# Patient Record
Sex: Female | Born: 1945 | Race: White | Hispanic: No | Smoking: Former smoker
Health system: Southern US, Community
[De-identification: ages and names within clinical notes are randomized; demographics above are authoritative.]

## PROBLEM LIST (undated history)

## (undated) DIAGNOSIS — K449 Diaphragmatic hernia without obstruction or gangrene: Secondary | ICD-10-CM

## (undated) DIAGNOSIS — E785 Hyperlipidemia, unspecified: Secondary | ICD-10-CM

## (undated) DIAGNOSIS — K902 Blind loop syndrome, not elsewhere classified: Secondary | ICD-10-CM

## (undated) DIAGNOSIS — G4730 Sleep apnea, unspecified: Secondary | ICD-10-CM

## (undated) DIAGNOSIS — I739 Peripheral vascular disease, unspecified: Secondary | ICD-10-CM

## (undated) DIAGNOSIS — E041 Nontoxic single thyroid nodule: Secondary | ICD-10-CM

## (undated) DIAGNOSIS — E538 Deficiency of other specified B group vitamins: Secondary | ICD-10-CM

## (undated) DIAGNOSIS — I10 Essential (primary) hypertension: Secondary | ICD-10-CM

## (undated) DIAGNOSIS — M1990 Unspecified osteoarthritis, unspecified site: Secondary | ICD-10-CM

## (undated) DIAGNOSIS — F1910 Other psychoactive substance abuse, uncomplicated: Secondary | ICD-10-CM

## (undated) DIAGNOSIS — R160 Hepatomegaly, not elsewhere classified: Secondary | ICD-10-CM

## (undated) DIAGNOSIS — T7840XA Allergy, unspecified, initial encounter: Secondary | ICD-10-CM

## (undated) DIAGNOSIS — D689 Coagulation defect, unspecified: Secondary | ICD-10-CM

## (undated) DIAGNOSIS — K861 Other chronic pancreatitis: Secondary | ICD-10-CM

## (undated) DIAGNOSIS — K5790 Diverticulosis of intestine, part unspecified, without perforation or abscess without bleeding: Secondary | ICD-10-CM

## (undated) DIAGNOSIS — I2510 Atherosclerotic heart disease of native coronary artery without angina pectoris: Secondary | ICD-10-CM

## (undated) DIAGNOSIS — K219 Gastro-esophageal reflux disease without esophagitis: Secondary | ICD-10-CM

## (undated) DIAGNOSIS — K760 Fatty (change of) liver, not elsewhere classified: Secondary | ICD-10-CM

## (undated) DIAGNOSIS — I779 Disorder of arteries and arterioles, unspecified: Secondary | ICD-10-CM

## (undated) DIAGNOSIS — F419 Anxiety disorder, unspecified: Secondary | ICD-10-CM

## (undated) DIAGNOSIS — Z933 Colostomy status: Secondary | ICD-10-CM

## (undated) DIAGNOSIS — M129 Arthropathy, unspecified: Secondary | ICD-10-CM

## (undated) DIAGNOSIS — R011 Cardiac murmur, unspecified: Secondary | ICD-10-CM

## (undated) DIAGNOSIS — F32A Depression, unspecified: Secondary | ICD-10-CM

## (undated) DIAGNOSIS — R569 Unspecified convulsions: Secondary | ICD-10-CM

## (undated) DIAGNOSIS — D649 Anemia, unspecified: Secondary | ICD-10-CM

## (undated) DIAGNOSIS — I639 Cerebral infarction, unspecified: Secondary | ICD-10-CM

## (undated) DIAGNOSIS — R42 Dizziness and giddiness: Secondary | ICD-10-CM

## (undated) DIAGNOSIS — E232 Diabetes insipidus: Secondary | ICD-10-CM

## (undated) DIAGNOSIS — K6389 Other specified diseases of intestine: Secondary | ICD-10-CM

## (undated) DIAGNOSIS — K589 Irritable bowel syndrome without diarrhea: Secondary | ICD-10-CM

## (undated) DIAGNOSIS — F329 Major depressive disorder, single episode, unspecified: Secondary | ICD-10-CM

## (undated) DIAGNOSIS — R0600 Dyspnea, unspecified: Secondary | ICD-10-CM

## (undated) DIAGNOSIS — IMO0001 Reserved for inherently not codable concepts without codable children: Secondary | ICD-10-CM

## (undated) DIAGNOSIS — S92309A Fracture of unspecified metatarsal bone(s), unspecified foot, initial encounter for closed fracture: Secondary | ICD-10-CM

## (undated) DIAGNOSIS — H269 Unspecified cataract: Secondary | ICD-10-CM

## (undated) HISTORY — DX: Coagulation defect, unspecified: D68.9

## (undated) HISTORY — DX: Nontoxic single thyroid nodule: E04.1

## (undated) HISTORY — DX: Gastro-esophageal reflux disease without esophagitis: K21.9

## (undated) HISTORY — DX: Major depressive disorder, single episode, unspecified: F32.9

## (undated) HISTORY — DX: Diaphragmatic hernia without obstruction or gangrene: K44.9

## (undated) HISTORY — DX: Cerebral infarction, unspecified: I63.9

## (undated) HISTORY — PX: PATELLA REALIGNMENT: SHX2179

## (undated) HISTORY — DX: Diabetes insipidus: E23.2

## (undated) HISTORY — PX: SIGMOIDOSCOPY: SUR1295

## (undated) HISTORY — PX: WRIST SURGERY: SHX841

## (undated) HISTORY — PX: POLYPECTOMY: SHX149

## (undated) HISTORY — DX: Essential (primary) hypertension: I10

## (undated) HISTORY — DX: Deficiency of other specified B group vitamins: E53.8

## (undated) HISTORY — PX: COLONOSCOPY: SHX174

## (undated) HISTORY — DX: Blind loop syndrome, not elsewhere classified: K90.2

## (undated) HISTORY — DX: Diverticulosis of intestine, part unspecified, without perforation or abscess without bleeding: K57.90

## (undated) HISTORY — DX: Arthropathy, unspecified: M12.9

## (undated) HISTORY — DX: Hyperlipidemia, unspecified: E78.5

## (undated) HISTORY — DX: Irritable bowel syndrome, unspecified: K58.9

## (undated) HISTORY — PX: AUGMENTATION MAMMAPLASTY: SUR837

## (undated) HISTORY — DX: Allergy, unspecified, initial encounter: T78.40XA

## (undated) HISTORY — PX: BREAST ENHANCEMENT SURGERY: SHX7

## (undated) HISTORY — PX: CATARACT EXTRACTION: SUR2

## (undated) HISTORY — DX: Dizziness and giddiness: R42

## (undated) HISTORY — DX: Colostomy status: Z93.3

## (undated) HISTORY — DX: Peripheral vascular disease, unspecified: I73.9

## (undated) HISTORY — PX: CARDIAC CATHETERIZATION: SHX172

## (undated) HISTORY — DX: Other psychoactive substance abuse, uncomplicated: F19.10

## (undated) HISTORY — PX: TOTAL ABDOMINAL HYSTERECTOMY: SHX209

## (undated) HISTORY — DX: Fatty (change of) liver, not elsewhere classified: K76.0

## (undated) HISTORY — DX: Unspecified osteoarthritis, unspecified site: M19.90

## (undated) HISTORY — DX: Anemia, unspecified: D64.9

## (undated) HISTORY — DX: Unspecified cataract: H26.9

## (undated) HISTORY — DX: Depression, unspecified: F32.A

## (undated) HISTORY — DX: Cardiac murmur, unspecified: R01.1

## (undated) HISTORY — DX: Hepatomegaly, not elsewhere classified: R16.0

## (undated) HISTORY — DX: Fracture of unspecified metatarsal bone(s), unspecified foot, initial encounter for closed fracture: S92.309A

## (undated) HISTORY — DX: Atherosclerotic heart disease of native coronary artery without angina pectoris: I25.10

## (undated) HISTORY — DX: Other specified diseases of intestine: K63.89

## (undated) HISTORY — DX: Other chronic pancreatitis: K86.1

## (undated) HISTORY — DX: Disorder of arteries and arterioles, unspecified: I77.9

## (undated) HISTORY — DX: Reserved for inherently not codable concepts without codable children: IMO0001

## (undated) HISTORY — PX: TONSILLECTOMY: SUR1361

## (undated) HISTORY — PX: ABDOMINAL HYSTERECTOMY: SHX81

## (undated) HISTORY — PX: FEMORAL ARTERY STENT: SHX1583

---

## 1998-07-07 ENCOUNTER — Ambulatory Visit (HOSPITAL_COMMUNITY): Admission: RE | Admit: 1998-07-07 | Discharge: 1998-07-07 | Payer: Self-pay | Admitting: Cardiology

## 1998-07-11 ENCOUNTER — Emergency Department (HOSPITAL_COMMUNITY): Admission: EM | Admit: 1998-07-11 | Discharge: 1998-07-11 | Payer: Self-pay | Admitting: Emergency Medicine

## 1998-09-18 ENCOUNTER — Encounter: Payer: Self-pay | Admitting: Obstetrics and Gynecology

## 1998-09-18 ENCOUNTER — Ambulatory Visit (HOSPITAL_COMMUNITY): Admission: RE | Admit: 1998-09-18 | Discharge: 1998-09-18 | Payer: Self-pay | Admitting: Obstetrics and Gynecology

## 1998-11-02 ENCOUNTER — Ambulatory Visit (HOSPITAL_COMMUNITY): Admission: RE | Admit: 1998-11-02 | Discharge: 1998-11-02 | Payer: Self-pay | Admitting: Otolaryngology

## 1998-11-02 ENCOUNTER — Encounter: Payer: Self-pay | Admitting: Otolaryngology

## 1999-01-10 ENCOUNTER — Emergency Department (HOSPITAL_COMMUNITY): Admission: EM | Admit: 1999-01-10 | Discharge: 1999-01-10 | Payer: Self-pay | Admitting: Emergency Medicine

## 1999-01-10 ENCOUNTER — Encounter: Payer: Self-pay | Admitting: Emergency Medicine

## 1999-10-15 HISTORY — PX: EYE SURGERY: SHX253

## 1999-10-17 ENCOUNTER — Encounter: Payer: Self-pay | Admitting: Cardiology

## 1999-10-17 ENCOUNTER — Ambulatory Visit (HOSPITAL_COMMUNITY): Admission: RE | Admit: 1999-10-17 | Discharge: 1999-10-17 | Payer: Self-pay | Admitting: Cardiology

## 2000-09-19 ENCOUNTER — Other Ambulatory Visit: Admission: RE | Admit: 2000-09-19 | Discharge: 2000-09-19 | Payer: Self-pay | Admitting: Obstetrics and Gynecology

## 2000-10-20 ENCOUNTER — Ambulatory Visit (HOSPITAL_COMMUNITY): Admission: RE | Admit: 2000-10-20 | Discharge: 2000-10-20 | Payer: Self-pay | Admitting: Obstetrics and Gynecology

## 2000-10-20 ENCOUNTER — Encounter: Payer: Self-pay | Admitting: Obstetrics and Gynecology

## 2000-11-11 ENCOUNTER — Ambulatory Visit (HOSPITAL_COMMUNITY): Admission: RE | Admit: 2000-11-11 | Discharge: 2000-11-11 | Payer: Self-pay | Admitting: Orthopedic Surgery

## 2001-05-10 ENCOUNTER — Ambulatory Visit (HOSPITAL_BASED_OUTPATIENT_CLINIC_OR_DEPARTMENT_OTHER): Admission: RE | Admit: 2001-05-10 | Discharge: 2001-05-10 | Payer: Self-pay | Admitting: Internal Medicine

## 2001-05-25 ENCOUNTER — Ambulatory Visit (HOSPITAL_COMMUNITY): Admission: RE | Admit: 2001-05-25 | Discharge: 2001-05-25 | Payer: Self-pay | Admitting: Gastroenterology

## 2001-08-16 ENCOUNTER — Emergency Department (HOSPITAL_COMMUNITY): Admission: EM | Admit: 2001-08-16 | Discharge: 2001-08-16 | Payer: Self-pay

## 2001-08-18 ENCOUNTER — Ambulatory Visit (HOSPITAL_COMMUNITY): Admission: RE | Admit: 2001-08-18 | Discharge: 2001-08-18 | Payer: Self-pay | Admitting: Gastroenterology

## 2001-08-28 ENCOUNTER — Inpatient Hospital Stay (HOSPITAL_COMMUNITY): Admission: EM | Admit: 2001-08-28 | Discharge: 2001-08-31 | Payer: Self-pay

## 2001-09-08 ENCOUNTER — Encounter: Payer: Self-pay | Admitting: Gastroenterology

## 2001-09-08 ENCOUNTER — Ambulatory Visit (HOSPITAL_COMMUNITY): Admission: RE | Admit: 2001-09-08 | Discharge: 2001-09-08 | Payer: Self-pay | Admitting: Gastroenterology

## 2001-09-17 ENCOUNTER — Encounter: Payer: Self-pay | Admitting: Gastroenterology

## 2001-09-17 ENCOUNTER — Ambulatory Visit (HOSPITAL_COMMUNITY): Admission: RE | Admit: 2001-09-17 | Discharge: 2001-09-17 | Payer: Self-pay | Admitting: Gastroenterology

## 2001-09-21 ENCOUNTER — Other Ambulatory Visit: Admission: RE | Admit: 2001-09-21 | Discharge: 2001-09-21 | Payer: Self-pay | Admitting: Obstetrics and Gynecology

## 2002-07-23 ENCOUNTER — Encounter: Payer: Self-pay | Admitting: Obstetrics and Gynecology

## 2002-07-23 ENCOUNTER — Ambulatory Visit (HOSPITAL_COMMUNITY): Admission: RE | Admit: 2002-07-23 | Discharge: 2002-07-23 | Payer: Self-pay | Admitting: Obstetrics and Gynecology

## 2002-08-02 ENCOUNTER — Ambulatory Visit (HOSPITAL_COMMUNITY): Admission: RE | Admit: 2002-08-02 | Discharge: 2002-08-02 | Payer: Self-pay | Admitting: Gastroenterology

## 2002-08-02 ENCOUNTER — Encounter: Payer: Self-pay | Admitting: Gastroenterology

## 2002-12-01 ENCOUNTER — Encounter: Payer: Self-pay | Admitting: Gastroenterology

## 2002-12-01 ENCOUNTER — Ambulatory Visit (HOSPITAL_COMMUNITY): Admission: RE | Admit: 2002-12-01 | Discharge: 2002-12-01 | Payer: Self-pay | Admitting: Gastroenterology

## 2004-02-26 ENCOUNTER — Emergency Department (HOSPITAL_COMMUNITY): Admission: EM | Admit: 2004-02-26 | Discharge: 2004-02-26 | Payer: Self-pay | Admitting: Family Medicine

## 2004-04-20 ENCOUNTER — Emergency Department (HOSPITAL_COMMUNITY): Admission: EM | Admit: 2004-04-20 | Discharge: 2004-04-20 | Payer: Self-pay | Admitting: Family Medicine

## 2004-11-14 ENCOUNTER — Encounter: Admission: RE | Admit: 2004-11-14 | Discharge: 2004-11-14 | Payer: Self-pay | Admitting: Internal Medicine

## 2005-03-04 ENCOUNTER — Inpatient Hospital Stay (HOSPITAL_COMMUNITY): Admission: EM | Admit: 2005-03-04 | Discharge: 2005-03-06 | Payer: Self-pay | Admitting: Emergency Medicine

## 2005-03-20 ENCOUNTER — Ambulatory Visit: Payer: Self-pay | Admitting: Family Medicine

## 2005-03-21 ENCOUNTER — Ambulatory Visit: Payer: Self-pay | Admitting: *Deleted

## 2005-07-12 ENCOUNTER — Ambulatory Visit (HOSPITAL_COMMUNITY): Admission: RE | Admit: 2005-07-12 | Discharge: 2005-07-12 | Payer: Self-pay | Admitting: Internal Medicine

## 2005-10-15 ENCOUNTER — Ambulatory Visit: Payer: Self-pay | Admitting: Gastroenterology

## 2005-10-15 ENCOUNTER — Encounter: Admission: RE | Admit: 2005-10-15 | Discharge: 2005-10-15 | Payer: Self-pay | Admitting: Internal Medicine

## 2005-11-07 ENCOUNTER — Ambulatory Visit: Payer: Self-pay | Admitting: Gastroenterology

## 2005-12-09 ENCOUNTER — Emergency Department (HOSPITAL_COMMUNITY): Admission: EM | Admit: 2005-12-09 | Discharge: 2005-12-09 | Payer: Self-pay | Admitting: Family Medicine

## 2005-12-19 ENCOUNTER — Ambulatory Visit: Payer: Self-pay | Admitting: Gastroenterology

## 2005-12-24 ENCOUNTER — Ambulatory Visit: Payer: Self-pay | Admitting: Gastroenterology

## 2005-12-27 ENCOUNTER — Ambulatory Visit: Payer: Self-pay | Admitting: Gastroenterology

## 2006-01-02 ENCOUNTER — Ambulatory Visit: Payer: Self-pay | Admitting: Gastroenterology

## 2006-02-04 ENCOUNTER — Ambulatory Visit: Payer: Self-pay | Admitting: Gastroenterology

## 2006-09-08 ENCOUNTER — Encounter: Admission: RE | Admit: 2006-09-08 | Discharge: 2006-09-08 | Payer: Self-pay | Admitting: Internal Medicine

## 2006-09-10 ENCOUNTER — Encounter: Admission: RE | Admit: 2006-09-10 | Discharge: 2006-09-10 | Payer: Self-pay | Admitting: Internal Medicine

## 2007-03-02 ENCOUNTER — Encounter: Admission: RE | Admit: 2007-03-02 | Discharge: 2007-03-02 | Payer: Self-pay | Admitting: Internal Medicine

## 2007-03-26 ENCOUNTER — Other Ambulatory Visit: Admission: RE | Admit: 2007-03-26 | Discharge: 2007-03-26 | Payer: Self-pay | Admitting: Diagnostic Radiology

## 2007-03-26 ENCOUNTER — Encounter (INDEPENDENT_AMBULATORY_CARE_PROVIDER_SITE_OTHER): Payer: Self-pay | Admitting: Diagnostic Radiology

## 2007-03-26 ENCOUNTER — Encounter: Admission: RE | Admit: 2007-03-26 | Discharge: 2007-03-26 | Payer: Self-pay | Admitting: Otolaryngology

## 2007-04-15 ENCOUNTER — Ambulatory Visit (HOSPITAL_COMMUNITY): Admission: RE | Admit: 2007-04-15 | Discharge: 2007-04-15 | Payer: Self-pay | Admitting: Internal Medicine

## 2007-04-22 ENCOUNTER — Encounter: Admission: RE | Admit: 2007-04-22 | Discharge: 2007-04-22 | Payer: Self-pay | Admitting: Internal Medicine

## 2007-05-05 ENCOUNTER — Encounter: Admission: RE | Admit: 2007-05-05 | Discharge: 2007-05-05 | Payer: Self-pay | Admitting: Internal Medicine

## 2007-05-18 ENCOUNTER — Ambulatory Visit: Payer: Self-pay | Admitting: Surgery

## 2007-05-21 ENCOUNTER — Encounter: Admission: RE | Admit: 2007-05-21 | Discharge: 2007-05-21 | Payer: Self-pay | Admitting: Internal Medicine

## 2007-06-02 ENCOUNTER — Ambulatory Visit: Payer: Self-pay | Admitting: Vascular Surgery

## 2007-06-04 ENCOUNTER — Encounter: Admission: RE | Admit: 2007-06-04 | Discharge: 2007-06-04 | Payer: Self-pay | Admitting: Internal Medicine

## 2007-06-05 ENCOUNTER — Ambulatory Visit (HOSPITAL_COMMUNITY): Admission: RE | Admit: 2007-06-05 | Discharge: 2007-06-05 | Payer: Self-pay | Admitting: Vascular Surgery

## 2007-06-05 ENCOUNTER — Ambulatory Visit: Payer: Self-pay | Admitting: Vascular Surgery

## 2007-06-19 ENCOUNTER — Encounter: Admission: RE | Admit: 2007-06-19 | Discharge: 2007-06-19 | Payer: Self-pay | Admitting: Internal Medicine

## 2007-07-28 ENCOUNTER — Ambulatory Visit: Payer: Self-pay | Admitting: Gastroenterology

## 2007-08-06 ENCOUNTER — Ambulatory Visit (HOSPITAL_COMMUNITY): Admission: RE | Admit: 2007-08-06 | Discharge: 2007-08-06 | Payer: Self-pay | Admitting: Gastroenterology

## 2007-10-01 ENCOUNTER — Ambulatory Visit: Payer: Self-pay | Admitting: Gastroenterology

## 2007-10-01 LAB — CONVERTED CEMR LAB
ALT: 18 units/L (ref 0–35)
Albumin: 3.5 g/dL (ref 3.5–5.2)
Alkaline Phosphatase: 98 units/L (ref 39–117)
BUN: 14 mg/dL (ref 6–23)
Basophils Relative: 0 % (ref 0.0–1.0)
CO2: 25 meq/L (ref 19–32)
Calcium: 9.2 mg/dL (ref 8.4–10.5)
Chloride: 104 meq/L (ref 96–112)
Creatinine, Ser: 1.1 mg/dL (ref 0.4–1.2)
Eosinophils Relative: 2.7 % (ref 0.0–5.0)
Ferritin: 28.1 ng/mL (ref 10.0–291.0)
Folate: 8.8 ng/mL
HCT: 39.9 % (ref 36.0–46.0)
INR: 1 (ref 0.8–1.0)
Lipase: 29 units/L (ref 11.0–59.0)
MCV: 93.3 fL (ref 78.0–100.0)
Neutrophils Relative %: 58.2 % (ref 43.0–77.0)
Platelets: 360 10*3/uL (ref 150–400)
Prothrombin Time: 12.4 s (ref 10.9–13.3)
RBC: 4.28 M/uL (ref 3.87–5.11)
RDW: 12.1 % (ref 11.5–14.6)
Saturation Ratios: 23.6 % (ref 20.0–50.0)
Sed Rate: 46 mm/hr — ABNORMAL HIGH (ref 0–25)
TSH: 1.3 microintl units/mL (ref 0.35–5.50)
Total Bilirubin: 0.7 mg/dL (ref 0.3–1.2)
Total Protein: 6.8 g/dL (ref 6.0–8.3)
Transferrin: 311.9 mg/dL (ref >=212.0)
WBC: 12 10*3/uL — ABNORMAL HIGH (ref 4.5–10.5)

## 2007-10-06 ENCOUNTER — Encounter: Admission: RE | Admit: 2007-10-06 | Discharge: 2007-10-06 | Payer: Self-pay | Admitting: Otolaryngology

## 2007-10-07 ENCOUNTER — Ambulatory Visit (HOSPITAL_COMMUNITY): Admission: RE | Admit: 2007-10-07 | Discharge: 2007-10-07 | Payer: Self-pay | Admitting: Gastroenterology

## 2007-10-13 ENCOUNTER — Ambulatory Visit: Payer: Self-pay | Admitting: Gastroenterology

## 2007-10-19 ENCOUNTER — Ambulatory Visit: Payer: Self-pay | Admitting: Gastroenterology

## 2007-10-19 ENCOUNTER — Encounter: Payer: Self-pay | Admitting: Gastroenterology

## 2007-11-10 ENCOUNTER — Ambulatory Visit: Payer: Self-pay | Admitting: Gastroenterology

## 2007-12-17 DIAGNOSIS — K219 Gastro-esophageal reflux disease without esophagitis: Secondary | ICD-10-CM | POA: Insufficient documentation

## 2007-12-17 DIAGNOSIS — K589 Irritable bowel syndrome without diarrhea: Secondary | ICD-10-CM | POA: Insufficient documentation

## 2007-12-17 DIAGNOSIS — K449 Diaphragmatic hernia without obstruction or gangrene: Secondary | ICD-10-CM | POA: Insufficient documentation

## 2007-12-17 DIAGNOSIS — F1910 Other psychoactive substance abuse, uncomplicated: Secondary | ICD-10-CM | POA: Insufficient documentation

## 2007-12-17 DIAGNOSIS — M129 Arthropathy, unspecified: Secondary | ICD-10-CM | POA: Insufficient documentation

## 2007-12-17 DIAGNOSIS — I10 Essential (primary) hypertension: Secondary | ICD-10-CM | POA: Insufficient documentation

## 2007-12-17 DIAGNOSIS — K6389 Other specified diseases of intestine: Secondary | ICD-10-CM | POA: Insufficient documentation

## 2007-12-17 DIAGNOSIS — R160 Hepatomegaly, not elsewhere classified: Secondary | ICD-10-CM | POA: Insufficient documentation

## 2007-12-29 ENCOUNTER — Ambulatory Visit: Payer: Self-pay | Admitting: Gastroenterology

## 2008-01-04 ENCOUNTER — Ambulatory Visit (HOSPITAL_COMMUNITY): Admission: RE | Admit: 2008-01-04 | Discharge: 2008-01-04 | Payer: Self-pay | Admitting: Gastroenterology

## 2008-02-16 ENCOUNTER — Ambulatory Visit: Payer: Self-pay | Admitting: Gastroenterology

## 2008-03-30 ENCOUNTER — Ambulatory Visit: Payer: Self-pay | Admitting: Gastroenterology

## 2008-03-31 ENCOUNTER — Encounter: Payer: Self-pay | Admitting: Gastroenterology

## 2008-05-20 ENCOUNTER — Ambulatory Visit (HOSPITAL_COMMUNITY): Admission: RE | Admit: 2008-05-20 | Discharge: 2008-05-20 | Payer: Self-pay | Admitting: Internal Medicine

## 2008-05-23 ENCOUNTER — Telehealth: Payer: Self-pay | Admitting: Gastroenterology

## 2008-05-30 ENCOUNTER — Encounter: Payer: Self-pay | Admitting: Gastroenterology

## 2008-07-05 ENCOUNTER — Encounter: Admission: RE | Admit: 2008-07-05 | Discharge: 2008-07-05 | Payer: Self-pay | Admitting: Otolaryngology

## 2008-08-05 ENCOUNTER — Telehealth: Payer: Self-pay | Admitting: Gastroenterology

## 2008-12-16 ENCOUNTER — Encounter: Admission: RE | Admit: 2008-12-16 | Discharge: 2008-12-16 | Payer: Self-pay | Admitting: Internal Medicine

## 2008-12-22 ENCOUNTER — Encounter: Payer: Self-pay | Admitting: Gastroenterology

## 2009-05-02 ENCOUNTER — Ambulatory Visit: Payer: Self-pay | Admitting: Vascular Surgery

## 2009-05-30 ENCOUNTER — Ambulatory Visit (HOSPITAL_COMMUNITY): Admission: RE | Admit: 2009-05-30 | Discharge: 2009-05-30 | Payer: Self-pay | Admitting: Internal Medicine

## 2009-06-26 ENCOUNTER — Encounter: Payer: Self-pay | Admitting: Gastroenterology

## 2009-10-17 ENCOUNTER — Ambulatory Visit: Payer: Self-pay | Admitting: Gastroenterology

## 2009-10-17 ENCOUNTER — Encounter: Admission: RE | Admit: 2009-10-17 | Discharge: 2009-10-17 | Payer: Self-pay | Admitting: Otolaryngology

## 2009-11-16 DIAGNOSIS — M654 Radial styloid tenosynovitis [de Quervain]: Secondary | ICD-10-CM | POA: Insufficient documentation

## 2009-11-16 DIAGNOSIS — E785 Hyperlipidemia, unspecified: Secondary | ICD-10-CM | POA: Insufficient documentation

## 2009-11-16 DIAGNOSIS — G4733 Obstructive sleep apnea (adult) (pediatric): Secondary | ICD-10-CM | POA: Insufficient documentation

## 2009-11-16 DIAGNOSIS — I25810 Atherosclerosis of coronary artery bypass graft(s) without angina pectoris: Secondary | ICD-10-CM | POA: Insufficient documentation

## 2009-11-16 DIAGNOSIS — I2510 Atherosclerotic heart disease of native coronary artery without angina pectoris: Secondary | ICD-10-CM | POA: Insufficient documentation

## 2009-11-16 DIAGNOSIS — I739 Peripheral vascular disease, unspecified: Secondary | ICD-10-CM | POA: Insufficient documentation

## 2009-11-16 DIAGNOSIS — E041 Nontoxic single thyroid nodule: Secondary | ICD-10-CM | POA: Insufficient documentation

## 2009-11-16 DIAGNOSIS — K3184 Gastroparesis: Secondary | ICD-10-CM | POA: Insufficient documentation

## 2009-11-16 DIAGNOSIS — M545 Low back pain, unspecified: Secondary | ICD-10-CM | POA: Insufficient documentation

## 2009-11-16 DIAGNOSIS — F17200 Nicotine dependence, unspecified, uncomplicated: Secondary | ICD-10-CM | POA: Insufficient documentation

## 2009-11-17 DIAGNOSIS — R05 Cough: Secondary | ICD-10-CM

## 2009-11-17 DIAGNOSIS — B070 Plantar wart: Secondary | ICD-10-CM | POA: Insufficient documentation

## 2009-11-17 DIAGNOSIS — R059 Cough, unspecified: Secondary | ICD-10-CM | POA: Insufficient documentation

## 2010-02-06 ENCOUNTER — Ambulatory Visit: Payer: Self-pay | Admitting: Vascular Surgery

## 2010-02-20 ENCOUNTER — Ambulatory Visit (HOSPITAL_COMMUNITY): Admission: RE | Admit: 2010-02-20 | Discharge: 2010-02-20 | Payer: Self-pay | Admitting: Surgery

## 2010-02-20 ENCOUNTER — Ambulatory Visit: Payer: Self-pay | Admitting: Surgery

## 2010-02-20 DIAGNOSIS — I739 Peripheral vascular disease, unspecified: Secondary | ICD-10-CM

## 2010-02-20 HISTORY — DX: Peripheral vascular disease, unspecified: I73.9

## 2010-02-23 DIAGNOSIS — F329 Major depressive disorder, single episode, unspecified: Secondary | ICD-10-CM | POA: Insufficient documentation

## 2010-02-23 DIAGNOSIS — F32A Depression, unspecified: Secondary | ICD-10-CM | POA: Insufficient documentation

## 2010-03-16 ENCOUNTER — Ambulatory Visit: Payer: Self-pay | Admitting: Gastroenterology

## 2010-03-16 DIAGNOSIS — I82409 Acute embolism and thrombosis of unspecified deep veins of unspecified lower extremity: Secondary | ICD-10-CM | POA: Insufficient documentation

## 2010-03-16 DIAGNOSIS — R197 Diarrhea, unspecified: Secondary | ICD-10-CM | POA: Insufficient documentation

## 2010-03-16 DIAGNOSIS — K902 Blind loop syndrome, not elsewhere classified: Secondary | ICD-10-CM | POA: Insufficient documentation

## 2010-03-19 ENCOUNTER — Ambulatory Visit: Payer: Self-pay | Admitting: Gastroenterology

## 2010-03-19 DIAGNOSIS — E538 Deficiency of other specified B group vitamins: Secondary | ICD-10-CM | POA: Insufficient documentation

## 2010-03-19 LAB — CONVERTED CEMR LAB
Amylase: 31 units/L (ref 27–131)
Iron: 93 ug/dL (ref 42–145)
Lipase: <10 units/L — ABNORMAL LOW (ref 11.0–59.0)
Magnesium: 1.9 mg/dL (ref 1.5–2.5)
Transferrin: 298.8 mg/dL (ref 212.0–360.0)
Vitamin B-12: 188 pg/mL — ABNORMAL LOW (ref 211–911)

## 2010-03-21 LAB — CONVERTED CEMR LAB: Tissue Transglutaminase Ab, IgA: 3.7 units (ref <20)

## 2010-03-22 ENCOUNTER — Ambulatory Visit: Payer: Self-pay | Admitting: Vascular Surgery

## 2010-03-27 ENCOUNTER — Ambulatory Visit: Payer: Self-pay | Admitting: Gastroenterology

## 2010-04-02 ENCOUNTER — Telehealth: Payer: Self-pay | Admitting: Gastroenterology

## 2010-04-03 ENCOUNTER — Encounter: Payer: Self-pay | Admitting: Gastroenterology

## 2010-04-10 ENCOUNTER — Ambulatory Visit: Payer: Self-pay | Admitting: Gastroenterology

## 2010-04-19 ENCOUNTER — Encounter: Payer: Self-pay | Admitting: Gastroenterology

## 2010-04-25 ENCOUNTER — Telehealth: Payer: Self-pay | Admitting: Gastroenterology

## 2010-05-01 ENCOUNTER — Telehealth: Payer: Self-pay | Admitting: Gastroenterology

## 2010-05-21 ENCOUNTER — Ambulatory Visit: Payer: Self-pay | Admitting: Gastroenterology

## 2010-05-30 DIAGNOSIS — K8689 Other specified diseases of pancreas: Secondary | ICD-10-CM | POA: Insufficient documentation

## 2010-06-06 ENCOUNTER — Ambulatory Visit (HOSPITAL_COMMUNITY): Admission: RE | Admit: 2010-06-06 | Discharge: 2010-06-06 | Payer: Self-pay | Admitting: Internal Medicine

## 2010-06-21 ENCOUNTER — Encounter (INDEPENDENT_AMBULATORY_CARE_PROVIDER_SITE_OTHER): Payer: Self-pay | Admitting: *Deleted

## 2010-06-21 ENCOUNTER — Ambulatory Visit: Payer: Self-pay | Admitting: Gastroenterology

## 2010-06-25 ENCOUNTER — Telehealth: Payer: Self-pay | Admitting: Gastroenterology

## 2010-07-03 ENCOUNTER — Ambulatory Visit: Payer: Self-pay | Admitting: Cardiology

## 2010-07-11 DIAGNOSIS — Z2839 Other underimmunization status: Secondary | ICD-10-CM | POA: Insufficient documentation

## 2010-07-11 DIAGNOSIS — Z283 Underimmunization status: Secondary | ICD-10-CM

## 2010-07-11 DIAGNOSIS — R110 Nausea: Secondary | ICD-10-CM | POA: Insufficient documentation

## 2010-07-23 ENCOUNTER — Ambulatory Visit: Payer: Self-pay | Admitting: Gastroenterology

## 2010-08-01 ENCOUNTER — Ambulatory Visit: Payer: Self-pay | Admitting: Gastroenterology

## 2010-08-03 ENCOUNTER — Encounter: Payer: Self-pay | Admitting: Gastroenterology

## 2010-08-21 ENCOUNTER — Ambulatory Visit: Payer: Self-pay | Admitting: Gastroenterology

## 2010-09-03 ENCOUNTER — Ambulatory Visit: Payer: Self-pay | Admitting: Vascular Surgery

## 2010-09-21 ENCOUNTER — Ambulatory Visit: Payer: Self-pay | Admitting: Gastroenterology

## 2010-10-19 ENCOUNTER — Ambulatory Visit
Admission: RE | Admit: 2010-10-19 | Discharge: 2010-10-19 | Payer: Self-pay | Source: Home / Self Care | Attending: Gastroenterology | Admitting: Gastroenterology

## 2010-11-04 ENCOUNTER — Encounter (HOSPITAL_BASED_OUTPATIENT_CLINIC_OR_DEPARTMENT_OTHER): Payer: Self-pay | Admitting: Internal Medicine

## 2010-11-08 ENCOUNTER — Encounter: Payer: Self-pay | Admitting: Cardiology

## 2010-11-08 DIAGNOSIS — E785 Hyperlipidemia, unspecified: Secondary | ICD-10-CM | POA: Insufficient documentation

## 2010-11-08 DIAGNOSIS — I6521 Occlusion and stenosis of right carotid artery: Secondary | ICD-10-CM | POA: Insufficient documentation

## 2010-11-08 DIAGNOSIS — IMO0001 Reserved for inherently not codable concepts without codable children: Secondary | ICD-10-CM | POA: Insufficient documentation

## 2010-11-08 DIAGNOSIS — R42 Dizziness and giddiness: Secondary | ICD-10-CM | POA: Insufficient documentation

## 2010-11-13 NOTE — Letter (Signed)
Summary: Patient Notice- Colon Biospy Results  [facility]  [address] [place], [place] [address]   Phone: [phone]  Fax: [phone]        August 03, 2010 MRN: [id number]    [name] [address] [place], [place]  [address]    Dear Ms. Gerlean Ren,  I am pleased to inform you that the biopsies taken during your recent colonoscopy did not show any evidence of cancer upon pathologic examination.  Additional information/recommendations:  __No further action is needed at this time.  Please follow-up with      your primary care physician for your other healthcare needs.  __Please call [phone] to schedule a return visit to review      your condition.  _X_Continue with the treatment plan as outlined on the day of your      exam.  __You should have a repeat colonoscopy examination for this problem           in 5_ years.  Please call us if you are having persistent problems or have questions about your condition that have not been fully answered at this time.  Sincerely,  Sable Feil MD [facility]   This letter has been electronically signed by your physician.  Appended Document: Patient Notice- Colon Biospy Results letter mailed

## 2010-11-13 NOTE — Assessment & Plan Note (Signed)
Summary: #2 of 3 weekly B12/dfs  Nurse Visit   Allergies: No Known Drug Allergies  Medication Administration  Injection # 1:    Medication: Vit B12 1000 mcg    Diagnosis: B12 DEFICIENCY (ICD-266.2)    Route: IM    Site: R deltoid    Exp Date: 11/15/2011    Lot #: [id number]    Mfr: American Regent    Patient tolerated injection without complications    Given by: Marlon Pel CMA Deborra Medina) (March 27, 2010 2:32 PM)  Orders Added: 1)  Vit B12 1000 mcg [id number]

## 2010-11-13 NOTE — Assessment & Plan Note (Signed)
Summary: 3 WEEK F/U   History of Present Illness Visit Type: Follow-up Visit Primary GI MD: Verl Blalock MD FACP [place] Primary Provider: Leanna Battles, MD Requesting Provider: na Chief Complaint: B12 injection, patient is fatigued  History of Present Illness:   [name] is doing better with her diarrhea but continued with gas and bloating. She has completed 2 week course of metronidazole and probiotic therapy. 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 She is on greater than 20 medications reviewed today as best as possible. Despite the above problems she has had no anorexia, weight loss, or systemic complaints. She is chronically on probiotic therapy, aspirin, and Plavix. It is unclear after reviewing her meds exactly which medication she takes regularly.   GI Review of Systems    Reports bloating and  nausea.      Denies abdominal pain, acid reflux, belching, chest pain, dysphagia with liquids, dysphagia with solids, heartburn, loss of appetite, vomiting, vomiting blood, weight loss, and  weight gain.        Denies anal fissure, black tarry stools, change in bowel habit, constipation, diarrhea, diverticulosis, fecal incontinence, heme positive stool, hemorrhoids, irritable bowel syndrome, jaundice, light color stool, liver problems, rectal bleeding, and  rectal pain.    Current Medications (verified): 1)  Norpramin 10 Mg  Tabs (Desipramine Hcl) .... Take One Teb At Bedtime 2)  Pamine Forte 5 Mg Tabs (Methscopolamine Bromide) .... Take 1 Tablet By Mouth Twice A Day 3)  Venlafaxine Hcl 150 Mg Xr24h-Cap (Venlafaxine Hcl) .... Once Daily 4)  Nexium 40 Mg Cpdr (Esomeprazole Magnesium) .... Once Daily 5)  Singulair 10 Mg Tabs (Montelukast Sodium) .... Once Daily 6)  Lisinopril 40 Mg Tabs (Lisinopril) .... Once Daily 7)  Furosemide 20 Mg Tabs (Furosemide) .... Once Daily 8)   Hydrocodone-Acetaminophen 5-500 Mg Tabs (Hydrocodone-Acetaminophen) .... As Needed 9)  Metoclopramide Hcl 5 Mg Tabs (Metoclopramide Hcl) .... Once Daily 10)  Amlodipine Besylate 10 Mg Tabs (Amlodipine Besylate) .... Once Daily 11)  Lipitor 20 Mg Tabs (Atorvastatin Calcium) .... Once Daily 12)  Zolpidem Tartrate 10 Mg Tabs (Zolpidem Tartrate) .... Once Daily 13)  Atenolol 50 Mg Tabs (Atenolol) .... Once Daily 14)  Topiramate 25 Mg Cpsp (Topiramate) .... Once Daily 15)  Gabapentin 300 Mg Caps (Gabapentin) .... Once Daily 16)  Probiotic Pearls  Caps (Probiotic Product) .... One Capsule By Mouth Once Daily 17)  Plavix 75 Mg Tabs (Clopidogrel Bisulfate) .... One Tablet By Mouth Once Daily 18)  Aspirin 81 Mg  Tabs (Aspirin) .... One Tablet By Mouth Once Daily 19)  Metronidazole 250 Mg Tabs (Metronidazole) .Marland Kitchen.. 1 Po Three Times A Day X 2 Weeks 20)  Align   Caps (Misc Intestinal Flora Regulat) .... Take One Capsule By Mouth Daily For 2 Weeks 21)  Cyanocobalamin 1000 Mcg/ml Inj Soln (Cyanocobalamin) .Marland Kitchen.. 1 Cc Im Weekly X3 Then Monthly.  Allergies (verified): No Known Drug Allergies  Past History:  Past medical, surgical, family and social histories (including risk factors) reviewed for relevance to current acute and chronic problems.  Past Medical History: Reviewed history from 12/17/2007 and no changes required. Current Problems:  ARTHRITIS (ICD-716.90) IBS (ICD-564.1) MELANOSIS COLI (ICD-569.89) HYPERTENSION (ICD-401.9) LAXATIVE ABUSE (ICD-305.90) DEPRESSION (ICD-311) HEPATOMEGALY (ICD-789.1) HIATAL HERNIA (ICD-553.3) FATTY LIVER DISEASE (ICD-571.8) GERD (ICD-530.81)  Past Surgical History: Reviewed history from 03/16/2010 and no changes required. left knee arthroscopy Hysterectomy Wrist surgery elbow Stent in leg  Family History: Reviewed history from 03/16/2010 and no changes required. Family History of Colon Cancer:  PGF  Social History: Reviewed history from 03/16/2010  and no changes required. Occupation: Retired Divorced  Patient currently smokes.  Alcohol Use - no Daily Caffeine Use: Diet Cokes daily  Illicit Drug Use - no  Review of Systems       The patient complains of allergy/sinus, anemia, anxiety-new, arthritis/joint pain, cough, depression-new, fatigue, headaches-new, heart murmur, sleeping problems, swelling of feet/legs, thirst - excessive, urine leakage, and voice change.  The patient denies back pain, blood in urine, breast changes/lumps, change in vision, confusion, coughing up blood, fainting, fever, hearing problems, heart rhythm changes, itching, menstrual pain, muscle pains/cramps, night sweats, nosebleeds, pregnancy symptoms, shortness of breath, skin rash, sore throat, swollen lymph glands, urination - excessive, urination changes/pain, and vision changes.    Vital Signs:  Patient profile:   65 year old female Height:      63 inches Weight:      194.25 pounds BMI:     34.53 Pulse rate:   68 / minute Pulse rhythm:   regular BP sitting:   110 / 70  (left arm) Cuff size:   regular  Vitals Entered By: June McMurray [place] Deborra Medina) (April 10, 2010 3:09 PM)  Physical Exam  General:  Well developed, well nourished, no acute distress.obese.  obese.   Head:  Normocephalic and atraumatic. Eyes:  PERRLA, no icterus.exam deferred to patient's ophthalmologist.  exam deferred to patient's ophthalmologist.   Psych:  Alert and cooperative. Normal mood and affect.depressed affect.  depressed affect.     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 Laboratory test [phone])  Problem # 2:  B12 DEFICIENCY (ICD-266.2) Assessment: Improved Supplement with weekly nasal B12 spray. This most likely is related to bacterial overgrowth syndrome.  Patient Instructions: 1)  Please go to the basement for lab instructions. 2)  Couninue monthly B12 injections or begin Nasal B12. 3)   Use Lomotil as needed for diarrhea.  A prescription will be sent to your pharmacy. 4)  The medication list was reviewed and reconciled.  All changed / newly prescribed medications were explained.  A complete medication list was provided to the patient / caregiver. 5)  Please schedule a follow-up appointment as needed.  6)  Copy sent to : Dr. Leanna Battles... 7)  Please continue current medications.   Appended Document: 3 WEEK F/U    Clinical Lists Changes  Medications: Added new medication of NASCOBAL 500 MCG/0.1ML SOLN (CYANOCOBALAMIN) 1 spray once a week - Signed Added new medication of LOMOTIL 2.5-0.025 MG TABS (DIPHENOXYLATE-ATROPINE) 1 q 6 hrs as needed diarrhea - Signed Rx of NASCOBAL 500 MCG/0.1ML SOLN (CYANOCOBALAMIN) 1 spray once a week;  #1 x 6;  Signed;  Entered by: Alberteen Spindle RN;  Authorized by: Sable Feil MD [facility];  Method used: Print then Give to Patient Rx of LOMOTIL 2.5-0.025 MG TABS (DIPHENOXYLATE-ATROPINE) 1 q 6 hrs as needed diarrhea;  #40 x 3;  Signed;  Entered by: Alberteen Spindle RN;  Authorized by: Sable Feil MD [facility];  Method used: Print then Give to Patient    Prescriptions: LOMOTIL 2.5-0.025 MG TABS (DIPHENOXYLATE-ATROPINE) 1 q 6 hrs as needed diarrhea  #40 x 3   Entered by:   Alberteen Spindle RN   Authorized by:   Sable Feil MD [facility]   Signed by:   Alberteen Spindle RN on  04/10/2010   Method used:   Print then Give to Patient   RxID:   [id number] NASCOBAL 500 MCG/0.1ML SOLN (CYANOCOBALAMIN) 1 spray once a week  #1 x 6   Entered by:   Alberteen Spindle RN   Authorized by:   Sable Feil MD [facility]   Signed by:   Alberteen Spindle RN on 04/10/2010   Method used:   Print then Give to Patient   RxID:   [id number]    Appended Document: 3 WEEK F/U    Clinical Lists Changes  Orders: Added new Service order of Vit B12 1000 mcg [id number]) - Signed       Medication Administration  Injection # 1:    Medication: Vit B12 1000 mcg     Diagnosis: B12 DEFICIENCY (ICD-266.2)    Route: IM    Site: R deltoid    Exp Date: 02/13    Lot #: [id number]    Mfr: American Regent    Patient tolerated injection without complications    Given by: Alberteen Spindle RN (April 10, 2010 3:47 PM)  Orders Added: 1)  Vit B12 1000 mcg [id number]

## 2010-11-13 NOTE — Assessment & Plan Note (Signed)
Summary: #1 of 3 weekly B12 inj, 266.2, dfs  Nurse Visit   Allergies: No Known Drug Allergies  Medication Administration  Injection # 1:    Medication: Vit B12 1000 mcg    Diagnosis: B12 DEFICIENCY (ICD-266.2)    Route: IM    Site: R deltoid    Exp Date: 4/13    Lot #: [id number]    Mfr: APP Phar    Patient tolerated injection without complications    Given by: Alberteen Spindle RN (March 19, 2010 12:10 PM)  Orders Added: 1)  Vit B12 1000 mcg [id number]

## 2010-11-13 NOTE — Assessment & Plan Note (Signed)
Summary: OV & MONTHLY B12...LSW.   History of Present Illness Visit Type: Follow-up Visit Primary GI MD: Verl Blalock MD [place] Primary Provider: Leanna Battles, MD Requesting Provider: na Chief Complaint: Pt states that she is having severe GERD and bloating History of Present Illness:   Stool exam for Vicente Masson was extremely low suggesting chronic pancreatic insufficiency. However, treatment with Zenpep pancreatic extract has not helped her chronic diarrhea, gas, bloating, and diffuse abdominal pain. She now complains of worsening acid reflux despite twice a day Nexium. She is all right medications listed and reviewed in her record including Plavix because of a peripheral vascular shunt placed by Dr. Kellie Simmering. She has multiple medical problems including hypertension, fatty liver, chronic depression, osteoarthritis, and COPD. She denies ongoing use of alcohol or cigarettes.   GI Review of Systems    Reports bloating.      Denies abdominal pain, acid reflux, belching, chest pain, dysphagia with liquids, dysphagia with solids, heartburn, loss of appetite, nausea, vomiting, vomiting blood, weight loss, and  weight gain.        Denies anal fissure, black tarry stools, change in bowel habit, constipation, diarrhea, diverticulosis, fecal incontinence, heme positive stool, hemorrhoids, irritable bowel syndrome, jaundice, light color stool, liver problems, rectal bleeding, and  rectal pain.    Current Medications (verified): 1)  Norpramin 10 Mg  Tabs (Desipramine Hcl) .... Take One Teb At Bedtime 2)  Pamine Forte 5 Mg Tabs (Methscopolamine Bromide) .... Take 1 Tablet By Mouth Twice A Day 3)  Venlafaxine Hcl 150 Mg Xr24h-Cap (Venlafaxine Hcl) .... Once Daily 4)  Nexium 40 Mg Cpdr (Esomeprazole Magnesium) .... Once Daily 5)  Singulair 10 Mg Tabs (Montelukast Sodium) .... Once Daily 6)  Lisinopril 40 Mg Tabs (Lisinopril) .... Once Daily 7)  Furosemide 20 Mg Tabs (Furosemide) .... Once  Daily 8)  Hydrocodone-Acetaminophen 5-500 Mg Tabs (Hydrocodone-Acetaminophen) .... As Needed 9)  Metoclopramide Hcl 5 Mg Tabs (Metoclopramide Hcl) .... Once Daily 10)  Amlodipine Besylate 10 Mg Tabs (Amlodipine Besylate) .... Once Daily 11)  Lipitor 20 Mg Tabs (Atorvastatin Calcium) .... Once Daily 12)  Zolpidem Tartrate 10 Mg Tabs (Zolpidem Tartrate) .... Once Daily 13)  Atenolol 50 Mg Tabs (Atenolol) .... Once Daily 14)  Topiramate 25 Mg Cpsp (Topiramate) .... Once Daily 15)  Gabapentin 300 Mg Caps (Gabapentin) .... Once Daily 16)  Plavix 75 Mg Tabs (Clopidogrel Bisulfate) .... One Tablet By Mouth Once Daily 17)  Aspirin 81 Mg  Tabs (Aspirin) .... One Tablet By Mouth Once Daily 18)  Cyanocobalamin 1000 Mcg/ml Inj Soln (Cyanocobalamin) .Marland Kitchen.. 1 Cc Im  Monthly. 19)  Zenpep 20000 Unit Cpep (Pancrelipase (Lip-Prot-Amyl)) .... 2 Tabs Three Times A Day  Allergies (verified): No Known Drug Allergies  Past History:  Past medical, surgical, family and social histories (including risk factors) reviewed for relevance to current acute and chronic problems.  Past Medical History: Reviewed history from 12/17/2007 and no changes required. Current Problems:  ARTHRITIS (ICD-716.90) IBS (ICD-564.1) MELANOSIS COLI (ICD-569.89) HYPERTENSION (ICD-401.9) LAXATIVE ABUSE (ICD-305.90) DEPRESSION (ICD-311) HEPATOMEGALY (ICD-789.1) HIATAL HERNIA (ICD-553.3) FATTY LIVER DISEASE (ICD-571.8) GERD (ICD-530.81)  Past Surgical History: Reviewed history from 03/16/2010 and no changes required. left knee arthroscopy Hysterectomy Wrist surgery elbow Stent in leg   Family History: Reviewed history from 04/10/2010 and no changes required. Family History of Colon Cancer:  PGF  Social History: Reviewed history from 03/16/2010 and no changes required. Occupation: Retired Divorced  Patient currently smokes.  Alcohol Use - no Daily Caffeine Use: Diet Cokes daily  Illicit Drug Use - no  Review of  Systems       The patient complains of cough, fatigue, and sore throat.  The patient denies allergy/sinus, anemia, anxiety-new, arthritis/joint pain, back pain, blood in urine, breast changes/lumps, change in vision, confusion, coughing up blood, depression-new, fainting, fever, headaches-new, hearing problems, heart murmur, heart rhythm changes, itching, menstrual pain, muscle pains/cramps, night sweats, nosebleeds, pregnancy symptoms, shortness of breath, skin rash, sleeping problems, swelling of feet/legs, swollen lymph glands, thirst - excessive , urination - excessive , urination changes/pain, urine leakage, vision changes, and voice change.    Vital Signs:  Patient profile:   65 year old female Height:      63 inches Weight:      200 pounds BMI:     35.56 BSA:     1.94 Pulse rate:   62 / minute Pulse rhythm:   regular BP sitting:   132 / 86  (left arm) Cuff size:   regular  Vitals Entered By: Hope Pigeon CMA (June 21, 2010 10:56 AM)  Physical Exam  General:  Well developed, well nourished, no acute distress.healthy appearing and obese.   Head:  Normocephalic and atraumatic. Eyes:  PERRLA, no icterus.exam deferred to patient's ophthalmologist.   Psych:  Alert and cooperative. Normal mood and affect.   Impression & Recommendations:  Problem # 1:  B12 DEFICIENCY (ICD-266.2) Assessment Improved Continue her replacement therapy.  Problem # 2:  DIARRHEA (ICD-787.91) Assessment: Improved Continue pancreatic extract therapy. 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 Her bacterial overgrowth may be associated with her chronic pancreatitis. She has had recent CT scan that was reviewed which has not shown any evidence of anatomical pancreatic abnormalities. However, pancreatic ultrasonography will be considered.  Problem # 4:   IBS (ICD-564.1) Assessment: Unchanged  Orders: Colon/Endo (Colon/Endo)  Problem # 5:  FATTY LIVER DISEASE (ICD-571.8) Assessment: Unchanged  Problem # 6:  GERD (ICD-530.81) Assessment: Deteriorated  Continue twice a day PPI therapy and have added Carafate suspension 10 cc at bedtime and 5 cc after meals. At the time of endoscopy we'll ascertain if she has a large hiatal hernia, obtain small bowel biopsy, and repeat exam for H. pylori.  Orders: Colon/Endo (Colon/Endo)  Patient Instructions: 1)  Copy sent to : Dr. Leanna Battles 2)  Please continue current medications.  3)  Colonoscopy and Flexible Sigmoidoscopy brochure given.  4)  Conscious Sedation brochure given.  5)  Upper Endoscopy brochure given.  6)  Carafate suspension 4 times a day 7)  Your prescription(s) have been sent to you pharmacy.  8)  Your Colonoscopy and Endoscopy have been scheduled for 08/01/2010, see seperate instructions. 9)  You were given your monthly b12 injection today.  10)  The medication list was reviewed and reconciled.  All changed / newly prescribed medications were explained.  A complete medication list was provided to the patient / caregiver. Prescriptions: CARAFATE 1 GM/10ML SUSP (SUCRALFATE) take 10cc before bedtime and 5cc before each meal.  #800 ml x 1   Entered by:   Bernita Buffy CMA ([place])   Authorized by:   Sable Feil MD [facility]   Signed by:   Bernita Buffy CMA ([place]) on 06/21/2010   Method used:   Electronically to        [facility] [phone]* (retail)  [address] [place] [address]       [place], [place]  [address]       Ph: [phone]       Fax: [phone]   RxID:   [id number] MOVIPREP 100 GM  SOLR (PEG-KCL-NACL-NASULF-NA ASC-C) As per prep instructions.  #1 x 0   Entered by:   Bernita Buffy CMA ([place])   Authorized by:   Sable Feil MD [facility]   Signed by:   Bernita Buffy CMA ([place]) on 06/21/2010   Method used:   Electronically to        [facility] [phone]* (retail)       [address]       [place], [place]  [address]       Ph: [phone]       Fax: [phone]   RxID:   [id number]    Medication Administration  Injection # 1:    Medication: Vit B12 1000 mcg    Diagnosis: B12 DEFICIENCY (ICD-266.2)    Route: IM    Site: L deltoid    Exp Date: 04/13/2012    Lot #: [id number]    Patient tolerated injection without complications    Given by: Bernita Buffy CMA ([place]) (June 21, 2010 11:27 AM)  Orders Added: 1)  Colon/Endo [Colon/Endo]

## 2010-11-13 NOTE — Assessment & Plan Note (Addendum)
Summary: b12 shot  Nurse Visit   Allergies: No Known Drug Allergies  Medication Administration  Injection # 1:    Medication: Vit B12 1000 mcg    Diagnosis: B12 DEFICIENCY (ICD-266.2)    Route: IM    Site: R deltoid    Exp Date: 10/13    Lot #: [id number]    Mfr: [place]    Patient tolerated injection without complications    Given by: Madlyn Frankel CMA ([place]) (September 21, 2010 11:33 AM)  Orders Added: 1)  Vit B12 1000 mcg [id number]

## 2010-11-13 NOTE — Assessment & Plan Note (Signed)
Summary: mthly b12 injection  Nurse Visit   Allergies: No Known Drug Allergies  Medication Administration  Injection # 1:    Medication: Vit B12 1000 mcg    Diagnosis: B12 DEFICIENCY (ICD-266.2)    Route: IM    Site: R deltoid    Exp Date: 8/13    Lot #: [id number]    Mfr: [place]    Patient tolerated injection without complications    Given by: Carla Drape Nelson-Smith CMA ([place]) (August 21, 2010 11:49 AM)  Orders Added: 1)  Vit B12 1000 mcg [[id number]]   Medication Administration  Injection # 1:    Medication: Vit B12 1000 mcg    Diagnosis: B12 DEFICIENCY (ICD-266.2)    Route: IM    Site: R deltoid    Exp Date: 8/13    Lot #: [id number]    Mfr: [place]    Patient tolerated injection without complications    Given by: Carla Drape Nelson-Smith CMA ([place]) (August 21, 2010 11:49 AM)  Orders Added: 1)  Vit B12 1000 mcg [id number]

## 2010-11-13 NOTE — Letter (Signed)
Summary: [facility] Instructions  [facility]  [place], [place] [address]   Phone: [phone]  Fax: [phone]       SHALINI STINEBAUGH    01-21-1946    MRN: [id number]        Procedure Day /Date: 08/01/2010 Wednesday     Arrival Time: 1:30pm     Procedure Time: 2:30pm       Location of Procedure:                    X  [place] (4th Floor)   [place]   Starting 5 days prior to your procedure 07/27/2010 do not eat nuts, seeds, popcorn, corn, beans, peas,  salads, or any raw vegetables.  Do not take any fiber supplements (e.g. Metamucil, Citrucel, and Benefiber).  THE DAY BEFORE YOUR PROCEDURE         DATE: 07/31/2010 DAY: Tuesday  1.  Drink clear liquids the entire day-NO SOLID FOOD  2.  Do not drink anything colored red or purple.  Avoid juices with pulp.  No orange juice.  3.  Drink at least 64 oz. (8 glasses) of fluid/clear liquids during the day to prevent dehydration and help the prep work efficiently.  CLEAR LIQUIDS INCLUDE: Water Jello Ice Popsicles Tea (sugar ok, no milk/cream) Powdered fruit flavored drinks Coffee (sugar ok, no milk/cream) Gatorade Juice: apple, white grape, white cranberry  Lemonade Clear bullion, consomm, broth Carbonated beverages (any kind) Strained chicken noodle soup Hard Candy                             4.  In the morning, mix first dose of MoviPrep solution:    Empty 1 Pouch A and 1 Pouch B into the disposable container    Add lukewarm drinking water to the top line of the container. Mix to dissolve    Refrigerate (mixed solution should be used within 24 hrs)  5.  Begin drinking the prep at 5:00 p.m. The MoviPrep container is divided by 4 marks.   Every 15 minutes drink the solution down to the next mark (approximately 8 oz) until the full liter is complete.   6.  Follow completed prep with 16 oz of clear liquid of your choice (Nothing red or purple).   Continue to drink clear liquids until bedtime.  7.  Before going to bed, mix second dose of MoviPrep solution:    Empty 1 Pouch A and 1 Pouch B into the disposable container    Add lukewarm drinking water to the top line of the container. Mix to dissolve    Refrigerate  THE DAY OF YOUR PROCEDURE      DATE: 08/01/2010 DAY: Wednesday  Beginning at 9:30am (5 hours before procedure):         1. Every 15 minutes, drink the solution down to the next mark (approx 8 oz) until the full liter is complete.  2. Follow completed prep with 16 oz. of clear liquid of your choice.    3. You may drink clear liquids until 12:30pm (2 HOURS BEFORE PROCEDURE).   MEDICATION INSTRUCTIONS  Unless otherwise instructed, you should take regular prescription medications with a small sip of water   as early as possible the morning of your procedure.   Stay on Plavix for the procedure Per Dr. Sharlett Iles          OTHER INSTRUCTIONS  You will need a responsible adult at least 64 years of age to accompany you and drive you home.   This person must remain in the waiting room during your procedure.  Wear loose fitting clothing that is easily removed.  Leave jewelry and other valuables at home.  However, you may wish to bring a book to read or  an iPod/MP3 player to listen to music as you wait for your procedure to start.  Remove all body piercing jewelry and leave at home.  Total time from sign-in until discharge is approximately 2-3 hours.  You should go home directly after your procedure and rest.  You can resume normal activities the  day after your procedure.  The day of your procedure you should not:   Drive   Make legal decisions   Operate machinery   Drink alcohol   Return to work  You will receive specific instructions about eating, activities and medications before you leave.    The above instructions have been reviewed and explained to me by   _______________________    I  fully understand and can verbalize these instructions _____________________________ Date _________

## 2010-11-13 NOTE — Procedures (Signed)
Summary: Upper Endoscopy  Patient: [name] Note: All result statuses are Final unless otherwise noted.  Tests: (1) Upper Endoscopy (EGD)   EGD Upper Endoscopy       [facility].     [place], [place]  [address]           ENDOSCOPY PROCEDURE REPORT           PATIENT:  Joanna, Strong  MR#:  [id number]     BIRTHDATE:  [birth year], 64 yrs. old  GENDER:  female           ENDOSCOPIST:  Loralee Pacas. Sharlett Iles, MD, [facility]     Referred by:           PROCEDURE DATE:  08/01/2010     PROCEDURE:  EGD, diagnostic     ASA CLASS:  Class III     INDICATIONS:  GERD, diarrhea, abdominal pain, multiple sites           MEDICATIONS:   There was residual sedation effect present from     prior procedure., Fentanyl 25 mcg IV, Versed 2 mg IV     TOPICAL ANESTHETIC:           DESCRIPTION OF PROCEDURE:   After the risks benefits and     alternatives of the procedure were thoroughly explained, informed     consent was obtained.  The LB GIF-H180 [id number] endoscope was     introduced through the mouth and advanced to the second portion of     the duodenum, without limitations.  The instrument was slowly     withdrawn as the mucosa was fully examined.     <<PROCEDUREIMAGES>>           Normal duodenal folds were noted.  Mild gastritis was found in the     fundus. SCATTERED HYPERPLASTIC NODULES.NO VARICES.  A hiatal     hernia was found. 3 CM. HH NOTED.NO STRICTURE OR VARICES.NO     BLEEDING OR ULCERS NOTED.  The esophagus and gastroesophageal     junction were completely normal in appearance. IRREGULAR Z-LINE     NOTED.    Retroflexed views revealed a hiatal hernia.    The scope     was then withdrawn from the patient and the procedure completed.           COMPLICATIONS:  None           ENDOSCOPIC IMPRESSION:     1) Normal duodenal folds     2) Mild gastritis in the fundus     3) Hiatal hernia     4) Normal esophagus     KNOWN CHRONIC PANCREATITIS AND CHRONIC  MALABSORPTION AND B12     DEFICIENCY.NOW ON PO PANCREATIC EXTRACT RX,,     RECOMMENDATIONS:     1) continue current medications           REPEAT EXAM:  No           ______________________________     Loralee Pacas. Sharlett Iles, MD, [facility]           CC:  Leanna Battles, MD           n.     Lorrin Mais:   Loralee Pacas. Patterson at 08/01/2010 03:36 PM           [name], [id number]  Note: An exclamation mark (!) indicates a result that was not dispersed into the flowsheet.  Document Creation Date: 08/01/2010 3:36 PM _______________________________________________________________________  (1) Order result status: Final Collection or observation date-time: 08/01/2010 15:28 Requested date-time:  Receipt date-time:  Reported date-time:  Referring Physician:   Ordering Physician: Verl Blalock [phone]) Specimen Source:  Source: Tawanna Cooler Order Number: [id number] Lab site:

## 2010-11-13 NOTE — Assessment & Plan Note (Signed)
Summary: FOLLOW UP/YF   History of Present Illness Visit Type: Follow-up Visit Primary GI MD: Verl Blalock MD [facility] Primary Provider: Leanna Battles, MD Chief Complaint: Gas and bloating, Bowel habits have stablized History of Present Illness:   [name] has been at [facility] for extensive evaluation and was felt to have chronic laxative abuse. She was empirically treated with probiotics and seems to be doing fairly well from a GI standpoint but is on multiple, multiple medications including Pamine 5 mg twice a day, Nexium 40 mg a day, daily hydrocodone, and multiple neurological medications. She continued with gas and bloating but denies chronic diarrhea or abdominal pain at this time. She denies a specific food intolerances. I will not review her extensive GI evaluation which has been very extensive, complex, nonrevealing, and resulted in tertiary medical center referral which is available for review from [facility].   GI Review of Systems    Reports bloating.      Denies abdominal pain, acid reflux, belching, chest pain, dysphagia with liquids, dysphagia with solids, heartburn, loss of appetite, nausea, vomiting, vomiting blood, weight loss, and  weight gain.        Denies anal fissure, black tarry stools, change in bowel habit, constipation, diarrhea, diverticulosis, fecal incontinence, heme positive stool, hemorrhoids, irritable bowel syndrome, jaundice, light color stool, liver problems, rectal bleeding, and  rectal pain. Visit Type:  Follow-up Visit Primary Care Provider:  Leanna Battles, MD  Chief Complaint:  Gas and bloating and Bowel habits have stablized.  History of Present Illness: G    Current Medications (verified): 1)  Norpramin 10 Mg  Tabs (Desipramine Hcl) .... Take One Teb At Bedtime 2)  Pamine Forte 5 Mg Tabs (Methscopolamine Bromide) .... Take 1 Tablet By Mouth Twice A Day 3)  Venlafaxine Hcl 150 Mg Xr24h-Cap (Venlafaxine Hcl) .... Once Daily 4)  Nexium  40 Mg Cpdr (Esomeprazole Magnesium) .... Once Daily 5)  Singulair 10 Mg Tabs (Montelukast Sodium) .... Once Daily 6)  Lisinopril 40 Mg Tabs (Lisinopril) .... Once Daily 7)  Furosemide 20 Mg Tabs (Furosemide) .... Once Daily 8)  Hydrocodone-Acetaminophen 5-500 Mg Tabs (Hydrocodone-Acetaminophen) .... As Needed 9)  Metoclopramide Hcl 5 Mg Tabs (Metoclopramide Hcl) .... Once Daily 10)  Amlodipine Besylate 10 Mg Tabs (Amlodipine Besylate) .... Once Daily 11)  Lipitor 20 Mg Tabs (Atorvastatin Calcium) .... Once Daily 12)  Zolpidem Tartrate 10 Mg Tabs (Zolpidem Tartrate) .... Once Daily 13)  Atenolol 50 Mg Tabs (Atenolol) .... Once Daily 14)  Topiramate 25 Mg Cpsp (Topiramate) .... Once Daily 15)  Gabapentin 300 Mg Caps (Gabapentin) .... Once Daily  Allergies (verified): No Known Drug Allergies  Past History:  Past medical, surgical, family and social histories (including risk factors) reviewed for relevance to current acute and chronic problems.  Past Medical History: Reviewed history from 12/17/2007 and no changes required. Current Problems:  ARTHRITIS (ICD-716.90) IBS (ICD-564.1) MELANOSIS COLI (ICD-569.89) HYPERTENSION (ICD-401.9) LAXATIVE ABUSE (ICD-305.90) DEPRESSION (ICD-311) HEPATOMEGALY (ICD-789.1) HIATAL HERNIA (ICD-553.3) FATTY LIVER DISEASE (ICD-571.8) GERD (ICD-530.81)  Past Surgical History: left knee arthroscopy Hysterectomy Wrist surgery elbow  Family History: Reviewed history and no changes required.  Social History: Reviewed history and no changes required.  Vital Signs:  Patient profile:   65 year old female Height:      63 inches Weight:      195.25 pounds BMI:     34.71 Pulse rate:   64 / minute Pulse rhythm:   regular BP sitting:   100 / 60  (  left arm) Cuff size:   regular  Vitals Entered By: June McMurray [place] Deborra Medina) (October 17, 2009 3:41 PM)  Physical Exam  General:  Well developed, well nourished, no acute distress.obese.   Head:   Normocephalic and atraumatic. Eyes:  PERRLA, no icterus.exam deferred to patient's ophthalmologist.   Lungs:  Clear throughout to auscultation. Heart:  Regular rate and rhythm; no murmurs, rubs,  or bruits. Abdomen:  Soft, nontender and nondistended. No masses, hepatosplenomegaly or hernias noted. Normal bowel sounds.obese.   Extremities:  No clubbing, cyanosis, edema or deformities noted. Neurologic:  Alert and  oriented x4;  grossly normal neurologically. Psych:  Alert and cooperative. Normal mood and affect.   Impression & Recommendations:  Problem # 1:  IBS (ICD-564.1) Assessment Improved Low gas diet and will prescribe Hardin Negus: Health probiotics twice a day. I see no need for repeat GI evaluation at this time and have tried to reiterate this to this complex patient who has multiple functional GI issues.  Problem # 2:  MELANOSIS COLI (ICD-569.89) Assessment: Unchanged Consistent with chronic occult laxative use.  Problem # 3:  LAXATIVE ABUSE (ICD-305.90) Assessment: Comment Only  Patient Instructions: 1)  Copy sent to : Dr. Leanna Battles 2)  Excessive Gas Diet handout given.  3)  Please schedule a follow-up appointment as needed.  4)  [facility] daily.  Buy OTC. 5)  The medication list was reviewed and reconciled.  All changed / newly prescribed medications were explained.  A complete medication list was provided to the patient / caregiver.

## 2010-11-13 NOTE — Procedures (Signed)
Summary: Colonoscopy  Patient: [name] Note: All result statuses are Final unless otherwise noted.  Tests: (1) Colonoscopy (COL)   COL Colonoscopy           DONE (C)     [facility].     [place], [place]  [address]           COLONOSCOPY PROCEDURE REPORT           PATIENT:  [name], Work  MR#:  [id number]     BIRTHDATE:  1946/02/10, 64 yrs. old  GENDER:  female     ENDOSCOPIST:  Loralee Pacas. Sharlett Iles, MD, [facility]     REF. BY:     PROCEDURE DATE:  08/01/2010     PROCEDURE:  Surveillance Colonoscopy, Colonoscopy with biopsy     ASA CLASS:  Class III     INDICATIONS:  Abdominal pain, Colorectal cancer screening, average     risk, unexplained diarrhea     MEDICATIONS:   Fentanyl 75 mcg IV, Versed 6 mg IV           DESCRIPTION OF PROCEDURE:   After the risks benefits and     alternatives of the procedure were thoroughly explained, informed     consent was obtained.  Digital rectal exam was performed and     revealed no abnormalities.   The LB CF-[id number] [id number] endoscope     was introduced through the anus and advanced to the cecum, which     was identified by both the appendix and ileocecal valve, without     limitations.  The quality of the prep was poor, using MoviPrep.     The instrument was then slowly withdrawn as the colon was fully     examined.     <<PROCEDUREIMAGES>>           FINDINGS:  Severe diverticulosis was found in the sigmoid to     descending colon segments.  This was otherwise a normal     examination of the colon. VERY,VERY POOR PREP !!!!   Retroflexed     views in the rectum revealed no abnormalities.    The scope was     then withdrawn from the patient and the procedure completed.           COMPLICATIONS:  None     ENDOSCOPIC IMPRESSION:     1) Severe diverticulosis in the sigmoid to descending colon     segments     2) Otherwise normal examination     LEFT COLON BIOPSIES DONE.R/OM MICROSCOPIC COLITIS.     RECOMMENDATIONS:  1) Continue current medications     2) Repeat Colonscopy in 10 years.     REPEAT EXAM:  No           ______________________________     Loralee Pacas. Sharlett Iles, MD, [facility]           CC:  Leanna Battles, MD           n.     REVISED:  08/01/2010 03:38 PM     eSIGNED:   Loralee Pacas. Patterson at 08/01/2010 03:38 PM           [name], [id number]  Note: An exclamation mark (!) indicates a result that was not dispersed into the flowsheet. Document Creation Date: 08/01/2010 3:38 PM _______________________________________________________________________  (1) Order result status: Final Collection or observation date-time: 08/01/2010 15:12 Requested date-time:  Receipt date-time:  Reported date-time:  Referring  Physician:   Ordering Physician: Verl Blalock [phone]) Specimen Source:  Source: Tawanna Cooler Order Number: [id number] Lab site:   Appended Document: Colonoscopy     Procedures Next Due Date:    Colonoscopy: 07/2015

## 2010-11-13 NOTE — Assessment & Plan Note (Signed)
Summary: Monthly B12/266.2/dfs  Nurse Visit   Allergies: No Known Drug Allergies  Medication Administration  Injection # 1:    Medication: Vit B12 1000 mcg    Diagnosis: B12 DEFICIENCY (ICD-266.2)    Route: IM    Site: L deltoid    Exp Date: 03/2012    Lot #: [id number]    Mfr: [employer]    Comments: pt to schedule next monthly b 12 at the front desk.  Pt also questioned making a f/u appt with Dr Sharlett Iles.  She is going to do that at the front desk as well    Patient tolerated injection without complications    Given by: Christian Mate CMA Deborra Medina) (May 21, 2010 2:05 PM)  Orders Added: 1)  Vit B12 1000 mcg [id number]

## 2010-11-13 NOTE — Progress Notes (Signed)
Summary: Question about meds.  Phone Note Call from Patient Call back at [facility] Phone [phone]   Caller: Patient Call For: Dr. Sharlett Iles Reason for Call: Talk to Nurse Summary of Call: pt was vomiting and nauseated last night. Wants to know if her med. Creon could cause that Initial call taken by: Webb Laws,  May 01, 2010 2:20 PM  Follow-up for Phone Call        Pt states she has been taking creon for over one month.  Last pm had n/v.  None today but does not feel well.  Creon was changed to zenpep by Dr. Sharlett Iles but pt has not started the new med yet. Pt insturcted to stop creon.  Wait a few days then start zenpep.  Pt instructed to report back if problems develop. Follow-up by: Alberteen Spindle RN,  May 01, 2010 2:52 PM

## 2010-11-13 NOTE — Assessment & Plan Note (Signed)
Summary: B12 SHOT  Nurse Visit   Allergies: No Known Drug Allergies  Medication Administration  Injection # 1:    Medication: Vit B12 1000 mcg    Diagnosis: B12 DEFICIENCY (ICD-266.2)    Route: IM    Site: R deltoid    Exp Date: 04/2012    Lot #: [id number]    Mfr: [employer]    Patient tolerated injection without complications    Given by: Randye Lobo NCMA (July 23, 2010 11:13 AM)  Orders Added: 1)  Vit B12 1000 mcg [id number]

## 2010-11-13 NOTE — Progress Notes (Signed)
Summary: Triage  Phone Note Call from Patient Call back at Home Phone [phone]   Caller: Patient Call For: Dr. Sharlett Iles Reason for Call: Talk to Nurse Summary of Call: pt r/s her appt. for today until 04-10-10 b/c she overslept. Wants to know if she can sch'd a B-12 injection Initial call taken by: Webb Laws,  April 02, 2010 11:01 AM  Follow-up for Phone Call        talked with pt.  She will wait and get B12 when she comes for OV next week. Follow-up by: Alberteen Spindle RN,  April 02, 2010 12:30 PM

## 2010-11-13 NOTE — Assessment & Plan Note (Signed)
Summary: bloating, diarrhea...em   History of Present Illness Visit Type: Follow-up Visit Primary GI MD: Verl Blalock MD [place] Primary Provider: Leanna Battles, MD Requesting Provider: na Chief Complaint: Bloating and diarrhea  History of Present Illness:   Complicated and complex case of a 65 year old Caucasian female with hypertensive cardiovascular disease and peripheral vascular disease on chronic Plavix and aspirin therapy .There is a history of chronic GI complaints and issues for many years with negative exams. 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 She did not return for followup at [facility]. She also has obesity, hyperlipidemia, and chronic right upper quadrant pain related to fatty infiltration of her liver. She also has chronic arthritic pain syndrome and is on Gabapentum 20 mg a day, topiramate 25 mg a day,hydrocodone 5 mg several times a day, Amy 5 mg twice a day, Norpramin 10 mg at bedtime, and p.r.n. Reglan.  She's had multiple CT scans, gallbladder exams, and gastric emptying scan which had been normal and several negative colonoscopies and endoscopies including small bowel biopsies. She also suffers from chronic anxiety and depression. Therapeutic trial the past of Questran have not been unsuccessful. She has not been on latornex therapy.She denies use of sorbitol or fructose or any history of lactose intolerance. Previous trials of probiotics also have been unsuccessful.  Despite all these complaints she's had no anorexia or weight loss or other systemic complaints. She is on Nexium 40 mg a day for chronic GERD. She does smoke but denies a history of alcohol abuse.   GI Review of Systems    Reports bloating.      Denies abdominal pain, acid reflux, belching, chest pain, dysphagia with liquids, dysphagia with solids, heartburn, loss of appetite,  nausea, vomiting, vomiting blood, weight loss, and  weight gain.      Reports diarrhea.      Current Medications (verified): 1)  Norpramin 10 Mg  Tabs (Desipramine Hcl) .... Take One Teb At Bedtime 2)  Pamine Forte 5 Mg Tabs (Methscopolamine Bromide) .... Take 1 Tablet By Mouth Twice A Day 3)  Venlafaxine Hcl 150 Mg Xr24h-Cap (Venlafaxine Hcl) .... Once Daily 4)  Nexium 40 Mg Cpdr (Esomeprazole Magnesium) .... Once Daily 5)  Singulair 10 Mg Tabs (Montelukast Sodium) .... Once Daily 6)  Lisinopril 40 Mg Tabs (Lisinopril) .... Once Daily 7)  Furosemide 20 Mg Tabs (Furosemide) .... Once Daily 8)  Hydrocodone-Acetaminophen 5-500 Mg Tabs (Hydrocodone-Acetaminophen) .... As Needed 9)  Metoclopramide Hcl 5 Mg Tabs (Metoclopramide Hcl) .... Once Daily 10)  Amlodipine Besylate 10 Mg Tabs (Amlodipine Besylate) .... Once Daily 11)  Lipitor 20 Mg Tabs (Atorvastatin Calcium) .... Once Daily 12)  Zolpidem Tartrate 10 Mg Tabs (Zolpidem Tartrate) .... Once Daily 13)  Atenolol 50 Mg Tabs (Atenolol) .... Once Daily 14)  Topiramate 25 Mg Cpsp (Topiramate) .... Once Daily 15)  Gabapentin 300 Mg Caps (Gabapentin) .... Once Daily 16)  Probiotic Pearls  Caps (Probiotic Product) .... One Capsule By Mouth Once Daily 17)  Plavix 75 Mg Tabs (Clopidogrel Bisulfate) .... One Tablet By Mouth Once Daily 18)  Aspirin 81 Mg  Tabs (Aspirin) .... One Tablet By Mouth Once Daily  Allergies (verified): No Known Drug Allergies  Past History:  Past medical, surgical, family and social histories (including risk factors) reviewed for relevance to current acute and chronic problems.  Past Medical History: Reviewed history from 12/17/2007 and no changes  required. 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 Family History of Colon Cancer: ? PGM   Social History: Reviewed history and no changes required. Occupation: Retired Divorced  Patient currently smokes.  Alcohol Use - no Daily Caffeine Use: Diet Cokes daily  Illicit Drug Use - no Smoking Status:  current Drug Use:  no  Review of Systems       The patient complains of arthritis/joint pain, back pain, and fatigue.  The patient denies allergy/sinus, anemia, anxiety-new, blood in urine, breast changes/lumps, change in vision, confusion, cough, coughing up blood, depression-new, fainting, fever, headaches-new, hearing problems, heart murmur, heart rhythm changes, itching, menstrual pain, muscle pains/cramps, night sweats, nosebleeds, pregnancy symptoms, shortness of breath, skin rash, sleeping problems, sore throat, swelling of feet/legs, swollen lymph glands, thirst - excessive , urination - excessive , urination changes/pain, urine leakage, vision changes, and voice change.    Vital Signs:  Patient profile:   65 year old female Height:      63 inches Weight:      195 pounds BMI:     34.67 BSA:     1.91 Pulse rate:   60 / minute Pulse rhythm:   regular BP sitting:   120 / 64  (left arm) Cuff size:   regular  Vitals Entered By: Hope Pigeon CMA (March 16, 2010 11:08 AM)  Physical Exam  General:  Well developed, well nourished, no acute distress.healthy appearing.   Head:  Normocephalic and atraumatic. Eyes:  PERRLA, no icterus. Lungs:  Clear throughout to auscultation. Heart:  Regular rate and rhythm; no murmurs, rubs,  or bruits. Abdomen:  Soft, nontender and nondistended. No masses, hepatosplenomegaly or hernias noted. Normal bowel sounds.Obesity of the abdominal wall noted without definite organomegaly. Bowel sounds are normal Rectal:  Normal exam.Stool is  of normal color and guaiac-negative. Msk:  Symmetrical with no gross deformities. Normal posture. Extremities:  trace pedal edema.   Neurologic:  Alert and  oriented x4;  grossly normal neurologically. Psych:  Alert and cooperative. Normal mood and affect.   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 I've ordered repeat malabsorption workup, and we'll check stool for laxative screen, fecal Elastase-1 for pancreatic insufficiency, O&P exam, and fat examination. Previous B12 levels were borderline and will be repeated. In the interim I will give her a therapeutic trial of metronidazole 250 mg t.i.d. with Align probiotic therapy. She cannot afford Xifaxan therapy which would be a better specific treatment for bacterial overgrowth syndrome. I will see the patient back in several weeks' time for followup. On reviewing her chart and talking with her, for some reason she has not been on loperamide in the past which is certainly a therapeutic option. It is of note the patient is status post cholecystectomy. Some of her diarrheal syndrome may be related also to her multiple medications including Reglan which has been discontinued, Lipitor, Nexium, et Ronney Asters. The patient currently is dependent on hydrocodone, Ambien, and Norpramin.  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 Orders: TLB-B12, Serum-Total ONLY [id number]) TLB-Ferritin ([id number]) TLB-Folic Acid (Folate) (XX123456) TLB-IBC Pnl (Iron/FE;Transferrin) (83550-IBC) TLB-Amylase (82150-AMYL) TLB-Lipase (83690-LIPASE) TLB-Magnesium (Mg) (  83735-MG) TLB-Sedimentation Rate (ESR) (85652-ESR) TLB-IgA (Immunoglobulin A) (82784-IGA) T-Beta Carotene [phone]) T-Sprue Panel (Celiac Disease Aby Eval) (83516x3/[address]) T-Stool Fats [place] Stain [phone]) T-Stool for O&P LQ:1409369) T- *  Misc. Laboratory test [phone])  Problem # 3:  IBS (ICD-564.1) Assessment: Unchanged  Orders: TLB-B12, Serum-Total ONLY [id number]) TLB-Ferritin ([id number]) TLB-Folic Acid (Folate) (XX123456) TLB-IBC Pnl (Iron/FE;Transferrin) (83550-IBC) TLB-Amylase (82150-AMYL) TLB-Lipase (83690-LIPASE) TLB-Magnesium (Mg) (83735-MG) TLB-Sedimentation Rate (ESR) (85652-ESR) TLB-IgA (Immunoglobulin A) (82784-IGA) T-Beta Carotene [id number]) T-Sprue Panel (Celiac Disease Aby Eval) ([id number]) T-Stool Fats [place] Stain [phone]) T-Stool for O&P LQ:1409369) T- * Misc. 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 I've asked to continue all her other medications per Dr. Leanna Battles. She apparently has had recent vascular stent in her left external lower extremity per Dr. Tinnie Gens.  Problem # 6:  HEPATOMEGALY (L4387844.1) Assessment: Unchanged Fatty infiltration of the liver associated with hyperlipidemia and obesity. We will repeat her liver function test today. There's been no evidence of chronic hepatic insufficiency.  Problem # 7:  GERD (ICD-530.81) Assessment: Improved Continue standard reflux maneuvers and daily Nexium 40 mg before breakfast.  Patient Instructions: 1)  Please go to the basement for lab work. 2)  Begin Metronidazole three times a day for 2 weeks.  A prescription will be sent to your pharmacy. 3)  Take Align daily for 2 weeks.  Samples given. 4)  The medication list was reviewed and reconciled.  All changed / newly prescribed medications were explained.  A complete medication list was provided to the patient / caregiver. 5)  Stop Reglan 6)  Stool exams and labs ordered 7)  Copy sent to : Dr. Leanna Battles and Dr. Tinnie Gens. 8)  Please continue current medications.  9)  Please  schedule a follow-up appointment in 3 weeks.   Appended Document: bloating, diarrhea...em    Clinical Lists Changes  Medications: Added new medication of METRONIDAZOLE 250 MG TABS (METRONIDAZOLE) 1 po three times a day x 2 weeks - Signed Added new medication of ALIGN   CAPS (MISC INTESTINAL FLORA REGULAT) Take one capsule by mouth daily for 2 weeks Rx of METRONIDAZOLE 250 MG TABS (METRONIDAZOLE) 1 po three times a day x 2 weeks;  #42 x 0;  Signed;  Entered by: Alberteen Spindle RN;  Authorized by: Sable Feil MD [facility];  Method used: [facility] [address] [phone]*, [address] [place], [place], [place]  [address], Ph: [phone], Fax: [phone]    Prescriptions: METRONIDAZOLE 250 MG TABS (METRONIDAZOLE) 1 po three times a day x 2 weeks  #42 x 0   Entered by:   Alberteen Spindle RN   Authorized by:   Sable Feil MD [facility]   Signed by:   Alberteen Spindle RN on 03/16/2010   Method used:   [facility] [phone]* (retail)       [address]       [place], [place]  [address]       Ph: [phone]       Fax: [phone]   RxID:   [id number]

## 2010-11-13 NOTE — Progress Notes (Signed)
Summary: Nasobal not covered  Phone Note From Pharmacy   Caller: [facility] [phone][facility] Summary of Call: Pharmacist calling.  Nascobal not covered by medicare.  Talked with pt.  She will start monthly B12 injections instead. Initial call taken by: Alberteen Spindle RN,  April 25, 2010 4:19 PM    New/Updated Medications: CYANOCOBALAMIN 1000 MCG/ML INJ SOLN (CYANOCOBALAMIN) 1 cc Im  monthly.

## 2010-11-13 NOTE — Progress Notes (Signed)
Summary: Movi Prep  Phone Note Outgoing Call Call back at [facility] Phone [phone]   Call placed by: Bernita Buffy CMA Deborra Medina),  June 25, 2010 9:25 AM Call placed to: Patient Summary of Call: insurance will nto cover movi prep so I will leave sample at the front desk Initial call taken by: Bernita Buffy CMA Deborra Medina),  June 25, 2010 9:26 AM    New/Updated Medications: MOVIPREP 100 GM  SOLR (PEG-KCL-NACL-NASULF-NA ASC-C) As per prep instructions.

## 2010-11-15 NOTE — Assessment & Plan Note (Addendum)
Summary: MONTHLY B12 SHOT /JMS  Nurse Visit   Allergies: No Known Drug Allergies  Medication Administration  Injection # 1:    Medication: Vit B12 1000 mcg    Diagnosis: B12 DEFICIENCY (ICD-266.2)    Route: IM    Site: R deltoid    Exp Date: 07/2012    Lot #: [id number]    Mfr: American Regent    Patient tolerated injection without complications    Given by: Rosanne Sack RN (October 19, 2010 12:12 PM)  Orders Added: 1)  Vit B12 1000 mcg [id number]

## 2010-11-16 ENCOUNTER — Encounter: Payer: Self-pay | Admitting: Gastroenterology

## 2010-11-16 ENCOUNTER — Encounter (INDEPENDENT_AMBULATORY_CARE_PROVIDER_SITE_OTHER): Payer: Medicare Other

## 2010-11-16 ENCOUNTER — Ambulatory Visit: Admit: 2010-11-16 | Payer: Self-pay | Admitting: Gastroenterology

## 2010-11-16 DIAGNOSIS — E538 Deficiency of other specified B group vitamins: Secondary | ICD-10-CM

## 2010-11-21 NOTE — Assessment & Plan Note (Signed)
Summary: Monthly B12 Injection  Nurse Visit   Allergies: No Known Drug Allergies  Medication Administration  Injection # 1:    Medication: Vit B12 1000 mcg    Diagnosis: B12 DEFICIENCY (ICD-266.2)    Route: IM    Site: R deltoid    Exp Date: 08/14/2012    Lot #: [id number]    Mfr: American Regent    Comments: Monthly B12 injection, patient considering receiving injections from primary care physician because it is more convenient for her.    Patient tolerated injection without complications    Given by: June McMurray [place] Deborra Medina) (November 16, 2010 11:45 AM)  Orders Added: 1)  Vit B12 1000 mcg [J3420]   Medication Administration  Injection # 1:    Medication: Vit B12 1000 mcg    Diagnosis: B12 DEFICIENCY (ICD-266.2)    Route: IM    Site: R deltoid    Exp Date: 08/14/2012    Lot #: [id number]    Mfr: American Regent    Comments: Monthly B12 injection, patient considering receiving injections from primary care physician because it is more convenient for her.    Patient tolerated injection without complications    Given by: June McMurray [place] Deborra Medina) (November 16, 2010 11:45 AM)  Orders Added: 1)  Vit B12 1000 mcg [J3420]

## 2010-11-30 DIAGNOSIS — R635 Abnormal weight gain: Secondary | ICD-10-CM | POA: Insufficient documentation

## 2010-12-17 ENCOUNTER — Encounter (INDEPENDENT_AMBULATORY_CARE_PROVIDER_SITE_OTHER): Payer: Medicare Other

## 2010-12-17 ENCOUNTER — Encounter: Payer: Self-pay | Admitting: Gastroenterology

## 2010-12-17 DIAGNOSIS — E538 Deficiency of other specified B group vitamins: Secondary | ICD-10-CM

## 2010-12-25 NOTE — Assessment & Plan Note (Signed)
Summary: MONTLY B12 SHOT  Nurse Visit   Allergies: No Known Drug Allergies  Medication Administration  Injection # 1:    Medication: Vit B12 1000 mcg    Diagnosis: B12 DEFICIENCY (ICD-266.2)    Route: IM    Site: R deltoid    Exp Date: 08/2012    Lot #: [id number]    Mfr: American Regent    Patient tolerated injection without complications    Given by: Bernita Buffy CMA ([place]) (December 17, 2010 11:47 AM)  Orders Added: 1)  Vit B12 1000 mcg [id number]

## 2011-01-01 LAB — POCT I-STAT, CHEM 8
BUN: 24 mg/dL — ABNORMAL HIGH (ref 6–23)
Calcium, Ion: 1.11 mmol/L — ABNORMAL LOW (ref 1.12–1.32)
Chloride: 105 mEq/L (ref 96–112)
Glucose, Bld: 122 mg/dL — ABNORMAL HIGH (ref 70–99)
TCO2: 25 mmol/L (ref 0–100)

## 2011-01-02 ENCOUNTER — Ambulatory Visit: Payer: Medicare Other | Admitting: Cardiology

## 2011-01-03 ENCOUNTER — Other Ambulatory Visit: Payer: Self-pay | Admitting: *Deleted

## 2011-01-03 DIAGNOSIS — I2510 Atherosclerotic heart disease of native coronary artery without angina pectoris: Secondary | ICD-10-CM

## 2011-01-03 MED ORDER — FUROSEMIDE 20 MG PO TABS: 20.0000 mg | ORAL_TABLET | Freq: Every day | ORAL | Status: DC

## 2011-01-03 NOTE — Telephone Encounter (Signed)
escribe medication per fax request  

## 2011-01-04 ENCOUNTER — Other Ambulatory Visit: Payer: Self-pay | Admitting: *Deleted

## 2011-01-04 MED ORDER — PANCRELIPASE (LIP-PROT-AMYL) 20000-68000 UNITS PO CPEP: 2.0000 | ORAL_CAPSULE | Freq: Three times a day (TID) | ORAL | Status: DC

## 2011-01-04 MED ORDER — SUCRALFATE 1 GM/10ML PO SUSP: ORAL | Status: DC

## 2011-01-11 ENCOUNTER — Other Ambulatory Visit: Payer: Self-pay | Admitting: *Deleted

## 2011-01-11 DIAGNOSIS — I2510 Atherosclerotic heart disease of native coronary artery without angina pectoris: Secondary | ICD-10-CM

## 2011-01-11 MED ORDER — FUROSEMIDE 20 MG PO TABS: 20.0000 mg | ORAL_TABLET | Freq: Every day | ORAL | Status: DC

## 2011-01-11 NOTE — Telephone Encounter (Signed)
Error

## 2011-01-23 ENCOUNTER — Other Ambulatory Visit: Payer: Self-pay | Admitting: Gastroenterology

## 2011-01-23 MED ORDER — SUCRALFATE 1 GM/10ML PO SUSP: ORAL | Status: DC

## 2011-01-23 NOTE — Telephone Encounter (Signed)
Qty needed to be changed on the rx

## 2011-01-24 ENCOUNTER — Ambulatory Visit (INDEPENDENT_AMBULATORY_CARE_PROVIDER_SITE_OTHER): Payer: Medicare Other | Admitting: Gastroenterology

## 2011-01-24 ENCOUNTER — Telehealth: Payer: Self-pay | Admitting: Gastroenterology

## 2011-01-24 DIAGNOSIS — E1151 Type 2 diabetes mellitus with diabetic peripheral angiopathy without gangrene: Secondary | ICD-10-CM | POA: Insufficient documentation

## 2011-01-24 DIAGNOSIS — E538 Deficiency of other specified B group vitamins: Secondary | ICD-10-CM

## 2011-01-24 MED ORDER — CYANOCOBALAMIN 1000 MCG/ML IJ SOLN
1000.0000 ug | INTRAMUSCULAR | Status: DC
  Administered 2011-01-24: 1000 ug via INTRAMUSCULAR

## 2011-01-24 NOTE — Telephone Encounter (Signed)
[employer] [employer] I can not find where Dr Sharlett Iles has ever rxed her this medication and if they have the original rx they could fax it to me and I would take a look. She states that she does not have the rx since its the first time the pt has had the rx filled with [employer]. She will contact the patient and find out what MD rxed the drug for her.

## 2011-02-13 ENCOUNTER — Other Ambulatory Visit: Payer: Self-pay | Admitting: Otolaryngology

## 2011-02-14 ENCOUNTER — Ambulatory Visit
Admission: RE | Admit: 2011-02-14 | Discharge: 2011-02-14 | Disposition: A | Discharge status: Home / Self Care | Payer: Medicare Other | Source: Ambulatory Visit | Attending: Otolaryngology | Admitting: Otolaryngology

## 2011-02-25 ENCOUNTER — Ambulatory Visit (INDEPENDENT_AMBULATORY_CARE_PROVIDER_SITE_OTHER): Payer: Medicare Other | Admitting: Gastroenterology

## 2011-02-25 DIAGNOSIS — E538 Deficiency of other specified B group vitamins: Secondary | ICD-10-CM

## 2011-02-25 MED ORDER — CYANOCOBALAMIN 1000 MCG/ML IJ SOLN
1000.0000 ug | INTRAMUSCULAR | Status: DC
  Administered 2011-02-25 – 2011-04-30 (×3): 1000 ug via INTRAMUSCULAR

## 2011-02-26 NOTE — Assessment & Plan Note (Signed)
OFFICE VISIT   [name], [name]  DOB:  April 29, 1946                                       03/22/2010  [id number]   REASON FOR VISIT:  Followup left SFA stent.   Patient is a 65 year old Caucasian female who Dr. Kellie Simmering has been  following for several years for cerebrovascular disease and more  recently peripheral vascular disease.  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  She has been stable from a neurologic standpoint.  However, on her last visit with Dr. Kellie Simmering on 02/06/2010, she reported  left lower extremity claudication which occurred about at half a block.  She has had cold sensation in both feet but had no symptoms on the  right.  She continues to deny any rest pain or nonhealing ulcers.  ABIs  from [place] were 0.95 on the right and 0.72  on the left.  Dr. Kellie Simmering felt that she had a superficial femoral or  occlusive disease on the left, which she that would be amenable to  angioplasty and stenting.  Subsequently she underwent abdominal  aortogram, bilateral lower extremity runoff, and a stent to the left  superficial femoral artery by Dr. Napoleon Form on 02/20/2010.  Today she is here for her first visit following stent placement.   Chronic medical conditions are hypertension and coronary artery disease.  This is followed by Dr. Peter Martinique.  She reports she had a stress test  approximately 4 months ago.  She also has occluded left internal carotid  artery with some history of dizziness but no specific known hemispheric  events.  She continues to smoke 1 pack of cigarettes per day.   REVIEW OF SYSTEMS:  She reports weight gain as well as occasional chest  discomfort, although none today.  She also reports a history of a heart  murmur, reflux, problems with diarrhea and constipation, dizziness, nose  bleeds, which she reports is  related to her allergy and sinus issues.  She has had no recent nosebleeds, however.  She also has some left  shoulder joint pain which she said that she believes was aggravated when  she fell a day after undergoing her stent.  She says rest and ice seem  to help her symptoms.  She also has some occasional soreness in her left  .  She reports problems with depression as well.  She reports on  bilateral lower extremity edema slightly worse on the left leg, which is  worse at the end of the day and better with elevation.   ABIs today showed a 1.10 on the right and 1.0 on the left.  She had a  biphasic posterior tib and anterior tibial Doppler signals.   PHYSICAL EXAMINATION:  Physical exam findings show a blood pressure of  122/76 in her right arm, heart rate of 63, respirations 20.  General:  She is well-developed female in no acute distress.  Head is  normocephalic, atraumatic.  Lung sounds are clear throughout.  Heart has  a regular rate and rhythm.  I did not appreciate any bruits.  Abdomen is  obese and soft.  Lower extremities show mild pedal edema at  approximately 1+ on the left and trace on the right.  She has 2+  dorsalis  pedis pulses bilaterally.  There are no ulcerations.  She has  2+ radial pulses bilaterally.   Patient reports that her left leg claudication symptoms have improved  following her superficial femoral artery stent.  She remains on Plavix  and low-dose aspirin as well as her other medications, as listed in her  office chart.  We will plan on having her come back to our office for a  P scan and carotid duplex in 3 months.  She can call us sooner if any  issues arise in the interim.   Jacinta Shoe, PA   Judeth Cornfield. Scot Dock, M.D.  Electronically Signed   AWZ/MEDQ  D:  03/22/2010  T:  03/22/2010  Job:  [id number]   cc:   Peter M. Martinique, M.D.  Ermalene Searing Philip Aspen, M.D.

## 2011-02-26 NOTE — Procedures (Signed)
CAROTID DUPLEX EXAM   INDICATION:  Follow-up evaluation of known carotid artery disease.   HISTORY:  Diabetes:  No.  Cardiac:  Angina.  Hypertension:  Yes.  Smoking:  One-half pack per day.  Previous Surgery:  No.  CV History:  Previous duplex on May 18, 2007, and Angiogram on June 05, 2007, showed a 40% to 59% right ICA stenosis and an occluded left  ICA.  The patient has frequent dizzy spells.  Amaurosis Fugax No, Paresthesias No, Hemiparesis No.                                       RIGHT             LEFT  Brachial systolic pressure:         130               128  Brachial Doppler waveforms:         Triphasic         Triphasic  Vertebral direction of flow:        Antegrade         Antegrade  DUPLEX VELOCITIES (cm/sec)  CCA peak systolic                   69                50  ECA peak systolic                   111               123456  ICA peak systolic                   133               Occluded  ICA end diastolic                   50                Occluded  PLAQUE MORPHOLOGY:                  Mixed, irregular  Mixed  PLAQUE AMOUNT:                      Moderate          Severe  PLAQUE LOCATION:                    Proximal to mid ICA                 Throughout ICA   IMPRESSION:  1. A 40-59% right ICA stenosis.  2. Occluded left ICA.  3. No significant change from previous study.        ___________________________________________  Nelda Severe Kellie Simmering, M.D.   MC/MEDQ  D:  05/02/2009  T:  05/02/2009  Job:  [id number]

## 2011-02-26 NOTE — Procedures (Signed)
CAROTID DUPLEX EXAM   INDICATION:  Follow up for known coronary artery disease.   HISTORY:  Diabetes:  No.  Cardiac:  Angina.  Hypertension:  Yes.  Smoking:  Half pack per day.  Previous Surgery:  No.  CV History:  The patient has a known left internal carotid artery  occlusion.  The patient has reported periodic facial numbness and pain  over the last year, which has gotten worse in the last week.  The  patient reports occasional left arm numbness.  Amaurosis Fugax:  No. Paresthesias:  Yes.  Hemiparesis:  No.                                       RIGHT             LEFT  Brachial systolic pressure:         146.              148.  Brachial Doppler waveforms:         Triphasic.        Triphasic.  Vertebral direction of flow:        Antegrade.        Antegrade.  DUPLEX VELOCITIES (cm/sec)  CCA peak systolic                   70.               87.  ECA peak systolic                   131.              124.  ICA peak systolic                   125.              Occluded.  ICA end diastolic                   46.               Occluded.  PLAQUE MORPHOLOGY:                  Calcified, irregular.               Mixed.  PLAQUE AMOUNT:                      Severe.           Severe.  PLAQUE LOCATION:                    Proximal to mid ICA.                Throughout ICA.   IMPRESSION:  Occluded left internal carotid artery.   Forty to 59% right internal carotid artery stenosis.   The patient was offered an appointment with other physician, but  preferred to keep her appointment with Dr. Kellie Simmering on June 02, 2007.   ___________________________________________  Nelda Severe. Kellie Simmering, M.D.   MC/MEDQ  D:  05/18/2007  T:  05/18/2007  Job:  [id number]

## 2011-02-26 NOTE — Assessment & Plan Note (Signed)
[facility] OFFICE NOTE   NAME:Hocker, BARBARITA COONTZ                       MRN:          [id number]  DATE:12/29/2007                            DOB:          01-02-1946    Tessy continues to complain of constant pain in her right subcostal  area.  This is a constant pain but then again, she describes it as worse  immediately with eating, with belching and burping and acid reflux  symptoms despite taking Nexium 40 mg a day.  She currently also is on  Pamine Forte 5 mg twice a day and Questran four times a day and uses  p.r.n. Phenergan, hydrocodone, Ambien and apparently metoclopramide.   ADDITIONAL MEDICATIONS:  1. Caduet 10/20 mg a day.  2. Lisinopril 40 mg a day.  3. Effexor XR 150 mg a day.  4. Atenolol 50 mg a day.   After last clinic visit with marked tenderness over her right costal  margin, I placed her on Celebrex 200 mg twice a day.  Patient cannot  allegedly remember if this helped or not.  Her abdominal exam  continues to be unremarkable except for tenderness over her right costal  margin area.  I cannot appreciate any abdominal masses, evidence of  organomegaly or other abdominal abnormalities.   Review of her chart shows a normal nuclear hepatobiliary scan in October  of last year with 52% ejection and she has had a normal upper abdominal  ultrasound exam and CT scan except for fatty infiltration of her liver.  Endoscopy was done in January of this year which includes a small-bowel  biopsy that was normal and a gastric biopsy which showed no evidence of  Helicobacter infection.   RECOMMENDATIONS:  1. Repeat technetium gastric emptying scan to see if prokinetic agents      would be in order.  2. Continue empiric PPI therapy.  3. Consider referral to orthopedics for a long-acting steroid      injection of her right costochondral areas.  4. Continue other medications per Ermalene Searing. Philip Aspen, M.D.   ADDENDUM:  On review of the patient's office chart, which is somewhat  extended, she does have a long history of alternating diarrhea and  constipation, melanosis coli, chronic nausea, etc.     Loralee Pacas. Sharlett Iles, MD, Quentin O[place], [place]  Electronically Signed    DRP/MedQ  DD: 12/29/2007  DT: 12/30/2007  Job #: [id number]

## 2011-02-26 NOTE — Op Note (Signed)
[name], [name]                ACCOUNT NO.:  [id number]   MEDICAL RECORD NO.:  [id number]          PATIENT TYPE:  AMB   LOCATION:  SDS                          FACILITY:  [facility]   PHYSICIAN:  Theotis Burrow IV, MDDATE OF BIRTH:  July 19, 1946   DATE OF PROCEDURE:  DATE OF DISCHARGE:  06/05/2007                               OPERATIVE REPORT   PREOPERATIVE DIAGNOSIS:  Occluded left internal carotid artery.   POSTOPERATIVE DIAGNOSIS:  Occluded left internal carotid artery.   PROCEDURES PERFORMED:  1. Aortic arch angiogram.  2. Right carotid angiogram.  3. Second aortic cannulation (right carotid artery).   INDICATIONS FOR PROCEDURE:  [name] is a 65 year old female with  known left internal carotid occlusion.  She has had a change in her  clinical symptoms.  She has a mild to moderate stenosis on a duplex and  due to her symptomatology, we feel that this is best evaluated with an  arteriogram given her known occlusion on the contralateral side.  Risks  and benefits of the procedure were discussed with the patient and  informed consent was signed.   PROCEDURE:  The patient is identified in the holding area and taken to  room 8.  She is placed supine on the table.  Bilateral groins are  prepped and draped in standard, sterile fashion.  The right groin was  selected for access.  The subcutaneous tissue and skin were anesthetized  with 1% lidocaine.  The right common femoral artery was then accessed  with an 18-gauge needle.  An 0.035 wire was then advanced in a  retrograde fashion into the abdominal aorta under fluoroscopic  visualization.  Next, a 5-French sheath was placed.  Over the wire, a  pigtail cath was placed in the aortic arch and an arch arteriogram was  performed.  Next over the wire, the pigtail cath was exchanged out for a  headhunter catheter.  The headhunter catheter was used to selectively  cannulate the innominate artery and placed in the right common carotid  artery and a right carotid angiogram was obtained.  Intracerebral images  were obtained.  These will be dictated by the neuroradiologist.   At the conclusion of the procedure, the catheter was removed and manual  pressure was held until hemostatic.   FINDINGS:  Aortic arch angiogram:  Type 1 aortic arch is visualized.  The bilateral subclavian arteries are patent and vertebral arteries are  patent bilaterally.   Right carotid angiogram:  There is mild stenosis within the right  internal carotid artery.  There is cross-filling to the left side  through the right carotid artery injection.  The external carotid artery  and its branches are widely patent.   On the arch angiogram the occlusion of the left internal carotid artery  is, again, visualized.  The external carotid is patent.   IMPRESSION:  1. Type 1 aortic arch.  2. Occluded left internal carotid artery.  3. Mild stenosis of the right internal carotid artery.      Eldridge Abrahams, MD  Electronically Signed     VWB/MEDQ  D:  06/05/2007  T:  06/06/2007  Job:  [id number]

## 2011-02-26 NOTE — Assessment & Plan Note (Signed)
OFFICE VISIT   [name], [name]  DOB:  March 16, 1946                                       02/06/2010  [id number]   The patient is an old patient of mine known for carotid occlusive  disease referred today for evaluation of lower extremity claudication.  She was referred by Dr. Marygrace Drought.  She states she is able to walk only  about half a block before she has severe left calf pain which requires  her to stop.  This is relieved by rest.  She has cold sensation in both  feet but no calf claudication on the right side.  She denies any rest  pain, history of nonhealing ulcers.  She does have a history of severe  carotid disease with totally occluded left internal carotid in 2009, and  has had some dizziness in the past but no specific hemispheric TIAs.   Chronic medical problems include hypertension and coronary artery  disease.  She has had several cardiac caths in the past but has not had  any coronary artery bypass grafting procedures.   Currently she has occasional chest discomfort but no dyspnea on  exertion.  Does have reflux esophagitis, occasional bowel problems with  diarrhea or constipation, joint pain, depression.   PHYSICAL EXAMINATION:  Blood pressure 119/71, heart rate 70,  respirations 16.  General:  She is a well-developed female in no  apparent distress, alert and oriented x3.  HEENT:  Exam is unremarkable.  EOMs intact.  Chest:  Clear to auscultation.  Cardiovascular:  Regular  rhythm.  No murmurs.  ABDOMEN:  Obese.  No palpable masses.  Lower  extremity exam:  Reveals 3+ femoral pulses bilaterally.  Right leg has  2+ popliteal and 2+ dorsalis pedis.  Left leg has absent popliteal and  dorsalis pedis.  Both feet are adequately perfused at rest.  There is no  distal edema.   I reviewed the  lower extremity arterial Dopplers done at [facility]:  ABI is 0.95 on the right and 0.72 on the left.   I think she has  superficial femoral occlusive disease on the left which  may be amenable to angioplasty and stenting and she would like this  evaluated now.  I have arranged for her to have an angiogram by Dr.  Trula Slade on May 10 with possible PTA and stenting of her left superficial  femoral artery if feasible.     Nelda Severe Kellie Simmering, M.D.  Electronically Signed   JDL/MEDQ  D:  02/06/2010  T:  02/07/2010  Job:  [id number]

## 2011-02-26 NOTE — Assessment & Plan Note (Signed)
[facility] OFFICE NOTE   NAME:Strong, Joanna SIRAVO                       MRN:          [id number]  DATE:10/01/2007                            DOB:          01-Sep-1946    The patient continues to complain of pain in her right upper quadrant of  a constant unrelenting nature, continued abdominal gas and bloating.  Her workup today has been unremarkable including a recent CCK Hida scan  on August 06, 2007.  She has had previous ultrasound which has shown  fatty liver, but otherwise has been unremarkable.  Recent liver function  tests in August of this past year were normal.  She has a chronically  mild leukocytosis of 12,000 to 14,000 with a slightly elevated sed rate  of 30 of unexplained etiology.  She does suffer from chronic  hypoalbuminemia also of unexplained etiology.   She recently in addition to her abdominal pain has had gas and bloating  and a pruritic skin rash mostly on her trunk and back.  She has not had  clay colored stools, dark urine, icterus, fever or chills, anorexia or  weight loss.  She is continued on Nexium 40 mg a day for acid reflux and  takes for hypertension Caduet 10/20 a day, lisinopril 40 mg a day, and  for depression takes Effexor 150 mg a day.  She also is on atenolol 50  mg a day, Pamine forte 5 mg twice a day, and p.r.n. Questran 4 grams  with juice because of diarrhea.  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   PHYSICAL EXAMINATION:  GENERAL:  She is a chronically ill-appearing  white female in no acute distress.  She appears older than her stated  age.  VITAL SIGNS:  She weighs 190 pounds which is up some 15 pounds from  January of 2007.  Blood pressure 104/68 and pulse was 88 and regular.  I  could not appreciate definite stigmata of chronic liver disease, but she  said she did have some scabbing lesions on her  extremities and posterior  back area.  I could not appreciate any other abdominal mass and there  was no scleral icterus or palmar erythema.  CHEST:  Generally clear and she appeared to be in a regular rhythm  without murmurs, rubs, or gallops.  ABDOMEN:  She has somewhat obese abdomen, but I could not appreciate any  definite organomegaly.  The only mass I could appreciate was what  appeared to be a subcutaneous 5-6 cm area in the superior umbilical  area.  There was no definite abdominal wall hernia.  Bowel sounds were  normal.  There were no abdominal bruits.  EXTREMITIES:  There was +1 peripheral edema, but no pitting.  NEUROLOGY:  Her mental status was clear.   ASSESSMENT:  1. Right upper quadrant pain probably related to fatty infiltration of      the liver.  However, she does appear to have an abnormal abdominal      examination and I think we need to check  abdominal/pelvic CT.  2. Pruritic skin lesions of unexplained etiology, perhaps related to      occult chronic liver disease consider PCT.  3. Hypertensive cardiovascular disease.  4. Chronic depression on Effexor therapy.   RECOMMENDATIONS:  1. Repeat laboratory parameters and complete hepatic workup including      viral and serologic markers.  Also obtain ammonia levels and      prothrombin time.  2. Abdominal/pelvic CT scan.  3. Consider liver biopsy examination.  4. Continue above mentioned medications and prescribe Aldactone 25 mg      twice a day with office follow-up once workup completed.     Loralee Pacas. Sharlett Iles, MD, Quentin Ore, [place]  Electronically Signed    DRP/MedQ  DD: 10/01/2007  DT: 10/02/2007  Job #: [id number]   cc:   Ermalene Searing. Philip Aspen, M.D.

## 2011-02-26 NOTE — Assessment & Plan Note (Signed)
 [facility]                         GASTROENTEROLOGY OFFICE NOTE   NAME:RUMLEYTana, Joanna                       MRN:          [id number]  DATE:07/28/2007                            DOB:          [birth year]    Ms. Strong says she continues to have abdominal gas, bloating,  diarrhea, and right upper quadrant pain, especially after eating  almost every day.  Despite her complaints of diarrhea, she had  colonoscopy which confirmed melanosis coli.  I have not seen her since  April 2007, at which time her ultrasound showed fatty infiltration of  her liver.  She has a history of previous hypoalbuminemia, but repeat  liver function tests in August of this year were normal with a serum  albumin of 3.8 gm%.  Her CBC is normal, except she has a very mild  chronic leukocytosis with recent white count 12.2, but normal  differential.   PRIMARY CARE PHYSICIAN:  Dr. Leanna Battles.   She relates that her family history is remarkably positive for  gallbladder disease, but nobody had stones.  She denies rectal  bleeding, nausea, or vomiting.  She has had several colonoscopy exams by  myself and Dr. Earlean Shawl, also previous endoscopy, which has showed  evidence of acid reflux disease.   She is taking:  1. Nexium 40 mg a day.  2. Caduet 10/20 daily.  3. __________ 40 mg a day.  4. Effexor XR 150 mg a day.  5. Atenolol 50 mg a day for hypertension.   She is a healthy-appearing white female appearing her stated age.  She weighs 189 pounds, which is pretty much her normal weight.  Blood  pressure was 144/78.  Pulse was 68 and regular.  I could not appreciate stigmata of chronic liver disease.  She had a  very obese abdomen.  Her liver was slightly enlarged in the right upper  quadrant with a firm tender edge.  Abdominal exam, otherwise, was  unremarkable.   ASSESSMENT:  1. Fatty infiltration of the liver with capsular distention.  2. Rule out chronic gallbladder  dysfunction.  3. Possible bacterial overgrowth syndrome.  4. History of chronic laxative abuse.   RECOMMENDATIONS:  1. CCK-HIDA scan.  2. Repeat Xifaxan-Align therapy for bacterial overgrowth syndrome.  3. Followup p.r.n. as needed.     [name]. Joanna Iles, MD, Quentin Ore, [place]  Electronically Signed    DRP/MedQ  DD: 07/28/2007  DT: 07/29/2007  Job #: [id number]

## 2011-02-26 NOTE — Assessment & Plan Note (Signed)
OFFICE VISIT   [name], [name]  DOB:  [birth year]                                       06/02/2007  [id number]   The patient returns for her followup of carotid occlusive disease,  having last been seen 2 years ago in February.  She recently has had  some new central nervous system symptoms, including numbness on the left  side of her face, tingling of her tongue, some visual abnormalities in  her left eye as well as pain in her left eye on occasion and some  headaches.  She has had no symptoms specifically on the right side of  her body, although she complains occasionally of some numbness in both  upper extremities intermittently.  This is not predictable.  She is  taking 1 aspirin a day.  She continues to have multiple symptoms,  including some chest discomfort as well as dyspnea on exertion and  abdominal discomfort at times.  She will be evaluated by Dr. Peter  Martinique tomorrow for her cardiac symptoms.  She has previously undergone  cardiac catheterization.   PHYSICAL EXAMINATION:  Her blood pressure is 104/62, heart rate is 72,  respirations are 18.  Carotid pulses are 3+.  Soft bruit on the right.  Neurologic exam was normal.  No palpable adenopathy in the neck.  Chest:  Clear to auscultation.  Cardiovascular exam:  Reveals a regular rhythm  with no murmurs.  Abdomen:  Soft, nontender, with no masses.  She has 2-  3+ femoral pulses palpable bilaterally.  Both feet are well perfused.   She continues to have significant risk factors including a strong family  history for stroke and coronary artery disease, and continues to smoke  1/2 pack of cigarettes per day.  She has a known left internal carotid  occlusion for many years, and we have been following a mild-to-moderate  right internal carotid stenosis which is essentially unchanged on duplex  scanning today, although she does have severe plaque formation with  calcified irregular plaque.   She may be embolizing from a right carotid bifurcation, and we will plan  to proceed with the cerebral angiogram later this week to further  evaluate this and to determine if she needs a right carotid  endarterectomy.   Nelda Severe Kellie Simmering, M.D.  Electronically Signed   JDL/MEDQ  D:  06/02/2007  T:  06/03/2007  Job:  [id number]   cc:   Peter M. Martinique, M.D.  Ermalene Searing Philip Aspen, M.D.

## 2011-02-26 NOTE — Procedures (Signed)
LOWER EXTREMITY ARTERIAL DUPLEX   INDICATION:  Followup evaluation of stent placement.   HISTORY:  Diabetes:  No.  Cardiac:  No.  Hypertension:  Yes.  Smoking:  Yes.  Previous Surgery:  Left SFA stent placed 02/20/2010.   SINGLE LEVEL ARTERIAL EXAM                          RIGHT                LEFT  Brachial:               123                  128  Anterior tibial:        129                  120  Posterior tibial:       120                  120  Peroneal:  Ankle/Brachial Index:   1.01                 0.94   LOWER EXTREMITY ARTERIAL DUPLEX EXAM   DUPLEX:  Left superficial femoral artery appears with triphasic/biphasic  waveforms proximal to, within and distal to the stent.   IMPRESSION:  1. Ankle brachial indices are within normal limits bilaterally.  2. Patent left superficial femoral artery stent with velocities      attached on the following worksheet.   ___________________________________________  Nelda Severe. Kellie Simmering, M.D.   EM/MEDQ  D:  09/03/2010  T:  09/03/2010  Job:  [id number]

## 2011-02-26 NOTE — Consult Note (Signed)
NAMEKASSY, PONTECORVO                ACCOUNT NO.:  [id number]   MEDICAL RECORD NO.:  [id number]          PATIENT TYPE:  AMB   LOCATION:  SDS                          FACILITY:  [facility]   PHYSICIAN:  San Morelle, MDDATE OF BIRTH:  1946/08/12   DATE OF CONSULTATION:  DATE OF DISCHARGE:  06/05/2007                                 CONSULTATION   REFERRING PHYSICIAN:  Dr. Nelda Severe. Kellie Simmering   The images from the patient's recent carotid arteriogram are submitted  for review of the intracranial runs.  A right common carotid injection  was performed with both a P and lateral imaging over the head.  This  demonstrates crossflow of the anterior communicating artery to fill both  A2 segments.  Additionally, there is retrograde flow in the left A1  segment and some flash filling of the left middle cerebral artery.  This  suggests inflow from what has been defined as an occluded left internal  carotid artery.  The route of reconstitution is not identified from this  study.  There are no focal intracranial stenoses, aneurysms or branch  vessel occlusions on the right.  Posterior communicating artery does not  significantly fill from this injection.   IMPRESSIONS:  1. No focal stenosis, aneurysm or branch vessel occlusion identified      on these two views.  2. Cross filling of the anterior communicating artery with retrograde      filling of left A1 segment and flash filling of left MCA.  This      suggests there is some inflow from the terminal left internal      carotid artery.      San Morelle, MD  Electronically Signed     CM/MEDQ  D:  06/12/2007  T:  06/12/2007  Job:  [id number]

## 2011-02-26 NOTE — Assessment & Plan Note (Signed)
OFFICE VISIT   [name], [name]  DOB:  [birth year]                                       05/02/2009  [id number]   The patient returns for continued followup regarding her carotid  occlusive disease.  She has a known left internal carotid occlusion  which we have been following for several years and a 40-50% right  internal carotid stenosis.  She was last seen here 2 years ago.  She has  not had any specific neurologic symptoms such as hemiparesis, aphasia,  amaurosis fugax, diplopia, blurred vision or syncope but does have  dizziness particularly when she arises which lasts only for a few  seconds and does not result in any syncope.  She occasionally will have  some chest discomfort.  She does not have severe claudication symptoms  or rest pain.   PHYSICAL EXAM:  Blood pressure 118/78 supine which increases to 126/86  with an immediate change to the sitting position.  Her heart rate is  also unchanged.  Her carotid pulses are 3+ and soft bruit on the right.  Neurologic exam is normal.  Chest is clear to auscultation.  Cardiovascular exam is regular rhythm, no murmurs.  Abdomen is soft,  nontender, with no masses.  She has 3+ femoral pulses bilaterally.   Carotid duplex exam today continues to reveal left internal carotid  occlusion and a mild to moderate right internal carotid stenosis in the  50% range.   I do not think her carotid disease is severe enough to cause the  dizziness and she does not appear to have postural type hypotension.  If  this should continue to be problematic I would recommend a referral to a  neurologist to further check into this.  Will continue to watch her  carotid disease on an annual basis in the vascular lab.   Nelda Severe Kellie Simmering, M.D.  Electronically Signed   JDL/MEDQ  D:  05/02/2009  T:  05/03/2009  Job:  [id number]   cc:   Ermalene Searing. Philip Aspen, M.D.

## 2011-02-26 NOTE — Assessment & Plan Note (Signed)
[facility] OFFICE NOTE   NAME:Roddy, [name]                       MRN:          [id number]  DATE:11/10/2007                            DOB:          December 09, 1945    [name] has had an extensive repeat GI workup including endoscopy,  colonoscopy, CT scanning, and numerous laboratory parameters all of  which have been normal.  She continues with pain in right upper quadrant  and subxiphoid air which is definitely musculoskeletal on exam today.  She also had CT scan and mild fatty infiltration of the liver.  She in  the past has had some mild hypertransaminasemia, but repeat liver  function tests were normal.  There was mild hepatomegaly on exam with a  prominent left lobe of the liver.   I had a long talk today with [name] about her problem, but I think most  of her abdominal pain is musculoskeletal.  She has definite tenderness  over the costochondral areas and the tip of her xiphoid process.  I  placed her on Celebrex 200 mg twice a day, and referred her to a  dietician for a weight loss program.  Her normal weight is approximately  140 pounds, and she has gained 50 pounds of weight over the last several  years.  I have asked her to continue all of her other multiple  medications listed and reviewed in her chart.  I will see her back in  one month's time for followup.     Loralee Pacas. Sharlett Iles, MD, Quentin Ore, [place]  Electronically Signed    DRP/MedQ  DD: 11/10/2007  DT: 11/11/2007  Job #: [id number]   cc:   Ermalene Searing. Philip Aspen, M.D.
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
## 2011-02-26 NOTE — Procedures (Signed)
CAROTID DUPLEX EXAM   INDICATION:  Followup evaluation of known coronary artery disease.   HISTORY:  Diabetes:  No.  Cardiac:  No.  Hypertension:  Yes.  Smoking:  Yes.  Previous Surgery:  Left SFA stent placed 03/02/2010.  CV History:  Known left ICA occlusion.  Amaurosis Fugax No, Paresthesias No, Hemiparesis No                                       RIGHT             LEFT  Brachial systolic pressure:         123               128  Brachial Doppler waveforms:         Normal            Normal  Vertebral direction of flow:        Antegrade  DUPLEX VELOCITIES (cm/sec)  CCA peak systolic                   [id number]  ECA peak systolic                   95  ICA peak systolic                   [id number]  ICA end diastolic                   68  PLAQUE MORPHOLOGY:                  Calcific  PLAQUE AMOUNT:                      Moderate  PLAQUE LOCATION:                    ICA   IMPRESSION:  1. 40%-59% stenosis of the right internal carotid artery.  2. Left internal carotid artery known occlusion.  3. Right internal carotid artery velocities have increased from      previous study, however, velocities still remain in the 40%-59%      stenosis range.   ___________________________________________  Nelda Severe. Kellie Simmering, M.D.   EM/MEDQ  D:  09/03/2010  T:  09/03/2010  Job:  [id number]

## 2011-03-01 NOTE — Cardiovascular Report (Signed)
NAMEELDER, WILGER                ACCOUNT NO.:  [id number]   MEDICAL RECORD NO.:  [id number]          PATIENT TYPE:  INP   LOCATION:  4729                         FACILITY:  [facility]   PHYSICIAN:  Peter M. Martinique, M.D.  DATE OF BIRTH:  Oct 20, 1945   DATE OF PROCEDURE:  03/04/2005  DATE OF DISCHARGE:                              CARDIAC CATHETERIZATION   INDICATIONS FOR PROCEDURE:  The patient is a 65 year old white female with a  history of tobacco abuse, hypertension, hypercholesterolemia. She had been  noncompliant with her medications. She presented with severe chest and left  shoulder pain and had ruled out for myocardial infarction.   PROCEDURES:  Left heart catheterization, coronary and left ventricular  angiography access via the right femoral artery using standard Seldinger  technique.   EQUIPMENT:  A 6-French JR-4 catheter, 6-French pigtail catheter, 7-French  left 3.0 millimeter guide to engage the left coronary ostium.   MEDICATIONS:  Local anesthesia 1% Xylocaine, Versed 2 milligrams IV.   CONTRAST:  A 175 cc of Omnipaque.   HEMODYNAMIC DATA:  1.  Aortic pressure was 168/81 with mean of 117.  2.  Left ventricle pressure 158 with an EDP of 7 mmHg.   ANGIOGRAPHIC DATA:  1.  The left coronary arises from a small root with upward takeoff.  2.  Left main coronary is normal.  3.  The left anterior descending artery has a very focal stenosis in the      midvessel following a small second diagonal branch that is approximately      60-70% narrowed. The remainder of the LAD is without significant      disease.  4.  The first diagonal branch is without significant disease.  5.  Left circumflex coronary has minor irregularities proximally. His rise      to two small marginal vessels which appear normal.  6.  The right coronary arises and distributes normally. It is a dominant      vessel. There is a segmental 30% narrowing in the midvessel.   LEFT VENTRICULAR ANGIOGRAPHY:   Performed in RAO view. This demonstrates  normal left ventricular size and contractility with normal systolic  function. Ejection fraction is estimated 65%. There is no mitral  regurgitation or prolapse.   FINAL INTERPRETATION:  1.  Modest single vessel coronary disease involving the mid left anterior      descending artery.  2.  Normal left ventricular function.  3.  No significant change compared to prior angiograms in 1999 and 2002.   PLAN:  The plan would recommend continued medical therapy.      PMJ/MEDQ  D:  03/04/2005  T:  03/04/2005  Job:  [id number]   cc:   Ermalene Searing. Philip Aspen, M.D.  [address]  [place]  [place] [address]  Fax: [phone]

## 2011-03-01 NOTE — Cardiovascular Report (Signed)
[place]. [facility]  Patient:    [name], [name] Visit Number: [id number] MRN: [id number]          Service Type: MED Location: [phone] Attending Physician:  Martinique, Peter Manning Dictated by:   Peter M. Martinique, M.D. Proc. Date: 08/31/01 Admit Date:  08/28/2001   CC:         Ermalene Searing. Philip Aspen, M.D.  Mayme Genta, M.D.   Cardiac Catheterization  PROCEDURE:  Cardiac catheterization.  CARDIOLOGIST:  Peter M. Martinique, M.D.  INDICATION FOR PROCEDURE:  The patient is a 65 year old white female with a history of diabetes and coronary artery disease who presents with refractory epigastric chest pain and nausea.  ACCESS:  Via the left femoral artery.  Right femoral artery stick was attempted, but we were unable to access the right groin.  We were able to access the left groin without difficulty.  COMMENTARY:  Engagement of the left coronary ostium was extremely difficult. This related to the patients small aortic root size and very superior takeoff of the left coronary artery.  Multiple catheters were used including 6-French JL4, JL3.5, AL1, and multipurpose A2 catheters without being able to engage the coronary ostium.  We then switched to a 7-French 3.5 guide.  With this, we were finally able to engage the left coronary ostium.  MEDICATIONS:  Local anesthesia with 1% Xylocaine, Versed 2 mg IV.  CONTRAST: 250 cc of Omnipaque.  HEMODYNAMIC DATA:  Aortic pressure 200/99 with a mean of 143.  Left ventricular pressure is 199 with EDP of 13 mmHg.  ANGIOGRAPHIC DATA:  Left coronary artery as mentioned has a superior takeoff.  Left main coronary artery has mild calcification without significant disease.  Left anterior descending artery has a 70% stenosis in the mid vessel at the takeoff of a very small second diagonal branch.  This is a fusiform lesion. The first diagonal is a small branch which has an 80 to 90% stenosis at  its takeoff.  The left circumflex coronary artery arises and distributes normally.  There is a 20 to 30% stenosis in the mid vessel.  The right coronary artery arises and distributes normally.  It is a dominant vessel.  There is 20 to 30% disease at the ostium and in the proximal vessel. The remainder of the right coronary artery is without significant disease.  Left ventricular angiography was performed in the RAO view.  This demonstrates normal left ventricular size and contractility with normal systolic function. Ejection fraction is estimated at 75%.  There is no mitral regurgitation or prolapse.  The proximal aorta appears normal.  Abdominal aortic angiography was performed.  This demonstrates a small aorta without significant disease.  There is no aneurysmal dilatation.  The iliac runoff is good with no obstruction.  The mesenteric and renal arteries showed no significant obstruction.  FINAL INTERPRETATION: 1. Single-vessel obstructive atherosclerotic coronary artery disease with    moderate stenosis in the mid left anterior descending artery. 2. Normal left ventricular function. 3. Unremarkable abdominal aortography. 4. Comparison was made with previous cardiac catheterization dated    July 07, 1998, and there is no significant change.Dictated by:   Collier Salina . Martinique, M.D. Attending Physician:  Martinique, Peter Manning DD:  08/31/01 TD:  08/31/01 Job: [id number]

## 2011-03-01 NOTE — H&P (Signed)
[place]. [facility]  Patient:    [name], [name] Visit Number: [id number] MRN: [id number]          Service Type: MED Location: [phone] Attending Physician:  Martinique, Peter Manning Dictated by:   Peter M. Martinique, M.D. Admit Date:  08/28/2001   CC:         Loralee Pacas. Sharlett Iles, M.D. [facility]  Mayme Genta, M.D.   History and Physical  HISTORY OF PRESENT ILLNESS:  The patient is a 65 year old white female with a history of coronary artery disease who presents with prolonged symptoms of chest pain and epigastric pain today.  The patient states that he pain has been going off and on for the last five weeks.  It is described primarily as epigastric, burning pain associated with nausea.  There has been no relation to meals or activity.  Today she had similar symptoms, only more severe associated movements substernal chest pain.  On two occasions she did get relief with sublingual nitroglycerin, but then her pain recurred and she presented to the emergency room.  The patient has had recent upper GI evaluation by Dr. Earlean Shawl.  She reports she had an abdominal ultrasound which was negative.  She had upper endoscopy which showed mild irritation with a small hiatal hernia.  She also had some type of barium study.  This was also negative.  PAST MEDICAL HISTORY:  1. ASCAD.  She had a cardiac catheterization in September of 1999 which     showed a 70-80% mid to distal LAD stenosis.  There was also a 90% stenosis     of the small diagonal branch, 50% stenosis in the mid circumflex and 50%     stenosis in the right coronary artery.  The patient has been treated     medically since then with multiple medications.  2. Hypertension.  3. Hypercholesterolemia.  4. Obstructive sleep apnea.  5. History of tobacco abuse.  6. Depression.  7. 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  8. The patient has a history of headaches.  PAST SURGICAL HISTORY:  Status post a partial hysterectomy, T&A, bilateral breast implants and previous ventral hernia repair.  MEDICATIONS:  1. Prilosec 20 mg b.i.d.  2. Premarin 0.625 mg daily.  3. Imdur 60 mg b.i.d.  4. Altace 10 mg q.d.  5. Atenolol 50 mg b.i.d.  6. Lipitor 20 mg q.d.  7. Norvasc 5 mg q.d.  8. Diltiazem 180 mg q.d.  9. Aspirin daily. 10. Nerve pill daily.  ALLERGIES:  IVP DYE AND CODEINE.  SOCIAL HISTORY:  The patient works at [employer].  She quit smoking in March of this year.  She denies alcohol abuse.  She is divorced and has two children.  FAMILY HISTORY:  Father died at age 18 with an aneurysm and CVA.  Mother had hypertension.  REVIEW OF SYSTEMS:  The patient has noted no change in her bowel habits.  She denies any blood in her stools.  She does have occasional swelling in her ankles.  Otherwise review of systems are negative.  PHYSICAL EXAMINATION:  GENERAL:  The patient is an obese white female in mild distress.  VITAL SIGNS:  Blood pressure 150/90, pulse 70 and regular, she is afebrile.  HEENT:  Pupils equal, round and reactive.  Funduscopic exam is benign. Oropharynx is clear.  NECK:  Supple without JVD,  adenopathy, thyromegaly or bruits.  LUNGS:  Clear.  CARDIOVASCULAR:  Reveals regular rate and rhythm without murmurs, rubs, gallops or clicks.  ABDOMEN:  Soft with epigastric tenderness to palpation.  There are no masses or bruits.  EXTREMITIES:  Without edema.  Pulses are 2+ and symmetric.  NEUROLOGIC:  Intact.  LABORATORY DATA:  Chest x-ray shows no active disease.  ECG shows normal sinus rhythm and nonspecific ST-T wave abnormality.  White count is 11,900, hemoglobin 13.6, hematocrit 39.4, platelets 354,000, sodium 138, potassium 3.9, chloride 106, CO2 23, BUN 22, creatinine 0.8, glucose 130.  IMPRESSION: 1. Prolonged epigastric and chest pain.  Rule-out acute coronary  ischemia.  By    exam her pain would seem to be more gastrointestinal related.  Need to    rule-out pancreatitis.  Also consider mesenteric ischemia given her known    history of vascular disease. 2. Hypertension. 3. Cerebrovascular disease. 4. Hypercholesterolemia. 5. Gastroesophageal reflux disease.  PLAN:  The patient will be admitted and ruled out for myocardial infarction. Will continue her usual medications.  Will also place her on IV nitroglycerin and subcu Lovenox.  Will obtain liver function studies, amylase and lipase. The patient will probably require repeat cardiac catheterization on Monday and compare with her prior study three years ago. Dictated by:   Peter M. Martinique, M.D. Attending Physician:  Martinique, Peter Manning DD:  08/28/01 TD:  08/28/01 Job: [id number]

## 2011-03-01 NOTE — H&P (Signed)
NAMEJAMAYRA, SCHOLER NO.:  [id number]   MEDICAL RECORD NO.:  [id number]          PATIENT TYPE:  EMS   LOCATION:  MAJO                         FACILITY:  [facility]   PHYSICIAN:  Peter M. Martinique, M.D.  DATE OF BIRTH:  [birth year]   DATE OF ADMISSION:  03/04/2005  DATE OF DISCHARGE:                                HISTORY & PHYSICAL   HISTORY OF PRESENT ILLNESS:  [name] is a 65 year old white woman who  is admitted to [facility] for further evaluation of chest pain.   The patient has a history of known coronary artery disease.  She has  undergone cardiac catheterization on approximately 3 previous occasions; the  last cardiac catheterization was in 2002.  It demonstrated a 70% lesion in  the mid LAD.  There was also an 80% to 90% stenosis in the origin of a small  first diagonal.  The circumflex had a 20%  to 30% mid-portion.  The dominant  right coronary artery demonstrated 20% to 30% lesion __________ .  Left  ventricular function was normal with an ejection fraction of 65%.  Her  coronary disease has been treated medically; she has never undergone  intervention.   The patient presented to the emergency department with a 2-day history of  chest discomfort.  The chest discomfort is described as a soreness or ache  in her left anterior chest and left shoulder.  It is associated with a band  of pressure across her chest and around to her back.  She experienced  several hours of discomfort, followed by several hours of relief, followed  by several hours of recurrence.  This pattern has continued over the course  of the last 2 days.  The discomfort appears to be unrelated to position,  activity, meals, or respirations.  There are no exacerbating or ameliorating  factors.  She has not taken any nitroglycerin.  There has been no associated  dyspnea, diaphoresis or nausea.  The discomfort has remitted for the time-  being.  She otherwise feels well.  This is  similar to the chest discomfort  which she has experienced in the past.   The patient has never suffered a myocardial infarction.  There is no history  of congestive heart failure or arrhythmia.   The patient has a number of risk factors for coronary artery disease; these  include hypertension, dyslipidemia and smoking (currently 1/2 pack of  cigarettes per day; previously 1 pack of cigarettes per day).  There is no  history of diabetes mellitus.  There is no family history of early coronary  artery disease, though her suffers from carotid artery disease.   The patient has known cerebrovascular disease with a 95% left vertebral  artery stenosis, a 50% right internal carotid artery stenosis, and a 100%  left carotid artery.   PAST MEDICAL HISTORY:  Other medical problems include:  1.  Gastroesophageal reflux.  2.  Obstructive sleep apnea.  3.  Depression.   ALLERGIES:  None.   MEDICATIONS:  The patient is unsure of her medications; she believes they  include  Flonase, Altace, and some other blood pressure medications.   PREVIOUS SURGERIES:  Hysterectomy, left __________ .   SOCIAL HISTORY:  The patient lives alone.  She has been laid off from her  full-time job and now works part-time at [employer].  She is divorced.  She does not drink.  She smokes as described above.   FAMILY HISTORY:  Father suffers from cerebrovascular disease.  Family  history is otherwise unremarkable.   REVIEW OF SYSTEMS:  Review of systems reveals no new problems related to her  head, eyes, ears, nose, mouth, throat, lungs, gastrointestinal system,  genitourinary system, or extremities.  There is no history of neurologic or  psychiatric disorder.  There is no history of fever, chills, or weight loss.   PHYSICAL EXAMINATION:  VITAL SIGNS:  Blood pressure 178/97.  Pulse 85 and  regular.  Respirations 20.  Temperature 98.3.  GENERAL:  The patient was an obese, middle-aged white woman in no   discomfort.  She was alert, oriented, appropriate, and responsive.  HEENT:  Head, eyes, nose, and mouth were normal.  NECK:  The neck was without thyromegaly or adenopathy.  Carotid pulses were  diminished in intensity bilaterally.  CARDIAC:  Examination reveals a normal S1 and S2.  There was no S3, S4,  murmur, rub, or click.  Cardiac rhythm was regular.  No chest wall  tenderness was noted.  LUNGS:  The lungs were clear.  ABDOMEN:  The abdomen was soft and nontender.  There was no mass,  hepatosplenomegaly, bruit, distention, rebound, guarding, or rigidity.  Bowel sounds were normal.  BREASTS, PELVIC AND RECTAL:  Examinations were not performed as they were  not pertinent to the reason for acute care hospitalization.  EXTREMITIES:  The extremities were without edema, deviation or deformity.  Radial and dorsalis pedal pulses were palpable bilaterally.  NEUROLOGIC:  Brief screening neurologic survey was unremarkable.   LABORATORY AND ACCESSORY CLINICAL DATA:  The electrocardiogram revealed  sinus tachycardia with occasional PVCs.  There were no changes of acute  myocardial infarction.   Chest radiograph, according to the radiologist, demonstrated left basilar  atelectasis.   The initial set of cardiac markers revealed a CK-MB of 1.4, myoglobin of  47.8, and troponin of less than 0.05.  Second set of cardiac markers  revealed a CK-MB of less than 1.0, myoglobin 34.4 and troponin less than  0.05.  Potassium was 3.7 with a BUN of 19 and creatinine of 0.8.  The  remaining studies were pending at the time of this dictation.   IMPRESSION:  1.  Chest pain, rule out cardiac ischemia.  The patient has experienced      intermittent left chest and left shoulder soreness for 2 days.  2.  Coronary artery disease.  Seventy percent mid left anterior descending      lesion at last cardiac catheterization in 2002.  Luminal irregularities     in other vessels.  She has not undergone any prior  coronary artery      interventions.  3.  Hypertension.  4.  Cerebrovascular disease.  5.  Dyslipidemia.  6.  Gastroesophageal reflux.  7.  Obstructive sleep apnea.  8.  Depression.   PLAN:  1.  Telemetry.  2.  Serial cardiac enzymes.  3.  Aspirin.  4.  Intravenous heparin.  5.  Intravenous nitroglycerin.  6.  Metoprolol.  7.  Fasting lipid profile.  8.  Discontinuation of smoking discussed with patient.  9.  Further measures per Dr.  Martinique.      MSC/MEDQ  D:  03/04/2005  T:  03/04/2005  Job:  [id number]

## 2011-03-01 NOTE — Op Note (Signed)
[place]. [facility]  Patient:    [name], [name]                         MRN: [id number] Proc. Date: 11/11/00 Adm. Date:  [id number] Attending:  Joylene Grapes CC:         Loralee Pacas. Sharlett Iles, M.D. [facility]  Jeneen Rinks D. Kellie Simmering, M.D.  Gordy Clement, M.D.  Peter M. Martinique, M.D.   Operative Report  PREOPERATIVE DIAGNOSIS: 1. Left knee, rule out medial and lateral meniscal tears. 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  POSTOPERATIVE DIAGNOSIS: 1. 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 2. Large medial and lateral plicas. 3. Right lateral retinaculum. 4. Intact menisci.  OPERATION PERFORMED: 1. Left knee operative arthroscopy with abrasion chondroplasties and ablator    posterior patella and medial tibial plateau. 2. Excision medial and lateral plicas. 3. Arthroscopic lateral retinacular release.  SURGEON:  Vernell Leep, M.D.  ASSISTANT:  Will Moye, PA-C  ANESTHESIA:  General endotracheal.  CULTURES:  None.  DRAINS:  None.  ESTIMATED BLOOD LOSS:  Minimal.  TOURNIQUET TIME:  40 minutes.  PATHOLOGIC FINDINGS AND HISTORY:  Cambelle has had two years ago presentation with medial left knee symptoms.  She had had six months of symptoms prior to that.  She had pain over the medial aspect of the knee, no frank catching, locking, with some popping.  Medical doctor had treated her with anti-inflammatories ____________ saw her January 05, 1999.  We injected her knee with cortisone.  X-ray showed some medial compartment narrowing but not bone-on-bone.  She initially resolved her complaints but over time has had recurrence of symptoms with tenderness at the medial joint line.  We treated her also for some corns in the interim.  The knee intermittently bothering her.  Then by July 07, 2000 we saw her, felt that she had degenerative medial and lateral meniscal tears actually more  painful laterally on [place] with some patellofemoral arthritic change and a mild Bakers cyst. We thought she was coming to arthroscopy, possibly Hyalgan later.  We reinjected the knee with cortisone Marcaine.  She continued to have some relief initially with the injection, 1+ patellofemoral crepitation noted on the visit August 04, 2000.  October 15, 2000 continued to have discomfort. Pain was back as it was before her injection and she desired to proceed with arthroscopy.  She had several medical problems and was followed by Dr. Martinique and Dr. Kellie Simmering and problems with her carotids.  Ultimately got clearance.  At surgery today, she had large medial and lateral plicas over the entire trochlea.  She had chondromalacia patella on the lateral patellar facet grade 2 to 3 debrided to three quarters of the way into the substance and was smoothed with the ablator.  She had a tight lateral retinaculum, lateral patellar ____________ .  Her menisci were actually intact.  ACL was intact. There was a quarter sized area of posterior medial tibial plateau that debrided to bone very easily so this was grade 3 to 4.  We will probably need to go to Hyalgan.  We did smooth the unstable articular cartilage, did a lateral release and removed the plicas.  Again, the menisci were probed and found to be intact.  DESCRIPTION OF PROCEDURE:  With adequate anesthesia obtained using endotracheal technique, 1 gm Ancef given IV prophylaxis, the patient was placed in supine position, the  left lower extremity was prepped from the malleoli to the leg holder in standard fashion.  After standard prepping and draping, superolateral inflow portal was made.  The knee was insufflated with normal saline with the arthroscopic pump.  Medial and lateral scope portals were then made and the joint was thoroughly inspected.  We then thoroughly inspected the joint and we then cleaned out the medial and lateral gutters with plicas  back to the sidewalls and lysed the medial and lateral bands.  I then checked the ACL, both menisci, shaved the posterior medial tibial plateau, smoothed it with an ablator.  I then shaved and then used the ablator to smooth the lateral patellar facet and then did an arthroscopic lateral retinacular release from the vastus lateralis to the jointline nicely releasing the patella and improving the gliding and tracking and even balance of the patella within the groove.  The trochlea was intact.  Bleeding points were cauterized.  The knee was irrigated through the scope.  0.5% Marcaine injected in and about the portals.  The portals were left open.  A bulky sterile compressive dressing was applied with a lateral foam pad for tamponade and E-Z wrap placed.  The patient then having tolerated the procedure well was awakened and taken to the recovery room in satisfactory condition to be discharged per outpatient routine and told to call the office for appointment for recheck tomorrow.  Crutches weightbearing as tolerated, given Tylox for pain. DD:  11/11/00 TD:  11/11/00 Job: [id number]

## 2011-03-01 NOTE — Discharge Summary (Signed)
[name], [name]                ACCOUNT NO.:  [id number]   MEDICAL RECORD NO.:  [id number]          PATIENT TYPE:  INP   LOCATION:  4729                         FACILITY:  [facility]   PHYSICIAN:  Peter M. Martinique, M.D.  DATE OF BIRTH:  [birth year]   DATE OF ADMISSION:  03/04/2005  DATE OF DISCHARGE:  03/06/2005                                 DISCHARGE SUMMARY   HISTORY OF PRESENT ILLNESS:  [name] is a 65 year old white female with  history of hypertension, dyslipidemia, coronary artery disease, and tobacco  abuse who presented with symptoms of left chest and left shoulder pain.  This pain had been persistent for two days.  Initially began in her anterior  chest and radiated to her left shoulder.  She does have a known history of  coronary disease with prior cardiac catheterization demonstrated a 70%  stenosis in the mid LAD that has been treated medically.  Of note, the  patient had lost her insurance and is currently unemployed.  As a result,  she quit taking her medications and was severely hypertensive when she  presented to the hospital.  For details of her past medical history, social  history, family history, and physical examination, please see admission  history and physical.   LABORATORY DATA:  White count 13,600, hemoglobin 13.7, hematocrit 40.7,  platelets 291,000.  Coags were normal.  Sodium 138, potassium 3.7, chloride  106, CO2 24, glucose of 88, BUN of 19, creatinine 0.8, magnesium 2.  All  other chemistries were normal.  Serial cardiac enzymes including point of  care cardiac enzymes were negative x2 and then subsequent serial enzymes  were negative x2.  BNP level was normal at 43.  Total cholesterol was 159,  triglycerides 133, HDL 46, LDL of 86.  ECG showed normal sinus rhythm with  occasional PVCs.  Otherwise no acute changes.  Chest x-ray showed no active  disease.   HOSPITAL COURSE:  Patient is admitted to telemetry.  She was initially  placed on metoprolol  and Altace.  Blood pressure improved and she was  subsequently also was started back on her Norvasc.  She was initially  treated with IV nitroglycerin and IV heparin.  She had persistent left  shoulder pain.  On the afternoon of Mar 04, 2005 she did undergo cardiac  catheterization.  This demonstrated modest stenosis in the mid LAD at 60-  70%.  Remainder of the coronaries were without significant disease.  Ejection fraction was normal at 65%.  Her calf findings were unchanged  compared to 1999 and to 2002.  Based on these findings we recommended  continued medical therapy.  At this point her left shoulder pain had  resolved.  On the morning following her cardiac catheterization she  complained of pain in her right groin and had evidence of an expanding  hematoma.  Ultrasound confirmed a pseudoaneurysm.  She underwent ultrasound-  guided compression of pseudoaneurysm successfully on Mar 05, 2005.  This was  done with IV conscious sedation.  Repeat ultrasound the following morning  showed no persistent leak.  Hemoglobin remained stable at  12.6.  At this  point it was felt that the patient was stable for discharge.  Blood pressure  at time of discharge was ranging anywhere from 123456 to [id number] systolic.  Diastolic pressures were normal.  Heart rhythm was normal.  Due to her  pseudoaneurysm her aspirin was held.   DISCHARGE DIAGNOSIS:  1.  Chest and left shoulder pain, noncardiac, resolved.  2.  Modest single-vessel coronary disease, chronic.  3.  Hypertension poorly controlled due to medical noncompliance.  4.  History of carotid arterial disease.  5.  Dyslipidemia.  6.  Gastroesophageal reflux disease.  7.  History of depression.  8.  History of obstructive sleep apnea.  9.  Pseudoaneurysm right groin.  10. Tobacco abuse.   DISCHARGE MEDICATIONS:  1.  Prilosec 20 mg per day.  2.  Norvasc 10 mg per day.  3.  Lipitor 20 mg per day.  4.  Atenolol 50 mg per day.  5.  Lisinopril 40 mg per  day.   Patient is instructed not to take aspirin.  Recommended she not smoke.  Avoid lifting or straining for one week.  Will follow up with Dr. Martinique in  one week.  Discharge status is improved.      PMJ/MEDQ  D:  03/06/2005  T:  03/06/2005  Job:  [id number]   cc:   Ermalene Searing. Philip Aspen, M.D.  [address]  [place]  [place] [address]  Fax: [phone]

## 2011-03-01 NOTE — Discharge Summary (Signed)
[facility]  Patient:    [name], [name] Visit Number: [id number] MRN: [id number]          Service Type: MED Location: [phone] Attending Physician:  Martinique, Peter Manning Dictated by:   Peter M. Martinique, M.D. Admit Date:  08/28/2001 Disc. Date: 08/31/01   CC:         [name]. Philip Aspen, M.D.  Mayme Genta, M.D.   Discharge Summary  HISTORY OF PRESENT ILLNESS:  [name] is a 65 year old white female who has a known history of coronary disease.  She presents with complaint primarily of epigastric discomfort and nausea.  She has also experienced symptoms of chest pain.  This has been worsening over the past five weeks.  She has some recent GI evaluation by Dr. Earlean Shawl and it was recommended that she double her Prilosec.  Abdominal ultrasound was apparently negative.  Due to her prior cardiac history, she was admitted for further stabilization and evaluation of coronary status.  The patient does have a history of hypertension, hyperlipidemia, and obstructive sleep apnea.  She has a prior history of tobacco abuse.  For details of her past medical history, social history, and family history, please see admission history and physical.  LABORATORY DATA:  ECG demonstrated normal sinus rhythm and normal ECG.  Chest x-ray showed no active disease.  Chemistry panel was normal with the exception of albumin of 2.9.  Lipase was normal at 18.  Amylase was normal.  CBC was normal.  Lipid profile showed a total cholesterol of 142, triglycerides of 165, HDL 47, LDL 62.  Serial cardiac enzymes were all negative.  Repeat ECG was normal.  HOSPITAL COURSE:  The patient was admitted and initially placed on IV nitroglycerin and subcutaneous Lovenox.  Her pain, nausea, and epigastric discomfort did all improve during her hospital stay.  She was otherwise maintained on her usual medications.  On August 31, 2001, she underwent cardiac  catheterization.  This demonstrated a 70% stenosis in the mid LAD.  This was a somewhat fusiform lesion.  The LAD was small in diameter, estimated between 2 mm and 2.5 mm. There was also a very small first diagonal branch which had an 80-90% ostial stenosis.  Otherwise, no significant obstructive disease was noted.  Left ventricular function was normal.  Abdominal aortography was also unremarkable. Comparison was made with her previous cardiac catheterization in September 1999, and there was no significant change.  Based on the lack of change on her angiography, with moderate single-vessel coronary disease, it was felt that her recent symptoms could not be explained based on her coronary status.  The patient was discharged home after a period of bed rest.  We recommended continuing on her current medical regimen and following up with Dr. Earlean Shawl for further consideration of GI source of discomfort.  The patient was discharged home in stable condition on August 31, 2001.  DISCHARGE MEDICATIONS:  1. Prilosec 20 mg twice a day.  2. Premarin 0.625 mg daily.  3. Imdur 60 mg twice a day.  4. Altace 10 mg daily.  5. Atenolol 50 mg twice a day.  6. Lipitor 20 mg per day.  7. Norvasc 5 mg daily.  8. Diltiazem 180 mg daily.  9. Aspirin coated daily. 10. Effexor 75 mg daily.  DIET:  The patient is to remain on a low salt and low fat diet.  SPECIAL INSTRUCTIONS:  She is to continue to abstain from smoking.  FOLLOWUP:  The  patient will arrange followup appointment with Dr. Earlean Shawl.  DISCHARGE DIAGNOSES: 1. Chest and epigastric pain, etiology undetermined. 2. Atherosclerotic coronary artery disease, moderate single-vessel disease. 3. Hypertension. 4. Hyperlipidemia. 5. Prior history of tobacco abuse. 6. History of cerebrovascular disease. 7. Gastroesophageal reflux disease. Dictated by:   Peter M. Martinique, M.D. Attending Physician:  Martinique, Peter Manning DD:  08/31/01 TD:   08/31/01 Job: [id number]

## 2011-03-26 ENCOUNTER — Encounter: Payer: Self-pay | Admitting: Cardiology

## 2011-03-28 ENCOUNTER — Ambulatory Visit (INDEPENDENT_AMBULATORY_CARE_PROVIDER_SITE_OTHER): Payer: Medicare Other | Admitting: Gastroenterology

## 2011-03-28 ENCOUNTER — Encounter (INDEPENDENT_AMBULATORY_CARE_PROVIDER_SITE_OTHER): Payer: Medicare Other

## 2011-03-28 ENCOUNTER — Other Ambulatory Visit (INDEPENDENT_AMBULATORY_CARE_PROVIDER_SITE_OTHER): Payer: Medicare Other

## 2011-03-28 DIAGNOSIS — Z48812 Encounter for surgical aftercare following surgery on the circulatory system: Secondary | ICD-10-CM

## 2011-03-28 DIAGNOSIS — I6529 Occlusion and stenosis of unspecified carotid artery: Secondary | ICD-10-CM

## 2011-03-28 DIAGNOSIS — I739 Peripheral vascular disease, unspecified: Secondary | ICD-10-CM

## 2011-03-28 DIAGNOSIS — E538 Deficiency of other specified B group vitamins: Secondary | ICD-10-CM

## 2011-03-28 NOTE — Procedures (Unsigned)
LOWER EXTREMITY ARTERIAL DUPLEX  INDICATION:  Followup peripheral vascular disease.  HISTORY: Diabetes:  Yes, this is new since previous study in November Cardiac:  No Hypertension:  Yes Smoking:  Previous, quit 8 weeks ago Previous Surgery:  Left superficial femoral artery stent 02/20/2010  SINGLE LEVEL ARTERIAL EXAM                         RIGHT                LEFT Brachial:               130                  135 Anterior tibial:        111                  109 Posterior tibial:       118                  103 Peroneal: Ankle/Brachial Index:   0.87                 0.81  ANKLE BRACHIAL INDEX FROM 09/03/2010:  Right:  1.01 Left:  0.94   LOWER EXTREMITY ARTERIAL DUPLEX EXAM  DUPLEX: 1. Elevated velocities present involving the left distal external     iliac artery suggestive of stenosis in the 50% to 75% range. 2. The left superficial femoral artery stent in the mid/ distal thigh     to knee segment appears patent.     IMPRESSION: 1. Stenosis present as noted above. 2. The left superficial femoral artery stent patent as noted above. 3. Ankle-brachial indices decreased bilaterally since previous study     on 09/03/2010.  ___________________________________________ Nelda Severe. Kellie Simmering, M.D.  SH/MEDQ  D:  03/28/2011  T:  03/28/2011  Job:  [id number]

## 2011-04-09 ENCOUNTER — Other Ambulatory Visit (INDEPENDENT_AMBULATORY_CARE_PROVIDER_SITE_OTHER): Payer: Medicare Other

## 2011-04-09 ENCOUNTER — Ambulatory Visit (INDEPENDENT_AMBULATORY_CARE_PROVIDER_SITE_OTHER): Payer: Medicare Other | Admitting: Gastroenterology

## 2011-04-09 ENCOUNTER — Encounter: Payer: Self-pay | Admitting: Gastroenterology

## 2011-04-09 DIAGNOSIS — D509 Iron deficiency anemia, unspecified: Secondary | ICD-10-CM | POA: Insufficient documentation

## 2011-04-09 DIAGNOSIS — Z131 Encounter for screening for diabetes mellitus: Secondary | ICD-10-CM | POA: Insufficient documentation

## 2011-04-09 DIAGNOSIS — R109 Unspecified abdominal pain: Secondary | ICD-10-CM

## 2011-04-09 DIAGNOSIS — R6889 Other general symptoms and signs: Secondary | ICD-10-CM | POA: Insufficient documentation

## 2011-04-09 DIAGNOSIS — R197 Diarrhea, unspecified: Secondary | ICD-10-CM

## 2011-04-09 DIAGNOSIS — K861 Other chronic pancreatitis: Secondary | ICD-10-CM

## 2011-04-09 DIAGNOSIS — K219 Gastro-esophageal reflux disease without esophagitis: Secondary | ICD-10-CM

## 2011-04-09 DIAGNOSIS — J45909 Unspecified asthma, uncomplicated: Secondary | ICD-10-CM | POA: Insufficient documentation

## 2011-04-09 LAB — SEDIMENTATION RATE: Sed Rate: 32 mm/hr — ABNORMAL HIGH (ref 0–22)

## 2011-04-09 LAB — CBC WITH DIFFERENTIAL/PLATELET
Basophils Absolute: 0.1 10*3/uL (ref 0.0–0.1)
Eosinophils Absolute: 0.4 10*3/uL (ref 0.0–0.7)
HCT: 42.8 % (ref 36.0–46.0)
Hemoglobin: 14.5 g/dL (ref 12.0–15.0)
Lymphs Abs: 3.5 10*3/uL (ref 0.7–4.0)
MCHC: 34 g/dL (ref 30.0–36.0)
Monocytes Relative: 6.4 % (ref 3.0–12.0)
Neutro Abs: 7.5 10*3/uL (ref 1.4–7.7)
RDW: 12.8 % (ref 11.5–14.6)

## 2011-04-09 MED ORDER — AMBULATORY NON FORMULARY MEDICATION: Status: DC

## 2011-04-09 NOTE — Patient Instructions (Addendum)
Please go to the basement today for your labs.  Stop the Reglan and start Domperidone 10mg  three times a day before meals, we have sent your rx to [facility] their phone number is [phone].  Your abdominal ultrasound is scheduled for 04/11/2011 at [facility], please arrive at 7:45am and have nothing to eat or drink after midnight.  Follow the step 3 gastroparesis diet given to you today.

## 2011-04-09 NOTE — Progress Notes (Signed)
This is a very complicated 65 year old Caucasian female with suspected chronic pancreatitis with associated chronic malabsorption, B12 deficiency, and associated gastroparesis with severe secondary to GERD. She continues with intermittent episodes of abdominal pain with nausea and vomiting, but no specific hepatobiliary complaints. She has a very strong family history of gallbladder disease in her mother and several aunts. She currently is on twice a day Nexium, Reglan 5 mg 3 times a day, pancreatic extract, when necessary liquid carefully, and multiple medications for hypertensive cardiovascular disease including daily Plavix and Neurontin for peripheral neuropathy. She recently has been found to have diabetes and apparently is only every morning NovoLog injection. Primary care physician is Dr. Leanna Battles. Last endoscopy exam was in October 2011. She is up-to-date for colonoscopy exams. She continues to not abuse of alcohol, cigarettes, or NSAIDs. Distal problems include asthmatic bronchitis and hyperlipidemia. Her cardiologist is Dr. Peter Martinique, and she apparently has peripheral vascular disease and sees Dr. Victorino Dike vascular surgery.  Current Medications, Allergies, Past Medical History, Past Surgical History, Family History and Social History were reviewed in [employer] record.  Pertinent Review of Systems Negative... she denies current cardiovascular or pulmonary complaints, but does have reduced exercise tolerance. Patient also denies frequent antibiotic use, anorexia, weight loss, fever chills. She does have chronic insomnia takes daily Ambien. There is no known history of acute hepatitis or pancreatitis.  Past Medical History  Diagnosis Date  . Coronary artery disease   . GERD (gastroesophageal reflux disease)     hiatial hernia  . Dyslipidemia   . Dizziness     chronic  . Carotid artery disease     documented occlusion of left ICA  . Depression   . Peripheral  vascular disease     s/p stent of left SFA  . Normal nuclear stress test     nml 01/03/10;06/19/07  . Arthropathy, unspecified, site unspecified   . Irritable bowel syndrome   . Melanosis coli   . Unspecified essential hypertension   . Other, mixed, or unspecified nondependent drug abuse, unspecified   . Hepatomegaly   . Hiatal hernia   . Fatty liver   . Vitamin B12 deficiency   . Pancreatitis, chronic   . Blind loop syndrome   . Diabetes insipidus    Past Surgical History  Procedure Date  . Breast enhancement surgery   . Tonsillectomy   . Wrist surgery     right  . Abdominal hysterectomy     partial  . Cardiac catheterization 07/07/98;08/31/01;03/04/05    60 -70% LAD  . Femoral artery stent     Left leg    reports that she quit smoking about 5 months ago. Her smoking use included Cigarettes. She has a 30 pack-year smoking history. She has never used smokeless tobacco. She reports that she does not drink alcohol or use illicit drugs. family history includes Colon cancer in her paternal grandfather; Hypertension in her father and mother; and Stroke in an unspecified family member. Allergies  Allergen Reactions  . Codeine   . Iodinated Diagnostic Agents Rash      Physical Exam: Cannot appreciate stigmata of chronic liver disease. Chest is clear cardiac exam shows no murmurs gallops or rubs. She appears to be in a regular rhythm. She is a somewhat obese abdomen but no definite organomegaly, masses, tenderness, or abnormal bowel sounds. There is some mottling of her skin diffusely. I cannot appreciate peripheral edema, phlebitis, or swollen joints. Mental status is clear.  Assessment and Plan: Complicated patient with multiple comorbid medical problems and cardiovascular problems. Her symptoms do not seem consistent with ischemic colitis or ischemic bowel problems. She has a very strong family history of cholelithiasis, and I have scheduled upper abdominal ultrasound and lab  review. I will try to discontinue her Reglan and place her on domperidone 10 mg 3 times a day for gastroparesis associated with her chronic pancreatitis. I'm concerned as she has such severe persistent reflux symptoms, but she certainly is not a good candidate for any type of fundoplication surgery. She is to continue her pancreatic extract, PPI twice a day, and when necessary liquid Carafate pending further results. She is to continue to see primary care for diabetic management.  Please copy her primary care physician, referring physician, and pertinent subspecialists. Encounter Diagnoses  Name Primary?  . Esophageal reflux   . Diarrhea   . Abdominal pain   . Iron deficiency anemia, unspecified    . Other general symptoms

## 2011-04-09 NOTE — Procedures (Unsigned)
CAROTID DUPLEX EXAM  INDICATION:  Follow up carotid stenosis.  HISTORY: Diabetes:  Yes. Cardiac:  No. Hypertension:  Yes. Smoking:  Previous, quit 8 weeks ago. Previous Surgery:  No carotid endarterectomies performed; known left internal carotid artery occlusion. CV History:  Asymptomatic. Amaurosis Fugax No, Paresthesias No, Hemiparesis No.                                      RIGHT             LEFT Brachial systolic pressure:         130               135 Brachial Doppler waveforms:         WNL               WNL Vertebral direction of flow:        Antegrade         Antegrade DUPLEX VELOCITIES (cm/sec) CCA peak systolic                   94 ECA peak systolic                   99991111 ICA peak systolic                   [id number] ICA end diastolic                   41 PLAQUE MORPHOLOGY:                  Heterogenous PLAQUE AMOUNT:                      Mild-to-moderate PLAQUE LOCATION:                    CCA, ICA, ECA  IMPRESSION: 1. Right common carotid artery disease present with vessel tortuosity. 2. Right external carotid artery stenosis present. 3. Right internal carotid artery stenosis in the 40% to 59% range. 4. Essentially unchanged since previous study on 09/04/2011.  ___________________________________________ Nelda Severe. Kellie Simmering, M.D.  SH/MEDQ  D:  03/28/2011  T:  03/28/2011  Job:  [id number]

## 2011-04-10 LAB — BASIC METABOLIC PANEL
GFR: 58.68 mL/min — ABNORMAL LOW (ref >60.00)
Potassium: 4.4 mEq/L (ref 3.5–5.1)
Sodium: 141 mEq/L (ref 135–145)

## 2011-04-10 LAB — AMYLASE: Amylase: 33 U/L (ref 27–131)

## 2011-04-10 LAB — IBC PANEL: Iron: 55 ug/dL (ref 42–145)

## 2011-04-10 LAB — HEPATIC FUNCTION PANEL
Alkaline Phosphatase: 97 U/L (ref 39–117)
Bilirubin, Direct: 0.1 mg/dL (ref 0.0–0.3)

## 2011-04-10 LAB — TSH: TSH: 1.86 u[IU]/mL (ref 0.35–5.50)

## 2011-04-10 LAB — LIPASE: Lipase: 26 U/L (ref 11.0–59.0)

## 2011-04-10 LAB — CELIAC PANEL 10: Tissue Transglut Ab: 12.2 U/mL (ref <20)

## 2011-04-11 ENCOUNTER — Ambulatory Visit (HOSPITAL_COMMUNITY)
Admission: RE | Admit: 2011-04-11 | Discharge: 2011-04-11 | Disposition: A | Discharge status: Home / Self Care | Payer: Medicare Other | Source: Ambulatory Visit | Attending: Gastroenterology | Admitting: Gastroenterology

## 2011-04-11 DIAGNOSIS — R197 Diarrhea, unspecified: Secondary | ICD-10-CM

## 2011-04-11 DIAGNOSIS — R109 Unspecified abdominal pain: Secondary | ICD-10-CM | POA: Insufficient documentation

## 2011-04-11 DIAGNOSIS — K7689 Other specified diseases of liver: Secondary | ICD-10-CM | POA: Insufficient documentation

## 2011-04-11 DIAGNOSIS — R112 Nausea with vomiting, unspecified: Secondary | ICD-10-CM | POA: Insufficient documentation

## 2011-04-11 DIAGNOSIS — K219 Gastro-esophageal reflux disease without esophagitis: Secondary | ICD-10-CM

## 2011-04-16 LAB — TRYPSINOGEN, BLOOD

## 2011-04-26 DIAGNOSIS — Z008 Encounter for other general examination: Secondary | ICD-10-CM | POA: Insufficient documentation

## 2011-04-30 ENCOUNTER — Encounter: Payer: Self-pay | Admitting: Gastroenterology

## 2011-04-30 ENCOUNTER — Ambulatory Visit (INDEPENDENT_AMBULATORY_CARE_PROVIDER_SITE_OTHER): Payer: Medicare Other | Admitting: Gastroenterology

## 2011-04-30 VITALS — BP 94/60 | HR 72 | Wt 202.2 lb

## 2011-04-30 DIAGNOSIS — K861 Other chronic pancreatitis: Secondary | ICD-10-CM | POA: Insufficient documentation

## 2011-04-30 DIAGNOSIS — E669 Obesity, unspecified: Secondary | ICD-10-CM | POA: Insufficient documentation

## 2011-04-30 DIAGNOSIS — K219 Gastro-esophageal reflux disease without esophagitis: Secondary | ICD-10-CM

## 2011-04-30 DIAGNOSIS — K7689 Other specified diseases of liver: Secondary | ICD-10-CM

## 2011-04-30 DIAGNOSIS — E538 Deficiency of other specified B group vitamins: Secondary | ICD-10-CM

## 2011-04-30 DIAGNOSIS — K599 Functional intestinal disorder, unspecified: Secondary | ICD-10-CM | POA: Insufficient documentation

## 2011-04-30 DIAGNOSIS — K760 Fatty (change of) liver, not elsewhere classified: Secondary | ICD-10-CM

## 2011-04-30 DIAGNOSIS — K5989 Other specified functional intestinal disorders: Secondary | ICD-10-CM

## 2011-04-30 MED ORDER — CYANOCOBALAMIN 1000 MCG/ML IJ SOLN: 1000.0000 ug | INTRAMUSCULAR | Status: AC

## 2011-04-30 NOTE — Patient Instructions (Signed)
We gave you a b12 injection today on your way out today please make your next b12 for one month.  Call back in 3 weeks with an update to see if you should continue Domperidone or not.

## 2011-04-30 NOTE — Progress Notes (Signed)
This is a complicated 65 year old Caucasian female with multiple medical problems multiple medications including overriding of cardiac drugs and Plavix. His chronic GERD managed with Nexium 40 mg twice a day. She also has chronic, chronic abdominal gas and bloating with suspected  GI motility disorder but that she's had a previously normal gastric emptying scan. I have discontinued her Reglan and she currently is on domperidone 10 mg 3 times a day before meals with some improvement in her GI symptoms. Reviewed her records in detail and she has otherwise had a thorough recurrent GI evaluation including small bowel biopsy that was normal. Spinal his complaints she has not had any anorexia or weight loss or hepatobiliary problems.  Current Medications, Allergies, Past Medical History, Past Surgical History, Family History and Social History were reviewed in [employer] record.  Pertinent Review of Systems Negative.. she denies current cardiovascular or pulmonary complaints. She does not exercise regularly has limited exercise tolerance. He is on B12 replacement therapy, and occurred on for chronic pain syndrome, and also pancreatic extract for suspected chronic pancreatitis. She currently denies GI revealed significant abdominal pain. She had been treated unsuccessfully in the past for bacterial overgrowth syndrome.   Physical Exam: Obese patient in no acute distress. Her abdomen shows no definite organomegaly, masses or tenderness. Bowel sounds are normal and I cannot appreciate an abdominal bruit. No stigmata of chronic liver disease, chest is clear, and cardiac exam is unremarkable. Her mental status is normal.    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 She seems to be doing better on domperidone prokinetic therapy, and we will continue this medication with discontinuation of Pamine Fort at bedtime.  Recent labs were reviewed and were unremarkable although she has fatty infiltration of her liver all abdominal imaging exams. We renewed all her pancreatic extracts, Nexium, and domperidone. I see no need for further GI evaluation at this time. Therapeutic trials the past also of probiotics have not been useful. Encounter Diagnosis  Name Primary?  . Vitamin B12 deficiency Yes

## 2011-05-02 DIAGNOSIS — E559 Vitamin D deficiency, unspecified: Secondary | ICD-10-CM | POA: Insufficient documentation

## 2011-05-15 ENCOUNTER — Encounter: Payer: Self-pay | Admitting: Cardiology

## 2011-05-15 ENCOUNTER — Ambulatory Visit (INDEPENDENT_AMBULATORY_CARE_PROVIDER_SITE_OTHER): Payer: Medicare Other | Admitting: Cardiology

## 2011-05-15 VITALS — BP 108/80 | HR 81 | Ht 63.0 in | Wt 199.8 lb

## 2011-05-15 DIAGNOSIS — I10 Essential (primary) hypertension: Secondary | ICD-10-CM

## 2011-05-15 DIAGNOSIS — I2510 Atherosclerotic heart disease of native coronary artery without angina pectoris: Secondary | ICD-10-CM

## 2011-05-15 NOTE — Patient Instructions (Signed)
Continue your current medications  I will see you again in 6 months.   

## 2011-05-17 ENCOUNTER — Encounter: Payer: Self-pay | Admitting: Cardiology

## 2011-05-17 NOTE — Assessment & Plan Note (Signed)
Blood pressure is well controlled on current medical therapy.

## 2011-05-17 NOTE — Progress Notes (Signed)
[name] Date of Birth: [birth year]   History of Present Illness: [name] is seen today for followup. She states she has not been feeling well. She has a lot of indigestion symptoms and feels at times that her throat is on fire. This is particularly worse at night. She has been seeing [name] for treatment of her GI symptoms. She did quit smoking in January. She really denies any significant anginal symptoms. She does have a known history of coronary disease with a 60-70% stenosis in the mid LAD. She has had multiple repeated nuclear stress test which have been normal.  Current Outpatient Prescriptions on File Prior to Visit  Medication Sig Dispense Refill  . amLODipine (NORVASC) 10 MG tablet Take 10 mg by mouth daily.        Marland Kitchen aspirin 81 MG tablet Take 81 mg by mouth daily.        Marland Kitchen atenolol (TENORMIN) 50 MG tablet Take 50 mg by mouth daily.        Marland Kitchen atorvastatin (LIPITOR) 20 MG tablet Take 20 mg by mouth daily.        . B Complex Vitamins INJ Inject as directed every 30 (thirty) days.        . clopidogrel (PLAVIX) 75 MG tablet Take 75 mg by mouth daily.        Marland Kitchen desipramine (NOPRAMIN) 10 MG tablet Take 10 mg by mouth at bedtime.        Marland Kitchen esomeprazole (NEXIUM) 40 MG capsule Take 40 mg by mouth 2 (two) times daily.        . furosemide (LASIX) 20 MG tablet Take 1 tablet (20 mg total) by mouth daily.  90 tablet  3  . gabapentin (NEURONTIN) 300 MG capsule Take 300 mg by mouth 3 (three) times daily.        Marland Kitchen HYDROcodone-acetaminophen (VICODIN) 5-500 MG per tablet Take 1 tablet by mouth as needed.        . Liraglutide (VICTOZA) 18 MG/3ML SOLN Inject 1.8 mg into the skin daily.        Marland Kitchen lisinopril (PRINIVIL,ZESTRIL) 40 MG tablet Take 40 mg by mouth daily.        . methscopolamine (PAMINE FORTE) 5 MG tablet Take 5 mg by mouth at bedtime.        . montelukast (SINGULAIR) 10 MG tablet Take 10 mg by mouth daily.        . Pancrelipase, Lip-Prot-Amyl, (ZENPEP) 20000 UNITS CPEP Take 2 capsules  (40,000 Units total) by mouth 3 (three) times daily.  180 capsule  3  . sucralfate (CARAFATE) 1 GM/10ML suspension Take 5 CC after meals and 10 CC at bedtime  2250 mL  1  . venlafaxine (EFFEXOR-XR) 150 MG 24 hr capsule Take 150 mg by mouth daily.        Marland Kitchen zolpidem (AMBIEN) 10 MG tablet Take 10 mg by mouth at bedtime.        . AMBULATORY NON FORMULARY MEDICATION Medication Name: Domperidone 10mg  take one tablet by mouth before meals three times a day  240 capsule  3   Current Facility-Administered Medications on File Prior to Visit  Medication Dose Route Frequency Provider Last Rate Last Dose  . cyanocobalamin ((VITAMIN B-12)) injection 1,000 mcg  1,000 mcg Intramuscular Q30 days Joanna Blalock, MD   1,000 mcg at 04/30/11 1518  . cyanocobalamin ((VITAMIN B-12)) injection 1,000 mcg  1,000 mcg Intramuscular Q30 days Joanna Blalock, MD  Allergies  Allergen Reactions  . [name]   . [name] Rash    Past Medical History  Diagnosis Date  . Coronary artery disease   . GERD (gastroesophageal reflux disease)     hiatial hernia  . Dyslipidemia   . Dizziness     chronic  . Carotid artery disease     documented occlusion of left ICA  . Depression   . Peripheral vascular disease     s/p stent of left SFA  . Normal nuclear stress test     nml 01/03/10;06/19/07  . Arthropathy, unspecified, site unspecified   . Irritable bowel syndrome   . Melanosis coli   . Unspecified essential hypertension   . Other, mixed, or unspecified nondependent drug abuse, unspecified   . Hepatomegaly   . Hiatal hernia   . Fatty liver   . Vitamin B12 deficiency   . Pancreatitis, chronic   . Blind loop syndrome   . Diabetes insipidus   . Diabetes mellitus     Past Surgical History  Procedure Date  . Breast enhancement surgery   . Tonsillectomy   . Wrist surgery     right  . Abdominal hysterectomy     partial  . Cardiac catheterization 07/07/98;08/31/01;03/04/05    60 -70% LAD  .  Femoral artery stent     Left leg    History  Smoking status  . Former Smoker -- 1.0 packs/day for 30 years  . Types: Cigarettes  . Quit date: 10/14/2010  Smokeless tobacco  . Never Used    History  Alcohol Use No    Family History  Problem Relation Age of Onset  . Hypertension Father     aneurysm  . Stroke    . Hypertension Mother   . Colon cancer Paternal Grandfather     Review of Systems: The review of systems is positive for indigestion and burning in her throat.  All other systems were reviewed and are negative.  Physical Exam: BP 108/80  Pulse 81  Ht 5\' 3"  (1.6 m)  Wt 199 lb 12.8 oz (90.629 kg)  BMI 35.39 kg/m2 She is an obese white female in no acute distress. Her HEENT exam is unremarkable. Pupils are equal round and reactive. Sclera clear. Oropharynx is clear. Neck is supple no JVD, adenopathy, thyromegaly or bruits. Lungs are clear. Cardiac exam reveals a regular rate and rhythm without gallop, murmur, or click. Abdomen is soft and nontender. She has no edema. Her pedal pulses are reduced but palpable. She is alert and oriented x3. Cranial nerves II through XII are intact. LABORATORY DATA: ECG demonstrates normal sinus rhythm with frequent PVCs otherwise normal.  Assessment / Plan:

## 2011-05-17 NOTE — Assessment & Plan Note (Signed)
She has modest focal mid LAD disease with normal stress tests in the past. I think that her current symptoms are clearly GI related. I recommended she continue on her current medical therapy. I have congratulated her on her smoking cessation. I will followup again in 6 months.

## 2011-05-26 ENCOUNTER — Other Ambulatory Visit: Payer: Self-pay | Admitting: Gastroenterology

## 2011-07-04 DIAGNOSIS — M25569 Pain in unspecified knee: Secondary | ICD-10-CM | POA: Insufficient documentation

## 2011-07-25 ENCOUNTER — Other Ambulatory Visit (HOSPITAL_COMMUNITY): Payer: Self-pay | Admitting: Internal Medicine

## 2011-07-25 DIAGNOSIS — Z1231 Encounter for screening mammogram for malignant neoplasm of breast: Secondary | ICD-10-CM

## 2011-07-26 LAB — COMPREHENSIVE METABOLIC PANEL
ALT: 29
AST: 26
CO2: 27
Calcium: 9.4
GFR calc Af Amer: >60
GFR calc non Af Amer: >60
Sodium: 136

## 2011-07-26 LAB — CBC
MCHC: 33.6
RBC: 4.64
WBC: 12.2 — ABNORMAL HIGH

## 2011-08-14 ENCOUNTER — Ambulatory Visit (HOSPITAL_COMMUNITY)
Admission: RE | Admit: 2011-08-14 | Discharge: 2011-08-14 | Disposition: A | Discharge status: Home / Self Care | Payer: Medicare Other | Source: Ambulatory Visit | Attending: Internal Medicine | Admitting: Internal Medicine

## 2011-08-14 DIAGNOSIS — Z1231 Encounter for screening mammogram for malignant neoplasm of breast: Secondary | ICD-10-CM | POA: Insufficient documentation

## 2011-08-25 ENCOUNTER — Other Ambulatory Visit: Payer: Self-pay | Admitting: Vascular Surgery

## 2011-09-09 ENCOUNTER — Ambulatory Visit
Admission: RE | Admit: 2011-09-09 | Discharge: 2011-09-09 | Disposition: A | Discharge status: Home / Self Care | Payer: Medicare Other | Source: Ambulatory Visit | Attending: Otolaryngology | Admitting: Otolaryngology

## 2011-09-09 ENCOUNTER — Other Ambulatory Visit: Payer: Self-pay | Admitting: Otolaryngology

## 2011-09-09 DIAGNOSIS — E041 Nontoxic single thyroid nodule: Secondary | ICD-10-CM

## 2011-09-24 DIAGNOSIS — R5383 Other fatigue: Secondary | ICD-10-CM | POA: Insufficient documentation

## 2011-10-24 ENCOUNTER — Other Ambulatory Visit: Payer: Self-pay | Admitting: Vascular Surgery

## 2011-11-27 ENCOUNTER — Other Ambulatory Visit: Payer: Self-pay | Admitting: Vascular Surgery

## 2012-02-27 ENCOUNTER — Other Ambulatory Visit: Payer: Self-pay | Admitting: Vascular Surgery

## 2012-04-03 ENCOUNTER — Other Ambulatory Visit: Payer: Self-pay | Admitting: Cardiology

## 2012-04-03 DIAGNOSIS — I2510 Atherosclerotic heart disease of native coronary artery without angina pectoris: Secondary | ICD-10-CM

## 2012-04-03 MED ORDER — FUROSEMIDE 20 MG PO TABS: 20.0000 mg | ORAL_TABLET | Freq: Every day | ORAL | Status: DC

## 2012-04-07 ENCOUNTER — Other Ambulatory Visit: Payer: Medicare Other

## 2012-04-07 ENCOUNTER — Ambulatory Visit: Payer: Medicare Other | Admitting: Vascular Surgery

## 2012-04-07 ENCOUNTER — Telehealth: Payer: Self-pay | Admitting: Cardiology

## 2012-04-07 NOTE — Telephone Encounter (Signed)
Refill F/u - furosemide (LASIX) 20 MG tablet   [facility] called wondering why they had not received any feedback on this RX.  Please research and contact pharmacy.

## 2012-04-08 ENCOUNTER — Other Ambulatory Visit: Payer: Self-pay | Admitting: *Deleted

## 2012-04-08 DIAGNOSIS — I2510 Atherosclerotic heart disease of native coronary artery without angina pectoris: Secondary | ICD-10-CM

## 2012-04-08 MED ORDER — FUROSEMIDE 20 MG PO TABS: 20.0000 mg | ORAL_TABLET | Freq: Every day | ORAL | Status: DC

## 2012-04-13 ENCOUNTER — Encounter: Payer: Self-pay | Admitting: Neurosurgery

## 2012-04-14 ENCOUNTER — Ambulatory Visit (INDEPENDENT_AMBULATORY_CARE_PROVIDER_SITE_OTHER): Payer: Medicare Other | Admitting: *Deleted

## 2012-04-14 ENCOUNTER — Ambulatory Visit (INDEPENDENT_AMBULATORY_CARE_PROVIDER_SITE_OTHER): Payer: Medicare Other | Admitting: Neurosurgery

## 2012-04-14 ENCOUNTER — Encounter (INDEPENDENT_AMBULATORY_CARE_PROVIDER_SITE_OTHER): Payer: Medicare Other | Admitting: *Deleted

## 2012-04-14 ENCOUNTER — Encounter: Payer: Self-pay | Admitting: Neurosurgery

## 2012-04-14 VITALS — BP 107/76 | HR 77 | Resp 16 | Ht 63.0 in | Wt 188.3 lb

## 2012-04-14 DIAGNOSIS — I6529 Occlusion and stenosis of unspecified carotid artery: Secondary | ICD-10-CM

## 2012-04-14 DIAGNOSIS — I739 Peripheral vascular disease, unspecified: Secondary | ICD-10-CM

## 2012-04-14 DIAGNOSIS — Z48812 Encounter for surgical aftercare following surgery on the circulatory system: Secondary | ICD-10-CM

## 2012-04-14 DIAGNOSIS — I70219 Atherosclerosis of native arteries of extremities with intermittent claudication, unspecified extremity: Secondary | ICD-10-CM

## 2012-04-14 NOTE — Procedures (Unsigned)
LOWER EXTREMITY ARTERIAL DUPLEX  INDICATION:  Left SFA stent.  HISTORY: Diabetes:  Yes Cardiac:  No Hypertension:  Yes Smoking:  Yes Previous Surgery:  Left SFA stent placed 02/20/2010  SINGLE LEVEL ARTERIAL EXAM                         RIGHT                LEFT Brachial: Anterior tibial: Posterior tibial: Peroneal: Ankle/Brachial Index:   0.88                 0.93  LOWER EXTREMITY ARTERIAL DUPLEX EXAM  PREVIOUS ABI:  DATE 03/28/2011:  Right = 0.87  Left = 0.81  DUPLEX:  Technically difficult study due to patient back pain and discomfort while lying down. 1. Patent left superficial femoral artery stent with possible mild     hyperplasia versus stent shadowing.  No focal or significant     stenosis is observed. 2. Please see attached diagram for velocity details.  IMPRESSION: 1. Patent left superficial femoral artery stent with possible mild     hyperplasia which is difficult to distinguish from stent shadowing. 2. All waveforms throughout the left lower extremity are triphasic.       ___________________________________________ Nelda Severe. Kellie Simmering, M.D.  LT/MEDQ  D:  04/14/2012  T:  04/14/2012  Job:  [id number]

## 2012-04-14 NOTE — Progress Notes (Signed)
[facility] Carotid Office Note  CC: Lower extremity arterial duplex and carotid duplex Referring Physician: Kellie Simmering  History of Present Illness: 66 year old female patient of Dr. Kellie Simmering who is status post a left SFA stent May of 2011, she also has unknown left ICA occlusion. The patient states she has had some intermittent dizziness and has a history of migraines which causes visual disturbance and her eyes but she has not had any true CVA, TIA signs or symptoms or amaurosis fugax. The patient denies claudication rest pain in the hip and lower extremity ulcerations.  Past Medical History  Diagnosis Date  . Coronary artery disease   . GERD (gastroesophageal reflux disease)     hiatial hernia  . Dyslipidemia   . Dizziness     chronic  . Depression   . Normal nuclear stress test     nml 01/03/10;06/19/07  . Arthropathy, unspecified, site unspecified   . Irritable bowel syndrome   . Melanosis coli   . Unspecified essential hypertension   . Other, mixed, or unspecified nondependent drug abuse, unspecified   . Hepatomegaly   . Hiatal hernia   . Fatty liver   . Vitamin B12 deficiency   . Pancreatitis, chronic   . Blind loop syndrome   . Diabetes insipidus   . Diabetes mellitus   . Carotid artery disease     documented occlusion of left ICA  . Peripheral vascular disease 02/20/10    s/p stent of left SFA    ROS: [x]  Positive   [ ]  Denies    General: [ ]  Weight loss, [ ]  Fever, [ ]  chills Neurologic: [x ] Dizziness, [ ]  Blackouts, [ ]  Seizure [ ]  Stroke, [ ]  "Mini stroke", [ ]  Slurred speech, [ ]  Temporary blindness; [ ]  weakness in arms or legs, [ ]  Hoarseness Cardiac: [ ]  Chest pain/pressure, [x ] Shortness of breath at rest [x ] Shortness of breath with exertion, [ ]  Atrial fibrillation or irregular heartbeat Vascular: [ ]  Pain in legs with walking, [ ]  Pain in legs at rest, [ ]  Pain in legs at night,  [ ]  Non-healing ulcer, [ ]  Blood clot in vein/DVT,    Pulmonary: [ ]  Home oxygen, [ ]  Productive cough, [ ]  Coughing up blood, [ ]  Asthma,  [x ] Wheezing Musculoskeletal:  [ ]  Arthritis, [ ]  Low back pain, [ ]  Joint pain Hematologic: [ ]  Easy Bruising, [ ]  Anemia; [ ]  Hepatitis Gastrointestinal: [ ]  Blood in stool, [ ]  Gastroesophageal Reflux/heartburn, [ ]  Trouble swallowing Urinary: [ ]  chronic Kidney disease, [ ]  on HD - [ ]  MWF or [ ]  TTHS, [ ]  Burning with urination, [ ]  Difficulty urinating Skin: [ ]  Rashes, [ ]  Wounds Psychological: [ ]  Anxiety, [x ] Depression   Social History History  Substance Use Topics  . Smoking status: Former Smoker -- 1.0 packs/day for 30 years    Types: Cigarettes    Quit date: 10/14/2010  . Smokeless tobacco: Never Used  . Alcohol Use: No    Family History Family History  Problem Relation Age of Onset  . Hypertension Father     aneurysm  . Stroke    . Hypertension Mother   . Colon cancer Paternal Grandfather     Allergies  Allergen Reactions  . Codeine   . Iodinated Diagnostic Agents Rash    Current Outpatient Prescriptions  Medication Sig Dispense Refill  . ABILIFY 5 MG tablet       .  AMBULATORY NON FORMULARY MEDICATION Medication Name: Domperidone 10mg  take one tablet by mouth before meals three times a day  240 capsule  3  . amLODipine (NORVASC) 10 MG tablet Take 10 mg by mouth daily.        Marland Kitchen aspirin 81 MG tablet Take 81 mg by mouth daily.        Marland Kitchen atenolol (TENORMIN) 50 MG tablet Take 50 mg by mouth daily.        Marland Kitchen atorvastatin (LIPITOR) 20 MG tablet Take 20 mg by mouth daily.        . B Complex Vitamins INJ Inject as directed every 30 (thirty) days.        . clopidogrel (PLAVIX) 75 MG tablet TAKE 1 TABLET EVERY DAY  30 tablet  2  . clopidogrel (PLAVIX) 75 MG tablet TAKE 1 TABLET EVERY DAY  30 tablet  2  . desipramine (NOPRAMIN) 10 MG tablet Take 10 mg by mouth at bedtime.        Marland Kitchen esomeprazole (NEXIUM) 40 MG capsule Take 40 mg by mouth 2 (two) times daily.        . furosemide  (LASIX) 20 MG tablet Take 1 tablet (20 mg total) by mouth daily.  90 tablet  0  . gabapentin (NEURONTIN) 300 MG capsule Take 300 mg by mouth 3 (three) times daily.        Marland Kitchen HYDROcodone-acetaminophen (VICODIN) 5-500 MG per tablet Take 1 tablet by mouth as needed.        . Liraglutide (VICTOZA) 18 MG/3ML SOLN Inject 1.8 mg into the skin daily.        Marland Kitchen lisinopril (PRINIVIL,ZESTRIL) 40 MG tablet Take 40 mg by mouth daily.        . methscopolamine (PAMINE FORTE) 5 MG tablet Take 5 mg by mouth at bedtime.        . montelukast (SINGULAIR) 10 MG tablet Take 10 mg by mouth daily.        Marland Kitchen NOVOFINE 32G X 6 MM MISC       . Pancrelipase, Lip-Prot-Amyl, (ZENPEP) 20000 UNITS CPEP Take 2 capsules (40,000 Units total) by mouth 3 (three) times daily.  180 capsule  3  . sucralfate (CARAFATE) 1 GM/10ML suspension Take 5 CC after meals and 10 CC at bedtime  2250 mL  1  . venlafaxine (EFFEXOR-XR) 150 MG 24 hr capsule Take 150 mg by mouth daily.        Marland Kitchen zolpidem (AMBIEN) 10 MG tablet Take 10 mg by mouth at bedtime.         Current Facility-Administered Medications  Medication Dose Route Frequency Provider Last Rate Last Dose  . cyanocobalamin ((VITAMIN B-12)) injection 1,000 mcg  1,000 mcg Intramuscular Q30 days Sable Feil, MD   1,000 mcg at 04/30/11 1518    Physical Examination  Filed Vitals:   04/14/12 1424  BP: 107/76  Pulse: 77  Resp:     Body mass index is 33.36 kg/(m^2).  General:  WDWN in NAD Gait: Normal HEENT: WNL Eyes: Pupils equal Pulmonary: normal non-labored breathing , without Rales, rhonchi,  wheezing Cardiac: RRR, without  Murmurs, rubs or gallops; Abdomen: soft, NT, no masses Skin: no rashes, ulcers noted  Vascular Exam Pulses: 2+ radial pulses bilaterally, 2+ femoral pulses bilaterally, lower show a pulses are palpable, no carotid bruits are heard  Extremities without ischemic changes, no Gangrene , no cellulitis; no open wounds;  Musculoskeletal: no muscle wasting or  atrophy   Neurologic: A&O X 3;  Appropriate Affect ; SENSATION: normal; MOTOR FUNCTION:  moving all extremities equally. Speech is fluent/normal  Non-Invasive Vascular Imaging CAROTID DUPLEX 04/14/2012  Right ICA 40 - 59 % stenosis Left ICA 0ccluded stenosis Lower extremity arterial duplex shows her ABIs today to be 0.88 on the right and triphasic she is 0.93 on the left and triphasic her SFA stent is patent  ASSESSMENT/PLAN: Asymptomatic patient with lower extremity SFA stent and known left ICA occlusion. The patient will followup here in one year for repeat carotid duplex and stent duplex. I did discuss with the patient the possibility of following her in 6 months although she is in a annual surveillance category, the patient has decided to stay with one year. The patient knows the signs and symptoms of CVA and knows to report to the nearest emergency department should that occur.  Beatris Ship ANP   Clinic MD: Early

## 2012-04-20 NOTE — Procedures (Unsigned)
CAROTID DUPLEX EXAM  INDICATION:  Followup right carotid stenosis; left ICA occlusion.  HISTORY: Diabetes:  Yes Cardiac:  No Hypertension:  Yes Smoking:  Yes Previous Surgery:  None CV History: Amaurosis Fugax No, Paresthesias No, Hemiparesis No                                      RIGHT                LEFT Brachial systolic pressure:         129                  126 Brachial Doppler waveforms:         WNL                  WNL Vertebral direction of flow:        Antegrade DUPLEX VELOCITIES (cm/sec) CCA peak systolic                   77 ECA peak systolic                   [id number] ICA peak systolic                   123456 ICA end diastolic                   41 PLAQUE MORPHOLOGY:                  Heterogeneous/calcific PLAQUE AMOUNT:                      Moderate PLAQUE LOCATION:                    CCA/ECA/ICA  IMPRESSION: 1. 40%-59% right internal carotid artery stenosis. 2. Mild disease is observed in the right common carotid artery and     external carotid artery. 3. Right vertebral artery is within normal limits.  ___________________________________________ Nelda Severe Kellie Simmering, M.D.  LT/MEDQ  D:  04/14/2012  T:  04/14/2012  Job:  [id number]

## 2012-05-11 DIAGNOSIS — Z09 Encounter for follow-up examination after completed treatment for conditions other than malignant neoplasm: Secondary | ICD-10-CM | POA: Insufficient documentation

## 2012-05-11 DIAGNOSIS — I739 Peripheral vascular disease, unspecified: Secondary | ICD-10-CM | POA: Insufficient documentation

## 2012-05-11 DIAGNOSIS — J439 Emphysema, unspecified: Secondary | ICD-10-CM | POA: Insufficient documentation

## 2012-05-19 ENCOUNTER — Other Ambulatory Visit: Payer: Self-pay | Admitting: Gastroenterology

## 2012-05-30 ENCOUNTER — Other Ambulatory Visit: Payer: Self-pay | Admitting: Vascular Surgery

## 2012-06-19 ENCOUNTER — Other Ambulatory Visit: Payer: Self-pay

## 2012-06-19 NOTE — Telephone Encounter (Signed)
Called pharmacy and told them patient needs an appointment.

## 2012-08-24 ENCOUNTER — Other Ambulatory Visit (HOSPITAL_COMMUNITY): Payer: Self-pay | Admitting: Internal Medicine

## 2012-08-24 DIAGNOSIS — Z1231 Encounter for screening mammogram for malignant neoplasm of breast: Secondary | ICD-10-CM

## 2012-09-02 DIAGNOSIS — R300 Dysuria: Secondary | ICD-10-CM | POA: Insufficient documentation

## 2012-09-15 ENCOUNTER — Other Ambulatory Visit (HOSPITAL_COMMUNITY): Payer: Self-pay | Admitting: Internal Medicine

## 2012-09-15 ENCOUNTER — Ambulatory Visit (HOSPITAL_COMMUNITY)
Admission: RE | Admit: 2012-09-15 | Discharge: 2012-09-15 | Disposition: A | Discharge status: Home / Self Care | Payer: PRIVATE HEALTH INSURANCE | Source: Ambulatory Visit | Attending: Internal Medicine | Admitting: Internal Medicine

## 2012-09-15 DIAGNOSIS — Z1231 Encounter for screening mammogram for malignant neoplasm of breast: Secondary | ICD-10-CM | POA: Insufficient documentation

## 2012-09-16 ENCOUNTER — Other Ambulatory Visit: Payer: Self-pay | Admitting: Surgery

## 2012-12-06 ENCOUNTER — Other Ambulatory Visit: Payer: Self-pay | Admitting: Thoracic Diseases

## 2012-12-22 DIAGNOSIS — L989 Disorder of the skin and subcutaneous tissue, unspecified: Secondary | ICD-10-CM | POA: Insufficient documentation

## 2013-01-13 ENCOUNTER — Other Ambulatory Visit: Payer: Self-pay | Admitting: Dermatology

## 2013-02-03 ENCOUNTER — Other Ambulatory Visit: Payer: Self-pay | Admitting: Dermatology

## 2013-03-21 ENCOUNTER — Other Ambulatory Visit: Payer: Self-pay | Admitting: Surgery

## 2013-04-20 ENCOUNTER — Ambulatory Visit: Payer: Medicare Other | Admitting: Neurosurgery

## 2013-04-20 ENCOUNTER — Encounter (INDEPENDENT_AMBULATORY_CARE_PROVIDER_SITE_OTHER): Payer: Medicare Other | Admitting: Vascular Surgery

## 2013-04-20 ENCOUNTER — Other Ambulatory Visit (INDEPENDENT_AMBULATORY_CARE_PROVIDER_SITE_OTHER): Payer: Medicare Other | Admitting: Vascular Surgery

## 2013-04-20 DIAGNOSIS — Z48812 Encounter for surgical aftercare following surgery on the circulatory system: Secondary | ICD-10-CM

## 2013-04-20 DIAGNOSIS — I6529 Occlusion and stenosis of unspecified carotid artery: Secondary | ICD-10-CM

## 2013-04-20 DIAGNOSIS — I739 Peripheral vascular disease, unspecified: Secondary | ICD-10-CM

## 2013-04-20 DIAGNOSIS — I70219 Atherosclerosis of native arteries of extremities with intermittent claudication, unspecified extremity: Secondary | ICD-10-CM

## 2013-04-27 ENCOUNTER — Other Ambulatory Visit: Payer: Self-pay | Admitting: *Deleted

## 2013-04-27 DIAGNOSIS — I739 Peripheral vascular disease, unspecified: Secondary | ICD-10-CM

## 2013-04-27 DIAGNOSIS — Z48812 Encounter for surgical aftercare following surgery on the circulatory system: Secondary | ICD-10-CM

## 2013-05-03 ENCOUNTER — Encounter: Payer: Self-pay | Admitting: Vascular Surgery

## 2013-05-13 ENCOUNTER — Other Ambulatory Visit: Payer: Self-pay | Admitting: Internal Medicine

## 2013-05-13 DIAGNOSIS — Z1389 Encounter for screening for other disorder: Secondary | ICD-10-CM | POA: Insufficient documentation

## 2013-05-13 DIAGNOSIS — E041 Nontoxic single thyroid nodule: Secondary | ICD-10-CM

## 2013-05-15 ENCOUNTER — Other Ambulatory Visit: Payer: Self-pay | Admitting: Thoracic Diseases

## 2013-05-24 ENCOUNTER — Ambulatory Visit
Admission: RE | Admit: 2013-05-24 | Discharge: 2013-05-24 | Disposition: A | Discharge status: Home / Self Care | Payer: PRIVATE HEALTH INSURANCE | Source: Ambulatory Visit | Attending: Internal Medicine | Admitting: Internal Medicine

## 2013-05-24 DIAGNOSIS — E041 Nontoxic single thyroid nodule: Secondary | ICD-10-CM

## 2013-05-25 ENCOUNTER — Other Ambulatory Visit: Payer: Self-pay | Admitting: Gastroenterology

## 2013-05-26 ENCOUNTER — Other Ambulatory Visit: Payer: Self-pay | Admitting: Internal Medicine

## 2013-05-26 DIAGNOSIS — E041 Nontoxic single thyroid nodule: Secondary | ICD-10-CM

## 2013-06-02 ENCOUNTER — Other Ambulatory Visit (HOSPITAL_COMMUNITY)
Admission: RE | Admit: 2013-06-02 | Discharge: 2013-06-02 | Disposition: A | Discharge status: Home / Self Care | Payer: PRIVATE HEALTH INSURANCE | Source: Ambulatory Visit | Attending: Internal Medicine | Admitting: Internal Medicine

## 2013-06-02 ENCOUNTER — Ambulatory Visit
Admission: RE | Admit: 2013-06-02 | Discharge: 2013-06-02 | Disposition: A | Discharge status: Home / Self Care | Payer: PRIVATE HEALTH INSURANCE | Source: Ambulatory Visit | Attending: Internal Medicine | Admitting: Internal Medicine

## 2013-06-02 DIAGNOSIS — E041 Nontoxic single thyroid nodule: Secondary | ICD-10-CM

## 2013-08-26 ENCOUNTER — Ambulatory Visit
Admission: RE | Admit: 2013-08-26 | Discharge: 2013-08-26 | Disposition: A | Discharge status: Home / Self Care | Payer: PRIVATE HEALTH INSURANCE | Source: Ambulatory Visit | Attending: Internal Medicine | Admitting: Internal Medicine

## 2013-08-26 ENCOUNTER — Other Ambulatory Visit: Payer: Self-pay | Admitting: Internal Medicine

## 2013-08-26 DIAGNOSIS — M25552 Pain in left hip: Secondary | ICD-10-CM

## 2013-09-07 ENCOUNTER — Other Ambulatory Visit: Payer: Self-pay | Admitting: *Deleted

## 2013-09-07 DIAGNOSIS — I25810 Atherosclerosis of coronary artery bypass graft(s) without angina pectoris: Secondary | ICD-10-CM

## 2013-09-07 MED ORDER — CLOPIDOGREL BISULFATE 75 MG PO TABS: 75.0000 mg | ORAL_TABLET | Freq: Every day | ORAL | Status: DC

## 2013-11-22 ENCOUNTER — Other Ambulatory Visit (HOSPITAL_COMMUNITY): Payer: Self-pay | Admitting: Internal Medicine

## 2013-11-22 DIAGNOSIS — Z1231 Encounter for screening mammogram for malignant neoplasm of breast: Secondary | ICD-10-CM

## 2013-11-25 ENCOUNTER — Ambulatory Visit (HOSPITAL_COMMUNITY): Payer: PRIVATE HEALTH INSURANCE

## 2013-11-26 ENCOUNTER — Other Ambulatory Visit (HOSPITAL_COMMUNITY): Payer: Self-pay | Admitting: Internal Medicine

## 2013-11-26 ENCOUNTER — Ambulatory Visit (HOSPITAL_COMMUNITY)
Admission: RE | Admit: 2013-11-26 | Discharge: 2013-11-26 | Disposition: A | Discharge status: Home / Self Care | Payer: PRIVATE HEALTH INSURANCE | Source: Ambulatory Visit | Attending: Internal Medicine | Admitting: Internal Medicine

## 2013-11-26 DIAGNOSIS — Z1231 Encounter for screening mammogram for malignant neoplasm of breast: Secondary | ICD-10-CM | POA: Insufficient documentation

## 2013-11-30 ENCOUNTER — Other Ambulatory Visit: Payer: Self-pay | Admitting: Vascular Surgery

## 2013-11-30 DIAGNOSIS — I6529 Occlusion and stenosis of unspecified carotid artery: Secondary | ICD-10-CM

## 2013-12-15 ENCOUNTER — Other Ambulatory Visit: Payer: Self-pay | Admitting: Gastroenterology

## 2013-12-16 ENCOUNTER — Other Ambulatory Visit: Payer: Self-pay | Admitting: Gastroenterology

## 2013-12-17 ENCOUNTER — Other Ambulatory Visit: Payer: Self-pay | Admitting: Gastroenterology

## 2013-12-31 DIAGNOSIS — K30 Functional dyspepsia: Secondary | ICD-10-CM | POA: Insufficient documentation

## 2014-01-04 ENCOUNTER — Encounter: Payer: Self-pay | Admitting: Gastroenterology

## 2014-02-17 ENCOUNTER — Ambulatory Visit (INDEPENDENT_AMBULATORY_CARE_PROVIDER_SITE_OTHER): Payer: PRIVATE HEALTH INSURANCE | Admitting: Gastroenterology

## 2014-02-17 ENCOUNTER — Encounter: Payer: Self-pay | Admitting: Gastroenterology

## 2014-02-17 VITALS — BP 148/90 | HR 87 | Ht 63.0 in | Wt 186.0 lb

## 2014-02-17 DIAGNOSIS — R197 Diarrhea, unspecified: Secondary | ICD-10-CM

## 2014-02-17 DIAGNOSIS — R109 Unspecified abdominal pain: Secondary | ICD-10-CM

## 2014-02-17 DIAGNOSIS — K219 Gastro-esophageal reflux disease without esophagitis: Secondary | ICD-10-CM

## 2014-02-17 MED ORDER — PANCRELIPASE (LIP-PROT-AMYL) 20000-68000 UNITS PO CPEP: 2.0000 | ORAL_CAPSULE | Freq: Three times a day (TID) | ORAL | Status: DC

## 2014-02-17 MED ORDER — AMBULATORY NON FORMULARY MEDICATION: Status: DC

## 2014-02-17 NOTE — Patient Instructions (Addendum)
We have sent the following medications to your pharmacy for you to pick up at your convenience: Zenpep  We have sent a prescription of domperidone to [facility].   Follow up in 3 months.  Gastroesophageal Reflux Disease, Adult Gastroesophageal reflux disease (GERD) happens when acid from your stomach flows up into the esophagus. When acid comes in contact with the esophagus, the acid causes soreness (inflammation) in the esophagus. Over time, GERD may create small holes (ulcers) in the lining of the esophagus. CAUSES   Increased body weight. This puts pressure on the stomach, making acid rise from the stomach into the esophagus.  Smoking. This increases acid production in the stomach.  Drinking alcohol. This causes decreased pressure in the lower esophageal sphincter (valve or ring of muscle between the esophagus and stomach), allowing acid from the stomach into the esophagus.  Late evening meals and a full stomach. This increases pressure and acid production in the stomach.  A malformed lower esophageal sphincter. Sometimes, no cause is found. SYMPTOMS   Burning pain in the lower part of the mid-chest behind the breastbone and in the mid-stomach area. This may occur twice a week or more often.  Trouble swallowing.  Sore throat.  Dry cough.  Asthma-like symptoms including chest tightness, shortness of breath, or wheezing. DIAGNOSIS  Your caregiver may be able to diagnose GERD based on your symptoms. In some cases, X-rays and other tests may be done to check for complications or to check the condition of your stomach and esophagus. TREATMENT  Your caregiver may recommend over-the-counter or prescription medicines to help decrease acid production. Ask your caregiver before starting or adding any new medicines.  HOME CARE INSTRUCTIONS   Change the factors that you can control. Ask your caregiver for guidance concerning weight loss, quitting smoking, and alcohol  consumption.  Avoid foods and drinks that make your symptoms worse, such as:  Caffeine or alcoholic drinks.  Chocolate.  Peppermint or mint flavorings.  Garlic and onions.  Spicy foods.  Citrus fruits, such as oranges, lemons, or limes.  Tomato-based foods such as sauce, chili, salsa, and pizza.  Fried and fatty foods.  Avoid lying down for the 3 hours prior to your bedtime or prior to taking a nap.  Eat small, frequent meals instead of large meals.  Wear loose-fitting clothing. Do not wear anything tight around your waist that causes pressure on your stomach.  Raise the head of your bed 6 to 8 inches with wood blocks to help you sleep. Extra pillows will not help.  Only take over-the-counter or prescription medicines for pain, discomfort, or fever as directed by your caregiver.  Do not take aspirin, ibuprofen, or other nonsteroidal anti-inflammatory drugs (NSAIDs). SEEK IMMEDIATE MEDICAL CARE IF:   You have pain in your arms, neck, jaw, teeth, or back.  Your pain increases or changes in intensity or duration.  You develop nausea, vomiting, or sweating (diaphoresis).  You develop shortness of breath, or you faint.  Your vomit is green, yellow, black, or looks like coffee grounds or blood.  Your stool is red, bloody, or black. These symptoms could be signs of other problems, such as heart disease, gastric bleeding, or esophageal bleeding. MAKE SURE YOU:   Understand these instructions.  Will watch your condition.  Will get help right away if you are not doing well or get worse. Document Released: 07/10/2005 Document Revised: 12/23/2011 Document Reviewed: 04/19/2011 [facility] Patient Information 2014 [place], [place].  Thank you for choosing me and  [facility]  CC:Dr Leanna Battles

## 2014-02-17 NOTE — Progress Notes (Signed)
    History of Present Illness: This is a 68 year old female previously followed by Dr. Verl Blalock. She has a very complicated GI history. She last underwent upper endoscopy and colonoscopy in October 2011 showing a small hiatal hernia and severe left colon diverticulosis. She is not taking pancreatic enzymes or domperidone any longer. She has frequent diarrhea and incontinence. She has nocturnal GERD and regurgitation.    Dr. Winnifred Friar last office note from July 2012: Chronic pancreatitis with suspected GI motility disorder, associated GERD, abdominal gas and bloating. She seems to be doing better on domperidone prokinetic therapy, and we will continue this medication with discontinuation of Pamine Fort at bedtime. Recent labs were reviewed and were unremarkable although she has fatty infiltration of her liver all abdominal imaging exams. We renewed all her pancreatic extracts, Nexium, and domperidone. I see no need for further GI evaluation at this time. Therapeutic trials the past also of probiotics have not been useful.   Review of Systems: Pertinent positive and negative review of systems were noted in the above HPI section. All other review of systems were otherwise negative.  Current Medications, Allergies, Past Medical History, Past Surgical History, Family History and Social History were reviewed in [employer] record.  Physical Exam: General: Well developed , well nourished, no acute distress Head: Normocephalic and atraumatic Eyes:  sclerae anicteric, EOMI Ears: Normal auditory acuity Mouth: No deformity or lesions Neck: Supple, no masses or thyromegaly Lungs: Clear throughout to auscultation Heart: Regular rate and rhythm; no murmurs, rubs or bruits Abdomen: Soft, non tender and non distended. No masses, hepatosplenomegaly or hernias noted. Normal Bowel sounds Musculoskeletal: Symmetrical with no gross deformities  Skin: No lesions on visible  extremities Pulses:  Normal pulses noted Extremities: No clubbing, cyanosis, edema or deformities noted Neurological: Alert oriented x 4, grossly nonfocal Cervical Nodes:  No significant cervical adenopathy Inguinal Nodes: No significant inguinal adenopathy Psychological:  Alert and cooperative. Normal mood and affect  Assessment and Recommendations:  1. Chronic pancreatitis with pancreatic insufficiency. Noncompliant with pancreatic enzymes likely leading to diarrhea and occasional incontinence. Resume pancreatic enzymes before every meal and snacks.  2. GERD. Add domperidone 10 mg ac and hs. Strict antireflux measures. Continue Nexium 40 mg po bid. Consider adding H2RA at bedtime.  3. Severe diverticulosis.

## 2014-03-11 ENCOUNTER — Encounter: Payer: Self-pay | Admitting: Gastroenterology

## 2014-05-05 ENCOUNTER — Ambulatory Visit (HOSPITAL_COMMUNITY)
Admission: RE | Admit: 2014-05-05 | Discharge: 2014-05-05 | Disposition: A | Discharge status: Home / Self Care | Payer: PRIVATE HEALTH INSURANCE | Source: Ambulatory Visit | Attending: Vascular Surgery | Admitting: Vascular Surgery

## 2014-05-05 ENCOUNTER — Ambulatory Visit (INDEPENDENT_AMBULATORY_CARE_PROVIDER_SITE_OTHER)
Admission: RE | Admit: 2014-05-05 | Discharge: 2014-05-05 | Disposition: A | Discharge status: Home / Self Care | Payer: PRIVATE HEALTH INSURANCE | Source: Ambulatory Visit | Attending: Vascular Surgery | Admitting: Vascular Surgery

## 2014-05-05 DIAGNOSIS — I6529 Occlusion and stenosis of unspecified carotid artery: Secondary | ICD-10-CM | POA: Insufficient documentation

## 2014-05-05 DIAGNOSIS — Z48812 Encounter for surgical aftercare following surgery on the circulatory system: Secondary | ICD-10-CM

## 2014-05-05 DIAGNOSIS — I739 Peripheral vascular disease, unspecified: Secondary | ICD-10-CM

## 2014-05-24 ENCOUNTER — Encounter: Payer: Self-pay | Admitting: Gastroenterology

## 2014-08-17 ENCOUNTER — Other Ambulatory Visit: Payer: Self-pay | Admitting: Dermatology

## 2014-08-25 ENCOUNTER — Other Ambulatory Visit: Payer: Self-pay | Admitting: *Deleted

## 2014-08-25 DIAGNOSIS — I739 Peripheral vascular disease, unspecified: Secondary | ICD-10-CM

## 2014-08-25 MED ORDER — CLOPIDOGREL BISULFATE 75 MG PO TABS: 75.0000 mg | ORAL_TABLET | Freq: Every day | ORAL | Status: DC

## 2014-10-05 ENCOUNTER — Telehealth: Payer: Self-pay | Admitting: Cardiology

## 2014-10-05 NOTE — Telephone Encounter (Signed)
Received records from [facility] (Dr Leanna Battles) for appointment with Dr Martinique on 11/01/14.  Records given to [facility] (medical records) for Dr Doug Sou schedule on 11/01/13.  lp

## 2014-11-01 ENCOUNTER — Ambulatory Visit (INDEPENDENT_AMBULATORY_CARE_PROVIDER_SITE_OTHER): Payer: Medicare Other | Admitting: Cardiology

## 2014-11-01 ENCOUNTER — Encounter: Payer: Self-pay | Admitting: Cardiology

## 2014-11-01 VITALS — BP 104/68 | HR 73 | Ht 63.0 in | Wt 187.0 lb

## 2014-11-01 DIAGNOSIS — I10 Essential (primary) hypertension: Secondary | ICD-10-CM

## 2014-11-01 DIAGNOSIS — I2510 Atherosclerotic heart disease of native coronary artery without angina pectoris: Secondary | ICD-10-CM

## 2014-11-01 DIAGNOSIS — I779 Disorder of arteries and arterioles, unspecified: Secondary | ICD-10-CM

## 2014-11-01 DIAGNOSIS — K589 Irritable bowel syndrome without diarrhea: Secondary | ICD-10-CM

## 2014-11-01 DIAGNOSIS — I739 Peripheral vascular disease, unspecified: Secondary | ICD-10-CM

## 2014-11-01 NOTE — Progress Notes (Signed)
[name] Date of Birth: 11-24-1945   History of Present Illness: [name] is seen today for evaluation of chest pain at the request of Dr. Philip Aspen. She was last seen in August 2012.  She does have a known history of coronary disease with a 60-70% stenosis in the mid LAD. She had cardiac caths in 1999, 2002, and 2006 that showed stable findings. Stress myoview in March 2011 was normal. Echo in 2007 showed mild LVH with normal EF. Mild AV sclerosis. She has a history of chronic tobacco abuse, HTN, hyperlipidemia, and DM. She has known carotid arterial disease with chronic occlusion of the left ICA and 40-59% RICA stenosis. This is followed yearly by Dr. Kellie Simmering. She has been experiencing symptoms of chest pain. This is described as a tightness in the mid lower sternal region. Sometimes she has more pain. On one occasion it was associated with tingling in her face and mouth. She took a sl Ntg with relief. She states she gets these symptoms once a week. She did fracture her foot several months ago and this has been slow to heal and has limited her activity.  Current Outpatient Prescriptions on File Prior to Visit  Medication Sig Dispense Refill  . ABILIFY 5 MG tablet     . amLODipine (NORVASC) 10 MG tablet Take 10 mg by mouth daily.      Marland Kitchen atenolol (TENORMIN) 50 MG tablet Take 50 mg by mouth 2 (two) times daily.     Marland Kitchen atorvastatin (LIPITOR) 20 MG tablet Take 20 mg by mouth daily.      . clopidogrel (PLAVIX) 75 MG tablet Take 1 tablet (75 mg total) by mouth daily. 30 tablet 11  . esomeprazole (NEXIUM) 40 MG capsule Take 40 mg by mouth 2 (two) times daily.      Marland Kitchen gabapentin (NEURONTIN) 300 MG capsule Take 300 mg by mouth 2 (two) times daily.     Marland Kitchen HYDROcodone-acetaminophen (VICODIN) 5-500 MG per tablet Take 1 tablet by mouth as needed.      Marland Kitchen lisinopril (PRINIVIL,ZESTRIL) 40 MG tablet Take 40 mg by mouth daily.      . methscopolamine (PAMINE FORTE) 5 MG tablet Take 5 mg by mouth at bedtime.      .  montelukast (SINGULAIR) 10 MG tablet Take 10 mg by mouth daily.      . Pancrelipase, Lip-Prot-Amyl, (ZENPEP) 20000 UNITS CPEP Take 2 capsules (40,000 Units total) by mouth 3 (three) times daily. 180 capsule 11  . venlafaxine (EFFEXOR-XR) 150 MG 24 hr capsule Take 150 mg by mouth daily.      . furosemide (LASIX) 20 MG tablet Take 1 tablet (20 mg total) by mouth daily. 90 tablet 0   Current Facility-Administered Medications on File Prior to Visit  Medication Dose Route Frequency Provider Last Rate Last Dose  . cyanocobalamin ((VITAMIN B-12)) injection 1,000 mcg  1,000 mcg Intramuscular Q30 days Sable Feil, MD   1,000 mcg at 04/30/11 1518    Allergies  Allergen Reactions  . Codeine   . Iodinated Diagnostic Agents Rash    Past Medical History  Diagnosis Date  . Coronary artery disease   . GERD (gastroesophageal reflux disease)     hiatial hernia  . Dyslipidemia   . Dizziness     chronic  . Depression   . Normal nuclear stress test     nml 01/03/10;06/19/07  . Arthropathy, unspecified, site unspecified   . Irritable bowel syndrome   . Melanosis coli   .  Unspecified essential hypertension   . Other, mixed, or unspecified nondependent drug abuse, unspecified   . Hepatomegaly   . Hiatal hernia   . Fatty liver   . Vitamin B12 deficiency   . Pancreatitis, chronic   . Blind loop syndrome   . Diabetes insipidus   . Diabetes mellitus   . Carotid artery disease     documented occlusion of left ICA  . Peripheral vascular disease 02/20/10    s/p stent of left SFA  . Diverticulosis   . HTN (hypertension)   . Thyroid nodule   . Metatarsal fracture right    Past Surgical History  Procedure Laterality Date  . Breast enhancement surgery    . Tonsillectomy    . Wrist surgery      right  . Abdominal hysterectomy      partial  . Cardiac catheterization  07/07/98;08/31/01;03/04/05    60 -70% LAD  . Femoral artery stent      Left leg  . Patella realignment Left   . Total  abdominal hysterectomy    . Cataract extraction      History  Smoking status  . Former Smoker -- 1.00 packs/day for 30 years  . Types: Cigarettes  . Quit date: 10/14/2010  Smokeless tobacco  . Never Used    History  Alcohol Use No    Family History  Problem Relation Age of Onset  . Hypertension Father     aneurysm  . Stroke    . Hypertension Mother   . Colon cancer Paternal Grandfather     Review of Systems: The review of systems is positive for indigestion and burning in her throat.  All other systems were reviewed and are negative.  Physical Exam: BP 104/68 mmHg  Pulse 73  Ht 5\' 3"  (1.6 m)  Wt 187 lb (84.823 kg)  BMI 33.13 kg/m2 She is an obese white female in no acute distress. Her HEENT exam is unremarkable. Pupils are equal round and reactive. Sclera clear. Oropharynx is clear. Neck is supple no JVD, adenopathy, thyromegaly or bruits. Lungs reveal scattered wheezes. Cardiac exam reveals a regular rate and rhythm without gallop, murmur, or click. There is some chest tenderness to palpation over the lower sternum. Abdomen is soft and nontender. She has no edema. Her pedal pulses are reduced but palpable. She is alert and oriented x3. Cranial nerves II through XII are intact.  LABORATORY DATA: ECG demonstrates normal sinus rhythm with a  Normal Ecg. I have personally reviewed and interpreted this study.   Assessment / Plan: 1. CAD with chronic moderate disease in the mid LAD. Now with symptoms of chest pain of unclear etiology. I have recommended a Lexiscan myoview study. If normal I would recommend continued medical therapy for her CAD. She has multiple potential causes for her chest pain including COPD, GI, or musculoskeletal.   2. Chronic tobacco abuse, recommend smoking cessation  3. COPD  4. DM type 2  5. HTN controlled.  6. Hyperlipidemia. On statin.

## 2014-11-01 NOTE — Patient Instructions (Signed)
We will schedule you for a nuclear stress test.  Stop smoking

## 2014-11-02 ENCOUNTER — Encounter: Payer: Self-pay | Admitting: Gastroenterology

## 2014-11-02 ENCOUNTER — Ambulatory Visit (INDEPENDENT_AMBULATORY_CARE_PROVIDER_SITE_OTHER): Payer: Medicare Other | Admitting: Gastroenterology

## 2014-11-02 VITALS — BP 124/68 | HR 80 | Ht 61.0 in | Wt 187.5 lb

## 2014-11-02 DIAGNOSIS — K8681 Exocrine pancreatic insufficiency: Secondary | ICD-10-CM

## 2014-11-02 DIAGNOSIS — R1013 Epigastric pain: Secondary | ICD-10-CM

## 2014-11-02 DIAGNOSIS — R140 Abdominal distension (gaseous): Secondary | ICD-10-CM

## 2014-11-02 DIAGNOSIS — K219 Gastro-esophageal reflux disease without esophagitis: Secondary | ICD-10-CM

## 2014-11-02 DIAGNOSIS — K868 Other specified diseases of pancreas: Secondary | ICD-10-CM

## 2014-11-02 NOTE — Progress Notes (Signed)
History of Present Illness: This is a 69 year old female who relates intermittent epigastric and right upper quadrant postprandial discomfort and bloating. Symptoms subsided after 20-60 minutes. She has not taking domperidone as previously recommended as she states this medication is too expensive. She was concerned that her symptoms might be related to her liver. Her reflux symptoms are under very good control on Nexium 40 mg twice daily. Office records and blood work from Dr. Beverly Sessions office were reviewed.  Allergies  Allergen Reactions  . Codeine   . Iodinated Diagnostic Agents Rash   Outpatient Prescriptions Prior to Visit  Medication Sig Dispense Refill  . ABILIFY 5 MG tablet     . amLODipine (NORVASC) 10 MG tablet Take 10 mg by mouth daily.      Marland Kitchen atenolol (TENORMIN) 50 MG tablet Take 50 mg by mouth 2 (two) times daily.     Marland Kitchen atorvastatin (LIPITOR) 20 MG tablet Take 20 mg by mouth daily.      . clopidogrel (PLAVIX) 75 MG tablet Take 1 tablet (75 mg total) by mouth daily. 30 tablet 11  . Cyanocobalamin (VITAMIN B-12 PO) Take by mouth daily.    Marland Kitchen esomeprazole (NEXIUM) 40 MG capsule Take 40 mg by mouth 2 (two) times daily.      . furosemide (LASIX) 20 MG tablet Take 20 mg by mouth daily.  3  . gabapentin (NEURONTIN) 300 MG capsule Take 300 mg by mouth 2 (two) times daily.     Marland Kitchen HYDROcodone-acetaminophen (VICODIN) 5-500 MG per tablet Take 1 tablet by mouth as needed.      Marland Kitchen lisinopril (PRINIVIL,ZESTRIL) 40 MG tablet Take 40 mg by mouth daily.      . methscopolamine (PAMINE FORTE) 5 MG tablet Take 5 mg by mouth at bedtime.      . montelukast (SINGULAIR) 10 MG tablet Take 10 mg by mouth daily.      Marland Kitchen MYRBETRIQ 50 MG TB24 tablet Take 50 mg by mouth daily.  11  . Pancrelipase, Lip-Prot-Amyl, (ZENPEP) 20000 UNITS CPEP Take 2 capsules (40,000 Units total) by mouth 3 (three) times daily. 180 capsule 11  . venlafaxine (EFFEXOR-XR) 150 MG 24 hr capsule Take 150 mg by mouth daily.       . furosemide (LASIX) 20 MG tablet Take 1 tablet (20 mg total) by mouth daily. 90 tablet 0   Facility-Administered Medications Prior to Visit  Medication Dose Route Frequency Provider Last Rate Last Dose  . cyanocobalamin ((VITAMIN B-12)) injection 1,000 mcg  1,000 mcg Intramuscular Q30 days Sable Feil, MD   1,000 mcg at 04/30/11 1518   Past Medical History  Diagnosis Date  . Coronary artery disease   . GERD (gastroesophageal reflux disease)     hiatial hernia  . Dyslipidemia   . Dizziness     chronic  . Depression   . Normal nuclear stress test     nml 01/03/10;06/19/07  . Arthropathy, unspecified, site unspecified   . Irritable bowel syndrome   . Melanosis coli   . Unspecified essential hypertension   . Other, mixed, or unspecified nondependent drug abuse, unspecified   . Hepatomegaly   . Hiatal hernia   . Fatty liver   . Vitamin B12 deficiency   . Pancreatitis, chronic   . Blind loop syndrome   . Diabetes insipidus   . Diabetes mellitus   . Carotid artery disease     documented occlusion of left ICA  . Peripheral vascular disease 02/20/10  s/p stent of left SFA  . Diverticulosis   . HTN (hypertension)   . Thyroid nodule   . Metatarsal fracture right   Past Surgical History  Procedure Laterality Date  . Breast enhancement surgery    . Tonsillectomy    . Wrist surgery      right  . Abdominal hysterectomy      partial  . Cardiac catheterization  07/07/98;08/31/01;03/04/05    60 -70% LAD  . Femoral artery stent      Left leg  . Patella realignment Left   . Total abdominal hysterectomy    . Cataract extraction     History   Social History  . Marital Status: Divorced    Spouse Name: N/A    Number of Children: 2  . Years of Education: N/A   Occupational History  . retired    Social History Main Topics  . Smoking status: Former Smoker -- 1.00 packs/day for 30 years    Types: Cigarettes    Quit date: 10/14/2010  . Smokeless tobacco: Never Used  .  Alcohol Use: No  . Drug Use: No  . Sexual Activity: None   Other Topics Concern  . None   Social History Narrative   Family History  Problem Relation Age of Onset  . Hypertension Father     aneurysm  . Stroke    . Hypertension Mother   . Colon cancer Paternal Grandfather      Physical Exam: General: Well developed , well nourished, no acute distress Head: Normocephalic and atraumatic Eyes:  sclerae anicteric, EOMI Ears: Normal auditory acuity Mouth: No deformity or lesions Lungs: Clear throughout to auscultation Heart: Regular rate and rhythm; no murmurs, rubs or bruits Abdomen: Soft, non tender and non distended. No masses, hepatosplenomegaly or hernias noted. Normal Bowel sounds Musculoskeletal: Symmetrical with no gross deformities  Pulses:  Normal pulses noted Extremities: No clubbing, cyanosis, edema or deformities noted Neurological: Alert oriented x 4, grossly nonfocal Psychological:  Alert and cooperative. Normal mood and affect  Assessment and Recommendations:  1. Postprandial epigastric and right upper quadrant discomfort and bloating. She was reassured that this is likely not related to her liver. Although prior gastric emptying scan did not show delayed emptying she did have some symptomatic improvement in the past taking domperidone. She states domperidone is too expensive and therefore we will treat symptomatically with a gastroparesis diet. If her symptoms do not respond will try an anti-spasmodic before meals and schedule an abd [place].  2. GERD. Continue Nexium 40 mg twice daily and standard antireflux measures.  3. Fatty liver. Optimal management of diabetes per PCP. Long-term weight loss program with a low fat diet supervised by her PCP.  4. Chronic pancreatitis and pancreatic insufficiency. Continue pancreatic enzyme replacement.

## 2014-11-02 NOTE — Patient Instructions (Signed)
You have been given a Gastroparesis diet to follow.  cc: Leanna Battles, MD

## 2014-11-08 ENCOUNTER — Telehealth (HOSPITAL_COMMUNITY): Payer: Self-pay

## 2014-11-08 NOTE — Telephone Encounter (Signed)
Encounter complete. 

## 2014-11-10 ENCOUNTER — Ambulatory Visit (HOSPITAL_COMMUNITY)
Admission: RE | Admit: 2014-11-10 | Discharge: 2014-11-10 | Disposition: A | Discharge status: Home / Self Care | Payer: Medicare Other | Source: Ambulatory Visit | Attending: Cardiovascular Disease | Admitting: Cardiovascular Disease

## 2014-11-10 DIAGNOSIS — R079 Chest pain, unspecified: Secondary | ICD-10-CM | POA: Insufficient documentation

## 2014-11-10 DIAGNOSIS — R0609 Other forms of dyspnea: Secondary | ICD-10-CM | POA: Diagnosis not present

## 2014-11-10 DIAGNOSIS — I779 Disorder of arteries and arterioles, unspecified: Secondary | ICD-10-CM

## 2014-11-10 DIAGNOSIS — R61 Generalized hyperhidrosis: Secondary | ICD-10-CM | POA: Insufficient documentation

## 2014-11-10 DIAGNOSIS — I10 Essential (primary) hypertension: Secondary | ICD-10-CM

## 2014-11-10 DIAGNOSIS — R5383 Other fatigue: Secondary | ICD-10-CM | POA: Diagnosis not present

## 2014-11-10 DIAGNOSIS — I2510 Atherosclerotic heart disease of native coronary artery without angina pectoris: Secondary | ICD-10-CM

## 2014-11-10 DIAGNOSIS — R110 Nausea: Secondary | ICD-10-CM | POA: Diagnosis not present

## 2014-11-10 DIAGNOSIS — R42 Dizziness and giddiness: Secondary | ICD-10-CM | POA: Insufficient documentation

## 2014-11-10 DIAGNOSIS — R55 Syncope and collapse: Secondary | ICD-10-CM | POA: Diagnosis not present

## 2014-11-10 DIAGNOSIS — I739 Peripheral vascular disease, unspecified: Secondary | ICD-10-CM

## 2014-11-10 DIAGNOSIS — K589 Irritable bowel syndrome without diarrhea: Secondary | ICD-10-CM

## 2014-11-10 MED ORDER — TECHNETIUM TC 99M SESTAMIBI GENERIC - CARDIOLITE
10.7000 | Freq: Once | INTRAVENOUS | Status: AC | PRN
  Administered 2014-11-10: 11 via INTRAVENOUS

## 2014-11-10 MED ORDER — REGADENOSON 0.4 MG/5ML IV SOLN
0.4000 mg | Freq: Once | INTRAVENOUS | Status: AC
  Administered 2014-11-10: 0.4 mg via INTRAVENOUS

## 2014-11-10 MED ORDER — TECHNETIUM TC 99M SESTAMIBI GENERIC - CARDIOLITE
30.9000 | Freq: Once | INTRAVENOUS | Status: AC | PRN
  Administered 2014-11-10: 30.9 via INTRAVENOUS

## 2014-11-10 MED ORDER — AMINOPHYLLINE 25 MG/ML IV SOLN
75.0000 mg | Freq: Once | INTRAVENOUS | Status: AC
  Administered 2014-11-10: 75 mg via INTRAVENOUS

## 2014-11-10 NOTE — Procedures (Addendum)
[facility] [address] [place] [id number]  Cardiology Nuclear Med Study  [name] is a 69 y.o. female     MRN : [id number]     DOB: [birth year]  Procedure Date: 11/10/2014  Nuclear Med Background Indication for Stress Test:  Follow up CAD History:  COPD and CAD;HX of childhood seizure;PVD;Last NUC MPI on 01/03/2010-normal;EF=81% Cardiac Risk Factors: Carotid Disease, Family History - CAD, Hypertension, Lipids, NIDDM, Obesity, PVD and Smoker  Symptoms:  Chest Pain, Diaphoresis, Dizziness, DOE, Fatigue, Light-Headedness, Nausea and Near Syncope   Nuclear Pre-Procedure Caffeine/Decaff Intake:  1:00am NPO After: 11am   IV Site: R Forearm  IV 0.9% NS with Angio Cath:  22g  Chest Size (in):  n/a IV Started by: Rolene Course, RN  Height: 5\' 3"  (1.6 m)  Cup Size: DD;Pt is s/p Bilateral breast implants.  BMI:  Body mass index is 33.13 kg/(m^2). Weight:  187 lb (84.823 kg)   Tech Comments:  n/a    Nuclear Med Study 1 or 2 day study: 1 day  Stress Test Type:  Stress  Order Authorizing Provider:  Peter Martinique, MD   Resting Radionuclide: Technetium 55m Sestamibi  Resting Radionuclide Dose: 10.7 mCi   Stress Radionuclide:  Technetium 101m Sestamibi  Stress Radionuclide Dose: 30.9 mCi           Stress Protocol Rest HR: 81 Stress HR: 103  Rest BP: 160/82 Stress BP: 160/82  Exercise Time (min): n/a METS: n/a          Dose of Adenosine (mg):  n/a Dose of Lexiscan: 0.4 mg  Dose of Atropine (mg): n/a Dose of Dobutamine: n/a mcg/kg/min (at max HR)  Stress Test Technologist: Mellody Memos, CCT Nuclear Technologist: Imagene Riches, CNMT   Rest Procedure:  Myocardial perfusion imaging was performed at rest 45 minutes following the intravenous administration of Technetium 35m Sestamibi. Stress Procedure:  The patient received IV Lexiscan 0.4 mg over 15-seconds.  Technetium 63m Sestamibi injected IV at 30-seconds.   Patient experienced shortness of breath, stomach pains, head pains and pressure. She was administered 75 mg of Aminophylline IV at 5 minutes. There were no significant changes with Lexiscan.  Quantitative spect images were obtained after a 45 minute delay.  Transient Ischemic Dilatation (Normal <1.22):  1.33  QGS EDV:  N/A QGS ESV:  N/A LV Ejection Fraction: Study not gated    Rest ECG: NSR - Normal EKG  Stress ECG: No significant ST segment change suggestive of ischemia.  QPS Raw Data Images:  Acquisition technically good; normal left ventricular size. Stress Images:  There is decreased uptake in the distal anterior wall. Rest Images:  There is decreased uptake in the distal anterior wall, slightly less prominent compared to the stress images Subtraction (SDS):  These findings are consistent with probable soft tissue attenuation; cannot R/O very mild ischemia in the distal anterior wall.  Impression Exercise Capacity:  Lexiscan with no exercise. BP Response:  Normal blood pressure response. Clinical Symptoms:  There is dyspnea. ECG Impression:  No significant ST segment change suggestive of ischemia. Comparison with Prior Nuclear Study: No previous nuclear study performed  Overall Impression:  Low risk stress nuclear study with a small, mild, partially reversible defect in the distal anterior wall most likely related to soft tissue attenuation; cannot R/O very mild ischemia.  LV Wall Motion:  Study not gated due to ectopy.   Kirk Ruths, MD  11/11/2014 7:21 AM

## 2014-11-16 ENCOUNTER — Telehealth: Payer: Self-pay | Admitting: Cardiology

## 2014-11-16 NOTE — Telephone Encounter (Signed)
Returned call to patient myoview results given. 

## 2014-11-16 NOTE — Telephone Encounter (Signed)
Pt would like stress test results from last Thursday please.

## 2015-01-04 ENCOUNTER — Other Ambulatory Visit (HOSPITAL_COMMUNITY): Payer: Self-pay | Admitting: Internal Medicine

## 2015-01-04 DIAGNOSIS — Z1231 Encounter for screening mammogram for malignant neoplasm of breast: Secondary | ICD-10-CM

## 2015-01-09 ENCOUNTER — Ambulatory Visit (HOSPITAL_COMMUNITY): Payer: Medicare Other

## 2015-01-18 ENCOUNTER — Ambulatory Visit (HOSPITAL_COMMUNITY)
Admission: RE | Admit: 2015-01-18 | Discharge: 2015-01-18 | Disposition: A | Discharge status: Home / Self Care | Payer: Medicare Other | Source: Ambulatory Visit | Attending: Internal Medicine | Admitting: Internal Medicine

## 2015-01-18 DIAGNOSIS — Z1231 Encounter for screening mammogram for malignant neoplasm of breast: Secondary | ICD-10-CM | POA: Diagnosis not present

## 2015-03-01 ENCOUNTER — Other Ambulatory Visit: Payer: Self-pay | Admitting: Gastroenterology

## 2015-07-28 DIAGNOSIS — W1849XA Other slipping, tripping and stumbling without falling, initial encounter: Secondary | ICD-10-CM | POA: Insufficient documentation

## 2015-08-15 HISTORY — PX: EYE SURGERY: SHX253

## 2015-09-04 ENCOUNTER — Other Ambulatory Visit: Payer: Self-pay

## 2015-09-04 DIAGNOSIS — I739 Peripheral vascular disease, unspecified: Secondary | ICD-10-CM

## 2015-09-04 DIAGNOSIS — Z4889 Encounter for other specified surgical aftercare: Secondary | ICD-10-CM

## 2015-09-04 MED ORDER — CLOPIDOGREL BISULFATE 75 MG PO TABS: 75.0000 mg | ORAL_TABLET | Freq: Every day | ORAL | Status: DC

## 2015-10-26 ENCOUNTER — Telehealth: Payer: Self-pay

## 2015-10-26 NOTE — Telephone Encounter (Signed)
Received a call from patient.She stated last week she had 2 episodes of jaw pain relieved by NTG x 1.Stated last night she had tightness in neck.Stated she feels ok this morning, but would like to see Dr.Jordan sooner.Appointmen scheduled with Kerin Ransom 10/27/15 at 8:30 am.Advised to go to ER if needed.

## 2015-10-27 ENCOUNTER — Encounter: Payer: Self-pay | Admitting: Cardiology

## 2015-10-27 ENCOUNTER — Ambulatory Visit (INDEPENDENT_AMBULATORY_CARE_PROVIDER_SITE_OTHER): Payer: Medicare Other | Admitting: Cardiology

## 2015-10-27 VITALS — BP 128/70 | HR 84 | Ht 63.0 in | Wt 187.6 lb

## 2015-10-27 DIAGNOSIS — R079 Chest pain, unspecified: Secondary | ICD-10-CM | POA: Diagnosis not present

## 2015-10-27 DIAGNOSIS — I10 Essential (primary) hypertension: Secondary | ICD-10-CM

## 2015-10-27 DIAGNOSIS — E785 Hyperlipidemia, unspecified: Secondary | ICD-10-CM

## 2015-10-27 DIAGNOSIS — I2510 Atherosclerotic heart disease of native coronary artery without angina pectoris: Secondary | ICD-10-CM | POA: Diagnosis not present

## 2015-10-27 DIAGNOSIS — I2583 Coronary atherosclerosis due to lipid rich plaque: Secondary | ICD-10-CM

## 2015-10-27 MED ORDER — ISOSORBIDE MONONITRATE ER 30 MG PO TB24: 30.0000 mg | ORAL_TABLET | Freq: Every day | ORAL | Status: DC

## 2015-10-27 MED ORDER — ISOSORBIDE MONONITRATE ER 30 MG PO TB24: 15.0000 mg | ORAL_TABLET | Freq: Every day | ORAL | Status: DC

## 2015-10-27 NOTE — Assessment & Plan Note (Signed)
Pt seen today with complaints of jaw and chest pain

## 2015-10-27 NOTE — Assessment & Plan Note (Signed)
Controlled.  

## 2015-10-27 NOTE — Patient Instructions (Addendum)
Medication Instructions:  START Isosorbide Mononitrate (Imdur) 30 mg - take 0.5 tablet (15 mg total) by mouth daily  Labwork: NONE  Testing/Procedures: 1. [place] Stress Test - Your physician has requested that you have a lexiscan myoview. For further information please visit [email or web address]. Please follow instruction sheet, as given.  Follow-Up: Kerin Ransom, [place], recommends that you schedule a follow-up appointment after your stress test with Dr Martinique. You will receive a reminder letter in the mail two months in advance. If you don't receive a letter, please call our office to schedule the follow-up appointment.  If you need a refill on your cardiac medications before your next appointment, please call your pharmacy.

## 2015-10-27 NOTE — Progress Notes (Signed)
10/27/2015 ARINA CHECCHI   1946-02-13  [id number]  Primary Physician Donnajean Lopes, MD Primary Cardiologist: Dr Martinique  HPI:  70 y/o obese, diabetic female with a history of CAD, last cath in 2012 showing moderate LAD stenosis. She had a Myoview Jan 2016 that was low risk. She apparently has had multiple prior caths showing stable CAD. She is seen in the office today with complaints of jaw pain that radiated to her chest. This came on at rest while watching TV. She denies associated nausea, vomiting, or diaphoresis. She says she took one NTG with relief.    Current Outpatient Prescriptions  Medication Sig Dispense Refill  . ABILIFY 5 MG tablet Take 5 mg by mouth daily.     Marland Kitchen amLODipine (NORVASC) 10 MG tablet Take 10 mg by mouth daily.      Marland Kitchen atenolol (TENORMIN) 50 MG tablet Take 50 mg by mouth 2 (two) times daily.     Marland Kitchen atorvastatin (LIPITOR) 20 MG tablet Take 20 mg by mouth daily.      . clopidogrel (PLAVIX) 75 MG tablet Take 1 tablet (75 mg total) by mouth daily. 30 tablet 3  . Cyanocobalamin (VITAMIN B-12 PO) Take by mouth daily.    Marland Kitchen esomeprazole (NEXIUM) 40 MG capsule Take 40 mg by mouth 2 (two) times daily.      . furosemide (LASIX) 20 MG tablet Take 20 mg by mouth daily.  3  . gabapentin (NEURONTIN) 300 MG capsule Take 300 mg by mouth 2 (two) times daily.     Marland Kitchen HYDROcodone-acetaminophen (VICODIN) 5-500 MG per tablet Take 1 tablet by mouth as needed.      Marland Kitchen lisinopril (PRINIVIL,ZESTRIL) 40 MG tablet Take 40 mg by mouth daily.      . methscopolamine (PAMINE FORTE) 5 MG tablet Take 5 mg by mouth at bedtime.      . montelukast (SINGULAIR) 10 MG tablet Take 10 mg by mouth daily.      Marland Kitchen MYRBETRIQ 50 MG TB24 tablet Take 50 mg by mouth daily.  11  . venlafaxine (EFFEXOR-XR) 150 MG 24 hr capsule Take 150 mg by mouth daily.      Marland Kitchen VICTOZA 18 MG/3ML SOPN Inject 1.8 mg into the skin daily.  4  . ZENPEP 20000 UNITS CPEP TAKE 2 CAPSULES (40,000 UNITS TOTAL) BY MOUTH 3 (THREE) TIMES  DAILY. 180 capsule 5  . isosorbide mononitrate (IMDUR) 30 MG 24 hr tablet Take 0.5 tablets (15 mg total) by mouth daily. 15 tablet 11   No current facility-administered medications for this visit.    Allergies  Allergen Reactions  . Codeine   . Iodinated Diagnostic Agents Rash    Social History   Social History  . Marital Status: Divorced    Spouse Name: N/A  . Number of Children: 2  . Years of Education: N/A   Occupational History  . retired    Social History Main Topics  . Smoking status: Former Smoker -- 1.00 packs/day for 30 years    Types: Cigarettes    Quit date: 10/14/2010  . Smokeless tobacco: Never Used  . Alcohol Use: No  . Drug Use: No  . Sexual Activity: Not on file   Other Topics Concern  . Not on file   Social History Narrative     Review of Systems: General: negative for chills, fever, night sweats or weight changes.  Cardiovascular: negative for chest pain, dyspnea on exertion, edema, orthopnea, palpitations, paroxysmal nocturnal dyspnea or shortness of breath  Dermatological: negative for rash Respiratory: negative for cough or wheezing Urologic: negative for hematuria Abdominal: negative for nausea, vomiting, diarrhea, bright red blood per rectum, melena, or hematemesis Neurologic: negative for visual changes, syncope, or dizziness All other systems reviewed and are otherwise negative except as noted above.    Blood pressure 128/70, pulse 84, height 5\' 3"  (1.6 m), weight 187 lb 9.6 oz (85.095 kg).  General appearance: alert, cooperative, no distress and moderately obese Lungs: clear to auscultation bilaterally Heart: regular rate and rhythm Neurologic: Grossly normal  EKG NSR, no acute changes  ASSESSMENT AND PLAN:   Chest pain with moderate risk of acute coronary syndrome Pt seen today with complaints of jaw and chest pain  Coronary artery disease Known 60-70% LAD, Multiple caths showing stable CAD. Last Myoview Jan 2016 was low  risk  Essential hypertension Controlled  Dyslipidemia On statin Rx  Type 2 diabetes mellitus ([place]) .   PLAN  Discussed with Dr Martinique. He is not inclined to cath her again unless she as some objective evidence of progression of CAD. I ordered a Lexiscan Myoview and added low dose Imdur.   Ariz Terrones K PA-C 10/27/2015 12:12 PM

## 2015-10-27 NOTE — Assessment & Plan Note (Addendum)
Known 60-70% LAD, Multiple caths showing stable CAD. Last Myoview Jan 2016 was low risk

## 2015-10-27 NOTE — Assessment & Plan Note (Signed)
On statin Rx 

## 2015-11-01 ENCOUNTER — Telehealth (HOSPITAL_COMMUNITY): Payer: Self-pay

## 2015-11-01 NOTE — Telephone Encounter (Signed)
Encounter complete. 

## 2015-11-03 ENCOUNTER — Ambulatory Visit (HOSPITAL_COMMUNITY)
Admission: RE | Admit: 2015-11-03 | Discharge: 2015-11-03 | Disposition: A | Discharge status: Home / Self Care | Payer: Medicare Other | Source: Ambulatory Visit | Attending: Cardiovascular Disease | Admitting: Cardiovascular Disease

## 2015-11-03 DIAGNOSIS — R0602 Shortness of breath: Secondary | ICD-10-CM | POA: Insufficient documentation

## 2015-11-03 DIAGNOSIS — Z8249 Family history of ischemic heart disease and other diseases of the circulatory system: Secondary | ICD-10-CM | POA: Diagnosis not present

## 2015-11-03 DIAGNOSIS — E669 Obesity, unspecified: Secondary | ICD-10-CM | POA: Diagnosis not present

## 2015-11-03 DIAGNOSIS — I10 Essential (primary) hypertension: Secondary | ICD-10-CM | POA: Insufficient documentation

## 2015-11-03 DIAGNOSIS — I739 Peripheral vascular disease, unspecified: Secondary | ICD-10-CM | POA: Insufficient documentation

## 2015-11-03 DIAGNOSIS — R5383 Other fatigue: Secondary | ICD-10-CM | POA: Insufficient documentation

## 2015-11-03 DIAGNOSIS — Z6833 Body mass index (BMI) 33.0-33.9, adult: Secondary | ICD-10-CM | POA: Diagnosis not present

## 2015-11-03 DIAGNOSIS — E119 Type 2 diabetes mellitus without complications: Secondary | ICD-10-CM | POA: Insufficient documentation

## 2015-11-03 DIAGNOSIS — R002 Palpitations: Secondary | ICD-10-CM | POA: Insufficient documentation

## 2015-11-03 DIAGNOSIS — R42 Dizziness and giddiness: Secondary | ICD-10-CM | POA: Diagnosis not present

## 2015-11-03 DIAGNOSIS — I779 Disorder of arteries and arterioles, unspecified: Secondary | ICD-10-CM | POA: Diagnosis not present

## 2015-11-03 DIAGNOSIS — R079 Chest pain, unspecified: Secondary | ICD-10-CM | POA: Insufficient documentation

## 2015-11-03 LAB — MYOCARDIAL PERFUSION IMAGING
Peak HR: 93 {beats}/min
Rest HR: 69 {beats}/min
SDS: 1
SRS: 4
SSS: 5
TID: 1.58

## 2015-11-03 MED ORDER — TECHNETIUM TC 99M SESTAMIBI GENERIC - CARDIOLITE
10.2000 | Freq: Once | INTRAVENOUS | Status: AC | PRN
  Administered 2015-11-03: 10.2 via INTRAVENOUS

## 2015-11-03 MED ORDER — TECHNETIUM TC 99M SESTAMIBI GENERIC - CARDIOLITE
31.4000 | Freq: Once | INTRAVENOUS | Status: AC | PRN
  Administered 2015-11-03: 31.4 via INTRAVENOUS

## 2015-11-03 MED ORDER — REGADENOSON 0.4 MG/5ML IV SOLN
0.4000 mg | Freq: Once | INTRAVENOUS | Status: AC
  Administered 2015-11-03: 0.4 mg via INTRAVENOUS

## 2015-11-03 MED ORDER — AMINOPHYLLINE 25 MG/ML IV SOLN
75.0000 mg | Freq: Once | INTRAVENOUS | Status: AC
  Administered 2015-11-03: 75 mg via INTRAVENOUS

## 2015-11-08 ENCOUNTER — Ambulatory Visit (INDEPENDENT_AMBULATORY_CARE_PROVIDER_SITE_OTHER): Payer: Medicare Other | Admitting: Cardiology

## 2015-11-08 ENCOUNTER — Telehealth: Payer: Self-pay | Admitting: *Deleted

## 2015-11-08 ENCOUNTER — Encounter: Payer: Self-pay | Admitting: Cardiology

## 2015-11-08 VITALS — BP 120/76 | HR 72 | Ht 63.0 in | Wt 189.0 lb

## 2015-11-08 DIAGNOSIS — I25118 Atherosclerotic heart disease of native coronary artery with other forms of angina pectoris: Secondary | ICD-10-CM

## 2015-11-08 DIAGNOSIS — R079 Chest pain, unspecified: Secondary | ICD-10-CM

## 2015-11-08 DIAGNOSIS — E1151 Type 2 diabetes mellitus with diabetic peripheral angiopathy without gangrene: Secondary | ICD-10-CM

## 2015-11-08 DIAGNOSIS — I10 Essential (primary) hypertension: Secondary | ICD-10-CM | POA: Diagnosis not present

## 2015-11-08 MED ORDER — NITROGLYCERIN 0.4 MG SL SUBL: 0.4000 mg | SUBLINGUAL_TABLET | SUBLINGUAL | Status: DC | PRN

## 2015-11-08 NOTE — Progress Notes (Signed)
[name] Date of Birth: 22-Feb-1946   History of Present Illness: [name] is seen today for evaluation of chest pain.  She does have a known history of coronary disease with a 60-70% stenosis in the mid LAD. She had cardiac caths in 1999, 2002,  2006, and 2012 that showed stable findings. Stress myoview in March 2011 was normal. Echo in 2007 showed mild LVH with normal EF. Mild AV sclerosis. She has a history of chronic tobacco abuse, HTN, hyperlipidemia, and DM. She has known carotid arterial disease with chronic occlusion of the left ICA and 40-59% RICA stenosis. This is followed yearly by Dr. Kellie Strong. She has GERD and chrdonic pancreatitis.  She was seen recently by [name] for evaluation of chest pain. She reports an episode of severe jaw pain last month radiating into her neck, back, and chest. This improved with sl Ntg. She was placed on Imdur and Lexiscan myoview ordered. No recurrent chest pain or jaw pain. She generally feels better on Imdur. She does stay nauseated a lot and has bad indigestion. She reports her sugars are good.   Current Outpatient Prescriptions on File Prior to Visit  Medication Sig Dispense Refill  . ABILIFY 5 MG tablet Take 5 mg by mouth daily.     Marland Kitchen amLODipine (NORVASC) 10 MG tablet Take 10 mg by mouth daily.      Marland Kitchen atenolol (TENORMIN) 50 MG tablet Take 50 mg by mouth 2 (two) times daily.     Marland Kitchen atorvastatin (LIPITOR) 20 MG tablet Take 20 mg by mouth daily.      . clopidogrel (PLAVIX) 75 MG tablet Take 1 tablet (75 mg total) by mouth daily. 30 tablet 3  . Cyanocobalamin (VITAMIN B-12 PO) Take by mouth daily.    Marland Kitchen esomeprazole (NEXIUM) 40 MG capsule Take 40 mg by mouth 2 (two) times daily.      . furosemide (LASIX) 20 MG tablet Take 20 mg by mouth daily.  3  . gabapentin (NEURONTIN) 300 MG capsule Take 300 mg by mouth 2 (two) times daily.     Marland Kitchen HYDROcodone-acetaminophen (VICODIN) 5-500 MG per tablet Take 1 tablet by mouth as needed.      . isosorbide  mononitrate (IMDUR) 30 MG 24 hr tablet Take 0.5 tablets (15 mg total) by mouth daily. 15 tablet 11  . lisinopril (PRINIVIL,ZESTRIL) 40 MG tablet Take 40 mg by mouth daily.      . methscopolamine (PAMINE FORTE) 5 MG tablet Take 5 mg by mouth at bedtime.      . montelukast (SINGULAIR) 10 MG tablet Take 10 mg by mouth daily.      Marland Kitchen MYRBETRIQ 50 MG TB24 tablet Take 50 mg by mouth daily.  11  . venlafaxine (EFFEXOR-XR) 150 MG 24 hr capsule Take 150 mg by mouth daily.      Marland Kitchen VICTOZA 18 MG/3ML SOPN Inject 1.8 mg into the skin daily.  4  . ZENPEP 20000 UNITS CPEP TAKE 2 CAPSULES (40,000 UNITS TOTAL) BY MOUTH 3 (THREE) TIMES DAILY. 180 capsule 5   No current facility-administered medications on file prior to visit.    Allergies  Allergen Reactions  . Codeine   . Iodinated Diagnostic Agents Rash    Past Medical History  Diagnosis Date  . Coronary artery disease   . GERD (gastroesophageal reflux disease)     hiatial hernia  . Dyslipidemia   . Dizziness     chronic  . Depression   . Normal nuclear stress test  nml 01/03/10;06/19/07  . Arthropathy, unspecified, site unspecified   . Irritable bowel syndrome   . Melanosis coli   . Unspecified essential hypertension   . Other, mixed, or unspecified nondependent drug abuse, unspecified   . Hepatomegaly   . Hiatal hernia   . Fatty liver   . Vitamin B12 deficiency   . Pancreatitis, chronic ([place])   . Blind loop syndrome   . Diabetes insipidus ([place])   . Diabetes mellitus   . Carotid artery disease ([place])     documented occlusion of left ICA  . Peripheral vascular disease ([place]) 02/20/10    s/p stent of left SFA  . Diverticulosis   . HTN (hypertension)   . Thyroid nodule   . Metatarsal fracture right    Past Surgical History  Procedure Laterality Date  . Breast enhancement surgery    . Tonsillectomy    . Wrist surgery      right  . Abdominal hysterectomy      partial  . Cardiac catheterization  07/07/98;08/31/01;03/04/05    60 -70%  LAD  . Femoral artery stent      Left leg  . Patella realignment Left   . Total abdominal hysterectomy    . Cataract extraction      History  Smoking status  . Former Smoker -- 1.00 packs/day for 30 years  . Types: Cigarettes  . Quit date: 10/14/2010  Smokeless tobacco  . Never Used    History  Alcohol Use No    Family History  Problem Relation Age of Onset  . Hypertension Father     aneurysm  . Stroke    . Hypertension Mother   . Colon cancer Paternal Grandfather     Review of Systems: The review of systems is positive for indigestion and burning in her throat.  Chronic abdominal pain. All other systems were reviewed and are negative.  Physical Exam: BP 120/76 mmHg  Pulse 72  Ht 5\' 3"  (1.6 m)  Wt 85.73 kg (189 lb)  BMI 33.49 kg/m2 She is an obese white female in no acute distress. Her HEENT exam is unremarkable. Pupils are equal round and reactive. Sclera clear. Oropharynx is clear. Neck is supple no JVD, adenopathy, thyromegaly or bruits. Lungs reveal scattered wheezes. Cardiac exam reveals a regular rate and rhythm without gallop, murmur, or click.  Abdomen is soft and nontender. She has no edema. Her pedal pulses are reduced but palpable. She is alert and oriented x3. Cranial nerves II through XII are intact.  LABORATORY DATA: Myoview 11/03/15:  Study Highlights     There was no ST segment deviation noted during stress.  The study is normal.  This is a low risk study.  This study was note gated due to frequent PVCs. Therefore no ejection fraction information is available.     Assessment / Plan: 1. CAD with chronic moderate disease in the mid LAD. Recent single episode of chest pain. Now resolved. Myoview study was normal.  She has multiple potential causes for her chest pain including COPD, GI, or musculoskeletal. Since she feels better on Imdur will continue. I would not recommend further evaluation unless symptoms markedly progress or she has objective  evidence of ischemia.  2. Chronic tobacco abuse, recommend smoking cessation  3. COPD  4. DM type 2  5. HTN controlled.  6. Hyperlipidemia. On statin.  7. GERD  8. Chronic pancreatitis. Given recent GI symptoms recommend follow up with GI.

## 2015-11-08 NOTE — Telephone Encounter (Signed)
-----   Message from Erlene Quan, [place] sent at 11/07/2015  2:03 PM EST ----- Please let pt know her stress test looked normal except for occasional extra beats  LUKE KILROY PA-C 11/07/2015 2:03 PM

## 2015-11-08 NOTE — Telephone Encounter (Signed)
Left message to call back -test results

## 2015-11-08 NOTE — Patient Instructions (Addendum)
Continue your therapy - use Ntg as needed for chest pain.   I would recommend follow up with Dr. Fuller Plan for your GI symptoms.

## 2015-11-09 NOTE — Telephone Encounter (Signed)
Follow up  ° ° ° °Returning call back to nurse  °

## 2015-11-09 NOTE — Telephone Encounter (Signed)
Spoke to patient. STRESS TEST Result given . Verbalized understanding

## 2015-11-17 ENCOUNTER — Encounter: Payer: Self-pay | Admitting: Family

## 2015-11-22 ENCOUNTER — Ambulatory Visit (INDEPENDENT_AMBULATORY_CARE_PROVIDER_SITE_OTHER)
Admission: RE | Admit: 2015-11-22 | Discharge: 2015-11-22 | Disposition: A | Discharge status: Home / Self Care | Payer: Medicare Other | Source: Ambulatory Visit | Attending: Family | Admitting: Family

## 2015-11-22 ENCOUNTER — Encounter: Payer: Self-pay | Admitting: Family

## 2015-11-22 ENCOUNTER — Ambulatory Visit (INDEPENDENT_AMBULATORY_CARE_PROVIDER_SITE_OTHER): Payer: Medicare Other | Admitting: Family

## 2015-11-22 ENCOUNTER — Ambulatory Visit (HOSPITAL_COMMUNITY)
Admission: RE | Admit: 2015-11-22 | Discharge: 2015-11-22 | Disposition: A | Discharge status: Home / Self Care | Payer: Medicare Other | Source: Ambulatory Visit | Attending: Family | Admitting: Family

## 2015-11-22 VITALS — BP 135/82 | HR 88 | Temp 98.4°F | Resp 16 | Ht 63.0 in | Wt 186.0 lb

## 2015-11-22 DIAGNOSIS — I6521 Occlusion and stenosis of right carotid artery: Secondary | ICD-10-CM

## 2015-11-22 DIAGNOSIS — Z959 Presence of cardiac and vascular implant and graft, unspecified: Secondary | ICD-10-CM

## 2015-11-22 DIAGNOSIS — Z4889 Encounter for other specified surgical aftercare: Secondary | ICD-10-CM | POA: Diagnosis not present

## 2015-11-22 DIAGNOSIS — E1151 Type 2 diabetes mellitus with diabetic peripheral angiopathy without gangrene: Secondary | ICD-10-CM | POA: Diagnosis not present

## 2015-11-22 DIAGNOSIS — I739 Peripheral vascular disease, unspecified: Secondary | ICD-10-CM

## 2015-11-22 DIAGNOSIS — E785 Hyperlipidemia, unspecified: Secondary | ICD-10-CM | POA: Insufficient documentation

## 2015-11-22 DIAGNOSIS — I6523 Occlusion and stenosis of bilateral carotid arteries: Secondary | ICD-10-CM | POA: Insufficient documentation

## 2015-11-22 DIAGNOSIS — Z48812 Encounter for surgical aftercare following surgery on the circulatory system: Secondary | ICD-10-CM

## 2015-11-22 DIAGNOSIS — E119 Type 2 diabetes mellitus without complications: Secondary | ICD-10-CM | POA: Diagnosis not present

## 2015-11-22 DIAGNOSIS — I779 Disorder of arteries and arterioles, unspecified: Secondary | ICD-10-CM | POA: Diagnosis not present

## 2015-11-22 DIAGNOSIS — Z720 Tobacco use: Secondary | ICD-10-CM

## 2015-11-22 DIAGNOSIS — F17200 Nicotine dependence, unspecified, uncomplicated: Secondary | ICD-10-CM

## 2015-11-22 DIAGNOSIS — I6522 Occlusion and stenosis of left carotid artery: Secondary | ICD-10-CM

## 2015-11-22 DIAGNOSIS — I10 Essential (primary) hypertension: Secondary | ICD-10-CM | POA: Diagnosis not present

## 2015-11-22 DIAGNOSIS — R0989 Other specified symptoms and signs involving the circulatory and respiratory systems: Secondary | ICD-10-CM | POA: Diagnosis present

## 2015-11-22 NOTE — Patient Instructions (Signed)
Stroke Prevention Some medical conditions and behaviors are associated with an increased chance of having a stroke. You may prevent a stroke by making healthy choices and managing medical conditions. HOW CAN I REDUCE MY RISK OF HAVING A STROKE?   Stay physically active. Get at least 30 minutes of activity on most or all days.  Do not smoke. It may also be helpful to avoid exposure to secondhand smoke.  Limit alcohol use. Moderate alcohol use is considered to be:  No more than 2 drinks per day for men.  No more than 1 drink per day for nonpregnant women.  Eat healthy foods. This involves:  Eating 5 or more servings of fruits and vegetables a day.  Making dietary changes that address high blood pressure (hypertension), high cholesterol, diabetes, or obesity.  Manage your cholesterol levels.  Making food choices that are high in fiber and low in saturated fat, trans fat, and cholesterol may control cholesterol levels.  Take any prescribed medicines to control cholesterol as directed by your health care provider.  Manage your diabetes.  Controlling your carbohydrate and sugar intake is recommended to manage diabetes.  Take any prescribed medicines to control diabetes as directed by your health care provider.  Control your hypertension.  Making food choices that are low in salt (sodium), saturated fat, trans fat, and cholesterol is recommended to manage hypertension.  Ask your health care provider if you need treatment to lower your blood pressure. Take any prescribed medicines to control hypertension as directed by your health care provider.  If you are 18-39 years of age, have your blood pressure checked every 3-5 years. If you are 40 years of age or older, have your blood pressure checked every year.  Maintain a healthy weight.  Reducing calorie intake and making food choices that are low in sodium, saturated fat, trans fat, and cholesterol are recommended to manage  weight.  Stop drug abuse.  Avoid taking birth control pills.  Talk to your health care provider about the risks of taking birth control pills if you are over 35 years old, smoke, get migraines, or have ever had a blood clot.  Get evaluated for sleep disorders (sleep apnea).  Talk to your health care provider about getting a sleep evaluation if you snore a lot or have excessive sleepiness.  Take medicines only as directed by your health care provider.  For some people, aspirin or blood thinners (anticoagulants) are helpful in reducing the risk of forming abnormal blood clots that can lead to stroke. If you have the irregular heart rhythm of atrial fibrillation, you should be on a blood thinner unless there is a good reason you cannot take them.  Understand all your medicine instructions.  Make sure that other conditions (such as anemia or atherosclerosis) are addressed. SEEK IMMEDIATE MEDICAL CARE IF:   You have sudden weakness or numbness of the face, arm, or leg, especially on one side of the body.  Your face or eyelid droops to one side.  You have sudden confusion.  You have trouble speaking (aphasia) or understanding.  You have sudden trouble seeing in one or both eyes.  You have sudden trouble walking.  You have dizziness.  You have a loss of balance or coordination.  You have a sudden, severe headache with no known cause.  You have new chest pain or an irregular heartbeat. Any of these symptoms may represent a serious problem that is an emergency. Do not wait to see if the symptoms will   go away. Get medical help at once. Call your local emergency services (911 in [place] Do not drive yourself to the hospital.   This information is not intended to replace advice given to you by your health care provider. Make sure you discuss any questions you have with your health care provider.   Document Released: 11/07/2004 Document Revised: 10/21/2014 Document Reviewed:  04/02/2013 Elsevier Interactive Patient Education 2016 [employer]    Peripheral Vascular Disease Peripheral vascular disease (PVD) is a disease of the blood vessels that are not part of your heart and brain. A simple term for PVD is poor circulation. In most cases, PVD narrows the blood vessels that carry blood from your heart to the rest of your body. This can result in a decreased supply of blood to your arms, legs, and internal organs, like your stomach or kidneys. However, it most often affects a person's lower legs and feet. There are two types of PVD.  Organic PVD. This is the more common type. It is caused by damage to the structure of blood vessels.  Functional PVD. This is caused by conditions that make blood vessels contract and tighten (spasm). Without treatment, PVD tends to get worse over time. PVD can also lead to acute ischemic limb. This is when an arm or limb suddenly has trouble getting enough blood. This is a medical emergency. CAUSES Each type of PVD has many different causes. The most common cause of PVD is buildup of a fatty material (plaque) inside of your arteries (atherosclerosis). Small amounts of plaque can break off from the walls of the blood vessels and become lodged in a smaller artery. This blocks blood flow and can cause acute ischemic limb. Other common causes of PVD include:  Blood clots that form inside of blood vessels.  Injuries to blood vessels.  Diseases that cause inflammation of blood vessels or cause blood vessel spasms.  Health behaviors and health history that increase your risk of developing PVD. RISK FACTORS  You may have a greater risk of PVD if you:  Have a family history of PVD.  Have certain medical conditions, including:  High cholesterol.  Diabetes.  High blood pressure (hypertension).  Coronary heart disease.  Past problems with blood clots.  Past injury, such as burns or a broken bone. These may have damaged blood  vessels in your limbs.  Buerger disease. This is caused by inflamed blood vessels in your hands and feet.  Some forms of arthritis.  Rare birth defects that affect the arteries in your legs.  Use tobacco.  Do not get enough exercise.  Are obese.  Are age 50 or older. SIGNS AND SYMPTOMS  PVD may cause many different symptoms. Your symptoms depend on what part of your body is not getting enough blood. Some common signs and symptoms include:  Cramps in your lower legs. This may be a symptom of poor leg circulation (claudication).  Pain and weakness in your legs while you are physically active that goes away when you rest (intermittent claudication).  Leg pain when at rest.  Leg numbness, tingling, or weakness.  Coldness in a leg or foot, especially when compared with the other leg.  Skin or hair changes. These can include:  Hair loss.  Shiny skin.  Pale or bluish skin.  Thick toenails.  Inability to get or maintain an erection (erectile dysfunction). People with PVD are more prone to developing ulcers and sores on their toes, feet, or legs. These may take longer than   normal to heal. DIAGNOSIS Your health care provider may diagnose PVD from your signs and symptoms. The health care provider will also do a physical exam. You may have tests to find out what is causing your PVD and determine its severity. Tests may include:  Blood pressure recordings from your arms and legs and measurements of the strength of your pulses (pulse volume recordings).  Imaging studies using sound waves to take pictures of the blood flow through your blood vessels (Doppler ultrasound).  Injecting a dye into your blood vessels before having imaging studies using:  X-rays (angiogram or arteriogram).  Computer-generated X-rays (CT angiogram).  A powerful electromagnetic field and a computer (magnetic resonance angiogram or MRA). TREATMENT Treatment for PVD depends on the cause of your condition  and the severity of your symptoms. It also depends on your age. Underlying causes need to be treated and controlled. These include long-lasting (chronic) conditions, such as diabetes, high cholesterol, and high blood pressure. You may need to first try making lifestyle changes and taking medicines. Surgery may be needed if these do not work. Lifestyle changes may include:  Quitting smoking.  Exercising regularly.  Following a low-fat, low-cholesterol diet. Medicines may include:  Blood thinners to prevent blood clots.  Medicines to improve blood flow.  Medicines to improve your blood cholesterol levels. Surgical procedures may include:  A procedure that uses an inflated balloon to open a blocked artery and improve blood flow (angioplasty).  A procedure to put in a tube (stent) to keep a blocked artery open (stent implant).  Surgery to reroute blood flow around a blocked artery (peripheral bypass surgery).  Surgery to remove dead tissue from an infected wound on the affected limb.  Amputation. This is surgical removal of the affected limb. This may be necessary in cases of acute ischemic limb that are not improved through medical or surgical treatments. HOME CARE INSTRUCTIONS  Take medicines only as directed by your health care provider.  Do not use any tobacco products, including cigarettes, chewing tobacco, or electronic cigarettes. If you need help quitting, ask your health care provider.  Lose weight if you are overweight, and maintain a healthy weight as directed by your health care provider.  Eat a diet that is low in fat and cholesterol. If you need help, ask your health care provider.  Exercise regularly. Ask your health care provider to suggest some good activities for you.  Use compression stockings or other mechanical devices as directed by your health care provider.  Take good care of your feet.  Wear comfortable shoes that fit well.  Check your feet often for  any cuts or sores. SEEK MEDICAL CARE IF:  You have cramps in your legs while walking.  You have leg pain when you are at rest.  You have coldness in a leg or foot.  Your skin changes.  You have erectile dysfunction.  You have cuts or sores on your feet that are not healing. SEEK IMMEDIATE MEDICAL CARE IF:  Your arm or leg turns cold and blue.  Your arms or legs become red, warm, swollen, painful, or numb.  You have chest pain or trouble breathing.  You suddenly have weakness in your face, arm, or leg.  You become very confused or lose the ability to speak.  You suddenly have a very bad headache or lose your vision.   This information is not intended to replace advice given to you by your health care provider. Make sure you discuss any questions   you have with your health care provider.   Document Released: 11/07/2004 Document Revised: 10/21/2014 Document Reviewed: 03/10/2014 Elsevier Interactive Patient Education 2016 [employer]    Smoking Cessation, Tips for Success If you are ready to quit smoking, congratulations! You have chosen to help yourself be healthier. Cigarettes bring nicotine, tar, carbon monoxide, and other irritants into your body. Your lungs, heart, and blood vessels will be able to work better without these poisons. There are many different ways to quit smoking. Nicotine gum, nicotine patches, a nicotine inhaler, or nicotine nasal spray can help with physical craving. Hypnosis, support groups, and medicines help break the habit of smoking. WHAT THINGS CAN I DO TO MAKE QUITTING EASIER?  Here are some tips to help you quit for good:  Pick a date when you will quit smoking completely. Tell all of your friends and family about your plan to quit on that date.  Do not try to slowly cut down on the number of cigarettes you are smoking. Pick a quit date and quit smoking completely starting on that day.  Throw away all cigarettes.   Clean and remove all  ashtrays from your home, work, and car.  On a card, write down your reasons for quitting. Carry the card with you and read it when you get the urge to smoke.  Cleanse your body of nicotine. Drink enough water and fluids to keep your urine clear or pale yellow. Do this after quitting to flush the nicotine from your body.  Learn to predict your moods. Do not let a bad situation be your excuse to have a cigarette. Some situations in your life might tempt you into wanting a cigarette.  Never have "just one" cigarette. It leads to wanting another and another. Remind yourself of your decision to quit.  Change habits associated with smoking. If you smoked while driving or when feeling stressed, try other activities to replace smoking. Stand up when drinking your coffee. Brush your teeth after eating. Sit in a different chair when you read the paper. Avoid alcohol while trying to quit, and try to drink fewer caffeinated beverages. Alcohol and caffeine may urge you to smoke.  Avoid foods and drinks that can trigger a desire to smoke, such as sugary or spicy foods and alcohol.  Ask people who smoke not to smoke around you.  Have something planned to do right after eating or having a cup of coffee. For example, plan to take a walk or exercise.  Try a relaxation exercise to calm you down and decrease your stress. Remember, you may be tense and nervous for the first 2 weeks after you quit, but this will pass.  Find new activities to keep your hands busy. Play with a pen, coin, or rubber band. Doodle or draw things on paper.  Brush your teeth right after eating. This will help cut down on the craving for the taste of tobacco after meals. You can also try mouthwash.   Use oral substitutes in place of cigarettes. Try using lemon drops, carrots, cinnamon sticks, or chewing gum. Keep them handy so they are available when you have the urge to smoke.  When you have the urge to smoke, try deep  breathing.  Designate your home as a nonsmoking area.  If you are a heavy smoker, ask your health care provider about a prescription for nicotine chewing gum. It can ease your withdrawal from nicotine.  Reward yourself. Set aside the cigarette money you save and buy yourself   something nice.  Look for support from others. Join a support group or smoking cessation program. Ask someone at home or at work to help you with your plan to quit smoking.  Always ask yourself, "Do I need this cigarette or is this just a reflex?" Tell yourself, "Today, I choose not to smoke," or "I do not want to smoke." You are reminding yourself of your decision to quit.  Do not replace cigarette smoking with electronic cigarettes (commonly called e-cigarettes). The safety of e-cigarettes is unknown, and some may contain harmful chemicals.  If you relapse, do not give up! Plan ahead and think about what you will do the next time you get the urge to smoke. HOW WILL I FEEL WHEN I QUIT SMOKING? You may have symptoms of withdrawal because your body is used to nicotine (the addictive substance in cigarettes). You may crave cigarettes, be irritable, feel very hungry, cough often, get headaches, or have difficulty concentrating. The withdrawal symptoms are only temporary. They are strongest when you first quit but will go away within 10-14 days. When withdrawal symptoms occur, stay in control. Think about your reasons for quitting. Remind yourself that these are signs that your body is healing and getting used to being without cigarettes. Remember that withdrawal symptoms are easier to treat than the major diseases that smoking can cause.  Even after the withdrawal is over, expect periodic urges to smoke. However, these cravings are generally short lived and will go away whether you smoke or not. Do not smoke! WHAT RESOURCES ARE AVAILABLE TO HELP ME QUIT SMOKING? Your health care provider can direct you to community resources or  hospitals for support, which may include:  Group support.  Education.  Hypnosis.  Therapy.   This information is not intended to replace advice given to you by your health care provider. Make sure you discuss any questions you have with your health care provider.   Document Released: 06/28/2004 Document Revised: 10/21/2014 Document Reviewed: 03/18/2013 Elsevier Interactive Patient Education 2016 [employer] Inc[employer]    Steps to Quit Smoking  Smoking tobacco can be harmful to your health and can affect almost every organ in your body. Smoking puts you, and those around you, at risk for developing many serious chronic diseases. Quitting smoking is difficult, but it is one of the best things that you can do for your health. It is never too late to quit. WHAT ARE THE BENEFITS OF QUITTING SMOKING? When you quit smoking, you lower your risk of developing serious diseases and conditions, such as:  Lung cancer or lung disease, such as COPD.  Heart disease.  Stroke.  Heart attack.  Infertility.  Osteoporosis and bone fractures. Additionally, symptoms such as coughing, wheezing, and shortness of breath may get better when you quit. You may also find that you get sick less often because your body is stronger at fighting off colds and infections. If you are pregnant, quitting smoking can help to reduce your chances of having a baby of low birth weight. HOW DO I GET READY TO QUIT? When you decide to quit smoking, create a plan to make sure that you are successful. Before you quit:  Pick a date to quit. Set a date within the next two weeks to give you time to prepare.  Write down the reasons why you are quitting. Keep this list in places where you will see it often, such as on your bathroom mirror or in your car or wallet.  Identify the people, places,   things, and activities that make you want to smoke (triggers) and avoid them. Make sure to take these actions:  Throw away all cigarettes at  home, at work, and in your car.  Throw away smoking accessories, such as ashtrays and lighters.  Clean your car and make sure to empty the ashtray.  Clean your home, including curtains and carpets.  Tell your family, friends, and coworkers that you are quitting. Support from your loved ones can make quitting easier.  Talk with your health care provider about your options for quitting smoking.  Find out what treatment options are covered by your health insurance. WHAT STRATEGIES CAN I USE TO QUIT SMOKING?  Talk with your healthcare provider about different strategies to quit smoking. Some strategies include:  Quitting smoking altogether instead of gradually lessening how much you smoke over a period of time. Research shows that quitting "cold turkey" is more successful than gradually quitting.  Attending in-person counseling to help you build problem-solving skills. You are more likely to have success in quitting if you attend several counseling sessions. Even short sessions of 10 minutes can be effective.  Finding resources and support systems that can help you to quit smoking and remain smoke-free after you quit. These resources are most helpful when you use them often. They can include:  Online chats with a counselor.  Telephone quitlines.  Printed self-help materials.  Support groups or group counseling.  Text messaging programs.  Mobile phone applications.  Taking medicines to help you quit smoking. (If you are pregnant or breastfeeding, talk with your health care provider first.) Some medicines contain nicotine and some do not. Both types of medicines help with cravings, but the medicines that include nicotine help to relieve withdrawal symptoms. Your health care provider may recommend:  Nicotine patches, gum, or lozenges.  Nicotine inhalers or sprays.  Non-nicotine medicine that is taken by mouth. Talk with your health care provider about combining strategies, such as  taking medicines while you are also receiving in-person counseling. Using these two strategies together makes you more likely to succeed in quitting than if you used either strategy on its own. If you are pregnant or breastfeeding, talk with your health care provider about finding counseling or other support strategies to quit smoking. Do not take medicine to help you quit smoking unless told to do so by your health care provider. WHAT THINGS CAN I DO TO MAKE IT EASIER TO QUIT? Quitting smoking might feel overwhelming at first, but there is a lot that you can do to make it easier. Take these important actions:  Reach out to your family and friends and ask that they support and encourage you during this time. Call telephone quitlines, reach out to support groups, or work with a counselor for support.  Ask people who smoke to avoid smoking around you.  Avoid places that trigger you to smoke, such as bars, parties, or smoke-break areas at work.  Spend time around people who do not smoke.  Lessen stress in your life, because stress can be a smoking trigger for some people. To lessen stress, try:  Exercising regularly.  Deep-breathing exercises.  Yoga.  Meditating.  Performing a body scan. This involves closing your eyes, scanning your body from head to toe, and noticing which parts of your body are particularly tense. Purposefully relax the muscles in those areas.  Download or purchase mobile phone or tablet apps (applications) that can help you stick to your quit plan by providing reminders, tips,   and encouragement. There are many free apps, such as QuitGuide from the CDC (Centers for Disease Control and Prevention). You can find other support for quitting smoking (smoking cessation) through smokefree.gov and other websites. HOW WILL I FEEL WHEN I QUIT SMOKING? Within the first 24 hours of quitting smoking, you may start to feel some withdrawal symptoms. These symptoms are usually most  noticeable 2-3 days after quitting, but they usually do not last beyond 2-3 weeks. Changes or symptoms that you might experience include:  Mood swings.  Restlessness, anxiety, or irritation.  Difficulty concentrating.  Dizziness.  Strong cravings for sugary foods in addition to nicotine.  Mild weight gain.  Constipation.  Nausea.  Coughing or a sore throat.  Changes in how your medicines work in your body.  A depressed mood.  Difficulty sleeping (insomnia). After the first 2-3 weeks of quitting, you may start to notice more positive results, such as:  Improved sense of smell and taste.  Decreased coughing and sore throat.  Slower heart rate.  Lower blood pressure.  Clearer skin.  The ability to breathe more easily.  Fewer sick days. Quitting smoking is very challenging for most people. Do not get discouraged if you are not successful the first time. Some people need to make many attempts to quit before they achieve long-term success. Do your best to stick to your quit plan, and talk with your health care provider if you have any questions or concerns.   This information is not intended to replace advice given to you by your health care provider. Make sure you discuss any questions you have with your health care provider.   Document Released: 09/24/2001 Document Revised: 02/14/2015 Document Reviewed: 02/14/2015 Elsevier Interactive Patient Education 2016 [employer]  

## 2015-11-22 NOTE — Progress Notes (Signed)
[facility] [place] HISTORY AND PHYSICAL   MRN : [id number]  History of Present Illness:   [name] is a 70 y.o. female patient of Dr. Kellie Simmering who is status post a left SFA stent May of 2011, she also has a known left ICA occlusion.  She denies claudication symptoms with walking, denies non healing wounds. She denies any known history of stroke or TIA.   She saw her cardiologist, Dr. Martinique, last week, for jaw and chest pain. Pt states she then had a stress test and states the results were OK.  She had left cataract extraction with IOL; right cataract was addressed about 2007.   Pt Diabetic: Yes, states it is in control Pt smoker: smoker  (1 ppd, started at age 46 yrs)  Pt meds include: Statin :Yes Betablocker: Yes ASA: No, pt states she was advised by Dr. Martinique to take a daily 81 mg ASA Other anticoagulants/antiplatelets: Plavix   Current Outpatient Prescriptions  Medication Sig Dispense Refill  . ABILIFY 5 MG tablet Take 5 mg by mouth daily.     Marland Kitchen amLODipine (NORVASC) 10 MG tablet Take 10 mg by mouth daily.      Marland Kitchen atenolol (TENORMIN) 50 MG tablet Take 50 mg by mouth 2 (two) times daily.     Marland Kitchen atorvastatin (LIPITOR) 20 MG tablet Take 20 mg by mouth daily.      . clopidogrel (PLAVIX) 75 MG tablet Take 1 tablet (75 mg total) by mouth daily. 30 tablet 3  . Cyanocobalamin (VITAMIN B-12 PO) Take by mouth daily.    Marland Kitchen esomeprazole (NEXIUM) 40 MG capsule Take 40 mg by mouth 2 (two) times daily.      . furosemide (LASIX) 20 MG tablet Take 20 mg by mouth daily.  3  . gabapentin (NEURONTIN) 300 MG capsule Take 300 mg by mouth 2 (two) times daily.     Marland Kitchen HYDROcodone-acetaminophen (VICODIN) 5-500 MG per tablet Take 1 tablet by mouth as needed.      . isosorbide mononitrate (IMDUR) 30 MG 24 hr tablet Take 0.5 tablets (15 mg total) by mouth daily. 15 tablet 11  . lisinopril (PRINIVIL,ZESTRIL) 40 MG tablet Take 40 mg by mouth daily.      . methscopolamine (PAMINE  FORTE) 5 MG tablet Take 5 mg by mouth at bedtime.      . montelukast (SINGULAIR) 10 MG tablet Take 10 mg by mouth daily.      Marland Kitchen MYRBETRIQ 50 MG TB24 tablet Take 50 mg by mouth daily.  11  . nitroGLYCERIN (NITROSTAT) 0.4 MG SL tablet Place 1 tablet (0.4 mg total) under the tongue every 5 (five) minutes as needed for chest pain. 90 tablet 3  . ONETOUCH VERIO test strip     . venlafaxine (EFFEXOR-XR) 150 MG 24 hr capsule Take 150 mg by mouth daily.      Marland Kitchen VICTOZA 18 MG/3ML SOPN Inject 1.8 mg into the skin daily.  4  . ZENPEP 20000 UNITS CPEP TAKE 2 CAPSULES (40,000 UNITS TOTAL) BY MOUTH 3 (THREE) TIMES DAILY. 180 capsule 5  . ondansetron (ZOFRAN) 4 MG/5ML solution Reported on 11/22/2015     No current facility-administered medications for this visit.    Past Medical History  Diagnosis Date  . Coronary artery disease   . GERD (gastroesophageal reflux disease)     hiatial hernia  . Dyslipidemia   . Dizziness     chronic  . Depression   . Normal nuclear stress test  nml 01/03/10;06/19/07  . Arthropathy, unspecified, site unspecified   . Irritable bowel syndrome   . Melanosis coli   . Unspecified essential hypertension   . Other, mixed, or unspecified nondependent drug abuse, unspecified   . Hepatomegaly   . Hiatal hernia   . Fatty liver   . Vitamin B12 deficiency   . Pancreatitis, chronic ([place])   . Blind loop syndrome   . Diabetes insipidus ([place])   . Diabetes mellitus   . Carotid artery disease ([place])     documented occlusion of left ICA  . Peripheral vascular disease ([place]) 02/20/10    s/p stent of left SFA  . Diverticulosis   . HTN (hypertension)   . Thyroid nodule   . Metatarsal fracture right    Social History Social History  Substance Use Topics  . Smoking status: Former Smoker -- 1.00 packs/day for 30 years    Types: Cigarettes    Quit date: 10/14/2010  . Smokeless tobacco: Never Used  . Alcohol Use: No    Family History Family History  Problem Relation Age of  Onset  . Hypertension Father     aneurysm  . Stroke    . Hypertension Mother   . Colon cancer Paternal Grandfather     Surgical History Past Surgical History  Procedure Laterality Date  . Breast enhancement surgery    . Tonsillectomy    . Wrist surgery      right  . Abdominal hysterectomy      partial  . Cardiac catheterization  07/07/98;08/31/01;03/04/05    60 -70% LAD  . Femoral artery stent      Left leg  . Patella realignment Left   . Total abdominal hysterectomy    . Cataract extraction    . Eye surgery Left Nov. 2016    Cataract  . Eye surgery Right 2001    Cataract    Allergies  Allergen Reactions  . Codeine   . Iodinated Diagnostic Agents Rash    Current Outpatient Prescriptions  Medication Sig Dispense Refill  . ABILIFY 5 MG tablet Take 5 mg by mouth daily.     Marland Kitchen amLODipine (NORVASC) 10 MG tablet Take 10 mg by mouth daily.      Marland Kitchen atenolol (TENORMIN) 50 MG tablet Take 50 mg by mouth 2 (two) times daily.     Marland Kitchen atorvastatin (LIPITOR) 20 MG tablet Take 20 mg by mouth daily.      . clopidogrel (PLAVIX) 75 MG tablet Take 1 tablet (75 mg total) by mouth daily. 30 tablet 3  . Cyanocobalamin (VITAMIN B-12 PO) Take by mouth daily.    Marland Kitchen esomeprazole (NEXIUM) 40 MG capsule Take 40 mg by mouth 2 (two) times daily.      . furosemide (LASIX) 20 MG tablet Take 20 mg by mouth daily.  3  . gabapentin (NEURONTIN) 300 MG capsule Take 300 mg by mouth 2 (two) times daily.     Marland Kitchen HYDROcodone-acetaminophen (VICODIN) 5-500 MG per tablet Take 1 tablet by mouth as needed.      . isosorbide mononitrate (IMDUR) 30 MG 24 hr tablet Take 0.5 tablets (15 mg total) by mouth daily. 15 tablet 11  . lisinopril (PRINIVIL,ZESTRIL) 40 MG tablet Take 40 mg by mouth daily.      . methscopolamine (PAMINE FORTE) 5 MG tablet Take 5 mg by mouth at bedtime.      . montelukast (SINGULAIR) 10 MG tablet Take 10 mg by mouth daily.      Marland Kitchen MYRBETRIQ  50 MG TB24 tablet Take 50 mg by mouth daily.  11  .  nitroGLYCERIN (NITROSTAT) 0.4 MG SL tablet Place 1 tablet (0.4 mg total) under the tongue every 5 (five) minutes as needed for chest pain. 90 tablet 3  . ONETOUCH VERIO test strip     . venlafaxine (EFFEXOR-XR) 150 MG 24 hr capsule Take 150 mg by mouth daily.      Marland Kitchen VICTOZA 18 MG/3ML SOPN Inject 1.8 mg into the skin daily.  4  . ZENPEP 20000 UNITS CPEP TAKE 2 CAPSULES (40,000 UNITS TOTAL) BY MOUTH 3 (THREE) TIMES DAILY. 180 capsule 5  . ondansetron (ZOFRAN) 4 MG/5ML solution Reported on 11/22/2015     No current facility-administered medications for this visit.     REVIEW OF SYSTEMS: See HPI for pertinent positives and negatives.  Physical Examination Filed Vitals:   11/22/15 0918 11/22/15 0920  BP: 134/83 135/82  Pulse: 84 88  Temp:  98.4 F (36.9 C)  TempSrc:  Oral  Resp:  16  Height:  5\' 3"  (1.6 m)  Weight:  186 lb (84.369 kg)  SpO2:  95%   Body mass index is 32.96 kg/(m^2).  General:  WDWN, obese female in NAD Gait: Normal HENT: WNL Eyes: Pupils equal Pulmonary: normal non-labored breathing, no rales, rhonchi, or wheezing Cardiac: RRR, no murmur detected  Abdomen: soft, NT, no masses palpated Skin: no rashes, no ulcers, no cellulitis.   VASCULAR EXAM  Carotid Bruits Right Left   Negative Negative       Aorta is not palpable Radial pulses: right is 2+ left is 1+ palpable                      VASCULAR EXAM: Extremities without ischemic changes, without Gangrene; without open wounds.                                                                                                          LE Pulses Right Left       FEMORAL  2+ palpable  faintly palpable        POPLITEAL  not palpable   1+ palpable       POSTERIOR TIBIAL  2+ palpable   1+ palpable        DORSALIS PEDIS      ANTERIOR TIBIAL 2+ palpable  2+ palpable     Musculoskeletal: no muscle wasting or atrophy; no peripheral edema  Neurologic: A&O X 3; Appropriate Affect ;  SENSATION: normal; MOTOR  FUNCTION: 5/5 Symmetric, CN 2-12 intact except mild left facial droop with smile, Speech is fluent/normal   Significant Diagnostic Studies:  CT head in 2000: no evidence of stroke   Non-Invasive Vascular Imaging (11/22/2015):  Carotid Duplex: Evidence of 40-59% stenosis of the right internal carotid artery. Velocities may be elevated due to compensatory flow of contralateral occlusion. Left ICA confirmed occluded.   Significant (>50%) stenosis of the mid left CCA. No significant change compared to prior exam.    ABI (Date: 11/22/2015)  R: 1.05 (1.08, 05/05/14), DP:  triphasic, PT: triphasic, TBI: 0.69  L: 1.03 (1.05), DP: triphasic, PT: triphasic, TBI: 0.74   ASSESSMENT:  [name] is a 70 y.o. female who is status post a left SFA stent May of 2011, she also has a known left ICA occlusion.  She has no known history of stroke or TIA. She has no claudication symptoms with walking, no signs of ischemia in her feet/legs.  Today's carotid duplex suggests 40-59% stenosis of the right internal carotid artery. Velocities may be elevated due to compensatory flow of contralateral occlusion. Left ICA confirmed occluded.   Significant (>50%) stenosis of the mid left CCA. No significant change compared to prior exam.  ABI's and TBI's remain normal with all triphasic waveforms.  Her atherosclerotic risk factors include active smoking, controlled DM, and obesity. See Plan.   PLAN:   The patient was counseled re smoking cessation and given several free resources re smoking cessation.  Graduated walking program discussed.  Based on today's exam and non-invasive vascular lab results, the patient will follow up in 1 year with the following tests: carotid duplex, ABI's, and left LE arterial duplex. I discussed in depth with the patient the nature of atherosclerosis, and emphasized the importance of maximal medical management including strict control of blood pressure, blood glucose, and  lipid levels, obtaining regular exercise, and cessation of smoking.  The patient is aware that without maximal medical management the underlying atherosclerotic disease process will progress, limiting the benefit of any interventions.  The patient was given information about stroke prevention and what symptoms should prompt the patient to seek immediate medical care.  The patient was given information about PAD including signs, symptoms, treatment, what symptoms should prompt the patient to seek immediate medical care, and risk reduction measures to take. Thank you for allowing [place] to participate in this patient's care.  Clemon Chambers, RN, MSN, FNP-C [facility] Office: [phone]  Clinic MD: Scot Dock  11/22/2015 9:39 AM

## 2015-12-11 ENCOUNTER — Other Ambulatory Visit: Payer: Self-pay

## 2015-12-11 DIAGNOSIS — Z1231 Encounter for screening mammogram for malignant neoplasm of breast: Secondary | ICD-10-CM

## 2016-01-22 ENCOUNTER — Ambulatory Visit: Payer: Medicare Other

## 2016-01-29 DIAGNOSIS — R634 Abnormal weight loss: Secondary | ICD-10-CM | POA: Insufficient documentation

## 2016-01-29 DIAGNOSIS — J309 Allergic rhinitis, unspecified: Secondary | ICD-10-CM | POA: Insufficient documentation

## 2016-01-30 ENCOUNTER — Other Ambulatory Visit: Payer: Self-pay | Admitting: Internal Medicine

## 2016-01-30 DIAGNOSIS — E041 Nontoxic single thyroid nodule: Secondary | ICD-10-CM

## 2016-02-01 ENCOUNTER — Other Ambulatory Visit: Payer: Medicare Other

## 2016-02-05 ENCOUNTER — Ambulatory Visit
Admission: RE | Admit: 2016-02-05 | Discharge: 2016-02-05 | Disposition: A | Discharge status: Home / Self Care | Payer: Medicare Other | Source: Ambulatory Visit | Attending: Internal Medicine | Admitting: Internal Medicine

## 2016-02-05 DIAGNOSIS — E041 Nontoxic single thyroid nodule: Secondary | ICD-10-CM

## 2016-03-08 ENCOUNTER — Ambulatory Visit
Admission: RE | Admit: 2016-03-08 | Discharge: 2016-03-08 | Disposition: A | Discharge status: Home / Self Care | Payer: Medicare Other | Source: Ambulatory Visit

## 2016-03-08 DIAGNOSIS — Z1231 Encounter for screening mammogram for malignant neoplasm of breast: Secondary | ICD-10-CM

## 2016-07-10 ENCOUNTER — Other Ambulatory Visit: Payer: Self-pay | Admitting: Gastroenterology

## 2016-08-15 ENCOUNTER — Encounter: Payer: Self-pay | Admitting: Cardiology

## 2016-08-28 NOTE — Progress Notes (Signed)
[name] Date of Birth: 08-08-1946   History of Present Illness: [name] is seen today for evaluation of chest pain.  She does have a known history of coronary disease with a 60-70% stenosis in the mid LAD. She had cardiac caths in 1999, 2002,  2006, and 2012 that showed stable findings. Stress myoview in March 2011 was normal. Echo in 2007 showed mild LVH with normal EF. Mild AV sclerosis. She has a history of chronic tobacco abuse, HTN, hyperlipidemia, and DM. She has known carotid arterial disease with chronic occlusion of the left ICA and 40-59% RICA stenosis. This is followed yearly by [facility]. She has GERD and chronic pancreatitis.   She was seen in January 2017 for evaluation of chest pain. She reports an episode of severe jaw pain last month radiating into her neck, back, and chest. This improved with sl Ntg. She was placed on Imdur and Lexiscan myoview ordered. No recurrent chest pain or jaw pain. She generally feels better on Imdur. She does stay nauseated a lot and has bad indigestion. She has intermittent diarrhea and RUQ abdominal pain.   Current Outpatient Prescriptions on File Prior to Visit  Medication Sig Dispense Refill  . ABILIFY 5 MG tablet Take 5 mg by mouth daily.     Marland Kitchen amLODipine (NORVASC) 10 MG tablet Take 10 mg by mouth daily.      Marland Kitchen atenolol (TENORMIN) 50 MG tablet Take 50 mg by mouth 2 (two) times daily.     Marland Kitchen atorvastatin (LIPITOR) 20 MG tablet Take 20 mg by mouth daily.      . clopidogrel (PLAVIX) 75 MG tablet Take 1 tablet (75 mg total) by mouth daily. 30 tablet 3  . Cyanocobalamin (VITAMIN B-12 PO) Take by mouth daily.    Marland Kitchen esomeprazole (NEXIUM) 40 MG capsule Take 40 mg by mouth 2 (two) times daily.      . furosemide (LASIX) 20 MG tablet Take 20 mg by mouth daily.  3  . gabapentin (NEURONTIN) 300 MG capsule Take 300 mg by mouth 2 (two) times daily.     Marland Kitchen HYDROcodone-acetaminophen (VICODIN) 5-500 MG per tablet Take 1 tablet by mouth as needed.      . isosorbide  mononitrate (IMDUR) 30 MG 24 hr tablet Take 0.5 tablets (15 mg total) by mouth daily. 15 tablet 11  . lisinopril (PRINIVIL,ZESTRIL) 40 MG tablet Take 40 mg by mouth daily.      . methscopolamine (PAMINE FORTE) 5 MG tablet Take 5 mg by mouth at bedtime.      . montelukast (SINGULAIR) 10 MG tablet Take 10 mg by mouth daily.      Marland Kitchen MYRBETRIQ 50 MG TB24 tablet Take 50 mg by mouth daily.  11  . nitroGLYCERIN (NITROSTAT) 0.4 MG SL tablet Place 1 tablet (0.4 mg total) under the tongue every 5 (five) minutes as needed for chest pain. 90 tablet 3  . ondansetron (ZOFRAN) 4 MG/5ML solution Reported on 11/22/2015    . ONETOUCH VERIO test strip     . venlafaxine (EFFEXOR-XR) 150 MG 24 hr capsule Take 150 mg by mouth daily.      Marland Kitchen VICTOZA 18 MG/3ML SOPN Inject 1.8 mg into the skin daily.  4  . ZENPEP 20000 units CPEP TAKE 2 CAPSULES (40,000 UNITS TOTAL) BY MOUTH 3 (THREE) TIMES DAILY. 180 capsule 1   No current facility-administered medications on file prior to visit.     Allergies  Allergen Reactions  . Codeine   . Iodinated  Diagnostic Agents Rash    Past Medical History:  Diagnosis Date  . Arthropathy, unspecified, site unspecified   . Blind loop syndrome   . Carotid artery disease ([place])    documented occlusion of left ICA  . Coronary artery disease   . Depression   . Diabetes insipidus ([place])   . Diabetes mellitus   . Diverticulosis   . Dizziness    chronic  . Dyslipidemia   . Fatty liver   . GERD (gastroesophageal reflux disease)    hiatial hernia  . Hepatomegaly   . Hiatal hernia   . HTN (hypertension)   . Irritable bowel syndrome   . Melanosis coli   . Metatarsal fracture right  . Normal nuclear stress test    nml 01/03/10;06/19/07  . Other, mixed, or unspecified nondependent drug abuse, unspecified   . Pancreatitis, chronic ([place])   . Peripheral vascular disease ([place]) 02/20/10   s/p stent of left SFA  . Thyroid nodule   . Unspecified essential hypertension   . Vitamin B12  deficiency     Past Surgical History:  Procedure Laterality Date  . ABDOMINAL HYSTERECTOMY     partial  . BREAST ENHANCEMENT SURGERY    . CARDIAC CATHETERIZATION  07/07/98;08/31/01;03/04/05   60 -70% LAD  . CATARACT EXTRACTION    . EYE SURGERY Left Nov. 2016   Cataract  . EYE SURGERY Right 2001   Cataract  . FEMORAL ARTERY STENT     Left leg  . PATELLA REALIGNMENT Left   . TONSILLECTOMY    . TOTAL ABDOMINAL HYSTERECTOMY    . WRIST SURGERY     right    History  Smoking Status  . Former Smoker  . Packs/day: 1.00  . Years: 30.00  . Types: Cigarettes  . Quit date: 10/14/2010  Smokeless Tobacco  . Never Used    History  Alcohol Use No    Family History  Problem Relation Age of Onset  . Hypertension Mother   . Hypertension Father     aneurysm  . Stroke    . Colon cancer Paternal Grandfather     Review of Systems: The review of systems is as noted in HPI.  Chronic abdominal pain. All other systems were reviewed and are negative.  Physical Exam: BP 135/70   Pulse 93   Ht 5\' 3"  (1.6 m)   Wt 189 lb 12.8 oz (86.1 kg)   BMI 33.62 kg/m  She is an obese white female in no acute distress. Her HEENT exam is unremarkable. Pupils are equal round and reactive. Sclera clear. Oropharynx is clear. Neck is supple no JVD, adenopathy, thyromegaly or bruits. Lungs reveal scattered wheezes. Cardiac exam reveals a regular rate and rhythm without gallop, murmur, or click.  Abdomen is soft and nontender. She has no edema. Her pedal pulses are reduced but palpable. She is alert and oriented x3. Cranial nerves II through XII are intact.  LABORATORY DATA:  Reviewed from primary care dated 03/08/16: cholesterol 122, triglycerides 154, HDL 36, LDL 55. A1c 5.8%. CMET and TSH normal.   Myoview 11/03/15:  Study Highlights     There was no ST segment deviation noted during stress.  The study is normal.  This is a low risk study.  This study was note gated due to frequent PVCs. Therefore  no ejection fraction information is available.     Assessment / Plan: 1. CAD with chronic moderate disease in the mid LAD.  Myoview study was normal in January.  Continue current therapy.  I would not recommend further evaluation unless symptoms markedly progress or she has objective evidence of ischemia.  2. Chronic tobacco abuse, recommend smoking cessation  3. COPD  4. DM type 2  5. HTN controlled.  6. Hyperlipidemia. On statin.  7. GERD  8. Chronic pancreatitis. Chronic abdominal pain, nausea, diarrhea. Recommend followup with GI.

## 2016-08-30 ENCOUNTER — Encounter: Payer: Self-pay | Admitting: Cardiology

## 2016-08-30 ENCOUNTER — Ambulatory Visit (INDEPENDENT_AMBULATORY_CARE_PROVIDER_SITE_OTHER): Payer: Medicare Other | Admitting: Cardiology

## 2016-08-30 VITALS — BP 135/70 | HR 93 | Ht 63.0 in | Wt 189.8 lb

## 2016-08-30 DIAGNOSIS — I10 Essential (primary) hypertension: Secondary | ICD-10-CM

## 2016-08-30 DIAGNOSIS — I779 Disorder of arteries and arterioles, unspecified: Secondary | ICD-10-CM | POA: Diagnosis not present

## 2016-08-30 DIAGNOSIS — I25118 Atherosclerotic heart disease of native coronary artery with other forms of angina pectoris: Secondary | ICD-10-CM

## 2016-08-30 DIAGNOSIS — I739 Peripheral vascular disease, unspecified: Principal | ICD-10-CM

## 2016-08-30 NOTE — Patient Instructions (Addendum)
Continue  Your current therapy  Quit smoking  I will see you in 6-8 months

## 2016-09-12 ENCOUNTER — Ambulatory Visit
Admission: RE | Admit: 2016-09-12 | Discharge: 2016-09-12 | Disposition: A | Discharge status: Home / Self Care | Payer: Medicare Other | Source: Ambulatory Visit | Attending: Internal Medicine | Admitting: Internal Medicine

## 2016-09-12 ENCOUNTER — Other Ambulatory Visit: Payer: Medicare Other

## 2016-09-12 ENCOUNTER — Other Ambulatory Visit: Payer: Self-pay | Admitting: Internal Medicine

## 2016-09-12 DIAGNOSIS — R110 Nausea: Secondary | ICD-10-CM

## 2016-10-13 ENCOUNTER — Other Ambulatory Visit: Payer: Self-pay | Admitting: Cardiology

## 2016-10-15 NOTE — Telephone Encounter (Signed)
Rx has been sent to the pharmacy electronically. ° °

## 2016-11-19 ENCOUNTER — Encounter: Payer: Self-pay | Admitting: Family

## 2016-11-26 ENCOUNTER — Ambulatory Visit (INDEPENDENT_AMBULATORY_CARE_PROVIDER_SITE_OTHER): Payer: Medicare Other | Admitting: Family

## 2016-11-26 ENCOUNTER — Encounter: Payer: Self-pay | Admitting: Family

## 2016-11-26 ENCOUNTER — Ambulatory Visit (INDEPENDENT_AMBULATORY_CARE_PROVIDER_SITE_OTHER)
Admission: RE | Admit: 2016-11-26 | Discharge: 2016-11-26 | Disposition: A | Discharge status: Home / Self Care | Payer: Medicare Other | Source: Ambulatory Visit | Attending: Family | Admitting: Family

## 2016-11-26 ENCOUNTER — Ambulatory Visit (HOSPITAL_COMMUNITY)
Admission: RE | Admit: 2016-11-26 | Discharge: 2016-11-26 | Disposition: A | Discharge status: Home / Self Care | Payer: Medicare Other | Source: Ambulatory Visit | Attending: Family | Admitting: Family

## 2016-11-26 VITALS — BP 164/82 | HR 83 | Temp 97.5°F | Resp 16 | Ht 63.0 in | Wt 181.0 lb

## 2016-11-26 DIAGNOSIS — E1151 Type 2 diabetes mellitus with diabetic peripheral angiopathy without gangrene: Secondary | ICD-10-CM

## 2016-11-26 DIAGNOSIS — I779 Disorder of arteries and arterioles, unspecified: Secondary | ICD-10-CM | POA: Insufficient documentation

## 2016-11-26 DIAGNOSIS — Z959 Presence of cardiac and vascular implant and graft, unspecified: Secondary | ICD-10-CM

## 2016-11-26 DIAGNOSIS — I6521 Occlusion and stenosis of right carotid artery: Secondary | ICD-10-CM

## 2016-11-26 DIAGNOSIS — I6523 Occlusion and stenosis of bilateral carotid arteries: Secondary | ICD-10-CM | POA: Diagnosis not present

## 2016-11-26 DIAGNOSIS — Z4889 Encounter for other specified surgical aftercare: Secondary | ICD-10-CM

## 2016-11-26 DIAGNOSIS — I6522 Occlusion and stenosis of left carotid artery: Secondary | ICD-10-CM

## 2016-11-26 DIAGNOSIS — F17200 Nicotine dependence, unspecified, uncomplicated: Secondary | ICD-10-CM | POA: Insufficient documentation

## 2016-11-26 DIAGNOSIS — Z48812 Encounter for surgical aftercare following surgery on the circulatory system: Secondary | ICD-10-CM

## 2016-11-26 NOTE — Patient Instructions (Signed)
Stroke Prevention Some medical conditions and behaviors are associated with an increased chance of having a stroke. You may prevent a stroke by making healthy choices and managing medical conditions. How can I reduce my risk of having a stroke?  Stay physically active. Get at least 30 minutes of activity on most or all days.  Do not smoke. It may also be helpful to avoid exposure to secondhand smoke.  Limit alcohol use. Moderate alcohol use is considered to be:  No more than 2 drinks per day for men.  No more than 1 drink per day for nonpregnant women.  Eat healthy foods. This involves:  Eating 5 or more servings of fruits and vegetables a day.  Making dietary changes that address high blood pressure (hypertension), high cholesterol, diabetes, or obesity.  Manage your cholesterol levels.  Making food choices that are high in fiber and low in saturated fat, trans fat, and cholesterol may control cholesterol levels.  Take any prescribed medicines to control cholesterol as directed by your health care provider.  Manage your diabetes.  Controlling your carbohydrate and sugar intake is recommended to manage diabetes.  Take any prescribed medicines to control diabetes as directed by your health care provider.  Control your hypertension.  Making food choices that are low in salt (sodium), saturated fat, trans fat, and cholesterol is recommended to manage hypertension.  Ask your health care provider if you need treatment to lower your blood pressure. Take any prescribed medicines to control hypertension as directed by your health care provider.  If you are 18-39 years of age, have your blood pressure checked every 3-5 years. If you are 40 years of age or older, have your blood pressure checked every year.  Maintain a healthy weight.  Reducing calorie intake and making food choices that are low in sodium, saturated fat, trans fat, and cholesterol are recommended to manage  weight.  Stop drug abuse.  Avoid taking birth control pills.  Talk to your health care provider about the risks of taking birth control pills if you are over 35 years old, smoke, get migraines, or have ever had a blood clot.  Get evaluated for sleep disorders (sleep apnea).  Talk to your health care provider about getting a sleep evaluation if you snore a lot or have excessive sleepiness.  Take medicines only as directed by your health care provider.  For some people, aspirin or blood thinners (anticoagulants) are helpful in reducing the risk of forming abnormal blood clots that can lead to stroke. If you have the irregular heart rhythm of atrial fibrillation, you should be on a blood thinner unless there is a good reason you cannot take them.  Understand all your medicine instructions.  Make sure that other conditions (such as anemia or atherosclerosis) are addressed. Get help right away if:  You have sudden weakness or numbness of the face, arm, or leg, especially on one side of the body.  Your face or eyelid droops to one side.  You have sudden confusion.  You have trouble speaking (aphasia) or understanding.  You have sudden trouble seeing in one or both eyes.  You have sudden trouble walking.  You have dizziness.  You have a loss of balance or coordination.  You have a sudden, severe headache with no known cause.  You have new chest pain or an irregular heartbeat. Any of these symptoms may represent a serious problem that is an emergency. Do not wait to see if the symptoms will go away.   Get medical help at once. Call your local emergency services (911 in [place] Do not drive yourself to the hospital. This information is not intended to replace advice given to you by your health care provider. Make sure you discuss any questions you have with your health care provider. Document Released: 11/07/2004 Document Revised: 03/07/2016 Document Reviewed: 04/02/2013 Elsevier  Interactive Patient Education  2017 Elsevier Inc.     Peripheral Vascular Disease Peripheral vascular disease (PVD) is a disease of the blood vessels that are not part of your heart and brain. A simple term for PVD is poor circulation. In most cases, PVD narrows the blood vessels that carry blood from your heart to the rest of your body. This can result in a decreased supply of blood to your arms, legs, and internal organs, like your stomach or kidneys. However, it most often affects a person's lower legs and feet. There are two types of PVD.  Organic PVD. This is the more common type. It is caused by damage to the structure of blood vessels.  Functional PVD. This is caused by conditions that make blood vessels contract and tighten (spasm). Without treatment, PVD tends to get worse over time. PVD can also lead to acute ischemic limb. This is when an arm or limb suddenly has trouble getting enough blood. This is a medical emergency. Follow these instructions at home:  Take medicines only as told by your doctor.  Do not use any tobacco products, including cigarettes, chewing tobacco, or electronic cigarettes. If you need help quitting, ask your doctor.  Lose weight if you are overweight, and maintain a healthy weight as told by your doctor.  Eat a diet that is low in fat and cholesterol. If you need help, ask your doctor.  Exercise regularly. Ask your doctor for some good activities for you.  Take good care of your feet.  Wear comfortable shoes that fit well.  Check your feet often for any cuts or sores. Contact a doctor if:  You have cramps in your legs while walking.  You have leg pain when you are at rest.  You have coldness in a leg or foot.  Your skin changes.  You are unable to get or have an erection (erectile dysfunction).  You have cuts or sores on your feet that are not healing. Get help right away if:  Your arm or leg turns cold and blue.  Your arms or legs  become red, warm, swollen, painful, or numb.  You have chest pain or trouble breathing.  You suddenly have weakness in your face, arm, or leg.  You become very confused or you cannot speak.  You suddenly have a very bad headache.  You suddenly cannot see. This information is not intended to replace advice given to you by your health care provider. Make sure you discuss any questions you have with your health care provider. Document Released: 12/25/2009 Document Revised: 03/07/2016 Document Reviewed: 03/10/2014 Elsevier Interactive Patient Education  2017 Elsevier Inc.       Steps to Quit Smoking Smoking tobacco can be bad for your health. It can also affect almost every organ in your body. Smoking puts you and people around you at risk for many serious long-lasting (chronic) diseases. Quitting smoking is hard, but it is one of the best things that you can do for your health. It is never too late to quit. What are the benefits of quitting smoking? When you quit smoking, you lower your risk for getting serious   diseases and conditions. They can include:  Lung cancer or lung disease.  Heart disease.  Stroke.  Heart attack.  Not being able to have children (infertility).  Weak bones (osteoporosis) and broken bones (fractures). If you have coughing, wheezing, and shortness of breath, those symptoms may get better when you quit. You may also get sick less often. If you are pregnant, quitting smoking can help to lower your chances of having a baby of low birth weight. What can I do to help me quit smoking? Talk with your doctor about what can help you quit smoking. Some things you can do (strategies) include:  Quitting smoking totally, instead of slowly cutting back how much you smoke over a period of time.  Going to in-person counseling. You are more likely to quit if you go to many counseling sessions.  Using resources and support systems, such as:  Online chats with a  counselor.  Phone quitlines.  Printed self-help materials.  Support groups or group counseling.  Text messaging programs.  Mobile phone apps or applications.  Taking medicines. Some of these medicines may have nicotine in them. If you are pregnant or breastfeeding, do not take any medicines to quit smoking unless your doctor says it is okay. Talk with your doctor about counseling or other things that can help you. Talk with your doctor about using more than one strategy at the same time, such as taking medicines while you are also going to in-person counseling. This can help make quitting easier. What things can I do to make it easier to quit? Quitting smoking might feel very hard at first, but there is a lot that you can do to make it easier. Take these steps:  Talk to your family and friends. Ask them to support and encourage you.  Call phone quitlines, reach out to support groups, or work with a counselor.  Ask people who smoke to not smoke around you.  Avoid places that make you want (trigger) to smoke, such as:  Bars.  Parties.  Smoke-break areas at work.  Spend time with people who do not smoke.  Lower the stress in your life. Stress can make you want to smoke. Try these things to help your stress:  Getting regular exercise.  Deep-breathing exercises.  Yoga.  Meditating.  Doing a body scan. To do this, close your eyes, focus on one area of your body at a time from head to toe, and notice which parts of your body are tense. Try to relax the muscles in those areas.  Download or buy apps on your mobile phone or tablet that can help you stick to your quit plan. There are many free apps, such as QuitGuide from the CDC (Centers for Disease Control and Prevention). You can find more support from smokefree.gov and other websites. This information is not intended to replace advice given to you by your health care provider. Make sure you discuss any questions you have with  your health care provider. Document Released: 07/27/2009 Document Revised: 05/28/2016 Document Reviewed: 02/14/2015 Elsevier Interactive Patient Education  2017 Elsevier Inc.  

## 2016-11-26 NOTE — Progress Notes (Signed)
[facility] [place] HISTORY AND PHYSICAL   MRN : [id number]  History of Present Illness:   [name] is a 71 y.o. female patient of Dr. Kellie Simmering who is status post a left SFA stent May of 2011, she also has a known left ICA occlusion.  She denies claudication symptoms with walking, denies non healing wounds. She denies any known history of stroke or TIA.   She saw her cardiologist early February 2017, Dr. Martinique, for jaw and chest pain. Pt states she then had a stress test and states the results were OK.  Pt Diabetic: Yes, states it is in control Pt smoker: smoker  (1 ppd, started at age 36 yrs)  Pt meds include: Statin :Yes Betablocker: Yes ASA: No, pt states she was advised by Dr. Martinique to take a daily 81 mg ASA Other anticoagulants/antiplatelets: Plavix     Current Outpatient Prescriptions  Medication Sig Dispense Refill  . ABILIFY 5 MG tablet Take 5 mg by mouth daily.     Marland Kitchen amLODipine (NORVASC) 10 MG tablet Take 10 mg by mouth daily.      Marland Kitchen atenolol (TENORMIN) 50 MG tablet Take 50 mg by mouth 2 (two) times daily.     Marland Kitchen atorvastatin (LIPITOR) 20 MG tablet Take 20 mg by mouth daily.      . clopidogrel (PLAVIX) 75 MG tablet Take 1 tablet (75 mg total) by mouth daily. 30 tablet 3  . Cyanocobalamin (VITAMIN B-12 PO) Take by mouth daily.    Marland Kitchen esomeprazole (NEXIUM) 40 MG capsule Take 40 mg by mouth 2 (two) times daily.      . furosemide (LASIX) 20 MG tablet Take 20 mg by mouth daily.  3  . gabapentin (NEURONTIN) 300 MG capsule Take 300 mg by mouth 2 (two) times daily.     Marland Kitchen HYDROcodone-acetaminophen (VICODIN) 5-500 MG per tablet Take 1 tablet by mouth as needed.      . isosorbide mononitrate (IMDUR) 30 MG 24 hr tablet Take 0.5 tablets (15 mg total) by mouth daily. 15 tablet 11  . isosorbide mononitrate (IMDUR) 30 MG 24 hr tablet TAKE 1 TABLET BY MOUTH EVERY DAY 90 tablet 2  . lisinopril (PRINIVIL,ZESTRIL) 40 MG tablet Take 40 mg by mouth daily.      .  methscopolamine (PAMINE FORTE) 5 MG tablet Take 5 mg by mouth at bedtime.      . montelukast (SINGULAIR) 10 MG tablet Take 10 mg by mouth daily.      Marland Kitchen MYRBETRIQ 50 MG TB24 tablet Take 50 mg by mouth daily.  11  . nitroGLYCERIN (NITROSTAT) 0.4 MG SL tablet Place 1 tablet (0.4 mg total) under the tongue every 5 (five) minutes as needed for chest pain. 90 tablet 3  . ondansetron (ZOFRAN) 4 MG/5ML solution Reported on 11/22/2015    . ONETOUCH VERIO test strip     . venlafaxine (EFFEXOR-XR) 150 MG 24 hr capsule Take 150 mg by mouth daily.      Marland Kitchen VICTOZA 18 MG/3ML SOPN Inject 1.8 mg into the skin daily.  4  . ZENPEP 20000 units CPEP TAKE 2 CAPSULES (40,000 UNITS TOTAL) BY MOUTH 3 (THREE) TIMES DAILY. 180 capsule 1   No current facility-administered medications for this visit.     Past Medical History:  Diagnosis Date  . Arthropathy, unspecified, site unspecified   . Blind loop syndrome   . Carotid artery disease ([place])    documented occlusion of left ICA  . Coronary artery  disease   . Depression   . Diabetes insipidus ([place])   . Diabetes mellitus   . Diverticulosis   . Dizziness    chronic  . Dyslipidemia   . Fatty liver   . GERD (gastroesophageal reflux disease)    hiatial hernia  . Hepatomegaly   . Hiatal hernia   . HTN (hypertension)   . Irritable bowel syndrome   . Melanosis coli   . Metatarsal fracture right  . Normal nuclear stress test    nml 01/03/10;06/19/07  . Other, mixed, or unspecified nondependent drug abuse, unspecified   . Pancreatitis, chronic ([place])   . Peripheral vascular disease ([place]) 02/20/10   s/p stent of left SFA  . Thyroid nodule   . Unspecified essential hypertension   . Vitamin B12 deficiency     Social History Social History  Substance Use Topics  . Smoking status: Former Smoker    Packs/day: 1.00    Years: 30.00    Types: Cigarettes    Quit date: 10/14/2010  . Smokeless tobacco: Never Used  . Alcohol use No    Family History Family History   Problem Relation Age of Onset  . Hypertension Mother   . Hypertension Father     aneurysm  . Stroke    . Colon cancer Paternal Grandfather     Surgical History Past Surgical History:  Procedure Laterality Date  . ABDOMINAL HYSTERECTOMY     partial  . BREAST ENHANCEMENT SURGERY    . CARDIAC CATHETERIZATION  07/07/98;08/31/01;03/04/05   60 -70% LAD  . CATARACT EXTRACTION    . EYE SURGERY Left Nov. 2016   Cataract  . EYE SURGERY Right 2001   Cataract  . FEMORAL ARTERY STENT     Left leg  . PATELLA REALIGNMENT Left   . TONSILLECTOMY    . TOTAL ABDOMINAL HYSTERECTOMY    . WRIST SURGERY     right    Allergies  Allergen Reactions  . Codeine   . Iodinated Diagnostic Agents Rash    Current Outpatient Prescriptions  Medication Sig Dispense Refill  . ABILIFY 5 MG tablet Take 5 mg by mouth daily.     Marland Kitchen amLODipine (NORVASC) 10 MG tablet Take 10 mg by mouth daily.      Marland Kitchen atenolol (TENORMIN) 50 MG tablet Take 50 mg by mouth 2 (two) times daily.     Marland Kitchen atorvastatin (LIPITOR) 20 MG tablet Take 20 mg by mouth daily.      . clopidogrel (PLAVIX) 75 MG tablet Take 1 tablet (75 mg total) by mouth daily. 30 tablet 3  . Cyanocobalamin (VITAMIN B-12 PO) Take by mouth daily.    Marland Kitchen esomeprazole (NEXIUM) 40 MG capsule Take 40 mg by mouth 2 (two) times daily.      . furosemide (LASIX) 20 MG tablet Take 20 mg by mouth daily.  3  . gabapentin (NEURONTIN) 300 MG capsule Take 300 mg by mouth 2 (two) times daily.     Marland Kitchen HYDROcodone-acetaminophen (VICODIN) 5-500 MG per tablet Take 1 tablet by mouth as needed.      . isosorbide mononitrate (IMDUR) 30 MG 24 hr tablet Take 0.5 tablets (15 mg total) by mouth daily. 15 tablet 11  . isosorbide mononitrate (IMDUR) 30 MG 24 hr tablet TAKE 1 TABLET BY MOUTH EVERY DAY 90 tablet 2  . lisinopril (PRINIVIL,ZESTRIL) 40 MG tablet Take 40 mg by mouth daily.      . methscopolamine (PAMINE FORTE) 5 MG tablet Take 5 mg by mouth  at bedtime.      . montelukast (SINGULAIR)  10 MG tablet Take 10 mg by mouth daily.      Marland Kitchen MYRBETRIQ 50 MG TB24 tablet Take 50 mg by mouth daily.  11  . nitroGLYCERIN (NITROSTAT) 0.4 MG SL tablet Place 1 tablet (0.4 mg total) under the tongue every 5 (five) minutes as needed for chest pain. 90 tablet 3  . ondansetron (ZOFRAN) 4 MG/5ML solution Reported on 11/22/2015    . ONETOUCH VERIO test strip     . venlafaxine (EFFEXOR-XR) 150 MG 24 hr capsule Take 150 mg by mouth daily.      Marland Kitchen VICTOZA 18 MG/3ML SOPN Inject 1.8 mg into the skin daily.  4  . ZENPEP 20000 units CPEP TAKE 2 CAPSULES (40,000 UNITS TOTAL) BY MOUTH 3 (THREE) TIMES DAILY. 180 capsule 1   No current facility-administered medications for this visit.      REVIEW OF SYSTEMS: See HPI for pertinent positives and negatives.  Physical Examination Vitals:   11/26/16 1220 11/26/16 1221 11/26/16 1227  BP: (!) 152/85 (!) 159/84 (!) 164/82  Pulse: 83    Resp: 16    Temp: 97.5 F (36.4 C)    TempSrc: Oral    SpO2: 97%    Weight: 181 lb (82.1 kg)    Height: 5\' 3"  (1.6 m)     Body mass index is 32.06 kg/m.  General:  WDWN, obese female in NAD Gait: Normal HENT: WNL Eyes: Pupils equal Pulmonary: normal non-labored breathing, no rales, rhonchi, or wheezing, distant breath sounds in all fields, Cardiac: RRR, no murmur detected  Abdomen: soft, NT, no masses palpated Skin: no rashes, no ulcers, no cellulitis.   VASCULAR EXAM  Carotid Bruits Right Left   Negative Negative       Aorta is not palpable Radial pulses: right is 2+ left is 1+ palpable                      VASCULAR EXAM: Extremities without ischemic changes, without Gangrene; without open wounds.                                                                                                                                                       LE Pulses Right Left       FEMORAL  2+ palpable  1+palpable        POPLITEAL  not palpable   1+ palpable       POSTERIOR TIBIAL  2+ palpable   1+  palpable        DORSALIS PEDIS      ANTERIOR TIBIAL 2+ palpable  2+ palpable     Musculoskeletal: no muscle wasting or atrophy; no peripheral edema          Neurologic: A&O X 3; Appropriate Affect ;  SENSATION: normal; MOTOR FUNCTION: 5/5 Symmetric, CN 2-12, Speech is fluent/normal   Significant Diagnostic Studies:  CT head in 2000: no evidence of stroke      ASSESSMENT: [name] is a 71 y.o. female who is status post a left SFA stent May of 2011, she also has a known left ICA occlusion.  She has no known history of stroke or TIA. She has no claudication symptoms with walking, no signs of ischemia in her feet/legs.  Her atherosclerotic risk factors include active smoking, controlled DM, CAD, and obesity. See Plan.    DATA Today's carotid duplex suggests 40-59% stenosis of the right internal carotid artery. Velocities may be elevated due to compensatory flow of contralateral occlusion. Left ICA confirmed occluded.   Significant (>50%) stenosis of the mid left CCA. Bilateral vertebral artery flow is antegrade.  Bilateral subclavian artery waveforms are normal.  No significant change compared to the last exam on 11-22-15.   Today's left LE arterial duplex demonstrates 50-75% stenosis in the left SFA proximal stent. Disease progression compared to 11-22-15; at that time <50% stenosis documented in the left SFA proximal stent.   Today's ABI's: Right: 1.00 (1.05 on 11-22-15), triphasic waveforms; TBI: 0.63 (was 0.69) Left: 0.93 (was 1.03), triphasic waveforms; TBI: 0.74 (was 0.74). Right LE arterial perfusion remains normal; slight decline in the left lower extremity from normal to mild arterial occlusive disease.    PLAN:   The patient was counseled re smoking cessation and given several free resources re smoking cessation.  Graduated walking program discussed.  Based on today's exam and non-invasive vascular lab results, the patient will follow up in 1 year  with the following tests: carotid duplex, ABI's, and left LE arterial duplex. I discussed in depth with the patient the nature of atherosclerosis, and emphasized the importance of maximal medical management including strict control of blood pressure, blood glucose, and lipid levels, obtaining regular exercise, and cessation of smoking.  The patient is aware that without maximal medical management the underlying atherosclerotic disease process will progress, limiting the benefit of any interventions.  The patient was given information about stroke prevention and what symptoms should prompt the patient to seek immediate medical care.  The patient was given information about PAD including signs, symptoms, treatment, what symptoms should prompt the patient to seek immediate medical care, and risk reduction measures to take. Thank you for allowing [place] to participate in this patient's care.  Clemon Chambers, RN, MSN, FNP-C [facility] Office: [phone]  Clinic MD: Early 11/26/2016 12:33 PM

## 2016-11-26 NOTE — Progress Notes (Signed)
Vitals:   11/26/16 1220 11/26/16 1221 11/26/16 1227  BP: (!) 152/85 (!) 159/84 (!) 164/82  Pulse: 83    Resp: 16    Temp: 97.5 F (36.4 C)    TempSrc: Oral    SpO2: 97%    Weight: 181 lb (82.1 kg)    Height: 5\' 3"  (1.6 m)

## 2016-11-28 DIAGNOSIS — G2581 Restless legs syndrome: Secondary | ICD-10-CM | POA: Insufficient documentation

## 2016-12-04 ENCOUNTER — Ambulatory Visit: Payer: Medicare Other | Admitting: Gastroenterology

## 2016-12-05 NOTE — Addendum Note (Signed)
Addended by: Lianne Cure A on: 12/05/2016 11:10 AM   Modules accepted: Orders

## 2016-12-30 ENCOUNTER — Other Ambulatory Visit: Payer: Self-pay | Admitting: Internal Medicine

## 2016-12-30 DIAGNOSIS — R042 Hemoptysis: Secondary | ICD-10-CM

## 2016-12-31 ENCOUNTER — Telehealth: Payer: Self-pay | Admitting: Radiology

## 2016-12-31 NOTE — Telephone Encounter (Signed)
explained 13 hour prep to pt. 13 hour prep called to [facility] [address]

## 2017-01-01 ENCOUNTER — Ambulatory Visit: Payer: Medicare Other | Admitting: Gastroenterology

## 2017-01-02 ENCOUNTER — Ambulatory Visit
Admission: RE | Admit: 2017-01-02 | Discharge: 2017-01-02 | Disposition: A | Discharge status: Home / Self Care | Payer: Medicare Other | Source: Ambulatory Visit | Attending: Internal Medicine | Admitting: Internal Medicine

## 2017-01-02 DIAGNOSIS — R042 Hemoptysis: Secondary | ICD-10-CM

## 2017-01-02 MED ORDER — IOPAMIDOL (ISOVUE-300) INJECTION 61%
75.0000 mL | Freq: Once | INTRAVENOUS | Status: AC | PRN
  Administered 2017-01-02: 75 mL via INTRAVENOUS

## 2017-01-13 ENCOUNTER — Other Ambulatory Visit (INDEPENDENT_AMBULATORY_CARE_PROVIDER_SITE_OTHER): Payer: Medicare Other

## 2017-01-13 ENCOUNTER — Encounter: Payer: Self-pay | Admitting: Gastroenterology

## 2017-01-13 ENCOUNTER — Ambulatory Visit (INDEPENDENT_AMBULATORY_CARE_PROVIDER_SITE_OTHER): Payer: Medicare Other | Admitting: Gastroenterology

## 2017-01-13 VITALS — BP 160/94 | HR 80 | Ht 61.0 in | Wt 188.2 lb

## 2017-01-13 DIAGNOSIS — K8689 Other specified diseases of pancreas: Secondary | ICD-10-CM

## 2017-01-13 DIAGNOSIS — K219 Gastro-esophageal reflux disease without esophagitis: Secondary | ICD-10-CM | POA: Diagnosis not present

## 2017-01-13 DIAGNOSIS — R1011 Right upper quadrant pain: Secondary | ICD-10-CM

## 2017-01-13 LAB — CBC WITH DIFFERENTIAL/PLATELET
Basophils Absolute: 0.1 10*3/uL (ref 0.0–0.1)
Basophils Relative: 0.8 % (ref 0.0–3.0)
EOS PCT: 1.9 % (ref 0.0–5.0)
Eosinophils Absolute: 0.2 10*3/uL (ref 0.0–0.7)
HCT: 42.9 % (ref 36.0–46.0)
Hemoglobin: 14.4 g/dL (ref 12.0–15.0)
LYMPHS ABS: 3.6 10*3/uL (ref 0.7–4.0)
Lymphocytes Relative: 30.7 % (ref 12.0–46.0)
MCHC: 33.5 g/dL (ref 30.0–36.0)
MCV: 91.1 fl (ref 78.0–100.0)
MONO ABS: 0.7 10*3/uL (ref 0.1–1.0)
MONOS PCT: 6 % (ref 3.0–12.0)
NEUTROS ABS: 7.1 10*3/uL (ref 1.4–7.7)
NEUTROS PCT: 60.6 % (ref 43.0–77.0)
PLATELETS: 198 10*3/uL (ref 150.0–400.0)
RBC: 4.71 Mil/uL (ref 3.87–5.11)
RDW: 14.2 % (ref 11.5–15.5)
WBC: 11.7 10*3/uL — ABNORMAL HIGH (ref 4.0–10.5)

## 2017-01-13 LAB — COMPREHENSIVE METABOLIC PANEL
ALBUMIN: 4 g/dL (ref 3.5–5.2)
ALK PHOS: 82 U/L (ref 39–117)
ALT: 15 U/L (ref 0–35)
AST: 15 U/L (ref 0–37)
BUN: 12 mg/dL (ref 6–23)
CALCIUM: 9.3 mg/dL (ref 8.4–10.5)
CO2: 32 mEq/L (ref 19–32)
Chloride: 102 mEq/L (ref 96–112)
Creatinine, Ser: 0.91 mg/dL (ref 0.40–1.20)
GFR: 65.03 mL/min (ref >60.00)
Glucose, Bld: 88 mg/dL (ref 70–99)
Potassium: 4 mEq/L (ref 3.5–5.1)
Sodium: 139 mEq/L (ref 135–145)
TOTAL PROTEIN: 6.5 g/dL (ref 6.0–8.3)
Total Bilirubin: 0.5 mg/dL (ref 0.2–1.2)

## 2017-01-13 LAB — LIPASE: LIPASE: 26 U/L (ref 11.0–59.0)

## 2017-01-13 LAB — TSH: TSH: 2.6 u[IU]/mL (ref 0.35–4.50)

## 2017-01-13 MED ORDER — PANCRELIPASE (LIP-PROT-AMYL) 25000-79000 UNITS PO CPEP: 2.0000 | ORAL_CAPSULE | Freq: Three times a day (TID) | ORAL | 5 refills | Status: DC

## 2017-01-13 MED ORDER — DIPHENHYDRAMINE HCL 50 MG PO TABS: ORAL_TABLET | ORAL | 0 refills | Status: DC

## 2017-01-13 MED ORDER — PREDNISONE 50 MG PO TABS: ORAL_TABLET | ORAL | 0 refills | Status: DC

## 2017-01-13 NOTE — Progress Notes (Signed)
    History of Present Illness: This is a 71 year old female with a history of pancreatic insufficiency, fatty liver, GERD, gastroparesis complaining of diarrhea and abdominal pain. Abdominal pain mainly in RUQ however it generalizes. Bloating generally accompanies the pain. She has not been compliant with pancreatic enzyme supplements and she states she has been taking 1 per day for a while. She recently changed to 1 before meals and has not noted much change in abdominal pain or diarrhea. Abnormal chest CT as below and she relates an appointment with [facility] is upcoming. Denies weight loss, constipation, change in stool caliber, melena, hematochezia, nausea, vomiting, dysphagia, reflux symptoms, chest pain.   RUQ [place] 08/2016 IMPRESSION: No gallstones are noted within gallbladder. Normal CBD. Fatty infiltration of the liver.  Chest CT 01/02/2017 IMPRESSION: 1. Multiple nonspecific tiny nodules in both lungs, all measuring 4 mm or less in maximum diameter each. These could be infectious in nature (e.g. granulomas). Early metastatic disease is unlikely in the absence of known malignancy. No follow-up needed if patient is low-risk (and has no known or suspected primary neoplasm). Non-contrast chest CT can be considered in 12 months if patient is high-risk. This recommendation follows the consensus statement:  Guidelines for Management of Incidental Pulmonary Nodules Detected on CT Images: From the Fleischner Society 2017; Radiology 2017; 284:228-243. 2. Minimal mediastinal and bilateral hilar adenopathy. This could be reactive or neoplastic in nature. 3. Small right pleural effusion. 4. Multiple small thyroid nodules, all measuring 8 mm or less in maximum diameter. These are too small to characterize, but most likely benign in the absence of known clinical risk factors for thyroid carcinoma. 5. Coronary artery and aortic atherosclerosis.  Current Medications, Allergies, Past Medical History, Past  Surgical History, Family History and Social History were reviewed in [employer] record.  Physical Exam: General: Well developed, well nourished, no acute distress Head: Normocephalic and atraumatic Eyes:  sclerae anicteric, EOMI Ears: Normal auditory acuity Mouth: No deformity or lesions Lungs: Clear throughout to auscultation Heart: Regular rate and rhythm; no murmurs, rubs or bruits Abdomen: Soft, mild RUQ tenderness and non distended. No masses, hepatosplenomegaly or hernias noted. Normal Bowel sounds Musculoskeletal: Symmetrical with no gross deformities  Pulses:  Normal pulses noted Extremities: No clubbing, cyanosis, edema or deformities noted Neurological: Alert oriented x 4, grossly nonfocal Psychological:  Alert and cooperative. Normal mood and affect  Assessment and Recommendations:  1. Right upper quadrant pain, generalized abdominal pain and diarrhea. Symptoms likely secondary to inadequately treated pancreatic insufficiency. She has a history of noncompliance with her pancreatic enzyme replacement. I reviewed the disease course and the need for ongoing enzyme replacement before each meal and snack, indefinitely. Increase Zenpep 50,000 units ac and 25,000 units before snacks and further increases could be needed for adequate replacement. Gradual abdominal/pelvic CT. Return office visit 6 weeks.  2. GERD and gastroparesis. Follow all standard antireflux measures and follow standard gastroparesis diet. Continue Nexium 40 mg bid.  3. Abnormal chest CT with multiple small bilateral lung nodules.   4. Fatty liver. Optimal management of diabetes mellitus. Long-term weight loss program with an ADA, low-fat diet.

## 2017-01-13 NOTE — Patient Instructions (Signed)
Your physician has requested that you go to the basement for lab work before leaving today.  We have sent the following medications to your pharmacy for you to pick up at your convenience:  Zenpep 25,000 units to take 2 capsules by mouth before meals and snacks. Prednisone 50 mg to take one tablet by mouth 13 hours, 7 hours and 1 hour prior to CT scan. Benadryl 50 mg one tablet by mouth 1 hour prior to CT scan.  You have been scheduled for a CT scan of the abdomen and pelvis at [facility] ([address]---this is in the same building as [facility]).   You are scheduled on 01/17/17 at 2:00pm. You should arrive 15 minutes prior to your appointment time for registration. Please follow the written instructions below on the day of your exam:  WARNING: IF YOU ARE ALLERGIC TO IODINE/X-RAY DYE, PLEASE NOTIFY RADIOLOGY IMMEDIATELY AT [phone]! YOU WILL BE GIVEN A 13 HOUR PREMEDICATION PREP.  1) Do not eat or drink anything after 10:00am (4 hours prior to your test) 2) You have been given 2 bottles of oral contrast to drink. The solution may taste               better if refrigerated, but do NOT add ice or any other liquid to this solution. Shake             well before drinking.    Drink 1 bottle of contrast @ 12:00pm (2 hours prior to your exam)  Drink 1 bottle of contrast @ 1:00pm (1 hour prior to your exam)  You may take any medications as prescribed with a small amount of water except for the following: Metformin, Glucophage, Glucovance, Avandamet, Riomet, Fortamet, Actoplus Met, Janumet, Glumetza or Metaglip. The above medications must be held the day of the exam AND 48 hours after the exam.  The purpose of you drinking the oral contrast is to aid in the visualization of your intestinal tract. The contrast solution may cause some diarrhea. Before your exam is started, you will be given a small amount of fluid to drink. Depending on your individual set of symptoms, you may  also receive an intravenous injection of x-ray contrast/dye. Plan on being at [facility] for 30 minutes or longer, depending on the type of exam you are having performed.  This test typically takes 30-45 minutes to complete.  If you have any questions regarding your exam or if you need to reschedule, you may call the CT department at [phone] between the hours of 8:00 am and 5:00 pm, Monday-Friday.  ________________________________________________________________________  Normal BMI (Body Mass Index- based on height and weight) is between 23 and 30. Your BMI today is Body mass index is 35.57 kg/m. Marland Kitchen Please consider follow up  regarding your BMI with your Primary Care Provider.  Thank you for choosing me and [facility].  Pricilla Riffle. Dagoberto Ligas., MD., Marval Regal

## 2017-01-17 ENCOUNTER — Ambulatory Visit (INDEPENDENT_AMBULATORY_CARE_PROVIDER_SITE_OTHER)
Admission: RE | Admit: 2017-01-17 | Discharge: 2017-01-17 | Disposition: A | Discharge status: Home / Self Care | Payer: Medicare Other | Source: Ambulatory Visit | Attending: Gastroenterology | Admitting: Gastroenterology

## 2017-01-17 ENCOUNTER — Other Ambulatory Visit: Payer: Medicare Other

## 2017-01-17 DIAGNOSIS — R1011 Right upper quadrant pain: Secondary | ICD-10-CM

## 2017-01-17 MED ORDER — IOPAMIDOL (ISOVUE-300) INJECTION 61%: 100.0000 mL | Freq: Once | INTRAVENOUS | Status: DC | PRN

## 2017-01-31 DIAGNOSIS — R918 Other nonspecific abnormal finding of lung field: Secondary | ICD-10-CM | POA: Insufficient documentation

## 2017-01-31 DIAGNOSIS — I779 Disorder of arteries and arterioles, unspecified: Secondary | ICD-10-CM | POA: Insufficient documentation

## 2017-02-14 ENCOUNTER — Other Ambulatory Visit: Payer: Self-pay | Admitting: Internal Medicine

## 2017-02-14 DIAGNOSIS — Z1231 Encounter for screening mammogram for malignant neoplasm of breast: Secondary | ICD-10-CM

## 2017-02-17 ENCOUNTER — Encounter (INDEPENDENT_AMBULATORY_CARE_PROVIDER_SITE_OTHER): Payer: Self-pay

## 2017-02-17 ENCOUNTER — Encounter: Payer: Self-pay | Admitting: Gastroenterology

## 2017-02-17 ENCOUNTER — Ambulatory Visit (INDEPENDENT_AMBULATORY_CARE_PROVIDER_SITE_OTHER): Payer: Medicare Other | Admitting: Gastroenterology

## 2017-02-17 VITALS — BP 154/92 | HR 72 | Ht 63.0 in | Wt 182.4 lb

## 2017-02-17 DIAGNOSIS — K8681 Exocrine pancreatic insufficiency: Secondary | ICD-10-CM | POA: Diagnosis not present

## 2017-02-17 DIAGNOSIS — R1011 Right upper quadrant pain: Secondary | ICD-10-CM

## 2017-02-17 MED ORDER — DICYCLOMINE HCL 10 MG PO CAPS: 10.0000 mg | ORAL_CAPSULE | Freq: Three times a day (TID) | ORAL | 11 refills | Status: DC

## 2017-02-17 NOTE — Patient Instructions (Signed)
We have sent the following medications to your pharmacy for you to pick up at your convenience:  Bentyl 10 mg to take one capsule by mouth before meals and at bedtime.  Continue your Zenpep 25,000 units to take 2 capsules by mouth before meals and snacks.  Thank you for choosing me and [facility].  Pricilla Riffle. Dagoberto Ligas., MD., Marval Regal

## 2017-02-17 NOTE — Progress Notes (Signed)
    History of Present Illness: This is a 71 year old female returning for right upper quadrant pain and diarrhea. Patient has not increased the dose of ZenPep as we recommended at her last office visit as she felt are recommended dose was "too high". Her abdominal pain and diarrhea have improved. CT results as below.  Abd/pelvic CT 01/17/2017 IMPRESSION: 1. No acute findings or explanation for the patient's symptoms. 2. Probable mild hepatic steatosis. 3. Diffuse aortic and branch vessel atherosclerosis without evidence of large vessel occlusion. 4. Probable pelvic floor laxity.  Current Medications, Allergies, Past Medical History, Past Surgical History, Family History and Social History were reviewed in [employer] record.  Physical Exam: General: Well developed, well nourished, no acute distress Head: Normocephalic and atraumatic Eyes:  sclerae anicteric, EOMI Ears: Normal auditory acuity Mouth: No deformity or lesions Lungs: Decreased breath sounds and end exp wheezes bilaterally Heart: Regular rate and rhythm; no murmurs, rubs or bruits Abdomen: Soft, non tender and non distended. No masses, hepatosplenomegaly or hernias noted. Normal Bowel sounds Musculoskeletal: Symmetrical with no gross deformities  Pulses:  Normal pulses noted Extremities: No clubbing, cyanosis, edema or deformities noted Neurological: Alert oriented x 4, grossly nonfocal Psychological:  Alert and cooperative. Normal mood and affect  Assessment and Recommendations:  1. Pancreatic insufficiency likely leading to right upper quadrant pain and diarrhea. Noncompliance with recommended pancreatic enzyme replacement. Patient is again encouraged to take ZenPep 25,000 lipase units as prescribed: 2 before meals and 1 before snacks. Maintain a low-fat diet. She is reassured that this is not too high of a dose and it the appropriate dose for her.  2. Abnormal CT of the chest with multiple bilateral  small lung nodules. Decreased breath sounds and end expiratory wheezes noted on exam today. Pulmonary consultation is scheduled.  3. Hepatic steatosis. Long-term low fat, carb modified, weight loss diet supervised by PCP.  4. GERD and gastroparesis. Follow standard antireflux measures and a standard gastroparesis diet. Continue Nexium 40 mg twice daily.

## 2017-02-19 ENCOUNTER — Ambulatory Visit (INDEPENDENT_AMBULATORY_CARE_PROVIDER_SITE_OTHER): Payer: Medicare Other | Admitting: Emergency Medicine

## 2017-02-19 ENCOUNTER — Encounter: Payer: Self-pay | Admitting: Emergency Medicine

## 2017-02-19 VITALS — BP 150/92 | HR 84 | Ht 61.0 in | Wt 185.0 lb

## 2017-02-19 DIAGNOSIS — R918 Other nonspecific abnormal finding of lung field: Secondary | ICD-10-CM | POA: Insufficient documentation

## 2017-02-19 DIAGNOSIS — R042 Hemoptysis: Secondary | ICD-10-CM | POA: Insufficient documentation

## 2017-02-19 DIAGNOSIS — Z720 Tobacco use: Secondary | ICD-10-CM | POA: Insufficient documentation

## 2017-02-19 NOTE — Assessment & Plan Note (Signed)
Puts her at risk for COPD, lung cancer. She does have dyspnea. She needs pulmonary function to further evaluate. Will arrange.

## 2017-02-19 NOTE — Assessment & Plan Note (Signed)
Small amount and only on a single occasion. Could follow her clinically but in setting tobacco, abnormal CT chest nad LAD I believe that EBUS / FOB is indicated. Will arrange.

## 2017-02-19 NOTE — Patient Instructions (Addendum)
We will arrange for a bronchoscopy with endobronchial ultrasound to inspect your airways and your lymph nodes We will arrange for pulmonary function testing We will need to repeat your CT scan of the chest in 6 months without contrast to follow your pulmonary nodules.  continue to work hard on stopping smoking.  Follow with Dr Lamonte Sakai next available after your bronchoscopy to discuss.

## 2017-02-19 NOTE — Assessment & Plan Note (Signed)
All are smaller than 4 mm. No target to biopsy at this time. I believe she needs interval change. Next CT scan in 6 months.

## 2017-02-19 NOTE — Progress Notes (Signed)
Subjective:    Patient ID: [name], female    DOB: 10-08-1946, 71 y.o.   MRN: [id number]  HPI 71 year old current every day smoker (12 pk-yrs) with a history of coronary disease, diabetes, hiatal hernia and GERD, pancreatic insufficiency followed by gastroenterology, PVD and carotid disease. She is referred for hemoptysis and an abnormal CT scan chest. She had a single episode of blood mixed in with her clear mucous in mid March. No other sx, no CP, no additional SOB. No epistaxis. She saw her PCP, had a CXR, then CT scan as below. The mucous went back to clear. She is working on decreasing her cigarettes, goal to quit. She has dyspnea w exertion, with taking out garbage, etc.   She tells me that she has had a daily cough for several years, productive of clear phlegm, happens more in the am and in the evenings when she lays flat. Does not wake her from sleep. She is having a lot of nasal gtt, and congestion, uses OTC allergy meds prn.   CT scan Chest 01/02/17 was reviewed by me >> shows several scattered small rounded nodules that all measure below 71mm. R hilar, L hilar enlarged nodes, 55mm AP window node.   Father had COPD, no family members w lung CA.   Review of Systems  Constitutional: Negative for fever and unexpected weight change.  HENT: Positive for sneezing. Negative for congestion, dental problem, ear pain, nosebleeds, postnasal drip, rhinorrhea, sinus pressure, sore throat and trouble swallowing.   Eyes: Negative for redness and itching.  Respiratory: Positive for cough and shortness of breath. Negative for chest tightness and wheezing.   Cardiovascular: Negative for palpitations and leg swelling.  Gastrointestinal: Negative for nausea and vomiting.  Genitourinary: Negative for dysuria.  Musculoskeletal: Negative for joint swelling.  Skin: Negative for rash.  Neurological: Negative for headaches.  Hematological: Does not bruise/bleed easily.  Psychiatric/Behavioral:  Negative for dysphoric mood. The patient is not nervous/anxious.     Past Medical History:  Diagnosis Date  . Arthropathy, unspecified, site unspecified   . Blind loop syndrome   . Carotid artery disease ([place])    documented occlusion of left ICA  . Coronary artery disease   . Depression   . Diabetes insipidus ([place])   . Diabetes mellitus   . Diverticulosis   . Dizziness    chronic  . Dyslipidemia   . Fatty liver   . GERD (gastroesophageal reflux disease)    hiatial hernia  . Hepatomegaly   . Hiatal hernia   . HTN (hypertension)   . Irritable bowel syndrome   . Melanosis coli   . Metatarsal fracture right  . Normal nuclear stress test    nml 01/03/10;06/19/07  . Other, mixed, or unspecified nondependent drug abuse, unspecified   . Pancreatitis, chronic ([place])   . Peripheral vascular disease ([place]) 02/20/10   s/p stent of left SFA  . Thyroid nodule   . Unspecified essential hypertension   . Vitamin B12 deficiency      Family History  Problem Relation Age of Onset  . Hypertension Mother   . Hypertension Father     aneurysm  . Stroke    . Colon cancer Paternal Grandfather      Social History   Social History  . Marital status: Divorced    Spouse name: N/A  . Number of children: 2  . Years of education: N/A   Occupational History  . retired Disabled   Social History Main Topics  .  Smoking status: Current Every Day Smoker    Packs/day: 1.00    Years: 30.00    Types: Cigarettes    Last attempt to quit: 10/14/2010  . Smokeless tobacco: Never Used  . Alcohol use No  . Drug use: No  . Sexual activity: Not on file   Other Topics Concern  . Not on file   Social History Narrative  . No narrative on file     Allergies  Allergen Reactions  . Codeine   . Iodinated Diagnostic Agents Rash     Outpatient Medications Prior to Visit  Medication Sig Dispense Refill  . ABILIFY 5 MG tablet Take 5 mg by mouth daily.     Marland Kitchen amLODipine (NORVASC) 10 MG tablet Take 10 mg  by mouth daily.      Marland Kitchen atenolol (TENORMIN) 50 MG tablet Take 50 mg by mouth 2 (two) times daily.     Marland Kitchen atorvastatin (LIPITOR) 20 MG tablet Take 20 mg by mouth daily.      . clopidogrel (PLAVIX) 75 MG tablet Take 1 tablet (75 mg total) by mouth daily. 30 tablet 3  . Cyanocobalamin (VITAMIN B-12 PO) Take by mouth daily.    Marland Kitchen dicyclomine (BENTYL) 10 MG capsule Take 1 capsule (10 mg total) by mouth 4 (four) times daily -  before meals and at bedtime. 120 capsule 11  . diphenhydrAMINE (BENADRYL) 50 MG tablet Take 1 tablet by mouth 1 hour prior to study 1 tablet 0  . esomeprazole (NEXIUM) 40 MG capsule Take 40 mg by mouth 2 (two) times daily.      . furosemide (LASIX) 20 MG tablet Take 20 mg by mouth daily.  3  . gabapentin (NEURONTIN) 300 MG capsule Take 300 mg by mouth 2 (two) times daily.     Marland Kitchen HYDROcodone-acetaminophen (VICODIN) 5-500 MG per tablet Take 1 tablet by mouth as needed.      . isosorbide mononitrate (IMDUR) 30 MG 24 hr tablet TAKE 1 TABLET BY MOUTH EVERY DAY 90 tablet 2  . lisinopril (PRINIVIL,ZESTRIL) 40 MG tablet Take 40 mg by mouth daily.      . methscopolamine (PAMINE FORTE) 5 MG tablet Take 5 mg by mouth at bedtime.      . montelukast (SINGULAIR) 10 MG tablet Take 10 mg by mouth daily.      Marland Kitchen MYRBETRIQ 50 MG TB24 tablet Take 50 mg by mouth daily.  11  . nitroGLYCERIN (NITROSTAT) 0.4 MG SL tablet Place 1 tablet (0.4 mg total) under the tongue every 5 (five) minutes as needed for chest pain. 90 tablet 3  . ondansetron (ZOFRAN) 4 MG/5ML solution Reported on 11/22/2015    . ONETOUCH VERIO test strip     . Pancrelipase, Lip-Prot-Amyl, (ZENPEP) 25000-79000 units CPEP Take 2 capsules by mouth 3 (three) times daily before meals. And with snacks 300 capsule 5  . venlafaxine (EFFEXOR-XR) 150 MG 24 hr capsule Take 150 mg by mouth daily.      Marland Kitchen VICTOZA 18 MG/3ML SOPN Inject 1.8 mg into the skin daily.  4  . predniSONE (DELTASONE) 50 MG tablet Take one tablet by mouth 13 hours, 7 hours and 1  hour prior to CT scan 3 tablet 0   No facility-administered medications prior to visit.         Objective:   Physical Exam Vitals:   02/19/17 1350  BP: (!) 150/92  Pulse: 84  SpO2: 96%  Weight: 185 lb (83.9 kg)  Height: 5\' 1"  (1.549 m)  Gen: Pleasant, obese woman, well-nourished, in no distress,  normal affect  ENT: No lesions,  mouth clear,  oropharynx clear, no postnasal drip  Neck: No JVD, no TMG, no carotid bruits  Lungs: No use of accessory muscles, clear without rales or rhonchi  Cardiovascular: RRR, heart sounds normal, no murmur or gallops, no peripheral edema  Musculoskeletal: No deformities, no cyanosis or clubbing  Neuro: alert, non focal  Skin: bilateral feet are dusky, cool      Assessment & Plan:  Hemoptysis Small amount and only on a single occasion. Could follow her clinically but in setting tobacco, abnormal CT chest nad LAD I believe that EBUS / FOB is indicated. Will arrange.   Pulmonary nodules/lesions, multiple All are smaller than 4 mm. No target to biopsy at this time. I believe she needs interval change. Next CT scan in 6 months.   Tobacco use Puts her at risk for COPD, lung cancer. She does have dyspnea. She needs pulmonary function to further evaluate. Will arrange.   Baltazar Apo, MD, PhD 02/19/2017, 2:37 PM [facility] [phone] or if no answer [phone]

## 2017-02-20 ENCOUNTER — Encounter (HOSPITAL_COMMUNITY): Payer: Self-pay

## 2017-02-24 ENCOUNTER — Encounter (HOSPITAL_COMMUNITY)
Admission: RE | Disposition: A | Discharge status: Home / Self Care | Payer: Self-pay | Source: Ambulatory Visit | Attending: Emergency Medicine

## 2017-02-24 ENCOUNTER — Ambulatory Visit (HOSPITAL_COMMUNITY): Payer: Medicare Other

## 2017-02-24 ENCOUNTER — Encounter (HOSPITAL_COMMUNITY): Payer: Self-pay | Admitting: *Deleted

## 2017-02-24 ENCOUNTER — Ambulatory Visit (HOSPITAL_COMMUNITY): Payer: Medicare Other | Admitting: Registered Nurse

## 2017-02-24 ENCOUNTER — Ambulatory Visit (HOSPITAL_COMMUNITY)
Admission: RE | Admit: 2017-02-24 | Discharge: 2017-02-24 | Disposition: A | Discharge status: Home / Self Care | Payer: Medicare Other | Source: Ambulatory Visit | Attending: Emergency Medicine | Admitting: Emergency Medicine

## 2017-02-24 DIAGNOSIS — I10 Essential (primary) hypertension: Secondary | ICD-10-CM | POA: Diagnosis not present

## 2017-02-24 DIAGNOSIS — I739 Peripheral vascular disease, unspecified: Secondary | ICD-10-CM

## 2017-02-24 DIAGNOSIS — K861 Other chronic pancreatitis: Secondary | ICD-10-CM | POA: Insufficient documentation

## 2017-02-24 DIAGNOSIS — R918 Other nonspecific abnormal finding of lung field: Secondary | ICD-10-CM | POA: Diagnosis not present

## 2017-02-24 DIAGNOSIS — R042 Hemoptysis: Secondary | ICD-10-CM | POA: Insufficient documentation

## 2017-02-24 DIAGNOSIS — K589 Irritable bowel syndrome without diarrhea: Secondary | ICD-10-CM | POA: Insufficient documentation

## 2017-02-24 DIAGNOSIS — F329 Major depressive disorder, single episode, unspecified: Secondary | ICD-10-CM | POA: Diagnosis not present

## 2017-02-24 DIAGNOSIS — Z79899 Other long term (current) drug therapy: Secondary | ICD-10-CM | POA: Diagnosis not present

## 2017-02-24 DIAGNOSIS — R1011 Right upper quadrant pain: Secondary | ICD-10-CM

## 2017-02-24 DIAGNOSIS — E538 Deficiency of other specified B group vitamins: Secondary | ICD-10-CM | POA: Insufficient documentation

## 2017-02-24 DIAGNOSIS — I2510 Atherosclerotic heart disease of native coronary artery without angina pectoris: Secondary | ICD-10-CM | POA: Diagnosis not present

## 2017-02-24 DIAGNOSIS — E785 Hyperlipidemia, unspecified: Secondary | ICD-10-CM | POA: Diagnosis not present

## 2017-02-24 DIAGNOSIS — F17210 Nicotine dependence, cigarettes, uncomplicated: Secondary | ICD-10-CM | POA: Diagnosis not present

## 2017-02-24 DIAGNOSIS — R590 Localized enlarged lymph nodes: Secondary | ICD-10-CM | POA: Diagnosis not present

## 2017-02-24 DIAGNOSIS — Z7982 Long term (current) use of aspirin: Secondary | ICD-10-CM | POA: Insufficient documentation

## 2017-02-24 DIAGNOSIS — K219 Gastro-esophageal reflux disease without esophagitis: Secondary | ICD-10-CM | POA: Diagnosis not present

## 2017-02-24 DIAGNOSIS — K8689 Other specified diseases of pancreas: Secondary | ICD-10-CM

## 2017-02-24 DIAGNOSIS — E1151 Type 2 diabetes mellitus with diabetic peripheral angiopathy without gangrene: Secondary | ICD-10-CM | POA: Diagnosis not present

## 2017-02-24 DIAGNOSIS — Z7984 Long term (current) use of oral hypoglycemic drugs: Secondary | ICD-10-CM | POA: Insufficient documentation

## 2017-02-24 DIAGNOSIS — Z7902 Long term (current) use of antithrombotics/antiplatelets: Secondary | ICD-10-CM | POA: Insufficient documentation

## 2017-02-24 HISTORY — DX: Sleep apnea, unspecified: G47.30

## 2017-02-24 HISTORY — PX: ENDOBRONCHIAL ULTRASOUND: SHX5096

## 2017-02-24 HISTORY — DX: Anxiety disorder, unspecified: F41.9

## 2017-02-24 HISTORY — DX: Dyspnea, unspecified: R06.00

## 2017-02-24 LAB — BODY FLUID CELL COUNT WITH DIFFERENTIAL
Eos, Fluid: 2 %
LYMPHS FL: 26 %
MONOCYTE-MACROPHAGE-SEROUS FLUID: 48 % — AB (ref 50–90)
Neutrophil Count, Fluid: 24 % (ref 0–25)
Total Nucleated Cell Count, Fluid: 140 cu mm (ref 0–1000)

## 2017-02-24 LAB — POCT I-STAT 4, (NA,K, GLUC, HGB,HCT)
Glucose, Bld: 108 mg/dL — ABNORMAL HIGH (ref 65–99)
HEMATOCRIT: 41 % (ref 36.0–46.0)
Hemoglobin: 13.9 g/dL (ref 12.0–15.0)
Potassium: 4 mmol/L (ref 3.5–5.1)
SODIUM: 139 mmol/L (ref 135–145)

## 2017-02-24 SURGERY — ENDOBRONCHIAL ULTRASOUND (EBUS)
Anesthesia: General | Laterality: Bilateral

## 2017-02-24 MED ORDER — LIDOCAINE 2% (20 MG/ML) 5 ML SYRINGE
INTRAMUSCULAR | Status: DC | PRN
  Administered 2017-02-24: 100 mg via INTRAVENOUS

## 2017-02-24 MED ORDER — PROPOFOL 10 MG/ML IV BOLUS
INTRAVENOUS | Status: DC | PRN
  Administered 2017-02-24: 170 mg via INTRAVENOUS

## 2017-02-24 MED ORDER — ROCURONIUM BROMIDE 50 MG/5ML IV SOSY
PREFILLED_SYRINGE | INTRAVENOUS | Status: AC
  Filled 2017-02-24: qty 5

## 2017-02-24 MED ORDER — ROCURONIUM BROMIDE 10 MG/ML (PF) SYRINGE
PREFILLED_SYRINGE | INTRAVENOUS | Status: DC | PRN
  Administered 2017-02-24: 30 mg via INTRAVENOUS

## 2017-02-24 MED ORDER — SUGAMMADEX SODIUM 200 MG/2ML IV SOLN
INTRAVENOUS | Status: AC
  Filled 2017-02-24: qty 2

## 2017-02-24 MED ORDER — ONDANSETRON HCL 4 MG/2ML IJ SOLN
INTRAMUSCULAR | Status: DC | PRN
  Administered 2017-02-24: 4 mg via INTRAVENOUS

## 2017-02-24 MED ORDER — DEXAMETHASONE SODIUM PHOSPHATE 10 MG/ML IJ SOLN
INTRAMUSCULAR | Status: AC
  Filled 2017-02-24: qty 1

## 2017-02-24 MED ORDER — SUCCINYLCHOLINE CHLORIDE 200 MG/10ML IV SOSY
PREFILLED_SYRINGE | INTRAVENOUS | Status: AC
  Filled 2017-02-24: qty 10

## 2017-02-24 MED ORDER — SUGAMMADEX SODIUM 200 MG/2ML IV SOLN
INTRAVENOUS | Status: DC | PRN
  Administered 2017-02-24: 160 mg via INTRAVENOUS

## 2017-02-24 MED ORDER — DEXAMETHASONE SODIUM PHOSPHATE 10 MG/ML IJ SOLN
INTRAMUSCULAR | Status: DC | PRN
  Administered 2017-02-24: 10 mg via INTRAVENOUS

## 2017-02-24 MED ORDER — GLYCOPYRROLATE 0.2 MG/ML IV SOSY
PREFILLED_SYRINGE | INTRAVENOUS | Status: DC | PRN
  Administered 2017-02-24: .2 mg via INTRAVENOUS

## 2017-02-24 MED ORDER — LIDOCAINE 2% (20 MG/ML) 5 ML SYRINGE
INTRAMUSCULAR | Status: AC
  Filled 2017-02-24: qty 5

## 2017-02-24 MED ORDER — CLOPIDOGREL BISULFATE 75 MG PO TABS: 75.0000 mg | ORAL_TABLET | Freq: Every day | ORAL | 3 refills | Status: DC

## 2017-02-24 MED ORDER — EPHEDRINE 5 MG/ML INJ
INTRAVENOUS | Status: AC
  Filled 2017-02-24: qty 10

## 2017-02-24 MED ORDER — DIPHENHYDRAMINE HCL 50 MG PO TABS: 50.0000 mg | ORAL_TABLET | Freq: Every day | ORAL | Status: DC | PRN

## 2017-02-24 MED ORDER — PROPOFOL 10 MG/ML IV BOLUS
INTRAVENOUS | Status: AC
  Filled 2017-02-24: qty 20

## 2017-02-24 MED ORDER — FENTANYL CITRATE (PF) 100 MCG/2ML IJ SOLN
INTRAMUSCULAR | Status: AC
  Filled 2017-02-24: qty 2

## 2017-02-24 MED ORDER — GLYCOPYRROLATE 0.2 MG/ML IV SOSY
PREFILLED_SYRINGE | INTRAVENOUS | Status: AC
  Filled 2017-02-24: qty 5

## 2017-02-24 MED ORDER — FENTANYL CITRATE (PF) 100 MCG/2ML IJ SOLN
INTRAMUSCULAR | Status: DC | PRN
  Administered 2017-02-24: 50 ug via INTRAVENOUS

## 2017-02-24 MED ORDER — ONDANSETRON HCL 4 MG/2ML IJ SOLN
INTRAMUSCULAR | Status: AC
  Filled 2017-02-24: qty 2

## 2017-02-24 MED ORDER — MIDAZOLAM HCL 2 MG/2ML IJ SOLN
INTRAMUSCULAR | Status: AC
  Filled 2017-02-24: qty 2

## 2017-02-24 MED ORDER — MIDAZOLAM HCL 5 MG/5ML IJ SOLN
INTRAMUSCULAR | Status: DC | PRN
  Administered 2017-02-24: 2 mg via INTRAVENOUS

## 2017-02-24 MED ORDER — SUCCINYLCHOLINE CHLORIDE 200 MG/10ML IV SOSY
PREFILLED_SYRINGE | INTRAVENOUS | Status: DC | PRN
  Administered 2017-02-24: 100 mg via INTRAVENOUS

## 2017-02-24 MED ORDER — EPHEDRINE SULFATE-NACL 50-0.9 MG/10ML-% IV SOSY
PREFILLED_SYRINGE | INTRAVENOUS | Status: DC | PRN
  Administered 2017-02-24 (×2): 10 mg via INTRAVENOUS
  Administered 2017-02-24: 5 mg via INTRAVENOUS

## 2017-02-24 MED ORDER — PHENYLEPHRINE 40 MCG/ML (10ML) SYRINGE FOR IV PUSH (FOR BLOOD PRESSURE SUPPORT)
PREFILLED_SYRINGE | INTRAVENOUS | Status: AC
  Filled 2017-02-24: qty 10

## 2017-02-24 MED ORDER — LACTATED RINGERS IV SOLN
INTRAVENOUS | Status: DC | PRN
  Administered 2017-02-24: 12:00:00 via INTRAVENOUS

## 2017-02-24 MED ORDER — PHENYLEPHRINE HCL 10 MG/ML IJ SOLN
INTRAMUSCULAR | Status: DC | PRN
  Administered 2017-02-24: 40 ug via INTRAVENOUS
  Administered 2017-02-24: 80 ug via INTRAVENOUS

## 2017-02-24 NOTE — Anesthesia Postprocedure Evaluation (Signed)
Anesthesia Post Note  Patient: NORAA PICKERAL  Procedure(s) Performed: Procedure(s) (LRB): ENDOBRONCHIAL ULTRASOUND (Bilateral)  Patient location during evaluation: PACU Anesthesia Type: General Level of consciousness: awake and alert Pain management: pain level controlled Vital Signs Assessment: post-procedure vital signs reviewed and stable Respiratory status: spontaneous breathing, nonlabored ventilation, respiratory function stable and patient connected to nasal cannula oxygen Cardiovascular status: blood pressure returned to baseline and stable Postop Assessment: no signs of nausea or vomiting Anesthetic complications: no       Last Vitals:  Vitals:   02/24/17 1410 02/24/17 1420  BP: (!) 148/56 134/63  Pulse: 82 80  Resp: 16 15  Temp:      Last Pain:  Vitals:   02/24/17 1403  TempSrc: Oral                 Ryan P Ellender

## 2017-02-24 NOTE — Interval H&P Note (Signed)
PCCM Interval Note  Pt presents today for further eval of her mediastinal and hilar LAD, small pulmonary nodules and her hemoptysis. She has been feeling well since last visit, has seen no further hemoptysis. She last took her plavix on 02/21/17. She understands the procedure, risks, benefits.   Vitals:   02/24/17 1207  BP: (!) 170/91  Pulse: 71  Resp: 16  Temp: 98.9 F (37.2 C)  TempSrc: Oral  SpO2: 96%  Weight: 81.6 kg (180 lb)  Height: _0  (1.6 m)   Gen: Pleasant, obese woman, well-nourished, in no distress,  normal affect  ENT: No lesions,  mouth clear,  oropharynx clear, no postnasal drip  Neck: No JVD, no stridor,   Lungs: No use of accessory muscles, clear without rales or rhonchi, distant at bases  Cardiovascular: RRR, heart sounds normal, no murmur or gallops, no peripheral edema  Musculoskeletal: No deformities, no cyanosis or clubbing  Neuro: alert, non focal  Skin: Warm, no lesions or rashes   Impression:  Scant hemoptysis, appears to be resolved Mediastinal and hilar LAD Bilateral small pulmonary nodules, all < 82m  Plans:  FOB w EBUS to localize and sample LAD Airway inspection BAL for cx and cytology.   RBaltazar Apo MD, PhD 02/24/2017, 12:37 PM [facility] [phone] if no answer [phone]

## 2017-02-24 NOTE — Anesthesia Procedure Notes (Signed)
Procedure Name: Intubation Date/Time: 02/24/2017 12:51 PM Performed by: Carleene Cooper A Pre-anesthesia Checklist: Patient identified, Emergency Drugs available, Suction available, Patient being monitored and Timeout performed Patient Re-evaluated:Patient Re-evaluated prior to inductionOxygen Delivery Method: Circle system utilized Preoxygenation: Pre-oxygenation with 100% oxygen Intubation Type: IV induction Ventilation: Mask ventilation without difficulty Laryngoscope Size: 4 and Mac Grade View: Grade II Tube type: Oral Tube size: 8.5 mm Number of attempts: 1 Airway Equipment and Method: Stylet Placement Confirmation: ETT inserted through vocal cords under direct vision,  positive ETCO2 and breath sounds checked- equal and bilateral Secured at: 21 cm Tube secured with: Tape Dental Injury: Teeth and Oropharynx as per pre-operative assessment

## 2017-02-24 NOTE — Anesthesia Preprocedure Evaluation (Addendum)
Anesthesia Evaluation  Patient identified by MRN, date of birth, ID band Patient awake    Reviewed: Allergy & Precautions, NPO status , Patient's Chart, lab work & pertinent test results  Airway Mallampati: IV  TM Distance: >3 FB Neck ROM: Full    Dental no notable dental hx.    Pulmonary neg pulmonary ROS, sleep apnea , Current Smoker,    Pulmonary exam normal breath sounds clear to auscultation       Cardiovascular hypertension, Pt. on medications and Pt. on home beta blockers + angina (stable) with exertion + CAD and + Peripheral Vascular Disease  Normal cardiovascular exam Rhythm:Regular Rate:Normal     Neuro/Psych Depression negative neurological ROS  negative psych ROS   GI/Hepatic negative GI ROS, Neg liver ROS, hiatal hernia, GERD  Medicated and Controlled,  Endo/Other  negative endocrine ROSdiabetes  Renal/GU negative Renal ROS  negative genitourinary   Musculoskeletal negative musculoskeletal ROS (+)   Abdominal   Peds negative pediatric ROS (+)  Hematology negative hematology ROS (+)   Anesthesia Other Findings obese  Reproductive/Obstetrics negative OB ROS                           Anesthesia Physical Anesthesia Plan  ASA: IV  Anesthesia Plan: General   Post-op Pain Management:    Induction: Intravenous  Airway Management Planned: Oral ETT  Additional Equipment:   Intra-op Plan:   Post-operative Plan: Extubation in OR  Informed Consent: I have reviewed the patients History and Physical, chart, labs and discussed the procedure including the risks, benefits and alternatives for the proposed anesthesia with the patient or authorized representative who has indicated his/her understanding and acceptance.   Dental advisory given  Plan Discussed with: CRNA and Surgeon  Anesthesia Plan Comments:        Anesthesia Quick Evaluation

## 2017-02-24 NOTE — Op Note (Signed)
[facility] Cardiopulmonary Patient Name: [name] Procedure Date: 02/24/2017 MRN: [id number] Attending MD: Collene Gobble , MD Date of Birth: 01-28-1946 CSN: [id number] Age: 71 Admit Type: Outpatient Ethnicity: Not Hispanic or Latino Procedure:            Bronchoscopy Indications:          Hemoptysis with abnormal CXR, Bilateral hilar                        lymphadenopathy, Mediastinal adenopathy Providers:            Collene Gobble, MD, Elmer Ramp. Tilden Dome, RN, William Dalton, Technician, Ashley Mariner RRT,RCP Referring MD:          Medicines:            General Anesthesia Complications:        No immediate complications Estimated Blood Loss: Estimated blood loss was minimal. Procedure:      Pre-Anesthesia Assessment:      - A History and Physical has been performed. Patient meds and allergies       have been reviewed. The risks and benefits of the procedure and the       sedation options and risks were discussed with the patient. All       questions were answered and informed consent was obtained. Patient       identification and proposed procedure were verified prior to the       procedure by the physician in the pre-procedure area. Mental Status       Examination: normal. Airway Examination: normal oropharyngeal airway.       Respiratory Examination: clear to auscultation. CV Examination: RRR, no       murmurs, no S3 or S4. ASA Grade Assessment: II - A patient with mild       systemic disease. After reviewing the risks and benefits, the patient       was deemed in satisfactory condition to undergo the procedure. The       anesthesia plan was to use deep sedation / analgesia. Immediately prior       to administration of medications, the patient was re-assessed for       adequacy to receive sedatives. The heart rate, respiratory rate, oxygen       saturations, blood pressure, adequacy of pulmonary ventilation, and       response to care  were monitored throughout the procedure. The physical       status of the patient was re-assessed after the procedure.      After obtaining informed consent, the bronchoscope was passed under       direct vision. Throughout the procedure, the patient's blood pressure,       pulse, and oxygen saturations were monitored continuously. the TI-[id number]       ([id number]) scope was introduced through the mouth, via the endotracheal       tube and advanced to the tracheobronchial tree. The procedure was       accomplished without difficulty. The patient tolerated the procedure       well. Findings:      The endotracheal tube is in good position. The trachea is of normal       caliber. The carina is sharp. The tracheobronchial tree was examined to  at least the first subsegmental level. Bronchial mucosa and anatomy are       normal; there are no endobronchial lesions, and no secretions.      Endobronchial ultrasound was used assist with guiding the biopsy needle       in the subcarinal area, in the right hilum and in the left hilum.       samples were obtained from slightly enlarged (~1cm) station 7, 10R and       10L nodes.      Bronchoalveolar lavage was performed in the RUL anterior segment (B3) of       the lung and sent for cell count, bacterial culture, viral smears &       culture, and fungal & AFB analysis and cytology. 60 mL of fluid were       instilled. 25 mL were returned. The return was blood-tinged. There were       no mucoid plugs in the return fluid. Impression:      - Hemoptysis with abnormal CXR      - Bilateral hilar lymphadenopathy      - Mediastinal adenopathy      - The examination was normal.      - Endobronchial ultrasound was performed.      - Wang Needle biopsies from nodes 7, 10R and 10L. Moderate Sedation:      General anesthesia Recommendation:      - Await cytology results. Procedure Code(s):      --- Professional ---      [phone], Bronchoscopy, rigid or  flexible, including fluoroscopic guidance,       when performed; diagnostic, with cell washing, when performed (separate       procedure)      [phone], Bronchoscopy, rigid or flexible, including fluoroscopic guidance,       when performed; with transendoscopic endobronchial ultrasound (EBUS)       during bronchoscopic diagnostic or therapeutic intervention(s) for       peripheral lesion(s) (List separately in addition to code for primary       procedure[s]) Diagnosis Code(s):      --- Professional ---      R04.2, Hemoptysis      R91.8, Other nonspecific abnormal finding of lung field      R59.9, Enlarged lymph nodes, unspecified CPT copyright 2016 American Medical Association. All rights reserved. The codes documented in this report are preliminary and upon coder review may  be revised to meet current compliance requirements. Collene Gobble, MD Collene Gobble, MD 02/24/2017 1:58:57 PM Number of Addenda: 0 Scope In: Scope Out:

## 2017-02-24 NOTE — Transfer of Care (Signed)
Immediate Anesthesia Transfer of Care Note  Patient: [name]  Procedure(s) Performed: Procedure(s): ENDOBRONCHIAL ULTRASOUND (Bilateral)  Patient Location: PACU and Endoscopy Unit  Anesthesia Type:General  Level of Consciousness: awake, alert , oriented and patient cooperative  Airway & Oxygen Therapy: Patient Spontanous Breathing and Patient connected to face mask oxygen  Post-op Assessment: Report given to RN, Post -op Vital signs reviewed and stable and Patient moving all extremities  Post vital signs: Reviewed and stable  Last Vitals:  Vitals:   02/24/17 1207  BP: (!) 170/91  Pulse: 71  Resp: 16  Temp: 37.2 C    Last Pain:  Vitals:   02/24/17 1207  TempSrc: Oral         Complications: No apparent anesthesia complications

## 2017-02-24 NOTE — Discharge Instructions (Signed)
Flexible Bronchoscopy, Care After These instructions give you information on caring for yourself after your procedure. Your doctor may also give you more specific instructions. Call your doctor if you have any problems or questions after your procedure. Follow these instructions at home:  Do not eat or drink anything for 2 hours after your procedure. If you try to eat or drink before the medicine wears off, food or drink could go into your lungs. You could also burn yourself.  After 2 hours have passed and when you can cough and gag normally, you may eat soft food and drink liquids slowly.  The day after the test, you may eat your normal diet.  You may do your normal activities.  Keep all doctor visits. Get help right away if:  You get more and more short of breath.  You get light-headed.  You feel like you are going to pass out (faint).  You have chest pain.  You have new problems that worry you.  You cough up more than a little blood.  You cough up more blood than before. This information is not intended to replace advice given to you by your health care provider. Make sure you discuss any questions you have with your health care provider. Document Released: 07/28/2009 Document Revised: 03/07/2016 Document Reviewed: 06/04/2013 Elsevier Interactive Patient Education  2017 [place] ANY QUESTIONS OR CONCERNS. [phone].

## 2017-02-24 NOTE — H&P (View-Only) (Signed)
Subjective:    Patient ID: [name], female    DOB: 22-Oct-1945, 71 y.o.   MRN: [id number]  HPI 71 year old current every day smoker (18 pk-yrs) with a history of coronary disease, diabetes, hiatal hernia and GERD, pancreatic insufficiency followed by gastroenterology, PVD and carotid disease. She is referred for hemoptysis and an abnormal CT scan chest. She had a single episode of blood mixed in with her clear mucous in mid March. No other sx, no CP, no additional SOB. No epistaxis. She saw her PCP, had a CXR, then CT scan as below. The mucous went back to clear. She is working on decreasing her cigarettes, goal to quit. She has dyspnea w exertion, with taking out garbage, etc.   She tells me that she has had a daily cough for several years, productive of clear phlegm, happens more in the am and in the evenings when she lays flat. Does not wake her from sleep. She is having a lot of nasal gtt, and congestion, uses OTC allergy meds prn.   CT scan Chest 01/02/17 was reviewed by me >> shows several scattered small rounded nodules that all measure below 30mm. R hilar, L hilar enlarged nodes, 58mm AP window node.   Father had COPD, no family members w lung CA.   Review of Systems  Constitutional: Negative for fever and unexpected weight change.  HENT: Positive for sneezing. Negative for congestion, dental problem, ear pain, nosebleeds, postnasal drip, rhinorrhea, sinus pressure, sore throat and trouble swallowing.   Eyes: Negative for redness and itching.  Respiratory: Positive for cough and shortness of breath. Negative for chest tightness and wheezing.   Cardiovascular: Negative for palpitations and leg swelling.  Gastrointestinal: Negative for nausea and vomiting.  Genitourinary: Negative for dysuria.  Musculoskeletal: Negative for joint swelling.  Skin: Negative for rash.  Neurological: Negative for headaches.  Hematological: Does not bruise/bleed easily.  Psychiatric/Behavioral:  Negative for dysphoric mood. The patient is not nervous/anxious.     Past Medical History:  Diagnosis Date  . Arthropathy, unspecified, site unspecified   . Blind loop syndrome   . Carotid artery disease ([place])    documented occlusion of left ICA  . Coronary artery disease   . Depression   . Diabetes insipidus ([place])   . Diabetes mellitus   . Diverticulosis   . Dizziness    chronic  . Dyslipidemia   . Fatty liver   . GERD (gastroesophageal reflux disease)    hiatial hernia  . Hepatomegaly   . Hiatal hernia   . HTN (hypertension)   . Irritable bowel syndrome   . Melanosis coli   . Metatarsal fracture right  . Normal nuclear stress test    nml 01/03/10;06/19/07  . Other, mixed, or unspecified nondependent drug abuse, unspecified   . Pancreatitis, chronic ([place])   . Peripheral vascular disease ([place]) 02/20/10   s/p stent of left SFA  . Thyroid nodule   . Unspecified essential hypertension   . Vitamin B12 deficiency      Family History  Problem Relation Age of Onset  . Hypertension Mother   . Hypertension Father     aneurysm  . Stroke    . Colon cancer Paternal Grandfather      Social History   Social History  . Marital status: Divorced    Spouse name: N/A  . Number of children: 2  . Years of education: N/A   Occupational History  . retired Disabled   Social History Main Topics  .  Smoking status: Current Every Day Smoker    Packs/day: 1.00    Years: 30.00    Types: Cigarettes    Last attempt to quit: 10/14/2010  . Smokeless tobacco: Never Used  . Alcohol use No  . Drug use: No  . Sexual activity: Not on file   Other Topics Concern  . Not on file   Social History Narrative  . No narrative on file     Allergies  Allergen Reactions  . Codeine   . Iodinated Diagnostic Agents Rash     Outpatient Medications Prior to Visit  Medication Sig Dispense Refill  . ABILIFY 5 MG tablet Take 5 mg by mouth daily.     Marland Kitchen amLODipine (NORVASC) 10 MG tablet Take 10 mg  by mouth daily.      Marland Kitchen atenolol (TENORMIN) 50 MG tablet Take 50 mg by mouth 2 (two) times daily.     Marland Kitchen atorvastatin (LIPITOR) 20 MG tablet Take 20 mg by mouth daily.      . clopidogrel (PLAVIX) 75 MG tablet Take 1 tablet (75 mg total) by mouth daily. 30 tablet 3  . Cyanocobalamin (VITAMIN B-12 PO) Take by mouth daily.    Marland Kitchen dicyclomine (BENTYL) 10 MG capsule Take 1 capsule (10 mg total) by mouth 4 (four) times daily -  before meals and at bedtime. 120 capsule 11  . diphenhydrAMINE (BENADRYL) 50 MG tablet Take 1 tablet by mouth 1 hour prior to study 1 tablet 0  . esomeprazole (NEXIUM) 40 MG capsule Take 40 mg by mouth 2 (two) times daily.      . furosemide (LASIX) 20 MG tablet Take 20 mg by mouth daily.  3  . gabapentin (NEURONTIN) 300 MG capsule Take 300 mg by mouth 2 (two) times daily.     Marland Kitchen HYDROcodone-acetaminophen (VICODIN) 5-500 MG per tablet Take 1 tablet by mouth as needed.      . isosorbide mononitrate (IMDUR) 30 MG 24 hr tablet TAKE 1 TABLET BY MOUTH EVERY DAY 90 tablet 2  . lisinopril (PRINIVIL,ZESTRIL) 40 MG tablet Take 40 mg by mouth daily.      . methscopolamine (PAMINE FORTE) 5 MG tablet Take 5 mg by mouth at bedtime.      . montelukast (SINGULAIR) 10 MG tablet Take 10 mg by mouth daily.      Marland Kitchen MYRBETRIQ 50 MG TB24 tablet Take 50 mg by mouth daily.  11  . nitroGLYCERIN (NITROSTAT) 0.4 MG SL tablet Place 1 tablet (0.4 mg total) under the tongue every 5 (five) minutes as needed for chest pain. 90 tablet 3  . ondansetron (ZOFRAN) 4 MG/5ML solution Reported on 11/22/2015    . ONETOUCH VERIO test strip     . Pancrelipase, Lip-Prot-Amyl, (ZENPEP) 25000-79000 units CPEP Take 2 capsules by mouth 3 (three) times daily before meals. And with snacks 300 capsule 5  . venlafaxine (EFFEXOR-XR) 150 MG 24 hr capsule Take 150 mg by mouth daily.      Marland Kitchen VICTOZA 18 MG/3ML SOPN Inject 1.8 mg into the skin daily.  4  . predniSONE (DELTASONE) 50 MG tablet Take one tablet by mouth 13 hours, 7 hours and 1  hour prior to CT scan 3 tablet 0   No facility-administered medications prior to visit.         Objective:   Physical Exam Vitals:   02/19/17 1350  BP: (!) 150/92  Pulse: 84  SpO2: 96%  Weight: 185 lb (83.9 kg)  Height: 5\' 1"  (1.549 m)  Gen: Pleasant, obese woman, well-nourished, in no distress,  normal affect  ENT: No lesions,  mouth clear,  oropharynx clear, no postnasal drip  Neck: No JVD, no TMG, no carotid bruits  Lungs: No use of accessory muscles, clear without rales or rhonchi  Cardiovascular: RRR, heart sounds normal, no murmur or gallops, no peripheral edema  Musculoskeletal: No deformities, no cyanosis or clubbing  Neuro: alert, non focal  Skin: bilateral feet are dusky, cool      Assessment & Plan:  Hemoptysis Small amount and only on a single occasion. Could follow her clinically but in setting tobacco, abnormal CT chest nad LAD I believe that EBUS / FOB is indicated. Will arrange.   Pulmonary nodules/lesions, multiple All are smaller than 4 mm. No target to biopsy at this time. I believe she needs interval change. Next CT scan in 6 months.   Tobacco use Puts her at risk for COPD, lung cancer. She does have dyspnea. She needs pulmonary function to further evaluate. Will arrange.   Baltazar Apo, MD, PhD 02/19/2017, 2:37 PM [facility] [phone] or if no answer [phone]

## 2017-02-25 ENCOUNTER — Encounter (HOSPITAL_COMMUNITY): Payer: Self-pay | Admitting: Emergency Medicine

## 2017-02-25 LAB — ACID FAST SMEAR (AFB): ACID FAST SMEAR - AFSCU2: NEGATIVE

## 2017-02-25 LAB — ACID FAST SMEAR (AFB, MYCOBACTERIA)

## 2017-02-26 LAB — CULTURE, BAL-QUANTITATIVE

## 2017-02-26 LAB — CULTURE, BAL-QUANTITATIVE W GRAM STAIN

## 2017-02-27 ENCOUNTER — Telehealth: Payer: Self-pay | Admitting: Acute Care

## 2017-02-27 NOTE — Telephone Encounter (Signed)
I have called patient with the results of her EBUS. I explained that cytology and  biopsy did not indicate malignancy. I explained that she would need to follow up with Dr. Lamonte Sakai to determine any further plan of care.I explained that BAL was positive for STREPTOCOCCUS PNEUMONIAE  . She states she has cough productive for yellow to tan thick secretions. Please send in prescription for Levaquin 500 mg once daily x 5 days. Have her follow up with NP ( 4 weeks) to ensure improvement,  and with  Dr. Lamonte Sakai at first available so he can discuss further follow up regarding plan of care for mediastinal lymphadenopathy.  Thanks so much.

## 2017-03-05 ENCOUNTER — Telehealth: Payer: Self-pay | Admitting: Acute Care

## 2017-03-05 MED ORDER — AZITHROMYCIN 250 MG PO TABS: ORAL_TABLET | ORAL | 0 refills | Status: DC

## 2017-03-05 MED ORDER — LEVOFLOXACIN 500 MG PO TABS: 500.0000 mg | ORAL_TABLET | Freq: Every day | ORAL | 0 refills | Status: DC

## 2017-03-05 NOTE — Telephone Encounter (Signed)
See 5/17 phone note- will close encounter.

## 2017-03-05 NOTE — Telephone Encounter (Signed)
Called pharmacy to make aware of change from zpak to levaquin.  Med list has been updated.  Pt aware of med change.  Nothing further needed.

## 2017-03-05 NOTE — Telephone Encounter (Signed)
Please call in a z-pack as the pharmacy will not dispense the Levaquin. Thanks

## 2017-03-05 NOTE — Telephone Encounter (Signed)
Pharmacy calling- states that [name] called in today interacts with Effexor- can cause arrhythmia- is holding levaquin until we give approval or alternative.  Sending to [place] for clarification- SG please advise on levaquin.  Thanks!

## 2017-03-05 NOTE — Telephone Encounter (Signed)
Spoke with patient, reviewed results again with patient, states that she did not remember the results. Pt aware that Levaquin 500mg  is being sent to [facility].  Pt states that she has been feeling better which is why she has not called about any medications.  Pt denies any chest discomfort.  Pt scheduled to see NP on 03/27/17 at 12pm with SG Pt scheduled first available with 06/10/17 at 3:45pm with Dr Lamonte Sakai.

## 2017-03-05 NOTE — Telephone Encounter (Signed)
Will send back to Sarah per her request so that she can discuss patient with Dr Lamonte Sakai.

## 2017-03-12 ENCOUNTER — Ambulatory Visit
Admission: RE | Admit: 2017-03-12 | Discharge: 2017-03-12 | Disposition: A | Discharge status: Home / Self Care | Payer: Medicare Other | Source: Ambulatory Visit | Attending: Internal Medicine | Admitting: Internal Medicine

## 2017-03-12 ENCOUNTER — Other Ambulatory Visit: Payer: Self-pay | Admitting: Internal Medicine

## 2017-03-12 DIAGNOSIS — Z1231 Encounter for screening mammogram for malignant neoplasm of breast: Secondary | ICD-10-CM

## 2017-03-14 HISTORY — PX: LUNG SURGERY: SHX703

## 2017-03-27 ENCOUNTER — Ambulatory Visit: Payer: Medicare Other | Admitting: Acute Care

## 2017-03-27 LAB — FUNGUS CULTURE WITH STAIN

## 2017-03-27 LAB — FUNGUS CULTURE RESULT

## 2017-03-27 LAB — FUNGAL ORGANISM REFLEX

## 2017-04-09 LAB — ACID FAST CULTURE WITH REFLEXED SENSITIVITIES (MYCOBACTERIA): Acid Fast Culture: NEGATIVE

## 2017-04-09 LAB — ACID FAST CULTURE WITH REFLEXED SENSITIVITIES

## 2017-04-10 DIAGNOSIS — R195 Other fecal abnormalities: Secondary | ICD-10-CM | POA: Insufficient documentation

## 2017-04-11 ENCOUNTER — Telehealth: Payer: Self-pay | Admitting: Gastroenterology

## 2017-04-11 NOTE — Telephone Encounter (Signed)
Patient reports that she had a positive IFOB at her primary care.  She will come in and see Tye Savoy RNP on 04/18/17

## 2017-04-13 NOTE — Progress Notes (Deleted)
[name] Date of Birth: 1945/10/23   History of Present Illness: [name] is seen today for evaluation of chest pain.  She does have a known history of coronary disease with a 60-70% stenosis in the mid LAD. She had cardiac caths in 1999, 2002,  2006, and 2012 that showed stable findings. Stress myoview in March 2011 was normal. Echo in 2007 showed mild LVH with normal EF. Mild AV sclerosis. She has a history of chronic tobacco abuse, HTN, hyperlipidemia, and DM. She has known carotid arterial disease with chronic occlusion of the left ICA and 40-59% RICA stenosis. This is followed yearly by [facility]. She has GERD and chronic pancreatitis.   She was seen in January 2017 for evaluation of chest pain. She reports an episode of severe jaw pain last month radiating into her neck, back, and chest. This improved with sl Ntg. She was placed on Imdur and Lexiscan myoview ordered. No recurrent chest pain or jaw pain. She generally feels better on Imdur. When seen last she complained of intermittent diarrhea and RUQ abdominal pain. Seen by GI who felt this was related to inadequately treated pancreatic insufficiency. CT showed a fatty liver and enlarged chest lymph nodes. She underwent bronchoscopy with bx which showed no malignancy but washing + for strep pneumo treated with antibiotics.   Current Outpatient Prescriptions on File Prior to Visit  Medication Sig Dispense Refill  . ABILIFY 5 MG tablet Take 5 mg by mouth daily.     Marland Kitchen amLODipine (NORVASC) 10 MG tablet Take 10 mg by mouth daily.      . ASPERCREME HEAT 10 % GEL Apply 1 application topically as needed (arthritis).   12  . aspirin EC 81 MG tablet Take 81 mg by mouth every evening.    Marland Kitchen atenolol (TENORMIN) 50 MG tablet Take 50 mg by mouth 2 (two) times daily.     Marland Kitchen atorvastatin (LIPITOR) 20 MG tablet Take 20 mg by mouth daily.      Marland Kitchen azithromycin (ZITHROMAX) 250 MG tablet Take 2 today, then 1 daily until gone. 6 tablet 0  . clopidogrel (PLAVIX) 75  MG tablet Take 1 tablet (75 mg total) by mouth daily. Restart this medication on Wednesday 02/26/17. 30 tablet 3  . Cyanocobalamin (VITAMIN B-12 PO) Take 1 tablet by mouth once a week.     . dicyclomine (BENTYL) 10 MG capsule Take 1 capsule (10 mg total) by mouth 4 (four) times daily -  before meals and at bedtime. 120 capsule 11  . diphenhydrAMINE (BENADRYL) 50 MG tablet Take 1 tablet (50 mg total) by mouth daily as needed for allergies.    Marland Kitchen esomeprazole (NEXIUM) 40 MG capsule Take 40 mg by mouth 2 (two) times daily.      . furosemide (LASIX) 20 MG tablet Take 20 mg by mouth daily.  3  . gabapentin (NEURONTIN) 300 MG capsule Take 300 mg by mouth 2 (two) times daily.     Marland Kitchen HYDROcodone-acetaminophen (NORCO/VICODIN) 5-325 MG tablet Take 1 tablet by mouth 4 (four) times daily as needed for pain.  0  . ibuprofen (ADVIL,MOTRIN) 200 MG tablet Take 200-400 mg by mouth daily as needed for headache or moderate pain.    . isosorbide mononitrate (IMDUR) 30 MG 24 hr tablet TAKE 1 TABLET BY MOUTH EVERY DAY 90 tablet 2  . lisinopril (PRINIVIL,ZESTRIL) 40 MG tablet Take 40 mg by mouth daily.      . methscopolamine (PAMINE FORTE) 5 MG tablet Take 5 mg  by mouth at bedtime.      . metoprolol succinate (TOPROL-XL) 100 MG 24 hr tablet Take 100 mg by mouth daily.  12  . montelukast (SINGULAIR) 10 MG tablet Take 10 mg by mouth daily.      Marland Kitchen MYRBETRIQ 50 MG TB24 tablet Take 50 mg by mouth daily.  11  . nitroGLYCERIN (NITROSTAT) 0.4 MG SL tablet Place 1 tablet (0.4 mg total) under the tongue every 5 (five) minutes as needed for chest pain. 90 tablet 3  . ondansetron (ZOFRAN) 4 MG/5ML solution Take 4 mg by mouth every 8 (eight) hours as needed for nausea or vomiting. Reported on 11/22/2015    . ONETOUCH VERIO test strip     . Pancrelipase, Lip-Prot-Amyl, (ZENPEP) 25000-79000 units CPEP Take 2 capsules by mouth 3 (three) times daily before meals. And with snacks 300 capsule 5  . rOPINIRole (REQUIP) 0.5 MG tablet Take 0.5 mg  by mouth at bedtime as needed (restless legs).   6  . venlafaxine (EFFEXOR-XR) 150 MG 24 hr capsule Take 150 mg by mouth daily.      Marland Kitchen VICTOZA 18 MG/3ML SOPN Inject 1.8 mg into the skin every evening.   4  . zolpidem (AMBIEN) 10 MG tablet Take 10 mg by mouth at bedtime as needed for sleep.  3   No current facility-administered medications on file prior to visit.     Allergies  Allergen Reactions  . Codeine     Altered mental state   . Iodinated Diagnostic Agents Rash    Past Medical History:  Diagnosis Date  . Anxiety   . Arthropathy, unspecified, site unspecified   . Blind loop syndrome   . Carotid artery disease ([place])    documented occlusion of left ICA  . Coronary artery disease   . Depression   . Diabetes insipidus ([place])   . Diabetes mellitus   . Diverticulosis   . Dizziness    chronic  . Dyslipidemia   . Dyspnea    with exertion  . Fatty liver   . GERD (gastroesophageal reflux disease)    hiatial hernia  . Hepatomegaly   . Hiatal hernia   . HTN (hypertension)   . Irritable bowel syndrome   . Melanosis coli   . Metatarsal fracture right  . Normal nuclear stress test    nml 01/03/10;06/19/07  . Other, mixed, or unspecified nondependent drug abuse, unspecified   . Pancreatitis, chronic ([place])   . Peripheral vascular disease ([place]) 02/20/10   s/p stent of left SFA  . Sleep apnea   . Thyroid nodule   . Unspecified essential hypertension   . Vitamin B12 deficiency     Past Surgical History:  Procedure Laterality Date  . ABDOMINAL HYSTERECTOMY     partial  . AUGMENTATION MAMMAPLASTY     bilateral 1976 retro   . BREAST ENHANCEMENT SURGERY    . CARDIAC CATHETERIZATION  07/07/98;08/31/01;03/04/05   60 -70% LAD  . CATARACT EXTRACTION    . ENDOBRONCHIAL ULTRASOUND Bilateral 02/24/2017   Procedure: ENDOBRONCHIAL ULTRASOUND;  Surgeon: Collene Gobble, MD;  Location: [facility];  Service: Cardiopulmonary;  Laterality: Bilateral;  . EYE SURGERY Left Nov. 2016    Cataract  . EYE SURGERY Right 2001   Cataract  . FEMORAL ARTERY STENT     Left leg  . PATELLA REALIGNMENT Left   . TONSILLECTOMY    . TOTAL ABDOMINAL HYSTERECTOMY    . WRIST SURGERY     right    History  Smoking Status  . Current Every Day Smoker  . Packs/day: 1.00  . Years: 30.00  . Types: Cigarettes  . Last attempt to quit: 10/14/2010  Smokeless Tobacco  . Never Used    History  Alcohol Use No    Family History  Problem Relation Age of Onset  . Hypertension Mother   . Hypertension Father        aneurysm  . Stroke Unknown   . Colon cancer Paternal Grandfather     Review of Systems: The review of systems is as noted in HPI.  Chronic abdominal pain. All other systems were reviewed and are negative.  Physical Exam: There were no vitals taken for this visit. She is an obese white female in no acute distress. Her HEENT exam is unremarkable. Pupils are equal round and reactive. Sclera clear. Oropharynx is clear. Neck is supple no JVD, adenopathy, thyromegaly or bruits. Lungs reveal scattered wheezes. Cardiac exam reveals a regular rate and rhythm without gallop, murmur, or click.  Abdomen is soft and nontender. She has no edema. Her pedal pulses are reduced but palpable. She is alert and oriented x3. Cranial nerves II through XII are intact.  LABORATORY DATA: Lab Results  Component Value Date   WBC 11.7 (H) 01/13/2017   HGB 13.9 02/24/2017   HCT 41.0 02/24/2017   PLT 198.0 01/13/2017   GLUCOSE 108 (H) 02/24/2017   ALT 15 01/13/2017   AST 15 01/13/2017   NA 139 02/24/2017   K 4.0 02/24/2017   CL 102 01/13/2017   CREATININE 0.91 01/13/2017   BUN 12 01/13/2017   CO2 32 01/13/2017   TSH 2.60 01/13/2017   INR 1.0 RATIO 10/01/2007     Reviewed from primary care dated 03/08/16: cholesterol 122, triglycerides 154, HDL 36, LDL 55. A1c 5.8%. CMET and TSH normal.   Myoview 11/03/15:  Study Highlights     There was no ST segment deviation noted during stress.  The  study is normal.  This is a low risk study.  This study was note gated due to frequent PVCs. Therefore no ejection fraction information is available.     Assessment / Plan: 1. CAD with chronic moderate disease in the mid LAD.  Myoview study was normal in January.  Continue current therapy.  I would not recommend further evaluation unless symptoms markedly progress or she has objective evidence of ischemia.  2. Chronic tobacco abuse, recommend smoking cessation  3. COPD  4. DM type 2  5. HTN controlled.  6. Hyperlipidemia. On statin.  7. GERD  8. Chronic pancreatitis. Chronic abdominal pain, nausea, diarrhea. Recommend followup with GI.

## 2017-04-15 ENCOUNTER — Ambulatory Visit: Payer: Medicare Other | Admitting: Cardiology

## 2017-04-18 ENCOUNTER — Ambulatory Visit (INDEPENDENT_AMBULATORY_CARE_PROVIDER_SITE_OTHER): Payer: Medicare Other | Admitting: Nurse Practitioner

## 2017-04-18 ENCOUNTER — Encounter: Payer: Self-pay | Admitting: Nurse Practitioner

## 2017-04-18 ENCOUNTER — Telehealth: Payer: Self-pay

## 2017-04-18 VITALS — BP 142/88 | HR 76 | Ht 63.0 in | Wt 185.5 lb

## 2017-04-18 DIAGNOSIS — R195 Other fecal abnormalities: Secondary | ICD-10-CM | POA: Diagnosis not present

## 2017-04-18 DIAGNOSIS — Z7901 Long term (current) use of anticoagulants: Secondary | ICD-10-CM

## 2017-04-18 MED ORDER — NA SULFATE-K SULFATE-MG SULF 17.5-3.13-1.6 GM/177ML PO SOLN: ORAL | 0 refills | Status: DC

## 2017-04-18 NOTE — Patient Instructions (Signed)
If you are age 70 or older, your body mass index should be between 23-30. Your Body mass index is 32.86 kg/m. If this is out of the aforementioned range listed, please consider follow up with your Primary Care Provider.  If you are age 4 or younger, your body mass index should be between 19-25. Your Body mass index is 32.86 kg/m. If this is out of the aformentioned range listed, please consider follow up with your Primary Care Provider.   You have been scheduled for a colonoscopy. Please follow written instructions given to you at your visit today.  Please pick up your prep supplies at the pharmacy within the next 1-3 days. If you use inhalers (even only as needed), please bring them with you on the day of your procedure. Your physician has requested that you go to www.startemmi.com and enter the access code given to you at your visit today. This web site gives a general overview about your procedure. However, you should still follow specific instructions given to you by our office regarding your preparation for the procedure.  You will be contacted by our office prior to your procedure for directions on holding your Plavix.  If you do not hear from our office 1 week prior to your scheduled procedure, please call [phone] to discuss.   Thank you for choosing me and [facility].   Tye Savoy, NP

## 2017-04-18 NOTE — Progress Notes (Signed)
HPI:  Patient is 71 year old female known to Dr. Fuller Plan. She has a history of DM, CAD, PVD and carotid disease. chronic pancreatitis / pancreatic insufficiency, loose stools improved on Zenpep. Patient is here for evaluation of a recent positive IFOB ordered by PCP. She hasn't seen any blood in her stools. No abdominal pain or weight loss. She takes a daily baby asa and plavix. On chronic PPI. Labs from 04/04/17 reviewed. Renal function and liver labs normal. Hemoglobin normal at 15, MCV normal at 91    Past Medical History:  Diagnosis Date  . Anxiety   . Arthropathy, unspecified, site unspecified   . Blind loop syndrome   . Carotid artery disease ([place])    documented occlusion of left ICA  . Coronary artery disease   . Depression   . Diabetes insipidus ([place])   . Diabetes mellitus   . Diverticulosis   . Dizziness    chronic  . Dyslipidemia   . Dyspnea    with exertion  . Fatty liver   . GERD (gastroesophageal reflux disease)    hiatial hernia  . Hepatomegaly   . Hiatal hernia   . HTN (hypertension)   . Irritable bowel syndrome   . Melanosis coli   . Metatarsal fracture right  . Normal nuclear stress test    nml 01/03/10;06/19/07  . Other, mixed, or unspecified nondependent drug abuse, unspecified   . Pancreatitis, chronic ([place])   . Peripheral vascular disease ([place]) 02/20/10   s/p stent of left SFA  . Sleep apnea   . Thyroid nodule   . Unspecified essential hypertension   . Vitamin B12 deficiency      Past Surgical History:  Procedure Laterality Date  . ABDOMINAL HYSTERECTOMY     partial  . AUGMENTATION MAMMAPLASTY     bilateral 1976 retro   . BREAST ENHANCEMENT SURGERY    . CARDIAC CATHETERIZATION  07/07/98;08/31/01;03/04/05   60 -70% LAD  . CATARACT EXTRACTION    . ENDOBRONCHIAL ULTRASOUND Bilateral 02/24/2017   Procedure: ENDOBRONCHIAL ULTRASOUND;  Surgeon: Collene Gobble, MD;  Location: [facility];  Service: Cardiopulmonary;  Laterality: Bilateral;  .  EYE SURGERY Left Nov. 2016   Cataract  . EYE SURGERY Right 2001   Cataract  . FEMORAL ARTERY STENT     Left leg  . LUNG SURGERY  03/2017   Benign polyps removed  . PATELLA REALIGNMENT Left   . TONSILLECTOMY    . TOTAL ABDOMINAL HYSTERECTOMY    . WRIST SURGERY     right   Family History  Problem Relation Age of Onset  . Hypertension Mother   . Hypertension Father        aneurysm  . Stroke Unknown   . Colon cancer Paternal Grandfather    Social History  Substance Use Topics  . Smoking status: Current Some Day Smoker    Packs/day: 0.00    Years: 30.00    Types: Cigarettes    Last attempt to quit: 10/14/2010  . Smokeless tobacco: Never Used     Comment: Pt trying to d/c. Smokes 1 cig/day  . Alcohol use No   Current Outpatient Prescriptions  Medication Sig Dispense Refill  . ABILIFY 5 MG tablet Take 5 mg by mouth daily.     Marland Kitchen amLODipine (NORVASC) 10 MG tablet Take 10 mg by mouth daily.      . ASPERCREME HEAT 10 % GEL Apply 1 application topically as needed (arthritis).   12  . aspirin  EC 81 MG tablet Take 81 mg by mouth every evening.    Marland Kitchen atenolol (TENORMIN) 50 MG tablet Take 50 mg by mouth 2 (two) times daily.     Marland Kitchen atorvastatin (LIPITOR) 20 MG tablet Take 20 mg by mouth daily.      Marland Kitchen azithromycin (ZITHROMAX) 250 MG tablet Take 2 today, then 1 daily until gone. 6 tablet 0  . clopidogrel (PLAVIX) 75 MG tablet Take 1 tablet (75 mg total) by mouth daily. Restart this medication on Wednesday 02/26/17. 30 tablet 3  . Cyanocobalamin (VITAMIN B-12 PO) Take 1 tablet by mouth once a week.     . dicyclomine (BENTYL) 10 MG capsule Take 1 capsule (10 mg total) by mouth 4 (four) times daily -  before meals and at bedtime. 120 capsule 11  . diphenhydrAMINE (BENADRYL) 50 MG tablet Take 1 tablet (50 mg total) by mouth daily as needed for allergies.    Marland Kitchen esomeprazole (NEXIUM) 40 MG capsule Take 40 mg by mouth 2 (two) times daily.      . furosemide (LASIX) 20 MG tablet Take 20 mg by mouth  daily.  3  . gabapentin (NEURONTIN) 300 MG capsule Take 300 mg by mouth 2 (two) times daily.     Marland Kitchen HYDROcodone-acetaminophen (NORCO/VICODIN) 5-325 MG tablet Take 1 tablet by mouth 4 (four) times daily as needed for pain.  0  . ibuprofen (ADVIL,MOTRIN) 200 MG tablet Take 200-400 mg by mouth daily as needed for headache or moderate pain.    . isosorbide mononitrate (IMDUR) 30 MG 24 hr tablet TAKE 1 TABLET BY MOUTH EVERY DAY 90 tablet 2  . lisinopril (PRINIVIL,ZESTRIL) 40 MG tablet Take 40 mg by mouth daily.      . methscopolamine (PAMINE FORTE) 5 MG tablet Take 5 mg by mouth at bedtime.      . metoprolol succinate (TOPROL-XL) 100 MG 24 hr tablet Take 100 mg by mouth daily.  12  . montelukast (SINGULAIR) 10 MG tablet Take 10 mg by mouth daily.      Marland Kitchen MYRBETRIQ 50 MG TB24 tablet Take 50 mg by mouth daily.  11  . nitroGLYCERIN (NITROSTAT) 0.4 MG SL tablet Place 1 tablet (0.4 mg total) under the tongue every 5 (five) minutes as needed for chest pain. 90 tablet 3  . ondansetron (ZOFRAN) 4 MG/5ML solution Take 4 mg by mouth every 8 (eight) hours as needed for nausea or vomiting. Reported on 11/22/2015    . ONETOUCH VERIO test strip     . Pancrelipase, Lip-Prot-Amyl, (ZENPEP) 25000-79000 units CPEP Take 2 capsules by mouth 3 (three) times daily before meals. And with snacks 300 capsule 5  . rOPINIRole (REQUIP) 0.5 MG tablet Take 0.5 mg by mouth at bedtime as needed (restless legs).   6  . venlafaxine (EFFEXOR-XR) 150 MG 24 hr capsule Take 150 mg by mouth daily.      Marland Kitchen VICTOZA 18 MG/3ML SOPN Inject 1.8 mg into the skin every evening.   4  . zolpidem (AMBIEN) 10 MG tablet Take 10 mg by mouth at bedtime as needed for sleep.  3   No current facility-administered medications for this visit.    Allergies  Allergen Reactions  . Codeine     Altered mental state   . Iodinated Diagnostic Agents Rash     Review of Systems: All systems reviewed and negative except where noted in HPI.    Physical Exam: BP  (!) 142/88   Pulse 76   Ht 5\' 3"  (  1.6 m)   Wt 185 lb 8 oz (84.1 kg)   BMI 32.86 kg/m  Constitutional:  Well-developed, white female in no acute distress. Psychiatric: Normal mood and affect. Behavior is normal. EENT: Pupils normal.  Conjunctivae are normal. No scleral icterus. Neck supple.  Cardiovascular: Normal rate, regular rhythm. No edema Pulmonary/chest: Effort normal and breath sounds normal. No wheezing, rales or rhonchi. Abdominal: Soft, nondistended. Nontender. Bowel sounds active throughout. There are no masses palpable. No hepatomegaly. Lymphadenopathy: No cervical adenopathy noted. Neurological: Alert and oriented to person place and time. Skin: Skin is warm and dry. No rashes noted.   ASSESSMENT AND PLAN:  1 71 yo female with positive IFOB. No abdominal pain, bowel changes, overt bleeding or anemia. No polyps at time of colonoscopy Oct 2011 but prep was poor.  -Patient needs repeat colonoscopy to rule out colon polyps / colon neoplasm. The risks and benefits of the procedure were discussed and the patient agrees to proceed.   2. PVD / carotid disease. On plavix.  -Hold plavix 5 days before procedure - will instruct when and how to resume after procedure. Patient understands that there is a low but real risk of cardiovascular event such as embolism, thrombosis or stroke while off plavix. The patient consents to proceed. Will communicate by phone or EMR with patient's prescribing provider to confirm that holding plavix is reasonable in this case.   3. Tobacco abuse / pulmonary nodule on CTscan. She had a bronchoscopy with biopsy - negative for malignancy.   4. Chronic pancreatitis with pancreatic insufficiency.  -continue Zenpep.   5. Probable fatty liver disease based on imaging. Labs labs historically normal.    Tye Savoy, NP  04/18/2017, 2:57 PM  Cc: Leanna Battles, MD

## 2017-04-18 NOTE — Telephone Encounter (Signed)
  [facility] [address] [place], [place]  [address] Phone:  [phone]   Fax:  [phone]  04/18/2017   RE:      [name] DOB:   September 06, 1946 MRN:   [id number]   Dear Dr. Sharlett Iles,    We have scheduled the above patient for an endoscopic procedure. Our records show that she is on anticoagulation therapy.   Please advise as to how long the patient may come off her therapy of Plavix prior to the colonoscopy procedure, which is scheduled for 05/01/17.  Please fax back/ or route the completed form to [facility] [place] at [phone].   Sincerely,    Thurmon Fair, RMA

## 2017-04-21 ENCOUNTER — Ambulatory Visit (INDEPENDENT_AMBULATORY_CARE_PROVIDER_SITE_OTHER): Payer: Medicare Other | Admitting: Physician Assistant

## 2017-04-21 ENCOUNTER — Encounter: Payer: Self-pay | Admitting: Physician Assistant

## 2017-04-21 VITALS — BP 166/100 | HR 96 | Ht 63.0 in | Wt 184.0 lb

## 2017-04-21 DIAGNOSIS — E119 Type 2 diabetes mellitus without complications: Secondary | ICD-10-CM

## 2017-04-21 DIAGNOSIS — I739 Peripheral vascular disease, unspecified: Secondary | ICD-10-CM | POA: Diagnosis not present

## 2017-04-21 DIAGNOSIS — E785 Hyperlipidemia, unspecified: Secondary | ICD-10-CM

## 2017-04-21 DIAGNOSIS — I10 Essential (primary) hypertension: Secondary | ICD-10-CM

## 2017-04-21 DIAGNOSIS — I2510 Atherosclerotic heart disease of native coronary artery without angina pectoris: Secondary | ICD-10-CM

## 2017-04-21 MED ORDER — ISOSORBIDE MONONITRATE ER 60 MG PO TB24: 60.0000 mg | ORAL_TABLET | Freq: Every day | ORAL | 3 refills | Status: DC

## 2017-04-21 NOTE — Patient Instructions (Addendum)
Medication Instructions:   INCREASE Imdur to 60mg  DAILY.  Check your blood pressure daily. Keep a diary of your daily BP checks. Bring your BP cuff and all your home medications to your next appointment.  Labwork:   none  Testing/Procedures:  none  Follow-Up:  3 weeks with Almyra Deforest PA for medication adjustment.  If you need a refill on your cardiac medications before your next appointment, please call your pharmacy.

## 2017-04-21 NOTE — Progress Notes (Signed)
Cardiology Office Note    Date:  04/21/2017   ID:  [name], [name] [birth year], MRN [id number]  PCP:  Joanna Battles, MD  Cardiologist:  Dr. Martinique  Chief Complaint  Patient presents with  . Follow-up    seen for Dr. Martinique.    History of Present Illness:  [name] is a 71 y.o. female with PMH of carotid artery disease with known occlusion of left ICA, CAD, DM 2, diabetes insipidus, dizziness, hyperlipidemia, GERD, hypertension, chronic pancreatitis, PAD with stent to the left SFA, obstructive sleep apnea. He has known history of coronary artery disease with 60-70% stenosis in mid LAD. Cardiac catheterization in 1999, 2002, 2006, 2012 showed stable anatomy. Stress mildly in 12/2009 was normal. She had a history of chronic tobacco abuse. Her carotid artery disease is being followed yearly by [facility]. Last Myoview obtained on 11/03/2015 was normal, low risk study, no significant ischemia. Her lower extremity arterial Doppler obtained in February 2018 showed normal flow on the right, mild occlusive disease on the left. Carotid Doppler obtained on 11/26/2016 showed greater than 50% left mid common carotid artery stenosis, known left internal carotid occlusion, right internal carotid artery suggestive 40-59% stenosis. She recently had bronchoscopy for hemoptysis with abnormal chest x-ray, no obvious bleeding source noted. Culture grew Streptococcus pneumonia.  She presents today for follow-up. She has been doing well from cardiology perspective. She denies any recent chest pain or significant shortness of breath. Her blood pressure has been elevated in the recent several months. On further review, she is on both atenolol and Toprol-XL, her heart rate is mildly tachycardic today. Blood pressure 166/100. She said she did take her blood pressure medication around 10 AM this morning, roughly 4 hours prior to clinic visit. Due to uncontrolled blood pressure and questionable double beta blocker, I  recommended to increase Imdur to 60 mg. I will bring her back in 3 weeks for blood pressure medication titration and reconsideration. She has been instructed to keep a blood pressure diary. She will also bring all her medication bottles with her and also blood pressure cuff with her.   Past Medical History:  Diagnosis Date  . Anxiety   . Arthropathy, unspecified, site unspecified   . Blind loop syndrome   . Carotid artery disease ([place])    documented occlusion of left ICA  . Coronary artery disease   . Depression   . Diabetes insipidus ([name])   . Diabetes mellitus   . Diverticulosis   . Dizziness    chronic  . Dyslipidemia   . Dyspnea    with exertion  . Fatty liver   . GERD (gastroesophageal reflux disease)    hiatial hernia  . Hepatomegaly   . Hiatal hernia   . HTN (hypertension)   . Irritable bowel syndrome   . Melanosis coli   . Metatarsal fracture right  . Normal nuclear stress test    nml 01/03/10;06/19/07  . Other, mixed, or unspecified nondependent drug abuse, unspecified   . Pancreatitis, chronic ([name])   . Peripheral vascular disease ([name]) 02/20/10   s/p stent of left SFA  . Sleep apnea   . Thyroid nodule   . Unspecified essential hypertension   . Vitamin B12 deficiency     Past Surgical History:  Procedure Laterality Date  . ABDOMINAL HYSTERECTOMY     partial  . AUGMENTATION MAMMAPLASTY     bilateral 1976 retro   . BREAST ENHANCEMENT SURGERY    . CARDIAC CATHETERIZATION  07/07/98;08/31/01;03/04/05   60 -70% LAD  . CATARACT EXTRACTION    . ENDOBRONCHIAL ULTRASOUND Bilateral 02/24/2017   Procedure: ENDOBRONCHIAL ULTRASOUND;  Surgeon: Collene Gobble, MD;  Location: [facility];  Service: Cardiopulmonary;  Laterality: Bilateral;  . EYE SURGERY Left Nov. 2016   Cataract  . EYE SURGERY Right 2001   Cataract  . FEMORAL ARTERY STENT     Left leg  . LUNG SURGERY  03/2017   Benign polyps removed  . PATELLA REALIGNMENT Left   . TONSILLECTOMY    . TOTAL ABDOMINAL  HYSTERECTOMY    . WRIST SURGERY     right    Current Medications: Outpatient Medications Prior to Visit  Medication Sig Dispense Refill  . ABILIFY 5 MG tablet Take 5 mg by mouth daily.     Marland Kitchen amLODipine (NORVASC) 10 MG tablet Take 10 mg by mouth daily.      . ASPERCREME HEAT 10 % GEL Apply 1 application topically as needed (arthritis).   12  . aspirin EC 81 MG tablet Take 81 mg by mouth every evening.    Marland Kitchen atenolol (TENORMIN) 50 MG tablet Take 50 mg by mouth 2 (two) times daily.     Marland Kitchen atorvastatin (LIPITOR) 20 MG tablet Take 20 mg by mouth daily.      Marland Kitchen azithromycin (ZITHROMAX) 250 MG tablet Take 2 today, then 1 daily until gone. 6 tablet 0  . clopidogrel (PLAVIX) 75 MG tablet Take 1 tablet (75 mg total) by mouth daily. Restart this medication on Wednesday 02/26/17. 30 tablet 3  . Cyanocobalamin (VITAMIN B-12 PO) Take 1 tablet by mouth once a week.     . dicyclomine (BENTYL) 10 MG capsule Take 1 capsule (10 mg total) by mouth 4 (four) times daily -  before meals and at bedtime. 120 capsule 11  . diphenhydrAMINE (BENADRYL) 50 MG tablet Take 1 tablet (50 mg total) by mouth daily as needed for allergies.    Marland Kitchen esomeprazole (NEXIUM) 40 MG capsule Take 40 mg by mouth 2 (two) times daily.      . furosemide (LASIX) 20 MG tablet Take 20 mg by mouth daily.  3  . gabapentin (NEURONTIN) 300 MG capsule Take 300 mg by mouth 2 (two) times daily.     Marland Kitchen HYDROcodone-acetaminophen (NORCO/VICODIN) 5-325 MG tablet Take 1 tablet by mouth 4 (four) times daily as needed for pain.  0  . ibuprofen (ADVIL,MOTRIN) 200 MG tablet Take 200-400 mg by mouth daily as needed for headache or moderate pain.    Marland Kitchen lisinopril (PRINIVIL,ZESTRIL) 40 MG tablet Take 40 mg by mouth daily.      . methscopolamine (PAMINE FORTE) 5 MG tablet Take 5 mg by mouth at bedtime.      . metoprolol succinate (TOPROL-XL) 100 MG 24 hr tablet Take 100 mg by mouth daily.  12  . montelukast (SINGULAIR) 10 MG tablet Take 10 mg by mouth daily.      Marland Kitchen  MYRBETRIQ 50 MG TB24 tablet Take 50 mg by mouth daily.  11  . Na Sulfate-K Sulfate-Mg Sulf 17.5-3.13-1.6 GM/180ML SOLN Suprep-Use as directed 354 mL 0  . nitroGLYCERIN (NITROSTAT) 0.4 MG SL tablet Place 1 tablet (0.4 mg total) under the tongue every 5 (five) minutes as needed for chest pain. 90 tablet 3  . ondansetron (ZOFRAN) 4 MG/5ML solution Take 4 mg by mouth every 8 (eight) hours as needed for nausea or vomiting. Reported on 11/22/2015    . ONETOUCH VERIO test strip     .  Pancrelipase, Lip-Prot-Amyl, (ZENPEP) 25000-79000 units CPEP Take 2 capsules by mouth 3 (three) times daily before meals. And with snacks 300 capsule 5  . rOPINIRole (REQUIP) 0.5 MG tablet Take 0.5 mg by mouth at bedtime as needed (restless legs).   6  . venlafaxine (EFFEXOR-XR) 150 MG 24 hr capsule Take 150 mg by mouth daily.      Marland Kitchen VICTOZA 18 MG/3ML SOPN Inject 1.8 mg into the skin every evening.   4  . zolpidem (AMBIEN) 10 MG tablet Take 10 mg by mouth at bedtime as needed for sleep.  3  . isosorbide mononitrate (IMDUR) 30 MG 24 hr tablet TAKE 1 TABLET BY MOUTH EVERY DAY 90 tablet 2   No facility-administered medications prior to visit.      Allergies:   Codeine and Iodinated diagnostic agents   Social History   Social History  . Marital status: Divorced    Spouse name: N/A  . Number of children: 2  . Years of education: N/A   Occupational History  . retired Disabled   Social History Main Topics  . Smoking status: Current Some Day Smoker    Packs/day: 0.00    Years: 30.00    Types: Cigarettes    Last attempt to quit: 10/14/2010  . Smokeless tobacco: Never Used     Comment: Pt trying to d/c. Smokes 1 cig/day  . Alcohol use No  . Drug use: No  . Sexual activity: Not Asked   Other Topics Concern  . None   Social History Narrative  . None     Family History:  The patient's family history includes Colon cancer in her paternal grandfather; Hypertension in her father and mother.   ROS:   Please see the  history of present illness.    ROS All other systems reviewed and are negative.   PHYSICAL EXAM:   VS:  BP (!) 166/100   Pulse 96   Ht 5\' 3"  (1.6 m)   Wt 184 lb (83.5 kg)   SpO2 95%   BMI 32.59 kg/m    GEN: Well nourished, well developed, in no acute distress  HEENT: normal  Neck: no JVD, carotid bruits, or masses Cardiac: RRR; no murmurs, rubs, or gallops,no edema  Respiratory:  clear to auscultation bilaterally, normal work of breathing GI: soft, nontender, nondistended, + BS MS: no deformity or atrophy  Skin: warm and dry, no rash Neuro:  Alert and Oriented x 3, Strength and sensation are intact Psych: euthymic mood, full affect  Wt Readings from Last 3 Encounters:  04/21/17 184 lb (83.5 kg)  04/18/17 185 lb 8 oz (84.1 kg)  02/24/17 180 lb (81.6 kg)      Studies/Labs Reviewed:   EKG:  EKG is not ordered today.    Recent Labs: 01/13/2017: ALT 15; BUN 12; Creatinine, Ser 0.91; Platelets 198.0; TSH 2.60 02/24/2017: Hemoglobin 13.9; Potassium 4.0; Sodium 139   Lipid Panel No results found for: CHOL, TRIG, HDL, CHOLHDL, VLDL, LDLCALC, LDLDIRECT  Additional studies/ records that were reviewed today include:   Cath 03/04/2005  ANGIOGRAPHIC DATA:  1.  The left coronary arises from a small root with upward takeoff.  2.  Left main coronary is normal.  3.  The left anterior descending artery has a very focal stenosis in the      midvessel following a small second diagonal branch that is approximately      60-70% narrowed. The remainder of the LAD is without significant  disease.  4.  The first diagonal branch is without significant disease.  5.  Left circumflex coronary has minor irregularities proximally. His rise      to two small marginal vessels which appear normal.  6.  The right coronary arises and distributes normally. It is a dominant      vessel. There is a segmental 30% narrowing in the midvessel.   LEFT VENTRICULAR ANGIOGRAPHY:  Performed in RAO view. This  demonstrates  normal left ventricular size and contractility with normal systolic  function. Ejection fraction is estimated 65%. There is no mitral  regurgitation or prolapse.   FINAL INTERPRETATION:  1.  Modest single vessel coronary disease involving the mid left anterior      descending artery.  2.  Normal left ventricular function.  3.  No significant change compared to prior angiograms in 1999 and 2002    Myoview 11/03/2015 Study Highlights    There was no ST segment deviation noted during stress.  The study is normal.  This is a low risk study.  This study was note gated due to frequent PVCs. Therefore no ejection fraction information is available.      Carotid Doppler 11/26/2016   ASSESSMENT:    1. Coronary artery disease involving native coronary artery of native heart without angina pectoris   2. PAD (peripheral artery disease) ([place])   3. Essential hypertension   4. Hyperlipidemia, unspecified hyperlipidemia type   5. Controlled type 2 diabetes mellitus without complication, without long-term current use of insulin ([facility])      PLAN:  In order of problems listed above:  1. CAD: Single-vessel CAD, I was able to locate the 2006 cath report, however unable to locate 2012 cath report. According to Dr. Doug Sou note, stenosis in LAD has been stable. Patient denies any recent exertional type angina. Last Myoview in January 2017 was reassuring.    2. PAD: Has both carotid artery stenosis with known occluded left ICA and moderate disease in the right ICA. Followed by vascular surgery. Also have known history of lower extremity peripheral vascular disease s/p L SFA procedure, recent ABI in February 2018 did not show significant stenosis.  - She has upcoming colonoscopy scheduled. She is likely taking aspirin and Plavix for her carotid disease instead cardiac. GI will check with vascular surgery regarding when to hold the Plavix. From her cardiac perspective, given lack of  chest pain or prior PCI, there is no reason she can't temporarily come off plavix.  3. Hypertension: Uncontrolled, she is on multiple drugs for blood pressure, currently maximized on blood pressure medication except for Imdur. I will increase Imdur to 60 mg daily. Her blood pressure has been high in the recent several month. Also it is unclear which medication she is on as she is currently on 2 beta blockers. I have advised the patient to keep a blood pressure during, she will see me back in 3 weeks, I will review her medication bottles and also test her home blood pressure cuff.  4. Hyperlipidemia: Continue Lipitor 20 mg daily, will defer lab work to primary care doctor, I was unable to locate any of the previous fasting lipid panel lab work in [facility].  5. DM 2: Managed by primary care provider.   Medication Adjustments/Labs and Tests Ordered: Current medicines are reviewed at length with the patient today.  Concerns regarding medicines are outlined above.  Medication changes, Labs and Tests ordered today are listed in the Patient Instructions below. Patient Instructions  Medication  Instructions:   INCREASE Imdur to 60mg  DAILY.  Check your blood pressure daily. Keep a diary of your daily BP checks. Bring your BP cuff and all your home medications to your next appointment.  Labwork:   none  Testing/Procedures:  none  Follow-Up:  3 weeks with Almyra Deforest PA for medication adjustment.  If you need a refill on your cardiac medications before your next appointment, please call your pharmacy.      Hilbert Corrigan, [place]  04/21/2017 5:11 PM    [facility] [address], [place], [place]  [address] Phone: [phone]; Fax: [phone]

## 2017-04-21 NOTE — Progress Notes (Signed)
Reviewed and agree with management plan.  Malcolm T. Stark, MD FACG 

## 2017-04-22 ENCOUNTER — Encounter: Payer: Medicare Other | Admitting: Gastroenterology

## 2017-04-24 ENCOUNTER — Other Ambulatory Visit: Payer: Self-pay | Admitting: Acute Care

## 2017-04-24 ENCOUNTER — Ambulatory Visit (INDEPENDENT_AMBULATORY_CARE_PROVIDER_SITE_OTHER): Payer: Medicare Other | Admitting: Acute Care

## 2017-04-24 ENCOUNTER — Encounter: Payer: Self-pay | Admitting: Acute Care

## 2017-04-24 DIAGNOSIS — F17210 Nicotine dependence, cigarettes, uncomplicated: Secondary | ICD-10-CM

## 2017-04-24 DIAGNOSIS — E669 Obesity, unspecified: Secondary | ICD-10-CM | POA: Diagnosis not present

## 2017-04-24 DIAGNOSIS — R918 Other nonspecific abnormal finding of lung field: Secondary | ICD-10-CM

## 2017-04-24 NOTE — Assessment & Plan Note (Signed)
Please continue to work on weight loss

## 2017-04-24 NOTE — Patient Instructions (Addendum)
It is good to see you today. Please quit smoking. This is the single is powerful action you can take to improve your pulmonary function and decrease your risk of lung cancer. I am glad you are feeling better. Bronch report does not indicate any malignant cells. Follow up with Dr. Lamonte Sakai as scheduled 06/10/2017 for follow up on lung nodule Please follow up with PCP about urinary symptoms. We will schedule you for Pulmonary Function tests. CT scan chest without contrast November 2018, already ordered. Please make sure you keep this appointment Please contact office for sooner follow up if symptoms do not improve or worsen or seek emergency care

## 2017-04-24 NOTE — Telephone Encounter (Signed)
Per Dr. Philip Aspen - have pt hold Plavix 6 days prior to procedure and restart the day after procedure.  Pt advised and confirmed instructions.  Hand written instructions per Dr. Philip Aspen scanned to chart.

## 2017-04-24 NOTE — Progress Notes (Signed)
History of Present Illness [name] is a 71 y.o. female with history of coronary disease, diabetes, hiatal hernia and GERD, pancreatic insufficiency followed by gastroenterology, PVD and carotid disease. She was  referred for hemoptysis and an abnormal CT scan chest. She is followed by Dr. Lamonte Sakai.   04/24/2017 Follow up: Patient presents for follow-up after treatment with antibiotic for incidental finding of  strep pneumonia in BAL done 02/24/2017 for pulmonary nodule. She was treated with Levaquin, which she completed her  She has no clinical signs or symptoms of URI. She denies fever or chest pain, but states she did not have fever or chest pain prior to incidental finding.She is scheduled for colonoscopy next week as follow-up to the finding of blood in her stool. She is continuing to smoke, she has cut back from 1 PPD to about 15 cigarettes per day. I have encouraged her to quit smoking. Patient denies cough, purulent sputum, chest pain, fever, orthopnea or hemoptysis.  I have spent 5 minutes counseling patient on smoking cessation this visit. I have provided her with the " Be stronger than your excuses" card with community resources to assist in smoking cessation. Patient verbalizes understanding of the risk of continued smoking To her health.  Test Results: BAL: 02/24/2017 70,000 COLONIES/mL STREPTOCOCCUS PNEUMONIAE   Cytology: 02/24/2017 BRONCHIAL LAVAGE, RIGHT UPPER LOBE (SPECIMEN 4 OF 4 COLLECTED 02/24/17): NO MALIGNANT CELLS IDENTIFIED  CT chest with contrast> 01/02/2017: IMPRESSION: 1. Multiple nonspecific tiny nodules in both lungs, all measuring 4 mm or less in maximum diameter each. These could be infectious in nature (e.g. granulomas). Early metastatic disease is unlikely in the absence of known malignancy. No follow-up needed if patient is low-risk (and has no known or suspected primary neoplasm). Non-contrast chest CT can be considered in 12 months if patient  is high-risk.  2. Minimal mediastinal and bilateral hilar adenopathy. This could be reactive or neoplastic in nature. 3. Small right pleural effusion. 4. Multiple small thyroid nodules, all measuring 8 mm or less in maximum diameter. These are too small to characterize, but most likely benign in the absence of known clinical risk factors for thyroid carcinoma. 5. Coronary artery and aortic atherosclerosis  CBC Latest Ref Rng & Units 02/24/2017 01/13/2017 04/09/2011  WBC 4.0 - 10.5 K/uL - 11.7(H) 12.3(H)  Hemoglobin 12.0 - 15.0 g/dL 13.9 14.4 14.5  Hematocrit 36.0 - 46.0 % 41.0 42.9 42.8  Platelets 150.0 - 400.0 K/uL - 198.0 309.0    BMP Latest Ref Rng & Units 02/24/2017 01/13/2017 04/09/2011  Glucose 65 - 99 mg/dL 108(H) 88 89  BUN 6 - 23 mg/dL - 12 14  Creatinine 0.40 - 1.20 mg/dL - 0.91 1.0  Sodium 135 - 145 mmol/L 139 139 141  Potassium 3.5 - 5.1 mmol/L 4.0 4.0 4.4  Chloride 96 - 112 mEq/L - 102 103  CO2 19 - 32 mEq/L - 32 30  Calcium 8.4 - 10.5 mg/dL - 9.3 9.5       Past medical hx Past Medical History:  Diagnosis Date  . Anxiety   . Arthropathy, unspecified, site unspecified   . Blind loop syndrome   . Carotid artery disease ([place])    documented occlusion of left ICA  . Coronary artery disease   . Depression   . Diabetes insipidus ([place])   . Diabetes mellitus   . Diverticulosis   . Dizziness    chronic  . Dyslipidemia   . Dyspnea    with exertion  . Fatty  liver   . GERD (gastroesophageal reflux disease)    hiatial hernia  . Hepatomegaly   . Hiatal hernia   . HTN (hypertension)   . Irritable bowel syndrome   . Melanosis coli   . Metatarsal fracture right  . Normal nuclear stress test    nml 01/03/10;06/19/07  . Other, mixed, or unspecified nondependent drug abuse, unspecified   . Pancreatitis, chronic ([place])   . Peripheral vascular disease ([place]) 02/20/10   s/p stent of left SFA  . Sleep apnea   . Thyroid nodule   . Unspecified essential hypertension   .  Vitamin B12 deficiency      Social History  Substance Use Topics  . Smoking status: Current Some Day Smoker    Packs/day: 1.50    Years: 30.00    Types: Cigarettes    Last attempt to quit: 10/14/2010  . Smokeless tobacco: Never Used  . Alcohol use No    Tobacco Cessation: Patient is a current every day smoker smokes 15 cigarettes a day. Patient has a 45-pack-year smoking history  Past surgical hx, Family hx, Social hx all reviewed.  Current Outpatient Prescriptions on File Prior to Visit  Medication Sig  . ABILIFY 5 MG tablet Take 5 mg by mouth daily.   Marland Kitchen amLODipine (NORVASC) 10 MG tablet Take 10 mg by mouth daily.    . ASPERCREME HEAT 10 % GEL Apply 1 application topically as needed (arthritis).   Marland Kitchen aspirin EC 81 MG tablet Take 81 mg by mouth every evening.  Marland Kitchen atenolol (TENORMIN) 50 MG tablet Take 50 mg by mouth 2 (two) times daily.   Marland Kitchen atorvastatin (LIPITOR) 20 MG tablet Take 20 mg by mouth daily.    Marland Kitchen azithromycin (ZITHROMAX) 250 MG tablet Take 2 today, then 1 daily until gone.  . clopidogrel (PLAVIX) 75 MG tablet Take 1 tablet (75 mg total) by mouth daily. Restart this medication on Wednesday 02/26/17.  . Cyanocobalamin (VITAMIN B-12 PO) Take 1 tablet by mouth once a week.   . dicyclomine (BENTYL) 10 MG capsule Take 1 capsule (10 mg total) by mouth 4 (four) times daily -  before meals and at bedtime.  . diphenhydrAMINE (BENADRYL) 50 MG tablet Take 1 tablet (50 mg total) by mouth daily as needed for allergies.  Marland Kitchen esomeprazole (NEXIUM) 40 MG capsule Take 40 mg by mouth 2 (two) times daily.    . furosemide (LASIX) 20 MG tablet Take 20 mg by mouth daily.  Marland Kitchen gabapentin (NEURONTIN) 300 MG capsule Take 300 mg by mouth 2 (two) times daily.   Marland Kitchen HYDROcodone-acetaminophen (NORCO/VICODIN) 5-325 MG tablet Take 1 tablet by mouth 4 (four) times daily as needed for pain.  Marland Kitchen ibuprofen (ADVIL,MOTRIN) 200 MG tablet Take 200-400 mg by mouth daily as needed for headache or moderate pain.  .  isosorbide mononitrate (IMDUR) 60 MG 24 hr tablet Take 1 tablet (60 mg total) by mouth daily.  Marland Kitchen lisinopril (PRINIVIL,ZESTRIL) 40 MG tablet Take 40 mg by mouth daily.    . methscopolamine (PAMINE FORTE) 5 MG tablet Take 5 mg by mouth at bedtime.    . metoprolol succinate (TOPROL-XL) 100 MG 24 hr tablet Take 100 mg by mouth daily.  . montelukast (SINGULAIR) 10 MG tablet Take 10 mg by mouth daily.    Marland Kitchen MYRBETRIQ 50 MG TB24 tablet Take 50 mg by mouth daily.  . Na Sulfate-K Sulfate-Mg Sulf 17.5-3.13-1.6 GM/180ML SOLN Suprep-Use as directed  . nitroGLYCERIN (NITROSTAT) 0.4 MG SL tablet Place 1 tablet (0.4  mg total) under the tongue every 5 (five) minutes as needed for chest pain.  Marland Kitchen ondansetron (ZOFRAN) 4 MG/5ML solution Take 4 mg by mouth every 8 (eight) hours as needed for nausea or vomiting. Reported on 11/22/2015  . ONETOUCH VERIO test strip   . Pancrelipase, Lip-Prot-Amyl, (ZENPEP) 25000-79000 units CPEP Take 2 capsules by mouth 3 (three) times daily before meals. And with snacks  . rOPINIRole (REQUIP) 0.5 MG tablet Take 0.5 mg by mouth at bedtime as needed (restless legs).   . venlafaxine (EFFEXOR-XR) 150 MG 24 hr capsule Take 150 mg by mouth daily.    Marland Kitchen VICTOZA 18 MG/3ML SOPN Inject 1.8 mg into the skin every evening.   . zolpidem (AMBIEN) 10 MG tablet Take 10 mg by mouth at bedtime as needed for sleep.   No current facility-administered medications on file prior to visit.      Allergies  Allergen Reactions  . Codeine     Altered mental state   . Iodinated Diagnostic Agents Rash    Review Of Systems:  Constitutional:   No  weight loss, night sweats,  Fevers, chills, fatigue, or  lassitude.  HEENT:   No headaches,  Difficulty swallowing,  Tooth/dental problems, or  Sore throat,                No sneezing, itching, ear ache, nasal congestion, post nasal drip,   CV:  No chest pain,  Orthopnea, PND, swelling in lower extremities, anasarca, dizziness, palpitations, syncope.   GI  No  heartburn, indigestion, abdominal pain, nausea, vomiting, diarrhea, change in bowel habits, loss of appetite, bloody stools.   Resp: + shortness of breath with exertion not  at rest.  No excess mucus, no productive cough,  No non-productive cough,  No coughing up of blood.  No change in color of mucus.  No wheezing.  No chest wall deformity  Skin: no rash or lesions.  GU: no dysuria, change in color of urine, no urgency or frequency.  No flank pain, no hematuria   MS:  No joint pain or swelling.  No decreased range of motion.  No back pain.  Psych:  No change in mood or affect. No depression or anxiety.  No memory loss.   Vital Signs BP 120/70 (BP Location: Left Arm, Cuff Size: Normal)   Pulse 84   Ht _0  (1.549 m)   Wt 180 lb 9.6 oz (81.9 kg)   SpO2 92%   BMI 34.12 kg/m    Physical Exam:  General- No distress,  A&Ox3, pleasant, flat affect  ENT: No sinus tenderness, TM clear, pale nasal mucosa, no oral exudate,no post nasal drip, no LAN Cardiac: S1, S2, regular rate and rhythm, no murmur Chest: No wheeze/ rales/ dullness; no accessory muscle use, no nasal flaring, no sternal retractions Abd.: Soft Non-tender, bowel sounds positive, nondistended, obese  Ext: No clubbing cyanosis, edema Neuro:  normal strength Skin: No rashes, warm and dry Psych: Flat affect   Assessment/Plan  Pulmonary nodules/lesions, multiple BAL cytology negative for malignant cells Plan Quit smoking, patient counseled at length Follow-up CT scan in November 2018  Obesity Please continue to work on weight loss  BAL 02/24/2017 with incidental finding positive for streptococcal pneumonia Treated with Levaquin 7 days No clinical signs or symptoms of pneumonia Please contact office for sooner follow up if symptoms do not improve or worsen or seek emergency care    Suspicion of COPD and long-term smoker Plan Schedule PFTs   Lars Masson  Elie Confer, NP 04/24/2017  9:14 PM

## 2017-04-24 NOTE — Assessment & Plan Note (Signed)
BAL cytology negative for malignant cells Plan Quit smoking, patient counseled at length Follow-up CT scan in November 2018

## 2017-05-01 ENCOUNTER — Ambulatory Visit (AMBULATORY_SURGERY_CENTER): Payer: Medicare Other | Admitting: Gastroenterology

## 2017-05-01 ENCOUNTER — Encounter: Payer: Self-pay | Admitting: Gastroenterology

## 2017-05-01 VITALS — BP 148/87 | HR 87 | Temp 98.0°F | Resp 20 | Ht 63.0 in | Wt 185.0 lb

## 2017-05-01 DIAGNOSIS — D120 Benign neoplasm of cecum: Secondary | ICD-10-CM | POA: Diagnosis not present

## 2017-05-01 DIAGNOSIS — D125 Benign neoplasm of sigmoid colon: Secondary | ICD-10-CM

## 2017-05-01 DIAGNOSIS — R195 Other fecal abnormalities: Secondary | ICD-10-CM | POA: Diagnosis present

## 2017-05-01 MED ORDER — SODIUM CHLORIDE 0.9 % IV SOLN: 500.0000 mL | INTRAVENOUS | Status: DC

## 2017-05-01 NOTE — Patient Instructions (Signed)
   RESUME PLAVIX IN 2 DAYS  REPEAT COLONOSCOPY IN 6 MONTHS  NO ASPIRIN, ASPIRIN CONTAINING PRODUCTS (BC OR GOODY POWDERS) OR NSAIDS (IBUPROFEN, ADVIL, ALEVE, AND MOTRIN) FOR 2 WEEKS AFTER POLYP REMOVAL; TYLENOL IS OK TO TAKE    YOU HAD AN ENDOSCOPIC PROCEDURE TODAY AT THE [facility]:   Refer to the procedure report that was given to you for any specific questions about what was found during the examination.  If the procedure report does not answer your questions, please call your gastroenterologist to clarify.  If you requested that your care partner not be given the details of your procedure findings, then the procedure report has been included in a sealed envelope for you to review at your convenience later.  YOU SHOULD EXPECT: Some feelings of bloating in the abdomen. Passage of more gas than usual.  Walking can help get rid of the air that was put into your GI tract during the procedure and reduce the bloating. If you had a lower endoscopy (such as a colonoscopy or flexible sigmoidoscopy) you may notice spotting of blood in your stool or on the toilet paper. If you underwent a bowel prep for your procedure, you may not have a normal bowel movement for a few days.  Please Note:  You might notice some irritation and congestion in your nose or some drainage.  This is from the oxygen used during your procedure.  There is no need for concern and it should clear up in a day or so.  SYMPTOMS TO REPORT IMMEDIATELY:   Following lower endoscopy (colonoscopy or flexible sigmoidoscopy):  Excessive amounts of blood in the stool  Significant tenderness or worsening of abdominal pains  Swelling of the abdomen that is new, acute  Fever of 100F or higher    For urgent or emergent issues, a gastroenterologist can be reached at any hour by calling [phone].   DIET:  We do recommend a small meal at first, but then you may proceed to your regular diet.  Drink plenty of fluids but you  should avoid alcoholic beverages for 24 hours.  ACTIVITY:  You should plan to take it easy for the rest of today and you should NOT DRIVE or use heavy machinery until tomorrow (because of the sedation medicines used during the test).    FOLLOW UP: Our staff will call the number listed on your records the next business day following your procedure to check on you and address any questions or concerns that you may have regarding the information given to you following your procedure. If we do not reach you, we will leave a message.  However, if you are feeling well and you are not experiencing any problems, there is no need to return our call.  We will assume that you have returned to your regular daily activities without incident.  If any biopsies were taken you will be contacted by phone or by letter within the next 1-3 weeks.  Please call us at [phone] if you have not heard about the biopsies in 3 weeks.    SIGNATURES/CONFIDENTIALITY: You and/or your care partner have signed paperwork which will be entered into your electronic medical record.  These signatures attest to the fact that that the information above on your After Visit Summary has been reviewed and is understood.  Full responsibility of the confidentiality of this discharge information lies with you and/or your care-partner.

## 2017-05-01 NOTE — Progress Notes (Signed)
Pt's states no medical or surgical changes since previsit or office visit. 

## 2017-05-01 NOTE — Progress Notes (Signed)
Called to room to assist during endoscopic procedure.  Patient ID and intended procedure confirmed with present staff. Received instructions for my participation in the procedure from the performing physician.  

## 2017-05-01 NOTE — Progress Notes (Signed)
Report to PACU, RN, vss, BBS= Clear.  

## 2017-05-01 NOTE — Op Note (Signed)
[place] Patient Name: [name] Procedure Date: 05/01/2017 9:41 AM MRN: [id number] Endoscopist: Ladene Artist , MD Age: 71 Referring MD:  Date of Birth: [birth year] Gender: Female Account #: [id number] Procedure:                Colonoscopy Indications:              Positive fecal immunochemical test Medicines:                Monitored Anesthesia Care Procedure:                Pre-Anesthesia Assessment:                           - Prior to the procedure, a History and Physical                            was performed, and patient medications and                            allergies were reviewed. The patient's tolerance of                            previous anesthesia was also reviewed. The risks                            and benefits of the procedure and the sedation                            options and risks were discussed with the patient.                            All questions were answered, and informed consent                            was obtained. Prior Anticoagulants: The patient has                            taken Plavix (clopidogrel), last dose was 5 days                            prior to procedure. ASA Grade Assessment: III - A                            patient with severe systemic disease. After                            reviewing the risks and benefits, the patient was                            deemed in satisfactory condition to undergo the                            procedure.  After obtaining informed consent, the colonoscope                            was passed under direct vision. Throughout the                            procedure, the patient's blood pressure, pulse, and                            oxygen saturations were monitored continuously. The                            Colonoscope was introduced through the anus and                            advanced to the the cecum, identified by          appendiceal orifice and ileocecal valve. The                            ileocecal valve, appendiceal orifice, and rectum                            were photographed. The quality of the bowel                            preparation was adequate after extensive lavage and                            suctioning. The colonoscopy was performed without                            difficulty. The patient tolerated the procedure                            well. Scope In: 9:51:49 AM Scope Out: 10:17:09 AM Scope Withdrawal Time: 0 hours 17 minutes 56 seconds  Total Procedure Duration: 0 hours 25 minutes 20 seconds  Findings:                 The perianal and digital rectal examinations were                            normal.                           A 14 mm polyp was found in the appendiceal orifice.                            The polyp was sessile. The polyp was removed with a                            piecemeal technique using a hot snare. Resection                            and  retrieval were complete.                           A 12 mm polyp was found in the cecum. The polyp was                            sessile. The polyp was removed with a piecemeal                            technique using a hot snare. Resection and                            retrieval were complete.                           Two sessile polyps were found in the sigmoid colon.                            The polyps were 5 to 8 mm in size. These polyps                            were removed with a cold snare. Resection and                            retrieval were complete.                           Multiple medium-mouthed diverticula were found in                            the left colon. There was narrowing of the colon in                            association with the diverticular opening. There                            was evidence of diverticular spasm. There was no                            evidence of  diverticular bleeding.                           The exam was otherwise without abnormality on                            direct and retroflexion views. Complications:            No immediate complications. Estimated blood loss:                            None. Estimated Blood Loss:     Estimated blood loss: none. Impression:               - One 14 mm polyp at the appendiceal orifice,  removed piecemeal using a hot snare. Resected and                            retrieved.                           - One 12 mm polyp in the cecum, removed piecemeal                            using a hot snare. Resected and retrieved.                           - Two 5 to 8 mm polyps in the sigmoid colon,                            removed with a cold snare. Resected and retrieved.                           - Severe diverticulosis in the left colon. There                            was narrowing of the colon in association with the                            diverticular opening. There was evidence of                            diverticular spasm. There was no evidence of                            diverticular bleeding.                           - The examination was otherwise normal on direct                            and retroflexion views. Recommendation:           - Repeat colonoscopy in 6 months for surveillance                            after piecemeal polypectomy with a more extensive                            bowel prep.                           - Resume Plavix (clopidogrel) in 2 days at prior                            dose. Refer to managing physician for further                            adjustment of therapy.                           -  Patient has a contact number available for                            emergencies. The signs and symptoms of potential                            delayed complications were discussed with the                            patient.  Return to normal activities tomorrow.                            Written discharge instructions were provided to the                            patient.                           - Resume previous diet.                           - Continue present medications.                           - Await pathology results.                           - No aspirin, ibuprofen, naproxen, or other                            non-steroidal anti-inflammatory drugs for 2 weeks                            after polyp removal. Ladene Artist, MD 05/01/2017 10:27:37 AM This report has been signed electronically.

## 2017-05-02 ENCOUNTER — Telehealth: Payer: Self-pay

## 2017-05-02 NOTE — Telephone Encounter (Signed)
  Follow up Call-  Call back number 05/01/2017  Post procedure Call Back phone  # [phone]  Permission to leave phone message Yes  Some recent data might be hidden     Patient questions:  Do you have a fever, pain , or abdominal swelling? No. Pain Score  0 *  Have you tolerated food without any problems? Yes.    Have you been able to return to your normal activities? Yes.    Do you have any questions about your discharge instructions: Diet   No. Medications  No. Follow up visit  No.  Do you have questions or concerns about your Care? No.  Actions: * If pain score is 4 or above: No action needed, pain <4.  No problems noted per pt. maw

## 2017-05-06 ENCOUNTER — Encounter: Payer: Self-pay | Admitting: Neurology

## 2017-05-07 ENCOUNTER — Ambulatory Visit (INDEPENDENT_AMBULATORY_CARE_PROVIDER_SITE_OTHER): Payer: Medicare Other | Admitting: Neurology

## 2017-05-07 ENCOUNTER — Encounter: Payer: Self-pay | Admitting: Neurology

## 2017-05-07 VITALS — BP 144/75 | HR 81 | Ht 63.0 in | Wt 178.0 lb

## 2017-05-07 DIAGNOSIS — I739 Peripheral vascular disease, unspecified: Secondary | ICD-10-CM | POA: Insufficient documentation

## 2017-05-07 DIAGNOSIS — R351 Nocturia: Secondary | ICD-10-CM

## 2017-05-07 DIAGNOSIS — F5103 Paradoxical insomnia: Secondary | ICD-10-CM

## 2017-05-07 DIAGNOSIS — I25119 Atherosclerotic heart disease of native coronary artery with unspecified angina pectoris: Secondary | ICD-10-CM

## 2017-05-07 DIAGNOSIS — E0859 Diabetes mellitus due to underlying condition with other circulatory complications: Secondary | ICD-10-CM | POA: Diagnosis not present

## 2017-05-07 DIAGNOSIS — J449 Chronic obstructive pulmonary disease, unspecified: Secondary | ICD-10-CM

## 2017-05-07 DIAGNOSIS — I70219 Atherosclerosis of native arteries of extremities with intermittent claudication, unspecified extremity: Secondary | ICD-10-CM

## 2017-05-07 DIAGNOSIS — Z72821 Inadequate sleep hygiene: Secondary | ICD-10-CM

## 2017-05-07 DIAGNOSIS — G4733 Obstructive sleep apnea (adult) (pediatric): Secondary | ICD-10-CM | POA: Diagnosis not present

## 2017-05-07 NOTE — Progress Notes (Signed)
SLEEP MEDICINE CLINIC   Provider:  Larey Seat, M D  Primary Care Physician:  Leanna Battles, MD   Referring Provider: Leanna Battles, MD    Chief Complaint  Patient presents with  . New Patient (Initial Visit)  patient is alone.   HPI:  [name] is a 71 y.o. female , seen here as in a referral/ revisit  from Dr. Philip Aspen for a sleep evaluation.  Chief complaint according to patient : " Trouble to go to sleep and trouble to stay asleep".  Dr. Philip Aspen referred this 71 year old Caucasian right-handed lady for sleep evaluation based on his suspicion that she may have obstructive sleep apnea. [name] has been a lifelong smoker, she has borderline elevated blood pressures, she started smoking in 1966. She does not use alcohol, she is up-to-date with her wellness checks and vaccinations. She has a history of coronary artery disease and underwent a cardiac cath in 1999 again in 2011 she had no evidence of ischemia. 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 By ultrasound she was diagnosed with a fatty liver, she has thyroid nodules, left hip arthritis, hemoptysis at work pulmonary consult had been requested in March of this year after a CT of her chest that showed multiple small bilateral lung nodules. She followed with a biopsy and was told there was no malignancy. she also has a history of pancreas insufficiency , dx in 2011.  Sleep habits are as follows: The patient does not have established that or rise times, most nights she goes to bed by 11 PM- she has a TV in her bedroom and she goes to sleep watching TV. Some nights she  may be up for an hour or longer before she falls asleep. The TV is not set to a timer. It runs all night. She reports that once asleep she will still wake up frequently, only a few of these arousals are related to the urge to go to the bathroom. She also coughs which wakes her, has wheezing, struggles for breath. She props up on multiple pillows. The head of bed cannot be elevated. She falls asleep on her side. She wakes up about every hour, if she goes to the bathroom and comes normally right back to sleep . Ever since she retired she has no longer a set morning rise time, and she wakes up spontaneously. This can be as early as 4:30 in the morning or as late as 7:30 AM. She estimated her total sleep time to be 4 -5 hours , she rises un-refreshed. She has been told she snores, wakes up with a dry, parched mouth.  She has headaches in AM.  Sleep medical history and family sleep history: mother had arthritis, father died young. The patient underwent tonsillectomy in childhood, she had a right thyroid biopsy in April 2017, she has severe diverticulitis, cataract surgery on the right eye, patellar relocation surgery on the left knee.   Social history:  Single- divorced , 2 adult children, 2 sons. Healthy. Smoker- since 1966 - 1 ppd, ETOH none, no history of alcohol abuse. Caffeine - diet cokes 2 liters a day.  No coffee and no tea.   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  I feel tired, and I cannot sleep.   Epworth score  12/24  , Fatigue severity score not answered , depression score not answered.    Social History   Social History  . Marital status: Divorced    Spouse name: N/A  . Number of children: 2  . Years of education: N/A   Occupational History  . retired Disabled    Social History Main Topics  . Smoking status: Current Some Day Smoker    Packs/day: 1.50    Years: 30.00    Types: Cigarettes    Last attempt to quit: 10/14/2010  . Smokeless tobacco: Never Used  . Alcohol use No  . Drug use: No  . Sexual activity: Not on file   Other Topics Concern  . Not on file   Social History Narrative  . No narrative on file    Family History  Problem Relation Age of Onset  . Hypertension Mother   . Osteoarthritis Mother   . Pulmonary embolism Mother   . Hypertension Father        aneurysm  . Stroke Father   . Stroke Unknown   . Colon cancer Paternal Grandfather     Past Medical History:  Diagnosis Date  . Anxiety   . Arthropathy, unspecified, site unspecified   . Blind loop syndrome   . Carotid artery disease ([place])    documented occlusion of left ICA  . Coronary artery disease   . Depression   . Diabetes insipidus ([place])   . Diabetes mellitus   . Diverticulosis   . Dizziness    chronic  . Dyslipidemia   . Dyspnea    with exertion  . Fatty liver   . GERD (gastroesophageal reflux disease)    hiatial hernia  . Hepatomegaly   . Hiatal hernia   . HTN (hypertension)   . Hyperlipidemia   . Hypertension   . Irritable bowel syndrome   . Melanosis coli   . Metatarsal fracture right  . Normal nuclear stress test    nml 01/03/10;06/19/07  . Other, mixed, or unspecified nondependent drug abuse, unspecified   . Pancreatitis, chronic ([place])   . Peripheral vascular disease ([place]) 02/20/10   s/p stent of left SFA  . Sleep apnea   . Stroke ([place])   . Thyroid nodule   . Unspecified essential hypertension   . Vitamin B12 deficiency     Past Surgical History:  Procedure Laterality Date  . ABDOMINAL HYSTERECTOMY     partial  . AUGMENTATION MAMMAPLASTY     bilateral 1976 retro   . BREAST ENHANCEMENT SURGERY    . CARDIAC CATHETERIZATION  07/07/98;08/31/01;03/04/05   60 -70% LAD  . CATARACT EXTRACTION    . ENDOBRONCHIAL ULTRASOUND Bilateral  02/24/2017   Procedure: ENDOBRONCHIAL ULTRASOUND;  Surgeon: Collene Gobble, MD;  Location: [facility];  Service: Cardiopulmonary;  Laterality: Bilateral;  . EYE SURGERY Left Nov. 2016   Cataract  . EYE SURGERY Right 2001   Cataract  . FEMORAL ARTERY STENT     Left leg  . LUNG SURGERY  03/2017   Benign polyps removed  . PATELLA REALIGNMENT Left   . TONSILLECTOMY    . TOTAL ABDOMINAL HYSTERECTOMY    . WRIST SURGERY  right    Current Outpatient Prescriptions  Medication Sig Dispense Refill  . ABILIFY 5 MG tablet Take 5 mg by mouth daily.     Marland Kitchen amLODipine (NORVASC) 10 MG tablet Take 10 mg by mouth daily.      . ASPERCREME HEAT 10 % GEL Apply 1 application topically as needed (arthritis).   12  . aspirin EC 81 MG tablet Take 81 mg by mouth every evening.    Marland Kitchen atenolol (TENORMIN) 50 MG tablet Take 50 mg by mouth 2 (two) times daily.     Marland Kitchen atorvastatin (LIPITOR) 20 MG tablet Take 20 mg by mouth daily.      Marland Kitchen azithromycin (ZITHROMAX) 250 MG tablet Take 2 today, then 1 daily until gone. 6 tablet 0  . clopidogrel (PLAVIX) 75 MG tablet Take 1 tablet (75 mg total) by mouth daily. Restart this medication on Wednesday 02/26/17. 30 tablet 3  . Cyanocobalamin (VITAMIN B-12 PO) Take 1 tablet by mouth once a week.     . dicyclomine (BENTYL) 10 MG capsule Take 1 capsule (10 mg total) by mouth 4 (four) times daily -  before meals and at bedtime. 120 capsule 11  . diphenhydrAMINE (BENADRYL) 50 MG tablet Take 1 tablet (50 mg total) by mouth daily as needed for allergies.    Marland Kitchen esomeprazole (NEXIUM) 40 MG capsule Take 40 mg by mouth 2 (two) times daily.      . furosemide (LASIX) 20 MG tablet Take 20 mg by mouth daily.  3  . gabapentin (NEURONTIN) 300 MG capsule Take 300 mg by mouth 2 (two) times daily.     Marland Kitchen HYDROcodone-acetaminophen (NORCO/VICODIN) 5-325 MG tablet Take 1 tablet by mouth 4 (four) times daily as needed for pain.  0  . ibuprofen (ADVIL,MOTRIN) 200 MG tablet Take 200-400 mg by mouth daily  as needed for headache or moderate pain.    . isosorbide mononitrate (IMDUR) 60 MG 24 hr tablet Take 1 tablet (60 mg total) by mouth daily. 30 tablet 3  . lisinopril (PRINIVIL,ZESTRIL) 40 MG tablet Take 40 mg by mouth daily.      . methscopolamine (PAMINE FORTE) 5 MG tablet Take 5 mg by mouth at bedtime.      . metoprolol succinate (TOPROL-XL) 100 MG 24 hr tablet Take 100 mg by mouth daily.  12  . montelukast (SINGULAIR) 10 MG tablet Take 10 mg by mouth daily.      Marland Kitchen MYRBETRIQ 50 MG TB24 tablet Take 50 mg by mouth daily.  11  . nitroGLYCERIN (NITROSTAT) 0.4 MG SL tablet Place 1 tablet (0.4 mg total) under the tongue every 5 (five) minutes as needed for chest pain. 90 tablet 3  . ondansetron (ZOFRAN) 4 MG/5ML solution Take 4 mg by mouth every 8 (eight) hours as needed for nausea or vomiting. Reported on 11/22/2015    . ONETOUCH VERIO test strip     . Pancrelipase, Lip-Prot-Amyl, (ZENPEP) 25000-79000 units CPEP Take 2 capsules by mouth 3 (three) times daily before meals. And with snacks 300 capsule 5  . rOPINIRole (REQUIP) 0.5 MG tablet Take 0.5 mg by mouth at bedtime as needed (restless legs).   6  . venlafaxine (EFFEXOR-XR) 150 MG 24 hr capsule Take 150 mg by mouth daily.      Marland Kitchen VICTOZA 18 MG/3ML SOPN Inject 1.8 mg into the skin every evening.   4  . zolpidem (AMBIEN) 10 MG tablet Take 10 mg by mouth at bedtime as needed for sleep.  3  Current Facility-Administered Medications  Medication Dose Route Frequency Provider Last Rate Last Dose  . 0.9 %  sodium chloride infusion  500 mL Intravenous Continuous Ladene Artist, MD        Allergies as of 05/07/2017 - Review Complete 05/07/2017  Allergen Reaction Noted  . Codeine  11/08/2010  . Iodinated diagnostic agents Rash 11/08/2010    Vitals: BP (!) 144/75   Pulse 81   Ht 5\' 3"  (1.6 m)   Wt 178 lb (80.7 kg)   BMI 31.53 kg/m  Last Weight:  Wt Readings from Last 1 Encounters:  05/07/17 178 lb (80.7 kg)   IRJ:JOAC mass index is 31.53  kg/m.     Last Height:   Ht Readings from Last 1 Encounters:  05/07/17 5\' 3"  (1.6 m)    Physical exam:  General: The patient is awake, alert and appears not in acute distress. The patient is well groomed. Head: Normocephalic, atraumatic. Neck is supple. Mallampati 5,  neck circumference:16.5. Nasal airflow congested , TMJ click not  evident . Retrognathia is seen.  Cardiovascular:  Regular rate and rhythm, with distended neck veins. Respiratory: Lungs with rhonci, COPD.  Skin:  Without evidence of edema, or rash Trunk: BMI is 32 . The patient's posture is stooped   Neurologic exam : The patient is awake and alert, oriented to place and time.   She doesn't answer questions to the point, only in estimates, " a lot", "around 10 years" "sometimes" ,  Attention span & concentration ability appears normal.  Speech is fluent,   Mood and affect are appropriate.  Cranial nerves:Pupils are equal and briskly reactive to light. Funduscopic exam without evidence of pallor or edema- corrective glasses, status post cataract . Extraocular movements  in vertical and horizontal planes intact and without nystagmus. Visual fields by finger perimetry are intact. Hearing to finger rub intact.  Facial sensation intact to fine touch.Facial motor strength is symmetric and tongue and uvula move midline. Shoulder shrug was symmetrical.  Motor exam:   Normal tone, muscle bulk and symmetric strength in upper  extremities. Sensory:  Fine touch, pinprick and vibration were tested in all extremities.  She has decreased vibration sense in both lower legs. Proprioception tested in the upper extremities was normal. Coordination: Rapid alternating movements in the fingers/hands was slowed . Finger-to-nose maneuver  with evidence of  dysmetria ,not  tremor. Gait and station: Patient walks without assistive device .Tandem gait is unfragmented. Turns with 3  Steps.  Deep tendon reflexes: in the  upper and lower extremities are  symmetric and intact.  Left knee pain to patella reflex testing.  Babinski maneuver response is downgoing.  Assessment:  After physical and neurologic examination, review of laboratory studies,  Personal review of imaging studies, reports of other /same  Imaging studies, results of polysomnography and / or neurophysiology testing and pre-existing records as far as provided in visit., my assessment is   1)  [name] has several physiological risk factors for sleep apnea, sleep hypoxemia including her long-standing tobacco use, rhonchi, and peripheral vascular disease, cerebrovascular disease and coronary artery disease.  She has a larger than average neck circumference for woman, and elevated body mass index at 32, and a very high-grade narrowing of her upper airway; her uvula does not become visible above the tongue, I would grade her Mallampati 5.  She wakes up with a dry mouth and has been told that she snores, given her comorbidities and risk factors I have no doubt  that she will have also some degree of sleep apnea. I will order a split night polysomnography to allow the option of giving additionally oxygen, should she suffer from severe sleep hypoxemia. Her diagnosis at this time is a suspected obstructive sleep apnea overlap with COPD.  In addition there is a long-standing complaint of insomnia by now the patient has no longer a sleep and bedtime routine for times of sleep initiation, morning rise time and she needs to establish soles. I would very much like her to eliminate the TV from the bedroom. Maybe she can put her to the first on a sleep timer for 30 minutes so that it will not run in the background throughout the night. The TV in mid sounds and light that affect the melatonin production and can wake a light sleeper easily.  The patient was advised of the nature of the diagnosed disorder , the treatment options and the  risks for general health and wellness arising from not treating the  condition.   I spent more than 45 minutes of face to face time with the patient.  Greater than 50% of time was spent in counseling and coordination of care. We have discussed the diagnosis and differential and I answered the patient's questions.    Plan:  Treatment plan and additional workup :  this patient needs to work on sleep hygiene. Smoking cessation and exercise.  I will test her for OSA with COPD in an attended sleep study. Capnography will be needed.  RV after sleep test.     Larey Seat, MD 3/90/3009, 23:30 AM  Certified in Neurology by ABPN Certified in [place] by [facility] [address] [place], [place] [address]

## 2017-05-08 ENCOUNTER — Encounter: Payer: Self-pay | Admitting: Gastroenterology

## 2017-05-14 ENCOUNTER — Ambulatory Visit: Payer: Medicare Other | Admitting: Physician Assistant

## 2017-05-20 ENCOUNTER — Ambulatory Visit (INDEPENDENT_AMBULATORY_CARE_PROVIDER_SITE_OTHER): Payer: Medicare Other | Admitting: Neurology

## 2017-05-20 DIAGNOSIS — F5103 Paradoxical insomnia: Secondary | ICD-10-CM

## 2017-05-20 DIAGNOSIS — G4733 Obstructive sleep apnea (adult) (pediatric): Secondary | ICD-10-CM

## 2017-05-20 DIAGNOSIS — J449 Chronic obstructive pulmonary disease, unspecified: Secondary | ICD-10-CM

## 2017-05-20 DIAGNOSIS — Z72821 Inadequate sleep hygiene: Secondary | ICD-10-CM

## 2017-05-20 DIAGNOSIS — I25119 Atherosclerotic heart disease of native coronary artery with unspecified angina pectoris: Secondary | ICD-10-CM

## 2017-05-20 DIAGNOSIS — I70219 Atherosclerosis of native arteries of extremities with intermittent claudication, unspecified extremity: Secondary | ICD-10-CM

## 2017-06-01 NOTE — Progress Notes (Deleted)
Cardiology Office Note    Date:  06/01/2017   ID:  [name], [name] 02/24/1946, MRN [id number]  PCP:  Leanna Battles, MD  Cardiologist: Dr. [place]   No chief complaint on file.   History of Present Illness:    [name] is a 71 y.o. female with past medical history of CAD (known 60-70% mid-LAD stenosis with caths in 1999, 2002, 2006, and 2012 showing stable anatomy, low-risk NST in 10/2015), carotid artery disease (known occlusion of LICA), PAD (s/p syent to L SFA), HTN, HLD, Type 2 DM, GERD, and chronic pancreatitis who presents to the office today for 21-month follow-up.   She was last examined by Joanna Deforest, PA-C in 04/2017 and denied any recent chest pain or dyspnea on exertion. Upon review of medications, she was on both Atenolol and Toprol-XL per records (unsure if taking both) with BP elevated to 166/100. Her Imdur was increased to 60mg  daily and she was encouraged to check her BP and keep a recording of her readings.     Past Medical History:  Diagnosis Date  . Anxiety   . Arthropathy, unspecified, site unspecified   . Blind loop syndrome   . Carotid artery disease ([place])    documented occlusion of left ICA  . Coronary artery disease   . Depression   . Diabetes insipidus ([place])   . Diabetes mellitus   . Diverticulosis   . Dizziness    chronic  . Dyslipidemia   . Dyspnea    with exertion  . Fatty liver   . GERD (gastroesophageal reflux disease)    hiatial hernia  . Hepatomegaly   . Hiatal hernia   . HTN (hypertension)   . Hyperlipidemia   . Hypertension   . Irritable bowel syndrome   . Melanosis coli   . Metatarsal fracture right  . Normal nuclear stress test    nml 01/03/10;06/19/07  . Other, mixed, or unspecified nondependent drug abuse, unspecified   . Pancreatitis, chronic ([place])   . Peripheral vascular disease ([place]) 02/20/10   s/p stent of left SFA  . Sleep apnea   . Stroke ([place])   . Thyroid nodule   . Unspecified essential hypertension   .  Vitamin B12 deficiency     Past Surgical History:  Procedure Laterality Date  . ABDOMINAL HYSTERECTOMY     partial  . AUGMENTATION MAMMAPLASTY     bilateral 1976 retro   . BREAST ENHANCEMENT SURGERY    . CARDIAC CATHETERIZATION  07/07/98;08/31/01;03/04/05   60 -70% LAD  . CATARACT EXTRACTION    . ENDOBRONCHIAL ULTRASOUND Bilateral 02/24/2017   Procedure: ENDOBRONCHIAL ULTRASOUND;  Surgeon: Collene Gobble, MD;  Location: [facility];  Service: Cardiopulmonary;  Laterality: Bilateral;  . EYE SURGERY Left Nov. 2016   Cataract  . EYE SURGERY Right 2001   Cataract  . FEMORAL ARTERY STENT     Left leg  . LUNG SURGERY  03/2017   Benign polyps removed  . PATELLA REALIGNMENT Left   . TONSILLECTOMY    . TOTAL ABDOMINAL HYSTERECTOMY    . WRIST SURGERY     right    Current Medications: Outpatient Medications Prior to Visit  Medication Sig Dispense Refill  . ABILIFY 5 MG tablet Take 5 mg by mouth daily.     Marland Kitchen amLODipine (NORVASC) 10 MG tablet Take 10 mg by mouth daily.      . ASPERCREME HEAT 10 % GEL Apply 1 application topically as needed (arthritis).   12  .  aspirin EC 81 MG tablet Take 81 mg by mouth every evening.    Marland Kitchen atenolol (TENORMIN) 50 MG tablet Take 50 mg by mouth 2 (two) times daily.     Marland Kitchen atorvastatin (LIPITOR) 20 MG tablet Take 20 mg by mouth daily.      Marland Kitchen azithromycin (ZITHROMAX) 250 MG tablet Take 2 today, then 1 daily until gone. 6 tablet 0  . clopidogrel (PLAVIX) 75 MG tablet Take 1 tablet (75 mg total) by mouth daily. Restart this medication on Wednesday 02/26/17. 30 tablet 3  . Cyanocobalamin (VITAMIN B-12 PO) Take 1 tablet by mouth once a week.     . dicyclomine (BENTYL) 10 MG capsule Take 1 capsule (10 mg total) by mouth 4 (four) times daily -  before meals and at bedtime. 120 capsule 11  . diphenhydrAMINE (BENADRYL) 50 MG tablet Take 1 tablet (50 mg total) by mouth daily as needed for allergies.    Marland Kitchen esomeprazole (NEXIUM) 40 MG capsule Take 40 mg by mouth 2 (two)  times daily.      . furosemide (LASIX) 20 MG tablet Take 20 mg by mouth daily.  3  . gabapentin (NEURONTIN) 300 MG capsule Take 300 mg by mouth 2 (two) times daily.     Marland Kitchen HYDROcodone-acetaminophen (NORCO/VICODIN) 5-325 MG tablet Take 1 tablet by mouth 4 (four) times daily as needed for pain.  0  . ibuprofen (ADVIL,MOTRIN) 200 MG tablet Take 200-400 mg by mouth daily as needed for headache or moderate pain.    . isosorbide mononitrate (IMDUR) 60 MG 24 hr tablet Take 1 tablet (60 mg total) by mouth daily. 30 tablet 3  . lisinopril (PRINIVIL,ZESTRIL) 40 MG tablet Take 40 mg by mouth daily.      . methscopolamine (PAMINE FORTE) 5 MG tablet Take 5 mg by mouth at bedtime.      . metoprolol succinate (TOPROL-XL) 100 MG 24 hr tablet Take 100 mg by mouth daily.  12  . montelukast (SINGULAIR) 10 MG tablet Take 10 mg by mouth daily.      Marland Kitchen MYRBETRIQ 50 MG TB24 tablet Take 50 mg by mouth daily.  11  . nitroGLYCERIN (NITROSTAT) 0.4 MG SL tablet Place 1 tablet (0.4 mg total) under the tongue every 5 (five) minutes as needed for chest pain. 90 tablet 3  . ondansetron (ZOFRAN) 4 MG/5ML solution Take 4 mg by mouth every 8 (eight) hours as needed for nausea or vomiting. Reported on 11/22/2015    . ONETOUCH VERIO test strip     . Pancrelipase, Lip-Prot-Amyl, (ZENPEP) 25000-79000 units CPEP Take 2 capsules by mouth 3 (three) times daily before meals. And with snacks 300 capsule 5  . rOPINIRole (REQUIP) 0.5 MG tablet Take 0.5 mg by mouth at bedtime as needed (restless legs).   6  . venlafaxine (EFFEXOR-XR) 150 MG 24 hr capsule Take 150 mg by mouth daily.      Marland Kitchen VICTOZA 18 MG/3ML SOPN Inject 1.8 mg into the skin every evening.   4  . zolpidem (AMBIEN) 10 MG tablet Take 10 mg by mouth at bedtime as needed for sleep.  3   Facility-Administered Medications Prior to Visit  Medication Dose Route Frequency Provider Last Rate Last Dose  . 0.9 %  sodium chloride infusion  500 mL Intravenous Continuous Ladene Artist, MD          Allergies:   Codeine and Iodinated diagnostic agents   Social History   Social History  . Marital status: Divorced  Spouse name: N/A  . Number of children: 2  . Years of education: N/A   Occupational History  . retired Disabled   Social History Main Topics  . Smoking status: Current Some Day Smoker    Packs/day: 1.50    Years: 30.00    Types: Cigarettes    Last attempt to quit: 10/14/2010  . Smokeless tobacco: Never Used  . Alcohol use No  . Drug use: No  . Sexual activity: Not on file   Other Topics Concern  . Not on file   Social History Narrative  . No narrative on file     Family History:  The patient's ***family history includes Colon cancer in her paternal grandfather; Hypertension in her father and mother; Osteoarthritis in her mother; Pulmonary embolism in her mother; Stroke in her father and unknown relative.   Review of Systems:   Please see the history of present illness.     General:  No chills, fever, night sweats or weight changes.  Cardiovascular:  No chest pain, dyspnea on exertion, edema, orthopnea, palpitations, paroxysmal nocturnal dyspnea. Dermatological: No rash, lesions/masses Respiratory: No cough, dyspnea Urologic: No hematuria, dysuria Abdominal:   No nausea, vomiting, diarrhea, bright red blood per rectum, melena, or hematemesis Neurologic:  No visual changes, wkns, changes in mental status. All other systems reviewed and are otherwise negative except as noted above.   Physical Exam:    VS:  There were no vitals taken for this visit.   General: Well developed, well nourished,female appearing in no acute distress. Head: Normocephalic, atraumatic, sclera non-icteric, no xanthomas, nares are without discharge.  Neck: No carotid bruits. JVD not elevated.  Lungs: Respirations regular and unlabored, without wheezes or rales.  Heart: ***Regular rate and rhythm. No S3 or S4.  No murmur, no rubs, or gallops appreciated. Abdomen: Soft,  non-tender, non-distended with normoactive bowel sounds. No hepatomegaly. No rebound/guarding. No obvious abdominal masses. Msk:  Strength and tone appear normal for age. No joint deformities or effusions. Extremities: No clubbing or cyanosis. No edema.  Distal pedal pulses are 2+ bilaterally. Neuro: Alert and oriented X 3. Moves all extremities spontaneously. No focal deficits noted. Psych:  Responds to questions appropriately with a normal affect. Skin: No rashes or lesions noted  Wt Readings from Last 3 Encounters:  05/07/17 178 lb (80.7 kg)  05/01/17 185 lb (83.9 kg)  04/24/17 180 lb 9.6 oz (81.9 kg)        Studies/Labs Reviewed:   EKG:  EKG is*** ordered today.  The ekg ordered today demonstrates ***  Recent Labs: 01/13/2017: ALT 15; BUN 12; Creatinine, Ser 0.91; Platelets 198.0; TSH 2.60 02/24/2017: Hemoglobin 13.9; Potassium 4.0; Sodium 139   Lipid Panel No results found for: CHOL, TRIG, HDL, CHOLHDL, VLDL, LDLCALC, LDLDIRECT  Additional studies/ records that were reviewed today include:  ***  Assessment:    No diagnosis found.   Plan:   In order of problems listed above:  1. ***    Medication Adjustments/Labs and Tests Ordered: Current medicines are reviewed at length with the patient today.  Concerns regarding medicines are outlined above.  Medication changes, Labs and Tests ordered today are listed in the Patient Instructions below. There are no Patient Instructions on file for this visit.   Signed, Erma Heritage, PA-C  06/01/2017 [facility] [place], [place], [place]  [address] Phone: [phone]; Fax: [phone]  [address], [address] [place],  [place] [address] Phone: [phone]

## 2017-06-02 ENCOUNTER — Ambulatory Visit: Payer: Medicare Other | Admitting: Student

## 2017-06-02 NOTE — Procedures (Signed)
PATIENT'S NAME:  [name], Recinos DOB:      February 08, 1946      MR#:    [id number]     DATE OF RECORDING: 05/20/2017 REFERRING M.D.:  Leanna Battles, MD Study Performed:   Baseline Polysomnogram HISTORY:  Dr. Philip Aspen referred this 71 year old Caucasian right-handed lady for sleep evaluation based on his suspicion that she may have obstructive sleep apnea. Mrs. Uffelman has been a lifelong smoker, she has borderline elevated blood pressures, and has a history of coronary artery disease and underwent a cardiac cath in 1999 again in 2011 she had no evidence of ischemia. 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 By ultrasound she was diagnosed with a fatty liver, she has thyroid nodules, left hip arthritis, hemoptysis at work .Pulmonary consult had followed with a biopsy and was told there was no malignancy. She also has a history of pancreas insufficiency, dx in 2011.  The patient's weight 178 pounds with a height of 63 (inches), resulting in a BMI of 31.6 kg/m2. The patient's neck circumference measured 16.5 inches.  CURRENT MEDICATIONS: Amlodipine, Aspirin, Atorvastatin, Azithromycin, Clopidogrel, Cyanocobalamin, Dicyclomine, Diphenhydramine, Esomeprazole, Furosemide, Gabapentin, Hydrocodone, Ibuprofen, iso mono-nitrate, scopolamine, Montelukast, Myrbetriq and Nitroglycerin   PROCEDURE:  This is a multichannel digital polysomnogram utilizing the SomnoStar 11.2 system.  Electrodes and sensors were applied and monitored per AASM Specifications.   EEG, EOG, Chin and Limb EMG, were sampled at 200 Hz.  ECG, Snore and Nasal Pressure, Thermal Airflow, Respiratory Effort,  CPAP Flow and Pressure, Oximetry was sampled at 50 Hz. Digital video and audio were recorded.      BASELINE STUDY Lights Out was at 00:02 and Lights On at 06:17.  Total recording time (TRT) was 375.5 minutes, with a total sleep time (TST) of 256 minutes.   The patient's sleep latency was 54.5 minutes.  REM latency was 0 minutes.  The sleep efficiency was 68.2 %.     SLEEP ARCHITECTURE: WASO (Wake after sleep onset) was 74 minutes.  There were 29 minutes in Stage N1, 227 minutes Stage N2, 0 minutes Stage N3 and 0 minutes in Stage REM.  The percentage of Stage N1 was 11.3%, Stage N2 was 88.7%, Stage N3 was 0% and Stage R (REM sleep) was 0%.    RESPIRATORY ANALYSIS:  There were a total of 0 apneas. There were 20 hypopneas with a hypopnea index of 4.7 /hour. The patient also had 0 respiratory event related arousals (RERAs).    The total APNEA/HYPOPNEA INDEX (AHI) was 4.7/hour and the total RESPIRATORY DISTURBANCE INDEX was 4.7 /hour.  There was no REM sleep and 40 events in NREM. The REM AHI was 0 /hour, versus a non-REM AHI of 4.7. The patient spent 0 minutes of total sleep time in the supine position and 256 minutes in non-supine. The supine AHI was 0.0 versus a non-supine AHI of 4.7.  OXYGEN SATURATION & C02:  The Wake baseline 02 saturation was 94%, with the lowest being 84%. Time spent below 89% saturation equaled 189 minutes.  [] Average End Tidal CO2 during sleep was 34.3 torr.  During REM, the average End Tidal CO2 during sleep was 0 torr.  During NREM, the average End Tidal CO2 during  sleep was 34.3 torr.  Total sleep time greater than 40 torr was 0.40 minutes.   PERIODIC LIMB MOVEMENTS:   The patient had a total of 0 Periodic Limb Movements.  The arousals were noted as: 46 were spontaneous, 0 were associated with PLMs, and 20 were associated with respiratory events.  Audio and video analysis did not show any abnormal or unusual movements, behaviors, phonations or vocalizations.   The patient  took bathroom breaks. Snoring was noted. EKG was in keeping with normal sinus rhythm (NSR).   IMPRESSION:  1. Poor sleep efficiency - no PLMs, Apnea or cardiac arrhythmia.  2. There was significant hypoxemia, by duration and nadir.   RECOMMENDATIONS:  1. Avoid sedative-hypnotics which may worsen sleep apnea, alcohol and tobacco (as applicable). 2. Advise to lose weight, diet and exercise if not contraindicated (BMI 32). 3. Consider cardiopulmonary evaluation if not previously performed. This patient may benefit from oxygen at night, but was not titrated in study.  4. Consider dedicated sleep psychology referral if insomnia is of clinical concern.   5. A follow up appointment will be scheduled in the Sleep Clinic at [facility]. The referring provider will be notified of the results.      I certify that I have reviewed the entire raw data recording prior to the issuance of this report in accordance with the Standards of Accreditation of the American Academy of Sleep Medicine (AASM)    Larey Seat, MD  05-31-2017 Diplomat, American Board of Psychiatry and Neurology  Diplomat, American Board of [place] Director, [facility] [employer]

## 2017-06-03 ENCOUNTER — Telehealth: Payer: Self-pay | Admitting: Neurology

## 2017-06-03 NOTE — Telephone Encounter (Signed)
Called patient with sleep study results. Made patient aware that Dr Brett Fairy didn't see any sleep apnea however she did have some issues with her oxygen levels dropping. I went over the sleep recommendations with the patient as listed by Dr Brett Fairy but expressed she should see a pulmonologist for her low oxygenation issues. I have sent a copy of her sleep study result to her PCP so that if he wants to order a referral to a pulmonologist they can. Pt verbalized understanding and had no further questions.

## 2017-06-03 NOTE — Telephone Encounter (Signed)
-----   Message from Larey Seat, MD sent at 06/02/2017  8:03 AM EDT ----- There was hypoxemia and multiple spontaneous arousals, poor sleep efficiency ( a form of insomnia) , please consider pulmonary evaluation for possible oxygen at night. CD Cc Dr Philip Aspen, GMA

## 2017-06-04 ENCOUNTER — Other Ambulatory Visit: Payer: Self-pay

## 2017-06-04 MED ORDER — DICYCLOMINE HCL 10 MG PO CAPS: 10.0000 mg | ORAL_CAPSULE | Freq: Three times a day (TID) | ORAL | 1 refills | Status: DC

## 2017-06-10 ENCOUNTER — Ambulatory Visit (INDEPENDENT_AMBULATORY_CARE_PROVIDER_SITE_OTHER): Payer: Medicare Other | Admitting: Emergency Medicine

## 2017-06-10 ENCOUNTER — Encounter: Payer: Self-pay | Admitting: Emergency Medicine

## 2017-06-10 DIAGNOSIS — J449 Chronic obstructive pulmonary disease, unspecified: Secondary | ICD-10-CM | POA: Diagnosis not present

## 2017-06-10 DIAGNOSIS — R918 Other nonspecific abnormal finding of lung field: Secondary | ICD-10-CM

## 2017-06-10 DIAGNOSIS — R590 Localized enlarged lymph nodes: Secondary | ICD-10-CM | POA: Diagnosis not present

## 2017-06-10 DIAGNOSIS — R042 Hemoptysis: Secondary | ICD-10-CM

## 2017-06-10 DIAGNOSIS — Z720 Tobacco use: Secondary | ICD-10-CM | POA: Diagnosis not present

## 2017-06-10 LAB — PULMONARY FUNCTION TEST
DL/VA % pred: 75 %
DL/VA: 3.32 ml/min/mmHg/L
DLCO COR % PRED: 59 %
DLCO cor: 11.98 ml/min/mmHg
DLCO unc % pred: 61 %
DLCO unc: 12.37 ml/min/mmHg
FEF 25-75 POST: 1.95 L/s
FEF 25-75 Pre: 1.86 L/sec
FEF2575-%Change-Post: 4 %
FEF2575-%Pred-Post: 112 %
FEF2575-%Pred-Pre: 107 %
FEV1-%CHANGE-POST: 0 %
FEV1-%Pred-Post: 86 %
FEV1-%Pred-Pre: 87 %
FEV1-PRE: 1.74 L
FEV1-Post: 1.73 L
FEV1FVC-%CHANGE-POST: 1 %
FEV1FVC-%Pred-Pre: 108 %
FEV6-%Change-Post: -2 %
FEV6-%PRED-PRE: 84 %
FEV6-%Pred-Post: 82 %
FEV6-POST: 2.07 L
FEV6-PRE: 2.11 L
FEV6FVC-%PRED-POST: 105 %
FEV6FVC-%PRED-PRE: 105 %
FVC-%CHANGE-POST: -2 %
FVC-%PRED-POST: 78 %
FVC-%PRED-PRE: 80 %
FVC-POST: 2.08 L
FVC-PRE: 2.12 L
POST FEV6/FVC RATIO: 100 %
PRE FEV6/FVC RATIO: 100 %
Post FEV1/FVC ratio: 83 %
Pre FEV1/FVC ratio: 82 %
RV % PRED: 92 %
RV: 1.88 L
TLC % PRED: 89 %
TLC: 4.14 L

## 2017-06-10 NOTE — Assessment & Plan Note (Signed)
Pulmonary function testing does not show significant obstruction. Given this and in absence of symptoms I will not start scheduled bronchodilators today. Most important thing is smoking cessation.

## 2017-06-10 NOTE — Assessment & Plan Note (Signed)
Discussed cessation with her today. He has nicotine patches, nicotine gum and is prepared for a quit date 06/11/17

## 2017-06-10 NOTE — Patient Instructions (Addendum)
Your pulmonary function testing does not show get COPD. This is good news. Most important thing for you to do with this point is to work hard on stopping smoking. We will repeat your CT chest in November 2018.  Follow with Dr Lamonte Sakai in November after your CT scan of the chest to review the results together.

## 2017-06-10 NOTE — Assessment & Plan Note (Signed)
Resolved. No clear source Identified on bronchoscopy.

## 2017-06-10 NOTE — Assessment & Plan Note (Signed)
Negative by endobronchial ultrasound

## 2017-06-10 NOTE — Progress Notes (Signed)
Subjective:    Patient ID: [name], female    DOB: Feb 08, 1946, 71 y.o.   MRN: [id number]  HPI 71 year old current every day smoker (78 pk-yrs) with a history of coronary disease, diabetes, hiatal hernia and GERD, pancreatic insufficiency followed by gastroenterology, PVD and carotid disease. She is referred for hemoptysis and an abnormal CT scan chest. She had a single episode of blood mixed in with her clear mucous in mid March. No other sx, no CP, no additional SOB. No epistaxis. She saw her PCP, had a CXR, then CT scan as below. The mucous went back to clear. She is working on decreasing her cigarettes, goal to quit. She has dyspnea w exertion, with taking out garbage, etc.   She tells me that she has had a daily cough for several years, productive of clear phlegm, happens more in the am and in the evenings when she lays flat. Does not wake her from sleep. She is having a lot of nasal gtt, and congestion, uses OTC allergy meds prn.   CT scan Chest 01/02/17 was reviewed by me >> shows several scattered small rounded nodules that all measure below 36mm. R hilar, L hilar enlarged nodes, 50mm AP window node.   Father had COPD, no family members w lung CA.   ROV 06/10/17 -- This follow-up visit for patient with a history of tobacco use first seen by me in May 2018 with cough and hemoptysis. CT chest showed mediastinal and hilar lymphadenopathy. She underwent endobronchial ultrasound bronchoscopy on 02/24/17. No source of bleeding was identified. Nodal biopsies at stations 7, 10 R, 10L were all negative. She underwent pulmonary function testing today that I have personally reviewed. This shows normal airflows without a bronchodilator response, normal lung volumes, decreased DLCO that does not correct the normal range when adjusted for alveolar volume. She feels that her breathing is OK - sometimes gets dyspnea with heavier activity, long walks. She is smoking about 1 pk/day . She is planning to try to  stop - has nicotine gum and patches, preparing to go cold [place] on 8/29.    Review of Systems  Constitutional: Negative for fever and unexpected weight change.  HENT: Positive for sneezing. Negative for congestion, dental problem, ear pain, nosebleeds, postnasal drip, rhinorrhea, sinus pressure, sore throat and trouble swallowing.   Eyes: Negative for redness and itching.  Respiratory: Positive for cough and shortness of breath. Negative for chest tightness and wheezing.   Cardiovascular: Negative for palpitations and leg swelling.  Gastrointestinal: Negative for nausea and vomiting.  Genitourinary: Negative for dysuria.  Musculoskeletal: Negative for joint swelling.  Skin: Negative for rash.  Neurological: Negative for headaches.  Hematological: Does not bruise/bleed easily.  Psychiatric/Behavioral: Negative for dysphoric mood. The patient is not nervous/anxious.     Past Medical History:  Diagnosis Date  . Anxiety   . Arthropathy, unspecified, site unspecified   . Blind loop syndrome   . Carotid artery disease ([place])    documented occlusion of left ICA  . Coronary artery disease   . Depression   . Diabetes insipidus ([place])   . Diabetes mellitus   . Diverticulosis   . Dizziness    chronic  . Dyslipidemia   . Dyspnea    with exertion  . Fatty liver   . GERD (gastroesophageal reflux disease)    hiatial hernia  . Hepatomegaly   . Hiatal hernia   . HTN (hypertension)   . Hyperlipidemia   . Hypertension   .  Irritable bowel syndrome   . Melanosis coli   . Metatarsal fracture right  . Normal nuclear stress test    nml 01/03/10;06/19/07  . Other, mixed, or unspecified nondependent drug abuse, unspecified   . Pancreatitis, chronic ([place])   . Peripheral vascular disease ([place]) 02/20/10   s/p stent of left SFA  . Sleep apnea   . Stroke ([place])   . Thyroid nodule   . Unspecified essential hypertension   . Vitamin B12 deficiency      Family History  Problem Relation Age of  Onset  . Hypertension Mother   . Osteoarthritis Mother   . Pulmonary embolism Mother   . Hypertension Father        aneurysm  . Stroke Father   . Stroke Unknown   . Colon cancer Paternal Grandfather      Social History   Social History  . Marital status: Divorced    Spouse name: N/A  . Number of children: 2  . Years of education: N/A   Occupational History  . retired Disabled   Social History Main Topics  . Smoking status: Current Some Day Smoker    Packs/day: 1.00    Years: 50.00    Types: Cigarettes    Last attempt to quit: 10/14/2010  . Smokeless tobacco: Never Used  . Alcohol use No  . Drug use: No  . Sexual activity: Not on file   Other Topics Concern  . Not on file   Social History Narrative  . No narrative on file     Allergies  Allergen Reactions  . Codeine     Altered mental state   . Iodinated Diagnostic Agents Rash     Outpatient Medications Prior to Visit  Medication Sig Dispense Refill  . ABILIFY 5 MG tablet Take 5 mg by mouth daily.     Marland Kitchen amLODipine (NORVASC) 10 MG tablet Take 10 mg by mouth daily.      . ASPERCREME HEAT 10 % GEL Apply 1 application topically as needed (arthritis).   12  . aspirin EC 81 MG tablet Take 81 mg by mouth every evening.    Marland Kitchen atenolol (TENORMIN) 50 MG tablet Take 50 mg by mouth 2 (two) times daily.     Marland Kitchen atorvastatin (LIPITOR) 20 MG tablet Take 20 mg by mouth daily.      Marland Kitchen azithromycin (ZITHROMAX) 250 MG tablet Take 2 today, then 1 daily until gone. 6 tablet 0  . clopidogrel (PLAVIX) 75 MG tablet Take 1 tablet (75 mg total) by mouth daily. Restart this medication on Wednesday 02/26/17. 30 tablet 3  . Cyanocobalamin (VITAMIN B-12 PO) Take 1 tablet by mouth once a week.     . dicyclomine (BENTYL) 10 MG capsule Take 1 capsule (10 mg total) by mouth 4 (four) times daily -  before meals and at bedtime. 360 capsule 1  . diphenhydrAMINE (BENADRYL) 50 MG tablet Take 1 tablet (50 mg total) by mouth daily as needed for allergies.     Marland Kitchen esomeprazole (NEXIUM) 40 MG capsule Take 40 mg by mouth 2 (two) times daily.      . furosemide (LASIX) 20 MG tablet Take 20 mg by mouth daily.  3  . gabapentin (NEURONTIN) 300 MG capsule Take 300 mg by mouth 2 (two) times daily.     Marland Kitchen HYDROcodone-acetaminophen (NORCO/VICODIN) 5-325 MG tablet Take 1 tablet by mouth 4 (four) times daily as needed for pain.  0  . ibuprofen (ADVIL,MOTRIN) 200 MG tablet Take 200-400  mg by mouth daily as needed for headache or moderate pain.    . isosorbide mononitrate (IMDUR) 60 MG 24 hr tablet Take 1 tablet (60 mg total) by mouth daily. 30 tablet 3  . lisinopril (PRINIVIL,ZESTRIL) 40 MG tablet Take 40 mg by mouth daily.      . methscopolamine (PAMINE FORTE) 5 MG tablet Take 5 mg by mouth at bedtime.      . metoprolol succinate (TOPROL-XL) 100 MG 24 hr tablet Take 100 mg by mouth daily.  12  . montelukast (SINGULAIR) 10 MG tablet Take 10 mg by mouth daily.      Marland Kitchen MYRBETRIQ 50 MG TB24 tablet Take 50 mg by mouth daily.  11  . nitroGLYCERIN (NITROSTAT) 0.4 MG SL tablet Place 1 tablet (0.4 mg total) under the tongue every 5 (five) minutes as needed for chest pain. 90 tablet 3  . ondansetron (ZOFRAN) 4 MG/5ML solution Take 4 mg by mouth every 8 (eight) hours as needed for nausea or vomiting. Reported on 11/22/2015    . ONETOUCH VERIO test strip     . Pancrelipase, Lip-Prot-Amyl, (ZENPEP) 25000-79000 units CPEP Take 2 capsules by mouth 3 (three) times daily before meals. And with snacks 300 capsule 5  . rOPINIRole (REQUIP) 0.5 MG tablet Take 0.5 mg by mouth at bedtime as needed (restless legs).   6  . venlafaxine (EFFEXOR-XR) 150 MG 24 hr capsule Take 150 mg by mouth daily.      Marland Kitchen VICTOZA 18 MG/3ML SOPN Inject 1.8 mg into the skin every evening.   4  . zolpidem (AMBIEN) 10 MG tablet Take 10 mg by mouth at bedtime as needed for sleep.  3   Facility-Administered Medications Prior to Visit  Medication Dose Route Frequency Provider Last Rate Last Dose  . 0.9 %  sodium  chloride infusion  500 mL Intravenous Continuous Ladene Artist, MD            Objective:   Physical Exam Vitals:   06/10/17 1558  BP: 132/86  Pulse: 75  SpO2: 97%  Weight: 178 lb (80.7 kg)  Height: 5\' 2"  (1.575 m)   Gen: Pleasant, obese woman, well-nourished, in no distress,  normal affect  ENT: No lesions,  mouth clear,  oropharynx clear, no postnasal drip  Neck: No JVD, no TMG, no carotid bruits  Lungs: No use of accessory muscles, clear without rales or rhonchi  Cardiovascular: RRR, heart sounds normal, no murmur or gallops, no peripheral edema  Musculoskeletal: No deformities, no cyanosis or clubbing  Neuro: alert, non focal  Skin: bilateral feet are dusky, cool      Assessment & Plan:  COPD (chronic obstructive pulmonary disease) (HCC) Pulmonary function testing does not show significant obstruction. Given this and in absence of symptoms I will not start scheduled bronchodilators today. Most important thing is smoking cessation.  Hilar lymphadenopathy Negative by endobronchial ultrasound  Hemoptysis Resolved. No clear source Identified on bronchoscopy.  Pulmonary nodules/lesions, multiple Repeat CT scan of the chest planned for November 2018  Tobacco use Discussed cessation with her today. He has nicotine patches, nicotine gum and is prepared for a quit date 06/11/17  Baltazar Apo, MD, PhD 06/10/2017, 4:23 PM [facility] [phone] or if no answer [phone]

## 2017-06-10 NOTE — Assessment & Plan Note (Signed)
Repeat CT scan of the chest planned for November 2018

## 2017-06-10 NOTE — Progress Notes (Signed)
PFT completed today 06/10/17.

## 2017-06-26 ENCOUNTER — Encounter: Payer: Self-pay | Admitting: Student

## 2017-06-26 NOTE — Progress Notes (Deleted)
Cardiology Office Note    Date:  06/26/2017   ID:  [name] 04/07/1946, MRN [id number]  PCP:  Joanna Battles, MD  Cardiologist: Dr. Martinique   No chief complaint on file.   History of Present Illness:    [name] is a 71 y.o. female with past medical history of CAD (known 60-70% mid-LAD stenosis with caths in 1999, 2002, 2006, and 2012 showing stable anatomy, low-risk NST in 10/2015), carotid artery disease (known occlusion of LICA), PAD (s/p stent to L SFA), HTN, HLD, Type 2 DM, GERD, COPD, tobacco use, and chronic pancreatitis who presents to the office today for 10-month follow-up.   She was last examined by Joanna Deforest, PA-C in 04/2017 and denied any recent chest pain or dyspnea on exertion. Upon review of medications, she was on both Atenolol and Toprol-XL per records (unsure if taking both) with BP elevated to 166/100. Her Imdur was increased to 60mg  daily and she was encouraged to check her BP and keep a recording of her readings.   Past Medical History:  Diagnosis Date  . Anxiety   . Arthropathy, unspecified, site unspecified   . Blind loop syndrome   . Carotid artery disease ([place])    documented occlusion of left ICA  . Coronary artery disease    a. known 60-70% mid-LAD stenosis with caths in 1999, 2002, 2006, and 2012 showing stable anatomy b. low-risk NST in 10/2015  . Depression   . Diabetes insipidus ([place])   . Diabetes mellitus   . Diverticulosis   . Dizziness    chronic  . Dyslipidemia   . Dyspnea    with exertion  . Fatty liver   . GERD (gastroesophageal reflux disease)    hiatial hernia  . Hepatomegaly   . Hiatal hernia   . HTN (hypertension)   . Hyperlipidemia   . Hypertension   . Irritable bowel syndrome   . Melanosis coli   . Metatarsal fracture right  . Normal nuclear stress test    nml 01/03/10;06/19/07  . Other, mixed, or unspecified nondependent drug abuse, unspecified   . Pancreatitis, chronic ([place])   . Peripheral vascular disease  ([place]) 02/20/10   s/p stent of left SFA  . Sleep apnea   . Stroke ([place])   . Thyroid nodule   . Unspecified essential hypertension   . Vitamin B12 deficiency     Past Surgical History:  Procedure Laterality Date  . ABDOMINAL HYSTERECTOMY     partial  . AUGMENTATION MAMMAPLASTY     bilateral 1976 retro   . BREAST ENHANCEMENT SURGERY    . CARDIAC CATHETERIZATION  07/07/98;08/31/01;03/04/05   60 -70% LAD  . CATARACT EXTRACTION    . ENDOBRONCHIAL ULTRASOUND Bilateral 02/24/2017   Procedure: ENDOBRONCHIAL ULTRASOUND;  Surgeon: Collene Gobble, MD;  Location: [facility];  Service: Cardiopulmonary;  Laterality: Bilateral;  . EYE SURGERY Left Nov. 2016   Cataract  . EYE SURGERY Right 2001   Cataract  . FEMORAL ARTERY STENT     Left leg  . LUNG SURGERY  03/2017   Benign polyps removed  . PATELLA REALIGNMENT Left   . TONSILLECTOMY    . TOTAL ABDOMINAL HYSTERECTOMY    . WRIST SURGERY     right    Current Medications: Outpatient Medications Prior to Visit  Medication Sig Dispense Refill  . ABILIFY 5 MG tablet Take 5 mg by mouth daily.     Marland Kitchen amLODipine (NORVASC) 10 MG tablet Take 10 mg  by mouth daily.      . ASPERCREME HEAT 10 % GEL Apply 1 application topically as needed (arthritis).   12  . aspirin EC 81 MG tablet Take 81 mg by mouth every evening.    Marland Kitchen atenolol (TENORMIN) 50 MG tablet Take 50 mg by mouth 2 (two) times daily.     Marland Kitchen atorvastatin (LIPITOR) 20 MG tablet Take 20 mg by mouth daily.      Marland Kitchen azithromycin (ZITHROMAX) 250 MG tablet Take 2 today, then 1 daily until gone. 6 tablet 0  . clopidogrel (PLAVIX) 75 MG tablet Take 1 tablet (75 mg total) by mouth daily. Restart this medication on Wednesday 02/26/17. 30 tablet 3  . Cyanocobalamin (VITAMIN B-12 PO) Take 1 tablet by mouth once a week.     . dicyclomine (BENTYL) 10 MG capsule Take 1 capsule (10 mg total) by mouth 4 (four) times daily -  before meals and at bedtime. 360 capsule 1  . diphenhydrAMINE (BENADRYL) 50 MG tablet  Take 1 tablet (50 mg total) by mouth daily as needed for allergies.    Marland Kitchen esomeprazole (NEXIUM) 40 MG capsule Take 40 mg by mouth 2 (two) times daily.      . furosemide (LASIX) 20 MG tablet Take 20 mg by mouth daily.  3  . gabapentin (NEURONTIN) 300 MG capsule Take 300 mg by mouth 2 (two) times daily.     Marland Kitchen HYDROcodone-acetaminophen (NORCO/VICODIN) 5-325 MG tablet Take 1 tablet by mouth 4 (four) times daily as needed for pain.  0  . ibuprofen (ADVIL,MOTRIN) 200 MG tablet Take 200-400 mg by mouth daily as needed for headache or moderate pain.    . isosorbide mononitrate (IMDUR) 60 MG 24 hr tablet Take 1 tablet (60 mg total) by mouth daily. 30 tablet 3  . lisinopril (PRINIVIL,ZESTRIL) 40 MG tablet Take 40 mg by mouth daily.      . methscopolamine (PAMINE FORTE) 5 MG tablet Take 5 mg by mouth at bedtime.      . metoprolol succinate (TOPROL-XL) 100 MG 24 hr tablet Take 100 mg by mouth daily.  12  . montelukast (SINGULAIR) 10 MG tablet Take 10 mg by mouth daily.      Marland Kitchen MYRBETRIQ 50 MG TB24 tablet Take 50 mg by mouth daily.  11  . nitroGLYCERIN (NITROSTAT) 0.4 MG SL tablet Place 1 tablet (0.4 mg total) under the tongue every 5 (five) minutes as needed for chest pain. 90 tablet 3  . ondansetron (ZOFRAN) 4 MG/5ML solution Take 4 mg by mouth every 8 (eight) hours as needed for nausea or vomiting. Reported on 11/22/2015    . ONETOUCH VERIO test strip     . Pancrelipase, Lip-Prot-Amyl, (ZENPEP) 25000-79000 units CPEP Take 2 capsules by mouth 3 (three) times daily before meals. And with snacks 300 capsule 5  . rOPINIRole (REQUIP) 0.5 MG tablet Take 0.5 mg by mouth at bedtime as needed (restless legs).   6  . venlafaxine (EFFEXOR-XR) 150 MG 24 hr capsule Take 150 mg by mouth daily.      Marland Kitchen VICTOZA 18 MG/3ML SOPN Inject 1.8 mg into the skin every evening.   4  . zolpidem (AMBIEN) 10 MG tablet Take 10 mg by mouth at bedtime as needed for sleep.  3   Facility-Administered Medications Prior to Visit  Medication  Dose Route Frequency Provider Last Rate Last Dose  . 0.9 %  sodium chloride infusion  500 mL Intravenous Continuous Ladene Artist, MD  Allergies:   Codeine and Iodinated diagnostic agents   Social History   Social History  . Marital status: Divorced    Spouse name: N/A  . Number of children: 2  . Years of education: N/A   Occupational History  . retired Disabled   Social History Main Topics  . Smoking status: Current Some Day Smoker    Packs/day: 1.00    Years: 50.00    Types: Cigarettes    Last attempt to quit: 10/14/2010  . Smokeless tobacco: Never Used  . Alcohol use No  . Drug use: No  . Sexual activity: Not on file   Other Topics Concern  . Not on file   Social History Narrative  . No narrative on file     Family History:  The patient's family history includes Colon cancer in her paternal grandfather; Hypertension in her father and mother; Osteoarthritis in her mother; Pulmonary embolism in her mother; Stroke in her father and unknown relative.   Review of Systems:   Please see the history of present illness.     General:  No chills, fever, night sweats or weight changes.  Cardiovascular:  No chest pain, dyspnea on exertion, edema, orthopnea, palpitations, paroxysmal nocturnal dyspnea. Dermatological: No rash, lesions/masses Respiratory: No cough, dyspnea Urologic: No hematuria, dysuria Abdominal:   No nausea, vomiting, diarrhea, bright red blood per rectum, melena, or hematemesis Neurologic:  No visual changes, wkns, changes in mental status. All other systems reviewed and are otherwise negative except as noted above.   Physical Exam:    VS:  There were no vitals taken for this visit.   General: Well developed, well nourished,female appearing in no acute distress. Head: Normocephalic, atraumatic, sclera non-icteric, no xanthomas, nares are without discharge.  Neck: No carotid bruits. JVD not elevated.  Lungs: Respirations regular and unlabored,  without wheezes or rales.  Heart: ***Regular rate and rhythm. No S3 or S4.  No murmur, no rubs, or gallops appreciated. Abdomen: Soft, non-tender, non-distended with normoactive bowel sounds. No hepatomegaly. No rebound/guarding. No obvious abdominal masses. Msk:  Strength and tone appear normal for age. No joint deformities or effusions. Extremities: No clubbing or cyanosis. No edema.  Distal pedal pulses are 2+ bilaterally. Neuro: Alert and oriented X 3. Moves all extremities spontaneously. No focal deficits noted. Psych:  Responds to questions appropriately with a normal affect. Skin: No rashes or lesions noted  Wt Readings from Last 3 Encounters:  06/10/17 178 lb (80.7 kg)  05/07/17 178 lb (80.7 kg)  05/01/17 185 lb (83.9 kg)     Studies/Labs Reviewed:   EKG:  EKG is*** ordered today.  The ekg ordered today demonstrates ***  Recent Labs: 01/13/2017: ALT 15; BUN 12; Creatinine, Ser 0.91; Platelets 198.0; TSH 2.60 02/24/2017: Hemoglobin 13.9; Potassium 4.0; Sodium 139   Lipid Panel No results found for: CHOL, TRIG, HDL, CHOLHDL, VLDL, LDLCALC, LDLDIRECT  Additional studies/ records that were reviewed today include:   NST: 10/2015  There was no ST segment deviation noted during stress.  The study is normal.  This is a low risk study.  This study was note gated due to frequent PVCs. Therefore no ejection fraction information is available.   Carotid Dopplers: 11/2016   Assessment:    No diagnosis found.   Plan:   In order of problems listed above:  1.  CAD - she has a known 60-70% mid-LAD stenosis with caths in 1999, 2002, 2006, and 2012 showing stable anatomy. Most recent ischemic evaluation was a  NST in 10/2015 which was low-risk.  -   2. PAD - s/p stent to L SFA.  -   3. Carotid artery disease - known occlusion of LICA.  - most recent carotid dopplers in 30/7460 showed known LICA occlusion with 02-98% RICA stenosis. Followed by Vascular Surgery.   4.  HTN - BP is at *** during today's visit.  -   5. HLD - no previous Lipid Panel results available for review in Epic.  - remains on Atorvastatin 20 mg daily.   6. Type 2 DM  7. COPD/ Tobacco use - COPD is followed by Pulmonology. She continues to smoke ***   Medication Adjustments/Labs and Tests Ordered: Current medicines are reviewed at length with the patient today.  Concerns regarding medicines are outlined above.  Medication changes, Labs and Tests ordered today are listed in the Patient Instructions below. There are no Patient Instructions on file for this visit.   Signed, Erma Heritage, PA-C  06/26/2017 10:34 AM    [address], [place], [place]  [address] Phone: [phone]; Fax: [phone]  [address] [place], [place] [address] Phone: [phone]

## 2017-06-27 ENCOUNTER — Ambulatory Visit: Payer: Medicare Other | Admitting: Student

## 2017-07-21 ENCOUNTER — Telehealth: Payer: Self-pay | Admitting: Gastroenterology

## 2017-07-22 NOTE — Telephone Encounter (Signed)
Patient's questions answered about recall colonoscopy for January 2019.

## 2017-07-29 DIAGNOSIS — H8110 Benign paroxysmal vertigo, unspecified ear: Secondary | ICD-10-CM | POA: Insufficient documentation

## 2017-08-16 ENCOUNTER — Other Ambulatory Visit: Payer: Self-pay | Admitting: Physician Assistant

## 2017-08-18 ENCOUNTER — Ambulatory Visit (INDEPENDENT_AMBULATORY_CARE_PROVIDER_SITE_OTHER)
Admission: RE | Admit: 2017-08-18 | Discharge: 2017-08-18 | Disposition: A | Discharge status: Home / Self Care | Payer: Medicare Other | Source: Ambulatory Visit | Attending: Emergency Medicine | Admitting: Emergency Medicine

## 2017-08-18 DIAGNOSIS — R918 Other nonspecific abnormal finding of lung field: Secondary | ICD-10-CM

## 2017-08-18 NOTE — Telephone Encounter (Signed)
Please review for refill, Thanks !  

## 2017-08-18 NOTE — Telephone Encounter (Signed)
REFILL 

## 2017-10-14 ENCOUNTER — Emergency Department (HOSPITAL_COMMUNITY): Payer: Medicare Other

## 2017-10-14 ENCOUNTER — Other Ambulatory Visit: Payer: Self-pay

## 2017-10-14 ENCOUNTER — Inpatient Hospital Stay (HOSPITAL_COMMUNITY): Payer: Medicare Other

## 2017-10-14 ENCOUNTER — Encounter (HOSPITAL_COMMUNITY): Payer: Self-pay

## 2017-10-14 ENCOUNTER — Inpatient Hospital Stay (HOSPITAL_COMMUNITY)
Admission: EM | Admit: 2017-10-14 | Discharge: 2017-11-06 | DRG: 003 | Disposition: A | Discharge status: Other Acute Inpatient Hospital | Payer: Medicare Other | Attending: Internal Medicine | Admitting: Internal Medicine

## 2017-10-14 ENCOUNTER — Encounter (HOSPITAL_COMMUNITY)
Admission: EM | Disposition: A | Discharge status: Nursing Home (Includes ICF) | Payer: Self-pay | Source: Home / Self Care

## 2017-10-14 ENCOUNTER — Emergency Department (HOSPITAL_COMMUNITY): Payer: Medicare Other | Admitting: Certified Registered"

## 2017-10-14 DIAGNOSIS — E876 Hypokalemia: Secondary | ICD-10-CM | POA: Diagnosis not present

## 2017-10-14 DIAGNOSIS — G8191 Hemiplegia, unspecified affecting right dominant side: Secondary | ICD-10-CM | POA: Diagnosis not present

## 2017-10-14 DIAGNOSIS — M129 Arthropathy, unspecified: Secondary | ICD-10-CM | POA: Diagnosis present

## 2017-10-14 DIAGNOSIS — N189 Chronic kidney disease, unspecified: Secondary | ICD-10-CM

## 2017-10-14 DIAGNOSIS — Y848 Other medical procedures as the cause of abnormal reaction of the patient, or of later complication, without mention of misadventure at the time of the procedure: Secondary | ICD-10-CM | POA: Diagnosis not present

## 2017-10-14 DIAGNOSIS — I10 Essential (primary) hypertension: Secondary | ICD-10-CM | POA: Diagnosis present

## 2017-10-14 DIAGNOSIS — K55039 Acute (reversible) ischemia of large intestine, extent unspecified: Secondary | ICD-10-CM | POA: Diagnosis not present

## 2017-10-14 DIAGNOSIS — K7200 Acute and subacute hepatic failure without coma: Secondary | ICD-10-CM | POA: Diagnosis not present

## 2017-10-14 DIAGNOSIS — K631 Perforation of intestine (nontraumatic): Secondary | ICD-10-CM | POA: Diagnosis present

## 2017-10-14 DIAGNOSIS — K650 Generalized (acute) peritonitis: Secondary | ICD-10-CM | POA: Diagnosis not present

## 2017-10-14 DIAGNOSIS — E1136 Type 2 diabetes mellitus with diabetic cataract: Secondary | ICD-10-CM | POA: Diagnosis present

## 2017-10-14 DIAGNOSIS — N179 Acute kidney failure, unspecified: Secondary | ICD-10-CM | POA: Diagnosis not present

## 2017-10-14 DIAGNOSIS — J810 Acute pulmonary edema: Secondary | ICD-10-CM | POA: Diagnosis not present

## 2017-10-14 DIAGNOSIS — L02211 Cutaneous abscess of abdominal wall: Secondary | ICD-10-CM | POA: Diagnosis not present

## 2017-10-14 DIAGNOSIS — I639 Cerebral infarction, unspecified: Secondary | ICD-10-CM | POA: Diagnosis not present

## 2017-10-14 DIAGNOSIS — J95851 Ventilator associated pneumonia: Secondary | ICD-10-CM | POA: Diagnosis not present

## 2017-10-14 DIAGNOSIS — R6521 Severe sepsis with septic shock: Secondary | ICD-10-CM | POA: Diagnosis present

## 2017-10-14 DIAGNOSIS — Z90710 Acquired absence of both cervix and uterus: Secondary | ICD-10-CM

## 2017-10-14 DIAGNOSIS — Z930 Tracheostomy status: Secondary | ICD-10-CM | POA: Diagnosis not present

## 2017-10-14 DIAGNOSIS — E785 Hyperlipidemia, unspecified: Secondary | ICD-10-CM | POA: Diagnosis present

## 2017-10-14 DIAGNOSIS — T8149XA Infection following a procedure, other surgical site, initial encounter: Secondary | ICD-10-CM

## 2017-10-14 DIAGNOSIS — Z4659 Encounter for fitting and adjustment of other gastrointestinal appliance and device: Secondary | ICD-10-CM

## 2017-10-14 DIAGNOSIS — R109 Unspecified abdominal pain: Secondary | ICD-10-CM

## 2017-10-14 DIAGNOSIS — E870 Hyperosmolality and hypernatremia: Secondary | ICD-10-CM | POA: Diagnosis not present

## 2017-10-14 DIAGNOSIS — T148XXA Other injury of unspecified body region, initial encounter: Secondary | ICD-10-CM | POA: Diagnosis not present

## 2017-10-14 DIAGNOSIS — I119 Hypertensive heart disease without heart failure: Secondary | ICD-10-CM | POA: Diagnosis present

## 2017-10-14 DIAGNOSIS — Z720 Tobacco use: Secondary | ICD-10-CM | POA: Diagnosis present

## 2017-10-14 DIAGNOSIS — Y95 Nosocomial condition: Secondary | ICD-10-CM | POA: Diagnosis not present

## 2017-10-14 DIAGNOSIS — R29717 NIHSS score 17: Secondary | ICD-10-CM | POA: Diagnosis not present

## 2017-10-14 DIAGNOSIS — I2510 Atherosclerotic heart disease of native coronary artery without angina pectoris: Secondary | ICD-10-CM | POA: Diagnosis present

## 2017-10-14 DIAGNOSIS — E538 Deficiency of other specified B group vitamins: Secondary | ICD-10-CM | POA: Diagnosis present

## 2017-10-14 DIAGNOSIS — K559 Vascular disorder of intestine, unspecified: Secondary | ICD-10-CM | POA: Diagnosis present

## 2017-10-14 DIAGNOSIS — Z452 Encounter for adjustment and management of vascular access device: Secondary | ICD-10-CM

## 2017-10-14 DIAGNOSIS — N170 Acute kidney failure with tubular necrosis: Secondary | ICD-10-CM | POA: Diagnosis not present

## 2017-10-14 DIAGNOSIS — I672 Cerebral atherosclerosis: Secondary | ICD-10-CM | POA: Diagnosis present

## 2017-10-14 DIAGNOSIS — T888XXA Other specified complications of surgical and medical care, not elsewhere classified, initial encounter: Secondary | ICD-10-CM | POA: Diagnosis not present

## 2017-10-14 DIAGNOSIS — Z0189 Encounter for other specified special examinations: Secondary | ICD-10-CM

## 2017-10-14 DIAGNOSIS — L8990 Pressure ulcer of unspecified site, unspecified stage: Secondary | ICD-10-CM

## 2017-10-14 DIAGNOSIS — A419 Sepsis, unspecified organism: Secondary | ICD-10-CM | POA: Diagnosis not present

## 2017-10-14 DIAGNOSIS — G8918 Other acute postprocedural pain: Secondary | ICD-10-CM

## 2017-10-14 DIAGNOSIS — E43 Unspecified severe protein-calorie malnutrition: Secondary | ICD-10-CM | POA: Diagnosis not present

## 2017-10-14 DIAGNOSIS — E1151 Type 2 diabetes mellitus with diabetic peripheral angiopathy without gangrene: Secondary | ICD-10-CM

## 2017-10-14 DIAGNOSIS — K219 Gastro-esophageal reflux disease without esophagitis: Secondary | ICD-10-CM | POA: Diagnosis present

## 2017-10-14 DIAGNOSIS — Z8249 Family history of ischemic heart disease and other diseases of the circulatory system: Secondary | ICD-10-CM

## 2017-10-14 DIAGNOSIS — K769 Liver disease, unspecified: Secondary | ICD-10-CM | POA: Diagnosis present

## 2017-10-14 DIAGNOSIS — J9600 Acute respiratory failure, unspecified whether with hypoxia or hypercapnia: Secondary | ICD-10-CM | POA: Diagnosis not present

## 2017-10-14 DIAGNOSIS — J9620 Acute and chronic respiratory failure, unspecified whether with hypoxia or hypercapnia: Secondary | ICD-10-CM | POA: Diagnosis not present

## 2017-10-14 DIAGNOSIS — E872 Acidosis: Secondary | ICD-10-CM | POA: Diagnosis not present

## 2017-10-14 DIAGNOSIS — Z9889 Other specified postprocedural states: Secondary | ICD-10-CM

## 2017-10-14 DIAGNOSIS — F17210 Nicotine dependence, cigarettes, uncomplicated: Secondary | ICD-10-CM | POA: Diagnosis present

## 2017-10-14 DIAGNOSIS — K55049 Acute infarction of large intestine, extent unspecified: Secondary | ICD-10-CM | POA: Diagnosis not present

## 2017-10-14 DIAGNOSIS — J95821 Acute postprocedural respiratory failure: Secondary | ICD-10-CM | POA: Diagnosis not present

## 2017-10-14 DIAGNOSIS — D696 Thrombocytopenia, unspecified: Secondary | ICD-10-CM | POA: Diagnosis not present

## 2017-10-14 DIAGNOSIS — J449 Chronic obstructive pulmonary disease, unspecified: Secondary | ICD-10-CM | POA: Diagnosis present

## 2017-10-14 DIAGNOSIS — K861 Other chronic pancreatitis: Secondary | ICD-10-CM | POA: Diagnosis present

## 2017-10-14 DIAGNOSIS — Z823 Family history of stroke: Secondary | ICD-10-CM

## 2017-10-14 DIAGNOSIS — K651 Peritoneal abscess: Secondary | ICD-10-CM | POA: Diagnosis not present

## 2017-10-14 DIAGNOSIS — J45909 Unspecified asthma, uncomplicated: Secondary | ICD-10-CM | POA: Diagnosis present

## 2017-10-14 DIAGNOSIS — Z8673 Personal history of transient ischemic attack (TIA), and cerebral infarction without residual deficits: Secondary | ICD-10-CM

## 2017-10-14 DIAGNOSIS — T8143XA Infection following a procedure, organ and space surgical site, initial encounter: Secondary | ICD-10-CM

## 2017-10-14 DIAGNOSIS — D649 Anemia, unspecified: Secondary | ICD-10-CM

## 2017-10-14 DIAGNOSIS — G4730 Sleep apnea, unspecified: Secondary | ICD-10-CM | POA: Diagnosis present

## 2017-10-14 DIAGNOSIS — Z781 Physical restraint status: Secondary | ICD-10-CM

## 2017-10-14 DIAGNOSIS — K760 Fatty (change of) liver, not elsewhere classified: Secondary | ICD-10-CM | POA: Diagnosis present

## 2017-10-14 DIAGNOSIS — Z932 Ileostomy status: Secondary | ICD-10-CM

## 2017-10-14 DIAGNOSIS — Z6831 Body mass index (BMI) 31.0-31.9, adult: Secondary | ICD-10-CM

## 2017-10-14 DIAGNOSIS — R0602 Shortness of breath: Secondary | ICD-10-CM

## 2017-10-14 DIAGNOSIS — Z91041 Radiographic dye allergy status: Secondary | ICD-10-CM

## 2017-10-14 DIAGNOSIS — K439 Ventral hernia without obstruction or gangrene: Secondary | ICD-10-CM | POA: Diagnosis present

## 2017-10-14 DIAGNOSIS — Z885 Allergy status to narcotic agent status: Secondary | ICD-10-CM

## 2017-10-14 DIAGNOSIS — J9601 Acute respiratory failure with hypoxia: Secondary | ICD-10-CM

## 2017-10-14 DIAGNOSIS — Z978 Presence of other specified devices: Secondary | ICD-10-CM

## 2017-10-14 DIAGNOSIS — Z7982 Long term (current) use of aspirin: Secondary | ICD-10-CM

## 2017-10-14 DIAGNOSIS — E871 Hypo-osmolality and hyponatremia: Secondary | ICD-10-CM | POA: Diagnosis not present

## 2017-10-14 DIAGNOSIS — I6389 Other cerebral infarction: Secondary | ICD-10-CM | POA: Diagnosis not present

## 2017-10-14 DIAGNOSIS — Z9911 Dependence on respirator [ventilator] status: Secondary | ICD-10-CM

## 2017-10-14 DIAGNOSIS — I70213 Atherosclerosis of native arteries of extremities with intermittent claudication, bilateral legs: Secondary | ICD-10-CM | POA: Diagnosis present

## 2017-10-14 DIAGNOSIS — Z800 Family history of malignant neoplasm of digestive organs: Secondary | ICD-10-CM

## 2017-10-14 DIAGNOSIS — E8770 Fluid overload, unspecified: Secondary | ICD-10-CM | POA: Diagnosis not present

## 2017-10-14 DIAGNOSIS — R29810 Facial weakness: Secondary | ICD-10-CM | POA: Diagnosis not present

## 2017-10-14 DIAGNOSIS — Z7902 Long term (current) use of antithrombotics/antiplatelets: Secondary | ICD-10-CM

## 2017-10-14 DIAGNOSIS — L89152 Pressure ulcer of sacral region, stage 2: Secondary | ICD-10-CM | POA: Diagnosis not present

## 2017-10-14 DIAGNOSIS — F329 Major depressive disorder, single episode, unspecified: Secondary | ICD-10-CM | POA: Diagnosis present

## 2017-10-14 DIAGNOSIS — D6489 Other specified anemias: Secondary | ICD-10-CM | POA: Diagnosis not present

## 2017-10-14 DIAGNOSIS — E669 Obesity, unspecified: Secondary | ICD-10-CM | POA: Diagnosis present

## 2017-10-14 DIAGNOSIS — E1165 Type 2 diabetes mellitus with hyperglycemia: Secondary | ICD-10-CM | POA: Diagnosis not present

## 2017-10-14 HISTORY — PX: COLON RESECTION: SHX5231

## 2017-10-14 LAB — BASIC METABOLIC PANEL
Anion gap: 9 (ref 5–15)
Anion gap: 11 (ref 5–15)
BUN: 43 mg/dL — AB (ref 6–20)
BUN: 45 mg/dL — AB (ref 6–20)
CALCIUM: 7.5 mg/dL — AB (ref 8.9–10.3)
CHLORIDE: 105 mmol/L (ref 101–111)
CO2: 22 mmol/L (ref 22–32)
CO2: 21 mmol/L — ABNORMAL LOW (ref 22–32)
CREATININE: 1.43 mg/dL — AB (ref 0.44–1.00)
Calcium: 7.4 mg/dL — ABNORMAL LOW (ref 8.9–10.3)
Chloride: 103 mmol/L (ref 101–111)
Creatinine, Ser: 2.54 mg/dL — ABNORMAL HIGH (ref 0.44–1.00)
GFR calc Af Amer: 42 mL/min — ABNORMAL LOW (ref >60)
GFR calc Af Amer: 21 mL/min — ABNORMAL LOW (ref >60)
GFR calc non Af Amer: 36 mL/min — ABNORMAL LOW (ref >60)
GFR, EST NON AFRICAN AMERICAN: 18 mL/min — AB (ref >60)
GLUCOSE: 98 mg/dL (ref 65–99)
Glucose, Bld: 112 mg/dL — ABNORMAL HIGH (ref 65–99)
POTASSIUM: 3.8 mmol/L (ref 3.5–5.1)
Potassium: 3.3 mmol/L — ABNORMAL LOW (ref 3.5–5.1)
SODIUM: 135 mmol/L (ref 135–145)
Sodium: 136 mmol/L (ref 135–145)

## 2017-10-14 LAB — URINALYSIS, ROUTINE W REFLEX MICROSCOPIC
Bilirubin Urine: NEGATIVE
Glucose, UA: 50 mg/dL — AB
Ketones, ur: NEGATIVE mg/dL
LEUKOCYTES UA: NEGATIVE
NITRITE: NEGATIVE
PH: 6 (ref 5.0–8.0)
PROTEIN: 100 mg/dL — AB
Specific Gravity, Urine: 1.016 (ref 1.005–1.030)

## 2017-10-14 LAB — TROPONIN I
Troponin I: 0.07 ng/mL (ref <0.03)
Troponin I: 0.06 ng/mL (ref <0.03)

## 2017-10-14 LAB — CBC WITH DIFFERENTIAL/PLATELET
BASOS ABS: 0 10*3/uL (ref 0.0–0.1)
BASOS PCT: 0 %
BASOS PCT: 0 %
Basophils Absolute: 0 10*3/uL (ref 0.0–0.1)
Basophils Absolute: 0 10*3/uL (ref 0.0–0.1)
Basophils Relative: 0 %
EOS ABS: 0 10*3/uL (ref 0.0–0.7)
EOS ABS: 0 10*3/uL (ref 0.0–0.7)
EOS PCT: 0 %
Eosinophils Absolute: 0 10*3/uL (ref 0.0–0.7)
Eosinophils Relative: 0 %
Eosinophils Relative: 0 %
HCT: 47.9 % — ABNORMAL HIGH (ref 36.0–46.0)
HCT: 39 % (ref 36.0–46.0)
HCT: 37.9 % (ref 36.0–46.0)
HEMOGLOBIN: 16.8 g/dL — AB (ref 12.0–15.0)
HEMOGLOBIN: 12.9 g/dL (ref 12.0–15.0)
Hemoglobin: 12.2 g/dL (ref 12.0–15.0)
LYMPHS ABS: 2.1 10*3/uL (ref 0.7–4.0)
Lymphocytes Relative: 9 %
Lymphocytes Relative: 17 %
Lymphocytes Relative: 17 %
Lymphs Abs: 1.8 10*3/uL (ref 0.7–4.0)
Lymphs Abs: 1.6 10*3/uL (ref 0.7–4.0)
MCH: 29.7 pg (ref 26.0–34.0)
MCH: 28.5 pg (ref 26.0–34.0)
MCH: 28.4 pg (ref 26.0–34.0)
MCHC: 35.1 g/dL (ref 30.0–36.0)
MCHC: 33.1 g/dL (ref 30.0–36.0)
MCHC: 32.2 g/dL (ref 30.0–36.0)
MCV: 84.8 fL (ref 78.0–100.0)
MCV: 86.1 fL (ref 78.0–100.0)
MCV: 88.1 fL (ref 78.0–100.0)
Monocytes Absolute: 0.8 10*3/uL (ref 0.1–1.0)
Monocytes Absolute: 0.5 10*3/uL (ref 0.1–1.0)
Monocytes Absolute: 1 10*3/uL (ref 0.1–1.0)
Monocytes Relative: 4 %
Monocytes Relative: 5 %
Monocytes Relative: 8 %
NEUTROS PCT: 87 %
NEUTROS PCT: 78 %
Neutro Abs: 17.3 10*3/uL — ABNORMAL HIGH (ref 1.7–7.7)
Neutro Abs: 7.3 10*3/uL (ref 1.7–7.7)
Neutro Abs: 9.2 10*3/uL — ABNORMAL HIGH (ref 1.7–7.7)
Neutrophils Relative %: 75 %
PLATELETS: 141 10*3/uL — AB (ref 150–400)
Platelets: 210 10*3/uL (ref 150–400)
Platelets: 157 10*3/uL (ref 150–400)
RBC: 5.65 MIL/uL — AB (ref 3.87–5.11)
RBC: 4.53 MIL/uL (ref 3.87–5.11)
RBC: 4.3 MIL/uL (ref 3.87–5.11)
RDW: 15.1 % (ref 11.5–15.5)
RDW: 15.3 % (ref 11.5–15.5)
RDW: 15.5 % (ref 11.5–15.5)
WBC: 20 10*3/uL — AB (ref 4.0–10.5)
WBC: 9.4 10*3/uL (ref 4.0–10.5)
WBC: 12.3 10*3/uL — AB (ref 4.0–10.5)

## 2017-10-14 LAB — POCT I-STAT 7, (LYTES, BLD GAS, ICA,H+H)
ACID-BASE DEFICIT: 2 mmol/L (ref 0.0–2.0)
Bicarbonate: 22.3 mmol/L (ref 20.0–28.0)
Calcium, Ion: 1.07 mmol/L — ABNORMAL LOW (ref 1.15–1.40)
HEMATOCRIT: 31 % — AB (ref 36.0–46.0)
HEMOGLOBIN: 10.5 g/dL — AB (ref 12.0–15.0)
O2 SAT: 100 %
PH ART: 7.389 (ref 7.350–7.450)
PO2 ART: 239 mmHg — AB (ref 83.0–108.0)
Potassium: 4.6 mmol/L (ref 3.5–5.1)
Sodium: 137 mmol/L (ref 135–145)
TCO2: 23 mmol/L (ref 22–32)
pCO2 arterial: 36.9 mmHg (ref 32.0–48.0)

## 2017-10-14 LAB — BLOOD GAS, ARTERIAL
ACID-BASE DEFICIT: 0.8 mmol/L (ref 0.0–2.0)
Bicarbonate: 19.6 mmol/L — ABNORMAL LOW (ref 20.0–28.0)
DRAWN BY: 308601
O2 Content: 2 L/min
O2 SAT: 98.7 %
PATIENT TEMPERATURE: 37
pCO2 arterial: 23.8 mmHg — ABNORMAL LOW (ref 32.0–48.0)
pH, Arterial: 7.527 — ABNORMAL HIGH (ref 7.350–7.450)
pO2, Arterial: 205 mmHg — ABNORMAL HIGH (ref 83.0–108.0)

## 2017-10-14 LAB — I-STAT CHEM 8, ED
BUN: 37 mg/dL — AB (ref 6–20)
Calcium, Ion: 1.01 mmol/L — ABNORMAL LOW (ref 1.15–1.40)
Chloride: 98 mmol/L — ABNORMAL LOW (ref 101–111)
Creatinine, Ser: 1.2 mg/dL — ABNORMAL HIGH (ref 0.44–1.00)
Glucose, Bld: 189 mg/dL — ABNORMAL HIGH (ref 65–99)
HEMATOCRIT: 52 % — AB (ref 36.0–46.0)
Hemoglobin: 17.7 g/dL — ABNORMAL HIGH (ref 12.0–15.0)
Potassium: 3.4 mmol/L — ABNORMAL LOW (ref 3.5–5.1)
SODIUM: 136 mmol/L (ref 135–145)
TCO2: 20 mmol/L — AB (ref 22–32)

## 2017-10-14 LAB — COMPREHENSIVE METABOLIC PANEL
ALBUMIN: 2.8 g/dL — AB (ref 3.5–5.0)
ALT: 23 U/L (ref 14–54)
ANION GAP: 16 — AB (ref 5–15)
AST: 41 U/L (ref 15–41)
Alkaline Phosphatase: 125 U/L (ref 38–126)
BILIRUBIN TOTAL: 1.1 mg/dL (ref 0.3–1.2)
BUN: 39 mg/dL — ABNORMAL HIGH (ref 6–20)
CHLORIDE: 98 mmol/L — AB (ref 101–111)
CO2: 21 mmol/L — ABNORMAL LOW (ref 22–32)
Calcium: 9.1 mg/dL (ref 8.9–10.3)
Creatinine, Ser: 1.37 mg/dL — ABNORMAL HIGH (ref 0.44–1.00)
GFR calc Af Amer: 44 mL/min — ABNORMAL LOW (ref >60)
GFR, EST NON AFRICAN AMERICAN: 38 mL/min — AB (ref >60)
GLUCOSE: 186 mg/dL — AB (ref 65–99)
POTASSIUM: 3.3 mmol/L — AB (ref 3.5–5.1)
Sodium: 135 mmol/L (ref 135–145)
Total Protein: 6.9 g/dL (ref 6.5–8.1)

## 2017-10-14 LAB — LIPASE, BLOOD: Lipase: 17 U/L (ref 11–51)

## 2017-10-14 LAB — PROTIME-INR
INR: 1.38
PROTHROMBIN TIME: 16.8 s — AB (ref 11.4–15.2)

## 2017-10-14 LAB — PHOSPHORUS: Phosphorus: 2.9 mg/dL (ref 2.5–4.6)

## 2017-10-14 LAB — TRIGLYCERIDES: TRIGLYCERIDES: 349 mg/dL — AB (ref <150)

## 2017-10-14 LAB — LACTIC ACID, PLASMA
Lactic Acid, Venous: 3.2 mmol/L (ref 0.5–1.9)
Lactic Acid, Venous: 5.3 mmol/L (ref 0.5–1.9)

## 2017-10-14 LAB — I-STAT CG4 LACTIC ACID, ED
LACTIC ACID, VENOUS: 6.14 mmol/L — AB (ref 0.5–1.9)
LACTIC ACID, VENOUS: 4.38 mmol/L — AB (ref 0.5–1.9)

## 2017-10-14 LAB — MAGNESIUM: Magnesium: 1.2 mg/dL — ABNORMAL LOW (ref 1.7–2.4)

## 2017-10-14 LAB — MRSA PCR SCREENING: MRSA by PCR: NEGATIVE

## 2017-10-14 SURGERY — COLON RESECTION
Anesthesia: General

## 2017-10-14 MED ORDER — NOREPINEPHRINE BITARTRATE 1 MG/ML IV SOLN
0.0000 ug/min | INTRAVENOUS | Status: DC
  Filled 2017-10-14: qty 4

## 2017-10-14 MED ORDER — SUCCINYLCHOLINE CHLORIDE 200 MG/10ML IV SOSY
PREFILLED_SYRINGE | INTRAVENOUS | Status: DC | PRN
  Administered 2017-10-14: 140 mg via INTRAVENOUS

## 2017-10-14 MED ORDER — LACTATED RINGERS IV BOLUS (SEPSIS)
1000.0000 mL | Freq: Once | INTRAVENOUS | Status: AC
  Administered 2017-10-14: 1000 mL via INTRAVENOUS

## 2017-10-14 MED ORDER — LIDOCAINE 2% (20 MG/ML) 5 ML SYRINGE
INTRAMUSCULAR | Status: DC | PRN
  Administered 2017-10-14: 100 mg via INTRAVENOUS

## 2017-10-14 MED ORDER — PIPERACILLIN-TAZOBACTAM 3.375 G IVPB 30 MIN
3.3750 g | Freq: Once | INTRAVENOUS | Status: AC
  Administered 2017-10-14: 3.375 g via INTRAVENOUS
  Filled 2017-10-14: qty 50

## 2017-10-14 MED ORDER — FENTANYL CITRATE (PF) 100 MCG/2ML IJ SOLN
50.0000 ug | INTRAMUSCULAR | Status: DC | PRN
  Administered 2017-10-14: 50 ug via INTRAVENOUS

## 2017-10-14 MED ORDER — FENTANYL CITRATE (PF) 100 MCG/2ML IJ SOLN: 50.0000 ug | Freq: Once | INTRAMUSCULAR | Status: AC

## 2017-10-14 MED ORDER — LACTATED RINGERS IV SOLN
INTRAVENOUS | Status: DC
  Administered 2017-10-14 – 2017-10-15 (×3): via INTRAVENOUS

## 2017-10-14 MED ORDER — CHLORHEXIDINE GLUCONATE 0.12 % MT SOLN
15.0000 mL | Freq: Two times a day (BID) | OROMUCOSAL | Status: DC
  Administered 2017-10-14 (×2): 15 mL via OROMUCOSAL
  Filled 2017-10-14: qty 15

## 2017-10-14 MED ORDER — NOREPINEPHRINE 4 MG/250ML-% IV SOLN
0.0000 ug/min | INTRAVENOUS | Status: DC
  Administered 2017-10-14: 15 ug/min via INTRAVENOUS
  Administered 2017-10-14: 25 ug/min via INTRAVENOUS
  Administered 2017-10-15: 20 ug/min via INTRAVENOUS
  Administered 2017-10-15: 22 ug/min via INTRAVENOUS
  Administered 2017-10-15: 15 ug/min via INTRAVENOUS
  Administered 2017-10-15: 22 ug/min via INTRAVENOUS
  Administered 2017-10-15: 40 ug/min via INTRAVENOUS
  Administered 2017-10-15: 20 ug/min via INTRAVENOUS
  Administered 2017-10-16: 10 ug/min via INTRAVENOUS
  Administered 2017-10-16: 19 ug/min via INTRAVENOUS
  Filled 2017-10-14 (×12): qty 250

## 2017-10-14 MED ORDER — SODIUM CHLORIDE 0.9 % IV BOLUS (SEPSIS)
30.0000 mL/kg | Freq: Once | INTRAVENOUS | Status: AC
  Administered 2017-10-14: 1000 mL via INTRAVENOUS

## 2017-10-14 MED ORDER — HEPARIN SODIUM (PORCINE) 5000 UNIT/ML IJ SOLN
5000.0000 [IU] | Freq: Three times a day (TID) | INTRAMUSCULAR | Status: DC
  Administered 2017-10-15: 5000 [IU] via SUBCUTANEOUS
  Filled 2017-10-14: qty 1

## 2017-10-14 MED ORDER — MIDAZOLAM HCL 5 MG/ML IJ SOLN
1.0000 mg | INTRAMUSCULAR | Status: DC | PRN
Start: 2017-10-14 — End: 2017-10-20

## 2017-10-14 MED ORDER — FENTANYL CITRATE (PF) 250 MCG/5ML IJ SOLN
INTRAMUSCULAR | Status: AC
Start: 2017-10-14 — End: 2017-10-14
  Filled 2017-10-14: qty 5

## 2017-10-14 MED ORDER — ONDANSETRON HCL 4 MG/2ML IJ SOLN: 4.0000 mg | Freq: Once | INTRAMUSCULAR | Status: DC | PRN

## 2017-10-14 MED ORDER — MAGNESIUM SULFATE 4 GM/100ML IV SOLN
4.0000 g | Freq: Once | INTRAVENOUS | Status: AC
  Administered 2017-10-14: 4 g via INTRAVENOUS
  Filled 2017-10-14: qty 100

## 2017-10-14 MED ORDER — SODIUM CHLORIDE 0.9 % IV SOLN
25.0000 ug/h | INTRAVENOUS | Status: DC
  Administered 2017-10-14: 50 ug/h via INTRAVENOUS
  Administered 2017-10-15 – 2017-10-16 (×2): 100 ug/h via INTRAVENOUS
  Administered 2017-10-17 – 2017-10-18 (×2): 125 ug/h via INTRAVENOUS
  Administered 2017-10-19 (×2): 150 ug/h via INTRAVENOUS
  Filled 2017-10-14 (×7): qty 50

## 2017-10-14 MED ORDER — PROPOFOL 1000 MG/100ML IV EMUL
5.0000 ug/kg/min | INTRAVENOUS | Status: DC
  Administered 2017-10-14 – 2017-10-15 (×2): 25 ug/kg/min via INTRAVENOUS
  Administered 2017-10-15: 10 ug/kg/min via INTRAVENOUS
  Filled 2017-10-14 (×3): qty 100

## 2017-10-14 MED ORDER — SODIUM CHLORIDE 0.9 % IV SOLN: INTRAVENOUS | Status: DC

## 2017-10-14 MED ORDER — MIDAZOLAM HCL 2 MG/2ML IJ SOLN
INTRAMUSCULAR | Status: DC | PRN
  Administered 2017-10-14: 1.5 mg via INTRAVENOUS
  Administered 2017-10-14: .5 mg via INTRAVENOUS

## 2017-10-14 MED ORDER — LACTATED RINGERS IV BOLUS (SEPSIS): 1000.0000 mL | Freq: Once | INTRAVENOUS | Status: DC

## 2017-10-14 MED ORDER — FENTANYL CITRATE (PF) 250 MCG/5ML IJ SOLN
INTRAMUSCULAR | Status: DC | PRN
  Administered 2017-10-14: 75 ug via INTRAVENOUS
  Administered 2017-10-14: 25 ug via INTRAVENOUS
  Administered 2017-10-14: 50 ug via INTRAVENOUS

## 2017-10-14 MED ORDER — PANTOPRAZOLE SODIUM 40 MG IV SOLR
40.0000 mg | Freq: Every day | INTRAVENOUS | Status: DC
  Administered 2017-10-14 – 2017-10-15 (×2): 40 mg via INTRAVENOUS
  Filled 2017-10-14 (×2): qty 40

## 2017-10-14 MED ORDER — SODIUM CHLORIDE 0.9 % IV SOLN
INTRAVENOUS | Status: DC | PRN
  Administered 2017-10-14: 06:00:00 via INTRAVENOUS

## 2017-10-14 MED ORDER — MORPHINE SULFATE (PF) 4 MG/ML IV SOLN: 1.0000 mg | INTRAVENOUS | Status: DC | PRN

## 2017-10-14 MED ORDER — PROPOFOL 1000 MG/100ML IV EMUL
5.0000 ug/kg/min | INTRAVENOUS | Status: DC
  Administered 2017-10-14: 10 ug/kg/min via INTRAVENOUS

## 2017-10-14 MED ORDER — ROCURONIUM BROMIDE 10 MG/ML (PF) SYRINGE
PREFILLED_SYRINGE | INTRAVENOUS | Status: DC | PRN
  Administered 2017-10-14: 30 mg via INTRAVENOUS
  Administered 2017-10-14: 20 mg via INTRAVENOUS

## 2017-10-14 MED ORDER — DEXTROSE 5 % IV SOLN
INTRAVENOUS | Status: DC | PRN
  Administered 2017-10-14: 50 ug/min via INTRAVENOUS

## 2017-10-14 MED ORDER — PIPERACILLIN-TAZOBACTAM 3.375 G IVPB
3.3750 g | Freq: Three times a day (TID) | INTRAVENOUS | Status: DC
  Administered 2017-10-14 – 2017-10-15 (×3): 3.375 g via INTRAVENOUS
  Filled 2017-10-14 (×3): qty 50

## 2017-10-14 MED ORDER — ONDANSETRON 4 MG PO TBDP: 4.0000 mg | ORAL_TABLET | Freq: Four times a day (QID) | ORAL | Status: DC | PRN

## 2017-10-14 MED ORDER — LACTATED RINGERS IV BOLUS (SEPSIS)
2000.0000 mL | Freq: Once | INTRAVENOUS | Status: AC
  Administered 2017-10-14: 2000 mL via INTRAVENOUS

## 2017-10-14 MED ORDER — MIDAZOLAM HCL 2 MG/2ML IJ SOLN
INTRAMUSCULAR | Status: AC
  Filled 2017-10-14: qty 2

## 2017-10-14 MED ORDER — POTASSIUM CHLORIDE 10 MEQ/50ML IV SOLN
10.0000 meq | INTRAVENOUS | Status: AC
  Administered 2017-10-14 (×4): 10 meq via INTRAVENOUS
  Filled 2017-10-14 (×4): qty 50

## 2017-10-14 MED ORDER — ETOMIDATE 2 MG/ML IV SOLN
INTRAVENOUS | Status: DC | PRN
  Administered 2017-10-14: 10 mg via INTRAVENOUS

## 2017-10-14 MED ORDER — SODIUM CHLORIDE 0.9 % IJ SOLN
INTRAMUSCULAR | Status: AC
  Filled 2017-10-14: qty 50

## 2017-10-14 MED ORDER — 0.9 % SODIUM CHLORIDE (POUR BTL) OPTIME
TOPICAL | Status: DC | PRN
  Administered 2017-10-14: 2000 mL

## 2017-10-14 MED ORDER — LACTATED RINGERS IV BOLUS (SEPSIS)
500.0000 mL | Freq: Once | INTRAVENOUS | Status: AC
  Administered 2017-10-14: 500 mL via INTRAVENOUS

## 2017-10-14 MED ORDER — MEPERIDINE HCL 50 MG/ML IJ SOLN: 6.2500 mg | INTRAMUSCULAR | Status: DC | PRN

## 2017-10-14 MED ORDER — ACETAMINOPHEN 650 MG RE SUPP
650.0000 mg | Freq: Four times a day (QID) | RECTAL | Status: DC | PRN
  Administered 2017-10-14: 650 mg via RECTAL
  Filled 2017-10-14: qty 1

## 2017-10-14 MED ORDER — FENTANYL BOLUS VIA INFUSION
25.0000 ug | INTRAVENOUS | Status: DC | PRN
  Administered 2017-10-18: 25 ug via INTRAVENOUS
  Filled 2017-10-14 (×2): qty 25

## 2017-10-14 MED ORDER — SODIUM BICARBONATE 8.4 % IV SOLN
INTRAVENOUS | Status: AC
  Filled 2017-10-14: qty 100

## 2017-10-14 MED ORDER — VANCOMYCIN HCL IN DEXTROSE 1-5 GM/200ML-% IV SOLN
1000.0000 mg | Freq: Once | INTRAVENOUS | Status: AC
  Administered 2017-10-14: 1000 mg via INTRAVENOUS
  Filled 2017-10-14: qty 200

## 2017-10-14 MED ORDER — MORPHINE SULFATE (PF) 4 MG/ML IV SOLN
4.0000 mg | Freq: Once | INTRAVENOUS | Status: AC
  Administered 2017-10-14: 4 mg via INTRAVENOUS
  Filled 2017-10-14: qty 1

## 2017-10-14 MED ORDER — ONDANSETRON HCL 4 MG/2ML IJ SOLN
4.0000 mg | Freq: Four times a day (QID) | INTRAMUSCULAR | Status: DC | PRN
Start: 2017-10-14 — End: 2017-10-16

## 2017-10-14 MED ORDER — HYDROMORPHONE HCL 1 MG/ML IJ SOLN: 0.2500 mg | INTRAMUSCULAR | Status: DC | PRN

## 2017-10-14 MED ORDER — LACTATED RINGERS IV SOLN
INTRAVENOUS | Status: DC | PRN
Start: 2017-10-14 — End: 2017-10-14
  Administered 2017-10-14 (×2): via INTRAVENOUS

## 2017-10-14 MED ORDER — PROPOFOL 10 MG/ML IV BOLUS
INTRAVENOUS | Status: AC
  Filled 2017-10-14: qty 20

## 2017-10-14 MED ORDER — ORAL CARE MOUTH RINSE
15.0000 mL | Freq: Two times a day (BID) | OROMUCOSAL | Status: DC
  Administered 2017-10-14 – 2017-10-21 (×15): 15 mL via OROMUCOSAL

## 2017-10-14 SURGICAL SUPPLY — 41 items
ADH SKN CLS APL DERMABOND .7 (GAUZE/BANDAGES/DRESSINGS)
APPLIER CLIP 5 13 M/L LIGAMAX5 (MISCELLANEOUS)
APR CLP MED LRG 5 ANG JAW (MISCELLANEOUS)
BLADE EXTENDED COATED 6.5IN (ELECTRODE) IMPLANT
CLIP APPLIE 5 13 M/L LIGAMAX5 (MISCELLANEOUS) IMPLANT
COUNTER NEEDLE 20 DBL MAG RED (NEEDLE) ×2 IMPLANT
COVER MAYO STAND STRL (DRAPES) ×6 IMPLANT
DECANTER SPIKE VIAL GLASS SM (MISCELLANEOUS) ×2 IMPLANT
DERMABOND ADVANCED (GAUZE/BANDAGES/DRESSINGS)
DERMABOND ADVANCED .7 DNX12 (GAUZE/BANDAGES/DRESSINGS) IMPLANT
DRAPE LAPAROSCOPIC ABDOMINAL (DRAPES) ×2 IMPLANT
DRSG OPSITE POSTOP 4X10 (GAUZE/BANDAGES/DRESSINGS) IMPLANT
DRSG OPSITE POSTOP 4X6 (GAUZE/BANDAGES/DRESSINGS) IMPLANT
DRSG OPSITE POSTOP 4X8 (GAUZE/BANDAGES/DRESSINGS) IMPLANT
ELECT PENCIL ROCKER SW 15FT (MISCELLANEOUS) ×4 IMPLANT
ELECT REM PT RETURN 15FT ADLT (MISCELLANEOUS) ×2 IMPLANT
GAUZE SPONGE 2X2 8PLY STRL LF (GAUZE/BANDAGES/DRESSINGS) IMPLANT
GAUZE SPONGE 4X4 12PLY STRL (GAUZE/BANDAGES/DRESSINGS) IMPLANT
GLOVE BIO SURGEON STRL SZ7.5 (GLOVE) ×4 IMPLANT
GOWN STRL REUS W/TWL XL LVL3 (GOWN DISPOSABLE) ×8 IMPLANT
LEGGING LITHOTOMY PAIR STRL (DRAPES) ×2 IMPLANT
LIGASURE IMPACT 36 18CM CVD LR (INSTRUMENTS) ×1 IMPLANT
LUBRICANT JELLY K Y 4OZ (MISCELLANEOUS) IMPLANT
PACK COLON (CUSTOM PROCEDURE TRAY) ×2 IMPLANT
RELOAD PROXIMATE 75MM BLUE (ENDOMECHANICALS) ×4 IMPLANT
SPONGE ABDOMINAL VAC ABTHERA (MISCELLANEOUS) ×1 IMPLANT
SPONGE GAUZE 2X2 STER 10/PKG (GAUZE/BANDAGES/DRESSINGS)
STAPLER PROXIMATE 75MM BLUE (STAPLE) ×2 IMPLANT
STAPLER VISISTAT 35W (STAPLE) IMPLANT
SUT PDS AB 1 CTX 36 (SUTURE) IMPLANT
SUT PDS AB 1 TP1 96 (SUTURE) IMPLANT
SUT PROLENE 2 0 SH DA (SUTURE) ×2 IMPLANT
SUT SILK 2 0 (SUTURE) ×2
SUT SILK 2 0 SH CR/8 (SUTURE) ×2 IMPLANT
SUT SILK 2-0 18XBRD TIE 12 (SUTURE) ×1 IMPLANT
SUT SILK 3 0 (SUTURE) ×2
SUT SILK 3 0 SH CR/8 (SUTURE) ×3 IMPLANT
SUT SILK 3-0 18XBRD TIE 12 (SUTURE) ×1 IMPLANT
TOWEL OR NON WOVEN STRL DISP B (DISPOSABLE) ×2 IMPLANT
TRAY FOLEY W/METER SILVER 16FR (SET/KITS/TRAYS/PACK) IMPLANT
TUBING CONNECTING 10 (TUBING) ×1 IMPLANT

## 2017-10-14 NOTE — Anesthesia Procedure Notes (Signed)
Procedure Name: Intubation Date/Time: 10/14/2017 6:05 AM Performed by: Cynda Familia, CRNA Pre-anesthesia Checklist: Patient identified, Emergency Drugs available, Suction available and Patient being monitored Patient Re-evaluated:Patient Re-evaluated prior to induction Oxygen Delivery Method: Circle System Utilized Preoxygenation: Pre-oxygenation with 100% oxygen Induction Type: IV induction Ventilation: Mask ventilation without difficulty Laryngoscope Size: Miller and 2 Grade View: Grade III Tube type: Subglottic suction tube Tube size: 7.5 mm Number of attempts: 1 Airway Equipment and Method: Stylet and Oral airway Placement Confirmation: ETT inserted through vocal cords under direct vision,  positive ETCO2 and breath sounds checked- equal and bilateral Secured at: 21 cm Tube secured with: Tape Dental Injury: Teeth and Oropharynx as per pre-operative assessment  Comments: Smooth IV RSI by Conrad Malverne--- intubation AM CRNA atraumatic --- front teeth chipped prior to laryngoscopy-- teeth and mouth unchanged after intubation--- very limited mouth opening--- poor view-- consider difficult intubation--- no Glidescope used as per [employer]

## 2017-10-14 NOTE — Addendum Note (Signed)
Addendum  created 10/14/17 1057 by Lavina Hamman, CRNA   Charge Capture section accepted

## 2017-10-14 NOTE — Anesthesia Postprocedure Evaluation (Signed)
Anesthesia Post Note  Patient: [name]  Procedure(s) Performed: EXPLORATORY LAPAROTOMY, EXTENDED RIGHT COLECTOMY; APPLICATION OF ABDOMINAL VACUUM DRESSING (N/A )     Patient location during evaluation: SICU Anesthesia Type: General Level of consciousness: sedated Pain management: pain level controlled Vital Signs Assessment: post-procedure vital signs reviewed and stable Respiratory status: patient remains intubated per anesthesia plan Cardiovascular status: stable Postop Assessment: no apparent nausea or vomiting Anesthetic complications: no    Last Vitals:  Vitals:   10/14/17 0730 10/14/17 0800  BP:    Pulse: (!) 117 65  Resp: 18 18  Temp:  37.9 C  SpO2: 100% 100%    Last Pain:  Vitals:   10/14/17 0357  TempSrc: Rectal                 Clemie General DAVID

## 2017-10-14 NOTE — Progress Notes (Signed)
Pharmacy Antibiotic Note  [name] is a 72 y.o. female admitted on 2017-11-01 with sepsis 2nd dead bowel.  Pharmacy has been consulted for zosyn dosing. Vanc/zosyn given in ED at 0330 am.  1/1 OR - removal dead bowel/R colectomy, wound VAC WBC 12. Cr 1.37. Lactate 4.32, temp 101.7  Plan: Zosyn 3.375g IV q8h (4 hour infusion).  Height: 5\' 4"  (162.6 cm) Weight: 174 lb 6.1 oz (79.1 kg) IBW/kg (Calculated) : 54.7  Temp (24hrs), Avg:101.1 F (38.4 C), Min:99.3 F (37.4 C), Max:102 F (38.9 C)  Recent Labs  Lab November 01, 2017 0310 11-01-2017 0329 11/01/17 0508  WBC  --  20.0*  --   CREATININE 1.20* 1.37*  --   LATICACIDVEN 6.14*  --  4.38*    Estimated Creatinine Clearance: 38.9 mL/min (A) (by C-G formula based on SCr of 1.37 mg/dL (H)).    Allergies  Allergen Reactions  . Codeine     Altered mental state   . Iodinated Diagnostic Agents Rash   Antimicrobials this admission:  1/1 vanc x 1 0329 1/1 zosyn >>  Microbiology results:  1/1 Ucx>> 1/1 BCx2>>  Thank you for allowing pharmacy to be a part of this patient's care.  Eudelia Bunch, Pharm.D. [phone] 11-01-17 8:06 AM

## 2017-10-14 NOTE — Anesthesia Procedure Notes (Signed)
Central Venous Catheter Insertion Performed by: Lillia Abed, MD, anesthesiologist Start/End1/10/2017 6:50 AM, 10/14/2017 7:00 AM Patient location: OR. Preanesthetic checklist: patient identified, IV checked, risks and benefits discussed, surgical consent, monitors and equipment checked, pre-op evaluation, timeout performed and anesthesia consent Patient sedated Hand hygiene performed  and maximum sterile barriers used  Catheter size: 8 Fr Total catheter length 16. Central line was placed.Double lumen Procedure performed using ultrasound guided technique. Ultrasound Notes:anatomy identified, needle tip was noted to be adjacent to the nerve/plexus identified, no ultrasound evidence of intravascular and/or intraneural injection and image(s) printed for medical record Attempts: 1 Following insertion, dressing applied, line sutured and Biopatch. Post procedure assessment: blood return through all ports, free fluid flow and no air  Patient tolerated the procedure well with no immediate complications.

## 2017-10-14 NOTE — ED Triage Notes (Signed)
Pt BIB [facility] from her home. Per son, pt is altered. Pt is responsive to her name, but not answering triage questions. Tachypneaic and tachycardic in triage. Malodorous urine smell noted.

## 2017-10-14 NOTE — ED Notes (Signed)
30 mL/kg. Pt should get 2.5 L

## 2017-10-14 NOTE — Progress Notes (Signed)
MD made aware of patient's lab results. Verbal order for the following: 4 g magnesium sulfate, 10 meq potassium x4, 500 cc LR bolus. Made aware of climbing temperature with Lactic acid 3.2 which is less than the one checked before (4.38).

## 2017-10-14 NOTE — Anesthesia Preprocedure Evaluation (Addendum)
Anesthesia Evaluation  Patient identified by MRN, date of birth, ID band Patient awake    Reviewed: Allergy & Precautions, NPO status , Patient's Chart, lab work & pertinent test results  Airway Mallampati: I  TM Distance: >3 FB Neck ROM: Full    Dental  (+) Dental Advisory Given, Chipped   Pulmonary sleep apnea , COPD, Current Smoker,    Pulmonary exam normal        Cardiovascular hypertension, Pt. on medications + CAD  Normal cardiovascular exam     Neuro/Psych Anxiety Depression CVA    GI/Hepatic GERD  Medicated and Controlled,  Endo/Other  diabetes  Renal/GU      Musculoskeletal   Abdominal   Peds  Hematology   Anesthesia Other Findings   Reproductive/Obstetrics                            Anesthesia Physical Anesthesia Plan  ASA: III and emergent  Anesthesia Plan: General   Post-op Pain Management:    Induction: Intravenous, Rapid sequence and Cricoid pressure planned  PONV Risk Score and Plan: 2  Airway Management Planned: Oral ETT  Additional Equipment:   Intra-op Plan:   Post-operative Plan: Possible Post-op intubation/ventilation  Informed Consent: I have reviewed the patients History and Physical, chart, labs and discussed the procedure including the risks, benefits and alternatives for the proposed anesthesia with the patient or authorized representative who has indicated his/her understanding and acceptance.     Plan Discussed with: CRNA and Surgeon  Anesthesia Plan Comments:         Anesthesia Quick Evaluation

## 2017-10-14 NOTE — Transfer of Care (Signed)
Immediate Anesthesia Transfer of Care Note  Patient: [name]  Procedure(s) Performed: EXPLORATORY LAPAROTOMY, EXTENDED RIGHT COLECTOMY; APPLICATION OF ABDOMINAL VACUUM DRESSING (N/A )  Patient Location: ICU  Anesthesia Type:General  Level of Consciousness: sedated, unresponsive and Patient remains intubated per anesthesia plan  Airway & Oxygen Therapy: Patient remains intubated per anesthesia plan and Patient placed on Ventilator (see vital sign flow sheet for setting)  Post-op Assessment: Report given to RN  Post vital signs: Reviewed and stable  Last Vitals:  Vitals:   10/14/17 0445 10/14/17 0515  BP: 135/60 134/68  Pulse: (!) 143 (!) 135  Resp: (!) 42 (!) 35  Temp: (!) 38.9 C (!) 38.7 C  SpO2: 94% 95%    Last Pain:  Vitals:   10/14/17 0357  TempSrc: Rectal         Complications: No apparent anesthesia complications

## 2017-10-14 NOTE — Progress Notes (Signed)
eLink Physician-Brief Progress Note Patient Name: [name] DOB: 1946/01/01 MRN: [id number]   Date of Service  10/14/2017  HPI/Events of Note  Lactic acid up to 5.3.   eICU Interventions  Bolus 1L of LR, increase rate to 150 cc/hr.         Laverle Hobby 10/14/2017, 11:54 PM

## 2017-10-14 NOTE — ED Notes (Addendum)
Surgeon here. Consent at bedside. Pt jewelry and belonging with patient. No dentures. CHG bath performed.

## 2017-10-14 NOTE — ED Notes (Signed)
Pt transported to OR

## 2017-10-14 NOTE — ED Notes (Signed)
Bed: WA07 Expected date:  Expected time:  Means of arrival:  Comments: EMS 72 yo female from home altered mental status/semi responsive

## 2017-10-14 NOTE — Assessment & Plan Note (Signed)
Primary issue resulting in admit 10/14/2017   Rx per [facility]

## 2017-10-14 NOTE — ED Notes (Signed)
Pt transported to CT ?

## 2017-10-14 NOTE — Progress Notes (Addendum)
Patient's BP has slowing been decreasing but had been WDL, however dropped from 13'W systolic to 85'R systolic.  Chase Caller, MD made aware.  Orders placed for Levophed, 500 cc LR bolus, and for labs to be rechecked following electrolyte replacement at approximately 2200.  Will continue to monitor.

## 2017-10-14 NOTE — ED Notes (Signed)
MD made aware of pt temperature

## 2017-10-14 NOTE — ED Provider Notes (Signed)
[facility] DEPT Provider Note   CSN: [id number] Arrival date & time: 10/14/17  0226     History   Chief Complaint Chief Complaint  Patient presents with  . Altered Mental Status   Level 5 caveat: Altered mental status   HPI [name] is a 72 y.o. female.  HPI Patient is a 72 year old female who presents to the emergency department with altered mental status.  She presents tachycardic to the 150s and febrile to 101.5 with significant abdominal discomfort and pain.  Family states that she did not eat lunch today and seemed more confused this afternoon.  No history of abdominal surgery.  Patient is unable to provide any reasonable history at this time.   Past Medical History:  Diagnosis Date  . Anxiety   . Arthropathy, unspecified, site unspecified   . Blind loop syndrome   . Carotid artery disease ([place])    documented occlusion of left ICA  . Coronary artery disease    a. known 60-70% mid-LAD stenosis with caths in 1999, 2002, 2006, and 2012 showing stable anatomy b. low-risk NST in 10/2015  . Depression   . Diabetes insipidus ([place])   . Diabetes mellitus   . Diverticulosis   . Dizziness    chronic  . Dyslipidemia   . Dyspnea    with exertion  . Fatty liver   . GERD (gastroesophageal reflux disease)    hiatial hernia  . Hepatomegaly   . Hiatal hernia   . HTN (hypertension)   . Hyperlipidemia   . Hypertension   . Irritable bowel syndrome   . Melanosis coli   . Metatarsal fracture right  . Normal nuclear stress test    nml 01/03/10;06/19/07  . Other, mixed, or unspecified nondependent drug abuse, unspecified   . Pancreatitis, chronic ([place])   . Peripheral vascular disease ([place]) 02/20/10   s/p stent of left SFA  . Sleep apnea   . Stroke ([place])   . Thyroid nodule   . Unspecified essential hypertension   . Vitamin B12 deficiency     Patient Active Problem List   Diagnosis Date Noted  . Nocturia more than twice per night  05/07/2017  . Diabetes due to underlying condition w oth circulatory comp ([place]) 05/07/2017  . Atherosclerotic PVD with intermittent claudication ([place]) 05/07/2017  . COPD (chronic obstructive pulmonary disease) ([place]) 05/07/2017  . Poor sleep hygiene 05/07/2017  . 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 Hilar lymphadenopathy   . Hemoptysis 02/19/2017  . Pulmonary nodules/lesions, multiple 02/19/2017  . Tobacco use 02/19/2017  . RUQ abdominal pain 01/13/2017  . Pancreatic insufficiency 01/13/2017  . Left-sided extracranial carotid artery occlusion 11/26/2016  . PAOD (peripheral arterial occlusive disease) ([place]) 11/26/2016  . Encounter for routine follow-up after surgery of the circulatory system 11/26/2016  . Chest pain with moderate risk of acute coronary syndrome 10/27/2015  . Type 2 diabetes mellitus ([place]) 10/27/2015  . Occlusion and stenosis of carotid artery without mention of cerebral infarction 04/14/2012  . Chronic pancreatitis ([place]) 04/30/2011  . Vitamin B12 deficiency 04/30/2011  . 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 Fatty liver 04/30/2011  . Obesity 04/30/2011  . Abdominal pain 04/09/2011  . Iron deficiency anemia, unspecified  04/09/2011  . Other general symptoms  04/09/2011  . DM (diabetes mellitus screen) 04/09/2011  . Asthma 04/09/2011  . Coronary artery disease   . Dyslipidemia   . Dizziness   . Right-sided extracranial carotid artery stenosis   . Depression   . Peripheral  vascular disease ([place])   . Normal nuclear stress test   . B12 DEFICIENCY 03/19/2010  . DVT 03/16/2010  . BLIND LOOP SYNDROME 03/16/2010  . DIARRHEA 03/16/2010  . LAXATIVE ABUSE 12/17/2007  . Essential hypertension 12/17/2007  . GERD 12/17/2007  . HIATAL HERNIA 12/17/2007  . IBS 12/17/2007  . MELANOSIS COLI 12/17/2007  . ARTHRITIS 12/17/2007  . HEPATOMEGALY 12/17/2007    Past Surgical History:  Procedure Laterality Date  . ABDOMINAL HYSTERECTOMY     partial  . AUGMENTATION  MAMMAPLASTY     bilateral 1976 retro   . BREAST ENHANCEMENT SURGERY    . CARDIAC CATHETERIZATION  07/07/98;08/31/01;03/04/05   60 -70% LAD  . CATARACT EXTRACTION    . ENDOBRONCHIAL ULTRASOUND Bilateral 02/24/2017   Procedure: ENDOBRONCHIAL ULTRASOUND;  Surgeon: Collene Gobble, MD;  Location: [facility] ENDOSCOPY;  Service: Cardiopulmonary;  Laterality: Bilateral;  . EYE SURGERY Left Nov. 2016   Cataract  . EYE SURGERY Right 2001   Cataract  . FEMORAL ARTERY STENT     Left leg  . LUNG SURGERY  03/2017   Benign polyps removed  . PATELLA REALIGNMENT Left   . TONSILLECTOMY    . TOTAL ABDOMINAL HYSTERECTOMY    . WRIST SURGERY     right    OB History    No data available       Home Medications    Prior to Admission medications   Medication Sig Start Date End Date Taking? Authorizing Provider  ABILIFY 5 MG tablet Take 5 mg by mouth daily.  04/19/11   [provider]  amLODipine (NORVASC) 10 MG tablet Take 10 mg by mouth daily.      [provider]  ASPERCREME HEAT 10 % GEL Apply 1 application topically as needed (arthritis).  11/28/16   [provider]  aspirin EC 81 MG tablet Take 81 mg by mouth every evening.    [provider]  atenolol (TENORMIN) 50 MG tablet Take 50 mg by mouth 2 (two) times daily.     [provider]  atorvastatin (LIPITOR) 20 MG tablet Take 20 mg by mouth daily.      [provider]  azithromycin (ZITHROMAX) 250 MG tablet Take 2 today, then 1 daily until gone. 03/05/17   Magdalen Spatz, NP  clopidogrel (PLAVIX) 75 MG tablet Take 1 tablet (75 mg total) by mouth daily. Restart this medication on Wednesday 02/26/17. 02/24/17   Collene Gobble, MD  Cyanocobalamin (VITAMIN B-12 PO) Take 1 tablet by mouth once a week.     [provider]  dicyclomine (BENTYL) 10 MG capsule Take 1 capsule (10 mg total) by mouth 4 (four) times daily -  before meals and at bedtime. 06/04/17   Ladene Artist, MD  diphenhydrAMINE  (BENADRYL) 50 MG tablet Take 1 tablet (50 mg total) by mouth daily as needed for allergies. 02/24/17   Collene Gobble, MD  esomeprazole (NEXIUM) 40 MG capsule Take 40 mg by mouth 2 (two) times daily.      [provider]  furosemide (LASIX) 20 MG tablet Take 20 mg by mouth daily. 09/22/14   [provider]  gabapentin (NEURONTIN) 300 MG capsule Take 300 mg by mouth 2 (two) times daily.     [provider]  HYDROcodone-acetaminophen (NORCO/VICODIN) 5-325 MG tablet Take 1 tablet by mouth 4 (four) times daily as needed for pain. 01/31/17   [provider]  ibuprofen (ADVIL,MOTRIN) 200 MG tablet Take 200-400 mg by mouth  daily as needed for headache or moderate pain.    [provider]  isosorbide mononitrate (IMDUR) 60 MG 24 hr tablet TAKE 1 TABLET BY MOUTH EVERY DAY 08/18/17   Almyra Deforest, PA  lisinopril (PRINIVIL,ZESTRIL) 40 MG tablet Take 40 mg by mouth daily.      [provider]  methscopolamine (PAMINE FORTE) 5 MG tablet Take 5 mg by mouth at bedtime.      [provider]  metoprolol succinate (TOPROL-XL) 100 MG 24 hr tablet Take 100 mg by mouth daily. 01/24/17   [provider]  montelukast (SINGULAIR) 10 MG tablet Take 10 mg by mouth daily.      [provider]  MYRBETRIQ 50 MG TB24 tablet Take 50 mg by mouth daily. 10/10/14   [provider]  nitroGLYCERIN (NITROSTAT) 0.4 MG SL tablet Place 1 tablet (0.4 mg total) under the tongue every 5 (five) minutes as needed for chest pain. 11/08/15   Martin[place], Peter M, MD  ondansetron [facility]) 4 MG/5ML solution Take 4 mg by mouth every 8 (eight) hours as needed for nausea or vomiting. Reported on 11/22/2015 11/20/15   [provider]  [facility] VERIO test strip  11/20/15   [provider]  Pancrelipase, Lip-Prot-Amyl, (ZENPEP) 25000-79000 units CPEP Take 2 capsules by mouth 3 (three) times daily before meals. And with snacks 01/13/17   Ladene Artist, MD    rOPINIRole (REQUIP) 0.5 MG tablet Take 0.5 mg by mouth at bedtime as needed (restless legs).  11/28/16   [provider]  venlafaxine (EFFEXOR-XR) 150 MG 24 hr capsule Take 150 mg by mouth daily.      [provider]  VICTOZA 18 MG/3ML SOPN Inject 1.8 mg into the skin every evening.  10/06/15   [provider]  zolpidem (AMBIEN) 10 MG tablet Take 10 mg by mouth at bedtime as needed for sleep. 01/11/17   [provider]    Family History Family History  Problem Relation Age of Onset  . Hypertension Mother   . Osteoarthritis Mother   . Pulmonary embolism Mother   . Hypertension Father        aneurysm  . Stroke Father   . Stroke Unknown   . Colon cancer Paternal Grandfather     Social History Social History   Tobacco Use  . Smoking status: Current Some Day Smoker    Packs/day: 1.00    Years: 50.00    Pack years: 50.00    Types: Cigarettes    Last attempt to quit: 10/14/2010    Years since quitting: 7.0  . Smokeless tobacco: Never Used  Substance Use Topics  . Alcohol use: No  . Drug use: No     Allergies   Codeine and Iodinated diagnostic agents   Review of Systems Review of Systems  Unable to perform ROS: Mental status change     Physical Exam Updated Vital Signs BP (!) 141/115 (BP Location: Left Arm)   Pulse (!) 152   Resp (!) 30   SpO2 98%   Physical Exam  Constitutional: She is oriented to person, place, and time. She appears distressed.  HENT:  Head: Normocephalic and atraumatic.  Eyes: EOM are normal.  Neck: Normal range of motion.  Cardiovascular:  Tachycardic  Pulmonary/Chest:  Lungs are clear.  Patient is tachypneic.  Kussmaul breathing  Abdominal:  Diffuse tenderness throughout her abdomen.  Positive for peritoneal signs  Musculoskeletal: Normal range of motion.  Neurological: She is alert and oriented to  person, place, and time.  Skin: Skin is warm. She is diaphoretic.  Psychiatric: She has a normal mood  and affect. Judgment normal.  Nursing note and vitals reviewed.    ED Treatments / Results  Labs (all labs ordered are listed, but only abnormal results are displayed) Labs Reviewed  COMPREHENSIVE METABOLIC PANEL - Abnormal; Notable for the following components:      Result Value   Potassium 3.3 (*)    Chloride 98 (*)    CO2 21 (*)    Glucose, Bld 186 (*)    BUN 39 (*)    Creatinine, Ser 1.37 (*)    Albumin 2.8 (*)    GFR calc non Af Amer 38 (*)    GFR calc Af Amer 44 (*)    Anion gap 16 (*)    All other components within normal limits  CBC WITH DIFFERENTIAL/PLATELET - Abnormal; Notable for the following components:   WBC 20.0 (*)    RBC 5.65 (*)    Hemoglobin 16.8 (*)    HCT 47.9 (*)    Neutro Abs 17.3 (*)    All other components within normal limits  PROTIME-INR - Abnormal; Notable for the following components:   Prothrombin Time 16.8 (*)    All other components within normal limits  URINALYSIS, ROUTINE W REFLEX MICROSCOPIC - Abnormal; Notable for the following components:   Color, Urine AMBER (*)    Glucose, UA 50 (*)    Hgb urine dipstick SMALL (*)    Protein, ur 100 (*)    Bacteria, UA RARE (*)    Squamous Epithelial / LPF 0-5 (*)    All other components within normal limits  TROPONIN I - Abnormal; Notable for the following components:   Troponin I 0.07 (*)    All other components within normal limits  BLOOD GAS, ARTERIAL - Abnormal; Notable for the following components:   pH, Arterial 7.527 (*)    pCO2 arterial 23.8 (*)    pO2, Arterial 205 (*)    Bicarbonate 19.6 (*)    All other components within normal limits  I-STAT CG4 LACTIC ACID, ED - Abnormal; Notable for the following components:   Lactic Acid, Venous 6.14 (*)    All other components within normal limits  I-STAT CHEM 8, ED - Abnormal; Notable for the following components:   Potassium 3.4 (*)    Chloride 98 (*)    BUN 37 (*)    Creatinine, Ser 1.20 (*)    Glucose, Bld 189 (*)    Calcium, Ion  1.01 (*)    TCO2 20 (*)    Hemoglobin 17.7 (*)    HCT 52.0 (*)    All other components within normal limits  CULTURE, BLOOD (ROUTINE X 2)  CULTURE, BLOOD (ROUTINE X 2)  URINE CULTURE  LIPASE, BLOOD  I-STAT CG4 LACTIC ACID, ED    EKG  EKG Interpretation None       Radiology Ct Abdomen Pelvis Wo Contrast  Result Date: 10/14/2017 CLINICAL DATA:  Code sepsis. Tachycardia and decreased respiratory rate. Mid abdominal pain, acute onset. EXAM: CT CHEST, ABDOMEN AND PELVIS WITHOUT CONTRAST TECHNIQUE: Multidetector CT imaging of the chest, abdomen and pelvis was performed following the standard protocol without IV contrast. COMPARISON:  CT of the abdomen and pelvis performed 01/17/2017, and CT of the chest performed 08/18/2017 FINDINGS: CT CHEST FINDINGS Cardiovascular: The heart is normal in size. Scattered coronary artery calcifications are seen. Calcification is noted at the thoracic aorta and proximal great  vessels. Mediastinum/Nodes: The mediastinum is otherwise unremarkable in appearance. No mediastinal lymphadenopathy is seen. No pericardial effusion is identified. The visualized portions of the thyroid gland are unremarkable. No axillary lymphadenopathy is seen. Lungs/Pleura: Mild peripheral scarring is noted bilaterally. Mild hazy bibasilar airspace opacities may reflect pneumonia. No definite pleural effusion or pneumothorax is seen. No dominant mass is identified. Musculoskeletal: No acute osseous abnormalities are identified. The visualized musculature is unremarkable in appearance. Bilateral breast implants are noted. CT ABDOMEN PELVIS FINDINGS Hepatobiliary: The liver is unremarkable in appearance. The gallbladder is mildly distended. The common bile duct remains normal in caliber. Pancreas: The pancreas is within normal limits. Spleen: The spleen is unremarkable in appearance. Adrenals/Urinary Tract: The adrenal glands are unremarkable in appearance. Nonspecific perinephric stranding is  noted bilaterally. A nonobstructing 6 mm stone is noted at the lower pole of the left kidney. There is no evidence of hydronephrosis. No obstructing ureteral stones are seen. Stomach/Bowel: There is diffuse pneumatosis involving the cecum and ascending colon, with distention of the ascending colon to 7.4 cm in diameter. Diffuse surrounding soft tissue inflammation and fluid are seen. Bowel ischemia is a concern. There is wall thickening along the hepatic flexure of the colon, which may reflect an infectious or inflammatory process. No definite mass was seen in April. The remainder of the colon is unremarkable. The small bowel is grossly unremarkable in appearance. The stomach is unremarkable in appearance. Vascular/Lymphatic: Diffuse calcification is seen along the abdominal aorta and its branches. There is likely severe luminal narrowing at the aortic bifurcation. The inferior vena cava is grossly unremarkable. No retroperitoneal lymphadenopathy is seen. No pelvic sidewall lymphadenopathy is identified. Reproductive: The bladder is decompressed, with a Foley catheter in place. The patient is status post hysterectomy. Trace free fluid within the pelvis may reflect the colonic process. Other: No additional soft tissue abnormalities are seen. Musculoskeletal: No acute osseous abnormalities are identified. The visualized musculature is unremarkable in appearance. IMPRESSION: 1. Diffuse pneumatosis involving the cecum and ascending colon, with distention of the ascending colon to 7.4 cm in diameter. Diffuse surrounding soft tissue inflammation and fluid seen. This is concerning for bowel ischemia. Wall thickening along the hepatic flexure of the colon may reflect an underlying infectious or inflammatory process. No definite mass was seen in April. 2. Mild hazy bibasilar airspace opacities may reflect pneumonia. 3. Diffuse calcification along the abdominal aorta and its branches, with likely severe luminal narrowing at  the aortic bifurcation. 4. Scattered coronary calcifications. 5. Nonobstructing 6 mm stone at the lower pole of the left kidney. These results were called by telephone at the time of interpretation on 10/14/2017 at 4:23 am to Dr. Jola Schmidt, who verbally acknowledged these results. Electronically Signed   By: Garald Balding M.D.   On: 10/14/2017 04:24   Ct Chest Wo Contrast  Result Date: 10/14/2017 CLINICAL DATA:  Code sepsis. Tachycardia and decreased respiratory rate. Mid abdominal pain, acute onset. EXAM: CT CHEST, ABDOMEN AND PELVIS WITHOUT CONTRAST TECHNIQUE: Multidetector CT imaging of the chest, abdomen and pelvis was performed following the standard protocol without IV contrast. COMPARISON:  CT of the abdomen and pelvis performed 01/17/2017, and CT of the chest performed 08/18/2017 FINDINGS: CT CHEST FINDINGS Cardiovascular: The heart is normal in size. Scattered coronary artery calcifications are seen. Calcification is noted at the thoracic aorta and proximal great vessels. Mediastinum/Nodes: The mediastinum is otherwise unremarkable in appearance. No mediastinal lymphadenopathy is seen. No pericardial effusion is identified. The visualized portions of the thyroid gland  are unremarkable. No axillary lymphadenopathy is seen. Lungs/Pleura: Mild peripheral scarring is noted bilaterally. Mild hazy bibasilar airspace opacities may reflect pneumonia. No definite pleural effusion or pneumothorax is seen. No dominant mass is identified. Musculoskeletal: No acute osseous abnormalities are identified. The visualized musculature is unremarkable in appearance. Bilateral breast implants are noted. CT ABDOMEN PELVIS FINDINGS Hepatobiliary: The liver is unremarkable in appearance. The gallbladder is mildly distended. The common bile duct remains normal in caliber. Pancreas: The pancreas is within normal limits. Spleen: The spleen is unremarkable in appearance. Adrenals/Urinary Tract: The adrenal glands are unremarkable  in appearance. Nonspecific perinephric stranding is noted bilaterally. A nonobstructing 6 mm stone is noted at the lower pole of the left kidney. There is no evidence of hydronephrosis. No obstructing ureteral stones are seen. Stomach/Bowel: There is diffuse pneumatosis involving the cecum and ascending colon, with distention of the ascending colon to 7.4 cm in diameter. Diffuse surrounding soft tissue inflammation and fluid are seen. Bowel ischemia is a concern. There is wall thickening along the hepatic flexure of the colon, which may reflect an infectious or inflammatory process. No definite mass was seen in April. The remainder of the colon is unremarkable. The small bowel is grossly unremarkable in appearance. The stomach is unremarkable in appearance. Vascular/Lymphatic: Diffuse calcification is seen along the abdominal aorta and its branches. There is likely severe luminal narrowing at the aortic bifurcation. The inferior vena cava is grossly unremarkable. No retroperitoneal lymphadenopathy is seen. No pelvic sidewall lymphadenopathy is identified. Reproductive: The bladder is decompressed, with a Foley catheter in place. The patient is status post hysterectomy. Trace free fluid within the pelvis may reflect the colonic process. Other: No additional soft tissue abnormalities are seen. Musculoskeletal: No acute osseous abnormalities are identified. The visualized musculature is unremarkable in appearance. IMPRESSION: 1. Diffuse pneumatosis involving the cecum and ascending colon, with distention of the ascending colon to 7.4 cm in diameter. Diffuse surrounding soft tissue inflammation and fluid seen. This is concerning for bowel ischemia. Wall thickening along the hepatic flexure of the colon may reflect an underlying infectious or inflammatory process. No definite mass was seen in April. 2. Mild hazy bibasilar airspace opacities may reflect pneumonia. 3. Diffuse calcification along the abdominal aorta and its  branches, with likely severe luminal narrowing at the aortic bifurcation. 4. Scattered coronary calcifications. 5. Nonobstructing 6 mm stone at the lower pole of the left kidney. These results were called by telephone at the time of interpretation on 10/14/2017 at 4:23 am to Dr. Jola Schmidt, who verbally acknowledged these results. Electronically Signed   By: Garald Balding M.D.   On: 10/14/2017 04:24   Dg Chest Portable 1 View  Result Date: 10/14/2017 CLINICAL DATA:  Acute onset of shortness of breath.  Sepsis. EXAM: PORTABLE CHEST 1 VIEW COMPARISON:  CT of the chest performed 08/18/2017 FINDINGS: The lungs are hypoexpanded but appear grossly clear. Known tiny pulmonary nodules are not well characterized on radiograph. There is no evidence of focal opacification, pleural effusion or pneumothorax. The cardiomediastinal silhouette is within normal limits. No acute osseous abnormalities are seen. IMPRESSION: Lungs hypoexpanded but grossly clear. Electronically Signed   By: Garald Balding M.D.   On: 10/14/2017 03:34    Procedures .Critical Care Performed by: Jola Schmidt, MD Authorized by: Jola Schmidt, MD     Total critical care time: 40 minutes Critical care time was exclusive of separately billable procedures and treating other patients. Critical care was necessary to treat or prevent imminent or life-threatening  deterioration. Critical care was time spent personally by me on the following activities: development of treatment plan with patient and/or surrogate as well as nursing, discussions with consultants, evaluation of patient's response to treatment, examination of patient, obtaining history from patient or surrogate, ordering and performing treatments and interventions, ordering and review of laboratory studies, ordering and review of radiographic studies, pulse oximetry and re-evaluation of patient's condition.   Medications Ordered in ED Medications  piperacillin-tazobactam (ZOSYN) IVPB  3.375 g (0 g Intravenous Stopped 10/14/17 0440)  vancomycin (VANCOCIN) IVPB 1000 mg/200 mL premix (0 mg Intravenous Stopped 10/14/17 0440)  sodium chloride 0.9 % bolus 30 mL/kg (1,000 mLs Intravenous New Bag/Given 10/14/17 0320)  morphine 4 MG/ML injection 4 mg (4 mg Intravenous Given 10/14/17 0423)     Initial Impression / Assessment and Plan / ED Course  I have reviewed the triage vital signs and the nursing notes.  Pertinent labs & imaging results that were available during my care of the patient were reviewed by me and considered in my medical decision making (see chart for details).     3:17 AM Concern for abdominal sepsis and surgical abdomen. Notified radiology of need for stat CT imaging now. IV contrast allergy. IV abx now. Fluid resus now.   Spoke with Dr Marlou Starks, general surgery, who will evaluate the patient at bedside.  I did a prolonged discussion with multiple family members and expressed my significant concern for the patient's overall well-being.  She is at high risk of dying from this.  They understand this.  We will continue to be aggressive at this time.  Pain control.  IV antibiotics given early.  IV fluid resuscitation began early.  Final Clinical Impressions(s) / ED Diagnoses   Final diagnoses:  Acute abdominal pain  Ischemia, bowel [facility])    ED Discharge Orders    None       Jola Schmidt, MD 10/14/17 [phone]

## 2017-10-14 NOTE — Progress Notes (Signed)
Patient brought to room 1239 by OR staff.   Heart rhythm showed ventricular bigeminy in the 110's-120's, with increased BP. Conrad Van Buren, MD, at bedside, gave verbal order for Propofol for sedation.  Order executed.

## 2017-10-14 NOTE — Assessment & Plan Note (Signed)
Start fent gtt Do versed and fent prn RASS goal 0 to -2 and vent synch as goal

## 2017-10-14 NOTE — ED Notes (Signed)
When pt left 2L total had been infused and last 0.5L was hanging.

## 2017-10-14 NOTE — Op Note (Signed)
10/14/2017  7:07 AM  PATIENT:  [name]  72 y.o. female  PRE-OPERATIVE DIAGNOSIS:  Dead bowel  POST-OPERATIVE DIAGNOSIS:  Dead bowel  PROCEDURE:  Procedure(s): EXPLORATORY LAPAROTOMY, EXTENDED RIGHT COLECTOMY; APPLICATION OF ABDOMINAL VACUUM DRESSING (N/A)  SURGEON:  Surgeon(s) and Role:    * Jovita Kussmaul, MD - Primary  PHYSICIAN ASSISTANT:   ASSISTANTS: none   ANESTHESIA:   general  EBL:  100cc   BLOOD ADMINISTERED:none  DRAINS: none   LOCAL MEDICATIONS USED:  NONE  SPECIMEN:  Source of Specimen:  right colon  DISPOSITION OF SPECIMEN:  PATHOLOGY  COUNTS:  YES  TOURNIQUET:  * No tourniquets in log *  DICTATION: .Dragon Dictation   After informed consent was obtained the patient was brought to the operating room and placed in the supine position on the operating table.  After adequate induction of general anesthesia the patient's abdomen was prepped with ChloraPrep, allowed to dry, and draped in usual sterile manner.  An appropriate timeout was performed.  A midline incision was made with a 10 blade knife.  The incision was carried through the skin and subcutaneous tissue sharply with the electrocautery until the linea alba was identified.  The linea alba was incised with the electrocautery and the preperitoneal space was probed bluntly with a hemostat until the peritoneum was opened and access was gained to the abdominal cavity.  The rest of the incision was opened under direct vision.  The omentum was adherent to the anterior abdominal wall and this was taken down sharply with the electrocautery.  Once this was accomplished we could identify the right colon and appeared dead.  It was that all the way to the mid transverse colon.  The right colon was mobilized by incising its retroperitoneal attachment along the white line of [place].  Once the right colon was able to be reflected up and medially we then chose a spot along the distal small bowel where the bowel appeared to  be viable.  The mesentery at this point was opened sharply with electrocautery.  A GIA-75 stapler was placed across the small bowel at this point, clamped, and fired thereby dividing the bowel between staple lines.  The mesentery to the terminal ileum and right colon was taken down sharply with the LigaSure.  The omentum was separated from the transverse colon and we could identify a point of viability of the mid transverse colon.  The mesentery at this point was opened sharply with the electrocautery.  A GIA-75 stapler was placed across the colon at this point, clamped, and fired thereby dividing the colon between staple lines.  Once this was accomplished I was able to remove the terminal ileum right colon and proximal transverse colon.  This was sent to pathology for further evaluation.  The stomach, duodenum, and small bowel all appeared to be viable.  The left colon and rectum appeared to be viable.  The abdomen was irrigated with copious amounts of saline.  The distal small bowel although viable did appear globally ischemic.  I suspect this is from her sepsis.  At this point I decided to leave her in discontinuity and we applied a abdominal vacuum dressing.  She was taken intubated and critically ill to the ICU for further resuscitation measures.  At the end of the case all needle sponge and instrument counts were correct.  PLAN OF CARE: Admit to inpatient   PATIENT DISPOSITION:  ICU - intubated and critically ill.   Delay start of Pharmacological  VTE agent (>24hrs) due to surgical blood loss or risk of bleeding: no

## 2017-10-14 NOTE — Consult Note (Signed)
PULMONARY / CRITICAL CARE MEDICINE   Name: [name] MRN: [id number] DOB: [birth year] PCP Leanna Battles, MD LOS 0 as of 10/14/2017     ADMISSION DATE:  10/14/2017 CONSULTATION DATE:  10/14/2017   REFERRING MD:  Surgery  CHIEF COMPLAINT:  Post op respiratory failure  HISTORY OF PRESENT ILLNESS:   72 year old female with multiple medical problems.  Presented acutely with abdominal pain found to have pneumatosis of the right colon on CT scan.  Status post emergent laparotomy on the morning of October 14, 2017 with extended right colectomy and application of abdominal vacuum dressing by Dr. Autumn Messing and estimated blood loss of 100 cc postoperatively.  Patient was sent to the intensive care unit in the intubated state and critical care medicine was consulted for postoperative acute respiratory failure and medical management, RN reports pain, hyp0ertension, vent dysnchrony in ICU. ET tube is at carina  PAST MEDICAL HISTORY :  She  has a past medical history of Anxiety, Arthropathy, unspecified, site unspecified, Blind loop syndrome, Carotid artery disease ([place]), Coronary artery disease, Depression, Diabetes insipidus ([place]), Diabetes mellitus, Diverticulosis, Dizziness, Dyslipidemia, Dyspnea, Fatty liver, GERD (gastroesophageal reflux disease), Hepatomegaly, Hiatal hernia, HTN (hypertension), Hyperlipidemia, Hypertension, Irritable bowel syndrome, Melanosis coli, Metatarsal fracture (right), Normal nuclear stress test, Other, mixed, or unspecified nondependent drug abuse, unspecified, Pancreatitis, chronic ([place]), Peripheral vascular disease ([place]) (02/20/10), Sleep apnea, Stroke [facility]), Thyroid nodule, Unspecified essential hypertension, and Vitamin B12 deficiency.  PAST SURGICAL HISTORY: She  has a past surgical history that includes Breast enhancement surgery; Tonsillectomy; Wrist surgery; Abdominal hysterectomy; Cardiac catheterization (07/07/98;08/31/01;03/04/05); Femoral artery stent; Patella  realignment (Left); Total abdominal hysterectomy; Cataract extraction; Eye surgery (Left, Nov. 2016); Eye surgery (Right, 2001); Endobronchial ultrasound (Bilateral, 02/24/2017); Augmentation mammaplasty; and Lung surgery (03/2017).  Allergies  Allergen Reactions  . Codeine     Altered mental state   . Iodinated Diagnostic Agents Rash    No current facility-administered medications on file prior to encounter.    Current Outpatient Medications on File Prior to Encounter  Medication Sig  . ABILIFY 5 MG tablet Take 5 mg by mouth daily.   Marland Kitchen amLODipine (NORVASC) 10 MG tablet Take 10 mg by mouth daily.    . ASPERCREME HEAT 10 % GEL Apply 1 application topically as needed (arthritis).   Marland Kitchen aspirin EC 81 MG tablet Take 81 mg by mouth every evening.  Marland Kitchen atorvastatin (LIPITOR) 20 MG tablet Take 20 mg by mouth daily.    . Cyanocobalamin (VITAMIN B-12 PO) Take 1 tablet by mouth once a week.   . dicyclomine (BENTYL) 20 MG tablet Take 10 mg by mouth 4 (four) times daily -  before meals and at bedtime.  . diphenhydrAMINE (BENADRYL) 50 MG tablet Take 1 tablet (50 mg total) by mouth daily as needed for allergies.  Marland Kitchen esomeprazole (NEXIUM) 40 MG capsule Take 40 mg by mouth 2 (two) times daily.    . furosemide (LASIX) 20 MG tablet Take 20 mg by mouth daily.  Marland Kitchen gabapentin (NEURONTIN) 300 MG capsule Take 300 mg by mouth 2 (two) times daily.   Marland Kitchen HYDROcodone-acetaminophen (NORCO/VICODIN) 5-325 MG tablet Take 1 tablet by mouth 4 (four) times daily as needed for severe pain.   Marland Kitchen ibuprofen (ADVIL,MOTRIN) 200 MG tablet Take 200-400 mg by mouth daily as needed for headache or moderate pain.  . isosorbide mononitrate (IMDUR) 60 MG 24 hr tablet TAKE 1 TABLET BY MOUTH EVERY DAY  . loperamide (IMODIUM) 1 MG/5ML solution Take 2 mg by mouth daily  as needed for diarrhea or loose stools.  Marland Kitchen losartan (COZAAR) 100 MG tablet Take 100 mg by mouth daily.  . methscopolamine (PAMINE FORTE) 5 MG tablet Take 5 mg by mouth at bedtime.     . metoprolol succinate (TOPROL-XL) 100 MG 24 hr tablet Take 100 mg by mouth daily.  . montelukast (SINGULAIR) 10 MG tablet Take 10 mg by mouth daily.    Marland Kitchen MYRBETRIQ 50 MG TB24 tablet Take 50 mg by mouth daily.  . nitroGLYCERIN (NITROSTAT) 0.4 MG SL tablet Place 1 tablet (0.4 mg total) under the tongue every 5 (five) minutes as needed for chest pain.  . Pancrelipase, Lip-Prot-Amyl, (ZENPEP) 25000-79000 units CPEP Take 2 capsules by mouth 3 (three) times daily before meals. And with snacks  . rOPINIRole (REQUIP) 0.5 MG tablet Take 0.5 mg by mouth at bedtime as needed (restless legs).   . tamsulosin (FLOMAX) 0.4 MG CAPS capsule Take 0.4 mg by mouth daily.  Marland Kitchen venlafaxine (EFFEXOR-XR) 150 MG 24 hr capsule Take 150 mg by mouth daily.    Marland Kitchen VICTOZA 18 MG/3ML SOPN Inject 1.8 mg into the skin every evening.   . zolpidem (AMBIEN) 10 MG tablet Take 10 mg by mouth at bedtime as needed for sleep.    FAMILY HISTORY:  Her indicated that her mother is deceased. She indicated that her father is deceased. She indicated that her maternal grandmother is deceased. She indicated that her maternal grandfather is deceased. She indicated that her paternal grandmother is deceased. She indicated that her paternal grandfather is deceased. She indicated that the status of her unknown relative is unknown.   SOCIAL HISTORY: She  reports that she has been smoking cigarettes.  She has a 50.00 pack-year smoking history. she has never used smokeless tobacco. She reports that she does not drink alcohol or use drugs.    VITAL SIGNS: BP 135/69   Pulse 67   Temp (!) 100.8 F (38.2 C)   Resp (!) 32   Ht 5\' 4"  (1.626 m)   Wt 79.1 kg (174 lb 6.1 oz)   SpO2 99%   BMI 29.93 kg/m    HEMODYNAMICS:    VENTILATOR SETTINGS: Vent Mode: PRVC FiO2 (%):  [100 %] 100 % Set Rate:  [18 bmp] 18 bmp Vt Set:  [440 mL] 440 mL PEEP:  [5 cmH20] 5 cmH20 Plateau Pressure:  [16 cmH20] 16 cmH20  INTAKE / OUTPUT: I/O last 3 completed  shifts: In: 1000 [I.V.:1000] Out: 120 [Urine:120]     EXAM  General Appearance:    Looks criticall ill OBESE - yes  Head:    Normocephalic, without obvious abnormality, atraumatic  Eyes:    PERRL - yes, conjunctiva/corneas - clear      Ears:    Normal external ear canals, both ears  Nose:   NG tube - no  Throat:  ETT TUBE - yes , OG tube - yes  Neck:   Supple,  No enlargement/tenderness/nodules     Lungs:     Clear to auscultation bilaterally, Ventilator   Synchrony - NO. Patient is dysnchronous  Chest wall:    No deformity  Heart:    S1 and S2 normal, no murmur, CVP - no.  Pressors - no  Abdomen:     Soft, no masses, no organomegaly  Genitalia:    Not done  Rectal:   not done  Extremities:   Extremities- intact     Skin:   Intact in exposed areas . Sacral area - no  decub reported     Neurologic:   Sedation - prn sedation -> RASS - -2 . Moves all 4s - yes. CAM-ICU - na . Orientation - na . PAIN +        LABS  PULMONARY Recent Labs  Lab 10/14/17 0310 10/14/17 0330 10/14/17 0641  PHART  --  7.527* 7.389  PCO2ART  --  23.8* 36.9  PO2ART  --  205* 239.0*  HCO3  --  19.6* 22.3  TCO2 20*  --  23  O2SAT  --  98.7 100.0    CBC Recent Labs  Lab 10/14/17 0310 10/14/17 0329 10/14/17 0641  HGB 17.7* 16.8* 10.5*  HCT 52.0* 47.9* 31.0*  WBC  --  20.0*  --   PLT  --  210  --     COAGULATION Recent Labs  Lab 10/14/17 0329  INR 1.38    CARDIAC   Recent Labs  Lab 10/14/17 0329  TROPONINI 0.07*   No results for input(s): PROBNP in the last 168 hours.   CHEMISTRY Recent Labs  Lab 10/14/17 0310 10/14/17 0329 10/14/17 0641  NA 136 135 137  K 3.4* 3.3* 4.6  CL 98* 98*  --   CO2  --  21*  --   GLUCOSE 189* 186*  --   BUN 37* 39*  --   CREATININE 1.20* 1.37*  --   CALCIUM  --  9.1  --    Estimated Creatinine Clearance: 38.9 mL/min (A) (by C-G formula based on SCr of 1.37 mg/dL (H)).   LIVER Recent Labs  Lab 10/14/17 0329  AST 41  ALT 23   ALKPHOS 125  BILITOT 1.1  PROT 6.9  ALBUMIN 2.8*  INR 1.38     INFECTIOUS Recent Labs  Lab 10/14/17 0310 10/14/17 0508  LATICACIDVEN 6.14* 4.38*     ENDOCRINE CBG (last 3)  No results for input(s): GLUCAP in the last 72 hours.       IMAGING x48h  - image(s) personally visualized  -   highlighted in bold Ct Abdomen Pelvis Wo Contrast  Result Date: 10/14/2017 CLINICAL DATA:  Code sepsis. Tachycardia and decreased respiratory rate. Mid abdominal pain, acute onset. EXAM: CT CHEST, ABDOMEN AND PELVIS WITHOUT CONTRAST TECHNIQUE: Multidetector CT imaging of the chest, abdomen and pelvis was performed following the standard protocol without IV contrast. COMPARISON:  CT of the abdomen and pelvis performed 01/17/2017, and CT of the chest performed 08/18/2017 FINDINGS: CT CHEST FINDINGS Cardiovascular: The heart is normal in size. Scattered coronary artery calcifications are seen. Calcification is noted at the thoracic aorta and proximal great vessels. Mediastinum/Nodes: The mediastinum is otherwise unremarkable in appearance. No mediastinal lymphadenopathy is seen. No pericardial effusion is identified. The visualized portions of the thyroid gland are unremarkable. No axillary lymphadenopathy is seen. Lungs/Pleura: Mild peripheral scarring is noted bilaterally. Mild hazy bibasilar airspace opacities may reflect pneumonia. No definite pleural effusion or pneumothorax is seen. No dominant mass is identified. Musculoskeletal: No acute osseous abnormalities are identified. The visualized musculature is unremarkable in appearance. Bilateral breast implants are noted. CT ABDOMEN PELVIS FINDINGS Hepatobiliary: The liver is unremarkable in appearance. The gallbladder is mildly distended. The common bile duct remains normal in caliber. Pancreas: The pancreas is within normal limits. Spleen: The spleen is unremarkable in appearance. Adrenals/Urinary Tract: The adrenal glands are unremarkable in appearance.  Nonspecific perinephric stranding is noted bilaterally. A nonobstructing 6 mm stone is noted at the lower pole of the left kidney. There is no evidence of  hydronephrosis. No obstructing ureteral stones are seen. Stomach/Bowel: There is diffuse pneumatosis involving the cecum and ascending colon, with distention of the ascending colon to 7.4 cm in diameter. Diffuse surrounding soft tissue inflammation and fluid are seen. Bowel ischemia is a concern. There is wall thickening along the hepatic flexure of the colon, which may reflect an infectious or inflammatory process. No definite mass was seen in April. The remainder of the colon is unremarkable. The small bowel is grossly unremarkable in appearance. The stomach is unremarkable in appearance. Vascular/Lymphatic: Diffuse calcification is seen along the abdominal aorta and its branches. There is likely severe luminal narrowing at the aortic bifurcation. The inferior vena cava is grossly unremarkable. No retroperitoneal lymphadenopathy is seen. No pelvic sidewall lymphadenopathy is identified. Reproductive: The bladder is decompressed, with a Foley catheter in place. The patient is status post hysterectomy. Trace free fluid within the pelvis may reflect the colonic process. Other: No additional soft tissue abnormalities are seen. Musculoskeletal: No acute osseous abnormalities are identified. The visualized musculature is unremarkable in appearance. IMPRESSION: 1. Diffuse pneumatosis involving the cecum and ascending colon, with distention of the ascending colon to 7.4 cm in diameter. Diffuse surrounding soft tissue inflammation and fluid seen. This is concerning for bowel ischemia. Wall thickening along the hepatic flexure of the colon may reflect an underlying infectious or inflammatory process. No definite mass was seen in April. 2. Mild hazy bibasilar airspace opacities may reflect pneumonia. 3. Diffuse calcification along the abdominal aorta and its branches, with  likely severe luminal narrowing at the aortic bifurcation. 4. Scattered coronary calcifications. 5. Nonobstructing 6 mm stone at the lower pole of the left kidney. These results were called by telephone at the time of interpretation on 10/14/2017 at 4:23 am to Dr. Jola Schmidt, who verbally acknowledged these results. Electronically Signed   By: Garald Balding M.D.   On: 10/14/2017 04:24   Dg Chest 1 View  Result Date: 10/14/2017 CLINICAL DATA:  72 year old female with a history of central line placement EXAM: CHEST 1 VIEW COMPARISON:  CT 10/14/2017, plain film 10/14/2017 FINDINGS: Cardiomediastinal silhouette unchanged. Interval placement of gastric tube projected over the mediastinum and terminates in the abdomen out of the field of view. Interval placement of endotracheal tube, directed towards the right mainstem bronchus, and may be pulled back approximately 3-4 cm for better positioning. Interval placement of right IJ central venous catheter appears to terminate superior cavoatrial junction. Low lung volumes with basilar opacities, likely atelectasis. No pneumothorax. IMPRESSION: Low lung volumes with likely atelectasis. Interval placement of right IJ central catheter with no complicating features. Endotracheal tube terminates at the carina directed towards the right mainstem bronchus. Withdrawal of approximately 3-4 cm recommended. Gastric tube has been placed, terminating out of the field of view in the upper abdomen. These results were called by telephone at the time of interpretation on 10/14/2017 at 9:40 am to the nurse, Ms Leanna Sato, who verbally acknowledged these results. Electronically Signed   By: Corrie Mckusick D.O.   On: 10/14/2017 09:42   Ct Chest Wo Contrast  Result Date: 10/14/2017 CLINICAL DATA:  Code sepsis. Tachycardia and decreased respiratory rate. Mid abdominal pain, acute onset. EXAM: CT CHEST, ABDOMEN AND PELVIS WITHOUT CONTRAST TECHNIQUE: Multidetector CT imaging of the chest, abdomen  and pelvis was performed following the standard protocol without IV contrast. COMPARISON:  CT of the abdomen and pelvis performed 01/17/2017, and CT of the chest performed 08/18/2017 FINDINGS: CT CHEST FINDINGS Cardiovascular: The heart is normal in  size. Scattered coronary artery calcifications are seen. Calcification is noted at the thoracic aorta and proximal great vessels. Mediastinum/Nodes: The mediastinum is otherwise unremarkable in appearance. No mediastinal lymphadenopathy is seen. No pericardial effusion is identified. The visualized portions of the thyroid gland are unremarkable. No axillary lymphadenopathy is seen. Lungs/Pleura: Mild peripheral scarring is noted bilaterally. Mild hazy bibasilar airspace opacities may reflect pneumonia. No definite pleural effusion or pneumothorax is seen. No dominant mass is identified. Musculoskeletal: No acute osseous abnormalities are identified. The visualized musculature is unremarkable in appearance. Bilateral breast implants are noted. CT ABDOMEN PELVIS FINDINGS Hepatobiliary: The liver is unremarkable in appearance. The gallbladder is mildly distended. The common bile duct remains normal in caliber. Pancreas: The pancreas is within normal limits. Spleen: The spleen is unremarkable in appearance. Adrenals/Urinary Tract: The adrenal glands are unremarkable in appearance. Nonspecific perinephric stranding is noted bilaterally. A nonobstructing 6 mm stone is noted at the lower pole of the left kidney. There is no evidence of hydronephrosis. No obstructing ureteral stones are seen. Stomach/Bowel: There is diffuse pneumatosis involving the cecum and ascending colon, with distention of the ascending colon to 7.4 cm in diameter. Diffuse surrounding soft tissue inflammation and fluid are seen. Bowel ischemia is a concern. There is wall thickening along the hepatic flexure of the colon, which may reflect an infectious or inflammatory process. No definite mass was seen in  April. The remainder of the colon is unremarkable. The small bowel is grossly unremarkable in appearance. The stomach is unremarkable in appearance. Vascular/Lymphatic: Diffuse calcification is seen along the abdominal aorta and its branches. There is likely severe luminal narrowing at the aortic bifurcation. The inferior vena cava is grossly unremarkable. No retroperitoneal lymphadenopathy is seen. No pelvic sidewall lymphadenopathy is identified. Reproductive: The bladder is decompressed, with a Foley catheter in place. The patient is status post hysterectomy. Trace free fluid within the pelvis may reflect the colonic process. Other: No additional soft tissue abnormalities are seen. Musculoskeletal: No acute osseous abnormalities are identified. The visualized musculature is unremarkable in appearance. IMPRESSION: 1. Diffuse pneumatosis involving the cecum and ascending colon, with distention of the ascending colon to 7.4 cm in diameter. Diffuse surrounding soft tissue inflammation and fluid seen. This is concerning for bowel ischemia. Wall thickening along the hepatic flexure of the colon may reflect an underlying infectious or inflammatory process. No definite mass was seen in April. 2. Mild hazy bibasilar airspace opacities may reflect pneumonia. 3. Diffuse calcification along the abdominal aorta and its branches, with likely severe luminal narrowing at the aortic bifurcation. 4. Scattered coronary calcifications. 5. Nonobstructing 6 mm stone at the lower pole of the left kidney. These results were called by telephone at the time of interpretation on 10/14/2017 at 4:23 am to Dr. Jola Schmidt, who verbally acknowledged these results. Electronically Signed   By: Garald Balding M.D.   On: 10/14/2017 04:24   Dg Chest Portable 1 View  Result Date: 10/14/2017 CLINICAL DATA:  Acute onset of shortness of breath.  Sepsis. EXAM: PORTABLE CHEST 1 VIEW COMPARISON:  CT of the chest performed 08/18/2017 FINDINGS: The lungs  are hypoexpanded but appear grossly clear. Known tiny pulmonary nodules are not well characterized on radiograph. There is no evidence of focal opacification, pleural effusion or pneumothorax. The cardiomediastinal silhouette is within normal limits. No acute osseous abnormalities are seen. IMPRESSION: Lungs hypoexpanded but grossly clear. Electronically Signed   By: Garald Balding M.D.   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 No obvisou blood loss  - PRBC for hgb </= 6.9gm%    - exceptions are   -  if ACS susepcted/confirmed then transfuse for hgb </= 8.0gm%,  or    -  active bleeding with hemodynamic instability, then transfuse regardless of hemoglobin value   At at all times try to transfuse 1 unit prbc as possible with exception of active hemorrhage        FAMILY  - Updates: 10/14/2017 --> no family at bedside  - Inter-disciplinary family meet or Palliative Care meeting due by:  DAy 7. Current LOS is LOS 0 days  CODE STATUS    Code Status Orders  (From admission, onward)        Start     Ordered   10/14/17 0742  Full code  Continuous     10/14/17 0741    Code Status History    Date Active Date Inactive Code Status Order ID Comments User Context   This patient has a current code status but no historical code status.        DISPO Keep in ICU      The patient is critically ill with multiple organ systems failure and requires high  complexity decision making for assessment and support, frequent evaluation and titration of therapies, application of advanced monitoring technologies and extensive interpretation of multiple databases.   Critical Care Time devoted to patient care services described in this note is  30  Minutes. This time reflects time of care of this signee Dr Brand Males. This critical care time does not reflect procedure time, or teaching time or supervisory time of PA/NP/Med student/Med Resident etc but could involve care discussion time   Dr. Brand Males, M.D., [facility].C.P Pulmonary and Critical Care Medicine Staff Physician, [place] Director - Interstitial Lung Disease  Program  [facility] [place], [place], [id number]  Pager: [phone], If no answer or between  15:00h - 7:00h: call [phone] Telephone: [phone]

## 2017-10-14 NOTE — Progress Notes (Signed)
Patient's HR maintaining in the 140's, rhythm going between ventricular bigeminy and quageminy, patient's temp 101.8.  Chase Caller, MD made aware.  Verbal orders placed to draw BMET, Phosphorus, Magnesium, and troponin, and to obtain a 12 lead EKG.  Orders executed, will continue to monitor.

## 2017-10-14 NOTE — ED Notes (Signed)
Both sets of blood cultures drawn prior to antibiotic administration.

## 2017-10-14 NOTE — Assessment & Plan Note (Signed)
Anemia post op. No obvisou blood loss  - PRBC for hgb </= 6.9gm%    - exceptions are   -  if ACS susepcted/confirmed then transfuse for hgb </= 8.0gm%,  or    -  active bleeding with hemodynamic instability, then transfuse regardless of hemoglobin value   At at all times try to transfuse 1 unit prbc as possible with exception of active hemorrhage

## 2017-10-14 NOTE — H&P (Signed)
[name] is an 72 y.o. female.   Chief Complaint: abd pain HPI: The patient is a 72 yo wf who presents with abdominal pain that started yesterday. Pt and family are not sure how long she has been sick. abd pain worsened. Came to ER. She is hypotensive with elevated wbc and significant abd tenderness. CT shows pneumatosis of right colon  Past Medical History:  Diagnosis Date  . Anxiety   . Arthropathy, unspecified, site unspecified   . Blind loop syndrome   . Carotid artery disease ([place])    documented occlusion of left ICA  . Coronary artery disease    a. known 60-70% mid-LAD stenosis with caths in 1999, 2002, 2006, and 2012 showing stable anatomy b. low-risk NST in 10/2015  . Depression   . Diabetes insipidus ([place])   . Diabetes mellitus   . Diverticulosis   . Dizziness    chronic  . Dyslipidemia   . Dyspnea    with exertion  . Fatty liver   . GERD (gastroesophageal reflux disease)    hiatial hernia  . Hepatomegaly   . Hiatal hernia   . HTN (hypertension)   . Hyperlipidemia   . Hypertension   . Irritable bowel syndrome   . Melanosis coli   . Metatarsal fracture right  . Normal nuclear stress test    nml 01/03/10;06/19/07  . Other, mixed, or unspecified nondependent drug abuse, unspecified   . Pancreatitis, chronic ([place])   . Peripheral vascular disease ([place]) 02/20/10   s/p stent of left SFA  . Sleep apnea   . Stroke ([place])   . Thyroid nodule   . Unspecified essential hypertension   . Vitamin B12 deficiency     Past Surgical History:  Procedure Laterality Date  . ABDOMINAL HYSTERECTOMY     partial  . AUGMENTATION MAMMAPLASTY     bilateral 1976 retro   . BREAST ENHANCEMENT SURGERY    . CARDIAC CATHETERIZATION  07/07/98;08/31/01;03/04/05   60 -70% LAD  . CATARACT EXTRACTION    . ENDOBRONCHIAL ULTRASOUND Bilateral 02/24/2017   Procedure: ENDOBRONCHIAL ULTRASOUND;  Surgeon: Collene Gobble, MD;  Location: [facility];  Service: Cardiopulmonary;  Laterality: Bilateral;   . EYE SURGERY Left Nov. 2016   Cataract  . EYE SURGERY Right 2001   Cataract  . FEMORAL ARTERY STENT     Left leg  . LUNG SURGERY  03/2017   Benign polyps removed  . PATELLA REALIGNMENT Left   . TONSILLECTOMY    . TOTAL ABDOMINAL HYSTERECTOMY    . WRIST SURGERY     right    Family History  Problem Relation Age of Onset  . Hypertension Mother   . Osteoarthritis Mother   . Pulmonary embolism Mother   . Hypertension Father        aneurysm  . Stroke Father   . Stroke Unknown   . Colon cancer Paternal Grandfather    Social History:  reports that she has been smoking cigarettes.  She has a 50.00 pack-year smoking history. she has never used smokeless tobacco. She reports that she does not drink alcohol or use drugs.  Allergies:  Allergies  Allergen Reactions  . Codeine     Altered mental state   . Iodinated Diagnostic Agents Rash     (Not in a hospital admission)  Results for orders placed or performed during the hospital encounter of 10/14/17 (from the past 48 hour(s))  I-Stat CG4 Lactic Acid, ED     Status: Abnormal  Collection Time: 10/14/17  3:10 AM  Result Value Ref Range   Lactic Acid, Venous 6.14 (HH) 0.5 - 1.9 mmol/L   Comment NOTIFIED PHYSICIAN   I-Stat Chem 8, ED     Status: Abnormal   Collection Time: 10/14/17  3:10 AM  Result Value Ref Range   Sodium 136 135 - 145 mmol/L   Potassium 3.4 (L) 3.5 - 5.1 mmol/L   Chloride 98 (L) 101 - 111 mmol/L   BUN 37 (H) 6 - 20 mg/dL   Creatinine, Ser 1.20 (H) 0.44 - 1.00 mg/dL   Glucose, Bld 189 (H) 65 - 99 mg/dL   Calcium, Ion 1.01 (L) 1.15 - 1.40 mmol/L   TCO2 20 (L) 22 - 32 mmol/L   Hemoglobin 17.7 (H) 12.0 - 15.0 g/dL   HCT 52.0 (H) 36.0 - 46.0 %  Comprehensive metabolic panel     Status: Abnormal   Collection Time: 10/14/17  3:29 AM  Result Value Ref Range   Sodium 135 135 - 145 mmol/L   Potassium 3.3 (L) 3.5 - 5.1 mmol/L   Chloride 98 (L) 101 - 111 mmol/L   CO2 21 (L) 22 - 32 mmol/L   Glucose, Bld 186  (H) 65 - 99 mg/dL   BUN 39 (H) 6 - 20 mg/dL   Creatinine, Ser 1.37 (H) 0.44 - 1.00 mg/dL   Calcium 9.1 8.9 - 10.3 mg/dL   Total Protein 6.9 6.5 - 8.1 g/dL   Albumin 2.8 (L) 3.5 - 5.0 g/dL   AST 41 15 - 41 U/L   ALT 23 14 - 54 U/L   Alkaline Phosphatase 125 38 - 126 U/L   Total Bilirubin 1.1 0.3 - 1.2 mg/dL   GFR calc non Af Amer 38 (L) >60 mL/min   GFR calc Af Amer 44 (L) >60 mL/min    Comment: (NOTE) The eGFR has been calculated using the CKD EPI equation. This calculation has not been validated in all clinical situations. eGFR's persistently <60 mL/min signify possible Chronic Kidney Disease.    Anion gap 16 (H) 5 - 15  CBC with Differential     Status: Abnormal   Collection Time: 10/14/17  3:29 AM  Result Value Ref Range   WBC 20.0 (H) 4.0 - 10.5 K/uL   RBC 5.65 (H) 3.87 - 5.11 MIL/uL   Hemoglobin 16.8 (H) 12.0 - 15.0 g/dL   HCT 47.9 (H) 36.0 - 46.0 %   MCV 84.8 78.0 - 100.0 fL   MCH 29.7 26.0 - 34.0 pg   MCHC 35.1 30.0 - 36.0 g/dL   RDW 15.1 11.5 - 15.5 %   Platelets 210 150 - 400 K/uL   Neutrophils Relative % 87 %   Neutro Abs 17.3 (H) 1.7 - 7.7 K/uL   Lymphocytes Relative 9 %   Lymphs Abs 1.8 0.7 - 4.0 K/uL   Monocytes Relative 4 %   Monocytes Absolute 0.8 0.1 - 1.0 K/uL   Eosinophils Relative 0 %   Eosinophils Absolute 0.0 0.0 - 0.7 K/uL   Basophils Relative 0 %   Basophils Absolute 0.0 0.0 - 0.1 K/uL  Protime-INR     Status: Abnormal   Collection Time: 10/14/17  3:29 AM  Result Value Ref Range   Prothrombin Time 16.8 (H) 11.4 - 15.2 seconds   INR 1.38   Lipase, blood     Status: None   Collection Time: 10/14/17  3:29 AM  Result Value Ref Range   Lipase 17 11 - 51 U/L  Troponin I     Status: Abnormal   Collection Time: 10/14/17  3:29 AM  Result Value Ref Range   Troponin I 0.07 (HH) <0.03 ng/mL    Comment: CRITICAL RESULT CALLED TO, READ BACK BY AND VERIFIED WITH: Fontaine No RN [phone] 10/14/17 A NAVARRO   Blood gas, arterial ([facility] ONLY)     Status:  Abnormal   Collection Time: 10/14/17  3:30 AM  Result Value Ref Range   O2 Content 2.0 L/min   Delivery systems NASAL CANNULA    pH, Arterial 7.527 (H) 7.350 - 7.450   pCO2 arterial 23.8 (L) 32.0 - 48.0 mmHg   pO2, Arterial 205 (H) 83.0 - 108.0 mmHg   Bicarbonate 19.6 (L) 20.0 - 28.0 mmol/L   Acid-base deficit 0.8 0.0 - 2.0 mmol/L   O2 Saturation 98.7 %   Patient temperature 37.0    Collection site LEFT RADIAL    Drawn by [id number]    Sample type ARTERIAL DRAW    Allens test (pass/fail) PASS PASS  Urinalysis, Routine w reflex microscopic     Status: Abnormal   Collection Time: 10/14/17  3:32 AM  Result Value Ref Range   Color, Urine AMBER (A) YELLOW    Comment: BIOCHEMICALS MAY BE AFFECTED BY COLOR   APPearance CLEAR CLEAR   Specific Gravity, Urine 1.016 1.005 - 1.030   pH 6.0 5.0 - 8.0   Glucose, UA 50 (A) NEGATIVE mg/dL   Hgb urine dipstick SMALL (A) NEGATIVE   Bilirubin Urine NEGATIVE NEGATIVE   Ketones, ur NEGATIVE NEGATIVE mg/dL   Protein, ur 100 (A) NEGATIVE mg/dL   Nitrite NEGATIVE NEGATIVE   Leukocytes, UA NEGATIVE NEGATIVE   RBC / HPF 0-5 0 - 5 RBC/hpf   WBC, UA 0-5 0 - 5 WBC/hpf   Bacteria, UA RARE (A) NONE SEEN   Squamous Epithelial / LPF 0-5 (A) NONE SEEN   Mucus PRESENT    Hyaline Casts, UA PRESENT    Amorphous Crystal PRESENT   I-Stat CG4 Lactic Acid, ED     Status: Abnormal   Collection Time: 10/14/17  5:08 AM  Result Value Ref Range   Lactic Acid, Venous 4.38 (HH) 0.5 - 1.9 mmol/L   Comment NOTIFIED PHYSICIAN    Ct Abdomen Pelvis Wo Contrast  Result Date: 10/14/2017 CLINICAL DATA:  Code sepsis. Tachycardia and decreased respiratory rate. Mid abdominal pain, acute onset. EXAM: CT CHEST, ABDOMEN AND PELVIS WITHOUT CONTRAST TECHNIQUE: Multidetector CT imaging of the chest, abdomen and pelvis was performed following the standard protocol without IV contrast. COMPARISON:  CT of the abdomen and pelvis performed 01/17/2017, and CT of the chest performed 08/18/2017  FINDINGS: CT CHEST FINDINGS Cardiovascular: The heart is normal in size. Scattered coronary artery calcifications are seen. Calcification is noted at the thoracic aorta and proximal great vessels. Mediastinum/Nodes: The mediastinum is otherwise unremarkable in appearance. No mediastinal lymphadenopathy is seen. No pericardial effusion is identified. The visualized portions of the thyroid gland are unremarkable. No axillary lymphadenopathy is seen. Lungs/Pleura: Mild peripheral scarring is noted bilaterally. Mild hazy bibasilar airspace opacities may reflect pneumonia. No definite pleural effusion or pneumothorax is seen. No dominant mass is identified. Musculoskeletal: No acute osseous abnormalities are identified. The visualized musculature is unremarkable in appearance. Bilateral breast implants are noted. CT ABDOMEN PELVIS FINDINGS Hepatobiliary: The liver is unremarkable in appearance. The gallbladder is mildly distended. The common bile duct remains normal in caliber. Pancreas: The pancreas is within normal limits. Spleen: The spleen is unremarkable  in appearance. Adrenals/Urinary Tract: The adrenal glands are unremarkable in appearance. Nonspecific perinephric stranding is noted bilaterally. A nonobstructing 6 mm stone is noted at the lower pole of the left kidney. There is no evidence of hydronephrosis. No obstructing ureteral stones are seen. Stomach/Bowel: There is diffuse pneumatosis involving the cecum and ascending colon, with distention of the ascending colon to 7.4 cm in diameter. Diffuse surrounding soft tissue inflammation and fluid are seen. Bowel ischemia is a concern. There is wall thickening along the hepatic flexure of the colon, which may reflect an infectious or inflammatory process. No definite mass was seen in April. The remainder of the colon is unremarkable. The small bowel is grossly unremarkable in appearance. The stomach is unremarkable in appearance. Vascular/Lymphatic: Diffuse  calcification is seen along the abdominal aorta and its branches. There is likely severe luminal narrowing at the aortic bifurcation. The inferior vena cava is grossly unremarkable. No retroperitoneal lymphadenopathy is seen. No pelvic sidewall lymphadenopathy is identified. Reproductive: The bladder is decompressed, with a Foley catheter in place. The patient is status post hysterectomy. Trace free fluid within the pelvis may reflect the colonic process. Other: No additional soft tissue abnormalities are seen. Musculoskeletal: No acute osseous abnormalities are identified. The visualized musculature is unremarkable in appearance. IMPRESSION: 1. Diffuse pneumatosis involving the cecum and ascending colon, with distention of the ascending colon to 7.4 cm in diameter. Diffuse surrounding soft tissue inflammation and fluid seen. This is concerning for bowel ischemia. Wall thickening along the hepatic flexure of the colon may reflect an underlying infectious or inflammatory process. No definite mass was seen in April. 2. Mild hazy bibasilar airspace opacities may reflect pneumonia. 3. Diffuse calcification along the abdominal aorta and its branches, with likely severe luminal narrowing at the aortic bifurcation. 4. Scattered coronary calcifications. 5. Nonobstructing 6 mm stone at the lower pole of the left kidney. These results were called by telephone at the time of interpretation on 10/14/2017 at 4:23 am to Dr. Jola Schmidt, who verbally acknowledged these results. Electronically Signed   By: Garald Balding M.D.   On: 10/14/2017 04:24   Ct Chest Wo Contrast  Result Date: 10/14/2017 CLINICAL DATA:  Code sepsis. Tachycardia and decreased respiratory rate. Mid abdominal pain, acute onset. EXAM: CT CHEST, ABDOMEN AND PELVIS WITHOUT CONTRAST TECHNIQUE: Multidetector CT imaging of the chest, abdomen and pelvis was performed following the standard protocol without IV contrast. COMPARISON:  CT of the abdomen and pelvis  performed 01/17/2017, and CT of the chest performed 08/18/2017 FINDINGS: CT CHEST FINDINGS Cardiovascular: The heart is normal in size. Scattered coronary artery calcifications are seen. Calcification is noted at the thoracic aorta and proximal great vessels. Mediastinum/Nodes: The mediastinum is otherwise unremarkable in appearance. No mediastinal lymphadenopathy is seen. No pericardial effusion is identified. The visualized portions of the thyroid gland are unremarkable. No axillary lymphadenopathy is seen. Lungs/Pleura: Mild peripheral scarring is noted bilaterally. Mild hazy bibasilar airspace opacities may reflect pneumonia. No definite pleural effusion or pneumothorax is seen. No dominant mass is identified. Musculoskeletal: No acute osseous abnormalities are identified. The visualized musculature is unremarkable in appearance. Bilateral breast implants are noted. CT ABDOMEN PELVIS FINDINGS Hepatobiliary: The liver is unremarkable in appearance. The gallbladder is mildly distended. The common bile duct remains normal in caliber. Pancreas: The pancreas is within normal limits. Spleen: The spleen is unremarkable in appearance. Adrenals/Urinary Tract: The adrenal glands are unremarkable in appearance. Nonspecific perinephric stranding is noted bilaterally. A nonobstructing 6 mm stone is noted at the  lower pole of the left kidney. There is no evidence of hydronephrosis. No obstructing ureteral stones are seen. Stomach/Bowel: There is diffuse pneumatosis involving the cecum and ascending colon, with distention of the ascending colon to 7.4 cm in diameter. Diffuse surrounding soft tissue inflammation and fluid are seen. Bowel ischemia is a concern. There is wall thickening along the hepatic flexure of the colon, which may reflect an infectious or inflammatory process. No definite mass was seen in April. The remainder of the colon is unremarkable. The small bowel is grossly unremarkable in appearance. The stomach is  unremarkable in appearance. Vascular/Lymphatic: Diffuse calcification is seen along the abdominal aorta and its branches. There is likely severe luminal narrowing at the aortic bifurcation. The inferior vena cava is grossly unremarkable. No retroperitoneal lymphadenopathy is seen. No pelvic sidewall lymphadenopathy is identified. Reproductive: The bladder is decompressed, with a Foley catheter in place. The patient is status post hysterectomy. Trace free fluid within the pelvis may reflect the colonic process. Other: No additional soft tissue abnormalities are seen. Musculoskeletal: No acute osseous abnormalities are identified. The visualized musculature is unremarkable in appearance. IMPRESSION: 1. Diffuse pneumatosis involving the cecum and ascending colon, with distention of the ascending colon to 7.4 cm in diameter. Diffuse surrounding soft tissue inflammation and fluid seen. This is concerning for bowel ischemia. Wall thickening along the hepatic flexure of the colon may reflect an underlying infectious or inflammatory process. No definite mass was seen in April. 2. Mild hazy bibasilar airspace opacities may reflect pneumonia. 3. Diffuse calcification along the abdominal aorta and its branches, with likely severe luminal narrowing at the aortic bifurcation. 4. Scattered coronary calcifications. 5. Nonobstructing 6 mm stone at the lower pole of the left kidney. These results were called by telephone at the time of interpretation on 10/14/2017 at 4:23 am to Dr. Jola Schmidt, who verbally acknowledged these results. Electronically Signed   By: Garald Balding M.D.   On: 10/14/2017 04:24   Dg Chest Portable 1 View  Result Date: 10/14/2017 CLINICAL DATA:  Acute onset of shortness of breath.  Sepsis. EXAM: PORTABLE CHEST 1 VIEW COMPARISON:  CT of the chest performed 08/18/2017 FINDINGS: The lungs are hypoexpanded but appear grossly clear. Known tiny pulmonary nodules are not well characterized on radiograph. There  is no evidence of focal opacification, pleural effusion or pneumothorax. The cardiomediastinal silhouette is within normal limits. No acute osseous abnormalities are seen. IMPRESSION: Lungs hypoexpanded but grossly clear. Electronically Signed   By: Garald Balding M.D.   On: 10/14/2017 03:34    Review of Systems  Constitutional: Positive for chills, diaphoresis and fever.  HENT: Negative.   Eyes: Negative.   Respiratory: Positive for cough.   Cardiovascular: Negative.   Gastrointestinal: Positive for abdominal pain.  Genitourinary: Negative.   Musculoskeletal: Negative.   Skin: Negative.   Neurological: Positive for weakness.  Endo/Heme/Allergies: Negative.   Psychiatric/Behavioral: Negative.     Blood pressure 135/60, pulse (!) 143, temperature (!) 102 F (38.9 C), resp. rate (!) 42, SpO2 94 %. Physical Exam  Constitutional: She appears well-developed and well-nourished. She appears distressed.  HENT:  Head: Normocephalic and atraumatic.  Mouth/Throat: No oropharyngeal exudate.  Eyes: Conjunctivae and EOM are normal. Pupils are equal, round, and reactive to light.  Neck: Normal range of motion. Neck supple.  Cardiovascular: Normal rate, regular rhythm and normal heart sounds.  Respiratory: No stridor. She is in respiratory distress. She has rales.  GI: She exhibits distension. There is tenderness. There is rebound.  Musculoskeletal: Normal range of motion. She exhibits no tenderness or deformity.  Neurological: She exhibits normal muscle tone. Coordination normal.  Mental status decreased from sepsis  Skin: Skin is warm. No rash noted. She is diaphoretic.  Psychiatric: She has a normal mood and affect. Her behavior is normal. Thought content normal.     Assessment/Plan The patient appears to have sepsis likely related to dead right colon. I would recommend emergent exploration for possible bowel resection. I have discussed with her and her family the risks and benefits of the  surgery including the risk of ostomy, prolonged mechanical ventilation and death and they understand and wish to proceed.  Joanna Roof, MD 10/14/2017, 5:12 AM

## 2017-10-14 NOTE — Assessment & Plan Note (Signed)
Hydrate aggressively Track lactate and creat again at 7pm 10/14/2017

## 2017-10-14 NOTE — Assessment & Plan Note (Signed)
Due to dead bowel and plaop Post op resp failure Vent dynschrony + du eto pain   Plan Full vent support Control pain

## 2017-10-15 ENCOUNTER — Inpatient Hospital Stay (HOSPITAL_COMMUNITY): Payer: Medicare Other

## 2017-10-15 ENCOUNTER — Encounter (HOSPITAL_COMMUNITY): Payer: Self-pay | Admitting: General Surgery

## 2017-10-15 LAB — HEPATIC FUNCTION PANEL
ALBUMIN: 1.6 g/dL — AB (ref 3.5–5.0)
ALBUMIN: 1.4 g/dL — AB (ref 3.5–5.0)
ALK PHOS: 98 U/L (ref 38–126)
ALK PHOS: 99 U/L (ref 38–126)
ALT: 828 U/L — ABNORMAL HIGH (ref 14–54)
ALT: 708 U/L — ABNORMAL HIGH (ref 14–54)
AST: 1586 U/L — ABNORMAL HIGH (ref 15–41)
AST: 1016 U/L — ABNORMAL HIGH (ref 15–41)
BILIRUBIN DIRECT: 0.3 mg/dL (ref 0.1–0.5)
BILIRUBIN INDIRECT: 0.2 mg/dL — AB (ref 0.3–0.9)
BILIRUBIN INDIRECT: 0.6 mg/dL (ref 0.3–0.9)
BILIRUBIN TOTAL: 0.5 mg/dL (ref 0.3–1.2)
BILIRUBIN TOTAL: 0.9 mg/dL (ref 0.3–1.2)
Bilirubin, Direct: 0.3 mg/dL (ref 0.1–0.5)
TOTAL PROTEIN: 4.2 g/dL — AB (ref 6.5–8.1)
Total Protein: 3.8 g/dL — ABNORMAL LOW (ref 6.5–8.1)

## 2017-10-15 LAB — CK TOTAL AND CKMB (NOT AT ARMC)
CK TOTAL: 3332 U/L — AB (ref 38–234)
CK, MB: 44.8 ng/mL — ABNORMAL HIGH (ref 0.5–5.0)
Relative Index: 1.3 (ref 0.0–2.5)

## 2017-10-15 LAB — CBC WITH DIFFERENTIAL/PLATELET
Basophils Absolute: 0 10*3/uL (ref 0.0–0.1)
Basophils Absolute: 0 10*3/uL (ref 0.0–0.1)
Basophils Relative: 0 %
Basophils Relative: 0 %
EOS ABS: 0 10*3/uL (ref 0.0–0.7)
EOS ABS: 0 10*3/uL (ref 0.0–0.7)
EOS PCT: 0 %
EOS PCT: 0 %
HCT: 37 % (ref 36.0–46.0)
HEMATOCRIT: 34.4 % — AB (ref 36.0–46.0)
Hemoglobin: 11.7 g/dL — ABNORMAL LOW (ref 12.0–15.0)
Hemoglobin: 11.4 g/dL — ABNORMAL LOW (ref 12.0–15.0)
LYMPHS ABS: 1.6 10*3/uL (ref 0.7–4.0)
LYMPHS ABS: 1 10*3/uL (ref 0.7–4.0)
Lymphocytes Relative: 12 %
Lymphocytes Relative: 7 %
MCH: 28.4 pg (ref 26.0–34.0)
MCH: 28.9 pg (ref 26.0–34.0)
MCHC: 31.6 g/dL (ref 30.0–36.0)
MCHC: 33.1 g/dL (ref 30.0–36.0)
MCV: 89.8 fL (ref 78.0–100.0)
MCV: 87.1 fL (ref 78.0–100.0)
MONO ABS: 0.7 10*3/uL (ref 0.1–1.0)
MONO ABS: 0.9 10*3/uL (ref 0.1–1.0)
Monocytes Relative: 5 %
Monocytes Relative: 6 %
NEUTROS PCT: 83 %
Neutro Abs: 11 10*3/uL — ABNORMAL HIGH (ref 1.7–7.7)
Neutro Abs: 12.7 10*3/uL — ABNORMAL HIGH (ref 1.7–7.7)
Neutrophils Relative %: 87 %
PLATELETS: 145 10*3/uL — AB (ref 150–400)
PLATELETS: 130 10*3/uL — AB (ref 150–400)
RBC: 4.12 MIL/uL (ref 3.87–5.11)
RBC: 3.95 MIL/uL (ref 3.87–5.11)
RDW: 15.9 % — AB (ref 11.5–15.5)
RDW: 15.6 % — AB (ref 11.5–15.5)
WBC: 13.3 10*3/uL — AB (ref 4.0–10.5)
WBC: 14.6 10*3/uL — AB (ref 4.0–10.5)

## 2017-10-15 LAB — CBC
HCT: 36.7 % (ref 36.0–46.0)
Hemoglobin: 12.1 g/dL (ref 12.0–15.0)
MCH: 29.4 pg (ref 26.0–34.0)
MCHC: 33 g/dL (ref 30.0–36.0)
MCV: 89.1 fL (ref 78.0–100.0)
Platelets: 142 10*3/uL — ABNORMAL LOW (ref 150–400)
RBC: 4.12 MIL/uL (ref 3.87–5.11)
RDW: 15.8 % — ABNORMAL HIGH (ref 11.5–15.5)
WBC: 15 10*3/uL — ABNORMAL HIGH (ref 4.0–10.5)

## 2017-10-15 LAB — BASIC METABOLIC PANEL
Anion gap: 10 (ref 5–15)
Anion gap: 9 (ref 5–15)
BUN: 47 mg/dL — AB (ref 6–20)
BUN: 46 mg/dL — AB (ref 6–20)
CALCIUM: 7 mg/dL — AB (ref 8.9–10.3)
CHLORIDE: 101 mmol/L (ref 101–111)
CO2: 21 mmol/L — ABNORMAL LOW (ref 22–32)
CO2: 21 mmol/L — AB (ref 22–32)
CREATININE: 2.72 mg/dL — AB (ref 0.44–1.00)
CREATININE: 2.22 mg/dL — AB (ref 0.44–1.00)
Calcium: 7.5 mg/dL — ABNORMAL LOW (ref 8.9–10.3)
Chloride: 103 mmol/L (ref 101–111)
GFR calc Af Amer: 25 mL/min — ABNORMAL LOW (ref >60)
GFR calc non Af Amer: 21 mL/min — ABNORMAL LOW (ref >60)
GFR, EST AFRICAN AMERICAN: 19 mL/min — AB (ref >60)
GFR, EST NON AFRICAN AMERICAN: 17 mL/min — AB (ref >60)
GLUCOSE: 112 mg/dL — AB (ref 65–99)
Glucose, Bld: 136 mg/dL — ABNORMAL HIGH (ref 65–99)
POTASSIUM: 3.6 mmol/L (ref 3.5–5.1)
Potassium: 3.9 mmol/L (ref 3.5–5.1)
SODIUM: 134 mmol/L — AB (ref 135–145)
Sodium: 131 mmol/L — ABNORMAL LOW (ref 135–145)

## 2017-10-15 LAB — LACTIC ACID, PLASMA
LACTIC ACID, VENOUS: 4.4 mmol/L — AB (ref 0.5–1.9)
LACTIC ACID, VENOUS: 3.9 mmol/L — AB (ref 0.5–1.9)
Lactic Acid, Venous: 4 mmol/L (ref 0.5–1.9)

## 2017-10-15 LAB — PHOSPHORUS: Phosphorus: 6.6 mg/dL — ABNORMAL HIGH (ref 2.5–4.6)

## 2017-10-15 LAB — URINE CULTURE: Culture: NO GROWTH

## 2017-10-15 LAB — TROPONIN I
TROPONIN I: 0.07 ng/mL — AB (ref <0.03)
Troponin I: 0.06 ng/mL (ref <0.03)

## 2017-10-15 LAB — MAGNESIUM: MAGNESIUM: 2.4 mg/dL (ref 1.7–2.4)

## 2017-10-15 MED ORDER — LACTATED RINGERS IV BOLUS (SEPSIS)
1000.0000 mL | Freq: Once | INTRAVENOUS | Status: AC
  Administered 2017-10-15: 1000 mL via INTRAVENOUS

## 2017-10-15 MED ORDER — SODIUM CHLORIDE 0.9% FLUSH: 10.0000 mL | INTRAVENOUS | Status: DC | PRN

## 2017-10-15 MED ORDER — CHLORHEXIDINE GLUCONATE CLOTH 2 % EX PADS: 6.0000 | MEDICATED_PAD | Freq: Every day | CUTANEOUS | Status: DC

## 2017-10-15 MED ORDER — PIPERACILLIN-TAZOBACTAM IN DEX 2-0.25 GM/50ML IV SOLN
2.2500 g | Freq: Three times a day (TID) | INTRAVENOUS | Status: DC
  Administered 2017-10-15 (×2): 2.25 g via INTRAVENOUS
  Filled 2017-10-15 (×3): qty 50

## 2017-10-15 MED ORDER — CHLORHEXIDINE GLUCONATE 0.12 % MT SOLN
15.0000 mL | Freq: Two times a day (BID) | OROMUCOSAL | Status: DC
  Administered 2017-10-15 – 2017-10-22 (×14): 15 mL via OROMUCOSAL
  Filled 2017-10-15 (×8): qty 15

## 2017-10-15 MED ORDER — PIPERACILLIN-TAZOBACTAM 3.375 G IVPB
3.3750 g | Freq: Three times a day (TID) | INTRAVENOUS | Status: AC
  Administered 2017-10-16 – 2017-10-22 (×21): 3.375 g via INTRAVENOUS
  Filled 2017-10-15 (×23): qty 50

## 2017-10-15 MED ORDER — PHENYLEPHRINE HCL-NACL 10-0.9 MG/250ML-% IV SOLN: 0.0000 ug/min | INTRAVENOUS | Status: DC

## 2017-10-15 MED ORDER — SODIUM CHLORIDE 0.9% FLUSH
10.0000 mL | Freq: Two times a day (BID) | INTRAVENOUS | Status: DC
  Administered 2017-10-15 – 2017-10-17 (×5): 10 mL
  Administered 2017-10-17 – 2017-10-18 (×2): 30 mL
  Administered 2017-10-19 – 2017-10-23 (×7): 10 mL

## 2017-10-15 MED ORDER — CHLORHEXIDINE GLUCONATE CLOTH 2 % EX PADS
6.0000 | MEDICATED_PAD | Freq: Every day | CUTANEOUS | Status: DC
  Administered 2017-10-15 – 2017-10-30 (×17): 6 via TOPICAL

## 2017-10-15 MED ORDER — LACTATED RINGERS IV BOLUS (SEPSIS)
2000.0000 mL | Freq: Once | INTRAVENOUS | Status: AC
  Administered 2017-10-15: 2000 mL via INTRAVENOUS

## 2017-10-15 NOTE — Progress Notes (Signed)
Pharmacy Antibiotic Note  [name] is a 72 y.o. female admitted on 11-11-17 with sepsis 2nd dead bowel.  Pharmacy has been consulted for zosyn dosing. 1/1 OR - removal dead bowel/R colectomy, wound VAC WBC 15. Cr up to 2.72 Lactate 4.4, Tm 103.6/hypothermic. Levophed added.    Plan:  Decrease zosyn to 2.25 gm IV q8h for worsening renal function  Height: 5\' 4"  (162.6 cm) Weight: 183 lb 13.8 oz (83.4 kg) IBW/kg (Calculated) : 54.7  Temp (24hrs), Avg:100.7 F (38.2 C), Min:96.4 F (35.8 C), Max:103.6 F (39.8 C)  Recent Labs  Lab 2017/11/11 0310 2017-11-11 0329 2017/11/11 0508 11-11-2017 1501 11-11-17 1539 11/11/2017 1617 11-Nov-2017 2242 10/15/17 0300  WBC  --  20.0*  --   --   --  9.4 12.3* 15.0*  CREATININE 1.20* 1.37*  --  1.43*  --   --  2.54* 2.72*  LATICACIDVEN 6.14*  --  4.38*  --  3.2*  --  5.3* 4.4*    Estimated Creatinine Clearance: 20.1 mL/min (A) (by C-G formula based on SCr of 2.72 mg/dL (H)).    Allergies  Allergen Reactions  . Codeine     Altered mental state   . Iodinated Diagnostic Agents Rash  Antimicrobials this admission:  1/1 vanc x 1 0329 1/1 zosyn >> Dose change: 1/2 ZEI> 2.25 q8  Microbiology results:  1/1 Ucx>> sent 1/1 BCx2>>sent 1/1 MRSA PCR neg  Thank you for allowing pharmacy to be a part of this patient's care.  Eudelia Bunch, Pharm.D. [phone] 10/15/2017 8:20 AM

## 2017-10-15 NOTE — Progress Notes (Signed)
CRITICAL VALUE ALERT  Critical Value: Lactic acid 4.0  Date & Time Notied:  10/15/2017 1936  Provider Notified: Elink  Orders Received/Actions taken: No orders added. Will continue to monitor.

## 2017-10-15 NOTE — Progress Notes (Signed)
[facility] Progress Note Patient Name: [name] DOB: [birth year] MRN: [id number]   Date of Service  10/15/2017  HPI/Events of Note    eICU Interventions  Labs reviewed- cr/ UO , LFTs improving Lactate high- stable Updated family     Intervention Category Intermediate Interventions: Diagnostic test evaluation  Rakesh V. Alva 10/15/2017, 8:00 PM

## 2017-10-15 NOTE — Progress Notes (Signed)
General Surgery [facility]  Assessment & Plan: POD#1 - status post ex lap with right colectomy for ischemia  Sedated on vent in ICU  VAC dressing in place  IV abx's  Improving hemodynamics  Anticipate return to OR 1/3 for removal of VAC, possible anastomosis and closure of abdomen  Discussed plans with family at bedside.  Will tentatively schedule OR for Thursday, 1/3.        [name], MD, [facility]       Office: [phone]    Chief Complaint: Ischemic right colon  Subjective: Patient sedated, comfortable, on vent in ICU.  Family at bedside, nurse at bedside.  Objective: Vital signs in last 24 hours: Temp:  [96.4 F (35.8 C)-103.6 F (39.8 C)] 97.7 F (36.5 C) (01/02 0845) Pulse Rate:  [30-151] 118 (01/02 0845) Resp:  [0-37] 20 (01/02 0845) BP: (78-170)/(40-105) 108/63 (01/02 0845) SpO2:  [84 %-100 %] 98 % (01/02 0845) FiO2 (%):  [40 %-70 %] 40 % (01/02 0744) Weight:  [83.4 kg (183 lb 13.8 oz)] 83.4 kg (183 lb 13.8 oz) (01/02 0400) Last BM Date: (PTA)  Intake/Output from previous day: 01/01 0701 - 01/02 0700 In: 10400.4 [I.V.:4008.7; IV Piggyback:6391.7] Out: 1325 [Urine:375; Emesis/NG output:800; Blood:150] Intake/Output this shift: Total I/O In: 252.8 [I.V.:252.8] Out: -   Physical Exam: HEENT - sclerae clear, mucous membranes moist Neck - soft Chest - clear bilaterally Cor - mild tachycardia Abdomen - soft, obese, mild distension; VAC intact in midline wound, small drainage; quiet Ext - no edema  Lab Results:  Recent Labs    10/14/17 2242 10/15/17 0300  WBC 12.3* 15.0*  HGB 12.2 12.1  HCT 37.9 36.7  PLT 157 142*   BMET Recent Labs    10/14/17 2242 10/15/17 0300  NA 135 134*  K 3.8 3.6  CL 103 103  CO2 21* 21*  GLUCOSE 98 136*  BUN 45* 47*  CREATININE 2.54* 2.72*  CALCIUM 7.5* 7.5*   PT/INR Recent Labs    10/14/17 0329  LABPROT 16.8*  INR 1.38   Comprehensive  Metabolic Panel:    Component Value Date/Time   NA 134 (L) 10/15/2017 0300   NA 135 10/14/2017 2242   K 3.6 10/15/2017 0300   K 3.8 10/14/2017 2242   CL 103 10/15/2017 0300   CL 103 10/14/2017 2242   CO2 21 (L) 10/15/2017 0300   CO2 21 (L) 10/14/2017 2242   BUN 47 (H) 10/15/2017 0300   BUN 45 (H) 10/14/2017 2242   CREATININE 2.72 (H) 10/15/2017 0300   CREATININE 2.54 (H) 10/14/2017 2242   GLUCOSE 136 (H) 10/15/2017 0300   GLUCOSE 98 10/14/2017 2242   CALCIUM 7.5 (L) 10/15/2017 0300   CALCIUM 7.5 (L) 10/14/2017 2242   AST 41 10/14/2017 0329   AST 15 01/13/2017 1136   ALT 23 10/14/2017 0329   ALT 15 01/13/2017 1136   ALKPHOS 125 10/14/2017 0329   ALKPHOS 82 01/13/2017 1136   BILITOT 1.1 10/14/2017 0329   BILITOT 0.5 01/13/2017 1136   PROT 6.9 10/14/2017 0329   PROT 6.5 01/13/2017 1136   ALBUMIN 2.8 (L) 10/14/2017 0329   ALBUMIN 4.0 01/13/2017 1136    Studies/Results: Ct Abdomen Pelvis Wo Contrast  Result Date: 10/14/2017 CLINICAL DATA:  Code sepsis. Tachycardia and decreased respiratory rate. Mid abdominal pain, acute onset. EXAM: CT CHEST, ABDOMEN AND PELVIS WITHOUT CONTRAST TECHNIQUE: Multidetector CT imaging of the chest, abdomen and  pelvis was performed following the standard protocol without IV contrast. COMPARISON:  CT of the abdomen and pelvis performed 01/17/2017, and CT of the chest performed 08/18/2017 FINDINGS: CT CHEST FINDINGS Cardiovascular: The heart is normal in size. Scattered coronary artery calcifications are seen. Calcification is noted at the thoracic aorta and proximal great vessels. Mediastinum/Nodes: The mediastinum is otherwise unremarkable in appearance. No mediastinal lymphadenopathy is seen. No pericardial effusion is identified. The visualized portions of the thyroid gland are unremarkable. No axillary lymphadenopathy is seen. Lungs/Pleura: Mild peripheral scarring is noted bilaterally. Mild hazy bibasilar airspace opacities may reflect pneumonia. No  definite pleural effusion or pneumothorax is seen. No dominant mass is identified. Musculoskeletal: No acute osseous abnormalities are identified. The visualized musculature is unremarkable in appearance. Bilateral breast implants are noted. CT ABDOMEN PELVIS FINDINGS Hepatobiliary: The liver is unremarkable in appearance. The gallbladder is mildly distended. The common bile duct remains normal in caliber. Pancreas: The pancreas is within normal limits. Spleen: The spleen is unremarkable in appearance. Adrenals/Urinary Tract: The adrenal glands are unremarkable in appearance. Nonspecific perinephric stranding is noted bilaterally. A nonobstructing 6 mm stone is noted at the lower pole of the left kidney. There is no evidence of hydronephrosis. No obstructing ureteral stones are seen. Stomach/Bowel: There is diffuse pneumatosis involving the cecum and ascending colon, with distention of the ascending colon to 7.4 cm in diameter. Diffuse surrounding soft tissue inflammation and fluid are seen. Bowel ischemia is a concern. There is wall thickening along the hepatic flexure of the colon, which may reflect an infectious or inflammatory process. No definite mass was seen in April. The remainder of the colon is unremarkable. The small bowel is grossly unremarkable in appearance. The stomach is unremarkable in appearance. Vascular/Lymphatic: Diffuse calcification is seen along the abdominal aorta and its branches. There is likely severe luminal narrowing at the aortic bifurcation. The inferior vena cava is grossly unremarkable. No retroperitoneal lymphadenopathy is seen. No pelvic sidewall lymphadenopathy is identified. Reproductive: The bladder is decompressed, with a Foley catheter in place. The patient is status post hysterectomy. Trace free fluid within the pelvis may reflect the colonic process. Other: No additional soft tissue abnormalities are seen. Musculoskeletal: No acute osseous abnormalities are identified. The  visualized musculature is unremarkable in appearance. IMPRESSION: 1. Diffuse pneumatosis involving the cecum and ascending colon, with distention of the ascending colon to 7.4 cm in diameter. Diffuse surrounding soft tissue inflammation and fluid seen. This is concerning for bowel ischemia. Wall thickening along the hepatic flexure of the colon may reflect an underlying infectious or inflammatory process. No definite mass was seen in April. 2. Mild hazy bibasilar airspace opacities may reflect pneumonia. 3. Diffuse calcification along the abdominal aorta and its branches, with likely severe luminal narrowing at the aortic bifurcation. 4. Scattered coronary calcifications. 5. Nonobstructing 6 mm stone at the lower pole of the left kidney. These results were called by telephone at the time of interpretation on 10/14/2017 at 4:23 am to Dr. Jola Schmidt, who verbally acknowledged these results. Electronically Signed   By: Garald Balding M.D.   On: 10/14/2017 04:24   Dg Chest 1 View  Result Date: 10/14/2017 CLINICAL DATA:  72 year old female with a history of central line placement EXAM: CHEST 1 VIEW COMPARISON:  CT 10/14/2017, plain film 10/14/2017 FINDINGS: Cardiomediastinal silhouette unchanged. Interval placement of gastric tube projected over the mediastinum and terminates in the abdomen out of the field of view. Interval placement of endotracheal tube, directed towards the right mainstem bronchus,  and may be pulled back approximately 3-4 cm for better positioning. Interval placement of right IJ central venous catheter appears to terminate superior cavoatrial junction. Low lung volumes with basilar opacities, likely atelectasis. No pneumothorax. IMPRESSION: Low lung volumes with likely atelectasis. Interval placement of right IJ central catheter with no complicating features. Endotracheal tube terminates at the carina directed towards the right mainstem bronchus. Withdrawal of approximately 3-4 cm recommended.  Gastric tube has been placed, terminating out of the field of view in the upper abdomen. These results were called by telephone at the time of interpretation on 10/14/2017 at 9:40 am to the nurse, Ms Leanna Sato, who verbally acknowledged these results. Electronically Signed   By: Corrie Mckusick D.O.   On: 10/14/2017 09:42   Ct Chest Wo Contrast  Result Date: 10/14/2017 CLINICAL DATA:  Code sepsis. Tachycardia and decreased respiratory rate. Mid abdominal pain, acute onset. EXAM: CT CHEST, ABDOMEN AND PELVIS WITHOUT CONTRAST TECHNIQUE: Multidetector CT imaging of the chest, abdomen and pelvis was performed following the standard protocol without IV contrast. COMPARISON:  CT of the abdomen and pelvis performed 01/17/2017, and CT of the chest performed 08/18/2017 FINDINGS: CT CHEST FINDINGS Cardiovascular: The heart is normal in size. Scattered coronary artery calcifications are seen. Calcification is noted at the thoracic aorta and proximal great vessels. Mediastinum/Nodes: The mediastinum is otherwise unremarkable in appearance. No mediastinal lymphadenopathy is seen. No pericardial effusion is identified. The visualized portions of the thyroid gland are unremarkable. No axillary lymphadenopathy is seen. Lungs/Pleura: Mild peripheral scarring is noted bilaterally. Mild hazy bibasilar airspace opacities may reflect pneumonia. No definite pleural effusion or pneumothorax is seen. No dominant mass is identified. Musculoskeletal: No acute osseous abnormalities are identified. The visualized musculature is unremarkable in appearance. Bilateral breast implants are noted. CT ABDOMEN PELVIS FINDINGS Hepatobiliary: The liver is unremarkable in appearance. The gallbladder is mildly distended. The common bile duct remains normal in caliber. Pancreas: The pancreas is within normal limits. Spleen: The spleen is unremarkable in appearance. Adrenals/Urinary Tract: The adrenal glands are unremarkable in appearance. Nonspecific  perinephric stranding is noted bilaterally. A nonobstructing 6 mm stone is noted at the lower pole of the left kidney. There is no evidence of hydronephrosis. No obstructing ureteral stones are seen. Stomach/Bowel: There is diffuse pneumatosis involving the cecum and ascending colon, with distention of the ascending colon to 7.4 cm in diameter. Diffuse surrounding soft tissue inflammation and fluid are seen. Bowel ischemia is a concern. There is wall thickening along the hepatic flexure of the colon, which may reflect an infectious or inflammatory process. No definite mass was seen in April. The remainder of the colon is unremarkable. The small bowel is grossly unremarkable in appearance. The stomach is unremarkable in appearance. Vascular/Lymphatic: Diffuse calcification is seen along the abdominal aorta and its branches. There is likely severe luminal narrowing at the aortic bifurcation. The inferior vena cava is grossly unremarkable. No retroperitoneal lymphadenopathy is seen. No pelvic sidewall lymphadenopathy is identified. Reproductive: The bladder is decompressed, with a Foley catheter in place. The patient is status post hysterectomy. Trace free fluid within the pelvis may reflect the colonic process. Other: No additional soft tissue abnormalities are seen. Musculoskeletal: No acute osseous abnormalities are identified. The visualized musculature is unremarkable in appearance. IMPRESSION: 1. Diffuse pneumatosis involving the cecum and ascending colon, with distention of the ascending colon to 7.4 cm in diameter. Diffuse surrounding soft tissue inflammation and fluid seen. This is concerning for bowel ischemia. Wall thickening along the hepatic flexure  of the colon may reflect an underlying infectious or inflammatory process. No definite mass was seen in April. 2. Mild hazy bibasilar airspace opacities may reflect pneumonia. 3. Diffuse calcification along the abdominal aorta and its branches, with likely  severe luminal narrowing at the aortic bifurcation. 4. Scattered coronary calcifications. 5. Nonobstructing 6 mm stone at the lower pole of the left kidney. These results were called by telephone at the time of interpretation on 10/14/2017 at 4:23 am to Dr. Jola Schmidt, who verbally acknowledged these results. Electronically Signed   By: Garald Balding M.D.   On: 10/14/2017 04:24   Dg Chest Port 1 View  Result Date: 10/15/2017 CLINICAL DATA:  Endotracheal tube position EXAM: PORTABLE CHEST 1 VIEW COMPARISON:  10/14/2017 FINDINGS: Endotracheal tube in good position. Right jugular central venous catheter tip in the lower SVC unchanged. NG coiled in the stomach Progression of bibasilar airspace disease most likely atelectasis. Progression of small bilateral effusions. Increased vascular congestion likely due IMPRESSION: Endotracheal tube in good position Progression of bibasilar airspace disease and bilateral effusions. Probable fluid overload. Electronically Signed   By: Franchot Gallo M.D.   On: 10/15/2017 06:39   Dg Chest Port 1 View  Result Date: 10/14/2017 CLINICAL DATA:  Check endotracheal tube position EXAM: PORTABLE CHEST 1 VIEW COMPARISON:  Film from earlier in the same day. FINDINGS: The endotracheal tube is been withdrawn and now lies 3 cm above the carina. Nasogastric catheter is noted within the stomach. Right jugular central line is seen. The lungs are well aerated bilaterally. Mild right basilar atelectasis is noted. IMPRESSION: Endotracheal tube in satisfactory position. Mild right basilar atelectasis. Electronically Signed   By: Inez Catalina M.D.   On: 10/14/2017 11:44   Dg Chest Portable 1 View  Result Date: 10/14/2017 CLINICAL DATA:  Acute onset of shortness of breath.  Sepsis. EXAM: PORTABLE CHEST 1 VIEW COMPARISON:  CT of the chest performed 08/18/2017 FINDINGS: The lungs are hypoexpanded but appear grossly clear. Known tiny pulmonary nodules are not well characterized on radiograph. There  is no evidence of focal opacification, pleural effusion or pneumothorax. The cardiomediastinal silhouette is within normal limits. No acute osseous abnormalities are seen. IMPRESSION: Lungs hypoexpanded but grossly clear. Electronically Signed   By: Garald Balding M.D.   On: 10/14/2017 03:34      Ebony Rickel M 10/15/2017  Patient ID: [name], female   DOB: Sep 19, 1946, 72 y.o.   MRN: [id number]

## 2017-10-15 NOTE — Progress Notes (Signed)
Pharmacy Antibiotic Note  [name] is a 73 y.o. female admitted on 2017/10/22 with sepsis 2nd dead bowel.  Pharmacy has been consulted for zosyn dosing. 1/1 OR - removal dead bowel/R colectomy, wound VAC  SCr improved to 2.22, CrCl ~25 ml/min   Plan:  Adjust Zosyn 3.375gm IV q8h (4hr extended infusions) for improved renal function  Height: 5\' 4"  (162.6 cm) Weight: 183 lb 13.8 oz (83.4 kg) IBW/kg (Calculated) : 54.7  Temp (24hrs), Avg:100.1 F (37.8 C), Min:96.4 F (35.8 C), Max:103.3 F (39.6 C)  Recent Labs  Lab 2017/10/22 0329  Oct 22, 2017 1501 10/22/2017 1539 Oct 22, 2017 1617 22-Oct-2017 2242 10/15/17 0300 10/15/17 0759 10/15/17 1839  WBC 20.0*  --   --   --  9.4 12.3* 15.0* 13.3* 14.6*  CREATININE 1.37*  --  1.43*  --   --  2.54* 2.72*  --  2.22*  LATICACIDVEN  --    < >  --  3.2*  --  5.3* 4.4* 3.9* 4.0*   < > = values in this interval not displayed.    Estimated Creatinine Clearance: 24.6 mL/min (A) (by C-G formula based on SCr of 2.22 mg/dL (H)).    Allergies  Allergen Reactions  . Codeine     Altered mental state   . Iodinated Diagnostic Agents Rash  Antimicrobials this admission:  1/1 vanc x 1 0329 1/1 zosyn >> Dose change: 1/2 ZEI> 2.25 q8 > back to 4-hr extended infusions q8h Microbiology results:  1/1 Ucx>> NGF 1/1 BCx2>>ngtd 1/1 MRSA PCR neg  Thank you for allowing pharmacy to be a part of this patient's care.  Peggyann Juba, PharmD, BCPS Pager: [phone] 10/15/2017 8:30 PM

## 2017-10-15 NOTE — Progress Notes (Signed)
CRITICAL VALUE ALERT  Critical Value: Lactic Acid 5.3  Provider Notified: Ramachandran  Orders Received/Actions taken: Orders recieved and administered

## 2017-10-15 NOTE — Progress Notes (Signed)
Initial Nutrition Assessment  DOCUMENTATION CODES:   Obesity unspecified  INTERVENTION:   Please consult RD if nutrition support warranted or initiated.  If medically appropriate recommend tube feeding of Vital High Protein at 50 ml/hr (1200 ml) Providing 1200 kcal, 105 g protein, and 1008 mL free water  NUTRITION DIAGNOSIS:   Inadequate oral intake related to inability to eat as evidenced by NPO status.  GOAL:   Provide needs based on ASPEN/SCCM guidelines  MONITOR:   Vent status, Weight trends, I & O's, Labs  REASON FOR ASSESSMENT:   Ventilator    ASSESSMENT:   Pt with PMH of GERD, DM, HTN, PVD, HLD, HTN presents with abdominal pain found necrosis of proximal colon. Pt is s/p emergent ex lap with extended right colectomy and application of abdominal vacuum dressing on 10/14/17. Pt required intubation r/t post op respiratory failure.    No historians at bedside.  Pt remains NPO for bowel rest  Patient is currently intubated on ventilator support MV: 13.2 L/min Temp (24hrs), Avg:100.2 F (37.9 C), Min:96.4 F (35.8 C), Max:103.6 F (39.8 C)  Propofol: 4.7 ml/hr providing 124 kcal.   Labs reviewed; Na 134, Phosphorus 6.6, Albumin 1.6  Medications reviewed; Protonix, Propofol   NUTRITION - FOCUSED PHYSICAL EXAM:    Most Recent Value  Orbital Region  No depletion  Upper Arm Region  No depletion  Thoracic and Lumbar Region  No depletion  Buccal Region  Unable to assess  Temple Region  No depletion  Clavicle Bone Region  No depletion  Clavicle and Acromion Bone Region  No depletion  Scapular Bone Region  No depletion  Dorsal Hand  No depletion  Patellar Region  No depletion  Anterior Thigh Region  No depletion  Posterior Calf Region  No depletion  Edema (RD Assessment)  None     Diet Order:  Diet NPO time specified  EDUCATION NEEDS:   No education needs have been identified at this time  Skin:  Skin Assessment: Reviewed RN Assessment  Last BM:   Unknown BM Date  Height:   Ht Readings from Last 1 Encounters:  10/14/17 5\' 4"  (1.626 m)    Weight:   Wt Readings from Last 1 Encounters:  10/15/17 183 lb 13.8 oz (83.4 kg)    Ideal Body Weight:  54.5 kg  BMI:  Body mass index is 31.56 kg/m.  Estimated Nutritional Needs:   Kcal:  1000-1200  Protein:  109 grams  Fluid:  >/= 2 L/d  Parks Ranger, MS, RDN, LDN 10/15/2017 12:57 PM

## 2017-10-15 NOTE — Progress Notes (Signed)
PULMONARY / CRITICAL CARE MEDICINE   Name: [name] MRN: [id number] DOB: 1945-10-25 PCP Leanna Battles, MD LOS 1 as of 10/15/2017     ADMISSION DATE:  10/14/2017 CONSULTATION DATE:  10/15/2017   REFERRING MD:  Surgery  CHIEF COMPLAINT:  Post op respiratory failure  HISTORY OF PRESENT ILLNESS:   72 year old female with multiple medical problems.  Presented acutely with abdominal pain found to have pneumatosis of the right colon on CT scan.  Status post emergent laparotomy on the morning of October 14, 2017 with extended right colectomy and application of abdominal vacuum dressing by Dr. Autumn Messing and estimated blood loss of 100 cc postoperatively.  Patient was sent to the intensive care unit in the intubated state and critical care medicine was consulted for postoperative acute respiratory failure and medical management, RN reports pain, hyp0ertension, vent dysnchrony in ICU. ET tube is at carina     VITAL SIGNS: BP 108/63   Pulse (!) 118   Temp 97.7 F (36.5 C)   Resp 20   Ht 5\' 4"  (1.626 m)   Wt 83.4 kg (183 lb 13.8 oz)   SpO2 98%   BMI 31.56 kg/m   HEMODYNAMICS:    VENTILATOR SETTINGS: Vent Mode: PRVC FiO2 (%):  [40 %-70 %] 40 % Set Rate:  [18 bmp] 18 bmp Vt Set:  [440 mL] 440 mL PEEP:  [5 cmH20] 5 cmH20 Plateau Pressure:  [16 cmH20-20 cmH20] 19 cmH20  INTAKE / OUTPUT: I/O last 3 completed shifts: In: 11400.4 [I.V.:5008.7; IV Piggyback:6391.7] Out: 1445 [Urine:495; Emesis/NG output:800; Blood:150]     EXAM  General: Sedated on vent HEENT: Endotracheal tube to ventilator with NG tube to intermittent wall suction PSY: Sedated Neuro: Moves all extremities x4 when sedation lightened CV: Heart sounds are regular, currently on levo at 22 mics PULM: Decreased breath sounds in the bases. GI: Abdominal wound VAC in place to suction, no bowel sounds Extremities: warm/dry, 1+ edema  Skin: no rashes or lesions     LABS  PULMONARY Recent Labs  Lab  10/14/17 0310 10/14/17 0330 10/14/17 0641  PHART  --  7.527* 7.389  PCO2ART  --  23.8* 36.9  PO2ART  --  205* 239.0*  HCO3  --  19.6* 22.3  TCO2 20*  --  23  O2SAT  --  98.7 100.0    CBC Recent Labs  Lab 10/14/17 2242 10/15/17 0300 10/15/17 0759  HGB 12.2 12.1 11.7*  HCT 37.9 36.7 37.0  WBC 12.3* 15.0* 13.3*  PLT 157 142* 145*    COAGULATION Recent Labs  Lab 10/14/17 0329  INR 1.38    CARDIAC   Recent Labs  Lab 10/14/17 0329 10/14/17 1501 10/15/17 0300 10/15/17 0759  TROPONINI 0.07* 0.06* 0.07* 0.06*   No results for input(s): PROBNP in the last 168 hours.   CHEMISTRY Recent Labs  Lab 10/14/17 0310 10/14/17 0329 10/14/17 0641 10/14/17 1501 10/14/17 2242 10/15/17 0300 10/15/17 0759  NA 136 135 137 136 135 134*  --   K 3.4* 3.3* 4.6 3.3* 3.8 3.6  --   CL 98* 98*  --  105 103 103  --   CO2  --  21*  --  22 21* 21*  --   GLUCOSE 189* 186*  --  112* 98 136*  --   BUN 37* 39*  --  43* 45* 47*  --   CREATININE 1.20* 1.37*  --  1.43* 2.54* 2.72*  --   CALCIUM  --  9.1  --  7.4* 7.5* 7.5*  --   MG  --   --   --  1.2*  --   --  2.4  PHOS  --   --   --  2.9  --   --  6.6*   Estimated Creatinine Clearance: 20.1 mL/min (A) (by C-G formula based on SCr of 2.72 mg/dL (H)).   LIVER Recent Labs  Lab 10/14/17 0329 10/15/17 0759  AST 41 1,586*  ALT 23 828*  ALKPHOS 125 98  BILITOT 1.1 0.5  PROT 6.9 4.2*  ALBUMIN 2.8* 1.6*  INR 1.38  --      INFECTIOUS Recent Labs  Lab 10/14/17 2242 10/15/17 0300 10/15/17 0759  LATICACIDVEN 5.3* 4.4* 3.9*     ENDOCRINE CBG (last 3)  No results for input(s): GLUCAP in the last 72 hours.       IMAGING x48h  - image(s) personally visualized  -   highlighted in bold Ct Abdomen Pelvis Wo Contrast  Result Date: 10/14/2017 CLINICAL DATA:  Code sepsis. Tachycardia and decreased respiratory rate. Mid abdominal pain, acute onset. EXAM: CT CHEST, ABDOMEN AND PELVIS WITHOUT CONTRAST TECHNIQUE: Multidetector CT  imaging of the chest, abdomen and pelvis was performed following the standard protocol without IV contrast. COMPARISON:  CT of the abdomen and pelvis performed 01/17/2017, and CT of the chest performed 08/18/2017 FINDINGS: CT CHEST FINDINGS Cardiovascular: The heart is normal in size. Scattered coronary artery calcifications are seen. Calcification is noted at the thoracic aorta and proximal great vessels. Mediastinum/Nodes: The mediastinum is otherwise unremarkable in appearance. No mediastinal lymphadenopathy is seen. No pericardial effusion is identified. The visualized portions of the thyroid gland are unremarkable. No axillary lymphadenopathy is seen. Lungs/Pleura: Mild peripheral scarring is noted bilaterally. Mild hazy bibasilar airspace opacities may reflect pneumonia. No definite pleural effusion or pneumothorax is seen. No dominant mass is identified. Musculoskeletal: No acute osseous abnormalities are identified. The visualized musculature is unremarkable in appearance. Bilateral breast implants are noted. CT ABDOMEN PELVIS FINDINGS Hepatobiliary: The liver is unremarkable in appearance. The gallbladder is mildly distended. The common bile duct remains normal in caliber. Pancreas: The pancreas is within normal limits. Spleen: The spleen is unremarkable in appearance. Adrenals/Urinary Tract: The adrenal glands are unremarkable in appearance. Nonspecific perinephric stranding is noted bilaterally. A nonobstructing 6 mm stone is noted at the lower pole of the left kidney. There is no evidence of hydronephrosis. No obstructing ureteral stones are seen. Stomach/Bowel: There is diffuse pneumatosis involving the cecum and ascending colon, with distention of the ascending colon to 7.4 cm in diameter. Diffuse surrounding soft tissue inflammation and fluid are seen. Bowel ischemia is a concern. There is wall thickening along the hepatic flexure of the colon, which may reflect an infectious or inflammatory process.  No definite mass was seen in April. The remainder of the colon is unremarkable. The small bowel is grossly unremarkable in appearance. The stomach is unremarkable in appearance. Vascular/Lymphatic: Diffuse calcification is seen along the abdominal aorta and its branches. There is likely severe luminal narrowing at the aortic bifurcation. The inferior vena cava is grossly unremarkable. No retroperitoneal lymphadenopathy is seen. No pelvic sidewall lymphadenopathy is identified. Reproductive: The bladder is decompressed, with a Foley catheter in place. The patient is status post hysterectomy. Trace free fluid within the pelvis may reflect the colonic process. Other: No additional soft tissue abnormalities are seen. Musculoskeletal: No acute osseous abnormalities are identified. The visualized musculature is unremarkable in appearance. IMPRESSION: 1. Diffuse pneumatosis involving the cecum and  ascending colon, with distention of the ascending colon to 7.4 cm in diameter. Diffuse surrounding soft tissue inflammation and fluid seen. This is concerning for bowel ischemia. Wall thickening along the hepatic flexure of the colon may reflect an underlying infectious or inflammatory process. No definite mass was seen in April. 2. Mild hazy bibasilar airspace opacities may reflect pneumonia. 3. Diffuse calcification along the abdominal aorta and its branches, with likely severe luminal narrowing at the aortic bifurcation. 4. Scattered coronary calcifications. 5. Nonobstructing 6 mm stone at the lower pole of the left kidney. These results were called by telephone at the time of interpretation on 10/14/2017 at 4:23 am to Dr. Jola Schmidt, who verbally acknowledged these results. Electronically Signed   By: Garald Balding M.D.   On: 10/14/2017 04:24   Dg Chest 1 View  Result Date: 10/14/2017 CLINICAL DATA:  72 year old female with a history of central line placement EXAM: CHEST 1 VIEW COMPARISON:  CT 10/14/2017, plain film  10/14/2017 FINDINGS: Cardiomediastinal silhouette unchanged. Interval placement of gastric tube projected over the mediastinum and terminates in the abdomen out of the field of view. Interval placement of endotracheal tube, directed towards the right mainstem bronchus, and may be pulled back approximately 3-4 cm for better positioning. Interval placement of right IJ central venous catheter appears to terminate superior cavoatrial junction. Low lung volumes with basilar opacities, likely atelectasis. No pneumothorax. IMPRESSION: Low lung volumes with likely atelectasis. Interval placement of right IJ central catheter with no complicating features. Endotracheal tube terminates at the carina directed towards the right mainstem bronchus. Withdrawal of approximately 3-4 cm recommended. Gastric tube has been placed, terminating out of the field of view in the upper abdomen. These results were called by telephone at the time of interpretation on 10/14/2017 at 9:40 am to the nurse, Ms Leanna Sato, who verbally acknowledged these results. Electronically Signed   By: Corrie Mckusick D.O.   On: 10/14/2017 09:42   Ct Chest Wo Contrast  Result Date: 10/14/2017 CLINICAL DATA:  Code sepsis. Tachycardia and decreased respiratory rate. Mid abdominal pain, acute onset. EXAM: CT CHEST, ABDOMEN AND PELVIS WITHOUT CONTRAST TECHNIQUE: Multidetector CT imaging of the chest, abdomen and pelvis was performed following the standard protocol without IV contrast. COMPARISON:  CT of the abdomen and pelvis performed 01/17/2017, and CT of the chest performed 08/18/2017 FINDINGS: CT CHEST FINDINGS Cardiovascular: The heart is normal in size. Scattered coronary artery calcifications are seen. Calcification is noted at the thoracic aorta and proximal great vessels. Mediastinum/Nodes: The mediastinum is otherwise unremarkable in appearance. No mediastinal lymphadenopathy is seen. No pericardial effusion is identified. The visualized portions of the  thyroid gland are unremarkable. No axillary lymphadenopathy is seen. Lungs/Pleura: Mild peripheral scarring is noted bilaterally. Mild hazy bibasilar airspace opacities may reflect pneumonia. No definite pleural effusion or pneumothorax is seen. No dominant mass is identified. Musculoskeletal: No acute osseous abnormalities are identified. The visualized musculature is unremarkable in appearance. Bilateral breast implants are noted. CT ABDOMEN PELVIS FINDINGS Hepatobiliary: The liver is unremarkable in appearance. The gallbladder is mildly distended. The common bile duct remains normal in caliber. Pancreas: The pancreas is within normal limits. Spleen: The spleen is unremarkable in appearance. Adrenals/Urinary Tract: The adrenal glands are unremarkable in appearance. Nonspecific perinephric stranding is noted bilaterally. A nonobstructing 6 mm stone is noted at the lower pole of the left kidney. There is no evidence of hydronephrosis. No obstructing ureteral stones are seen. Stomach/Bowel: There is diffuse pneumatosis involving the cecum and ascending colon,  with distention of the ascending colon to 7.4 cm in diameter. Diffuse surrounding soft tissue inflammation and fluid are seen. Bowel ischemia is a concern. There is wall thickening along the hepatic flexure of the colon, which may reflect an infectious or inflammatory process. No definite mass was seen in April. The remainder of the colon is unremarkable. The small bowel is grossly unremarkable in appearance. The stomach is unremarkable in appearance. Vascular/Lymphatic: Diffuse calcification is seen along the abdominal aorta and its branches. There is likely severe luminal narrowing at the aortic bifurcation. The inferior vena cava is grossly unremarkable. No retroperitoneal lymphadenopathy is seen. No pelvic sidewall lymphadenopathy is identified. Reproductive: The bladder is decompressed, with a Foley catheter in place. The patient is status post  hysterectomy. Trace free fluid within the pelvis may reflect the colonic process. Other: No additional soft tissue abnormalities are seen. Musculoskeletal: No acute osseous abnormalities are identified. The visualized musculature is unremarkable in appearance. IMPRESSION: 1. Diffuse pneumatosis involving the cecum and ascending colon, with distention of the ascending colon to 7.4 cm in diameter. Diffuse surrounding soft tissue inflammation and fluid seen. This is concerning for bowel ischemia. Wall thickening along the hepatic flexure of the colon may reflect an underlying infectious or inflammatory process. No definite mass was seen in April. 2. Mild hazy bibasilar airspace opacities may reflect pneumonia. 3. Diffuse calcification along the abdominal aorta and its branches, with likely severe luminal narrowing at the aortic bifurcation. 4. Scattered coronary calcifications. 5. Nonobstructing 6 mm stone at the lower pole of the left kidney. These results were called by telephone at the time of interpretation on 10/14/2017 at 4:23 am to Dr. Jola Schmidt, who verbally acknowledged these results. Electronically Signed   By: Garald Balding M.D.   On: 10/14/2017 04:24   Dg Chest Port 1 View  Result Date: 10/15/2017 CLINICAL DATA:  Endotracheal tube position EXAM: PORTABLE CHEST 1 VIEW COMPARISON:  10/14/2017 FINDINGS: Endotracheal tube in good position. Right jugular central venous catheter tip in the lower SVC unchanged. NG coiled in the stomach Progression of bibasilar airspace disease most likely atelectasis. Progression of small bilateral effusions. Increased vascular congestion likely due IMPRESSION: Endotracheal tube in good position Progression of bibasilar airspace disease and bilateral effusions. Probable fluid overload. Electronically Signed   By: Franchot Gallo M.D.   On: 10/15/2017 06:39   Dg Chest Port 1 View  Result Date: 10/14/2017 CLINICAL DATA:  Check endotracheal tube position EXAM: PORTABLE CHEST 1  VIEW COMPARISON:  Film from earlier in the same day. FINDINGS: The endotracheal tube is been withdrawn and now lies 3 cm above the carina. Nasogastric catheter is noted within the stomach. Right jugular central line is seen. The lungs are well aerated bilaterally. Mild right basilar atelectasis is noted. IMPRESSION: Endotracheal tube in satisfactory position. Mild right basilar atelectasis. Electronically Signed   By: Inez Catalina M.D.   On: 10/14/2017 11:44   Dg Chest Portable 1 View  Result Date: 10/14/2017 CLINICAL DATA:  Acute onset of shortness of breath.  Sepsis. EXAM: PORTABLE CHEST 1 VIEW COMPARISON:  CT of the chest performed 08/18/2017 FINDINGS: The lungs are hypoexpanded but appear grossly clear. Known tiny pulmonary nodules are not well characterized on radiograph. There is no evidence of focal opacification, pleural effusion or pneumothorax. The cardiomediastinal silhouette is within normal limits. No acute osseous abnormalities are seen. IMPRESSION: Lungs hypoexpanded but grossly clear. Electronically Signed   By: Garald Balding M.D.   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 Elevated LFT post op will follow and she returns to OR 10/16/17  Rx per [facility]  Acute respiratory failure with hypoxia ([place]) Due to dead bowel and plaop Post op resp failure Continue full ventilatory support as she is to return to the operating room on 10/16/2017  Plan Full vent support Control pain  Acute post-operative pain Start fent gtt Do versed and fent prn RASS goal 0 to -2 and vent synch as goal  AKI (acute kidney injury) ([place]) Lab Results  Component Value Date   CREATININE 2.72 (H) 10/15/2017   CREATININE 2.54 (H) 10/14/2017   CREATININE 1.43 (H) 10/14/2017   Recent Labs  Lab  10/14/17 1501 10/14/17 2242 10/15/17 0300  K 3.3* 3.8 3.6   Lactic Acid, Venous    Component Value Date/Time   LATICACIDVEN 3.9 ([place]) 10/15/2017 0759   Hydrate aggressively Follow lactic acid   Anemia Recent Labs    10/15/17 0300 10/15/17 0759  HGB 12.1 11.7*    Transfuse per protocol       FAMILY  - Updates: 10/15/2017 --> updated family at the bedside.  - Inter-disciplinary family meet or Palliative Care meeting due by:  DAy 7. Current LOS is LOS 1 days  CODE STATUS Full code    DISPO Keep in ICU      [facility] Laker Thompson ACNP Maryanna Shape PCCM Pager [phone] till 1 pm If no answer page [phone] 10/15/2017, 10:13 AM

## 2017-10-15 NOTE — Progress Notes (Signed)
CRITICAL VALUE ALERT  Critical Value: Lactic acid 4.4  Date & Time Notied: 10/15/2017 0400  Provider Notified: Ashby Dawes  Orders Received/Actions taken: Orders received and administered

## 2017-10-16 ENCOUNTER — Encounter (HOSPITAL_COMMUNITY): Payer: Self-pay | Admitting: Surgery

## 2017-10-16 ENCOUNTER — Inpatient Hospital Stay (HOSPITAL_COMMUNITY): Payer: Medicare Other | Admitting: Anesthesiology

## 2017-10-16 ENCOUNTER — Encounter (HOSPITAL_COMMUNITY)
Admission: EM | Disposition: A | Discharge status: Nursing Home (Includes ICF) | Payer: Self-pay | Source: Home / Self Care

## 2017-10-16 DIAGNOSIS — J9600 Acute respiratory failure, unspecified whether with hypoxia or hypercapnia: Secondary | ICD-10-CM

## 2017-10-16 HISTORY — PX: LAPAROTOMY: SHX154

## 2017-10-16 LAB — BLOOD GAS, ARTERIAL
ACID-BASE DEFICIT: 7.1 mmol/L — AB (ref 0.0–2.0)
Bicarbonate: 20.3 mmol/L (ref 20.0–28.0)
DRAWN BY: 270211
FIO2: 0.4
MECHVT: 440 mL
O2 Saturation: 92.8 %
PEEP/CPAP: 5 cmH2O
PO2 ART: 83 mmHg (ref 83.0–108.0)
Patient temperature: 98.6
RATE: 18 resp/min
pCO2 arterial: 52.5 mmHg — ABNORMAL HIGH (ref 32.0–48.0)
pH, Arterial: 7.213 — ABNORMAL LOW (ref 7.350–7.450)

## 2017-10-16 LAB — PHOSPHORUS: Phosphorus: 6.2 mg/dL — ABNORMAL HIGH (ref 2.5–4.6)

## 2017-10-16 LAB — CBC WITH DIFFERENTIAL/PLATELET
BASOS PCT: 0 %
Basophils Absolute: 0 10*3/uL (ref 0.0–0.1)
Eosinophils Absolute: 0 10*3/uL (ref 0.0–0.7)
Eosinophils Relative: 0 %
HEMATOCRIT: 33.6 % — AB (ref 36.0–46.0)
HEMOGLOBIN: 11.1 g/dL — AB (ref 12.0–15.0)
LYMPHS ABS: 1.2 10*3/uL (ref 0.7–4.0)
LYMPHS PCT: 6 %
MCH: 28.5 pg (ref 26.0–34.0)
MCHC: 33 g/dL (ref 30.0–36.0)
MCV: 86.2 fL (ref 78.0–100.0)
MONOS PCT: 6 %
Monocytes Absolute: 1.2 10*3/uL — ABNORMAL HIGH (ref 0.1–1.0)
Neutro Abs: 18.2 10*3/uL — ABNORMAL HIGH (ref 1.7–7.7)
Neutrophils Relative %: 88 %
Platelets: 140 10*3/uL — ABNORMAL LOW (ref 150–400)
RBC: 3.9 MIL/uL (ref 3.87–5.11)
RDW: 15.7 % — ABNORMAL HIGH (ref 11.5–15.5)
WBC: 20.6 10*3/uL — ABNORMAL HIGH (ref 4.0–10.5)

## 2017-10-16 LAB — BASIC METABOLIC PANEL
ANION GAP: 8 (ref 5–15)
BUN: 43 mg/dL — AB (ref 6–20)
CALCIUM: 6.8 mg/dL — AB (ref 8.9–10.3)
CO2: 22 mmol/L (ref 22–32)
CREATININE: 2.14 mg/dL — AB (ref 0.44–1.00)
Chloride: 100 mmol/L — ABNORMAL LOW (ref 101–111)
GFR calc Af Amer: 26 mL/min — ABNORMAL LOW (ref >60)
GFR, EST NON AFRICAN AMERICAN: 22 mL/min — AB (ref >60)
GLUCOSE: 119 mg/dL — AB (ref 65–99)
Potassium: 4.8 mmol/L (ref 3.5–5.1)
Sodium: 130 mmol/L — ABNORMAL LOW (ref 135–145)

## 2017-10-16 LAB — GLUCOSE, CAPILLARY
Glucose-Capillary: 117 mg/dL — ABNORMAL HIGH (ref 65–99)
Glucose-Capillary: 142 mg/dL — ABNORMAL HIGH (ref 65–99)

## 2017-10-16 LAB — HEPATIC FUNCTION PANEL
ALK PHOS: 115 U/L (ref 38–126)
ALT: 683 U/L — ABNORMAL HIGH (ref 14–54)
AST: 898 U/L — ABNORMAL HIGH (ref 15–41)
Albumin: 1.4 g/dL — ABNORMAL LOW (ref 3.5–5.0)
BILIRUBIN DIRECT: 0.5 mg/dL (ref 0.1–0.5)
BILIRUBIN INDIRECT: 0.4 mg/dL (ref 0.3–0.9)
BILIRUBIN TOTAL: 0.9 mg/dL (ref 0.3–1.2)
Total Protein: 3.8 g/dL — ABNORMAL LOW (ref 6.5–8.1)

## 2017-10-16 LAB — TROPONIN I: TROPONIN I: 0.04 ng/mL — AB (ref <0.03)

## 2017-10-16 LAB — CK: Total CK: 5760 U/L — ABNORMAL HIGH (ref 38–234)

## 2017-10-16 LAB — LACTIC ACID, PLASMA: LACTIC ACID, VENOUS: 2.5 mmol/L — AB (ref 0.5–1.9)

## 2017-10-16 LAB — MAGNESIUM: MAGNESIUM: 1.9 mg/dL (ref 1.7–2.4)

## 2017-10-16 SURGERY — LAPAROTOMY, EXPLORATORY
Anesthesia: General | Site: Abdomen

## 2017-10-16 MED ORDER — MIDAZOLAM HCL 2 MG/2ML IJ SOLN
INTRAMUSCULAR | Status: AC
  Filled 2017-10-16: qty 2

## 2017-10-16 MED ORDER — KETAMINE HCL 10 MG/ML IJ SOLN
INTRAMUSCULAR | Status: AC
  Filled 2017-10-16: qty 1

## 2017-10-16 MED ORDER — INSULIN ASPART 100 UNIT/ML ~~LOC~~ SOLN
0.0000 [IU] | Freq: Four times a day (QID) | SUBCUTANEOUS | Status: DC
  Administered 2017-10-17 (×3): 1 [IU] via SUBCUTANEOUS
  Administered 2017-10-17: 2 [IU] via SUBCUTANEOUS
  Administered 2017-10-18 – 2017-10-19 (×5): 1 [IU] via SUBCUTANEOUS
  Administered 2017-10-19 – 2017-10-20 (×3): 2 [IU] via SUBCUTANEOUS

## 2017-10-16 MED ORDER — LACTATED RINGERS IV SOLN
INTRAVENOUS | Status: DC | PRN
  Administered 2017-10-16: 08:00:00 via INTRAVENOUS

## 2017-10-16 MED ORDER — PHENYLEPHRINE 40 MCG/ML (10ML) SYRINGE FOR IV PUSH (FOR BLOOD PRESSURE SUPPORT)
PREFILLED_SYRINGE | INTRAVENOUS | Status: AC
  Filled 2017-10-16: qty 10

## 2017-10-16 MED ORDER — PANTOPRAZOLE SODIUM 40 MG IV SOLR
40.0000 mg | Freq: Two times a day (BID) | INTRAVENOUS | Status: DC
  Administered 2017-10-16 – 2017-10-24 (×17): 40 mg via INTRAVENOUS
  Filled 2017-10-16 (×17): qty 40

## 2017-10-16 MED ORDER — ALBUMIN HUMAN 5 % IV SOLN
INTRAVENOUS | Status: DC | PRN
  Administered 2017-10-16: 08:00:00 via INTRAVENOUS

## 2017-10-16 MED ORDER — PROPOFOL 10 MG/ML IV BOLUS
INTRAVENOUS | Status: AC
  Filled 2017-10-16: qty 20

## 2017-10-16 MED ORDER — TRACE MINERALS CR-CU-MN-SE-ZN 10-1000-500-60 MCG/ML IV SOLN
INTRAVENOUS | Status: AC
  Administered 2017-10-16: 18:00:00 via INTRAVENOUS
  Filled 2017-10-16 (×2): qty 720

## 2017-10-16 MED ORDER — MIDAZOLAM HCL 5 MG/5ML IJ SOLN
INTRAMUSCULAR | Status: DC | PRN
  Administered 2017-10-16: 2 mg via INTRAVENOUS

## 2017-10-16 MED ORDER — PHENYLEPHRINE 40 MCG/ML (10ML) SYRINGE FOR IV PUSH (FOR BLOOD PRESSURE SUPPORT)
PREFILLED_SYRINGE | INTRAVENOUS | Status: AC
  Filled 2017-10-16: qty 20

## 2017-10-16 MED ORDER — FENTANYL CITRATE (PF) 100 MCG/2ML IJ SOLN
INTRAMUSCULAR | Status: AC
  Filled 2017-10-16: qty 2

## 2017-10-16 MED ORDER — LACTATED RINGERS IV SOLN
INTRAVENOUS | Status: DC
  Administered 2017-10-16 – 2017-10-17 (×3): via INTRAVENOUS

## 2017-10-16 MED ORDER — SODIUM CHLORIDE 0.9 % IV SOLN
100.0000 mg | INTRAVENOUS | Status: AC
  Administered 2017-10-17 – 2017-10-22 (×6): 100 mg via INTRAVENOUS
  Filled 2017-10-16 (×6): qty 100

## 2017-10-16 MED ORDER — ROCURONIUM BROMIDE 50 MG/5ML IV SOSY
PREFILLED_SYRINGE | INTRAVENOUS | Status: AC
  Filled 2017-10-16: qty 5

## 2017-10-16 MED ORDER — KETAMINE HCL 10 MG/ML IJ SOLN
INTRAMUSCULAR | Status: DC | PRN
  Administered 2017-10-16 (×3): 20 mg via INTRAVENOUS

## 2017-10-16 MED ORDER — SUCCINYLCHOLINE CHLORIDE 200 MG/10ML IV SOSY
PREFILLED_SYRINGE | INTRAVENOUS | Status: AC
  Filled 2017-10-16: qty 10

## 2017-10-16 MED ORDER — 0.9 % SODIUM CHLORIDE (POUR BTL) OPTIME
TOPICAL | Status: DC | PRN
  Administered 2017-10-16: 2000 mL
  Administered 2017-10-16: 1000 mL

## 2017-10-16 MED ORDER — FAT EMULSION 20 % IV EMUL
240.0000 mL | INTRAVENOUS | Status: AC
  Administered 2017-10-16: 240 mL via INTRAVENOUS
  Filled 2017-10-16: qty 250

## 2017-10-16 MED ORDER — PHENYLEPHRINE HCL 10 MG/ML IJ SOLN
INTRAMUSCULAR | Status: AC
  Filled 2017-10-16: qty 1

## 2017-10-16 MED ORDER — SODIUM CHLORIDE 0.9 % IV SOLN
200.0000 mg | Freq: Once | INTRAVENOUS | Status: AC
  Administered 2017-10-16: 200 mg via INTRAVENOUS
  Filled 2017-10-16: qty 200

## 2017-10-16 MED ORDER — LIDOCAINE 2% (20 MG/ML) 5 ML SYRINGE
INTRAMUSCULAR | Status: AC
  Filled 2017-10-16: qty 5

## 2017-10-16 MED ORDER — PHENYLEPHRINE 40 MCG/ML (10ML) SYRINGE FOR IV PUSH (FOR BLOOD PRESSURE SUPPORT)
PREFILLED_SYRINGE | INTRAVENOUS | Status: DC | PRN
  Administered 2017-10-16 (×6): 200 ug via INTRAVENOUS

## 2017-10-16 MED ORDER — PHENYLEPHRINE HCL 10 MG/ML IJ SOLN
INTRAVENOUS | Status: DC | PRN
  Administered 2017-10-16: 100 ug/min via INTRAVENOUS

## 2017-10-16 SURGICAL SUPPLY — 41 items
APPLICATOR COTTON TIP 6IN STRL (MISCELLANEOUS) ×3 IMPLANT
BLADE EXTENDED COATED 6.5IN (ELECTRODE) IMPLANT
BLADE HEX COATED 2.75 (ELECTRODE) ×3 IMPLANT
CHLORAPREP W/TINT 26ML (MISCELLANEOUS) ×3 IMPLANT
COVER MAYO STAND STRL (DRAPES) IMPLANT
COVER SURGICAL LIGHT HANDLE (MISCELLANEOUS) ×3 IMPLANT
DRAPE LAPAROSCOPIC ABDOMINAL (DRAPES) ×3 IMPLANT
DRAPE WARM FLUID 44X44 (DRAPE) IMPLANT
DRSG VAC ATS LRG SENSATRAC (GAUZE/BANDAGES/DRESSINGS) ×2 IMPLANT
ELECT REM PT RETURN 15FT ADLT (MISCELLANEOUS) ×3 IMPLANT
GAUZE SPONGE 4X4 12PLY STRL (GAUZE/BANDAGES/DRESSINGS) ×3 IMPLANT
GLOVE BIOGEL PI IND STRL 7.0 (GLOVE) ×2 IMPLANT
GLOVE BIOGEL PI INDICATOR 7.0 (GLOVE) ×1
GLOVE SURG ORTHO 8.0 STRL STRW (GLOVE) ×3 IMPLANT
GOWN STRL REUS W/TWL LRG LVL3 (GOWN DISPOSABLE) ×3 IMPLANT
GOWN STRL REUS W/TWL XL LVL3 (GOWN DISPOSABLE) ×6 IMPLANT
HANDLE SUCTION POOLE (INSTRUMENTS) IMPLANT
KIT BASIN OR (CUSTOM PROCEDURE TRAY) ×3 IMPLANT
LIGASURE IMPACT 36 18CM CVD LR (INSTRUMENTS) ×2 IMPLANT
NS IRRIG 1000ML POUR BTL (IV SOLUTION) ×3 IMPLANT
PACK GENERAL/GYN (CUSTOM PROCEDURE TRAY) ×3 IMPLANT
RELOAD PROXIMATE 75MM BLUE (ENDOMECHANICALS) ×6 IMPLANT
RELOAD STAPLE 75 3.8 BLU REG (ENDOMECHANICALS) IMPLANT
SPONGE ABDOMINAL VAC ABTHERA (MISCELLANEOUS) ×2 IMPLANT
SPONGE LAP 18X18 X RAY DECT (DISPOSABLE) IMPLANT
STAPLER PROXIMATE 75MM BLUE (STAPLE) ×2 IMPLANT
STAPLER VISISTAT 35W (STAPLE) ×3 IMPLANT
SUCTION POOLE HANDLE (INSTRUMENTS)
SUT NOV 1 T60/GS (SUTURE) IMPLANT
SUT SILK 2 0 (SUTURE) ×3
SUT SILK 2 0 SH CR/8 (SUTURE) ×2 IMPLANT
SUT SILK 2-0 18XBRD TIE 12 (SUTURE) ×2 IMPLANT
SUT SILK 3 0 (SUTURE) ×3
SUT SILK 3 0 SH CR/8 (SUTURE) ×2 IMPLANT
SUT SILK 3-0 18XBRD TIE 12 (SUTURE) ×2 IMPLANT
SUT VICRYL 2 0 18  UND BR (SUTURE)
SUT VICRYL 2 0 18 UND BR (SUTURE) IMPLANT
TOWEL OR 17X26 10 PK STRL BLUE (TOWEL DISPOSABLE) ×6 IMPLANT
TRAY FOLEY W/METER SILVER 16FR (SET/KITS/TRAYS/PACK) IMPLANT
WATER STERILE IRR 1000ML POUR (IV SOLUTION) ×3 IMPLANT
YANKAUER SUCT BULB TIP NO VENT (SUCTIONS) IMPLANT

## 2017-10-16 NOTE — Brief Op Note (Signed)
10/16/2017  8:42 AM  PATIENT:  [name]  72 y.o. female  PRE-OPERATIVE DIAGNOSIS:  intestinal ischemia, open abdomen  POST-OPERATIVE DIAGNOSIS:  intestinal ischemia, open abdomen  PROCEDURE:  Procedure(s): EXPLORATORY LAPAROTOMY, PARTIAL OMENTECTOMY, RESECTION ISCHEMIC ILEUM 355EZ, APPLICATION OF VAC ABDOMINAL DRESSING (N/A)  SURGEON:  Surgeon(s) and Role:    Armandina Gemma, MD - Primary  ANESTHESIA:   general  EBL:  minimal  BLOOD ADMINISTERED:none  DRAINS: VAC abdominal dressing   LOCAL MEDICATIONS USED:  NONE  SPECIMEN:  Excision  DISPOSITION OF SPECIMEN:  PATHOLOGY  COUNTS:  YES  TOURNIQUET:  * No tourniquets in log *  DICTATION: .Other Dictation: Dictation Number [id number]  PLAN OF CARE: Admit to inpatient   PATIENT DISPOSITION:  ICU - intubated and hemodynamically stable.   Delay start of Pharmacological VTE agent (>24hrs) due to surgical blood loss or risk of bleeding: yes  Armandina Gemma, MD [place]: [phone]

## 2017-10-16 NOTE — Transfer of Care (Signed)
Immediate Anesthesia Transfer of Care Note  Patient: [name]  Procedure(s) Performed: EXPLORATORY LAPAROTOMY, PARTIAL OMENTECTOMY, RESECTION ISCHEMIC ILEUM 574VT, APPLICATION OF VAC ABDOMINAL DRESSING (N/A Abdomen)  Patient Location: ICU  Anesthesia Type:General  Level of Consciousness: Patient remains intubated per anesthesia plan  Airway & Oxygen Therapy: Patient placed on Ventilator (see vital sign flow sheet for setting)  Post-op Assessment: Report given to RN and Post -op Vital signs reviewed and stable  Post vital signs: Reviewed and stable  Last Vitals:  Vitals:   10/16/17 0530 10/16/17 0600  BP: 138/65 (!) 146/67  Pulse: (!) 54 (!) 57  Resp: (!) 21 (!) 27  Temp: 37.9 C 37.9 C  SpO2: 98% 100%    Last Pain:  Vitals:   10/15/17 1600  TempSrc: Bladder         Complications: No apparent anesthesia complications

## 2017-10-16 NOTE — Op Note (Signed)
[name], [name]                ACCOUNT NO.:  [id number]  MEDICAL RECORD NO.:  [id number]  LOCATION:  [facility]                         FACILITY:  [facility]  PHYSICIAN:  [name], MD      DATE OF BIRTH:  1946/03/08  DATE OF PROCEDURE:  10/16/2017                              OPERATIVE REPORT   PREOPERATIVE DIAGNOSIS:  Intestinal ischemia.  POSTOPERATIVE DIAGNOSIS:  Intestinal ischemia.  PROCEDURE:  Exploratory laparotomy, partial omentectomy, resection of ischemic ileum (539 cm), and application of VAC abdominal dressing.  SURGEON:  Armandina Gemma, MD  ANESTHESIA:  General.  ESTIMATED BLOOD LOSS:  Minimal.  PREPARATION:  Chlorhexidine.  COMPLICATIONS:  None.  INDICATIONS:  The patient is a 72 year old white female, who had come to the operating room on October 14, 2017, for exploratory laparotomy.  She was found to have an ischemic right colon and underwent right colectomy. A vacuum abdominal dressing was placed due to the appearance of intestinal ischemia.  The patient was taken to the intensive care unit and resuscitated.  She was maintained on the ventilator.  She was returned to the operating room today for vacuum dressing removal and second-look laparotomy.  BODY OF REPORT:  Procedure was done in OR #4 at the [facility].  The patient was brought to the operating room, placed in supine position on the operating room table.  Following administration of general anesthesia, the previous vacuum dressing was removed and the abdomen was prepped and draped in the usual aseptic fashion.  After ascertaining that an adequate level of anesthesia had been achieved, the remaining portion of the vacuum dressing was removed from the peritoneal cavity.  There was approximately 300 mL of cloudy fluid present within the peritoneal cavity, which was evacuated.  The right colon, transverse colon, descending colon, and sigmoid colon appeared viable.  The closure of the  right colon appears intact.  The small intestine, however, shows signs of infarction and ischemia in the ileum.  The small bowel was run from the ligament of Treitz distally to the stapled closed end of the ileum.  The proximal small bowel appears viable.  However, the distal small bowel was obviously ischemic or infarcted with necrosis.  Beginning at the distal stapled end and measuring back 130 cm, we reached small intestine, which appears potentially viable.  The distal small bowel was either ischemic or infarcted.  Small bowel was transected at this point, with a GIA stapler.  Due to the ischemic nature of the small bowel, the staple line did not hold and a second staple line was placed approximately 15 cm further cephalad.  This closure appears intact.  The mesentery was divided with the LigaSure and the distal 130 cm of ileum were resected and submitted to Pathology for review.  There was what appears to be infarcted or necrotic omentum present along the proximal transverse colon.  This was also resected with the LigaSure and submitted to Pathology.  Abdomen was then irrigated with warm saline which was evacuated.  Good hemostasis was noted.  All collections between loops of bowel and in the pelvis and around the liver were broken up and evacuated.  Nasogastric tube  was properly positioned within the stomach.  The remaining small bowel appears viable.  The colon appears viable.  Stomach appears viable.  The liver appears to have diffuse hepatocellular disease.  The abdominal VAC dressing was replaced in the usual fashion.  The skin was cleaned circumferentially and dried.  The adhesive barrier was applied with a good seal and placed a VAC.  The patient was prepared and taken back to the intensive care unit intubated and sedated in relatively stable condition being supported with pressors.   Armandina Gemma, [place] [facility] Office: [phone]    TMG/MEDQ  D:   10/16/2017  T:  10/16/2017  Job:  [id number]  cc:   [name], MD [address] [place] [address]

## 2017-10-16 NOTE — Anesthesia Postprocedure Evaluation (Signed)
Anesthesia Post Note  Patient: [name]  Procedure(Strong) Performed: EXPLORATORY LAPAROTOMY, PARTIAL OMENTECTOMY, RESECTION ISCHEMIC ILEUM 090BO, APPLICATION OF VAC ABDOMINAL DRESSING (N/A Abdomen)     Patient location during evaluation: SICU Anesthesia Type: General Level of consciousness: sedated Pain management: pain level controlled Vital Signs Assessment: post-procedure vital signs reviewed and stable Respiratory status: patient remains intubated per anesthesia plan Cardiovascular status: stable Postop Assessment: no apparent nausea or vomiting Anesthetic complications: no    Last Vitals:  Vitals:   10/16/17 0530 10/16/17 0600  BP: 138/65 (!) 146/67  Pulse: (!) 54 (!) 57  Resp: (!) 21 (!) 27  Temp: 37.9 C 37.9 C  SpO2: 98% 100%    Last Pain:  Vitals:   10/15/17 1600  TempSrc: Bladder                 [name]

## 2017-10-16 NOTE — Anesthesia Preprocedure Evaluation (Addendum)
Anesthesia Evaluation  Patient identified by MRN, date of birth, ID band Patient unresponsive    Reviewed: Allergy & Precautions, NPO status , Patient's Chart, lab work & pertinent test resultsPreop documentation limited or incomplete due to emergent nature of procedure.  Airway Mallampati: Intubated  TM Distance: >3 FB Neck ROM: Full    Dental no notable dental hx.    Pulmonary sleep apnea , COPD, Current Smoker,    Pulmonary exam normal breath sounds clear to auscultation       Cardiovascular hypertension, + CAD  Normal cardiovascular exam Rhythm:Regular Rate:Normal     Neuro/Psych CVA negative psych ROS   GI/Hepatic Neg liver ROS, GERD  ,  Endo/Other  diabetespancreatitis  Renal/GU Renal InsufficiencyRenal disease  negative genitourinary   Musculoskeletal negative musculoskeletal ROS (+)   Abdominal   Peds negative pediatric ROS (+)  Hematology negative hematology ROS (+)   Anesthesia Other Findings   Reproductive/Obstetrics negative OB ROS                            Anesthesia Physical Anesthesia Plan  ASA: IV  Anesthesia Plan: General   Post-op Pain Management:    Induction: Intravenous  PONV Risk Score and Plan: 2 and Ondansetron, Dexamethasone and Treatment may vary due to age or medical condition  Airway Management Planned: Oral ETT  Additional Equipment:   Intra-op Plan:   Post-operative Plan: Post-operative intubation/ventilation  Informed Consent: I have reviewed the patients History and Physical, chart, labs and discussed the procedure including the risks, benefits and alternatives for the proposed anesthesia with the patient or authorized representative who has indicated his/her understanding and acceptance.   Dental advisory given  Plan Discussed with: CRNA and Surgeon  Anesthesia Plan Comments:       Anesthesia Quick Evaluation

## 2017-10-16 NOTE — Progress Notes (Signed)
CRITICAL VALUE ALERT  Critical Value:  Lactic acid 2.5  Date & Time Notied:  10/16/2017 1307H  Provider Notified: Yes  Orders Received/Actions taken: Lactic acid trending down; lactic acid to be ordered again tomorrow.

## 2017-10-16 NOTE — Progress Notes (Addendum)
PHARMACY - ADULT TOTAL PARENTERAL NUTRITION CONSULT NOTE   Pharmacy Consult for TPN Indication: extensive large and small bowel resection  Patient Measurements: Height: _0  (162.6 cm) Weight: 183 lb 13.8 oz (83.4 kg) IBW/kg (Calculated) : 54.7 TPN AdjBW (KG): 60.8 Body mass index is 31.56 kg/m.  Insulin Requirements: Hx of DM.  Current Nutrition: NPO  IVF: LR at 125 ml/hr post-operatively > 90 ml/hr  Central access: CVC from 1/1 TPN start date: 1/3  ASSESSMENT                                                                                                          HPI: 84 yoF to ED 1/1 with abd pain, pneumatosis R colon > surgery. Hx of CAD, DM, Diverticulosis, HPL, HTN, chronic pancreatitis noted CVA  Significant events:  1/1 OR exp lap. R colectomy, wound vac 1/3 return to OR- expl lap > sm bowel ischemia, resected ischemic ileum > VAC replaced. Begin TPN   Today:   Glucose - serum glucose 119  Electrolytes - Phos elevated at 6.2, Mag 1.9, K 4.8  Renal - SCr elevated 2.14, but decreasing  LFTs - AST/ALT greatly elevated, Alk Phos & Bilirubin wnl  TGs - were 349 on 1/1, Propofol d/c 1/2  Prealbumin - pending  NUTRITIONAL GOALS                                                                                             RD recs: pending Renal and Hepatic dysfunction ongoing, cannot suggest goal rate at this time Would hold Lipids with Triglyceride > 400, last level 349 Clinimix / at a goal rate of ml/hr + 20% fat emulsion at ml/hr to provide: g/day protein, Kcal/day.  PLAN                                                                                                                         At 1800 today:  Start Clinimix 5/15 at 22m/hr, no electrolyte formula  20% fat emulsion at 268mhr x 12 hr  Plan to advance as tolerated to the goal rate.  TPN to contain standard multivitamins and trace elements.  Reduce IVF  to 44m/hr.  Add sensitive SSI q6h    TPN lab panels on Mondays & Thursdays.  F/u daily.  GMinda DittoPharmD Pager [phone]/12/2017, 1:42 PM

## 2017-10-16 NOTE — Addendum Note (Signed)
Addendum  created 10/16/17 0920 by Myrtie Soman, MD   Sign clinical note

## 2017-10-16 NOTE — Progress Notes (Signed)
PULMONARY / CRITICAL CARE MEDICINE   Name: [name] MRN: [id number] DOB: 1946-05-02 PCP Leanna Battles, MD LOS 2 as of 10/16/2017     ADMISSION DATE:  10/14/2017 CONSULTATION DATE:  10/16/2017   REFERRING MD:  Surgery  CHIEF COMPLAINT:  Post op respiratory failure  HISTORY OF PRESENT ILLNESS:   72 year old female with multiple medical problems.  Presented acutely with abdominal pain found to have pneumatosis of the right colon on CT scan.  Status post emergent laparotomy on the morning of October 14, 2017 with extended right colectomy and application of abdominal vacuum dressing by Dr. Autumn Messing and estimated blood loss of 100 cc postoperatively.  Patient was sent to the intensive care unit in the intubated state and critical care medicine was consulted for postoperative acute respiratory failure and medical management, RN reports pain, hyp0ertension, vent dysnchrony in ICU. ET tube is at carina  Subjective Now back from the OR, still pressor dependent   VITAL SIGNS: BP (!) 127/52   Pulse (!) 36   Temp 98.1 F (36.7 C)   Resp 18   Ht 5\' 4"  (1.626 m)   Wt 183 lb 13.8 oz (83.4 kg)   SpO2 96%   BMI 31.56 kg/m   HEMODYNAMICS:    VENTILATOR SETTINGS: Vent Mode: PRVC FiO2 (%):  [40 %] 40 % Set Rate:  [18 bmp] 18 bmp Vt Set:  [440 mL] 440 mL PEEP:  [5 cmH20] 5 cmH20 Plateau Pressure:  [16 cmH20-23 cmH20] 16 cmH20  INTAKE / OUTPUT:  Intake/Output Summary (Last 24 hours) at 10/16/2017 1101 Last data filed at 10/16/2017 0900 Gross per 24 hour  Intake 6903.7 ml  Output 1685 ml  Net 5218.7 ml    EXAM General: 72 year old white female currently sedated on ventilator no distress HEENT: Normocephalic atraumatic, sclera are nonicteric Pulmonary: Clear to auscultation without accessory muscle use decreased bases Cardiac: Regular rate and rhythm without murmur rub or gallop Extremities/muscular skeletal: Warm, dry, toes mottled, strong pulses no edema Abdomen: Soft, wound VAC  intact hypoactive Neuro: Sedated on ventilator LABS  PULMONARY Recent Labs  Lab 10/14/17 0310 10/14/17 0330 10/14/17 0641  PHART  --  7.527* 7.389  PCO2ART  --  23.8* 36.9  PO2ART  --  205* 239.0*  HCO3  --  19.6* 22.3  TCO2 20*  --  23  O2SAT  --  98.7 100.0    CBC Recent Labs  Lab 10/15/17 0759 10/15/17 1839 10/16/17 0414  HGB 11.7* 11.4* 11.1*  HCT 37.0 34.4* 33.6*  WBC 13.3* 14.6* 20.6*  PLT 145* 130* 140*    COAGULATION Recent Labs  Lab 10/14/17 0329  INR 1.38    CARDIAC   Recent Labs  Lab 10/14/17 0329 10/14/17 1501 10/15/17 0300 10/15/17 0759 10/16/17 0414  TROPONINI 0.07* 0.06* 0.07* 0.06* 0.04*   No results for input(s): PROBNP in the last 168 hours.   CHEMISTRY Recent Labs  Lab 10/14/17 0329 10/14/17 0641 10/14/17 1501 10/14/17 2242 10/15/17 0300 10/15/17 0759 10/15/17 1839 10/16/17 0414  NA 135 137 136 135 134*  --  131*  --   K 3.3* 4.6 3.3* 3.8 3.6  --  3.9  --   CL 98*  --  105 103 103  --  101  --   CO2 21*  --  22 21* 21*  --  21*  --   GLUCOSE 186*  --  112* 98 136*  --  112*  --   BUN 39*  --  43* 45* 47*  --  46*  --   CREATININE 1.37*  --  1.43* 2.54* 2.72*  --  2.22*  --   CALCIUM 9.1  --  7.4* 7.5* 7.5*  --  7.0*  --   MG  --   --  1.2*  --   --  2.4  --  1.9  PHOS  --   --  2.9  --   --  6.6*  --  6.2*   Estimated Creatinine Clearance: 24.6 mL/min (A) (by C-G formula based on SCr of 2.22 mg/dL (H)).   LIVER Recent Labs  Lab 10/14/17 0329 10/15/17 0759 10/15/17 1839 10/16/17 0414  AST 41 1,586* 1,016* 898*  ALT 23 828* 708* 683*  ALKPHOS 125 98 99 115  BILITOT 1.1 0.5 0.9 0.9  PROT 6.9 4.2* 3.8* 3.8*  ALBUMIN 2.8* 1.6* 1.4* 1.4*  INR 1.38  --   --   --      INFECTIOUS Recent Labs  Lab 10/15/17 0300 10/15/17 0759 10/15/17 1839  LATICACIDVEN 4.4* 3.9* 4.0*     ENDOCRINE CBG (last 3)  No results for input(s): GLUCAP in the last 72 hours.    IMAGING x48h  - image(s) personally visualized  -    highlighted in bold Dg Chest Port 1 View  Result Date: 10/15/2017 CLINICAL DATA:  Endotracheal tube position EXAM: PORTABLE CHEST 1 VIEW COMPARISON:  10/14/2017 FINDINGS: Endotracheal tube in good position. Right jugular central venous catheter tip in the lower SVC unchanged. NG coiled in the stomach Progression of bibasilar airspace disease most likely atelectasis. Progression of small bilateral effusions. Increased vascular congestion likely due IMPRESSION: Endotracheal tube in good position Progression of bibasilar airspace disease and bilateral effusions. Probable fluid overload. Electronically Signed   By: Franchot Gallo M.D.   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  -still pressor dependent  Plan NPO;  CVP goal 8-12 MAP goal > 65 Cont LR; decrease to 125 ml/hr (may need to re-address this depending on chemistry)  1Wound vac per surg Day # 4 zosyn  Add Eraxis for fungal coverage  Will likely need TNA (will d/w surg) Plan for re-look in OR on 1/5  Acute respiratory failure with hypoxia ([place]) Plan Cont full vent support  PAD protocol RAS -2 F/u CXR and ABG in am Weaning after all OR trips completed  AKI c/b lactic acidosis (may be exacerbated by on-going gut ischemia) -d/t shock w/ concern for abd compartment syndrome raised but unlikely as abd was open.  -last LA on 1/2 was increased but pressor requirements:  -sCr as of 1/2 was climbing; UOP:  Plan Repeat LA MAP >65 Renal dose meds Strict I&O F/u chem today and in am  Shock liver -lfts improved.  Plan F/u in am   Anemia of  critical illness and mild thrombocytopenia  Ebl:  Plan  SCDs Transfuse per protocol  Am cbc   Summary: Remains in septic shock in the setting of acute peritonitis from ischemic bowel.  Now status post second OR trip requiring further bowel resection.  Spoke with surgical team,  still concerned about integrity of small intestine.  She remains in discontinuity.  Goal for today's to ensure adequate end-organ perfusion maximize volume status, adding antifungals and TNA.  No ventilator weaning as plan to go back to the operating room in 48 hours for re-look and possible anastomosis    DVT prophylaxis: scds SUP: PPI  Diet: npo Activity: br Disposition : icu  MY CCT [place] ACNP-BC [place] Pager # [phone] OR # [phone] if no answer

## 2017-10-17 ENCOUNTER — Inpatient Hospital Stay (HOSPITAL_COMMUNITY): Payer: Medicare Other

## 2017-10-17 LAB — COMPREHENSIVE METABOLIC PANEL
ALT: 480 U/L — ABNORMAL HIGH (ref 14–54)
ANION GAP: 7 (ref 5–15)
AST: 604 U/L — ABNORMAL HIGH (ref 15–41)
Albumin: 1.3 g/dL — ABNORMAL LOW (ref 3.5–5.0)
Alkaline Phosphatase: 128 U/L — ABNORMAL HIGH (ref 38–126)
BILIRUBIN TOTAL: 1.1 mg/dL (ref 0.3–1.2)
BUN: 45 mg/dL — ABNORMAL HIGH (ref 6–20)
CHLORIDE: 100 mmol/L — AB (ref 101–111)
CO2: 22 mmol/L (ref 22–32)
Calcium: 6.7 mg/dL — ABNORMAL LOW (ref 8.9–10.3)
Creatinine, Ser: 2.29 mg/dL — ABNORMAL HIGH (ref 0.44–1.00)
GFR, EST AFRICAN AMERICAN: 24 mL/min — AB (ref >60)
GFR, EST NON AFRICAN AMERICAN: 20 mL/min — AB (ref >60)
Glucose, Bld: 144 mg/dL — ABNORMAL HIGH (ref 65–99)
Potassium: 3.9 mmol/L (ref 3.5–5.1)
Sodium: 129 mmol/L — ABNORMAL LOW (ref 135–145)
TOTAL PROTEIN: 3.7 g/dL — AB (ref 6.5–8.1)

## 2017-10-17 LAB — CBC WITH DIFFERENTIAL/PLATELET
BASOS PCT: 0 %
Basophils Absolute: 0 10*3/uL (ref 0.0–0.1)
Eosinophils Absolute: 0 10*3/uL (ref 0.0–0.7)
Eosinophils Relative: 0 %
HEMATOCRIT: 27.5 % — AB (ref 36.0–46.0)
HEMOGLOBIN: 9.2 g/dL — AB (ref 12.0–15.0)
Lymphocytes Relative: 5 %
Lymphs Abs: 1.2 10*3/uL (ref 0.7–4.0)
MCH: 28.7 pg (ref 26.0–34.0)
MCHC: 33.5 g/dL (ref 30.0–36.0)
MCV: 85.7 fL (ref 78.0–100.0)
MONOS PCT: 3 %
Monocytes Absolute: 0.7 10*3/uL (ref 0.1–1.0)
NEUTROS ABS: 21.9 10*3/uL — AB (ref 1.7–7.7)
NEUTROS PCT: 92 %
Platelets: 117 10*3/uL — ABNORMAL LOW (ref 150–400)
RBC: 3.21 MIL/uL — ABNORMAL LOW (ref 3.87–5.11)
RDW: 15.5 % (ref 11.5–15.5)
WBC: 23.8 10*3/uL — ABNORMAL HIGH (ref 4.0–10.5)

## 2017-10-17 LAB — GLUCOSE, CAPILLARY
GLUCOSE-CAPILLARY: 116 mg/dL — AB (ref 65–99)
GLUCOSE-CAPILLARY: 133 mg/dL — AB (ref 65–99)
Glucose-Capillary: 150 mg/dL — ABNORMAL HIGH (ref 65–99)
Glucose-Capillary: 129 mg/dL — ABNORMAL HIGH (ref 65–99)

## 2017-10-17 LAB — PHOSPHORUS: PHOSPHORUS: 5.5 mg/dL — AB (ref 2.5–4.6)

## 2017-10-17 LAB — LACTIC ACID, PLASMA: Lactic Acid, Venous: 1.2 mmol/L (ref 0.5–1.9)

## 2017-10-17 LAB — TRIGLYCERIDES
TRIGLYCERIDES: 366 mg/dL — AB (ref <150)
TRIGLYCERIDES: 261 mg/dL — AB (ref <150)

## 2017-10-17 LAB — BLOOD GAS, ARTERIAL
Acid-base deficit: 5.2 mmol/L — ABNORMAL HIGH (ref 0.0–2.0)
BICARBONATE: 19.7 mmol/L — AB (ref 20.0–28.0)
Drawn by: 308601
FIO2: 40
LHR: 18 {breaths}/min
O2 SAT: 96.5 %
PATIENT TEMPERATURE: 36.9
PCO2 ART: 38.3 mmHg (ref 32.0–48.0)
PEEP: 5 cmH2O
PH ART: 7.331 — AB (ref 7.350–7.450)
PO2 ART: 104 mmHg (ref 83.0–108.0)
VT: 440 mL

## 2017-10-17 LAB — PREALBUMIN

## 2017-10-17 LAB — MAGNESIUM: Magnesium: 1.9 mg/dL (ref 1.7–2.4)

## 2017-10-17 MED ORDER — CHLORHEXIDINE GLUCONATE CLOTH 2 % EX PADS: 6.0000 | MEDICATED_PAD | Freq: Once | CUTANEOUS | Status: AC

## 2017-10-17 MED ORDER — PROMETHAZINE HCL 25 MG/ML IJ SOLN: 6.2500 mg | INTRAMUSCULAR | Status: DC | PRN

## 2017-10-17 MED ORDER — TRACE MINERALS CR-CU-MN-SE-ZN 10-1000-500-60 MCG/ML IV SOLN
INTRAVENOUS | Status: AC
  Administered 2017-10-17: 17:00:00 via INTRAVENOUS
  Filled 2017-10-17 (×2): qty 960

## 2017-10-17 MED ORDER — HYDROMORPHONE HCL 1 MG/ML IJ SOLN: 0.2500 mg | INTRAMUSCULAR | Status: DC | PRN

## 2017-10-17 NOTE — Progress Notes (Signed)
General Surgery [facility]  Assessment & Plan:  POD#1/3 - status post ex lap with right colectomy for ischemia, small bowel resection for infarction             Sedated on vent in ICU, per CCM             VAC dressing in place, intact             IV abx's, TNA - per pharmacy             Improving hemodynamics - off pressors since early this AM             Anticipate return to OR 1/5 for removal of VAC, possible anastomosis and closure of abdomen          Joanna Regal, MD, [facility]       Office: [phone]    Chief Complaint: Intestinal ischemia, infarction  Subjective: Patient in ICU, on vent, sedated.  Nurse at bedside.  Objective: Vital signs in last 24 hours: Temp:  [97.3 F (36.3 C)-99 F (37.2 C)] 99 F (37.2 C) (01/04 0800) Pulse Rate:  [31-111] 55 (01/04 0800) Resp:  [18-26] 25 (01/04 0800) BP: (95-156)/(22-76) 146/76 (01/04 0800) SpO2:  [91 %-100 %] 98 % (01/04 0800) FiO2 (%):  [40 %] 40 % (01/04 0700) Weight:  [91.2 kg (201 lb 1 oz)] 91.2 kg (201 lb 1 oz) (01/03 1400) Last BM Date: (PTA)  Intake/Output from previous day: 01/03 0701 - 01/04 0700 In: 3159.3 [I.V.:2759.3; IV Piggyback:400] Out: 1220 [Urine:550; Emesis/NG output:145; Drains:425; Blood:100] Intake/Output this shift: No intake/output data recorded.  Physical Exam: HEENT - sclerae clear, mucous membranes moist Neck - soft Abdomen - soft, obese; VAC in midline wound intact with good seal, serous drainage in cannister   Lab Results:  Recent Labs    10/16/17 0414 10/17/17 0355  WBC 20.6* 23.8*  HGB 11.1* 9.2*  HCT 33.6* 27.5*  PLT 140* 117*   BMET Recent Labs    10/16/17 1158 10/17/17 0355  NA 130* 129*  K 4.8 3.9  CL 100* 100*  CO2 22 22  GLUCOSE 119* 144*  BUN 43* 45*  CREATININE 2.14* 2.29*  CALCIUM 6.8* 6.7*   PT/INR No results for input(s): LABPROT, INR in the last 72 hours. Comprehensive Metabolic Panel:     Component Value Date/Time   NA 129 (L) 10/17/2017 0355   NA 130 (L) 10/16/2017 1158   K 3.9 10/17/2017 0355   K 4.8 10/16/2017 1158   CL 100 (L) 10/17/2017 0355   CL 100 (L) 10/16/2017 1158   CO2 22 10/17/2017 0355   CO2 22 10/16/2017 1158   BUN 45 (H) 10/17/2017 0355   BUN 43 (H) 10/16/2017 1158   CREATININE 2.29 (H) 10/17/2017 0355   CREATININE 2.14 (H) 10/16/2017 1158   GLUCOSE 144 (H) 10/17/2017 0355   GLUCOSE 119 (H) 10/16/2017 1158   CALCIUM 6.7 (L) 10/17/2017 0355   CALCIUM 6.8 (L) 10/16/2017 1158   AST 604 (H) 10/17/2017 0355   AST 898 (H) 10/16/2017 0414   ALT 480 (H) 10/17/2017 0355   ALT 683 (H) 10/16/2017 0414   ALKPHOS 128 (H) 10/17/2017 0355   ALKPHOS 115 10/16/2017 0414   BILITOT 1.1 10/17/2017 0355   BILITOT 0.9 10/16/2017 0414   PROT 3.7 (L) 10/17/2017 0355   PROT 3.8 (L) 10/16/2017 0414   ALBUMIN 1.3 (L) 10/17/2017 0355  ALBUMIN 1.4 (L) 10/16/2017 0414    Studies/Results: Dg Chest Port 1 View  Result Date: 10/17/2017 CLINICAL DATA:  Acute respiratory failure. EXAM: PORTABLE CHEST 1 VIEW COMPARISON:  Radiograph of October 15, 2017. FINDINGS: Stable cardiomediastinal silhouette. Endotracheal and nasogastric tubes are unchanged in position. Right internal jugular catheter is unchanged in position. No pneumothorax is noted. Stable bibasilar infiltrates or atelectasis is noted with associated pleural effusions. Bony thorax is unremarkable. IMPRESSION: Stable support apparatus. Stable bibasilar opacities as described above. Electronically Signed   By: Marijo Conception, M.D.   On: 10/17/2017 07:12      [name] M 10/17/2017  Patient ID: [name], female   DOB: 11/23/1945, 72 y.o.   MRN: [id number]

## 2017-10-17 NOTE — Progress Notes (Signed)
Nutrition Follow-up  DOCUMENTATION CODES:   Obesity unspecified  INTERVENTION:    TPN per pharmacy  Monitor for diet advancement/toleration  Monitor magnesium, potassium, and phosphorus daily for at least 3 days, MD to replete as needed.   NUTRITION DIAGNOSIS:   Inadequate oral intake related to inability to eat as evidenced by NPO status.  Ongoing  GOAL:   Provide needs based on ASPEN/SCCM guidelines  Not meeting- TPN initiated  MONITOR:   Vent status, Weight trends, I & O's, Labs  REASON FOR ASSESSMENT:   Ventilator    ASSESSMENT:   Pt with PMH of GERD, DM, HTN, PVD, HLD, HTN presents with abdominal pain found necrosis of proximal colon. Pt is s/p emergent ex lap with extended right colectomy and application of abdominal vacuum dressing on 10/14/17. Pt required intubation r/t post op respiratory failure.    SIGNIFICANT EVENTS: 1/1- exploratory laparotomy, right colectomy with wound vac placement 1/3- small bowel ischemia, resected ischemic ileum, wound vac replaced, TPN intitated  RD consulted for TPN recommendations. Pt remains NPO at this time. Propofol discontinued 10/15/16. Needs adjusted according to ASPEN guidelines. Pt currently receiving Clinimix 5/15 @ 40 ml/hr- provides 682 kcal and 48 grams protein. Hold lipids for first 7 days of ICU visit.   RD TPN recommendation: Clinimix 5/15 @ goal rate of 85 ml/hr to provide 1448 calories and 96 g protein. This meets 113% of calorie needs and 94% protein needs. Difficult to meet protein and calorie needs with standard formula. Will readjust as needed.   Patient is currently intubated on ventilator support MV: 9.4 L/min Temp (24hrs), Avg:97.7 F (36.5 C), Min:97.3 F (36.3 C), Max:99 F (37.2 C) BP: 120/42 MAP: 69 Propofol: None  Medications reviewed and include: SSI, fentanyl, LR @ 90 ml/hr, levophed, IV abx Labs reviewed: Na 129 (L) BUN 45 (H) Creatinine 2.29 (H) Phosphorus 5.5 (H) AST 604 (H) ALT 480  (H)  Diet Order:  TPN (CLINIMIX) Adult without lytes TPN (CLINIMIX) Adult without lytes  EDUCATION NEEDS:   No education needs have been identified at this time  Skin:  Skin Assessment: Skin Integrity Issues: Skin Integrity Issues:: Incisions Incisions: abdomen  Last BM:  PTA  Height:   Ht Readings from Last 1 Encounters:  10/14/17 5\' 4"  (1.626 m)    Weight:   Wt Readings from Last 1 Encounters:  10/16/17 201 lb 1 oz (91.2 kg)    Ideal Body Weight:  54.5 kg  BMI:  Body mass index is 34.51 kg/m.  Estimated Nutritional Needs:   Kcal:  1003-1276 kcal (11-14 kcal/kg)  Protein:  109-119 grams (2-2.2 g/kg)  Fluid:  >1 L/day    Mariana Single RD, LDN Clinical Nutrition Pager # - [phone]

## 2017-10-17 NOTE — Progress Notes (Signed)
PHARMACY - ADULT TOTAL PARENTERAL NUTRITION CONSULT NOTE   Pharmacy Consult for TPN Indication: extensive large and small bowel resection  Patient Measurements: Height: '5\' 4"'  (162.6 cm) Weight: 201 lb 1 oz (91.2 kg) IBW/kg (Calculated) : 54.7 TPN AdjBW (KG): 60.8 Body mass index is 34.51 kg/m.  Insulin Requirements: Hx of DM. 2 Units SSI/24 hrs, CBG after TPN started at 30 ml/hr 142 & 150  Current Nutrition: NPO  IVF: LR at 125 ml/hr post-operatively > 90 ml/hr 1/3  Central access: CVC from 1/1 TPN start date: 1/3  ASSESSMENT                                                                                                          HPI: 34 yoF to ED 1/1 with abd pain, pneumatosis R colon > surgery. Hx of CAD, DM, Diverticulosis, HPL, HTN, chronic pancreatitis noted CVA  Significant events:  1/1 OR exp lap. R colectomy, wound vac 1/3 return to OR- expl lap > sm bowel ischemia, resected ischemic ileum > VAC replaced. Begin TPN   Today:   Glucose - DM on Victoza PTA, CBGS <= 150 after TPN started at 30 mlhr  Electrolytes -none in TPN;  Na low at 129, K 3.9,  Phos elevated at 5.5, Mag 1.9, CoCa 8.86  Renal - SCr elevated 2.29  LFTs - AST/ALT  Elevated but coming down, Alk Phos SL elevated. Bilirubin wnl  TGs - were 349 on 1/1, Propofol d/c 1/2, Trig 366 1/4  Prealbumin - < 5  2nd critical illness/inflammation  NUTRITIONAL GOALS                                                                                             RD recs: pending Hold 20% lipid emulsion for first 7 days for ICU patients per ASPEN guidelines (Start date 10/17/17)  Clinimix / at a goal rate of ml/hr + 20% fat emulsion at ml/hr to provide: g/day protein, Kcal/day.  PLAN                                                                                                                         At 1800  today:  Increase Clinimix 5/15 to 43m/hr, no electrolyte formula. This provides ~ 682 Kcal and 48 gm  protein Hold 20% lipid emulsion for first 7 days for ICU patients per ASPEN guidelines (Start date 10/17/17)  Plan to advance as tolerated to the goal rate.  TPN to contain standard multivitamins and trace elements.  IVF at 968mhr.  Continue sensitive SSI q6h   TPN lab panels on Mondays & Thursdays.  BMET, mag and phos in am  F/u RD for goals  F/u daily.  MiEudelia BunchPharm.D. [id number]/01/2018 9:17 AM

## 2017-10-17 NOTE — Plan of Care (Signed)
  Progressing Clinical Measurements: Ability to maintain clinical measurements within normal limits will improve 10/17/2017 1123 - Progressing by Staci Righter, RN Will remain free from infection 10/17/2017 1123 - Progressing by Staci Righter, RN Diagnostic test results will improve 10/17/2017 1123 - Progressing by Staci Righter, RN Respiratory complications will improve 10/17/2017 1123 - Progressing by Staci Righter, RN Cardiovascular complication will be avoided 10/17/2017 1123 - Progressing by Staci Righter, RN

## 2017-10-17 NOTE — Progress Notes (Signed)
PULMONARY / CRITICAL CARE MEDICINE   Name: [name] MRN: [id number] DOB: September 25, 1946 PCP Leanna Battles, MD LOS 3 as of 10/17/2017     ADMISSION DATE:  10/14/2017 CONSULTATION DATE:  10/17/2017   REFERRING MD:  Surgery  CHIEF COMPLAINT:  Post op respiratory failure  HISTORY OF PRESENT ILLNESS:   72 year old female with multiple medical problems.  Presented acutely with abdominal pain found to have pneumatosis of the right colon on CT scan.  Status post emergent laparotomy on the morning of October 14, 2017 with extended right colectomy and application of abdominal vacuum dressing by Dr. Autumn Messing and estimated blood loss of 100 cc postoperatively.  Patient was sent to the intensive care unit in the intubated state and critical care medicine was consulted for postoperative acute respiratory failure and medical management, RN reports pain, hyp0ertension, vent dysnchrony in ICU. ET tube is at carina  Subjective Off pressors   VITAL SIGNS: BP (!) 153/59   Pulse (!) 52   Temp 99.1 F (37.3 C)   Resp (!) 27   Ht 5\' 4"  (1.626 m)   Wt 201 lb 1 oz (91.2 kg)   SpO2 95%   BMI 34.51 kg/m   HEMODYNAMICS: CVP:  [5 mmHg-10 mmHg] 8 mmHg  VENTILATOR SETTINGS: Vent Mode: PRVC FiO2 (%):  [30 %-40 %] 30 % Set Rate:  [18 bmp] 18 bmp Vt Set:  [440 mL] 440 mL PEEP:  [5 cmH20] 5 cmH20 Plateau Pressure:  [10 cmH20-17 cmH20] 15 cmH20  INTAKE / OUTPUT:  Intake/Output Summary (Last 24 hours) at 10/17/2017 1217 Last data filed at 10/17/2017 1100 Gross per 24 hour  Intake 3472.44 ml  Output 1270 ml  Net 2202.44 ml    EXAM General: Sedated on ventilator now off pressors HEENT: Orally intubated right IJ triple-lumen catheter unremarkable next membranes moist sclera nonicteric Pulmonary: Clear to auscultation decreased bases equal chest rise on ventilator Cardiac: Regular rate and rhythm Abdomen: Wound VAC intact soft, no tenderness, Extremities/musculoskeletal: Equal strength and bulk,  diffuse anasarca worsening compared to yesterday's exam Neuro/psych: Sedated on ventilator  PULMONARY Recent Labs  Lab 10/14/17 0310 10/14/17 0330 10/14/17 0641 10/16/17 1230 10/17/17 0335  PHART  --  7.527* 7.389 7.213* 7.331*  PCO2ART  --  23.8* 36.9 52.5* 38.3  PO2ART  --  205* 239.0* 83.0 104  HCO3  --  19.6* 22.3 20.3 19.7*  TCO2 20*  --  23  --   --   O2SAT  --  98.7 100.0 92.8 96.5    CBC Recent Labs  Lab 10/15/17 1839 10/16/17 0414 10/17/17 0355  HGB 11.4* 11.1* 9.2*  HCT 34.4* 33.6* 27.5*  WBC 14.6* 20.6* 23.8*  PLT 130* 140* 117*    COAGULATION Recent Labs  Lab 10/14/17 0329  INR 1.38    CARDIAC   Recent Labs  Lab 10/14/17 0329 10/14/17 1501 10/15/17 0300 10/15/17 0759 10/16/17 0414  TROPONINI 0.07* 0.06* 0.07* 0.06* 0.04*   No results for input(s): PROBNP in the last 168 hours.   CHEMISTRY Recent Labs  Lab 10/14/17 1501 10/14/17 2242 10/15/17 0300 10/15/17 0759 10/15/17 1839 10/16/17 0414 10/16/17 1158 10/17/17 0355  NA 136 135 134*  --  131*  --  130* 129*  K 3.3* 3.8 3.6  --  3.9  --  4.8 3.9  CL 105 103 103  --  101  --  100* 100*  CO2 22 21* 21*  --  21*  --  22 22  GLUCOSE 112*  98 136*  --  112*  --  119* 144*  BUN 43* 45* 47*  --  46*  --  43* 45*  CREATININE 1.43* 2.54* 2.72*  --  2.22*  --  2.14* 2.29*  CALCIUM 7.4* 7.5* 7.5*  --  7.0*  --  6.8* 6.7*  MG 1.2*  --   --  2.4  --  1.9  --  1.9  PHOS 2.9  --   --  6.6*  --  6.2*  --  5.5*   Estimated Creatinine Clearance: 25 mL/min (A) (by C-G formula based on SCr of 2.29 mg/dL (H)).   LIVER Recent Labs  Lab 10/14/17 0329 10/15/17 0759 10/15/17 1839 10/16/17 0414 10/17/17 0355  AST 41 1,586* 1,016* 898* 604*  ALT 23 828* 708* 683* 480*  ALKPHOS 125 98 99 115 128*  BILITOT 1.1 0.5 0.9 0.9 1.1  PROT 6.9 4.2* 3.8* 3.8* 3.7*  ALBUMIN 2.8* 1.6* 1.4* 1.4* 1.3*  INR 1.38  --   --   --   --      INFECTIOUS Recent Labs  Lab 10/15/17 1839 10/16/17 1158  10/17/17 0817  LATICACIDVEN 4.0* 2.5* 1.2     ENDOCRINE CBG (last 3)  Recent Labs    10/16/17 1734 10/16/17 2335 10/17/17 0547  GLUCAP 117* 142* 150*      IMAGING x48h  - image(s) personally visualized  -   highlighted in bold Dg Chest Port 1 View  Result Date: 10/17/2017 CLINICAL DATA:  Acute respiratory failure. EXAM: PORTABLE CHEST 1 VIEW COMPARISON:  Radiograph of October 15, 2017. FINDINGS: Stable cardiomediastinal silhouette. Endotracheal and nasogastric tubes are unchanged in position. Right internal jugular catheter is unchanged in position. No pneumothorax is noted. Stable bibasilar infiltrates or atelectasis is noted with associated pleural effusions. Bony thorax is unremarkable. IMPRESSION: Stable support apparatus. Stable bibasilar opacities as described above. Electronically Signed   By: Marijo Conception, M.D.   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  PCXR reviewed: ett good position. Increased bibasilar atx/effusions R>L  Plan Cont full vent support  PAD protocol RAS goal -2 to -3 Weaning AFTER all surgery completed  abg am cxr am Hold diuresis for now  AKI c/b lactic acidosis (may be exacerbated by on-going gut ischemia) -d/t shock w/ concern for abd compartment syndrome raised but unlikely as abd was open.  -last LA on 1/2 was increased but pressor requirements:  -sCr  hopefully hitting plateau   Plan MAP > 65 Renal dose meds Strict I&O May need renal consult if cont to worsen   Shock liver -lfts improved.  Plan F/u am   Anemia of critical illness and mild thrombocytopenia  Ebl:  Plan  Cont scds [place] heparin per surgical team (when ok w/ them) Transfuse per protocol    Summary: Remains in septic shock in the  setting of acute peritonitis from ischemic bowel.  Now status post second OR trip requiring further bowel resection.  Spoke with surgical team, still concerned about integrity of small intestine.  She remains in discontinuity.  Cont supportive care. Off pressors. Back to OR 1/5    DVT prophylaxis: scds SUP: PPI  Diet: npo Activity: br Disposition : icu  MY CCT [place] ACNP-BC [employer] Pager # [phone] OR # [phone] if no answer

## 2017-10-18 ENCOUNTER — Encounter (HOSPITAL_COMMUNITY): Payer: Self-pay | Admitting: Anesthesiology

## 2017-10-18 ENCOUNTER — Inpatient Hospital Stay (HOSPITAL_COMMUNITY): Payer: Medicare Other

## 2017-10-18 ENCOUNTER — Encounter (HOSPITAL_COMMUNITY)
Admission: EM | Disposition: A | Discharge status: Nursing Home (Includes ICF) | Payer: Self-pay | Source: Home / Self Care

## 2017-10-18 ENCOUNTER — Inpatient Hospital Stay (HOSPITAL_COMMUNITY): Payer: Medicare Other | Admitting: Certified Registered Nurse Anesthetist

## 2017-10-18 HISTORY — PX: LAPAROTOMY: SHX154

## 2017-10-18 LAB — BASIC METABOLIC PANEL
ANION GAP: 5 (ref 5–15)
BUN: 51 mg/dL — ABNORMAL HIGH (ref 6–20)
CO2: 22 mmol/L (ref 22–32)
CREATININE: 2.51 mg/dL — AB (ref 0.44–1.00)
Calcium: 6.8 mg/dL — ABNORMAL LOW (ref 8.9–10.3)
Chloride: 103 mmol/L (ref 101–111)
GFR calc non Af Amer: 18 mL/min — ABNORMAL LOW (ref >60)
GFR, EST AFRICAN AMERICAN: 21 mL/min — AB (ref >60)
Glucose, Bld: 129 mg/dL — ABNORMAL HIGH (ref 65–99)
Potassium: 3.3 mmol/L — ABNORMAL LOW (ref 3.5–5.1)
SODIUM: 130 mmol/L — AB (ref 135–145)

## 2017-10-18 LAB — CBC WITH DIFFERENTIAL/PLATELET
BASOS ABS: 0 10*3/uL (ref 0.0–0.1)
Basophils Relative: 0 %
EOS ABS: 0 10*3/uL (ref 0.0–0.7)
Eosinophils Relative: 0 %
HEMATOCRIT: 27.9 % — AB (ref 36.0–46.0)
Hemoglobin: 9.4 g/dL — ABNORMAL LOW (ref 12.0–15.0)
LYMPHS PCT: 6 %
Lymphs Abs: 1.4 10*3/uL (ref 0.7–4.0)
MCH: 28.2 pg (ref 26.0–34.0)
MCHC: 33.7 g/dL (ref 30.0–36.0)
MCV: 83.8 fL (ref 78.0–100.0)
Monocytes Absolute: 0.9 10*3/uL (ref 0.1–1.0)
Monocytes Relative: 4 %
NEUTROS ABS: 20.5 10*3/uL — AB (ref 1.7–7.7)
NEUTROS PCT: 90 %
Platelets: 102 10*3/uL — ABNORMAL LOW (ref 150–400)
RBC: 3.33 MIL/uL — AB (ref 3.87–5.11)
RDW: 15.3 % (ref 11.5–15.5)
WBC: 22.8 10*3/uL — AB (ref 4.0–10.5)

## 2017-10-18 LAB — BLOOD GAS, ARTERIAL
ACID-BASE DEFICIT: 3.5 mmol/L — AB (ref 0.0–2.0)
BICARBONATE: 21.3 mmol/L (ref 20.0–28.0)
Drawn by: 514251
FIO2: 30
LHR: 18 {breaths}/min
O2 Saturation: 90.6 %
PATIENT TEMPERATURE: 99.7
PEEP/CPAP: 5 cmH2O
PO2 ART: 72.8 mmHg — AB (ref 83.0–108.0)
VT: 440 mL
pCO2 arterial: 41.2 mmHg (ref 32.0–48.0)
pH, Arterial: 7.337 — ABNORMAL LOW (ref 7.350–7.450)

## 2017-10-18 LAB — GLUCOSE, CAPILLARY
GLUCOSE-CAPILLARY: 117 mg/dL — AB (ref 65–99)
GLUCOSE-CAPILLARY: 132 mg/dL — AB (ref 65–99)
GLUCOSE-CAPILLARY: 140 mg/dL — AB (ref 65–99)
Glucose-Capillary: 134 mg/dL — ABNORMAL HIGH (ref 65–99)

## 2017-10-18 LAB — MAGNESIUM: MAGNESIUM: 1.8 mg/dL (ref 1.7–2.4)

## 2017-10-18 LAB — PHOSPHORUS: Phosphorus: 4.2 mg/dL (ref 2.5–4.6)

## 2017-10-18 SURGERY — LAPAROTOMY, EXPLORATORY
Anesthesia: General | Site: Abdomen

## 2017-10-18 MED ORDER — DEXTROSE 5 % IV SOLN
INTRAVENOUS | Status: DC | PRN
  Administered 2017-10-18: 80 ug/min via INTRAVENOUS

## 2017-10-18 MED ORDER — POTASSIUM CHLORIDE 10 MEQ/50ML IV SOLN
10.0000 meq | INTRAVENOUS | Status: AC
  Administered 2017-10-18 (×4): 10 meq via INTRAVENOUS
  Filled 2017-10-18 (×4): qty 50

## 2017-10-18 MED ORDER — ROCURONIUM BROMIDE 50 MG/5ML IV SOSY
PREFILLED_SYRINGE | INTRAVENOUS | Status: DC | PRN
  Administered 2017-10-18: 30 mg via INTRAVENOUS

## 2017-10-18 MED ORDER — MIDAZOLAM HCL 5 MG/5ML IJ SOLN
INTRAMUSCULAR | Status: DC | PRN
  Administered 2017-10-18 (×2): 1 mg via INTRAVENOUS

## 2017-10-18 MED ORDER — ONDANSETRON HCL 4 MG/2ML IJ SOLN
INTRAMUSCULAR | Status: AC
Start: 2017-10-18 — End: 2017-10-18
  Filled 2017-10-18: qty 2

## 2017-10-18 MED ORDER — ONDANSETRON HCL 4 MG/2ML IJ SOLN
INTRAMUSCULAR | Status: DC | PRN
  Administered 2017-10-18: 4 mg via INTRAVENOUS

## 2017-10-18 MED ORDER — TRACE MINERALS CR-CU-MN-SE-ZN 10-1000-500-60 MCG/ML IV SOLN
INTRAVENOUS | Status: AC
  Administered 2017-10-18: 19:00:00 via INTRAVENOUS
  Filled 2017-10-18: qty 1512

## 2017-10-18 MED ORDER — 0.9 % SODIUM CHLORIDE (POUR BTL) OPTIME
TOPICAL | Status: DC | PRN
  Administered 2017-10-18 (×2): 1000 mL

## 2017-10-18 MED ORDER — MAGNESIUM SULFATE 2 GM/50ML IV SOLN
2.0000 g | Freq: Once | INTRAVENOUS | Status: AC
  Administered 2017-10-18: 2 g via INTRAVENOUS
  Filled 2017-10-18: qty 50

## 2017-10-18 MED ORDER — MIDAZOLAM HCL 2 MG/2ML IJ SOLN
INTRAMUSCULAR | Status: AC
  Filled 2017-10-18: qty 2

## 2017-10-18 MED ORDER — LACTATED RINGERS IV SOLN
INTRAVENOUS | Status: DC
Start: 2017-10-18 — End: 2017-10-19
  Administered 2017-10-18: 10:00:00 via INTRAVENOUS

## 2017-10-18 SURGICAL SUPPLY — 51 items
APPLICATOR COTTON TIP 6IN STRL (MISCELLANEOUS) ×2 IMPLANT
BARRIER SKIN 2 3/4 (OSTOMY) ×2 IMPLANT
BARRIER SKIN OD2.25 2 3/4 FLNG (OSTOMY) IMPLANT
BLADE EXTENDED COATED 6.5IN (ELECTRODE) IMPLANT
BLADE HEX COATED 2.75 (ELECTRODE) ×2 IMPLANT
BLADE SURG SZ10 CARB STEEL (BLADE) IMPLANT
BNDG GAUZE ELAST 4 BULKY (GAUZE/BANDAGES/DRESSINGS) ×2 IMPLANT
BRR SKN FLT 2.75X2.25 2 PC (OSTOMY) ×1
COVER MAYO STAND STRL (DRAPES) ×1 IMPLANT
COVER SURGICAL LIGHT HANDLE (MISCELLANEOUS) ×2 IMPLANT
DRAIN CHANNEL 19F RND (DRAIN) IMPLANT
DRAPE LAPAROSCOPIC ABDOMINAL (DRAPES) ×2 IMPLANT
DRAPE SHEET LG 3/4 BI-LAMINATE (DRAPES) IMPLANT
DRAPE WARM FLUID 44X44 (DRAPE) ×1 IMPLANT
DRSG PAD ABDOMINAL 8X10 ST (GAUZE/BANDAGES/DRESSINGS) IMPLANT
ELECT REM PT RETURN 15FT ADLT (MISCELLANEOUS) ×2 IMPLANT
EVACUATOR DRAINAGE 10X20 100CC (DRAIN) IMPLANT
EVACUATOR SILICONE 100CC (DRAIN)
GAUZE SPONGE 4X4 12PLY STRL (GAUZE/BANDAGES/DRESSINGS) ×2 IMPLANT
GLOVE BIO SURGEON STRL SZ 6 (GLOVE) ×3 IMPLANT
GLOVE BIOGEL PI IND STRL 6.5 (GLOVE) ×1 IMPLANT
GLOVE BIOGEL PI IND STRL 7.0 (GLOVE) ×1 IMPLANT
GLOVE BIOGEL PI INDICATOR 6.5 (GLOVE) ×1
GLOVE BIOGEL PI INDICATOR 7.0 (GLOVE) ×1
GLOVE INDICATOR 6.5 STRL GRN (GLOVE) ×4 IMPLANT
GOWN STRL REUS W/ TWL XL LVL3 (GOWN DISPOSABLE) ×3 IMPLANT
GOWN STRL REUS W/TWL 2XL LVL3 (GOWN DISPOSABLE) ×2 IMPLANT
GOWN STRL REUS W/TWL XL LVL3 (GOWN DISPOSABLE) ×6
HANDLE SUCTION POOLE (INSTRUMENTS) ×1 IMPLANT
KIT BASIN OR (CUSTOM PROCEDURE TRAY) ×2 IMPLANT
LEGGING LITHOTOMY PAIR STRL (DRAPES) IMPLANT
NS IRRIG 1000ML POUR BTL (IV SOLUTION) ×4 IMPLANT
PACK GENERAL/GYN (CUSTOM PROCEDURE TRAY) ×2 IMPLANT
PAD ABD 8X10 STRL (GAUZE/BANDAGES/DRESSINGS) ×2 IMPLANT
POUCH OSTOMY 2 3/4  H 3804 (WOUND CARE) ×1
POUCH OSTOMY 2 3/4 H 3804 (WOUND CARE) ×1
POUCH OSTOMY 2 PC DRNBL 2.75 (WOUND CARE) ×1 IMPLANT
STAPLER VISISTAT 35W (STAPLE) ×2 IMPLANT
SUCTION POOLE HANDLE (INSTRUMENTS) ×2
SUT PDS AB 1 CTX 36 (SUTURE) IMPLANT
SUT PDS AB 1 TP1 96 (SUTURE) ×4 IMPLANT
SUT PROLENE 2 0 SH DA (SUTURE) ×1 IMPLANT
SUT SILK 3 0 (SUTURE) ×2
SUT SILK 3-0 18XBRD TIE 12 (SUTURE) IMPLANT
SUT VIC AB 2-0 SH 18 (SUTURE) ×1 IMPLANT
SUT VIC AB 3-0 SH 18 (SUTURE) ×1 IMPLANT
SUT VIC AB 3-0 SH 8-18 (SUTURE) ×2 IMPLANT
TAPE CLOTH SURG 4X10 WHT LF (GAUZE/BANDAGES/DRESSINGS) ×1 IMPLANT
TOWEL OR 17X26 10 PK STRL BLUE (TOWEL DISPOSABLE) ×2 IMPLANT
TRAY FOLEY W/METER SILVER 16FR (SET/KITS/TRAYS/PACK) ×1 IMPLANT
YANKAUER SUCT BULB TIP NO VENT (SUCTIONS) ×1 IMPLANT

## 2017-10-18 NOTE — Progress Notes (Signed)
[facility] NOTE   Pharmacy Consult for TPN Indication: extensive large and small bowel resection  Patient Measurements: Height: _0  (162.6 cm) Weight: 201 lb 1 oz (91.2 kg) IBW/kg (Calculated) : 54.7 TPN AdjBW (KG): 60.8 Body mass index is 34.51 kg/m.  Insulin Requirements: Hx of DM. 4 Units SSI/24 hrs,   Current Nutrition: NPO  IVF: LR at 125 ml/hr post-operatively > 90 ml/hr 1/3  Central access: CVC from 1/1 TPN start date: 1/3  ASSESSMENT                                                                                                          HPI: 81 yoF to ED 1/1 with abd pain, pneumatosis R colon > surgery. Hx of CAD, DM, Diverticulosis, HPL, HTN, chronic pancreatitis noted CVA  Significant events:  1/1 OR exp lap. R colectomy, wound vac 1/3 return to OR- expl lap > sm bowel ischemia, resected ischemic ileum > VAC replaced. Begin TPN   Today:   Glucose - DM on Victoza PTA, CBGS <= 150 on TPN at 40 mlhr  Electrolytes -none in TPN;  Na low at 130, K low at 3.3,  Phos 4.2, Mag 1.8, CoCa 8.96  Renal - SCr elevated 2.51 I/O  + 745, NG 350 ml/24, drains 450 ml/24 hrs  LFTs - AST/ALT  Elevated but coming down, Alk Phos SL elevated. Bilirubin wnl  TGs - were 349 on 1/1, Propofol d/c 1/2, Trig 366 1/4  Prealbumin - < 5  2nd critical illness/inflammation  NUTRITIONAL GOALS                                                                                             RD recs: 10/17/17 Kcal 3329-5188 , protein 109-119 gm  Hold 20% lipid emulsion for first 7 days for ICU patients per ASPEN guidelines (Start date 10/17/17)  Clinimix 5/15 at a goal rate of 85 ml/hr to provide:1448 kcal and 96 gm protein.  113% kcal and 94 % protein.  g/day protein, Kcal/day.  PLAN                                                                                   Now: 4 runs K  At 1800 today:  Add standard electrolytes  to TPN and Increase Clinimix E 5/15 to 63 ml/hr This provides  Kcal and  gm protein Hold 20% lipid emulsion for first 7 days for ICU patients per ASPEN guidelines (Start date 10/17/17)  advance as tolerated to the goal rate of 85 ml/hr.  TPN to contain standard multivitamins and trace elements.  LR at 50 ml/hr- decr to 20 ml/hr or per MD  Continue sensitive SSI q6h   TPN lab panels on Mondays & Thursdays.  BMET, mag and phos in am  F/u daily.  Eudelia Bunch, Pharm.D. [phone] 10/18/2017 6:54 AM

## 2017-10-18 NOTE — Progress Notes (Signed)
Vent check not done fort this am due to pt in surgery.

## 2017-10-18 NOTE — Progress Notes (Addendum)
General Surgery [facility]  Assessment & Plan:  POD#2/4 - status post ex lap with right colectomy for ischemia, small bowel resection for infarction             Sedated on vent in ICU, per CCM             VAC dressing in place, intact             IV abx's, TNA - per pharmacy             Improving hemodynamics - remains off pressors             Return to OR 1/5 for removal of VAC, possible anastomosis and closure of abdomen  Discussed OR plan with son.  Reviewed possibility of additional bowel resection and possible ostomy.  Also discussed that decision to close would be made in the OR.          Stark Klein, MD, [facility]       Office: [phone]    Chief Complaint: Intestinal ischemia, infarction  Subjective: No acute events.    Objective: Vital signs in last 24 hours: Temp:  [99 F (37.2 C)-99.9 F (37.7 C)] 99.7 F (37.6 C) (01/05 0600) Pulse Rate:  [40-113] 102 (01/05 0600) Resp:  [17-32] 18 (01/05 0600) BP: (106-159)/(34-85) 126/52 (01/05 0600) SpO2:  [95 %-100 %] 98 % (01/05 0600) FiO2 (%):  [30 %-40 %] 30 % (01/05 0600) Last BM Date: (PTA)  Intake/Output from previous day: 01/04 0701 - 01/05 0700 In: 2652.9 [I.V.:2372.9; IV Piggyback:280] Out: 2035 [Urine:1235; Emesis/NG output:350; Drains:450] Intake/Output this shift: No intake/output data recorded.  Physical Exam: HEENT - sclerae clear, mucous membranes moist Sedated, ventilated. Neck - soft Abdomen - soft, obese; VAC in midline wound intact with good seal, serous drainage in cannister   Lab Results:  Recent Labs    10/17/17 0355 10/18/17 0500  WBC 23.8* 22.8*  HGB 9.2* 9.4*  HCT 27.5* 27.9*  PLT 117* 102*   BMET Recent Labs    10/17/17 0355 10/18/17 0500  NA 129* 130*  K 3.9 3.3*  CL 100* 103  CO2 22 22  GLUCOSE 144* 129*  BUN 45* 51*  CREATININE 2.29* 2.51*  CALCIUM 6.7* 6.8*   PT/INR No results for input(s): LABPROT, INR  in the last 72 hours. Comprehensive Metabolic Panel:    Component Value Date/Time   NA 130 (L) 10/18/2017 0500   NA 129 (L) 10/17/2017 0355   K 3.3 (L) 10/18/2017 0500   K 3.9 10/17/2017 0355   CL 103 10/18/2017 0500   CL 100 (L) 10/17/2017 0355   CO2 22 10/18/2017 0500   CO2 22 10/17/2017 0355   BUN 51 (H) 10/18/2017 0500   BUN 45 (H) 10/17/2017 0355   CREATININE 2.51 (H) 10/18/2017 0500   CREATININE 2.29 (H) 10/17/2017 0355   GLUCOSE 129 (H) 10/18/2017 0500   GLUCOSE 144 (H) 10/17/2017 0355   CALCIUM 6.8 (L) 10/18/2017 0500   CALCIUM 6.7 (L) 10/17/2017 0355   AST 604 (H) 10/17/2017 0355   AST 898 (H) 10/16/2017 0414   ALT 480 (H) 10/17/2017 0355   ALT 683 (H) 10/16/2017 0414   ALKPHOS 128 (H) 10/17/2017 0355   ALKPHOS 115 10/16/2017 0414   BILITOT 1.1 10/17/2017 0355   BILITOT 0.9 10/16/2017 0414   PROT 3.7 (L) 10/17/2017 0355   PROT 3.8 (L) 10/16/2017 0414   ALBUMIN  1.3 (L) 10/17/2017 0355   ALBUMIN 1.4 (L) 10/16/2017 0414    Studies/Results: Dg Chest Port 1 View  Result Date: 10/18/2017 CLINICAL DATA:  Patient with acute respiratory failure. EXAM: PORTABLE CHEST 1 VIEW COMPARISON:  History graft 10/17/2017. FINDINGS: Patient is rotated to the right. ET tube terminates in the mid trachea. Right IJ central venous catheter tip projects over the superior vena cava. Enteric tube courses inferior to the diaphragm. Stable cardiac and mediastinal contours. Monitoring leads overlie the patient. Persistent small layering bilateral pleural effusions and underlying opacities. No pneumothorax. IMPRESSION: Grossly unchanged layering effusions and underlying opacities favored represent atelectasis. Stable support apparatus. Electronically Signed   By: Lovey Newcomer M.D.   On: 10/18/2017 07:21   Dg Chest Port 1 View  Result Date: 10/17/2017 CLINICAL DATA:  Acute respiratory failure. EXAM: PORTABLE CHEST 1 VIEW COMPARISON:  Radiograph of October 15, 2017. FINDINGS: Stable cardiomediastinal  silhouette. Endotracheal and nasogastric tubes are unchanged in position. Right internal jugular catheter is unchanged in position. No pneumothorax is noted. Stable bibasilar infiltrates or atelectasis is noted with associated pleural effusions. Bony thorax is unremarkable. IMPRESSION: Stable support apparatus. Stable bibasilar opacities as described above. Electronically Signed   By: Marijo Conception, M.D.   On: 10/17/2017 07:12      Stark Klein 10/18/2017

## 2017-10-18 NOTE — Progress Notes (Signed)
PULMONARY / CRITICAL CARE MEDICINE   Name: [name] MRN: [id number] DOB: [birth year] PCP Leanna Battles, MD LOS 4 as of 10/18/2017     ADMISSION DATE:  10/14/2017 CONSULTATION DATE:  10/18/2017   REFERRING MD:  Surgery  CHIEF COMPLAINT:  Post op respiratory failure  brief 72 year old female with multiple medical problems.  Presented acutely with abdominal pain found to have pneumatosis of the right colon on CT scan.  Status post emergent laparotomy on the morning of October 14, 2017 with extended right colectomy and application of abdominal vacuum dressing by Dr. Autumn Messing and estimated blood loss of 100 cc postoperatively.  Patient was sent to the intensive care unit in the intubated state and critical care medicine was consulted for postoperative acute respiratory failure and medical management, RN reports pain, hyp0ertension, vent dysnchrony in ICU. ET tube is at [facility]  EVENTS 10/14/2017 -admit 10/17/17 - Off pressors    SUBJECTIVE/OVERNIGHT/INTERVAL HX 10/18/17 - back from OR trip #3 - sp partial wound closure and ileostomy. Not on pressors. On fent gtt. Getting K. Making some urine  VITAL SIGNS: BP (!) 125/43   Pulse (!) 48   Temp 99.5 F (37.5 C)   Resp 18   Ht 5\' 4"  (1.626 m)   Wt 91.2 kg (201 lb 1 oz)   SpO2 98%   BMI 34.51 kg/m   HEMODYNAMICS: CVP:  [4 mmHg-13 mmHg] 12 mmHg  VENTILATOR SETTINGS: Vent Mode: PRVC FiO2 (%):  [30 %] 30 % Set Rate:  [18 bmp] 18 bmp Vt Set:  [440 mL] 440 mL PEEP:  [5 cmH20] 5 cmH20 Plateau Pressure:  [20 [id number] cmH20] 20 cmH20  INTAKE / OUTPUT:  Intake/Output Summary (Last 24 hours) at 10/18/2017 1107 Last data filed at 10/18/2017 1029 Gross per 24 hour  Intake 3187.21 ml  Output 2260 ml  Net 927.21 ml    EXAM  General Appearance:    Looks criticall ill OBESE - yes  Head:    Normocephalic, without obvious abnormality, atraumatic  Eyes:    PERRL - yes, conjunctiva/corneas - clear      Ears:    Normal external ear canals,  both ears  Nose:   NG tube - no  Throat:  ETT TUBE - yes , OG tube - yes  Neck:   Supple,  No enlargement/tenderness/nodules     Lungs:     Clear to auscultation bilaterally, Ventilator   Synchrony - yes  Chest wall:    No deformity  Heart:    S1 and S2 normal, no murmur, CVP - no.  Pressors - no  Abdomen:     Soft, no masses, no organomegaly  Genitalia:    Not done  Rectal:   not done  Extremities:   Extremities- intact     Skin:   Intact in exposed areas .      Neurologic:   Sedation - fent gtt -> RASS - -3 . Moves all 4s - yes. CAM-ICU - na . Orientation - na       PULMONARY Recent Labs  Lab 10/14/17 0310 10/14/17 0330 10/14/17 0641 10/16/17 1230 10/17/17 0335 10/18/17 0347  PHART  --  7.527* 7.389 7.213* 7.331* 7.337*  PCO2ART  --  23.8* 36.9 52.5* 38.3 41.2  PO2ART  --  205* 239.0* 83.0 104 72.8*  HCO3  --  19.6* 22.3 20.3 19.7* 21.3  TCO2 20*  --  23  --   --   --   O2SAT  --  98.7 100.0 92.8 96.5 90.6    CBC Recent Labs  Lab 10/16/17 0414 10/17/17 0355 10/18/17 0500  HGB 11.1* 9.2* 9.4*  HCT 33.6* 27.5* 27.9*  WBC 20.6* 23.8* 22.8*  PLT 140* 117* 102*    COAGULATION Recent Labs  Lab 10/14/17 0329  INR 1.38    CARDIAC   Recent Labs  Lab 10/14/17 0329 10/14/17 1501 10/15/17 0300 10/15/17 0759 10/16/17 0414  TROPONINI 0.07* 0.06* 0.07* 0.06* 0.04*   No results for input(s): PROBNP in the last 168 hours.   CHEMISTRY Recent Labs  Lab 10/14/17 1501  10/15/17 0300 10/15/17 0759 10/15/17 1839 10/16/17 0414 10/16/17 1158 10/17/17 0355 10/18/17 0500  NA 136   < > 134*  --  131*  --  130* 129* 130*  K 3.3*   < > 3.6  --  3.9  --  4.8 3.9 3.3*  CL 105   < > 103  --  101  --  100* 100* 103  CO2 22   < > 21*  --  21*  --  22 22 22   GLUCOSE 112*   < > 136*  --  112*  --  119* 144* 129*  BUN 43*   < > 47*  --  46*  --  43* 45* 51*  CREATININE 1.43*   < > 2.72*  --  2.22*  --  2.14* 2.29* 2.51*  CALCIUM 7.4*   < > 7.5*  --  7.0*  --  6.8*  6.7* 6.8*  MG 1.2*  --   --  2.4  --  1.9  --  1.9 1.8  PHOS 2.9  --   --  6.6*  --  6.2*  --  5.5* 4.2   < > = values in this interval not displayed.   Estimated Creatinine Clearance: 22.8 mL/min (A) (by C-G formula based on SCr of 2.51 mg/dL (H)).   LIVER Recent Labs  Lab 10/14/17 0329 10/15/17 0759 10/15/17 1839 10/16/17 0414 10/17/17 0355  AST 41 1,586* 1,016* 898* 604*  ALT 23 828* 708* 683* 480*  ALKPHOS 125 98 99 115 128*  BILITOT 1.1 0.5 0.9 0.9 1.1  PROT 6.9 4.2* 3.8* 3.8* 3.7*  ALBUMIN 2.8* 1.6* 1.4* 1.4* 1.3*  INR 1.38  --   --   --   --      INFECTIOUS Recent Labs  Lab 10/15/17 1839 10/16/17 1158 10/17/17 0817  LATICACIDVEN 4.0* 2.5* 1.2     ENDOCRINE CBG (last 3)  Recent Labs    10/17/17 1734 10/17/17 2327 10/18/17 0631  GLUCAP 129* 133* 134*      IMAGING x48h  - image(s) personally visualized  -   highlighted in bold Dg Chest Port 1 View  Result Date: 10/18/2017 CLINICAL DATA:  Patient with acute respiratory failure. EXAM: PORTABLE CHEST 1 VIEW COMPARISON:  History graft 10/17/2017. FINDINGS: Patient is rotated to the right. ET tube terminates in the mid trachea. Right IJ central venous catheter tip projects over the superior vena cava. Enteric tube courses inferior to the diaphragm. Stable cardiac and mediastinal contours. Monitoring leads overlie the patient. Persistent small layering bilateral pleural effusions and underlying opacities. No pneumothorax. IMPRESSION: Grossly unchanged layering effusions and underlying opacities favored represent atelectasis. Stable support apparatus. Electronically Signed   By: Lovey Newcomer M.D.   On: 10/18/2017 07:21   Dg Chest Port 1 View  Result Date: 10/17/2017 CLINICAL DATA:  Acute respiratory failure. EXAM: PORTABLE CHEST 1 VIEW COMPARISON:  Radiograph of  October 15, 2017. FINDINGS: Stable cardiomediastinal silhouette. Endotracheal and nasogastric tubes are unchanged in position. Right internal jugular catheter  is unchanged in position. No pneumothorax is noted. Stable bibasilar infiltrates or atelectasis is noted with associated pleural effusions. Bony thorax is unremarkable. IMPRESSION: Stable support apparatus. Stable bibasilar opacities as described above. Electronically Signed   By: Marijo Conception, M.D.   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   10/18/2017 - > does not meet criteria for SBT/Extubation in setting of Acute Respiratory Failure due to Peritonitis and sedation   Plan Cont full vent support  PAD protocol RAS goal -2 to -3 Weaning AFTER all surgery completed  abg am cxr am Hold diuresis for now  AKI c/b lactic acidosis (may be exacerbated by on-going gut ischemia) -d/t shock w/ concern for abd compartment syndrome raised but unlikely as abd was open.  -last LA on 1/2 was increased but pressor requirements:  -sCr hopefully hitting plateau     Plan MAP > 65 Renal dose meds Strict I&O May need renal consult if cont to worsen   Shock liver -lfts improved.  Plan F/u am   Anemia of critical illness and mild thrombocytopenia  Ebl:  Plan  Cont scds [place] heparin per surgical team (when ok w/ them) Transfuse per protocol    Summary: Remains in septic shock in the setting of acute peritonitis from ischemic bowel.  Now status post second OR trip requiring  further bowel resection.  Spoke with surgical team, still concerned about integrity of small intestine.  She remains in discontinuity.  Cont supportive care. Off pressors. Back to OR 1/5    DVT prophylaxis: scds SUP: PPI  Diet: npo Activity: br Disposition : icu   The patient is critically ill with multiple organ systems failure and requires high complexity decision making for assessment and support, frequent evaluation and titration of therapies, application of advanced monitoring technologies and extensive interpretation of multiple databases.   Critical Care Time devoted to patient care services described in this note is  30  Minutes. This time reflects time of care of this signee Dr Brand Males. This critical care time does not reflect procedure time, or teaching time or supervisory time of PA/NP/Med student/Med Resident etc but could involve care discussion time    Dr. Brand Males, M.D., [facility].C.P Pulmonary and Critical Care Medicine Staff Physician [facility] Pager: [phone], If no answer or between  15:00h - 7:00h: call [phone]  10/18/2017 11:07 AM

## 2017-10-18 NOTE — Op Note (Signed)
PRE-OPERATIVE DIAGNOSIS: open abdomen, discontinuous bowel, history of ischemic bowel  POST-OPERATIVE DIAGNOSIS:  Same  PROCEDURE:  Procedure(s): Re-exploration of abdomen with end proximal ileostomy and closure of abdominal wall hernia  SURGEON:  Surgeon(s): Stark Klein, MD  ANESTHESIA:   general  DRAINS: none   LOCAL MEDICATIONS USED:  NONE  SPECIMEN:  No Specimen  DISPOSITION OF SPECIMEN:  N/A  COUNTS:  YES  DICTATION: .Dragon Dictation  PLAN OF CARE: back to ICU intubated  PATIENT DISPOSITION:  ICU - intubated and hemodynamically stable.  FINDINGS:  No additional ischemia, no purulent material in abdomen  EBL: min  PROCEDURE:  Patient was identified in the ICU and taken back to the operating room where she was placed supine on the operating room table.  General anesthesia was induced.  The superficial layer of her abdominal VAC was removed and her abdomen was prepped and draped in standard fashion.  A timeout was performed according to the surgical safety checklist.  When all was correct, we continued.  The inner layer of the wound VAC was then removed from the abdomen.  Her abdomen was explored.  There was no evidence of additional ischemia in the colon or the small intestine.  The stomach appeared normal and her liver and gallbladder appeared normal.  The small bowel was then eviscerated.  This was measured to evaluate how much small bowel was remaining.  Approximately 220 cm of small intestine were remaining.  Based on the prior history of the patient, decision was made not to reconnect the small bowel to the colon.  She had multiple episodes of ischemia and at her last operation, her small bowel did not hold the staple line well and that had to be revised several times prior to completion of the case.  She also had been weaned from pressors, but required low-dose phenylephrine for anesthesia.  A site for an ostomy was identified in the right lower quadrant.  This was  where the proximal ileum would reach.  A Kocher was used to elevate the skin and the circular incision was created.  A cruciate incision was made on the anterior fascia.  The rectus muscle was spread with the Kelly clamp and the posterior fascia was opened in cruciate fashion as well.  Care was taken to see that there were 2 fingerbreadths of defect in this location.  The proximal ileum was then pushed through the defect carefully.  There was still good blood flow to the bowel at the ostomy site.  The abdomen was then irrigated.  The fascia was closed using running #1 looped PDS suture.  The skin was irrigated and dressed ed with Kerlix and covered with a towel.  The ostomy was then matured in [place] fashion with interrupted 3-0 Vicryl pops.  The patient's abdomen was then cleaned, dried, and dressed with an ostomy appliance, ABDs and tape.  The patient was then taken back to the ICU intubated.  Needle, sponge, and instrument counts were correct x2.

## 2017-10-18 NOTE — Anesthesia Preprocedure Evaluation (Addendum)
Anesthesia Evaluation  Patient identified by MRN, date of birth, ID band Patient unresponsive    Reviewed: Allergy & Precautions, NPO status , Patient's Chart, lab work & pertinent test results, Unable to perform ROS - Chart review only  Airway Mallampati: Intubated  TM Distance: >3 FB Neck ROM: Full    Dental  (+) Teeth Intact   Pulmonary asthma , sleep apnea , COPD, Current Smoker,     + decreased breath sounds      Cardiovascular hypertension, Pt. on medications and Pt. on home beta blockers + CAD and + Peripheral Vascular Disease   Rhythm:Regular Rate:Normal     Neuro/Psych PSYCHIATRIC DISORDERS Anxiety Depression  Neuromuscular disease CVA    GI/Hepatic Neg liver ROS, hiatal hernia, GERD  Medicated,  Endo/Other  diabetes  Renal/GU Renal disease  negative genitourinary   Musculoskeletal negative musculoskeletal ROS (+)   Abdominal   Peds  Hematology negative hematology ROS (+)   Anesthesia Other Findings   Reproductive/Obstetrics                            Lab Results  Component Value Date   WBC 22.8 (H) 10/18/2017   HGB 9.4 (L) 10/18/2017   HCT 27.9 (L) 10/18/2017   MCV 83.8 10/18/2017   PLT 102 (L) 10/18/2017   Lab Results  Component Value Date   CREATININE 2.51 (H) 10/18/2017   BUN 51 (H) 10/18/2017   NA 130 (L) 10/18/2017   K 3.3 (L) 10/18/2017   CL 103 10/18/2017   CO2 22 10/18/2017   Lab Results  Component Value Date   INR 1.38 10/14/2017   INR 1.0 RATIO 10/01/2007   INR 0.9 06/04/2007   Normal stress test 2017  Anesthesia Physical Anesthesia Plan  ASA: III  Anesthesia Plan: General   Post-op Pain Management:    Induction: Inhalational  PONV Risk Score and Plan: 3 and Ondansetron, Midazolam and Treatment may vary due to age or medical condition  Airway Management Planned: Oral ETT  Additional Equipment:   Intra-op Plan:   Post-operative Plan:  Post-operative intubation/ventilation  Informed Consent: I have reviewed the patients History and Physical, chart, labs and discussed the procedure including the risks, benefits and alternatives for the proposed anesthesia with the patient or authorized representative who has indicated his/her understanding and acceptance.     Plan Discussed with: CRNA  Anesthesia Plan Comments:         Anesthesia Quick Evaluation

## 2017-10-18 NOTE — Anesthesia Postprocedure Evaluation (Signed)
Anesthesia Post Note  Patient: DAUNE DIVIRGILIO  Procedure(s) Performed: RE-EXPLORATION OF ABDOMEN, ILEOSTOMY CREATION (N/A Abdomen)     Patient location during evaluation: SICU Anesthesia Type: General Level of consciousness: sedated Pain management: pain level controlled Vital Signs Assessment: post-procedure vital signs reviewed and stable Respiratory status: patient remains intubated per anesthesia plan Cardiovascular status: stable Postop Assessment: no apparent nausea or vomiting Anesthetic complications: no    Last Vitals:  Vitals:   10/18/17 0900 10/18/17 1200  BP: (!) 125/43 (!) 152/54  Pulse: (!) 48 99  Resp: 18 (!) 22  Temp:  37.1 C  SpO2: 98% 97%    Last Pain:  Vitals:   10/18/17 0400  TempSrc: Bladder                 Effie Berkshire

## 2017-10-18 NOTE — Addendum Note (Signed)
Addendum  created 10/18/17 1633 by Montel Clock, CRNA   Intraprocedure Flowsheets edited

## 2017-10-18 NOTE — Transfer of Care (Signed)
Immediate Anesthesia Transfer of Care Note  Patient: [name]  Procedure(s) Performed: RE-EXPLORATION OF ABDOMEN, ILEOSTOMY CREATION (N/A Abdomen)  Patient Location: ICU  Anesthesia Type:General  Level of Consciousness: sedated  Airway & Oxygen Therapy: Patient remains intubated per anesthesia plan  Post-op Assessment: Report given to RN and Post -op Vital signs reviewed and stable  Post vital signs: Reviewed and stable  Last Vitals:  Vitals:   10/18/17 0800 10/18/17 0900  BP: (!) 120/29 (!) 125/43  Pulse: (!) 102 (!) 48  Resp: 18 18  Temp: 37.5 C   SpO2: 97% 98%    Last Pain:  Vitals:   10/18/17 0400  TempSrc: Bladder      Patients Stated Pain Goal: (UTA) (23/36/12 2449)  Complications: No apparent anesthesia complications

## 2017-10-19 ENCOUNTER — Inpatient Hospital Stay (HOSPITAL_COMMUNITY): Payer: Medicare Other

## 2017-10-19 DIAGNOSIS — N179 Acute kidney failure, unspecified: Secondary | ICD-10-CM

## 2017-10-19 DIAGNOSIS — L8990 Pressure ulcer of unspecified site, unspecified stage: Secondary | ICD-10-CM

## 2017-10-19 LAB — CBC WITH DIFFERENTIAL/PLATELET
BASOS PCT: 0 %
Basophils Absolute: 0 10*3/uL (ref 0.0–0.1)
EOS ABS: 0 10*3/uL (ref 0.0–0.7)
EOS PCT: 0 %
HCT: 27.4 % — ABNORMAL LOW (ref 36.0–46.0)
HEMOGLOBIN: 9.4 g/dL — AB (ref 12.0–15.0)
Lymphocytes Relative: 9 %
Lymphs Abs: 1.8 10*3/uL (ref 0.7–4.0)
MCH: 28.8 pg (ref 26.0–34.0)
MCHC: 34.3 g/dL (ref 30.0–36.0)
MCV: 84 fL (ref 78.0–100.0)
MONO ABS: 1.2 10*3/uL — AB (ref 0.1–1.0)
Monocytes Relative: 6 %
NEUTROS ABS: 17.2 10*3/uL — AB (ref 1.7–7.7)
NEUTROS PCT: 85 %
PLATELETS: 116 10*3/uL — AB (ref 150–400)
RBC: 3.26 MIL/uL — ABNORMAL LOW (ref 3.87–5.11)
RDW: 15.8 % — ABNORMAL HIGH (ref 11.5–15.5)
WBC: 20.2 10*3/uL — ABNORMAL HIGH (ref 4.0–10.5)

## 2017-10-19 LAB — CULTURE, BLOOD (ROUTINE X 2)
CULTURE: NO GROWTH
Culture: NO GROWTH
SPECIAL REQUESTS: ADEQUATE
Special Requests: ADEQUATE

## 2017-10-19 LAB — BASIC METABOLIC PANEL
Anion gap: 6 (ref 5–15)
BUN: 59 mg/dL — AB (ref 6–20)
CO2: 22 mmol/L (ref 22–32)
Calcium: 7.1 mg/dL — ABNORMAL LOW (ref 8.9–10.3)
Chloride: 104 mmol/L (ref 101–111)
Creatinine, Ser: 2.43 mg/dL — ABNORMAL HIGH (ref 0.44–1.00)
GFR, EST AFRICAN AMERICAN: 22 mL/min — AB (ref >60)
GFR, EST NON AFRICAN AMERICAN: 19 mL/min — AB (ref >60)
Glucose, Bld: 164 mg/dL — ABNORMAL HIGH (ref 65–99)
Potassium: 3.6 mmol/L (ref 3.5–5.1)
SODIUM: 132 mmol/L — AB (ref 135–145)

## 2017-10-19 LAB — GLUCOSE, CAPILLARY
GLUCOSE-CAPILLARY: 148 mg/dL — AB (ref 65–99)
Glucose-Capillary: 148 mg/dL — ABNORMAL HIGH (ref 65–99)
Glucose-Capillary: 159 mg/dL — ABNORMAL HIGH (ref 65–99)

## 2017-10-19 LAB — PHOSPHORUS: PHOSPHORUS: 4.4 mg/dL (ref 2.5–4.6)

## 2017-10-19 LAB — MAGNESIUM: MAGNESIUM: 2.2 mg/dL (ref 1.7–2.4)

## 2017-10-19 MED ORDER — FUROSEMIDE 10 MG/ML IJ SOLN
80.0000 mg | Freq: Four times a day (QID) | INTRAMUSCULAR | Status: DC
  Administered 2017-10-19 – 2017-10-20 (×4): 80 mg via INTRAVENOUS
  Filled 2017-10-19 (×4): qty 8

## 2017-10-19 MED ORDER — TRACE MINERALS CR-CU-MN-SE-ZN 10-1000-500-60 MCG/ML IV SOLN
INTRAVENOUS | Status: AC
  Administered 2017-10-19: 18:00:00 via INTRAVENOUS
  Filled 2017-10-19: qty 1992

## 2017-10-19 MED ORDER — POTASSIUM CHLORIDE 10 MEQ/50ML IV SOLN
10.0000 meq | INTRAVENOUS | Status: AC
  Administered 2017-10-19 (×4): 10 meq via INTRAVENOUS
  Filled 2017-10-19 (×4): qty 50

## 2017-10-19 NOTE — Progress Notes (Signed)
General Surgery [facility]  Assessment & Plan:  POD#1/3/5 - status post ex lap with right colectomy for ischemia, small bowel resection for infarction.  Now fascia closed with proximal ileostomy             Doing well weaning on vent on 8/5PS, but still not very awake.               Vac placement difficult this AM.               IV abx's, TNA - per pharmacy             Improving hemodynamics - remains off pressors             Given difficulty with vac, would leave off if it comes loose, and remove at date of next vac change.  Typically this is easier for patient and nursing and facilitates closure of wound, but if difficult, not worth manpower issues.          Stark Klein, MD, [facility]       Office: [phone]    Chief Complaint: Intestinal ischemia, infarction  Subjective: Abd closed yesterday.   Objective: Vital signs in last 24 hours: Temp:  [98 F (36.7 C)-100 F (37.8 C)] 98 F (36.7 C) (01/06 0800) Pulse Rate:  [33-107] 49 (01/06 0800) Resp:  [6-23] 14 (01/06 0800) BP: (110-166)/(34-89) 151/51 (01/06 0800) SpO2:  [94 %-99 %] 96 % (01/06 0846) FiO2 (%):  [30 %-40 %] 30 % (01/06 0859) Weight:  [93.2 kg (205 lb 7.5 oz)] 93.2 kg (205 lb 7.5 oz) (01/06 0200) Last BM Date: (PTA)  Intake/Output from previous day: 01/05 0701 - 01/06 0700 In: 3064.9 [I.V.:2574.9; NG/GT:40; IV Piggyback:450] Out: 1975 [Urine:1450; Emesis/NG output:500; Blood:25] Intake/Output this shift: Total I/O In: 133 [I.V.:113; NG/GT:20] Out: 250 [Urine:250]  Physical Exam: HEENT - sclerae clear, mucous membranes moist Eyes open today.  Were open upon my arrival, but did not respond to voice.  Did not follow commands.   Neck - soft Abdomen - soft, VAC in midline wound intact with good seal, serous drainage in cannister.   Ostomy pale pink.  Sweat in bag, no real output.    Lab Results:  Recent Labs    10/17/17 0355 10/18/17 0500   WBC 23.8* 22.8*  HGB 9.2* 9.4*  HCT 27.5* 27.9*  PLT 117* 102*   BMET Recent Labs    10/18/17 0500 10/19/17 0500  NA 130* 132*  K 3.3* 3.6  CL 103 104  CO2 22 22  GLUCOSE 129* 164*  BUN 51* 59*  CREATININE 2.51* 2.43*  CALCIUM 6.8* 7.1*   PT/INR No results for input(s): LABPROT, INR in the last 72 hours. Comprehensive Metabolic Panel:    Component Value Date/Time   NA 132 (L) 10/19/2017 0500   NA 130 (L) 10/18/2017 0500   K 3.6 10/19/2017 0500   K 3.3 (L) 10/18/2017 0500   CL 104 10/19/2017 0500   CL 103 10/18/2017 0500   CO2 22 10/19/2017 0500   CO2 22 10/18/2017 0500   BUN 59 (H) 10/19/2017 0500   BUN 51 (H) 10/18/2017 0500   CREATININE 2.43 (H) 10/19/2017 0500   CREATININE 2.51 (H) 10/18/2017 0500   GLUCOSE 164 (H) 10/19/2017 0500   GLUCOSE 129 (H) 10/18/2017 0500   CALCIUM 7.1 (L) 10/19/2017 0500   CALCIUM 6.8 (L) 10/18/2017 0500   AST 604 (H)  10/17/2017 0355   AST 898 (H) 10/16/2017 0414   ALT 480 (H) 10/17/2017 0355   ALT 683 (H) 10/16/2017 0414   ALKPHOS 128 (H) 10/17/2017 0355   ALKPHOS 115 10/16/2017 0414   BILITOT 1.1 10/17/2017 0355   BILITOT 0.9 10/16/2017 0414   PROT 3.7 (L) 10/17/2017 0355   PROT 3.8 (L) 10/16/2017 0414   ALBUMIN 1.3 (L) 10/17/2017 0355   ALBUMIN 1.4 (L) 10/16/2017 0414    Studies/Results: Dg Chest Port 1 View  Result Date: 10/19/2017 CLINICAL DATA:  Endotracheal tube EXAM: PORTABLE CHEST 1 VIEW COMPARISON:  10/18/2017 FINDINGS: Endotracheal tube terminates 3.5 cm above the carina. Cardiomegaly with mild interstitial edema. Small right and moderate left pleural effusions. Associated left lower lobe opacity, likely atelectasis. Enteric tube courses into the proximal stomach. Right IJ venous catheter terminates in the cavoatrial junction. IMPRESSION: Cardiomegaly with mild interstitial edema and bilateral pleural effusions, left greater than right. Endotracheal tube terminates 3.5 cm above the carina. Additional support apparatus  as above. Electronically Signed   By: Julian Hy M.D.   On: 10/19/2017 07:25   Dg Chest Port 1 View  Result Date: 10/18/2017 CLINICAL DATA:  Patient with acute respiratory failure. EXAM: PORTABLE CHEST 1 VIEW COMPARISON:  History graft 10/17/2017. FINDINGS: Patient is rotated to the right. ET tube terminates in the mid trachea. Right IJ central venous catheter tip projects over the superior vena cava. Enteric tube courses inferior to the diaphragm. Stable cardiac and mediastinal contours. Monitoring leads overlie the patient. Persistent small layering bilateral pleural effusions and underlying opacities. No pneumothorax. IMPRESSION: Grossly unchanged layering effusions and underlying opacities favored represent atelectasis. Stable support apparatus. Electronically Signed   By: Lovey Newcomer M.D.   On: 10/18/2017 07:21      Stark Klein 10/19/2017

## 2017-10-19 NOTE — Progress Notes (Addendum)
[place] NOTE   Pharmacy Consult for TPN & Zosyn Indication: extensive large and small bowel resection  Patient Measurements: Height: _0  (162.6 cm) Weight: 205 lb 7.5 oz (93.2 kg) IBW/kg (Calculated) : 54.7 TPN AdjBW (KG): 60.8 Body mass index is 35.27 kg/m.  Insulin Requirements: Hx of DM. 3 Units SSI/24 hrs,   Current Nutrition: NPO  IVF: dc LR at 20 ml/hr on 1/6  Central access: CVC from 1/1 TPN start date: 1/3 ID: day #6 zosyn, day # 4 eraxis. WBC 22.8 Cr 2.43 AF  Antimicrobials this admission:  1/1 vanc x 1 0329 1/1 zosyn >> 1/3 Anidulafungin >>  Dose change: 1/2 ZEI> 2.25 q8 > back to [facility] for CrCl > 20 Microbiology results:  1/1 Ucx: ng-final 1/1 BCx: ngtd 1/1 MRSA PCR: neg  ASSESSMENT                                                                                                          HPI: 71 yoF to ED 1/1 with abd pain, pneumatosis R colon > surgery. Hx of CAD, DM, Diverticulosis, HPL, HTN, chronic pancreatitis noted CVA  Significant events:  1/1 OR exp lap. R colectomy, wound vac 1/3 return to OR- expl lap > sm bowel ischemia, resected ischemic ileum > VAC replaced. Begin TPN  1/5 OR Re-exploration of abdomen with end proximal ileostomy and closure of abdominal wall hernia  Today:   Glucose - DM on Victoza PTA, CBGS all < 150. Serum glucose 164  Electrolytes -lytes added to TPN 1/5;  Na improved to 132, K low at 3.6 after 3 runs,  Phos 4.4, Mag 2.2 after 2 gm per MD, CoCa 9.26  Renal - SCr 2.43 1 I/O  + 543, NG 500 ml/24, drains 250 ml/24 hrs  LFTs - AST/ALT  Elevated but coming down, Alk Phos SL elevated. Bilirubin wnl  TGs - were 349 on 1/1, Propofol d/c 1/2, Trig 366 1/4  Prealbumin - < 5  2nd critical illness/inflammation  NUTRITIONAL GOALS                                                                                             RD recs: 10/17/17 Kcal 7494-4967 , protein 109-119  gm  Hold 20% lipid emulsion for first 7 days for ICU patients per ASPEN guidelines (Start date 10/17/17)  Clinimix 5/15 at a goal rate of 85 ml/hr to provide:1448 kcal and 96 gm protein.  113% kcal and 94 % protein.  g/day protein, Kcal/day.  PLAN  At 1800 today:  Increase Clinimix E 5/15 to goal rate of 83 ml/hr This provides 1414 Kcal and 100 gm protein (111% kcal and 92% protein) Hold 20% lipid emulsion for first 7 days for ICU patients per ASPEN guidelines (Start date 10/17/17)  TPN to contain standard multivitamins and trace elements.  DC IVF  Continue sensitive SSI q6h - may be able to stop if CBGs controlled at goal rate  TPN lab panels on Mondays & Thursdays.  F/u daily.  Continue zosyn 3.375 gm IV q8h infuse each dose over 4 hours  Continue eraxis 100 mg IV q24  Eudelia Bunch, Pharm.D. [phone] 10/19/2017 7:38 AM

## 2017-10-19 NOTE — Progress Notes (Addendum)
PULMONARY / CRITICAL CARE MEDICINE   Name: [name] MRN: [id number] DOB: [birth year] PCP Leanna Battles, MD LOS 5 as of 10/19/2017     ADMISSION DATE:  10/14/2017 CONSULTATION DATE:  10/19/2017   REFERRING MD:  Surgery  CHIEF COMPLAINT:  Post op respiratory failure  brief 72 year old female with multiple medical problems.  Presented acutely with abdominal pain found to have pneumatosis of the right colon on CT scan.  Status post emergent laparotomy on the morning of October 14, 2017 with extended right colectomy and application of abdominal vacuum dressing by Dr. Autumn Messing and estimated blood loss of 100 cc postoperatively.  Patient was sent to the intensive care unit in the intubated state and critical care medicine was consulted for postoperative acute respiratory failure and medical management, RN reports pain, hyp0ertension, vent dysnchrony in ICU. ET tube is at carina  EVENTS 10/14/2017 -admit 10/17/17 - Off pressors  10/18/17 - back from OR trip #3 - sp partial wound closure and ileostomy. Not on pressors. On fent gtt. Getting K. Making some urine   SUBJECTIVE/OVERNIGHT/INTERVAL HX 10/19/17 - RN reporting stage 3 - 2 inch sacral decu post hospitalization and also forehead probe related stage 2 skin break downs. Left hand also breakdown. ON TNA>. WUA in progress. Anasarca + +19.85L balance   VITAL SIGNS: BP (!) 169/57   Pulse 92   Temp 99.9 F (37.7 C)   Resp 18   Ht 5\' 4"  (1.626 m)   Wt 93.2 kg (205 lb 7.5 oz)   SpO2 97%   BMI 35.27 kg/m   HEMODYNAMICS: CVP:  [8 mmHg-10 mmHg] 9 mmHg  VENTILATOR SETTINGS: Vent Mode: CPAP;PSV FiO2 (%):  [30 %] 30 % Set Rate:  [18 bmp] 18 bmp Vt Set:  [440 mL] 440 mL PEEP:  [5 cmH20] 5 cmH20 Pressure Support:  [8 cmH20] 8 cmH20 Plateau Pressure:  [15 cmH20-22 cmH20] 15 cmH20  INTAKE / OUTPUT:  Intake/Output Summary (Last 24 hours) at 10/19/2017 1330 Last data filed at 10/19/2017 1300 Gross per 24 hour  Intake 2244.9 ml  Output 1750  ml  Net 494.9 ml    EXAM  General Appearance:    Looks criticall ill OBESE - yes  Head:    Normocephalic, without obvious abnormality, atraumatic  Eyes:    PERRL - yes, conjunctiva/corneas - clear      Ears:    Normal external ear canals, both ears  Nose:   NG tube - no  Throat:  ETT TUBE - yes , OG tube - yes  Neck:   Supple,  No enlargement/tenderness/nodules     Lungs:     Clear to auscultation bilaterally, Ventilator   Synchrony - yes. Doing PSV  Chest wall:    No deformity  Heart:    S1 and S2 normal, no murmur, CVP - no.  Pressors - no  Abdomen:     Soft, no masses, no organomegaly  Genitalia:    Not done  Rectal:   not done  Extremities:   Extremities- intact     Skin:   Intact in exposed areas . Sacral area - decub in sacraum but also break down in foreeahd covered by Suzan Garibaldi     Neurologic:   Sedation - fent gtt -> RASS - -2 . Moves all 4s - yes but very slow          PULMONARY Recent Labs  Lab 10/14/17 0310 10/14/17 0330 10/14/17 0641 10/16/17 1230 10/17/17 0335 10/18/17 0347  PHART  --  7.527* 7.389 7.213* 7.331* 7.337*  PCO2ART  --  23.8* 36.9 52.5* 38.3 41.2  PO2ART  --  205* 239.0* 83.0 104 72.8*  HCO3  --  19.6* 22.3 20.3 19.7* 21.3  TCO2 20*  --  23  --   --   --   O2SAT  --  98.7 100.0 92.8 96.5 90.6    CBC Recent Labs  Lab 10/17/17 0355 10/18/17 0500 10/19/17 0930  HGB 9.2* 9.4* 9.4*  HCT 27.5* 27.9* 27.4*  WBC 23.8* 22.8* 20.2*  PLT 117* 102* 116*    COAGULATION Recent Labs  Lab 10/14/17 0329  INR 1.38    CARDIAC   Recent Labs  Lab 10/14/17 0329 10/14/17 1501 10/15/17 0300 10/15/17 0759 10/16/17 0414  TROPONINI 0.07* 0.06* 0.07* 0.06* 0.04*   No results for input(s): PROBNP in the last 168 hours.   CHEMISTRY Recent Labs  Lab 10/15/17 0759 10/15/17 1839 10/16/17 0414 10/16/17 1158 10/17/17 0355 10/18/17 0500 10/19/17 0500  NA  --  131*  --  130* 129* 130* 132*  K  --  3.9  --  4.8 3.9 3.3* 3.6  CL  --   101  --  100* 100* 103 104  CO2  --  21*  --  22 22 22 22   GLUCOSE  --  112*  --  119* 144* 129* 164*  BUN  --  46*  --  43* 45* 51* 59*  CREATININE  --  2.22*  --  2.14* 2.29* 2.51* 2.43*  CALCIUM  --  7.0*  --  6.8* 6.7* 6.8* 7.1*  MG 2.4  --  1.9  --  1.9 1.8 2.2  PHOS 6.6*  --  6.2*  --  5.5* 4.2 4.4   Estimated Creatinine Clearance: 23.8 mL/min (A) (by C-G formula based on SCr of 2.43 mg/dL (H)).   LIVER Recent Labs  Lab 10/14/17 0329 10/15/17 0759 10/15/17 1839 10/16/17 0414 10/17/17 0355  AST 41 1,586* 1,016* 898* 604*  ALT 23 828* 708* 683* 480*  ALKPHOS 125 98 99 115 128*  BILITOT 1.1 0.5 0.9 0.9 1.1  PROT 6.9 4.2* 3.8* 3.8* 3.7*  ALBUMIN 2.8* 1.6* 1.4* 1.4* 1.3*  INR 1.38  --   --   --   --      INFECTIOUS Recent Labs  Lab 10/15/17 1839 10/16/17 1158 10/17/17 0817  LATICACIDVEN 4.0* 2.5* 1.2     ENDOCRINE CBG (last 3)  Recent Labs    10/18/17 2338 10/19/17 0628 10/19/17 1150  GLUCAP 140* 148* 148*      IMAGING x48h  - image(s) personally visualized  -   highlighted in bold Dg Chest Port 1 View  Result Date: 10/19/2017 CLINICAL DATA:  Endotracheal tube EXAM: PORTABLE CHEST 1 VIEW COMPARISON:  10/18/2017 FINDINGS: Endotracheal tube terminates 3.5 cm above the carina. Cardiomegaly with mild interstitial edema. Small right and moderate left pleural effusions. Associated left lower lobe opacity, likely atelectasis. Enteric tube courses into the proximal stomach. Right IJ venous catheter terminates in the cavoatrial junction. IMPRESSION: Cardiomegaly with mild interstitial edema and bilateral pleural effusions, left greater than right. Endotracheal tube terminates 3.5 cm above the carina. Additional support apparatus as above. Electronically Signed   By: Julian Hy M.D.   On: 10/19/2017 07:25   Dg Chest Port 1 View  Result Date: 10/18/2017 CLINICAL DATA:  Patient with acute respiratory failure. EXAM: PORTABLE CHEST 1 VIEW COMPARISON:  History graft  10/17/2017. FINDINGS: Patient is rotated to the right. ET  tube terminates in the mid trachea. Right IJ central venous catheter tip projects over the superior vena cava. Enteric tube courses inferior to the diaphragm. Stable cardiac and mediastinal contours. Monitoring leads overlie the patient. Persistent small layering bilateral pleural effusions and underlying opacities. No pneumothorax. IMPRESSION: Grossly unchanged layering effusions and underlying opacities favored represent atelectasis. Stable support apparatus. Electronically Signed   By: Lovey Newcomer M.D.   On: 10/18/2017 07:21    ASSESSMENT and PLAN Resolved issues: Troponin leak   Active issues   Septic shock in setting of ischemic bowel w/ Necrosis of proximal colon s/p right colectomy 10/14/2017 c/b by on-going ischemic gut on second look 1/3 requiring further resection and left again in discontinuity  - s/p re-exploration and resection w/ wound vac placed again 1/3;    On 10/19/17 - - fever curve improved .  pressors:  None on 10/19/2017   Plan NPO TPN per surg Anti-infectives (From admission, onward)   Start     Dose/Rate Route Frequency Ordered Stop   10/17/17 1200  anidulafungin (ERAXIS) 100 mg in sodium chloride 0.9 % 100 mL IVPB     100 mg 78 mL/hr over 100 Minutes Intravenous Every 24 hours 10/16/17 1110     10/16/17 1200  anidulafungin (ERAXIS) 200 mg in sodium chloride 0.9 % 200 mL IVPB     200 mg 78 mL/hr over 200 Minutes Intravenous  Once 10/16/17 1110 10/16/17 1659   10/16/17 0200  piperacillin-tazobactam (ZOSYN) IVPB 3.375 g     3.375 g 12.5 mL/hr over 240 Minutes Intravenous Every 8 hours 10/15/17 2030     10/15/17 1000  piperacillin-tazobactam (ZOSYN) IVPB 2.25 g  Status:  Discontinued     2.25 g 100 mL/hr over 30 Minutes Intravenous Every 8 hours 10/15/17 0823 10/15/17 2028   10/14/17 1000  piperacillin-tazobactam (ZOSYN) IVPB 3.375 g  Status:  Discontinued     3.375 g 12.5 mL/hr over 240 Minutes Intravenous  Every 8 hours 10/14/17 0805 10/15/17 0823   10/14/17 0315  piperacillin-tazobactam (ZOSYN) IVPB 3.375 g     3.375 g 100 mL/hr over 30 Minutes Intravenous  Once 10/14/17 0302 10/14/17 0440   10/14/17 0315  vancomycin (VANCOCIN) IVPB 1000 mg/200 mL premix     1,000 mg 200 mL/hr over 60 Minutes Intravenous  Once 10/14/17 0303 10/14/17 0440        Acute respiratory failure with hypoxia (HCC) Bilateral pleural effusions w/ bibasilar atx.   10/19/2017 - > does not meet criteria for Extubation in setting of Acute Respiratory Failure due to Peritonitis and sedation. Ok for SBT   Plan PSV as tolerated Cont full vent support  PAD protocol RAS goal 0 to -2; start lifting off sedation   AKI c/b lactic acidosis (may be exacerbated by on-going gut ischemia) -d/t shock w/ concern for abd compartment syndrome raised but unlikely as abd was open.    - 16/19 - AKI improving. Nearly 19L volume overload   Plan MAP > 65 Renal dose meds Strict I&O Lasix start 11/01/17  Shock liver -lfts improved.  Plan  F/u am   Anemia of critical illness and mild thrombocytopenia   Plan  Cont scds [place] heparin per surgical team (when ok w/ them) ; - need to check with [facility] on 10/20/17 - PRBC for hgb </= 6.9gm%    - exceptions are   -  if ACS susepcted/confirmed then transfuse for hgb </= 8.0gm%,  or    -  active bleeding with hemodynamic instability,  then transfuse regardless of hemoglobin value   At at all times try to transfuse 1 unit prbc as possible with exception of active hemorrhage    DERM A: sacral and forehead decub P WOC   DVT prophylaxis: scds SUP: PPI  Diet: npo but tPN Activity: br Disposition : icu Family: updated brother and uncle at bedside   The patient is critically ill with multiple organ systems failure and requires high complexity decision making for assessment and support, frequent evaluation and titration of therapies, application of advanced monitoring technologies and  extensive interpretation of multiple databases.   Critical Care Time devoted to patient care services described in this note is  30  Minutes. This time reflects time of care of this signee Dr Brand Males. This critical care time does not reflect procedure time, or teaching time or supervisory time of PA/NP/Med student/Med Resident etc but could involve care discussion time    Dr. Brand Males, M.D., [facility].C.P Pulmonary and Critical Care Medicine Staff Physician [place] [facility] Pager: [phone], If no answer or between  15:00h - 7:00h: call [phone]  10/19/2017 1:30 PM

## 2017-10-19 NOTE — Consult Note (Signed)
[place] Nurse wound consult note Reason for Consult: NPWT placement to full thickness midline wound per request of [facility] Surgeon, Dr. Mamie Laurel. Wound type: Midline incision Pressure Injury POA: N/A Measurement:24.5cm x 3cm x 2.5cm Wound bed: 50% yellow slough, 50% red tissue Drainage (amount, consistency, odor) scant serous Periwound: intact. Ileostomy at 8 o'clock near to lateral wound edge. Dressing procedure/placement/frequency: Wound cleansed with NS, patted dry. 1 ostomy skin barrier ring used to enhance seal by placing a border at the most most distal portion of wound (5-7 o'clock) as wound falls within a deep subpannicular crease. 1 piece of black foam used to obliterate dead space. Covered with drape fashioned around ostomy pouching system.  Attached to negative pressure and an immediate seal is achieved.  Next change due on Tuesday, 10/21/17.  [place] Nurse wound consult note Reason for Consult: deep tissue pressure injury to coccygeal area Wound type: pressure, perfusion Pressure Injury POA:No Measurement:7cm x 7cm deep purple/maroon tissue injury in evolution with sloughing of epidermis noted today. Wound bed:See above Drainage (amount, consistency, odor) None Periwound: intact Dressing procedure/placement/frequency: Patient is on a mattress with low air loss feature (ICU bed). A silicone foam dressing for the sacrum is intact and will continue as part of the POC.  Turning side to side and limiting supine position will help, but it is expected that this injury will evolve into a full thickness ulcer. Patient has been to the OR three times and remain critically ill.   [place] Nurse wound consult note Reason for Consult: Medical device related skin injury (MDRPI) to forehead, full thickness. Wound shape is consistent with pulse oximetry monitoring device. Wound type:MDRPI/Pressure Pressure Injury POA: No Measurement:3cm x 9cm  Wound WER:XVQM purple tissue with sloughing epidermis.  This tissue  injury is an evolving injury and is expected to result in a full thickness wound. Drainage (amount, consistency, odor) None Periwound: intact Dressing procedure/placement/frequency:A gentle bordered silicone foam dressing is placed after cleansing with NS.   [facility] Nurse wound consult note Reason for Consult: Medical adhesive related skin injury in the inframammary area of the left breast. Wound shape is consistent with leectrode Wound type: MARSI Pressure Injury POA: N/A Measurement:4cm x 17cm x 0.1cm Wound GQQ:PYPPJKDT blisters, tissue beneath ruptured blister is pink, moist Drainage (amount, consistency, odor) scant serous Periwound: Intact Dressing procedure/placement/frequency: The areas are covered with thin film dressing (Tegaderm) for its moisture vapor transfer (MVT) properties and for visibility of wounds.  The breast tissue would rest on the tissue injury without a dressing.  [place] Nurse wound consult note Reason for Consult: Left hand IV infiltarate Wound type: IV infiltrate Pressure Injury POA: N/A Measurement:7cm x 9cm with two blood filled blisters in the center, the largest of which measures 4cm x 3cm Wound bed:No wound visible Drainage (amount, consistency, odor) None Periwound:erythematous, edematous Dressing procedure/placement/frequency: No dressing indicated.  Extremity is elevated on a pillow.   [place] Nurse ostomy consult note Stoma type/location: RUQ ileostomy Stomal assessment/size: 1 and 1/8 inch round, slightly budded, dark with congealed blood covering mucosa Peristomal assessment: intact Treatment options for stomal/peristomal skin: skin barrier ring Output: serosanguinous in small amount  Ostomy pouching: 1pc. Convex drainable pouch with skin barrier ring.  Education provided: None. Patient is intubated, sedated and critically ill Enrolled patient in [place] program: No   I am assisted in wound and ostomy care today by Bedside RN, Amy.  Her  assistance and expertise is appreciated.   Time spent on this encounter inclusive of  documentation is 80 minutes.  [place] nursing team will follow, and will remain available to this patient, the nursing, surgical  and medical teams.   Thank you for inviting [place] to join the team caring for this patient.  Maudie Flakes, MSN, RN, [place], [facility]  Pager# [phone]

## 2017-10-20 ENCOUNTER — Encounter (HOSPITAL_COMMUNITY): Payer: Self-pay | Admitting: General Surgery

## 2017-10-20 LAB — DIFFERENTIAL
BASOS PCT: 0 %
Basophils Absolute: 0 10*3/uL (ref 0.0–0.1)
EOS PCT: 0 %
Eosinophils Absolute: 0 10*3/uL (ref 0.0–0.7)
LYMPHS PCT: 11 %
Lymphs Abs: 2.5 10*3/uL (ref 0.7–4.0)
Monocytes Absolute: 1.2 10*3/uL — ABNORMAL HIGH (ref 0.1–1.0)
Monocytes Relative: 5 %
NEUTROS ABS: 19.4 10*3/uL — AB (ref 1.7–7.7)
Neutrophils Relative %: 84 %

## 2017-10-20 LAB — CBC
HEMATOCRIT: 30.3 % — AB (ref 36.0–46.0)
Hemoglobin: 10.2 g/dL — ABNORMAL LOW (ref 12.0–15.0)
MCH: 28.3 pg (ref 26.0–34.0)
MCHC: 33.7 g/dL (ref 30.0–36.0)
MCV: 83.9 fL (ref 78.0–100.0)
Platelets: 151 10*3/uL (ref 150–400)
RBC: 3.61 MIL/uL — AB (ref 3.87–5.11)
RDW: 15.8 % — AB (ref 11.5–15.5)
WBC: 23.1 10*3/uL — ABNORMAL HIGH (ref 4.0–10.5)

## 2017-10-20 LAB — PHOSPHORUS: Phosphorus: 5.1 mg/dL — ABNORMAL HIGH (ref 2.5–4.6)

## 2017-10-20 LAB — COMPREHENSIVE METABOLIC PANEL
ALBUMIN: 1.4 g/dL — AB (ref 3.5–5.0)
ALK PHOS: 213 U/L — AB (ref 38–126)
ALT: 127 U/L — AB (ref 14–54)
AST: 106 U/L — AB (ref 15–41)
Anion gap: 9 (ref 5–15)
BUN: 65 mg/dL — AB (ref 6–20)
CALCIUM: 7.6 mg/dL — AB (ref 8.9–10.3)
CO2: 22 mmol/L (ref 22–32)
Chloride: 103 mmol/L (ref 101–111)
Creatinine, Ser: 2.57 mg/dL — ABNORMAL HIGH (ref 0.44–1.00)
GFR calc Af Amer: 21 mL/min — ABNORMAL LOW (ref >60)
GFR calc non Af Amer: 18 mL/min — ABNORMAL LOW (ref >60)
GLUCOSE: 176 mg/dL — AB (ref 65–99)
Potassium: 3.8 mmol/L (ref 3.5–5.1)
SODIUM: 134 mmol/L — AB (ref 135–145)
Total Bilirubin: 1 mg/dL (ref 0.3–1.2)
Total Protein: 5 g/dL — ABNORMAL LOW (ref 6.5–8.1)

## 2017-10-20 LAB — GLUCOSE, CAPILLARY
GLUCOSE-CAPILLARY: 158 mg/dL — AB (ref 65–99)
GLUCOSE-CAPILLARY: 173 mg/dL — AB (ref 65–99)
GLUCOSE-CAPILLARY: 149 mg/dL — AB (ref 65–99)
Glucose-Capillary: 164 mg/dL — ABNORMAL HIGH (ref 65–99)
Glucose-Capillary: 179 mg/dL — ABNORMAL HIGH (ref 65–99)

## 2017-10-20 LAB — TRIGLYCERIDES: Triglycerides: 168 mg/dL — ABNORMAL HIGH (ref <150)

## 2017-10-20 LAB — MAGNESIUM: Magnesium: 2.1 mg/dL (ref 1.7–2.4)

## 2017-10-20 LAB — PREALBUMIN: Prealbumin: <5 mg/dL — ABNORMAL LOW (ref 18–38)

## 2017-10-20 MED ORDER — TRACE MINERALS CR-CU-MN-SE-ZN 10-1000-500-60 MCG/ML IV SOLN
INTRAVENOUS | Status: AC
  Administered 2017-10-20: 20:00:00 via INTRAVENOUS
  Filled 2017-10-20: qty 1992

## 2017-10-20 MED ORDER — SODIUM CHLORIDE 0.9% FLUSH
10.0000 mL | Freq: Two times a day (BID) | INTRAVENOUS | Status: DC
  Administered 2017-10-20 – 2017-10-24 (×5): 10 mL

## 2017-10-20 MED ORDER — POTASSIUM CHLORIDE 10 MEQ/50ML IV SOLN
10.0000 meq | INTRAVENOUS | Status: AC
  Administered 2017-10-20 – 2017-10-21 (×4): 10 meq via INTRAVENOUS
  Filled 2017-10-20 (×4): qty 50

## 2017-10-20 MED ORDER — FUROSEMIDE 10 MG/ML IJ SOLN
80.0000 mg | Freq: Three times a day (TID) | INTRAMUSCULAR | Status: DC
  Administered 2017-10-20 – 2017-10-21 (×3): 80 mg via INTRAVENOUS
  Filled 2017-10-20 (×3): qty 8

## 2017-10-20 MED ORDER — INSULIN ASPART 100 UNIT/ML ~~LOC~~ SOLN
0.0000 [IU] | SUBCUTANEOUS | Status: DC
  Administered 2017-10-20: 2 [IU] via SUBCUTANEOUS
  Administered 2017-10-20 (×2): 3 [IU] via SUBCUTANEOUS
  Administered 2017-10-21: 2 [IU] via SUBCUTANEOUS
  Administered 2017-10-21 – 2017-10-22 (×8): 3 [IU] via SUBCUTANEOUS
  Administered 2017-10-22 (×2): 5 [IU] via SUBCUTANEOUS
  Administered 2017-10-22 – 2017-10-23 (×7): 3 [IU] via SUBCUTANEOUS
  Administered 2017-10-24: 2 [IU] via SUBCUTANEOUS
  Administered 2017-10-24 (×3): 3 [IU] via SUBCUTANEOUS
  Administered 2017-10-24: 5 [IU] via SUBCUTANEOUS
  Administered 2017-10-24: 3 [IU] via SUBCUTANEOUS
  Administered 2017-10-24: 2 [IU] via SUBCUTANEOUS
  Administered 2017-10-25 – 2017-10-26 (×10): 3 [IU] via SUBCUTANEOUS
  Administered 2017-10-26 – 2017-10-27 (×4): 2 [IU] via SUBCUTANEOUS
  Administered 2017-10-27: 3 [IU] via SUBCUTANEOUS
  Administered 2017-10-28 – 2017-10-30 (×10): 2 [IU] via SUBCUTANEOUS
  Administered 2017-10-30: 3 [IU] via SUBCUTANEOUS
  Administered 2017-10-30 (×2): 2 [IU] via SUBCUTANEOUS
  Administered 2017-10-30: 3 [IU] via SUBCUTANEOUS
  Administered 2017-10-31 (×6): 2 [IU] via SUBCUTANEOUS
  Administered 2017-11-01: 3 [IU] via SUBCUTANEOUS
  Administered 2017-11-01: 2 [IU] via SUBCUTANEOUS
  Administered 2017-11-01: 3 [IU] via SUBCUTANEOUS
  Administered 2017-11-01 – 2017-11-02 (×2): 2 [IU] via SUBCUTANEOUS
  Administered 2017-11-02: 3 [IU] via SUBCUTANEOUS
  Administered 2017-11-02: 2 [IU] via SUBCUTANEOUS
  Administered 2017-11-02: 3 [IU] via SUBCUTANEOUS
  Administered 2017-11-03 – 2017-11-04 (×5): 2 [IU] via SUBCUTANEOUS
  Administered 2017-11-04: 3 [IU] via SUBCUTANEOUS
  Administered 2017-11-04 (×2): 2 [IU] via SUBCUTANEOUS
  Administered 2017-11-05: 3 [IU] via SUBCUTANEOUS
  Administered 2017-11-05 (×2): 2 [IU] via SUBCUTANEOUS
  Administered 2017-11-05: 3 [IU] via SUBCUTANEOUS
  Administered 2017-11-05 – 2017-11-06 (×2): 2 [IU] via SUBCUTANEOUS

## 2017-10-20 MED ORDER — SODIUM CHLORIDE 0.9% FLUSH
10.0000 mL | INTRAVENOUS | Status: DC | PRN
  Administered 2017-10-29 – 2017-11-01 (×2): 10 mL
  Filled 2017-10-20 (×2): qty 40

## 2017-10-20 MED ORDER — POTASSIUM CHLORIDE 10 MEQ/50ML IV SOLN
10.0000 meq | INTRAVENOUS | Status: DC
  Filled 2017-10-20 (×4): qty 50

## 2017-10-20 MED ORDER — FENTANYL CITRATE (PF) 100 MCG/2ML IJ SOLN
25.0000 ug | INTRAMUSCULAR | Status: DC | PRN
  Administered 2017-10-20 – 2017-10-22 (×7): 25 ug via INTRAVENOUS
  Filled 2017-10-20 (×7): qty 2

## 2017-10-20 MED ORDER — MIDAZOLAM HCL 2 MG/2ML IJ SOLN: 1.0000 mg | INTRAMUSCULAR | Status: DC | PRN

## 2017-10-20 MED ORDER — DEXMEDETOMIDINE HCL IN NACL 200 MCG/50ML IV SOLN
0.4000 ug/kg/h | INTRAVENOUS | Status: DC
  Administered 2017-10-20 (×2): 0.6 ug/kg/h via INTRAVENOUS
  Administered 2017-10-20 (×2): 0.4 ug/kg/h via INTRAVENOUS
  Administered 2017-10-21: 0.402 ug/kg/h via INTRAVENOUS
  Administered 2017-10-21: 0.8 ug/kg/h via INTRAVENOUS
  Administered 2017-10-21 (×3): 0.4 ug/kg/h via INTRAVENOUS
  Administered 2017-10-22: 0.5 ug/kg/h via INTRAVENOUS
  Administered 2017-10-22: 0.8 ug/kg/h via INTRAVENOUS
  Administered 2017-10-22: 0.6 ug/kg/h via INTRAVENOUS
  Filled 2017-10-20 (×12): qty 50

## 2017-10-20 MED ORDER — SODIUM CHLORIDE 0.9 % IV SOLN
25.0000 ug/h | INTRAVENOUS | Status: DC
  Filled 2017-10-20: qty 50

## 2017-10-20 MED ORDER — FENTANYL BOLUS VIA INFUSION
25.0000 ug | INTRAVENOUS | Status: DC | PRN
  Filled 2017-10-20: qty 25

## 2017-10-20 MED ORDER — CHLORHEXIDINE GLUCONATE CLOTH 2 % EX PADS
6.0000 | MEDICATED_PAD | Freq: Every day | CUTANEOUS | Status: DC
  Administered 2017-10-20 – 2017-11-06 (×15): 6 via TOPICAL

## 2017-10-20 MED ORDER — FENTANYL CITRATE (PF) 100 MCG/2ML IJ SOLN
50.0000 ug | Freq: Once | INTRAMUSCULAR | Status: AC
  Administered 2017-10-20: 50 ug via INTRAVENOUS
  Filled 2017-10-20: qty 2

## 2017-10-20 NOTE — Progress Notes (Signed)
[facility] Progress Note Patient Name: [name] DOB: [birth year] MRN: [id number]   Date of Service  10/20/2017  HPI/Events of Note  Temp to 101.3 F - Request for Tylenol. AST = 106 and ALT = 127. Creatinine = 2.57. Therefore, will avoid Tylenol and Motrin. Cooling blankets not available at [facility]. Already on Zosyn and Eraxis.   eICU Interventions  Will order: 1. Ice packs to axilla and groin PRN.     Intervention Category Major Interventions: Infection - evaluation and management  Sommer,Steven Eugene 10/20/2017, 7:45 PM

## 2017-10-20 NOTE — Progress Notes (Signed)
PULMONARY / CRITICAL CARE MEDICINE   Name: [name] MRN: [id number] DOB: [birth year]    ADMISSION DATE:  10/14/2017 CONSULTATION DATE:  10/14/2017  REFERRING MD:  Marlou Starks  CHIEF COMPLAINT:  Abdominal pain  HISTORY OF PRESENT ILLNESS:   72 y/o female with multiple medical problems admitted on 1/1 with abdominal pain due to ischemic colon and ileum.   Past Medical History:  Diagnosis Date  . Anxiety   . Arthropathy, unspecified, site unspecified   . Blind loop syndrome   . Carotid artery disease ([place])    documented occlusion of left ICA  . Coronary artery disease    a. known 60-70% mid-LAD stenosis with caths in 1999, 2002, 2006, and 2012 showing stable anatomy b. low-risk NST in 10/2015  . Depression   . Diabetes insipidus ([place])   . Diabetes mellitus   . Diverticulosis   . Dizziness    chronic  . Dyslipidemia   . Dyspnea    with exertion  . Fatty liver   . GERD (gastroesophageal reflux disease)    hiatial hernia  . Hepatomegaly   . Hiatal hernia   . HTN (hypertension)   . Hyperlipidemia   . Hypertension   . Irritable bowel syndrome   . Melanosis coli   . Metatarsal fracture right  . Normal nuclear stress test    nml 01/03/10;06/19/07  . Other, mixed, or unspecified nondependent drug abuse, unspecified   . Pancreatitis, chronic ([place])   . Peripheral vascular disease ([place]) 02/20/10   s/p stent of left SFA  . Sleep apnea   . Stroke ([place])   . Thyroid nodule   . Unspecified essential hypertension   . Vitamin B12 deficiency      SUBJECTIVE:  On SPT, shallow breaths  VITAL SIGNS: BP (!) 149/66 (BP Location: Right Arm)   Pulse (!) 50   Temp 100 F (37.8 C)   Resp 20   Ht 5\' 4"  (1.626 m)   Wt 93.6 kg (206 lb 5.6 oz)   SpO2 98%   BMI 35.42 kg/m   HEMODYNAMICS:    VENTILATOR SETTINGS: Vent Mode: PSV FiO2 (%):  [30 %] 30 % Set Rate:  [18 bmp] 18 bmp Vt Set:  [440 mL] 440 mL PEEP:  [5 cmH20] 5 cmH20 Pressure Support:  [5 cmH20-8 cmH20] 5 cmH20 Plateau  Pressure:  [14 cmH20-18 cmH20] 14 cmH20  INTAKE / OUTPUT: I/O last 3 completed shifts: In: 3680.7 [I.V.:3140.7; NG/GT:40; IV Piggyback:500] Out: 4696 [Urine:4475; Emesis/NG output:725; Drains:200; Stool:10]  PHYSICAL EXAMINATION:  General:  In bed on vent HENT: NCAT ETT in place, facial edema ntoed PULM: Crackles bases B, vent supported breathing CV: RRR, no mgr GI: would vac, ostomy in place MSK: normal bulk and tone Derm: edema arms/legs Neuro: sedated on vent    LABS:  BMET Recent Labs  Lab 10/18/17 0500 10/19/17 0500 10/20/17 0500  NA 130* 132* 134*  K 3.3* 3.6 3.8  CL 103 104 103  CO2 22 22 22   BUN 51* 59* 65*  CREATININE 2.51* 2.43* 2.57*  GLUCOSE 129* 164* 176*    Electrolytes Recent Labs  Lab 10/18/17 0500 10/19/17 0500 10/20/17 0500  CALCIUM 6.8* 7.1* 7.6*  MG 1.8 2.2 2.1  PHOS 4.2 4.4 5.1*    CBC Recent Labs  Lab 10/18/17 0500 10/19/17 0930 10/20/17 0500  WBC 22.8* 20.2* 23.1*  HGB 9.4* 9.4* 10.2*  HCT 27.9* 27.4* 30.3*  PLT 102* 116* 151    Coag's Recent Labs  Lab 10/14/17  0329  INR 1.38    Sepsis Markers Recent Labs  Lab 10/15/17 1839 10/16/17 1158 10/17/17 0817  LATICACIDVEN 4.0* 2.5* 1.2    ABG Recent Labs  Lab 10/16/17 1230 10/17/17 0335 10/18/17 0347  PHART 7.213* 7.331* 7.337*  PCO2ART 52.5* 38.3 41.2  PO2ART 83.0 104 72.8*    Liver Enzymes Recent Labs  Lab 10/16/17 0414 10/17/17 0355 10/20/17 0500  AST 898* 604* 106*  ALT 683* 480* 127*  ALKPHOS 115 128* 213*  BILITOT 0.9 1.1 1.0  ALBUMIN 1.4* 1.3* 1.4*    Cardiac Enzymes Recent Labs  Lab 10/15/17 0300 10/15/17 0759 10/16/17 0414  TROPONINI 0.07* 0.06* 0.04*    Glucose Recent Labs  Lab 10/18/17 2338 10/19/17 0628 10/19/17 1150 10/19/17 1723 10/20/17 0008 10/20/17 0907  GLUCAP 140* 148* 148* 159* 164* 158*    Imaging No results found.   STUDIES:  1/1 CT abdomen/pelvis: diffuse pneumatosis in cecum and ascending colon with  distension, bibasilar airspace opacities, aortic calcification, non-obstructing left kidney stone  CULTURES: 1/1 Blood >  1/1 urine >   ANTIBIOTICS: 12/31 zosyn >  1/4 Anidulafungin >   SIGNIFICANT EVENTS: 1/1 ex lap, R colectomy 1/3 ex lap, partial omentectomy, resection of ischemic ileum (130cm)  1/5 Ex lap, abdominal wall closure   EVENTS 10/14/2017 -admit 10/17/17 - Off pressors  10/18/17 - back from OR trip #3 - sp partial wound closure and ileostomy. Not on pressors. On fent gtt. Getting K. Making some urine   LINES/TUBES: 1/1 ETT >  1/1 R IJ CVL >   DISCUSSION: 72 y/o female with multiple medical problems admitted with ischemic bowel.    ASSESSMENT / PLAN:  PULMONARY A: Acute respiratory failure with hypoxemia Bilateral pleural effusions P:   Wean today on pressure support Full mechanical vent support VAP prevention Daily WUA/SBT Continue diuresis  CARDIOVASCULAR A:  Known CAD Hypertension Septic shock now resolved P:  Tele Monitor hemodynamics  RENAL A:   AKI P:   Monitor BMET and UOP Replace electrolytes as needed Continue diuresis with KCL supplementation  GASTROINTESTINAL A:   Shock liver, values improving Ischemic bowel P:   Nutrition per surgery  HEMATOLOGIC A:   Mild anemia without bleeding P:  Monitor for bleeding  INFECTIOUS A:   Ischemic colon/bowel with peritonitis s/p washout P:   Continue zosyn for now, consider stopping on 1/8  ENDOCRINE A:   DM2 with hyperglycemia  P:   SSI  NEUROLOGIC A:   Post op pain P:   RASS goal: -1 Stop fentanyl infusion Start precedex, prn fentanyl, prn versed per protocol  FAMILY  - Updates: updated daughter in law by phone on 1/7 at 10:37 AM  - Inter-disciplinary family meet or Palliative Care meeting due by:  day 7   My cc time 35 minutes  Roselie Awkward, MD [facility] [place] [facility] Pager: [phone] Cell: [phone] After 3pm or if no response, call [phone]   10/20/2017, 9:34  AM

## 2017-10-20 NOTE — Progress Notes (Signed)
[facility] NOTE   Pharmacy Consult for TPN & Zosyn Indication: extensive large and small bowel resection  Patient Measurements: Height: 5' 4" (162.6 cm) Weight: 206 lb 5.6 oz (93.6 kg) IBW/kg (Calculated) : 54.7 TPN AdjBW (KG): 60.8 Body mass index is 35.42 kg/m.  Insulin Requirements: Hx of DM. 7 Units SSI/24 hrs,   Current Nutrition: NPO, TPN at goal  IVF: discontinued 1/6  Central access: CVC from 1/1 TPN start date: 1/3 ID: day #7 zosyn, day #5 eraxis. WBC 23.1, SCr 2.57, Tmax24h 100.4  Antimicrobials this admission:  1/1 vanc x 1 0329 1/1 zosyn >> 1/3 Anidulafungin >>  Dose change: 1/2 ZEI> 2.25 q8 > back to [facility] for CrCl > 20   Microbiology results:  1/1 Ucx: ng-final 1/1 BCx: ng-final 1/1 MRSA PCR: neg  ASSESSMENT                                                                                                          HPI: 30 yoF to ED 1/1 with abd pain, pneumatosis R colon > surgery. Hx of CAD, DM, Diverticulosis, HPL, HTN, chronic pancreatitis noted CVA  Significant events:  1/1 OR exp lap. R colectomy, wound vac 1/3 return to OR- expl lap > sm bowel ischemia, resected ischemic ileum > VAC replaced. Begin TPN  1/5 OR Re-exploration of abdomen with end proximal ileostomy and closure of abdominal wall hernia  Today:   Glucose - DM on Victoza PTA, CBGs mostly at goal <150 mg/dL, 2 values slightly above at 159, 164  Electrolytes -lytes added to TPN 1/5 and Na improved; however, Phos elevated at 5.1 today. K 3.8 after replacement, Mag 2.2, CoCa 9.68 with Ca-Phos product of 49.  Renal - SCr 2.57, making urine on lasix, I/O -2.2L, NG 475 ml/24, drains 200 ml/24 hrs. Weight up, volume overloaded.   LFTs - AST/ALT elevated but coming down, Alk Phos elevated. Bilirubin wnl  TGs - were 349 on 1/1, Propofol d/c 1/2, Trig 366 (1/4), 168 (1/7)  Prealbumin - < 5  2nd critical illness/inflammation  NUTRITIONAL  GOALS                                                                                             RD recs: 10/17/17 Kcal 4461-9012 , protein 109-119 gm  Hold 20% lipid emulsion for first 7 days for ICU patients per ASPEN guidelines (Start date 10/17/17)  Clinimix 5/15 at a goal rate of 83 ml/hr to provide:1414 kcal and 100 gm protein.  111% kcal and 92 % protein.   PLAN  At 1800 today:  Change TPN formulation to electrolyte-free Clinimix 5/15 due to elevated phosphorus and continue at goal rate of 83 ml/hr This provides 1414 Kcal and 100 gm protein (111% kcal and 92% protein)  Hold 20% lipid emulsion for first 7 days for ICU patients per ASPEN guidelines (Start date 10/17/17)  Supplement KCl tonight with 10 mEq/46m x 4 (K+ below goal, removing KCl from TPN, on lasix).  TPN to contain standard multivitamins and trace elements.  Adjust SSI to moderate scale q4h for better control  TPN lab panels on Mondays & Thursdays.  BMET, Mag, Phos in AM.  F/u daily.  Continue zosyn 3.375 gm IV q8h infuse each dose over 4 hours. CrCl close to dose adjustment.  Continue eraxis 100 mg IV q24  AHershal Coria PharmD, BCPS Pager: [phone]/04/2018 7:51 AM

## 2017-10-20 NOTE — Progress Notes (Signed)
Date:  October 20, 2017 Chart reviewed for concurrent status and case management needs.  Will continue to follow patient progress.  Discharge Planning: following for needs  Expected discharge date: October 23 2017 Rhonda Davis, BSN, RN3, CCM   [phone]  

## 2017-10-20 NOTE — Progress Notes (Signed)
2 Days Post-Op   Subjective/Chief Complaint: Sedated on vent   Objective: Vital signs in last 24 hours: Temp:  [99.5 F (37.5 C)-100.4 F (38 C)] 100 F (37.8 C) (01/07 0600) Pulse Rate:  [45-92] 50 (01/07 0600) Resp:  [16-30] 20 (01/07 0600) BP: (132-184)/(39-118) 149/66 (01/07 0600) SpO2:  [96 %-100 %] 98 % (01/07 0822) FiO2 (%):  [30 %] 30 % (01/07 0822) Weight:  [93.6 kg (206 lb 5.6 oz)] 93.6 kg (206 lb 5.6 oz) (01/07 0200) Last BM Date: (PTA)  Intake/Output from previous day: 01/06 0701 - 01/07 0700 In: 2454.7 [I.V.:1984.7; NG/GT:20; IV Piggyback:450] Out: 4660 [Urine:3975; Emesis/NG output:475; Drains:200; Stool:10] Intake/Output this shift: No intake/output data recorded.  General appearance: sedated on vent Resp: clear to auscultation bilaterally and on vent Cardio: regular rate and rhythm and off pressors GI: soft, ostomy looks viable-no output yet. vac in place  Lab Results:  Recent Labs    10/19/17 0930 10/20/17 0500  WBC 20.2* 23.1*  HGB 9.4* 10.2*  HCT 27.4* 30.3*  PLT 116* 151   BMET Recent Labs    10/19/17 0500 10/20/17 0500  NA 132* 134*  K 3.6 3.8  CL 104 103  CO2 22 22  GLUCOSE 164* 176*  BUN 59* 65*  CREATININE 2.43* 2.57*  CALCIUM 7.1* 7.6*   PT/INR No results for input(s): LABPROT, INR in the last 72 hours. ABG Recent Labs    10/18/17 0347  PHART 7.337*  HCO3 21.3    Studies/Results: Dg Chest Port 1 View  Result Date: 10/19/2017 CLINICAL DATA:  Endotracheal tube EXAM: PORTABLE CHEST 1 VIEW COMPARISON:  10/18/2017 FINDINGS: Endotracheal tube terminates 3.5 cm above the carina. Cardiomegaly with mild interstitial edema. Small right and moderate left pleural effusions. Associated left lower lobe opacity, likely atelectasis. Enteric tube courses into the proximal stomach. Right IJ venous catheter terminates in the cavoatrial junction. IMPRESSION: Cardiomegaly with mild interstitial edema and bilateral pleural effusions, left greater  than right. Endotracheal tube terminates 3.5 cm above the carina. Additional support apparatus as above. Electronically Signed   By: Julian Hy M.D.   On: 10/19/2017 07:25    Anti-infectives: Anti-infectives (From admission, onward)   Start     Dose/Rate Route Frequency Ordered Stop   10/17/17 1200  anidulafungin (ERAXIS) 100 mg in sodium chloride 0.9 % 100 mL IVPB     100 mg 78 mL/hr over 100 Minutes Intravenous Every 24 hours 10/16/17 1110     10/16/17 1200  anidulafungin (ERAXIS) 200 mg in sodium chloride 0.9 % 200 mL IVPB     200 mg 78 mL/hr over 200 Minutes Intravenous  Once 10/16/17 1110 10/16/17 1659   10/16/17 0200  piperacillin-tazobactam (ZOSYN) IVPB 3.375 g     3.375 g 12.5 mL/hr over 240 Minutes Intravenous Every 8 hours 10/15/17 2030     10/15/17 1000  piperacillin-tazobactam (ZOSYN) IVPB 2.25 g  Status:  Discontinued     2.25 g 100 mL/hr over 30 Minutes Intravenous Every 8 hours 10/15/17 0823 10/15/17 2028   10/14/17 1000  piperacillin-tazobactam (ZOSYN) IVPB 3.375 g  Status:  Discontinued     3.375 g 12.5 mL/hr over 240 Minutes Intravenous Every 8 hours 10/14/17 0805 10/15/17 0823   10/14/17 0315  piperacillin-tazobactam (ZOSYN) IVPB 3.375 g     3.375 g 100 mL/hr over 30 Minutes Intravenous  Once 10/14/17 0302 10/14/17 0440   10/14/17 0315  vancomycin (VANCOCIN) IVPB 1000 mg/200 mL premix     1,000 mg 200 mL/hr over  60 Minutes Intravenous  Once 10/14/17 0303 10/14/17 0440      Assessment/Plan: s/p Procedure(s): RE-EXPLORATION OF ABDOMEN, ILEOSTOMY CREATION (N/A) Continue tpn for nutritional support  VDRF per CCM Critical care per CCM Continue abx No new recs  LOS: 6 days    TOTH III,Lamonica Trueba S 10/20/2017

## 2017-10-20 NOTE — Progress Notes (Signed)
Peripherally Inserted Central Catheter/Midline Placement  The IV Nurse has discussed with the patient and/or persons authorized to consent for the patient, the purpose of this procedure and the potential benefits and risks involved with this procedure.  The benefits include less needle sticks, lab draws from the catheter, and the patient may be discharged home with the catheter. Risks include, but not limited to, infection, bleeding, blood clot (thrombus formation), and puncture of an artery; nerve damage and irregular heartbeat and possibility to perform a PICC exchange if needed/ordered by physician.  Alternatives to this procedure were also discussed.  Bard Power PICC patient education guide, fact sheet on infection prevention and patient information card has been provided to patient /or left at bedside.    PICC/Midline Placement Documentation  PICC Triple Lumen 10/20/17 PICC Right Cephalic 35 cm 0 cm (Active)  Indication for Insertion or Continuance of Line Administration of hyperosmolar/irritating solutions (i.e. TPN, Vancomycin, etc.) 10/20/2017  7:27 PM  Exposed Catheter (cm) 0 cm 10/20/2017  7:27 PM  Site Assessment Dry;Clean;Intact 10/20/2017  7:27 PM  Lumen #1 Status Flushed;Saline locked;Blood return noted 10/20/2017  7:27 PM  Lumen #2 Status Flushed;Saline locked;Blood return noted 10/20/2017  7:27 PM  Lumen #3 Status Flushed;Saline locked;Blood return noted 10/20/2017  7:27 PM  Dressing Type Transparent 10/20/2017  7:27 PM  Dressing Status Clean;Dry;Intact;Antimicrobial disc in place 10/20/2017  7:27 PM  Dressing Change Due 10/27/17 10/20/2017  7:27 PM       Gordan Payment 10/20/2017, 7:28 PM

## 2017-10-20 NOTE — Progress Notes (Signed)
80 mL of fentanyl 2500 mcg in 0.9% NS left from 250 mL bag wasted in sink with Junie Panning, Therapist, sports.

## 2017-10-20 NOTE — Progress Notes (Signed)
RIJ central line removed line tip sent for culture Pressure held pt tolerated well vss nad cont on vent via ett

## 2017-10-21 ENCOUNTER — Inpatient Hospital Stay (HOSPITAL_COMMUNITY): Payer: Medicare Other

## 2017-10-21 LAB — PHOSPHORUS: PHOSPHORUS: 4.4 mg/dL (ref 2.5–4.6)

## 2017-10-21 LAB — GLUCOSE, CAPILLARY
GLUCOSE-CAPILLARY: 175 mg/dL — AB (ref 65–99)
GLUCOSE-CAPILLARY: 188 mg/dL — AB (ref 65–99)
GLUCOSE-CAPILLARY: 150 mg/dL — AB (ref 65–99)
Glucose-Capillary: 170 mg/dL — ABNORMAL HIGH (ref 65–99)
Glucose-Capillary: 179 mg/dL — ABNORMAL HIGH (ref 65–99)
Glucose-Capillary: 198 mg/dL — ABNORMAL HIGH (ref 65–99)

## 2017-10-21 LAB — BASIC METABOLIC PANEL
ANION GAP: 9 (ref 5–15)
BUN: 79 mg/dL — ABNORMAL HIGH (ref 6–20)
CO2: 24 mmol/L (ref 22–32)
Calcium: 7.4 mg/dL — ABNORMAL LOW (ref 8.9–10.3)
Chloride: 102 mmol/L (ref 101–111)
Creatinine, Ser: 2.55 mg/dL — ABNORMAL HIGH (ref 0.44–1.00)
GFR calc non Af Amer: 18 mL/min — ABNORMAL LOW (ref >60)
GFR, EST AFRICAN AMERICAN: 21 mL/min — AB (ref >60)
Glucose, Bld: 188 mg/dL — ABNORMAL HIGH (ref 65–99)
Potassium: 3 mmol/L — ABNORMAL LOW (ref 3.5–5.1)
SODIUM: 135 mmol/L (ref 135–145)

## 2017-10-21 LAB — MAGNESIUM: MAGNESIUM: 1.6 mg/dL — AB (ref 1.7–2.4)

## 2017-10-21 MED ORDER — POTASSIUM CHLORIDE 10 MEQ/50ML IV SOLN
10.0000 meq | INTRAVENOUS | Status: DC
  Filled 2017-10-21 (×3): qty 50

## 2017-10-21 MED ORDER — POTASSIUM CHLORIDE 10 MEQ/100ML IV SOLN
10.0000 meq | INTRAVENOUS | Status: DC
  Administered 2017-10-21: 10 meq via INTRAVENOUS
  Filled 2017-10-21: qty 100

## 2017-10-21 MED ORDER — TRACE MINERALS CR-CU-MN-SE-ZN 10-1000-500-60 MCG/ML IV SOLN
INTRAVENOUS | Status: AC
  Administered 2017-10-21: 18:00:00 via INTRAVENOUS
  Filled 2017-10-21 (×2): qty 1992

## 2017-10-21 MED ORDER — IPRATROPIUM-ALBUTEROL 0.5-2.5 (3) MG/3ML IN SOLN
3.0000 mL | Freq: Four times a day (QID) | RESPIRATORY_TRACT | Status: DC
  Administered 2017-10-21 – 2017-11-04 (×57): 3 mL via RESPIRATORY_TRACT
  Filled 2017-10-21 (×57): qty 3

## 2017-10-21 MED ORDER — POTASSIUM CHLORIDE 10 MEQ/50ML IV SOLN
10.0000 meq | INTRAVENOUS | Status: AC
  Administered 2017-10-21 (×3): 10 meq via INTRAVENOUS
  Filled 2017-10-21 (×3): qty 50

## 2017-10-21 MED ORDER — POTASSIUM CHLORIDE 10 MEQ/50ML IV SOLN
10.0000 meq | INTRAVENOUS | Status: AC
  Administered 2017-10-21 (×6): 10 meq via INTRAVENOUS
  Filled 2017-10-21 (×6): qty 50

## 2017-10-21 MED ORDER — FUROSEMIDE 10 MG/ML IJ SOLN
80.0000 mg | Freq: Two times a day (BID) | INTRAMUSCULAR | Status: DC
  Administered 2017-10-21 – 2017-10-24 (×7): 80 mg via INTRAVENOUS
  Filled 2017-10-21 (×7): qty 8

## 2017-10-21 MED ORDER — MAGNESIUM SULFATE 2 GM/50ML IV SOLN
2.0000 g | Freq: Once | INTRAVENOUS | Status: AC
  Administered 2017-10-21: 2 g via INTRAVENOUS
  Filled 2017-10-21: qty 50

## 2017-10-21 NOTE — Progress Notes (Signed)
PULMONARY / CRITICAL CARE MEDICINE   Name: [name] MRN: [id number] DOB: 1946-08-18    ADMISSION DATE:  10/14/2017 CONSULTATION DATE:  10/14/2017  REFERRING MD:  Marlou Starks  CHIEF COMPLAINT:  Abdominal pain  HISTORY OF PRESENT ILLNESS:   72 y/o female with multiple medical problems admitted on 1/1 with abdominal pain due to ischemic colon and ileum.   Past Medical History:  Diagnosis Date  . Anxiety   . Arthropathy, unspecified, site unspecified   . Blind loop syndrome   . Carotid artery disease ([place])    documented occlusion of left ICA  . Coronary artery disease    a. known 60-70% mid-LAD stenosis with caths in 1999, 2002, 2006, and 2012 showing stable anatomy b. low-risk NST in 10/2015  . Depression   . Diabetes insipidus ([place])   . Diabetes mellitus   . Diverticulosis   . Dizziness    chronic  . Dyslipidemia   . Dyspnea    with exertion  . Fatty liver   . GERD (gastroesophageal reflux disease)    hiatial hernia  . Hepatomegaly   . Hiatal hernia   . HTN (hypertension)   . Hyperlipidemia   . Hypertension   . Irritable bowel syndrome   . Melanosis coli   . Metatarsal fracture right  . Normal nuclear stress test    nml 01/03/10;06/19/07  . Other, mixed, or unspecified nondependent drug abuse, unspecified   . Pancreatitis, chronic ([place])   . Peripheral vascular disease ([place]) 02/20/10   s/p stent of left SFA  . Sleep apnea   . Stroke ([place])   . Thyroid nodule   . Unspecified essential hypertension   . Vitamin B12 deficiency      SUBJECTIVE:  Fever overnight Diuresed well, kidney function stable PICC placed CVL removed  VITAL SIGNS: BP (!) 165/51   Pulse 87   Temp 99.5 F (37.5 C)   Resp (!) 23   Ht 5\' 4"  (1.626 m)   Wt 93.6 kg (206 lb 5.6 oz)   SpO2 99%   BMI 35.42 kg/m   HEMODYNAMICS:    VENTILATOR SETTINGS: Vent Mode: PRVC FiO2 (%):  [30 %] 30 % Set Rate:  [18 bmp] 18 bmp Vt Set:  [440 mL] 440 mL PEEP:  [5 cmH20] 5 cmH20 Pressure Support:  [8  cmH20] 8 cmH20 Plateau Pressure:  [13 cmH20-21 cmH20] 13 cmH20  INTAKE / OUTPUT: I/O last 3 completed shifts: In: 2861 [I.V.:2181; Other:300; IV Piggyback:380] Out: 9460 [Urine:7450; Emesis/NG output:1675; Stool:335]  PHYSICAL EXAMINATION:  General:  In bed on vent HENT: NCAT ETT in place PULM: CTA B, vent supported breathing CV: RRR, no mgr GI: no bowel sounds, wound vac in place, ostomy pink MSK: normal bulk and tone Neuro: sedated on vent     LABS:  BMET Recent Labs  Lab 10/19/17 0500 10/20/17 0500 10/21/17 0411  NA 132* 134* 135  K 3.6 3.8 3.0*  CL 104 103 102  CO2 22 22 24   BUN 59* 65* 79*  CREATININE 2.43* 2.57* 2.55*  GLUCOSE 164* 176* 188*    Electrolytes Recent Labs  Lab 10/19/17 0500 10/20/17 0500 10/21/17 0411  CALCIUM 7.1* 7.6* 7.4*  MG 2.2 2.1 1.6*  PHOS 4.4 5.1* 4.4    CBC Recent Labs  Lab 10/18/17 0500 10/19/17 0930 10/20/17 0500  WBC 22.8* 20.2* 23.1*  HGB 9.4* 9.4* 10.2*  HCT 27.9* 27.4* 30.3*  PLT 102* 116* 151    Coag's No results for input(s): APTT, INR  in the last 168 hours.  Sepsis Markers Recent Labs  Lab 10/15/17 1839 10/16/17 1158 10/17/17 0817  LATICACIDVEN 4.0* 2.5* 1.2    ABG Recent Labs  Lab 10/16/17 1230 10/17/17 0335 10/18/17 0347  PHART 7.213* 7.331* 7.337*  PCO2ART 52.5* 38.3 41.2  PO2ART 83.0 104 72.8*    Liver Enzymes Recent Labs  Lab 10/16/17 0414 10/17/17 0355 10/20/17 0500  AST 898* 604* 106*  ALT 683* 480* 127*  ALKPHOS 115 128* 213*  BILITOT 0.9 1.1 1.0  ALBUMIN 1.4* 1.3* 1.4*    Cardiac Enzymes Recent Labs  Lab 10/15/17 0300 10/15/17 0759 10/16/17 0414  TROPONINI 0.07* 0.06* 0.04*    Glucose Recent Labs  Lab 10/20/17 0907 10/20/17 1159 10/20/17 1614 10/20/17 2036 10/21/17 0021 10/21/17 0346  GLUCAP 158* 173* 179* 149* 198* 179*    Imaging Dg Chest Port 1 View  Result Date: 10/21/2017 CLINICAL DATA:  Respiratory failure EXAM: PORTABLE CHEST 1 VIEW COMPARISON:   10/19/2017 FINDINGS: Cardiac shadow is stable. Aortic calcifications are again seen. The endotracheal tube and nasogastric catheter are stable. Right jugular central line is been removed and a right-sided PICC line placed in satisfactory position. Left-sided pleural effusion is identified but somewhat improved although this may be positional in nature. The right-sided effusion also appears improved. No new focal infiltrate is seen. IMPRESSION: Bilateral pleural effusions although improved from the prior exam. This may be positional in nature. Tubes and lines as described. Electronically Signed   By: Inez Catalina M.D.   On: 10/21/2017 07:19     STUDIES:  1/1 CT abdomen/pelvis: diffuse pneumatosis in cecum and ascending colon with distension, bibasilar airspace opacities, aortic calcification, non-obstructing left kidney stone  CULTURES: 1/1 Blood >  1/1 urine >  1/7 blood >  1/7 cath tip >   ANTIBIOTICS: 12/31 zosyn >  1/4 Anidulafungin >   SIGNIFICANT EVENTS: 1/1 ex lap, R colectomy 1/3 ex lap, partial omentectomy, resection of ischemic ileum (130cm)  1/5 Ex lap, abdominal wall closure   EVENTS 10/14/2017 -admit 10/17/17 - Off pressors  10/18/17 - back from OR trip #3 - sp partial wound closure and ileostomy. Not on pressors. On fent gtt. Getting K. Making some urine 1/8 Fever    LINES/TUBES: 1/1 ETT >  1/1 R IJ CVL > 1/8 1/7 PICC >   DISCUSSION: 73 y/o female with multiple medical problems admitted with ischemic bowel.    ASSESSMENT / PLAN:  PULMONARY A: Acute respiratory failure with hypoxemia Bilateral pleural effusions P:   Full mechanical vent support VAP prevention Daily WUA/SBT Continue diuresis  CARDIOVASCULAR A:  Known CAD Hypertension Septic shock now resolved P:  Tele Monitor hemodynamics  RENAL A:   AKI > stable P:   Monitor BMET and UOP Replace electrolytes as needed Continue to supplement K with diuresis  GASTROINTESTINAL A:   Shock  liver, values improving Ischemic bowel Profound protein calorie malnutrition P:   Continue TPN per surgery  HEMATOLOGIC A:   Mild anemia without bleeding P:  Monitor for bleeding  INFECTIOUS A:   Ischemic colon/bowel with peritonitis s/p washout New fever 1/8, WBC up, POD #2 from abdominal closure, lines are new, no infiltrate on CT P:   Consider CT abdomen/pelvis if still febrile 1/9 Continue zosyn F/U blood culture  ENDOCRINE A:   DM2 with hyperglycemia  P:   SSI  NEUROLOGIC A:   Post op pain P:   RASS goal: -1 Continue precedex, continue prn fentanyl, prn versed  FAMILY  -  Updates: updated daughter in law by phone on 1/7  - Inter-disciplinary family meet or Palliative Care meeting due by:  day 7  My cc time 33 minutes  Roselie Awkward, MD [facility] Pager: [phone] Cell: [phone] After 3pm or if no response, call [phone]   10/21/2017, 8:48 AM

## 2017-10-21 NOTE — Consult Note (Signed)
[facility] Nurse wound and ostomy follow up: I am assisted in the dressing and ostomy pouch change today by Bedside RN, Amy.  Her expertise and assistance is appreciated.   [facility] Nurse wound follow up Wound type:Midline full thickness (Surgical) Measurement: per Sunday Wound bed:50% yellow subcutaneous fat, 50% red. One suture visible in wound bed at midpoint. Drainage (amount, consistency, odor) small amount serous Periwound:intact.  Use skin barrier ring at distal border of wound to enhance drape seal.  Use drape to enhance seal of ileostomy pouch at 9 o'clock Dressing procedure/placement/frequency:NPWT 3x/week, Sunday, Tuesday Thursday, Saturday this week, M/W/F next week. Wound dressing removed and wound cleansed with NS.  One (1) piece of black foam used to obliterate dead space, piece of skin barrier ring placed at distal portion of wound.  Drape applied and dressing attached to 196mmHg negative pressure.  An immediate seal is achieved.  Next dressing change is due Thursday.    [facility] Nurse ostomy follow up Stoma type/location: RUQ ileostomy Stomal assessment/size: 1 and 1/8 inch round, slightly budded, os at center, red in center Peristomal assessment: intact Treatment options for stomal/peristomal skin: skin barrier ring and convex 1-piece pouch Output: green effluent Ostomy pouching: 1pc.convex with skin barrier ring  Education provided: None.  Patient remains intubated (albeit weaning), is premedicated and no family in room. Enrolled patient in [place] Start Discharge program: No    [facility] Nurse wound consult note Reason for Consult: Medical adhesive related skin injury in the inframammary area of the left breast. Wound shape is consistent with electrode Wound type: MARSI Pressure Injury POA: N/A Measurement:per Sunday Wound bed: Thin film dressings are intact with reepithelialization in progress. Periwound: Intact Dressing procedure/placement/frequency: Dressings left in place. To  be reassessed with next NPWT dressing change.  [facility] Nurse wound consult note Reason for Consult: Medical device related skin injury (MDRPI) to forehead, full thickness. Wound shape is consistent with pulse oximetry monitoring device. Wound type:MDRPI/Pressure Pressure Injury POA: No Measurement:Per Sunday  Wound UJW:JXBJ purple tissue with sloughing epidermis. Purple area is more firm than Sunday, expect eschar formation. Drainage (amount, consistency, odor) None Periwound: intact Dressing procedure/placement/frequency:Silicone foam dressing is peeled back for assessment and replaced.  [facility] nursing team will follow, and will remain available to this patient, the nursing, surgical and medical teams.   Thanks, Maudie Flakes, MSN, RN, Ruth, Arther Abbott  Pager# [phone]

## 2017-10-21 NOTE — Progress Notes (Signed)
3 Days Post-Op   Subjective/Chief Complaint: Intubated and sedated   Objective: Vital signs in last 24 hours: Temp:  [99.1 F (37.3 C)-101.1 F (38.4 C)] 99.1 F (37.3 C) (01/08 1300) Pulse Rate:  [63-94] 63 (01/08 1300) Resp:  [19-34] 19 (01/08 1300) BP: (76-179)/(33-70) 107/33 (01/08 1300) SpO2:  [97 %-100 %] 100 % (01/08 1345) FiO2 (%):  [30 %] 30 % (01/08 1345) Last BM Date: 10/21/17  Intake/Output from previous day: 01/07 0701 - 01/08 0700 In: 1892.2 [I.V.:1262.2; IV Piggyback:330] Out: 6700 [XBMWU:1324; Emesis/NG output:1450; Stool:325] Intake/Output this shift: Total I/O In: 1004.4 [I.V.:554.4; IV Piggyback:450] Out: 4010 [Urine:1250; Stool:200]  General appearance: intubated and sedated Resp: clear to auscultation bilaterally and on vent Cardio: regular rate and rhythm GI: soft, ostomy looks viable. vac in place  Lab Results:  Recent Labs    10/19/17 0930 10/20/17 0500  WBC 20.2* 23.1*  HGB 9.4* 10.2*  HCT 27.4* 30.3*  PLT 116* 151   BMET Recent Labs    10/20/17 0500 10/21/17 0411  NA 134* 135  K 3.8 3.0*  CL 103 102  CO2 22 24  GLUCOSE 176* 188*  BUN 65* 79*  CREATININE 2.57* 2.55*  CALCIUM 7.6* 7.4*   PT/INR No results for input(s): LABPROT, INR in the last 72 hours. ABG No results for input(s): PHART, HCO3 in the last 72 hours.  Invalid input(s): PCO2, PO2  Studies/Results: Dg Chest Port 1 View  Result Date: 10/21/2017 CLINICAL DATA:  Respiratory failure EXAM: PORTABLE CHEST 1 VIEW COMPARISON:  10/19/2017 FINDINGS: Cardiac shadow is stable. Aortic calcifications are again seen. The endotracheal tube and nasogastric catheter are stable. Right jugular central line is been removed and a right-sided PICC line placed in satisfactory position. Left-sided pleural effusion is identified but somewhat improved although this may be positional in nature. The right-sided effusion also appears improved. No new focal infiltrate is seen. IMPRESSION:  Bilateral pleural effusions although improved from the prior exam. This may be positional in nature. Tubes and lines as described. Electronically Signed   By: Inez Catalina M.D.   On: 10/21/2017 07:19    Anti-infectives: Anti-infectives (From admission, onward)   Start     Dose/Rate Route Frequency Ordered Stop   10/17/17 1200  anidulafungin (ERAXIS) 100 mg in sodium chloride 0.9 % 100 mL IVPB     100 mg 78 mL/hr over 100 Minutes Intravenous Every 24 hours 10/16/17 1110     10/16/17 1200  anidulafungin (ERAXIS) 200 mg in sodium chloride 0.9 % 200 mL IVPB     200 mg 78 mL/hr over 200 Minutes Intravenous  Once 10/16/17 1110 10/16/17 1659   10/16/17 0200  piperacillin-tazobactam (ZOSYN) IVPB 3.375 g     3.375 g 12.5 mL/hr over 240 Minutes Intravenous Every 8 hours 10/15/17 2030     10/15/17 1000  piperacillin-tazobactam (ZOSYN) IVPB 2.25 g  Status:  Discontinued     2.25 g 100 mL/hr over 30 Minutes Intravenous Every 8 hours 10/15/17 0823 10/15/17 2028   10/14/17 1000  piperacillin-tazobactam (ZOSYN) IVPB 3.375 g  Status:  Discontinued     3.375 g 12.5 mL/hr over 240 Minutes Intravenous Every 8 hours 10/14/17 0805 10/15/17 0823   10/14/17 0315  piperacillin-tazobactam (ZOSYN) IVPB 3.375 g     3.375 g 100 mL/hr over 30 Minutes Intravenous  Once 10/14/17 0302 10/14/17 0440   10/14/17 0315  vancomycin (VANCOCIN) IVPB 1000 mg/200 mL premix     1,000 mg 200 mL/hr over 60 Minutes Intravenous  Once 10/14/17 0303 10/14/17 0440      Assessment/Plan: s/p Procedure(s): RE-EXPLORATION OF ABDOMEN, ILEOSTOMY CREATION (N/A) Continue tpn for nutritional support. Consider starting some trickle feeds soon VDRF and Critical care per CCM Continue zosyn and eraxis  LOS: 7 days    TOTH III,PAUL S 10/21/2017

## 2017-10-21 NOTE — Progress Notes (Signed)
Nutrition Follow-up  DOCUMENTATION CODES:   Obesity unspecified  INTERVENTION:    TPN per pharmacy  Monitor for diet advancement/toleration  NUTRITION DIAGNOSIS:   Inadequate oral intake related to inability to eat as evidenced by NPO status.  Ongoing  GOAL:   Provide needs based on ASPEN/SCCM guidelines  Remains on TPN  MONITOR:   Vent status, Weight trends, I & O's, Labs  REASON FOR ASSESSMENT:   Ventilator    ASSESSMENT:   Pt with PMH of GERD, DM, HTN, PVD, HLD, HTN presents with abdominal pain found necrosis of proximal colon. Pt is s/p emergent ex lap with extended right colectomy and application of abdominal vacuum dressing on 10/14/17. Pt required intubation r/t post op respiratory failure.    SIGNIFICANT EVENTS: 1/1- exploratory laparotomy, right colectomy with wound vac placement 1/3- small bowel ischemia, resected ischemic ileum, wound vac replaced, TPN inititated 1/5- re-exploration of abdomen with end proximal ileostomy and closure of hernia  Pt discussed in rounds. It's possible she may need tracheostomy. PICC placed today for TPN. Currently receiving Clinimix E 5/15 @ 83 ml/hr providing 1414 kcal and 100 grams protein. This meets 111% of kcal needs and 92% protein needs. Will re-adjust once ILE begin. Weight shown to trend upwards 5 lb since 1/3, pt is +12,107 ml fluid balance.   Patient is currently intubated on ventilator support MV: 10.1 L/min Temp (24hrs), Avg:100.2 F (37.9 C), Min:99.3 F (37.4 C), Max:101.1 F (38.4 C) BP: 99/43 MAP: 62 Propofol: None  Medications reviewed and include: lasix, SSI, precedex, 2 g mag sulfate, IV abx, 10 mEq KCl  Labs reviewed: K 3.0 (L) Creatinine 2.55 (H) BUN 79 (H) Mg 1.6 (L)  Diet Order:  TPN (CLINIMIX) Adult without lytes TPN (CLINIMIX) Adult without lytes  EDUCATION NEEDS:   No education needs have been identified at this time  Skin:  Skin Assessment: Skin Integrity Issues: Skin Integrity  Issues:: Incisions Incisions: abdomen  Last BM:  10/21/17- ileostomy 325 ml  Height:   Ht Readings from Last 1 Encounters:  10/20/17 5\' 4"  (1.626 m)    Weight:   Wt Readings from Last 1 Encounters:  10/20/17 206 lb 5.6 oz (93.6 kg)    Ideal Body Weight:  54.5 kg  BMI:  Body mass index is 35.42 kg/m.  Estimated Nutritional Needs:   Kcal:  1003-1276 kcal (11-14 kcal/kg)  Protein:  109-119 grams (2-2.2 g/kg)  Fluid:  >1 L/day    Mariana Single RD, LDN Clinical Nutrition Pager # [phone]

## 2017-10-21 NOTE — Progress Notes (Signed)
[facility] for TPN  Indication: extensive large and small bowel resection  Patient Measurements: Height: _0  (162.6 cm) Weight: 206 lb 5.6 oz (93.6 kg) IBW/kg (Calculated) : 54.7 TPN AdjBW (KG): 60.8 Body mass index is 35.42 kg/m.  Insulin Requirements: Hx of DM. 14 Units SSI/24 hrs,   Current Nutrition: NPO, TPN at goal  IVF: discontinued 1/6  Central access: CVC from 1/1-1/7, PICC triple lumen 1/7- TPN start date: 1/3  ASSESSMENT                                                                                                          HPI: 15 yoF to ED 1/1 with abd pain, pneumatosis R colon > surgery. Hx of CAD, DM, Diverticulosis, HPL, HTN, chronic pancreatitis noted CVA  Significant events:  1/1 OR exp lap. R colectomy, wound vac 1/3 return to OR- expl lap > sm bowel ischemia, resected ischemic ileum > VAC replaced. Begin TPN  1/5 OR Re-exploration of abdomen with end proximal ileostomy and closure of abdominal wall hernia 1/7 CVC removed, cath tip cultured, blood cultured for persistent fever. PICC placed for TPN.  Today:   Glucose - DM on Victoza PTA, CBGs above goal despite adjusting SSI yesterday, will add insulin to TPN  Electrolytes -Phos improved but K+ 3.0, Mag 1.6 after removing lytes from TPN last night, CorrCa 9.48. MD ordered 4 runs of 10 mEq KCl this morning.  Renal - SCr 2.55, making urine on lasix, I/O -2.8L, NG 750 ml/24, no drain output recorded. Weight up, volume overloaded. BUN increasing, continue with current protein goal in setting of critical illness.  LFTs - per 1/7 labs, AST/ALT elevated but coming down, Alk Phos elevated. Bilirubin wnl  TGs - were 349 on 1/1, Propofol d/c 1/2, Trig 366 (1/4), 168 (1/7)  Prealbumin - < 5  remains low 2nd critical illness/inflammation  NUTRITIONAL GOALS                                                                                             RD recs: 10/17/17  Kcal 1696-7893 , protein 109-119 gm  Hold 20% lipid emulsion for first 7 days for ICU patients per ASPEN guidelines (Start date 10/17/17)  Clinimix 5/15 at a goal rate of 83 ml/hr to provide:1414 kcal and 100 gm protein.  111% kcal and 92 % protein.   PLAN  At 1800 today:  Continue electrolyte-free Clinimix 5/15 at 83 ml/hr. This provides 1414 Kcal and 100 gm protein (111% kcal and 92% protein)  Hold 20% lipid emulsion for first 7 days for ICU patients per ASPEN guidelines (Start date 10/17/17)  Add regular insulin to TPN so that patient will receive 10 units over the 24 hour bag.  TPN to contain standard multivitamins and trace elements.  Supplement K+ this evening with additional KCl runs 10 mEq/32m x 6 (K+ below goal, on lasix, and have removed KCl from TPN which was providing 60 mEq K+ over 24h).  May consider switch back to electrolyte-containing formulation as phosphorous allows.  Magnesium sulfate 2g IVPB x 1 today.  Continue SSI moderate scale q4h  TPN lab panels on Mondays & Thursdays.  BMET, Mag, Phos in AM.  F/u cultures obtained yesterday.  F/u daily.  AHershal Coria PharmD, BCPS Pager: [phone]/05/2018 7:30 AM

## 2017-10-21 NOTE — Progress Notes (Signed)
[facility] Progress Note Patient Name: [name] DOB: [birth year] MRN: [id number]   Date of Service  10/21/2017  HPI/Events of Note  K+ = 3.0 and Creatinine = 2.55. Patient being diuresed.   eICU Interventions  Will order: 1. Replace K+.     Intervention Category Major Interventions: Electrolyte abnormality - evaluation and management  Sommer,Steven Eugene 10/21/2017, 6:58 AM

## 2017-10-22 ENCOUNTER — Inpatient Hospital Stay (HOSPITAL_COMMUNITY): Payer: Medicare Other

## 2017-10-22 LAB — BASIC METABOLIC PANEL
Anion gap: 8 (ref 5–15)
Anion gap: 11 (ref 5–15)
BUN: 90 mg/dL — ABNORMAL HIGH (ref 6–20)
BUN: 84 mg/dL — AB (ref 6–20)
CALCIUM: 7.9 mg/dL — AB (ref 8.9–10.3)
CHLORIDE: 102 mmol/L (ref 101–111)
CHLORIDE: 101 mmol/L (ref 101–111)
CO2: 25 mmol/L (ref 22–32)
CO2: 23 mmol/L (ref 22–32)
CREATININE: 2.18 mg/dL — AB (ref 0.44–1.00)
Calcium: 7.5 mg/dL — ABNORMAL LOW (ref 8.9–10.3)
Creatinine, Ser: 2.36 mg/dL — ABNORMAL HIGH (ref 0.44–1.00)
GFR calc Af Amer: 25 mL/min — ABNORMAL LOW (ref >60)
GFR calc non Af Amer: 20 mL/min — ABNORMAL LOW (ref >60)
GFR calc non Af Amer: 22 mL/min — ABNORMAL LOW (ref >60)
GFR, EST AFRICAN AMERICAN: 23 mL/min — AB (ref >60)
GLUCOSE: 206 mg/dL — AB (ref 65–99)
Glucose, Bld: 193 mg/dL — ABNORMAL HIGH (ref 65–99)
POTASSIUM: 2.8 mmol/L — AB (ref 3.5–5.1)
Potassium: 3.1 mmol/L — ABNORMAL LOW (ref 3.5–5.1)
SODIUM: 135 mmol/L (ref 135–145)
Sodium: 135 mmol/L (ref 135–145)

## 2017-10-22 LAB — GLUCOSE, CAPILLARY
GLUCOSE-CAPILLARY: 167 mg/dL — AB (ref 65–99)
GLUCOSE-CAPILLARY: 169 mg/dL — AB (ref 65–99)
GLUCOSE-CAPILLARY: 174 mg/dL — AB (ref 65–99)
GLUCOSE-CAPILLARY: 174 mg/dL — AB (ref 65–99)
GLUCOSE-CAPILLARY: 210 mg/dL — AB (ref 65–99)
Glucose-Capillary: 175 mg/dL — ABNORMAL HIGH (ref 65–99)
Glucose-Capillary: 215 mg/dL — ABNORMAL HIGH (ref 65–99)

## 2017-10-22 LAB — PHOSPHORUS: PHOSPHORUS: 3.9 mg/dL (ref 2.5–4.6)

## 2017-10-22 LAB — HEPATIC FUNCTION PANEL
ALBUMIN: 1.3 g/dL — AB (ref 3.5–5.0)
ALT: 64 U/L — AB (ref 14–54)
AST: 55 U/L — ABNORMAL HIGH (ref 15–41)
Alkaline Phosphatase: 160 U/L — ABNORMAL HIGH (ref 38–126)
Bilirubin, Direct: 0.5 mg/dL (ref 0.1–0.5)
Indirect Bilirubin: 1 mg/dL — ABNORMAL HIGH (ref 0.3–0.9)
TOTAL PROTEIN: 4.8 g/dL — AB (ref 6.5–8.1)
Total Bilirubin: 1.5 mg/dL — ABNORMAL HIGH (ref 0.3–1.2)

## 2017-10-22 LAB — MAGNESIUM
MAGNESIUM: 1.7 mg/dL (ref 1.7–2.4)
Magnesium: 2.1 mg/dL (ref 1.7–2.4)

## 2017-10-22 MED ORDER — HEPARIN SODIUM (PORCINE) 5000 UNIT/ML IJ SOLN
5000.0000 [IU] | Freq: Three times a day (TID) | INTRAMUSCULAR | Status: AC
  Administered 2017-10-22 – 2017-10-27 (×15): 5000 [IU] via SUBCUTANEOUS
  Filled 2017-10-22 (×14): qty 1

## 2017-10-22 MED ORDER — CLINIMIX/DEXTROSE (5/15) 5 % IV SOLN
INTRAVENOUS | Status: AC
  Administered 2017-10-22: 18:00:00 via INTRAVENOUS
  Filled 2017-10-22 (×2): qty 1992

## 2017-10-22 MED ORDER — ORAL CARE MOUTH RINSE
15.0000 mL | Freq: Two times a day (BID) | OROMUCOSAL | Status: DC
  Administered 2017-10-22 – 2017-10-25 (×6): 15 mL via OROMUCOSAL

## 2017-10-22 MED ORDER — MAGNESIUM SULFATE IN D5W 1-5 GM/100ML-% IV SOLN
1.0000 g | Freq: Once | INTRAVENOUS | Status: AC
  Administered 2017-10-22: 1 g via INTRAVENOUS
  Filled 2017-10-22: qty 100

## 2017-10-22 MED ORDER — FENTANYL CITRATE (PF) 100 MCG/2ML IJ SOLN
12.5000 ug | INTRAMUSCULAR | Status: DC | PRN
  Administered 2017-10-22 – 2017-10-26 (×10): 25 ug via INTRAVENOUS
  Filled 2017-10-22 (×9): qty 2

## 2017-10-22 MED ORDER — LIP MEDEX EX OINT
TOPICAL_OINTMENT | CUTANEOUS | Status: AC
  Administered 2017-10-22: 15:00:00
  Filled 2017-10-22: qty 7

## 2017-10-22 MED ORDER — POTASSIUM CHLORIDE 10 MEQ/50ML IV SOLN
10.0000 meq | INTRAVENOUS | Status: AC
  Administered 2017-10-22 (×6): 10 meq via INTRAVENOUS
  Filled 2017-10-22 (×6): qty 50

## 2017-10-22 MED ORDER — CHLORHEXIDINE GLUCONATE 0.12 % MT SOLN
15.0000 mL | Freq: Two times a day (BID) | OROMUCOSAL | Status: DC
  Administered 2017-10-22 – 2017-11-06 (×28): 15 mL via OROMUCOSAL
  Filled 2017-10-22 (×20): qty 15

## 2017-10-22 MED ORDER — POTASSIUM CHLORIDE 10 MEQ/50ML IV SOLN
10.0000 meq | INTRAVENOUS | Status: AC
  Administered 2017-10-22 (×4): 10 meq via INTRAVENOUS
  Filled 2017-10-22 (×4): qty 50

## 2017-10-22 NOTE — Evaluation (Signed)
SLP Cancellation Note  Patient Details Name: [name] MRN: [id number] DOB: 12/13/1945   Cancelled treatment:       Reason Eval/Treat Not Completed: Other (comment);Medical issues which prohibited therapy(order for swallow eval received, pt remains on vent at this time, note plans to extubate today, will follow up next date given prolonged intubation, thanks for this order)   Macario Golds 10/22/2017, 11:12 AM

## 2017-10-22 NOTE — Progress Notes (Signed)
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 Active Problems:   Essential hypertension   GERD   Asthma   Fatty liver   Obesity   Type 2 diabetes mellitus (HCC)   Tobacco use   S/P exploratory laparotomy   Large bowel ischemia (HCC)   Ischemic colitis ([place])   Acute respiratory failure (HCC)   Acute post-operative pain   AKI (acute kidney injury) ([place])   Anemia   Pressure injury of skin   Ischemic bowel with septic shock  - 1/1 CT abdomen/pelvis: diffuse pneumatosis in cecum and ascending colon with distension - S/P 1/1 ex lap, R colectomy - S/P 1/3 ex lap, partial omentectomy, resection of ischemic ileum (130cm)  - S/P 1/5 Ex lap, abdominal wall closure  Hypokalemia - replenish per CCM AKI - stable Appreciate CCM's assistance   FEN: TNA, NPO VTE: SCD's, subcutaneous heparin ID: Zosyn 12/31>>    Anidulafungin 01/04>> Foley: yes and making urine,  Follow up: TBD  DISPO: appreciate CCM's assistance, continue TPN, potential extubation today so will hold on trickle feeds for now. Heparin subq per pharmacy. Will discuss findings of ulcers on tongue and face with MD.      LOS: 8 days    Subjective:  CC: ischemia bowel  Pt is alert and responsive. On vent. Will follow commands. Grasps abdomen when she coughs.   Objective: Vital signs in last 24 hours: Temp:  [98.6 F (37 C)-99.5 F (37.5 C)] 98.6 F (37 C) (01/09 0800) Pulse Rate:  [62-87] 73 (01/09 0800) Resp:  [14-25] 14 (01/09 0800) BP: (99-173)/(33-58) 115/35 (01/09 0800) SpO2:  [98 %-100 %] 100 % (01/09 0800) FiO2 (%):  [30 %] 30 % (01/09 0800) Weight:  [187 lb 6.3 oz (85 kg)] 187 lb 6.3 oz (85 kg) (01/09 0400) Last BM Date: 10/21/17  Intake/Output from previous day: 01/08 0701 - 01/09 0700 In: 3005.4 [I.V.:2205.4; IV Piggyback:800] Out: 3154 [Urine:4910; Emesis/NG output:350;  Stool:400] Intake/Output this shift: No intake/output data recorded.  PE: Gen:  Alert, on vent, follows commands HEENT: large ulcer to left side of forehead, tip of tongue appears to have a black ulcer on it (cannot see entire tongue), small ulcer of left cheek roughly 2.5cm Card:  RRR, no M/G/R heard Pulm:  On vent, CTA anteriorly, no wheezes or rales noted Abd: Soft, not distended, no BS appreciated, incision with wound vac in place, ostomy pink and small amount of green liquid stool in bag Extremities: 3+ pitting edema of BUE Neuro: follows commands Skin: warm and dry   Anti-infectives: Anti-infectives (From admission, onward)   Start     Dose/Rate Route Frequency Ordered Stop   10/17/17 1200  anidulafungin (ERAXIS) 100 mg in sodium chloride 0.9 % 100 mL IVPB     100 mg 78 mL/hr over 100 Minutes Intravenous Every 24 hours 10/16/17 1110     10/16/17 1200  anidulafungin (ERAXIS) 200 mg in sodium chloride 0.9 % 200 mL IVPB     200 mg 78 mL/hr over 200 Minutes Intravenous  Once 10/16/17 1110 10/16/17 1659   10/16/17 0200  piperacillin-tazobactam (ZOSYN) IVPB 3.375 g     3.375 g 12.5 mL/hr over 240 Minutes Intravenous Every 8 hours 10/15/17 2030     10/15/17 1000  piperacillin-tazobactam (ZOSYN) IVPB 2.25 g  Status:  Discontinued     2.25 g 100 mL/hr over 30 Minutes Intravenous Every 8 hours 10/15/17  6269 10/15/17 2028   10/14/17 1000  piperacillin-tazobactam (ZOSYN) IVPB 3.375 g  Status:  Discontinued     3.375 g 12.5 mL/hr over 240 Minutes Intravenous Every 8 hours 10/14/17 0805 10/15/17 0823   10/14/17 0315  piperacillin-tazobactam (ZOSYN) IVPB 3.375 g     3.375 g 100 mL/hr over 30 Minutes Intravenous  Once 10/14/17 0302 10/14/17 0440   10/14/17 0315  vancomycin (VANCOCIN) IVPB 1000 mg/200 mL premix     1,000 mg 200 mL/hr over 60 Minutes Intravenous  Once 10/14/17 0303 10/14/17 0440      Lab Results:  Recent Labs    10/19/17 0930 10/20/17 0500  WBC 20.2* 23.1*  HGB  9.4* 10.2*  HCT 27.4* 30.3*  PLT 116* 151   BMET Recent Labs    10/21/17 0411 10/22/17 0500  NA 135 135  K 3.0* 2.8*  CL 102 102  CO2 24 25  GLUCOSE 188* 193*  BUN 79* 90*  CREATININE 2.55* 2.36*  CALCIUM 7.4* 7.5*   PT/INR No results for input(s): LABPROT, INR in the last 72 hours. CMP     Component Value Date/Time   NA 135 10/22/2017 0500   K 2.8 (L) 10/22/2017 0500   CL 102 10/22/2017 0500   CO2 25 10/22/2017 0500   GLUCOSE 193 (H) 10/22/2017 0500   BUN 90 (H) 10/22/2017 0500   CREATININE 2.36 (H) 10/22/2017 0500   CALCIUM 7.5 (L) 10/22/2017 0500   PROT 4.8 (L) 10/22/2017 0500   ALBUMIN 1.3 (L) 10/22/2017 0500   AST 55 (H) 10/22/2017 0500   ALT 64 (H) 10/22/2017 0500   ALKPHOS 160 (H) 10/22/2017 0500   BILITOT 1.5 (H) 10/22/2017 0500   GFRNONAA 20 (L) 10/22/2017 0500   GFRAA 23 (L) 10/22/2017 0500   Lipase     Component Value Date/Time   LIPASE 17 10/14/2017 0329    Studies/Results: Dg Chest Port 1 View  Result Date: 10/22/2017 CLINICAL DATA:  Hypoxia EXAM: PORTABLE CHEST 1 VIEW COMPARISON:  October 21, 2017 FINDINGS: Endotracheal tube tip is 4.5 cm above the carina. Nasogastric tube tip and side port are below the diaphragm. Central catheter tip is in the superior vena cava near the cavoatrial junction, stable. No pneumothorax. There is a small left pleural effusion, stable. There is bibasilar atelectatic change, stable. Heart size is upper normal with pulmonary vascularity within normal limits. IMPRESSION: Tube and catheter positions as described without pneumothorax. Fairly small left pleural effusion, stable. Bibasilar atelectasis. No new opacity evident. Stable cardiac silhouette. Electronically Signed   By: Lowella Grip III M.D.   On: 10/22/2017 07:13   Dg Chest Port 1 View  Result Date: 10/21/2017 CLINICAL DATA:  Respiratory failure EXAM: PORTABLE CHEST 1 VIEW COMPARISON:  10/19/2017 FINDINGS: Cardiac shadow is stable. Aortic calcifications are again  seen. The endotracheal tube and nasogastric catheter are stable. Right jugular central line is been removed and a right-sided PICC line placed in satisfactory position. Left-sided pleural effusion is identified but somewhat improved although this may be positional in nature. The right-sided effusion also appears improved. No new focal infiltrate is seen. IMPRESSION: Bilateral pleural effusions although improved from the prior exam. This may be positional in nature. Tubes and lines as described. Electronically Signed   By: Inez Catalina M.D.   On: 10/21/2017 07:19      Kalman Drape , [facility] Surgery 10/22/2017, 8:47 AM Pager: [phone] Consults: [phone] Mon-Fri 7:00 am-4:30 pm Sat-Sun 7:00 am-11:30 am

## 2017-10-22 NOTE — Progress Notes (Signed)
PULMONARY / CRITICAL CARE MEDICINE   Name: [name] MRN: [id number] DOB: 10/11/1946    ADMISSION DATE:  10/14/2017 CONSULTATION DATE:  10/14/2017  REFERRING MD:  Marlou Starks  CHIEF COMPLAINT:  Abdominal pain  HISTORY OF PRESENT ILLNESS:   72 y/o female with multiple medical problems admitted on 1/1 with abdominal pain due to ischemic colon and ileum.   Past Medical History:  Diagnosis Date  . Anxiety   . Arthropathy, unspecified, site unspecified   . Blind loop syndrome   . Carotid artery disease ([place])    documented occlusion of left ICA  . Coronary artery disease    a. known 60-70% mid-LAD stenosis with caths in 1999, 2002, 2006, and 2012 showing stable anatomy b. low-risk NST in 10/2015  . Depression   . Diabetes insipidus ([place])   . Diabetes mellitus   . Diverticulosis   . Dizziness    chronic  . Dyslipidemia   . Dyspnea    with exertion  . Fatty liver   . GERD (gastroesophageal reflux disease)    hiatial hernia  . Hepatomegaly   . Hiatal hernia   . HTN (hypertension)   . Hyperlipidemia   . Hypertension   . Irritable bowel syndrome   . Melanosis coli   . Metatarsal fracture right  . Normal nuclear stress test    nml 01/03/10;06/19/07  . Other, mixed, or unspecified nondependent drug abuse, unspecified   . Pancreatitis, chronic ([place])   . Peripheral vascular disease ([place]) 02/20/10   s/p stent of left SFA  . Sleep apnea   . Stroke ([place])   . Thyroid nodule   . Unspecified essential hypertension   . Vitamin B12 deficiency      SUBJECTIVE:  Passing SBT  Continues to diurese well  VITAL SIGNS: BP (!) 115/35 (BP Location: Left Wrist)   Pulse 73   Temp 98.6 F (37 C)   Resp 14   Ht 5\' 4"  (1.626 m)   Wt 85 kg (187 lb 6.3 oz)   SpO2 100%   BMI 32.17 kg/m   HEMODYNAMICS:    VENTILATOR SETTINGS: Vent Mode: PSV FiO2 (%):  [30 %] 30 % Set Rate:  [18 bmp] 18 bmp Vt Set:  [440 mL] 440 mL PEEP:  [5 cmH20] 5 cmH20 Pressure Support:  [5 cmH20-8 cmH20] 5  cmH20 Plateau Pressure:  [15 cmH20-18 cmH20] 15 cmH20  INTAKE / OUTPUT: I/O last 3 completed shifts: In: 63 [I.V.:3291; Other:300; IV CNOBSJGGE:366] Out: 10360 [Urine:8135; Emesis/NG output:1500; Stool:725]  PHYSICAL EXAMINATION:  General:  In bed on vent HENT: ulceration over left forehead, ETT in place PULM: CTA B, vent supported breathing CV: RRR, no mgr GI: some bowel movements, wound vac in place, ostomy pink MSK: normal bulk and tone Neuro: awake on vent, follows commands       LABS:  BMET Recent Labs  Lab 10/20/17 0500 10/21/17 0411 10/22/17 0500  NA 134* 135 135  K 3.8 3.0* 2.8*  CL 103 102 102  CO2 22 24 25   BUN 65* 79* 90*  CREATININE 2.57* 2.55* 2.36*  GLUCOSE 176* 188* 193*    Electrolytes Recent Labs  Lab 10/20/17 0500 10/21/17 0411 10/22/17 0500  CALCIUM 7.6* 7.4* 7.5*  MG 2.1 1.6* 1.7  PHOS 5.1* 4.4 3.9    CBC Recent Labs  Lab 10/18/17 0500 10/19/17 0930 10/20/17 0500  WBC 22.8* 20.2* 23.1*  HGB 9.4* 9.4* 10.2*  HCT 27.9* 27.4* 30.3*  PLT 102* 116* 151  Coag's No results for input(s): APTT, INR in the last 168 hours.  Sepsis Markers Recent Labs  Lab 10/15/17 1839 10/16/17 1158 10/17/17 0817  LATICACIDVEN 4.0* 2.5* 1.2    ABG Recent Labs  Lab 10/16/17 1230 10/17/17 0335 10/18/17 0347  PHART 7.213* 7.331* 7.337*  PCO2ART 52.5* 38.3 41.2  PO2ART 83.0 104 72.8*    Liver Enzymes Recent Labs  Lab 10/17/17 0355 10/20/17 0500 10/22/17 0500  AST 604* 106* 55*  ALT 480* 127* 64*  ALKPHOS 128* 213* 160*  BILITOT 1.1 1.0 1.5*  ALBUMIN 1.3* 1.4* 1.3*    Cardiac Enzymes Recent Labs  Lab 10/16/17 0414  TROPONINI 0.04*    Glucose Recent Labs  Lab 10/21/17 1146 10/21/17 1637 10/21/17 2014 10/22/17 0015 10/22/17 0349 10/22/17 0833  GLUCAP 175* 188* 150* 175* 174* 210*    Imaging Dg Chest Port 1 View  Result Date: 10/22/2017 CLINICAL DATA:  Hypoxia EXAM: PORTABLE CHEST 1 VIEW COMPARISON:  October 21, 2017 FINDINGS: Endotracheal tube tip is 4.5 cm above the carina. Nasogastric tube tip and side port are below the diaphragm. Central catheter tip is in the superior vena cava near the cavoatrial junction, stable. No pneumothorax. There is a small left pleural effusion, stable. There is bibasilar atelectatic change, stable. Heart size is upper normal with pulmonary vascularity within normal limits. IMPRESSION: Tube and catheter positions as described without pneumothorax. Fairly small left pleural effusion, stable. Bibasilar atelectasis. No new opacity evident. Stable cardiac silhouette. Electronically Signed   By: Lowella Grip III M.D.   On: 10/22/2017 07:13     STUDIES:  1/1 CT abdomen/pelvis: diffuse pneumatosis in cecum and ascending colon with distension, bibasilar airspace opacities, aortic calcification, non-obstructing left kidney stone  CULTURES: 1/1 Blood >  1/1 urine >  1/7 blood >  1/7 cath tip >   ANTIBIOTICS: 12/31 zosyn >  1/4 Anidulafungin >   SIGNIFICANT EVENTS: 1/1 ex lap, R colectomy 1/3 ex lap, partial omentectomy, resection of ischemic ileum (130cm)  1/5 Ex lap, abdominal wall closure   EVENTS 10/14/2017 -admit 10/17/17 - Off pressors  10/18/17 - back from OR trip #3 - sp partial wound closure and ileostomy. Not on pressors. On fent gtt. Getting K. Making some urine 1/8 Fever    LINES/TUBES: 1/1 ETT > 1/9 1/1 R IJ CVL > 1/8 1/7 PICC >   DISCUSSION: 72 y/o female with multiple medical problems admitted with ischemic bowel.  Improving with diuresis week of 1/7.  ASSESSMENT / PLAN:  PULMONARY A: Acute respiratory failure with hypoxemia > improving Bilateral pleural effusions Anasarca, acute pulmonary edema P:   Extubate today Continue diuresis  CARDIOVASCULAR A:  Known CAD Hypertension Septic shock now resolved P:  Tele Monitor hemodynamics  RENAL A:   AKI > improving Hypokalemia, hypomagnesemia P:   Monitor BMET and UOP Replace  electrolytes as needed Continue lasix  Continue potassium replacement and magnesium replacement, check both again tonight at 1900  GASTROINTESTINAL A:   Shock liver, values improving Ischemic bowel Profound protein calorie malnutrition P:   Continue TPN per surgery Trickle feeds today  HEMATOLOGIC A:   Mild anemia without bleeding P:  Monitor for bleeding  INFECTIOUS A:   Ischemic colon/bowel with peritonitis s/p washout New fever 1/8, WBC up, POD #2 from abdominal closure, lines are new, no infiltrate on CT P:   Stop zosyn and anidulofungin after today's dosing  ENDOCRINE A:   DM2 with hyperglycemia  P:   SSI  NEUROLOGIC A:  Post op pain P:   Stop sedation protocol Prn fentanyl  FAMILY  - Updates: updated daughter in law by phone on 1/7  - Inter-disciplinary family meet or Palliative Care meeting due by:  day 7  My cc time 33 minutes  Roselie Awkward, MD [facility] Pager: [phone] Cell: [phone] After 3pm or if no response, call [phone]   10/22/2017, 10:02 AM

## 2017-10-22 NOTE — Progress Notes (Signed)
[facility] for TPN  Indication: extensive large and small bowel resection  Patient Measurements: Height: '5\' 4"'  (162.6 cm) Weight: 187 lb 6.3 oz (85 kg) IBW/kg (Calculated) : 54.7 TPN AdjBW (KG): 60.8 Body mass index is 32.17 kg/m.  Insulin Requirements: Hx of DM. 19 Units SSI/24 hrs,  Reg insulin 10 units  Current Nutrition: NPO, TPN at goal  IVF: discontinued 1/6  Central access: CVC from 1/1-1/7, PICC triple lumen 1/7- TPN start date: 1/3  ASSESSMENT                                                                                                          HPI: 52 yoF to ED 1/1 with abd pain, pneumatosis R colon > surgery. Hx of CAD, DM, Diverticulosis, HPL, HTN, chronic pancreatitis noted CVA  Significant events:  1/1 OR exp lap. R colectomy, wound vac 1/3 return to OR- expl lap > sm bowel ischemia, resected ischemic ileum > VAC replaced. Begin TPN  1/5 OR Re-exploration of abdomen with end proximal ileostomy and closure of abdominal wall hernia 1/7 CVC removed, cath tip cultured, blood cultured for persistent fever. PICC placed for TPN.  Today:   Glucose - DM on Victoza PTA, CBGs remain above goal (reg insulin now in TPN)  Electrolytes - K+ 2.8, Mag 1.7 (no e-lytes in TPN), CorrCa 9.66. KCL runs and Mag IV ordered by MD this am  Renal - SCr 2.36, making urine on lasix, I/O -2.5L, NG 350 ml/24, no drain output recorded. Weight up, volume overloaded. BUN increasing, continue with current protein goal in setting of critical illness.  LFTs - per 1/7 labs, AST/ALT elevated but coming down, Alk Phos elevated. Bilirubin now increased  TGs - were 349 on 1/1, Propofol d/c 1/2, Trig 366 (1/4), 168 (1/7)  Prealbumin - < 5  remains low 2nd critical illness/inflammation  NUTRITIONAL GOALS                                                                                             RD recs: 10/17/17 Kcal 7972-8206 , protein 109-119  gm  Hold 20% lipid emulsion for first 7 days for ICU patients per ASPEN guidelines (Start date 10/17/17)  Clinimix 5/15 at a goal rate of 83 ml/hr to provide:1414 kcal and 100 gm protein.  111% kcal and 92 % protein.   PLAN  At 1800 today:  Continue electrolyte-free Clinimix 5/15 at 83 ml/hr. This provides 1414 Kcal and 100 gm protein (111% kcal and 92% protein)  Hold 20% lipid emulsion for first 7 days for ICU patients per ASPEN guidelines (Start date 10/17/17). Add lipids 1/11  Increase regular insulin to TPN so that patient will receive 15 units over the 24 hour bag.  TPN to contain standard multivitamins and trace elements.  May consider switch back to electrolyte-containing formulation as phosphorous allows.  Continue SSI moderate scale q4h  TPN lab panels on Mondays & Thursdays.    F/u daily.  Doreene Eland, PharmD, BCPS.   Pager: [phone] 10/22/2017 9:11 AM

## 2017-10-22 NOTE — Progress Notes (Signed)
[facility] Progress Note Patient Name: [name] DOB: [birth year] MRN: [id number]   Date of Service  10/22/2017  HPI/Events of Note  K+ = 2.8, Mg++ = 1.7 and Creatinine = 2.36 - Patient being diuresed with Lasix.   eICU Interventions  Will replace K+ and Mg++.     Intervention Category Major Interventions: Electrolyte abnormality - evaluation and management  Morgen Ritacco [place] 10/22/2017, 6:02 AM

## 2017-10-22 NOTE — Progress Notes (Signed)
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  Patient has been on SCDs for VTE prophylaxis since surgery.  Platelets low postop and have improved to WNL on 1/7.  Hgb low at 10.2 but stable.  Surgery wants to start SQ heparin for VTE prophylaxis today.  SCr remains elevated, decreased slightly today.  Plan: Start Heparin 5000 units SQ q8h tonight. No dose adjustments, pharmacy will sign off at this time.   Hershal Coria, PharmD, BCPS Pager: [phone] 10/22/2017 2:53 PM

## 2017-10-23 ENCOUNTER — Telehealth: Payer: Self-pay | Admitting: *Deleted

## 2017-10-23 DIAGNOSIS — Z932 Ileostomy status: Secondary | ICD-10-CM

## 2017-10-23 LAB — COMPREHENSIVE METABOLIC PANEL
ALBUMIN: 1.5 g/dL — AB (ref 3.5–5.0)
ALT: 58 U/L — ABNORMAL HIGH (ref 14–54)
ANION GAP: 10 (ref 5–15)
AST: 46 U/L — ABNORMAL HIGH (ref 15–41)
Alkaline Phosphatase: 172 U/L — ABNORMAL HIGH (ref 38–126)
BUN: 83 mg/dL — ABNORMAL HIGH (ref 6–20)
CALCIUM: 8 mg/dL — AB (ref 8.9–10.3)
CHLORIDE: 102 mmol/L (ref 101–111)
CO2: 25 mmol/L (ref 22–32)
Creatinine, Ser: 2.01 mg/dL — ABNORMAL HIGH (ref 0.44–1.00)
GFR calc non Af Amer: 24 mL/min — ABNORMAL LOW (ref >60)
GFR, EST AFRICAN AMERICAN: 28 mL/min — AB (ref >60)
GLUCOSE: 186 mg/dL — AB (ref 65–99)
Potassium: 2.7 mmol/L — CL (ref 3.5–5.1)
SODIUM: 137 mmol/L (ref 135–145)
Total Bilirubin: 1.5 mg/dL — ABNORMAL HIGH (ref 0.3–1.2)
Total Protein: 5.7 g/dL — ABNORMAL LOW (ref 6.5–8.1)

## 2017-10-23 LAB — PHOSPHORUS: PHOSPHORUS: 3 mg/dL (ref 2.5–4.6)

## 2017-10-23 LAB — GLUCOSE, CAPILLARY
GLUCOSE-CAPILLARY: 172 mg/dL — AB (ref 65–99)
GLUCOSE-CAPILLARY: 169 mg/dL — AB (ref 65–99)
Glucose-Capillary: 187 mg/dL — ABNORMAL HIGH (ref 65–99)
Glucose-Capillary: 177 mg/dL — ABNORMAL HIGH (ref 65–99)
Glucose-Capillary: 181 mg/dL — ABNORMAL HIGH (ref 65–99)
Glucose-Capillary: 174 mg/dL — ABNORMAL HIGH (ref 65–99)

## 2017-10-23 LAB — CBC
HCT: 29.9 % — ABNORMAL LOW (ref 36.0–46.0)
HEMOGLOBIN: 10.4 g/dL — AB (ref 12.0–15.0)
MCH: 28.7 pg (ref 26.0–34.0)
MCHC: 34.8 g/dL (ref 30.0–36.0)
MCV: 82.6 fL (ref 78.0–100.0)
Platelets: 205 10*3/uL (ref 150–400)
RBC: 3.62 MIL/uL — AB (ref 3.87–5.11)
RDW: 16 % — AB (ref 11.5–15.5)
WBC: 18.5 10*3/uL — AB (ref 4.0–10.5)

## 2017-10-23 LAB — MAGNESIUM: Magnesium: 1.5 mg/dL — ABNORMAL LOW (ref 1.7–2.4)

## 2017-10-23 MED ORDER — LABETALOL HCL 5 MG/ML IV SOLN
10.0000 mg | INTRAVENOUS | Status: DC | PRN
  Administered 2017-10-23 – 2017-10-25 (×8): 10 mg via INTRAVENOUS
  Filled 2017-10-23 (×9): qty 4

## 2017-10-23 MED ORDER — POTASSIUM CHLORIDE 10 MEQ/50ML IV SOLN
10.0000 meq | INTRAVENOUS | Status: AC
  Administered 2017-10-23 (×6): 10 meq via INTRAVENOUS
  Filled 2017-10-23 (×6): qty 50

## 2017-10-23 MED ORDER — MAGNESIUM SULFATE 2 GM/50ML IV SOLN
2.0000 g | Freq: Once | INTRAVENOUS | Status: AC
  Administered 2017-10-23: 2 g via INTRAVENOUS
  Filled 2017-10-23: qty 50

## 2017-10-23 MED ORDER — POTASSIUM CHLORIDE 10 MEQ/50ML IV SOLN
10.0000 meq | INTRAVENOUS | Status: AC
  Administered 2017-10-23 (×4): 10 meq via INTRAVENOUS
  Filled 2017-10-23 (×4): qty 50

## 2017-10-23 MED ORDER — TRACE MINERALS CR-CU-MN-SE-ZN 10-1000-500-60 MCG/ML IV SOLN
INTRAVENOUS | Status: AC
  Administered 2017-10-23: 17:00:00 via INTRAVENOUS
  Filled 2017-10-23 (×2): qty 1992

## 2017-10-23 NOTE — Progress Notes (Signed)
5 Days Post-Op   Subjective/Chief Complaint: Extubated yesterday. Awake and alert but not very talkative   Objective: Vital signs in last 24 hours: Temp:  [97.9 F (36.6 C)-98.6 F (37 C)] 98.1 F (36.7 C) (01/10 0800) Pulse Rate:  [27-99] 31 (01/10 0800) Resp:  [13-21] 17 (01/10 0800) BP: (134-214)/(25-121) 178/48 (01/10 0800) SpO2:  [98 %-100 %] 100 % (01/10 0932) Last BM Date: 10/22/17(Ostomy)  Intake/Output from previous day: 01/09 0701 - 01/10 0700 In: 2638.3 [I.V.:2058.3; IV Piggyback:580] Out: 1610 [Urine:7100; Emesis/NG output:20; Stool:1000] Intake/Output this shift: No intake/output data recorded.  General appearance: alert and cooperative Resp: clear to auscultation bilaterally Cardio: regular rate and rhythm GI: soft, minimal tenderness. ostomy viable  Lab Results:  Recent Labs    10/23/17 0438  WBC 18.5*  HGB 10.4*  HCT 29.9*  PLT 205   BMET Recent Labs    10/22/17 2011 10/23/17 0438  NA 135 137  K 3.1* 2.7*  CL 101 102  CO2 23 25  GLUCOSE 206* 186*  BUN 84* 83*  CREATININE 2.18* 2.01*  CALCIUM 7.9* 8.0*   PT/INR No results for input(s): LABPROT, INR in the last 72 hours. ABG No results for input(s): PHART, HCO3 in the last 72 hours.  Invalid input(s): PCO2, PO2  Studies/Results: Dg Chest Port 1 View  Result Date: 10/22/2017 CLINICAL DATA:  Hypoxia EXAM: PORTABLE CHEST 1 VIEW COMPARISON:  October 21, 2017 FINDINGS: Endotracheal tube tip is 4.5 cm above the carina. Nasogastric tube tip and side port are below the diaphragm. Central catheter tip is in the superior vena cava near the cavoatrial junction, stable. No pneumothorax. There is a small left pleural effusion, stable. There is bibasilar atelectatic change, stable. Heart size is upper normal with pulmonary vascularity within normal limits. IMPRESSION: Tube and catheter positions as described without pneumothorax. Fairly small left pleural effusion, stable. Bibasilar atelectasis. No new  opacity evident. Stable cardiac silhouette. Electronically Signed   By: Lowella Grip III M.D.   On: 10/22/2017 07:13    Anti-infectives: Anti-infectives (From admission, onward)   Start     Dose/Rate Route Frequency Ordered Stop   10/17/17 1200  anidulafungin (ERAXIS) 100 mg in sodium chloride 0.9 % 100 mL IVPB     100 mg 78 mL/hr over 100 Minutes Intravenous Every 24 hours 10/16/17 1110 10/22/17 1423   10/16/17 1200  anidulafungin (ERAXIS) 200 mg in sodium chloride 0.9 % 200 mL IVPB     200 mg 78 mL/hr over 200 Minutes Intravenous  Once 10/16/17 1110 10/16/17 1659   10/16/17 0200  piperacillin-tazobactam (ZOSYN) IVPB 3.375 g     3.375 g 12.5 mL/hr over 240 Minutes Intravenous Every 8 hours 10/15/17 2030 10/22/17 2121   10/15/17 1000  piperacillin-tazobactam (ZOSYN) IVPB 2.25 g  Status:  Discontinued     2.25 g 100 mL/hr over 30 Minutes Intravenous Every 8 hours 10/15/17 0823 10/15/17 2028   10/14/17 1000  piperacillin-tazobactam (ZOSYN) IVPB 3.375 g  Status:  Discontinued     3.375 g 12.5 mL/hr over 240 Minutes Intravenous Every 8 hours 10/14/17 0805 10/15/17 0823   10/14/17 0315  piperacillin-tazobactam (ZOSYN) IVPB 3.375 g     3.375 g 100 mL/hr over 30 Minutes Intravenous  Once 10/14/17 0302 10/14/17 0440   10/14/17 0315  vancomycin (VANCOCIN) IVPB 1000 mg/200 mL premix     1,000 mg 200 mL/hr over 60 Minutes Intravenous  Once 10/14/17 0303 10/14/17 0440      Assessment/Plan: s/p Procedure(s): RE-EXPLORATION OF ABDOMEN,  ILEOSTOMY CREATION (N/A) Continue abx  Continue tpn for nutrition support Will consult plastics for wound on forehead PT/OT Consider enteral feeds soon  LOS: 9 days    TOTH III,Arelys Glassco S 10/23/2017

## 2017-10-23 NOTE — Progress Notes (Signed)
Nutrition Follow-up  DOCUMENTATION CODES:   Obesity unspecified  INTERVENTION:  - Continue Clinimix E 5/15 @ 83 mL/hr and add 20% ILE @ 20 mL/hr x12 hours tomorrow. - Will monitor for when pt may be able to begin transition to TF versus oral diet.  NUTRITION DIAGNOSIS:   Inadequate oral intake related to inability to eat as evidenced by NPO status. -ongoing  GOAL:   Patient will meet greater than or equal to 90% of their needs -met with TPN regimen  MONITOR:   Weight trends, Labs, Skin, Other (Comment)(TPN regimen)  ASSESSMENT:   Pt with PMH of GERD, DM, HTN, PVD, HLD, HTN presents with abdominal pain found necrosis of proximal colon. Pt is s/p emergent ex lap with extended right colectomy and application of abdominal vacuum dressing on 10/14/17. Pt required intubation r/t post op respiratory failure.   SIGNIFICANT EVENTS: 1/1- exploratory laparotomy, right colectomy with wound vac placement 1/3- small bowel ischemia, resected ischemic ileum, wound vac replaced, TPN inititated 1/5- re-exploration of abdomen with end proximal ileostomy and closure of hernia 1/9- extubated and OGT removed   1/10 Pt was extubated yesterday and estimated nutrition needs updated based on this. Weight -19 lbs/8.6 kg since 1/7. Current weight (187 lbs/85 kg) used in estimating needs as this weight is much closer to admission weight and in light of previously noted fluid gain. Pt with triple lumen PICC and is receiving Clinimix E 5/15 @ 83 mL/hr (ILE on hold until tomorrow) which is providing 100 grams of protein and 1414 kcal. If 20% ILE @ 20 mL/hr x12 hours added, this will provide an additional 480 kcal/day (1894 kcal/day total) which will meet re-estimated kcal need.   Dr. Ethlyn Gallery note states possible to start TF soon; will monitor for ability to do so.  Medications reviewed; 80 mg IV Lasix BID, sliding scale Novolog, 2 g IV Mg sulfate x1 run today, 40 mg IV Protonix BID, 10 mEq IV KCl x4 runs  today. Labs reviewed; CBGsL 172 and 178 mg/dL, K: 2.7 mmol/L, BUN: 83 mg/dL, creatinine: 2.01 mg/dL, Ca: 8 mg/dL, Mg: 1.5 mg/dL, LFTs elevated, GFR: 24.    1/8 - Pt discussed in rounds; she may need tracheostomy.  - PICC placed today for TPN.  - Currently receiving Clinimix E 5/15 @ 83 ml/hr providing 1414 kcal and 100 grams protein.  - This meets 111% of kcal needs and 92% protein needs.  - Will re-adjust once ILE begin.  - Weight shown to trend upwards 5 lb since 1/3, pt is +12,107 ml fluid balance.   Patient is currently intubated on ventilator support MV: 10.1 L/min Temp (24hrs), Avg:100.2 F (37.9 C), Min:99.3 F (37.4 C), Max:101.1 F (38.4 C) BP: 99/43 MAP: 62 Propofol: None     Diet Order:  TPN (CLINIMIX) Adult without lytes .TPN (CLINIMIX-E) Adult  EDUCATION NEEDS:   No education needs have been identified at this time  Skin:  Skin Assessment: Skin Integrity Issues: Skin Integrity Issues:: Stage II, Unstageable Stage II: back and L upper face Unstageable: unknown location Incisions: abdominal   Last BM:  1/10  Height:   Ht Readings from Last 1 Encounters:  10/21/17 '5\' 4"'  (1.626 m)    Weight:   Wt Readings from Last 1 Encounters:  10/22/17 187 lb 6.3 oz (85 kg)    Ideal Body Weight:  54.5 kg  BMI:  Body mass index is 32.17 kg/m.  Estimated Nutritional Needs:   Kcal:  1870-2040 (22-24 kcal/kg)  Protein:  93-110  grams (1.1-1.3 grams/kg)  Fluid:  >/= 1.8 L/day       Jarome Matin, MS, RD, LDN, [facility] Inpatient Clinical Dietitian Pager # [phone] After hours/weekend pager # [phone] -

## 2017-10-23 NOTE — Progress Notes (Signed)
[place] for TPN  Indication: extensive large and small bowel resection  Patient Measurements: Height: '5\' 4"'  (162.6 cm) Weight: 187 lb 6.3 oz (85 kg) IBW/kg (Calculated) : 54.7 TPN AdjBW (KG): 60.8 Body mass index is 32.17 kg/m.  Insulin Requirements: Hx of DM. 22 Units SSI/24 hrs,  Reg insulin 15 units  Current Nutrition: NPO, TPN at goal  IVF: discontinued 1/6  Central access: CVC from 1/1-1/7, PICC triple lumen 1/7- TPN start date: 1/3  ASSESSMENT                                                                                                          HPI: 51 yoF to ED 1/1 with abd pain, pneumatosis R colon > surgery. Hx of CAD, DM, Diverticulosis, HPL, HTN, chronic pancreatitis noted CVA  Significant events:  1/1 OR exp lap. R colectomy, wound vac 1/3 return to OR- expl lap > sm bowel ischemia, resected ischemic ileum > VAC replaced. Begin TPN  1/5 OR Re-exploration of abdomen with end proximal ileostomy and closure of abdominal wall hernia 1/7 CVC removed, cath tip cultured, blood cultured for persistent fever. PICC placed for TPN.  Today:   Glucose - DM on Victoza PTA, CBGs remain above goal (reg insulin now in TPN)  Electrolytes - K+ 2.7, Mag 1.5 (no e-lytes in TPN), CorrCa 10.  Renal - SCr 2.01, making urine on lasix, I/O -5.4L, NG 20 ml/24, no drain output recorded. Weight up, volume overloaded. BUN high, continue with current protein goal in setting of critical illness.  LFTs - AST/ALT elevated but coming down, Alk Phos elevated. Bilirubin now increased  TGs - were 349 on 1/1, Propofol d/c 1/2, Trig 366 (1/4), 168 (1/7)  Prealbumin - < 5  remains low 2nd critical illness/inflammation  NUTRITIONAL GOALS                                                                                             RD recs: 10/17/17 Kcal 5956-3875 , protein 109-119 gm  Hold 20% lipid emulsion for first 7 days for ICU patients per  ASPEN guidelines (Start date 10/17/17)  Clinimix 5/15 at a goal rate of 83 ml/hr to provide:1414 kcal and 100 gm protein.  111% kcal and 92 % protein.   PLAN   KCl 64mq x6 per MD Mag sulfate 2gm IV x 1  At 1800 today:  Change to Clinimix E 5/15 at 83 ml/hr. Now that Phos WNL This provides 1414 Kcal and 100 gm protein (111% kcal and 92% protein)  Hold 20% lipid emulsion for first 7 days for ICU patients per ASPEN guidelines (Start date 10/17/17). Add lipids 1/11  Increase regular insulin to TPN so that patient will receive 20 units over the 24 hour bag.  TPN to contain standard multivitamins and trace elements.  Continue SSI moderate scale q4h  Mag and phos with am BMet  TPN lab panels on Mondays & Thursdays.    F/u daily.  Dolly Rias RPh 10/23/2017, 8:28 AM Pager [phone]

## 2017-10-23 NOTE — Progress Notes (Signed)
SLP Cancellation Note  Patient Details Name: [name] MRN: [id number] DOB: 18-Aug-1946   Cancelled treatment:       Reason Eval/Treat Not Completed: Patient not medically ready. Pt on TPN s/p surgery for ischemic bowel. Prolonged intubation. Will follow for readiness.    Susette Seminara, Katherene Ponto 10/23/2017, 12:29 PM

## 2017-10-23 NOTE — Evaluation (Signed)
Physical Therapy Evaluation Patient Details Name: [name] MRN: [id number] DOB: June 02, 1946 Today's Date: 10/23/2017   History of Present Illness  72 yo female admitted 10/14/17 with worsening abdominal pain. S/P EXPLORATORY LAPAROTOMY, EXTENDED RIGHT COLECTOMY; APPLICATION OF ABDOMINAL VACUUM DRESSING , extubated 10/22/17  Clinical Impression  The patient is very weak. Noted left facial drooping, The left U/LE is slightly weaker but generally, the patient is very weak. Unable to assist sitting upright today. Will require extensive assist. The patient is also difficult to understand when saying a few words. No family present. Pt admitted with above diagnosis. Pt currently with functional limitations due to the deficits listed below (see PT Problem List).  Pt will benefit from skilled PT to increase their independence and safety with mobility to allow discharge to the venue listed below.       Follow Up Recommendations SNF    Equipment Recommendations  (TBA)    Recommendations for Other Services   OT    Precautions / Restrictions Precautions Precaution Comments: abd VAC,, very weak      Mobility  Bed Mobility               General bed mobility comments: placed bed in chair position with attempts to lean forward, the patient is unable to assist with trunk, tends to list to the right.  Transfers                 General transfer comment: NT  Ambulation/Gait                Stairs            Wheelchair Mobility    Modified Rankin (Stroke Patients Only)       Balance Overall balance assessment: Needs assistance     Sitting balance - Comments: unable to fully test in bed, did not attempt sitting at the bed edge( need 2 assist)                                     Pertinent Vitals/Pain Pain Assessment: Faces Faces Pain Scale: Hurts a little bit Pain Location: no indication of site Pain Descriptors / Indicators: Grimacing     Home Living Family/patient expects to be discharged to:: Private residence Living Arrangements: Children;Other relatives               Additional Comments: unsure at this time ,caregivers not available    Prior Function           Comments: unsure, came from home,     Hand Dominance        Extremity/Trunk Assessment   Upper Extremity Assessment Upper Extremity Assessment: LUE deficits/detail;RUE deficits/detail RUE Deficits / Details: able to hold arm wheen placed in flexion over head.  LUE Deficits / Details: drops arm when placed over head. gradually able to reach out for bed rail.    Lower Extremity Assessment Lower Extremity Assessment: RLE deficits/detail;LLE deficits/detail RLE Deficits / Details: noted to hold leg in bent knee and hip position in supine when placed, decreased Heel cord length( lacks to neutral) LLE Deficits / Details: does not hold the leg in bent knee and hip flexion after placed, weak dorsiflexion, noted decreased  heel cord length, lacks to neutral    Cervical / Trunk Assessment Cervical / Trunk Assessment: Other exceptions Cervical / Trunk Exceptions: unable to test in bed, patient unable to sit forward  in bed/chair position  Communication   Communication: Expressive difficulties(speech is difficult to understand. )  Cognition Arousal/Alertness: Awake/alert Behavior During Therapy: Flat affect Overall Cognitive Status: Difficult to assess Area of Impairment: Orientation                 Orientation Level: Time             General Comments: patient stated hospital very sluggish, difficult to understand other responses      General Comments      Exercises General Exercises - Upper Extremity Shoulder Flexion: AAROM;PROM;Right;Left;5 reps Shoulder ABduction: AAROM;Left;5 reps Elbow Flexion: AAROM;Both;5 reps General Exercises - Lower Extremity Ankle Circles/Pumps: PROM;Both;10 reps Heel Slides: PROM;Both;10  reps Hip ABduction/ADduction: AAROM;Both;10 reps   Assessment/Plan    PT Assessment Patient needs continued PT services  PT Problem List Decreased strength;Decreased activity tolerance;Decreased mobility;Decreased knowledge of precautions;Decreased safety awareness;Decreased knowledge of use of DME;Pain;Decreased balance;Cardiopulmonary status limiting activity;Decreased cognition       PT Treatment Interventions Functional mobility training;Therapeutic activities;Patient/family education;Therapeutic exercise;Balance training;Cognitive remediation    PT Goals (Current goals can be found in the Care Plan section)  Acute Rehab PT Goals PT Goal Formulation: Patient unable to participate in goal setting Time For Goal Achievement: 11/06/17 Potential to Achieve Goals: Fair    Frequency Min 2X/week   Barriers to discharge        Co-evaluation               AM-PAC PT "6 Clicks" Daily Activity  Outcome Measure Difficulty turning over in bed (including adjusting bedclothes, sheets and blankets)?: Unable Difficulty moving from lying on back to sitting on the side of the bed? : Unable Difficulty sitting down on and standing up from a chair with arms (e.g., wheelchair, bedside commode, etc,.)?: Unable Help needed moving to and from a bed to chair (including a wheelchair)?: Total Help needed walking in hospital room?: Total Help needed climbing 3-5 steps with a railing? : Total 6 Click Score: 6    End of Session   Activity Tolerance: Patient tolerated treatment well;Patient limited by fatigue Patient left: in bed;with bed alarm set Nurse Communication: Mobility status PT Visit Diagnosis: Muscle weakness (generalized) (M62.81);Difficulty in walking, not elsewhere classified (R26.2)    Time: 2992-4268 PT Time Calculation (min) (ACUTE ONLY): 34 min   Charges:   PT Evaluation $PT Eval Moderate Complexity: 1 Mod PT Treatments $Therapeutic Activity: 8-22 mins   PT G CodesTresa Strong PT [phone]   Claretha Cooper 10/23/2017, 1:36 PM

## 2017-10-23 NOTE — Progress Notes (Signed)
Date: October 23, 2017 Velva Harman, BSN, [place], [place] Chart and notes review for patient progress and needs. Will follow for case management and discharge needs. Next review date: [id number]

## 2017-10-23 NOTE — Telephone Encounter (Signed)
She had a right hemicolectomy removing the cecum - the piecemeal polypectomy sites were located in the cecum.  Cancel her colonoscopy in February. Change her recall colonoscopy to a 3 year interval in 04/2020.

## 2017-10-23 NOTE — Telephone Encounter (Signed)
Feb colon cancelled- PV 1-21 cancelled- I put in a recall colon for 04-2020 [phone] I attempted to call pt- got a voice mail that had no talking, no message so I did not leave a message  Will mail a letter   Lelan Pons PV

## 2017-10-23 NOTE — Telephone Encounter (Signed)
Dr Fuller Plan,  This pt is scheduled for a colon on Feb 4 at 11 am for a 6 month recall with you. She was admitted 10-14-17 for abd pain and has two exploratory laps since admission first 10-16-17 and the 2nd 1-5 -19.   The 2nd she had an ileostomy created.   Do you want to push out her 6 month recall colon and have her see you in the office prior to her next colon?  Please advise   Thanks , Lelan Pons

## 2017-10-23 NOTE — Progress Notes (Signed)
PULMONARY / CRITICAL CARE MEDICINE   Name: [name] MRN: [id number] DOB: [birth year]    ADMISSION DATE:  10/14/2017 CONSULTATION DATE:  10/14/2017  REFERRING MD:  Marlou Starks  CHIEF COMPLAINT:  Abdominal pain  HISTORY OF PRESENT ILLNESS:   72 y/o female with multiple medical problems admitted on 1/1 with abdominal pain due to ischemic colon and ileum.  SUBJECTIVE:  Extubated   VITAL SIGNS: BP (!) 178/48 (BP Location: Left Arm)   Pulse (!) 31   Temp 98.1 F (36.7 C)   Resp 17   Ht 5\' 4"  (1.626 m)   Wt 187 lb 6.3 oz (85 kg)   SpO2 100%   BMI 32.17 kg/m   INTAKE / OUTPUT:  Intake/Output Summary (Last 24 hours) at 10/23/2017 1120 Last data filed at 10/23/2017 0631 Gross per 24 hour  Intake 1989.21 ml  Output 8100 ml  Net -6110.79 ml     PHYSICAL EXAMINATION: General 72 year old female resting in bed no distress HENT: large healing wounds on forehead. No sig change. Was from pulse ox. Has duoderm on both cheeks due to tap injury from ETT Pulm: decreased bases.  Card RRR  Ext: still w/ anasarca improved.  Abd: wound vac intact. Ostomy unremarkable  Neuro: awake, bilateral weakness. Oriented 1-2  LABS:  BMET Recent Labs  Lab 10/22/17 0500 10/22/17 2011 10/23/17 0438  NA 135 135 137  K 2.8* 3.1* 2.7*  CL 102 101 102  CO2 25 23 25   BUN 90* 84* 83*  CREATININE 2.36* 2.18* 2.01*  GLUCOSE 193* 206* 186*    Electrolytes Recent Labs  Lab 10/21/17 0411 10/22/17 0500 10/22/17 2011 10/23/17 0438  CALCIUM 7.4* 7.5* 7.9* 8.0*  MG 1.6* 1.7 2.1 1.5*  PHOS 4.4 3.9  --  3.0    CBC Recent Labs  Lab 10/19/17 0930 10/20/17 0500 10/23/17 0438  WBC 20.2* 23.1* 18.5*  HGB 9.4* 10.2* 10.4*  HCT 27.4* 30.3* 29.9*  PLT 116* 151 205    Coag's No results for input(s): APTT, INR in the last 168 hours.  Sepsis Markers Recent Labs  Lab 10/16/17 1158 10/17/17 0817  LATICACIDVEN 2.5* 1.2    ABG Recent Labs  Lab 10/16/17 1230 10/17/17 0335 10/18/17 0347   PHART 7.213* 7.331* 7.337*  PCO2ART 52.5* 38.3 41.2  PO2ART 83.0 104 72.8*    Liver Enzymes Recent Labs  Lab 10/20/17 0500 10/22/17 0500 10/23/17 0438  AST 106* 55* 46*  ALT 127* 64* 58*  ALKPHOS 213* 160* 172*  BILITOT 1.0 1.5* 1.5*  ALBUMIN 1.4* 1.3* 1.5*    Cardiac Enzymes No results for input(s): TROPONINI, PROBNP in the last 168 hours.  Glucose Recent Labs  Lab 10/22/17 1303 10/22/17 1629 10/22/17 1959 10/22/17 2331 10/23/17 0334 10/23/17 0758  GLUCAP 167* 174* 215* 169* 172* 187*    Imaging No results found.   STUDIES:  1/1 CT abdomen/pelvis: diffuse pneumatosis in cecum and ascending colon with distension, bibasilar airspace opacities, aortic calcification, non-obstructing left kidney stone  CULTURES: 1/1 Blood > neg 1/1 urine > neg 1/7 blood >  1/7 cath tip >   ANTIBIOTICS: 12/31 zosyn > 1/9 1/4 Anidulafungin > 1/9  SIGNIFICANT EVENTS: 1/1 ex lap, R colectomy 1/3 ex lap, partial omentectomy, resection of ischemic ileum (130cm)  1/5 Ex lap, abdominal wall closure   EVENTS 10/14/2017 -admit 10/17/17 - Off pressors  10/18/17 - back from OR trip #3 - sp partial wound closure and ileostomy. Not on pressors. On fent gtt. Getting K.  Making some urine 1/8 Fever    LINES/TUBES: 1/1 ETT > 1/9 1/1 R IJ CVL > 1/8 1/7 PICC >   DISCUSSION: 72 y/o female with multiple medical problems admitted with ischemic bowel.  Improving with diuresis week of 1/7. Extubated   ASSESSMENT / PLAN:  Ischemic colon/bowel with peritonitis s/p washout  POD #3 from abdominal closure, lines are new, no infiltrate on CT Plan Trend fever and WBC curve (ABX and antifungals stopped) Wound care per surg (wound vac)  Shock liver, values improving Profound protein calorie malnutrition Plan Trend intermittent LFTs Cont tna  tubefeeds per surg  Acute respiratory failure with hypoxemia > improving Bilateral pleural effusions Anasarca, acute pulmonary edema -extubated  successfully 1/10  Plan Mobilize  Wean oxygen  pulm hygiene   Profound deconditioning  Plan PT consulted Mobilize   Known CAD & Hypertension Plan PRN labetolol   AKI > improving Hypokalemia & hypomagnesemia from on-going diuresis  Plan Cont lasix  Replace and recheck K & Mg in am   Mild anemia without bleeding Plan Trend cbc    DM2 with hyperglycemia  Plan ssi    FAMILY  - Updates: updated daughter in law by phone on 1/7  - Inter-disciplinary family meet or Palliative Care meeting due by:  day 7  We will s/o recommend medical service support    Erick Colace ACNP-BC [place] Pager # [phone] OR # [phone] if no answer   10/23/2017, 11:06 AM

## 2017-10-23 NOTE — Consult Note (Signed)
[place] Nurse wound follow up Wound type: midline wound routine VAC change Measurement: per Sunday Wound bed: 50% red, 50% yellow Drainage (amount, consistency, odor) scant serosanguinous Periwound: mild maceration at 2-3 o'clock Dressing procedure/placement/frequency: Old dressing removed, wound cleansed with NS. Drape applied to periwound. Skin barrier ring placed from 5-7 o'clock.    [place] Nurse ostomy follow up Stoma type/location: 1 and 1/8 inch round with os at center.  Red in center. Stomal assessment/size: See above Peristomal assessment: intact Treatment options for stomal/peristomal skin: Skin barrier ring, convex pouch Output: green/brown Ostomy pouching: 1pc.convex with skin barrier ring  Education provided: None.  While patient is no longer intubated, she is not yet conversive Enrolled patient in [place] Start Discharge program: No  I am assisted in wound care today by the bedside RN.    Will JAARS, CCS PA in to see and photographed midline wound/ostomy today.  Will to see again with Dr. Marlou Starks later today for guidance regarding the Unstageable wound on the forehead and the new Unstageable wound on the left cheek identified following extubation. Recommend consideration of Plastics Consult for these wounds.  Left hand IV infiltrate is improving.  Left buttocks wound has evolved into full thickness wound, is red, moist and granulating. Stage 3.  [place] nursing team will follow, and will remain available to this patient, the nursing, surgical and medical teams.   Thanks, Maudie Flakes, MSN, RN, [place], Arther Abbott  Pager# [phone]

## 2017-10-24 ENCOUNTER — Inpatient Hospital Stay (HOSPITAL_COMMUNITY): Payer: Medicare Other

## 2017-10-24 LAB — GLUCOSE, CAPILLARY
GLUCOSE-CAPILLARY: 143 mg/dL — AB (ref 65–99)
Glucose-Capillary: 162 mg/dL — ABNORMAL HIGH (ref 65–99)
Glucose-Capillary: 164 mg/dL — ABNORMAL HIGH (ref 65–99)
Glucose-Capillary: 186 mg/dL — ABNORMAL HIGH (ref 65–99)
Glucose-Capillary: 217 mg/dL — ABNORMAL HIGH (ref 65–99)
Glucose-Capillary: 139 mg/dL — ABNORMAL HIGH (ref 65–99)

## 2017-10-24 LAB — PHOSPHORUS: Phosphorus: 4.2 mg/dL (ref 2.5–4.6)

## 2017-10-24 LAB — CATH TIP CULTURE: CULTURE: NO GROWTH

## 2017-10-24 LAB — BLOOD GAS, ARTERIAL
Acid-Base Excess: 1.8 mmol/L (ref 0.0–2.0)
BICARBONATE: 27.7 mmol/L (ref 20.0–28.0)
Drawn by: 295031
FIO2: 100
O2 Saturation: 98.7 %
PATIENT TEMPERATURE: 98.6
PCO2 ART: 49.9 mmHg — AB (ref 32.0–48.0)
PEEP: 5 cmH2O
RATE: 14 resp/min
VT: 500 mL
pH, Arterial: 7.363 (ref 7.350–7.450)
pO2, Arterial: 415 mmHg — ABNORMAL HIGH (ref 83.0–108.0)

## 2017-10-24 LAB — BASIC METABOLIC PANEL
Anion gap: 8 (ref 5–15)
BUN: 80 mg/dL — ABNORMAL HIGH (ref 6–20)
CALCIUM: 8.4 mg/dL — AB (ref 8.9–10.3)
CO2: 29 mmol/L (ref 22–32)
CREATININE: 1.55 mg/dL — AB (ref 0.44–1.00)
Chloride: 104 mmol/L (ref 101–111)
GFR calc non Af Amer: 33 mL/min — ABNORMAL LOW (ref >60)
GFR, EST AFRICAN AMERICAN: 38 mL/min — AB (ref >60)
GLUCOSE: 165 mg/dL — AB (ref 65–99)
Potassium: 2.7 mmol/L — CL (ref 3.5–5.1)
Sodium: 141 mmol/L (ref 135–145)

## 2017-10-24 LAB — MAGNESIUM: Magnesium: 1.9 mg/dL (ref 1.7–2.4)

## 2017-10-24 MED ORDER — FENTANYL CITRATE (PF) 100 MCG/2ML IJ SOLN: 50.0000 ug | INTRAMUSCULAR | Status: DC | PRN

## 2017-10-24 MED ORDER — PANTOPRAZOLE SODIUM 40 MG IV SOLR
40.0000 mg | Freq: Every day | INTRAVENOUS | Status: DC
  Administered 2017-10-25 – 2017-10-31 (×7): 40 mg via INTRAVENOUS
  Filled 2017-10-24 (×7): qty 40

## 2017-10-24 MED ORDER — SODIUM CHLORIDE 0.9 % IV SOLN
1.0000 g | Freq: Two times a day (BID) | INTRAVENOUS | Status: DC
  Administered 2017-10-25 – 2017-10-29 (×9): 1 g via INTRAVENOUS
  Filled 2017-10-24 (×9): qty 1

## 2017-10-24 MED ORDER — SODIUM CHLORIDE 0.9 % IV SOLN
2.0000 g | Freq: Once | INTRAVENOUS | Status: AC
  Administered 2017-10-24: 2 g via INTRAVENOUS
  Filled 2017-10-24: qty 2

## 2017-10-24 MED ORDER — ORAL CARE MOUTH RINSE
15.0000 mL | OROMUCOSAL | Status: DC
  Administered 2017-10-24 – 2017-11-06 (×114): 15 mL via OROMUCOSAL

## 2017-10-24 MED ORDER — IOPAMIDOL (ISOVUE-370) INJECTION 76%
INTRAVENOUS | Status: AC
  Filled 2017-10-24: qty 100

## 2017-10-24 MED ORDER — FENTANYL CITRATE (PF) 100 MCG/2ML IJ SOLN
50.0000 ug | INTRAMUSCULAR | Status: DC | PRN
  Administered 2017-10-26: 50 ug via INTRAVENOUS

## 2017-10-24 MED ORDER — ROCURONIUM BROMIDE 50 MG/5ML IV SOLN
70.0000 mg | Freq: Once | INTRAVENOUS | Status: AC
  Administered 2017-10-24: 70 mg via INTRAVENOUS

## 2017-10-24 MED ORDER — ASPIRIN 81 MG PO CHEW
81.0000 mg | CHEWABLE_TABLET | Freq: Every day | ORAL | Status: DC
  Administered 2017-10-24 – 2017-10-27 (×4): 81 mg via ORAL
  Filled 2017-10-24 (×4): qty 1

## 2017-10-24 MED ORDER — PHENYLEPHRINE HCL-NACL 10-0.9 MG/250ML-% IV SOLN
INTRAVENOUS | Status: AC
  Administered 2017-10-24: 50 mg
  Filled 2017-10-24: qty 250

## 2017-10-24 MED ORDER — TRACE MINERALS CR-CU-MN-SE-ZN 10-1000-500-60 MCG/ML IV SOLN
INTRAVENOUS | Status: AC
  Administered 2017-10-24: 18:00:00 via INTRAVENOUS
  Filled 2017-10-24: qty 1992

## 2017-10-24 MED ORDER — ETOMIDATE 2 MG/ML IV SOLN
20.0000 mg | Freq: Once | INTRAVENOUS | Status: AC
  Administered 2017-10-24: 20 mg via INTRAVENOUS

## 2017-10-24 MED ORDER — POTASSIUM CHLORIDE 10 MEQ/50ML IV SOLN
10.0000 meq | INTRAVENOUS | Status: AC
  Administered 2017-10-24 (×6): 10 meq via INTRAVENOUS
  Filled 2017-10-24 (×6): qty 50

## 2017-10-24 MED ORDER — FENTANYL CITRATE (PF) 100 MCG/2ML IJ SOLN
INTRAMUSCULAR | Status: AC
  Administered 2017-10-24: 100 ug
  Filled 2017-10-24: qty 2

## 2017-10-24 MED ORDER — MIDAZOLAM HCL 2 MG/2ML IJ SOLN
INTRAMUSCULAR | Status: AC
  Administered 2017-10-24: 2 mg
  Filled 2017-10-24: qty 2

## 2017-10-24 MED ORDER — CHLORHEXIDINE GLUCONATE 0.12% ORAL RINSE (MEDLINE KIT)
15.0000 mL | Freq: Two times a day (BID) | OROMUCOSAL | Status: DC
  Administered 2017-10-24 – 2017-10-26 (×5): 15 mL via OROMUCOSAL

## 2017-10-24 MED ORDER — ASPIRIN 300 MG RE SUPP: 300.0000 mg | Freq: Every day | RECTAL | Status: DC

## 2017-10-24 MED ORDER — DEXMEDETOMIDINE HCL IN NACL 200 MCG/50ML IV SOLN
0.0000 ug/kg/h | INTRAVENOUS | Status: DC
  Administered 2017-10-24: 0.6 ug/kg/h via INTRAVENOUS
  Administered 2017-10-24: 0.4 ug/kg/h via INTRAVENOUS
  Administered 2017-10-25: 0.202 ug/kg/h via INTRAVENOUS
  Administered 2017-10-25 – 2017-10-26 (×2): 0.6 ug/kg/h via INTRAVENOUS
  Filled 2017-10-24 (×5): qty 50

## 2017-10-24 MED ORDER — NOREPINEPHRINE BITARTRATE 1 MG/ML IV SOLN
0.0000 ug/min | INTRAVENOUS | Status: DC
  Filled 2017-10-24 (×2): qty 4

## 2017-10-24 MED ORDER — MIDAZOLAM HCL 2 MG/2ML IJ SOLN: 1.0000 mg | INTRAMUSCULAR | Status: DC | PRN

## 2017-10-24 MED ORDER — FAMOTIDINE IN NACL 20-0.9 MG/50ML-% IV SOLN
20.0000 mg | Freq: Two times a day (BID) | INTRAVENOUS | Status: DC
  Administered 2017-10-24 – 2017-11-05 (×25): 20 mg via INTRAVENOUS
  Filled 2017-10-24 (×25): qty 50

## 2017-10-24 MED ORDER — BACITRACIN-NEOMYCIN-POLYMYXIN OINTMENT TUBE
TOPICAL_OINTMENT | Freq: Every day | CUTANEOUS | Status: DC
  Administered 2017-10-24 – 2017-10-26 (×3): 1 via TOPICAL
  Administered 2017-10-27: 10:00:00 via TOPICAL
  Administered 2017-10-28: 1 via TOPICAL
  Administered 2017-10-29: 11:00:00 via TOPICAL
  Administered 2017-10-30 – 2017-11-01 (×3): 1 via TOPICAL
  Administered 2017-11-02 – 2017-11-06 (×5): via TOPICAL
  Filled 2017-10-24 (×2): qty 14.17

## 2017-10-24 MED ORDER — MIDAZOLAM HCL 2 MG/2ML IJ SOLN
1.0000 mg | INTRAMUSCULAR | Status: DC | PRN
  Administered 2017-10-26 – 2017-10-30 (×7): 1 mg via INTRAVENOUS
  Filled 2017-10-24 (×7): qty 2

## 2017-10-24 MED ORDER — STROKE: EARLY STAGES OF RECOVERY BOOK
Freq: Once | Status: DC
  Filled 2017-10-24: qty 1

## 2017-10-24 MED ORDER — FAT EMULSION 20 % IV EMUL
240.0000 mL | INTRAVENOUS | Status: AC
  Administered 2017-10-24: 240 mL via INTRAVENOUS
  Filled 2017-10-24: qty 250

## 2017-10-24 NOTE — Consult Note (Signed)
   [facility] TeleNeurology Consult Services  Impression: Acute Stroke Given distribution of infarcts in watershed territories and her recent sepsis/ hypotension, suspect the etiology is poor cerebral perfusion pressure complicated by advanced vascular disease. Cardioembolic phenomena are also possible and w/u for this is also recommended.  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  Patient is not a tPA candidate due to recent infarcts on HCT, unclear LKW and recent major surgery. NIR is not recommended due to known Ica occlusion, multifocal stenotic disease of the MCA (no thrombus), unclear LKW, completed strokes on HCT. Risk of Intervening outweighs benefit.   Comments:    [facility] contacted: 1110 [facility] at bedside: 1118 NIHSS assessment time: 1118    Recommendations:   Supportive ICU care as is being done Normotension Antiplatelet (asa 81 if able to swallow or 300 PR) Tele Neurology consultation inpatient Discussed with ED MD  History of Present Illness   Patient is a 72 yo F with multiple medical comorbidities and vascular risk factors including known left carotid occlusion.  Admitted with ischemic colon, sepsis and s/p resection and ostomy this admission.  She has been awake but altered for several days s/p extubation.  Today, the staff noted a new left facial droop and stroke alert was activated.  HCT done and is negative for ICH, ASPECTS 10. MRI STAT done and shows multifocal infarcts, bilaterally in watershed territories. In retrospect, all strokes are visible on initial HCT.  Limited exam related to encephalopathy but antigravity strength throughout.   Exam  Limited exam due to encephalopathy Antigravity strength throughout extremities.   NIHSS score: 17 1A: Level of Consciousness - Arouses to minor stimulation 1B: Ask Month and Age - 0 Questions Right 1C: 'Blink Eyes' & 'Squeeze Hands' - Performs 1 Task 2: Test Horizontal  Extraocular Movements - Normal 3: Test Visual Fields - No Visual Loss 4: Test Facial Palsy - Minor paralysis (flat nasolabial fold, smile asymetry)5A: Test Left Arm Motor Drift - No Drift for 10 Seconds 5B: Test Right Arm Motor Drift - Drift, but doesn't hit bed 6A: Test Left Leg Motor Drift - No Effort Against Gravity 6B: Test Right Leg Motor Drift - No Effort Against Gravity7: Test Limb Ataxia - No Ataxia 8: Test Sensation - Normal; No sensory loss 9: Test Language/Aphasia - ute/Global Aphasia: No Usable Speech/Auditory Comprehensionute/Global Aphasia: No Usable Speech/Auditory Comprehension 10: Test Dysarthria - Mute/Anarthric 11: Test Extinction/Inattention - No abnormality   Medical Decision Making:  - Extensive number of diagnosis or management options are considered above.   - Extensive amount of complex data reviewed.   - High risk of complication and/or morbidity or mortality are associated with differential diagnostic considerations above.  - There may be Uncertain outcome and increased probability of prolonged functional impairment or high probability of severe prolonged functional impairment associated with some of these differential diagnosis.  Medical Data Reviewed:  1.Data reviewed include clinical labs, radiology,  Medical Tests;   2.Tests results discussed w/performing or interpreting physician;   3.Obtaining/reviewing old medical records;  4.Obtaining case history from another source;  5.Independent review of image, tracing or specimen.

## 2017-10-24 NOTE — Progress Notes (Signed)
[facility]  Called by radiology regarding the MRI brain that showed acute infarcts bilaterally, occluded left internal carotid.   Discussed with neurology, they will see patient. Tele stroke team evaluating as well   Roselie Awkward, MD [place] [facility] Pager: [phone] Cell: [phone] After 3pm or if no response, call [phone]

## 2017-10-24 NOTE — Progress Notes (Signed)
[place] for TPN  Indication: extensive large and small bowel resection  Patient Measurements: Height: '5\' 4"'  (162.6 cm) Weight: 187 lb 6.3 oz (85 kg) IBW/kg (Calculated) : 54.7 TPN AdjBW (KG): 60.8 Body mass index is 32.17 kg/m.  Insulin Requirements: Hx of DM. 18 Units SSI/24 hrs,  Reg insulin 20 units  Current Nutrition:  TPN at goal, starting clears today  IVF: discontinued 1/6  Central access: CVC from 1/1-1/7, PICC triple lumen 1/7- TPN start date: 1/3  ASSESSMENT                                                                                                          HPI: 16 yoF to ED 1/1 with abd pain, pneumatosis R colon > surgery. Hx of CAD, DM, Diverticulosis, HPL, HTN, chronic pancreatitis noted CVA  Significant events:  1/1 OR exp lap. R colectomy, wound vac 1/3 return to OR- expl lap > sm bowel ischemia, resected ischemic ileum > VAC replaced. Begin TPN  1/5 OR Re-exploration of abdomen with end proximal ileostomy and closure of abdominal wall hernia 1/7 CVC removed, cath tip cultured, blood cultured for persistent fever. PICC placed for TPN. 1/11 start clears, cont TPN for nutritional support  Today:   Glucose - DM on Victoza PTA, CBGs remain above goal (reg insulin now in TPN)  Electrolytes - K+ 2.7(dispite replacement yest)  Mag 1.9 , CorrCa WNL, phos rising but still WNL  Renal - SCr 1.55, making urine on lasix, I/O -2.9L, NG out. Weight up, volume overloaded. BUN high, continue with current protein goal in setting of critical illness.  LFTs - AST/ALT elevated but coming down, Alk Phos elevated. Bilirubin now increased  TGs - were 349 on 1/1, Propofol d/c 1/2, Trig 366 (1/4), 168 (1/7)  Prealbumin - < 5  remains low 2nd critical illness/inflammation  NUTRITIONAL GOALS                                                                                             RD recs: 10/17/17 Kcal 3582-5189 , protein  109-119 gm  Hold 20% lipid emulsion for first 7 days for ICU patients per ASPEN guidelines (Start date 10/17/17)  Clinimix 5/15 at a goal rate of 83 ml/hr to provide:1414 kcal and 100 gm protein.  111% kcal and 92 % protein.   PLAN   KCl 74mq x6  At 1800 today:  Continue Clinimix E 5/15 at 83 ml/hr. watch Phos  start 20% lipid emulsion at 20 mls/hr for 12 hours (held for first 7 days for ICU patients per ASPEN guidelines)  increase regular insulin in TPN so that patient will receive 25 units over the 24 hour bag.  TPN to contain standard multivitamins and trace elements.  Continue SSI moderate scale q4h  Mag and phos with am BMet  TPN lab panels on Mondays & Thursdays.    F/u daily.  Dolly Rias RPh 10/24/2017, 10:30 AM Pager [phone]

## 2017-10-24 NOTE — Progress Notes (Signed)
PULMONARY / CRITICAL CARE MEDICINE   Name: [name] MRN: [id number] DOB: May 08, 1946    ADMISSION DATE:  10/14/2017 CONSULTATION DATE:  10/14/2017  REFERRING MD:  Marlou Starks  CHIEF COMPLAINT:  Abdominal pain  HISTORY OF PRESENT ILLNESS:   72 y/o female with multiple medical problems admitted on 1/1 with abdominal pain due to ischemic colon and ileum.  SUBJECTIVE:  Extubated   VITAL SIGNS: BP (!) 198/72 (BP Location: Left Arm) Comment: Labetolol  Pulse (!) 107   Temp 98.1 F (36.7 C)   Resp 15   Ht 5\' 4"  (1.626 m)   Wt 187 lb 6.3 oz (85 kg)   SpO2 99%   BMI 32.17 kg/m   INTAKE / OUTPUT:  Intake/Output Summary (Last 24 hours) at 10/24/2017 1353 Last data filed at 10/24/2017 1011 Gross per 24 hour  Intake 2608.98 ml  Output 3425 ml  Net -816.02 ml     PHYSICAL EXAMINATION: General  72 year old white female. Some increased WOB weak cough HENT: [place]. Has pressure dressing on forehead, and left face. Large ulcerated necrotic area on anterior tongue.  Pulm: weak cough. Diffuse rhonchi Card: regular Abd: wound vac in place. Hypoactive Ext: mottled, diaphoretic. Good pulses Neuro: awake, left facial droop bilateral weakness.   LABS:  BMET Recent Labs  Lab 10/22/17 2011 10/23/17 0438 10/24/17 0555  NA 135 137 141  K 3.1* 2.7* 2.7*  CL 101 102 104  CO2 23 25 29   BUN 84* 83* 80*  CREATININE 2.18* 2.01* 1.55*  GLUCOSE 206* 186* 165*    Electrolytes Recent Labs  Lab 10/22/17 0500 10/22/17 2011 10/23/17 0438 10/24/17 0555  CALCIUM 7.5* 7.9* 8.0* 8.4*  MG 1.7 2.1 1.5* 1.9  PHOS 3.9  --  3.0 4.2    CBC Recent Labs  Lab 10/19/17 0930 10/20/17 0500 10/23/17 0438  WBC 20.2* 23.1* 18.5*  HGB 9.4* 10.2* 10.4*  HCT 27.4* 30.3* 29.9*  PLT 116* 151 205    Coag's No results for input(s): APTT, INR in the last 168 hours.  Sepsis Markers No results for input(s): LATICACIDVEN, PROCALCITON, O2SATVEN in the last 168 hours.  ABG Recent Labs  Lab 10/18/17 0347   PHART 7.337*  PCO2ART 41.2  PO2ART 72.8*    Liver Enzymes Recent Labs  Lab 10/20/17 0500 10/22/17 0500 10/23/17 0438  AST 106* 55* 46*  ALT 127* 64* 58*  ALKPHOS 213* 160* 172*  BILITOT 1.0 1.5* 1.5*  ALBUMIN 1.4* 1.3* 1.5*    Cardiac Enzymes No results for input(s): TROPONINI, PROBNP in the last 168 hours.  Glucose Recent Labs  Lab 10/23/17 1623 10/23/17 1935 10/23/17 2327 10/24/17 0318 10/24/17 0750 10/24/17 1048  GLUCAP 181* 174* 169* 162* 164* 186*    Imaging [facility] Wo Contrast  Result Date: 10/24/2017 CLINICAL DATA:  Left facial droop.  Stroke. EXAM: MRI HEAD WITHOUT CONTRAST MRA HEAD WITHOUT CONTRAST MRA NECK WITHOUT CONTRAST TECHNIQUE: Multiplanar, multiecho pulse sequences of the brain and surrounding structures were obtained without intravenous contrast. Angiographic images of the Circle of Willis were obtained using MRA technique without intravenous contrast. Angiographic images of the neck were obtained using MRA technique without intravenous contrast. Carotid stenosis measurements (when applicable) are obtained utilizing NASCET criteria, using the distal internal carotid diameter as the denominator. COMPARISON:  CT head 10/24/2017 FINDINGS: MRI HEAD FINDINGS Brain: Image quality degraded by motion. Acute infarcts bilaterally, left greater than right. The largest area of infarction is in the left parietal white matter. This area  shows well-circumscribed hypodensity on CT suggesting this may be subacute. Additional smaller areas of acute infarct in the left temporal lobe also noted to be hypodense on CT. Small area of acute infarct in the right frontal parietal white matter. Negative for hemorrhage. Negative for hydrocephalus. Negative for hemorrhage mass or midline shift. Vascular: Loss of flow void in the left internal carotid artery compatible with occlusion. Normal flow void right internal carotid artery and posterior circulation. Skull and upper cervical  spine: Negative Sinuses/Orbits: Paranasal sinuses clear.  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 Right internal carotid artery patent without stenosis. High-grade stenosis distal left M1 segment. Both anterior cerebral arteries patent. Right middle cerebral artery is patent with irregularity which may be due to motion Both vertebral arteries are patent. The basilar is patent. Posterior cerebral arteries patent bilaterally. 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 Right carotid artery is patent. Mild narrowing of the right carotid bifurcation. Both vertebral arteries are patent to the basilar. IMPRESSION: Acute cerebral infarction bilaterally left greater than right. Acute infarcts in the left parietal white matter and left temporal lobe show hypodensity on CT suggesting these may be subacute. Small area of acute infarct in the right frontal parietal white matter. 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 Given the acute infarcts, this may be acute. Severe stenosis left M1 Mild stenosis right carotid bifurcation. Image quality degraded by motion. These results were called by telephone at the time of interpretation on 10/24/2017 at 12:37 pm to Dr. Lake Bells, who verbally acknowledged these results. Electronically Signed   By: Franchot Gallo M.D.   On: 10/24/2017 12:38   [name] Neck Wo Contrast  Result Date: 10/24/2017 CLINICAL DATA:  Left facial droop.  Stroke. EXAM: MRI HEAD WITHOUT CONTRAST MRA HEAD WITHOUT CONTRAST MRA NECK WITHOUT CONTRAST TECHNIQUE: Multiplanar, multiecho pulse sequences of the brain and surrounding structures were obtained without intravenous contrast.  Angiographic images of the Circle of Willis were obtained using MRA technique without intravenous contrast. Angiographic images of the neck were obtained using MRA technique without intravenous contrast. Carotid stenosis measurements (when applicable) are obtained utilizing NASCET criteria, using the distal internal carotid diameter as the denominator. COMPARISON:  CT head 10/24/2017 FINDINGS: MRI HEAD FINDINGS Brain: Image quality degraded by motion. Acute infarcts bilaterally, left greater than right. The largest area of infarction is in the left parietal white matter. This area shows well-circumscribed hypodensity on CT suggesting this may be subacute. Additional smaller areas of acute infarct in the left temporal lobe also noted to be hypodense on CT. Small area of acute infarct in the right frontal parietal white matter. Negative for hemorrhage. Negative for hydrocephalus. Negative for hemorrhage mass or midline shift. Vascular: Loss of flow void in the left internal carotid artery compatible with occlusion. Normal flow void right internal carotid artery and posterior circulation. Skull and upper cervical spine: Negative Sinuses/Orbits: Paranasal sinuses clear.  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 Right internal carotid artery patent without stenosis. High-grade stenosis distal left M1 segment. Both anterior cerebral arteries patent. Right middle cerebral artery is patent with irregularity which may be due to motion Both  vertebral arteries are patent. The basilar is patent. Posterior cerebral arteries patent bilaterally. 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 Right carotid artery is patent. Mild narrowing of the right carotid bifurcation.  Both vertebral arteries are patent to the basilar. IMPRESSION: Acute cerebral infarction bilaterally left greater than right. Acute infarcts in the left parietal white matter and left temporal lobe show hypodensity on CT suggesting these may be subacute. Small area of acute infarct in the right frontal parietal white matter. 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 Given the acute infarcts, this may be acute. Severe stenosis left M1 Mild stenosis right carotid bifurcation. Image quality degraded by motion. These results were called by telephone at the time of interpretation on 10/24/2017 at 12:37 pm to Dr. Lake Bells, who verbally acknowledged these results. Electronically Signed   By: Franchot Gallo M.D.   On: 10/24/2017 12:38   Mr Brain Wo Contrast  Result Date: 10/24/2017 CLINICAL DATA:  Left facial droop.  Stroke. EXAM: MRI HEAD WITHOUT CONTRAST MRA HEAD WITHOUT CONTRAST MRA NECK WITHOUT CONTRAST TECHNIQUE: Multiplanar, multiecho pulse sequences of the brain and surrounding structures were obtained without intravenous contrast. Angiographic images of the Circle of Willis were obtained using MRA technique without intravenous contrast. Angiographic images of the neck were obtained using MRA technique without intravenous contrast. Carotid stenosis measurements (when applicable) are obtained utilizing NASCET criteria, using the distal internal carotid diameter as the denominator. COMPARISON:  CT head 10/24/2017 FINDINGS: MRI HEAD FINDINGS Brain: Image quality degraded by motion. Acute infarcts bilaterally, left greater than right. The largest area of infarction is in the left parietal white matter. This area shows well-circumscribed hypodensity on CT suggesting this may be subacute. Additional smaller areas of acute infarct in the left temporal lobe also noted to be hypodense on CT. Small area of acute infarct in the right frontal parietal  white matter. Negative for hemorrhage. Negative for hydrocephalus. Negative for hemorrhage mass or midline shift. Vascular: Loss of flow void in the left internal carotid artery compatible with occlusion. Normal flow void right internal carotid artery and posterior circulation. Skull and upper cervical spine: Negative Sinuses/Orbits: Paranasal sinuses clear.  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 Right internal carotid artery patent without stenosis. High-grade stenosis distal left M1 segment. Both anterior cerebral arteries patent. Right middle cerebral artery is patent with irregularity which may be due to motion Both vertebral arteries are patent. The basilar is patent. Posterior cerebral arteries patent bilaterally. 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 Right carotid artery is patent. Mild narrowing of the right carotid bifurcation. Both vertebral arteries are patent to the basilar. IMPRESSION: Acute cerebral infarction bilaterally left greater than right. Acute infarcts in the left parietal white matter and left temporal lobe show hypodensity on CT suggesting these may be subacute. Small area of acute infarct in the right frontal parietal white matter. 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 Given the acute infarcts, this may be acute. Severe stenosis left M1 Mild stenosis right carotid bifurcation. Image quality degraded by motion. These results were called by telephone at  the time of interpretation on 10/24/2017 at 12:37 pm to Dr. Lake Bells, who verbally acknowledged these results. Electronically Signed   By: Franchot Gallo M.D.   On: 10/24/2017 12:38   Ct Head Code  Stroke Wo Contrast`  Result Date: 10/24/2017 CLINICAL DATA:  Code stroke.  Left facial droop EXAM: CT HEAD WITHOUT CONTRAST TECHNIQUE: Contiguous axial images were obtained from the base of the skull through the vertex without intravenous contrast. COMPARISON:  None. FINDINGS: Brain: Ventricle size normal. Mild atrophy. Chronic infarct left parietal white matter. Probable chronic subinsular infarct on the left. Negative for acute infarct.  Negative for hemorrhage or mass. Vascular: Negative for hyperdense vessel.  Atherosclerotic disease. Skull: Negative Sinuses/Orbits: Negative sinuses.  Bilateral cataract removal. Other: None ASPECTS ([place] Stroke Program Early CT Score) - Ganglionic level infarction (caudate, lentiform nuclei, internal capsule, insula, M1-M3 cortex): 7 - Supraganglionic infarction (M4-M6 cortex): 3 Total score (0-10 with 10 being normal): 10 IMPRESSION: 1. No acute abnormality 2. ASPECTS is 10 3. These results were called by telephone at the time of interpretation on 10/24/2017 at 11:11 am to Dr. Simonne Maffucci , who verbally acknowledged these results. Electronically Signed   By: Franchot Gallo M.D.   On: 10/24/2017 11:11     STUDIES:  1/1 CT abdomen/pelvis: diffuse pneumatosis in cecum and ascending colon with distension, bibasilar airspace opacities, aortic calcification, non-obstructing left kidney stone 1/11 MRI brain: Acute cerebral infarction bilaterally left greater than right. Acute infarcts in the left parietal white matter and left temporal lobe show hypodensity on CT suggesting these may be subacute. Small area of acute infarct in the right frontal parietal white matter. Negative for hemorrhage. Occluded left common carotid artery and left internal carotid artery of indeterminate age. Given the acute infarcts, this may be acute. Severe stenosis left M1 Mild stenosis right carotid bifurcation.Image quality degraded by motion. CULTURES: 1/1 Blood > neg 1/1 urine >  neg 1/7 blood >  1/7 cath tip >  bc x2 1/11>>>  ANTIBIOTICS: 12/31 zosyn > 1/9 1/4 Anidulafungin > 1/9 1/11 meropenem >>>  SIGNIFICANT EVENTS: 1/1 ex lap, R colectomy 1/3 ex lap, partial omentectomy, resection of ischemic ileum (130cm)  1/5 Ex lap, abdominal wall closure   EVENTS 10/14/2017 -admit 10/17/17 - Off pressors  10/18/17 - back from OR trip #3 - sp partial wound closure and ileostomy. Not on pressors. On fent gtt. Getting K. Making some urine 1/8 Fever  1/11 acute stroke   LINES/TUBES: 1/1 ETT > 1/9 1/1 R IJ CVL > 1/8 1/7 PICC >  ett 1/11>>>  DISCUSSION: Now w/ subacute stroke. Neuro following. Intubated for airway protection. Spoke w/ stroke and surgical services. Will need trach.   ASSESSMENT / PLAN:   New acute CVA. Bilateral R>L. Left parietal and left temporal. Also small areas right frontal/parietal.  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  Plan Aggressive ICU care Normotension goal (have added levophed and antihypertensives to achieve) Aspirin 81 mg VT daily (cleared w/ surgery) May consider transfer to [facility]  Ineffective cough and airway protection.  Recurrent acute respiratory failure  Bilateral effusions Will likely need trach Plan Intubate for airway protection VAP protocol PAD protocol  Ischemic colon/bowel with peritonitis s/p washout  POD #3 from abdominal closure, lines are new, no infiltrate on CT Plan Trend fever and WBC curve (ABX and antifungals stopped) Wound care per surg (wound vac)  Possible recurrent sepsis.  Plan Blood culture now Empiric meropenem.  Shock liver, values improving Profound protein calorie malnutrition Plan F/u am LFTs  Profound deconditioning  Plan PT consult again after stabalizes  Known CAD & Hypertension Plan PRN labetolol    AKI > improving Hypokalemia from on-going diuresis  Plan Hold lasix today  Replace K Repeat chem in am  Mild anemia  without bleeding Plan Trend cbc    DM2 with hyperglycemia  Plan ssi    FAMILY  - Updates: updated daughter in law by phone on 1/7  - Inter-disciplinary family meet or Palliative Care meeting due by:  day 7  Ccm time 90 minutes.   Erick Colace ACNP-BC [place] Pager # [phone] OR # [phone] if no answer   10/24/2017, 1:53 PM

## 2017-10-24 NOTE — Progress Notes (Signed)
[facility]  Called emergently to bedside to evaluate for left sided facial droop.  On exam: Mouth with new left facial droop distinctly different than yesterday Can raise left arm well Can sense me touching on left  Has facial scar over left eye, though her exam is worrisomely different today despite this  Impression: Possible stroke  Plan: CT head Activate code stroke  Roselie Awkward, MD [place] [facility] Pager: [phone] Cell: [phone] After 3pm or if no response, call [phone]

## 2017-10-24 NOTE — Evaluation (Signed)
SLP Cancellation Note  Patient Details Name: [name] MRN: [id number] DOB: May 10, 1946   Cancelled treatment:       Reason Eval/Treat Not Completed: Other (comment);Medical issues which prohibited therapy(per RN Hildred Alamin, pt is about to to intubated, will continue efforts) and follow up next week. Thanks.    Macario Golds 10/24/2017, 2:04 PM

## 2017-10-24 NOTE — Progress Notes (Signed)
Notified MD McQuaid in regards to new left sided facial droop. Preparing patient for CT of Head without contrast. Code Stroke activated.

## 2017-10-24 NOTE — Progress Notes (Signed)
6 Days Post-Op   Subjective/Chief Complaint: No complaints   Objective: Vital signs in last 24 hours: Temp:  [98.1 F (36.7 C)-98.8 F (37.1 C)] 98.6 F (37 C) (01/11 0600) Pulse Rate:  [31-94] 87 (01/11 0600) Resp:  [12-22] 22 (01/11 0600) BP: (113-238)/(35-146) 149/67 (01/11 0600) SpO2:  [97 %-100 %] 100 % (01/11 0600) Last BM Date: 10/22/17(Ostomy)  Intake/Output from previous day: 01/10 0701 - 01/11 0700 In: 2210 [I.V.:2010; IV Piggyback:200] Out: 5175 [Urine:4850; Stool:325] Intake/Output this shift: No intake/output data recorded.  General appearance: alert and cooperative Resp: rhonchi bilaterally Cardio: regular rate and rhythm GI: soft, mild tenderness. ostomy viable with some output  Lab Results:  Recent Labs    10/23/17 0438  WBC 18.5*  HGB 10.4*  HCT 29.9*  PLT 205   BMET Recent Labs    10/23/17 0438 10/24/17 0555  NA 137 141  K 2.7* 2.7*  CL 102 104  CO2 25 29  GLUCOSE 186* 165*  BUN 83* 80*  CREATININE 2.01* 1.55*  CALCIUM 8.0* 8.4*   PT/INR No results for input(s): LABPROT, INR in the last 72 hours. ABG No results for input(s): PHART, HCO3 in the last 72 hours.  Invalid input(s): PCO2, PO2  Studies/Results: No results found.  Anti-infectives: Anti-infectives (From admission, onward)   Start     Dose/Rate Route Frequency Ordered Stop   10/17/17 1200  anidulafungin (ERAXIS) 100 mg in sodium chloride 0.9 % 100 mL IVPB     100 mg 78 mL/hr over 100 Minutes Intravenous Every 24 hours 10/16/17 1110 10/22/17 1423   10/16/17 1200  anidulafungin (ERAXIS) 200 mg in sodium chloride 0.9 % 200 mL IVPB     200 mg 78 mL/hr over 200 Minutes Intravenous  Once 10/16/17 1110 10/16/17 1659   10/16/17 0200  piperacillin-tazobactam (ZOSYN) IVPB 3.375 g     3.375 g 12.5 mL/hr over 240 Minutes Intravenous Every 8 hours 10/15/17 2030 10/22/17 2121   10/15/17 1000  piperacillin-tazobactam (ZOSYN) IVPB 2.25 g  Status:  Discontinued     2.25 g 100 mL/hr  over 30 Minutes Intravenous Every 8 hours 10/15/17 0823 10/15/17 2028   10/14/17 1000  piperacillin-tazobactam (ZOSYN) IVPB 3.375 g  Status:  Discontinued     3.375 g 12.5 mL/hr over 240 Minutes Intravenous Every 8 hours 10/14/17 0805 10/15/17 0823   10/14/17 0315  piperacillin-tazobactam (ZOSYN) IVPB 3.375 g     3.375 g 100 mL/hr over 30 Minutes Intravenous  Once 10/14/17 0302 10/14/17 0440   10/14/17 0315  vancomycin (VANCOCIN) IVPB 1000 mg/200 mL premix     1,000 mg 200 mL/hr over 60 Minutes Intravenous  Once 10/14/17 0303 10/14/17 0440      Assessment/Plan: s/p Procedure(s): RE-EXPLORATION OF ABDOMEN, ILEOSTOMY CREATION (N/A) Advance diet. Start clears today Continue tpn for nutrition support PT/OT Plastics consult for wound on forehead  LOS: 10 days    TOTH III,PAUL S 10/24/2017

## 2017-10-24 NOTE — Procedures (Signed)
Intubation Procedure Note [name] [id number] [birth year]  Procedure: Intubation Indications: Respiratory insufficiency  Procedure Details Consent: Risks of procedure as well as the alternatives and risks of each were explained to the (patient/caregiver).  Consent for procedure obtained. Time Out: Verified patient identification, verified procedure, site/side was marked, verified correct patient position, special equipment/implants available, medications/allergies/relevent history reviewed, required imaging and test results available.  Performed  Maximum sterile technique was used including antiseptics, cap, gloves, hand hygiene and mask.  MAC and 3    Evaluation Hemodynamic Status: BP stable throughout; O2 sats: stable throughout Patient's Current Condition: stable Complications: No apparent complications Patient did tolerate procedure well. Chest X-ray ordered to verify placement.  CXR: pending. Erick Colace ACNP-BC [place] Pager # [phone] OR # [phone] if no answer   Clementeen Graham 10/24/2017

## 2017-10-24 NOTE — Progress Notes (Signed)
Nutrition Follow-up  DOCUMENTATION CODES:   Obesity unspecified  INTERVENTION:  - Continue TPN per Pharmacy. - Encourage intakes of CLD. - Continue diet advancement as medically feasible.   NUTRITION DIAGNOSIS:   Inadequate oral intake related to inability to eat as evidenced by NPO status. -ongoing  GOAL:   Patient will meet greater than or equal to 90% of their needs -met with TPN regimen  MONITOR:   Weight trends, Labs, Skin, Other (Comment)(TPN regimen)  ASSESSMENT:   Pt with PMH of GERD, DM, HTN, PVD, HLD, HTN presents with abdominal pain found necrosis of proximal colon. Pt is s/p emergent ex lap with extended right colectomy and application of abdominal vacuum dressing on 10/14/17. Pt required intubation r/t post op respiratory failure.    SIGNIFICANT EVENTS: 1/1- exploratory laparotomy, right colectomy with wound vac placement 1/3- small bowel ischemia, resected ischemic ileum, wound vac replaced, TPN inititated 1/5- re-exploration of abdomen with end proximal ileostomy and closure of hernia 1/9- extubated and OGT removed 1/11- ILE added    1/11 Diet advanced from NPO to CLD today at 8:46 AM; no intakes yet. ILE added to TPN regimen today. Pt now receiving Clinimix E 5/15 @ 83 mL/hr with 20% ILE @ 20 mL/hr x12 to start tonight. This regimen will provide 100 grams of protein and 1894 kcal. This will meet 100% estimated nutrition needs. No new weight since 1/9.  Per Dr. Anastasia Pall note about 30 minutes ago, pt with L-sided facial droop which is different from yesterday; concern for possible stroke and plan for head CT and Code Stroke.   Hypomagnesemia resolved from yesterday, persistent hypokalemia with no improvement in serum K following Mg and K runs yesterday.   Medications reviewed; 80 mg IV Lasix BID, sliding scale Novolog, 10 mEq IV KCl x4 runs yesterday and x6 runs today. Labs reviewed; CBGs: 162, 164, and 186 mg/dL today, K: 2.7 mmol/L, BUN: 80 mg/dL,  creatinine: 1.55 mg/dL, Ca: 8.4 mg/dL, GFR: 33 mL/min.     1/10 - Pt was extubated yesterday and estimated nutrition needs updated based on this. - Weight -19 lbs/8.6 kg since 1/7.  - Current weight (187 lbs/85 kg) used in estimating needs as this weight is much closer to admission weight and in light of previously noted fluid gain.  - Pt with triple lumen PICC and is receiving Clinimix E 5/15 @ 83 mL/hr which is providing 100 grams of protein and 1414 kcal.  - Dr. Ethlyn Gallery note states possible to start TF soon; will monitor for ability to do so.  Medications; 2 g IV Mg sulfate x1 run today, 10 mEq IV KCl x4 runs today. Labs; K: 2.7 mmol/L, Mg: 1.5 mg/dL.    1/8 - Pt discussed in rounds; she may need tracheostomy.  - PICC placed today for TPN.  - Currently receiving Clinimix E 5/15 @ 83 ml/hr providing 1414 kcal and 100 grams protein.  - This meets 111% of kcal needs and 92% protein needs.  - Will re-adjust once ILE begin.  - Weight shown to trend upwards 5 lb since 1/3, pt is +12,107 ml fluid balance.  Patient is currently intubated on ventilator support MV:10.1L/min Temp (24hrs), Avg:100.2 F (37.9 C), Min:99.3 F (37.4 C), Max:101.1 F (38.4 C) BP: 99/43 MAP: 62 Propofol:None      Diet Order:  .TPN (CLINIMIX-E) Adult Diet clear liquid Room service appropriate? Yes; Fluid consistency: Thin TPN (CLINIMIX-E) Adult  EDUCATION NEEDS:   No education needs have been identified at this time  Skin:  Skin Assessment: Skin Integrity Issues: Skin Integrity Issues:: Stage II, Unstageable Stage II: back and L upper face Unstageable: unknown location Incisions: abdominal   Last BM:  1/11  Height:   Ht Readings from Last 1 Encounters:  10/21/17 _0  (1.626 m)    Weight:   Wt Readings from Last 1 Encounters:  10/22/17 187 lb 6.3 oz (85 kg)    Ideal Body Weight:  54.5 kg  BMI:  Body mass index is 32.17 kg/m.  Estimated Nutritional Needs:   Kcal:   1870-2040 (22-24 kcal/kg)  Protein:  93-110 grams (1.1-1.3 grams/kg)  Fluid:  >/= 1.8 L/day      Jarome Matin, MS, RD, LDN, [facility] Inpatient Clinical Dietitian Pager # [phone] After hours/weekend pager # [phone]

## 2017-10-24 NOTE — Progress Notes (Signed)
Pharmacy Antibiotic Note  [name] is a 72 y.o. female admitted on 10/14/2017 with ischemic bowel and received 9d broad-spectrum abx and 7d Eraxis. Patient now with possible recurrence of sepsis, and Pharmacy has been consulted for meropenem dosing. Repeat BCx performed today.  Plan:  Meropenem 2g IV x 1, then 1g IV q12 hr  F/u Bcx, clinical course  Height: 5\' 4"  (162.6 cm) Weight: 187 lb 6.3 oz (85 kg) IBW/kg (Calculated) : 54.7  Temp (24hrs), Avg:98.5 F (36.9 C), Min:98.1 F (36.7 C), Max:98.8 F (37.1 C)  Recent Labs  Lab 10/18/17 0500  10/19/17 0930 10/20/17 0500 10/21/17 0411 10/22/17 0500 10/22/17 2011 10/23/17 0438 10/24/17 0555  WBC 22.8*  --  20.2* 23.1*  --   --   --  18.5*  --   CREATININE 2.51*   < >  --  2.57* 2.55* 2.36* 2.18* 2.01* 1.55*   < > = values in this interval not displayed.    Estimated Creatinine Clearance: 35.6 mL/min (A) (by C-G formula based on SCr of 1.55 mg/dL (H)).    Allergies  Allergen Reactions  . Codeine     Altered mental state   . Iodinated Diagnostic Agents Rash    Antimicrobials this admission:  1/1 vanc x 1 0329 1/1 zosyn >> 1/9 1/3 Anidulafungin >>1/9 1/11 Meropenem >>   Dose change: 1/2 ZEI> 2.25 q8 > back to [facility] for CrCl > 20  Microbiology results:  1/1 Ucx: ng-final 1/1 BCx: ng-final 1/1 MRSA PCR: neg 1/7 Cath tip cx: 1/7 BCx: NGTD  Thank you for allowing pharmacy to be a part of this patient's care.  Reuel Boom, PharmD, BCPS Pager: [phone] 10/24/2017, 3:52 PM

## 2017-10-24 NOTE — Progress Notes (Signed)
CRITICAL VALUE ALERT  Critical Value:  K+ 2.7  Date & Time Notied:  10/24/17 @0703   Provider Notified:  Orders Received/Actions taken: Pharmacy addressed potassium and has ordered K runs

## 2017-10-24 NOTE — Consult Note (Signed)
Neurology Consultation Reason for Consult: Left facial droop Referring Physician: Mcquaid, B  CC: Left facial droop  History is obtained from: Patient, family  HPI: [name] is a 72 y.o. female with a history of ICA occlusion of the left who was hospitalized due to ischemic bowel.  Today, it was noted that she had worsening left facial droop.  The son states that he noticed asymmetry of her face earlier in the hospitalization.  Today, there appeared to be worsening of it, however, and therefore code stroke was activated.  Tele-neurology evaluated the patient has stat MRI/MRA was obtained.  This shows bilateral infarcts left > right.  She has a left ICA occlusion as well as a left M1 stenosis.   LKW: Unclear, this morning versus on admission tpa given?: no, recent surgery   ROS:  Unable to obtain due to severe dysarthria  Past Medical History:  Diagnosis Date  . Anxiety   . Arthropathy, unspecified, site unspecified   . Blind loop syndrome   . Carotid artery disease ([place])    documented occlusion of left ICA  . Coronary artery disease    a. known 60-70% mid-LAD stenosis with caths in 1999, 2002, 2006, and 2012 showing stable anatomy b. low-risk NST in 10/2015  . Depression   . Diabetes insipidus ([place])   . Diabetes mellitus   . Diverticulosis   . Dizziness    chronic  . Dyslipidemia   . Dyspnea    with exertion  . Fatty liver   . GERD (gastroesophageal reflux disease)    hiatial hernia  . Hepatomegaly   . Hiatal hernia   . HTN (hypertension)   . Hyperlipidemia   . Hypertension   . Irritable bowel syndrome   . Melanosis coli   . Metatarsal fracture right  . Normal nuclear stress test    nml 01/03/10;06/19/07  . Other, mixed, or unspecified nondependent drug abuse, unspecified   . Pancreatitis, chronic ([place])   . Peripheral vascular disease ([place]) 02/20/10   s/p stent of left SFA  . Sleep apnea   . Stroke ([place])   . Thyroid nodule   . Unspecified essential  hypertension   . Vitamin B12 deficiency      Family History  Problem Relation Age of Onset  . Hypertension Mother   . Osteoarthritis Mother   . Pulmonary embolism Mother   . Hypertension Father        aneurysm  . Stroke Father   . Stroke Unknown   . Colon cancer Paternal Grandfather      Social History:  reports that she has been smoking cigarettes.  She has a 50.00 pack-year smoking history. she has never used smokeless tobacco. She reports that she does not drink alcohol or use drugs.   Exam: Current vital signs: BP (!) 126/53   Pulse 93   Temp 98.1 F (36.7 C)   Resp 19   Ht 5\' 4"  (1.626 m)   Wt 85 kg (187 lb 6.3 oz)   SpO2 100%   BMI 32.17 kg/m  Vital signs in last 24 hours: Temp:  [98.1 F (36.7 C)-98.8 F (37.1 C)] 98.1 F (36.7 C) (01/11 1000) Pulse Rate:  [32-107] 93 (01/11 1900) Resp:  [12-26] 19 (01/11 1900) BP: (67-238)/(36-128) 126/53 (01/11 1900) SpO2:  [97 %-100 %] 100 % (01/11 1900) FiO2 (%):  [40 %-100 %] 40 % (01/11 1619)   Physical Exam  Constitutional: Appears overweight Psych: Affect appropriate to situation Eyes: No scleral injection  HENT: She has a severe ulceration on her tongue Head: Normocephalic.  Cardiovascular: Normal rate and regular rhythm.  Respiratory: Effort normal, non-labored breathing GI: Soft.  No distension. There is no tenderness.  Skin: WDI  Neuro: Mental Status: Patient is awake, alert, she answers yes/no questions.  I have a very difficult time understanding anything that she says, however when asking orientation questions with asking to nod when she hears the correct month, she does not nod for January Cranial Nerves: II: She endorses seeing finger movement bilaterally. pupils are  round, and reactive to light, minimally larger on right.   III,IV, VI: EOMI without ptosis or diploplia.  V: Facial sensation is symmetric to touch VII: Facial movement with left facial weakness VIII: hearing is intact to voice X:  Uvula elevates symmetrically XI: Shoulder shrug is symmetric. XII: Difficult to tell if tongue is midline due to severe ulceration Motor: She has a mild right hemiparesis, no drift on the left. Sensory: Sensation is symmetric to light touch and temperature in the arms and legs. Cerebellar: No clear ataxia   I have reviewed labs in epic and the results pertinent to this consultation are: Mildly elevated creatinine  I have reviewed the images obtained: MRI/MRA-bilateral infarcts which are most consistent with a watershed distribution in the setting of ICA/MCA stenosis on the left.  They are all apparent on FLAIR imaging, suggesting subacute nature.  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  She was hypotensive earlier in the hospitalization, and I suspect that these infarcts are from that time given their appearance on MRI.  Her worsening facial droop could be due to waxing/waning symptoms or due to some other physiological stressor.  She does have intracranial atherosclerosis, and I would favor starting antiplatelet therapy if able.  I think that the MRI of the head and neck that we got was adequate vascular imaging.  Recommendations: 1) echo 2) ASA if able from a surgical standpoint 3) lipid panel, A1c 4) avoid hypotension 5) would assess for other physiologic stressors (e.g. Infection) 6) neurology will follow.  Roland Rack, MD [facility] [phone]  If 7pm- 7am, please page neurology on call as listed in [place].

## 2017-10-25 ENCOUNTER — Inpatient Hospital Stay (HOSPITAL_COMMUNITY): Payer: Medicare Other

## 2017-10-25 DIAGNOSIS — J9601 Acute respiratory failure with hypoxia: Secondary | ICD-10-CM

## 2017-10-25 DIAGNOSIS — I639 Cerebral infarction, unspecified: Secondary | ICD-10-CM

## 2017-10-25 DIAGNOSIS — I6389 Other cerebral infarction: Secondary | ICD-10-CM

## 2017-10-25 LAB — BASIC METABOLIC PANEL
Anion gap: 13 (ref 5–15)
BUN: 86 mg/dL — ABNORMAL HIGH (ref 6–20)
CHLORIDE: 104 mmol/L (ref 101–111)
CO2: 26 mmol/L (ref 22–32)
Calcium: 8.1 mg/dL — ABNORMAL LOW (ref 8.9–10.3)
Creatinine, Ser: 1.8 mg/dL — ABNORMAL HIGH (ref 0.44–1.00)
GFR calc non Af Amer: 27 mL/min — ABNORMAL LOW (ref >60)
GFR, EST AFRICAN AMERICAN: 32 mL/min — AB (ref >60)
Glucose, Bld: 136 mg/dL — ABNORMAL HIGH (ref 65–99)
Potassium: 3.2 mmol/L — ABNORMAL LOW (ref 3.5–5.1)
Sodium: 143 mmol/L (ref 135–145)

## 2017-10-25 LAB — ECHOCARDIOGRAM COMPLETE
E decel time: 190 msec
EERAT: 9.47
FS: 19 % — AB (ref 28–44)
HEIGHTINCHES: 64 in
IVS/LV PW RATIO, ED: 1.03
LA ID, A-P, ES: 35 mm
LA diam end sys: 35 mm
LA diam index: 1.76 cm/m2
LA vol A4C: 46.8 ml
LA vol index: 24.5 mL/m2
LAVOL: 48.7 mL
LV E/e'average: 9.47
LV e' LATERAL: 9.14 cm/s
LVEEMED: 9.47
LVOT area: 2.84 cm2
LVOT diameter: 19 mm
Lateral S' vel: 14.8 cm/s
MV Dec: 190
MV pk E vel: 86.6 m/s
MVPG: 3 mmHg
MVPKAVEL: 150 m/s
PW: 10.3 mm — AB (ref 0.6–1.1)
RV TAPSE: 15.6 mm
TDI e' lateral: 9.14
TDI e' medial: 5.33
WEIGHTICAEL: 2998.26 [oz_av]

## 2017-10-25 LAB — PHOSPHORUS: Phosphorus: 5 mg/dL — ABNORMAL HIGH (ref 2.5–4.6)

## 2017-10-25 LAB — LIPID PANEL
CHOLESTEROL: 106 mg/dL (ref 0–200)
HDL: 18 mg/dL — ABNORMAL LOW (ref >40)
LDL Cholesterol: 48 mg/dL (ref 0–99)
TRIGLYCERIDES: 198 mg/dL — AB (ref <150)
Total CHOL/HDL Ratio: 5.9 RATIO
VLDL: 40 mg/dL (ref 0–40)

## 2017-10-25 LAB — GLUCOSE, CAPILLARY
GLUCOSE-CAPILLARY: 168 mg/dL — AB (ref 65–99)
GLUCOSE-CAPILLARY: 153 mg/dL — AB (ref 65–99)
Glucose-Capillary: 172 mg/dL — ABNORMAL HIGH (ref 65–99)
Glucose-Capillary: 168 mg/dL — ABNORMAL HIGH (ref 65–99)
Glucose-Capillary: 180 mg/dL — ABNORMAL HIGH (ref 65–99)
Glucose-Capillary: 198 mg/dL — ABNORMAL HIGH (ref 65–99)

## 2017-10-25 LAB — LACTIC ACID, PLASMA: Lactic Acid, Venous: 1.5 mmol/L (ref 0.5–1.9)

## 2017-10-25 LAB — HEMOGLOBIN A1C
Hgb A1c MFr Bld: 6.6 % — ABNORMAL HIGH (ref 4.8–5.6)
Mean Plasma Glucose: 142.72 mg/dL

## 2017-10-25 LAB — CULTURE, BLOOD (ROUTINE X 2)
Culture: NO GROWTH
Culture: NO GROWTH
Special Requests: ADEQUATE
Special Requests: ADEQUATE

## 2017-10-25 LAB — MAGNESIUM: Magnesium: 1.8 mg/dL (ref 1.7–2.4)

## 2017-10-25 MED ORDER — MAGNESIUM SULFATE 2 GM/50ML IV SOLN
2.0000 g | Freq: Once | INTRAVENOUS | Status: AC
  Administered 2017-10-25: 2 g via INTRAVENOUS
  Filled 2017-10-25: qty 50

## 2017-10-25 MED ORDER — SODIUM CHLORIDE 0.9 % IV SOLN
INTRAVENOUS | Status: DC
  Administered 2017-10-25 – 2017-10-26 (×2): via INTRAVENOUS
  Administered 2017-10-28: 10 mL/h via INTRAVENOUS

## 2017-10-25 MED ORDER — TRACE MINERALS CR-CU-MN-SE-ZN 10-1000-500-60 MCG/ML IV SOLN
INTRAVENOUS | Status: DC
  Administered 2017-10-25: 18:00:00 via INTRAVENOUS
  Filled 2017-10-25: qty 1992

## 2017-10-25 MED ORDER — FAT EMULSION 20 % IV EMUL
240.0000 mL | INTRAVENOUS | Status: AC
  Filled 2017-10-25: qty 250

## 2017-10-25 MED ORDER — NOREPINEPHRINE BITARTRATE 1 MG/ML IV SOLN
0.0000 ug/min | INTRAVENOUS | Status: DC
  Filled 2017-10-25: qty 4

## 2017-10-25 MED ORDER — ALTEPLASE 2 MG IJ SOLR
2.0000 mg | Freq: Once | INTRAMUSCULAR | Status: AC
  Administered 2017-10-25: 2 mg
  Filled 2017-10-25: qty 2

## 2017-10-25 MED ORDER — NOREPINEPHRINE 4 MG/250ML-% IV SOLN
0.0000 ug/min | INTRAVENOUS | Status: DC
Start: 2017-10-25 — End: 2017-10-27
  Administered 2017-10-25: 2 ug/min via INTRAVENOUS
  Filled 2017-10-25: qty 250

## 2017-10-25 MED ORDER — TRACE MINERALS CR-CU-MN-SE-ZN 10-1000-500-60 MCG/ML IV SOLN
INTRAVENOUS | Status: AC
  Filled 2017-10-25: qty 1992

## 2017-10-25 MED ORDER — VITAL HIGH PROTEIN PO LIQD
1000.0000 mL | ORAL | Status: DC
  Administered 2017-10-25: 1000 mL
  Filled 2017-10-25: qty 1000

## 2017-10-25 MED ORDER — FAT EMULSION 20 % IV EMUL
240.0000 mL | INTRAVENOUS | Status: DC
  Administered 2017-10-25: 240 mL via INTRAVENOUS
  Filled 2017-10-25: qty 250

## 2017-10-25 MED ORDER — BARIUM SULFATE 2 % PO SUSP
450.0000 mL | Freq: Once | ORAL | Status: AC
  Administered 2017-10-25: 450 mL via ORAL

## 2017-10-25 MED ORDER — ACETAMINOPHEN 325 MG PO TABS
650.0000 mg | ORAL_TABLET | Freq: Four times a day (QID) | ORAL | Status: DC | PRN
  Administered 2017-10-25 – 2017-11-02 (×4): 650 mg via ORAL
  Filled 2017-10-25 (×4): qty 2

## 2017-10-25 MED ORDER — POTASSIUM CHLORIDE 10 MEQ/50ML IV SOLN
10.0000 meq | INTRAVENOUS | Status: AC
Start: 2017-10-25 — End: 2017-10-25
  Administered 2017-10-25 (×4): 10 meq via INTRAVENOUS
  Filled 2017-10-25 (×4): qty 50

## 2017-10-25 NOTE — Consult Note (Signed)
[place] Nurse ostomy follow up Stoma type/location: 1 and 1/8 inch ileostomy Stomal assessment/size: red, round, os at center, budded Peristomal assessment: intact, clear Treatment options for stomal/peristomal skin:  Output: green/brown effluent Ostomy pouching: 1pc. Convex pouching system with skin barrier ring Education provided: None.  Patient is re intubated, weaning and is being worked up for a stroke. Enrolled patient in [place] Start Discharge program: No Pouch changes correspond with VAC dressing changes at this time.  [place] Nurse wound follow up Wound type:midline surgical Measurement: 24.5cm x 3.5cm x 2.5cm Wound bed: 50% yellow, 50% red Drainage (amount, consistency, odor) serosanguinous at base today Periwound: MARSI improved. Deep crease at distal end of incision that is willed with skin barrier ring placement from 5-7 o'clock Dressing procedure/placement/frequency: Dressing removed (1 piece of black foam) and wound cleansed with NS and patted dry.  1 piece of black foam inserted into dead space after first placing skin barrier ring around distal end and placing drape strips along either side of wound. Drape applied and dressing is attached to negative pressure, 146mmHg continuous.  An immediate seal is achieved. Next dressing change is on Tuesday, 10/28/17.    [place] [facility] nursing team will follow, and will remain available to this patient, the nursing and medical teams.  Please re-consult if needed. Thanks, Maudie Flakes, MSN, RN, [place], Arther Abbott  Pager# [phone]

## 2017-10-25 NOTE — Progress Notes (Signed)
  Echocardiogram 2D Echocardiogram has been performed.  Ritu Gagliardo G Lillyanna Glandon 10/25/2017, 10:37 AM

## 2017-10-25 NOTE — Progress Notes (Signed)
[place] for TPN  Indication: extensive large and small bowel resection  Patient Measurements: Height: _0  (162.6 cm) Weight: 187 lb 6.3 oz (85 kg) IBW/kg (Calculated) : 54.7 TPN AdjBW (KG): 60.8 Body mass index is 32.17 kg/m.  Insulin Requirements: Hx of DM. 18 Units SSI/24 hrs,  Reg insulin 25 units  Current Nutrition:  TPN at goal, started clears 1/11  IVF: discontinued 1/6  Central access: CVC from 1/1-1/7, PICC triple lumen 1/7- TPN start date: 1/3  ASSESSMENT                                                                                                          HPI: 76 yoF to ED 1/1 with abd pain, pneumatosis R colon > surgery. Hx of CAD, DM, Diverticulosis, HPL, HTN, chronic pancreatitis noted CVA  Significant events:  1/1 OR exp lap. R colectomy, wound vac 1/3 return to OR- expl lap > sm bowel ischemia, resected ischemic ileum > VAC replaced. Begin TPN  1/5 OR Re-exploration of abdomen with end proximal ileostomy and closure of abdominal wall hernia 1/7 CVC removed, cath tip cultured, blood cultured for persistent fever. PICC placed for TPN. 1/11 start clears, cont TPN for nutritional support  Today:   Glucose - DM on Victoza PTA, CBGs improved after TPN with 25 units of insulin hung last night  Electrolytes - K+ 3.2(dispite replacement yest)  Mag 1.8 ,Phos elevated CorrCa WNL  Renal - SCr 1.8, making urine on lasix, I/O -1.6L, NG out. Weight up, volume overloaded. BUN high, continue with current protein goal in setting of critical illness.  LFTs - AST/ALT elevated but coming down, Alk Phos elevated. Bilirubin now increased  TGs - were 349 on 1/1, Propofol d/c 1/2, Trig 366 (1/4), 168 (1/7), 198 (1/12)  Prealbumin - < 5  remains low 2nd critical illness/inflammation  NUTRITIONAL GOALS                                                                                             RD recs: 10/17/17 Kcal 4196-2229  , protein 109-119 gm  Hold 20% lipid emulsion for first 7 days for ICU patients per ASPEN guidelines (Start date 10/17/17)  Clinimix 5/15 at a goal rate of 83 ml/hr to provide:1414 kcal and 100 gm protein.  111% kcal and 92 % protein.   PLAN   KCl 85mq x6  At 1800 today:  Change to electrolyte-free Clinimix 5/15 at 83 ml/hr due to elevated Phos   continue 20% lipid emulsion at 20 mls/hr for 12 hours (held for first 7 days for ICU patients per ASPEN guidelines)  continue regular insulin in TPN so that patient will receive 25 units over the 24 hour bag.  TPN to contain standard multivitamins and trace elements.  Continue SSI moderate scale q4h  Mag and phos with am BMet  TPN lab panels on Mondays & Thursdays.    F/u daily.  Dolly Rias RPh 10/25/2017, 7:48 AM Pager [phone]

## 2017-10-25 NOTE — Progress Notes (Signed)
RN unable to complete an NIH at this time with the patient intubated on sedation.

## 2017-10-25 NOTE — Progress Notes (Signed)
7 Days Post-Op   Subjective/Chief Complaint: Events of yesterday noted. Intubated and sedated   Objective: Vital signs in last 24 hours: Temp:  [97.6 F (36.4 C)-101.6 F (38.7 C)] 101.6 F (38.7 C) (01/12 0837) Pulse Rate:  [32-114] 86 (01/12 0600) Resp:  [13-26] 23 (01/12 0600) BP: (60-231)/(35-139) 85/52 (01/12 0600) SpO2:  [98 %-100 %] 100 % (01/12 0600) FiO2 (%):  [30 %-100 %] 30 % (01/12 0837) Last BM Date: 10/24/17  Intake/Output from previous day: 01/11 0701 - 01/12 0700 In: 2393.4 [I.V.:2053.4; IV Piggyback:300] Out: 4000 [Urine:3600; Stool:400] Intake/Output this shift: No intake/output data recorded.  General appearance: intubated and sedated Resp: clear to auscultation bilaterally and on vent Cardio: regular rate and rhythm GI: soft, ostomy pink with some output  Lab Results:  Recent Labs    10/23/17 0438  WBC 18.5*  HGB 10.4*  HCT 29.9*  PLT 205   BMET Recent Labs    10/24/17 0555 10/25/17 0440  NA 141 143  K 2.7* 3.2*  CL 104 104  CO2 29 26  GLUCOSE 165* 136*  BUN 80* 86*  CREATININE 1.55* 1.80*  CALCIUM 8.4* 8.1*   PT/INR No results for input(s): LABPROT, INR in the last 72 hours. ABG Recent Labs    10/24/17 1600  PHART 7.363  HCO3 27.7    Studies/Results: [facility] Contrast  Result Date: 10/24/2017 CLINICAL DATA:  Left facial droop.  Stroke. EXAM: MRI HEAD WITHOUT CONTRAST MRA HEAD WITHOUT CONTRAST MRA NECK WITHOUT CONTRAST TECHNIQUE: Multiplanar, multiecho pulse sequences of the brain and surrounding structures were obtained without intravenous contrast. Angiographic images of the Circle of Willis were obtained using MRA technique without intravenous contrast. Angiographic images of the neck were obtained using MRA technique without intravenous contrast. Carotid stenosis measurements (when applicable) are obtained utilizing NASCET criteria, using the distal internal carotid diameter as the denominator. COMPARISON:  CT head  10/24/2017 FINDINGS: MRI HEAD FINDINGS Brain: Image quality degraded by motion. Acute infarcts bilaterally, left greater than right. The largest area of infarction is in the left parietal white matter. This area shows well-circumscribed hypodensity on CT suggesting this may be subacute. Additional smaller areas of acute infarct in the left temporal lobe also noted to be hypodense on CT. Small area of acute infarct in the right frontal parietal white matter. Negative for hemorrhage. Negative for hydrocephalus. Negative for hemorrhage mass or midline shift. Vascular: Loss of flow void in the left internal carotid artery compatible with occlusion. Normal flow void right internal carotid artery and posterior circulation. Skull and upper cervical spine: Negative Sinuses/Orbits: Paranasal sinuses clear.  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 Right internal carotid artery patent without stenosis. High-grade stenosis distal left M1 segment. Both anterior cerebral arteries patent. Right middle cerebral artery is patent with irregularity which may be due to motion Both vertebral arteries are patent. The basilar is patent. Posterior cerebral arteries patent bilaterally. 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 Right carotid artery is patent. Mild narrowing of the right carotid bifurcation. Both vertebral arteries are patent to the basilar. IMPRESSION: Acute cerebral infarction bilaterally left greater than right. Acute infarcts in the left parietal white matter and left temporal lobe show hypodensity on CT suggesting these may be subacute. Small area of acute infarct in  the right frontal parietal white matter. 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 Given the acute infarcts, this may be acute. Severe stenosis left M1 Mild stenosis right carotid bifurcation. Image quality degraded by motion. These results were called by telephone at the time of interpretation on 10/24/2017 at 12:37 pm to Dr. Lake Bells, who verbally acknowledged these results. Electronically Signed   By: Franchot Gallo M.D.   On: 10/24/2017 12:38   [name] Neck Wo Contrast  Result Date: 10/24/2017 CLINICAL DATA:  Left facial droop.  Stroke. EXAM: MRI HEAD WITHOUT CONTRAST MRA HEAD WITHOUT CONTRAST MRA NECK WITHOUT CONTRAST TECHNIQUE: Multiplanar, multiecho pulse sequences of the brain and surrounding structures were obtained without intravenous contrast. Angiographic images of the Circle of Willis were obtained using MRA technique without intravenous contrast. Angiographic images of the neck were obtained using MRA technique without intravenous contrast. Carotid stenosis measurements (when applicable) are obtained utilizing NASCET criteria, using the distal internal carotid diameter as the denominator. COMPARISON:  CT head 10/24/2017 FINDINGS: MRI HEAD FINDINGS Brain: Image quality degraded by motion. Acute infarcts bilaterally, left greater than right. The largest area of infarction is in the left parietal white matter. This area shows well-circumscribed hypodensity on CT suggesting this may be subacute. Additional smaller areas of acute infarct in the left temporal lobe also noted to be hypodense on CT. Small area of acute infarct in the right frontal parietal white matter. Negative for hemorrhage. Negative for hydrocephalus. Negative for hemorrhage mass or midline shift. Vascular: Loss of flow void in the left internal carotid artery compatible with occlusion. Normal flow void right internal carotid artery and posterior circulation. Skull and upper cervical spine: Negative Sinuses/Orbits: Paranasal sinuses clear.  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 Right internal carotid artery patent without stenosis. High-grade stenosis distal left M1 segment. Both anterior cerebral arteries patent. Right middle cerebral artery is patent with irregularity which may be due to motion Both vertebral arteries are patent. The basilar is patent. Posterior cerebral arteries patent bilaterally. 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 Right carotid artery is patent. Mild narrowing of the right carotid bifurcation. Both vertebral arteries are patent to the basilar. IMPRESSION: Acute cerebral infarction bilaterally left greater than right. Acute infarcts in the left parietal white matter and left temporal lobe show hypodensity on CT suggesting these may be subacute. Small area of acute infarct in the right frontal parietal white matter. 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 Given the acute infarcts, this may be acute. Severe stenosis left M1 Mild stenosis right carotid bifurcation. Image quality degraded by motion. These results were called by telephone at the time of interpretation on 10/24/2017 at 12:37 pm to Dr. Lake Bells, who verbally acknowledged these results. Electronically Signed   By: Franchot Gallo M.D.   On: 10/24/2017 12:38   Mr Brain Wo Contrast  Result Date: 10/24/2017 CLINICAL DATA:  Left facial droop.  Stroke. EXAM: MRI HEAD WITHOUT CONTRAST MRA HEAD WITHOUT CONTRAST MRA NECK WITHOUT CONTRAST TECHNIQUE: Multiplanar, multiecho pulse sequences of the brain and surrounding structures were obtained without intravenous contrast. Angiographic images of the Circle of Willis were obtained using MRA  technique without intravenous contrast.  Angiographic images of the neck were obtained using MRA technique without intravenous contrast. Carotid stenosis measurements (when applicable) are obtained utilizing NASCET criteria, using the distal internal carotid diameter as the denominator. COMPARISON:  CT head 10/24/2017 FINDINGS: MRI HEAD FINDINGS Brain: Image quality degraded by motion. Acute infarcts bilaterally, left greater than right. The largest area of infarction is in the left parietal white matter. This area shows well-circumscribed hypodensity on CT suggesting this may be subacute. Additional smaller areas of acute infarct in the left temporal lobe also noted to be hypodense on CT. Small area of acute infarct in the right frontal parietal white matter. Negative for hemorrhage. Negative for hydrocephalus. Negative for hemorrhage mass or midline shift. Vascular: Loss of flow void in the left internal carotid artery compatible with occlusion. Normal flow void right internal carotid artery and posterior circulation. Skull and upper cervical spine: Negative Sinuses/Orbits: Paranasal sinuses clear.  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 Right internal carotid artery patent without stenosis. High-grade stenosis distal left M1 segment. Both anterior cerebral arteries patent. Right middle cerebral artery is patent with irregularity which may be due to motion Both vertebral arteries are patent. The basilar is patent. Posterior cerebral arteries patent bilaterally. 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 Right carotid artery is patent. Mild narrowing of the right carotid bifurcation. Both vertebral arteries are patent to the basilar. IMPRESSION: Acute  cerebral infarction bilaterally left greater than right. Acute infarcts in the left parietal white matter and left temporal lobe show hypodensity on CT suggesting these may be subacute. Small area of acute infarct in the right frontal parietal white matter. 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 Given the acute infarcts, this may be acute. Severe stenosis left M1 Mild stenosis right carotid bifurcation. Image quality degraded by motion. These results were called by telephone at the time of interpretation on 10/24/2017 at 12:37 pm to Dr. Lake Bells, who verbally acknowledged these results. Electronically Signed   By: Franchot Gallo M.D.   On: 10/24/2017 12:38   Portable Chest Xray  Result Date: 10/25/2017 CLINICAL DATA:  Acute respiratory failure. EXAM: PORTABLE CHEST 1 VIEW COMPARISON:  One-view chest x-ray 10/24/2017 FINDINGS: Endotracheal tube is stable. NG tube courses off the inferior border the film. A right-sided PICC line is stable. Atherosclerotic changes are present at the aortic arch. Lung volumes are low. Mild pulmonary vascular congestion is present. No focal airspace consolidation is present. There is no pneumothorax. IMPRESSION: 1. Low lung volumes and mild atelectasis without focal airspace disease otherwise. 2. Support apparatus is stable. Electronically Signed   By: San Morelle M.D.   On: 10/25/2017 07:20   Portable Chest Xray  Result Date: 10/24/2017 CLINICAL DATA:  Ventilator support.  Stroke. EXAM: PORTABLE CHEST 1 VIEW COMPARISON:  10/22/2017 FINDINGS: Endotracheal tube tip is 3 cm above the carina. Nasogastric tube enters the abdomen. The lungs are clear with improved aeration at the bases compared to the previous study. No worsening or new finding. Right arm PICC tip at the SVC RA junction. IMPRESSION: Lines and tubes well positioned. Better aeration the lower lungs when compared to the prior study.  Electronically Signed   By: Nelson Chimes M.D.   On: 10/24/2017 15:36   Ct  Head Code Stroke Wo Contrast`  Result Date: 10/24/2017 CLINICAL DATA:  Code stroke.  Left facial droop EXAM: CT HEAD WITHOUT CONTRAST TECHNIQUE: Contiguous axial images were obtained from the base of the skull through the vertex without intravenous contrast. COMPARISON:  None. FINDINGS: Brain: Ventricle size normal. Mild atrophy. Chronic infarct left parietal white matter. Probable chronic subinsular infarct on the left. Negative for acute infarct.  Negative for hemorrhage or mass. Vascular: Negative for hyperdense vessel.  Atherosclerotic disease. Skull: Negative Sinuses/Orbits: Negative sinuses.  Bilateral cataract removal. Other: None ASPECTS ([place] Stroke Program Early CT Score) - Ganglionic level infarction (caudate, lentiform nuclei, internal capsule, insula, M1-M3 cortex): 7 - Supraganglionic infarction (M4-M6 cortex): 3 Total score (0-10 with 10 being normal): 10 IMPRESSION: 1. No acute abnormality 2. ASPECTS is 10 3. These results were called by telephone at the time of interpretation on 10/24/2017 at 11:11 am to Dr. Simonne Maffucci , who verbally acknowledged these results. Electronically Signed   By: Franchot Gallo M.D.   On: 10/24/2017 11:11    Anti-infectives: Anti-infectives (From admission, onward)   Start     Dose/Rate Route Frequency Ordered Stop   10/25/17 1000  meropenem (MERREM) 1 g in sodium chloride 0.9 % 100 mL IVPB     1 g 200 mL/hr over 30 Minutes Intravenous Every 12 hours 10/24/17 1547     10/24/17 1600  meropenem (MERREM) 2 g in sodium chloride 0.9 % 100 mL IVPB     2 g 200 mL/hr over 30 Minutes Intravenous  Once 10/24/17 1547 10/24/17 1647   10/17/17 1200  anidulafungin (ERAXIS) 100 mg in sodium chloride 0.9 % 100 mL IVPB     100 mg 78 mL/hr over 100 Minutes Intravenous Every 24 hours 10/16/17 1110 10/22/17 1423   10/16/17 1200  anidulafungin (ERAXIS) 200 mg in sodium chloride 0.9 % 200 mL IVPB      200 mg 78 mL/hr over 200 Minutes Intravenous  Once 10/16/17 1110 10/16/17 1659   10/16/17 0200  piperacillin-tazobactam (ZOSYN) IVPB 3.375 g     3.375 g 12.5 mL/hr over 240 Minutes Intravenous Every 8 hours 10/15/17 2030 10/22/17 2121   10/15/17 1000  piperacillin-tazobactam (ZOSYN) IVPB 2.25 g  Status:  Discontinued     2.25 g 100 mL/hr over 30 Minutes Intravenous Every 8 hours 10/15/17 0823 10/15/17 2028   10/14/17 1000  piperacillin-tazobactam (ZOSYN) IVPB 3.375 g  Status:  Discontinued     3.375 g 12.5 mL/hr over 240 Minutes Intravenous Every 8 hours 10/14/17 0805 10/15/17 0823   10/14/17 0315  piperacillin-tazobactam (ZOSYN) IVPB 3.375 g     3.375 g 100 mL/hr over 30 Minutes Intravenous  Once 10/14/17 0302 10/14/17 0440   10/14/17 0315  vancomycin (VANCOCIN) IVPB 1000 mg/200 mL premix     1,000 mg 200 mL/hr over 60 Minutes Intravenous  Once 10/14/17 0303 10/14/17 0440      Assessment/Plan: s/p Procedure(s): RE-EXPLORATION OF ABDOMEN, ILEOSTOMY CREATION (N/A) ischemic bowel. ileostomy looks good. consider enteral feeds soon Acute stroke per neurology VDRF and Critical Care per CCM  LOS: 11 days    TOTH III,PAUL S 10/25/2017

## 2017-10-25 NOTE — Progress Notes (Signed)
PT Cancellation Note  Patient Details Name: HARJIT DOUDS MRN: [id number] DOB: 24-Jun-1946   Cancelled Treatment:     PT deferred this date.  Noted pt with acute CVA and on vent at this time. PT will dc services at this time.  Please reorder when pt is extubated, if she will benefit from PT. Thanks.    Braelon Sprung 10/25/2017, 12:52 PM

## 2017-10-25 NOTE — Progress Notes (Signed)
Patient was intubated, therefore slightly less cooperative with exam, but she continues to appear to have a mild right hemiparesis and left facial droop.  There is no definite embolic source on echo.  LDL is 48.  At this time, I strongly suspect that she had cerebral ischemia during her episodes of hypotension earlier in the hospitalization.  I suspect that she has at waxing/waning exam due to other physiological stressors, and her fever this morning could be suggestive that the worsening yesterday was something brewing.  No further workup from a neurological standpoint, she will need PT, OT, speech once able to participate.  Neurology will sign off at this time, please call with further questions or concerns.  Roland Rack, MD [facility] [phone]  If 7pm- 7am, please page neurology on call as listed in [place].

## 2017-10-25 NOTE — Progress Notes (Signed)
PULMONARY / CRITICAL CARE MEDICINE   Name: [name] MRN: [id number] DOB: Mar 02, 1946    ADMISSION DATE:  10/14/2017 CONSULTATION DATE:  10/14/2017  REFERRING MD:  Marlou Starks  CHIEF COMPLAINT:  Abdominal pain  HISTORY OF PRESENT ILLNESS:   72 y/o female with multiple medical problems admitted on 1/1 with abdominal pain due to ischemic colon and ileum.  Had an acute stroke during this process recognized on 1/11.  Past Medical History:  Diagnosis Date  . Anxiety   . Arthropathy, unspecified, site unspecified   . Blind loop syndrome   . Carotid artery disease ([place])    documented occlusion of left ICA  . Coronary artery disease    a. known 60-70% mid-LAD stenosis with caths in 1999, 2002, 2006, and 2012 showing stable anatomy b. low-risk NST in 10/2015  . Depression   . Diabetes insipidus ([place])   . Diabetes mellitus   . Diverticulosis   . Dizziness    chronic  . Dyslipidemia   . Dyspnea    with exertion  . Fatty liver   . GERD (gastroesophageal reflux disease)    hiatial hernia  . Hepatomegaly   . Hiatal hernia   . HTN (hypertension)   . Hyperlipidemia   . Hypertension   . Irritable bowel syndrome   . Melanosis coli   . Metatarsal fracture right  . Normal nuclear stress test    nml 01/03/10;06/19/07  . Other, mixed, or unspecified nondependent drug abuse, unspecified   . Pancreatitis, chronic ([place])   . Peripheral vascular disease ([place]) 02/20/10   s/p stent of left SFA  . Sleep apnea   . Stroke ([place])   . Thyroid nodule   . Unspecified essential hypertension   . Vitamin B12 deficiency      SUBJECTIVE:  Acute stroke yesterday, code stroke intubated for airway protection, fever this morning  VITAL SIGNS: BP (!) 191/79   Pulse 98   Temp (!) 101.6 F (38.7 C) (Oral)   Resp 17   Ht 5\' 4"  (1.626 m)   Wt 85 kg (187 lb 6.3 oz)   SpO2 100%   BMI 32.17 kg/m   HEMODYNAMICS:    VENTILATOR SETTINGS: Vent Mode: PSV;CPAP FiO2 (%):  [30 %-100 %] 30 % Set Rate:  [14 bmp]  14 bmp Vt Set:  [500 mL] 500 mL PEEP:  [5 cmH20] 5 cmH20 Pressure Support:  [5 cmH20] 5 cmH20 Plateau Pressure:  [14 cmH20-20 cmH20] 20 cmH20  INTAKE / OUTPUT: I/O last 3 completed shifts: In: 3389.4 [I.V.:3049.4; Other:40; IV Piggyback:300] Out: 6600 [Urine:5950; Stool:650]  PHYSICAL EXAMINATION:  General:  In bed on vent HENT: NCAT ETT in place PULM: CTA B, vent supported breathing CV: RRR, no mgr GI: minimal bowel sounds, midline dressing intactr MSK: normal bulk and tone Neuro: Awake, follows commands, nods head appropriately, left arm strength 5 out of 5, wiggles toes bilaterally, will not raise right arm from bed strength intact on right    LABS:  BMET Recent Labs  Lab 10/23/17 0438 10/24/17 0555 10/25/17 0440  NA 137 141 143  K 2.7* 2.7* 3.2*  CL 102 104 104  CO2 25 29 26   BUN 83* 80* 86*  CREATININE 2.01* 1.55* 1.80*  GLUCOSE 186* 165* 136*    Electrolytes Recent Labs  Lab 10/23/17 0438 10/24/17 0555 10/25/17 0440  CALCIUM 8.0* 8.4* 8.1*  MG 1.5* 1.9 1.8  PHOS 3.0 4.2 5.0*    CBC Recent Labs  Lab 10/19/17 0930 10/20/17 0500 10/23/17  0438  WBC 20.2* 23.1* 18.5*  HGB 9.4* 10.2* 10.4*  HCT 27.4* 30.3* 29.9*  PLT 116* 151 205    Coag's No results for input(s): APTT, INR in the last 168 hours.  Sepsis Markers No results for input(s): LATICACIDVEN, PROCALCITON, O2SATVEN in the last 168 hours.  ABG Recent Labs  Lab 10/24/17 1600  PHART 7.363  PCO2ART 49.9*  PO2ART 415*    Liver Enzymes Recent Labs  Lab 10/20/17 0500 10/22/17 0500 10/23/17 0438  AST 106* 55* 46*  ALT 127* 64* 58*  ALKPHOS 213* 160* 172*  BILITOT 1.0 1.5* 1.5*  ALBUMIN 1.4* 1.3* 1.5*    Cardiac Enzymes No results for input(s): TROPONINI, PROBNP in the last 168 hours.  Glucose Recent Labs  Lab 10/24/17 1048 10/24/17 1545 10/24/17 1941 10/24/17 2325 10/25/17 0324 10/25/17 0843  GLUCAP 186* 217* 143* 139* 168* 153*    Imaging Mr [facility] Wo  Contrast  Result Date: 10/24/2017 CLINICAL DATA:  Left facial droop.  Stroke. EXAM: MRI HEAD WITHOUT CONTRAST MRA HEAD WITHOUT CONTRAST MRA NECK WITHOUT CONTRAST TECHNIQUE: Multiplanar, multiecho pulse sequences of the brain and surrounding structures were obtained without intravenous contrast. Angiographic images of the Circle of Willis were obtained using MRA technique without intravenous contrast. Angiographic images of the neck were obtained using MRA technique without intravenous contrast. Carotid stenosis measurements (when applicable) are obtained utilizing NASCET criteria, using the distal internal carotid diameter as the denominator. COMPARISON:  CT head 10/24/2017 FINDINGS: MRI HEAD FINDINGS Brain: Image quality degraded by motion. Acute infarcts bilaterally, left greater than right. The largest area of infarction is in the left parietal white matter. This area shows well-circumscribed hypodensity on CT suggesting this may be subacute. Additional smaller areas of acute infarct in the left temporal lobe also noted to be hypodense on CT. Small area of acute infarct in the right frontal parietal white matter. Negative for hemorrhage. Negative for hydrocephalus. Negative for hemorrhage mass or midline shift. Vascular: Loss of flow void in the left internal carotid artery compatible with occlusion. Normal flow void right internal carotid artery and posterior circulation. Skull and upper cervical spine: Negative Sinuses/Orbits: Paranasal sinuses clear.  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 Right internal carotid artery patent without stenosis. High-grade stenosis distal left M1 segment. Both anterior cerebral arteries patent. Right middle cerebral artery is patent with irregularity which may be due to motion Both vertebral arteries are patent. The basilar is patent.  Posterior cerebral arteries patent bilaterally. 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 Right carotid artery is patent. Mild narrowing of the right carotid bifurcation. Both vertebral arteries are patent to the basilar. IMPRESSION: Acute cerebral infarction bilaterally left greater than right. Acute infarcts in the left parietal white matter and left temporal lobe show hypodensity on CT suggesting these may be subacute. Small area of acute infarct in the right frontal parietal white matter. 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 Given the acute infarcts, this may be acute. Severe stenosis left M1 Mild stenosis right carotid bifurcation. Image quality degraded by motion. These results were called by telephone at the time of interpretation on 10/24/2017 at 12:37 pm to Dr. Lake Bells, who verbally acknowledged these results. Electronically Signed   By: Juanda Crumble  Carlis Abbott M.D.   On: 10/24/2017 12:38   [name] Neck Wo Contrast  Result Date: 10/24/2017 CLINICAL DATA:  Left facial droop.  Stroke. EXAM: MRI HEAD WITHOUT CONTRAST MRA HEAD WITHOUT CONTRAST MRA NECK WITHOUT CONTRAST TECHNIQUE: Multiplanar, multiecho pulse sequences of the brain and surrounding structures were obtained without intravenous contrast. Angiographic images of the Circle of Willis were obtained using MRA technique without intravenous contrast. Angiographic images of the neck were obtained using MRA technique without intravenous contrast. Carotid stenosis measurements (when applicable) are obtained utilizing NASCET criteria, using the distal internal carotid diameter as the denominator. COMPARISON:  CT head 10/24/2017 FINDINGS: MRI HEAD FINDINGS Brain: Image quality degraded by motion. Acute infarcts bilaterally, left greater than right. The largest  area of infarction is in the left parietal white matter. This area shows well-circumscribed hypodensity on CT suggesting this may be subacute. Additional smaller areas of acute infarct in the left temporal lobe also noted to be hypodense on CT. Small area of acute infarct in the right frontal parietal white matter. Negative for hemorrhage. Negative for hydrocephalus. Negative for hemorrhage mass or midline shift. Vascular: Loss of flow void in the left internal carotid artery compatible with occlusion. Normal flow void right internal carotid artery and posterior circulation. Skull and upper cervical spine: Negative Sinuses/Orbits: Paranasal sinuses clear.  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 Right internal carotid artery patent without stenosis. High-grade stenosis distal left M1 segment. Both anterior cerebral arteries patent. Right middle cerebral artery is patent with irregularity which may be due to motion Both vertebral arteries are patent. The basilar is patent. Posterior cerebral arteries patent bilaterally. 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 Right carotid artery is patent. Mild narrowing of the right carotid bifurcation. Both vertebral arteries are patent to the basilar. IMPRESSION: Acute cerebral infarction bilaterally left greater than right. Acute infarcts in the left parietal white matter and left temporal lobe show hypodensity on CT suggesting these may be subacute. Small area of acute infarct in the right frontal parietal white matter. 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 Given the acute infarcts, this may be acute. Severe stenosis left M1 Mild  stenosis right carotid bifurcation. Image quality degraded by motion. These results were called by telephone at the time of interpretation on 10/24/2017 at 12:37 pm to Dr. Lake Bells, who verbally acknowledged these results. Electronically Signed   By: Franchot Gallo M.D.   On: 10/24/2017 12:38   Mr Brain Wo Contrast  Result Date: 10/24/2017 CLINICAL DATA:  Left facial droop.  Stroke. EXAM: MRI HEAD WITHOUT CONTRAST MRA HEAD WITHOUT CONTRAST MRA NECK WITHOUT CONTRAST TECHNIQUE: Multiplanar, multiecho pulse sequences of the brain and surrounding structures were obtained without intravenous contrast. Angiographic images of the Circle of Willis were obtained using MRA technique without intravenous contrast. Angiographic images of the neck were obtained using MRA technique without intravenous contrast. Carotid stenosis measurements (when applicable) are obtained utilizing NASCET criteria, using the distal internal carotid diameter as the denominator. COMPARISON:  CT head 10/24/2017 FINDINGS: MRI HEAD FINDINGS Brain: Image quality degraded by motion. Acute infarcts bilaterally, left greater than right. The largest area of infarction is in the left parietal white matter. This area shows well-circumscribed hypodensity on CT suggesting this may  be subacute. Additional smaller areas of acute infarct in the left temporal lobe also noted to be hypodense on CT. Small area of acute infarct in the right frontal parietal white matter. Negative for hemorrhage. Negative for hydrocephalus. Negative for hemorrhage mass or midline shift. Vascular: Loss of flow void in the left internal carotid artery compatible with occlusion. Normal flow void right internal carotid artery and posterior circulation. Skull and upper cervical spine: Negative Sinuses/Orbits: Paranasal sinuses clear.  Bilateral cataract removal Other: None MRA HEAD FINDINGS Image quality degraded by motion Occluded left internal carotid artery with reconstitution in the  supraclinoid segment. Right internal carotid artery patent without stenosis. High-grade stenosis distal left M1 segment. Both anterior cerebral arteries patent. Right middle cerebral artery is patent with irregularity which may be due to motion Both vertebral arteries are patent. The basilar is patent. Posterior cerebral arteries patent bilaterally. 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 Right carotid artery is patent. Mild narrowing of the right carotid bifurcation. Both vertebral arteries are patent to the basilar. IMPRESSION: Acute cerebral infarction bilaterally left greater than right. Acute infarcts in the left parietal white matter and left temporal lobe show hypodensity on CT suggesting these may be subacute. Small area of acute infarct in the right frontal parietal white matter. 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 Given the acute infarcts, this may be acute. Severe stenosis left M1 Mild stenosis right carotid bifurcation. Image quality degraded by motion. These results were called by telephone at the time of interpretation on 10/24/2017 at 12:37 pm to Dr. Lake Bells, who verbally acknowledged these results. Electronically Signed   By: Franchot Gallo M.D.   On: 10/24/2017 12:38   Portable Chest Xray  Result Date: 10/25/2017 CLINICAL DATA:  Acute respiratory failure. EXAM: PORTABLE CHEST 1 VIEW COMPARISON:  One-view chest x-ray 10/24/2017 FINDINGS: Endotracheal tube is stable. NG tube courses off the inferior border the film. A right-sided PICC line is stable. Atherosclerotic changes are present at the aortic arch. Lung volumes are low. Mild pulmonary vascular congestion is present. No focal airspace consolidation is present. There is no pneumothorax. IMPRESSION: 1. Low lung volumes and mild  atelectasis without focal airspace disease otherwise. 2. Support apparatus is stable. Electronically Signed   By: San Morelle M.D.   On: 10/25/2017 07:20   Portable Chest Xray  Result Date: 10/24/2017 CLINICAL DATA:  Ventilator support.  Stroke. EXAM: PORTABLE CHEST 1 VIEW COMPARISON:  10/22/2017 FINDINGS: Endotracheal tube tip is 3 cm above the carina. Nasogastric tube enters the abdomen. The lungs are clear with improved aeration at the bases compared to the previous study. No worsening or new finding. Right arm PICC tip at the SVC RA junction. IMPRESSION: Lines and tubes well positioned. Better aeration the lower lungs when compared to the prior study. Electronically Signed   By: Nelson Chimes M.D.   On: 10/24/2017 15:36   Ct Head Code Stroke Wo Contrast`  Result Date: 10/24/2017 CLINICAL DATA:  Code stroke.  Left facial droop EXAM: CT HEAD WITHOUT CONTRAST TECHNIQUE: Contiguous axial images were obtained from the base of the skull through the vertex without intravenous contrast. COMPARISON:  None. FINDINGS: Brain: Ventricle size normal. Mild atrophy. Chronic infarct left parietal white matter. Probable chronic subinsular infarct on the left. Negative for acute infarct.  Negative for hemorrhage or mass. Vascular: Negative  for hyperdense vessel.  Atherosclerotic disease. Skull: Negative Sinuses/Orbits: Negative sinuses.  Bilateral cataract removal. Other: None ASPECTS ([place] Stroke Program Early CT Score) - Ganglionic level infarction (caudate, lentiform nuclei, internal capsule, insula, M1-M3 cortex): 7 - Supraganglionic infarction (M4-M6 cortex): 3 Total score (0-10 with 10 being normal): 10 IMPRESSION: 1. No acute abnormality 2. ASPECTS is 10 3. These results were called by telephone at the time of interpretation on 10/24/2017 at 11:11 am to Dr. Simonne Maffucci , who verbally acknowledged these results. Electronically Signed   By: Franchot Gallo M.D.   On: 10/24/2017 11:11     STUDIES:   1/1 CT abdomen/pelvis: diffuse pneumatosis in cecum and ascending colon with distension, bibasilar airspace opacities, aortic calcification, non-obstructing left kidney stone Jan 11 CT head > NAICP January 11 MRI/MRA brain: Acute cerebral infarction bilateral left greater than right, left parietal and left temporal lobe.  Small acute infarct right frontal parietal white hemorrhage, occluded left common carotid.  CULTURES: 1/1 Blood > neg 1/1 urine > neg 1/7 blood > neg 1/7 cath tip > neg 1/11 blood >   ANTIBIOTICS: 12/31 zosyn > 1/9 1/4 Anidulafungin >  1/9 1/11 Meropenem >   SIGNIFICANT EVENTS: 1/1 ex lap, R colectomy 1/3 ex lap, partial omentectomy, resection of ischemic ileum (130cm)  1/5 Ex lap, abdominal wall closure 1/9 Extubation 1/11 Acute CVA, reintubated  EVENTS   LINES/TUBES: 1/1 ETT > 1/9, 1/11 >  1/1 R IJ CVL > 1/8 1/7 PICC >   DISCUSSION: 72 y/o female with multiple medical problems admitted with ischemic bowel.  Required bowel resection, exploratory laparotomy x3 closed on January 5.  Acute CVA recognized on January 11.  This is believed to be a watershed infarct related to severe sepsis on admission with ischemic bowel and bowel perforation.  Reintubated January 11.  Likely needs tracheostomy  ASSESSMENT / PLAN:  NEUROLOGIC A:   Acute bilateral CVA > symptoms wax and wane with fever, BP changes Need for sedation for mechanical vent synchrony P:   RASS goal: 0 to -1 Add back precedex for comfort Serial neuro checks ASA per neuro/stroke team BP goals and secondary stroke prevention per neurology team  PULMONARY A: Acute respiratory failure with hypoxemia P:   Full mechanical vent support VAP prevention Daily WUA/SBT Will likely need tracheostomy 1/15  CARDIOVASCULAR A:  Known CAD Hypertension Septic shock now resolved P:  Tele Monitor hemodynamics  RENAL A:   AKI > worse on 1/12 P:   Monitor BMET and UOP Replace electrolytes as  needed Repeat U/A supp K Low dose fluids Stop lasix  GASTROINTESTINAL A:   Shock liver> resolved Ischemic bowel> resected, belly close Profound protein calorie malnutrition P:   Restart trickle feeds  HEMATOLOGIC A:   Mild anemia without bleeding P:  Monitor for bleeding  INFECTIOUS A:   Ischemic colon/bowel with peritonitis s/p washout New fever 1/12 > belly source?  Chest x-ray clear P:   CT abdomen and pelvis Follow-up blood cultures Continue meropenem   ENDOCRINE A:   DM2 with hyperglycemia  P:   SSI  We will take her onto the pulmonary and critical care service  FAMILY  - Updates: Her brother was updated at bedside by me on January 12  - Inter-disciplinary family meet or Palliative Care meeting due by:  day 7  My cc time 40 minutes  Roselie Awkward, MD [place] [facility] Pager: [phone] Cell: [phone] After 3pm or if no response, call [phone]   10/25/2017, 10:37 AM

## 2017-10-25 NOTE — Progress Notes (Signed)
OT Cancellation Note  Patient Details Name: [name] MRN: [id number] DOB: [birth year]   Cancelled Treatment:    Reason Eval/Treat Not Completed: Medical issues which prohibited therapy.  Noted pt with acute CVA and on vent at this time. Please reorder when pt is extubated, if she will benefit from OT. Thanks.    Syana Degraffenreid 10/25/2017, 12:19 PM  Lesle Chris, OTR/L [phone] 10/25/2017

## 2017-10-26 ENCOUNTER — Inpatient Hospital Stay (HOSPITAL_COMMUNITY): Payer: Medicare Other

## 2017-10-26 DIAGNOSIS — K559 Vascular disorder of intestine, unspecified: Secondary | ICD-10-CM

## 2017-10-26 LAB — MAGNESIUM: Magnesium: 2 mg/dL (ref 1.7–2.4)

## 2017-10-26 LAB — CBC WITH DIFFERENTIAL/PLATELET
BASOS ABS: 0.1 10*3/uL (ref 0.0–0.1)
Basophils Relative: 0 %
EOS ABS: 0.1 10*3/uL (ref 0.0–0.7)
EOS PCT: 0 %
HCT: 27.6 % — ABNORMAL LOW (ref 36.0–46.0)
Hemoglobin: 9.2 g/dL — ABNORMAL LOW (ref 12.0–15.0)
LYMPHS ABS: 2.2 10*3/uL (ref 0.7–4.0)
Lymphocytes Relative: 15 %
MCH: 28.5 pg (ref 26.0–34.0)
MCHC: 33.3 g/dL (ref 30.0–36.0)
MCV: 85.4 fL (ref 78.0–100.0)
Monocytes Absolute: 0.7 10*3/uL (ref 0.1–1.0)
Monocytes Relative: 4 %
Neutro Abs: 11.8 10*3/uL — ABNORMAL HIGH (ref 1.7–7.7)
Neutrophils Relative %: 81 %
PLATELETS: 317 10*3/uL (ref 150–400)
RBC: 3.23 MIL/uL — AB (ref 3.87–5.11)
RDW: 16.8 % — ABNORMAL HIGH (ref 11.5–15.5)
WBC: 14.7 10*3/uL — AB (ref 4.0–10.5)

## 2017-10-26 LAB — GLUCOSE, CAPILLARY
GLUCOSE-CAPILLARY: 175 mg/dL — AB (ref 65–99)
GLUCOSE-CAPILLARY: 177 mg/dL — AB (ref 65–99)
GLUCOSE-CAPILLARY: 163 mg/dL — AB (ref 65–99)
GLUCOSE-CAPILLARY: 135 mg/dL — AB (ref 65–99)
GLUCOSE-CAPILLARY: 157 mg/dL — AB (ref 65–99)
Glucose-Capillary: 143 mg/dL — ABNORMAL HIGH (ref 65–99)
Glucose-Capillary: 138 mg/dL — ABNORMAL HIGH (ref 65–99)

## 2017-10-26 LAB — BASIC METABOLIC PANEL
ANION GAP: 11 (ref 5–15)
BUN: 87 mg/dL — ABNORMAL HIGH (ref 6–20)
CALCIUM: 7.8 mg/dL — AB (ref 8.9–10.3)
CO2: 24 mmol/L (ref 22–32)
Chloride: 107 mmol/L (ref 101–111)
Creatinine, Ser: 1.65 mg/dL — ABNORMAL HIGH (ref 0.44–1.00)
GFR, EST AFRICAN AMERICAN: 35 mL/min — AB (ref >60)
GFR, EST NON AFRICAN AMERICAN: 30 mL/min — AB (ref >60)
Glucose, Bld: 193 mg/dL — ABNORMAL HIGH (ref 65–99)
Potassium: 3.5 mmol/L (ref 3.5–5.1)
SODIUM: 142 mmol/L (ref 135–145)

## 2017-10-26 LAB — PHOSPHORUS: PHOSPHORUS: 4 mg/dL (ref 2.5–4.6)

## 2017-10-26 MED ORDER — TRACE MINERALS CR-CU-MN-SE-ZN 10-1000-500-60 MCG/ML IV SOLN
INTRAVENOUS | Status: AC
  Administered 2017-10-26: 19:00:00 via INTRAVENOUS
  Filled 2017-10-26: qty 1992

## 2017-10-26 MED ORDER — MIDAZOLAM HCL 2 MG/2ML IJ SOLN
INTRAMUSCULAR | Status: AC
  Filled 2017-10-26: qty 6

## 2017-10-26 MED ORDER — FAT EMULSION 20 % IV EMUL
240.0000 mL | INTRAVENOUS | Status: AC
  Administered 2017-10-26: 240 mL via INTRAVENOUS
  Filled 2017-10-26: qty 250

## 2017-10-26 MED ORDER — SODIUM CHLORIDE 0.9% FLUSH
5.0000 mL | Freq: Three times a day (TID) | INTRAVENOUS | Status: DC
  Administered 2017-10-26 – 2017-11-05 (×22): 5 mL via INTRAVENOUS
  Administered 2017-11-05: 21:00:00 via INTRAVENOUS
  Administered 2017-11-05 – 2017-11-06 (×3): 5 mL via INTRAVENOUS

## 2017-10-26 MED ORDER — MIDAZOLAM HCL 2 MG/2ML IJ SOLN
INTRAMUSCULAR | Status: AC | PRN
  Administered 2017-10-26 (×2): 1 mg via INTRAVENOUS

## 2017-10-26 MED ORDER — POTASSIUM CHLORIDE 10 MEQ/50ML IV SOLN
10.0000 meq | INTRAVENOUS | Status: AC
  Administered 2017-10-26 (×2): 10 meq via INTRAVENOUS
  Filled 2017-10-26 (×2): qty 50

## 2017-10-26 MED ORDER — FENTANYL CITRATE (PF) 2500 MCG/50ML IJ SOLN
0.0000 ug/h | INTRAMUSCULAR | Status: DC
  Administered 2017-10-26: 25 ug/h via INTRAVENOUS
  Administered 2017-10-27 (×2): 200 ug/h via INTRAVENOUS
  Administered 2017-10-28 (×2): 300 ug/h via INTRAVENOUS
  Administered 2017-10-29: 200 ug/h via INTRAVENOUS
  Administered 2017-10-29: 125 ug/h via INTRAVENOUS
  Filled 2017-10-26 (×8): qty 50

## 2017-10-26 MED ORDER — LABETALOL HCL 5 MG/ML IV SOLN
5.0000 mg | INTRAVENOUS | Status: AC | PRN
  Administered 2017-10-26 (×2): 5 mg via INTRAVENOUS
  Filled 2017-10-26 (×2): qty 4

## 2017-10-26 NOTE — Progress Notes (Signed)
Nutrition Follow-up  INTERVENTION:   TPN per Pharmacy  Continue trickle feeds of Vital HP @ 20 ml/hr once medically able.  NUTRITION DIAGNOSIS:   Inadequate oral intake related to inability to eat as evidenced by NPO status.  Ongoing.  GOAL:   Patient will meet greater than or equal to 90% of their needs  Progressing.  MONITOR:   Vent status, Labs, Weight trends, TF tolerance, Skin, I & O's(TPN)  REASON FOR ASSESSMENT:   Consult, Ventilator Enteral/tube feeding initiation and management  ASSESSMENT:   Pt with PMH of GERD, DM, HTN, PVD, HLD, HTN presents with abdominal pain found necrosis of proximal colon. Pt is s/p emergent ex lap with extended right colectomy and application of abdominal vacuum dressing on 10/14/17. Pt required intubation r/t post op respiratory failure.   SIGNIFICANT EVENTS: 1/1- exploratory laparotomy, right colectomy with wound vac placement 1/3- small bowel ischemia, resected ischemic ileum, wound vac replaced, TPN inititated 1/5- re-exploration of abdomen with end proximal ileostomy and closure of hernia 1/9- extubated and OGT removed 1/11- ILE added. Re-intubated. Acute stroke confirmed by neurologist. 1/12 - trickle feeds started, Vital HP @ 20 ml/hr per MD  1/13 Trickle feeds started 1/12 but then stopped per surgery d/t pt's need for a drain placement. Vital HP @ 20 ml/hr was infused. TPN continues: electrolyte-free Clinimix 5/15 at 83 ml/hr and 20% ILE @ 20 ml/hr. Pt awaiting trach placement sometime this week per chart review.  Weight is now -19 lb since 1/11. No longer meets criteria for obesity.   Patient is currently intubated on ventilator support MV: 13 L/min Temp (24hrs), Avg:100.2 F (37.9 C), Min:99 F (37.2 C), Max:101.9 F (38.8 C)  Medications:  Labs reviewed: CBGs: 175-177 Mg/Phos WNL GFR: 30  1/11: -Diet advanced from NPO to CLD today at 8:46 AM; no intakes yet.  Pt now receiving Clinimix E 5/15 @ 83 mL/hr with  20% ILE @ 20 mL/hr x12 to start tonight. This regimen will provide 100 grams of protein and 1894 kcal.  -Per Dr. Anastasia Pall note about 30 minutes ago, pt with L-sided facial droop which is different from yesterday; concern for possible stroke and plan for head CT and Code Stroke.   1/10 - Pt was extubated yesterday and estimated nutrition needs updated based on this. - Weight -19 lbs/8.6 kg since 1/7.  - Current weight (187 lbs/85 kg) used in estimating needs as this weight is much closer to admission weight and in light of previously noted fluid gain.  - Pt with triple lumen PICC and is receiving Clinimix E 5/15 @ 83 mL/hr which is providing 100 grams of protein and 1414 kcal.  - Dr. Ethlyn Gallery note states possible to start TF soon; will monitor for ability to do so.  Diet Order:  Diet NPO time specified TPN (CLINIMIX) Adult without lytes TPN (CLINIMIX) Adult without lytes  EDUCATION NEEDS:   No education needs have been identified at this time  Skin:  Skin Assessment: Skin Integrity Issues: Skin Integrity Issues:: Stage II, Unstageable Stage II: back and L upper face Unstageable: unknown location Incisions: abdominal   Last BM:  1/13  Height:   Ht Readings from Last 1 Encounters:  10/21/17 5\' 4"  (1.626 m)    Weight:   Wt Readings from Last 1 Encounters:  10/26/17 168 lb 10.4 oz (76.5 kg)    Ideal Body Weight:  54.5 kg  BMI:  Body mass index is 28.95 kg/m.  Estimated Nutritional Needs:   Kcal:  1894  Protein:  110-120g  Fluid:  1.8L/day  Clayton Bibles, MS, RD, LDN [place] Dietitian Pager: [phone] After Hours Pager: [phone]

## 2017-10-26 NOTE — Progress Notes (Signed)
PULMONARY / CRITICAL CARE MEDICINE   Name: [name] MRN: [id number] DOB: May 14, 1946    ADMISSION DATE:  10/14/2017 CONSULTATION DATE:  10/14/2017  REFERRING MD:  Marlou Starks  CHIEF COMPLAINT:  Abdominal pain  HISTORY OF PRESENT ILLNESS:   72 y/o female with multiple medical problems admitted on 1/1 with abdominal pain due to ischemic colon and ileum.  Had an acute stroke during this process recognized on 1/11.  Past Medical History:  Diagnosis Date  . Anxiety   . Arthropathy, unspecified, site unspecified   . Blind loop syndrome   . Carotid artery disease ([place])    documented occlusion of left ICA  . Coronary artery disease    a. known 60-70% mid-LAD stenosis with caths in 1999, 2002, 2006, and 2012 showing stable anatomy b. low-risk NST in 10/2015  . Depression   . Diabetes insipidus ([place])   . Diabetes mellitus   . Diverticulosis   . Dizziness    chronic  . Dyslipidemia   . Dyspnea    with exertion  . Fatty liver   . GERD (gastroesophageal reflux disease)    hiatial hernia  . Hepatomegaly   . Hiatal hernia   . HTN (hypertension)   . Hyperlipidemia   . Hypertension   . Irritable bowel syndrome   . Melanosis coli   . Metatarsal fracture right  . Normal nuclear stress test    nml 01/03/10;06/19/07  . Other, mixed, or unspecified nondependent drug abuse, unspecified   . Pancreatitis, chronic ([place])   . Peripheral vascular disease ([place]) 02/20/10   s/p stent of left SFA  . Sleep apnea   . Stroke ([place])   . Thyroid nodule   . Unspecified essential hypertension   . Vitamin B12 deficiency      SUBJECTIVE:  Fever CT abdomen/pelvis: abscess Moving around well Blood pressure has been labile  VITAL SIGNS: BP (!) 110/38   Pulse 83   Temp 100.2 F (37.9 C) (Axillary)   Resp (!) 25   Ht 5\' 4"  (1.626 m)   Wt 76.5 kg (168 lb 10.4 oz)   SpO2 98%   BMI 28.95 kg/m   HEMODYNAMICS:    VENTILATOR SETTINGS: Vent Mode: PRVC FiO2 (%):  [30 %-100 %] 30 % Set Rate:  [14 bmp]  14 bmp Vt Set:  [500 mL] 500 mL PEEP:  [5 cmH20] 5 cmH20 Pressure Support:  [5 cmH20] 5 cmH20 Plateau Pressure:  [16 cmH20-19 cmH20] 16 cmH20  INTAKE / OUTPUT: I/O last 3 completed shifts: In: 6269 [I.V.:4890; Other:40; NG/GT:260; IV Piggyback:600] Out: 4900 [SWNIO:2703; Emesis/NG output:50; JKKXF:8182]  PHYSICAL EXAMINATION:  General:  In bed on vent HENT: NCAT ETT in place PULM: CTA B, vent supported breathing CV: RRR, no mgr GI: bowel sounds noted, midline wound vac, ostomy intact MSK: normal bulk and tone Neuro: sleepy, sedated but will wake up, move both arms well    LABS:  BMET Recent Labs  Lab 10/24/17 0555 10/25/17 0440 10/26/17 0525  NA 141 143 142  K 2.7* 3.2* 3.5  CL 104 104 107  CO2 29 26 24   BUN 80* 86* 87*  CREATININE 1.55* 1.80* 1.65*  GLUCOSE 165* 136* 193*    Electrolytes Recent Labs  Lab 10/24/17 0555 10/25/17 0440 10/26/17 0525  CALCIUM 8.4* 8.1* 7.8*  MG 1.9 1.8 2.0  PHOS 4.2 5.0* 4.0    CBC Recent Labs  Lab 10/20/17 0500 10/23/17 0438 10/26/17 0525  WBC 23.1* 18.5* 14.7*  HGB 10.2* 10.4* 9.2*  HCT 30.3* 29.9* 27.6*  PLT 151 205 317    Coag's No results for input(s): APTT, INR in the last 168 hours.  Sepsis Markers Recent Labs  Lab 10/25/17 2312  LATICACIDVEN 1.5    ABG Recent Labs  Lab 10/24/17 1600  PHART 7.363  PCO2ART 49.9*  PO2ART 415*    Liver Enzymes Recent Labs  Lab 10/20/17 0500 10/22/17 0500 10/23/17 0438  AST 106* 55* 46*  ALT 127* 64* 58*  ALKPHOS 213* 160* 172*  BILITOT 1.0 1.5* 1.5*  ALBUMIN 1.4* 1.3* 1.5*    Cardiac Enzymes No results for input(s): TROPONINI, PROBNP in the last 168 hours.  Glucose Recent Labs  Lab 10/25/17 0843 10/25/17 1209 10/25/17 1618 10/25/17 2010 10/25/17 2308 10/26/17 0408  GLUCAP 153* 172* 168* 180* 198* 175*    Imaging Ct Abdomen Pelvis Wo Contrast  Result Date: 10/25/2017 CLINICAL DATA:  Abdominal pain. Fever. Abscess suspected. Exploratory  laparotomy on 10/18/2017. Resection of ischemic small bowel. Prior right hemicolectomy 10/14/2017. EXAM: CT ABDOMEN AND PELVIS WITHOUT CONTRAST TECHNIQUE: Multidetector CT imaging of the abdomen and pelvis was performed following the standard protocol without IV contrast. COMPARISON:  Plain films 10/25/2017.  Most recent CT of 10/14/2017. FINDINGS: Lower chest: bibasilar atelectasis. Bibasilar bilateral breast implants. Normal heart size. Hepatobiliary: Caudate and lateral segment left liver lobe enlargement. Normal gallbladder, without biliary ductal dilatation. Pancreas: Mild pancreatic atrophy. Spleen: Normal in size, without focal abnormality. Adrenals/Urinary Tract: Normal adrenal glands. Lower pole left renal collecting system calculi. No hydronephrosis. Foley catheter within the urinary bladder, not entirely decompressed. Stomach/Bowel: Nasogastric tube terminating at the gastric body. Long Hartmann's pouch. Underdistended rectosigmoid region. Right lower quadrant ileostomy. Otherwise normal small bowel. Vascular/Lymphatic: Aortic and branch vessel atherosclerosis. No abdominopelvic adenopathy. Reproductive: Hysterectomy.  No adnexal mass. Other: Loculated ascites is identified within the right lower abdomen, including at 8.0 x 6.9 cm on image 46/series 2. This is positioned immediately lateral to the ileostomy. Trace right-sided pelvic fluid is also identified on image 66/series 2. Nonspecific presacral edema. Minimal interloop mesenteric fluid including on image 59/series 2. Musculoskeletal: No acute osseous abnormality. IMPRESSION: 1. Status post right-sided ileostomy and long Hartmann's pouch. Loculated ascites in the right-sided abdomen, adjacent to the ostomy site, suspicious for infected ascites and possible developing abscess. Sensitivity decreased on this noncontrast exam. 2. Other areas of smaller volume pelvic and mesenteric fluid are nonspecific. 3. Coronary artery atherosclerosis. Aortic  Atherosclerosis (ICD10-I70.0). 4. Caudate and lateral segment left liver lobe enlargement. Cannot exclude mild cirrhosis. 5. Left nephrolithiasis. Electronically Signed   By: Abigail Miyamoto M.D.   On: 10/25/2017 17:13   Dg Abd 1 View  Result Date: 10/25/2017 CLINICAL DATA:  NG tube placement. EXAM: ABDOMEN - 1 VIEW COMPARISON:  CT abdomen 10/14/2017 FINDINGS: The NG tube tip is in the body region of the stomach. The proximal port is in the fundal region. Unremarkable visualized bowel gas pattern. The bony structures appear normal. IMPRESSION: The NG tube is in good position with the tip in the body region of the stomach. Electronically Signed   By: Marijo Sanes M.D.   On: 10/25/2017 16:03   Dg Chest Port 1 View  Result Date: 10/26/2017 CLINICAL DATA:  Acute respiratory failure with hypoxemia. EXAM: PORTABLE CHEST 1 VIEW COMPARISON:  One-view chest x-ray 10/25/2017. FINDINGS: Endotracheal tube terminates 4 cm above the carina, stable. Right-sided PICC line is stable. The side port of the NG tube is in the stomach. Aeration of both lungs is improved. No focal  airspace disease is present. Lung volumes remain low. Atherosclerotic changes are again seen at the aortic arch. IMPRESSION: 1. Improved aeration of both lungs with persistent low lung volumes. 2. No focal airspace disease. 3. Support apparatus is stable. 4.  Aortic Atherosclerosis (ICD10-I70.0). Electronically Signed   By: San Morelle M.D.   On: 10/26/2017 06:55     STUDIES:  1/1 CT abdomen/pelvis: diffuse pneumatosis in cecum and ascending colon with distension, bibasilar airspace opacities, aortic calcification, non-obstructing left kidney stone Jan 11 CT head > NAICP January 11 MRI/MRA brain: Acute cerebral infarction bilateral left greater than right, left parietal and left temporal lobe.  Small acute infarct right frontal parietal white hemorrhage, occluded left common carotid. 1/12 CT Ab/pelvis> loculated fluid collection along R  abdominal wall; cannot exclude cirrhosis, liver lobe enlargement  CULTURES: 1/1 Blood > neg 1/1 urine > neg 1/7 blood > neg 1/7 cath tip > neg 1/11 blood >   ANTIBIOTICS: 12/31 zosyn > 1/9 1/4 Anidulafungin >  1/9 1/11 Meropenem >   SIGNIFICANT EVENTS: 1/1 ex lap, R colectomy 1/3 ex lap, partial omentectomy, resection of ischemic ileum (130cm)  1/5 Ex lap, abdominal wall closure 1/9 Extubation 1/11 Acute CVA, reintubated  EVENTS   LINES/TUBES: 1/1 ETT > 1/9, 1/11 >  1/1 R IJ CVL > 1/8 1/7 PICC >   DISCUSSION: 72 y/o female with multiple medical problems admitted with ischemic bowel.  Required bowel resection, exploratory laparotomy x3 closed on January 5.  Acute CVA recognized on January 11.  This is believed to be a watershed infarct related to severe sepsis on admission with ischemic bowel and bowel perforation.  Reintubated January 11, fever afterwards> CT abdomen 1/12 showed a likely R abdominal wall abscess.  ASSESSMENT / PLAN:  NEUROLOGIC A:   Acute bilateral CVA > felt to be due to hypotension in setting of septic shock on presentation; symptoms have waxed and waned with fever, BP changes; neuro signed off 1/13 Need for sedation for mechanical vent synchrony P:   RASS goal: 0 to -1 D/c precedex as pressure labile with that Use fentanyl infusion (wasn't comfortable on intermittent fentanyl protocol) Monitor neuro exam Continue ASA   PULMONARY A: Acute respiratory failure with hypoxemia P:   Full mechanical vent support VAP prevention Daily WUA/SBT Consider extubation attempt on 1/14, if fails or not a candidate then would perform tracheostomy (family understands this will likely be necessary)  CARDIOVASCULAR A:  Known CAD Hypertension Septic shock now resolved P:  Tele Monitor hemodynamics  RENAL A:   AKI > better on 1/13 P:   Monitor BMET and UOP Replace electrolytes as needed Continue to hold lasix KVO fluids  GASTROINTESTINAL A:    Shock liver> resolved Ischemic bowel> resected, belly close Profound protein calorie malnutrition Abdominal abscess P:   Continue trickle feeds IR drain? Will defer to surgery  HEMATOLOGIC A:   Mild anemia without bleeding P:  Monitor for bleeding  INFECTIOUS A:   Ischemic colon/bowel with peritonitis s/p washout New fever 1/12 > presumably due to fluid collection in abdomen, CXR clear, blood cultures negative, lines relatively new P:   Continue meropenem IR drain? Will defer to surgery  ENDOCRINE A:   DM2 with hyperglycemia  P:   SSI   FAMILY  - Updates: I updated her son Elta [place] [phone] by phone 1/13  - Inter-disciplinary family meet or Palliative Care meeting due by:  day 7  My cc time 35 minutes  Roselie Awkward, MD [place] [facility] Pager:  [phone] Cell: [phone] After 3pm or if no response, call [phone]   10/26/2017, 7:58 AM

## 2017-10-26 NOTE — Progress Notes (Signed)
Patient experiencing a small amount of bright red drainage from the rectum.  Dr. Lake Bells updated on patients change in condition, received no new orders at this time.  Will continue to monitor.

## 2017-10-26 NOTE — Progress Notes (Signed)
8 Days Post-Op   Subjective/Chief Complaint: Intubated and sedated   Objective: Vital signs in last 24 hours: Temp:  [99 F (37.2 C)-101.9 F (38.8 C)] 100 F (37.8 C) (01/13 0800) Pulse Rate:  [40-115] 83 (01/13 0715) Resp:  [13-25] 25 (01/13 0715) BP: (62-220)/(33-119) 110/38 (01/13 0715) SpO2:  [93 %-100 %] 98 % (01/13 0715) FiO2 (%):  [30 %-100 %] 30 % (01/13 0405) Weight:  [76.5 kg (168 lb 10.4 oz)] 76.5 kg (168 lb 10.4 oz) (01/13 0615) Last BM Date: 10/25/17  Intake/Output from previous day: 01/12 0701 - 01/13 0700 In: 4330.1 [I.V.:3570.1; NG/GT:260; IV Piggyback:500] Out: 3100 [Urine:2025; Emesis/NG output:50; Stool:1025] Intake/Output this shift: Total I/O In: -  Out: 150 [Urine:150]  General appearance: intubated and sedated Resp: clear to auscultation bilaterally and on vent Cardio: regular rate and rhythm GI: soft, nontender. ostomy viable with some output  Lab Results:  Recent Labs    10/26/17 0525  WBC 14.7*  HGB 9.2*  HCT 27.6*  PLT 317   BMET Recent Labs    10/25/17 0440 10/26/17 0525  NA 143 142  K 3.2* 3.5  CL 104 107  CO2 26 24  GLUCOSE 136* 193*  BUN 86* 87*  CREATININE 1.80* 1.65*  CALCIUM 8.1* 7.8*   PT/INR No results for input(s): LABPROT, INR in the last 72 hours. ABG Recent Labs    10/24/17 1600  PHART 7.363  HCO3 27.7    Studies/Results: Ct Abdomen Pelvis Wo Contrast  Result Date: 10/25/2017 CLINICAL DATA:  Abdominal pain. Fever. Abscess suspected. Exploratory laparotomy on 10/18/2017. Resection of ischemic small bowel. Prior right hemicolectomy 10/14/2017. EXAM: CT ABDOMEN AND PELVIS WITHOUT CONTRAST TECHNIQUE: Multidetector CT imaging of the abdomen and pelvis was performed following the standard protocol without IV contrast. COMPARISON:  Plain films 10/25/2017.  Most recent CT of 10/14/2017. FINDINGS: Lower chest: bibasilar atelectasis. Bibasilar bilateral breast implants. Normal heart size. Hepatobiliary: Caudate and  lateral segment left liver lobe enlargement. Normal gallbladder, without biliary ductal dilatation. Pancreas: Mild pancreatic atrophy. Spleen: Normal in size, without focal abnormality. Adrenals/Urinary Tract: Normal adrenal glands. Lower pole left renal collecting system calculi. No hydronephrosis. Foley catheter within the urinary bladder, not entirely decompressed. Stomach/Bowel: Nasogastric tube terminating at the gastric body. Long Hartmann's pouch. Underdistended rectosigmoid region. Right lower quadrant ileostomy. Otherwise normal small bowel. Vascular/Lymphatic: Aortic and branch vessel atherosclerosis. No abdominopelvic adenopathy. Reproductive: Hysterectomy.  No adnexal mass. Other: Loculated ascites is identified within the right lower abdomen, including at 8.0 x 6.9 cm on image 46/series 2. This is positioned immediately lateral to the ileostomy. Trace right-sided pelvic fluid is also identified on image 66/series 2. Nonspecific presacral edema. Minimal interloop mesenteric fluid including on image 59/series 2. Musculoskeletal: No acute osseous abnormality. IMPRESSION: 1. Status post right-sided ileostomy and long Hartmann's pouch. Loculated ascites in the right-sided abdomen, adjacent to the ostomy site, suspicious for infected ascites and possible developing abscess. Sensitivity decreased on this noncontrast exam. 2. Other areas of smaller volume pelvic and mesenteric fluid are nonspecific. 3. Coronary artery atherosclerosis. Aortic Atherosclerosis (ICD10-I70.0). 4. Caudate and lateral segment left liver lobe enlargement. Cannot exclude mild cirrhosis. 5. Left nephrolithiasis. Electronically Signed   By: Abigail Miyamoto M.D.   On: 10/25/2017 17:13   Dg Abd 1 View  Result Date: 10/25/2017 CLINICAL DATA:  NG tube placement. EXAM: ABDOMEN - 1 VIEW COMPARISON:  CT abdomen 10/14/2017 FINDINGS: The NG tube tip is in the body region of the stomach. The proximal port is in the  fundal region. Unremarkable  visualized bowel gas pattern. The bony structures appear normal. IMPRESSION: The NG tube is in good position with the tip in the body region of the stomach. Electronically Signed   By: Marijo Sanes M.D.   On: 10/25/2017 16:03   [name] Head Wo Contrast  Result Date: 10/24/2017 CLINICAL DATA:  Left facial droop.  Stroke. EXAM: MRI HEAD WITHOUT CONTRAST MRA HEAD WITHOUT CONTRAST MRA NECK WITHOUT CONTRAST TECHNIQUE: Multiplanar, multiecho pulse sequences of the brain and surrounding structures were obtained without intravenous contrast. Angiographic images of the Circle of Willis were obtained using MRA technique without intravenous contrast. Angiographic images of the neck were obtained using MRA technique without intravenous contrast. Carotid stenosis measurements (when applicable) are obtained utilizing NASCET criteria, using the distal internal carotid diameter as the denominator. COMPARISON:  CT head 10/24/2017 FINDINGS: MRI HEAD FINDINGS Brain: Image quality degraded by motion. Acute infarcts bilaterally, left greater than right. The largest area of infarction is in the left parietal white matter. This area shows well-circumscribed hypodensity on CT suggesting this may be subacute. Additional smaller areas of acute infarct in the left temporal lobe also noted to be hypodense on CT. Small area of acute infarct in the right frontal parietal white matter. Negative for hemorrhage. Negative for hydrocephalus. Negative for hemorrhage mass or midline shift. Vascular: Loss of flow void in the left internal carotid artery compatible with occlusion. Normal flow void right internal carotid artery and posterior circulation. Skull and upper cervical spine: Negative Sinuses/Orbits: Paranasal sinuses clear.  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 Right internal carotid artery patent  without stenosis. High-grade stenosis distal left M1 segment. Both anterior cerebral arteries patent. Right middle cerebral artery is patent with irregularity which may be due to motion Both vertebral arteries are patent. The basilar is patent. Posterior cerebral arteries patent bilaterally. 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 Right carotid artery is patent. Mild narrowing of the right carotid bifurcation. Both vertebral arteries are patent to the basilar. IMPRESSION: Acute cerebral infarction bilaterally left greater than right. Acute infarcts in the left parietal white matter and left temporal lobe show hypodensity on CT suggesting these may be subacute. Small area of acute infarct in the right frontal parietal white matter. 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 Given the acute infarcts, this may be acute. Severe stenosis left M1 Mild stenosis right carotid bifurcation. Image quality degraded by motion. These results were called by telephone at the time of interpretation on 10/24/2017 at 12:37 pm to Dr. Lake Bells, who verbally acknowledged these results. Electronically Signed   By: Franchot Gallo M.D.   On: 10/24/2017 12:38   [name] Neck Wo Contrast  Result Date: 10/24/2017 CLINICAL DATA:  Left facial droop.  Stroke. EXAM: MRI HEAD WITHOUT CONTRAST MRA HEAD WITHOUT CONTRAST MRA NECK WITHOUT CONTRAST TECHNIQUE: Multiplanar, multiecho pulse sequences of the brain and surrounding structures were obtained without intravenous contrast. Angiographic images of the Circle of Willis were obtained using MRA technique without intravenous contrast. Angiographic images of the neck were obtained using MRA technique without intravenous contrast. Carotid stenosis measurements (when applicable) are obtained utilizing  NASCET criteria, using the distal internal carotid diameter as the denominator. COMPARISON:  CT head 10/24/2017  FINDINGS: MRI HEAD FINDINGS Brain: Image quality degraded by motion. Acute infarcts bilaterally, left greater than right. The largest area of infarction is in the left parietal white matter. This area shows well-circumscribed hypodensity on CT suggesting this may be subacute. Additional smaller areas of acute infarct in the left temporal lobe also noted to be hypodense on CT. Small area of acute infarct in the right frontal parietal white matter. Negative for hemorrhage. Negative for hydrocephalus. Negative for hemorrhage mass or midline shift. Vascular: Loss of flow void in the left internal carotid artery compatible with occlusion. Normal flow void right internal carotid artery and posterior circulation. Skull and upper cervical spine: Negative Sinuses/Orbits: Paranasal sinuses clear.  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 Right internal carotid artery patent without stenosis. High-grade stenosis distal left M1 segment. Both anterior cerebral arteries patent. Right middle cerebral artery is patent with irregularity which may be due to motion Both vertebral arteries are patent. The basilar is patent. Posterior cerebral arteries patent bilaterally. 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 Right carotid artery is patent. Mild narrowing of the right carotid bifurcation. Both vertebral arteries are patent to the basilar. IMPRESSION: Acute cerebral infarction bilaterally left greater than right. Acute infarcts in the left parietal white matter and left temporal lobe show hypodensity on CT suggesting these may be subacute. Small area of acute  infarct in the right frontal parietal white matter. 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 Given the acute infarcts, this may be acute. Severe stenosis left M1 Mild stenosis right carotid bifurcation. Image quality degraded by motion. These results were called by telephone at the time of interpretation on 10/24/2017 at 12:37 pm to Dr. Lake Bells, who verbally acknowledged these results. Electronically Signed   By: Franchot Gallo M.D.   On: 10/24/2017 12:38   Mr Brain Wo Contrast  Result Date: 10/24/2017 CLINICAL DATA:  Left facial droop.  Stroke. EXAM: MRI HEAD WITHOUT CONTRAST MRA HEAD WITHOUT CONTRAST MRA NECK WITHOUT CONTRAST TECHNIQUE: Multiplanar, multiecho pulse sequences of the brain and surrounding structures were obtained without intravenous contrast. Angiographic images of the Circle of Willis were obtained using MRA technique without intravenous contrast. Angiographic images of the neck were obtained using MRA technique without intravenous contrast. Carotid stenosis measurements (when applicable) are obtained utilizing NASCET criteria, using the distal internal carotid diameter as the denominator. COMPARISON:  CT head 10/24/2017 FINDINGS: MRI HEAD FINDINGS Brain: Image quality degraded by motion. Acute infarcts bilaterally, left greater than right. The largest area of infarction is in the left parietal white matter. This area shows well-circumscribed hypodensity on CT suggesting this may be subacute. Additional smaller areas of acute infarct in the left temporal lobe also noted to be hypodense on CT. Small area of acute infarct in the right frontal parietal white matter. Negative for hemorrhage. Negative for hydrocephalus. Negative for hemorrhage mass or midline shift. Vascular: Loss of flow void in the left internal carotid artery compatible with occlusion. Normal flow void right internal carotid artery and posterior circulation.  Skull and upper cervical spine: Negative Sinuses/Orbits: Paranasal sinuses clear.  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 Right internal carotid  artery patent without stenosis. High-grade stenosis distal left M1 segment. Both anterior cerebral arteries patent. Right middle cerebral artery is patent with irregularity which may be due to motion Both vertebral arteries are patent. The basilar is patent. Posterior cerebral arteries patent bilaterally. 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 Right carotid artery is patent. Mild narrowing of the right carotid bifurcation. Both vertebral arteries are patent to the basilar. IMPRESSION: Acute cerebral infarction bilaterally left greater than right. Acute infarcts in the left parietal white matter and left temporal lobe show hypodensity on CT suggesting these may be subacute. Small area of acute infarct in the right frontal parietal white matter. 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 Given the acute infarcts, this may be acute. Severe stenosis left M1 Mild stenosis right carotid bifurcation. Image quality degraded by motion. These results were called by telephone at the time of interpretation on 10/24/2017 at 12:37 pm to Dr. Lake Bells, who verbally acknowledged these results. Electronically Signed   By: Franchot Gallo M.D.   On: 10/24/2017 12:38   Dg Chest Port 1 View  Result Date: 10/26/2017 CLINICAL DATA:  Acute respiratory failure with hypoxemia. EXAM: PORTABLE CHEST 1 VIEW COMPARISON:  One-view chest x-ray 10/25/2017. FINDINGS: Endotracheal tube terminates 4 cm above the carina, stable. Right-sided PICC line is stable. The side  port of the NG tube is in the stomach. Aeration of both lungs is improved. No focal airspace disease is present. Lung volumes remain low. Atherosclerotic changes are again seen at the aortic arch. IMPRESSION: 1. Improved aeration of both lungs with persistent low lung volumes. 2. No focal airspace disease. 3. Support apparatus is stable. 4.  Aortic Atherosclerosis (ICD10-I70.0). Electronically Signed   By: San Morelle M.D.   On: 10/26/2017 06:55   Portable Chest Xray  Result Date: 10/25/2017 CLINICAL DATA:  Acute respiratory failure. EXAM: PORTABLE CHEST 1 VIEW COMPARISON:  One-view chest x-ray 10/24/2017 FINDINGS: Endotracheal tube is stable. NG tube courses off the inferior border the film. A right-sided PICC line is stable. Atherosclerotic changes are present at the aortic arch. Lung volumes are low. Mild pulmonary vascular congestion is present. No focal airspace consolidation is present. There is no pneumothorax. IMPRESSION: 1. Low lung volumes and mild atelectasis without focal airspace disease otherwise. 2. Support apparatus is stable. Electronically Signed   By: San Morelle M.D.   On: 10/25/2017 07:20   Portable Chest Xray  Result Date: 10/24/2017 CLINICAL DATA:  Ventilator support.  Stroke. EXAM: PORTABLE CHEST 1 VIEW COMPARISON:  10/22/2017 FINDINGS: Endotracheal tube tip is 3 cm above the carina. Nasogastric tube enters the abdomen. The lungs are clear with improved aeration at the bases compared to the previous study. No worsening or new finding. Right arm PICC tip at the SVC RA junction. IMPRESSION: Lines and tubes well positioned. Better aeration the lower lungs when compared to the prior study. Electronically Signed   By: Nelson Chimes M.D.   On: 10/24/2017 15:36   Ct Head Code Stroke Wo Contrast`  Result Date: 10/24/2017 CLINICAL DATA:  Code stroke.  Left facial droop EXAM: CT HEAD WITHOUT CONTRAST TECHNIQUE: Contiguous axial images were obtained from the base of the  skull through the vertex without intravenous contrast. COMPARISON:  None. FINDINGS: Brain: Ventricle size normal. Mild atrophy. Chronic infarct left parietal white matter. Probable chronic subinsular infarct  on the left. Negative for acute infarct.  Negative for hemorrhage or mass. Vascular: Negative for hyperdense vessel.  Atherosclerotic disease. Skull: Negative Sinuses/Orbits: Negative sinuses.  Bilateral cataract removal. Other: None ASPECTS ([place] Stroke Program Early CT Score) - Ganglionic level infarction (caudate, lentiform nuclei, internal capsule, insula, M1-M3 cortex): 7 - Supraganglionic infarction (M4-M6 cortex): 3 Total score (0-10 with 10 being normal): 10 IMPRESSION: 1. No acute abnormality 2. ASPECTS is 10 3. These results were called by telephone at the time of interpretation on 10/24/2017 at 11:11 am to Dr. Simonne Maffucci , who verbally acknowledged these results. Electronically Signed   By: Franchot Gallo M.D.   On: 10/24/2017 11:11    Anti-infectives: Anti-infectives (From admission, onward)   Start     Dose/Rate Route Frequency Ordered Stop   10/25/17 1000  meropenem (MERREM) 1 g in sodium chloride 0.9 % 100 mL IVPB     1 g 200 mL/hr over 30 Minutes Intravenous Every 12 hours 10/24/17 1547     10/24/17 1600  meropenem (MERREM) 2 g in sodium chloride 0.9 % 100 mL IVPB     2 g 200 mL/hr over 30 Minutes Intravenous  Once 10/24/17 1547 10/24/17 1647   10/17/17 1200  anidulafungin (ERAXIS) 100 mg in sodium chloride 0.9 % 100 mL IVPB     100 mg 78 mL/hr over 100 Minutes Intravenous Every 24 hours 10/16/17 1110 10/22/17 1423   10/16/17 1200  anidulafungin (ERAXIS) 200 mg in sodium chloride 0.9 % 200 mL IVPB     200 mg 78 mL/hr over 200 Minutes Intravenous  Once 10/16/17 1110 10/16/17 1659   10/16/17 0200  piperacillin-tazobactam (ZOSYN) IVPB 3.375 g     3.375 g 12.5 mL/hr over 240 Minutes Intravenous Every 8 hours 10/15/17 2030 10/22/17 2121   10/15/17 1000   piperacillin-tazobactam (ZOSYN) IVPB 2.25 g  Status:  Discontinued     2.25 g 100 mL/hr over 30 Minutes Intravenous Every 8 hours 10/15/17 0823 10/15/17 2028   10/14/17 1000  piperacillin-tazobactam (ZOSYN) IVPB 3.375 g  Status:  Discontinued     3.375 g 12.5 mL/hr over 240 Minutes Intravenous Every 8 hours 10/14/17 0805 10/15/17 0823   10/14/17 0315  piperacillin-tazobactam (ZOSYN) IVPB 3.375 g     3.375 g 100 mL/hr over 30 Minutes Intravenous  Once 10/14/17 0302 10/14/17 0440   10/14/17 0315  vancomycin (VANCOCIN) IVPB 1000 mg/200 mL premix     1,000 mg 200 mL/hr over 60 Minutes Intravenous  Once 10/14/17 0303 10/14/17 0440      Assessment/Plan: s/p Procedure(s): RE-EXPLORATION OF ABDOMEN, ILEOSTOMY CREATION (N/A) will hold tube feed until intraabdominal fluid is aspirated or drained  Consult IR for perc drain POD 8 from bowel resection and ileostomy for ischemia Stroke per neurology VDRF and critical care per CCM  LOS: 12 days    TOTH III,PAUL S 10/26/2017

## 2017-10-26 NOTE — Procedures (Signed)
RLQ abscess drain 12 Fr Dark fluid aspirated and sent for Cx EBL 0 Comp 0

## 2017-10-26 NOTE — Progress Notes (Signed)
[name] for TPN  Indication: extensive large and small bowel resection  Patient Measurements: Height: '5\' 4"'  (162.6 cm) Weight: 168 lb 10.4 oz (76.5 kg) IBW/kg (Calculated) : 54.7 TPN AdjBW (KG): 60.8 Body mass index is 28.95 kg/m.  Insulin Requirements: Hx of DM. 18 Units SSI/24 hrs,  Reg insulin 25 units  Current Nutrition:  TPN at goal, started clears 1/11  IVF: discontinued 1/6  Central access: CVC from 1/1-1/7, PICC triple lumen 1/7- TPN start date: 1/3  ASSESSMENT                                                                                                          HPI: 60 yoF to ED 1/1 with abd pain, pneumatosis R colon > surgery. Hx of CAD, DM, Diverticulosis, HPL, HTN, chronic pancreatitis noted CVA  Significant events:  1/1 OR exp lap. R colectomy, wound vac 1/3 return to OR- expl lap > sm bowel ischemia, resected ischemic ileum > VAC replaced. Begin TPN  1/5 OR Re-exploration of abdomen with end proximal ileostomy and closure of abdominal wall hernia 1/7 CVC removed, cath tip cultured, blood cultured for persistent fever. PICC placed for TPN. 1/11 acute stroke, intubated 1/12 start trickle feeds, cont TPN for nutritional support  Today:   Glucose - DM on Victoza PTA, CBGs continue to be above goal of 150  Electrolytes - K+ 3.5,  Mag 2.0 ,Phos WNL CorrCa WNL  Renal - SCr 1.65, making urine on lasix, I/O -1.6L, NG out. Weight up, volume overloaded. BUN high, continue with current protein goal in setting of critical illness.  LFTs - AST/ALT elevated but coming down, Alk Phos elevated. Bilirubin now increased  TGs - were 349 on 1/1, Propofol d/c 1/2, Trig 366 (1/4), 168 (1/7), 198 (1/12)  Prealbumin - < 5  remains low 2nd critical illness/inflammation  NUTRITIONAL GOALS                                                                                             RD recs: 10/17/17 Kcal [id number] , protein  109-119 gm  Hold 20% lipid emulsion for first 7 days for ICU patients per ASPEN guidelines (Start date 10/17/17)  Clinimix 5/15 at a goal rate of 83 ml/hr to provide:1414 kcal and 100 gm protein.  111% kcal and 92 % protein.   PLAN   KCl 60mq x2  At 1800 today:  Continue electrolyte-free Clinimix 5/15 at 83 ml/hr, consider changing back to E formula if phos remains low    continue 20% lipid emulsion at 20 mls/hr for 12 hours (held for first 7 days for ICU patients per ASPEN guidelines)  increase regular insulin in TPN so that patient will receive 30 units over the 24 hour bag.  TPN to contain standard multivitamins and trace elements.  Continue SSI moderate scale q4h  TPN lab panels on Mondays & Thursdays.    F/u daily.  Dolly Rias RPh 10/26/2017, 8:20 AM Pager [phone]

## 2017-10-26 NOTE — Consult Note (Signed)
Chief Complaint: Patient was seen in consultation today for CT guided aspiration/possible drainage of abdomino-pelvic fluid collection Chief Complaint  Patient presents with  . Altered Mental Status    Referring Physician(s): Toth,P  Supervising Physician: Marybelle Killings  Patient Status: [facility] - In-pt  History of Present Illness: [name] is a 72 y.o. female with multiple medical problems as listed below recently admitted with abdominal pain due to ischemic colon and ileum, s/p exploratory laparotomy with partial omentectomy and resection of ischemic colon/ application of wound VAC on 10/16/17.  She underwent reexploration of abdomen with end proximal ileostomy and closure of abdominal wall hernia on 1/5.  On 1/11 patient was noted to have left facial droop and finding of acute cerebral infarction bilaterally left greater than right as well as acute infarction of left parietal white matter lobe, small area of acute infarct in the right frontoparietal white matter.  She also has respiratory failure and is currently intubated.  Due to continued abdominal pain and fever patient underwent CT scan yesterday which revealed loculated ascites in right abdomen adjacent to ostomy site suspicious for possible abscess.  Request now received  received from surgery for CT-guided aspiration/possible drain of above-noted fluid collection.  Past Medical History:  Diagnosis Date  . Anxiety   . Arthropathy, unspecified, site unspecified   . Blind loop syndrome   . Carotid artery disease ([place])    documented occlusion of left ICA  . Coronary artery disease    a. known 60-70% mid-LAD stenosis with caths in 1999, 2002, 2006, and 2012 showing stable anatomy b. low-risk NST in 10/2015  . Depression   . Diabetes insipidus ([place])   . Diabetes mellitus   . Diverticulosis   . Dizziness    chronic  . Dyslipidemia   . Dyspnea    with exertion  . Fatty liver   . GERD (gastroesophageal reflux disease)    hiatial hernia  . Hepatomegaly   . Hiatal hernia   . HTN (hypertension)   . Hyperlipidemia   . Hypertension   . Irritable bowel syndrome   . Melanosis coli   . Metatarsal fracture right  . Normal nuclear stress test    nml 01/03/10;06/19/07  . Other, mixed, or unspecified nondependent drug abuse, unspecified   . Pancreatitis, chronic ([place])   . Peripheral vascular disease ([place]) 02/20/10   s/p stent of left SFA  . Sleep apnea   . Stroke ([place])   . Thyroid nodule   . Unspecified essential hypertension   . Vitamin B12 deficiency     Past Surgical History:  Procedure Laterality Date  . ABDOMINAL HYSTERECTOMY     partial  . AUGMENTATION MAMMAPLASTY     bilateral 1976 retro   . BREAST ENHANCEMENT SURGERY    . CARDIAC CATHETERIZATION  07/07/98;08/31/01;03/04/05   60 -70% LAD  . CATARACT EXTRACTION    . COLON RESECTION N/A 10/14/2017   Procedure: EXPLORATORY LAPAROTOMY, EXTENDED RIGHT COLECTOMY; APPLICATION OF ABDOMINAL VACUUM DRESSING;  Surgeon: Jovita Kussmaul, MD;  Location: [facility];  Service: General;  Laterality: N/A;  . ENDOBRONCHIAL ULTRASOUND Bilateral 02/24/2017   Procedure: ENDOBRONCHIAL ULTRASOUND;  Surgeon: Collene Gobble, MD;  Location: [facility] END[facility];  Service: Cardiopulmonary;  Laterality: Bilateral;  . EYE SURGERY Left Nov. 2016   Cataract  . EYE SURGERY Right 2001   Cataract  . FEMORAL ARTERY STENT     Left leg  . LAPAROTOMY N/A 10/16/2017   Procedure: EXPLORATORY LAPAROTOMY, PARTIAL OMENTECTOMY, RESECTION ISCHEMIC ILEUM  124PY, APPLICATION OF VAC ABDOMINAL DRESSING;  Surgeon: Armandina Gemma, MD;  Location: [facility];  Service: General;  Laterality: N/A;  . LAPAROTOMY N/A 10/18/2017   Procedure: RE-EXPLORATION OF ABDOMEN, ILEOSTOMY CREATION;  Surgeon: Stark Klein, MD;  Location: [facility];  Service: General;  Laterality: N/A;  . LUNG SURGERY  03/2017   Benign polyps removed  . PATELLA REALIGNMENT Left   . TONSILLECTOMY    . TOTAL ABDOMINAL HYSTERECTOMY    . WRIST SURGERY     right     Allergies: Codeine and Iodinated diagnostic agents  Medications: Prior to Admission medications   Medication Sig Start Date End Date Taking? Authorizing Provider  ABILIFY 5 MG tablet Take 5 mg by mouth daily.  04/19/11  Yes [provider]  amLODipine (NORVASC) 10 MG tablet Take 10 mg by mouth daily.     Yes [provider]  ASPERCREME HEAT 10 % GEL Apply 1 application topically as needed (arthritis).  11/28/16  Yes [provider]  aspirin EC 81 MG tablet Take 81 mg by mouth every evening.   Yes [provider]  atorvastatin (LIPITOR) 20 MG tablet Take 20 mg by mouth daily.     Yes [provider]  Cyanocobalamin (VITAMIN B-12 PO) Take 1 tablet by mouth once a week.    Yes [provider]  dicyclomine (BENTYL) 20 MG tablet Take 10 mg by mouth 4 (four) times daily -  before meals and at bedtime.   Yes [provider]  diphenhydrAMINE (BENADRYL) 50 MG tablet Take 1 tablet (50 mg total) by mouth daily as needed for allergies. 02/24/17  Yes Collene Gobble, MD  esomeprazole (NEXIUM) 40 MG capsule Take 40 mg by mouth 2 (two) times daily.     Yes [provider]  furosemide (LASIX) 20 MG tablet Take 20 mg by mouth daily. 09/22/14  Yes [provider]  gabapentin (NEURONTIN) 300 MG capsule Take 300 mg by mouth 2 (two) times daily.    Yes [provider]  HYDROcodone-acetaminophen (NORCO/VICODIN) 5-325 MG tablet Take 1 tablet by mouth 4 (four) times daily as needed for severe pain.  01/31/17  Yes [provider]  ibuprofen (ADVIL,MOTRIN) 200 MG tablet Take 200-400 mg by mouth daily as needed for headache or moderate pain.   Yes [provider]  isosorbide mononitrate (IMDUR) 60 MG 24 hr tablet TAKE 1 TABLET BY MOUTH EVERY DAY 08/18/17  Yes Almyra Deforest, PA  loperamide (IMODIUM) 1 MG/5ML solution Take 2 mg by mouth daily as needed for diarrhea or loose stools.   Yes [provider]  losartan  (COZAAR) 100 MG tablet Take 100 mg by mouth daily.   Yes [provider]  methscopolamine (PAMINE FORTE) 5 MG tablet Take 5 mg by mouth at bedtime.     Yes [provider]  metoprolol succinate (TOPROL-XL) 100 MG 24 hr tablet Take 100 mg by mouth daily. 01/24/17  Yes [provider]  montelukast (SINGULAIR) 10 MG tablet Take 10 mg by mouth daily.     Yes [provider]  MYRBETRIQ 50 MG TB24 tablet Take 50 mg by mouth daily. 10/10/14  Yes [provider]  nitroGLYCERIN (NITROSTAT) 0.4 MG SL tablet Place 1 tablet (0.4 mg total) under the tongue every 5 (five) minutes as needed for chest pain. 11/08/15  Yes [place], Peter M, MD  Pancrelipase, Lip-Prot-Amyl, (ZENPEP) 25000-79000 units CPEP Take 2 capsules by mouth 3 (three) times daily before meals. And with snacks  01/13/17  Yes Ladene Artist, MD  rOPINIRole (REQUIP) 0.5 MG tablet Take 0.5 mg by mouth at bedtime as needed (restless legs).  11/28/16  Yes [provider]  tamsulosin (FLOMAX) 0.4 MG CAPS capsule Take 0.4 mg by mouth daily.   Yes [provider]  venlafaxine (EFFEXOR-XR) 150 MG 24 hr capsule Take 150 mg by mouth daily.     Yes [provider]  VICTOZA 18 MG/3ML SOPN Inject 1.8 mg into the skin every evening.  10/06/15  Yes [provider]  zolpidem (AMBIEN) 10 MG tablet Take 10 mg by mouth at bedtime as needed for sleep. 01/11/17  Yes [provider]     Family History  Problem Relation Age of Onset  . Hypertension Mother   . Osteoarthritis Mother   . Pulmonary embolism Mother   . Hypertension Father        aneurysm  . Stroke Father   . Stroke Unknown   . Colon cancer Paternal Grandfather     Social History   Socioeconomic History  . Marital status: Divorced    Spouse name: None  . Number of children: 2  . Years of education: None  . Highest education level: None  Social Needs  . Financial resource strain: None  . Food insecurity -  worry: None  . Food insecurity - inability: None  . Transportation needs - medical: None  . Transportation needs - non-medical: None  Occupational History  . Occupation: retired    Fish farm manager: DISABLED  Tobacco Use  . Smoking status: Current Some Day Smoker    Packs/day: 1.00    Years: 50.00    Pack years: 50.00    Types: Cigarettes    Last attempt to quit: 10/14/2010    Years since quitting: 7.0  . Smokeless tobacco: Never Used  Substance and Sexual Activity  . Alcohol use: No  . Drug use: No  . Sexual activity: None  Other Topics Concern  . None  Social History Narrative  . None      Review of Systems see above; intubated  Vital Signs: BP (!) 145/57   Pulse 81   Temp 100 F (37.8 C) (Axillary)   Resp (!) 23   Ht 5\' 4"  (1.626 m)   Wt 168 lb 10.4 oz (76.5 kg)   SpO2 97%   BMI 28.95 kg/m   Physical Exam patient on vent, does follow some commands.;  Left facial droop noted; chest clear to auscultation bilaterally anteriorly; heart with regular rate and rhythm; abdomen soft, few bowel sounds, midline wound VAC, right lower quadrant ostomy intact; no significant lower extremity edema Imaging: Ct Abdomen Pelvis Wo Contrast  Result Date: 10/25/2017 CLINICAL DATA:  Abdominal pain. Fever. Abscess suspected. Exploratory laparotomy on 10/18/2017. Resection of ischemic small bowel. Prior right hemicolectomy 10/14/2017. EXAM: CT ABDOMEN AND PELVIS WITHOUT CONTRAST TECHNIQUE: Multidetector CT imaging of the abdomen and pelvis was performed following the standard protocol without IV contrast. COMPARISON:  Plain films 10/25/2017.  Most recent CT of 10/14/2017. FINDINGS: Lower chest: bibasilar atelectasis. Bibasilar bilateral breast implants. Normal heart size. Hepatobiliary: Caudate and lateral segment left liver lobe enlargement. Normal gallbladder, without biliary ductal dilatation. Pancreas: Mild pancreatic atrophy. Spleen: Normal in size, without focal abnormality. Adrenals/Urinary  Tract: Normal adrenal glands. Lower pole left renal collecting system calculi. No hydronephrosis. Foley catheter within the urinary bladder, not entirely decompressed. Stomach/Bowel: Nasogastric tube terminating at the gastric body. Long Hartmann's pouch. Underdistended rectosigmoid region. Right lower quadrant ileostomy. Otherwise  normal small bowel. Vascular/Lymphatic: Aortic and branch vessel atherosclerosis. No abdominopelvic adenopathy. Reproductive: Hysterectomy.  No adnexal mass. Other: Loculated ascites is identified within the right lower abdomen, including at 8.0 x 6.9 cm on image 46/series 2. This is positioned immediately lateral to the ileostomy. Trace right-sided pelvic fluid is also identified on image 66/series 2. Nonspecific presacral edema. Minimal interloop mesenteric fluid including on image 59/series 2. Musculoskeletal: No acute osseous abnormality. IMPRESSION: 1. Status post right-sided ileostomy and long Hartmann's pouch. Loculated ascites in the right-sided abdomen, adjacent to the ostomy site, suspicious for infected ascites and possible developing abscess. Sensitivity decreased on this noncontrast exam. 2. Other areas of smaller volume pelvic and mesenteric fluid are nonspecific. 3. Coronary artery atherosclerosis. Aortic Atherosclerosis (ICD10-I70.0). 4. Caudate and lateral segment left liver lobe enlargement. Cannot exclude mild cirrhosis. 5. Left nephrolithiasis. Electronically Signed   By: Abigail Miyamoto M.D.   On: 10/25/2017 17:13   Ct Abdomen Pelvis Wo Contrast  Result Date: 10/14/2017 CLINICAL DATA:  Code sepsis. Tachycardia and decreased respiratory rate. Mid abdominal pain, acute onset. EXAM: CT CHEST, ABDOMEN AND PELVIS WITHOUT CONTRAST TECHNIQUE: Multidetector CT imaging of the chest, abdomen and pelvis was performed following the standard protocol without IV contrast. COMPARISON:  CT of the abdomen and pelvis performed 01/17/2017, and CT of the chest performed 08/18/2017  FINDINGS: CT CHEST FINDINGS Cardiovascular: The heart is normal in size. Scattered coronary artery calcifications are seen. Calcification is noted at the thoracic aorta and proximal great vessels. Mediastinum/Nodes: The mediastinum is otherwise unremarkable in appearance. No mediastinal lymphadenopathy is seen. No pericardial effusion is identified. The visualized portions of the thyroid gland are unremarkable. No axillary lymphadenopathy is seen. Lungs/Pleura: Mild peripheral scarring is noted bilaterally. Mild hazy bibasilar airspace opacities may reflect pneumonia. No definite pleural effusion or pneumothorax is seen. No dominant mass is identified. Musculoskeletal: No acute osseous abnormalities are identified. The visualized musculature is unremarkable in appearance. Bilateral breast implants are noted. CT ABDOMEN PELVIS FINDINGS Hepatobiliary: The liver is unremarkable in appearance. The gallbladder is mildly distended. The common bile duct remains normal in caliber. Pancreas: The pancreas is within normal limits. Spleen: The spleen is unremarkable in appearance. Adrenals/Urinary Tract: The adrenal glands are unremarkable in appearance. Nonspecific perinephric stranding is noted bilaterally. A nonobstructing 6 mm stone is noted at the lower pole of the left kidney. There is no evidence of hydronephrosis. No obstructing ureteral stones are seen. Stomach/Bowel: There is diffuse pneumatosis involving the cecum and ascending colon, with distention of the ascending colon to 7.4 cm in diameter. Diffuse surrounding soft tissue inflammation and fluid are seen. Bowel ischemia is a concern. There is wall thickening along the hepatic flexure of the colon, which may reflect an infectious or inflammatory process. No definite mass was seen in April. The remainder of the colon is unremarkable. The small bowel is grossly unremarkable in appearance. The stomach is unremarkable in appearance. Vascular/Lymphatic: Diffuse  calcification is seen along the abdominal aorta and its branches. There is likely severe luminal narrowing at the aortic bifurcation. The inferior vena cava is grossly unremarkable. No retroperitoneal lymphadenopathy is seen. No pelvic sidewall lymphadenopathy is identified. Reproductive: The bladder is decompressed, with a Foley catheter in place. The patient is status post hysterectomy. Trace free fluid within the pelvis may reflect the colonic process. Other: No additional soft tissue abnormalities are seen. Musculoskeletal: No acute osseous abnormalities are identified. The visualized musculature is unremarkable in appearance. IMPRESSION: 1. Diffuse pneumatosis involving the cecum and ascending colon,  with distention of the ascending colon to 7.4 cm in diameter. Diffuse surrounding soft tissue inflammation and fluid seen. This is concerning for bowel ischemia. Wall thickening along the hepatic flexure of the colon may reflect an underlying infectious or inflammatory process. No definite mass was seen in April. 2. Mild hazy bibasilar airspace opacities may reflect pneumonia. 3. Diffuse calcification along the abdominal aorta and its branches, with likely severe luminal narrowing at the aortic bifurcation. 4. Scattered coronary calcifications. 5. Nonobstructing 6 mm stone at the lower pole of the left kidney. These results were called by telephone at the time of interpretation on 10/14/2017 at 4:23 am to Dr. Jola Schmidt, who verbally acknowledged these results. Electronically Signed   By: Garald Balding M.D.   On: 10/14/2017 04:24   Dg Chest 1 View  Result Date: 10/14/2017 CLINICAL DATA:  72 year old female with a history of central line placement EXAM: CHEST 1 VIEW COMPARISON:  CT 10/14/2017, plain film 10/14/2017 FINDINGS: Cardiomediastinal silhouette unchanged. Interval placement of gastric tube projected over the mediastinum and terminates in the abdomen out of the field of view. Interval placement of  endotracheal tube, directed towards the right mainstem bronchus, and may be pulled back approximately 3-4 cm for better positioning. Interval placement of right IJ central venous catheter appears to terminate superior cavoatrial junction. Low lung volumes with basilar opacities, likely atelectasis. No pneumothorax. IMPRESSION: Low lung volumes with likely atelectasis. Interval placement of right IJ central catheter with no complicating features. Endotracheal tube terminates at the carina directed towards the right mainstem bronchus. Withdrawal of approximately 3-4 cm recommended. Gastric tube has been placed, terminating out of the field of view in the upper abdomen. These results were called by telephone at the time of interpretation on 10/14/2017 at 9:40 am to the nurse, Ms Leanna Sato, who verbally acknowledged these results. Electronically Signed   By: Corrie Mckusick D.O.   On: 10/14/2017 09:42   Dg Abd 1 View  Result Date: 10/25/2017 CLINICAL DATA:  NG tube placement. EXAM: ABDOMEN - 1 VIEW COMPARISON:  CT abdomen 10/14/2017 FINDINGS: The NG tube tip is in the body region of the stomach. The proximal port is in the fundal region. Unremarkable visualized bowel gas pattern. The bony structures appear normal. IMPRESSION: The NG tube is in good position with the tip in the body region of the stomach. Electronically Signed   By: Marijo Sanes M.D.   On: 10/25/2017 16:03   Ct Chest Wo Contrast  Result Date: 10/14/2017 CLINICAL DATA:  Code sepsis. Tachycardia and decreased respiratory rate. Mid abdominal pain, acute onset. EXAM: CT CHEST, ABDOMEN AND PELVIS WITHOUT CONTRAST TECHNIQUE: Multidetector CT imaging of the chest, abdomen and pelvis was performed following the standard protocol without IV contrast. COMPARISON:  CT of the abdomen and pelvis performed 01/17/2017, and CT of the chest performed 08/18/2017 FINDINGS: CT CHEST FINDINGS Cardiovascular: The heart is normal in size. Scattered coronary artery  calcifications are seen. Calcification is noted at the thoracic aorta and proximal great vessels. Mediastinum/Nodes: The mediastinum is otherwise unremarkable in appearance. No mediastinal lymphadenopathy is seen. No pericardial effusion is identified. The visualized portions of the thyroid gland are unremarkable. No axillary lymphadenopathy is seen. Lungs/Pleura: Mild peripheral scarring is noted bilaterally. Mild hazy bibasilar airspace opacities may reflect pneumonia. No definite pleural effusion or pneumothorax is seen. No dominant mass is identified. Musculoskeletal: No acute osseous abnormalities are identified. The visualized musculature is unremarkable in appearance. Bilateral breast implants are noted. CT ABDOMEN PELVIS FINDINGS  Hepatobiliary: The liver is unremarkable in appearance. The gallbladder is mildly distended. The common bile duct remains normal in caliber. Pancreas: The pancreas is within normal limits. Spleen: The spleen is unremarkable in appearance. Adrenals/Urinary Tract: The adrenal glands are unremarkable in appearance. Nonspecific perinephric stranding is noted bilaterally. A nonobstructing 6 mm stone is noted at the lower pole of the left kidney. There is no evidence of hydronephrosis. No obstructing ureteral stones are seen. Stomach/Bowel: There is diffuse pneumatosis involving the cecum and ascending colon, with distention of the ascending colon to 7.4 cm in diameter. Diffuse surrounding soft tissue inflammation and fluid are seen. Bowel ischemia is a concern. There is wall thickening along the hepatic flexure of the colon, which may reflect an infectious or inflammatory process. No definite mass was seen in April. The remainder of the colon is unremarkable. The small bowel is grossly unremarkable in appearance. The stomach is unremarkable in appearance. Vascular/Lymphatic: Diffuse calcification is seen along the abdominal aorta and its branches. There is likely severe luminal narrowing  at the aortic bifurcation. The inferior vena cava is grossly unremarkable. No retroperitoneal lymphadenopathy is seen. No pelvic sidewall lymphadenopathy is identified. Reproductive: The bladder is decompressed, with a Foley catheter in place. The patient is status post hysterectomy. Trace free fluid within the pelvis may reflect the colonic process. Other: No additional soft tissue abnormalities are seen. Musculoskeletal: No acute osseous abnormalities are identified. The visualized musculature is unremarkable in appearance. IMPRESSION: 1. Diffuse pneumatosis involving the cecum and ascending colon, with distention of the ascending colon to 7.4 cm in diameter. Diffuse surrounding soft tissue inflammation and fluid seen. This is concerning for bowel ischemia. Wall thickening along the hepatic flexure of the colon may reflect an underlying infectious or inflammatory process. No definite mass was seen in April. 2. Mild hazy bibasilar airspace opacities may reflect pneumonia. 3. Diffuse calcification along the abdominal aorta and its branches, with likely severe luminal narrowing at the aortic bifurcation. 4. Scattered coronary calcifications. 5. Nonobstructing 6 mm stone at the lower pole of the left kidney. These results were called by telephone at the time of interpretation on 10/14/2017 at 4:23 am to Dr. Jola Schmidt, who verbally acknowledged these results. Electronically Signed   By: Garald Balding M.D.   On: 10/14/2017 04:24   [name] Head Wo Contrast  Result Date: 10/24/2017 CLINICAL DATA:  Left facial droop.  Stroke. EXAM: MRI HEAD WITHOUT CONTRAST MRA HEAD WITHOUT CONTRAST MRA NECK WITHOUT CONTRAST TECHNIQUE: Multiplanar, multiecho pulse sequences of the brain and surrounding structures were obtained without intravenous contrast. Angiographic images of the Circle of Willis were obtained using MRA technique without intravenous contrast. Angiographic images of the neck were obtained using MRA technique without  intravenous contrast. Carotid stenosis measurements (when applicable) are obtained utilizing NASCET criteria, using the distal internal carotid diameter as the denominator. COMPARISON:  CT head 10/24/2017 FINDINGS: MRI HEAD FINDINGS Brain: Image quality degraded by motion. Acute infarcts bilaterally, left greater than right. The largest area of infarction is in the left parietal white matter. This area shows well-circumscribed hypodensity on CT suggesting this may be subacute. Additional smaller areas of acute infarct in the left temporal lobe also noted to be hypodense on CT. Small area of acute infarct in the right frontal parietal white matter. Negative for hemorrhage. Negative for hydrocephalus. Negative for hemorrhage mass or midline shift. Vascular: Loss of flow void in the left internal carotid artery compatible with occlusion. Normal flow void right internal carotid artery and  posterior circulation. Skull and upper cervical spine: Negative Sinuses/Orbits: Paranasal sinuses clear.  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 Right internal carotid artery patent without stenosis. High-grade stenosis distal left M1 segment. Both anterior cerebral arteries patent. Right middle cerebral artery is patent with irregularity which may be due to motion Both vertebral arteries are patent. The basilar is patent. Posterior cerebral arteries patent bilaterally. 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 Right carotid artery is patent. Mild narrowing of the right carotid bifurcation. Both vertebral arteries are patent to the basilar. IMPRESSION: Acute cerebral infarction bilaterally left greater than right. Acute infarcts in the left parietal white matter and left  temporal lobe show hypodensity on CT suggesting these may be subacute. Small area of acute infarct in the right frontal parietal white matter. 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 Given the acute infarcts, this may be acute. Severe stenosis left M1 Mild stenosis right carotid bifurcation. Image quality degraded by motion. These results were called by telephone at the time of interpretation on 10/24/2017 at 12:37 pm to Dr. Lake Bells, who verbally acknowledged these results. Electronically Signed   By: Franchot Gallo M.D.   On: 10/24/2017 12:38   [name] Neck Wo Contrast  Result Date: 10/24/2017 CLINICAL DATA:  Left facial droop.  Stroke. EXAM: MRI HEAD WITHOUT CONTRAST MRA HEAD WITHOUT CONTRAST MRA NECK WITHOUT CONTRAST TECHNIQUE: Multiplanar, multiecho pulse sequences of the brain and surrounding structures were obtained without intravenous contrast. Angiographic images of the Circle of Willis were obtained using MRA technique without intravenous contrast. Angiographic images of the neck were obtained using MRA technique without intravenous contrast. Carotid stenosis measurements (when applicable) are obtained utilizing NASCET criteria, using the distal internal carotid diameter as the denominator. COMPARISON:  CT head 10/24/2017 FINDINGS: MRI HEAD FINDINGS Brain: Image quality degraded by motion. Acute infarcts bilaterally, left greater than right. The largest area of infarction is in the left parietal white matter. This area shows well-circumscribed hypodensity on CT suggesting this may be subacute. Additional smaller areas of acute infarct in the left temporal lobe also noted to be hypodense on CT. Small area of acute infarct in the right frontal parietal white matter. Negative for hemorrhage. Negative for hydrocephalus. Negative for hemorrhage mass or midline shift. Vascular: Loss of flow void in the left internal carotid artery  compatible with occlusion. Normal flow void right internal carotid artery and posterior circulation. Skull and upper cervical spine: Negative Sinuses/Orbits: Paranasal sinuses clear.  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 Right internal carotid artery patent without stenosis. High-grade stenosis distal left M1 segment. Both anterior cerebral arteries patent. Right middle cerebral artery is patent with irregularity which may be due to motion Both vertebral arteries are patent. The basilar is patent. Posterior cerebral arteries patent bilaterally. 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 Right carotid artery is patent. Mild narrowing of the right carotid bifurcation. Both vertebral arteries are patent to the basilar. IMPRESSION: Acute cerebral infarction bilaterally left greater than right. Acute infarcts in the left parietal white  matter and left temporal lobe show hypodensity on CT suggesting these may be subacute. Small area of acute infarct in the right frontal parietal white matter. 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 Given the acute infarcts, this may be acute. Severe stenosis left M1 Mild stenosis right carotid bifurcation. Image quality degraded by motion. These results were called by telephone at the time of interpretation on 10/24/2017 at 12:37 pm to Dr. Lake Bells, who verbally acknowledged these results. Electronically Signed   By: Franchot Gallo M.D.   On: 10/24/2017 12:38   Mr Brain Wo Contrast  Result Date: 10/24/2017 CLINICAL DATA:  Left facial droop.  Stroke. EXAM: MRI HEAD WITHOUT CONTRAST MRA HEAD WITHOUT CONTRAST MRA NECK WITHOUT CONTRAST TECHNIQUE:  Multiplanar, multiecho pulse sequences of the brain and surrounding structures were obtained without intravenous contrast. Angiographic images of the Circle of Willis were obtained using MRA technique without intravenous contrast. Angiographic images of the neck were obtained using MRA technique without intravenous contrast. Carotid stenosis measurements (when applicable) are obtained utilizing NASCET criteria, using the distal internal carotid diameter as the denominator. COMPARISON:  CT head 10/24/2017 FINDINGS: MRI HEAD FINDINGS Brain: Image quality degraded by motion. Acute infarcts bilaterally, left greater than right. The largest area of infarction is in the left parietal white matter. This area shows well-circumscribed hypodensity on CT suggesting this may be subacute. Additional smaller areas of acute infarct in the left temporal lobe also noted to be hypodense on CT. Small area of acute infarct in the right frontal parietal white matter. Negative for hemorrhage. Negative for hydrocephalus. Negative for hemorrhage mass or midline shift. Vascular: Loss of flow void in the left internal carotid artery compatible with occlusion. Normal flow void right internal carotid artery and posterior circulation. Skull and upper cervical spine: Negative Sinuses/Orbits: Paranasal sinuses clear.  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 Right internal carotid artery patent without stenosis. High-grade stenosis distal left M1 segment. Both anterior cerebral arteries patent. Right middle cerebral artery is patent with irregularity which may be due to motion Both vertebral arteries are patent. The basilar is patent. Posterior cerebral arteries patent bilaterally. MRA NECK FINDINGS Image quality degraded by motion and lack of intravenous contrast Left common carotid artery occluded at the  origin. Left internal carotid artery occluded. Right carotid artery is patent. Mild narrowing of the right carotid bifurcation. Both vertebral arteries are patent to the basilar. IMPRESSION: Acute cerebral infarction bilaterally left greater than right. Acute infarcts in the left parietal white matter and left temporal lobe show hypodensity on CT suggesting these may be subacute. Small area of acute infarct in the right frontal parietal white matter. 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 Given the acute infarcts, this may be acute. Severe stenosis left M1 Mild stenosis right carotid bifurcation. Image quality degraded by motion. These results were called by telephone at the time of interpretation on 10/24/2017 at 12:37 pm to Dr. Lake Bells, who verbally acknowledged these results. Electronically Signed   By: Franchot Gallo M.D.   On: 10/24/2017 12:38   Dg Chest Port 1 View  Result Date: 10/26/2017 CLINICAL DATA:  Acute respiratory failure with hypoxemia. EXAM: PORTABLE CHEST 1 VIEW COMPARISON:  One-view chest x-ray 10/25/2017. FINDINGS: Endotracheal tube terminates 4 cm above the carina, stable. Right-sided PICC line is  stable. The side port of the NG tube is in the stomach. Aeration of both lungs is improved. No focal airspace disease is present. Lung volumes remain low. Atherosclerotic changes are again seen at the aortic arch. IMPRESSION: 1. Improved aeration of both lungs with persistent low lung volumes. 2. No focal airspace disease. 3. Support apparatus is stable. 4.  Aortic Atherosclerosis (ICD10-I70.0). Electronically Signed   By: San Morelle M.D.   On: 10/26/2017 06:55   Portable Chest Xray  Result Date: 10/25/2017 CLINICAL DATA:  Acute respiratory failure. EXAM: PORTABLE CHEST 1 VIEW COMPARISON:  One-view chest x-ray 10/24/2017 FINDINGS: Endotracheal tube is stable. NG tube courses off the inferior border the film. A  right-sided PICC line is stable. Atherosclerotic changes are present at the aortic arch. Lung volumes are low. Mild pulmonary vascular congestion is present. No focal airspace consolidation is present. There is no pneumothorax. IMPRESSION: 1. Low lung volumes and mild atelectasis without focal airspace disease otherwise. 2. Support apparatus is stable. Electronically Signed   By: San Morelle M.D.   On: 10/25/2017 07:20   Portable Chest Xray  Result Date: 10/24/2017 CLINICAL DATA:  Ventilator support.  Stroke. EXAM: PORTABLE CHEST 1 VIEW COMPARISON:  10/22/2017 FINDINGS: Endotracheal tube tip is 3 cm above the carina. Nasogastric tube enters the abdomen. The lungs are clear with improved aeration at the bases compared to the previous study. No worsening or new finding. Right arm PICC tip at the SVC RA junction. IMPRESSION: Lines and tubes well positioned. Better aeration the lower lungs when compared to the prior study. Electronically Signed   By: Nelson Chimes M.D.   On: 10/24/2017 15:36   Dg Chest Port 1 View  Result Date: 10/22/2017 CLINICAL DATA:  Hypoxia EXAM: PORTABLE CHEST 1 VIEW COMPARISON:  October 21, 2017 FINDINGS: Endotracheal tube tip is 4.5 cm above the carina. Nasogastric tube tip and side port are below the diaphragm. Central catheter tip is in the superior vena cava near the cavoatrial junction, stable. No pneumothorax. There is a small left pleural effusion, stable. There is bibasilar atelectatic change, stable. Heart size is upper normal with pulmonary vascularity within normal limits. IMPRESSION: Tube and catheter positions as described without pneumothorax. Fairly small left pleural effusion, stable. Bibasilar atelectasis. No new opacity evident. Stable cardiac silhouette. Electronically Signed   By: Lowella Grip III M.D.   On: 10/22/2017 07:13   Dg Chest Port 1 View  Result Date: 10/21/2017 CLINICAL DATA:  Respiratory failure EXAM: PORTABLE CHEST 1 VIEW COMPARISON:   10/19/2017 FINDINGS: Cardiac shadow is stable. Aortic calcifications are again seen. The endotracheal tube and nasogastric catheter are stable. Right jugular central line is been removed and a right-sided PICC line placed in satisfactory position. Left-sided pleural effusion is identified but somewhat improved although this may be positional in nature. The right-sided effusion also appears improved. No new focal infiltrate is seen. IMPRESSION: Bilateral pleural effusions although improved from the prior exam. This may be positional in nature. Tubes and lines as described. Electronically Signed   By: Inez Catalina M.D.   On: 10/21/2017 07:19   Dg Chest Port 1 View  Result Date: 10/19/2017 CLINICAL DATA:  Endotracheal tube EXAM: PORTABLE CHEST 1 VIEW COMPARISON:  10/18/2017 FINDINGS: Endotracheal tube terminates 3.5 cm above the carina. Cardiomegaly with mild interstitial edema. Small right and moderate left pleural effusions. Associated left lower lobe opacity, likely atelectasis. Enteric tube courses into the proximal stomach. Right IJ venous catheter terminates in the cavoatrial junction. IMPRESSION: Cardiomegaly  with mild interstitial edema and bilateral pleural effusions, left greater than right. Endotracheal tube terminates 3.5 cm above the carina. Additional support apparatus as above. Electronically Signed   By: Julian Hy M.D.   On: 10/19/2017 07:25   Dg Chest Port 1 View  Result Date: 10/18/2017 CLINICAL DATA:  Patient with acute respiratory failure. EXAM: PORTABLE CHEST 1 VIEW COMPARISON:  History graft 10/17/2017. FINDINGS: Patient is rotated to the right. ET tube terminates in the mid trachea. Right IJ central venous catheter tip projects over the superior vena cava. Enteric tube courses inferior to the diaphragm. Stable cardiac and mediastinal contours. Monitoring leads overlie the patient. Persistent small layering bilateral pleural effusions and underlying opacities. No pneumothorax.  IMPRESSION: Grossly unchanged layering effusions and underlying opacities favored represent atelectasis. Stable support apparatus. Electronically Signed   By: Lovey Newcomer M.D.   On: 10/18/2017 07:21   Dg Chest Port 1 View  Result Date: 10/17/2017 CLINICAL DATA:  Acute respiratory failure. EXAM: PORTABLE CHEST 1 VIEW COMPARISON:  Radiograph of October 15, 2017. FINDINGS: Stable cardiomediastinal silhouette. Endotracheal and nasogastric tubes are unchanged in position. Right internal jugular catheter is unchanged in position. No pneumothorax is noted. Stable bibasilar infiltrates or atelectasis is noted with associated pleural effusions. Bony thorax is unremarkable. IMPRESSION: Stable support apparatus. Stable bibasilar opacities as described above. Electronically Signed   By: Marijo Conception, M.D.   On: 10/17/2017 07:12   Dg Chest Port 1 View  Result Date: 10/15/2017 CLINICAL DATA:  Endotracheal tube position EXAM: PORTABLE CHEST 1 VIEW COMPARISON:  10/14/2017 FINDINGS: Endotracheal tube in good position. Right jugular central venous catheter tip in the lower SVC unchanged. NG coiled in the stomach Progression of bibasilar airspace disease most likely atelectasis. Progression of small bilateral effusions. Increased vascular congestion likely due IMPRESSION: Endotracheal tube in good position Progression of bibasilar airspace disease and bilateral effusions. Probable fluid overload. Electronically Signed   By: Franchot Gallo M.D.   On: 10/15/2017 06:39   Dg Chest Port 1 View  Result Date: 10/14/2017 CLINICAL DATA:  Check endotracheal tube position EXAM: PORTABLE CHEST 1 VIEW COMPARISON:  Film from earlier in the same day. FINDINGS: The endotracheal tube is been withdrawn and now lies 3 cm above the carina. Nasogastric catheter is noted within the stomach. Right jugular central line is seen. The lungs are well aerated bilaterally. Mild right basilar atelectasis is noted. IMPRESSION: Endotracheal tube in  satisfactory position. Mild right basilar atelectasis. Electronically Signed   By: Inez Catalina M.D.   On: 10/14/2017 11:44   Dg Chest Portable 1 View  Result Date: 10/14/2017 CLINICAL DATA:  Acute onset of shortness of breath.  Sepsis. EXAM: PORTABLE CHEST 1 VIEW COMPARISON:  CT of the chest performed 08/18/2017 FINDINGS: The lungs are hypoexpanded but appear grossly clear. Known tiny pulmonary nodules are not well characterized on radiograph. There is no evidence of focal opacification, pleural effusion or pneumothorax. The cardiomediastinal silhouette is within normal limits. No acute osseous abnormalities are seen. IMPRESSION: Lungs hypoexpanded but grossly clear. Electronically Signed   By: Garald Balding M.D.   On: 10/14/2017 03:34   Ct Head Code Stroke Wo Contrast`  Result Date: 10/24/2017 CLINICAL DATA:  Code stroke.  Left facial droop EXAM: CT HEAD WITHOUT CONTRAST TECHNIQUE: Contiguous axial images were obtained from the base of the skull through the vertex without intravenous contrast. COMPARISON:  None. FINDINGS: Brain: Ventricle size normal. Mild atrophy. Chronic infarct left parietal white matter. Probable chronic subinsular infarct on  the left. Negative for acute infarct.  Negative for hemorrhage or mass. Vascular: Negative for hyperdense vessel.  Atherosclerotic disease. Skull: Negative Sinuses/Orbits: Negative sinuses.  Bilateral cataract removal. Other: None ASPECTS ([place] Stroke Program Early CT Score) - Ganglionic level infarction (caudate, lentiform nuclei, internal capsule, insula, M1-M3 cortex): 7 - Supraganglionic infarction (M4-M6 cortex): 3 Total score (0-10 with 10 being normal): 10 IMPRESSION: 1. No acute abnormality 2. ASPECTS is 10 3. These results were called by telephone at the time of interpretation on 10/24/2017 at 11:11 am to Dr. Simonne Maffucci , who verbally acknowledged these results. Electronically Signed   By: Franchot Gallo M.D.   On: 10/24/2017 11:11     Labs:  CBC: Recent Labs    10/19/17 0930 10/20/17 0500 10/23/17 0438 10/26/17 0525  WBC 20.2* 23.1* 18.5* 14.7*  HGB 9.4* 10.2* 10.4* 9.2*  HCT 27.4* 30.3* 29.9* 27.6*  PLT 116* 151 205 317    COAGS: Recent Labs    10/14/17 0329  INR 1.38    BMP: Recent Labs    10/23/17 0438 10/24/17 0555 10/25/17 0440 10/26/17 0525  NA 137 141 143 142  K 2.7* 2.7* 3.2* 3.5  CL 102 104 104 107  CO2 25 29 26 24   GLUCOSE 186* 165* 136* 193*  BUN 83* 80* 86* 87*  CALCIUM 8.0* 8.4* 8.1* 7.8*  CREATININE 2.01* 1.55* 1.80* 1.65*  GFRNONAA 24* 33* 27* 30*  GFRAA 28* 38* 32* 35*    LIVER FUNCTION TESTS: Recent Labs    10/17/17 0355 10/20/17 0500 10/22/17 0500 10/23/17 0438  BILITOT 1.1 1.0 1.5* 1.5*  AST 604* 106* 55* 46*  ALT 480* 127* 64* 58*  ALKPHOS 128* 213* 160* 172*  PROT 3.7* 5.0* 4.8* 5.7*  ALBUMIN 1.3* 1.4* 1.3* 1.5*    TUMOR MARKERS: No results for input(s): AFPTM, CEA, CA199, CHROMGRNA in the last 8760 hours.  Assessment and Plan: 72 y.o. female with multiple medical problems recently admitted with abdominal pain due to ischemic colon and ileum, s/p exploratory laparotomy with partial omentectomy and resection of ischemic colon/ application of wound VAC on 10/16/17.  She underwent reexploration of abdomen with end proximal ileostomy and closure of abdominal wall hernia on 1/5.  On 1/11 patient was noted to have left facial droop and finding of acute cerebral infarction bilaterally left greater than right as well as acute infarction of left parietal white matter lobe, small area of acute infarct in the right frontoparietal white matter.  She also has respiratory failure and is currently intubated.  Due to continued abdominal pain and fever patient underwent CT scan yesterday which revealed loculated ascites in right abdomen adjacent to ostomy site suspicious for possible abscess.  Request now received  received from surgery for CT-guided aspiration/possible drain of  above-noted fluid collection.  Imaging studies have been reviewed by Dr. Barbie Banner. Risks and benefits discussed with the patient's son, Elta Guadeloupe, including bleeding, infection, damage to adjacent structures, bowel perforation/fistula connection, and sepsis.All of the patient's son's questions were answered, patient's son given consent to proceed.Consent signed and in chart.Procedure scheduled for this am.     Thank you for this interesting consult.  I greatly enjoyed meeting MALEKA CONTINO and look forward to participating in their care.  A copy of this report was sent to the requesting provider on this date.  Electronically Signed: D. Rowe Robert, PA-C 10/26/2017, 10:01 AM   I spent a total of     in face to face in clinical consultation, greater  than 50% of which was counseling/coordinating care for abdominal fluid aspiration/possible drainage

## 2017-10-27 ENCOUNTER — Inpatient Hospital Stay (HOSPITAL_COMMUNITY): Payer: Medicare Other

## 2017-10-27 LAB — TRIGLYCERIDES: Triglycerides: 171 mg/dL — ABNORMAL HIGH (ref <150)

## 2017-10-27 LAB — MAGNESIUM: Magnesium: 1.9 mg/dL (ref 1.7–2.4)

## 2017-10-27 LAB — GLUCOSE, CAPILLARY
GLUCOSE-CAPILLARY: 131 mg/dL — AB (ref 65–99)
GLUCOSE-CAPILLARY: 144 mg/dL — AB (ref 65–99)
GLUCOSE-CAPILLARY: 153 mg/dL — AB (ref 65–99)
Glucose-Capillary: 100 mg/dL — ABNORMAL HIGH (ref 65–99)
Glucose-Capillary: 109 mg/dL — ABNORMAL HIGH (ref 65–99)
Glucose-Capillary: 145 mg/dL — ABNORMAL HIGH (ref 65–99)

## 2017-10-27 LAB — CBC WITH DIFFERENTIAL/PLATELET
BASOS PCT: 1 %
Basophils Absolute: 0.1 10*3/uL (ref 0.0–0.1)
EOS ABS: 0.1 10*3/uL (ref 0.0–0.7)
Eosinophils Relative: 1 %
HEMATOCRIT: 26.2 % — AB (ref 36.0–46.0)
HEMOGLOBIN: 8.4 g/dL — AB (ref 12.0–15.0)
LYMPHS ABS: 2.2 10*3/uL (ref 0.7–4.0)
Lymphocytes Relative: 19 %
MCH: 28.1 pg (ref 26.0–34.0)
MCHC: 32.1 g/dL (ref 30.0–36.0)
MCV: 87.6 fL (ref 78.0–100.0)
MONOS PCT: 4 %
Monocytes Absolute: 0.5 10*3/uL (ref 0.1–1.0)
NEUTROS ABS: 8.8 10*3/uL — AB (ref 1.7–7.7)
Neutrophils Relative %: 75 %
Platelets: 315 10*3/uL (ref 150–400)
RBC: 2.99 MIL/uL — AB (ref 3.87–5.11)
RDW: 17.2 % — ABNORMAL HIGH (ref 11.5–15.5)
WBC: 11.7 10*3/uL — AB (ref 4.0–10.5)

## 2017-10-27 LAB — COMPREHENSIVE METABOLIC PANEL
ALT: 145 U/L — ABNORMAL HIGH (ref 14–54)
ANION GAP: 7 (ref 5–15)
AST: 238 U/L — AB (ref 15–41)
Albumin: 1.8 g/dL — ABNORMAL LOW (ref 3.5–5.0)
Alkaline Phosphatase: 186 U/L — ABNORMAL HIGH (ref 38–126)
BILIRUBIN TOTAL: 0.6 mg/dL (ref 0.3–1.2)
BUN: 75 mg/dL — AB (ref 6–20)
CO2: 23 mmol/L (ref 22–32)
Calcium: 8.1 mg/dL — ABNORMAL LOW (ref 8.9–10.3)
Chloride: 112 mmol/L — ABNORMAL HIGH (ref 101–111)
Creatinine, Ser: 1.51 mg/dL — ABNORMAL HIGH (ref 0.44–1.00)
GFR calc Af Amer: 39 mL/min — ABNORMAL LOW (ref >60)
GFR calc non Af Amer: 34 mL/min — ABNORMAL LOW (ref >60)
Glucose, Bld: 146 mg/dL — ABNORMAL HIGH (ref 65–99)
POTASSIUM: 3.7 mmol/L (ref 3.5–5.1)
Sodium: 142 mmol/L (ref 135–145)
TOTAL PROTEIN: 5.8 g/dL — AB (ref 6.5–8.1)

## 2017-10-27 LAB — PHOSPHORUS: Phosphorus: 3.9 mg/dL (ref 2.5–4.6)

## 2017-10-27 LAB — PREALBUMIN: PREALBUMIN: 17.3 mg/dL — AB (ref 18–38)

## 2017-10-27 MED ORDER — ASPIRIN 81 MG PO CHEW
81.0000 mg | CHEWABLE_TABLET | Freq: Every day | ORAL | Status: DC
  Administered 2017-10-29 – 2017-11-06 (×9): 81 mg via ORAL
  Filled 2017-10-27 (×9): qty 1

## 2017-10-27 MED ORDER — POTASSIUM CHLORIDE 10 MEQ/50ML IV SOLN
10.0000 meq | INTRAVENOUS | Status: AC
  Administered 2017-10-27 (×3): 10 meq via INTRAVENOUS
  Filled 2017-10-27 (×3): qty 50

## 2017-10-27 MED ORDER — PROPOFOL 500 MG/50ML IV EMUL
50.0000 mL | Freq: Once | INTRAVENOUS | Status: DC
  Filled 2017-10-27: qty 50

## 2017-10-27 MED ORDER — MIDAZOLAM HCL 2 MG/2ML IJ SOLN
4.0000 mg | Freq: Once | INTRAMUSCULAR | Status: AC
  Administered 2017-10-28: 2 mg via INTRAVENOUS
  Filled 2017-10-27: qty 4

## 2017-10-27 MED ORDER — FENTANYL CITRATE (PF) 100 MCG/2ML IJ SOLN
200.0000 ug | Freq: Once | INTRAMUSCULAR | Status: AC
  Administered 2017-10-28: 100 ug via INTRAVENOUS

## 2017-10-27 MED ORDER — LIP MEDEX EX OINT
TOPICAL_OINTMENT | CUTANEOUS | Status: AC
  Administered 2017-10-27: 22:00:00
  Filled 2017-10-27: qty 7

## 2017-10-27 MED ORDER — FAT EMULSION 20 % IV EMUL
240.0000 mL | INTRAVENOUS | Status: AC
  Administered 2017-10-27: 240 mL via INTRAVENOUS
  Filled 2017-10-27: qty 250

## 2017-10-27 MED ORDER — HEPARIN SODIUM (PORCINE) 5000 UNIT/ML IJ SOLN
5000.0000 [IU] | Freq: Three times a day (TID) | INTRAMUSCULAR | Status: DC
  Administered 2017-10-29 – 2017-10-30 (×4): 5000 [IU] via SUBCUTANEOUS
  Filled 2017-10-27 (×4): qty 1

## 2017-10-27 MED ORDER — HYDRALAZINE HCL 20 MG/ML IJ SOLN
INTRAMUSCULAR | Status: AC
  Filled 2017-10-27: qty 1

## 2017-10-27 MED ORDER — VECURONIUM BROMIDE 10 MG IV SOLR
10.0000 mg | Freq: Once | INTRAVENOUS | Status: AC
  Administered 2017-10-28: 10 mg via INTRAVENOUS
  Filled 2017-10-27: qty 10

## 2017-10-27 MED ORDER — ETOMIDATE 2 MG/ML IV SOLN
40.0000 mg | Freq: Once | INTRAVENOUS | Status: AC
  Administered 2017-10-28: 20 mg via INTRAVENOUS
  Filled 2017-10-27: qty 20

## 2017-10-27 MED ORDER — TRACE MINERALS CR-CU-MN-SE-ZN 10-1000-500-60 MCG/ML IV SOLN
INTRAVENOUS | Status: AC
  Administered 2017-10-27: 17:00:00 via INTRAVENOUS
  Filled 2017-10-27 (×2): qty 1992

## 2017-10-27 MED ORDER — HYDRALAZINE HCL 20 MG/ML IJ SOLN
10.0000 mg | INTRAMUSCULAR | Status: DC | PRN
  Administered 2017-10-27 – 2017-10-29 (×4): 20 mg via INTRAVENOUS
  Administered 2017-10-30: 40 mg via INTRAVENOUS
  Administered 2017-10-30 (×2): 20 mg via INTRAVENOUS
  Administered 2017-10-31: 40 mg via INTRAVENOUS
  Administered 2017-10-31 – 2017-11-01 (×2): 20 mg via INTRAVENOUS
  Administered 2017-11-02: 40 mg via INTRAVENOUS
  Filled 2017-10-27 (×2): qty 1
  Filled 2017-10-27: qty 2
  Filled 2017-10-27: qty 1
  Filled 2017-10-27: qty 2
  Filled 2017-10-27 (×2): qty 1
  Filled 2017-10-27: qty 2
  Filled 2017-10-27: qty 1
  Filled 2017-10-27 (×2): qty 2

## 2017-10-27 NOTE — Progress Notes (Signed)
[facility] for TPN  Indication: extensive large and small bowel resection  Patient Measurements: Height: '5\' 4"'  (162.6 cm) Weight: 173 lb 15.1 oz (78.9 kg) IBW/kg (Calculated) : 54.7 TPN AdjBW (KG): 60.8 Body mass index is 29.86 kg/m.  Insulin Requirements: Hx of DM. 15 units SSI/24 hrs,  Reg insulin 30 units   Current Nutrition:  TPN at goal, NPO IVF: NS at 10 ml/hr  Central access: CVC from 1/1-1/7, PICC triple lumen 1/7 TPN start date: 1/3  ASSESSMENT                                                                                                          HPI: 43 yoF to ED 1/1 with abd pain, pneumatosis R colon.   Ischemic colon/bowel with peritonitis s/p washout.  Hx of CAD, DM, Diverticulosis, HPL, HTN, chronic pancreatitis noted CVA  Significant events:  1/1 OR exp lap. R colectomy, wound vac 1/3 return to OR- expl lap > sm bowel ischemia, resected ischemic ileum > VAC replaced. Begin TPN   1/5 OR Re-exploration of abdomen with end proximal ileostomy and closure of abdominal wall hernia 1/7 CVC removed, cath tip cultured, blood cultured for persistent fever. PICC placed for TPN. 1/11 acute stroke, intubated 1/13 CT-guided drainage of RLQ fluid collection  Today:   Glucose - DM on Victoza PTA, CBGs now at goal < 150 using both SSI and insulin in TPN.  Electrolytes - Na, K, Mag, Phos, CorrCa WNL.  Remains on electrolyte-free TPN.  Renal - SCr improved to 1.51; making urine on lasix, I/O -2.2L, NG out. Weight up, volume overloaded. BUN elevated but improved.  Continue with current protein goal in setting of critical illness.  LFTs - AST/ALT elevated and increasing.  Alk Phos elevated. Bilirubin now improved to WNL.  TGs - were 349 on 1/1, Propofol d/c 1/2, Trig 366 (1/4), 168 (1/7), 198 (1/12), 171 (1/14)  Prealbumin - < 5  remains low d/t critical illness/inflammation (1/7), pending (1/14)  NUTRITIONAL GOALS                                                                                              RD recs: 10/17/17 Kcal 5681-2751 , protein 109-119 gm  Hold 20% lipid emulsion for first 7 days for ICU patients per ASPEN guidelines (Start date 10/24/17)  Clinimix 5/15 at a goal rate of 83 ml/hr to provide:1414 kcal and 100 gm protein.  111% kcal and 92 % protein.   PLAN  Now:  KCl 18mq IV x 3 runs  At 1800 today:  Continue electrolyte-free Clinimix 5/15 at 83 ml/hr  Continue 20% lipid emulsion at 20 mls/hr for 12 hours (held for first 7 days for ICU patients per ASPEN guidelines)  Continue regular insulin in TPN so that patient will receive 30 units over the 24 hour bag.  TPN to contain standard multivitamins and trace elements.  Continue SSI moderate scale q4h  TPN lab panels on Mondays & Thursdays.    F/u daily.  CGretta ArabPharmD, BCPS Pager [phone]/14/2019 10:19 AM

## 2017-10-27 NOTE — Progress Notes (Signed)
Date:  October 27, 2017 Chart reviewed for concurrent status and case management needs.  Will continue to follow patient progress. REMAINS ON FULL VENT SUPPORT AT THIS TIME. Discharge Planning: following for needs.  None present at this time of review. Expected discharge date: January 172019 Donathan Buller, BSN, [place], [place]

## 2017-10-27 NOTE — Progress Notes (Signed)
PULMONARY / CRITICAL CARE MEDICINE   Name: [name] MRN: [id number] DOB: [birth year]    ADMISSION DATE:  10/14/2017 CONSULTATION DATE:  10/14/2017  REFERRING MD:  Marlou Starks  CHIEF COMPLAINT:  Abdominal pain  HISTORY OF PRESENT ILLNESS:  72 y/o female with multiple medical problems admitted on 1/1 with abdominal pain due to ischemic colon and ileum.  Had an acute stroke during this process recognized on 1/11.    SUBJECTIVE:  Tmax 101.1.  RN reports pt in bigeminy (has been).  Weaning on PSV 10/5, 30%.    VITAL SIGNS: BP (!) 153/52   Pulse (!) 104   Temp 100.3 F (37.9 C) (Axillary)   Resp 20   Ht 5\' 4"  (1.626 m)   Wt 173 lb 15.1 oz (78.9 kg)   SpO2 99%   BMI 29.86 kg/m   HEMODYNAMICS:    VENTILATOR SETTINGS: Vent Mode: PRVC FiO2 (%):  [30 %-100 %] 30 % Set Rate:  [14 bmp-15 bmp] 15 bmp Vt Set:  [460 mL-500 mL] 460 mL PEEP:  [5 cmH20] 5 cmH20 Pressure Support:  [5 cmH20] 5 cmH20 Plateau Pressure:  [11 cmH20-20 cmH20] 14 cmH20  INTAKE / OUTPUT: I/O last 3 completed shifts: In: 4363.6 [I.V.:3738.6; Other:5; NG/GT:220; IV Piggyback:400] Out: 4525 [Urine:3175; Drains:75; LTJQZ:0092]  PHYSICAL EXAMINATION: General: ill appearing female in NAD on vent  HEENT: MM pink/moist, ETT, multiple dressings to face / fragile skin  Neuro: eyes open, no follow commands, tracks provider CV: s1s2 rrr, no m/r/g PULM: even/non-labored, lungs bilaterally coarse, diminished bases  GI: VAC midline c/d/i, ostomy with green liquid drainage,  Extremities: warm/dry, trace generalized edema  Skin: no rashes or lesions  LABS:  BMET Recent Labs  Lab 10/25/17 0440 10/26/17 0525 10/27/17 0428  NA 143 142 142  K 3.2* 3.5 3.7  CL 104 107 112*  CO2 26 24 23   BUN 86* 87* 75*  CREATININE 1.80* 1.65* 1.51*  GLUCOSE 136* 193* 146*    Electrolytes Recent Labs  Lab 10/25/17 0440 10/26/17 0525 10/27/17 0428  CALCIUM 8.1* 7.8* 8.1*  MG 1.8 2.0 1.9  PHOS 5.0* 4.0 3.9    CBC Recent  Labs  Lab 10/23/17 0438 10/26/17 0525 10/27/17 0428  WBC 18.5* 14.7* 11.7*  HGB 10.4* 9.2* 8.4*  HCT 29.9* 27.6* 26.2*  PLT 205 317 315    Coag's No results for input(s): APTT, INR in the last 168 hours.  Sepsis Markers Recent Labs  Lab 10/25/17 2312  LATICACIDVEN 1.5    ABG Recent Labs  Lab 10/24/17 1600  PHART 7.363  PCO2ART 49.9*  PO2ART 415*    Liver Enzymes Recent Labs  Lab 10/22/17 0500 10/23/17 0438 10/27/17 0428  AST 55* 46* 238*  ALT 64* 58* 145*  ALKPHOS 160* 172* 186*  BILITOT 1.5* 1.5* 0.6  ALBUMIN 1.3* 1.5* 1.8*    Cardiac Enzymes No results for input(s): TROPONINI, PROBNP in the last 168 hours.  Glucose Recent Labs  Lab 10/26/17 1549 10/26/17 1628 10/26/17 1929 10/26/17 2317 10/27/17 0320 10/27/17 0754  GLUCAP 157* 135* 143* 138* 131* 144*    Imaging Dg Chest Port 1 View  Result Date: 10/27/2017 CLINICAL DATA:  72 year old female with history of ischemic bowel status post multiple recent abdominal surgeries. Right lower quadrant abscess drain. Respiratory failure, intubated. EXAM: PORTABLE CHEST 1 VIEW COMPARISON:  Portable chest 10/26/2017, CT Abdomen and Pelvis 10/25/2017, and earlier. FINDINGS: Portable AP semi upright view at 0514 hours. More kyphotic positioning today. Stable endotracheal tube tip at  the level the clavicles. Enteric tube courses to the left abdomen, tip not included. Stable right subclavian central line. New line lung volumes and mediastinal contours not significantly changed. Increased appearance of bilateral patchy lung base opacity. No pneumothorax. No definite pleural effusion. Increased pulmonary vascularity without overt edema. The Visible bowel gas pattern within normal limits. IMPRESSION: 1.  Stable lines and tubes. 2. Patchy bibasilar pulmonary opacity appears increased since 10/25/2017 and could be atelectasis or infection. Electronically Signed   By: Genevie Ann M.D.   On: 10/27/2017 07:07   Ct Image Guided  Drainage By Percutaneous Catheter  Result Date: 10/26/2017 INDICATION: Right lower quadrant abscess EXAM: CT GUIDED DRAINAGE OF  ABSCESS MEDICATIONS: The patient is currently admitted to the hospital and receiving intravenous antibiotics. The antibiotics were administered within an appropriate time frame prior to the initiation of the procedure. ANESTHESIA/SEDATION: Patient was intubated COMPLICATIONS: None immediate. TECHNIQUE: Informed written consent was obtained from the patient after a thorough discussion of the procedural risks, benefits and alternatives. All questions were addressed. Maximal Sterile Barrier Technique was utilized including caps, mask, sterile gowns, sterile gloves, sterile drape, hand hygiene and skin antiseptic. A timeout was performed prior to the initiation of the procedure. PROCEDURE: The right lower quadrant was prepped with ChloraPrep in a sterile fashion, and a sterile drape was applied covering the operative field. A sterile gown and sterile gloves were used for the procedure. Local anesthesia was provided with 1% Lidocaine. Under CT guidance, an 18 gauge needle was inserted into the right lower quadrant abscess and removed over an Amplatz wire. Twelve [place] dilator followed by a 12 [place] drain were inserted. It was looped and string fixed then sewn to the skin. Dark fluid was aspirated. A sample was obtained and sent for culture. FINDINGS: Imaging documents placement of a 61 French drain into a right lower quadrant abscess. IMPRESSION: Successful right lower quadrant 12 French abscess drain placement. Electronically Signed   By: Marybelle Killings M.D.   On: 10/26/2017 12:43     STUDIES:  1/1 CT abdomen/pelvis: diffuse pneumatosis in cecum and ascending colon with distension, bibasilar airspace opacities, aortic calcification, non-obstructing left kidney stone Jan 11 CT head > NAICP January 11 MRI/MRA brain: Acute cerebral infarction bilateral left greater than right, left  parietal and left temporal lobe.  Small acute infarct right frontal parietal white hemorrhage, occluded left common carotid. 1/12 CT Ab/pelvis> loculated fluid collection along R abdominal wall; cannot exclude cirrhosis, liver lobe enlargement  CULTURES: 1/1 Blood > neg 1/1 urine > neg 1/7 blood > neg 1/7 cath tip > neg 1/11 blood >  1/13 Abdomen fluid >  ANTIBIOTICS: Zosyn 12/31 > 1/9 Anidulafungin 1/4 >  1/9 Meropenem 1/11 >   SIGNIFICANT EVENTS: 1/01 ex lap, R colectomy 1/03 ex lap, partial omentectomy, resection of ischemic ileum (130cm)  1/05 Ex lap, abdominal wall closure 1/09 Extubation 1/11 Acute CVA, reintubated 1/13 Fever > CT abdomen/pelvis: abscess, Moving around well, BP labile.  IR perc drain. 1/14 Weaning on PSV  LINES/TUBES: 1/1 ETT > 1/9, 1/11 >  1/1 R IJ CVL > 1/8 1/7 PICC >   DISCUSSION: 72 y/o female with multiple medical problems admitted with ischemic bowel.  Required bowel resection, exploratory laparotomy x3 closed on January 5.  Acute CVA recognized on January 11.  This is believed to be a watershed infarct related to severe sepsis on admission with ischemic bowel and bowel perforation.  Reintubated January 11, fever afterwards> CT abdomen 1/12 showed a  likely R abdominal wall abscess.  ASSESSMENT / PLAN:  NEUROLOGIC A:   Acute bilateral CVA - felt to be due to hypotension in setting of septic shock on presentation; symptoms have waxed and waned with fever, BP changes; neuro signed off 1/13 Need for sedation for mechanical vent synchrony P:   RASS goal:0 to -1  Fentanyl gtt for pain / sedation  Monitor neuro exam  Limit sedation as able SBT this am, have asked RN to lighten fentanyl to allow for neuro exam  ASA   PULMONARY A: Acute respiratory failure with hypoxemia P:   PRVC 8 cc/kg Daily PSV / SBT  VAP prevention measures  Pending neuro exam, consider extubation 1/14.  If fails, family aware she may need tracheostomy.  Consider lasix  in am 1/15 pending CXR review / sr cr   CARDIOVASCULAR A:  Hx CAD Hypertension Septic shock - resolved P:  Monitor tele > bigeminy  ASA as above   RENAL A:   AKI - improving, in setting of septic shock  P:   Trend BMP / urinary output Replace electrolytes as indicated Avoid nephrotoxic agents, ensure adequate renal perfusion  GASTROINTESTINAL A:   Shock liver - resolved Ischemic bowel - resected, belly close Severe protein calorie malnutrition Abdominal abscess P:   TPN per Pharmacy  Hold TF given CT abd Post-op care, diet per [facility] IR following, s/p abscess drain 1/13  Pepcid for SUP  HEMATOLOGIC A:   Mild anemia without bleeding P:  Trend CBC Monitor for bleeding  Heparin SQ for DVT prophylaxis   INFECTIOUS A:   Ischemic colon/bowel with peritonitis s/p washout New fever 1/12 - presumably due to fluid collection in abdomen, CXR clear, blood cultures negative, lines relatively new P:   ABX as above  Appreciate CCS, IR  ENDOCRINE A:   DM2 with hyperglycemia  P:   SSI    FAMILY  - UpdatesElta Guadeloupe [phone] updated by phone 1/13.  No family available am 1/14.    - Inter-disciplinary family meet or Palliative Care meeting due by:  day [place], NP-C [facility] Pgr: [phone] or if no answer [phone] 10/27/2017, 8:44 AM

## 2017-10-27 NOTE — Progress Notes (Signed)
9 Days Post-Op  Subjective: Intubated.  Eyes open.  Occasionally follows commands. Bedside nursing states they are continuing to try vent wean Fevers to 101.1.  CT-guided drainage of RLQ fluid collection performed yesterday.  Dark serosanguineous fluid aspirated for culture.  Overnight output 75 cc.  Gram stain negative.  Culture pending  Hemoglobin 8.4.  WBC 11,700.  Potassium 3.7.  Glucose 146.  Creatinine 1.51.  Transaminases elevated.  Bilirubin normal.  Objective: Vital signs in last 24 hours: Temp:  [97.9 F (36.6 C)-101.1 F (38.4 C)] 101.1 F (38.4 C) (01/14 0322) Pulse Rate:  [45-115] 104 (01/14 0500) Resp:  [11-25] 20 (01/14 0500) BP: (110-215)/(38-119) 153/52 (01/14 0500) SpO2:  [93 %-100 %] 99 % (01/14 0500) FiO2 (%):  [30 %-100 %] 30 % (01/14 0315) Weight:  [76.5 kg (168 lb 10.4 oz)-78.9 kg (173 lb 15.1 oz)] 78.9 kg (173 lb 15.1 oz) (01/14 0425) Last BM Date: 10/26/17  Intake/Output from previous day: 01/13 0701 - 01/14 0700 In: 1744.5 [I.V.:1489.5; IV Piggyback:250] Out: 2550 [Urine:2225; Drains:75; Stool:250] Intake/Output this shift: Total I/O In: 262.2 [I.V.:262.2] Out: 1000 [Urine:1000]   PE General appearance: intubated and sedated.  Does open eyes and look at me.  Follows commands but only intermittently. Resp: clear to auscultation bilaterally and on vent Cardio: regular rate and rhythm GI: soft, nontender. ostomy viable with some output.  Midline wound with negative pressure dressing.  RLQ drain with serosanguineous fluid in bag.     Lab Results:  Recent Labs    10/26/17 0525 10/27/17 0428  WBC 14.7* 11.7*  HGB 9.2* 8.4*  HCT 27.6* 26.2*  PLT 317 315   BMET Recent Labs    10/26/17 0525 10/27/17 0428  NA 142 142  K 3.5 3.7  CL 107 112*  CO2 24 23  GLUCOSE 193* 146*  BUN 87* 75*  CREATININE 1.65* 1.51*  CALCIUM 7.8* 8.1*   PT/INR No results for input(s): LABPROT, INR in the last 72 hours. ABG Recent Labs    10/24/17 1600   PHART 7.363  HCO3 27.7    Studies/Results: Ct Abdomen Pelvis Wo Contrast  Result Date: 10/25/2017 CLINICAL DATA:  Abdominal pain. Fever. Abscess suspected. Exploratory laparotomy on 10/18/2017. Resection of ischemic small bowel. Prior right hemicolectomy 10/14/2017. EXAM: CT ABDOMEN AND PELVIS WITHOUT CONTRAST TECHNIQUE: Multidetector CT imaging of the abdomen and pelvis was performed following the standard protocol without IV contrast. COMPARISON:  Plain films 10/25/2017.  Most recent CT of 10/14/2017. FINDINGS: Lower chest: bibasilar atelectasis. Bibasilar bilateral breast implants. Normal heart size. Hepatobiliary: Caudate and lateral segment left liver lobe enlargement. Normal gallbladder, without biliary ductal dilatation. Pancreas: Mild pancreatic atrophy. Spleen: Normal in size, without focal abnormality. Adrenals/Urinary Tract: Normal adrenal glands. Lower pole left renal collecting system calculi. No hydronephrosis. Foley catheter within the urinary bladder, not entirely decompressed. Stomach/Bowel: Nasogastric tube terminating at the gastric body. Long Hartmann's pouch. Underdistended rectosigmoid region. Right lower quadrant ileostomy. Otherwise normal small bowel. Vascular/Lymphatic: Aortic and branch vessel atherosclerosis. No abdominopelvic adenopathy. Reproductive: Hysterectomy.  No adnexal mass. Other: Loculated ascites is identified within the right lower abdomen, including at 8.0 x 6.9 cm on image 46/series 2. This is positioned immediately lateral to the ileostomy. Trace right-sided pelvic fluid is also identified on image 66/series 2. Nonspecific presacral edema. Minimal interloop mesenteric fluid including on image 59/series 2. Musculoskeletal: No acute osseous abnormality. IMPRESSION: 1. Status post right-sided ileostomy and long Hartmann's pouch. Loculated ascites in the right-sided abdomen, adjacent to the ostomy site, suspicious  for infected ascites and possible developing abscess.  Sensitivity decreased on this noncontrast exam. 2. Other areas of smaller volume pelvic and mesenteric fluid are nonspecific. 3. Coronary artery atherosclerosis. Aortic Atherosclerosis (ICD10-I70.0). 4. Caudate and lateral segment left liver lobe enlargement. Cannot exclude mild cirrhosis. 5. Left nephrolithiasis. Electronically Signed   By: Abigail Miyamoto M.D.   On: 10/25/2017 17:13   Dg Abd 1 View  Result Date: 10/25/2017 CLINICAL DATA:  NG tube placement. EXAM: ABDOMEN - 1 VIEW COMPARISON:  CT abdomen 10/14/2017 FINDINGS: The NG tube tip is in the body region of the stomach. The proximal port is in the fundal region. Unremarkable visualized bowel gas pattern. The bony structures appear normal. IMPRESSION: The NG tube is in good position with the tip in the body region of the stomach. Electronically Signed   By: Marijo Sanes M.D.   On: 10/25/2017 16:03   Dg Chest Port 1 View  Result Date: 10/26/2017 CLINICAL DATA:  Acute respiratory failure with hypoxemia. EXAM: PORTABLE CHEST 1 VIEW COMPARISON:  One-view chest x-ray 10/25/2017. FINDINGS: Endotracheal tube terminates 4 cm above the carina, stable. Right-sided PICC line is stable. The side port of the NG tube is in the stomach. Aeration of both lungs is improved. No focal airspace disease is present. Lung volumes remain low. Atherosclerotic changes are again seen at the aortic arch. IMPRESSION: 1. Improved aeration of both lungs with persistent low lung volumes. 2. No focal airspace disease. 3. Support apparatus is stable. 4.  Aortic Atherosclerosis (ICD10-I70.0). Electronically Signed   By: San Morelle M.D.   On: 10/26/2017 06:55   Ct Image Guided Drainage By Percutaneous Catheter  Result Date: 10/26/2017 INDICATION: Right lower quadrant abscess EXAM: CT GUIDED DRAINAGE OF  ABSCESS MEDICATIONS: The patient is currently admitted to the hospital and receiving intravenous antibiotics. The antibiotics were administered within an appropriate time  frame prior to the initiation of the procedure. ANESTHESIA/SEDATION: Patient was intubated COMPLICATIONS: None immediate. TECHNIQUE: Informed written consent was obtained from the patient after a thorough discussion of the procedural risks, benefits and alternatives. All questions were addressed. Maximal Sterile Barrier Technique was utilized including caps, mask, sterile gowns, sterile gloves, sterile drape, hand hygiene and skin antiseptic. A timeout was performed prior to the initiation of the procedure. PROCEDURE: The right lower quadrant was prepped with ChloraPrep in a sterile fashion, and a sterile drape was applied covering the operative field. A sterile gown and sterile gloves were used for the procedure. Local anesthesia was provided with 1% Lidocaine. Under CT guidance, an 18 gauge needle was inserted into the right lower quadrant abscess and removed over an Amplatz wire. Twelve [place] dilator followed by a 12 [place] drain were inserted. It was looped and string fixed then sewn to the skin. Dark fluid was aspirated. A sample was obtained and sent for culture. FINDINGS: Imaging documents placement of a 15 French drain into a right lower quadrant abscess. IMPRESSION: Successful right lower quadrant 12 French abscess drain placement. Electronically Signed   By: Marybelle Killings M.D.   On: 10/26/2017 12:43    Anti-infectives: Anti-infectives (From admission, onward)   Start     Dose/Rate Route Frequency Ordered Stop   10/25/17 1000  meropenem (MERREM) 1 g in sodium chloride 0.9 % 100 mL IVPB     1 g 200 mL/hr over 30 Minutes Intravenous Every 12 hours 10/24/17 1547     10/24/17 1600  meropenem (MERREM) 2 g in sodium chloride 0.9 % 100 mL IVPB  2 g 200 mL/hr over 30 Minutes Intravenous  Once 10/24/17 1547 10/24/17 1647   10/17/17 1200  anidulafungin (ERAXIS) 100 mg in sodium chloride 0.9 % 100 mL IVPB     100 mg 78 mL/hr over 100 Minutes Intravenous Every 24 hours 10/16/17 1110 10/22/17 1423    10/16/17 1200  anidulafungin (ERAXIS) 200 mg in sodium chloride 0.9 % 200 mL IVPB     200 mg 78 mL/hr over 200 Minutes Intravenous  Once 10/16/17 1110 10/16/17 1659   10/16/17 0200  piperacillin-tazobactam (ZOSYN) IVPB 3.375 g     3.375 g 12.5 mL/hr over 240 Minutes Intravenous Every 8 hours 10/15/17 2030 10/22/17 2121   10/15/17 1000  piperacillin-tazobactam (ZOSYN) IVPB 2.25 g  Status:  Discontinued     2.25 g 100 mL/hr over 30 Minutes Intravenous Every 8 hours 10/15/17 0823 10/15/17 2028   10/14/17 1000  piperacillin-tazobactam (ZOSYN) IVPB 3.375 g  Status:  Discontinued     3.375 g 12.5 mL/hr over 240 Minutes Intravenous Every 8 hours 10/14/17 0805 10/15/17 0823   10/14/17 0315  piperacillin-tazobactam (ZOSYN) IVPB 3.375 g     3.375 g 100 mL/hr over 30 Minutes Intravenous  Once 10/14/17 0302 10/14/17 0440   10/14/17 0315  vancomycin (VANCOCIN) IVPB 1000 mg/200 mL premix     1,000 mg 200 mL/hr over 60 Minutes Intravenous  Once 10/14/17 0303 10/14/17 0440      Assessment/Plan: s/p Procedure(s): RE-EXPLORATION OF ABDOMEN, ILEOSTOMY CREATION  Acute mesenteric ischemia. -Right colectomy1/1 (PT) -Small bowel resection 1/3 (TG) -End ileostomy and fascial wound closure 1/5 (FB)  RLQ "abscess" -successful drainage in IR yesterday. -Check cultures -on Merrem  CVA per neurology  VDRF per CCM -Wean versus tracheostomy   LOS: 13 days    Adin Hector 10/27/2017

## 2017-10-27 NOTE — Progress Notes (Signed)
Pharmacy Antibiotic Note  [name] is a 72 y.o. female admitted on 10/14/2017 with ischemic bowel on broad-spectrum abx and completed Eraxis.  Pharmacy is consulted for meropenem dosing.  Today, 10/27/2017: Tm 101.1, Tc 100.3 WBC 11.7 SCr improved to 1.51, CrCl ~ 35 ml/min  Plan: Meropenem 1g IV q12h Follow up renal fxn, culture results, and clinical course.  Height: 5\' 4"  (162.6 cm) Weight: 173 lb 15.1 oz (78.9 kg) IBW/kg (Calculated) : 54.7  Temp (24hrs), Avg:99.6 F (37.6 C), Min:97.9 F (36.6 C), Max:101.1 F (38.4 C)  Recent Labs  Lab 10/23/17 0438 10/24/17 0555 10/25/17 0440 10/25/17 2312 10/26/17 0525 10/27/17 0428  WBC 18.5*  --   --   --  14.7* 11.7*  CREATININE 2.01* 1.55* 1.80*  --  1.65* 1.51*  LATICACIDVEN  --   --   --  1.5  --   --     Estimated Creatinine Clearance: 35.2 mL/min (A) (by C-G formula based on SCr of 1.51 mg/dL (H)).    Allergies  Allergen Reactions  . Codeine     Altered mental state   . Iodinated Diagnostic Agents Rash    Antimicrobials this admission:  1/1 vanc x 1 0329 1/1 zosyn >> 1/9 1/3 Anidulafungin >>1/9 1/11 Meropenem >>   Dose change: 1/2 ZEI> 2.25 q8 > back to [facility] for CrCl > 20  Microbiology results:  1/1 Ucx: ng-final 1/1 BCx: ng-final 1/1 MRSA PCR: neg 1/7 Cath tip cx: NGF 1/7 BCx: NGF 1/11 BCx: ngtd 1/13 Abdomen: pending  Thank you for allowing pharmacy to be a part of this patient's care.  Gretta Arab PharmD, BCPS Pager [phone] 10/27/2017 10:41 AM

## 2017-10-27 NOTE — Progress Notes (Signed)
Patient appeared to have new left sided facial drop noted by myself and Tamala Julian, Therapist, sports. We immediately activated a code stroke and promptly took the patient to CT. Patient is alert and able to move all extremities to command. It is difficult to do a facial assessment due to facial wounds and tongue injury. Patient is unable to stick out tongue at this time or clearly speak which has been baseline as reported to me from prior RN. Patient will be monitored very closely at this time and we will have Neurologist camera in following CT to give further assessment and orders. CCM also following.

## 2017-10-27 NOTE — Progress Notes (Signed)
Pre-trach order set in place.  Heparin modified to get last dose 1/14 2000.  Then restart 1/16 0600.  ASA held 1/15 for procedure.    Reviewed plan of care with son via phone Elta Guadeloupe).  Discussed her illness from admit to current issues.  Reviewed risks / benefits of tracheostomy placement.  Will proceed with trach placement 1/15 at 1PM per Dr. Nelda Marseille.     Noe Gens, NP-C [facility] Pgr: [phone] or if no answer [phone] 10/27/2017, 3:00 PM

## 2017-10-28 ENCOUNTER — Inpatient Hospital Stay (HOSPITAL_COMMUNITY): Payer: Medicare Other

## 2017-10-28 ENCOUNTER — Encounter (HOSPITAL_COMMUNITY): Payer: Medicare Other

## 2017-10-28 LAB — CBC
HCT: 25.6 % — ABNORMAL LOW (ref 36.0–46.0)
Hemoglobin: 8.1 g/dL — ABNORMAL LOW (ref 12.0–15.0)
MCH: 28.1 pg (ref 26.0–34.0)
MCHC: 31.6 g/dL (ref 30.0–36.0)
MCV: 88.9 fL (ref 78.0–100.0)
PLATELETS: 299 10*3/uL (ref 150–400)
RBC: 2.88 MIL/uL — ABNORMAL LOW (ref 3.87–5.11)
RDW: 17.4 % — ABNORMAL HIGH (ref 11.5–15.5)
WBC: 8.9 10*3/uL (ref 4.0–10.5)

## 2017-10-28 LAB — BASIC METABOLIC PANEL
Anion gap: 7 (ref 5–15)
BUN: 68 mg/dL — AB (ref 6–20)
CHLORIDE: 116 mmol/L — AB (ref 101–111)
CO2: 20 mmol/L — ABNORMAL LOW (ref 22–32)
CREATININE: 1.24 mg/dL — AB (ref 0.44–1.00)
Calcium: 8.3 mg/dL — ABNORMAL LOW (ref 8.9–10.3)
GFR calc Af Amer: 50 mL/min — ABNORMAL LOW (ref >60)
GFR calc non Af Amer: 43 mL/min — ABNORMAL LOW (ref >60)
Glucose, Bld: 142 mg/dL — ABNORMAL HIGH (ref 65–99)
Potassium: 3.6 mmol/L (ref 3.5–5.1)
SODIUM: 143 mmol/L (ref 135–145)

## 2017-10-28 LAB — GLUCOSE, CAPILLARY
GLUCOSE-CAPILLARY: 127 mg/dL — AB (ref 65–99)
Glucose-Capillary: 129 mg/dL — ABNORMAL HIGH (ref 65–99)
Glucose-Capillary: 147 mg/dL — ABNORMAL HIGH (ref 65–99)
Glucose-Capillary: 117 mg/dL — ABNORMAL HIGH (ref 65–99)
Glucose-Capillary: 149 mg/dL — ABNORMAL HIGH (ref 65–99)
Glucose-Capillary: 125 mg/dL — ABNORMAL HIGH (ref 65–99)

## 2017-10-28 LAB — BLOOD GAS, ARTERIAL
ACID-BASE DEFICIT: 7.4 mmol/L — AB (ref 0.0–2.0)
BICARBONATE: 18.2 mmol/L — AB (ref 20.0–28.0)
Drawn by: 23532
FIO2: 100
LHR: 16 {breaths}/min
O2 Saturation: 99.2 %
PATIENT TEMPERATURE: 98.6
PEEP/CPAP: 5 cmH2O
PH ART: 7.285 — AB (ref 7.350–7.450)
VT: 460 mL
pCO2 arterial: 39.6 mmHg (ref 32.0–48.0)
pO2, Arterial: 503 mmHg — ABNORMAL HIGH (ref 83.0–108.0)

## 2017-10-28 LAB — MAGNESIUM: Magnesium: 1.6 mg/dL — ABNORMAL LOW (ref 1.7–2.4)

## 2017-10-28 LAB — PROTIME-INR
INR: 1.17
PROTHROMBIN TIME: 14.8 s (ref 11.4–15.2)

## 2017-10-28 LAB — APTT: aPTT: 36 seconds (ref 24–36)

## 2017-10-28 MED ORDER — FAT EMULSION 20 % IV EMUL
240.0000 mL | INTRAVENOUS | Status: AC
Start: 2017-10-28 — End: 2017-10-29
  Administered 2017-10-28: 240 mL via INTRAVENOUS
  Filled 2017-10-28: qty 250

## 2017-10-28 MED ORDER — TRACE MINERALS CR-CU-MN-SE-ZN 10-1000-500-60 MCG/ML IV SOLN
INTRAVENOUS | Status: AC
  Administered 2017-10-28: 17:00:00 via INTRAVENOUS
  Filled 2017-10-28 (×2): qty 1992

## 2017-10-28 MED ORDER — VITAL HIGH PROTEIN PO LIQD
1000.0000 mL | ORAL | Status: DC
  Administered 2017-10-29: 1000 mL
  Filled 2017-10-28 (×2): qty 1000

## 2017-10-28 MED ORDER — SODIUM CHLORIDE 0.9 % IV BOLUS (SEPSIS)
500.0000 mL | Freq: Once | INTRAVENOUS | Status: AC
  Administered 2017-10-28: 500 mL via INTRAVENOUS

## 2017-10-28 MED ORDER — MAGNESIUM SULFATE 2 GM/50ML IV SOLN
2.0000 g | Freq: Once | INTRAVENOUS | Status: AC
  Administered 2017-10-28: 2 g via INTRAVENOUS
  Filled 2017-10-28: qty 50

## 2017-10-28 MED ORDER — PROPOFOL 1000 MG/100ML IV EMUL
50.0000 mL | Freq: Once | INTRAVENOUS | Status: DC
  Filled 2017-10-28: qty 100

## 2017-10-28 MED ORDER — PRO-STAT SUGAR FREE PO LIQD
30.0000 mL | Freq: Two times a day (BID) | ORAL | Status: DC
  Administered 2017-10-29: 30 mL
  Filled 2017-10-28: qty 30

## 2017-10-28 MED ORDER — POTASSIUM CHLORIDE 10 MEQ/50ML IV SOLN
10.0000 meq | INTRAVENOUS | Status: AC
  Administered 2017-10-28 (×4): 10 meq via INTRAVENOUS
  Filled 2017-10-28 (×4): qty 50

## 2017-10-28 NOTE — Progress Notes (Signed)
eLink Physician-Brief Progress Note Patient Name: [name] DOB: Apr 09, 1946 MRN: [id number]   Date of Service  10/28/2017  HPI/Events of Note  Multiple issues: 1. Nurse reports that the patient is less responsive - Currently on a Fentanyl IV infusion at 200 mcg/hour and 2. Mg++ = 1.6 and Creatinine = 1.24. Patient is wake and is able to move all extremities to command. Blood glucose = 149 and Na+ = 143   eICU Interventions  Will order: 1. ABG STAT. 2. Decrease Fentanyl IV infusion to 100 mcg/hour.      Intervention Category Major Interventions: Change in mental status - evaluation and management  Sommer,Steven Eugene 10/28/2017, 8:03 PM

## 2017-10-28 NOTE — Progress Notes (Signed)
[place] for TPN  Indication: extensive large and small bowel resection  Patient Measurements: Height: '5\' 4"'  (162.6 cm) Weight: 173 lb 15.1 oz (78.9 kg) IBW/kg (Calculated) : 54.7 TPN AdjBW (KG): 60.8 Body mass index is 29.86 kg/m.  Insulin Requirements: Hx of DM. 7 units SSI/24 hrs,  Reg insulin 30 units   Current Nutrition:  TPN at goal, NPO IVF: NS at 10 ml/hr  Central access: CVC from 1/1-1/7, PICC triple lumen 1/7 TPN start date: 1/3  ASSESSMENT                                                                                                          HPI: 72 yoF to ED 1/1 with abd pain, pneumatosis R colon.   Ischemic colon/bowel with peritonitis s/p washout.  Hx of CAD, DM, Diverticulosis, HPL, HTN, chronic pancreatitis noted CVA  Significant events:  1/1 OR exp lap. R colectomy, wound vac 1/3 return to OR- expl lap > sm bowel ischemia, resected ischemic ileum > VAC replaced. Begin TPN  1/5 OR Re-exploration of abdomen with end proximal ileostomy and closure of abdominal wall hernia 1/7 CVC removed, cath tip and blood culture for persistent fever. PICC placed for TPN. 1/11 acute stroke, intubated 1/13 CT-guided drainage of RLQ fluid collection 1/15 Tracheostomy  Today:   Glucose - DM on Victoza PTA, CBGs now at goal < 150.  SSI use is decreased, continued same insulin in TPN.  Electrolytes - Na, K, CorrCa WNL.  Remains on electrolyte-free TPN.  Renal - SCr improved to 1.24; BUN elevated but improved.  Continue with current protein goal in setting of critical illness.  UOP -2175 mL; Volume overload with increasing weight.  LFTs - AST/ALT elevated and increasing.  Alk Phos elevated. Bilirubin now improved to WNL.  TGs - were 349 on 1/1, Propofol d/c 1/2, Trig 366 (1/4), 168 (1/7), 198 (1/12), 171 (1/14)  Prealbumin - < 5  remains low d/t critical illness/inflammation (1/7), 17.3 (1/14)  NUTRITIONAL GOALS                                                                                              RD recs: 10/17/17 Kcal 4193-7902 , protein 109-119 gm  Hold 20% lipid emulsion for first 7 days for ICU patients per ASPEN guidelines (Start date 10/24/17)  Clinimix 5/15 at a goal rate of 83 ml/hr to provide:1414 kcal and 100 gm protein.  111% kcal and 92 % protein.   PLAN  Now:  KCl 78mq IV x 4 runs per MD  At 1800 today:  Continue electrolyte-free Clinimix 5/15 at 83 ml/hr  Continue 20% lipid emulsion at 20 mls/hr for 12 hours (held for first 7 days for ICU patients per ASPEN guidelines)  Continue regular insulin in TPN, patient will receive 30 units over the 24 hour bag.  TPN to contain standard multivitamins and trace elements.  Continue SSI moderate scale q4h  TPN lab panels on Mondays & Thursdays.    Recheck BMET, Mag, Phos with AM labs  F/u daily.  Consider cyclic TPN if planning to continue long-term  CGretta ArabPharmD, BCPS Pager [phone]/15/2019 10:17 AM

## 2017-10-28 NOTE — Progress Notes (Signed)
PULMONARY / CRITICAL CARE MEDICINE   Name: [name] MRN: [id number] DOB: 06/05/1946    ADMISSION DATE:  10/14/2017 CONSULTATION DATE:  10/14/2017  REFERRING MD:  Marlou Starks  CHIEF COMPLAINT:  Abdominal pain  HISTORY OF PRESENT ILLNESS:  72 y/o female with multiple medical problems admitted on 1/1 with abdominal pain due to ischemic colon and ileum s/p right colectomy (1/1), small bowel resection (1/3), end ileostomy / fascial wound closure (1/5).  Had an acute stroke during this process recognized on 1/11. Developed new fevers 1/12, abd CT demonstrated fluid collection s/p IR drain 1/13.     SUBJECTIVE:  Tmax 101.9.  Fentanyl @ 300 mcg.  Pt turned to PSV 10/5.    VITAL SIGNS: BP (!) 158/64 (BP Location: Left Arm)   Pulse (!) 115   Temp 98.4 F (36.9 C) (Axillary)   Resp (!) 23   Ht 5\' 4"  (1.626 m)   Wt 173 lb 15.1 oz (78.9 kg)   SpO2 98%   BMI 29.86 kg/m   HEMODYNAMICS:    VENTILATOR SETTINGS: Vent Mode: PRVC FiO2 (%):  [30 %] 30 % Set Rate:  [15 bmp-16 bmp] 16 bmp Vt Set:  [460 mL] 460 mL PEEP:  [5 cmH20] 5 cmH20 Pressure Support:  [5 cmH20] 5 cmH20 Plateau Pressure:  [8 cmH20-18 cmH20] 8 cmH20  INTAKE / OUTPUT: I/O last 3 completed shifts: In: 2928.5 [I.V.:2728.5; IV Piggyback:200] Out: 5852 [Urine:3175; Drains:50; Stool:25]  PHYSICAL EXAMINATION: General: ill appearing female in NAD on vent  HEENT: MM pink/moist, ETT, bloody oral secretions Neuro: Awake, alert, follows commands  CV: s1s2 rrr, no m/r/g PULM: even/non-labored, lungs bilaterally coarse  GI: obese, midline incision with VAC, bag drain   Extremities: warm/dry, trace generalized edema  Skin: no rashes or lesions  LABS:  BMET Recent Labs  Lab 10/26/17 0525 10/27/17 0428 10/28/17 0500  NA 142 142 143  K 3.5 3.7 3.6  CL 107 112* 116*  CO2 24 23 20*  BUN 87* 75* 68*  CREATININE 1.65* 1.51* 1.24*  GLUCOSE 193* 146* 142*    Electrolytes Recent Labs  Lab 10/25/17 0440 10/26/17 0525  10/27/17 0428 10/28/17 0500  CALCIUM 8.1* 7.8* 8.1* 8.3*  MG 1.8 2.0 1.9  --   PHOS 5.0* 4.0 3.9  --     CBC Recent Labs  Lab 10/26/17 0525 10/27/17 0428 10/28/17 0500  WBC 14.7* 11.7* 8.9  HGB 9.2* 8.4* 8.1*  HCT 27.6* 26.2* 25.6*  PLT 317 315 299    Coag's Recent Labs  Lab 10/28/17 0500  APTT 36  INR 1.17    Sepsis Markers Recent Labs  Lab 10/25/17 2312  LATICACIDVEN 1.5    ABG Recent Labs  Lab 10/24/17 1600  PHART 7.363  PCO2ART 49.9*  PO2ART 415*    Liver Enzymes Recent Labs  Lab 10/22/17 0500 10/23/17 0438 10/27/17 0428  AST 55* 46* 238*  ALT 64* 58* 145*  ALKPHOS 160* 172* 186*  BILITOT 1.5* 1.5* 0.6  ALBUMIN 1.3* 1.5* 1.8*    Cardiac Enzymes No results for input(s): TROPONINI, PROBNP in the last 168 hours.  Glucose Recent Labs  Lab 10/27/17 1131 10/27/17 1507 10/27/17 1919 10/27/17 2335 10/28/17 0322 10/28/17 0752  GLUCAP 109* 153* 100* 145* 129* 127*    Imaging Dg Chest Port 1 View  Result Date: 10/28/2017 CLINICAL DATA:  Respiratory failure. EXAM: PORTABLE CHEST 1 VIEW COMPARISON:  Radiograph of October 27, 2017. FINDINGS: Stable cardiomediastinal silhouette. Atherosclerosis of thoracic aorta is noted. Endotracheal and  nasogastric tubes are unchanged in position. No pneumothorax is noted. Right-sided PICC line is unchanged in position. Minimal left basilar subsegmental atelectasis is noted which is improved compared to prior exam. Right lung is clear. Bony thorax is unremarkable. IMPRESSION: Stable support apparatus. Aortic atherosclerosis. Minimal left basilar subsegmental atelectasis. Electronically Signed   By: Marijo Conception, M.D.   On: 10/28/2017 07:27     STUDIES:  1/1 CT abdomen/pelvis: diffuse pneumatosis in cecum and ascending colon with distension, bibasilar airspace opacities, aortic calcification, non-obstructing left kidney stone Jan 11 CT head > NAICP January 11 MRI/MRA brain: Acute cerebral infarction bilateral  left greater than right, left parietal and left temporal lobe.  Small acute infarct right frontal parietal white hemorrhage, occluded left common carotid. 1/12 CT Ab/pelvis> loculated fluid collection along R abdominal wall; cannot exclude cirrhosis, liver lobe enlargement  CULTURES: 1/1 Blood > neg 1/1 urine > neg 1/7 blood > neg 1/7 cath tip > neg 1/11 blood >  1/13 Abdomen fluid >  ANTIBIOTICS: Zosyn 12/31 > 1/9 Anidulafungin 1/4 >  1/9 Meropenem 1/11 >   SIGNIFICANT EVENTS: 1/01 ex lap, R colectomy 1/03 ex lap, partial omentectomy, resection of ischemic ileum (130cm)  1/05 Ex lap, abdominal wall closure 1/09 Extubation 1/11 Acute CVA, reintubated 1/13 Fever > CT abdomen/pelvis: abscess, Moving around well, BP labile.  IR perc drain. 1/14 Weaning on PSV 10/5, 30%, Tmax 101.1.  Pt in bigeminy (has been).   LINES/TUBES: 1/1 ETT > 1/9, 1/11 >  1/1 R IJ CVL > 1/8 1/7 PICC >   DISCUSSION: 72 y/o female with multiple medical problems admitted with ischemic bowel.  Required bowel resection, exploratory laparotomy x3 closed on January 5.  Acute CVA recognized on January 11.  This is believed to be a watershed infarct related to severe sepsis on admission with ischemic bowel and bowel perforation.  Reintubated January 11, fever afterwards> CT abdomen 1/12 showed a likely R abdominal wall abscess.  ASSESSMENT / PLAN:  NEUROLOGIC A:   Acute bilateral CVA - felt to be due to hypotension / watershed in setting of septic shock on presentation; symptoms have waxed and waned with fever, BP changes; neuro signed off 1/13 Need for sedation for mechanical vent synchrony P:   RASS goal: 0 to -1  Fentanyl gtt for pain / sedation  Monitor neuro exam  Limit sedation as able  Neuro > felt she would recover with possibility of residual right sided weakness   PULMONARY A: Acute respiratory failure with hypoxemia P:   PRVC 8 cc/kg Daily SBT / WUA  Planned trach at 1pm per Dr.  Marlena Clipper  VAP prevention measures  Consider lasix after trach placement   CARDIOVASCULAR A:  Hx CAD Hypertension Bigeminy - 1/14 Septic shock - resolved P:  Tele monitoring  ASA (on hold for trach)  RENAL A:   AKI - improving, in setting of septic shock  P:   Trend BMP / urinary output Replace electrolytes as indicated Avoid nephrotoxic agents, ensure adequate renal perfusion  GASTROINTESTINAL A:   Shock liver - resolved Ischemic bowel - resected, abd closed, VAC in place Severe protein calorie malnutrition Abdominal abscess P:   TPN per Pharmacy  TF on hold given abscess  Post-op care, diet per [facility] IR following, s/p abscess drain 1/13 Pepcid for SUP   HEMATOLOGIC A:   Mild anemia without bleeding P:  Trend CBC Monitor for bleeding  Heparin SQ for DVT prophylaxis   INFECTIOUS A:   Ischemic  colon/bowel with peritonitis s/p washout Fever - new 1/12, presumably due to fluid collection in abdomen, CXR clear, blood cultures negative, lines relatively new P:   ABX as above, continue meropenem Await blood, abd cultures Appreciate [facility], IR assistance   ENDOCRINE A:   DM2 with hyperglycemia  P:   SSI    FAMILY  - Updates: Mark [phone] updated by phone 1/14.  Will update on arrival.    - Inter-disciplinary family meet or Palliative Care meeting due by: Son willing to continue to support her aggressively with the hopes of recovery.  He also is very focused on her quality of life and wants to consider each decision / step of her care based on quality.  If she has no chance for meaningful recovery, he would not likely want to continue support.  Full Code.    Noe Gens, NP-C [facility] Pgr: [phone] or if no answer [phone] 10/28/2017, 8:43 AM

## 2017-10-28 NOTE — Progress Notes (Signed)
Patietn returned to full support due to agitation/sedation. RT will continue to monitor patient.

## 2017-10-28 NOTE — Progress Notes (Signed)
Referring Physician(s): Toth,P  Supervising Physician: [name]  Patient Status:  [name]  Chief Complaint:  right abdominal fluid collection  Subjective: Pt still intubated; awaiting trach placement; responds with head nods to yes/no questions; denies worsening abd pain   Allergies: Codeine and Iodinated diagnostic agents  Medications: Prior to Admission medications   Medication Sig Start Date End Date Taking? Authorizing Provider  ABILIFY 5 MG tablet Take 5 mg by mouth daily.  04/19/11  Yes [provider]  amLODipine (NORVASC) 10 MG tablet Take 10 mg by mouth daily.     Yes [provider]  ASPERCREME HEAT 10 % GEL Apply 1 application topically as needed (arthritis).  11/28/16  Yes [provider]  aspirin EC 81 MG tablet Take 81 mg by mouth every evening.   Yes [provider]  atorvastatin (LIPITOR) 20 MG tablet Take 20 mg by mouth daily.     Yes [provider]  Cyanocobalamin (VITAMIN B-12 PO) Take 1 tablet by mouth once a week.    Yes [provider]  dicyclomine (BENTYL) 20 MG tablet Take 10 mg by mouth 4 (four) times daily -  before meals and at bedtime.   Yes [provider]  diphenhydrAMINE (BENADRYL) 50 MG tablet Take 1 tablet (50 mg total) by mouth daily as needed for allergies. 02/24/17  Yes Collene Gobble, MD  esomeprazole (NEXIUM) 40 MG capsule Take 40 mg by mouth 2 (two) times daily.     Yes [provider]  furosemide (LASIX) 20 MG tablet Take 20 mg by mouth daily. 09/22/14  Yes [provider]  gabapentin (NEURONTIN) 300 MG capsule Take 300 mg by mouth 2 (two) times daily.    Yes [provider]  HYDROcodone-acetaminophen (NORCO/VICODIN) 5-325 MG tablet Take 1 tablet by mouth 4 (four) times daily as needed for severe pain.  01/31/17  Yes [provider]  ibuprofen (ADVIL,MOTRIN) 200 MG tablet Take 200-400 mg by mouth daily as needed for headache or moderate  pain.   Yes [provider]  isosorbide mononitrate (IMDUR) 60 MG 24 hr tablet TAKE 1 TABLET BY MOUTH EVERY DAY 08/18/17  Yes Almyra Deforest, PA  loperamide (IMODIUM) 1 MG/5ML solution Take 2 mg by mouth daily as needed for diarrhea or loose stools.   Yes [provider]  losartan (COZAAR) 100 MG tablet Take 100 mg by mouth daily.   Yes [provider]  methscopolamine (PAMINE FORTE) 5 MG tablet Take 5 mg by mouth at bedtime.     Yes [provider]  metoprolol succinate (TOPROL-XL) 100 MG 24 hr tablet Take 100 mg by mouth daily. 01/24/17  Yes [provider]  montelukast (SINGULAIR) 10 MG tablet Take 10 mg by mouth daily.     Yes [provider]  MYRBETRIQ 50 MG TB24 tablet Take 50 mg by mouth daily. 10/10/14  Yes [provider]  nitroGLYCERIN (NITROSTAT) 0.4 MG SL tablet Place 1 tablet (0.4 mg total) under the tongue every 5 (five) minutes as needed for chest pain. 11/08/15  Yes Martinique, Peter M, MD  Pancrelipase, Lip-Prot-Amyl, (ZENPEP) 25000-79000 units CPEP Take 2 capsules by mouth 3 (three) times daily before meals. And with snacks 01/13/17  Yes Ladene Artist, MD  rOPINIRole (REQUIP) 0.5 MG tablet Take 0.5 mg by mouth at bedtime as needed (restless legs).  11/28/16  Yes [provider]  tamsulosin (FLOMAX) 0.4 MG CAPS capsule Take 0.4 mg by mouth daily.  Yes [provider]  venlafaxine (EFFEXOR-XR) 150 MG 24 hr capsule Take 150 mg by mouth daily.     Yes [provider]  VICTOZA 18 MG/3ML SOPN Inject 1.8 mg into the skin every evening.  10/06/15  Yes [provider]  zolpidem (AMBIEN) 10 MG tablet Take 10 mg by mouth at bedtime as needed for sleep. 01/11/17  Yes [provider]     Vital Signs: BP (!) 92/36   Pulse 98   Temp 98.7 F (37.1 C) (Axillary)   Resp 10   Ht 5\' 4"  (1.626 m)   Wt 173 lb 15.1 oz (78.9 kg)   SpO2 96%   BMI 29.86 kg/m   Physical Exam awake/eyes open; RLQ drain  intact, output 50 cc bloody fluid; insertion site with some old blood on gauze/at entry site; drain flushed with 10 cc sterile NS with return of same; cx neg to date   Imaging: Ct Abdomen Pelvis Wo Contrast  Result Date: 10/25/2017 CLINICAL DATA:  Abdominal pain. Fever. Abscess suspected. Exploratory laparotomy on 10/18/2017. Resection of ischemic small bowel. Prior right hemicolectomy 10/14/2017. EXAM: CT ABDOMEN AND PELVIS WITHOUT CONTRAST TECHNIQUE: Multidetector CT imaging of the abdomen and pelvis was performed following the standard protocol without IV contrast. COMPARISON:  Plain films 10/25/2017.  Most recent CT of 10/14/2017. FINDINGS: Lower chest: bibasilar atelectasis. Bibasilar bilateral breast implants. Normal heart size. Hepatobiliary: Caudate and lateral segment left liver lobe enlargement. Normal gallbladder, without biliary ductal dilatation. Pancreas: Mild pancreatic atrophy. Spleen: Normal in size, without focal abnormality. Adrenals/Urinary Tract: Normal adrenal glands. Lower pole left renal collecting system calculi. No hydronephrosis. Foley catheter within the urinary bladder, not entirely decompressed. Stomach/Bowel: Nasogastric tube terminating at the gastric body. Long Hartmann's pouch. Underdistended rectosigmoid region. Right lower quadrant ileostomy. Otherwise normal small bowel. Vascular/Lymphatic: Aortic and branch vessel atherosclerosis. No abdominopelvic adenopathy. Reproductive: Hysterectomy.  No adnexal mass. Other: Loculated ascites is identified within the right lower abdomen, including at 8.0 x 6.9 cm on image 46/series 2. This is positioned immediately lateral to the ileostomy. Trace right-sided pelvic fluid is also identified on image 66/series 2. Nonspecific presacral edema. Minimal interloop mesenteric fluid including on image 59/series 2. Musculoskeletal: No acute osseous abnormality. IMPRESSION: 1. Status post right-sided ileostomy and long Hartmann's pouch. Loculated  ascites in the right-sided abdomen, adjacent to the ostomy site, suspicious for infected ascites and possible developing abscess. Sensitivity decreased on this noncontrast exam. 2. Other areas of smaller volume pelvic and mesenteric fluid are nonspecific. 3. Coronary artery atherosclerosis. Aortic Atherosclerosis (ICD10-I70.0). 4. Caudate and lateral segment left liver lobe enlargement. Cannot exclude mild cirrhosis. 5. Left nephrolithiasis. Electronically Signed   By: Abigail Miyamoto M.D.   On: 10/25/2017 17:13   Dg Abd 1 View  Result Date: 10/25/2017 CLINICAL DATA:  NG tube placement. EXAM: ABDOMEN - 1 VIEW COMPARISON:  CT abdomen 10/14/2017 FINDINGS: The NG tube tip is in the body region of the stomach. The proximal port is in the fundal region. Unremarkable visualized bowel gas pattern. The bony structures appear normal. IMPRESSION: The NG tube is in good position with the tip in the body region of the stomach. Electronically Signed   By: Marijo Sanes M.D.   On: 10/25/2017 16:03   Dg Chest Port 1 View  Result Date: 10/28/2017 CLINICAL DATA:  Respiratory failure. EXAM: PORTABLE CHEST 1 VIEW COMPARISON:  Radiograph of October 27, 2017. FINDINGS: Stable cardiomediastinal silhouette. Atherosclerosis of thoracic aorta is noted. Endotracheal and nasogastric tubes are unchanged  in position. No pneumothorax is noted. Right-sided PICC line is unchanged in position. Minimal left basilar subsegmental atelectasis is noted which is improved compared to prior exam. Right lung is clear. Bony thorax is unremarkable. IMPRESSION: Stable support apparatus. Aortic atherosclerosis. Minimal left basilar subsegmental atelectasis. Electronically Signed   By: Marijo Conception, M.D.   On: 10/28/2017 07:27   Dg Chest Port 1 View  Result Date: 10/27/2017 CLINICAL DATA:  72 year old female with history of ischemic bowel status post multiple recent abdominal surgeries. Right lower quadrant abscess drain. Respiratory failure,  intubated. EXAM: PORTABLE CHEST 1 VIEW COMPARISON:  Portable chest 10/26/2017, CT Abdomen and Pelvis 10/25/2017, and earlier. FINDINGS: Portable AP semi upright view at 0514 hours. More kyphotic positioning today. Stable endotracheal tube tip at the level the clavicles. Enteric tube courses to the left abdomen, tip not included. Stable right subclavian central line. New line lung volumes and mediastinal contours not significantly changed. Increased appearance of bilateral patchy lung base opacity. No pneumothorax. No definite pleural effusion. Increased pulmonary vascularity without overt edema. The Visible bowel gas pattern within normal limits. IMPRESSION: 1.  Stable lines and tubes. 2. Patchy bibasilar pulmonary opacity appears increased since 10/25/2017 and could be atelectasis or infection. Electronically Signed   By: Genevie Ann M.D.   On: 10/27/2017 07:07   Dg Chest Port 1 View  Result Date: 10/26/2017 CLINICAL DATA:  Acute respiratory failure with hypoxemia. EXAM: PORTABLE CHEST 1 VIEW COMPARISON:  One-view chest x-ray 10/25/2017. FINDINGS: Endotracheal tube terminates 4 cm above the carina, stable. Right-sided PICC line is stable. The side port of the NG tube is in the stomach. Aeration of both lungs is improved. No focal airspace disease is present. Lung volumes remain low. Atherosclerotic changes are again seen at the aortic arch. IMPRESSION: 1. Improved aeration of both lungs with persistent low lung volumes. 2. No focal airspace disease. 3. Support apparatus is stable. 4.  Aortic Atherosclerosis (ICD10-I70.0). Electronically Signed   By: San Morelle M.D.   On: 10/26/2017 06:55   Portable Chest Xray  Result Date: 10/25/2017 CLINICAL DATA:  Acute respiratory failure. EXAM: PORTABLE CHEST 1 VIEW COMPARISON:  One-view chest x-ray 10/24/2017 FINDINGS: Endotracheal tube is stable. NG tube courses off the inferior border the film. A right-sided PICC line is stable. Atherosclerotic changes are  present at the aortic arch. Lung volumes are low. Mild pulmonary vascular congestion is present. No focal airspace consolidation is present. There is no pneumothorax. IMPRESSION: 1. Low lung volumes and mild atelectasis without focal airspace disease otherwise. 2. Support apparatus is stable. Electronically Signed   By: San Morelle M.D.   On: 10/25/2017 07:20   Portable Chest Xray  Result Date: 10/24/2017 CLINICAL DATA:  Ventilator support.  Stroke. EXAM: PORTABLE CHEST 1 VIEW COMPARISON:  10/22/2017 FINDINGS: Endotracheal tube tip is 3 cm above the carina. Nasogastric tube enters the abdomen. The lungs are clear with improved aeration at the bases compared to the previous study. No worsening or new finding. Right arm PICC tip at the SVC RA junction. IMPRESSION: Lines and tubes well positioned. Better aeration the lower lungs when compared to the prior study. Electronically Signed   By: Nelson Chimes M.D.   On: 10/24/2017 15:36   Ct Image Guided Drainage By Percutaneous Catheter  Result Date: 10/26/2017 INDICATION: Right lower quadrant abscess EXAM: CT GUIDED DRAINAGE OF  ABSCESS MEDICATIONS: The patient is currently admitted to the hospital and receiving intravenous antibiotics. The antibiotics were administered within an appropriate time frame  prior to the initiation of the procedure. ANESTHESIA/SEDATION: Patient was intubated COMPLICATIONS: None immediate. TECHNIQUE: Informed written consent was obtained from the patient after a thorough discussion of the procedural risks, benefits and alternatives. All questions were addressed. Maximal Sterile Barrier Technique was utilized including caps, mask, sterile gowns, sterile gloves, sterile drape, hand hygiene and skin antiseptic. A timeout was performed prior to the initiation of the procedure. PROCEDURE: The right lower quadrant was prepped with ChloraPrep in a sterile fashion, and a sterile drape was applied covering the operative field. A sterile  gown and sterile gloves were used for the procedure. Local anesthesia was provided with 1% Lidocaine. Under CT guidance, an 18 gauge needle was inserted into the right lower quadrant abscess and removed over an Amplatz wire. Twelve [place] dilator followed by a 12 [place] drain were inserted. It was looped and string fixed then sewn to the skin. Dark fluid was aspirated. A sample was obtained and sent for culture. FINDINGS: Imaging documents placement of a 87 French drain into a right lower quadrant abscess. IMPRESSION: Successful right lower quadrant 12 French abscess drain placement. Electronically Signed   By: [name] M.D.   On: 10/26/2017 12:43    Labs:  CBC: Recent Labs    10/23/17 0438 10/26/17 0525 10/27/17 0428 10/28/17 0500  WBC 18.5* 14.7* 11.7* 8.9  HGB 10.4* 9.2* 8.4* 8.1*  HCT 29.9* 27.6* 26.2* 25.6*  PLT 205 317 315 299    COAGS: Recent Labs    10/14/17 0329 10/28/17 0500  INR 1.38 1.17  APTT  --  36    BMP: Recent Labs    10/25/17 0440 10/26/17 0525 10/27/17 0428 10/28/17 0500  NA 143 142 142 143  K 3.2* 3.5 3.7 3.6  CL 104 107 112* 116*  CO2 26 24 23  20*  GLUCOSE 136* 193* 146* 142*  BUN 86* 87* 75* 68*  CALCIUM 8.1* 7.8* 8.1* 8.3*  CREATININE 1.80* 1.65* 1.51* 1.24*  GFRNONAA 27* 30* 34* 43*  GFRAA 32* 35* 39* 50*    LIVER FUNCTION TESTS: Recent Labs    10/20/17 0500 10/22/17 0500 10/23/17 0438 10/27/17 0428  BILITOT 1.0 1.5* 1.5* 0.6  AST 106* 55* 46* 238*  ALT 127* 64* 58* 145*  ALKPHOS 213* 160* 172* 186*  PROT 5.0* 4.8* 5.7* 5.8*  ALBUMIN 1.4* 1.3* 1.5* 1.8*    Assessment and Plan: Pt s/p drainage of post op right abd fluid collection 1/13; afebrile; WBC nl; hgb 8.1(8.4), creat 1.24(1.51), drain fluid/blood cx neg to date; CXR ok; cont drain until output minimal then obtain f/u CT to reassess region before drain removal   Electronically Signed: D. Rowe Robert, PA-C 10/28/2017, 1:09 PM   I spent a total of 15 minutes at the  the patient's bedside AND on the patient's hospital floor or unit, greater than 50% of which was counseling/coordinating care for right abdominal drain    Patient ID: [name], female   DOB: [birth year], 72 y.o.   MRN: [id number]

## 2017-10-28 NOTE — Progress Notes (Signed)
[facility] Progress Note Patient Name: [name] DOB: 1945-11-07 MRN: [id number]   Date of Service  10/28/2017  HPI/Events of Note  ABG = 7.28/39/503/18.2. Mental status decline certainly not related to hypercarbia.   eICU Interventions  Wean FiO2 as tolerated.      Intervention Category Major Interventions: Respiratory failure - evaluation and management  Sommer,Steven Eugene 10/28/2017, 8:43 PM

## 2017-10-28 NOTE — Progress Notes (Signed)
[facility] Progress Note Patient Name: [name] DOB: 05/26/1946 MRN: [id number]   Date of Service  10/28/2017  HPI/Events of Note  1 Multiple issues: 1. Review KUB for gastric tube placement and 2. Agitation - Request for bilateral soft wrist restraints. Gastric tube tip at GE junction and can't be used in this position.   eICU Interventions  Will order: 1. Please pull gastric tubae back 8 cm and re-advance. Repeat KUB post tube repositioning.  2. Bilateral soft wrist restraints.      Intervention Category Major Interventions: Other:  Lysle Dingwall 10/28/2017, 10:07 PM

## 2017-10-28 NOTE — Progress Notes (Signed)
CCM notified of change in patients neurological status. Pt unable to track. Moves extremities but not following commands. New onset of tremors and jerking activity. Fentanyl decreased to 100 mcg. Magnesium replaced. No orders for head CT or MRI per Dr. Oletta Darter. Pt became increasingly more agitated. Pt given prn versed. Fentanyl increased to 200 mcg. Pt restrained. Will continue to monitor.

## 2017-10-28 NOTE — Consult Note (Signed)
Reason for Consult: forehead wound Referring Physician: Dr. Stark Klein  [name] is an 72 y.o. female.  HPI: The patient is a 72 yrs old wf here for  She presented to the ED with mental status change, tachycardic and febrile.  She had significant abdminal pain and loss of appetite.  The CT showed pneumatosis of the right colon and underwent a right colectomy by general surgery on 10/14/17. She required reoperation on 1/3 for partial omentectomy and resection of the ileum. An end proximal ileostomy and closure of the abdominal wall hernia was done on 10/18/17.  She was noted to have a wound on the forehead, likely related to a monitor.  It is 2.5 x 6 cm in size on the forehead.  Necrotic by no sign of infection. No bone exposed.    She has multiple medical conditions as listed below. She does [place] tobacco according to the records.  Past Medical History:  Diagnosis Date  . Anxiety   . Arthropathy, unspecified, site unspecified   . Blind loop syndrome   . Carotid artery disease ([place])    documented occlusion of left ICA  . Coronary artery disease    a. known 60-70% mid-LAD stenosis with caths in 1999, 2002, 2006, and 2012 showing stable anatomy b. low-risk NST in 10/2015  . Depression   . Diabetes insipidus ([place])   . Diabetes mellitus   . Diverticulosis   . Dizziness    chronic  . Dyslipidemia   . Dyspnea    with exertion  . Fatty liver   . GERD (gastroesophageal reflux disease)    hiatial hernia  . Hepatomegaly   . Hiatal hernia   . HTN (hypertension)   . Hyperlipidemia   . Hypertension   . Irritable bowel syndrome   . Melanosis coli   . Metatarsal fracture right  . Normal nuclear stress test    nml 01/03/10;06/19/07  . Other, mixed, or unspecified nondependent drug abuse, unspecified   . Pancreatitis, chronic ([place])   . Peripheral vascular disease ([place]) 02/20/10   s/p stent of left SFA  . Sleep apnea   . Stroke ([place])   . Thyroid nodule   . Unspecified essential hypertension    . Vitamin B12 deficiency     Past Surgical History:  Procedure Laterality Date  . ABDOMINAL HYSTERECTOMY     partial  . AUGMENTATION MAMMAPLASTY     bilateral 1976 retro   . BREAST ENHANCEMENT SURGERY    . CARDIAC CATHETERIZATION  07/07/98;08/31/01;03/04/05   60 -70% LAD  . CATARACT EXTRACTION    . COLON RESECTION N/A 10/14/2017   Procedure: EXPLORATORY LAPAROTOMY, EXTENDED RIGHT COLECTOMY; APPLICATION OF ABDOMINAL VACUUM DRESSING;  Surgeon: Jovita Kussmaul, MD;  Location: [facility];  Service: General;  Laterality: N/A;  . ENDOBRONCHIAL ULTRASOUND Bilateral 02/24/2017   Procedure: ENDOBRONCHIAL ULTRASOUND;  Surgeon: Collene Gobble, MD;  Location: [facility];  Service: Cardiopulmonary;  Laterality: Bilateral;  . EYE SURGERY Left Nov. 2016   Cataract  . EYE SURGERY Right 2001   Cataract  . FEMORAL ARTERY STENT     Left leg  . LAPAROTOMY N/A 10/16/2017   Procedure: EXPLORATORY LAPAROTOMY, PARTIAL OMENTECTOMY, RESECTION ISCHEMIC ILEUM 158XE, APPLICATION OF VAC ABDOMINAL DRESSING;  Surgeon: Armandina Gemma, MD;  Location: [facility];  Service: General;  Laterality: N/A;  . LAPAROTOMY N/A 10/18/2017   Procedure: RE-EXPLORATION OF ABDOMEN, ILEOSTOMY CREATION;  Surgeon: Stark Klein, MD;  Location: [facility];  Service: General;  Laterality: N/A;  .  LUNG SURGERY  03/2017   Benign polyps removed  . PATELLA REALIGNMENT Left   . TONSILLECTOMY    . TOTAL ABDOMINAL HYSTERECTOMY    . WRIST SURGERY     right    Family History  Problem Relation Age of Onset  . Hypertension Mother   . Osteoarthritis Mother   . Pulmonary embolism Mother   . Hypertension Father        aneurysm  . Stroke Father   . Stroke Unknown   . Colon cancer Paternal Grandfather     Social History:  reports that she has been smoking cigarettes.  She has a 50.00 pack-year smoking history. she has never used smokeless tobacco. She reports that she does not drink alcohol or use drugs.  Allergies:  Allergies  Allergen Reactions  .  Codeine     Altered mental state   . Iodinated Diagnostic Agents Rash    Medications: I have reviewed the patient's current medications.  Results for orders placed or performed during the hospital encounter of 10/14/17 (from the past 48 hour(s))  Glucose, capillary     Status: Abnormal   Collection Time: 10/26/17  3:49 PM  Result Value Ref Range   Glucose-Capillary 157 (H) 65 - 99 mg/dL  Glucose, capillary     Status: Abnormal   Collection Time: 10/26/17  4:28 PM  Result Value Ref Range   Glucose-Capillary 135 (H) 65 - 99 mg/dL   Comment 1 Notify RN    Comment 2 Document in Chart   Glucose, capillary     Status: Abnormal   Collection Time: 10/26/17  7:29 PM  Result Value Ref Range   Glucose-Capillary 143 (H) 65 - 99 mg/dL   Comment 1 Notify RN    Comment 2 Document in Chart   Glucose, capillary     Status: Abnormal   Collection Time: 10/26/17 11:17 PM  Result Value Ref Range   Glucose-Capillary 138 (H) 65 - 99 mg/dL   Comment 1 Notify RN    Comment 2 Document in Chart   Glucose, capillary     Status: Abnormal   Collection Time: 10/27/17  3:20 AM  Result Value Ref Range   Glucose-Capillary 131 (H) 65 - 99 mg/dL   Comment 1 Notify RN    Comment 2 Document in Chart   Comprehensive metabolic panel     Status: Abnormal   Collection Time: 10/27/17  4:28 AM  Result Value Ref Range   Sodium 142 135 - 145 mmol/L   Potassium 3.7 3.5 - 5.1 mmol/L   Chloride 112 (H) 101 - 111 mmol/L   CO2 23 22 - 32 mmol/L   Glucose, Bld 146 (H) 65 - 99 mg/dL   BUN 75 (H) 6 - 20 mg/dL   Creatinine, Ser 1.51 (H) 0.44 - 1.00 mg/dL   Calcium 8.1 (L) 8.9 - 10.3 mg/dL   Total Protein 5.8 (L) 6.5 - 8.1 g/dL   Albumin 1.8 (L) 3.5 - 5.0 g/dL   AST 238 (H) 15 - 41 U/L   ALT 145 (H) 14 - 54 U/L   Alkaline Phosphatase 186 (H) 38 - 126 U/L   Total Bilirubin 0.6 0.3 - 1.2 mg/dL   GFR calc non Af Amer 34 (L) >60 mL/min   GFR calc Af Amer 39 (L) >60 mL/min    Comment: (NOTE) The eGFR has been calculated  using the CKD EPI equation. This calculation has not been validated in all clinical situations. eGFR's persistently <60 mL/min signify  possible Chronic Kidney Disease.    Anion gap 7 5 - 15  Magnesium     Status: None   Collection Time: 10/27/17  4:28 AM  Result Value Ref Range   Magnesium 1.9 1.7 - 2.4 mg/dL  Phosphorus     Status: None   Collection Time: 10/27/17  4:28 AM  Result Value Ref Range   Phosphorus 3.9 2.5 - 4.6 mg/dL  Triglycerides     Status: Abnormal   Collection Time: 10/27/17  4:28 AM  Result Value Ref Range   Triglycerides 171 (H) <150 mg/dL  Prealbumin     Status: Abnormal   Collection Time: 10/27/17  4:28 AM  Result Value Ref Range   Prealbumin 17.3 (L) 18 - 38 mg/dL    Comment: Performed at [facility], [place] [address] [place], [place] [address]  CBC with Differential/Platelet     Status: Abnormal   Collection Time: 10/27/17  4:28 AM  Result Value Ref Range   WBC 11.7 (H) 4.0 - 10.5 K/uL   RBC 2.99 (L) 3.87 - 5.11 MIL/uL   Hemoglobin 8.4 (L) 12.0 - 15.0 g/dL   HCT 26.2 (L) 36.0 - 46.0 %   MCV 87.6 78.0 - 100.0 fL   MCH 28.1 26.0 - 34.0 pg   MCHC 32.1 30.0 - 36.0 g/dL   RDW 17.2 (H) 11.5 - 15.5 %   Platelets 315 150 - 400 K/uL   Neutrophils Relative % 75 %   Neutro Abs 8.8 (H) 1.7 - 7.7 K/uL   Lymphocytes Relative 19 %   Lymphs Abs 2.2 0.7 - 4.0 K/uL   Monocytes Relative 4 %   Monocytes Absolute 0.5 0.1 - 1.0 K/uL   Eosinophils Relative 1 %   Eosinophils Absolute 0.1 0.0 - 0.7 K/uL   Basophils Relative 1 %   Basophils Absolute 0.1 0.0 - 0.1 K/uL  Glucose, capillary     Status: Abnormal   Collection Time: 10/27/17  7:54 AM  Result Value Ref Range   Glucose-Capillary 144 (H) 65 - 99 mg/dL   Comment 1 Notify RN    Comment 2 Document in Chart   Glucose, capillary     Status: Abnormal   Collection Time: 10/27/17 11:31 AM  Result Value Ref Range   Glucose-Capillary 109 (H) 65 - 99 mg/dL   Comment 1 Notify RN    Comment 2 Document in Chart     Glucose, capillary     Status: Abnormal   Collection Time: 10/27/17  3:07 PM  Result Value Ref Range   Glucose-Capillary 153 (H) 65 - 99 mg/dL   Comment 1 Notify RN    Comment 2 Document in Chart   Glucose, capillary     Status: Abnormal   Collection Time: 10/27/17  7:19 PM  Result Value Ref Range   Glucose-Capillary 100 (H) 65 - 99 mg/dL  Glucose, capillary     Status: Abnormal   Collection Time: 10/27/17 11:35 PM  Result Value Ref Range   Glucose-Capillary 145 (H) 65 - 99 mg/dL  Glucose, capillary     Status: Abnormal   Collection Time: 10/28/17  3:22 AM  Result Value Ref Range   Glucose-Capillary 129 (H) 65 - 99 mg/dL  CBC     Status: Abnormal   Collection Time: 10/28/17  5:00 AM  Result Value Ref Range   WBC 8.9 4.0 - 10.5 K/uL   RBC 2.88 (L) 3.87 - 5.11 MIL/uL   Hemoglobin 8.1 (L) 12.0 - 15.0  g/dL   HCT 25.6 (L) 36.0 - 46.0 %   MCV 88.9 78.0 - 100.0 fL   MCH 28.1 26.0 - 34.0 pg   MCHC 31.6 30.0 - 36.0 g/dL   RDW 17.4 (H) 11.5 - 15.5 %   Platelets 299 150 - 400 K/uL  Basic metabolic panel     Status: Abnormal   Collection Time: 10/28/17  5:00 AM  Result Value Ref Range   Sodium 143 135 - 145 mmol/L   Potassium 3.6 3.5 - 5.1 mmol/L   Chloride 116 (H) 101 - 111 mmol/L   CO2 20 (L) 22 - 32 mmol/L   Glucose, Bld 142 (H) 65 - 99 mg/dL   BUN 68 (H) 6 - 20 mg/dL   Creatinine, Ser 1.24 (H) 0.44 - 1.00 mg/dL   Calcium 8.3 (L) 8.9 - 10.3 mg/dL   GFR calc non Af Amer 43 (L) >60 mL/min   GFR calc Af Amer 50 (L) >60 mL/min    Comment: (NOTE) The eGFR has been calculated using the CKD EPI equation. This calculation has not been validated in all clinical situations. eGFR's persistently <60 mL/min signify possible Chronic Kidney Disease.    Anion gap 7 5 - 15  Protime-INR     Status: None   Collection Time: 10/28/17  5:00 AM  Result Value Ref Range   Prothrombin Time 14.8 11.4 - 15.2 seconds   INR 1.17   APTT     Status: None   Collection Time: 10/28/17  5:00 AM  Result  Value Ref Range   aPTT 36 24 - 36 seconds  Glucose, capillary     Status: Abnormal   Collection Time: 10/28/17  7:52 AM  Result Value Ref Range   Glucose-Capillary 127 (H) 65 - 99 mg/dL   Comment 1 Notify RN    Comment 2 Document in Chart   Glucose, capillary     Status: Abnormal   Collection Time: 10/28/17 11:37 AM  Result Value Ref Range   Glucose-Capillary 147 (H) 65 - 99 mg/dL   Comment 1 Notify RN    Comment 2 Document in Chart     Dg Chest Port 1 View  Result Date: 10/28/2017 CLINICAL DATA:  Respiratory failure. EXAM: PORTABLE CHEST 1 VIEW COMPARISON:  Radiograph of October 27, 2017. FINDINGS: Stable cardiomediastinal silhouette. Atherosclerosis of thoracic aorta is noted. Endotracheal and nasogastric tubes are unchanged in position. No pneumothorax is noted. Right-sided PICC line is unchanged in position. Minimal left basilar subsegmental atelectasis is noted which is improved compared to prior exam. Right lung is clear. Bony thorax is unremarkable. IMPRESSION: Stable support apparatus. Aortic atherosclerosis. Minimal left basilar subsegmental atelectasis. Electronically Signed   By: Marijo Conception, M.D.   On: 10/28/2017 07:27   Dg Chest Port 1 View  Result Date: 10/27/2017 CLINICAL DATA:  71 year old female with history of ischemic bowel status post multiple recent abdominal surgeries. Right lower quadrant abscess drain. Respiratory failure, intubated. EXAM: PORTABLE CHEST 1 VIEW COMPARISON:  Portable chest 10/26/2017, CT Abdomen and Pelvis 10/25/2017, and earlier. FINDINGS: Portable AP semi upright view at 0514 hours. More kyphotic positioning today. Stable endotracheal tube tip at the level the clavicles. Enteric tube courses to the left abdomen, tip not included. Stable right subclavian central line. New line lung volumes and mediastinal contours not significantly changed. Increased appearance of bilateral patchy lung base opacity. No pneumothorax. No definite pleural effusion.  Increased pulmonary vascularity without overt edema. The Visible bowel gas pattern within normal limits.  IMPRESSION: 1.  Stable lines and tubes. 2. Patchy bibasilar pulmonary opacity appears increased since 10/25/2017 and could be atelectasis or infection. Electronically Signed   By: Genevie Ann M.D.   On: 10/27/2017 07:07    Review of Systems  Unable to perform ROS: Medical condition   Blood pressure (!) 127/39, pulse 98, temperature 98.7 F (37.1 C), temperature source Axillary, resp. rate 16, height _0  (1.626 m), weight 78.9 kg (173 lb 15.1 oz), SpO2 97 %. Physical Exam  Constitutional: She appears well-developed.  HENT:  Head: Normocephalic.    Cardiovascular: Normal rate.  Respiratory:  On vent  GI: Soft. She exhibits no distension.  Musculoskeletal: She exhibits no edema.  Neurological:  Intubated and sedated  Skin:  Wound on forehead  Psychiatric:  Sedated and intubated    Assessment/Plan: Forehead wound recommend head elevation as able.  Bactroban to the area.  Will wait 1 -2 weeks and re-evaluate.  No surgical intervention at this time.  [place]on[place] 10/28/2017, 2:14 PM

## 2017-10-28 NOTE — Procedures (Signed)
Percutaneous Tracheostomy Placement  Consent from family.  Patient sedated, paralyzed and position.  Placed on 100% FiO2 and RR matched.  Area cleaned and draped.  Lidocaine/epi injected.  Skin incision done followed by blunt dissection.  Trachea palpated then punctured, catheter passed and visualized bronchoscopically.  Wire placed and visualized.  Catheter removed.  Airway then crushed and dilated.  Size 6 cuffed shiley trach placed and visualized bronchoscopically well above carina.  Good volume returns.  Patient tolerated the procedure well without complications.  Minimal blood loss.  CXR ordered and pending.  Wesam G. Yacoub, M.D. [facility]. Pager: [phone]. After hours pager: [phone]. 

## 2017-10-28 NOTE — Procedures (Signed)
Bronchoscopy Procedure Note [name] [id number] December 11, 1945  Procedure: Bronchoscopy Indications: To facilitate perc trach placement  Procedure Details Consent: Risks of procedure as well as the alternatives and risks of each were explained to the (patient/caregiver).  Consent for procedure obtained. Time Out: Verified patient identification, verified procedure, site/side was marked, verified correct patient position, special equipment/implants available, medications/allergies/relevent history reviewed, required imaging and test results available.  Performed  In preparation for procedure, patient was given 100% FiO2. Sedation: Benzodiazepines and Muscle relaxants  FOB performed via ETT to facilitate trach placement. Please also refer to dr Pura Spice procedure note. No evidence airway injury or bleeding. Trach in good position.   Airways were somewhat friable and macerated. She may merit repeat FOB with a larger scope to give a full airway inspection.   Evaluation Hemodynamic Status: Transient hypotension treated with fluid; O2 sats: stable throughout Patient's Current Condition: stable Specimens:  None Complications: No apparent complications Patient did tolerate procedure well.   Baltazar Apo, MD, PhD 10/28/2017, 4:18 PM [facility] [phone] or if no answer [phone]

## 2017-10-28 NOTE — Progress Notes (Signed)
10 Days Post-Op  Subjective: Opens eyes to voice.  Answers questions appropriately.  Denies abdominal pain. No wound or ostomy problems.  Good ostomy output. Remains on TNA Today's lab work pending  Cultures from RLQ fluid collection are negative thus far.  Apparently then weaning efforts are unsuccessful and tentatively planning for tracheostomy today  Continues with fevers Chest x-ray today reasonably clear.  Some elevation left hemidiaphragm. We have called plastic surgery to evaluate for head wound.  Objective: Vital signs in last 24 hours: Temp:  [97.6 F (36.4 C)-101.9 F (38.8 C)] 98.5 F (36.9 C) (01/15 0325) Pulse Rate:  [59-121] 94 (01/15 0200) Resp:  [11-23] 15 (01/15 0200) BP: (118-195)/(29-69) 118/37 (01/15 0200) SpO2:  [92 %-100 %] 97 % (01/15 0300) FiO2 (%):  [30 %] 30 % (01/15 0300) Last BM Date: 10/26/17  Intake/Output from previous day: 01/14 0701 - 01/15 0700 In: 2377 [I.V.:2177; IV Piggyback:200] Out: 1400 [Urine:1325; Drains:50; Stool:25] Intake/Output this shift: Total I/O In: 1044 [I.V.:1044] Out: 425 [Urine:425]    PE General appearance:intubated and sedated.  Does open eyes and look at me.  Follows commands but only intermittently.  Bandage on for head wound.  No significant cellulitis. Resp:clear to auscultation bilaterally andon vent Cardio:regular rate and rhythm XN:ATFT, nontender. ostomy viable with some output.  Midline wound with negative pressure dressing.  Clean and fascia intact per nursing staff.   RLQ drain with serosanguineous fluid in bag.     Lab Results:  Recent Labs    10/26/17 0525 10/27/17 0428  WBC 14.7* 11.7*  HGB 9.2* 8.4*  HCT 27.6* 26.2*  PLT 317 315   BMET Recent Labs    10/26/17 0525 10/27/17 0428  NA 142 142  K 3.5 3.7  CL 107 112*  CO2 24 23  GLUCOSE 193* 146*  BUN 87* 75*  CREATININE 1.65* 1.51*  CALCIUM 7.8* 8.1*   PT/INR No results for input(s): LABPROT, INR in the last 72  hours. ABG No results for input(s): PHART, HCO3 in the last 72 hours.  Invalid input(s): PCO2, PO2  Studies/Results: Dg Chest Port 1 View  Result Date: 10/27/2017 CLINICAL DATA:  72 year old female with history of ischemic bowel status post multiple recent abdominal surgeries. Right lower quadrant abscess drain. Respiratory failure, intubated. EXAM: PORTABLE CHEST 1 VIEW COMPARISON:  Portable chest 10/26/2017, CT Abdomen and Pelvis 10/25/2017, and earlier. FINDINGS: Portable AP semi upright view at 0514 hours. More kyphotic positioning today. Stable endotracheal tube tip at the level the clavicles. Enteric tube courses to the left abdomen, tip not included. Stable right subclavian central line. New line lung volumes and mediastinal contours not significantly changed. Increased appearance of bilateral patchy lung base opacity. No pneumothorax. No definite pleural effusion. Increased pulmonary vascularity without overt edema. The Visible bowel gas pattern within normal limits. IMPRESSION: 1.  Stable lines and tubes. 2. Patchy bibasilar pulmonary opacity appears increased since 10/25/2017 and could be atelectasis or infection. Electronically Signed   By: Genevie Ann M.D.   On: 10/27/2017 07:07   Ct Image Guided Drainage By Percutaneous Catheter  Result Date: 10/26/2017 INDICATION: Right lower quadrant abscess EXAM: CT GUIDED DRAINAGE OF  ABSCESS MEDICATIONS: The patient is currently admitted to the hospital and receiving intravenous antibiotics. The antibiotics were administered within an appropriate time frame prior to the initiation of the procedure. ANESTHESIA/SEDATION: Patient was intubated COMPLICATIONS: None immediate. TECHNIQUE: Informed written consent was obtained from the patient after a thorough discussion of the procedural risks, benefits and alternatives. All  questions were addressed. Maximal Sterile Barrier Technique was utilized including caps, mask, sterile gowns, sterile gloves, sterile drape,  hand hygiene and skin antiseptic. A timeout was performed prior to the initiation of the procedure. PROCEDURE: The right lower quadrant was prepped with ChloraPrep in a sterile fashion, and a sterile drape was applied covering the operative field. A sterile gown and sterile gloves were used for the procedure. Local anesthesia was provided with 1% Lidocaine. Under CT guidance, an 18 gauge needle was inserted into the right lower quadrant abscess and removed over an Amplatz wire. Twelve [place] dilator followed by a 12 [place] drain were inserted. It was looped and string fixed then sewn to the skin. Dark fluid was aspirated. A sample was obtained and sent for culture. FINDINGS: Imaging documents placement of a 37 French drain into a right lower quadrant abscess. IMPRESSION: Successful right lower quadrant 12 French abscess drain placement. Electronically Signed   By: Marybelle Killings M.D.   On: 10/26/2017 12:43    Anti-infectives: Anti-infectives (From admission, onward)   Start     Dose/Rate Route Frequency Ordered Stop   10/25/17 1000  meropenem (MERREM) 1 g in sodium chloride 0.9 % 100 mL IVPB     1 g 200 mL/hr over 30 Minutes Intravenous Every 12 hours 10/24/17 1547     10/24/17 1600  meropenem (MERREM) 2 g in sodium chloride 0.9 % 100 mL IVPB     2 g 200 mL/hr over 30 Minutes Intravenous  Once 10/24/17 1547 10/24/17 1647   10/17/17 1200  anidulafungin (ERAXIS) 100 mg in sodium chloride 0.9 % 100 mL IVPB     100 mg 78 mL/hr over 100 Minutes Intravenous Every 24 hours 10/16/17 1110 10/22/17 1423   10/16/17 1200  anidulafungin (ERAXIS) 200 mg in sodium chloride 0.9 % 200 mL IVPB     200 mg 78 mL/hr over 200 Minutes Intravenous  Once 10/16/17 1110 10/16/17 1659   10/16/17 0200  piperacillin-tazobactam (ZOSYN) IVPB 3.375 g     3.375 g 12.5 mL/hr over 240 Minutes Intravenous Every 8 hours 10/15/17 2030 10/22/17 2121   10/15/17 1000  piperacillin-tazobactam (ZOSYN) IVPB 2.25 g  Status:  Discontinued      2.25 g 100 mL/hr over 30 Minutes Intravenous Every 8 hours 10/15/17 0823 10/15/17 2028   10/14/17 1000  piperacillin-tazobactam (ZOSYN) IVPB 3.375 g  Status:  Discontinued     3.375 g 12.5 mL/hr over 240 Minutes Intravenous Every 8 hours 10/14/17 0805 10/15/17 0823   10/14/17 0315  piperacillin-tazobactam (ZOSYN) IVPB 3.375 g     3.375 g 100 mL/hr over 30 Minutes Intravenous  Once 10/14/17 0302 10/14/17 0440   10/14/17 0315  vancomycin (VANCOCIN) IVPB 1000 mg/200 mL premix     1,000 mg 200 mL/hr over 60 Minutes Intravenous  Once 10/14/17 0303 10/14/17 0440      Assessment/Plan: s/p Procedure(s): RE-EXPLORATION OF ABDOMEN, ILEOSTOMY CREATION  Acute mesenteric ischemia. -Right colectomy1/1 (PT) -Small bowel resection 1/3 (TG) -End ileostomy and fascial wound closure 1/5 (FB)  RLQ "abscess" -successful drainage in IR 1/13. -Cultures negative thus far.  This may be a sterile collection. -on Merrem  CVA per neurology  VDRF per CCM - tracheostomy planned according to nursing staff.  Open wound of Forehead.   -Said to be full-thickness necrosis secondary to one of the probes during hypotensive episode on pressors. -We have asked plastic surgery to take a look and suggest management.     LOS: 14 days  Renelda Loma Alyssa [place] 10/28/2017

## 2017-10-29 ENCOUNTER — Inpatient Hospital Stay (HOSPITAL_COMMUNITY): Payer: Medicare Other

## 2017-10-29 LAB — COMPREHENSIVE METABOLIC PANEL
ALK PHOS: 148 U/L — AB (ref 38–126)
ALT: 145 U/L — ABNORMAL HIGH (ref 14–54)
ANION GAP: 4 — AB (ref 5–15)
AST: 131 U/L — ABNORMAL HIGH (ref 15–41)
Albumin: 1.7 g/dL — ABNORMAL LOW (ref 3.5–5.0)
BILIRUBIN TOTAL: 0.6 mg/dL (ref 0.3–1.2)
BUN: 62 mg/dL — ABNORMAL HIGH (ref 6–20)
CALCIUM: 8.3 mg/dL — AB (ref 8.9–10.3)
CO2: 18 mmol/L — ABNORMAL LOW (ref 22–32)
Chloride: 120 mmol/L — ABNORMAL HIGH (ref 101–111)
Creatinine, Ser: 1.01 mg/dL — ABNORMAL HIGH (ref 0.44–1.00)
GFR calc Af Amer: >60 mL/min (ref >60)
GFR calc non Af Amer: 55 mL/min — ABNORMAL LOW (ref >60)
Glucose, Bld: 132 mg/dL — ABNORMAL HIGH (ref 65–99)
POTASSIUM: 3.6 mmol/L (ref 3.5–5.1)
Sodium: 142 mmol/L (ref 135–145)
TOTAL PROTEIN: 5.5 g/dL — AB (ref 6.5–8.1)

## 2017-10-29 LAB — CULTURE, BLOOD (ROUTINE X 2)
CULTURE: NO GROWTH
Culture: NO GROWTH
SPECIAL REQUESTS: ADEQUATE
SPECIAL REQUESTS: ADEQUATE

## 2017-10-29 LAB — GLUCOSE, CAPILLARY
GLUCOSE-CAPILLARY: 126 mg/dL — AB (ref 65–99)
GLUCOSE-CAPILLARY: 130 mg/dL — AB (ref 65–99)
Glucose-Capillary: 132 mg/dL — ABNORMAL HIGH (ref 65–99)
Glucose-Capillary: 137 mg/dL — ABNORMAL HIGH (ref 65–99)
Glucose-Capillary: 148 mg/dL — ABNORMAL HIGH (ref 65–99)

## 2017-10-29 LAB — CBC
HEMATOCRIT: 25.3 % — AB (ref 36.0–46.0)
HEMOGLOBIN: 8.1 g/dL — AB (ref 12.0–15.0)
MCH: 28.6 pg (ref 26.0–34.0)
MCHC: 32 g/dL (ref 30.0–36.0)
MCV: 89.4 fL (ref 78.0–100.0)
Platelets: 309 10*3/uL (ref 150–400)
RBC: 2.83 MIL/uL — ABNORMAL LOW (ref 3.87–5.11)
RDW: 17.8 % — ABNORMAL HIGH (ref 11.5–15.5)
WBC: 10.6 10*3/uL — ABNORMAL HIGH (ref 4.0–10.5)

## 2017-10-29 LAB — MAGNESIUM
MAGNESIUM: 1.9 mg/dL (ref 1.7–2.4)
MAGNESIUM: 1.7 mg/dL (ref 1.7–2.4)

## 2017-10-29 LAB — PHOSPHORUS: PHOSPHORUS: 2.1 mg/dL — AB (ref 2.5–4.6)

## 2017-10-29 MED ORDER — INSULIN REGULAR HUMAN 100 UNIT/ML IJ SOLN
INTRAVENOUS | Status: AC
  Administered 2017-10-29: 17:00:00 via INTRAVENOUS
  Filled 2017-10-29 (×2): qty 960

## 2017-10-29 MED ORDER — TAB-A-VITE/IRON PO TABS
1.0000 | ORAL_TABLET | Freq: Every day | ORAL | Status: DC
  Administered 2017-10-30 – 2017-11-04 (×6): 1 via ORAL
  Filled 2017-10-29 (×6): qty 1

## 2017-10-29 MED ORDER — FUROSEMIDE 10 MG/ML IJ SOLN
40.0000 mg | Freq: Once | INTRAMUSCULAR | Status: AC
  Administered 2017-10-29: 40 mg via INTRAVENOUS
  Filled 2017-10-29: qty 4

## 2017-10-29 MED ORDER — SODIUM CHLORIDE 0.9 % IV SOLN
1.0000 g | Freq: Three times a day (TID) | INTRAVENOUS | Status: DC
  Administered 2017-10-29 – 2017-11-01 (×10): 1 g via INTRAVENOUS
  Filled 2017-10-29 (×12): qty 1

## 2017-10-29 MED ORDER — SODIUM BICARBONATE 8.4 % IV SOLN
INTRAVENOUS | Status: DC
  Administered 2017-10-29 – 2017-10-31 (×3): via INTRAVENOUS
  Filled 2017-10-29 (×3): qty 150

## 2017-10-29 MED ORDER — SODIUM BICARBONATE 8.4 % IV SOLN: INTRAVENOUS | Status: DC

## 2017-10-29 MED ORDER — POTASSIUM PHOSPHATES 15 MMOLE/5ML IV SOLN
20.0000 meq | Freq: Once | INTRAVENOUS | Status: AC
  Administered 2017-10-29: 20 meq via INTRAVENOUS
  Filled 2017-10-29: qty 4.55

## 2017-10-29 MED ORDER — VITAL AF 1.2 CAL PO LIQD
1000.0000 mL | ORAL | Status: DC
  Administered 2017-10-29: 1000 mL
  Filled 2017-10-29 (×2): qty 1000

## 2017-10-29 MED ORDER — COLLAGENASE 250 UNIT/GM EX OINT
TOPICAL_OINTMENT | Freq: Every day | CUTANEOUS | Status: DC
  Administered 2017-10-29 – 2017-10-31 (×3): 1 via TOPICAL
  Administered 2017-11-01 – 2017-11-05 (×4): via TOPICAL
  Administered 2017-11-06: 1 via TOPICAL
  Filled 2017-10-29: qty 90

## 2017-10-29 NOTE — Progress Notes (Signed)
Patient placed on ATC 35% and 8L O2. Currently sating 99%. Vent on standby. RT will continue to monitor patient.

## 2017-10-29 NOTE — Progress Notes (Signed)
Concern for worsening left sided facial droop. NP made aware. No new orders at this time. Will continue to monitor closely.

## 2017-10-29 NOTE — Progress Notes (Signed)
Pharmacy Antibiotic Note  [name] is a 72 y.o. female admitted on 10/14/2017 with ischemic bowel on broad-spectrum abx and completed Eraxis.  Pharmacy is consulted for meropenem dosing.  Today, 10/29/2017: Tm 99.1, has remained afebrile since fever on 1/14 WBC 10.6 SCr improved to 1.01, CrCl ~ 52 ml/min  Plan: Increase to Meropenem 1g IV q8h Follow up renal fxn, culture results, and clinical course.  Height: 5\' 4"  (162.6 cm) Weight: 173 lb 4.5 oz (78.6 kg) IBW/kg (Calculated) : 54.7  Temp (24hrs), Avg:98.5 F (36.9 C), Min:97.6 F (36.4 C), Max:99.1 F (37.3 C)  Recent Labs  Lab 10/23/17 0438  10/25/17 0440 10/25/17 2312 10/26/17 0525 10/27/17 0428 10/28/17 0500 10/29/17 0536  WBC 18.5*  --   --   --  14.7* 11.7* 8.9 10.6*  CREATININE 2.01*   < > 1.80*  --  1.65* 1.51* 1.24* 1.01*  LATICACIDVEN  --   --   --  1.5  --   --   --   --    < > = values in this interval not displayed.    Estimated Creatinine Clearance: 52.6 mL/min (A) (by C-G formula based on SCr of 1.01 mg/dL (H)).    Allergies  Allergen Reactions  . Codeine     Altered mental state   . Iodinated Diagnostic Agents Rash    Antimicrobials this admission:  1/1 vanc x 1 0329 1/1 zosyn >> 1/9 1/3 Anidulafungin >>1/9 1/11 Meropenem >>   Dose change: 1/2 Zosyn changed back to [facility] for CrCl > 20 1/16 meropenem increased based on improved renal fxn  Microbiology results:  1/1 Ucx: ng-final 1/1 BCx: ng-final 1/1 MRSA PCR: neg 1/7 Cath tip cx: NGF 1/7 BCx: NGF 1/11 BCx: ngtd 1/13 Abdomen: ngtd 24 hrs  Thank you for allowing pharmacy to be a part of this patient's care.  Gretta Arab PharmD, BCPS Pager [phone] 10/29/2017 11:02 AM

## 2017-10-29 NOTE — Progress Notes (Signed)
11 Days Post-Op  Subjective:  Opens eyes to voice and nods understanding that I am there. Afebrile.  Hemodynamically stable.  Bedside tracheostomy by CCM performed yesterday.  Seems to have gone well Appreciate Lasix surgery consultation.  Wound to be treated with Bactroban in the short-term.  No surgery now  I have advised to feedings may be restarted.  Currently receiving vital high protein liquid at 40 mL per hour. Ileostomy functioning normally.  Right lower quadrant fluid collection still serosanguineous.  Culture still negative.  Objective: Vital signs in last 24 hours: Temp:  [97.6 F (36.4 C)-99.1 F (37.3 C)] 98.3 F (36.8 C) (01/16 0339) Pulse Rate:  [75-132] 131 (01/16 0400) Resp:  [8-28] 21 (01/16 0400) BP: (77-237)/(36-143) 137/60 (01/16 0400) SpO2:  [94 %-100 %] 99 % (01/16 0400) FiO2 (%):  [30 %-100 %] 40 % (01/16 0400) Weight:  [82.9 kg (182 lb 12.2 oz)] 82.9 kg (182 lb 12.2 oz) (01/15 1500) Last BM Date: 10/26/17  Intake/Output from previous day: 01/15 0701 - 01/16 0700 In: 492.1 [I.V.:442.1; IV Piggyback:50] Out: 1955 [Urine:1700; Emesis/NG output:75; Drains:30; Stool:150] Intake/Output this shift: Total I/O In: 184.6 [I.V.:134.6; IV Piggyback:50] Out: 600 [Urine:600]  PE General appearance:intubated and sedated.Does open eyes and look at me. Follows commands but only intermittently.  Bandage on for head wound.  Clean  No significant cellulitis. Resp:clear to auscultation bilaterally andon vent.  Tracheostomy site looks good.  No bleeding. Cardio:regular rate and rhythm WN:UUVO, nontender. ostomy viable with some output.Midline wound with negative pressure dressing.  Clean and fascia intact per nursing staff.  RLQ drain with serosanguineous fluid in bag.  30 mL out.      Lab Results:  Results for orders placed or performed during the hospital encounter of 10/14/17 (from the past 24 hour(s))  Glucose, capillary     Status: Abnormal    Collection Time: 10/28/17  7:52 AM  Result Value Ref Range   Glucose-Capillary 127 (H) 65 - 99 mg/dL   Comment 1 Notify RN    Comment 2 Document in Chart   Glucose, capillary     Status: Abnormal   Collection Time: 10/28/17 11:37 AM  Result Value Ref Range   Glucose-Capillary 147 (H) 65 - 99 mg/dL   Comment 1 Notify RN    Comment 2 Document in Chart   Glucose, capillary     Status: Abnormal   Collection Time: 10/28/17  4:10 PM  Result Value Ref Range   Glucose-Capillary 117 (H) 65 - 99 mg/dL  Magnesium     Status: Abnormal   Collection Time: 10/28/17  5:15 PM  Result Value Ref Range   Magnesium 1.6 (L) 1.7 - 2.4 mg/dL  Glucose, capillary     Status: Abnormal   Collection Time: 10/28/17  7:45 PM  Result Value Ref Range   Glucose-Capillary 149 (H) 65 - 99 mg/dL   Comment 1 Notify RN    Comment 2 Document in Chart   Blood gas, arterial     Status: Abnormal   Collection Time: 10/28/17  8:28 PM  Result Value Ref Range   FIO2 100.00    Delivery systems VENTILATOR    Mode PRESSURE REGULATED VOLUME CONTROL    VT 460 mL   LHR 16 resp/min   Peep/cpap 5.0 cm H20   pH, Arterial 7.285 (L) 7.350 - 7.450   pCO2 arterial 39.6 32.0 - 48.0 mmHg   pO2, Arterial 503 (H) 83.0 - 108.0 mmHg   Bicarbonate 18.2 (L) 20.0 - 28.0  mmol/L   Acid-base deficit 7.4 (H) 0.0 - 2.0 mmol/L   O2 Saturation 99.2 %   Patient temperature 98.6    Collection site LEFT RADIAL    Drawn by [id number]    Sample type ARTERIAL DRAW    Allens test (pass/fail) PASS PASS  Glucose, capillary     Status: Abnormal   Collection Time: 10/28/17 11:32 PM  Result Value Ref Range   Glucose-Capillary 125 (H) 65 - 99 mg/dL   Comment 1 Notify RN    Comment 2 Document in Chart   Glucose, capillary     Status: Abnormal   Collection Time: 10/29/17  3:28 AM  Result Value Ref Range   Glucose-Capillary 126 (H) 65 - 99 mg/dL     Studies/Results: Dg Abd 1 View  Result Date: 10/29/2017 CLINICAL DATA:  72 year old female status post  enteric tube placement. EXAM: ABDOMEN - 1 VIEW; PORTABLE CHEST - 1 VIEW COMPARISON:  Radiograph dated 10/28/2017 FINDINGS: Enteric tube with side port in the distal esophagus and tip in the region of the gastroesophageal junction. Recommend advancing the tube further into the stomach by approximately 7 cm. Tracheostomy tube above the carina. Right IJ central line with tip in the region of the cavoatrial junction. Shallow inspiration. No focal consolidation, pleural effusion, or pneumothorax. The cardiac silhouette is within normal limits. The visualized upper abdomen is grossly unremarkable. IMPRESSION: Enteric tube with side port in the distal esophagus and tip at the gastroesophageal junction. Recommend advancing the tube further into the stomach. Electronically Signed   By: Anner Crete M.D.   On: 10/29/2017 04:58   Dg Abd 1 View  Result Date: 10/28/2017 CLINICAL DATA:  72 y/o  F; nasogastric tube adjustment. EXAM: ABDOMEN - 1 VIEW COMPARISON:  10/28/2017 abdomen radiograph FINDINGS: Enteric tube tip projects over gastric body, now well position. Right central venous catheter tip projects over lower SVC. Surgical drain projects over right lower quadrant. IMPRESSION: Enteric tube tip projects over gastric body, now well positioned and uncoiled. Electronically Signed   By: Kristine Garbe M.D.   On: 10/28/2017 23:42   Dg Abd 1 View  Result Date: 10/28/2017 CLINICAL DATA:  72 y/o  F; nasogastric tube placement. EXAM: ABDOMEN - 1 VIEW COMPARISON:  10/28/2017 abdomen radiograph FINDINGS: Further insertion of nasogastric tube with the tip still coiled back to the gastroesophageal junction. Stable bowel gas pattern. Drain in the right lower quadrant. IMPRESSION: Further insertion of nasogastric tube with the tip still coiled back to the gastroesophageal junction. Electronically Signed   By: Kristine Garbe M.D.   On: 10/28/2017 22:41   Dg Abd 1 View  Result Date: 10/28/2017 CLINICAL  DATA:  Nasogastric tube adjustment. EXAM: ABDOMEN - 1 VIEW COMPARISON:  10/28/2017 at 1729 hours FINDINGS: The nasal/orogastric tube has been further inserted. However, the tip remains at the gastroesophageal junction. The side hole lies below this well within the stomach. IMPRESSION: 1. Nasogastric tube has been further inserted. It remains curled with its distal tip still present at the gastroesophageal junction. Electronically Signed   By: Lajean Manes M.D.   On: 10/28/2017 19:43   Dg Abd 1 View  Result Date: 10/28/2017 CLINICAL DATA:  Encounter for NG tube placement EXAM: ABDOMEN - 1 VIEW COMPARISON:  None. FINDINGS: Nasogastric tube tip is coiled at the gastroesophageal junction. Visualized bowel gas pattern within the upper abdomen is nonobstructive. Lung bases appear clear. IMPRESSION: Nasogastric tube tip coiled at the gastroesophageal junction. Recommend advancing. Electronically Signed   By:  Franki Cabot M.D.   On: 10/28/2017 17:53   Dg Chest Port 1 View  Result Date: 10/29/2017 CLINICAL DATA:  72 year old female status post enteric tube placement. EXAM: ABDOMEN - 1 VIEW; PORTABLE CHEST - 1 VIEW COMPARISON:  Radiograph dated 10/28/2017 FINDINGS: Enteric tube with side port in the distal esophagus and tip in the region of the gastroesophageal junction. Recommend advancing the tube further into the stomach by approximately 7 cm. Tracheostomy tube above the carina. Right IJ central line with tip in the region of the cavoatrial junction. Shallow inspiration. No focal consolidation, pleural effusion, or pneumothorax. The cardiac silhouette is within normal limits. The visualized upper abdomen is grossly unremarkable. IMPRESSION: Enteric tube with side port in the distal esophagus and tip at the gastroesophageal junction. Recommend advancing the tube further into the stomach. Electronically Signed   By: Anner Crete M.D.   On: 10/29/2017 04:58   Dg Chest Port 1 View  Result Date:  10/28/2017 CLINICAL DATA:  Post tracheostomy tube placement today EXAM: PORTABLE CHEST 1 VIEW COMPARISON:  Portable exam 1541 hours compared to 10/28/2017 FINDINGS: New tracheostomy tube with tip projecting over tracheal air column. RIGHT arm PICC line with tip projecting over SVC. Normal heart size, mediastinal contours, and pulmonary vascularity. Atherosclerotic calcification aorta. Lungs clear. No pleural effusion or pneumothorax. IMPRESSION: No acute abnormalities. Electronically Signed   By: Lavonia Dana M.D.   On: 10/28/2017 16:03   Dg Chest Port 1 View  Result Date: 10/28/2017 CLINICAL DATA:  Respiratory failure. EXAM: PORTABLE CHEST 1 VIEW COMPARISON:  Radiograph of October 27, 2017. FINDINGS: Stable cardiomediastinal silhouette. Atherosclerosis of thoracic aorta is noted. Endotracheal and nasogastric tubes are unchanged in position. No pneumothorax is noted. Right-sided PICC line is unchanged in position. Minimal left basilar subsegmental atelectasis is noted which is improved compared to prior exam. Right lung is clear. Bony thorax is unremarkable. IMPRESSION: Stable support apparatus. Aortic atherosclerosis. Minimal left basilar subsegmental atelectasis. Electronically Signed   By: Marijo Conception, M.D.   On: 10/28/2017 07:27    .  stroke: mapping our early stages of recovery book   Does not apply Once  . aspirin  81 mg Oral Daily  . chlorhexidine  15 mL Mouth Rinse BID  . Chlorhexidine Gluconate Cloth  6 each Topical Daily  . Chlorhexidine Gluconate Cloth  6 each Topical Daily  . feeding supplement (PRO-STAT SUGAR FREE 64)  30 mL Per Tube BID  . feeding supplement (VITAL HIGH PROTEIN)  1,000 mL Per Tube Q24H  . heparin injection (subcutaneous)  5,000 Units Subcutaneous Q8H  . insulin aspart  0-15 Units Subcutaneous Q4H  . ipratropium-albuterol  3 mL Nebulization Q6H  . mouth rinse  15 mL Mouth Rinse 10 times per day  . neomycin-bacitracin-polymyxin   Topical Daily  . pantoprazole  (PROTONIX) IV  40 mg Intravenous Daily  . sodium chloride flush  5 mL Intravenous Q8H     Assessment/Plan: s/p Procedure(s): RE-EXPLORATION OF ABDOMEN, ILEOSTOMY CREATION  Acute mesenteric ischemia. -Right colectomy1/1 (PT) -Small bowel resection1/3 (TG) -End ileostomy and fascial wound closure1/5 (FB) -Tube feedings restarted.  RLQ "abscess"-successful drainage in IR 1/13. -Cultures negative thus far.  This may be a sterile collection. -on Merrem -Drain management per IR  CVA per neurology  VDRF per CCM - tracheostomy 1/15.  Open wound of Forehead.   -Appreciate plastic surgical advice and management from Dr. Aurea Graff.    @PROBHOSP @  LOS: 15 days    Adin Hector 10/29/2017  . Marland Kitchen  prob

## 2017-10-29 NOTE — Progress Notes (Signed)
Nutrition Follow-up  DOCUMENTATION CODES:   Not applicable  INTERVENTION:  - Will order Vital AF 1.2 @ 40 mL/hr. This will provide 1152 kcal, 72 grams of protein, and 778 mL free water. - Recommend decrease TPN to Clinimix 5/15 @ 40 mL/hr and stop ILE. This will provide 682 kcal and 48 grams of protein. - Total nutrition support regimen will provide 1834 kcal and 120 grams of protein.  NUTRITION DIAGNOSIS:   Inadequate oral intake related to inability to eat as evidenced by NPO status. -ongoing  GOAL:   Patient will meet greater than or equal to 90% of their needs -met with TPN regimen at this time.   MONITOR:   Vent status, TF tolerance, Weight trends, Labs, Skin, Other (Comment)(TPN regimen)  REASON FOR ASSESSMENT:   Consult Enteral/tube feeding initiation and management  ASSESSMENT:   Pt with PMH of GERD, DM, HTN, PVD, HLD, HTN presents with abdominal pain found necrosis of proximal colon. Pt is s/p emergent ex lap with extended right colectomy and application of abdominal vacuum dressing on 10/14/17. Pt required intubation r/t post op respiratory failure.   SIGNIFICANT EVENTS: 1/1- exploratory laparotomy, right colectomy with wound vac placement 1/3- small bowel ischemia, resected ischemic ileum, wound vac replaced, TPN inititated 1/5- re-exploration of abdomen with end proximal ileostomy and closure of hernia 1/9- extubated and OGT removed 1/11- ILE added 1/12 - trickle feeds started, Vital HP @ 20 ml/hr per MD 1/13- abdominal abscess drained 1/15- trach  1/16- able to re-start TF   1/16  Pt remains intubated with NGT placed in R nare yesterday. Review of imaging reports states that tip of tube is in the stomach (but plan to repeat KUB today as RN reports to RD that tube has needed to be reinserted and then readvanced several times d/t pt pulling at tube). Also states moderate amount of stool noted in the colon. Current weight consistent with admission weight; many  weight fluctuations since admission. No family/visitors present. Consult received to re-start TF and if pt tolerates, may be able to d/c TPN within 24-48 hours. She is currently receiving Clinimix 5/15 @ 83 mL/hr with 20% ILE @ 20 mL/hr x12 hours. This regimen is providing 1894 kcal and 100 grams of protein. Will order TF as outlined above and recommendations for TPN above. RN reports that pt had been tolerating Vital High Protein @ 40 mL/hr earlier in the day.   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  Patient is currently intubated on ventilator support via trach MV: 13.4 L/min Temp (24hrs), Avg:98.5 F (36.9 C), Min:97.6 F (36.4 C), Max:99.1 F (37.3 C) BP: 151/49 and MAP: 87  Medications reviewed; 20 mg IV Pepcid BID, 40 mg IV Lasix x1 run today, sliding scale Novolog, 2 g IV Mg sulfate x1 run today, 40 mg IV Protonix/day, 20 mEq IV KPhos x1 run today, 10 mEq IV KCl x4 runs yesterday. Labs reviewed; CBGs: 126 and 130 mg/dL this AM, Cl: 120 mmol/L, BUN: 62 mg/dL, creatinine: 1.01 mg/dL, Ca: 8.3 mg/dL, Phos: 2.1 mg/dL, Alk Phos elevated, LFTs elevated, GFR: 55 mL/min, triglycerides (on 1/14): 171 mg/dL.  IVF: D5-150 mEq sodium bicarb @ 50 mL/hr (204 kcal). Drip: Fentanyl @ 100 mcg/hr.      1/13 - Trickle feeds started 1/12 but stopped per surgery d/t need for drain placement.  - Vital HP @ 20 ml/hr was previously being  provided. - TPN continues: electrolyte-free Clinimix5/15at 83 ml/hr and 20% ILE @ 20 ml/hr. - Pt awaiting trach placement sometime this week per chart review.  - Weight is now -19 lb since 1/11.  - No longer meets criteria for obesity.   Patient is currently intubated on ventilator support MV: 13 L/min Temp (24hrs), Avg:100.2 F (37.9 C), Min:99 F (37.2 C), Max:101.9 F (38.8 C)    1/11 - Diet advanced from NPO to CLD today at 8:46 AM; no  intakes yet. ILE added to TPN regimen today.  - Pt now receiving Clinimix E 5/15 @ 83 mL/hr with 20% ILE @ 20 mL/hr x12 to start tonight.  - This regimen will provide 100 grams of protein and 1894 kcal. - This will meet 100% estimated nutrition needs. - Per Dr. Anastasia Pall note about 30 minutes ago, pt with L-sided facial droop which is different from yesterday; concern for possible stroke and plan for head CT and Code Stroke.  - Hypomagnesemia resolved from yesterday, persistent hypokalemia with no improvement in serum K following Mg and K runs yesterday.   Medication; 10 mEq IV KCl x4 runs yesterday and x6 runs today. Lab; K: 2.7 mmol/L   Diet Order:  Diet NPO time specified TPN (CLINIMIX) Adult without lytes  EDUCATION NEEDS:   No education needs have been identified at this time  Skin:  Skin Assessment: Skin Integrity Issues: Skin Integrity Issues:: Stage II, Unstageable Stage II: back and L upper face; R unspecified area Unstageable: unknown location Incisions: abdominal   Last BM:  1/16 via ileostomy  Height:   Ht Readings from Last 1 Encounters:  10/21/17 _0  (1.626 m)    Weight:   Wt Readings from Last 1 Encounters:  10/29/17 173 lb 4.5 oz (78.6 kg)    Ideal Body Weight:  54.5 kg  BMI:  Body mass index is 29.74 kg/m.  Estimated Nutritional Needs:   Kcal:  6503  Protein:  94-118 grams (1.2-1.5 grams/kg)  Fluid:  1.8L/day     Jarome Matin, MS, RD, LDN, CNSC Inpatient Clinical Dietitian Pager # [phone] After hours/weekend pager # [phone]

## 2017-10-29 NOTE — Progress Notes (Signed)
Patient transported to and from CT on full support without complication.

## 2017-10-29 NOTE — Consult Note (Signed)
[place] Nurse wound consult note: I am assisted in the dressing changes and skin assessments today by WTA-C Fara Olden and WTA Griffin Dakin.  Brooke Meuth, CCS PA is in to photograph wound midline wound and ostomy and assess patient during my visit.  [place] Nurse ostomy follow up Stoma type/location: RLQ ileostomy Stomal assessment/size: 1 and 1/8 inch red, round, slightly raised, os at center Peristomal assessment: intact Treatment options for stomal/peristomal skin: skin barrier ring Output: green/brown effluent Ostomy pouching: 1pc. Convex drainable pouch with skin barrier ring.  Incorporate VAC dressing drape beneath medial belt loop of ostomy pouching system. Education provided: Critically ill patient on ventilator.  No teaching performed. Enrolled patient in [place] Discharge program:No  [place] Nurse wound follow up Wound type: Midline surgical Measurement: 19cm x 3cm x 2.5cm Wound bed:80% red, 20% with yellow slough.  Suture visible in center. Drainage (amount, consistency, odor) Small amount serous to serosanguinous Periwound:with mild erythema from drape and foam contact. Placed drape as protective layer in periwound also used skin barrier ring at base to protect skin, Dressing procedure/placement/frequency: One piece of black foam removed, wound cleansed with NS. 1 piece of black foam used to obliterate dead space, drape applied and attached to 136mmHg continuous negative pressure. An immediate seal is achieved.  Wound care is now M/W/F, next planned dressing change is Friday, January 18.  [place] Nurse wound follow up Wound type: Medical device related pressure injury to forehead. Dr. Marla Roe (Plastics) in to see.  Using neosporin and silicone dressing at this time.  [place] Nurse wound follow up Wound type: Medical adhesive related skin injury to intermammary area on right Reepithelialization is near-complete. Thin film dressing replaced.  [place] Nurse wound follow  up Wound type:Pressure, perfusion Measurement:5cm x 7cm with depth obscured by the presence of thin layer of yellow slough Wound bed:See above Drainage (amount, consistency, odor) small to moderate serous Periwound: intact with erythema and evidence of previous wound healing. Dressing procedure/placement/frequency: I will add a thin layer of collagenase (Santyl) today to dissolve the thin layer of fibrinous slough.  [place] Nurse wound consult note Reason for Consult:oral pressure injuries to tongue from tooth and endotracheal tube Wound type: pressure Pressure Injury POA: No Measurement:tooth related ulceration measures 1cm round and is full thickness, I am unable to fully appreciate the additional tongue ulceration.  Patient had trach placed yesterday and area is noted by respiratory therapist. Oral care is being performed per protocol with house product. Dressing procedure/placement/frequency: Suggest consultation with ENT for assessment of oral cavity, ulcerations and recommendations for the POC. If you agree, please order/arrange.   [place] nursing team will continue to follow, and will remain available to this patient, the nursing, surgical and medical teams.   Thanks, Maudie Flakes, MSN, RN, [place], Arther Abbott  Pager# [phone]              :

## 2017-10-29 NOTE — Progress Notes (Signed)
[place] for TPN  Indication: extensive large and small bowel resection  Patient Measurements: Height: _0  (162.6 cm) Weight: 173 lb 4.5 oz (78.6 kg) IBW/kg (Calculated) : 54.7 TPN AdjBW (KG): 60.8 Body mass index is 29.74 kg/m.  Insulin Requirements: Hx of DM. 10 units SSI/24 hrs,  Reg insulin 30 units in TPN.   Current Nutrition:  TPN at goal Tube feeds started on 1/15 s/p tracheostomy.  Vital HP at 40 ml/hr provides ~960 kcal and 84 g protein.  IVF: NaBicarb 150 mEq in D5w at 50 ml/hr.  Central access: CVC from 1/1-1/7, PICC triple lumen 1/7 TPN start date: 1/3  ASSESSMENT                                                                                                          HPI: 72 yoF to ED 1/1 with abd pain, pneumatosis R colon.   Ischemic colon/bowel with peritonitis s/p washout.  Hx of CAD, DM, Diverticulosis, HPL, HTN, chronic pancreatitis noted CVA  Significant events:  1/1 OR exp lap. R colectomy, wound vac 1/3 return to OR- expl lap > sm bowel ischemia, resected ischemic ileum > VAC replaced. Begin TPN  1/5 OR Re-exploration of abdomen with end proximal ileostomy and closure of abdominal wall hernia 1/7 CVC removed, cath tip and blood culture for persistent fever. PICC placed for TPN. 1/11 acute stroke, intubated 1/13 CT-guided drainage of RLQ fluid collection 1/15 Tracheostomy  Today:   Glucose - DM on Victoza PTA, currently on SSI and insulin in TPN.  CBGs now at goal < 150; Range 117-149  Electrolytes - Na, K, CorrCa WNL.  Phos decreased, Mag WNL but low end of goal; currently on electrolyte-free TPN.  Renal - SCr improved to 1.01; BUN elevated but improved.  Volume overload, lasix today.  UOP decreased to -17 mL/24 hrs, increased stool/drain/NG output.  LFTs - AST/ALT and Alk Phos elevated but improved.  Bilirubin now improved to WNL.  TGs - were 349 on 1/1, Propofol d/c 1/2, Trig 366 (1/4), 168  (1/7), 198 (1/12), 171 (1/14)  Prealbumin - < 5  remains low d/t critical illness/inflammation (1/7), 17.3 (1/14)  NUTRITIONAL GOALS                                                                                             RD recs: 10/17/17 Kcal 7782-4235 , protein 109-119 gm  Hold 20% lipid emulsion for first 7 days for ICU patients per ASPEN guidelines (Start date 10/24/17) Clinimix 5/15 at a goal rate of 83 ml/hr to provide:1414 kcal and 100 gm protein.  111% kcal and 92 % protein.  PLAN                                                                                                                   Now:  KPhos 38mq IV x1 per MD Magnesium 2g IV x1  At 1800 today:  Decrease to 1/2 rate TPN tonight, plan to wean off tomorrow if she continues to tolerate tube feeds.    Change to Clinimix 5/15 WITH electrolytes at 40 ml/hr  Stop lipids  Continue regular insulin in TPN but decrease to 1/2 dose along with 1/2 rate TPN.  Patient will receive 15 units / 24 hour bag.  Transition from MVI/TE in TPN to per tube.  Continue SSI moderate scale q4h  IVF per MD.  TPN lab panels on Mondays & Thursdays.    F/u daily.  CGretta ArabPharmD, BCPS Pager [phone]/16/2019 9:15 AM

## 2017-10-29 NOTE — Progress Notes (Signed)
PULMONARY / CRITICAL CARE MEDICINE   Name: [name] MRN: [id number] DOB: 04/24/1946    ADMISSION DATE:  10/14/2017 CONSULTATION DATE:  10/14/2017  REFERRING MD:  Marlou Starks  CHIEF COMPLAINT:  Abdominal pain  HISTORY OF PRESENT ILLNESS:  72 y/o female with multiple medical problems admitted on 1/1 with abdominal pain due to ischemic colon and ileum s/p right colectomy (1/1), small bowel resection (1/3), end ileostomy / fascial wound closure (1/5).  Had an acute stroke during this process recognized on 1/11. Developed new fevers 1/12, abd CT demonstrated fluid collection s/p IR drain 1/13.     SUBJECTIVE:  Tmax 99.1.  Trach yesterday.  Seen by plastics for forehead wound.  Pt pulled NGT out overnight.  RN overnight concerned about change in facial droop > no change in exam otherwise (? If this is because we can now see her face without ETT holder).     VITAL SIGNS: BP (!) 163/57 (BP Location: Left Arm)   Pulse (!) 109   Temp 98.9 F (37.2 C) (Oral)   Resp (!) 28   Ht 5\' 4"  (1.626 m)   Wt 173 lb 4.5 oz (78.6 kg)   SpO2 100%   BMI 29.74 kg/m   HEMODYNAMICS:    VENTILATOR SETTINGS: Vent Mode: PRVC FiO2 (%):  [30 %-100 %] 40 % Set Rate:  [16 bmp-28 bmp] 28 bmp Vt Set:  [460 mL] 460 mL PEEP:  [5 cmH20] 5 cmH20 Plateau Pressure:  [18 cmH20-20 cmH20] 18 cmH20  INTAKE / OUTPUT: I/O last 3 completed shifts: In: 1727.4 [I.V.:1677.4; IV Piggyback:50] Out: 3920 [Urine:2975; Emesis/NG output:475; Drains:70; Stool:400]  PHYSICAL EXAMINATION: General:  Obese female in NAD lying in bed HEENT: MM pink/moist, bloody secretions from mouth, NGT, #6 trach Neuro: Awake, alert, appears appropriate, recognized picture of her great grandson CV: s1s2 rrr, no m/r/g PULM: even/non-labored, lungs bilaterally clear  GI: obese / soft, wound vac midline, ileostomy with pink stoma, small amount yellow stool Extremities: warm/dry, trace generalized edema  Skin: wound on forehead / dressing intact,  tongue wound with small amt oozing  LABS:  BMET Recent Labs  Lab 10/27/17 0428 10/28/17 0500 10/29/17 0536  NA 142 143 142  K 3.7 3.6 3.6  CL 112* 116* 120*  CO2 23 20* 18*  BUN 75* 68* 62*  CREATININE 1.51* 1.24* 1.01*  GLUCOSE 146* 142* 132*    Electrolytes Recent Labs  Lab 10/26/17 0525 10/27/17 0428 10/28/17 0500 10/28/17 1715 10/29/17 0536  CALCIUM 7.8* 8.1* 8.3*  --  8.3*  MG 2.0 1.9  --  1.6* 1.9  PHOS 4.0 3.9  --   --  2.1*    CBC Recent Labs  Lab 10/27/17 0428 10/28/17 0500 10/29/17 0536  WBC 11.7* 8.9 10.6*  HGB 8.4* 8.1* 8.1*  HCT 26.2* 25.6* 25.3*  PLT 315 299 309    Coag's Recent Labs  Lab 10/28/17 0500  APTT 36  INR 1.17    Sepsis Markers Recent Labs  Lab 10/25/17 2312  LATICACIDVEN 1.5    ABG Recent Labs  Lab 10/24/17 1600 10/28/17 2028  PHART 7.363 7.285*  PCO2ART 49.9* 39.6  PO2ART 415* 503*    Liver Enzymes Recent Labs  Lab 10/23/17 0438 10/27/17 0428 10/29/17 0536  AST 46* 238* 131*  ALT 58* 145* 145*  ALKPHOS 172* 186* 148*  BILITOT 1.5* 0.6 0.6  ALBUMIN 1.5* 1.8* 1.7*    Cardiac Enzymes No results for input(s): TROPONINI, PROBNP in the last 168 hours.  Glucose  Recent Labs  Lab 10/28/17 1137 10/28/17 1610 10/28/17 1945 10/28/17 2332 10/29/17 0328 10/29/17 0707  GLUCAP 147* 117* 149* 125* 126* 130*    Imaging Dg Abd 1 View  Result Date: 10/29/2017 CLINICAL DATA:  72 year old female status post enteric tube placement. EXAM: ABDOMEN - 1 VIEW; PORTABLE CHEST - 1 VIEW COMPARISON:  Radiograph dated 10/28/2017 FINDINGS: Enteric tube with side port in the distal esophagus and tip in the region of the gastroesophageal junction. Recommend advancing the tube further into the stomach by approximately 7 cm. Tracheostomy tube above the carina. Right IJ central line with tip in the region of the cavoatrial junction. Shallow inspiration. No focal consolidation, pleural effusion, or pneumothorax. The cardiac  silhouette is within normal limits. The visualized upper abdomen is grossly unremarkable. IMPRESSION: Enteric tube with side port in the distal esophagus and tip at the gastroesophageal junction. Recommend advancing the tube further into the stomach. Electronically Signed   By: Anner Crete M.D.   On: 10/29/2017 04:58   Dg Abd 1 View  Result Date: 10/28/2017 CLINICAL DATA:  72 y/o  F; nasogastric tube adjustment. EXAM: ABDOMEN - 1 VIEW COMPARISON:  10/28/2017 abdomen radiograph FINDINGS: Enteric tube tip projects over gastric body, now well position. Right central venous catheter tip projects over lower SVC. Surgical drain projects over right lower quadrant. IMPRESSION: Enteric tube tip projects over gastric body, now well positioned and uncoiled. Electronically Signed   By: Kristine Garbe M.D.   On: 10/28/2017 23:42   Dg Abd 1 View  Result Date: 10/28/2017 CLINICAL DATA:  72 y/o  F; nasogastric tube placement. EXAM: ABDOMEN - 1 VIEW COMPARISON:  10/28/2017 abdomen radiograph FINDINGS: Further insertion of nasogastric tube with the tip still coiled back to the gastroesophageal junction. Stable bowel gas pattern. Drain in the right lower quadrant. IMPRESSION: Further insertion of nasogastric tube with the tip still coiled back to the gastroesophageal junction. Electronically Signed   By: Kristine Garbe M.D.   On: 10/28/2017 22:41   Dg Abd 1 View  Result Date: 10/28/2017 CLINICAL DATA:  Nasogastric tube adjustment. EXAM: ABDOMEN - 1 VIEW COMPARISON:  10/28/2017 at 1729 hours FINDINGS: The nasal/orogastric tube has been further inserted. However, the tip remains at the gastroesophageal junction. The side hole lies below this well within the stomach. IMPRESSION: 1. Nasogastric tube has been further inserted. It remains curled with its distal tip still present at the gastroesophageal junction. Electronically Signed   By: Lajean Manes M.D.   On: 10/28/2017 19:43   Dg Abd 1  View  Result Date: 10/28/2017 CLINICAL DATA:  Encounter for NG tube placement EXAM: ABDOMEN - 1 VIEW COMPARISON:  None. FINDINGS: Nasogastric tube tip is coiled at the gastroesophageal junction. Visualized bowel gas pattern within the upper abdomen is nonobstructive. Lung bases appear clear. IMPRESSION: Nasogastric tube tip coiled at the gastroesophageal junction. Recommend advancing. Electronically Signed   By: Franki Cabot M.D.   On: 10/28/2017 17:53   Dg Chest Port 1 View  Result Date: 10/29/2017 CLINICAL DATA:  73 year old female status post enteric tube placement. EXAM: ABDOMEN - 1 VIEW; PORTABLE CHEST - 1 VIEW COMPARISON:  Radiograph dated 10/28/2017 FINDINGS: Enteric tube with side port in the distal esophagus and tip in the region of the gastroesophageal junction. Recommend advancing the tube further into the stomach by approximately 7 cm. Tracheostomy tube above the carina. Right IJ central line with tip in the region of the cavoatrial junction. Shallow inspiration. No focal consolidation, pleural effusion, or pneumothorax.  The cardiac silhouette is within normal limits. The visualized upper abdomen is grossly unremarkable. IMPRESSION: Enteric tube with side port in the distal esophagus and tip at the gastroesophageal junction. Recommend advancing the tube further into the stomach. Electronically Signed   By: Anner Crete M.D.   On: 10/29/2017 04:58   Dg Chest Port 1 View  Result Date: 10/28/2017 CLINICAL DATA:  Post tracheostomy tube placement today EXAM: PORTABLE CHEST 1 VIEW COMPARISON:  Portable exam 1541 hours compared to 10/28/2017 FINDINGS: New tracheostomy tube with tip projecting over tracheal air column. RIGHT arm PICC line with tip projecting over SVC. Normal heart size, mediastinal contours, and pulmonary vascularity. Atherosclerotic calcification aorta. Lungs clear. No pleural effusion or pneumothorax. IMPRESSION: No acute abnormalities. Electronically Signed   By: Lavonia Dana  M.D.   On: 10/28/2017 16:03     STUDIES:  1/1 CT abdomen/pelvis: diffuse pneumatosis in cecum and ascending colon with distension, bibasilar airspace opacities, aortic calcification, non-obstructing left kidney stone Jan 11 CT head > NAICP January 11 MRI/MRA brain: Acute cerebral infarction bilateral left greater than right, left parietal and left temporal lobe.  Small acute infarct right frontal parietal white hemorrhage, occluded left common carotid. 1/12 CT Ab/pelvis> loculated fluid collection along R abdominal wall; cannot exclude cirrhosis, liver lobe enlargement  CULTURES: 1/1 Blood > neg 1/1 urine > neg 1/7 blood > neg 1/7 cath tip > neg 1/11 blood >  1/13 Abdomen fluid >  ANTIBIOTICS: Zosyn 12/31 > 1/9 Anidulafungin 1/4 >  1/9 Meropenem 1/11 >   SIGNIFICANT EVENTS: 1/01 ex lap, R colectomy 1/03 ex lap, partial omentectomy, resection of ischemic ileum (130cm)  1/05 Ex lap, abdominal wall closure 1/09 Extubation 1/11 Acute CVA, reintubated 1/13 Fever > CT abdomen/pelvis: abscess, Moving around well, BP labile.  IR perc drain. 1/14 Weaning on PSV 10/5, 30%, Tmax 101.1.  Pt in bigeminy (has been).  1/15 Trach   LINES/TUBES: 1/1 ETT > 1/9, 1/11 > 1/15 1/1 R IJ CVL > 1/8 1/7 PICC >  Trach 1/15 >>  DISCUSSION: 72 y/o female with multiple medical problems admitted with ischemic bowel.  Required bowel resection, exploratory laparotomy x3 closed on January 5.  Acute CVA recognized on January 11.  This is believed to be a watershed infarct related to severe sepsis on admission with ischemic bowel and bowel perforation.  Reintubated January 11, fever afterwards> CT abdomen 1/12 showed a likely R abdominal wall abscess.  ASSESSMENT / PLAN:  NEUROLOGIC A:   Acute bilateral CVA - felt to be due to hypotension / watershed in setting of septic shock on presentation; symptoms have waxed and waned with fever, BP changes; neuro signed off 1/13 Need for sedation for mechanical vent  synchrony P:   RASS goal: 0  Reduce fentanyl ceiling to 250 mcg Monitor neuro exam closely > no changes on am exam  Limit sedation as able  Neuro previously saw > felt she would recover with possible residual weakness  PULMONARY A: Acute respiratory failure with hypoxemia Tracheostomy Status  P:   PRVC 8 cc/kg  SBT with goal for ATC  Duoneb  VAP prevention measures  Lasix 40 mg IV x1 Trach care per protocol   CARDIOVASCULAR A:  Hx CAD Hypertension Bigeminy - 1/14 Septic shock - resolved P:  Tele monitoring  ASA  PRN hydralazine for SBP > 170  RENAL A:   AKI - improving, in setting of septic shock  NGMA - ? ileostomy output P:   Add D5w with  bicarbonate @50  ml/hr  Trend BMP / urinary output Replace electrolytes as indicated Avoid nephrotoxic agents, ensure adequate renal perfusion  GASTROINTESTINAL A:   Shock liver - resolved Ischemic bowel - resected, abd closed, VAC in place Severe protein calorie malnutrition Abdominal abscess P:   TPN per pharmacy > if continues to tolerate TF, consider d/c in next 24-48 hours CCS ok with TF  Post-op care, diet per CCS  IR following, s/p abscess drain 1/13  Pepcid for SUP  Repeat KUB now for NGT placement   HEMATOLOGIC A:   Mild anemia without bleeding P:  Trend CBC  Monitor for bleeding  Heparin SQ for DVT prophylaxis   INFECTIOUS A:   Ischemic colon/bowel with peritonitis s/p washout Fever - new 1/12, presumably due to fluid collection in abdomen, CXR clear, blood cultures negative, lines relatively new Forehead Wound  P:   ABX as above, continue meropenem  Await final cultures  Appreciate CCS, IR assistance  Heparin for DVT prophylaxis   ENDOCRINE A:   DM2 with hyperglycemia  P:   SSI     FAMILY  - Updates: Mark [phone] updated by phone 1/16.    - Inter-disciplinary family meet or Palliative Care meeting due by: Son willing to continue to support her aggressively with the hopes of  recovery.  He also is very focused on her quality of life and wants to consider each decision / step of her care based on quality.  If she has no chance for meaningful recovery, he would not likely want to continue support.  Full Code.    Noe Gens, NP-C [facility] Pgr: [phone] or if no answer [phone] 10/29/2017, 8:35 AM

## 2017-10-29 NOTE — Progress Notes (Signed)
       [name]

## 2017-10-29 NOTE — Progress Notes (Signed)
Referring Physician(s): Toth,P  Supervising Physician: Aletta Edouard  Patient Status:  [facility] - In-pt  Chief Complaint: right abdominal fluid collection     Subjective: Pt now has trach; no other acute changes   Allergies: Codeine and Iodinated diagnostic agents  Medications: Prior to Admission medications   Medication Sig Start Date End Date Taking? Authorizing Provider  ABILIFY 5 MG tablet Take 5 mg by mouth daily.  04/19/11  Yes [provider]  amLODipine (NORVASC) 10 MG tablet Take 10 mg by mouth daily.     Yes [provider]  ASPERCREME HEAT 10 % GEL Apply 1 application topically as needed (arthritis).  11/28/16  Yes [provider]  aspirin EC 81 MG tablet Take 81 mg by mouth every evening.   Yes [provider]  atorvastatin (LIPITOR) 20 MG tablet Take 20 mg by mouth daily.     Yes [provider]  Cyanocobalamin (VITAMIN B-12 PO) Take 1 tablet by mouth once a week.    Yes [provider]  dicyclomine (BENTYL) 20 MG tablet Take 10 mg by mouth 4 (four) times daily -  before meals and at bedtime.   Yes [provider]  diphenhydrAMINE (BENADRYL) 50 MG tablet Take 1 tablet (50 mg total) by mouth daily as needed for allergies. 02/24/17  Yes Collene Gobble, MD  esomeprazole (NEXIUM) 40 MG capsule Take 40 mg by mouth 2 (two) times daily.     Yes [provider]  furosemide (LASIX) 20 MG tablet Take 20 mg by mouth daily. 09/22/14  Yes [provider]  gabapentin (NEURONTIN) 300 MG capsule Take 300 mg by mouth 2 (two) times daily.    Yes [provider]  HYDROcodone-acetaminophen (NORCO/VICODIN) 5-325 MG tablet Take 1 tablet by mouth 4 (four) times daily as needed for severe pain.  01/31/17  Yes [provider]  ibuprofen (ADVIL,MOTRIN) 200 MG tablet Take 200-400 mg by mouth daily as needed for headache or moderate pain.   Yes [provider]  isosorbide mononitrate  (IMDUR) 60 MG 24 hr tablet TAKE 1 TABLET BY MOUTH EVERY DAY 08/18/17  Yes Almyra Deforest, PA  loperamide (IMODIUM) 1 MG/5ML solution Take 2 mg by mouth daily as needed for diarrhea or loose stools.   Yes [provider]  losartan (COZAAR) 100 MG tablet Take 100 mg by mouth daily.   Yes [provider]  methscopolamine (PAMINE FORTE) 5 MG tablet Take 5 mg by mouth at bedtime.     Yes [provider]  metoprolol succinate (TOPROL-XL) 100 MG 24 hr tablet Take 100 mg by mouth daily. 01/24/17  Yes [provider]  montelukast (SINGULAIR) 10 MG tablet Take 10 mg by mouth daily.     Yes [provider]  MYRBETRIQ 50 MG TB24 tablet Take 50 mg by mouth daily. 10/10/14  Yes [provider]  nitroGLYCERIN (NITROSTAT) 0.4 MG SL tablet Place 1 tablet (0.4 mg total) under the tongue every 5 (five) minutes as needed for chest pain. 11/08/15  Yes Martinique, Peter M, MD  Pancrelipase, Lip-Prot-Amyl, (ZENPEP) 25000-79000 units CPEP Take 2 capsules by mouth 3 (three) times daily before meals. And with snacks 01/13/17  Yes Ladene Artist, MD  rOPINIRole (REQUIP) 0.5 MG tablet Take 0.5 mg by mouth at bedtime as needed (restless legs).  11/28/16  Yes [provider]  tamsulosin (FLOMAX) 0.4 MG CAPS capsule Take 0.4 mg by mouth daily.   Yes [provider]  venlafaxine (  EFFEXOR-XR) 150 MG 24 hr capsule Take 150 mg by mouth daily.     Yes [provider]  VICTOZA 18 MG/3ML SOPN Inject 1.8 mg into the skin every evening.  10/06/15  Yes [provider]  zolpidem (AMBIEN) 10 MG tablet Take 10 mg by mouth at bedtime as needed for sleep. 01/11/17  Yes [provider]     Vital Signs: BP (!) 162/95   Pulse (!) 122   Temp 98.9 F (37.2 C) (Oral)   Resp 19   Ht 5\' 4"  (1.626 m)   Wt 173 lb 4.5 oz (78.6 kg)   SpO2 99%   BMI 29.74 kg/m   Physical Exam rt abd drain intact, output 50 cc bloody fluid, insertion site ok, not sig  tender  Imaging: Ct Abdomen Pelvis Wo Contrast  Result Date: 10/25/2017 CLINICAL DATA:  Abdominal pain. Fever. Abscess suspected. Exploratory laparotomy on 10/18/2017. Resection of ischemic small bowel. Prior right hemicolectomy 10/14/2017. EXAM: CT ABDOMEN AND PELVIS WITHOUT CONTRAST TECHNIQUE: Multidetector CT imaging of the abdomen and pelvis was performed following the standard protocol without IV contrast. COMPARISON:  Plain films 10/25/2017.  Most recent CT of 10/14/2017. FINDINGS: Lower chest: bibasilar atelectasis. Bibasilar bilateral breast implants. Normal heart size. Hepatobiliary: Caudate and lateral segment left liver lobe enlargement. Normal gallbladder, without biliary ductal dilatation. Pancreas: Mild pancreatic atrophy. Spleen: Normal in size, without focal abnormality. Adrenals/Urinary Tract: Normal adrenal glands. Lower pole left renal collecting system calculi. No hydronephrosis. Foley catheter within the urinary bladder, not entirely decompressed. Stomach/Bowel: Nasogastric tube terminating at the gastric body. Long Hartmann's pouch. Underdistended rectosigmoid region. Right lower quadrant ileostomy. Otherwise normal small bowel. Vascular/Lymphatic: Aortic and branch vessel atherosclerosis. No abdominopelvic adenopathy. Reproductive: Hysterectomy.  No adnexal mass. Other: Loculated ascites is identified within the right lower abdomen, including at 8.0 x 6.9 cm on image 46/series 2. This is positioned immediately lateral to the ileostomy. Trace right-sided pelvic fluid is also identified on image 66/series 2. Nonspecific presacral edema. Minimal interloop mesenteric fluid including on image 59/series 2. Musculoskeletal: No acute osseous abnormality. IMPRESSION: 1. Status post right-sided ileostomy and long Hartmann's pouch. Loculated ascites in the right-sided abdomen, adjacent to the ostomy site, suspicious for infected ascites and possible developing abscess. Sensitivity decreased on this  noncontrast exam. 2. Other areas of smaller volume pelvic and mesenteric fluid are nonspecific. 3. Coronary artery atherosclerosis. Aortic Atherosclerosis (ICD10-I70.0). 4. Caudate and lateral segment left liver lobe enlargement. Cannot exclude mild cirrhosis. 5. Left nephrolithiasis. Electronically Signed   By: Abigail Miyamoto M.D.   On: 10/25/2017 17:13   Dg Abd 1 View  Result Date: 10/29/2017 CLINICAL DATA:  Nasogastric tube placement EXAM: ABDOMEN - 1 VIEW COMPARISON:  Study obtained earlier in the day FINDINGS: Nasogastric tube tip and side port are in the stomach. There is moderate stool in the colon. The visualized bowel gas pattern is unremarkable. No obstruction or free air evident. No edema or consolidation is noted in the visualized lung regions. There is aortic atherosclerosis. Tracheostomy tip is 4.1 cm above the carina. IMPRESSION: Nasogastric tube tip and side port in stomach. No evident bowel obstruction or free air. There is aortic atherosclerosis. Aortic Atherosclerosis (ICD10-I70.0). Electronically Signed   By: Lowella Grip III M.D.   On: 10/29/2017 10:04   Dg Abd 1 View  Result Date: 10/29/2017 CLINICAL DATA:  72 year old female status post enteric tube placement. EXAM: ABDOMEN - 1 VIEW; PORTABLE CHEST - 1 VIEW COMPARISON:  Radiograph dated 10/28/2017 FINDINGS: Enteric tube  with side port in the distal esophagus and tip in the region of the gastroesophageal junction. Recommend advancing the tube further into the stomach by approximately 7 cm. Tracheostomy tube above the carina. Right IJ central line with tip in the region of the cavoatrial junction. Shallow inspiration. No focal consolidation, pleural effusion, or pneumothorax. The cardiac silhouette is within normal limits. The visualized upper abdomen is grossly unremarkable. IMPRESSION: Enteric tube with side port in the distal esophagus and tip at the gastroesophageal junction. Recommend advancing the tube further into the stomach.  Electronically Signed   By: Anner Crete M.D.   On: 10/29/2017 04:58   Dg Abd 1 View  Result Date: 10/28/2017 CLINICAL DATA:  72 y/o  F; nasogastric tube adjustment. EXAM: ABDOMEN - 1 VIEW COMPARISON:  10/28/2017 abdomen radiograph FINDINGS: Enteric tube tip projects over gastric body, now well position. Right central venous catheter tip projects over lower SVC. Surgical drain projects over right lower quadrant. IMPRESSION: Enteric tube tip projects over gastric body, now well positioned and uncoiled. Electronically Signed   By: Kristine Garbe M.D.   On: 10/28/2017 23:42   Dg Abd 1 View  Result Date: 10/28/2017 CLINICAL DATA:  72 y/o  F; nasogastric tube placement. EXAM: ABDOMEN - 1 VIEW COMPARISON:  10/28/2017 abdomen radiograph FINDINGS: Further insertion of nasogastric tube with the tip still coiled back to the gastroesophageal junction. Stable bowel gas pattern. Drain in the right lower quadrant. IMPRESSION: Further insertion of nasogastric tube with the tip still coiled back to the gastroesophageal junction. Electronically Signed   By: Kristine Garbe M.D.   On: 10/28/2017 22:41   Dg Abd 1 View  Result Date: 10/28/2017 CLINICAL DATA:  Nasogastric tube adjustment. EXAM: ABDOMEN - 1 VIEW COMPARISON:  10/28/2017 at 1729 hours FINDINGS: The nasal/orogastric tube has been further inserted. However, the tip remains at the gastroesophageal junction. The side hole lies below this well within the stomach. IMPRESSION: 1. Nasogastric tube has been further inserted. It remains curled with its distal tip still present at the gastroesophageal junction. Electronically Signed   By: Lajean Manes M.D.   On: 10/28/2017 19:43   Dg Abd 1 View  Result Date: 10/28/2017 CLINICAL DATA:  Encounter for NG tube placement EXAM: ABDOMEN - 1 VIEW COMPARISON:  None. FINDINGS: Nasogastric tube tip is coiled at the gastroesophageal junction. Visualized bowel gas pattern within the upper abdomen is  nonobstructive. Lung bases appear clear. IMPRESSION: Nasogastric tube tip coiled at the gastroesophageal junction. Recommend advancing. Electronically Signed   By: Franki Cabot M.D.   On: 10/28/2017 17:53   Dg Chest Port 1 View  Result Date: 10/29/2017 CLINICAL DATA:  72 year old female status post enteric tube placement. EXAM: ABDOMEN - 1 VIEW; PORTABLE CHEST - 1 VIEW COMPARISON:  Radiograph dated 10/28/2017 FINDINGS: Enteric tube with side port in the distal esophagus and tip in the region of the gastroesophageal junction. Recommend advancing the tube further into the stomach by approximately 7 cm. Tracheostomy tube above the carina. Right IJ central line with tip in the region of the cavoatrial junction. Shallow inspiration. No focal consolidation, pleural effusion, or pneumothorax. The cardiac silhouette is within normal limits. The visualized upper abdomen is grossly unremarkable. IMPRESSION: Enteric tube with side port in the distal esophagus and tip at the gastroesophageal junction. Recommend advancing the tube further into the stomach. Electronically Signed   By: Anner Crete M.D.   On: 10/29/2017 04:58   Dg Chest Port 1 View  Result Date: 10/28/2017 CLINICAL  DATA:  Post tracheostomy tube placement today EXAM: PORTABLE CHEST 1 VIEW COMPARISON:  Portable exam 1541 hours compared to 10/28/2017 FINDINGS: New tracheostomy tube with tip projecting over tracheal air column. RIGHT arm PICC line with tip projecting over SVC. Normal heart size, mediastinal contours, and pulmonary vascularity. Atherosclerotic calcification aorta. Lungs clear. No pleural effusion or pneumothorax. IMPRESSION: No acute abnormalities. Electronically Signed   By: Lavonia Dana M.D.   On: 10/28/2017 16:03   Dg Chest Port 1 View  Result Date: 10/28/2017 CLINICAL DATA:  Respiratory failure. EXAM: PORTABLE CHEST 1 VIEW COMPARISON:  Radiograph of October 27, 2017. FINDINGS: Stable cardiomediastinal silhouette. Atherosclerosis of  thoracic aorta is noted. Endotracheal and nasogastric tubes are unchanged in position. No pneumothorax is noted. Right-sided PICC line is unchanged in position. Minimal left basilar subsegmental atelectasis is noted which is improved compared to prior exam. Right lung is clear. Bony thorax is unremarkable. IMPRESSION: Stable support apparatus. Aortic atherosclerosis. Minimal left basilar subsegmental atelectasis. Electronically Signed   By: Marijo Conception, M.D.   On: 10/28/2017 07:27   Dg Chest Port 1 View  Result Date: 10/27/2017 CLINICAL DATA:  72 year old female with history of ischemic bowel status post multiple recent abdominal surgeries. Right lower quadrant abscess drain. Respiratory failure, intubated. EXAM: PORTABLE CHEST 1 VIEW COMPARISON:  Portable chest 10/26/2017, CT Abdomen and Pelvis 10/25/2017, and earlier. FINDINGS: Portable AP semi upright view at 0514 hours. More kyphotic positioning today. Stable endotracheal tube tip at the level the clavicles. Enteric tube courses to the left abdomen, tip not included. Stable right subclavian central line. New line lung volumes and mediastinal contours not significantly changed. Increased appearance of bilateral patchy lung base opacity. No pneumothorax. No definite pleural effusion. Increased pulmonary vascularity without overt edema. The Visible bowel gas pattern within normal limits. IMPRESSION: 1.  Stable lines and tubes. 2. Patchy bibasilar pulmonary opacity appears increased since 10/25/2017 and could be atelectasis or infection. Electronically Signed   By: Genevie Ann M.D.   On: 10/27/2017 07:07   Dg Chest Port 1 View  Result Date: 10/26/2017 CLINICAL DATA:  Acute respiratory failure with hypoxemia. EXAM: PORTABLE CHEST 1 VIEW COMPARISON:  One-view chest x-ray 10/25/2017. FINDINGS: Endotracheal tube terminates 4 cm above the carina, stable. Right-sided PICC line is stable. The side port of the NG tube is in the stomach. Aeration of both lungs is  improved. No focal airspace disease is present. Lung volumes remain low. Atherosclerotic changes are again seen at the aortic arch. IMPRESSION: 1. Improved aeration of both lungs with persistent low lung volumes. 2. No focal airspace disease. 3. Support apparatus is stable. 4.  Aortic Atherosclerosis (ICD10-I70.0). Electronically Signed   By: San Morelle M.D.   On: 10/26/2017 06:55   Ct Image Guided Drainage By Percutaneous Catheter  Result Date: 10/26/2017 INDICATION: Right lower quadrant abscess EXAM: CT GUIDED DRAINAGE OF  ABSCESS MEDICATIONS: The patient is currently admitted to the hospital and receiving intravenous antibiotics. The antibiotics were administered within an appropriate time frame prior to the initiation of the procedure. ANESTHESIA/SEDATION: Patient was intubated COMPLICATIONS: None immediate. TECHNIQUE: Informed written consent was obtained from the patient after a thorough discussion of the procedural risks, benefits and alternatives. All questions were addressed. Maximal Sterile Barrier Technique was utilized including caps, mask, sterile gowns, sterile gloves, sterile drape, hand hygiene and skin antiseptic. A timeout was performed prior to the initiation of the procedure. PROCEDURE: The right lower quadrant was prepped with ChloraPrep in a sterile fashion, and a  sterile drape was applied covering the operative field. A sterile gown and sterile gloves were used for the procedure. Local anesthesia was provided with 1% Lidocaine. Under CT guidance, an 18 gauge needle was inserted into the right lower quadrant abscess and removed over an Amplatz wire. Twelve [place] dilator followed by a 12 [place] drain were inserted. It was looped and string fixed then sewn to the skin. Dark fluid was aspirated. A sample was obtained and sent for culture. FINDINGS: Imaging documents placement of a 54 French drain into a right lower quadrant abscess. IMPRESSION: Successful right lower quadrant 12  French abscess drain placement. Electronically Signed   By: Marybelle Killings M.D.   On: 10/26/2017 12:43    Labs:  CBC: Recent Labs    10/26/17 0525 10/27/17 0428 10/28/17 0500 10/29/17 0536  WBC 14.7* 11.7* 8.9 10.6*  HGB 9.2* 8.4* 8.1* 8.1*  HCT 27.6* 26.2* 25.6* 25.3*  PLT 317 315 299 309    COAGS: Recent Labs    10/14/17 0329 10/28/17 0500  INR 1.38 1.17  APTT  --  36    BMP: Recent Labs    10/26/17 0525 10/27/17 0428 10/28/17 0500 10/29/17 0536  NA 142 142 143 142  K 3.5 3.7 3.6 3.6  CL 107 112* 116* 120*  CO2 24 23 20* 18*  GLUCOSE 193* 146* 142* 132*  BUN 87* 75* 68* 62*  CALCIUM 7.8* 8.1* 8.3* 8.3*  CREATININE 1.65* 1.51* 1.24* 1.01*  GFRNONAA 30* 34* 43* 55*  GFRAA 35* 39* 50* >60    LIVER FUNCTION TESTS: Recent Labs    10/22/17 0500 10/23/17 0438 10/27/17 0428 10/29/17 0536  BILITOT 1.5* 1.5* 0.6 0.6  AST 55* 46* 238* 131*  ALT 64* 58* 145* 145*  ALKPHOS 160* 172* 186* 148*  PROT 4.8* 5.7* 5.8* 5.5*  ALBUMIN 1.3* 1.5* 1.8* 1.7*    Assessment and Plan: Pt s/p drainage of post op right abd fluid collection 1/13; afebrile; WBC 10.6(8.9), hgb 8.1(8.1), creat 1.01; drain fluid cx neg to date; cont current tx; check f/u CT once output minimal or if WBC cont to climb     Electronically Signed: D. Rowe Robert, PA-C 10/29/2017, 2:25 PM   I spent a total of 15 minutes at the the patient's bedside AND on the patient's hospital floor or unit, greater than 50% of which was counseling/coordinating care for abdominal fluid collection drain    Patient ID: [name], female   DOB: 1946/05/13, 72 y.o.   MRN: [id number]

## 2017-10-30 ENCOUNTER — Inpatient Hospital Stay (HOSPITAL_COMMUNITY): Payer: Medicare Other

## 2017-10-30 LAB — PHOSPHORUS: PHOSPHORUS: 2.5 mg/dL (ref 2.5–4.6)

## 2017-10-30 LAB — GLUCOSE, CAPILLARY
GLUCOSE-CAPILLARY: 117 mg/dL — AB (ref 65–99)
GLUCOSE-CAPILLARY: 163 mg/dL — AB (ref 65–99)
GLUCOSE-CAPILLARY: 146 mg/dL — AB (ref 65–99)
Glucose-Capillary: 158 mg/dL — ABNORMAL HIGH (ref 65–99)
Glucose-Capillary: 138 mg/dL — ABNORMAL HIGH (ref 65–99)
Glucose-Capillary: 116 mg/dL — ABNORMAL HIGH (ref 65–99)
Glucose-Capillary: 130 mg/dL — ABNORMAL HIGH (ref 65–99)

## 2017-10-30 LAB — COMPREHENSIVE METABOLIC PANEL
ALT: 124 U/L — ABNORMAL HIGH (ref 14–54)
ANION GAP: 6 (ref 5–15)
AST: 96 U/L — ABNORMAL HIGH (ref 15–41)
Albumin: 1.8 g/dL — ABNORMAL LOW (ref 3.5–5.0)
Alkaline Phosphatase: 163 U/L — ABNORMAL HIGH (ref 38–126)
BILIRUBIN TOTAL: 0.5 mg/dL (ref 0.3–1.2)
BUN: 52 mg/dL — ABNORMAL HIGH (ref 6–20)
CO2: 23 mmol/L (ref 22–32)
Calcium: 8.4 mg/dL — ABNORMAL LOW (ref 8.9–10.3)
Chloride: 115 mmol/L — ABNORMAL HIGH (ref 101–111)
Creatinine, Ser: 0.95 mg/dL (ref 0.44–1.00)
GFR calc Af Amer: >60 mL/min (ref >60)
GFR, EST NON AFRICAN AMERICAN: 59 mL/min — AB (ref >60)
Glucose, Bld: 149 mg/dL — ABNORMAL HIGH (ref 65–99)
POTASSIUM: 3.1 mmol/L — AB (ref 3.5–5.1)
Sodium: 144 mmol/L (ref 135–145)
TOTAL PROTEIN: 5.9 g/dL — AB (ref 6.5–8.1)

## 2017-10-30 LAB — CBC
HCT: 25.3 % — ABNORMAL LOW (ref 36.0–46.0)
Hemoglobin: 8.3 g/dL — ABNORMAL LOW (ref 12.0–15.0)
MCH: 28.6 pg (ref 26.0–34.0)
MCHC: 32.8 g/dL (ref 30.0–36.0)
MCV: 87.2 fL (ref 78.0–100.0)
PLATELETS: 316 10*3/uL (ref 150–400)
RBC: 2.9 MIL/uL — ABNORMAL LOW (ref 3.87–5.11)
RDW: 17.7 % — AB (ref 11.5–15.5)
WBC: 8.9 10*3/uL (ref 4.0–10.5)

## 2017-10-30 LAB — MAGNESIUM: Magnesium: 1.6 mg/dL — ABNORMAL LOW (ref 1.7–2.4)

## 2017-10-30 MED ORDER — METOPROLOL TARTRATE 12.5 MG HALF TABLET
12.5000 mg | ORAL_TABLET | Freq: Two times a day (BID) | ORAL | Status: DC
  Administered 2017-10-30 – 2017-10-31 (×3): 12.5 mg
  Filled 2017-10-30 (×3): qty 1

## 2017-10-30 MED ORDER — MAGNESIUM SULFATE 2 GM/50ML IV SOLN
2.0000 g | Freq: Once | INTRAVENOUS | Status: AC
  Administered 2017-10-30: 2 g via INTRAVENOUS
  Filled 2017-10-30: qty 50

## 2017-10-30 MED ORDER — METOPROLOL TARTRATE 12.5 MG HALF TABLET: 12.5000 mg | ORAL_TABLET | Freq: Two times a day (BID) | ORAL | Status: DC

## 2017-10-30 MED ORDER — HEPARIN SODIUM (PORCINE) 5000 UNIT/ML IJ SOLN
5000.0000 [IU] | Freq: Three times a day (TID) | INTRAMUSCULAR | Status: DC
  Administered 2017-10-30 – 2017-11-06 (×21): 5000 [IU] via SUBCUTANEOUS
  Filled 2017-10-30 (×21): qty 1

## 2017-10-30 MED ORDER — POTASSIUM CHLORIDE 10 MEQ/100ML IV SOLN
10.0000 meq | INTRAVENOUS | Status: AC
  Administered 2017-10-30 (×4): 10 meq via INTRAVENOUS
  Filled 2017-10-30 (×4): qty 100

## 2017-10-30 MED ORDER — OSMOLITE 1.2 CAL PO LIQD
1000.0000 mL | ORAL | Status: DC
  Administered 2017-10-30 – 2017-11-05 (×5): 1000 mL
  Filled 2017-10-30 (×3): qty 1000

## 2017-10-30 NOTE — Progress Notes (Signed)
Nutrition Follow-up  DOCUMENTATION CODES:   Not applicable  INTERVENTION:  - Will change TF regimen: Osmolite 1.2 @ 40 mL/hr to advance by 10 mL every 8 hours to reach goal rate of Osmolite 1.2 @ 60 mL/hr. At goal rate, this regimen will provide 1728 kcal (97% minimum estimated kcal need), 80 grams of protein, and 1181 mL free water.  - Although pt with hypokalemia and hypomagnesemia, do not feel that this is refeeding-related as pt had been on TPN, but, rather, because of no lytes in TPN.   NUTRITION DIAGNOSIS:   Inadequate oral intake related to inability to eat as evidenced by NPO status. -ongoing  GOAL:   Patient will meet greater than or equal to 90% of their needs -met with nutrition support  MONITOR:   TF tolerance, Weight trends, Labs, Skin  ASSESSMENT:   Pt with PMH of GERD, DM, HTN, PVD, HLD, HTN presents with abdominal pain found necrosis of proximal colon. Pt is s/p emergent ex lap with extended right colectomy and application of abdominal vacuum dressing on 10/14/17. Pt required intubation r/t post op respiratory failure.   SIGNIFICANT EVENTS: 1/1- exploratory laparotomy, right colectomy with wound vac placement 1/3- small bowel ischemia, resected ischemic ileum, wound vac replaced, TPN inititated 1/5- re-exploration of abdomen with end proximal ileostomy and closure of hernia 1/9- extubated and OGT removed 1/11- ILE added 1/12 - trickle feeds started, Vital HP @ 20 ml/hr per MD 1/13- abdominal abscess drained 1/15- trach  1/16- able to re-start TF    1/17 Pt on trach collar at time of RD visit. NGT in place and pt is receiving Vital AF 1.2 @ 40 mL/hr via NGT which is providing 1152 kcal, 72 grams of protein, and 778 mL free water. Pt receiving Clinimix 5/15 @ 40 mL/hr but TPN to be stopped after current bag runs out. Dr. Darrel Hoover note from this AM states okay to advance to full TF. TF order outlined above. Re-estimated nutrition needs with anticipation of pt  being liberalized from vent/on trach collar for more prolonged periods in the coming days. Weight fluctuations since previous assessment. Used current weight (81.1 kg) in re-estimating needs.   Medications reviewed; Labs reviewed; CBGs: 158 and 138 mg/dL today, K: 3.1 mmol/L, Cl: 115 mmol/L, BUN: 52 mg/dL, Ca: 8.4 mg/dL, Mg: 1.6 mg/dL, Alk Phos elevated, LFTs elevated.      1/16  - Pt remains intubated with NGT placed in R nare yesterday. - RN reports to RD that tube has needed to be reinserted and then readvanced several times d/t pt pulling at tube. - Current weight consistent with admission weight; many weight fluctuations since admission.  - Consult received to re-start TF and if pt tolerates, may be able to d/c TPN within 24-48 hours.  - Currently receiving Clinimix 5/15 @ 83 mL/hr with 20% ILE @ 20 mL/hr x12 hours.  - This regimen is providing 1894 kcal and 100 grams of protein. Will order TF as outlined above and recommendations for TPN above. RN reports that pt had been tolerating Vital High Protein @ 40 mL/hr earlier in the day.   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  Patient is currently intubated on ventilator support via trach MV: 13.4 L/min Temp (24hrs), Avg:98.5 F (36.9 C), Min:97.6 F (36.4 C), Max:99.1 F (37.3 C) BP: 151/49 and MAP: 87  Medications; 2 g IV Mg sulfate x1  run today, 20 mEq IV KPhos x1 run today, 10 mEq IV KCl x4 runs yesterday. Lab; Phos: 2.1 mg/dL  IVF: D5-150 mEq sodium bicarb @ 50 mL/hr (204 kcal). Drip: Fentanyl @ 100 mcg/hr.      1/13 - Trickle feeds started 1/12 but stopped per surgery d/t need for drain placement.  - Vital HP @ 20 ml/hr was previously being provided. - TPN continues:electrolyte-free Clinimix5/15at 83 ml/hrand 20% ILE @ 20 ml/hr. - Pt awaiting trach placement sometime this week per chart  review.  - Weight is now -19 lb since 1/11.  - No longer meets criteria for obesity.  Patient is currently intubated on ventilator support MV:13L/min Temp (24hrs), Avg:100.2 F (37.9 C), Min:99 F (37.2 C), Max:101.9 F (38.8 C)      Diet Order:  Diet NPO time specified .TPN (CLINIMIX-E) Adult  EDUCATION NEEDS:   No education needs have been identified at this time  Skin:  Skin Assessment: Skin Integrity Issues: Skin Integrity Issues:: Stage II, Unstageable Stage II: back and L upper face; R unspecified area Unstageable: unknown location Incisions: abdominal   Last BM:  1/17  Height:   Ht Readings from Last 1 Encounters:  10/21/17 _0  (1.626 m)    Weight:   Wt Readings from Last 1 Encounters:  10/30/17 178 lb 12.7 oz (81.1 kg)    Ideal Body Weight:  54.5 kg  BMI:  Body mass index is 30.69 kg/m.  Estimated Nutritional Needs:   Kcal:  1785-2025 (22-25 kcal/kg)  Protein:  80-97 grams (1-1.2 grams/kg)  Fluid:  1.8L/day      Jarome Matin, MS, RD, LDN, CNSC Inpatient Clinical Dietitian Pager # [phone] After hours/weekend pager # [phone]

## 2017-10-30 NOTE — Progress Notes (Signed)
[place] for TPN  Indication: extensive large and small bowel resection  Patient Measurements: Height: '5\' 4"'  (162.6 cm) Weight: 178 lb 12.7 oz (81.1 kg) IBW/kg (Calculated) : 54.7 TPN AdjBW (KG): 60.8 Body mass index is 30.69 kg/m.  Insulin Requirements: Hx of DM. 13 units SSI/24 hrs,  Reg insulin 30 units in TPN.   Current Nutrition:  TPN at goal Tube feeds started on 1/15 s/p tracheostomy.  Vital HP at 40 ml/hr provides ~960 kcal and 84 g protein.  IVF: NaBicarb 150 mEq in D5w at 50 ml/hr.  Central access: CVC from 1/1-1/7, PICC triple lumen 1/7 TPN start date: 1/3  ASSESSMENT                                                                                                          HPI: 45 yoF to ED 1/1 with abd pain, pneumatosis R colon.   Ischemic colon/bowel with peritonitis s/p washout.  Hx of CAD, DM, Diverticulosis, HPL, HTN, chronic pancreatitis noted CVA  Significant events:  1/1 OR exp lap. R colectomy, wound vac 1/3 return to OR- expl lap > sm bowel ischemia, resected ischemic ileum > VAC replaced. Begin TPN  1/5 OR Re-exploration of abdomen with end proximal ileostomy and closure of abdominal wall hernia 1/7 CVC removed, cath tip and blood culture for persistent fever. PICC placed for TPN. 1/11 acute stroke, intubated 1/13 CT-guided drainage of RLQ fluid collection 1/15 Tracheostomy 1/17 TPN at 40/hr, reduce to 50m/hr, discontinue  Today:   Glucose - DM on Victoza PTA, currently on SSI and insulin in TPN.  CBGs now at goal < 150; Range 117-149  Electrolytes - K & Mag low this am - replete. Na, CorrCa WNL.  Phos wnl, low end of goal; currently on electrolyte-free TPN.  Renal - SCr improved to 0.95; BUN elevated but improved.  Volume overloaded, .  UOP decreased to -17 mL/24 hrs, increased stool/drain/NG output.  LFTs - AST/ALT and Alk Phos elevated but improved.  Bilirubin now improved to WNL.  TGs - were  349 on 1/1, Propofol d/c 1/2, Trig 366 (1/4), 168 (1/7), 198 (1/12), 171 (1/14)  Prealbumin - < 5  remains low d/t critical illness/inflammation (1/7), 17.3 (1/14)  NUTRITIONAL GOALS                                                                                             RD recs: 10/17/17 Kcal [address], protein 109-119 gm  Hold 20% lipid emulsion for first 7 days for ICU patients per ASPEN guidelines (Start date 10/24/17) Clinimix 5/15 at a goal rate of 83 ml/hr to provide:1414 kcal and 100  gm protein.  111% kcal and 92 % protein.   PLAN                                                                                                                   Now: Magnesium 2g IV x1, K runs x4  Today:  Decrease TPN to 20 ml/hr, discontinue at 1800    Transitioned from MVI/TE in TPN to per tube.  BMET, Mag level in am  Minda Ditto PharmD Pager [phone] 10/30/2017, 10:55 AM

## 2017-10-30 NOTE — Progress Notes (Signed)
Date: October 30, 2017 Velva Harman, BSN, [place], [place] Chart and notes review for patient progress and needs.  Remains on full vent support. Weaning trials. Will follow for case management and discharge needs. No cm or discharge needs present at time of this review. Next review date: [id number]

## 2017-10-30 NOTE — Progress Notes (Signed)
PULMONARY / CRITICAL CARE MEDICINE   Name: [name] MRN: [id number] DOB: September 05, 1946    ADMISSION DATE:  10/14/2017 CONSULTATION DATE:  10/14/2017  REFERRING MD:  Marlou Starks  CHIEF COMPLAINT:  Abdominal pain  HISTORY OF PRESENT ILLNESS:  72 y/o female with multiple medical problems admitted on 1/1 with abdominal pain due to ischemic colon and ileum s/p right colectomy (1/1), small bowel resection (1/3), end ileostomy / fascial wound closure (1/5).  Had an acute stroke during this process recognized on 1/11. Developed new fevers 1/12, abd CT demonstrated fluid collection s/p IR drain 1/13.     SUBJECTIVE:  RN reports pt weaned ~ 6 hours on ATC yesterday.  Patient asking for her son.     VITAL SIGNS: BP (!) 167/83   Pulse (!) 124   Temp 98.4 F (36.9 C) (Axillary)   Resp 11   Ht 5\' 4"  (1.626 m)   Wt 178 lb 12.7 oz (81.1 kg)   SpO2 100%   BMI 30.69 kg/m   HEMODYNAMICS:    VENTILATOR SETTINGS: Vent Mode: PRVC FiO2 (%):  [28 %-35 %] 30 % Set Rate:  [15 bmp] 15 bmp Vt Set:  [460 mL] 460 mL PEEP:  [5 cmH20] 5 cmH20 Plateau Pressure:  [20 cmH20] 20 cmH20  INTAKE / OUTPUT: I/O last 3 completed shifts: In: 5317.5 [I.V.:3918.9; NG/GT:994; IV Piggyback:404.6] Out: 3395 [Urine:2300; Emesis/NG output:600; Drains:45; Stool:450]  PHYSICAL EXAMINATION: General:ill appearing adult female in NAD, sitting up in bed HEENT: MM pink/moist, trach midline with bloody secretions, tongue with scab on tip, bloody oral secretions  PSY: calm Neuro: awake, alert, follows commands  CV: s1s2 rrr, no m/r/g PULM: even/non-labored, lungs bilaterally coarse, strong cough but does not completely clear secretions GI: abd soft, midline wound VAC, RLQ JP drain with serosanguinous drainage Extremities: warm/dry, no edema  Skin: no rashes or lesions  LABS:  BMET Recent Labs  Lab 10/28/17 0500 10/29/17 0536 10/30/17 0500  NA 143 142 144  K 3.6 3.6 3.1*  CL 116* 120* 115*  CO2 20* 18* 23  BUN 68*  62* 52*  CREATININE 1.24* 1.01* 0.95  GLUCOSE 142* 132* 149*    Electrolytes Recent Labs  Lab 10/27/17 0428 10/28/17 0500  10/29/17 0536 10/29/17 1814 10/30/17 0500  CALCIUM 8.1* 8.3*  --  8.3*  --  8.4*  MG 1.9  --    < > 1.9 1.7 1.6*  PHOS 3.9  --   --  2.1*  --  2.5   < > = values in this interval not displayed.    CBC Recent Labs  Lab 10/28/17 0500 10/29/17 0536 10/30/17 0500  WBC 8.9 10.6* 8.9  HGB 8.1* 8.1* 8.3*  HCT 25.6* 25.3* 25.3*  PLT 299 309 316    Coag's Recent Labs  Lab 10/28/17 0500  APTT 36  INR 1.17    Sepsis Markers Recent Labs  Lab 10/25/17 2312  LATICACIDVEN 1.5    ABG Recent Labs  Lab 10/24/17 1600 10/28/17 2028  PHART 7.363 7.285*  PCO2ART 49.9* 39.6  PO2ART 415* 503*    Liver Enzymes Recent Labs  Lab 10/27/17 0428 10/29/17 0536 10/30/17 0500  AST 238* 131* 96*  ALT 145* 145* 124*  ALKPHOS 186* 148* 163*  BILITOT 0.6 0.6 0.5  ALBUMIN 1.8* 1.7* 1.8*    Cardiac Enzymes No results for input(s): TROPONINI, PROBNP in the last 168 hours.  Glucose Recent Labs  Lab 10/29/17 0707 10/29/17 1158 10/29/17 1940 10/29/17 2346 10/30/17 0347 10/30/17  0807  GLUCAP 130* 148* 137* 132* 158* 138*    Imaging Dg Abd 1 View  Result Date: 10/29/2017 CLINICAL DATA:  Nasogastric tube placement EXAM: ABDOMEN - 1 VIEW COMPARISON:  Study obtained earlier in the day FINDINGS: Nasogastric tube tip and side port are in the stomach. There is moderate stool in the colon. The visualized bowel gas pattern is unremarkable. No obstruction or free air evident. No edema or consolidation is noted in the visualized lung regions. There is aortic atherosclerosis. Tracheostomy tip is 4.1 cm above the carina. IMPRESSION: Nasogastric tube tip and side port in stomach. No evident bowel obstruction or free air. There is aortic atherosclerosis. Aortic Atherosclerosis (ICD10-I70.0). Electronically Signed   By: Lowella Grip III M.D.   On: 10/29/2017 10:04    Dg Chest Port 1 View  Result Date: 10/30/2017 CLINICAL DATA:  Respiratory failure EXAM: PORTABLE CHEST 1 VIEW COMPARISON:  October 29, 2017 FINDINGS: Tracheostomy catheter tip is 4.0 cm above the carina. Central catheter tip is in the superior vena cava near the cavoatrial junction. Nasogastric tube tip and side port below diaphragm. No pneumothorax. There is bibasilar subsegmental atelectasis. There is no edema or consolidation. Heart is upper normal in size with pulmonary vascularity within normal limits. There is aortic atherosclerosis. No adenopathy. No bone lesions. IMPRESSION: Tube and catheter positions as described without pneumothorax. Mild bibasilar atelectasis. Stable cardiac silhouette. There is aortic atherosclerosis. Aortic Atherosclerosis (ICD10-I70.0). Electronically Signed   By: Lowella Grip III M.D.   On: 10/30/2017 09:10     STUDIES:  1/1 CT abdomen/pelvis: diffuse pneumatosis in cecum and ascending colon with distension, bibasilar airspace opacities, aortic calcification, non-obstructing left kidney stone Jan 11 CT head > NAICP January 11 MRI/MRA brain: Acute cerebral infarction bilateral left greater than right, left parietal and left temporal lobe.  Small acute infarct right frontal parietal white hemorrhage, occluded left common carotid. 1/12 CT Ab/pelvis> loculated fluid collection along R abdominal wall; cannot exclude cirrhosis, liver lobe enlargement  CULTURES: 1/1 Blood > neg 1/1 urine > neg 1/7 blood > neg 1/7 cath tip > neg 1/11 blood > neg 1/13 Abdomen fluid >  ANTIBIOTICS: Zosyn 12/31 > 1/9 Anidulafungin 1/4 >  1/9 Meropenem 1/11 >   SIGNIFICANT EVENTS: 1/01 ex lap, R colectomy 1/03 ex lap, partial omentectomy, resection of ischemic ileum (130cm)  1/05 Ex lap, abdominal wall closure 1/09 Extubation 1/11 Acute CVA, reintubated 1/13 Fever > CT abdomen/pelvis: abscess, Moving around well, BP labile.  IR perc drain. 1/14 Weaning on PSV 10/5, 30%,  Tmax 101.1.  Pt in bigeminy (has been).  1/15 Trach  1/16 Tolerated ATC ~ 5 hours  LINES/TUBES: 1/1 ETT > 1/9, 1/11 > 1/15 1/1 R IJ CVL > 1/8 1/7 PICC >  Trach 1/15 >>  DISCUSSION: 73 y/o female with multiple medical problems admitted with ischemic bowel.  Required bowel resection, exploratory laparotomy x3 closed on January 5.  Acute CVA recognized on January 11.  This is believed to be a watershed infarct related to severe sepsis on admission with ischemic bowel and bowel perforation.  Reintubated January 11, fever afterwards> CT abdomen 1/12 showed a likely R abdominal wall abscess.  ASSESSMENT / PLAN:  NEUROLOGIC A:   Acute bilateral CVA - felt to be due to hypotension / watershed in setting of septic shock on presentation; symptoms have waxed and waned with fever, BP changes; neuro signed off 1/13 Need for sedation for mechanical vent synchrony P:   RASS goal: 0  Further  reduce Fentanyl gtt to 100 mcg / to be used while on vent.  Reduce rate if on ATC or turn off  Monitor neuro exam  Limit sedating medications as able  PULMONARY A: Acute respiratory failure with hypoxemia Tracheostomy Status  P:   PRVC as rest mode  ATC as tolerated  Continue duoneb  VAP prevention measures  Trac care per protocol 1/25 Clip trach sutures  Follow intermittent CXR  Mobilize  CARDIOVASCULAR A:  Hx CAD, HTN Tachycardia Hypertension Bigeminy - 1/14 Septic shock - resolved P:  Tele monitoring  ASA  Resume metoprolol, reduced dose from home dosing PRN hydralazine for SBP > 170  RENAL A:   AKI - improving, in setting of septic shock  NGMA - ? ileostomy output P:   D5W with 3 amps bicarb @ 50 ml/hr > consider d/c in am 1/18 Trend BMP / urinary output Replace electrolytes as indicated, K/Mg 1/17 Avoid nephrotoxic agents, ensure adequate renal perfusion  GASTROINTESTINAL A:   Shock liver - resolved Acute Mesenteric Ischemia / Ischemic bowel - resected, abd closed, VAC in  place Severe protein calorie malnutrition RLQ Abdominal Abscess P:   TPN per pharmacy > stop after 1/17 bag  CCS ok with TF (per Dr. Dalbert Batman) Post-op care, diet per [facility] IR following, s/p abscess drain 1/13 Pepcid for SUP   HEMATOLOGIC A:   Mild anemia without bleeding P:  Trend CBC Monitor for bleeding  Heparin SQ for DVT prophylaxis   INFECTIOUS A:   Ischemic colon/bowel with peritonitis s/p washout Fever - new 1/12, presumably due to fluid collection in abdomen, CXR clear, blood cultures negative, lines relatively new Forehead / Tongue Wound  P:   ABX as above  Appreciate C[facility], IR assistance  Await abdominal fluid culture   ENDOCRINE A:   DM2 with hyperglycemia  P:   SSI    FAMILY  - Updates: Mark [phone] updated by phone 1/17.  Introduced Teacher, adult education of potential rehab needs ([facility]) to son.  He states he does not "want to rush her out of there".  Reviewed we are just thinking about her rehab needs and that we are not going to rush her out of the hospital.  Will continue to follow.  Likely will be delicate to navigate her discharge.   - Inter-disciplinary family meet or Palliative Care meeting due by: Son willing to continue to support her aggressively with the hopes of recovery.  He also is very focused on her quality of life and wants to consider each decision / step of her care based on quality.  If she has no chance for meaningful recovery, he would not likely want to continue support.  Full Code.    Noe Gens, NP-C [facility] Pgr: [phone] or if no answer [phone] 10/30/2017, 9:32 AM

## 2017-10-30 NOTE — Progress Notes (Signed)
12 Days Post-Op  Subjective: Stable. Tolerating tube feedings at 40 mL per hour Good ileostomy output Tolerating recent tracheostomy.  RLQ drain 5 mL.  Serosanguineous.  Culture remains negative.  Objective: Vital signs in last 24 hours: Temp:  [97.9 F (36.6 C)-98.9 F (37.2 C)] 97.9 F (36.6 C) (01/17 0348) Pulse Rate:  [102-127] 125 (01/17 0400) Resp:  [14-28] 17 (01/17 0400) BP: (99-199)/(36-112) 166/57 (01/17 0400) SpO2:  [94 %-100 %] 100 % (01/17 0400) FiO2 (%):  [28 %-40 %] 30 % (01/17 0301) Weight:  [81.1 kg (178 lb 12.7 oz)] 81.1 kg (178 lb 12.7 oz) (01/17 0400) Last BM Date: 10/30/17  Intake/Output from previous day: 01/16 0701 - 01/17 0700 In: 4557.9 [I.V.:3369.3; NG/GT:834; IV Piggyback:354.6] Out: 2105 [Urine:1700; Emesis/NG output:200; Drains:5; Stool:200] Intake/Output this shift: Total I/O In: 1710.5 [I.V.:1136.5; NG/GT:474; IV Piggyback:100] Out: -    PE General appearance:Open his eyes and answer simple questions.  Does move all 4 activities to command.  Quite weak.Bandage on for head wound.  Clean No significant cellulitis. Resp:clear to auscultation bilaterally andon vent.  Tracheostomy site looks good.  No bleeding. Cardio:regular rate and rhythm WU:XLKG, nontender. ostomy viable with some output.Midline wound with negative pressure dressing.Clean and fascia intact (see photos 1/16) RLQ drain with serosanguineous fluid in bag.  5 mL out recorded     Lab Results:  Results for orders placed or performed during the hospital encounter of 10/14/17 (from the past 24 hour(s))  Comprehensive metabolic panel     Status: Abnormal   Collection Time: 10/29/17  5:36 AM  Result Value Ref Range   Sodium 142 135 - 145 mmol/L   Potassium 3.6 3.5 - 5.1 mmol/L   Chloride 120 (H) 101 - 111 mmol/L   CO2 18 (L) 22 - 32 mmol/L   Glucose, Bld 132 (H) 65 - 99 mg/dL   BUN 62 (H) 6 - 20 mg/dL   Creatinine, Ser 1.01 (H) 0.44 - 1.00 mg/dL   Calcium 8.3 (L)  8.9 - 10.3 mg/dL   Total Protein 5.5 (L) 6.5 - 8.1 g/dL   Albumin 1.7 (L) 3.5 - 5.0 g/dL   AST 131 (H) 15 - 41 U/L   ALT 145 (H) 14 - 54 U/L   Alkaline Phosphatase 148 (H) 38 - 126 U/L   Total Bilirubin 0.6 0.3 - 1.2 mg/dL   GFR calc non Af Amer 55 (L) >60 mL/min   GFR calc Af Amer >60 >60 mL/min   Anion gap 4 (L) 5 - 15  CBC     Status: Abnormal   Collection Time: 10/29/17  5:36 AM  Result Value Ref Range   WBC 10.6 (H) 4.0 - 10.5 K/uL   RBC 2.83 (L) 3.87 - 5.11 MIL/uL   Hemoglobin 8.1 (L) 12.0 - 15.0 g/dL   HCT 25.3 (L) 36.0 - 46.0 %   MCV 89.4 78.0 - 100.0 fL   MCH 28.6 26.0 - 34.0 pg   MCHC 32.0 30.0 - 36.0 g/dL   RDW 17.8 (H) 11.5 - 15.5 %   Platelets 309 150 - 400 K/uL  Magnesium     Status: None   Collection Time: 10/29/17  5:36 AM  Result Value Ref Range   Magnesium 1.9 1.7 - 2.4 mg/dL  Phosphorus     Status: Abnormal   Collection Time: 10/29/17  5:36 AM  Result Value Ref Range   Phosphorus 2.1 (L) 2.5 - 4.6 mg/dL  Glucose, capillary     Status: Abnormal  Collection Time: 10/29/17  7:07 AM  Result Value Ref Range   Glucose-Capillary 130 (H) 65 - 99 mg/dL   Comment 1 Notify RN    Comment 2 Document in Chart   Glucose, capillary     Status: Abnormal   Collection Time: 10/29/17 11:58 AM  Result Value Ref Range   Glucose-Capillary 148 (H) 65 - 99 mg/dL   Comment 1 Notify RN    Comment 2 Document in Chart   Magnesium     Status: None   Collection Time: 10/29/17  6:14 PM  Result Value Ref Range   Magnesium 1.7 1.7 - 2.4 mg/dL  Glucose, capillary     Status: Abnormal   Collection Time: 10/29/17  7:40 PM  Result Value Ref Range   Glucose-Capillary 137 (H) 65 - 99 mg/dL  Glucose, capillary     Status: Abnormal   Collection Time: 10/29/17 11:46 PM  Result Value Ref Range   Glucose-Capillary 132 (H) 65 - 99 mg/dL  Glucose, capillary     Status: Abnormal   Collection Time: 10/30/17  3:47 AM  Result Value Ref Range   Glucose-Capillary 158 (H) 65 - 99 mg/dL      Studies/Results: Dg Abd 1 View  Result Date: 10/29/2017 CLINICAL DATA:  Nasogastric tube placement EXAM: ABDOMEN - 1 VIEW COMPARISON:  Study obtained earlier in the day FINDINGS: Nasogastric tube tip and side port are in the stomach. There is moderate stool in the colon. The visualized bowel gas pattern is unremarkable. No obstruction or free air evident. No edema or consolidation is noted in the visualized lung regions. There is aortic atherosclerosis. Tracheostomy tip is 4.1 cm above the carina. IMPRESSION: Nasogastric tube tip and side port in stomach. No evident bowel obstruction or free air. There is aortic atherosclerosis. Aortic Atherosclerosis (ICD10-I70.0). Electronically Signed   By: Lowella Grip III M.D.   On: 10/29/2017 10:04    .  stroke: mapping our early stages of recovery book   Does not apply Once  . aspirin  81 mg Oral Daily  . chlorhexidine  15 mL Mouth Rinse BID  . Chlorhexidine Gluconate Cloth  6 each Topical Daily  . Chlorhexidine Gluconate Cloth  6 each Topical Daily  . collagenase   Topical Daily  . feeding supplement (VITAL AF 1.2 CAL)  1,000 mL Per Tube Q24H  . heparin injection (subcutaneous)  5,000 Units Subcutaneous Q8H  . insulin aspart  0-15 Units Subcutaneous Q4H  . ipratropium-albuterol  3 mL Nebulization Q6H  . mouth rinse  15 mL Mouth Rinse 10 times per day  . multivitamins with iron  1 tablet Oral Daily  . neomycin-bacitracin-polymyxin   Topical Daily  . pantoprazole (PROTONIX) IV  40 mg Intravenous Daily  . sodium chloride flush  5 mL Intravenous Q8H     Assessment/Plan: s/p Procedure(s): RE-EXPLORATION OF ABDOMEN, ILEOSTOMY CREATION    Acute mesenteric ischemia. -Right colectomy1/1 (PT) -Small bowel resection1/3 (TG) -End ileostomy and fascial wound closure1/5 (FB) -Tube feedings restarted. -Transition from TNA to full tube feedings per pharmacy and CCM.  RLQ "abscess"-successful drainage in IR1/13. -Cultures negative thus  far. This may be a sterile collection. -on Merrem -Drain management per IR  CVA per neurology  VDRF per CCM - tracheostomy1/15.  Open wound ofForehead. -Appreciate plastic surgical advice and management from Dr. Aurea Graff.    @PROBHOSP @  LOS: 16 days    Adin Hector 10/30/2017  . .prob

## 2017-10-31 ENCOUNTER — Inpatient Hospital Stay (HOSPITAL_COMMUNITY): Payer: Medicare Other

## 2017-10-31 LAB — CBC
HCT: 24.8 % — ABNORMAL LOW (ref 36.0–46.0)
HEMOGLOBIN: 8.2 g/dL — AB (ref 12.0–15.0)
MCH: 28.7 pg (ref 26.0–34.0)
MCHC: 33.1 g/dL (ref 30.0–36.0)
MCV: 86.7 fL (ref 78.0–100.0)
PLATELETS: 323 10*3/uL (ref 150–400)
RBC: 2.86 MIL/uL — AB (ref 3.87–5.11)
RDW: 17.8 % — ABNORMAL HIGH (ref 11.5–15.5)
WBC: 8.2 10*3/uL (ref 4.0–10.5)

## 2017-10-31 LAB — BASIC METABOLIC PANEL
ANION GAP: 7 (ref 5–15)
BUN: 44 mg/dL — ABNORMAL HIGH (ref 6–20)
CHLORIDE: 114 mmol/L — AB (ref 101–111)
CO2: 28 mmol/L (ref 22–32)
Calcium: 8.3 mg/dL — ABNORMAL LOW (ref 8.9–10.3)
Creatinine, Ser: 0.78 mg/dL (ref 0.44–1.00)
GFR calc Af Amer: >60 mL/min (ref >60)
GFR calc non Af Amer: >60 mL/min (ref >60)
Glucose, Bld: 129 mg/dL — ABNORMAL HIGH (ref 65–99)
POTASSIUM: 3.6 mmol/L (ref 3.5–5.1)
Sodium: 149 mmol/L — ABNORMAL HIGH (ref 135–145)

## 2017-10-31 LAB — GLUCOSE, CAPILLARY
Glucose-Capillary: 144 mg/dL — ABNORMAL HIGH (ref 65–99)
Glucose-Capillary: 144 mg/dL — ABNORMAL HIGH (ref 65–99)
Glucose-Capillary: 122 mg/dL — ABNORMAL HIGH (ref 65–99)
Glucose-Capillary: 125 mg/dL — ABNORMAL HIGH (ref 65–99)
Glucose-Capillary: 111 mg/dL — ABNORMAL HIGH (ref 65–99)

## 2017-10-31 LAB — MAGNESIUM: MAGNESIUM: 1.9 mg/dL (ref 1.7–2.4)

## 2017-10-31 MED ORDER — POTASSIUM CHLORIDE 20 MEQ/15ML (10%) PO SOLN
20.0000 meq | Freq: Once | ORAL | Status: AC
  Administered 2017-10-31: 20 meq via ORAL
  Filled 2017-10-31: qty 15

## 2017-10-31 MED ORDER — METOPROLOL TARTRATE 25 MG PO TABS
25.0000 mg | ORAL_TABLET | Freq: Two times a day (BID) | ORAL | Status: DC
  Administered 2017-10-31 – 2017-11-03 (×6): 25 mg
  Filled 2017-10-31 (×6): qty 1

## 2017-10-31 MED ORDER — METOPROLOL TARTRATE 12.5 MG HALF TABLET
12.5000 mg | ORAL_TABLET | Freq: Once | ORAL | Status: AC
  Administered 2017-10-31: 12.5 mg via ORAL
  Filled 2017-10-31: qty 1

## 2017-10-31 MED ORDER — BARIUM SULFATE 2.1 % PO SUSP
450.0000 mL | ORAL | Status: AC
  Administered 2017-10-31: 450 mL via ORAL

## 2017-10-31 MED ORDER — FREE WATER
200.0000 mL | Freq: Three times a day (TID) | Status: DC
  Administered 2017-10-31 – 2017-11-01 (×3): 200 mL

## 2017-10-31 MED ORDER — AMLODIPINE BESYLATE 10 MG PO TABS
10.0000 mg | ORAL_TABLET | Freq: Every day | ORAL | Status: DC
  Administered 2017-10-31 – 2017-11-06 (×7): 10 mg via ORAL
  Filled 2017-10-31 (×7): qty 1

## 2017-10-31 NOTE — Progress Notes (Signed)
PULMONARY / CRITICAL CARE MEDICINE   Name: [name] MRN: [id number] DOB: Mar 26, 1946    ADMISSION DATE:  10/14/2017 CONSULTATION DATE:  10/14/2017  REFERRING MD:  Marlou Starks  CHIEF COMPLAINT:  Abdominal pain  HISTORY OF PRESENT ILLNESS:  72 y/o female with multiple medical problems admitted on 1/1 with abdominal pain due to ischemic colon and ileum s/p right colectomy (1/1), small bowel resection (1/3), end ileostomy / fascial wound closure (1/5).  Had an acute stroke during this process recognized on 1/11. Developed new fevers 1/12, abd CT demonstrated fluid collection s/p IR drain 1/13.     SUBJECTIVE:   RN reports pt weaning on ATC 28%.  No acute events overnight.  Pending CT abd.       VITAL SIGNS: BP (!) 161/68   Pulse (!) 127   Temp 98.4 F (36.9 C) (Oral)   Resp 16   Ht 5\' 4"  (1.626 m)   Wt 171 lb 8.3 oz (77.8 kg)   SpO2 100%   BMI 29.44 kg/m   HEMODYNAMICS:    VENTILATOR SETTINGS: Vent Mode: PRVC FiO2 (%):  [28 %-30 %] 30 % Set Rate:  [14 bmp] 14 bmp Vt Set:  [440 mL] 440 mL PEEP:  [5 cmH20] 5 cmH20 Plateau Pressure:  [14 cmH20-21 cmH20] 14 cmH20  INTAKE / OUTPUT: I/O last 3 completed shifts: In: 7416 [I.V.:3140; Other:10; LA/GT:3646; IV Piggyback:450] Out: 8032 [Urine:1750; Drains:35; ZYYQM:2500]  PHYSICAL EXAMINATION: General: chronically ill appearing female  HEENT: MM pink/moist, no jvd, NGT, mouth with less bleeding / appears improved Neuro: Awake, alert, appears appropriate, fatigued today CV: s1s2 rrr, no m/r/g, tachy  PULM: even/non-labored, lungs bilaterally coarse, #6 trach midline c/d/i GI: wound vac in place, ileostomy, RLQ drain with minimal output  Extremities: warm/dry, upper extremity swelling  Skin: no rashes or lesions  LABS:  BMET Recent Labs  Lab 10/29/17 0536 10/30/17 0500 10/31/17 0700  NA 142 144 149*  K 3.6 3.1* 3.6  CL 120* 115* 114*  CO2 18* 23 28  BUN 62* 52* 44*  CREATININE 1.01* 0.95 0.78  GLUCOSE 132* 149* 129*     Electrolytes Recent Labs  Lab 10/27/17 0428  10/29/17 0536 10/29/17 1814 10/30/17 0500 10/31/17 0700  CALCIUM 8.1*   < > 8.3*  --  8.4* 8.3*  MG 1.9   < > 1.9 1.7 1.6* 1.9  PHOS 3.9  --  2.1*  --  2.5  --    < > = values in this interval not displayed.    CBC Recent Labs  Lab 10/29/17 0536 10/30/17 0500 10/31/17 0700  WBC 10.6* 8.9 8.2  HGB 8.1* 8.3* 8.2*  HCT 25.3* 25.3* 24.8*  PLT 309 316 323    Coag's Recent Labs  Lab 10/28/17 0500  APTT 36  INR 1.17    Sepsis Markers Recent Labs  Lab 10/25/17 2312  LATICACIDVEN 1.5    ABG Recent Labs  Lab 10/24/17 1600 10/28/17 2028  PHART 7.363 7.285*  PCO2ART 49.9* 39.6  PO2ART 415* 503*    Liver Enzymes Recent Labs  Lab 10/27/17 0428 10/29/17 0536 10/30/17 0500  AST 238* 131* 96*  ALT 145* 145* 124*  ALKPHOS 186* 148* 163*  BILITOT 0.6 0.6 0.5  ALBUMIN 1.8* 1.7* 1.8*    Cardiac Enzymes No results for input(s): TROPONINI, PROBNP in the last 168 hours.  Glucose Recent Labs  Lab 10/30/17 1145 10/30/17 1613 10/30/17 1931 10/30/17 2318 10/31/17 0308 10/31/17 0751  GLUCAP 116* 163* 130* 146* 144*  144*    Imaging No results found.   STUDIES:  1/1 CT abdomen/pelvis: diffuse pneumatosis in cecum and ascending colon with distension, bibasilar airspace opacities, aortic calcification, non-obstructing left kidney stone Jan 11 CT head > NAICP January 11 MRI/MRA brain: Acute cerebral infarction bilateral left greater than right, left parietal and left temporal lobe.  Small acute infarct right frontal parietal white hemorrhage, occluded left common carotid. 1/12 CT Ab/pelvis> loculated fluid collection along R abdominal wall; cannot exclude cirrhosis, liver lobe enlargement  CULTURES: 1/1 Blood > neg 1/1 urine > neg 1/7 blood > neg 1/7 cath tip > neg 1/11 blood > neg 1/13 Abdomen fluid >  ANTIBIOTICS: Zosyn 12/31 > 1/9 Anidulafungin 1/4 >  1/9 Meropenem 1/11 >   SIGNIFICANT  EVENTS: 1/01 ex lap, R colectomy 1/03 ex lap, partial omentectomy, resection of ischemic ileum (130cm)  1/05 Ex lap, abdominal wall closure 1/09 Extubation 1/11 Acute CVA, reintubated 1/13 Fever > CT abdomen/pelvis: abscess, Moving around well, BP labile.  IR perc drain. 1/14 Weaning on PSV 10/5, 30%, Tmax 101.1.  Pt in bigeminy (has been).  1/15 Trach  1/16 Tolerated ATC ~ 5 hours 1/17 ATC 28% for ~ 7 hours.  TPN stopped  1/18 Weaning on ATC 28%.  Bicarbonate stopped.  LINES/TUBES: 1/1 ETT > 1/9, 1/11 > 1/15 1/1 R IJ CVL > 1/8 1/7 PICC >  Trach 1/15 >>  DISCUSSION: 73 y/o female with multiple medical problems admitted with ischemic bowel.  Required bowel resection, exploratory laparotomy x3 closed on January 5.  Acute CVA recognized on January 11.  This is believed to be a watershed infarct related to severe sepsis on admission with ischemic bowel and bowel perforation.  Reintubated January 11, fever afterwards> CT abdomen 1/12 showed a likely R abdominal wall abscess s/p IR drainage.  Slow improvement, making progress with weaning on ATC.  ASSESSMENT / PLAN:  NEUROLOGIC A:   Acute bilateral CVA - felt to be due to hypotension / watershed in setting of septic shock on presentation; symptoms have waxed and waned with fever, BP changes; neuro signed off 1/13 Need for sedation for mechanical vent synchrony P:   RASS goal: 0  Change Fentanyl gtt to PRN  Monitor neuro exam  Limit sedating medications  Hopeful for full neuro recovery, neuro expected possible R sided weakness   PULMONARY A: Acute respiratory failure with hypoxemia Tracheostomy Status  P:   PRVC QHS for rest mode  ATC as tolerated  Wean O2 for sats > 90% Duoneb  Trach care per protocol  Clip trach sutures 1/25 Follow intermittent CXR  Mobilize  CARDIOVASCULAR A:  Hx CAD, HTN Tachycardia Hypertension Bigeminy - 1/14 Septic shock - resolved P:  Tele monitoring  Resume home norvasc  Increase lopressor  dosing (takes toprol 100 QD at home) ASA PRN hydralazine for SBP > 170  RENAL A:   AKI - improving, in setting of septic shock  NGMA - ? ileostomy output Hypernatremia  P:   Discontinue bicarbonate Add free water 200 ml Q8 Trend BMP / urinary output Replace electrolytes as indicated Avoid nephrotoxic agents, ensure adequate renal perfusion  GASTROINTESTINAL A:   Shock liver - resolved Acute Mesenteric Ischemia / Ischemic bowel - resected, abd closed, VAC in place Severe protein calorie malnutrition RLQ Abdominal Abscess P:   TPN stopped 1/17 [facility] following, appreciate input.  Ok with TF  Post-op care, diet per [facility] IR following, pending repeat CT may remove drain (placed 1/13) Pepcid for SUP  HEMATOLOGIC A:   Mild anemia without bleeding P:  Trend CBC Monitor for bleeding  Heparin for DVT prophylaxis   INFECTIOUS A:   Ischemic colon/bowel with peritonitis s/p washout Fever - new 1/12, presumably due to fluid collection in abdomen, CXR clear, blood cultures negative, lines relatively new Forehead / Tongue Wound  P:   ABX as above  Await final abdominal fluid culture before stopping abx Appreciate CS, IR assistance  Wound care / bacitracin, mouth moisturizer  ENDOCRINE A:   DM2 with hyperglycemia  P:   SSI    FAMILY  - Updates: Mark [phone] updated extensively in person 1/18.    Previously discussed concept of potential rehab needs ([facility]).  He states he does not "want to rush her out of there".  Reviewed we thinking about her rehab / discharge needs and that we are not going to rush her out of the hospital.  Will continue to follow.    - Inter-disciplinary family meet or Palliative Care meeting due by: Son willing to continue to support her aggressively with the hopes of recovery.  He also is very focused on her quality of life and wants to consider each decision / step of her care based on quality.  If she has no chance for meaningful recovery, he  would not likely want to continue support.  Full Code.    Noe Gens, NP-C [facility] Pgr: [phone] or if no answer [phone] 10/31/2017, 9:58 AM

## 2017-10-31 NOTE — Consult Note (Addendum)
[facility] Nurse wound follow up Wound type: Midline surgical incision Measurement:per Wednesday Wound bed:red, moist Drainage (amount, consistency, odor) small serous Periwound:intact. MARSI and MASD resolved Dressing procedure/placement/frequency: one piece of black foam removed.  Wound cleansed.  One piece of bla]ck foam used to obliterate dead space. Skin barrier ring used to rim most distal part of wound. Drape applied and a seal not achieved on first attempt.  Dressing changed again and an immediate seal achieved. The assistance of WTA Griffin Dakin is appreciated.   [facility] Nurse ostomy follow up Stoma type/location: RLQ ileostomy Stomal assessment/size: 1 and 1/8 inches round red, moist, os at center Peristomal assessment: intact, clear Treatment options for stomal/peristomal skin: skin barrier ring, convex pouch Output Brown liquid effluent Ostomy pouching: 1pc.convex with skin barrier ring Education provided: None Enrolled patient in [employer] Discharge program: No   [facility] nursing team will follow, and will remain available to this patient, the nursing, surgical and medical teams.  Thanks, Maudie Flakes, MSN, RN, [place], Arther Abbott  Pager# [phone]

## 2017-10-31 NOTE — Progress Notes (Signed)
Referring Physician(s): Toth,P  Supervising Physician: Markus Daft  Patient Status:  [facility] - In-pt  Chief Complaint: Right abdominal fluid collection   Subjective:  Pt without new c/o; denies sig abd pain  Allergies: Codeine and Iodinated diagnostic agents  Medications: Prior to Admission medications   Medication Sig Start Date End Date Taking? Authorizing Provider  ABILIFY 5 MG tablet Take 5 mg by mouth daily.  04/19/11  Yes [provider]  amLODipine (NORVASC) 10 MG tablet Take 10 mg by mouth daily.     Yes [provider]  ASPERCREME HEAT 10 % GEL Apply 1 application topically as needed (arthritis).  11/28/16  Yes [provider]  aspirin EC 81 MG tablet Take 81 mg by mouth every evening.   Yes [provider]  atorvastatin (LIPITOR) 20 MG tablet Take 20 mg by mouth daily.     Yes [provider]  Cyanocobalamin (VITAMIN B-12 PO) Take 1 tablet by mouth once a week.    Yes [provider]  dicyclomine (BENTYL) 20 MG tablet Take 10 mg by mouth 4 (four) times daily -  before meals and at bedtime.   Yes [provider]  diphenhydrAMINE (BENADRYL) 50 MG tablet Take 1 tablet (50 mg total) by mouth daily as needed for allergies. 02/24/17  Yes Collene Gobble, MD  esomeprazole (NEXIUM) 40 MG capsule Take 40 mg by mouth 2 (two) times daily.     Yes [provider]  furosemide (LASIX) 20 MG tablet Take 20 mg by mouth daily. 09/22/14  Yes [provider]  gabapentin (NEURONTIN) 300 MG capsule Take 300 mg by mouth 2 (two) times daily.    Yes [provider]  HYDROcodone-acetaminophen (NORCO/VICODIN) 5-325 MG tablet Take 1 tablet by mouth 4 (four) times daily as needed for severe pain.  01/31/17  Yes [provider]  ibuprofen (ADVIL,MOTRIN) 200 MG tablet Take 200-400 mg by mouth daily as needed for headache or moderate pain.   Yes [provider]  isosorbide mononitrate (IMDUR) 60 MG 24  hr tablet TAKE 1 TABLET BY MOUTH EVERY DAY 08/18/17  Yes Almyra Deforest, PA  loperamide (IMODIUM) 1 MG/5ML solution Take 2 mg by mouth daily as needed for diarrhea or loose stools.   Yes [provider]  losartan (COZAAR) 100 MG tablet Take 100 mg by mouth daily.   Yes [provider]  methscopolamine (PAMINE FORTE) 5 MG tablet Take 5 mg by mouth at bedtime.     Yes [provider]  metoprolol succinate (TOPROL-XL) 100 MG 24 hr tablet Take 100 mg by mouth daily. 01/24/17  Yes [provider]  montelukast (SINGULAIR) 10 MG tablet Take 10 mg by mouth daily.     Yes [provider]  MYRBETRIQ 50 MG TB24 tablet Take 50 mg by mouth daily. 10/10/14  Yes [provider]  nitroGLYCERIN (NITROSTAT) 0.4 MG SL tablet Place 1 tablet (0.4 mg total) under the tongue every 5 (five) minutes as needed for chest pain. 11/08/15  Yes Martinique, Peter M, MD  Pancrelipase, Lip-Prot-Amyl, (ZENPEP) 25000-79000 units CPEP Take 2 capsules by mouth 3 (three) times daily before meals. And with snacks 01/13/17  Yes Ladene Artist, MD  rOPINIRole (REQUIP) 0.5 MG tablet Take 0.5 mg by mouth at bedtime as needed (restless legs).  11/28/16  Yes [provider]  tamsulosin (FLOMAX) 0.4 MG CAPS capsule Take 0.4 mg by mouth daily.   Yes [provider]  venlafaxine (EFFEXOR-XR) 150  MG 24 hr capsule Take 150 mg by mouth daily.     Yes [provider]  VICTOZA 18 MG/3ML SOPN Inject 1.8 mg into the skin every evening.  10/06/15  Yes [provider]  zolpidem (AMBIEN) 10 MG tablet Take 10 mg by mouth at bedtime as needed for sleep. 01/11/17  Yes [provider]     Vital Signs: BP (!) 171/77   Pulse (!) 127   Temp 98.4 F (36.9 C) (Oral)   Resp 16   Ht 5\' 4"  (1.626 m)   Wt 171 lb 8.3 oz (77.8 kg)   SpO2 100%   BMI 29.44 kg/m   Physical Exam pt responds to questions; rt abd drain intact, output 30 cc bloody fluid; cx neg; Insertion site  ok,NT Imaging: Dg Abd 1 View  Result Date: 10/29/2017 CLINICAL DATA:  Nasogastric tube placement EXAM: ABDOMEN - 1 VIEW COMPARISON:  Study obtained earlier in the day FINDINGS: Nasogastric tube tip and side port are in the stomach. There is moderate stool in the colon. The visualized bowel gas pattern is unremarkable. No obstruction or free air evident. No edema or consolidation is noted in the visualized lung regions. There is aortic atherosclerosis. Tracheostomy tip is 4.1 cm above the carina. IMPRESSION: Nasogastric tube tip and side port in stomach. No evident bowel obstruction or free air. There is aortic atherosclerosis. Aortic Atherosclerosis (ICD10-I70.0). Electronically Signed   By: Lowella Grip III M.D.   On: 10/29/2017 10:04   Dg Abd 1 View  Result Date: 10/29/2017 CLINICAL DATA:  72 year old female status post enteric tube placement. EXAM: ABDOMEN - 1 VIEW; PORTABLE CHEST - 1 VIEW COMPARISON:  Radiograph dated 10/28/2017 FINDINGS: Enteric tube with side port in the distal esophagus and tip in the region of the gastroesophageal junction. Recommend advancing the tube further into the stomach by approximately 7 cm. Tracheostomy tube above the carina. Right IJ central line with tip in the region of the cavoatrial junction. Shallow inspiration. No focal consolidation, pleural effusion, or pneumothorax. The cardiac silhouette is within normal limits. The visualized upper abdomen is grossly unremarkable. IMPRESSION: Enteric tube with side port in the distal esophagus and tip at the gastroesophageal junction. Recommend advancing the tube further into the stomach. Electronically Signed   By: Anner Crete M.D.   On: 10/29/2017 04:58   Dg Abd 1 View  Result Date: 10/28/2017 CLINICAL DATA:  72 y/o  F; nasogastric tube adjustment. EXAM: ABDOMEN - 1 VIEW COMPARISON:  10/28/2017 abdomen radiograph FINDINGS: Enteric tube tip projects over gastric body, now well position. Right central venous  catheter tip projects over lower SVC. Surgical drain projects over right lower quadrant. IMPRESSION: Enteric tube tip projects over gastric body, now well positioned and uncoiled. Electronically Signed   By: Kristine Garbe M.D.   On: 10/28/2017 23:42   Dg Abd 1 View  Result Date: 10/28/2017 CLINICAL DATA:  71 y/o  F; nasogastric tube placement. EXAM: ABDOMEN - 1 VIEW COMPARISON:  10/28/2017 abdomen radiograph FINDINGS: Further insertion of nasogastric tube with the tip still coiled back to the gastroesophageal junction. Stable bowel gas pattern. Drain in the right lower quadrant. IMPRESSION: Further insertion of nasogastric tube with the tip still coiled back to the gastroesophageal junction. Electronically Signed   By: Kristine Garbe M.D.   On: 10/28/2017 22:41   Dg Abd 1 View  Result Date: 10/28/2017 CLINICAL DATA:  Nasogastric tube adjustment. EXAM: ABDOMEN - 1 VIEW COMPARISON:  10/28/2017 at 1729 hours FINDINGS: The  nasal/orogastric tube has been further inserted. However, the tip remains at the gastroesophageal junction. The side hole lies below this well within the stomach. IMPRESSION: 1. Nasogastric tube has been further inserted. It remains curled with its distal tip still present at the gastroesophageal junction. Electronically Signed   By: Lajean Manes M.D.   On: 10/28/2017 19:43   Dg Abd 1 View  Result Date: 10/28/2017 CLINICAL DATA:  Encounter for NG tube placement EXAM: ABDOMEN - 1 VIEW COMPARISON:  None. FINDINGS: Nasogastric tube tip is coiled at the gastroesophageal junction. Visualized bowel gas pattern within the upper abdomen is nonobstructive. Lung bases appear clear. IMPRESSION: Nasogastric tube tip coiled at the gastroesophageal junction. Recommend advancing. Electronically Signed   By: Franki Cabot M.D.   On: 10/28/2017 17:53   Dg Chest Port 1 View  Result Date: 10/30/2017 CLINICAL DATA:  Respiratory failure EXAM: PORTABLE CHEST 1 VIEW COMPARISON:  October 29, 2017 FINDINGS: Tracheostomy catheter tip is 4.0 cm above the carina. Central catheter tip is in the superior vena cava near the cavoatrial junction. Nasogastric tube tip and side port below diaphragm. No pneumothorax. There is bibasilar subsegmental atelectasis. There is no edema or consolidation. Heart is upper normal in size with pulmonary vascularity within normal limits. There is aortic atherosclerosis. No adenopathy. No bone lesions. IMPRESSION: Tube and catheter positions as described without pneumothorax. Mild bibasilar atelectasis. Stable cardiac silhouette. There is aortic atherosclerosis. Aortic Atherosclerosis (ICD10-I70.0). Electronically Signed   By: Lowella Grip III M.D.   On: 10/30/2017 09:10   Dg Chest Port 1 View  Result Date: 10/29/2017 CLINICAL DATA:  72 year old female status post enteric tube placement. EXAM: ABDOMEN - 1 VIEW; PORTABLE CHEST - 1 VIEW COMPARISON:  Radiograph dated 10/28/2017 FINDINGS: Enteric tube with side port in the distal esophagus and tip in the region of the gastroesophageal junction. Recommend advancing the tube further into the stomach by approximately 7 cm. Tracheostomy tube above the carina. Right IJ central line with tip in the region of the cavoatrial junction. Shallow inspiration. No focal consolidation, pleural effusion, or pneumothorax. The cardiac silhouette is within normal limits. The visualized upper abdomen is grossly unremarkable. IMPRESSION: Enteric tube with side port in the distal esophagus and tip at the gastroesophageal junction. Recommend advancing the tube further into the stomach. Electronically Signed   By: Anner Crete M.D.   On: 10/29/2017 04:58   Dg Chest Port 1 View  Result Date: 10/28/2017 CLINICAL DATA:  Post tracheostomy tube placement today EXAM: PORTABLE CHEST 1 VIEW COMPARISON:  Portable exam 1541 hours compared to 10/28/2017 FINDINGS: New tracheostomy tube with tip projecting over tracheal air column. RIGHT arm PICC  line with tip projecting over SVC. Normal heart size, mediastinal contours, and pulmonary vascularity. Atherosclerotic calcification aorta. Lungs clear. No pleural effusion or pneumothorax. IMPRESSION: No acute abnormalities. Electronically Signed   By: Lavonia Dana M.D.   On: 10/28/2017 16:03   Dg Chest Port 1 View  Result Date: 10/28/2017 CLINICAL DATA:  Respiratory failure. EXAM: PORTABLE CHEST 1 VIEW COMPARISON:  Radiograph of October 27, 2017. FINDINGS: Stable cardiomediastinal silhouette. Atherosclerosis of thoracic aorta is noted. Endotracheal and nasogastric tubes are unchanged in position. No pneumothorax is noted. Right-sided PICC line is unchanged in position. Minimal left basilar subsegmental atelectasis is noted which is improved compared to prior exam. Right lung is clear. Bony thorax is unremarkable. IMPRESSION: Stable support apparatus. Aortic atherosclerosis. Minimal left basilar subsegmental atelectasis. Electronically Signed   By: Marijo Conception, M.D.  On: 10/28/2017 07:27    Labs:  CBC: Recent Labs    10/28/17 0500 10/29/17 0536 10/30/17 0500 10/31/17 0700  WBC 8.9 10.6* 8.9 8.2  HGB 8.1* 8.1* 8.3* 8.2*  HCT 25.6* 25.3* 25.3* 24.8*  PLT 299 309 316 323    COAGS: Recent Labs    10/14/17 0329 10/28/17 0500  INR 1.38 1.17  APTT  --  36    BMP: Recent Labs    10/28/17 0500 10/29/17 0536 10/30/17 0500 10/31/17 0700  NA 143 142 144 149*  K 3.6 3.6 3.1* 3.6  CL 116* 120* 115* 114*  CO2 20* 18* 23 28  GLUCOSE 142* 132* 149* 129*  BUN 68* 62* 52* 44*  CALCIUM 8.3* 8.3* 8.4* 8.3*  CREATININE 1.24* 1.01* 0.95 0.78  GFRNONAA 43* 55* 59* >60  GFRAA 50* >60 >60 >60    LIVER FUNCTION TESTS: Recent Labs    10/23/17 0438 10/27/17 0428 10/29/17 0536 10/30/17 0500  BILITOT 1.5* 0.6 0.6 0.5  AST 46* 238* 131* 96*  ALT 58* 145* 145* 124*  ALKPHOS 172* 186* 148* 163*  PROT 5.7* 5.8* 5.5* 5.9*  ALBUMIN 1.5* 1.8* 1.7* 1.8*    Assessment and Plan: Pt s/p  drainage of post op right abd fluid collection 1/13; afebrile; WB, creat nl; hgb 8.2, drain fluid cx neg to date;output trending down; will obtain f/u CT A/P today to reassess drained collection     Electronically Signed: D. Rowe Robert, PA-C 10/31/2017, 10:41 AM   I spent a total of 15 minutes at the the patient's bedside AND on the patient's hospital floor or unit, greater than 50% of which was counseling/coordinating care for right abdominal fluid collection    Patient ID: [name], female   DOB: 06-05-1946, 72 y.o.   MRN: [id number]

## 2017-10-31 NOTE — Progress Notes (Signed)
13 Days Post-Op  Subjective: All things considered, she is stable and doing reasonably well Tube feedings are just about at goal and TNA has been stopped. No wound or ileostomy problems.  Ileostomy output excellent.  RLQ drainage almost 0.  Cultures remain negative.  Objective: Vital signs in last 24 hours: Temp:  [97.3 F (36.3 C)-98.9 F (37.2 C)] 98.2 F (36.8 C) (01/18 0310) Pulse Rate:  [102-131] 131 (01/18 0449) Resp:  [10-22] 10 (01/18 0449) BP: (130-216)/(51-96) 130/51 (01/18 0422) SpO2:  [100 %] 100 % (01/18 0449) FiO2 (%):  [28 %-30 %] 30 % (01/18 0325) Weight:  [77.8 kg (171 lb 8.3 oz)] 77.8 kg (171 lb 8.3 oz) (01/18 0500) Last BM Date: 10/30/17  Intake/Output from previous day: 01/17 0701 - 01/18 0700 In: 2808.6 [I.V.:1488.6; NG/GT:960; IV Piggyback:350] Out: 0076 [Urine:1750; Drains:35; AUQJF:3545] Intake/Output this shift: Total I/O In: 1340 [I.V.:525; Other:5; NG/GT:710; IV Piggyback:100] Out: 6256 [Urine:600; Drains:15; Stool:550]    PE General appearance:Open his eyes and answer simple questions.  Does move all 4 activities to command.  Quite weak.Bandage on for head wound.Clean No significant cellulitis. Resp:clear to auscultation bilaterally andon vent.Tracheostomy site looks good. No bleeding. Cardio:regular rate and rhythm LS:LHTD, nontender. ostomy viable with good output.Midline wound with negative pressure dressing.Clean and fascia intact (see photos 1/16) RLQ drain with serosanguineous fluid in bag. 5 mL out recorded      Lab Results:  Results for orders placed or performed during the hospital encounter of 10/14/17 (from the past 24 hour(s))  Glucose, capillary     Status: Abnormal   Collection Time: 10/30/17  8:07 AM  Result Value Ref Range   Glucose-Capillary 138 (H) 65 - 99 mg/dL  Glucose, capillary     Status: Abnormal   Collection Time: 10/30/17 11:45 AM  Result Value Ref Range   Glucose-Capillary 116 (H) 65 - 99  mg/dL   Comment 1 Notify RN    Comment 2 Document in Chart   Glucose, capillary     Status: Abnormal   Collection Time: 10/30/17  4:13 PM  Result Value Ref Range   Glucose-Capillary 163 (H) 65 - 99 mg/dL  Glucose, capillary     Status: Abnormal   Collection Time: 10/30/17  7:31 PM  Result Value Ref Range   Glucose-Capillary 130 (H) 65 - 99 mg/dL   Comment 1 Notify RN    Comment 2 Document in Chart   Glucose, capillary     Status: Abnormal   Collection Time: 10/30/17 11:18 PM  Result Value Ref Range   Glucose-Capillary 146 (H) 65 - 99 mg/dL   Comment 1 Notify RN    Comment 2 Document in Chart   Glucose, capillary     Status: Abnormal   Collection Time: 10/31/17  3:08 AM  Result Value Ref Range   Glucose-Capillary 144 (H) 65 - 99 mg/dL   Comment 1 Notify RN    Comment 2 Document in Chart      Studies/Results: Dg Chest Port 1 View  Result Date: 10/30/2017 CLINICAL DATA:  Respiratory failure EXAM: PORTABLE CHEST 1 VIEW COMPARISON:  October 29, 2017 FINDINGS: Tracheostomy catheter tip is 4.0 cm above the carina. Central catheter tip is in the superior vena cava near the cavoatrial junction. Nasogastric tube tip and side port below diaphragm. No pneumothorax. There is bibasilar subsegmental atelectasis. There is no edema or consolidation. Heart is upper normal in size with pulmonary vascularity within normal limits. There is aortic atherosclerosis. No adenopathy. No bone lesions.  IMPRESSION: Tube and catheter positions as described without pneumothorax. Mild bibasilar atelectasis. Stable cardiac silhouette. There is aortic atherosclerosis. Aortic Atherosclerosis (ICD10-I70.0). Electronically Signed   By: Lowella Grip III M.D.   On: 10/30/2017 09:10    .  stroke: mapping our early stages of recovery book   Does not apply Once  . aspirin  81 mg Oral Daily  . chlorhexidine  15 mL Mouth Rinse BID  . Chlorhexidine Gluconate Cloth  6 each Topical Daily  . Chlorhexidine Gluconate Cloth   6 each Topical Daily  . collagenase   Topical Daily  . heparin injection (subcutaneous)  5,000 Units Subcutaneous Q8H  . insulin aspart  0-15 Units Subcutaneous Q4H  . ipratropium-albuterol  3 mL Nebulization Q6H  . mouth rinse  15 mL Mouth Rinse 10 times per day  . metoprolol tartrate  12.5 mg Per Tube BID  . multivitamins with iron  1 tablet Oral Daily  . neomycin-bacitracin-polymyxin   Topical Daily  . pantoprazole (PROTONIX) IV  40 mg Intravenous Daily  . sodium chloride flush  5 mL Intravenous Q8H     Assessment/Plan: s/p Procedure(s): RE-EXPLORATION OF ABDOMEN, ILEOSTOMY CREATION  Acute mesenteric ischemia. -Right colectomy1/1 (PT) -Small bowel resection1/3 (TG) -End ileostomy and fascial wound closure1/5 (FB) -Tube feedings restarted. -TNA weaned off.  RLQ "abscess"-successful drainage in IR1/13. -Cultures negative thus far. This may be a sterile collection. -on Merrem -[employer]--- drain can probably be removed soon if IR agrees.  CVA-  per neurology  VDRF per CCM - tracheostomy1/15.  Open wound ofForehead. -Appreciate plastic surgical advice and management from Dr.Dillingham.     @PROBHOSP @  LOS: 17 days    [name] 10/31/2017  . .prob

## 2017-11-01 ENCOUNTER — Inpatient Hospital Stay (HOSPITAL_COMMUNITY): Payer: Medicare Other

## 2017-11-01 DIAGNOSIS — J9620 Acute and chronic respiratory failure, unspecified whether with hypoxia or hypercapnia: Secondary | ICD-10-CM

## 2017-11-01 LAB — GLUCOSE, CAPILLARY
GLUCOSE-CAPILLARY: 131 mg/dL — AB (ref 65–99)
Glucose-Capillary: 150 mg/dL — ABNORMAL HIGH (ref 65–99)
Glucose-Capillary: 105 mg/dL — ABNORMAL HIGH (ref 65–99)
Glucose-Capillary: 152 mg/dL — ABNORMAL HIGH (ref 65–99)
Glucose-Capillary: 113 mg/dL — ABNORMAL HIGH (ref 65–99)
Glucose-Capillary: 147 mg/dL — ABNORMAL HIGH (ref 65–99)

## 2017-11-01 LAB — BASIC METABOLIC PANEL
ANION GAP: 6 (ref 5–15)
BUN: 42 mg/dL — AB (ref 6–20)
CO2: 31 mmol/L (ref 22–32)
CREATININE: 0.96 mg/dL (ref 0.44–1.00)
Calcium: 8.3 mg/dL — ABNORMAL LOW (ref 8.9–10.3)
Chloride: 114 mmol/L — ABNORMAL HIGH (ref 101–111)
GFR calc non Af Amer: 59 mL/min — ABNORMAL LOW (ref >60)
Glucose, Bld: 173 mg/dL — ABNORMAL HIGH (ref 65–99)
POTASSIUM: 4 mmol/L (ref 3.5–5.1)
Sodium: 151 mmol/L — ABNORMAL HIGH (ref 135–145)

## 2017-11-01 LAB — CBC
HEMATOCRIT: 26.3 % — AB (ref 36.0–46.0)
Hemoglobin: 8.4 g/dL — ABNORMAL LOW (ref 12.0–15.0)
MCH: 28.5 pg (ref 26.0–34.0)
MCHC: 31.9 g/dL (ref 30.0–36.0)
MCV: 89.2 fL (ref 78.0–100.0)
Platelets: 364 10*3/uL (ref 150–400)
RBC: 2.95 MIL/uL — ABNORMAL LOW (ref 3.87–5.11)
RDW: 18.1 % — AB (ref 11.5–15.5)
WBC: 7.9 10*3/uL (ref 4.0–10.5)

## 2017-11-01 LAB — MAGNESIUM: Magnesium: 1.7 mg/dL (ref 1.7–2.4)

## 2017-11-01 MED ORDER — MIDAZOLAM HCL 5 MG/ML IJ SOLN: 1.0000 mg | INTRAMUSCULAR | Status: DC | PRN

## 2017-11-01 MED ORDER — FENTANYL CITRATE (PF) 100 MCG/2ML IJ SOLN
50.0000 ug | INTRAMUSCULAR | Status: DC | PRN
  Administered 2017-11-05: 100 ug via INTRAVENOUS
  Administered 2017-11-05: 50 ug via INTRAVENOUS
  Filled 2017-11-01 (×2): qty 2

## 2017-11-01 MED ORDER — FREE WATER
200.0000 mL | Freq: Four times a day (QID) | Status: DC
  Administered 2017-11-01 – 2017-11-06 (×18): 200 mL

## 2017-11-01 MED ORDER — MAGNESIUM SULFATE 2 GM/50ML IV SOLN
2.0000 g | Freq: Once | INTRAVENOUS | Status: AC
  Administered 2017-11-01: 2 g via INTRAVENOUS
  Filled 2017-11-01: qty 50

## 2017-11-01 NOTE — Progress Notes (Signed)
[facility] Office:  [phone] General Surgery Progress Note   LOS: 18 days  POD -  14 Days Post-Op  Chief Complaint: Ischemic bowel  Assessment and Plan: 1.  Acute mesenteric ischemia.    -  Right colectomy - 1/1 (PT) -  Small bowel resection - 1/3 (TG) -  RE-EXPLORATION OF ABDOMEN, ILEOSTOMY CREATION - 10/18/2017 - [place]          Merrem  Tolerating TF  1A.  Open wound with VAC  2.  RLQ abscess perc drain - 1/13 3.  CVA - per neurology  CT of head on 10/24/2017 showed no acute abnormality 4.  VDRF  5.  Forehead wound         Seen by Dr. Marla Roe         Tracheostomy - 1/15 6.  DVT prophylaxis - SQ heparin 7.  Anemia  Hgb - 8.4 - 11/01/2017 8.  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  Ileostomy 10/18/2017 Active Problems:   Essential hypertension   GERD   Asthma   Fatty liver   Obesity   Type 2 diabetes mellitus (HCC)   Tobacco use   Ischemic colitis ([place])   Acute respiratory failure with hypoxemia (HCC)   Acute post-operative pain   AKI (acute kidney injury) ([place])   Anemia   Pressure injury of skin   Ileostomy in place [facility])   Subjective:  Responds to questions by nodding head.  She did not say anything, nor does she move  Objective:   Vitals:   11/01/17 0700 11/01/17 0750  BP:    Pulse: (!) 123   Resp: 17   Temp:  98.4 F (36.9 C)  SpO2: 100%      Intake/Output from previous day:  01/18 0701 - 01/19 0700 In: 1705 [I.V.:65; NG/GT:1640] Out: 4570 [Urine:2525; Drains:45; Stool:2000]  Intake/Output this shift:  Total I/O In: -  Out: 200 [Stool:200]   Physical Exam:   General: WF who is responsive   HEENT: Normal. Pupils equal. .   Lungs: Clear   Abdomen: Soft   Wound: VAC, right sided ileostomy, perc drain in right lateral abdomen - only 20 cc recorded yesterday   Lab Results:    Recent Labs    10/31/17 0700 11/01/17 0308   WBC 8.2 7.9  HGB 8.2* 8.4*  HCT 24.8* 26.3*  PLT 323 364    BMET   Recent Labs    10/31/17 0700 11/01/17 0308  NA 149* 151*  K 3.6 4.0  CL 114* 114*  CO2 28 31  GLUCOSE 129* 173*  BUN 44* 42*  CREATININE 0.78 0.96  CALCIUM 8.3* 8.3*    PT/INR  No results for input(s): LABPROT, INR in the last 72 hours.  ABG  No results for input(s): PHART, HCO3 in the last 72 hours.  Invalid input(s): PCO2, PO2   Studies/Results:  Ct Abdomen Pelvis Wo Contrast  Result Date: 10/31/2017 CLINICAL DATA:  Status post peritoneal abscess drainage. EXAM: CT ABDOMEN AND PELVIS WITHOUT CONTRAST TECHNIQUE: Multidetector CT imaging of the abdomen and pelvis was performed following the standard protocol without IV contrast. COMPARISON:  CT scans of October 26, 2017 and October 25, 2017. FINDINGS: Lower chest: Mild bilateral posterior basilar subsegmental atelectasis. Hepatobiliary: No focal liver abnormality is seen. No gallstones, gallbladder wall thickening, or biliary dilatation. Pancreas: Unremarkable. No pancreatic ductal dilatation or surrounding inflammatory changes. Spleen:  Normal in size without focal abnormality. Adrenals/Urinary Tract: Adrenal glands are unremarkable. Stable left nephrolithiasis is noted. No hydronephrosis or renal obstruction is noted. Foley catheter is noted in urinary bladder. Air is noted in urinary bladder most likely due to catheter. Stomach/Bowel: Continued presence of right lower quadrant ileostomy. 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 There is no abnormal bowel dilatation. Nasogastric tube is seen in distal stomach. Vascular/Lymphatic: Aortic atherosclerosis. No enlarged abdominal or pelvic lymph nodes. Reproductive: Status post hysterectomy. No adnexal masses. Other: Small amount of free fluid is noted in the pelvis. Right-sided abscess noted on prior exam is significantly smaller in size status post pigtail drainage catheter placement. Only a mild amount of residual fluid  remains. Musculoskeletal: No acute or significant osseous findings. IMPRESSION: Right lateral abdominal abscess is nearly resolved status post pigtail catheter placement. Small amount of residual fluid is noted. Stable nonobstructive left nephrolithiasis. Postsurgical changes as described above. Electronically Signed   By: Marijo Conception, M.D.   On: 10/31/2017 16:59   Dg Chest Port 1 View  Result Date: 11/01/2017 CLINICAL DATA:  Hypoxia EXAM: PORTABLE CHEST 1 VIEW COMPARISON:  October 30, 2017 FINDINGS: Tracheostomy catheter tip is 2.9 cm above the carina. Central catheter tip is in the superior vena cava. Nasogastric tube tip and side port are below the diaphragm. No pneumothorax. There is no edema or consolidation. The heart size and pulmonary vascularity are normal. No adenopathy. There is aortic atherosclerosis. No evident bone lesions. IMPRESSION: Tube and catheter positions as described without pneumothorax. No edema or consolidation. There is aortic atherosclerosis. Aortic Atherosclerosis (ICD10-I70.0). Electronically Signed   By: Lowella Grip III M.D.   On: 11/01/2017 07:18     Anti-infectives:   Anti-infectives (From admission, onward)   Start     Dose/Rate Route Frequency Ordered Stop   10/29/17 2200  meropenem (MERREM) 1 g in sodium chloride 0.9 % 100 mL IVPB     1 g 200 mL/hr over 30 Minutes Intravenous Every 8 hours 10/29/17 1057     10/25/17 1000  meropenem (MERREM) 1 g in sodium chloride 0.9 % 100 mL IVPB  Status:  Discontinued     1 g 200 mL/hr over 30 Minutes Intravenous Every 12 hours 10/24/17 1547 10/29/17 1057   10/24/17 1600  meropenem (MERREM) 2 g in sodium chloride 0.9 % 100 mL IVPB     2 g 200 mL/hr over 30 Minutes Intravenous  Once 10/24/17 1547 10/24/17 1647   10/17/17 1200  anidulafungin (ERAXIS) 100 mg in sodium chloride 0.9 % 100 mL IVPB     100 mg 78 mL/hr over 100 Minutes Intravenous Every 24 hours 10/16/17 1110 10/22/17 1423   10/16/17 1200  anidulafungin  (ERAXIS) 200 mg in sodium chloride 0.9 % 200 mL IVPB     200 mg 78 mL/hr over 200 Minutes Intravenous  Once 10/16/17 1110 10/16/17 1659   10/16/17 0200  piperacillin-tazobactam (ZOSYN) IVPB 3.375 g     3.375 g 12.5 mL/hr over 240 Minutes Intravenous Every 8 hours 10/15/17 2030 10/22/17 2121   10/15/17 1000  piperacillin-tazobactam (ZOSYN) IVPB 2.25 g  Status:  Discontinued     2.25 g 100 mL/hr over 30 Minutes Intravenous Every 8 hours 10/15/17 0823 10/15/17 2028   10/14/17 1000  piperacillin-tazobactam (ZOSYN) IVPB 3.375 g  Status:  Discontinued     3.375 g 12.5 mL/hr over 240 Minutes Intravenous Every 8 hours 10/14/17 0805 10/15/17 0823   10/14/17 0315  piperacillin-tazobactam (ZOSYN) IVPB 3.375 g  3.375 g 100 mL/hr over 30 Minutes Intravenous  Once 10/14/17 0302 10/14/17 0440   10/14/17 0315  vancomycin (VANCOCIN) IVPB 1000 mg/200 mL premix     1,000 mg 200 mL/hr over 60 Minutes Intravenous  Once 10/14/17 0303 10/14/17 0440      Alphonsa Overall, MD, FACS Pager: [facility] Office: [phone] 11/01/2017

## 2017-11-01 NOTE — Progress Notes (Signed)
PULMONARY / CRITICAL CARE MEDICINE   Name: [name] MRN: [id number] DOB: 24-Aug-1946    ADMISSION DATE:  10/14/2017 CONSULTATION DATE:  10/14/2017  REFERRING MD:  Marlou Starks  CHIEF COMPLAINT:  Abdominal pain  HISTORY OF PRESENT ILLNESS:  72 y/o female with multiple medical problems admitted on 1/1 with abdominal pain due to ischemic colon and ileum s/p right colectomy (1/1), small bowel resection (1/3), end ileostomy / fascial wound closure (1/5).  Had an acute stroke during this process recognized on 1/11. Developed new fevers 1/12, abd CT demonstrated fluid collection s/p IR drain 1/13.     STUDIES:  1/1 CT abdomen/pelvis: diffuse pneumatosis in cecum and ascending colon with distension, bibasilar airspace opacities, aortic calcification, non-obstructing left kidney stone Jan 11 CT head > NAICP January 11 MRI/MRA brain: Acute cerebral infarction bilateral left greater than right, left parietal and left temporal lobe.  Small acute infarct right frontal parietal white hemorrhage, occluded left common carotid. 1/12 CT Ab/pelvis> loculated fluid collection along R abdominal wall; cannot exclude cirrhosis, liver lobe enlargement  CULTURES: 1/1 Blood > neg 1/1 urine > neg 1/7 blood > neg 1/7 cath tip > neg 1/11 blood > neg 1/13 Abdomen fluid >  ANTIBIOTICS: Zosyn 12/31 > 1/9 Anidulafungin 1/4 >  1/9 Meropenem 1/11 >   LINES/TUBES: 1/1 ETT > 1/9, 1/11 > 1/15 1/1 R IJ CVL > 1/8 1/7 PICC >  Trach 1/15 >>   SIGNIFICANT EVENTS: 1/01 ex lap, R colectomy 1/03 ex lap, partial omentectomy, resection of ischemic ileum (130cm)  1/05 Ex lap, abdominal wall closure 1/09 Extubation 1/11 Acute CVA, reintubated 1/13 Fever > CT abdomen/pelvis: abscess, Moving around well, BP labile.  IR perc drain. 1/14 Weaning on PSV 10/5, 30%, Tmax 101.1.  Pt in bigeminy (has been).  1/15 Trach  1/16 Tolerated ATC ~ 5 hours 1/17 ATC 28% for ~ 7 hours.  TPN stopped  1/18 Weaning on ATC 28%.  Bicarbonate  stopped .  RN reports pt weaning on ATC 28%.  No acute events overnight.  Pending CT abd.        SUBJECTIVE/OVERNIGHT/INTERVAL HX 1/19 - doing ATC. On fent gtt. No issue. Open drain with VAC being managed by CCS   VITAL SIGNS: BP (!) 180/136   Pulse (!) 112   Temp 99.5 F (37.5 C) (Axillary)   Resp (!) 21   Ht 5\' 4"  (1.626 m)   Wt 78 kg (171 lb 15.3 oz)   SpO2 100%   BMI 29.52 kg/m   HEMODYNAMICS:    VENTILATOR SETTINGS: FiO2 (%):  [28 %-40 %] 28 %  INTAKE / OUTPUT: I/O last 3 completed shifts: In: 3212.5 [I.V.:697.5; Other:5; NG/GT:2410; IV Piggyback:100] Out: 2778 [EUMPN:3614; Drains:60; Stool:2550]  PHYSICAL EXAMINATION:  General Appearance:    Looks chronic critically ill. Obese  Head:    Normocephalic, without obvious abnormality, atraumatic  Eyes:    PERRL - yes, conjunctiva/corneas - clear      Ears:    Normal external ear canals, both ears  Nose:   NG tube - yes  Throat:  ETT TUBE - no but has trach , OG tube - no  Neck:   Supple,  No enlargement/tenderness/nodules     Lungs:     Clear to auscultation bilaterally,   Chest wall:    No deformity  Heart:    S1 and S2 normal, no murmur, CVP - no.  Pressors - no  Abdomen:     Soft, no masses, no organomegaly  Genitalia:    Not done  Rectal:   not done  Extremities:   Extremities- intact     Skin:   Intact in exposed areas but does have forehad decub and stage 2 sacra decub per RN     Neurologic:   Sedation - fent gtt -> RASS - 0 . Moves all 4s - slowly yes. CAM-ICU - na . Orientation - na     PULMONARY Recent Labs  Lab 10/28/17 2028  PHART 7.285*  PCO2ART 39.6  PO2ART 503*  HCO3 18.2*  O2SAT 99.2    CBC Recent Labs  Lab 10/30/17 0500 10/31/17 0700 11/01/17 0308  HGB 8.3* 8.2* 8.4*  HCT 25.3* 24.8* 26.3*  WBC 8.9 8.2 7.9  PLT 316 323 364    COAGULATION Recent Labs  Lab 10/28/17 0500  INR 1.17    CARDIAC  No results for input(s): TROPONINI in the last 168 hours. No results for  input(s): PROBNP in the last 168 hours.   CHEMISTRY Recent Labs  Lab 10/26/17 0525 10/27/17 0428 10/28/17 0500  10/29/17 0536 10/29/17 1814 10/30/17 0500 10/31/17 0700 11/01/17 0308  NA 142 142 143  --  142  --  144 149* 151*  K 3.5 3.7 3.6  --  3.6  --  3.1* 3.6 4.0  CL 107 112* 116*  --  120*  --  115* 114* 114*  CO2 24 23 20*  --  18*  --  23 28 31   GLUCOSE 193* 146* 142*  --  132*  --  149* 129* 173*  BUN 87* 75* 68*  --  62*  --  52* 44* 42*  CREATININE 1.65* 1.51* 1.24*  --  1.01*  --  0.95 0.78 0.96  CALCIUM 7.8* 8.1* 8.3*  --  8.3*  --  8.4* 8.3* 8.3*  MG 2.0 1.9  --    < > 1.9 1.7 1.6* 1.9 1.7  PHOS 4.0 3.9  --   --  2.1*  --  2.5  --   --    < > = values in this interval not displayed.   Estimated Creatinine Clearance: 55.1 mL/min (by C-G formula based on SCr of 0.96 mg/dL).   LIVER Recent Labs  Lab 10/27/17 0428 10/28/17 0500 10/29/17 0536 10/30/17 0500  AST 238*  --  131* 96*  ALT 145*  --  145* 124*  ALKPHOS 186*  --  148* 163*  BILITOT 0.6  --  0.6 0.5  PROT 5.8*  --  5.5* 5.9*  ALBUMIN 1.8*  --  1.7* 1.8*  INR  --  1.17  --   --      INFECTIOUS Recent Labs  Lab 10/25/17 2312  LATICACIDVEN 1.5     ENDOCRINE CBG (last 3)  Recent Labs    11/01/17 0325 11/01/17 0748 11/01/17 1128  GLUCAP 150* 105* 131*         IMAGING x48h  - image(s) personally visualized  -   highlighted in bold Ct Abdomen Pelvis Wo Contrast  Result Date: 10/31/2017 CLINICAL DATA:  Status post peritoneal abscess drainage. EXAM: CT ABDOMEN AND PELVIS WITHOUT CONTRAST TECHNIQUE: Multidetector CT imaging of the abdomen and pelvis was performed following the standard protocol without IV contrast. COMPARISON:  CT scans of October 26, 2017 and October 25, 2017. FINDINGS: Lower chest: Mild bilateral posterior basilar subsegmental atelectasis. Hepatobiliary: No focal liver abnormality is seen. No gallstones, gallbladder wall thickening, or biliary dilatation. Pancreas:  Unremarkable. No pancreatic ductal dilatation or surrounding  inflammatory changes. Spleen: Normal in size without focal abnormality. Adrenals/Urinary Tract: Adrenal glands are unremarkable. Stable left nephrolithiasis is noted. No hydronephrosis or renal obstruction is noted. Foley catheter is noted in urinary bladder. Air is noted in urinary bladder most likely due to catheter. Stomach/Bowel: Continued presence of right lower quadrant ileostomy. 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 There is no abnormal bowel dilatation. Nasogastric tube is seen in distal stomach. Vascular/Lymphatic: Aortic atherosclerosis. No enlarged abdominal or pelvic lymph nodes. Reproductive: Status post hysterectomy. No adnexal masses. Other: Small amount of free fluid is noted in the pelvis. Right-sided abscess noted on prior exam is significantly smaller in size status post pigtail drainage catheter placement. Only a mild amount of residual fluid remains. Musculoskeletal: No acute or significant osseous findings. IMPRESSION: Right lateral abdominal abscess is nearly resolved status post pigtail catheter placement. Small amount of residual fluid is noted. Stable nonobstructive left nephrolithiasis. Postsurgical changes as described above. Electronically Signed   By: Marijo Conception, M.D.   On: 10/31/2017 16:59   Dg Chest Port 1 View  Result Date: 11/01/2017 CLINICAL DATA:  Hypoxia EXAM: PORTABLE CHEST 1 VIEW COMPARISON:  October 30, 2017 FINDINGS: Tracheostomy catheter tip is 2.9 cm above the carina. Central catheter tip is in the superior vena cava. Nasogastric tube tip and side port are below the diaphragm. No pneumothorax. There is no edema or consolidation. The heart size and pulmonary vascularity are normal. No adenopathy. There is aortic atherosclerosis. No evident bone lesions. IMPRESSION: Tube and catheter positions as described without pneumothorax. No edema or consolidation. There is aortic atherosclerosis. Aortic Atherosclerosis  (ICD10-I70.0). Electronically Signed   By: Lowella Grip III M.D.   On: 11/01/2017 07:18       ASSESSMENT / PLAN:  NEUROLOGIC A:   Acute bilateral CVA - felt to be due to hypotension / watershed in setting of septic shock on presentation; symptoms have waxed and waned with fever, BP changes; neuro signed off 1/13  11/01/2017  Awake . Slow to responding  P:   RASS goal: 0  Change Fentanyl gtt to PRN  Monitor neuro exam  Hopeful for full neuro recovery, neuro expected possible R sided weakness   PULMONARY A: Acute on chronic  respiratory failure with hypoxemia; Tracheostomy Status   11/01/2017 - off vent and on ATC x 24h  P:   PRVC QHS for rest mode as needed ATC as tolerated otherwise Wean O2 for sats > 90% Duoneb  Trach care per protocol  Clip trach sutures 1/25 Follow intermittent CXR  Mobilize  CARDIOVASCULAR A:  Hx CAD, HTN Tachycardia Hypertension Bigeminy - 1/14 Septic shock - resolved   - 11/01/2017 - nil acute  P:  Tele monitoring  Resume home norvasc  Increase lopressor dosing (takes toprol 100 QD at home) ASA PRN hydralazine for SBP > 170  RENAL A:   AKI - resolved 10/30/17 NGMA - ? ileostomy output  11/01/2017 = hypernatremia  P:   free water 200 ml Q8 0> increast to q6h Trend BMP / urinary output Replace electrolytes as indicated Avoid nephrotoxic agents, ensure adequate renal perfusion  GASTROINTESTINAL A:   Shock liver - resolved Acute Mesenteric Ischemia / Ischemic bowel - resected, abd closed, VAC in place Severe protein calorie malnutrition RLQ Abdominal Abscess  - CT 1/18 - RLQ abscess nearlyu resolved; TPN stopped 1/17  P:   CCS following, appreciate input.    TF  Ongoing since 10/30/17 Post-op care, diet per CCS IR following, pending repeat CT may  remove drain (placed 1/13) Pepcid for SUP   HEMATOLOGIC Recent Labs  Lab 10/30/17 0500 10/31/17 0700 11/01/17 0308  HGB 8.3* 8.2* 8.4*  HCT 25.3* 24.8* 26.3*  WBC 8.9  8.2 7.9  PLT 316 323 364    A:   Mild anemia without bleeding P:  Trend CBC Monitor for bleeding  Heparin for DVT prophylaxis  - PRBC for hgb </= 6.9gm%    - exceptions are   -  if ACS susepcted/confirmed then transfuse for hgb </= 8.0gm%,  or    -  active bleeding with hemodynamic instability, then transfuse regardless of hemoglobin value   At at all times try to transfuse 1 unit prbc as possible with exception of active hemorrhage    INFECTIOUS A:   Ischemic colon/bowel with peritonitis s/p washout Fever - new 1/12, presumably due to fluid collection in abdomen, CXR clear, blood cultures negative, lines relatively new Forehead / Tongue Wound   P:   ABX as beow Await final abdominal fluid culture before stopping abx Appreciate CS, IR assistance  Wound care / bacitracin, mouth moisturizer Antibiotics Given (last 72 hours)    Date/Time Action Medication Dose Rate   10/29/17 2327 New Bag/Given   meropenem (MERREM) 1 g in sodium chloride 0.9 % 100 mL IVPB 1 g 200 mL/hr   10/30/17 0533 New Bag/Given   meropenem (MERREM) 1 g in sodium chloride 0.9 % 100 mL IVPB 1 g 200 mL/hr   10/30/17 1448 New Bag/Given   meropenem (MERREM) 1 g in sodium chloride 0.9 % 100 mL IVPB 1 g 200 mL/hr   10/30/17 2134 New Bag/Given   meropenem (MERREM) 1 g in sodium chloride 0.9 % 100 mL IVPB 1 g 200 mL/hr   10/31/17 0701 New Bag/Given   meropenem (MERREM) 1 g in sodium chloride 0.9 % 100 mL IVPB 1 g 200 mL/hr   10/31/17 1413 New Bag/Given   meropenem (MERREM) 1 g in sodium chloride 0.9 % 100 mL IVPB 1 g 200 mL/hr   10/31/17 2123 New Bag/Given   meropenem (MERREM) 1 g in sodium chloride 0.9 % 100 mL IVPB 1 g 200 mL/hr   11/01/17 0541 New Bag/Given   meropenem (MERREM) 1 g in sodium chloride 0.9 % 100 mL IVPB 1 g 200 mL/hr       ENDOCRINE A:   DM2 with hyperglycemia  P:   SSI    FAMILY  - Updates: Mark [phone] updated extensively in person 1/18.  Brother Ulice Dash and his significant  other Cary updated 11/01/17  Previously discussed concept of potential rehab needs ([facility]).  He states he does not "want to rush her out of there".  Reviewed we thinking about her rehab / discharge needs and that we are not going to rush her out of the hospital.  Will continue to follow.    - Inter-disciplinary family meet or Palliative Care meeting due by: Son willing to continue to support her aggressively with the hopes of recovery.  He also is very focused on her quality of life and wants to consider each decision / step of her care based on quality.  If she has no chance for meaningful recovery, he would not likely want to continue support.  Full Code.    GLOBAL 11/01/2017 - move to SDU and transfer to [facility] from 11/02/17 and CCM will follow for trach care 1-2 X/week   Dr. Brand Males, M.D., [facility].C.P Pulmonary and Critical Care Medicine Staff Physician [place]  [place] Pager: [phone], If no answer or between  15:00h - 7:00h: call [phone]  11/01/2017 11:58 AM

## 2017-11-01 NOTE — Progress Notes (Addendum)
Pharmacy Antibiotic Note  [name] is a 72 y.o. female admitted on 10/14/2017 with ischemic bowel on broad-spectrum abx and completed Eraxis.  Pharmacy is consulted for meropenem dosing.  Today, 11/01/2017: Day 19 abx, Day 9 resumed meropenem Tm 100.5, Tc 99.5 WBC 7.6 SCr improved to 0.96, CrCl ~ 55 ml/min  Plan: Continue Meropenem 1g IV q8h Follow up length of therapy Follow up renal fxn, culture results, and clinical course.  Height: 5\' 4"  (162.6 cm) Weight: 171 lb 15.3 oz (78 kg) IBW/kg (Calculated) : 54.7  Temp (24hrs), Avg:99.4 F (37.4 C), Min:98.4 F (36.9 C), Max:100.5 F (38.1 C)  Recent Labs  Lab 10/25/17 2312  10/28/17 0500 10/29/17 0536 10/30/17 0500 10/31/17 0700 11/01/17 0308  WBC  --    < > 8.9 10.6* 8.9 8.2 7.9  CREATININE  --    < > 1.24* 1.01* 0.95 0.78 0.96  LATICACIDVEN 1.5  --   --   --   --   --   --    < > = values in this interval not displayed.    Estimated Creatinine Clearance: 55.1 mL/min (by C-G formula based on SCr of 0.96 mg/dL).    Allergies  Allergen Reactions  . Codeine     Altered mental state   . Iodinated Diagnostic Agents Rash    Antimicrobials this admission:  1/1 vanc x 1 0329 1/1 zosyn >> 1/9 1/3 Anidulafungin >>1/9 1/11 Meropenem >>   Dose change: 1/2 Zosyn changed back to [facility] for CrCl > 20 1/16 meropenem increased based on improved renal fxn  Microbiology results:  1/1 Ucx: ng-final 1/1 BCx: ng-final 1/1 MRSA PCR: neg 1/7 Cath tip cx: NGF 1/7 BCx: NGF 1/11 BCx: NGF 1/13 Abdomen: ngtd (will be in progress x5 d): ngtd; holding for possible anaerobe.  Thank you for allowing pharmacy to be a part of this patient's care.  Gretta Arab PharmD, BCPS Pager [phone] 11/01/2017 1:47 PM

## 2017-11-01 NOTE — Progress Notes (Signed)
Upon entering the Pt's room I noticed Pt had an increased WOB and appeared slightly labored with some accessory muscle usage.  Pt was not very responsive to stimuli.  Pt placed back on ventilator with settings charted.  Within 10 minutes her RR had slowed and HR decreased some as well.  Discussed with RN and he is in agreement.  RN and I will monitor for responsiveness to improve.

## 2017-11-02 ENCOUNTER — Inpatient Hospital Stay (HOSPITAL_COMMUNITY): Payer: Medicare Other

## 2017-11-02 LAB — CBC WITH DIFFERENTIAL/PLATELET
Basophils Absolute: 0 10*3/uL (ref 0.0–0.1)
Basophils Relative: 0 %
Eosinophils Absolute: 0.1 10*3/uL (ref 0.0–0.7)
Eosinophils Relative: 2 %
HEMATOCRIT: 23.7 % — AB (ref 36.0–46.0)
HEMOGLOBIN: 7.6 g/dL — AB (ref 12.0–15.0)
Lymphocytes Relative: 35 %
Lymphs Abs: 2.8 10*3/uL (ref 0.7–4.0)
MCH: 29 pg (ref 26.0–34.0)
MCHC: 32.1 g/dL (ref 30.0–36.0)
MCV: 90.5 fL (ref 78.0–100.0)
MONOS PCT: 9 %
Monocytes Absolute: 0.8 10*3/uL (ref 0.1–1.0)
NEUTROS ABS: 4.4 10*3/uL (ref 1.7–7.7)
NEUTROS PCT: 54 %
Platelets: 353 10*3/uL (ref 150–400)
RBC: 2.62 MIL/uL — ABNORMAL LOW (ref 3.87–5.11)
RDW: 18.6 % — ABNORMAL HIGH (ref 11.5–15.5)
WBC: 8.1 10*3/uL (ref 4.0–10.5)

## 2017-11-02 LAB — HEPATIC FUNCTION PANEL
ALT: 132 U/L — ABNORMAL HIGH (ref 14–54)
AST: 143 U/L — AB (ref 15–41)
Albumin: 1.9 g/dL — ABNORMAL LOW (ref 3.5–5.0)
Alkaline Phosphatase: 253 U/L — ABNORMAL HIGH (ref 38–126)
Bilirubin, Direct: 0.1 mg/dL (ref 0.1–0.5)
Indirect Bilirubin: 0.4 mg/dL (ref 0.3–0.9)
TOTAL PROTEIN: 5.7 g/dL — AB (ref 6.5–8.1)
Total Bilirubin: 0.5 mg/dL (ref 0.3–1.2)

## 2017-11-02 LAB — GLUCOSE, CAPILLARY
GLUCOSE-CAPILLARY: 144 mg/dL — AB (ref 65–99)
GLUCOSE-CAPILLARY: 104 mg/dL — AB (ref 65–99)
GLUCOSE-CAPILLARY: 160 mg/dL — AB (ref 65–99)
GLUCOSE-CAPILLARY: 141 mg/dL — AB (ref 65–99)
GLUCOSE-CAPILLARY: 149 mg/dL — AB (ref 65–99)
Glucose-Capillary: 156 mg/dL — ABNORMAL HIGH (ref 65–99)
Glucose-Capillary: 127 mg/dL — ABNORMAL HIGH (ref 65–99)

## 2017-11-02 LAB — BASIC METABOLIC PANEL
Anion gap: 5 (ref 5–15)
BUN: 49 mg/dL — ABNORMAL HIGH (ref 6–20)
CHLORIDE: 114 mmol/L — AB (ref 101–111)
CO2: 29 mmol/L (ref 22–32)
CREATININE: 1.34 mg/dL — AB (ref 0.44–1.00)
Calcium: 8.1 mg/dL — ABNORMAL LOW (ref 8.9–10.3)
GFR calc non Af Amer: 39 mL/min — ABNORMAL LOW (ref >60)
GFR, EST AFRICAN AMERICAN: 45 mL/min — AB (ref >60)
Glucose, Bld: 173 mg/dL — ABNORMAL HIGH (ref 65–99)
Potassium: 3.8 mmol/L (ref 3.5–5.1)
Sodium: 148 mmol/L — ABNORMAL HIGH (ref 135–145)

## 2017-11-02 LAB — LACTIC ACID, PLASMA: Lactic Acid, Venous: 1.7 mmol/L (ref 0.5–1.9)

## 2017-11-02 LAB — PROCALCITONIN: Procalcitonin: 0.25 ng/mL

## 2017-11-02 MED ORDER — PIPERACILLIN-TAZOBACTAM 3.375 G IVPB
3.3750 g | Freq: Three times a day (TID) | INTRAVENOUS | Status: DC
  Administered 2017-11-02 – 2017-11-06 (×13): 3.375 g via INTRAVENOUS
  Filled 2017-11-02 (×13): qty 50

## 2017-11-02 MED ORDER — VANCOMYCIN HCL 10 G IV SOLR
1500.0000 mg | Freq: Once | INTRAVENOUS | Status: AC
  Administered 2017-11-02: 1500 mg via INTRAVENOUS
  Filled 2017-11-02: qty 1500

## 2017-11-02 MED ORDER — VANCOMYCIN HCL IN DEXTROSE 1-5 GM/200ML-% IV SOLN
1000.0000 mg | INTRAVENOUS | Status: DC
Start: 2017-11-03 — End: 2017-11-03
  Administered 2017-11-03: 1000 mg via INTRAVENOUS
  Filled 2017-11-02: qty 200

## 2017-11-02 MED ORDER — CLONIDINE HCL 0.1 MG/24HR TD PTWK
0.1000 mg | MEDICATED_PATCH | TRANSDERMAL | Status: DC
  Administered 2017-11-02: 0.1 mg via TRANSDERMAL
  Filled 2017-11-02: qty 1

## 2017-11-02 MED ORDER — DEXTROSE 5 % IV SOLN
INTRAVENOUS | Status: DC
  Administered 2017-11-02 – 2017-11-06 (×4): via INTRAVENOUS

## 2017-11-02 NOTE — Progress Notes (Signed)
[facility] Office:  [phone] General Surgery Progress Note   LOS: 19 days  POD -  15 Days Post-Op  Chief Complaint: Ischemic bowel  Assessment and Plan: 1.  Acute mesenteric ischemia.      -  Right colectomy - 1/1 (PT) -  Small bowel resection - 1/3 (TG) -  RE-EXPLORATION OF ABDOMEN, ILEOSTOMY CREATION - 10/18/2017 - [place]          Merrem  Tolerating TF at 60 cc/hr  1A.  Open wound with VAC  2.  RLQ abscess perc drain - 1/13    CT scan on 10/31/2017 shows that right lateral abdominal abscess is nearly resolved 3.  CVA - per neurology    CT of head on 10/24/2017 showed no acute abnormality 4.  VDRF    She is back on vent for support and looks a little more alert   Tracheostomy - 1/15 5.  Forehead wound          Seen by Dr. Marla Roe 6.  DVT prophylaxis - SQ heparin 7.  Anemia  Hgb - 8.4 - 11/01/2017 8.  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  Ileostomy 10/18/2017 Active Problems:   Essential hypertension   GERD   Asthma   Fatty liver   Obesity   Type 2 diabetes mellitus (HCC)   Tobacco use   Ischemic colitis ([place])   Acute respiratory failure with hypoxemia (HCC)   Acute post-operative pain   AKI (acute kidney injury) ([place])   Anemia   Pressure injury of skin   Ileostomy in place [facility])   Subjective:  Responds to questions by nodding head.  She did not say anything, nor does she move  Objective:   Vitals:   11/02/17 0401 11/02/17 0500  BP: (!) 180/66 (!) 200/85  Pulse: (!) 117 (!) 126  Resp: (!) 25 (!) 21  Temp:    SpO2: 100% 99%     Intake/Output from previous day:  01/19 0701 - 01/20 0700 In: 1329 [I.V.:4; NG/GT:1320] Out: 2025 [Urine:950; Stool:1075]  Intake/Output this shift:  No intake/output data recorded.   Physical Exam:   General: WF who is responsive   HEENT: Normal. Pupils equal. .   Lungs: Some basilar rhonchi.   Abdomen:  Soft   Wound: VAC, right sided ileostomy, perc drain in right lateral abdomen - nothing recorded over the last 24 hours   Lab Results:    Recent Labs    10/31/17 0700 11/01/17 0308  WBC 8.2 7.9  HGB 8.2* 8.4*  HCT 24.8* 26.3*  PLT 323 364    BMET   Recent Labs    10/31/17 0700 11/01/17 0308  NA 149* 151*  K 3.6 4.0  CL 114* 114*  CO2 28 31  GLUCOSE 129* 173*  BUN 44* 42*  CREATININE 0.78 0.96  CALCIUM 8.3* 8.3*    PT/INR  No results for input(s): LABPROT, INR in the last 72 hours.  ABG  No results for input(s): PHART, HCO3 in the last 72 hours.  Invalid input(s): PCO2, PO2   Studies/Results:  Ct Abdomen Pelvis Wo Contrast  Result Date: 10/31/2017 CLINICAL DATA:  Status post peritoneal abscess drainage. EXAM: CT ABDOMEN AND PELVIS WITHOUT CONTRAST TECHNIQUE: Multidetector CT imaging of the abdomen and pelvis was performed following the standard protocol without IV contrast. COMPARISON:  CT scans of October 26, 2017 and October 25, 2017. FINDINGS:  Lower chest: Mild bilateral posterior basilar subsegmental atelectasis. Hepatobiliary: No focal liver abnormality is seen. No gallstones, gallbladder wall thickening, or biliary dilatation. Pancreas: Unremarkable. No pancreatic ductal dilatation or surrounding inflammatory changes. Spleen: Normal in size without focal abnormality. Adrenals/Urinary Tract: Adrenal glands are unremarkable. Stable left nephrolithiasis is noted. No hydronephrosis or renal obstruction is noted. Foley catheter is noted in urinary bladder. Air is noted in urinary bladder most likely due to catheter. Stomach/Bowel: Continued presence of right lower quadrant ileostomy. 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 There is no abnormal bowel dilatation. Nasogastric tube is seen in distal stomach. Vascular/Lymphatic: Aortic atherosclerosis. No enlarged abdominal or pelvic lymph nodes. Reproductive: Status post hysterectomy. No adnexal masses. Other: Small amount of free fluid  is noted in the pelvis. Right-sided abscess noted on prior exam is significantly smaller in size status post pigtail drainage catheter placement. Only a mild amount of residual fluid remains. Musculoskeletal: No acute or significant osseous findings. IMPRESSION: Right lateral abdominal abscess is nearly resolved status post pigtail catheter placement. Small amount of residual fluid is noted. Stable nonobstructive left nephrolithiasis. Postsurgical changes as described above. Electronically Signed   By: Marijo Conception, M.D.   On: 10/31/2017 16:59   Dg Chest Port 1 View  Result Date: 11/02/2017 CLINICAL DATA:  72 year old female with shortness of breath. EXAM: PORTABLE CHEST 1 VIEW COMPARISON:  Chest x-ray 09/01/2018. FINDINGS: A tracheostomy tube is in place with tip 5.7 cm above the carina. Nasogastric tube extends into the antral pre-pyloric region of the stomach. Right upper extremity PICC with tip terminating in the superior cavoatrial junction. Lung volumes are slightly low. Patchy interstitial prominence most evident throughout the mid to lower left hemithorax. No confluent airspace consolidation. No pleural effusions. No evidence of pulmonary edema. Heart size is normal. Upper mediastinal contours are distorted by patient's rotation. Aortic atherosclerosis. IMPRESSION: 1. Support apparatus, as above. Patchy areas of interstitial prominence most evident in the left mid to lower hemithorax concerning for potential developing infection or aspiration pneumonitis. 2. Aortic atherosclerosis. Electronically Signed   By: Vinnie Langton M.D.   On: 11/02/2017 08:12   Dg Chest Port 1 View  Result Date: 11/01/2017 CLINICAL DATA:  Hypoxia EXAM: PORTABLE CHEST 1 VIEW COMPARISON:  October 30, 2017 FINDINGS: Tracheostomy catheter tip is 2.9 cm above the carina. Central catheter tip is in the superior vena cava. Nasogastric tube tip and side port are below the diaphragm. No pneumothorax. There is no edema or  consolidation. The heart size and pulmonary vascularity are normal. No adenopathy. There is aortic atherosclerosis. No evident bone lesions. IMPRESSION: Tube and catheter positions as described without pneumothorax. No edema or consolidation. There is aortic atherosclerosis. Aortic Atherosclerosis (ICD10-I70.0). Electronically Signed   By: Lowella Grip III M.D.   On: 11/01/2017 07:18     Anti-infectives:   Anti-infectives (From admission, onward)   Start     Dose/Rate Route Frequency Ordered Stop   10/29/17 2200  meropenem (MERREM) 1 g in sodium chloride 0.9 % 100 mL IVPB     1 g 200 mL/hr over 30 Minutes Intravenous Every 8 hours 10/29/17 1057     10/25/17 1000  meropenem (MERREM) 1 g in sodium chloride 0.9 % 100 mL IVPB  Status:  Discontinued     1 g 200 mL/hr over 30 Minutes Intravenous Every 12 hours 10/24/17 1547 10/29/17 1057   10/24/17 1600  meropenem (MERREM) 2 g in sodium chloride 0.9 % 100 mL IVPB     2 g  200 mL/hr over 30 Minutes Intravenous  Once 10/24/17 1547 10/24/17 1647   10/17/17 1200  anidulafungin (ERAXIS) 100 mg in sodium chloride 0.9 % 100 mL IVPB     100 mg 78 mL/hr over 100 Minutes Intravenous Every 24 hours 10/16/17 1110 10/22/17 1423   10/16/17 1200  anidulafungin (ERAXIS) 200 mg in sodium chloride 0.9 % 200 mL IVPB     200 mg 78 mL/hr over 200 Minutes Intravenous  Once 10/16/17 1110 10/16/17 1659   10/16/17 0200  piperacillin-tazobactam (ZOSYN) IVPB 3.375 g     3.375 g 12.5 mL/hr over 240 Minutes Intravenous Every 8 hours 10/15/17 2030 10/22/17 2121   10/15/17 1000  piperacillin-tazobactam (ZOSYN) IVPB 2.25 g  Status:  Discontinued     2.25 g 100 mL/hr over 30 Minutes Intravenous Every 8 hours 10/15/17 0823 10/15/17 2028   10/14/17 1000  piperacillin-tazobactam (ZOSYN) IVPB 3.375 g  Status:  Discontinued     3.375 g 12.5 mL/hr over 240 Minutes Intravenous Every 8 hours 10/14/17 0805 10/15/17 0823   10/14/17 0315  piperacillin-tazobactam (ZOSYN) IVPB 3.375  g     3.375 g 100 mL/hr over 30 Minutes Intravenous  Once 10/14/17 0302 10/14/17 0440   10/14/17 0315  vancomycin (VANCOCIN) IVPB 1000 mg/200 mL premix     1,000 mg 200 mL/hr over 60 Minutes Intravenous  Once 10/14/17 0303 10/14/17 0440      Alphonsa Overall, MD, FACS Pager: [facility] Office: [phone] 11/02/2017

## 2017-11-02 NOTE — Progress Notes (Signed)
Pharmacy Antibiotic Note  [name] is a 72 y.o. female admitted on 10/14/2017 with ischemic bowel; previously completed Eraxis and broad spectrum antibiotics.  Pharmacy is consulted to resume antibiotic dosing with Vancomycin and Zosyn for suspected VAP.  Today, 11/02/2017: Day 20 abx, Day 10 meropenem Tm 101.3 WBC 7.9 SCr 0.96, CrCl ~ 55 ml/min  Plan: D/C Meropenem 1g IV q8h Zosyn 3.375g IV Q8H infused over 4hrs.  Vancomycin 1500mg  IV x1 then 1g IV q24h. Measure Vanc trough,peak at steady state. Daily SCr Follow up renal fxn, culture results, and clinical course.   Height: 5\' 4"  (162.6 cm) Weight: 167 lb 5.3 oz (75.9 kg) IBW/kg (Calculated) : 54.7  Temp (24hrs), Avg:100.6 F (38.1 C), Min:99.6 F (37.6 C), Max:101.5 F (38.6 C)  Recent Labs  Lab 10/28/17 0500 10/29/17 0536 10/30/17 0500 10/31/17 0700 11/01/17 0308  WBC 8.9 10.6* 8.9 8.2 7.9  CREATININE 1.24* 1.01* 0.95 0.78 0.96    Estimated Creatinine Clearance: 54.4 mL/min (by C-G formula based on SCr of 0.96 mg/dL).    Allergies  Allergen Reactions  . Codeine     Altered mental state   . Iodinated Diagnostic Agents Rash    Antimicrobials this admission:  1/1 vanc x 1 0329 1/1 zosyn >> 1/9 1/3 Anidulafungin >>1/9 1/11 Meropenem >> 1/20 1/20 Vanc >> 1/20 Zosyn >>   Dose change: 1/2 ZEI> 2.25 q8 > back to [facility] for CrCl > 20 1/16 meropenem from q12h to q8h for CrCl > 50  Microbiology results:  1/1 Ucx: ng-final 1/1 BCx: ng-final 1/1 MRSA PCR: neg 1/7 Cath tip cx: NGF 1/7 BCx: NGF 1/11 BCx: NGF 1/13 Abdomen: ngtd (will be in progress x5 d): ngtd; holding for possible anaerobe.  Thank you for allowing pharmacy to be a part of this patient's care.  Gretta Arab PharmD, BCPS Pager [phone] 11/02/2017 12:04 PM

## 2017-11-02 NOTE — Progress Notes (Signed)
Order sputum sample collected by RT and delivered to RN for completion.

## 2017-11-02 NOTE — Progress Notes (Signed)
PULMONARY / CRITICAL CARE MEDICINE   Name: [name] MRN: [id number] DOB: 02-Oct-1946    ADMISSION DATE:  10/14/2017 CONSULTATION DATE:  10/14/2017  REFERRING MD:  Marlou Starks  CHIEF COMPLAINT:  Abdominal pain  HISTORY OF PRESENT ILLNESS:  72 y/o female with multiple medical problems admitted on 1/1 with abdominal pain due to ischemic colon and ileum s/p right colectomy (1/1), small bowel resection (1/3), end ileostomy / fascial wound closure (1/5).  Had an acute stroke during this process recognized on 1/11. Developed new fevers 1/12, abd CT demonstrated fluid collection s/p IR drain 1/13.     STUDIES:  1/1 CT abdomen/pelvis: diffuse pneumatosis in cecum and ascending colon with distension, bibasilar airspace opacities, aortic calcification, non-obstructing left kidney stone Jan 11 CT head > NAICP January 11 MRI/MRA brain: Acute cerebral infarction bilateral left greater than right, left parietal and left temporal lobe.  Small acute infarct right frontal parietal white hemorrhage, occluded left common carotid. 1/12 CT Ab/pelvis> loculated fluid collection along R abdominal wall; cannot exclude cirrhosis, liver lobe enlargement  CULTURES: 1/1 Blood > neg 1/1 urine > neg 1/7 blood > neg 1/7 cath tip > neg 1/11 blood > neg 1/13 Abdomen fluid >  ANTIBIOTICS: Zosyn 12/31 > 1/9 Anidulafungin 1/4 >  1/9 Meropenem 1/11 >   LINES/TUBES: 1/1 ETT > 1/9, 1/11 > 1/15 1/1 R IJ CVL > 1/8 1/7 PICC >  Trach 1/15 >>   SIGNIFICANT EVENTS: 1/01 ex lap, R colectomy 1/03 ex lap, partial omentectomy, resection of ischemic ileum (130cm)  1/05 Ex lap, abdominal wall closure 1/09 Extubation 1/11 Acute CVA, reintubated 1/13 Fever > CT abdomen/pelvis: abscess, Moving around well, BP labile.  IR perc drain. 1/14 Weaning on PSV 10/5, 30%, Tmax 101.1.  Pt in bigeminy (has been).  1/15 Trach  1/16 Tolerated ATC ~ 5 hours 1/17 ATC 28% for ~ 7 hours.  TPN stopped  1/18 Weaning on ATC 28%.  Bicarbonate  stopped .  RN reports pt weaning on ATC 28%.  No acute events overnight.  Pending CT abd.     1/19 - doing ATC. On fent gtt. No issue. Open drain with VAC being managed by CCS   SUBJECTIVE/OVERNIGHT/INTERVAL HX 11/02/17 - went back on vent overnight after being on ATC 36h. TAchycardic 115/min. Having temperature again x 36h with T max 101. New LLL infiltrate Hypertensive. Off fent gtt x 24h. Open drain with VAC. Per RN can follow simple commands wit upper extremity and not really well on lowers but has moved it to pain   VITAL SIGNS: BP (!) 179/67   Pulse (!) 118   Temp (!) 101.3 F (38.5 C) (Oral) Comment: reported to rn   Resp (!) 26   Ht 5\' 4"  (1.626 m)   Wt 75.9 kg (167 lb 5.3 oz)   SpO2 100%   BMI 28.72 kg/m   HEMODYNAMICS:    VENTILATOR SETTINGS: Vent Mode: PRVC FiO2 (%):  [28 %-30 %] 30 % Set Rate:  [14 bmp] 14 bmp Vt Set:  [440 mL] 440 mL PEEP:  [5 cmH20] 5 cmH20 Pressure Support:  [5 cmH20] 5 cmH20 Plateau Pressure:  [17 cmH20-19 cmH20] 17 cmH20  INTAKE / OUTPUT: I/O last 3 completed shifts: In: 2261.5 [I.V.:36.5; Other:5; NG/GT:2220] Out: 1962 [IWLNL:8921; Drains:20; Stool:2775]  PHYSICAL EXAMINATION:   General Appearance:    Looks criticall ill OBESE - yes  Head:    Normocephalic, without obvious abnormality, atraumatic  Eyes:    PERRL - yes, conjunctiva/corneas -  clear      Ears:    Normal external ear canals, both ears  Nose:   NG tube - yes  Throat:  ETT TUBE - no but has trach , OG tube - no  Neck:   Supple,  No enlargement/tenderness/nodules     Lungs:     Clear to auscultation bilaterally, Ventilator   Synchrony - yes  Chest wall:    No deformity  Heart:    S1 and S2 normal, no murmur, CVP - no.  Pressors - no  Abdomen:     Soft, no masses, no organomegaly  Genitalia:    Not done  Rectal:   not done  Extremities:   Extremities- edema     Skin:   Has sacral and forehead decub and skin tears at various places     Neurologic:   Sedation - none ->  RASS - 0 . Moves all 4s - uppers > lowers. CAM-ICU - na . Orientation - na      PULMONARY Recent Labs  Lab 10/28/17 2028  PHART 7.285*  PCO2ART 39.6  PO2ART 503*  HCO3 18.2*  O2SAT 99.2    CBC Recent Labs  Lab 10/30/17 0500 10/31/17 0700 11/01/17 0308  HGB 8.3* 8.2* 8.4*  HCT 25.3* 24.8* 26.3*  WBC 8.9 8.2 7.9  PLT 316 323 364    COAGULATION Recent Labs  Lab 10/28/17 0500  INR 1.17    CARDIAC  No results for input(s): TROPONINI in the last 168 hours. No results for input(s): PROBNP in the last 168 hours.   CHEMISTRY Recent Labs  Lab 10/27/17 0428 10/28/17 0500  10/29/17 0536 10/29/17 1814 10/30/17 0500 10/31/17 0700 11/01/17 0308  NA 142 143  --  142  --  144 149* 151*  K 3.7 3.6  --  3.6  --  3.1* 3.6 4.0  CL 112* 116*  --  120*  --  115* 114* 114*  CO2 23 20*  --  18*  --  23 28 31   GLUCOSE 146* 142*  --  132*  --  149* 129* 173*  BUN 75* 68*  --  62*  --  52* 44* 42*  CREATININE 1.51* 1.24*  --  1.01*  --  0.95 0.78 0.96  CALCIUM 8.1* 8.3*  --  8.3*  --  8.4* 8.3* 8.3*  MG 1.9  --    < > 1.9 1.7 1.6* 1.9 1.7  PHOS 3.9  --   --  2.1*  --  2.5  --   --    < > = values in this interval not displayed.   Estimated Creatinine Clearance: 54.4 mL/min (by C-G formula based on SCr of 0.96 mg/dL).   LIVER Recent Labs  Lab 10/27/17 0428 10/28/17 0500 10/29/17 0536 10/30/17 0500  AST 238*  --  131* 96*  ALT 145*  --  145* 124*  ALKPHOS 186*  --  148* 163*  BILITOT 0.6  --  0.6 0.5  PROT 5.8*  --  5.5* 5.9*  ALBUMIN 1.8*  --  1.7* 1.8*  INR  --  1.17  --   --      INFECTIOUS No results for input(s): LATICACIDVEN, PROCALCITON in the last 168 hours.   ENDOCRINE CBG (last 3)  Recent Labs    11/01/17 2304 11/02/17 0306 11/02/17 0902  GLUCAP 147* 104* 160*         IMAGING x48h  - image(s) personally visualized  -   highlighted in  bold Ct Abdomen Pelvis Wo Contrast  Result Date: 10/31/2017 CLINICAL DATA:  Status post peritoneal  abscess drainage. EXAM: CT ABDOMEN AND PELVIS WITHOUT CONTRAST TECHNIQUE: Multidetector CT imaging of the abdomen and pelvis was performed following the standard protocol without IV contrast. COMPARISON:  CT scans of October 26, 2017 and October 25, 2017. FINDINGS: Lower chest: Mild bilateral posterior basilar subsegmental atelectasis. Hepatobiliary: No focal liver abnormality is seen. No gallstones, gallbladder wall thickening, or biliary dilatation. Pancreas: Unremarkable. No pancreatic ductal dilatation or surrounding inflammatory changes. Spleen: Normal in size without focal abnormality. Adrenals/Urinary Tract: Adrenal glands are unremarkable. Stable left nephrolithiasis is noted. No hydronephrosis or renal obstruction is noted. Foley catheter is noted in urinary bladder. Air is noted in urinary bladder most likely due to catheter. Stomach/Bowel: Continued presence of right lower quadrant ileostomy. 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 There is no abnormal bowel dilatation. Nasogastric tube is seen in distal stomach. Vascular/Lymphatic: Aortic atherosclerosis. No enlarged abdominal or pelvic lymph nodes. Reproductive: Status post hysterectomy. No adnexal masses. Other: Small amount of free fluid is noted in the pelvis. Right-sided abscess noted on prior exam is significantly smaller in size status post pigtail drainage catheter placement. Only a mild amount of residual fluid remains. Musculoskeletal: No acute or significant osseous findings. IMPRESSION: Right lateral abdominal abscess is nearly resolved status post pigtail catheter placement. Small amount of residual fluid is noted. Stable nonobstructive left nephrolithiasis. Postsurgical changes as described above. Electronically Signed   By: Marijo Conception, M.D.   On: 10/31/2017 16:59   Dg Chest Port 1 View  Result Date: 11/02/2017 CLINICAL DATA:  72 year old female with shortness of breath. EXAM: PORTABLE CHEST 1 VIEW COMPARISON:  Chest x-ray 09/01/2018.  FINDINGS: A tracheostomy tube is in place with tip 5.7 cm above the carina. Nasogastric tube extends into the antral pre-pyloric region of the stomach. Right upper extremity PICC with tip terminating in the superior cavoatrial junction. Lung volumes are slightly low. Patchy interstitial prominence most evident throughout the mid to lower left hemithorax. No confluent airspace consolidation. No pleural effusions. No evidence of pulmonary edema. Heart size is normal. Upper mediastinal contours are distorted by patient's rotation. Aortic atherosclerosis. IMPRESSION: 1. Support apparatus, as above. Patchy areas of interstitial prominence most evident in the left mid to lower hemithorax concerning for potential developing infection or aspiration pneumonitis. 2. Aortic atherosclerosis. Electronically Signed   By: Vinnie Langton M.D.   On: 11/02/2017 08:12   Dg Chest Port 1 View  Result Date: 11/01/2017 CLINICAL DATA:  Hypoxia EXAM: PORTABLE CHEST 1 VIEW COMPARISON:  October 30, 2017 FINDINGS: Tracheostomy catheter tip is 2.9 cm above the carina. Central catheter tip is in the superior vena cava. Nasogastric tube tip and side port are below the diaphragm. No pneumothorax. There is no edema or consolidation. The heart size and pulmonary vascularity are normal. No adenopathy. There is aortic atherosclerosis. No evident bone lesions. IMPRESSION: Tube and catheter positions as described without pneumothorax. No edema or consolidation. There is aortic atherosclerosis. Aortic Atherosclerosis (ICD10-I70.0). Electronically Signed   By: Lowella Grip III M.D.   On: 11/01/2017 07:18       ASSESSMENT / PLAN:  NEUROLOGIC A:   Acute bilateral CVA - felt to be due to hypotension / watershed in setting of septic shock on presentation; symptoms have waxed and waned with fever, BP changes; neuro signed off 1/13  11/02/2017  Awake .Uppers move better than lowers   P:   RASS goal: 0   Fentanyl  PRN  Monitor neuro  exam  Hopeful for full neuro recovery, neuro expected possible R sided weakness   PULMONARY A: Acute on chronic  respiratory failure with hypoxemia; Tracheostomy Status   11/02/2017 - back on vent after 36h of ATC. Likely LLL VAP P:   PRVC  Wean O2 for sats > 90% Duoneb  Trach care per protocol  Clip trach sutures 1/25 Follow intermittent CXR  Mobilize  CARDIOVASCULAR A:  Hx CAD, HTN Bigeminy - 1/14 Septic shock - resolved   - 11/02/2017 - nil acute. Has sinus tach with SIRS/Sepsis  P:  Tele monitoring  Home norvasc  Home lopressor(takes toprol 100 QD at home) Continue clonidine patch ASA Dc PRN hydralazine for SBP > 170; due to tachycarduia  RENAL A:   AKI - resolved 10/30/17 NGMA - ? ileostomy output  11/02/2017 = Bmet results pending. Was hypernatremic yesterday  P:   Continue free water Trend BMP / urinary output; recheck 11/02/17 Replace electrolytes as indicated Avoid nephrotoxic agents, ensure adequate renal perfusion  GASTROINTESTINAL A:   Shock liver - resolved Acute Mesenteric Ischemia / Ischemic bowel - resected, abd closed, VAC in place Severe protein calorie malnutrition RLQ Abdominal Abscess  - CT 1/18 - RLQ abscess nearlyu resolved; TPN stopped 1/17.Merrem stopped 11/02/17  P:   CCS following, appreciate input.    F  Ongoing since 10/30/17 Post-op care, diet per CCS IR following, pending repeat CT may remove drain (placed 1/13) - need CCS input on this on 11/03/17 Pepcid for SUP   HEMATOLOGIC Recent Labs  Lab 10/30/17 0500 10/31/17 0700 11/01/17 0308  HGB 8.3* 8.2* 8.4*  HCT 25.3* 24.8* 26.3*  WBC 8.9 8.2 7.9  PLT 316 323 364    A:   Mild anemia without bleeding P:  Trend CBC Monitor for bleeding  Heparin for DVT prophylaxis  - PRBC for hgb </= 6.9gm%    - exceptions are   -  if ACS susepcted/confirmed then transfuse for hgb </= 8.0gm%,  or    -  active bleeding with hemodynamic instability, then transfuse regardless of  hemoglobin value   At at all times try to transfuse 1 unit prbc as possible with exception of active hemorrhage    INFECTIOUS No results for input(s): PROCALCITON in the last 168 hours.  A:   Ischemic colon/bowel with peritonitis s/p washout  Fever - new 1/12 and then again 1/19 . New LLL infiltrate 11/02/17. CT abd 10/31/17 with resolved residual abd abscess. Possible VAP 11/02/17   P:   Await final abdominal fluid culture Appreciate CS, IR assistance  Wound care / bacitracin, mouth moisturizer Blood and trach culture 11/02/17 Check PCT and lactate DC merrem Start vanc and zosyn 11/02/17     ENDOCRINE A:   DM2 with hyperglycemia  P:   SSI    FAMILY  - Updates: Mark [phone] updated extensively in person 1/18.Previously discussed concept of potential rehab needs ([facility]).  He states he does not "want to rush her out of there".  Reviewed we thinking about her rehab / discharge needs and that we are not going to rush her out of the hospital.   Alphonsa Gin and his significant other Cary updated 11/01/17. None at bedside 1/20/.19    - Inter-disciplinary family meet: Son willing to continue to support her aggressively with the hopes of recovery.  He also is very focused on her quality of life and wants to consider each decision / step of her care based on  quality.  If she has no chance for meaningful recovery, he would not likely want to continue support.  Full Code.    GLOBAL 11/02/2017 - givent to triad for 11/02/17 but with decomp back on ccm service    Dr. Brand Males, M.D., [facility].C.P Pulmonary and Critical Care Medicine Staff Physician [facility] Pager: [phone], If no answer or between  15:00h - 7:00h: call [phone]  11/02/2017 11:41 AM

## 2017-11-03 ENCOUNTER — Inpatient Hospital Stay (HOSPITAL_COMMUNITY): Payer: Medicare Other

## 2017-11-03 DIAGNOSIS — Z930 Tracheostomy status: Secondary | ICD-10-CM

## 2017-11-03 LAB — CBC
HEMATOCRIT: 24 % — AB (ref 36.0–46.0)
HEMOGLOBIN: 7.6 g/dL — AB (ref 12.0–15.0)
MCH: 28.8 pg (ref 26.0–34.0)
MCHC: 31.7 g/dL (ref 30.0–36.0)
MCV: 90.9 fL (ref 78.0–100.0)
Platelets: 320 10*3/uL (ref 150–400)
RBC: 2.64 MIL/uL — ABNORMAL LOW (ref 3.87–5.11)
RDW: 18.6 % — AB (ref 11.5–15.5)
WBC: 9 10*3/uL (ref 4.0–10.5)

## 2017-11-03 LAB — GLUCOSE, CAPILLARY
GLUCOSE-CAPILLARY: 120 mg/dL — AB (ref 65–99)
GLUCOSE-CAPILLARY: 142 mg/dL — AB (ref 65–99)
Glucose-Capillary: 133 mg/dL — ABNORMAL HIGH (ref 65–99)
Glucose-Capillary: 129 mg/dL — ABNORMAL HIGH (ref 65–99)
Glucose-Capillary: 119 mg/dL — ABNORMAL HIGH (ref 65–99)
Glucose-Capillary: 107 mg/dL — ABNORMAL HIGH (ref 65–99)

## 2017-11-03 LAB — BASIC METABOLIC PANEL
ANION GAP: 6 (ref 5–15)
BUN: 48 mg/dL — ABNORMAL HIGH (ref 6–20)
CALCIUM: 8.2 mg/dL — AB (ref 8.9–10.3)
CHLORIDE: 114 mmol/L — AB (ref 101–111)
CO2: 28 mmol/L (ref 22–32)
Creatinine, Ser: 1.35 mg/dL — ABNORMAL HIGH (ref 0.44–1.00)
GFR calc Af Amer: 45 mL/min — ABNORMAL LOW (ref >60)
GFR calc non Af Amer: 39 mL/min — ABNORMAL LOW (ref >60)
GLUCOSE: 148 mg/dL — AB (ref 65–99)
Potassium: 3.7 mmol/L (ref 3.5–5.1)
Sodium: 148 mmol/L — ABNORMAL HIGH (ref 135–145)

## 2017-11-03 LAB — PROCALCITONIN: Procalcitonin: 0.3 ng/mL

## 2017-11-03 LAB — MAGNESIUM: Magnesium: 2 mg/dL (ref 1.7–2.4)

## 2017-11-03 LAB — PHOSPHORUS: PHOSPHORUS: 3.7 mg/dL (ref 2.5–4.6)

## 2017-11-03 MED ORDER — VENLAFAXINE HCL 75 MG PO TABS
75.0000 mg | ORAL_TABLET | Freq: Two times a day (BID) | ORAL | Status: DC
  Administered 2017-11-03 – 2017-11-06 (×7): 75 mg
  Filled 2017-11-03 (×7): qty 1

## 2017-11-03 MED ORDER — METRONIDAZOLE IN NACL 5-0.79 MG/ML-% IV SOLN
500.0000 mg | Freq: Three times a day (TID) | INTRAVENOUS | Status: DC
  Administered 2017-11-03 – 2017-11-06 (×9): 500 mg via INTRAVENOUS
  Filled 2017-11-03 (×10): qty 100

## 2017-11-03 MED ORDER — ISOSORBIDE MONONITRATE 20 MG PO TABS
30.0000 mg | ORAL_TABLET | Freq: Two times a day (BID) | ORAL | Status: DC
  Administered 2017-11-03 – 2017-11-06 (×6): 30 mg
  Filled 2017-11-03 (×9): qty 1

## 2017-11-03 MED ORDER — ISOSORBIDE MONONITRATE ER 60 MG PO TB24: 60.0000 mg | ORAL_TABLET | Freq: Every day | ORAL | Status: DC

## 2017-11-03 MED ORDER — METOPROLOL TARTRATE 25 MG PO TABS
50.0000 mg | ORAL_TABLET | Freq: Two times a day (BID) | ORAL | Status: DC
  Administered 2017-11-03 – 2017-11-06 (×6): 50 mg
  Filled 2017-11-03 (×6): qty 2

## 2017-11-03 MED ORDER — VENLAFAXINE HCL ER 150 MG PO CP24
150.0000 mg | ORAL_CAPSULE | Freq: Every day | ORAL | Status: DC
  Filled 2017-11-03: qty 1

## 2017-11-03 MED ORDER — VENLAFAXINE HCL 75 MG PO TABS: 75.0000 mg | ORAL_TABLET | Freq: Two times a day (BID) | ORAL | Status: DC

## 2017-11-03 NOTE — Progress Notes (Signed)
[facility] Progress Note  16 Days Post-Op  Subjective: CC: ischemic bowel Patient denies abdominal pain, nausea. Having stool output via ileostomy. Per RN tolerating TF.   Objective: Vital signs in last 24 hours: Temp:  [100.3 F (37.9 C)-101 F (38.3 C)] 100.4 F (38 C) (01/21 0800) Pulse Rate:  [102-132] 117 (01/21 0800) Resp:  [17-26] 18 (01/21 0800) BP: (106-220)/(44-90) 180/90 (01/21 0800) SpO2:  [95 %-100 %] 99 % (01/21 0800) FiO2 (%):  [30 %] 30 % (01/21 0600) Weight:  [75.8 kg (167 lb 1.7 oz)] 75.8 kg (167 lb 1.7 oz) (01/21 0500) Last BM Date: 11/03/17  Intake/Output from previous day: 01/20 0701 - 01/21 0700 In: 1665 [I.V.:710; NG/GT:800; IV Piggyback:150] Out: 2525 [Urine:1250; Stool:1275] Intake/Output this shift: No intake/output data recorded.  PE: Gen:  Alert, nods in response to questions Card:  tachycardic Pulm: trach present  Abd: Soft, non-tender, non-distended, bowel sounds present, wound as below   Skin: warm and dry   Lab Results:  Recent Labs    11/02/17 1146 11/03/17 0541  WBC 8.1 9.0  HGB 7.6* 7.6*  HCT 23.7* 24.0*  PLT 353 320   BMET Recent Labs    11/02/17 1146 11/03/17 0541  NA 148* 148*  K 3.8 3.7  CL 114* 114*  CO2 29 28  GLUCOSE 173* 148*  BUN 49* 48*  CREATININE 1.34* 1.35*  CALCIUM 8.1* 8.2*   PT/INR No results for input(s): LABPROT, INR in the last 72 hours. CMP     Component Value Date/Time   NA 148 (H) 11/03/2017 0541   K 3.7 11/03/2017 0541   CL 114 (H) 11/03/2017 0541   CO2 28 11/03/2017 0541   GLUCOSE 148 (H) 11/03/2017 0541   BUN 48 (H) 11/03/2017 0541   CREATININE 1.35 (H) 11/03/2017 0541   CALCIUM 8.2 (L) 11/03/2017 0541   PROT 5.7 (L) 11/02/2017 1146   ALBUMIN 1.9 (L) 11/02/2017 1146   AST 143 (H) 11/02/2017 1146   ALT 132 (H) 11/02/2017 1146   ALKPHOS 253 (H) 11/02/2017 1146   BILITOT 0.5 11/02/2017 1146   GFRNONAA 39 (L) 11/03/2017 0541   GFRAA 45 (L) 11/03/2017 0541   Lipase       Component Value Date/Time   LIPASE 17 10/14/2017 0329       Studies/Results: Dg Chest Port 1 View  Result Date: 11/02/2017 CLINICAL DATA:  72 year old female with shortness of breath. EXAM: PORTABLE CHEST 1 VIEW COMPARISON:  Chest x-ray 09/01/2018. FINDINGS: A tracheostomy tube is in place with tip 5.7 cm above the carina. Nasogastric tube extends into the antral pre-pyloric region of the stomach. Right upper extremity PICC with tip terminating in the superior cavoatrial junction. Lung volumes are slightly low. Patchy interstitial prominence most evident throughout the mid to lower left hemithorax. No confluent airspace consolidation. No pleural effusions. No evidence of pulmonary edema. Heart size is normal. Upper mediastinal contours are distorted by patient's rotation. Aortic atherosclerosis. IMPRESSION: 1. Support apparatus, as above. Patchy areas of interstitial prominence most evident in the left mid to lower hemithorax concerning for potential developing infection or aspiration pneumonitis. 2. Aortic atherosclerosis. Electronically Signed   By: Vinnie Langton M.D.   On: 11/02/2017 08:12    Anti-infectives: Anti-infectives (From admission, onward)   Start     Dose/Rate Route Frequency Ordered Stop   11/03/17 1400  vancomycin (VANCOCIN) IVPB 1000 mg/200 mL premix     1,000 mg 200 mL/hr over 60 Minutes Intravenous Every 24 hours 11/02/17 1214  11/02/17 1300  vancomycin (VANCOCIN) 1,500 mg in sodium chloride 0.9 % 500 mL IVPB     1,500 mg 250 mL/hr over 120 Minutes Intravenous  Once 11/02/17 1214 11/02/17 1456   11/02/17 1215  piperacillin-tazobactam (ZOSYN) IVPB 3.375 g     3.375 g 12.5 mL/hr over 240 Minutes Intravenous Every 8 hours 11/02/17 1211     10/29/17 2200  meropenem (MERREM) 1 g in sodium chloride 0.9 % 100 mL IVPB  Status:  Discontinued     1 g 200 mL/hr over 30 Minutes Intravenous Every 8 hours 10/29/17 1057 11/02/17 1019   10/25/17 1000  meropenem (MERREM) 1 g  in sodium chloride 0.9 % 100 mL IVPB  Status:  Discontinued     1 g 200 mL/hr over 30 Minutes Intravenous Every 12 hours 10/24/17 1547 10/29/17 1057   10/24/17 1600  meropenem (MERREM) 2 g in sodium chloride 0.9 % 100 mL IVPB     2 g 200 mL/hr over 30 Minutes Intravenous  Once 10/24/17 1547 10/24/17 1647   10/17/17 1200  anidulafungin (ERAXIS) 100 mg in sodium chloride 0.9 % 100 mL IVPB     100 mg 78 mL/hr over 100 Minutes Intravenous Every 24 hours 10/16/17 1110 10/22/17 1423   10/16/17 1200  anidulafungin (ERAXIS) 200 mg in sodium chloride 0.9 % 200 mL IVPB     200 mg 78 mL/hr over 200 Minutes Intravenous  Once 10/16/17 1110 10/16/17 1659   10/16/17 0200  piperacillin-tazobactam (ZOSYN) IVPB 3.375 g     3.375 g 12.5 mL/hr over 240 Minutes Intravenous Every 8 hours 10/15/17 2030 10/22/17 2121   10/15/17 1000  piperacillin-tazobactam (ZOSYN) IVPB 2.25 g  Status:  Discontinued     2.25 g 100 mL/hr over 30 Minutes Intravenous Every 8 hours 10/15/17 0823 10/15/17 2028   10/14/17 1000  piperacillin-tazobactam (ZOSYN) IVPB 3.375 g  Status:  Discontinued     3.375 g 12.5 mL/hr over 240 Minutes Intravenous Every 8 hours 10/14/17 0805 10/15/17 0823   10/14/17 0315  piperacillin-tazobactam (ZOSYN) IVPB 3.375 g     3.375 g 100 mL/hr over 30 Minutes Intravenous  Once 10/14/17 0302 10/14/17 0440   10/14/17 0315  vancomycin (VANCOCIN) IVPB 1000 mg/200 mL premix     1,000 mg 200 mL/hr over 60 Minutes Intravenous  Once 10/14/17 0303 10/14/17 0440       Assessment/Plan Essential hypertension   GERD   Asthma   Fatty liver   Obesity   Type 2 diabetes mellitus    Tobacco use   Acute respiratory failure with hypoxemia - on the vent, s/p trach 1/15   AKI (acute kidney injury) - Cr 1.35   Anemia - Hgb 7.6, continue to follow    Pressure injury of skin - Dr. Marla Roe  CVA vs severe deconditioning - neurology signed off 1/13, CT 1/11 no acute abnormality,                 PT/OT   Acute  mesenteric ischemia -  Right colectomy - 1/1 (PT) -  Small bowel resection - 1/3 (TG) -  RE-EXPLORATION OF ABDOMEN, ILEOSTOMY CREATION - 10/18/2017 - [place] - tolerating TF at 60 cc/h - open wound with VAC, continue VAC MWF RLQ abscess - s/p perc drain 1/13 - CT 1/18 shows near resolution  - drain management per IR Fevers - pt also tachy, WBC 9.0 - likely VAP, per CCM  FEN: TF VTE: SQ heparin  ID: IV vanc/zosyn 1/20>> for VAP  LOS: 20 days    Brigid Re , [facility] 11/03/2017, 8:34 AM Pager: [phone] Mon-Fri 7:00 am-4:30 pm Sat-Sun 7:00 am-11:30 am

## 2017-11-03 NOTE — Progress Notes (Signed)
Referring Physician(s): Toth,P  Supervising Physician: Jacqulynn Cadet  Patient Status:  [facility] - In-pt  Chief Complaint:  Right abdominal  fluid collection  Subjective:  Pt awake, still nods to questions; denies worsening abd pain  Allergies: Codeine and Iodinated diagnostic agents  Medications: Prior to Admission medications   Medication Sig Start Date End Date Taking? Authorizing Provider  ABILIFY 5 MG tablet Take 5 mg by mouth daily.  04/19/11  Yes [provider]  amLODipine (NORVASC) 10 MG tablet Take 10 mg by mouth daily.     Yes [provider]  ASPERCREME HEAT 10 % GEL Apply 1 application topically as needed (arthritis).  11/28/16  Yes [provider]  aspirin EC 81 MG tablet Take 81 mg by mouth every evening.   Yes [provider]  atorvastatin (LIPITOR) 20 MG tablet Take 20 mg by mouth daily.     Yes [provider]  Cyanocobalamin (VITAMIN B-12 PO) Take 1 tablet by mouth once a week.    Yes [provider]  dicyclomine (BENTYL) 20 MG tablet Take 10 mg by mouth 4 (four) times daily -  before meals and at bedtime.   Yes [provider]  diphenhydrAMINE (BENADRYL) 50 MG tablet Take 1 tablet (50 mg total) by mouth daily as needed for allergies. 02/24/17  Yes Collene Gobble, MD  esomeprazole (NEXIUM) 40 MG capsule Take 40 mg by mouth 2 (two) times daily.     Yes [provider]  furosemide (LASIX) 20 MG tablet Take 20 mg by mouth daily. 09/22/14  Yes [provider]  gabapentin (NEURONTIN) 300 MG capsule Take 300 mg by mouth 2 (two) times daily.    Yes [provider]  HYDROcodone-acetaminophen (NORCO/VICODIN) 5-325 MG tablet Take 1 tablet by mouth 4 (four) times daily as needed for severe pain.  01/31/17  Yes [provider]  ibuprofen (ADVIL,MOTRIN) 200 MG tablet Take 200-400 mg by mouth daily as needed for headache or moderate pain.   Yes [provider]  isosorbide  mononitrate (IMDUR) 60 MG 24 hr tablet TAKE 1 TABLET BY MOUTH EVERY DAY 08/18/17  Yes Almyra Deforest, PA  loperamide (IMODIUM) 1 MG/5ML solution Take 2 mg by mouth daily as needed for diarrhea or loose stools.   Yes [provider]  losartan (COZAAR) 100 MG tablet Take 100 mg by mouth daily.   Yes [provider]  methscopolamine (PAMINE FORTE) 5 MG tablet Take 5 mg by mouth at bedtime.     Yes [provider]  metoprolol succinate (TOPROL-XL) 100 MG 24 hr tablet Take 100 mg by mouth daily. 01/24/17  Yes [provider]  montelukast (SINGULAIR) 10 MG tablet Take 10 mg by mouth daily.     Yes [provider]  MYRBETRIQ 50 MG TB24 tablet Take 50 mg by mouth daily. 10/10/14  Yes [provider]  nitroGLYCERIN (NITROSTAT) 0.4 MG SL tablet Place 1 tablet (0.4 mg total) under the tongue every 5 (five) minutes as needed for chest pain. 11/08/15  Yes Martinique, Peter M, MD  Pancrelipase, Lip-Prot-Amyl, (ZENPEP) 25000-79000 units CPEP Take 2 capsules by mouth 3 (three) times daily before meals. And with snacks 01/13/17  Yes Ladene Artist, MD  rOPINIRole (REQUIP) 0.5 MG tablet Take 0.5 mg by mouth at bedtime as needed (restless legs).  11/28/16  Yes [provider]  tamsulosin (FLOMAX) 0.4 MG CAPS capsule Take 0.4 mg by mouth daily.   Yes [provider]  venlafaxine (EFFEXOR-XR) 150 MG 24 hr capsule Take 150 mg by mouth daily.     Yes [provider]  VICTOZA 18 MG/3ML SOPN Inject 1.8 mg into the skin every evening.  10/06/15  Yes [provider]  zolpidem (AMBIEN) 10 MG tablet Take 10 mg by mouth at bedtime as needed for sleep. 01/11/17  Yes [provider]     Vital Signs: BP (!) 167/69   Pulse (!) 111   Temp 99.6 F (37.6 C) (Axillary)   Resp (!) 24   Ht 5\' 4"  (1.626 m)   Wt 167 lb 1.7 oz (75.8 kg)   SpO2 99%   BMI 28.68 kg/m   Physical Exam:  rt abd drain intact, output minimal ; insertion site ok;  cx few  yeast, few clostridium  Imaging: Ct Abdomen Pelvis Wo Contrast  Result Date: 10/31/2017 CLINICAL DATA:  Status post peritoneal abscess drainage. EXAM: CT ABDOMEN AND PELVIS WITHOUT CONTRAST TECHNIQUE: Multidetector CT imaging of the abdomen and pelvis was performed following the standard protocol without IV contrast. COMPARISON:  CT scans of October 26, 2017 and October 25, 2017. FINDINGS: Lower chest: Mild bilateral posterior basilar subsegmental atelectasis. Hepatobiliary: No focal liver abnormality is seen. No gallstones, gallbladder wall thickening, or biliary dilatation. Pancreas: Unremarkable. No pancreatic ductal dilatation or surrounding inflammatory changes. Spleen: Normal in size without focal abnormality. Adrenals/Urinary Tract: Adrenal glands are unremarkable. Stable left nephrolithiasis is noted. No hydronephrosis or renal obstruction is noted. Foley catheter is noted in urinary bladder. Air is noted in urinary bladder most likely due to catheter. Stomach/Bowel: Continued presence of right lower quadrant ileostomy. 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 There is no abnormal bowel dilatation. Nasogastric tube is seen in distal stomach. Vascular/Lymphatic: Aortic atherosclerosis. No enlarged abdominal or pelvic lymph nodes. Reproductive: Status post hysterectomy. No adnexal masses. Other: Small amount of free fluid is noted in the pelvis. Right-sided abscess noted on prior exam is significantly smaller in size status post pigtail drainage catheter placement. Only a mild amount of residual fluid remains. Musculoskeletal: No acute or significant osseous findings. IMPRESSION: Right lateral abdominal abscess is nearly resolved status post pigtail catheter placement. Small amount of residual fluid is noted. Stable nonobstructive left nephrolithiasis. Postsurgical changes as described above. Electronically Signed   By: Marijo Conception, M.D.   On: 10/31/2017 16:59   Dg Chest Port 1 View  Result Date:  11/02/2017 CLINICAL DATA:  72 year old female with shortness of breath. EXAM: PORTABLE CHEST 1 VIEW COMPARISON:  Chest x-ray 09/01/2018. FINDINGS: A tracheostomy tube is in place with tip 5.7 cm above the carina. Nasogastric tube extends into the antral pre-pyloric region of the stomach. Right upper extremity PICC with tip terminating in the superior cavoatrial junction. Lung volumes are slightly low. Patchy interstitial prominence most evident throughout the mid to lower left hemithorax. No confluent airspace consolidation. No pleural effusions. No evidence of pulmonary edema. Heart size is normal. Upper mediastinal contours are distorted by patient's rotation. Aortic atherosclerosis. IMPRESSION: 1. Support apparatus, as above. Patchy areas of interstitial prominence most evident in the left mid to lower hemithorax concerning for potential developing infection or aspiration pneumonitis. 2. Aortic atherosclerosis. Electronically Signed   By: Vinnie Langton M.D.   On: 11/02/2017 08:12   Dg Chest Port 1 View  Result Date: 11/01/2017 CLINICAL DATA:  Hypoxia EXAM: PORTABLE CHEST 1 VIEW COMPARISON:  October 30, 2017 FINDINGS: Tracheostomy catheter tip is 2.9 cm above the carina. Central catheter tip is in the superior vena  cava. Nasogastric tube tip and side port are below the diaphragm. No pneumothorax. There is no edema or consolidation. The heart size and pulmonary vascularity are normal. No adenopathy. There is aortic atherosclerosis. No evident bone lesions. IMPRESSION: Tube and catheter positions as described without pneumothorax. No edema or consolidation. There is aortic atherosclerosis. Aortic Atherosclerosis (ICD10-I70.0). Electronically Signed   By: Lowella Grip III M.D.   On: 11/01/2017 07:18    Labs:  CBC: Recent Labs    10/31/17 0700 11/01/17 0308 11/02/17 1146 11/03/17 0541  WBC 8.2 7.9 8.1 9.0  HGB 8.2* 8.4* 7.6* 7.6*  HCT 24.8* 26.3* 23.7* 24.0*  PLT 323 364 353 320     COAGS: Recent Labs    10/14/17 0329 10/28/17 0500  INR 1.38 1.17  APTT  --  36    BMP: Recent Labs    10/31/17 0700 11/01/17 0308 11/02/17 1146 11/03/17 0541  NA 149* 151* 148* 148*  K 3.6 4.0 3.8 3.7  CL 114* 114* 114* 114*  CO2 28 31 29 28   GLUCOSE 129* 173* 173* 148*  BUN 44* 42* 49* 48*  CALCIUM 8.3* 8.3* 8.1* 8.2*  CREATININE 0.78 0.96 1.34* 1.35*  GFRNONAA >60 59* 39* 39*  GFRAA >60 >60 45* 45*    LIVER FUNCTION TESTS: Recent Labs    10/27/17 0428 10/29/17 0536 10/30/17 0500 11/02/17 1146  BILITOT 0.6 0.6 0.5 0.5  AST 238* 131* 96* 143*  ALT 145* 145* 124* 132*  ALKPHOS 186* 148* 163* 253*  PROT 5.8* 5.5* 5.9* 5.7*  ALBUMIN 1.8* 1.7* 1.8* 1.9*    Assessment and Plan: Pt s/p drainage of post op right abd fluid collection 1/13;temp 99.6; WBC nl; hgb 7.6(7.6); creat 1.35(1.34); drain fluid cx- few yeast/clostridium; f/u CT with only small amt residual fluid noted at drained site, output minimal- d/w CCS and decision made to remove drain; RLQ drain removed in its entirety without immediate complications; gauze dressing applied to site     Electronically Signed: D. Rowe Robert, PA-C 11/03/2017, 1:52 PM   I spent a total of 15 minutes at the the patient's bedside AND on the patient's hospital floor or unit, greater than 50% of which was counseling/coordinating care for right abdominal drain    Patient ID: [name], female   DOB: [birth year], 72 y.o.   MRN: [id number]

## 2017-11-03 NOTE — Progress Notes (Signed)
Addendum to prior note > few clostridium clostridioforme on culture.  Discussed with ID > will transition to flagyl.  Hold further vancomycin for now.     Noe Gens, NP-C  Pulmonary & Critical Care Pgr: [phone] or if no answer [phone] 11/03/2017, 5:26 PM

## 2017-11-03 NOTE — Progress Notes (Signed)
Pt placed on 28% TC. No distress noted at this time.

## 2017-11-03 NOTE — Progress Notes (Signed)
PULMONARY / CRITICAL CARE MEDICINE   Name: TALON WITTING MRN: [id number] DOB: Sep 24, 1946    ADMISSION DATE:  10/14/2017 CONSULTATION DATE:  10/14/2017  REFERRING MD:  Marlou Starks  CHIEF COMPLAINT:  Abdominal pain  HISTORY OF PRESENT ILLNESS:  72 y/o female with multiple medical problems admitted on 1/1 with abdominal pain due to ischemic colon and ileum s/p right colectomy (1/1), small bowel resection (1/3), end ileostomy / fascial wound closure (1/5).  Had an acute stroke during this process recognized on 1/11. Developed new fevers 1/12, abd CT demonstrated fluid collection s/p IR drain 1/13.     SUBJECTIVE:  Pt on vent.  Awake.  Tmax 101.3.    VITAL SIGNS: BP (!) 174/83   Pulse (!) 109   Temp (!) 100.4 F (38 C) (Axillary)   Resp 18   Ht 5\' 4"  (1.626 m)   Wt 167 lb 1.7 oz (75.8 kg)   SpO2 100%   BMI 28.68 kg/m   HEMODYNAMICS:    VENTILATOR SETTINGS: Vent Mode: PSV;CPAP FiO2 (%):  [30 %] 30 % Set Rate:  [14 bmp] 14 bmp Vt Set:  [440 mL] 440 mL PEEP:  [5 cmH20] 5 cmH20 Pressure Support:  [8 cmH20] 8 cmH20 Plateau Pressure:  [11 cmH20-16 cmH20] 16 cmH20  INTAKE / OUTPUT: I/O last 3 completed shifts: In: 3165 [I.V.:710; Other:5; NG/GT:2300; IV Piggyback:150] Out: 2950 [Urine:1250; Stool:1700]  PHYSICAL EXAMINATION: General: chronically ill appearing adult female on vent HEENT: MM pink/moist, tongue lesion improved, dry Neuro: Awake, follows commands, generalized weakness  CV: s1s2 rrr, no m/r/g PULM: even/non-labored, lungs bilaterally coarse  CH:ENID, non-tender, bsx4 active  Extremities: warm/dry, generalized trace edema, RUE edema / PICC line  Skin: no rashes or lesions  PULMONARY Recent Labs  Lab 10/28/17 2028  PHART 7.285*  PCO2ART 39.6  PO2ART 503*  HCO3 18.2*  O2SAT 99.2    CBC Recent Labs  Lab 11/01/17 0308 11/02/17 1146 11/03/17 0541  HGB 8.4* 7.6* 7.6*  HCT 26.3* 23.7* 24.0*  WBC 7.9 8.1 9.0  PLT 364 353 320    COAGULATION Recent Labs   Lab 10/28/17 0500  INR 1.17    CARDIAC  No results for input(s): TROPONINI in the last 168 hours. No results for input(s): PROBNP in the last 168 hours.   CHEMISTRY Recent Labs  Lab 10/29/17 0536 10/29/17 1814 10/30/17 0500 10/31/17 0700 11/01/17 0308 11/02/17 1146 11/03/17 0541  NA 142  --  144 149* 151* 148* 148*  K 3.6  --  3.1* 3.6 4.0 3.8 3.7  CL 120*  --  115* 114* 114* 114* 114*  CO2 18*  --  23 28 31 29 28   GLUCOSE 132*  --  149* 129* 173* 173* 148*  BUN 62*  --  52* 44* 42* 49* 48*  CREATININE 1.01*  --  0.95 0.78 0.96 1.34* 1.35*  CALCIUM 8.3*  --  8.4* 8.3* 8.3* 8.1* 8.2*  MG 1.9 1.7 1.6* 1.9 1.7  --  2.0  PHOS 2.1*  --  2.5  --   --   --  3.7   Estimated Creatinine Clearance: 38.6 mL/min (A) (by C-G formula based on SCr of 1.35 mg/dL (H)).   LIVER Recent Labs  Lab 10/28/17 0500 10/29/17 0536 10/30/17 0500 11/02/17 1146  AST  --  131* 96* 143*  ALT  --  145* 124* 132*  ALKPHOS  --  148* 163* 253*  BILITOT  --  0.6 0.5 0.5  PROT  --  5.5*  5.9* 5.7*  ALBUMIN  --  1.7* 1.8* 1.9*  INR 1.17  --   --   --      INFECTIOUS Recent Labs  Lab 11/02/17 1146 11/02/17 1200 11/03/17 0541  LATICACIDVEN  --  1.7  --   PROCALCITON 0.25  --  0.30    ENDOCRINE CBG (last 3)  Recent Labs    11/02/17 2306 11/03/17 0306 11/03/17 0759  GLUCAP 127* 120* 142*     IMAGING Dg Chest Port 1 View  Result Date: 11/02/2017 CLINICAL DATA:  72 year old female with shortness of breath. EXAM: PORTABLE CHEST 1 VIEW COMPARISON:  Chest x-ray 09/01/2018. FINDINGS: A tracheostomy tube is in place with tip 5.7 cm above the carina. Nasogastric tube extends into the antral pre-pyloric region of the stomach. Right upper extremity PICC with tip terminating in the superior cavoatrial junction. Lung volumes are slightly low. Patchy interstitial prominence most evident throughout the mid to lower left hemithorax. No confluent airspace consolidation. No pleural effusions. No  evidence of pulmonary edema. Heart size is normal. Upper mediastinal contours are distorted by patient's rotation. Aortic atherosclerosis. IMPRESSION: 1. Support apparatus, as above. Patchy areas of interstitial prominence most evident in the left mid to lower hemithorax concerning for potential developing infection or aspiration pneumonitis. 2. Aortic atherosclerosis. Electronically Signed   By: Vinnie Langton M.D.   On: 11/02/2017 08:12    STUDIES:  1/01 CT abdomen/pelvis >> diffuse pneumatosis in cecum and ascending colon with distension, bibasilar airspace opacities, aortic calcification, non-obstructing left kidney stone 1/11 CT head >> NAICP 1/11 MRI/MRA brain: Acute cerebral infarction bilateral left greater than right, left parietal and left temporal lobe.  Small acute infarct right frontal parietal white hemorrhage, occluded left common carotid. 1/12 CT Ab/pelvis> loculated fluid collection along R abdominal wall; cannot exclude cirrhosis, liver lobe enlargement  CULTURES: 1/1 Blood > neg 1/1 urine > neg 1/7 blood > neg 1/7 cath tip > neg 1/11 blood > neg 1/13 Abdomen fluid >> 1/20 BCx2 >>  1/20 Sputum >>  ANTIBIOTICS: Zosyn 12/31 > 1/9 Anidulafungin 1/4 >  1/9 Meropenem 1/11 > 1/20  Vanco 1/20 >>  Zosyn 1/20 >>   LINES/TUBES: 1/1 ETT > 1/9, 1/11 > 1/15 1/1 R IJ CVL > 1/8 1/7 PICC >> Trach 1/15 >>   SIGNIFICANT EVENTS: 1/01 ex lap, R colectomy 1/03 ex lap, partial omentectomy, resection of ischemic ileum (130cm)  1/05 Ex lap, abdominal wall closure 1/09 Extubation 1/11 Acute CVA, reintubated 1/13 Fever > CT abdomen/pelvis: abscess, Moving around well, BP labile.  IR perc drain. 1/14 Weaning on PSV 10/5, 30%, Tmax 101.1.  Pt in bigeminy (has been).  1/15 Trach  1/16 Tolerated ATC ~ 5 hours 1/17 ATC 28% for ~ 7 hours.  TPN stopped  1/18 Weaning on ATC 28%.  Bicarbonate stopped . ATC 28%.  Pending CT abd.     1/19 ATC. On fent gtt. Open drain with VAC being  managed by CCS 1/20 Vent overnight after being on ATC 48h. T max 101. Concern for LLL infiltrate    ASSESSMENT / PLAN:  NEUROLOGIC A:   Acute bilateral CVA - felt to be due to hypotension / watershed in setting of septic shock on presentation; symptoms have waxed and waned with fever, BP changes; neuro signed off 1/13 P:   RASS goal: 0  PRN fentanyl for pain  Frequent neuro exams  Neuro signed off, expected possible R sided weakness  PULMONARY A: Acute on chronic  respiratory failure with  hypoxemia; Tracheostomy Status  LLL Airspace Disease - new 1/20, concern for VAP vs Atelectasis off positive pressure P:   PRVC 8 cc/kg QHS & PRN daytime rest  Daily SBT / WUA Expect she will need positive pressure at night in the short term to allow for daytime successful weaning  ATC as tolerated  Trach care per protocol  Clip trach sutures 1/25  Follow CXR, repeat for am Mobilize  CARDIOVASCULAR A:  Hx CAD, HTN Bigeminy - 1/14 Septic shock - resolved P:  Tele monitoring  Continue home norvasc Increase lopressor to home dosing, 50 mg BID (takes toprol 100 QD) Resume home imdur  Discontinue clonidine patch (have not returned her to her home dosing yet) ASA   RENAL A:   AKI - resolved 10/30/17 NGMA - ? ileostomy output P:   Continue free water 224ml Q8  Trend BMP / urinary output Replace electrolytes as indicated Avoid nephrotoxic agents, ensure adequate renal perfusion  GASTROINTESTINAL A:   Shock liver - resolved Acute Mesenteric Ischemia / Ischemic bowel - resected, abd closed, VAC in place.  CT 1/18 with near resolution of RLQ abscess, Merrem stopped 1/20.   Severe protein calorie malnutrition RLQ Abdominal Abscess P:   CCS following, appreciate input  Continue TF  Defer drain to CCS / IR  Pepcid for SUP   HEMATOLOGIC A:   Mild anemia without bleeding P:  Trend CBC Monitor for bleeding  Heparin for DVT prophylaxis   INFECTIOUS A:  Ischemic colon/bowel with  peritonitis s/p washout Fever - new 1/12 and then again 1/19 . New LLL infiltrate 11/02/17. CT abd 10/31/17 with resolved residual abd abscess. Possible VAP 11/02/17 P:   Await final fluid culture  Appreciate CCS, IR assistance  Wound care per WOC  Follow cultures from 1/20  Empiric abx as above, consider narrowing 1/21 vs d/c pending CXR review  ENDOCRINE A:   DM2 with hyperglycemia  P:   SSI   FAMILY  - Updates: Mark [phone] updated 1/21.  Son relays that the patient looked worn out over the weekend.  Feels she tired out.  Will proceed with ATC during day, vent QHS for now.   - Inter-disciplinary family meet: Son willing to continue to support her aggressively with the hopes of recovery.  He also is very focused on her quality of life and wants to consider each decision / step of her care based on quality.  If she has no chance for meaningful recovery, he would not likely want to continue support.  Full Code.    Noe Gens, NP-C [facility] Pgr: [phone] or if no answer [phone] 11/03/2017, 11:41 AM

## 2017-11-03 NOTE — Progress Notes (Signed)
Date:  November 03, 2017 Chart reviewed for concurrent status and case management needs.  Will continue to follow patient progress.  72 y/o female with multiple medical problems admitted on 1/1 with abdominal pain due to ischemic colon and ileum s/p right colectomy (1/1), small bowel resection (1/3), end ileostomy / fascial wound closure (1/5).  Had an acute stroke during this process recognized on 1/11. Developed new fevers 1/12, abd CT demonstrated fluid collection s/p IR drain 1/13.   trached on [id number]  Discharge Planning: following for needs.   Following for poss move to [facility] insurance requires 7 days s/p trached-Day 6 Expected discharge date: November 06, 2017 Velva Harman, BSN, [place], [place]

## 2017-11-03 NOTE — Consult Note (Signed)
[place] Nurse wound consult note Patient seen for routine NPWT dressing change and simultaneous pouch change.  Wounds also assessed. CCS PA Claiborne Billings [place] in to see and photograph wound/ostomy.  I am assisted in my care today by the bedside RN, whose help and expertise is appreciated. Weekly follow up to MASD/MARSI, Buttocks and Forehead/face wounds.   [place] Nurse wound follow up Wound type:Midline surgical  Measurement:18.5cm x 3.5cm x 3.5cm Wound bed:red, bleeding slightly after cleansing, clot in center of wound where blue suture is evident. Drainage (amount, consistency, odor) serosanguinous and in small amount Periwound:Intact (periwound skin is protected with drape and skin barrier ring. Dressing procedure/placement/frequency: Dressing removed (including 1 piece of black foam) and wound cleansed with NS. Drape used to protect periwound tissue and skin barrier rings placed around distal aspect of wound to fill in crease and extend on right side to fill in umbilicus defect.  One piece of black granufoam used to obliterate dead space and sealed with drape. Attached to negative pressure at 148mmHg continuous and an immediate seal is achieved.  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  Eschar is still firmly adherent in center. Left face:  1cm x 1.2cm black eschar is listing at edges. Wound bed:As described above Drainage (amount, consistency, odor) Small amount serous Periwound:intact Dressing procedure/placement/frequency: Neosporin ointment twice daily  continues with cover of silicone foam.  Followed by Plastics.  (Dr. Marla Roe)  [place] Nurse wound follow up Wound type: Buttock pressure injury Measurement: 5cm x 6.5cm x 0.1cm area of tissue loss with 70% pink tissue and 30% thin yellow slough Wound bed:As described above Drainage (amount, consistency, odor) scant serous  to light yellow consistent with debriding slough Periwound:intact  Dressing procedure/placement/frequency:Continue collagenase ointment until yellow slough disappears.  Continue turning and repositioning off of sacrum, mattress with low air loss feature  [facility] Nurse wound consult note Reason for Consult: medical adhesive related skin injury.moisture associated skin damage to inframammary tissue Wound type:moisture Pressure Injury POA: NA Measurement: Area of involvement measures 3cm x 9cm and has reepithelialized, but is macerated today Wound bed: recently reepithelialized tissue with white maceration surrounding. Drainage (amount, consistency, odor) scant serous Periwound:white macerated  Dressing procedure/placement/frequency:Area cleansed and dried, new thin film dressing applied.   [place] Nurse ostomy follow up Stoma type/location: RLQ end ileostomy Stomal assessment/size: 1 and 1/8 inches round, raised, red and moist.  Os at center. Peristomal assessment: Intact. Treatment options for stomal/peristomal skin: skin barrier ring and convex pouch Output: dark brown/green effluent.  271mls emptied prior to pouch change. Ostomy pouching: 1pc.convex ostomy pouching system with skin barrier ring.  Cut off-center and medial edge of paper tape trimmed.  VAC drape placed beneath belt tab. Skin barrier rings used to fill most distal part of wound stretched to also fill defect at umbilicus. Education provided: None Enrolled patient in [employer] Discharge program: No   WOC nursing team will follow, and will remain available to this patient, the nursing and medical teams.  Next planned visit on Wednesday, 11/05/17. Thanks, Maudie Flakes, MSN, RN, [place], Arther Abbott  Pager# [phone]

## 2017-11-04 ENCOUNTER — Inpatient Hospital Stay (HOSPITAL_COMMUNITY): Payer: Medicare Other

## 2017-11-04 LAB — BASIC METABOLIC PANEL
Anion gap: 7 (ref 5–15)
BUN: 39 mg/dL — AB (ref 6–20)
CALCIUM: 7.9 mg/dL — AB (ref 8.9–10.3)
CO2: 28 mmol/L (ref 22–32)
CREATININE: 1.21 mg/dL — AB (ref 0.44–1.00)
Chloride: 111 mmol/L (ref 101–111)
GFR calc Af Amer: 51 mL/min — ABNORMAL LOW (ref >60)
GFR calc non Af Amer: 44 mL/min — ABNORMAL LOW (ref >60)
Glucose, Bld: 156 mg/dL — ABNORMAL HIGH (ref 65–99)
Potassium: 3.3 mmol/L — ABNORMAL LOW (ref 3.5–5.1)
SODIUM: 146 mmol/L — AB (ref 135–145)

## 2017-11-04 LAB — AEROBIC/ANAEROBIC CULTURE W GRAM STAIN (SURGICAL/DEEP WOUND)

## 2017-11-04 LAB — GLUCOSE, CAPILLARY
GLUCOSE-CAPILLARY: 141 mg/dL — AB (ref 65–99)
GLUCOSE-CAPILLARY: 142 mg/dL — AB (ref 65–99)
GLUCOSE-CAPILLARY: 144 mg/dL — AB (ref 65–99)
Glucose-Capillary: 169 mg/dL — ABNORMAL HIGH (ref 65–99)
Glucose-Capillary: 120 mg/dL — ABNORMAL HIGH (ref 65–99)
Glucose-Capillary: 146 mg/dL — ABNORMAL HIGH (ref 65–99)

## 2017-11-04 LAB — MAGNESIUM: MAGNESIUM: 1.7 mg/dL (ref 1.7–2.4)

## 2017-11-04 LAB — CBC
HCT: 22.9 % — ABNORMAL LOW (ref 36.0–46.0)
Hemoglobin: 7.1 g/dL — ABNORMAL LOW (ref 12.0–15.0)
MCH: 28.3 pg (ref 26.0–34.0)
MCHC: 31 g/dL (ref 30.0–36.0)
MCV: 91.2 fL (ref 78.0–100.0)
Platelets: 283 10*3/uL (ref 150–400)
RBC: 2.51 MIL/uL — ABNORMAL LOW (ref 3.87–5.11)
RDW: 18.3 % — AB (ref 11.5–15.5)
WBC: 8.2 10*3/uL (ref 4.0–10.5)

## 2017-11-04 LAB — PROCALCITONIN: Procalcitonin: 0.48 ng/mL

## 2017-11-04 LAB — PHOSPHORUS: PHOSPHORUS: 3.8 mg/dL (ref 2.5–4.6)

## 2017-11-04 MED ORDER — LIP MEDEX EX OINT
TOPICAL_OINTMENT | CUTANEOUS | Status: AC
  Administered 2017-11-04: 06:00:00
  Filled 2017-11-04: qty 7

## 2017-11-04 MED ORDER — ADULT MULTIVITAMIN LIQUID CH
15.0000 mL | Freq: Every day | ORAL | Status: DC
  Administered 2017-11-04 – 2017-11-06 (×3): 15 mL
  Filled 2017-11-04 (×3): qty 15

## 2017-11-04 MED ORDER — ONDANSETRON HCL 4 MG/2ML IJ SOLN
4.0000 mg | Freq: Four times a day (QID) | INTRAMUSCULAR | Status: DC | PRN
  Administered 2017-11-05: 4 mg via INTRAVENOUS
  Filled 2017-11-04: qty 2

## 2017-11-04 MED ORDER — LOPERAMIDE HCL 1 MG/5ML PO LIQD
1.0000 mg | ORAL | Status: DC | PRN
  Filled 2017-11-04: qty 5

## 2017-11-04 MED ORDER — IPRATROPIUM-ALBUTEROL 0.5-2.5 (3) MG/3ML IN SOLN
3.0000 mL | Freq: Three times a day (TID) | RESPIRATORY_TRACT | Status: DC
  Administered 2017-11-05 – 2017-11-06 (×5): 3 mL via RESPIRATORY_TRACT
  Filled 2017-11-04 (×5): qty 3

## 2017-11-04 MED ORDER — PRO-STAT SUGAR FREE PO LIQD
30.0000 mL | Freq: Every day | ORAL | Status: DC
  Administered 2017-11-04 – 2017-11-06 (×3): 30 mL
  Filled 2017-11-04 (×4): qty 30

## 2017-11-04 NOTE — Progress Notes (Signed)
Nutrition Follow-up  DOCUMENTATION CODES:   Not applicable  INTERVENTION:  - Continue Osmolite 1.2 @ 60 mL/hr and add 30 mL Prostat once/day to provide an additional 100 kcal and 15 grams of protein. - Free water flush to continue to be per PCCM MD/NP.   NUTRITION DIAGNOSIS:   Inadequate oral intake related to inability to eat as evidenced by NPO status. -ongoing  GOAL:   Patient will meet greater than or equal to 90% of their needs -met with TF regimen  MONITOR:   TF tolerance, Weight trends, Labs, Skin  ASSESSMENT:   Pt with PMH of GERD, DM, HTN, PVD, HLD, HTN presents with abdominal pain found necrosis of proximal colon. Pt is s/p emergent ex lap with extended right colectomy and application of abdominal vacuum dressing on 10/14/17. Pt required intubation r/t post op respiratory failure.   SIGNIFICANT EVENTS: 1/1- exploratory laparotomy, right colectomy with wound vac placement 1/3- small bowel ischemia, resected ischemic ileum, wound vac replaced, TPN inititated 1/5- re-exploration of abdomen with end proximal ileostomy and closure of hernia 1/9- extubated and OGT removed 1/12 - trickle feeds started 1/13- abdominal abscess drained 1/15- tracheostomy, TF placed on hold, Panda tube placed 1/16- able to re-start TF 1/17- TPN stopped   Pt with Panda tube in place and receiving Osmolite 1.2 @ 60 mL/hr, 200 mL free water QID added on 11/01/17. This regimen is providing 1728 kcal, 80 grams of protein, and 1981 mL free water. Weight trending down, now below admission weight. Updated estimated nutrition needs based on current weight (75.8 kg) in light of prolonged need for ICU/SDU, bedbound with multiple wounds, and current medical course/diagnoses. Will adjust TF regimen as outlined above d/t this change.   Pt has been having some nausea this AM but denies emesis; no episodes of emesis documented in the flow sheet either. Surgery PA note from this AM states ~1L output from  ileostomy. A percutaneous drain was placed on 10/26/17 d/t abdominal abscess and drain was able to be removed 1/21 as CT from 1/18 showed near resolution of abscess. Dr. Bari Mantis note from yesterday state that once surgical issues are resolved, pt would be a good candidate for LTAC.   Medications reviewed; 20 mg IV Pepcid BID, sliding scale Novolog, daily multivitamin with minerals. Labs reviewed; CBG: 169 mg/dL this AM, Na: 146 mmol/L, K: 3.3 mmol/L, BUN: 39 mg/dL, creatinine: 1.21 mg/dL, Ca: 7.9 mg/dL, GFR: 44 mL/min.  IVF: D5 @ 100 mL/hr (408 kcal).       Diet Order:  Fall precautions  EDUCATION NEEDS:   No education needs have been identified at this time  Skin:  Skin Assessment: Skin Integrity Issues: Skin Integrity Issues:: Stage II, Unstageable Stage II: back and L upper face; R unspecified area Unstageable: unknown location Incisions: abdominal   Last BM:  1/21  Height:   Ht Readings from Last 1 Encounters:  11/03/17 '5\' 4"'  (1.626 m)    Weight:   Wt Readings from Last 1 Encounters:  11/04/17 167 lb 1.7 oz (75.8 kg)    Ideal Body Weight:  54.5 kg  BMI:  Body mass index is 28.68 kg/m.  Estimated Nutritional Needs:   Kcal:  3383-2919 (25-28 kcal/kg)  Protein:  91-114 grams (1.2-1.5 grams/kg)  Fluid:  >/= 2 L/day      Jarome Matin, MS, RD, LDN, [facility] Inpatient Clinical Dietitian Pager # [phone] After hours/weekend pager # [phone]

## 2017-11-04 NOTE — Progress Notes (Signed)
72 year old woman admitted 1/1 with ischemic colitis requiring a right hemicolectomy 1/1, further small bowel resection and subsequent ileostomy 1/5.  Course complicated by acute bilateral  watershed infarcts/CVA  and prolonged ventilation requiring tracheostomy She was weaned off the vent for 48 hours but resumed 1/20 and started on empiric antibiotics for fever and left lower lobe infiltrate presumed H CAP  She has tolerated trach collar all day on 1/21 Defervesced, more awake and follows one-step commands, soft nontender abdomen with midline wound VAC  Labs show hyponatremia and hypokalemia, creatinine improving, low pro-calcitonin with anemia  Impression/plan  Acute respiratory failure/prolonged ventilation requiring tracheostomy-continue trach collar with goal 24 hours  HCAP -respiratory culture negative, can discontinue vancomycin, continue Zosyn for 5-7 days depending on clinical progress, Flagyl added for clostridia in wound  Protein calorie malnutrition/acute mesenteric ischemia/ileostomy-wound care per surgery, continue tube feeds Abdominal abscess status post drain  Hyponatremia-on D5W, hypokalemia will be repleted  Try to take over care, P CCM to follow for vent needs as needed   Rakesh V. Elsworth Soho MD

## 2017-11-04 NOTE — Evaluation (Signed)
Physical Therapy Re-Evaluation Patient Details Name: [name] MRN: [id number] DOB: [birth year] Today's Date: 11/04/2017   History of Present Illness  72 yo female admitted 10/14/17 with worsening abdominal pain. S/P EXPLORATORY LAPAROTOMY, EXTENDED RIGHT COLECTOMY; APPLICATION OF ABDOMINAL VACUUM DRESSING , extubated 10/22/17.  Pt experienced CVA and was reinubated - Pt now extubated and on trach colllar  Clinical Impression  Pt admitted as above and presenting with functional mobility limitations 2* severe generalized weakness and lethargy.  At this time, pt occasionally following one step commands and able to participate only minimally in session.  Pt will benefit from acute stay PT for strengthening and future mobilization.       Follow Up Recommendations SNF    Equipment Recommendations       Recommendations for Other Services OT consult     Precautions / Restrictions Precautions Precautions: Fall Precaution Comments: abd VAC,, very weak Restrictions Weight Bearing Restrictions: No      Mobility  Bed Mobility               General bed mobility comments: NT 2* pts inability to follow and participate with basic ROM  Transfers                 General transfer comment: NT  Ambulation/Gait                Stairs            Wheelchair Mobility    Modified Rankin (Stroke Patients Only)       Balance                                             Pertinent Vitals/Pain Pain Assessment: Faces Faces Pain Scale: No hurt    Home Living Family/patient expects to be discharged to:: Private residence Living Arrangements: Children;Other relatives               Additional Comments: unsure at this time ,caregivers not available    Prior Function           Comments: unsure, came from home,     Hand Dominance        Extremity/Trunk Assessment   Upper Extremity Assessment Upper Extremity Assessment:  Generalized weakness RUE Deficits / Details: Squeezes fist on instruction but follows no other cues.   LUE Deficits / Details: Squeezes fist in response to cues but follows no other commands.  Observed to IND bring hand to chin several times unsolicited.    Lower Extremity Assessment Lower Extremity Assessment: Generalized weakness RLE Deficits / Details: Follows no cues to assist with any ROM LLE Deficits / Details: follows no cues to assist with any ROM       Communication   Communication: Tracheostomy  Cognition Arousal/Alertness: Lethargic Behavior During Therapy: Flat affect Overall Cognitive Status: Difficult to assess                                        General Comments      Exercises General Exercises - Upper Extremity Shoulder Flexion: PROM;Right;Left;10 reps;Supine Elbow Flexion: Both;PROM;10 reps;Supine Elbow Extension: PROM;Both;10 reps;Supine Digit Composite Flexion: AROM;Both;5 reps;Supine General Exercises - Lower Extremity Ankle Circles/Pumps: PROM;Both;10 reps Heel Slides: PROM;Both;10 reps Hip ABduction/ADduction: PROM;Both;10 reps;Supine   Assessment/Plan  PT Assessment Patient needs continued PT services  PT Problem List Decreased strength;Decreased activity tolerance;Decreased mobility;Decreased knowledge of precautions;Decreased safety awareness;Decreased knowledge of use of DME;Pain;Decreased balance;Cardiopulmonary status limiting activity;Decreased cognition       PT Treatment Interventions Functional mobility training;Therapeutic activities;Patient/family education;Therapeutic exercise;Balance training;Cognitive remediation    PT Goals (Current goals can be found in the Care Plan section)  Acute Rehab PT Goals Patient Stated Goal: No goals stated PT Goal Formulation: Patient unable to participate in goal setting Time For Goal Achievement: 11/17/17 Potential to Achieve Goals: Fair    Frequency Min 2X/week   Barriers to  discharge        Co-evaluation               AM-PAC PT "6 Clicks" Daily Activity  Outcome Measure Difficulty turning over in bed (including adjusting bedclothes, sheets and blankets)?: Unable Difficulty moving from lying on back to sitting on the side of the bed? : Unable Difficulty sitting down on and standing up from a chair with arms (e.g., wheelchair, bedside commode, etc,.)?: Unable Help needed moving to and from a bed to chair (including a wheelchair)?: Total Help needed walking in hospital room?: Total Help needed climbing 3-5 steps with a railing? : Total 6 Click Score: 6    End of Session   Activity Tolerance: Patient limited by fatigue Patient left: in bed Nurse Communication: Mobility status PT Visit Diagnosis: Muscle weakness (generalized) (M62.81)    Time: 8882-8003 PT Time Calculation (min) (ACUTE ONLY): 21 min   Charges:   PT Evaluation $PT Re-evaluation: 1 Re-eval     PT G Codes:        Pg [phone]   Arwa Yero 11/04/2017, 10:33 AM

## 2017-11-04 NOTE — Progress Notes (Signed)
Placed PT on 28% ATC at approximately 0820. Current Sp02 97, rr 15, hr 100. PT confirms she is breathing fine at this time. RN aware.

## 2017-11-04 NOTE — Progress Notes (Signed)
Patient ID: [name], female   DOB: 1946/08/21, 72 y.o.   MRN: [id number]    PROGRESS NOTE    TAKIYA BELMARES  ZYS:[id number] DOB: [birth year] DOA: 10/14/2017  PCP: Leanna Battles, MD   Brief Narrative:  72 year old woman admitted 1/1 with ischemic colitis requiring a right hemicolectomy 1/1, further small bowel resection and subsequent ileostomy 1/5. Course complicated by acute bilateral watershed infarcts/CVA and prolonged ventilation requiring tracheostomy. Pt was weaned off the vent for 48 hours but resumed on 01/20, started on empiric ABX for fever and LLL infiltrate for presumptive HCAP. Pt tolerated trach collar on 01/21.   SIGNIFICANT EVENTS: 1/01 ex lap, R colectomy 1/03 ex lap, partial omentectomy, resection of ischemic ileum (130cm)  1/05 Ex lap, abdominal wall closure 1/09 Extubation 1/11 Acute CVA, reintubated 1/13 Fever > CT abdomen/pelvis: abscess, Moving around well, BP labile.  IR perc drain. 1/14 Weaning on PSV 10/5, 30%, Tmax 101.1.  Pt in bigeminy (has been).  1/15 Trach  1/16 Tolerated ATC ~ 5 hours 1/17 ATC 28% for ~ 7 hours.  TPN stopped  1/18 Weaning on ATC 28%.  Bicarbonate stopped . ATC 28%.  Pending CT abd.     1/19 ATC. On fent gtt. Open drain with VAC being managed by CCS 1/20 Vent overnight after being on ATC 48h. T max 101. 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  Ileostomy 10/18/2017, VAC in place   Acute mesenteric ischemia, shock liver - Right colectomy - 1/1 (PT) - Small bowel resection - 1/3 (TG) - RE-EXPLORATION OF ABDOMEN, ILEOSTOMY CREATION - 10/18/2017 - [place] - fever on 01/12 and again on 01/19, suspected new infiltrate as noted below on 01/20, treating with ABX  - so far tolerating TF at 60 cc/hr - open wound with vac, continue VAC MWF    RLQ abscess - s/p perc drain 1/13 - s/p drainage of post op right abd  fluid collection 01/13 - drain fluid cx with few yeast/clostridium  - follow up CT with only small amount of residual fluid at drained site, output minimal  - drained removed 01/20    Acute on chronic  respiratory failure with hypoxemia - now with trach and also developed new HCAP on 01/20, ? 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 output - continue to trend BMP - supplement K and other electrolytes as needed    Mild anemia without bleeding - no evidence of active bleeding - CBC in AM  DVT prophylaxis: Heparin SQ Code Status: Full  Family Communication: Patient at bedside  Disposition Plan: to be determined   Consultants:   Surgery   IR  PCCM  STUDIES:   1/01 CT  abdomen/pelvis >> diffuse pneumatosis in cecum and ascending colon with distension, bibasilar airspace opacities, aortic calcification, non-obstructing left kidney stone  1/11 CT head >> NAICP  1/11 MRI/MRA brain: Acute cerebral infarction bilateral left greater than right, left parietal and left temporal lobe.  Small acute infarct right frontal parietal white hemorrhage, occluded left common carotid.  1/12 CT Ab/pelvis> loculated fluid collection along R abdominal wall; cannot exclude cirrhosis, liver lobe enlargement  CULTURES:  1/1 Blood > neg  1/1 urine > neg  1/7 blood > neg  1/7 cath tip > neg  1/11 blood > neg  1/13 Abdomen fluid >>  1/20 BCx2 >>   1/20  Sputum >>  ANTIBIOTICS:  Zosyn 12/31 > 1/9  Anidulafungin 1/4 >  1/9  Meropenem 1/11 > 1/20   Vanco 1/20 >>  Zosyn 1/20 >>   LINES/TUBES:  1/1 ETT > 1/9, 1/11 > 1/15  1/1 R IJ CVL > 1/8  1/7 PICC >>  Trach 1/15 >>  Subjective: No events overnight.   Objective: Vitals:   11/04/17 1320 11/04/17 1400 11/04/17 1500 11/04/17 1600  BP:  (!) 133/48 (!) 117/52   Pulse:  81 80   Resp:  17 15   Temp:    (!) 97.3 F (36.3 C)  TempSrc:    Axillary  SpO2: 100% 99% 100%   Weight:      Height:        Intake/Output Summary (Last 24 hours) at 11/04/2017 1720 Last data filed at 11/04/2017 1225 Gross per 24 hour  Intake 2400 ml  Output 1850 ml  Net 550 ml   Filed Weights   11/02/17 0307 11/03/17 0500 11/04/17 0500  Weight: 75.9 kg (167 lb 5.3 oz) 75.8 kg (167 lb 1.7 oz) 75.8 kg (167 lb 1.7 oz)    Examination:  General exam: Appears calm, trach in place Respiratory system: bilateral rhonchi with diminished air movement at bases  Cardiovascular system: S1 & S2 heard, RRR. No JVD, rubs, gallops or clicks.  Gastrointestinal system: Abdomen is nondistended, soft and nontender.  Central nervous system: Alert, follows commands, but difficult to understand as speech is very soft Extremities: edema in upper extremities   Data Reviewed: I have personally reviewed following labs and imaging studies  CBC: Recent Labs  Lab 10/31/17 0700 11/01/17 0308 11/02/17 1146 11/03/17 0541 11/04/17 0500  WBC 8.2 7.9 8.1 9.0 8.2  NEUTROABS  --   --  4.4  --   --   HGB 8.2* 8.4* 7.6* 7.6* 7.1*  HCT 24.8* 26.3* 23.7* 24.0* 22.9*  MCV 86.7 89.2 90.5 90.9 91.2  PLT 323 364 353 320 858   Basic Metabolic Panel: Recent Labs  Lab 10/29/17 0536  10/30/17 0500 10/31/17 0700 11/01/17 0308 11/02/17 1146 11/03/17 0541 11/04/17 0500  NA 142  --  144 149* 151* 148* 148* 146*  K 3.6  --  3.1* 3.6 4.0 3.8 3.7 3.3*  CL 120*  --  115* 114* 114* 114* 114* 111  CO2 18*  --  23 28 31 29 28 28     GLUCOSE 132*  --  149* 129* 173* 173* 148* 156*  BUN 62*  --  52* 44* 42* 49* 48* 39*  CREATININE 1.01*  --  0.95 0.78 0.96 1.34* 1.35* 1.21*  CALCIUM 8.3*  --  8.4* 8.3* 8.3* 8.1* 8.2* 7.9*  MG 1.9   < > 1.6* 1.9 1.7  --  2.0 1.7  PHOS 2.1*  --  2.5  --   --   --  3.7 3.8   < > = values in this interval not displayed.   Liver Function Tests: Recent Labs  Lab 10/29/17 0536 10/30/17 0500 11/02/17 1146  AST 131* 96* 143*  ALT 145* 124* 132*  ALKPHOS 148* 163* 253*  BILITOT 0.6 0.5 0.5  PROT 5.5* 5.9* 5.7*  ALBUMIN 1.7* 1.8* 1.9*   CBG: Recent Labs  Lab 11/03/17 1930 11/03/17 2307 11/04/17 0310 11/04/17 0753 11/04/17 1145  GLUCAP 119* 107* 169* 141* 120*   Urine analysis:    Component Value Date/Time   COLORURINE AMBER (A) 10/14/2017 0332   APPEARANCEUR CLEAR 10/14/2017 0332   LABSPEC 1.016 10/14/2017 0332   PHURINE 6.0 10/14/2017 0332   GLUCOSEU 50 (A) 10/14/2017 0332   HGBUR SMALL (A) 10/14/2017 0332   BILIRUBINUR NEGATIVE 10/14/2017 0332   KETONESUR NEGATIVE 10/14/2017 0332   PROTEINUR 100 (A) 10/14/2017 0332   NITRITE NEGATIVE 10/14/2017 0332   LEUKOCYTESUR NEGATIVE 10/14/2017 0332   Recent Results (from the past 240 hour(s))  Aerobic/Anaerobic Culture (surgical/deep wound)     Status: None   Collection Time: 10/26/17  1:05 PM  Result Value Ref Range Status   Specimen Description ABDOMEN  Final   Special Requests NONE  Final   Gram Stain   Final    ABUNDANT WBC PRESENT, PREDOMINANTLY PMN NO ORGANISMS SEEN Performed at [facility], [address], [place], [place] [address]    Culture   Final    FEW CANDIDA GLABRATA FEW CLOSTRIDIUM CLOSTRIDIOFORME    Report Status 11/04/2017 FINAL  Final  Culture, respiratory (NON-Expectorated)     Status: None (Preliminary result)   Collection Time: 11/02/17 11:46 AM  Result Value Ref Range Status   Specimen Description TRACHEAL ASPIRATE  Final   Special Requests Immunocompromised  Final   Gram Stain    Final    ABUNDANT WBC PRESENT, PREDOMINANTLY PMN RARE SQUAMOUS EPITHELIAL CELLS PRESENT FEW BUDDING YEAST SEEN    Culture   Final    CULTURE REINCUBATED FOR BETTER GROWTH Performed at [facility], [place] [address], [place], [place] [address]    Report Status PENDING  Incomplete  Culture, blood (Routine X 2) w Reflex to ID Panel     Status: None (Preliminary result)   Collection Time: 11/02/17 12:15 PM  Result Value Ref Range Status   Specimen Description BLOOD LEFT HAND  Final   Special Requests   Final    BOTTLES DRAWN AEROBIC AND ANAEROBIC Blood Culture adequate volume   Culture   Final    NO GROWTH 1 DAY Performed at [facility], [place] [address] [place], [place] [address]    Report Status PENDING  Incomplete  Culture, blood (Routine X 2) w Reflex to ID Panel     Status: None (Preliminary result)   Collection Time: 11/02/17 12:41 PM  Result Value Ref Range Status   Specimen Description BLOOD LEFT HAND  Final   Special Requests IN PEDIATRIC BOTTLE Blood Culture adequate volume  Final   Culture   Final    NO GROWTH 1 DAY Performed at [facility], [place] [address], [place], [place] [address]    Report Status PENDING  Incomplete    Radiology Studies: Dg Chest Port 1 View  Result Date: 11/04/2017 CLINICAL DATA:  Respiratory failure. EXAM: PORTABLE CHEST 1 VIEW COMPARISON:  11/02/2017. FINDINGS: Tracheostomy tube and NG tube in stable position. Right PICC line noted with tip over superior vena cava. Heart size normal. Diffuse bilateral  pulmonary infiltrates are noted. No pleural effusion. No pneumothorax. IMPRESSION: 1.  Lines and tubes in stable position. 2.  Bibasilar atelectasis/infiltrates noted on today's exam. Electronically Signed   By: Marcello Moores  Register   On: 11/04/2017 07:43   Dg Abd Portable 1v  Result Date: 11/03/2017 CLINICAL DATA:  Nasogastric tube placement EXAM: PORTABLE ABDOMEN - 1 VIEW COMPARISON:  Portable exam 1654 hours compared to 10/29/2017  FINDINGS: Patient slightly rotated to the RIGHT. Tip of nasogastric tube is in the RIGHT mid abdomen projecting over pylorus or distal gastric antrum. Nonobstructive bowel gas pattern. Catheter projects over pelvis question wound VAC. Bones demineralized. Scattered atherosclerotic calcifications. IMPRESSION: Tip of nasogastric tube projects over distal gastric antrum or pylorus. Electronically Signed   By: Lavonia Dana M.D.   On: 11/03/2017 17:20    Scheduled Meds: .  stroke: mapping our early stages of recovery book   Does not apply Once  . amLODipine  10 mg Oral Daily  . aspirin  81 mg Oral Daily  . chlorhexidine  15 mL Mouth Rinse BID  . Chlorhexidine Gluconate Cloth  6 each Topical Daily  . collagenase   Topical Daily  . feeding supplement (PRO-STAT SUGAR FREE 64)  30 mL Per Tube Daily  . free water  200 mL Per Tube Q6H  . heparin injection (subcutaneous)  5,000 Units Subcutaneous Q8H  . insulin aspart  0-15 Units Subcutaneous Q4H  . ipratropium-albuterol  3 mL Nebulization Q6H  . isosorbide mononitrate  30 mg Per Tube BID  . mouth rinse  15 mL Mouth Rinse 10 times per day  . metoprolol tartrate  50 mg Per Tube BID  . multivitamin  15 mL Per Tube Daily  . neomycin-bacitracin-polymyxin   Topical Daily  . sodium chloride flush  5 mL Intravenous Q8H  . venlafaxine  75 mg Per Tube BID WC   Continuous Infusions: . dextrose 100 mL/hr at 11/04/17 1719  . famotidine (PEPCID) IV Stopped (11/04/17 1044)  . feeding supplement (OSMOLITE 1.2 CAL) 1,000 mL (11/02/17 0700)  . metronidazole 500 mg (11/04/17 1720)  . piperacillin-tazobactam (ZOSYN)  IV Stopped (11/04/17 1625)     LOS: 21 days   Time spent: 35 minutes   Faye Ramsay, MD [facility] Pager [phone]  If 7PM-7AM, please contact night-coverage [email or web address] Password [email or web address] 11/04/2017, 5:20 PM

## 2017-11-04 NOTE — Progress Notes (Signed)
Placed PT on new ATC set up (28%- 5 LPM).

## 2017-11-04 NOTE — Progress Notes (Signed)
[facility] Progress Note  17 Days Post-Op  Subjective: CC: no new complaints Patient denies abdominal pain. Some nausea, no vomiting. Tolerating TF. Having stool output via ileostomy, slightly over 1L.   Objective: Vital signs in last 24 hours: Temp:  [98.6 F (37 C)-99.8 F (37.7 C)] 98.9 F (37.2 C) (01/22 0800) Pulse Rate:  [100-117] 102 (01/22 0400) Resp:  [15-26] 15 (01/22 0400) BP: (132-196)/(55-83) 183/68 (01/22 0400) SpO2:  [93 %-100 %] 97 % (01/22 0820) FiO2 (%):  [28 %-30 %] 28 % (01/22 0820) Weight:  [75.8 kg (167 lb 1.7 oz)] 75.8 kg (167 lb 1.7 oz) (01/22 0500) Last BM Date: 11/03/17  Intake/Output from previous day: 01/21 0701 - 01/22 0700 In: 2600 [I.V.:2300; IV Piggyback:300] Out: 2740 [Urine:1480; Stool:1260] Intake/Output this shift: No intake/output data recorded.  PE: Gen:  Alert, NAD, pleasant Card:  Regular rate and rhythm, pedal pulses 2+ BL Pulm:  Normal effort, clear to auscultation bilaterally Abd: Soft, non-tender, non-distended, bowel sounds present, VAC to midline wound, stoma pink with liquid stool in ileostomy pouch  Skin: warm and dry, no rashes  Psych: A&Ox3   Lab Results:  Recent Labs    11/03/17 0541 11/04/17 0500  WBC 9.0 8.2  HGB 7.6* 7.1*  HCT 24.0* 22.9*  PLT 320 283   BMET Recent Labs    11/03/17 0541 11/04/17 0500  NA 148* 146*  K 3.7 3.3*  CL 114* 111  CO2 28 28  GLUCOSE 148* 156*  BUN 48* 39*  CREATININE 1.35* 1.21*  CALCIUM 8.2* 7.9*   PT/INR No results for input(s): LABPROT, INR in the last 72 hours. CMP     Component Value Date/Time   NA 146 (H) 11/04/2017 0500   K 3.3 (L) 11/04/2017 0500   CL 111 11/04/2017 0500   CO2 28 11/04/2017 0500   GLUCOSE 156 (H) 11/04/2017 0500   BUN 39 (H) 11/04/2017 0500   CREATININE 1.21 (H) 11/04/2017 0500   CALCIUM 7.9 (L) 11/04/2017 0500   PROT 5.7 (L) 11/02/2017 1146   ALBUMIN 1.9 (L) 11/02/2017 1146   AST 143 (H) 11/02/2017 1146   ALT 132 (H)  11/02/2017 1146   ALKPHOS 253 (H) 11/02/2017 1146   BILITOT 0.5 11/02/2017 1146   GFRNONAA 44 (L) 11/04/2017 0500   GFRAA 51 (L) 11/04/2017 0500   Lipase     Component Value Date/Time   LIPASE 17 10/14/2017 0329       Studies/Results: Dg Chest Port 1 View  Result Date: 11/04/2017 CLINICAL DATA:  Respiratory failure. EXAM: PORTABLE CHEST 1 VIEW COMPARISON:  11/02/2017. FINDINGS: Tracheostomy tube and NG tube in stable position. Right PICC line noted with tip over superior vena cava. Heart size normal. Diffuse bilateral pulmonary infiltrates are noted. No pleural effusion. No pneumothorax. IMPRESSION: 1.  Lines and tubes in stable position. 2.  Bibasilar atelectasis/infiltrates noted on today's exam. Electronically Signed   By: Marcello Moores  Register   On: 11/04/2017 07:43   Dg Abd Portable 1v  Result Date: 11/03/2017 CLINICAL DATA:  Nasogastric tube placement EXAM: PORTABLE ABDOMEN - 1 VIEW COMPARISON:  Portable exam 1654 hours compared to 10/29/2017 FINDINGS: Patient slightly rotated to the RIGHT. Tip of nasogastric tube is in the RIGHT mid abdomen projecting over pylorus or distal gastric antrum. Nonobstructive bowel gas pattern. Catheter projects over pelvis question wound VAC. Bones demineralized. Scattered atherosclerotic calcifications. IMPRESSION: Tip of nasogastric tube projects over distal gastric antrum or pylorus. Electronically Signed   By: Crist Infante.D.  On: 11/03/2017 17:20    Anti-infectives: Anti-infectives (From admission, onward)   Start     Dose/Rate Route Frequency Ordered Stop   11/03/17 1800  metroNIDAZOLE (FLAGYL) IVPB 500 mg     500 mg 100 mL/hr over 60 Minutes Intravenous Every 8 hours 11/03/17 1714     11/03/17 1400  vancomycin (VANCOCIN) IVPB 1000 mg/200 mL premix  Status:  Discontinued     1,000 mg 200 mL/hr over 60 Minutes Intravenous Every 24 hours 11/02/17 1214 11/03/17 1657   11/02/17 1300  vancomycin (VANCOCIN) 1,500 mg in sodium chloride 0.9 % 500 mL  IVPB     1,500 mg 250 mL/hr over 120 Minutes Intravenous  Once 11/02/17 1214 11/02/17 1456   11/02/17 1215  piperacillin-tazobactam (ZOSYN) IVPB 3.375 g     3.375 g 12.5 mL/hr over 240 Minutes Intravenous Every 8 hours 11/02/17 1211     10/29/17 2200  meropenem (MERREM) 1 g in sodium chloride 0.9 % 100 mL IVPB  Status:  Discontinued     1 g 200 mL/hr over 30 Minutes Intravenous Every 8 hours 10/29/17 1057 11/02/17 1019   10/25/17 1000  meropenem (MERREM) 1 g in sodium chloride 0.9 % 100 mL IVPB  Status:  Discontinued     1 g 200 mL/hr over 30 Minutes Intravenous Every 12 hours 10/24/17 1547 10/29/17 1057   10/24/17 1600  meropenem (MERREM) 2 g in sodium chloride 0.9 % 100 mL IVPB     2 g 200 mL/hr over 30 Minutes Intravenous  Once 10/24/17 1547 10/24/17 1647   10/17/17 1200  anidulafungin (ERAXIS) 100 mg in sodium chloride 0.9 % 100 mL IVPB     100 mg 78 mL/hr over 100 Minutes Intravenous Every 24 hours 10/16/17 1110 10/22/17 1423   10/16/17 1200  anidulafungin (ERAXIS) 200 mg in sodium chloride 0.9 % 200 mL IVPB     200 mg 78 mL/hr over 200 Minutes Intravenous  Once 10/16/17 1110 10/16/17 1659   10/16/17 0200  piperacillin-tazobactam (ZOSYN) IVPB 3.375 g     3.375 g 12.5 mL/hr over 240 Minutes Intravenous Every 8 hours 10/15/17 2030 10/22/17 2121   10/15/17 1000  piperacillin-tazobactam (ZOSYN) IVPB 2.25 g  Status:  Discontinued     2.25 g 100 mL/hr over 30 Minutes Intravenous Every 8 hours 10/15/17 0823 10/15/17 2028   10/14/17 1000  piperacillin-tazobactam (ZOSYN) IVPB 3.375 g  Status:  Discontinued     3.375 g 12.5 mL/hr over 240 Minutes Intravenous Every 8 hours 10/14/17 0805 10/15/17 0823   10/14/17 0315  piperacillin-tazobactam (ZOSYN) IVPB 3.375 g     3.375 g 100 mL/hr over 30 Minutes Intravenous  Once 10/14/17 0302 10/14/17 0440   10/14/17 0315  vancomycin (VANCOCIN) IVPB 1000 mg/200 mL premix     1,000 mg 200 mL/hr over 60 Minutes Intravenous  Once 10/14/17 0303 10/14/17  0440       Assessment/Plan Essential hypertension GERD Asthma Fatty liver Obesity Type 2 diabetes mellitus  Tobacco use Acute respiratory failure with hypoxemia - on the vent, s/p trach 1/15 AKI (acute kidney injury) - Cr 1.21 Anemia - Hgb 7.1, continue to follow  Pressure injury of skin - Dr. Marla Roe  CVA vs severe deconditioning - neurology signed off 1/13, CT 1/11 no acute abnormality,                             PT/OT   Acute mesenteric ischemia - Right colectomy -  1/1 (PT) - Small bowel resection - 1/3 (TG) - RE-EXPLORATION OF ABDOMEN, ILEOSTOMY CREATION - 10/18/2017 - Byerly - tolerating TF at 60 cc/h - open wound with VAC, continue VAC MWF RLQ abscess - s/p perc drain 1/13 - CT 1/18 shows near resolution  - drain removed yesterday Fevers - pt also tachy, WBC 8.2 - likely VAP, per CCM  FEN: TF; will add prn loperamide for stool output over 1L; add prn zofran for nausea VTE: SQ heparin  ID: IV vanc/zosyn 1/20>> for VAP    LOS: 21 days    Brigid Re , [facility] 11/04/2017, 9:02 AM Pager: [phone] Consults: [phone] Mon-Fri 7:00 am-4:30 pm Sat-Sun 7:00 am-11:30 am

## 2017-11-05 LAB — GLUCOSE, CAPILLARY
GLUCOSE-CAPILLARY: 166 mg/dL — AB (ref 65–99)
GLUCOSE-CAPILLARY: 133 mg/dL — AB (ref 65–99)
Glucose-Capillary: 122 mg/dL — ABNORMAL HIGH (ref 65–99)
Glucose-Capillary: 115 mg/dL — ABNORMAL HIGH (ref 65–99)
Glucose-Capillary: 166 mg/dL — ABNORMAL HIGH (ref 65–99)
Glucose-Capillary: 116 mg/dL — ABNORMAL HIGH (ref 65–99)

## 2017-11-05 LAB — COMPREHENSIVE METABOLIC PANEL
ALK PHOS: 170 U/L — AB (ref 38–126)
ALT: 70 U/L — AB (ref 14–54)
ANION GAP: 8 (ref 5–15)
AST: 47 U/L — ABNORMAL HIGH (ref 15–41)
Albumin: 1.9 g/dL — ABNORMAL LOW (ref 3.5–5.0)
BILIRUBIN TOTAL: 0.3 mg/dL (ref 0.3–1.2)
BUN: 32 mg/dL — ABNORMAL HIGH (ref 6–20)
CALCIUM: 7.8 mg/dL — AB (ref 8.9–10.3)
CO2: 29 mmol/L (ref 22–32)
CREATININE: 0.88 mg/dL (ref 0.44–1.00)
Chloride: 102 mmol/L (ref 101–111)
Glucose, Bld: 118 mg/dL — ABNORMAL HIGH (ref 65–99)
Potassium: 2.7 mmol/L — CL (ref 3.5–5.1)
SODIUM: 139 mmol/L (ref 135–145)
TOTAL PROTEIN: 5.2 g/dL — AB (ref 6.5–8.1)

## 2017-11-05 LAB — CBC
HEMATOCRIT: 22.8 % — AB (ref 36.0–46.0)
HEMOGLOBIN: 7.3 g/dL — AB (ref 12.0–15.0)
MCH: 29 pg (ref 26.0–34.0)
MCHC: 32 g/dL (ref 30.0–36.0)
MCV: 90.5 fL (ref 78.0–100.0)
Platelets: 269 10*3/uL (ref 150–400)
RBC: 2.52 MIL/uL — AB (ref 3.87–5.11)
RDW: 17.3 % — ABNORMAL HIGH (ref 11.5–15.5)
WBC: 8.5 10*3/uL (ref 4.0–10.5)

## 2017-11-05 MED ORDER — MAGNESIUM SULFATE 2 GM/50ML IV SOLN
2.0000 g | Freq: Once | INTRAVENOUS | Status: AC
  Administered 2017-11-05: 2 g via INTRAVENOUS
  Filled 2017-11-05: qty 50

## 2017-11-05 MED ORDER — POTASSIUM CHLORIDE 20 MEQ/15ML (10%) PO SOLN
30.0000 meq | ORAL | Status: AC
  Administered 2017-11-05 (×2): 30 meq via ORAL
  Filled 2017-11-05 (×2): qty 30

## 2017-11-05 MED ORDER — LOPERAMIDE HCL 1 MG/5ML PO LIQD
2.0000 mg | Freq: Two times a day (BID) | ORAL | Status: DC
  Administered 2017-11-05 (×2): 2 mg
  Filled 2017-11-05 (×3): qty 10

## 2017-11-05 NOTE — Progress Notes (Signed)
Hospitalist App notified of k of 2.7. Recommended repeat mag level.

## 2017-11-05 NOTE — Progress Notes (Addendum)
Patient ID: [name], female   DOB: 1946-04-24, 72 y.o.   MRN: [id number]    PROGRESS NOTE  BARRIE SIGMUND  YQM:[id number] DOB: 04/02/1946 DOA: 10/14/2017  PCP: Leanna Battles, MD   Brief Narrative:  72 year old woman admitted 1/1 with ischemic colitis requiring a right hemicolectomy 1/1, further small bowel resection and subsequent ileostomy 1/5. Course complicated by acute bilateral watershed infarcts/CVA and prolonged ventilation requiring tracheostomy. Pt was weaned off the vent for 48 hours but resumed on 01/20, started on empiric ABX for fever and LLL infiltrate for presumptive HCAP. Pt tolerated trach collar on 01/21.   SIGNIFICANT EVENTS: 1/01 ex lap, R colectomy 1/03 ex lap, partial omentectomy, resection of ischemic ileum (130cm)  1/05 Ex lap, abdominal wall closure 1/09 Extubation 1/11 Acute CVA, reintubated 1/13 Fever > CT abdomen/pelvis: abscess, Moving around well, BP labile.  IR perc drain. 1/14 Weaning on PSV 10/5, 30%, Tmax 101.1.  Pt in bigeminy (has been).  1/15 Trach  1/16 Tolerated ATC ~ 5 hours 1/17 ATC 28% for ~ 7 hours.  TPN stopped  1/18 Weaning on ATC 28%.  Bicarbonate stopped . ATC 28%.  Pending CT abd.     1/19 ATC. On fent gtt. Open drain with VAC being managed by CCS 1/20 Vent overnight after being on ATC 48h. T max 101. 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  Ileostomy 10/18/2017, VAC in place   Acute mesenteric ischemia, shock liver - Right colectomy - 1/1 (PT) - Small bowel resection - 1/3 (TG) - RE-EXPLORATION OF ABDOMEN, ILEOSTOMY CREATION - 10/18/2017 - Byerly - fever on 01/12 and again on 01/19, suspected new infiltrate as noted below on 01/20, treating with ABX currently on Zosyn day #4/7 - so far tolerating TF at 60 cc/hr - open wound with vac, continue VAC MWF - pt asking when she can eat, will defer to surgery team       RLQ abscess - s/p perc drain 1/13 - s/p drainage of post op right abd fluid collection 01/13 - drain fluid cx with few yeast/clostridium  - follow up CT with only small amount of residual fluid at drained site, output minimal  - drained removed 01/20    Acute on chronic  respiratory failure with hypoxemia - now with trach and also developed new HCAP on 01/20, ? VAP vs atelectasis  - trach care per protocol - PCCM following     Septic shock  - due to the above - resolved     Acute metabolic encephalopathy, Acute bilateral CVA - felt to be due to hypotension / watershed in setting of septic shock on presentation - symptoms waxing and waning  - neurology team has signed off on 01/13 - mental status remains stable this AM     HTN, CAD - no anginal concerns - reasonable inpatient control  - continue Norvasc, Imdur 30 mg BID, Metoprolol 50 mg BID  - also on aspirin per home medical regimen     Severe PCM - on TF for now - pt asking when she can eat, will defer to surgery team to recommend     AKI - resolved 10/30/17   NGMA - ? ileostomy output - continue to trend BMP - K is still low so will continue to supplement - Mg on low end of normal, will given additional mg 2 gm IV -  check BMP and Mg in AM    Mild anemia without bleeding - no evidence of active bleeding - Hg overall stable - CBC in AM  DVT prophylaxis: Heparin SQ Code Status: Full  Family Communication: pt at bedside  Disposition Plan: to be determined   Consultants:   Surgery   IR  PCCM  STUDIES:   1/01 CT abdomen/pelvis >> diffuse pneumatosis in cecum and ascending colon with distension, bibasilar airspace opacities, aortic calcification, non-obstructing left kidney stone  1/11 CT head >> NAICP  1/11 MRI/MRA brain: Acute cerebral infarction bilateral left greater than right, left parietal and left temporal lobe.  Small acute infarct right frontal parietal white hemorrhage, occluded left common  carotid.  1/12 CT Ab/pelvis> loculated fluid collection along R abdominal wall; cannot exclude cirrhosis, liver lobe enlargement  CULTURES:  1/1 Blood > neg  1/1 urine > neg  1/7 blood > neg  1/7 cath tip > neg  1/11 blood > neg  1/13 Abdomen fluid >>  1/20 BCx2 >>   1/20 Sputum >>  ANTIBIOTICS:  Zosyn 12/31 > 1/9  Anidulafungin 1/4 >  1/9  Meropenem 1/11 > 1/20   Vanco 1/20 >>   Zosyn 1/20 >>   LINES/TUBES:  1/1 ETT > 1/9, 1/11 > 1/15  1/1 R IJ CVL > 1/8  1/7 PICC >>  Trach 1/15 >>  Subjective: No events overnight.   Objective: Vitals:   11/05/17 0440 11/05/17 0800 11/05/17 0900 11/05/17 1000  BP: (!) 130/45  (!) 125/45   Pulse: 82  76   Resp: 16     Temp:  98.4 F (36.9 C)    TempSrc:  Oral    SpO2: 100%  100% 100%  Weight:      Height:        Intake/Output Summary (Last 24 hours) at 11/05/2017 1351 Last data filed at 11/05/2017 1302 Gross per 24 hour  Intake 1560 ml  Output 4106 ml  Net -2546 ml   Filed Weights   11/02/17 0307 11/03/17 0500 11/04/17 0500  Weight: 75.9 kg (167 lb 5.3 oz) 75.8 kg (167 lb 1.7 oz) 75.8 kg (167 lb 1.7 oz)   Physical Exam  Constitutional: Appears calm, NAD, trach in place  CVS: RRR, S1/S2 +, no murmurs, no gallops, no carotid bruit.  Pulmonary: Effort and breath sounds normal, scattered rhonchi bilaterally  Abdominal: Soft. BS +,  no distension, tenderness, rebound or guarding.  Neuro: Alert. Normal reflexes, muscle tone coordination. No cranial nerve deficit.  Data Reviewed: I have personally reviewed following labs and imaging studies  CBC: Recent Labs  Lab 11/01/17 0308 11/02/17 1146 11/03/17 0541 11/04/17 0500 11/05/17 0500  WBC 7.9 8.1 9.0 8.2 8.5  NEUTROABS  --  4.4  --   --   --   HGB 8.4* 7.6* 7.6* 7.1* 7.3*  HCT 26.3* 23.7* 24.0* 22.9* 22.8*  MCV 89.2 90.5 90.9 91.2 90.5  PLT 364 353 320 283 678   Basic Metabolic Panel: Recent Labs  Lab 10/30/17 0500 10/31/17 0700 11/01/17 0308  11/02/17 1146 11/03/17 0541 11/04/17 0500 11/05/17 0500  NA 144 149* 151* 148* 148* 146* 139  K 3.1* 3.6 4.0 3.8 3.7 3.3* 2.7*  CL 115* 114* 114* 114* 114* 111 102  CO2 23 28 31 29 28 28 29   GLUCOSE 149* 129* 173* 173* 148* 156* 118*  BUN 52* 44* 42* 49* 48* 39* 32*  CREATININE 0.95 0.78 0.96 1.34* 1.35* 1.21* 0.88  CALCIUM 8.4* 8.3* 8.3*  8.1* 8.2* 7.9* 7.8*  MG 1.6* 1.9 1.7  --  2.0 1.7  --   PHOS 2.5  --   --   --  3.7 3.8  --    Liver Function Tests: Recent Labs  Lab 10/30/17 0500 11/02/17 1146 11/05/17 0500  AST 96* 143* 47*  ALT 124* 132* 70*  ALKPHOS 163* 253* 170*  BILITOT 0.5 0.5 0.3  PROT 5.9* 5.7* 5.2*  ALBUMIN 1.8* 1.9* 1.9*   CBG: Recent Labs  Lab 11/04/17 1946 11/04/17 2312 11/05/17 0313 11/05/17 0809 11/05/17 1256  GLUCAP 144* 146* 122* 115* 166*   Urine analysis:    Component Value Date/Time   COLORURINE AMBER (A) 10/14/2017 0332   APPEARANCEUR CLEAR 10/14/2017 0332   LABSPEC 1.016 10/14/2017 0332   PHURINE 6.0 10/14/2017 0332   GLUCOSEU 50 (A) 10/14/2017 0332   HGBUR SMALL (A) 10/14/2017 0332   BILIRUBINUR NEGATIVE 10/14/2017 0332   KETONESUR NEGATIVE 10/14/2017 0332   PROTEINUR 100 (A) 10/14/2017 0332   NITRITE NEGATIVE 10/14/2017 0332   LEUKOCYTESUR NEGATIVE 10/14/2017 0332   Recent Results (from the past 240 hour(s))  Culture, respiratory (NON-Expectorated)     Status: None   Collection Time: 11/02/17 11:46 AM  Result Value Ref Range Status   Specimen Description TRACHEAL ASPIRATE  Final   Special Requests Immunocompromised  Final   Gram Stain   Final    ABUNDANT WBC PRESENT, PREDOMINANTLY PMN RARE SQUAMOUS EPITHELIAL CELLS PRESENT FEW BUDDING YEAST SEEN Performed at [facility], [place] [address] [place], [place] [address]    Culture FEW CANDIDA ALBICANS MODERATE CANDIDA GLABRATA   Final   Report Status 11/05/2017 FINAL  Final  Culture, blood (Routine X 2) w Reflex to ID Panel     Status: None (Preliminary result)    Collection Time: 11/02/17 12:15 PM  Result Value Ref Range Status   Specimen Description BLOOD LEFT HAND  Final   Special Requests   Final    BOTTLES DRAWN AEROBIC AND ANAEROBIC Blood Culture adequate volume   Culture   Final    NO GROWTH 2 DAYS Performed at [facility], [place] [address], [place], [place] [address]    Report Status PENDING  Incomplete  Culture, blood (Routine X 2) w Reflex to ID Panel     Status: None (Preliminary result)   Collection Time: 11/02/17 12:41 PM  Result Value Ref Range Status   Specimen Description BLOOD LEFT HAND  Final   Special Requests IN PEDIATRIC BOTTLE Blood Culture adequate volume  Final   Culture   Final    NO GROWTH 2 DAYS Performed at [facility], [place] [address], [place], [place] [address]    Report Status PENDING  Incomplete    Radiology Studies: Dg Chest Port 1 View Result Date: 11/04/2017 Lines and tubes in stable position. 2.  Bibasilar atelectasis/infiltrates noted on today's exam.   Dg Abd Portable 1v Result Date: 11/03/2017 Tip of nasogastric tube projects over distal gastric antrum or pylorus.   Scheduled Meds: .  stroke: mapping our early stages of recovery book   Does not apply Once  . amLODipine  10 mg Oral Daily  . aspirin  81 mg Oral Daily  . chlorhexidine  15 mL Mouth Rinse BID  . Chlorhexidine Gluconate Cloth  6 each Topical Daily  . collagenase   Topical Daily  . feeding supplement (PRO-STAT SUGAR FREE 64)  30 mL Per Tube Daily  . free water  200  mL Per Tube Q6H  . heparin injection (subcutaneous)  5,000 Units Subcutaneous Q8H  . insulin aspart  0-15 Units Subcutaneous Q4H  . ipratropium-albuterol  3 mL Nebulization TID  . isosorbide mononitrate  30 mg Per Tube BID  . loperamide  2 mg Per Tube BID  . mouth rinse  15 mL Mouth Rinse 10 times per day  . metoprolol tartrate  50 mg Per Tube BID  . multivitamin  15 mL Per Tube Daily  . neomycin-bacitracin-polymyxin   Topical Daily  . sodium chloride flush  5  mL Intravenous Q8H  . venlafaxine  75 mg Per Tube BID WC   Continuous Infusions: . dextrose 100 mL/hr at 11/04/17 1719  . famotidine (PEPCID) IV Stopped (11/05/17 1031)  . feeding supplement (OSMOLITE 1.2 CAL) 1,000 mL (11/05/17 1249)  . metronidazole Stopped (11/05/17 1120)  . piperacillin-tazobactam (ZOSYN)  IV 3.375 g (11/05/17 1137)     LOS: 22 days   Time spent: 35 minutes   Faye Ramsay, MD [facility] Pager [phone]  If 7PM-7AM, please contact night-coverage [email or web address] Password [email or web address] 11/05/2017, 1:51 PM

## 2017-11-05 NOTE — Progress Notes (Signed)
[facility] Progress Note  18 Days Post-Op  Subjective: CC: Patient wanting to know when she may be able to eat Discussed with patient that when she will be able to eat will have to do with being able to wean off the vent and SLP will have to see to start a diet, but that we are making sure she gets some nutrition with TF. Patient denies abdominal pain and nausea this AM. Having >1.5 L output from ileostomy.  UOP good. VSS.  Objective: Vital signs in last 24 hours: Temp:  [97.3 F (36.3 C)-98.3 F (36.8 C)] 97.9 F (36.6 C) (01/23 0401) Pulse Rate:  [74-99] 82 (01/23 0440) Resp:  [9-17] 16 (01/23 0440) BP: (100-169)/(44-134) 130/45 (01/23 0440) SpO2:  [95 %-100 %] 100 % (01/23 0440) FiO2 (%):  [28 %] 28 % (01/23 0440) Last BM Date: 11/03/17  Intake/Output from previous day: 01/22 0701 - 01/23 0700 In: 3510 [I.V.:2810; NG/GT:400; IV Piggyback:300] Out: 3576 [Urine:1225; Drains:700; Stool:1651] Intake/Output this shift: Total I/O In: -  Out: 350 [Stool:350]  PE: Gen:  Alert, NAD, pleasant Card:  Regular rate and rhythm, pedal pulses 2+ BL Pulm:  Normal effort, clear to auscultation bilaterally Abd: Soft, non-tender, non-distended, bowel sounds present, VAC to midline wound, stoma pink with liquid stool in ileostomy pouch  Skin: warm and dry, no rashes; forehead wound Psych: A&Ox3     Lab Results:  Recent Labs    11/04/17 0500 11/05/17 0500  WBC 8.2 8.5  HGB 7.1* 7.3*  HCT 22.9* 22.8*  PLT 283 269   BMET Recent Labs    11/04/17 0500 11/05/17 0500  NA 146* 139  K 3.3* 2.7*  CL 111 102  CO2 28 29  GLUCOSE 156* 118*  BUN 39* 32*  CREATININE 1.21* 0.88  CALCIUM 7.9* 7.8*   PT/INR No results for input(s): LABPROT, INR in the last 72 hours. CMP     Component Value Date/Time   NA 139 11/05/2017 0500   K 2.7 (LL) 11/05/2017 0500   CL 102 11/05/2017 0500   CO2 29 11/05/2017 0500   GLUCOSE 118 (H) 11/05/2017 0500   BUN 32 (H) 11/05/2017 0500    CREATININE 0.88 11/05/2017 0500   CALCIUM 7.8 (L) 11/05/2017 0500   PROT 5.2 (L) 11/05/2017 0500   ALBUMIN 1.9 (L) 11/05/2017 0500   AST 47 (H) 11/05/2017 0500   ALT 70 (H) 11/05/2017 0500   ALKPHOS 170 (H) 11/05/2017 0500   BILITOT 0.3 11/05/2017 0500   GFRNONAA >60 11/05/2017 0500   GFRAA >60 11/05/2017 0500   Lipase     Component Value Date/Time   LIPASE 17 10/14/2017 0329       Studies/Results: Dg Chest Port 1 View  Result Date: 11/04/2017 CLINICAL DATA:  Respiratory failure. EXAM: PORTABLE CHEST 1 VIEW COMPARISON:  11/02/2017. FINDINGS: Tracheostomy tube and NG tube in stable position. Right PICC line noted with tip over superior vena cava. Heart size normal. Diffuse bilateral pulmonary infiltrates are noted. No pleural effusion. No pneumothorax. IMPRESSION: 1.  Lines and tubes in stable position. 2.  Bibasilar atelectasis/infiltrates noted on today's exam. Electronically Signed   By: Marcello Moores  Register   On: 11/04/2017 07:43   Dg Abd Portable 1v  Result Date: 11/03/2017 CLINICAL DATA:  Nasogastric tube placement EXAM: PORTABLE ABDOMEN - 1 VIEW COMPARISON:  Portable exam 1654 hours compared to 10/29/2017 FINDINGS: Patient slightly rotated to the RIGHT. Tip of nasogastric tube is in the RIGHT mid abdomen projecting over pylorus or  distal gastric antrum. Nonobstructive bowel gas pattern. Catheter projects over pelvis question wound VAC. Bones demineralized. Scattered atherosclerotic calcifications. IMPRESSION: Tip of nasogastric tube projects over distal gastric antrum or pylorus. Electronically Signed   By: Lavonia Dana M.D.   On: 11/03/2017 17:20    Anti-infectives: Anti-infectives (From admission, onward)   Start     Dose/Rate Route Frequency Ordered Stop   11/03/17 1800  metroNIDAZOLE (FLAGYL) IVPB 500 mg     500 mg 100 mL/hr over 60 Minutes Intravenous Every 8 hours 11/03/17 1714     11/03/17 1400  vancomycin (VANCOCIN) IVPB 1000 mg/200 mL premix  Status:  Discontinued      1,000 mg 200 mL/hr over 60 Minutes Intravenous Every 24 hours 11/02/17 1214 11/03/17 1657   11/02/17 1300  vancomycin (VANCOCIN) 1,500 mg in sodium chloride 0.9 % 500 mL IVPB     1,500 mg 250 mL/hr over 120 Minutes Intravenous  Once 11/02/17 1214 11/02/17 1456   11/02/17 1215  piperacillin-tazobactam (ZOSYN) IVPB 3.375 g     3.375 g 12.5 mL/hr over 240 Minutes Intravenous Every 8 hours 11/02/17 1211     10/29/17 2200  meropenem (MERREM) 1 g in sodium chloride 0.9 % 100 mL IVPB  Status:  Discontinued     1 g 200 mL/hr over 30 Minutes Intravenous Every 8 hours 10/29/17 1057 11/02/17 1019   10/25/17 1000  meropenem (MERREM) 1 g in sodium chloride 0.9 % 100 mL IVPB  Status:  Discontinued     1 g 200 mL/hr over 30 Minutes Intravenous Every 12 hours 10/24/17 1547 10/29/17 1057   10/24/17 1600  meropenem (MERREM) 2 g in sodium chloride 0.9 % 100 mL IVPB     2 g 200 mL/hr over 30 Minutes Intravenous  Once 10/24/17 1547 10/24/17 1647   10/17/17 1200  anidulafungin (ERAXIS) 100 mg in sodium chloride 0.9 % 100 mL IVPB     100 mg 78 mL/hr over 100 Minutes Intravenous Every 24 hours 10/16/17 1110 10/22/17 1423   10/16/17 1200  anidulafungin (ERAXIS) 200 mg in sodium chloride 0.9 % 200 mL IVPB     200 mg 78 mL/hr over 200 Minutes Intravenous  Once 10/16/17 1110 10/16/17 1659   10/16/17 0200  piperacillin-tazobactam (ZOSYN) IVPB 3.375 g     3.375 g 12.5 mL/hr over 240 Minutes Intravenous Every 8 hours 10/15/17 2030 10/22/17 2121   10/15/17 1000  piperacillin-tazobactam (ZOSYN) IVPB 2.25 g  Status:  Discontinued     2.25 g 100 mL/hr over 30 Minutes Intravenous Every 8 hours 10/15/17 0823 10/15/17 2028   10/14/17 1000  piperacillin-tazobactam (ZOSYN) IVPB 3.375 g  Status:  Discontinued     3.375 g 12.5 mL/hr over 240 Minutes Intravenous Every 8 hours 10/14/17 0805 10/15/17 0823   10/14/17 0315  piperacillin-tazobactam (ZOSYN) IVPB 3.375 g     3.375 g 100 mL/hr over 30 Minutes Intravenous  Once  10/14/17 0302 10/14/17 0440   10/14/17 0315  vancomycin (VANCOCIN) IVPB 1000 mg/200 mL premix     1,000 mg 200 mL/hr over 60 Minutes Intravenous  Once 10/14/17 0303 10/14/17 0440       Assessment/Plan Essential hypertension GERD Asthma Fatty liver Obesity Type 2 diabetes mellitus  Tobacco use Acute respiratory failure with hypoxemia- on trach collar, s/p trach 1/15 AKI (acute kidney injury)- Cr 0.88 Anemia- Hgb 7.3, stable Pressure injury of skin- Dr. Marla Roe  CVA vs severe deconditioning - neurology signed off 1/13, CT 1/11 no acute abnormality, PT/OT  Acute mesenteric ischemia - Right colectomy - 1/1 (PT) - Small bowel resection - 1/3 (TG) - RE-EXPLORATION OF ABDOMEN, ILEOSTOMY CREATION - 10/18/2017 - [facility]erly - tolerating TF at 60 cc/h - open wound with VAC, continue VAC MWF RLQ abscess - s/p perc drain 1/13 - CT 1/18 shows near resolution  - drain removed 1/21 Fevers-afebrile for 24h, WBC 8.5 - likely VAP, per CCM  FEN:TF; will add scheduled loperamide; prn zofran for nausea VTE: SQ heparin  ID: IV vanc/zosyn 1/20>> for VAP  Patient on trach collar, if continues to tolerate would recommend SLP for PMV and hopefully could initiate diet soon. Schedule loperamide.    LOS: 22 days    Brigid Re , [facility] 11/05/2017, 8:38 AM Pager: [phone] Mon-Fri 7:00 am-4:30 pm Sat-Sun 7:00 am-11:30 am

## 2017-11-05 NOTE — Progress Notes (Signed)
Placed PT on new ATC (28% / 5 lpm)- sp02 100%. Suctioned PT times 2- resulted in small to moderate, thick tan mucus- tolerated well. All emergency equipment in room at this time. Placed new drain sponge. Changed inner cannula. Cleaned on flange of trach. Stitches remain.

## 2017-11-05 NOTE — Consult Note (Signed)
[facility] Nurse wound consult note: Routine change of NPWT dressing with ostomy pouch: ostomy pouch has been changed since Monday.   Wound type:Midline surgical Pressure Injury POA: NA Measurement:18cm x 3cm x 3cm Wound bed: 80% red, 20% yellow in proximal base Drainage (amount, consistency, odor) serosanguinous in cannister Periwound:intact Dressing procedure/placement/frequency:One piece of black foam removed, wound cleansed. Periwound skin protected with drape. Skin barrier ring used to protect periwound skin from 4-9 o'clock. One piece of black foam used to obliterate dead space. Covered with drape, attached to suction and an immediate seal is achieved at 169mmHg continuous suction. Patient premedicated for dressing change and tolerated procedure well.   [facility] Nurse ostomy follow up Stoma type/location: Lightly larger than 1 inch red, round and budded stoma Stomal assessment/size: See above Peristomal assessment: intact Treatment options for stomal/peristomal skin: skin barrier ring and convex pouch Output: thin light yellow effluent with increased output from previous days.  Staff to empty pouch every two hours so that it does not overfill and break seal.  Ostomy pouching: 1pc.convex pouch with skin barrier ring.  Education provided: None Enrolled patient in [employer] Discharge program: No   WOC nursing team will follow, and will remain available to this patient, the nursing, surgical and medical teams.   Thanks, Maudie Flakes, MSN, RN, [place], Arther Abbott  Pager# [phone]

## 2017-11-06 ENCOUNTER — Inpatient Hospital Stay
Admission: AD | Admit: 2017-11-06 | Discharge: 2017-11-26 | Disposition: A | Discharge status: Skilled Nursing Facility | Payer: Self-pay | Source: Ambulatory Visit | Attending: Internal Medicine | Admitting: Internal Medicine

## 2017-11-06 ENCOUNTER — Other Ambulatory Visit (HOSPITAL_COMMUNITY): Payer: Self-pay

## 2017-11-06 ENCOUNTER — Inpatient Hospital Stay (HOSPITAL_COMMUNITY): Payer: Medicare Other

## 2017-11-06 DIAGNOSIS — D499 Neoplasm of unspecified behavior of unspecified site: Secondary | ICD-10-CM

## 2017-11-06 DIAGNOSIS — R1900 Intra-abdominal and pelvic swelling, mass and lump, unspecified site: Secondary | ICD-10-CM

## 2017-11-06 DIAGNOSIS — J9690 Respiratory failure, unspecified, unspecified whether with hypoxia or hypercapnia: Secondary | ICD-10-CM

## 2017-11-06 DIAGNOSIS — Z4659 Encounter for fitting and adjustment of other gastrointestinal appliance and device: Secondary | ICD-10-CM

## 2017-11-06 LAB — BASIC METABOLIC PANEL
ANION GAP: 7 (ref 5–15)
BUN: 28 mg/dL — AB (ref 6–20)
CALCIUM: 8 mg/dL — AB (ref 8.9–10.3)
CO2: 30 mmol/L (ref 22–32)
Chloride: 99 mmol/L — ABNORMAL LOW (ref 101–111)
Creatinine, Ser: 0.96 mg/dL (ref 0.44–1.00)
GFR calc Af Amer: >60 mL/min (ref >60)
GFR calc non Af Amer: 59 mL/min — ABNORMAL LOW (ref >60)
GLUCOSE: 128 mg/dL — AB (ref 65–99)
Potassium: 3.2 mmol/L — ABNORMAL LOW (ref 3.5–5.1)
SODIUM: 136 mmol/L (ref 135–145)

## 2017-11-06 LAB — MAGNESIUM: Magnesium: 1.7 mg/dL (ref 1.7–2.4)

## 2017-11-06 LAB — GLUCOSE, CAPILLARY
GLUCOSE-CAPILLARY: 129 mg/dL — AB (ref 65–99)
GLUCOSE-CAPILLARY: 106 mg/dL — AB (ref 65–99)
Glucose-Capillary: 142 mg/dL — ABNORMAL HIGH (ref 65–99)
Glucose-Capillary: 122 mg/dL — ABNORMAL HIGH (ref 65–99)

## 2017-11-06 LAB — CBC
HCT: 22.9 % — ABNORMAL LOW (ref 36.0–46.0)
Hemoglobin: 7.4 g/dL — ABNORMAL LOW (ref 12.0–15.0)
MCH: 29 pg (ref 26.0–34.0)
MCHC: 32.3 g/dL (ref 30.0–36.0)
MCV: 89.8 fL (ref 78.0–100.0)
PLATELETS: 263 10*3/uL (ref 150–400)
RBC: 2.55 MIL/uL — ABNORMAL LOW (ref 3.87–5.11)
RDW: 17 % — AB (ref 11.5–15.5)
WBC: 9 10*3/uL (ref 4.0–10.5)

## 2017-11-06 MED ORDER — PRO-STAT SUGAR FREE PO LIQD: 30.0000 mL | Freq: Every day | ORAL | 0 refills | Status: DC

## 2017-11-06 MED ORDER — SODIUM CHLORIDE 0.9 % IV SOLN: 100.0000 mg | INTRAVENOUS | 0 refills | Status: AC

## 2017-11-06 MED ORDER — SODIUM CHLORIDE 0.9 % IV SOLN
1.5000 g | Freq: Four times a day (QID) | INTRAVENOUS | Status: DC
  Filled 2017-11-06: qty 1.5

## 2017-11-06 MED ORDER — BACITRACIN-NEOMYCIN-POLYMYXIN OINTMENT TUBE: 1.0000 "application " | TOPICAL_OINTMENT | Freq: Every day | CUTANEOUS | 0 refills | Status: DC

## 2017-11-06 MED ORDER — COLLAGENASE 250 UNIT/GM EX OINT: TOPICAL_OINTMENT | Freq: Every day | CUTANEOUS | 0 refills | Status: DC

## 2017-11-06 MED ORDER — INSULIN ASPART 100 UNIT/ML ~~LOC~~ SOLN: 0.0000 [IU] | SUBCUTANEOUS | 11 refills | Status: DC

## 2017-11-06 MED ORDER — ARIPIPRAZOLE 5 MG PO TABS: 5.0000 mg | ORAL_TABLET | Freq: Every day | ORAL | 0 refills | Status: DC

## 2017-11-06 MED ORDER — POTASSIUM CHLORIDE 20 MEQ/15ML (10%) PO SOLN
30.0000 meq | ORAL | Status: AC
  Administered 2017-11-06 (×2): 30 meq
  Filled 2017-11-06 (×2): qty 30

## 2017-11-06 MED ORDER — RANITIDINE HCL 150 MG/10ML PO SYRP: 150.0000 mg | ORAL_SOLUTION | Freq: Two times a day (BID) | ORAL | 0 refills | Status: DC

## 2017-11-06 MED ORDER — CHLORHEXIDINE GLUCONATE CLOTH 2 % EX PADS: 6.0000 | MEDICATED_PAD | Freq: Every day | CUTANEOUS | 0 refills | Status: DC

## 2017-11-06 MED ORDER — AMLODIPINE BESYLATE 10 MG PO TABS: 10.0000 mg | ORAL_TABLET | Freq: Every day | ORAL | 0 refills | Status: DC

## 2017-11-06 MED ORDER — IPRATROPIUM-ALBUTEROL 0.5-2.5 (3) MG/3ML IN SOLN: 3.0000 mL | Freq: Three times a day (TID) | RESPIRATORY_TRACT | 0 refills | Status: DC

## 2017-11-06 MED ORDER — LOSARTAN POTASSIUM 25 MG PO TABS: 25.0000 mg | ORAL_TABLET | Freq: Every day | ORAL | 0 refills | Status: DC

## 2017-11-06 MED ORDER — SODIUM CHLORIDE 0.9 % IV SOLN: 100.0000 mg | INTRAVENOUS | Status: DC

## 2017-11-06 MED ORDER — VITAMIN B-12 1000 MCG/15ML PO LIQD: 1000.0000 ug | ORAL | 0 refills | Status: DC

## 2017-11-06 MED ORDER — SODIUM CHLORIDE 0.9 % IV SOLN: 1.5000 g | Freq: Four times a day (QID) | INTRAVENOUS | 0 refills | Status: AC

## 2017-11-06 MED ORDER — LOPERAMIDE HCL 1 MG/5ML PO LIQD
2.0000 mg | Freq: Four times a day (QID) | ORAL | Status: DC
  Administered 2017-11-06: 2 mg
  Filled 2017-11-06 (×3): qty 10

## 2017-11-06 MED ORDER — CHLORHEXIDINE GLUCONATE 0.12 % MT SOLN: 15.0000 mL | Freq: Two times a day (BID) | OROMUCOSAL | 0 refills | Status: DC

## 2017-11-06 MED ORDER — OSMOLITE 1.2 CAL PO LIQD: 1000.0000 mL | ORAL | 0 refills | Status: DC

## 2017-11-06 MED ORDER — ISOSORBIDE MONONITRATE 10 MG PO TABS: ORAL_TABLET | ORAL | 0 refills | Status: DC

## 2017-11-06 MED ORDER — FREE WATER: 200.0000 mL | Freq: Four times a day (QID) | 0 refills | Status: DC

## 2017-11-06 MED ORDER — METOPROLOL TARTRATE 50 MG PO TABS: 50.0000 mg | ORAL_TABLET | Freq: Two times a day (BID) | ORAL | 0 refills | Status: DC

## 2017-11-06 MED ORDER — ASPIRIN 81 MG PO CHEW: 81.0000 mg | CHEWABLE_TABLET | Freq: Every day | ORAL | 0 refills | Status: DC

## 2017-11-06 MED ORDER — RANITIDINE HCL 150 MG/10ML PO SYRP
150.0000 mg | ORAL_SOLUTION | Freq: Two times a day (BID) | ORAL | Status: DC
  Administered 2017-11-06: 150 mg
  Filled 2017-11-06 (×2): qty 10

## 2017-11-06 MED ORDER — SODIUM CHLORIDE 0.9 % IV SOLN
200.0000 mg | Freq: Once | INTRAVENOUS | Status: AC
  Administered 2017-11-06: 200 mg via INTRAVENOUS
  Filled 2017-11-06: qty 200

## 2017-11-06 MED ORDER — VENLAFAXINE HCL 75 MG PO TABS: 75.0000 mg | ORAL_TABLET | Freq: Two times a day (BID) | ORAL | 0 refills | Status: DC

## 2017-11-06 MED ORDER — MONTELUKAST SODIUM 10 MG PO TABS: 10.0000 mg | ORAL_TABLET | Freq: Every day | ORAL | 0 refills | Status: DC

## 2017-11-06 MED ORDER — ADULT MULTIVITAMIN LIQUID CH: 15.0000 mL | Freq: Every day | ORAL | 0 refills | Status: DC

## 2017-11-06 NOTE — Progress Notes (Addendum)
Pharmacy Antibiotic Note  [name] is a 72 y.o. female admitted on 10/14/2017 with ischemic bowel; previously completed Eraxis and broad spectrum antibiotics.  Pharmacy is consulted to resume antibiotic dosing with Vancomycin and Zosyn for suspected VAP.  Today, 11/06/2017: Day 24 abx, Day 5 Zosyn - per CCM, treat VAP for 5-7 days Verbal order received to to change Zosyn to Unasyn and d/c Flagyl for treatment of Clostridium clostridioforme in abdominal culture Also received verbal order to dose Eraxis for Candida glabrata in abdominal and trach aspirate cultures Afebrile WBC 9.0 SCr 0.96, CrCl ~ 55 ml/min  Plan: Unasyn 1.5g IV q6h Eraxis 200mg  IV x 1, then 100mg  IV q24h No further dosing adjustments needed  Height: 5\' 4"  (162.6 cm) Weight: 169 lb 8.5 oz (76.9 kg) IBW/kg (Calculated) : 54.7  Temp (24hrs), Avg:97.9 F (36.6 C), Min:97.3 F (36.3 C), Max:98.5 F (36.9 C)  Recent Labs  Lab 11/02/17 1146 11/02/17 1200 11/03/17 0541 11/04/17 0500 11/05/17 0500 11/06/17 0447  WBC 8.1  --  9.0 8.2 8.5 9.0  CREATININE 1.34*  --  1.35* 1.21* 0.88 0.96  LATICACIDVEN  --  1.7  --   --   --   --     Estimated Creatinine Clearance: 54.7 mL/min (by C-G formula based on SCr of 0.96 mg/dL).    Allergies  Allergen Reactions  . Codeine     Altered mental state   . Iodinated Diagnostic Agents Rash    Antimicrobials this admission:  1/1 vanc x 1 0329 1/1 zosyn >> 1/9 1/3 Anidulafungin >>1/9 1/11 Meropenem >> 1/20 1/20 Vanc >> 1/21 1/20 Zosyn >> 1/24 1/21 Flagyl >> 1/24 1/24 Unasyn >> 1/24 Eraxis >>  Dose change: 1/2 ZEI> 2.25 q8 > back to [facility] for CrCl > 20 1/16 meropenem from q12h to q8h for CrCl > 50  Microbiology results:  1/1 Ucx: ng-final 1/1 BCx: ng-final 1/1 MRSA PCR: neg 1/7 Cath tip cx: NGF 1/7 BCx: NGF 1/11 BCx: NGF 1/13 Abdomen: few Clostridium clostridioforme, few Candida glabrata 1/20 trach asp cx: few C. Albicans, moderate Candida glabrata 1/20 BCx:  ngtd  Thank you for allowing pharmacy to be a part of this patient's care.  Peggyann Juba, PharmD, BCPS Pager: [phone] 11/06/2017 12:26 PM

## 2017-11-06 NOTE — Progress Notes (Signed)
Trach sutures removed.

## 2017-11-06 NOTE — Progress Notes (Signed)
[facility] Progress Note  19 Days Post-Op  Subjective: CC: no complaints  Patient denies abdominal pain. Per RN threw up TF once overnight, but cortrak was adjusted and no issues since. Patient denies nausea.  Ileostomy output about 1.6 L on BID loperamide.  UOP good. VSS.   Objective: Vital signs in last 24 hours: Temp:  [97.3 F (36.3 C)-98.5 F (36.9 C)] 98.5 F (36.9 C) (01/24 0814) Pulse Rate:  [37-82] 79 (01/24 1000) Resp:  [11-24] 14 (01/24 1000) BP: (96-167)/(39-90) 133/45 (01/24 1000) SpO2:  [70 %-100 %] 100 % (01/24 1000) FiO2 (%):  [28 %-30 %] 28 % (01/24 0814) Weight:  [76.9 kg (169 lb 8.5 oz)] 76.9 kg (169 lb 8.5 oz) (01/24 0325) Last BM Date: 11/03/17  Intake/Output from previous day: 01/23 0701 - 01/24 0700 In: 7934.6 [I.V.:2944.6; NG/GT:4340; IV Piggyback:650] Out: 9211 [Urine:890; Stool:1680] Intake/Output this shift: Total I/O In: 300 [I.V.:300] Out: 100 [Stool:100]  PE: Gen: Alert, NAD, pleasant Card: Regular rate and rhythm, pedal pulses 2+ BL Pulm: Normal effort, clear to auscultation bilaterally Abd: Soft, non-tender, non-distended, bowel sounds present, VAC to midline wound, stoma pink with liquid stool in ileostomy pouch Skin: warm and dry, no rashes; forehead wound  Lab Results:  Recent Labs    11/05/17 0500 11/06/17 0447  WBC 8.5 9.0  HGB 7.3* 7.4*  HCT 22.8* 22.9*  PLT 269 263   BMET Recent Labs    11/05/17 0500 11/06/17 0447  NA 139 136  K 2.7* 3.2*  CL 102 99*  CO2 29 30  GLUCOSE 118* 128*  BUN 32* 28*  CREATININE 0.88 0.96  CALCIUM 7.8* 8.0*   PT/INR No results for input(s): LABPROT, INR in the last 72 hours. CMP     Component Value Date/Time   NA 136 11/06/2017 0447   K 3.2 (L) 11/06/2017 0447   CL 99 (L) 11/06/2017 0447   CO2 30 11/06/2017 0447   GLUCOSE 128 (H) 11/06/2017 0447   BUN 28 (H) 11/06/2017 0447   CREATININE 0.96 11/06/2017 0447   CALCIUM 8.0 (L) 11/06/2017 0447   PROT 5.2 (L)  11/05/2017 0500   ALBUMIN 1.9 (L) 11/05/2017 0500   AST 47 (H) 11/05/2017 0500   ALT 70 (H) 11/05/2017 0500   ALKPHOS 170 (H) 11/05/2017 0500   BILITOT 0.3 11/05/2017 0500   GFRNONAA 59 (L) 11/06/2017 0447   GFRAA >60 11/06/2017 0447   Lipase     Component Value Date/Time   LIPASE 17 10/14/2017 0329       Studies/Results: Dg Abd Portable 1v  Result Date: 11/06/2017 CLINICAL DATA:  NG tube placement EXAM: PORTABLE ABDOMEN - 1 VIEW COMPARISON:  Abdomen film of 11/03/2017 FINDINGS: NG tube tip overlies the expected position of the mid distal stomach. No bowel obstruction is seen. No opaque calculi are noted. Some splenic artery calcification is present. IMPRESSION: NG tube tip overlies the expected mid distal stomach. Electronically Signed   By: Ivar Drape M.D.   On: 11/06/2017 09:12    Anti-infectives: Anti-infectives (From admission, onward)   Start     Dose/Rate Route Frequency Ordered Stop   11/03/17 1800  metroNIDAZOLE (FLAGYL) IVPB 500 mg     500 mg 100 mL/hr over 60 Minutes Intravenous Every 8 hours 11/03/17 1714     11/03/17 1400  vancomycin (VANCOCIN) IVPB 1000 mg/200 mL premix  Status:  Discontinued     1,000 mg 200 mL/hr over 60 Minutes Intravenous Every 24 hours 11/02/17 1214 11/03/17 1657  11/02/17 1300  vancomycin (VANCOCIN) 1,500 mg in sodium chloride 0.9 % 500 mL IVPB     1,500 mg 250 mL/hr over 120 Minutes Intravenous  Once 11/02/17 1214 11/02/17 1456   11/02/17 1215  piperacillin-tazobactam (ZOSYN) IVPB 3.375 g     3.375 g 12.5 mL/hr over 240 Minutes Intravenous Every 8 hours 11/02/17 1211     10/29/17 2200  meropenem (MERREM) 1 g in sodium chloride 0.9 % 100 mL IVPB  Status:  Discontinued     1 g 200 mL/hr over 30 Minutes Intravenous Every 8 hours 10/29/17 1057 11/02/17 1019   10/25/17 1000  meropenem (MERREM) 1 g in sodium chloride 0.9 % 100 mL IVPB  Status:  Discontinued     1 g 200 mL/hr over 30 Minutes Intravenous Every 12 hours 10/24/17 1547 10/29/17  1057   10/24/17 1600  meropenem (MERREM) 2 g in sodium chloride 0.9 % 100 mL IVPB     2 g 200 mL/hr over 30 Minutes Intravenous  Once 10/24/17 1547 10/24/17 1647   10/17/17 1200  anidulafungin (ERAXIS) 100 mg in sodium chloride 0.9 % 100 mL IVPB     100 mg 78 mL/hr over 100 Minutes Intravenous Every 24 hours 10/16/17 1110 10/22/17 1423   10/16/17 1200  anidulafungin (ERAXIS) 200 mg in sodium chloride 0.9 % 200 mL IVPB     200 mg 78 mL/hr over 200 Minutes Intravenous  Once 10/16/17 1110 10/16/17 1659   10/16/17 0200  piperacillin-tazobactam (ZOSYN) IVPB 3.375 g     3.375 g 12.5 mL/hr over 240 Minutes Intravenous Every 8 hours 10/15/17 2030 10/22/17 2121   10/15/17 1000  piperacillin-tazobactam (ZOSYN) IVPB 2.25 g  Status:  Discontinued     2.25 g 100 mL/hr over 30 Minutes Intravenous Every 8 hours 10/15/17 0823 10/15/17 2028   10/14/17 1000  piperacillin-tazobactam (ZOSYN) IVPB 3.375 g  Status:  Discontinued     3.375 g 12.5 mL/hr over 240 Minutes Intravenous Every 8 hours 10/14/17 0805 10/15/17 0823   10/14/17 0315  piperacillin-tazobactam (ZOSYN) IVPB 3.375 g     3.375 g 100 mL/hr over 30 Minutes Intravenous  Once 10/14/17 0302 10/14/17 0440   10/14/17 0315  vancomycin (VANCOCIN) IVPB 1000 mg/200 mL premix     1,000 mg 200 mL/hr over 60 Minutes Intravenous  Once 10/14/17 0303 10/14/17 0440       Assessment/Plan Essential hypertension GERD Asthma Fatty liver Obesity Type 2 diabetes mellitus  Tobacco use Acute respiratory failure with hypoxemia- on trach collar, s/p trach 1/15 AKI (acute kidney injury)- Cr 0.96 Anemia- Hgb 7.4, stable Pressure injury of skin- Dr. Marla Roe  CVA vs severe deconditioning - neurology signed off 1/13, CT 1/11 no acute abnormality, PT/OT   Acute mesenteric ischemia - Right colectomy - 1/1 (PT) - Small bowel resection - 1/3 (TG) - RE-EXPLORATION OF ABDOMEN, ILEOSTOMY CREATION -  10/18/2017 - [place]er[place] - tolerating TF at 60 cc/h - open wound with VAC, continue VAC MWF RLQ abscess - s/p perc drain 1/13 - CT 1/18 shows near resolution  - drainremoved 1/21 Fevers-afebrile for 24h, WBC9.0 - likely VAP, per CCM  FEN:TF; increase scheduled loperamide; prn zofran for nausea; replace K  VTE: SQ heparin  ID: IV vanc/zosyn 1/20>> for VAP  Patient intermittently weaning to trach collar from vent. When tolerating trach collar recommend SLP for PMV and hopefully could initiate diet soon. Increase scheduled loperamide.   Ok for discharge to [facility] from a surgical standpoint, will need continued monitoring of ileostomy  output and fluid status/lytes. Continue VAC MWF.     LOS: 23 days    Brigid Re , [facility] 11/06/2017, 10:31 AM Pager: [phone] Consults: [phone] Mon-Fri 7:00 am-4:30 pm Sat-Sun 7:00 am-11:30 am

## 2017-11-06 NOTE — Progress Notes (Signed)
Report called to receiving Rn [id number]. Pts son informed and aware of transfer. Patient with no complaints at the current time. Will arrange ambulance transfer. MD completing D/c paperwork.

## 2017-11-06 NOTE — Discharge Summary (Signed)
Discharge Summary  [name]:[id number] DOB: [birth year]  PCP: Leanna Battles, MD  Admit date: 10/14/2017 Discharge date: 11/06/2017  Time spent: >58mins, more than 50% time spent on coordination of care  Patient is to discharge to [facility] for ongoing medical care    Discharge Diagnoses:  Active Hospital Problems   Diagnosis Date Noted  . 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  Ileostomy 10/18/2017 10/14/2017  . Ileostomy in place [facility]) 10/23/2017  . Pressure injury of skin 10/19/2017  . Ischemic colitis ([place]) 10/14/2017  . Acute respiratory failure with hypoxemia ([place]) 10/14/2017  . Acute post-operative pain 10/14/2017  . AKI (acute kidney injury) ([place]) 10/14/2017  . Anemia 10/14/2017  . Tobacco use 02/19/2017  . Type 2 diabetes mellitus ([place]) 10/27/2015  . Obesity 04/30/2011  . Fatty liver 04/30/2011  . Asthma 04/09/2011  . GERD 12/17/2007  . Essential hypertension 12/17/2007    Resolved Hospital Problems   Diagnosis Date Noted Date Resolved  . S/P exploratory laparotomy 10/14/2017 10/23/2017  . Large bowel ischemia [facility]) 10/14/2017 10/23/2017    Discharge Condition: stable  Diet recommendation: npo All meds /nutrition per NG which is placed 8 days ago Continue trach care/speech therapy for PMSV trial, hopefully able to start to take oral soon.    Filed Weights   11/03/17 0500 11/04/17 0500 11/06/17 0325  Weight: 75.8 kg (167 lb 1.7 oz) 75.8 kg (167 lb 1.7 oz) 76.9 kg (169 lb 8.5 oz)    History of present illness:   72 year old female with multiple medical problems.  Presented acutely with abdominal pain found to have pneumatosis of the right colon on CT scan.  she is admitted to general surgery team on 1/1 and Status post emergent laparotomy on the morning of October 14, 2017 with extended right colectomy for ischemic colitis  and application of abdominal vacuum dressing by Dr. Autumn Messing and estimated blood loss of  100 cc postoperatively.  Patient was sent to the intensive care unit in the intubated state and critical care medicine was consulted for postoperative acute respiratory failure and medical management, RN reports pain, hyp0ertension, vent dysnchrony in ICU. ET tube is at carina.  Patient required further small bowel resection and subsequent ileostomy 1/5. Course complicated by acute bilateral watershed infarcts/CVAand prolonged ventilation requiring tracheostomy. Pt was weaned off the vent for 48 hours but resumed on 01/20, started on empiric ABX for fever and LLL infiltrate for presumptive VAP. Pt tolerated trach collar on 01/21.   SIGNIFICANT EVENTS: 1/01 ex lap, R colectomy 1/03 ex lap, partial omentectomy, resection of ischemic ileum (130cm)  1/05 Ex lap, abdominal wall closure 1/09 Extubation 1/11 Acute CVA, reintubated 1/13 Fever >CT abdomen/pelvis: abscess, Moving around well, BP labile. IR perc drain. 1/14 Weaning on PSV 10/5, 30%, Tmax 101.1. Pt in bigeminy (has been).  1/15 Trach  1/16 Tolerated ATC ~ 5 hours 1/17 ATC 28% for ~ 7 hours. TPN stopped  1/18 Weaning on ATC 28%. Bicarbonate stopped . ATC 28%. Pending CT abd.  1/19 ATC. On fent gtt. Open drain with VAC being managed by CCS 1/20Vent overnight after being on ATC48h. 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   Ileostomy 10/18/2017, VAC in place  - Right colectomy - 1/1 (PT) - Small bowel resection - 1/3 (TG) - RE-EXPLORATION OF ABDOMEN, ILEOSTOMY CREATION - 10/18/2017 - [place], significant ileostomy output - continue VAC MWF -she is on tube feeds due to still requiring vent, per general surgery, patient can start po once able to tolerate trach collar and speech to follow for PMV.  RLQ abscess - s/p perc drain 1/13 - post op right abd fluid collection, s/p drain placed by IR 01/13 - drain fluid cx with candida glabrata/clostridium  - follow up CT with only small amount of residual fluid at drained site, output minimal ,  drained removed 01/21   fever on 01/12 and again on 01/19,  -suspected new infiltrate as noted below on 01/20, -Trach aspirate from 1/20  + candida glabrata - treating with ABX , plan for total of 7 days, last dose abx on 1/27 Case discussed with ID Dr Tommy Medal over the phone on 1/24 due to culture + glabrata from the drain culture as well, recommend to treat with eraxis, mirco lab to do sensitivity testing on galbrata.      Septic shock/ Acute on chronic respiratory failure with hypoxemia/ VDRF/shock liver - now with trach and also developed new left mid and lower lobe infiltrate on 01/20, ? VAP vs atelectasis  - trach care per protocol    Acute metabolic encephalopathy, Acute bilateral CVA - felt to be due to hypotension / watershed in setting of septic shock on presentation - symptoms waxing and waning  - neurology team has signed off on 01/13, on asa. ldl 48. - mental status remains stable this AM     HTN, CAD - no anginal concerns - reasonable inpatient control  - continue Norvasc, Imdur 30 mg BID, Metoprolol 50 mg BID, low dose cozaar.  - also on aspirin per home medical regimen     Severe PCM - on TF for now     AKI - resolved 10/30/17  - continue to trend BMP  Hypokalemia/hypomagnesemia:  Replace prn     Mild anemia without bleeding - no evidence of  active bleeding - Hg overall stable - CBC in AM  Right forehead lesion:  plastic surgery  Dr Marla Roe consulted on 1/15 per Dr Marla Roe" Forehead wound recommend head elevation as able.  Bactroban to the area.  Will wait 1 -2 weeks and re-evaluate.  No surgical intervention at this time."  Sacral Decubitus ulcer: stage II  Santyl    DVT prophylaxis while In the hospital: Heparin SQ Code Status: Full  Family Communication: pt at bedside  Disposition Plan: to [facility]  Consultants:   General Surgery   IR  PCCM  Neurology  Plastic surgery  Phone conversation with Infectious disease  STUDIES:  1/01 CT abdomen/pelvis>>diffuse pneumatosis in cecum and ascending colon with distension, bibasilar airspace opacities, aortic calcification, non-obstructing left kidney stone  1/11CT head>> NAICP  1/11 MRI/MRA brain: Acute cerebral infarction bilateral left greater than right, left parietal and left temporal lobe. Small acute infarct right frontal parietal white hemorrhage, occluded left common carotid.  1/12 CT Ab/pelvis>  loculated fluid collection along R abdominal wall; cannot exclude cirrhosis, liver lobe enlargement  CULTURES:  1/1 Blood > neg  1/1 urine > neg  1/7 blood > neg  1/7 cath tip > neg  1/11 blood > neg  1/13 Abdomen fluid >>  1/20 BCx2 >>  1/20 Sputum >>  ANTIBIOTICS:  Zosyn 12/31 > 1/9  Anidulafungin 1/4 >1/9, restart from 1/24 for total of 7 days  Meropenem 1/11 >1/20   Vanco 1/20 >> stopped  Zosyn 1/20 >>stopped  unasyn from 1/24-1/27    LINES/TUBES:  1/1 ETT > 1/9, 1/11 > 1/15  1/1 R IJ CVL > 1/8  1/7 PICC >>  Trach 1/15 >>  Wound vac, to be changed MWF  Foley, hopefully able to remove foley soon  Ng tube day 8 as of 1/24   Discharge Exam: BP (!) 122/45   Pulse 64   Temp 98 F (36.7 C) (Oral)   Resp 15   Ht 5\' 4"  (1.626 m)   Wt 76.9 kg (169 lb 8.5 oz)   SpO2 95%   BMI 29.10 kg/m    General: alert, NAD Cardiovascular: RRR Respiratory: diminished at basis, no wheezing,  Ab: midline wound attached to wound vac, + ileostomy with liquid output, + foley.  Discharge Instructions You were cared for by a hospitalist during your hospital stay. If you have any questions about your discharge medications or the care you received while you were in the hospital after you are discharged, you can call the unit and asked to speak with the hospitalist on call if the hospitalist that took care of you is not available. Once you are discharged, your primary care physician will handle any further medical issues. Please note that NO REFILLS for any discharge medications will be authorized once you are discharged, as it is imperative that you return to your primary care physician (or establish a relationship with a primary care physician if you do not have one) for your aftercare needs so that they can reassess your need for medications and monitor your lab values.  Discharge Instructions    Diet general   Complete by:  As directed    Npo meds and nutrition through ng tube   Increase activity slowly   Complete by:  As directed      Allergies as of 11/06/2017      Reactions   Codeine    Altered mental state    Iodinated Diagnostic Agents Rash      Medication List    STOP taking these medications   ASPERCREME HEAT 10 % Gel Generic drug:  Menthol (Topical Analgesic)   aspirin EC 81 MG tablet Replaced by:  aspirin 81 MG chewable tablet   atorvastatin 20 MG tablet Commonly known as:  LIPITOR   dicyclomine 20 MG tablet Commonly known as:  BENTYL   diphenhydrAMINE 50 MG tablet Commonly known as:  BENADRYL   esomeprazole 40 MG capsule Commonly known as:  NEXIUM   furosemide 20 MG tablet Commonly known as:  LASIX   gabapentin 300 MG capsule Commonly known as:  NEURONTIN   HYDROcodone-acetaminophen 5-325 MG tablet Commonly known as:  NORCO/VICODIN   ibuprofen 200 MG  tablet Commonly known as:  ADVIL,MOTRIN   isosorbide mononitrate 60 MG 24 hr tablet Commonly known as:  IMDUR Replaced by:  isosorbide mononitrate 10 MG tablet   loperamide 1 MG/5ML solution Commonly known as:  IMODIUM   methscopolamine 5 MG tablet Commonly known as:  PAMINE FORTE  metoprolol succinate 100 MG 24 hr tablet Commonly known as:  TOPROL-XL   MYRBETRIQ 50 MG Tb24 tablet Generic drug:  mirabegron ER   Pancrelipase (Lip-Prot-Amyl) 25000-79000 units Cpep Commonly known as:  ZENPEP   rOPINIRole 0.5 MG tablet Commonly known as:  REQUIP   tamsulosin 0.4 MG Caps capsule Commonly known as:  FLOMAX   venlafaxine XR 150 MG 24 hr capsule Commonly known as:  EFFEXOR-XR Replaced by:  venlafaxine 75 MG tablet   VICTOZA 18 MG/3ML Sopn Generic drug:  liraglutide   zolpidem 10 MG tablet Commonly known as:  AMBIEN     TAKE these medications   amLODipine 10 MG tablet Commonly known as:  NORVASC Place 1 tablet (10 mg total) into feeding tube daily. What changed:  how to take this   ampicillin-sulbactam 1.5 g in sodium chloride 0.9 % 50 mL Inject 1.5 g into the vein every 6 (six) hours for 3 days.   anidulafungin 100 mg in sodium chloride 0.9 % 100 mL Inject 100 mg into the vein daily for 7 days. Start taking on:  11/07/2017   ARIPiprazole 5 MG tablet Commonly known as:  ABILIFY Place 1 tablet (5 mg total) into feeding tube daily. What changed:  how to take this   aspirin 81 MG chewable tablet Place 1 tablet (81 mg total) into feeding tube daily. Start taking on:  11/07/2017 Replaces:  aspirin EC 81 MG tablet   chlorhexidine 0.12 % solution Commonly known as:  PERIDEX 15 mLs by Mouth Rinse route 2 (two) times daily.   Chlorhexidine Gluconate Cloth 2 % Pads Apply 6 each topically daily. Start taking on:  11/07/2017   collagenase ointment Commonly known as:  SANTYL Apply topically daily. Start taking on:  11/07/2017   feeding supplement (OSMOLITE 1.2 CAL)  Liqd Place 1,000 mLs into feeding tube continuous.   feeding supplement (PRO-STAT SUGAR FREE 64) Liqd Place 30 mLs into feeding tube daily. Start taking on:  11/07/2017   free water Soln Place 200 mLs into feeding tube every 6 (six) hours.   insulin aspart 100 UNIT/ML injection Commonly known as:  novoLOG Inject 0-15 Units into the skin every 4 (four) hours.   ipratropium-albuterol 0.5-2.5 (3) MG/3ML Soln Commonly known as:  DUONEB Take 3 mLs by nebulization 3 (three) times daily.   isosorbide mononitrate 10 MG tablet Commonly known as:  ISMO,MONOKET 30mg  bid per tube Replaces:  isosorbide mononitrate 60 MG 24 hr tablet   losartan 25 MG tablet Commonly known as:  COZAAR Place 1 tablet (25 mg total) into feeding tube daily. What changed:    medication strength  how much to take  how to take this   metoprolol tartrate 50 MG tablet Commonly known as:  LOPRESSOR Place 1 tablet (50 mg total) into feeding tube 2 (two) times daily.   montelukast 10 MG tablet Commonly known as:  SINGULAIR Place 1 tablet (10 mg total) into feeding tube daily. What changed:  how to take this   multivitamin Liqd Place 15 mLs into feeding tube daily. Start taking on:  11/07/2017   neomycin-bacitracin-polymyxin Oint Commonly known as:  NEOSPORIN Apply 1 application topically daily. Start taking on:  11/07/2017   nitroGLYCERIN 0.4 MG SL tablet Commonly known as:  NITROSTAT Place 1 tablet (0.4 mg total) under the tongue every 5 (five) minutes as needed for chest pain.   ranitidine 150 MG/10ML syrup Commonly known as:  ZANTAC Place 10 mLs (150 mg total) into feeding tube 2 (two) times  daily.   venlafaxine 75 MG tablet Commonly known as:  EFFEXOR Place 1 tablet (75 mg total) into feeding tube 2 (two) times daily with a meal. Replaces:  venlafaxine XR 150 MG 24 hr capsule   Vitamin B-12 1000 MCG/15ML Liqd Place 15 mLs (1,000 mcg total) into feeding tube once a week. What changed:     medication strength  how much to take  how to take this      Allergies  Allergen Reactions  . Codeine     Altered mental state   . Iodinated Diagnostic Agents Rash   Follow-up Information    Leanna Battles, MD Follow up.   Specialty:  Internal Medicine Contact information: [address] [place] [address] [phone]        Jovita Kussmaul, MD. Call.   Specialty:  General Surgery Why:  Call and schedule a follow up appointment in 3-4 weeks.  Contact information: [address] [place] [address] [phone]            The results of significant diagnostics from this hospitalization (including imaging, microbiology, ancillary and laboratory) are listed below for reference.    Significant Diagnostic Studies: Ct Abdomen Pelvis Wo Contrast  Result Date: 10/31/2017 CLINICAL DATA:  Status post peritoneal abscess drainage. EXAM: CT ABDOMEN AND PELVIS WITHOUT CONTRAST TECHNIQUE: Multidetector CT imaging of the abdomen and pelvis was performed following the standard protocol without IV contrast. COMPARISON:  CT scans of October 26, 2017 and October 25, 2017. FINDINGS: Lower chest: Mild bilateral posterior basilar subsegmental atelectasis. Hepatobiliary: No focal liver abnormality is seen. No gallstones, gallbladder wall thickening, or biliary dilatation. Pancreas: Unremarkable. No pancreatic ductal dilatation or surrounding inflammatory changes. Spleen: Normal in size without focal abnormality. Adrenals/Urinary Tract: Adrenal glands are unremarkable. Stable left nephrolithiasis is noted. No hydronephrosis or renal obstruction is noted. Foley catheter is noted in urinary bladder. Air is noted in urinary bladder most likely due to catheter. Stomach/Bowel: Continued presence of right lower quadrant ileostomy. 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 There is no abnormal bowel dilatation. Nasogastric tube is seen in distal stomach. Vascular/Lymphatic: Aortic  atherosclerosis. No enlarged abdominal or pelvic lymph nodes. Reproductive: Status post hysterectomy. No adnexal masses. Other: Small amount of free fluid is noted in the pelvis. Right-sided abscess noted on prior exam is significantly smaller in size status post pigtail drainage catheter placement. Only a mild amount of residual fluid remains. Musculoskeletal: No acute or significant osseous findings. IMPRESSION: Right lateral abdominal abscess is nearly resolved status post pigtail catheter placement. Small amount of residual fluid is noted. Stable nonobstructive left nephrolithiasis. Postsurgical changes as described above. Electronically Signed   By: Marijo Conception, M.D.   On: 10/31/2017 16:59   Ct Abdomen Pelvis Wo Contrast  Result Date: 10/25/2017 CLINICAL DATA:  Abdominal pain. Fever. Abscess suspected. Exploratory laparotomy on 10/18/2017. Resection of ischemic small bowel. Prior right hemicolectomy 10/14/2017. EXAM: CT ABDOMEN AND PELVIS WITHOUT CONTRAST TECHNIQUE: Multidetector CT imaging of the abdomen and pelvis was performed following the standard protocol without IV contrast. COMPARISON:  Plain films 10/25/2017.  Most recent CT of 10/14/2017. FINDINGS: Lower chest: bibasilar atelectasis. Bibasilar bilateral breast implants. Normal heart size. Hepatobiliary: Caudate and lateral segment left liver lobe enlargement. Normal gallbladder, without biliary ductal dilatation. Pancreas: Mild pancreatic atrophy. Spleen: Normal in size, without focal abnormality. Adrenals/Urinary Tract: Normal adrenal glands. Lower pole left renal collecting system calculi. No hydronephrosis. Foley catheter within the urinary bladder, not entirely decompressed. Stomach/Bowel: Nasogastric tube  terminating at the gastric body. Long Hartmann's pouch. Underdistended rectosigmoid region. Right lower quadrant ileostomy. Otherwise normal small bowel. Vascular/Lymphatic: Aortic and branch vessel atherosclerosis. No abdominopelvic  adenopathy. Reproductive: Hysterectomy.  No adnexal mass. Other: Loculated ascites is identified within the right lower abdomen, including at 8.0 x 6.9 cm on image 46/series 2. This is positioned immediately lateral to the ileostomy. Trace right-sided pelvic fluid is also identified on image 66/series 2. Nonspecific presacral edema. Minimal interloop mesenteric fluid including on image 59/series 2. Musculoskeletal: No acute osseous abnormality. IMPRESSION: 1. Status post right-sided ileostomy and long Hartmann's pouch. Loculated ascites in the right-sided abdomen, adjacent to the ostomy site, suspicious for infected ascites and possible developing abscess. Sensitivity decreased on this noncontrast exam. 2. Other areas of smaller volume pelvic and mesenteric fluid are nonspecific. 3. Coronary artery atherosclerosis. Aortic Atherosclerosis (ICD10-I70.0). 4. Caudate and lateral segment left liver lobe enlargement. Cannot exclude mild cirrhosis. 5. Left nephrolithiasis. Electronically Signed   By: Abigail Miyamoto M.D.   On: 10/25/2017 17:13   Ct Abdomen Pelvis Wo Contrast  Result Date: 10/14/2017 CLINICAL DATA:  Code sepsis. Tachycardia and decreased respiratory rate. Mid abdominal pain, acute onset. EXAM: CT CHEST, ABDOMEN AND PELVIS WITHOUT CONTRAST TECHNIQUE: Multidetector CT imaging of the chest, abdomen and pelvis was performed following the standard protocol without IV contrast. COMPARISON:  CT of the abdomen and pelvis performed 01/17/2017, and CT of the chest performed 08/18/2017 FINDINGS: CT CHEST FINDINGS Cardiovascular: The heart is normal in size. Scattered coronary artery calcifications are seen. Calcification is noted at the thoracic aorta and proximal great vessels. Mediastinum/Nodes: The mediastinum is otherwise unremarkable in appearance. No mediastinal lymphadenopathy is seen. No pericardial effusion is identified. The visualized portions of the thyroid gland are unremarkable. No axillary  lymphadenopathy is seen. Lungs/Pleura: Mild peripheral scarring is noted bilaterally. Mild hazy bibasilar airspace opacities may reflect pneumonia. No definite pleural effusion or pneumothorax is seen. No dominant mass is identified. Musculoskeletal: No acute osseous abnormalities are identified. The visualized musculature is unremarkable in appearance. Bilateral breast implants are noted. CT ABDOMEN PELVIS FINDINGS Hepatobiliary: The liver is unremarkable in appearance. The gallbladder is mildly distended. The common bile duct remains normal in caliber. Pancreas: The pancreas is within normal limits. Spleen: The spleen is unremarkable in appearance. Adrenals/Urinary Tract: The adrenal glands are unremarkable in appearance. Nonspecific perinephric stranding is noted bilaterally. A nonobstructing 6 mm stone is noted at the lower pole of the left kidney. There is no evidence of hydronephrosis. No obstructing ureteral stones are seen. Stomach/Bowel: There is diffuse pneumatosis involving the cecum and ascending colon, with distention of the ascending colon to 7.4 cm in diameter. Diffuse surrounding soft tissue inflammation and fluid are seen. Bowel ischemia is a concern. There is wall thickening along the hepatic flexure of the colon, which may reflect an infectious or inflammatory process. No definite mass was seen in April. The remainder of the colon is unremarkable. The small bowel is grossly unremarkable in appearance. The stomach is unremarkable in appearance. Vascular/Lymphatic: Diffuse calcification is seen along the abdominal aorta and its branches. There is likely severe luminal narrowing at the aortic bifurcation. The inferior vena cava is grossly unremarkable. No retroperitoneal lymphadenopathy is seen. No pelvic sidewall lymphadenopathy is identified. Reproductive: The bladder is decompressed, with a Foley catheter in place. The patient is status post hysterectomy. Trace free fluid within the pelvis may  reflect the colonic process. Other: No additional soft tissue abnormalities are seen. Musculoskeletal: No acute osseous abnormalities are identified.  The visualized musculature is unremarkable in appearance. IMPRESSION: 1. Diffuse pneumatosis involving the cecum and ascending colon, with distention of the ascending colon to 7.4 cm in diameter. Diffuse surrounding soft tissue inflammation and fluid seen. This is concerning for bowel ischemia. Wall thickening along the hepatic flexure of the colon may reflect an underlying infectious or inflammatory process. No definite mass was seen in April. 2. Mild hazy bibasilar airspace opacities may reflect pneumonia. 3. Diffuse calcification along the abdominal aorta and its branches, with likely severe luminal narrowing at the aortic bifurcation. 4. Scattered coronary calcifications. 5. Nonobstructing 6 mm stone at the lower pole of the left kidney. These results were called by telephone at the time of interpretation on 10/14/2017 at 4:23 am to Dr. Jola Schmidt, who verbally acknowledged these results. Electronically Signed   By: Garald Balding M.D.   On: 10/14/2017 04:24   Dg Chest 1 View  Result Date: 10/14/2017 CLINICAL DATA:  72 year old female with a history of central line placement EXAM: CHEST 1 VIEW COMPARISON:  CT 10/14/2017, plain film 10/14/2017 FINDINGS: Cardiomediastinal silhouette unchanged. Interval placement of gastric tube projected over the mediastinum and terminates in the abdomen out of the field of view. Interval placement of endotracheal tube, directed towards the right mainstem bronchus, and may be pulled back approximately 3-4 cm for better positioning. Interval placement of right IJ central venous catheter appears to terminate superior cavoatrial junction. Low lung volumes with basilar opacities, likely atelectasis. No pneumothorax. IMPRESSION: Low lung volumes with likely atelectasis. Interval placement of right IJ central catheter with no  complicating features. Endotracheal tube terminates at the carina directed towards the right mainstem bronchus. Withdrawal of approximately 3-4 cm recommended. Gastric tube has been placed, terminating out of the field of view in the upper abdomen. These results were called by telephone at the time of interpretation on 10/14/2017 at 9:40 am to the nurse, Ms Leanna Sato, who verbally acknowledged these results. Electronically Signed   By: Corrie Mckusick D.O.   On: 10/14/2017 09:42   Dg Abd 1 View  Result Date: 10/29/2017 CLINICAL DATA:  Nasogastric tube placement EXAM: ABDOMEN - 1 VIEW COMPARISON:  Study obtained earlier in the day FINDINGS: Nasogastric tube tip and side port are in the stomach. There is moderate stool in the colon. The visualized bowel gas pattern is unremarkable. No obstruction or free air evident. No edema or consolidation is noted in the visualized lung regions. There is aortic atherosclerosis. Tracheostomy tip is 4.1 cm above the carina. IMPRESSION: Nasogastric tube tip and side port in stomach. No evident bowel obstruction or free air. There is aortic atherosclerosis. Aortic Atherosclerosis (ICD10-I70.0). Electronically Signed   By: Lowella Grip III M.D.   On: 10/29/2017 10:04   Dg Abd 1 View  Result Date: 10/29/2017 CLINICAL DATA:  72 year old female status post enteric tube placement. EXAM: ABDOMEN - 1 VIEW; PORTABLE CHEST - 1 VIEW COMPARISON:  Radiograph dated 10/28/2017 FINDINGS: Enteric tube with side port in the distal esophagus and tip in the region of the gastroesophageal junction. Recommend advancing the tube further into the stomach by approximately 7 cm. Tracheostomy tube above the carina. Right IJ central line with tip in the region of the cavoatrial junction. Shallow inspiration. No focal consolidation, pleural effusion, or pneumothorax. The cardiac silhouette is within normal limits. The visualized upper abdomen is grossly unremarkable. IMPRESSION: Enteric tube with side  port in the distal esophagus and tip at the gastroesophageal junction. Recommend advancing the tube further into the stomach.  Electronically Signed   By: Anner Crete M.D.   On: 10/29/2017 04:58   Dg Abd 1 View  Result Date: 10/28/2017 CLINICAL DATA:  72 y/o  F; nasogastric tube adjustment. EXAM: ABDOMEN - 1 VIEW COMPARISON:  10/28/2017 abdomen radiograph FINDINGS: Enteric tube tip projects over gastric body, now well position. Right central venous catheter tip projects over lower SVC. Surgical drain projects over right lower quadrant. IMPRESSION: Enteric tube tip projects over gastric body, now well positioned and uncoiled. Electronically Signed   By: Kristine Garbe M.D.   On: 10/28/2017 23:42   Dg Abd 1 View  Result Date: 10/28/2017 CLINICAL DATA:  73 y/o  F; nasogastric tube placement. EXAM: ABDOMEN - 1 VIEW COMPARISON:  10/28/2017 abdomen radiograph FINDINGS: Further insertion of nasogastric tube with the tip still coiled back to the gastroesophageal junction. Stable bowel gas pattern. Drain in the right lower quadrant. IMPRESSION: Further insertion of nasogastric tube with the tip still coiled back to the gastroesophageal junction. Electronically Signed   By: Kristine Garbe M.D.   On: 10/28/2017 22:41   Dg Abd 1 View  Result Date: 10/28/2017 CLINICAL DATA:  Nasogastric tube adjustment. EXAM: ABDOMEN - 1 VIEW COMPARISON:  10/28/2017 at 1729 hours FINDINGS: The nasal/orogastric tube has been further inserted. However, the tip remains at the gastroesophageal junction. The side hole lies below this well within the stomach. IMPRESSION: 1. Nasogastric tube has been further inserted. It remains curled with its distal tip still present at the gastroesophageal junction. Electronically Signed   By: Lajean Manes M.D.   On: 10/28/2017 19:43   Dg Abd 1 View  Result Date: 10/28/2017 CLINICAL DATA:  Encounter for NG tube placement EXAM: ABDOMEN - 1 VIEW COMPARISON:  None. FINDINGS:  Nasogastric tube tip is coiled at the gastroesophageal junction. Visualized bowel gas pattern within the upper abdomen is nonobstructive. Lung bases appear clear. IMPRESSION: Nasogastric tube tip coiled at the gastroesophageal junction. Recommend advancing. Electronically Signed   By: Franki Cabot M.D.   On: 10/28/2017 17:53   Dg Abd 1 View  Result Date: 10/25/2017 CLINICAL DATA:  NG tube placement. EXAM: ABDOMEN - 1 VIEW COMPARISON:  CT abdomen 10/14/2017 FINDINGS: The NG tube tip is in the body region of the stomach. The proximal port is in the fundal region. Unremarkable visualized bowel gas pattern. The bony structures appear normal. IMPRESSION: The NG tube is in good position with the tip in the body region of the stomach. Electronically Signed   By: Marijo Sanes M.D.   On: 10/25/2017 16:03   Ct Chest Wo Contrast  Result Date: 10/14/2017 CLINICAL DATA:  Code sepsis. Tachycardia and decreased respiratory rate. Mid abdominal pain, acute onset. EXAM: CT CHEST, ABDOMEN AND PELVIS WITHOUT CONTRAST TECHNIQUE: Multidetector CT imaging of the chest, abdomen and pelvis was performed following the standard protocol without IV contrast. COMPARISON:  CT of the abdomen and pelvis performed 01/17/2017, and CT of the chest performed 08/18/2017 FINDINGS: CT CHEST FINDINGS Cardiovascular: The heart is normal in size. Scattered coronary artery calcifications are seen. Calcification is noted at the thoracic aorta and proximal great vessels. Mediastinum/Nodes: The mediastinum is otherwise unremarkable in appearance. No mediastinal lymphadenopathy is seen. No pericardial effusion is identified. The visualized portions of the thyroid gland are unremarkable. No axillary lymphadenopathy is seen. Lungs/Pleura: Mild peripheral scarring is noted bilaterally. Mild hazy bibasilar airspace opacities may reflect pneumonia. No definite pleural effusion or pneumothorax is seen. No dominant mass is identified. Musculoskeletal: No acute  osseous abnormalities  are identified. The visualized musculature is unremarkable in appearance. Bilateral breast implants are noted. CT ABDOMEN PELVIS FINDINGS Hepatobiliary: The liver is unremarkable in appearance. The gallbladder is mildly distended. The common bile duct remains normal in caliber. Pancreas: The pancreas is within normal limits. Spleen: The spleen is unremarkable in appearance. Adrenals/Urinary Tract: The adrenal glands are unremarkable in appearance. Nonspecific perinephric stranding is noted bilaterally. A nonobstructing 6 mm stone is noted at the lower pole of the left kidney. There is no evidence of hydronephrosis. No obstructing ureteral stones are seen. Stomach/Bowel: There is diffuse pneumatosis involving the cecum and ascending colon, with distention of the ascending colon to 7.4 cm in diameter. Diffuse surrounding soft tissue inflammation and fluid are seen. Bowel ischemia is a concern. There is wall thickening along the hepatic flexure of the colon, which may reflect an infectious or inflammatory process. No definite mass was seen in April. The remainder of the colon is unremarkable. The small bowel is grossly unremarkable in appearance. The stomach is unremarkable in appearance. Vascular/Lymphatic: Diffuse calcification is seen along the abdominal aorta and its branches. There is likely severe luminal narrowing at the aortic bifurcation. The inferior vena cava is grossly unremarkable. No retroperitoneal lymphadenopathy is seen. No pelvic sidewall lymphadenopathy is identified. Reproductive: The bladder is decompressed, with a Foley catheter in place. The patient is status post hysterectomy. Trace free fluid within the pelvis may reflect the colonic process. Other: No additional soft tissue abnormalities are seen. Musculoskeletal: No acute osseous abnormalities are identified. The visualized musculature is unremarkable in appearance. IMPRESSION: 1. Diffuse pneumatosis involving the cecum  and ascending colon, with distention of the ascending colon to 7.4 cm in diameter. Diffuse surrounding soft tissue inflammation and fluid seen. This is concerning for bowel ischemia. Wall thickening along the hepatic flexure of the colon may reflect an underlying infectious or inflammatory process. No definite mass was seen in April. 2. Mild hazy bibasilar airspace opacities may reflect pneumonia. 3. Diffuse calcification along the abdominal aorta and its branches, with likely severe luminal narrowing at the aortic bifurcation. 4. Scattered coronary calcifications. 5. Nonobstructing 6 mm stone at the lower pole of the left kidney. These results were called by telephone at the time of interpretation on 10/14/2017 at 4:23 am to Dr. Jola Schmidt, who verbally acknowledged these results. Electronically Signed   By: Garald Balding M.D.   On: 10/14/2017 04:24   [name] Head Wo Contrast  Result Date: 10/24/2017 CLINICAL DATA:  Left facial droop.  Stroke. EXAM: MRI HEAD WITHOUT CONTRAST MRA HEAD WITHOUT CONTRAST MRA NECK WITHOUT CONTRAST TECHNIQUE: Multiplanar, multiecho pulse sequences of the brain and surrounding structures were obtained without intravenous contrast. Angiographic images of the Circle of Willis were obtained using MRA technique without intravenous contrast. Angiographic images of the neck were obtained using MRA technique without intravenous contrast. Carotid stenosis measurements (when applicable) are obtained utilizing NASCET criteria, using the distal internal carotid diameter as the denominator. COMPARISON:  CT head 10/24/2017 FINDINGS: MRI HEAD FINDINGS Brain: Image quality degraded by motion. Acute infarcts bilaterally, left greater than right. The largest area of infarction is in the left parietal white matter. This area shows well-circumscribed hypodensity on CT suggesting this may be subacute. Additional smaller areas of acute infarct in the left temporal lobe also noted to be hypodense on CT.  Small area of acute infarct in the right frontal parietal white matter. Negative for hemorrhage. Negative for hydrocephalus. Negative for hemorrhage mass or midline shift. Vascular: Loss of flow  void in the left internal carotid artery compatible with occlusion. Normal flow void right internal carotid artery and posterior circulation. Skull and upper cervical spine: Negative Sinuses/Orbits: Paranasal sinuses clear.  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 Right internal carotid artery patent without stenosis. High-grade stenosis distal left M1 segment. Both anterior cerebral arteries patent. Right middle cerebral artery is patent with irregularity which may be due to motion Both vertebral arteries are patent. The basilar is patent. Posterior cerebral arteries patent bilaterally. 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 Right carotid artery is patent. Mild narrowing of the right carotid bifurcation. Both vertebral arteries are patent to the basilar. IMPRESSION: Acute cerebral infarction bilaterally left greater than right. Acute infarcts in the left parietal white matter and left temporal lobe show hypodensity on CT suggesting these may be subacute. Small area of acute infarct in the right frontal parietal white matter. 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 Given the acute infarcts, this may be acute. Severe stenosis left M1 Mild stenosis right carotid bifurcation. Image quality degraded by motion. These results were called by telephone at the time of interpretation on 10/24/2017 at 12:37 pm to Dr. Lake Bells, who verbally acknowledged these results. Electronically Signed   By:  Franchot Gallo M.D.   On: 10/24/2017 12:38   [name] Neck Wo Contrast  Result Date: 10/24/2017 CLINICAL DATA:  Left facial droop.  Stroke. EXAM: MRI HEAD WITHOUT CONTRAST MRA HEAD WITHOUT CONTRAST MRA NECK WITHOUT CONTRAST TECHNIQUE: Multiplanar, multiecho pulse sequences of the brain and surrounding structures were obtained without intravenous contrast. Angiographic images of the Circle of Willis were obtained using MRA technique without intravenous contrast. Angiographic images of the neck were obtained using MRA technique without intravenous contrast. Carotid stenosis measurements (when applicable) are obtained utilizing NASCET criteria, using the distal internal carotid diameter as the denominator. COMPARISON:  CT head 10/24/2017 FINDINGS: MRI HEAD FINDINGS Brain: Image quality degraded by motion. Acute infarcts bilaterally, left greater than right. The largest area of infarction is in the left parietal white matter. This area shows well-circumscribed hypodensity on CT suggesting this may be subacute. Additional smaller areas of acute infarct in the left temporal lobe also noted to be hypodense on CT. Small area of acute infarct in the right frontal parietal white matter. Negative for hemorrhage. Negative for hydrocephalus. Negative for hemorrhage mass or midline shift. Vascular: Loss of flow void in the left internal carotid artery compatible with occlusion. Normal flow void right internal carotid artery and posterior circulation. Skull and upper cervical spine: Negative Sinuses/Orbits: Paranasal sinuses clear.  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 Right internal carotid artery patent without stenosis. High-grade stenosis distal left M1 segment. Both anterior cerebral arteries patent. Right middle cerebral artery is patent with irregularity which may be due to motion  Both vertebral arteries are patent. The basilar is patent. Posterior cerebral arteries patent bilaterally. 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 Right carotid artery is patent. Mild narrowing of the right carotid bifurcation. Both vertebral arteries are patent to  the basilar. IMPRESSION: Acute cerebral infarction bilaterally left greater than right. Acute infarcts in the left parietal white matter and left temporal lobe show hypodensity on CT suggesting these may be subacute. Small area of acute infarct in the right frontal parietal white matter. 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 Given the acute infarcts, this may be acute. Severe stenosis left M1 Mild stenosis right carotid bifurcation. Image quality degraded by motion. These results were called by telephone at the time of interpretation on 10/24/2017 at 12:37 pm to Dr. Lake Bells, who verbally acknowledged these results. Electronically Signed   By: Franchot Gallo M.D.   On: 10/24/2017 12:38   Mr Brain Wo Contrast  Result Date: 10/24/2017 CLINICAL DATA:  Left facial droop.  Stroke. EXAM: MRI HEAD WITHOUT CONTRAST MRA HEAD WITHOUT CONTRAST MRA NECK WITHOUT CONTRAST TECHNIQUE: Multiplanar, multiecho pulse sequences of the brain and surrounding structures were obtained without intravenous contrast. Angiographic images of the Circle of Willis were obtained using MRA technique without intravenous contrast. Angiographic images of the neck were obtained using MRA technique without intravenous contrast. Carotid stenosis measurements (when applicable) are obtained utilizing NASCET criteria, using the distal internal carotid diameter as the denominator. COMPARISON:  CT head 10/24/2017 FINDINGS: MRI HEAD FINDINGS Brain: Image quality degraded by motion. Acute  infarcts bilaterally, left greater than right. The largest area of infarction is in the left parietal white matter. This area shows well-circumscribed hypodensity on CT suggesting this may be subacute. Additional smaller areas of acute infarct in the left temporal lobe also noted to be hypodense on CT. Small area of acute infarct in the right frontal parietal white matter. Negative for hemorrhage. Negative for hydrocephalus. Negative for hemorrhage mass or midline shift. Vascular: Loss of flow void in the left internal carotid artery compatible with occlusion. Normal flow void right internal carotid artery and posterior circulation. Skull and upper cervical spine: Negative Sinuses/Orbits: Paranasal sinuses clear.  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 Right internal carotid artery patent without stenosis. High-grade stenosis distal left M1 segment. Both anterior cerebral arteries patent. Right middle cerebral artery is patent with irregularity which may be due to motion Both vertebral arteries are patent. The basilar is patent. Posterior cerebral arteries patent bilaterally. 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 Right carotid artery is patent. Mild narrowing of the right carotid bifurcation. Both vertebral arteries are patent to the basilar. IMPRESSION: Acute cerebral infarction bilaterally left greater than right. Acute infarcts in the left parietal white matter and left temporal lobe show hypodensity on CT suggesting these may be subacute. Small area of acute infarct in the right frontal parietal white matter. 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 Given the acute  infarcts, this may be acute. Severe stenosis left M1 Mild stenosis right carotid bifurcation. Image quality degraded by motion. These results were called by telephone at the time of interpretation on 10/24/2017 at 12:37 pm to Dr. Lake Bells, who verbally acknowledged these results. Electronically Signed   By: Franchot Gallo M.D.   On: 10/24/2017 12:38   Dg Chest Port 1 View  Result Date: 11/04/2017 CLINICAL DATA:  Respiratory failure. EXAM: PORTABLE CHEST 1 VIEW COMPARISON:  11/02/2017. FINDINGS: Tracheostomy  tube and NG tube in stable position. Right PICC line noted with tip over superior vena cava. Heart size normal. Diffuse bilateral pulmonary infiltrates are noted. No pleural effusion. No pneumothorax. IMPRESSION: 1.  Lines and tubes in stable position. 2.  Bibasilar atelectasis/infiltrates noted on today's exam. Electronically Signed   By: Marcello Moores  Register   On: 11/04/2017 07:43   Dg Chest Port 1 View  Result Date: 11/02/2017 CLINICAL DATA:  72 year old female with shortness of breath. EXAM: PORTABLE CHEST 1 VIEW COMPARISON:  Chest x-ray 09/01/2018. FINDINGS: A tracheostomy tube is in place with tip 5.7 cm above the carina. Nasogastric tube extends into the antral pre-pyloric region of the stomach. Right upper extremity PICC with tip terminating in the superior cavoatrial junction. Lung volumes are slightly low. Patchy interstitial prominence most evident throughout the mid to lower left hemithorax. No confluent airspace consolidation. No pleural effusions. No evidence of pulmonary edema. Heart size is normal. Upper mediastinal contours are distorted by patient's rotation. Aortic atherosclerosis. IMPRESSION: 1. Support apparatus, as above. Patchy areas of interstitial prominence most evident in the left mid to lower hemithorax concerning for potential developing infection or aspiration pneumonitis. 2. Aortic atherosclerosis. Electronically Signed   By: Vinnie Langton M.D.   On: 11/02/2017 08:12   Dg  Chest Port 1 View  Result Date: 11/01/2017 CLINICAL DATA:  Hypoxia EXAM: PORTABLE CHEST 1 VIEW COMPARISON:  October 30, 2017 FINDINGS: Tracheostomy catheter tip is 2.9 cm above the carina. Central catheter tip is in the superior vena cava. Nasogastric tube tip and side port are below the diaphragm. No pneumothorax. There is no edema or consolidation. The heart size and pulmonary vascularity are normal. No adenopathy. There is aortic atherosclerosis. No evident bone lesions. IMPRESSION: Tube and catheter positions as described without pneumothorax. No edema or consolidation. There is aortic atherosclerosis. Aortic Atherosclerosis (ICD10-I70.0). Electronically Signed   By: Lowella Grip III M.D.   On: 11/01/2017 07:18   Dg Chest Port 1 View  Result Date: 10/30/2017 CLINICAL DATA:  Respiratory failure EXAM: PORTABLE CHEST 1 VIEW COMPARISON:  October 29, 2017 FINDINGS: Tracheostomy catheter tip is 4.0 cm above the carina. Central catheter tip is in the superior vena cava near the cavoatrial junction. Nasogastric tube tip and side port below diaphragm. No pneumothorax. There is bibasilar subsegmental atelectasis. There is no edema or consolidation. Heart is upper normal in size with pulmonary vascularity within normal limits. There is aortic atherosclerosis. No adenopathy. No bone lesions. IMPRESSION: Tube and catheter positions as described without pneumothorax. Mild bibasilar atelectasis. Stable cardiac silhouette. There is aortic atherosclerosis. Aortic Atherosclerosis (ICD10-I70.0). Electronically Signed   By: Lowella Grip III M.D.   On: 10/30/2017 09:10   Dg Chest Port 1 View  Result Date: 10/29/2017 CLINICAL DATA:  72 year old female status post enteric tube placement. EXAM: ABDOMEN - 1 VIEW; PORTABLE CHEST - 1 VIEW COMPARISON:  Radiograph dated 10/28/2017 FINDINGS: Enteric tube with side port in the distal esophagus and tip in the region of the gastroesophageal junction. Recommend advancing the  tube further into the stomach by approximately 7 cm. Tracheostomy tube above the carina. Right IJ central line with tip in the region of the cavoatrial junction. Shallow inspiration. No focal consolidation, pleural effusion, or pneumothorax. The cardiac silhouette is within normal limits. The visualized upper abdomen is grossly unremarkable. IMPRESSION: Enteric tube with side port in the distal esophagus and tip at the gastroesophageal junction. Recommend advancing the tube further into the stomach. Electronically Signed   By: Milas Hock  Radparvar M.D.   On: 10/29/2017 04:58   Dg Chest Port 1 View  Result Date: 10/28/2017 CLINICAL DATA:  Post tracheostomy tube placement today EXAM: PORTABLE CHEST 1 VIEW COMPARISON:  Portable exam 1541 hours compared to 10/28/2017 FINDINGS: New tracheostomy tube with tip projecting over tracheal air column. RIGHT arm PICC line with tip projecting over SVC. Normal heart size, mediastinal contours, and pulmonary vascularity. Atherosclerotic calcification aorta. Lungs clear. No pleural effusion or pneumothorax. IMPRESSION: No acute abnormalities. Electronically Signed   By: Lavonia Dana M.D.   On: 10/28/2017 16:03   Dg Chest Port 1 View  Result Date: 10/28/2017 CLINICAL DATA:  Respiratory failure. EXAM: PORTABLE CHEST 1 VIEW COMPARISON:  Radiograph of October 27, 2017. FINDINGS: Stable cardiomediastinal silhouette. Atherosclerosis of thoracic aorta is noted. Endotracheal and nasogastric tubes are unchanged in position. No pneumothorax is noted. Right-sided PICC line is unchanged in position. Minimal left basilar subsegmental atelectasis is noted which is improved compared to prior exam. Right lung is clear. Bony thorax is unremarkable. IMPRESSION: Stable support apparatus. Aortic atherosclerosis. Minimal left basilar subsegmental atelectasis. Electronically Signed   By: Marijo Conception, M.D.   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 Right lower quadrant abscess drain. Respiratory failure, intubated. EXAM: PORTABLE CHEST 1 VIEW COMPARISON:  Portable chest 10/26/2017, CT Abdomen and Pelvis 10/25/2017, and earlier. FINDINGS: Portable AP semi upright view at 0514 hours. More kyphotic positioning today. Stable endotracheal tube tip at the level the clavicles. Enteric tube courses to the left abdomen, tip not included. Stable right subclavian central line. New line lung volumes and mediastinal contours not significantly changed. Increased appearance of bilateral patchy lung base opacity. No pneumothorax. No definite pleural effusion. Increased pulmonary vascularity without overt edema. The Visible bowel gas pattern within normal limits. IMPRESSION: 1.  Stable lines and tubes. 2. Patchy bibasilar pulmonary opacity appears increased since 10/25/2017 and could be atelectasis or infection. Electronically Signed   By: Genevie Ann M.D.   On: 10/27/2017 07:07   Dg Chest Port 1 View  Result Date: 10/26/2017 CLINICAL DATA:  Acute respiratory failure with hypoxemia. EXAM: PORTABLE CHEST 1 VIEW COMPARISON:  One-view chest x-ray 10/25/2017. FINDINGS: Endotracheal tube terminates 4 cm above the carina, stable. Right-sided PICC line is stable. The side port of the NG tube is in the stomach. Aeration of both lungs is improved. No focal airspace disease is present. Lung volumes remain low. Atherosclerotic changes are again seen at the aortic arch. IMPRESSION: 1. Improved aeration of both lungs with persistent low lung volumes. 2. No focal airspace disease. 3. Support apparatus is stable. 4.  Aortic Atherosclerosis (ICD10-I70.0). Electronically Signed   By: San Morelle M.D.   On: 10/26/2017 06:55   Portable Chest Xray  Result Date: 10/25/2017 CLINICAL DATA:  Acute respiratory failure. EXAM: PORTABLE CHEST 1 VIEW COMPARISON:  One-view chest x-ray 10/24/2017  FINDINGS: Endotracheal tube is stable. NG tube courses off the inferior border the film. A right-sided PICC line is stable. Atherosclerotic changes are present at the aortic arch. Lung volumes are low. Mild pulmonary vascular congestion is present. No focal airspace consolidation is present. There is no pneumothorax. IMPRESSION: 1. Low lung volumes and mild atelectasis without focal airspace disease otherwise. 2. Support apparatus is stable. Electronically Signed   By: San Morelle M.D.   On: 10/25/2017 07:20   Portable Chest Xray  Result Date: 10/24/2017 CLINICAL DATA:  Ventilator support.  Stroke. EXAM: PORTABLE CHEST 1 VIEW COMPARISON:  10/22/2017 FINDINGS: Endotracheal tube tip is 3 cm above the carina. Nasogastric tube enters the abdomen. The lungs are clear with improved aeration at the bases compared to the previous study. No worsening or new finding. Right arm PICC tip at the SVC RA junction. IMPRESSION: Lines and tubes well positioned. Better aeration the lower lungs when compared to the prior study. Electronically Signed   By: Nelson Chimes M.D.   On: 10/24/2017 15:36   Dg Chest Port 1 View  Result Date: 10/22/2017 CLINICAL DATA:  Hypoxia EXAM: PORTABLE CHEST 1 VIEW COMPARISON:  October 21, 2017 FINDINGS: Endotracheal tube tip is 4.5 cm above the carina. Nasogastric tube tip and side port are below the diaphragm. Central catheter tip is in the superior vena cava near the cavoatrial junction, stable. No pneumothorax. There is a small left pleural effusion, stable. There is bibasilar atelectatic change, stable. Heart size is upper normal with pulmonary vascularity within normal limits. IMPRESSION: Tube and catheter positions as described without pneumothorax. Fairly small left pleural effusion, stable. Bibasilar atelectasis. No new opacity evident. Stable cardiac silhouette. Electronically Signed   By: Lowella Grip III M.D.   On: 10/22/2017 07:13   Dg Chest Port 1 View  Result Date:  10/21/2017 CLINICAL DATA:  Respiratory failure EXAM: PORTABLE CHEST 1 VIEW COMPARISON:  10/19/2017 FINDINGS: Cardiac shadow is stable. Aortic calcifications are again seen. The endotracheal tube and nasogastric catheter are stable. Right jugular central line is been removed and a right-sided PICC line placed in satisfactory position. Left-sided pleural effusion is identified but somewhat improved although this may be positional in nature. The right-sided effusion also appears improved. No new focal infiltrate is seen. IMPRESSION: Bilateral pleural effusions although improved from the prior exam. This may be positional in nature. Tubes and lines as described. Electronically Signed   By: Inez Catalina M.D.   On: 10/21/2017 07:19   Dg Chest Port 1 View  Result Date: 10/19/2017 CLINICAL DATA:  Endotracheal tube EXAM: PORTABLE CHEST 1 VIEW COMPARISON:  10/18/2017 FINDINGS: Endotracheal tube terminates 3.5 cm above the carina. Cardiomegaly with mild interstitial edema. Small right and moderate left pleural effusions. Associated left lower lobe opacity, likely atelectasis. Enteric tube courses into the proximal stomach. Right IJ venous catheter terminates in the cavoatrial junction. IMPRESSION: Cardiomegaly with mild interstitial edema and bilateral pleural effusions, left greater than right. Endotracheal tube terminates 3.5 cm above the carina. Additional support apparatus as above. Electronically Signed   By: Julian Hy M.D.   On: 10/19/2017 07:25   Dg Chest Port 1 View  Result Date: 10/18/2017 CLINICAL DATA:  Patient with acute respiratory failure. EXAM: PORTABLE CHEST 1 VIEW COMPARISON:  History graft 10/17/2017. FINDINGS: Patient is rotated to the right. ET tube terminates in the mid trachea. Right IJ central venous catheter tip projects over the superior vena cava. Enteric tube courses inferior to the diaphragm. Stable cardiac and mediastinal contours. Monitoring leads overlie the patient. Persistent small  layering bilateral pleural effusions and underlying opacities. No pneumothorax. IMPRESSION: Grossly unchanged layering effusions and underlying opacities favored represent atelectasis. Stable support apparatus. Electronically Signed   By: Lovey Newcomer M.D.   On: 10/18/2017 07:21   Dg Chest Port 1 View  Result Date: 10/17/2017 CLINICAL DATA:  Acute respiratory failure. EXAM: PORTABLE CHEST 1 VIEW COMPARISON:  Radiograph of October 15, 2017. FINDINGS: Stable cardiomediastinal silhouette. Endotracheal and nasogastric tubes are unchanged in  position. Right internal jugular catheter is unchanged in position. No pneumothorax is noted. Stable bibasilar infiltrates or atelectasis is noted with associated pleural effusions. Bony thorax is unremarkable. IMPRESSION: Stable support apparatus. Stable bibasilar opacities as described above. Electronically Signed   By: Marijo Conception, M.D.   On: 10/17/2017 07:12   Dg Chest Port 1 View  Result Date: 10/15/2017 CLINICAL DATA:  Endotracheal tube position EXAM: PORTABLE CHEST 1 VIEW COMPARISON:  10/14/2017 FINDINGS: Endotracheal tube in good position. Right jugular central venous catheter tip in the lower SVC unchanged. NG coiled in the stomach Progression of bibasilar airspace disease most likely atelectasis. Progression of small bilateral effusions. Increased vascular congestion likely due IMPRESSION: Endotracheal tube in good position Progression of bibasilar airspace disease and bilateral effusions. Probable fluid overload. Electronically Signed   By: Franchot Gallo M.D.   On: 10/15/2017 06:39   Dg Chest Port 1 View  Result Date: 10/14/2017 CLINICAL DATA:  Check endotracheal tube position EXAM: PORTABLE CHEST 1 VIEW COMPARISON:  Film from earlier in the same day. FINDINGS: The endotracheal tube is been withdrawn and now lies 3 cm above the carina. Nasogastric catheter is noted within the stomach. Right jugular central line is seen. The lungs are well aerated bilaterally.  Mild right basilar atelectasis is noted. IMPRESSION: Endotracheal tube in satisfactory position. Mild right basilar atelectasis. Electronically Signed   By: Inez Catalina M.D.   On: 10/14/2017 11:44   Dg Chest Portable 1 View  Result Date: 10/14/2017 CLINICAL DATA:  Acute onset of shortness of breath.  Sepsis. EXAM: PORTABLE CHEST 1 VIEW COMPARISON:  CT of the chest performed 08/18/2017 FINDINGS: The lungs are hypoexpanded but appear grossly clear. Known tiny pulmonary nodules are not well characterized on radiograph. There is no evidence of focal opacification, pleural effusion or pneumothorax. The cardiomediastinal silhouette is within normal limits. No acute osseous abnormalities are seen. IMPRESSION: Lungs hypoexpanded but grossly clear. Electronically Signed   By: Garald Balding M.D.   On: 10/14/2017 03:34   Dg Abd Portable 1v  Result Date: 11/06/2017 CLINICAL DATA:  NG tube placement EXAM: PORTABLE ABDOMEN - 1 VIEW COMPARISON:  Abdomen film of 11/03/2017 FINDINGS: NG tube tip overlies the expected position of the mid distal stomach. No bowel obstruction is seen. No opaque calculi are noted. Some splenic artery calcification is present. IMPRESSION: NG tube tip overlies the expected mid distal stomach. Electronically Signed   By: Ivar Drape M.D.   On: 11/06/2017 09:12   Dg Abd Portable 1v  Result Date: 11/03/2017 CLINICAL DATA:  Nasogastric tube placement EXAM: PORTABLE ABDOMEN - 1 VIEW COMPARISON:  Portable exam 1654 hours compared to 10/29/2017 FINDINGS: Patient slightly rotated to the RIGHT. Tip of nasogastric tube is in the RIGHT mid abdomen projecting over pylorus or distal gastric antrum. Nonobstructive bowel gas pattern. Catheter projects over pelvis question wound VAC. Bones demineralized. Scattered atherosclerotic calcifications. IMPRESSION: Tip of nasogastric tube projects over distal gastric antrum or pylorus. Electronically Signed   By: Lavonia Dana M.D.   On: 11/03/2017 17:20   Ct Image  Guided Drainage By Percutaneous Catheter  Result Date: 10/26/2017 INDICATION: Right lower quadrant abscess EXAM: CT GUIDED DRAINAGE OF  ABSCESS MEDICATIONS: The patient is currently admitted to the hospital and receiving intravenous antibiotics. The antibiotics were administered within an appropriate time frame prior to the initiation of the procedure. ANESTHESIA/SEDATION: Patient was intubated COMPLICATIONS: None immediate. TECHNIQUE: Informed written consent was obtained from the patient after a thorough discussion of the procedural  risks, benefits and alternatives. All questions were addressed. Maximal Sterile Barrier Technique was utilized including caps, mask, sterile gowns, sterile gloves, sterile drape, hand hygiene and skin antiseptic. A timeout was performed prior to the initiation of the procedure. PROCEDURE: The right lower quadrant was prepped with ChloraPrep in a sterile fashion, and a sterile drape was applied covering the operative field. A sterile gown and sterile gloves were used for the procedure. Local anesthesia was provided with 1% Lidocaine. Under CT guidance, an 18 gauge needle was inserted into the right lower quadrant abscess and removed over an Amplatz wire. Twelve [place] dilator followed by a 12 [place] drain were inserted. It was looped and string fixed then sewn to the skin. Dark fluid was aspirated. A sample was obtained and sent for culture. FINDINGS: Imaging documents placement of a 3 French drain into a right lower quadrant abscess. IMPRESSION: Successful right lower quadrant 12 French abscess drain placement. Electronically Signed   By: Marybelle Killings M.D.   On: 10/26/2017 12:43   Ct Head Code Stroke Wo Contrast`  Result Date: 10/24/2017 CLINICAL DATA:  Code stroke.  Left facial droop EXAM: CT HEAD WITHOUT CONTRAST TECHNIQUE: Contiguous axial images were obtained from the base of the skull through the vertex without intravenous contrast. COMPARISON:  None. FINDINGS: Brain:  Ventricle size normal. Mild atrophy. Chronic infarct left parietal white matter. Probable chronic subinsular infarct on the left. Negative for acute infarct.  Negative for hemorrhage or mass. Vascular: Negative for hyperdense vessel.  Atherosclerotic disease. Skull: Negative Sinuses/Orbits: Negative sinuses.  Bilateral cataract removal. Other: None ASPECTS ([place] Stroke Program Early CT Score) - Ganglionic level infarction (caudate, lentiform nuclei, internal capsule, insula, M1-M3 cortex): 7 - Supraganglionic infarction (M4-M6 cortex): 3 Total score (0-10 with 10 being normal): 10 IMPRESSION: 1. No acute abnormality 2. ASPECTS is 10 3. These results were called by telephone at the time of interpretation on 10/24/2017 at 11:11 am to Dr. Simonne Maffucci , who verbally acknowledged these results. Electronically Signed   By: Franchot Gallo M.D.   On: 10/24/2017 11:11    Microbiology: Recent Results (from the past 240 hour(s))  Culture, respiratory (NON-Expectorated)     Status: None (Preliminary result)   Collection Time: 11/02/17 11:46 AM  Result Value Ref Range Status   Specimen Description TRACHEAL ASPIRATE  Final   Special Requests Immunocompromised  Final   Gram Stain   Final    ABUNDANT WBC PRESENT, PREDOMINANTLY PMN RARE SQUAMOUS EPITHELIAL CELLS PRESENT FEW BUDDING YEAST SEEN Performed at [facility], [address], [place], [place] [address]    Culture FEW CANDIDA ALBICANS MODERATE CANDIDA GLABRATA   Final   Report Status PENDING  Incomplete  Culture, blood (Routine X 2) w Reflex to ID Panel     Status: None (Preliminary result)   Collection Time: 11/02/17 12:15 PM  Result Value Ref Range Status   Specimen Description BLOOD LEFT HAND  Final   Special Requests   Final    BOTTLES DRAWN AEROBIC AND ANAEROBIC Blood Culture adequate volume   Culture   Final    NO GROWTH 3 DAYS Performed at [facility], [address], [place], [place] [address]    Report Status PENDING   Incomplete  Culture, blood (Routine X 2) w Reflex to ID Panel     Status: None (Preliminary result)   Collection Time: 11/02/17 12:41 PM  Result Value Ref Range Status   Specimen Description BLOOD LEFT HAND  Final  Special Requests IN PEDIATRIC BOTTLE Blood Culture adequate volume  Final   Culture   Final    NO GROWTH 3 DAYS Performed at [facility], [address], [place], [place] [address]    Report Status PENDING  Incomplete     Labs: Basic Metabolic Panel: Recent Labs  Lab 10/31/17 0700 11/01/17 0308 11/02/17 1146 11/03/17 0541 11/04/17 0500 11/05/17 0500 11/06/17 0447  NA 149* 151* 148* 148* 146* 139 136  K 3.6 4.0 3.8 3.7 3.3* 2.7* 3.2*  CL 114* 114* 114* 114* 111 102 99*  CO2 28 31 29 28 28 29 30   GLUCOSE 129* 173* 173* 148* 156* 118* 128*  BUN 44* 42* 49* 48* 39* 32* 28*  CREATININE 0.78 0.96 1.34* 1.35* 1.21* 0.88 0.96  CALCIUM 8.3* 8.3* 8.1* 8.2* 7.9* 7.8* 8.0*  MG 1.9 1.7  --  2.0 1.7  --  1.7  PHOS  --   --   --  3.7 3.8  --   --    Liver Function Tests: Recent Labs  Lab 11/02/17 1146 11/05/17 0500  AST 143* 47*  ALT 132* 70*  ALKPHOS 253* 170*  BILITOT 0.5 0.3  PROT 5.7* 5.2*  ALBUMIN 1.9* 1.9*   No results for input(s): LIPASE, AMYLASE in the last 168 hours. No results for input(s): AMMONIA in the last 168 hours. CBC: Recent Labs  Lab 11/02/17 1146 11/03/17 0541 11/04/17 0500 11/05/17 0500 11/06/17 0447  WBC 8.1 9.0 8.2 8.5 9.0  NEUTROABS 4.4  --   --   --   --   HGB 7.6* 7.6* 7.1* 7.3* 7.4*  HCT 23.7* 24.0* 22.9* 22.8* 22.9*  MCV 90.5 90.9 91.2 90.5 89.8  PLT 353 320 283 269 263   Cardiac Enzymes: No results for input(s): CKTOTAL, CKMB, CKMBINDEX, TROPONINI in the last 168 hours. BNP: BNP (last 3 results) No results for input(s): BNP in the last 8760 hours.  ProBNP (last 3 results) No results for input(s): PROBNP in the last 8760 hours.  CBG: Recent Labs  Lab 11/05/17 1941 11/05/17 2312 11/06/17 0323 11/06/17 0742  11/06/17 1206  GLUCAP 166* 116* 142* 129* 122*       Signed:  Florencia Reasons MD, PhD  [facility] 11/06/2017, 3:19 PM

## 2017-11-06 NOTE — Progress Notes (Signed)
PHARMACIST - PHYSICIAN COMMUNICATION  DR:   Erlinda Hong  CONCERNING: IV to Oral Route Change Policy  RECOMMENDATION: This patient is receiving Pepcid by the intravenous route.  Based on criteria approved by the Pharmacy and Therapeutics Committee, the intravenous medication(s) is/are being converted to the equivalent oral dose form(s).  Zantac 150mg  via tube q12h   DESCRIPTION: These criteria include:  The patient is eating (either orally or via tube) and/or has been taking other orally administered medications for a least 24 hours  The patient has no evidence of active gastrointestinal bleeding or impaired GI absorption (gastrectomy, short bowel, patient on TNA or NPO).  If you have questions about this conversion, please contact the Pharmacy Department  []   [phone] )  [facility] []   [phone] )  [facility] []   [phone] )  [facility] []   [phone] )  [facility] [x]   [phone] )  [place], PharmD, BCPS Pager: [phone] 11/06/2017 7:15 AM

## 2017-11-06 NOTE — Progress Notes (Signed)
Physical Therapy Treatment Patient Details Name: [name] MRN: [id number] DOB: [birth year] Today's Date: 11/06/2017    History of Present Illness 72 yo female admitted 10/14/17 with worsening abdominal pain. S/P EXPLORATORY LAPAROTOMY, EXTENDED RIGHT COLECTOMY; APPLICATION OF ABDOMINAL VACUUM DRESSING , extubated 10/22/17.  Pt experienced CVA and was reinubated - Pt now extubated and on trach colllar    PT Comments    Pt is progressing with mobility and activity tolerance. She was able to sit EOB for at least 5 minutes with Min guard assist. Mod assist +2 for supine<>sit.  No recliner available in room and pt possibly set to d/c today. Deferred OOB and assisted back into supine. Fluctuating O2 sat reading during session-suspect sensor is not picking up signal well. Starting vitals: 72 bpm, trach O2 83%, RR14 (pt not in any distress). End vitals: 73 bpm, 80% trach O2, RR 16 (pt not in any distress).    Follow Up Recommendations  LTACH;SNF     Equipment Recommendations  (TBD at next venue)    Recommendations for Other Services       Precautions / Restrictions Precautions Precautions: Fall Precaution Comments: abd VAC; weak; trach O2 Restrictions Weight Bearing Restrictions: No    Mobility  Bed Mobility Overal bed mobility: Needs Assistance Bed Mobility: Rolling;Supine to Sit;Sit to Supine Rolling: Mod assist   Supine to sit: Mod assist;+2 for physical assistance;+2 for safety/equipment;HOB elevated Sit to supine: Mod assist;+2 for physical assistance;+2 for safety/equipment;HOB elevated   General bed mobility comments: Roll to R side-Min assist. Roll to L side-Mod assist. Assist for trunk and LEs. Utilized bedpad for scooting, positioning. Pt sat EOB for at least 5 minutes with Min guard assist.   Transfers                 General transfer comment: NT-no recliner available in room  Ambulation/Gait                 Stairs            Wheelchair  Mobility    Modified Rankin (Stroke Patients Only)       Balance Overall balance assessment: Needs assistance Sitting-balance support: Feet unsupported Sitting balance-Leahy Scale: Fair Sitting balance - Comments: Pt was able to sit with Min guard assist.                                     Cognition Arousal/Alertness: Awake/alert   Overall Cognitive Status: Difficult to assess                                        Exercises      General Comments        Pertinent Vitals/Pain Pain Assessment: Faces Faces Pain Scale: Hurts little more Pain Location: abdomen Pain Descriptors / Indicators: Sore Pain Intervention(s): Monitored during session;Repositioned    Home Living                      Prior Function            PT Goals (current goals can now be found in the care plan section) Progress towards PT goals: Progressing toward goals    Frequency    Min 2X/week      PT Plan Current plan remains appropriate    Co-evaluation  AM-PAC PT "6 Clicks" Daily Activity  Outcome Measure  Difficulty turning over in bed (including adjusting bedclothes, sheets and blankets)?: Unable Difficulty moving from lying on back to sitting on the side of the bed? : Unable Difficulty sitting down on and standing up from a chair with arms (e.g., wheelchair, bedside commode, etc,.)?: Unable Help needed moving to and from a bed to chair (including a wheelchair)?: Total Help needed walking in hospital room?: Total Help needed climbing 3-5 steps with a railing? : Total 6 Click Score: 6    End of Session   Activity Tolerance: Patient tolerated treatment well Patient left: in bed;with call bell/phone within reach;with bed alarm set   PT Visit Diagnosis: Muscle weakness (generalized) (M62.81);Other abnormalities of gait and mobility (R26.89);Difficulty in walking, not elsewhere classified (R26.2)     Time: 8811-0315 PT  Time Calculation (min) (ACUTE ONLY): 27 min  Charges:  $Therapeutic Activity: 23-37 mins                    G Codes:          Weston Anna, MPT Pager: [phone]

## 2017-11-06 NOTE — Progress Notes (Signed)
Date: November 06, 2017 Velva Harman, BSN, [facility], [place] Chart and notes review for patient progress and needs. Will follow for case management and discharge needs. No cm or discharge needs present at time of this review. Next review date: [id number]

## 2017-11-06 NOTE — Progress Notes (Signed)
[facility] ADULT ICU REPLACEMENT PROTOCOL FOR AM LAB REPLACEMENT ONLY  The patient does apply for the [facility] Adult ICU Electrolyte Replacment Protocol based on the criteria listed below:   1. Is GFR >/= 40 ml/min? Yes.    Patient's GFR today is 59 2. Is urine output >/= 0.5 ml/kg/hr for the last 6 hours? Yes.   Patient's UOP is .8 ml/kg/hr 3. Is BUN < 60 mg/dL? Yes.    Patient's BUN today is 38 4. Abnormal electrolyte(s): K 3.2 5. Ordered repletion with: per protocol 6. If a panic level lab has been reported, has the CCM MD in charge been notified? Yes.  .   Physician:  Dr. Ethlyn Gallery, Philis Nettle 11/06/2017 5:45 AM

## 2017-11-07 ENCOUNTER — Other Ambulatory Visit (HOSPITAL_COMMUNITY): Payer: Self-pay

## 2017-11-07 LAB — COMPREHENSIVE METABOLIC PANEL
ALK PHOS: 162 U/L — AB (ref 38–126)
ALT: 45 U/L (ref 14–54)
ANION GAP: 11 (ref 5–15)
AST: 35 U/L (ref 15–41)
Albumin: 2 g/dL — ABNORMAL LOW (ref 3.5–5.0)
BUN: 21 mg/dL — ABNORMAL HIGH (ref 6–20)
CALCIUM: 8 mg/dL — AB (ref 8.9–10.3)
CO2: 27 mmol/L (ref 22–32)
CREATININE: 0.87 mg/dL (ref 0.44–1.00)
Chloride: 98 mmol/L — ABNORMAL LOW (ref 101–111)
Glucose, Bld: 113 mg/dL — ABNORMAL HIGH (ref 65–99)
Potassium: 3.3 mmol/L — ABNORMAL LOW (ref 3.5–5.1)
SODIUM: 136 mmol/L (ref 135–145)
TOTAL PROTEIN: 5.4 g/dL — AB (ref 6.5–8.1)
Total Bilirubin: 0.6 mg/dL (ref 0.3–1.2)

## 2017-11-07 LAB — CBC
HCT: 26.3 % — ABNORMAL LOW (ref 36.0–46.0)
HEMOGLOBIN: 8.3 g/dL — AB (ref 12.0–15.0)
MCH: 29.3 pg (ref 26.0–34.0)
MCHC: 31.6 g/dL (ref 30.0–36.0)
MCV: 92.9 fL (ref 78.0–100.0)
PLATELETS: 311 10*3/uL (ref 150–400)
RBC: 2.83 MIL/uL — ABNORMAL LOW (ref 3.87–5.11)
RDW: 18.1 % — ABNORMAL HIGH (ref 11.5–15.5)
WBC: 9.1 10*3/uL (ref 4.0–10.5)

## 2017-11-07 LAB — PROTIME-INR
INR: 1.14
Prothrombin Time: 14.5 seconds (ref 11.4–15.2)

## 2017-11-08 ENCOUNTER — Other Ambulatory Visit (HOSPITAL_COMMUNITY): Payer: Self-pay

## 2017-11-08 LAB — CULTURE, RESPIRATORY W GRAM STAIN

## 2017-11-08 LAB — CULTURE, BLOOD (ROUTINE X 2)
CULTURE: NO GROWTH
Culture: NO GROWTH
Special Requests: ADEQUATE
Special Requests: ADEQUATE

## 2017-11-08 LAB — CULTURE, RESPIRATORY

## 2017-11-10 LAB — BASIC METABOLIC PANEL
Anion gap: 11 (ref 5–15)
BUN: 7 mg/dL (ref 6–20)
CALCIUM: 8.2 mg/dL — AB (ref 8.9–10.3)
CO2: 28 mmol/L (ref 22–32)
CREATININE: 0.81 mg/dL (ref 0.44–1.00)
Chloride: 99 mmol/L — ABNORMAL LOW (ref 101–111)
GFR calc non Af Amer: >60 mL/min (ref >60)
GLUCOSE: 135 mg/dL — AB (ref 65–99)
Potassium: 2.6 mmol/L — CL (ref 3.5–5.1)
Sodium: 138 mmol/L (ref 135–145)

## 2017-11-11 LAB — POTASSIUM: Potassium: 3.7 mmol/L (ref 3.5–5.1)

## 2017-11-12 ENCOUNTER — Other Ambulatory Visit (HOSPITAL_COMMUNITY): Payer: Self-pay

## 2017-11-16 LAB — BASIC METABOLIC PANEL
ANION GAP: 16 — AB (ref 5–15)
BUN: 25 mg/dL — ABNORMAL HIGH (ref 6–20)
CO2: 38 mmol/L — ABNORMAL HIGH (ref 22–32)
Calcium: 9.1 mg/dL (ref 8.9–10.3)
Chloride: 75 mmol/L — ABNORMAL LOW (ref 101–111)
Creatinine, Ser: 1.63 mg/dL — ABNORMAL HIGH (ref 0.44–1.00)
GFR calc Af Amer: 36 mL/min — ABNORMAL LOW (ref >60)
GFR, EST NON AFRICAN AMERICAN: 31 mL/min — AB (ref >60)
GLUCOSE: 104 mg/dL — AB (ref 65–99)
POTASSIUM: 2.8 mmol/L — AB (ref 3.5–5.1)
SODIUM: 129 mmol/L — AB (ref 135–145)

## 2017-11-16 LAB — URINALYSIS, ROUTINE W REFLEX MICROSCOPIC
Bilirubin Urine: NEGATIVE
GLUCOSE, UA: NEGATIVE mg/dL
HGB URINE DIPSTICK: NEGATIVE
KETONES UR: NEGATIVE mg/dL
NITRITE: NEGATIVE
PROTEIN: NEGATIVE mg/dL
Specific Gravity, Urine: 1.009 (ref 1.005–1.030)
pH: 5 (ref 5.0–8.0)

## 2017-11-16 LAB — CBC
HEMATOCRIT: 31.6 % — AB (ref 36.0–46.0)
HEMOGLOBIN: 10.2 g/dL — AB (ref 12.0–15.0)
MCH: 29.7 pg (ref 26.0–34.0)
MCHC: 32.3 g/dL (ref 30.0–36.0)
MCV: 91.9 fL (ref 78.0–100.0)
Platelets: 335 10*3/uL (ref 150–400)
RBC: 3.44 MIL/uL — ABNORMAL LOW (ref 3.87–5.11)
RDW: 18.1 % — ABNORMAL HIGH (ref 11.5–15.5)
WBC: 16.6 10*3/uL — AB (ref 4.0–10.5)

## 2017-11-17 ENCOUNTER — Encounter: Payer: Medicare Other | Admitting: Gastroenterology

## 2017-11-17 ENCOUNTER — Other Ambulatory Visit (HOSPITAL_COMMUNITY): Payer: Self-pay

## 2017-11-17 LAB — URINE CULTURE: CULTURE: NO GROWTH

## 2017-11-18 LAB — CULTURE, RESPIRATORY W GRAM STAIN: Culture: NORMAL

## 2017-11-19 LAB — BASIC METABOLIC PANEL
Anion gap: 17 — ABNORMAL HIGH (ref 5–15)
BUN: 17 mg/dL (ref 6–20)
CO2: 31 mmol/L (ref 22–32)
Calcium: 8.8 mg/dL — ABNORMAL LOW (ref 8.9–10.3)
Chloride: 82 mmol/L — ABNORMAL LOW (ref 101–111)
Creatinine, Ser: 1.04 mg/dL — ABNORMAL HIGH (ref 0.44–1.00)
GFR calc Af Amer: >60 mL/min (ref >60)
GFR, EST NON AFRICAN AMERICAN: 53 mL/min — AB (ref >60)
Glucose, Bld: 98 mg/dL (ref 65–99)
POTASSIUM: 2.9 mmol/L — AB (ref 3.5–5.1)
SODIUM: 130 mmol/L — AB (ref 135–145)

## 2017-11-19 LAB — CBC
HCT: 28.4 % — ABNORMAL LOW (ref 36.0–46.0)
Hemoglobin: 9.2 g/dL — ABNORMAL LOW (ref 12.0–15.0)
MCH: 29.7 pg (ref 26.0–34.0)
MCHC: 32.4 g/dL (ref 30.0–36.0)
MCV: 91.6 fL (ref 78.0–100.0)
PLATELETS: 266 10*3/uL (ref 150–400)
RBC: 3.1 MIL/uL — AB (ref 3.87–5.11)
RDW: 17.9 % — AB (ref 11.5–15.5)
WBC: 12.8 10*3/uL — AB (ref 4.0–10.5)

## 2017-11-20 LAB — POTASSIUM: Potassium: 3.4 mmol/L — ABNORMAL LOW (ref 3.5–5.1)

## 2017-11-21 ENCOUNTER — Other Ambulatory Visit (HOSPITAL_COMMUNITY): Payer: Self-pay

## 2017-11-21 LAB — CULTURE, BLOOD (ROUTINE X 2)
CULTURE: NO GROWTH
CULTURE: NO GROWTH
SPECIAL REQUESTS: ADEQUATE
SPECIAL REQUESTS: ADEQUATE

## 2017-11-21 LAB — POTASSIUM: Potassium: 4.1 mmol/L (ref 3.5–5.1)

## 2017-11-21 MED ORDER — BARIUM SULFATE 2.1 % PO SUSP
ORAL | Status: AC
  Filled 2017-11-21: qty 1

## 2017-11-21 MED ORDER — IOPAMIDOL (ISOVUE-300) INJECTION 61%
INTRAVENOUS | Status: AC
  Filled 2017-11-21: qty 30

## 2017-11-21 MED ORDER — IOPAMIDOL (ISOVUE-300) INJECTION 61%
INTRAVENOUS | Status: AC
  Administered 2017-11-21: 100 mL
  Filled 2017-11-21: qty 100

## 2017-11-24 LAB — BASIC METABOLIC PANEL
ANION GAP: 15 (ref 5–15)
BUN: 14 mg/dL (ref 6–20)
CO2: 27 mmol/L (ref 22–32)
CREATININE: 0.82 mg/dL (ref 0.44–1.00)
Calcium: 8.5 mg/dL — ABNORMAL LOW (ref 8.9–10.3)
Chloride: 93 mmol/L — ABNORMAL LOW (ref 101–111)
GFR calc Af Amer: >60 mL/min (ref >60)
GFR calc non Af Amer: >60 mL/min (ref >60)
GLUCOSE: 92 mg/dL (ref 65–99)
Potassium: 3 mmol/L — ABNORMAL LOW (ref 3.5–5.1)
Sodium: 135 mmol/L (ref 135–145)

## 2017-11-24 LAB — CBC
HEMATOCRIT: 28.8 % — AB (ref 36.0–46.0)
Hemoglobin: 9.3 g/dL — ABNORMAL LOW (ref 12.0–15.0)
MCH: 30 pg (ref 26.0–34.0)
MCHC: 32.3 g/dL (ref 30.0–36.0)
MCV: 92.9 fL (ref 78.0–100.0)
PLATELETS: 298 10*3/uL (ref 150–400)
RBC: 3.1 MIL/uL — ABNORMAL LOW (ref 3.87–5.11)
RDW: 18.3 % — AB (ref 11.5–15.5)
WBC: 13.3 10*3/uL — AB (ref 4.0–10.5)

## 2017-11-24 NOTE — Progress Notes (Signed)
Patient ID: [name], female   DOB: 03/23/1946, 72 y.o.   MRN: [id number]   Request made for phlegmon asp/drain  CT 11/21/17: IMPRESSION: 1. Edematous thickening of the walls of the rectosigmoid colon and lower descending colon, compatible with colitis of infectious or inflammatory nature. No associated bowel obstruction. 2. Previously described fluid collection within the right upper abdomen has slightly decreased in size/thickness, overall extent grossly stable, now more phlegmon like in appearance. No drainable fluid collection appreciated. As above, the collection/phlegmon extends to the inferior margin of the right liver lobe and envelops at least the inferior margin of the gallbladder. 3. No new fluid collection or abscess like collection identified. 4. Surgical changes as above. 5. Aortic atherosclerosis.  Dr Pascal Lux has reviewed imaging. Nor asp Niel Hummer is needed  Dr Owens Shark aware

## 2017-11-25 LAB — POTASSIUM: POTASSIUM: 3.5 mmol/L (ref 3.5–5.1)

## 2017-11-28 ENCOUNTER — Inpatient Hospital Stay (HOSPITAL_COMMUNITY): Admission: RE | Admit: 2017-11-28 | Payer: Self-pay | Source: Ambulatory Visit

## 2017-11-28 ENCOUNTER — Ambulatory Visit: Payer: Self-pay | Admitting: Family

## 2017-11-28 ENCOUNTER — Encounter (HOSPITAL_COMMUNITY): Payer: Self-pay

## 2017-11-28 ENCOUNTER — Encounter (HOSPITAL_COMMUNITY): Payer: Medicare Other

## 2017-12-02 ENCOUNTER — Encounter (HOSPITAL_COMMUNITY): Payer: Medicare Other

## 2017-12-02 ENCOUNTER — Other Ambulatory Visit (HOSPITAL_COMMUNITY): Payer: Medicare Other

## 2017-12-02 ENCOUNTER — Ambulatory Visit: Payer: Medicare Other | Admitting: Family

## 2017-12-04 DIAGNOSIS — K140 Glossitis: Secondary | ICD-10-CM | POA: Insufficient documentation

## 2017-12-12 DIAGNOSIS — E11622 Type 2 diabetes mellitus with other skin ulcer: Secondary | ICD-10-CM | POA: Diagnosis not present

## 2017-12-12 DIAGNOSIS — M6281 Muscle weakness (generalized): Secondary | ICD-10-CM | POA: Diagnosis not present

## 2017-12-12 DIAGNOSIS — G4700 Insomnia, unspecified: Secondary | ICD-10-CM | POA: Diagnosis not present

## 2017-12-12 DIAGNOSIS — R1312 Dysphagia, oropharyngeal phase: Secondary | ICD-10-CM | POA: Diagnosis not present

## 2017-12-12 DIAGNOSIS — X58XXXA Exposure to other specified factors, initial encounter: Secondary | ICD-10-CM | POA: Diagnosis not present

## 2017-12-12 DIAGNOSIS — L905 Scar conditions and fibrosis of skin: Secondary | ICD-10-CM | POA: Diagnosis not present

## 2017-12-12 DIAGNOSIS — E119 Type 2 diabetes mellitus without complications: Secondary | ICD-10-CM | POA: Diagnosis not present

## 2017-12-12 DIAGNOSIS — Z91041 Radiographic dye allergy status: Secondary | ICD-10-CM | POA: Diagnosis not present

## 2017-12-12 DIAGNOSIS — R2689 Other abnormalities of gait and mobility: Secondary | ICD-10-CM | POA: Diagnosis not present

## 2017-12-12 DIAGNOSIS — L89153 Pressure ulcer of sacral region, stage 3: Secondary | ICD-10-CM | POA: Diagnosis not present

## 2017-12-12 DIAGNOSIS — I69315 Cognitive social or emotional deficit following cerebral infarction: Secondary | ICD-10-CM | POA: Diagnosis not present

## 2017-12-12 DIAGNOSIS — R41841 Cognitive communication deficit: Secondary | ICD-10-CM | POA: Diagnosis not present

## 2017-12-12 DIAGNOSIS — Z885 Allergy status to narcotic agent status: Secondary | ICD-10-CM | POA: Diagnosis not present

## 2017-12-12 DIAGNOSIS — E1151 Type 2 diabetes mellitus with diabetic peripheral angiopathy without gangrene: Secondary | ICD-10-CM | POA: Diagnosis not present

## 2017-12-12 DIAGNOSIS — S3991XA Unspecified injury of abdomen, initial encounter: Secondary | ICD-10-CM | POA: Diagnosis not present

## 2017-12-12 DIAGNOSIS — Z932 Ileostomy status: Secondary | ICD-10-CM | POA: Diagnosis not present

## 2017-12-12 DIAGNOSIS — I10 Essential (primary) hypertension: Secondary | ICD-10-CM | POA: Diagnosis not present

## 2017-12-12 DIAGNOSIS — S0180XS Unspecified open wound of other part of head, sequela: Secondary | ICD-10-CM | POA: Diagnosis not present

## 2017-12-12 DIAGNOSIS — Z9049 Acquired absence of other specified parts of digestive tract: Secondary | ICD-10-CM | POA: Diagnosis not present

## 2017-12-12 DIAGNOSIS — L89323 Pressure ulcer of left buttock, stage 3: Secondary | ICD-10-CM | POA: Diagnosis not present

## 2017-12-12 DIAGNOSIS — K140 Glossitis: Secondary | ICD-10-CM | POA: Diagnosis not present

## 2017-12-16 DIAGNOSIS — I10 Essential (primary) hypertension: Secondary | ICD-10-CM | POA: Diagnosis not present

## 2017-12-16 DIAGNOSIS — Z9049 Acquired absence of other specified parts of digestive tract: Secondary | ICD-10-CM | POA: Diagnosis not present

## 2017-12-16 DIAGNOSIS — E119 Type 2 diabetes mellitus without complications: Secondary | ICD-10-CM | POA: Diagnosis not present

## 2017-12-16 DIAGNOSIS — K140 Glossitis: Secondary | ICD-10-CM | POA: Diagnosis not present

## 2017-12-21 ENCOUNTER — Other Ambulatory Visit: Payer: Self-pay | Admitting: Gastroenterology

## 2017-12-22 DIAGNOSIS — K140 Glossitis: Secondary | ICD-10-CM | POA: Diagnosis not present

## 2017-12-22 DIAGNOSIS — I10 Essential (primary) hypertension: Secondary | ICD-10-CM | POA: Diagnosis not present

## 2017-12-22 DIAGNOSIS — Z9049 Acquired absence of other specified parts of digestive tract: Secondary | ICD-10-CM | POA: Diagnosis not present

## 2017-12-22 DIAGNOSIS — G4700 Insomnia, unspecified: Secondary | ICD-10-CM | POA: Diagnosis not present

## 2017-12-29 DIAGNOSIS — K140 Glossitis: Secondary | ICD-10-CM | POA: Diagnosis not present

## 2017-12-31 ENCOUNTER — Encounter: Payer: No Typology Code available for payment source | Attending: Nurse Practitioner | Admitting: Nurse Practitioner

## 2017-12-31 DIAGNOSIS — Z932 Ileostomy status: Secondary | ICD-10-CM | POA: Diagnosis not present

## 2017-12-31 DIAGNOSIS — S3991XA Unspecified injury of abdomen, initial encounter: Secondary | ICD-10-CM | POA: Insufficient documentation

## 2017-12-31 DIAGNOSIS — L89153 Pressure ulcer of sacral region, stage 3: Secondary | ICD-10-CM | POA: Diagnosis not present

## 2017-12-31 DIAGNOSIS — Z9049 Acquired absence of other specified parts of digestive tract: Secondary | ICD-10-CM | POA: Diagnosis not present

## 2017-12-31 DIAGNOSIS — X58XXXA Exposure to other specified factors, initial encounter: Secondary | ICD-10-CM | POA: Insufficient documentation

## 2017-12-31 DIAGNOSIS — E11622 Type 2 diabetes mellitus with other skin ulcer: Secondary | ICD-10-CM | POA: Diagnosis not present

## 2017-12-31 DIAGNOSIS — E1151 Type 2 diabetes mellitus with diabetic peripheral angiopathy without gangrene: Secondary | ICD-10-CM | POA: Insufficient documentation

## 2017-12-31 DIAGNOSIS — Z885 Allergy status to narcotic agent status: Secondary | ICD-10-CM | POA: Insufficient documentation

## 2017-12-31 DIAGNOSIS — Z91041 Radiographic dye allergy status: Secondary | ICD-10-CM | POA: Insufficient documentation

## 2018-01-01 NOTE — Progress Notes (Addendum)
LILOU, KNEIP ([id number]) Visit Report for 12/31/2017 Chief Complaint Document Details Patient Name: [name], [name]. Date of Service: 12/31/2017 3:00 PM Medical Record Number: [id number] Patient Account Number: [id number] Date of Birth/Sex: [birth year] (72 y.o. Female) Treating RN: Cornell Barman Primary Care Provider: Leanna Battles Other Clinician: Referring Provider: Janie Morning Treating Provider/Extender: Cathie Olden in Treatment: 0 Information Obtained from: Patient Chief Complaint She is here for evaluation of ileostomy complications Electronic Signature(s) Signed: 12/31/2017 9:08:39 AM By: Lawanda Cousins Entered By: Lawanda Cousins on 12/31/2017 09:08:39 [name] ([id number]) -------------------------------------------------------------------------------- HPI Details Patient Name: [name]. Date of Service: 12/31/2017 3:00 PM Medical Record Number: [id number] Patient Account Number: [id number] Date of Birth/Sex: 1945/11/04 (72 y.o. 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 She had a complicated postoperative course with respiratory failure, acute bilateral watershed cerebral infarcts, reintubation, tracheostomy, postoperative healing by secondary intention with NPWT, blood transfusion; she was treated with broad-spectrum antibiotic (meropenem, vancomycin, Zosyn, Unasyn) and antifungal (anidulafungin). She was also noted to have a sacral pressure ulcer during this admission,  treated with Santyl. She was discharged to inpatient rehabilitation, then to SNF/rehab ([place]) with plans to discharge home this weekend. She's been experiencing multiple complications with ileostomy since arriving to [facility], staff at facility unable to maintain appliance with noted peristomal skin breakdown. We discussed at length ostomy care and skin related challenges with ileostomy effluent. Her daughter-in-law accompanies her today, with multiple questions answered. She presents with a stage 3 sacral/coccyx pressure ulcer. She is ambulatory and has a foam cushion in her wheelchair. The facility has been placing collagen per the daughter-in-law. Electronic Signature(s) Signed: 12/31/2017 5:30:07 PM By: Lawanda Cousins Previous Signature: 12/31/2017 5:27:13 PM Version By: Lawanda Cousins Previous Signature: 12/31/2017 8:59:44 AM Version By: Lawanda Cousins Entered By: Lawanda Cousins on 12/31/2017 17:30:07 [name] ([id number]) -------------------------------------------------------------------------------- Physician Orders Details Patient Name: [name] Date of Service: 12/31/2017 3:00 PM Medical Record Number: [id number] Patient Account Number: [id number] Date of Birth/Sex: 1946/01/07 (72 y.o. Female) Treating RN: Montey Hora Primary Care Provider: Leanna Battles Other Clinician: Referring Provider: Janie Morning Treating Provider/Extender: Cathie Olden in Treatment: 0 Verbal / Phone Orders: No Diagnosis Coding ICD-10 Coding Code Description Z93.2 Ileostomy status Z93.0 Tracheostomy status R54 Age-related physical debility S30.92XS Unspecified superficial injury of abdominal wall, sequela Unspecified open wound of abdominal wall, unspecified quadrant without penetration into peritoneal S31.109S cavity, sequela Wound Cleansing Wound #1 Coccyx o Clean wound with Normal Saline. o May Shower, gently pat wound dry prior to applying new  dressing. Primary Wound Dressing Wound #1 Coccyx o Silvercel Non-Adherent - or silver alginate equivalent Secondary Dressing Wound #1 Coccyx o Boardered Foam Dressing Dressing Change Frequency Wound #1 Coccyx o Change dressing every day. Follow-up Appointments Wound #1 Coccyx o Return Appointment in 1 week. Electronic Signature(s) Signed: 12/31/2017 5:31:19 PM By: Lawanda Cousins Entered By: Lawanda Cousins on 12/31/2017 17:21:50 [name] ([id number]) -------------------------------------------------------------------------------- Problem List Details Patient Name: [name] Date of Service: 12/31/2017 3:00 PM Medical Record Number: [id number] Patient Account Number: [id number] Date of Birth/Sex: 03/15/1946 (72 y.o. Female) Treating RN: Cornell Barman Primary  Care Provider: Leanna Battles Other Clinician: Referring Provider: Janie Morning Treating Provider/Extender: Cathie Olden in Treatment: 0 Active Problems ICD-10 Encounter Code Description Active Date Diagnosis Z93.2 Ileostomy status 12/31/2017 Yes R54 Age-related physical debility 12/31/2017 Yes S30.92XS Unspecified superficial injury of abdominal wall, sequela 12/31/2017 Yes L89.153 Pressure ulcer of sacral region, stage 3 12/31/2017 Yes Inactive Problems Resolved Problems Electronic Signature(s) Signed: 12/31/2017 5:20:19 PM By: Lawanda Cousins Previous Signature: 12/31/2017 9:08:17 AM Version By: Lawanda Cousins Entered By: Lawanda Cousins on 12/31/2017 17:20:19 [name] ([id number]) -------------------------------------------------------------------------------- Progress Note Details Patient Name: [name]. Date of Service: 12/31/2017 3:00 PM Medical Record Number: [id number] Patient Account Number: [id number] Date of Birth/Sex: 04-Apr-1946 (72 y.o. Female) Treating RN: Cornell Barman Primary Care Provider: Leanna Battles Other Clinician: Referring Provider: Janie Morning Treating  Provider/Extender: Cathie Olden in Treatment: 0 Subjective Chief Complaint Information obtained from Patient She is here for evaluation of ileostomy complications History of Present Illness (HPI) 12/31/17 she had an extended hospitalization at [facility] from 1/1-1/24 after presenting to the hospital with acute abdominal pain and ischemic colitis, s/p exploratory laparotomy, significant small bowel resection, right colectomy, partial transverse colectomy 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 She had a complicated postoperative course with respiratory failure, acute bilateral watershed cerebral infarcts, reintubation, tracheostomy, postoperative healing by secondary intention with NPWT, blood transfusion; she was treated with broad-spectrum antibiotic (meropenem, vancomycin, Zosyn, Unasyn) and antifungal (anidulafungin). She was also noted to have a sacral pressure ulcer during this admission, treated with Santyl. She was discharged to inpatient rehabilitation, then to SNF/rehab ([place]) with plans to discharge home this weekend. She's been experiencing multiple complications with ileostomy since arriving to [facility], staff at facility unable to maintain appliance with noted peristomal skin breakdown. We discussed at length ostomy care and skin related challenges with ileostomy effluent. Her daughter-in-law accompanies her today, with multiple questions answered. She presents with a stage 3 sacral/coccyx pressure ulcer. She is ambulatory and has a foam cushion in her wheelchair. The facility has been placing collagen per the daughter-in-law. Wound History Patient presents with 1 open wound that has been present for approximately 6 weeks. Patient has been treating wound in the following manner: silver alginate. Laboratory tests have not been performed in the last month. Patient reportedly has not tested positive for an antibiotic resistant organism. Patient reportedly has not  tested positive for osteomyelitis. Patient reportedly has not had testing performed to evaluate circulation in the legs. Patient History Information obtained from Patient. Allergies codeine HCl, Iodinated Contrast- Oral and IV Dye Family History Diabetes - Father, Lung Disease - Father, Stroke - Father, No family history of Cancer, Heart Disease, Hereditary Spherocytosis, Hypertension, Kidney Disease, Seizures, Thyroid Problems, Tuberculosis. Social History Former smoker, Marital Status - Divorced, Alcohol Use - Never, Drug Use - No History, Caffeine Use - Daily. Medical History Eyes Patient has history of Cataracts - removed Respiratory Denies history of Aspiration, Asthma, Chronic Obstructive Pulmonary Disease (COPD), Pneumothorax, Sleep Apnea, Carusone, Zollie H. ([id number]) Tuberculosis Cardiovascular Patient has history of Hypertension Denies history of Angina, Arrhythmia, Congestive Heart Failure, Coronary Artery Disease, Deep Vein Thrombosis, Hypotension, Myocardial Infarction, Peripheral Arterial Disease, Peripheral Venous Disease, Phlebitis, Vasculitis Gastrointestinal Denies history of Cirrhosis , Colitis, Crohn s, Hepatitis A, Hepatitis B, Hepatitis C Endocrine Patient has history of Type II Diabetes Musculoskeletal Patient has history of Osteoarthritis Oncologic Denies history of Received Chemotherapy, Received Radiation Patient is treated with Insulin. Medical And Surgical History Notes Genitourinary ileostomy Neurologic CVA 2019 Review of  Systems (ROS) Constitutional Symptoms (General Health) The patient has no complaints or symptoms. Eyes The patient has no complaints or symptoms. Ear/Nose/Mouth/Throat The patient has no complaints or symptoms. Hematologic/Lymphatic The patient has no complaints or symptoms. Respiratory The patient has no complaints or symptoms. Cardiovascular The patient has no complaints or symptoms. Gastrointestinal The patient has no  complaints or symptoms. Endocrine The patient has no complaints or symptoms. Genitourinary The patient has no complaints or symptoms. Immunological The patient has no complaints or symptoms. Integumentary (Skin) The patient has no complaints or symptoms. Musculoskeletal The patient has no complaints or symptoms. Neurologic The patient has no complaints or symptoms. Oncologic The patient has no complaints or symptoms. Psychiatric The patient has no complaints or symptoms. [name], [name] ([id number]) Objective Constitutional Vitals Time Taken: 3:47 PM, Height: 63 in, Source: Measured, Weight: 120 lbs, Source: Measured, BMI: 21.3, Temperature: 98.2 F, Pulse: 78 bpm, Respiratory Rate: 18 breaths/min, Blood Pressure: 150/67 mmHg. Integumentary (Hair, Skin) Wound #1 status is Open. Original cause of wound was Pressure Injury. The wound is located on the Coccyx. The wound measures 3.6cm length x 2.4cm width x 0.1cm depth; 6.786cm^2 area and 0.679cm^3 volume. There is no tunneling or undermining noted. There is a medium amount of serous drainage noted. The wound margin is flat and intact. There is medium (34-66%) pink granulation within the wound bed. There is a medium (34-66%) amount of necrotic tissue within the wound bed including Adherent Slough. The periwound skin appearance did not exhibit: Callus, Crepitus, Excoriation, Induration, Rash, Scarring, Dry/Scaly, Maceration, Atrophie Blanche, Cyanosis, Ecchymosis, Hemosiderin Staining, Mottled, Pallor, Rubor, Erythema. Periwound temperature was noted as No Abnormality. Assessment Active Problems ICD-10 Z93.2 - Ileostomy status R54 - Age-related physical debility S30.92XS - Unspecified superficial injury of abdominal wall, sequela L89.153 - Pressure ulcer of sacral region, stage 3 Plan Wound Cleansing: Wound #1 Coccyx: Clean wound with Normal Saline. May Shower, gently pat wound dry prior to applying new dressing. Primary Wound  Dressing: Wound #1 Coccyx: Silvercel Non-Adherent - or silver alginate equivalent Secondary Dressing: Wound #1 Coccyx: Boardered Foam Dressing Dressing Change Frequency: Wound #1 Coccyx: Change dressing every day. Follow-up Appointments: Wound #1 Coccyx: Return Appointment in 1 week. [name], [name]. ([id number]) 1. sacral ulcer- silvercelm foam border 2. offload 3. follow up next week 4. peri-stomal skin breakdown treat with crusting technique with each appliance change Electronic Signature(s) Signed: 12/31/2017 5:34:16 PM By: Lawanda Cousins Previous Signature: 12/31/2017 5:30:32 PM Version By: Lawanda Cousins Previous Signature: 12/31/2017 5:23:07 PM Version By: Lawanda Cousins Entered By: Lawanda Cousins on 12/31/2017 17:34:16 [name] ([id number]) -------------------------------------------------------------------------------- ROS/PFSH Details Patient Name: [name]. Date of Service: 12/31/2017 3:00 PM Medical Record Number: [id number] Patient Account Number: [id number] Date of Birth/Sex: October 13, 1946 (72 y.o. Female) Treating RN: Montey Hora Primary Care Provider: Leanna Battles Other Clinician: Referring Provider: Janie Morning Treating Provider/Extender: Cathie Olden in Treatment: 0 Information Obtained From Patient Wound History Do you currently have one or more open woundso Yes How many open wounds do you currently haveo 1 Approximately how long have you had your woundso 6 weeks How have you been treating your wound(s) until nowo silver alginate Has your wound(s) ever healed and then re-openedo No Have you had any lab work done in the past montho No Have you tested positive for an antibiotic resistant organism (MRSA, VRE)o No Have you tested positive for osteomyelitis (bone infection)o No Have you had any tests for circulation on your legso No Constitutional Symptoms (General Health)  Complaints and Symptoms: No Complaints or  Symptoms Eyes Complaints and Symptoms: No Complaints or Symptoms Medical History: Positive for: Cataracts - removed Ear/Nose/Mouth/Throat Complaints and Symptoms: No Complaints or Symptoms Hematologic/Lymphatic Complaints and Symptoms: No Complaints or Symptoms Respiratory Complaints and Symptoms: No Complaints or Symptoms Medical History: Negative for: Aspiration; Asthma; Chronic Obstructive Pulmonary Disease (COPD); Pneumothorax; Sleep Apnea; Tuberculosis Cardiovascular Complaints and Symptoms: No Complaints or Symptoms Hosking, Joanna H. ([id number]) Medical History: Positive for: Hypertension Negative for: Angina; Arrhythmia; Congestive Heart Failure; Coronary Artery Disease; Deep Vein Thrombosis; Hypotension; Myocardial Infarction; Peripheral Arterial Disease; Peripheral Venous Disease; Phlebitis; Vasculitis Gastrointestinal Complaints and Symptoms: No Complaints or Symptoms Medical History: Negative for: Cirrhosis ; Colitis; Crohnos; Hepatitis A; Hepatitis B; Hepatitis C Endocrine Complaints and Symptoms: No Complaints or Symptoms Medical History: Positive for: Type II Diabetes Treated with: Insulin Genitourinary Complaints and Symptoms: No Complaints or Symptoms Medical History: Past Medical History Notes: ileostomy Immunological Complaints and Symptoms: No Complaints or Symptoms Integumentary (Skin) Complaints and Symptoms: No Complaints or Symptoms Musculoskeletal Complaints and Symptoms: No Complaints or Symptoms Medical History: Positive for: Osteoarthritis Neurologic Complaints and Symptoms: No Complaints or Symptoms Medical History: Past Medical History Notes: CVA 2019 [name], DONDLINGER. ([id number]) Oncologic Complaints and Symptoms: No Complaints or Symptoms Medical History: Negative for: Received Chemotherapy; Received Radiation Psychiatric Complaints and Symptoms: No Complaints or Symptoms HBO Extended History  Items Eyes: Cataracts Immunizations Pneumococcal Vaccine: Received Pneumococcal Vaccination: Yes Immunization Notes: up to date Implantable Devices Family and Social History Cancer: No; Diabetes: Yes - Father; Heart Disease: No; Hereditary Spherocytosis: No; Hypertension: No; Kidney Disease: No; Lung Disease: Yes - Father; Seizures: No; Stroke: Yes - Father; Thyroid Problems: No; Tuberculosis: No; Former smoker; Marital Status - Divorced; Alcohol Use: Never; Drug Use: No History; Caffeine Use: Daily; Financial Concerns: No; Food, Clothing or Shelter Needs: No; Support System Lacking: No; Transportation Concerns: No; Advanced Directives: No; Patient does not want information on Advanced Directives Electronic Signature(s) Signed: 12/31/2017 5:31:19 PM By: Lawanda Cousins Signed: 12/31/2017 5:43:37 PM By: Montey Hora Entered By: Montey Hora on 12/31/2017 15:56:19 [name] ([id number]) -------------------------------------------------------------------------------- SuperBill Details Patient Name: [name]. Date of Service: 12/31/2017 Medical Record Number: [id number] Patient Account Number: [id number] Date of Birth/Sex: 04/23/1946 (72 y.o. Female) Treating RN: Cornell Barman Primary Care Provider: Leanna Battles Other Clinician: Referring Provider: Janie Morning Treating Provider/Extender: Cathie Olden in Treatment: 0 Diagnosis Coding ICD-10 Codes Code Description Z93.2 Ileostomy status R54 Age-related physical debility S30.92XS Unspecified superficial injury of abdominal wall, sequela L89.153 Pressure ulcer of sacral region, stage 3 Facility Procedures CPT4 Code: [id number] Description: 99214 - WOUND CARE VISIT-LEV 4 EST PT Modifier: Quantity: 1 Physician Procedures CPT4 Code: [id number] Description: WC PHYS LEVEL 3 o NEW PT ICD-10 Diagnosis Description Z93.2 Ileostomy status S30.92XS Unspecified superficial injury of abdominal wall, sequel L89.153 Pressure  ulcer of sacral region, stage 3 Modifier: a Quantity: 1 Electronic Signature(s) Signed: 12/31/2017 5:33:37 PM By: Lawanda Cousins Entered By: Lawanda Cousins on 12/31/2017 17:33:37

## 2018-01-01 NOTE — Progress Notes (Addendum)
SOLIANA, KITKO ([id number]) Visit Report for 12/31/2017 Allergy List Details Patient Name: [name], [name]. Date of Service: 12/31/2017 3:00 PM Medical Record Number: [id number] Patient Account Number: [id number] Date of Birth/Sex: [birth year] (72 y.o. Female) Treating RN: Montey Hora Primary Care Dijuan Sleeth: Leanna Battles Other Clinician: Referring Haaris Metallo: Janie Morning Treating Joshiah Traynham/Extender: Cathie Olden in Treatment: 0 Allergies Active Allergies codeine HCl Iodinated Contrast- Oral and IV Dye Allergy Notes Electronic Signature(s) Signed: 12/31/2017 5:43:37 PM By: Montey Hora Entered By: Montey Hora on 12/31/2017 15:50:18 [name] ([id number]) -------------------------------------------------------------------------------- Arrival Information Details Patient Name: [name]. Date of Service: 12/31/2017 3:00 PM Medical Record Number: [id number] Patient Account Number: [id number] Date of Birth/Sex: July 03, 1946 (72 y.o. Female) Treating RN: Montey Hora Primary Care Nicoles Sedlacek: Leanna Battles Other Clinician: Referring Kolson Chovanec: Janie Morning Treating Gershom Brobeck/Extender: Cathie Olden in Treatment: 0 Visit Information Patient Arrived: Wheel Chair Arrival Time: 15:28 Accompanied By: dtr Transfer Assistance: None Patient Identification Verified: Yes Secondary Verification Process Completed: Yes Patient Has Alerts: Yes Patient Alerts: DMII Electronic Signature(s) Signed: 12/31/2017 5:43:37 PM By: Montey Hora Entered By: Montey Hora on 12/31/2017 15:46:23 [name] ([id number]) -------------------------------------------------------------------------------- Clinic Level of Care Assessment Details Patient Name: [name] Date of Service: 12/31/2017 3:00 PM Medical Record Number: [id number] Patient Account Number: [id number] Date of Birth/Sex: 1945/12/30 (72 y.o. Female) Treating RN: Montey Hora Primary Care Alejos Reinhardt:  Leanna Battles Other Clinician: Referring Calena Salem: Janie Morning Treating Felicidad Sugarman/Extender: Cathie Olden in Treatment: 0 Clinic Level of Care Assessment Items TOOL 2 Quantity Score []  - Use when only an EandM is performed on the INITIAL visit 0 ASSESSMENTS - Nursing Assessment / Reassessment X - General Physical Exam (combine w/ comprehensive assessment (listed just below) when 1 20 performed on new pt. evals) X- 1 25 Comprehensive Assessment (HX, ROS, Risk Assessments, Wounds Hx, etc.) ASSESSMENTS - Wound and Skin Assessment / Reassessment X - Simple Wound Assessment / Reassessment - one wound 1 5 []  - 0 Complex Wound Assessment / Reassessment - multiple wounds X- 1 10 Dermatologic / Skin Assessment (not related to wound area) ASSESSMENTS - Ostomy and/or Continence Assessment and Care []  - Incontinence Assessment and Management 0 X- 1 20 Ostomy Care Assessment and Management (repouching, etc.) PROCESS - Coordination of Care X - Simple Patient / Family Education for ongoing care 1 15 []  - 0 Complex (extensive) Patient / Family Education for ongoing care []  - 0 Staff obtains Programmer, systems, Records, Test Results / Process Orders []  - 0 Staff telephones HHA, Nursing Homes / Clarify orders / etc []  - 0 Routine Transfer to another Facility (non-emergent condition) []  - 0 Routine Hospital Admission (non-emergent condition) X- 1 15 New Admissions / Biomedical engineer / Ordering NPWT, Apligraf, etc. []  - 0 Emergency Hospital Admission (emergent condition) X- 1 10 Simple Discharge Coordination []  - 0 Complex (extensive) Discharge Coordination PROCESS - Special Needs []  - Pediatric / Minor Patient Management 0 []  - 0 Isolation Patient Management RHIANNON, SASSAMAN. ([id number]) []  - 0 Hearing / Language / Visual special needs []  - 0 Assessment of Community assistance (transportation, D/C planning, etc.) []  - 0 Additional assistance / Altered mentation []  -  0 Support Surface(s) Assessment (bed, cushion, seat, etc.) INTERVENTIONS - Wound Cleansing / Measurement X - Wound Imaging (photographs - any number of wounds) 1 5 []  - 0 Wound Tracing (instead of photographs) X- 1 5 Simple Wound Measurement - one wound []  - 0 Complex Wound Measurement - multiple wounds X- 1 5 Simple  Wound Cleansing - one wound []  - 0 Complex Wound Cleansing - multiple wounds INTERVENTIONS - Wound Dressings X - Small Wound Dressing one or multiple wounds 1 10 []  - 0 Medium Wound Dressing one or multiple wounds []  - 0 Large Wound Dressing one or multiple wounds []  - 0 Application of Medications - injection INTERVENTIONS - Miscellaneous []  - External ear exam 0 []  - 0 Specimen Collection (cultures, biopsies, blood, body fluids, etc.) []  - 0 Specimen(s) / Culture(s) sent or taken to Lab for analysis []  - 0 Patient Transfer (multiple staff / Civil Service fast streamer / Similar devices) []  - 0 Simple Staple / Suture removal (25 or less) []  - 0 Complex Staple / Suture removal (26 or more) []  - 0 Hypo / Hyperglycemic Management (close monitor of Blood Glucose) []  - 0 Ankle / Brachial Index (ABI) - do not check if billed separately Has the patient been seen at the hospital within the last three years: Yes Total Score: 145 Level Of Care: New/Established - Level 4 Electronic Signature(s) Signed: 12/31/2017 5:43:37 PM By: Montey Hora Entered By: Montey Hora on 12/31/2017 17:18:29 [name] ([id number]) -------------------------------------------------------------------------------- Encounter Discharge Information Details Patient Name: [name]. Date of Service: 12/31/2017 3:00 PM Medical Record Number: [id number] Patient Account Number: [id number] Date of Birth/Sex: 05/03/1946 (72 y.o. Female) Treating RN: Montey Hora Primary Care Rocklin Soderquist: Leanna Battles Other Clinician: Referring Olivene Cookston: Janie Morning Treating Sidonia Nutter/Extender: Cathie Olden in Treatment: 0 Encounter Discharge Information Items Discharge Pain Level: 0 Discharge Condition: Stable Ambulatory Status: Wheelchair Nursing Discharge Destination: Home Transportation: Private Auto Accompanied By: dtr Schedule Follow-up Appointment: Yes Medication Reconciliation completed and No provided to Patient/Care Littleton Haub: Clinical Summary of Care: Electronic Signature(s) Signed: 12/31/2017 5:19:19 PM By: Montey Hora Entered By: Montey Hora on 12/31/2017 17:19:19 [name] ([id number]) -------------------------------------------------------------------------------- Multi Wound Chart Details Patient Name: [name]. Date of Service: 12/31/2017 3:00 PM Medical Record Number: [id number] Patient Account Number: [id number] Date of Birth/Sex: [birth year] (72 y.o. Female) Treating RN: Montey Hora Primary Care Gilbert Manolis: Leanna Battles Other Clinician: Referring Noah Pelaez: Janie Morning Treating Kelvin Sennett/Extender: Cathie Olden in Treatment: 0 Vital Signs Height(in): 63 Pulse(bpm): 62 Weight(lbs): 120 Blood Pressure(mmHg): 150/67 Body Mass Index(BMI): 21 Temperature(F): 98.2 Respiratory Rate 18 (breaths/min): Photos: [N/A:N/A] Wound Location: Coccyx N/A N/A Wounding Event: Pressure Injury N/A N/A Primary Etiology: Pressure Ulcer N/A N/A Comorbid History: Cataracts, Hypertension, Type N/A N/A II Diabetes, Osteoarthritis Date Acquired: 10/20/2017 N/A N/A Weeks of Treatment: 0 N/A N/A Wound Status: Open N/A N/A Measurements L x W x D 3.6x2.4x0.1 N/A N/A (cm) Area (cm) : 6.786 N/A N/A Volume (cm) : 0.679 N/A N/A % Reduction in Area: 0.00% N/A N/A % Reduction in Volume: 0.00% N/A N/A Classification: Category/Stage III N/A N/A Exudate Amount: Medium N/A N/A Exudate Type: Serous N/A N/A Exudate Color: amber N/A N/A Wound Margin: Flat and Intact N/A N/A Granulation Amount: Medium (34-66%) N/A N/A Granulation Quality: Pink  N/A N/A Necrotic Amount: Medium (34-66%) N/A N/A Exposed Structures: Fascia: No N/A N/A Fat Layer (Subcutaneous Tissue) Exposed: No Tendon: No Muscle: No Joint: No Bone: No Misch, Moncerrath H. ([id number]) Epithelialization: Small (1-33%) N/A N/A Periwound Skin Texture: Excoriation: No N/A N/A Induration: No Callus: No Crepitus: No Rash: No Scarring: No Periwound Skin Moisture: Maceration: No N/A N/A Dry/Scaly: No Periwound Skin Color: Atrophie Blanche: No N/A N/A Cyanosis: No Ecchymosis: No Erythema: No Hemosiderin Staining: No Mottled: No Pallor: No Rubor: No Temperature: No Abnormality N/A N/A Tenderness on Palpation: No  N/A N/A Wound Preparation: Ulcer Cleansing: N/A N/A Rinsed/Irrigated with Saline Topical Anesthetic Applied: None Treatment Notes Wound #1 (Coccyx) 1. Cleansed with: Clean wound with Normal Saline 2. Anesthetic Topical Lidocaine 4% cream to wound bed prior to debridement 4. Dressing Applied: Other dressing (specify in notes) 5. Secondary Dressing Applied Bordered Foam Dressing Notes silvercel Electronic Signature(s) Signed: 12/31/2017 5:21:31 PM By: Lawanda Cousins Previous Signature: 12/31/2017 5:17:29 PM Version By: Montey Hora Entered By: Lawanda Cousins on 12/31/2017 17:21:31 [name] ([id number]) -------------------------------------------------------------------------------- [place] Details Patient Name: [name] Date of Service: 12/31/2017 3:00 PM Medical Record Number: [id number] Patient Account Number: [id number] Date of Birth/Sex: [birth year] (72 y.o. Female) Treating RN: Montey Hora Primary Care Heyden Jaber: Leanna Battles Other Clinician: Referring Hildur Bayer: Janie Morning Treating Devun Anna/Extender: Cathie Olden in Treatment: 0 Active Inactive ` Abuse / Safety / Falls / Self Care Management Nursing Diagnoses: Impaired physical mobility Goals: Patient will not experience any injury  related to falls Date Initiated: 12/31/2017 Target Resolution Date: 03/21/2018 Goal Status: Active Interventions: Assess fall risk on admission and as needed Notes: ` Orientation to the Wound Care Program Nursing Diagnoses: Knowledge deficit related to the wound healing center program Goals: Patient/caregiver will verbalize understanding of the [place] Date Initiated: 12/31/2017 Target Resolution Date: 03/21/2018 Goal Status: Active Interventions: Provide education on orientation to the wound center Notes: ` Wound/Skin Impairment Nursing Diagnoses: Impaired tissue integrity Goals: Ulcer/skin breakdown will heal within 14 weeks Date Initiated: 12/31/2017 Target Resolution Date: 01/17/2018 Goal Status: Active Interventions: BRAELYNNE, GARINGER ([id number]) Assess patient/caregiver ability to obtain necessary supplies Assess patient/caregiver ability to perform ulcer/skin care regimen upon admission and as needed Assess ulceration(s) every visit Notes: Electronic Signature(s) Signed: 12/31/2017 5:17:17 PM By: Montey Hora Entered By: Montey Hora on 12/31/2017 17:17:17 [name] ([id number]) -------------------------------------------------------------------------------- Pain Assessment Details Patient Name: [name]. Date of Service: 12/31/2017 3:00 PM Medical Record Number: [id number] Patient Account Number: [id number] Date of Birth/Sex: 1946/06/22 (72 y.o. Female) Treating RN: Montey Hora Primary Care Kannon Baum: Leanna Battles Other Clinician: Referring Anisha Starliper: Janie Morning Treating Derry Arbogast/Extender: Cathie Olden in Treatment: 0 Active Problems Location of Pain Severity and Description of Pain Patient Has Paino Yes Site Locations Character of Pain Describe the Pain: Burning Pain Management and Medication Current Pain Management: Notes skin surrounding ileostomy Electronic Signature(s) Signed: 12/31/2017 5:43:37 PM By:  Montey Hora Entered By: Montey Hora on 12/31/2017 15:47:00 [name] ([id number]) -------------------------------------------------------------------------------- Patient/Caregiver Education Details Patient Name: [name] Date of Service: 12/31/2017 3:00 PM Medical Record Number: [id number] Patient Account Number: [id number] Date of Birth/Gender: 09-07-1946 (72 y.o. Female) Treating RN: Montey Hora Primary Care Physician: Leanna Battles Other Clinician: Referring Physician: Janie Morning Treating Physician/Extender: Cathie Olden in Treatment: 0 Education Assessment Education Provided To: Patient and Caregiver Education Topics Provided Basic Hygiene: Handouts: Other: ostomy education provided by Denny Peon NP Methods: Demonstration, Explain/Verbal Wound/Skin Impairment: Handouts: Other: wound care orders to facility Methods: Printed Electronic Signature(s) Signed: 12/31/2017 5:43:37 PM By: Montey Hora Entered By: Montey Hora on 12/31/2017 17:20:06 [name] ([id number]) -------------------------------------------------------------------------------- Wound Assessment Details Patient Name: [name]. Date of Service: 12/31/2017 3:00 PM Medical Record Number: [id number] Patient Account Number: [id number] Date of Birth/Sex: 1945/12/17 (72 y.o. Female) Treating RN: Montey Hora Primary Care Tivis Wherry: Leanna Battles Other Clinician: Referring Donyea Gafford: Janie Morning Treating Uziel Covault/Extender: Cathie Olden in Treatment: 0 Wound Status Wound Number: 1 Primary Pressure Ulcer Etiology: Wound Location: Coccyx Wound Status: Open Wounding Event: Pressure  Injury Comorbid Cataracts, Hypertension, Type II Diabetes, Date Acquired: 10/20/2017 History: Osteoarthritis Weeks Of Treatment: 0 Clustered Wound: No Photos Wound Measurements Length: (cm) 3.6 % Reduction Width: (cm) 2.4 % Reduction Depth: (cm) 0.1 Epithelializ Area: (cm)  6.786 Tunneling: Volume: (cm) 0.679 Undermining in Area: 0% in Volume: 0% ation: Small (1-33%) No : No Wound Description Classification: Category/Stage III Foul Odor Af Wound Margin: Flat and Intact Slough/Fibri Exudate Amount: Medium Exudate Type: Serous Exudate Color: amber ter Cleansing: No no Yes Wound Bed Granulation Amount: Medium (34-66%) Exposed Structure Granulation Quality: Pink Fascia Exposed: No Necrotic Amount: Medium (34-66%) Fat Layer (Subcutaneous Tissue) Exposed: No Necrotic Quality: Adherent Slough Tendon Exposed: No Muscle Exposed: No Joint Exposed: No Bone Exposed: No Periwound Skin Texture Texture Color Packham, Halen H. ([id number]) No Abnormalities Noted: No No Abnormalities Noted: No Callus: No Atrophie Blanche: No Crepitus: No Cyanosis: No Excoriation: No Ecchymosis: No Induration: No Erythema: No Rash: No Hemosiderin Staining: No Scarring: No Mottled: No Pallor: No Moisture Rubor: No No Abnormalities Noted: No Dry / Scaly: No Temperature / Pain Maceration: No Temperature: No Abnormality Wound Preparation Ulcer Cleansing: Rinsed/Irrigated with Saline Topical Anesthetic Applied: None Treatment Notes Wound #1 (Coccyx) 1. Cleansed with: Clean wound with Normal Saline 2. Anesthetic Topical Lidocaine 4% cream to wound bed prior to debridement 4. Dressing Applied: Other dressing (specify in notes) 5. Secondary Dressing Applied Bordered Foam Dressing Notes silvercel Electronic Signature(s) Signed: 12/31/2017 5:21:04 PM By: Lawanda Cousins Signed: 12/31/2017 5:43:37 PM By: Montey Hora Entered By: Lawanda Cousins on 12/31/2017 17:21:04 [name] ([id number]) -------------------------------------------------------------------------------- Vitals Details Patient Name: [name] Date of Service: 12/31/2017 3:00 PM Medical Record Number: [id number] Patient Account Number: [id number] Date of Birth/Sex: 1946/06/14 (72 y.o.  Female) Treating RN: Montey Hora Primary Care Mahamed Zalewski: Leanna Battles Other Clinician: Referring Sabastian Raimondi: Janie Morning Treating Celia Gibbons/Extender: Cathie Olden in Treatment: 0 Vital Signs Time Taken: 15:47 Temperature (F): 98.2 Height (in): 63 Pulse (bpm): 78 Source: Measured Respiratory Rate (breaths/min): 18 Weight (lbs): 120 Blood Pressure (mmHg): 150/67 Source: Measured Reference Range: 80 - 120 mg / dl Body Mass Index (BMI): 21.3 Electronic Signature(s) Signed: 12/31/2017 5:43:37 PM By: Montey Hora Entered By: Montey Hora on 12/31/2017 15:49:13

## 2018-01-02 NOTE — Progress Notes (Signed)
RAJANEE, SCHUELKE ([id number]) Visit Report for 12/31/2017 Abuse/Suicide Risk Screen Details Patient Name: [name], [name]. Date of Service: 12/31/2017 3:00 PM Medical Record Number: [id number] Patient Account Number: [id number] Date of Birth/Sex: 12-May-1946 (72 y.o. Female) Treating RN: Montey Hora Primary Care Naveen Clardy: Leanna Battles Other Clinician: Referring Tejah Brekke: Janie Morning Treating Gretchen Weinfeld/Extender: Cathie Olden in Treatment: 0 Abuse/Suicide Risk Screen Items Answer ABUSE/SUICIDE RISK SCREEN: Has anyone close to you tried to hurt or harm you recentlyo No Do you feel uncomfortable with anyone in your familyo No Has anyone forced you do things that you didnot want to doo No Do you have any thoughts of harming yourselfo No Patient displays signs or symptoms of abuse and/or neglect. No Electronic Signature(s) Signed: 12/31/2017 5:43:37 PM By: Montey Hora Entered By: Montey Hora on 12/31/2017 15:50:29 [name] ([id number]) -------------------------------------------------------------------------------- Activities of Daily Living Details Patient Name: [name], [name]. Date of Service: 12/31/2017 3:00 PM Medical Record Number: [id number] Patient Account Number: [id number] Date of Birth/Sex: [birth year] (72 y.o. Female) Treating RN: Montey Hora Primary Care Josejulian Tarango: Leanna Battles Other Clinician: Referring Reynol Arnone: Janie Morning Treating Kapil Petropoulos/Extender: Cathie Olden in Treatment: 0 Activities of Daily Living Items Answer Activities of Daily Living (Please select one for each item) Drive Automobile Not Able Take Medications Need Assistance Use Telephone Need Assistance Care for Appearance Need Assistance Use Toilet Need Assistance Bath / Shower Need Assistance Dress Self Need Assistance Feed Self Completely Able Walk Need Assistance Get In / Out Bed Need Assistance Housework Need Assistance Prepare Meals Need Assistance Handle  Money Need Assistance Shop for Self Need Assistance Electronic Signature(s) Signed: 12/31/2017 5:43:37 PM By: Montey Hora Entered By: Montey Hora on 12/31/2017 15:51:01 [name] ([id number]) -------------------------------------------------------------------------------- Education Assessment Details Patient Name: [name] Date of Service: 12/31/2017 3:00 PM Medical Record Number: [id number] Patient Account Number: [id number] Date of Birth/Sex: 1946-05-25 (72 y.o. Female) Treating RN: Montey Hora Primary Care Amilliana Hayworth: Leanna Battles Other Clinician: Referring Nathaniel Yaden: Janie Morning Treating Laurent Cargile/Extender: Cathie Olden in Treatment: 0 Primary Learner Assessed: Patient Learning Preferences/Education Level/Primary Language Learning Preference: Explanation, Demonstration Highest Education Level: High School Preferred Language: English Cognitive Barrier Assessment/Beliefs Language Barrier: No Translator Needed: No Memory Deficit: No Emotional Barrier: No Cultural/Religious Beliefs Affecting Medical Care: No Physical Barrier Assessment Impaired Vision: No Impaired Hearing: No Decreased Hand dexterity: No Knowledge/Comprehension Assessment Knowledge Level: Medium Comprehension Level: Medium Ability to understand written Medium instructions: Ability to understand verbal Medium instructions: Motivation Assessment Anxiety Level: Calm Cooperation: Cooperative Education Importance: Acknowledges Need Interest in Health Problems: Asks Questions Perception: Coherent Willingness to Engage in Self- Medium Management Activities: Readiness to Engage in Self- Medium Management Activities: Electronic Signature(s) Signed: 12/31/2017 5:43:37 PM By: Montey Hora Entered By: Montey Hora on 12/31/2017 15:51:27 [name] ([id number]) -------------------------------------------------------------------------------- Fall Risk Assessment  Details Patient Name: [name]. Date of Service: 12/31/2017 3:00 PM Medical Record Number: [id number] Patient Account Number: [id number] Date of Birth/Sex: Nov 01, 1945 (72 y.o. Female) Treating RN: Montey Hora Primary Care Mac Dowdell: Leanna Battles Other Clinician: Referring Fannye Myer: Janie Morning Treating Shyloh Krinke/Extender: Cathie Olden in Treatment: 0 Fall Risk Assessment Items Have you had 2 or more falls in the last 12 monthso 0 No Have you had any fall that resulted in injury in the last 12 monthso 0 No FALL RISK ASSESSMENT: History of falling - immediate or within 3 months 25 Yes Secondary diagnosis 0 No Ambulatory aid None/bed rest/wheelchair/nurse 0 No Crutches/cane/walker 15 Yes Furniture 0 No IV Access/Saline Lock  0 No Gait/Training Normal/bed rest/immobile 0 Yes Weak 10 Yes Impaired 0 No Mental Status Oriented to own ability 0 Yes Electronic Signature(s) Signed: 12/31/2017 5:43:37 PM By: Montey Hora Entered By: Montey Hora on 12/31/2017 15:51:46 [name] ([id number]) -------------------------------------------------------------------------------- Nutrition Risk Assessment Details Patient Name: [name]. Date of Service: 12/31/2017 3:00 PM Medical Record Number: [id number] Patient Account Number: [id number] Date of Birth/Sex: August 04, 1946 (72 y.o. Female) Treating RN: Montey Hora Primary Care Ayriana Wix: Leanna Battles Other Clinician: Referring Seena Face: Janie Morning Treating Hondo Nanda/Extender: Cathie Olden in Treatment: 0 Height (in): 63 Weight (lbs): 120 Body Mass Index (BMI): 21.3 Nutrition Risk Assessment Items NUTRITION RISK SCREEN: I have an illness or condition that made me change the kind and/or amount of 0 No food I eat I eat fewer than two meals per day 0 No I eat few fruits and vegetables, or milk products 0 No I have three or more drinks of beer, liquor or wine almost every day 0 No I have tooth or  mouth problems that make it hard for me to eat 0 No I don't always have enough money to buy the food I need 0 No I eat alone most of the time 0 No I take three or more different prescribed or over-the-counter drugs a day 1 Yes Without wanting to, I have lost or gained 10 pounds in the last six months 0 No I am not always physically able to shop, cook and/or feed myself 0 No Nutrition Protocols Good Risk Protocol 0 No interventions needed Moderate Risk Protocol Electronic Signature(s) Signed: 12/31/2017 5:43:37 PM By: Montey Hora Entered By: Montey Hora on 12/31/2017 15:51:52

## 2018-01-06 DIAGNOSIS — L905 Scar conditions and fibrosis of skin: Secondary | ICD-10-CM | POA: Insufficient documentation

## 2018-01-06 DIAGNOSIS — S0180XS Unspecified open wound of other part of head, sequela: Secondary | ICD-10-CM | POA: Diagnosis not present

## 2018-01-07 ENCOUNTER — Encounter: Payer: No Typology Code available for payment source | Admitting: Internal Medicine

## 2018-01-07 DIAGNOSIS — E11622 Type 2 diabetes mellitus with other skin ulcer: Secondary | ICD-10-CM | POA: Diagnosis not present

## 2018-01-07 DIAGNOSIS — L89153 Pressure ulcer of sacral region, stage 3: Secondary | ICD-10-CM | POA: Diagnosis not present

## 2018-01-07 DIAGNOSIS — Z932 Ileostomy status: Secondary | ICD-10-CM | POA: Diagnosis not present

## 2018-01-07 DIAGNOSIS — S3991XA Unspecified injury of abdomen, initial encounter: Secondary | ICD-10-CM | POA: Diagnosis not present

## 2018-01-07 DIAGNOSIS — Z9049 Acquired absence of other specified parts of digestive tract: Secondary | ICD-10-CM | POA: Diagnosis not present

## 2018-01-07 DIAGNOSIS — E1151 Type 2 diabetes mellitus with diabetic peripheral angiopathy without gangrene: Secondary | ICD-10-CM | POA: Diagnosis not present

## 2018-01-09 NOTE — Progress Notes (Signed)
CAROLLYNN, PENNYWELL ([id number]) Visit Report for 01/07/2018 Arrival Information Details Patient Name: [name], [name]. Date of Service: 01/07/2018 8:45 AM Medical Record Number: [id number] Patient Account Number: [id number] Date of Birth/Sex: 22-May-1946 (72 y.o. F) Treating RN: Ahmed Prima Primary Care Demar Shad: Leanna Battles Other Clinician: Referring Brittlyn Cloe: Leanna Battles Treating Breck Hollinger/Extender: Tito Dine in Treatment: 1 Visit Information History Since Last Visit All ordered tests and consults were completed: No Patient Arrived: Wheel Chair Added or deleted any medications: No Arrival Time: 08:48 Any new allergies or adverse reactions: No Accompanied By: self Had a fall or experienced change in No activities of daily living that may affect Transfer Assistance: None risk of falls: Patient Identification Verified: Yes Signs or symptoms of abuse/neglect since last visito No Secondary Verification Process Completed: Yes Hospitalized since last visit: No Patient Requires Transmission-Based No Implantable device outside of the clinic excluding No Precautions: cellular tissue based products placed in the center Patient Has Alerts: Yes since last visit: Patient Alerts: DMII Has Dressing in Place as Prescribed: Yes Pain Present Now: No Electronic Signature(s) Signed: 01/07/2018 4:37:47 PM By: Alric Quan Entered By: Alric Quan on 01/07/2018 08:51:15 [name] ([id number]) -------------------------------------------------------------------------------- Clinic Level of Care Assessment Details Patient Name: [name]. Date of Service: 01/07/2018 8:45 AM Medical Record Number: [id number] Patient Account Number: [id number] Date of Birth/Sex: 1945-12-05 (72 y.o. F) Treating RN: Cornell Barman Primary Care Brighton Pilley: Leanna Battles Other Clinician: Referring Sian Joles: Leanna Battles Treating Tayra Dawe/Extender: Tito Dine in  Treatment: 1 Clinic Level of Care Assessment Items TOOL 4 Quantity Score []  - Use when only an EandM is performed on FOLLOW-UP visit 0 ASSESSMENTS - Nursing Assessment / Reassessment []  - Reassessment of Co-morbidities (includes updates in patient status) 0 X- 1 5 Reassessment of Adherence to Treatment Plan ASSESSMENTS - Wound and Skin Assessment / Reassessment X - Simple Wound Assessment / Reassessment - one wound 1 5 []  - 0 Complex Wound Assessment / Reassessment - multiple wounds []  - 0 Dermatologic / Skin Assessment (not related to wound area) ASSESSMENTS - Focused Assessment []  - Circumferential Edema Measurements - multi extremities 0 []  - 0 Nutritional Assessment / Counseling / Intervention []  - 0 Lower Extremity Assessment (monofilament, tuning fork, pulses) []  - 0 Peripheral Arterial Disease Assessment (using hand held doppler) ASSESSMENTS - Ostomy and/or Continence Assessment and Care []  - Incontinence Assessment and Management 0 []  - 0 Ostomy Care Assessment and Management (repouching, etc.) PROCESS - Coordination of Care X - Simple Patient / Family Education for ongoing care 1 15 []  - 0 Complex (extensive) Patient / Family Education for ongoing care []  - 0 Staff obtains Programmer, systems, Records, Test Results / Process Orders []  - 0 Staff telephones HHA, Nursing Homes / Clarify orders / etc []  - 0 Routine Transfer to another Facility (non-emergent condition) []  - 0 Routine Hospital Admission (non-emergent condition) []  - 0 New Admissions / Biomedical engineer / Ordering NPWT, Apligraf, etc. []  - 0 Emergency Hospital Admission (emergent condition) X- 1 10 Simple Discharge Coordination GLENNYS, SCHORSCH. ([id number]) []  - 0 Complex (extensive) Discharge Coordination PROCESS - Special Needs []  - Pediatric / Minor Patient Management 0 []  - 0 Isolation Patient Management []  - 0 Hearing / Language / Visual special needs []  - 0 Assessment of Community assistance  (transportation, D/C planning, etc.) []  - 0 Additional assistance / Altered mentation []  - 0 Support Surface(s) Assessment (bed, cushion, seat, etc.) INTERVENTIONS - Wound Cleansing / Measurement X - Simple Wound  Cleansing - one wound 1 5 []  - 0 Complex Wound Cleansing - multiple wounds X- 1 5 Wound Imaging (photographs - any number of wounds) []  - 0 Wound Tracing (instead of photographs) X- 1 5 Simple Wound Measurement - one wound []  - 0 Complex Wound Measurement - multiple wounds INTERVENTIONS - Wound Dressings []  - Small Wound Dressing one or multiple wounds 0 X- 1 15 Medium Wound Dressing one or multiple wounds []  - 0 Large Wound Dressing one or multiple wounds []  - 0 Application of Medications - topical []  - 0 Application of Medications - injection INTERVENTIONS - Miscellaneous []  - External ear exam 0 []  - 0 Specimen Collection (cultures, biopsies, blood, body fluids, etc.) []  - 0 Specimen(s) / Culture(s) sent or taken to Lab for analysis []  - 0 Patient Transfer (multiple staff / Civil Service fast streamer / Similar devices) []  - 0 Simple Staple / Suture removal (25 or less) []  - 0 Complex Staple / Suture removal (26 or more) []  - 0 Hypo / Hyperglycemic Management (close monitor of Blood Glucose) []  - 0 Ankle / Brachial Index (ABI) - do not check if billed separately X- 1 5 Vital Signs Knutzen, Zurie H. ([id number]) Has the patient been seen at the hospital within the last three years: Yes Total Score: 70 Level Of Care: New/Established - Level 2 Electronic Signature(s) Signed: 01/07/2018 5:06:18 PM By: Gretta Cool, BSN, RN, CWS, Kim RN, BSN Entered By: Gretta Cool, BSN, RN, CWS, Kim on 01/07/2018 09:04:30 [name] ([id number]) -------------------------------------------------------------------------------- Encounter Discharge Information Details Patient Name: [name]. Date of Service: 01/07/2018 8:45 AM Medical Record Number: [id number] Patient Account Number:  [id number] Date of Birth/Sex: [birth year] (72 y.o. F) Treating RN: Montey Hora Primary Care Josehua Hammar: Leanna Battles Other Clinician: Referring Leeanna Slaby: Leanna Battles Treating Marji Kuehnel/Extender: Tito Dine in Treatment: 1 Encounter Discharge Information Items Discharge Pain Level: 0 Discharge Condition: Stable Ambulatory Status: Wheelchair Discharge Destination: Nursing Home Transportation: Private Auto Accompanied By: self Schedule Follow-up Appointment: Yes Medication Reconciliation completed and No provided to Patient/Care Casin Federici: Provided on Clinical Summary of Care: 01/07/2018 Form Type Recipient Paper Patient LR Electronic Signature(s) Signed: 01/08/2018 9:24:56 AM By: Ruthine Dose Entered By: Ruthine Dose on 01/07/2018 09:09:03 [name] ([id number]) -------------------------------------------------------------------------------- Lower Extremity Assessment Details Patient Name: [name]. Date of Service: 01/07/2018 8:45 AM Medical Record Number: [id number] Patient Account Number: [id number] Date of Birth/Sex: 1946-10-10 (72 y.o. F) Treating RN: Ahmed Prima Primary Care Tayte Mcwherter: Leanna Battles Other Clinician: Referring Jazmine Heckman: Leanna Battles Treating Tel Hevia/Extender: Ricard Dillon Weeks in Treatment: 1 Electronic Signature(s) Signed: 01/07/2018 4:37:47 PM By: Alric Quan Entered By: Alric Quan on 01/07/2018 08:56:42 [name] ([id number]) -------------------------------------------------------------------------------- Multi Wound Chart Details Patient Name: [name]. Date of Service: 01/07/2018 8:45 AM Medical Record Number: [id number] Patient Account Number: [id number] Date of Birth/Sex: September 23, 1946 (72 y.o. F) Treating RN: Cornell Barman Primary Care Ethan Kasperski: Leanna Battles Other Clinician: Referring Kemyra August: Leanna Battles Treating Inella Kuwahara/Extender: Tito Dine in Treatment:  1 Vital Signs Height(in): 63 Pulse(bpm): 80 Weight(lbs): 120 Blood Pressure(mmHg): 105/72 Body Mass Index(BMI): 21 Temperature(F): 97.6 Respiratory Rate 18 (breaths/min): Photos: [1:No Photos] [N/A:N/A] Wound Location: [1:Coccyx] [N/A:N/A] Wounding Event: [1:Pressure Injury] [N/A:N/A] Primary Etiology: [1:Pressure Ulcer] [N/A:N/A] Comorbid History: [1:Cataracts, Hypertension, Type N/A II Diabetes, Osteoarthritis] Date Acquired: [1:10/20/2017] [N/A:N/A] Weeks of Treatment: [1:1] [N/A:N/A] Wound Status: [1:Open] [N/A:N/A] Measurements L x W x D [1:2x2x0.1] [N/A:N/A] (cm) Area (cm) : [1:3.142] [N/A:N/A] Volume (cm) : [1:0.314] [N/A:N/A] % Reduction in Area: [1:53.70%] [  N/A:N/A] % Reduction in Volume: [1:53.80%] [N/A:N/A] Classification: [1:Category/Stage III] [N/A:N/A] Exudate Amount: [1:Large] [N/A:N/A] Exudate Type: [1:Purulent] [N/A:N/A] Exudate Color: [1:yellow, brown, green] [N/A:N/A] Wound Margin: [1:Flat and Intact] [N/A:N/A] Granulation Amount: [1:Large (67-100%)] [N/A:N/A] Granulation Quality: [1:Pink] [N/A:N/A] Necrotic Amount: [1:Small (1-33%)] [N/A:N/A] Exposed Structures: [1:Fascia: No Fat Layer (Subcutaneous Tissue) Exposed: No Tendon: No Muscle: No Joint: No Bone: No] [N/A:N/A] Epithelialization: [1:Small (1-33%)] [N/A:N/A] Periwound Skin Texture: [1:Excoriation: No Induration: No Callus: No Crepitus: No Rash: No Scarring: No] [N/A:N/A] Periwound Skin Moisture: Maceration: No N/A N/A Dry/Scaly: No Periwound Skin Color: Atrophie Blanche: No N/A N/A Cyanosis: No Ecchymosis: No Erythema: No Hemosiderin Staining: No Mottled: No Pallor: No Rubor: No Temperature: No Abnormality N/A N/A Tenderness on Palpation: No N/A N/A Wound Preparation: Ulcer Cleansing: N/A N/A Rinsed/Irrigated with Saline Topical Anesthetic Applied: Other: lidocaine 4% Treatment Notes Wound #1 (Coccyx) 1. Cleansed with: Clean wound with Normal Saline 2. Anesthetic Topical  Lidocaine 4% cream to wound bed prior to debridement 3. Peri-wound Care: Skin Prep 4. Dressing Applied: Other dressing (specify in notes) 5. Secondary Dressing Applied Bordered Foam Dressing Notes silvercel Electronic Signature(s) Signed: 01/08/2018 8:15:41 AM By: Linton Ham MD Entered By: Linton Ham on 01/07/2018 09:10:52 [name] ([id number]) -------------------------------------------------------------------------------- [place] Details Patient Name: [name] Date of Service: 01/07/2018 8:45 AM Medical Record Number: [id number] Patient Account Number: [id number] Date of Birth/Sex: 03-08-1946 (72 y.o. F) Treating RN: Cornell Barman Primary Care Ramonica Grigg: Leanna Battles Other Clinician: Referring Eveleigh Crumpler: Leanna Battles Treating Seymone Forlenza/Extender: Tito Dine in Treatment: 1 Active Inactive ` Abuse / Safety / Falls / Self Care Management Nursing Diagnoses: Impaired physical mobility Goals: Patient will not experience any injury related to falls Date Initiated: 12/31/2017 Target Resolution Date: 03/21/2018 Goal Status: Active Interventions: Assess fall risk on admission and as needed Notes: ` Orientation to the Wound Care Program Nursing Diagnoses: Knowledge deficit related to the wound healing center program Goals: Patient/caregiver will verbalize understanding of the [facility] Date Initiated: 12/31/2017 Target Resolution Date: 03/21/2018 Goal Status: Active Interventions: Provide education on orientation to the wound center Notes: ` Wound/Skin Impairment Nursing Diagnoses: Impaired tissue integrity Goals: Ulcer/skin breakdown will heal within 14 weeks Date Initiated: 12/31/2017 Target Resolution Date: 01/17/2018 Goal Status: Active Interventions: KIMIAH, HIBNER ([id number]) Assess patient/caregiver ability to obtain necessary supplies Assess patient/caregiver ability to perform ulcer/skin  care regimen upon admission and as needed Assess ulceration(s) every visit Notes: Electronic Signature(s) Signed: 01/07/2018 5:06:18 PM By: Gretta Cool, BSN, RN, CWS, Kim RN, BSN Entered By: Gretta Cool, BSN, RN, CWS, Kim on 01/07/2018 09:00:45 [name] ([id number]) -------------------------------------------------------------------------------- Pain Assessment Details Patient Name: [name]. Date of Service: 01/07/2018 8:45 AM Medical Record Number: [id number] Patient Account Number: [id number] Date of Birth/Sex: 06-14-1946 (72 y.o. F) Treating RN: Ahmed Prima Primary Care Qais Jowers: Leanna Battles Other Clinician: Referring Ellin Fitzgibbons: Leanna Battles Treating Winfred Iiams/Extender: Ricard Dillon Weeks in Treatment: 1 Active Problems Location of Pain Severity and Description of Pain Patient Has Paino No Site Locations Pain Management and Medication Current Pain Management: Electronic Signature(s) Signed: 01/07/2018 4:37:47 PM By: Alric Quan Entered By: Alric Quan on 01/07/2018 08:51:21 [name] ([id number]) -------------------------------------------------------------------------------- Patient/Caregiver Education Details Patient Name: [name]. Date of Service: 01/07/2018 8:45 AM Medical Record Number: [id number] Patient Account Number: [id number] Date of Birth/Gender: [birth year] (72 y.o. F) Treating RN: Montey Hora Primary Care Physician: Leanna Battles Other Clinician: Referring Physician: Leanna Battles Treating Physician/Extender: Tito Dine in Treatment: 1 Education Assessment  Education Provided To: Caregiver SNF nurses via written orders Education Topics Provided Wound/Skin Impairment: Handouts: Other: wound care orders Methods: Pharmacist, hospital) Signed: 01/07/2018 4:02:56 PM By: Montey Hora Entered By: Montey Hora on 01/07/2018 09:07:39 [name]  ([id number]) -------------------------------------------------------------------------------- Wound Assessment Details Patient Name: [name]. Date of Service: 01/07/2018 8:45 AM Medical Record Number: [id number] Patient Account Number: [id number] Date of Birth/Sex: [birth year] (72 y.o. F) Treating RN: Ahmed Prima Primary Care Oluwasemilore Pascuzzi: Leanna Battles Other Clinician: Referring Deserae Jennings: Leanna Battles Treating Deni Lefever/Extender: Ricard Dillon Weeks in Treatment: 1 Wound Status Wound Number: 1 Primary Pressure Ulcer Etiology: Wound Location: Coccyx Wound Status: Open Wounding Event: Pressure Injury Comorbid Cataracts, Hypertension, Type II Diabetes, Date Acquired: 10/20/2017 History: Osteoarthritis Weeks Of Treatment: 1 Clustered Wound: No Photos Photo Uploaded By: Roger Shelter on 01/07/2018 16:24:17 Wound Measurements Length: (cm) 2 Width: (cm) 2 Depth: (cm) 0.1 Area: (cm) 3.142 Volume: (cm) 0.314 % Reduction in Area: 53.7% % Reduction in Volume: 53.8% Epithelialization: Small (1-33%) Tunneling: No Undermining: No Wound Description Classification: Category/Stage III Wound Margin: Flat and Intact Exudate Amount: Large Exudate Type: Purulent Exudate Color: yellow, brown, green Foul Odor After Cleansing: No Slough/Fibrino Yes Wound Bed Granulation Amount: Large (67-100%) Exposed Structure Granulation Quality: Pink Fascia Exposed: No Necrotic Amount: Small (1-33%) Fat Layer (Subcutaneous Tissue) Exposed: No Necrotic Quality: Adherent Slough Tendon Exposed: No Muscle Exposed: No Joint Exposed: No Bone Exposed: No Periwound Skin Texture Texture Color Pichon, Merridy H. ([id number]) No Abnormalities Noted: No No Abnormalities Noted: No Callus: No Atrophie Blanche: No Crepitus: No Cyanosis: No Excoriation: No Ecchymosis: No Induration: No Erythema: No Rash: No Hemosiderin Staining: No Scarring: No Mottled: No Pallor:  No Moisture Rubor: No No Abnormalities Noted: No Dry / Scaly: No Temperature / Pain Maceration: No Temperature: No Abnormality Wound Preparation Ulcer Cleansing: Rinsed/Irrigated with Saline Topical Anesthetic Applied: Other: lidocaine 4%, Treatment Notes Wound #1 (Coccyx) 1. Cleansed with: Clean wound with Normal Saline 2. Anesthetic Topical Lidocaine 4% cream to wound bed prior to debridement 3. Peri-wound Care: Skin Prep 4. Dressing Applied: Other dressing (specify in notes) 5. Secondary Dressing Applied Bordered Foam Dressing Notes silvercel Electronic Signature(s) Signed: 01/07/2018 4:37:47 PM By: Alric Quan Entered By: Alric Quan on 01/07/2018 08:56:34 [name] ([id number]) -------------------------------------------------------------------------------- Vitals Details Patient Name: [name] Date of Service: 01/07/2018 8:45 AM Medical Record Number: [id number] Patient Account Number: [id number] Date of Birth/Sex: 1946-04-23 (72 y.o. F) Treating RN: Ahmed Prima Primary Care Elantra Caprara: Leanna Battles Other Clinician: Referring Willo Yoon: Leanna Battles Treating Morrisa Aldaba/Extender: Tito Dine in Treatment: 1 Vital Signs Time Taken: 08:51 Temperature (F): 97.6 Height (in): 63 Pulse (bpm): 80 Weight (lbs): 120 Respiratory Rate (breaths/min): 18 Body Mass Index (BMI): 21.3 Blood Pressure (mmHg): 105/72 Reference Range: 80 - 120 mg / dl Electronic Signature(s) Signed: 01/07/2018 4:37:47 PM By: Alric Quan Entered By: Alric Quan on 01/07/2018 08:52:25

## 2018-01-09 NOTE — Progress Notes (Signed)
EZME, DUCH ([id number]) Visit Report for 01/07/2018 HPI Details Patient Name: [name], [name]. Date of Service: 01/07/2018 8:45 AM Medical Record Number: [id number] Patient Account Number: [id number] Date of Birth/Sex: 03/02/1946 (72 y.o. 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 She had a complicated postoperative course with respiratory failure, acute bilateral watershed cerebral infarcts, reintubation, tracheostomy, postoperative healing by secondary intention with NPWT, blood transfusion; she was treated with broad-spectrum antibiotic (meropenem, vancomycin, Zosyn, Unasyn) and antifungal (anidulafungin). She was also noted to have a sacral pressure ulcer during this admission, treated with Santyl. She was discharged to inpatient rehabilitation, then to SNF/rehab ([facility]) with plans to discharge home this weekend. She's been experiencing multiple complications with ileostomy since arriving to [facility], staff at facility unable to maintain appliance with noted peristomal skin breakdown. We discussed at length ostomy care and skin related challenges with ileostomy effluent. Her daughter-in-law accompanies her today, with multiple questions answered. She presents with a stage 3 sacral/coccyx pressure ulcer. She is ambulatory and has a foam cushion in her wheelchair. The facility has been placing collagen per the daughter-in-law. 01/07/18; this is a patient who came in predominantly related to  issues with a new ileostomy. Apparently changes were made and the ostomy is doing better. She is of course going to have a large amount of ostomy output from an ileostomy and she seems aware of that. During the course of last week's visit she was incidentally discovered to have a pressure ulcer on the sacrum. Using silver alginate this is smaller Electronic Signature(s) Signed: 01/08/2018 8:15:41 AM By: Linton Ham MD Entered By: Linton Ham on 01/07/2018 09:12:29 [name] ([id number]) -------------------------------------------------------------------------------- Physical Exam Details Patient Name: [name]. Date of Service: 01/07/2018 8:45 AM Medical Record Number: [id number] Patient Account Number: [id number] Date of Birth/Sex: 07/28/1946 (72 y.o. F) Treating RN: Cornell Barman Primary Care Provider: Leanna Battles Other Clinician: Referring Provider: Leanna Battles Treating Provider/Extender: Ricard Dillon Weeks in Treatment: 1 Constitutional Sitting or standing Blood Pressure is within target range for patient.. Pulse regular and within target range for patient.Marland Kitchen Respirations regular, non-labored and within target range.. Temperature is normal and within the target range for the patient.Marland Kitchen appears in no distress. Eyes Conjunctivae clear. No discharge. Ears, Nose, Mouth, and Throat mucous membranes are moist. Cardiovascular patient does not look to be dehydrated. Gastrointestinal (GI) ileostomy in the right mid quadrant as well sealed. Integumentary (Hair, Skin) no primary skin issues are seen. Psychiatric No evidence of depression, anxiety, or agitation. Calm, cooperative, and communicative. Appropriate interactions and affect.. Notes wound exam; area on the lower sacral area. Fairly large stage 3 pressure ulcer. This appears to be getting smaller. Surface has some adherent material but I didn't think this required debridement. If the measurements  stalled then mechanical debridement or another primary dressing might be in order. There is no evidence of surrounding infection Electronic Signature(s) Signed: 01/08/2018 8:15:41 AM By: Linton Ham MD Entered By: Linton Ham on 01/07/2018 09:15:23 [name] ([id number]) -------------------------------------------------------------------------------- Physician Orders Details Patient Name: [name] Date of Service: 01/07/2018 8:45 AM Medical Record Number: [id number] Patient Account  Number: [id number] Date of Birth/Sex: 1945/11/11 (72 y.o. F) Treating RN: Cornell Barman Primary Care Provider: Leanna Battles Other Clinician: Referring Provider: Leanna Battles Treating Provider/Extender: Tito Dine in Treatment: 1 Verbal / Phone Orders: No Diagnosis Coding Wound Cleansing Wound #1 Coccyx o Clean wound with Normal Saline. o May Shower, gently pat wound dry prior to applying new dressing. Anesthetic (add to Medication List) Wound #1 Coccyx o Topical Lidocaine 4% cream applied to wound bed prior to debridement (In Clinic Only). Primary Wound Dressing Wound #1 Coccyx o Silver Alginate Secondary Dressing Wound #1 Coccyx o Boardered Foam Dressing Dressing Change Frequency Wound #1 Coccyx o Change dressing every day. Follow-up Appointments Wound #1 Coccyx o Return Appointment in 1 week. Patient Medications Allergies: codeine HCl, Iodinated Contrast- Oral and IV Dye Notifications Medication Indication Start End lidocaine DOSE topical 4 % cream - cream topical Electronic Signature(s) Signed: 01/07/2018 5:06:18 PM By: Gretta Cool, BSN, RN, CWS, Kim RN, BSN Signed: 01/08/2018 8:15:41 AM By: Linton Ham MD Entered By: Gretta Cool, BSN, RN, CWS, Kim on 01/07/2018 09:03:34 [name], [name] ([id number]) -------------------------------------------------------------------------------- Prescription 01/07/2018 Patient Name: [name]. Provider:  Ricard Dillon MD Date of Birth: [birth year] NPI#: [id number] Sex: F DEA#: [id number] Phone #: [phone] License #: [id number] Patient Address: [place], [place] [address] [address], [place], [place] [address] [phone] Allergies codeine HCl Iodinated Contrast- Oral and IV Dye Medication Medication: Route: Strength: Form: lidocaine topical 4% cream Class: TOPICAL LOCAL ANESTHETICS Dose: Frequency / Time: Indication: cream topical Number of Refills: Number of Units: 0 Generic Substitution: Start Date: End Date: Administered at [place]: No Note to Pharmacy: Signature(s): Date(s): Electronic Signature(s) Signed: 01/07/2018 5:06:18 PM By: Gretta Cool, BSN, RN, CWS, Kim RN, BSN Signed: 01/08/2018 8:15:41 AM By: Linton Ham MD Entered By: Gretta Cool, BSN, RN, CWS, Kim on 01/07/2018 09:03:34 ANSLIE, SPADAFORA ([id number] Strong ([id number]) --------------------------------------------------------------------------------  Problem List Details Patient Name: [name]. Date of Service: 01/07/2018 8:45 AM Medical Record Number: [id number] Patient Account Number: [id number] Date of Birth/Sex: 1946-09-06 (72 y.o. F) Treating RN: Cornell Barman Primary Care Provider: Leanna Battles Other Clinician: Referring Provider: Leanna Battles Treating Provider/Extender: Tito Dine in Treatment: 1 Active Problems ICD-10 Impacting Encounter Code Description Active Date Wound Healing Diagnosis Z93.2 Ileostomy status 12/31/2017 Yes R54 Age-related physical debility 12/31/2017 Yes S30.92XS Unspecified superficial injury of abdominal wall, sequela 12/31/2017 Yes L89.153 Pressure ulcer of sacral region, stage 3 12/31/2017 Yes Inactive Problems Resolved Problems Electronic Signature(s) Signed: 01/08/2018 8:15:41 AM By: Linton Ham  MD Entered By: Linton Ham on 01/07/2018 09:10:22 [name] ([id number]) -------------------------------------------------------------------------------- Progress Note Details Patient Name: [name]. Date of Service: 01/07/2018 8:45 AM Medical Record Number: [id number] Patient Account Number: [id number] Date of Birth/Sex: April 25, 1946 (72 y.o. 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 She had a complicated postoperative course with respiratory failure, acute bilateral watershed cerebral infarcts, reintubation, tracheostomy, postoperative healing by secondary intention with NPWT, blood transfusion; she was treated with broad-spectrum antibiotic (meropenem, vancomycin, Zosyn, Unasyn) and antifungal (anidulafungin).  She was also noted to have a sacral pressure ulcer during this admission, treated with Santyl. She was discharged to inpatient rehabilitation, then to SNF/rehab ([facility]) with plans to discharge home this weekend. She's been experiencing multiple complications with ileostomy since arriving to [facility], staff at facility unable to maintain appliance with noted peristomal skin breakdown. We discussed at length ostomy care and skin related challenges with ileostomy effluent. Her daughter-in-law accompanies her today, with multiple questions answered. She presents with a stage 3 sacral/coccyx pressure ulcer. She is ambulatory and has a foam cushion in her wheelchair. The facility  has been placing collagen per the daughter-in-law. 01/07/18; this is a patient who came in predominantly related to issues with a new ileostomy. Apparently changes were made and the ostomy is doing better. She is of course going to have a large amount of ostomy output from an ileostomy and she seems aware of that. During the course of last week's visit she was incidentally discovered to have a pressure ulcer on the sacrum. Using silver alginate this is smaller Objective Constitutional Sitting or standing Blood Pressure is within target range for patient.. Pulse regular and within target range for patient.Marland Kitchen Respirations regular, non-labored and within target range.. Temperature is normal and within the target range for the patient.Marland Kitchen appears in no distress. Vitals Time Taken: 8:51 AM, Height: 63 in, Weight: 120 lbs, BMI: 21.3, Temperature: 97.6 F, Pulse: 80 bpm, Respiratory Rate: 18 breaths/min, Blood Pressure: 105/72 mmHg. Eyes Conjunctivae clear. No discharge. Ears, Nose, Mouth, and Throat mucous membranes are moist. Cardiovascular patient does not look to be dehydrated. [name], [name] ([id number]) Gastrointestinal (GI) ileostomy in the right mid quadrant as well sealed. Psychiatric No evidence of depression, anxiety, or agitation. Calm, cooperative, and communicative. Appropriate interactions and affect.. General Notes: wound exam; area on the lower sacral area. Fairly large stage 3 pressure ulcer. This appears to be getting smaller. Surface has some adherent material but I didn't think this required debridement. If the measurements stalled then mechanical debridement or another primary dressing might be in order. There is no evidence of surrounding infection Integumentary (Hair, Skin) no primary skin issues are seen. Wound #1 status is Open. Original cause of wound was Pressure Injury. The wound is located on the Coccyx. The wound measures 2cm length x 2cm width x 0.1cm depth;  3.142cm^2 area and 0.314cm^3 volume. There is no tunneling or undermining noted. There is a large amount of purulent drainage noted. The wound margin is flat and intact. There is large (67-100%) pink granulation within the wound bed. There is a small (1-33%) amount of necrotic tissue within the wound bed including Adherent Slough. The periwound skin appearance did not exhibit: Callus, Crepitus, Excoriation, Induration, Rash, Scarring, Dry/Scaly, Maceration, Atrophie Blanche, Cyanosis, Ecchymosis, Hemosiderin Staining, Mottled, Pallor, Rubor, Erythema. Periwound temperature was noted as No Abnormality. Assessment Active Problems ICD-10 Z93.2 - Ileostomy status R54 - Age-related physical debility S30.92XS - Unspecified superficial injury of abdominal wall, sequela L89.153 - Pressure ulcer of sacral region, stage 3 Plan Wound Cleansing: Wound #1 Coccyx: Clean wound with Normal Saline. May Shower, gently pat wound dry prior to applying new dressing. Anesthetic (add to Medication List): Wound #1 Coccyx: Topical Lidocaine 4% cream applied to wound bed prior to debridement (In Clinic Only). Primary Wound Dressing: Wound #1 Coccyx: Silver Alginate Secondary Dressing: Wound #1 Coccyx: Boardered Foam Dressing Dressing Change Frequency: Wound #1 Coccyx: Change dressing every day. [name], [name] ([id number]) Follow-up Appointments: Wound #1 Coccyx: Return Appointment  in 1 week. The following medication(s) was prescribed: lidocaine topical 4 % cream cream topical #1 I think we should continue with the silver alginate to the lower sacrum/coccyx wound. This appears to be making decent progress with improvement in dimensions. There is no evidence of surrounding infection. If this stalls and she may need debridement and/or Iodoflex Electronic Signature(s) Signed: 01/08/2018 8:15:41 AM By: Linton Ham MD Entered By: Linton Ham on 01/07/2018 09:16:25 [name]  ([id number]) -------------------------------------------------------------------------------- SuperBill Details Patient Name: [name]. Date of Service: 01/07/2018 Medical Record Number: [id number] Patient Account Number: [id number] Date of Birth/Sex: 07/01/1946 (72 y.o. F) Treating RN: Cornell Barman Primary Care Provider: Leanna Battles Other Clinician: Referring Provider: Leanna Battles Treating Provider/Extender: Tito Dine in Treatment: 1 Diagnosis Coding ICD-10 Codes Code Description Z93.2 Ileostomy status R54 Age-related physical debility S30.92XS Unspecified superficial injury of abdominal wall, sequela L89.153 Pressure ulcer of sacral region, stage 3 Facility Procedures CPT4 Code: [id number] Description: [phone] - WOUND CARE VISIT-LEV 2 EST PT Modifier: Quantity: 1 Physician Procedures CPT4 Code: [id number] Description: [id number] - WC PHYS LEVEL 3 - EST PT ICD-10 Diagnosis Description L89.153 Pressure ulcer of sacral region, stage 3 Modifier: Quantity: 1 Electronic Signature(s) Signed: 01/08/2018 8:15:41 AM By: Linton Ham MD Entered By: Linton Ham on 01/07/2018 09:16:49

## 2018-01-14 ENCOUNTER — Encounter: Payer: Medicare Other | Attending: Internal Medicine | Admitting: Internal Medicine

## 2018-01-14 DIAGNOSIS — L89323 Pressure ulcer of left buttock, stage 3: Secondary | ICD-10-CM | POA: Insufficient documentation

## 2018-01-14 DIAGNOSIS — L89153 Pressure ulcer of sacral region, stage 3: Secondary | ICD-10-CM | POA: Diagnosis not present

## 2018-01-14 DIAGNOSIS — Z932 Ileostomy status: Secondary | ICD-10-CM | POA: Diagnosis not present

## 2018-01-19 NOTE — Progress Notes (Signed)
EMAYA, PRESTON ([id number]) Visit Report for 01/14/2018 Arrival Information Details Patient Name: [name]. Date of Service: 01/14/2018 9:30 AM Medical Record Number: [id number] Patient Account Number: [id number] Date of Birth/Sex: [birth year] (72 y.o. F) Treating RN: Ahmed Prima Primary Care Darnice Comrie: Leanna Battles Other Clinician: Referring Genoa Freyre: Leanna Battles Treating Lovenia Debruler/Extender: Tito Dine in Treatment: 2 Visit Information History Since Last Visit All ordered tests and consults were completed: No Patient Arrived: Wheel Chair Added or deleted any medications: No Arrival Time: 09:27 Any new allergies or adverse reactions: No Accompanied By: self Had a fall or experienced change in No Transfer Assistance: EasyPivot Patient activities of daily living that may affect Lift risk of falls: Patient Identification Verified: Yes Signs or symptoms of abuse/neglect since last visito No Secondary Verification Process Yes Hospitalized since last visit: No Completed: Implantable device outside of the clinic excluding No Patient Requires Transmission-Based No cellular tissue based products placed in the center Precautions: since last visit: Patient Has Alerts: Yes Has Dressing in Place as Prescribed: Yes Patient Alerts: DMII Pain Present Now: No Electronic Signature(s) Signed: 01/15/2018 4:32:30 PM By: Alric Quan Entered By: Alric Quan on 01/14/2018 09:28:15 [name] ([id number]) -------------------------------------------------------------------------------- Clinic Level of Care Assessment Details Patient Name: [name]. Date of Service: 01/14/2018 9:30 AM Medical Record Number: [id number] Patient Account Number: [id number] Date of Birth/Sex: 1946-08-09 (72 y.o. F) Treating RN: Cornell Barman Primary Care Shamanda Len: Leanna Battles Other Clinician: Referring Jamiere Gulas: Leanna Battles Treating Jatavian Calica/Extender: Tito Dine in Treatment: 2 Clinic Level of Care Assessment Items TOOL 4 Quantity Score []  - Use when only an EandM is performed on FOLLOW-UP visit 0 ASSESSMENTS - Nursing Assessment / Reassessment []  - Reassessment of Co-morbidities (includes updates in patient status) 0 X- 1 5 Reassessment of Adherence to Treatment Plan ASSESSMENTS - Wound and Skin Assessment / Reassessment X - Simple Wound Assessment / Reassessment - one wound 1 5 []  - 0 Complex Wound Assessment / Reassessment - multiple wounds []  - 0 Dermatologic / Skin Assessment (not related to wound area) ASSESSMENTS - Focused Assessment []  - Circumferential Edema Measurements - multi extremities 0 []  - 0 Nutritional Assessment / Counseling / Intervention []  - 0 Lower Extremity Assessment (monofilament, tuning fork, pulses) []  - 0 Peripheral Arterial Disease Assessment (using hand held doppler) ASSESSMENTS - Ostomy and/or Continence Assessment and Care []  - Incontinence Assessment and Management 0 []  - 0 Ostomy Care Assessment and Management (repouching, etc.) PROCESS - Coordination of Care X - Simple Patient / Family Education for ongoing care 1 15 []  - 0 Complex (extensive) Patient / Family Education for ongoing care X- 1 10 Staff obtains Programmer, systems, Records, Test Results / Process Orders []  - 0 Staff telephones HHA, Nursing Homes / Clarify orders / etc []  - 0 Routine Transfer to another Facility (non-emergent condition) []  - 0 Routine Hospital Admission (non-emergent condition) []  - 0 New Admissions / Biomedical engineer / Ordering NPWT, Apligraf, etc. []  - 0 Emergency Hospital Admission (emergent condition) X- 1 10 Simple Discharge Coordination [name]. ([id number]) []  - 0 Complex (extensive) Discharge Coordination PROCESS - Special Needs []  - Pediatric / Minor Patient Management 0 []  - 0 Isolation Patient Management []  - 0 Hearing / Language / Visual special needs []  - 0 Assessment of Community  assistance (transportation, D/C planning, etc.) []  - 0 Additional assistance / Altered mentation []  - 0 Support Surface(s) Assessment (bed, cushion, seat, etc.) INTERVENTIONS - Wound Cleansing / Measurement X -  Simple Wound Cleansing - one wound 1 5 []  - 0 Complex Wound Cleansing - multiple wounds X- 1 5 Wound Imaging (photographs - any number of wounds) []  - 0 Wound Tracing (instead of photographs) X- 1 5 Simple Wound Measurement - one wound []  - 0 Complex Wound Measurement - multiple wounds INTERVENTIONS - Wound Dressings []  - Small Wound Dressing one or multiple wounds 0 X- 1 15 Medium Wound Dressing one or multiple wounds []  - 0 Large Wound Dressing one or multiple wounds []  - 0 Application of Medications - topical []  - 0 Application of Medications - injection INTERVENTIONS - Miscellaneous []  - External ear exam 0 []  - 0 Specimen Collection (cultures, biopsies, blood, body fluids, etc.) []  - 0 Specimen(s) / Culture(s) sent or taken to Lab for analysis []  - 0 Patient Transfer (multiple staff / Civil Service fast streamer / Similar devices) []  - 0 Simple Staple / Suture removal (25 or less) []  - 0 Complex Staple / Suture removal (26 or more) []  - 0 Hypo / Hyperglycemic Management (close monitor of Blood Glucose) []  - 0 Ankle / Brachial Index (ABI) - do not check if billed separately X- 1 5 Vital Signs Salak, Elizah H. ([id number]) Has the patient been seen at the hospital within the last three years: Yes Total Score: 80 Level Of Care: New/Established - Level 3 Electronic Signature(s) Signed: 01/19/2018 9:08:37 AM By: [name], BSN, RN, CWS, Kim RN, BSN Entered By: [name], BSN, RN, CWS, Kim on 01/14/2018 10:13:23 [name] ([id number]) -------------------------------------------------------------------------------- Encounter Discharge Information Details Patient Name: [name]. Date of Service: 01/14/2018 9:30 AM Medical Record Number: [id number] Patient Account Number:  [id number] Date of Birth/Sex: 11-Jul-1946 (72 y.o. F) Treating RN: Cornell Barman Primary Care Emmajane Altamura: Leanna Battles Other Clinician: Referring Almond Fitzgibbon: Leanna Battles Treating Steffanie Mingle/Extender: Tito Dine in Treatment: 2 Encounter Discharge Information Items Discharge Pain Level: 0 Discharge Condition: Stable Ambulatory Status: Ambulatory Discharge Destination: Home Transportation: Other Accompanied By: self Schedule Follow-up Appointment: Yes Medication Reconciliation completed and No provided to Patient/Care Ranisha Allaire: Provided on Clinical Summary of Care: 01/14/2018 Form Type Recipient Paper Patient LR Electronic Signature(s) Signed: 01/14/2018 10:19:16 AM By: [name], BSN, RN, CWS, Kim RN, BSN Entered By: [name], BSN, RN, CWS, Kim on 01/14/2018 10:19:15 [name] ([id number]) -------------------------------------------------------------------------------- Lower Extremity Assessment Details Patient Name: [name]. Date of Service: 01/14/2018 9:30 AM Medical Record Number: [id number] Patient Account Number: [id number] Date of Birth/Sex: [birth year] (72 y.o. F) Treating RN: Ahmed Prima Primary Care Daylan Juhnke: Leanna Battles Other Clinician: Referring Karmine Kauer: Leanna Battles Treating Elisandro Jarrett/Extender: Ricard Dillon Weeks in Treatment: 2 Electronic Signature(s) Signed: 01/15/2018 4:32:30 PM By: Alric Quan Entered By: Alric Quan on 01/14/2018 09:35:58 [name] ([id number]) -------------------------------------------------------------------------------- Multi Wound Chart Details Patient Name: [name]. Date of Service: 01/14/2018 9:30 AM Medical Record Number: [id number] Patient Account Number: [id number] Date of Birth/Sex: [birth year] (72 y.o. F) Treating RN: Cornell Barman Primary Care Olean Sangster: Leanna Battles Other Clinician: Referring Mikhail Hallenbeck: Leanna Battles Treating Octaviano Mukai/Extender: Tito Dine in  Treatment: 2 Vital Signs Height(in): 63 Pulse(bpm): 87 Weight(lbs): 120 Blood Pressure(mmHg): 141/75 Body Mass Index(BMI): 21 Temperature(F): 98.0 Respiratory Rate 16 (breaths/min): Photos: [1:No Photos] [N/A:N/A] Wound Location: [1:Coccyx] [N/A:N/A] Wounding Event: [1:Pressure Injury] [N/A:N/A] Primary Etiology: [1:Pressure Ulcer] [N/A:N/A] Comorbid History: [1:Cataracts, Hypertension, Type N/A II Diabetes, Osteoarthritis] Date Acquired: [1:10/20/2017] [N/A:N/A] Weeks of Treatment: [1:2] [N/A:N/A] Wound Status: [1:Open] [N/A:N/A] Measurements L x W x D [1:1x2x0.1] [N/A:N/A] (cm) Area (cm) : [1:1.571] [N/A:N/A] Volume (cm) : [  1:0.157] [N/A:N/A] % Reduction in Area: [1:76.80%] [N/A:N/A] % Reduction in Volume: [1:76.90%] [N/A:N/A] Classification: [1:Category/Stage III] [N/A:N/A] Exudate Amount: [1:Large] [N/A:N/A] Exudate Type: [1:Purulent] [N/A:N/A] Exudate Color: [1:yellow, brown, green] [N/A:N/A] Wound Margin: [1:Flat and Intact] [N/A:N/A] Granulation Amount: [1:Large (67-100%)] [N/A:N/A] Granulation Quality: [1:Pink] [N/A:N/A] Necrotic Amount: [1:Small (1-33%)] [N/A:N/A] Exposed Structures: [1:Fascia: No Fat Layer (Subcutaneous Tissue) Exposed: No Tendon: No Muscle: No Joint: No Bone: No] [N/A:N/A] Epithelialization: [1:Small (1-33%)] [N/A:N/A] Periwound Skin Texture: [1:Excoriation: No Induration: No Callus: No Crepitus: No Rash: No Scarring: No] [N/A:N/A] Periwound Skin Moisture: Maceration: Yes N/A N/A Dry/Scaly: No Periwound Skin Color: Atrophie Blanche: No N/A N/A Cyanosis: No Ecchymosis: No Erythema: No Hemosiderin Staining: No Mottled: No Pallor: No Rubor: No Temperature: No Abnormality N/A N/A Tenderness on Palpation: No N/A N/A Wound Preparation: Ulcer Cleansing: N/A N/A Rinsed/Irrigated with Saline Topical Anesthetic Applied: Other: lidocaine 4% Treatment Notes Electronic Signature(s) Signed: 01/14/2018 4:49:21 PM By: Linton Ham  MD Entered By: Linton Ham on 01/14/2018 10:17:58 [name] ([id number]) -------------------------------------------------------------------------------- [place] Details Patient Name: [name] Date of Service: 01/14/2018 9:30 AM Medical Record Number: [id number] Patient Account Number: [id number] Date of Birth/Sex: 03/28/1946 (72 y.o. F) Treating RN: Cornell Barman Primary Care Texanna Hilburn: Leanna Battles Other Clinician: Referring Johari Pinney: Leanna Battles Treating Delainie Chavana/Extender: Tito Dine in Treatment: 2 Active Inactive ` Abuse / Safety / Falls / Self Care Management Nursing Diagnoses: Impaired physical mobility Goals: Patient will not experience any injury related to falls Date Initiated: 12/31/2017 Target Resolution Date: 03/21/2018 Goal Status: Active Interventions: Assess fall risk on admission and as needed Notes: ` Orientation to the Wound Care Program Nursing Diagnoses: Knowledge deficit related to the wound healing center program Goals: Patient/caregiver will verbalize understanding of the [employer] Date Initiated: 12/31/2017 Target Resolution Date: 03/21/2018 Goal Status: Active Interventions: Provide education on orientation to the wound center Notes: ` Wound/Skin Impairment Nursing Diagnoses: Impaired tissue integrity Goals: Ulcer/skin breakdown will heal within 14 weeks Date Initiated: 12/31/2017 Target Resolution Date: 01/17/2018 Goal Status: Active Interventions: CACHE, DECOURSEY ([id number]) Assess patient/caregiver ability to obtain necessary supplies Assess patient/caregiver ability to perform ulcer/skin care regimen upon admission and as needed Assess ulceration(s) every visit Notes: Electronic Signature(s) Signed: 01/19/2018 9:08:37 AM By: [name], BSN, RN, CWS, Kim RN, BSN Entered By: [name], BSN, RN, CWS, Kim on 01/14/2018 10:00:22 [name]  ([id number]) -------------------------------------------------------------------------------- Pain Assessment Details Patient Name: [name]. Date of Service: 01/14/2018 9:30 AM Medical Record Number: [id number] Patient Account Number: [id number] Date of Birth/Sex: 1945-12-08 (72 y.o. F) Treating RN: Ahmed Prima Primary Care Janett Kamath: Leanna Battles Other Clinician: Referring Mihail Prettyman: Leanna Battles Treating Nyaira Hodgens/Extender: Ricard Dillon Weeks in Treatment: 2 Active Problems Location of Pain Severity and Description of Pain Patient Has Paino No Site Locations Pain Management and Medication Current Pain Management: Electronic Signature(s) Signed: 01/15/2018 4:32:30 PM By: Alric Quan Entered By: Alric Quan on 01/14/2018 09:28:21 [name] ([id number]) -------------------------------------------------------------------------------- Patient/Caregiver Education Details Patient Name: [name]. Date of Service: 01/14/2018 9:30 AM Medical Record Number: [id number] Patient Account Number: [id number] Date of Birth/Gender: 1945/11/30 (72 y.o. F) Treating RN: Cornell Barman Primary Care Physician: Leanna Battles Other Clinician: Referring Physician: Leanna Battles Treating Physician/Extender: Tito Dine in Treatment: 2 Education Assessment Education Provided To: Patient Education Topics Provided Wound/Skin Impairment: Handouts: Caring for Your Ulcer, Other: continue dressing changes as prescribed Methods: Demonstration, Explain/Verbal Responses: State content correctly Electronic Signature(s) Signed: 01/19/2018 9:08:37 AM By: [name], BSN, RN, CWS,  Maudie Mercury RN, BSN Entered By: [name], BSN, RN, CWS, Kim on 01/14/2018 10:19:42 [name] ([id number]) -------------------------------------------------------------------------------- Wound Assessment Details Patient Name: [name]. Date of Service: 01/14/2018 9:30 AM Medical Record  Number: [id number] Patient Account Number: [id number] Date of Birth/Sex: 1946-01-25 (72 y.o. F) Treating RN: Ahmed Prima Primary Care Foye Haggart: Leanna Battles Other Clinician: Referring Wentworth Edelen: Leanna Battles Treating Atley Neubert/Extender: Ricard Dillon Weeks in Treatment: 2 Wound Status Wound Number: 1 Primary Pressure Ulcer Etiology: Wound Location: Coccyx Wound Status: Open Wounding Event: Pressure Injury Comorbid Cataracts, Hypertension, Type II Diabetes, Date Acquired: 10/20/2017 History: Osteoarthritis Weeks Of Treatment: 2 Clustered Wound: No Photos Photo Uploaded By: Alric Quan on 01/14/2018 15:26:39 Wound Measurements Length: (cm) 1 Width: (cm) 2 Depth: (cm) 0.1 Area: (cm) 1.571 Volume: (cm) 0.157 % Reduction in Area: 76.8% % Reduction in Volume: 76.9% Epithelialization: Small (1-33%) Tunneling: No Undermining: No Wound Description Classification: Category/Stage III Wound Margin: Flat and Intact Exudate Amount: Large Exudate Type: Purulent Exudate Color: yellow, brown, green Foul Odor After Cleansing: No Slough/Fibrino Yes Wound Bed Granulation Amount: Large (67-100%) Exposed Structure Granulation Quality: Pink Fascia Exposed: No Necrotic Amount: Small (1-33%) Fat Layer (Subcutaneous Tissue) Exposed: No Necrotic Quality: Adherent Slough Tendon Exposed: No Muscle Exposed: No Joint Exposed: No Bone Exposed: No Periwound Skin Texture Tredway, Ramonda H. ([id number]) Texture Color No Abnormalities Noted: No No Abnormalities Noted: No Callus: No Atrophie Blanche: No Crepitus: No Cyanosis: No Excoriation: No Ecchymosis: No Induration: No Erythema: No Rash: No Hemosiderin Staining: No Scarring: No Mottled: No Pallor: No Moisture Rubor: No No Abnormalities Noted: No Dry / Scaly: No Temperature / Pain Maceration: Yes Temperature: No Abnormality Wound Preparation Ulcer Cleansing: Rinsed/Irrigated with Saline Topical Anesthetic  Applied: Other: lidocaine 4%, Treatment Notes Wound #1 (Coccyx) 1. Cleansed with: Clean wound with Normal Saline 2. Anesthetic Topical Lidocaine 4% cream to wound bed prior to debridement 4. Dressing Applied: Other dressing (specify in notes) 5. Secondary Dressing Applied Bordered Foam Dressing Notes [employer] Electronic Signature(s) Signed: 01/15/2018 4:32:30 PM By: Alric Quan Entered By: Alric Quan on 01/14/2018 09:36:50 [name] ([id number]) -------------------------------------------------------------------------------- Vitals Details Patient Name: [name] Date of Service: 01/14/2018 9:30 AM Medical Record Number: [id number] Patient Account Number: [id number] Date of Birth/Sex: [birth year] (72 y.o. F) Treating RN: Ahmed Prima Primary Care Daunte Oestreich: Leanna Battles Other Clinician: Referring Epic Tribbett: Leanna Battles Treating Aaren Atallah/Extender: Tito Dine in Treatment: 2 Vital Signs Time Taken: 09:29 Temperature (F): 98.0 Height (in): 63 Pulse (bpm): 87 Weight (lbs): 120 Respiratory Rate (breaths/min): 16 Body Mass Index (BMI): 21.3 Blood Pressure (mmHg): 141/75 Reference Range: 80 - 120 mg / dl Electronic Signature(s) Signed: 01/15/2018 4:32:30 PM By: Alric Quan Entered By: Alric Quan on 01/14/2018 09:30:17

## 2018-01-19 NOTE — Progress Notes (Signed)
MARCO, Strong ([id number]) Visit Report for 01/14/2018 HPI Details Patient Name: [name], [name]. Date of Service: 01/14/2018 9:30 AM Medical Record Number: [id number] Patient Account Number: [id number] Date of Birth/Sex: June 17, 1946 (72 y.o. 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 She had a complicated postoperative course with respiratory failure, acute bilateral watershed cerebral infarcts, reintubation, tracheostomy, postoperative healing by secondary intention with NPWT, blood transfusion; she was treated with broad-spectrum antibiotic (meropenem, vancomycin, Zosyn, Unasyn) and antifungal (anidulafungin). She was also noted to have a sacral pressure ulcer during this admission, treated with Santyl. She was discharged to inpatient rehabilitation, then to SNF/rehab ([name]) with plans to discharge home this weekend. She's been experiencing multiple complications with ileostomy since arriving to [name], staff at facility unable to maintain appliance with noted peristomal skin breakdown. We discussed at length ostomy care and skin related challenges with ileostomy effluent. Her daughter-in-law accompanies her today, with multiple questions answered. She presents with a stage 3 sacral/coccyx pressure ulcer. She is ambulatory and has a foam cushion in her wheelchair. The facility has been placing collagen per the daughter-in-law. 01/07/18; this is a patient who came in predominantly related to issues  with a new ileostomy. Apparently changes were made and the ostomy is doing better. She is of course going to have a large amount of ostomy output from an ileostomy and she seems aware of that. During the course of last week's visit she was incidentally discovered to have a pressure ulcer on the sacrum. Using silver alginate this is smaller 01/13/18; the patient has a pressure ulcer on her left gluteal area that was discovered incidentally after review for ileostomy issues. She is at [name] skilled facility and she is apparently going home next Tuesday. We had given orders for so over alginate and border foam to the pressure ulcer which is stage 3 however she had Xeroform on this today. Electronic Signature(s) Signed: 01/14/2018 4:49:21 PM By: Joanna Ham MD Entered By: [name] on 01/14/2018 10:19:03 [name] ([id number]) -------------------------------------------------------------------------------- Physical Exam Details Patient Name: [name]. Date of Service: 01/14/2018 9:30 AM Medical Record Number: [id number] Patient Account Number: [id number] Date of Birth/Sex: 02-Dec-1945 (72 y.o. F) Treating RN: [name] Primary Care Provider: [facility] Other Clinician: Referring Provider: [facility] Treating Provider/Extender: Ricard Dillon Weeks in Treatment: 2 Constitutional Sitting or standing Blood Pressure is within target range for patient.. Pulse regular and within target range for patient.Marland Kitchen Respirations regular, non-labored and within target range.. Temperature is normal and within the target range for the patient.. patient appears to be doing better week to week. Notes when exam; area over the lower sacral area which is a stage III pressure ulcer. This has surrounding maceration. Mild amount of adherent debris although I didn't think this required debridement. Dimensions were down slightly versus last week. There is no evidence of surrounding  infection Electronic Signature(s) Signed: 01/14/2018 4:49:21 PM By: Joanna Ham MD Entered By: [name] on 01/14/2018 10:20:04 [name] ([id number]) -------------------------------------------------------------------------------- Physician Orders Details Patient Name: [name] Date of Service: 01/14/2018 9:30 AM Medical Record Number: [id number] Patient Account  Number: [id number] Date of Birth/Sex: 07-21-1946 (72 y.o. F) Treating RN: [name] Primary Care Provider: [facility] Other Clinician: Referring Provider: [facility] Treating Provider/Extender: [name] in Treatment: 2 Verbal / Phone Orders: No Diagnosis Coding Wound Cleansing o Clean wound with Normal Saline. o May Shower, gently pat wound dry prior to applying new dressing. Anesthetic (add to Medication List) o Topical Lidocaine 4% cream applied to wound bed prior to debridement (In Clinic Only). Primary Wound Dressing o Silver Alginate - Coccyx Secondary Dressing o Boardered Foam Dressing - Coccyx Dressing Change Frequency o Change dressing every other day. Follow-up Appointments Wound #1 Coccyx o Return Appointment in 2 weeks. Electronic Signature(s) Signed: 01/14/2018 4:49:21 PM By: [facility]anna Ham MD Signed: 01/19/2018 9:08:37 AM By: [name], BSN, RN, CWS, Kim RN, BSN Entered By: [name], BSN, RN, CWS, [name] on 01/14/2018 10:01:51 [name], [name] ([id number]) -------------------------------------------------------------------------------- Problem List Details Patient Name: [name] Date of Service: 01/14/2018 9:30 AM Medical Record Number: [id number] Patient Account Number: [id number] Date of Birth/Sex: 06/13/1946 (72 y.o. F) Treating RN: [name] Primary Care Provider: [facility] Other Clinician: Referring Provider: [facility] Treating Provider/Extender: [name] in Treatment: 2 Active Problems ICD-10 Impacting  Encounter Code Description Active Date Wound Healing Diagnosis Z93.2 Ileostomy status 12/31/2017 Yes R54 Age-related physical debility 12/31/2017 Yes S30.92XS Unspecified superficial injury of abdominal wall, sequela 12/31/2017 Yes L89.153 Pressure ulcer of sacral region, stage 3 12/31/2017 Yes Inactive Problems Resolved Problems Electronic Signature(s) Signed: 01/14/2018 4:49:21 PM By: [facility]anna Ham MD Entered By: [name] on 01/14/2018 10:17:48 [name] ([id number]) -------------------------------------------------------------------------------- Progress Note Details Patient Name: [name]. Date of Service: 01/14/2018 9:30 AM Medical Record Number: [id number] Patient Account Number: [id number] Date of Birth/Sex: 1946-06-20 (72 y.o. 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 She had a complicated postoperative course with respiratory failure, acute bilateral watershed cerebral infarcts, reintubation, tracheostomy, postoperative healing by secondary intention with NPWT, blood transfusion; she was treated with broad-spectrum antibiotic (meropenem, vancomycin, Zosyn, Unasyn) and antifungal (anidulafungin). She was also noted to have a sacral pressure ulcer during this admission, treated with Santyl. She was discharged to inpatient rehabilitation, then to SNF/rehab ([facility]) with plans to discharge home this weekend. She's been experiencing multiple complications with ileostomy since  arriving to [facility], staff at facility unable to maintain appliance with noted peristomal skin breakdown. We discussed at length ostomy care and skin related challenges with ileostomy effluent. Her daughter-in-law accompanies her today, with multiple questions answered. She presents with a stage 3 sacral/coccyx pressure ulcer. She is ambulatory and has a foam cushion in her wheelchair. The facility has been placing collagen per the daughter-in-law. 01/07/18; this is a patient who came in predominantly related to issues with a new ileostomy. Apparently changes were made and the ostomy is doing better. She is of course going to have a large amount of ostomy output from an ileostomy and she seems aware of that. During the course of last week's visit she was incidentally discovered to have a pressure ulcer on the sacrum. Using silver alginate this is smaller 01/13/18; the patient has  a pressure ulcer on her left gluteal area that was discovered incidentally after review for ileostomy issues. She is at [facility] skilled facility and she is apparently going home next Tuesday. We had given orders for so over alginate and border foam to the pressure ulcer which is stage 3 however she had Xeroform on this today. Objective Constitutional Sitting or standing Blood Pressure is within target range for patient.. Pulse regular and within target range for patient.Marland Kitchen Respirations regular, non-labored and within target range.. Temperature is normal and within the target range for the patient.. patient appears to be doing better week to week. Vitals Time Taken: 9:29 AM, Height: 63 in, Weight: 120 lbs, BMI: 21.3, Temperature: 98.0 F, Pulse: 87 bpm, Respiratory Rate: 16 breaths/min, Blood Pressure: 141/75 mmHg. General Notes: when exam; area over the lower sacral area which is a stage III pressure ulcer. This has surrounding maceration. Mild amount of adherent debris although I didn't think this required  debridement. Dimensions were down slightly versus last week. There is no evidence of surrounding infection Integumentary (Hair, Skin) Pilz, Landrey H. ([id number]) Wound #1 status is Open. Original cause of wound was Pressure Injury. The wound is located on the Coccyx. The wound measures 1cm length x 2cm width x 0.1cm depth; 1.571cm^2 area and 0.157cm^3 volume. There is no tunneling or undermining noted. There is a large amount of purulent drainage noted. The wound margin is flat and intact. There is large (67-100%) pink granulation within the wound bed. There is a small (1-33%) amount of necrotic tissue within the wound bed including Adherent Slough. The periwound skin appearance exhibited: Maceration. The periwound skin appearance did not exhibit: Callus, Crepitus, Excoriation, Induration, Rash, Scarring, Dry/Scaly, Atrophie Blanche, Cyanosis, Ecchymosis, Hemosiderin Staining, Mottled, Pallor, Rubor, Erythema. Periwound temperature was noted as No Abnormality. Assessment Active Problems ICD-10 Z93.2 - Ileostomy status R54 - Age-related physical debility S30.92XS - Unspecified superficial injury of abdominal wall, sequela L89.153 - Pressure ulcer of sacral region, stage 3 Plan Wound Cleansing: Clean wound with Normal Saline. May Shower, gently pat wound dry prior to applying new dressing. Anesthetic (add to Medication List): Topical Lidocaine 4% cream applied to wound bed prior to debridement (In Clinic Only). Primary Wound Dressing: Silver Alginate - Coccyx Secondary Dressing: Boardered Foam Dressing - Coccyx Dressing Change Frequency: Change dressing every other day. Follow-up Appointments: Wound #1 Coccyx: Return Appointment in 2 weeks. #1 continue with silver alginate over the coccyx area border foam dressing I think this can be changed every second day #2 the patient is transitioning from [place] to home next week we will see her back in 2 weeks. At this point I'm not sure  which home health company will be looking after her Electronic Signature(s) Signed: 01/14/2018 4:49:21 PM By: [facility] MD Entered By: [name] on 01/14/2018 10:20:51 [name] ([id number]) -------------------------------------------------------------------------------- SuperBill Details Patient Name: [name]. Date of Service: 01/14/2018 Medical Record Number: [id number] Patient Account Number: [id number] Date of Birth/Sex: 1946/08/12 (72 y.o. F) Treating RN: [name] Primary Care Provider: [facility] Other Clinician: Referring Provider: [facility] Treating Provider/Extender: [name] in Treatment: 2 Diagnosis Coding ICD-10 Codes Code Description Z93.2 Ileostomy status R54 Age-related physical debility S30.92XS Unspecified superficial injury of abdominal wall, sequela L89.153 Pressure ulcer of sacral region, stage 3 Facility Procedures CPT4 Code: [id number] Description: 99213 - WOUND CARE VISIT-LEV 3 EST PT Modifier: Quantity: 1 Physician Procedures CPT4 Code: [id number] Description: 99242 - WC PHYS LEVEL 2 - EST PT ICD-10  Diagnosis Description L89.153 Pressure ulcer of sacral region, stage 3 Modifier: Quantity: 1 Electronic Signature(s) Signed: 01/14/2018 4:49:21 PM By: [facility] MD Entered By: [name] on 01/14/2018 10:21:19

## 2018-01-26 DIAGNOSIS — I10 Essential (primary) hypertension: Secondary | ICD-10-CM | POA: Diagnosis not present

## 2018-01-26 DIAGNOSIS — Z932 Ileostomy status: Secondary | ICD-10-CM | POA: Diagnosis not present

## 2018-01-26 DIAGNOSIS — I639 Cerebral infarction, unspecified: Secondary | ICD-10-CM | POA: Diagnosis not present

## 2018-01-26 DIAGNOSIS — Z9049 Acquired absence of other specified parts of digestive tract: Secondary | ICD-10-CM | POA: Diagnosis not present

## 2018-01-27 DIAGNOSIS — S0180XD Unspecified open wound of other part of head, subsequent encounter: Secondary | ICD-10-CM | POA: Diagnosis not present

## 2018-01-27 DIAGNOSIS — S01402D Unspecified open wound of left cheek and temporomandibular area, subsequent encounter: Secondary | ICD-10-CM | POA: Diagnosis not present

## 2018-01-28 ENCOUNTER — Encounter: Payer: Medicare Other | Admitting: Internal Medicine

## 2018-01-28 DIAGNOSIS — L89323 Pressure ulcer of left buttock, stage 3: Secondary | ICD-10-CM | POA: Diagnosis not present

## 2018-01-28 DIAGNOSIS — L89153 Pressure ulcer of sacral region, stage 3: Secondary | ICD-10-CM | POA: Diagnosis not present

## 2018-01-28 DIAGNOSIS — Z932 Ileostomy status: Secondary | ICD-10-CM | POA: Diagnosis not present

## 2018-02-02 NOTE — Progress Notes (Signed)
MYSTIC, LABO ([id number]) Visit Report for 01/28/2018 HPI Details Patient Name: [name], [name]. Date of Service: 01/28/2018 10:00 AM Medical Record Number: [id number] Patient Account Number: [id number] Date of Birth/Sex: 09/23/1946 (72 y.o. 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 She had a complicated postoperative course with respiratory failure, acute bilateral watershed cerebral infarcts, reintubation, tracheostomy, postoperative healing by secondary intention with NPWT, blood transfusion; she was treated with broad-spectrum antibiotic (meropenem, vancomycin, Zosyn, Unasyn) and antifungal (anidulafungin). She was also noted to have a sacral pressure ulcer during this admission, treated with Santyl. She was discharged to inpatient rehabilitation, then to SNF/rehab ([place]) with plans to discharge home this weekend. She's been experiencing multiple complications with ileostomy since arriving to [facility], staff at facility unable to maintain appliance with noted peristomal skin breakdown. We discussed at length ostomy care and skin related challenges with ileostomy effluent. Her daughter-in-law accompanies her today, with multiple questions answered. She presents with a stage 3 sacral/coccyx pressure ulcer. She is ambulatory and has a foam cushion in her wheelchair. The facility has been placing collagen per the daughter-in-law. 01/07/18; this is a patient who came in predominantly related to  issues with a new ileostomy. Apparently changes were made and the ostomy is doing better. She is of course going to have a large amount of ostomy output from an ileostomy and she seems aware of that. During the course of last week's visit she was incidentally discovered to have a pressure ulcer on the sacrum. Using silver alginate this is smaller 01/13/18; the patient has a pressure ulcer on her left gluteal area that was discovered incidentally after review for ileostomy issues. She is at [facility] skilled facility and she is apparently going home next Tuesday. We had given orders for so over alginate and border foam to the pressure ulcer which is stage 3 however she had Xeroform on this today. 4//10/19; the patient has a pressure ulcer on her left gluteal in close proximity to her coccyx. This is smaller than her last visit. There is no surrounding infection. She actually did not leave the facility 2 weeks ago is average size but she is going home next Monday. oShe is having a lot of trouble with adherence of the ostomy bag. Ostomy effluent is draining onto her skin causing irritation. Our intake nurse called the ostomy nurse at the hospital to come over and have a look at this Electronic Signature(s) Signed: 01/28/2018 5:09:03 PM By: Linton Ham MD Entered By: Linton Ham on 01/28/2018 11:02:23 [name] ([id number]) -------------------------------------------------------------------------------- Physical Exam Details Patient Name: [name]. Date of Service: 01/28/2018 10:00 AM Medical Record Number: [id number] Patient Account Number: [id number] Date of Birth/Sex: 09/01/1946 (72 y.o. F) Treating RN: Cornell Barman Primary Care Provider: Leanna Battles Other Clinician: Referring Provider: Leanna Battles Treating Provider/Extender: Ricard Dillon Weeks in Treatment: 4 Constitutional Sitting or standing Blood Pressure is within target range for patient.. Pulse regular  and within target range for patient.Marland Kitchen Respirations regular, non-labored and within target range.. Temperature is normal and within the target range for the patient.Marland Kitchen appears in no distress. Eyes Conjunctivae clear.  No discharge. Respiratory Respiratory effort is easy and symmetric bilaterally. Rate is normal at rest and on room air.. Cardiovascular she appears well hydrated. Gastrointestinal (GI) protruding ostomy site. Surrounding skin somewhat irritated. There is no evidence of cellulitis and I don't think this represents a fungal dermatitis either.. Integumentary (Hair, Skin) . Notes exam; areas over the lower sacral area on the left buttock. Stage III pressure ulcer. No surrounding maceration this time. No debris was seen on the wound surface which looks like healthy granulation. The dimensions were smaller. No evidence of surrounding infection Electronic Signature(s) Signed: 01/28/2018 5:09:03 PM By: Linton Ham MD Entered By: Linton Ham on 01/28/2018 11:04:15 [name] ([id number]) -------------------------------------------------------------------------------- Physician Orders Details Patient Name: [name] Date of Service: 01/28/2018 10:00 AM Medical Record Number: [id number] Patient Account Number: [id number] Date of Birth/Sex: [birth year] (72 y.o. F) Treating RN: Cornell Barman Primary Care Provider: Leanna Battles Other Clinician: Referring Provider: Leanna Battles Treating Provider/Extender: Tito Dine in Treatment: 4 Verbal / Phone Orders: No Diagnosis Coding Wound Cleansing Wound #1 Coccyx o Clean wound with Normal Saline. o May Shower, gently pat wound dry prior to applying new dressing. Anesthetic (add to Medication List) Wound #1 Coccyx o Topical Lidocaine 4% cream applied to wound bed prior to debridement (In Clinic Only). Primary Wound Dressing Wound #1 Coccyx o Silver Alginate - Coccyx Secondary Dressing Wound #1  Coccyx o Boardered Foam Dressing - Coccyx Dressing Change Frequency Wound #1 Coccyx o Change dressing every other day. Follow-up Appointments Wound #1 Coccyx o Return Appointment in 2 weeks. Electronic Signature(s) Signed: 01/28/2018 5:09:03 PM By: Linton Ham MD Signed: 01/28/2018 5:11:52 PM By: Gretta Cool, BSN, RN, CWS, Kim RN, BSN Entered By: Gretta Cool, BSN, RN, CWS, Kim on 01/28/2018 10:25:01 YURI, FLENER ([id number]) -------------------------------------------------------------------------------- Problem List Details Patient Name: [name] Date of Service: 01/28/2018 10:00 AM Medical Record Number: [id number] Patient Account Number: [id number] Date of Birth/Sex: 07-03-1946 (72 y.o. F) Treating RN: Cornell Barman Primary Care Provider: Leanna Battles Other Clinician: Referring Provider: Leanna Battles Treating Provider/Extender: Tito Dine in Treatment: 4 Active Problems ICD-10 Impacting Encounter Code Description Active Date Wound Healing Diagnosis L89.153 Pressure ulcer of sacral region, stage 3 12/31/2017 Yes Z93.2 Ileostomy status 12/31/2017 Yes R54 Age-related physical debility 12/31/2017 Yes S30.92XS Unspecified superficial injury of abdominal wall, sequela 12/31/2017 Yes Inactive Problems Resolved Problems Electronic Signature(s) Signed: 01/28/2018 5:09:03 PM By: Linton Ham MD Entered By: Linton Ham on 01/28/2018 10:58:49 [name] ([id number]) -------------------------------------------------------------------------------- Progress Note Details Patient Name: [name]. Date of Service: 01/28/2018 10:00 AM Medical Record Number: [id number] Patient Account Number: [id number] Date of Birth/Sex: 03/01/1946 (72 y.o. 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 She had a complicated postoperative course with respiratory failure, acute bilateral watershed cerebral infarcts, reintubation, tracheostomy, postoperative healing by secondary intention with NPWT, blood transfusion; she was treated with broad-spectrum antibiotic (meropenem, vancomycin, Zosyn, Unasyn) and antifungal (anidulafungin). She was also noted to have a sacral pressure ulcer during this admission, treated with Santyl. She was discharged to inpatient rehabilitation, then to SNF/rehab ([place]) with plans to discharge home this  weekend. She's been experiencing multiple complications with ileostomy since arriving to [facility], staff at facility unable to maintain appliance with noted peristomal skin breakdown. We discussed at length ostomy care and skin related challenges with ileostomy effluent. Her daughter-in-law accompanies her today, with multiple questions answered. She presents with a stage 3 sacral/coccyx pressure ulcer. She is ambulatory and has a foam cushion in her wheelchair. The facility has been placing collagen per the daughter-in-law. 01/07/18; this is a patient who came in predominantly related to issues with a new ileostomy. Apparently changes were made and the ostomy is doing better. She is of course going to have a large amount of ostomy output from an ileostomy and she seems aware of that. During the course of last week's visit she was incidentally discovered to have a pressure ulcer on the sacrum. Using silver alginate this is smaller 01/13/18; the patient has a pressure ulcer on her left gluteal area that was discovered incidentally after review for  ileostomy issues. She is at [facility] skilled facility and she is apparently going home next Tuesday. We had given orders for so over alginate and border foam to the pressure ulcer which is stage 3 however she had Xeroform on this today. 4//10/19; the patient has a pressure ulcer on her left gluteal in close proximity to her coccyx. This is smaller than her last visit. There is no surrounding infection. She actually did not leave the facility 2 weeks ago is average size but she is going home next Monday. She is having a lot of trouble with adherence of the ostomy bag. Ostomy effluent is draining onto her skin causing irritation. Our intake nurse called the ostomy nurse at the hospital to come over and have a look at this Objective Constitutional Sitting or standing Blood Pressure is within target range for patient.. Pulse regular and within target range for patient.Marland Kitchen Respirations regular, non-labored and within target range.. Temperature is normal and within the target range for the patient.Marland Kitchen appears in no distress. Vitals Time Taken: 9:57 AM, Height: 63 in, Weight: 120 lbs, BMI: 21.3, Temperature: 97.6 F, Pulse: 86 bpm, Respiratory Rate: 16 breaths/min, Blood Pressure: 133/78 mmHg. Eyes Tapanes, Nigel H. ([id number]) Conjunctivae clear. No discharge. Respiratory Respiratory effort is easy and symmetric bilaterally. Rate is normal at rest and on room air.. Cardiovascular she appears well hydrated. Gastrointestinal (GI) protruding ostomy site. Surrounding skin somewhat irritated. There is no evidence of cellulitis and I don't think this represents a fungal dermatitis either.. General Notes: exam; areas over the lower sacral area on the left buttock. Stage III pressure ulcer. No surrounding maceration this time. No debris was seen on the wound surface which looks like healthy granulation. The dimensions were smaller. No evidence of surrounding infection Integumentary (Hair, Skin) Wound  #1 status is Open. Original cause of wound was Pressure Injury. The wound is located on the Coccyx. The wound measures 0.9cm length x 0.9cm width x 0.1cm depth; 0.636cm^2 area and 0.064cm^3 volume. There is no tunneling or undermining noted. There is a medium amount of serous drainage noted. The wound margin is flat and intact. There is large (67-100%) pink granulation within the wound bed. There is a small (1-33%) amount of necrotic tissue within the wound bed including Adherent Slough. The periwound skin appearance exhibited: Maceration. The periwound skin appearance did not exhibit: Callus, Crepitus, Excoriation, Induration, Rash, Scarring, Dry/Scaly, Atrophie Blanche, Cyanosis, Ecchymosis, Hemosiderin Staining, Mottled, Pallor, Rubor, Erythema. Periwound temperature was noted as No Abnormality. Assessment Active Problems  ICD-10 L89.153 - Pressure ulcer of sacral region, stage 3 Z93.2 - Ileostomy status R54 - Age-related physical debility S30.92XS - Unspecified superficial injury of abdominal wall, sequela Plan Wound Cleansing: Wound #1 Coccyx: Clean wound with Normal Saline. May Shower, gently pat wound dry prior to applying new dressing. Anesthetic (add to Medication List): Wound #1 Coccyx: Topical Lidocaine 4% cream applied to wound bed prior to debridement (In Clinic Only). Primary Wound Dressing: Wound #1 Coccyx: Silver Alginate - Coccyx Tomaro, Bethann H. ([id number]) Secondary Dressing: Wound #1 Coccyx: Boardered Foam Dressing - Coccyx Dressing Change Frequency: Wound #1 Coccyx: Change dressing every other day. Follow-up Appointments: Wound #1 Coccyx: Return Appointment in 2 weeks. #1 we will continue with sewer alginate and border foam to the coccyx area wound #2 ostomy issues were addressed by the wound ostomy nurse from the hospital who graciously came to look at this patient. The problem is being caused by a leakage of ostomy fluid. A different form of ostomy seal was  recommended Electronic Signature(s) Signed: 01/28/2018 5:09:03 PM By: Linton Ham MD Entered By: Linton Ham on 01/28/2018 11:05:17 [name] ([id number]) -------------------------------------------------------------------------------- SuperBill Details Patient Name: [name] Date of Service: 01/28/2018 Medical Record Number: [id number] Patient Account Number: [id number] Date of Birth/Sex: [birth year] (72 y.o. F) Treating RN: Cornell Barman Primary Care Provider: Leanna Battles Other Clinician: Referring Provider: Leanna Battles Treating Provider/Extender: Tito Dine in Treatment: 4 Diagnosis Coding ICD-10 Codes Code Description L89.153 Pressure ulcer of sacral region, stage 3 Z93.2 Ileostomy status R54 Age-related physical debility S30.92XS Unspecified superficial injury of abdominal wall, sequela Facility Procedures CPT4 Code: [id number] Description: [phone] - WOUND CARE VISIT-LEV 2 EST PT Modifier: Quantity: 1 Physician Procedures CPT4 Code: [id number] Description: 41660 - WC PHYS LEVEL 3 - EST PT ICD-10 Diagnosis Description L89.153 Pressure ulcer of sacral region, stage 3 Z93.2 Ileostomy status Modifier: Quantity: 1 Electronic Signature(s) Signed: 01/28/2018 5:09:03 PM By: Linton Ham MD Entered By: Linton Ham on 01/28/2018 11:05:46

## 2018-02-03 DIAGNOSIS — F17210 Nicotine dependence, cigarettes, uncomplicated: Secondary | ICD-10-CM | POA: Diagnosis not present

## 2018-02-03 DIAGNOSIS — M1990 Unspecified osteoarthritis, unspecified site: Secondary | ICD-10-CM | POA: Diagnosis not present

## 2018-02-03 DIAGNOSIS — E46 Unspecified protein-calorie malnutrition: Secondary | ICD-10-CM | POA: Diagnosis not present

## 2018-02-03 DIAGNOSIS — R1312 Dysphagia, oropharyngeal phase: Secondary | ICD-10-CM | POA: Diagnosis not present

## 2018-02-03 DIAGNOSIS — E785 Hyperlipidemia, unspecified: Secondary | ICD-10-CM | POA: Diagnosis not present

## 2018-02-03 DIAGNOSIS — Z432 Encounter for attention to ileostomy: Secondary | ICD-10-CM | POA: Diagnosis not present

## 2018-02-03 DIAGNOSIS — J9611 Chronic respiratory failure with hypoxia: Secondary | ICD-10-CM | POA: Diagnosis not present

## 2018-02-03 DIAGNOSIS — I2510 Atherosclerotic heart disease of native coronary artery without angina pectoris: Secondary | ICD-10-CM | POA: Diagnosis not present

## 2018-02-03 DIAGNOSIS — F329 Major depressive disorder, single episode, unspecified: Secondary | ICD-10-CM | POA: Diagnosis not present

## 2018-02-03 DIAGNOSIS — F5103 Paradoxical insomnia: Secondary | ICD-10-CM | POA: Diagnosis not present

## 2018-02-03 DIAGNOSIS — I10 Essential (primary) hypertension: Secondary | ICD-10-CM | POA: Diagnosis not present

## 2018-02-03 DIAGNOSIS — I69392 Facial weakness following cerebral infarction: Secondary | ICD-10-CM | POA: Diagnosis not present

## 2018-02-03 DIAGNOSIS — K219 Gastro-esophageal reflux disease without esophagitis: Secondary | ICD-10-CM | POA: Diagnosis not present

## 2018-02-03 DIAGNOSIS — E1151 Type 2 diabetes mellitus with diabetic peripheral angiopathy without gangrene: Secondary | ICD-10-CM | POA: Diagnosis not present

## 2018-02-03 DIAGNOSIS — J449 Chronic obstructive pulmonary disease, unspecified: Secondary | ICD-10-CM | POA: Diagnosis not present

## 2018-02-03 DIAGNOSIS — D6489 Other specified anemias: Secondary | ICD-10-CM | POA: Diagnosis not present

## 2018-02-03 DIAGNOSIS — F3289 Other specified depressive episodes: Secondary | ICD-10-CM | POA: Diagnosis not present

## 2018-02-03 DIAGNOSIS — F419 Anxiety disorder, unspecified: Secondary | ICD-10-CM | POA: Diagnosis not present

## 2018-02-03 DIAGNOSIS — Z7902 Long term (current) use of antithrombotics/antiplatelets: Secondary | ICD-10-CM | POA: Diagnosis not present

## 2018-02-03 DIAGNOSIS — Z9181 History of falling: Secondary | ICD-10-CM | POA: Diagnosis not present

## 2018-02-03 DIAGNOSIS — I69315 Cognitive social or emotional deficit following cerebral infarction: Secondary | ICD-10-CM | POA: Diagnosis not present

## 2018-02-03 DIAGNOSIS — K589 Irritable bowel syndrome without diarrhea: Secondary | ICD-10-CM | POA: Diagnosis not present

## 2018-02-03 DIAGNOSIS — I69354 Hemiplegia and hemiparesis following cerebral infarction affecting left non-dominant side: Secondary | ICD-10-CM | POA: Diagnosis not present

## 2018-02-03 DIAGNOSIS — D649 Anemia, unspecified: Secondary | ICD-10-CM | POA: Diagnosis not present

## 2018-02-04 DIAGNOSIS — E1151 Type 2 diabetes mellitus with diabetic peripheral angiopathy without gangrene: Secondary | ICD-10-CM | POA: Diagnosis not present

## 2018-02-04 DIAGNOSIS — I10 Essential (primary) hypertension: Secondary | ICD-10-CM | POA: Diagnosis not present

## 2018-02-04 DIAGNOSIS — I69354 Hemiplegia and hemiparesis following cerebral infarction affecting left non-dominant side: Secondary | ICD-10-CM | POA: Diagnosis not present

## 2018-02-04 DIAGNOSIS — J449 Chronic obstructive pulmonary disease, unspecified: Secondary | ICD-10-CM | POA: Diagnosis not present

## 2018-02-04 DIAGNOSIS — Z432 Encounter for attention to ileostomy: Secondary | ICD-10-CM | POA: Diagnosis not present

## 2018-02-04 DIAGNOSIS — J9611 Chronic respiratory failure with hypoxia: Secondary | ICD-10-CM | POA: Diagnosis not present

## 2018-02-04 NOTE — Progress Notes (Signed)
KEERA, ALTIDOR ([id number]) Visit Report for 01/28/2018 Arrival Information Details Patient Name: [name], [name]. Date of Service: 01/28/2018 10:00 AM Medical Record Number: [id number] Patient Account Number: [id number] Date of Birth/Sex: June 18, 1946 (72 y.o. F) Treating RN: Roger Shelter Primary Care Sheretha Shadd: Leanna Battles Other Clinician: Referring Thaddius Manes: Leanna Battles Treating Katrece Roediger/Extender: Tito Dine in Treatment: 4 Visit Information History Since Last Visit All ordered tests and consults were completed: No Patient Arrived: Gilford Rile Added or deleted any medications: No Arrival Time: 09:56 Any new allergies or adverse reactions: No Accompanied By: self Had a fall or experienced change in No Transfer Assistance: None activities of daily living that may affect Patient Identification Verified: Yes risk of falls: Secondary Verification Process Completed: Yes Signs or symptoms of abuse/neglect since last visito No Patient Requires Transmission-Based Precautions: No Hospitalized since last visit: No Patient Has Alerts: Yes Implantable device outside of the clinic excluding No Patient Alerts: DMII cellular tissue based products placed in the center since last visit: Pain Present Now: No Electronic Signature(s) Signed: 01/28/2018 4:34:44 PM By: Roger Shelter Entered By: Roger Shelter on 01/28/2018 09:57:13 [name] ([id number]) -------------------------------------------------------------------------------- Clinic Level of Care Assessment Details Patient Name: Joanna, Strong. Date of Service: 01/28/2018 10:00 AM Medical Record Number: [id number] Patient Account Number: [id number] Date of Birth/Sex: July 22, 1946 (72 y.o. F) Treating RN: Cornell Barman Primary Care Oza Oberle: Leanna Battles Other Clinician: Referring Zayli Villafuerte: Leanna Battles Treating Jager Koska/Extender: Tito Dine in Treatment: 4 Clinic Level of Care Assessment  Items TOOL 4 Quantity Score []  - Use when only an EandM is performed on FOLLOW-UP visit 0 ASSESSMENTS - Nursing Assessment / Reassessment []  - Reassessment of Co-morbidities (includes updates in patient status) 0 X- 1 5 Reassessment of Adherence to Treatment Plan ASSESSMENTS - Wound and Skin Assessment / Reassessment X - Simple Wound Assessment / Reassessment - one wound 1 5 []  - 0 Complex Wound Assessment / Reassessment - multiple wounds []  - 0 Dermatologic / Skin Assessment (not related to wound area) ASSESSMENTS - Focused Assessment []  - Circumferential Edema Measurements - multi extremities 0 []  - 0 Nutritional Assessment / Counseling / Intervention []  - 0 Lower Extremity Assessment (monofilament, tuning fork, pulses) []  - 0 Peripheral Arterial Disease Assessment (using hand held doppler) ASSESSMENTS - Ostomy and/or Continence Assessment and Care []  - Incontinence Assessment and Management 0 []  - 0 Ostomy Care Assessment and Management (repouching, etc.) PROCESS - Coordination of Care X - Simple Patient / Family Education for ongoing care 1 15 []  - 0 Complex (extensive) Patient / Family Education for ongoing care X- 1 10 Staff obtains Programmer, systems, Records, Test Results / Process Orders []  - 0 Staff telephones HHA, Nursing Homes / Clarify orders / etc []  - 0 Routine Transfer to another Facility (non-emergent condition) []  - 0 Routine Hospital Admission (non-emergent condition) []  - 0 New Admissions / Biomedical engineer / Ordering NPWT, Apligraf, etc. []  - 0 Emergency Hospital Admission (emergent condition) X- 1 10 Simple Discharge Coordination LEASIA, SWANN. ([id number]) []  - 0 Complex (extensive) Discharge Coordination PROCESS - Special Needs []  - Pediatric / Minor Patient Management 0 []  - 0 Isolation Patient Management []  - 0 Hearing / Language / Visual special needs []  - 0 Assessment of Community assistance (transportation, D/C planning, etc.) []  -  0 Additional assistance / Altered mentation []  - 0 Support Surface(s) Assessment (bed, cushion, seat, etc.) INTERVENTIONS - Wound Cleansing / Measurement X - Simple Wound Cleansing - one wound 1 5 []  -  0 Complex Wound Cleansing - multiple wounds X- 1 5 Wound Imaging (photographs - any number of wounds) []  - 0 Wound Tracing (instead of photographs) []  - 0 Simple Wound Measurement - one wound []  - 0 Complex Wound Measurement - multiple wounds INTERVENTIONS - Wound Dressings X - Small Wound Dressing one or multiple wounds 1 10 []  - 0 Medium Wound Dressing one or multiple wounds []  - 0 Large Wound Dressing one or multiple wounds []  - 0 Application of Medications - topical []  - 0 Application of Medications - injection INTERVENTIONS - Miscellaneous []  - External ear exam 0 []  - 0 Specimen Collection (cultures, biopsies, blood, body fluids, etc.) []  - 0 Specimen(s) / Culture(s) sent or taken to Lab for analysis []  - 0 Patient Transfer (multiple staff / Civil Service fast streamer / Similar devices) []  - 0 Simple Staple / Suture removal (25 or less) []  - 0 Complex Staple / Suture removal (26 or more) []  - 0 Hypo / Hyperglycemic Management (close monitor of Blood Glucose) []  - 0 Ankle / Brachial Index (ABI) - do not check if billed separately X- 1 5 Vital Signs Clemson, Melyna H. ([id number]) Has the patient been seen at the hospital within the last three years: Yes Total Score: 70 Level Of Care: New/Established - Level 2 Electronic Signature(s) Signed: 01/28/2018 5:11:52 PM By: Gretta Cool, BSN, RN, CWS, Kim RN, BSN Entered By: Gretta Cool, BSN, RN, CWS, Kim on 01/28/2018 10:26:32 [name] ([id number]) -------------------------------------------------------------------------------- Encounter Discharge Information Details Patient Name: [name]. Date of Service: 01/28/2018 10:00 AM Medical Record Number: [id number] Patient Account Number: [id number] Date of Birth/Sex: 04-26-1946 (72 y.o.  F) Treating RN: Roger Shelter Primary Care Alexis Reber: Leanna Battles Other Clinician: Referring Adeoluwa Silvers: Leanna Battles Treating Isaiyah Feldhaus/Extender: Tito Dine in Treatment: 4 Encounter Discharge Information Items Discharge Pain Level: 0 Discharge Condition: Stable Ambulatory Status: Walker Discharge Destination: Home Transportation: Other Accompanied By: driver Schedule Follow-up Appointment: Yes Medication Reconciliation completed and No provided to Patient/Care Wlliam Grosso: Provided on Clinical Summary of Care: 01/28/2018 Form Type Recipient Paper Patient LR Electronic Signature(s) Signed: 01/30/2018 3:53:00 PM By: Ruthine Dose Entered By: Ruthine Dose on 01/28/2018 11:01:33 [name] ([id number]) -------------------------------------------------------------------------------- Lower Extremity Assessment Details Patient Name: [name]. Date of Service: 01/28/2018 10:00 AM Medical Record Number: [id number] Patient Account Number: [id number] Date of Birth/Sex: 1946-07-17 (72 y.o. F) Treating RN: Roger Shelter Primary Care Rishik Tubby: Leanna Battles Other Clinician: Referring Amri Lien: Leanna Battles Treating Tonji Elliff/Extender: Ricard Dillon Weeks in Treatment: 4 Electronic Signature(s) Signed: 01/28/2018 4:34:44 PM By: Roger Shelter Entered By: Roger Shelter on 01/28/2018 10:01:32 [name] ([id number]) -------------------------------------------------------------------------------- Multi Wound Chart Details Patient Name: [name]. Date of Service: 01/28/2018 10:00 AM Medical Record Number: [id number] Patient Account Number: [id number] Date of Birth/Sex: [birth year] (72 y.o. F) Treating RN: Cornell Barman Primary Care Janney Priego: Leanna Battles Other Clinician: Referring Keyshawn Hellwig: Leanna Battles Treating Shaunda Tipping/Extender: Tito Dine in Treatment: 4 Vital Signs Height(in): 63 Pulse(bpm): 86 Weight(lbs):  120 Blood Pressure(mmHg): 133/78 Body Mass Index(BMI): 21 Temperature(F): 97.6 Respiratory Rate 16 (breaths/min): Photos: [1:No Photos] [N/A:N/A] Wound Location: [1:Coccyx] [N/A:N/A] Wounding Event: [1:Pressure Injury] [N/A:N/A] Primary Etiology: [1:Pressure Ulcer] [N/A:N/A] Comorbid History: [1:Cataracts, Hypertension, Type N/A II Diabetes, Osteoarthritis] Date Acquired: [1:10/20/2017] [N/A:N/A] Weeks of Treatment: [1:4] [N/A:N/A] Wound Status: [1:Open] [N/A:N/A] Measurements L x W x D [1:0.9x0.9x0.1] [N/A:N/A] (cm) Area (cm) : [1:0.636] [N/A:N/A] Volume (cm) : [1:0.064] [N/A:N/A] % Reduction in Area: [1:90.60%] [N/A:N/A] % Reduction in Volume: [1:90.60%] [N/A:N/A] Classification: [1:Category/Stage  III] [N/A:N/A] Exudate Amount: [1:Medium] [N/A:N/A] Exudate Type: [1:Serous] [N/A:N/A] Exudate Color: [1:amber] [N/A:N/A] Wound Margin: [1:Flat and Intact] [N/A:N/A] Granulation Amount: [1:Large (67-100%)] [N/A:N/A] Granulation Quality: [1:Pink] [N/A:N/A] Necrotic Amount: [1:Small (1-33%)] [N/A:N/A] Exposed Structures: [1:Fascia: No Fat Layer (Subcutaneous Tissue) Exposed: No Tendon: No Muscle: No Joint: No Bone: No] [N/A:N/A] Epithelialization: [1:Small (1-33%)] [N/A:N/A] Periwound Skin Texture: [1:Excoriation: No Induration: No Callus: No Crepitus: No Rash: No Scarring: No] [N/A:N/A] Periwound Skin Moisture: Maceration: Yes N/A N/A Dry/Scaly: No Periwound Skin Color: Atrophie Blanche: No N/A N/A Cyanosis: No Ecchymosis: No Erythema: No Hemosiderin Staining: No Mottled: No Pallor: No Rubor: No Temperature: No Abnormality N/A N/A Tenderness on Palpation: No N/A N/A Wound Preparation: Ulcer Cleansing: N/A N/A Rinsed/Irrigated with Saline Topical Anesthetic Applied: Other: hurricaine spray Treatment Notes Wound #1 (Coccyx) 1. Cleansed with: Clean wound with Normal Saline 2. Anesthetic Hurricaine Topical Anesthetic Spray 3. Peri-wound Care: Skin Prep 4.  Dressing Applied: Other dressing (specify in notes) 5. Secondary Dressing Applied Bordered Foam Dressing Notes silvercel Electronic Signature(s) Signed: 01/28/2018 5:09:03 PM By: Linton Ham MD Entered By: Linton Ham on 01/28/2018 10:59:11 [name] ([id number]) -------------------------------------------------------------------------------- [place] Details Patient Name: [name] Date of Service: 01/28/2018 10:00 AM Medical Record Number: [id number] Patient Account Number: [id number] Date of Birth/Sex: 07-21-1946 (72 y.o. F) Treating RN: Cornell Barman Primary Care Monika Chestang: Leanna Battles Other Clinician: Referring Leighla Chestnutt: Leanna Battles Treating Evangelos Paulino/Extender: Tito Dine in Treatment: 4 Active Inactive ` Abuse / Safety / Falls / Self Care Management Nursing Diagnoses: Impaired physical mobility Goals: Patient will not experience any injury related to falls Date Initiated: 12/31/2017 Target Resolution Date: 03/21/2018 Goal Status: Active Interventions: Assess fall risk on admission and as needed Notes: ` Orientation to the Wound Care Program Nursing Diagnoses: Knowledge deficit related to the wound healing center program Goals: Patient/caregiver will verbalize understanding of the [place] Date Initiated: 12/31/2017 Target Resolution Date: 03/21/2018 Goal Status: Active Interventions: Provide education on orientation to the wound center Notes: ` Wound/Skin Impairment Nursing Diagnoses: Impaired tissue integrity Goals: Ulcer/skin breakdown will heal within 14 weeks Date Initiated: 12/31/2017 Target Resolution Date: 01/17/2018 Goal Status: Active Interventions: RANI, IDLER ([id number]) Assess patient/caregiver ability to obtain necessary supplies Assess patient/caregiver ability to perform ulcer/skin care regimen upon admission and as needed Assess ulceration(s) every  visit Notes: Electronic Signature(s) Signed: 01/28/2018 5:11:52 PM By: Gretta Cool, BSN, RN, CWS, Kim RN, BSN Entered By: Gretta Cool, BSN, RN, CWS, Kim on 01/28/2018 10:24:23 [name] ([id number]) -------------------------------------------------------------------------------- Pain Assessment Details Patient Name: [name]. Date of Service: 01/28/2018 10:00 AM Medical Record Number: [id number] Patient Account Number: [id number] Date of Birth/Sex: 1946/06/20 (72 y.o. F) Treating RN: Roger Shelter Primary Care Aivy Akter: Leanna Battles Other Clinician: Referring Masae Lukacs: Leanna Battles Treating Julie-Anne Torain/Extender: Tito Dine in Treatment: 4 Active Problems Location of Pain Severity and Description of Pain Patient Has Paino No Site Locations Pain Management and Medication Current Pain Management: Electronic Signature(s) Signed: 01/28/2018 4:34:44 PM By: Roger Shelter Entered By: Roger Shelter on 01/28/2018 09:57:21 [name] ([id number]) -------------------------------------------------------------------------------- Patient/Caregiver Education Details Patient Name: [name] Date of Service: 01/28/2018 10:00 AM Medical Record Number: [id number] Patient Account Number: [id number] Date of Birth/Gender: 11/19/1945 (72 y.o. F) Treating RN: Roger Shelter Primary Care Physician: Leanna Battles Other Clinician: Referring Physician: Leanna Battles Treating Physician/Extender: Tito Dine in Treatment: 4 Education Assessment Education Provided To: Patient Education Topics Provided Wound Debridement: Handouts: Wound Debridement Methods: Explain/Verbal Responses: State content  correctly Wound/Skin Impairment: Handouts: Caring for Your Ulcer Methods: Explain/Verbal Responses: State content correctly Electronic Signature(s) Signed: 01/28/2018 4:34:44 PM By: Roger Shelter Entered By: Roger Shelter on 01/28/2018  10:38:39 [name] ([id number]) -------------------------------------------------------------------------------- Wound Assessment Details Patient Name: [name]. Date of Service: 01/28/2018 10:00 AM Medical Record Number: [id number] Patient Account Number: [id number] Date of Birth/Sex: 1946-01-04 (72 y.o. F) Treating RN: Roger Shelter Primary Care Marybell Robards: Leanna Battles Other Clinician: Referring Coty Student: Leanna Battles Treating Jandi Swiger/Extender: Tito Dine in Treatment: 4 Wound Status Wound Number: 1 Primary Pressure Ulcer Etiology: Wound Location: Coccyx Wound Status: Open Wounding Event: Pressure Injury Comorbid Cataracts, Hypertension, Type II Diabetes, Date Acquired: 10/20/2017 History: Osteoarthritis Weeks Of Treatment: 4 Clustered Wound: No Photos Photo Uploaded By: Roger Shelter on 01/28/2018 11:43:14 Wound Measurements Length: (cm) 0.9 Width: (cm) 0.9 Depth: (cm) 0.1 Area: (cm) 0.636 Volume: (cm) 0.064 % Reduction in Area: 90.6% % Reduction in Volume: 90.6% Epithelialization: Small (1-33%) Tunneling: No Undermining: No Wound Description Classification: Category/Stage III Wound Margin: Flat and Intact Exudate Amount: Medium Exudate Type: Serous Exudate Color: amber Foul Odor After Cleansing: No Slough/Fibrino Yes Wound Bed Granulation Amount: Large (67-100%) Exposed Structure Granulation Quality: Pink Fascia Exposed: No Necrotic Amount: Small (1-33%) Fat Layer (Subcutaneous Tissue) Exposed: No Necrotic Quality: Adherent Slough Tendon Exposed: No Muscle Exposed: No Joint Exposed: No Bone Exposed: No Periwound Skin Texture Texture Color No Abnormalities Noted: No No Abnormalities Noted: No Callus: No Atrophie Blanche: No Everson, Sharmel H. ([id number]) Crepitus: No Cyanosis: No Excoriation: No Ecchymosis: No Induration: No Erythema: No Rash: No Hemosiderin Staining: No Scarring: No Mottled: No Pallor:  No Moisture Rubor: No No Abnormalities Noted: No Dry / Scaly: No Temperature / Pain Maceration: Yes Temperature: No Abnormality Wound Preparation Ulcer Cleansing: Rinsed/Irrigated with Saline Topical Anesthetic Applied: Other: hurricaine spray, Treatment Notes Wound #1 (Coccyx) 1. Cleansed with: Clean wound with Normal Saline 2. Anesthetic Hurricaine Topical Anesthetic Spray 3. Peri-wound Care: Skin Prep 4. Dressing Applied: Other dressing (specify in notes) 5. Secondary Dressing Applied Bordered Foam Dressing Notes silvercel Electronic Signature(s) Signed: 01/28/2018 4:34:44 PM By: Roger Shelter Entered By: Roger Shelter on 01/28/2018 10:01:21 [name] ([id number]) -------------------------------------------------------------------------------- Vitals Details Patient Name: [name] Date of Service: 01/28/2018 10:00 AM Medical Record Number: [id number] Patient Account Number: [id number] Date of Birth/Sex: 1946-06-29 (72 y.o. F) Treating RN: Roger Shelter Primary Care Keishawna Carranza: Leanna Battles Other Clinician: Referring Jorje Vanatta: Leanna Battles Treating Shawnell Dykes/Extender: Tito Dine in Treatment: 4 Vital Signs Time Taken: 09:57 Temperature (F): 97.6 Height (in): 63 Pulse (bpm): 86 Weight (lbs): 120 Respiratory Rate (breaths/min): 16 Body Mass Index (BMI): 21.3 Blood Pressure (mmHg): 133/78 Reference Range: 80 - 120 mg / dl Electronic Signature(s) Signed: 01/28/2018 4:34:44 PM By: Roger Shelter Entered By: Roger Shelter on 01/28/2018 09:57:43

## 2018-02-05 DIAGNOSIS — E1151 Type 2 diabetes mellitus with diabetic peripheral angiopathy without gangrene: Secondary | ICD-10-CM | POA: Diagnosis not present

## 2018-02-05 DIAGNOSIS — Z432 Encounter for attention to ileostomy: Secondary | ICD-10-CM | POA: Diagnosis not present

## 2018-02-05 DIAGNOSIS — J449 Chronic obstructive pulmonary disease, unspecified: Secondary | ICD-10-CM | POA: Diagnosis not present

## 2018-02-05 DIAGNOSIS — J9611 Chronic respiratory failure with hypoxia: Secondary | ICD-10-CM | POA: Diagnosis not present

## 2018-02-05 DIAGNOSIS — I10 Essential (primary) hypertension: Secondary | ICD-10-CM | POA: Diagnosis not present

## 2018-02-05 DIAGNOSIS — I69354 Hemiplegia and hemiparesis following cerebral infarction affecting left non-dominant side: Secondary | ICD-10-CM | POA: Diagnosis not present

## 2018-02-09 DIAGNOSIS — I69354 Hemiplegia and hemiparesis following cerebral infarction affecting left non-dominant side: Secondary | ICD-10-CM | POA: Diagnosis not present

## 2018-02-09 DIAGNOSIS — E1151 Type 2 diabetes mellitus with diabetic peripheral angiopathy without gangrene: Secondary | ICD-10-CM | POA: Diagnosis not present

## 2018-02-09 DIAGNOSIS — I10 Essential (primary) hypertension: Secondary | ICD-10-CM | POA: Diagnosis not present

## 2018-02-09 DIAGNOSIS — J9611 Chronic respiratory failure with hypoxia: Secondary | ICD-10-CM | POA: Diagnosis not present

## 2018-02-09 DIAGNOSIS — J449 Chronic obstructive pulmonary disease, unspecified: Secondary | ICD-10-CM | POA: Diagnosis not present

## 2018-02-09 DIAGNOSIS — Z432 Encounter for attention to ileostomy: Secondary | ICD-10-CM | POA: Diagnosis not present

## 2018-02-11 ENCOUNTER — Encounter: Payer: Medicare Other | Attending: Internal Medicine | Admitting: Internal Medicine

## 2018-02-11 ENCOUNTER — Emergency Department (HOSPITAL_COMMUNITY)
Admission: EM | Admit: 2018-02-11 | Discharge: 2018-02-11 | Disposition: A | Discharge status: Home / Self Care | Payer: Medicare Other | Attending: Emergency Medicine | Admitting: Emergency Medicine

## 2018-02-11 ENCOUNTER — Encounter (HOSPITAL_COMMUNITY): Payer: Self-pay | Admitting: *Deleted

## 2018-02-11 DIAGNOSIS — Z8673 Personal history of transient ischemic attack (TIA), and cerebral infarction without residual deficits: Secondary | ICD-10-CM | POA: Diagnosis not present

## 2018-02-11 DIAGNOSIS — R54 Age-related physical debility: Secondary | ICD-10-CM | POA: Insufficient documentation

## 2018-02-11 DIAGNOSIS — L89153 Pressure ulcer of sacral region, stage 3: Secondary | ICD-10-CM | POA: Diagnosis not present

## 2018-02-11 DIAGNOSIS — N179 Acute kidney failure, unspecified: Secondary | ICD-10-CM | POA: Diagnosis not present

## 2018-02-11 DIAGNOSIS — Z932 Ileostomy status: Secondary | ICD-10-CM | POA: Insufficient documentation

## 2018-02-11 DIAGNOSIS — E860 Dehydration: Secondary | ICD-10-CM | POA: Diagnosis not present

## 2018-02-11 DIAGNOSIS — E119 Type 2 diabetes mellitus without complications: Secondary | ICD-10-CM | POA: Diagnosis not present

## 2018-02-11 DIAGNOSIS — Z79899 Other long term (current) drug therapy: Secondary | ICD-10-CM | POA: Insufficient documentation

## 2018-02-11 DIAGNOSIS — F17210 Nicotine dependence, cigarettes, uncomplicated: Secondary | ICD-10-CM | POA: Diagnosis not present

## 2018-02-11 DIAGNOSIS — J449 Chronic obstructive pulmonary disease, unspecified: Secondary | ICD-10-CM | POA: Diagnosis not present

## 2018-02-11 DIAGNOSIS — I10 Essential (primary) hypertension: Secondary | ICD-10-CM | POA: Insufficient documentation

## 2018-02-11 DIAGNOSIS — L89152 Pressure ulcer of sacral region, stage 2: Secondary | ICD-10-CM | POA: Diagnosis not present

## 2018-02-11 DIAGNOSIS — I2510 Atherosclerotic heart disease of native coronary artery without angina pectoris: Secondary | ICD-10-CM | POA: Diagnosis not present

## 2018-02-11 DIAGNOSIS — Z432 Encounter for attention to ileostomy: Secondary | ICD-10-CM

## 2018-02-11 LAB — COMPREHENSIVE METABOLIC PANEL
ALBUMIN: 3.8 g/dL (ref 3.5–5.0)
ALK PHOS: 188 U/L — AB (ref 38–126)
ALT: 48 U/L (ref 14–54)
ANION GAP: 11 (ref 5–15)
AST: 54 U/L — AB (ref 15–41)
BILIRUBIN TOTAL: 0.5 mg/dL (ref 0.3–1.2)
BUN: 28 mg/dL — AB (ref 6–20)
CO2: 23 mmol/L (ref 22–32)
Calcium: 9.3 mg/dL (ref 8.9–10.3)
Chloride: 98 mmol/L — ABNORMAL LOW (ref 101–111)
Creatinine, Ser: 1.8 mg/dL — ABNORMAL HIGH (ref 0.44–1.00)
GFR calc Af Amer: 32 mL/min — ABNORMAL LOW (ref >60)
GFR calc non Af Amer: 27 mL/min — ABNORMAL LOW (ref >60)
GLUCOSE: 103 mg/dL — AB (ref 65–99)
Potassium: 4.1 mmol/L (ref 3.5–5.1)
SODIUM: 132 mmol/L — AB (ref 135–145)
TOTAL PROTEIN: 8 g/dL (ref 6.5–8.1)

## 2018-02-11 LAB — CBC
HCT: 35.4 % — ABNORMAL LOW (ref 36.0–46.0)
HEMOGLOBIN: 11.8 g/dL — AB (ref 12.0–15.0)
MCH: 27.3 pg (ref 26.0–34.0)
MCHC: 33.3 g/dL (ref 30.0–36.0)
MCV: 81.9 fL (ref 78.0–100.0)
Platelets: 363 10*3/uL (ref 150–400)
RBC: 4.32 MIL/uL (ref 3.87–5.11)
RDW: 14.9 % (ref 11.5–15.5)
WBC: 13.7 10*3/uL — ABNORMAL HIGH (ref 4.0–10.5)

## 2018-02-11 LAB — URINALYSIS, ROUTINE W REFLEX MICROSCOPIC
Bilirubin Urine: NEGATIVE
Glucose, UA: NEGATIVE mg/dL
Hgb urine dipstick: NEGATIVE
Ketones, ur: NEGATIVE mg/dL
NITRITE: NEGATIVE
PH: 6 (ref 5.0–8.0)
Protein, ur: NEGATIVE mg/dL
SPECIFIC GRAVITY, URINE: 1.003 — AB (ref 1.005–1.030)

## 2018-02-11 LAB — LIPASE, BLOOD: Lipase: 50 U/L (ref 11–51)

## 2018-02-11 MED ORDER — LACTATED RINGERS IV BOLUS
1000.0000 mL | Freq: Once | INTRAVENOUS | Status: AC
  Administered 2018-02-11: 1000 mL via INTRAVENOUS

## 2018-02-11 MED ORDER — FENTANYL CITRATE (PF) 100 MCG/2ML IJ SOLN
50.0000 ug | Freq: Once | INTRAMUSCULAR | Status: AC
  Administered 2018-02-11: 50 ug via INTRAVENOUS
  Filled 2018-02-11: qty 2

## 2018-02-11 NOTE — ED Notes (Signed)
[facility]ICK PUT IN PLACE TO COLLECT URINE SAMPLE

## 2018-02-11 NOTE — Care Management Note (Signed)
Case Management Note  Patient Details  Name: BLAYKLEE MABLE MRN: [id number] Date of Birth: 08-15-1946  CM consulted for increase in [facility] services by Dr. Dayna Barker.  Pt reports she has [place] RN coming to her home now but only comes a few times a week.  Pt was unable to remember the name of the [facility] agency.  When hearing a list of [place] agency names, pt believes that she is being seen by [facility].  CM contacted Dorian Pod with [facility] and confirmed pt it active with their services.  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  No further CM needs noted at this time.  Expected Discharge Date:   02/11/2018               Expected Discharge Plan:  [place]  Discharge planning Services  CM Consult  Post Acute Care Choice:  Home Health Choice offered to:  Patient  HH Arranged:  RN, Nurse's Aide, Social Work [facility] Agency:  [facility]  Status of Service:  Completed, signed off  Rae Mar, RN 02/11/2018, 5:29 PM

## 2018-02-11 NOTE — ED Triage Notes (Signed)
Pt complains of abominable pain and drainage from her around ileostomy bag. Pt was sent from the wound center.

## 2018-02-11 NOTE — ED Provider Notes (Signed)
Emergency Department Provider Note   I have reviewed the triage vital signs and the nursing notes.   HISTORY  Chief Complaint Abdominal Pain and ileostomy problem   HPI [name] is a 72 y.o. female with multiple medical problems as documented below qwho presents with abdominal pain and wound. She has an ileostomy that has had difficulty with leaking for awhile, but the last 3-5 days her skin has become significantly excoriated, painful and denuded. She has not felt a fever or chills. No systemic symptoms. Had seen wound ostomy nurse who could not fix it. Went to wound care center today where they sent her here with concerns for cellulitis vs need for a drain of some sort rather than the ileostomy. No other symptoms.   No other associated or modifying symptoms.    Past Medical History:  Diagnosis Date  . Anxiety   . Arthropathy, unspecified, site unspecified   . Blind loop syndrome   . Carotid artery disease ([place])    documented occlusion of left ICA  . Coronary artery disease    a. known 60-70% mid-LAD stenosis with caths in 1999, 2002, 2006, and 2012 showing stable anatomy b. low-risk NST in 10/2015  . Depression   . Diabetes insipidus ([place])   . Diabetes mellitus   . Diverticulosis   . Dizziness    chronic  . Dyslipidemia   . Dyspnea    with exertion  . Fatty liver   . GERD (gastroesophageal reflux disease)    hiatial hernia  . Hepatomegaly   . Hiatal hernia   . HTN (hypertension)   . Hyperlipidemia   . Hypertension   . Irritable bowel syndrome   . Melanosis coli   . Metatarsal fracture right  . Normal nuclear stress test    nml 01/03/10;06/19/07  . Other, mixed, or unspecified nondependent drug abuse, unspecified   . Pancreatitis, chronic ([place])   . Peripheral vascular disease ([place]) 02/20/10   s/p stent of left SFA  . Sleep apnea   . Stroke ([place])   . Thyroid nodule   . Unspecified essential hypertension   . Vitamin B12 deficiency     Patient Active  Problem List   Diagnosis Date Noted  . Ileostomy in place [facility]) 10/23/2017  . Pressure injury of skin 10/19/2017  . 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  Ileostomy 10/18/2017 10/14/2017  . Ischemic colitis ([place]) 10/14/2017  . Acute respiratory failure with hypoxemia ([place]) 10/14/2017  . Acute post-operative pain 10/14/2017  . AKI (acute kidney injury) ([place]) 10/14/2017  . Anemia 10/14/2017  . Nocturia more than twice per night 05/07/2017  . Diabetes due to underlying condition w oth circulatory comp ([place]) 05/07/2017  . Atherosclerotic PVD with intermittent claudication ([place]) 05/07/2017  . COPD (chronic obstructive pulmonary disease) ([place]) 05/07/2017  . Poor sleep hygiene 05/07/2017  . 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 Hilar lymphadenopathy   . Hemoptysis 02/19/2017  . Pulmonary nodules/lesions, multiple 02/19/2017  . Tobacco use 02/19/2017  . RUQ abdominal pain 01/13/2017  . Pancreatic insufficiency 01/13/2017  . Left-sided extracranial carotid artery occlusion 11/26/2016  . PAOD (peripheral arterial occlusive disease) ([place]) 11/26/2016  . Encounter for routine follow-up after surgery of the circulatory system 11/26/2016  . Chest pain with moderate risk of acute coronary syndrome 10/27/2015  . Type 2 diabetes mellitus ([place]) 10/27/2015  . Occlusion and stenosis of carotid artery without mention of cerebral infarction 04/14/2012  . Chronic pancreatitis ([place]) 04/30/2011  . Vitamin B12 deficiency  04/30/2011  . 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 Fatty liver 04/30/2011  . Obesity 04/30/2011  . Abdominal pain 04/09/2011  . Iron deficiency anemia, unspecified  04/09/2011  . Other general symptoms  04/09/2011  . DM (diabetes mellitus screen) 04/09/2011  . Asthma 04/09/2011  . Coronary artery disease   . Dyslipidemia   . Dizziness   . Right-sided extracranial carotid artery stenosis   . Depression   . Peripheral vascular disease ([place])   . Normal nuclear  stress test   . B12 DEFICIENCY 03/19/2010  . DVT 03/16/2010  . BLIND LOOP SYNDROME 03/16/2010  . DIARRHEA 03/16/2010  . LAXATIVE ABUSE 12/17/2007  . Essential hypertension 12/17/2007  . GERD 12/17/2007  . HIATAL HERNIA 12/17/2007  . IBS 12/17/2007  . MELANOSIS COLI 12/17/2007  . ARTHRITIS 12/17/2007  . HEPATOMEGALY 12/17/2007    Past Surgical History:  Procedure Laterality Date  . ABDOMINAL HYSTERECTOMY     partial  . AUGMENTATION MAMMAPLASTY     bilateral 1976 retro   . BREAST ENHANCEMENT SURGERY    . CARDIAC CATHETERIZATION  07/07/98;08/31/01;03/04/05   60 -70% LAD  . CATARACT EXTRACTION    . COLON RESECTION N/A 10/14/2017   Procedure: EXPLORATORY LAPAROTOMY, EXTENDED RIGHT COLECTOMY; APPLICATION OF ABDOMINAL VACUUM DRESSING;  Surgeon: Jovita Kussmaul, MD;  Location: [facility];  Service: General;  Laterality: N/A;  . ENDOBRONCHIAL ULTRASOUND Bilateral 02/24/2017   Procedure: ENDOBRONCHIAL ULTRASOUND;  Surgeon: Collene Gobble, MD;  Location: [facility];  Service: Cardiopulmonary;  Laterality: Bilateral;  . EYE SURGERY Left Nov. 2016   Cataract  . EYE SURGERY Right 2001   Cataract  . FEMORAL ARTERY STENT     Left leg  . LAPAROTOMY N/A 10/16/2017   Procedure: EXPLORATORY LAPAROTOMY, PARTIAL OMENTECTOMY, RESECTION ISCHEMIC ILEUM 250NL, APPLICATION OF VAC ABDOMINAL DRESSING;  Surgeon: Armandina Gemma, MD;  Location: [facility];  Service: General;  Laterality: N/A;  . LAPAROTOMY N/A 10/18/2017   Procedure: RE-EXPLORATION OF ABDOMEN, ILEOSTOMY CREATION;  Surgeon: Stark Klein, MD;  Location: [facility];  Service: General;  Laterality: N/A;  . LUNG SURGERY  03/2017   Benign polyps removed  . PATELLA REALIGNMENT Left   . TONSILLECTOMY    . TOTAL ABDOMINAL HYSTERECTOMY    . WRIST SURGERY     right    Current Outpatient Rx  . Order #: [id number] Class: Normal  . Order #: [id number] Class: Normal  . Order #: [id number] Class: Normal  . Order #: [id number] Class: Normal  . Order #: [id number] Class:  Historical Med  . Order #: [id number] Class: Normal  . Order #: [id number] Class: Print    Allergies Codeine and Iodinated diagnostic agents  Family History  Problem Relation Age of Onset  . Hypertension Mother   . Osteoarthritis Mother   . Pulmonary embolism Mother   . Hypertension Father        aneurysm  . Stroke Father   . Stroke Unknown   . Colon cancer Paternal Grandfather     Social History Social History   Tobacco Use  . Smoking status: Current Some Day Smoker    Packs/day: 1.00    Years: 50.00    Pack years: 50.00    Types: Cigarettes    Last attempt to quit: 10/14/2010    Years since quitting: 7.3  . Smokeless tobacco: Never Used  Substance Use Topics  . Alcohol use: No  . Drug use: No    Review of Systems  All other systems negative except as documented in the HPI. All pertinent positives  and negatives as reviewed in the HPI. ____________________________________________   PHYSICAL EXAM:  VITAL SIGNS: ED Triage Vitals  Enc Vitals Group     BP 02/11/18 1256 (!) 171/74     Pulse Rate 02/11/18 1256 (!) 105     Resp 02/11/18 1447 16     Temp 02/11/18 1256 97.7 F (36.5 C)     Temp Source 02/11/18 1256 Oral     SpO2 02/11/18 1256 100 %     Weight --      Height --      Head Circumference --      Peak Flow --      Pain Score 02/11/18 1254 8     Pain Loc --      Pain Edu? --      Excl. in [place]? --     Constitutional: Alert and oriented. Well appearing and in no acute distress. Eyes: Conjunctivae are normal. PERRL. EOMI. Head: Atraumatic. Nose: No congestion/rhinnorhea. Mouth/Throat: Mucous membranes are moist.  Oropharynx non-erythematous. Neck: No stridor.  No meningeal signs.   Cardiovascular: tachycardic rate, regular rhythm. Good peripheral circulation. Grossly normal heart sounds.   Respiratory: Normal respiratory effort.  No retractions. Lungs CTAB. Gastrointestinal: Soft and nontender. No distention.  Musculoskeletal: No lower extremity  tenderness nor edema. No gross deformities of extremities. Neurologic:  Normal speech and language. No gross focal neurologic deficits are appreciated.  Skin:  Significant fecal leakage from ostomy with approximately 15 cm of denuded, warm, erythematous skin surrounding it. Abdomen itself benign but this area obviously ttp.    ____________________________________________   LABS (all labs ordered are listed, but only abnormal results are displayed)  Labs Reviewed  COMPREHENSIVE METABOLIC PANEL - Abnormal; Notable for the following components:      Result Value   Sodium 132 (*)    Chloride 98 (*)    Glucose, Bld 103 (*)    BUN 28 (*)    Creatinine, Ser 1.80 (*)    AST 54 (*)    Alkaline Phosphatase 188 (*)    GFR calc non Af Amer 27 (*)    GFR calc Af Amer 32 (*)    All other components within normal limits  CBC - Abnormal; Notable for the following components:   WBC 13.7 (*)    Hemoglobin 11.8 (*)    HCT 35.4 (*)    All other components within normal limits  URINALYSIS, ROUTINE W REFLEX MICROSCOPIC - Abnormal; Notable for the following components:   Color, Urine STRAW (*)    Specific Gravity, Urine 1.003 (*)    Leukocytes, UA SMALL (*)    Bacteria, UA MANY (*)    All other components within normal limits  LIPASE, BLOOD   ____________________________________________  RADIOLOGY  No results found.  ____________________________________________   PROCEDURES  Procedure(s) performed:   Procedures   ____________________________________________   INITIAL IMPRESSION / ASSESSMENT AND PLAN / ED COURSE  Likely slightly dehydrated from high output from ileostomy - lr bolus  Wound around ostomy appears c/w likely desquamation and irritation from being wet, warm, bacteria.  - consult ostomy nurse and consider antibiotics based on recommendations - discuss with whoever placed it about alternatives?  Able to get the ostomy sealed here. Ostomy  Nurse put in recommendations  for home health daily and wound care daily. HR improved some with fluids. Will need to fu w/ pcp for recheck of kidney function. Shared decision making and patient prefers discharge which I think is a safe option with  home health and pcp follow up.     Pertinent labs & imaging results that were available during my care of the patient were reviewed by me and considered in my medical decision making (see chart for details).  ____________________________________________  FINAL CLINICAL IMPRESSION(S) / ED DIAGNOSES  Final diagnoses:  Ileostomy care ([place])  Dehydration  AKI (acute kidney injury) ([place])     MEDICATIONS GIVEN DURING THIS VISIT:  Medications  lactated ringers bolus 1,000 mL (0 mLs Intravenous Stopped 02/11/18 1835)  fentaNYL (SUBLIMAZE) injection 50 mcg (50 mcg Intravenous Given 02/11/18 1524)  lactated ringers bolus 1,000 mL (0 mLs Intravenous Stopped 02/11/18 1941)     NEW OUTPATIENT MEDICATIONS STARTED DURING THIS VISIT:  New Prescriptions   No medications on file    Note:  This note was prepared with assistance of Dragon voice recognition software. Occasional wrong-word or sound-a-like substitutions may have occurred due to the inherent limitations of voice recognition software.   Merrily Pew, MD 02/11/18 2012

## 2018-02-11 NOTE — Consult Note (Addendum)
[facility] Nurse [place] Nurse ostomy consult note Pt is familiar to ostomy team from previous admissions.  She had a leaking pouch for several days and skin to abd is red, macerated, with partial thickness skin loss related to ileostomy effluent. [place] nurse had just applied pouch prior to consult, so stoma was not assessed; it is flush with skin level, according to the EMR. Peristomal assessment: Entire left side of abd is painful, erythemia and weeping. It will be difficult to obtain a pouch seal until this resolves.  Applied stoma powder and belt to assist with holding pouch in place until skin is healed.   Treatment options for stomal/peristomal skin: Barrier ring and one piece flexible convex pouch applied Output: Mod amt semiformed yellow stool at this time, but patient states it was previously liquid when skin breakdown occurred.  Education provided:  Reviewed troubleshooting, convex pouching systems to use, crusting to protect skin.  Recommend home health assistance daily for pouching assistance until the skin improves, since it will be difficult to keep a pouch in place. Please re-consult if further assistance is needed.  Thank-you,  Julien Girt MSN, [place], [place], [place], [place]

## 2018-02-11 NOTE — ED Notes (Addendum)
CLEANED ILLSOSTIOMY AND CHANGED BAG

## 2018-02-11 NOTE — ED Notes (Signed)
CLEANED AREA A SECOND TIME REATTACHED OSTOMY BAG

## 2018-02-12 DIAGNOSIS — J439 Emphysema, unspecified: Secondary | ICD-10-CM | POA: Diagnosis not present

## 2018-02-12 DIAGNOSIS — E860 Dehydration: Secondary | ICD-10-CM | POA: Insufficient documentation

## 2018-02-12 DIAGNOSIS — M545 Low back pain: Secondary | ICD-10-CM | POA: Diagnosis not present

## 2018-02-12 DIAGNOSIS — I2510 Atherosclerotic heart disease of native coronary artery without angina pectoris: Secondary | ICD-10-CM | POA: Diagnosis not present

## 2018-02-12 DIAGNOSIS — Z1389 Encounter for screening for other disorder: Secondary | ICD-10-CM | POA: Diagnosis not present

## 2018-02-12 DIAGNOSIS — L309 Dermatitis, unspecified: Secondary | ICD-10-CM | POA: Insufficient documentation

## 2018-02-12 DIAGNOSIS — K8681 Exocrine pancreatic insufficiency: Secondary | ICD-10-CM | POA: Diagnosis not present

## 2018-02-12 DIAGNOSIS — Z932 Ileostomy status: Secondary | ICD-10-CM | POA: Diagnosis not present

## 2018-02-12 DIAGNOSIS — E1151 Type 2 diabetes mellitus with diabetic peripheral angiopathy without gangrene: Secondary | ICD-10-CM | POA: Diagnosis not present

## 2018-02-12 DIAGNOSIS — Z6826 Body mass index (BMI) 26.0-26.9, adult: Secondary | ICD-10-CM | POA: Diagnosis not present

## 2018-02-12 DIAGNOSIS — L308 Other specified dermatitis: Secondary | ICD-10-CM | POA: Diagnosis not present

## 2018-02-13 DIAGNOSIS — J9611 Chronic respiratory failure with hypoxia: Secondary | ICD-10-CM | POA: Diagnosis not present

## 2018-02-13 DIAGNOSIS — E1151 Type 2 diabetes mellitus with diabetic peripheral angiopathy without gangrene: Secondary | ICD-10-CM | POA: Diagnosis not present

## 2018-02-13 DIAGNOSIS — J449 Chronic obstructive pulmonary disease, unspecified: Secondary | ICD-10-CM | POA: Diagnosis not present

## 2018-02-13 DIAGNOSIS — I10 Essential (primary) hypertension: Secondary | ICD-10-CM | POA: Diagnosis not present

## 2018-02-13 DIAGNOSIS — Z432 Encounter for attention to ileostomy: Secondary | ICD-10-CM | POA: Diagnosis not present

## 2018-02-13 DIAGNOSIS — I69354 Hemiplegia and hemiparesis following cerebral infarction affecting left non-dominant side: Secondary | ICD-10-CM | POA: Diagnosis not present

## 2018-02-14 NOTE — Progress Notes (Signed)
[name], [name] ([id number]) Visit Report for 02/11/2018 Arrival Information Details Patient Name: [name], [name]. Date of Service: 02/11/2018 9:30 AM Medical Record Number: [id number] Patient Account Number: [id number] Date of Birth/Sex: May 02, 1946 (72 y.o. F) Treating RN: Roger Shelter Primary Care Cathlene Gardella: Leanna Battles Other Clinician: Referring Kayvon Mo: Leanna Battles Treating Samie Reasons/Extender: Tito Dine in Treatment: 6 Visit Information History Since Last Visit All ordered tests and consults were completed: No Patient Arrived: Ambulatory Added or deleted any medications: No Arrival Time: 10:17 Any new allergies or adverse reactions: No Accompanied By: self Had a fall or experienced change in No Transfer Assistance: None activities of daily living that may affect Patient Identification Verified: Yes risk of falls: Secondary Verification Process Completed: Yes Signs or symptoms of abuse/neglect since last visito No Patient Requires Transmission-Based No Hospitalized since last visit: No Precautions: Implantable device outside of the clinic excluding No Patient Has Alerts: Yes cellular tissue based products placed in the center Patient Alerts: DMII since last visit: Pain Present Now: No Electronic Signature(s) Signed: 02/11/2018 4:21:17 PM By: Roger Shelter Entered By: Roger Shelter on 02/11/2018 10:19:17 [name] ([id number]) -------------------------------------------------------------------------------- Clinic Level of Care Assessment Details Patient Name: [name]. Date of Service: 02/11/2018 9:30 AM Medical Record Number: [id number] Patient Account Number: [id number] Date of Birth/Sex: 1946/04/29 (72 y.o. F) Treating RN: Montey Hora Primary Care Zenya Hickam: Leanna Battles Other Clinician: Referring Jasyn Mey: Leanna Battles Treating Kaylub Detienne/Extender: Tito Dine in Treatment: 6 Clinic Level of Care Assessment  Items TOOL 4 Quantity Score []  - Use when only an EandM is performed on FOLLOW-UP visit 0 ASSESSMENTS - Nursing Assessment / Reassessment X - Reassessment of Co-morbidities (includes updates in patient status) 1 10 X- 1 5 Reassessment of Adherence to Treatment Plan ASSESSMENTS - Wound and Skin Assessment / Reassessment X - Simple Wound Assessment / Reassessment - one wound 1 5 []  - 0 Complex Wound Assessment / Reassessment - multiple wounds []  - 0 Dermatologic / Skin Assessment (not related to wound area) ASSESSMENTS - Focused Assessment []  - Circumferential Edema Measurements - multi extremities 0 []  - 0 Nutritional Assessment / Counseling / Intervention []  - 0 Lower Extremity Assessment (monofilament, tuning fork, pulses) []  - 0 Peripheral Arterial Disease Assessment (using hand held doppler) ASSESSMENTS - Ostomy and/or Continence Assessment and Care []  - Incontinence Assessment and Management 0 X- 1 20 Ostomy Care Assessment and Management (repouching, etc.) PROCESS - Coordination of Care X - Simple Patient / Family Education for ongoing care 1 15 []  - 0 Complex (extensive) Patient / Family Education for ongoing care []  - 0 Staff obtains Programmer, systems, Records, Test Results / Process Orders []  - 0 Staff telephones HHA, Nursing Homes / Clarify orders / etc []  - 0 Routine Transfer to another Facility (non-emergent condition) []  - 0 Routine Hospital Admission (non-emergent condition) []  - 0 New Admissions / Biomedical engineer / Ordering NPWT, Apligraf, etc. []  - 0 Emergency Hospital Admission (emergent condition) X- 1 10 Simple Discharge Coordination [name], [name]. ([id number]) []  - 0 Complex (extensive) Discharge Coordination PROCESS - Special Needs []  - Pediatric / Minor Patient Management 0 []  - 0 Isolation Patient Management []  - 0 Hearing / Language / Visual special needs []  - 0 Assessment of Community assistance (transportation, D/C planning, etc.) []  -  0 Additional assistance / Altered mentation []  - 0 Support Surface(s) Assessment (bed, cushion, seat, etc.) INTERVENTIONS - Wound Cleansing / Measurement X - Simple Wound Cleansing - one wound 1 5 []  -  0 Complex Wound Cleansing - multiple wounds X- 1 5 Wound Imaging (photographs - any number of wounds) []  - 0 Wound Tracing (instead of photographs) X- 1 5 Simple Wound Measurement - one wound []  - 0 Complex Wound Measurement - multiple wounds INTERVENTIONS - Wound Dressings X - Small Wound Dressing one or multiple wounds 1 10 []  - 0 Medium Wound Dressing one or multiple wounds []  - 0 Large Wound Dressing one or multiple wounds []  - 0 Application of Medications - topical []  - 0 Application of Medications - injection INTERVENTIONS - Miscellaneous []  - External ear exam 0 []  - 0 Specimen Collection (cultures, biopsies, blood, body fluids, etc.) []  - 0 Specimen(s) / Culture(s) sent or taken to Lab for analysis []  - 0 Patient Transfer (multiple staff / Civil Service fast streamer / Similar devices) []  - 0 Simple Staple / Suture removal (25 or less) []  - 0 Complex Staple / Suture removal (26 or more) []  - 0 Hypo / Hyperglycemic Management (close monitor of Blood Glucose) []  - 0 Ankle / Brachial Index (ABI) - do not check if billed separately X- 1 5 Vital Signs [name], Joanna H. ([id number]) Has the patient been seen at the hospital within the last three years: Yes Total Score: 95 Level Of Care: New/Established - Level 3 Electronic Signature(s) Signed: 02/11/2018 4:48:19 PM By: Montey Hora Entered By: Montey Hora on 02/11/2018 10:45:48 [name] ([id number]) -------------------------------------------------------------------------------- Encounter Discharge Information Details Patient Name: [name]. Date of Service: 02/11/2018 9:30 AM Medical Record Number: [id number] Patient Account Number: [id number] Date of Birth/Sex: August 20, 1946 (71 y.o. F) Treating RN: Montey Hora Primary Care Sharleen Szczesny: Leanna Battles Other Clinician: Referring Joclynn Lumb: Leanna Battles Treating Gwenda Heiner/Extender: Tito Dine in Treatment: 6 Encounter Discharge Information Items Discharge Pain Level: 0 Discharge Condition: Stable Ambulatory Status: Ambulatory Emergency Discharge Destination: Room Transportation: Private Auto Accompanied By: self Schedule Follow-up Appointment: Yes Medication Reconciliation completed and No provided to Patient/Care Mateja Dier: Provided on Clinical Summary of Care: 02/11/2018 Form Type Recipient Paper Patient LR Notes atient sent to [facility] ED for possible abdominal cellulitis Electronic Signature(s) Signed: 02/11/2018 4:48:19 PM By: Montey Hora Entered By: Montey Hora on 02/11/2018 11:03:47 [name] ([id number]) -------------------------------------------------------------------------------- Lower Extremity Assessment Details Patient Name: [name]. Date of Service: 02/11/2018 9:30 AM Medical Record Number: [id number] Patient Account Number: [id number] Date of Birth/Sex: July 17, 1946 (72 y.o. F) Treating RN: Roger Shelter Primary Care Thaddaeus Granja: Leanna Battles Other Clinician: Referring Stepfanie Yott: Leanna Battles Treating Frida Wahlstrom/Extender: Ricard Dillon Weeks in Treatment: 6 Electronic Signature(s) Signed: 02/11/2018 4:21:17 PM By: Roger Shelter Entered By: Roger Shelter on 02/11/2018 10:23:49 [name] ([id number]) -------------------------------------------------------------------------------- Multi Wound Chart Details Patient Name: [name]. Date of Service: 02/11/2018 9:30 AM Medical Record Number: [id number] Patient Account Number: [id number] Date of Birth/Sex: [birth year] (72 y.o. F) Treating RN: Montey Hora Primary Care Merion Grimaldo: Leanna Battles Other Clinician: Referring Zeke Aker: Leanna Battles Treating Neeko Pharo/Extender: Tito Dine in Treatment:  6 Vital Signs Height(in): 63 Pulse(bpm): 115 Weight(lbs): 120 Blood Pressure(mmHg): 156/85 Body Mass Index(BMI): 21 Temperature(F): 98.2 Respiratory Rate 16 (breaths/min): Photos: [1:No Photos] [N/A:N/A] Wound Location: [1:Coccyx] [N/A:N/A] Wounding Event: [1:Pressure Injury] [N/A:N/A] Primary Etiology: [1:Pressure Ulcer] [N/A:N/A] Comorbid History: [1:Cataracts, Hypertension, Type N/A II Diabetes, Osteoarthritis] Date Acquired: [1:10/20/2017] [N/A:N/A] Weeks of Treatment: [1:6] [N/A:N/A] Wound Status: [1:Open] [N/A:N/A] Measurements L x W x D [1:1x0.5x0.1] [N/A:N/A] (cm) Area (cm) : [1:0.393] [N/A:N/A] Volume (cm) : [1:0.039] [N/A:N/A] % Reduction in Area: [1:94.20%] [N/A:N/A] % Reduction in Volume: [  1:94.30%] [N/A:N/A] Classification: [1:Category/Stage III] [N/A:N/A] Exudate Amount: [1:Medium] [N/A:N/A] Exudate Type: [1:Serous] [N/A:N/A] Exudate Color: [1:amber] [N/A:N/A] Wound Margin: [1:Flat and Intact] [N/A:N/A] Granulation Amount: [1:Large (67-100%)] [N/A:N/A] Granulation Quality: [1:Pink] [N/A:N/A] Necrotic Amount: [1:Small (1-33%)] [N/A:N/A] Necrotic Tissue: [1:Eschar, Adherent Slough] [N/A:N/A] Exposed Structures: [1:Fascia: No Fat Layer (Subcutaneous Tissue) Exposed: No Tendon: No Muscle: No Joint: No Bone: No] [N/A:N/A] Epithelialization: [1:Small (1-33%)] [N/A:N/A] Periwound Skin Texture: [1:Excoriation: No Induration: No Callus: No Crepitus: No] [N/A:N/A] Rash: No Scarring: No Periwound Skin Moisture: Maceration: Yes N/A N/A Dry/Scaly: No Periwound Skin Color: Atrophie Blanche: No N/A N/A Cyanosis: No Ecchymosis: No Erythema: No Hemosiderin Staining: No Mottled: No Pallor: No Rubor: No Temperature: No Abnormality N/A N/A Tenderness on Palpation: Yes N/A N/A Wound Preparation: Ulcer Cleansing: N/A N/A Rinsed/Irrigated with Saline Topical Anesthetic Applied: Other: hurricaine spray Treatment Notes Wound #1 (Coccyx) 1. Cleansed with: Clean  wound with Normal Saline 4. Dressing Applied: Other dressing (specify in notes) 5. Secondary Dressing Applied Bordered Foam Dressing Notes silvercel Electronic Signature(s) Signed: 02/11/2018 4:28:35 PM By: Linton Ham MD Entered By: Linton Ham on 02/11/2018 11:30:37 [name] ([id number]) -------------------------------------------------------------------------------- Multi-Disciplinary Care Plan Details Patient Name: [name] Date of Service: 02/11/2018 9:30 AM Medical Record Number: [id number] Patient Account Number: [id number] Date of Birth/Sex: 1946/03/22 (72 y.o. F) Treating RN: Montey Hora Primary Care Lacretia Tindall: Leanna Battles Other Clinician: Referring Burnice Vassel: Leanna Battles Treating Stuart Guillen/Extender: Tito Dine in Treatment: 6 Active Inactive ` Abuse / Safety / Falls / Self Care Management Nursing Diagnoses: Impaired physical mobility Goals: Patient will not experience any injury related to falls Date Initiated: 12/31/2017 Target Resolution Date: 03/21/2018 Goal Status: Active Interventions: Assess fall risk on admission and as needed Notes: ` Orientation to the Wound Care Program Nursing Diagnoses: Knowledge deficit related to the wound healing center program Goals: Patient/caregiver will verbalize understanding of the [facility] Date Initiated: 12/31/2017 Target Resolution Date: 03/21/2018 Goal Status: Active Interventions: Provide education on orientation to the wound center Notes: ` Wound/Skin Impairment Nursing Diagnoses: Impaired tissue integrity Goals: Ulcer/skin breakdown will heal within 14 weeks Date Initiated: 12/31/2017 Target Resolution Date: 01/17/2018 Goal Status: Active Interventions: [name], [name] ([id number]) Assess patient/caregiver ability to obtain necessary supplies Assess patient/caregiver ability to perform ulcer/skin care regimen upon admission and as needed Assess  ulceration(s) every visit Notes: Electronic Signature(s) Signed: 02/11/2018 4:48:19 PM By: Montey Hora Entered By: Montey Hora on 02/11/2018 10:32:21 [name] ([id number]) -------------------------------------------------------------------------------- Pain Assessment Details Patient Name: [name]. Date of Service: 02/11/2018 9:30 AM Medical Record Number: [id number] Patient Account Number: [id number] Date of Birth/Sex: 12/17/1945 (72 y.o. F) Treating RN: Roger Shelter Primary Care Elvenia Godden: Leanna Battles Other Clinician: Referring Adolph Clutter: Leanna Battles Treating Sundae Maners/Extender: Tito Dine in Treatment: 6 Active Problems Location of Pain Severity and Description of Pain Patient Has Paino No Site Locations Pain Management and Medication Current Pain Management: Electronic Signature(s) Signed: 02/11/2018 4:21:17 PM By: Roger Shelter Entered By: Roger Shelter on 02/11/2018 10:19:26 [name] ([id number]) -------------------------------------------------------------------------------- Patient/Caregiver Education Details Patient Name: [name] Date of Service: 02/11/2018 9:30 AM Medical Record Number: [id number] Patient Account Number: [id number] Date of Birth/Gender: 05/19/1946 (72 y.o. F) Treating RN: Montey Hora Primary Care Physician: Leanna Battles Other Clinician: Referring Physician: Leanna Battles Treating Physician/Extender: Tito Dine in Treatment: 6 Education Assessment Education Provided To: Patient Education Topics Provided Wound/Skin Impairment: Handouts: Other: wound care as ordered Methods: Demonstration, Explain/Verbal Responses: State content correctly Electronic Signature(s) Signed: 02/11/2018  4:48:19 PM By: Montey Hora Entered By: Montey Hora on 02/11/2018 11:04:10 [name]  ([id number]) -------------------------------------------------------------------------------- Wound Assessment Details Patient Name: [name]. Date of Service: 02/11/2018 9:30 AM Medical Record Number: [id number] Patient Account Number: [id number] Date of Birth/Sex: 11/13/1945 (72 y.o. F) Treating RN: Roger Shelter Primary Care Tinya Cadogan: Leanna Battles Other Clinician: Referring Haydn Cush: Leanna Battles Treating Lariya Kinzie/Extender: Tito Dine in Treatment: 6 Wound Status Wound Number: 1 Primary Pressure Ulcer Etiology: Wound Location: Coccyx Wound Status: Open Wounding Event: Pressure Injury Comorbid Cataracts, Hypertension, Type II Diabetes, Date Acquired: 10/20/2017 History: Osteoarthritis Weeks Of Treatment: 6 Clustered Wound: No Photos Photo Uploaded By: Roger Shelter on 02/11/2018 16:17:22 Wound Measurements Length: (cm) 1 Width: (cm) 0.5 Depth: (cm) 0.1 Area: (cm) 0.393 Volume: (cm) 0.039 % Reduction in Area: 94.2% % Reduction in Volume: 94.3% Epithelialization: Small (1-33%) Tunneling: No Undermining: No Wound Description Classification: Category/Stage III Wound Margin: Flat and Intact Exudate Amount: Medium Exudate Type: Serous Exudate Color: amber Foul Odor After Cleansing: No Slough/Fibrino Yes Wound Bed Granulation Amount: Large (67-100%) Exposed Structure Granulation Quality: Pink Fascia Exposed: No Necrotic Amount: Small (1-33%) Fat Layer (Subcutaneous Tissue) Exposed: No Necrotic Quality: Eschar, Adherent Slough Tendon Exposed: No Muscle Exposed: No Joint Exposed: No Bone Exposed: No Periwound Skin Texture [name], Joanna H. ([id number]) Texture Color No Abnormalities Noted: No No Abnormalities Noted: No Callus: No Atrophie Blanche: No Crepitus: No Cyanosis: No Excoriation: No Ecchymosis: No Induration: No Erythema: No Rash: No Hemosiderin Staining: No Scarring: No Mottled: No Pallor: No Moisture Rubor:  No No Abnormalities Noted: No Dry / Scaly: No Temperature / Pain Maceration: Yes Temperature: No Abnormality Tenderness on Palpation: Yes Wound Preparation Ulcer Cleansing: Rinsed/Irrigated with Saline Topical Anesthetic Applied: Other: hurricaine spray, Treatment Notes Wound #1 (Coccyx) 1. Cleansed with: Clean wound with Normal Saline 4. Dressing Applied: Other dressing (specify in notes) 5. Secondary Dressing Applied Bordered Foam Dressing Notes silvercel Electronic Signature(s) Signed: 02/11/2018 4:21:17 PM By: Roger Shelter Entered By: Roger Shelter on 02/11/2018 10:23:35 [name] ([id number]) -------------------------------------------------------------------------------- Vitals Details Patient Name: [name] Date of Service: 02/11/2018 9:30 AM Medical Record Number: [id number] Patient Account Number: [id number] Date of Birth/Sex: May 30, 1946 (72 y.o. F) Treating RN: Roger Shelter Primary Care Taeler Winning: Leanna Battles Other Clinician: Referring Maelle Sheaffer: Leanna Battles Treating Fayelynn Distel/Extender: Tito Dine in Treatment: 6 Vital Signs Time Taken: 10:19 Temperature (F): 98.2 Height (in): 63 Pulse (bpm): 115 Weight (lbs): 120 Respiratory Rate (breaths/min): 16 Body Mass Index (BMI): 21.3 Blood Pressure (mmHg): 156/85 Reference Range: 80 - 120 mg / dl Electronic Signature(s) Signed: 02/11/2018 4:21:17 PM By: Roger Shelter Entered By: Roger Shelter on 02/11/2018 10:19:47

## 2018-02-14 NOTE — Progress Notes (Signed)
DAYLEN, HACK ([id number]) Visit Report for 02/11/2018 HPI Details Patient Name: [name], [name]. Date of Service: 02/11/2018 9:30 AM Medical Record Number: [id number] Patient Account Number: [id number] Date of Birth/Sex: 14-Dec-1945 (72 y.o. 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 She had a complicated postoperative course with respiratory failure, acute bilateral watershed cerebral infarcts, reintubation, tracheostomy, postoperative healing by secondary intention with NPWT, blood transfusion; she was treated with broad-spectrum antibiotic (meropenem, vancomycin, Zosyn, Unasyn) and antifungal (anidulafungin). She was also noted to have a sacral pressure ulcer during this admission, treated with Santyl. She was discharged to inpatient rehabilitation, then to SNF/rehab ([place]) with plans to discharge home this weekend. She's been experiencing multiple complications with ileostomy since arriving to [facility], staff at facility unable to maintain appliance with noted peristomal skin breakdown. We discussed at length ostomy care and skin related challenges with ileostomy effluent. Her daughter-in-law accompanies her today, with multiple questions answered. She presents with a stage 3 sacral/coccyx pressure ulcer. She is ambulatory and has a foam cushion in her wheelchair. The facility has been placing collagen per the daughter-in-law. 01/07/18; this is a patient who came in predominantly related to  issues with a new ileostomy. Apparently changes were made and the ostomy is doing better. She is of course going to have a large amount of ostomy output from an ileostomy and she seems aware of that. During the course of last week's visit she was incidentally discovered to have a pressure ulcer on the sacrum. Using silver alginate this is smaller 01/13/18; the patient has a pressure ulcer on her left gluteal area that was discovered incidentally after review for ileostomy issues. She is at [facility] skilled facility and she is apparently going home next Tuesday. We had given orders for so over alginate and border foam to the pressure ulcer which is stage 3 however she had Xeroform on this today. 4//10/19; the patient has a pressure ulcer on her left gluteal in close proximity to her coccyx. This is smaller than her last visit. There is no surrounding infection. She actually did not leave the facility 2 weeks ago is average size but she is going home next Monday. oShe is having a lot of trouble with adherence of the ostomy bag. Ostomy effluent is draining onto her skin causing irritation. Our intake nurse called the ostomy nurse at the hospital to come over and have a look at this. 02/11/18;the patient returns for two-week follow-up. she is now back at home after being discharged from [facility]. The last time she was here she had ostomy problems as well as a stage II pressure ulcer on her coccyx. We had the ostomy nurse come over and she changed the ostomy seal. The patient returns today things have not gone well. She cannot maintain at obtain a seal. She has constant drainage. It is not clear to me that the nursing home made discharge arrangements for care of her pressure ulcer on her coccyx which is a stage II wound. I don't think anything is being done to this. Electronic Signature(s) Signed: 02/11/2018 4:28:35 PM By: Linton Ham MD Entered By:  Linton Ham on 02/11/2018 11:33:20 [name], [name] ([id number]) -------------------------------------------------------------------------------- Physical Exam Details Patient Name: MELANI, BRISBANE. Date of Service: 02/11/2018 9:30 AM Medical Record Number: [id number] Patient Account Number: [id number] Date of Birth/Sex: 04/15/1946 (72 y.o. F) Treating RN: Montey Hora Primary Care Provider: Leanna Battles Other Clinician: Referring Provider: Leanna Battles Treating Provider/Extender: Tito Dine in Treatment: 6 Constitutional Patient is hypertensive.. Pulse regular and within target range for patient.Marland Kitchen Respirations regular, non-labored and within target range.. Temperature is normal and within the target range for the patient.Marland Kitchen appears in no distress.she is not systemically unwell. Eyes Conjunctivae clear. No discharge. Respiratory Respiratory effort is easy and symmetric bilaterally. Rate is normal at rest and on room air.. Gastrointestinal (GI) the patient has significant erythema wide spread around the ostomy site. Very tender. I cannot rule out cellulitis-year-old O a lot of this may be contact dermatitis from continued drainage. Nevertheless I don't know how we are going to maintain an ostomy seal with the skin in this condition.. Integumentary (Hair, Skin) other than the erythema around the site of the ostomy. No systemic skin issues. Psychiatric No evidence of depression, anxiety, or agitation. Calm, cooperative, and communicative. Appropriate interactions and affect.. Notes wound exam; the area over the lower sacrum is a small stage 3 wound on the left buttock just close to the sacrum. This was initially A deeper and larger wound. It appears to be making progress. oUnfortunately the area around the ileostomy in the right lower abdominal quadrant is really in very bad shape. Intense erythema around the site date then ostomy output. She is very tender in the erythema and I can't exclude a abdominal  wall cellulitis although this could all be contact dermatitis which is severe. In either case I can't see how they are going to maintain ostomy seal even if they can come up with another option. Electronic Signature(s) Signed: 02/11/2018 4:28:35 PM By: Linton Ham MD Entered By: Linton Ham on 02/11/2018 11:36:33 [name] ([id number]) -------------------------------------------------------------------------------- Physician Orders Details Patient Name: [name] Date of Service: 02/11/2018 9:30 AM Medical Record Number: [id number] Patient Account Number: [id number] Date of Birth/Sex: 1946-01-10 (72 y.o. F) Treating RN: Montey Hora Primary Care Provider: Leanna Battles Other Clinician: Referring Provider: Leanna Battles Treating Provider/Extender: Tito Dine in Treatment: 6 Verbal / Phone Orders: No Diagnosis Coding Wound Cleansing Wound #1 Coccyx o Clean wound with Normal Saline. o May Shower, gently pat wound dry prior to applying new dressing. Anesthetic (add to Medication List) Wound #1 Coccyx o Topical Lidocaine 4% cream applied to wound bed prior to debridement (In Clinic Only). Primary Wound Dressing Wound #1 Coccyx o Silver Alginate - Coccyx Secondary Dressing Wound #1 Coccyx o Boardered Foam Dressing - Coccyx Dressing Change Frequency Wound #1 Coccyx o Change dressing every other day. Follow-up Appointments Wound #1 Coccyx o Return Appointment in 2 weeks. Home Health Wound #1 [place] Visits - [facility] please call [place] with a fax number and the company name so orders can be faxed...[place] Nurse may visit PRN to address patientos wound care needs. o FACE TO FACE ENCOUNTER: MEDICARE and MEDICAID PATIENTS: I certify that this patient is under my care and that I had a face-to-face encounter that meets the physician face-to-face encounter requirements with  this patient on this date. The encounter with the patient was in whole or in part for the following MEDICAL CONDITION: (primary reason for Home  Healthcare) MEDICAL NECESSITY: I certify, that based on my findings, NURSING services are a medically necessary home health service. HOME BOUND STATUS: I certify that my clinical findings support that this patient is homebound (i.e., Due to illness or injury, pt requires aid of supportive devices such as crutches, cane, wheelchairs, walkers, the use of special transportation or the assistance of another person to leave their place of residence. There is a normal inability to leave the home and doing so requires considerable and taxing effort. Other absences are for medical reasons / religious services and are infrequent or of short duration when for other reasons). o If current dressing causes regression in wound condition, may D/C ordered dressing product/s and apply Normal Saline Moist Dressing daily until next [place] / Other MD appointment. [place] of regression in wound condition at [phone]. ZALAYAH, PIZZUTO ([id number]) o Please direct any NON-WOUND related issues/requests for orders to patient's Primary Care Physician Notes Patient to go to the ER for possible cellulitis in ileostomy site Electronic Signature(s) Signed: 02/11/2018 4:28:35 PM By: Linton Ham MD Signed: 02/11/2018 4:48:19 PM By: Montey Hora Entered By: Montey Hora on 02/11/2018 10:40:19 [name] ([id number]) -------------------------------------------------------------------------------- Problem List Details Patient Name: [name]. Date of Service: 02/11/2018 9:30 AM Medical Record Number: [id number] Patient Account Number: [id number] Date of Birth/Sex: 1946-06-15 (72 y.o. F) Treating RN: Montey Hora Primary Care Provider: Leanna Battles Other Clinician: Referring Provider: Leanna Battles Treating  Provider/Extender: Tito Dine in Treatment: 6 Active Problems ICD-10 Impacting Encounter Code Description Active Date Wound Healing Diagnosis L89.153 Pressure ulcer of sacral region, stage 3 12/31/2017 Yes Z93.2 Ileostomy status 12/31/2017 Yes R54 Age-related physical debility 12/31/2017 Yes S30.92XS Unspecified superficial injury of abdominal wall, sequela 12/31/2017 Yes Inactive Problems Resolved Problems Electronic Signature(s) Signed: 02/11/2018 4:28:35 PM By: Linton Ham MD Entered By: Linton Ham on 02/11/2018 11:30:21 [name] ([id number]) -------------------------------------------------------------------------------- Progress Note Details Patient Name: [name]. Date of Service: 02/11/2018 9:30 AM Medical Record Number: [id number] Patient Account Number: [id number] Date of Birth/Sex: September 08, 1946 (72 y.o. 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 She had a complicated postoperative course with respiratory failure, acute bilateral watershed cerebral infarcts, reintubation, tracheostomy, postoperative healing by secondary intention with NPWT, blood transfusion; she was treated with broad-spectrum antibiotic (meropenem, vancomycin, Zosyn, Unasyn) and antifungal (anidulafungin). She was also noted to have a sacral pressure ulcer during this admission, treated with Santyl. She was discharged to inpatient rehabilitation, then to SNF/rehab ([place]) with plans to  discharge home this weekend. She's been experiencing multiple complications with ileostomy since arriving to [facility], staff at facility unable to maintain appliance with noted peristomal skin breakdown. We discussed at length ostomy care and skin related challenges with ileostomy effluent. Her daughter-in-law accompanies her today, with multiple questions answered. She presents with a stage 3 sacral/coccyx pressure ulcer. She is ambulatory and has a foam cushion in her wheelchair. The facility has been placing collagen per the daughter-in-law. 01/07/18; this is a patient who came in predominantly related to issues with a new ileostomy. Apparently changes were made and the ostomy  is doing better. She is of course going to have a large amount of ostomy output from an ileostomy and she seems aware of that. During the course of last week's visit she was incidentally discovered to have a pressure ulcer on the sacrum. Using silver alginate this is smaller 01/13/18; the patient has a pressure ulcer on her left gluteal area that was discovered incidentally after review for ileostomy issues. She is at [facility] skilled facility and she is apparently going home next Tuesday. We had given orders for so over alginate and border foam to the pressure ulcer which is stage 3 however she had Xeroform on this today. 4//10/19; the patient has a pressure ulcer on her left gluteal in close proximity to her coccyx. This is smaller than her last visit. There is no surrounding infection. She actually did not leave the facility 2 weeks ago is average size but she is going home next Monday. She is having a lot of trouble with adherence of the ostomy bag. Ostomy effluent is draining onto her skin causing irritation. Our intake nurse called the ostomy nurse at the hospital to come over and have a look at this. 02/11/18;the patient returns for two-week follow-up. she is now back at home after being discharged from [facility]. The last time she was here she had ostomy problems as well as a stage II pressure ulcer on her coccyx. We had the ostomy nurse come over and she changed the ostomy seal. The patient returns today things have not gone well. She cannot maintain at obtain a seal. She has constant drainage. It is not clear to me that the nursing home made discharge arrangements for care of her pressure ulcer on her coccyx which is a stage II wound. I don't think anything is being done to this. Objective Constitutional Patient is hypertensive.. Pulse regular and within target range for patient.Marland Kitchen Respirations regular, non-labored and within target range.. Temperature is normal and within the target range for the patient.Marland Kitchen appears in no distress.she is not systemically unwell. [name], [name] ([id number]) Vitals Time Taken: 10:19 AM, Height: 63 in, Weight: 120 lbs, BMI: 21.3, Temperature: 98.2 F, Pulse: 115 bpm, Respiratory Rate: 16 breaths/min, Blood Pressure: 156/85 mmHg. Eyes Conjunctivae clear. No discharge. Respiratory Respiratory effort is easy and symmetric bilaterally. Rate is normal at rest and on room air.. Gastrointestinal (GI) the patient has significant erythema wide spread around the ostomy site. Very tender. I cannot rule out cellulitis-year-old O a lot of this may be contact dermatitis from continued drainage. Nevertheless I don't know how we are going to maintain an ostomy seal with the skin in this condition.Marland Kitchen Psychiatric No evidence of depression, anxiety, or agitation. Calm, cooperative, and communicative. Appropriate interactions and affect.. General Notes: wound exam; the area over the lower sacrum is a small stage 3 wound on the left buttock just close to the sacrum. This was initially A deeper and larger wound. It appears to be making progress. Unfortunately the area around the ileostomy in the right lower abdominal quadrant is really in very bad shape. Intense erythema around the  site date then ostomy output. She is very tender in the erythema and I can't exclude a abdominal wall cellulitis although this could all be contact dermatitis which is severe. In either case I can't see how they are going to maintain ostomy seal even if they can come up with another option. Integumentary (Hair, Skin) other than the erythema around the site of the ostomy. No  systemic skin issues. Wound #1 status is Open. Original cause of wound was Pressure Injury. The wound is located on the Coccyx. The wound measures 1cm length x 0.5cm width x 0.1cm depth; 0.393cm^2 area and 0.039cm^3 volume. There is no tunneling or undermining noted. There is a medium amount of serous drainage noted. The wound margin is flat and intact. There is large (67-100%) pink granulation within the wound bed. There is a small (1-33%) amount of necrotic tissue within the wound bed including Eschar and Adherent Slough. The periwound skin appearance exhibited: Maceration. The periwound skin appearance did not exhibit: Callus, Crepitus, Excoriation, Induration, Rash, Scarring, Dry/Scaly, Atrophie Blanche, Cyanosis, Ecchymosis, Hemosiderin Staining, Mottled, Pallor, Rubor, Erythema. Periwound temperature was noted as No Abnormality. The periwound has tenderness on palpation. Assessment Active Problems ICD-10 L89.153 - Pressure ulcer of sacral region, stage 3 Z93.2 - Ileostomy status R54 - Age-related physical debility S30.92XS - Unspecified superficial injury of abdominal wall, sequela Plan [name], Joanna H. ([id number]) Wound Cleansing: Wound #1 Coccyx: Clean wound with Normal Saline. May Shower, gently pat wound dry prior to applying new dressing. Anesthetic (add to Medication List): Wound #1 Coccyx: Topical Lidocaine 4% cream applied to wound bed prior to debridement (In Clinic Only). Primary Wound Dressing: Wound #1 Coccyx: Silver Alginate - Coccyx Secondary Dressing: Wound #1 Coccyx: Boardered Foam Dressing  - Coccyx Dressing Change Frequency: Wound #1 Coccyx: Change dressing every other day. Follow-up Appointments: Wound #1 Coccyx: Return Appointment in 2 weeks. Home Health: Wound #1 Coccyx: Continue Home Health Visits - [facility] please call [place] with a fax number and the company name so orders can be faxed...[place] may visit PRN to address patient s wound care needs. FACE TO FACE ENCOUNTER: MEDICARE and MEDICAID PATIENTS: I certify that this patient is under my care and that I had a face-to-face encounter that meets the physician face-to-face encounter requirements with this patient on this date. The encounter with the patient was in whole or in part for the following MEDICAL CONDITION: (primary reason for [place]) MEDICAL NECESSITY: I certify, that based on my findings, NURSING services are a medically necessary home health service. HOME BOUND STATUS: I certify that my clinical findings support that this patient is homebound (i.e., Due to illness or injury, pt requires aid of supportive devices such as crutches, cane, wheelchairs, walkers, the use of special transportation or the assistance of another person to leave their place of residence. There is a normal inability to leave the home and doing so requires considerable and taxing effort. Other absences are for medical reasons / religious services and are infrequent or of short duration when for other reasons). If current dressing causes regression in wound condition, may D/C ordered dressing product/s and apply Normal Saline Moist Dressing daily until next [place] / Other MD appointment. [place] of regression in wound condition at [phone]. Please direct any NON-WOUND related issues/requests for orders to patient's Primary Care Physician General Notes: Patient to go to the ER for possible cellulitis in ileostomy site #1 I couldn't think of anything else  to do but to send this patient to the ER. I'm concerned about possibility of cellulitis here plus or minus his severe irritant contact dermatitis. #2 I am not an expert in ostomy issues. Ileostomies have a considerable amount of output on a daily basis. The patient has not been able to maintain a seal. Perhaps an ostomy tube connected to a bag at  least in the short-term to allow the skin to settle down. Electronic Signature(s) Signed: 02/11/2018 4:28:35 PM By: Linton Ham MD Entered By: Linton Ham on 02/11/2018 11:37:37 [name] ([id number]) -------------------------------------------------------------------------------- SuperBill Details Patient Name: [name] Date of Service: 02/11/2018 Medical Record Number: [id number] Patient Account Number: [id number] Date of Birth/Sex: 08/01/1946 (72 y.o. F) Treating RN: Montey Hora Primary Care Provider: Leanna Battles Other Clinician: Referring Provider: Leanna Battles Treating Provider/Extender: Tito Dine in Treatment: 6 Diagnosis Coding ICD-10 Codes Code Description L89.153 Pressure ulcer of sacral region, stage 3 Z93.2 Ileostomy status R54 Age-related physical debility S30.92XS Unspecified superficial injury of abdominal wall, sequela Facility Procedures CPT4 Code: [id number] Description: 70929 - WOUND CARE VISIT-LEV 3 EST PT Modifier: Quantity: 1 Physician Procedures CPT4 Code: [id number] Description: 37096 - WC PHYS LEVEL 3 - EST PT ICD-10 Diagnosis Description L89.153 Pressure ulcer of sacral region, stage 3 Z93.2 Ileostomy status Modifier: Quantity: 1 Electronic Signature(s) Signed: 02/11/2018 4:28:35 PM By: Linton Ham MD Entered By: Linton Ham on 02/11/2018 11:38:09

## 2018-02-16 DIAGNOSIS — Z432 Encounter for attention to ileostomy: Secondary | ICD-10-CM | POA: Diagnosis not present

## 2018-02-16 DIAGNOSIS — I10 Essential (primary) hypertension: Secondary | ICD-10-CM | POA: Diagnosis not present

## 2018-02-16 DIAGNOSIS — J9611 Chronic respiratory failure with hypoxia: Secondary | ICD-10-CM | POA: Diagnosis not present

## 2018-02-16 DIAGNOSIS — E1151 Type 2 diabetes mellitus with diabetic peripheral angiopathy without gangrene: Secondary | ICD-10-CM | POA: Diagnosis not present

## 2018-02-16 DIAGNOSIS — I69354 Hemiplegia and hemiparesis following cerebral infarction affecting left non-dominant side: Secondary | ICD-10-CM | POA: Diagnosis not present

## 2018-02-16 DIAGNOSIS — J449 Chronic obstructive pulmonary disease, unspecified: Secondary | ICD-10-CM | POA: Diagnosis not present

## 2018-02-17 DIAGNOSIS — Z432 Encounter for attention to ileostomy: Secondary | ICD-10-CM | POA: Diagnosis not present

## 2018-02-17 DIAGNOSIS — I69354 Hemiplegia and hemiparesis following cerebral infarction affecting left non-dominant side: Secondary | ICD-10-CM | POA: Diagnosis not present

## 2018-02-17 DIAGNOSIS — E1151 Type 2 diabetes mellitus with diabetic peripheral angiopathy without gangrene: Secondary | ICD-10-CM | POA: Diagnosis not present

## 2018-02-17 DIAGNOSIS — I10 Essential (primary) hypertension: Secondary | ICD-10-CM | POA: Diagnosis not present

## 2018-02-17 DIAGNOSIS — J449 Chronic obstructive pulmonary disease, unspecified: Secondary | ICD-10-CM | POA: Diagnosis not present

## 2018-02-17 DIAGNOSIS — J9611 Chronic respiratory failure with hypoxia: Secondary | ICD-10-CM | POA: Diagnosis not present

## 2018-02-19 DIAGNOSIS — Z932 Ileostomy status: Secondary | ICD-10-CM | POA: Diagnosis not present

## 2018-02-19 DIAGNOSIS — Z6826 Body mass index (BMI) 26.0-26.9, adult: Secondary | ICD-10-CM | POA: Diagnosis not present

## 2018-02-19 DIAGNOSIS — L308 Other specified dermatitis: Secondary | ICD-10-CM | POA: Diagnosis not present

## 2018-02-20 DIAGNOSIS — Z432 Encounter for attention to ileostomy: Secondary | ICD-10-CM | POA: Diagnosis not present

## 2018-02-20 DIAGNOSIS — J9611 Chronic respiratory failure with hypoxia: Secondary | ICD-10-CM | POA: Diagnosis not present

## 2018-02-20 DIAGNOSIS — I69354 Hemiplegia and hemiparesis following cerebral infarction affecting left non-dominant side: Secondary | ICD-10-CM | POA: Diagnosis not present

## 2018-02-20 DIAGNOSIS — J449 Chronic obstructive pulmonary disease, unspecified: Secondary | ICD-10-CM | POA: Diagnosis not present

## 2018-02-20 DIAGNOSIS — E1151 Type 2 diabetes mellitus with diabetic peripheral angiopathy without gangrene: Secondary | ICD-10-CM | POA: Diagnosis not present

## 2018-02-20 DIAGNOSIS — I10 Essential (primary) hypertension: Secondary | ICD-10-CM | POA: Diagnosis not present

## 2018-02-23 DIAGNOSIS — J449 Chronic obstructive pulmonary disease, unspecified: Secondary | ICD-10-CM | POA: Diagnosis not present

## 2018-02-23 DIAGNOSIS — Z432 Encounter for attention to ileostomy: Secondary | ICD-10-CM | POA: Diagnosis not present

## 2018-02-23 DIAGNOSIS — I69354 Hemiplegia and hemiparesis following cerebral infarction affecting left non-dominant side: Secondary | ICD-10-CM | POA: Diagnosis not present

## 2018-02-23 DIAGNOSIS — I10 Essential (primary) hypertension: Secondary | ICD-10-CM | POA: Diagnosis not present

## 2018-02-23 DIAGNOSIS — E1151 Type 2 diabetes mellitus with diabetic peripheral angiopathy without gangrene: Secondary | ICD-10-CM | POA: Diagnosis not present

## 2018-02-23 DIAGNOSIS — J9611 Chronic respiratory failure with hypoxia: Secondary | ICD-10-CM | POA: Diagnosis not present

## 2018-02-25 ENCOUNTER — Encounter: Payer: Medicare Other | Admitting: Internal Medicine

## 2018-02-25 DIAGNOSIS — L89153 Pressure ulcer of sacral region, stage 3: Secondary | ICD-10-CM | POA: Diagnosis not present

## 2018-02-25 DIAGNOSIS — Z932 Ileostomy status: Secondary | ICD-10-CM | POA: Diagnosis not present

## 2018-02-25 DIAGNOSIS — R54 Age-related physical debility: Secondary | ICD-10-CM | POA: Diagnosis not present

## 2018-02-27 DIAGNOSIS — R600 Localized edema: Secondary | ICD-10-CM | POA: Diagnosis not present

## 2018-02-27 DIAGNOSIS — J449 Chronic obstructive pulmonary disease, unspecified: Secondary | ICD-10-CM | POA: Diagnosis not present

## 2018-02-27 DIAGNOSIS — J9611 Chronic respiratory failure with hypoxia: Secondary | ICD-10-CM | POA: Diagnosis not present

## 2018-02-27 DIAGNOSIS — I69354 Hemiplegia and hemiparesis following cerebral infarction affecting left non-dominant side: Secondary | ICD-10-CM | POA: Diagnosis not present

## 2018-02-27 DIAGNOSIS — Z432 Encounter for attention to ileostomy: Secondary | ICD-10-CM | POA: Diagnosis not present

## 2018-02-27 DIAGNOSIS — I10 Essential (primary) hypertension: Secondary | ICD-10-CM | POA: Diagnosis not present

## 2018-02-27 DIAGNOSIS — E1151 Type 2 diabetes mellitus with diabetic peripheral angiopathy without gangrene: Secondary | ICD-10-CM | POA: Diagnosis not present

## 2018-02-27 NOTE — Progress Notes (Signed)
WALTER, MIN ([id number]) Visit Report for 02/25/2018 Arrival Information Details Patient Name: [name], [name]. Date of Service: 02/25/2018 11:15 AM Medical Record Number: [id number] Patient Account Number: [id number] Date of Birth/Sex: Jul 31, 1946 (72 y.o. F) Treating RN: Roger Shelter Primary Care Ignatz Deis: Leanna Battles Other Clinician: Referring Beth Goodlin: Leanna Battles Treating Shavon Zenz/Extender: Tito Dine in Treatment: 8 Visit Information History Since Last Visit All ordered tests and consults were completed: No Patient Arrived: Ambulatory Added or deleted any medications: No Arrival Time: 11:57 Any new allergies or adverse reactions: No Accompanied By: self Had a fall or experienced change in No Transfer Assistance: None activities of daily living that may affect Patient Identification Verified: Yes risk of falls: Secondary Verification Process Completed: Yes Signs or symptoms of abuse/neglect since last visito No Patient Requires Transmission-Based No Hospitalized since last visit: No Precautions: Implantable device outside of the clinic excluding No Patient Has Alerts: Yes cellular tissue based products placed in the center Patient Alerts: DMII since last visit: Pain Present Now: No Electronic Signature(s) Signed: 02/25/2018 3:23:03 PM By: Roger Shelter Entered By: Roger Shelter on 02/25/2018 11:58:11 [name] ([id number]) -------------------------------------------------------------------------------- Clinic Level of Care Assessment Details Patient Name: [name], [name]. Date of Service: 02/25/2018 11:15 AM Medical Record Number: [id number] Patient Account Number: [id number] Date of Birth/Sex: 01-14-1946 (72 y.o. F) Treating RN: Cornell Barman Primary Care Hani Campusano: Leanna Battles Other Clinician: Referring Zamar Odwyer: Leanna Battles Treating Cas Tracz/Extender: Tito Dine in Treatment: 8 Clinic Level of Care  Assessment Items TOOL 3 Quantity Score []  - Use when EandM and Procedure is performed on FOLLOW-UP visit 0 ASSESSMENTS - Nursing Assessment / Reassessment []  - Reassessment of Co-morbidities (includes updates in patient status) 0 X- 1 5 Reassessment of Adherence to Treatment Plan ASSESSMENTS - Wound and Skin Assessment / Reassessment []  - Points for Wound Assessment can only be taken for a new wound of unknown or different 0 etiology and a procedure is NOT performed to that wound []  - 0 Simple Wound Assessment / Reassessment - one wound []  - 0 Complex Wound Assessment / Reassessment - multiple wounds X- 1 10 Dermatologic / Skin Assessment (not related to wound area) ASSESSMENTS - Focused Assessment []  - Circumferential Edema Measurements - multi extremities 0 []  - 0 Nutritional Assessment / Counseling / Intervention []  - 0 Lower Extremity Assessment (monofilament, tuning fork, pulses) []  - 0 Peripheral Arterial Disease Assessment (using hand held doppler) ASSESSMENTS - Ostomy and/or Continence Assessment and Care []  - Incontinence Assessment and Management 0 []  - 0 Ostomy Care Assessment and Management (repouching, etc.) PROCESS - Coordination of Care []  - Points for Discharge Coordination can only be taken for a new wound of unknown or different 0 etiology and a procedure is NOT performed to that wound X- 1 15 Simple Patient / Family Education for ongoing care []  - 0 Complex (extensive) Patient / Family Education for ongoing care []  - 0 Staff obtains Programmer, systems, Records, Test Results / Process Orders []  - 0 Staff telephones HHA, Nursing Homes / Clarify orders / etc []  - 0 Routine Transfer to another Facility (non-emergent condition) []  - 0 Routine Hospital Admission (non-emergent condition) [name], [name]. ([id number]) []  - 0 New Admissions / Biomedical engineer / Ordering NPWT, Apligraf, etc. []  - 0 Emergency Hospital Admission (emergent condition) X- 1  10 Simple Discharge Coordination []  - 0 Complex (extensive) Discharge Coordination PROCESS - Special Needs []  - Pediatric / Minor Patient Management 0 []  - 0 Isolation Patient Management []  -  0 Hearing / Language / Visual special needs []  - 0 Assessment of Community assistance (transportation, D/C planning, etc.) []  - 0 Additional assistance / Altered mentation []  - 0 Support Surface(s) Assessment (bed, cushion, seat, etc.) INTERVENTIONS - Wound Cleansing / Measurement []  - Points for Wound Cleaning / Measurement, Wound Dressing, Specimen Collection and 0 Specimen taken to lab can only be taken for a new wound of unknown or different etiology and a procedure is NOT performed to that wound []  - 0 Simple Wound Cleansing - one wound []  - 0 Complex Wound Cleansing - multiple wounds []  - 0 Wound Imaging (photographs - any number of wounds) []  - 0 Wound Tracing (instead of photographs) []  - 0 Simple Wound Measurement - one wound []  - 0 Complex Wound Measurement - multiple wounds INTERVENTIONS - Wound Dressings []  - Small Wound Dressing one or multiple wounds 0 []  - 0 Medium Wound Dressing one or multiple wounds []  - 0 Large Wound Dressing one or multiple wounds INTERVENTIONS - Miscellaneous []  - External ear exam 0 []  - 0 Specimen Collection (cultures, biopsies, blood, body fluids, etc.) []  - 0 Specimen(s) / Culture(s) sent or taken to Lab for analysis []  - 0 Patient Transfer (multiple staff / Civil Service fast streamer / Similar devices) []  - 0 Simple Staple / Suture removal (25 or less) []  - 0 Complex Staple / Suture removal (26 or more) Borntreger, Thanvi H. ([id number]) []  - 0 Hypo / Hyperglycemic Management (close monitor of Blood Glucose) []  - 0 Ankle / Brachial Index (ABI) - do not check if billed separately []  - 0 Vital Signs Has the patient been seen at the hospital within the last three years: Yes Total Score: 40 Level Of Care: New/Established - Level 2 Electronic  Signature(s) Signed: 02/25/2018 4:52:44 PM By: Gretta Cool, BSN, RN, CWS, Kim RN, BSN Entered By: Gretta Cool, BSN, RN, CWS, Kim on 02/25/2018 12:23:31 [name] ([id number]) -------------------------------------------------------------------------------- Lower Extremity Assessment Details Patient Name: [name]. Date of Service: 02/25/2018 11:15 AM Medical Record Number: [id number] Patient Account Number: [id number] Date of Birth/Sex: 1945-11-01 (72 y.o. F) Treating RN: Roger Shelter Primary Care Avagail Whittlesey: Leanna Battles Other Clinician: Referring Caide Campi: Leanna Battles Treating Roslin Norwood/Extender: Ricard Dillon Weeks in Treatment: 8 Electronic Signature(s) Signed: 02/25/2018 3:23:03 PM By: Roger Shelter Entered By: Roger Shelter on 02/25/2018 12:02:46 [name] ([id number]) -------------------------------------------------------------------------------- Multi Wound Chart Details Patient Name: [name]. Date of Service: 02/25/2018 11:15 AM Medical Record Number: [id number] Patient Account Number: [id number] Date of Birth/Sex: 09/03/1946 (71 y.o. F) Treating RN: Cornell Barman Primary Care Charon Smedberg: Leanna Battles Other Clinician: Referring Mykela Mewborn: Leanna Battles Treating Lonita Debes/Extender: Tito Dine in Treatment: 8 Vital Signs Height(in): 63 Pulse(bpm): 64 Weight(lbs): 120 Blood Pressure(mmHg): 161/88 Body Mass Index(BMI): 21 Temperature(F): 98.3 Respiratory Rate 16 (breaths/min): Photos: [1:No Photos] [N/A:N/A] Wound Location: [1:Coccyx] [N/A:N/A] Wounding Event: [1:Pressure Injury] [N/A:N/A] Primary Etiology: [1:Pressure Ulcer] [N/A:N/A] Comorbid History: [1:Cataracts, Hypertension, Type N/A II Diabetes, Osteoarthritis] Date Acquired: [1:10/20/2017] [N/A:N/A] Weeks of Treatment: [1:8] [N/A:N/A] Wound Status: [1:Open] [N/A:N/A] Measurements L x W x D [1:0.5x0.7x0.1] [N/A:N/A] (cm) Area (cm) : [1:0.275] [N/A:N/A] Volume  (cm) : [1:0.027] [N/A:N/A] % Reduction in Area: [1:95.90%] [N/A:N/A] % Reduction in Volume: [1:96.00%] [N/A:N/A] Classification: [1:Category/Stage III] [N/A:N/A] Exudate Amount: [1:Medium] [N/A:N/A] Exudate Type: [1:Serous] [N/A:N/A] Exudate Color: [1:amber] [N/A:N/A] Wound Margin: [1:Flat and Intact] [N/A:N/A] Granulation Amount: [1:None Present (0%)] [N/A:N/A] Necrotic Amount: [1:Large (67-100%)] [N/A:N/A] Necrotic Tissue: [1:Eschar] [N/A:N/A] Exposed Structures: [1:Fascia: No Fat Layer (Subcutaneous Tissue) Exposed: No Tendon:  No Muscle: No Joint: No Bone: No] [N/A:N/A] Epithelialization: [1:Small (1-33%)] [N/A:N/A] Debridement: [1:Debridement - Excisional] [N/A:N/A] Pre-procedure [1:11:57] [N/A:N/A] Verification/Time Out Taken: Pain Control: [1:Other] [N/A:N/A] Tissue Debrided: [1:Subcutaneous] [N/A:N/A] Level: [1:Skin/Subcutaneous Tissue] [N/A:N/A] Debridement Area (sq cm): 0.35 N/A N/A Instrument: Curette N/A N/A Bleeding: Minimum N/A N/A Hemostasis Achieved: Pressure N/A N/A Procedural Pain: 0 N/A N/A Post Procedural Pain: 0 N/A N/A Debridement Treatment Procedure was tolerated well N/A N/A Response: Post Debridement 0.5x0.7x0.1 N/A N/A Measurements L x W x D (cm) Post Debridement Volume: 0.027 N/A N/A (cm) Post Debridement Stage: Category/Stage III N/A N/A Periwound Skin Texture: Excoriation: No N/A N/A Induration: No Callus: No Crepitus: No Rash: No Scarring: No Periwound Skin Moisture: Maceration: Yes N/A N/A Dry/Scaly: No Periwound Skin Color: Atrophie Blanche: No N/A N/A Cyanosis: No Ecchymosis: No Erythema: No Hemosiderin Staining: No Mottled: No Pallor: No Rubor: No Temperature: No Abnormality N/A N/A Tenderness on Palpation: Yes N/A N/A Wound Preparation: Ulcer Cleansing: N/A N/A Rinsed/Irrigated with Saline Topical Anesthetic Applied: Other: lidocAINE 4% Procedures Performed: Debridement N/A N/A Treatment Notes Electronic  Signature(s) Signed: 02/25/2018 4:50:03 PM By: Linton Ham MD Entered By: Linton Ham on 02/25/2018 12:34:06 [name] ([id number]) -------------------------------------------------------------------------------- [place] Details Patient Name: [name]. Date of Service: 02/25/2018 11:15 AM Medical Record Number: [id number] Patient Account Number: [id number] Date of Birth/Sex: 02/16/1946 (72 y.o. F) Treating RN: Cornell Barman Primary Care Dondi Aime: Leanna Battles Other Clinician: Referring Saintclair Schroader: Leanna Battles Treating Macari Zalesky/Extender: Tito Dine in Treatment: 8 Active Inactive ` Abuse / Safety / Falls / Self Care Management Nursing Diagnoses: Impaired physical mobility Goals: Patient will not experience any injury related to falls Date Initiated: 12/31/2017 Target Resolution Date: 03/21/2018 Goal Status: Active Interventions: Assess fall risk on admission and as needed Notes: ` Orientation to the Wound Care Program Nursing Diagnoses: Knowledge deficit related to the wound healing center program Goals: Patient/caregiver will verbalize understanding of the [place] Date Initiated: 12/31/2017 Target Resolution Date: 03/21/2018 Goal Status: Active Interventions: Provide education on orientation to the wound center Notes: ` Wound/Skin Impairment Nursing Diagnoses: Impaired tissue integrity Goals: Ulcer/skin breakdown will heal within 14 weeks Date Initiated: 12/31/2017 Target Resolution Date: 01/17/2018 Goal Status: Active Interventions: KELINA, BEAUCHAMP ([id number]) Assess patient/caregiver ability to obtain necessary supplies Assess patient/caregiver ability to perform ulcer/skin care regimen upon admission and as needed Assess ulceration(s) every visit Notes: Electronic Signature(s) Signed: 02/25/2018 4:52:44 PM By: Gretta Cool, BSN, RN, CWS, Kim RN, BSN Entered By: Gretta Cool, BSN, RN, CWS, Kim on  02/25/2018 12:17:59 [name] ([id number]) -------------------------------------------------------------------------------- Pain Assessment Details Patient Name: [name]. Date of Service: 02/25/2018 11:15 AM Medical Record Number: [id number] Patient Account Number: [id number] Date of Birth/Sex: 1945/10/26 (72 y.o. F) Treating RN: Roger Shelter Primary Care Cleatus Gabriel: Leanna Battles Other Clinician: Referring Jaylend Reiland: Leanna Battles Treating Saloni Lablanc/Extender: Tito Dine in Treatment: 8 Active Problems Location of Pain Severity and Description of Pain Patient Has Paino No Site Locations Pain Management and Medication Current Pain Management: Electronic Signature(s) Signed: 02/25/2018 3:23:03 PM By: Roger Shelter Entered By: Roger Shelter on 02/25/2018 11:58:19 Langan, Jomarie Longs ([id number]) -------------------------------------------------------------------------------- Wound Assessment Details Patient Name: [name]. Date of Service: 02/25/2018 11:15 AM Medical Record Number: [id number] Patient Account Number: [id number] Date of Birth/Sex: 1946/09/19 (72 y.o. F) Treating RN: Roger Shelter Primary Care Nehal Witting: Leanna Battles Other Clinician: Referring Maxime Beckner: Leanna Battles Treating Aalyah Mansouri/Extender: Ricard Dillon Weeks in Treatment: 8 Wound Status Wound Number: 1 Primary Pressure Ulcer  Etiology: Wound Location: Coccyx Wound Status: Open Wounding Event: Pressure Injury Comorbid Cataracts, Hypertension, Type II Diabetes, Date Acquired: 10/20/2017 History: Osteoarthritis Weeks Of Treatment: 8 Clustered Wound: No Photos Photo Uploaded By: Roger Shelter on 02/25/2018 15:39:59 Wound Measurements Length: (cm) 0.5 Width: (cm) 0.7 Depth: (cm) 0.1 Area: (cm) 0.275 Volume: (cm) 0.027 % Reduction in Area: 95.9% % Reduction in Volume: 96% Epithelialization: Small (1-33%) Tunneling: No Undermining: No Wound  Description Classification: Category/Stage III Wound Margin: Flat and Intact Exudate Amount: Medium Exudate Type: Serous Exudate Color: amber Foul Odor After Cleansing: No Slough/Fibrino Yes Wound Bed Granulation Amount: None Present (0%) Exposed Structure Necrotic Amount: Large (67-100%) Fascia Exposed: No Necrotic Quality: Eschar Fat Layer (Subcutaneous Tissue) Exposed: No Tendon Exposed: No Muscle Exposed: No Joint Exposed: No Bone Exposed: No Periwound Skin Texture Raper, Deborah H. ([id number]) Texture Color No Abnormalities Noted: No No Abnormalities Noted: No Callus: No Atrophie Blanche: No Crepitus: No Cyanosis: No Excoriation: No Ecchymosis: No Induration: No Erythema: No Rash: No Hemosiderin Staining: No Scarring: No Mottled: No Pallor: No Moisture Rubor: No No Abnormalities Noted: No Dry / Scaly: No Temperature / Pain Maceration: Yes Temperature: No Abnormality Tenderness on Palpation: Yes Wound Preparation Ulcer Cleansing: Rinsed/Irrigated with Saline Topical Anesthetic Applied: Other: lidocAINE 4%, Electronic Signature(s) Signed: 02/25/2018 3:23:03 PM By: Roger Shelter Entered By: Roger Shelter on 02/25/2018 12:02:36 [name] ([id number]) -------------------------------------------------------------------------------- Vitals Details Patient Name: [name] Date of Service: 02/25/2018 11:15 AM Medical Record Number: [id number] Patient Account Number: [id number] Date of Birth/Sex: 06-Mar-1946 (72 y.o. F) Treating RN: Roger Shelter Primary Care Emalynn Clewis: Leanna Battles Other Clinician: Referring Seleena Reimers: Leanna Battles Treating Heran Campau/Extender: Tito Dine in Treatment: 8 Vital Signs Time Taken: 11:58 Temperature (F): 98.3 Height (in): 63 Pulse (bpm): 64 Weight (lbs): 120 Respiratory Rate (breaths/min): 16 Body Mass Index (BMI): 21.3 Blood Pressure (mmHg): 161/88 Reference Range: 80 - 120 mg /  dl Electronic Signature(s) Signed: 02/25/2018 3:23:03 PM By: Roger Shelter Entered By: Roger Shelter on 02/25/2018 11:59:51

## 2018-02-27 NOTE — Progress Notes (Signed)
NYJAH, SCHWAKE ([id number]) Visit Report for 02/25/2018 Debridement Details Patient Name: [name], INFANTINO. Date of Service: 02/25/2018 11:15 AM Medical Record Number: [id number] Patient Account Number: [id number] Date of Birth/Sex: Aug 07, 1946 (72 y.o. F) Treating RN: Cornell Barman Primary Care Provider: Leanna Battles Other Clinician: Referring Provider: Leanna Battles Treating Provider/Extender: Tito Dine in Treatment: 8 Debridement Performed for Wound #1 Coccyx Assessment: Performed By: Physician Ricard Dillon, MD Debridement Type: Debridement Pre-procedure Verification/Time Yes - 11:57 Out Taken: Start Time: 11:58 Pain Control: Other : lidocaine 4% Total Area Debrided (L x W): 0.5 (cm) x 0.7 (cm) = 0.35 (cm) Tissue and other material Viable, Non-Viable, Subcutaneous debrided: Level: Skin/Subcutaneous Tissue Debridement Description: Excisional Instrument: Curette Bleeding: Minimum Hemostasis Achieved: Pressure End Time: 12:02 Procedural Pain: 0 Post Procedural Pain: 0 Response to Treatment: Procedure was tolerated well Level of Consciousness: Awake and Alert Post Procedure Vitals: Temperature: 98.3 Pulse: 64 Respiratory Rate: 16 Blood Pressure: Systolic Blood Pressure: 211 Diastolic Blood Pressure: 88 Post Debridement Measurements of Total Wound Length: (cm) 0.5 Stage: Category/Stage III Width: (cm) 0.7 Depth: (cm) 0.1 Volume: (cm) 0.027 Character of Wound/Ulcer Post Stable Debridement: Post Procedure Diagnosis Same as Pre-procedure Electronic Signature(s) Signed: 02/25/2018 4:50:03 PM By: Linton Ham MD [name] ([id number]) Signed: 02/25/2018 4:52:44 PM By: Gretta Cool, BSN, RN, CWS, Kim RN, BSN Entered By: Linton Ham on 02/25/2018 12:35:27 [name] ([id number]) -------------------------------------------------------------------------------- HPI Details Patient Name: [name]. Date of Service: 02/25/2018  11:15 AM Medical Record Number: [id number] Patient Account Number: [id number] Date of Birth/Sex: Aug 24, 1946 (72 y.o. 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 She had a complicated postoperative course with respiratory failure, acute bilateral watershed cerebral infarcts, reintubation, tracheostomy, postoperative healing by secondary intention with NPWT, blood transfusion; she was treated with broad-spectrum antibiotic (meropenem, vancomycin, Zosyn, Unasyn) and antifungal (anidulafungin). She was also noted to have a sacral pressure ulcer during this admission, treated with Santyl. She was discharged to inpatient rehabilitation, then to SNF/rehab ([place]) with plans to discharge home this weekend. She's been experiencing multiple complications with ileostomy since arriving to [facility], staff at facility unable to maintain appliance with noted peristomal skin breakdown. We discussed at length ostomy care and skin related challenges with ileostomy effluent. Her daughter-in-law accompanies her today, with multiple questions answered. She presents with a stage 3 sacral/coccyx pressure ulcer. She is ambulatory and has a foam cushion in her wheelchair. The facility has been placing collagen per the daughter-in-law. 01/07/18; this is a patient who came in predominantly related to issues with a new ileostomy. Apparently changes were made and the ostomy is doing better. She is of course going to have a large  amount of ostomy output from an ileostomy and she seems aware of that. During the course of last week's visit she was incidentally discovered to have a pressure ulcer on the sacrum. Using silver alginate this is smaller 01/13/18; the patient has a pressure ulcer on her left gluteal area that was discovered incidentally after review for ileostomy issues. She is at [facility] skilled facility and she is apparently going home next Tuesday. We had  given orders for so over alginate and border foam to the pressure ulcer which is stage 3 however she had Xeroform on this today. 4//10/19; the patient has a pressure ulcer on her left gluteal in close proximity to her coccyx. This is smaller than her last visit. There is no surrounding infection. She actually did not leave the facility 2 weeks ago is average size but she is going home next Monday. oShe is having a lot of trouble with adherence of the ostomy bag. Ostomy effluent is draining onto her skin causing irritation. Our intake nurse called the ostomy nurse at the hospital to come over and have a look at this. 02/11/18;the patient returns for two-week follow-up. she is now back at home after being discharged from [facility]. The last time she was here she had ostomy problems as well as a stage II pressure ulcer on her coccyx. We had the ostomy nurse come over and she changed the ostomy seal. The patient returns today things have not gone well. She cannot maintain at obtain a seal. She has constant drainage. It is not clear to me that the nursing home made discharge arrangements for care of her pressure ulcer on her coccyx which is a stage II wound. I don't think anything is being done to this. 5/50/19; last time the patient was here we sent her to the ER with concern for cellulitis versus desquamative dermatitis involving the abdomen around her ileostomy. After reviewing the ER they did not feel that this was cellulitis. The had the ostomy nurse, and  give her a belt to hold the ostomy in place and things have actually been going better. Also concerning is that she had developed prerenal renal failure with a BUN of 28 and creatinine of 1.80. She was apparently given 2 L of IV fluid. Does not look as though she's had any follow-up labs. Her CO2 was 23, sodium at 122 potassium was 4.1. She has not been systemically unwell. She says she is eating and drinking normally. Her albumin was 3.7. White count was 13.7 although it looks as though her white count has been chronically high She has a small pressure area on the left buttock. This is smaller this time. We have been applying silver alginate but these orders haven't gotten through the home health apparently [facility] where not completely sure about this. The skin or abdomen looks a lot better Electronic Signature(s) Signed: 02/25/2018 4:50:03 PM By: Linton Ham MD Entered By: Linton Ham on 02/25/2018 12:39:56 CHANTERIA, HAGGARD ([id number]) SANDARA, TYREE ([id number]) -------------------------------------------------------------------------------- Physical Exam Details Patient Name: [name]. Date of Service: 02/25/2018 11:15 AM Medical Record Number: [id number] Patient Account Number: [id number] Date of Birth/Sex: 19-Jun-1946 (72 y.o. F) Treating RN: Cornell Barman Primary Care Provider: Leanna Battles Other Clinician: Referring Provider: Leanna Battles Treating Provider/Extender: Tito Dine in Treatment: 8 Constitutional Patient is hypertensive.. Pulse regular and within target range for patient.Marland Kitchen Respirations regular, non-labored and within target range.. Temperature is normal and within the target range for the patient.Marland Kitchen appears in no distress. Respiratory Respiratory effort is easy and symmetric bilaterally. Rate is normal at rest and on room air.. Bilateral breath sounds are clear and equal in all lobes with no wheezes, rales or  rhonchi.. Cardiovascular Heart rhythm and rate regular, without murmur or TruckOr.si volume contraction. Edema present in both extremities.this is mild. Integumentary (Hair, Skin) skin around the ileostomy on the abdomen is a lot less tender and a lot less angry-looking  but is not completely resolved. Psychiatric No evidence of depression, anxiety, or agitation. Calm, cooperative, and communicative. Appropriate interactions and affect.. Notes wound exam; the area over the sacrum is still open a small stage III wound on the left buttock just close to the sacrum. This is coming down nicely in size since she was first here. nonviable skin and eschar around the wound circumference removed with a #3 curet this looks a lot more healthy oThe erythema around her ostomy looks somewhat irritated however this is a lot better than 2 weeks ago. Not nearly as tender but still erythematous Electronic Signature(s) Signed: 02/25/2018 4:50:03 PM By: Linton Ham MD Entered By: Linton Ham on 02/25/2018 12:44:31 [name] ([id number]) -------------------------------------------------------------------------------- Physician Orders Details Patient Name: [name] Date of Service: 02/25/2018 11:15 AM Medical Record Number: [id number] Patient Account Number: [id number] Date of Birth/Sex: Jan 06, 1946 (72 y.o. F) Treating RN: Cornell Barman Primary Care Provider: Leanna Battles Other Clinician: Referring Provider: Leanna Battles Treating Provider/Extender: Tito Dine in Treatment: 8 Verbal / Phone Orders: No Diagnosis Coding Wound Cleansing Wound #1 Coccyx o Clean wound with Normal Saline. o May Shower, gently pat wound dry prior to applying new dressing. Anesthetic (add to Medication List) Wound #1 Coccyx o Topical Lidocaine 4% cream applied to wound bed prior to debridement (In Clinic Only). Primary Wound Dressing Wound #1 Coccyx o Silver Alginate -  Coccyx Secondary Dressing Wound #1 Coccyx o Boardered Foam Dressing - Coccyx Dressing Change Frequency Wound #1 Coccyx o Change dressing every other day. Follow-up Appointments Wound #1 Coccyx o Return Appointment in 2 weeks. Home Health Wound #1 [place] Visits - [facility] please call [place] with a fax number and the company name so orders can be faxed...[place] Nurse may visit PRN to address patientos wound care needs. o FACE TO FACE ENCOUNTER: MEDICARE and MEDICAID PATIENTS: I certify that this patient is under my care and that I had a face-to-face encounter that meets the physician face-to-face encounter requirements with this patient on this date. The encounter with the patient was in whole or in part for the following MEDICAL CONDITION: (primary reason for [place]) MEDICAL NECESSITY: I certify, that based on my findings, NURSING services are a medically necessary home health service. HOME BOUND STATUS: I certify that my clinical findings support that this patient is homebound (i.e., Due to illness or injury, pt requires aid of supportive devices such as crutches, cane, wheelchairs, walkers, the use of special transportation or the assistance of another person to leave their place of residence. There is a normal inability to leave the home and doing so requires considerable and taxing effort. Other absences are for medical reasons / religious services and are infrequent or of short duration when for other reasons). o If current dressing causes regression in wound condition, may D/C ordered dressing product/s and apply Normal Saline Moist Dressing daily until next [place] / Other MD appointment. [place] of regression in wound condition at [phone]. SHALI, VESEY ([id number]) o Please direct any NON-WOUND related issues/requests for orders to patient's Primary Care  Physician Electronic Signature(s) Signed: 02/25/2018 4:50:03 PM By: Linton Ham MD Signed: 02/25/2018 4:52:44 PM By: Gretta Cool, BSN, RN, CWS, Kim RN, BSN Entered By: Gretta Cool, BSN, RN, CWS, Kim on 02/25/2018 12:19:35 [name] ([id number]) -------------------------------------------------------------------------------- Problem List Details Patient Name: [name] Date of Service: 02/25/2018 11:15 AM Medical Record Number:  [id number] Patient Account Number: [id number] Date of Birth/Sex: Aug 31, 1946 (72 y.o. F) Treating RN: Cornell Barman Primary Care Provider: Leanna Battles Other Clinician: Referring Provider: Leanna Battles Treating Provider/Extender: Tito Dine in Treatment: 8 Active Problems ICD-10 Impacting Encounter Code Description Active Date Wound Healing Diagnosis L89.153 Pressure ulcer of sacral region, stage 3 12/31/2017 Yes Z93.2 Ileostomy status 12/31/2017 Yes R54 Age-related physical debility 12/31/2017 Yes S30.92XS Unspecified superficial injury of abdominal wall, sequela 12/31/2017 Yes Inactive Problems Resolved Problems Electronic Signature(s) Signed: 02/25/2018 4:50:03 PM By: Linton Ham MD Entered By: Linton Ham on 02/25/2018 12:33:57 [name] ([id number]) -------------------------------------------------------------------------------- Progress Note Details Patient Name: [name]. Date of Service: 02/25/2018 11:15 AM Medical Record Number: [id number] Patient Account Number: [id number] Date of Birth/Sex: [birth year] (72 y.o. 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 She had a complicated postoperative course with respiratory failure, acute bilateral watershed cerebral infarcts, reintubation, tracheostomy, postoperative healing by secondary intention with NPWT, blood transfusion; she was treated with broad-spectrum antibiotic (meropenem, vancomycin, Zosyn, Unasyn) and antifungal (anidulafungin). She was also noted to have a sacral pressure ulcer during this admission, treated with Santyl. She was discharged to inpatient rehabilitation, then to SNF/rehab ([place]) with plans to discharge home this weekend. She's been experiencing multiple complications with ileostomy since arriving to [facility], staff at facility unable to maintain appliance with noted peristomal skin breakdown. We discussed at length ostomy care and skin related challenges with ileostomy effluent. Her daughter-in-law accompanies her today, with multiple questions answered. She presents with a stage 3 sacral/coccyx pressure ulcer. She is ambulatory and has a foam cushion in her wheelchair. The facility has been placing collagen per the daughter-in-law. 01/07/18; this is a patient who came in predominantly related to issues with a new ileostomy. Apparently changes were made and the ostomy is doing better. She is of course going to have a large amount of ostomy output from an ileostomy and she seems aware of that. During the course of last week's visit she was incidentally discovered to have a pressure ulcer on the sacrum. Using silver alginate this is smaller 01/13/18; the patient has a pressure ulcer on her left gluteal area that was discovered incidentally after review for ileostomy issues. She is at [facility] skilled facility and she is apparently going home next Tuesday. We had given orders for so over alginate and border foam to the pressure ulcer which  is stage 3 however she had Xeroform on this today. 4//10/19; the patient has a pressure ulcer on her left gluteal in close proximity to her coccyx. This is smaller than her last visit. There is no surrounding infection. She actually did not leave the facility 2 weeks ago is average size but she is going home next Monday. She is having a lot of trouble with adherence of the ostomy bag. Ostomy effluent is draining onto her skin causing irritation. Our intake nurse called the ostomy nurse at the hospital to come over and have a look at this. 02/11/18;the patient returns for two-week follow-up. she is now back at home after being discharged from [facility]. The last time she  was here she had ostomy problems as well as a stage II pressure ulcer on her coccyx. We had the ostomy nurse come over and she changed the ostomy seal. The patient returns today things have not gone well. She cannot maintain at obtain a seal. She has constant drainage. It is not clear to me that the nursing home made discharge arrangements for care of her pressure ulcer on her coccyx which is a stage II wound. I don't think anything is being done to this. 5/50/19; last time the patient was here we sent her to the ER with concern for cellulitis versus desquamative dermatitis involving the abdomen around her ileostomy. After reviewing the ER they did not feel that this was cellulitis. The had the ostomy nurse, and give her a belt to hold the ostomy in place and things have actually been going better. Also concerning is that she had developed prerenal renal failure with a BUN of 28 and creatinine of 1.80. She was apparently given 2 L of IV fluid. Does not look as though she's had any follow-up labs. Her CO2 was 23, sodium at 122 potassium was 4.1. She has not been systemically unwell. She says she is eating and drinking normally. Her albumin was 3.7. White count was 13.7 although it looks as though her white count has been chronically  high She has a small pressure area on the left buttock. This is smaller this time. We have been applying silver alginate but these orders haven't gotten through the home health apparently [facility] where not completely sure about this. The skin or abdomen looks a lot better Currie, Perri H. ([id number]) Objective Constitutional Patient is hypertensive.. Pulse regular and within target range for patient.Marland Kitchen Respirations regular, non-labored and within target range.. Temperature is normal and within the target range for the patient.Marland Kitchen appears in no distress. Vitals Time Taken: 11:58 AM, Height: 63 in, Weight: 120 lbs, BMI: 21.3, Temperature: 98.3 F, Pulse: 64 bpm, Respiratory Rate: 16 breaths/min, Blood Pressure: 161/88 mmHg. Respiratory Respiratory effort is easy and symmetric bilaterally. Rate is normal at rest and on room air.. Bilateral breath sounds are clear and equal in all lobes with no wheezes, rales or rhonchi.. Cardiovascular Heart rhythm and rate regular, without murmur or TruckOr.si volume contraction. Edema present in both extremities.this is mild. Psychiatric No evidence of depression, anxiety, or agitation. Calm, cooperative, and communicative. Appropriate interactions and affect.. General Notes: wound exam; the area over the sacrum is still open a small stage III wound on the left buttock just close to the sacrum. This is coming down nicely in size since she was first here. nonviable skin and eschar around the wound circumference removed with a #3 curet this looks a lot more healthy The erythema around her ostomy looks somewhat irritated however this is a lot better than 2 weeks ago. Not nearly as tender but still erythematous Integumentary (Hair, Skin) skin around the ileostomy on the abdomen is a lot less tender and a lot less angry-looking but is not completely resolved. Wound #1 status is Open. Original cause of wound was Pressure Injury. The wound is located on the  Coccyx. The wound measures 0.5cm length x 0.7cm width x 0.1cm depth; 0.275cm^2 area and 0.027cm^3 volume. There is no tunneling or undermining noted. There is a medium amount of serous drainage noted. The wound margin is flat and intact. There is no granulation within the wound bed. There is a large (67-100%) amount of necrotic tissue within the wound bed including  Eschar. The periwound skin appearance exhibited: Maceration. The periwound skin appearance did not exhibit: Callus, Crepitus, Excoriation, Induration, Rash, Scarring, Dry/Scaly, Atrophie Blanche, Cyanosis, Ecchymosis, Hemosiderin Staining, Mottled, Pallor, Rubor, Erythema. Periwound temperature was noted as No Abnormality. The periwound has tenderness on palpation. Assessment Active Problems ICD-10 L89.153 - Pressure ulcer of sacral region, stage 3 Z93.2 - Ileostomy status R54 - Age-related physical debility S30.92XS - Unspecified superficial injury of abdominal wall, sequela Krasinski, Clothilde H. ([id number]) Procedures Wound #1 Pre-procedure diagnosis of Wound #1 is a Pressure Ulcer located on the Coccyx . There was a Excisional Skin/Subcutaneous Tissue Debridement with a total area of 0.35 sq cm performed by Ricard Dillon, MD. With the following instrument(s): Curette to remove Viable and Non-Viable tissue/material. Material removed includes Subcutaneous Tissue after achieving pain control using Other (lidocaine 4%). No specimens were taken. A time out was conducted at 11:57, prior to the start of the procedure. A Minimum amount of bleeding was controlled with Pressure. The procedure was tolerated well with a pain level of 0 throughout and a pain level of 0 following the procedure. Patient s Level of Consciousness post procedure was recorded as Awake and Alert and post-procedure vitals were taken including Temperature: 98.3 F, Pulse: 64 bpm, Respiratory Rate: 16 breaths/min, Blood Pressure: (161)/(88) mmHg. Post Debridement  Measurements: 0.5cm length x 0.7cm width x 0.1cm depth; 0.027cm^3 volume. Post debridement Stage noted as Category/Stage III. Character of Wound/Ulcer Post Debridement is stable. Post procedure Diagnosis Wound #1: Same as Pre-Procedure Plan Wound Cleansing: Wound #1 Coccyx: Clean wound with Normal Saline. May Shower, gently pat wound dry prior to applying new dressing. Anesthetic (add to Medication List): Wound #1 Coccyx: Topical Lidocaine 4% cream applied to wound bed prior to debridement (In Clinic Only). Primary Wound Dressing: Wound #1 Coccyx: Silver Alginate - Coccyx Secondary Dressing: Wound #1 Coccyx: Boardered Foam Dressing - Coccyx Dressing Change Frequency: Wound #1 Coccyx: Change dressing every other day. Follow-up Appointments: Wound #1 Coccyx: Return Appointment in 2 weeks. Home Health: Wound #1 Coccyx: Continue Home Health Visits - [facility] please call [place] with a fax number and the company name so orders can be faxed...[place] may visit PRN to address patient s wound care needs. FACE TO FACE ENCOUNTER: MEDICARE and MEDICAID PATIENTS: I certify that this patient is under my care and that I had a face-to-face encounter that meets the physician face-to-face encounter requirements with this patient on this date. The encounter with the patient was in whole or in part for the following MEDICAL CONDITION: (primary reason for [place]) MEDICAL NECESSITY: I certify, that based on my findings, NURSING services are a medically necessary home health service. HOME BOUND STATUS: I certify that my clinical findings support that this patient is homebound (i.e., Due to illness or injury, pt requires aid of supportive devices such as crutches, cane, wheelchairs, walkers, the use of special transportation or the assistance of another person to leave their place of residence. There is a normal inability to leave the home and doing so  requires considerable and taxing effort. Other absences are for medical reasons / religious services and are infrequent or of short duration when for other reasons). BRYELLA, DIVINEY ([id number]) If current dressing causes regression in wound condition, may D/C ordered dressing product/s and apply Normal Saline Moist Dressing daily until next [place] / Other MD appointment. [place] of regression in wound condition at [phone]. Please direct any NON-WOUND  related issues/requests for orders to patient's Primary Care Physician #1 fortunately her skin around her ostomy looks better today. She is applying Desitin powder. She has an ostomy belt to hold the ostomy in place and there is a lot less leakage #2 stage III wound on her left buttock just in close proximity to her coccyx required debridement with a #3 curet removing skin and subcutaneous tissue from around the wound. This looks a lot better Electronic Signature(s) Signed: 02/25/2018 4:50:03 PM By: Linton Ham MD Entered By: Linton Ham on 02/25/2018 12:45:31 [name] ([id number]) -------------------------------------------------------------------------------- SuperBill Details Patient Name: [name] Date of Service: 02/25/2018 Medical Record Number: [id number] Patient Account Number: [id number] Date of Birth/Sex: 1946/09/30 (72 y.o. 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 R54 Age-related physical debility S30.92XS Unspecified superficial injury of abdominal wall, sequela Facility Procedures CPT4 Code: 59276394 Description: [phone] - WOUND CARE VISIT-LEV 2 EST PT Modifier: Quantity: 1 CPT4 Code:  [id number] Description: 01222 - DEB SUBQ TISSUE 20 SQ CM/< ICD-10 Diagnosis Description L89.153 Pressure ulcer of sacral region, stage 3 Modifier: Quantity: 1 Physician Procedures CPT4 Code: 4114643 Description: 14276 - WC PHYS SUBQ TISS 20 SQ CM ICD-10 Diagnosis Description L89.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

## 2018-03-02 DIAGNOSIS — D508 Other iron deficiency anemias: Secondary | ICD-10-CM | POA: Diagnosis not present

## 2018-03-02 DIAGNOSIS — E860 Dehydration: Secondary | ICD-10-CM | POA: Diagnosis not present

## 2018-03-02 DIAGNOSIS — R000 Tachycardia, unspecified: Secondary | ICD-10-CM | POA: Diagnosis not present

## 2018-03-02 DIAGNOSIS — R600 Localized edema: Secondary | ICD-10-CM | POA: Insufficient documentation

## 2018-03-02 DIAGNOSIS — K8681 Exocrine pancreatic insufficiency: Secondary | ICD-10-CM | POA: Diagnosis not present

## 2018-03-02 DIAGNOSIS — Z0289 Encounter for other administrative examinations: Secondary | ICD-10-CM | POA: Diagnosis not present

## 2018-03-02 DIAGNOSIS — Z932 Ileostomy status: Secondary | ICD-10-CM | POA: Diagnosis not present

## 2018-03-02 DIAGNOSIS — Z6826 Body mass index (BMI) 26.0-26.9, adult: Secondary | ICD-10-CM | POA: Diagnosis not present

## 2018-03-02 DIAGNOSIS — E1151 Type 2 diabetes mellitus with diabetic peripheral angiopathy without gangrene: Secondary | ICD-10-CM | POA: Diagnosis not present

## 2018-03-02 DIAGNOSIS — N184 Chronic kidney disease, stage 4 (severe): Secondary | ICD-10-CM | POA: Diagnosis not present

## 2018-03-03 DIAGNOSIS — J9611 Chronic respiratory failure with hypoxia: Secondary | ICD-10-CM | POA: Diagnosis not present

## 2018-03-03 DIAGNOSIS — J449 Chronic obstructive pulmonary disease, unspecified: Secondary | ICD-10-CM | POA: Diagnosis not present

## 2018-03-03 DIAGNOSIS — E1151 Type 2 diabetes mellitus with diabetic peripheral angiopathy without gangrene: Secondary | ICD-10-CM | POA: Diagnosis not present

## 2018-03-03 DIAGNOSIS — Z432 Encounter for attention to ileostomy: Secondary | ICD-10-CM | POA: Diagnosis not present

## 2018-03-03 DIAGNOSIS — I10 Essential (primary) hypertension: Secondary | ICD-10-CM | POA: Diagnosis not present

## 2018-03-03 DIAGNOSIS — I69354 Hemiplegia and hemiparesis following cerebral infarction affecting left non-dominant side: Secondary | ICD-10-CM | POA: Diagnosis not present

## 2018-03-05 DIAGNOSIS — R600 Localized edema: Secondary | ICD-10-CM | POA: Diagnosis not present

## 2018-03-05 DIAGNOSIS — I2510 Atherosclerotic heart disease of native coronary artery without angina pectoris: Secondary | ICD-10-CM | POA: Diagnosis not present

## 2018-03-05 DIAGNOSIS — F329 Major depressive disorder, single episode, unspecified: Secondary | ICD-10-CM | POA: Diagnosis not present

## 2018-03-05 DIAGNOSIS — Z6826 Body mass index (BMI) 26.0-26.9, adult: Secondary | ICD-10-CM | POA: Diagnosis not present

## 2018-03-05 DIAGNOSIS — D509 Iron deficiency anemia, unspecified: Secondary | ICD-10-CM | POA: Diagnosis not present

## 2018-03-05 DIAGNOSIS — N184 Chronic kidney disease, stage 4 (severe): Secondary | ICD-10-CM | POA: Diagnosis not present

## 2018-03-05 DIAGNOSIS — R000 Tachycardia, unspecified: Secondary | ICD-10-CM | POA: Diagnosis not present

## 2018-03-05 DIAGNOSIS — Z932 Ileostomy status: Secondary | ICD-10-CM | POA: Diagnosis not present

## 2018-03-05 DIAGNOSIS — E860 Dehydration: Secondary | ICD-10-CM | POA: Diagnosis not present

## 2018-03-05 DIAGNOSIS — Z7689 Persons encountering health services in other specified circumstances: Secondary | ICD-10-CM | POA: Diagnosis not present

## 2018-03-10 DIAGNOSIS — L905 Scar conditions and fibrosis of skin: Secondary | ICD-10-CM | POA: Diagnosis not present

## 2018-03-11 ENCOUNTER — Ambulatory Visit: Payer: Medicaid Other | Admitting: Internal Medicine

## 2018-03-12 DIAGNOSIS — N184 Chronic kidney disease, stage 4 (severe): Secondary | ICD-10-CM | POA: Diagnosis not present

## 2018-03-12 DIAGNOSIS — M545 Low back pain: Secondary | ICD-10-CM | POA: Diagnosis not present

## 2018-03-12 DIAGNOSIS — Z932 Ileostomy status: Secondary | ICD-10-CM | POA: Diagnosis not present

## 2018-03-12 DIAGNOSIS — Z6826 Body mass index (BMI) 26.0-26.9, adult: Secondary | ICD-10-CM | POA: Diagnosis not present

## 2018-03-12 DIAGNOSIS — K8689 Other specified diseases of pancreas: Secondary | ICD-10-CM | POA: Diagnosis not present

## 2018-03-12 DIAGNOSIS — R600 Localized edema: Secondary | ICD-10-CM | POA: Diagnosis not present

## 2018-03-12 DIAGNOSIS — D508 Other iron deficiency anemias: Secondary | ICD-10-CM | POA: Diagnosis not present

## 2018-03-12 DIAGNOSIS — L988 Other specified disorders of the skin and subcutaneous tissue: Secondary | ICD-10-CM | POA: Diagnosis not present

## 2018-03-12 DIAGNOSIS — I10 Essential (primary) hypertension: Secondary | ICD-10-CM | POA: Diagnosis not present

## 2018-03-12 DIAGNOSIS — R000 Tachycardia, unspecified: Secondary | ICD-10-CM | POA: Diagnosis not present

## 2018-03-12 DIAGNOSIS — F3289 Other specified depressive episodes: Secondary | ICD-10-CM | POA: Diagnosis not present

## 2018-03-13 DIAGNOSIS — I10 Essential (primary) hypertension: Secondary | ICD-10-CM | POA: Diagnosis not present

## 2018-03-13 DIAGNOSIS — E1151 Type 2 diabetes mellitus with diabetic peripheral angiopathy without gangrene: Secondary | ICD-10-CM | POA: Diagnosis not present

## 2018-03-13 DIAGNOSIS — Z432 Encounter for attention to ileostomy: Secondary | ICD-10-CM | POA: Diagnosis not present

## 2018-03-13 DIAGNOSIS — J449 Chronic obstructive pulmonary disease, unspecified: Secondary | ICD-10-CM | POA: Diagnosis not present

## 2018-03-13 DIAGNOSIS — I69354 Hemiplegia and hemiparesis following cerebral infarction affecting left non-dominant side: Secondary | ICD-10-CM | POA: Diagnosis not present

## 2018-03-13 DIAGNOSIS — J9611 Chronic respiratory failure with hypoxia: Secondary | ICD-10-CM | POA: Diagnosis not present

## 2018-03-17 DIAGNOSIS — R300 Dysuria: Secondary | ICD-10-CM | POA: Diagnosis not present

## 2018-03-17 DIAGNOSIS — N390 Urinary tract infection, site not specified: Secondary | ICD-10-CM | POA: Diagnosis not present

## 2018-03-18 ENCOUNTER — Encounter: Payer: Medicare Other | Attending: Internal Medicine | Admitting: Internal Medicine

## 2018-03-18 DIAGNOSIS — L89159 Pressure ulcer of sacral region, unspecified stage: Secondary | ICD-10-CM | POA: Diagnosis not present

## 2018-03-18 DIAGNOSIS — E119 Type 2 diabetes mellitus without complications: Secondary | ICD-10-CM | POA: Diagnosis not present

## 2018-03-18 DIAGNOSIS — I10 Essential (primary) hypertension: Secondary | ICD-10-CM | POA: Diagnosis not present

## 2018-03-18 DIAGNOSIS — Z932 Ileostomy status: Secondary | ICD-10-CM | POA: Insufficient documentation

## 2018-03-18 DIAGNOSIS — L89153 Pressure ulcer of sacral region, stage 3: Secondary | ICD-10-CM | POA: Diagnosis not present

## 2018-03-18 DIAGNOSIS — X58XXXA Exposure to other specified factors, initial encounter: Secondary | ICD-10-CM | POA: Insufficient documentation

## 2018-03-18 DIAGNOSIS — S3991XA Unspecified injury of abdomen, initial encounter: Secondary | ICD-10-CM | POA: Insufficient documentation

## 2018-03-18 DIAGNOSIS — Z9049 Acquired absence of other specified parts of digestive tract: Secondary | ICD-10-CM | POA: Insufficient documentation

## 2018-03-19 DIAGNOSIS — J9611 Chronic respiratory failure with hypoxia: Secondary | ICD-10-CM | POA: Diagnosis not present

## 2018-03-19 DIAGNOSIS — I69354 Hemiplegia and hemiparesis following cerebral infarction affecting left non-dominant side: Secondary | ICD-10-CM | POA: Diagnosis not present

## 2018-03-19 DIAGNOSIS — Z432 Encounter for attention to ileostomy: Secondary | ICD-10-CM | POA: Diagnosis not present

## 2018-03-19 DIAGNOSIS — J449 Chronic obstructive pulmonary disease, unspecified: Secondary | ICD-10-CM | POA: Diagnosis not present

## 2018-03-19 DIAGNOSIS — I10 Essential (primary) hypertension: Secondary | ICD-10-CM | POA: Diagnosis not present

## 2018-03-19 DIAGNOSIS — E1151 Type 2 diabetes mellitus with diabetic peripheral angiopathy without gangrene: Secondary | ICD-10-CM | POA: Diagnosis not present

## 2018-03-20 NOTE — Progress Notes (Signed)
ZORIA, RAWLINSON ([id number]) Visit Report for 03/18/2018 Arrival Information Details Patient Name: [name], LASKY. Date of Service: 03/18/2018 1:00 PM Medical Record Number: [id number] Patient Account Number: [id number] Date of Birth/Sex: Jul 25, 1946 (72 y.o. F) Treating RN: Roger Shelter Primary Care Kysean Sweet: Leanna Battles Other Clinician: Referring Leonora Gores: Leanna Battles Treating Avaleen Brownley/Extender: Tito Dine in Treatment: 11 Visit Information History Since Last Visit All ordered tests and consults were completed: No Patient Arrived: Ambulatory Added or deleted any medications: No Arrival Time: 12:50 Any new allergies or adverse reactions: No Accompanied By: self Had a fall or experienced change in No Transfer Assistance: None activities of daily living that may affect Patient Identification Verified: Yes risk of falls: Secondary Verification Process Completed: Yes Signs or symptoms of abuse/neglect since last visito No Patient Requires Transmission-Based No Hospitalized since last visit: No Precautions: Implantable device outside of the clinic excluding No Patient Has Alerts: Yes cellular tissue based products placed in the center Patient Alerts: DMII since last visit: Pain Present Now: No Electronic Signature(s) Signed: 03/18/2018 5:08:02 PM By: Roger Shelter Entered By: Roger Shelter on 03/18/2018 12:51:19 [name] ([id number]) -------------------------------------------------------------------------------- Clinic Level of Care Assessment Details Patient Name: [name], [name]. Date of Service: 03/18/2018 1:00 PM Medical Record Number: [id number] Patient Account Number: [id number] Date of Birth/Sex: 1946/07/24 (72 y.o. F) Treating RN: Cornell Barman Primary Care Dacey Milberger: Leanna Battles Other Clinician: Referring Yaa Donnellan: Leanna Battles Treating Malikah Lakey/Extender: Tito Dine in Treatment: 11 Clinic Level of Care Assessment  Items TOOL 4 Quantity Score []  - Use when only an EandM is performed on FOLLOW-UP visit 0 ASSESSMENTS - Nursing Assessment / Reassessment []  - Reassessment of Co-morbidities (includes updates in patient status) 0 X- 1 5 Reassessment of Adherence to Treatment Plan ASSESSMENTS - Wound and Skin Assessment / Reassessment X - Simple Wound Assessment / Reassessment - one wound 1 5 []  - 0 Complex Wound Assessment / Reassessment - multiple wounds []  - 0 Dermatologic / Skin Assessment (not related to wound area) ASSESSMENTS - Focused Assessment []  - Circumferential Edema Measurements - multi extremities 0 []  - 0 Nutritional Assessment / Counseling / Intervention []  - 0 Lower Extremity Assessment (monofilament, tuning fork, pulses) []  - 0 Peripheral Arterial Disease Assessment (using hand held doppler) ASSESSMENTS - Ostomy and/or Continence Assessment and Care []  - Incontinence Assessment and Management 0 []  - 0 Ostomy Care Assessment and Management (repouching, etc.) PROCESS - Coordination of Care X - Simple Patient / Family Education for ongoing care 1 15 []  - 0 Complex (extensive) Patient / Family Education for ongoing care []  - 0 Staff obtains Programmer, systems, Records, Test Results / Process Orders []  - 0 Staff telephones HHA, Nursing Homes / Clarify orders / etc []  - 0 Routine Transfer to another Facility (non-emergent condition) []  - 0 Routine Hospital Admission (non-emergent condition) []  - 0 New Admissions / Biomedical engineer / Ordering NPWT, Apligraf, etc. []  - 0 Emergency Hospital Admission (emergent condition) X- 1 10 Simple Discharge Coordination SHAUNTEE, KARP. ([id number]) []  - 0 Complex (extensive) Discharge Coordination PROCESS - Special Needs []  - Pediatric / Minor Patient Management 0 []  - 0 Isolation Patient Management []  - 0 Hearing / Language / Visual special needs []  - 0 Assessment of Community assistance (transportation, D/C planning, etc.) []  -  0 Additional assistance / Altered mentation []  - 0 Support Surface(s) Assessment (bed, cushion, seat, etc.) INTERVENTIONS - Wound Cleansing / Measurement X - Simple Wound Cleansing - one wound 1 5 []  -  0 Complex Wound Cleansing - multiple wounds X- 1 5 Wound Imaging (photographs - any number of wounds) []  - 0 Wound Tracing (instead of photographs) X- 1 5 Simple Wound Measurement - one wound []  - 0 Complex Wound Measurement - multiple wounds INTERVENTIONS - Wound Dressings X - Small Wound Dressing one or multiple wounds 1 10 []  - 0 Medium Wound Dressing one or multiple wounds []  - 0 Large Wound Dressing one or multiple wounds []  - 0 Application of Medications - topical []  - 0 Application of Medications - injection INTERVENTIONS - Miscellaneous []  - External ear exam 0 []  - 0 Specimen Collection (cultures, biopsies, blood, body fluids, etc.) []  - 0 Specimen(s) / Culture(s) sent or taken to Lab for analysis []  - 0 Patient Transfer (multiple staff / Civil Service fast streamer / Similar devices) []  - 0 Simple Staple / Suture removal (25 or less) []  - 0 Complex Staple / Suture removal (26 or more) []  - 0 Hypo / Hyperglycemic Management (close monitor of Blood Glucose) []  - 0 Ankle / Brachial Index (ABI) - do not check if billed separately X- 1 5 Vital Signs Licea, Dannika H. ([id number]) Has the patient been seen at the hospital within the last three years: Yes Total Score: 65 Level Of Care: New/Established - Level 2 Electronic Signature(s) Signed: 03/19/2018 2:15:42 PM By: Gretta Cool, BSN, RN, CWS, Kim RN, BSN Entered By: Gretta Cool, BSN, RN, CWS, Kim on 03/18/2018 13:29:16 [name] ([id number]) -------------------------------------------------------------------------------- Encounter Discharge Information Details Patient Name: [name]. Date of Service: 03/18/2018 1:00 PM Medical Record Number: [id number] Patient Account Number: [id number] Date of Birth/Sex: 10-29-1945 (72 y.o.  F) Treating RN: Montey Hora Primary Care Ganesh Deeg: Leanna Battles Other Clinician: Referring Ginia Rudell: Leanna Battles Treating Brisia Schuermann/Extender: Tito Dine in Treatment: 11 Encounter Discharge Information Items Discharge Condition: Stable Ambulatory Status: Walker Discharge Destination: Home Transportation: Private Auto Accompanied By: self Schedule Follow-up Appointment: Yes Clinical Summary of Care: Electronic Signature(s) Signed: 03/18/2018 3:29:03 PM By: Montey Hora Entered By: Montey Hora on 03/18/2018 15:29:02 [name] ([id number]) -------------------------------------------------------------------------------- Lower Extremity Assessment Details Patient Name: [name]. Date of Service: 03/18/2018 1:00 PM Medical Record Number: [id number] Patient Account Number: [id number] Date of Birth/Sex: 1946-08-08 (72 y.o. F) Treating RN: Roger Shelter Primary Care Dayrin Stallone: Leanna Battles Other Clinician: Referring Lurlene Ronda: Leanna Battles Treating Nel Stoneking/Extender: Ricard Dillon Weeks in Treatment: 11 Electronic Signature(s) Signed: 03/18/2018 5:08:02 PM By: Roger Shelter Entered By: Roger Shelter on 03/18/2018 12:58:56 [name] ([id number]) -------------------------------------------------------------------------------- Multi Wound Chart Details Patient Name: [name]. Date of Service: 03/18/2018 1:00 PM Medical Record Number: [id number] Patient Account Number: [id number] Date of Birth/Sex: 08-29-1946 (72 y.o. F) Treating RN: Cornell Barman Primary Care Haider Hornaday: Leanna Battles Other Clinician: Referring Ahmere Hemenway: Leanna Battles Treating Vonda Harth/Extender: Tito Dine in Treatment: 11 Vital Signs Height(in): 63 Pulse(bpm): 23 Weight(lbs): 120 Blood Pressure(mmHg): 160/78 Body Mass Index(BMI): 21 Temperature(F): 98.4 Respiratory Rate 16 (breaths/min): Photos: [1:No Photos] [N/A:N/A] Wound  Location: [1:Coccyx] [N/A:N/A] Wounding Event: [1:Pressure Injury] [N/A:N/A] Primary Etiology: [1:Pressure Ulcer] [N/A:N/A] Comorbid History: [1:Cataracts, Hypertension, Type N/A II Diabetes, Osteoarthritis] Date Acquired: [1:10/20/2017] [N/A:N/A] Weeks of Treatment: [1:11] [N/A:N/A] Wound Status: [1:Open] [N/A:N/A] Measurements L x W x D [1:0.5x0.5x0.1] [N/A:N/A] (cm) Area (cm) : [1:0.196] [N/A:N/A] Volume (cm) : [1:0.02] [N/A:N/A] % Reduction in Area: [1:97.10%] [N/A:N/A] % Reduction in Volume: [1:97.10%] [N/A:N/A] Classification: [1:Category/Stage III] [N/A:N/A] Exudate Amount: [1:Medium] [N/A:N/A] Exudate Type: [1:Serous] [N/A:N/A] Exudate Color: [1:amber] [N/A:N/A] Wound Margin: [1:Flat and Intact] [N/A:N/A] Granulation  Amount: [1:None Present (0%)] [N/A:N/A] Necrotic Amount: [1:Large (67-100%)] [N/A:N/A] Necrotic Tissue: [1:Eschar] [N/A:N/A] Exposed Structures: [1:Fascia: No Fat Layer (Subcutaneous Tissue) Exposed: No Tendon: No Muscle: No Joint: No Bone: No] [N/A:N/A] Epithelialization: [1:Medium (34-66%)] [N/A:N/A] Periwound Skin Texture: [1:Excoriation: No Induration: No Callus: No Crepitus: No Rash: No Scarring: No] [N/A:N/A] Periwound Skin Moisture: Maceration: Yes N/A N/A Dry/Scaly: No Periwound Skin Color: Atrophie Blanche: No N/A N/A Cyanosis: No Ecchymosis: No Erythema: No Hemosiderin Staining: No Mottled: No Pallor: No Rubor: No Temperature: No Abnormality N/A N/A Tenderness on Palpation: Yes N/A N/A Wound Preparation: Ulcer Cleansing: N/A N/A Rinsed/Irrigated with Saline Topical Anesthetic Applied: Other: lidocAINE 4% Treatment Notes Electronic Signature(s) Signed: 03/18/2018 6:13:14 PM By: Linton Ham MD Entered By: Linton Ham on 03/18/2018 14:45:40 [name] ([id number]) -------------------------------------------------------------------------------- [place] Details Patient Name: [name] Date of  Service: 03/18/2018 1:00 PM Medical Record Number: [id number] Patient Account Number: [id number] Date of Birth/Sex: [birth year] (72 y.o. F) Treating RN: Cornell Barman Primary Care Abas Leicht: Leanna Battles Other Clinician: Referring Joscelyne Renville: Leanna Battles Treating Eshaan Titzer/Extender: Tito Dine in Treatment: 11 Active Inactive ` Abuse / Safety / Falls / Self Care Management Nursing Diagnoses: Impaired physical mobility Goals: Patient will not experience any injury related to falls Date Initiated: 12/31/2017 Target Resolution Date: 03/21/2018 Goal Status: Active Interventions: Assess fall risk on admission and as needed Notes: ` Orientation to the Wound Care Program Nursing Diagnoses: Knowledge deficit related to the wound healing center program Goals: Patient/caregiver will verbalize understanding of the [place] Date Initiated: 12/31/2017 Target Resolution Date: 03/21/2018 Goal Status: Active Interventions: Provide education on orientation to the wound center Notes: ` Wound/Skin Impairment Nursing Diagnoses: Impaired tissue integrity Goals: Ulcer/skin breakdown will heal within 14 weeks Date Initiated: 12/31/2017 Target Resolution Date: 01/17/2018 Goal Status: Active Interventions: VARNELL, DONATE ([id number]) Assess patient/caregiver ability to obtain necessary supplies Assess patient/caregiver ability to perform ulcer/skin care regimen upon admission and as needed Assess ulceration(s) every visit Notes: Electronic Signature(s) Signed: 03/19/2018 2:15:42 PM By: Gretta Cool, BSN, RN, CWS, Kim RN, BSN Entered By: Gretta Cool, BSN, RN, CWS, Kim on 03/18/2018 13:28:08 [name] ([id number]) -------------------------------------------------------------------------------- Pain Assessment Details Patient Name: [name]. Date of Service: 03/18/2018 1:00 PM Medical Record Number: [id number] Patient Account Number: [id number] Date of Birth/Sex:  [birth year] (72 y.o. F) Treating RN: Roger Shelter Primary Care Gifford Ballon: Leanna Battles Other Clinician: Referring Dajon Rowe: Leanna Battles Treating Anitra Doxtater/Extender: Tito Dine in Treatment: 11 Active Problems Location of Pain Severity and Description of Pain Patient Has Paino No Site Locations Pain Management and Medication Current Pain Management: Electronic Signature(s) Signed: 03/18/2018 5:08:02 PM By: Roger Shelter Entered By: Roger Shelter on 03/18/2018 12:51:30 [name] ([id number]) -------------------------------------------------------------------------------- Patient/Caregiver Education Details Patient Name: [name] Date of Service: 03/18/2018 1:00 PM Medical Record Number: [id number] Patient Account Number: [id number] Date of Birth/Gender: [birth year] (72 y.o. F) Treating RN: Montey Hora Primary Care Physician: Leanna Battles Other Clinician: Referring Physician: Leanna Battles Treating Physician/Extender: Tito Dine in Treatment: 11 Education Assessment Education Provided To: Patient Education Topics Provided Wound/Skin Impairment: Handouts: Other: please keep a bandage on bottom wound Methods: Explain/Verbal Responses: State content correctly Electronic Signature(s) Signed: 03/18/2018 5:11:38 PM By: Montey Hora Entered By: Montey Hora on 03/18/2018 15:29:25 [name] ([id number]) -------------------------------------------------------------------------------- Wound Assessment Details Patient Name: [name]. Date of Service: 03/18/2018 1:00 PM Medical Record Number: [id number] Patient Account Number: [id number] Date of Birth/Sex: 01-28-1946 (72 y.o. F) Treating RN:  Flinchum, Malachy Mood Primary Care Gigi Onstad: Leanna Battles Other Clinician: Referring Sarie Stall: Leanna Battles Treating Nalu Troublefield/Extender: Tito Dine in Treatment: 11 Wound Status Wound Number: 1 Primary  Pressure Ulcer Etiology: Wound Location: Coccyx Wound Status: Open Wounding Event: Pressure Injury Comorbid Cataracts, Hypertension, Type II Diabetes, Date Acquired: 10/20/2017 History: Osteoarthritis Weeks Of Treatment: 11 Clustered Wound: No Photos Photo Uploaded By: Roger Shelter on 03/18/2018 17:13:08 Wound Measurements Length: (cm) 0.5 Width: (cm) 0.5 Depth: (cm) 0.1 Area: (cm) 0.196 Volume: (cm) 0.02 % Reduction in Area: 97.1% % Reduction in Volume: 97.1% Epithelialization: Medium (34-66%) Tunneling: No Undermining: No Wound Description Classification: Category/Stage III Wound Margin: Flat and Intact Exudate Amount: Medium Exudate Type: Serous Exudate Color: amber Foul Odor After Cleansing: No Slough/Fibrino Yes Wound Bed Granulation Amount: None Present (0%) Exposed Structure Necrotic Amount: Large (67-100%) Fascia Exposed: No Necrotic Quality: Eschar Fat Layer (Subcutaneous Tissue) Exposed: No Tendon Exposed: No Muscle Exposed: No Joint Exposed: No Bone Exposed: No Periwound Skin Texture Belter, Lucindy H. ([id number]) Texture Color No Abnormalities Noted: No No Abnormalities Noted: No Callus: No Atrophie Blanche: No Crepitus: No Cyanosis: No Excoriation: No Ecchymosis: No Induration: No Erythema: No Rash: No Hemosiderin Staining: No Scarring: No Mottled: No Pallor: No Moisture Rubor: No No Abnormalities Noted: No Dry / Scaly: No Temperature / Pain Maceration: Yes Temperature: No Abnormality Tenderness on Palpation: Yes Wound Preparation Ulcer Cleansing: Rinsed/Irrigated with Saline Topical Anesthetic Applied: Other: lidocAINE 4%, Treatment Notes Wound #1 (Coccyx) 1. Cleansed with: Clean wound with Normal Saline 2. Anesthetic Topical Lidocaine 4% cream to wound bed prior to debridement 4. Dressing Applied: Other dressing (specify in notes) 5. Secondary Dressing Applied Bordered Foam Dressing Notes silvercel Electronic  Signature(s) Signed: 03/18/2018 5:08:02 PM By: Roger Shelter Entered By: Roger Shelter on 03/18/2018 12:58:47 [name] ([id number]) -------------------------------------------------------------------------------- Vitals Details Patient Name: [name] Date of Service: 03/18/2018 1:00 PM Medical Record Number: [id number] Patient Account Number: [id number] Date of Birth/Sex: 1946/10/04 (72 y.o. F) Treating RN: Roger Shelter Primary Care Katlyn Muldrew: Leanna Battles Other Clinician: Referring Chigozie Basaldua: Leanna Battles Treating Aveon Colquhoun/Extender: Tito Dine in Treatment: 11 Vital Signs Time Taken: 12:51 Temperature (F): 98.4 Height (in): 63 Pulse (bpm): 66 Weight (lbs): 120 Respiratory Rate (breaths/min): 16 Body Mass Index (BMI): 21.3 Blood Pressure (mmHg): 160/78 Reference Range: 80 - 120 mg / dl Electronic Signature(s) Signed: 03/18/2018 5:08:02 PM By: Roger Shelter Entered By: Roger Shelter on 03/18/2018 12:54:42

## 2018-03-20 NOTE — Progress Notes (Signed)
MARYORI, WEIDE ([id number]) Visit Report for 03/18/2018 HPI Details Patient Name: [name], [name]. Date of Service: 03/18/2018 1:00 PM Medical Record Number: [id number] Patient Account Number: [id number] Date of Birth/Sex: 07-Aug-1946 (72 y.o. 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 She had a complicated postoperative course with respiratory failure, acute bilateral watershed cerebral infarcts, reintubation, tracheostomy, postoperative healing by secondary intention with NPWT, blood transfusion; she was treated with broad-spectrum antibiotic (meropenem, vancomycin, Zosyn, Unasyn) and antifungal (anidulafungin). She was also noted to have a sacral pressure ulcer during this admission, treated with Santyl. She was discharged to inpatient rehabilitation, then to SNF/rehab ([facility]) with plans to discharge home this weekend. She's been experiencing multiple complications with ileostomy since arriving to [facility], staff at facility unable to maintain appliance with noted peristomal skin breakdown. We discussed at length ostomy care and skin related challenges with ileostomy effluent. Her daughter-in-law accompanies her today, with multiple questions answered. She presents with a stage 3 sacral/coccyx pressure ulcer. She is ambulatory and has a foam cushion in her wheelchair. The facility has been placing collagen per the daughter-in-law. 01/07/18; this is a patient who came in predominantly related to issues  with a new ileostomy. Apparently changes were made and the ostomy is doing better. She is of course going to have a large amount of ostomy output from an ileostomy and she seems aware of that. During the course of last week's visit she was incidentally discovered to have a pressure ulcer on the sacrum. Using silver alginate this is smaller 01/13/18; the patient has a pressure ulcer on her left gluteal area that was discovered incidentally after review for ileostomy issues. She is at [facility] skilled facility and she is apparently going home next Tuesday. We had given orders for so over alginate and border foam to the pressure ulcer which is stage 3 however she had Xeroform on this today. 4//10/19; the patient has a pressure ulcer on her left gluteal in close proximity to her coccyx. This is smaller than her last visit. There is no surrounding infection. She actually did not leave the facility 2 weeks ago is average size but she is going home next Monday. oShe is having a lot of trouble with adherence of the ostomy bag. Ostomy effluent is draining onto her skin causing irritation. Our intake nurse called the ostomy nurse at the hospital to come over and have a look at this. 02/11/18;the patient returns for two-week follow-up. she is now back at home after being discharged from [facility]. The last time she was here she had ostomy problems as well as a stage II pressure ulcer on her coccyx. We had the ostomy nurse come over and she changed the ostomy seal. The patient returns today things have not gone well. She cannot maintain at obtain a seal. She has constant drainage. It is not clear to me that the nursing home made discharge arrangements for care of her pressure ulcer on her coccyx which is a stage II wound. I don't think anything is being done to this. 02/25/18; last time the patient was here we sent her to the  ER with concern for cellulitis versus desquamative dermatitis involving the abdomen  around her ileostomy. After reviewing the ER they did not feel that this was cellulitis. The had the ostomy nurse, and give her a belt to hold the ostomy in place and things have actually been going better. Also concerning is that she had developed prerenal renal failure with a BUN of 28 and creatinine of 1.80. She was apparently given 2 L of IV fluid. Does not look as though she's had any follow-up labs. Her CO2 was 23, sodium at 122 potassium was 4.1. She has not been systemically unwell. She says she is eating and drinking normally. Her albumin was 3.7. White count was 13.7 although it looks as though her white count has been chronically high She has a small pressure area on the left buttock. This is smaller this time. We have been applying silver alginate but these orders haven't gotten through the home health apparently [facility] where not completely sure about this. The skin or abdomen looks a lot better 03/17/18; the patient states that her abdominal issues related to her ileostomy are a lot better since they gave her a belt to hold this in place. She has a small pressure area on the left buttock in close proximity to her coccyx. She has been using silver alginate. She has advanced home care. His looks a lot better [name], [name] ([id number]) Electronic Signature(s) Signed: 03/18/2018 6:13:14 PM By: Linton Ham MD Entered By: Linton Ham on 03/18/2018 14:47:23 [name] ([id number]) -------------------------------------------------------------------------------- Physical Exam Details Patient Name: [name]. Date of Service: 03/18/2018 1:00 PM Medical Record Number: [id number] Patient Account Number: [id number] Date of Birth/Sex: 1946-02-22 (72 y.o. F) Treating RN: Cornell Barman Primary Care Provider: Leanna Battles Other Clinician: Referring Provider: Leanna Battles Treating Provider/Extender: Tito Dine in Treatment: 11 Constitutional Patient  is hypertensive.. Pulse regular and within target range for patient.Marland Kitchen Respirations regular, non-labored and within target range.. Temperature is normal and within the target range for the patient.Marland Kitchen appears in no distress. Notes wound exam; the area over the sacrum just the left in the buttock is almost closed. Considerable amount of surface callus and nonviable skin removed with saline and gauze. Underneath a healthy small wound. No evidence of surrounding infection Electronic Signature(s) Signed: 03/18/2018 6:13:14 PM By: Linton Ham MD Entered By: Linton Ham on 03/18/2018 14:49:17 [name] ([id number]) -------------------------------------------------------------------------------- Physician Orders Details Patient Name: [name] Date of Service: 03/18/2018 1:00 PM Medical Record Number: [id number] Patient Account Number: [id number] Date of Birth/Sex: 1946-09-24 (72 y.o. F) Treating RN: Cornell Barman Primary Care Provider: Leanna Battles Other Clinician: Referring Provider: Leanna Battles Treating Provider/Extender: Tito Dine in Treatment: 11 Verbal / Phone Orders: No Diagnosis Coding Wound Cleansing Wound #1 Coccyx o Clean wound with Normal Saline. o May Shower, gently pat wound dry prior to applying new dressing. Anesthetic (add to Medication List) Wound #1 Coccyx o Topical Lidocaine 4% cream applied to wound bed prior to debridement (In Clinic Only). Primary Wound Dressing Wound #1 Coccyx o Silver Alginate - Coccyx Secondary Dressing Wound #1 Coccyx o Boardered Foam Dressing - Coccyx Dressing Change Frequency Wound #1 Coccyx o Change dressing every other day. Follow-up Appointments Wound #1 Coccyx o Return Appointment in 2 weeks. Home Health Wound #1 [place] Visits - [facility] please call [facility] with a fax number and the company name so orders can be faxed...[facility] Nurse may visit PRN to address patientos wound care needs. o FACE TO FACE ENCOUNTER: MEDICARE and MEDICAID PATIENTS: I certify that this patient is under my care and that I had a face-to-face encounter that meets the physician face-to-face encounter requirements with this patient on this date. The encounter with the patient was in whole or in part for the following MEDICAL CONDITION: (primary reason for [place]) MEDICAL NECESSITY: I certify, that based on my findings, NURSING services are a medically necessary home health service. HOME BOUND STATUS: I certify that my clinical findings support that this patient is homebound (i.e., Due to illness or injury, pt requires aid of supportive devices such as crutches, cane, wheelchairs, walkers, the use of special transportation or the assistance of another person to leave their place of residence. There is a normal inability to leave the home and doing so requires considerable and taxing effort. Other absences are for medical reasons / religious services and are infrequent or of short duration when for other reasons). o If current dressing causes regression in wound condition, may D/C ordered dressing product/s and apply Normal Saline Moist Dressing daily until next [place] / Other MD appointment. [place] of regression in wound condition at [phone]. [name], [name] ([id number]) o Please direct any NON-WOUND related issues/requests for orders to patient's Primary Care Physician Electronic Signature(s) Signed: 03/18/2018 6:13:14 PM By: Linton Ham MD Signed: 03/19/2018 2:15:42 PM By: Gretta Cool, BSN, RN, CWS, Kim RN, BSN Entered By: Gretta Cool, BSN, RN, CWS, Kim on 03/18/2018 13:28:47 [name] ([id number]) -------------------------------------------------------------------------------- Problem List Details Patient Name: [name]. Date of Service: 03/18/2018 1:00 PM Medical Record Number:  [id number] Patient Account Number: [id number] Date of Birth/Sex: Sep 12, 1946 (72 y.o. F) Treating RN: Cornell Barman Primary Care Provider: Leanna Battles Other Clinician: Referring Provider: Leanna Battles Treating Provider/Extender: Tito Dine in Treatment: 11 Active Problems ICD-10 Impacting Encounter Code Description Active Date Wound Healing Diagnosis L89.153 Pressure ulcer of sacral region, stage 3 12/31/2017 No Yes Z93.2 Ileostomy status 12/31/2017 No Yes R54 Age-related physical debility 12/31/2017 No Yes S30.92XS Unspecified superficial injury of abdominal wall, sequela 12/31/2017 No Yes Inactive Problems Resolved Problems Electronic Signature(s) Signed: 03/18/2018 6:13:14 PM By: Linton Ham MD Entered By: Linton Ham on 03/18/2018 14:45:22 [name] ([id number]) -------------------------------------------------------------------------------- Progress Note Details Patient Name: [name]. Date of Service: 03/18/2018 1:00 PM Medical Record Number: [id number] Patient Account Number: [id number] Date of Birth/Sex: [birth year] (72 y.o. 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 She had a complicated postoperative course with respiratory failure, acute bilateral watershed cerebral infarcts, reintubation, tracheostomy, postoperative healing by secondary intention with NPWT, blood transfusion; she was treated with broad-spectrum antibiotic (meropenem, vancomycin, Zosyn,  Unasyn) and antifungal (anidulafungin). She was also noted to have a sacral pressure ulcer during this admission, treated with Santyl. She was discharged to inpatient rehabilitation, then to SNF/rehab ([facility]) with plans to discharge home this weekend. She's been experiencing multiple complications with ileostomy since arriving to [facility], staff at facility unable to maintain appliance with noted peristomal skin breakdown. We discussed at length ostomy care  and skin related challenges with ileostomy effluent. Her daughter-in-law accompanies her today, with multiple questions answered. She presents with a stage 3 sacral/coccyx pressure ulcer. She is ambulatory and has a foam cushion in her wheelchair. The facility has been placing collagen per the daughter-in-law. 01/07/18; this is a patient who came in predominantly related to issues with a new ileostomy. Apparently changes were made and the ostomy is doing better. She is of course going to have a large amount of ostomy output from an ileostomy and she seems aware of that. During the course of last week's visit she was incidentally discovered to have a pressure ulcer on the sacrum. Using silver alginate this is smaller 01/13/18; the patient has a pressure ulcer on her left gluteal area that was discovered incidentally after review for ileostomy issues. She is at [facility] skilled facility and she is apparently going home next Tuesday. We had given orders for so over alginate and border foam to the pressure ulcer which is stage 3 however she had Xeroform on this today. 4//10/19; the patient has a pressure ulcer on her left gluteal in close proximity to her coccyx. This is smaller than her last visit. There is no surrounding infection. She actually did not leave the facility 2 weeks ago is average size but she is going home next Monday. She is having a lot of trouble with adherence of the ostomy bag. Ostomy effluent is draining onto her  skin causing irritation. Our intake nurse called the ostomy nurse at the hospital to come over and have a look at this. 02/11/18;the patient returns for two-week follow-up. she is now back at home after being discharged from [facility]. The last time she was here she had ostomy problems as well as a stage II pressure ulcer on her coccyx. We had the ostomy nurse come over and she changed the ostomy seal. The patient returns today things have not gone well. She cannot maintain at obtain a seal. She has constant drainage. It is not clear to me that the nursing home made discharge arrangements for care of her pressure ulcer on her coccyx which is a stage II wound. I don't think anything is being done to this. 02/25/18; last time the patient was here we sent her to the ER with concern for cellulitis versus desquamative dermatitis involving the abdomen around her ileostomy. After reviewing the ER they did not feel that this was cellulitis. The had the ostomy nurse, and give her a belt to hold the ostomy in place and things have actually been going better. Also concerning is that she had developed prerenal renal failure with a BUN of 28 and creatinine of 1.80. She was apparently given 2 L of IV fluid. Does not look as though she's had any follow-up labs. Her CO2 was 23, sodium at 122 potassium was 4.1. She has not been systemically unwell. She says she is eating and drinking normally. Her albumin was 3.7. White count was 13.7 although it looks as though her white count has been chronically high She has a small pressure area on the left buttock. This is smaller this time. We have been applying silver alginate but these orders haven't gotten through the home health apparently [facility] where not completely sure about this. The skin or abdomen looks a lot better 03/17/18; the patient states that her abdominal issues related to her ileostomy are a lot better since they gave her a belt to hold this in  place. She has  a small pressure area on the left buttock in close proximity to her coccyx. She has been using silver alginate. She has advanced home care. His looks a lot better [name], Joanna H. ([id number]) Objective Constitutional Patient is hypertensive.. Pulse regular and within target range for patient.Marland Kitchen Respirations regular, non-labored and within target range.. Temperature is normal and within the target range for the patient.Marland Kitchen appears in no distress. Vitals Time Taken: 12:51 PM, Height: 63 in, Weight: 120 lbs, BMI: 21.3, Temperature: 98.4 F, Pulse: 66 bpm, Respiratory Rate: 16 breaths/min, Blood Pressure: 160/78 mmHg. General Notes: wound exam; the area over the sacrum just the left in the buttock is almost closed. Considerable amount of surface callus and nonviable skin removed with saline and gauze. Underneath a healthy small wound. No evidence of surrounding infection Integumentary (Hair, Skin) Wound #1 status is Open. Original cause of wound was Pressure Injury. The wound is located on the Coccyx. The wound measures 0.5cm length x 0.5cm width x 0.1cm depth; 0.196cm^2 area and 0.02cm^3 volume. There is no tunneling or undermining noted. There is a medium amount of serous drainage noted. The wound margin is flat and intact. There is no granulation within the wound bed. There is a large (67-100%) amount of necrotic tissue within the wound bed including Eschar. The periwound skin appearance exhibited: Maceration. The periwound skin appearance did not exhibit: Callus, Crepitus, Excoriation, Induration, Rash, Scarring, Dry/Scaly, Atrophie Blanche, Cyanosis, Ecchymosis, Hemosiderin Staining, Mottled, Pallor, Rubor, Erythema. Periwound temperature was noted as No Abnormality. The periwound has tenderness on palpation. Assessment Active Problems ICD-10 Pressure ulcer of sacral region, stage 3 Ileostomy status Age-related physical debility Unspecified superficial injury of abdominal  wall, sequela Plan Wound Cleansing: Wound #1 Coccyx: Clean wound with Normal Saline. May Shower, gently pat wound dry prior to applying new dressing. Anesthetic (add to Medication List): Wound #1 Coccyx: [name], [name]. ([id number]) Topical Lidocaine 4% cream applied to wound bed prior to debridement (In Clinic Only). Primary Wound Dressing: Wound #1 Coccyx: Silver Alginate - Coccyx Secondary Dressing: Wound #1 Coccyx: Boardered Foam Dressing - Coccyx Dressing Change Frequency: Wound #1 Coccyx: Change dressing every other day. Follow-up Appointments: Wound #1 Coccyx: Return Appointment in 2 weeks. Home Health: Wound #1 Coccyx: Continue Home Health Visits - [facility] please call [facility] with a fax number and the company name so orders can be faxed...[place] may visit PRN to address patient s wound care needs. FACE TO FACE ENCOUNTER: MEDICARE and MEDICAID PATIENTS: I certify that this patient is under my care and that I had a face-to-face encounter that meets the physician face-to-face encounter requirements with this patient on this date. The encounter with the patient was in whole or in part for the following MEDICAL CONDITION: (primary reason for [place]) MEDICAL NECESSITY: I certify, that based on my findings, NURSING services are a medically necessary home health service. HOME BOUND STATUS: I certify that my clinical findings support that this patient is homebound (i.e., Due to illness or injury, pt requires aid of supportive devices such as crutches, cane, wheelchairs, walkers, the use of special transportation or the assistance of another person to leave their place of residence. There is a normal inability to leave the home and doing so requires considerable and taxing effort. Other absences are for medical reasons / religious services and are infrequent or of short duration when for other reasons). If current dressing causes  regression in wound condition, may D/C ordered dressing product/s and  apply Normal Saline Moist Dressing daily until next [place] / Other MD appointment. [place] of regression in wound condition at [phone]. Please direct any NON-WOUND related issues/requests for orders to patient's Primary Care Physician #1continuous over alginate and border foam #2 this should be healed by next week Electronic Signature(s) Signed: 03/18/2018 6:13:14 PM By: Linton Ham MD Entered By: Linton Ham on 03/18/2018 14:49:57 [name] ([id number]) -------------------------------------------------------------------------------- SuperBill Details Patient Name: [name]. Date of Service: 03/18/2018 Medical Record Number: [id number] Patient Account Number: [id number] Date of Birth/Sex: 1946-05-27 (72 y.o. F) Treating RN: Cornell Barman Primary Care Provider: Leanna Battles Other Clinician: Referring Provider: Leanna Battles Treating Provider/Extender: Tito Dine in Treatment: 11 Diagnosis Coding ICD-10 Codes Code Description L89.153 Pressure ulcer of sacral region, stage 3 Z93.2 Ileostomy status R54 Age-related physical debility S30.92XS Unspecified superficial injury of abdominal wall, sequela Facility Procedures CPT4 Code: [id number] Description: [phone] - WOUND CARE VISIT-LEV 2 EST PT Modifier: Quantity: 1 Physician Procedures CPT4 Code: [id number] Description: 03491 - WC PHYS LEVEL 2 - EST PT ICD-10 Diagnosis Description L89.153 Pressure ulcer of sacral region, stage 3 Modifier: Quantity: 1 Electronic Signature(s) Signed: 03/18/2018 6:13:14 PM By: Linton Ham MD Entered By: Linton Ham on 03/18/2018 14:52:48

## 2018-03-24 DIAGNOSIS — J9611 Chronic respiratory failure with hypoxia: Secondary | ICD-10-CM | POA: Diagnosis not present

## 2018-03-24 DIAGNOSIS — J449 Chronic obstructive pulmonary disease, unspecified: Secondary | ICD-10-CM | POA: Diagnosis not present

## 2018-03-24 DIAGNOSIS — Z432 Encounter for attention to ileostomy: Secondary | ICD-10-CM | POA: Diagnosis not present

## 2018-03-24 DIAGNOSIS — I69354 Hemiplegia and hemiparesis following cerebral infarction affecting left non-dominant side: Secondary | ICD-10-CM | POA: Diagnosis not present

## 2018-03-24 DIAGNOSIS — I10 Essential (primary) hypertension: Secondary | ICD-10-CM | POA: Diagnosis not present

## 2018-03-24 DIAGNOSIS — E1151 Type 2 diabetes mellitus with diabetic peripheral angiopathy without gangrene: Secondary | ICD-10-CM | POA: Diagnosis not present

## 2018-03-25 ENCOUNTER — Encounter: Payer: Medicare Other | Admitting: Internal Medicine

## 2018-03-25 DIAGNOSIS — L89159 Pressure ulcer of sacral region, unspecified stage: Secondary | ICD-10-CM | POA: Diagnosis not present

## 2018-03-25 DIAGNOSIS — E119 Type 2 diabetes mellitus without complications: Secondary | ICD-10-CM | POA: Diagnosis not present

## 2018-03-25 DIAGNOSIS — I10 Essential (primary) hypertension: Secondary | ICD-10-CM | POA: Diagnosis not present

## 2018-03-25 DIAGNOSIS — L89329 Pressure ulcer of left buttock, unspecified stage: Secondary | ICD-10-CM | POA: Diagnosis not present

## 2018-03-25 DIAGNOSIS — S3991XA Unspecified injury of abdomen, initial encounter: Secondary | ICD-10-CM | POA: Diagnosis not present

## 2018-03-25 DIAGNOSIS — Z932 Ileostomy status: Secondary | ICD-10-CM | POA: Diagnosis not present

## 2018-03-25 DIAGNOSIS — L89153 Pressure ulcer of sacral region, stage 3: Secondary | ICD-10-CM | POA: Diagnosis not present

## 2018-03-25 DIAGNOSIS — Z9049 Acquired absence of other specified parts of digestive tract: Secondary | ICD-10-CM | POA: Diagnosis not present

## 2018-03-28 NOTE — Progress Notes (Signed)
DATHA, KISSINGER ([id number]) Visit Report for 03/25/2018 HPI Details Patient Name: [name], [name]. Date of Service: 03/25/2018 12:30 PM Medical Record Number: [id number] Patient Account Number: [id number] Date of Birth/Sex: February 08, 1946 (72 y.o. 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 She had a complicated postoperative course with respiratory failure, acute bilateral watershed cerebral infarcts, reintubation, tracheostomy, postoperative healing by secondary intention with NPWT, blood transfusion; she was treated with broad-spectrum antibiotic (meropenem, vancomycin, Zosyn, Unasyn) and antifungal (anidulafungin). She was also noted to have a sacral pressure ulcer during this admission, treated with Santyl. She was discharged to inpatient rehabilitation, then to SNF/rehab ([facility]) with plans to discharge home this weekend. She's been experiencing multiple complications with ileostomy since arriving to [facility], staff at facility unable to maintain appliance with noted peristomal skin breakdown. We discussed at length ostomy care and skin related challenges with ileostomy effluent. Her daughter-in-law accompanies her today, with multiple questions answered. She presents with a stage 3 sacral/coccyx pressure ulcer. She is ambulatory and has a foam cushion in her wheelchair. The facility has been placing collagen per the daughter-in-law. 01/07/18; this is a patient who came in predominantly related to issues with a  new ileostomy. Apparently changes were made and the ostomy is doing better. She is of course going to have a large amount of ostomy output from an ileostomy and she seems aware of that. During the course of last week's visit she was incidentally discovered to have a pressure ulcer on the sacrum. Using silver alginate this is smaller 01/13/18; the patient has a pressure ulcer on her left gluteal area that was discovered incidentally after review for ileostomy issues. She is at [facility] skilled facility and she is apparently going home next Tuesday. We had given orders for so over alginate and border foam to the pressure ulcer which is stage 3 however she had Xeroform on this today. 4//10/19; the patient has a pressure ulcer on her left gluteal in close proximity to her coccyx. This is smaller than her last visit. There is no surrounding infection. She actually did not leave the facility 2 weeks ago is average size but she is going home next Monday. oShe is having a lot of trouble with adherence of the ostomy bag. Ostomy effluent is draining onto her skin causing irritation. Our intake nurse called the ostomy nurse at the hospital to come over and have a look at this. 02/11/18;the patient returns for two-week follow-up. she is now back at home after being discharged from [facility]. The last time she was here she had ostomy problems as well as a stage II pressure ulcer on her coccyx. We had the ostomy nurse come over and she changed the ostomy seal. The patient returns today things have not gone well. She cannot maintain at obtain a seal. She has constant drainage. It is not clear to me that the nursing home made discharge arrangements for care of her pressure ulcer on her coccyx which is a stage II wound. I don't think anything is being done to this. 02/25/18; last time the patient was here we sent her to the ER with  concern for cellulitis versus desquamative dermatitis involving the abdomen around  her ileostomy. After reviewing the ER they did not feel that this was cellulitis. The had the ostomy nurse, and give her a belt to hold the ostomy in place and things have actually been going better. Also concerning is that she had developed prerenal renal failure with a BUN of 28 and creatinine of 1.80. She was apparently given 2 L of IV fluid. Does not look as though she's had any follow-up labs. Her CO2 was 23, sodium at 122 potassium was 4.1. She has not been systemically unwell. She says she is eating and drinking normally. Her albumin was 3.7. White count was 13.7 although it looks as though her white count has been chronically high She has a small pressure area on the left buttock. This is smaller this time. We have been applying silver alginate but these orders haven't gotten through the home health apparently [facility] where not completely sure about this. The skin or abdomen looks a lot better 03/17/18; the patient states that her abdominal issues related to her ileostomy are a lot better since they gave her a belt to hold this in place. She has a small pressure area on the left buttock in close proximity to her coccyx. She has been using silver alginate. She has advanced home care. His looks a lot better [name], [name]. ([id number]) 6 last 12/19; patient states her ileostomy is continuing to be better. Her small pressure ulcer on the left buttock in close proximity to her coccyx is fully epithelialized. She had been using silver alginate and had [facility] Electronic Signature(s) Signed: 03/25/2018 4:49:36 PM By: Linton Ham MD Entered By: Linton Ham on 03/25/2018 13:05:59 [name] ([id number]) -------------------------------------------------------------------------------- Physical Exam Details Patient Name: [name]. Date of Service: 03/25/2018 12:30 PM Medical Record Number: [id number] Patient Account Number: [id number] Date of Birth/Sex:  1945-11-01 (72 y.o. F) Treating RN: Primary Care Provider: Leanna Battles Other Clinician: Referring Provider: Leanna Battles Treating Provider/Extender: Tito Dine in Treatment: 12 Constitutional Patient is hypertensive.. Pulse regular and within target range for patient.Marland Kitchen Respirations regular, non-labored and within target range.. Temperature is normal and within the target range for the patient.Marland Kitchen appears in no distress. Notes mood exam; the area on the left buttock is totally epithelialized. There is no open area here. Surface epithelium looks a little vulnerable I told her to be vigilant about this but other than that things are healed here. Electronic Signature(s) Signed: 03/25/2018 4:49:36 PM By: Linton Ham MD Entered By: Linton Ham on 03/25/2018 13:10:40 [name] ([id number]) -------------------------------------------------------------------------------- Physician Orders Details Patient Name: [name] Date of Service: 03/25/2018 12:30 PM Medical Record Number: [id number] Patient Account Number: [id number] Date of Birth/Sex: 05-Apr-1946 (72 y.o. F) Treating RN: Cornell Barman Primary Care Provider: Leanna Battles Other Clinician: Referring Provider: Leanna Battles Treating Provider/Extender: Tito Dine in Treatment: 12 Verbal / Phone Orders: No Diagnosis Coding Discharge From [facility] Services o Discharge from [facility] complete Electronic Signature(s) Signed: 03/25/2018 4:49:36 PM By: Linton Ham MD Signed: 03/26/2018 8:47:48 AM By: Gretta Cool, BSN, RN, CWS, Kim RN, BSN Entered By: Gretta Cool, BSN, RN, CWS, Kim on 03/25/2018 13:01:43 [name] ([id number]) -------------------------------------------------------------------------------- Problem List Details Patient Name: [name] Date of Service: 03/25/2018 12:30 PM Medical Record Number: [id number] Patient Account Number: [id number] Date of  Birth/Sex: 1945-11-27 (72 y.o. F) Treating RN: Primary Care Provider: Leanna Battles Other Clinician: Referring  Provider: Leanna Battles Treating Provider/Extender: Tito Dine in Treatment: 12 Active Problems ICD-10 Impacting Encounter Code Description Active Date Wound Healing Diagnosis L89.153 Pressure ulcer of sacral region, stage 3 12/31/2017 No Yes Z93.2 Ileostomy status 12/31/2017 No Yes R54 Age-related physical debility 12/31/2017 No Yes S30.92XS Unspecified superficial injury of abdominal wall, sequela 12/31/2017 No Yes Inactive Problems Resolved Problems Electronic Signature(s) Signed: 03/25/2018 4:49:36 PM By: Linton Ham MD Entered By: Linton Ham on 03/25/2018 13:01:33 [name] ([id number]) -------------------------------------------------------------------------------- Progress Note Details Patient Name: [name]. Date of Service: 03/25/2018 12:30 PM Medical Record Number: [id number] Patient Account Number: [id number] Date of Birth/Sex: 1946-09-12 (71 y.o. 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 She had a complicated postoperative course with respiratory failure, acute bilateral watershed cerebral infarcts, reintubation, tracheostomy, postoperative healing by secondary intention with NPWT, blood transfusion; she was treated with broad-spectrum antibiotic (meropenem, vancomycin, Zosyn, Unasyn) and antifungal (anidulafungin). She was also noted to have a sacral  pressure ulcer during this admission, treated with Santyl. She was discharged to inpatient rehabilitation, then to SNF/rehab ([facility]) with plans to discharge home this weekend. She's been experiencing multiple complications with ileostomy since arriving to [facility], staff at facility unable to maintain appliance with noted peristomal skin breakdown. We discussed at length ostomy care and skin related challenges with ileostomy effluent. Her daughter-in-law accompanies her today, with multiple questions answered. She presents with a stage 3 sacral/coccyx pressure ulcer. She is ambulatory and has a foam cushion in her wheelchair. The facility has been placing collagen per the daughter-in-law. 01/07/18; this is a patient who came in predominantly related to issues with a new ileostomy. Apparently changes were made and the ostomy is doing better. She is of course going to have a large amount of ostomy output from an ileostomy and she seems aware of that. During the course of last week's visit she was incidentally discovered to have a pressure ulcer on the sacrum. Using silver alginate this is smaller 01/13/18; the patient has a pressure ulcer on her left gluteal area that was discovered incidentally after review for ileostomy issues. She is at [facility] skilled facility and she is apparently going home next Tuesday. We had given orders for so over alginate and border foam to the pressure ulcer which is stage 3 however she had Xeroform on this today. 4//10/19; the patient has a pressure ulcer on her left gluteal in close proximity to her coccyx. This is smaller than her last visit. There is no surrounding infection. She actually did not leave the facility 2 weeks ago is average size but she is going home next Monday. She is having a lot of trouble with adherence of the ostomy bag. Ostomy effluent is draining onto her skin causing irritation. Our intake nurse called the ostomy nurse at the  hospital to come over and have a look at this. 02/11/18;the patient returns for two-week follow-up. she is now back at home after being discharged from [facility]. The last time she was here she had ostomy problems as well as a stage II pressure ulcer on her coccyx. We had the ostomy nurse  come over and she changed the ostomy seal. The patient returns today things have not gone well. She cannot maintain at obtain a seal. She has constant drainage. It is not clear to me that the nursing home made discharge arrangements for care of her pressure ulcer on her coccyx which is a stage II wound. I don't think anything is being done to this. 02/25/18; last time the patient was here we sent her to the ER with concern for cellulitis versus desquamative dermatitis involving the abdomen around her ileostomy. After reviewing the ER they did not feel that this was cellulitis. The had the ostomy nurse, and give her a belt to hold the ostomy in place and things have actually been going better. Also concerning is that she had developed prerenal renal failure with a BUN of 28 and creatinine of 1.80. She was apparently given 2 L of IV fluid. Does not look as though she's had any follow-up labs. Her CO2 was 23, sodium at 122 potassium was 4.1. She has not been systemically unwell. She says she is eating and drinking normally. Her albumin was 3.7. White count was 13.7 although it looks as though her white count has been chronically high She has a small pressure area on the left buttock. This is smaller this time. We have been applying silver alginate but these orders haven't gotten through the home health apparently [facility] where not completely sure about this. The skin or abdomen looks a lot better 03/17/18; the patient states that her abdominal issues related to her ileostomy are a lot better since they gave her a belt to hold this in place. She has a small pressure area on the left buttock in close proximity  to her coccyx. She has been using silver alginate. She has advanced home care. His looks a lot better 6 last 12/19; patient states her ileostomy is continuing to be better. Her small pressure ulcer on the left buttock in close proximity to her coccyx is fully epithelialized. She had been using silver alginate and had [facility] [name], [name] ([id number]) Objective Constitutional Patient is hypertensive.. Pulse regular and within target range for patient.Marland Kitchen Respirations regular, non-labored and within target range.. Temperature is normal and within the target range for the patient.Marland Kitchen appears in no distress. Vitals Time Taken: 12:44 PM, Height: 63 in, Weight: 120 lbs, BMI: 21.3, Temperature: 98.1 F, Pulse: 77 bpm, Respiratory Rate: 16 breaths/min, Blood Pressure: 148/95 mmHg. General Notes: mood exam; the area on the left buttock is totally epithelialized. There is no open area here. Surface epithelium looks a little vulnerable I told her to be vigilant about this but other than that things are healed here. Integumentary (Hair, Skin) Wound #1 status is Healed - Epithelialized. Original cause of wound was Pressure Injury. The wound is located on the Coccyx. The wound measures 0cm length x 0cm width x 0cm depth; 0cm^2 area and 0cm^3 volume. Assessment Active Problems ICD-10 Pressure ulcer of sacral region, stage 3 Ileostomy status Age-related physical debility Unspecified superficial injury of abdominal wall, sequela Plan Discharge From [facility] Services: Discharge from [facility] - Treatment complete #1 and the patient to be discharged from the wound care center #2 advised to be vigilant about the pressure ulcer area on the medial left buttock. [name], [name] ([id number]) #3 she recently came to [place] with ileostomy problems. This was largely managed by the wound ostomy nurses at the hospital. Apparently she has an ostomy belt now that is keeping  the original difficult to place  ostomy in place Electronic Signature(s) Signed: 03/25/2018 4:49:36 PM By: Linton Ham MD Entered By: Linton Ham on 03/25/2018 13:12:20 [name] ([id number]) -------------------------------------------------------------------------------- SuperBill Details Patient Name: [name] Date of Service: 03/25/2018 Medical Record Number: [id number] Patient Account Number: [id number] Date of Birth/Sex: 20-Mar-1946 (72 y.o. F) Treating RN: Primary Care Provider: Leanna Battles Other Clinician: Referring Provider: Leanna Battles Treating Provider/Extender: Tito Dine in Treatment: 12 Diagnosis Coding ICD-10 Codes Code Description L89.153 Pressure ulcer of sacral region, stage 3 Z93.2 Ileostomy status R54 Age-related physical debility S30.92XS Unspecified superficial injury of abdominal wall, sequela Facility Procedures CPT4 Code: [id number] Description: 14388 - WOUND CARE VISIT-LEV 2 EST PT Modifier: Quantity: 1 Physician Procedures CPT4 Code: [id number] Description: 82060 - WC PHYS LEVEL 2 - EST PT ICD-10 Diagnosis Description L89.153 Pressure ulcer of sacral region, stage 3 Z93.2 Ileostomy status Modifier: Quantity: 1 Electronic Signature(s) Signed: 03/25/2018 4:49:36 PM By: Linton Ham MD Entered By: Linton Ham on 03/25/2018 13:12:47

## 2018-03-28 NOTE — Progress Notes (Signed)
BATOUL, LIMES ([id number]) Visit Report for 03/25/2018 Arrival Information Details Patient Name: [name], [name]. Date of Service: 03/25/2018 12:30 PM Medical Record Number: [id number] Patient Account Number: [id number] Date of Birth/Sex: 02-28-1946 (72 y.o. F) Treating RN: Montey Hora Primary Care Leanora Murin: Leanna Battles Other Clinician: Referring Kazuki Ingle: Leanna Battles Treating Eris Hannan/Extender: Tito Dine in Treatment: 12 Visit Information History Since Last Visit Added or deleted any medications: No Patient Arrived: Ambulatory Any new allergies or adverse reactions: No Arrival Time: 12:44 Had a fall or experienced change in No Accompanied By: self activities of daily living that may affect Transfer Assistance: None risk of falls: Patient Identification Verified: Yes Signs or symptoms of abuse/neglect since last visito No Secondary Verification Process Completed: Yes Hospitalized since last visit: No Patient Requires Transmission-Based No Implantable device outside of the clinic excluding No Precautions: cellular tissue based products placed in the center Patient Has Alerts: Yes since last visit: Patient Alerts: DMII Has Dressing in Place as Prescribed: Yes Pain Present Now: No Electronic Signature(s) Signed: 03/25/2018 3:50:46 PM By: Montey Hora Entered By: Montey Hora on 03/25/2018 12:44:33 [name] ([id number]) -------------------------------------------------------------------------------- Clinic Level of Care Assessment Details Patient Name: [name] Date of Service: 03/25/2018 12:30 PM Medical Record Number: [id number] Patient Account Number: [id number] Date of Birth/Sex: 1945-12-15 (72 y.o. F) Treating RN: Cornell Barman Primary Care Abhi Moccia: Leanna Battles Other Clinician: Referring Marysol Wellnitz: Leanna Battles Treating Tres Grzywacz/Extender: Tito Dine in Treatment: 12 Clinic Level of Care Assessment  Items TOOL 4 Quantity Score []  - Use when only an EandM is performed on FOLLOW-UP visit 0 ASSESSMENTS - Nursing Assessment / Reassessment []  - Reassessment of Co-morbidities (includes updates in patient status) 0 X- 1 5 Reassessment of Adherence to Treatment Plan ASSESSMENTS - Wound and Skin Assessment / Reassessment X - Simple Wound Assessment / Reassessment - one wound 1 5 []  - 0 Complex Wound Assessment / Reassessment - multiple wounds []  - 0 Dermatologic / Skin Assessment (not related to wound area) ASSESSMENTS - Focused Assessment []  - Circumferential Edema Measurements - multi extremities 0 []  - 0 Nutritional Assessment / Counseling / Intervention []  - 0 Lower Extremity Assessment (monofilament, tuning fork, pulses) []  - 0 Peripheral Arterial Disease Assessment (using hand held doppler) ASSESSMENTS - Ostomy and/or Continence Assessment and Care []  - Incontinence Assessment and Management 0 []  - 0 Ostomy Care Assessment and Management (repouching, etc.) PROCESS - Coordination of Care X - Simple Patient / Family Education for ongoing care 1 15 []  - 0 Complex (extensive) Patient / Family Education for ongoing care []  - 0 Staff obtains Programmer, systems, Records, Test Results / Process Orders []  - 0 Staff telephones HHA, Nursing Homes / Clarify orders / etc []  - 0 Routine Transfer to another Facility (non-emergent condition) []  - 0 Routine Hospital Admission (non-emergent condition) []  - 0 New Admissions / Biomedical engineer / Ordering NPWT, Apligraf, etc. []  - 0 Emergency Hospital Admission (emergent condition) X- 1 10 Simple Discharge Coordination BRYLEE, BERK. ([id number]) []  - 0 Complex (extensive) Discharge Coordination PROCESS - Special Needs []  - Pediatric / Minor Patient Management 0 []  - 0 Isolation Patient Management []  - 0 Hearing / Language / Visual special needs []  - 0 Assessment of Community assistance (transportation, D/C planning, etc.) []  -  0 Additional assistance / Altered mentation []  - 0 Support Surface(s) Assessment (bed, cushion, seat, etc.) INTERVENTIONS - Wound Cleansing / Measurement []  - Simple Wound Cleansing - one wound 0 []  - 0 Complex  Wound Cleansing - multiple wounds X- 1 5 Wound Imaging (photographs - any number of wounds) []  - 0 Wound Tracing (instead of photographs) []  - 0 Simple Wound Measurement - one wound []  - 0 Complex Wound Measurement - multiple wounds INTERVENTIONS - Wound Dressings []  - Small Wound Dressing one or multiple wounds 0 []  - 0 Medium Wound Dressing one or multiple wounds []  - 0 Large Wound Dressing one or multiple wounds []  - 0 Application of Medications - topical []  - 0 Application of Medications - injection INTERVENTIONS - Miscellaneous []  - External ear exam 0 []  - 0 Specimen Collection (cultures, biopsies, blood, body fluids, etc.) []  - 0 Specimen(s) / Culture(s) sent or taken to Lab for analysis []  - 0 Patient Transfer (multiple staff / Civil Service fast streamer / Similar devices) []  - 0 Simple Staple / Suture removal (25 or less) []  - 0 Complex Staple / Suture removal (26 or more) []  - 0 Hypo / Hyperglycemic Management (close monitor of Blood Glucose) []  - 0 Ankle / Brachial Index (ABI) - do not check if billed separately X- 1 5 Vital Signs Henkes, Loany H. ([id number]) Has the patient been seen at the hospital within the last three years: Yes Total Score: 45 Level Of Care: New/Established - Level 2 Electronic Signature(s) Signed: 03/26/2018 8:47:48 AM By: Gretta Cool, BSN, RN, CWS, Kim RN, BSN Entered By: Gretta Cool, BSN, RN, CWS, Kim on 03/25/2018 13:02:14 [name] ([id number]) -------------------------------------------------------------------------------- Encounter Discharge Information Details Patient Name: [name]. Date of Service: 03/25/2018 12:30 PM Medical Record Number: [id number] Patient Account Number: [id number] Date of Birth/Sex: [birth year] (72 y.o.  F) Treating RN: Cornell Barman Primary Care Sasuke Yaffe: Leanna Battles Other Clinician: Referring Jansen Sciuto: Leanna Battles Treating Tremel Setters/Extender: Tito Dine in Treatment: 12 Encounter Discharge Information Items Discharge Condition: Stable Ambulatory Status: Ambulatory Discharge Destination: Home Transportation: Private Auto Accompanied By: self Schedule Follow-up Appointment: No Clinical Summary of Care: Electronic Signature(s) Signed: 03/26/2018 8:47:48 AM By: Gretta Cool, BSN, RN, CWS, Kim RN, BSN Entered By: Gretta Cool, BSN, RN, CWS, Kim on 03/25/2018 13:02:45 [name] ([id number]) -------------------------------------------------------------------------------- Lower Extremity Assessment Details Patient Name: [name]. Date of Service: 03/25/2018 12:30 PM Medical Record Number: [id number] Patient Account Number: [id number] Date of Birth/Sex: 11/01/1945 (72 y.o. F) Treating RN: Montey Hora Primary Care Kimmy Totten: Leanna Battles Other Clinician: Referring Demondre Aguas: Leanna Battles Treating Gable Odonohue/Extender: Ricard Dillon Weeks in Treatment: 12 Electronic Signature(s) Signed: 03/25/2018 3:50:46 PM By: Montey Hora Entered By: Montey Hora on 03/25/2018 12:48:16 [name] ([id number]) -------------------------------------------------------------------------------- Multi Wound Chart Details Patient Name: [name]. Date of Service: 03/25/2018 12:30 PM Medical Record Number: [id number] Patient Account Number: [id number] Date of Birth/Sex: 09-27-1946 (72 y.o. F) Treating RN: Cornell Barman Primary Care Dacotah Cabello: Leanna Battles Other Clinician: Referring Matraca Hunkins: Leanna Battles Treating Dawnell Bryant/Extender: Tito Dine in Treatment: 12 Vital Signs Height(in): 63 Pulse(bpm): 77 Weight(lbs): 120 Blood Pressure(mmHg): 148/95 Body Mass Index(BMI): 21 Temperature(F): 98.1 Respiratory Rate 16 (breaths/min): Photos: [1:No  Photos] [N/A:N/A] Wound Location: [1:Coccyx] [N/A:N/A] Wounding Event: [1:Pressure Injury] [N/A:N/A] Primary Etiology: [1:Pressure Ulcer] [N/A:N/A] Date Acquired: [1:10/20/2017] [N/A:N/A] Weeks of Treatment: [1:12] [N/A:N/A] Wound Status: [1:Healed - Epithelialized] [N/A:N/A] Measurements L x W x D [1:0x0x0] [N/A:N/A] (cm) Area (cm) : [1:0] [N/A:N/A] Volume (cm) : [1:0] [N/A:N/A] % Reduction in Area: [1:100.00%] [N/A:N/A] % Reduction in Volume: [1:100.00%] [N/A:N/A] Classification: [1:Category/Stage III] [N/A:N/A] Periwound Skin Texture: [1:No Abnormalities Noted] [N/A:N/A] Periwound Skin Moisture: [1:No Abnormalities Noted] [N/A:N/A] Periwound Skin Color: [1:No Abnormalities Noted No] [  N/A:N/A N/A] Treatment Notes Electronic Signature(s) Signed: 03/25/2018 4:49:36 PM By: Linton Ham MD Entered By: Linton Ham on 03/25/2018 13:05:02 [name] ([id number]) -------------------------------------------------------------------------------- Multi-Disciplinary Care Plan Details Patient Name: [name] Date of Service: 03/25/2018 12:30 PM Medical Record Number: [id number] Patient Account Number: [id number] Date of Birth/Sex: 07/10/1946 (72 y.o. F) Treating RN: Cornell Barman Primary Care Emogene Muratalla: Leanna Battles Other Clinician: Referring Jamarria Real: Leanna Battles Treating Linsey Arteaga/Extender: Tito Dine in Treatment: 12 Active Inactive Electronic Signature(s) Signed: 03/26/2018 8:47:48 AM By: Gretta Cool, BSN, RN, CWS, Kim RN, BSN Entered By: Gretta Cool, BSN, RN, CWS, Kim on 03/25/2018 13:00:04 [name] ([id number]) -------------------------------------------------------------------------------- Pain Assessment Details Patient Name: [name] Date of Service: 03/25/2018 12:30 PM Medical Record Number: [id number] Patient Account Number: [id number] Date of Birth/Sex: September 15, 1946 (72 y.o. F) Treating RN: Montey Hora Primary Care Jeanita Carneiro: Leanna Battles Other Clinician: Referring Neymar Dowe: Leanna Battles Treating Sharhonda Atwood/Extender: Tito Dine in Treatment: 12 Active Problems Location of Pain Severity and Description of Pain Patient Has Paino No Site Locations Pain Management and Medication Current Pain Management: Electronic Signature(s) Signed: 03/25/2018 3:50:46 PM By: Montey Hora Entered By: Montey Hora on 03/25/2018 12:44:41 [name] ([id number]) -------------------------------------------------------------------------------- Patient/Caregiver Education Details Patient Name: [name] Date of Service: 03/25/2018 12:30 PM Medical Record Number: [id number] Patient Account Number: [id number] Date of Birth/Gender: 02-05-1946 (72 y.o. F) Treating RN: Cornell Barman Primary Care Physician: Leanna Battles Other Clinician: Referring Physician: Leanna Battles Treating Physician/Extender: Tito Dine in Treatment: 12 Education Assessment Education Provided To: Patient Education Topics Provided Wound/Skin Impairment: Handouts: Other: protect area FOR NEXT FEW WEEKS Methods: Explain/Verbal Responses: State content correctly Electronic Signature(s) Signed: 03/26/2018 8:47:48 AM By: Gretta Cool, BSN, RN, CWS, Kim RN, BSN Entered By: Gretta Cool, BSN, RN, CWS, Kim on 03/25/2018 13:03:11 [name] ([id number]) -------------------------------------------------------------------------------- Wound Assessment Details Patient Name: [name]. Date of Service: 03/25/2018 12:30 PM Medical Record Number: [id number] Patient Account Number: [id number] Date of Birth/Sex: Sep 20, 1946 (72 y.o. F) Treating RN: Montey Hora Primary Care Kethan Papadopoulos: Leanna Battles Other Clinician: Referring Kyon Bentler: Leanna Battles Treating Lazer Wollard/Extender: Tito Dine in Treatment: 12 Wound Status Wound Number: 1 Primary Etiology: Pressure Ulcer Wound Location: Coccyx Wound Status: Healed -  Epithelialized Wounding Event: Pressure Injury Date Acquired: 10/20/2017 Weeks Of Treatment: 12 Clustered Wound: No Photos Photo Uploaded By: Montey Hora on 03/25/2018 15:26:05 Wound Measurements Length: (cm) 0 Width: (cm) 0 Depth: (cm) 0 Area: (cm) 0 Volume: (cm) 0 % Reduction in Area: 100% % Reduction in Volume: 100% Wound Description Classification: Category/Stage III Periwound Skin Texture Texture Color No Abnormalities Noted: No No Abnormalities Noted: No Moisture No Abnormalities Noted: No Electronic Signature(s) Signed: 03/25/2018 3:50:46 PM By: Montey Hora Entered By: Montey Hora on 03/25/2018 12:48:05 [name] ([id number]) -------------------------------------------------------------------------------- Vitals Details Patient Name: [name]. Date of Service: 03/25/2018 12:30 PM Medical Record Number: [id number] Patient Account Number: [id number] Date of Birth/Sex: Apr 08, 1946 (72 y.o. F) Treating RN: Montey Hora Primary Care Alzena Gerber: Leanna Battles Other Clinician: Referring Brandyn Thien: Leanna Battles Treating Gram Siedlecki/Extender: Tito Dine in Treatment: 12 Vital Signs Time Taken: 12:44 Temperature (F): 98.1 Height (in): 63 Pulse (bpm): 77 Weight (lbs): 120 Respiratory Rate (breaths/min): 16 Body Mass Index (BMI): 21.3 Blood Pressure (mmHg): 148/95 Reference Range: 80 - 120 mg / dl Electronic Signature(s) Signed: 03/25/2018 3:50:46 PM By: Montey Hora Entered By: Montey Hora on 03/25/2018 12:47:57

## 2018-04-01 DIAGNOSIS — J449 Chronic obstructive pulmonary disease, unspecified: Secondary | ICD-10-CM | POA: Diagnosis not present

## 2018-04-01 DIAGNOSIS — Z432 Encounter for attention to ileostomy: Secondary | ICD-10-CM | POA: Diagnosis not present

## 2018-04-01 DIAGNOSIS — I69354 Hemiplegia and hemiparesis following cerebral infarction affecting left non-dominant side: Secondary | ICD-10-CM | POA: Diagnosis not present

## 2018-04-01 DIAGNOSIS — E1151 Type 2 diabetes mellitus with diabetic peripheral angiopathy without gangrene: Secondary | ICD-10-CM | POA: Diagnosis not present

## 2018-04-01 DIAGNOSIS — I10 Essential (primary) hypertension: Secondary | ICD-10-CM | POA: Diagnosis not present

## 2018-04-01 DIAGNOSIS — J9611 Chronic respiratory failure with hypoxia: Secondary | ICD-10-CM | POA: Diagnosis not present

## 2018-04-02 DIAGNOSIS — Z85828 Personal history of other malignant neoplasm of skin: Secondary | ICD-10-CM | POA: Diagnosis not present

## 2018-04-02 DIAGNOSIS — L905 Scar conditions and fibrosis of skin: Secondary | ICD-10-CM | POA: Diagnosis not present

## 2018-04-04 DIAGNOSIS — F329 Major depressive disorder, single episode, unspecified: Secondary | ICD-10-CM | POA: Diagnosis not present

## 2018-04-04 DIAGNOSIS — F3289 Other specified depressive episodes: Secondary | ICD-10-CM | POA: Diagnosis not present

## 2018-04-04 DIAGNOSIS — I2510 Atherosclerotic heart disease of native coronary artery without angina pectoris: Secondary | ICD-10-CM | POA: Diagnosis not present

## 2018-04-04 DIAGNOSIS — D649 Anemia, unspecified: Secondary | ICD-10-CM | POA: Diagnosis not present

## 2018-04-04 DIAGNOSIS — I69392 Facial weakness following cerebral infarction: Secondary | ICD-10-CM | POA: Diagnosis not present

## 2018-04-04 DIAGNOSIS — J9611 Chronic respiratory failure with hypoxia: Secondary | ICD-10-CM | POA: Diagnosis not present

## 2018-04-04 DIAGNOSIS — I69315 Cognitive social or emotional deficit following cerebral infarction: Secondary | ICD-10-CM | POA: Diagnosis not present

## 2018-04-04 DIAGNOSIS — M1990 Unspecified osteoarthritis, unspecified site: Secondary | ICD-10-CM | POA: Diagnosis not present

## 2018-04-04 DIAGNOSIS — Z9181 History of falling: Secondary | ICD-10-CM | POA: Diagnosis not present

## 2018-04-04 DIAGNOSIS — I10 Essential (primary) hypertension: Secondary | ICD-10-CM | POA: Diagnosis not present

## 2018-04-04 DIAGNOSIS — R1312 Dysphagia, oropharyngeal phase: Secondary | ICD-10-CM | POA: Diagnosis not present

## 2018-04-04 DIAGNOSIS — J449 Chronic obstructive pulmonary disease, unspecified: Secondary | ICD-10-CM | POA: Diagnosis not present

## 2018-04-04 DIAGNOSIS — F419 Anxiety disorder, unspecified: Secondary | ICD-10-CM | POA: Diagnosis not present

## 2018-04-04 DIAGNOSIS — K219 Gastro-esophageal reflux disease without esophagitis: Secondary | ICD-10-CM | POA: Diagnosis not present

## 2018-04-04 DIAGNOSIS — E46 Unspecified protein-calorie malnutrition: Secondary | ICD-10-CM | POA: Diagnosis not present

## 2018-04-04 DIAGNOSIS — D6489 Other specified anemias: Secondary | ICD-10-CM | POA: Diagnosis not present

## 2018-04-04 DIAGNOSIS — F5103 Paradoxical insomnia: Secondary | ICD-10-CM | POA: Diagnosis not present

## 2018-04-04 DIAGNOSIS — Z432 Encounter for attention to ileostomy: Secondary | ICD-10-CM | POA: Diagnosis not present

## 2018-04-04 DIAGNOSIS — E1151 Type 2 diabetes mellitus with diabetic peripheral angiopathy without gangrene: Secondary | ICD-10-CM | POA: Diagnosis not present

## 2018-04-04 DIAGNOSIS — I69354 Hemiplegia and hemiparesis following cerebral infarction affecting left non-dominant side: Secondary | ICD-10-CM | POA: Diagnosis not present

## 2018-04-04 DIAGNOSIS — E785 Hyperlipidemia, unspecified: Secondary | ICD-10-CM | POA: Diagnosis not present

## 2018-04-04 DIAGNOSIS — F17210 Nicotine dependence, cigarettes, uncomplicated: Secondary | ICD-10-CM | POA: Diagnosis not present

## 2018-04-04 DIAGNOSIS — K589 Irritable bowel syndrome without diarrhea: Secondary | ICD-10-CM | POA: Diagnosis not present

## 2018-04-08 ENCOUNTER — Emergency Department (HOSPITAL_COMMUNITY)
Admission: EM | Admit: 2018-04-08 | Discharge: 2018-04-09 | Disposition: A | Discharge status: Home / Self Care | Payer: Medicare Other | Attending: Emergency Medicine | Admitting: Emergency Medicine

## 2018-04-08 ENCOUNTER — Other Ambulatory Visit: Payer: Self-pay

## 2018-04-08 ENCOUNTER — Encounter (HOSPITAL_COMMUNITY): Payer: Self-pay | Admitting: *Deleted

## 2018-04-08 DIAGNOSIS — E119 Type 2 diabetes mellitus without complications: Secondary | ICD-10-CM | POA: Insufficient documentation

## 2018-04-08 DIAGNOSIS — I10 Essential (primary) hypertension: Secondary | ICD-10-CM | POA: Insufficient documentation

## 2018-04-08 DIAGNOSIS — I2510 Atherosclerotic heart disease of native coronary artery without angina pectoris: Secondary | ICD-10-CM | POA: Insufficient documentation

## 2018-04-08 DIAGNOSIS — F419 Anxiety disorder, unspecified: Secondary | ICD-10-CM | POA: Insufficient documentation

## 2018-04-08 DIAGNOSIS — Z79899 Other long term (current) drug therapy: Secondary | ICD-10-CM | POA: Diagnosis not present

## 2018-04-08 DIAGNOSIS — Z8673 Personal history of transient ischemic attack (TIA), and cerebral infarction without residual deficits: Secondary | ICD-10-CM | POA: Diagnosis not present

## 2018-04-08 DIAGNOSIS — I69354 Hemiplegia and hemiparesis following cerebral infarction affecting left non-dominant side: Secondary | ICD-10-CM | POA: Diagnosis not present

## 2018-04-08 DIAGNOSIS — F329 Major depressive disorder, single episode, unspecified: Secondary | ICD-10-CM | POA: Diagnosis not present

## 2018-04-08 DIAGNOSIS — Z87891 Personal history of nicotine dependence: Secondary | ICD-10-CM | POA: Insufficient documentation

## 2018-04-08 DIAGNOSIS — J9611 Chronic respiratory failure with hypoxia: Secondary | ICD-10-CM | POA: Diagnosis not present

## 2018-04-08 DIAGNOSIS — R51 Headache: Secondary | ICD-10-CM | POA: Diagnosis not present

## 2018-04-08 DIAGNOSIS — E1151 Type 2 diabetes mellitus with diabetic peripheral angiopathy without gangrene: Secondary | ICD-10-CM | POA: Diagnosis not present

## 2018-04-08 DIAGNOSIS — J449 Chronic obstructive pulmonary disease, unspecified: Secondary | ICD-10-CM | POA: Insufficient documentation

## 2018-04-08 DIAGNOSIS — Z432 Encounter for attention to ileostomy: Secondary | ICD-10-CM | POA: Diagnosis not present

## 2018-04-08 LAB — BASIC METABOLIC PANEL
Anion gap: 7 (ref 5–15)
BUN: 17 mg/dL (ref 8–23)
CALCIUM: 9.2 mg/dL (ref 8.9–10.3)
CO2: 25 mmol/L (ref 22–32)
CREATININE: 1.33 mg/dL — AB (ref 0.44–1.00)
Chloride: 107 mmol/L (ref 98–111)
GFR, EST AFRICAN AMERICAN: 46 mL/min — AB (ref >60)
GFR, EST NON AFRICAN AMERICAN: 39 mL/min — AB (ref >60)
Glucose, Bld: 93 mg/dL (ref 70–99)
Potassium: 3.8 mmol/L (ref 3.5–5.1)
SODIUM: 139 mmol/L (ref 135–145)

## 2018-04-08 LAB — CBC
HCT: 38.4 % (ref 36.0–46.0)
Hemoglobin: 11.7 g/dL — ABNORMAL LOW (ref 12.0–15.0)
MCH: 27 pg (ref 26.0–34.0)
MCHC: 30.5 g/dL (ref 30.0–36.0)
MCV: 88.5 fL (ref 78.0–100.0)
PLATELETS: 217 10*3/uL (ref 150–400)
RBC: 4.34 MIL/uL (ref 3.87–5.11)
RDW: 20.7 % — AB (ref 11.5–15.5)
WBC: 8.5 10*3/uL (ref 4.0–10.5)

## 2018-04-08 NOTE — ED Triage Notes (Signed)
Pt's home health RN noted pt's bp to be elevated at home today. Pt c/o headache with blurred vision

## 2018-04-09 ENCOUNTER — Emergency Department (HOSPITAL_COMMUNITY): Payer: Medicare Other

## 2018-04-09 DIAGNOSIS — R51 Headache: Secondary | ICD-10-CM | POA: Diagnosis not present

## 2018-04-09 DIAGNOSIS — H43811 Vitreous degeneration, right eye: Secondary | ICD-10-CM | POA: Diagnosis not present

## 2018-04-09 DIAGNOSIS — I10 Essential (primary) hypertension: Secondary | ICD-10-CM | POA: Diagnosis not present

## 2018-04-09 MED ORDER — LOSARTAN POTASSIUM 25 MG PO TABS: 25.0000 mg | ORAL_TABLET | Freq: Every day | ORAL | 0 refills | Status: DC

## 2018-04-09 MED ORDER — LABETALOL HCL 5 MG/ML IV SOLN
10.0000 mg | Freq: Once | INTRAVENOUS | Status: AC
  Administered 2018-04-09: 10 mg via INTRAVENOUS
  Filled 2018-04-09: qty 4

## 2018-04-09 MED ORDER — LOSARTAN POTASSIUM 25 MG PO TABS
25.0000 mg | ORAL_TABLET | Freq: Once | ORAL | Status: AC
  Administered 2018-04-09: 25 mg via ORAL
  Filled 2018-04-09: qty 1

## 2018-04-09 MED ORDER — LABETALOL HCL 5 MG/ML IV SOLN
10.0000 mg | Freq: Once | INTRAVENOUS | Status: AC
Start: 2018-04-09 — End: 2018-04-09
  Administered 2018-04-09: 10 mg via INTRAVENOUS
  Filled 2018-04-09: qty 4

## 2018-04-09 NOTE — ED Notes (Signed)
Patient up to bathroom. No distress observed.

## 2018-04-09 NOTE — ED Notes (Signed)
PT states understanding of care given, follow up care, and medication prescribed. PT ambulated from ED to car with a steady gait. 

## 2018-04-09 NOTE — ED Provider Notes (Signed)
[facility] EMERGENCY DEPARTMENT Provider Note   CSN: [id number] Arrival date & time: 04/08/18  2218     History   Chief Complaint Chief Complaint  Patient presents with  . Hypertension    HPI [name] is a 72 y.o. female.  Patient presents to the emergency department for evaluation of elevated blood pressure.  Patient reports that she was recently hospitalized for an extended period of time secondary to ileostomy.  She has been home for several months.  She reports that during her hospitalization there was difficulty managing her blood pressure, blood pressure medications were changed.  She reports that she has been doing well, however, a visiting nurse checked her blood pressure this morning.  It was elevated.  Over the course of the day she developed headache and neck pain.  She has had some blurred vision.  Patient denies chest pain, shortness of breath.     Past Medical History:  Diagnosis Date  . Anxiety   . Arthropathy, unspecified, site unspecified   . Blind loop syndrome   . Carotid artery disease ([place])    documented occlusion of left ICA  . Coronary artery disease    a. known 60-70% mid-LAD stenosis with caths in 1999, 2002, 2006, and 2012 showing stable anatomy b. low-risk NST in 10/2015  . Depression   . Diabetes insipidus ([place])   . Diabetes mellitus   . Diverticulosis   . Dizziness    chronic  . Dyslipidemia   . Dyspnea    with exertion  . Fatty liver   . GERD (gastroesophageal reflux disease)    hiatial hernia  . Hepatomegaly   . Hiatal hernia   . HTN (hypertension)   . Hyperlipidemia   . Hypertension   . Irritable bowel syndrome   . Melanosis coli   . Metatarsal fracture right  . Normal nuclear stress test    nml 01/03/10;06/19/07  . Other, mixed, or unspecified nondependent drug abuse, unspecified   . Pancreatitis, chronic ([place])   . Peripheral vascular disease ([place]) 02/20/10   s/p stent of left SFA  . Sleep apnea   .  Stroke ([place])   . Thyroid nodule   . Unspecified essential hypertension   . Vitamin B12 deficiency     Patient Active Problem List   Diagnosis Date Noted  . Ileostomy in place [facility]) 10/23/2017  . Pressure injury of skin 10/19/2017  . 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  Ileostomy 10/18/2017 10/14/2017  . Ischemic colitis ([place]) 10/14/2017  . Acute respiratory failure with hypoxemia ([place]) 10/14/2017  . Acute post-operative pain 10/14/2017  . AKI (acute kidney injury) ([place]) 10/14/2017  . Anemia 10/14/2017  . Nocturia more than twice per night 05/07/2017  . Diabetes due to underlying condition w oth circulatory comp ([place]) 05/07/2017  . Atherosclerotic PVD with intermittent claudication ([place]) 05/07/2017  . COPD (chronic obstructive pulmonary disease) ([place]) 05/07/2017  . Poor sleep hygiene 05/07/2017  . 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 Hilar lymphadenopathy   . Hemoptysis 02/19/2017  . Pulmonary nodules/lesions, multiple 02/19/2017  . Tobacco use 02/19/2017  . RUQ abdominal pain 01/13/2017  . Pancreatic insufficiency 01/13/2017  . Left-sided extracranial carotid artery occlusion 11/26/2016  . PAOD (peripheral arterial occlusive disease) ([place]) 11/26/2016  . Encounter for routine follow-up after surgery of the circulatory system 11/26/2016  . Chest pain with moderate risk of acute coronary syndrome 10/27/2015  . Type 2 diabetes mellitus ([place]) 10/27/2015  . Occlusion and stenosis of  carotid artery without mention of cerebral infarction 04/14/2012  . Chronic pancreatitis ([place]) 04/30/2011  . Vitamin B12 deficiency 04/30/2011  . 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 Fatty liver 04/30/2011  . Obesity 04/30/2011  . Abdominal pain 04/09/2011  . Iron deficiency anemia, unspecified  04/09/2011  . Other general symptoms  04/09/2011  . DM (diabetes mellitus screen) 04/09/2011  . Asthma 04/09/2011  . Coronary artery disease   . Dyslipidemia   . Dizziness   .  Right-sided extracranial carotid artery stenosis   . Depression   . Peripheral vascular disease ([place])   . Normal nuclear stress test   . B12 DEFICIENCY 03/19/2010  . DVT 03/16/2010  . BLIND LOOP SYNDROME 03/16/2010  . DIARRHEA 03/16/2010  . LAXATIVE ABUSE 12/17/2007  . Essential hypertension 12/17/2007  . GERD 12/17/2007  . HIATAL HERNIA 12/17/2007  . IBS 12/17/2007  . MELANOSIS COLI 12/17/2007  . ARTHRITIS 12/17/2007  . HEPATOMEGALY 12/17/2007    Past Surgical History:  Procedure Laterality Date  . ABDOMINAL HYSTERECTOMY     partial  . AUGMENTATION MAMMAPLASTY     bilateral 1976 retro   . BREAST ENHANCEMENT SURGERY    . CARDIAC CATHETERIZATION  07/07/98;08/31/01;03/04/05   60 -70% LAD  . CATARACT EXTRACTION    . COLON RESECTION N/A 10/14/2017   Procedure: EXPLORATORY LAPAROTOMY, EXTENDED RIGHT COLECTOMY; APPLICATION OF ABDOMINAL VACUUM DRESSING;  Surgeon: Jovita Kussmaul, MD;  Location: [facility] ORS;  Service: General;  Laterality: N/A;  . ENDOBRONCHIAL ULTRASOUND Bilateral 02/24/2017   Procedure: ENDOBRONCHIAL ULTRASOUND;  Surgeon: Collene Gobble, MD;  Location: [facility] ENDOSCOPY;  Service: Cardiopulmonary;  Laterality: Bilateral;  . EYE SURGERY Left Nov. 2016   Cataract  . EYE SURGERY Right 2001   Cataract  . FEMORAL ARTERY STENT     Left leg  . LAPAROTOMY N/A 10/16/2017   Procedure: EXPLORATORY LAPAROTOMY, PARTIAL OMENTECTOMY, RESECTION ISCHEMIC ILEUM 962IW, APPLICATION OF VAC ABDOMINAL DRESSING;  Surgeon: Armandina Gemma, MD;  Location: [facility] ORS;  Service: General;  Laterality: N/A;  . LAPAROTOMY N/A 10/18/2017   Procedure: RE-EXPLORATION OF ABDOMEN, ILEOSTOMY CREATION;  Surgeon: Stark Klein, MD;  Location: [facility] ORS;  Service: General;  Laterality: N/A;  . LUNG SURGERY  03/2017   Benign polyps removed  . PATELLA REALIGNMENT Left   . TONSILLECTOMY    . TOTAL ABDOMINAL HYSTERECTOMY    . WRIST SURGERY     right     OB History   None      Home Medications    Prior to Admission  medications   Medication Sig Start Date End Date Taking? Authorizing Provider  amLODipine (NORVASC) 10 MG tablet Place 1 tablet (10 mg total) into feeding tube daily. 11/06/17   Florencia Reasons, MD  ARIPiprazole (ABILIFY) 5 MG tablet Place 1 tablet (5 mg total) into feeding tube daily. 11/06/17   Florencia Reasons, MD  losartan (COZAAR) 25 MG tablet Take 1 tablet (25 mg total) by mouth daily. 04/09/18   Orpah Greek, MD  metoprolol tartrate (LOPRESSOR) 50 MG tablet Place 1 tablet (50 mg total) into feeding tube 2 (two) times daily. 11/06/17   Florencia Reasons, MD  nitroGLYCERIN (NITROSTAT) 0.4 MG SL tablet Place 1 tablet (0.4 mg total) under the tongue every 5 (five) minutes as needed for chest pain. 11/08/15   [place], Peter M, MD  venlafaxine [facility]) 75 MG tablet Place 1 tablet (75 mg total) into feeding tube 2 (two) times daily with a meal. 11/06/17   Florencia Reasons, MD  Water  For Irrigation, Sterile (FREE WATER) SOLN Place 200 mLs into feeding tube every 6 (six) hours. 11/06/17   Florencia Reasons, MD  zolpidem (AMBIEN) 10 MG tablet Take 10 mg by mouth at bedtime as needed for sleep.    [provider]    Family History Family History  Problem Relation Age of Onset  . Hypertension Mother   . Osteoarthritis Mother   . Pulmonary embolism Mother   . Hypertension Father        aneurysm  . Stroke Father   . Stroke Unknown   . Colon cancer Paternal Grandfather     Social History Social History   Tobacco Use  . Smoking status: Former Smoker    Packs/day: 1.00    Years: 50.00    Pack years: 50.00    Types: Cigarettes    Last attempt to quit: 10/14/2010    Years since quitting: 7.4  . Smokeless tobacco: Never Used  Substance Use Topics  . Alcohol use: No  . Drug use: No     Allergies   Codeine and Iodinated diagnostic agents   Review of Systems Review of Systems  Eyes: Positive for visual disturbance.  Respiratory: Negative for shortness of breath.   Cardiovascular: Negative for chest pain.    Neurological: Positive for headaches.  All other systems reviewed and are negative.    Physical Exam Updated Vital Signs BP (!) 175/79   Pulse 86   Temp 98 F (36.7 C) (Oral)   Resp 20   SpO2 100%   Physical Exam  Constitutional: She is oriented to person, place, and time. She appears well-developed and well-nourished. No distress.  HENT:  Head: Normocephalic and atraumatic.  Right Ear: Hearing normal.  Left Ear: Hearing normal.  Nose: Nose normal.  Mouth/Throat: Oropharynx is clear and moist and mucous membranes are normal.  Eyes: Pupils are equal, round, and reactive to light. Conjunctivae and EOM are normal.  Neck: Normal range of motion. Neck supple.  Cardiovascular: Regular rhythm, S1 normal and S2 normal. Exam reveals no gallop and no friction rub.  No murmur heard. Pulmonary/Chest: Effort normal and breath sounds normal. No respiratory distress. She exhibits no tenderness.  Abdominal: Soft. Normal appearance and bowel sounds are normal. There is no hepatosplenomegaly. There is no tenderness. There is no rebound, no guarding, no tenderness at McBurney's point and negative Murphy's sign. No hernia.  Musculoskeletal: Normal range of motion.  Neurological: She is alert and oriented to person, place, and time. She has normal strength. No cranial nerve deficit or sensory deficit. Coordination normal. GCS eye subscore is 4. GCS verbal subscore is 5. GCS motor subscore is 6.  Skin: Skin is warm, dry and intact. No rash noted. No cyanosis.  Psychiatric: She has a normal mood and affect. Her speech is normal and behavior is normal. Thought content normal.  Nursing note and vitals reviewed.    ED Treatments / Results  Labs (all labs ordered are listed, but only abnormal results are displayed) Labs Reviewed  BASIC METABOLIC PANEL - Abnormal; Notable for the following components:      Result Value   Creatinine, Ser 1.33 (*)    GFR calc non Af Amer 39 (*)    GFR calc Af Amer 46  (*)    All other components within normal limits  CBC - Abnormal; Notable for the following components:   Hemoglobin 11.7 (*)    RDW 20.7 (*)    All other components within normal limits  EKG EKG Interpretation  Date/Time:  Thursday April 09 2018 00:29:47 EDT Ventricular Rate:  70 PR Interval:    QRS Duration: 103 QT Interval:  416 QTC Calculation: 449 R Axis:   72 Text Interpretation:  Sinus rhythm Left atrial enlargement Confirmed by Orpah Greek [phone]) on 04/09/2018 12:40:25 AM   Radiology Ct Head Wo Contrast  Result Date: 04/09/2018 CLINICAL DATA:  Hypertension and headache with blurry vision. EXAM: CT HEAD WITHOUT CONTRAST TECHNIQUE: Contiguous axial images were obtained from the base of the skull through the vertex without intravenous contrast. COMPARISON:  10/24/2017 MRI and CT FINDINGS: Brain: Superficial atrophy. No hydrocephalus. No large vascular territory infarction. Chronic infarct left parietal white matter. No acute intracranial hemorrhage, midline shift or edema. No intra-axial mass or extra-axial fluid collections. Chronic minimal small vessel ischemia periventricular white matter. Vascular: No hyperdense vessel sign. Skull: No acute skull fracture or suspicious osseous lesions. Sinuses/Orbits: No acute finding. Other: None. IMPRESSION: Remote left posterior parietal white matter infarct. Chronic small vessel ischemic disease and atrophy. No acute intracranial abnormality. Electronically Signed   By: Ashley Royalty M.D.   On: 04/09/2018 02:39    Procedures Procedures (including critical care time)  Medications Ordered in ED Medications  labetalol (NORMODYNE,TRANDATE) injection 10 mg (has no administration in time range)  losartan (COZAAR) tablet 25 mg (has no administration in time range)  labetalol (NORMODYNE,TRANDATE) injection 10 mg (10 mg Intravenous Given 04/09/18 0122)     Initial Impression / Assessment and Plan / ED Course  I have reviewed the  triage vital signs and the nursing notes.  Pertinent labs & imaging results that were available during my care of the patient were reviewed by me and considered in my medical decision making (see chart for details).     Patient presents to the emergency department for evaluation of elevated blood pressure.  Patient reports that she has a history of uncontrolled blood pressure she was in the hospital in January of this year.  Medications were adjusted and she has been doing well.  Blood pressure was elevated this morning when it was checked by her weekly visiting nurse.  Over the course of the day she noticed some vision change in headache, so she presented to the ER.  She has a normal neurologic exam here in the ER.  No focal deficits.  No meningismus.  No fever.  CT head unremarkable.  Lab work unremarkable.  Patient did have significant response to labetalol, but blood pressure started to come back up again as the IV medication wore off.  Labetalol redosed, will initiate losartan.  She does have a follow-up appointment with her doctor tomorrow.   Final Clinical Impressions(s) / ED Diagnoses   Final diagnoses:  Essential hypertension    ED Discharge Orders        Ordered    losartan (COZAAR) 25 MG tablet  Daily     04/09/18 0304       Orpah Greek, MD 04/09/18 [id number]

## 2018-04-09 NOTE — ED Notes (Signed)
Patient transported to CT 

## 2018-04-10 DIAGNOSIS — I10 Essential (primary) hypertension: Secondary | ICD-10-CM | POA: Diagnosis not present

## 2018-04-10 DIAGNOSIS — Z932 Ileostomy status: Secondary | ICD-10-CM | POA: Diagnosis not present

## 2018-04-10 DIAGNOSIS — R000 Tachycardia, unspecified: Secondary | ICD-10-CM | POA: Diagnosis not present

## 2018-04-10 DIAGNOSIS — D509 Iron deficiency anemia, unspecified: Secondary | ICD-10-CM | POA: Diagnosis not present

## 2018-04-10 DIAGNOSIS — N183 Chronic kidney disease, stage 3 (moderate): Secondary | ICD-10-CM | POA: Diagnosis not present

## 2018-04-15 DIAGNOSIS — K140 Glossitis: Secondary | ICD-10-CM | POA: Diagnosis not present

## 2018-04-15 DIAGNOSIS — K146 Glossodynia: Secondary | ICD-10-CM | POA: Insufficient documentation

## 2018-04-20 ENCOUNTER — Other Ambulatory Visit: Payer: Self-pay | Admitting: Internal Medicine

## 2018-04-20 DIAGNOSIS — Z1231 Encounter for screening mammogram for malignant neoplasm of breast: Secondary | ICD-10-CM

## 2018-04-23 DIAGNOSIS — J9611 Chronic respiratory failure with hypoxia: Secondary | ICD-10-CM | POA: Diagnosis not present

## 2018-04-23 DIAGNOSIS — Z432 Encounter for attention to ileostomy: Secondary | ICD-10-CM | POA: Diagnosis not present

## 2018-04-23 DIAGNOSIS — I10 Essential (primary) hypertension: Secondary | ICD-10-CM | POA: Diagnosis not present

## 2018-04-23 DIAGNOSIS — E1151 Type 2 diabetes mellitus with diabetic peripheral angiopathy without gangrene: Secondary | ICD-10-CM | POA: Diagnosis not present

## 2018-04-23 DIAGNOSIS — I69354 Hemiplegia and hemiparesis following cerebral infarction affecting left non-dominant side: Secondary | ICD-10-CM | POA: Diagnosis not present

## 2018-04-23 DIAGNOSIS — J449 Chronic obstructive pulmonary disease, unspecified: Secondary | ICD-10-CM | POA: Diagnosis not present

## 2018-04-27 DIAGNOSIS — I69354 Hemiplegia and hemiparesis following cerebral infarction affecting left non-dominant side: Secondary | ICD-10-CM | POA: Diagnosis not present

## 2018-04-27 DIAGNOSIS — I69392 Facial weakness following cerebral infarction: Secondary | ICD-10-CM | POA: Diagnosis not present

## 2018-04-27 DIAGNOSIS — I69315 Cognitive social or emotional deficit following cerebral infarction: Secondary | ICD-10-CM | POA: Diagnosis not present

## 2018-04-27 DIAGNOSIS — I10 Essential (primary) hypertension: Secondary | ICD-10-CM | POA: Diagnosis not present

## 2018-04-27 DIAGNOSIS — J9611 Chronic respiratory failure with hypoxia: Secondary | ICD-10-CM | POA: Diagnosis not present

## 2018-04-27 DIAGNOSIS — E1151 Type 2 diabetes mellitus with diabetic peripheral angiopathy without gangrene: Secondary | ICD-10-CM | POA: Diagnosis not present

## 2018-04-27 DIAGNOSIS — I2510 Atherosclerotic heart disease of native coronary artery without angina pectoris: Secondary | ICD-10-CM | POA: Diagnosis not present

## 2018-04-27 DIAGNOSIS — Z432 Encounter for attention to ileostomy: Secondary | ICD-10-CM | POA: Diagnosis not present

## 2018-04-27 DIAGNOSIS — J449 Chronic obstructive pulmonary disease, unspecified: Secondary | ICD-10-CM | POA: Diagnosis not present

## 2018-04-29 DIAGNOSIS — M79672 Pain in left foot: Secondary | ICD-10-CM | POA: Diagnosis not present

## 2018-04-29 DIAGNOSIS — I70293 Other atherosclerosis of native arteries of extremities, bilateral legs: Secondary | ICD-10-CM | POA: Diagnosis not present

## 2018-04-29 DIAGNOSIS — M79671 Pain in right foot: Secondary | ICD-10-CM | POA: Diagnosis not present

## 2018-05-05 DIAGNOSIS — I10 Essential (primary) hypertension: Secondary | ICD-10-CM | POA: Diagnosis not present

## 2018-05-05 DIAGNOSIS — E1151 Type 2 diabetes mellitus with diabetic peripheral angiopathy without gangrene: Secondary | ICD-10-CM | POA: Diagnosis not present

## 2018-05-05 DIAGNOSIS — J9611 Chronic respiratory failure with hypoxia: Secondary | ICD-10-CM | POA: Diagnosis not present

## 2018-05-05 DIAGNOSIS — I69354 Hemiplegia and hemiparesis following cerebral infarction affecting left non-dominant side: Secondary | ICD-10-CM | POA: Diagnosis not present

## 2018-05-05 DIAGNOSIS — J449 Chronic obstructive pulmonary disease, unspecified: Secondary | ICD-10-CM | POA: Diagnosis not present

## 2018-05-05 DIAGNOSIS — Z432 Encounter for attention to ileostomy: Secondary | ICD-10-CM | POA: Diagnosis not present

## 2018-05-12 DIAGNOSIS — J9611 Chronic respiratory failure with hypoxia: Secondary | ICD-10-CM | POA: Diagnosis not present

## 2018-05-12 DIAGNOSIS — J449 Chronic obstructive pulmonary disease, unspecified: Secondary | ICD-10-CM | POA: Diagnosis not present

## 2018-05-12 DIAGNOSIS — Z432 Encounter for attention to ileostomy: Secondary | ICD-10-CM | POA: Diagnosis not present

## 2018-05-12 DIAGNOSIS — E1151 Type 2 diabetes mellitus with diabetic peripheral angiopathy without gangrene: Secondary | ICD-10-CM | POA: Diagnosis not present

## 2018-05-12 DIAGNOSIS — I10 Essential (primary) hypertension: Secondary | ICD-10-CM | POA: Diagnosis not present

## 2018-05-12 DIAGNOSIS — I69354 Hemiplegia and hemiparesis following cerebral infarction affecting left non-dominant side: Secondary | ICD-10-CM | POA: Diagnosis not present

## 2018-05-13 ENCOUNTER — Ambulatory Visit
Admission: RE | Admit: 2018-05-13 | Discharge: 2018-05-13 | Disposition: A | Discharge status: Home / Self Care | Payer: Medicare Other | Source: Ambulatory Visit | Attending: Internal Medicine | Admitting: Internal Medicine

## 2018-05-13 DIAGNOSIS — Z1231 Encounter for screening mammogram for malignant neoplasm of breast: Secondary | ICD-10-CM | POA: Diagnosis not present

## 2018-05-19 DIAGNOSIS — I10 Essential (primary) hypertension: Secondary | ICD-10-CM | POA: Diagnosis not present

## 2018-05-19 DIAGNOSIS — J449 Chronic obstructive pulmonary disease, unspecified: Secondary | ICD-10-CM | POA: Diagnosis not present

## 2018-05-19 DIAGNOSIS — Z432 Encounter for attention to ileostomy: Secondary | ICD-10-CM | POA: Diagnosis not present

## 2018-05-19 DIAGNOSIS — J9611 Chronic respiratory failure with hypoxia: Secondary | ICD-10-CM | POA: Diagnosis not present

## 2018-05-19 DIAGNOSIS — I69354 Hemiplegia and hemiparesis following cerebral infarction affecting left non-dominant side: Secondary | ICD-10-CM | POA: Diagnosis not present

## 2018-05-19 DIAGNOSIS — E1151 Type 2 diabetes mellitus with diabetic peripheral angiopathy without gangrene: Secondary | ICD-10-CM | POA: Diagnosis not present

## 2018-06-01 DIAGNOSIS — M2460 Ankylosis, unspecified joint: Secondary | ICD-10-CM | POA: Diagnosis not present

## 2018-06-01 DIAGNOSIS — K140 Glossitis: Secondary | ICD-10-CM | POA: Diagnosis not present

## 2018-06-01 DIAGNOSIS — Z87891 Personal history of nicotine dependence: Secondary | ICD-10-CM | POA: Diagnosis not present

## 2018-06-01 DIAGNOSIS — R471 Dysarthria and anarthria: Secondary | ICD-10-CM | POA: Diagnosis not present

## 2018-06-01 DIAGNOSIS — Z8673 Personal history of transient ischemic attack (TIA), and cerebral infarction without residual deficits: Secondary | ICD-10-CM | POA: Diagnosis not present

## 2018-06-02 DIAGNOSIS — E1151 Type 2 diabetes mellitus with diabetic peripheral angiopathy without gangrene: Secondary | ICD-10-CM | POA: Diagnosis not present

## 2018-06-02 DIAGNOSIS — I69354 Hemiplegia and hemiparesis following cerebral infarction affecting left non-dominant side: Secondary | ICD-10-CM | POA: Diagnosis not present

## 2018-06-02 DIAGNOSIS — J9611 Chronic respiratory failure with hypoxia: Secondary | ICD-10-CM | POA: Diagnosis not present

## 2018-06-02 DIAGNOSIS — I10 Essential (primary) hypertension: Secondary | ICD-10-CM | POA: Diagnosis not present

## 2018-06-02 DIAGNOSIS — J449 Chronic obstructive pulmonary disease, unspecified: Secondary | ICD-10-CM | POA: Diagnosis not present

## 2018-06-02 DIAGNOSIS — Z432 Encounter for attention to ileostomy: Secondary | ICD-10-CM | POA: Diagnosis not present

## 2018-06-03 DIAGNOSIS — I69315 Cognitive social or emotional deficit following cerebral infarction: Secondary | ICD-10-CM | POA: Diagnosis not present

## 2018-06-03 DIAGNOSIS — D649 Anemia, unspecified: Secondary | ICD-10-CM | POA: Diagnosis not present

## 2018-06-03 DIAGNOSIS — E1151 Type 2 diabetes mellitus with diabetic peripheral angiopathy without gangrene: Secondary | ICD-10-CM | POA: Diagnosis not present

## 2018-06-03 DIAGNOSIS — Z7902 Long term (current) use of antithrombotics/antiplatelets: Secondary | ICD-10-CM | POA: Diagnosis not present

## 2018-06-03 DIAGNOSIS — J449 Chronic obstructive pulmonary disease, unspecified: Secondary | ICD-10-CM | POA: Diagnosis not present

## 2018-06-03 DIAGNOSIS — D6489 Other specified anemias: Secondary | ICD-10-CM | POA: Diagnosis not present

## 2018-06-03 DIAGNOSIS — E785 Hyperlipidemia, unspecified: Secondary | ICD-10-CM | POA: Diagnosis not present

## 2018-06-03 DIAGNOSIS — J9611 Chronic respiratory failure with hypoxia: Secondary | ICD-10-CM | POA: Diagnosis not present

## 2018-06-03 DIAGNOSIS — E46 Unspecified protein-calorie malnutrition: Secondary | ICD-10-CM | POA: Diagnosis not present

## 2018-06-03 DIAGNOSIS — K219 Gastro-esophageal reflux disease without esophagitis: Secondary | ICD-10-CM | POA: Diagnosis not present

## 2018-06-03 DIAGNOSIS — I10 Essential (primary) hypertension: Secondary | ICD-10-CM | POA: Diagnosis not present

## 2018-06-03 DIAGNOSIS — F329 Major depressive disorder, single episode, unspecified: Secondary | ICD-10-CM | POA: Diagnosis not present

## 2018-06-03 DIAGNOSIS — F419 Anxiety disorder, unspecified: Secondary | ICD-10-CM | POA: Diagnosis not present

## 2018-06-03 DIAGNOSIS — M1990 Unspecified osteoarthritis, unspecified site: Secondary | ICD-10-CM | POA: Diagnosis not present

## 2018-06-03 DIAGNOSIS — I69354 Hemiplegia and hemiparesis following cerebral infarction affecting left non-dominant side: Secondary | ICD-10-CM | POA: Diagnosis not present

## 2018-06-03 DIAGNOSIS — K589 Irritable bowel syndrome without diarrhea: Secondary | ICD-10-CM | POA: Diagnosis not present

## 2018-06-03 DIAGNOSIS — Z432 Encounter for attention to ileostomy: Secondary | ICD-10-CM | POA: Diagnosis not present

## 2018-06-03 DIAGNOSIS — I2510 Atherosclerotic heart disease of native coronary artery without angina pectoris: Secondary | ICD-10-CM | POA: Diagnosis not present

## 2018-06-03 DIAGNOSIS — I69392 Facial weakness following cerebral infarction: Secondary | ICD-10-CM | POA: Diagnosis not present

## 2018-06-03 DIAGNOSIS — F17210 Nicotine dependence, cigarettes, uncomplicated: Secondary | ICD-10-CM | POA: Diagnosis not present

## 2018-06-03 DIAGNOSIS — F5103 Paradoxical insomnia: Secondary | ICD-10-CM | POA: Diagnosis not present

## 2018-06-03 DIAGNOSIS — F3289 Other specified depressive episodes: Secondary | ICD-10-CM | POA: Diagnosis not present

## 2018-06-03 DIAGNOSIS — Z9181 History of falling: Secondary | ICD-10-CM | POA: Diagnosis not present

## 2018-06-03 DIAGNOSIS — R1312 Dysphagia, oropharyngeal phase: Secondary | ICD-10-CM | POA: Diagnosis not present

## 2018-06-08 DIAGNOSIS — K6289 Other specified diseases of anus and rectum: Secondary | ICD-10-CM | POA: Diagnosis not present

## 2018-06-08 DIAGNOSIS — Z6827 Body mass index (BMI) 27.0-27.9, adult: Secondary | ICD-10-CM | POA: Diagnosis not present

## 2018-06-08 DIAGNOSIS — R1031 Right lower quadrant pain: Secondary | ICD-10-CM | POA: Diagnosis not present

## 2018-06-08 DIAGNOSIS — K629 Disease of anus and rectum, unspecified: Secondary | ICD-10-CM | POA: Insufficient documentation

## 2018-06-08 DIAGNOSIS — Z932 Ileostomy status: Secondary | ICD-10-CM | POA: Diagnosis not present

## 2018-06-10 DIAGNOSIS — Z432 Encounter for attention to ileostomy: Secondary | ICD-10-CM | POA: Diagnosis not present

## 2018-06-10 DIAGNOSIS — I10 Essential (primary) hypertension: Secondary | ICD-10-CM | POA: Diagnosis not present

## 2018-06-10 DIAGNOSIS — J9611 Chronic respiratory failure with hypoxia: Secondary | ICD-10-CM | POA: Diagnosis not present

## 2018-06-10 DIAGNOSIS — E1151 Type 2 diabetes mellitus with diabetic peripheral angiopathy without gangrene: Secondary | ICD-10-CM | POA: Diagnosis not present

## 2018-06-10 DIAGNOSIS — I69354 Hemiplegia and hemiparesis following cerebral infarction affecting left non-dominant side: Secondary | ICD-10-CM | POA: Diagnosis not present

## 2018-06-10 DIAGNOSIS — J449 Chronic obstructive pulmonary disease, unspecified: Secondary | ICD-10-CM | POA: Diagnosis not present

## 2018-06-16 DIAGNOSIS — R1312 Dysphagia, oropharyngeal phase: Secondary | ICD-10-CM | POA: Diagnosis not present

## 2018-06-17 ENCOUNTER — Telehealth: Payer: Self-pay | Admitting: Gastroenterology

## 2018-06-17 DIAGNOSIS — I69354 Hemiplegia and hemiparesis following cerebral infarction affecting left non-dominant side: Secondary | ICD-10-CM | POA: Diagnosis not present

## 2018-06-17 DIAGNOSIS — J9611 Chronic respiratory failure with hypoxia: Secondary | ICD-10-CM | POA: Diagnosis not present

## 2018-06-17 DIAGNOSIS — I10 Essential (primary) hypertension: Secondary | ICD-10-CM | POA: Diagnosis not present

## 2018-06-17 DIAGNOSIS — J449 Chronic obstructive pulmonary disease, unspecified: Secondary | ICD-10-CM | POA: Diagnosis not present

## 2018-06-17 DIAGNOSIS — E1151 Type 2 diabetes mellitus with diabetic peripheral angiopathy without gangrene: Secondary | ICD-10-CM | POA: Diagnosis not present

## 2018-06-17 DIAGNOSIS — Z432 Encounter for attention to ileostomy: Secondary | ICD-10-CM | POA: Diagnosis not present

## 2018-06-17 NOTE — Telephone Encounter (Signed)
I spoke with Lavelle, I explained that patient has not been seen since last July.  She will have the patient call for an appt with an APP

## 2018-06-17 NOTE — Telephone Encounter (Signed)
Left message for patient to call back  

## 2018-06-23 DIAGNOSIS — J449 Chronic obstructive pulmonary disease, unspecified: Secondary | ICD-10-CM | POA: Diagnosis not present

## 2018-06-23 DIAGNOSIS — I69354 Hemiplegia and hemiparesis following cerebral infarction affecting left non-dominant side: Secondary | ICD-10-CM | POA: Diagnosis not present

## 2018-06-23 DIAGNOSIS — Z432 Encounter for attention to ileostomy: Secondary | ICD-10-CM | POA: Diagnosis not present

## 2018-06-23 DIAGNOSIS — I10 Essential (primary) hypertension: Secondary | ICD-10-CM | POA: Diagnosis not present

## 2018-06-23 DIAGNOSIS — J9611 Chronic respiratory failure with hypoxia: Secondary | ICD-10-CM | POA: Diagnosis not present

## 2018-06-23 DIAGNOSIS — E1151 Type 2 diabetes mellitus with diabetic peripheral angiopathy without gangrene: Secondary | ICD-10-CM | POA: Diagnosis not present

## 2018-06-24 DIAGNOSIS — Z23 Encounter for immunization: Secondary | ICD-10-CM | POA: Diagnosis not present

## 2018-06-30 ENCOUNTER — Ambulatory Visit: Payer: Medicaid Other | Admitting: Nurse Practitioner

## 2018-06-30 DIAGNOSIS — J449 Chronic obstructive pulmonary disease, unspecified: Secondary | ICD-10-CM | POA: Diagnosis not present

## 2018-06-30 DIAGNOSIS — J9611 Chronic respiratory failure with hypoxia: Secondary | ICD-10-CM | POA: Diagnosis not present

## 2018-06-30 DIAGNOSIS — E1151 Type 2 diabetes mellitus with diabetic peripheral angiopathy without gangrene: Secondary | ICD-10-CM | POA: Diagnosis not present

## 2018-06-30 DIAGNOSIS — I10 Essential (primary) hypertension: Secondary | ICD-10-CM | POA: Diagnosis not present

## 2018-06-30 DIAGNOSIS — I69354 Hemiplegia and hemiparesis following cerebral infarction affecting left non-dominant side: Secondary | ICD-10-CM | POA: Diagnosis not present

## 2018-06-30 DIAGNOSIS — Z432 Encounter for attention to ileostomy: Secondary | ICD-10-CM | POA: Diagnosis not present

## 2018-07-07 ENCOUNTER — Ambulatory Visit (INDEPENDENT_AMBULATORY_CARE_PROVIDER_SITE_OTHER): Payer: Medicare Other | Admitting: Nurse Practitioner

## 2018-07-07 ENCOUNTER — Encounter: Payer: Self-pay | Admitting: Nurse Practitioner

## 2018-07-07 ENCOUNTER — Encounter (INDEPENDENT_AMBULATORY_CARE_PROVIDER_SITE_OTHER): Payer: Self-pay

## 2018-07-07 VITALS — BP 148/60 | HR 60 | Ht 63.0 in | Wt 141.0 lb

## 2018-07-07 DIAGNOSIS — R198 Other specified symptoms and signs involving the digestive system and abdomen: Secondary | ICD-10-CM

## 2018-07-07 NOTE — Patient Instructions (Signed)
Will call in prescription to pharmacy later today. Call after completing antibiotic to give an update.  If you are age 72 or older, your body mass index should be between 23-30. Your Body mass index is 24.98 kg/m. If this is out of the aforementioned range listed, please consider follow up with your Primary Care Provider.  If you are age 58 or younger, your body mass index should be between 19-25. Your Body mass index is 24.98 kg/m. If this is out of the aformentioned range listed, please consider follow up with your Primary Care Provider.   Thank you for choosing me and [facility].   Tye Savoy, NP

## 2018-07-07 NOTE — Progress Notes (Signed)
Primary GI:  Joanna Edward, MD    Chief Complaint:    Rectal discharge  IMPRESSION and PLAN:    72 year old female status post extended right hemicolectomy with end ileostomy for ischemic colitis in January 2019.   She is here with complaints of rectal discharge, present since bowel surgery -Discharge is clear, jellylike, non-bloody making diversion colitis seem unlikely. Physiologic mucoid discharge? Patient disturbed by drainage, especially since it is malodorous. She would s interested in any treatment.  -Will try VSL #3,  900 billion one capsule BID for 4-5 weeks for ? Diversion colitis.  She will call in a month of so with update. Further recommendations pending clinical course.   2.  History of adenomatous colon polyps without high-grade dysplasia.  She had a large right-sided colon removed piecemeal fashion July 2018.  She was to come back in 6 months for follow-up colonoscopy but in the interim underwent extended right hemicolectomy for ischemic colitis. -Given right hemicolectomy with a large polyp was removed there is no need to repeat colonoscopy at this time.  She did have left-sided adenomas as well and will need surveillance colonoscopy at some point.  Will give her new colonoscopy recall date   3. Pancreatitis with pancreatic insufficiency -hasn't taken taken pancreatic enzymes in over 2 years.   HPI:     Patient is a 72 year old female with multiple medical problems not limited to DM, CAD, PVD D, chronic pancreatitis/chronic insufficiency.  I saw her July 2018 for evaluation of positive IFOB absence of overt GI bleeding or other alarming symptoms.  January 2019 she underwent exploratory laparotomy with extended right colectomy with end proximal ileostomyfor ischemic colitis.  [facility]et is here today to discuss some rectal discharge she is been having since bowel surgery.  Discharge is not every day but it is malodorous and sometimes leaks through her close.  The discharge is  clear, jellylike no fecal contents.  Her ileostomy output is normal ranging from liquid to semi-formed.  She has no abdominal pain nor fevers.  Colonoscopy July 2018 - A 14 mm polyp was found in the appendiceal orifice. The polyp was sessile. The polyp was removed with a piecemeal technique using a hot snare. Resection and retrieval were complete. - A 12 mm polyp was found in the cecum. The polyp was sessile. The polyp was removed with a piecemeal technique using a hot snare. Resection and retrieval were complete. - Two sessile polyps were found in the sigmoid colon. The polyps were 5 to 8 mm in size. These polyps were removed with a cold snare. Resection and retrieval were complete. - Multiple medium-mouthed diverticula were found in the left colon. There was narrowing of the colon in association with the diverticular opening. There was evidence of diverticular spasm. There was no evidence of diverticular bleeding.  Review of systems:     No chest pain, no SOB, no fevers, no urinary sx   Past Medical History:  Diagnosis Date  . Anxiety   . Arthropathy, unspecified, site unspecified   . Blind loop syndrome   . Carotid artery disease ([place])    documented occlusion of left ICA  . Coronary artery disease    a. known 60-70% mid-LAD stenosis with caths in 1999, 2002, 2006, and 2012 showing stable anatomy b. low-risk NST in 10/2015  . Depression   . Diabetes insipidus ([place])   . Diabetes mellitus   . Diverticulosis   . Dizziness    chronic  . Dyslipidemia   .  Dyspnea    with exertion  . Fatty liver   . GERD (gastroesophageal reflux disease)    hiatial hernia  . Hepatomegaly   . Hiatal hernia   . HTN (hypertension)   . Hyperlipidemia   . Hypertension   . Irritable bowel syndrome   . Melanosis coli   . Metatarsal fracture right  . Normal nuclear stress test    nml 01/03/10;06/19/07  . Other, mixed, or unspecified nondependent drug abuse, unspecified   . Pancreatitis, chronic ([place])    . Peripheral vascular disease ([place]) 02/20/10   s/p stent of left SFA  . Sleep apnea   . Stroke ([place])   . Thyroid nodule   . Unspecified essential hypertension   . Vitamin B12 deficiency     Patient's surgical history, family medical history, social history, medications and allergies were all reviewed in Epic   Creatinine clearance cannot be calculated (Patient's most recent lab result is older than the maximum 21 days allowed.)  Current Outpatient Medications  Medication Sig Dispense Refill  . amLODipine (NORVASC) 10 MG tablet Place 1 tablet (10 mg total) into feeding tube daily. 30 tablet 0  . ARIPiprazole (ABILIFY) 5 MG tablet Place 1 tablet (5 mg total) into feeding tube daily. 30 tablet 0  . losartan (COZAAR) 25 MG tablet Take 1 tablet (25 mg total) by mouth daily. 30 tablet 0  . metoprolol tartrate (LOPRESSOR) 50 MG tablet Place 1 tablet (50 mg total) into feeding tube 2 (two) times daily. 60 tablet 0  . nitroGLYCERIN (NITROSTAT) 0.4 MG SL tablet Place 1 tablet (0.4 mg total) under the tongue every 5 (five) minutes as needed for chest pain. 90 tablet 3  . venlafaxine (EFFEXOR) 75 MG tablet Place 1 tablet (75 mg total) into feeding tube 2 (two) times daily with a meal. 60 tablet 0  . Water For Irrigation, Sterile (FREE WATER) SOLN Place 200 mLs into feeding tube every 6 (six) hours. 1000 mL 0  . zolpidem (AMBIEN) 10 MG tablet Take 10 mg by mouth at bedtime as needed for sleep.     No current facility-administered medications for this visit.     Physical Exam:     BP (!) 148/60   Pulse 60   Ht 5\' 3"  (1.6 m)   Wt 141 lb (64 kg)   BMI 24.98 kg/m   GENERAL:  Pleasant female in NAD PSYCH: : Cooperative, normal affect EENT:  conjunctiva pink, mucous membranes moist, neck supple without masses CARDIAC:  RRR, no murmur heard, no peripheral edema PULM: Normal respiratory effort, lungs CTA bilaterally, no wheezing ABDOMEN:  Nondistended, soft, nontender. No obvious masses, no  hepatomegaly,  normal bowel sounds. lleostomy present SKIN:  turgor, no lesions seen Musculoskeletal:  Normal muscle tone, normal strength NEURO: Alert and oriented x 3, no focal neurologic deficits   Tye Savoy , NP 07/07/2018, 10:04 AM

## 2018-07-08 ENCOUNTER — Other Ambulatory Visit: Payer: Self-pay

## 2018-07-08 MED ORDER — VSL#3 PO PACK: PACK | ORAL | 3 refills | Status: DC

## 2018-07-08 NOTE — Progress Notes (Signed)
Reviewed and agree with management plan except would use SCFA enemas or 5-ASA enemas to treat for possible diversion colitis. Oral, luminally active therapies, such as VSL #3, will exit her ileostomy, will not reach the bypassed colon.  [name]. Fuller Plan, MD [facility]

## 2018-07-10 ENCOUNTER — Other Ambulatory Visit: Payer: Self-pay

## 2018-07-10 ENCOUNTER — Telehealth: Payer: Self-pay | Admitting: Nurse Practitioner

## 2018-07-10 MED ORDER — VSL#3 PO PACK: PACK | ORAL | 3 refills | Status: DC

## 2018-07-10 NOTE — Telephone Encounter (Signed)
Patient has questions regarding how to take her probiotic.

## 2018-07-13 ENCOUNTER — Telehealth: Payer: Self-pay

## 2018-07-13 NOTE — Telephone Encounter (Signed)
Called patient. Left a voice mail stating to take Probiotic as it states on bottle.  If she has further questions, please give me a call back.

## 2018-07-27 ENCOUNTER — Encounter (HOSPITAL_COMMUNITY): Payer: Medicaid Other

## 2018-07-29 ENCOUNTER — Encounter (HOSPITAL_COMMUNITY): Payer: Medicaid Other

## 2018-07-31 ENCOUNTER — Other Ambulatory Visit (HOSPITAL_COMMUNITY): Payer: Medicaid Other

## 2018-07-31 ENCOUNTER — Ambulatory Visit: Payer: Medicaid Other | Admitting: Family

## 2018-08-05 ENCOUNTER — Other Ambulatory Visit: Payer: Self-pay

## 2018-08-05 ENCOUNTER — Encounter (HOSPITAL_COMMUNITY): Payer: Self-pay

## 2018-08-05 ENCOUNTER — Emergency Department (HOSPITAL_COMMUNITY)
Admission: EM | Admit: 2018-08-05 | Discharge: 2018-08-05 | Disposition: A | Discharge status: Home / Self Care | Payer: Medicare Other | Attending: Emergency Medicine | Admitting: Emergency Medicine

## 2018-08-05 DIAGNOSIS — T83098A Other mechanical complication of other indwelling urethral catheter, initial encounter: Secondary | ICD-10-CM | POA: Insufficient documentation

## 2018-08-05 DIAGNOSIS — I2510 Atherosclerotic heart disease of native coronary artery without angina pectoris: Secondary | ICD-10-CM | POA: Diagnosis not present

## 2018-08-05 DIAGNOSIS — Z79899 Other long term (current) drug therapy: Secondary | ICD-10-CM | POA: Diagnosis not present

## 2018-08-05 DIAGNOSIS — I6521 Occlusion and stenosis of right carotid artery: Secondary | ICD-10-CM

## 2018-08-05 DIAGNOSIS — Z432 Encounter for attention to ileostomy: Secondary | ICD-10-CM | POA: Diagnosis not present

## 2018-08-05 DIAGNOSIS — E1159 Type 2 diabetes mellitus with other circulatory complications: Secondary | ICD-10-CM | POA: Diagnosis not present

## 2018-08-05 DIAGNOSIS — K9419 Other complications of enterostomy: Secondary | ICD-10-CM | POA: Diagnosis not present

## 2018-08-05 DIAGNOSIS — J449 Chronic obstructive pulmonary disease, unspecified: Secondary | ICD-10-CM | POA: Insufficient documentation

## 2018-08-05 DIAGNOSIS — Y731 Therapeutic (nonsurgical) and rehabilitative gastroenterology and urology devices associated with adverse incidents: Secondary | ICD-10-CM | POA: Diagnosis not present

## 2018-08-05 DIAGNOSIS — Z959 Presence of cardiac and vascular implant and graft, unspecified: Secondary | ICD-10-CM

## 2018-08-05 DIAGNOSIS — I6522 Occlusion and stenosis of left carotid artery: Secondary | ICD-10-CM

## 2018-08-05 DIAGNOSIS — I10 Essential (primary) hypertension: Secondary | ICD-10-CM | POA: Insufficient documentation

## 2018-08-05 DIAGNOSIS — I779 Disorder of arteries and arterioles, unspecified: Secondary | ICD-10-CM

## 2018-08-05 DIAGNOSIS — Z87891 Personal history of nicotine dependence: Secondary | ICD-10-CM | POA: Diagnosis not present

## 2018-08-05 LAB — COMPREHENSIVE METABOLIC PANEL
ALBUMIN: 3.5 g/dL (ref 3.5–5.0)
ALT: 22 U/L (ref 0–44)
AST: 25 U/L (ref 15–41)
Alkaline Phosphatase: 107 U/L (ref 38–126)
Anion gap: 9 (ref 5–15)
BILIRUBIN TOTAL: 0.5 mg/dL (ref 0.3–1.2)
BUN: 23 mg/dL (ref 8–23)
CO2: 21 mmol/L — AB (ref 22–32)
Calcium: 9.5 mg/dL (ref 8.9–10.3)
Chloride: 104 mmol/L (ref 98–111)
Creatinine, Ser: 1.51 mg/dL — ABNORMAL HIGH (ref 0.44–1.00)
GFR calc Af Amer: 39 mL/min — ABNORMAL LOW (ref >60)
GFR calc non Af Amer: 34 mL/min — ABNORMAL LOW (ref >60)
GLUCOSE: 108 mg/dL — AB (ref 70–99)
Potassium: 4.1 mmol/L (ref 3.5–5.1)
SODIUM: 134 mmol/L — AB (ref 135–145)
TOTAL PROTEIN: 6.7 g/dL (ref 6.5–8.1)

## 2018-08-05 LAB — CBC WITH DIFFERENTIAL/PLATELET
ABS IMMATURE GRANULOCYTES: 0.08 10*3/uL — AB (ref 0.00–0.07)
BASOS PCT: 1 %
Basophils Absolute: 0.1 10*3/uL (ref 0.0–0.1)
Eosinophils Absolute: 0.6 10*3/uL — ABNORMAL HIGH (ref 0.0–0.5)
Eosinophils Relative: 5 %
HEMATOCRIT: 37.2 % (ref 36.0–46.0)
HEMOGLOBIN: 11.9 g/dL — AB (ref 12.0–15.0)
IMMATURE GRANULOCYTES: 1 %
LYMPHS ABS: 3 10*3/uL (ref 0.7–4.0)
LYMPHS PCT: 23 %
MCH: 29.4 pg (ref 26.0–34.0)
MCHC: 32 g/dL (ref 30.0–36.0)
MCV: 91.9 fL (ref 80.0–100.0)
MONO ABS: 0.6 10*3/uL (ref 0.1–1.0)
MONOS PCT: 5 %
NEUTROS PCT: 65 %
Neutro Abs: 8.7 10*3/uL — ABNORMAL HIGH (ref 1.7–7.7)
PLATELETS: 305 10*3/uL (ref 150–400)
RBC: 4.05 MIL/uL (ref 3.87–5.11)
RDW: 13.3 % (ref 11.5–15.5)
WBC: 13 10*3/uL — ABNORMAL HIGH (ref 4.0–10.5)
nRBC: 0 % (ref 0.0–0.2)

## 2018-08-05 LAB — I-STAT CG4 LACTIC ACID, ED: LACTIC ACID, VENOUS: 0.94 mmol/L (ref 0.5–1.9)

## 2018-08-05 MED ORDER — DOXYCYCLINE HYCLATE 100 MG PO CAPS: 100.0000 mg | ORAL_CAPSULE | Freq: Two times a day (BID) | ORAL | 0 refills | Status: AC

## 2018-08-05 NOTE — ED Notes (Signed)
Called pt. No answer °

## 2018-08-05 NOTE — ED Provider Notes (Signed)
[facility] EMERGENCY DEPARTMENT Provider Note   CSN: [id number] Arrival date & time: 08/05/18  1839     History   Chief Complaint Chief Complaint  Patient presents with  . Colostomy Problem    HPI [name] is a 72 y.o. female.  72 y/o female with an extensive PMH including Anxiety, HTN, Hyperlipidemia, DM,Stroke presents to the ED with a need for colostomy supplies. Patient reports she's been out of her supplies for the past 3 days as they are on back order. She reports she's having some increased irritation and redness. Patient reports the wound is warmth to touch. She has tried keeping diapers on in order to keep the colostomy site from draining. Patient was seen once by her surgeon for her colostomy but has yet to be "released by him". She denies any fever, shortness of breath, chest pain or other complaints.      Past Medical History:  Diagnosis Date  . Anxiety   . Arthropathy, unspecified, site unspecified   . Blind loop syndrome   . Carotid artery disease ([place])    documented occlusion of left ICA  . Coronary artery disease    a. known 60-70% mid-LAD stenosis with caths in 1999, 2002, 2006, and 2012 showing stable anatomy b. low-risk NST in 10/2015  . Depression   . Diabetes insipidus ([place])   . Diabetes mellitus   . Diverticulosis   . Dizziness    chronic  . Dyslipidemia   . Dyspnea    with exertion  . Fatty liver   . GERD (gastroesophageal reflux disease)    hiatial hernia  . Hepatomegaly   . Hiatal hernia   . HTN (hypertension)   . Hyperlipidemia   . Hypertension   . Irritable bowel syndrome   . Melanosis coli   . Metatarsal fracture right  . Normal nuclear stress test    nml 01/03/10;06/19/07  . Other, mixed, or unspecified nondependent drug abuse, unspecified   . Pancreatitis, chronic ([place])   . Peripheral vascular disease ([place]) 02/20/10   s/p stent of left SFA  . Sleep apnea   . Stroke ([place])   . Thyroid nodule   .  Unspecified essential hypertension   . Vitamin B12 deficiency     Patient Active Problem List   Diagnosis Date Noted  . Ileostomy in place [facility]) 10/23/2017  . Pressure injury of skin 10/19/2017  . 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  Ileostomy 10/18/2017 10/14/2017  . Ischemic colitis ([place]) 10/14/2017  . Acute respiratory failure with hypoxemia ([place]) 10/14/2017  . Acute post-operative pain 10/14/2017  . AKI (acute kidney injury) ([place]) 10/14/2017  . Anemia 10/14/2017  . Nocturia more than twice per night 05/07/2017  . Diabetes due to underlying condition w oth circulatory comp ([place]) 05/07/2017  . Atherosclerotic PVD with intermittent claudication ([place]) 05/07/2017  . COPD (chronic obstructive pulmonary disease) ([place]) 05/07/2017  . Poor sleep hygiene 05/07/2017  . 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 Hilar lymphadenopathy   . Hemoptysis 02/19/2017  . Pulmonary nodules/lesions, multiple 02/19/2017  . Tobacco use 02/19/2017  . RUQ abdominal pain 01/13/2017  . Pancreatic insufficiency 01/13/2017  . Left-sided extracranial carotid artery occlusion 11/26/2016  . PAOD (peripheral arterial occlusive disease) ([place]) 11/26/2016  . Encounter for routine follow-up after surgery of the circulatory system 11/26/2016  . Chest pain with moderate risk of acute coronary syndrome 10/27/2015  . Type 2 diabetes mellitus ([place]) 10/27/2015  . Occlusion and stenosis of carotid artery  without mention of cerebral infarction 04/14/2012  . Chronic pancreatitis ([place]) 04/30/2011  . Vitamin B12 deficiency 04/30/2011  . 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 Fatty liver 04/30/2011  . Obesity 04/30/2011  . Abdominal pain 04/09/2011  . Iron deficiency anemia, unspecified  04/09/2011  . Other general symptoms  04/09/2011  . DM (diabetes mellitus screen) 04/09/2011  . Asthma 04/09/2011  . Coronary artery disease   . Dyslipidemia   . Dizziness   . Right-sided extracranial carotid artery  stenosis   . Depression   . Peripheral vascular disease ([place])   . Normal nuclear stress test   . B12 DEFICIENCY 03/19/2010  . DVT 03/16/2010  . BLIND LOOP SYNDROME 03/16/2010  . DIARRHEA 03/16/2010  . LAXATIVE ABUSE 12/17/2007  . Essential hypertension 12/17/2007  . GERD 12/17/2007  . HIATAL HERNIA 12/17/2007  . IBS 12/17/2007  . MELANOSIS COLI 12/17/2007  . ARTHRITIS 12/17/2007  . HEPATOMEGALY 12/17/2007    Past Surgical History:  Procedure Laterality Date  . ABDOMINAL HYSTERECTOMY     partial  . AUGMENTATION MAMMAPLASTY     bilateral 1976 retro   . BREAST ENHANCEMENT SURGERY    . CARDIAC CATHETERIZATION  07/07/98;08/31/01;03/04/05   60 -70% LAD  . CATARACT EXTRACTION    . COLON RESECTION N/A 10/14/2017   Procedure: EXPLORATORY LAPAROTOMY, EXTENDED RIGHT COLECTOMY; APPLICATION OF ABDOMINAL VACUUM DRESSING;  Surgeon: Jovita Kussmaul, MD;  Location: [facility] ORS;  Service: General;  Laterality: N/A;  . ENDOBRONCHIAL ULTRASOUND Bilateral 02/24/2017   Procedure: ENDOBRONCHIAL ULTRASOUND;  Surgeon: Collene Gobble, MD;  Location: [facility];  Service: Cardiopulmonary;  Laterality: Bilateral;  . EYE SURGERY Left Nov. 2016   Cataract  . EYE SURGERY Right 2001   Cataract  . FEMORAL ARTERY STENT     Left leg  . LAPAROTOMY N/A 10/16/2017   Procedure: EXPLORATORY LAPAROTOMY, PARTIAL OMENTECTOMY, RESECTION ISCHEMIC ILEUM 277OE, APPLICATION OF VAC ABDOMINAL DRESSING;  Surgeon: Armandina Gemma, MD;  Location: [facility];  Service: General;  Laterality: N/A;  . LAPAROTOMY N/A 10/18/2017   Procedure: RE-EXPLORATION OF ABDOMEN, ILEOSTOMY CREATION;  Surgeon: Stark Klein, MD;  Location: [facility];  Service: General;  Laterality: N/A;  . LUNG SURGERY  03/2017   Benign polyps removed  . PATELLA REALIGNMENT Left   . TONSILLECTOMY    . TOTAL ABDOMINAL HYSTERECTOMY    . WRIST SURGERY     right     OB History   None      Home Medications    Prior to Admission medications   Medication Sig Start Date End  Date Taking? Authorizing Provider  amLODipine (NORVASC) 10 MG tablet Place 1 tablet (10 mg total) into feeding tube daily. 11/06/17   Florencia Reasons, MD  ARIPiprazole (ABILIFY) 5 MG tablet Place 1 tablet (5 mg total) into feeding tube daily. 11/06/17   Florencia Reasons, MD  Dietary Management Product (VSL#3) PACK VSL# 3 900  Take two packets daily. 07/10/18   Willia Craze, NP  doxycycline (VIBRAMYCIN) 100 MG capsule Take 1 capsule (100 mg total) by mouth 2 (two) times daily for 7 days. 08/05/18 08/12/18  Janeece Fitting, PA-C  losartan (COZAAR) 25 MG tablet Take 1 tablet (25 mg total) by mouth daily. 04/09/18   Orpah Greek, MD  metoprolol tartrate (LOPRESSOR) 50 MG tablet Place 1 tablet (50 mg total) into feeding tube 2 (two) times daily. 11/06/17   Florencia Reasons, MD  nitroGLYCERIN (NITROSTAT) 0.4 MG SL tablet Place 1 tablet (0.4 mg total) under the tongue  every 5 (five) minutes as needed for chest pain. 11/08/15   Martinique, Peter M, MD  venlafaxine [facility]) 75 MG tablet Place 1 tablet (75 mg total) into feeding tube 2 (two) times daily with a meal. 11/06/17   Florencia Reasons, MD  Water For Irrigation, Sterile (FREE WATER) SOLN Place 200 mLs into feeding tube every 6 (six) hours. 11/06/17   Florencia Reasons, MD  zolpidem (AMBIEN) 10 MG tablet Take 10 mg by mouth at bedtime as needed for sleep.    [provider]    Family History Family History  Problem Relation Age of Onset  . Hypertension Mother   . Osteoarthritis Mother   . Pulmonary embolism Mother   . Hypertension Father        aneurysm  . Stroke Father   . Stroke Unknown   . Colon cancer Paternal Grandfather     Social History Social History   Tobacco Use  . Smoking status: Former Smoker    Packs/day: 1.00    Years: 50.00    Pack years: 50.00    Types: Cigarettes    Last attempt to quit: 10/14/2010    Years since quitting: 7.8  . Smokeless tobacco: Never Used  Substance Use Topics  . Alcohol use: No  . Drug use: No     Allergies     Adhesive [tape]; Codeine; and Iodinated diagnostic agents   Review of Systems Review of Systems  Constitutional: Negative for fever.  HENT: Negative for sore throat.   Respiratory: Negative for shortness of breath.   Cardiovascular: Negative for chest pain.  Skin: Positive for wound.  Neurological: Negative for light-headedness and headaches.  All other systems reviewed and are negative.    Physical Exam Updated Vital Signs BP (!) 220/100   Pulse (!) 53   Temp 97.9 F (36.6 C) (Oral)   Resp 20   SpO2 99%   Physical Exam  Constitutional: She is oriented to person, place, and time. She appears well-developed and well-nourished.  HENT:  Head: Normocephalic and atraumatic.  Neck: Normal range of motion. Neck supple.  Cardiovascular: Normal heart sounds.  Pulmonary/Chest: Effort normal.  Abdominal: Soft.  Neurological: She is alert and oriented to person, place, and time.  Skin: There is erythema.     Nursing note and vitals reviewed.      ED Treatments / Results  Labs (all labs ordered are listed, but only abnormal results are displayed) Labs Reviewed  COMPREHENSIVE METABOLIC PANEL - Abnormal; Notable for the following components:      Result Value   Sodium 134 (*)    CO2 21 (*)    Glucose, Bld 108 (*)    Creatinine, Ser 1.51 (*)    GFR calc non Af Amer 34 (*)    GFR calc Af Amer 39 (*)    All other components within normal limits  CBC WITH DIFFERENTIAL/PLATELET - Abnormal; Notable for the following components:   WBC 13.0 (*)    Hemoglobin 11.9 (*)    Neutro Abs 8.7 (*)    Eosinophils Absolute 0.6 (*)    Abs Immature Granulocytes 0.08 (*)    All other components within normal limits  I-STAT CG4 LACTIC ACID, ED    EKG None  Radiology No results found.  Procedures Procedures (including critical care time)  Medications Ordered in ED Medications - No data to display   Initial Impression / Assessment and Plan / ED Course  I have reviewed the  triage vital signs and the  nursing notes.  Pertinent labs & imaging results that were available during my care of the patient were reviewed by me and considered in my medical decision making (see chart for details).    He presents requesting supplies for her ostomy bag as she had not had them for the past 3 days.  Upon examination the wound site looks erythematous, warmth, there is oozing and blood.  I discussed this patient with Dr. Regenia Skeeter who has also seen and evaluated this patient.  Labs ordered to rule out any signs of infection. CBC showed leukocytosis, hemoglobin is consistent with previous visits.  CMP showed slight decrease in sodium and creatinine consistent with her previous visits.  Lactic acid was within normal limits.  At this time I have advised patient that I will provide her ostomy bags in order for her to change them until her shipment comes.  I will also provide her with an antibiotic in order to treat her for a preventative cellulitis.  Patient is advised to return to the ED if she experiences any fever, worsening symptoms, worsening redness to the area.  Patient's vitals will be rechecked prior to discharge as her arrival vitals where blood pressure of 220/100 and heart rate of 53 and consistent with patient's previous visits.  She does have a history of uncontrolled blood pressure but this was high compared to her last visit, she denies any headaches, chest pain, shortness of breath or other complaints.  11:08 PM I have personally rechecked her BP and HR prior to discharge 169/90 HR 87.Patient stable for discharge.   Final Clinical Impressions(s) / ED Diagnoses   Final diagnoses:  Ileostomy care [facility])    ED Discharge Orders         Ordered    doxycycline (VIBRAMYCIN) 100 MG capsule  2 times daily     08/05/18 2252           Janeece Fitting, PA-C 08/05/18 2309    Sherwood Gambler, MD 08/05/18 2359

## 2018-08-05 NOTE — ED Triage Notes (Signed)
Pt states that she has been out of her colostomy supplies for three days and now has redness and irritation around her stoma. No other complaints, just requesting barrier cream and supplies.

## 2018-08-05 NOTE — Discharge Instructions (Signed)
I have prescribed antibiotics please take 1 tablet twice a day for the next 7 days to treat the infection to the wound.  I have provided ileostomy bags for your care, please call to schedule an ointment with the wound center, please refer to the number attached to your discharge papers.  You may also want to schedule an appointment with your surgeon in order for him to see you sooner than the 2-week checkup.  If you experience any fever, drainage from the wound, increase in redness or warmth please return to the ED.

## 2018-08-13 ENCOUNTER — Ambulatory Visit (HOSPITAL_COMMUNITY)
Admission: RE | Admit: 2018-08-13 | Discharge: 2018-08-13 | Disposition: A | Discharge status: Home / Self Care | Payer: Medicare Other | Source: Ambulatory Visit | Attending: Family | Admitting: Family

## 2018-08-13 ENCOUNTER — Ambulatory Visit (INDEPENDENT_AMBULATORY_CARE_PROVIDER_SITE_OTHER)
Admission: RE | Admit: 2018-08-13 | Discharge: 2018-08-13 | Disposition: A | Discharge status: Home / Self Care | Payer: Medicare Other | Source: Ambulatory Visit | Attending: Family | Admitting: Family

## 2018-08-13 DIAGNOSIS — Z959 Presence of cardiac and vascular implant and graft, unspecified: Secondary | ICD-10-CM | POA: Insufficient documentation

## 2018-08-13 DIAGNOSIS — I6521 Occlusion and stenosis of right carotid artery: Secondary | ICD-10-CM | POA: Diagnosis not present

## 2018-08-13 DIAGNOSIS — I6522 Occlusion and stenosis of left carotid artery: Secondary | ICD-10-CM | POA: Insufficient documentation

## 2018-08-13 DIAGNOSIS — I779 Disorder of arteries and arterioles, unspecified: Secondary | ICD-10-CM | POA: Diagnosis not present

## 2018-08-14 DIAGNOSIS — K559 Vascular disorder of intestine, unspecified: Secondary | ICD-10-CM | POA: Diagnosis not present

## 2018-08-17 ENCOUNTER — Ambulatory Visit (HOSPITAL_COMMUNITY)
Admission: RE | Admit: 2018-08-17 | Discharge: 2018-08-17 | Disposition: A | Discharge status: Home / Self Care | Payer: Medicare Other | Source: Ambulatory Visit | Attending: Family | Admitting: Family

## 2018-08-17 ENCOUNTER — Ambulatory Visit (INDEPENDENT_AMBULATORY_CARE_PROVIDER_SITE_OTHER): Payer: Medicare Other | Admitting: Family

## 2018-08-17 ENCOUNTER — Encounter: Payer: Self-pay | Admitting: Family

## 2018-08-17 ENCOUNTER — Other Ambulatory Visit: Payer: Self-pay

## 2018-08-17 VITALS — BP 177/76 | HR 66 | Temp 97.6°F | Resp 18 | Ht 63.0 in | Wt 141.0 lb

## 2018-08-17 DIAGNOSIS — Z959 Presence of cardiac and vascular implant and graft, unspecified: Secondary | ICD-10-CM | POA: Diagnosis not present

## 2018-08-17 DIAGNOSIS — E1151 Type 2 diabetes mellitus with diabetic peripheral angiopathy without gangrene: Secondary | ICD-10-CM | POA: Diagnosis not present

## 2018-08-17 DIAGNOSIS — I779 Disorder of arteries and arterioles, unspecified: Secondary | ICD-10-CM | POA: Diagnosis not present

## 2018-08-17 DIAGNOSIS — I6522 Occlusion and stenosis of left carotid artery: Secondary | ICD-10-CM | POA: Diagnosis not present

## 2018-08-17 DIAGNOSIS — I6521 Occlusion and stenosis of right carotid artery: Secondary | ICD-10-CM | POA: Diagnosis not present

## 2018-08-17 DIAGNOSIS — Z87891 Personal history of nicotine dependence: Secondary | ICD-10-CM

## 2018-08-17 NOTE — Progress Notes (Signed)
[facility] [place] HISTORY AND PHYSICAL   CC: Follow up extracranial carotid artery stenosis and peripheral artery occlusive disease    History of Present Illness:   [name] is a 72 y.o. female who is status post a left SFA stent May of 2011 by Dr. Kellie Simmering, she also has a known left ICA occlusion.  She denies claudication symptoms with walking, denies non healing wounds. She denies any known history of stroke or TIA.   She had small bowel ischemia, admitted for abdominal pain in January 2019, had small bowel resection, has an ileostomy. She states she had a stroke during that hospitalization, but denies any residual neurological deficit. She states her tongue developed a crease and has some different speech from this.   She lost about 40 pounds, she no longer needs DM medications.   Diabetic: Yes, last A1C result on file was 6.6 on 10-25-17 Tobacco use: former smoker, quit in January 2019 (was 1 ppd, started at age 50 yrs)  Pt meds include: Statin :no Betablocker: Yes ASA: No, pt states she was advised by Dr. Martinique to take a daily 81 mg ASA Other anticoagulants/antiplatelets: no   Current Outpatient Medications  Medication Sig Dispense Refill  . amLODipine (NORVASC) 10 MG tablet Place 1 tablet (10 mg total) into feeding tube daily. 30 tablet 0  . ARIPiprazole (ABILIFY) 5 MG tablet Place 1 tablet (5 mg total) into feeding tube daily. 30 tablet 0  . Dietary Management Product (VSL#3) PACK VSL# 3 900  Take two packets daily. 60 each 3  . losartan (COZAAR) 25 MG tablet Take 1 tablet (25 mg total) by mouth daily. 30 tablet 0  . metoprolol tartrate (LOPRESSOR) 50 MG tablet Place 1 tablet (50 mg total) into feeding tube 2 (two) times daily. 60 tablet 0  . nitroGLYCERIN (NITROSTAT) 0.4 MG SL tablet Place 1 tablet (0.4 mg total) under the tongue every 5 (five) minutes as needed for chest pain. 90 tablet 3  . venlafaxine (EFFEXOR) 75 MG tablet Place 1 tablet  (75 mg total) into feeding tube 2 (two) times daily with a meal. 60 tablet 0  . Water For Irrigation, Sterile (FREE WATER) SOLN Place 200 mLs into feeding tube every 6 (six) hours. 1000 mL 0  . zolpidem (AMBIEN) 10 MG tablet Take 10 mg by mouth at bedtime as needed for sleep.     No current facility-administered medications for this visit.     Past Medical History:  Diagnosis Date  . Anxiety   . Arthropathy, unspecified, site unspecified   . Blind loop syndrome   . Carotid artery disease ([place])    documented occlusion of left ICA  . Coronary artery disease    a. known 60-70% mid-LAD stenosis with caths in 1999, 2002, 2006, and 2012 showing stable anatomy b. low-risk NST in 10/2015  . Depression   . Diabetes insipidus ([place])   . Diabetes mellitus   . Diverticulosis   . Dizziness    chronic  . Dyslipidemia   . Dyspnea    with exertion  . Fatty liver   . GERD (gastroesophageal reflux disease)    hiatial hernia  . Hepatomegaly   . Hiatal hernia   . HTN (hypertension)   . Hyperlipidemia   . Hypertension   . Irritable bowel syndrome   . Melanosis coli   . Metatarsal fracture right  . Normal nuclear stress test    nml 01/03/10;06/19/07  . Other, mixed, or unspecified nondependent  drug abuse, unspecified   . Pancreatitis, chronic ([place])   . Peripheral vascular disease ([place]) 02/20/10   s/p stent of left SFA  . Sleep apnea   . Stroke ([place])   . Thyroid nodule   . Unspecified essential hypertension   . Vitamin B12 deficiency     Social History Social History   Tobacco Use  . Smoking status: Former Smoker    Packs/day: 1.00    Years: 50.00    Pack years: 50.00    Types: Cigarettes    Last attempt to quit: 10/14/2010    Years since quitting: 7.8  . Smokeless tobacco: Never Used  Substance Use Topics  . Alcohol use: No  . Drug use: No    Family History Family History  Problem Relation Age of Onset  . Hypertension Mother   . Osteoarthritis Mother   . Pulmonary embolism  Mother   . Hypertension Father        aneurysm  . Stroke Father   . Stroke Unknown   . Colon cancer Paternal Grandfather     Surgical History Past Surgical History:  Procedure Laterality Date  . ABDOMINAL HYSTERECTOMY     partial  . AUGMENTATION MAMMAPLASTY     bilateral 1976 retro   . BREAST ENHANCEMENT SURGERY    . CARDIAC CATHETERIZATION  07/07/98;08/31/01;03/04/05   60 -70% LAD  . CATARACT EXTRACTION    . COLON RESECTION N/A 10/14/2017   Procedure: EXPLORATORY LAPAROTOMY, EXTENDED RIGHT COLECTOMY; APPLICATION OF ABDOMINAL VACUUM DRESSING;  Surgeon: Jovita Kussmaul, MD;  Location: [facility];  Service: General;  Laterality: N/A;  . ENDOBRONCHIAL ULTRASOUND Bilateral 02/24/2017   Procedure: ENDOBRONCHIAL ULTRASOUND;  Surgeon: Collene Gobble, MD;  Location: [facility];  Service: Cardiopulmonary;  Laterality: Bilateral;  . EYE SURGERY Left Nov. 2016   Cataract  . EYE SURGERY Right 2001   Cataract  . FEMORAL ARTERY STENT     Left leg  . LAPAROTOMY N/A 10/16/2017   Procedure: EXPLORATORY LAPAROTOMY, PARTIAL OMENTECTOMY, RESECTION ISCHEMIC ILEUM 944HQ, APPLICATION OF VAC ABDOMINAL DRESSING;  Surgeon: Armandina Gemma, MD;  Location: [facility];  Service: General;  Laterality: N/A;  . LAPAROTOMY N/A 10/18/2017   Procedure: RE-EXPLORATION OF ABDOMEN, ILEOSTOMY CREATION;  Surgeon: Stark Klein, MD;  Location: [facility];  Service: General;  Laterality: N/A;  . LUNG SURGERY  03/2017   Benign polyps removed  . PATELLA REALIGNMENT Left   . TONSILLECTOMY    . TOTAL ABDOMINAL HYSTERECTOMY    . WRIST SURGERY     right    Allergies  Allergen Reactions  . Adhesive [Tape] Other (See Comments)    "Took off my skin"  . Codeine Other (See Comments)    Altered mental state   . Iodinated Diagnostic Agents Rash    Current Outpatient Medications  Medication Sig Dispense Refill  . amLODipine (NORVASC) 10 MG tablet Place 1 tablet (10 mg total) into feeding tube daily. 30 tablet 0  . ARIPiprazole (ABILIFY) 5  MG tablet Place 1 tablet (5 mg total) into feeding tube daily. 30 tablet 0  . Dietary Management Product (VSL#3) PACK VSL# 3 900  Take two packets daily. 60 each 3  . losartan (COZAAR) 25 MG tablet Take 1 tablet (25 mg total) by mouth daily. 30 tablet 0  . metoprolol tartrate (LOPRESSOR) 50 MG tablet Place 1 tablet (50 mg total) into feeding tube 2 (two) times daily. 60 tablet 0  . nitroGLYCERIN (NITROSTAT) 0.4 MG SL tablet Place 1 tablet (0.4 mg  total) under the tongue every 5 (five) minutes as needed for chest pain. 90 tablet 3  . venlafaxine (EFFEXOR) 75 MG tablet Place 1 tablet (75 mg total) into feeding tube 2 (two) times daily with a meal. 60 tablet 0  . Water For Irrigation, Sterile (FREE WATER) SOLN Place 200 mLs into feeding tube every 6 (six) hours. 1000 mL 0  . zolpidem (AMBIEN) 10 MG tablet Take 10 mg by mouth at bedtime as needed for sleep.     No current facility-administered medications for this visit.      REVIEW OF SYSTEMS: See HPI for pertinent positives and negatives.  Physical Examination Vitals:   08/17/18 0956 08/17/18 0957  BP: (!) 188/72 (!) 177/76  Pulse: 66   Resp: 18   Temp: 97.6 F (36.4 C)   TempSrc: Oral   SpO2: 99%   Weight: 141 lb (64 kg)   Height: 5\' 3"  (1.6 m)    Body mass index is 24.98 kg/m.  General:  WDWN in female NAD Gait: slow, steady HENT:crease in tongue Eyes: Pupils equal Pulmonary: normal non-labored breathing, fair air movement in all fields, CTAB, no rales, rhonchi, or wheezes. Cardiac: RRR, no murmur detected Abdomen: soft, NT, no masses palpated. Ileostomy with bag appliance at RLQ, significant watery green leaking around edges of ostomy.  Skin: moderate red irritation of skin surrounding ileostomy   VASCULAR EXAM  Carotid Bruits Right Left   Negative Positive      Radial pulses are 2+ palpable bilaterally   Adominal aortic pulse is not palpable                      VASCULAR EXAM: Extremities without ischemic changes,  without Gangrene; without open wounds.                                                                                                          LE Pulses Right Left       FEMORAL  2+ palpable  1+ palpable        POPLITEAL  not palpable   not palpable       POSTERIOR TIBIAL  1+ palpable   not palpable        DORSALIS PEDIS      ANTERIOR TIBIAL 2+ palpable  1+ palpable     Musculoskeletal: no muscle wasting or atrophy; no peripheral edema  Neurologic:  A&O X 3; appropriate affect, sensation is normal; speech is normal, CN 2-12 intact, pain and light touch intact in extremities, motor exam as listed above. Psychiatric: Normal thought content, mood appropriate to clinical situation.     ASSESSMENT:  [name] is a 72 y.o. female who is status post a left SFA stent placement May of 2011, she also has a known left ICA occlusion.  She had a stroke during hospitalization in January 2019 for bowel ischemia and small bowel resection, but denies any residual neurological deficit. She states her tongue developed a crease and has some different speech from this.   She has no  claudication symptoms with walking, no signs of ischemia in her feet/legs.  Her atherosclerotic risk factors include former smoker from age 89 to age 39, controlled DM, and CAD. See Plan.    She is not walking other than in her daily activities. She did fall once, while in rehab after her stroke, not since then. Has stumbled a few times since rehab.    DATA  Carotid Duplex (08-13-18): Right ICA: 40-59% stenosis Left ICA: confirmed occlusion. >50% stenosis of the mid left CCA. No change compared to the exams on 11-22-15 and 11-26-16.    Left LE Arterial Duplex (08-17-18): Highest velocity is 168 cm/s in the mid stent, all biphasic waveforms.   ABI (Date: 08-13-18):  R:   ABI: 0.9 (was 1.0 on 11-26-16),   PT: tri  DP: tri  TBI:  0.81 (was 0.63)  L:   ABI: 0.79 (was 0.93),   PT: tri  DP:  tri  TBI: 0.70 (was 0.74) Slight decline in bilateral ABI, all triphasic waveforms. Mild disease in the right, moderate in the left.    CT head in 2000: no evidence of stroke   PLAN:   Graduated walking program with her walker discussed. Daily seated leg exercises demonstrated and discussed.   Based on today's exam and non-invasive vascular lab results, the patient will follow up in 6 months with the following tests: carotid duplex, ABI's, and left LE arterial duplex.  I discussed in depth with the patient the nature of atherosclerosis, and emphasized the importance of maximal medical management including strict control of blood pressure, blood glucose, and lipid levels, obtaining regular exercise, and continued cessation of smoking.  The patient is aware that without maximal medical management the underlying atherosclerotic disease process will progress, limiting the benefit of any interventions.  The patient was given information about stroke prevention and what symptoms should prompt the patient to seek immediate medical care.  The patient was given information about PAD including signs, symptoms, treatment, what symptoms should prompt the patient to seek immediate medical care, and risk reduction measures to take.  Thank you for allowing [place] to participate in this patient's care.  Clemon Chambers, RN, MSN, FNP-C [facility] Office: [phone]  Clinic MD: Trula Slade 08/17/2018 10:08 AM

## 2018-08-17 NOTE — Patient Instructions (Addendum)
Stroke Prevention Some health problems and behaviors may make it more likely for you to have a stroke. Below are ways to lessen your risk of having a stroke.  Be active for at least 30 minutes on most or all days.  Do not smoke. Try not to be around others who smoke.  Do not drink too much alcohol. ? Do not have more than 2 drinks a day if you are a man. ? Do not have more than 1 drink a day if you are a woman and are not pregnant.  Eat healthy foods, such as fruits and vegetables. If you were put on a specific diet, follow the diet as told.  Keep your cholesterol levels under control through diet and medicines. Look for foods that are low in saturated fat, trans fat, cholesterol, and are high in fiber.  If you have diabetes, follow all diet plans and take your medicine as told.  Ask your doctor if you need treatment to lower your blood pressure. If you have high blood pressure (hypertension), follow all diet plans and take your medicine as told by your doctor.  If you are 18-39 years old, have your blood pressure checked every 3-5 years. If you are age 40 or older, have your blood pressure checked every year.  Keep a healthy weight. Eat foods that are low in calories, salt, saturated fat, trans fat, and cholesterol.  Do not take drugs.  Avoid birth control pills, if this applies. Talk to your doctor about the risks of taking birth control pills.  Talk to your doctor if you have sleep problems (sleep apnea).  Take all medicine as told by your doctor. ? You may be told to take aspirin or blood thinner medicine. Take this medicine as told by your doctor. ? Understand your medicine instructions.  Make sure any other conditions you have are being taken care of.  Get help right away if:  You suddenly lose feeling (you feel numb) or have weakness in your face, arm, or leg.  Your face or eyelid hangs down to one side.  You suddenly feel confused.  You have trouble talking  (aphasia) or understanding what people are saying.  You suddenly have trouble seeing in one or both eyes.  You suddenly have trouble walking.  You are dizzy.  You lose your balance or your movements are clumsy (uncoordinated).  You suddenly have a very bad headache and you do not know the cause.  You have new chest pain.  Your heart feels like it is fluttering or skipping a beat (irregular heartbeat). Do not wait to see if the symptoms above go away. Get help right away. Call your local emergency services (911 in [place] Do not drive yourself to the hospital. This information is not intended to replace advice given to you by your health care provider. Make sure you discuss any questions you have with your health care provider. Document Released: 03/31/2012 Document Revised: 03/07/2016 Document Reviewed: 04/02/2013 Elsevier Interactive Patient Education  2018 Elsevier Inc.     Peripheral Vascular Disease Peripheral vascular disease (PVD) is a disease of the blood vessels that are not part of your heart and brain. A simple term for PVD is poor circulation. In most cases, PVD narrows the blood vessels that carry blood from your heart to the rest of your body. This can result in a decreased supply of blood to your arms, legs, and internal organs, like your stomach or kidneys. However, it most often affects   a person's lower legs and feet. There are two types of PVD.  Organic PVD. This is the more common type. It is caused by damage to the structure of blood vessels.  Functional PVD. This is caused by conditions that make blood vessels contract and tighten (spasm).  Without treatment, PVD tends to get worse over time. PVD can also lead to acute ischemic limb. This is when an arm or limb suddenly has trouble getting enough blood. This is a medical emergency. Follow these instructions at home:  Take medicines only as told by your doctor.  Do not use any tobacco products, including  cigarettes, chewing tobacco, or electronic cigarettes. If you need help quitting, ask your doctor.  Lose weight if you are overweight, and maintain a healthy weight as told by your doctor.  Eat a diet that is low in fat and cholesterol. If you need help, ask your doctor.  Exercise regularly. Ask your doctor for some good activities for you.  Take good care of your feet. ? Wear comfortable shoes that fit well. ? Check your feet often for any cuts or sores. Contact a doctor if:  You have cramps in your legs while walking.  You have leg pain when you are at rest.  You have coldness in a leg or foot.  Your skin changes.  You are unable to get or have an erection (erectile dysfunction).  You have cuts or sores on your feet that are not healing. Get help right away if:  Your arm or leg turns cold and blue.  Your arms or legs become red, warm, swollen, painful, or numb.  You have chest pain or trouble breathing.  You suddenly have weakness in your face, arm, or leg.  You become very confused or you cannot speak.  You suddenly have a very bad headache.  You suddenly cannot see. This information is not intended to replace advice given to you by your health care provider. Make sure you discuss any questions you have with your health care provider. Document Released: 12/25/2009 Document Revised: 03/07/2016 Document Reviewed: 03/10/2014 Elsevier Interactive Patient Education  2017 Elsevier Inc.  

## 2018-08-20 ENCOUNTER — Encounter (HOSPITAL_BASED_OUTPATIENT_CLINIC_OR_DEPARTMENT_OTHER): Payer: Medicare Other | Attending: Internal Medicine

## 2018-08-20 ENCOUNTER — Encounter (HOSPITAL_BASED_OUTPATIENT_CLINIC_OR_DEPARTMENT_OTHER): Payer: Self-pay

## 2018-08-20 DIAGNOSIS — Y833 Surgical operation with formation of external stoma as the cause of abnormal reaction of the patient, or of later complication, without mention of misadventure at the time of the procedure: Secondary | ICD-10-CM | POA: Diagnosis not present

## 2018-08-20 DIAGNOSIS — L259 Unspecified contact dermatitis, unspecified cause: Secondary | ICD-10-CM | POA: Diagnosis not present

## 2018-08-20 DIAGNOSIS — I10 Essential (primary) hypertension: Secondary | ICD-10-CM | POA: Diagnosis not present

## 2018-08-20 DIAGNOSIS — E119 Type 2 diabetes mellitus without complications: Secondary | ICD-10-CM | POA: Insufficient documentation

## 2018-08-20 DIAGNOSIS — T8189XA Other complications of procedures, not elsewhere classified, initial encounter: Secondary | ICD-10-CM | POA: Diagnosis not present

## 2018-08-20 DIAGNOSIS — L2489 Irritant contact dermatitis due to other agents: Secondary | ICD-10-CM | POA: Diagnosis not present

## 2018-08-20 DIAGNOSIS — J449 Chronic obstructive pulmonary disease, unspecified: Secondary | ICD-10-CM | POA: Diagnosis not present

## 2018-08-20 DIAGNOSIS — Z87891 Personal history of nicotine dependence: Secondary | ICD-10-CM | POA: Insufficient documentation

## 2018-08-20 DIAGNOSIS — K9413 Enterostomy malfunction: Secondary | ICD-10-CM | POA: Diagnosis not present

## 2018-08-25 DIAGNOSIS — Z85828 Personal history of other malignant neoplasm of skin: Secondary | ICD-10-CM | POA: Diagnosis not present

## 2018-08-25 DIAGNOSIS — D2261 Melanocytic nevi of right upper limb, including shoulder: Secondary | ICD-10-CM | POA: Diagnosis not present

## 2018-08-25 DIAGNOSIS — D225 Melanocytic nevi of trunk: Secondary | ICD-10-CM | POA: Diagnosis not present

## 2018-08-25 DIAGNOSIS — L821 Other seborrheic keratosis: Secondary | ICD-10-CM | POA: Diagnosis not present

## 2018-08-25 DIAGNOSIS — D2262 Melanocytic nevi of left upper limb, including shoulder: Secondary | ICD-10-CM | POA: Diagnosis not present

## 2018-08-25 DIAGNOSIS — L308 Other specified dermatitis: Secondary | ICD-10-CM | POA: Diagnosis not present

## 2018-08-27 DIAGNOSIS — Z7189 Other specified counseling: Secondary | ICD-10-CM | POA: Diagnosis not present

## 2018-08-28 DIAGNOSIS — Z7189 Other specified counseling: Secondary | ICD-10-CM | POA: Diagnosis not present

## 2018-09-07 DIAGNOSIS — K559 Vascular disorder of intestine, unspecified: Secondary | ICD-10-CM | POA: Diagnosis not present

## 2018-09-18 DIAGNOSIS — J439 Emphysema, unspecified: Secondary | ICD-10-CM | POA: Diagnosis not present

## 2018-09-18 DIAGNOSIS — R600 Localized edema: Secondary | ICD-10-CM | POA: Diagnosis not present

## 2018-09-18 DIAGNOSIS — E1151 Type 2 diabetes mellitus with diabetic peripheral angiopathy without gangrene: Secondary | ICD-10-CM | POA: Diagnosis not present

## 2018-09-18 DIAGNOSIS — I2510 Atherosclerotic heart disease of native coronary artery without angina pectoris: Secondary | ICD-10-CM | POA: Diagnosis not present

## 2018-09-18 DIAGNOSIS — Z6827 Body mass index (BMI) 27.0-27.9, adult: Secondary | ICD-10-CM | POA: Diagnosis not present

## 2018-09-18 DIAGNOSIS — D508 Other iron deficiency anemias: Secondary | ICD-10-CM | POA: Diagnosis not present

## 2018-09-18 DIAGNOSIS — K8689 Other specified diseases of pancreas: Secondary | ICD-10-CM | POA: Diagnosis not present

## 2018-09-18 DIAGNOSIS — E538 Deficiency of other specified B group vitamins: Secondary | ICD-10-CM | POA: Diagnosis not present

## 2018-09-18 DIAGNOSIS — N184 Chronic kidney disease, stage 4 (severe): Secondary | ICD-10-CM | POA: Diagnosis not present

## 2018-10-11 ENCOUNTER — Other Ambulatory Visit: Payer: Self-pay

## 2018-10-11 ENCOUNTER — Emergency Department (HOSPITAL_COMMUNITY)
Admission: EM | Admit: 2018-10-11 | Discharge: 2018-10-11 | Disposition: A | Discharge status: Home / Self Care | Payer: Medicare Other | Attending: Emergency Medicine | Admitting: Emergency Medicine

## 2018-10-11 ENCOUNTER — Encounter (HOSPITAL_COMMUNITY): Payer: Self-pay

## 2018-10-11 DIAGNOSIS — R52 Pain, unspecified: Secondary | ICD-10-CM | POA: Diagnosis not present

## 2018-10-11 DIAGNOSIS — J449 Chronic obstructive pulmonary disease, unspecified: Secondary | ICD-10-CM | POA: Diagnosis not present

## 2018-10-11 DIAGNOSIS — I2510 Atherosclerotic heart disease of native coronary artery without angina pectoris: Secondary | ICD-10-CM | POA: Insufficient documentation

## 2018-10-11 DIAGNOSIS — Z87891 Personal history of nicotine dependence: Secondary | ICD-10-CM | POA: Diagnosis not present

## 2018-10-11 DIAGNOSIS — Z7902 Long term (current) use of antithrombotics/antiplatelets: Secondary | ICD-10-CM | POA: Insufficient documentation

## 2018-10-11 DIAGNOSIS — Z8673 Personal history of transient ischemic attack (TIA), and cerebral infarction without residual deficits: Secondary | ICD-10-CM | POA: Diagnosis not present

## 2018-10-11 DIAGNOSIS — E785 Hyperlipidemia, unspecified: Secondary | ICD-10-CM | POA: Insufficient documentation

## 2018-10-11 DIAGNOSIS — I10 Essential (primary) hypertension: Secondary | ICD-10-CM | POA: Insufficient documentation

## 2018-10-11 DIAGNOSIS — Z79899 Other long term (current) drug therapy: Secondary | ICD-10-CM | POA: Insufficient documentation

## 2018-10-11 DIAGNOSIS — E119 Type 2 diabetes mellitus without complications: Secondary | ICD-10-CM | POA: Diagnosis not present

## 2018-10-11 DIAGNOSIS — R531 Weakness: Secondary | ICD-10-CM | POA: Diagnosis not present

## 2018-10-11 DIAGNOSIS — R0902 Hypoxemia: Secondary | ICD-10-CM | POA: Diagnosis not present

## 2018-10-11 LAB — URINALYSIS, ROUTINE W REFLEX MICROSCOPIC
Bilirubin Urine: NEGATIVE
Glucose, UA: NEGATIVE mg/dL
Hgb urine dipstick: NEGATIVE
Ketones, ur: NEGATIVE mg/dL
Leukocytes, UA: NEGATIVE
Nitrite: NEGATIVE
Protein, ur: NEGATIVE mg/dL
Specific Gravity, Urine: 1.004 — ABNORMAL LOW (ref 1.005–1.030)
pH: 5 (ref 5.0–8.0)

## 2018-10-11 LAB — BASIC METABOLIC PANEL
Anion gap: 9 (ref 5–15)
BUN: 14 mg/dL (ref 8–23)
CO2: 20 mmol/L — ABNORMAL LOW (ref 22–32)
Calcium: 8.6 mg/dL — ABNORMAL LOW (ref 8.9–10.3)
Chloride: 108 mmol/L (ref 98–111)
Creatinine, Ser: 1.4 mg/dL — ABNORMAL HIGH (ref 0.44–1.00)
GFR calc Af Amer: 44 mL/min — ABNORMAL LOW (ref >60)
GFR calc non Af Amer: 38 mL/min — ABNORMAL LOW (ref >60)
Glucose, Bld: 118 mg/dL — ABNORMAL HIGH (ref 70–99)
Potassium: 4.3 mmol/L (ref 3.5–5.1)
Sodium: 137 mmol/L (ref 135–145)

## 2018-10-11 LAB — CBC
HCT: 35 % — ABNORMAL LOW (ref 36.0–46.0)
Hemoglobin: 11.1 g/dL — ABNORMAL LOW (ref 12.0–15.0)
MCH: 30.2 pg (ref 26.0–34.0)
MCHC: 31.7 g/dL (ref 30.0–36.0)
MCV: 95.4 fL (ref 80.0–100.0)
Platelets: 196 10*3/uL (ref 150–400)
RBC: 3.67 MIL/uL — ABNORMAL LOW (ref 3.87–5.11)
RDW: 13.2 % (ref 11.5–15.5)
WBC: 9.3 10*3/uL (ref 4.0–10.5)
nRBC: 0 % (ref 0.0–0.2)

## 2018-10-11 LAB — CBG MONITORING, ED: Glucose-Capillary: 93 mg/dL (ref 70–99)

## 2018-10-11 NOTE — ED Notes (Signed)
ED Provider at bedside. 

## 2018-10-11 NOTE — ED Notes (Signed)
Pt ambulated to bathroom with no distress. Pt stated she didn't feel dizzy

## 2018-10-11 NOTE — ED Notes (Signed)
Patient verbalizes understanding of discharge instructions. Opportunity for questioning and answers were provided. Armband removed by staff, pt discharged from ED in wheelchair.  

## 2018-10-11 NOTE — ED Triage Notes (Signed)
Pt arrives to ED from home with complaints of generalized weakness since a week ago. EMS reports pt ekg was in bigeminy in route and had frequent PVCs. She has been feeling bad for the last week. Pt has been feeling worse past 3 days. Pt denies CP or Short of breath. Pt reports she starts to see spots and has bilateral eye pain at night. Pt placed in position of comfort with bed locked and lowered, call bell in reach.

## 2018-10-11 NOTE — ED Notes (Signed)
CBG 93 

## 2018-10-24 NOTE — ED Provider Notes (Signed)
[facility] EMERGENCY DEPARTMENT Provider Note   CSN: [id number] Arrival date & time: 10/11/18  1817     History   Chief Complaint Chief Complaint  Patient presents with  . Weakness    HPI [name] is a 73 y.o. female.  HPI   73 year old female with generalized weakness.  Onset about a week ago.  Persistent since then.  Symptoms are wax and wane no.  Seems like it is been worsening the past few days.  Denies any acute pain.  No acute respiratory complaints.  Several other complaints including mild headaches and eye pain particularly at night.  No fevers.  No chills.  No neck pain or stiffness.  Sometimes she will see floaters but no change in visual acuity.  Past Medical History:  Diagnosis Date  . Anxiety   . Arthropathy, unspecified, site unspecified   . Blind loop syndrome   . Carotid artery disease ([place])    documented occlusion of left ICA  . Coronary artery disease    a. known 60-70% mid-LAD stenosis with caths in 1999, 2002, 2006, and 2012 showing stable anatomy b. low-risk NST in 10/2015  . Depression   . Diabetes insipidus ([place])   . Diabetes mellitus   . Diverticulosis   . Dizziness    chronic  . Dyslipidemia   . Dyspnea    with exertion  . Fatty liver   . GERD (gastroesophageal reflux disease)    hiatial hernia  . Hepatomegaly   . Hiatal hernia   . HTN (hypertension)   . Hyperlipidemia   . Hypertension   . Irritable bowel syndrome   . Melanosis coli   . Metatarsal fracture right  . Normal nuclear stress test    nml 01/03/10;06/19/07  . Other, mixed, or unspecified nondependent drug abuse, unspecified   . Pancreatitis, chronic ([place])   . Peripheral vascular disease ([place]) 02/20/10   s/p stent of left SFA  . Sleep apnea   . Stroke ([place])   . Thyroid nodule   . Unspecified essential hypertension   . Vitamin B12 deficiency     Patient Active Problem List   Diagnosis Date Noted  . Ileostomy in place [facility]) 10/23/2017  . Pressure  injury of skin 10/19/2017  . 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  Ileostomy 10/18/2017 10/14/2017  . Ischemic colitis ([place]) 10/14/2017  . Acute respiratory failure with hypoxemia ([place]) 10/14/2017  . Acute post-operative pain 10/14/2017  . AKI (acute kidney injury) ([place]) 10/14/2017  . Anemia 10/14/2017  . Nocturia more than twice per night 05/07/2017  . Diabetes due to underlying condition w oth circulatory comp ([place]) 05/07/2017  . Atherosclerotic PVD with intermittent claudication ([place]) 05/07/2017  . COPD (chronic obstructive pulmonary disease) ([place]) 05/07/2017  . Poor sleep hygiene 05/07/2017  . 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 Hilar lymphadenopathy   . Hemoptysis 02/19/2017  . Pulmonary nodules/lesions, multiple 02/19/2017  . Tobacco use 02/19/2017  . RUQ abdominal pain 01/13/2017  . Pancreatic insufficiency 01/13/2017  . Left-sided extracranial carotid artery occlusion 11/26/2016  . PAOD (peripheral arterial occlusive disease) ([place]) 11/26/2016  . Encounter for routine follow-up after surgery of the circulatory system 11/26/2016  . Chest pain with moderate risk of acute coronary syndrome 10/27/2015  . Type 2 diabetes mellitus ([place]) 10/27/2015  . Occlusion and stenosis of carotid artery without mention of cerebral infarction 04/14/2012  . Chronic pancreatitis ([place] 04/30/2011  . Vitamin B12 deficiency 04/30/2011  . 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  Fatty liver 04/30/2011  . Obesity 04/30/2011  . Abdominal pain 04/09/2011  . Iron deficiency anemia, unspecified  04/09/2011  . Other general symptoms  04/09/2011  . DM (diabetes mellitus screen) 04/09/2011  . Asthma 04/09/2011  . Coronary artery disease   . Dyslipidemia   . Dizziness   . Right-sided extracranial carotid artery stenosis   . Depression   . Peripheral vascular disease ([place])   . Normal nuclear stress test   . B12 DEFICIENCY 03/19/2010  . DVT 03/16/2010  . BLIND LOOP SYNDROME  03/16/2010  . DIARRHEA 03/16/2010  . LAXATIVE ABUSE 12/17/2007  . Essential hypertension 12/17/2007  . GERD 12/17/2007  . HIATAL HERNIA 12/17/2007  . IBS 12/17/2007  . MELANOSIS COLI 12/17/2007  . ARTHRITIS 12/17/2007  . HEPATOMEGALY 12/17/2007    Past Surgical History:  Procedure Laterality Date  . ABDOMINAL HYSTERECTOMY     partial  . AUGMENTATION MAMMAPLASTY     bilateral 1976 retro   . BREAST ENHANCEMENT SURGERY    . CARDIAC CATHETERIZATION  07/07/98;08/31/01;03/04/05   60 -70% LAD  . CATARACT EXTRACTION    . COLON RESECTION N/A 10/14/2017   Procedure: EXPLORATORY LAPAROTOMY, EXTENDED RIGHT COLECTOMY; APPLICATION OF ABDOMINAL VACUUM DRESSING;  Surgeon: Jovita Kussmaul, MD;  Location: [facility] ORS;  Service: General;  Laterality: N/A;  . ENDOBRONCHIAL ULTRASOUND Bilateral 02/24/2017   Procedure: ENDOBRONCHIAL ULTRASOUND;  Surgeon: Collene Gobble, MD;  Location: [facility] ENDOSCOPY;  Service: Cardiopulmonary;  Laterality: Bilateral;  . EYE SURGERY Left Nov. 2016   Cataract  . EYE SURGERY Right 2001   Cataract  . FEMORAL ARTERY STENT     Left leg  . LAPAROTOMY N/A 10/16/2017   Procedure: EXPLORATORY LAPAROTOMY, PARTIAL OMENTECTOMY, RESECTION ISCHEMIC ILEUM 563OV, APPLICATION OF VAC ABDOMINAL DRESSING;  Surgeon: Armandina Gemma, MD;  Location: [facility];  Service: General;  Laterality: N/A;  . LAPAROTOMY N/A 10/18/2017   Procedure: RE-EXPLORATION OF ABDOMEN, ILEOSTOMY CREATION;  Surgeon: Stark Klein, MD;  Location: [facility];  Service: General;  Laterality: N/A;  . LUNG SURGERY  03/2017   Benign polyps removed  . PATELLA REALIGNMENT Left   . TONSILLECTOMY    . TOTAL ABDOMINAL HYSTERECTOMY    . WRIST SURGERY     right     OB History   No obstetric history on file.      Home Medications    Prior to Admission medications   Medication Sig Start Date End Date Taking? Authorizing Provider  acetaminophen (TYLENOL) 325 MG tablet Take 650 mg by mouth every 6 (six) hours as needed for headache  (pain).   Yes [provider]  amLODipine (NORVASC) 10 MG tablet Place 1 tablet (10 mg total) into feeding tube daily. Patient taking differently: Take 10 mg by mouth daily.  11/06/17  Yes Florencia Reasons, MD  ARIPiprazole (ABILIFY) 5 MG tablet Place 1 tablet (5 mg total) into feeding tube daily. Patient taking differently: Take 5 mg by mouth daily.  11/06/17  Yes Florencia Reasons, MD  atorvastatin (LIPITOR) 20 MG tablet Take 20 mg by mouth daily.   Yes [provider]  clopidogrel (PLAVIX) 75 MG tablet Take 75 mg by mouth daily.   Yes [provider]  esomeprazole (NEXIUM) 40 MG capsule Take 40 mg by mouth daily. 07/29/18  Yes [provider]  hydrOXYzine (ATARAX/VISTARIL) 25 MG tablet Take 25 mg by mouth at bedtime as needed (sleep).   Yes [provider]  loperamide (IMODIUM) 2 MG capsule Take 2 mg by mouth 3 (  three) times daily.   Yes [provider]  losartan (COZAAR) 25 MG tablet Take 1 tablet (25 mg total) by mouth daily. Patient taking differently: Take 12.5 mg by mouth daily.  04/09/18  Yes Pollina, Gwenyth Allegra, MD  metoprolol succinate (TOPROL-XL) 100 MG 24 hr tablet Take 100 mg by mouth daily. Take with or immediately following a meal.   Yes [provider]  mirtazapine (REMERON) 15 MG tablet Take 15 mg by mouth at bedtime.   Yes [provider]  montelukast (SINGULAIR) 10 MG tablet Take 10 mg by mouth daily. 08/09/18  Yes [provider]  nitroGLYCERIN (NITROSTAT) 0.4 MG SL tablet Place 1 tablet (0.4 mg total) under the tongue every 5 (five) minutes as needed for chest pain. 11/08/15  Yes [place], Peter M, MD  pantoprazole (PROTONIX) 40 MG tablet Take 40 mg by mouth daily.   Yes [provider]  Psyllium ([employer] NATURAL DAILY FIBER PO) Take 15 mLs by mouth See admin instructions. Mix one tablespoonful (15 mls) in water and drink by mouth twice daily   Yes [provider]  triamcinolone cream (KENALOG) 0.1 %  Apply 1 application topically 2 (two) times daily as needed (rash/itching).   Yes [provider]  venlafaxine XR (EFFEXOR-XR) 150 MG 24 hr capsule Take 150 mg by mouth daily.   Yes [provider]  vitamin B-12 (CYANOCOBALAMIN) 500 MCG tablet Take 500 mcg by mouth daily.   Yes [provider]  Vitamin D, Ergocalciferol, (DRISDOL) 1.25 MG (50000 UT) CAPS capsule Take 50,000 Units by mouth every Wednesday.    Yes [provider]  Dietary Management Product (VSL#3) PACK VSL# 3 900  Take two packets daily. Patient not taking: Reported on 10/11/2018 07/10/18   Willia Craze, NP  metoprolol tartrate (LOPRESSOR) 50 MG tablet Place 1 tablet (50 mg total) into feeding tube 2 (two) times daily. Patient not taking: Reported on 10/11/2018 11/06/17   Florencia Reasons, MD  Pancrelipase, Lip-Prot-Amyl, (ZENPEP) [phone] units CPEP Take 1 capsule by mouth 3 (three) times daily with meals.    [provider]  venlafaxine (EFFEXOR) 75 MG tablet Place 1 tablet (75 mg total) into feeding tube 2 (two) times daily with a meal. Patient not taking: Reported on 10/11/2018 11/06/17   Florencia Reasons, MD  Water For Irrigation, Sterile (FREE WATER) SOLN Place 200 mLs into feeding tube every 6 (six) hours. Patient not taking: Reported on 10/11/2018 11/06/17   Florencia Reasons, MD    Family History Family History  Problem Relation Age of Onset  . Hypertension Mother   . Osteoarthritis Mother   . Pulmonary embolism Mother   . Hypertension Father        aneurysm  . Stroke Father   . Stroke Other   . Colon cancer Paternal Grandfather     Social History Social History   Tobacco Use  . Smoking status: Former Smoker    Packs/day: 1.00    Years: 50.00    Pack years: 50.00    Types: Cigarettes    Last attempt to quit: 10/14/2010    Years since quitting: 8.0  . Smokeless tobacco: Never Used  Substance Use Topics  . Alcohol use: No  . Drug use: No     Allergies   Adhesive [tape];  Codeine; and Iodinated diagnostic agents   Review of Systems Review of Systems  All systems reviewed and negative, other than as noted in HPI.  Physical Exam Updated Vital Signs BP Marland Kitchen)  182/85   Pulse (!) 46   Temp 98.2 F (36.8 C) (Oral)   Resp 19   SpO2 95%   Physical Exam Vitals signs and nursing note reviewed.  Constitutional:      General: She is not in acute distress.    Appearance: She is well-developed.  HENT:     Head: Normocephalic and atraumatic.  Eyes:     General:        Right eye: No discharge.        Left eye: No discharge.     Conjunctiva/sclera: Conjunctivae normal.  Neck:     Musculoskeletal: Neck supple.  Cardiovascular:     Rate and Rhythm: Normal rate and regular rhythm.     Heart sounds: Normal heart sounds. No murmur. No friction rub. No gallop.   Pulmonary:     Effort: Pulmonary effort is normal. No respiratory distress.     Breath sounds: Normal breath sounds.  Abdominal:     General: There is no distension.     Palpations: Abdomen is soft.     Tenderness: There is no abdominal tenderness.  Musculoskeletal:        General: No tenderness.  Skin:    General: Skin is warm and dry.  Neurological:     Mental Status: She is alert.  Psychiatric:        Behavior: Behavior normal.        Thought Content: Thought content normal.      ED Treatments / Results  Labs (all labs ordered are listed, but only abnormal results are displayed) Labs Reviewed  BASIC METABOLIC PANEL - Abnormal; Notable for the following components:      Result Value   CO2 20 (*)    Glucose, Bld 118 (*)    Creatinine, Ser 1.40 (*)    Calcium 8.6 (*)    GFR calc non Af Amer 38 (*)    GFR calc Af Amer 44 (*)    All other components within normal limits  CBC - Abnormal; Notable for the following components:   RBC 3.67 (*)    Hemoglobin 11.1 (*)    HCT 35.0 (*)    All other components within normal limits  URINALYSIS, ROUTINE W REFLEX MICROSCOPIC - Abnormal; Notable  for the following components:   Color, Urine STRAW (*)    Specific Gravity, Urine 1.004 (*)    All other components within normal limits  CBG MONITORING, ED    EKG EKG Interpretation  Date/Time:  Sunday October 11 2018 18:25:04 EST Ventricular Rate:  68 PR Interval:    QRS Duration: 104 QT Interval:  388 QTC Calculation: 413 R Axis:   72 Text Interpretation:  Sinus rhythm Confirmed by Virgel Manifold [phone]) on 10/11/2018 7:52:10 PM Also confirmed by Virgel Manifold [phone]), editor Philomena Doheny [phone])  on 10/12/2018 1:03:27 PM   Radiology No results found.  Procedures Procedures (including critical care time)  Medications Ordered in ED Medications - No data to display   Initial Impression / Assessment and Plan / ED Course  I have reviewed the triage vital signs and the nursing notes.  Pertinent labs & imaging results that were available during my care of the patient were reviewed by me and considered in my medical decision making (see chart for details).      Final Clinical Impressions(s) / ED Diagnoses   Final diagnoses:  Generalized weakness    ED Discharge Orders    None       Lacreasha Hinds,  Annie Main, MD 10/24/18 [id number]

## 2018-11-03 ENCOUNTER — Emergency Department (HOSPITAL_COMMUNITY): Payer: 59

## 2018-11-03 ENCOUNTER — Other Ambulatory Visit: Payer: Self-pay

## 2018-11-03 ENCOUNTER — Observation Stay (HOSPITAL_COMMUNITY)
Admission: EM | Admit: 2018-11-03 | Discharge: 2018-11-05 | Disposition: A | Discharge status: Home / Self Care | Payer: 59 | Attending: Internal Medicine | Admitting: Internal Medicine

## 2018-11-03 ENCOUNTER — Encounter (HOSPITAL_COMMUNITY): Payer: Self-pay

## 2018-11-03 DIAGNOSIS — Z87891 Personal history of nicotine dependence: Secondary | ICD-10-CM | POA: Diagnosis not present

## 2018-11-03 DIAGNOSIS — R569 Unspecified convulsions: Principal | ICD-10-CM

## 2018-11-03 DIAGNOSIS — R531 Weakness: Secondary | ICD-10-CM | POA: Insufficient documentation

## 2018-11-03 DIAGNOSIS — K861 Other chronic pancreatitis: Secondary | ICD-10-CM | POA: Diagnosis not present

## 2018-11-03 DIAGNOSIS — K449 Diaphragmatic hernia without obstruction or gangrene: Secondary | ICD-10-CM | POA: Diagnosis not present

## 2018-11-03 DIAGNOSIS — G9389 Other specified disorders of brain: Secondary | ICD-10-CM | POA: Insufficient documentation

## 2018-11-03 DIAGNOSIS — I6522 Occlusion and stenosis of left carotid artery: Secondary | ICD-10-CM | POA: Diagnosis not present

## 2018-11-03 DIAGNOSIS — I2510 Atherosclerotic heart disease of native coronary artery without angina pectoris: Secondary | ICD-10-CM | POA: Insufficient documentation

## 2018-11-03 DIAGNOSIS — K55049 Acute infarction of large intestine, extent unspecified: Secondary | ICD-10-CM | POA: Diagnosis present

## 2018-11-03 DIAGNOSIS — E1122 Type 2 diabetes mellitus with diabetic chronic kidney disease: Secondary | ICD-10-CM | POA: Insufficient documentation

## 2018-11-03 DIAGNOSIS — E785 Hyperlipidemia, unspecified: Secondary | ICD-10-CM | POA: Insufficient documentation

## 2018-11-03 DIAGNOSIS — N183 Chronic kidney disease, stage 3 unspecified: Secondary | ICD-10-CM | POA: Diagnosis present

## 2018-11-03 DIAGNOSIS — K219 Gastro-esophageal reflux disease without esophagitis: Secondary | ICD-10-CM | POA: Diagnosis not present

## 2018-11-03 DIAGNOSIS — I10 Essential (primary) hypertension: Secondary | ICD-10-CM | POA: Diagnosis present

## 2018-11-03 DIAGNOSIS — K589 Irritable bowel syndrome without diarrhea: Secondary | ICD-10-CM | POA: Insufficient documentation

## 2018-11-03 DIAGNOSIS — I129 Hypertensive chronic kidney disease with stage 1 through stage 4 chronic kidney disease, or unspecified chronic kidney disease: Secondary | ICD-10-CM | POA: Diagnosis not present

## 2018-11-03 DIAGNOSIS — E1151 Type 2 diabetes mellitus with diabetic peripheral angiopathy without gangrene: Secondary | ICD-10-CM | POA: Diagnosis not present

## 2018-11-03 DIAGNOSIS — R2681 Unsteadiness on feet: Secondary | ICD-10-CM | POA: Diagnosis not present

## 2018-11-03 DIAGNOSIS — Z79899 Other long term (current) drug therapy: Secondary | ICD-10-CM | POA: Diagnosis not present

## 2018-11-03 DIAGNOSIS — G4730 Sleep apnea, unspecified: Secondary | ICD-10-CM | POA: Insufficient documentation

## 2018-11-03 DIAGNOSIS — F329 Major depressive disorder, single episode, unspecified: Secondary | ICD-10-CM | POA: Insufficient documentation

## 2018-11-03 DIAGNOSIS — R29898 Other symptoms and signs involving the musculoskeletal system: Secondary | ICD-10-CM | POA: Insufficient documentation

## 2018-11-03 DIAGNOSIS — D649 Anemia, unspecified: Secondary | ICD-10-CM | POA: Diagnosis not present

## 2018-11-03 DIAGNOSIS — Z8673 Personal history of transient ischemic attack (TIA), and cerebral infarction without residual deficits: Secondary | ICD-10-CM | POA: Insufficient documentation

## 2018-11-03 DIAGNOSIS — Z8249 Family history of ischemic heart disease and other diseases of the circulatory system: Secondary | ICD-10-CM | POA: Insufficient documentation

## 2018-11-03 DIAGNOSIS — R2689 Other abnormalities of gait and mobility: Secondary | ICD-10-CM | POA: Insufficient documentation

## 2018-11-03 DIAGNOSIS — I779 Disorder of arteries and arterioles, unspecified: Secondary | ICD-10-CM | POA: Diagnosis present

## 2018-11-03 DIAGNOSIS — F419 Anxiety disorder, unspecified: Secondary | ICD-10-CM | POA: Insufficient documentation

## 2018-11-03 DIAGNOSIS — Z7902 Long term (current) use of antithrombotics/antiplatelets: Secondary | ICD-10-CM | POA: Insufficient documentation

## 2018-11-03 HISTORY — DX: Unspecified convulsions: R56.9

## 2018-11-03 LAB — CBC WITH DIFFERENTIAL/PLATELET
Abs Immature Granulocytes: 0.15 10*3/uL — ABNORMAL HIGH (ref 0.00–0.07)
Basophils Absolute: 0.1 10*3/uL (ref 0.0–0.1)
Basophils Relative: 1 %
Eosinophils Absolute: 0.5 10*3/uL (ref 0.0–0.5)
Eosinophils Relative: 4 %
HCT: 41.3 % (ref 36.0–46.0)
Hemoglobin: 12.3 g/dL (ref 12.0–15.0)
Immature Granulocytes: 1 %
Lymphocytes Relative: 43 %
Lymphs Abs: 6.4 10*3/uL — ABNORMAL HIGH (ref 0.7–4.0)
MCH: 29.4 pg (ref 26.0–34.0)
MCHC: 29.8 g/dL — ABNORMAL LOW (ref 30.0–36.0)
MCV: 98.6 fL (ref 80.0–100.0)
Monocytes Absolute: 0.9 10*3/uL (ref 0.1–1.0)
Monocytes Relative: 7 %
Neutro Abs: 6.4 10*3/uL (ref 1.7–7.7)
Neutrophils Relative %: 44 %
Platelets: 223 10*3/uL (ref 150–400)
RBC: 4.19 MIL/uL (ref 3.87–5.11)
RDW: 12.9 % (ref 11.5–15.5)
WBC: 14.5 10*3/uL — ABNORMAL HIGH (ref 4.0–10.5)
nRBC: 0 % (ref 0.0–0.2)

## 2018-11-03 LAB — COMPREHENSIVE METABOLIC PANEL
ALT: 31 U/L (ref 0–44)
AST: 40 U/L (ref 15–41)
Albumin: 3.8 g/dL (ref 3.5–5.0)
Alkaline Phosphatase: 90 U/L (ref 38–126)
Anion gap: 19 — ABNORMAL HIGH (ref 5–15)
BUN: 14 mg/dL (ref 8–23)
CHLORIDE: 112 mmol/L — AB (ref 98–111)
CO2: 9 mmol/L — ABNORMAL LOW (ref 22–32)
CREATININE: 1.88 mg/dL — AB (ref 0.44–1.00)
Calcium: 9.6 mg/dL (ref 8.9–10.3)
GFR calc Af Amer: 31 mL/min — ABNORMAL LOW (ref >60)
GFR calc non Af Amer: 26 mL/min — ABNORMAL LOW (ref >60)
Glucose, Bld: 108 mg/dL — ABNORMAL HIGH (ref 70–99)
Potassium: 3.7 mmol/L (ref 3.5–5.1)
Sodium: 140 mmol/L (ref 135–145)
Total Bilirubin: 0.4 mg/dL (ref 0.3–1.2)
Total Protein: 7 g/dL (ref 6.5–8.1)

## 2018-11-03 LAB — CBG MONITORING, ED: GLUCOSE-CAPILLARY: 93 mg/dL (ref 70–99)

## 2018-11-03 LAB — PROTIME-INR
INR: 1.26
Prothrombin Time: 15.7 seconds — ABNORMAL HIGH (ref 11.4–15.2)

## 2018-11-03 LAB — MAGNESIUM: Magnesium: 1.7 mg/dL (ref 1.7–2.4)

## 2018-11-03 LAB — PHOSPHORUS: Phosphorus: 6.1 mg/dL — ABNORMAL HIGH (ref 2.5–4.6)

## 2018-11-03 LAB — I-STAT TROPONIN, ED: Troponin i, poc: 0.02 ng/mL (ref 0.00–0.08)

## 2018-11-03 LAB — ETHANOL: Alcohol, Ethyl (B): <10 mg/dL (ref <10)

## 2018-11-03 MED ORDER — LEVETIRACETAM IN NACL 1500 MG/100ML IV SOLN
1500.0000 mg | Freq: Once | INTRAVENOUS | Status: AC
  Administered 2018-11-03: 1500 mg via INTRAVENOUS
  Filled 2018-11-03: qty 100

## 2018-11-03 MED ORDER — SODIUM CHLORIDE 0.9 % IV BOLUS
1000.0000 mL | Freq: Once | INTRAVENOUS | Status: AC
  Administered 2018-11-03: 1000 mL via INTRAVENOUS

## 2018-11-03 NOTE — ED Notes (Signed)
Pt not post ictal any more. Pt A&Ox4 answering questions appropriately.

## 2018-11-03 NOTE — ED Triage Notes (Signed)
Pt here for activated code stroke due to generalized weakness.  Upon EMS arrival patient was in a postictal stated then seized again which EMS treated with 5mg  IM Versed.  Pt has no hx of seizure on file nor any meds on file to treat seizures.  Pt following commands but very tired.

## 2018-11-03 NOTE — Consult Note (Signed)
Requesting Physician: Dr. Melina Copa    Chief Complaint: Seizure  History obtained from: Patient and Chart    HPI:                                                                                                                                       [name] is an 73 y.o. female with recent CVA, chronic Right ICA occlusion, coronary artery disease, hyperlipidemia, diabetes mellitus presents to the ED with 2 generalized tonic clonic seizures witnessed by her friend and then by EMS.   On arrival, patient alert and oriented on arrival to [facility], ER.  CT head was performed which showed no evidence of acute hemorrhage or abnormality.  She has no prior history of seizures.  Family history of seizures     Past Medical History:  Diagnosis Date  . Anxiety   . Arthropathy, unspecified, site unspecified   . Blind loop syndrome   . Carotid artery disease ([place])    documented occlusion of left ICA  . Coronary artery disease    a. known 60-70% mid-LAD stenosis with caths in 1999, 2002, 2006, and 2012 showing stable anatomy b. low-risk NST in 10/2015  . Depression   . Diabetes insipidus ([place])   . Diabetes mellitus   . Diverticulosis   . Dizziness    chronic  . Dyslipidemia   . Dyspnea    with exertion  . Fatty liver   . GERD (gastroesophageal reflux disease)    hiatial hernia  . Hepatomegaly   . Hiatal hernia   . HTN (hypertension)   . Hyperlipidemia   . Hypertension   . Irritable bowel syndrome   . Melanosis coli   . Metatarsal fracture right  . Normal nuclear stress test    nml 01/03/10;06/19/07  . Other, mixed, or unspecified nondependent drug abuse, unspecified   . Pancreatitis, chronic ([place])   . Peripheral vascular disease ([place]) 02/20/10   s/p stent of left SFA  . Sleep apnea   . Stroke ([place])   . Thyroid nodule   . Unspecified essential hypertension   . Vitamin B12 deficiency     Past Surgical History:  Procedure Laterality Date  . ABDOMINAL HYSTERECTOMY     partial   . AUGMENTATION MAMMAPLASTY     bilateral 1976 retro   . BREAST ENHANCEMENT SURGERY    . CARDIAC CATHETERIZATION  07/07/98;08/31/01;03/04/05   60 -70% LAD  . CATARACT EXTRACTION    . COLON RESECTION N/A 10/14/2017   Procedure: EXPLORATORY LAPAROTOMY, EXTENDED RIGHT COLECTOMY; APPLICATION OF ABDOMINAL VACUUM DRESSING;  Surgeon: Jovita Kussmaul, MD;  Location: [facility] ORS;  Service: General;  Laterality: N/A;  . ENDOBRONCHIAL ULTRASOUND Bilateral 02/24/2017   Procedure: ENDOBRONCHIAL ULTRASOUND;  Surgeon: Collene Gobble, MD;  Location: [facility] ENDOS[facility];  Service: Cardiopulmonary;  Laterality: Bilateral;  . EYE SURGERY Left Nov. 2016   Cataract  . EYE SURGERY Right 2001  Cataract  . FEMORAL ARTERY STENT     Left leg  . LAPAROTOMY N/A 10/16/2017   Procedure: EXPLORATORY LAPAROTOMY, PARTIAL OMENTECTOMY, RESECTION ISCHEMIC ILEUM 546FK, APPLICATION OF VAC ABDOMINAL DRESSING;  Surgeon: Armandina Gemma, MD;  Location: [facility];  Service: General;  Laterality: N/A;  . LAPAROTOMY N/A 10/18/2017   Procedure: RE-EXPLORATION OF ABDOMEN, ILEOSTOMY CREATION;  Surgeon: Stark Klein, MD;  Location: [facility];  Service: General;  Laterality: N/A;  . LUNG SURGERY  03/2017   Benign polyps removed  . PATELLA REALIGNMENT Left   . TONSILLECTOMY    . TOTAL ABDOMINAL HYSTERECTOMY    . WRIST SURGERY     right    Family History  Problem Relation Age of Onset  . Hypertension Mother   . Osteoarthritis Mother   . Pulmonary embolism Mother   . Hypertension Father        aneurysm  . Stroke Father   . Stroke Other   . Colon cancer Paternal Grandfather    Social History:  reports that she quit smoking about 8 years ago. Her smoking use included cigarettes. She has a 50.00 pack-year smoking history. She has never used smokeless tobacco. She reports that she does not drink alcohol or use drugs.  Allergies:  Allergies  Allergen Reactions  . Adhesive [Tape] Other (See Comments)    "Took off my skin"  . Codeine Other (See Comments)     Altered mental state   . Iodinated Diagnostic Agents Rash    Medications:                                                                                                                        I reviewed home medications   ROS:                                                                                                                                     14 systems reviewed and negative except above   Examination:  General: Appears well-developed and well-nourished.  Psych: Affect appropriate to situation Eyes: No scleral injection HENT: No OP obstrucion, swollen tongue with laceration  Head: Normocephalic.  Cardiovascular: Normal rate and regular rhythm.  Respiratory: Effort normal and breath sounds normal to anterior ascultation GI: Soft.  No distension. There is no tenderness.  Skin: WDI    Neurological Examination Mental Status: Somnolent but awake, oriented, thought content appropriate.  Speech slurred but fluent without evidence of aphasia. Able to follow 3 step commands without difficulty. Cranial Nerves: II: Visual fields grossly normal,  III,IV, VI: ptosis not present, extra-ocular motions intact bilaterally, pupils equal, round, reactive to light and accommodation V,VII: smile symmetric, facial light touch sensation normal bilaterally VIII: hearing normal bilaterally IX,X: uvula rises symmetrically XI: bilateral shoulder shrug XII: midline tongue extension Motor: Right : Upper extremity   4/5    Left:     Upper extremity   4/5  Lower extremity   4/5     Lower extremity   4/5 Tone and bulk:normal tone throughout; no atrophy noted Sensory: Pinprick and light touch intact throughout, bilaterally Deep Tendon Reflexes: 2+ and symmetric throughout Plantars: Right: downgoing   Left: downgoing Cerebellar: No obvious ataxia Gait: Not assessed   Lab  Results: Basic Metabolic Panel: Recent Labs  Lab 11/03/18 2217  NA 140  K 3.7  CL 112*  CO2 9*  GLUCOSE 108*  BUN 14  CREATININE 1.88*  CALCIUM 9.6  MG 1.7  PHOS 6.1*    CBC: Recent Labs  Lab 11/03/18 2217  WBC 14.5*  NEUTROABS 6.4  HGB 12.3  HCT 41.3  MCV 98.6  PLT 223    Coagulation Studies: Recent Labs    11/03/18 2217  LABPROT 15.7*  INR 1.26    Imaging: Ct Head Code Stroke Wo Contrast  Result Date: 11/03/2018 CLINICAL DATA:  Code stroke. 73 y/o F; seizures. Evaluate for stroke. EXAM: CT HEAD WITHOUT CONTRAST TECHNIQUE: Contiguous axial images were obtained from the base of the skull through the vertex without intravenous contrast. COMPARISON:  04/09/2018 CT head. 10/24/2017 MRI head. FINDINGS: Brain: Motion artifact partially obscuring the posterior fossa and anterior temporal lobes. No acute stroke, hemorrhage, or mass effect of the brain identified. No extra-axial collection, hydrocephalus, or herniation. Small chronic might matter infarcts are present within the left parietal lobe. Vascular: Circle[place]of[place] largely obscured by motion artifact. Skull: Normal. Negative for fracture or focal lesion. Sinuses/Orbits: No acute finding. Other: None. ASPECTS ([place] Stroke Program Early CT Score) - Ganglionic level infarction (caudate, lentiform nuclei, internal capsule, insula, M1-M3 cortex): 7 - Supraganglionic infarction (M4-M6 cortex): 3 Total score (0-10 with 10 being normal): 10 IMPRESSION: Motion artifact partially obscuring posterior fossa and anterior temporal lobes. 1. No acute intracranial abnormality identified. 2. ASPECTS is 10. 3. Small chronic might matter infarcts within left parietal lobe. These results were called by telephone at the time of interpretation on 11/03/2018 at 10:28 pm to Dr. Aletta Edouard , who verbally acknowledged these results. Electronically Signed   By: Kristine Garbe M.D.   On: 11/03/2018 22:29     I have reviewed the above  imaging : CT head showed no acute abnormality   ASSESSMENT AND PLAN  73 y.o. female with recent CVA, chronic Right ICA occlusion, coronary artery disease, hyperlipidemia, diabetes mellitus presents to the ED with 2 generalized tonic clonic seizures.  Normal sodium and no evidence of infection.  Patient currently back to baseline.   Generalized tonic-clonic seizure-new onset   Recommendations New Keppra  500 mg twice daily MRI brain Seizure precautions Ativan 1 mg as needed for seizure greater than 2 minutes Lidocaine jelly for tongue bite    Per [facility] statutes, patients with seizures are not allowed to drive until they have been seizure-free for six months. Use caution when using heavy equipment or power tools. Avoid working on ladders or at heights. Take showers instead of baths. Ensure the water temperature is not too high on the home water heater. Do not go swimming alone. Do not lock yourself in a room alone (i.e. bathroom). When caring for infants or small children, sit down when holding, feeding, or changing them to minimize risk of injury to the child in the event you have a seizure. Maintain good sleep hygiene. Avoid alcohol.    If [name] has another seizure, call 911 and bring them back to the ED if:       A.  The seizure lasts longer than 5 minutes.            B.  The patient doesn't wake shortly after the seizure or has new problems such as difficulty seeing, speaking or moving following the seizure       C.  The patient was injured during the seizure       D.  The patient has a temperature over 102 F (39C)       E.  The patient vomited during the seizure and now is having trouble breathing     [facility] Pager Number [phone]

## 2018-11-03 NOTE — ED Provider Notes (Addendum)
[facility] EMERGENCY DEPARTMENT Provider Note   CSN: [id number] Arrival date & time: 11/03/18  2210     History   Chief Complaint Chief Complaint  Patient presents with  . Code Stroke    HPI [name] is a 73 y.o. female.  She is brought in by EMS on a stroke activation.  She has prior history of strokes and is on Plavix.  She was with a female friend tonight and had an acute onset of tonic-clonic seizure.  EMS found her postictal and during transport she had another tonic-clonic seizure.  This aborted with 5 mg of Versed.  Currently she is awake but not alert and not answering questions or following commands.  No prior history of seizures that we are aware of. Level 5 caveat secondary to altered mental status.  The history is provided by the EMS personnel. The history is limited by the condition of the patient.  Seizures  Seizure activity on arrival: no   Seizure type:  Grand mal Preceding symptoms comment:  Unknown Episode characteristics: generalized shaking   Postictal symptoms: confusion   Return to baseline: no   Severity:  Unable to specify Number of seizures this episode:  2 Progression:  Improving Context comment:  Unknown PTA treatment:  Midazolam History of seizures: no     Past Medical History:  Diagnosis Date  . Anxiety   . Arthropathy, unspecified, site unspecified   . Blind loop syndrome   . Carotid artery disease ([place])    documented occlusion of left ICA  . Coronary artery disease    a. known 60-70% mid-LAD stenosis with caths in 1999, 2002, 2006, and 2012 showing stable anatomy b. low-risk NST in 10/2015  . Depression   . Diabetes insipidus ([place])   . Diabetes mellitus   . Diverticulosis   . Dizziness    chronic  . Dyslipidemia   . Dyspnea    with exertion  . Fatty liver   . GERD (gastroesophageal reflux disease)    hiatial hernia  . Hepatomegaly   . Hiatal hernia   . HTN (hypertension)   . Hyperlipidemia   .  Hypertension   . Irritable bowel syndrome   . Melanosis coli   . Metatarsal fracture right  . Normal nuclear stress test    nml 01/03/10;06/19/07  . Other, mixed, or unspecified nondependent drug abuse, unspecified   . Pancreatitis, chronic ([place])   . Peripheral vascular disease ([place]) 02/20/10   s/p stent of left SFA  . Sleep apnea   . Stroke ([place])   . Thyroid nodule   . Unspecified essential hypertension   . Vitamin B12 deficiency     Patient Active Problem List   Diagnosis Date Noted  . Ileostomy in place [facility]) 10/23/2017  . Pressure injury of skin 10/19/2017  . 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  Ileostomy 10/18/2017 10/14/2017  . Ischemic colitis ([place]) 10/14/2017  . Acute respiratory failure with hypoxemia ([place]) 10/14/2017  . Acute post-operative pain 10/14/2017  . AKI (acute kidney injury) ([place]) 10/14/2017  . Anemia 10/14/2017  . Nocturia more than twice per night 05/07/2017  . Diabetes due to underlying condition w oth circulatory comp ([place]) 05/07/2017  . Atherosclerotic PVD with intermittent claudication ([place]) 05/07/2017  . COPD (chronic obstructive pulmonary disease) ([place]) 05/07/2017  . Poor sleep hygiene 05/07/2017  . 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 Hilar lymphadenopathy   . Hemoptysis 02/19/2017  . Pulmonary nodules/lesions, multiple 02/19/2017  . Tobacco  use 02/19/2017  . RUQ abdominal pain 01/13/2017  . Pancreatic insufficiency 01/13/2017  . Left-sided extracranial carotid artery occlusion 11/26/2016  . PAOD (peripheral arterial occlusive disease) ([place]) 11/26/2016  . Encounter for routine follow-up after surgery of the circulatory system 11/26/2016  . Chest pain with moderate risk of acute coronary syndrome 10/27/2015  . Type 2 diabetes mellitus ([place]) 10/27/2015  . Occlusion and stenosis of carotid artery without mention of cerebral infarction 04/14/2012  . Chronic pancreatitis ([place]) 04/30/2011  . Vitamin B12 deficiency 04/30/2011  .  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 Fatty liver 04/30/2011  . Obesity 04/30/2011  . Abdominal pain 04/09/2011  . Iron deficiency anemia, unspecified  04/09/2011  . Other general symptoms  04/09/2011  . DM (diabetes mellitus screen) 04/09/2011  . Asthma 04/09/2011  . Coronary artery disease   . Dyslipidemia   . Dizziness   . Right-sided extracranial carotid artery stenosis   . Depression   . Peripheral vascular disease ([place])   . Normal nuclear stress test   . B12 DEFICIENCY 03/19/2010  . DVT 03/16/2010  . BLIND LOOP SYNDROME 03/16/2010  . DIARRHEA 03/16/2010  . LAXATIVE ABUSE 12/17/2007  . Essential hypertension 12/17/2007  . GERD 12/17/2007  . HIATAL HERNIA 12/17/2007  . IBS 12/17/2007  . MELANOSIS COLI 12/17/2007  . ARTHRITIS 12/17/2007  . HEPATOMEGALY 12/17/2007    Past Surgical History:  Procedure Laterality Date  . ABDOMINAL HYSTERECTOMY     partial  . AUGMENTATION MAMMAPLASTY     bilateral 1976 retro   . BREAST ENHANCEMENT SURGERY    . CARDIAC CATHETERIZATION  07/07/98;08/31/01;03/04/05   60 -70% LAD  . CATARACT EXTRACTION    . COLON RESECTION N/A 10/14/2017   Procedure: EXPLORATORY LAPAROTOMY, EXTENDED RIGHT COLECTOMY; APPLICATION OF ABDOMINAL VACUUM DRESSING;  Surgeon: Jovita Kussmaul, MD;  Location: [facility] ORS;  Service: General;  Laterality: N/A;  . ENDOBRONCHIAL ULTRASOUND Bilateral 02/24/2017   Procedure: ENDOBRONCHIAL ULTRASOUND;  Surgeon: Collene Gobble, MD;  Location: [facility] ENDOSCOPY;  Service: Cardiopulmonary;  Laterality: Bilateral;  . EYE SURGERY Left Nov. 2016   Cataract  . EYE SURGERY Right 2001   Cataract  . FEMORAL ARTERY STENT     Left leg  . LAPAROTOMY N/A 10/16/2017   Procedure: EXPLORATORY LAPAROTOMY, PARTIAL OMENTECTOMY, RESECTION ISCHEMIC ILEUM 662HU, APPLICATION OF VAC ABDOMINAL DRESSING;  Surgeon: Armandina Gemma, MD;  Location: [facility] ORS;  Service: General;  Laterality: N/A;  . LAPAROTOMY N/A 10/18/2017   Procedure: RE-EXPLORATION OF ABDOMEN,  ILEOSTOMY CREATION;  Surgeon: Stark Klein, MD;  Location: [facility] ORS;  Service: General;  Laterality: N/A;  . LUNG SURGERY  03/2017   Benign polyps removed  . PATELLA REALIGNMENT Left   . TONSILLECTOMY    . TOTAL ABDOMINAL HYSTERECTOMY    . WRIST SURGERY     right     OB History   No obstetric history on file.      Home Medications    Prior to Admission medications   Medication Sig Start Date End Date Taking? Authorizing Provider  acetaminophen (TYLENOL) 325 MG tablet Take 650 mg by mouth every 6 (six) hours as needed for headache (pain).    [provider]  amLODipine (NORVASC) 10 MG tablet Place 1 tablet (10 mg total) into feeding tube daily. Patient taking differently: Take 10 mg by mouth daily.  11/06/17   Florencia Reasons, MD  ARIPiprazole (ABILIFY) 5 MG tablet Place 1 tablet (5 mg total) into feeding tube daily. Patient taking differently: Take 5 mg by  mouth daily.  11/06/17   Florencia Reasons, MD  atorvastatin (LIPITOR) 20 MG tablet Take 20 mg by mouth daily.    [provider]  clopidogrel (PLAVIX) 75 MG tablet Take 75 mg by mouth daily.    [provider]  Dietary Management Product (VSL#3) PACK VSL# 3 900  Take two packets daily. Patient not taking: Reported on 10/11/2018 07/10/18   Willia Craze, NP  esomeprazole (NEXIUM) 40 MG capsule Take 40 mg by mouth daily. 07/29/18   [provider]  hydrOXYzine (ATARAX/VISTARIL) 25 MG tablet Take 25 mg by mouth at bedtime as needed (sleep).    [provider]  loperamide (IMODIUM) 2 MG capsule Take 2 mg by mouth 3 (three) times daily.    [provider]  losartan (COZAAR) 25 MG tablet Take 1 tablet (25 mg total) by mouth daily. Patient taking differently: Take 12.5 mg by mouth daily.  04/09/18   Orpah Greek, MD  metoprolol succinate (TOPROL-XL) 100 MG 24 hr tablet Take 100 mg by mouth daily. Take with or immediately following a meal.    [provider]  metoprolol tartrate  (LOPRESSOR) 50 MG tablet Place 1 tablet (50 mg total) into feeding tube 2 (two) times daily. Patient not taking: Reported on 10/11/2018 11/06/17   Florencia Reasons, MD  mirtazapine (REMERON) 15 MG tablet Take 15 mg by mouth at bedtime.    [provider]  montelukast (SINGULAIR) 10 MG tablet Take 10 mg by mouth daily. 08/09/18   [provider]  nitroGLYCERIN (NITROSTAT) 0.4 MG SL tablet Place 1 tablet (0.4 mg total) under the tongue every 5 (five) minutes as needed for chest pain. 11/08/15   [place], Peter M, MD  Pancrelipase, Lip-Prot-Amyl, (ZENPEP) [phone] units CPEP Take 1 capsule by mouth 3 (three) times daily with meals.    [provider]  pantoprazole (PROTONIX) 40 MG tablet Take 40 mg by mouth daily.    [provider]  Psyllium ([employer] NATURAL DAILY FIBER PO) Take 15 mLs by mouth See admin instructions. Mix one tablespoonful (15 mls) in water and drink by mouth twice daily    [provider]  triamcinolone cream (KENALOG) 0.1 % Apply 1 application topically 2 (two) times daily as needed (rash/itching).    [provider]  venlafaxine (EFFEXOR) 75 MG tablet Place 1 tablet (75 mg total) into feeding tube 2 (two) times daily with a meal. Patient not taking: Reported on 10/11/2018 11/06/17   Florencia Reasons, MD  venlafaxine XR (EFFEXOR-XR) 150 MG 24 hr capsule Take 150 mg by mouth daily.    [provider]  vitamin B-12 (CYANOCOBALAMIN) 500 MCG tablet Take 500 mcg by mouth daily.    [provider]  Vitamin D, Ergocalciferol, (DRISDOL) 1.25 MG (50000 UT) CAPS capsule Take 50,000 Units by mouth every Wednesday.     [provider]  Water For Irrigation, Sterile (FREE WATER) SOLN Place 200 mLs into feeding tube every 6 (six) hours. Patient not taking: Reported on 10/11/2018 11/06/17   Florencia Reasons, MD    Family History Family History  Problem Relation Age of Onset  . Hypertension Mother   . Osteoarthritis Mother   . Pulmonary  embolism Mother   . Hypertension Father        aneurysm  . Stroke Father   . Stroke Other   . Colon cancer Paternal Grandfather     Social History Social History   Tobacco Use  . Smoking status: Former Smoker  Packs/day: 1.00    Years: 50.00    Pack years: 50.00    Types: Cigarettes    Last attempt to quit: 10/14/2010    Years since quitting: 8.0  . Smokeless tobacco: Never Used  Substance Use Topics  . Alcohol use: No  . Drug use: No     Allergies   Adhesive [tape]; Codeine; and Iodinated diagnostic agents   Review of Systems Review of Systems  Unable to perform ROS: Mental status change  Neurological: Positive for seizures.     Physical Exam Updated Vital Signs BP 99/60   Pulse (!) 119   Temp 97.6 F (36.4 C) (Oral)   Resp 16   SpO2 97%   Physical Exam Vitals signs and nursing note reviewed.  Constitutional:      General: She is not in acute distress.    Appearance: She is well-developed.  HENT:     Head: Normocephalic and atraumatic.     Mouth/Throat:     Mouth: Mucous membranes are dry.     Comments: There is a little dried blood around her mouth and throat looks like she probably bit her tongue.  No active bleeding. Eyes:     Conjunctiva/sclera: Conjunctivae normal.  Neck:     Musculoskeletal: Neck supple.  Cardiovascular:     Rate and Rhythm: Regular rhythm. Tachycardia present.     Heart sounds: No murmur.  Pulmonary:     Effort: Pulmonary effort is normal. No respiratory distress.     Breath sounds: Normal breath sounds.  Abdominal:     Palpations: Abdomen is soft.     Tenderness: There is no abdominal tenderness. There is no guarding or rebound.    Musculoskeletal: Normal range of motion.        General: Signs of injury (Bruise on left hand.) present.  Skin:    General: Skin is warm and dry.     Capillary Refill: Capillary refill takes less than 2 seconds.  Neurological:     General: No focal deficit present.     Mental Status:  She is alert.     Comments: Patient is awake but not alert not answering questions.  She is moving all extremities and able to localize painful stimuli.  She is mumbling      ED Treatments / Results  Labs (all labs ordered are listed, but only abnormal results are displayed) Labs Reviewed  URINALYSIS, ROUTINE W REFLEX MICROSCOPIC - Abnormal; Notable for the following components:      Result Value   Color, Urine STRAW (*)    Specific Gravity, Urine 1.004 (*)    Hgb urine dipstick SMALL (*)    All other components within normal limits  CBC WITH DIFFERENTIAL/PLATELET - Abnormal; Notable for the following components:   WBC 14.5 (*)    MCHC 29.8 (*)    Lymphs Abs 6.4 (*)    Abs Immature Granulocytes 0.15 (*)    All other components within normal limits  PROTIME-INR - Abnormal; Notable for the following components:   Prothrombin Time 15.7 (*)    All other components within normal limits  PHOSPHORUS - Abnormal; Notable for the following components:   Phosphorus 6.1 (*)    All other components within normal limits  COMPREHENSIVE METABOLIC PANEL - Abnormal; Notable for the following components:   Chloride 112 (*)    CO2 9 (*)    Glucose, Bld 108 (*)    Creatinine, Ser 1.88 (*)    GFR calc non Af Wyvonnia Lora  26 (*)    GFR calc Af Amer 31 (*)    Anion gap 19 (*)    All other components within normal limits  RAPID URINE DRUG SCREEN, HOSP PERFORMED - Abnormal; Notable for the following components:   Benzodiazepines POSITIVE (*)    All other components within normal limits  CBC - Abnormal; Notable for the following components:   RBC 3.58 (*)    Hemoglobin 10.8 (*)    HCT 32.9 (*)    All other components within normal limits  COMPREHENSIVE METABOLIC PANEL - Abnormal; Notable for the following components:   Chloride 113 (*)    CO2 20 (*)    Glucose, Bld 106 (*)    Creatinine, Ser 1.64 (*)    Total Protein 5.7 (*)    Albumin 3.2 (*)    GFR calc non Af Amer 31 (*)    GFR calc Af Amer 36  (*)    All other components within normal limits  APTT  MAGNESIUM  ETHANOL  I-STAT TROPONIN, ED  CBG MONITORING, ED  CBG MONITORING, ED    EKG EKG Interpretation  Date/Time:  Tuesday November 03 2018 23:11:23 EST Ventricular Rate:  93 PR Interval:    QRS Duration: 105 QT Interval:  396 QTC Calculation: 493 R Axis:   81 Text Interpretation:  Sinus rhythm Borderline right axis deviation Borderline repolarization abnormality Borderline prolonged QT interval similar to prior 12/19 Confirmed by Aletta Edouard [phone]) on 11/03/2018 11:15:39 PM Also confirmed by Aletta Edouard [phone]), editor Philomena Doheny [phone])  on 11/04/2018 7:44:22 AM   Radiology Mr Brain Wo Contrast  Result Date: 11/04/2018 CLINICAL DATA:  Follow up code stroke. Generalized weakness, postictal. No history of seizures. History of LEFT ICA occlusion, hypertension, hyperlipidemia and stroke. EXAM: MRI HEAD WITHOUT CONTRAST TECHNIQUE: Multiplanar, multiecho pulse sequences of the brain and surrounding structures were obtained without intravenous contrast. COMPARISON:  CT HEAD November 03, 2018 and MRI head October 24, 2017 FINDINGS: INTRACRANIAL CONTENTS: No reduced diffusion to suggest acute ischemia. No susceptibility artifact to suggest hemorrhage. The ventricles and sulci are normal for patient's age. Small area LEFT parietal encephalomalacia. Scattered subcentimeter supratentorial and to lesser extent pontine white matter FLAIR T2 hyperintensities. No suspicious parenchymal signal, masses, mass effect. No abnormal extra-axial fluid collections. No extra-axial masses. VASCULAR: Chronic T2 bright signal LEFT internal carotid artery. SKULL AND UPPER CERVICAL SPINE: No abnormal sellar expansion. No suspicious calvarial bone marrow signal. Craniocervical junction maintained. SINUSES/ORBITS: The mastoid air-cells and included paranasal sinuses are well-aerated.The included ocular globes and orbital contents are non-suspicious.  Status post bilateral ocular lens implants. OTHER: None. IMPRESSION: 1. No acute intracranial process. 2. Chronically occluded LEFT ICA. 3. 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 Electronically Signed   By: Elon Alas M.D.   On: 11/04/2018 04:48   Ct Head Code Stroke Wo Contrast  Result Date: 11/03/2018 CLINICAL DATA:  Code stroke. 73 y/o F; seizures. Evaluate for stroke. EXAM: CT HEAD WITHOUT CONTRAST TECHNIQUE: Contiguous axial images were obtained from the base of the skull through the vertex without intravenous contrast. COMPARISON:  04/09/2018 CT head. 10/24/2017 MRI head. FINDINGS: Brain: Motion artifact partially obscuring the posterior fossa and anterior temporal lobes. No acute stroke, hemorrhage, or mass effect of the brain identified. No extra-axial collection, hydrocephalus, or herniation. Small chronic might matter infarcts are present within the left parietal lobe. Vascular: Circle-of-Willis largely obscured by motion artifact. Skull: Normal. Negative for fracture or focal lesion. Sinuses/Orbits:  No acute finding. Other: None. ASPECTS ([place] Stroke Program Early CT Score) - Ganglionic level infarction (caudate, lentiform nuclei, internal capsule, insula, M1-M3 cortex): 7 - Supraganglionic infarction (M4-M6 cortex): 3 Total score (0-10 with 10 being normal): 10 IMPRESSION: Motion artifact partially obscuring posterior fossa and anterior temporal lobes. 1. No acute intracranial abnormality identified. 2. ASPECTS is 10. 3. Small chronic might matter infarcts within left parietal lobe. These results were called by telephone at the time of interpretation on 11/03/2018 at 10:28 pm to Dr. Aletta Edouard , who verbally acknowledged these results. Electronically Signed   By: Kristine Garbe M.D.   On: 11/03/2018 22:29    Procedures .Critical Care Performed by: Hayden Rasmussen, MD Authorized by: Hayden Rasmussen, MD   Critical care provider  statement:    Critical care time (minutes):  45   Critical care time was exclusive of:  Separately billable procedures and treating other patients   Critical care was necessary to treat or prevent imminent or life-threatening deterioration of the following conditions:  CNS failure or compromise   Critical care was time spent personally by me on the following activities:  Discussions with consultants, evaluation of patient's response to treatment, examination of patient, ordering and performing treatments and interventions, ordering and review of laboratory studies, ordering and review of radiographic studies, pulse oximetry, re-evaluation of patient's condition, obtaining history from patient or surrogate, review of old charts and development of treatment plan with patient or surrogate   I assumed direction of critical care for this patient from another provider in my specialty: no     (including critical care time)  Medications Ordered in ED Medications  levETIRAcetam (KEPPRA) IVPB 1500 mg/ 100 mL premix (has no administration in time range)     Initial Impression / Assessment and Plan / ED Course  I have reviewed the triage vital signs and the nursing notes.  Pertinent labs & imaging results that were available during my care of the patient were reviewed by me and considered in my medical decision making (see chart for details).  Clinical Course as of Nov 04 1302  Tue Nov 03, 2018  2236 Reevaluated patient.  She is following commands now and seems more alert and conversant.  She does not know what happened.  She states she is never had a seizure before.  She denies any frequent alcohol and no street drugs.  She denies any pain right now.   [MB]  2237 She does have some blood around her mouth and it looks like she might have bitten her tongue but there is no active bleeding.   [MB]  2237 She is able to move all 4 extremities with no focal deficits.   [MB]  2244 Patient was evaluated by  Dr. Lorraine Lax from neurology on arrival.   [MB]  2348 Discussed with hospitalist Dr. Adrienne Mocha  who accepts for admission.   [MB]    Clinical Course User Index [MB] Hayden Rasmussen, MD     Final Clinical Impressions(s) / ED Diagnoses   Final diagnoses:  New onset seizure [facility])    ED Discharge Orders    None       Hayden Rasmussen, MD 11/04/18 1306    Hayden Rasmussen, MD 11/12/18 1021

## 2018-11-04 ENCOUNTER — Encounter (HOSPITAL_COMMUNITY): Payer: Self-pay | Admitting: Internal Medicine

## 2018-11-04 ENCOUNTER — Observation Stay (HOSPITAL_COMMUNITY): Payer: 59

## 2018-11-04 DIAGNOSIS — N183 Chronic kidney disease, stage 3 unspecified: Secondary | ICD-10-CM | POA: Diagnosis present

## 2018-11-04 DIAGNOSIS — R569 Unspecified convulsions: Secondary | ICD-10-CM | POA: Diagnosis not present

## 2018-11-04 DIAGNOSIS — I10 Essential (primary) hypertension: Secondary | ICD-10-CM | POA: Diagnosis not present

## 2018-11-04 DIAGNOSIS — K861 Other chronic pancreatitis: Secondary | ICD-10-CM

## 2018-11-04 DIAGNOSIS — D649 Anemia, unspecified: Secondary | ICD-10-CM

## 2018-11-04 HISTORY — DX: Unspecified convulsions: R56.9

## 2018-11-04 LAB — COMPREHENSIVE METABOLIC PANEL
ALT: 27 U/L (ref 0–44)
AST: 38 U/L (ref 15–41)
Albumin: 3.2 g/dL — ABNORMAL LOW (ref 3.5–5.0)
Alkaline Phosphatase: 73 U/L (ref 38–126)
Anion gap: 8 (ref 5–15)
BILIRUBIN TOTAL: 0.7 mg/dL (ref 0.3–1.2)
BUN: 14 mg/dL (ref 8–23)
CO2: 20 mmol/L — ABNORMAL LOW (ref 22–32)
Calcium: 9.1 mg/dL (ref 8.9–10.3)
Chloride: 113 mmol/L — ABNORMAL HIGH (ref 98–111)
Creatinine, Ser: 1.64 mg/dL — ABNORMAL HIGH (ref 0.44–1.00)
GFR calc Af Amer: 36 mL/min — ABNORMAL LOW (ref >60)
GFR calc non Af Amer: 31 mL/min — ABNORMAL LOW (ref >60)
Glucose, Bld: 106 mg/dL — ABNORMAL HIGH (ref 70–99)
Potassium: 4.4 mmol/L (ref 3.5–5.1)
Sodium: 141 mmol/L (ref 135–145)
Total Protein: 5.7 g/dL — ABNORMAL LOW (ref 6.5–8.1)

## 2018-11-04 LAB — RAPID URINE DRUG SCREEN, HOSP PERFORMED
Amphetamines: NOT DETECTED
BENZODIAZEPINES: POSITIVE — AB
Barbiturates: NOT DETECTED
Cocaine: NOT DETECTED
OPIATES: NOT DETECTED
Tetrahydrocannabinol: NOT DETECTED

## 2018-11-04 LAB — URINALYSIS, ROUTINE W REFLEX MICROSCOPIC
Bacteria, UA: NONE SEEN
Bilirubin Urine: NEGATIVE
Glucose, UA: NEGATIVE mg/dL
Ketones, ur: NEGATIVE mg/dL
Leukocytes, UA: NEGATIVE
Nitrite: NEGATIVE
Protein, ur: NEGATIVE mg/dL
Specific Gravity, Urine: 1.004 — ABNORMAL LOW (ref 1.005–1.030)
pH: 5 (ref 5.0–8.0)

## 2018-11-04 LAB — CBC
HEMATOCRIT: 32.9 % — AB (ref 36.0–46.0)
Hemoglobin: 10.8 g/dL — ABNORMAL LOW (ref 12.0–15.0)
MCH: 30.2 pg (ref 26.0–34.0)
MCHC: 32.8 g/dL (ref 30.0–36.0)
MCV: 91.9 fL (ref 80.0–100.0)
Platelets: 173 10*3/uL (ref 150–400)
RBC: 3.58 MIL/uL — ABNORMAL LOW (ref 3.87–5.11)
RDW: 13 % (ref 11.5–15.5)
WBC: 10.2 10*3/uL (ref 4.0–10.5)
nRBC: 0 % (ref 0.0–0.2)

## 2018-11-04 LAB — APTT: aPTT: 27 seconds (ref 24–36)

## 2018-11-04 MED ORDER — MAGIC MOUTHWASH W/LIDOCAINE: 2.0000 mL | Freq: Three times a day (TID) | ORAL | Status: DC | PRN

## 2018-11-04 MED ORDER — LEVETIRACETAM IN NACL 500 MG/100ML IV SOLN
500.0000 mg | Freq: Two times a day (BID) | INTRAVENOUS | Status: DC
  Administered 2018-11-04 – 2018-11-05 (×3): 500 mg via INTRAVENOUS
  Filled 2018-11-04 (×4): qty 100

## 2018-11-04 MED ORDER — MIRTAZAPINE 15 MG PO TABS
15.0000 mg | ORAL_TABLET | Freq: Every day | ORAL | Status: DC
  Administered 2018-11-04: 15 mg via ORAL
  Filled 2018-11-04: qty 1

## 2018-11-04 MED ORDER — ACETAMINOPHEN 160 MG/5ML PO SOLN: 650.0000 mg | ORAL | Status: DC | PRN

## 2018-11-04 MED ORDER — CLOPIDOGREL BISULFATE 75 MG PO TABS
75.0000 mg | ORAL_TABLET | Freq: Every day | ORAL | Status: DC
  Administered 2018-11-04 – 2018-11-05 (×2): 75 mg via ORAL
  Filled 2018-11-04 (×2): qty 1

## 2018-11-04 MED ORDER — VENLAFAXINE HCL ER 75 MG PO CP24
150.0000 mg | ORAL_CAPSULE | Freq: Every day | ORAL | Status: DC
  Administered 2018-11-04 – 2018-11-05 (×2): 150 mg via ORAL
  Filled 2018-11-04: qty 1
  Filled 2018-11-04: qty 2

## 2018-11-04 MED ORDER — HEPARIN SODIUM (PORCINE) 5000 UNIT/ML IJ SOLN
5000.0000 [IU] | Freq: Three times a day (TID) | INTRAMUSCULAR | Status: DC
  Administered 2018-11-04: 5000 [IU] via SUBCUTANEOUS
  Filled 2018-11-04 (×3): qty 1

## 2018-11-04 MED ORDER — ACETAMINOPHEN 325 MG PO TABS: 650.0000 mg | ORAL_TABLET | ORAL | Status: DC | PRN

## 2018-11-04 MED ORDER — PANCRELIPASE (LIP-PROT-AMYL) 36000-114000 UNITS PO CPEP
36000.0000 [IU] | ORAL_CAPSULE | Freq: Three times a day (TID) | ORAL | Status: DC
  Administered 2018-11-04 – 2018-11-05 (×5): 36000 [IU] via ORAL
  Filled 2018-11-04 (×7): qty 1

## 2018-11-04 MED ORDER — VITAMIN B-12 100 MCG PO TABS
500.0000 ug | ORAL_TABLET | Freq: Every day | ORAL | Status: DC
  Administered 2018-11-04 – 2018-11-05 (×2): 500 ug via ORAL
  Filled 2018-11-04: qty 1
  Filled 2018-11-04: qty 5

## 2018-11-04 MED ORDER — ATORVASTATIN CALCIUM 10 MG PO TABS
20.0000 mg | ORAL_TABLET | Freq: Every day | ORAL | Status: DC
  Administered 2018-11-04 – 2018-11-05 (×2): 20 mg via ORAL
  Filled 2018-11-04 (×2): qty 2

## 2018-11-04 MED ORDER — STROKE: EARLY STAGES OF RECOVERY BOOK: Freq: Once | Status: DC

## 2018-11-04 MED ORDER — ARIPIPRAZOLE 5 MG PO TABS
5.0000 mg | ORAL_TABLET | Freq: Every day | ORAL | Status: DC
  Administered 2018-11-04 – 2018-11-05 (×2): 5 mg via ORAL
  Filled 2018-11-04 (×2): qty 1

## 2018-11-04 MED ORDER — HYDROXYZINE HCL 25 MG PO TABS: 25.0000 mg | ORAL_TABLET | Freq: Every evening | ORAL | Status: DC | PRN

## 2018-11-04 MED ORDER — SODIUM CHLORIDE 0.9 % IV SOLN
INTRAVENOUS | Status: DC
  Administered 2018-11-04 (×2): via INTRAVENOUS

## 2018-11-04 MED ORDER — MAGIC MOUTHWASH W/LIDOCAINE: 2.0000 mL | Freq: Once | ORAL | Status: DC

## 2018-11-04 MED ORDER — NITROGLYCERIN 0.4 MG SL SUBL: 0.4000 mg | SUBLINGUAL_TABLET | SUBLINGUAL | Status: DC | PRN

## 2018-11-04 MED ORDER — PANTOPRAZOLE SODIUM 40 MG PO TBEC
40.0000 mg | DELAYED_RELEASE_TABLET | Freq: Every day | ORAL | Status: DC
  Administered 2018-11-04 – 2018-11-05 (×2): 40 mg via ORAL
  Filled 2018-11-04 (×2): qty 1

## 2018-11-04 MED ORDER — METOPROLOL SUCCINATE ER 100 MG PO TB24
100.0000 mg | ORAL_TABLET | Freq: Every day | ORAL | Status: DC
  Administered 2018-11-04 – 2018-11-05 (×2): 100 mg via ORAL
  Filled 2018-11-04 (×2): qty 1

## 2018-11-04 MED ORDER — MONTELUKAST SODIUM 10 MG PO TABS
10.0000 mg | ORAL_TABLET | Freq: Every day | ORAL | Status: DC
  Administered 2018-11-04 – 2018-11-05 (×2): 10 mg via ORAL
  Filled 2018-11-04 (×2): qty 1

## 2018-11-04 MED ORDER — AMLODIPINE BESYLATE 10 MG PO TABS
10.0000 mg | ORAL_TABLET | Freq: Every day | ORAL | Status: DC
  Administered 2018-11-04 – 2018-11-05 (×2): 10 mg via ORAL
  Filled 2018-11-04: qty 2
  Filled 2018-11-04: qty 1

## 2018-11-04 MED ORDER — ACETAMINOPHEN 650 MG RE SUPP: 650.0000 mg | RECTAL | Status: DC | PRN

## 2018-11-04 MED ORDER — HYDRALAZINE HCL 20 MG/ML IJ SOLN: 10.0000 mg | INTRAMUSCULAR | Status: DC | PRN

## 2018-11-04 NOTE — ED Notes (Signed)
Pt's colostomy bag is leaking. This RN Ship broker for A11 paperwork for replacement colostomy bag.

## 2018-11-04 NOTE — Progress Notes (Signed)
Patient admitted after midnight. Per chart  [name] is an 73 y.o. female with recent CVA, chronic Right ICA occlusion, coronary artery disease, hyperlipidemia, diabetes mellitus presented to the ED with 2 generalized tonic clonic seizures witnessed by her friend and then by EMS.   1. Seizure -new onset. MRI brain unremarkable.  Evaluated by Dr. Lorraine Lax who recommended keppra and EEG.  Patient received Keppra loading dose and neurology advised to keep patient on 500 mg IV Keppra twice daily for now check EEG as well as seizure precautions 2. Chronic renal disease stage IV with mild worsening of creatinine. Hold nephrotoxins. Gentle IV fluids. Monitor intake and output. Recheck in am. 3. Hypertension on amlodipine beta-blockers ARB on hold due to worsening renal function keep patient on PRN IV hydralazine. 4. Chronic anemia likely from renal disease. Hg 10.8 a little below baseline. may be dilutional. No s/sx of bleeding. Monitor closely 5. History of stroke on Plavix and statins. 6. History of chronic pancreatitis.  Denies any abdominal pain. 7. History of ischemic colitis requiring bowel resection previously with ileostomy.   PE: Gen: awake lethargic in no acute distress CV RRR no MGR Resp: normal effort BS clear Abd: ilieostomy with liquid brownish stool Neuro: lethargic oriented to self and place. Follow simple commands slowly. Speech slow but clear   Dyanne Carrel, NP

## 2018-11-04 NOTE — Consult Note (Signed)
[facility] Nurse ostomy follow up Patient receiving care in [facility] 5W31.  No family present.  Patient presents with an established ileostomy for which she has been independent in care.  Patient denies peristomal skin irritation, and reports that she uses a barrier ring and a one piece flat pouching system. Stoma type/location: RUQ, budded, red, moist Stomal assessment/size: approximately 1.5 inches viewed through existing pouch Peristomal assessment: Deferred, but intact per patient report Treatment options for stomal/peristomal skin: barrier ring Kellie Simmering # [phone]) Output:  Liquid green  Ostomy pouching: 1pc. Flat Kellie Simmering # [phone]) I have placed an order for supplies and ostomy care. Thank you for the consult.  Discussed plan of care with the patient and bedside nurse.  [facility] [place] nurse will not follow at this time.  Please re-consult the [facility] [place] team if needed.  Val Riles, RN, MSN, CWOCN, CNS-BC, pager [phone]

## 2018-11-04 NOTE — Progress Notes (Signed)
Subjective: No complaints. Does not recall events during or after the seizure until sometime during her ED stay. States she had a premonitory symptom of "feeling weird" prior to the seizure but cannot further specify.   Objective: Current vital signs: BP (!) 159/64 (BP Location: Right Arm)   Pulse 67   Temp 98 F (36.7 C) (Oral)   Resp 16   Ht 5\' 3"  (1.6 m)   Wt 70.1 kg   SpO2 100%   BMI 27.38 kg/m  Vital signs in last 24 hours: Temp:  [97.6 F (36.4 C)-98 F (36.7 C)] 98 F (36.7 C) (01/22 1301) Pulse Rate:  [67-119] 67 (01/22 1301) Resp:  [13-21] 16 (01/22 1301) BP: (99-175)/(55-88) 159/64 (01/22 1301) SpO2:  [97 %-100 %] 100 % (01/22 1301) Weight:  [70.1 kg] 70.1 kg (01/22 1301)  Intake/Output from previous day: 01/21 0701 - 01/22 0700 In: 1100 [IV Piggyback:1100] Out: -  Intake/Output this shift: No intake/output data recorded. Nutritional status:  Diet Order            Diet Heart Room service appropriate? Yes; Fluid consistency: Thin  Diet effective now             HEENT: Large chronic scar present to left forehead.  Lungs: Respirations unlabored Ext: Warm and well perfused  Neurologic Exam: Mental Status: Alert, fully oriented, thought content appropriate.  Speech fluent with intact comprehension. Able to follow all commands. Mild dysarthria. Somewhat anxious, dysthymic affect Cranial Nerves:  II:  Fixates normally  III,IV, VI: No protosis. EOM full with saccadic pursuits noted.  V,VII: Mild left facial droop VIII: hearing intact to voice IX,X: No hoarseness XI: Symmetric XII: Tongue with with deviation of the medial sulcus to the right, due to swelling from tongue bite: difficult to ascertain a true deviation Motor: Right : Upper extremity   5/5    Left:     Upper extremity   5/5  Lower extremity   5/5     Lower extremity   5/5 Sensory: Reacts to light tactile stimuli BLE Deep Tendon Reflexes:  Hypoactive patellar reflexes bilaterally  Cerebellar:   No tremor noted Gait: Deferred Other: No jerking, twitching or other adventitious movements noted  Lab Results: Results for orders placed or performed during the hospital encounter of 11/03/18 (from the past 48 hour(s))  CBG monitoring, ED     Status: None   Collection Time: 11/03/18 10:13 PM  Result Value Ref Range   Glucose-Capillary 93 70 - 99 mg/dL  CBC WITH DIFFERENTIAL     Status: Abnormal   Collection Time: 11/03/18 10:17 PM  Result Value Ref Range   WBC 14.5 (H) 4.0 - 10.5 K/uL   RBC 4.19 3.87 - 5.11 MIL/uL   Hemoglobin 12.3 12.0 - 15.0 g/dL   HCT 41.3 36.0 - 46.0 %   MCV 98.6 80.0 - 100.0 fL   MCH 29.4 26.0 - 34.0 pg   MCHC 29.8 (L) 30.0 - 36.0 g/dL   RDW 12.9 11.5 - 15.5 %   Platelets 223 150 - 400 K/uL   nRBC 0.0 0.0 - 0.2 %   Neutrophils Relative % 44 %   Neutro Abs 6.4 1.7 - 7.7 K/uL   Lymphocytes Relative 43 %   Lymphs Abs 6.4 (H) 0.7 - 4.0 K/uL   Monocytes Relative 7 %   Monocytes Absolute 0.9 0.1 - 1.0 K/uL   Eosinophils Relative 4 %   Eosinophils Absolute 0.5 0.0 - 0.5 K/uL   Basophils Relative 1 %  Basophils Absolute 0.1 0.0 - 0.1 K/uL   Immature Granulocytes 1 %   Abs Immature Granulocytes 0.15 (H) 0.00 - 0.07 K/uL    Comment: Performed at [facility], [place] [address] [place], [place] [address]  Protime-INR     Status: Abnormal   Collection Time: 11/03/18 10:17 PM  Result Value Ref Range   Prothrombin Time 15.7 (H) 11.4 - 15.2 seconds   INR 1.26     Comment: Performed at [facility] [address], [place], [place] [address]  Magnesium     Status: None   Collection Time: 11/03/18 10:17 PM  Result Value Ref Range   Magnesium 1.7 1.7 - 2.4 mg/dL    Comment: Performed at [facility], [place] [address] [place], [place] [address]  Phosphorus     Status: Abnormal   Collection Time: 11/03/18 10:17 PM  Result Value Ref Range   Phosphorus 6.1 (H) 2.5 - 4.6 mg/dL    Comment: Performed at [facility] [address]  [place], [place] [address]  Comprehensive metabolic panel     Status: Abnormal   Collection Time: 11/03/18 10:17 PM  Result Value Ref Range   Sodium 140 135 - 145 mmol/L   Potassium 3.7 3.5 - 5.1 mmol/L   Chloride 112 (H) 98 - 111 mmol/L   CO2 9 (L) 22 - 32 mmol/L   Glucose, Bld 108 (H) 70 - 99 mg/dL   BUN 14 8 - 23 mg/dL   Creatinine, Ser 1.88 (H) 0.44 - 1.00 mg/dL   Calcium 9.6 8.9 - 10.3 mg/dL   Total Protein 7.0 6.5 - 8.1 g/dL   Albumin 3.8 3.5 - 5.0 g/dL   AST 40 15 - 41 U/L   ALT 31 0 - 44 U/L   Alkaline Phosphatase 90 38 - 126 U/L   Total Bilirubin 0.4 0.3 - 1.2 mg/dL   GFR calc non Af Amer 26 (L) >60 mL/min   GFR calc Af Amer 31 (L) >60 mL/min   Anion gap 19 (H) 5 - 15    Comment: Performed at [facility], [place] [address], [place], [place] [address]  Ethanol/ETOH     Status: None   Collection Time: 11/03/18 10:17 PM  Result Value Ref Range   Alcohol, Ethyl (B) <10 <10 mg/dL    Comment: (NOTE) Lowest detectable limit for serum alcohol is 10 mg/dL. For medical purposes only. Performed at [facility], [place] [address], [place], [place] [address]   I-stat troponin, ED     Status: None   Collection Time: 11/03/18 10:24 PM  Result Value Ref Range   Troponin i, poc 0.02 0.00 - 0.08 ng/mL   Comment 3            Comment: Due to the release kinetics of cTnI, a negative result within the first hours of the onset of symptoms does not rule out myocardial infarction with certainty. If myocardial infarction is still suspected, repeat the test at appropriate intervals.   Urinalysis, Routine w reflex microscopic     Status: Abnormal   Collection Time: 11/04/18  1:18 AM  Result Value Ref Range   Color, Urine STRAW (A) YELLOW   APPearance CLEAR CLEAR   Specific Gravity, Urine 1.004 (L) 1.005 - 1.030   pH 5.0 5.0 - 8.0   Glucose, UA NEGATIVE NEGATIVE mg/dL   Hgb urine dipstick SMALL (A) NEGATIVE   Bilirubin Urine NEGATIVE NEGATIVE  Ketones, ur NEGATIVE NEGATIVE mg/dL    Protein, ur NEGATIVE NEGATIVE mg/dL   Nitrite NEGATIVE NEGATIVE   Leukocytes, UA NEGATIVE NEGATIVE   RBC / HPF 0-5 0 - 5 RBC/hpf   WBC, UA 0-5 0 - 5 WBC/hpf   Bacteria, UA NONE SEEN NONE SEEN   Squamous Epithelial / LPF 0-5 0 - 5   Mucus PRESENT    Hyaline Casts, UA PRESENT     Comment: Performed at [facility], [place] [address], [place], [place] [address]  APTT     Status: None   Collection Time: 11/04/18  3:42 AM  Result Value Ref Range   aPTT 27 24 - 36 seconds    Comment: Performed at [facility] [address], [place], [place] [address]  CBC     Status: Abnormal   Collection Time: 11/04/18  3:42 AM  Result Value Ref Range   WBC 10.2 4.0 - 10.5 K/uL   RBC 3.58 (L) 3.87 - 5.11 MIL/uL   Hemoglobin 10.8 (L) 12.0 - 15.0 g/dL   HCT 32.9 (L) 36.0 - 46.0 %   MCV 91.9 80.0 - 100.0 fL    Comment: REPEATED TO VERIFY DELTA CHECK NOTED    MCH 30.2 26.0 - 34.0 pg   MCHC 32.8 30.0 - 36.0 g/dL   RDW 13.0 11.5 - 15.5 %   Platelets 173 150 - 400 K/uL   nRBC 0.0 0.0 - 0.2 %    Comment: Performed at [facility], [place] [address] [place], [place] [address]  Comprehensive metabolic panel     Status: Abnormal   Collection Time: 11/04/18  3:42 AM  Result Value Ref Range   Sodium 141 135 - 145 mmol/L   Potassium 4.4 3.5 - 5.1 mmol/L   Chloride 113 (H) 98 - 111 mmol/L   CO2 20 (L) 22 - 32 mmol/L   Glucose, Bld 106 (H) 70 - 99 mg/dL   BUN 14 8 - 23 mg/dL   Creatinine, Ser 1.64 (H) 0.44 - 1.00 mg/dL   Calcium 9.1 8.9 - 10.3 mg/dL   Total Protein 5.7 (L) 6.5 - 8.1 g/dL   Albumin 3.2 (L) 3.5 - 5.0 g/dL   AST 38 15 - 41 U/L   ALT 27 0 - 44 U/L   Alkaline Phosphatase 73 38 - 126 U/L   Total Bilirubin 0.7 0.3 - 1.2 mg/dL   GFR calc non Af Amer 31 (L) >60 mL/min   GFR calc Af Amer 36 (L) >60 mL/min   Anion gap 8 5 - 15    Comment: Performed at [facility] [address], [place], [place] [address]    No results found for this or any previous visit (from the  past 240 hour(s)).  Lipid Panel No results for input(s): CHOL, TRIG, HDL, CHOLHDL, VLDL, LDLCALC in the last 72 hours.  Studies/Results: Mr Brain Wo Contrast  Result Date: 11/04/2018 CLINICAL DATA:  Follow up code stroke. Generalized weakness, postictal. No history of seizures. History of LEFT ICA occlusion, hypertension, hyperlipidemia and stroke. EXAM: MRI HEAD WITHOUT CONTRAST TECHNIQUE: Multiplanar, multiecho pulse sequences of the brain and surrounding structures were obtained without intravenous contrast. COMPARISON:  CT HEAD November 03, 2018 and MRI head October 24, 2017 FINDINGS: INTRACRANIAL CONTENTS: No reduced diffusion to suggest acute ischemia. No susceptibility artifact to suggest hemorrhage. The ventricles and sulci are normal for patient's age. Small area LEFT parietal encephalomalacia. Scattered subcentimeter supratentorial and to lesser extent  pontine white matter FLAIR T2 hyperintensities. No suspicious parenchymal signal, masses, mass effect. No abnormal extra-axial fluid collections. No extra-axial masses. VASCULAR: Chronic T2 bright signal LEFT internal carotid artery. SKULL AND UPPER CERVICAL SPINE: No abnormal sellar expansion. No suspicious calvarial bone marrow signal. Craniocervical junction maintained. SINUSES/ORBITS: The mastoid air-cells and included paranasal sinuses are well-aerated.The included ocular globes and orbital contents are non-suspicious. Status post bilateral ocular lens implants. OTHER: None. IMPRESSION: 1. No acute intracranial process. 2. Chronically occluded LEFT ICA. 3. 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 Electronically Signed   By: Elon Alas M.D.   On: 11/04/2018 04:48   Ct Head Code Stroke Wo Contrast  Result Date: 11/03/2018 CLINICAL DATA:  Code stroke. 73 y/o F; seizures. Evaluate for stroke. EXAM: CT HEAD WITHOUT CONTRAST TECHNIQUE: Contiguous axial images were obtained from the base of the skull  through the vertex without intravenous contrast. COMPARISON:  04/09/2018 CT head. 10/24/2017 MRI head. FINDINGS: Brain: Motion artifact partially obscuring the posterior fossa and anterior temporal lobes. No acute stroke, hemorrhage, or mass effect of the brain identified. No extra-axial collection, hydrocephalus, or herniation. Small chronic might matter infarcts are present within the left parietal lobe. Vascular: Circle-of-Willis largely obscured by motion artifact. Skull: Normal. Negative for fracture or focal lesion. Sinuses/Orbits: No acute finding. Other: None. ASPECTS ([place] Stroke Program Early CT Score) - Ganglionic level infarction (caudate, lentiform nuclei, internal capsule, insula, M1-M3 cortex): 7 - Supraganglionic infarction (M4-M6 cortex): 3 Total score (0-10 with 10 being normal): 10 IMPRESSION: Motion artifact partially obscuring posterior fossa and anterior temporal lobes. 1. No acute intracranial abnormality identified. 2. ASPECTS is 10. 3. Small chronic might matter infarcts within left parietal lobe. These results were called by telephone at the time of interpretation on 11/03/2018 at 10:28 pm to Dr. Aletta Edouard , who verbally acknowledged these results. Electronically Signed   By: Kristine Garbe M.D.   On: 11/03/2018 22:29    Medications:  Scheduled: .  stroke: mapping our early stages of recovery book   Does not apply Once  . amLODipine  10 mg Oral Daily  . ARIPiprazole  5 mg Oral Daily  . atorvastatin  20 mg Oral Daily  . clopidogrel  75 mg Oral Daily  . heparin  5,000 Units Subcutaneous Q8H  . lipase/protease/amylase  36,000 Units Oral TID WC  . magic mouthwash w/lidocaine  2 mL Oral Once  . metoprolol succinate  100 mg Oral Daily  . mirtazapine  15 mg Oral QHS  . montelukast  10 mg Oral Daily  . pantoprazole  40 mg Oral Daily  . venlafaxine XR  150 mg Oral Q breakfast  . vitamin B-12  500 mcg Oral Daily   Continuous: . sodium chloride 75 mL/hr at  11/04/18 1851  . levETIRAcetam Stopped (11/04/18 0915)    MRI revealed no acute intracranial process. There is a chronically occluded LEFT ICA and an old small LEFT parietal infarct and mild chronic small vessel ischemic changes.  EEG: EEG Abnormalities:1) Irregular focal left temporo-parietal slow activity.Clinical Interpretation:This EEG reveals evidence of a non-specific focal cerebral disturbance in the left temporo-parietal region. There was no seizure or seizure predisposition recorded on this study.  Assessment: 73 year old female with recent stroke who presented to the ED yesterday with 2 generalized tonic clonic seizures.She had a normal sodium and noevidence of infection.She was back to baseline at the time of the initial neurological assessment 1. Exam today shows mild left  facial droop and dysarthria. No evidence for seizures on neurological exam. 2. EEG with irregular focal left temporo-parietal slow activity, most likely secondary to the chronic left parietal lobe infarct seen on MRI  Recommendations: 1. Continue Keppra 500 mg BID 2. Per [facility] statutes, patients with seizures are not allowed to drive until  they have been seizure-free for six months. Use caution when using heavy equipment or power tools. Avoid working on ladders or at heights. Take showers instead of baths. Ensure the water temperature is not too high on the home water heater. Do not go swimming alone. When caring for infants or small children, sit down when holding, feeding, or changing them to minimize risk of injury to the child in the event you have a seizure. Also, Maintain good sleep hygiene. Avoid alcohol. 3. Outpatient Neurology follow up.  4. Neurohospitalist service will sign off. Please call if there are additional questions.      LOS: 0 days   @Electronically  signed: Dr. Kerney Elbe 11/04/2018  7:55 PM

## 2018-11-04 NOTE — Evaluation (Signed)
Occupational Therapy Evaluation Patient Details Name: [name] MRN: [id number] DOB: 1946-02-26 Today's Date: 11/04/2018    History of Present Illness  73 y.o. female with recent CVA, chronic Right ICA occlusion, coronary artery disease, hyperlipidemia, diabetes mellitus presented to the ED with 2 generalized tonic clonic seizures witnessed by her friend and then by EMS. MRI brain unremarkable.     Clinical Impression   Pt admitted with the above diagnoses and presents with below problem list. Pt will benefit from continued acute OT to address the below listed deficits and maximize independence with basic ADLs prior to d/c to venue below. PTA pt reports she was independent with ADLs, no family present to confirm. A&O person, place, year, recalls basics of what brought her here. Some slow processing noted. Very flat affect. No family present to compare cognition to baseline. Pt reports she lives alone with family nearby who can check in on her but not provide 24 hr assist/supervision. Pt presents with unsteadiness ambulating in the room which she states is not her baseline. Discussed ST rehab at SNF, pt at this time reports she wants to d/c home. Plan to follow her during her admission, monitor her progress with ADL goals, and will update recommendations as appropriate. Ideally, she will be mobilizing with improved stability (mod I)  prior to returning home alone.      Follow Up Recommendations  SNF;Supervision/Assistance - 24 hour; at this time pt wants to d/c home   Equipment Recommendations  3 in 1 bedside commode    Recommendations for Other Services PT consult     Precautions / Restrictions Precautions Precautions: Fall;Other (comment)(seizure) Required Braces or Orthoses: (colostomy) Restrictions Weight Bearing Restrictions: No      Mobility Bed Mobility Overal bed mobility: Modified Independent                Transfers Overall transfer level: Needs  assistance Equipment used: None Transfers: Sit to/from Stand Sit to Stand: Min guard         General transfer comment: to/from EOB and toilet at regular heights. Unsteady but no overt LOB. CLose min guard for safety.    Balance Overall balance assessment: Needs assistance Sitting-balance support: No upper extremity supported;Feet supported Sitting balance-Leahy Scale: Good Sitting balance - Comments: able to doff and don socks sitting EOB with no observable difficulty                                   ADL either performed or assessed with clinical judgement   ADL Overall ADL's : Needs assistance/impaired Eating/Feeding: Set up;Sitting   Grooming: Min guard;Standing   Upper Body Bathing: Set up;Sitting   Lower Body Bathing: Min guard;Minimal assistance;Sit to/from stand   Upper Body Dressing : Set up;Sitting   Lower Body Dressing: Min guard;Minimal assistance;Sit to/from stand   Toilet Transfer: Min guard;Ambulation;Regular Toilet;Grab bars   Toileting- Clothing Manipulation and Hygiene: Min guard;Sit to/from stand   Tub/ Shower Transfer: Tub transfer;Minimal assistance;Ambulation;Shower seat;Grab bars   Functional mobility during ADLs: Min guard General ADL Comments: Pt ambulated in the room at min guard level, unsteadiness noted but no overt LOB. Suspect min A with tub transfers and LB ADLs.     Vision         Perception     Praxis      Pertinent Vitals/Pain Pain Assessment: No/denies pain     Hand Dominance     Extremity/Trunk  Assessment Upper Extremity Assessment Upper Extremity Assessment: Generalized weakness;Overall [facility] for tasks assessed   Lower Extremity Assessment Lower Extremity Assessment: Defer to PT evaluation   Cervical / Trunk Assessment Cervical / Trunk Assessment: Kyphotic   Communication Communication Communication: Expressive difficulties(slow, mildly slurred speech)   Cognition Arousal/Alertness:  Awake/alert Behavior During Therapy: Flat affect Overall Cognitive Status: No family/caregiver present to determine baseline cognitive functioning                                 General Comments: A&O person, place, able to state year, recalls basics of what brought her here. Some slow processing noted. Very flat affect. No family present to compare to baseline   General Comments       Exercises     Shoulder Instructions      Home Living Family/patient expects to be discharged to:: Private residence Living Arrangements: Alone Available Help at Discharge: Family;Available PRN/intermittently Type of Home: House Home Access: Level entry     Home Layout: One level     Bathroom Shower/Tub: Teacher, early years/pre: Standard     Home Equipment: Walker - 2 wheels;Grab bars - toilet;Grab bars - tub/shower;Shower seat   Additional Comments: per pt report, no family present       Prior Functioning/Environment Level of Independence: Independent                 OT Problem List: Decreased strength;Decreased activity tolerance;Impaired balance (sitting and/or standing);Decreased coordination;Decreased safety awareness;Decreased knowledge of use of DME or AE;Decreased knowledge of precautions      OT Treatment/Interventions: Self-care/ADL training;Therapeutic exercise;Neuromuscular education;DME and/or AE instruction;Therapeutic activities;Cognitive remediation/compensation;Patient/family education;Balance training    OT Goals(Current goals can be found in the care plan section) Acute Rehab OT Goals Patient Stated Goal: discussed SNF for rehab, pt states she plans to d/c home OT Goal Formulation: With patient Time For Goal Achievement: 11/18/18 Potential to Achieve Goals: Good ADL Goals Pt Will Perform Grooming: with modified independence;standing Pt Will Perform Lower Body Bathing: with modified independence;sit to/from stand Pt Will Perform Lower  Body Dressing: with modified independence;sit to/from stand Pt Will Transfer to Toilet: with modified independence;ambulating Pt Will Perform Toileting - Clothing Manipulation and hygiene: with modified independence;sit to/from stand Pt Will Perform Tub/Shower Transfer: Tub transfer;with modified independence;ambulating;shower seat  OT Frequency: Min 3X/week   Barriers to D/C: Decreased caregiver support  from home alone with only PRN assist available       Co-evaluation              AM-PAC OT "6 Clicks" Daily Activity     Outcome Measure Help from another person eating meals?: None Help from another person taking care of personal grooming?: None Help from another person toileting, which includes using toliet, bedpan, or urinal?: A Little Help from another person bathing (including washing, rinsing, drying)?: A Little Help from another person to put on and taking off regular upper body clothing?: None Help from another person to put on and taking off regular lower body clothing?: A Little 6 Click Score: 21   End of Session Nurse Communication: Mobility status  Activity Tolerance: Patient tolerated treatment well Patient left: in bed;with call bell/phone within reach;with bed alarm set  OT Visit Diagnosis: Unsteadiness on feet (R26.81);Other abnormalities of gait and mobility (R26.89);Muscle weakness (generalized) (M62.81);Other symptoms and signs involving the nervous system (R29.898)  Time: 0160-1093 OT Time Calculation (min): 19 min Charges:  OT General Charges $OT Visit: 1 Visit OT Evaluation $OT Eval Low Complexity: [place], OT Acute Rehabilitation Services Pager: [phone] Office: [phone]   Hortencia Pilar 11/04/2018, 1:53 PM

## 2018-11-04 NOTE — ED Notes (Signed)
Admitting MD at bedside.

## 2018-11-04 NOTE — Procedures (Signed)
History: 73 yo F being evaluated for   Sedation: None  Technique: This is a 21 channel routine scalp EEG performed at the bedside with bipolar and monopolar montages arranged in accordance to the international 10/20 system of electrode placement. One channel was dedicated to EKG recording.    Background: There is a well defined posterior dominant rhythm of 8-9 Hz that attenuates with eye opening. In addition, there is focal irregular delta range activity maximal at T7>F7, P7 > P3. Sleep is recorded with normal appearing structures.   Photic stimulation: Physiologic driving is not performed  EEG Abnormalities: 1) Irregular focal left temporo-parietal slow activity.   Clinical Interpretation: This EEG reveals evidence of a non-specific focal cerebral disturbance in the left temporo-parietal region.   There was no seizure or seizure predisposition recorded on this study. Please note that lack of epileptiform activity on EEG does not preclude the possibility of epilepsy.   Roland Rack, MD [facility] [phone]  If 7pm- 7am, please page neurology on call as listed in [place].

## 2018-11-04 NOTE — Progress Notes (Signed)
[name] is a 73 y.o. female patient admitted from ED awake, alert - oriented  X 4 - no acute distress noted.  VSS - Blood pressure (!) 159/64, pulse 67, temperature 98 F (36.7 C), temperature source Oral, resp. rate 16, height 5\' 3"  (1.6 m), weight 70.1 kg, SpO2 100 %.    IV in place, occlusive dsg intact without redness.  Orientation to room, and floor completed with information packet given to patient/family.  Patient declined safety video at this time.  Admission INP armband ID verified with patient/family, and in place.   SR up x 2, fall assessment complete, with patient and family able to verbalize understanding of risk associated with falls, and verbalized understanding to call nsg before up out of bed.  Call light within reach, patient able to voice, and demonstrate understanding. Seizure precautions in places.    Will cont to eval and treat per MD orders.  Valorie Roosevelt, RN 11/04/2018 2:00 PM

## 2018-11-04 NOTE — H&P (Signed)
History and Physical    [name] WGY:[id number] DOB: 1946/03/21 DOA: 11/03/2018  PCP: Leanna Battles, MD  Patient coming from: Home.  Chief Complaint: Seizure.  HPI: [name] is a 73 y.o. female with history of stroke, hypertension, chronic pancreatitis admitted last year in January 2019 for ischemic bowel requiring partial colectomy was brought to the ER after patient had a seizure episode witnessed by patient's friend.  Patient had a tonic-clonic seizure episode and EMS was called.  At the time of EMS arrival as per the ER physicians report patient was postictal.  On route to the ER patient had another episode of tonic-clonic seizure which was aborted with IV Versed.  ED Course: In the ER patient remained postictal.  CT head was unremarkable.  MRI brain was negative for anything acute.  Neurologist on-call was consulted patient was loaded with 1.5 g of Keppra and admitted for further observation given the postictal phase.  At the time of my exam patient is slightly more alert awake.  Denies any headache chest pain shortness of breath.  Denies any focal deficit at this time.  Review of Systems: As per HPI, rest all negative.   Past Medical History:  Diagnosis Date  . Anxiety   . Arthropathy, unspecified, site unspecified   . Blind loop syndrome   . Carotid artery disease ([place])    documented occlusion of left ICA  . Coronary artery disease    a. known 60-70% mid-LAD stenosis with caths in 1999, 2002, 2006, and 2012 showing stable anatomy b. low-risk NST in 10/2015  . Depression   . Diabetes insipidus ([place])   . Diabetes mellitus   . Diverticulosis   . Dizziness    chronic  . Dyslipidemia   . Dyspnea    with exertion  . Fatty liver   . GERD (gastroesophageal reflux disease)    hiatial hernia  . Hepatomegaly   . Hiatal hernia   . HTN (hypertension)   . Hyperlipidemia   . Hypertension   . Irritable bowel syndrome   . Melanosis coli   . Metatarsal fracture right   . Normal nuclear stress test    nml 01/03/10;06/19/07  . Other, mixed, or unspecified nondependent drug abuse, unspecified   . Pancreatitis, chronic ([place])   . Peripheral vascular disease ([place]) 02/20/10   s/p stent of left SFA  . Sleep apnea   . Stroke ([place])   . Thyroid nodule   . Unspecified essential hypertension   . Vitamin B12 deficiency     Past Surgical History:  Procedure Laterality Date  . ABDOMINAL HYSTERECTOMY     partial  . AUGMENTATION MAMMAPLASTY     bilateral 1976 retro   . BREAST ENHANCEMENT SURGERY    . CARDIAC CATHETERIZATION  07/07/98;08/31/01;03/04/05   60 -70% LAD  . CATARACT EXTRACTION    . COLON RESECTION N/A 10/14/2017   Procedure: EXPLORATORY LAPAROTOMY, EXTENDED RIGHT COLECTOMY; APPLICATION OF ABDOMINAL VACUUM DRESSING;  Surgeon: Jovita Kussmaul, MD;  Location: [facility];  Service: General;  Laterality: N/A;  . ENDOBRONCHIAL ULTRASOUND Bilateral 02/24/2017   Procedure: ENDOBRONCHIAL ULTRASOUND;  Surgeon: Collene Gobble, MD;  Location: [facility];  Service: Cardiopulmonary;  Laterality: Bilateral;  . EYE SURGERY Left Nov. 2016   Cataract  . EYE SURGERY Right 2001   Cataract  . FEMORAL ARTERY STENT     Left leg  . LAPAROTOMY N/A 10/16/2017   Procedure: EXPLORATORY LAPAROTOMY, PARTIAL OMENTECTOMY, RESECTION ISCHEMIC ILEUM [id number], APPLICATION OF VAC ABDOMINAL  DRESSING;  Surgeon: Armandina Gemma, MD;  Location: [facility];  Service: General;  Laterality: N/A;  . LAPAROTOMY N/A 10/18/2017   Procedure: RE-EXPLORATION OF ABDOMEN, ILEOSTOMY CREATION;  Surgeon: Stark Klein, MD;  Location: [facility];  Service: General;  Laterality: N/A;  . LUNG SURGERY  03/2017   Benign polyps removed  . PATELLA REALIGNMENT Left   . TONSILLECTOMY    . TOTAL ABDOMINAL HYSTERECTOMY    . WRIST SURGERY     right     reports that she quit smoking about 8 years ago. Her smoking use included cigarettes. She has a 50.00 pack-year smoking history. She has never used smokeless tobacco. She reports that  she does not drink alcohol or use drugs.  Allergies  Allergen Reactions  . Adhesive [Tape] Other (See Comments)    "Took off my skin"  . Codeine Other (See Comments)    Altered mental state   . Iodinated Diagnostic Agents Rash    Family History  Problem Relation Age of Onset  . Hypertension Mother   . Osteoarthritis Mother   . Pulmonary embolism Mother   . Hypertension Father        aneurysm  . Stroke Father   . Stroke Other   . Colon cancer Paternal Grandfather     Prior to Admission medications   Medication Sig Start Date End Date Taking? Authorizing Provider  acetaminophen (TYLENOL) 325 MG tablet Take 650 mg by mouth every 6 (six) hours as needed for headache (pain).    [provider]  amLODipine (NORVASC) 10 MG tablet Place 1 tablet (10 mg total) into feeding tube daily. Patient taking differently: Take 10 mg by mouth daily.  11/06/17   Florencia Reasons, MD  ARIPiprazole (ABILIFY) 5 MG tablet Place 1 tablet (5 mg total) into feeding tube daily. Patient taking differently: Take 5 mg by mouth daily.  11/06/17   Florencia Reasons, MD  atorvastatin (LIPITOR) 20 MG tablet Take 20 mg by mouth daily.    [provider]  clopidogrel (PLAVIX) 75 MG tablet Take 75 mg by mouth daily.    [provider]  Dietary Management Product (VSL#3) PACK VSL# 3 900  Take two packets daily. Patient not taking: Reported on 10/11/2018 07/10/18   Willia Craze, NP  esomeprazole (NEXIUM) 40 MG capsule Take 40 mg by mouth daily. 07/29/18   [provider]  hydrOXYzine (ATARAX/VISTARIL) 25 MG tablet Take 25 mg by mouth at bedtime as needed (sleep).    [provider]  loperamide (IMODIUM) 2 MG capsule Take 2 mg by mouth 3 (three) times daily.    [provider]  losartan (COZAAR) 25 MG tablet Take 1 tablet (25 mg total) by mouth daily. Patient taking differently: Take 12.5 mg by mouth daily.  04/09/18   Orpah Greek, MD  metoprolol succinate (TOPROL-XL)  100 MG 24 hr tablet Take 100 mg by mouth daily. Take with or immediately following a meal.    [provider]  metoprolol tartrate (LOPRESSOR) 50 MG tablet Place 1 tablet (50 mg total) into feeding tube 2 (two) times daily. Patient not taking: Reported on 10/11/2018 11/06/17   Florencia Reasons, MD  mirtazapine (REMERON) 15 MG tablet Take 15 mg by mouth at bedtime.    [provider]  montelukast (SINGULAIR) 10 MG tablet Take 10 mg by mouth daily. 08/09/18   [provider]  nitroGLYCERIN (NITROSTAT) 0.4 MG SL tablet Place 1 tablet (0.4 mg total) under the tongue every 5 (five)  minutes as needed for chest pain. 11/08/15   [place], Peter M, MD  Pancrelipase, Lip-Prot-Amyl, (ZENPEP) [phone] units CPEP Take 1 capsule by mouth 3 (three) times daily with meals.    [provider]  pantoprazole (PROTONIX) 40 MG tablet Take 40 mg by mouth daily.    [provider]  Psyllium ([employer] NATURAL DAILY FIBER PO) Take 15 mLs by mouth See admin instructions. Mix one tablespoonful (15 mls) in water and drink by mouth twice daily    [provider]  triamcinolone cream (KENALOG) 0.1 % Apply 1 application topically 2 (two) times daily as needed (rash/itching).    [provider]  venlafaxine (EFFEXOR) 75 MG tablet Place 1 tablet (75 mg total) into feeding tube 2 (two) times daily with a meal. Patient not taking: Reported on 10/11/2018 11/06/17   Florencia Reasons, MD  venlafaxine XR (EFFEXOR-XR) 150 MG 24 hr capsule Take 150 mg by mouth daily.    [provider]  vitamin B-12 (CYANOCOBALAMIN) 500 MCG tablet Take 500 mcg by mouth daily.    [provider]  Vitamin D, Ergocalciferol, (DRISDOL) 1.25 MG (50000 UT) CAPS capsule Take 50,000 Units by mouth every Wednesday.     [provider]  Water For Irrigation, Sterile (FREE WATER) SOLN Place 200 mLs into feeding tube every 6 (six) hours. Patient not taking: Reported on 10/11/2018 11/06/17   Florencia Reasons, MD     Physical Exam: Vitals:   11/03/18 2300 11/03/18 2315 11/04/18 0015 11/04/18 0506  BP: (!) 122/58 (!) 159/55 (!) 161/67 135/88  Pulse: 94 (!) 106 86 88  Resp: 13 14 (!) 21 18  Temp:      TempSrc:      SpO2: 98% 97% 100% 100%      Constitutional: Moderately built and nourished. Vitals:   11/03/18 2300 11/03/18 2315 11/04/18 0015 11/04/18 0506  BP: (!) 122/58 (!) 159/55 (!) 161/67 135/88  Pulse: 94 (!) 106 86 88  Resp: 13 14 (!) 21 18  Temp:      TempSrc:      SpO2: 98% 97% 100% 100%   Eyes: Anicteric no pallor. ENMT: No discharge from the ears eyes nose or mouth. Neck: No mass felt.  No neck rigidity no JVD appreciated. Respiratory: No rhonchi or crepitations. Cardiovascular: S1-S2 heard. Abdomen: Soft nontender bowel sounds present. Musculoskeletal: No edema.  No joint effusion. Skin: No rash. Neurologic: Mildly drowsy but oriented to her name and place moves all extremities 5 x 5. Psychiatric: Oriented to name and place.   Labs on Admission: I have personally reviewed following labs and imaging studies  CBC: Recent Labs  Lab 11/03/18 2217 11/04/18 0342  WBC 14.5* 10.2  NEUTROABS 6.4  --   HGB 12.3 10.8*  HCT 41.3 32.9*  MCV 98.6 91.9  PLT 223 376   Basic Metabolic Panel: Recent Labs  Lab 11/03/18 2217 11/04/18 0342  NA 140 141  K 3.7 4.4  CL 112* 113*  CO2 9* 20*  GLUCOSE 108* 106*  BUN 14 14  CREATININE 1.88* 1.64*  CALCIUM 9.6 9.1  MG 1.7  --   PHOS 6.1*  --    GFR: CrCl cannot be calculated (Unknown ideal weight.). Liver Function Tests: Recent Labs  Lab 11/03/18 2217 11/04/18 0342  AST 40 38  ALT 31 27  ALKPHOS 90 73  BILITOT 0.4 0.7  PROT 7.0 5.7*  ALBUMIN 3.8 3.2*   No results for input(s): LIPASE, AMYLASE in the last 168 hours. No  results for input(s): AMMONIA in the last 168 hours. Coagulation Profile: Recent Labs  Lab 11/03/18 2217  INR 1.26   Cardiac Enzymes: No results for input(s): CKTOTAL, CKMB, CKMBINDEX,  TROPONINI in the last 168 hours. BNP (last 3 results) No results for input(s): PROBNP in the last 8760 hours. HbA1C: No results for input(s): HGBA1C in the last 72 hours. CBG: Recent Labs  Lab 11/03/18 2213  GLUCAP 93   Lipid Profile: No results for input(s): CHOL, HDL, LDLCALC, TRIG, CHOLHDL, LDLDIRECT in the last 72 hours. Thyroid Function Tests: No results for input(s): TSH, T4TOTAL, FREET4, T3FREE, THYROIDAB in the last 72 hours. Anemia Panel: No results for input(s): VITAMINB12, FOLATE, FERRITIN, TIBC, IRON, RETICCTPCT in the last 72 hours. Urine analysis:    Component Value Date/Time   COLORURINE STRAW (A) 11/04/2018 0118   APPEARANCEUR CLEAR 11/04/2018 0118   LABSPEC 1.004 (L) 11/04/2018 0118   PHURINE 5.0 11/04/2018 0118   GLUCOSEU NEGATIVE 11/04/2018 0118   HGBUR SMALL (A) 11/04/2018 0118   BILIRUBINUR NEGATIVE 11/04/2018 0118   KETONESUR NEGATIVE 11/04/2018 0118   PROTEINUR NEGATIVE 11/04/2018 0118   NITRITE NEGATIVE 11/04/2018 0118   LEUKOCYTESUR NEGATIVE 11/04/2018 0118   Sepsis Labs: @LABRCNTIP (procalcitonin:4,lacticidven:4) )No results found for this or any previous visit (from the past 240 hour(s)).   Radiological Exams on Admission: Mr Brain Wo Contrast  Result Date: 11/04/2018 CLINICAL DATA:  Follow up code stroke. Generalized weakness, postictal. No history of seizures. History of LEFT ICA occlusion, hypertension, hyperlipidemia and stroke. EXAM: MRI HEAD WITHOUT CONTRAST TECHNIQUE: Multiplanar, multiecho pulse sequences of the brain and surrounding structures were obtained without intravenous contrast. COMPARISON:  CT HEAD November 03, 2018 and MRI head October 24, 2017 FINDINGS: INTRACRANIAL CONTENTS: No reduced diffusion to suggest acute ischemia. No susceptibility artifact to suggest hemorrhage. The ventricles and sulci are normal for patient's age. Small area LEFT parietal encephalomalacia. Scattered subcentimeter supratentorial and to lesser extent  pontine white matter FLAIR T2 hyperintensities. No suspicious parenchymal signal, masses, mass effect. No abnormal extra-axial fluid collections. No extra-axial masses. VASCULAR: Chronic T2 bright signal LEFT internal carotid artery. SKULL AND UPPER CERVICAL SPINE: No abnormal sellar expansion. No suspicious calvarial bone marrow signal. Craniocervical junction maintained. SINUSES/ORBITS: The mastoid air-cells and included paranasal sinuses are well-aerated.The included ocular globes and orbital contents are non-suspicious. Status post bilateral ocular lens implants. OTHER: None. IMPRESSION: 1. No acute intracranial process. 2. Chronically occluded LEFT ICA. 3. 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 Electronically Signed   By: Elon Alas M.D.   On: 11/04/2018 04:48   Ct Head Code Stroke Wo Contrast  Result Date: 11/03/2018 CLINICAL DATA:  Code stroke. 73 y/o F; seizures. Evaluate for stroke. EXAM: CT HEAD WITHOUT CONTRAST TECHNIQUE: Contiguous axial images were obtained from the base of the skull through the vertex without intravenous contrast. COMPARISON:  04/09/2018 CT head. 10/24/2017 MRI head. FINDINGS: Brain: Motion artifact partially obscuring the posterior fossa and anterior temporal lobes. No acute stroke, hemorrhage, or mass effect of the brain identified. No extra-axial collection, hydrocephalus, or herniation. Small chronic might matter infarcts are present within the left parietal lobe. Vascular: Circle-of-Willis largely obscured by motion artifact. Skull: Normal. Negative for fracture or focal lesion. Sinuses/Orbits: No acute finding. Other: None. ASPECTS [facility] Stroke Program Early CT Score) - Ganglionic level infarction (caudate, lentiform nuclei, internal capsule, insula, M1-M3 cortex): 7 - Supraganglionic infarction (M4-M6 cortex): 3 Total score (0-10 with 10 being normal): 10 IMPRESSION: Motion artifact partially obscuring  posterior fossa and  anterior temporal lobes. 1. No acute intracranial abnormality identified. 2. ASPECTS is 10. 3. Small chronic might matter infarcts within left parietal lobe. These results were called by telephone at the time of interpretation on 11/03/2018 at 10:28 pm to Dr. Aletta Edouard , who verbally acknowledged these results. Electronically Signed   By: Kristine Garbe M.D.   On: 11/03/2018 22:29    EKG: Independently reviewed.  Normal sinus rhythm.  Assessment/Plan Principal Problem:   Seizure ([place]) Active Problems:   Essential hypertension   Chronic pancreatitis ([facility])   Type 2 diabetes mellitus ([facility])   Anemia   CKD (chronic kidney disease) stage 3, GFR 30-59 ml/min ([facility])    1. Seizure -new onset.  Discussed with Dr. Lorraine Lax on-call neurologist.  Patient received Keppra loading dose and neurology advised to keep patient on 500 mg IV Keppra twice daily for now check EEG.  MRI brain unremarkable. 2. Chronic renal disease stage III with mild worsening of creatinine for which we will hold off ARB.  For now no other day if there is further worsening of creatinine Keppra dose need to be adjusted. 3. Hypertension on amlodipine beta-blockers ARB on hold due to worsening renal function keep patient on PRN IV hydralazine. 4. Chronic anemia likely from renal disease follow CBC. 5. History of stroke on Plavix and statins. 6. History of chronic pancreatitis.  Denies any abdominal pain. 7. History of ischemic colitis requiring bowel resection previously.   DVT prophylaxis: Lovenox. Code Status: Full code. Family Communication: No family at the bedside. Disposition Plan: Home. Consults called: Neurology. Admission status: Observation.   Rise Patience MD [facility] Pager [phone].  If 7PM-7AM, please contact night-coverage [email or web address] Password [facility]  11/04/2018, 7:23 AM

## 2018-11-04 NOTE — Progress Notes (Signed)
PT Cancellation Note  Patient Details Name: [name] MRN: [id number] DOB: [birth year]   Cancelled Treatment:    Reason Eval/Treat Not Completed: Patient at procedure or test/unavailable. Patient is currently having EEG. Will follow up as schedule allows.  Erick Blinks, SPT  Erick Blinks 11/04/2018, 3:43 PM

## 2018-11-04 NOTE — Progress Notes (Signed)
EEG completed; results pending.    

## 2018-11-05 DIAGNOSIS — R569 Unspecified convulsions: Secondary | ICD-10-CM | POA: Diagnosis not present

## 2018-11-05 LAB — CBC
HEMATOCRIT: 32.6 % — AB (ref 36.0–46.0)
Hemoglobin: 10.4 g/dL — ABNORMAL LOW (ref 12.0–15.0)
MCH: 29.3 pg (ref 26.0–34.0)
MCHC: 31.9 g/dL (ref 30.0–36.0)
MCV: 91.8 fL (ref 80.0–100.0)
Platelets: 152 10*3/uL (ref 150–400)
RBC: 3.55 MIL/uL — ABNORMAL LOW (ref 3.87–5.11)
RDW: 13.2 % (ref 11.5–15.5)
WBC: 7.4 10*3/uL (ref 4.0–10.5)
nRBC: 0 % (ref 0.0–0.2)

## 2018-11-05 LAB — BASIC METABOLIC PANEL
Anion gap: 7 (ref 5–15)
BUN: 15 mg/dL (ref 8–23)
CO2: 19 mmol/L — ABNORMAL LOW (ref 22–32)
Calcium: 8.6 mg/dL — ABNORMAL LOW (ref 8.9–10.3)
Chloride: 114 mmol/L — ABNORMAL HIGH (ref 98–111)
Creatinine, Ser: 1.69 mg/dL — ABNORMAL HIGH (ref 0.44–1.00)
GFR calc Af Amer: 35 mL/min — ABNORMAL LOW (ref >60)
GFR calc non Af Amer: 30 mL/min — ABNORMAL LOW (ref >60)
Glucose, Bld: 101 mg/dL — ABNORMAL HIGH (ref 70–99)
Potassium: 3.7 mmol/L (ref 3.5–5.1)
Sodium: 140 mmol/L (ref 135–145)

## 2018-11-05 MED ORDER — LEVETIRACETAM 500 MG PO TABS: 500.0000 mg | ORAL_TABLET | Freq: Two times a day (BID) | ORAL | 1 refills | Status: DC

## 2018-11-05 MED ORDER — LEVETIRACETAM 500 MG PO TABS: 500.0000 mg | ORAL_TABLET | Freq: Two times a day (BID) | ORAL | Status: DC

## 2018-11-05 NOTE — Progress Notes (Signed)
Occupational Therapy Treatment Patient Details Name: [name] MRN: [id number] DOB: 04-04-1946 Today's Date: 11/05/2018    History of present illness  73 y.o. female with history of stroke, hypertension, chronic pancreatitis admitted last year in January 2019 for ischemic bowel requiring partial colectomy was brought to the ER after patient had a seizure episode    OT comments  Pt making progress with functional goals. Pt continues to require min guard A with mobility and assist with LB ADLs/standing tasks due to decreased balance. OT will continue to follow acutely  Follow Up Recommendations  SNF;Supervision/Assistance - 24 hour    Equipment Recommendations  3 in 1 bedside commode    Recommendations for Other Services      Precautions / Restrictions Precautions Precautions: Fall;Other (comment) Precaution Comments: seizures Restrictions Weight Bearing Restrictions: No       Mobility Bed Mobility Overal bed mobility: Modified Independent                Transfers Overall transfer level: Needs assistance Equipment used: None Transfers: Sit to/from Stand Sit to Stand: Min guard         General transfer comment: unsteady    Balance Overall balance assessment: Needs assistance Sitting-balance support: No upper extremity supported;Feet supported Sitting balance-Leahy Scale: Good     Standing balance support: Single extremity supported;Bilateral upper extremity supported;During functional activity Standing balance-Leahy Scale: Poor                             ADL either performed or assessed with clinical judgement   ADL Overall ADL's : Needs assistance/impaired     Grooming: Min guard;Standing       Lower Body Bathing: Min guard;Minimal assistance;Sit to/from stand Lower Body Bathing Details (indicate cue type and reason): simulated     Lower Body Dressing: Min guard;Minimal assistance;Sit to/from stand       Toileting- Marine scientist and Hygiene: Min guard;Sit to/from stand   Tub/ Shower Transfer: Minimal assistance;Ambulation;Grab bars;3 in 1 Tub/Shower Transfer Details (indicate cue type and reason): simulated Functional mobility during ADLs: Min guard;Cueing for safety General ADL Comments: Pt ambulated in the room at min guard level, unsteadiness noted but no overt LOB. nsfers and LB ADLs.     Vision Patient Visual Report: No change from baseline     Perception     Praxis      Cognition Arousal/Alertness: Awake/alert Behavior During Therapy: Flat affect Overall Cognitive Status: No family/caregiver present to determine baseline cognitive functioning                                          Exercises     Shoulder Instructions       General Comments      Pertinent Vitals/ Pain       Pain Assessment: No/denies pain  Home Living Family/patient expects to be discharged to:: Private residence Living Arrangements: Alone Available Help at Discharge: Family;Available PRN/intermittently Type of Home: House Home Access: Level entry     Home Layout: One level     Bathroom Shower/Tub: Teacher, early years/pre: Standard     Home Equipment: Walker - 2 wheels;Grab bars - toilet;Grab bars - tub/shower;Shower seat   Additional Comments: per pt report, no family present       Prior Functioning/Environment Level of Independence: Independent  Comments: pt stated she drives, denies h/o falls in past 1 year   Frequency           Progress Toward Goals  OT Goals(current goals can now be found in the care plan section)  Progress towards OT goals: Progressing toward goals     Plan Discharge plan remains appropriate    Co-evaluation                 AM-PAC OT "6 Clicks" Daily Activity     Outcome Measure   Help from another person eating meals?: None Help from another person taking care of personal grooming?: None Help from another  person toileting, which includes using toliet, bedpan, or urinal?: A Little Help from another person bathing (including washing, rinsing, drying)?: A Little Help from another person to put on and taking off regular upper body clothing?: None Help from another person to put on and taking off regular lower body clothing?: A Little 6 Click Score: 21    End of Session Equipment Utilized During Treatment: Gait belt  OT Visit Diagnosis: Unsteadiness on feet (R26.81);Other abnormalities of gait and mobility (R26.89);Muscle weakness (generalized) (M62.81);Other symptoms and signs involving the nervous system (R29.898)   Activity Tolerance Patient tolerated treatment well   Patient Left in bed;with call bell/phone within reach;with bed alarm set   Nurse Communication          Time: 6295-2841 OT Time Calculation (min): 24 min  Charges: OT General Charges $OT Visit: 1 Visit OT Treatments $Self Care/Home Management : 8-22 mins $Therapeutic Activity: 8-22 mins     Britt Bottom 11/05/2018, 1:13 PM

## 2018-11-05 NOTE — Progress Notes (Signed)
PT Cancellation Note  Patient Details Name: [name] MRN: [id number] DOB: 1946/07/30   Cancelled Treatment:    Reason Eval/Treat Not Completed: Other (comment)(pt soiled 2* large leak from ostomy, NT and RN notified. Will check back after this issue is addressed. )  Philomena Doheny PT 11/05/2018  Acute Rehabilitation Services Pager [phone] Office [phone]

## 2018-11-05 NOTE — Discharge Summary (Signed)
Please see progress note from 1/23 as this is a discharge summary JV

## 2018-11-05 NOTE — Evaluation (Addendum)
Physical Therapy Evaluation Patient Details Name: [name] MRN: [id number] DOB: 11/24/1945 Today's Date: 11/05/2018   History of Present Illness   73 y.o. female with history of stroke, hypertension, chronic pancreatitis admitted last year in January 2019 for ischemic bowel requiring partial colectomy was brought to the ER after patient had a seizure episode   Clinical Impression  Pt admitted with above diagnosis. Pt currently with functional limitations due to the deficits listed below (see PT Problem List). Pt ambulated 75', initially without assistance, she had loss of balance x 1 requiring min assist to recover, then walking with hand held assist of 1. She reports chronic L knee pain 2* OA, for which she receives "shots in the knee". Supervision and use of RW for mobility recommended. Pt stated she can arrange to have some help at home. Pt will benefit from skilled PT to increase their independence and safety with mobility to allow discharge to the venue listed below.       Follow Up Recommendations Home health PT; supervision for mobility    Equipment Recommendations  Cane    Recommendations for Other Services       Precautions / Restrictions Precautions Precautions: Fall;Other (comment) Precaution Comments: seizures, colostomy Restrictions Weight Bearing Restrictions: No      Mobility  Bed Mobility Overal bed mobility: Modified Independent             General bed mobility comments: up in recliner  Transfers Overall transfer level: Needs assistance Equipment used: Rolling walker (2 wheeled) Transfers: Sit to/from Stand Sit to Stand: Min guard         General transfer comment: unsteady  Ambulation/Gait Ambulation/Gait assistance: Min guard;Min assist Gait Distance (Feet): 75 Feet Assistive device: 1 person hand held assist;None Gait Pattern/deviations: Decreased stride length Gait velocity: WFL   General Gait Details: pt had 1 episode of loss of  balance (while walking without assistance) requiring min assist, then walked with hand held assist of 1  Stairs            Wheelchair Mobility    Modified Rankin (Stroke Patients Only)       Balance Overall balance assessment: Needs assistance Sitting-balance support: No upper extremity supported;Feet supported Sitting balance-Leahy Scale: Good     Standing balance support: Single extremity supported;Bilateral upper extremity supported;During functional activity Standing balance-Leahy Scale: Fair                               Pertinent Vitals/Pain Pain Assessment: No/denies pain    Home Living Family/patient expects to be discharged to:: Private residence Living Arrangements: Alone Available Help at Discharge: Family;Available PRN/intermittently Type of Home: House Home Access: Level entry     Home Layout: One level Home Equipment: Walker - 2 wheels;Grab bars - toilet;Grab bars - tub/shower;Shower seat Additional Comments: per pt report, no family present     Prior Function Level of Independence: Independent         Comments: pt stated she drives, denies h/o falls in past 1 year, walks without AD     Hand Dominance        Extremity/Trunk Assessment   Upper Extremity Assessment Upper Extremity Assessment: Overall WFL for tasks assessed    Lower Extremity Assessment Lower Extremity Assessment: LLE deficits/detail LLE Deficits / Details: pain with resisted knee extension, pt reports h/o OA and that she "gets shots" in her knee LLE Sensation: WNL  Communication   Communication: Expressive difficulties(slow, mildly slurred speech)  Cognition Arousal/Alertness: Awake/alert Behavior During Therapy: Flat affect Overall Cognitive Status: No family/caregiver present to determine baseline cognitive functioning                                 General Comments: A&O person, place, able to state year, recalls basics of what  brought her here. Some slow processing noted. Very flat affect. No family present to compare to baseline      General Comments      Exercises     Assessment/Plan    PT Assessment Patient needs continued PT services  PT Problem List Decreased activity tolerance;Decreased mobility;Decreased balance       PT Treatment Interventions DME instruction;Gait training;Functional mobility training;Therapeutic activities;Therapeutic exercise;Balance training    PT Goals (Current goals can be found in the Care Plan section)  Acute Rehab PT Goals Patient Stated Goal: discussed SNF for rehab, pt states she plans to d/c home PT Goal Formulation: With patient Time For Goal Achievement: 11/19/18 Potential to Achieve Goals: Good    Frequency Min 3X/week   Barriers to discharge        Co-evaluation               AM-PAC PT "6 Clicks" Mobility  Outcome Measure Help needed turning from your back to your side while in a flat bed without using bedrails?: None Help needed moving from lying on your back to sitting on the side of a flat bed without using bedrails?: A Little Help needed moving to and from a bed to a chair (including a wheelchair)?: A Little Help needed standing up from a chair using your arms (e.g., wheelchair or bedside chair)?: A Little Help needed to walk in hospital room?: A Little Help needed climbing 3-5 steps with a railing? : A Little 6 Click Score: 19    End of Session Equipment Utilized During Treatment: Gait belt Activity Tolerance: Patient tolerated treatment well Patient left: in chair;with call bell/phone within reach;with chair alarm set Nurse Communication: Mobility status PT Visit Diagnosis: Unsteadiness on feet (R26.81);Difficulty in walking, not elsewhere classified (R26.2)    Time: 9702-6378 PT Time Calculation (min) (ACUTE ONLY): 17 min   Charges:   PT Evaluation $PT Eval Low Complexity: 1 Low         Blondell Reveal Kistler PT 11/05/2018   Acute Rehabilitation Services Pager [phone] Office [phone]

## 2018-11-05 NOTE — Progress Notes (Addendum)
Physician Discharge Summary  [name] QIH:[id number] DOB: June 24, 1946 DOA: 11/03/2018  PCP: Leanna Battles, MD  Admit date: 11/03/2018 Discharge date: 11/05/2018  Time spent: 45 minutes  Recommendations for Outpatient Follow-up:  1. Neurology to contact patient for appointment 6 weeks 2. HH PT, RN, SW, Aid for skilled assessment and strength/balance exercises 3. Has been instructed no driving, no ladders, showers vs bath, avoid etoh 4. 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  Ileostomy 10/18/2017   Anemia   CKD (chronic kidney disease) stage 3, GFR 30-59 ml/min [facility])   Discharge Condition: stable  Diet recommendation: heart healthy  Filed Weights   11/04/18 1301  Weight: 70.1 kg    History of present illness:  73 year old female with recent stroke whopresentedto the ED on 1/21/20with 2 generalized tonic clonic seizures.She had a normal sodium and noevidence of infection.She wasback to baseline at the time of admission. In last 12 months she underwent extended right colectomy due to ischemic colitis with ileostomy. Hx htn, and tobacco use.  Hospital Course:  1. Seizure-new onset. MRI brain unremarkable.  Evaluated by Dr. Lorraine Lax who recommended keppra and EEG. EEG with irregular focal left temporo-parietal slow activity per neuro read who opines most likely secondary to the chronic left parietal lobe infarct seen on MRI. Patient provided with  [place] DMV statues regarding seizures. Neurology to contact for followup. 2. Chronic renal disease stage IV with mild worsening of creatinine. Provided with  IV fluids. Creatinine close to baseline at discharge 3. Hypertension  on amlodipine beta-blockers ARB held while in hospital due to worsening renal function. Resume at discharge. Follow up with PCP 2-3 weeks evaluation kidney function 4. Chronic anemia likely from renal disease. Hg 10.8 a little below baseline. may be dilutional. No s/sx of bleeding. Follow up with PCP  5. History of stroke on Plavix and statins. 6. History of chronic pancreatitis. Denied any abdominal pain. History of ischemic colitis requiring bowel resection previously with ileostomy.  Procedures:  EEG  Consultations:  [place] neurology  Discharge Exam: Vitals:   11/04/18 2120 11/05/18 0502  BP: 130/67 (!) 156/82  Pulse: 83 89  Resp: 18 18  Temp: 98.3 F (36.8 C) 97.7 F (36.5 C)  SpO2: 95% 98%    General: well nourished in no acute distress Cardiovascular: rrr no mgr no LE edema Respiratory: normal effort BS clear bilaterally   Discharge Instructions   Discharge Instructions    Ambulatory referral to Neurology   Complete by:  As directed    An appointment is requested in approximately 6 weeks. Recent stroke and now new seizure   Diet - low sodium heart healthy   Complete by:  As directed    Discharge instructions   Complete by:  As directed    Continue Keppra 500 mg BID 2. Per [facility] statutes, patients with seizures are not allowed to drive until  they have been seizure-free for six months. Use caution when using heavy equipment or power tools. Avoid working on ladders or at heights. Take showers instead of baths. Ensure the water temperature is not too high on the home water heater. Do not go swimming alone. When caring for  infants or small children, sit down when holding, feeding, or changing them to minimize risk of injury to the child in the event you have a seizure. Also, Maintain good sleep hygiene. Avoid alcohol. 3. Outpatient Neurology follow up.   Increase activity slowly   Complete by:  As directed       Allergies  Allergen Reactions  .  Adhesive [Tape] Other (See Comments)    "Took off my skin"  . Codeine Other (See Comments)    Altered mental state   . Iodinated Diagnostic Agents Rash      The results of significant diagnostics from this hospitalization (including imaging, microbiology, ancillary and laboratory) are listed below for reference.    Significant Diagnostic Studies: Mr Brain Wo Contrast  Result Date: 11/04/2018 CLINICAL DATA:  Follow up code stroke. Generalized weakness, postictal. No history of seizures. History of LEFT ICA occlusion, hypertension, hyperlipidemia and stroke. EXAM: MRI HEAD WITHOUT CONTRAST TECHNIQUE: Multiplanar, multiecho pulse sequences of the brain and surrounding structures were obtained without intravenous contrast. COMPARISON:  CT HEAD November 03, 2018 and MRI head October 24, 2017 FINDINGS: INTRACRANIAL CONTENTS: No reduced diffusion to suggest acute ischemia. No susceptibility artifact to suggest hemorrhage. The ventricles and sulci are normal for patient's age. Small area LEFT parietal encephalomalacia. Scattered subcentimeter supratentorial and to lesser extent pontine white matter FLAIR T2 hyperintensities. No suspicious parenchymal signal, masses, mass effect. No abnormal extra-axial fluid collections. No extra-axial masses. VASCULAR: Chronic T2 bright signal LEFT internal carotid artery. SKULL AND UPPER CERVICAL SPINE: No abnormal sellar expansion. No suspicious calvarial bone marrow signal. Craniocervical junction maintained. SINUSES/ORBITS: The mastoid air-cells and included paranasal sinuses are well-aerated.The included ocular globes and orbital contents are non-suspicious. Status post bilateral ocular lens implants. OTHER: None. IMPRESSION: 1. No acute intracranial process. 2. Chronically occluded LEFT ICA. 3. 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 Electronically Signed   By: Elon Alas M.D.   On: 11/04/2018 04:48   Ct Head Code Stroke  Wo Contrast  Result Date: 11/03/2018 CLINICAL DATA:  Code stroke. 72 y/o F; seizures. Evaluate for stroke. EXAM: CT HEAD WITHOUT CONTRAST TECHNIQUE: Contiguous axial images were obtained from the base of the skull through the vertex without intravenous contrast. COMPARISON:  04/09/2018 CT head. 10/24/2017 MRI head. FINDINGS: Brain: Motion artifact partially obscuring the posterior fossa and anterior temporal lobes. No acute stroke, hemorrhage, or mass effect of the brain identified. No extra-axial collection, hydrocephalus, or herniation. Small chronic might matter infarcts are present within the left parietal lobe. Vascular: Circle-of-Willis largely obscured by motion artifact. Skull: Normal. Negative for fracture or focal lesion. Sinuses/Orbits: No acute finding. Other: None. ASPECTS ([place] Stroke Program Early CT Score) - Ganglionic level infarction (caudate, lentiform nuclei, internal capsule, insula, M1-M3 cortex): 7 - Supraganglionic infarction (M4-M6 cortex): 3 Total score (0-10 with 10 being normal): 10 IMPRESSION: Motion artifact partially obscuring posterior fossa and anterior temporal lobes. 1. No acute intracranial abnormality identified. 2. ASPECTS is 10. 3. Small chronic might matter infarcts within left parietal lobe. These results were called by telephone at the time of interpretation on 11/03/2018 at 10:28 pm to Dr. Aletta Edouard , who verbally acknowledged these results. Electronically Signed   By: Kristine Garbe M.D.   On: 11/03/2018 22:29    Microbiology: No results found for this or any previous visit (from the past 240 hour(s)).   Labs: Basic Metabolic Panel: Recent Labs  Lab 11/03/18 2217 11/04/18 0342 11/05/18 0440  NA 140  141 140  K 3.7 4.4 3.7  CL 112* 113* 114*  CO2 9* 20* 19*  GLUCOSE 108* 106* 101*  BUN 14 14 15   CREATININE 1.88* 1.64* 1.69*  CALCIUM 9.6 9.1 8.6*  MG 1.7  --   --   PHOS 6.1*  --   --    Liver Function Tests: Recent Labs  Lab  11/03/18 2217 11/04/18 0342  AST 40 38  ALT 31 27  ALKPHOS 90 73  BILITOT 0.4 0.7  PROT 7.0 5.7*  ALBUMIN 3.8 3.2*   No results for input(s): LIPASE, AMYLASE in the last 168 hours. No results for input(s): AMMONIA in the last 168 hours. CBC: Recent Labs  Lab 11/03/18 2217 11/04/18 0342 11/05/18 0440  WBC 14.5* 10.2 7.4  NEUTROABS 6.4  --   --   HGB 12.3 10.8* 10.4*  HCT 41.3 32.9* 32.6*  MCV 98.6 91.9 91.8  PLT 223 173 152   Cardiac Enzymes: No results for input(s): CKTOTAL, CKMB, CKMBINDEX, TROPONINI in the last 168 hours. BNP: BNP (last 3 results) No results for input(s): BNP in the last 8760 hours.  ProBNP (last 3 results) No results for input(s): PROBNP in the last 8760 hours.  CBG: Recent Labs  Lab 11/03/18 2213  GLUCAP 93       Signed:  Radene Gunning NP  [facility] 11/05/2018, 1:55 PM

## 2018-11-11 DIAGNOSIS — Z8673 Personal history of transient ischemic attack (TIA), and cerebral infarction without residual deficits: Secondary | ICD-10-CM | POA: Insufficient documentation

## 2018-12-03 ENCOUNTER — Telehealth: Payer: Self-pay

## 2018-12-03 ENCOUNTER — Encounter: Payer: Self-pay | Admitting: Nurse Practitioner

## 2018-12-03 ENCOUNTER — Ambulatory Visit (INDEPENDENT_AMBULATORY_CARE_PROVIDER_SITE_OTHER): Payer: 59 | Admitting: Nurse Practitioner

## 2018-12-03 ENCOUNTER — Encounter (INDEPENDENT_AMBULATORY_CARE_PROVIDER_SITE_OTHER): Payer: Self-pay

## 2018-12-03 VITALS — BP 140/76 | HR 60 | Ht 63.0 in | Wt 156.0 lb

## 2018-12-03 DIAGNOSIS — K559 Vascular disorder of intestine, unspecified: Secondary | ICD-10-CM | POA: Diagnosis not present

## 2018-12-03 MED ORDER — DIPHENOXYLATE-ATROPINE 2.5-0.025 MG PO TABS: 1.0000 | ORAL_TABLET | Freq: Two times a day (BID) | ORAL | 2 refills | Status: DC

## 2018-12-03 NOTE — Telephone Encounter (Signed)
   Primary Cardiologist:Peter Martinique, MD  Chart reviewed as part of pre-operative protocol coverage. Because of [name]'s past medical history and time since last visit, he/she will require a follow-up visit in order to better assess preoperative cardiovascular risk.  Pre-op covering staff: - Please schedule appointment and call patient to inform them. - Please contact requesting surgeon's office via preferred method (i.e, phone, fax) to inform them of need for appointment prior to surgery.  If applicable, this message will also be routed to pharmacy pool and/or primary cardiologist for input on holding anticoagulant/antiplatelet agent as requested below so that this information is available at time of patient's appointment.   Brittainy Simmons, PA-C  12/03/2018, 11:09 AM

## 2018-12-03 NOTE — Telephone Encounter (Signed)
Pt has been scheduled to see Almyra Deforest, PA-C, 12/08/2018.

## 2018-12-03 NOTE — Progress Notes (Signed)
Chief Complaint:    Needs colonoscopy  IMPRESSION and PLAN:    89.  73 yo female with hx of ischemic bowel. She is s/p extended right hemicolectomy followed a couple of days later by partial omentectomy + small bowel resection. She has an end ileostomy and hoping for takedown soon. Surgery requesting pre-op colonoscopy to evaluate remaining colon.  -Will schedule patient for colonoscopy. PO bowel prep will not work since she has an end Ileostomy. Needs two fleets enemas (per rectum) the morning of the procedure.  Will schedule procedure out a few weeks given #2.  -Recommend patient measure daily ostomy output. Surgery started her on fiber and imodium. Not taking imodium, doesn't like the taste. Patient is taking something at home TID for diarrhea but she doesn't know the name. I asked her to call us with name of what she is taking. For now will try her on Lomotil.  -Advised to hold all anti-diarrheals prior to prepping for colonoscopy  2. Seizures, new on 11/03/18. Seizure felt likely to be secondary to chronic left parietal lobe infarct.   No seizures in a month on Keppra.  3. Chronic pancreatitis, on replacement enzymes.  HPI:     Patient is a 73 year old female with a history of multiple medical problems not limited to DM2, HTN, CAD, COPD, pancreatic insufficiency, seizures, GERD, fatty liver disease, and diverticulosis . In January 2019 she underwent exploratory laparotomy with extended right colectomy for ischemic bowel.  She was taken back to the OR 2 days later for removal of the wound VAC.  At that time her ileum and omentum along the proximal transverse colon showed signs of infarction.  A partial omentectomy and resection of ischemic ileum (130 cm) was performed.  A few days later she was taken back to the OR and underwent an end proximal ileostomy.   Patient here now at request of Surgery. She is interested in ostomy takedown and Surgery requesting pre-op colonoscopy to  evaluate remaining colon.  There is concern about ostomy output, surgery has recommended fiber and Imodium.  Patient does not like the taste of Imodium so she takes something else but does not know the name of the medication.  What ever the medication is it does help thicken her stool but does not necessarily result in less volume.  Patient does not know exactly what her daily output is.  She empties her ostomy bag every time she urinates and cannot really tell me how much stool is generally in the bag at the time nor about how many times she urinates a day.  No rectal discharge . No weight loss, she reports weight gain.    Review of systems:     No chest pain, no SOB, no fevers, no urinary sx   Past Medical History:  Diagnosis Date  . Anxiety   . Arthropathy, unspecified, site unspecified   . Blind loop syndrome   . Carotid artery disease ([place])    documented occlusion of left ICA  . Coronary artery disease    a. known 60-70% mid-LAD stenosis with caths in 1999, 2002, 2006, and 2012 showing stable anatomy b. low-risk NST in 10/2015  . Depression   . Diabetes insipidus ([place])   . Diabetes mellitus   . Diverticulosis   . Dizziness    chronic  . Dyslipidemia   . Dyspnea    with exertion  . Fatty liver   . GERD (gastroesophageal reflux disease)    hiatial  hernia  . Hepatomegaly   . Hiatal hernia   . HTN (hypertension)   . Hyperlipidemia   . Hypertension   . Irritable bowel syndrome   . Melanosis coli   . Metatarsal fracture right  . Normal nuclear stress test    nml 01/03/10;06/19/07  . Other, mixed, or unspecified nondependent drug abuse, unspecified   . Pancreatitis, chronic ([place])   . Peripheral vascular disease ([place]) 02/20/10   s/p stent of left SFA  . Seizure ([place]) 11/04/2018  . Sleep apnea   . Stroke ([place])   . Thyroid nodule   . Unspecified essential hypertension   . Vitamin B12 deficiency     Patient's surgical history, family medical history, social history, medications  and allergies were all reviewed in Epic   Creatinine clearance cannot be calculated (Patient's most recent lab result is older than the maximum 21 days allowed.)  Current Outpatient Medications  Medication Sig Dispense Refill  . acetaminophen (TYLENOL) 325 MG tablet Take 650 mg by mouth every 6 (six) hours as needed for headache (pain).    Marland Kitchen amLODipine (NORVASC) 10 MG tablet Place 1 tablet (10 mg total) into feeding tube daily. (Patient taking differently: Take 10 mg by mouth daily. ) 30 tablet 0  . ARIPiprazole (ABILIFY) 5 MG tablet Place 1 tablet (5 mg total) into feeding tube daily. (Patient taking differently: Take 5 mg by mouth daily. ) 30 tablet 0  . atorvastatin (LIPITOR) 20 MG tablet Take 20 mg by mouth daily.    . clopidogrel (PLAVIX) 75 MG tablet Take 75 mg by mouth daily.    Marland Kitchen esomeprazole (NEXIUM) 40 MG capsule Take 40 mg by mouth daily.    . hydrOXYzine (ATARAX/VISTARIL) 25 MG tablet Take 25 mg by mouth at bedtime as needed (sleep).    Marland Kitchen levETIRAcetam (KEPPRA) 500 MG tablet Take 1 tablet (500 mg total) by mouth 2 (two) times daily. 60 tablet 1  . loperamide (IMODIUM) 2 MG capsule Take 2 mg by mouth 3 (three) times daily.    Marland Kitchen losartan (COZAAR) 25 MG tablet Take 1 tablet (25 mg total) by mouth daily. (Patient taking differently: Take 12.5 mg by mouth daily. ) 30 tablet 0  . metoprolol succinate (TOPROL-XL) 100 MG 24 hr tablet Take 100 mg by mouth daily. Take with or immediately following a meal.    . mirtazapine (REMERON) 15 MG tablet Take 15 mg by mouth at bedtime.    . montelukast (SINGULAIR) 10 MG tablet Take 10 mg by mouth daily.    . nitroGLYCERIN (NITROSTAT) 0.4 MG SL tablet Place 1 tablet (0.4 mg total) under the tongue every 5 (five) minutes as needed for chest pain. 90 tablet 3  . Pancrelipase, Lip-Prot-Amyl, (ZENPEP) 40000-126000 units CPEP Take 1 capsule by mouth 3 (three) times daily with meals.    . pantoprazole (PROTONIX) 40 MG tablet Take 40 mg by mouth daily.    .  Psyllium ([employer] NATURAL DAILY FIBER PO) Take 15 mLs by mouth See admin instructions. Mix one tablespoonful (15 mls) in water and drink by mouth twice daily    . triamcinolone cream (KENALOG) 0.1 % Apply 1 application topically 2 (two) times daily as needed (rash/itching).    . venlafaxine XR (EFFEXOR-XR) 150 MG 24 hr capsule Take 150 mg by mouth daily.    . vitamin B-12 (CYANOCOBALAMIN) 500 MCG tablet Take 500 mcg by mouth daily.    . Vitamin D, Ergocalciferol, (DRISDOL) 1.25 MG (50000 UT) CAPS capsule Take 50,000 Units  by mouth every Wednesday.      No current facility-administered medications for this visit.     Physical Exam:     BP 140/76   Pulse 60   Ht 5\' 3"  (1.6 m)   Wt 156 lb (70.8 kg)   BMI 27.63 kg/m   GENERAL:  Pleasant female in NAD PSYCH: : Cooperative, normal affect EENT:  conjunctiva pink, mucous membranes moist, neck supple without masses CARDIAC:  RRR,  no peripheral edema PULM: Normal respiratory effort, lungs CTA bilaterally, no wheezing ABDOMEN:  Nondistended, soft, nontender. No obvious masses, no hepatomegaly,  normal bowel sounds. Ostomy present SKIN:  turgor, no lesions seen Musculoskeletal:  Normal muscle tone, normal strength NEURO: Alert and oriented x 3, no focal neurologic deficits   Tye Savoy , NP 12/03/2018, 10:26 AM

## 2018-12-03 NOTE — Telephone Encounter (Signed)
[facility] Pre-operative Risk Assessment     Request for surgical clearance:     Endoscopy Procedure  What type of surgery is being performed?     Colonoscopy  When is this surgery scheduled?     12/30/18  What type of clearance is required ?   Pharmacy  Are there any medications that need to be held prior to surgery and how long? HOLD PLAVIX FIVE DAYS PRIOR  Practice name and name of physician performing surgery?      [facility]  What is your office phone and fax number?      Phone- [phone]  [phone]  Anesthesia type (None, local, MAC, general) ?       MAC

## 2018-12-03 NOTE — Telephone Encounter (Signed)
If patient is otherwise clear she can hold Plavix 5 days for surgery.  Cosette Prindle Martinique MD, [facility]

## 2018-12-03 NOTE — Patient Instructions (Addendum)
If you are age 72 or older, your body mass index should be between 23-30. Your Body mass index is 27.63 kg/m. If this is out of the aforementioned range listed, please consider follow up with your Primary Care Provider.  If you are age 68 or younger, your body mass index should be between 19-25. Your Body mass index is 27.63 kg/m. If this is out of the aformentioned range listed, please consider follow up with your Primary Care Provider.   You have been scheduled for a colonoscopy. Please follow written instructions given to you at your visit today.  Please pick up your prep supplies at the pharmacy within the next 1-3 days. If you use inhalers (even only as needed), please bring them with you on the day of your procedure. Your physician has requested that you go to www.startemmi.com and enter the access code given to you at your visit today. This web site gives a general overview about your procedure. However, you should still follow specific instructions given to you by our office regarding your preparation for the procedure.  Prep instruction - the day before procedure at 8 pm take 2 Dulcolax tablets. The morning of procedure use 2 Fleets enemas back to back at 9:30 am.  We have sent the following medications to your pharmacy for you to pick up at your convenience: Lomotil  You will be contacted by our office prior to your procedure for directions on holding your Plavix.  If you do not hear from our office 1 week prior to your scheduled procedure, please call [phone] to discuss.   Thank you for choosing me and [facility].   Tye Savoy, NP

## 2018-12-04 DIAGNOSIS — J209 Acute bronchitis, unspecified: Secondary | ICD-10-CM | POA: Insufficient documentation

## 2018-12-04 DIAGNOSIS — R1311 Dysphagia, oral phase: Secondary | ICD-10-CM | POA: Insufficient documentation

## 2018-12-07 ENCOUNTER — Encounter: Payer: Self-pay | Admitting: Nurse Practitioner

## 2018-12-07 NOTE — Progress Notes (Signed)
Reviewed and agree with management plan.  Mikaelah Trostle T. Harnoor Reta, MD FACG 

## 2018-12-08 ENCOUNTER — Ambulatory Visit (INDEPENDENT_AMBULATORY_CARE_PROVIDER_SITE_OTHER): Payer: 59 | Admitting: Physician Assistant

## 2018-12-08 ENCOUNTER — Encounter: Payer: Self-pay | Admitting: Physician Assistant

## 2018-12-08 VITALS — BP 144/80 | HR 57 | Ht 63.0 in | Wt 155.8 lb

## 2018-12-08 DIAGNOSIS — Z79899 Other long term (current) drug therapy: Secondary | ICD-10-CM | POA: Diagnosis not present

## 2018-12-08 DIAGNOSIS — I10 Essential (primary) hypertension: Secondary | ICD-10-CM

## 2018-12-08 DIAGNOSIS — I2510 Atherosclerotic heart disease of native coronary artery without angina pectoris: Secondary | ICD-10-CM

## 2018-12-08 DIAGNOSIS — Z01818 Encounter for other preprocedural examination: Secondary | ICD-10-CM

## 2018-12-08 DIAGNOSIS — I6523 Occlusion and stenosis of bilateral carotid arteries: Secondary | ICD-10-CM

## 2018-12-08 MED ORDER — LOSARTAN POTASSIUM 50 MG PO TABS: 50.0000 mg | ORAL_TABLET | Freq: Every day | ORAL | 3 refills | Status: DC

## 2018-12-08 NOTE — Patient Instructions (Addendum)
Medication Instructions:   INCREASE LOSARTAN TO 50 MG DAILY  If you need a refill on your cardiac medications before your next appointment, please call your pharmacy.   Lab work: You will need to return to the office in 2 weeks for labs to be drawn. Those labs are: BMET  If you have labs (blood work) drawn today and your tests are completely normal, you will receive your results only by: Marland Kitchen MyChart Message (if you have MyChart) OR . A paper copy in the mail If you have any lab test that is abnormal or we need to change your treatment, we will call you to review the results.  Testing/Procedures: Your physician has requested that you have a Lexiscan myoview. For further information please visit [email or web address]. Please follow instruction sheet, as given.  To be scheduled within a month.   This is done at [facility] [address]  Follow-Up: At [facility], you and your health needs are our priority.  As part of our continuing mission to provide you with exceptional heart care, we have created designated Provider Care Teams.  These Care Teams include your primary Cardiologist (physician) and Advanced Practice Providers (APPs -  Physician Assistants and Nurse Practitioners) who all work together to provide you with the care you need, when you need it. You will need a follow up appointment in 3 months.  Please call our office 2 months in advance to schedule this appointment.  You may see Peter Martinique, MD or one of the following Advanced Practice Providers on your designated Care Team: [place], [place] . Fabian Sharp, PA-C  Any Other Special Instructions Will Be Listed Below (If Applicable).  HOLD Plavix for 5 days prior to current surgery and any other surgery you may have

## 2018-12-08 NOTE — Progress Notes (Signed)
Cardiology Office Note    Date:  12/09/2018   ID:  [name], [name] 02/22/1946, MRN [id number]  PCP:  Leanna Battles, MD  Cardiologist:  Dr. Martinique  Chief Complaint  Patient presents with  . Follow-up    seen for Dr. Martinique.     History of Present Illness:  [name] is a 73 y.o. female with PMH of carotid artery disease with known occlusion of left ICA, CAD, DM 2, diabetes insipidus, dizziness, HTN, GERD, chronic pancreatitis, CVA, PAD with stent to the left SFA, and obstructive sleep apnea. She has known history of coronary artery disease with 60-70% stenosis in mid LAD. Cardiac catheterization in 1999, 2002, 2006, 2012 showed stable anatomy. Stress myoview in 12/2009 was normal. She had a history of chronic tobacco abuse. Her carotid artery disease is being followed yearly by [facility]. Last Myoview obtained on 11/03/2015 was normal, low risk study, no significant ischemia. Her lower extremity arterial Doppler obtained in February 2018 showed normal flow on the right, mild occlusive disease on the left. Carotid Doppler obtained on 11/26/2016 showed > 50% left mid common carotid artery stenosis, known left internal carotid occlusion, right internal carotid artery suggestive 40-59% stenosis.   I last saw the patient in July 2018, at which time she was doing well.  She was on both atenolol and Toprol-XL at the time however she would remain tachycardic despite so.  I increased her Imdur and I plan to bring her 3 weeks later to adjust her blood pressure medication.  She failed to show up since.  She underwent ex lap in January 2019 for ischemic colitis.  She underwent partial colostomy.  More recently, patient was admitted with a episode of tonic-clonic seizure that was treated with IV Versed.  MRI was negative for acute issue.  There was no sign of infection and she had normal sodium level.  EEG showed irregular focal left temporoparietal slow activity per neuro read most likely secondary to  chronic left parietal lobe infarct seen on the MRI.  She did have mild worsening of the creatinine and was this was treated with IV fluid.  Patient presents today for cardiology office visit.  She is not very active at home however denies any chest pain or shortness of breath.  Colonoscopy is a very low risk procedure and she is cleared to proceed with colonoscopy after holding the Plavix for 5 days.  She has ileostomy and hoping for takedown soon.  That would be a high risk surgery , I plan to order a stress test.  The stress test does not need to be done prior to the colonoscopy as she is already cleared.  The purpose of the stress test is to risk stratify her prior to potential takedown ileostomy.   Past Medical History:  Diagnosis Date  . Anxiety   . Arthropathy, unspecified, site unspecified   . Blind loop syndrome   . Carotid artery disease ([place])    documented occlusion of left ICA  . Coronary artery disease    a. known 60-70% mid-LAD stenosis with caths in 1999, 2002, 2006, and 2012 showing stable anatomy b. low-risk NST in 10/2015  . Depression   . Diabetes insipidus ([place])   . Diabetes mellitus   . Diverticulosis   . Dizziness    chronic  . Dyslipidemia   . Dyspnea    with exertion  . Fatty liver   . GERD (gastroesophageal reflux disease)    hiatial hernia  .  Hepatomegaly   . Hiatal hernia   . HTN (hypertension)   . Hyperlipidemia   . Hypertension   . Irritable bowel syndrome   . Melanosis coli   . Metatarsal fracture right  . Normal nuclear stress test    nml 01/03/10;06/19/07  . Other, mixed, or unspecified nondependent drug abuse, unspecified   . Pancreatitis, chronic ([place])   . Peripheral vascular disease ([place]) 02/20/10   s/p stent of left SFA  . Seizure ([place]) 11/04/2018  . Sleep apnea   . Stroke ([place])   . Thyroid nodule   . Unspecified essential hypertension   . Vitamin B12 deficiency     Past Surgical History:  Procedure Laterality Date  . ABDOMINAL  HYSTERECTOMY     partial  . AUGMENTATION MAMMAPLASTY     bilateral 1976 retro   . BREAST ENHANCEMENT SURGERY    . CARDIAC CATHETERIZATION  07/07/98;08/31/01;03/04/05   60 -70% LAD  . CATARACT EXTRACTION    . COLON RESECTION N/A 10/14/2017   Procedure: EXPLORATORY LAPAROTOMY, EXTENDED RIGHT COLECTOMY; APPLICATION OF ABDOMINAL VACUUM DRESSING;  Surgeon: Jovita Kussmaul, MD;  Location: [facility];  Service: General;  Laterality: N/A;  . ENDOBRONCHIAL ULTRASOUND Bilateral 02/24/2017   Procedure: ENDOBRONCHIAL ULTRASOUND;  Surgeon: Collene Gobble, MD;  Location: [facility];  Service: Cardiopulmonary;  Laterality: Bilateral;  . EYE SURGERY Left Nov. 2016   Cataract  . EYE SURGERY Right 2001   Cataract  . FEMORAL ARTERY STENT     Left leg  . LAPAROTOMY N/A 10/16/2017   Procedure: EXPLORATORY LAPAROTOMY, PARTIAL OMENTECTOMY, RESECTION ISCHEMIC ILEUM 417EY, APPLICATION OF VAC ABDOMINAL DRESSING;  Surgeon: Armandina Gemma, MD;  Location: [facility];  Service: General;  Laterality: N/A;  . LAPAROTOMY N/A 10/18/2017   Procedure: RE-EXPLORATION OF ABDOMEN, ILEOSTOMY CREATION;  Surgeon: Stark Klein, MD;  Location: [facility];  Service: General;  Laterality: N/A;  . LUNG SURGERY  03/2017   Benign polyps removed  . PATELLA REALIGNMENT Left   . TONSILLECTOMY    . TOTAL ABDOMINAL HYSTERECTOMY    . WRIST SURGERY     right    Current Medications: Outpatient Medications Prior to Visit  Medication Sig Dispense Refill  . acetaminophen (TYLENOL) 325 MG tablet Take 650 mg by mouth every 6 (six) hours as needed for headache (pain).    Marland Kitchen amLODipine (NORVASC) 10 MG tablet Place 1 tablet (10 mg total) into feeding tube daily. (Patient taking differently: Take 10 mg by mouth daily. ) 30 tablet 0  . ARIPiprazole (ABILIFY) 5 MG tablet Place 1 tablet (5 mg total) into feeding tube daily. (Patient taking differently: Take 5 mg by mouth daily. ) 30 tablet 0  . atorvastatin (LIPITOR) 20 MG tablet Take 20 mg by mouth daily.    .  clopidogrel (PLAVIX) 75 MG tablet Take 75 mg by mouth daily.    . diphenoxylate-atropine (LOMOTIL) 2.5-0.025 MG tablet Take 1 tablet by mouth 2 (two) times daily. 60 tablet 2  . esomeprazole (NEXIUM) 40 MG capsule Take 40 mg by mouth daily.    . hydrOXYzine (ATARAX/VISTARIL) 25 MG tablet Take 25 mg by mouth at bedtime as needed (sleep).    Marland Kitchen levETIRAcetam (KEPPRA) 500 MG tablet Take 1 tablet (500 mg total) by mouth 2 (two) times daily. 60 tablet 1  . loperamide (IMODIUM) 2 MG capsule Take 2 mg by mouth 3 (three) times daily.    . metoprolol succinate (TOPROL-XL) 100 MG 24 hr tablet Take 100 mg by mouth daily. Take with  or immediately following a meal.    . mirtazapine (REMERON) 15 MG tablet Take 15 mg by mouth at bedtime.    . montelukast (SINGULAIR) 10 MG tablet Take 10 mg by mouth daily.    . nitroGLYCERIN (NITROSTAT) 0.4 MG SL tablet Place 1 tablet (0.4 mg total) under the tongue every 5 (five) minutes as needed for chest pain. 90 tablet 3  . Pancrelipase, Lip-Prot-Amyl, (ZENPEP) 40000-126000 units CPEP Take 1 capsule by mouth 3 (three) times daily with meals.    . pantoprazole (PROTONIX) 40 MG tablet Take 40 mg by mouth daily.    . Psyllium ([employer] NATURAL DAILY FIBER PO) Take 15 mLs by mouth See admin instructions. Mix one tablespoonful (15 mls) in water and drink by mouth twice daily    . triamcinolone cream (KENALOG) 0.1 % Apply 1 application topically 2 (two) times daily as needed (rash/itching).    . venlafaxine XR (EFFEXOR-XR) 150 MG 24 hr capsule Take 150 mg by mouth daily.    . vitamin B-12 (CYANOCOBALAMIN) 500 MCG tablet Take 500 mcg by mouth daily.    . Vitamin D, Ergocalciferol, (DRISDOL) 1.25 MG (50000 UT) CAPS capsule Take 50,000 Units by mouth every Wednesday.     . losartan (COZAAR) 25 MG tablet Take 1 tablet (25 mg total) by mouth daily. (Patient taking differently: Take 12.5 mg by mouth daily. ) 30 tablet 0   No facility-administered medications prior to visit.      Allergies:    Adhesive [tape]; Codeine; and Iodinated diagnostic agents   Social History   Socioeconomic History  . Marital status: Divorced    Spouse name: Not on file  . Number of children: 2  . Years of education: Not on file  . Highest education level: Not on file  Occupational History  . Occupation: retired    Fish farm manager: DISABLED  Social Needs  . Financial resource strain: Not on file  . Food insecurity:    Worry: Not on file    Inability: Not on file  . Transportation needs:    Medical: Not on file    Non-medical: Not on file  Tobacco Use  . Smoking status: Former Smoker    Packs/day: 1.00    Years: 50.00    Pack years: 50.00    Types: Cigarettes    Last attempt to quit: 10/14/2010    Years since quitting: 8.1  . Smokeless tobacco: Never Used  Substance and Sexual Activity  . Alcohol use: No  . Drug use: No  . Sexual activity: Not on file  Lifestyle  . Physical activity:    Days per week: Not on file    Minutes per session: Not on file  . Stress: Not on file  Relationships  . Social connections:    Talks on phone: Not on file    Gets together: Not on file    Attends religious service: Not on file    Active member of club or organization: Not on file    Attends meetings of clubs or organizations: Not on file    Relationship status: Not on file  Other Topics Concern  . Not on file  Social History Narrative  . Not on file     Family History:  The patient's family history includes Colon cancer in her paternal grandfather; Hypertension in her father and mother; Osteoarthritis in her mother; Pulmonary embolism in her mother; Stroke in her father and another family member.   ROS:   Please see the history  of present illness.    ROS All other systems reviewed and are negative.   PHYSICAL EXAM:   VS:  BP (!) 144/80   Pulse (!) 57   Ht 5\' 3"  (1.6 m)   Wt 155 lb 12.8 oz (70.7 kg)   BMI 27.60 kg/m    GEN: Well nourished, well developed, in no acute distress  HEENT:  normal  Neck: no JVD, carotid bruits, or masses Cardiac: RRR; no murmurs, rubs, or gallops,no edema  Respiratory:  clear to auscultation bilaterally, normal work of breathing GI: soft, nontender, nondistended, + BS MS: no deformity or atrophy  Skin: warm and dry, no rash Neuro:  Alert and Oriented x 3, Strength and sensation are intact Psych: euthymic mood, full affect  Wt Readings from Last 3 Encounters:  12/08/18 155 lb 12.8 oz (70.7 kg)  12/03/18 156 lb (70.8 kg)  11/04/18 154 lb 8.7 oz (70.1 kg)      Studies/Labs Reviewed:   EKG:  EKG is ordered today.  The ekg ordered today demonstrates sinus bradycardia, heart rate 57  Recent Labs: 11/03/2018: Magnesium 1.7 11/04/2018: ALT 27 11/05/2018: BUN 15; Creatinine, Ser 1.69; Hemoglobin 10.4; Platelets 152; Potassium 3.7; Sodium 140   Lipid Panel    Component Value Date/Time   CHOL 106 10/25/2017 0440   TRIG 171 (H) 10/27/2017 0428   HDL 18 (L) 10/25/2017 0440   CHOLHDL 5.9 10/25/2017 0440   VLDL 40 10/25/2017 0440   LDLCALC 48 10/25/2017 0440    Additional studies/ records that were reviewed today include:   Echo 10/25/2017  LV EF: 55% -   60%  ------------------------------------------------------------------- Indications:      CVA 436.  ------------------------------------------------------------------- History:   PMH:   Coronary artery disease.  Risk factors: Hypertension. Diabetes mellitus. Dyslipidemia.  ------------------------------------------------------------------- Study Conclusions  - Left ventricle: The cavity size was normal. Wall thickness was   normal. Systolic function was normal. The estimated ejection   fraction was in the range of 55% to 60%. Although no diagnostic   regional wall motion abnormality was identified, this possibility   cannot be completely excluded on the basis of this study. Doppler   parameters are consistent with abnormal left ventricular   relaxation (grade 1 diastolic  dysfunction). - Aortic valve: Poorly visualized. There was no stenosis. - Mitral valve: Mildly calcified annulus. There was trivial   regurgitation. - Right ventricle: The cavity size was normal. Systolic function   was normal. - Pulmonary arteries: No complete TR doppler jet so unable to   estimate PA systolic pressure. - Inferior vena cava: The vessel was normal in size. The   respirophasic diameter changes were in the normal range (>= 50%),   consistent with normal central venous pressure.  Impressions:  - Normal LV size with EF 55-60%. Normal RV size and systolic   function. No significant valvular abnormalities.   ASSESSMENT:    1. Pre-op testing   2. Arteriosclerosis of coronary artery   3. Essential hypertension   4. Medication management   5. Bilateral carotid artery stenosis      PLAN:  In order of problems listed above:  1. Preoperative clearance: she is cleared to proceed with colonoscopy which is a low risk procedure.  She will need to hold her Plavix for 5 days prior to the procedure and restart as soon as possible after the procedure.  She is in anticipation of reversing her ileostomy at some point, that would be a high risk procedure.  I recommended  to [place] stress test for risk stratification.  This does not need to be done prior to the colonoscopy.  2. CAD: She has a known 41 to 70% stenosis in the LAD on multiple previous cardiac catheterization.  Given lack of chest discomfort, cleared to hold Plavix for 5 days prior to the colonoscopy.  3. Hypertension: Blood pressure elevated today.  Will increase losartan to 50 mg daily  4. Bilateral carotid artery disease: Last carotid ultrasound obtained on 08/13/2018 showed 40 to 59% right ICA stenosis, totally occluded left carotid artery.    Medication Adjustments/Labs and Tests Ordered: Current medicines are reviewed at length with the patient today.  Concerns regarding medicines are outlined above.   Medication changes, Labs and Tests ordered today are listed in the Patient Instructions below. Patient Instructions  Medication Instructions:   INCREASE LOSARTAN TO 50 MG DAILY  If you need a refill on your cardiac medications before your next appointment, please call your pharmacy.   Lab work: You will need to return to the office in 2 weeks for labs to be drawn. Those labs are: BMET  If you have labs (blood work) drawn today and your tests are completely normal, you will receive your results only by: Marland Kitchen MyChart Message (if you have MyChart) OR . A paper copy in the mail If you have any lab test that is abnormal or we need to change your treatment, we will call you to review the results.  Testing/Procedures: Your physician has requested that you have a Lexiscan myoview. For further information please visit [email or web address]. Please follow instruction sheet, as given.  To be scheduled within a month.   This is done at [facility]  Follow-Up: At [facility], you and your health needs are our priority.  As part of our continuing mission to provide you with exceptional heart care, we have created designated Provider Care Teams.  These Care Teams include your primary Cardiologist (physician) and Advanced Practice Providers (APPs -  Physician Assistants and Nurse Practitioners) who all work together to provide you with the care you need, when you need it. You will need a follow up appointment in 3 months.  Please call our office 2 months in advance to schedule this appointment.  You may see Peter Martinique, MD or one of the following Advanced Practice Providers on your designated Care Team: [place], [place] . Fabian Sharp, PA-C  Any Other Special Instructions Will Be Listed Below (If Applicable).  HOLD Plavix for 5 days prior to current surgery and any other surgery you may have      Hilbert Corrigan, [place]  12/09/2018 3:46 PM    [address], [place], [place]  [address] Phone: [phone]; Fax: [phone]

## 2018-12-09 ENCOUNTER — Other Ambulatory Visit: Payer: Self-pay

## 2018-12-09 ENCOUNTER — Encounter: Payer: Self-pay | Admitting: Physician Assistant

## 2018-12-09 MED ORDER — LOSARTAN POTASSIUM 50 MG PO TABS: 50.0000 mg | ORAL_TABLET | Freq: Every day | ORAL | 3 refills | Status: DC

## 2018-12-11 ENCOUNTER — Telehealth (HOSPITAL_COMMUNITY): Payer: Self-pay

## 2018-12-11 ENCOUNTER — Telehealth: Payer: Self-pay | Admitting: Gastroenterology

## 2018-12-11 NOTE — Telephone Encounter (Signed)
Reviewed instructions regarding prep and pt verbalized understanding, questions were answered.

## 2018-12-11 NOTE — Telephone Encounter (Signed)
Pls call pt, she has questions regarding instructions for colonoscopy.

## 2018-12-11 NOTE — Telephone Encounter (Signed)
Encounter complete. 

## 2018-12-11 NOTE — Telephone Encounter (Signed)
Exerpt from cardiology note 12/08/2018:  1. Preoperative clearance: she is cleared to proceed with colonoscopy which is a low risk procedure.  She will need to hold her Plavix for 5 days prior to the procedure and restart as soon as possible after the procedure.  She is in anticipation of reversing her ileostomy at some point, that would be a high risk procedure.  I recommended to [place] stress test for risk stratification.  This does not need to be done prior to the colonoscopy.  I have contacted patient and advised that she has been cleared to hold plavix 5 days prior to her colonoscopy procedure. She verbalizes understanding.

## 2018-12-16 ENCOUNTER — Encounter: Payer: Self-pay | Admitting: Internal Medicine

## 2018-12-16 ENCOUNTER — Ambulatory Visit (HOSPITAL_COMMUNITY)
Admission: RE | Admit: 2018-12-16 | Discharge: 2018-12-16 | Disposition: A | Discharge status: Home / Self Care | Payer: 59 | Source: Ambulatory Visit | Attending: Cardiovascular Disease | Admitting: Cardiovascular Disease

## 2018-12-16 DIAGNOSIS — Z01818 Encounter for other preprocedural examination: Secondary | ICD-10-CM

## 2018-12-18 ENCOUNTER — Other Ambulatory Visit: Payer: Self-pay

## 2018-12-18 ENCOUNTER — Ambulatory Visit (HOSPITAL_COMMUNITY)
Admission: RE | Admit: 2018-12-18 | Discharge: 2018-12-18 | Disposition: A | Discharge status: Home / Self Care | Payer: 59 | Source: Ambulatory Visit | Attending: Cardiovascular Disease | Admitting: Cardiovascular Disease

## 2018-12-18 DIAGNOSIS — Z8249 Family history of ischemic heart disease and other diseases of the circulatory system: Secondary | ICD-10-CM | POA: Insufficient documentation

## 2018-12-18 DIAGNOSIS — Z87891 Personal history of nicotine dependence: Secondary | ICD-10-CM | POA: Insufficient documentation

## 2018-12-18 DIAGNOSIS — G4733 Obstructive sleep apnea (adult) (pediatric): Secondary | ICD-10-CM | POA: Insufficient documentation

## 2018-12-18 DIAGNOSIS — Z01818 Encounter for other preprocedural examination: Secondary | ICD-10-CM | POA: Diagnosis not present

## 2018-12-18 DIAGNOSIS — I10 Essential (primary) hypertension: Secondary | ICD-10-CM | POA: Diagnosis not present

## 2018-12-18 DIAGNOSIS — R42 Dizziness and giddiness: Secondary | ICD-10-CM | POA: Insufficient documentation

## 2018-12-18 DIAGNOSIS — K219 Gastro-esophageal reflux disease without esophagitis: Secondary | ICD-10-CM | POA: Diagnosis not present

## 2018-12-18 DIAGNOSIS — Z8673 Personal history of transient ischemic attack (TIA), and cerebral infarction without residual deficits: Secondary | ICD-10-CM | POA: Insufficient documentation

## 2018-12-18 DIAGNOSIS — E1151 Type 2 diabetes mellitus with diabetic peripheral angiopathy without gangrene: Secondary | ICD-10-CM | POA: Insufficient documentation

## 2018-12-18 DIAGNOSIS — I2510 Atherosclerotic heart disease of native coronary artery without angina pectoris: Secondary | ICD-10-CM | POA: Insufficient documentation

## 2018-12-18 LAB — MYOCARDIAL PERFUSION IMAGING
CHL CUP NUCLEAR SSS: 5
LV dias vol: 68 mL (ref 46–106)
LVSYSVOL: 35 mL
Peak HR: 100 {beats}/min
Rest HR: 86 {beats}/min
SDS: 3
SRS: 2
TID: 0.94

## 2018-12-18 MED ORDER — TECHNETIUM TC 99M TETROFOSMIN IV KIT
10.2000 | PACK | Freq: Once | INTRAVENOUS | Status: AC | PRN
  Administered 2018-12-18: 10.2 via INTRAVENOUS
  Filled 2018-12-18: qty 11

## 2018-12-18 MED ORDER — LOSARTAN POTASSIUM 100 MG PO TABS: ORAL_TABLET | ORAL | 3 refills | Status: DC

## 2018-12-18 MED ORDER — ADENOSINE (DIAGNOSTIC) 3 MG/ML IV SOLN
0.5600 mg/kg | Freq: Once | INTRAVENOUS | Status: AC
  Administered 2018-12-18: 39.3 mg via INTRAVENOUS

## 2018-12-18 MED ORDER — TECHNETIUM TC 99M TETROFOSMIN IV KIT
32.1000 | PACK | Freq: Once | INTRAVENOUS | Status: AC | PRN
  Administered 2018-12-18: 32.1 via INTRAVENOUS
  Filled 2018-12-18: qty 33

## 2018-12-21 ENCOUNTER — Other Ambulatory Visit: Payer: Self-pay

## 2018-12-21 DIAGNOSIS — I429 Cardiomyopathy, unspecified: Secondary | ICD-10-CM

## 2018-12-21 NOTE — Progress Notes (Signed)
Verbal order

## 2018-12-21 NOTE — Progress Notes (Signed)
Order placed for ECHO

## 2018-12-21 NOTE — Progress Notes (Signed)
The patient has been notified of the result and verbalized understanding. Patient asked when does she have to have the ECHO done because she is having a colonoscopy done on March 17th.

## 2018-12-28 ENCOUNTER — Telehealth: Payer: Self-pay | Admitting: *Deleted

## 2018-12-28 NOTE — Telephone Encounter (Signed)
Covid-19 travel screening questions  Have you traveled in the last 14 days? no If yes where?  Do you now or have you had a fever in the last 14 days? no  Do you have any respiratory symptoms of shortness of breath or cough now or in the last 14 days? no  Do you have any family members or close contacts with diagnosed or suspected Covid-19? no       

## 2018-12-29 ENCOUNTER — Other Ambulatory Visit: Payer: Self-pay

## 2018-12-29 ENCOUNTER — Ambulatory Visit (AMBULATORY_SURGERY_CENTER): Payer: 59 | Admitting: Gastroenterology

## 2018-12-29 ENCOUNTER — Encounter: Payer: Self-pay | Admitting: Gastroenterology

## 2018-12-29 VITALS — BP 149/69 | HR 66 | Temp 98.6°F | Resp 16 | Ht 63.0 in | Wt 156.0 lb

## 2018-12-29 DIAGNOSIS — Z860101 Personal history of adenomatous and serrated colon polyps: Secondary | ICD-10-CM

## 2018-12-29 DIAGNOSIS — K599 Functional intestinal disorder, unspecified: Secondary | ICD-10-CM | POA: Diagnosis not present

## 2018-12-29 DIAGNOSIS — Z86010 Personal history of colonic polyps: Secondary | ICD-10-CM

## 2018-12-29 DIAGNOSIS — K529 Noninfective gastroenteritis and colitis, unspecified: Secondary | ICD-10-CM | POA: Diagnosis not present

## 2018-12-29 DIAGNOSIS — K559 Vascular disorder of intestine, unspecified: Secondary | ICD-10-CM

## 2018-12-29 MED ORDER — SODIUM CHLORIDE 0.9 % IV SOLN: 500.0000 mL | Freq: Once | INTRAVENOUS | Status: DC

## 2018-12-29 NOTE — Progress Notes (Signed)
Called to room to assist during endoscopic procedure.  Patient ID and intended procedure confirmed with present staff. Received instructions for my participation in the procedure from the performing physician.  

## 2018-12-29 NOTE — Op Note (Addendum)
[place] Patient Name: [name] Procedure Date: 12/29/2018 2:29 PM MRN: [id number] Endoscopist: Ladene Artist , MD Age: 73 Referring MD:  Date of Birth: Nov 21, 1945 Gender: Female Account #: [id number] Procedure:                Flexible Sigmoidoscopy Indications:              Surveillance: Personal history of adenomatous                            polyps on last colonoscopy 3 years ago. 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 Medicines:                Monitored Anesthesia Care Procedure:                Pre-Anesthesia Assessment:                           - Prior to the procedure, a History and Physical                            was performed, and patient medications and                            allergies were reviewed. The patient's tolerance of                            previous anesthesia was also reviewed. The risks                            and benefits of the procedure and the sedation                            options and risks were discussed with the patient.                            All questions were answered, and informed consent                            was obtained. Prior Anticoagulants: The patient has                            taken Plavix (clopidogrel), last dose was 5 days                            prior to procedure. ASA Grade Assessment: III - A                            patient with severe systemic disease. After                            reviewing the risks and benefits, the patient was  deemed in satisfactory condition to undergo the                            procedure.                           After obtaining informed consent, the scope was                            passed under direct vision. The Endoscope was                            introduced through the anus and advanced to the                            sigmoid colon. After obtaining informed  consent,                            the scope was passed under direct vision.The                            flexible sigmoidoscopy was technically difficult                            and complex due to restricted mobility of the                            colon. Unable to advance beyond 25 cm in sigmoid                            colon even after changing to the upper endoscope. Scope In: Scope Out: Findings:                 The perianal and digital rectal examinations were                            normal.                           Diffuse mild mucosal changes characterized by                            erythema, friability and loss of vascularity were                            found in the rectum and in the sigmoid colon.                            Biopsies were taken with a cold forceps for                            histology.                           - Unable to advance beyond 25 cm due to restricted  mobility of the colon, suspected adhesions.                           Internal hemorrhoids were found during endoscopy,                            did not retroflex due to friable mucosa. The                            hemorrhoids were small and Grade I (internal                            hemorrhoids that do not prolapse). Complications:            No immediate complications. Estimated Blood Loss:     Estimated blood loss was minimal. Impression:               - Diffuse mild mucosal changes were found in the                            rectum and in the sigmoid colon secondary to                            diversion colitis. Biopsied.                           - Unable to advance beyond 25 cm due to restricted                            mobility of the colon.                           - Internal hemorrhoids. Recommendation:           - Discharge patient to home.                           - Patient has a contact number available for                             emergencies. The signs and symptoms of potential                            delayed complications were discussed with the                            patient. Return to normal activities tomorrow.                            Written discharge instructions were provided to the                            patient.                           - Resume previous diet today.                           -  Resume Plavix tomorrow, mgmt per prescribing                            provider.                           - Await pathology results.                           - Repeat flexible sigmoidoscopy in 3 years for                            surveillance.                           - Follow up with [facility] as planned. Ladene Artist, MD 12/29/2018 2:59:51 PM This report has been signed electronically.

## 2018-12-29 NOTE — Progress Notes (Signed)
PT taken to PACU. Monitors in place. VSS. Report given to RN. 

## 2018-12-29 NOTE — Patient Instructions (Signed)
YOU HAD AN ENDOSCOPIC PROCEDURE TODAY AT [facility]:   Refer to the procedure report that was given to you for any specific questions about what was found during the examination.  If the procedure report does not answer your questions, please call your gastroenterologist to clarify.  If you requested that your care partner not be given the details of your procedure findings, then the procedure report has been included in a sealed envelope for you to review at your convenience later.  YOU SHOULD EXPECT: Some feelings of bloating in the abdomen. Passage of more gas than usual.  Walking can help get rid of the air that was put into your GI tract during the procedure and reduce the bloating. If you had a lower endoscopy (such as a colonoscopy or flexible sigmoidoscopy) you may notice spotting of blood in your stool or on the toilet paper. If you underwent a bowel prep for your procedure, you may not have a normal bowel movement for a few days.  Please Note:  You might notice some irritation and congestion in your nose or some drainage.  This is from the oxygen used during your procedure.  There is no need for concern and it should clear up in a day or so.  SYMPTOMS TO REPORT IMMEDIATELY:   Following lower endoscopy (colonoscopy or flexible sigmoidoscopy):  Excessive amounts of blood in the stool  Significant tenderness or worsening of abdominal pains  Swelling of the abdomen that is new, acute  Fever of 100F or higher   For urgent or emergent issues, a gastroenterologist can be reached at any hour by calling [phone].  You may restart your plavix tomorrow per Dr. Fuller Plan.   DIET:  We do recommend a small meal at first, but then you may proceed to your regular diet.  Drink plenty of fluids but you should avoid alcoholic beverages for 24 hours.  ACTIVITY:  You should plan to take it easy for the rest of today and you should NOT DRIVE or use heavy machinery until tomorrow  (because of the sedation medicines used during the test).    FOLLOW UP: Our staff will call the number listed on your records the next business day following your procedure to check on you and address any questions or concerns that you may have regarding the information given to you following your procedure. If we do not reach you, we will leave a message.  However, if you are feeling well and you are not experiencing any problems, there is no need to return our call.  We will assume that you have returned to your regular daily activities without incident.  You will need a repeat colonoscopy in 3 years.  If any biopsies were taken you will be contacted by phone or by letter within the next 1-3 weeks.  Please call us at [phone] if you have not heard about the biopsies in 3 weeks.    SIGNATURES/CONFIDENTIALITY: You and/or your care partner have signed paperwork which will be entered into your electronic medical record.  These signatures attest to the fact that that the information above on your After Visit Summary has been reviewed and is understood.  Full responsibility of the confidentiality of this discharge information lies with you and/or your care-partner.  Read all handouts given to you by your recovery room nurse.

## 2018-12-30 ENCOUNTER — Telehealth: Payer: Self-pay

## 2018-12-30 ENCOUNTER — Encounter: Payer: 59 | Admitting: Gastroenterology

## 2018-12-30 NOTE — Telephone Encounter (Signed)
Follow up call, no message, mailbox full

## 2018-12-30 NOTE — Telephone Encounter (Signed)
Second follow up call made, no answer, pt's answering machine was not accepting messages.

## 2019-01-04 DIAGNOSIS — E46 Unspecified protein-calorie malnutrition: Secondary | ICD-10-CM | POA: Insufficient documentation

## 2019-01-06 ENCOUNTER — Encounter: Payer: Self-pay | Admitting: Neurology

## 2019-01-06 ENCOUNTER — Encounter: Payer: Self-pay | Admitting: Gastroenterology

## 2019-01-06 ENCOUNTER — Telehealth: Payer: Self-pay | Admitting: Neurology

## 2019-01-06 NOTE — Telephone Encounter (Signed)
Called the patient to inform them that our office has placed new protocols in place for our office visits. Due to the virus pandemic our office is reducing our number of office visits in order to minimize the risk to our patients and healthcare providers. Advised that our office is now providing the capability to offer the patients phone visits at this time. Informed of what that process looks like and informed that the telephone office visit will still be billed through insurance and due to [place] we need them to know since the appointment is taking place over the phone, we can't guarantee the security of the phone line. With that said if we do move forward I would have to get verbal consent to completed the call over the phone. Pt gave verbal consent to complete the telephone visit. Advised the patient that we would prefer a video visit and asked if she had that capability and she does not. Offered to push apt out and she was ok with doing the phone visit. Reviewed the patient's,PCP, allergies, what she was coming in for. 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 As I was half way through the list the patient's phone lost connection. I was unable to completed the verification of the medications.   IF patient calls back- please make her aware that the office visit scheduled for tomorrow at 2 pm will be a phone visit and the patient will need to be by phone and assure connection is good to complete the visit.

## 2019-01-07 ENCOUNTER — Other Ambulatory Visit: Payer: Self-pay

## 2019-01-07 ENCOUNTER — Ambulatory Visit (INDEPENDENT_AMBULATORY_CARE_PROVIDER_SITE_OTHER): Payer: 59 | Admitting: Neurology

## 2019-01-07 DIAGNOSIS — K902 Blind loop syndrome, not elsewhere classified: Secondary | ICD-10-CM

## 2019-01-07 DIAGNOSIS — N2889 Other specified disorders of kidney and ureter: Secondary | ICD-10-CM

## 2019-01-07 DIAGNOSIS — I739 Peripheral vascular disease, unspecified: Secondary | ICD-10-CM

## 2019-01-07 DIAGNOSIS — E538 Deficiency of other specified B group vitamins: Secondary | ICD-10-CM | POA: Diagnosis not present

## 2019-01-07 DIAGNOSIS — I151 Hypertension secondary to other renal disorders: Secondary | ICD-10-CM | POA: Diagnosis not present

## 2019-01-07 DIAGNOSIS — R569 Unspecified convulsions: Secondary | ICD-10-CM | POA: Diagnosis not present

## 2019-01-07 DIAGNOSIS — I2510 Atherosclerotic heart disease of native coronary artery without angina pectoris: Secondary | ICD-10-CM

## 2019-01-07 DIAGNOSIS — Z8719 Personal history of other diseases of the digestive system: Secondary | ICD-10-CM

## 2019-01-07 DIAGNOSIS — I63232 Cerebral infarction due to unspecified occlusion or stenosis of left carotid arteries: Secondary | ICD-10-CM

## 2019-01-07 MED ORDER — LEVETIRACETAM 500 MG PO TABS: 500.0000 mg | ORAL_TABLET | Freq: Two times a day (BID) | ORAL | 3 refills | Status: DC

## 2019-01-07 NOTE — Progress Notes (Signed)
Virtual Visit via Telephone Note  I connected with [name] on 01/07/19 at  2:00 PM EDT by telephone and verified that I am speaking with the correct person using two identifiers.   I discussed the limitations, risks, security and privacy concerns of performing an evaluation and management service by telephone and the availability of in person appointments. I also discussed with the patient that there may be a patient responsible charge related to this service. The patient expressed understanding and agreed to proceed.   History of Present Illness: Mrs. [name] agreed to a virtual visit by telephone but she does not have the capacity for a video conferencing call.  I asked her first about the history of a new onset seizure.  She stated that the seizure occurred witnessed by a boyfriend on 03 November 2018.  She was [facility] at [facility].    She stated that she drank 2 glasses of red wine before her seizure which happened between 9 and 10 PM.  It was before she went to bed and after she had concluded her dinner.  She had a slight aura describes the feeling as if she may faint she sat down and never fell but ceased in a seated position.  She has no recollection of any further events.  And details.  She states that she did not bite her tongue did not get injured and did not to her knowledge have bowel or bladder incontinence.  History of health: the patient has a history of strokes, these are remote about a year ago they were discovered and it was about a small stroke she has a history of hypertension chronic pancreatitis, fatty liver disease, ischemic bowel disease and partial colectomy she still has a pouch at this time.  Her EMS notes state that the patient on route to the ER had another episode of tonic-clonic seizure and was given IV Versed.  She woke up in the ED her CT head was unremarkable her MRI brain was also negative for any acute  event.  She was evaluated by neurologist Dr. Rodena Medin on call, who prescribed loading was 1.5 g of Keppra IV and decide discharge to the next day on 500 mg twice daily of Keppra by mouth.   She recovered without any headache, chest pain, shortness of breath or vertigo.  She states that this was a single event; she never had a seizure before or after.    Hospitalization records include an MRI of the brain without contrast from 22 January which was compared to a CT of the head from the day before, she has left parietal encephalomalacia which is described as a small area and scattered lacunar infarcts and to lesser extent pontine white matter hyperintensities none of these seem to be acute.  She has a chronically occluded left internal carotid artery, she has an old left parietal infarct.  White blood cell count at the time was 14 point 5K MCHC was 29.8 normal platelets, lymphocytes were high at 6.4, immature granulocytes were high 1.15 case prothrombin time was 15.7 and elevated low normal magnesium was 1.7 mg/dL phosphorus was high 6.1, CO2 was very low 9 mmol/L chloride was 112 an elevated, glucose was 108 but the patient had not been fast, creatinine was 1.88, glomerular filtration rate was estimated at 26 mL/min which would be chronic kidney disease stage III.  Very high anion gap.  There was no alcohol intoxication found, no other substances were  tested.  A repeat CBC with differential the next day showed normal white blood cell counts, and anemia hemoglobin 10.8 hematocrit 32.9, CO2 was now 20 still low but improved, total protein 5.7 low but improved, and the GFR was 31.  EEG showed irregular left temporoparietal slowing and focal activity which would fit the encephalomalacia region.    She had suffered from pancreatitis about 5 years ago.  She has coronary artery disease documented since 1999 and multiple catheterizations, stable anatomy since 2017 mid LAD stenosis around 70%.  She was morbidly obese  before she received a colectomy due to ischemic bowel disease in January 2019 and she lost significant weight.  She also states that the stool is rather poorly formed, she loses a lot of fluid and protein through the gut, we will discuss this with the chemical and metabolic panel in context.  She has dyslipidemia dyspnea with exertion hiatal hernia hypertension, melanosis coli, peripheral vascular disease with a stent in the left lower extremity since 2011, she had multiple strokes, thyroid nodules, vitamin B12 deficiency with pernicious anemia.  While I spoke to the patient she stated that her medication list was complete including Abilify, and that she was started on 2 antiseizure medicines but I could only find [place].  I asked her that if she still takes Keppra 500 mg twice daily as prescribed and she stated she only takes it once a day. She had received a script for 30 tab and one refill, would have by now been out since February 2020.  She also could not answer my question about changes in the Keppra prescription perhaps from primary care?  Nor if her Keppra was changed to a X release time?Marland Kitchen  When I asked her if she could look at the bottle of her prescription medication this patient , who was advised not to drive for 6 months after a seizure and is in a high risk group for COVID-19 serious health complications, stated that she was at the house of a friend to whom she had traveled.     Observations/Objective:   This patient is at high risk for future seizures, deriving from the the left brain focus.   Assessment and Plan: Use Keppra as prescribed - 500 mg bid po.  Follow PCP for CKD, B12 deficiency and anemia.    Follow Up Instructions: Per [facility] stashes this patient is not advised to drive she has an abnormal EEG and she has to be seizure-free for 6 months on medication before she could resume driving, and she can only drive as long as she continues to take her medication  compliantly.  This will not be reinstated before 05 May 2019.  I will order a repeat EEG in the next 2-3 month before driving previleges can be reinstated.  We will at that time order a Keppra level for compliance surveillance.     I discussed the assessment and treatment plan with the patient. The patient was provided an opportunity to ask questions and all were answered. The patient agreed with the plan and demonstrated an understanding of the instructions.   The patient was advised to call back or seek an in-person evaluation if the symptoms worsen or if the condition fails to improve as anticipated.  I provided 30 minutes of non-face-to-face time during this encounter.   Larey Seat, MD

## 2019-01-07 NOTE — Patient Instructions (Signed)
This patient is at high risk for future seizures, deriving from the the left brain focus.   Assessment and Plan: Use Keppra as prescribed - 500 mg bid po.  Follow PCP for CKD, B12 deficiency and anemia.    Follow Up Instructions: Per [facility] stashes this patient is not advised to drive she has an abnormal EEG and she has to be seizure-free for 6 months on medication before she could resume driving, and she can only drive as long as she continues to take her medication compliantly.  This will not be reinstated before 05 May 2019.  I will order a repeat EEG in the next 2-3 month  Seizure, Adult When you have a seizure:  Parts of your body may move.  You may have a change in how aware or awake (conscious) you are.  You may shake (convulse). Seizures usually last from 30 seconds to 2 minutes. Usually, they are not harmful unless they last a long time. What are the signs or symptoms? Common symptoms of this condition include:  Shaking (convulsions).  Stiffness in the body.  Passing out (losing consciousness).  Uncontrolled movements in the: ? Arms or legs. ? Eyes. ? Head. ? Mouth. Some people have symptoms right before a seizure happens. These symptoms may include:  Fear.  Worry (anxiety).  Feeling like you are going to throw up (nausea).  Feeling like the room is spinning (vertigo).  Feeling like you saw or heard something before (dj vu).  Odd tastes or smells.  Changes in vision, such as seeing flashing lights or spots. Follow these instructions at home: Medicines   Take over-the-counter and prescription medicines only as told by your doctor.  Do not eat or drink anything that may keep your medicine from working, such as alcohol. Activity  Do not do any activities that would be dangerous if you had another seizure, like driving or swimming. Wait until your doctor says it is safe for you to do them.  If you live in the [place] ask your local DMV  (department of motor vehicles) when you can drive.  Get plenty of rest. Teaching others   Teach friends and family what to do when you have a seizure. They should: ? Lay you on the ground. ? Protect your head and body. ? Loosen any tight clothing around your neck. ? Turn you on your side. ? Not hold you down. ? Not put anything into your mouth. ? Know whether or not you need emergency care. ? Stay with you until you are better. General instructions  Contact your doctor each time you have a seizure.  Avoid anything that gives you seizures.  Keep a seizure diary. Write down: ? What you think caused each seizure. ? What you remember about each seizure.  Keep all follow-up visits as told by your doctor. This is important. Contact a doctor if:  You have another seizure.  You have seizures more often.  There is any change in what happens during your seizures.  You keep having seizures with treatment.  You have symptoms of being sick or having an infection. Get help right away if:  You have a seizure: ? That lasts longer than 5 minutes. ? That is different than seizures you had before. ? That makes it harder to breathe. ? After you hurt your head.  After a seizure, you cannot speak or use a part of your body.  After a seizure, you are confused or have a bad  headache.  You have two or more seizures in a row.  You are having seizures more often.  You do not wake up right after a seizure.  You get hurt during a seizure. In an emergency:  These symptoms may be an emergency. Do not wait to see if the symptoms will go away. Get medical help right away. Call your local emergency services (911 in the [place] Do not drive yourself to the hospital. Summary  Seizures usually last from 30 seconds to 2 minutes. Usually, they are not harmful unless they last a long time.  Do not eat or drink anything that may keep your medicine from working, such as alcohol.  Teach friends  and family what to do when you have a seizure.  Contact your doctor each time you have a seizure. This information is not intended to replace advice given to you by your health care provider. Make sure you discuss any questions you have with your health care provider. Document Released: 03/18/2008 Document Revised: 06/24/2018 Document Reviewed: 11/06/2017 Elsevier Interactive Patient Education  2019 [employer].

## 2019-02-01 ENCOUNTER — Other Ambulatory Visit: Payer: Self-pay

## 2019-02-01 DIAGNOSIS — I6522 Occlusion and stenosis of left carotid artery: Secondary | ICD-10-CM

## 2019-02-01 DIAGNOSIS — I739 Peripheral vascular disease, unspecified: Secondary | ICD-10-CM

## 2019-02-01 DIAGNOSIS — I779 Disorder of arteries and arterioles, unspecified: Secondary | ICD-10-CM

## 2019-02-01 DIAGNOSIS — I6521 Occlusion and stenosis of right carotid artery: Secondary | ICD-10-CM

## 2019-02-09 IMAGING — CT CT CHEST W/O CM
2 of 4 series · 13 of 36 positions shown, 16 images · non-contrast
Comparison: CT of the abdomen and pelvis performed 01/17/2017, and
CT of the chest performed 08/18/2017

CLINICAL DATA: Code sepsis. Tachycardia and decreased respiratory
rate. Mid abdominal pain, acute onset.

EXAM:
CT CHEST, ABDOMEN AND PELVIS WITHOUT CONTRAST
TECHNIQUE: Multidetector CT imaging of the chest, abdomen and pelvis was
performed following the standard protocol without IV contrast.

[Series 2: cap w/o · axial · non-contrast · 0.71mm/px · z∈[-654,-164]mm · 10 of 116 slices shown, 13 images]
[im 9/116  mediastinal]
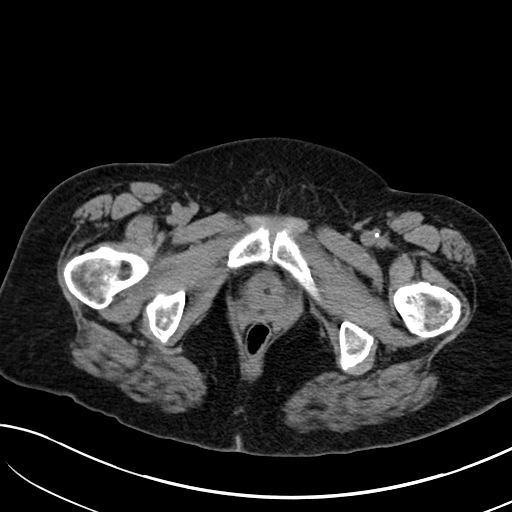
[im 9/116  lung]
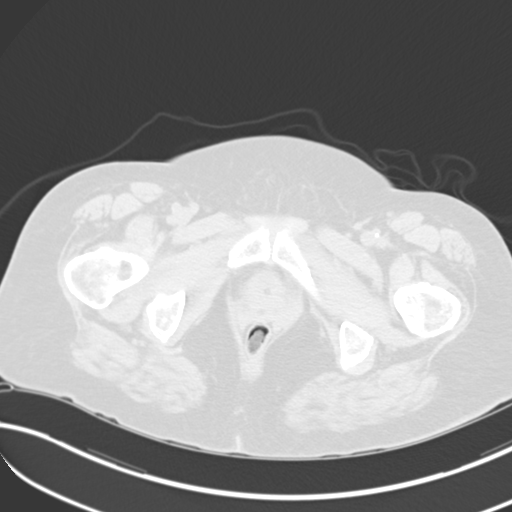
[im 17/116  lung]
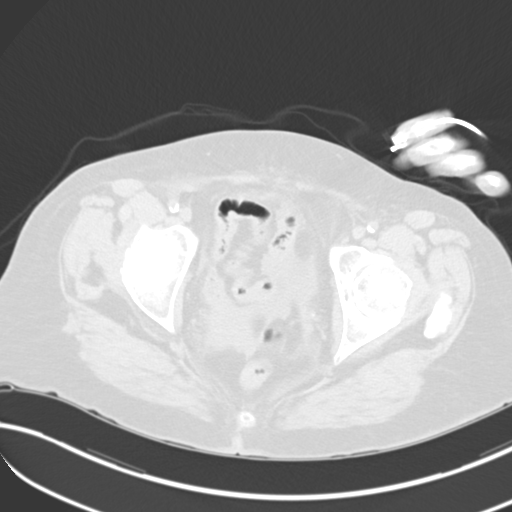
[im 33/116  lung]
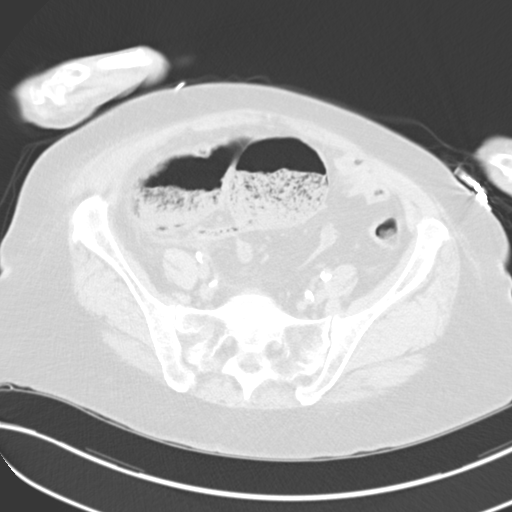
[im 42/116  lung]
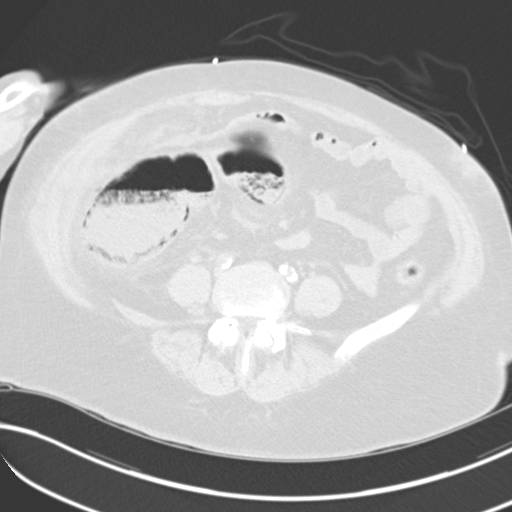
[im 50/116  mediastinal]
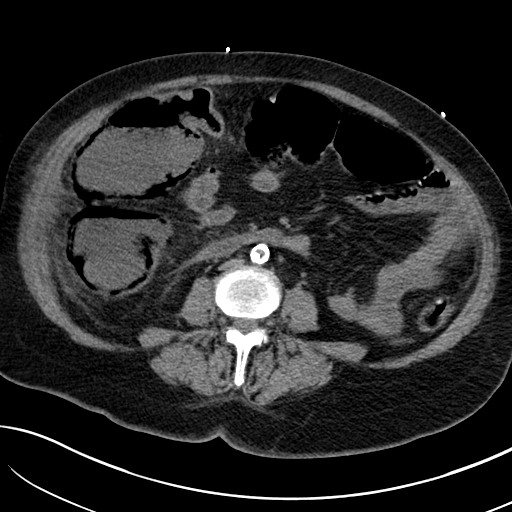
[im 50/116  lung]
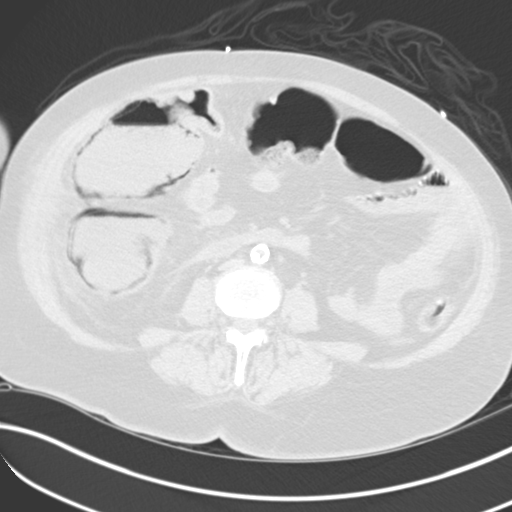
[im 66/116  lung]
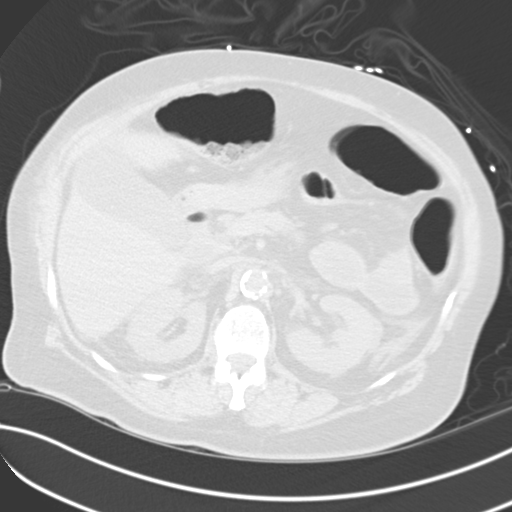
[im 74/116  lung]
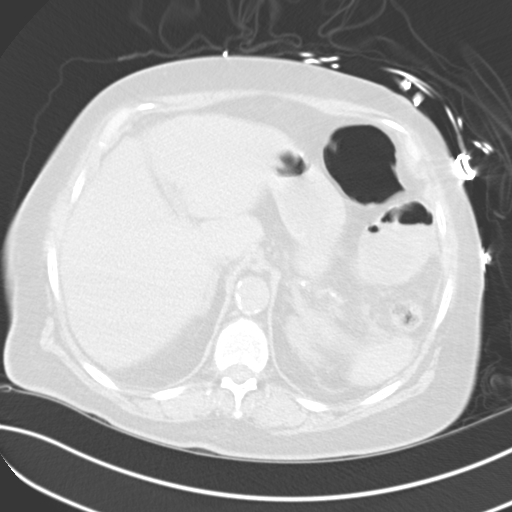
[im 83/116  lung]
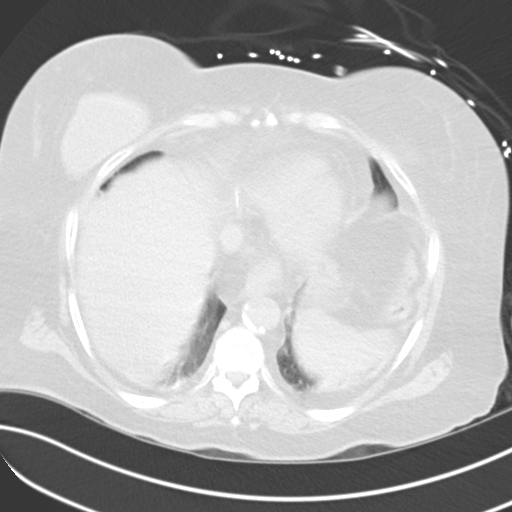
[im 99/116  mediastinal]
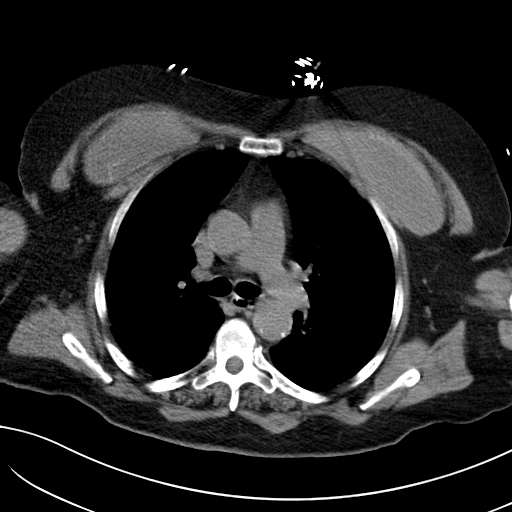
[im 99/116  lung]
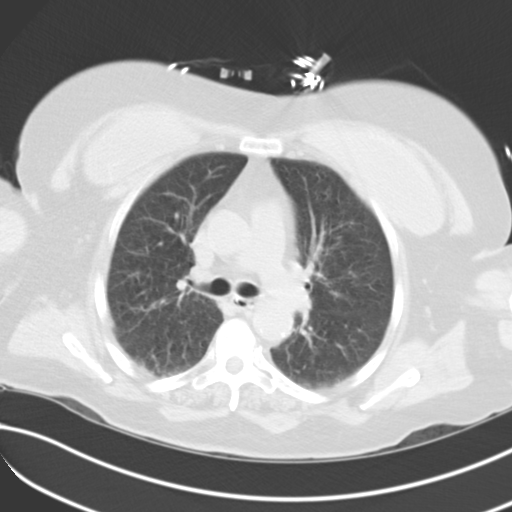
[im 107/116  lung]
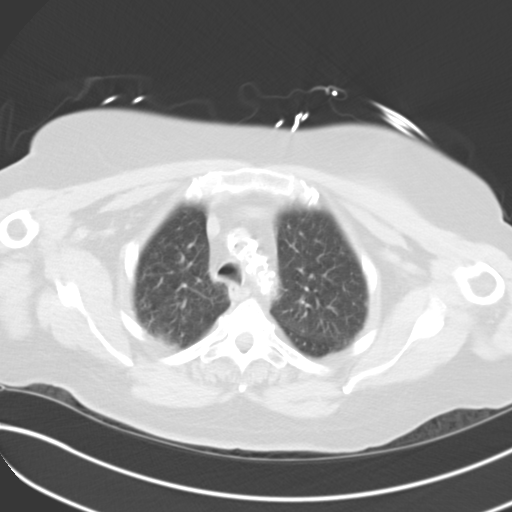

[Series 5: coronals · coronal · 0.76mm/px · 3 of 137 slices shown]
[im 28/137  lung]
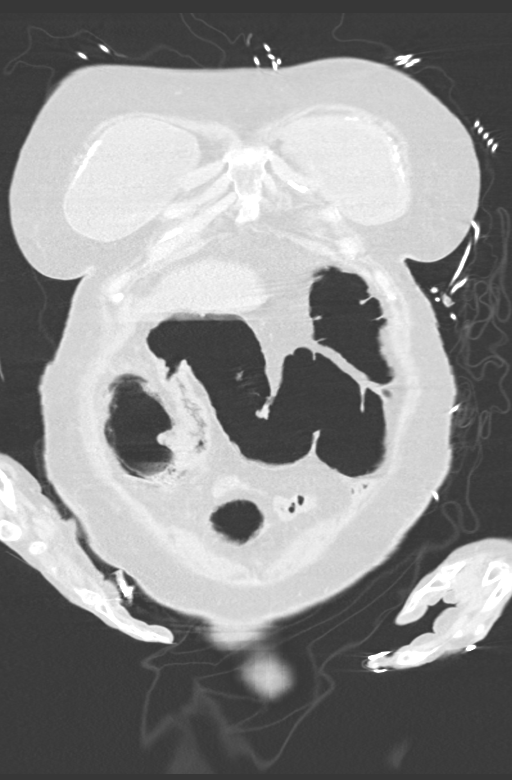
[im 55/137  lung]
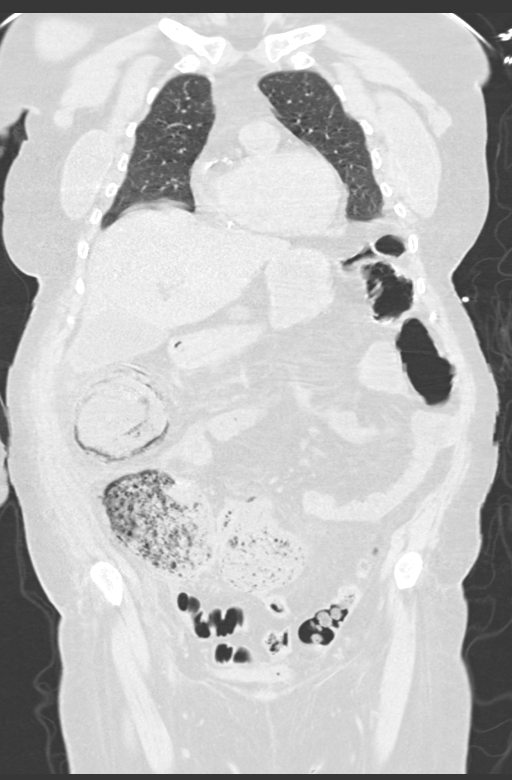
[im 82/137  lung]
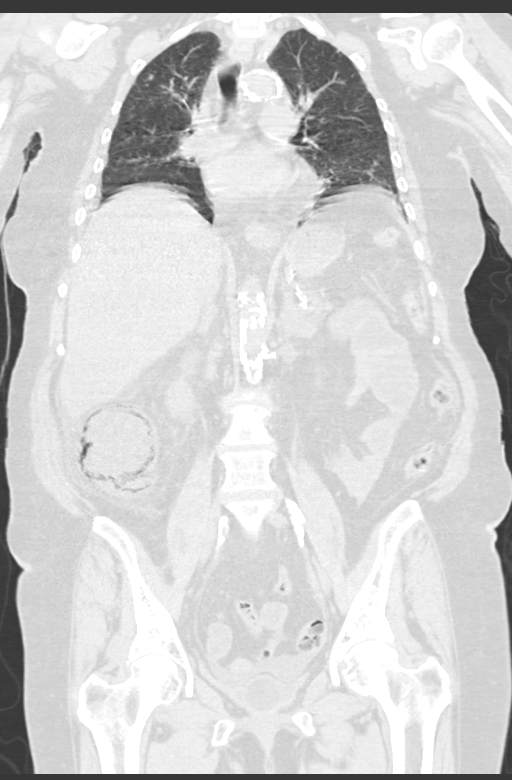

[13 of 36 positions shown; findings below may reference images not displayed]

FINDINGS: CT CHEST FINDINGS

Cardiovascular: The heart is normal in size. Scattered coronary
artery calcifications are seen. Calcification is noted at the
thoracic aorta and proximal great vessels.

Mediastinum/Nodes: The mediastinum is otherwise unremarkable in
appearance. No mediastinal lymphadenopathy is seen. No pericardial
effusion is identified. The visualized portions of the thyroid gland
are unremarkable. No axillary lymphadenopathy is seen.

Lungs/Pleura: Mild peripheral scarring is noted bilaterally. Mild
hazy bibasilar airspace opacities may reflect pneumonia. No definite
pleural effusion or pneumothorax is seen. No dominant mass is
identified.

Musculoskeletal: No acute osseous abnormalities are identified. The
visualized musculature is unremarkable in appearance. Bilateral
breast implants are noted.

CT ABDOMEN PELVIS FINDINGS

Hepatobiliary: The liver is unremarkable in appearance. The
gallbladder is mildly distended. The common bile duct remains normal
in caliber.

Pancreas: The pancreas is within normal limits.

Spleen: The spleen is unremarkable in appearance.

Adrenals/Urinary Tract: The adrenal glands are unremarkable in
appearance.

Nonspecific perinephric stranding is noted bilaterally. A
nonobstructing 6 mm stone is noted at the lower pole of the left
kidney. There is no evidence of hydronephrosis. No obstructing
ureteral stones are seen.

Stomach/Bowel: There is diffuse pneumatosis involving the cecum and
ascending colon, with distention of the ascending colon to 7.4 cm in
diameter. Diffuse surrounding soft tissue inflammation and fluid are
seen. Bowel ischemia is a concern. There is wall thickening along
the hepatic flexure of the colon, which may reflect an infectious or
inflammatory process. No definite mass was seen in [place].

The remainder of the colon is unremarkable. The small bowel is
grossly unremarkable in appearance. The stomach is unremarkable in
appearance.

Vascular/Lymphatic: Diffuse calcification is seen along the
abdominal aorta and its branches. There is likely severe luminal
narrowing at the aortic bifurcation. The inferior vena cava is
grossly unremarkable. No retroperitoneal lymphadenopathy is seen. No
pelvic sidewall lymphadenopathy is identified.

Reproductive: The bladder is decompressed, with a Foley catheter in
place. The patient is status post hysterectomy. Trace free fluid
within the pelvis may reflect the colonic process.

Other: No additional soft tissue abnormalities are seen.

Musculoskeletal: No acute osseous abnormalities are identified. The
visualized musculature is unremarkable in appearance.
IMPRESSION: 1. Diffuse pneumatosis involving the cecum and ascending colon, with
distention of the ascending colon to 7.4 cm in diameter. Diffuse
surrounding soft tissue inflammation and fluid seen. This is
concerning for bowel ischemia. Wall thickening along the hepatic
flexure of the colon may reflect an underlying infectious or
inflammatory process. No definite mass was seen in [DATE]. Mild hazy bibasilar airspace opacities may reflect pneumonia.
3. Diffuse calcification along the abdominal aorta and its branches,
with likely severe luminal narrowing at the aortic bifurcation.
4. Scattered coronary calcifications.
5. Nonobstructing 6 mm stone at the lower pole of the left kidney.
These results were called by telephone at the time of interpretation
on 10/14/2017 at [DATE] to Dr. MONCHO MARK, who verbally
acknowledged these results.

## 2019-02-15 ENCOUNTER — Encounter (HOSPITAL_COMMUNITY): Payer: Medicare Other

## 2019-02-16 ENCOUNTER — Encounter (HOSPITAL_COMMUNITY): Payer: Medicare Other

## 2019-02-16 ENCOUNTER — Ambulatory Visit: Payer: Medicare Other | Admitting: Family

## 2019-02-19 ENCOUNTER — Telehealth: Payer: Self-pay | Admitting: Gastroenterology

## 2019-02-19 NOTE — Telephone Encounter (Signed)
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  Dr Barry Dienes is her surgeon and was given that number to call.  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  She will call [facility] and make them aware.

## 2019-03-01 ENCOUNTER — Telehealth: Payer: Self-pay | Admitting: Neurology

## 2019-03-01 NOTE — Telephone Encounter (Signed)
Called the patient, no answer. Voicemail was full. If patient calls back pt needs to be scheduled to have EEG completed and then a follow up apt after that with NP.

## 2019-03-11 ENCOUNTER — Telehealth: Payer: Self-pay | Admitting: Cardiology

## 2019-03-11 NOTE — Telephone Encounter (Signed)
Called patient a second time.  VM still not set up.  I then called her son's number and left a VM stating she would need to call and reschedule her appt with Dr. Martinique.

## 2019-03-16 ENCOUNTER — Ambulatory Visit: Payer: 59 | Admitting: Cardiology

## 2019-03-22 ENCOUNTER — Telehealth (HOSPITAL_COMMUNITY): Payer: Self-pay | Admitting: *Deleted

## 2019-03-22 NOTE — Telephone Encounter (Signed)
No Voicemail box set up, will attempt later   [facility] [place]

## 2019-03-23 ENCOUNTER — Other Ambulatory Visit (HOSPITAL_COMMUNITY): Payer: 59

## 2019-04-07 ENCOUNTER — Encounter (HOSPITAL_COMMUNITY): Payer: Self-pay | Admitting: Physician Assistant

## 2019-04-19 ENCOUNTER — Other Ambulatory Visit: Payer: Self-pay | Admitting: Internal Medicine

## 2019-04-19 DIAGNOSIS — Z1231 Encounter for screening mammogram for malignant neoplasm of breast: Secondary | ICD-10-CM

## 2019-04-27 ENCOUNTER — Other Ambulatory Visit: Payer: Self-pay

## 2019-04-27 MED ORDER — LOSARTAN POTASSIUM 100 MG PO TABS: ORAL_TABLET | ORAL | 8 refills | Status: DC

## 2019-04-30 ENCOUNTER — Ambulatory Visit (HOSPITAL_COMMUNITY): Payer: 59 | Attending: Internal Medicine

## 2019-04-30 ENCOUNTER — Other Ambulatory Visit: Payer: Self-pay

## 2019-04-30 DIAGNOSIS — I2510 Atherosclerotic heart disease of native coronary artery without angina pectoris: Secondary | ICD-10-CM | POA: Diagnosis not present

## 2019-04-30 DIAGNOSIS — N183 Chronic kidney disease, stage 3 (moderate): Secondary | ICD-10-CM | POA: Diagnosis not present

## 2019-04-30 DIAGNOSIS — I059 Rheumatic mitral valve disease, unspecified: Secondary | ICD-10-CM | POA: Insufficient documentation

## 2019-04-30 DIAGNOSIS — I429 Cardiomyopathy, unspecified: Secondary | ICD-10-CM | POA: Insufficient documentation

## 2019-04-30 DIAGNOSIS — Z87891 Personal history of nicotine dependence: Secondary | ICD-10-CM | POA: Diagnosis not present

## 2019-04-30 DIAGNOSIS — E785 Hyperlipidemia, unspecified: Secondary | ICD-10-CM | POA: Insufficient documentation

## 2019-04-30 DIAGNOSIS — E1122 Type 2 diabetes mellitus with diabetic chronic kidney disease: Secondary | ICD-10-CM | POA: Insufficient documentation

## 2019-04-30 DIAGNOSIS — I129 Hypertensive chronic kidney disease with stage 1 through stage 4 chronic kidney disease, or unspecified chronic kidney disease: Secondary | ICD-10-CM | POA: Diagnosis not present

## 2019-04-30 DIAGNOSIS — G4730 Sleep apnea, unspecified: Secondary | ICD-10-CM | POA: Insufficient documentation

## 2019-05-03 ENCOUNTER — Ambulatory Visit (HOSPITAL_COMMUNITY)
Admission: RE | Admit: 2019-05-03 | Discharge: 2019-05-03 | Disposition: A | Discharge status: Home / Self Care | Payer: 59 | Source: Ambulatory Visit | Attending: Family | Admitting: Family

## 2019-05-03 ENCOUNTER — Other Ambulatory Visit: Payer: Self-pay

## 2019-05-03 ENCOUNTER — Ambulatory Visit (INDEPENDENT_AMBULATORY_CARE_PROVIDER_SITE_OTHER)
Admission: RE | Admit: 2019-05-03 | Discharge: 2019-05-03 | Disposition: A | Discharge status: Home / Self Care | Payer: 59 | Source: Ambulatory Visit | Attending: Family | Admitting: Family

## 2019-05-03 DIAGNOSIS — I739 Peripheral vascular disease, unspecified: Secondary | ICD-10-CM | POA: Insufficient documentation

## 2019-05-03 DIAGNOSIS — I6521 Occlusion and stenosis of right carotid artery: Secondary | ICD-10-CM | POA: Diagnosis not present

## 2019-05-03 DIAGNOSIS — I6522 Occlusion and stenosis of left carotid artery: Secondary | ICD-10-CM | POA: Insufficient documentation

## 2019-05-03 NOTE — Progress Notes (Signed)
Mildly decreased pumping function when compare to previous study in Jan 2019, however recent stress test was negative for ischemia. Dr. Martinique to review wall motion abnormality.

## 2019-05-04 NOTE — Progress Notes (Signed)
Reviewed echo with Dr. Martinique, the pumping function of heart was borderline low, but given reassuring stress test, would not recommend further workup

## 2019-05-08 ENCOUNTER — Other Ambulatory Visit: Payer: Self-pay | Admitting: Nurse Practitioner

## 2019-05-12 ENCOUNTER — Ambulatory Visit (INDEPENDENT_AMBULATORY_CARE_PROVIDER_SITE_OTHER): Payer: 59 | Admitting: Family

## 2019-05-12 ENCOUNTER — Other Ambulatory Visit: Payer: Self-pay

## 2019-05-12 ENCOUNTER — Encounter: Payer: Self-pay | Admitting: Family

## 2019-05-12 ENCOUNTER — Ambulatory Visit (HOSPITAL_COMMUNITY)
Admission: RE | Admit: 2019-05-12 | Discharge: 2019-05-12 | Disposition: A | Discharge status: Home / Self Care | Payer: 59 | Source: Ambulatory Visit | Attending: Family | Admitting: Family

## 2019-05-12 ENCOUNTER — Telehealth: Payer: Self-pay

## 2019-05-12 VITALS — BP 202/87 | HR 62 | Temp 98.3°F | Resp 14 | Ht 63.0 in | Wt 172.0 lb

## 2019-05-12 DIAGNOSIS — I10 Essential (primary) hypertension: Secondary | ICD-10-CM

## 2019-05-12 DIAGNOSIS — I779 Disorder of arteries and arterioles, unspecified: Secondary | ICD-10-CM

## 2019-05-12 DIAGNOSIS — I6521 Occlusion and stenosis of right carotid artery: Secondary | ICD-10-CM

## 2019-05-12 DIAGNOSIS — Z959 Presence of cardiac and vascular implant and graft, unspecified: Secondary | ICD-10-CM

## 2019-05-12 DIAGNOSIS — Z87891 Personal history of nicotine dependence: Secondary | ICD-10-CM

## 2019-05-12 DIAGNOSIS — E1151 Type 2 diabetes mellitus with diabetic peripheral angiopathy without gangrene: Secondary | ICD-10-CM

## 2019-05-12 DIAGNOSIS — I739 Peripheral vascular disease, unspecified: Secondary | ICD-10-CM | POA: Insufficient documentation

## 2019-05-12 DIAGNOSIS — I6522 Occlusion and stenosis of left carotid artery: Secondary | ICD-10-CM

## 2019-05-12 NOTE — Patient Instructions (Signed)
Stroke Prevention Some medical conditions and lifestyle choices can lead to a higher risk for a stroke. You can help to prevent a stroke by making nutrition, lifestyle, and other changes. What nutrition changes can be made?   Eat healthy foods. ? Choose foods that are high in fiber. These include:  Fresh fruits.  Fresh vegetables.  Whole grains. ? Eat at least 5 or more servings of fruits and vegetables each day. Try to fill half of your plate at each meal with fruits and vegetables. ? Choose lean protein foods. These include:  Lowfat (lean) cuts of meat.  Chicken without skin.  Fish.  Tofu.  Beans.  Nuts. ? Eat low-fat dairy products. ? Avoid foods that:  Are high in salt (sodium).  Have saturated fat.  Have trans fat.  Have cholesterol.  Are processed.  Are premade.  Follow eating guidelines as told by your doctor. These may include: ? Reducing how many calories you eat and drink each day. ? Limiting how much salt you eat or drink each day to 1,500 milligrams (mg). ? Using only healthy fats for cooking. These include:  Olive oil.  Canola oil.  Sunflower oil. ? Counting how many carbohydrates you eat and drink each day. What lifestyle changes can be made?  Try to stay at a healthy weight. Talk to your doctor about what a good weight is for you.  Get at least 30 minutes of moderate physical activity at least 5 days a week. This can include: ? Fast walking. ? Biking. ? Swimming.  Do not use any products that have nicotine or tobacco. This includes cigarettes and e-cigarettes. If you need help quitting, ask your doctor. Avoid being around tobacco smoke in general.  Limit how much alcohol you drink to no more than 1 drink a day for nonpregnant women and 2 drinks a day for men. One drink equals 12 oz of beer, 5 oz of wine, or 1 oz of hard liquor.  Do not use drugs.  Avoid taking birth control pills. Talk to your doctor about the risks of taking birth  control pills if: ? You are over 35 years old. ? You smoke. ? You get migraines. ? You have had a blood clot. What other changes can be made?  Manage your cholesterol. ? It is important to eat a healthy diet. ? If your cholesterol cannot be managed through your diet, you may also need to take medicines. Take medicines as told by your doctor.  Manage your diabetes. ? It is important to eat a healthy diet and to exercise regularly. ? If your blood sugar cannot be managed through diet and exercise, you may need to take medicines. Take medicines as told by your doctor.  Control your high blood pressure (hypertension). ? Try to keep your blood pressure below 130/80. This can help lower your risk of stroke. ? It is important to eat a healthy diet and to exercise regularly. ? If your blood pressure cannot be managed through diet and exercise, you may need to take medicines. Take medicines as told by your doctor. ? Ask your doctor if you should check your blood pressure at home. ? Have your blood pressure checked every year. Do this even if your blood pressure is normal.  Talk to your doctor about getting checked for a sleep disorder. Signs of this can include: ? Snoring a lot. ? Feeling very tired.  Take over-the-counter and prescription medicines only as told by your doctor. These may   include aspirin or blood thinners (antiplatelets or anticoagulants).  Make sure that any other medical conditions you have are managed. Where to find more information  American Stroke Association: www.strokeassociation.org  National Stroke Association: www.stroke.org Get help right away if:  You have any symptoms of stroke. "BE FAST" is an easy way to remember the main warning signs: ? B - Balance. Signs are dizziness, sudden trouble walking, or loss of balance. ? E - Eyes. Signs are trouble seeing or a sudden change in how you see. ? F - Face. Signs are sudden weakness or loss of feeling of the face,  or the face or eyelid drooping on one side. ? A - Arms. Signs are weakness or loss of feeling in an arm. This happens suddenly and usually on one side of the body. ? S - Speech. Signs are sudden trouble speaking, slurred speech, or trouble understanding what people say. ? T - Time. Time to call emergency services. Write down what time symptoms started.  You have other signs of stroke, such as: ? A sudden, very bad headache with no known cause. ? Feeling sick to your stomach (nausea). ? Throwing up (vomiting). ? Jerky movements you cannot control (seizure). These symptoms may represent a serious problem that is an emergency. Do not wait to see if the symptoms will go away. Get medical help right away. Call your local emergency services (911 in the [place] Do not drive yourself to the hospital. Summary  You can prevent a stroke by eating healthy, exercising, not smoking, drinking less alcohol, and treating other health problems, such as diabetes, high blood pressure, or high cholesterol.  Do not use any products that contain nicotine or tobacco, such as cigarettes and e-cigarettes.  Get help right away if you have any signs or symptoms of a stroke. This information is not intended to replace advice given to you by your health care provider. Make sure you discuss any questions you have with your health care provider. Document Released: 03/31/2012 Document Revised: 11/26/2018 Document Reviewed: 01/01/2017 Elsevier Patient Education  2020 [employer]     Peripheral Vascular Disease  Peripheral vascular disease (PVD) is a disease of the blood vessels that are not part of your heart and brain. A simple term for PVD is poor circulation. In most cases, PVD narrows the blood vessels that carry blood from your heart to the rest of your body. This can reduce the supply of blood to your arms, legs, and internal organs, like your stomach or kidneys. However, PVD most often affects a person's lower  legs and feet. Without treatment, PVD tends to get worse. PVD can also lead to acute ischemic limb. This is when an arm or leg suddenly cannot get enough blood. This is a medical emergency. Follow these instructions at home: Lifestyle  Do not use any products that contain nicotine or tobacco, such as cigarettes and e-cigarettes. If you need help quitting, ask your doctor.  Lose weight if you are overweight. Or, stay at a healthy weight as told by your doctor.  Eat a diet that is low in fat and cholesterol. If you need help, ask your doctor.  Exercise regularly. Ask your doctor for activities that are right for you. General instructions  Take over-the-counter and prescription medicines only as told by your doctor.  Take good care of your feet: ? Wear comfortable shoes that fit well. ? Check your feet often for any cuts or sores.  Keep all follow-up visits as told   by your doctor This is important. Contact a doctor if:  You have cramps in your legs when you walk.  You have leg pain when you are at rest.  You have coldness in a leg or foot.  Your skin changes.  You are unable to get or have an erection (erectile dysfunction).  You have cuts or sores on your feet that do not heal. Get help right away if:  Your arm or leg turns cold, numb, and blue.  Your arms or legs become red, warm, swollen, painful, or numb.  You have chest pain.  You have trouble breathing.  You suddenly have weakness in your face, arm, or leg.  You become very confused or you cannot speak.  You suddenly have a very bad headache.  You suddenly cannot see. Summary  Peripheral vascular disease (PVD) is a disease of the blood vessels.  A simple term for PVD is poor circulation. Without treatment, PVD tends to get worse.  Treatment may include exercise, low fat and low cholesterol diet, and quitting smoking. This information is not intended to replace advice given to you by your health care  provider. Make sure you discuss any questions you have with your health care provider. Document Released: 12/25/2009 Document Revised: 09/12/2017 Document Reviewed: 11/07/2016 Elsevier Patient Education  2020 [employer] Inc.  

## 2019-05-12 NOTE — Telephone Encounter (Addendum)
Left a detailed message for the patient to give the office a call back for her ECHO results.  ----- Message from [place], [place] sent at 05/04/2019  1:58 PM EDT ----- Reviewed echo with Dr. Martinique, the pumping function of heart was borderline low, but given reassuring stress test, would not recommend further workup

## 2019-05-12 NOTE — Progress Notes (Signed)
[facility] [place] HISTORY AND PHYSICAL   CC: Follow up extracranial carotid artery stenosis and peripheral artery occlusive disease     History of Present Illness:   [name] is a 73 y.o. female who is status post a left SFA stent May of 2011 by Dr. Kellie Simmering, she also has a known left ICA occlusion.  She denies claudication symptoms with walking, denies non healing wounds. She denies any known history of stroke or TIA.   She had small bowel ischemia, admitted for abdominal pain in January 2019, had small bowel resection, has an ileostomy. She states she had a stroke during that hospitalization, but denies any residual neurological deficit. She states her tongue developed a crease and has some different speech from this.   She lost about 40 pounds, she no longer needs DM medications.   She takes amlodipine, losartan, and metoprolol for hypertension, states she took her AM meds.   She was seen in the ED for new onset seizures in January 2020.   She states she has had trouble sleeping on and off for a long time, and more so lately. She reports a slight headache.  She states she does not check her blood pressure at home.  She denies chest pain or dyspnea.   Diabetic: Yes, last A1C result on file was 6.6 on 10-25-17 Tobacco use: former smoker, quit in January 2019 (was 1 ppd, started at age 58 yrs)  Pt meds include: Statin :no Betablocker: Yes ASA: No, pt states she was advised by Dr. Martinique to take a daily 81 mg ASA, she states she feels she does not need it as she takes so man other medications, and is not taking. She denies any bleeding issues, denies any allergy to ASA.  Other anticoagulants/antiplatelets: no   Current Outpatient Medications  Medication Sig Dispense Refill  . acetaminophen (TYLENOL) 325 MG tablet Take 650 mg by mouth every 6 (six) hours as needed for headache (pain).    Marland Kitchen amLODipine (NORVASC) 10 MG tablet Place 1 tablet (10 mg  total) into feeding tube daily. (Patient taking differently: Take 10 mg by mouth daily. ) 30 tablet 0  . ARIPiprazole (ABILIFY) 5 MG tablet Place 1 tablet (5 mg total) into feeding tube daily. (Patient taking differently: Take 5 mg by mouth daily. ) 30 tablet 0  . atorvastatin (LIPITOR) 20 MG tablet Take 20 mg by mouth daily.    . clopidogrel (PLAVIX) 75 MG tablet Take 75 mg by mouth daily.    . diphenoxylate-atropine (LOMOTIL) 2.5-0.025 MG tablet Take 1 tablet by mouth 2 (two) times daily. 60 tablet 2  . esomeprazole (NEXIUM) 40 MG capsule Take 40 mg by mouth daily.    . hydrOXYzine (ATARAX/VISTARIL) 25 MG tablet Take 25 mg by mouth at bedtime as needed (sleep).    Marland Kitchen levETIRAcetam (KEPPRA) 500 MG tablet Take 1 tablet (500 mg total) by mouth 2 (two) times daily. 60 tablet 3  . loperamide (IMODIUM) 2 MG capsule Take 2 mg by mouth 3 (three) times daily.    Marland Kitchen losartan (COZAAR) 100 MG tablet Take 1/2 tablet ( 50 mg ) daily 45 tablet 8  . metoprolol succinate (TOPROL-XL) 100 MG 24 hr tablet Take 100 mg by mouth daily. Take with or immediately following a meal.    . mirtazapine (REMERON) 15 MG tablet Take 15 mg by mouth at bedtime.    . montelukast (SINGULAIR) 10 MG tablet Take 10 mg by mouth daily.    Marland Kitchen  nitroGLYCERIN (NITROSTAT) 0.4 MG SL tablet Place 1 tablet (0.4 mg total) under the tongue every 5 (five) minutes as needed for chest pain. 90 tablet 3  . Pancrelipase, Lip-Prot-Amyl, (ZENPEP) 40000-126000 units CPEP Take 1 capsule by mouth 3 (three) times daily with meals.    . pantoprazole (PROTONIX) 40 MG tablet Take 40 mg by mouth daily.    . Psyllium ([employer] NATURAL DAILY FIBER PO) Take 15 mLs by mouth See admin instructions. Mix one tablespoonful (15 mls) in water and drink by mouth twice daily    . triamcinolone cream (KENALOG) 0.1 % Apply 1 application topically 2 (two) times daily as needed (rash/itching).    . venlafaxine XR (EFFEXOR-XR) 150 MG 24 hr capsule Take 150 mg by mouth daily.    .  vitamin B-12 (CYANOCOBALAMIN) 500 MCG tablet Take 500 mcg by mouth daily.    . Vitamin D, Ergocalciferol, (DRISDOL) 1.25 MG (50000 UT) CAPS capsule Take 50,000 Units by mouth every Wednesday.      No current facility-administered medications for this visit.     Past Medical History:  Diagnosis Date  . Anxiety   . Arthropathy, unspecified, site unspecified   . Blind loop syndrome   . Carotid artery disease ([place])    documented occlusion of left ICA  . Coronary artery disease    a. known 60-70% mid-LAD stenosis with caths in 1999, 2002, 2006, and 2012 showing stable anatomy b. low-risk NST in 10/2015  . Depression   . Diabetes insipidus ([place])   . Diabetes mellitus   . Diverticulosis   . Dizziness    chronic  . Dyslipidemia   . Dyspnea    with exertion  . Fatty liver   . GERD (gastroesophageal reflux disease)    hiatial hernia  . Hepatomegaly   . Hiatal hernia   . HTN (hypertension)   . Hyperlipidemia   . Hypertension   . Irritable bowel syndrome   . Melanosis coli   . Metatarsal fracture right  . Normal nuclear stress test    nml 01/03/10;06/19/07  . Other, mixed, or unspecified nondependent drug abuse, unspecified   . Pancreatitis, chronic ([place])   . Peripheral vascular disease ([place]) 02/20/10   s/p stent of left SFA  . Seizure ([place]) 11/04/2018  . Sleep apnea   . Stroke ([place])   . Thyroid nodule   . Unspecified essential hypertension   . Vitamin B12 deficiency     Social History Social History   Tobacco Use  . Smoking status: Former Smoker    Packs/day: 1.00    Years: 50.00    Pack years: 50.00    Types: Cigarettes    Quit date: 10/14/2010    Years since quitting: 8.5  . Smokeless tobacco: Never Used  Substance Use Topics  . Alcohol use: No  . Drug use: No    Family History Family History  Problem Relation Age of Onset  . Hypertension Mother   . Osteoarthritis Mother   . Pulmonary embolism Mother   . Hypertension Father        aneurysm  . Stroke Father    . Stroke Other   . Colon cancer Paternal Grandfather     Surgical History Past Surgical History:  Procedure Laterality Date  . ABDOMINAL HYSTERECTOMY     partial  . AUGMENTATION MAMMAPLASTY     bilateral 1976 retro   . BREAST ENHANCEMENT SURGERY    . CARDIAC CATHETERIZATION  07/07/98;08/31/01;03/04/05   60 -70% LAD  . CATARACT  EXTRACTION    . COLON RESECTION N/A 10/14/2017   Procedure: EXPLORATORY LAPAROTOMY, EXTENDED RIGHT COLECTOMY; APPLICATION OF ABDOMINAL VACUUM DRESSING;  Surgeon: Jovita Kussmaul, MD;  Location: [facility];  Service: General;  Laterality: N/A;  . ENDOBRONCHIAL ULTRASOUND Bilateral 02/24/2017   Procedure: ENDOBRONCHIAL ULTRASOUND;  Surgeon: Collene Gobble, MD;  Location: [facility];  Service: Cardiopulmonary;  Laterality: Bilateral;  . EYE SURGERY Left Nov. 2016   Cataract  . EYE SURGERY Right 2001   Cataract  . FEMORAL ARTERY STENT     Left leg  . LAPAROTOMY N/A 10/16/2017   Procedure: EXPLORATORY LAPAROTOMY, PARTIAL OMENTECTOMY, RESECTION ISCHEMIC ILEUM 454UJ, APPLICATION OF VAC ABDOMINAL DRESSING;  Surgeon: Armandina Gemma, MD;  Location: [facility];  Service: General;  Laterality: N/A;  . LAPAROTOMY N/A 10/18/2017   Procedure: RE-EXPLORATION OF ABDOMEN, ILEOSTOMY CREATION;  Surgeon: Stark Klein, MD;  Location: [facility];  Service: General;  Laterality: N/A;  . LUNG SURGERY  03/2017   Benign polyps removed  . PATELLA REALIGNMENT Left   . TONSILLECTOMY    . TOTAL ABDOMINAL HYSTERECTOMY    . WRIST SURGERY     right    Allergies  Allergen Reactions  . Adhesive [Tape] Other (See Comments)    "Took off my skin"  . Codeine Other (See Comments)    Altered mental state   . Iodinated Diagnostic Agents Rash    Current Outpatient Medications  Medication Sig Dispense Refill  . acetaminophen (TYLENOL) 325 MG tablet Take 650 mg by mouth every 6 (six) hours as needed for headache (pain).    Marland Kitchen amLODipine (NORVASC) 10 MG tablet Place 1 tablet (10 mg total) into feeding tube  daily. (Patient taking differently: Take 10 mg by mouth daily. ) 30 tablet 0  . ARIPiprazole (ABILIFY) 5 MG tablet Place 1 tablet (5 mg total) into feeding tube daily. (Patient taking differently: Take 5 mg by mouth daily. ) 30 tablet 0  . atorvastatin (LIPITOR) 20 MG tablet Take 20 mg by mouth daily.    . clopidogrel (PLAVIX) 75 MG tablet Take 75 mg by mouth daily.    . diphenoxylate-atropine (LOMOTIL) 2.5-0.025 MG tablet Take 1 tablet by mouth 2 (two) times daily. 60 tablet 2  . esomeprazole (NEXIUM) 40 MG capsule Take 40 mg by mouth daily.    . hydrOXYzine (ATARAX/VISTARIL) 25 MG tablet Take 25 mg by mouth at bedtime as needed (sleep).    Marland Kitchen levETIRAcetam (KEPPRA) 500 MG tablet Take 1 tablet (500 mg total) by mouth 2 (two) times daily. 60 tablet 3  . loperamide (IMODIUM) 2 MG capsule Take 2 mg by mouth 3 (three) times daily.    Marland Kitchen losartan (COZAAR) 100 MG tablet Take 1/2 tablet ( 50 mg ) daily 45 tablet 8  . metoprolol succinate (TOPROL-XL) 100 MG 24 hr tablet Take 100 mg by mouth daily. Take with or immediately following a meal.    . mirtazapine (REMERON) 15 MG tablet Take 15 mg by mouth at bedtime.    . montelukast (SINGULAIR) 10 MG tablet Take 10 mg by mouth daily.    . nitroGLYCERIN (NITROSTAT) 0.4 MG SL tablet Place 1 tablet (0.4 mg total) under the tongue every 5 (five) minutes as needed for chest pain. 90 tablet 3  . Pancrelipase, Lip-Prot-Amyl, (ZENPEP) 40000-126000 units CPEP Take 1 capsule by mouth 3 (three) times daily with meals.    . pantoprazole (PROTONIX) 40 MG tablet Take 40 mg by mouth daily.    . Psyllium ([employer] NATURAL DAILY  FIBER PO) Take 15 mLs by mouth See admin instructions. Mix one tablespoonful (15 mls) in water and drink by mouth twice daily    . triamcinolone cream (KENALOG) 0.1 % Apply 1 application topically 2 (two) times daily as needed (rash/itching).    . venlafaxine XR (EFFEXOR-XR) 150 MG 24 hr capsule Take 150 mg by mouth daily.    . vitamin B-12 (CYANOCOBALAMIN)  500 MCG tablet Take 500 mcg by mouth daily.    . Vitamin D, Ergocalciferol, (DRISDOL) 1.25 MG (50000 UT) CAPS capsule Take 50,000 Units by mouth every Wednesday.      No current facility-administered medications for this visit.      REVIEW OF SYSTEMS: See HPI for pertinent positives and negatives.  Physical Examination Vitals:   05/12/19 1429 05/12/19 1435 05/12/19 1437  BP: (!) 190/92 (!) 206/82 (!) 202/87  Pulse: 62 62   Resp: 14    Temp: 98.3 F (36.8 C)    TempSrc: Temporal    SpO2: 98%    Weight: 172 lb (78 kg)    Height: 5\' 3"  (1.6 m)     Body mass index is 30.47 kg/m.  General:  WDWN obese female in NAD Gait: Normal HENT: WNL Eyes: Pupils equal Pulmonary: normal non-labored breathing, good air movement in all fields, CTAB, no rales, rhonchi, or wheezing Cardiac: RRR, no murmur detected Abdomen: soft, NT, no masses palpated. Ileostomy with bag appliance at RLQ.  Skin: no rashes, no ulcers, no cellulitis.   VASCULAR EXAM  Carotid Bruits Right Left   Negative Negative      Radial pulses are palpable bilaterally   Adominal aortic pulse is not palpable                      VASCULAR EXAM: Extremities without ischemic changes, without Gangrene; without open wounds.                                                                                                          LE Pulses Right Left       POPLITEAL  not palpable   not palpable       POSTERIOR TIBIAL  not palpable   not palpable        DORSALIS PEDIS      ANTERIOR TIBIAL not palpable  not palpable    Musculoskeletal: no muscle wasting or atrophy; no peripheral edema  Neurologic:  A&O X 3; appropriate affect, sensation is normal; speech is normal, CN 2-12 intact, pain and light touch intact in extremities, motor exam as listed above. Psychiatric: Normal thought content, mood appropriate to clinical situation.    DATA  Carotid Duplex (05-03-19): Right Carotid: Velocities in the right ICA are  consistent with a 1-39% stenosis. Left Carotid: Evidence consistent with a total occlusion of the left ICA.               Hemodynamically significant plaque >50% visualized in the CCA. Vertebrals:  Bilateral vertebral arteries demonstrate antegrade flow. Subclavians: Normal flow hemodynamics were seen in bilateral subclavian arteries.  Less stenosis noted in the right ICA compared to the exam 08-13-18. Left ICA remains occluded.    Left LE Arterial Duplex (05-12-19): LEFT       PSV cm/sRatioStenosisWaveform  Comments  +-----------+--------+-----+--------+----------+---------+ CFA Distal 128                  triphasic           +-----------+--------+-----+--------+----------+---------+ DFA        121                  monophasic          +-----------+--------+-----+--------+----------+---------+ SFA Prox                                  see stent +-----------+--------+-----+--------+----------+---------+ SFA Mid                                   see stent +-----------+--------+-----+--------+----------+---------+ SFA Distal 111                  biphasic            +-----------+--------+-----+--------+----------+---------+ POP Prox   60                   biphasic            +-----------+--------+-----+--------+----------+---------+ POP Distal 77                   biphasic            +-----------+--------+-----+--------+----------+---------+ ATA Distal 86                   biphasic            +-----------+--------+-----+--------+----------+---------+ TA Distal 91                   biphasic            +-----------+--------+-----+--------+----------+---------+ PERO Distal40                   monophasic          +-----------+--------+-----+--------+----------+---------+ Left Stent(s): +---------------+---++--------++ Prox to Stent  99 biphasic +---------------+---++--------++ Proximal Stent  104biphasic +---------------+---++--------++ Mid Stent      89 biphasic +---------------+---++--------++ Distal Stent   85 biphasic +---------------+---++--------++ Distal to Stent109biphasic +---------------+---++--------++ Summary: Patent stent with no evidence of stenosis in the left superficial femoral artery.  No significant change compared to the exam on 08-27-18.    ABI Findings (05-03-19): +---------+------------------+-----+--------+--------+ Right    Rt Pressure (mmHg)IndexWaveformComment  +---------+------------------+-----+--------+--------+ Brachial 150                                     +---------+------------------+-----+--------+--------+ PTA      144               0.96 biphasic         +---------+------------------+-----+--------+--------+ DP       136               0.91 biphasic         +---------+------------------+-----+--------+--------+ Great Toe107               0.71                  +---------+------------------+-----+--------+--------+  +---------+------------------+-----+--------+-------+  Left     Lt Pressure (mmHg)IndexWaveformComment +---------+------------------+-----+--------+-------+ Brachial 147                                    +---------+------------------+-----+--------+-------+ PTA      137               0.91 biphasic        +---------+------------------+-----+--------+-------+ DP       136               0.91 biphasic        +---------+------------------+-----+--------+-------+ Great Toe115               0.77                 +---------+------------------+-----+--------+-------+  +-------+-----------+-----------+------------+------------+ ABI/TBIToday's ABIToday's TBIPrevious ABIPrevious TBI +-------+-----------+-----------+------------+------------+ Right  0.96       0.71       0.90        0.81          +-------+-----------+-----------+------------+------------+ Left   0.91       0.77       0.79        0.70         +-------+-----------+-----------+------------+------------+  Bilateral ABIs appear increased compared to prior study on 08/13/2018. Summary: Right: Resting right ankle-brachial index is within normal range. No evidence of significant right lower extremity arterial disease. The right toe-brachial index is normal. Left: Resting left ankle-brachial index indicates mild left lower extremity arterial disease. The left toe-brachial index is normal. Improvement in bilateral ABI.     CT head in 2000: no evidence of stroke   ASSESSMENT:  STACIE TEMPLIN is a 73 y.o. female who is status post a left SFA stent placement May of 2011, she also has a known left ICA occlusion.  She had a stroke during hospitalization in January 2019 for bowel ischemia and small bowel resection, but denies any residual neurological deficit. She states her tongue developed a crease and has some different speech from this.   Carotid duplex on 05-03-19 shows less stenosis noted in the right ICA compared to the exam 08-13-18. Left ICA remains occluded.  ABI's on 05-03-19 have improved bilaterally to normal in the right, mild  Disease in the left, all biphasic waveforms.  Left LE Arterial duplex today shows a patent stent with no evidence of stenosis in the left superficial femoral artery.  No significant change compared to the exam on 08-27-18.  She has no claudication symptoms with walking, no signs of ischemia in her feet/legs.  Her atherosclerotic risk factors include former smoker from age 28 to age 48, controlled DM,and CAD. See Plan.   She is not walking other than in her daily activities. She did fall once, while in rehab after her stroke, not since then. Has stumbled a few times since rehab.    Her blood pressure is elevated, 3 readings obtained, she took her metoprolol, amlodipine, and  losartan this AM. She has a mild headache, denies chest pain or dyspnea. She states she has not been sleeping well; she is obese and has a large neck; consider OSA studies if not perfromed recently.  I spoke with DJ, a nurse in Dr. Buel Ream office, to let his office know that I am sending her there (across the street) for asymptomatic uncontrolled hypertension.    PLAN:   Graduated walking program with her walker discussed. Daily seated  leg exercises demonstrated and discussed.   Based on today's exam and non-invasive vascular lab results, the patient will follow up in 1 year with the following tests: carotid duplex, ABI's, and left LE arterial duplex.  I discussed in depth with the patient the nature of atherosclerosis, and emphasized the importance of maximal medical management including strict control of blood pressure, blood glucose, and lipid levels, obtaining regular exercise, and cessation of smoking.  The patient is aware that without maximal medical management the underlying atherosclerotic disease process will progress, limiting the benefit of any interventions.  The patient was given information about stroke prevention and what symptoms should prompt the patient to seek immediate medical care.  The patient was given information about PAD including signs, symptoms, treatment, what symptoms should prompt the patient to seek immediate medical care, and risk reduction measures to take.  Thank you for allowing [place] to participate in this patient's care.  Clemon Chambers, RN, MSN, FNP-C [facility] Office: [phone]  Clinic MD: Laqueta Due 05/12/2019 2:41 PM

## 2019-05-17 ENCOUNTER — Telehealth: Payer: Self-pay

## 2019-05-17 NOTE — Telephone Encounter (Addendum)
Left a voice message for the patient to call back for ECHO results.  ----- Message from [place], [place] sent at 05/04/2019  1:58 PM EDT ----- Reviewed echo with Dr. Martinique, the pumping function of heart was borderline low, but given reassuring stress test, would not recommend further workup

## 2019-05-21 ENCOUNTER — Other Ambulatory Visit: Payer: Self-pay | Admitting: Neurology

## 2019-05-21 NOTE — Progress Notes (Signed)
The patient has been notified of the result and verbalized understanding.  All questions (if any) were answered. Jacqulynn Cadet, [place] 05/21/2019 4:45 PM

## 2019-05-31 ENCOUNTER — Ambulatory Visit
Admission: RE | Admit: 2019-05-31 | Discharge: 2019-05-31 | Disposition: A | Discharge status: Home / Self Care | Payer: 59 | Source: Ambulatory Visit | Attending: Internal Medicine | Admitting: Internal Medicine

## 2019-05-31 ENCOUNTER — Other Ambulatory Visit: Payer: Self-pay

## 2019-05-31 DIAGNOSIS — Z1231 Encounter for screening mammogram for malignant neoplasm of breast: Secondary | ICD-10-CM

## 2019-06-03 DIAGNOSIS — R252 Cramp and spasm: Secondary | ICD-10-CM | POA: Insufficient documentation

## 2019-06-20 ENCOUNTER — Emergency Department (HOSPITAL_COMMUNITY)
Admission: EM | Admit: 2019-06-20 | Discharge: 2019-06-20 | Disposition: A | Discharge status: Home / Self Care | Payer: 59 | Attending: Emergency Medicine | Admitting: Emergency Medicine

## 2019-06-20 ENCOUNTER — Emergency Department (HOSPITAL_COMMUNITY): Payer: 59

## 2019-06-20 ENCOUNTER — Other Ambulatory Visit: Payer: Self-pay

## 2019-06-20 DIAGNOSIS — I959 Hypotension, unspecified: Secondary | ICD-10-CM | POA: Diagnosis not present

## 2019-06-20 DIAGNOSIS — I2510 Atherosclerotic heart disease of native coronary artery without angina pectoris: Secondary | ICD-10-CM | POA: Insufficient documentation

## 2019-06-20 DIAGNOSIS — H538 Other visual disturbances: Secondary | ICD-10-CM | POA: Diagnosis not present

## 2019-06-20 DIAGNOSIS — I129 Hypertensive chronic kidney disease with stage 1 through stage 4 chronic kidney disease, or unspecified chronic kidney disease: Secondary | ICD-10-CM | POA: Diagnosis not present

## 2019-06-20 DIAGNOSIS — E860 Dehydration: Secondary | ICD-10-CM | POA: Diagnosis not present

## 2019-06-20 DIAGNOSIS — R109 Unspecified abdominal pain: Secondary | ICD-10-CM | POA: Diagnosis not present

## 2019-06-20 DIAGNOSIS — Z79899 Other long term (current) drug therapy: Secondary | ICD-10-CM | POA: Insufficient documentation

## 2019-06-20 DIAGNOSIS — I951 Orthostatic hypotension: Secondary | ICD-10-CM | POA: Diagnosis not present

## 2019-06-20 DIAGNOSIS — J449 Chronic obstructive pulmonary disease, unspecified: Secondary | ICD-10-CM | POA: Diagnosis not present

## 2019-06-20 DIAGNOSIS — Z87891 Personal history of nicotine dependence: Secondary | ICD-10-CM | POA: Insufficient documentation

## 2019-06-20 DIAGNOSIS — Z7902 Long term (current) use of antithrombotics/antiplatelets: Secondary | ICD-10-CM | POA: Insufficient documentation

## 2019-06-20 DIAGNOSIS — Z8673 Personal history of transient ischemic attack (TIA), and cerebral infarction without residual deficits: Secondary | ICD-10-CM | POA: Insufficient documentation

## 2019-06-20 DIAGNOSIS — N183 Chronic kidney disease, stage 3 (moderate): Secondary | ICD-10-CM | POA: Insufficient documentation

## 2019-06-20 DIAGNOSIS — R51 Headache: Secondary | ICD-10-CM | POA: Diagnosis not present

## 2019-06-20 DIAGNOSIS — E1122 Type 2 diabetes mellitus with diabetic chronic kidney disease: Secondary | ICD-10-CM | POA: Insufficient documentation

## 2019-06-20 DIAGNOSIS — N179 Acute kidney failure, unspecified: Secondary | ICD-10-CM | POA: Insufficient documentation

## 2019-06-20 DIAGNOSIS — R52 Pain, unspecified: Secondary | ICD-10-CM | POA: Diagnosis not present

## 2019-06-20 DIAGNOSIS — R42 Dizziness and giddiness: Secondary | ICD-10-CM | POA: Diagnosis not present

## 2019-06-20 LAB — URINALYSIS, ROUTINE W REFLEX MICROSCOPIC
Bilirubin Urine: NEGATIVE
Glucose, UA: NEGATIVE mg/dL
Hgb urine dipstick: NEGATIVE
Ketones, ur: NEGATIVE mg/dL
Nitrite: NEGATIVE
Protein, ur: NEGATIVE mg/dL
Specific Gravity, Urine: 1.008 (ref 1.005–1.030)
pH: 5 (ref 5.0–8.0)

## 2019-06-20 LAB — BASIC METABOLIC PANEL
Anion gap: 9 (ref 5–15)
BUN: 47 mg/dL — ABNORMAL HIGH (ref 8–23)
CO2: 14 mmol/L — ABNORMAL LOW (ref 22–32)
Calcium: 8.4 mg/dL — ABNORMAL LOW (ref 8.9–10.3)
Chloride: 104 mmol/L (ref 98–111)
Creatinine, Ser: 2.56 mg/dL — ABNORMAL HIGH (ref 0.44–1.00)
GFR calc Af Amer: 21 mL/min — ABNORMAL LOW (ref >60)
GFR calc non Af Amer: 18 mL/min — ABNORMAL LOW (ref >60)
Glucose, Bld: 164 mg/dL — ABNORMAL HIGH (ref 70–99)
Potassium: 4.7 mmol/L (ref 3.5–5.1)
Sodium: 127 mmol/L — ABNORMAL LOW (ref 135–145)

## 2019-06-20 LAB — HEPATIC FUNCTION PANEL
ALT: 18 U/L (ref 0–44)
AST: 24 U/L (ref 15–41)
Albumin: 3 g/dL — ABNORMAL LOW (ref 3.5–5.0)
Alkaline Phosphatase: 86 U/L (ref 38–126)
Bilirubin, Direct: <0.1 mg/dL (ref 0.0–0.2)
Total Bilirubin: 0.6 mg/dL (ref 0.3–1.2)
Total Protein: 6.1 g/dL — ABNORMAL LOW (ref 6.5–8.1)

## 2019-06-20 LAB — CBC
HCT: 32.5 % — ABNORMAL LOW (ref 36.0–46.0)
Hemoglobin: 10.6 g/dL — ABNORMAL LOW (ref 12.0–15.0)
MCH: 30 pg (ref 26.0–34.0)
MCHC: 32.6 g/dL (ref 30.0–36.0)
MCV: 92.1 fL (ref 80.0–100.0)
Platelets: 268 10*3/uL (ref 150–400)
RBC: 3.53 MIL/uL — ABNORMAL LOW (ref 3.87–5.11)
RDW: 13.4 % (ref 11.5–15.5)
WBC: 11.1 10*3/uL — ABNORMAL HIGH (ref 4.0–10.5)
nRBC: 0 % (ref 0.0–0.2)

## 2019-06-20 LAB — LIPASE, BLOOD: Lipase: 28 U/L (ref 11–51)

## 2019-06-20 LAB — TROPONIN I (HIGH SENSITIVITY): Troponin I (High Sensitivity): 7 ng/L (ref <18)

## 2019-06-20 MED ORDER — SODIUM CHLORIDE 0.9 % IV BOLUS
1000.0000 mL | Freq: Once | INTRAVENOUS | Status: AC
  Administered 2019-06-20: 17:00:00 1000 mL via INTRAVENOUS

## 2019-06-20 MED ORDER — SODIUM CHLORIDE 0.9 % IV BOLUS
1000.0000 mL | Freq: Once | INTRAVENOUS | Status: AC
  Administered 2019-06-20: 20:00:00 1000 mL via INTRAVENOUS

## 2019-06-20 NOTE — ED Provider Notes (Signed)
[facility] EMERGENCY DEPARTMENT Provider Note   CSN: [id number] Arrival date & time: 06/20/19  1446     History   Chief Complaint Chief Complaint  Patient presents with  . Dizziness    HPI [name] is a 73 y.o. female history of CAD, diabetes, blind loop syndrome status post colostomy, who presented with headache, dizziness, blurry vision, near syncope.  Patient states that she has been having loose stools for the last several weeks she states that she had poor appetite. She was at [employer] earlier today and felt lightheaded and dizzy and almost passed out.  She has some blurry vision and headache at that time.  She was noted to be hypotensive with a blood pressures in the 90s at that time.  Denies any chest pain or shortness of breath.     The history is provided by the patient.    Past Medical History:  Diagnosis Date  . Anxiety   . Arthropathy, unspecified, site unspecified   . Blind loop syndrome   . Carotid artery disease ([place])    documented occlusion of left ICA  . Coronary artery disease    a. known 60-70% mid-LAD stenosis with caths in 1999, 2002, 2006, and 2012 showing stable anatomy b. low-risk NST in 10/2015  . Depression   . Diabetes insipidus ([place])   . Diabetes mellitus   . Diverticulosis   . Dizziness    chronic  . Dyslipidemia   . Dyspnea    with exertion  . Fatty liver   . GERD (gastroesophageal reflux disease)    hiatial hernia  . Hepatomegaly   . Hiatal hernia   . HTN (hypertension)   . Hyperlipidemia   . Hypertension   . Irritable bowel syndrome   . Melanosis coli   . Metatarsal fracture right  . Normal nuclear stress test    nml 01/03/10;06/19/07  . Other, mixed, or unspecified nondependent drug abuse, unspecified   . Pancreatitis, chronic ([place])   . Peripheral vascular disease ([place]) 02/20/10   s/p stent of left SFA  . Seizure ([place]) 11/04/2018  . Sleep apnea   . Stroke ([place])   . Thyroid nodule   . Unspecified  essential hypertension   . Vitamin B12 deficiency     Patient Active Problem List   Diagnosis Date Noted  . Seizures ([place]) 01/07/2019  . Cerebrovascular accident (CVA) due to occlusion of left carotid artery ([place]) 01/07/2019  . CAD, multiple vessel 01/07/2019  . H/O ischemic bowel disease 01/07/2019  . Acute bronchitis 12/04/2018  . Oral phase dysphagia 12/04/2018  . CKD (chronic kidney disease) stage 3, GFR 30-59 ml/min (HCC) 11/04/2018  . Seizure ([place]) 11/03/2018  . 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 Scar of forehead 01/06/2018  . Traumatic ulceration of tongue 12/04/2017  . Ileostomy in place [facility]) 10/23/2017  . Pressure injury of skin 10/19/2017  . 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  Ileostomy 10/18/2017 10/14/2017  . Ischemic colitis ([place]) 10/14/2017  . Acute respiratory failure with hypoxemia ([place]) 10/14/2017  . Acute post-operative pain 10/14/2017  . AKI (acute kidney injury) ([place]) 10/14/2017  . Anemia 10/14/2017  . Nocturia more than twice per night 05/07/2017  . Diabetes due to underlying condition w oth circulatory comp ([place]) 05/07/2017  . Atherosclerotic PVD with intermittent claudication ([place]) 05/07/2017  . Poor sleep hygiene 05/07/2017  . 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 Hilar lymphadenopathy   . Hemoptysis 02/19/2017  . Pulmonary nodules/lesions, multiple 02/19/2017  .  Tobacco use 02/19/2017  . RUQ abdominal pain 01/13/2017  . Left-sided extracranial carotid artery occlusion 11/26/2016  . PAOD (peripheral arterial occlusive disease) ([place]) 11/26/2016  . Encounter for routine follow-up after surgery of the circulatory system 11/26/2016  . Chest pain with moderate risk of acute coronary syndrome 10/27/2015  . Acid indigestion 12/31/2013  . Pain in left hip 08/26/2013  . Screening for gout 05/13/2013  . Disorder of skin or subcutaneous tissue 12/22/2012  . Difficult or painful urination 09/02/2012  . Peripheral blood vessel disorder ([place])  05/11/2012  . 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 Follow-up examination, following unspecified surgery 05/11/2012  . Occlusion and stenosis of carotid artery without mention of cerebral infarction 04/14/2012  . Fatigue 09/24/2011  . Gonalgia 07/04/2011  . Avitaminosis D 05/02/2011  . Chronic pancreatitis ([place]) 04/30/2011  . Vitamin B12 deficiency 04/30/2011  . 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 Fatty liver 04/30/2011  . Adiposity 04/30/2011  . Other and unspecified general psychiatric examination 04/26/2011  . Abdominal pain 04/09/2011  . Iron deficiency anemia, unspecified  04/09/2011  . Other general symptoms  04/09/2011  . DM (diabetes mellitus screen) 04/09/2011  . Asthma 04/09/2011  . Type 2 diabetes mellitus with diabetic peripheral angiopathy without gangrene ([place]) 01/24/2011  . Abnormal weight gain 11/30/2010  . Dyslipidemia   . Dizziness   . Right-sided extracranial carotid artery stenosis   . Normal nuclear stress test   . Feeling bilious 07/11/2010  . Underimmunization status 07/11/2010  . Pancreas (digestive gland) works poorly 05/30/2010  . B12 deficiency 03/19/2010  . DVT 03/16/2010  . BLIND LOOP SYNDROME 03/16/2010  . DIARRHEA 03/16/2010  . Chronic depression 02/23/2010  . Cough 11/17/2009  . Plantar verruca 11/17/2009  . Arteriosclerosis of coronary artery 11/16/2009  . Claudication ([place]) 11/16/2009  . Compulsive tobacco user syndrome 11/16/2009  . HLD (hyperlipidemia) 11/16/2009  . Nontoxic single thyroid nodule 11/16/2009  . Obstructive apnea 11/16/2009  . Radial styloid tenosynovitis 11/16/2009  . LBP (low back pain) 11/16/2009  . LAXATIVE ABUSE 12/17/2007  . BP (high blood pressure) 12/17/2007  . Acid reflux 12/17/2007  . HIATAL HERNIA 12/17/2007  . IBS 12/17/2007  . MELANOSIS COLI 12/17/2007  . ARTHRITIS 12/17/2007  . HEPATOMEGALY 12/17/2007    Past Surgical History:  Procedure Laterality Date  . ABDOMINAL  HYSTERECTOMY     partial  . AUGMENTATION MAMMAPLASTY     bilateral 1976 retro   . BREAST ENHANCEMENT SURGERY    . CARDIAC CATHETERIZATION  07/07/98;08/31/01;03/04/05   60 -70% LAD  . CATARACT EXTRACTION    . COLON RESECTION N/A 10/14/2017   Procedure: EXPLORATORY LAPAROTOMY, EXTENDED RIGHT COLECTOMY; APPLICATION OF ABDOMINAL VACUUM DRESSING;  Surgeon: Jovita Kussmaul, MD;  Location: [facility] ORS;  Service: General;  Laterality: N/A;  . ENDOBRONCHIAL ULTRASOUND Bilateral 02/24/2017   Procedure: ENDOBRONCHIAL ULTRASOUND;  Surgeon: Collene Gobble, MD;  Location: [facility] ENDOSCOPY;  Service: Cardiopulmonary;  Laterality: Bilateral;  . EYE SURGERY Left Nov. 2016   Cataract  . EYE SURGERY Right 2001   Cataract  . FEMORAL ARTERY STENT     Left leg  . LAPAROTOMY N/A 10/16/2017   Procedure: EXPLORATORY LAPAROTOMY, PARTIAL OMENTECTOMY, RESECTION ISCHEMIC ILEUM 790WI, APPLICATION OF VAC ABDOMINAL DRESSING;  Surgeon: Armandina Gemma, MD;  Location: [facility] ORS;  Service: General;  Laterality: N/A;  . LAPAROTOMY N/A 10/18/2017   Procedure: RE-EXPLORATION OF ABDOMEN, ILEOSTOMY CREATION;  Surgeon: Stark Klein, MD;  Location: [facility];  Service: General;  Laterality: N/A;  .  LUNG SURGERY  03/2017   Benign polyps removed  . PATELLA REALIGNMENT Left   . TONSILLECTOMY    . TOTAL ABDOMINAL HYSTERECTOMY    . WRIST SURGERY     right     OB History   No obstetric history on file.      Home Medications    Prior to Admission medications   Medication Sig Start Date End Date Taking? Authorizing Provider  acetaminophen (TYLENOL) 325 MG tablet Take 650 mg by mouth every 6 (six) hours as needed for headache (pain).    [provider]  amLODipine (NORVASC) 10 MG tablet Place 1 tablet (10 mg total) into feeding tube daily. Patient taking differently: Take 10 mg by mouth daily.  11/06/17   Florencia Reasons, MD  ARIPiprazole (ABILIFY) 5 MG tablet Place 1 tablet (5 mg total) into feeding tube daily. Patient taking differently: Take 5  mg by mouth daily.  11/06/17   Florencia Reasons, MD  atorvastatin (LIPITOR) 20 MG tablet Take 20 mg by mouth daily.    [provider]  clopidogrel (PLAVIX) 75 MG tablet Take 75 mg by mouth daily.    [provider]  diphenoxylate-atropine (LOMOTIL) 2.5-0.025 MG tablet Take 1 tablet by mouth 2 (two) times daily. 12/03/18   Willia Craze, NP  esomeprazole (NEXIUM) 40 MG capsule Take 40 mg by mouth daily. 07/29/18   [provider]  hydrOXYzine (ATARAX/VISTARIL) 25 MG tablet Take 25 mg by mouth at bedtime as needed (sleep).    [provider]  levETIRAcetam (KEPPRA) 500 MG tablet TAKE 1 TABLET BY MOUTH TWICE A DAY 05/24/19   Dohmeier, Asencion Partridge, MD  loperamide (IMODIUM) 2 MG capsule Take 2 mg by mouth 3 (three) times daily.    [provider]  losartan (COZAAR) 100 MG tablet Take 1/2 tablet ( 50 mg ) daily 04/27/19   [place], Peter M, MD  metoprolol succinate (TOPROL-XL) 100 MG 24 hr tablet Take 100 mg by mouth daily. Take with or immediately following a meal.    [provider]  mirtazapine (REMERON) 15 MG tablet Take 15 mg by mouth at bedtime.    [provider]  montelukast (SINGULAIR) 10 MG tablet Take 10 mg by mouth daily. 08/09/18   [provider]  nitroGLYCERIN (NITROSTAT) 0.4 MG SL tablet Place 1 tablet (0.4 mg total) under the tongue every 5 (five) minutes as needed for chest pain. 11/08/15   [place], Peter M, MD  Pancrelipase, Lip-Prot-Amyl, (ZENPEP) [phone] units CPEP Take 1 capsule by mouth 3 (three) times daily with meals.    [provider]  pantoprazole (PROTONIX) 40 MG tablet Take 40 mg by mouth daily.    [provider]  Psyllium ([employer] NATURAL DAILY FIBER PO) Take 15 mLs by mouth See admin instructions. Mix one tablespoonful (15 mls) in water and drink by mouth twice daily    [provider]  triamcinolone cream (KENALOG) 0.1 % Apply 1 application topically 2 (two) times daily as needed  (rash/itching).    [provider]  venlafaxine XR (EFFEXOR-XR) 150 MG 24 hr capsule Take 150 mg by mouth daily.    [provider]  vitamin B-12 (CYANOCOBALAMIN) 500 MCG tablet Take 500 mcg by mouth daily.    [provider]  Vitamin D, Ergocalciferol, (DRISDOL) 1.25 MG (50000 UT) CAPS capsule Take 50,000 Units by mouth every Wednesday.     [provider]    Family History Family History  Problem Relation Age of Onset  .  Hypertension Mother   . Osteoarthritis Mother   . Pulmonary embolism Mother   . Hypertension Father        aneurysm  . Stroke Father   . Stroke Other   . Colon cancer Paternal Grandfather   . Breast cancer Neg Hx     Social History Social History   Tobacco Use  . Smoking status: Former Smoker    Packs/day: 1.00    Years: 50.00    Pack years: 50.00    Types: Cigarettes    Quit date: 10/14/2010    Years since quitting: 8.6  . Smokeless tobacco: Never Used  Substance Use Topics  . Alcohol use: No  . Drug use: No     Allergies   Adhesive [tape], Codeine, and Iodinated diagnostic agents   Review of Systems Review of Systems  Neurological: Positive for dizziness.  All other systems reviewed and are negative.    Physical Exam Updated Vital Signs BP (!) 132/48 (BP Location: Left Arm) Comment: Simultaneous filing. User may not have seen previous data.  Pulse 71 Comment: Simultaneous filing. User may not have seen previous data.  Temp 98 F (36.7 C) (Oral)   Resp 16 Comment: Simultaneous filing. User may not have seen previous data.  SpO2 100% Comment: Simultaneous filing. User may not have seen previous data.  Physical Exam Vitals signs and nursing note reviewed.  HENT:     Head: Normocephalic.     Nose: Nose normal.     Mouth/Throat:     Mouth: Mucous membranes are dry.     Comments: MM slightly dry  Eyes:     Extraocular Movements: Extraocular movements intact.     Pupils: Pupils are equal, round, and  reactive to light.  Neck:     Musculoskeletal: Normal range of motion.  Cardiovascular:     Rate and Rhythm: Normal rate and regular rhythm.     Pulses: Normal pulses.     Heart sounds: Normal heart sounds.  Pulmonary:     Effort: Pulmonary effort is normal.     Breath sounds: Normal breath sounds.  Abdominal:     General: Abdomen is flat.     Palpations: Abdomen is soft.     Comments: Colostomy with yellowish stool   Musculoskeletal: Normal range of motion.  Skin:    General: Skin is warm.     Capillary Refill: Capillary refill takes less than 2 seconds.  Neurological:     General: No focal deficit present.     Mental Status: She is alert and oriented to person, place, and time.     Comments: CN 2- 12 intact, nl strength throughout, nl finger to nose bilaterally   Psychiatric:        Mood and Affect: Mood normal.        Behavior: Behavior normal.      ED Treatments / Results  Labs (all labs ordered are listed, but only abnormal results are displayed) Labs Reviewed  BASIC METABOLIC PANEL - Abnormal; Notable for the following components:      Result Value   Sodium 127 (*)    CO2 14 (*)    Glucose, Bld 164 (*)    BUN 47 (*)    Creatinine, Ser 2.56 (*)    Calcium 8.4 (*)    GFR calc non Af Amer 18 (*)    GFR calc Af Amer 21 (*)    All other components within normal limits  CBC - Abnormal; Notable for the following  components:   WBC 11.1 (*)    RBC 3.53 (*)    Hemoglobin 10.6 (*)    HCT 32.5 (*)    All other components within normal limits  HEPATIC FUNCTION PANEL - Abnormal; Notable for the following components:   Total Protein 6.1 (*)    Albumin 3.0 (*)    All other components within normal limits  LIPASE, BLOOD  TROPONIN I (HIGH SENSITIVITY)    EKG EKG Interpretation  Date/Time:  Sunday June 20 2019 14:52:00 EDT Ventricular Rate:  64 PR Interval:  170 QRS Duration: 104 QT Interval:  414 QTC Calculation: 427 R Axis:   49 Text Interpretation:   Normal sinus rhythm Normal ECG No significant change since last tracing Confirmed by Wandra Arthurs [phone]) on 06/20/2019 3:23:24 PM   Radiology Ct Head Wo Contrast  Result Date: 06/20/2019 CLINICAL DATA:  Focal neurological deficit of more than 6 hours suspected stroke, history of coronary artery disease, carotid vascular disease, diabetes mellitus, hypertension, stroke, former smoker EXAM: CT HEAD WITHOUT CONTRAST TECHNIQUE: Contiguous axial images were obtained from the base of the skull through the vertex without intravenous contrast. Sagittal and coronal MPR images reconstructed from axial data set. COMPARISON:  04/04/2019 FINDINGS: Brain: Generalized atrophy. Normal ventricular morphology. No midline shift or mass effect. Minimal chronic ischemic changes of deep cerebral white matter. Old subcortical white matter infarct at high posterior LEFT parietal region unchanged. No intracranial hemorrhage, mass lesion, or evidence of acute infarction. No extra-axial fluid collections. Vascular: No hyperdense vessels. Atherosclerotic calcifications of internal carotid and vertebral arteries bilaterally at skull base Skull: Intact Sinuses/Orbits: Clear Other: N/A IMPRESSION: Atrophy with minimal small vessel chronic ischemic changes of deep cerebral white matter. Small old white matter infarct high posterior LEFT parietal. No acute intracranial abnormalities. Electronically Signed   By: Lavonia Dana M.D.   On: 06/20/2019 17:57   Ct Renal Stone Study  Result Date: 06/20/2019 CLINICAL DATA:  Lower back pain, abdominal pain, nausea, acute kidney injury, history coronary artery disease, diabetes mellitus, hypertension, stroke, former smoker EXAM: CT ABDOMEN AND PELVIS WITHOUT CONTRAST TECHNIQUE: Multidetector CT imaging of the abdomen and pelvis was performed following the standard protocol without IV contrast. Sagittal and coronal MPR images reconstructed from axial data set. Oral contrast was not administered.  COMPARISON:  11/21/2017 FINDINGS: Lower chest: Minimal bibasilar atelectasis Hepatobiliary: Gallbladder contracted.  Liver unremarkable. Pancreas: Atrophic pancreas without mass Spleen: Normal appearance Adrenals/Urinary Tract: Adrenal glands normal appearance. Probable small vascular calcification LEFT kidney. Low descent of bladder in pelvis likely cystocele. Kidneys, ureters, and otherwise bladder otherwise normal appearance Stomach/Bowel: Prior RIGHT hemicolectomy. Long Hartmann pouch with proximal end located within a large infraumbilical hernia. Small bowel loops decompressed, unremarkable. Stomach distended by fluid. Vascular/Lymphatic: Extensive atherosclerotic calcifications of aorta, iliac arteries, and visceral arteries. Coronary arterial calcification. Aorta normal caliber. No adenopathy. Reproductive: Uterus surgically absent. Normal appearing LEFT ovary. Nonvisualization of RIGHT ovary. Other: Two additional supraumbilical ventral hernias are identified containing fat. No free air or free fluid. Musculoskeletal: Diffuse osseous demineralization. IMPRESSION: Three ventral hernias, 2 of which contain fat, and the third/inferior most which contains nonobstructed end of a Hartmann's pouch. Prior RIGHT hemicolectomy with ileostomy. Probable cystocele. Extensive atherosclerotic disease. Electronically Signed   By: Lavonia Dana M.D.   On: 06/20/2019 18:05    Procedures Procedures (including critical care time)  Medications Ordered in ED Medications  sodium chloride 0.9 % bolus 1,000 mL (0 mLs Intravenous Stopped 06/20/19 1900)     Initial  Impression / Assessment and Plan / ED Course  I have reviewed the triage vital signs and the nursing notes.  Pertinent labs & imaging results that were available during my care of the patient were reviewed by me and considered in my medical decision making (see chart for details).       [name] is a 73 y.o. female here with dizziness and near syncope.   Patient has a colostomy and states that she had poor appetite and appears slightly dehydrated.  She apparently was orthostatic prior to arrival as well.  Will get lab work as well as CT head. Will give IVF and reassess.   8:34 PM Patient's labs showed sodium of 127, Cr slightly elevated at 2.5. CT ab/pel showed some ventral hernias with no obstruction. She likely has poor absorption given colostomy. Given 2 L NS bolus and no longer orthostatic. I encourage her to stay hydrate and get repeat BMP in a week and follow up with surgery.    Final Clinical Impressions(s) / ED Diagnoses   Final diagnoses:  None    ED Discharge Orders    None       Drenda Freeze, MD 06/20/19 2036

## 2019-06-20 NOTE — ED Triage Notes (Signed)
Pt assisted to [facility] via [place]  tol well.

## 2019-06-20 NOTE — ED Triage Notes (Addendum)
Pt B[facility] [place] [employer] where she had a sudden episode of HA, dizziness, nausea and blurred vision. Upon EMS arrival BP 86/48, after fluids BP 100/48. VSS, Ems gave 4mg  of zofran IV. Pt reports random episodes of HA and blurred vision for 2 weeks, but w/o nausea or dizziness. No neuro deficits.

## 2019-06-20 NOTE — Discharge Instructions (Signed)
You likely had poor absorption from your GI surgery. Follow up with Dr. Fuller Plan or your surgeon   Stay hydrated.   Your kidney function is slightly abnormal from dehydration. Repeat kidney function in a week with your doctor   Follow up with your primary care doctor  Return to ER if you have another episode of passing out, severe abdominal pain, vomiting, fever

## 2019-06-20 NOTE — ED Notes (Signed)
Patient verbalized understanding of discharge instructions. Opportunity for questions were provided. Pt. ambulatory and discharged home.  

## 2019-07-05 NOTE — Telephone Encounter (Signed)
[name], can you tell if we refilled this? Thanks

## 2019-07-13 NOTE — Telephone Encounter (Signed)
[name], I will give her a refill but please get her in to see Dr. Fuller Plan. I though she was having a takedown of ostomy but don't see that it was done. She hasn't been seen in several months. Thanks

## 2019-07-14 NOTE — Telephone Encounter (Signed)
Needs to make an appointment with Dr. Fuller Plan per [place], [place]

## 2019-08-04 DIAGNOSIS — E871 Hypo-osmolality and hyponatremia: Secondary | ICD-10-CM | POA: Insufficient documentation

## 2019-08-17 ENCOUNTER — Ambulatory Visit: Payer: 59 | Admitting: Gastroenterology

## 2019-08-24 ENCOUNTER — Encounter (HOSPITAL_COMMUNITY): Payer: Self-pay | Admitting: Emergency Medicine

## 2019-08-24 ENCOUNTER — Emergency Department (HOSPITAL_COMMUNITY): Payer: 59

## 2019-08-24 ENCOUNTER — Other Ambulatory Visit: Payer: Self-pay

## 2019-08-24 ENCOUNTER — Inpatient Hospital Stay (HOSPITAL_COMMUNITY)
Admission: EM | Admit: 2019-08-24 | Discharge: 2019-08-27 | DRG: 683 | Disposition: A | Discharge status: Home / Self Care | Payer: 59 | Attending: Internal Medicine | Admitting: Internal Medicine

## 2019-08-24 DIAGNOSIS — K5790 Diverticulosis of intestine, part unspecified, without perforation or abscess without bleeding: Secondary | ICD-10-CM | POA: Diagnosis present

## 2019-08-24 DIAGNOSIS — Z79899 Other long term (current) drug therapy: Secondary | ICD-10-CM

## 2019-08-24 DIAGNOSIS — Z7902 Long term (current) use of antithrombotics/antiplatelets: Secondary | ICD-10-CM

## 2019-08-24 DIAGNOSIS — E1151 Type 2 diabetes mellitus with diabetic peripheral angiopathy without gangrene: Secondary | ICD-10-CM | POA: Diagnosis present

## 2019-08-24 DIAGNOSIS — R569 Unspecified convulsions: Secondary | ICD-10-CM

## 2019-08-24 DIAGNOSIS — F329 Major depressive disorder, single episode, unspecified: Secondary | ICD-10-CM | POA: Diagnosis present

## 2019-08-24 DIAGNOSIS — G40909 Epilepsy, unspecified, not intractable, without status epilepticus: Secondary | ICD-10-CM | POA: Diagnosis present

## 2019-08-24 DIAGNOSIS — E1122 Type 2 diabetes mellitus with diabetic chronic kidney disease: Secondary | ICD-10-CM | POA: Diagnosis present

## 2019-08-24 DIAGNOSIS — K580 Irritable bowel syndrome with diarrhea: Secondary | ICD-10-CM | POA: Diagnosis present

## 2019-08-24 DIAGNOSIS — J439 Emphysema, unspecified: Secondary | ICD-10-CM | POA: Diagnosis present

## 2019-08-24 DIAGNOSIS — K219 Gastro-esophageal reflux disease without esophagitis: Secondary | ICD-10-CM | POA: Diagnosis present

## 2019-08-24 DIAGNOSIS — N179 Acute kidney failure, unspecified: Principal | ICD-10-CM | POA: Diagnosis present

## 2019-08-24 DIAGNOSIS — K559 Vascular disorder of intestine, unspecified: Secondary | ICD-10-CM | POA: Diagnosis present

## 2019-08-24 DIAGNOSIS — Z8249 Family history of ischemic heart disease and other diseases of the circulatory system: Secondary | ICD-10-CM

## 2019-08-24 DIAGNOSIS — I2510 Atherosclerotic heart disease of native coronary artery without angina pectoris: Secondary | ICD-10-CM | POA: Diagnosis present

## 2019-08-24 DIAGNOSIS — K861 Other chronic pancreatitis: Secondary | ICD-10-CM | POA: Diagnosis present

## 2019-08-24 DIAGNOSIS — E538 Deficiency of other specified B group vitamins: Secondary | ICD-10-CM | POA: Diagnosis present

## 2019-08-24 DIAGNOSIS — Z933 Colostomy status: Secondary | ICD-10-CM

## 2019-08-24 DIAGNOSIS — Z7982 Long term (current) use of aspirin: Secondary | ICD-10-CM

## 2019-08-24 DIAGNOSIS — N183 Chronic kidney disease, stage 3 unspecified: Secondary | ICD-10-CM | POA: Diagnosis present

## 2019-08-24 DIAGNOSIS — K902 Blind loop syndrome, not elsewhere classified: Secondary | ICD-10-CM | POA: Diagnosis present

## 2019-08-24 DIAGNOSIS — N189 Chronic kidney disease, unspecified: Secondary | ICD-10-CM | POA: Diagnosis present

## 2019-08-24 DIAGNOSIS — Z823 Family history of stroke: Secondary | ICD-10-CM

## 2019-08-24 DIAGNOSIS — E875 Hyperkalemia: Secondary | ICD-10-CM

## 2019-08-24 DIAGNOSIS — K760 Fatty (change of) liver, not elsewhere classified: Secondary | ICD-10-CM | POA: Diagnosis present

## 2019-08-24 DIAGNOSIS — N1831 Chronic kidney disease, stage 3a: Secondary | ICD-10-CM | POA: Diagnosis present

## 2019-08-24 DIAGNOSIS — F419 Anxiety disorder, unspecified: Secondary | ICD-10-CM | POA: Diagnosis present

## 2019-08-24 DIAGNOSIS — R197 Diarrhea, unspecified: Secondary | ICD-10-CM | POA: Diagnosis present

## 2019-08-24 DIAGNOSIS — I129 Hypertensive chronic kidney disease with stage 1 through stage 4 chronic kidney disease, or unspecified chronic kidney disease: Secondary | ICD-10-CM | POA: Diagnosis present

## 2019-08-24 DIAGNOSIS — Z20828 Contact with and (suspected) exposure to other viral communicable diseases: Secondary | ICD-10-CM | POA: Diagnosis present

## 2019-08-24 DIAGNOSIS — Z87891 Personal history of nicotine dependence: Secondary | ICD-10-CM

## 2019-08-24 DIAGNOSIS — Z932 Ileostomy status: Secondary | ICD-10-CM

## 2019-08-24 DIAGNOSIS — K599 Functional intestinal disorder, unspecified: Secondary | ICD-10-CM | POA: Diagnosis present

## 2019-08-24 DIAGNOSIS — E785 Hyperlipidemia, unspecified: Secondary | ICD-10-CM | POA: Diagnosis present

## 2019-08-24 DIAGNOSIS — Z8673 Personal history of transient ischemic attack (TIA), and cerebral infarction without residual deficits: Secondary | ICD-10-CM

## 2019-08-24 DIAGNOSIS — N185 Chronic kidney disease, stage 5: Secondary | ICD-10-CM

## 2019-08-24 LAB — CBC
HCT: 36.9 % (ref 36.0–46.0)
Hemoglobin: 12.2 g/dL (ref 12.0–15.0)
MCH: 29.9 pg (ref 26.0–34.0)
MCHC: 33.1 g/dL (ref 30.0–36.0)
MCV: 90.4 fL (ref 80.0–100.0)
Platelets: 275 10*3/uL (ref 150–400)
RBC: 4.08 MIL/uL (ref 3.87–5.11)
RDW: 12.9 % (ref 11.5–15.5)
WBC: 12.4 10*3/uL — ABNORMAL HIGH (ref 4.0–10.5)
nRBC: 0 % (ref 0.0–0.2)

## 2019-08-24 LAB — URINALYSIS, ROUTINE W REFLEX MICROSCOPIC
Bilirubin Urine: NEGATIVE
Glucose, UA: NEGATIVE mg/dL
Hgb urine dipstick: NEGATIVE
Ketones, ur: 15 mg/dL — AB
Nitrite: NEGATIVE
Protein, ur: NEGATIVE mg/dL
Specific Gravity, Urine: 1.025 (ref 1.005–1.030)
pH: 5 (ref 5.0–8.0)

## 2019-08-24 LAB — URINALYSIS, MICROSCOPIC (REFLEX)
RBC / HPF: NONE SEEN RBC/hpf (ref 0–5)
WBC, UA: NONE SEEN WBC/hpf (ref 0–5)

## 2019-08-24 LAB — BASIC METABOLIC PANEL
Anion gap: 12 (ref 5–15)
BUN: 40 mg/dL — ABNORMAL HIGH (ref 8–23)
CO2: 19 mmol/L — ABNORMAL LOW (ref 22–32)
Calcium: 9.5 mg/dL (ref 8.9–10.3)
Chloride: 97 mmol/L — ABNORMAL LOW (ref 98–111)
Creatinine, Ser: 3.64 mg/dL — ABNORMAL HIGH (ref 0.44–1.00)
GFR calc Af Amer: 14 mL/min — ABNORMAL LOW (ref >60)
GFR calc non Af Amer: 12 mL/min — ABNORMAL LOW (ref >60)
Glucose, Bld: 133 mg/dL — ABNORMAL HIGH (ref 70–99)
Potassium: 5.2 mmol/L — ABNORMAL HIGH (ref 3.5–5.1)
Sodium: 128 mmol/L — ABNORMAL LOW (ref 135–145)

## 2019-08-24 MED ORDER — SODIUM CHLORIDE 0.9% FLUSH: 3.0000 mL | Freq: Once | INTRAVENOUS | Status: DC

## 2019-08-24 NOTE — ED Triage Notes (Signed)
Pt here from home with possible dehydration , pt has an ileostomy that has been putting out more recently , pt was here in sept. For same thing , no chest pain

## 2019-08-25 ENCOUNTER — Encounter (HOSPITAL_COMMUNITY): Payer: Self-pay | Admitting: Internal Medicine

## 2019-08-25 ENCOUNTER — Other Ambulatory Visit: Payer: Self-pay

## 2019-08-25 DIAGNOSIS — N179 Acute kidney failure, unspecified: Secondary | ICD-10-CM | POA: Diagnosis not present

## 2019-08-25 LAB — BASIC METABOLIC PANEL
Anion gap: 11 (ref 5–15)
BUN: 45 mg/dL — ABNORMAL HIGH (ref 8–23)
CO2: 15 mmol/L — ABNORMAL LOW (ref 22–32)
Calcium: 9.3 mg/dL (ref 8.9–10.3)
Chloride: 100 mmol/L (ref 98–111)
Creatinine, Ser: 3.4 mg/dL — ABNORMAL HIGH (ref 0.44–1.00)
GFR calc Af Amer: 15 mL/min — ABNORMAL LOW (ref >60)
GFR calc non Af Amer: 13 mL/min — ABNORMAL LOW (ref >60)
Glucose, Bld: 102 mg/dL — ABNORMAL HIGH (ref 70–99)
Potassium: 5.6 mmol/L — ABNORMAL HIGH (ref 3.5–5.1)
Sodium: 126 mmol/L — ABNORMAL LOW (ref 135–145)

## 2019-08-25 LAB — CBC WITH DIFFERENTIAL/PLATELET
Abs Immature Granulocytes: 0.09 10*3/uL — ABNORMAL HIGH (ref 0.00–0.07)
Basophils Absolute: 0.1 10*3/uL (ref 0.0–0.1)
Basophils Relative: 1 %
Eosinophils Absolute: 0.4 10*3/uL (ref 0.0–0.5)
Eosinophils Relative: 3 %
HCT: 35.9 % — ABNORMAL LOW (ref 36.0–46.0)
Hemoglobin: 12 g/dL (ref 12.0–15.0)
Immature Granulocytes: 1 %
Lymphocytes Relative: 26 %
Lymphs Abs: 3 10*3/uL (ref 0.7–4.0)
MCH: 30.2 pg (ref 26.0–34.0)
MCHC: 33.4 g/dL (ref 30.0–36.0)
MCV: 90.2 fL (ref 80.0–100.0)
Monocytes Absolute: 0.9 10*3/uL (ref 0.1–1.0)
Monocytes Relative: 8 %
Neutro Abs: 7.3 10*3/uL (ref 1.7–7.7)
Neutrophils Relative %: 61 %
Platelets: 257 10*3/uL (ref 150–400)
RBC: 3.98 MIL/uL (ref 3.87–5.11)
RDW: 12.9 % (ref 11.5–15.5)
WBC: 11.8 10*3/uL — ABNORMAL HIGH (ref 4.0–10.5)
nRBC: 0 % (ref 0.0–0.2)

## 2019-08-25 LAB — SARS CORONAVIRUS 2 (TAT 6-24 HRS): SARS Coronavirus 2: NEGATIVE

## 2019-08-25 MED ORDER — VENLAFAXINE HCL ER 150 MG PO CP24
150.0000 mg | ORAL_CAPSULE | Freq: Every day | ORAL | Status: DC
  Administered 2019-08-25 – 2019-08-27 (×3): 150 mg via ORAL
  Filled 2019-08-25 (×3): qty 1

## 2019-08-25 MED ORDER — VITAMIN B-12 100 MCG PO TABS
500.0000 ug | ORAL_TABLET | Freq: Every day | ORAL | Status: DC
  Administered 2019-08-25 – 2019-08-27 (×3): 500 ug via ORAL
  Filled 2019-08-25 (×3): qty 5

## 2019-08-25 MED ORDER — ARIPIPRAZOLE 5 MG PO TABS
5.0000 mg | ORAL_TABLET | Freq: Every day | ORAL | Status: DC
  Administered 2019-08-25 – 2019-08-27 (×3): 5 mg via ORAL
  Filled 2019-08-25 (×3): qty 1

## 2019-08-25 MED ORDER — MIRTAZAPINE 15 MG PO TABS
15.0000 mg | ORAL_TABLET | Freq: Every day | ORAL | Status: DC
  Administered 2019-08-25 – 2019-08-26 (×2): 15 mg via ORAL
  Filled 2019-08-25 (×2): qty 1

## 2019-08-25 MED ORDER — LACTATED RINGERS IV BOLUS
1000.0000 mL | Freq: Once | INTRAVENOUS | Status: AC
  Administered 2019-08-25: 1000 mL via INTRAVENOUS

## 2019-08-25 MED ORDER — HEPARIN SODIUM (PORCINE) 5000 UNIT/ML IJ SOLN
5000.0000 [IU] | Freq: Three times a day (TID) | INTRAMUSCULAR | Status: DC
  Administered 2019-08-26: 5000 [IU] via SUBCUTANEOUS
  Filled 2019-08-25 (×2): qty 1

## 2019-08-25 MED ORDER — CLOPIDOGREL BISULFATE 75 MG PO TABS
75.0000 mg | ORAL_TABLET | Freq: Every day | ORAL | Status: DC
  Administered 2019-08-25 – 2019-08-27 (×3): 75 mg via ORAL
  Filled 2019-08-25 (×3): qty 1

## 2019-08-25 MED ORDER — MONTELUKAST SODIUM 10 MG PO TABS
10.0000 mg | ORAL_TABLET | Freq: Every day | ORAL | Status: DC
  Administered 2019-08-25 – 2019-08-27 (×3): 10 mg via ORAL
  Filled 2019-08-25 (×3): qty 1

## 2019-08-25 MED ORDER — LEVETIRACETAM 500 MG PO TABS
500.0000 mg | ORAL_TABLET | Freq: Two times a day (BID) | ORAL | Status: DC
  Administered 2019-08-25 – 2019-08-27 (×4): 500 mg via ORAL
  Filled 2019-08-25 (×4): qty 1

## 2019-08-25 MED ORDER — AMLODIPINE BESYLATE 10 MG PO TABS
10.0000 mg | ORAL_TABLET | Freq: Every day | ORAL | Status: DC
  Administered 2019-08-25 – 2019-08-27 (×3): 10 mg via ORAL
  Filled 2019-08-25 (×3): qty 1

## 2019-08-25 MED ORDER — ACETAMINOPHEN 325 MG PO TABS: 650.0000 mg | ORAL_TABLET | Freq: Four times a day (QID) | ORAL | Status: DC | PRN

## 2019-08-25 MED ORDER — ATORVASTATIN CALCIUM 10 MG PO TABS
20.0000 mg | ORAL_TABLET | Freq: Every day | ORAL | Status: DC
  Administered 2019-08-25 – 2019-08-27 (×3): 20 mg via ORAL
  Filled 2019-08-25 (×3): qty 2

## 2019-08-25 MED ORDER — ONDANSETRON HCL 4 MG PO TABS: 4.0000 mg | ORAL_TABLET | Freq: Four times a day (QID) | ORAL | Status: DC | PRN

## 2019-08-25 MED ORDER — PANTOPRAZOLE SODIUM 40 MG PO TBEC
40.0000 mg | DELAYED_RELEASE_TABLET | Freq: Every day | ORAL | Status: DC
  Administered 2019-08-25 – 2019-08-27 (×3): 40 mg via ORAL
  Filled 2019-08-25 (×3): qty 1

## 2019-08-25 MED ORDER — METOPROLOL SUCCINATE ER 100 MG PO TB24
100.0000 mg | ORAL_TABLET | Freq: Every day | ORAL | Status: DC
  Administered 2019-08-25: 100 mg via ORAL
  Filled 2019-08-25 (×2): qty 1

## 2019-08-25 MED ORDER — DIPHENOXYLATE-ATROPINE 2.5-0.025 MG PO TABS
1.0000 | ORAL_TABLET | Freq: Two times a day (BID) | ORAL | Status: DC
  Administered 2019-08-25: 1 via ORAL
  Filled 2019-08-25: qty 1

## 2019-08-25 MED ORDER — SODIUM CHLORIDE 0.9 % IV SOLN
INTRAVENOUS | Status: DC
  Administered 2019-08-25 – 2019-08-27 (×4): via INTRAVENOUS

## 2019-08-25 MED ORDER — ONDANSETRON HCL 4 MG/2ML IJ SOLN: 4.0000 mg | Freq: Four times a day (QID) | INTRAMUSCULAR | Status: DC | PRN

## 2019-08-25 NOTE — H&P (Addendum)
History and Physical    [name] LNL:[id number] DOB: [birth year] DOA: 08/24/2019  PCP: Leanna Battles, MD  Patient coming from: Home.  I have personally briefly reviewed patient's old medical records available.   Chief Complaint: Dizziness, excessive ileostomy output and lightheadedness.  HPI: [name] is a 73 y.o. 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  Patient came to the emergency room yesterday about 4 PM and she has been waiting in the waiting room all night.  According to the patient, she has been experiencing some headache, dizziness, episodic lightheadedness and blurry vision since about 2 weeks now.  She has poor appetite and having loose stools with increased frequency and amount since last 2 weeks.  These episodically happens to her.  Last time she was in the hospital 2 months ago with similar episode.  Patient visited emergency room a week ago, was treated with IV fluids and discharged back home.  She also continues to take her blood pressure medications. ED Course: 08/24/2019, on arrival to the ER ER waiting room, blood pressures are stable and matter-of-fact elevated.  WBC count is 12.4.  Sodium 128, potassium 5.2.  Creatinine is 3.64. No fever or chills.  Denies any respiratory symptoms.  Review of Systems: all systems are reviewed and pertinent positive as per HPI otherwise rest are negative.    Past Medical History:  Diagnosis Date   Anxiety    Arthropathy, unspecified, site unspecified    Blind loop syndrome    Carotid artery disease ([place])    documented occlusion of left ICA   Coronary artery disease    a. known 60-70% mid-LAD stenosis with caths  in 1999, 2002, 2006, and 2012 showing stable anatomy b. 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[place])    Diabetes mellitus    Diverticulosis    Dizziness    chronic   Dyslipidemia    Dyspnea    with exertion   Fatty liver    GERD (gastroesophageal reflux disease)    hiatial hernia   Hepatomegaly    Hiatal hernia    HTN (hypertension)    Hyperlipidemia    Hypertension    Irritable bowel syndrome    Melanosis coli    Metatarsal fracture right   Normal nuclear stress test    nml 01/03/10;06/19/07   Other, mixed, or unspecified nondependent drug abuse, unspecified    Pancreatitis, chronic ([place])    Peripheral vascular disease ([place]) 02/20/10   s/p stent of left SFA   Seizure ([place]) 11/04/2018   Sleep apnea    Stroke ([place])    Thyroid nodule    Unspecified essential hypertension    Vitamin B12 deficiency     Past Surgical History:  Procedure Laterality Date   ABDOMINAL HYSTERECTOMY     partial   AUGMENTATION MAMMAPLASTY     bilateral 1976 retro    BREAST ENHANCEMENT SURGERY     CARDIAC CATHETERIZATION  07/07/98;08/31/01;03/04/05   60 -70% LAD   CATARACT EXTRACTION     COLON RESECTION N/A 10/14/2017   Procedure: EXPLORATORY LAPAROTOMY, EXTENDED RIGHT COLECTOMY; APPLICATION OF ABDOMINAL VACUUM DRESSING;  Surgeon: Jovita Kussmaul, MD;  Location: [facility];  Service: General;  Laterality: N/A;  ENDOBRONCHIAL ULTRASOUND Bilateral 02/24/2017   Procedure: ENDOBRONCHIAL ULTRASOUND;  Surgeon: Collene Gobble, MD;  Location: [facility];  Service: Cardiopulmonary;  Laterality: Bilateral;   EYE SURGERY Left Nov. 2016   Cataract   EYE SURGERY Right 2001   Cataract   FEMORAL ARTERY STENT     Left leg   LAPAROTOMY N/A 10/16/2017   Procedure: EXPLORATORY LAPAROTOMY, PARTIAL OMENTECTOMY, RESECTION ISCHEMIC ILEUM 846NG, APPLICATION OF VAC ABDOMINAL DRESSING;  Surgeon: Armandina Gemma, MD;  Location: [facility];  Service: General;  Laterality: N/A;    LAPAROTOMY N/A 10/18/2017   Procedure: RE-EXPLORATION OF ABDOMEN, ILEOSTOMY CREATION;  Surgeon: Stark Klein, MD;  Location: [facility];  Service: General;  Laterality: N/A;   LUNG SURGERY  03/2017   Benign polyps removed   PATELLA REALIGNMENT Left    TONSILLECTOMY     TOTAL ABDOMINAL HYSTERECTOMY     WRIST SURGERY     right     reports that she quit smoking about 8 years ago. Her smoking use included cigarettes. She has a 50.00 pack-year smoking history. She has never used smokeless tobacco. She reports that she does not drink alcohol or use drugs.  Allergies  Allergen Reactions   Adhesive [Tape] Other (See Comments)    "Took off my skin"   Codeine Other (See Comments)    Altered mental state    Iodinated Diagnostic Agents Rash    Family History  Problem Relation Age of Onset   Hypertension Mother    Osteoarthritis Mother    Pulmonary embolism Mother    Hypertension Father        aneurysm   Stroke Father    Stroke Other    Colon cancer Paternal Grandfather    Breast cancer Neg Hx      Prior to Admission medications   Medication Sig Start Date End Date Taking? Authorizing Provider  acetaminophen (TYLENOL) 325 MG tablet Take 650 mg by mouth every 6 (six) hours as needed for headache (pain).    [provider]  amLODipine (NORVASC) 10 MG tablet Place 1 tablet (10 mg total) into feeding tube daily. Patient taking differently: Take 10 mg by mouth daily.  11/06/17   Florencia Reasons, MD  ARIPiprazole (ABILIFY) 5 MG tablet Place 1 tablet (5 mg total) into feeding tube daily. Patient taking differently: Take 5 mg by mouth daily.  11/06/17   Florencia Reasons, MD  atorvastatin (LIPITOR) 20 MG tablet Take 20 mg by mouth daily.    [provider]  clopidogrel (PLAVIX) 75 MG tablet Take 75 mg by mouth daily.    [provider]  diphenoxylate-atropine (LOMOTIL) 2.5-0.025 MG tablet TAKE 1 TABLET BY MOUTH TWICE A DAY 07/14/19   Willia Craze, NP  esomeprazole  (NEXIUM) 40 MG capsule Take 40 mg by mouth daily. 07/29/18   [provider]  hydrOXYzine (ATARAX/VISTARIL) 25 MG tablet Take 25 mg by mouth at bedtime as needed (sleep).    [provider]  levETIRAcetam (KEPPRA) 500 MG tablet TAKE 1 TABLET BY MOUTH TWICE A DAY 05/24/19   Dohmeier, Asencion Partridge, MD  loperamide (IMODIUM) 2 MG capsule Take 2 mg by mouth 3 (three) times daily.    [provider]  losartan (COZAAR) 100 MG tablet Take 1/2 tablet ( 50 mg ) daily 04/27/19   [place], Peter M, MD  metoprolol succinate (TOPROL-XL) 100 MG 24 hr tablet Take 100 mg by mouth daily. Take with or immediately following a meal.    [provider]  mirtazapine (  REMERON) 15 MG tablet Take 15 mg by mouth at bedtime.    [provider]  montelukast (SINGULAIR) 10 MG tablet Take 10 mg by mouth daily. 08/09/18   [provider]  nitroGLYCERIN (NITROSTAT) 0.4 MG SL tablet Place 1 tablet (0.4 mg total) under the tongue every 5 (five) minutes as needed for chest pain. 11/08/15   [place], Peter M, MD  Pancrelipase, Lip-Prot-Amyl, (ZENPEP) [phone] units CPEP Take 1 capsule by mouth 3 (three) times daily with meals.    [provider]  pantoprazole (PROTONIX) 40 MG tablet Take 40 mg by mouth daily.    [provider]  Psyllium ([employer] NATURAL DAILY FIBER PO) Take 15 mLs by mouth See admin instructions. Mix one tablespoonful (15 mls) in water and drink by mouth twice daily    [provider]  triamcinolone cream (KENALOG) 0.1 % Apply 1 application topically 2 (two) times daily as needed (rash/itching).    [provider]  venlafaxine XR (EFFEXOR-XR) 150 MG 24 hr capsule Take 150 mg by mouth daily.    [provider]  vitamin B-12 (CYANOCOBALAMIN) 500 MCG tablet Take 500 mcg by mouth daily.    [provider]  Vitamin D, Ergocalciferol, (DRISDOL) 1.25 MG (50000 UT) CAPS capsule Take 50,000 Units by mouth every Wednesday.     [provider]    Physical Exam: Vitals:   08/25/19 0053 08/25/19 0522 08/25/19 0814 08/25/19 1030  BP: (!) 178/87 (!) 175/83 (!) 146/83 (!) 181/68  Pulse: 73 79 79 67  Resp: 16 14 16 14   Temp: 97.9 F (36.6 C) 97.7 F (36.5 C)    TempSrc: Oral Oral    SpO2: 97% 98% 98% 99%    Constitutional: NAD, calm, comfortable Vitals:   08/25/19 0053 08/25/19 0522 08/25/19 0814 08/25/19 1030  BP: (!) 178/87 (!) 175/83 (!) 146/83 (!) 181/68  Pulse: 73 79 79 67  Resp: 16 14 16 14   Temp: 97.9 F (36.6 C) 97.7 F (36.5 C)    TempSrc: Oral Oral    SpO2: 97% 98% 98% 99%   Eyes: PERRL, lids and conjunctivae normal ENMT: Mucous membranes are dry. Posterior pharynx clear of any exudate or lesions.Normal dentition.  Neck: normal, supple, no masses, no thyromegaly Respiratory: clear to auscultation bilaterally, no wheezing, no crackles. Normal respiratory effort. No accessory muscle use.  Cardiovascular: Regular rate and rhythm, no murmurs / rubs / gallops. No extremity edema. 2+ pedal pulses. No carotid bruits.  Abdomen: no tenderness, no masses palpated. No hepatosplenomegaly. Bowel sounds positive.  Right lower quadrant ileostomy bag filled with greenish liquidy stool. Musculoskeletal: no clubbing / cyanosis. No joint deformity upper and lower extremities. Good ROM, no contractures. Normal muscle tone.  Skin: no rashes, lesions, ulcers. No induration Neurologic: CN 2-12 grossly intact. Sensation intact, DTR normal. Strength 5/5 in all 4.  Psychiatric: Normal judgment and insight. Alert and oriented x 3. Normal mood.     Labs on Admission: I have personally reviewed following labs and imaging studies  CBC: Recent Labs  Lab 08/24/19 1646  WBC 12.4*  HGB 12.2  HCT 36.9  MCV 90.4  PLT 053   Basic Metabolic Panel: Recent Labs  Lab 08/24/19 1646  NA 128*  K 5.2*  CL 97*  CO2 19*  GLUCOSE 133*  BUN 40*  CREATININE 3.64*  CALCIUM 9.5   GFR: CrCl cannot be calculated (Unknown  ideal weight.). Liver Function Tests: No results for input(s): AST, ALT, ALKPHOS, BILITOT, PROT, ALBUMIN  in the last 168 hours. No results for input(s): LIPASE, AMYLASE in the last 168 hours. No results for input(s): AMMONIA in the last 168 hours. Coagulation Profile: No results for input(s): INR, PROTIME in the last 168 hours. Cardiac Enzymes: No results for input(s): CKTOTAL, CKMB, CKMBINDEX, TROPONINI in the last 168 hours. BNP (last 3 results) No results for input(s): PROBNP in the last 8760 hours. HbA1C: No results for input(s): HGBA1C in the last 72 hours. CBG: No results for input(s): GLUCAP in the last 168 hours. Lipid Profile: No results for input(s): CHOL, HDL, LDLCALC, TRIG, CHOLHDL, LDLDIRECT in the last 72 hours. Thyroid Function Tests: No results for input(s): TSH, T4TOTAL, FREET4, T3FREE, THYROIDAB in the last 72 hours. Anemia Panel: No results for input(s): VITAMINB12, FOLATE, FERRITIN, TIBC, IRON, RETICCTPCT in the last 72 hours. Urine analysis:    Component Value Date/Time   COLORURINE YELLOW 08/24/2019 1830   APPEARANCEUR HAZY (A) 08/24/2019 1830   LABSPEC 1.025 08/24/2019 1830   PHURINE 5.0 08/24/2019 1830   GLUCOSEU NEGATIVE 08/24/2019 1830   HGBUR NEGATIVE 08/24/2019 1830   BILIRUBINUR NEGATIVE 08/24/2019 1830   KETONESUR 15 (A) 08/24/2019 1830   PROTEINUR NEGATIVE 08/24/2019 1830   NITRITE NEGATIVE 08/24/2019 1830   LEUKOCYTESUR SMALL (A) 08/24/2019 1830    Radiological Exams on Admission: Dg Chest 2 View  Result Date: 08/24/2019 CLINICAL DATA:  Shortness of breath EXAM: CHEST - 2 VIEW COMPARISON:  11/17/2017 FINDINGS: The heart size and mediastinal contours are within normal limits. Both lungs are clear. Aortic atherosclerosis. The visualized skeletal structures are unremarkable. IMPRESSION: No active cardiopulmonary disease. Electronically Signed   By: Donavan Foil M.D.   On: 08/24/2019 17:49    EKG: Independently reviewed.  Sinus rhythm.   Comparable to previous EKGs.  No new changes.  Assessment/Plan Principal Problem:   AKI (acute kidney injury) ([place]) Active Problems:   Diarrhea   Dyslipidemia   Intestinal motility disorder   Seizure ([facility])   CKD (chronic kidney disease) stage 3, GFR 30-59 ml/min     1.  Acute kidney injury with history of stage IIIa chronic kidney disease: This is probably prerenal, poor appetite, high output ileostomy and ongoing use of antihypertensives including losartan. Agree with admission given severity of symptoms.  Will start with 1 L of IV fluid bolus and continue with maintenance IV fluids.  Intake and output monitoring.  Adequate hydration.  Further IV fluid boluses as needed.  Recheck renal functions in the morning.  Patient unlikely has any urinary obstruction, CT scan of the abdomen pelvis without contrast on 08/20/2019 was fairly normal. Will use Lomotil as she had done in the past to see if that is effective. We will check orthostatic vitals to look for adequacy of resuscitation.  2.  Uncontrolled hypertension: Blood pressures 180 in the morning.  She has been in the emergency room since yesterday and has not been able to take any of her blood medications. Holding losartan.  Resume amlodipine.  3.  Diet-controlled diabetes: Currently not on treatment.  4.  Coronary artery disease: Patient on beta-blockers and Plavix that she will continue.  Stable.  Denies any chest pain.  5.  Seizure disorder: Patient on Keppra, stable.   DVT prophylaxis: Heparin subcu Code Status: Full code Family Communication: None Disposition Plan: Observation Consults called: None Admission status: Observation, MedSurg   Barb Merino MD [facility] Pager [phone]

## 2019-08-25 NOTE — ED Provider Notes (Signed)
[facility] EMERGENCY DEPARTMENT Provider Note   CSN: [id number] Arrival date & time: 08/24/19  1523     History   Chief Complaint Chief Complaint  Patient presents with  . Dehydration    HPI [name] is a 73 y.o. female.     HPI 73 year old female comes in a chief complaint of dehydration. 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  She comes in because over the last 2 weeks she has had increased stool output.  The last 2 days the symptoms have actually progressed and she is having more watery stools. She is also feeling weak and dizzy when she gets up.  She denies any recent antibiotics, fevers, nausea, vomiting.  She denies any COVID-19 exposures.  Past Medical History:  Diagnosis Date  . Anxiety   . Arthropathy, unspecified, site unspecified   . Blind loop syndrome   . Carotid artery disease ([place])    documented occlusion of left ICA  . Coronary artery disease    a. known 60-70% mid-LAD stenosis with caths in 1999, 2002, 2006, and 2012 showing stable anatomy b. low-risk NST in 10/2015  . Depression   . Diabetes insipidus ([place])   . Diabetes mellitus   . Diverticulosis   . Dizziness    chronic  . Dyslipidemia   . Dyspnea    with exertion  . Fatty liver   . GERD (gastroesophageal reflux disease)    hiatial hernia  . Hepatomegaly   . Hiatal hernia   . HTN (hypertension)   . Hyperlipidemia   . Hypertension   . Irritable bowel syndrome   . Melanosis coli   . Metatarsal fracture right  . Normal nuclear stress test    nml 01/03/10;06/19/07  . Other, mixed, or unspecified nondependent drug abuse, unspecified   . Pancreatitis, chronic ([place])   . Peripheral vascular disease ([place]) 02/20/10   s/p stent of left SFA  . Seizure ([place]) 11/04/2018  . Sleep apnea   . Stroke ([place])   . Thyroid nodule   . Unspecified essential hypertension   . Vitamin B12 deficiency     Patient Active Problem List   Diagnosis Date Noted  . Seizures ([place]) 01/07/2019  . Cerebrovascular accident (CVA) due to occlusion of left carotid artery ([place]) 01/07/2019  . CAD, multiple vessel 01/07/2019  . H/O ischemic bowel disease 01/07/2019  . Acute bronchitis 12/04/2018  . Oral phase dysphagia 12/04/2018  . CKD (chronic kidney disease) stage 3, GFR 30-59 ml/min 11/04/2018  . Seizure ([place]) 11/03/2018  . 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 Scar of forehead 01/06/2018  . Traumatic ulceration of tongue 12/04/2017  . Ileostomy in place [facility]) 10/23/2017  . Pressure injury of skin 10/19/2017  . 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  Ileostomy 10/18/2017 10/14/2017  . Ischemic colitis ([place]) 10/14/2017  . Acute respiratory failure with hypoxemia ([place]) 10/14/2017  . Acute post-operative pain 10/14/2017  . AKI (acute kidney injury) ([place]) 10/14/2017  . Anemia 10/14/2017  . Nocturia more than twice per night 05/07/2017  . Diabetes due to underlying condition w oth circulatory comp ([place]) 05/07/2017  . Atherosclerotic PVD with intermittent claudication ([place]) 05/07/2017  . Poor sleep hygiene 05/07/2017  . 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 Hilar lymphadenopathy   . Hemoptysis 02/19/2017  . Pulmonary nodules/lesions, multiple 02/19/2017  . Tobacco use 02/19/2017  . RUQ abdominal pain 01/13/2017  . Left-sided  extracranial carotid artery occlusion 11/26/2016  . PAOD (peripheral arterial occlusive disease) ([place]) 11/26/2016  . Encounter for routine follow-up after surgery of the circulatory system 11/26/2016  . Chest pain with moderate risk of acute coronary syndrome 10/27/2015  . Acid indigestion 12/31/2013  . Pain in left hip 08/26/2013  . Screening for gout 05/13/2013  . Disorder of skin or subcutaneous tissue 12/22/2012  . Difficult or painful urination 09/02/2012  . Peripheral blood vessel disorder ([place]) 05/11/2012  . 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 Follow-up examination, following unspecified surgery 05/11/2012  . Occlusion and stenosis of carotid artery without mention of cerebral infarction 04/14/2012  . Fatigue 09/24/2011  . Gonalgia 07/04/2011  . Avitaminosis D 05/02/2011  . Chronic pancreatitis ([place]) 04/30/2011  . Vitamin B12 deficiency 04/30/2011  . 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 Fatty liver 04/30/2011  . Adiposity 04/30/2011  . Other and unspecified general psychiatric examination 04/26/2011  . Abdominal pain 04/09/2011  . Iron deficiency anemia, unspecified  04/09/2011  . Other general symptoms  04/09/2011  . DM (diabetes mellitus screen) 04/09/2011  . Asthma 04/09/2011  . Type 2 diabetes mellitus with diabetic peripheral angiopathy without gangrene ([place]) 01/24/2011  . Abnormal weight gain 11/30/2010  . Dyslipidemia   . Dizziness   . Right-sided extracranial carotid artery stenosis   . Normal nuclear stress test   . Feeling bilious 07/11/2010  . Underimmunization status 07/11/2010  . Pancreas (digestive gland) works poorly 05/30/2010  . B12 deficiency 03/19/2010  . DVT 03/16/2010  . BLIND LOOP SYNDROME 03/16/2010  . Diarrhea 03/16/2010  . Chronic depression 02/23/2010  . Cough 11/17/2009  . Plantar verruca 11/17/2009  . Arteriosclerosis of coronary artery 11/16/2009  . Claudication ([place]) 11/16/2009  . Compulsive tobacco user syndrome 11/16/2009  . HLD (hyperlipidemia) 11/16/2009  . Nontoxic single thyroid nodule 11/16/2009  . Obstructive apnea 11/16/2009  . Radial styloid tenosynovitis 11/16/2009  . LBP (low back pain) 11/16/2009  . LAXATIVE ABUSE 12/17/2007  . BP (high blood pressure) 12/17/2007  . Acid reflux 12/17/2007  . HIATAL HERNIA 12/17/2007  . IBS 12/17/2007  . MELANOSIS COLI 12/17/2007  . ARTHRITIS 12/17/2007  . HEPATOMEGALY 12/17/2007    Past Surgical History:  Procedure Laterality Date  . ABDOMINAL HYSTERECTOMY     partial  . AUGMENTATION MAMMAPLASTY      bilateral 1976 retro   . BREAST ENHANCEMENT SURGERY    . CARDIAC CATHETERIZATION  07/07/98;08/31/01;03/04/05   60 -70% LAD  . CATARACT EXTRACTION    . COLON RESECTION N/A 10/14/2017   Procedure: EXPLORATORY LAPAROTOMY, EXTENDED RIGHT COLECTOMY; APPLICATION OF ABDOMINAL VACUUM DRESSING;  Surgeon: Jovita Kussmaul, MD;  Location: [facility] ORS;  Service: General;  Laterality: N/A;  . ENDOBRONCHIAL ULTRASOUND Bilateral 02/24/2017   Procedure: ENDOBRONCHIAL ULTRASOUND;  Surgeon: Collene Gobble, MD;  Location: [facility] ENDOSCOPY;  Service: Cardiopulmonary;  Laterality: Bilateral;  . EYE SURGERY Left Nov. 2016   Cataract  . EYE SURGERY Right 2001   Cataract  . FEMORAL ARTERY STENT     Left leg  . LAPAROTOMY N/A 10/16/2017   Procedure: EXPLORATORY LAPAROTOMY, PARTIAL OMENTECTOMY, RESECTION ISCHEMIC ILEUM 102VO, APPLICATION OF VAC ABDOMINAL DRESSING;  Surgeon: Armandina Gemma, MD;  Location: [facility] ORS;  Service: General;  Laterality: N/A;  . LAPAROTOMY N/A 10/18/2017   Procedure: RE-EXPLORATION OF ABDOMEN, ILEOSTOMY CREATION;  Surgeon: Stark Klein, MD;  Location: [facility];  Service: General;  Laterality: N/A;  . LUNG SURGERY  03/2017   Benign polyps removed  . PATELLA  REALIGNMENT Left   . TONSILLECTOMY    . TOTAL ABDOMINAL HYSTERECTOMY    . WRIST SURGERY     right     OB History   No obstetric history on file.      Home Medications    Prior to Admission medications   Medication Sig Start Date End Date Taking? Authorizing Provider  acetaminophen (TYLENOL) 325 MG tablet Take 650 mg by mouth every 6 (six) hours as needed for headache (pain).   Yes [provider]  amLODipine (NORVASC) 10 MG tablet Place 1 tablet (10 mg total) into feeding tube daily. Patient taking differently: Take 10 mg by mouth daily.  11/06/17  Yes Florencia Reasons, MD  ARIPiprazole (ABILIFY) 5 MG tablet Place 1 tablet (5 mg total) into feeding tube daily. Patient taking differently: Take 5 mg by mouth daily.  11/06/17  Yes Florencia Reasons, MD   aspirin EC 81 MG tablet Take 81 mg by mouth daily.   Yes [provider]  atorvastatin (LIPITOR) 20 MG tablet Take 20 mg by mouth daily.   Yes [provider]  clopidogrel (PLAVIX) 75 MG tablet Take 75 mg by mouth daily.   Yes [provider]  diphenoxylate-atropine (LOMOTIL) 2.5-0.025 MG tablet TAKE 1 TABLET BY MOUTH TWICE A DAY Patient taking differently: Take 1 tablet by mouth 2 (two) times daily.  07/14/19  Yes Willia Craze, NP  esomeprazole (NEXIUM) 40 MG capsule Take 40 mg by mouth daily. 07/29/18  Yes [provider]  hydrOXYzine (ATARAX/VISTARIL) 25 MG tablet Take 25 mg by mouth at bedtime as needed (sleep).   Yes [provider]  levETIRAcetam (KEPPRA) 500 MG tablet TAKE 1 TABLET BY MOUTH TWICE A DAY 05/24/19  Yes Dohmeier, Asencion Partridge, MD  loperamide (IMODIUM) 2 MG capsule Take 2 mg by mouth daily as needed for diarrhea or loose stools.    Yes [provider]  losartan (COZAAR) 100 MG tablet Take 1/2 tablet ( 50 mg ) daily 04/27/19  Yes [place]ique, Peter M, MD  metoprolol succinate (TOPROL-XL) 100 MG 24 hr tablet Take 100 mg by mouth daily. Take with or immediately following a meal.   Yes [provider]  mirtazapine (REMERON) 15 MG tablet Take 15 mg by mouth at bedtime.   Yes [provider]  montelukast (SINGULAIR) 10 MG tablet Take 10 mg by mouth daily. 08/09/18  Yes [provider]  nitroGLYCERIN (NITROSTAT) 0.4 MG SL tablet Place 1 tablet (0.4 mg total) under the tongue every 5 (five) minutes as needed for chest pain. 11/08/15  Yes [place], Peter M, MD  Pancrelipase, Lip-Prot-Amyl, (ZENPEP) [phone] units CPEP Take 1 capsule by mouth 3 (three) times daily with meals.   Yes [provider]  pantoprazole (PROTONIX) 40 MG tablet Take 40 mg by mouth daily.   Yes [provider]  venlafaxine XR (EFFEXOR-XR) 150 MG 24 hr capsule Take 150 mg by mouth daily.   Yes [provider]  vitamin  B-12 (CYANOCOBALAMIN) 500 MCG tablet Take 500 mcg by mouth daily.   Yes [provider]  Vitamin D, Ergocalciferol, (DRISDOL) 1.25 MG (50000 UT) CAPS capsule Take 50,000 Units by mouth every Wednesday.    Yes [provider]    Family History Family History  Problem Relation Age of Onset  . Hypertension Mother   . Osteoarthritis Mother   . Pulmonary embolism Mother   . Hypertension Father        aneurysm  . Stroke Father   .  Stroke Other   . Colon cancer Paternal Grandfather   . Breast cancer Neg Hx     Social History Social History   Tobacco Use  . Smoking status: Former Smoker    Packs/day: 1.00    Years: 50.00    Pack years: 50.00    Types: Cigarettes    Quit date: 10/14/2010    Years since quitting: 8.8  . Smokeless tobacco: Never Used  Substance Use Topics  . Alcohol use: No  . Drug use: No     Allergies   Adhesive [tape], Codeine, and Iodinated diagnostic agents   Review of Systems Review of Systems  Constitutional: Positive for activity change.  Gastrointestinal: Positive for abdominal pain and diarrhea.  Allergic/Immunologic: Negative for immunocompromised state.  Neurological: Positive for dizziness.  All other systems reviewed and are negative.    Physical Exam Updated Vital Signs BP (!) 139/52 (BP Location: Right Arm)   Pulse (!) 59   Temp 98.6 F (37 C) (Oral)   Resp 16   Ht 5\' 3"  (1.6 m)   Wt 79 kg   SpO2 97%   BMI 30.85 kg/m   Physical Exam Vitals signs and nursing note reviewed.  Constitutional:      Appearance: She is well-developed.  HENT:     Head: Normocephalic and atraumatic.  Neck:     Musculoskeletal: Normal range of motion and neck supple.  Cardiovascular:     Rate and Rhythm: Normal rate.  Pulmonary:     Effort: Pulmonary effort is normal.  Abdominal:     General: Bowel sounds are normal.     Tenderness: There is abdominal tenderness. There is no guarding or rebound.  Skin:    General: Skin is warm  and dry.  Neurological:     Mental Status: She is alert and oriented to person, place, and time.      ED Treatments / Results  Labs (all labs ordered are listed, but only abnormal results are displayed) Labs Reviewed  BASIC METABOLIC PANEL - Abnormal; Notable for the following components:      Result Value   Sodium 128 (*)    Potassium 5.2 (*)    Chloride 97 (*)    CO2 19 (*)    Glucose, Bld 133 (*)    BUN 40 (*)    Creatinine, Ser 3.64 (*)    GFR calc non Af Amer 12 (*)    GFR calc Af Amer 14 (*)    All other components within normal limits  CBC - Abnormal; Notable for the following components:   WBC 12.4 (*)    All other components within normal limits  URINALYSIS, ROUTINE W REFLEX MICROSCOPIC - Abnormal; Notable for the following components:   APPearance HAZY (*)    Ketones, ur 15 (*)    Leukocytes,Ua SMALL (*)    All other components within normal limits  URINALYSIS, MICROSCOPIC (REFLEX) - Abnormal; Notable for the following components:   Bacteria, UA MANY (*)    All other components within normal limits  CBC WITH DIFFERENTIAL/PLATELET - Abnormal; Notable for the following components:   WBC 11.8 (*)    HCT 35.9 (*)    Abs Immature Granulocytes 0.09 (*)    All other components within normal limits  BASIC METABOLIC PANEL - Abnormal; Notable for the following components:   Sodium 126 (*)    Potassium 5.6 (*)    CO2 15 (*)    Glucose, Bld 102 (*)    BUN 45 (*)  Creatinine, Ser 3.40 (*)    GFR calc non Af Amer 13 (*)    GFR calc Af Amer 15 (*)    All other components within normal limits  BASIC METABOLIC PANEL - Abnormal; Notable for the following components:   Potassium 5.2 (*)    CO2 20 (*)    BUN 34 (*)    Creatinine, Ser 2.17 (*)    Calcium 8.8 (*)    GFR calc non Af Amer 22 (*)    GFR calc Af Amer 26 (*)    All other components within normal limits  CBC - Abnormal; Notable for the following components:   RBC 3.52 (*)    Hemoglobin 10.5 (*)    HCT  31.4 (*)    All other components within normal limits  SARS CORONAVIRUS 2 (TAT 6-24 HRS)    EKG EKG Interpretation  Date/Time:  Wednesday August 25 2019 10:25:11 EST Ventricular Rate:  73 PR Interval:    QRS Duration: 112 QT Interval:  380 QTC Calculation: 419 R Axis:   50 Text Interpretation: Sinus rhythm Borderline intraventricular conduction delay Low voltage, precordial leads No acute changes No significant change since last tracing Confirmed by Varney Biles [phone]) on 08/26/2019 1:27:05 PM   Radiology Dg Chest 2 View  Result Date: 08/24/2019 CLINICAL DATA:  Shortness of breath EXAM: CHEST - 2 VIEW COMPARISON:  11/17/2017 FINDINGS: The heart size and mediastinal contours are within normal limits. Both lungs are clear. Aortic atherosclerosis. The visualized skeletal structures are unremarkable. IMPRESSION: No active cardiopulmonary disease. Electronically Signed   By: Donavan Foil M.D.   On: 08/24/2019 17:49    Procedures Procedures (including critical care time)  Medications Ordered in ED Medications  sodium chloride flush (NS) 0.9 % injection 3 mL (has no administration in time range)  acetaminophen (TYLENOL) tablet 650 mg (has no administration in time range)  amLODipine (NORVASC) tablet 10 mg (10 mg Oral Given 08/26/19 0847)  atorvastatin (LIPITOR) tablet 20 mg (20 mg Oral Given 08/26/19 0845)  metoprolol succinate (TOPROL-XL) 24 hr tablet 100 mg (100 mg Oral Given 08/25/19 1854)  ARIPiprazole (ABILIFY) tablet 5 mg (5 mg Oral Given 08/26/19 0845)  mirtazapine (REMERON) tablet 15 mg (15 mg Oral Given 08/25/19 2121)  venlafaxine XR (EFFEXOR-XR) 24 hr capsule 150 mg (150 mg Oral Given 08/26/19 1137)  pantoprazole (PROTONIX) EC tablet 40 mg (40 mg Oral Given 08/26/19 0845)  clopidogrel (PLAVIX) tablet 75 mg (75 mg Oral Given 08/26/19 0846)  vitamin B-12 (CYANOCOBALAMIN) tablet 500 mcg (500 mcg Oral Given 08/26/19 0844)  levETIRAcetam (KEPPRA) tablet 500 mg (500 mg  Oral Given 08/26/19 0845)  montelukast (SINGULAIR) tablet 10 mg (10 mg Oral Given 08/26/19 0845)  heparin injection 5,000 Units (5,000 Units Subcutaneous Not Given 08/26/19 0614)  0.9 %  sodium chloride infusion ( Intravenous Rate/Dose Change 08/26/19 1142)  ondansetron (ZOFRAN) tablet 4 mg (has no administration in time range)    Or  ondansetron (ZOFRAN) injection 4 mg (has no administration in time range)  diphenoxylate-atropine (LOMOTIL) 2.5-0.025 MG per tablet 1 tablet (1 tablet Oral Given 08/26/19 0846)  Opium 10 MG/ML (1%) tincture 5 mg (has no administration in time range)  lactated ringers bolus 1,000 mL (0 mLs Intravenous Stopped 08/25/19 1712)  lactated ringers bolus 1,000 mL (1,000 mLs Intravenous New Bag/Given 08/25/19 1805)     Initial Impression / Assessment and Plan / ED Course  I have reviewed the triage vital signs and the nursing notes.  Pertinent labs &  imaging results that were available during my care of the patient were reviewed by me and considered in my medical decision making (see chart for details).        73 year old comes in a chief complaint of weakness, dizziness.  She appears to be dehydrated secondary to significant volume loss from her colostomy tube.  She has no new abdominal pain and her abdominal exam is benign.  No fevers, chills.  She has acute on chronic renal failure on labs with mild hyperkalemia that does not have any EKG changes.  She will need admission.  1 L of LR ordered by the admitting staff. Unclear why she has diarrhea but it does not seem secondary to C. difficile infection or COVID-19.  MADONNA FLEGAL was evaluated in Emergency Department on 08/26/2019 for the symptoms described in the history of present illness. She was evaluated in the context of the global COVID-19 pandemic, which necessitated consideration that the patient might be at risk for infection with the SARS-CoV-2 virus that causes COVID-19. Institutional protocols and  algorithms that pertain to the evaluation of patients at risk for COVID-19 are in a state of rapid change based on information released by regulatory bodies including the CDC and federal and state organizations. These policies and algorithms were followed during the patient's care in the ED.   Final Clinical Impressions(s) / ED Diagnoses   Final diagnoses:  Acute renal failure superimposed on stage 5 chronic kidney disease, not on chronic dialysis, unspecified acute renal failure type [facility])  Acute hyperkalemia    ED Discharge Orders    None       Varney Biles, MD 08/26/19 1329

## 2019-08-25 NOTE — Progress Notes (Signed)
Patient arrived to room 5M13 via stretcher from the ED. Patient is alert and oriented times 4. Belongings and call bell within reach. Bed is in the lowest and locked position with bed rails up times 2. Telemetry box 16 applied and TMU contacted.

## 2019-08-26 DIAGNOSIS — Z7982 Long term (current) use of aspirin: Secondary | ICD-10-CM | POA: Diagnosis not present

## 2019-08-26 DIAGNOSIS — E875 Hyperkalemia: Secondary | ICD-10-CM | POA: Diagnosis present

## 2019-08-26 DIAGNOSIS — F419 Anxiety disorder, unspecified: Secondary | ICD-10-CM | POA: Diagnosis present

## 2019-08-26 DIAGNOSIS — E1122 Type 2 diabetes mellitus with diabetic chronic kidney disease: Secondary | ICD-10-CM | POA: Diagnosis present

## 2019-08-26 DIAGNOSIS — K902 Blind loop syndrome, not elsewhere classified: Secondary | ICD-10-CM | POA: Diagnosis present

## 2019-08-26 DIAGNOSIS — K580 Irritable bowel syndrome with diarrhea: Secondary | ICD-10-CM | POA: Diagnosis present

## 2019-08-26 DIAGNOSIS — K559 Vascular disorder of intestine, unspecified: Secondary | ICD-10-CM | POA: Diagnosis present

## 2019-08-26 DIAGNOSIS — Z933 Colostomy status: Secondary | ICD-10-CM | POA: Diagnosis not present

## 2019-08-26 DIAGNOSIS — F329 Major depressive disorder, single episode, unspecified: Secondary | ICD-10-CM | POA: Diagnosis present

## 2019-08-26 DIAGNOSIS — G40909 Epilepsy, unspecified, not intractable, without status epilepticus: Secondary | ICD-10-CM | POA: Diagnosis present

## 2019-08-26 DIAGNOSIS — K219 Gastro-esophageal reflux disease without esophagitis: Secondary | ICD-10-CM | POA: Diagnosis present

## 2019-08-26 DIAGNOSIS — I129 Hypertensive chronic kidney disease with stage 1 through stage 4 chronic kidney disease, or unspecified chronic kidney disease: Secondary | ICD-10-CM | POA: Diagnosis present

## 2019-08-26 DIAGNOSIS — E1151 Type 2 diabetes mellitus with diabetic peripheral angiopathy without gangrene: Secondary | ICD-10-CM | POA: Diagnosis present

## 2019-08-26 DIAGNOSIS — K5790 Diverticulosis of intestine, part unspecified, without perforation or abscess without bleeding: Secondary | ICD-10-CM | POA: Diagnosis present

## 2019-08-26 DIAGNOSIS — E538 Deficiency of other specified B group vitamins: Secondary | ICD-10-CM | POA: Diagnosis present

## 2019-08-26 DIAGNOSIS — I2510 Atherosclerotic heart disease of native coronary artery without angina pectoris: Secondary | ICD-10-CM | POA: Diagnosis present

## 2019-08-26 DIAGNOSIS — Z20828 Contact with and (suspected) exposure to other viral communicable diseases: Secondary | ICD-10-CM | POA: Diagnosis present

## 2019-08-26 DIAGNOSIS — N179 Acute kidney failure, unspecified: Secondary | ICD-10-CM | POA: Diagnosis present

## 2019-08-26 DIAGNOSIS — E785 Hyperlipidemia, unspecified: Secondary | ICD-10-CM | POA: Diagnosis present

## 2019-08-26 DIAGNOSIS — J439 Emphysema, unspecified: Secondary | ICD-10-CM | POA: Diagnosis present

## 2019-08-26 DIAGNOSIS — Z932 Ileostomy status: Secondary | ICD-10-CM | POA: Diagnosis not present

## 2019-08-26 DIAGNOSIS — K861 Other chronic pancreatitis: Secondary | ICD-10-CM | POA: Diagnosis present

## 2019-08-26 DIAGNOSIS — K760 Fatty (change of) liver, not elsewhere classified: Secondary | ICD-10-CM | POA: Diagnosis present

## 2019-08-26 DIAGNOSIS — N1831 Chronic kidney disease, stage 3a: Secondary | ICD-10-CM | POA: Diagnosis present

## 2019-08-26 LAB — CBC
HCT: 31.4 % — ABNORMAL LOW (ref 36.0–46.0)
Hemoglobin: 10.5 g/dL — ABNORMAL LOW (ref 12.0–15.0)
MCH: 29.8 pg (ref 26.0–34.0)
MCHC: 33.4 g/dL (ref 30.0–36.0)
MCV: 89.2 fL (ref 80.0–100.0)
Platelets: 228 10*3/uL (ref 150–400)
RBC: 3.52 MIL/uL — ABNORMAL LOW (ref 3.87–5.11)
RDW: 13 % (ref 11.5–15.5)
WBC: 8 10*3/uL (ref 4.0–10.5)
nRBC: 0 % (ref 0.0–0.2)

## 2019-08-26 LAB — BASIC METABOLIC PANEL
Anion gap: 9 (ref 5–15)
BUN: 34 mg/dL — ABNORMAL HIGH (ref 8–23)
CO2: 20 mmol/L — ABNORMAL LOW (ref 22–32)
Calcium: 8.8 mg/dL — ABNORMAL LOW (ref 8.9–10.3)
Chloride: 107 mmol/L (ref 98–111)
Creatinine, Ser: 2.17 mg/dL — ABNORMAL HIGH (ref 0.44–1.00)
GFR calc Af Amer: 26 mL/min — ABNORMAL LOW (ref >60)
GFR calc non Af Amer: 22 mL/min — ABNORMAL LOW (ref >60)
Glucose, Bld: 91 mg/dL (ref 70–99)
Potassium: 5.2 mmol/L — ABNORMAL HIGH (ref 3.5–5.1)
Sodium: 136 mmol/L (ref 135–145)

## 2019-08-26 MED ORDER — OPIUM 10 MG/ML (1%) PO TINC
5.0000 mg | Freq: Two times a day (BID) | ORAL | Status: DC
  Administered 2019-08-26 – 2019-08-27 (×3): 5 mg via ORAL
  Filled 2019-08-26: qty 0.5
  Filled 2019-08-26 (×2): qty 1

## 2019-08-26 MED ORDER — DIPHENOXYLATE-ATROPINE 2.5-0.025 MG PO TABS
1.0000 | ORAL_TABLET | Freq: Four times a day (QID) | ORAL | Status: DC
  Administered 2019-08-26 – 2019-08-27 (×5): 1 via ORAL
  Filled 2019-08-26 (×5): qty 1

## 2019-08-26 NOTE — Progress Notes (Signed)
PROGRESS NOTE    [name]  WER:[id number] DOB: 1946/04/06 DOA: 08/24/2019 PCP: Leanna Battles, MD  Brief Narrative:  [name] is a 73 y.o. 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 She has poor appetite and having loose stools with increased frequency and amount since last 2 weeks, which happens occasionally. -She denies any fevers or chills, no abdominal pain -In the ED WBC count is 12.4.  Sodium 128, potassium 5.2.  Creatinine is 3.64.   Assessment & Plan:   1.  Acute kidney injury with history of stage IIIa chronic kidney disease: With mild hyperkalemia -Prerenal from high output ileostomy and ongoing ARB use  -Baseline creatinine is 1.7, creatinine on admission was 3.6, this is improving with hydration, continue IV fluids, normal saline at 100 cc an hour for today  -Would recommend discontinuing losartan at discharge due to high propensity for volume depletion and renal insufficiency in the future   2.   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  Diet-controlled diabetes -stable, sliding scale insulin  4.  Coronary artery disease:  -Stable, continue beta-blockers and Plavix   5.  Seizure disorder:  -Stable, continue Keppra   DVT prophylaxis: Heparin subcu Code Status: Full code Family Communication: None Disposition Plan: Observation    Procedures:   Antimicrobials:    Subjective: -Feels better, more stronger today,  reports frequent ostomy output every couple of hours for months, denies any fevers or chills, denies any abdominal pain  Objective: Vitals:   08/25/19 1755 08/25/19 2014 08/26/19 0600 08/26/19 0900  BP: (!) 190/86 137/63 (!) 155/59 (!) 142/75  Pulse: 93 82 (!) 56 (!) 59  Resp: 18 20 16 16   Temp: 97.8 F (36.6 C) 97.6 F (36.4 C) 98.1 F (36.7 C) 98.6 F (37 C)  TempSrc: Oral Oral Oral Oral  SpO2: 98% 97% 96% 93%  Weight: 79.2 kg 79 kg    Height: 5\' 3"  (1.6 m)       Intake/Output Summary (Last 24 hours) at 08/26/2019 1205 Last data filed at 08/26/2019 1141 Gross per 24 hour  Intake 2240.88 ml  Output 675 ml  Net 1565.88 ml   Filed Weights   08/25/19 1755 08/25/19 2014  Weight: 79.2 kg 79 kg    Examination:  General exam: AAOx3, no distress Respiratory system: Clear Cardiovascular system: S1 & S2 heard, RRR. No JVD, murmurs, rubs, gallops Gastrointestinal system: Obese soft multiple surgical scars, ileostomy with liquid brown stool Central nervous system: Alert and oriented. No focal neurological deficits. Extremities: No edema Skin: No rashes, lesions or ulcers Psychiatry:  Mood & affect appropriate.     Data Reviewed:   CBC: Recent Labs  Lab 08/24/19 1646 08/25/19 1343 08/26/19 0522  WBC 12.4* 11.8* 8.0  NEUTROABS  --  7.3  --   HGB 12.2 12.0 10.5*  HCT 36.9 35.9* 31.4*  MCV 90.4 90.2 89.2  PLT 275 257 195   Basic Metabolic Panel: Recent  Labs  Lab 08/24/19 1646 08/25/19 1343 08/26/19 0522  NA 128* 126* 136  K 5.2* 5.6* 5.2*  CL 97* 100 107  CO2 19* 15* 20*  GLUCOSE 133* 102* 91  BUN 40* 45* 34*  CREATININE 3.64* 3.40* 2.17*  CALCIUM 9.5 9.3 8.8*   GFR: Estimated Creatinine Clearance: 23.3 mL/min (A) (by C-G formula based on SCr of 2.17 mg/dL (H)). Liver Function Tests: No results for input(s): AST, ALT, ALKPHOS, BILITOT, PROT, ALBUMIN in the last 168  hours. No results for input(s): LIPASE, AMYLASE in the last 168 hours. No results for input(s): AMMONIA in the last 168 hours. Coagulation Profile: No results for input(s): INR, PROTIME in the last 168 hours. Cardiac Enzymes: No results for input(s): CKTOTAL, CKMB, CKMBINDEX, TROPONINI in the last 168 hours. BNP (last 3 results) No results for input(s): PROBNP in the last 8760 hours. HbA1C: No results for input(s): HGBA1C in the last 72 hours. CBG: No results for input(s): GLUCAP in the last 168 hours. Lipid Profile: No results for input(s): CHOL, HDL, LDLCALC, TRIG, CHOLHDL, LDLDIRECT in the last 72 hours. Thyroid Function Tests: No results for input(s): TSH, T4TOTAL, FREET4, T3FREE, THYROIDAB in the last 72 hours. Anemia Panel: No results for input(s): VITAMINB12, FOLATE, FERRITIN, TIBC, IRON, RETICCTPCT in the last 72 hours. Urine analysis:    Component Value Date/Time   COLORURINE YELLOW 08/24/2019 1830   APPEARANCEUR HAZY (A) 08/24/2019 1830   LABSPEC 1.025 08/24/2019 1830   PHURINE 5.0 08/24/2019 1830   GLUCOSEU NEGATIVE 08/24/2019 1830   HGBUR NEGATIVE 08/24/2019 1830   [place] 08/24/2019 1830   KETONESUR 15 (A) 08/24/2019 1830   PROTEINUR NEGATIVE 08/24/2019 1830   NITRITE NEGATIVE 08/24/2019 1830   LEUKOCYTESUR SMALL (A) 08/24/2019 1830   Sepsis Labs: @LABRCNTIP (procalcitonin:4,lacticidven:4)  ) Recent Results (from the past 240 hour(s))  SARS CORONAVIRUS 2 (TAT 6-24 HRS) Nasopharyngeal Nasopharyngeal Swab     Status: None   Collection Time: 08/25/19 10:40 AM   Specimen: Nasopharyngeal Swab  Result Value Ref Range Status   SARS Coronavirus 2 NEGATIVE NEGATIVE Final    Comment: (NOTE) SARS-CoV-2 target nucleic acids are NOT DETECTED. The SARS-CoV-2 RNA is generally detectable in upper and lower respiratory specimens during the acute phase of infection. Negative results do not preclude SARS-CoV-2 infection, do not rule out co-infections with other  pathogens, and should not be used as the sole basis for treatment or other patient management decisions. Negative results must be combined with clinical observations, patient history, and epidemiological information. The expected result is Negative. Fact Sheet for Patients: SugarRoll.be Fact Sheet for Healthcare Providers: https://www.woods-mathews.com/ This test is not yet approved or cleared by the [place] FDA and  has been authorized for detection and/or diagnosis of SARS-CoV-2 by FDA under an Emergency Use Authorization (EUA). This EUA will remain  in effect (meaning this test can be used) for the duration of the COVID-19 declaration under Section 56 4(b)(1) of the Act, 21 U.S.C. section 360bbb-3(b)(1), unless the authorization is terminated or revoked sooner. Performed at [facility], [place] [address], [place], [place] [address]          Radiology Studies: Dg Chest 2 View  Result Date: 08/24/2019 CLINICAL DATA:  Shortness of breath EXAM: CHEST - 2 VIEW COMPARISON:  11/17/2017 FINDINGS: The heart size and mediastinal contours are within normal limits. Both lungs are clear. Aortic atherosclerosis. The visualized skeletal structures are unremarkable. IMPRESSION: No active cardiopulmonary disease. Electronically Signed   By: Donavan Foil  M.D.   On: 08/24/2019 17:49        Scheduled Meds: . amLODipine  10 mg Oral Daily  . ARIPiprazole  5 mg Oral Daily  . atorvastatin  20 mg Oral Daily  . clopidogrel  75 mg Oral Daily  . diphenoxylate-atropine  1 tablet Oral QID  . heparin  5,000 Units Subcutaneous Q8H  . levETIRAcetam  500 mg Oral BID  . metoprolol succinate  100 mg Oral Q supper  . mirtazapine  15 mg Oral QHS  . montelukast  10 mg Oral Daily  . Opium  5 mg Oral BID  . pantoprazole  40 mg Oral Daily  . sodium chloride flush  3 mL Intravenous Once  . venlafaxine XR  150 mg Oral Daily  . vitamin B-12  500 mcg Oral  Daily   Continuous Infusions: . sodium chloride 100 mL/hr at 08/26/19 1142     LOS: 0 days    Time spent: 67min    Domenic Polite, MD [facility]  08/26/2019, 12:05 PM

## 2019-08-26 NOTE — Plan of Care (Signed)
  Problem: Education: Goal: Knowledge of General Education information will improve Description Including pain rating scale, medication(s)/side effects and non-pharmacologic comfort measures Outcome: Progressing   

## 2019-08-27 LAB — BASIC METABOLIC PANEL
Anion gap: 6 (ref 5–15)
BUN: 23 mg/dL (ref 8–23)
CO2: 20 mmol/L — ABNORMAL LOW (ref 22–32)
Calcium: 8.4 mg/dL — ABNORMAL LOW (ref 8.9–10.3)
Chloride: 112 mmol/L — ABNORMAL HIGH (ref 98–111)
Creatinine, Ser: 1.5 mg/dL — ABNORMAL HIGH (ref 0.44–1.00)
GFR calc Af Amer: 40 mL/min — ABNORMAL LOW (ref >60)
GFR calc non Af Amer: 34 mL/min — ABNORMAL LOW (ref >60)
Glucose, Bld: 95 mg/dL (ref 70–99)
Potassium: 4.9 mmol/L (ref 3.5–5.1)
Sodium: 138 mmol/L (ref 135–145)

## 2019-08-27 LAB — CBC
HCT: 30.1 % — ABNORMAL LOW (ref 36.0–46.0)
Hemoglobin: 9.8 g/dL — ABNORMAL LOW (ref 12.0–15.0)
MCH: 29.9 pg (ref 26.0–34.0)
MCHC: 32.6 g/dL (ref 30.0–36.0)
MCV: 91.8 fL (ref 80.0–100.0)
Platelets: 196 10*3/uL (ref 150–400)
RBC: 3.28 MIL/uL — ABNORMAL LOW (ref 3.87–5.11)
RDW: 13.4 % (ref 11.5–15.5)
WBC: 8.2 10*3/uL (ref 4.0–10.5)
nRBC: 0 % (ref 0.0–0.2)

## 2019-08-27 MED ORDER — DIPHENOXYLATE-ATROPINE 2.5-0.025 MG PO TABS: 1.0000 | ORAL_TABLET | Freq: Three times a day (TID) | ORAL | 1 refills | Status: DC

## 2019-08-27 NOTE — Plan of Care (Signed)
  Problem: Education: Goal: Knowledge of General Education information will improve Description: Including pain rating scale, medication(s)/side effects and non-pharmacologic comfort measures Outcome: Completed/Met   Problem: Health Behavior/Discharge Planning: Goal: Ability to manage health-related needs will improve Outcome: Completed/Met   Problem: Clinical Measurements: Goal: Ability to maintain clinical measurements within normal limits will improve Outcome: Completed/Met Goal: Will remain free from infection Outcome: Completed/Met Goal: Diagnostic test results will improve Outcome: Completed/Met Goal: Respiratory complications will improve Outcome: Completed/Met Goal: Cardiovascular complication will be avoided Outcome: Completed/Met   Problem: Coping: Goal: Level of anxiety will decrease Outcome: Completed/Met   Problem: Pain Managment: Goal: General experience of comfort will improve Outcome: Completed/Met   Problem: Skin Integrity: Goal: Risk for impaired skin integrity will decrease Outcome: Completed/Met

## 2019-08-27 NOTE — Discharge Summary (Signed)
Physician Discharge Summary  [name]:[id number] DOB: 05-Aug-1946 DOA: 08/24/2019  PCP: Leanna Battles, MD  Admit date: 08/24/2019 Discharge date: 08/27/2019  Time spent: 35 minutes  Recommendations for Outpatient Follow-up:  PCP in 1 week, ARB discontinued, Lomotil dose increased  Discharge Diagnoses:  Principal Problem:   AKI (acute kidney injury) ([place]) Active Problems:   Diarrhea   Dyslipidemia   Intestinal motility disorder   Seizure ([place])   CKD (chronic kidney disease) stage 3, GFR 30-59 ml/min   Discharge Condition: Stable  Diet recommendation: Diabetic  Filed Weights   08/25/19 1755 08/25/19 2014 08/26/19 2239  Weight: 79.2 kg 79 kg 79.5 kg    History of present illness:  Joanna Mower Rumleyis a 73 y.o.femalewith medical history significant ofanxiety, coronary artery disease, depression, type 2 diabetes currently off medications, 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 She has poor appetite and having loose stools with increased frequency and amount since last 2 weeks, which happens occasionally. -She denies any fevers or chills, no abdominal pain -In the EDWBC count is 12.4. Sodium 128, potassium 5.2. Creatinine is 3.64.    Hospital Course:   1.Acute kidney injury with history of stage IIIachronic kidney disease: With mild hyperkalemia -Prerenal from high output ileostomy and ongoing ARB use  -Baseline creatinine is 1.7, creatinine on admission was 3.6, this is improving with hydration, down to 1.5 -Continue losartan at discharge  2.  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, blind loop syndrome, ileostomy -High ileostomy output at baseline  -Recent CT abdomen pelvis 11/6 was benign -Not very compliant with Lomotil, advised importance of this,  -Scheduled  Lomotil to 3 times a day, she was only taking this once a day sometimes -Advised close follow-up with PCP and consider adding tincture of opium twice daily if needed  3.Diet-controlled diabetes -stable  4.Coronary artery disease:  -Stable, continue beta-blockers and Plavix   5.Seizure disorder:  -Stable, continue Keppra   Discharge Exam: Vitals:   08/27/19 0541 08/27/19 0810  BP: (!) 151/60 (!) 174/63  Pulse: 76 84  Resp: 16 16  Temp: 98 F (36.7 C) 97.8 F (36.6 C)  SpO2: 96% 95%    General: AAOx3 Cardiovascular: S1S2/RRR Respiratory: CTAB  Discharge Instructions   Discharge Instructions    Diet - low sodium heart healthy   Complete by: As directed    Increase activity slowly   Complete by: As directed      Allergies as of 08/27/2019      Reactions   Adhesive [tape] Other (See Comments)   "Took off my skin"   Codeine Other (See Comments)   Altered mental state    Iodinated Diagnostic Agents Rash      Medication List    STOP taking these medications   losartan 100 MG tablet Commonly known as: COZAAR     TAKE these medications   acetaminophen 325 MG tablet Commonly known as: TYLENOL Take 650 mg by mouth every 6 (six) hours as needed for headache (pain).   amLODipine 10 MG tablet Commonly known as: NORVASC Place 1 tablet (10 mg total) into feeding tube daily. What changed: how to take this   ARIPiprazole 5 MG tablet Commonly known as: Abilify Place 1 tablet (5 mg total) into feeding tube daily. What changed: how to take this   aspirin EC 81 MG tablet Take 81  mg by mouth daily.   atorvastatin 20 MG tablet Commonly known as: LIPITOR Take 20 mg by mouth daily.   clopidogrel 75 MG tablet Commonly known as: PLAVIX Take 75 mg by mouth daily.   diphenoxylate-atropine 2.5-0.025 MG tablet Commonly known as: LOMOTIL Take 1 tablet by mouth 3 (three) times daily. What changed: when to take this   esomeprazole 40 MG capsule Commonly  known as: NEXIUM Take 40 mg by mouth daily.   hydrOXYzine 25 MG tablet Commonly known as: ATARAX/VISTARIL Take 25 mg by mouth at bedtime as needed (sleep).   levETIRAcetam 500 MG tablet Commonly known as: KEPPRA TAKE 1 TABLET BY MOUTH TWICE A DAY   loperamide 2 MG capsule Commonly known as: IMODIUM Take 2 mg by mouth daily as needed for diarrhea or loose stools.   metoprolol succinate 100 MG 24 hr tablet Commonly known as: TOPROL-XL Take 100 mg by mouth daily. Take with or immediately following a meal.   mirtazapine 15 MG tablet Commonly known as: REMERON Take 15 mg by mouth at bedtime.   montelukast 10 MG tablet Commonly known as: SINGULAIR Take 10 mg by mouth daily.   nitroGLYCERIN 0.4 MG SL tablet Commonly known as: NITROSTAT Place 1 tablet (0.4 mg total) under the tongue every 5 (five) minutes as needed for chest pain.   pantoprazole 40 MG tablet Commonly known as: PROTONIX Take 40 mg by mouth daily.   venlafaxine XR 150 MG 24 hr capsule Commonly known as: EFFEXOR-XR Take 150 mg by mouth daily.   vitamin B-12 500 MCG tablet Commonly known as: CYANOCOBALAMIN Take 500 mcg by mouth daily.   Vitamin D (Ergocalciferol) 1.25 MG (50000 UT) Caps capsule Commonly known as: DRISDOL Take 50,000 Units by mouth every Wednesday.   Zenpep 40000-126000 units Cpep Generic drug: Pancrelipase (Lip-Prot-Amyl) Take 1 capsule by mouth 3 (three) times daily with meals.      Allergies  Allergen Reactions  . Adhesive [Tape] Other (See Comments)    "Took off my skin"  . Codeine Other (See Comments)    Altered mental state   . Iodinated Diagnostic Agents Rash   Follow-up Information    Leanna Battles, MD. Schedule an appointment as soon as possible for a visit in 1 week(s).   Specialty: Internal Medicine Contact information: [address] [address] [phone]            The results of significant diagnostics from this hospitalization (including  imaging, microbiology, ancillary and laboratory) are listed below for reference.    Significant Diagnostic Studies: Dg Chest 2 View  Result Date: 08/24/2019 CLINICAL DATA:  Shortness of breath EXAM: CHEST - 2 VIEW COMPARISON:  11/17/2017 FINDINGS: The heart size and mediastinal contours are within normal limits. Both lungs are clear. Aortic atherosclerosis. The visualized skeletal structures are unremarkable. IMPRESSION: No active cardiopulmonary disease. Electronically Signed   By: Donavan Foil M.D.   On: 08/24/2019 17:49    Microbiology: Recent Results (from the past 240 hour(s))  SARS CORONAVIRUS 2 (TAT 6-24 HRS) Nasopharyngeal Nasopharyngeal Swab     Status: None   Collection Time: 08/25/19 10:40 AM   Specimen: Nasopharyngeal Swab  Result Value Ref Range Status   SARS Coronavirus 2 NEGATIVE NEGATIVE Final    Comment: (NOTE) SARS-CoV-2 target nucleic acids are NOT DETECTED. The SARS-CoV-2 RNA is generally detectable in upper and lower respiratory specimens during the acute phase of infection. Negative results do not preclude SARS-CoV-2 infection, do not rule out co-infections with other pathogens,  and should not be used as the sole basis for treatment or other patient management decisions. Negative results must be combined with clinical observations, patient history, and epidemiological information. The expected result is Negative. Fact Sheet for Patients: SugarRoll.be Fact Sheet for Healthcare Providers: https://www.woods-mathews.com/ This test is not yet approved or cleared by the [place] FDA and  has been authorized for detection and/or diagnosis of SARS-CoV-2 by FDA under an Emergency Use Authorization (EUA). This EUA will remain  in effect (meaning this test can be used) for the duration of the COVID-19 declaration under Section 56 4(b)(1) of the Act, 21 U.S.C. section 360bbb-3(b)(1), unless the authorization is terminated  or revoked sooner. Performed at [facility], [place] [address], [place], [place] [address]      Labs: Basic Metabolic Panel: Recent Labs  Lab 08/24/19 1646 08/25/19 1343 08/26/19 0522 08/27/19 0514  NA 128* 126* 136 138  K 5.2* 5.6* 5.2* 4.9  CL 97* 100 107 112*  CO2 19* 15* 20* 20*  GLUCOSE 133* 102* 91 95  BUN 40* 45* 34* 23  CREATININE 3.64* 3.40* 2.17* 1.50*  CALCIUM 9.5 9.3 8.8* 8.4*   Liver Function Tests: No results for input(s): AST, ALT, ALKPHOS, BILITOT, PROT, ALBUMIN in the last 168 hours. No results for input(s): LIPASE, AMYLASE in the last 168 hours. No results for input(s): AMMONIA in the last 168 hours. CBC: Recent Labs  Lab 08/24/19 1646 08/25/19 1343 08/26/19 0522 08/27/19 0514  WBC 12.4* 11.8* 8.0 8.2  NEUTROABS  --  7.3  --   --   HGB 12.2 12.0 10.5* 9.8*  HCT 36.9 35.9* 31.4* 30.1*  MCV 90.4 90.2 89.2 91.8  PLT 275 257 228 196   Cardiac Enzymes: No results for input(s): CKTOTAL, CKMB, CKMBINDEX, TROPONINI in the last 168 hours. BNP: BNP (last 3 results) No results for input(s): BNP in the last 8760 hours.  ProBNP (last 3 results) No results for input(s): PROBNP in the last 8760 hours.  CBG: No results for input(s): GLUCAP in the last 168 hours.     Signed:  Domenic Polite MD.  [facility] 08/27/2019, 2:53 PM

## 2019-08-27 NOTE — Progress Notes (Signed)
DISCHARGE NOTE HOME [name] to be discharged Home per MD order. Discussed prescriptions and follow up appointments with the patient. Prescriptions given to patient; medication list explained in detail. Patient verbalized understanding.  Skin clean, dry and intact without evidence of skin break down, no evidence of skin tears noted. IV catheter discontinued intact. Site without signs and symptoms of complications. Dressing and pressure applied. Pt denies pain at the site currently. No complaints noted.  Patient free of lines, drains, and wounds.   An After Visit Summary (AVS) was printed and given to the patient. Patient escorted via wheelchair, and discharged home via private auto.  Arlyss Repress, RN

## 2019-09-01 DIAGNOSIS — K529 Noninfective gastroenteritis and colitis, unspecified: Secondary | ICD-10-CM | POA: Insufficient documentation

## 2019-09-22 ENCOUNTER — Ambulatory Visit (INDEPENDENT_AMBULATORY_CARE_PROVIDER_SITE_OTHER): Payer: 59 | Admitting: Gastroenterology

## 2019-09-22 ENCOUNTER — Encounter: Payer: Self-pay | Admitting: Gastroenterology

## 2019-09-22 ENCOUNTER — Other Ambulatory Visit: Payer: Self-pay

## 2019-09-22 VITALS — BP 102/74 | HR 84 | Temp 97.4°F | Ht 63.0 in | Wt 170.4 lb

## 2019-09-22 DIAGNOSIS — K219 Gastro-esophageal reflux disease without esophagitis: Secondary | ICD-10-CM | POA: Diagnosis not present

## 2019-09-22 DIAGNOSIS — E860 Dehydration: Secondary | ICD-10-CM

## 2019-09-22 DIAGNOSIS — K8689 Other specified diseases of pancreas: Secondary | ICD-10-CM | POA: Diagnosis not present

## 2019-09-22 DIAGNOSIS — Z932 Ileostomy status: Secondary | ICD-10-CM | POA: Diagnosis not present

## 2019-09-22 MED ORDER — GLYCOPYRROLATE 1 MG PO TABS: 1.0000 mg | ORAL_TABLET | Freq: Two times a day (BID) | ORAL | 11 refills | Status: DC

## 2019-09-22 NOTE — Addendum Note (Signed)
Addended by: Lucio Edward T on: 09/22/2019 01:36 PM   Modules accepted: Level of Service

## 2019-09-22 NOTE — Progress Notes (Addendum)
    History of Present Illness: This is a 73 year old female who is status post an extended right hemicolectomy followed a few days later by a small bowel resection of 130 cm and partial omentectomy for ischemic bowel in January 2019.  She has an end ileostomy. Per surgical notes she has 220 cm of small bowel remaining.  Ileostomy takedown and reestablishing bowel continuity was being considered. Per Dr. Orest Dikes September 21 office note he advised against having an ileostomy takedown and reanastomosis performed here due to significant risk of morbidity, mortality due to her medical problems and intestinal vascular insufficiency. He advised evaluation a tertiary care center should she wish to further pursue surgical options.  She was treated for dehydration in the ED on 06/20/2019 and hospitalized on 08/25/2019 for dehydration, acute kidney injury on stage III CKD.  She notes occasional periumbilical abdominal cramping and complains of persistent high ostomy output. Typically empties her ostomy bag 5-6 times per day.   Flex sigmoidoscopy 12/2018 Diffuse mild mucosal changes were found in the rectum and in the sigmoid colon secondary to diversion colitis. Biopsied. - Unable to advance beyond 25 cm due to restricted mobility of the colon. - Internal hemorrhoids.  Current Medications, Allergies, Past Medical History, Past Surgical History, Family History and Social History were reviewed in [employer] record.   Physical Exam: General: Well developed, well nourished, no acute distress Head: Normocephalic and atraumatic Eyes:  sclerae anicteric, EOMI Ears: Normal auditory acuity Mouth: No deformity or lesions Lungs: Clear throughout to auscultation Heart: Regular rate and rhythm; no murmurs, rubs or bruits Abdomen: Soft, non tender and non distended. No masses, hepatosplenomegaly or hernias noted. Normal Bowel sounds. Ileostomy Musculoskeletal: Symmetrical with no gross deformities   Pulses:  Normal pulses noted Extremities: No clubbing, cyanosis, edema or deformities noted Neurological: Alert oriented x 4, grossly nonfocal Psychological:  Alert and cooperative. Normal mood and affect   Assessment and Recommendations:  1. Ileostomy, frequently with high output, frequent dehydration, AKI. ED visit in September and admission in November for dehydration, AKI. History of ischemic bowel requiring an extended right hemicolectomy and small bowel resection with 220 cm of small bowel remaining.  Modify daily fluid intake fluid intake by discontinuing all sodas. Minimize caffeinated beverages to no more than 2 per day.  About half her daily fluid intake should be Gatorade (or similar sugar and electrolyte drink), Pedialyte (or similar product) or coconut water.  The rest of her fluid intake should be water. Continue Lomotil tid (not prn) and Imodium tid prn diarrhea. Begin glycopyrrolate 1 mg twice daily (not prn).  Discussed option of a second surgical opinion at a tertiary center however she is reluctant to consider surgery as she has been about advised of the challenges and risks. REV in 6 weeks.   2. Pancreatic insufficiency.  Continue fat modified diet.  Continue Zenpep ac.  3. Personal history of adenomatous colon polyps. History of 2 cecal polyps removed piecemeal in 2018 and had S/P extended right hemicolectomy in 2019 as above. Limited flex sigmoidoscopy in 12/2018 without polyps noted. Given co-mobidities and age no current plans for future surveillance colonoscopy, sigmoidoscopy.   4. GERD. Continue Nexium 40 mg po qd. Follow antireflux measures and gastroparesis diet.   5. Fatty liver, abnormal hepatic contour on CT. Possible early cirrhosis. Trend blood work.  6. DM  7. CAD  8. PVD.  9. History of stoke.

## 2019-09-22 NOTE — Patient Instructions (Addendum)
We have sent the following medications to your pharmacy for you to pick up at your convenience: glycopyrrolate.   Increase your fluid intake.   You can drink coconut water, Gatorade and Pedialyte.  Please follow up with Dr. Fuller Plan in 2 months.   Thank you for choosing me and [facility].  Pricilla Riffle. Dagoberto Ligas., MD., [facility]

## 2019-10-01 ENCOUNTER — Other Ambulatory Visit: Payer: Self-pay | Admitting: Neurology

## 2019-10-15 ENCOUNTER — Encounter (HOSPITAL_COMMUNITY): Payer: Self-pay | Admitting: *Deleted

## 2019-10-15 ENCOUNTER — Emergency Department (HOSPITAL_COMMUNITY): Payer: 59

## 2019-10-15 ENCOUNTER — Inpatient Hospital Stay (HOSPITAL_COMMUNITY)
Admission: EM | Admit: 2019-10-15 | Discharge: 2019-10-20 | DRG: 177 | Disposition: A | Discharge status: Home Health | Payer: 59 | Attending: Internal Medicine | Admitting: Internal Medicine

## 2019-10-15 DIAGNOSIS — U071 COVID-19: Principal | ICD-10-CM | POA: Diagnosis present

## 2019-10-15 DIAGNOSIS — R0600 Dyspnea, unspecified: Secondary | ICD-10-CM

## 2019-10-15 DIAGNOSIS — Z91041 Radiographic dye allergy status: Secondary | ICD-10-CM

## 2019-10-15 DIAGNOSIS — E1151 Type 2 diabetes mellitus with diabetic peripheral angiopathy without gangrene: Secondary | ICD-10-CM | POA: Diagnosis present

## 2019-10-15 DIAGNOSIS — J1282 Pneumonia due to coronavirus disease 2019: Secondary | ICD-10-CM | POA: Diagnosis present

## 2019-10-15 DIAGNOSIS — E875 Hyperkalemia: Secondary | ICD-10-CM | POA: Diagnosis present

## 2019-10-15 DIAGNOSIS — K449 Diaphragmatic hernia without obstruction or gangrene: Secondary | ICD-10-CM | POA: Diagnosis present

## 2019-10-15 DIAGNOSIS — I5042 Chronic combined systolic (congestive) and diastolic (congestive) heart failure: Secondary | ICD-10-CM | POA: Diagnosis present

## 2019-10-15 DIAGNOSIS — I248 Other forms of acute ischemic heart disease: Secondary | ICD-10-CM | POA: Diagnosis present

## 2019-10-15 DIAGNOSIS — Z885 Allergy status to narcotic agent status: Secondary | ICD-10-CM

## 2019-10-15 DIAGNOSIS — N184 Chronic kidney disease, stage 4 (severe): Secondary | ICD-10-CM | POA: Diagnosis present

## 2019-10-15 DIAGNOSIS — N179 Acute kidney failure, unspecified: Secondary | ICD-10-CM | POA: Diagnosis present

## 2019-10-15 DIAGNOSIS — K760 Fatty (change of) liver, not elsewhere classified: Secondary | ICD-10-CM | POA: Diagnosis present

## 2019-10-15 DIAGNOSIS — R160 Hepatomegaly, not elsewhere classified: Secondary | ICD-10-CM | POA: Diagnosis present

## 2019-10-15 DIAGNOSIS — N390 Urinary tract infection, site not specified: Secondary | ICD-10-CM | POA: Diagnosis present

## 2019-10-15 DIAGNOSIS — E871 Hypo-osmolality and hyponatremia: Secondary | ICD-10-CM | POA: Diagnosis present

## 2019-10-15 DIAGNOSIS — F329 Major depressive disorder, single episode, unspecified: Secondary | ICD-10-CM | POA: Diagnosis present

## 2019-10-15 DIAGNOSIS — K219 Gastro-esophageal reflux disease without esophagitis: Secondary | ICD-10-CM | POA: Diagnosis present

## 2019-10-15 DIAGNOSIS — G40909 Epilepsy, unspecified, not intractable, without status epilepticus: Secondary | ICD-10-CM | POA: Diagnosis present

## 2019-10-15 DIAGNOSIS — K861 Other chronic pancreatitis: Secondary | ICD-10-CM | POA: Diagnosis present

## 2019-10-15 DIAGNOSIS — I130 Hypertensive heart and chronic kidney disease with heart failure and stage 1 through stage 4 chronic kidney disease, or unspecified chronic kidney disease: Secondary | ICD-10-CM | POA: Diagnosis present

## 2019-10-15 DIAGNOSIS — Z8249 Family history of ischemic heart disease and other diseases of the circulatory system: Secondary | ICD-10-CM

## 2019-10-15 DIAGNOSIS — F5103 Paradoxical insomnia: Secondary | ICD-10-CM | POA: Diagnosis present

## 2019-10-15 DIAGNOSIS — B961 Klebsiella pneumoniae [K. pneumoniae] as the cause of diseases classified elsewhere: Secondary | ICD-10-CM | POA: Diagnosis present

## 2019-10-15 DIAGNOSIS — I2510 Atherosclerotic heart disease of native coronary artery without angina pectoris: Secondary | ICD-10-CM | POA: Diagnosis present

## 2019-10-15 DIAGNOSIS — Z86718 Personal history of other venous thrombosis and embolism: Secondary | ICD-10-CM

## 2019-10-15 DIAGNOSIS — Z933 Colostomy status: Secondary | ICD-10-CM

## 2019-10-15 DIAGNOSIS — E785 Hyperlipidemia, unspecified: Secondary | ICD-10-CM | POA: Diagnosis present

## 2019-10-15 DIAGNOSIS — E872 Acidosis: Secondary | ICD-10-CM | POA: Diagnosis present

## 2019-10-15 DIAGNOSIS — Z7982 Long term (current) use of aspirin: Secondary | ICD-10-CM

## 2019-10-15 DIAGNOSIS — Z87891 Personal history of nicotine dependence: Secondary | ICD-10-CM

## 2019-10-15 DIAGNOSIS — G9341 Metabolic encephalopathy: Secondary | ICD-10-CM | POA: Diagnosis present

## 2019-10-15 DIAGNOSIS — Z7902 Long term (current) use of antithrombotics/antiplatelets: Secondary | ICD-10-CM

## 2019-10-15 DIAGNOSIS — J159 Unspecified bacterial pneumonia: Secondary | ICD-10-CM | POA: Diagnosis present

## 2019-10-15 DIAGNOSIS — R680 Hypothermia, not associated with low environmental temperature: Secondary | ICD-10-CM | POA: Diagnosis present

## 2019-10-15 DIAGNOSIS — E1122 Type 2 diabetes mellitus with diabetic chronic kidney disease: Secondary | ICD-10-CM | POA: Diagnosis present

## 2019-10-15 DIAGNOSIS — Z90710 Acquired absence of both cervix and uterus: Secondary | ICD-10-CM

## 2019-10-15 DIAGNOSIS — J9601 Acute respiratory failure with hypoxia: Secondary | ICD-10-CM | POA: Diagnosis present

## 2019-10-15 DIAGNOSIS — E860 Dehydration: Secondary | ICD-10-CM | POA: Diagnosis present

## 2019-10-15 DIAGNOSIS — Z20822 Contact with and (suspected) exposure to covid-19: Secondary | ICD-10-CM

## 2019-10-15 DIAGNOSIS — Z9049 Acquired absence of other specified parts of digestive tract: Secondary | ICD-10-CM

## 2019-10-15 DIAGNOSIS — Z91048 Other nonmedicinal substance allergy status: Secondary | ICD-10-CM

## 2019-10-15 DIAGNOSIS — G4730 Sleep apnea, unspecified: Secondary | ICD-10-CM | POA: Diagnosis present

## 2019-10-15 DIAGNOSIS — F419 Anxiety disorder, unspecified: Secondary | ICD-10-CM | POA: Diagnosis present

## 2019-10-15 DIAGNOSIS — K589 Irritable bowel syndrome without diarrhea: Secondary | ICD-10-CM | POA: Diagnosis present

## 2019-10-15 DIAGNOSIS — Z95828 Presence of other vascular implants and grafts: Secondary | ICD-10-CM

## 2019-10-15 DIAGNOSIS — Z823 Family history of stroke: Secondary | ICD-10-CM

## 2019-10-15 DIAGNOSIS — J189 Pneumonia, unspecified organism: Secondary | ICD-10-CM | POA: Diagnosis present

## 2019-10-15 DIAGNOSIS — Z8673 Personal history of transient ischemic attack (TIA), and cerebral infarction without residual deficits: Secondary | ICD-10-CM

## 2019-10-15 DIAGNOSIS — Z79899 Other long term (current) drug therapy: Secondary | ICD-10-CM

## 2019-10-15 DIAGNOSIS — E538 Deficiency of other specified B group vitamins: Secondary | ICD-10-CM | POA: Diagnosis present

## 2019-10-15 DIAGNOSIS — I459 Conduction disorder, unspecified: Secondary | ICD-10-CM | POA: Diagnosis present

## 2019-10-15 DIAGNOSIS — N189 Chronic kidney disease, unspecified: Secondary | ICD-10-CM | POA: Diagnosis present

## 2019-10-15 LAB — COMPREHENSIVE METABOLIC PANEL
ALT: 27 U/L (ref 0–44)
AST: 38 U/L (ref 15–41)
Albumin: 3.2 g/dL — ABNORMAL LOW (ref 3.5–5.0)
Alkaline Phosphatase: 119 U/L (ref 38–126)
Anion gap: 22 — ABNORMAL HIGH (ref 5–15)
BUN: 92 mg/dL — ABNORMAL HIGH (ref 8–23)
CO2: 10 mmol/L — ABNORMAL LOW (ref 22–32)
Calcium: 9.2 mg/dL (ref 8.9–10.3)
Chloride: 85 mmol/L — ABNORMAL LOW (ref 98–111)
Creatinine, Ser: 6.18 mg/dL — ABNORMAL HIGH (ref 0.44–1.00)
GFR calc Af Amer: 7 mL/min — ABNORMAL LOW (ref >60)
GFR calc non Af Amer: 6 mL/min — ABNORMAL LOW (ref >60)
Glucose, Bld: 98 mg/dL (ref 70–99)
Potassium: 6.1 mmol/L — ABNORMAL HIGH (ref 3.5–5.1)
Sodium: 117 mmol/L — CL (ref 135–145)
Total Bilirubin: 0.6 mg/dL (ref 0.3–1.2)
Total Protein: 7.5 g/dL (ref 6.5–8.1)

## 2019-10-15 LAB — I-STAT CHEM 8, ED
BUN: 106 mg/dL — ABNORMAL HIGH (ref 8–23)
Calcium, Ion: 1.1 mmol/L — ABNORMAL LOW (ref 1.15–1.40)
Chloride: 90 mmol/L — ABNORMAL LOW (ref 98–111)
Creatinine, Ser: 6.8 mg/dL — ABNORMAL HIGH (ref 0.44–1.00)
Glucose, Bld: 97 mg/dL (ref 70–99)
HCT: 37 % (ref 36.0–46.0)
Hemoglobin: 12.6 g/dL (ref 12.0–15.0)
Potassium: 6.2 mmol/L — ABNORMAL HIGH (ref 3.5–5.1)
Sodium: 114 mmol/L — CL (ref 135–145)
TCO2: 12 mmol/L — ABNORMAL LOW (ref 22–32)

## 2019-10-15 LAB — POC SARS CORONAVIRUS 2 AG -  ED: SARS Coronavirus 2 Ag: NEGATIVE

## 2019-10-15 LAB — CBC WITH DIFFERENTIAL/PLATELET
Abs Immature Granulocytes: 0.16 10*3/uL — ABNORMAL HIGH (ref 0.00–0.07)
Basophils Absolute: 0 10*3/uL (ref 0.0–0.1)
Basophils Relative: 0 %
Eosinophils Absolute: 0 10*3/uL (ref 0.0–0.5)
Eosinophils Relative: 0 %
HCT: 35.1 % — ABNORMAL LOW (ref 36.0–46.0)
Hemoglobin: 11.9 g/dL — ABNORMAL LOW (ref 12.0–15.0)
Immature Granulocytes: 1 %
Lymphocytes Relative: 7 %
Lymphs Abs: 1.2 10*3/uL (ref 0.7–4.0)
MCH: 28.5 pg (ref 26.0–34.0)
MCHC: 33.9 g/dL (ref 30.0–36.0)
MCV: 84.2 fL (ref 80.0–100.0)
Monocytes Absolute: 0.9 10*3/uL (ref 0.1–1.0)
Monocytes Relative: 6 %
Neutro Abs: 14 10*3/uL — ABNORMAL HIGH (ref 1.7–7.7)
Neutrophils Relative %: 86 %
Platelets: 284 10*3/uL (ref 150–400)
RBC: 4.17 MIL/uL (ref 3.87–5.11)
RDW: 12.8 % (ref 11.5–15.5)
WBC: 16.2 10*3/uL — ABNORMAL HIGH (ref 4.0–10.5)
nRBC: 0.1 % (ref 0.0–0.2)

## 2019-10-15 LAB — LACTATE DEHYDROGENASE: LDH: 162 U/L (ref 98–192)

## 2019-10-15 LAB — D-DIMER, QUANTITATIVE: D-Dimer, Quant: 3.01 ug/mL-FEU — ABNORMAL HIGH (ref 0.00–0.50)

## 2019-10-15 LAB — C-REACTIVE PROTEIN: CRP: 9.9 mg/dL — ABNORMAL HIGH (ref <1.0)

## 2019-10-15 LAB — LACTIC ACID, PLASMA: Lactic Acid, Venous: 1.9 mmol/L (ref 0.5–1.9)

## 2019-10-15 LAB — PROCALCITONIN: Procalcitonin: 0.46 ng/mL

## 2019-10-15 LAB — FERRITIN: Ferritin: 123 ng/mL (ref 11–307)

## 2019-10-15 LAB — TROPONIN I (HIGH SENSITIVITY): Troponin I (High Sensitivity): 23 ng/L — ABNORMAL HIGH (ref <18)

## 2019-10-15 LAB — FIBRINOGEN: Fibrinogen: 785 mg/dL — ABNORMAL HIGH (ref 210–475)

## 2019-10-15 LAB — ETHANOL: Alcohol, Ethyl (B): <10 mg/dL (ref <10)

## 2019-10-15 LAB — TRIGLYCERIDES: Triglycerides: 215 mg/dL — ABNORMAL HIGH (ref <150)

## 2019-10-15 MED ORDER — SODIUM ZIRCONIUM CYCLOSILICATE 10 G PO PACK
10.0000 g | PACK | Freq: Once | ORAL | Status: AC
  Administered 2019-10-15: 10 g via ORAL
  Filled 2019-10-15: qty 1

## 2019-10-15 MED ORDER — SODIUM BICARBONATE 8.4 % IV SOLN
INTRAVENOUS | Status: AC
  Filled 2019-10-15: qty 50

## 2019-10-15 MED ORDER — SODIUM BICARBONATE-DEXTROSE 150-5 MEQ/L-% IV SOLN
150.0000 meq | INTRAVENOUS | Status: DC
  Administered 2019-10-16: 150 meq via INTRAVENOUS
  Filled 2019-10-15 (×2): qty 1000

## 2019-10-15 MED ORDER — SODIUM CHLORIDE 0.9 % IV SOLN
500.0000 mg | Freq: Every day | INTRAVENOUS | Status: DC
  Administered 2019-10-16: 500 mg via INTRAVENOUS
  Filled 2019-10-15: qty 500

## 2019-10-15 MED ORDER — SODIUM BICARBONATE 8.4 % IV SOLN
50.0000 meq | Freq: Once | INTRAVENOUS | Status: AC
  Administered 2019-10-15: 50 meq via INTRAVENOUS

## 2019-10-15 MED ORDER — SODIUM CHLORIDE 0.9 % IV SOLN
1.0000 g | Freq: Every day | INTRAVENOUS | Status: DC
  Administered 2019-10-16: 1 g via INTRAVENOUS
  Filled 2019-10-15: qty 10

## 2019-10-15 MED ORDER — CALCIUM GLUCONATE-NACL 1-0.675 GM/50ML-% IV SOLN
1.0000 g | Freq: Once | INTRAVENOUS | Status: AC
  Administered 2019-10-15: 1000 mg via INTRAVENOUS
  Filled 2019-10-15: qty 50

## 2019-10-15 MED ORDER — SODIUM CHLORIDE 0.9 % IV SOLN: 1.0000 g | Freq: Once | INTRAVENOUS | Status: DC

## 2019-10-15 MED ORDER — SODIUM CHLORIDE 0.9 % IV BOLUS
1000.0000 mL | Freq: Once | INTRAVENOUS | Status: AC
  Administered 2019-10-15: 1000 mL via INTRAVENOUS

## 2019-10-15 MED ORDER — HEPARIN SODIUM (PORCINE) 5000 UNIT/ML IJ SOLN
5000.0000 [IU] | Freq: Three times a day (TID) | INTRAMUSCULAR | Status: DC
  Administered 2019-10-16 – 2019-10-20 (×14): 5000 [IU] via SUBCUTANEOUS
  Filled 2019-10-15 (×15): qty 1

## 2019-10-15 NOTE — ED Triage Notes (Signed)
Per EMS, pt's daughter states the patient seemed altered today when visiting her. She was last seen normal 12/24. Pt stays alone, is usually able to take care of herself. 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 Pt was alert and oriented to self and place.   HR 120 before fluid, 103 after 600cc BP 125/64 SpO2 92% on 2L, she doesn't require O2 at home CBG 119

## 2019-10-15 NOTE — ED Provider Notes (Signed)
[facility] DEPT Provider Note   CSN: [id number] Arrival date & time: 10/15/19  1814     History Chief Complaint  Patient presents with  . Altered Mental Status    [name] is a 74 y.o. female hx of CAD, DM, blind loop syndrome s/p multiple surgeries, here with AMS, confusion. Patient was with family in Christmas Eve. Her daughter and multiple other families tested positive for COVID.  Family lives close to the patient and was in contact with the patient until 2 or 3 days ago when they were tested positive.  Patient apparently has been at home and has been confused .  Family did call her frequently and most recently was yesterday and she appeared at her baseline.  She did not answer the phone today so family went to check on her and she is confused and altered. They were not sure if she took too much of her medications or not.  Family then went to check in on her and apparently she was confused and laying on the floor and not making any sense.  Patient also has a colostomy but it was full of stool. Patient was noted to be slightly hypothermic and hypoxic as well.  Patient is not on oxygen at baseline.  The history is provided by the patient and a relative.       Past Medical History:  Diagnosis Date  . Anxiety   . Arthropathy, unspecified, site unspecified   . Blind loop syndrome   . Carotid artery disease ([place])    documented occlusion of left ICA  . Coronary artery disease    a. known 60-70% mid-LAD stenosis with caths in 1999, 2002, 2006, and 2012 showing stable anatomy b. low-risk NST in 10/2015  . Depression   . Diabetes insipidus ([place])   . Diabetes mellitus   . Diverticulosis   . Dizziness    chronic  . Dyslipidemia   . Dyspnea    with exertion  . Fatty liver   . GERD (gastroesophageal reflux disease)    hiatial hernia  . Hepatomegaly   . Hiatal hernia   . HTN (hypertension)   . Hyperlipidemia   . Hypertension   . Irritable bowel  syndrome   . Melanosis coli   . Metatarsal fracture right  . Normal nuclear stress test    nml 01/03/10;06/19/07  . Other, mixed, or unspecified nondependent drug abuse, unspecified   . Pancreatitis, chronic ([place])   . Peripheral vascular disease ([place]) 02/20/10   s/p stent of left SFA  . Seizure ([place]) 11/04/2018  . Sleep apnea   . Stroke ([place])   . Thyroid nodule   . Unspecified essential hypertension   . Vitamin B12 deficiency     Patient Active Problem List   Diagnosis Date Noted  . Seizures ([place]) 01/07/2019  . Cerebrovascular accident (CVA) due to occlusion of left carotid artery ([place]) 01/07/2019  . CAD, multiple vessel 01/07/2019  . H/O ischemic bowel disease 01/07/2019  . Acute bronchitis 12/04/2018  . Oral phase dysphagia 12/04/2018  . CKD (chronic kidney disease) stage 3, GFR 30-59 ml/min 11/04/2018  . Seizure ([place]) 11/03/2018  . 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 Scar of forehead 01/06/2018  . Traumatic ulceration of tongue 12/04/2017  . Ileostomy in place [facility]) 10/23/2017  . Pressure injury of skin 10/19/2017  . 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  Ileostomy 10/18/2017 10/14/2017  . Ischemic colitis ([place]) 10/14/2017  . Acute respiratory failure  with hypoxemia ([place]) 10/14/2017  . Acute post-operative pain 10/14/2017  . AKI (acute kidney injury) ([place]) 10/14/2017  . Anemia 10/14/2017  . Nocturia more than twice per night 05/07/2017  . Diabetes due to underlying condition w oth circulatory comp ([place]) 05/07/2017  . Atherosclerotic PVD with intermittent claudication ([place]) 05/07/2017  . Poor sleep hygiene 05/07/2017  . 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 Hilar lymphadenopathy   . Hemoptysis 02/19/2017  . Pulmonary nodules/lesions, multiple 02/19/2017  . Tobacco use 02/19/2017  . RUQ abdominal pain 01/13/2017  . Left-sided extracranial carotid artery occlusion 11/26/2016  . PAOD (peripheral arterial occlusive disease) ([place]) 11/26/2016  . Encounter for  routine follow-up after surgery of the circulatory system 11/26/2016  . Chest pain with moderate risk of acute coronary syndrome 10/27/2015  . Acid indigestion 12/31/2013  . Pain in left hip 08/26/2013  . Screening for gout 05/13/2013  . Disorder of skin or subcutaneous tissue 12/22/2012  . Difficult or painful urination 09/02/2012  . Peripheral blood vessel disorder ([place]) 05/11/2012  . 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 Follow-up examination, following unspecified surgery 05/11/2012  . Occlusion and stenosis of carotid artery without mention of cerebral infarction 04/14/2012  . Fatigue 09/24/2011  . Gonalgia 07/04/2011  . Avitaminosis D 05/02/2011  . Chronic pancreatitis ([place]) 04/30/2011  . Vitamin B12 deficiency 04/30/2011  . 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 Fatty liver 04/30/2011  . Adiposity 04/30/2011  . Other and unspecified general psychiatric examination 04/26/2011  . Abdominal pain 04/09/2011  . Iron deficiency anemia, unspecified  04/09/2011  . Other general symptoms  04/09/2011  . DM (diabetes mellitus screen) 04/09/2011  . Asthma 04/09/2011  . Type 2 diabetes mellitus with diabetic peripheral angiopathy without gangrene ([place]) 01/24/2011  . Abnormal weight gain 11/30/2010  . Dyslipidemia   . Dizziness   . Right-sided extracranial carotid artery stenosis   . Normal nuclear stress test   . Feeling bilious 07/11/2010  . Underimmunization status 07/11/2010  . Pancreas (digestive gland) works poorly 05/30/2010  . B12 deficiency 03/19/2010  . DVT 03/16/2010  . BLIND LOOP SYNDROME 03/16/2010  . Diarrhea 03/16/2010  . Chronic depression 02/23/2010  . Cough 11/17/2009  . Plantar verruca 11/17/2009  . Arteriosclerosis of coronary artery 11/16/2009  . Claudication ([place]) 11/16/2009  . Compulsive tobacco user syndrome 11/16/2009  . HLD (hyperlipidemia) 11/16/2009  . Nontoxic single thyroid nodule 11/16/2009  . Obstructive apnea 11/16/2009   . Radial styloid tenosynovitis 11/16/2009  . LBP (low back pain) 11/16/2009  . LAXATIVE ABUSE 12/17/2007  . BP (high blood pressure) 12/17/2007  . Acid reflux 12/17/2007  . HIATAL HERNIA 12/17/2007  . IBS 12/17/2007  . MELANOSIS COLI 12/17/2007  . ARTHRITIS 12/17/2007  . HEPATOMEGALY 12/17/2007    Past Surgical History:  Procedure Laterality Date  . ABDOMINAL HYSTERECTOMY     partial  . AUGMENTATION MAMMAPLASTY     bilateral 1976 retro   . BREAST ENHANCEMENT SURGERY    . CARDIAC CATHETERIZATION  07/07/98;08/31/01;03/04/05   60 -70% LAD  . CATARACT EXTRACTION    . COLON RESECTION N/A 10/14/2017   Procedure: EXPLORATORY LAPAROTOMY, EXTENDED RIGHT COLECTOMY; APPLICATION OF ABDOMINAL VACUUM DRESSING;  Surgeon: Jovita Kussmaul, MD;  Location: [facility] ORS;  Service: General;  Laterality: N/A;  . ENDOBRONCHIAL ULTRASOUND Bilateral 02/24/2017   Procedure: ENDOBRONCHIAL ULTRASOUND;  Surgeon: Collene Gobble, MD;  Location: [facility] ENDOSCOPY;  Service: Cardiopulmonary;  Laterality: Bilateral;  . EYE SURGERY Left Nov. 2016   Cataract  . EYE SURGERY Right  2001   Cataract  . FEMORAL ARTERY STENT     Left leg  . LAPAROTOMY N/A 10/16/2017   Procedure: EXPLORATORY LAPAROTOMY, PARTIAL OMENTECTOMY, RESECTION ISCHEMIC ILEUM 062IR, APPLICATION OF VAC ABDOMINAL DRESSING;  Surgeon: Armandina Gemma, MD;  Location: [facility] ORS;  Service: General;  Laterality: N/A;  . LAPAROTOMY N/A 10/18/2017   Procedure: RE-EXPLORATION OF ABDOMEN, ILEOSTOMY CREATION;  Surgeon: Stark Klein, MD;  Location: [facility];  Service: General;  Laterality: N/A;  . LUNG SURGERY  03/2017   Benign polyps removed  . PATELLA REALIGNMENT Left   . TONSILLECTOMY    . TOTAL ABDOMINAL HYSTERECTOMY    . WRIST SURGERY     right     OB History   No obstetric history on file.     Family History  Problem Relation Age of Onset  . Hypertension Mother   . Osteoarthritis Mother   . Pulmonary embolism Mother   . Hypertension Father        aneurysm  .  Stroke Father   . Stroke Other   . Colon cancer Paternal Grandfather   . Breast cancer Neg Hx     Social History   Tobacco Use  . Smoking status: Former Smoker    Packs/day: 1.00    Years: 50.00    Pack years: 50.00    Types: Cigarettes    Quit date: 10/14/2010    Years since quitting: 9.0  . Smokeless tobacco: Never Used  Substance Use Topics  . Alcohol use: No  . Drug use: No    Home Medications Prior to Admission medications   Medication Sig Start Date End Date Taking? Authorizing Provider  acetaminophen (TYLENOL) 325 MG tablet Take 650 mg by mouth every 6 (six) hours as needed for headache (pain).    [provider]  amLODipine (NORVASC) 10 MG tablet Place 1 tablet (10 mg total) into feeding tube daily. Patient taking differently: Take 10 mg by mouth daily.  11/06/17   Florencia Reasons, MD  ARIPiprazole (ABILIFY) 5 MG tablet Place 1 tablet (5 mg total) into feeding tube daily. Patient taking differently: Take 5 mg by mouth daily.  11/06/17   Florencia Reasons, MD  aspirin EC 81 MG tablet Take 81 mg by mouth daily.    [provider]  atorvastatin (LIPITOR) 20 MG tablet Take 20 mg by mouth daily.    [provider]  clopidogrel (PLAVIX) 75 MG tablet Take 75 mg by mouth daily.    [provider]  cyclobenzaprine (FLEXERIL) 10 MG tablet Take 10 mg by mouth 3 (three) times daily as needed. 09/14/19   [provider]  diphenoxylate-atropine (LOMOTIL) 2.5-0.025 MG tablet Take 1 tablet by mouth 3 (three) times daily. 08/27/19   Domenic Polite, MD  esomeprazole (NEXIUM) 40 MG capsule Take 40 mg by mouth daily. 07/29/18   [provider]  glycopyrrolate (ROBINUL) 1 MG tablet Take 1 tablet (1 mg total) by mouth 2 (two) times daily. 09/22/19   Ladene Artist, MD  hydrOXYzine (ATARAX/VISTARIL) 25 MG tablet Take 25 mg by mouth at bedtime as needed (sleep).    [provider]  levETIRAcetam (KEPPRA) 500 MG tablet TAKE 1 TABLET BY MOUTH TWICE A  DAY 10/04/19   Dohmeier, Asencion Partridge, MD  loperamide (IMODIUM) 2 MG capsule Take 2 mg by mouth daily as needed for diarrhea or loose stools.     [provider]  metoprolol succinate (TOPROL-XL) 100 MG 24 hr tablet Take 100 mg by mouth daily. Take  with or immediately following a meal.    [provider]  mirtazapine (REMERON) 15 MG tablet Take 15 mg by mouth at bedtime.    [provider]  montelukast (SINGULAIR) 10 MG tablet Take 10 mg by mouth daily. 08/09/18   [provider]  nitroGLYCERIN (NITROSTAT) 0.4 MG SL tablet Place 1 tablet (0.4 mg total) under the tongue every 5 (five) minutes as needed for chest pain. 11/08/15   [place], Peter M, MD  Pancrelipase, Lip-Prot-Amyl, (ZENPEP) [phone] units CPEP Take 1 capsule by mouth 3 (three) times daily with meals.    [provider]  pantoprazole (PROTONIX) 40 MG tablet Take 40 mg by mouth daily.    [provider]  venlafaxine XR (EFFEXOR-XR) 150 MG 24 hr capsule Take 150 mg by mouth daily.    [provider]  vitamin B-12 (CYANOCOBALAMIN) 500 MCG tablet Take 500 mcg by mouth daily.    [provider]  Vitamin D, Ergocalciferol, (DRISDOL) 1.25 MG (50000 UT) CAPS capsule Take 50,000 Units by mouth every Wednesday.     [provider]    Allergies    Adhesive [tape], Codeine, and Iodinated diagnostic agents  Review of Systems   Review of Systems  Neurological: Positive for weakness.  All other systems reviewed and are negative.   Physical Exam Updated Vital Signs BP (!) 151/84   Pulse (!) 118   Temp (!) 97.1 F (36.2 C) (Rectal)   Resp 13   SpO2 100%   Physical Exam Vitals and nursing note reviewed.  Constitutional:      Comments: Altered, confused   HENT:     Head: Normocephalic.     Nose: Nose normal.     Mouth/Throat:     Mouth: Mucous membranes are dry.  Eyes:     Extraocular Movements: Extraocular movements intact.     Pupils: Pupils are equal,  round, and reactive to light.  Cardiovascular:     Rate and Rhythm: Normal rate and regular rhythm.     Pulses: Normal pulses.     Heart sounds: Normal heart sounds.  Pulmonary:     Effort: Pulmonary effort is normal.     Breath sounds: Normal breath sounds.  Abdominal:     General: Abdomen is flat.     Comments: Colostomy in place. There is stool around the colostomy as well. Mild diffuse tenderness   Musculoskeletal:        General: Normal range of motion.     Cervical back: Normal range of motion.  Skin:    General: Skin is warm.     Capillary Refill: Capillary refill takes less than 2 seconds.  Neurological:     Comments: A & O x 2. Confused. Moving all extremities   Psychiatric:     Comments: Unable      ED Results / Procedures / Treatments   Labs (all labs ordered are listed, but only abnormal results are displayed) Labs Reviewed  CBC WITH DIFFERENTIAL/PLATELET - Abnormal; Notable for the following components:      Result Value   WBC 16.2 (*)    Hemoglobin 11.9 (*)    HCT 35.1 (*)    Neutro Abs 14.0 (*)    Abs Immature Granulocytes 0.16 (*)    All other components within normal limits  TRIGLYCERIDES - Abnormal; Notable for the following components:   Triglycerides 215 (*)    All other components within normal limits  I-STAT CHEM 8, ED - Abnormal; Notable for the  following components:   Sodium 114 (*)    Potassium 6.2 (*)    Chloride 90 (*)    BUN 106 (*)    Creatinine, Ser 6.80 (*)    Calcium, Ion 1.10 (*)    TCO2 12 (*)    All other components within normal limits  TROPONIN I (HIGH SENSITIVITY) - Abnormal; Notable for the following components:   Troponin I (High Sensitivity) 23 (*)    All other components within normal limits  URINE CULTURE  CULTURE, BLOOD (ROUTINE X 2)  CULTURE, BLOOD (ROUTINE X 2)  SARS CORONAVIRUS 2 (TAT 6-24 HRS)  LACTIC ACID, PLASMA  ETHANOL  LACTATE DEHYDROGENASE  COMPREHENSIVE METABOLIC PANEL  URINALYSIS, ROUTINE W REFLEX  MICROSCOPIC  LACTIC ACID, PLASMA  RAPID URINE DRUG SCREEN, HOSP PERFORMED  D-DIMER, QUANTITATIVE (NOT AT [facility])  PROCALCITONIN  FERRITIN  FIBRINOGEN  C-REACTIVE PROTEIN  RENAL FUNCTION PANEL  RENAL FUNCTION PANEL  POC SARS CORONAVIRUS 2 AG -  ED  TROPONIN I (HIGH SENSITIVITY)    EKG EKG Interpretation  Date/Time:  Friday October 15 2019 18:46:14 EST Ventricular Rate:  98 PR Interval:    QRS Duration: 112 QT Interval:  324 QTC Calculation: 414 R Axis:   40 Text Interpretation: Sinus rhythm Borderline intraventricular conduction delay Borderline low voltage, extremity leads Borderline repolarization abnormality No significant change since last tracing Confirmed by Wandra Arthurs [phone]) on 10/15/2019 7:46:33 PM   Radiology CT Head Wo Contrast  Result Date: 10/15/2019 CLINICAL DATA:  Weakness, altered mental status EXAM: CT HEAD WITHOUT CONTRAST TECHNIQUE: Contiguous axial images were obtained from the base of the skull through the vertex without intravenous contrast. COMPARISON:  CT brain 06/20/2019 FINDINGS: Brain: No acute territorial infarction, hemorrhage or intracranial mass. Mild atrophy. Chronic infarct within the left parietal white matter. Mild small vessel ischemic changes of the white matter. Stable ventricle size Vascular: No hyperdense vessels. Vertebral and carotid vascular calcification Skull: Normal. Negative for fracture or focal lesion. Sinuses/Orbits: No acute finding. Other: None IMPRESSION: 1. No CT evidence for acute intracranial abnormality. 2. Atrophy and minimal small vessel ischemic change of the white matter. Chronic infarct within the left parietal white matter. Electronically Signed   By: Donavan Foil M.D.   On: 10/15/2019 20:32   DG Chest Port 1 View  Result Date: 10/15/2019 CLINICAL DATA:  Altered mental status. Ex-smoker, hypertension EXAM: PORTABLE CHEST 1 VIEW COMPARISON:  Chest radiograph 08/24/2019 FINDINGS: Stable cardiomediastinal contours with normal  heart size. The lungs are clear. No pneumothorax or large pleural effusion. No acute finding in the visualized skeleton. IMPRESSION: No acute cardiopulmonary finding. Electronically Signed   By: Audie Pinto M.D.   On: 10/15/2019 20:02   CT Renal Stone Study  Result Date: 10/15/2019 CLINICAL DATA:  Flank pain history of colostomy EXAM: CT ABDOMEN AND PELVIS WITHOUT CONTRAST TECHNIQUE: Multidetector CT imaging of the abdomen and pelvis was performed following the standard protocol without IV contrast. COMPARISON:  CT 06/20/2019, 11/21/2017 FINDINGS: Lower chest: Lung bases demonstrate scattered foci of ill-defined ground-glass density in the right middle lobe and bilateral lung bases. No pleural effusion. Heart size within normal limits. Coronary vascular calcification. Bilateral breast prostheses. Possible small amount of fluid around the left breast implant. Hepatobiliary: No focal liver abnormality is seen. No gallstones, gallbladder wall thickening, or biliary dilatation. Pancreas: Atrophic.  No inflammatory change or ductal dilatation Spleen: Normal in size without focal abnormality. Adrenals/Urinary Tract: Adrenal glands are within normal limits. Kidneys show no hydronephrosis. Possible small  cystocele. Bladder otherwise normal Stomach/Bowel: Status post right hemicolectomy and right lower quadrant ileostomy. Negative for bowel wall thickening Vascular/Lymphatic: Extensive aortic atherosclerosis. No aneurysm. No suspicious adenopathy Reproductive: Status post hysterectomy. No adnexal masses. Other: Negative for free air or free fluid. Small upper abdominal ventral hernia containing fat. Moderate left paramedian ventral hernia containing fat. Slightly caudal to this is a moderate to large left paramedian ventral hernia containing fat and surgically blind ending transverse colon. Similar infiltration of the left abdominal subcutaneous fat. Small left abdominal fat containing density, series 3, image  number 43, probable small area of fat necrosis, no change. Musculoskeletal: Degenerative changes. No acute or suspicious abnormality. IMPRESSION: 1. Multiple small foci of ill-defined ground-glass density at the right middle lobe and bilateral lung bases suspicious for multifocal infection, to include atypical or viral pneumonia. 2. Previous right hemicolectomy and right lower quadrant ileostomy without evidence for obstruction or bowel wall thickening. 3. Multiple ventral hernias as described above. Largest most caudal hernia contains fat and a portion of blind ending colon. Electronically Signed   By: Donavan Foil M.D.   On: 10/15/2019 20:46    Procedures Procedures (including critical care time)  CRITICAL CARE Performed by: Wandra Arthurs   Total critical care time: 30 minutes  Critical care time was exclusive of separately billable procedures and treating other patients.  Critical care was necessary to treat or prevent imminent or life-threatening deterioration.  Critical care was time spent personally by me on the following activities: development of treatment plan with patient and/or surrogate as well as nursing, discussions with consultants, evaluation of patient's response to treatment, examination of patient, obtaining history from patient or surrogate, ordering and performing treatments and interventions, ordering and review of laboratory studies, ordering and review of radiographic studies, pulse oximetry and re-evaluation of patient's condition.    Medications Ordered in ED Medications  sodium zirconium cyclosilicate (LOKELMA) packet 10 g (has no administration in time range)  sodium bicarbonate injection 50 mEq (has no administration in time range)  calcium gluconate 1 g/ 50 mL sodium chloride IVPB (has no administration in time range)  sodium bicarbonate 150 mEq in dextrose 5% 1000 mL infusion (has no administration in time range)  sodium chloride 0.9 % bolus 1,000 mL (1,000 mLs  Intravenous New Bag/Given 10/15/19 2139)    ED Course  I have reviewed the triage vital signs and the nursing notes.  Pertinent labs & imaging results that were available during my care of the patient were reviewed by me and considered in my medical decision making (see chart for details).    MDM Rules/Calculators/A&P                      [name] is a 74 y.o. female here presenting with altered mental status.  She is also hypoxic as well. Multiple family members tested positive for Covid as well.  She is also confused and hypothermic and hypoxic.  Will get labs and CT head and CT abdomen pelvis and urinalysis.   10:24 PM Patient has acute renal failure with Cr 6.8, sodium 114.  Bicarb is 12 I talked to Dr. Hollie Salk from nephrology regarding patient.  Since patient does not have any hydronephrosis on CT, she recommend isotonic saline and aggressive hydration. She also recommend treating hyperkalemia. Nephrology will see patient in AM. CT showed signs of COVID and no SBO. Her Rapid COVID is negative. However, high suspicion for COVID so will send 6-24 hr COVID  and treat as if she is COVID positive. [place] preadmission labs sent.   Final Clinical Impression(s) / ED Diagnoses Final diagnoses:  None    Rx / DC Orders ED Discharge Orders    None       Drenda Freeze, MD 10/15/19 2227

## 2019-10-15 NOTE — ED Notes (Signed)
CRITICAL VALUE STICKER  CRITICAL VALUE: 117 Sodium  RECEIVER (on-site recipient of call): Ellie Lunch. RN  DATE & TIME NOTIFIED: 2224 10/15/19  MESSENGER (representative from lab): Rochelle  MD NOTIFIED: Dr. Darl Householder  TIME OF NOTIFICATION: 2225  RESPONSE: Dr. Darl Householder ordered meds for low sodium, he was already aware of value from I-stat results earlier. Will continue to monitor.

## 2019-10-15 NOTE — Progress Notes (Addendum)
Brief consult note:   Pt is a 65F with a PMH sig for HTN, HLD, DM II, CAD, anxiety, and blind loop syndrome s/p ileostomy with high output who we were called for re: acute on chronic kidney disease, hyponatremia, hyperkalemia, and metabolic acidosis.    Briefly, pt was admitted in November for acute on chronic kidney injury d/t hypovolemia related to high output ileostomy.  Losartan held, was hydrated, and ileostomy output slowed with addition of lomotil (which she didn't take on a regular basis at home.  Cr peaked at 3.6--> 1.5 on discharge 08/27/2019.  Also had hyponatremia, mild hyperkalemia, and metabolic acidosis at that time which resolved with IVFs.    She comes into ED today with AMS.  She got together with her family on Christmas Eve where she was found to be acting normally.  She's been talking to her family on the phone and was normal yesterday.  She didn't answer the phone today so family went over and she was found to be confused, on the floor, with a full ostomy bag.  Of note, multiple family members have [place].  Her COVID antigen is negative here.  CT head negative, CT abd/ pelvis with no acute abdominal findings but with GGO in the bilateral bases concerning for pneumonia.  CXR interestingly didn't really show anything.    Labs are notable for the following: Na 114, K 6.2, bicarb 12, BUN 106, Cr 6.80 on iStat.  Serum labs pending.  Leukocytosis to 16.2, Hgb 11.9, plts 284.  Recommendations are as follows: - recommend isotonic fluid resuscitation- has gotten 1L IVFs with NS so far - will start bicarb gtt @ 125 mL/ hr - has gotten Ca, bicarb, and lokelma for hyperK, bicarb gtt will also help this.   - insert Foley to document UOP - q 4 BMP - strict I/O, daily weights - if no improvement with fluid resuscitation may require dialytic intervention--> follow closely  Full consult note to follow.  Please call with questions. Madelon Lips MD [facility] pgr  [phone]

## 2019-10-15 NOTE — ED Notes (Signed)
Myself, Dr. Darl Householder, and IV team have attempted to complete blood work on this patient without success. All blood that has been able to draw has been sent to lab.

## 2019-10-16 ENCOUNTER — Other Ambulatory Visit: Payer: Self-pay

## 2019-10-16 ENCOUNTER — Encounter (HOSPITAL_COMMUNITY): Payer: Self-pay | Admitting: Internal Medicine

## 2019-10-16 ENCOUNTER — Inpatient Hospital Stay (HOSPITAL_COMMUNITY): Payer: 59

## 2019-10-16 DIAGNOSIS — E875 Hyperkalemia: Secondary | ICD-10-CM | POA: Diagnosis present

## 2019-10-16 DIAGNOSIS — J189 Pneumonia, unspecified organism: Secondary | ICD-10-CM | POA: Diagnosis present

## 2019-10-16 DIAGNOSIS — G9341 Metabolic encephalopathy: Secondary | ICD-10-CM

## 2019-10-16 LAB — CBC WITH DIFFERENTIAL/PLATELET
Abs Immature Granulocytes: 0.1 10*3/uL — ABNORMAL HIGH (ref 0.00–0.07)
Basophils Absolute: 0 10*3/uL (ref 0.0–0.1)
Basophils Relative: 0 %
Eosinophils Absolute: 0.1 10*3/uL (ref 0.0–0.5)
Eosinophils Relative: 1 %
HCT: 27.9 % — ABNORMAL LOW (ref 36.0–46.0)
Hemoglobin: 9.9 g/dL — ABNORMAL LOW (ref 12.0–15.0)
Immature Granulocytes: 1 %
Lymphocytes Relative: 8 %
Lymphs Abs: 0.7 10*3/uL (ref 0.7–4.0)
MCH: 28.8 pg (ref 26.0–34.0)
MCHC: 35.5 g/dL (ref 30.0–36.0)
MCV: 81.1 fL (ref 80.0–100.0)
Monocytes Absolute: 0.8 10*3/uL (ref 0.1–1.0)
Monocytes Relative: 8 %
Neutro Abs: 8 10*3/uL — ABNORMAL HIGH (ref 1.7–7.7)
Neutrophils Relative %: 82 %
Platelets: 258 10*3/uL (ref 150–400)
RBC: 3.44 MIL/uL — ABNORMAL LOW (ref 3.87–5.11)
RDW: 12.6 % (ref 11.5–15.5)
WBC: 9.6 10*3/uL (ref 4.0–10.5)
nRBC: 0 % (ref 0.0–0.2)

## 2019-10-16 LAB — CBC
HCT: 30.1 % — ABNORMAL LOW (ref 36.0–46.0)
Hemoglobin: 10.3 g/dL — ABNORMAL LOW (ref 12.0–15.0)
MCH: 28.6 pg (ref 26.0–34.0)
MCHC: 34.2 g/dL (ref 30.0–36.0)
MCV: 83.6 fL (ref 80.0–100.0)
Platelets: 254 10*3/uL (ref 150–400)
RBC: 3.6 MIL/uL — ABNORMAL LOW (ref 3.87–5.11)
RDW: 12.8 % (ref 11.5–15.5)
WBC: 11.3 10*3/uL — ABNORMAL HIGH (ref 4.0–10.5)
nRBC: 0 % (ref 0.0–0.2)

## 2019-10-16 LAB — RENAL FUNCTION PANEL
Albumin: 2.8 g/dL — ABNORMAL LOW (ref 3.5–5.0)
Anion gap: 21 — ABNORMAL HIGH (ref 5–15)
BUN: 92 mg/dL — ABNORMAL HIGH (ref 8–23)
CO2: 12 mmol/L — ABNORMAL LOW (ref 22–32)
Calcium: 8.5 mg/dL — ABNORMAL LOW (ref 8.9–10.3)
Chloride: 89 mmol/L — ABNORMAL LOW (ref 98–111)
Creatinine, Ser: 5.06 mg/dL — ABNORMAL HIGH (ref 0.44–1.00)
GFR calc Af Amer: 9 mL/min — ABNORMAL LOW (ref >60)
GFR calc non Af Amer: 8 mL/min — ABNORMAL LOW (ref >60)
Glucose, Bld: 116 mg/dL — ABNORMAL HIGH (ref 70–99)
Phosphorus: 7.9 mg/dL — ABNORMAL HIGH (ref 2.5–4.6)
Potassium: 4.7 mmol/L (ref 3.5–5.1)
Sodium: 122 mmol/L — ABNORMAL LOW (ref 135–145)

## 2019-10-16 LAB — URINALYSIS, ROUTINE W REFLEX MICROSCOPIC
Bilirubin Urine: NEGATIVE
Glucose, UA: NEGATIVE mg/dL
Ketones, ur: NEGATIVE mg/dL
Nitrite: POSITIVE — AB
Protein, ur: NEGATIVE mg/dL
Specific Gravity, Urine: 1.01 (ref 1.005–1.030)
pH: 5 (ref 5.0–8.0)

## 2019-10-16 LAB — COMPREHENSIVE METABOLIC PANEL
ALT: 20 U/L (ref 0–44)
ALT: 23 U/L (ref 0–44)
AST: 32 U/L (ref 15–41)
AST: 35 U/L (ref 15–41)
Albumin: 2.5 g/dL — ABNORMAL LOW (ref 3.5–5.0)
Albumin: 2.8 g/dL — ABNORMAL LOW (ref 3.5–5.0)
Alkaline Phosphatase: 90 U/L (ref 38–126)
Alkaline Phosphatase: 101 U/L (ref 38–126)
Anion gap: 18 — ABNORMAL HIGH (ref 5–15)
Anion gap: 18 — ABNORMAL HIGH (ref 5–15)
BUN: 90 mg/dL — ABNORMAL HIGH (ref 8–23)
BUN: 80 mg/dL — ABNORMAL HIGH (ref 8–23)
CO2: 14 mmol/L — ABNORMAL LOW (ref 22–32)
CO2: 22 mmol/L (ref 22–32)
Calcium: 8.3 mg/dL — ABNORMAL LOW (ref 8.9–10.3)
Calcium: 8.4 mg/dL — ABNORMAL LOW (ref 8.9–10.3)
Chloride: 91 mmol/L — ABNORMAL LOW (ref 98–111)
Chloride: 86 mmol/L — ABNORMAL LOW (ref 98–111)
Creatinine, Ser: 4.65 mg/dL — ABNORMAL HIGH (ref 0.44–1.00)
Creatinine, Ser: 3.33 mg/dL — ABNORMAL HIGH (ref 0.44–1.00)
GFR calc Af Amer: 10 mL/min — ABNORMAL LOW (ref >60)
GFR calc Af Amer: 15 mL/min — ABNORMAL LOW (ref >60)
GFR calc non Af Amer: 9 mL/min — ABNORMAL LOW (ref >60)
GFR calc non Af Amer: 13 mL/min — ABNORMAL LOW (ref >60)
Glucose, Bld: 141 mg/dL — ABNORMAL HIGH (ref 70–99)
Glucose, Bld: 202 mg/dL — ABNORMAL HIGH (ref 70–99)
Potassium: 4.6 mmol/L (ref 3.5–5.1)
Potassium: 3.8 mmol/L (ref 3.5–5.1)
Sodium: 123 mmol/L — ABNORMAL LOW (ref 135–145)
Sodium: 126 mmol/L — ABNORMAL LOW (ref 135–145)
Total Bilirubin: 0.7 mg/dL (ref 0.3–1.2)
Total Bilirubin: 0.7 mg/dL (ref 0.3–1.2)
Total Protein: 5.7 g/dL — ABNORMAL LOW (ref 6.5–8.1)
Total Protein: 6.4 g/dL — ABNORMAL LOW (ref 6.5–8.1)

## 2019-10-16 LAB — ABO/RH: ABO/RH(D): A POS

## 2019-10-16 LAB — FERRITIN: Ferritin: 133 ng/mL (ref 11–307)

## 2019-10-16 LAB — HIV ANTIBODY (ROUTINE TESTING W REFLEX): HIV Screen 4th Generation wRfx: NONREACTIVE

## 2019-10-16 LAB — TROPONIN I (HIGH SENSITIVITY)
Troponin I (High Sensitivity): 26 ng/L — ABNORMAL HIGH (ref <18)
Troponin I (High Sensitivity): 27 ng/L — ABNORMAL HIGH (ref <18)

## 2019-10-16 LAB — GLUCOSE, CAPILLARY: Glucose-Capillary: 115 mg/dL — ABNORMAL HIGH (ref 70–99)

## 2019-10-16 LAB — C-REACTIVE PROTEIN: CRP: 10 mg/dL — ABNORMAL HIGH (ref <1.0)

## 2019-10-16 LAB — MRSA PCR SCREENING: MRSA by PCR: NEGATIVE

## 2019-10-16 LAB — PHOSPHORUS: Phosphorus: 7.2 mg/dL — ABNORMAL HIGH (ref 2.5–4.6)

## 2019-10-16 LAB — D-DIMER, QUANTITATIVE: D-Dimer, Quant: 3.08 ug/mL-FEU — ABNORMAL HIGH (ref 0.00–0.50)

## 2019-10-16 LAB — BRAIN NATRIURETIC PEPTIDE: B Natriuretic Peptide: 103.1 pg/mL — ABNORMAL HIGH (ref 0.0–100.0)

## 2019-10-16 LAB — SARS CORONAVIRUS 2 (TAT 6-24 HRS): SARS Coronavirus 2: POSITIVE — AB

## 2019-10-16 LAB — LACTIC ACID, PLASMA: Lactic Acid, Venous: 1.5 mmol/L (ref 0.5–1.9)

## 2019-10-16 MED ORDER — MIRTAZAPINE 15 MG PO TABS
15.0000 mg | ORAL_TABLET | Freq: Every day | ORAL | Status: DC
  Administered 2019-10-16: 15 mg via ORAL
  Filled 2019-10-16: qty 1

## 2019-10-16 MED ORDER — CYCLOBENZAPRINE HCL 10 MG PO TABS: 10.0000 mg | ORAL_TABLET | Freq: Three times a day (TID) | ORAL | Status: DC | PRN

## 2019-10-16 MED ORDER — PANTOPRAZOLE SODIUM 40 MG PO TBEC
40.0000 mg | DELAYED_RELEASE_TABLET | Freq: Every day | ORAL | Status: DC
  Administered 2019-10-16 – 2019-10-20 (×5): 40 mg via ORAL
  Filled 2019-10-16 (×6): qty 1

## 2019-10-16 MED ORDER — HYDROXYZINE HCL 25 MG PO TABS: 25.0000 mg | ORAL_TABLET | Freq: Every evening | ORAL | Status: DC | PRN

## 2019-10-16 MED ORDER — PANCRELIPASE (LIP-PROT-AMYL) 40000-126000 UNITS PO CPEP: 1.0000 | ORAL_CAPSULE | Freq: Three times a day (TID) | ORAL | Status: DC

## 2019-10-16 MED ORDER — SODIUM CHLORIDE 0.9 % IV SOLN
1.0000 g | INTRAVENOUS | Status: AC
  Administered 2019-10-17 – 2019-10-19 (×4): 1 g via INTRAVENOUS
  Filled 2019-10-16 (×2): qty 10
  Filled 2019-10-16: qty 1
  Filled 2019-10-16: qty 10
  Filled 2019-10-16: qty 1

## 2019-10-16 MED ORDER — ACETAMINOPHEN 325 MG PO TABS
650.0000 mg | ORAL_TABLET | Freq: Four times a day (QID) | ORAL | Status: DC | PRN
  Administered 2019-10-17: 650 mg via ORAL
  Filled 2019-10-16: qty 2

## 2019-10-16 MED ORDER — IPRATROPIUM-ALBUTEROL 20-100 MCG/ACT IN AERS
1.0000 | INHALATION_SPRAY | Freq: Four times a day (QID) | RESPIRATORY_TRACT | Status: DC
  Administered 2019-10-16 – 2019-10-20 (×17): 1 via RESPIRATORY_TRACT
  Filled 2019-10-16: qty 4

## 2019-10-16 MED ORDER — SODIUM CHLORIDE 0.9 % IV SOLN
100.0000 mg | Freq: Every day | INTRAVENOUS | Status: AC
  Administered 2019-10-17 – 2019-10-20 (×4): 100 mg via INTRAVENOUS
  Filled 2019-10-16: qty 20
  Filled 2019-10-16: qty 100
  Filled 2019-10-16: qty 20
  Filled 2019-10-16: qty 100

## 2019-10-16 MED ORDER — ARIPIPRAZOLE 5 MG PO TABS
5.0000 mg | ORAL_TABLET | Freq: Every day | ORAL | Status: DC
  Administered 2019-10-16 – 2019-10-17 (×2): 5 mg via ORAL
  Filled 2019-10-16 (×2): qty 1

## 2019-10-16 MED ORDER — NITROGLYCERIN 0.4 MG SL SUBL: 0.4000 mg | SUBLINGUAL_TABLET | SUBLINGUAL | Status: DC | PRN

## 2019-10-16 MED ORDER — METOPROLOL SUCCINATE ER 100 MG PO TB24
100.0000 mg | ORAL_TABLET | Freq: Every day | ORAL | Status: DC
  Administered 2019-10-16 – 2019-10-20 (×5): 100 mg via ORAL
  Filled 2019-10-16 (×5): qty 1

## 2019-10-16 MED ORDER — SODIUM CHLORIDE 0.9 % IV SOLN
500.0000 mg | INTRAVENOUS | Status: AC
  Administered 2019-10-17 – 2019-10-19 (×4): 500 mg via INTRAVENOUS
  Filled 2019-10-16 (×4): qty 500

## 2019-10-16 MED ORDER — ASCORBIC ACID 500 MG PO TABS
500.0000 mg | ORAL_TABLET | Freq: Every day | ORAL | Status: DC
  Administered 2019-10-16 – 2019-10-20 (×5): 500 mg via ORAL
  Filled 2019-10-16 (×5): qty 1

## 2019-10-16 MED ORDER — LEVETIRACETAM 500 MG PO TABS
500.0000 mg | ORAL_TABLET | Freq: Two times a day (BID) | ORAL | Status: DC
  Administered 2019-10-16 – 2019-10-20 (×9): 500 mg via ORAL
  Filled 2019-10-16 (×9): qty 1

## 2019-10-16 MED ORDER — CHLORHEXIDINE GLUCONATE CLOTH 2 % EX PADS
6.0000 | MEDICATED_PAD | Freq: Every day | CUTANEOUS | Status: DC
  Administered 2019-10-16 – 2019-10-20 (×5): 6 via TOPICAL

## 2019-10-16 MED ORDER — GLYCOPYRROLATE 1 MG PO TABS
1.0000 mg | ORAL_TABLET | Freq: Two times a day (BID) | ORAL | Status: DC
  Administered 2019-10-16 – 2019-10-17 (×3): 1 mg via ORAL
  Filled 2019-10-16 (×3): qty 1

## 2019-10-16 MED ORDER — DIPHENOXYLATE-ATROPINE 2.5-0.025 MG PO TABS
1.0000 | ORAL_TABLET | Freq: Three times a day (TID) | ORAL | Status: DC
  Administered 2019-10-16 – 2019-10-17 (×4): 1 via ORAL
  Filled 2019-10-16 (×4): qty 1

## 2019-10-16 MED ORDER — CLOPIDOGREL BISULFATE 75 MG PO TABS
75.0000 mg | ORAL_TABLET | Freq: Every day | ORAL | Status: DC
  Administered 2019-10-16 – 2019-10-20 (×5): 75 mg via ORAL
  Filled 2019-10-16 (×5): qty 1

## 2019-10-16 MED ORDER — ATORVASTATIN CALCIUM 20 MG PO TABS
20.0000 mg | ORAL_TABLET | Freq: Every day | ORAL | Status: DC
  Administered 2019-10-16 – 2019-10-20 (×5): 20 mg via ORAL
  Filled 2019-10-16 (×3): qty 2
  Filled 2019-10-16 (×2): qty 1
  Filled 2019-10-16: qty 2
  Filled 2019-10-16 (×2): qty 1
  Filled 2019-10-16: qty 2
  Filled 2019-10-16: qty 1

## 2019-10-16 MED ORDER — ZINC SULFATE 220 (50 ZN) MG PO CAPS
220.0000 mg | ORAL_CAPSULE | Freq: Every day | ORAL | Status: DC
  Administered 2019-10-16 – 2019-10-20 (×5): 220 mg via ORAL
  Filled 2019-10-16 (×5): qty 1

## 2019-10-16 MED ORDER — SODIUM CHLORIDE 0.9 % IV SOLN
200.0000 mg | Freq: Once | INTRAVENOUS | Status: AC
  Administered 2019-10-16: 200 mg via INTRAVENOUS
  Filled 2019-10-16: qty 200

## 2019-10-16 MED ORDER — VENLAFAXINE HCL ER 75 MG PO CP24
150.0000 mg | ORAL_CAPSULE | Freq: Every day | ORAL | Status: DC
  Administered 2019-10-16 – 2019-10-17 (×2): 150 mg via ORAL
  Filled 2019-10-16 (×2): qty 2

## 2019-10-16 MED ORDER — AMLODIPINE BESYLATE 5 MG PO TABS
10.0000 mg | ORAL_TABLET | Freq: Every day | ORAL | Status: DC
  Administered 2019-10-16 – 2019-10-20 (×5): 10 mg via ORAL
  Filled 2019-10-16 (×5): qty 2

## 2019-10-16 MED ORDER — METOPROLOL TARTRATE 5 MG/5ML IV SOLN: 2.5000 mg | Freq: Four times a day (QID) | INTRAVENOUS | Status: DC | PRN

## 2019-10-16 MED ORDER — LIP MEDEX EX OINT: TOPICAL_OINTMENT | CUTANEOUS | Status: DC | PRN

## 2019-10-16 MED ORDER — CYANOCOBALAMIN 500 MCG PO TABS
500.0000 ug | ORAL_TABLET | Freq: Every day | ORAL | Status: DC
  Administered 2019-10-16 – 2019-10-20 (×5): 500 ug via ORAL
  Filled 2019-10-16 (×5): qty 1

## 2019-10-16 MED ORDER — SODIUM BICARBONATE-DEXTROSE 150-5 MEQ/L-% IV SOLN
150.0000 meq | INTRAVENOUS | Status: DC
  Administered 2019-10-16: 150 meq via INTRAVENOUS

## 2019-10-16 MED ORDER — LIP MEDEX EX OINT
TOPICAL_OINTMENT | CUTANEOUS | Status: AC
  Filled 2019-10-16: qty 7

## 2019-10-16 MED ORDER — SODIUM CHLORIDE 0.9 % IV SOLN: INTRAVENOUS | Status: DC

## 2019-10-16 MED ORDER — PANCRELIPASE (LIP-PROT-AMYL) 36000-114000 UNITS PO CPEP
36000.0000 [IU] | ORAL_CAPSULE | Freq: Three times a day (TID) | ORAL | Status: DC
  Administered 2019-10-16 – 2019-10-20 (×13): 36000 [IU] via ORAL
  Filled 2019-10-16 (×14): qty 1

## 2019-10-16 MED ORDER — ASPIRIN EC 81 MG PO TBEC
81.0000 mg | DELAYED_RELEASE_TABLET | Freq: Every day | ORAL | Status: DC
  Administered 2019-10-16 – 2019-10-20 (×5): 81 mg via ORAL
  Filled 2019-10-16 (×5): qty 1

## 2019-10-16 MED ORDER — GUAIFENESIN-DM 100-10 MG/5ML PO SYRP: 10.0000 mL | ORAL_SOLUTION | ORAL | Status: DC | PRN

## 2019-10-16 MED ORDER — DEXAMETHASONE SODIUM PHOSPHATE 10 MG/ML IJ SOLN
6.0000 mg | INTRAMUSCULAR | Status: DC
  Administered 2019-10-16 – 2019-10-20 (×5): 6 mg via INTRAVENOUS
  Filled 2019-10-16 (×5): qty 1

## 2019-10-16 MED ORDER — MONTELUKAST SODIUM 10 MG PO TABS
10.0000 mg | ORAL_TABLET | Freq: Every day | ORAL | Status: DC
  Administered 2019-10-16 – 2019-10-20 (×5): 10 mg via ORAL
  Filled 2019-10-16 (×5): qty 1

## 2019-10-16 NOTE — ED Notes (Signed)
4 Extra gold tops and 2 extra lavender sent to lab

## 2019-10-16 NOTE — Progress Notes (Signed)
   Follow Up Note Pt admitted earlier this morning, please see H&P done earlier in the a.m. 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  Multiple family members tested positive for COVID-19 infection.  In the ED, patient's vital signs were fairly stable, labs showed WBC of 16.2, sodium level of 114, hyperkalemia, acute renal failure with creatinine of 6.8.  Troponin mildly elevated with EKG with no STEMI.  Chest x-ray showed right middle lobe and bilateral basal infiltrate. CT head was negative for acute intracranial pathology.  CT renal stone shows multiple small foci of ill-defined ground-glass density at the right middle lobe and bilateral lung bases suspicious for multifocal infection to include atypical or viral pneumonia.  Of note, patient was previously admitted in 08/2019 for somewhat similar issue.  Patient admitted for further management.    Today, patient does appear lethargic, sleepy, but able to answer simple questions.  Not able to engage in a meaningful conversation.  Patient denies any chest pain, shortness of breath, abdominal pain, nausea/vomiting, fever/chills.  Exam: CV: S1-S2 present Lungs: Diminished breath sounds bilaterally, with bibasilar crackles noted Abd: Soft, nontender, nondistended, bowel sounds present, colostomy bag noted on the right side of her abdomen.  Ostomy appears viable Ext: No pedal edema bilaterally noted  Present on Admission: . Acute metabolic encephalopathy . Acute kidney injury superimposed on CKD ([place]) . Hyperkalemia . Bilateral pneumonia   Assessment/plan  AKI on CKD stage IV with metabolic acidosis/hyperkalemia/hyponatremia Creatinine 6.18, potassium 6.1, sodium 117, bicarb 10 on presentation Renal function, electrolytes improving status post IV fluids, calcium gluconate,  [facility] on board, recommend bicarb drip at 125 mils per hour (Writer currently reduced it to 75 mils per hour due to history of CHF) Frequent BMP Strict I's and O's, daily weights  Acute metabolic encephalopathy likely 2/2 uremia versus CAP, rule out Covid 19 pneumonia Mentation not yet back to baseline Afebrile, with resolving leukocytosis CT head was negative for acute intracranial pathology CT renal stone shows multiple small foci of ill-defined ground-glass density at the right middle lobe and bilateral lung bases suspicious for multifocal infection to include atypical or viral pneumonia POC COVID-19 test negative, awaiting PCR Inflammatory markers, CRP elevated, procalcitonin 0.46, D-dimer elevated, will trend Continue ceftriaxone, azithromycin We will place on airborne/contact precautions for now Continue to monitor in stepdown unit  Mildly elevated troponin Likely in the setting of demand ischemia/CKD Will trend troponin EKG with no acute ST changes  Combined systolic and diastolic HF BNP pending Chest x-ray unremarkable, repeat pending Echo done on 04/30/2019 showed EF of 45 to 41%, diastolic dysfunction, moderate hypokinesis of the left ventricular, basal to mid inferior lateral wall Stress test done on 12/18/2018 was negative for any reversible ischemic changes

## 2019-10-16 NOTE — Consult Note (Addendum)
Renal Service Consult Note [place] 10/16/2019 [place] Requesting Physician:  Dr Horris Latino   Reason for Consult:  Acute on chronic renal failure HPI: The patient is a 74 y.o. year-old with hx of HTN, CVA, seizure, chron pancreatitis, IBS, HL, fatty liverDM, depression presented 1/1 pm to ED for confusion.  H/o mult family members recently tested + for covid. Pt found yest to be confused at home. Has colostomy bag. She was covid neg in ED but PCR test came back + .  In ED VSS, sats okay, WBC 16k, low Na 114, creat 6.8, K 6.  CXR showed bilat infiltrates. CT head no acute, old L parietal infarct. Pt admitted and started on IVFs/ bicarb gtt and got lokelma. Asked to see for renal failure.   Pt is pleasant, disoriented, in no distress. No abd pain or SOB.  Na+ has improved w/ NS to 124 today and creat is down to 4.5.     ROS  denies CP  no joint pain   no HA  no blurry vision  no rash     Past Medical History  Past Medical History:  Diagnosis Date  . Anxiety   . Arthropathy, unspecified, site unspecified   . Blind loop syndrome   . Carotid artery disease ([place])    documented occlusion of left ICA  . Coronary artery disease    a. known 60-70% mid-LAD stenosis with caths in 1999, 2002, 2006, and 2012 showing stable anatomy b. low-risk NST in 10/2015  . Depression   . Diabetes insipidus ([place])   . Diabetes mellitus   . Diverticulosis   . Dizziness    chronic  . Dyslipidemia   . Dyspnea    with exertion  . Fatty liver   . GERD (gastroesophageal reflux disease)    hiatial hernia  . Hepatomegaly   . Hiatal hernia   . HTN (hypertension)   . Hyperlipidemia   . Hypertension   . Irritable bowel syndrome   . Melanosis coli   . Metatarsal fracture right  . Normal nuclear stress test    nml 01/03/10;06/19/07  . Other, mixed, or unspecified nondependent drug abuse, unspecified   . Pancreatitis, chronic ([place])   . Peripheral vascular disease  ([place]) 02/20/10   s/p stent of left SFA  . Seizure ([place]) 11/04/2018  . Sleep apnea   . Stroke ([place])   . Thyroid nodule   . Unspecified essential hypertension   . Vitamin B12 deficiency    Past Surgical History  Past Surgical History:  Procedure Laterality Date  . ABDOMINAL HYSTERECTOMY     partial  . AUGMENTATION MAMMAPLASTY     bilateral 1976 retro   . BREAST ENHANCEMENT SURGERY    . CARDIAC CATHETERIZATION  07/07/98;08/31/01;03/04/05   60 -70% LAD  . CATARACT EXTRACTION    . COLON RESECTION N/A 10/14/2017   Procedure: EXPLORATORY LAPAROTOMY, EXTENDED RIGHT COLECTOMY; APPLICATION OF ABDOMINAL VACUUM DRESSING;  Surgeon: Jovita Kussmaul, MD;  Location: [facility];  Service: General;  Laterality: N/A;  . ENDOBRONCHIAL ULTRASOUND Bilateral 02/24/2017   Procedure: ENDOBRONCHIAL ULTRASOUND;  Surgeon: Collene Gobble, MD;  Location: [facility];  Service: Cardiopulmonary;  Laterality: Bilateral;  . EYE SURGERY Left Nov. 2016   Cataract  . EYE SURGERY Right 2001   Cataract  . FEMORAL ARTERY STENT     Left leg  . LAPAROTOMY N/A 10/16/2017   Procedure: EXPLORATORY LAPAROTOMY, PARTIAL OMENTECTOMY, RESECTION ISCHEMIC ILEUM 102VO, APPLICATION OF VAC  ABDOMINAL DRESSING;  Surgeon: Armandina Gemma, MD;  Location: [facility];  Service: General;  Laterality: N/A;  . LAPAROTOMY N/A 10/18/2017   Procedure: RE-EXPLORATION OF ABDOMEN, ILEOSTOMY CREATION;  Surgeon: Stark Klein, MD;  Location: [facility];  Service: General;  Laterality: N/A;  . LUNG SURGERY  03/2017   Benign polyps removed  . PATELLA REALIGNMENT Left   . TONSILLECTOMY    . TOTAL ABDOMINAL HYSTERECTOMY    . WRIST SURGERY     right   Family History  Family History  Problem Relation Age of Onset  . Hypertension Mother   . Osteoarthritis Mother   . Pulmonary embolism Mother   . Hypertension Father        aneurysm  . Stroke Father   . Stroke Other   . Colon cancer Paternal Grandfather   . Breast cancer Neg Hx    Social History  reports that she  quit smoking about 9 years ago. Her smoking use included cigarettes. She has a 50.00 pack-year smoking history. She has never used smokeless tobacco. She reports that she does not drink alcohol or use drugs. Allergies  Allergies  Allergen Reactions  . Adhesive [Tape] Other (See Comments)    "Took off my skin"  . Codeine Other (See Comments)    Altered mental state   . Iodinated Diagnostic Agents Rash   Home medications Prior to Admission medications   Medication Sig Start Date End Date Taking? Authorizing Provider  acetaminophen (TYLENOL) 325 MG tablet Take 650 mg by mouth every 6 (six) hours as needed for headache (pain).   Yes [provider]  amLODipine (NORVASC) 10 MG tablet Place 1 tablet (10 mg total) into feeding tube daily. Patient taking differently: Take 10 mg by mouth daily.  11/06/17  Yes Florencia Reasons, MD  aspirin EC 81 MG tablet Take 81 mg by mouth daily.   Yes [provider]  atorvastatin (LIPITOR) 20 MG tablet Take 20 mg by mouth daily.   Yes [provider]  clopidogrel (PLAVIX) 75 MG tablet Take 75 mg by mouth daily.   Yes [provider]  cyclobenzaprine (FLEXERIL) 10 MG tablet Take 10 mg by mouth 3 (three) times daily as needed. 09/14/19  Yes [provider]  diphenoxylate-atropine (LOMOTIL) 2.5-0.025 MG tablet Take 1 tablet by mouth 3 (three) times daily. 08/27/19  Yes Domenic Polite, MD  glycopyrrolate (ROBINUL) 1 MG tablet Take 1 tablet (1 mg total) by mouth 2 (two) times daily. 09/22/19  Yes Ladene Artist, MD  hydrOXYzine (ATARAX/VISTARIL) 25 MG tablet Take 25 mg by mouth at bedtime as needed (sleep).   Yes [provider]  levETIRAcetam (KEPPRA) 500 MG tablet TAKE 1 TABLET BY MOUTH TWICE A DAY 10/04/19  Yes Dohmeier, Asencion Partridge, MD  losartan (COZAAR) 100 MG tablet Take 50 mg by mouth daily. 09/28/19  Yes [provider]  metoprolol succinate (TOPROL-XL) 100 MG 24 hr tablet Take 100 mg by mouth daily. Take with  or immediately following a meal.   Yes [provider]  mirtazapine (REMERON) 15 MG tablet Take 15 mg by mouth at bedtime.   Yes [provider]  montelukast (SINGULAIR) 10 MG tablet Take 10 mg by mouth daily. 08/09/18  Yes [provider]  nitroGLYCERIN (NITROSTAT) 0.4 MG SL tablet Place 1 tablet (0.4 mg total) under the tongue every 5 (five) minutes as needed for chest pain. 11/08/15  Yes [place], Peter M, MD  Pancrelipase, Lip-Prot-Amyl, (ZENPEP) [phone] units CPEP Take 1 capsule by  mouth 3 (three) times daily with meals.   Yes [provider]  pantoprazole (PROTONIX) 40 MG tablet Take 40 mg by mouth daily.   Yes [provider]  venlafaxine XR (EFFEXOR-XR) 150 MG 24 hr capsule Take 150 mg by mouth daily.   Yes [provider]  vitamin B-12 (CYANOCOBALAMIN) 500 MCG tablet Take 500 mcg by mouth daily.   Yes [provider]  Vitamin D, Ergocalciferol, (DRISDOL) 1.25 MG (50000 UT) CAPS capsule Take 50,000 Units by mouth every Wednesday.    Yes [provider]  ARIPiprazole (ABILIFY) 5 MG tablet Place 1 tablet (5 mg total) into feeding tube daily. Patient taking differently: Take 5 mg by mouth daily.  11/06/17   Florencia Reasons, MD  esomeprazole (NEXIUM) 40 MG capsule Take 40 mg by mouth daily. 07/29/18   [provider]  loperamide (IMODIUM) 2 MG capsule Take 2 mg by mouth daily as needed for diarrhea or loose stools.     [provider]   Liver Function Tests Recent Labs  Lab 10/15/19 2123 10/16/19 0158 10/16/19 0353  AST 38  --  32  ALT 27  --  20  ALKPHOS 119  --  90  BILITOT 0.6  --  0.7  PROT 7.5  --  5.7*  ALBUMIN 3.2* 2.8* 2.5*   No results for input(s): LIPASE, AMYLASE in the last 168 hours. CBC Recent Labs  Lab 10/15/19 2123 10/15/19 2130 10/16/19 0158  WBC 16.2*  --  11.3*  NEUTROABS 14.0*  --   --   HGB 11.9* 12.6 10.3*  HCT 35.1* 37.0 30.1*  MCV 84.2  --  83.6  PLT 284  --  952    Basic Metabolic Panel Recent Labs  Lab 10/15/19 2123 10/15/19 2130 10/16/19 0158 10/16/19 0353  NA 117* 114* 122* 123*  K 6.1* 6.2* 4.7 4.6  CL 85* 90* 89* 91*  CO2 10*  --  12* 14*  GLUCOSE 98 97 116* 141*  BUN 92* 106* 92* 90*  CREATININE 6.18* 6.80* 5.06* 4.65*  CALCIUM 9.2  --  8.5* 8.3*  PHOS  --   --  7.9* 7.2*   Iron/TIBC/Ferritin/ %Sat    Component Value Date/Time   IRON 55 04/09/2011 1652   FERRITIN 123 10/15/2019 2123   IRONPCTSAT 14.0 (L) 04/09/2011 1652    Vitals:   10/16/19 1000 10/16/19 1105 10/16/19 1200 10/16/19 1300  BP: (!) 100/53 135/66 (!) 137/51 103/63  Pulse: (!) 128 (!) 118 88 (!) 102  Resp: 18 12 (!) 23 15  Temp:  97.8 F (36.6 C)    TempSrc:  Oral    SpO2: 100% 100% 100% 99%  Weight:  75.4 kg    Height:  '5\' 3"'  (1.6 m)      Exam Gen chron ill appearing, lying flat, Ox 2 (hospital, 1980) No rash, cyanosis or gangrene Sclera anicteric, throat clear  No jvd or bruits Chest clear bilat no rales or wheezing RRR no MRG Abd soft ntnd no mass or ascites +bs, RLQ ostomy and healed scars GU foley w/ clear light yellow urine draining in good amts MS no joint effusions or deformity Ext no LE or UE edema, no wounds or ulcers Neuro is alert, as above    Home meds:  - losartan 50 qd/ amlodipine 10/ meoprolol xl 100  - sl ntg prn/ clopidogrel 75/ atorvastatin 20/ aspirin 81  - levetiracetam 500 bid  - mirtazapine 15 hs/ venlafaxine xr 150 qd/ aripiprazole 5  qd  - pantoprazole 40/ montelukast 10  - prn's/ vitamins/ supplements   UA 1/2 > neg protein, many bact/ 20-50 wbc/ 0-5 rbc   CT abd 10/15/19 > bilat kidneys no hydro no atrophy   CXR +infiltrates RML and bilat bases    Date  Creat  eGFR   2008- 12 0.7- 1.0   Jan 2019 2.5 >> 0.8  AKI episode   2019  1.3- 1.21 Oct 2018 1.6- 1.9 26- 30, CKD IV   06/20/19 2.56    Nov 2020 3.6 >> 1.5  AKI episode   10/15/2019 6.1    10/16/19 4.65  today   Assessment/ Plan: 1. AoCKD IV - baseline creat  1.5- 1.9 earlier this year. Pt admitted w/ confusion and COVID+. No shock or nephrotoxins, was on ARB at home. Kidneys by abd CT look normal, no hydro. Creat 6.8 is down to 4.6 early am and 3.3 this afternoon.  Hyperkalemia resolved and Na+ 114 >> 126 now w/ normal saline resuscitation. Cont IVF"s w/NS. Patient has simple AKI on CKD due to vol depletion/ ARB, resolving quickly. 2. COVID+ PNA - per primary, on nasal O2 low dose 3. HTN - resume prn, avoid acei/ ARB w/ AKI 4. H/o CVA - on asa/ plavix at home 5. H/o depression      Kelly Splinter  MD 10/16/2019, 1:03 PM

## 2019-10-16 NOTE — ED Notes (Signed)
ED TO INPATIENT HANDOFF REPORT  ED Nurse Name and Phone #: [phone]  S Name/Age/Gender [name] 74 y.o. female Room/Bed: WA17/WA17  Code Status   Code Status: Full Code  Home/SNF/Other Home Patient oriented to: self Is this baseline? No   Triage Complete: Triage complete  Chief Complaint Acute metabolic encephalopathy [F62.13]  Triage Note Per EMS, pt's daughter states the patient seemed altered today when visiting her. She was last seen normal 12/24. Pt stays alone, is usually able to take care of herself. 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 Pt was alert and oriented to self and place.   HR 120 before fluid, 103 after 600cc BP 125/64 SpO2 92% on 2L, she doesn't require O2 at home CBG 119    Allergies Allergies  Allergen Reactions  . Adhesive [Tape] Other (See Comments)    "Took off my skin"  . Codeine Other (See Comments)    Altered mental state   . Iodinated Diagnostic Agents Rash    Level of Care/Admitting Diagnosis ED Disposition    ED Disposition Condition Comment   Admit  Hospital Area: [facility] [[id number]]  Level of Care: Med-Surg [16]  Covid Evaluation: Symptomatic Person Under Investigation (PUI)  Diagnosis: Acute metabolic encephalopathy [[id number]]  Admitting Physician: Edmonia Lynch [[id number]]  Attending Physician: Bunnie Pion Z [[id number]]  Estimated length of stay: 3 - 4 days  Certification:: I certify this patient will need inpatient services for at least 2 midnights       B Medical/Surgery History Past Medical History:  Diagnosis Date  . Anxiety   . Arthropathy, unspecified, site unspecified   . Blind loop syndrome   . Carotid artery disease ([place])    documented occlusion of left ICA  . Coronary artery disease    a. known 60-70% mid-LAD stenosis with caths in 1999, 2002, 2006, and 2012 showing stable anatomy b. low-risk NST in 10/2015  . Depression   . Diabetes insipidus ([place])   . Diabetes mellitus   .  Diverticulosis   . Dizziness    chronic  . Dyslipidemia   . Dyspnea    with exertion  . Fatty liver   . GERD (gastroesophageal reflux disease)    hiatial hernia  . Hepatomegaly   . Hiatal hernia   . HTN (hypertension)   . Hyperlipidemia   . Hypertension   . Irritable bowel syndrome   . Melanosis coli   . Metatarsal fracture right  . Normal nuclear stress test    nml 01/03/10;06/19/07  . Other, mixed, or unspecified nondependent drug abuse, unspecified   . Pancreatitis, chronic ([place])   . Peripheral vascular disease ([place]) 02/20/10   s/p stent of left SFA  . Seizure ([place]) 11/04/2018  . Sleep apnea   . Stroke ([place])   . Thyroid nodule   . Unspecified essential hypertension   . Vitamin B12 deficiency    Past Surgical History:  Procedure Laterality Date  . ABDOMINAL HYSTERECTOMY     partial  . AUGMENTATION MAMMAPLASTY     bilateral 1976 retro   . BREAST ENHANCEMENT SURGERY    . CARDIAC CATHETERIZATION  07/07/98;08/31/01;03/04/05   60 -70% LAD  . CATARACT EXTRACTION    . COLON RESECTION N/A 10/14/2017   Procedure: EXPLORATORY LAPAROTOMY, EXTENDED RIGHT COLECTOMY; APPLICATION OF ABDOMINAL VACUUM DRESSING;  Surgeon: Jovita Kussmaul, MD;  Location: [facility];  Service: General;  Laterality: N/A;  . ENDOBRONCHIAL ULTRASOUND Bilateral 02/24/2017   Procedure: ENDOBRONCHIAL ULTRASOUND;  Surgeon: Baltazar Apo  S, MD;  Location: [facility];  Service: Cardiopulmonary;  Laterality: Bilateral;  . EYE SURGERY Left Nov. 2016   Cataract  . EYE SURGERY Right 2001   Cataract  . FEMORAL ARTERY STENT     Left leg  . LAPAROTOMY N/A 10/16/2017   Procedure: EXPLORATORY LAPAROTOMY, PARTIAL OMENTECTOMY, RESECTION ISCHEMIC ILEUM 287GO, APPLICATION OF VAC ABDOMINAL DRESSING;  Surgeon: Armandina Gemma, MD;  Location: [facility];  Service: General;  Laterality: N/A;  . LAPAROTOMY N/A 10/18/2017   Procedure: RE-EXPLORATION OF ABDOMEN, ILEOSTOMY CREATION;  Surgeon: Stark Klein, MD;  Location: [facility];  Service: General;   Laterality: N/A;  . LUNG SURGERY  03/2017   Benign polyps removed  . PATELLA REALIGNMENT Left   . TONSILLECTOMY    . TOTAL ABDOMINAL HYSTERECTOMY    . WRIST SURGERY     right     A IV Location/Drains/Wounds Patient Lines/Drains/Airways Status   Active Line/Drains/Airways    Name:   Placement date:   Placement time:   Site:   Days:   Peripheral IV 10/15/19 Left Arm   10/15/19    2036    Arm   1   Peripheral IV 10/15/19 Right;Anterior Forearm   10/15/19    2303    Forearm   1   Negative Pressure Wound Therapy Abdomen Anterior   10/19/17    0830    -   727   Ileostomy Standard (end) RLQ   10/18/17    1014    RLQ   728   Urethral Catheter Ellie Lunch. RN Double-lumen 16 Fr.   10/16/19    0137    Double-lumen   less than 1   Incision (Closed) 10/14/17 Abdomen   10/14/17    0706     732   Pressure Injury 10/17/17 Stage II -  Partial thickness loss of dermis presenting as a shallow open ulcer with a red, pink wound bed without slough. Deep purple in color with broken blisters.    10/17/17    2110     729   Pressure Injury 10/17/17 Stage II -  Partial thickness loss of dermis presenting as a shallow open ulcer with a red, pink wound bed without slough. deep purple with open blisters    10/17/17    2113     729   Pressure Injury 10/22/17 Unstageable - Full thickness tissue loss in which the base of the ulcer is covered by slough (yellow, tan, gray, green or brown) and/or eschar (tan, brown or black) in the wound bed. areas of black and white on tongue   10/22/17    0442     724   Pressure Injury 10/28/17 Stage II -  Partial thickness loss of dermis presenting as a shallow open ulcer with a red, pink wound bed without slough.   10/28/17    1600     718          Intake/Output Last 24 hours  Intake/Output Summary (Last 24 hours) at 10/16/2019 1010 Last data filed at 10/16/2019 0204 Gross per 24 hour  Intake 1136.44 ml  Output -  Net 1136.44 ml    Labs/Imaging Results for orders placed or  performed during the hospital encounter of 10/15/19 (from the past 48 hour(s))  CBC with Differential     Status: Abnormal   Collection Time: 10/15/19  9:23 PM  Result Value Ref Range   WBC 16.2 (H) 4.0 - 10.5 K/uL   RBC 4.17 3.87 - 5.11  MIL/uL   Hemoglobin 11.9 (L) 12.0 - 15.0 g/dL   HCT 35.1 (L) 36.0 - 46.0 %   MCV 84.2 80.0 - 100.0 fL   MCH 28.5 26.0 - 34.0 pg   MCHC 33.9 30.0 - 36.0 g/dL   RDW 12.8 11.5 - 15.5 %   Platelets 284 150 - 400 K/uL   nRBC 0.1 0.0 - 0.2 %   Neutrophils Relative % 86 %   Neutro Abs 14.0 (H) 1.7 - 7.7 K/uL   Lymphocytes Relative 7 %   Lymphs Abs 1.2 0.7 - 4.0 K/uL   Monocytes Relative 6 %   Monocytes Absolute 0.9 0.1 - 1.0 K/uL   Eosinophils Relative 0 %   Eosinophils Absolute 0.0 0.0 - 0.5 K/uL   Basophils Relative 0 %   Basophils Absolute 0.0 0.0 - 0.1 K/uL   Immature Granulocytes 1 %   Abs Immature Granulocytes 0.16 (H) 0.00 - 0.07 K/uL    Comment: Performed at [facility], [place] [address], [place], [place] [address]  Comprehensive metabolic panel     Status: Abnormal   Collection Time: 10/15/19  9:23 PM  Result Value Ref Range   Sodium 117 (LL) 135 - 145 mmol/L    Comment: CRITICAL RESULT CALLED TO, READ BACK BY AND VERIFIED WITH: N MOSCINSKI,RN 10/15/19 2225 RHOLMES    Potassium 6.1 (H) 3.5 - 5.1 mmol/L   Chloride 85 (L) 98 - 111 mmol/L   CO2 10 (L) 22 - 32 mmol/L   Glucose, Bld 98 70 - 99 mg/dL   BUN 92 (H) 8 - 23 mg/dL   Creatinine, Ser 6.18 (H) 0.44 - 1.00 mg/dL   Calcium 9.2 8.9 - 10.3 mg/dL   Total Protein 7.5 6.5 - 8.1 g/dL   Albumin 3.2 (L) 3.5 - 5.0 g/dL   AST 38 15 - 41 U/L   ALT 27 0 - 44 U/L   Alkaline Phosphatase 119 38 - 126 U/L   Total Bilirubin 0.6 0.3 - 1.2 mg/dL   GFR calc non Af Amer 6 (L) >60 mL/min   GFR calc Af Amer 7 (L) >60 mL/min   Anion gap 22 (H) 5 - 15    Comment: REPEATED TO VERIFY Performed at [facility], [place] [address], [place], [place] [address]   Lactic acid,  plasma     Status: None   Collection Time: 10/15/19  9:23 PM  Result Value Ref Range   Lactic Acid, Venous 1.9 0.5 - 1.9 mmol/L    Comment: Performed at [facility], [place] [address], [place], [place] [address]  Blood culture (routine x 2)     Status: None (Preliminary result)   Collection Time: 10/15/19  9:23 PM   Specimen: BLOOD  Result Value Ref Range   Specimen Description      BLOOD LEFT ANTECUBITAL Performed at [facility], [place] [address] [place], [place] [address]    Special Requests      BOTTLES DRAWN AEROBIC AND ANAEROBIC Blood Culture adequate volume Performed at [place] [address] [place], [place] [address]    Culture      NO GROWTH < 12 HOURS Performed at [facility] [address], [place], [place] [address]    Report Status PENDING   Troponin I (High Sensitivity)     Status: Abnormal   Collection Time: 10/15/19  9:23 PM  Result Value Ref Range   Troponin I (High Sensitivity) 23 (  H) <18 ng/L    Comment: (NOTE) Elevated high sensitivity troponin I (hsTnI) values and significant  changes across serial measurements may suggest ACS but many other  chronic and acute conditions are known to elevate hsTnI results.  Refer to the "Links" section for chest pain algorithms and additional  guidance. Performed at [facility], [place] [address], [place], [place] [address]   Ethanol     Status: None   Collection Time: 10/15/19  9:23 PM  Result Value Ref Range   Alcohol, Ethyl (B) <10 <10 mg/dL    Comment: (NOTE) Lowest detectable limit for serum alcohol is 10 mg/dL. For medical purposes only. Performed at [facility], [place] [address], [place], [place] [address]   D-dimer, quantitative     Status: Abnormal   Collection Time: 10/15/19  9:23 PM  Result Value Ref Range   D-Dimer, Quant 3.01 (H) 0.00 - 0.50 ug/mL-FEU    Comment: (NOTE) At the manufacturer cut-off  of 0.50 ug/mL FEU, this assay has been documented to exclude PE with a sensitivity and negative predictive value of 97 to 99%.  At this time, this assay has not been approved by the FDA to exclude DVT/VTE. Results should be correlated with clinical presentation. Performed at [facility], [place] [address] [place], [place] [address]   Procalcitonin     Status: None   Collection Time: 10/15/19  9:23 PM  Result Value Ref Range   Procalcitonin 0.46 ng/mL    Comment:        Interpretation: PCT (Procalcitonin) <= 0.5 ng/mL: Systemic infection (sepsis) is not likely. Local bacterial infection is possible. (NOTE)       Sepsis PCT Algorithm           Lower Respiratory Tract                                      Infection PCT Algorithm    ----------------------------     ----------------------------         PCT < 0.25 ng/mL                PCT < 0.10 ng/mL         Strongly encourage             Strongly discourage   discontinuation of antibiotics    initiation of antibiotics    ----------------------------     -----------------------------       PCT 0.25 - 0.50 ng/mL            PCT 0.10 - 0.25 ng/mL               OR       >80% decrease in PCT            Discourage initiation of                                            antibiotics      Encourage discontinuation           of antibiotics    ----------------------------     -----------------------------         PCT >= 0.50 ng/mL              PCT 0.26 - 0.50 ng/mL  AND        <80% decrease in PCT             Encourage initiation of                                             antibiotics       Encourage continuation           of antibiotics    ----------------------------     -----------------------------        PCT >= 0.50 ng/mL                  PCT > 0.50 ng/mL               AND         increase in PCT                  Strongly encourage                                      initiation of antibiotics     Strongly encourage escalation           of antibiotics                                     -----------------------------                                           PCT <= 0.25 ng/mL                                                 OR                                        > 80% decrease in PCT                                     Discontinue / Do not initiate                                             antibiotics Performed at [place] [address], [place], [place] [address]   Lactate dehydrogenase     Status: None   Collection Time: 10/15/19  9:23 PM  Result Value Ref Range   LDH 162 98 - 192 U/L    Comment: Performed at [facility], [place] [address], [place], [place] [address]  Ferritin     Status: None   Collection Time: 10/15/19  9:23 PM  Result Value Ref Range   Ferritin 123 11 - 307 ng/mL    Comment: Performed at [facility], [address] [place], [place] [address]  Triglycerides     Status: Abnormal   Collection Time: 10/15/19  9:23 PM  Result Value Ref Range   Triglycerides 215 (H) <150 mg/dL    Comment: Performed at [facility], [place] [address], [place], [place] [address]  Fibrinogen     Status: Abnormal   Collection Time: 10/15/19  9:23 PM  Result Value Ref Range   Fibrinogen 785 (H) 210 - 475 mg/dL    Comment: Performed at [facility], [place] [address] [place], [place] [address]  C-reactive protein     Status: Abnormal   Collection Time: 10/15/19  9:23 PM  Result Value Ref Range   CRP 9.9 (H) <1.0 mg/dL    Comment: Performed at [facility], [place] [address], [place], [place] [address]  I-stat chem 8, ED (not at [facility] or [facility])     Status: Abnormal   Collection Time: 10/15/19  9:30 PM  Result Value Ref Range   Sodium 114 (LL) 135 - 145 mmol/L   Potassium 6.2 (H) 3.5 - 5.1 mmol/L   Chloride 90 (L) 98 - 111 mmol/L   BUN 106 (H) 8 - 23 mg/dL    Creatinine, Ser 6.80 (H) 0.44 - 1.00 mg/dL   Glucose, Bld 97 70 - 99 mg/dL   Calcium, Ion 1.10 (L) 1.15 - 1.40 mmol/L   TCO2 12 (L) 22 - 32 mmol/L   Hemoglobin 12.6 12.0 - 15.0 g/dL   HCT 37.0 36.0 - 46.0 %   Comment NOTIFIED PHYSICIAN   POC SARS Coronavirus 2 Ag-ED - Nasal Swab (BD Veritor Kit)     Status: None   Collection Time: 10/15/19 10:13 PM  Result Value Ref Range   SARS Coronavirus 2 Ag NEGATIVE NEGATIVE    Comment: (NOTE) SARS-CoV-2 antigen NOT DETECTED.  Negative results are presumptive.  Negative results do not preclude SARS-CoV-2 infection and should not be used as the sole basis for treatment or other patient management decisions, including infection  control decisions, particularly in the presence of clinical signs and  symptoms consistent with COVID-19, or in those who have been in contact with the virus.  Negative results must be combined with clinical observations, patient history, and epidemiological information. The expected result is Negative. Fact Sheet for Patients: PodPark.tn Fact Sheet for Healthcare Providers: GiftContent.is This test is not yet approved or cleared by the [place] FDA and  has been authorized for detection and/or diagnosis of SARS-CoV-2 by FDA under an Emergency Use Authorization (EUA).  This EUA will remain in effect (meaning this test can be used) for the duration of  the COVID-19 de claration under Section 564(b)(1) of the Act, 21 U.S.C. section 360bbb-3(b)(1), unless the authorization is terminated or revoked sooner.   Lactic acid, plasma     Status: None   Collection Time: 10/15/19 11:30 PM  Result Value Ref Range   Lactic Acid, Venous 1.5 0.5 - 1.9 mmol/L    Comment: Performed at [facility], [place] [address], [place], [place] [address]  Urinalysis, Routine w reflex microscopic     Status: Abnormal   Collection Time: 10/16/19  1:31 AM  Result Value  Ref Range   Color, Urine YELLOW YELLOW   APPearance HAZY (A) CLEAR   Specific Gravity, Urine 1.010 1.005 - 1.030   pH 5.0 5.0 - 8.0   Glucose, UA NEGATIVE NEGATIVE mg/dL   Hgb urine dipstick MODERATE (A) NEGATIVE   Bilirubin Urine  NEGATIVE NEGATIVE   Ketones, ur NEGATIVE NEGATIVE mg/dL   Protein, ur NEGATIVE NEGATIVE mg/dL   Nitrite POSITIVE (A) NEGATIVE   Leukocytes,Ua LARGE (A) NEGATIVE   RBC / HPF 0-5 0 - 5 RBC/hpf   WBC, UA 21-50 0 - 5 WBC/hpf   Bacteria, UA MANY (A) NONE SEEN   WBC Clumps PRESENT    Mucus PRESENT     Comment: Performed at [facility], [place] [address] [place], [place] [address]  Renal function panel     Status: Abnormal   Collection Time: 10/16/19  1:58 AM  Result Value Ref Range   Sodium 122 (L) 135 - 145 mmol/L   Potassium 4.7 3.5 - 5.1 mmol/L    Comment: DELTA CHECK NOTED   Chloride 89 (L) 98 - 111 mmol/L   CO2 12 (L) 22 - 32 mmol/L   Glucose, Bld 116 (H) 70 - 99 mg/dL   BUN 92 (H) 8 - 23 mg/dL   Creatinine, Ser 5.06 (H) 0.44 - 1.00 mg/dL   Calcium 8.5 (L) 8.9 - 10.3 mg/dL   Phosphorus 7.9 (H) 2.5 - 4.6 mg/dL   Albumin 2.8 (L) 3.5 - 5.0 g/dL   GFR calc non Af Amer 8 (L) >60 mL/min   GFR calc Af Amer 9 (L) >60 mL/min   Anion gap 21 (H) 5 - 15    Comment: REPEATED TO VERIFY Performed at [place] [address] [place], [place] [address]   CBC     Status: Abnormal   Collection Time: 10/16/19  1:58 AM  Result Value Ref Range   WBC 11.3 (H) 4.0 - 10.5 K/uL   RBC 3.60 (L) 3.87 - 5.11 MIL/uL   Hemoglobin 10.3 (L) 12.0 - 15.0 g/dL   HCT 30.1 (L) 36.0 - 46.0 %   MCV 83.6 80.0 - 100.0 fL   MCH 28.6 26.0 - 34.0 pg   MCHC 34.2 30.0 - 36.0 g/dL   RDW 12.8 11.5 - 15.5 %   Platelets 254 150 - 400 K/uL   nRBC 0.0 0.0 - 0.2 %    Comment: Performed at [facility], [place] [address] [place], [place] [address]  Troponin I (High Sensitivity)     Status: Abnormal   Collection Time: 10/16/19  3:53 AM   Result Value Ref Range   Troponin I (High Sensitivity) 26 (H) <18 ng/L    Comment: (NOTE) Elevated high sensitivity troponin I (hsTnI) values and significant  changes across serial measurements may suggest ACS but many other  chronic and acute conditions are known to elevate hsTnI results.  Refer to the "Links" section for chest pain algorithms and additional  guidance. Performed at [facility] [place] [address], [place], [place] [address]   Comprehensive metabolic panel     Status: Abnormal   Collection Time: 10/16/19  3:53 AM  Result Value Ref Range   Sodium 123 (L) 135 - 145 mmol/L   Potassium 4.6 3.5 - 5.1 mmol/L   Chloride 91 (L) 98 - 111 mmol/L   CO2 14 (L) 22 - 32 mmol/L   Glucose, Bld 141 (H) 70 - 99 mg/dL   BUN 90 (H) 8 - 23 mg/dL   Creatinine, Ser 4.65 (H) 0.44 - 1.00 mg/dL   Calcium 8.3 (L) 8.9 - 10.3 mg/dL   Total Protein 5.7 (L) 6.5 - 8.1 g/dL   Albumin 2.5 (L) 3.5 - 5.0 g/dL   AST 32 15 - 41 U/L  ALT 20 0 - 44 U/L   Alkaline Phosphatase 90 38 - 126 U/L   Total Bilirubin 0.7 0.3 - 1.2 mg/dL   GFR calc non Af Amer 9 (L) >60 mL/min   GFR calc Af Amer 10 (L) >60 mL/min   Anion gap 18 (H) 5 - 15    Comment: Performed at [facility], [place] [address], [place], [place] [address]  Phosphorus     Status: Abnormal   Collection Time: 10/16/19  3:53 AM  Result Value Ref Range   Phosphorus 7.2 (H) 2.5 - 4.6 mg/dL    Comment: Performed at [facility], [place] [address], [place], [place] [address]   CT Head Wo Contrast  Result Date: 10/15/2019 CLINICAL DATA:  Weakness, altered mental status EXAM: CT HEAD WITHOUT CONTRAST TECHNIQUE: Contiguous axial images were obtained from the base of the skull through the vertex without intravenous contrast. COMPARISON:  CT brain 06/20/2019 FINDINGS: Brain: No acute territorial infarction, hemorrhage or intracranial mass. Mild atrophy. Chronic infarct within the left parietal white  matter. Mild small vessel ischemic changes of the white matter. Stable ventricle size Vascular: No hyperdense vessels. Vertebral and carotid vascular calcification Skull: Normal. Negative for fracture or focal lesion. Sinuses/Orbits: No acute finding. Other: None IMPRESSION: 1. No CT evidence for acute intracranial abnormality. 2. Atrophy and minimal small vessel ischemic change of the white matter. Chronic infarct within the left parietal white matter. Electronically Signed   By: Donavan Foil M.D.   On: 10/15/2019 20:32   DG Chest Port 1 View  Result Date: 10/15/2019 CLINICAL DATA:  Altered mental status. Ex-smoker, hypertension EXAM: PORTABLE CHEST 1 VIEW COMPARISON:  Chest radiograph 08/24/2019 FINDINGS: Stable cardiomediastinal contours with normal heart size. The lungs are clear. No pneumothorax or large pleural effusion. No acute finding in the visualized skeleton. IMPRESSION: No acute cardiopulmonary finding. Electronically Signed   By: Audie Pinto M.D.   On: 10/15/2019 20:02   CT Renal Stone Study  Result Date: 10/15/2019 CLINICAL DATA:  Flank pain history of colostomy EXAM: CT ABDOMEN AND PELVIS WITHOUT CONTRAST TECHNIQUE: Multidetector CT imaging of the abdomen and pelvis was performed following the standard protocol without IV contrast. COMPARISON:  CT 06/20/2019, 11/21/2017 FINDINGS: Lower chest: Lung bases demonstrate scattered foci of ill-defined ground-glass density in the right middle lobe and bilateral lung bases. No pleural effusion. Heart size within normal limits. Coronary vascular calcification. Bilateral breast prostheses. Possible small amount of fluid around the left breast implant. Hepatobiliary: No focal liver abnormality is seen. No gallstones, gallbladder wall thickening, or biliary dilatation. Pancreas: Atrophic.  No inflammatory change or ductal dilatation Spleen: Normal in size without focal abnormality. Adrenals/Urinary Tract: Adrenal glands are within normal limits.  Kidneys show no hydronephrosis. Possible small cystocele. Bladder otherwise normal Stomach/Bowel: Status post right hemicolectomy and right lower quadrant ileostomy. Negative for bowel wall thickening Vascular/Lymphatic: Extensive aortic atherosclerosis. No aneurysm. No suspicious adenopathy Reproductive: Status post hysterectomy. No adnexal masses. Other: Negative for free air or free fluid. Small upper abdominal ventral hernia containing fat. Moderate left paramedian ventral hernia containing fat. Slightly caudal to this is a moderate to large left paramedian ventral hernia containing fat and surgically blind ending transverse colon. Similar infiltration of the left abdominal subcutaneous fat. Small left abdominal fat containing density, series 3, image number 43, probable small area of fat necrosis, no change. Musculoskeletal: Degenerative changes. No acute or suspicious abnormality. IMPRESSION: 1. Multiple small foci of ill-defined ground-glass density at the right middle  lobe and bilateral lung bases suspicious for multifocal infection, to include atypical or viral pneumonia. 2. Previous right hemicolectomy and right lower quadrant ileostomy without evidence for obstruction or bowel wall thickening. 3. Multiple ventral hernias as described above. Largest most caudal hernia contains fat and a portion of blind ending colon. Electronically Signed   By: Donavan Foil M.D.   On: 10/15/2019 20:46    Pending Labs Unresulted Labs (From admission, onward)    Start     Ordered   10/15/19 2338  HIV Antibody (routine testing w rflx)  (HIV Antibody (Routine testing w reflex) panel)  Once,   STAT     10/15/19 2341   10/15/19 2224  SARS CORONAVIRUS 2 (TAT 6-24 HRS) Nasopharyngeal Nasopharyngeal Swab  (Tier 3 (TAT 6-24 hrs))  Once,   STAT    Question Answer Comment  Is this test for diagnosis or screening Screening   Symptomatic for COVID-19 as defined by CDC No   Hospitalized for COVID-19 No   Admitted to ICU for  COVID-19 No   Previously tested for COVID-19 Yes   Resident in a congregate (group) care setting No   Employed in healthcare setting No   Pregnant No      10/15/19 2224   10/15/19 1953  Rapid urine drug screen (hospital performed)  ONCE - STAT,   STAT     10/15/19 1952   10/15/19 1933  Urine culture  ONCE - STAT,   STAT     10/15/19 1932   10/15/19 1933  Blood culture (routine x 2)  BLOOD CULTURE X 2,   STAT     10/15/19 1932          Vitals/Pain Today's Vitals   10/16/19 0430 10/16/19 0500 10/16/19 0830 10/16/19 0843  BP: (!) 109/58 106/62 139/67   Pulse: (!) 118  (!) 128   Resp: '16 17 12   ' Temp:      TempSrc:      SpO2: 100%  100%   PainSc:    0-No pain    Isolation Precautions Droplet and Contact precautions  Medications Medications  sodium bicarbonate 150 mEq in dextrose 5% 1000 mL infusion (150 mEq Intravenous New Bag/Given 10/16/19 0117)  heparin injection 5,000 Units (5,000 Units Subcutaneous Given 10/16/19 0135)  azithromycin (ZITHROMAX) 500 mg in sodium chloride 0.9 % 250 mL IVPB (has no administration in time range)  cefTRIAXone (ROCEPHIN) 1 g in sodium chloride 0.9 % 100 mL IVPB (has no administration in time range)  acetaminophen (TYLENOL) tablet 650 mg (has no administration in time range)  amLODipine (NORVASC) tablet 10 mg (has no administration in time range)  ARIPiprazole (ABILIFY) tablet 5 mg (has no administration in time range)  aspirin EC tablet 81 mg (has no administration in time range)  clopidogrel (PLAVIX) tablet 75 mg (has no administration in time range)  atorvastatin (LIPITOR) tablet 20 mg (has no administration in time range)  cyclobenzaprine (FLEXERIL) tablet 10 mg (has no administration in time range)  diphenoxylate-atropine (LOMOTIL) 2.5-0.025 MG per tablet 1 tablet (has no administration in time range)  glycopyrrolate (ROBINUL) tablet 1 mg (has no administration in time range)  hydrOXYzine (ATARAX/VISTARIL) tablet 25 mg (has no  administration in time range)  levETIRAcetam (KEPPRA) tablet 500 mg (has no administration in time range)  metoprolol succinate (TOPROL-XL) 24 hr tablet 100 mg (has no administration in time range)  mirtazapine (REMERON) tablet 15 mg (has no administration in time range)  montelukast (SINGULAIR) tablet 10 mg (has  no administration in time range)  nitroGLYCERIN (NITROSTAT) SL tablet 0.4 mg (has no administration in time range)  Pancrelipase (Lip-Prot-Amyl) 40000-126000 units CPEP 1 capsule (has no administration in time range)  pantoprazole (PROTONIX) EC tablet 40 mg (has no administration in time range)  venlafaxine XR (EFFEXOR-XR) 24 hr capsule 150 mg (has no administration in time range)  vitamin B-12 (CYANOCOBALAMIN) tablet 500 mcg (has no administration in time range)  sodium chloride 0.9 % bolus 1,000 mL (0 mLs Intravenous Stopped 10/16/19 0059)  sodium zirconium cyclosilicate (LOKELMA) packet 10 g (10 g Oral Given 10/15/19 2312)  sodium bicarbonate injection 50 mEq (50 mEq Intravenous Given 10/15/19 2317)  calcium gluconate 1 g/ 50 mL sodium chloride IVPB (0 g Intravenous Stopped 10/16/19 0014)    Mobility walks Low fall risk   Focused Assessments Neuro Assessment Handoff:  Swallow screen pass? No  Cardiac Rhythm: Sinus tachycardia       Neuro Assessment:   Neuro Checks:      Last Documented NIHSS Modified Score:   Has TPA been given? No If patient is a Neuro Trauma and patient is going to OR before floor call report to [place] nurse: [phone] or [phone]  , .   R Recommendations: See Admitting Provider Note  Report given to:   Additional Notes:

## 2019-10-16 NOTE — H&P (Signed)
History and Physical    [name] DOB: [birth year] DOA: 10/15/2019  PCP: Leanna Battles, MD (Confirm with patient/family/NH records and if not entered, this has to be entered at [facility] point of entry) Patient coming from: Home  I have personally briefly reviewed patient's old medical records in [place]  Chief Complaint: Altered mental status  HPI: [name] is a 74 y.o. female with medical history significant of hypertension, hyperlipidemia, hiatal hernia, blind loop syndrome and diverticulosis scented to ED for evaluation of worsening confusion.  As per ED physician patient was with her family during Christmas and multiple family members including her daughter are tested positive for COVID-19 infection.  Patient was in close contact with all the family members on telephone but did not answer the phone today so the family member went to check on her and she was found confused and altered.  Family members reported to the ED physician that they do not know whether she took her home medications or not but they found her laying on the floor and her conversation was not making any sense.  Patient also had a colostomy bag that was full of stool per patient's family.  During my evaluation patient is well awake and her confusion is improved and able to answers all the questions appropriately.  Patient is oriented to place and person but not to time.  Patient is denying any fever, chills, headache, dizziness, cough, sore throat, loss of smell and taste sensation, chest pain, nausea, vomiting, abdominal pain, urinary symptoms and anxiety.  ED Course: In ED on arrival patient had blood pressure of 145/76, heart rate 96, respiratory rate 13 and saturating well on room air.  Blood work showed leukocytosis with WBC count of 16.2, hyponatremia with sodium level of 114, hyperkalemia with potassium level greater than 6 and severe acute renal failure with creatinine 6.8.  Troponin is also  elevated but EKG showed borderline intraventricular conduction delay and borderline low voltage.  No significant changes from the last EKG.  CT head was negative for acute intracranial pathology and showed chronic infarct in left parietal lobe.  Chest x-ray showed right middle lobe and bilateral bases infiltrates.  The patient was given IV fluids with normal saline, started on bicarb drip and given Lokelma.  Patient was also evaluated by nephrologist who recommended bicarb drip and also commended BMP every 4 hour.  Nephrologist recommended that patient will need dialysis if no improvement with fluids.  Review of Systems: As per HPI otherwise 10 point review of systems negative.   Past Medical History:  Diagnosis Date  . Anxiety   . Arthropathy, unspecified, site unspecified   . Blind loop syndrome   . Carotid artery disease ([place])    documented occlusion of left ICA  . Coronary artery disease    a. known 60-70% mid-LAD stenosis with caths in 1999, 2002, 2006, and 2012 showing stable anatomy b. low-risk NST in 10/2015  . Depression   . Diabetes insipidus ([place])   . Diabetes mellitus   . Diverticulosis   . Dizziness    chronic  . Dyslipidemia   . Dyspnea    with exertion  . Fatty liver   . GERD (gastroesophageal reflux disease)    hiatial hernia  . Hepatomegaly   . Hiatal hernia   . HTN (hypertension)   . Hyperlipidemia   . Hypertension   . Irritable bowel syndrome   . Melanosis coli   . Metatarsal fracture right  .  Normal nuclear stress test    nml 01/03/10;06/19/07  . Other, mixed, or unspecified nondependent drug abuse, unspecified   . Pancreatitis, chronic ([place])   . Peripheral vascular disease ([place]) 02/20/10   s/p stent of left SFA  . Seizure ([place]) 11/04/2018  . Sleep apnea   . Stroke ([place])   . Thyroid nodule   . Unspecified essential hypertension   . Vitamin B12 deficiency     Past Surgical History:  Procedure Laterality Date  . ABDOMINAL HYSTERECTOMY     partial  .  AUGMENTATION MAMMAPLASTY     bilateral 1976 retro   . BREAST ENHANCEMENT SURGERY    . CARDIAC CATHETERIZATION  07/07/98;08/31/01;03/04/05   60 -70% LAD  . CATARACT EXTRACTION    . COLON RESECTION N/A 10/14/2017   Procedure: EXPLORATORY LAPAROTOMY, EXTENDED RIGHT COLECTOMY; APPLICATION OF ABDOMINAL VACUUM DRESSING;  Surgeon: Jovita Kussmaul, MD;  Location: [facility] ORS;  Service: General;  Laterality: N/A;  . ENDOBRONCHIAL ULTRASOUND Bilateral 02/24/2017   Procedure: ENDOBRONCHIAL ULTRASOUND;  Surgeon: Collene Gobble, MD;  Location: [facility] END[facility];  Service: Cardiopulmonary;  Laterality: Bilateral;  . EYE SURGERY Left Nov. 2016   Cataract  . EYE SURGERY Right 2001   Cataract  . FEMORAL ARTERY STENT     Left leg  . LAPAROTOMY N/A 10/16/2017   Procedure: EXPLORATORY LAPAROTOMY, PARTIAL OMENTECTOMY, RESECTION ISCHEMIC ILEUM 314HF, APPLICATION OF VAC ABDOMINAL DRESSING;  Surgeon: Armandina Gemma, MD;  Location: [facility];  Service: General;  Laterality: N/A;  . LAPAROTOMY N/A 10/18/2017   Procedure: RE-EXPLORATION OF ABDOMEN, ILEOSTOMY CREATION;  Surgeon: Stark Klein, MD;  Location: [facility];  Service: General;  Laterality: N/A;  . LUNG SURGERY  03/2017   Benign polyps removed  . PATELLA REALIGNMENT Left   . TONSILLECTOMY    . TOTAL ABDOMINAL HYSTERECTOMY    . WRIST SURGERY     right     reports that she quit smoking about 9 years ago. Her smoking use included cigarettes. She has a 50.00 pack-year smoking history. She has never used smokeless tobacco. She reports that she does not drink alcohol or use drugs.  Allergies  Allergen Reactions  . Adhesive [Tape] Other (See Comments)    "Took off my skin"  . Codeine Other (See Comments)    Altered mental state   . Iodinated Diagnostic Agents Rash    Family History  Problem Relation Age of Onset  . Hypertension Mother   . Osteoarthritis Mother   . Pulmonary embolism Mother   . Hypertension Father        aneurysm  . Stroke Father   . Stroke Other   .  Colon cancer Paternal Grandfather   . Breast cancer Neg Hx      Prior to Admission medications   Medication Sig Start Date End Date Taking? Authorizing Provider  acetaminophen (TYLENOL) 325 MG tablet Take 650 mg by mouth every 6 (six) hours as needed for headache (pain).   Yes [provider]  amLODipine (NORVASC) 10 MG tablet Place 1 tablet (10 mg total) into feeding tube daily. Patient taking differently: Take 10 mg by mouth daily.  11/06/17  Yes Florencia Reasons, MD  aspirin EC 81 MG tablet Take 81 mg by mouth daily.   Yes [provider]  atorvastatin (LIPITOR) 20 MG tablet Take 20 mg by mouth daily.   Yes [provider]  clopidogrel (PLAVIX) 75 MG tablet Take 75 mg by mouth daily.   Yes [provider]  cyclobenzaprine (FLEXERIL)  10 MG tablet Take 10 mg by mouth 3 (three) times daily as needed. 09/14/19  Yes [provider]  diphenoxylate-atropine (LOMOTIL) 2.5-0.025 MG tablet Take 1 tablet by mouth 3 (three) times daily. 08/27/19  Yes Domenic Polite, MD  glycopyrrolate (ROBINUL) 1 MG tablet Take 1 tablet (1 mg total) by mouth 2 (two) times daily. 09/22/19  Yes Ladene Artist, MD  hydrOXYzine (ATARAX/VISTARIL) 25 MG tablet Take 25 mg by mouth at bedtime as needed (sleep).   Yes [provider]  levETIRAcetam (KEPPRA) 500 MG tablet TAKE 1 TABLET BY MOUTH TWICE A DAY 10/04/19  Yes Dohmeier, Asencion Partridge, MD  losartan (COZAAR) 100 MG tablet Take 50 mg by mouth daily. 09/28/19  Yes [provider]  metoprolol succinate (TOPROL-XL) 100 MG 24 hr tablet Take 100 mg by mouth daily. Take with or immediately following a meal.   Yes [provider]  mirtazapine (REMERON) 15 MG tablet Take 15 mg by mouth at bedtime.   Yes [provider]  montelukast (SINGULAIR) 10 MG tablet Take 10 mg by mouth daily. 08/09/18  Yes [provider]  nitroGLYCERIN (NITROSTAT) 0.4 MG SL tablet Place 1 tablet (0.4 mg total) under the tongue every  5 (five) minutes as needed for chest pain. 11/08/15  Yes [place], Peter M, MD  Pancrelipase, Lip-Prot-Amyl, (ZENPEP) [phone] units CPEP Take 1 capsule by mouth 3 (three) times daily with meals.   Yes [provider]  pantoprazole (PROTONIX) 40 MG tablet Take 40 mg by mouth daily.   Yes [provider]  venlafaxine XR (EFFEXOR-XR) 150 MG 24 hr capsule Take 150 mg by mouth daily.   Yes [provider]  vitamin B-12 (CYANOCOBALAMIN) 500 MCG tablet Take 500 mcg by mouth daily.   Yes [provider]  Vitamin D, Ergocalciferol, (DRISDOL) 1.25 MG (50000 UT) CAPS capsule Take 50,000 Units by mouth every Wednesday.    Yes [provider]  ARIPiprazole (ABILIFY) 5 MG tablet Place 1 tablet (5 mg total) into feeding tube daily. Patient taking differently: Take 5 mg by mouth daily.  11/06/17   Florencia Reasons, MD  esomeprazole (NEXIUM) 40 MG capsule Take 40 mg by mouth daily. 07/29/18   [provider]  loperamide (IMODIUM) 2 MG capsule Take 2 mg by mouth daily as needed for diarrhea or loose stools.     [provider]    Physical Exam: Vitals:   10/16/19 0200 10/16/19 0300 10/16/19 0343 10/16/19 0400  BP: (!) 118/56 (!) 105/48 117/77 (!) 112/101  Pulse: (!) 123  (!) 111 (!) 122  Resp: 13 14 12 16   Temp:      TempSrc:      SpO2: 100%  100% 100%    Constitutional: NAD, calm, comfortable Vitals:   10/16/19 0200 10/16/19 0300 10/16/19 0343 10/16/19 0400  BP: (!) 118/56 (!) 105/48 117/77 (!) 112/101  Pulse: (!) 123  (!) 111 (!) 122  Resp: 13 14 12 16   Temp:      TempSrc:      SpO2: 100%  100% 100%   Eyes: PERRL, lids and conjunctivae normal ENMT: Mucous membranes are moist. Posterior pharynx clear of any exudate or lesions.Normal dentition.  Neck: normal, supple, no masses, no thyromegaly Respiratory: clear to auscultation bilaterally, no wheezing, no crackles. Normal respiratory effort. No accessory muscle use.  Cardiovascular: Regular  rate and rhythm, no murmurs / rubs / gallops. No extremity edema. 2+ pedal pulses. No carotid bruits.  Abdomen: no tenderness, no masses palpated.  No hepatosplenomegaly. Bowel sounds positive.  Musculoskeletal: no clubbing / cyanosis. No joint deformity upper and lower extremities. Good ROM, no contractures. Normal muscle tone.  Skin: no rashes, lesions, ulcers. No induration Neurologic: Patient is awake, alert and oriented to place and person but not to time.  Patient is able to follow all the directions appropriately.  CN 2-12 grossly intact. Sensation intact, DTR normal. Strength 5/5 in all 4.  Psychiatric: Patient is no more confused.  Normal judgment and insight. Alert and oriented x 3. Normal mood.   Labs on Admission: I have personally reviewed following labs and imaging studies  CBC: Recent Labs  Lab 10/15/19 2123 10/15/19 2130 10/16/19 0158  WBC 16.2*  --  11.3*  NEUTROABS 14.0*  --   --   HGB 11.9* 12.6 10.3*  HCT 35.1* 37.0 30.1*  MCV 84.2  --  83.6  PLT 284  --  932   Basic Metabolic Panel: Recent Labs  Lab 10/15/19 2123 10/15/19 2130 10/16/19 0158  NA 117* 114* 122*  K 6.1* 6.2* 4.7  CL 85* 90* 89*  CO2 10*  --  12*  GLUCOSE 98 97 116*  BUN 92* 106* 92*  CREATININE 6.18* 6.80* 5.06*  CALCIUM 9.2  --  8.5*  PHOS  --   --  7.9*   GFR: CrCl cannot be calculated (Unknown ideal weight.). Liver Function Tests: Recent Labs  Lab 10/15/19 2123 10/16/19 0158  AST 38  --   ALT 27  --   ALKPHOS 119  --   BILITOT 0.6  --   PROT 7.5  --   ALBUMIN 3.2* 2.8*   No results for input(s): LIPASE, AMYLASE in the last 168 hours. No results for input(s): AMMONIA in the last 168 hours. Coagulation Profile: No results for input(s): INR, PROTIME in the last 168 hours. Cardiac Enzymes: No results for input(s): CKTOTAL, CKMB, CKMBINDEX, TROPONINI in the last 168 hours. BNP (last 3 results) No results for input(s): PROBNP in the last 8760 hours. HbA1C: No results for  input(s): HGBA1C in the last 72 hours. CBG: No results for input(s): GLUCAP in the last 168 hours. Lipid Profile: Recent Labs    10/15/19 2123  TRIG 215*   Thyroid Function Tests: No results for input(s): TSH, T4TOTAL, FREET4, T3FREE, THYROIDAB in the last 72 hours. Anemia Panel: Recent Labs    10/15/19 2123  FERRITIN 123   Urine analysis:    Component Value Date/Time   COLORURINE YELLOW 10/16/2019 0131   APPEARANCEUR HAZY (A) 10/16/2019 0131   LABSPEC 1.010 10/16/2019 0131   PHURINE 5.0 10/16/2019 0131   GLUCOSEU NEGATIVE 10/16/2019 0131   HGBUR MODERATE (A) 10/16/2019 0131   BILIRUBINUR NEGATIVE 10/16/2019 0131   KETONESUR NEGATIVE 10/16/2019 0131   PROTEINUR NEGATIVE 10/16/2019 0131   NITRITE POSITIVE (A) 10/16/2019 0131   LEUKOCYTESUR LARGE (A) 10/16/2019 0131    Radiological Exams on Admission: CT Head Wo Contrast  Result Date: 10/15/2019 CLINICAL DATA:  Weakness, altered mental status EXAM: CT HEAD WITHOUT CONTRAST TECHNIQUE: Contiguous axial images were obtained from the base of the skull through the vertex without intravenous contrast. COMPARISON:  CT brain 06/20/2019 FINDINGS: Brain: No acute territorial infarction, hemorrhage or intracranial mass. Mild atrophy. Chronic infarct within the left parietal white matter. Mild small vessel ischemic changes of the white matter. Stable ventricle size Vascular: No hyperdense vessels. Vertebral and carotid vascular calcification Skull: Normal. Negative for fracture or focal lesion. Sinuses/Orbits: No acute finding. Other: None IMPRESSION: 1. No CT  evidence for acute intracranial abnormality. 2. Atrophy and minimal small vessel ischemic change of the white matter. Chronic infarct within the left parietal white matter. Electronically Signed   By: Donavan Foil M.D.   On: 10/15/2019 20:32   DG Chest Port 1 View  Result Date: 10/15/2019 CLINICAL DATA:  Altered mental status. Ex-smoker, hypertension EXAM: PORTABLE CHEST 1 VIEW  COMPARISON:  Chest radiograph 08/24/2019 FINDINGS: Stable cardiomediastinal contours with normal heart size. The lungs are clear. No pneumothorax or large pleural effusion. No acute finding in the visualized skeleton. IMPRESSION: No acute cardiopulmonary finding. Electronically Signed   By: Audie Pinto M.D.   On: 10/15/2019 20:02   CT Renal Stone Study  Result Date: 10/15/2019 CLINICAL DATA:  Flank pain history of colostomy EXAM: CT ABDOMEN AND PELVIS WITHOUT CONTRAST TECHNIQUE: Multidetector CT imaging of the abdomen and pelvis was performed following the standard protocol without IV contrast. COMPARISON:  CT 06/20/2019, 11/21/2017 FINDINGS: Lower chest: Lung bases demonstrate scattered foci of ill-defined ground-glass density in the right middle lobe and bilateral lung bases. No pleural effusion. Heart size within normal limits. Coronary vascular calcification. Bilateral breast prostheses. Possible small amount of fluid around the left breast implant. Hepatobiliary: No focal liver abnormality is seen. No gallstones, gallbladder wall thickening, or biliary dilatation. Pancreas: Atrophic.  No inflammatory change or ductal dilatation Spleen: Normal in size without focal abnormality. Adrenals/Urinary Tract: Adrenal glands are within normal limits. Kidneys show no hydronephrosis. Possible small cystocele. Bladder otherwise normal Stomach/Bowel: Status post right hemicolectomy and right lower quadrant ileostomy. Negative for bowel wall thickening Vascular/Lymphatic: Extensive aortic atherosclerosis. No aneurysm. No suspicious adenopathy Reproductive: Status post hysterectomy. No adnexal masses. Other: Negative for free air or free fluid. Small upper abdominal ventral hernia containing fat. Moderate left paramedian ventral hernia containing fat. Slightly caudal to this is a moderate to large left paramedian ventral hernia containing fat and surgically blind ending transverse colon. Similar infiltration of the  left abdominal subcutaneous fat. Small left abdominal fat containing density, series 3, image number 43, probable small area of fat necrosis, no change. Musculoskeletal: Degenerative changes. No acute or suspicious abnormality. IMPRESSION: 1. Multiple small foci of ill-defined ground-glass density at the right middle lobe and bilateral lung bases suspicious for multifocal infection, to include atypical or viral pneumonia. 2. Previous right hemicolectomy and right lower quadrant ileostomy without evidence for obstruction or bowel wall thickening. 3. Multiple ventral hernias as described above. Largest most caudal hernia contains fat and a portion of blind ending colon. Electronically Signed   By: Donavan Foil M.D.   On: 10/15/2019 20:46      Assessment/Plan Principal Problem:   Acute metabolic encephalopathy Acute metabolic encephalopathy present on admission is resolved and patient is able to answer all the questions appropriately. CT head was negative for acute intracranial pathology. Acute metabolic encephalopathy was most likely secondary to dehydration, pneumonia, uremia and electrolyte abnormalities. Continue bicarb drip and BMP every 4 hours.  Active Problems:   Acute kidney injury superimposed on CKD (HCC) Continue bicarb drip. Nephrology on board. Continue to monitor BMP    Hyperkalemia Patient was given Lokelma and continue bicarb drip. BMP every 4 hours.    Bilateral pneumonia Continue IV ceftriaxone and azithromycin.    DVT prophylaxis: Heparin Code Status: Full code Family Communication: Raquel Sarna is aware of patient's condition.  Consults called: Nephrology Admission status: Inpatient/MedSurg   Edmonia Lynch MD [facility] Pager   If 7PM-7AM, please contact night-coverage [email or web address] Password   10/16/2019, 4:28  AM

## 2019-10-17 LAB — COMPREHENSIVE METABOLIC PANEL
ALT: 20 U/L (ref 0–44)
AST: 30 U/L (ref 15–41)
Albumin: 2.5 g/dL — ABNORMAL LOW (ref 3.5–5.0)
Alkaline Phosphatase: 83 U/L (ref 38–126)
Anion gap: 14 (ref 5–15)
BUN: 65 mg/dL — ABNORMAL HIGH (ref 8–23)
CO2: 22 mmol/L (ref 22–32)
Calcium: 8.1 mg/dL — ABNORMAL LOW (ref 8.9–10.3)
Chloride: 95 mmol/L — ABNORMAL LOW (ref 98–111)
Creatinine, Ser: 1.97 mg/dL — ABNORMAL HIGH (ref 0.44–1.00)
GFR calc Af Amer: 29 mL/min — ABNORMAL LOW (ref >60)
GFR calc non Af Amer: 25 mL/min — ABNORMAL LOW (ref >60)
Glucose, Bld: 143 mg/dL — ABNORMAL HIGH (ref 70–99)
Potassium: 4 mmol/L (ref 3.5–5.1)
Sodium: 131 mmol/L — ABNORMAL LOW (ref 135–145)
Total Bilirubin: 0.6 mg/dL (ref 0.3–1.2)
Total Protein: 5.3 g/dL — ABNORMAL LOW (ref 6.5–8.1)

## 2019-10-17 LAB — CBC WITH DIFFERENTIAL/PLATELET
Abs Immature Granulocytes: 0.06 10*3/uL (ref 0.00–0.07)
Basophils Absolute: 0 10*3/uL (ref 0.0–0.1)
Basophils Relative: 0 %
Eosinophils Absolute: 0 10*3/uL (ref 0.0–0.5)
Eosinophils Relative: 0 %
HCT: 24.5 % — ABNORMAL LOW (ref 36.0–46.0)
Hemoglobin: 8.6 g/dL — ABNORMAL LOW (ref 12.0–15.0)
Immature Granulocytes: 1 %
Lymphocytes Relative: 7 %
Lymphs Abs: 0.4 10*3/uL — ABNORMAL LOW (ref 0.7–4.0)
MCH: 29.3 pg (ref 26.0–34.0)
MCHC: 35.1 g/dL (ref 30.0–36.0)
MCV: 83.3 fL (ref 80.0–100.0)
Monocytes Absolute: 0.1 10*3/uL (ref 0.1–1.0)
Monocytes Relative: 2 %
Neutro Abs: 5.6 10*3/uL (ref 1.7–7.7)
Neutrophils Relative %: 90 %
Platelets: 230 10*3/uL (ref 150–400)
RBC: 2.94 MIL/uL — ABNORMAL LOW (ref 3.87–5.11)
RDW: 12.8 % (ref 11.5–15.5)
WBC: 6.1 10*3/uL (ref 4.0–10.5)
nRBC: 0 % (ref 0.0–0.2)

## 2019-10-17 LAB — C-REACTIVE PROTEIN: CRP: 9.7 mg/dL — ABNORMAL HIGH (ref <1.0)

## 2019-10-17 LAB — D-DIMER, QUANTITATIVE: D-Dimer, Quant: 2.05 ug/mL-FEU — ABNORMAL HIGH (ref 0.00–0.50)

## 2019-10-17 LAB — FERRITIN: Ferritin: 137 ng/mL (ref 11–307)

## 2019-10-17 MED ORDER — ORAL CARE MOUTH RINSE
15.0000 mL | Freq: Two times a day (BID) | OROMUCOSAL | Status: DC
  Administered 2019-10-17 – 2019-10-20 (×7): 15 mL via OROMUCOSAL

## 2019-10-17 MED ORDER — MELATONIN 5 MG PO TABS
5.0000 mg | ORAL_TABLET | Freq: Every evening | ORAL | Status: DC | PRN
  Administered 2019-10-18 – 2019-10-19 (×3): 5 mg via ORAL
  Filled 2019-10-17 (×3): qty 1

## 2019-10-17 NOTE — Progress Notes (Signed)
PROGRESS NOTE  FRANCELIA MCLAREN  DOB: [birth year]  PCP: Leanna Battles, MD GEX:[id number]  DOA: 10/15/2019 Admitted From: Home  LOS: 2 days   Chief Complaint  Patient presents with  . Altered Mental Status   Brief narrative: [name] is a 74 y.o. female with PMH of HTN, HLD, DM2, combined CHF, CVA, seizure, diverticulosis s/p high output ileostomy, blind loop syndrome, chronic pancreatitis, IBS, fatty liver, depression. Patient presented to the ED on 10/15/2019 with confusion. Per history, multiple family members recently tested positive for Covid.  In ED, vital signs stable. WBC 16,000, sodium level was low at 114, creatinine elevated to 6.8, potassium elevated to 6.2, serum bicarbonate level low at 10. Covid 19 PCR positive. Chest x-ray showed right middle lobe and bilateral basilar infiltrates. CT head did not show any acute intracranial abnormality. Patient was admitted to hospital medicine service for COVID-19 pneumonia, AKI, hyperkalemia, hyponatremia and metabolic acidosis.  Subjective: Patient was seen and examined this morning.  Elderly Caucasian female.  Alert, awake, seems very weak.  Slow and weak to respond.  Oriented to place and person.  Able to follow motor commands but I generalized weakness. Remains on oxygen by nasal cannula.  Has IV fluids running, has a Foley catheter on.  Assessment/Plan: COVID pneumonia Acute respiratory failure with hypoxia  -Presented with altered mental status -Chest imaging -chest x-ray showed right middle lobe and bilateral basilar infiltrates -Treatment: IV Decadron 6 mg daily for 10 days, IV Remdesivir for 5 days to complete on 1/6 -Supportive care: Vitamin C, Zinc, inhalers, Tylenol, Antitussives - benzonatate, Mucinex -Oxygen - SpO2: 100 % O2 Flow Rate (L/min): 2 L/min -Labs and biomarker trend as below.  WBC count, D-dimer, CRP all improving. -Patient however clinically remains weak.  Lab Results  Component Value Date   SARSCOV2NAA POSITIVE (A) 10/15/2019   [place] NEGATIVE 08/25/2019    Recent Labs  Lab 10/15/19 2123 10/16/19 0158 10/16/19 1243 10/17/19 0333  WBC 16.2* 11.3* 9.6 6.1   Recent Labs    10/15/19 2123 10/16/19 1243 10/17/19 0333  DDIMER 3.01* 3.08* 2.05*  FERRITIN 123 133 137  LDH 162  --   --   CRP 9.9* 10.0* 9.7*   Secondary bacterial pneumonia -With chest imaging findings as above. -Initial WBC count elevated to 16.2, initial procalcitonin level elevated to 0.46. -Patient is also on IV Rocephin and IV azithromycin which I will continue. -WBC count improved.  Continue to follow WBC and temperature trend.  AKI on CKD stage IV Metabolic acidosis Hyperkalemia Hyponatremia -At presentation, creatinine 6.2, potassium 6.1, sodium 117, bicarb 10. -Nephrology consultation appreciated.  Renal and electrolyte abnormalities were likely because of acute kidney injury secondary to dehydration. -With bicarbonate drip, calcium gluconate and Lokelma renal function and electrolyte levels are improving. -Today, creatinine at 1.97, sodium 131, potassium 4, bicarbonate 22. -Currently on normal saline 100 mils per hour. -Repeat BMP tomorrow. -Strict I's and O's, daily weights  Acute metabolic encephalopathy -likely due to uremia versus pneumonia -CT head was negative for acute intracranial pathology. -Patient still is slow to mentate. -Med list reviewed.  She is on multiple different schedule and as needed mood altering medications including Abilify, Remeron, Effexor, Lomotil.  For now, I stopped all of them.  If she starts getting agitated, will gradually resume those meds.  Impaired mobility/generalized weakness -We will obtain PT evaluation.  Mildly elevated troponin Likely in the setting of demand ischemia/CKD EKG with no acute ST changes Echo done on 04/30/2019 showed EF  of 45 to 87%, diastolic dysfunction, moderate hypokinesis of the left ventricular, basal to mid inferior  lateral wall Stress test done on 12/18/2018 was negative for any reversible ischemic changes  Mobility: ?  Strict bedrest.  Encourage ambulation.  PT eval ordered. Diet: ?  NPO.  Ordered for cardiac diet Fluid: Normal saline 100 mL /h DVT prophylaxis:  Heparin subcu Code Status:  Full code Family Communication:  Expected Discharge:  Continue inpatient management  Consultants:    Procedures:    Antimicrobials: Anti-infectives (From admission, onward)   Start     Dose/Rate Route Frequency Ordered Stop   10/17/19 1000  remdesivir 100 mg in sodium chloride 0.9 % 100 mL IVPB     100 mg 200 mL/hr over 30 Minutes Intravenous Daily 10/16/19 1235 10/21/19 0959   10/17/19 0200  azithromycin (ZITHROMAX) 500 mg in sodium chloride 0.9 % 250 mL IVPB     500 mg 250 mL/hr over 60 Minutes Intravenous Every 24 hours 10/16/19 0814     10/17/19 0100  cefTRIAXone (ROCEPHIN) 1 g in sodium chloride 0.9 % 100 mL IVPB     1 g 200 mL/hr over 30 Minutes Intravenous Every 24 hours 10/16/19 0814     10/16/19 1430  remdesivir 200 mg in sodium chloride 0.9% 250 mL IVPB     200 mg 580 mL/hr over 30 Minutes Intravenous Once 10/16/19 1235 10/16/19 1615   10/16/19 0000  cefTRIAXone (ROCEPHIN) 1 g in sodium chloride 0.9 % 100 mL IVPB  Status:  Discontinued     1 g 200 mL/hr over 30 Minutes Intravenous Daily at bedtime 10/15/19 2342 10/16/19 0814   10/16/19 0000  azithromycin (ZITHROMAX) 500 mg in sodium chloride 0.9 % 250 mL IVPB  Status:  Discontinued     500 mg 250 mL/hr over 60 Minutes Intravenous Daily at bedtime 10/15/19 2342 10/16/19 0814        Code Status: Full Code   Diet Order            Diet NPO time specified  Diet effective now              Infusions:  . sodium chloride 100 mL/hr at 10/17/19 1116  . azithromycin Stopped (10/17/19 0233)  . cefTRIAXone (ROCEPHIN)  IV Stopped (10/17/19 0200)  . remdesivir 100 mg in NS 100 mL Stopped (10/17/19 1015)    Scheduled Meds: . amLODipine   10 mg Oral Daily  . ARIPiprazole  5 mg Oral Daily  . vitamin C  500 mg Oral Daily  . aspirin EC  81 mg Oral Daily  . atorvastatin  20 mg Oral Daily  . Chlorhexidine Gluconate Cloth  6 each Topical Daily  . clopidogrel  75 mg Oral Daily  . dexamethasone (DECADRON) injection  6 mg Intravenous Q24H  . diphenoxylate-atropine  1 tablet Oral TID  . glycopyrrolate  1 mg Oral BID  . heparin  5,000 Units Subcutaneous Q8H  . Ipratropium-Albuterol  1 puff Inhalation Q6H  . levETIRAcetam  500 mg Oral BID  . lipase/protease/amylase  36,000 Units Oral TID WC  . mouth rinse  15 mL Mouth Rinse BID  . metoprolol succinate  100 mg Oral Daily  . mirtazapine  15 mg Oral QHS  . montelukast  10 mg Oral Daily  . pantoprazole  40 mg Oral Daily  . venlafaxine XR  150 mg Oral Daily  . vitamin B-12  500 mcg Oral Daily  . zinc sulfate  220 mg Oral Daily  PRN meds: acetaminophen, cyclobenzaprine, guaiFENesin-dextromethorphan, hydrOXYzine, lip balm, metoprolol tartrate, nitroGLYCERIN   Objective: Vitals:   10/17/19 1000 10/17/19 1100  BP: (!) 174/57 (!) 157/53  Pulse: (!) 111 92  Resp: (!) 22 10  Temp:    SpO2: 100% 100%    Intake/Output Summary (Last 24 hours) at 10/17/2019 1155 Last data filed at 10/17/2019 1116 Gross per 24 hour  Intake 3146.3 ml  Output 1505 ml  Net 1641.3 ml   Filed Weights   10/16/19 1105 10/17/19 0143  Weight: 75.4 kg 75.5 kg   Weight change:  Body mass index is 29.48 kg/m.   Physical Exam: General exam: Appears weak and slow Skin: No rashes, lesions or ulcers. HEENT: Atraumatic, normocephalic, supple neck, no obvious bleeding Lungs: Clear to auscultation bilaterally CVS: Regular rate and rhythm, no murmur GI/Abd soft, nontender, nondistended, bowel sound present CNS: Alert, awake, slow to respond, weak Psychiatry: Depressed affect Extremities: No pedal edema, no calf tenderness  Data Review: I have personally reviewed the laboratory data and studies  available.  Recent Labs  Lab 10/15/19 2123 10/15/19 2130 10/16/19 0158 10/16/19 1243 10/17/19 0333  WBC 16.2*  --  11.3* 9.6 6.1  NEUTROABS 14.0*  --   --  8.0* 5.6  HGB 11.9* 12.6 10.3* 9.9* 8.6*  HCT 35.1* 37.0 30.1* 27.9* 24.5*  MCV 84.2  --  83.6 81.1 83.3  PLT 284  --  254 258 230   Recent Labs  Lab 10/15/19 2123 10/15/19 2130 10/16/19 0158 10/16/19 0353 10/16/19 1243 10/17/19 0333  NA 117* 114* 122* 123* 126* 131*  K 6.1* 6.2* 4.7 4.6 3.8 4.0  CL 85* 90* 89* 91* 86* 95*  CO2 10*  --  12* 14* 22 22  GLUCOSE 98 97 116* 141* 202* 143*  BUN 92* 106* 92* 90* 80* 65*  CREATININE 6.18* 6.80* 5.06* 4.65* 3.33* 1.97*  CALCIUM 9.2  --  8.5* 8.3* 8.4* 8.1*  PHOS  --   --  7.9* 7.2*  --   --     Terrilee Croak, MD  [facility] 10/17/2019

## 2019-10-17 NOTE — Progress Notes (Signed)
[facility] Progress Note  Subjective: no new c/o's  Vitals:   10/17/19 1200 10/17/19 1300 10/17/19 1400 10/17/19 1500  BP: (!) 130/50 (!) 103/51 (!) 137/57 114/78  Pulse: 95 79 88 92  Resp: '10 12 11 20  ' Temp:      TempSrc:      SpO2: 100% 98% 100% 98%  Weight:      Height:        Exam: Gen chron ill appearing No jvd or bruits Chest clear bilat no rales or wheezing RRR no MRG Abd soft ntnd  RLQ ostomy and healed scars GU foley w/ clear light yellow urine Ext no LE edema Neuro is alert, and responsive    Home meds:  - losartan 50 qd/ amlodipine 10/ meoprolol xl 100  - sl ntg prn/ clopidogrel 75/ atorvastatin 20/ aspirin 81  - levetiracetam 500 bid  - mirtazapine 15 hs/ venlafaxine xr 150 qd/ aripiprazole 5 qd  - pantoprazole 40/ montelukast 10  - prn's/ vitamins/ supplements   UA 1/2 > neg protein, many bact/ 20-50 wbc/ 0-5 rbc   CT abd 10/15/19 > bilat kidneys no hydro no atrophy   CXR +infiltrates RML and bilat bases    Date              Creat               eGFR   2008- 12        0.7- 1.0   Jan 2019       2.5 >> 0.8        AKI episode   2019              1.3- 1.21 Oct 2018       1.6- 1.9            26- 30, CKD IV   06/20/19            2.56        Nov 2020       3.6 >> 1.5        AKI episode   10/15/2019        6.1    10/16/19            4.65                 today   Assessment/ Plan: 1. AoCKD IV - pt admitted w/ confusion and COVID+. AKI due to dehydration+ ARB. Kidneys by abd CT look normal, no hydro. Creat 6.8 on admission has improved down to 1.97 today. Baseline creat 1.5- 1.9. No further suggestions, will sign off.  2. Hyponatremia - hypovolemic, resolved w/ NS 0.9% 3. COVID+ PNA - per primary, on nasal O2 low dose 4. HTN - resume prn, avoid acei/ ARB w/ AKI 5. H/o CVA - on asa/ plavix at home 6. H/o depression    Rob Corda Shutt 10/17/2019, 3:59 PM  Inpatient medications: . amLODipine  10 mg Oral Daily  . vitamin C  500 mg Oral Daily  .  aspirin EC  81 mg Oral Daily  . atorvastatin  20 mg Oral Daily  . Chlorhexidine Gluconate Cloth  6 each Topical Daily  . clopidogrel  75 mg Oral Daily  . dexamethasone (DECADRON) injection  6 mg Intravenous Q24H  . heparin  5,000 Units Subcutaneous Q8H  . Ipratropium-Albuterol  1 puff Inhalation Q6H  . levETIRAcetam  500 mg Oral BID  . lipase/protease/amylase  36,000 Units Oral TID  WC  . mouth rinse  15 mL Mouth Rinse BID  . metoprolol succinate  100 mg Oral Daily  . montelukast  10 mg Oral Daily  . pantoprazole  40 mg Oral Daily  . vitamin B-12  500 mcg Oral Daily  . zinc sulfate  220 mg Oral Daily   . sodium chloride 100 mL/hr at 10/17/19 1116  . azithromycin Stopped (10/17/19 0233)  . cefTRIAXone (ROCEPHIN)  IV Stopped (10/17/19 0200)  . remdesivir 100 mg in NS 100 mL Stopped (10/17/19 1015)   acetaminophen, lip balm, metoprolol tartrate, nitroGLYCERIN

## 2019-10-17 NOTE — Evaluation (Signed)
Physical Therapy Evaluation Patient Details Name: [name] MRN: [id number] DOB: 1946-01-30 Today's Date: 10/17/2019   History of Present Illness  Pt is a 75 y.o. female with PMH of HTN, HLD, DM2, combined CHF, CVA, seizure, diverticulosis s/p high output ileostomy, blind loop syndrome, chronic pancreatitis, IBS, fatty liver, depression. Patient presented to the ED on 10/15/2019 with confusion. Patient was admitted to hospital medicine service for COVID-19 pneumonia, AKI, hyperkalemia, hyponatremia and metabolic acidosis.  Clinical Impression  Pt admitted with above diagnosis. Pt was able to ambulate 35' in room with mild instability.  She required min guard for transfers.  Pt was on 2 LPM O2 at rest with sats 100%, tried RA and sats 98% and decreased to 93% with activity.  Pt tolerated therapy well. Encouraged use of RW at home and initial supervision. Pt currently with functional limitations due to the deficits listed below (see PT Problem List). Pt will benefit from skilled PT to increase their independence and safety with mobility to allow discharge to the venue listed below.       Follow Up Recommendations Home health PT;Supervision - Intermittent;Supervision for mobility/OOB    Equipment Recommendations       Recommendations for Other Services       Precautions / Restrictions Precautions Precautions: Fall      Mobility  Bed Mobility Overal bed mobility: Needs Assistance             General bed mobility comments: in chair at arrival  Transfers Overall transfer level: Needs assistance Equipment used: None Transfers: Sit to/from Stand Sit to Stand: Min guard         General transfer comment: increased time  Ambulation/Gait Ambulation/Gait assistance: Min guard Gait Distance (Feet): 35 Feet Assistive device: IV Pole Gait Pattern/deviations: Decreased stride length Gait velocity: decreased   General Gait Details: mild unsteadiness, no overt LOB, limited by  lines/leads and small room  Stairs            Wheelchair Mobility    Modified Rankin (Stroke Patients Only)       Balance Overall balance assessment: Needs assistance Sitting-balance support: Bilateral upper extremity supported;Feet supported Sitting balance-Leahy Scale: Good     Standing balance support: Single extremity supported;During functional activity Standing balance-Leahy Scale: Fair                               Pertinent Vitals/Pain Pain Assessment: No/denies pain    Home Living Family/patient expects to be discharged to:: Private residence Living Arrangements: Alone Available Help at Discharge: Family;Available PRN/intermittently Type of Home: House Home Access: Level entry     Home Layout: One level Home Equipment: Grab bars - tub/shower;Grab bars - toilet;Walker - 2 wheels;Shower seat      Prior Function Level of Independence: Independent         Comments: Reports driving, grocery shopping, and IADLs.  Denies falls.     Hand Dominance        Extremity/Trunk Assessment   Upper Extremity Assessment Upper Extremity Assessment: Overall WFL for tasks assessed(ROM WFL ; MMT 5/5)    Lower Extremity Assessment Lower Extremity Assessment: Overall WFL for tasks assessed(ROM WFL ; MMT 5/5)    Cervical / Trunk Assessment Cervical / Trunk Assessment: Normal  Communication   Communication: No difficulties  Cognition Arousal/Alertness: Awake/alert Behavior During Therapy: WFL for tasks assessed/performed Overall Cognitive Status: No family/caregiver present to determine baseline cognitive functioning Area of Impairment:  Orientation                 Orientation Level: Time;Disoriented to     Following Commands: Follows multi-step commands inconsistently              General Comments General comments (skin integrity, edema, etc.): On 2 LPM O2 at arrival with sats 100%; tried RA - sats 98% rest and 93% walking; placed  back on 2 LPM Post therapy    Exercises     Assessment/Plan    PT Assessment Patient needs continued PT services  PT Problem List Decreased strength;Decreased mobility;Decreased safety awareness;Decreased coordination;Decreased activity tolerance;Cardiopulmonary status limiting activity;Decreased balance;Decreased knowledge of use of DME       PT Treatment Interventions DME instruction;Therapeutic activities;Gait training;Therapeutic exercise;Patient/family education;Stair training;Functional mobility training;Balance training    PT Goals (Current goals can be found in the Care Plan section)  Acute Rehab PT Goals Patient Stated Goal: get some food; return home at d/c PT Goal Formulation: With patient Time For Goal Achievement: 10/31/19 Potential to Achieve Goals: Good    Frequency Min 3X/week   Barriers to discharge Decreased caregiver support      Co-evaluation               AM-PAC PT "6 Clicks" Mobility  Outcome Measure Help needed turning from your back to your side while in a flat bed without using bedrails?: A Little Help needed moving from lying on your back to sitting on the side of a flat bed without using bedrails?: A Little Help needed moving to and from a bed to a chair (including a wheelchair)?: None Help needed standing up from a chair using your arms (e.g., wheelchair or bedside chair)?: None Help needed to walk in hospital room?: None Help needed climbing 3-5 steps with a railing? : A Little 6 Click Score: 21    End of Session Equipment Utilized During Treatment: Gait belt Activity Tolerance: Patient tolerated treatment well Patient left: in chair;with call bell/phone within reach Nurse Communication: Mobility status PT Visit Diagnosis: Other abnormalities of gait and mobility (R26.89);Muscle weakness (generalized) (M62.81)    Time: 1740-8144 PT Time Calculation (min) (ACUTE ONLY): 34 min   Charges:   PT Evaluation $PT Eval Moderate Complexity:  1 Mod          Maggie Font, PT Acute Rehab Services Pager [phone] [facility] [phone] [facility] [place] 10/17/2019, 4:17 PM

## 2019-10-18 LAB — COMPREHENSIVE METABOLIC PANEL
ALT: 21 U/L (ref 0–44)
AST: 25 U/L (ref 15–41)
Albumin: 2.4 g/dL — ABNORMAL LOW (ref 3.5–5.0)
Alkaline Phosphatase: 83 U/L (ref 38–126)
Anion gap: 11 (ref 5–15)
BUN: 62 mg/dL — ABNORMAL HIGH (ref 8–23)
CO2: 21 mmol/L — ABNORMAL LOW (ref 22–32)
Calcium: 8.1 mg/dL — ABNORMAL LOW (ref 8.9–10.3)
Chloride: 99 mmol/L (ref 98–111)
Creatinine, Ser: 1.74 mg/dL — ABNORMAL HIGH (ref 0.44–1.00)
GFR calc Af Amer: 33 mL/min — ABNORMAL LOW (ref >60)
GFR calc non Af Amer: 29 mL/min — ABNORMAL LOW (ref >60)
Glucose, Bld: 180 mg/dL — ABNORMAL HIGH (ref 70–99)
Potassium: 4.1 mmol/L (ref 3.5–5.1)
Sodium: 131 mmol/L — ABNORMAL LOW (ref 135–145)
Total Bilirubin: 0.6 mg/dL (ref 0.3–1.2)
Total Protein: 5.4 g/dL — ABNORMAL LOW (ref 6.5–8.1)

## 2019-10-18 LAB — CBC WITH DIFFERENTIAL/PLATELET
Abs Immature Granulocytes: 0.17 10*3/uL — ABNORMAL HIGH (ref 0.00–0.07)
Basophils Absolute: 0 10*3/uL (ref 0.0–0.1)
Basophils Relative: 0 %
Eosinophils Absolute: 0 10*3/uL (ref 0.0–0.5)
Eosinophils Relative: 0 %
HCT: 25.5 % — ABNORMAL LOW (ref 36.0–46.0)
Hemoglobin: 8.7 g/dL — ABNORMAL LOW (ref 12.0–15.0)
Immature Granulocytes: 2 %
Lymphocytes Relative: 9 %
Lymphs Abs: 0.7 10*3/uL (ref 0.7–4.0)
MCH: 29.3 pg (ref 26.0–34.0)
MCHC: 34.1 g/dL (ref 30.0–36.0)
MCV: 85.9 fL (ref 80.0–100.0)
Monocytes Absolute: 0.3 10*3/uL (ref 0.1–1.0)
Monocytes Relative: 3 %
Neutro Abs: 7.3 10*3/uL (ref 1.7–7.7)
Neutrophils Relative %: 86 %
Platelets: 249 10*3/uL (ref 150–400)
RBC: 2.97 MIL/uL — ABNORMAL LOW (ref 3.87–5.11)
RDW: 13.2 % (ref 11.5–15.5)
WBC: 8.5 10*3/uL (ref 4.0–10.5)
nRBC: 0 % (ref 0.0–0.2)

## 2019-10-18 LAB — D-DIMER, QUANTITATIVE: D-Dimer, Quant: 1.61 ug/mL-FEU — ABNORMAL HIGH (ref 0.00–0.50)

## 2019-10-18 LAB — URINE CULTURE: Culture: >=100000 — AB

## 2019-10-18 LAB — C-REACTIVE PROTEIN: CRP: 6.3 mg/dL — ABNORMAL HIGH (ref <1.0)

## 2019-10-18 LAB — FERRITIN: Ferritin: 136 ng/mL (ref 11–307)

## 2019-10-18 MED ORDER — INSULIN ASPART 100 UNIT/ML ~~LOC~~ SOLN
0.0000 [IU] | Freq: Three times a day (TID) | SUBCUTANEOUS | Status: DC
  Administered 2019-10-18: 1 [IU] via SUBCUTANEOUS
  Administered 2019-10-18: 2 [IU] via SUBCUTANEOUS
  Administered 2019-10-19 – 2019-10-20 (×2): 1 [IU] via SUBCUTANEOUS

## 2019-10-18 MED ORDER — DIPHENOXYLATE-ATROPINE 2.5-0.025 MG PO TABS: 1.0000 | ORAL_TABLET | Freq: Three times a day (TID) | ORAL | Status: DC | PRN

## 2019-10-18 MED ORDER — HYDRALAZINE HCL 20 MG/ML IJ SOLN
10.0000 mg | Freq: Four times a day (QID) | INTRAMUSCULAR | Status: DC | PRN
  Administered 2019-10-18 – 2019-10-20 (×3): 10 mg via INTRAVENOUS
  Filled 2019-10-18 (×3): qty 1

## 2019-10-18 MED ORDER — INSULIN ASPART 100 UNIT/ML ~~LOC~~ SOLN: 0.0000 [IU] | Freq: Every day | SUBCUTANEOUS | Status: DC

## 2019-10-18 NOTE — Consult Note (Signed)
[facility] Nurse ostomy consult note Stoma type/location: RUQ, end ileostomy Stomal assessment/size: 1 1/4" slightly oblong, os at center Peristomal assessment: denudation mainly noted from 2-6 oclock, has some difficult abdominal topography to deal with (skin fold along the distal portion of the pannus) Treatment options for stomal/peristomal skin: treated denuded skin with ostomy powder "crusting" with No Sting skin barrier; Skin barrier ring used around the stoma Output: liquid green yellow Ostomy pouching: 2pc. 2 3/4" high output pouching system used  Ostomy pouching Cut new skin barrier using template in the room; have all supplies ready prior to removing old pouch Remove old pouch Make sure skin is completely dry before applying new pouch Use suction to manage output when attempting to change pouch (will need another person to hold suction for you) Apply ostomy powder to irritated/raw skin, brush off excess, tap over with skin prep (no sting-Cavilon; blue and white pack) Stretch barrier ring around stoma to fit exactly around stoma Apply new high output pouch; connect to bedside drainage bag.  [facility] nurse will follow along for support with ostomy care.  Patient reports she was not having issues with leaking at home  [place], [place], [place]

## 2019-10-18 NOTE — Progress Notes (Signed)
PROGRESS NOTE  [name]  DOB: 02-20-1946  PCP: Leanna Battles, MD ZYS:[id number]  DOA: 10/15/2019 Admitted From: Home  LOS: 3 days   Chief Complaint  Patient presents with  . Altered Mental Status   Brief narrative: [name] is a 74 y.o. female with PMH of HTN, HLD, DM2, combined CHF, CVA, seizure, diverticulosis s/p high output ileostomy, blind loop syndrome, chronic pancreatitis, IBS, fatty liver, depression. Patient presented to the ED on 10/15/2019 with confusion. Per history, multiple family members recently tested positive for Covid.  In ED, vital signs stable. WBC 16,000, sodium level was low at 114, creatinine elevated to 6.8, potassium elevated to 6.2, serum bicarbonate level low at 10. Covid 19 PCR positive. Chest x-ray showed right middle lobe and bilateral basilar infiltrates. CT head did not show any acute intracranial abnormality. Patient was admitted to hospital medicine service for COVID-19 pneumonia, AKI, hyperkalemia, hyponatremia and metabolic acidosis.  Subjective: Patient was seen and examined this morning.  Elderly Caucasian female.   Much awake today than yesterday.  Able to have a simple conversation.  Able to follow commands.  In fact she was able to walk on the room with physical therapy yesterday.  On low-flow oxygen today.    Assessment/Plan: COVID pneumonia Acute respiratory failure with hypoxia  -Presented with altered mental status -Chest imaging -chest x-ray showed right middle lobe and bilateral basilar infiltrates -Treatment: IV Decadron 6 mg daily for 10 days, IV Remdesivir for 5 days to complete on 1/6 -Supportive care: Vitamin C, Zinc, inhalers, Tylenol, Antitussives - benzonatate, Mucinex -Oxygen - SpO2: 96 % O2 Flow Rate (L/min): 2 L/min -Labs and biomarker trend as below. WBC count, D-dimer, CRP all improving. -Patient is overall improving.  Lab Results  Component Value Date   SARSCOV2NAA POSITIVE (A) 10/15/2019   [place] NEGATIVE 08/25/2019    Recent Labs  Lab 10/15/19 2123 10/16/19 0158 10/16/19 1243 10/17/19 0333 10/18/19 0238  WBC 16.2* 11.3* 9.6 6.1 8.5   Recent Labs    10/15/19 2123 10/16/19 1243 10/17/19 0333 10/18/19 0238 10/18/19 0751  DDIMER 3.01* 3.08* 2.05* 1.61*  --   FERRITIN 123 133 137  --  136  LDH 162  --   --   --   --   CRP 9.9* 10.0* 9.7*  --  6.3*   Secondary bacterial pneumonia -With chest imaging findings as above. -Initial WBC count elevated to 16.2, initial procalcitonin level elevated to 0.46. -Patient is also on IV Rocephin and IV azithromycin which I will continue for total of 5 days -WBC count improved.  Continue to follow WBC and temperature trend.  Klebsiella pneumoniae UTI -On IV Rocephin.  AKI on CKD stage IV Metabolic acidosis Hyperkalemia Hyponatremia -At presentation, creatinine 6.2, potassium 6.1, sodium 117, bicarb 10. -Nephrology consultation appreciated.  Renal and electrolyte abnormalities were likely because of acute kidney injury secondary to dehydration. -With bicarbonate drip, calcium gluconate and Lokelma renal function and electrolyte levels are improving. -Gradually patient's creatinine, sodium, potassium and bicarbonate level have all improved -I would stop IV fluid today. Remove foley catheter for voiding trial. -Repeat BMP tomorrow. -Strict I's and O's, daily weights  Acute metabolic encephalopathy Depression -likely due to uremia versus pneumonia -CT head was negative for acute intracranial pathology. -On my evaluation yesterday, patient was weak and very slow to mentate. I stopped all her mood altering medications including Abilify, Remeron, Effexor, Lomotil.   -Patient's mental status is much clearer today.  If she starts getting agitated, will  gradually resume those meds.  Impaired mobility/generalized weakness -PT eval obtained.  Home health PT recommended.  Mildly elevated troponin Chronic combined systolic  and diastolic CHF Likely in the setting of demand ischemia/CKD EKG with no acute ST changes Echo done on 04/30/2019 showed EF of 45 to 24%, diastolic dysfunction, moderate hypokinesis of the left ventricular, basal to mid inferior lateral wall Stress test done on 12/18/2018 was negative for any reversible ischemic changes  Excessive output from colostomy bag -Patient is history of diverticulosis s/p high output ileostomy, blind loop syndrome,  -Also history of chronic pancreatitis and IBS -Patient apparently has excessive output from colostomy bag.  According to patient's son, she was started on Lomotil and glycopyrrolate about a month ago to thicken the content.  He states that patient has been more lethargic since then.  I stopped both of them yesterday.  At this time I would put her back on Lomotil as needed to avoid excessive output and dehydration.  Mobility: Home with PT recommended.   Diet: Continue cardiac diet Fluid: Stop IV fluid DVT prophylaxis:  Heparin subcu  Code Status:  Full code Family Communication: Updated patient's son Mr. [name]. Expected Discharge:  Continue inpatient management. Completes remdesivir IV infusion on 1/6.  Anticipate discharge to home on 1/6 with home health RN/PT.  Consultants:    Procedures:    Antimicrobials: Anti-infectives (From admission, onward)   Start     Dose/Rate Route Frequency Ordered Stop   10/17/19 1000  remdesivir 100 mg in sodium chloride 0.9 % 100 mL IVPB     100 mg 200 mL/hr over 30 Minutes Intravenous Daily 10/16/19 1235 10/21/19 0959   10/17/19 0200  azithromycin (ZITHROMAX) 500 mg in sodium chloride 0.9 % 250 mL IVPB     500 mg 250 mL/hr over 60 Minutes Intravenous Every 24 hours 10/16/19 0814 10/19/19 2359   10/17/19 0100  cefTRIAXone (ROCEPHIN) 1 g in sodium chloride 0.9 % 100 mL IVPB     1 g 200 mL/hr over 30 Minutes Intravenous Every 24 hours 10/16/19 0814 10/19/19 2359   10/16/19 1430  remdesivir 200 mg in  sodium chloride 0.9% 250 mL IVPB     200 mg 580 mL/hr over 30 Minutes Intravenous Once 10/16/19 1235 10/16/19 1615   10/16/19 0000  cefTRIAXone (ROCEPHIN) 1 g in sodium chloride 0.9 % 100 mL IVPB  Status:  Discontinued     1 g 200 mL/hr over 30 Minutes Intravenous Daily at bedtime 10/15/19 2342 10/16/19 0814   10/16/19 0000  azithromycin (ZITHROMAX) 500 mg in sodium chloride 0.9 % 250 mL IVPB  Status:  Discontinued     500 mg 250 mL/hr over 60 Minutes Intravenous Daily at bedtime 10/15/19 2342 10/16/19 0814        Code Status: Full Code   Diet Order            Diet Heart Room service appropriate? Yes; Fluid consistency: Thin  Diet effective now              Infusions:  . azithromycin Stopped (10/18/19 0307)  . cefTRIAXone (ROCEPHIN)  IV Stopped (10/18/19 0034)  . remdesivir 100 mg in NS 100 mL Stopped (10/18/19 1057)    Scheduled Meds: . amLODipine  10 mg Oral Daily  . vitamin C  500 mg Oral Daily  . aspirin EC  81 mg Oral Daily  . atorvastatin  20 mg Oral Daily  . Chlorhexidine Gluconate Cloth  6 each Topical Daily  . clopidogrel  75 mg Oral Daily  . dexamethasone (DECADRON) injection  6 mg Intravenous Q24H  . heparin  5,000 Units Subcutaneous Q8H  . insulin aspart  0-5 Units Subcutaneous QHS  . insulin aspart  0-9 Units Subcutaneous TID WC  . Ipratropium-Albuterol  1 puff Inhalation Q6H  . levETIRAcetam  500 mg Oral BID  . lipase/protease/amylase  36,000 Units Oral TID WC  . mouth rinse  15 mL Mouth Rinse BID  . metoprolol succinate  100 mg Oral Daily  . montelukast  10 mg Oral Daily  . pantoprazole  40 mg Oral Daily  . vitamin B-12  500 mcg Oral Daily  . zinc sulfate  220 mg Oral Daily    PRN meds: acetaminophen, diphenoxylate-atropine, hydrALAZINE, lip balm, Melatonin, metoprolol tartrate, nitroGLYCERIN   Objective: Vitals:   10/18/19 1130 10/18/19 1200  BP:  (!) 120/98  Pulse:  85  Resp:    Temp: (!) 97.5 F (36.4 C)   SpO2:  96%    Intake/Output  Summary (Last 24 hours) at 10/18/2019 1335 Last data filed at 10/18/2019 1142 Gross per 24 hour  Intake 1883.4 ml  Output 2900 ml  Net -1016.6 ml   Filed Weights   10/16/19 1105 10/17/19 0143 10/18/19 0210  Weight: 75.4 kg 75.5 kg 78.4 kg   Weight change: 3 kg Body mass index is 30.62 kg/m.   Physical Exam: General exam: Calm and comfortable.  Not in physical distress Skin: No rashes, lesions or ulcers. HEENT: Atraumatic, normocephalic, supple neck, no obvious bleeding Lungs: Clear to auscultation bilaterally CVS: Regular rate and rhythm, no murmur GI/Abd soft, nontender, nondistended, bowel sound present CNS: Alert, awake, oriented x3 Psychiatry: Normal mood Extremities: No pedal edema, no calf tenderness  Data Review: I have personally reviewed the laboratory data and studies available.  Recent Labs  Lab 10/15/19 2123 10/15/19 2130 10/16/19 0158 10/16/19 1243 10/17/19 0333 10/18/19 0238  WBC 16.2*  --  11.3* 9.6 6.1 8.5  NEUTROABS 14.0*  --   --  8.0* 5.6 7.3  HGB 11.9* 12.6 10.3* 9.9* 8.6* 8.7*  HCT 35.1* 37.0 30.1* 27.9* 24.5* 25.5*  MCV 84.2  --  83.6 81.1 83.3 85.9  PLT 284  --  254 258 230 249   Recent Labs  Lab 10/16/19 0158 10/16/19 0353 10/16/19 1243 10/17/19 0333 10/18/19 0238  NA 122* 123* 126* 131* 131*  K 4.7 4.6 3.8 4.0 4.1  CL 89* 91* 86* 95* 99  CO2 12* 14* 22 22 21*  GLUCOSE 116* 141* 202* 143* 180*  BUN 92* 90* 80* 65* 62*  CREATININE 5.06* 4.65* 3.33* 1.97* 1.74*  CALCIUM 8.5* 8.3* 8.4* 8.1* 8.1*  PHOS 7.9* 7.2*  --   --   --     Terrilee Croak, MD  [facility] 10/18/2019

## 2019-10-19 LAB — HEMOGLOBIN A1C
Hgb A1c MFr Bld: 6.3 % — ABNORMAL HIGH (ref 4.8–5.6)
Mean Plasma Glucose: 134 mg/dL

## 2019-10-19 LAB — GLUCOSE, CAPILLARY
Glucose-Capillary: 169 mg/dL — ABNORMAL HIGH (ref 70–99)
Glucose-Capillary: 144 mg/dL — ABNORMAL HIGH (ref 70–99)
Glucose-Capillary: 134 mg/dL — ABNORMAL HIGH (ref 70–99)
Glucose-Capillary: 112 mg/dL — ABNORMAL HIGH (ref 70–99)
Glucose-Capillary: 120 mg/dL — ABNORMAL HIGH (ref 70–99)
Glucose-Capillary: 133 mg/dL — ABNORMAL HIGH (ref 70–99)
Glucose-Capillary: 171 mg/dL — ABNORMAL HIGH (ref 70–99)

## 2019-10-19 LAB — CBC WITH DIFFERENTIAL/PLATELET
Abs Immature Granulocytes: 0.43 10*3/uL — ABNORMAL HIGH (ref 0.00–0.07)
Basophils Absolute: 0 10*3/uL (ref 0.0–0.1)
Basophils Relative: 0 %
Eosinophils Absolute: 0 10*3/uL (ref 0.0–0.5)
Eosinophils Relative: 0 %
HCT: 28.2 % — ABNORMAL LOW (ref 36.0–46.0)
Hemoglobin: 9.1 g/dL — ABNORMAL LOW (ref 12.0–15.0)
Immature Granulocytes: 3 %
Lymphocytes Relative: 8 %
Lymphs Abs: 1 10*3/uL (ref 0.7–4.0)
MCH: 28.3 pg (ref 26.0–34.0)
MCHC: 32.3 g/dL (ref 30.0–36.0)
MCV: 87.9 fL (ref 80.0–100.0)
Monocytes Absolute: 0.6 10*3/uL (ref 0.1–1.0)
Monocytes Relative: 5 %
Neutro Abs: 11 10*3/uL — ABNORMAL HIGH (ref 1.7–7.7)
Neutrophils Relative %: 84 %
Platelets: 337 10*3/uL (ref 150–400)
RBC: 3.21 MIL/uL — ABNORMAL LOW (ref 3.87–5.11)
RDW: 13.2 % (ref 11.5–15.5)
WBC: 13 10*3/uL — ABNORMAL HIGH (ref 4.0–10.5)
nRBC: 0.2 % (ref 0.0–0.2)

## 2019-10-19 LAB — COMPREHENSIVE METABOLIC PANEL
ALT: 20 U/L (ref 0–44)
AST: 22 U/L (ref 15–41)
Albumin: 2.7 g/dL — ABNORMAL LOW (ref 3.5–5.0)
Alkaline Phosphatase: 85 U/L (ref 38–126)
Anion gap: 11 (ref 5–15)
BUN: 51 mg/dL — ABNORMAL HIGH (ref 8–23)
CO2: 19 mmol/L — ABNORMAL LOW (ref 22–32)
Calcium: 8.3 mg/dL — ABNORMAL LOW (ref 8.9–10.3)
Chloride: 105 mmol/L (ref 98–111)
Creatinine, Ser: 1.52 mg/dL — ABNORMAL HIGH (ref 0.44–1.00)
GFR calc Af Amer: 39 mL/min — ABNORMAL LOW (ref >60)
GFR calc non Af Amer: 34 mL/min — ABNORMAL LOW (ref >60)
Glucose, Bld: 131 mg/dL — ABNORMAL HIGH (ref 70–99)
Potassium: 4 mmol/L (ref 3.5–5.1)
Sodium: 135 mmol/L (ref 135–145)
Total Bilirubin: 0.6 mg/dL (ref 0.3–1.2)
Total Protein: 5.6 g/dL — ABNORMAL LOW (ref 6.5–8.1)

## 2019-10-19 LAB — C-REACTIVE PROTEIN: CRP: 4.7 mg/dL — ABNORMAL HIGH (ref <1.0)

## 2019-10-19 LAB — FERRITIN: Ferritin: 122 ng/mL (ref 11–307)

## 2019-10-19 LAB — D-DIMER, QUANTITATIVE: D-Dimer, Quant: 1.78 ug/mL-FEU — ABNORMAL HIGH (ref 0.00–0.50)

## 2019-10-19 MED ORDER — DIPHENOXYLATE-ATROPINE 2.5-0.025 MG PO TABS
1.0000 | ORAL_TABLET | Freq: Three times a day (TID) | ORAL | Status: DC
  Administered 2019-10-19 – 2019-10-20 (×3): 1 via ORAL
  Filled 2019-10-19 (×3): qty 1

## 2019-10-19 MED ORDER — FUROSEMIDE 10 MG/ML IJ SOLN
INTRAMUSCULAR | Status: AC
  Filled 2019-10-19: qty 8

## 2019-10-19 NOTE — Consult Note (Signed)
WOC in to change to Eakin pouch to LWS to allow for peristomal skin to heal.  When I arrived the patient has just had painful pouch change. Currently in 1pc flat.  Pouch intact. Discussed options and LWS will not be an option at home.  She reports and the bedside nurse reports that she was planning to go home today.  She requested to stay an additional night to see how she is feeling tomorrow. She has irritated skin and the nurse who changed her pouch reported she was in agony with pouch change.  I am fearful to change again at this time bc of pain for the patient.  Bedside nurse to page [place] nurse if pouch begins to leak prior to [facility] leaving [facility].  They will not place new pouch.  If after [place] leaves the campus they will need to continue pouch changes.  I will check in with staff at 1300 to see if the pouch is doing ok.   [place], [place], [place]

## 2019-10-19 NOTE — Progress Notes (Addendum)
PROGRESS NOTE  [name]  DOB: 1945/11/01  PCP: Leanna Battles, MD NUU:[id number]  DOA: 10/15/2019 Admitted From: Home  LOS: 4 days   Chief Complaint  Patient presents with  . Altered Mental Status   Brief narrative: [name] is a 74 y.o. female with PMH of HTN, HLD, DM2, combined CHF, CVA, seizure, diverticulosis s/p high output ileostomy, blind loop syndrome, chronic pancreatitis, IBS, fatty liver, depression. Patient presented to the ED on 10/15/2019 with confusion. Per history, multiple family members recently tested positive for Covid.  In ED, vital signs stable. WBC 16,000, sodium level was low at 114, creatinine elevated to 6.8, potassium elevated to 6.2, serum bicarbonate level low at 10. Covid 19 PCR positive. Chest x-ray showed right middle lobe and bilateral basilar infiltrates. CT head did not show any acute intracranial abnormality. Patient was admitted to hospital medicine service for COVID-19 pneumonia, AKI, hyperkalemia, hyponatremia and metabolic acidosis.  Subjective: Patient was seen and examined this morning.  Elderly Caucasian female.   Remains awake this morning.  Complains of excessive alcohol from her ileostomy.   Feels dehydrated and weak today  Assessment/Plan: COVID pneumonia Acute respiratory failure with hypoxia  -Presented with altered mental status -Chest imaging -chest x-ray showed right middle lobe and bilateral basilar infiltrates -Treatment: IV Decadron 6 mg daily for 10 days, IV Remdesivir for 5 days to complete on 1/6 -Supportive care: Vitamin C, Zinc, inhalers, Tylenol, Antitussives - benzonatate, Mucinex -Oxygen - SpO2: 97 % O2 Flow Rate (L/min): 2 L/min -Labs and biomarker trend as below. WBC count, D-dimer, CRP all improving. -Patient is overall improving.  Lab Results  Component Value Date   SARSCOV2NAA POSITIVE (A) 10/15/2019   [place] NEGATIVE 08/25/2019    Recent Labs  Lab 10/16/19 0158 10/16/19 1243  10/17/19 0333 10/18/19 0238 10/19/19 0202  WBC 11.3* 9.6 6.1 8.5 13.0*   Recent Labs    10/17/19 0333 10/18/19 0238 10/18/19 0751 10/19/19 0202  DDIMER 2.05* 1.61*  --  1.78*  FERRITIN 137  --  136 122  CRP 9.7*  --  6.3* 4.7*   Secondary bacterial pneumonia -With chest imaging findings as above. -Initial WBC count elevated to 16.2, initial procalcitonin level elevated to 0.46. -Patient is also on IV Rocephin and IV azithromycin which I will continue for total of 5 days -WBC count improved.  Continue to follow WBC and temperature trend.  Klebsiella pneumoniae UTI -On IV Rocephin.  AKI on CKD stage IV Metabolic acidosis Hyperkalemia Hyponatremia -At presentation, creatinine 6.2, potassium 6.1, sodium 117, bicarb 10. -Nephrology consultation appreciated.  Renal and electrolyte abnormalities were likely because of acute kidney injury secondary to dehydration. -With bicarbonate drip, calcium gluconate and Lokelma renal function and electrolyte levels are improving. -Gradually patient's creatinine, sodium, potassium and bicarbonate level have all improved -I would stop IV fluid today. Remove foley catheter for voiding trial. -Repeat BMP tomorrow. -Strict I's and O's, daily weights  Acute metabolic encephalopathy Depression -likely due to uremia versus pneumonia -CT head was negative for acute intracranial pathology. -On my evaluation on 1/3, patient was weak and very slow to mentate. I stopped all hermood altering medications including Abilify, Remeron, Effexor, Lomotil.  -Patient's mental status cleared in subsequent 24 hrs improved. On my discussion with patient's son, he is also agreeable to the idea that patient is on multiple different medications and her mental status has recently been worse after she was initiated on glycopyrrolate and Lomotil. I stopped glycopyrrolate.  I resumed Lomotil at low-dose  because patient tends to have excessive output from ileostomy.  If she  starts getting agitated, her psych meds can be resumed gradually.  Impaired mobility/generalized weakness -PT eval obtained.  Home health PT recommended.  Mildly elevated troponin Likely in the setting of demand ischemia/CKD EKG with no acute ST changes Stress test done on 12/18/2018 was negative for any reversible ischemic changes.  Essential hypertension Blood pressure has been running on the higher side this morning. Home meds include losartan, amlodipine, metoprolol -Currently metoprolol and amlodipine. Resume losartan post discharge.  Chronic combined systolic and diastolic CHF Echo done on 04/30/2019 showed EF of 45 to 66%, diastolic dysfunction, moderate hypokinesis of the left ventricular, basal to mid inferior lateral wall.  History of seizure disorder -Continue Keppra  Excessive output from ileostomy bag -Patient is history of diverticulosis s/p high output ileostomy, blind loop syndrome,  -Also history of chronic pancreatitis and IBS -Patient apparently has excessive output from colostomy bag.  According to patient's son, she was started on Lomotil and glycopyrrolate about a month ago to thicken the content.  He states that patient has been more lethargic since then.  I stopped both of them yesterday.  At this time I would put her back on Lomotil as needed to avoid excessive output and dehydration.  Mobility: Home with PT recommended.   Diet: Continue cardiac diet Fluid: Stop IV fluid DVT prophylaxis:  Heparin subcu  Code Status:  Full code Family Communication: Updated patient's son Mr. Jase Himmelberger on 1/4 Expected Discharge:  Continue inpatient management. Completes remdesivir IV infusion on 1/6.  Anticipate discharge to home on 1/6 with home health RN/PT. patient feels weak today because of excessive output to the ileostomy bag.  Consultants:    Procedures:    Antimicrobials: Anti-infectives (From admission, onward)   Start     Dose/Rate Route Frequency  Ordered Stop   10/17/19 1000  remdesivir 100 mg in sodium chloride 0.9 % 100 mL IVPB     100 mg 200 mL/hr over 30 Minutes Intravenous Daily 10/16/19 1235 10/21/19 0959   10/17/19 0200  azithromycin (ZITHROMAX) 500 mg in sodium chloride 0.9 % 250 mL IVPB     500 mg 250 mL/hr over 60 Minutes Intravenous Every 24 hours 10/16/19 0814 10/19/19 2359   10/17/19 0100  cefTRIAXone (ROCEPHIN) 1 g in sodium chloride 0.9 % 100 mL IVPB     1 g 200 mL/hr over 30 Minutes Intravenous Every 24 hours 10/16/19 0814 10/19/19 2359   10/16/19 1430  remdesivir 200 mg in sodium chloride 0.9% 250 mL IVPB     200 mg 580 mL/hr over 30 Minutes Intravenous Once 10/16/19 1235 10/16/19 1615   10/16/19 0000  cefTRIAXone (ROCEPHIN) 1 g in sodium chloride 0.9 % 100 mL IVPB  Status:  Discontinued     1 g 200 mL/hr over 30 Minutes Intravenous Daily at bedtime 10/15/19 2342 10/16/19 0814   10/16/19 0000  azithromycin (ZITHROMAX) 500 mg in sodium chloride 0.9 % 250 mL IVPB  Status:  Discontinued     500 mg 250 mL/hr over 60 Minutes Intravenous Daily at bedtime 10/15/19 2342 10/16/19 0814        Code Status: Full Code   Diet Order            Diet Heart Room service appropriate? Yes; Fluid consistency: Thin  Diet effective now              Infusions:  . azithromycin Stopped (10/18/19 2249)  . cefTRIAXone (ROCEPHIN)  IV Stopped (10/19/19 6629)  . remdesivir 100 mg in NS 100 mL Stopped (10/19/19 0957)    Scheduled Meds: . amLODipine  10 mg Oral Daily  . vitamin C  500 mg Oral Daily  . aspirin EC  81 mg Oral Daily  . atorvastatin  20 mg Oral Daily  . Chlorhexidine Gluconate Cloth  6 each Topical Daily  . clopidogrel  75 mg Oral Daily  . dexamethasone (DECADRON) injection  6 mg Intravenous Q24H  . diphenoxylate-atropine  1 tablet Oral TID  . heparin  5,000 Units Subcutaneous Q8H  . insulin aspart  0-5 Units Subcutaneous QHS  . insulin aspart  0-9 Units Subcutaneous TID WC  . Ipratropium-Albuterol  1 puff  Inhalation Q6H  . levETIRAcetam  500 mg Oral BID  . lipase/protease/amylase  36,000 Units Oral TID WC  . mouth rinse  15 mL Mouth Rinse BID  . metoprolol succinate  100 mg Oral Daily  . montelukast  10 mg Oral Daily  . pantoprazole  40 mg Oral Daily  . vitamin B-12  500 mcg Oral Daily  . zinc sulfate  220 mg Oral Daily    PRN meds: acetaminophen, hydrALAZINE, lip balm, Melatonin, metoprolol tartrate, nitroGLYCERIN   Objective: Vitals:   10/19/19 1200 10/19/19 1300  BP: (!) 146/67 (!) 151/60  Pulse: 72 88  Resp: 14 (!) 9  Temp:    SpO2: 98% 97%    Intake/Output Summary (Last 24 hours) at 10/19/2019 1313 Last data filed at 10/19/2019 1107 Gross per 24 hour  Intake 1197.29 ml  Output 1850 ml  Net -652.71 ml   Filed Weights   10/16/19 1105 10/17/19 0143 10/18/19 0210  Weight: 75.4 kg 75.5 kg 78.4 kg   Weight change:  Body mass index is 30.62 kg/m.   Physical Exam: General exam: Calm and comfortable.  Not in physical distress.  Feels weak today. Skin: No rashes, lesions or ulcers. HEENT: Atraumatic, normocephalic, supple neck, no obvious bleeding Lungs: Clear to auscultation bilaterally CVS: Regular rate and rhythm, no murmur GI/Abd soft, nontender, nondistended, bowel sound present. Ileostomy bag in place. CNS: Alert, awake, oriented x3 Psychiatry: Normal mood Extremities: No pedal edema, no calf tenderness  Data Review: I have personally reviewed the laboratory data and studies available.  Recent Labs  Lab 10/15/19 2123 10/16/19 0158 10/16/19 1243 10/17/19 0333 10/18/19 0238 10/19/19 0202  WBC 16.2* 11.3* 9.6 6.1 8.5 13.0*  NEUTROABS 14.0*  --  8.0* 5.6 7.3 11.0*  HGB 11.9* 10.3* 9.9* 8.6* 8.7* 9.1*  HCT 35.1* 30.1* 27.9* 24.5* 25.5* 28.2*  MCV 84.2 83.6 81.1 83.3 85.9 87.9  PLT 284 254 258 230 249 337   Recent Labs  Lab 10/16/19 0158 10/16/19 0353 10/16/19 1243 10/17/19 0333 10/18/19 0238 10/19/19 0202  NA 122* 123* 126* 131* 131* 135  K 4.7 4.6  3.8 4.0 4.1 4.0  CL 89* 91* 86* 95* 99 105  CO2 12* 14* 22 22 21* 19*  GLUCOSE 116* 141* 202* 143* 180* 131*  BUN 92* 90* 80* 65* 62* 51*  CREATININE 5.06* 4.65* 3.33* 1.97* 1.74* 1.52*  CALCIUM 8.5* 8.3* 8.4* 8.1* 8.1* 8.3*  PHOS 7.9* 7.2*  --   --   --   --     Terrilee Croak, MD  [facility] 10/19/2019

## 2019-10-19 NOTE — Consult Note (Signed)
WOC camera in with El[place] to check patient status; bedside nurse reports that pouch has been changed multiple times over night. Skin continues to be denuded.  Plans to place Eakin pouch to suction to allow time for peristomal skin to heal. Supplies gathered to initiate change.   [place], [place], [place]

## 2019-10-19 NOTE — Discharge Summary (Addendum)
Physician Discharge Summary  [name] HYQ:[id number] DOB: 11/15/1945 DOA: 10/15/2019  PCP: Leanna Battles, MD  Admit date: 10/15/2019 Discharge date: 10/20/2019  Admitted From: Home Discharge disposition: Home health PT   Code Status: Full Code  Diet Recommendation: Cardiac diet   Recommendations for Outpatient Follow-Up:   1. Follow-up with PCP as an outpatient.  May need medication adjustment especially of psychiatric medicines.  Discharge Diagnosis:   Principal Problem:   Acute metabolic encephalopathy Active Problems:   Acute kidney injury superimposed on CKD (HCC)   Hyperkalemia   Bilateral pneumonia  History of Present Illness / Brief narrative:  [name] is a 74 y.o. female with PMH of HTN, HLD, DM2, combined CHF, CVA, seizure, diverticulosis s/p high output ileostomy, blind loop syndrome, chronic pancreatitis, IBS, fatty liver, depression. Patient presented to the ED on 10/15/2019 with confusion. Per history, multiple family members recently tested positive for Covid.  In ED, vital signs stable. WBC 16,000, sodium level was low at 114, creatinine elevated to 6.8, potassium elevated to 6.2, serum bicarbonate level low at 10. Covid 19 PCR positive. Chest x-ray showed right middle lobe and bilateral basilar infiltrates. CT head did not show any acute intracranial abnormality. Patient was admitted to hospital medicine service for COVID-19 pneumonia, AKI, hyperkalemia, hyponatremia and metabolic acidosis.  Hospital Course:  COVID pneumonia Acute respiratory failure with hypoxia  -Presented with altered mental status -Chest imaging -chest x-ray showed right middle lobe and bilateral basilar infiltrates -Treatment: Decadron 6 mg daily for 10 days, IV Remdesivir for 5 days completed on 1/6 -Supportive care: Vitamin C, Zinc, inhalers, Tylenol, Antitussives - benzonatate, Mucinex -Initially required supplemental O2. Currently maintaining O2 sat on room air.  -WBC  and inflammatory markers improving. Trend as below.  -Patient is overall improving. -Patient completed the course of IV remdesivir today.  She has 5 more days of oral dexamethasone to finish at home.  Lab Results  Component Value Date   SARSCOV2NAA POSITIVE (A) 10/15/2019   [place] NEGATIVE 08/25/2019    Recent Labs  Lab 10/16/19 1243 10/17/19 0333 10/18/19 0238 10/19/19 0202 10/20/19 0202  WBC 9.6 6.1 8.5 13.0* 10.3   Recent Labs    10/18/19 0238 10/18/19 0751 10/19/19 0202 10/20/19 0202  DDIMER 1.61*  --  1.78* 1.54*  FERRITIN  --  136 122 87  CRP  --  6.3* 4.7* 2.9*    Secondary bacterial pneumonia -With chest imaging findings as above. -Initial WBC count elevated to 16.2, initial procalcitonin level elevated to 0.46. -Patient completed the course of antibiotics with IV Rocephin and IV azithromycin. -WBC count improved.  Continue to follow WBC and temperature trend.  Klebsiella pneumoniae UTI -Completed 5-day course of IV Rocephin. AKI on CKD stage IV Metabolic acidosis Hyperkalemia Hyponatremia -At presentation, creatinine 6.2, potassium 6.1, sodium 117, bicarb 10. -Nephrology consultation appreciated.  Renal and electrolyte abnormalities were likely because of acute kidney injury secondary to dehydration. -With bicarbonate drip, calcium gluconate and Lokelma renal function and electrolyte levels improved. -Gradually patient's creatinine, sodium, potassium and bicarbonate level have all improved -Fluid was stopped.   Acute metabolic encephalopathy Depression -likely due to uremia versus pneumonia -CT head was negative for acute intracranial pathology. 1/3- -On my evaluation, patient was weak and very slow to mentate. I stopped all her mood altering medications including Abilify, Remeron, Effexor, Lomotil.   1/4 -Patient's mental status cleared in subsequent 24 hrs. On my discussion with patient's son, he is also agreeable to the idea that  patient is on  multiple different medications and her mental status had recently been worsening after she was initiated on glycopyrrolate and Lomotil.  I stopped glycopyrrolate.  1/5- I resumed Lomotil at low-dose because patient tends to have excessive output from ileostomy.  If she starts getting agitated, her psych meds can be resumed gradually.  I have explained this to the patient and son as well. 1/6-I resumed Effexor at discharge today.  Impaired mobility/generalized weakness -PT eval obtained.  Home health PT recommended.  Mildly elevated troponin Likely in the setting of demand ischemia/CKD EKG with no acute ST changes Stress test done on 12/18/2018 was negative for any reversible ischemic changes.  Essential hypertension Blood pressure has been running on the higher side this morning. Home meds include losartan, amlodipine, metoprolol -Currently metoprolol and amlodipine.  Resume losartan post discharge.  Chronic combined systolic and diastolic CHF Echo done on 04/30/2019 showed EF of 45 to 72%, diastolic dysfunction, moderate hypokinesis of the left ventricular, basal to mid inferior lateral wall  History of seizure disorder -Continue Keppra  Excessive output from colostomy bag -Patient is history of diverticulosis s/p high output ileostomy, blind loop syndrome,  -Also history of chronic pancreatitis and IBS -Patient apparently has excessive output from colostomy bag.  According to patient's son, she was started on Lomotil and glycopyrrolate about a month ago to thicken the content.  He states that patient has been more lethargic since then.  Initially stopped all the medicines.  Mental status has improved.  So I started the patient back on Lomotil on a as needed basis.  I will still avoid glycopyrrolate.  Today patient states that her bowel content has started to thicken.  Stable for discharge to home today with home health PT, RN.  Subjective:  Seen and examined this morning.  Pleasant  elderly Caucasian female sitting up in chair.  Not in distress.  Not on oxygen supplementation.  Does not feel weak as before.  Bowel contents thickening.  Feels ready to go home.  Discharge Exam:   Vitals:   10/20/19 0800 10/20/19 0900 10/20/19 1000 10/20/19 1100  BP: (!) 160/113 136/75 (!) 134/56 126/62  Pulse: 96 98 93 66  Resp: 14 12 10 10   Temp:    (!) 97.2 F (36.2 C)  TempSrc:    Axillary  SpO2: 99% 99% 98% 98%  Weight:      Height:        Body mass index is 31.63 kg/m.  General exam: Appears calm and comfortable.  Skin: No rashes, lesions or ulcers. HEENT: Atraumatic, normocephalic, supple neck, no obvious bleeding Lungs: Clear discussed bilaterally CVS: Regular rate and rhythm, no murmur GI/Abd soft, nontender, nondistended was not present, ileostomy bag with bilious content CNS: Alert,, oriented x3 Psychiatry: Mood appropriate Extremities: No pedal edema, no calf tenderness  Discharge Instructions:  Wound care: None Discharge Instructions    Increase activity slowly   Complete by: As directed      Follow-up Information    Leanna Battles, MD Follow up.   Specialty: Internal Medicine Contact information: [place] [address] [phone]        [place], Peter M, MD .   Specialty: Cardiology Contact information: [address] [place] [address] [phone]        [facility] Follow up.   Specialty: [place] Why: they will call to mAke appointment Contact information: [place] [address] [phone]  Allergies as of 10/20/2019      Reactions   Adhesive [tape] Other (See Comments)   "Took off my skin"   Codeine Other (See Comments)   Altered mental state    Iodinated Diagnostic Agents Rash      Medication List    STOP taking these medications   ARIPiprazole 5 MG tablet Commonly known as: Abilify   glycopyrrolate 1 MG tablet Commonly known as:  Robinul   hydrOXYzine 25 MG tablet Commonly known as: ATARAX/VISTARIL   mirtazapine 15 MG tablet Commonly known as: REMERON     TAKE these medications   acetaminophen 325 MG tablet Commonly known as: TYLENOL Take 650 mg by mouth every 6 (six) hours as needed for headache (pain).   amLODipine 10 MG tablet Commonly known as: NORVASC Place 1 tablet (10 mg total) into feeding tube daily. What changed: how to take this   aspirin EC 81 MG tablet Take 81 mg by mouth daily.   atorvastatin 20 MG tablet Commonly known as: LIPITOR Take 20 mg by mouth daily.   clopidogrel 75 MG tablet Commonly known as: PLAVIX Take 75 mg by mouth daily.   cyclobenzaprine 10 MG tablet Commonly known as: FLEXERIL Take 10 mg by mouth 3 (three) times daily as needed.   dexamethasone 6 MG tablet Commonly known as: Decadron Take 1 tablet (6 mg total) by mouth daily for 5 days.   diphenoxylate-atropine 2.5-0.025 MG tablet Commonly known as: LOMOTIL Take 1 tablet by mouth 3 (three) times daily.   esomeprazole 40 MG capsule Commonly known as: NEXIUM Take 40 mg by mouth daily.   levETIRAcetam 500 MG tablet Commonly known as: KEPPRA TAKE 1 TABLET BY MOUTH TWICE A DAY   loperamide 2 MG capsule Commonly known as: IMODIUM Take 2 mg by mouth daily as needed for diarrhea or loose stools.   losartan 100 MG tablet Commonly known as: COZAAR Take 50 mg by mouth daily.   metoprolol succinate 100 MG 24 hr tablet Commonly known as: TOPROL-XL Take 100 mg by mouth daily. Take with or immediately following a meal.   montelukast 10 MG tablet Commonly known as: SINGULAIR Take 10 mg by mouth daily.   nitroGLYCERIN 0.4 MG SL tablet Commonly known as: NITROSTAT Place 1 tablet (0.4 mg total) under the tongue every 5 (five) minutes as needed for chest pain.   pantoprazole 40 MG tablet Commonly known as: PROTONIX Take 40 mg by mouth daily.   venlafaxine XR 150 MG 24 hr capsule Commonly known as:  EFFEXOR-XR Take 150 mg by mouth daily.   vitamin B-12 500 MCG tablet Commonly known as: CYANOCOBALAMIN Take 500 mcg by mouth daily.   Vitamin D (Ergocalciferol) 1.25 MG (50000 UT) Caps capsule Commonly known as: DRISDOL Take 50,000 Units by mouth every Wednesday.   Zenpep 40000-126000 units Cpep Generic drug: Pancrelipase (Lip-Prot-Amyl) Take 1 capsule by mouth 3 (three) times daily with meals.       Time coordinating discharge: 35 minutes  The results of significant diagnostics from this hospitalization (including imaging, microbiology, ancillary and laboratory) are listed below for reference.    Procedures and Diagnostic Studies:   CT Head Wo Contrast  Result Date: 10/15/2019 CLINICAL DATA:  Weakness, altered mental status EXAM: CT HEAD WITHOUT CONTRAST TECHNIQUE: Contiguous axial images were obtained from the base of the skull through the vertex without intravenous contrast. COMPARISON:  CT brain 06/20/2019 FINDINGS: Brain: No acute territorial infarction, hemorrhage or intracranial mass. Mild atrophy. Chronic infarct within the left  parietal white matter. Mild small vessel ischemic changes of the white matter. Stable ventricle size Vascular: No hyperdense vessels. Vertebral and carotid vascular calcification Skull: Normal. Negative for fracture or focal lesion. Sinuses/Orbits: No acute finding. Other: None IMPRESSION: 1. No CT evidence for acute intracranial abnormality. 2. Atrophy and minimal small vessel ischemic change of the white matter. Chronic infarct within the left parietal white matter. Electronically Signed   By: Donavan Foil M.D.   On: 10/15/2019 20:32   DG Chest Port 1 View  Result Date: 10/16/2019 CLINICAL DATA:  Dyspnea. EXAM: PORTABLE CHEST 1 VIEW COMPARISON:  October 15, 2019. FINDINGS: The heart size and mediastinal contours are within normal limits. Both lungs are clear. No pneumothorax or pleural effusion is noted. The visualized skeletal structures are  unremarkable. IMPRESSION: No active disease. Aortic Atherosclerosis (ICD10-I70.0). Electronically Signed   By: Marijo Conception M.D.   On: 10/16/2019 12:24   DG Chest Port 1 View  Result Date: 10/15/2019 CLINICAL DATA:  Altered mental status. Ex-smoker, hypertension EXAM: PORTABLE CHEST 1 VIEW COMPARISON:  Chest radiograph 08/24/2019 FINDINGS: Stable cardiomediastinal contours with normal heart size. The lungs are clear. No pneumothorax or large pleural effusion. No acute finding in the visualized skeleton. IMPRESSION: No acute cardiopulmonary finding. Electronically Signed   By: Audie Pinto M.D.   On: 10/15/2019 20:02   CT Renal Stone Study  Result Date: 10/15/2019 CLINICAL DATA:  Flank pain history of colostomy EXAM: CT ABDOMEN AND PELVIS WITHOUT CONTRAST TECHNIQUE: Multidetector CT imaging of the abdomen and pelvis was performed following the standard protocol without IV contrast. COMPARISON:  CT 06/20/2019, 11/21/2017 FINDINGS: Lower chest: Lung bases demonstrate scattered foci of ill-defined ground-glass density in the right middle lobe and bilateral lung bases. No pleural effusion. Heart size within normal limits. Coronary vascular calcification. Bilateral breast prostheses. Possible small amount of fluid around the left breast implant. Hepatobiliary: No focal liver abnormality is seen. No gallstones, gallbladder wall thickening, or biliary dilatation. Pancreas: Atrophic.  No inflammatory change or ductal dilatation Spleen: Normal in size without focal abnormality. Adrenals/Urinary Tract: Adrenal glands are within normal limits. Kidneys show no hydronephrosis. Possible small cystocele. Bladder otherwise normal Stomach/Bowel: Status post right hemicolectomy and right lower quadrant ileostomy. Negative for bowel wall thickening Vascular/Lymphatic: Extensive aortic atherosclerosis. No aneurysm. No suspicious adenopathy Reproductive: Status post hysterectomy. No adnexal masses. Other: Negative for free  air or free fluid. Small upper abdominal ventral hernia containing fat. Moderate left paramedian ventral hernia containing fat. Slightly caudal to this is a moderate to large left paramedian ventral hernia containing fat and surgically blind ending transverse colon. Similar infiltration of the left abdominal subcutaneous fat. Small left abdominal fat containing density, series 3, image number 43, probable small area of fat necrosis, no change. Musculoskeletal: Degenerative changes. No acute or suspicious abnormality. IMPRESSION: 1. Multiple small foci of ill-defined ground-glass density at the right middle lobe and bilateral lung bases suspicious for multifocal infection, to include atypical or viral pneumonia. 2. Previous right hemicolectomy and right lower quadrant ileostomy without evidence for obstruction or bowel wall thickening. 3. Multiple ventral hernias as described above. Largest most caudal hernia contains fat and a portion of blind ending colon. Electronically Signed   By: Donavan Foil M.D.   On: 10/15/2019 20:46     Labs:   Basic Metabolic Panel: Recent Labs  Lab 10/16/19 0158 10/16/19 0353 10/16/19 1243 10/17/19 0333 10/18/19 0238 10/19/19 0202 10/20/19 0202  NA 122* 123* 126* 131* 131* 135 135  K  4.7 4.6 3.8 4.0 4.1 4.0 4.1  CL 89* 91* 86* 95* 99 105 105  CO2 12* 14* 22 22 21* 19* 20*  GLUCOSE 116* 141* 202* 143* 180* 131* 125*  BUN 92* 90* 80* 65* 62* 51* 47*  CREATININE 5.06* 4.65* 3.33* 1.97* 1.74* 1.52* 1.41*  CALCIUM 8.5* 8.3* 8.4* 8.1* 8.1* 8.3* 8.3*  PHOS 7.9* 7.2*  --   --   --   --   --    GFR Estimated Creatinine Clearance: 36.3 mL/min (A) (by C-G formula based on SCr of 1.41 mg/dL (H)). Liver Function Tests: Recent Labs  Lab 10/16/19 1243 10/17/19 0333 10/18/19 0238 10/19/19 0202 10/20/19 0202  AST 35 30 25 22 18   ALT 23 20 21 20 19   ALKPHOS 101 83 83 85 81  BILITOT 0.7 0.6 0.6 0.6 0.2*  PROT 6.4* 5.3* 5.4* 5.6* 5.6*  ALBUMIN 2.8* 2.5* 2.4* 2.7*  2.5*   No results for input(s): LIPASE, AMYLASE in the last 168 hours. No results for input(s): AMMONIA in the last 168 hours. Coagulation profile No results for input(s): INR, PROTIME in the last 168 hours.  CBC: Recent Labs  Lab 10/16/19 1243 10/17/19 0333 10/18/19 0238 10/19/19 0202 10/20/19 0202  WBC 9.6 6.1 8.5 13.0* 10.3  NEUTROABS 8.0* 5.6 7.3 11.0* 8.1*  HGB 9.9* 8.6* 8.7* 9.1* 9.2*  HCT 27.9* 24.5* 25.5* 28.2* 28.3*  MCV 81.1 83.3 85.9 87.9 89.0  PLT 258 230 249 337 289   Cardiac Enzymes: No results for input(s): CKTOTAL, CKMB, CKMBINDEX, TROPONINI in the last 168 hours. BNP: Invalid input(s): POCBNP CBG: Recent Labs  Lab 10/19/19 0809 10/19/19 1106 10/19/19 1610 10/19/19 2011 10/20/19 0737  GLUCAP 112* 120* 133* 171* 103*   D-Dimer Recent Labs    10/19/19 0202 10/20/19 0202  DDIMER 1.78* 1.54*   Hgb A1c Recent Labs    10/18/19 0238  HGBA1C 6.3*   Lipid Profile No results for input(s): CHOL, HDL, LDLCALC, TRIG, CHOLHDL, LDLDIRECT in the last 72 hours. Thyroid function studies No results for input(s): TSH, T4TOTAL, T3FREE, THYROIDAB in the last 72 hours.  Invalid input(s): FREET3 Anemia work up Recent Labs    10/19/19 0202 10/20/19 0202  FERRITIN 122 87   Microbiology Recent Results (from the past 240 hour(s))  Blood culture (routine x 2)     Status: None (Preliminary result)   Collection Time: 10/15/19  7:38 PM   Specimen: BLOOD RIGHT ARM  Result Value Ref Range Status   Specimen Description   Final    BLOOD RIGHT ARM Performed at [facility], [address] [place], [place] [address]    Special Requests   Final    NONE Performed at [facility], [place] [address], [place], [place] [address]    Culture   Final    NO GROWTH 3 DAYS Performed at [facility], [place] [address], [place], [place] [address]    Report Status PENDING  Incomplete  Blood culture (routine x 2)     Status: None (Preliminary  result)   Collection Time: 10/15/19  9:23 PM   Specimen: BLOOD  Result Value Ref Range Status   Specimen Description   Final    BLOOD LEFT ANTECUBITAL Performed at [facility] [address], [place], [place] [address]    Special Requests   Final    BOTTLES DRAWN AEROBIC AND ANAEROBIC Blood Culture adequate volume Performed at [facility] [address]  [place], [place] [address]    Culture   Final    NO GROWTH 4 DAYS Performed at [facility], [place] [address], [place], [place] [address]    Report Status PENDING  Incomplete  SARS CORONAVIRUS 2 (TAT 6-24 HRS) Nasopharyngeal Nasopharyngeal Swab     Status: Abnormal   Collection Time: 10/15/19 11:26 PM   Specimen: Nasopharyngeal Swab  Result Value Ref Range Status   SARS Coronavirus 2 POSITIVE (A) NEGATIVE Final    Comment: RESULT CALLED TO, READ BACK BY AND VERIFIED WITH: SARA FRANKLIN RN.@[email or web address]  ON 01.02.2021 BY TCALDWELL MT. (NOTE) SARS-CoV-2 target nucleic acids are DETECTED. The SARS-CoV-2 RNA is generally detectable in upper and lower respiratory specimens during the acute phase of infection. Positive results are indicative of the presence of SARS-CoV-2 RNA. Clinical correlation with patient history and other diagnostic information is  necessary to determine patient infection status. Positive results do not rule out bacterial infection or co-infection with other viruses.  The expected result is Negative. Fact Sheet for Patients: SugarRoll.be Fact Sheet for Healthcare Providers: https://www.woods-mathews.com/ This test is not yet approved or cleared by the [place] FDA and  has been authorized for detection and/or diagnosis of SARS-CoV-2 by FDA under an Emergency Use Authorization (EUA). This EUA will remain  in effect (meaning this test ca n be used) for the duration of the COVID-19 declaration under Section 564(b)(1) of the Act, 21  U.S.C. section 360bbb-3(b)(1), unless the authorization is terminated or revoked sooner. Performed at [facility], [place] [address] [place], [place] [address]   Urine culture     Status: Abnormal   Collection Time: 10/16/19  1:31 AM   Specimen: Urine, Random  Result Value Ref Range Status   Specimen Description   Final    URINE, RANDOM Performed at [place] [address] [place], [place] [address]    Special Requests   Final    NONE Performed at [facility], [place] [address] [place], [place] [address]    Culture >=100,000 COLONIES/mL KLEBSIELLA PNEUMONIAE (A)  Final   Report Status 10/18/2019 FINAL  Final   Organism ID, Bacteria KLEBSIELLA PNEUMONIAE (A)  Final      Susceptibility   Klebsiella pneumoniae - MIC*    AMPICILLIN RESISTANT Resistant     CEFAZOLIN <=4 SENSITIVE Sensitive     CEFTRIAXONE <=0.25 SENSITIVE Sensitive     CIPROFLOXACIN <=0.25 SENSITIVE Sensitive     GENTAMICIN <=1 SENSITIVE Sensitive     IMIPENEM <=0.25 SENSITIVE Sensitive     NITROFURANTOIN 128 RESISTANT Resistant     TRIMETH/SULFA <=20 SENSITIVE Sensitive     AMPICILLIN/SULBACTAM <=2 SENSITIVE Sensitive     PIP/TAZO <=4 SENSITIVE Sensitive     * >=100,000 COLONIES/mL KLEBSIELLA PNEUMONIAE  MRSA PCR Screening     Status: None   Collection Time: 10/16/19 11:08 AM   Specimen: Nasal Mucosa; Nasopharyngeal  Result Value Ref Range Status   MRSA by PCR NEGATIVE NEGATIVE Final    Comment:        The GeneXpert MRSA Assay (FDA approved for NASAL specimens only), is one component of a comprehensive MRSA colonization surveillance program. It is not intended to diagnose MRSA infection nor to guide or monitor treatment for MRSA infections. Performed at [facility], [place] [address], [place],  [address]     Please note: You were cared for by a hospitalist during your hospital stay. Once you are discharged, your primary care  physician  will handle any further medical issues. Please note that NO REFILLS for any discharge medications will be authorized once you are discharged, as it is imperative that you return to your primary care physician (or establish a relationship with a primary care physician if you do not have one) for your post hospital discharge needs so that they can reassess your need for medications and monitor your lab values.  Signed: Terrilee Croak  [facility] 10/20/2019, 12:35 PM

## 2019-10-19 NOTE — Progress Notes (Signed)
Physical Therapy Treatment Patient Details Name: [name] MRN: [id number] DOB: 1946/04/22 Today's Date: 10/19/2019    History of Present Illness Pt is a 74 y.o. female with PMH of HTN, HLD, DM2, combined CHF, CVA, seizure, diverticulosis s/p high output ileostomy, blind loop syndrome, chronic pancreatitis, IBS, fatty liver, depression. Patient presented to the ED on 10/15/2019 with confusion. Patient was admitted to hospital medicine service for COVID-19 pneumonia, AKI, hyperkalemia, hyponatremia and metabolic acidosis.    PT Comments    Pt assisted with ambulating around room using RW.  Pt steady with RW and distance only limited by leaking ileosomy.  Pt hopeful for d/c home tomorrow.  Follow Up Recommendations  Home health PT;Supervision - Intermittent     Equipment Recommendations  Rolling walker with 5" wheels    Recommendations for Other Services       Precautions / Restrictions Precautions Precautions: Fall Precaution Comments: ileostomy leaking    Mobility  Bed Mobility Overal bed mobility: Needs Assistance Bed Mobility: Supine to Sit;Sit to Supine     Supine to sit: Supervision Sit to supine: Supervision   General bed mobility comments: supervision for lines only  Transfers Overall transfer level: Needs assistance Equipment used: Rolling walker (2 wheeled) Transfers: Sit to/from Stand Sit to Stand: Min guard         General transfer comment: min/guard for safety, ileostomy leaking so held washcloth over site (RN reports poor seal due to red skin most of today)  Ambulation/Gait Ambulation/Gait assistance: Min guard Gait Distance (Feet): 18 Feet Assistive device: Rolling walker (2 wheeled) Gait Pattern/deviations: Step-through pattern;Decreased stride length     General Gait Details: steady with RW, distance limited by leaking ileostomy only, Spo2 remained in 90s on room air   Stairs             Wheelchair Mobility    Modified Rankin  (Stroke Patients Only)       Balance                                            Cognition Arousal/Alertness: Awake/alert Behavior During Therapy: WFL for tasks assessed/performed;Flat affect Overall Cognitive Status: Within Functional Limits for tasks assessed                                        Exercises      General Comments        Pertinent Vitals/Pain Pain Assessment: No/denies pain    Home Living                      Prior Function            PT Goals (current goals can now be found in the care plan section) Progress towards PT goals: Progressing toward goals    Frequency    Min 3X/week      PT Plan Current plan remains appropriate    Co-evaluation              AM-PAC PT "6 Clicks" Mobility   Outcome Measure  Help needed turning from your back to your side while in a flat bed without using bedrails?: A Little Help needed moving from lying on your back to sitting on the side of a flat bed without using bedrails?:  A Little Help needed moving to and from a bed to a chair (including a wheelchair)?: A Little Help needed standing up from a chair using your arms (e.g., wheelchair or bedside chair)?: A Little Help needed to walk in hospital room?: A Little Help needed climbing 3-5 steps with a railing? : A Little 6 Click Score: 18    End of Session Equipment Utilized During Treatment: Gait belt Activity Tolerance: Patient tolerated treatment well Patient left: with call bell/phone within reach;with bed alarm set Nurse Communication: Mobility status PT Visit Diagnosis: Other abnormalities of gait and mobility (R26.89);Muscle weakness (generalized) (M62.81)     Time: 9471-2527 PT Time Calculation (min) (ACUTE ONLY): 25 min  Charges:  $Gait Training: 8-22 mins          Arlyce Dice, DPT Acute Rehabilitation Services Office: [phone]  [place] Ram E 10/19/2019, 4:18 PM

## 2019-10-20 LAB — COMPREHENSIVE METABOLIC PANEL
ALT: 19 U/L (ref 0–44)
AST: 18 U/L (ref 15–41)
Albumin: 2.5 g/dL — ABNORMAL LOW (ref 3.5–5.0)
Alkaline Phosphatase: 81 U/L (ref 38–126)
Anion gap: 10 (ref 5–15)
BUN: 47 mg/dL — ABNORMAL HIGH (ref 8–23)
CO2: 20 mmol/L — ABNORMAL LOW (ref 22–32)
Calcium: 8.3 mg/dL — ABNORMAL LOW (ref 8.9–10.3)
Chloride: 105 mmol/L (ref 98–111)
Creatinine, Ser: 1.41 mg/dL — ABNORMAL HIGH (ref 0.44–1.00)
GFR calc Af Amer: 43 mL/min — ABNORMAL LOW (ref >60)
GFR calc non Af Amer: 37 mL/min — ABNORMAL LOW (ref >60)
Glucose, Bld: 125 mg/dL — ABNORMAL HIGH (ref 70–99)
Potassium: 4.1 mmol/L (ref 3.5–5.1)
Sodium: 135 mmol/L (ref 135–145)
Total Bilirubin: 0.2 mg/dL — ABNORMAL LOW (ref 0.3–1.2)
Total Protein: 5.6 g/dL — ABNORMAL LOW (ref 6.5–8.1)

## 2019-10-20 LAB — CULTURE, BLOOD (ROUTINE X 2)
Culture: NO GROWTH
Special Requests: ADEQUATE

## 2019-10-20 LAB — GLUCOSE, CAPILLARY
Glucose-Capillary: 103 mg/dL — ABNORMAL HIGH (ref 70–99)
Glucose-Capillary: 137 mg/dL — ABNORMAL HIGH (ref 70–99)

## 2019-10-20 LAB — CBC WITH DIFFERENTIAL/PLATELET
Abs Immature Granulocytes: 0.37 10*3/uL — ABNORMAL HIGH (ref 0.00–0.07)
Basophils Absolute: 0 10*3/uL (ref 0.0–0.1)
Basophils Relative: 0 %
Eosinophils Absolute: 0 10*3/uL (ref 0.0–0.5)
Eosinophils Relative: 0 %
HCT: 28.3 % — ABNORMAL LOW (ref 36.0–46.0)
Hemoglobin: 9.2 g/dL — ABNORMAL LOW (ref 12.0–15.0)
Immature Granulocytes: 4 %
Lymphocytes Relative: 11 %
Lymphs Abs: 1.2 10*3/uL (ref 0.7–4.0)
MCH: 28.9 pg (ref 26.0–34.0)
MCHC: 32.5 g/dL (ref 30.0–36.0)
MCV: 89 fL (ref 80.0–100.0)
Monocytes Absolute: 0.6 10*3/uL (ref 0.1–1.0)
Monocytes Relative: 6 %
Neutro Abs: 8.1 10*3/uL — ABNORMAL HIGH (ref 1.7–7.7)
Neutrophils Relative %: 79 %
Platelets: 289 10*3/uL (ref 150–400)
RBC: 3.18 MIL/uL — ABNORMAL LOW (ref 3.87–5.11)
RDW: 13.3 % (ref 11.5–15.5)
WBC: 10.3 10*3/uL (ref 4.0–10.5)
nRBC: 0.2 % (ref 0.0–0.2)

## 2019-10-20 LAB — FERRITIN: Ferritin: 87 ng/mL (ref 11–307)

## 2019-10-20 LAB — D-DIMER, QUANTITATIVE: D-Dimer, Quant: 1.54 ug/mL-FEU — ABNORMAL HIGH (ref 0.00–0.50)

## 2019-10-20 LAB — C-REACTIVE PROTEIN: CRP: 2.9 mg/dL — ABNORMAL HIGH (ref <1.0)

## 2019-10-20 MED ORDER — DEXAMETHASONE 6 MG PO TABS: 6.0000 mg | ORAL_TABLET | Freq: Every day | ORAL | 0 refills | Status: AC

## 2019-10-20 NOTE — TOC Initial Note (Signed)
Transition of Care [facility]) - Initial/Assessment Note    Patient Details  Name: [name] MRN: [id number] Date of Birth: [birth year]  Transition of [facility] CM/SW Contact:    Leeroy Cha, RN Phone Number: 10/20/2019, 2:17 PM  Clinical Narrative:                 [facility] [place]  For pt and rn  Expected Discharge Plan: [place] Services Barriers to Discharge: No Barriers Identified   Patient Goals and CMS Choice   CMS Medicare.gov Compare Post Acute Care list provided to:: Patient    Expected Discharge Plan and Services Expected Discharge Plan: [place]   Discharge Planning Services: CM Consult Post Acute Care Choice: [place] arrangements for the past 2 months: Single Family Home Expected Discharge Date: 10/20/19                         HH Arranged: PT, RN [place] Agency: [facility] Date HH Agency Contacted: 10/20/19 Time HH Agency Contacted: 1054 Representative spoke with at [place]: amy  Prior Living Arrangements/Services Living arrangements for the past 2 months: [place] with:: Spouse Patient language and need for interpreter reviewed:: No Do you feel safe going back to the place where you live?: Yes      Need for Family Participation in Patient Care: Yes (Comment) Care giver support system in place?: Yes (comment)   Criminal Activity/Legal Involvement Pertinent to Current Situation/Hospitalization: No - Comment as needed  Activities of Daily Living Home Assistive Devices/Equipment: None ADL Screening (condition at time of admission) Patient's cognitive ability adequate to safely complete daily activities?: Yes Is the patient deaf or have difficulty hearing?: No Does the patient have difficulty seeing, even when wearing glasses/contacts?: No Does the patient have difficulty concentrating, remembering, or making decisions?: No Patient able to express need for assistance with ADLs?:  Yes Does the patient have difficulty dressing or bathing?: No Independently performs ADLs?: Yes (appropriate for developmental age) Does the patient have difficulty walking or climbing stairs?: No Weakness of Legs: None Weakness of Arms/Hands: None  Permission Sought/Granted                  Emotional Assessment Appearance:: Appears stated age     Orientation: : Oriented to Self, Oriented to Place, Oriented to  Time, Oriented to Situation Alcohol / Substance Use: Not Applicable Psych Involvement: No (comment)  Admission diagnosis:  Hyperkalemia [E87.5] Hyponatremia [E87.1] AKI (acute kidney injury) ([place]) [B15.1] Acute metabolic encephalopathy [V61.60] Close exposure to COVID-19 virus [Z20.822] Patient Active Problem List   Diagnosis Date Noted  . Hyperkalemia 10/16/2019  . Bilateral pneumonia 10/16/2019  . Acute metabolic encephalopathy 73/71/0626  . Seizures ([place]) 01/07/2019  . Cerebrovascular accident (CVA) due to occlusion of left carotid artery ([place]) 01/07/2019  . CAD, multiple vessel 01/07/2019  . H/O ischemic bowel disease 01/07/2019  . Acute bronchitis 12/04/2018  . Oral phase dysphagia 12/04/2018  . CKD (chronic kidney disease) stage 3, GFR 30-59 ml/min 11/04/2018  . Seizure ([place]) 11/03/2018  . 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 Scar of forehead 01/06/2018  . Traumatic ulceration of tongue 12/04/2017  . Ileostomy in place [facility]) 10/23/2017  . Pressure injury of skin 10/19/2017  . 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  Ileostomy 10/18/2017 10/14/2017  . Ischemic colitis ([place]) 10/14/2017  . Acute respiratory failure with hypoxemia ([place]) 10/14/2017  . Acute  post-operative pain 10/14/2017  . Acute kidney injury superimposed on CKD ([place]) 10/14/2017  . Anemia 10/14/2017  . Nocturia more than twice per night 05/07/2017  . Diabetes due to underlying condition w oth circulatory comp ([place]) 05/07/2017  . Atherosclerotic PVD with intermittent  claudication ([place]) 05/07/2017  . Poor sleep hygiene 05/07/2017  . 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 Hilar lymphadenopathy   . Hemoptysis 02/19/2017  . Pulmonary nodules/lesions, multiple 02/19/2017  . Tobacco use 02/19/2017  . RUQ abdominal pain 01/13/2017  . Left-sided extracranial carotid artery occlusion 11/26/2016  . 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 Encounter for routine follow-up after surgery of the circulatory system 11/26/2016  . Chest pain with moderate risk of acute coronary syndrome 10/27/2015  . Acid indigestion 12/31/2013  . Pain in left hip 08/26/2013  . Screening for gout 05/13/2013  . Disorder of skin or subcutaneous tissue 12/22/2012  . Difficult or painful urination 09/02/2012  . Peripheral blood vessel disorder ([place]) 05/11/2012  . 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 Follow-up examination, following unspecified surgery 05/11/2012  . Occlusion and stenosis of carotid artery without mention of cerebral infarction 04/14/2012  . Fatigue 09/24/2011  . Gonalgia 07/04/2011  . Avitaminosis D 05/02/2011  . Chronic pancreatitis ([place]) 04/30/2011  . Vitamin B12 deficiency 04/30/2011  . 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 Fatty liver 04/30/2011  . Adiposity 04/30/2011  . Other and unspecified general psychiatric examination 04/26/2011  . Abdominal pain 04/09/2011  . Iron deficiency anemia, unspecified  04/09/2011  . Other general symptoms  04/09/2011  . DM (diabetes mellitus screen) 04/09/2011  . Asthma 04/09/2011  . Type 2 diabetes mellitus with diabetic peripheral angiopathy without gangrene ([place]) 01/24/2011  . Abnormal weight gain 11/30/2010  . Dyslipidemia   . Dizziness   . Right-sided extracranial carotid artery stenosis   . Normal nuclear stress test   . Feeling bilious 07/11/2010  . Underimmunization status 07/11/2010  . Pancreas (digestive gland) works poorly 05/30/2010  . B12 deficiency  03/19/2010  . DVT 03/16/2010  . BLIND LOOP SYNDROME 03/16/2010  . Diarrhea 03/16/2010  . Chronic depression 02/23/2010  . Cough 11/17/2009  . Plantar verruca 11/17/2009  . Arteriosclerosis of coronary artery 11/16/2009  . Claudication ([place]) 11/16/2009  . Compulsive tobacco user syndrome 11/16/2009  . HLD (hyperlipidemia) 11/16/2009  . Nontoxic single thyroid nodule 11/16/2009  . Obstructive apnea 11/16/2009  . Radial styloid tenosynovitis 11/16/2009  . LBP (low back pain) 11/16/2009  . LAXATIVE ABUSE 12/17/2007  . BP (high blood pressure) 12/17/2007  . Acid reflux 12/17/2007  . HIATAL HERNIA 12/17/2007  . IBS 12/17/2007  . MELANOSIS COLI 12/17/2007  . ARTHRITIS 12/17/2007  . HEPATOMEGALY 12/17/2007   PCP:  Leanna Battles, MD Pharmacy:   [id number] - , [place] [address] [place] [address] Phone: [phone] Fax: [phone]     Social Determinants of Health (SDOH) Interventions    Readmission Risk Interventions No flowsheet data found.

## 2019-10-20 NOTE — Progress Notes (Signed)
Discharge instructions given to patient, all questions answered at this time.  Pt. Belongings with patient.  Pt. VSS with no s/s of distress noted.  Colostomy bag changed for discharge home, extra supplies in the room sent with patient.  Pt. Son notified of discharge.

## 2019-10-20 NOTE — Consult Note (Signed)
[place] Nurse ostomy follow up Stoma type/location: RUQ, end ileostomy  Stomal assessment/size: 1 1/2" budded, pink stoma, os at center  Peristomal assessment: worsening of the denudation this morning.  Denudation extends medial and distally about 3 cm from the stoma. She has some scant bleeding for the affected skin  Treatment options for stomal/peristomal skin: crusted skin with ostomy powder and no sting barrier wipes  Output liquid green; appears lomotil has been dc due to confusion. She has a high volume stoma and I am concerned about the amount of output and her DC to home alone Ostomy pouching: Small [place]; 2" barrie ring to low wall suction at 30mmHG using red robinson catheter secured within the pouch  Education provided: explained rationale for use of suction to patient; need to allow peristomal skin to heal in order to have any change of successful pouching at home Enrolled patient in [employer] yes, previously.   Will monitor daily. Extra supplies in the room with updated ostomy care orders in the patient's chart.   [place], [place], [place]

## 2019-10-21 ENCOUNTER — Other Ambulatory Visit: Payer: Self-pay

## 2019-10-21 ENCOUNTER — Emergency Department (HOSPITAL_COMMUNITY)
Admission: EM | Admit: 2019-10-21 | Discharge: 2019-10-22 | Disposition: A | Discharge status: Home / Self Care | Payer: 59 | Attending: Emergency Medicine | Admitting: Emergency Medicine

## 2019-10-21 ENCOUNTER — Emergency Department (HOSPITAL_COMMUNITY): Payer: 59

## 2019-10-21 DIAGNOSIS — U071 COVID-19: Secondary | ICD-10-CM | POA: Diagnosis not present

## 2019-10-21 DIAGNOSIS — Z7982 Long term (current) use of aspirin: Secondary | ICD-10-CM | POA: Diagnosis not present

## 2019-10-21 DIAGNOSIS — Z87891 Personal history of nicotine dependence: Secondary | ICD-10-CM | POA: Insufficient documentation

## 2019-10-21 DIAGNOSIS — E1122 Type 2 diabetes mellitus with diabetic chronic kidney disease: Secondary | ICD-10-CM | POA: Insufficient documentation

## 2019-10-21 DIAGNOSIS — N183 Chronic kidney disease, stage 3 unspecified: Secondary | ICD-10-CM | POA: Insufficient documentation

## 2019-10-21 DIAGNOSIS — R0602 Shortness of breath: Secondary | ICD-10-CM | POA: Diagnosis present

## 2019-10-21 DIAGNOSIS — I129 Hypertensive chronic kidney disease with stage 1 through stage 4 chronic kidney disease, or unspecified chronic kidney disease: Secondary | ICD-10-CM | POA: Diagnosis not present

## 2019-10-21 DIAGNOSIS — I2510 Atherosclerotic heart disease of native coronary artery without angina pectoris: Secondary | ICD-10-CM | POA: Diagnosis not present

## 2019-10-21 DIAGNOSIS — Z79899 Other long term (current) drug therapy: Secondary | ICD-10-CM | POA: Diagnosis not present

## 2019-10-21 LAB — COMPREHENSIVE METABOLIC PANEL
ALT: 22 U/L (ref 0–44)
AST: 26 U/L (ref 15–41)
Albumin: 3.1 g/dL — ABNORMAL LOW (ref 3.5–5.0)
Alkaline Phosphatase: 88 U/L (ref 38–126)
Anion gap: 12 (ref 5–15)
BUN: 50 mg/dL — ABNORMAL HIGH (ref 8–23)
CO2: 19 mmol/L — ABNORMAL LOW (ref 22–32)
Calcium: 8.7 mg/dL — ABNORMAL LOW (ref 8.9–10.3)
Chloride: 104 mmol/L (ref 98–111)
Creatinine, Ser: 1.89 mg/dL — ABNORMAL HIGH (ref 0.44–1.00)
GFR calc Af Amer: 30 mL/min — ABNORMAL LOW (ref >60)
GFR calc non Af Amer: 26 mL/min — ABNORMAL LOW (ref >60)
Glucose, Bld: 88 mg/dL (ref 70–99)
Potassium: 3.8 mmol/L (ref 3.5–5.1)
Sodium: 135 mmol/L (ref 135–145)
Total Bilirubin: 0.5 mg/dL (ref 0.3–1.2)
Total Protein: 6.2 g/dL — ABNORMAL LOW (ref 6.5–8.1)

## 2019-10-21 LAB — CBC WITH DIFFERENTIAL/PLATELET
Abs Immature Granulocytes: 0.68 10*3/uL — ABNORMAL HIGH (ref 0.00–0.07)
Basophils Absolute: 0.1 10*3/uL (ref 0.0–0.1)
Basophils Relative: 0 %
Eosinophils Absolute: 0 10*3/uL (ref 0.0–0.5)
Eosinophils Relative: 0 %
HCT: 30.3 % — ABNORMAL LOW (ref 36.0–46.0)
Hemoglobin: 9.8 g/dL — ABNORMAL LOW (ref 12.0–15.0)
Immature Granulocytes: 3 %
Lymphocytes Relative: 18 %
Lymphs Abs: 3.6 10*3/uL (ref 0.7–4.0)
MCH: 28.6 pg (ref 26.0–34.0)
MCHC: 32.3 g/dL (ref 30.0–36.0)
MCV: 88.3 fL (ref 80.0–100.0)
Monocytes Absolute: 2.1 10*3/uL — ABNORMAL HIGH (ref 0.1–1.0)
Monocytes Relative: 11 %
Neutro Abs: 13.5 10*3/uL — ABNORMAL HIGH (ref 1.7–7.7)
Neutrophils Relative %: 68 %
Platelets: 328 10*3/uL (ref 150–400)
RBC: 3.43 MIL/uL — ABNORMAL LOW (ref 3.87–5.11)
RDW: 13.5 % (ref 11.5–15.5)
WBC: 19.9 10*3/uL — ABNORMAL HIGH (ref 4.0–10.5)
nRBC: 0.1 % (ref 0.0–0.2)

## 2019-10-21 LAB — CULTURE, BLOOD (ROUTINE X 2): Culture: NO GROWTH

## 2019-10-21 MED ORDER — SODIUM CHLORIDE 0.9 % IV BOLUS
1000.0000 mL | Freq: Once | INTRAVENOUS | Status: AC
  Administered 2019-10-21: 1000 mL via INTRAVENOUS

## 2019-10-21 NOTE — Discharge Instructions (Addendum)
Follow-up with your doctor.  It is normal for someone to not feel well for multiple weeks after they have been diagnosed with the novel coronavirus.  Please return for worsening or if there is something that concerns you otherwise please call your family doctor and follow-up with him in the office.  Please continue the steroids that were previously prescribed when you were discharged from the hospital.  Follow-up with home health agency as coordinated by our social worker today.

## 2019-10-21 NOTE — ED Triage Notes (Signed)
Patient recently d/c from [place] long but daughter called EMS today because patient was acting disoriented. No neuro symptoms noted by EMS. Patient states that "she just feels confused". Diagnosed COVID + 5 days ago per family.

## 2019-10-21 NOTE — ED Notes (Signed)
Spoke to son to come pick up pt since she is being discharged. Pt son stated she could not be discharge because she can not care for herself at home. He stated she can not remember what medications she needs to take and can not change her colostomy bag. Will notify MD

## 2019-10-21 NOTE — ED Notes (Signed)
Patient currently using bedside commode.

## 2019-10-21 NOTE — ED Provider Notes (Addendum)
[facility] DEPT Provider Note   CSN: [id number] Arrival date & time: 10/21/19  1825     History No chief complaint on file.   [name] is a 74 y.o. female.  74 yo F with a chief complaints of feeling unwell.  Patient states that she was just out of the hospital yesterday and she still does not feel well.  Was diagnosed with the novel coronavirus.  Feels that her breathing has gotten better but she just feels crummy.  Also feels a bit slower than she typically is.  She also has an area of her abdomen that has been slowly bleeding over the course of the day.  The history is provided by the patient.  Illness Severity:  Moderate Onset quality:  Gradual Duration:  1 week Timing:  Constant Progression:  Unchanged Chronicity:  New Associated symptoms: no chest pain, no congestion, no fever, no headaches, no myalgias, no nausea, no rhinorrhea, no shortness of breath, no vomiting and no wheezing        Past Medical History:  Diagnosis Date  . Anxiety   . Arthropathy, unspecified, site unspecified   . Blind loop syndrome   . Carotid artery disease ([place])    documented occlusion of left ICA  . Coronary artery disease    a. known 60-70% mid-LAD stenosis with caths in 1999, 2002, 2006, and 2012 showing stable anatomy b. low-risk NST in 10/2015  . Depression   . Diabetes insipidus ([place])   . Diabetes mellitus   . Diverticulosis   . Dizziness    chronic  . Dyslipidemia   . Dyspnea    with exertion  . Fatty liver   . GERD (gastroesophageal reflux disease)    hiatial hernia  . Hepatomegaly   . Hiatal hernia   . HTN (hypertension)   . Hyperlipidemia   . Hypertension   . Irritable bowel syndrome   . Melanosis coli   . Metatarsal fracture right  . Normal nuclear stress test    nml 01/03/10;06/19/07  . Other, mixed, or unspecified nondependent drug abuse, unspecified   . Pancreatitis, chronic ([place])   . Peripheral vascular disease ([place]) 02/20/10    s/p stent of left SFA  . Seizure ([place]) 11/04/2018  . Sleep apnea   . Stroke ([place])   . Thyroid nodule   . Unspecified essential hypertension   . Vitamin B12 deficiency     Patient Active Problem List   Diagnosis Date Noted  . Hyperkalemia 10/16/2019  . Bilateral pneumonia 10/16/2019  . Acute metabolic encephalopathy 37/07/6268  . Seizures ([place]) 01/07/2019  . Cerebrovascular accident (CVA) due to occlusion of left carotid artery ([place]) 01/07/2019  . CAD, multiple vessel 01/07/2019  . H/O ischemic bowel disease 01/07/2019  . Acute bronchitis 12/04/2018  . Oral phase dysphagia 12/04/2018  . CKD (chronic kidney disease) stage 3, GFR 30-59 ml/min 11/04/2018  . Seizure ([place]) 11/03/2018  . 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 Scar of forehead 01/06/2018  . Traumatic ulceration of tongue 12/04/2017  . Ileostomy in place [facility]) 10/23/2017  . Pressure injury of skin 10/19/2017  . 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  Ileostomy 10/18/2017 10/14/2017  . Ischemic colitis ([place]) 10/14/2017  . Acute respiratory failure with hypoxemia ([place]) 10/14/2017  . Acute post-operative pain 10/14/2017  . Acute kidney injury superimposed on CKD ([place]) 10/14/2017  . Anemia 10/14/2017  . Nocturia more than twice per night 05/07/2017  . Diabetes due to underlying condition w  oth circulatory comp ([place]) 05/07/2017  . Atherosclerotic PVD with intermittent claudication ([place]) 05/07/2017  . Poor sleep hygiene 05/07/2017  . 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 Hilar lymphadenopathy   . Hemoptysis 02/19/2017  . Pulmonary nodules/lesions, multiple 02/19/2017  . Tobacco use 02/19/2017  . RUQ abdominal pain 01/13/2017  . Left-sided extracranial carotid artery occlusion 11/26/2016  . PAOD (peripheral arterial occlusive disease) ([place]) 11/26/2016  . Encounter for routine follow-up after surgery of the circulatory system 11/26/2016  . Chest pain with moderate risk of acute coronary syndrome 10/27/2015   . Acid indigestion 12/31/2013  . Pain in left hip 08/26/2013  . Screening for gout 05/13/2013  . Disorder of skin or subcutaneous tissue 12/22/2012  . Difficult or painful urination 09/02/2012  . Peripheral blood vessel disorder ([place]) 05/11/2012  . 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 Follow-up examination, following unspecified surgery 05/11/2012  . Occlusion and stenosis of carotid artery without mention of cerebral infarction 04/14/2012  . Fatigue 09/24/2011  . Gonalgia 07/04/2011  . Avitaminosis D 05/02/2011  . Chronic pancreatitis ([place]) 04/30/2011  . Vitamin B12 deficiency 04/30/2011  . 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 Fatty liver 04/30/2011  . Adiposity 04/30/2011  . Other and unspecified general psychiatric examination 04/26/2011  . Abdominal pain 04/09/2011  . Iron deficiency anemia, unspecified  04/09/2011  . Other general symptoms  04/09/2011  . DM (diabetes mellitus screen) 04/09/2011  . Asthma 04/09/2011  . Type 2 diabetes mellitus with diabetic peripheral angiopathy without gangrene ([place]) 01/24/2011  . Abnormal weight gain 11/30/2010  . Dyslipidemia   . Dizziness   . Right-sided extracranial carotid artery stenosis   . Normal nuclear stress test   . Feeling bilious 07/11/2010  . Underimmunization status 07/11/2010  . Pancreas (digestive gland) works poorly 05/30/2010  . B12 deficiency 03/19/2010  . DVT 03/16/2010  . BLIND LOOP SYNDROME 03/16/2010  . Diarrhea 03/16/2010  . Chronic depression 02/23/2010  . Cough 11/17/2009  . Plantar verruca 11/17/2009  . Arteriosclerosis of coronary artery 11/16/2009  . Claudication ([place]) 11/16/2009  . Compulsive tobacco user syndrome 11/16/2009  . HLD (hyperlipidemia) 11/16/2009  . Nontoxic single thyroid nodule 11/16/2009  . Obstructive apnea 11/16/2009  . Radial styloid tenosynovitis 11/16/2009  . LBP (low back pain) 11/16/2009  . LAXATIVE ABUSE 12/17/2007  . BP (high blood pressure)  12/17/2007  . Acid reflux 12/17/2007  . HIATAL HERNIA 12/17/2007  . IBS 12/17/2007  . MELANOSIS COLI 12/17/2007  . ARTHRITIS 12/17/2007  . HEPATOMEGALY 12/17/2007    Past Surgical History:  Procedure Laterality Date  . ABDOMINAL HYSTERECTOMY     partial  . AUGMENTATION MAMMAPLASTY     bilateral 1976 retro   . BREAST ENHANCEMENT SURGERY    . CARDIAC CATHETERIZATION  07/07/98;08/31/01;03/04/05   60 -70% LAD  . CATARACT EXTRACTION    . COLON RESECTION N/A 10/14/2017   Procedure: EXPLORATORY LAPAROTOMY, EXTENDED RIGHT COLECTOMY; APPLICATION OF ABDOMINAL VACUUM DRESSING;  Surgeon: Jovita Kussmaul, MD;  Location: [facility] ORS;  Service: General;  Laterality: N/A;  . ENDOBRONCHIAL ULTRASOUND Bilateral 02/24/2017   Procedure: ENDOBRONCHIAL ULTRASOUND;  Surgeon: Collene Gobble, MD;  Location: [facility] ENDOS[facility];  Service: Cardiopulmonary;  Laterality: Bilateral;  . EYE SURGERY Left Nov. 2016   Cataract  . EYE SURGERY Right 2001   Cataract  . FEMORAL ARTERY STENT     Left leg  . LAPAROTOMY N/A 10/16/2017   Procedure: EXPLORATORY LAPAROTOMY, PARTIAL OMENTECTOMY, RESECTION ISCHEMIC ILEUM 950DT, APPLICATION OF VAC ABDOMINAL DRESSING;  Surgeon: Harlow Asa,  Sherren Mocha, MD;  Location: [facility] ORS;  Service: General;  Laterality: N/A;  . LAPAROTOMY N/A 10/18/2017   Procedure: RE-EXPLORATION OF ABDOMEN, ILEOSTOMY CREATION;  Surgeon: Stark Klein, MD;  Location: [facility];  Service: General;  Laterality: N/A;  . LUNG SURGERY  03/2017   Benign polyps removed  . PATELLA REALIGNMENT Left   . TONSILLECTOMY    . TOTAL ABDOMINAL HYSTERECTOMY    . WRIST SURGERY     right     OB History   No obstetric history on file.     Family History  Problem Relation Age of Onset  . Hypertension Mother   . Osteoarthritis Mother   . Pulmonary embolism Mother   . Hypertension Father        aneurysm  . Stroke Father   . Stroke Other   . Colon cancer Paternal Grandfather   . Breast cancer Neg Hx     Social History   Tobacco Use  .  Smoking status: Former Smoker    Packs/day: 1.00    Years: 50.00    Pack years: 50.00    Types: Cigarettes    Quit date: 10/14/2010    Years since quitting: 9.0  . Smokeless tobacco: Never Used  Substance Use Topics  . Alcohol use: No  . Drug use: No    Home Medications Prior to Admission medications   Medication Sig Start Date End Date Taking? Authorizing Provider  acetaminophen (TYLENOL) 325 MG tablet Take 650 mg by mouth every 6 (six) hours as needed for headache (pain).   Yes [provider]  amLODipine (NORVASC) 10 MG tablet Place 1 tablet (10 mg total) into feeding tube daily. 11/06/17  Yes Florencia Reasons, MD  aspirin EC 81 MG tablet Take 81 mg by mouth daily.   Yes [provider]  atorvastatin (LIPITOR) 20 MG tablet Take 20 mg by mouth daily.   Yes [provider]  clopidogrel (PLAVIX) 75 MG tablet Take 75 mg by mouth daily.   Yes [provider]  cyclobenzaprine (FLEXERIL) 10 MG tablet Take 10 mg by mouth 3 (three) times daily as needed for muscle spasms.  09/14/19  Yes [provider]  diphenoxylate-atropine (LOMOTIL) 2.5-0.025 MG tablet Take 1 tablet by mouth 3 (three) times daily. 08/27/19  Yes Domenic Polite, MD  esomeprazole (NEXIUM) 40 MG capsule Take 40 mg by mouth daily. 07/29/18  Yes [provider]  levETIRAcetam (KEPPRA) 500 MG tablet TAKE 1 TABLET BY MOUTH TWICE A DAY 10/04/19  Yes Dohmeier, Asencion Partridge, MD  loperamide (IMODIUM) 2 MG capsule Take 2 mg by mouth daily as needed for diarrhea or loose stools.    Yes [provider]  losartan (COZAAR) 100 MG tablet Take 50 mg by mouth daily. 09/28/19  Yes [provider]  metoprolol succinate (TOPROL-XL) 100 MG 24 hr tablet Take 100 mg by mouth daily. Take with or immediately following a meal.   Yes [provider]  montelukast (SINGULAIR) 10 MG tablet Take 10 mg by mouth daily. 08/09/18  Yes [provider]  nitroGLYCERIN (NITROSTAT) 0.4 MG SL  tablet Place 1 tablet (0.4 mg total) under the tongue every 5 (five) minutes as needed for chest pain. 11/08/15  Yes [place], Peter M, MD  Pancrelipase, Lip-Prot-Amyl, (ZENPEP) [phone] units CPEP Take 1 capsule by mouth 3 (three) times daily with meals.   Yes [provider]  pantoprazole (PROTONIX) 40 MG tablet Take 40 mg by mouth daily.   Yes [provider]  vitamin  B-12 (CYANOCOBALAMIN) 500 MCG tablet Take 500 mcg by mouth daily.   Yes [provider]  dexamethasone (DECADRON) 6 MG tablet Take 1 tablet (6 mg total) by mouth daily for 5 days. 10/20/19 10/25/19  Terrilee Croak, MD  Vitamin D, Ergocalciferol, (DRISDOL) 1.25 MG (50000 UT) CAPS capsule Take 50,000 Units by mouth every Wednesday.     [provider]    Allergies    Adhesive [tape], Codeine, and Iodinated diagnostic agents  Review of Systems   Review of Systems  Constitutional: Positive for activity change (confusion). Negative for chills and fever.  HENT: Negative for congestion and rhinorrhea.   Eyes: Negative for redness and visual disturbance.  Respiratory: Negative for shortness of breath and wheezing.   Cardiovascular: Negative for chest pain and palpitations.  Gastrointestinal: Negative for nausea and vomiting.  Genitourinary: Negative for dysuria and urgency.  Musculoskeletal: Negative for arthralgias and myalgias.  Skin: Negative for pallor and wound.  Neurological: Positive for weakness. Negative for dizziness and headaches.    Physical Exam Updated Vital Signs BP (!) 158/68   Pulse 87   Temp 97.9 F (36.6 C)   Resp 16   SpO2 94%   Physical Exam Vitals and nursing note reviewed.  Constitutional:      General: She is not in acute distress.    Appearance: She is well-developed. She is not diaphoretic.  HENT:     Head: Normocephalic and atraumatic.  Eyes:     Pupils: Pupils are equal, round, and reactive to light.  Cardiovascular:     Rate and Rhythm: Normal rate and  regular rhythm.     Heart sounds: No murmur. No friction rub. No gallop.   Pulmonary:     Effort: Pulmonary effort is normal.     Breath sounds: No wheezing or rales.  Abdominal:     General: There is no distension.     Palpations: Abdomen is soft.     Tenderness: There is no abdominal tenderness.     Comments: Right lower quadrant of her abdomen has a ostomy in place.  No significant erythema or tenderness to the area.  She has a ecchymosis to the left lower quadrant there is slight oozing of blood.  Musculoskeletal:        General: No tenderness.     Cervical back: Normal range of motion and neck supple.  Skin:    General: Skin is warm and dry.  Neurological:     Mental Status: She is alert and oriented to person, place, and time.  Psychiatric:        Behavior: Behavior normal.     ED Results / Procedures / Treatments   Labs (all labs ordered are listed, but only abnormal results are displayed) Labs Reviewed  CBC WITH DIFFERENTIAL/PLATELET - Abnormal; Notable for the following components:      Result Value   WBC 19.9 (*)    RBC 3.43 (*)    Hemoglobin 9.8 (*)    HCT 30.3 (*)    Neutro Abs 13.5 (*)    Monocytes Absolute 2.1 (*)    Abs Immature Granulocytes 0.68 (*)    All other components within normal limits  COMPREHENSIVE METABOLIC PANEL - Abnormal; Notable for the following components:   CO2 19 (*)    BUN 50 (*)    Creatinine, Ser 1.89 (*)    Calcium 8.7 (*)    Total Protein 6.2 (*)    Albumin 3.1 (*)    GFR calc non  Af Amer 26 (*)    GFR calc Af Amer 30 (*)    All other components within normal limits    EKG None  Radiology No results found.  Procedures Procedures (including critical care time)  Medications Ordered in ED Medications  sodium chloride 0.9 % bolus 1,000 mL (0 mLs Intravenous Stopped 10/21/19 2141)    ED Course  I have reviewed the triage vital signs and the nursing notes.  Pertinent labs & imaging results that were available during my  care of the patient were reviewed by me and considered in my medical decision making (see chart for details).    MDM Rules/Calculators/A&P                      74 yo F recently here in the hospital for the novel coronavirus, at that time required oxygen and was titrated off and had some improvement was discharged home yesterday.  Patient is returned stating that she still feels unwell and that she is a little bit slower than she normally is.  Will obtain a laboratory evaluation chest x-ray reassess.  Chest x-ray with no focal infiltrate or pneumothorax.  Renal function appears to be at her baseline.  She has a chronic metabolic acidosis likely secondary to her high output.  She does have a mild worsening leukocytosis. Wonder if this is due to steroid use in the hospital.  Hemoglobin is at baseline.  We will have the patient return for any worsening.  PCP follow-up.  Patient contacted her family to come pick her up which they are declining to do.  She states that she is unable to take care of herself at home and they are not willing to take care of her.  We will have social work look into her case and see if she can be placed into a rehab facility or get home health.  10:41 PM:  I have discussed the diagnosis/risks/treatment options with the patient and believe the pt to be eligible for discharge home to follow-up with PCP. We also discussed returning to the ED immediately if new or worsening sx occur. We discussed the sx which are most concerning (e.g., sudden worsening pain, fever, inability to tolerate by mouth) that necessitate immediate return. Medications administered to the patient during their visit and any new prescriptions provided to the patient are listed below.  Medications given during this visit Medications  sodium chloride 0.9 % bolus 1,000 mL (0 mLs Intravenous Stopped 10/21/19 2141)     The patient appears reasonably screen and/or stabilized for discharge and I doubt any other  medical condition or other [facility] requiring further screening, evaluation, or treatment in the ED at this time prior to discharge.   Final Clinical Impression(s) / ED Diagnoses Final diagnoses:  COVID-19 virus infection    Rx / DC Orders ED Discharge Orders    None       Deno Etienne, DO 10/21/19 2241    Deno Etienne, DO 10/22/19 0123

## 2019-10-22 DIAGNOSIS — U071 COVID-19: Secondary | ICD-10-CM | POA: Diagnosis not present

## 2019-10-22 LAB — URINALYSIS, ROUTINE W REFLEX MICROSCOPIC
Bilirubin Urine: NEGATIVE
Glucose, UA: NEGATIVE mg/dL
Hgb urine dipstick: NEGATIVE
Ketones, ur: NEGATIVE mg/dL
Leukocytes,Ua: NEGATIVE
Nitrite: NEGATIVE
Protein, ur: NEGATIVE mg/dL
Specific Gravity, Urine: 1.004 — ABNORMAL LOW (ref 1.005–1.030)
pH: 5 (ref 5.0–8.0)

## 2019-10-22 MED ORDER — LEVETIRACETAM 500 MG PO TABS
500.0000 mg | ORAL_TABLET | Freq: Two times a day (BID) | ORAL | Status: DC
  Administered 2019-10-22: 500 mg via ORAL
  Filled 2019-10-22: qty 1

## 2019-10-22 MED ORDER — CLOPIDOGREL BISULFATE 75 MG PO TABS
75.0000 mg | ORAL_TABLET | Freq: Every day | ORAL | Status: DC
  Administered 2019-10-22: 75 mg via ORAL
  Filled 2019-10-22: qty 1

## 2019-10-22 MED ORDER — ASPIRIN EC 81 MG PO TBEC
81.0000 mg | DELAYED_RELEASE_TABLET | Freq: Every day | ORAL | Status: DC
  Administered 2019-10-22: 81 mg via ORAL

## 2019-10-22 MED ORDER — AMLODIPINE BESYLATE 5 MG PO TABS
10.0000 mg | ORAL_TABLET | Freq: Every day | ORAL | Status: DC
  Administered 2019-10-22: 10 mg

## 2019-10-22 MED ORDER — DEXAMETHASONE 4 MG PO TABS
6.0000 mg | ORAL_TABLET | Freq: Every day | ORAL | Status: DC
  Administered 2019-10-22: 6 mg via ORAL
  Filled 2019-10-22: qty 1

## 2019-10-22 MED ORDER — PANTOPRAZOLE SODIUM 40 MG PO PACK
40.0000 mg | PACK | Freq: Every day | ORAL | Status: DC
  Administered 2019-10-22: 40 mg via ORAL
  Filled 2019-10-22: qty 20

## 2019-10-22 MED ORDER — DEXAMETHASONE 4 MG PO TABS: 6.0000 mg | ORAL_TABLET | Freq: Every day | ORAL | Status: DC

## 2019-10-22 MED ORDER — LOSARTAN POTASSIUM 50 MG PO TABS: 50.0000 mg | ORAL_TABLET | Freq: Every day | ORAL | Status: DC

## 2019-10-22 MED ORDER — METOPROLOL SUCCINATE ER 100 MG PO TB24
100.0000 mg | ORAL_TABLET | Freq: Every day | ORAL | Status: DC
  Administered 2019-10-22: 100 mg via ORAL
  Filled 2019-10-22: qty 1

## 2019-10-22 MED ORDER — ATORVASTATIN CALCIUM 20 MG PO TABS
20.0000 mg | ORAL_TABLET | Freq: Every day | ORAL | Status: DC
  Filled 2019-10-22: qty 1

## 2019-10-22 NOTE — Progress Notes (Signed)
Patient was accepted by [facility] for [facility] services. CSW notified patient's daughter who was agreeable with this. Patient's daughter to pick up patient. RN and EDP made aware.   [place], LCSW Transitions of Care Department [facility] ED [phone]

## 2019-10-22 NOTE — ED Provider Notes (Addendum)
Brief update note  Summary:  74 year old lady multiple medical problems recent admission for [id number], AKI, metabolic derangements, presented to ER after family had difficulty caring for patient post discharge, family reported confusion, not willing to take her back.  Awaiting social work consult this morning to discuss placement options.  Reviewed home medications, placed orders for home meds.  Patient no new symptoms, vital signs stable this morning. Pt currently AAOx3. Patient tolerating breakfast without difficulty.    Plan: Social work consult this morning Home meds    Lucrezia Starch, MD 10/22/19 [phone]

## 2019-10-22 NOTE — ED Notes (Signed)
Applied 4x4 gauze to left lower abdomen to small amount of small slowly bleeding area.

## 2019-10-22 NOTE — TOC Initial Note (Signed)
Transition of Care [facility]) - Initial/Assessment Note    Patient Details  Name: [name] MRN: [id number] Date of Birth: May 07, 1946  Transition of [facility]) CM/SW Contact:    Janace Hoard, LCSW Phone Number: 10/22/2019, 9:56 AM  Clinical Narrative:                 CSW spoke with patient's daughter, [name] about patient's needs. [name] reports patient was discharged from hospital admission the day before yesterday, but on yesterday patient had increased confusion and was not doing well, so she brought patient to the hospital. [name] reports patient was to begin [facility] services with [facility], but due to insurance they were not able to take her. From patient's recent admission, patient did not have a skillable need for SNF. CSW explained to [name] that patient can go to a ALF under her Medicaid, but we do not place people into ALFs from the ED and patient is also COVID+. [name] is agreeable with CSW finding another [facility] agency for patient to return home.   CSW spoke with Amy with [facility] who reports they accepted patient, but after running her insurance they found that they could not provide services. CSW has contacted [facility] to see if they can take patient, awaiting call back. [facility] is unable to take patient.   Expected Discharge Plan: [place] Barriers to Discharge: No Barriers Identified   Patient Goals and CMS Choice Patient states their goals for this hospitalization and ongoing recovery are:: Per patient's daughter, "to become more independent" CMS Medicare.gov Compare Post Acute Care list provided to:: Patient Represenative (must comment)(Daughter) Choice offered to / list presented to : Adult Children  Expected Discharge Plan and Services Expected Discharge Plan: [place] In-house Referral: Clinical Social Work   Post Acute Care Choice: [place] arrangements for the past 2 months: Single Family Home              HH Arranged: RN, PT, OT, Nurse's Aide, Social Work          Prior Living Arrangements/Services Living arrangements for the past 2 months: [place] Lives with:: Self Patient language and need for interpreter reviewed:: Yes Do you feel safe going back to the place where you live?: Yes      Need for Family Participation in Patient Care: Yes (Comment) Care giver support system in place?: Yes (comment)   Criminal Activity/Legal Involvement Pertinent to Current Situation/Hospitalization: No - Comment as needed  Activities of Daily Living      Permission Sought/Granted Permission sought to share information with : Facility Sport and exercise psychologist    Share Information with NAME: [name]  Permission granted to share info w AGENCY: [facility]  Permission granted to share info w Relationship: daughter  Permission granted to share info w Contact Information: [phone]  Emotional Assessment Appearance:: (Unable to assess) Attitude/Demeanor/Rapport: Unable to Assess Affect (typically observed): Unable to Assess Orientation: : Oriented to Self, Oriented to Place, Oriented to Situation, Oriented to  Time(daugher reports confusion at times (new onset)) Alcohol / Substance Use: Not Applicable Psych Involvement: No (comment)  Admission diagnosis:  Covid+, Disorientation Patient Active Problem List   Diagnosis Date Noted  . Hyperkalemia 10/16/2019  . Bilateral pneumonia 10/16/2019  . Acute metabolic encephalopathy 17/51/0258  . Seizures ([place]) 01/07/2019  . Cerebrovascular accident (CVA) due to occlusion of left carotid artery ([place]) 01/07/2019  . CAD, multiple vessel 01/07/2019  . H/O ischemic bowel  disease 01/07/2019  . Acute bronchitis 12/04/2018  . Oral phase dysphagia 12/04/2018  . CKD (chronic kidney disease) stage 3, GFR 30-59 ml/min 11/04/2018  . Seizure ([place]) 11/03/2018  . 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 Scar of forehead 01/06/2018  . Traumatic  ulceration of tongue 12/04/2017  . Ileostomy in place [facility]) 10/23/2017  . Pressure injury of skin 10/19/2017  . 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  Ileostomy 10/18/2017 10/14/2017  . Ischemic colitis ([place]) 10/14/2017  . Acute respiratory failure with hypoxemia ([place]) 10/14/2017  . Acute post-operative pain 10/14/2017  . Acute kidney injury superimposed on CKD ([place]) 10/14/2017  . Anemia 10/14/2017  . Nocturia more than twice per night 05/07/2017  . Diabetes due to underlying condition w oth circulatory comp ([place]) 05/07/2017  . Atherosclerotic PVD with intermittent claudication ([place]) 05/07/2017  . Poor sleep hygiene 05/07/2017  . 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 [place] lymphadenopathy   . Hemoptysis 02/19/2017  . Pulmonary nodules/lesions, multiple 02/19/2017  . Tobacco use 02/19/2017  . RUQ abdominal pain 01/13/2017  . Left-sided extracranial carotid artery occlusion 11/26/2016  . PAOD (peripheral arterial occlusive disease) ([place]) 11/26/2016  . Encounter for routine follow-up after surgery of the circulatory system 11/26/2016  . Chest pain with moderate risk of acute coronary syndrome 10/27/2015  . Acid indigestion 12/31/2013  . Pain in left hip 08/26/2013  . Screening for gout 05/13/2013  . Disorder of skin or subcutaneous tissue 12/22/2012  . Difficult or painful urination 09/02/2012  . Peripheral blood vessel disorder ([place]) 05/11/2012  . 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[place]) 05/11/2012  . Follow-up examination, following unspecified surgery 05/11/2012  . Occlusion and stenosis of carotid artery without mention of cerebral infarction 04/14/2012  . Fatigue 09/24/2011  . Gonalgia 07/04/2011  . Avitaminosis D 05/02/2011  . Chronic pancreatitis ([place]) 04/30/2011  . Vitamin B12 deficiency 04/30/2011  . 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 Fatty liver 04/30/2011  . Adiposity 04/30/2011  . Other and unspecified general psychiatric examination  04/26/2011  . Abdominal pain 04/09/2011  . Iron deficiency anemia, unspecified  04/09/2011  . Other general symptoms  04/09/2011  . DM (diabetes mellitus screen) 04/09/2011  . Asthma 04/09/2011  . Type 2 diabetes mellitus with diabetic peripheral angiopathy without gangrene ([place]) 01/24/2011  . Abnormal weight gain 11/30/2010  . Dyslipidemia   . Dizziness   . Right-sided extracranial carotid artery stenosis   . Normal nuclear stress test   . Feeling bilious 07/11/2010  . Underimmunization status 07/11/2010  . Pancreas (digestive gland) works poorly 05/30/2010  . B12 deficiency 03/19/2010  . DVT 03/16/2010  . BLIND LOOP SYNDROME 03/16/2010  . Diarrhea 03/16/2010  . Chronic depression 02/23/2010  . Cough 11/17/2009  . Plantar verruca 11/17/2009  . Arteriosclerosis of coronary artery 11/16/2009  . Claudication ([place]) 11/16/2009  . Compulsive tobacco user syndrome 11/16/2009  . HLD (hyperlipidemia) 11/16/2009  . Nontoxic single thyroid nodule 11/16/2009  . Obstructive apnea 11/16/2009  . Radial styloid tenosynovitis 11/16/2009  . LBP (low back pain) 11/16/2009  . LAXATIVE ABUSE 12/17/2007  . BP (high blood pressure) 12/17/2007  . Acid reflux 12/17/2007  . HIATAL HERNIA 12/17/2007  . IBS 12/17/2007  . MELANOSIS COLI 12/17/2007  . ARTHRITIS 12/17/2007  . HEPATOMEGALY 12/17/2007   PCP:  Leanna Battles, MD Pharmacy:   [id number] - [place], [place] [address] [place] [address] Phone: [phone] Fax: [phone]     Social Determinants  of Health (SDOH) Interventions    Readmission Risk Interventions No flowsheet data found.  

## 2019-10-22 NOTE — ED Notes (Signed)
Pt provided with sandwich, crackers and water.

## 2019-10-22 NOTE — ED Notes (Addendum)
Patient requesting IV removal, stating that she is 'bored to death' and wants to leave. Also requesting coffee at this time. Patient requesting colostomy bag to be emptied, RN emptied bag and reminded patient that IV will not get removed until D/C in case of emergency.

## 2019-10-22 NOTE — ED Notes (Signed)
Changed pt colostomy bad. Emptied large amount of dark brown water stool.

## 2019-10-29 DIAGNOSIS — U071 COVID-19: Secondary | ICD-10-CM | POA: Insufficient documentation

## 2019-10-29 DIAGNOSIS — I5041 Acute combined systolic (congestive) and diastolic (congestive) heart failure: Secondary | ICD-10-CM | POA: Insufficient documentation

## 2019-11-17 DIAGNOSIS — U071 COVID-19: Secondary | ICD-10-CM | POA: Insufficient documentation

## 2019-11-17 DIAGNOSIS — J1282 Pneumonia due to coronavirus disease 2019: Secondary | ICD-10-CM | POA: Insufficient documentation

## 2019-12-16 DIAGNOSIS — N390 Urinary tract infection, site not specified: Secondary | ICD-10-CM | POA: Insufficient documentation

## 2019-12-16 DIAGNOSIS — I951 Orthostatic hypotension: Secondary | ICD-10-CM | POA: Insufficient documentation

## 2019-12-16 DIAGNOSIS — R350 Frequency of micturition: Secondary | ICD-10-CM | POA: Insufficient documentation

## 2019-12-28 ENCOUNTER — Other Ambulatory Visit: Payer: Self-pay | Admitting: Neurology

## 2020-01-11 DIAGNOSIS — M542 Cervicalgia: Secondary | ICD-10-CM | POA: Insufficient documentation

## 2020-01-11 DIAGNOSIS — H9203 Otalgia, bilateral: Secondary | ICD-10-CM | POA: Insufficient documentation

## 2020-01-19 ENCOUNTER — Other Ambulatory Visit: Payer: Self-pay | Admitting: Neurology

## 2020-04-19 ENCOUNTER — Telehealth: Payer: Self-pay | Admitting: Gastroenterology

## 2020-04-19 NOTE — Telephone Encounter (Signed)
Patient reports that she is nauseated after her meals. She has tried phenergan with little relief. She will try increasing her nexium to BID until the OV on 8/2.  I have added her to the cancellation list.  She will call back if her symptoms worsen prior to the appt.

## 2020-04-19 NOTE — Telephone Encounter (Signed)
Left message for patient to call back  

## 2020-04-26 ENCOUNTER — Other Ambulatory Visit: Payer: Self-pay | Admitting: *Deleted

## 2020-04-26 ENCOUNTER — Telehealth: Payer: Self-pay

## 2020-04-26 DIAGNOSIS — I779 Disorder of arteries and arterioles, unspecified: Secondary | ICD-10-CM

## 2020-04-26 DIAGNOSIS — Z959 Presence of cardiac and vascular implant and graft, unspecified: Secondary | ICD-10-CM

## 2020-04-26 DIAGNOSIS — I6522 Occlusion and stenosis of left carotid artery: Secondary | ICD-10-CM

## 2020-04-26 DIAGNOSIS — I6521 Occlusion and stenosis of right carotid artery: Secondary | ICD-10-CM

## 2020-04-26 NOTE — Telephone Encounter (Signed)
Pt called with c/o of "blackouts" that have been occurring for almost a year and getting much more frequent. She recently saw her eye doctor who advised her to make an appt with our office. When I reviewed stroke symptoms with pt, she said she felt that when these "blackouts" occur she is having many of those symptoms. I have advised her to call 911/go to ED if that occurs. She has been scheduled for labs and PA appt. Tomorrow morning. Pt is aware and verbalized understanding.

## 2020-04-27 ENCOUNTER — Other Ambulatory Visit: Payer: Self-pay

## 2020-04-27 ENCOUNTER — Ambulatory Visit (HOSPITAL_COMMUNITY)
Admission: RE | Admit: 2020-04-27 | Discharge: 2020-04-27 | Disposition: A | Discharge status: Home / Self Care | Payer: 59 | Source: Ambulatory Visit | Attending: Vascular Surgery | Admitting: Vascular Surgery

## 2020-04-27 ENCOUNTER — Ambulatory Visit (INDEPENDENT_AMBULATORY_CARE_PROVIDER_SITE_OTHER)
Admission: RE | Admit: 2020-04-27 | Discharge: 2020-04-27 | Disposition: A | Discharge status: Home / Self Care | Payer: 59 | Source: Ambulatory Visit | Attending: Vascular Surgery | Admitting: Vascular Surgery

## 2020-04-27 ENCOUNTER — Ambulatory Visit (INDEPENDENT_AMBULATORY_CARE_PROVIDER_SITE_OTHER): Payer: 59 | Admitting: Physician Assistant

## 2020-04-27 VITALS — BP 178/77 | HR 58 | Temp 97.8°F | Resp 20 | Ht 63.0 in | Wt 174.2 lb

## 2020-04-27 DIAGNOSIS — I6521 Occlusion and stenosis of right carotid artery: Secondary | ICD-10-CM

## 2020-04-27 DIAGNOSIS — Z959 Presence of cardiac and vascular implant and graft, unspecified: Secondary | ICD-10-CM

## 2020-04-27 DIAGNOSIS — I6522 Occlusion and stenosis of left carotid artery: Secondary | ICD-10-CM | POA: Diagnosis not present

## 2020-04-27 DIAGNOSIS — I779 Disorder of arteries and arterioles, unspecified: Secondary | ICD-10-CM

## 2020-04-27 NOTE — Progress Notes (Signed)
HISTORY AND PHYSICAL     CC:  follow up. Requesting Provider:  Leanna Battles, MD  HPI: [name] who is here today for follow up and is pt of Dr. Kellie Strong & has hx of left SFA stenting in May 2011.  She has a known left ICA occlusion.    She was last seen on 05/12/2019.   She was not having any claudication or any evidence of ischemia in her feet or legs or non healing wounds.  She did not have any stroke or TIA sx.   She had had some bowel ischemia in January 2019 with bowel resection and colostomy.  She stated that she had a stroke during her hospitalization at that time but did not have any residual effects at her last visit here.  She was seen in January 2020 in the ED for new onset of seizures.  She was hospitalized earlier this year with confusion.  She was covid +.  She had AKI, hyperkalemia, hyponatremia and metabolic acidosis.     The pt returns today for follow up.  She states that she has been having some vision loss.  At first she thought it may be both eyes and unsure if it was only one eye.  After talking for a while, she felt it was only the right eye.  She state this has been happening more frequently.  She denies having any speech difficulties, one sided weakness, or clumsiness.   She recently saw her eye doctor and he referred her to see [place].  She denies any claudication sx or non healing wounds.  Her other major concern is she doesn't have any energy and feels weak.  When asked about her thyroid, she states she has a known nodule, but this hasn't been looked at in a few years.  U/s today of the carotids revealed a thyroid nodule.  She also c/o swelling in both legs.    The pt is on a statin for cholesterol management.    The pt is on an aspirin.    Other AC:  Plavix The pt is on CCB, ARB, BB for hypertension.  The pt does have hx of diabetes.  Currently not on medication for this.  Tobacco hx:  former  Pt does not have family hx of AAA.  Parent had hx of  brain aneurysm.  Past Medical History:  Diagnosis Date  . Anxiety   . Arthropathy, unspecified, site unspecified   . Blind loop syndrome   . Carotid artery disease ([employer] [facility])    documented occlusion of left ICA  . Coronary artery disease    a. known 60-70% mid-LAD stenosis with caths in 1999, 2002, 2006, and 2012 showing stable anatomy b. low-risk NST in 10/2015  . Depression   . Diabetes insipidus ([place])   . Diabetes mellitus   . Diverticulosis   . Dizziness    chronic  . Dyslipidemia   . Dyspnea    with exertion  . Fatty liver   . GERD (gastroesophageal reflux disease)    hiatial hernia  . Hepatomegaly   . Hiatal hernia   . HTN (hypertension)   . Hyperlipidemia   . Hypertension   . Irritable bowel syndrome   . Melanosis coli   . Metatarsal fracture right  . Normal nuclear stress test    nml 01/03/10;06/19/07  . Other, mixed, or unspecified nondependent drug abuse, unspecified   . Pancreatitis, chronic ([place])   . Peripheral vascular disease ([place]) 02/20/10  s/p stent of left SFA  . Seizure ([place]) 11/04/2018  . Sleep apnea   . Stroke ([place])   . Thyroid nodule   . Unspecified essential hypertension   . Vitamin B12 deficiency     Past Surgical History:  Procedure Laterality Date  . ABDOMINAL HYSTERECTOMY     partial  . AUGMENTATION MAMMAPLASTY     bilateral 1976 retro   . BREAST ENHANCEMENT SURGERY    . CARDIAC CATHETERIZATION  07/07/98;08/31/01;03/04/05   60 -70% LAD  . CATARACT EXTRACTION    . COLON RESECTION N/A 10/14/2017   Procedure: EXPLORATORY LAPAROTOMY, EXTENDED RIGHT COLECTOMY; APPLICATION OF ABDOMINAL VACUUM DRESSING;  Surgeon: Joanna Kussmaul, MD;  Location: [facility];  Service: General;  Laterality: N/A;  . ENDOBRONCHIAL ULTRASOUND Bilateral 02/24/2017   Procedure: ENDOBRONCHIAL ULTRASOUND;  Surgeon: Collene Gobble, MD;  Location: [facility];  Service: Cardiopulmonary;  Laterality: Bilateral;  . EYE SURGERY Left Nov. 2016   Cataract  . EYE SURGERY Right 2001    Cataract  . FEMORAL ARTERY STENT     Left leg  . LAPAROTOMY N/A 10/16/2017   Procedure: EXPLORATORY LAPAROTOMY, PARTIAL OMENTECTOMY, RESECTION ISCHEMIC ILEUM 631SH, APPLICATION OF VAC ABDOMINAL DRESSING;  Surgeon: Joanna Gemma, MD;  Location: [facility];  Service: General;  Laterality: N/A;  . LAPAROTOMY N/A 10/18/2017   Procedure: RE-EXPLORATION OF ABDOMEN, ILEOSTOMY CREATION;  Surgeon: Joanna Klein, MD;  Location: [facility];  Service: General;  Laterality: N/A;  . LUNG SURGERY  03/2017   Benign polyps removed  . PATELLA REALIGNMENT Left   . TONSILLECTOMY    . TOTAL ABDOMINAL HYSTERECTOMY    . WRIST SURGERY     right    Allergies  Allergen Reactions  . Adhesive [Tape] Other (See Comments)    "Took off my skin"  . Codeine Other (See Comments)    Altered mental state   . Iodinated Diagnostic Agents Rash    Current Outpatient Medications  Medication Sig Dispense Refill  . acetaminophen (TYLENOL) 325 MG tablet Take 650 mg by mouth every 6 (six) hours as needed for headache (pain).    Marland Kitchen amLODipine (NORVASC) 10 MG tablet Place 1 tablet (10 mg total) into feeding tube daily. 30 tablet 0  . aspirin EC 81 MG tablet Take 81 mg by mouth daily.    Marland Kitchen atorvastatin (LIPITOR) 20 MG tablet Take 20 mg by mouth daily.    . clopidogrel (PLAVIX) 75 MG tablet Take 75 mg by mouth daily.    . cyclobenzaprine (FLEXERIL) 10 MG tablet Take 10 mg by mouth 3 (three) times daily as needed for muscle spasms.     . diphenoxylate-atropine (LOMOTIL) 2.5-0.025 MG tablet Take 1 tablet by mouth 3 (three) times daily. 90 tablet 1  . esomeprazole (NEXIUM) 40 MG capsule Take 40 mg by mouth daily.    Marland Kitchen levETIRAcetam (KEPPRA) 500 MG tablet TAKE 1 TABLET BY MOUTH TWICE A DAY 180 tablet 2  . loperamide (IMODIUM) 2 MG capsule Take 2 mg by mouth daily as needed for diarrhea or loose stools.     Marland Kitchen losartan (COZAAR) 100 MG tablet Take 50 mg by mouth daily.    . metoprolol succinate (TOPROL-XL) 100 MG 24 hr tablet Take 100 mg by mouth  daily. Take with or immediately following a meal.    . montelukast (SINGULAIR) 10 MG tablet Take 10 mg by mouth daily.    . nitroGLYCERIN (NITROSTAT) 0.4 MG SL tablet Place 1 tablet (0.4 mg total) under the tongue every 5 (  five) minutes as needed for chest pain. 90 tablet 3  . Pancrelipase, Lip-Prot-Amyl, (ZENPEP) 40000-126000 units CPEP Take 1 capsule by mouth 3 (three) times daily with meals.    . pantoprazole (PROTONIX) 40 MG tablet Take 40 mg by mouth daily.    . vitamin B-12 (CYANOCOBALAMIN) 500 MCG tablet Take 500 mcg by mouth daily.    . Vitamin D, Ergocalciferol, (DRISDOL) 1.25 MG (50000 UT) CAPS capsule Take 50,000 Units by mouth every Wednesday.      No current facility-administered medications for this visit.    Family History  Problem Relation Age of Onset  . Hypertension Mother   . Osteoarthritis Mother   . Pulmonary embolism Mother   . Hypertension Father        aneurysm  . Stroke Father   . Stroke Other   . Colon cancer Paternal Grandfather   . Breast cancer Neg Hx     Social History   Socioeconomic History  . Marital status: Divorced    Spouse name: Not on file  . Number of children: 2  . Years of education: Not on file  . Highest education level: Not on file  Occupational History  . Occupation: retired    Fish farm manager: DISABLED  Tobacco Use  . Smoking status: Former Smoker    Packs/day: 1.00    Years: 50.00    Pack years: 50.00    Types: Cigarettes    Quit date: 10/14/2010    Years since quitting: 9.5  . Smokeless tobacco: Never Used  Vaping Use  . Vaping Use: Never used  Substance and Sexual Activity  . Alcohol use: No  . Drug use: No  . Sexual activity: Not on file  Other Topics Concern  . Not on file  Social History Narrative  . Not on file   Social Determinants of Health   Financial Resource Strain:   . Difficulty of Paying Living Expenses:   Food Insecurity:   . Worried About Charity fundraiser in the Last Year:   . Arboriculturist in the  Last Year:   Transportation Needs:   . Film/video editor (Medical):   Marland Kitchen Lack of Transportation (Non-Medical):   Physical Activity:   . Days of Exercise per Week:   . Minutes of Exercise per Session:   Stress:   . Feeling of Stress :   Social Connections:   . Frequency of Communication with Friends and Family:   . Frequency of Social Gatherings with Friends and Family:   . Attends Religious Services:   . Active Member of Clubs or Organizations:   . Attends Archivist Meetings:   Marland Kitchen Marital Status:   Intimate Partner Violence:   . Fear of Current or Ex-Partner:   . Emotionally Abused:   Marland Kitchen Physically Abused:   . Sexually Abused:      REVIEW OF SYSTEMS:   [X]  denotes positive finding, [ ]  denotes negative finding Cardiac  Comments:  Chest pain or chest pressure:    Shortness of breath upon exertion:    Short of breath when lying flat:    Irregular heart rhythm:        Vascular    Pain in calf, thigh, or hip brought on by ambulation:    Pain in feet at night that wakes you up from your sleep:     Blood clot in your veins:    Leg swelling:         Pulmonary  Oxygen at home:    Productive cough:     Wheezing:         Neurologic    weakness in arms or legs:  x   Sudden numbness in arms or legs:     Sudden onset of difficulty speaking or slurred speech:    Temporary loss of vision in one eye:     Problems with dizziness:  x       Gastrointestinal    Blood in stool:     Vomited blood:         Genitourinary    Burning when urinating:     Blood in urine:        Psychiatric    Major depression:         Hematologic    Bleeding problems:    Problems with blood clotting too easily:        Skin    Rashes or ulcers:        Constitutional    Fever or chills:      PHYSICAL EXAMINATION:  Today's Vitals   04/27/20 0943 04/27/20 0945  BP: (!) 149/72 (!) 178/77  Pulse: (!) 58   Resp: 20   Temp: 97.8 F (36.6 C)   TempSrc: Temporal   SpO2: 98%    Weight: 174 lb 3.2 oz (79 kg)   Height: 5\' 3"  (1.6 m)    Body mass index is 30.86 kg/m.   General:  WDWN in NAD; vital signs documented above Gait: Not observed HENT: WNL, normocephalic Pulmonary: normal non-labored breathing , without Rales, rhonchi,  wheezing Cardiac: regular HR, without Murmur; without carotid bruits Abdomen: soft, NT, no masses; colostomy bag in place Skin: without rashes Vascular Exam/Pulses:  Right Left  Radial 2+ (normal) 2+ (normal)  Popliteal Unable to palpate   Unable to palpate   DP 2+ (normal) 2+ (normal)  PT Unable to palpate  Unable to palpate    Extremities: without ischemic changes, without Gangrene , without cellulitis; without open wounds;  Musculoskeletal: no muscle wasting or atrophy  Neurologic: A&O X 3;  No focal weakness or paresthesias are detected Psychiatric:  The pt has Normal affect.   Non-Invasive Vascular Imaging:   ABI's/TBI's on 04/27/2020: Right:  0.91/0.44 - great toe pressure:  80 Left:  0.92/0.51 - great toe pressure:  92   Arterial duplex on 04/27/2020: LEFT   PSV cm/sRatioStenosisWaveform Comments  +----------+--------+-----+--------+----------+--------+  CFA Distal148          triphasic       +----------+--------+-----+--------+----------+--------+  DFA    66          monophasic      +----------+--------+-----+--------+----------+--------+  SFA Prox 94          biphasic       +----------+--------+-----+--------+----------+--------+  SFA Mid  78          biphasic       +----------+--------+-----+--------+----------+--------+  SFA Distal85          biphasic       +----------+--------+-----+--------+----------+--------+  POP Distal57          biphasic       +----------+--------+-----+--------+----------+--------+     Left Stent(s):    +---------------+--------+--------+---------+--------+  Distal SFA   PSV cm/sStenosisWaveform Comments  +---------------+--------+--------+---------+--------+  Prox to Stent 90       triphasic      +---------------+--------+--------+---------+--------+  Proximal Stent 162       biphasic       +---------------+--------+--------+---------+--------+  Mid Stent  87       biphasic       +---------------+--------+--------+---------+--------+  Distal Stent  114       biphasic       +---------------+--------+--------+---------+--------+  Distal to Stent102       biphasic       +---------------+--------+--------+---------+--------+   Patent stent with no restenosis.  Non-Invasive Vascular Imaging:   Carotid Duplex on 04/27/2020: Right:  40-59% ICA stenosis Left:  occluded% ICA stenosis Vertebrals: Bilateral vertebral arteries demonstrate antegrade flow.  Subclavians: Normal flow hemodynamics were seen in bilateral subclavian arteries.  Previous ABI's/TBI's on 05/03/2019: Right:  0.96/0.71 - great toe pressure:  107 Left:  0.91/0.77 - great toe pressure:  115  Previous Carotid duplex on 05/03/2019: Right: 1-39% ICA stenosis Left:   occluded Vertebrals: Bilateral vertebral arteries demonstrate antegrade flow.  Subclavians: Normal flow hemodynamics were seen in bilateral subclavian arteries.   ASSESSMENT/PLAN:: 74 y.o. female here for follow up for left SFA stent placement and carotid artery disease.    PAD -left SFA stent is patent and ABI's are 0.9 bilaterally.  She has palpable DP pulses bilaterally -pt will f/u in one year with ABI and LLE arterial duplex.  Carotid stenosis - pt has occluded left ICA.  Her right ICA stenosis is on the very low end of the 40-59%, which is increased from 1-39% at previous visit.  She has been having visual changes.  Hard to determine if this is both eyes or  just the right eye as during our discussion, she wasn't sure if it was both eyes then felt it was only the right eye.  She has not had any other symptoms such as speech issues, one sided weakness or paralysis. She has hx of CKD and most recently had AKI on CKD back in January.  Her last creatinine that I can see is 1.89 6 months ago and was as high as 6.8 also back in January.  Discussed with Dr. Oneida Alar and given her renal issues, cannot get a CTA of the head and neck to further evaluate her carotid disease and data for carotid intervention for <50% stenosis risks outweigh benefits.  Dr. Oneida Alar also recommends continuing asa/plavix/statin.  I have discussed with her to be aware if this happens again, to try to determine if it is both eyes or one eye. She will f/u in 3 months with carotid duplex.  She will call us sooner if she develops new sx or this is happening more frequently.  She expressed understanding.   Thyroid nodule/medical issues -pt having less energy and feeling weak as well as having weight gain despite nausea.  She states the nodule has not been checked in several years.  She states she has not been to see Dr. Sharlett Iles as it has been hard to get an appointment.  We will call their office to see if she can be seen in the next couple of weeks for this as well as check her renal function.   She also has some leg swelling bilaterally.  Recommended leg elevation.  If medical reasons ruled out for swelling, we can see her back for venous reflux study in the future.    [name], [facility] [phone]  Clinic MD:  Pt discussed with Dr. Oneida Alar

## 2020-04-28 ENCOUNTER — Telehealth: Payer: Self-pay

## 2020-04-28 NOTE — Telephone Encounter (Signed)
I have called Dr. Buel Ream office letting them know this pt is not a new pt to them and requesting they call her to set up an appt, per PA notes. I have let pt know they should be calling her to schedule this.

## 2020-05-03 ENCOUNTER — Other Ambulatory Visit: Payer: Self-pay | Admitting: *Deleted

## 2020-05-03 DIAGNOSIS — I6522 Occlusion and stenosis of left carotid artery: Secondary | ICD-10-CM

## 2020-05-08 DIAGNOSIS — G4700 Insomnia, unspecified: Secondary | ICD-10-CM | POA: Insufficient documentation

## 2020-05-09 ENCOUNTER — Other Ambulatory Visit: Payer: Self-pay | Admitting: Internal Medicine

## 2020-05-09 DIAGNOSIS — E041 Nontoxic single thyroid nodule: Secondary | ICD-10-CM

## 2020-05-11 ENCOUNTER — Encounter (HOSPITAL_COMMUNITY): Payer: 59

## 2020-05-11 ENCOUNTER — Ambulatory Visit: Payer: 59

## 2020-05-17 ENCOUNTER — Encounter: Payer: Self-pay | Admitting: Physician Assistant

## 2020-05-17 ENCOUNTER — Ambulatory Visit (INDEPENDENT_AMBULATORY_CARE_PROVIDER_SITE_OTHER): Payer: 59 | Admitting: Physician Assistant

## 2020-05-17 ENCOUNTER — Other Ambulatory Visit (INDEPENDENT_AMBULATORY_CARE_PROVIDER_SITE_OTHER): Payer: 59

## 2020-05-17 VITALS — BP 134/80 | HR 85 | Ht 63.0 in | Wt 174.2 lb

## 2020-05-17 DIAGNOSIS — R1010 Upper abdominal pain, unspecified: Secondary | ICD-10-CM

## 2020-05-17 DIAGNOSIS — R110 Nausea: Secondary | ICD-10-CM

## 2020-05-17 DIAGNOSIS — K861 Other chronic pancreatitis: Secondary | ICD-10-CM

## 2020-05-17 LAB — CBC WITH DIFFERENTIAL/PLATELET
Basophils Absolute: 0.1 10*3/uL (ref 0.0–0.1)
Basophils Relative: 0.6 % (ref 0.0–3.0)
Eosinophils Absolute: 0.3 10*3/uL (ref 0.0–0.7)
Eosinophils Relative: 2.5 % (ref 0.0–5.0)
HCT: 29.2 % — ABNORMAL LOW (ref 36.0–46.0)
Hemoglobin: 9.1 g/dL — ABNORMAL LOW (ref 12.0–15.0)
Lymphocytes Relative: 27.6 % (ref 12.0–46.0)
Lymphs Abs: 3.3 10*3/uL (ref 0.7–4.0)
MCHC: 31.2 g/dL (ref 30.0–36.0)
MCV: 78.4 fl (ref 78.0–100.0)
Monocytes Absolute: 1.2 10*3/uL — ABNORMAL HIGH (ref 0.1–1.0)
Monocytes Relative: 9.9 % (ref 3.0–12.0)
Neutro Abs: 7.1 10*3/uL (ref 1.4–7.7)
Neutrophils Relative %: 59.4 % (ref 43.0–77.0)
Platelets: 313 10*3/uL (ref 150.0–400.0)
RBC: 3.72 Mil/uL — ABNORMAL LOW (ref 3.87–5.11)
RDW: 17.4 % — ABNORMAL HIGH (ref 11.5–15.5)
WBC: 12 10*3/uL — ABNORMAL HIGH (ref 4.0–10.5)

## 2020-05-17 LAB — LIPASE: Lipase: 24 U/L (ref 11.0–59.0)

## 2020-05-17 NOTE — Progress Notes (Signed)
Subjective:    Patient ID: [name], female    DOB: Jun 21, 1946, 74 y.o.   MRN: [id number]  HPI [name] is a pleasant 74 year old white female, established with Dr. Fuller Plan who comes in today with complaints of nausea which has been present over the past couple of months.  She also states that she was recently found to be anemic by her PCP. Patient has multiple comorbidities including multivessel coronary artery disease, prior history of CVA, peripheral arterial disease, carotid disease, adult onset diabetes mellitus, history of DVT, COPD, IBS, chronic pancreatitis, she was diagnosed with a seizure disorder in 2020 and was hospitalized with COVID-19 and bilateral pneumonia January 2021.  She is status post extended right hemicolectomy in January 2019 with ileostomy and then also required small bowel resection during that same admission for ischemic bowel and per notes is felt to have 220 cm of remaining small bowel.  She was last seen in the office in December 2020.  She had flexible sigmoidoscopy March 2020 which showed diffuse mucosal changes in the rectum and sigmoid secondary to diversion colitis. At the time of last office visit she was felt to be having somewhat high ostomy output and was encouraged to continue to use Lomotil 3 times daily and to drink Pedialyte and/or Gatorade as her primary fluids and to stop sodas and caffeine.  Also on twice daily glycopyrrolate and Zenpep for pancreatic insufficiency. She says she has been having nausea over the past couple of months on a daily basis.  She has had some vomiting but mostly queasiness and nausea without vomiting.  Appetite has not been great and she feels uncomfortable and very full postprandially.  She has been using Zofran fairly regularly which is helpful.  She says she is actually been gaining weight and is undergoing work-up for her thyroid currently.  She has been eating very bland and describes upper abdominal pain/discomfort which she  describes as "knots" or twisting type discomfort. Ostomy output has decreased though she says stool still not formed she is not having near the volume that she was previously.  No melena or hematochezia, stools are dark at times. Review of Systems Pertinent positive and negative review of systems were noted in the above HPI section.  All other review of systems was otherwise negative.  Outpatient Encounter Medications as of 05/17/2020  Medication Sig  . acetaminophen (TYLENOL) 325 MG tablet Take 650 mg by mouth every 6 (six) hours as needed for headache (pain).  Marland Kitchen amLODipine (NORVASC) 10 MG tablet Place 1 tablet (10 mg total) into feeding tube daily.  . ARIPiprazole (ABILIFY) 5 MG tablet Take 5 mg by mouth daily.  Marland Kitchen aspirin EC 81 MG tablet Take 81 mg by mouth daily.  Marland Kitchen atorvastatin (LIPITOR) 20 MG tablet Take 20 mg by mouth daily.  . clopidogrel (PLAVIX) 75 MG tablet Take 75 mg by mouth daily.  . cyclobenzaprine (FLEXERIL) 10 MG tablet Take 10 mg by mouth 3 (three) times daily as needed for muscle spasms.   . diphenoxylate-atropine (LOMOTIL) 2.5-0.025 MG tablet Take 1 tablet by mouth 3 (three) times daily.  Marland Kitchen levETIRAcetam (KEPPRA) 500 MG tablet TAKE 1 TABLET BY MOUTH TWICE A DAY  . loperamide (IMODIUM) 2 MG capsule Take 2 mg by mouth daily as needed for diarrhea or loose stools.   Marland Kitchen losartan (COZAAR) 100 MG tablet Take 50 mg by mouth daily.  . magnesium oxide (MAG-OX) 400 MG tablet Take 1 tablet by mouth daily.  . metoprolol succinate (  TOPROL-XL) 100 MG 24 hr tablet Take 100 mg by mouth daily. Take with or immediately following a meal.  . montelukast (SINGULAIR) 10 MG tablet Take 10 mg by mouth daily.  . nitroGLYCERIN (NITROSTAT) 0.4 MG SL tablet Place 1 tablet (0.4 mg total) under the tongue every 5 (five) minutes as needed for chest pain.  . Pancrelipase, Lip-Prot-Amyl, (ZENPEP) 40000-126000 units CPEP Take 1 capsule by mouth 3 (three) times daily with meals.  . pantoprazole (PROTONIX) 40 MG  tablet Take 40 mg by mouth daily.  Marland Kitchen POLY-IRON 150 150 MG capsule Take 150 mg by mouth daily.  . vitamin B-12 (CYANOCOBALAMIN) 500 MCG tablet Take 500 mcg by mouth daily.  . Vitamin D, Ergocalciferol, (DRISDOL) 1.25 MG (50000 UT) CAPS capsule Take 50,000 Units by mouth every Wednesday.   . [DISCONTINUED] esomeprazole (NEXIUM) 40 MG capsule Take 40 mg by mouth daily.   No facility-administered encounter medications on file as of 05/17/2020.   Allergies  Allergen Reactions  . Adhesive [Tape] Other (See Comments)    "Took off my skin"  . Codeine Other (See Comments)    Altered mental state   . Iodinated Diagnostic Agents Rash   Patient Active Problem List   Diagnosis Date Noted  . Hyperkalemia 10/16/2019  . Bilateral pneumonia 10/16/2019  . Acute metabolic encephalopathy 63/87/5643  . Seizures ([place]) 01/07/2019  . Cerebrovascular accident (CVA) due to occlusion of left carotid artery ([place]) 01/07/2019  . CAD, multiple vessel 01/07/2019  . H/O ischemic bowel disease 01/07/2019  . Acute bronchitis 12/04/2018  . Oral phase dysphagia 12/04/2018  . CKD (chronic kidney disease) stage 3, GFR 30-59 ml/min 11/04/2018  . Seizure ([place]) 11/03/2018  . 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 Scar of forehead 01/06/2018  . Traumatic ulceration of tongue 12/04/2017  . Ileostomy in place [facility]) 10/23/2017  . Pressure injury of skin 10/19/2017  . 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  Ileostomy 10/18/2017 10/14/2017  . Ischemic colitis ([place]) 10/14/2017  . Acute respiratory failure with hypoxemia ([place]) 10/14/2017  . Acute post-operative pain 10/14/2017  . Acute kidney injury superimposed on CKD ([place]) 10/14/2017  . Anemia 10/14/2017  . Nocturia more than twice per night 05/07/2017  . Diabetes due to underlying condition w oth circulatory comp ([place]) 05/07/2017  . Atherosclerotic PVD with intermittent claudication ([place]) 05/07/2017  . Poor sleep hygiene 05/07/2017  . Paradoxical insomnia  05/07/2017  . Hilar lymphadenopathy   . Hemoptysis 02/19/2017  . Pulmonary nodules/lesions, multiple 02/19/2017  . Tobacco use 02/19/2017  . RUQ abdominal pain 01/13/2017  . Left-sided extracranial carotid artery occlusion 11/26/2016  . PAOD (peripheral arterial occlusive disease) ([place]) 11/26/2016  . Encounter for routine follow-up after surgery of the circulatory system 11/26/2016  . Chest pain with moderate risk of acute coronary syndrome 10/27/2015  . Acid indigestion 12/31/2013  . Pain in left hip 08/26/2013  . Screening for gout 05/13/2013  . Disorder of skin or subcutaneous tissue 12/22/2012  . Difficult or painful urination 09/02/2012  . Peripheral blood vessel disorder ([place]) 05/11/2012  . 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 Follow-up examination, following unspecified surgery 05/11/2012  . Occlusion and stenosis of carotid artery without mention of cerebral infarction 04/14/2012  . Fatigue 09/24/2011  . Gonalgia 07/04/2011  . Avitaminosis D 05/02/2011  . Chronic pancreatitis ([place]) 04/30/2011  . Vitamin B12 deficiency 04/30/2011  . 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 Fatty liver 04/30/2011  . Adiposity 04/30/2011  . Other and unspecified general psychiatric  examination 04/26/2011  . Abdominal pain 04/09/2011  . Iron deficiency anemia, unspecified  04/09/2011  . Other general symptoms  04/09/2011  . DM (diabetes mellitus screen) 04/09/2011  . Asthma 04/09/2011  . Type 2 diabetes mellitus with diabetic peripheral angiopathy without gangrene ([place]) 01/24/2011  . Abnormal weight gain 11/30/2010  . Dyslipidemia   . Dizziness   . Right-sided extracranial carotid artery stenosis   . Normal nuclear stress test   . Feeling bilious 07/11/2010  . Underimmunization status 07/11/2010  . Pancreas (digestive gland) works poorly 05/30/2010  . B12 deficiency 03/19/2010  . DVT 03/16/2010  . BLIND LOOP SYNDROME 03/16/2010  . Diarrhea 03/16/2010  .  Chronic depression 02/23/2010  . Cough 11/17/2009  . Plantar verruca 11/17/2009  . Arteriosclerosis of coronary artery 11/16/2009  . Claudication ([place]) 11/16/2009  . Compulsive tobacco user syndrome 11/16/2009  . HLD (hyperlipidemia) 11/16/2009  . Nontoxic single thyroid nodule 11/16/2009  . Obstructive apnea 11/16/2009  . Radial styloid tenosynovitis 11/16/2009  . LBP (low back pain) 11/16/2009  . LAXATIVE ABUSE 12/17/2007  . BP (high blood pressure) 12/17/2007  . Acid reflux 12/17/2007  . HIATAL HERNIA 12/17/2007  . IBS 12/17/2007  . MELANOSIS COLI 12/17/2007  . ARTHRITIS 12/17/2007  . HEPATOMEGALY 12/17/2007   Social History   Socioeconomic History  . Marital status: Divorced    Spouse name: Not on file  . Number of children: 2  . Years of education: Not on file  . Highest education level: Not on file  Occupational History  . Occupation: retired    Fish farm manager: DISABLED  Tobacco Use  . Smoking status: Former Smoker    Packs/day: 1.00    Years: 50.00    Pack years: 50.00    Types: Cigarettes    Quit date: 10/14/2010    Years since quitting: 9.5  . Smokeless tobacco: Never Used  Vaping Use  . Vaping Use: Never used  Substance and Sexual Activity  . Alcohol use: No  . Drug use: No  . Sexual activity: Not on file  Other Topics Concern  . Not on file  Social History Narrative  . Not on file   Social Determinants of Health   Financial Resource Strain:   . Difficulty of Paying Living Expenses:   Food Insecurity:   . Worried About Charity fundraiser in the Last Year:   . Arboriculturist in the Last Year:   Transportation Needs:   . Film/video editor (Medical):   Marland Kitchen Lack of Transportation (Non-Medical):   Physical Activity:   . Days of Exercise per Week:   . Minutes of Exercise per Session:   Stress:   . Feeling of Stress :   Social Connections:   . Frequency of Communication with Friends and Family:   . Frequency of Social Gatherings with Friends and  Family:   . Attends Religious Services:   . Active Member of Clubs or Organizations:   . Attends Archivist Meetings:   Marland Kitchen Marital Status:   Intimate Partner Violence:   . Fear of Current or Ex-Partner:   . Emotionally Abused:   Marland Kitchen Physically Abused:   . Sexually Abused:     [name]'s family history includes Colon cancer in her paternal grandfather; Hypertension in her father and mother; Osteoarthritis in her mother; Pulmonary embolism in her mother; Stroke in her father and another family member.      Objective:    Vitals:   05/17/20 1050  BP:  134/80  Pulse: 85  SpO2: 100%    Physical Exam Well-developed well-nourished older white female in no acute distress.  Height, Weight, 174 BMI 30.8  HEENT; nontraumatic normocephalic, EOMI, PE RR R LA, sclera anicteric. Oropharynx; not done today Neck; supple, no JVD Cardiovascular; regular rate and rhythm with S1-S2, no murmur rub or gallop Pulmonary; Clear bilaterally Abdomen; soft, obese, mild tenderness across the upper abdomen nondistended, no palpable mass or hepatosplenomegaly, bowel sounds are active, low midline incisional scar and ostomy in the left mid quadrant with semiformed brown stool Rectal; not done Skin; benign exam, no jaundice rash or appreciable lesions Extremities; no clubbing cyanosis or edema skin warm and dry Neuro/Psych; alert and oriented x4, grossly nonfocal mood and affect appropriate       Assessment & Plan:   #50 74 year old white female with multiple comorbidities who presents now with 93-monthhistory of daily nausea and queasiness as well as upper abdominal discomfort. This is in the setting of prior diagnosis of chronic pancreatitis/pancreatic insufficiency, previous extended right hemicolectomy 2019 and small bowel resection for ischemic bowel with 220 cm of small bowel remaining.  She has end ileostomy. Etiology of nausea is not clear.  She is on numerous medications but has not had any  recent additions other than iron which she just started this week. Rule out peptic ulcer disease, chronic gastropathy, neoplasm, gastroparesis. 2.  New anemia by patient report 3.  Coronary artery disease 4.  History of CVA on Plavix and aspirin 5.  Peripheral arterial disease and carotid disease 6.  COPD 7.  Seizure disorder 8.  Status post COVID-19 infection January 2021 required hospitalization for bilateral pneumonia #9 Adult onset diabetes mellitus  Plan; CBC, c-Met, lipase. Patient will be scheduled for CT of the abdomen and pelvis with oral but no IV contrast If CT is unremarkable will likely need EGD with Dr. SFuller Plan  Also consider gastric emptying scan Patient has Nexium and Protonix on her med list, she is not sure if she is taking both, have advised discontinue Nexium and continue Protonix 40 mg p.o. every morning Continue Zofran 4 mg every 6 to 8 hours as needed for nausea Small bland frequent feedings.    Loann Chahal SGenia HaroldPA-C 05/17/2020   Cc: PLeanna Battles MD

## 2020-05-17 NOTE — Patient Instructions (Addendum)
If you are age 73 or older, your body mass index should be between 23-30. Your Body mass index is 30.87 kg/m. If this is out of the aforementioned range listed, please consider follow up with your Primary Care Provider.  If you are age 56 or younger, your body mass index should be between 19-25. Your Body mass index is 30.87 kg/m. If this is out of the aformentioned range listed, please consider follow up with your Primary Care Provider.   Your provider has requested that you go to the basement level for lab work before leaving today. Press "B" on the elevator. The lab is located at the first door on the left as you exit the elevator.  You have been scheduled for a CT scan of the abdomen and pelvis at [facility] (1st floor).  You are scheduled on 05/31/20 at 7:30 am. You should arrive 15 minutes prior to your appointment time for registration. Please follow the written instructions below on the day of your exam:  WARNING: IF YOU ARE ALLERGIC TO IODINE/X-RAY DYE, PLEASE NOTIFY RADIOLOGY IMMEDIATELY AT [phone]! YOU WILL BE GIVEN A 13 HOUR PREMEDICATION PREP.  1) Do not eat or drink anything after 3:30 am (4 hours prior to your test) 2) You have been given 2 bottles of oral contrast to drink. The solution may taste better if refrigerated, but do NOT add ice or any other liquid to this solution. Shake well before drinking.    Drink 1 bottle of contrast @ 5:30 am (2 hours prior to your exam)  Drink 1 bottle of contrast @ 6:30 am (1 hour prior to your exam)  You may take any medications as prescribed with a small amount of water, if necessary. If you take any of the following medications: METFORMIN, GLUCOPHAGE, GLUCOVANCE, AVANDAMET, RIOMET, FORTAMET, [place] MET, JANUMET, GLUMETZA or METAGLIP, you MAY be asked to HOLD this medication 48 hours AFTER the exam.  The purpose of you drinking the oral contrast is to aid in the visualization of your intestinal tract. The contrast solution  may cause some diarrhea. Depending on your individual set of symptoms, you may also receive an intravenous injection of x-ray contrast/dye. Plan on being at [facility] for 30 minutes or longer, depending on the type of exam you are having performed.  This test typically takes 30-45 minutes to complete.  If you have any questions regarding your exam or if you need to reschedule, you may call the [place] department at [phone] between the hours of 8:00 am and 5:00 pm, Monday-Friday.  ________________________________________________________________________    Due to recent changes in healthcare laws, you may see the results of your imaging and laboratory studies on MyChart before your provider has had a chance to review them.  We understand that in some cases there may be results that are confusing or concerning to you. Not all laboratory results come back in the same time frame and the provider may be waiting for multiple results in order to interpret others.  Please give [place] 48 hours in order for your provider to thoroughly review all the results before contacting the office for clarification of your results.   CONTINUE: pantoprazole 40 mg once daily in the morning. CONTINUE: Zenpep CONTINUE: Zofran 65m take one every 6 hours as needed for nausea.  STOP: Nexium  Please eat small but frequent meals each day

## 2020-05-17 NOTE — Progress Notes (Signed)
Reviewed and agree with management plan.  Brittanyann Wittner T. Delfin Squillace, MD FACG [facility]  

## 2020-05-18 ENCOUNTER — Ambulatory Visit
Admission: RE | Admit: 2020-05-18 | Discharge: 2020-05-18 | Disposition: A | Discharge status: Home / Self Care | Payer: 59 | Source: Ambulatory Visit | Attending: Internal Medicine | Admitting: Internal Medicine

## 2020-05-18 DIAGNOSIS — E041 Nontoxic single thyroid nodule: Secondary | ICD-10-CM

## 2020-05-26 NOTE — Progress Notes (Signed)
Cardiology Office Note    Date:  05/30/2020   ID:  [name], DOB [birth year], MRN [id number]  PCP:  Leanna Battles, MD  Cardiologist:  Dr. Martinique  Chief Complaint  Patient presents with   Coronary Artery Disease    History of Present Illness:  [name] is a 74 y.o. female with PMH of carotid artery disease with known occlusion of left ICA, CAD, DM 2, diabetes insipidus, dizziness, HTN, GERD, chronic pancreatitis, CVA, PAD with stent to the left SFA, and obstructive sleep apnea. She has known history of coronary artery disease with 60-70% stenosis in mid LAD. Cardiac catheterization in 1999, 2002, 2006, 2012 showed stable anatomy.  Her carotid artery disease is being followed yearly by [facility]. Her lower extremity arterial Doppler obtained in February 2018 showed normal flow on the right, mild occlusive disease on the left. Carotid Doppler obtained on 11/26/2016 showed > 50% left mid common carotid artery stenosis, known left internal carotid occlusion, right internal carotid artery suggestive 40-59% stenosis. She was last seen by me in 2017. More recently seen by Almyra Deforest PA-C in 2020. Myoview at that time showed normal perfusion. EF by Myoview and Echo was mildly impaired with EF 45-50%.   She was admitted in November 2020 with AKI due to high output ileostomy and ARB. In January 2021 she was admitted with acute respiratory failure and encephalopathy related to Covid 19 PNA.   On follow up today she notes that she gets tired easily and can't walk any distance without giving out. No chest pain. Has a lot of back problems. Occasional frontal HA.    Past Medical History:  Diagnosis Date   Anxiety    Arthropathy, unspecified, site unspecified    Blind loop syndrome    Carotid artery disease ([place])    documented occlusion of left ICA   Coronary artery disease    a. known 60-70% mid-LAD stenosis with caths in 1999, 2002, 2006, and 2012 showing stable anatomy b. low-risk NST in  10/2015   Depression    Diabetes insipidus ([place])    Diabetes mellitus    Diverticulosis    Dizziness    chronic   Dyslipidemia    Dyspnea    with exertion   Fatty liver    GERD (gastroesophageal reflux disease)    hiatial hernia   Hepatomegaly    Hiatal hernia    HTN (hypertension)    Hyperlipidemia    Hypertension    Irritable bowel syndrome    Melanosis coli    Metatarsal fracture right   Normal nuclear stress test    nml 01/03/10;06/19/07   Other, mixed, or unspecified nondependent drug abuse, unspecified    Pancreatitis, chronic ([place])    Peripheral vascular disease ([place]) 02/20/10   s/p stent of left SFA   Seizure ([place]) 11/04/2018   Sleep apnea    Stroke ([place])    Thyroid nodule    Unspecified essential hypertension    Vitamin B12 deficiency     Past Surgical History:  Procedure Laterality Date   ABDOMINAL HYSTERECTOMY     partial   AUGMENTATION MAMMAPLASTY     bilateral 1976 retro    BREAST ENHANCEMENT SURGERY     CARDIAC CATHETERIZATION  07/07/98;08/31/01;03/04/05   60 -70% LAD   CATARACT EXTRACTION     COLON RESECTION N/A 10/14/2017   Procedure: EXPLORATORY LAPAROTOMY, EXTENDED RIGHT COLECTOMY; APPLICATION OF ABDOMINAL VACUUM DRESSING;  Surgeon: Jovita Kussmaul, MD;  Location: [facility];  Service:  General;  Laterality: N/A;   ENDOBRONCHIAL ULTRASOUND Bilateral 02/24/2017   Procedure: ENDOBRONCHIAL ULTRASOUND;  Surgeon: Collene Gobble, MD;  Location: [facility];  Service: Cardiopulmonary;  Laterality: Bilateral;   EYE SURGERY Left Nov. 2016   Cataract   EYE SURGERY Right 2001   Cataract   FEMORAL ARTERY STENT     Left leg   LAPAROTOMY N/A 10/16/2017   Procedure: EXPLORATORY LAPAROTOMY, PARTIAL OMENTECTOMY, RESECTION ISCHEMIC ILEUM 939QZ, APPLICATION OF VAC ABDOMINAL DRESSING;  Surgeon: Armandina Gemma, MD;  Location: [facility];  Service: General;  Laterality: N/A;   LAPAROTOMY N/A 10/18/2017   Procedure: RE-EXPLORATION OF ABDOMEN,  ILEOSTOMY CREATION;  Surgeon: Stark Klein, MD;  Location: [facility];  Service: General;  Laterality: N/A;   LUNG SURGERY  03/2017   Benign polyps removed   PATELLA REALIGNMENT Left    TONSILLECTOMY     TOTAL ABDOMINAL HYSTERECTOMY     WRIST SURGERY     right    Current Medications: Outpatient Medications Prior to Visit  Medication Sig Dispense Refill   acetaminophen (TYLENOL) 325 MG tablet Take 650 mg by mouth every 6 (six) hours as needed for headache (pain).     amLODipine (NORVASC) 10 MG tablet Place 1 tablet (10 mg total) into feeding tube daily. 30 tablet 0   ARIPiprazole (ABILIFY) 5 MG tablet Take 5 mg by mouth daily.     aspirin EC 81 MG tablet Take 81 mg by mouth daily.     atorvastatin (LIPITOR) 20 MG tablet Take 20 mg by mouth daily.     clopidogrel (PLAVIX) 75 MG tablet Take 75 mg by mouth daily.     cyclobenzaprine (FLEXERIL) 10 MG tablet Take 10 mg by mouth 3 (three) times daily as needed for muscle spasms.      diphenoxylate-atropine (LOMOTIL) 2.5-0.025 MG tablet Take 1 tablet by mouth 3 (three) times daily. 90 tablet 1   levETIRAcetam (KEPPRA) 500 MG tablet TAKE 1 TABLET BY MOUTH TWICE A DAY 180 tablet 2   loperamide (IMODIUM) 2 MG capsule Take 2 mg by mouth daily as needed for diarrhea or loose stools.      losartan (COZAAR) 100 MG tablet Take 50 mg by mouth daily.     magnesium oxide (MAG-OX) 400 MG tablet Take 1 tablet by mouth daily.     metoprolol succinate (TOPROL-XL) 100 MG 24 hr tablet Take 100 mg by mouth daily. Take with or immediately following a meal.     montelukast (SINGULAIR) 10 MG tablet Take 10 mg by mouth daily.     nitroGLYCERIN (NITROSTAT) 0.4 MG SL tablet Place 1 tablet (0.4 mg total) under the tongue every 5 (five) minutes as needed for chest pain. 90 tablet 3   Pancrelipase, Lip-Prot-Amyl, (ZENPEP) 40000-126000 units CPEP Take 1 capsule by mouth 3 (three) times daily with meals.     pantoprazole (PROTONIX) 40 MG tablet Take 40 mg  by mouth daily.     POLY-IRON 150 150 MG capsule Take 150 mg by mouth daily.     vitamin B-12 (CYANOCOBALAMIN) 500 MCG tablet Take 500 mcg by mouth daily.     Vitamin D, Ergocalciferol, (DRISDOL) 1.25 MG (50000 UT) CAPS capsule Take 50,000 Units by mouth every Wednesday.      No facility-administered medications prior to visit.     Allergies:   Adhesive [tape], Codeine, and Iodinated diagnostic agents   Social History   Socioeconomic History   Marital status: Divorced    Spouse name: Not on file  Number of children: 2   Years of education: Not on file   Highest education level: Not on file  Occupational History   Occupation: retired    Fish farm manager: DISABLED  Tobacco Use   Smoking status: Former Smoker    Packs/day: 1.00    Years: 50.00    Pack years: 50.00    Types: Cigarettes    Quit date: 10/14/2010    Years since quitting: 9.6   Smokeless tobacco: Never Used  Vaping Use   Vaping Use: Never used  Substance and Sexual Activity   Alcohol use: No   Drug use: No   Sexual activity: Not on file  Other Topics Concern   Not on file  Social History Narrative   Not on file   Social Determinants of Health   Financial Resource Strain:    Difficulty of Paying Living Expenses:   Food Insecurity:    Worried About Charity fundraiser in the Last Year:    Arboriculturist in the Last Year:   Transportation Needs:    Film/video editor (Medical):    Lack of Transportation (Non-Medical):   Physical Activity:    Days of Exercise per Week:    Minutes of Exercise per Session:   Stress:    Feeling of Stress :   Social Connections:    Frequency of Communication with Friends and Family:    Frequency of Social Gatherings with Friends and Family:    Attends Religious Services:    Active Member of Clubs or Organizations:    Attends Music therapist:    Marital Status:      Family History:  The patient's family history includes Colon  cancer in her paternal grandfather; Hypertension in her father and mother; Osteoarthritis in her mother; Pulmonary embolism in her mother; Stroke in her father and another family member.   ROS:   Please see the history of present illness.    ROS All other systems reviewed and are negative.   PHYSICAL EXAM:   VS:  BP (!) 111/49    Pulse 72    Ht 5\' 3"  (1.6 m)    Wt 175 lb (79.4 kg)    SpO2 100%    BMI 31.00 kg/m    GEN: Well nourished, obese, in no acute distress  HEENT: normal  Neck: no JVD, carotid bruits, or masses Cardiac: RRR; no murmurs, rubs, or gallops,no edema  Respiratory:  clear to auscultation bilaterally, normal work of breathing GI: soft, nontender, nondistended, + BS MS: no deformity or atrophy  Skin: warm and dry, no rash Neuro:  Alert and Oriented x 3, Strength and sensation are intact Psych: euthymic mood, full affect  Wt Readings from Last 3 Encounters:  05/30/20 175 lb (79.4 kg)  05/17/20 174 lb 4 oz (79 kg)  04/27/20 174 lb 3.2 oz (79 kg)      Studies/Labs Reviewed:   EKG:  EKG is not ordered today.    Recent Labs: 10/16/2019: B Natriuretic Peptide 103.1 10/21/2019: ALT 22; BUN 50; Creatinine, Ser 1.89; Potassium 3.8; Sodium 135 05/17/2020: Hemoglobin 9.1; Platelets 313.0   Lipid Panel    Component Value Date/Time   CHOL 106 10/25/2017 0440   TRIG 215 (H) 10/15/2019 2123   HDL 18 (L) 10/25/2017 0440   CHOLHDL 5.9 10/25/2017 0440   VLDL 40 10/25/2017 0440   LDLCALC 48 10/25/2017 0440   Dated 05/08/20: creatinine 1.9. otherwise CMET normal. A1c 5.6%.   Additional studies/ records that  were reviewed today include:   Echo 10/25/2017  LV EF: 55% -   60%  ------------------------------------------------------------------- Indications:      CVA 12.  ------------------------------------------------------------------- History:   PMH:   Coronary artery disease.  Risk factors: Hypertension. Diabetes mellitus.  Dyslipidemia.  ------------------------------------------------------------------- Study Conclusions  - Left ventricle: The cavity size was normal. Wall thickness was   normal. Systolic function was normal. The estimated ejection   fraction was in the range of 55% to 60%. Although no diagnostic   regional wall motion abnormality was identified, this possibility   cannot be completely excluded on the basis of this study. Doppler   parameters are consistent with abnormal left ventricular   relaxation (grade 1 diastolic dysfunction). - Aortic valve: Poorly visualized. There was no stenosis. - Mitral valve: Mildly calcified annulus. There was trivial   regurgitation. - Right ventricle: The cavity size was normal. Systolic function   was normal. - Pulmonary arteries: No complete TR doppler jet so unable to   estimate PA systolic pressure. - Inferior vena cava: The vessel was normal in size. The   respirophasic diameter changes were in the normal range (>= 50%),   consistent with normal central venous pressure.  Impressions:  - Normal LV size with EF 55-60%. Normal RV size and systolic   function. No significant valvular abnormalities.  Myoview 12/18/18: Study Highlights    The left ventricular ejection fraction is mildly decreased (45-54%).  Nuclear stress EF: 48%.  No T wave inversion was noted during stress.  There was no ST segment deviation noted during stress.  This is an intermediate risk study.   No reversible ischemia. LVEF 48% with basal septal dyskinesis. This is an intermediate risk study (LVEF<49%) - recommend correlating findings with echo.   Echo 04/30/19:IMPRESSIONS    1. The left ventricle has mildly reduced systolic function, with an  ejection fraction of 45-50%. The cavity size was normal. There is mildly  increased left ventricular wall thickness. Left ventricular diastolic  Doppler parameters are consistent with  impaired relaxation. Indeterminate  filling pressures The E/e' is 8-15.  2. Moderate hypokinesis of the left ventricular, basal-mid inferolateral  wall.  3. The right ventricle has normal systolic function. The cavity was  normal. There is no increase in right ventricular wall thickness.  4. The mitral valve is abnormal. Mild thickening of the mitral valve  leaflet. Mild calcification of the mitral valve leaflet. There is mild  mitral annular calcification present.  5. The tricuspid valve is grossly normal.  6. The aortic valve is grossly normal. No stenosis of the aortic valve.  7. The aortic root and ascending aorta are normal in size and structure.  8. The inferior vena cava was normal in size with <50% respiratory  variability.  9. The average left ventricular global longitudinal strain is -18.6 %.  10. When compared to the prior study: 10/25/17 EF 55-60%.     ASSESSMENT:    1. Coronary artery disease involving native coronary artery of native heart without angina pectoris   2. PAOD (peripheral arterial occlusive disease) ([place])   3. Left-sided extracranial carotid artery occlusion   4. Essential hypertension      PLAN:  In order of problems listed above:  1. CAD: She has a known 1 to 70% stenosis in the LAD on multiple previous cardiac catheterizations.  Myoview in March 2020 showed normal perfusion. No angina. Continue beta blocker, antiplatelet therapy, statin, amlodipine.  2. Hypertension: Blood pressure is well controlled on metoprolol,  amlodipine, losartan  3. Bilateral carotid artery disease: Last carotid ultrasound obtained on 04/27/20 showed 40 to 59% right ICA stenosis, totally occluded left carotid artery.  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 OSA  7. PAD. LE arterial dopplers in July showed mild disease.   8. Dyslipidemia. On statin. Labs followed by primary care. I am unable to view last results.    Follow up in  one year.  Medication Adjustments/Labs and Tests Ordered: Current medicines are reviewed at length with the patient today.  Concerns regarding medicines are outlined above.  Medication changes, Labs and Tests ordered today are listed in the Patient Instructions below. There are no Patient Instructions on file for this visit.   Signed, Ladarien Beeks Martinique, MD  05/30/2020 2:26 PM    [address], [place], [place]  [address] Phone: [phone]; Fax: [phone]

## 2020-05-30 ENCOUNTER — Encounter: Payer: Self-pay | Admitting: Cardiology

## 2020-05-30 ENCOUNTER — Ambulatory Visit (INDEPENDENT_AMBULATORY_CARE_PROVIDER_SITE_OTHER): Payer: 59 | Admitting: Cardiology

## 2020-05-30 ENCOUNTER — Other Ambulatory Visit: Payer: Self-pay

## 2020-05-30 VITALS — BP 111/49 | HR 72 | Ht 63.0 in | Wt 175.0 lb

## 2020-05-30 DIAGNOSIS — I779 Disorder of arteries and arterioles, unspecified: Secondary | ICD-10-CM | POA: Diagnosis not present

## 2020-05-30 DIAGNOSIS — I6522 Occlusion and stenosis of left carotid artery: Secondary | ICD-10-CM | POA: Diagnosis not present

## 2020-05-30 DIAGNOSIS — I10 Essential (primary) hypertension: Secondary | ICD-10-CM

## 2020-05-30 DIAGNOSIS — I2510 Atherosclerotic heart disease of native coronary artery without angina pectoris: Secondary | ICD-10-CM

## 2020-05-31 ENCOUNTER — Other Ambulatory Visit: Payer: Self-pay

## 2020-05-31 ENCOUNTER — Telehealth: Payer: Self-pay | Admitting: Physician Assistant

## 2020-05-31 ENCOUNTER — Ambulatory Visit (HOSPITAL_COMMUNITY): Payer: 59

## 2020-05-31 DIAGNOSIS — R112 Nausea with vomiting, unspecified: Secondary | ICD-10-CM

## 2020-05-31 DIAGNOSIS — R1030 Lower abdominal pain, unspecified: Secondary | ICD-10-CM

## 2020-05-31 DIAGNOSIS — D509 Iron deficiency anemia, unspecified: Secondary | ICD-10-CM

## 2020-05-31 DIAGNOSIS — K859 Acute pancreatitis without necrosis or infection, unspecified: Secondary | ICD-10-CM

## 2020-05-31 NOTE — Telephone Encounter (Signed)
Tried to call the patient to offer helping her reschedule. No answer. No voicemail

## 2020-05-31 NOTE — Telephone Encounter (Signed)
Patient rescheduled for her CT on 06/05/20 at [facility] in [facility].  She agrees to [facility] to [facility] through the main entrance to [address] on 06/02/20 arriving at 8:45 am.  Written instructions will be supplied to her. She will fast for 4 hours prior to imagine. Drink bottle 1 of contrast at 7 am and bottle 2 at 8 am.

## 2020-06-01 ENCOUNTER — Other Ambulatory Visit: Payer: 59

## 2020-06-01 DIAGNOSIS — K859 Acute pancreatitis without necrosis or infection, unspecified: Secondary | ICD-10-CM

## 2020-06-01 DIAGNOSIS — D509 Iron deficiency anemia, unspecified: Secondary | ICD-10-CM

## 2020-06-01 DIAGNOSIS — I700 Atherosclerosis of aorta: Secondary | ICD-10-CM | POA: Insufficient documentation

## 2020-06-01 DIAGNOSIS — D6869 Other thrombophilia: Secondary | ICD-10-CM | POA: Insufficient documentation

## 2020-06-01 DIAGNOSIS — R112 Nausea with vomiting, unspecified: Secondary | ICD-10-CM

## 2020-06-01 DIAGNOSIS — R1030 Lower abdominal pain, unspecified: Secondary | ICD-10-CM

## 2020-06-02 LAB — IRON,TIBC AND FERRITIN PANEL
Ferritin: 12 ng/mL — ABNORMAL LOW (ref 15–150)
Iron Saturation: 6 % — CL (ref 15–55)
Iron: 37 ug/dL (ref 27–139)
Total Iron Binding Capacity: 581 ug/dL (ref 250–450)
UIBC: 544 ug/dL — ABNORMAL HIGH (ref 118–369)

## 2020-06-05 ENCOUNTER — Encounter (HOSPITAL_BASED_OUTPATIENT_CLINIC_OR_DEPARTMENT_OTHER): Payer: Self-pay

## 2020-06-05 ENCOUNTER — Other Ambulatory Visit: Payer: Self-pay

## 2020-06-05 ENCOUNTER — Ambulatory Visit (HOSPITAL_BASED_OUTPATIENT_CLINIC_OR_DEPARTMENT_OTHER)
Admission: RE | Admit: 2020-06-05 | Discharge: 2020-06-05 | Disposition: A | Discharge status: Home / Self Care | Payer: 59 | Source: Ambulatory Visit | Attending: Physician Assistant | Admitting: Physician Assistant

## 2020-06-05 DIAGNOSIS — K859 Acute pancreatitis without necrosis or infection, unspecified: Secondary | ICD-10-CM | POA: Diagnosis present

## 2020-06-05 DIAGNOSIS — R112 Nausea with vomiting, unspecified: Secondary | ICD-10-CM

## 2020-06-05 DIAGNOSIS — R1030 Lower abdominal pain, unspecified: Secondary | ICD-10-CM | POA: Diagnosis present

## 2020-06-07 ENCOUNTER — Other Ambulatory Visit: Payer: Self-pay | Admitting: Internal Medicine

## 2020-06-07 DIAGNOSIS — Z1231 Encounter for screening mammogram for malignant neoplasm of breast: Secondary | ICD-10-CM

## 2020-06-08 ENCOUNTER — Telehealth: Payer: Self-pay

## 2020-06-08 NOTE — Telephone Encounter (Signed)
Ok to hold from my standpoint.  [name]

## 2020-06-08 NOTE — Telephone Encounter (Signed)
Dr. Martinique, This patient has known 60-70% stenosis in the LAD. Last myoview 12/2018 without ischemia. We are asked to hold plavix for EGD.  She is also followed by [facility], Dr. Donzetta Matters, with carotid artery stenosis and stent to left SFA. I will also reach out to them.  OK to hold plavix 5 days for EGD?

## 2020-06-08 NOTE — Telephone Encounter (Signed)
[facility] Pre-operative Risk Assessment     Request for surgical clearance:     Endoscopy Procedure  What type of surgery is being performed?     EGD  When is this surgery scheduled?     06/27/20  What type of clearance is required ?   Pharmacy  Are there any medications that need to be held prior to surgery and how long? Plavix 5 days  Practice name and name of physician performing surgery?      [facility]  What is your office phone and fax number?      Phone- [phone]  [phone]  Anesthesia type (None, local, MAC, general) ?       MAC

## 2020-06-09 NOTE — Telephone Encounter (Signed)
Dr.Cain sent staff message that okayed patient can hold Plavix on 06/09/2020.  KL

## 2020-06-09 NOTE — Telephone Encounter (Signed)
   Primary Cardiologist: Peter Martinique, MD  Chart reviewed as part of pre-operative protocol coverage. Given past medical history and time since last visit, based on ACC/AHA guidelines, [name] would be at acceptable risk for the planned procedure without further cardiovascular testing  She is to hold Plavix for 5 days prior to the procedure. She is listed to have procedure on 06/27/2020. I have spoken to her by phone to discuss this with her. She will stop Plavix on 06/22/2020. She verbalizes understanding.   I will route this recommendation to the requesting party via Epic fax function and remove from pre-op pool.  Please call with questions.  Phill Myron. Shyteria Lewis DNP, ANP, AACC  06/09/2020, 1:12 PM

## 2020-06-09 NOTE — Telephone Encounter (Signed)
Awaiting okay from Dr. Donzetta Matters.

## 2020-06-13 ENCOUNTER — Other Ambulatory Visit: Payer: Self-pay

## 2020-06-13 ENCOUNTER — Ambulatory Visit (AMBULATORY_SURGERY_CENTER): Payer: Self-pay | Admitting: *Deleted

## 2020-06-13 VITALS — Ht 63.0 in | Wt 173.0 lb

## 2020-06-13 DIAGNOSIS — R1010 Upper abdominal pain, unspecified: Secondary | ICD-10-CM

## 2020-06-13 DIAGNOSIS — R112 Nausea with vomiting, unspecified: Secondary | ICD-10-CM

## 2020-06-13 NOTE — Progress Notes (Signed)
cov vax x 2    No egg or soy allergy known to patient  No issues with past sedation with any surgeries or procedures no intubation problems in the past - tongue messed up from intubation per pt- states tube left in too long  No FH of Malignant Hyperthermia No diet pills per patient No home 02 use per patient  On  blood thinners per patient - ok to hold Plavix x 5 days- stay on ASA  Pt denies issues with constipation  No A fib or A flutter  EMMI video to pt or via MyChart  COVID 19 guidelines implemented in PV today with Pt and RN   Pt states she had some vaginal spotting this morning- instructed her to call her PCP about this issue    Per pt last seizure 1 yr ago- on Keppra for seizures    Due to the COVID-19 pandemic we are asking patients to follow these guidelines. Please only bring one care partner. Please be aware that your care partner may wait in the car in the parking lot or if they feel like they will be too hot to wait in the car, they may wait in the lobby on the 4th floor. All care partners are required to wear a mask the entire time (we do not have any that we can provide them), they need to practice social distancing, and we will do a Covid check for all patient's and care partners when you arrive. Also we will check their temperature and your temperature. If the care partner waits in their car they need to stay in the parking lot the entire time and we will call them on their cell phone when the patient is ready for discharge so they can bring the car to the front of the building. Also all patient's will need to wear a mask into building.

## 2020-06-14 ENCOUNTER — Ambulatory Visit: Payer: 59

## 2020-06-27 ENCOUNTER — Other Ambulatory Visit: Payer: Self-pay

## 2020-06-27 ENCOUNTER — Ambulatory Visit (AMBULATORY_SURGERY_CENTER): Payer: 59 | Admitting: Gastroenterology

## 2020-06-27 ENCOUNTER — Encounter: Payer: Self-pay | Admitting: Gastroenterology

## 2020-06-27 VITALS — BP 147/51 | HR 58 | Temp 98.6°F | Resp 11 | Ht 63.0 in | Wt 173.0 lb

## 2020-06-27 DIAGNOSIS — R1010 Upper abdominal pain, unspecified: Secondary | ICD-10-CM

## 2020-06-27 DIAGNOSIS — K449 Diaphragmatic hernia without obstruction or gangrene: Secondary | ICD-10-CM

## 2020-06-27 DIAGNOSIS — R110 Nausea: Secondary | ICD-10-CM

## 2020-06-27 MED ORDER — ZENPEP 40000-126000 UNITS PO CPEP: 2.0000 | ORAL_CAPSULE | Freq: Three times a day (TID) | ORAL | 0 refills | Status: DC

## 2020-06-27 MED ORDER — PANTOPRAZOLE SODIUM 40 MG PO TBEC: 40.0000 mg | DELAYED_RELEASE_TABLET | Freq: Two times a day (BID) | ORAL | 3 refills | Status: DC

## 2020-06-27 MED ORDER — SODIUM CHLORIDE 0.9 % IV SOLN: 500.0000 mL | Freq: Once | INTRAVENOUS | Status: DC

## 2020-06-27 NOTE — Progress Notes (Signed)
Patient has scabbed area to left lower forarm.

## 2020-06-27 NOTE — Progress Notes (Signed)
Pt's states no medical or surgical changes since previsit or office visit. 

## 2020-06-27 NOTE — Patient Instructions (Signed)
Handout given for GERD.  Resume your Plavix at prior dose today.  Increase your Zenpep to 2 pills three times a day for one week and assess your response.  Increase your pantoprazole to 40 mg twice a day.  Discontinue Lomotil and minimize Imodium.  YOU HAD AN ENDOSCOPIC PROCEDURE TODAY AT [facility]:   Refer to the procedure report that was given to you for any specific questions about what was found during the examination.  If the procedure report does not answer your questions, please call your gastroenterologist to clarify.  If you requested that your care partner not be given the details of your procedure findings, then the procedure report has been included in a sealed envelope for you to review at your convenience later.  YOU SHOULD EXPECT: Some feelings of bloating in the abdomen. Passage of more gas than usual.  Walking can help get rid of the air that was put into your GI tract during the procedure and reduce the bloating. If you had a lower endoscopy (such as a colonoscopy or flexible sigmoidoscopy) you may notice spotting of blood in your stool or on the toilet paper. If you underwent a bowel prep for your procedure, you may not have a normal bowel movement for a few days.  Please Note:  You might notice some irritation and congestion in your nose or some drainage.  This is from the oxygen used during your procedure.  There is no need for concern and it should clear up in a day or so.  SYMPTOMS TO REPORT IMMEDIATELY:   Following upper endoscopy (EGD)  Vomiting of blood or coffee ground material  New chest pain or pain under the shoulder blades  Painful or persistently difficult swallowing  New shortness of breath  Fever of 100F or higher  Black, tarry-looking stools  For urgent or emergent issues, a gastroenterologist can be reached at any hour by calling [phone]. Do not use MyChart messaging for urgent concerns.    DIET:  We do recommend a small  meal at first, but then you may proceed to your regular diet.  Drink plenty of fluids but you should avoid alcoholic beverages for 24 hours.  ACTIVITY:  You should plan to take it easy for the rest of today and you should NOT DRIVE or use heavy machinery until tomorrow (because of the sedation medicines used during the test).    FOLLOW UP: Our staff will call the number listed on your records 48-72 hours following your procedure to check on you and address any questions or concerns that you may have regarding the information given to you following your procedure. If we do not reach you, we will leave a message.  We will attempt to reach you two times.  During this call, we will ask if you have developed any symptoms of COVID 19. If you develop any symptoms (ie: fever, flu-like symptoms, shortness of breath, cough etc.) before then, please call [phone].  If you test positive for Covid 19 in the 2 weeks post procedure, please call and report this information to [place].    If any biopsies were taken you will be contacted by phone or by letter within the next 1-3 weeks.  Please call us at [phone] if you have not heard about the biopsies in 3 weeks.    SIGNATURES/CONFIDENTIALITY: You and/or your care partner have signed paperwork which will be entered into your electronic medical record.  These signatures attest to the  fact that that the information above on your After Visit Summary has been reviewed and is understood.  Full responsibility of the confidentiality of this discharge information lies with you and/or your care-partner.

## 2020-06-27 NOTE — Progress Notes (Signed)
PT taken to PACU. Monitors in place. VSS. Report given to RN. 

## 2020-06-27 NOTE — Op Note (Addendum)
[facility] Patient Name: [name] Procedure Date: 06/27/2020 4:41 PM MRN: [id number] Endoscopist: Ladene Artist , MD Age: 74 Referring MD:  Date of Birth: 1945-10-28 Gender: Female Account #: [id number] Procedure:                Upper GI endoscopy Indications:              Epigastric abdominal pain, Nausea Medicines:                Monitored Anesthesia Care Procedure:                Pre-Anesthesia Assessment:                           - Prior to the procedure, a History and Physical                            was performed, and patient medications and                            allergies were reviewed. The patient's tolerance of                            previous anesthesia was also reviewed. The risks                            and benefits of the procedure and the sedation                            options and risks were discussed with the patient.                            All questions were answered, and informed consent                            was obtained. Prior Anticoagulants: The patient has                            taken Plavix (clopidogrel), last dose was 5 days                            prior to procedure. ASA Grade Assessment: III - A                            patient with severe systemic disease. After                            reviewing the risks and benefits, the patient was                            deemed in satisfactory condition to undergo the                            procedure.  After obtaining informed consent, the endoscope was                            passed under direct vision. Throughout the                            procedure, the patient's blood pressure, pulse, and                            oxygen saturations were monitored continuously. The                            Endoscope was introduced through the mouth, and                            advanced to the second part of duodenum. The upper                             GI endoscopy was accomplished without difficulty.                            The patient tolerated the procedure well. Scope In: Scope Out: Findings:                 Localized mild erythema was found at the                            gastroesophageal junction.                           The exam of the esophagus was otherwise normal.                           A medium-sized hiatal hernia was present.                           A medium amount of food (residue) was found in the                            gastric fundus and in the gastric body. This                            limited the exam of the stomach.                           The exam of the stomach was otherwise normal.                           The duodenal bulb and second portion of the                            duodenum were normal. Complications:            No immediate complications. Estimated Blood Loss:     Estimated blood loss: none. Impression:               -  Distal esophageal erythema.                           - Medium-sized hiatal hernia.                           - A medium amount of food (residue) in the stomach.                           - Normal duodenal bulb and second portion of the                            duodenum.                           - No specimens collected. Recommendation:           - Patient has a contact number available for                            emergencies. The signs and symptoms of potential                            delayed complications were discussed with the                            patient. Return to normal activities tomorrow.                            Written discharge instructions were provided to the                            patient.                           - Patient related that she had a sandwhich at 12                            noon today with EGD scheduled at 4 pm which                            explains retained gastric solids.                            - Fat modified diet long term.                           - Closely follow all antireflux measures long term.                           - Continue present medications.                           - Discontinue Lomotil.                           - Minimize  Imodium.                           - Increase Zenpep to 2 ac tid for 1 week and assess                            response.                           - Increase pantoprazole to 40 mg po bid, 1 year of                            refills.                           - GI office visit with me or Amy Esterwood, PAC in                            6 weeks.                           - Resume Plavix (clopidogrel) at prior dose today.                            Refer to managing physician for further adjustment                            of therapy. Ladene Artist, MD 06/27/2020 5:13:31 PM This report has been signed electronically.

## 2020-06-29 ENCOUNTER — Telehealth: Payer: Self-pay

## 2020-06-29 NOTE — Telephone Encounter (Signed)
  Follow up Call-  Call back number 06/27/2020 12/29/2018  Post procedure Call Back phone  # [phone]  Permission to leave phone message Yes Yes  Some recent data might be hidden     Patient questions:  Do you have a fever, pain , or abdominal swelling? No. Pain Score  0 *  Have you tolerated food without any problems? Yes.    Have you been able to return to your normal activities? Yes.    Do you have any questions about your discharge instructions: Diet   No. Medications  No. Follow up visit  No.  Do you have questions or concerns about your Care? No.  Actions: * If pain score is 4 or above: 1. No action needed, pain <4.Have you developed a fever since your procedure? no  2.   Have you had an respiratory symptoms (SOB or cough) since your procedure? no  3.   Have you tested positive for COVID 19 since your procedure no  4.   Have you had any family members/close contacts diagnosed with the COVID 19 since your procedure?  no   If yes to any of these questions please route to Joylene John, RN and Joella Prince, RN

## 2020-07-06 ENCOUNTER — Other Ambulatory Visit: Payer: Self-pay

## 2020-07-06 ENCOUNTER — Ambulatory Visit
Admission: RE | Admit: 2020-07-06 | Discharge: 2020-07-06 | Disposition: A | Discharge status: Home / Self Care | Payer: 59 | Source: Ambulatory Visit | Attending: Internal Medicine | Admitting: Internal Medicine

## 2020-07-06 DIAGNOSIS — Z1231 Encounter for screening mammogram for malignant neoplasm of breast: Secondary | ICD-10-CM

## 2020-07-07 ENCOUNTER — Encounter: Payer: Self-pay | Admitting: Vascular Surgery

## 2020-07-17 ENCOUNTER — Other Ambulatory Visit: Payer: Self-pay | Admitting: Cardiology

## 2020-07-27 ENCOUNTER — Encounter (HOSPITAL_COMMUNITY): Payer: 59

## 2020-07-27 ENCOUNTER — Ambulatory Visit: Payer: 59 | Admitting: Vascular Surgery

## 2020-08-08 ENCOUNTER — Encounter: Payer: Self-pay | Admitting: Gastroenterology

## 2020-08-08 ENCOUNTER — Ambulatory Visit (INDEPENDENT_AMBULATORY_CARE_PROVIDER_SITE_OTHER): Payer: 59 | Admitting: Gastroenterology

## 2020-08-08 VITALS — BP 126/68 | HR 68 | Ht 63.0 in | Wt 177.2 lb

## 2020-08-08 DIAGNOSIS — D509 Iron deficiency anemia, unspecified: Secondary | ICD-10-CM | POA: Diagnosis not present

## 2020-08-08 DIAGNOSIS — K219 Gastro-esophageal reflux disease without esophagitis: Secondary | ICD-10-CM | POA: Diagnosis not present

## 2020-08-08 DIAGNOSIS — K8689 Other specified diseases of pancreas: Secondary | ICD-10-CM

## 2020-08-08 NOTE — Progress Notes (Signed)
    History of Present Illness: This is a 74 year old female returning for follow-up.  She relates her nausea and upper abdominal pain have resolved.  She feels Lomotil was causing nausea.  Increase pantoprazole to 40 mg twice daily as controlled her upper abdominal pain.  She notes a decreased appetite however she has had a slight weight gain.  Current Medications, Allergies, Past Medical History, Past Surgical History, Family History and Social History were reviewed in [employer] record.   Physical Exam: General: Well developed, well nourished, no acute distress Head: Normocephalic and atraumatic Eyes:  sclerae anicteric, EOMI Ears: Normal auditory acuity Mouth: Not examined, mask on during Covid-19 pandemic Lungs: Clear throughout to auscultation Heart: Regular rate and rhythm; no murmurs, rubs or bruits Abdomen: Soft, non tender and non distended. Ileostomy. No masses, hepatosplenomegaly or hernias noted. Normal Bowel sounds Rectal: Not done Musculoskeletal: Symmetrical with no gross deformities  Pulses:  Normal pulses noted Extremities: No clubbing, cyanosis, edema or deformities noted Neurological: Alert oriented x 4, grossly nonfocal Psychological:  Alert and cooperative. Normal mood and affect   Assessment and Recommendations:  1. GERD, moderate sized hiatal hernia.  Closely follow antireflux measures long-term.  Continue pantoprazole 40 mg twice daily long-term. GI REV in 1 year.   2. Pancreatic insufficiency. Continue Zenpep 2 capsules with meals 3 times daily.  Follow fat modified, carb modified diet long-term. GI REV in 1 year.   3. IDA. Continue Poly-Iron 150 daily.  Follow-up with Dr. Sharlett Iles.  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   5. Parastomal hernia. Periumbilical hernias. Follow up with General Surgery.   6. Personal history of tubular  adenomas.  Surveillance flexible sigmoidoscopy recommended in March 2023.

## 2020-08-08 NOTE — Patient Instructions (Signed)
Thank you for choosing me and [facility].  Malcolm T. Stark, Jr., MD., FACG  

## 2020-08-25 ENCOUNTER — Other Ambulatory Visit: Payer: Self-pay | Admitting: Internal Medicine

## 2020-08-25 ENCOUNTER — Other Ambulatory Visit (HOSPITAL_COMMUNITY): Payer: Self-pay | Admitting: Internal Medicine

## 2020-08-25 ENCOUNTER — Other Ambulatory Visit: Payer: Self-pay | Admitting: Physician Assistant

## 2020-08-25 DIAGNOSIS — R1313 Dysphagia, pharyngeal phase: Secondary | ICD-10-CM

## 2020-08-30 ENCOUNTER — Other Ambulatory Visit: Payer: Self-pay

## 2020-08-30 ENCOUNTER — Ambulatory Visit (HOSPITAL_COMMUNITY)
Admission: RE | Admit: 2020-08-30 | Discharge: 2020-08-30 | Disposition: A | Discharge status: Home / Self Care | Payer: 59 | Source: Ambulatory Visit | Attending: Internal Medicine | Admitting: Internal Medicine

## 2020-08-30 ENCOUNTER — Other Ambulatory Visit (HOSPITAL_COMMUNITY): Payer: Self-pay | Admitting: Internal Medicine

## 2020-08-30 DIAGNOSIS — R1313 Dysphagia, pharyngeal phase: Secondary | ICD-10-CM | POA: Diagnosis present

## 2020-10-11 ENCOUNTER — Other Ambulatory Visit: Payer: Self-pay | Admitting: Neurology

## 2020-11-15 ENCOUNTER — Inpatient Hospital Stay (HOSPITAL_COMMUNITY)
Admission: EM | Admit: 2020-11-15 | Discharge: 2020-11-23 | DRG: 682 | Disposition: A | Discharge status: Home Health | Payer: 59 | Attending: Internal Medicine | Admitting: Internal Medicine

## 2020-11-15 ENCOUNTER — Encounter (HOSPITAL_COMMUNITY): Payer: Self-pay | Admitting: Emergency Medicine

## 2020-11-15 ENCOUNTER — Other Ambulatory Visit: Payer: Self-pay

## 2020-11-15 DIAGNOSIS — E1151 Type 2 diabetes mellitus with diabetic peripheral angiopathy without gangrene: Secondary | ICD-10-CM | POA: Diagnosis present

## 2020-11-15 DIAGNOSIS — E872 Acidosis, unspecified: Secondary | ICD-10-CM | POA: Diagnosis present

## 2020-11-15 DIAGNOSIS — K219 Gastro-esophageal reflux disease without esophagitis: Secondary | ICD-10-CM | POA: Diagnosis present

## 2020-11-15 DIAGNOSIS — N179 Acute kidney failure, unspecified: Secondary | ICD-10-CM | POA: Diagnosis present

## 2020-11-15 DIAGNOSIS — N1832 Chronic kidney disease, stage 3b: Secondary | ICD-10-CM | POA: Diagnosis present

## 2020-11-15 DIAGNOSIS — D509 Iron deficiency anemia, unspecified: Secondary | ICD-10-CM | POA: Diagnosis present

## 2020-11-15 DIAGNOSIS — Z885 Allergy status to narcotic agent status: Secondary | ICD-10-CM

## 2020-11-15 DIAGNOSIS — Z90710 Acquired absence of both cervix and uterus: Secondary | ICD-10-CM

## 2020-11-15 DIAGNOSIS — I130 Hypertensive heart and chronic kidney disease with heart failure and stage 1 through stage 4 chronic kidney disease, or unspecified chronic kidney disease: Secondary | ICD-10-CM | POA: Diagnosis present

## 2020-11-15 DIAGNOSIS — U071 COVID-19: Secondary | ICD-10-CM | POA: Diagnosis present

## 2020-11-15 DIAGNOSIS — K435 Parastomal hernia without obstruction or  gangrene: Secondary | ICD-10-CM | POA: Diagnosis present

## 2020-11-15 DIAGNOSIS — G40909 Epilepsy, unspecified, not intractable, without status epilepticus: Secondary | ICD-10-CM | POA: Diagnosis present

## 2020-11-15 DIAGNOSIS — Z91048 Other nonmedicinal substance allergy status: Secondary | ICD-10-CM

## 2020-11-15 DIAGNOSIS — I5031 Acute diastolic (congestive) heart failure: Secondary | ICD-10-CM | POA: Diagnosis present

## 2020-11-15 DIAGNOSIS — R112 Nausea with vomiting, unspecified: Secondary | ICD-10-CM | POA: Diagnosis present

## 2020-11-15 DIAGNOSIS — M1990 Unspecified osteoarthritis, unspecified site: Secondary | ICD-10-CM | POA: Diagnosis present

## 2020-11-15 DIAGNOSIS — Z8249 Family history of ischemic heart disease and other diseases of the circulatory system: Secondary | ICD-10-CM

## 2020-11-15 DIAGNOSIS — E875 Hyperkalemia: Secondary | ICD-10-CM | POA: Diagnosis present

## 2020-11-15 DIAGNOSIS — Z87891 Personal history of nicotine dependence: Secondary | ICD-10-CM

## 2020-11-15 DIAGNOSIS — K55049 Acute infarction of large intestine, extent unspecified: Secondary | ICD-10-CM | POA: Diagnosis present

## 2020-11-15 DIAGNOSIS — J189 Pneumonia, unspecified organism: Secondary | ICD-10-CM

## 2020-11-15 DIAGNOSIS — F419 Anxiety disorder, unspecified: Secondary | ICD-10-CM | POA: Diagnosis present

## 2020-11-15 DIAGNOSIS — N189 Chronic kidney disease, unspecified: Secondary | ICD-10-CM | POA: Diagnosis present

## 2020-11-15 DIAGNOSIS — E860 Dehydration: Secondary | ICD-10-CM | POA: Diagnosis present

## 2020-11-15 DIAGNOSIS — Z79899 Other long term (current) drug therapy: Secondary | ICD-10-CM

## 2020-11-15 DIAGNOSIS — Z9049 Acquired absence of other specified parts of digestive tract: Secondary | ICD-10-CM

## 2020-11-15 DIAGNOSIS — Z6830 Body mass index (BMI) 30.0-30.9, adult: Secondary | ICD-10-CM

## 2020-11-15 DIAGNOSIS — Z823 Family history of stroke: Secondary | ICD-10-CM

## 2020-11-15 DIAGNOSIS — Z95828 Presence of other vascular implants and grafts: Secondary | ICD-10-CM

## 2020-11-15 DIAGNOSIS — E785 Hyperlipidemia, unspecified: Secondary | ICD-10-CM | POA: Diagnosis present

## 2020-11-15 DIAGNOSIS — I2510 Atherosclerotic heart disease of native coronary artery without angina pectoris: Secondary | ICD-10-CM | POA: Diagnosis present

## 2020-11-15 DIAGNOSIS — E669 Obesity, unspecified: Secondary | ICD-10-CM | POA: Diagnosis present

## 2020-11-15 DIAGNOSIS — R0602 Shortness of breath: Secondary | ICD-10-CM

## 2020-11-15 DIAGNOSIS — Z8673 Personal history of transient ischemic attack (TIA), and cerebral infarction without residual deficits: Secondary | ICD-10-CM

## 2020-11-15 DIAGNOSIS — Z91041 Radiographic dye allergy status: Secondary | ICD-10-CM

## 2020-11-15 DIAGNOSIS — G4733 Obstructive sleep apnea (adult) (pediatric): Secondary | ICD-10-CM | POA: Diagnosis present

## 2020-11-15 DIAGNOSIS — Z933 Colostomy status: Secondary | ICD-10-CM

## 2020-11-15 DIAGNOSIS — E1122 Type 2 diabetes mellitus with diabetic chronic kidney disease: Secondary | ICD-10-CM | POA: Diagnosis present

## 2020-11-15 DIAGNOSIS — Z7902 Long term (current) use of antithrombotics/antiplatelets: Secondary | ICD-10-CM

## 2020-11-15 DIAGNOSIS — K760 Fatty (change of) liver, not elsewhere classified: Secondary | ICD-10-CM | POA: Diagnosis present

## 2020-11-15 DIAGNOSIS — K861 Other chronic pancreatitis: Secondary | ICD-10-CM | POA: Diagnosis present

## 2020-11-15 DIAGNOSIS — Z79891 Long term (current) use of opiate analgesic: Secondary | ICD-10-CM

## 2020-11-15 DIAGNOSIS — E871 Hypo-osmolality and hyponatremia: Secondary | ICD-10-CM | POA: Diagnosis present

## 2020-11-15 LAB — COMPREHENSIVE METABOLIC PANEL
ALT: 24 U/L (ref 0–44)
AST: 43 U/L — ABNORMAL HIGH (ref 15–41)
Albumin: 2.9 g/dL — ABNORMAL LOW (ref 3.5–5.0)
Alkaline Phosphatase: 139 U/L — ABNORMAL HIGH (ref 38–126)
Anion gap: 13 (ref 5–15)
BUN: 47 mg/dL — ABNORMAL HIGH (ref 8–23)
CO2: 13 mmol/L — ABNORMAL LOW (ref 22–32)
Calcium: 8.8 mg/dL — ABNORMAL LOW (ref 8.9–10.3)
Chloride: 98 mmol/L (ref 98–111)
Creatinine, Ser: 3.29 mg/dL — ABNORMAL HIGH (ref 0.44–1.00)
GFR, Estimated: 14 mL/min — ABNORMAL LOW (ref >60)
Glucose, Bld: 126 mg/dL — ABNORMAL HIGH (ref 70–99)
Potassium: 6 mmol/L — ABNORMAL HIGH (ref 3.5–5.1)
Sodium: 124 mmol/L — ABNORMAL LOW (ref 135–145)
Total Bilirubin: 0.9 mg/dL (ref 0.3–1.2)
Total Protein: 5.8 g/dL — ABNORMAL LOW (ref 6.5–8.1)

## 2020-11-15 LAB — CBC
HCT: 33.8 % — ABNORMAL LOW (ref 36.0–46.0)
Hemoglobin: 10.7 g/dL — ABNORMAL LOW (ref 12.0–15.0)
MCH: 25.4 pg — ABNORMAL LOW (ref 26.0–34.0)
MCHC: 31.7 g/dL (ref 30.0–36.0)
MCV: 80.1 fL (ref 80.0–100.0)
Platelets: 288 10*3/uL (ref 150–400)
RBC: 4.22 MIL/uL (ref 3.87–5.11)
RDW: 17.8 % — ABNORMAL HIGH (ref 11.5–15.5)
WBC: 11.8 10*3/uL — ABNORMAL HIGH (ref 4.0–10.5)
nRBC: 0 % (ref 0.0–0.2)

## 2020-11-15 LAB — TROPONIN I (HIGH SENSITIVITY)
Troponin I (High Sensitivity): 21 ng/L — ABNORMAL HIGH (ref <18)
Troponin I (High Sensitivity): 24 ng/L — ABNORMAL HIGH (ref <18)

## 2020-11-15 LAB — LIPASE, BLOOD: Lipase: 104 U/L — ABNORMAL HIGH (ref 11–51)

## 2020-11-15 MED ORDER — ONDANSETRON 4 MG PO TBDP
4.0000 mg | ORAL_TABLET | Freq: Once | ORAL | Status: AC | PRN
  Administered 2020-11-15: 4 mg via ORAL
  Filled 2020-11-15: qty 1

## 2020-11-15 NOTE — ED Triage Notes (Signed)
Pt to triage via [facility] from home.  Reports intermittent nausea and vomiting x 10 days.  Worse over the last few days.  HR 140-150 on EMS arrival.  NS 500cc.  HR down to 120s after fluid bolus.  Not able to keep daily medications down.  Zofran 4mg  IV.  20g L AC.

## 2020-11-16 ENCOUNTER — Emergency Department (HOSPITAL_COMMUNITY): Payer: 59

## 2020-11-16 ENCOUNTER — Inpatient Hospital Stay (HOSPITAL_COMMUNITY): Payer: 59

## 2020-11-16 DIAGNOSIS — I130 Hypertensive heart and chronic kidney disease with heart failure and stage 1 through stage 4 chronic kidney disease, or unspecified chronic kidney disease: Secondary | ICD-10-CM | POA: Diagnosis present

## 2020-11-16 DIAGNOSIS — N179 Acute kidney failure, unspecified: Secondary | ICD-10-CM | POA: Diagnosis present

## 2020-11-16 DIAGNOSIS — N183 Chronic kidney disease, stage 3 unspecified: Secondary | ICD-10-CM

## 2020-11-16 DIAGNOSIS — E875 Hyperkalemia: Secondary | ICD-10-CM | POA: Diagnosis present

## 2020-11-16 DIAGNOSIS — I2510 Atherosclerotic heart disease of native coronary artery without angina pectoris: Secondary | ICD-10-CM | POA: Diagnosis present

## 2020-11-16 DIAGNOSIS — E1151 Type 2 diabetes mellitus with diabetic peripheral angiopathy without gangrene: Secondary | ICD-10-CM

## 2020-11-16 DIAGNOSIS — K760 Fatty (change of) liver, not elsewhere classified: Secondary | ICD-10-CM | POA: Diagnosis present

## 2020-11-16 DIAGNOSIS — D508 Other iron deficiency anemias: Secondary | ICD-10-CM | POA: Diagnosis not present

## 2020-11-16 DIAGNOSIS — D509 Iron deficiency anemia, unspecified: Secondary | ICD-10-CM | POA: Diagnosis present

## 2020-11-16 DIAGNOSIS — G40909 Epilepsy, unspecified, not intractable, without status epilepticus: Secondary | ICD-10-CM | POA: Diagnosis present

## 2020-11-16 DIAGNOSIS — Z933 Colostomy status: Secondary | ICD-10-CM | POA: Diagnosis not present

## 2020-11-16 DIAGNOSIS — I5031 Acute diastolic (congestive) heart failure: Secondary | ICD-10-CM | POA: Diagnosis present

## 2020-11-16 DIAGNOSIS — E669 Obesity, unspecified: Secondary | ICD-10-CM | POA: Diagnosis present

## 2020-11-16 DIAGNOSIS — F419 Anxiety disorder, unspecified: Secondary | ICD-10-CM | POA: Diagnosis present

## 2020-11-16 DIAGNOSIS — R112 Nausea with vomiting, unspecified: Secondary | ICD-10-CM | POA: Diagnosis present

## 2020-11-16 DIAGNOSIS — E871 Hypo-osmolality and hyponatremia: Secondary | ICD-10-CM | POA: Diagnosis present

## 2020-11-16 DIAGNOSIS — N1832 Chronic kidney disease, stage 3b: Secondary | ICD-10-CM | POA: Diagnosis present

## 2020-11-16 DIAGNOSIS — R7989 Other specified abnormal findings of blood chemistry: Secondary | ICD-10-CM | POA: Diagnosis not present

## 2020-11-16 DIAGNOSIS — N189 Chronic kidney disease, unspecified: Secondary | ICD-10-CM | POA: Diagnosis not present

## 2020-11-16 DIAGNOSIS — K219 Gastro-esophageal reflux disease without esophagitis: Secondary | ICD-10-CM | POA: Diagnosis present

## 2020-11-16 DIAGNOSIS — E785 Hyperlipidemia, unspecified: Secondary | ICD-10-CM | POA: Diagnosis present

## 2020-11-16 DIAGNOSIS — U071 COVID-19: Secondary | ICD-10-CM | POA: Diagnosis present

## 2020-11-16 DIAGNOSIS — K435 Parastomal hernia without obstruction or  gangrene: Secondary | ICD-10-CM | POA: Diagnosis present

## 2020-11-16 DIAGNOSIS — E1122 Type 2 diabetes mellitus with diabetic chronic kidney disease: Secondary | ICD-10-CM | POA: Diagnosis present

## 2020-11-16 DIAGNOSIS — E872 Acidosis, unspecified: Secondary | ICD-10-CM | POA: Diagnosis present

## 2020-11-16 DIAGNOSIS — G4733 Obstructive sleep apnea (adult) (pediatric): Secondary | ICD-10-CM | POA: Diagnosis present

## 2020-11-16 DIAGNOSIS — K861 Other chronic pancreatitis: Secondary | ICD-10-CM | POA: Diagnosis present

## 2020-11-16 DIAGNOSIS — E860 Dehydration: Secondary | ICD-10-CM | POA: Diagnosis present

## 2020-11-16 DIAGNOSIS — M1990 Unspecified osteoarthritis, unspecified site: Secondary | ICD-10-CM | POA: Diagnosis present

## 2020-11-16 LAB — URINALYSIS, ROUTINE W REFLEX MICROSCOPIC
Bilirubin Urine: NEGATIVE
Glucose, UA: 50 mg/dL — AB
Ketones, ur: NEGATIVE mg/dL
Nitrite: NEGATIVE
Protein, ur: NEGATIVE mg/dL
Specific Gravity, Urine: 1.011 (ref 1.005–1.030)
pH: 5 (ref 5.0–8.0)

## 2020-11-16 LAB — BASIC METABOLIC PANEL
Anion gap: 15 (ref 5–15)
Anion gap: 12 (ref 5–15)
Anion gap: 11 (ref 5–15)
BUN: 56 mg/dL — ABNORMAL HIGH (ref 8–23)
BUN: 52 mg/dL — ABNORMAL HIGH (ref 8–23)
BUN: 46 mg/dL — ABNORMAL HIGH (ref 8–23)
CO2: 12 mmol/L — ABNORMAL LOW (ref 22–32)
CO2: 14 mmol/L — ABNORMAL LOW (ref 22–32)
CO2: 18 mmol/L — ABNORMAL LOW (ref 22–32)
Calcium: 9.4 mg/dL (ref 8.9–10.3)
Calcium: 9.4 mg/dL (ref 8.9–10.3)
Calcium: 8.5 mg/dL — ABNORMAL LOW (ref 8.9–10.3)
Chloride: 97 mmol/L — ABNORMAL LOW (ref 98–111)
Chloride: 102 mmol/L (ref 98–111)
Chloride: 98 mmol/L (ref 98–111)
Creatinine, Ser: 4.26 mg/dL — ABNORMAL HIGH (ref 0.44–1.00)
Creatinine, Ser: 3.77 mg/dL — ABNORMAL HIGH (ref 0.44–1.00)
Creatinine, Ser: 3.41 mg/dL — ABNORMAL HIGH (ref 0.44–1.00)
GFR, Estimated: 10 mL/min — ABNORMAL LOW (ref >60)
GFR, Estimated: 12 mL/min — ABNORMAL LOW (ref >60)
GFR, Estimated: 14 mL/min — ABNORMAL LOW (ref >60)
Glucose, Bld: 131 mg/dL — ABNORMAL HIGH (ref 70–99)
Glucose, Bld: 51 mg/dL — ABNORMAL LOW (ref 70–99)
Glucose, Bld: 92 mg/dL (ref 70–99)
Potassium: 5.9 mmol/L — ABNORMAL HIGH (ref 3.5–5.1)
Potassium: 5.9 mmol/L — ABNORMAL HIGH (ref 3.5–5.1)
Potassium: 5.3 mmol/L — ABNORMAL HIGH (ref 3.5–5.1)
Sodium: 124 mmol/L — ABNORMAL LOW (ref 135–145)
Sodium: 128 mmol/L — ABNORMAL LOW (ref 135–145)
Sodium: 127 mmol/L — ABNORMAL LOW (ref 135–145)

## 2020-11-16 LAB — CBC
HCT: 33.4 % — ABNORMAL LOW (ref 36.0–46.0)
Hemoglobin: 10.5 g/dL — ABNORMAL LOW (ref 12.0–15.0)
MCH: 25.2 pg — ABNORMAL LOW (ref 26.0–34.0)
MCHC: 31.4 g/dL (ref 30.0–36.0)
MCV: 80.3 fL (ref 80.0–100.0)
Platelets: 268 10*3/uL (ref 150–400)
RBC: 4.16 MIL/uL (ref 3.87–5.11)
RDW: 17.9 % — ABNORMAL HIGH (ref 11.5–15.5)
WBC: 19.5 10*3/uL — ABNORMAL HIGH (ref 4.0–10.5)
nRBC: 0 % (ref 0.0–0.2)

## 2020-11-16 LAB — SODIUM, URINE, RANDOM: Sodium, Ur: 10 mmol/L

## 2020-11-16 LAB — CREATININE, URINE, RANDOM: Creatinine, Urine: 82.46 mg/dL

## 2020-11-16 LAB — LACTIC ACID, PLASMA: Lactic Acid, Venous: 5.5 mmol/L (ref 0.5–1.9)

## 2020-11-16 LAB — GLUCOSE, CAPILLARY
Glucose-Capillary: 88 mg/dL (ref 70–99)
Glucose-Capillary: 156 mg/dL — ABNORMAL HIGH (ref 70–99)
Glucose-Capillary: 95 mg/dL (ref 70–99)

## 2020-11-16 LAB — CBG MONITORING, ED
Glucose-Capillary: 73 mg/dL (ref 70–99)
Glucose-Capillary: 112 mg/dL — ABNORMAL HIGH (ref 70–99)

## 2020-11-16 LAB — SARS CORONAVIRUS 2 (TAT 6-24 HRS): SARS Coronavirus 2: POSITIVE — AB

## 2020-11-16 MED ORDER — INSULIN ASPART 100 UNIT/ML IV SOLN
5.0000 [IU] | Freq: Once | INTRAVENOUS | Status: AC
  Administered 2020-11-16: 5 [IU] via INTRAVENOUS

## 2020-11-16 MED ORDER — STERILE WATER FOR INJECTION IV SOLN
INTRAVENOUS | Status: DC
  Filled 2020-11-16: qty 850

## 2020-11-16 MED ORDER — DEXTROSE 50 % IV SOLN
1.0000 | Freq: Once | INTRAVENOUS | Status: AC
  Administered 2020-11-16: 50 mL via INTRAVENOUS
  Filled 2020-11-16: qty 50

## 2020-11-16 MED ORDER — CLOPIDOGREL BISULFATE 75 MG PO TABS
75.0000 mg | ORAL_TABLET | Freq: Every day | ORAL | Status: DC
  Administered 2020-11-16 – 2020-11-23 (×8): 75 mg via ORAL
  Filled 2020-11-16 (×8): qty 1

## 2020-11-16 MED ORDER — PANCRELIPASE (LIP-PROT-AMYL) 36000-114000 UNITS PO CPEP
36000.0000 [IU] | ORAL_CAPSULE | Freq: Every day | ORAL | Status: DC
  Administered 2020-11-16 – 2020-11-23 (×8): 36000 [IU] via ORAL
  Filled 2020-11-16 (×8): qty 1

## 2020-11-16 MED ORDER — METOPROLOL SUCCINATE ER 50 MG PO TB24
50.0000 mg | ORAL_TABLET | Freq: Every day | ORAL | Status: DC
  Administered 2020-11-16 – 2020-11-17 (×2): 50 mg via ORAL
  Filled 2020-11-16: qty 2
  Filled 2020-11-16: qty 1

## 2020-11-16 MED ORDER — SODIUM CHLORIDE 0.9 % IV BOLUS
1000.0000 mL | Freq: Once | INTRAVENOUS | Status: AC
  Administered 2020-11-16: 1000 mL via INTRAVENOUS

## 2020-11-16 MED ORDER — AMLODIPINE BESYLATE 10 MG PO TABS
10.0000 mg | ORAL_TABLET | Freq: Every day | ORAL | Status: DC
  Administered 2020-11-16 – 2020-11-19 (×4): 10 mg via ORAL
  Filled 2020-11-16: qty 1
  Filled 2020-11-16: qty 2
  Filled 2020-11-16 (×2): qty 1

## 2020-11-16 MED ORDER — ARIPIPRAZOLE 5 MG PO TABS
5.0000 mg | ORAL_TABLET | Freq: Every day | ORAL | Status: DC
  Administered 2020-11-16 – 2020-11-19 (×4): 5 mg via ORAL
  Filled 2020-11-16 (×4): qty 1

## 2020-11-16 MED ORDER — TRAMADOL HCL 50 MG PO TABS
50.0000 mg | ORAL_TABLET | Freq: Three times a day (TID) | ORAL | Status: DC | PRN
Start: 2020-11-16 — End: 2020-11-23
  Administered 2020-11-16 – 2020-11-22 (×5): 50 mg via ORAL
  Filled 2020-11-16 (×5): qty 1

## 2020-11-16 MED ORDER — SODIUM BICARBONATE 650 MG PO TABS
650.0000 mg | ORAL_TABLET | Freq: Two times a day (BID) | ORAL | Status: DC
  Administered 2020-11-16 – 2020-11-18 (×5): 650 mg via ORAL
  Filled 2020-11-16 (×5): qty 1

## 2020-11-16 MED ORDER — ACETAMINOPHEN 650 MG RE SUPP: 650.0000 mg | Freq: Four times a day (QID) | RECTAL | Status: DC | PRN

## 2020-11-16 MED ORDER — HEPARIN SODIUM (PORCINE) 5000 UNIT/ML IJ SOLN
5000.0000 [IU] | Freq: Three times a day (TID) | INTRAMUSCULAR | Status: DC
  Administered 2020-11-16 – 2020-11-19 (×10): 5000 [IU] via SUBCUTANEOUS
  Filled 2020-11-16 (×8): qty 1

## 2020-11-16 MED ORDER — ONDANSETRON HCL 4 MG/2ML IJ SOLN
4.0000 mg | Freq: Once | INTRAMUSCULAR | Status: AC
  Administered 2020-11-16: 4 mg via INTRAVENOUS
  Filled 2020-11-16: qty 2

## 2020-11-16 MED ORDER — ONDANSETRON HCL 4 MG PO TABS: 4.0000 mg | ORAL_TABLET | Freq: Four times a day (QID) | ORAL | Status: DC | PRN

## 2020-11-16 MED ORDER — CALCIUM GLUCONATE-NACL 1-0.675 GM/50ML-% IV SOLN
1.0000 g | Freq: Once | INTRAVENOUS | Status: AC
  Administered 2020-11-16: 1000 mg via INTRAVENOUS
  Filled 2020-11-16: qty 50

## 2020-11-16 MED ORDER — ONDANSETRON HCL 4 MG/2ML IJ SOLN
4.0000 mg | Freq: Four times a day (QID) | INTRAMUSCULAR | Status: DC | PRN
  Administered 2020-11-16: 4 mg via INTRAVENOUS
  Filled 2020-11-16: qty 2

## 2020-11-16 MED ORDER — ACETAMINOPHEN 325 MG PO TABS
650.0000 mg | ORAL_TABLET | Freq: Four times a day (QID) | ORAL | Status: DC | PRN
  Administered 2020-11-21: 650 mg via ORAL
  Filled 2020-11-16: qty 2

## 2020-11-16 MED ORDER — SODIUM POLYSTYRENE SULFONATE 15 GM/60ML PO SUSP
30.0000 g | Freq: Two times a day (BID) | ORAL | Status: DC
  Administered 2020-11-16 (×2): 30 g via ORAL
  Filled 2020-11-16 (×2): qty 120

## 2020-11-16 MED ORDER — SODIUM CHLORIDE 0.9 % IV SOLN: INTRAVENOUS | Status: DC

## 2020-11-16 MED ORDER — DIPHENOXYLATE-ATROPINE 2.5-0.025 MG PO TABS
1.0000 | ORAL_TABLET | Freq: Three times a day (TID) | ORAL | Status: DC
  Administered 2020-11-16 – 2020-11-23 (×22): 1 via ORAL
  Filled 2020-11-16 (×22): qty 1

## 2020-11-16 MED ORDER — LEVETIRACETAM 500 MG PO TABS
500.0000 mg | ORAL_TABLET | Freq: Two times a day (BID) | ORAL | Status: DC
  Administered 2020-11-16 – 2020-11-17 (×3): 500 mg via ORAL
  Filled 2020-11-16 (×3): qty 1

## 2020-11-16 MED ORDER — LABETALOL HCL 5 MG/ML IV SOLN
10.0000 mg | INTRAVENOUS | Status: DC | PRN
  Administered 2020-11-16 (×2): 10 mg via INTRAVENOUS
  Filled 2020-11-16 (×2): qty 4

## 2020-11-16 MED ORDER — LOPERAMIDE HCL 2 MG PO CAPS: 2.0000 mg | ORAL_CAPSULE | Freq: Every day | ORAL | Status: DC | PRN

## 2020-11-16 MED ORDER — PANTOPRAZOLE SODIUM 40 MG PO TBEC
40.0000 mg | DELAYED_RELEASE_TABLET | Freq: Two times a day (BID) | ORAL | Status: DC
  Administered 2020-11-16 – 2020-11-23 (×15): 40 mg via ORAL
  Filled 2020-11-16 (×15): qty 1

## 2020-11-16 MED ORDER — CYCLOBENZAPRINE HCL 10 MG PO TABS
10.0000 mg | ORAL_TABLET | Freq: Three times a day (TID) | ORAL | Status: DC | PRN
  Filled 2020-11-16: qty 1

## 2020-11-16 NOTE — Progress Notes (Signed)
NEW ADMISSION NOTE New Admission Note:   Arrival Method: wheelchair Mental Orientation: A & O X4 Telemetry:5M04 Assessment: Completed Skin:intact bruise on left arm, abrasion upper back  IV: LAC  Pain: 0/10 Tubes: colostomy RUQ Safety Measures: Safety Fall Prevention Plan has been given, discussed and signed Admission: Completed [facility] Orientation: Patient has been orientated to the room, unit and staff.  Family: none at bedside  Orders have been reviewed and implemented. Will continue to monitor the patient. Call light has been placed within reach and bed alarm has been activated.   Iori Gigante S Eustolia Drennen, RN

## 2020-11-16 NOTE — H&P (Signed)
History and Physical    [name] EUM:[id number] DOB: Jul 07, 1946 DOA: 11/15/2020  PCP: Leanna Battles, MD  Patient coming from: Home  I have personally briefly reviewed patient's old medical records in [place]  Chief Complaint: N/V  HPI: [name] is a 75 y.o. female with medical history significant of HTN, colostomy, seizure disorder, CKD 3.  Pt presents to ED with 10 day h/o intermittent N/V.  Worse over past few days.  Unable to keep liquids or PO meds down.  Normal ostomy output however.  Some abd discomfort but no severe pain.  Symptoms severe, worsening, persistent.  No CP, no SOB.   ED Course: Sodium 124, K 6.0, Creat 3.2 up from a 1.5 baseline, bicarb 13 but no anion gap.  2L NS bolus ordered.  CT abd/pelvis: no obstruction, has parastomal hernia.   Review of Systems: As per HPI, otherwise all review of systems negative.  Past Medical History:  Diagnosis Date  . Allergy   . Anemia    past hx per pt   . Anxiety   . Arthritis    back   . Arthropathy, unspecified, site unspecified   . Blind loop syndrome   . Carotid artery disease ([place])    documented occlusion of left ICA  . Cataract    removed both eyes   . Clotting disorder (HCC)    Hx Clot in leg per pt   . Colostomy present ([place])   . Coronary artery disease    a. known 60-70% mid-LAD stenosis with caths in 1999, 2002, 2006, and 2012 showing stable anatomy b. low-risk NST in 10/2015  . Depression   . Diabetes insipidus ([place])   . Diabetes mellitus   . Diverticulosis   . Dizziness    chronic  . Dyslipidemia   . Dyspnea    with exertion  . Fatty liver   . GERD (gastroesophageal reflux disease)    hiatial hernia  . Heart murmur   . Hepatomegaly   . Hiatal hernia   . HTN (hypertension)   . Hyperlipidemia   . Hypertension   . Irritable bowel syndrome   . Melanosis coli   . Metatarsal fracture right  . Normal nuclear stress test    nml 01/03/10;06/19/07  . Other, mixed, or  unspecified nondependent drug abuse, unspecified   . Pancreatitis, chronic ([place])   . Peripheral vascular disease ([place]) 02/20/10   s/p stent of left SFA  . Seizure ([place]) 11/04/2018   last 1 yr 2020 per pt unsure month   . Sleep apnea    no cpap   . Stroke ([place])   . Thyroid nodule   . Unspecified essential hypertension   . Vitamin B12 deficiency     Past Surgical History:  Procedure Laterality Date  . ABDOMINAL HYSTERECTOMY     partial  . AUGMENTATION MAMMAPLASTY     bilateral 1976 retro   . BREAST ENHANCEMENT SURGERY    . CARDIAC CATHETERIZATION  07/07/98;08/31/01;03/04/05   60 -70% LAD  . CATARACT EXTRACTION    . COLON RESECTION N/A 10/14/2017   Procedure: EXPLORATORY LAPAROTOMY, EXTENDED RIGHT COLECTOMY; APPLICATION OF ABDOMINAL VACUUM DRESSING;  Surgeon: Jovita Kussmaul, MD;  Location: [facility];  Service: General;  Laterality: N/A;  . COLONOSCOPY    . ENDOBRONCHIAL ULTRASOUND Bilateral 02/24/2017   Procedure: ENDOBRONCHIAL ULTRASOUND;  Surgeon: Collene Gobble, MD;  Location: [facility];  Service: Cardiopulmonary;  Laterality: Bilateral;  . EYE SURGERY Left Nov. 2016  Cataract  . EYE SURGERY Right 2001   Cataract  . FEMORAL ARTERY STENT     Left leg  . LAPAROTOMY N/A 10/16/2017   Procedure: EXPLORATORY LAPAROTOMY, PARTIAL OMENTECTOMY, RESECTION ISCHEMIC ILEUM 703JK, APPLICATION OF VAC ABDOMINAL DRESSING;  Surgeon: Armandina Gemma, MD;  Location: [facility];  Service: General;  Laterality: N/A;  . LAPAROTOMY N/A 10/18/2017   Procedure: RE-EXPLORATION OF ABDOMEN, ILEOSTOMY CREATION;  Surgeon: Stark Klein, MD;  Location: [facility];  Service: General;  Laterality: N/A;  . LUNG SURGERY  03/2017   Benign polyps removed  . PATELLA REALIGNMENT Left   . POLYPECTOMY    . SIGMOIDOSCOPY    . TONSILLECTOMY    . TOTAL ABDOMINAL HYSTERECTOMY    . WRIST SURGERY     right     reports that she quit smoking about 10 years ago. Her smoking use included cigarettes. She has a 50.00 pack-year smoking  history. She has never used smokeless tobacco. She reports that she does not drink alcohol and does not use drugs.  Allergies  Allergen Reactions  . Adhesive [Tape] Other (See Comments)    "Took off my skin"  . Codeine Other (See Comments)    Altered mental state   . Iodinated Diagnostic Agents Rash    Family History  Problem Relation Age of Onset  . Hypertension Mother   . Osteoarthritis Mother   . Pulmonary embolism Mother   . Hypertension Father        aneurysm  . Stroke Father   . Stroke Other   . Colon cancer Paternal Grandfather   . Breast cancer Neg Hx   . Colon polyps Neg Hx   . Esophageal cancer Neg Hx   . Rectal cancer Neg Hx   . Stomach cancer Neg Hx      Prior to Admission medications   Medication Sig Start Date End Date Taking? Authorizing Provider  acetaminophen (TYLENOL) 325 MG tablet Take 650 mg by mouth every 6 (six) hours as needed for headache (pain).   Yes [provider]  traMADol (ULTRAM) 50 MG tablet Take 50 mg by mouth 3 (three) times daily as needed for moderate pain. 06/01/20  Yes [provider]  amLODipine (NORVASC) 10 MG tablet Place 1 tablet (10 mg total) into feeding tube daily. 11/06/17   Florencia Reasons, MD  ARIPiprazole (ABILIFY) 5 MG tablet Take 5 mg by mouth daily. 01/17/20   [provider]  atorvastatin (LIPITOR) 20 MG tablet Take 20 mg by mouth daily.    [provider]  clopidogrel (PLAVIX) 75 MG tablet Take 75 mg by mouth daily.    [provider]  cyclobenzaprine (FLEXERIL) 10 MG tablet Take 10 mg by mouth 3 (three) times daily as needed for muscle spasms.  09/14/19   [provider]  diphenoxylate-atropine (LOMOTIL) 2.5-0.025 MG tablet Take 1 tablet by mouth 3 (three) times daily. 08/27/19   Domenic Polite, MD  levETIRAcetam (KEPPRA) 500 MG tablet TAKE 1 TABLET BY MOUTH TWICE A DAY 01/20/20   Dohmeier, Asencion Partridge, MD  loperamide (IMODIUM) 2 MG capsule Take 2 mg by mouth daily as needed for diarrhea  or loose stools.     [provider]  losartan (COZAAR) 100 MG tablet TAKE 1/2 A TABLET BY MOUTH ONCE DAILY 07/17/20   [place], Peter M, MD  magnesium oxide (MAG-OX) 400 MG tablet Take 1 tablet by mouth daily. 05/08/20   [provider]  metoprolol succinate (TOPROL-XL) 100 MG 24 hr tablet Take 100  mg by mouth daily. Take with or immediately following a meal.    [provider]  montelukast (SINGULAIR) 10 MG tablet Take 10 mg by mouth daily. 08/09/18   [provider]  nitroGLYCERIN (NITROSTAT) 0.4 MG SL tablet Place 1 tablet (0.4 mg total) under the tongue every 5 (five) minutes as needed for chest pain. 11/08/15   [place], Peter M, MD  Pancrelipase, Lip-Prot-Amyl, (ZENPEP) [phone] units CPEP Take 2 capsules by mouth 3 (three) times daily with meals. Take 2 capsules three times a day with meals for one week and assess 06/27/20   Ladene Artist, MD  pantoprazole (PROTONIX) 40 MG tablet Take 1 tablet (40 mg total) by mouth 2 (two) times daily. 06/27/20   Ladene Artist, MD  POLY-IRON 150 150 MG capsule Take 150 mg by mouth daily. 05/10/20   [provider]  SODIUM FLUORIDE 5000 PPM 1.1 % PSTE SMARTSIG:Sparingly By Mouth Twice Daily 05/16/20   [provider]  venlafaxine XR (EFFEXOR-XR) 150 MG 24 hr capsule Take 150 mg by mouth daily. 05/18/20   [provider]  vitamin B-12 (CYANOCOBALAMIN) 500 MCG tablet Take 500 mcg by mouth daily.    [provider]  Vitamin D, Ergocalciferol, (DRISDOL) 1.25 MG (50000 UT) CAPS capsule Take 50,000 Units by mouth every Wednesday.     [provider]    Physical Exam: Vitals:   11/15/20 1949 11/15/20 2132 11/16/20 0001 11/16/20 0108  BP: (!) 171/137 (!) 162/110 (!) 146/91 106/66  Pulse: (!) 130 87 (!) 107   Resp: 19 18 18  (!) 24  Temp: 98.6 F (37 C)  98.2 F (36.8 C)   TempSrc: Oral  Oral   SpO2: 97% 95% 100%     Constitutional: NAD, calm, comfortable Eyes: PERRL, lids and  conjunctivae normal ENMT: Mucous membranes are moist. Posterior pharynx clear of any exudate or lesions.Normal dentition.  Neck: normal, supple, no masses, no thyromegaly Respiratory: clear to auscultation bilaterally, no wheezing, no crackles. Normal respiratory effort. No accessory muscle use.  Cardiovascular: Regular rate and rhythm, no murmurs / rubs / gallops. No extremity edema. 2+ pedal pulses. No carotid bruits.  Abdomen: no tenderness, no masses palpated. No hepatosplenomegaly. Bowel sounds positive.  Musculoskeletal: no clubbing / cyanosis. No joint deformity upper and lower extremities. Good ROM, no contractures. Normal muscle tone.  Skin: no rashes, lesions, ulcers. No induration Neurologic: CN 2-12 grossly intact. Sensation intact, DTR normal. Strength 5/5 in all 4.  Psychiatric: Normal judgment and insight. Alert and oriented x 3. Normal mood.    Labs on Admission: I have personally reviewed following labs and imaging studies  CBC: Recent Labs  Lab 11/15/20 1153  WBC 11.8*  HGB 10.7*  HCT 33.8*  MCV 80.1  PLT 734   Basic Metabolic Panel: Recent Labs  Lab 11/15/20 1153  NA 124*  K 6.0*  CL 98  CO2 13*  GLUCOSE 126*  BUN 47*  CREATININE 3.29*  CALCIUM 8.8*   GFR: CrCl cannot be calculated (Unknown ideal weight.). Liver Function Tests: Recent Labs  Lab 11/15/20 1153  AST 43*  ALT 24  ALKPHOS 139*  BILITOT 0.9  PROT 5.8*  ALBUMIN 2.9*   Recent Labs  Lab 11/15/20 1153  LIPASE 104*   No results for input(s): AMMONIA in the last 168 hours. Coagulation Profile: No results for input(s): INR, PROTIME in the last 168 hours. Cardiac Enzymes: No results for input(s): CKTOTAL, CKMB, CKMBINDEX, TROPONINI in the last 168 hours. BNP (last  3 results) No results for input(s): PROBNP in the last 8760 hours. HbA1C: No results for input(s): HGBA1C in the last 72 hours. CBG: No results for input(s): GLUCAP in the last 168 hours. Lipid Profile: No results for  input(s): CHOL, HDL, LDLCALC, TRIG, CHOLHDL, LDLDIRECT in the last 72 hours. Thyroid Function Tests: No results for input(s): TSH, T4TOTAL, FREET4, T3FREE, THYROIDAB in the last 72 hours. Anemia Panel: No results for input(s): VITAMINB12, FOLATE, FERRITIN, TIBC, IRON, RETICCTPCT in the last 72 hours. Urine analysis:    Component Value Date/Time   COLORURINE COLORLESS (A) 10/21/2019 2347   APPEARANCEUR CLEAR 10/21/2019 2347   LABSPEC 1.004 (L) 10/21/2019 2347   PHURINE 5.0 10/21/2019 2347   GLUCOSEU NEGATIVE 10/21/2019 2347   HGBUR NEGATIVE 10/21/2019 2347   [place] NEGATIVE 10/21/2019 2347   KETONESUR NEGATIVE 10/21/2019 2347   PROTEINUR NEGATIVE 10/21/2019 2347   NITRITE NEGATIVE 10/21/2019 2347   LEUKOCYTESUR NEGATIVE 10/21/2019 2347    Radiological Exams on Admission: CT ABDOMEN PELVIS WO CONTRAST  Result Date: 11/16/2020 CLINICAL DATA:  Abdominal pain nausea EXAM: CT ABDOMEN AND PELVIS WITHOUT CONTRAST TECHNIQUE: Multidetector CT imaging of the abdomen and pelvis was performed following the standard protocol without IV contrast. COMPARISON:  August 23rd 2021 FINDINGS: Lower chest: The visualized heart size within normal limits. No pericardial fluid/thickening. There is a small hiatal hernia present. The visualized portions of the lungs are clear. Hepatobiliary: Although limited due to the lack of intravenous contrast, normal in appearance without gross focal abnormality. No evidence of calcified gallstones or biliary ductal dilatation. Pancreas:  Unremarkable.  No surrounding inflammatory changes. Spleen: Normal in size. Although limited due to the lack of intravenous contrast, normal in appearance. Adrenals/Urinary Tract: Both adrenal glands appear normal. There is a punctate calcifications seen within the upper pole of the left kidney. No right-sided renal or collecting system calculi are seen. Bladder is unremarkable. Stomach/Bowel: A small hiatal hernia is present, however  otherwise unremarkable stomach is seen. A right anterior abdominal wall ileostomy is noted. There is been a prior right hemicolectomy wound seen. No dilated loops of bowel are noted. Vascular/Lymphatic: There are no enlarged abdominal or pelvic lymph nodes. Scattered aortic atherosclerotic calcifications are seen without aneurysmal dilatation. Reproductive: The patient is status post hysterectomy. No adnexal masses or collections seen. Other: There are 2 fat containing the upper anterior abdominal wall hernia is present there is also a stable left anterior abdominal wall parastomal hernia containing loops small bowel. Musculoskeletal: No acute or significant osseous findings. Degenerative changes are seen throughout the lumbar spine. 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 No definite evidence of obstruction. Punctate nonobstructing left renal calculi Aortic Atherosclerosis (ICD10-I70.0). Electronically Signed   By: Prudencio Pair M.D.   On: 11/16/2020 00:59    EKG: Independently reviewed.  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  Ileostomy 10/18/2017   Acute kidney injury superimposed on CKD (HCC)   Hyperkalemia   Hyponatremia   Nausea & vomiting   Acute-on-chronic kidney injury ([place])    1. N/V dehydration - 1. 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 IVF: 2L NS bolus 1. Then isotonic bicarb at 125 3. BMP Q6H 4. US renal to r/o obstruction 5. Strict intake and output 6.  Tele monitor for the hyperkalemia 1. Got temp measures in ED 7. May need nephro consult in AM 8. Zofran PRN 2. HTN - 1. Home med rec pending 2. PRN labetalol for the moment 3. DM2 - 1. Home med rec pending 2. But  looks like shes not chronically on meds for this 3. CBG checks q4h for the moment  DVT prophylaxis: Heparin [place] Code Status: Full Family Communication: No family in room Disposition Plan: Home after AKI resolved and tolerating POs Consults called: None Admission status: Admit to inpatient  Severity of Illness: The appropriate patient status for this patient is INPATIENT. Inpatient status is judged to be reasonable and necessary in order to provide the required intensity of service to ensure the patient's safety. The patient's presenting symptoms, physical exam findings, and initial radiographic and laboratory data in the context of their chronic comorbidities is felt to place them at high risk for further clinical deterioration. Furthermore, it is not anticipated that the patient will be medically stable for discharge from the hospital within 2 midnights of admission. The following factors support the patient status of inpatient.   IP status due to AKI with creat 3.2 up from 1.5 baseline.   * I certify that at the point of admission it is my clinical judgment that the patient will require inpatient hospital care spanning beyond 2 midnights from the point of admission due to high intensity of service, high risk for further deterioration and high frequency of surveillance required.*    GARDNER, JARED M. DO [facility]  How to contact the [facility] Attending or Consulting provider [place] or covering provider during after hours [place], for this patient?  1. Check the care team in [facility] and look for a) attending/consulting [facility] provider listed and b) the [facility] team listed 2. Log into [email or web address]  Amion Physician Scheduling and messaging for groups and whole hospitals  On call and physician scheduling software for group practices, residents, hospitalists and other medical providers for call, clinic, rotation and shift schedules. OnCall [employer] is a hospital-wide system for scheduling doctors  and paging doctors on call. EasyPlot is for scientific plotting and data analysis.  [email or web address]  and use [place]'s universal password to access. If you do not have the password, please contact the hospital operator.  3. Locate the [facility] provider you are looking for under [facility] and page to a number that you can be directly reached. 4. If you still have difficulty reaching the provider, please page the [facility] (Director on Call) for the Hospitalists listed on amion for assistance.  11/16/2020, 2:01 AM

## 2020-11-16 NOTE — Consult Note (Addendum)
[place] Nurse ostomy consult note Patient receiving care in [facility] [id number] This is not a new ostomy. Patient is independent with pouch changes.  Stoma type/location: RUQ ileostomy Peristomal assessment: Per patient her skin is irritated. Will follow up tomorrow as patient states she just changed her pouch this morning.  Treatment options for stomal/peristomal skin:  Instruction for "Crusting" with Stoma Powder and Skin Barrier Wipes: 1. After cleaning around the stoma WITH WATER ONLY, sprinkle Stoma Powder on the irritated skin. 2. Spread the powder around with your finger.  The powder will adhere ONLY to wet, raw skin.  Brush any loose powder off. 3. DAB the powder that stuck to the irritated skin with a skin barrier wipe.  Do NOT rub, only dab the powder to moisten it. 4. Allow the powder to air dry.  You can repeat the process of spreading powder and dabbing up to three times, allowing each process to air dry. 5. Place the prepared pouching system around the stoma. Output: Thin brown Ostomy pouching: 2pc. Pouch ([place]# [phone]) Skin barrier Kellie Simmering # 2) Barrier Rings Kellie Simmering # [phone]) Stoma Powder Kellie Simmering #6) 2M skin barrier wipes Kellie Simmering # [phone]) Supplies ordered and should be placed by the bedside.  Cathlean Marseilles Tamala Julian, MSN, RN, [place], Lysle Pearl, [facility] Wound Treatment Associate Pager [phone]

## 2020-11-16 NOTE — ED Provider Notes (Signed)
[facility] Emergency Department Provider Note MRN:  [id number]  Arrival date & time: 11/16/20     Chief Complaint   Vomiting   History of Present Illness   [name] is a 75 y.o. year-old female with a history of colostomy, CAD, diabetes presenting to the ED with chief complaint of vomiting.  Persistent nausea and vomiting over the past 2 or 3 days.  Endorsing abdominal discomfort but normal colostomy output.  Denies fever, no chest pain or shortness of breath.  Feels very dehydrated.  Unable to take any home meds.  Review of Systems  A complete 10 system review of systems was obtained and all systems are negative except as noted in the HPI and PMH.   Patient's Health History    Past Medical History:  Diagnosis Date  . Allergy   . Anemia    past hx per pt   . Anxiety   . Arthritis    back   . Arthropathy, unspecified, site unspecified   . Blind loop syndrome   . Carotid artery disease ([place])    documented occlusion of left ICA  . Cataract    removed both eyes   . Clotting disorder (HCC)    Hx Clot in leg per pt   . Colostomy present ([place])   . Coronary artery disease    a. known 60-70% mid-LAD stenosis with caths in 1999, 2002, 2006, and 2012 showing stable anatomy b. low-risk NST in 10/2015  . Depression   . Diabetes insipidus ([place])   . Diabetes mellitus   . Diverticulosis   . Dizziness    chronic  . Dyslipidemia   . Dyspnea    with exertion  . Fatty liver   . GERD (gastroesophageal reflux disease)    hiatial hernia  . Heart murmur   . Hepatomegaly   . Hiatal hernia   . HTN (hypertension)   . Hyperlipidemia   . Hypertension   . Irritable bowel syndrome   . Melanosis coli   . Metatarsal fracture right  . Normal nuclear stress test    nml 01/03/10;06/19/07  . Other, mixed, or unspecified nondependent drug abuse, unspecified   . Pancreatitis, chronic ([place])   . Peripheral vascular disease ([place]) 02/20/10   s/p stent of left SFA  .  Seizure ([place]) 11/04/2018   last 1 yr 2020 per pt unsure month   . Sleep apnea    no cpap   . Stroke ([place])   . Thyroid nodule   . Unspecified essential hypertension   . Vitamin B12 deficiency     Past Surgical History:  Procedure Laterality Date  . ABDOMINAL HYSTERECTOMY     partial  . AUGMENTATION MAMMAPLASTY     bilateral 1976 retro   . BREAST ENHANCEMENT SURGERY    . CARDIAC CATHETERIZATION  07/07/98;08/31/01;03/04/05   60 -70% LAD  . CATARACT EXTRACTION    . COLON RESECTION N/A 10/14/2017   Procedure: EXPLORATORY LAPAROTOMY, EXTENDED RIGHT COLECTOMY; APPLICATION OF ABDOMINAL VACUUM DRESSING;  Surgeon: Jovita Kussmaul, MD;  Location: [facility];  Service: General;  Laterality: N/A;  . COLONOSCOPY    . ENDOBRONCHIAL ULTRASOUND Bilateral 02/24/2017   Procedure: ENDOBRONCHIAL ULTRASOUND;  Surgeon: Collene Gobble, MD;  Location: [facility] ENDOSCOPY;  Service: Cardiopulmonary;  Laterality: Bilateral;  . EYE SURGERY Left Nov. 2016   Cataract  . EYE SURGERY Right 2001   Cataract  . FEMORAL ARTERY STENT     Left leg  . LAPAROTOMY N/A 10/16/2017  Procedure: EXPLORATORY LAPAROTOMY, PARTIAL OMENTECTOMY, RESECTION ISCHEMIC ILEUM 623JS, APPLICATION OF VAC ABDOMINAL DRESSING;  Surgeon: Armandina Gemma, MD;  Location: [facility];  Service: General;  Laterality: N/A;  . LAPAROTOMY N/A 10/18/2017   Procedure: RE-EXPLORATION OF ABDOMEN, ILEOSTOMY CREATION;  Surgeon: Stark Klein, MD;  Location: [facility];  Service: General;  Laterality: N/A;  . LUNG SURGERY  03/2017   Benign polyps removed  . PATELLA REALIGNMENT Left   . POLYPECTOMY    . SIGMOIDOSCOPY    . TONSILLECTOMY    . TOTAL ABDOMINAL HYSTERECTOMY    . WRIST SURGERY     right    Family History  Problem Relation Age of Onset  . Hypertension Mother   . Osteoarthritis Mother   . Pulmonary embolism Mother   . Hypertension Father        aneurysm  . Stroke Father   . Stroke Other   . Colon cancer Paternal Grandfather   . Breast cancer Neg Hx   . Colon  polyps Neg Hx   . Esophageal cancer Neg Hx   . Rectal cancer Neg Hx   . Stomach cancer Neg Hx     Social History   Socioeconomic History  . Marital status: Divorced    Spouse name: Not on file  . Number of children: 2  . Years of education: Not on file  . Highest education level: Not on file  Occupational History  . Occupation: retired    Fish farm manager: DISABLED  Tobacco Use  . Smoking status: Former Smoker    Packs/day: 1.00    Years: 50.00    Pack years: 50.00    Types: Cigarettes    Quit date: 10/14/2010    Years since quitting: 10.0  . Smokeless tobacco: Never Used  Vaping Use  . Vaping Use: Never used  Substance and Sexual Activity  . Alcohol use: No  . Drug use: No  . Sexual activity: Not on file  Other Topics Concern  . Not on file  Social History Narrative  . Not on file   Social Determinants of Health   Financial Resource Strain: Not on file  Food Insecurity: Not on file  Transportation Needs: Not on file  Physical Activity: Not on file  Stress: Not on file  Social Connections: Not on file  Intimate Partner Violence: Not on file     Physical Exam   Vitals:   11/16/20 0001 11/16/20 0108  BP: (!) 146/91 106/66  Pulse: (!) 107   Resp: 18 (!) 24  Temp: 98.2 F (36.8 C)   SpO2: 100%     CONSTITUTIONAL: Well-appearing, NAD NEURO:  Alert and oriented x 3, no focal deficits EYES:  eyes equal and reactive ENT/NECK:  no LAD, no JVD CARDIO: Tachycardic rate, well-perfused, normal S1 and S2 PULM:  CTAB no wheezing or rhonchi GI/GU:  normal bowel sounds, non-distended, non-tender MSK/SPINE:  No gross deformities, no edema SKIN:  no rash, atraumatic PSYCH:  Appropriate speech and behavior  *Additional and/or pertinent findings included in MDM below  Diagnostic and Interventional Summary    EKG Interpretation  Date/Time:  Wednesday November 15 2020 12:01:05 EST Ventricular Rate:  133 PR Interval:  140 QRS Duration: 90 QT Interval:  286 QTC  Calculation: 425 R Axis:   46 Text Interpretation: Sinus tachycardia ST & T wave abnormality, consider inferior ischemia Abnormal ECG Confirmed by Gerlene Fee [phone]) on 11/16/2020 12:17:46 AM      Labs Reviewed  LIPASE, BLOOD - Abnormal; Notable for the  following components:      Result Value   Lipase 104 (*)    All other components within normal limits  COMPREHENSIVE METABOLIC PANEL - Abnormal; Notable for the following components:   Sodium 124 (*)    Potassium 6.0 (*)    CO2 13 (*)    Glucose, Bld 126 (*)    BUN 47 (*)    Creatinine, Ser 3.29 (*)    Calcium 8.8 (*)    Total Protein 5.8 (*)    Albumin 2.9 (*)    AST 43 (*)    Alkaline Phosphatase 139 (*)    GFR, Estimated 14 (*)    All other components within normal limits  CBC - Abnormal; Notable for the following components:   WBC 11.8 (*)    Hemoglobin 10.7 (*)    HCT 33.8 (*)    MCH 25.4 (*)    RDW 17.8 (*)    All other components within normal limits  TROPONIN I (HIGH SENSITIVITY) - Abnormal; Notable for the following components:   Troponin I (High Sensitivity) 21 (*)    All other components within normal limits  TROPONIN I (HIGH SENSITIVITY) - Abnormal; Notable for the following components:   Troponin I (High Sensitivity) 24 (*)    All other components within normal limits  URINALYSIS, ROUTINE W REFLEX MICROSCOPIC  BASIC METABOLIC PANEL  LACTIC ACID, PLASMA  BASIC METABOLIC PANEL  BASIC METABOLIC PANEL  BASIC METABOLIC PANEL  BASIC METABOLIC PANEL    CT ABDOMEN PELVIS WO CONTRAST  Final Result      Medications  calcium gluconate 1 g/ 50 mL sodium chloride IVPB (1,000 mg Intravenous New Bag/Given 11/16/20 0059)  sodium bicarbonate 150 mEq in sterile water 1,000 mL infusion (has no administration in time range)  ondansetron (ZOFRAN-ODT) disintegrating tablet 4 mg (4 mg Oral Given 11/15/20 1225)  sodium chloride 0.9 % bolus 1,000 mL (1,000 mLs Intravenous New Bag/Given 11/16/20 0100)  sodium chloride 0.9 % bolus  1,000 mL (1,000 mLs Intravenous New Bag/Given 11/16/20 0100)  ondansetron (ZOFRAN) injection 4 mg (4 mg Intravenous Given 11/16/20 0052)  dextrose 50 % solution 50 mL (50 mLs Intravenous Given 11/16/20 0055)  insulin aspart (novoLOG) injection 5 Units (5 Units Intravenous Given 11/16/20 0056)     Procedures  /  Critical Care .Critical Care Performed by: Maudie Flakes, MD Authorized by: Maudie Flakes, MD   Critical care provider statement:    Critical care time (minutes):  34   Critical care was necessary to treat or prevent imminent or life-threatening deterioration of the following conditions: acute renal failure with hyperkalemia.   Critical care was time spent personally by me on the following activities:  Discussions with consultants, evaluation of patient's response to treatment, examination of patient, ordering and performing treatments and interventions, ordering and review of laboratory studies, ordering and review of radiographic studies, pulse oximetry, re-evaluation of patient's condition, obtaining history from patient or surrogate and review of old charts    ED Course and Medical Decision Making  I have reviewed the triage vital signs, the nursing notes, and pertinent available records from the EMR.  Listed above are laboratory and imaging tests that I personally ordered, reviewed, and interpreted and then considered in my medical decision making (see below for details).  Dehydration, AKI, persistent nausea vomiting, will need CT to exclude some type of bowel obstruction given her complex surgical abdominal history.  She has hyperkalemia, will treat with fluids, insulin, D50, calcium.  Will  need admission.       Barth Kirks. Sedonia Small, MD [place] [email or web address]  Final Clinical Impressions(s) / ED Diagnoses     ICD-10-CM   1. Nausea and vomiting, intractability of vomiting not specified, unspecified vomiting type  R11.2    2. AKI (acute kidney injury) ([place])  N17.9     ED Discharge Orders    None       Discharge Instructions Discussed with and Provided to Patient:   Discharge Instructions   None       Maudie Flakes, MD 11/16/20 0139

## 2020-11-16 NOTE — ED Notes (Signed)
Breakfast ordered 

## 2020-11-16 NOTE — Progress Notes (Signed)
75 year old white female BMI >30 home dwelling CAD + carotid artery disease with left ICA occlusion status post multiple caths 99 2002 2012 with chronic LAD stenosis 60 to 70%, DM TY 2 Blind loop syndrome/ischemic small bowel status post ileostomy with high output 10/14/2017 with resection of ischemic ileum 130 cm [facility] was placed 10/18/2017 OSA Chronic CKD stage IIIa DM TY 2 Seizure disorder Most recent admission January 2021 acute respiratory failure encephalopathy 2/2 coronavirus  Returns to emergency room for intermittent nausea vomiting X 10 days Found to have AKI on admission in addition to electrolyte disturbances  Reports having episodes like this once or twice in the past no abdominal pain no nausea at present and thinks can tolerated diet Has had liquids without event and feels much better than prior  O/e BP (!) 193/84   Pulse 90   Temp 97.7 F (36.5 C) (Oral)   Resp 13   SpO2 100%   Obese alert coherent no distress EOMI NCAT no focal deficit S1-S2 no murmur no rub no gallop Abdomen is soft nondistended there is some liquid stool in the right-sided ostomy ROM is intact moving all 4 limbs equally no specific deficit   Severe hyperkalemia Given Kayexalate 30 twice daily likely etiology secondary to AKI superimposed on CKD Recheck labs every 12 Continue IV fluids but change to saline Hyponatremia Likely secondary to fluid losses from diarrhea in addition to other underlying issues Change fluids to saline with bicarb as possible Monitor trends over the next several days AKI Lactic acidosis Secondary to nausea vomiting in addition to prior to admission losartan 50 mg that has been held Monitor trends on labs Get urinary sodium and urinary creatinine to rule out obstructive pathology and determine etiology of ATN Do not think infected at this time 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

## 2020-11-17 ENCOUNTER — Inpatient Hospital Stay (HOSPITAL_COMMUNITY): Payer: 59

## 2020-11-17 DIAGNOSIS — I5031 Acute diastolic (congestive) heart failure: Secondary | ICD-10-CM

## 2020-11-17 LAB — BRAIN NATRIURETIC PEPTIDE: B Natriuretic Peptide: 332.9 pg/mL — ABNORMAL HIGH (ref 0.0–100.0)

## 2020-11-17 LAB — GLUCOSE, CAPILLARY
Glucose-Capillary: 91 mg/dL (ref 70–99)
Glucose-Capillary: 95 mg/dL (ref 70–99)
Glucose-Capillary: 82 mg/dL (ref 70–99)
Glucose-Capillary: 114 mg/dL — ABNORMAL HIGH (ref 70–99)

## 2020-11-17 LAB — BASIC METABOLIC PANEL
Anion gap: 8 (ref 5–15)
BUN: 41 mg/dL — ABNORMAL HIGH (ref 8–23)
CO2: 20 mmol/L — ABNORMAL LOW (ref 22–32)
Calcium: 8 mg/dL — ABNORMAL LOW (ref 8.9–10.3)
Chloride: 100 mmol/L (ref 98–111)
Creatinine, Ser: 3.27 mg/dL — ABNORMAL HIGH (ref 0.44–1.00)
GFR, Estimated: 14 mL/min — ABNORMAL LOW (ref >60)
Glucose, Bld: 91 mg/dL (ref 70–99)
Potassium: 4.7 mmol/L (ref 3.5–5.1)
Sodium: 128 mmol/L — ABNORMAL LOW (ref 135–145)

## 2020-11-17 LAB — ECHOCARDIOGRAM LIMITED
AR max vel: 2.08 cm2
AV Area VTI: 2.01 cm2
AV Area mean vel: 2.22 cm2
AV Mean grad: 2 mmHg
AV Peak grad: 4.8 mmHg
Ao pk vel: 1.09 m/s
Area-P 1/2: 2.83 cm2
Height: 63 in
S' Lateral: 2.5 cm
Weight: 2768.98 oz

## 2020-11-17 LAB — CBC WITH DIFFERENTIAL/PLATELET
Abs Immature Granulocytes: 0.07 10*3/uL (ref 0.00–0.07)
Basophils Absolute: 0 10*3/uL (ref 0.0–0.1)
Basophils Relative: 0 %
Eosinophils Absolute: 0.4 10*3/uL (ref 0.0–0.5)
Eosinophils Relative: 4 %
HCT: 26.2 % — ABNORMAL LOW (ref 36.0–46.0)
Hemoglobin: 8.6 g/dL — ABNORMAL LOW (ref 12.0–15.0)
Immature Granulocytes: 1 %
Lymphocytes Relative: 24 %
Lymphs Abs: 2.4 10*3/uL (ref 0.7–4.0)
MCH: 25.4 pg — ABNORMAL LOW (ref 26.0–34.0)
MCHC: 32.8 g/dL (ref 30.0–36.0)
MCV: 77.3 fL — ABNORMAL LOW (ref 80.0–100.0)
Monocytes Absolute: 0.9 10*3/uL (ref 0.1–1.0)
Monocytes Relative: 9 %
Neutro Abs: 6.2 10*3/uL (ref 1.7–7.7)
Neutrophils Relative %: 62 %
Platelets: 233 10*3/uL (ref 150–400)
RBC: 3.39 MIL/uL — ABNORMAL LOW (ref 3.87–5.11)
RDW: 17.9 % — ABNORMAL HIGH (ref 11.5–15.5)
WBC: 10 10*3/uL (ref 4.0–10.5)
nRBC: 0 % (ref 0.0–0.2)

## 2020-11-17 LAB — HEMOGLOBIN A1C
Hgb A1c MFr Bld: 6 % — ABNORMAL HIGH (ref 4.8–5.6)
Mean Plasma Glucose: 125.5 mg/dL

## 2020-11-17 LAB — MAGNESIUM: Magnesium: 1.2 mg/dL — ABNORMAL LOW (ref 1.7–2.4)

## 2020-11-17 LAB — D-DIMER, QUANTITATIVE: D-Dimer, Quant: 2.61 ug/mL-FEU — ABNORMAL HIGH (ref 0.00–0.50)

## 2020-11-17 LAB — TSH: TSH: 0.775 u[IU]/mL (ref 0.350–4.500)

## 2020-11-17 LAB — C-REACTIVE PROTEIN: CRP: 1.1 mg/dL — ABNORMAL HIGH (ref <1.0)

## 2020-11-17 MED ORDER — METOPROLOL TARTRATE 5 MG/5ML IV SOLN
10.0000 mg | Freq: Three times a day (TID) | INTRAVENOUS | Status: DC | PRN
  Administered 2020-11-18: 10 mg via INTRAVENOUS
  Filled 2020-11-17: qty 10

## 2020-11-17 MED ORDER — METOPROLOL TARTRATE 100 MG PO TABS
100.0000 mg | ORAL_TABLET | Freq: Two times a day (BID) | ORAL | Status: DC
  Administered 2020-11-17 – 2020-11-23 (×12): 100 mg via ORAL
  Filled 2020-11-17 (×13): qty 1

## 2020-11-17 MED ORDER — SODIUM POLYSTYRENE SULFONATE 15 GM/60ML PO SUSP
30.0000 g | Freq: Two times a day (BID) | ORAL | Status: DC
  Administered 2020-11-17: 30 g via ORAL
  Filled 2020-11-17: qty 120

## 2020-11-17 MED ORDER — INSULIN ASPART 100 UNIT/ML ~~LOC~~ SOLN
0.0000 [IU] | Freq: Three times a day (TID) | SUBCUTANEOUS | Status: DC
  Administered 2020-11-22 (×2): 1 [IU] via SUBCUTANEOUS

## 2020-11-17 MED ORDER — OPIUM 10 MG/ML (1%) PO TINC
1.0000 mL | Freq: Two times a day (BID) | ORAL | Status: AC
  Administered 2020-11-17 – 2020-11-18 (×3): 10 mg via ORAL
  Filled 2020-11-17 (×3): qty 1

## 2020-11-17 MED ORDER — INSULIN ASPART 100 UNIT/ML ~~LOC~~ SOLN: 0.0000 [IU] | Freq: Every day | SUBCUTANEOUS | Status: DC

## 2020-11-17 MED ORDER — ADULT MULTIVITAMIN W/MINERALS CH
1.0000 | ORAL_TABLET | Freq: Every day | ORAL | Status: DC
  Administered 2020-11-17 – 2020-11-23 (×7): 1 via ORAL
  Filled 2020-11-17 (×7): qty 1

## 2020-11-17 MED ORDER — SODIUM CHLORIDE 0.9 % IV SOLN: INTRAVENOUS | Status: DC

## 2020-11-17 MED ORDER — MELATONIN 5 MG PO TABS
5.0000 mg | ORAL_TABLET | Freq: Every evening | ORAL | Status: DC | PRN
  Administered 2020-11-17 – 2020-11-22 (×3): 5 mg via ORAL
  Filled 2020-11-17 (×4): qty 1

## 2020-11-17 MED ORDER — PERFLUTREN LIPID MICROSPHERE
1.0000 mL | INTRAVENOUS | Status: AC | PRN
  Administered 2020-11-17: 5 mL via INTRAVENOUS
  Filled 2020-11-17: qty 10

## 2020-11-17 MED ORDER — LOPERAMIDE HCL 2 MG PO CAPS
2.0000 mg | ORAL_CAPSULE | Freq: Four times a day (QID) | ORAL | Status: DC | PRN
  Administered 2020-11-21: 2 mg via ORAL
  Filled 2020-11-17: qty 1

## 2020-11-17 MED ORDER — LACTATED RINGERS IV SOLN: INTRAVENOUS | Status: DC

## 2020-11-17 MED ORDER — MAGNESIUM SULFATE 2 GM/50ML IV SOLN
2.0000 g | Freq: Once | INTRAVENOUS | Status: AC
  Administered 2020-11-17: 2 g via INTRAVENOUS
  Filled 2020-11-17: qty 50

## 2020-11-17 MED ORDER — LEVETIRACETAM IN NACL 500 MG/100ML IV SOLN
500.0000 mg | Freq: Two times a day (BID) | INTRAVENOUS | Status: DC
  Administered 2020-11-17 – 2020-11-23 (×12): 500 mg via INTRAVENOUS
  Filled 2020-11-17 (×13): qty 100

## 2020-11-17 MED ORDER — LOPERAMIDE HCL 2 MG PO CAPS
4.0000 mg | ORAL_CAPSULE | Freq: Once | ORAL | Status: AC
  Administered 2020-11-17: 4 mg via ORAL
  Filled 2020-11-17: qty 2

## 2020-11-17 MED ORDER — ATORVASTATIN CALCIUM 40 MG PO TABS
40.0000 mg | ORAL_TABLET | Freq: Every day | ORAL | Status: DC
  Administered 2020-11-17 – 2020-11-23 (×7): 40 mg via ORAL
  Filled 2020-11-17 (×7): qty 1

## 2020-11-17 MED ORDER — ENSURE ENLIVE PO LIQD
237.0000 mL | Freq: Two times a day (BID) | ORAL | Status: DC
  Administered 2020-11-17 – 2020-11-22 (×7): 237 mL via ORAL

## 2020-11-17 NOTE — Progress Notes (Signed)
Patient family wants an update. Patient give consent to speak with family. Number is in the chart. Will contact her daughter [name].  [name]

## 2020-11-17 NOTE — Consult Note (Addendum)
[place] Nurse ostomy follow up Patient care given in [facility] [phone] Patient states that she did not know that she was Covid positive and that no one told her.  Ileostomy created in January of 2019 Stoma type/location: Ileostomy RUQ  Stomal assessment/size:  1" budded, pink and moist Peristomal assessment: Irritant dermatitis with pain Treatment options for stomal/peristomal skin:  Instruction for "Crusting" with Stoma Powder and Skin Barrier Wipes: 1. After cleaning around the stoma WITH WATER ONLY, sprinkle Stoma Powder on the irritated skin. 2. Spread the powder around with your finger.  The powder will adhere ONLY to wet, raw skin.  Brush any loose powder off. 3. DAB the powder that stuck to the irritated skin with a skin barrier wipe.  Do NOT rub, only dab the powder to moisten it. 4. Allow the powder to air dry.  You can repeat the process of spreading powder and dabbing up to three times, allowing each process to air dry. 5. Place the prepared pouching system around the stoma. Output: brown thin with high output Ostomy pouching: 2pc. Pouch was placed today Kellie Simmering # [phone]) with skin barrier Kellie Simmering # 2) and a barrier ring Kellie Simmering # [phone]), however with the high output that the patient is experiencing at this time, I would recommend next pouch change to convert to a high output pouch Kellie Simmering # [phone]) with same skin barrier and barrier ring and a bedside urine bag.  Education provided: I was able to educate the patient and the bedside nurse on crusting technique and change of pouch. The patient is familiar with the process as she lives alone but states that her skin gets irritated a lot. I am not confident that the patient has been crusting correctly at home.  ICD-10 CM Codes for Irritant Dermatitis L24B3 - Related to fecal stoma   Will not follow at this time. Reconsult [place] team if needed.   Cathlean Marseilles Tamala Julian, MSN, RN, [place], Lysle Pearl, [facility] Wound Treatment Associate Pager [phone]

## 2020-11-17 NOTE — Progress Notes (Signed)
Please see in addition to PT note:   SATURATION QUALIFICATIONS: (This note is used to comply with regulatory documentation for home oxygen)  Patient Saturations on Room Air at Rest = 99%  Patient Saturations on Room Air while Ambulating = 84%  Patient Saturations on 2 Liters of oxygen while Ambulating = DNT, able to recover to greater than 90% on RA   Please briefly explain why patient needs home oxygen:able to recover to greater than 90% on room air   Windell Norfolk, DPT, [place] Physical Therapist [place]    Pager [phone] Acute Rehab Office [phone]

## 2020-11-17 NOTE — Progress Notes (Signed)
[facility] paged that patient is requesting sleep medication. Arthor Captain LPN

## 2020-11-17 NOTE — TOC Initial Note (Signed)
Transition of Care [facility]) - Initial/Assessment Note    Patient Details  Name: [name] MRN: [id number] Date of Birth: [birth year]  Transition of Care [facility]) CM/SW Contact:    Bartholomew Crews, RN Phone Number: [phone] 11/17/2020, 3:40 PM  Clinical Narrative:                  Spoke with patient on her mobile phone. PTA home alone. Stated her son and his wife live up the road and they assist her as needed. She stated that her daughter-in-law will provide transportation home. She is agreeable to home health PT per recommendations. She stated that she had home health in the past, but could not recall agency. After chart review, [facility] identified - referral accepted for PT. Patient will need HH PT order with [facility]. Patient has a RW at home. She is currently on oxygen and does not have oxygen at home. Will follow for oxygen needs. TOC following for transition needs.   Expected Discharge Plan: [place] Barriers to Discharge: Continued Medical Work up   Patient Goals and CMS Choice Patient states their goals for this hospitalization and ongoing recovery are:: return home CMS Medicare.gov Compare Post Acute Care list provided to:: Patient Choice offered to / list presented to : Patient  Expected Discharge Plan and Services Expected Discharge Plan: [place] In-house Referral: NA Discharge Planning Services: CM Consult Post Acute Care Choice: [place] arrangements for the past 2 months: Single Family Home                 DME Arranged: N/A DME Agency: NA       HH Arranged: PT HH Agency: [facility] Date HH Agency Contacted: 11/17/20 Time [place]: 1539 Representative spoke with at [place]: [place] Arrangements/Services Living arrangements for the past 2 months: [place] with:: Self Patient language and need for interpreter reviewed:: Yes Do you feel safe going back to the  place where you live?: Yes      Need for Family Participation in Patient Care: Yes (Comment) Care giver support system in place?: Yes (comment) Current home services: DME (RW) Criminal Activity/Legal Involvement Pertinent to Current Situation/Hospitalization: No - Comment as needed  Activities of Daily Living Home Assistive Devices/Equipment: None ADL Screening (condition at time of admission) Patient's cognitive ability adequate to safely complete daily activities?: Yes Is the patient deaf or have difficulty hearing?: No Does the patient have difficulty seeing, even when wearing glasses/contacts?: No Does the patient have difficulty concentrating, remembering, or making decisions?: No Patient able to express need for assistance with ADLs?: Yes Does the patient have difficulty dressing or bathing?: No Independently performs ADLs?: Yes (appropriate for developmental age) Does the patient have difficulty walking or climbing stairs?: No Weakness of Legs: Both Weakness of Arms/Hands: None  Permission Sought/Granted                  Emotional Assessment Appearance:: Appears stated age Attitude/Demeanor/Rapport: Engaged Affect (typically observed): Accepting Orientation: : Oriented to Self,Oriented to  Time,Oriented to Place,Oriented to Situation Alcohol / Substance Use: Not Applicable Psych Involvement: No (comment)  Admission diagnosis:  AKI (acute kidney injury) ([place]) [N17.9] PNA (pneumonia) [J18.9] Acute-on-chronic kidney injury ([place]) [N17.9, N18.9] Nausea and vomiting, intractability of vomiting not specified, unspecified vomiting type [R11.2] Patient Active Problem List   Diagnosis Date Noted  . Hyponatremia 11/16/2020  . Metabolic acidosis, NAG,  failure of bicarbonate regeneration 11/16/2020  . Nausea & vomiting 11/16/2020  . Acute-on-chronic kidney injury ([place]) 11/16/2020  . Hyperkalemia 10/16/2019  . Bilateral pneumonia 10/16/2019  . Acute metabolic encephalopathy  62/69/4854  . Seizures ([place]) 01/07/2019  . Cerebrovascular accident (CVA) due to occlusion of left carotid artery ([place]) 01/07/2019  . CAD, multiple vessel 01/07/2019  . H/O ischemic bowel disease 01/07/2019  . Acute bronchitis 12/04/2018  . Oral phase dysphagia 12/04/2018  . CKD (chronic kidney disease) stage 3, GFR 30-59 ml/min 11/04/2018  . Seizure ([place]) 11/03/2018  . 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 Scar of forehead 01/06/2018  . Traumatic ulceration of tongue 12/04/2017  . Ileostomy in place [facility]) 10/23/2017  . Pressure injury of skin 10/19/2017  . 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  Ileostomy 10/18/2017 10/14/2017  . Ischemic colitis ([place]) 10/14/2017  . Acute respiratory failure with hypoxemia ([place]) 10/14/2017  . Acute post-operative pain 10/14/2017  . Acute kidney injury superimposed on CKD ([place]) 10/14/2017  . Anemia 10/14/2017  . Nocturia more than twice per night 05/07/2017  . Diabetes due to underlying condition w oth circulatory comp ([place]) 05/07/2017  . Atherosclerotic PVD with intermittent claudication ([place]) 05/07/2017  . Poor sleep hygiene 05/07/2017  . 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 [place] lymphadenopathy   . Hemoptysis 02/19/2017  . Pulmonary nodules/lesions, multiple 02/19/2017  . Tobacco use 02/19/2017  . RUQ abdominal pain 01/13/2017  . Left-sided extracranial carotid artery occlusion 11/26/2016  . PAOD (peripheral arterial occlusive disease) ([place]) 11/26/2016  . Encounter for routine follow-up after surgery of the circulatory system 11/26/2016  . Chest pain with moderate risk of acute coronary syndrome 10/27/2015  . Acid indigestion 12/31/2013  . Pain in left hip 08/26/2013  . Screening for gout 05/13/2013  . Disorder of skin or subcutaneous tissue 12/22/2012  . Difficult or painful urination 09/02/2012  . Peripheral blood vessel disorder ([place]) 05/11/2012  . 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 Follow-up  examination, following unspecified surgery 05/11/2012  . Occlusion and stenosis of carotid artery without mention of cerebral infarction 04/14/2012  . Fatigue 09/24/2011  . Gonalgia 07/04/2011  . Avitaminosis D 05/02/2011  . Chronic pancreatitis ([place]) 04/30/2011  . Vitamin B12 deficiency 04/30/2011  . 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 Fatty liver 04/30/2011  . Adiposity 04/30/2011  . Other and unspecified general psychiatric examination 04/26/2011  . Abdominal pain 04/09/2011  . Iron deficiency anemia, unspecified  04/09/2011  . Other general symptoms  04/09/2011  . Asthma 04/09/2011  . Type 2 diabetes mellitus with diabetic peripheral angiopathy without gangrene ([place]) 01/24/2011  . Abnormal weight gain 11/30/2010  . Dyslipidemia   . Dizziness   . Right-sided extracranial carotid artery stenosis   . Normal nuclear stress test   . Feeling bilious 07/11/2010  . Underimmunization status 07/11/2010  . Pancreas (digestive gland) works poorly 05/30/2010  . B12 deficiency 03/19/2010  . DVT 03/16/2010  . BLIND LOOP SYNDROME 03/16/2010  . Diarrhea 03/16/2010  . Chronic depression 02/23/2010  . Cough 11/17/2009  . Plantar verruca 11/17/2009  . Arteriosclerosis of coronary artery 11/16/2009  . Claudication ([place]) 11/16/2009  . Compulsive tobacco user syndrome 11/16/2009  . HLD (hyperlipidemia) 11/16/2009  . Nontoxic single thyroid nodule 11/16/2009  . Obstructive apnea 11/16/2009  . Radial styloid tenosynovitis 11/16/2009  . LBP (low back pain) 11/16/2009  . LAXATIVE ABUSE 12/17/2007  . BP (high blood pressure) 12/17/2007  . Acid reflux 12/17/2007  . HIATAL HERNIA 12/17/2007  . IBS  12/17/2007  . MELANOSIS COLI 12/17/2007  . ARTHRITIS 12/17/2007  . HEPATOMEGALY 12/17/2007   PCP:  Leanna Battles, MD Pharmacy:   [id number] - [place], [place] [address] [place] [address] Phone: [phone] Fax:  [phone]     Social Determinants of Health (SDOH) Interventions    Readmission Risk Interventions No flowsheet data found.

## 2020-11-17 NOTE — Progress Notes (Signed)
Initial Nutrition Assessment  DOCUMENTATION CODES:   Obesity unspecified  INTERVENTION:   Ensure Enlive po BID, each supplement provides 350 kcal and 20 grams of protein  Magic cup TID with meals, each supplement provides 290 kcal and 9 grams of protein  MVI with minerals daily   NUTRITION DIAGNOSIS:   Inadequate oral intake related to nausea,vomiting as evidenced by per patient/family report.     GOAL:   Patient will meet greater than or equal to 90% of their needs    MONITOR:   PO intake,Supplement acceptance,Diet advancement,Weight trends,Labs,I & O's  REASON FOR ASSESSMENT:   Malnutrition Screening Tool    ASSESSMENT:   Pt admitted with AKI superimposed on CKD w/ electrolyte disturbances, metabolic acidosis, NAG, and failure of bicarbonate regeneration. PMH includes HTN, colostomy, seizure disorder, CKD stage 3.  Pt reports having intermittent N/V for 10 days PTA and states symptoms began to worsen over the last few days. Pt reports an inability to keep liquids or PO meds down. Pt endorses some abdominal discomfort, but denies pain. Per MD, CT abd/pelvis revealed parastomal hernia, no obstruction.   Pt reports normal ostomy output.    Reviewed weight history. No significant weight changes noted.   PO Intake: 50-85% x 2 recorded meals (67.5% average intake)  No UOP documented. Colostomy: 212ml output today   Labs: Na 128 (L), Cr 3.27 (H, trending down), Mg 1.2 (L), CBGs 91-95 Medications: lomotil, ss novolog TID w/ meals, creon, imodium, sodium bicarbonate, protonix, kayexalate  NUTRITION - FOCUSED PHYSICAL EXAM: Unable to perform at this time. Will attempt at follow-up.   Diet Order:   Diet Order            DIET SOFT Room service appropriate? Yes; Fluid consistency: Thin  Diet effective now                 EDUCATION NEEDS:   No education needs have been identified at this time  Skin:  Skin Assessment: Reviewed RN Assessment  Last BM:  2/4  222ml output via colostomy  Height:   Ht Readings from Last 1 Encounters:  11/16/20 5\' 3"  (1.6 m)    Weight:   Wt Readings from Last 1 Encounters:  11/16/20 78.5 kg   BMI:  Body mass index is 30.66 kg/m.  Estimated Nutritional Needs:   Kcal:  1650-1850  Protein:  85-95 grams  Fluid:  >1.65L (monitor for excessive output via colostomy)    Larkin Ina, MS, RD, LDN RD pager number and weekend/on-call pager number located in [place].

## 2020-11-17 NOTE — Progress Notes (Addendum)
PROGRESS NOTE                                                                                                                                                                                                             Patient Demographics:    [name], is a 75 y.o. female, DOB - 03/23/1946, JJH:[id number]  Outpatient Primary MD for the patient is Leanna Battles, MD    LOS - 1  Admit date - 11/15/2020    Chief Complaint  Patient presents with  . Vomiting       Brief Narrative (HPI from H&P) [name] is a 75 y.o. 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  She was found to have AKI hyperkalemia, dehydration and admitted to the hospital.    Subjective:    [name] today has, No headache, No chest pain, No abdominal pain - No Nausea, No new weakness tingling or numbness, no SOB, still has ++ ileostomy output.   Assessment  & Plan :     1. Abd Pain, acute on chronic diarrhea, has history of high output ileostomy. Hydrate with IV fluids, on scheduled Creon, Lomotil and as needed Imodium, 2 doses of Tincher of opium.  CT abdomen pelvis nonacute exam benign.  Abdominal pain much improved.   2. Diarrhea induced severe dehydration, hyponatremia, AKI, hyperkalemia, non-anion gap metabolic acidosis.  Admit of diarrhea as above, Oral bicarb, stop Kayexalate.  Monitor. HyperKalemia has resolved.  AKI improving have underlying CKD 3B with baseline creatinine around 1.5.  3. Acute Covid 19 infection  - CXR stable, no SOB, monitor, fully Vaccinated- boosted.  Encouraged the patient to sit up in chair in the daytime use I-S  and flutter valve for pulmonary toiletry and then prone in bed when at night.  Will advance activity and titrate down oxygen as possible.   Recent Labs  Lab 11/15/20 1153 11/16/20 0111 11/16/20 0300 11/16/20 0936 11/17/20 0706 11/17/20 0734  WBC 11.8*  --  19.5*  --  10.0  --   HGB 10.7*  --  10.5*  --  8.6*  --   HCT 33.8*  --  33.4*  --  26.2*  --   PLT 288  --  268  --  233  --   CRP  --   --   --   --  1.1*  --   BNP  --   --   --   --   --  332.9*  DDIMER  --   --   --   --  2.61*  --   AST 43*  --   --   --   --   --   ALT 24  --   --   --   --   --   ALKPHOS 139*  --   --   --   --   --   BILITOT 0.9  --   --   --   --   --   ALBUMIN 2.9*  --   --   --   --   --   LATICACIDVEN  --  5.5*  --   --   --   --   SARSCOV2NAA  --   --   --  POSITIVE*  --   --     4.  History of bowel ischemia requiring small bowel resection and how output ileostomy.  Supportive care, ostomy nurses following.  5.  History of seizures.  Due to ongoing diarrhea and questionable absorption for now IV Keppra instead of oral.  6.  Sinus tachycardia and hypertension.  On beta-blocker Norvasc monitor.  Hydrate.  7.  History of CAD.  On beta-blocker, dual antiplatelet therapy and Lipitor, no acute issues.  8.  Hypomagnesemia.  Replaced.       Condition - Extremely Guarded  Family Communication  :  Daughter [name] [phone], 11/17/20  Code Status :  Full  Consults  :  None   Procedures  :      Renal US - Non acute  CT - Right lower abdominal wall ileostomy with adjacent parastomal hernia. No definite evidence of obstruction. Punctate nonobstructing left renal calculi Aortic Atherosclerosis (ICD10-I70.0).      PUD Prophylaxis : PPI  Disposition Plan  :    Status is: Inpatient  Remains inpatient appropriate because:IV treatments appropriate due to intensity of illness or inability to take PO   Dispo: The patient is from: Home              Anticipated d/c is to: Home               Anticipated d/c date is: 3 days              Patient currently is not medically stable to d/c.   Difficult to place patient No   DVT Prophylaxis  :  Heparin    Lab Results  Component Value Date   PLT 233 11/17/2020    Diet :  Diet Order            DIET SOFT Room service appropriate? Yes; Fluid consistency: Thin  Diet effective now                  Inpatient Medications  Scheduled Meds: . amLODipine  10 mg Oral Daily  . ARIPiprazole  5 mg Oral Daily  . clopidogrel  75 mg Oral Daily  . diphenoxylate-atropine  1 tablet Oral TID  . heparin  5,000 Units Subcutaneous Q8H  . insulin aspart  0-5 Units Subcutaneous  QHS  . insulin aspart  0-9 Units Subcutaneous TID WC  . lipase/protease/amylase  36,000 Units Oral Daily  . loperamide  4 mg Oral Once  . metoprolol tartrate  100 mg Oral BID  . Opium  1 mL Oral BID  . pantoprazole  40 mg Oral BID  . sodium bicarbonate  650 mg Oral BID  . sodium polystyrene  30 g Oral BID   Continuous Infusions: . sodium chloride 100 mL/hr at 11/17/20 1022  . levETIRAcetam     PRN Meds:.acetaminophen **OR** [DISCONTINUED] acetaminophen, cyclobenzaprine, loperamide, metoprolol tartrate, [DISCONTINUED] ondansetron **OR** ondansetron (ZOFRAN) IV, traMADol  Antibiotics  :    Anti-infectives (From admission, onward)   None       Time Spent in minutes  30   Lala Lund M.D on 11/17/2020 at 11:56 AM  To page go to [email or web address]   [facility] -  Office  [phone]    See all Orders from today for further details    Objective:   Vitals:   11/16/20 1530 11/16/20 2052 11/17/20 0418 11/17/20 0838  BP: (!) 98/56 125/67 (!) 144/59 (!) 159/66  Pulse: 93 70 94 (!) 101  Resp: 18 19 18 19   Temp: 98.2 F (36.8 C) 98.4 F (36.9 C)  98.4 F (36.9 C)  TempSrc:      SpO2: 99% 97% 99% (!) 85%  Weight:  78.5 kg    Height:        Wt Readings from Last 3 Encounters:  11/16/20 78.5 kg  08/08/20 80.4 kg  06/27/20 78.5 kg      Intake/Output Summary (Last 24 hours) at 11/17/2020 1156 Last data filed at 11/17/2020 1042 Gross per 24 hour  Intake 1356.68 ml  Output 200 ml  Net 1156.68 ml     Physical Exam  Awake Alert, No new F.N deficits, Normal affect Williams.AT,PERRAL Supple Neck,No JVD, No cervical lymphadenopathy appriciated.  Symmetrical Chest wall movement, Good air movement bilaterally, CTAB RRR,No Gallops,Rubs or new Murmurs, No Parasternal Heave +ve B.Sounds, Abd Soft, No tenderness, R. Sided ileostomy bag in place No Cyanosis, Clubbing or edema, No new Rash or bruise       Data Review:    CBC Recent Labs  Lab 11/15/20 1153 11/16/20 0300 11/17/20 0706  WBC 11.8* 19.5* 10.0  HGB 10.7* 10.5* 8.6*  HCT 33.8* 33.4* 26.2*  PLT 288 268 233  MCV 80.1 80.3 77.3*  MCH 25.4* 25.2* 25.4*  MCHC 31.7 31.4 32.8  RDW 17.8* 17.9* 17.9*  LYMPHSABS  --   --  2.4  MONOABS  --   --  0.9  EOSABS  --   --  0.4  BASOSABS  --   --  0.0    Recent Labs  Lab 11/15/20 1153 11/16/20 0111 11/16/20 0300 11/16/20 1316 11/17/20 0706 11/17/20 0734  NA 124* 124* 128* 127* 128*  --   K 6.0* 5.9* 5.9* 5.3* 4.7  --   CL 98 97* 102 98 100  --   CO2 13* 12* 14* 18* 20*  --   GLUCOSE 126* 131* 51* 92 91  --   BUN 47* 56* 52* 46* 41*  --   CREATININE 3.29* 4.26* 3.77* 3.41* 3.27*  --   CALCIUM 8.8* 9.4 9.4 8.5* 8.0*  --   AST 43*  --   --   --   --   --   ALT 24  --   --   --   --   --  ALKPHOS 139*  --   --   --   --   --   BILITOT 0.9  --   --   --   --   --   ALBUMIN 2.9*  --   --   --   --   --   MG  --   --   --   --  1.2*  --   CRP  --   --   --   --  1.1*  --   DDIMER  --   --   --   --  2.61*  --   LATICACIDVEN  --  5.5*  --   --   --   --   HGBA1C  --   --   --   --  6.0*  --   BNP  --   --   --   --   --  332.9*    ------------------------------------------------------------------------------------------------------------------ No results for input(s): CHOL, HDL, LDLCALC, TRIG, CHOLHDL,  LDLDIRECT in the last 72 hours.  Lab Results  Component Value Date   HGBA1C 6.0 (H) 11/17/2020   ------------------------------------------------------------------------------------------------------------------ No results for input(s): TSH, T4TOTAL, T3FREE, THYROIDAB in the last 72 hours.  Invalid input(s): FREET3  Cardiac Enzymes No results for input(s): CKMB, TROPONINI, MYOGLOBIN in the last 168 hours.  Invalid input(s): CK ------------------------------------------------------------------------------------------------------------------    Component Value Date/Time   BNP 332.9 (H) 11/17/2020 0734    Micro Results Recent Results (from the past 240 hour(s))  SARS CORONAVIRUS 2 (TAT 6-24 HRS) Nasopharyngeal     Status: Abnormal   Collection Time: 11/16/20  9:36 AM   Specimen: Nasopharyngeal  Result Value Ref Range Status   SARS Coronavirus 2 POSITIVE (A) NEGATIVE Final    Comment: (NOTE) SARS-CoV-2 target nucleic acids are DETECTED.  The SARS-CoV-2 RNA is generally detectable in upper and lower respiratory specimens during the acute phase of infection. Positive results are indicative of the presence of SARS-CoV-2 RNA. Clinical correlation with patient history and other diagnostic information is  necessary to determine patient infection status. Positive results do not rule out bacterial infection or co-infection with other viruses.  The expected result is Negative.  Fact Sheet for Patients: SugarRoll.be  Fact Sheet for Healthcare Providers: https://www.woods-mathews.com/  This test is not yet approved or cleared by the [place] FDA and  has been authorized for detection and/or diagnosis of SARS-CoV-2 by FDA under an Emergency Use Authorization (EUA). This EUA will remain  in effect (meaning this test can be used) for the duration of the COVID-19 declaration under Section 564(b)(1) of the Act, 21 U. S.C. section  360bbb-3(b)(1), unless the authorization is terminated or revoked sooner.   Performed at [facility], [place] [address], [place], [place] [address]     Radiology Reports CT ABDOMEN PELVIS WO CONTRAST  Result Date: 11/16/2020 CLINICAL DATA:  Abdominal pain nausea EXAM: CT ABDOMEN AND PELVIS WITHOUT CONTRAST TECHNIQUE: Multidetector CT imaging of the abdomen and pelvis was performed following the standard protocol without IV contrast. COMPARISON:  August 23rd 2021 FINDINGS: Lower chest: The visualized heart size within normal limits. No pericardial fluid/thickening. There is a small hiatal hernia present. The visualized portions of the lungs are clear. Hepatobiliary: Although limited due to the lack of intravenous contrast, normal in appearance without gross focal abnormality. No evidence of calcified gallstones or biliary ductal dilatation. Pancreas:  Unremarkable.  No surrounding inflammatory changes. Spleen: Normal in size. Although limited due to the lack  of intravenous contrast, normal in appearance. Adrenals/Urinary Tract: Both adrenal glands appear normal. There is a punctate calcifications seen within the upper pole of the left kidney. No right-sided renal or collecting system calculi are seen. Bladder is unremarkable. Stomach/Bowel: A small hiatal hernia is present, however otherwise unremarkable stomach is seen. A right anterior abdominal wall ileostomy is noted. There is been a prior right hemicolectomy wound seen. No dilated loops of bowel are noted. Vascular/Lymphatic: There are no enlarged abdominal or pelvic lymph nodes. Scattered aortic atherosclerotic calcifications are seen without aneurysmal dilatation. Reproductive: The patient is status post hysterectomy. No adnexal masses or collections seen. Other: There are 2 fat containing the upper anterior abdominal wall hernia is present there is also a stable left anterior abdominal wall parastomal hernia containing loops small bowel.  Musculoskeletal: No acute or significant osseous findings. Degenerative changes are seen throughout the lumbar spine. 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 No definite evidence of obstruction. Punctate nonobstructing left renal calculi Aortic Atherosclerosis (ICD10-I70.0). Electronically Signed   By: Prudencio Pair M.D.   On: 11/16/2020 00:59   US RENAL  Result Date: 11/16/2020 CLINICAL DATA:  Acute kidney injury EXAM: RENAL / URINARY TRACT ULTRASOUND COMPLETE COMPARISON:  CT 11/16/2020, ultrasound 09/12/2016 FINDINGS: Right Kidney: Renal measurements: 9.1 x 4.0 x 4.0 cm = volume: 75 mL. Echogenicity is within normal limits. No concerning renal mass, shadowing calculus or hydronephrosis. Left Kidney: Renal measurements: 9.2 x 5.2 x 3.5 cm = volume: 86 mL. Echogenicity is within normal limits. No concerning renal mass, visible shadowing calculus or hydronephrosis. Few punctate calcifications seen in the upper pole left kidney on CT imaging are not well visualized on this sonographic exam. Bladder: Appears normal for degree of bladder distention. Other: None. IMPRESSION: Unremarkable renal ultrasound. Previously seen punctate upper pole left renal calculi are not well visualized. Electronically Signed   By: Lovena Le M.D.   On: 11/16/2020 03:23   DG CHEST PORT 1 VIEW  Result Date: 11/17/2020 CLINICAL DATA:  COVID-19 positivity EXAM: PORTABLE CHEST 1 VIEW COMPARISON:  10/21/2019 FINDINGS: Cardiac shadow is within normal limits. Aortic calcifications are seen. The lungs are well aerated bilaterally. No focal infiltrate is seen. No bony abnormality is noted. IMPRESSION: No acute abnormality noted. Electronically Signed   By: Inez Catalina M.D.   On: 11/17/2020 01:59

## 2020-11-17 NOTE — Progress Notes (Signed)
  Echocardiogram 2D Echocardiogram has been performed with Definity.  [name] 11/17/2020, 4:55 PM

## 2020-11-17 NOTE — Evaluation (Signed)
Physical Therapy Evaluation Patient Details Name: [name] MRN: [id number] DOB: Jul 05, 1946 Today's Date: 11/17/2020   History of Present Illness  75yo female presenting with 10 day hx of nausea and vomiting. Found to be covid positive. Admitted with metabolic acidosis, NAG, and failure of bicarbonate regeneration. PMH CAD, clotting disorder, DM, chronic dizziness, HLD, DOE, HTN, seizures, CVA, femoral artery stent, patella realignment  Clinical Impression   Patient received in bed, very pleasant and cooperative today. Able to mobilize on a close min guard basis with no device but quickly becomes short of breath and mildly tremulous- generally weak and mildly unsteady as a whole. See accompanying sat note for SpO2 details- did have a brief desat but was able to recover on room air. Safety awareness and judgement appears a bit impaired, question if this is close to her baseline. No recliner available, so left in bed in chair position with all needs met. Will benefit from HHPT f/u moving forward.     Follow Up Recommendations Home health PT    Equipment Recommendations  Rolling walker with 5" wheels;Other (comment) (shower bench)    Recommendations for Other Services       Precautions / Restrictions Precautions Precautions: Fall;Other (comment) Precaution Comments: watch O2/BP, Covid positive Restrictions Weight Bearing Restrictions: No      Mobility  Bed Mobility Overal bed mobility: Needs Assistance Bed Mobility: Supine to Sit;Sit to Supine     Supine to sit: Supervision;HOB elevated Sit to supine: Supervision;HOB elevated   General bed mobility comments: S wtih use of rail, increased effort and time    Transfers Overall transfer level: Needs assistance Equipment used: None Transfers: Sit to/from Stand Sit to Stand: Min guard         General transfer comment: min guard for safety, mildly tremulous due to weakness  Ambulation/Gait Ambulation/Gait assistance: Min  guard Gait Distance (Feet): 40 Feet Assistive device: None Gait Pattern/deviations: Step-through pattern;Decreased step length - right;Decreased step length - left;Trendelenburg;Drifts right/left;Trunk flexed Gait velocity: decreased   General Gait Details: slow and mildly unsteady, does become tremulous as fatigue increases and would benefit from RW/BUE support  Stairs            Wheelchair Mobility    Modified Rankin (Stroke Patients Only)       Balance Overall balance assessment: Needs assistance;History of Falls Sitting-balance support: Feet supported;No upper extremity supported Sitting balance-Leahy Scale: Good     Standing balance support: No upper extremity supported;During functional activity Standing balance-Leahy Scale: Fair Standing balance comment: close min guard for safety                             Pertinent Vitals/Pain Pain Assessment: No/denies pain    Home Living Family/patient expects to be discharged to:: Private residence Living Arrangements: Alone Available Help at Discharge: Family;Available PRN/intermittently (son and his wife live very close by) Type of Home: House Home Access: Stairs to enter Entrance Stairs-Rails: Psychiatric nurse of Steps: 2 STE on the front porch Home Layout: One level Home Equipment: Walker - 2 wheels;Grab bars - tub/shower;Grab bars - toilet;Shower seat Additional Comments: has had a hard time stepping into tub recently and has been taking bird baths at the sink. Has RW but does not use it    Prior Function Level of Independence: Independent         Comments: does well, having issues getting groceries in and out of the house (grocery shopping  takes a lot of energy).[place] about a week ago and hurt her R arm, also fell on porch bringing in groceries     Hand Dominance        Extremity/Trunk Assessment   Upper Extremity Assessment Upper Extremity Assessment: Generalized  weakness    Lower Extremity Assessment Lower Extremity Assessment: Generalized weakness    Cervical / Trunk Assessment Cervical / Trunk Assessment: Kyphotic  Communication   Communication: No difficulties  Cognition Arousal/Alertness: Awake/alert Behavior During Therapy: WFL for tasks assessed/performed Overall Cognitive Status: Impaired/Different from baseline Area of Impairment: Safety/judgement;Awareness;Problem solving                         Safety/Judgement: Decreased awareness of safety;Decreased awareness of deficits Awareness: Intellectual Problem Solving: Slow processing;Difficulty sequencing;Requires verbal cues General Comments: definite decreased awareness and judgement, tells me she wishes she could be home to go to her grandchild's birthday party saturday and was surprised when I told her she couldn't due to having Covid. Tells me she hopes she did not infect her daughter in law and was surprised to learn my recommendation that her familiy get tested as a precaution. Poor problem solving with lines/novel situations in room. Question how close she is to baseline however.      General Comments      Exercises     Assessment/Plan    PT Assessment Patient needs continued PT services  PT Problem List Decreased strength;Obesity;Decreased activity tolerance;Decreased safety awareness;Decreased balance;Decreased mobility;Decreased coordination;Cardiopulmonary status limiting activity       PT Treatment Interventions DME instruction;Balance training;Gait training;Stair training;Cognitive remediation;Functional mobility training;Patient/family education;Therapeutic activities;Therapeutic exercise    PT Goals (Current goals can be found in the Care Plan section)  Acute Rehab PT Goals Patient Stated Goal: go home PT Goal Formulation: With patient Time For Goal Achievement: 12/01/20 Potential to Achieve Goals: Good    Frequency Min 3X/week   Barriers to  discharge        Co-evaluation               AM-PAC PT "6 Clicks" Mobility  Outcome Measure Help needed turning from your back to your side while in a flat bed without using bedrails?: A Little Help needed moving from lying on your back to sitting on the side of a flat bed without using bedrails?: A Little Help needed moving to and from a bed to a chair (including a wheelchair)?: A Little Help needed standing up from a chair using your arms (e.g., wheelchair or bedside chair)?: A Little Help needed to walk in hospital room?: A Little Help needed climbing 3-5 steps with a railing? : A Lot 6 Click Score: 17    End of Session   Activity Tolerance: Patient tolerated treatment well Patient left: in bed;with call bell/phone within reach (bed in chair position) Nurse Communication: Mobility status PT Visit Diagnosis: Unsteadiness on feet (R26.81);Muscle weakness (generalized) (M62.81);History of falling (Z91.81)    Time: 1420-1446 PT Time Calculation (min) (ACUTE ONLY): 26 min   Charges:   PT Evaluation $PT Eval Moderate Complexity: 1 Mod PT Treatments $Gait Training: 8-22 mins        Windell Norfolk, DPT, PN1   Supplemental Physical Therapist [place]    Pager [phone] Acute Rehab Office [phone]

## 2020-11-17 NOTE — Progress Notes (Signed)
Cardiac Monitoring Event  Dysrhythmia:  Heart rate 140  Symptoms: feel like heart is racing, SOB   Level of Consciousness: A & O X4  Last set of vital signs taken:  Temp: 98.4 F (36.9 C)  Pulse Rate: (!) 101  Resp: 19  SpO2: 100%  Name of MD Notified:  DR.SIngh  Time MD Notified:  1140  Comments/Actions Taken:  Orders placed, continue to monitor patient.

## 2020-11-18 LAB — COMPREHENSIVE METABOLIC PANEL
ALT: 22 U/L (ref 0–44)
AST: 27 U/L (ref 15–41)
Albumin: 2.2 g/dL — ABNORMAL LOW (ref 3.5–5.0)
Alkaline Phosphatase: 121 U/L (ref 38–126)
Anion gap: 9 (ref 5–15)
BUN: 39 mg/dL — ABNORMAL HIGH (ref 8–23)
CO2: 19 mmol/L — ABNORMAL LOW (ref 22–32)
Calcium: 8 mg/dL — ABNORMAL LOW (ref 8.9–10.3)
Chloride: 100 mmol/L (ref 98–111)
Creatinine, Ser: 2.62 mg/dL — ABNORMAL HIGH (ref 0.44–1.00)
GFR, Estimated: 19 mL/min — ABNORMAL LOW (ref >60)
Glucose, Bld: 98 mg/dL (ref 70–99)
Potassium: 5.1 mmol/L (ref 3.5–5.1)
Sodium: 128 mmol/L — ABNORMAL LOW (ref 135–145)
Total Bilirubin: 0.7 mg/dL (ref 0.3–1.2)
Total Protein: 4.5 g/dL — ABNORMAL LOW (ref 6.5–8.1)

## 2020-11-18 LAB — MAGNESIUM: Magnesium: 1.6 mg/dL — ABNORMAL LOW (ref 1.7–2.4)

## 2020-11-18 LAB — CBC WITH DIFFERENTIAL/PLATELET
Abs Immature Granulocytes: 0.06 10*3/uL (ref 0.00–0.07)
Basophils Absolute: 0 10*3/uL (ref 0.0–0.1)
Basophils Relative: 0 %
Eosinophils Absolute: 0.5 10*3/uL (ref 0.0–0.5)
Eosinophils Relative: 5 %
HCT: 28.5 % — ABNORMAL LOW (ref 36.0–46.0)
Hemoglobin: 8.9 g/dL — ABNORMAL LOW (ref 12.0–15.0)
Immature Granulocytes: 1 %
Lymphocytes Relative: 32 %
Lymphs Abs: 3.1 10*3/uL (ref 0.7–4.0)
MCH: 25.1 pg — ABNORMAL LOW (ref 26.0–34.0)
MCHC: 31.2 g/dL (ref 30.0–36.0)
MCV: 80.5 fL (ref 80.0–100.0)
Monocytes Absolute: 1 10*3/uL (ref 0.1–1.0)
Monocytes Relative: 10 %
Neutro Abs: 5.1 10*3/uL (ref 1.7–7.7)
Neutrophils Relative %: 52 %
Platelets: 254 10*3/uL (ref 150–400)
RBC: 3.54 MIL/uL — ABNORMAL LOW (ref 3.87–5.11)
RDW: 18.1 % — ABNORMAL HIGH (ref 11.5–15.5)
WBC: 9.8 10*3/uL (ref 4.0–10.5)
nRBC: 0 % (ref 0.0–0.2)

## 2020-11-18 LAB — GLUCOSE, CAPILLARY
Glucose-Capillary: 89 mg/dL (ref 70–99)
Glucose-Capillary: 105 mg/dL — ABNORMAL HIGH (ref 70–99)
Glucose-Capillary: 99 mg/dL (ref 70–99)
Glucose-Capillary: 87 mg/dL (ref 70–99)

## 2020-11-18 LAB — D-DIMER, QUANTITATIVE: D-Dimer, Quant: 3.29 ug/mL-FEU — ABNORMAL HIGH (ref 0.00–0.50)

## 2020-11-18 LAB — BRAIN NATRIURETIC PEPTIDE: B Natriuretic Peptide: 186.9 pg/mL — ABNORMAL HIGH (ref 0.0–100.0)

## 2020-11-18 LAB — C-REACTIVE PROTEIN: CRP: 1.2 mg/dL — ABNORMAL HIGH (ref <1.0)

## 2020-11-18 MED ORDER — SODIUM ZIRCONIUM CYCLOSILICATE 10 G PO PACK
10.0000 g | PACK | Freq: Two times a day (BID) | ORAL | Status: AC
  Administered 2020-11-18 – 2020-11-19 (×2): 10 g via ORAL
  Filled 2020-11-18 (×2): qty 1

## 2020-11-18 MED ORDER — SODIUM BICARBONATE 650 MG PO TABS
650.0000 mg | ORAL_TABLET | Freq: Three times a day (TID) | ORAL | Status: DC
  Administered 2020-11-18 – 2020-11-23 (×15): 650 mg via ORAL
  Filled 2020-11-18 (×15): qty 1

## 2020-11-18 MED ORDER — LACTATED RINGERS IV SOLN: INTRAVENOUS | Status: DC

## 2020-11-18 MED ORDER — MAGNESIUM SULFATE 4 GM/100ML IV SOLN
4.0000 g | Freq: Once | INTRAVENOUS | Status: AC
  Administered 2020-11-18: 4 g via INTRAVENOUS
  Filled 2020-11-18: qty 100

## 2020-11-18 NOTE — Progress Notes (Signed)
PROGRESS NOTE                                                                                                                                                                                                             Patient Demographics:    [name], is a 75 y.o. female, DOB - 1946/04/25, QPR:[id number]  Outpatient Primary MD for the patient is Leanna Battles, MD    LOS - 2  Admit date - 11/15/2020    Chief Complaint  Patient presents with  . Vomiting       Brief Narrative (HPI from H&P) [name] is a 75 y.o. 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  She was found to have AKI hyperkalemia, dehydration and admitted to the hospital.    Subjective:   Patient in bed, appears comfortable, denies any headache, no fever, no chest pain or pressure, no shortness of breath , ? mild abdominal pain. No focal weakness. Still has ++ ileostomy output.   Assessment  & Plan :     1. Abd Pain, acute on chronic diarrhea, has history of high output ileostomy. Hydrate with IV fluids, on scheduled Creon, Lomotil and as needed Imodium, 2 doses of Tincher of opium.  CT abdomen pelvis nonacute exam benign.  Abdominal pain much improved.  Will check lipase with a.m. labs on 11/19/2020.   2. Diarrhea induced severe dehydration, hyponatremia, AKI, hyperkalemia, non-anion gap metabolic acidosis.  Admit of diarrhea as above, Oral bicarb, stop Kayexalate.  Monitor. HyperKalemia has resolved.  AKI improving have underlying CKD 3B with baseline creatinine around 1.5.  3. Acute Covid 19 infection  - CXR stable, no SOB, monitor, fully  Vaccinated- boosted.  Encouraged the patient to sit up in chair in the daytime use I-S and flutter valve for pulmonary toiletry and then prone in bed when at night.  Will advance activity and titrate down oxygen as possible.   Recent Labs  Lab 11/15/20 1153 11/16/20 0111 11/16/20 0300 11/16/20 0936 11/17/20 0706 11/17/20 0734 11/18/20 0713  WBC 11.8*  --  19.5*  --  10.0  --  9.8  HGB 10.7*  --  10.5*  --  8.6*  --  8.9*  HCT 33.8*  --  33.4*  --  26.2*  --  28.5*  PLT 288  --  268  --  233  --  254  CRP  --   --   --   --  1.1*  --  1.2*  BNP  --   --   --   --   --  332.9* 186.9*  DDIMER  --   --   --   --  2.61*  --  3.29*  AST 43*  --   --   --   --   --  27  ALT 24  --   --   --   --   --  22  ALKPHOS 139*  --   --   --   --   --  121  BILITOT 0.9  --   --   --   --   --  0.7  ALBUMIN 2.9*  --   --   --   --   --  2.2*  LATICACIDVEN  --  5.5*  --   --   --   --   --   SARSCOV2NAA  --   --   --  POSITIVE*  --   --   --     4.  History of bowel ischemia requiring small bowel resection and how output ileostomy.  Supportive care, ostomy nurses following.  5.  History of seizures.  Due to ongoing diarrhea and questionable absorption for now IV Keppra instead of oral.  6.  Sinus tachycardia and hypertension.  On beta-blocker Norvasc monitor.  Hydrate.  7.  History of CAD.  On beta-blocker, dual antiplatelet therapy and Lipitor, no acute issues.  8.  Hypomagnesemia.  Replaced IV 4gm.  9. HTN - on Lopressor, monitor.       Condition - Extremely Guarded  Family Communication  :  Daughter [name] - [phone], 11/17/20, 11/18/20  Code Status :  Full  Consults  :  None   Procedures  :      Renal US - Non acute  CT - Right lower abdominal wall ileostomy with adjacent parastomal hernia. No definite evidence of obstruction. Punctate nonobstructing left renal calculi Aortic Atherosclerosis (ICD10-I70.0).      PUD Prophylaxis : PPI  Disposition Plan  :    Status is:  Inpatient  Remains inpatient appropriate because:IV treatments appropriate due to intensity of illness or inability to take PO   Dispo: The patient is from: Home              Anticipated d/c is to: Home              Anticipated d/c date is: 3 days              Patient currently is not medically stable to d/c.   Difficult to place patient No   DVT Prophylaxis  :  Heparin    Lab Results  Component Value Date   PLT 254 11/18/2020    Diet :  Diet Order            DIET SOFT Room service appropriate? Yes; Fluid consistency: Thin  Diet effective now                  Inpatient Medications  Scheduled Meds: . amLODipine  10 mg  Oral Daily  . ARIPiprazole  5 mg Oral Daily  . atorvastatin  40 mg Oral Daily  . clopidogrel  75 mg Oral Daily  . diphenoxylate-atropine  1 tablet Oral TID  . feeding supplement  237 mL Oral BID BM  . heparin  5,000 Units Subcutaneous Q8H  . insulin aspart  0-5 Units Subcutaneous QHS  . insulin aspart  0-9 Units Subcutaneous TID WC  . lipase/protease/amylase  36,000 Units Oral Daily  . metoprolol tartrate  100 mg Oral BID  . multivitamin with minerals  1 tablet Oral Daily  . pantoprazole  40 mg Oral BID  . sodium bicarbonate  650 mg Oral TID   Continuous Infusions: . lactated ringers    . levETIRAcetam 500 mg (11/18/20 0102)  . magnesium sulfate bolus IVPB     PRN Meds:.acetaminophen **OR** [DISCONTINUED] acetaminophen, cyclobenzaprine, loperamide, melatonin, metoprolol tartrate, [DISCONTINUED] ondansetron **OR** ondansetron (ZOFRAN) IV, traMADol  Antibiotics  :    Anti-infectives (From admission, onward)   None       Time Spent in minutes  30   Lala Lund M.D on 11/18/2020 at 10:08 AM  To page go to [email or web address]   [facility] -  Office  [phone]    See all Orders from today for further details    Objective:   Vitals:   11/17/20 1717 11/17/20 2107 11/18/20 0300 11/18/20 0817  BP: 110/70 129/62 119/60 (!) 171/69   Pulse: 69 71 (!) 53 (!) 53  Resp: 19   16  Temp: 98.3 F (36.8 C) 97.7 F (36.5 C) 98 F (36.7 C) 97.8 F (36.6 C)  TempSrc:  Oral Oral   SpO2: 100% 99% 96% 100%  Weight:  78.4 kg    Height:        Wt Readings from Last 3 Encounters:  11/17/20 78.4 kg  08/08/20 80.4 kg  06/27/20 78.5 kg     Intake/Output Summary (Last 24 hours) at 11/18/2020 1008 Last data filed at 11/18/2020 0254 Gross per 24 hour  Intake 1728.63 ml  Output 1750 ml  Net -21.37 ml     Physical Exam  Awake Alert, No new F.N deficits, Normal affect [place].AT,PERRAL Supple Neck,No JVD, No cervical lymphadenopathy appriciated.  Symmetrical Chest wall movement, Good air movement bilaterally, CTAB RRR,No Gallops, Rubs or new Murmurs, No Parasternal Heave +ve B.Sounds, Abd Soft, No tenderness, R. Sided ileostomy bag in place No Cyanosis, Clubbing or edema, No new Rash or bruise     Data Review:    CBC Recent Labs  Lab 11/15/20 1153 11/16/20 0300 11/17/20 0706 11/18/20 0713  WBC 11.8* 19.5* 10.0 9.8  HGB 10.7* 10.5* 8.6* 8.9*  HCT 33.8* 33.4* 26.2* 28.5*  PLT 288 268 233 254  MCV 80.1 80.3 77.3* 80.5  MCH 25.4* 25.2* 25.4* 25.1*  MCHC 31.7 31.4 32.8 31.2  RDW 17.8* 17.9* 17.9* 18.1*  LYMPHSABS  --   --  2.4 3.1  MONOABS  --   --  0.9 1.0  EOSABS  --   --  0.4 0.5  BASOSABS  --   --  0.0 0.0    Recent Labs  Lab 11/15/20 1153 11/16/20 0111 11/16/20 0300 11/16/20 1316 11/17/20 0706 11/17/20 0734 11/17/20 1218 11/18/20 0713  NA 124* 124* 128* 127* 128*  --   --  128*  K 6.0* 5.9* 5.9* 5.3* 4.7  --   --  5.1  CL 98 97* 102 98 100  --   --  100  CO2 13* 12*  14* 18* 20*  --   --  19*  GLUCOSE 126* 131* 51* 92 91  --   --  98  BUN 47* 56* 52* 46* 41*  --   --  39*  CREATININE 3.29* 4.26* 3.77* 3.41* 3.27*  --   --  2.62*  CALCIUM 8.8* 9.4 9.4 8.5* 8.0*  --   --  8.0*  AST 43*  --   --   --   --   --   --  27  ALT 24  --   --   --   --   --   --  22  ALKPHOS 139*  --   --   --   --   --    --  121  BILITOT 0.9  --   --   --   --   --   --  0.7  ALBUMIN 2.9*  --   --   --   --   --   --  2.2*  MG  --   --   --   --  1.2*  --   --  1.6*  CRP  --   --   --   --  1.1*  --   --  1.2*  DDIMER  --   --   --   --  2.61*  --   --  3.29*  LATICACIDVEN  --  5.5*  --   --   --   --   --   --   TSH  --   --   --   --   --   --  0.775  --   HGBA1C  --   --   --   --  6.0*  --   --   --   BNP  --   --   --   --   --  332.9*  --  186.9*    ------------------------------------------------------------------------------------------------------------------ No results for input(s): CHOL, HDL, LDLCALC, TRIG, CHOLHDL, LDLDIRECT in the last 72 hours.  Lab Results  Component Value Date   HGBA1C 6.0 (H) 11/17/2020   ------------------------------------------------------------------------------------------------------------------ Recent Labs    11/17/20 1218  TSH 0.775    Cardiac Enzymes No results for input(s): CKMB, TROPONINI, MYOGLOBIN in the last 168 hours.  Invalid input(s): CK ------------------------------------------------------------------------------------------------------------------    Component Value Date/Time   BNP 186.9 (H) 11/18/2020 0762    Micro Results Recent Results (from the past 240 hour(s))  SARS CORONAVIRUS 2 (TAT 6-24 HRS) Nasopharyngeal     Status: Abnormal   Collection Time: 11/16/20  9:36 AM   Specimen: Nasopharyngeal  Result Value Ref Range Status   SARS Coronavirus 2 POSITIVE (A) NEGATIVE Final    Comment: (NOTE) SARS-CoV-2 target nucleic acids are DETECTED.  The SARS-CoV-2 RNA is generally detectable in upper and lower respiratory specimens during the acute phase of infection. Positive results are indicative of the presence of SARS-CoV-2 RNA. Clinical correlation with patient history and other diagnostic information is  necessary to determine patient infection status. Positive results do not rule out bacterial infection or co-infection with other  viruses.  The expected result is Negative.  Fact Sheet for Patients: SugarRoll.be  Fact Sheet for Healthcare Providers: https://www.woods-mathews.com/  This test is not yet approved or cleared by the [place] FDA and  has been authorized for detection and/or diagnosis of SARS-CoV-2 by FDA under an Emergency Use Authorization (EUA). This EUA will remain  in effect (  meaning this test can be used) for the duration of the COVID-19 declaration under Section 564(b)(1) of the Act, 21 U. S.C. section 360bbb-3(b)(1), unless the authorization is terminated or revoked sooner.   Performed at [facility], [place] [address], [place], [place] [address]     Radiology Reports CT ABDOMEN PELVIS WO CONTRAST  Result Date: 11/16/2020 CLINICAL DATA:  Abdominal pain nausea EXAM: CT ABDOMEN AND PELVIS WITHOUT CONTRAST TECHNIQUE: Multidetector CT imaging of the abdomen and pelvis was performed following the standard protocol without IV contrast. COMPARISON:  August 23rd 2021 FINDINGS: Lower chest: The visualized heart size within normal limits. No pericardial fluid/thickening. There is a small hiatal hernia present. The visualized portions of the lungs are clear. Hepatobiliary: Although limited due to the lack of intravenous contrast, normal in appearance without gross focal abnormality. No evidence of calcified gallstones or biliary ductal dilatation. Pancreas:  Unremarkable.  No surrounding inflammatory changes. Spleen: Normal in size. Although limited due to the lack of intravenous contrast, normal in appearance. Adrenals/Urinary Tract: Both adrenal glands appear normal. There is a punctate calcifications seen within the upper pole of the left kidney. No right-sided renal or collecting system calculi are seen. Bladder is unremarkable. Stomach/Bowel: A small hiatal hernia is present, however otherwise unremarkable stomach is seen. A right anterior abdominal wall  ileostomy is noted. There is been a prior right hemicolectomy wound seen. No dilated loops of bowel are noted. Vascular/Lymphatic: There are no enlarged abdominal or pelvic lymph nodes. Scattered aortic atherosclerotic calcifications are seen without aneurysmal dilatation. Reproductive: The patient is status post hysterectomy. No adnexal masses or collections seen. Other: There are 2 fat containing the upper anterior abdominal wall hernia is present there is also a stable left anterior abdominal wall parastomal hernia containing loops small bowel. Musculoskeletal: No acute or significant osseous findings. Degenerative changes are seen throughout the lumbar spine. 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 No definite evidence of obstruction. Punctate nonobstructing left renal calculi Aortic Atherosclerosis (ICD10-I70.0). Electronically Signed   By: Prudencio Pair M.D.   On: 11/16/2020 00:59   US RENAL  Result Date: 11/16/2020 CLINICAL DATA:  Acute kidney injury EXAM: RENAL / URINARY TRACT ULTRASOUND COMPLETE COMPARISON:  CT 11/16/2020, ultrasound 09/12/2016 FINDINGS: Right Kidney: Renal measurements: 9.1 x 4.0 x 4.0 cm = volume: 75 mL. Echogenicity is within normal limits. No concerning renal mass, shadowing calculus or hydronephrosis. Left Kidney: Renal measurements: 9.2 x 5.2 x 3.5 cm = volume: 86 mL. Echogenicity is within normal limits. No concerning renal mass, visible shadowing calculus or hydronephrosis. Few punctate calcifications seen in the upper pole left kidney on CT imaging are not well visualized on this sonographic exam. Bladder: Appears normal for degree of bladder distention. Other: None. IMPRESSION: Unremarkable renal ultrasound. Previously seen punctate upper pole left renal calculi are not well visualized. Electronically Signed   By: Lovena Le M.D.   On: 11/16/2020 03:23   DG CHEST PORT 1 VIEW  Result Date: 11/17/2020 CLINICAL DATA:  COVID-19  positivity EXAM: PORTABLE CHEST 1 VIEW COMPARISON:  10/21/2019 FINDINGS: Cardiac shadow is within normal limits. Aortic calcifications are seen. The lungs are well aerated bilaterally. No focal infiltrate is seen. No bony abnormality is noted. IMPRESSION: No acute abnormality noted. Electronically Signed   By: Inez Catalina M.D.   On: 11/17/2020 01:59   ECHOCARDIOGRAM LIMITED  Result Date: 11/17/2020    ECHOCARDIOGRAM LIMITED REPORT   Patient Name:   LYRIQUE HAKIM Date of  Exam: 11/17/2020 Medical Rec #:  [id number]      Height:       63.0 in Accession #:    [id number]     Weight:       173.1 lb Date of Birth:  [birth year]      BSA:          1.818 m Patient Age:    66 years       BP:           159/66 mmHg Patient Gender: F              HR:           101 bpm. Exam Location:  Inpatient Procedure: Cardiac Doppler, Color Doppler, Limited Echo and Intracardiac            Opacification Agent Indications:    CHF-Acute Diastolic  History:        Patient has prior history of Echocardiogram examinations. Risk                 Factors:Former Smoker. CKD.  Sonographer:    Clayton Lefort RDCS (AE) Referring Phys: [id number] [address]are[address]intosh [place]  1. Left ventricular ejection fraction, by estimation, is 45 to 50%. The left ventricle has mildly decreased function. The left ventricle demonstrates regional wall motion abnormalities (see scoring diagram/findings for description). There is moderate left ventricular hypertrophy.  2. The mitral valve was not well visualized. No evidence of mitral valve regurgitation.  3. The aortic valve is tricuspid. Mild aortic valve sclerosis is present, with no evidence of aortic valve stenosis. Comparison(s): Prior images reviewed side by side. Changes from prior study are noted. FINDINGS  Left Ventricle: Left ventricular ejection fraction, by estimation, is 45 to 50%. The left ventricle has mildly decreased function. The left ventricle demonstrates regional wall motion abnormalities. Definity contrast  agent was given IV to delineate the left ventricular endocardial borders. There is moderate left ventricular hypertrophy.  LV Wall Scoring: The entire apex is akinetic. Mitral Valve: The mitral valve was not well visualized. Tricuspid Valve: The tricuspid valve is not well visualized. Tricuspid valve regurgitation is not demonstrated. Aortic Valve: The aortic valve is tricuspid. Mild aortic valve sclerosis is present, with no evidence of aortic valve stenosis. Aortic valve mean gradient measures 2.0 mmHg. Aortic valve peak gradient measures 4.8 mmHg. Aortic valve area, by VTI measures  2.01 cm. LEFT VENTRICLE PLAX 2D LVIDd:         4.10 cm  Diastology LVIDs:         2.50 cm  LV e' medial:    3.05 cm/s LV PW:         1.50 cm  LV E/e' medial:  17.1 LV IVS:        1.60 cm  LV e' lateral:   6.64 cm/s LVOT diam:     2.00 cm  LV E/e' lateral: 7.8 LV SV:         39 LV SV Index:   21 LVOT Area:     3.14 cm  IVC IVC diam: 1.50 cm LEFT ATRIUM         Index LA diam:    2.90 cm 1.59 cm/m  AORTIC VALVE AV Area (Vmax):    2.08 cm AV Area (Vmean):   2.22 cm AV Area (VTI):     2.01 cm AV Vmax:           109.00 cm/s AV Vmean:  62.200 cm/s AV VTI:            0.192 m AV Peak Grad:      4.8 mmHg AV Mean Grad:      2.0 mmHg LVOT Vmax:         72.10 cm/s LVOT Vmean:        44.000 cm/s LVOT VTI:          0.123 m LVOT/AV VTI ratio: 0.64  AORTA Ao Root diam: 2.90 cm Ao Asc diam:  3.10 cm MITRAL VALVE MV Area (PHT): 2.83 cm     SHUNTS MV Decel Time: 268 msec     Systemic VTI:  0.12 m MV E velocity: 52.10 cm/s   Systemic Diam: 2.00 cm MV A velocity: 114.00 cm/s MV E/A ratio:  0.46 Candee Furbish MD Electronically signed by Candee Furbish MD Signature Date/Time: 11/17/2020/5:06:53 PM    Final

## 2020-11-18 NOTE — Plan of Care (Signed)

## 2020-11-19 ENCOUNTER — Inpatient Hospital Stay (HOSPITAL_COMMUNITY): Payer: 59

## 2020-11-19 DIAGNOSIS — R7989 Other specified abnormal findings of blood chemistry: Secondary | ICD-10-CM | POA: Diagnosis not present

## 2020-11-19 DIAGNOSIS — U071 COVID-19: Secondary | ICD-10-CM

## 2020-11-19 LAB — CBC WITH DIFFERENTIAL/PLATELET
Abs Immature Granulocytes: 0.07 10*3/uL (ref 0.00–0.07)
Basophils Absolute: 0 10*3/uL (ref 0.0–0.1)
Basophils Relative: 0 %
Eosinophils Absolute: 0.3 10*3/uL (ref 0.0–0.5)
Eosinophils Relative: 2 %
HCT: 33.2 % — ABNORMAL LOW (ref 36.0–46.0)
Hemoglobin: 10.7 g/dL — ABNORMAL LOW (ref 12.0–15.0)
Immature Granulocytes: 1 %
Lymphocytes Relative: 7 %
Lymphs Abs: 1.1 10*3/uL (ref 0.7–4.0)
MCH: 25.6 pg — ABNORMAL LOW (ref 26.0–34.0)
MCHC: 32.2 g/dL (ref 30.0–36.0)
MCV: 79.4 fL — ABNORMAL LOW (ref 80.0–100.0)
Monocytes Absolute: 0.5 10*3/uL (ref 0.1–1.0)
Monocytes Relative: 3 %
Neutro Abs: 13.1 10*3/uL — ABNORMAL HIGH (ref 1.7–7.7)
Neutrophils Relative %: 87 %
Platelets: 325 10*3/uL (ref 150–400)
RBC: 4.18 MIL/uL (ref 3.87–5.11)
RDW: 18.6 % — ABNORMAL HIGH (ref 11.5–15.5)
WBC: 15 10*3/uL — ABNORMAL HIGH (ref 4.0–10.5)
nRBC: 0 % (ref 0.0–0.2)

## 2020-11-19 LAB — COMPREHENSIVE METABOLIC PANEL
ALT: 21 U/L (ref 0–44)
AST: 35 U/L (ref 15–41)
Albumin: 2.2 g/dL — ABNORMAL LOW (ref 3.5–5.0)
Alkaline Phosphatase: 137 U/L — ABNORMAL HIGH (ref 38–126)
Anion gap: 11 (ref 5–15)
BUN: 26 mg/dL — ABNORMAL HIGH (ref 8–23)
CO2: 20 mmol/L — ABNORMAL LOW (ref 22–32)
Calcium: 8.2 mg/dL — ABNORMAL LOW (ref 8.9–10.3)
Chloride: 97 mmol/L — ABNORMAL LOW (ref 98–111)
Creatinine, Ser: 1.83 mg/dL — ABNORMAL HIGH (ref 0.44–1.00)
GFR, Estimated: 29 mL/min — ABNORMAL LOW (ref >60)
Glucose, Bld: 87 mg/dL (ref 70–99)
Potassium: 4 mmol/L (ref 3.5–5.1)
Sodium: 128 mmol/L — ABNORMAL LOW (ref 135–145)
Total Bilirubin: 0.7 mg/dL (ref 0.3–1.2)
Total Protein: 4.9 g/dL — ABNORMAL LOW (ref 6.5–8.1)

## 2020-11-19 LAB — BRAIN NATRIURETIC PEPTIDE: B Natriuretic Peptide: 446.6 pg/mL — ABNORMAL HIGH (ref 0.0–100.0)

## 2020-11-19 LAB — GLUCOSE, CAPILLARY
Glucose-Capillary: 51 mg/dL — ABNORMAL LOW (ref 70–99)
Glucose-Capillary: 63 mg/dL — ABNORMAL LOW (ref 70–99)
Glucose-Capillary: 65 mg/dL — ABNORMAL LOW (ref 70–99)
Glucose-Capillary: 86 mg/dL (ref 70–99)
Glucose-Capillary: 77 mg/dL (ref 70–99)
Glucose-Capillary: 73 mg/dL (ref 70–99)
Glucose-Capillary: 88 mg/dL (ref 70–99)

## 2020-11-19 LAB — D-DIMER, QUANTITATIVE: D-Dimer, Quant: 6.12 ug/mL-FEU — ABNORMAL HIGH (ref 0.00–0.50)

## 2020-11-19 LAB — C-REACTIVE PROTEIN: CRP: 4.7 mg/dL — ABNORMAL HIGH (ref <1.0)

## 2020-11-19 LAB — LIPASE, BLOOD: Lipase: 718 U/L — ABNORMAL HIGH (ref 11–51)

## 2020-11-19 LAB — MAGNESIUM: Magnesium: 2 mg/dL (ref 1.7–2.4)

## 2020-11-19 MED ORDER — OPIUM 10 MG/ML (1%) PO TINC
1.0000 mL | Freq: Once | ORAL | Status: AC
  Administered 2020-11-19: 10 mg via ORAL
  Filled 2020-11-19: qty 1

## 2020-11-19 MED ORDER — ENOXAPARIN SODIUM 40 MG/0.4ML ~~LOC~~ SOLN
40.0000 mg | Freq: Two times a day (BID) | SUBCUTANEOUS | Status: DC
  Administered 2020-11-19 (×2): 40 mg via SUBCUTANEOUS
  Filled 2020-11-19 (×2): qty 0.4

## 2020-11-19 MED ORDER — SODIUM CHLORIDE 1 G PO TABS
1.0000 g | ORAL_TABLET | Freq: Three times a day (TID) | ORAL | Status: AC
  Administered 2020-11-19 – 2020-11-20 (×3): 1 g via ORAL
  Filled 2020-11-19 (×3): qty 1

## 2020-11-19 MED ORDER — SODIUM ZIRCONIUM CYCLOSILICATE 10 G PO PACK
10.0000 g | PACK | Freq: Two times a day (BID) | ORAL | Status: AC
  Administered 2020-11-19 (×2): 10 g via ORAL
  Filled 2020-11-19 (×2): qty 1

## 2020-11-19 MED ORDER — LACTATED RINGERS IV SOLN: INTRAVENOUS | Status: DC

## 2020-11-19 MED ORDER — HYDRALAZINE HCL 20 MG/ML IJ SOLN
5.0000 mg | INTRAMUSCULAR | Status: DC | PRN
  Administered 2020-11-19: 5 mg via INTRAVENOUS
  Filled 2020-11-19: qty 1

## 2020-11-19 MED ORDER — MAGNESIUM SULFATE 2 GM/50ML IV SOLN
2.0000 g | Freq: Once | INTRAVENOUS | Status: AC
  Administered 2020-11-19: 2 g via INTRAVENOUS
  Filled 2020-11-19: qty 50

## 2020-11-19 MED ORDER — HYDRALAZINE HCL 50 MG PO TABS
50.0000 mg | ORAL_TABLET | Freq: Three times a day (TID) | ORAL | Status: DC
  Administered 2020-11-19 – 2020-11-23 (×12): 50 mg via ORAL
  Filled 2020-11-19 (×12): qty 1

## 2020-11-19 MED ORDER — GLUCOSE 40 % PO GEL
ORAL | Status: AC
  Administered 2020-11-19: 37.5 g
  Filled 2020-11-19: qty 1

## 2020-11-19 MED ORDER — GLUCOSE 4 G PO CHEW
CHEWABLE_TABLET | ORAL | Status: AC
  Filled 2020-11-19: qty 1

## 2020-11-19 NOTE — Progress Notes (Signed)
HYPOGLYCEMIC EVENT:  @0649  - CBG 51 @0709  - dextrose (glutose) 40% oral gel administered @0736  - CBG 65   NT notified RN. Breakfast in front of patient. She is currently eating. CBG to be re-check after breakfast.

## 2020-11-19 NOTE — Evaluation (Signed)
Occupational Therapy Evaluation Patient Details Name: [name] MRN: [id number] DOB: Mar 14, 1946 Today's Date: 11/19/2020    History of Present Illness 75yo female presenting with 10 day hx of nausea and vomiting. Found to be covid positive. Admitted with metabolic acidosis, NAG, and failure of bicarbonate regeneration. PMH CAD, clotting disorder, DM, chronic dizziness, HLD, DOE, HTN, seizures, CVA, femoral artery stent, patella realignment   Clinical Impression   Pt was walking without a device, independent in self care (sponge bathing and going to beauty shop weekly for hair care) and driving prior to admission. She reports she would fatigue easily with attempt to cook or perform housework and with grocery shopping. Pt presents with memory deficits, decreased awareness of deficits and problem solving related to her poor activity tolerance. She requires min guard assist for OOB and toileting. Recommending home with increased presence and assistance of her family. Will follow acutely.     Follow Up Recommendations  Home health OT    Equipment Recommendations  None recommended by OT    Recommendations for Other Services       Precautions / Restrictions Precautions Precautions: Fall;Other (comment) Precaution Comments: colostomy, covid+      Mobility Bed Mobility Overal bed mobility: Modified Independent             General bed mobility comments: HOB up    Transfers Overall transfer level: Needs assistance Equipment used: Rolling walker (2 wheeled) Transfers: Sit to/from Stand Sit to Stand: Min guard         General transfer comment: min guard for safety    Balance Overall balance assessment: Needs assistance;History of Falls Sitting-balance support: Feet supported;No upper extremity supported Sitting balance-Leahy Scale: Good     Standing balance support: Single extremity supported Standing balance-Leahy Scale: Fair Standing balance comment: during  pericare                           ADL either performed or assessed with clinical judgement   ADL Overall ADL's : Needs assistance/impaired Eating/Feeding: Independent;Bed level   Grooming: Brushing hair;Set up;Sitting   Upper Body Bathing: Set up;Sitting   Lower Body Bathing: Sit to/from stand;Minimal assistance   Upper Body Dressing : Set up   Lower Body Dressing: Minimal assistance;Sit to/from stand   Toilet Transfer: Min guard;Ambulation;RW;BSC   Toileting- Water quality scientist and Hygiene: Min guard;Sit to/from stand Toileting - Clothing Manipulation Details (indicate cue type and reason): has colonoscopy     Functional mobility during ADLs: Min guard;Rolling walker       Vision Baseline Vision/History: Wears glasses Wears Glasses: Reading only Patient Visual Report: No change from baseline       Perception     Praxis      Pertinent Vitals/Pain Pain Assessment: No/denies pain     Hand Dominance Right   Extremity/Trunk Assessment Upper Extremity Assessment Upper Extremity Assessment: Overall WFL for tasks assessed       Cervical / Trunk Assessment Cervical / Trunk Assessment: Other exceptions (obese)   Communication Communication Communication: No difficulties   Cognition Arousal/Alertness: Awake/alert Behavior During Therapy: WFL for tasks assessed/performed Overall Cognitive Status: Impaired/Different from baseline Area of Impairment: Memory;Problem solving;Safety/judgement                     Memory: Decreased short-term memory   Safety/Judgement: Decreased awareness of safety;Decreased awareness of deficits   Problem Solving: Requires tactile cues General Comments: pt aware activities are difficult for her,  but does not ask for assistance appropriately from her family   General Comments       Exercises     Shoulder Instructions      Home Living Family/patient expects to be discharged to:: Private  residence Living Arrangements: Alone Available Help at Discharge: Family;Available PRN/intermittently (son and daughter in law live nearby) Type of Home: House Home Access: Stairs to enter Technical brewer of Steps: 2 Entrance Stairs-Rails: Right;Left Home Layout: One level     Bathroom Shower/Tub: Teacher, early years/pre: Handicapped height     Home Equipment: Environmental consultant - 2 wheels;Grab bars - tub/shower;Grab bars - toilet;Shower seat   Additional Comments: sponge bathes, goes to beauty shop for hair      Prior Functioning/Environment Level of Independence: Independent        Comments: recent hx of falls, was not using AD to ambulate, grocery shopping, but stated it would wear her out        OT Problem List: Decreased strength;Decreased activity tolerance;Impaired balance (sitting and/or standing);Decreased cognition;Decreased safety awareness;Decreased knowledge of use of DME or AE;Obesity;Cardiopulmonary status limiting activity      OT Treatment/Interventions: Self-care/ADL training;DME and/or AE instruction;Cognitive remediation/compensation;Balance training;Patient/family education    OT Goals(Current goals can be found in the care plan section) Acute Rehab OT Goals Patient Stated Goal: go home OT Goal Formulation: With patient Time For Goal Achievement: 12/03/20 Potential to Achieve Goals: Good ADL Goals Pt Will Perform Grooming: with modified independence;standing Pt Will Perform Lower Body Bathing: with modified independence;sit to/from stand Pt Will Perform Lower Body Dressing: with modified independence;sit to/from stand Pt Will Transfer to Toilet: with modified independence;ambulating Pt Will Perform Toileting - Clothing Manipulation and hygiene: with modified independence;sit to/from stand Additional ADL Goal #1: Pt will state at least 3 energy conservation strategies during ADL as instructed.  OT Frequency: Min 2X/week   Barriers to D/C:             Co-evaluation              AM-PAC OT "6 Clicks" Daily Activity     Outcome Measure Help from another person eating meals?: None Help from another person taking care of personal grooming?: A Little Help from another person toileting, which includes using toliet, bedpan, or urinal?: A Little Help from another person bathing (including washing, rinsing, drying)?: A Little Help from another person to put on and taking off regular upper body clothing?: None Help from another person to put on and taking off regular lower body clothing?: A Little 6 Click Score: 20   End of Session Equipment Utilized During Treatment: Rolling walker Nurse Communication: Other (comment) (aware she needs her colonoscopy emptied)  Activity Tolerance: Patient tolerated treatment well Patient left: in bed;with call bell/phone within reach;with bed alarm set  OT Visit Diagnosis: Unsteadiness on feet (R26.81);Other abnormalities of gait and mobility (R26.89);Muscle weakness (generalized) (M62.81);Other symptoms and signs involving cognitive function;Other (comment) (decreased activity tolerance)                Time: 2500-3704 OT Time Calculation (min): 13 min Charges:  OT General Charges $OT Visit: 1 Visit OT Evaluation $OT Eval Low Complexity: [place], OTR/L Acute Rehabilitation Services Pager: [phone] Office: [phone]  Malka So 11/19/2020, 3:45 PM

## 2020-11-19 NOTE — Progress Notes (Signed)
Lower extremity venous bilateral study completed.   Please see CV Proc for preliminary results.   Jatara Huettner, RDMS  

## 2020-11-19 NOTE — Progress Notes (Signed)
PROGRESS NOTE                                                                                                                                                                                                             Patient Demographics:    [name], is a 75 y.o. female, DOB - Jul 19, 1946, XIP:[id number]  Outpatient Primary MD for the patient is Leanna Battles, MD    LOS - 3  Admit date - 11/15/2020    Chief Complaint  Patient presents with  . Vomiting       Brief Narrative (HPI from H&P) [name] is a 75 y.o. 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  She was found to have AKI hyperkalemia, dehydration and admitted to the hospital.    Subjective:   Patient in bed, appears comfortable, denies any headache, no fever, no chest pain or pressure, no shortness of breath , ? mild abdominal pain. No focal weakness. Still has ++ ileostomy output.   Assessment  & Plan :     1. Abd Pain, acute on chronic diarrhea, has history of high output ileostomy. Hydrate with IV fluids, on scheduled Creon, Lomotil and as needed Imodium, 2 doses of Tincher of opium.  CT abdomen pelvis nonacute exam benign.  Abdominal pain much improved on 11/19/2020 but lipase is elevated, this could be nonspecific of chronic pancreatitis, she is on pancreatic enzymes on a chronic basis but denies any knowledge of chronic pancreatitis, will monitor clinically  Lipase     Component Value Date/Time   LIPASE 718 (H) 11/19/2020 0759     2. Diarrhea induced severe dehydration, hyponatremia, AKI, hyperkalemia, non-anion gap metabolic acidosis.   Management of diarrhea as above, AKI, hyperkalemia have resolved and acidosis is improving, underlying CKD 3B with baseline creatinine around 1.5.  Continue gentle IV fluid for hyponatremia.  3. Acute Covid 19 infection  - CXR stable, no SOB, monitor, fully Vaccinated- boosted.  Encouraged the patient to sit up in chair in the daytime use I-S and flutter valve for pulmonary toiletry and then  prone in bed when at night.  Will advance activity and titrate down oxygen as possible.   Recent Labs  Lab 11/15/20 1153 11/16/20 0111 11/16/20 0300 11/16/20 0936 11/17/20 0706 11/17/20 0734 11/18/20 0713 11/19/20 0759  WBC 11.8*  --  19.5*  --  10.0  --  9.8 15.0*  HGB 10.7*  --  10.5*  --  8.6*  --  8.9* 10.7*  HCT 33.8*  --  33.4*  --  26.2*  --  28.5* 33.2*  PLT 288  --  268  --  233  --  254 325  CRP  --   --   --   --  1.1*  --  1.2* 4.7*  BNP  --   --   --   --   --  332.9* 186.9* 446.6*  DDIMER  --   --   --   --  2.61*  --  3.29* 6.12*  AST 43*  --   --   --   --   --  27 35  ALT 24  --   --   --   --   --  22 21  ALKPHOS 139*  --   --   --   --   --  121 137*  BILITOT 0.9  --   --   --   --   --  0.7 0.7  ALBUMIN 2.9*  --   --   --   --   --  2.2* 2.2*  LATICACIDVEN  --  5.5*  --   --   --   --   --   --   SARSCOV2NAA  --   --   --  POSITIVE*  --   --   --   --      4.  History of bowel ischemia requiring small bowel resection and how output ileostomy.  Supportive care, ostomy nurses following.  5.  History of seizures.  Due to ongoing diarrhea and questionable absorption for now IV Keppra instead of oral.  6.  Sinus tachycardia and hypertension.  On beta-blocker Norvasc monitor.  Hydrate.  7.  History of CAD.  On beta-blocker, dual antiplatelet therapy and Lipitor, no acute issues.  8.  Hypomagnesemia.  Replaced IV 4gm & stable.  9. HTN - on Lopressor, DC Norvasc & monitor.  10.  Rising D-dimer, high risk for developing DVT, moderate dose Lovenox, check leg  ultrasound.       Condition - Extremely Guarded  Family Communication  :  Daughter [name] [phone], 11/17/20, 11/18/20, 11/19/20  Code Status :  Full  Consults  :  None   Procedures  :      TTE - 1. Left ventricular ejection fraction, by estimation, is 45 to 50%. The left ventricle has mildly decreased function. The left ventricle demonstrates regional wall motion abnormalities (see scoring diagram/findings for description). There is moderate left ventricular hypertrophy.  2. The mitral valve was not well visualized. No evidence of mitral valve regurgitation.  3. The aortic valve is tricuspid. Mild aortic valve sclerosis is present, with no evidence of aortic valve stenosis. Comparison(s): Prior images reviewed side by side. Changes from prior study are noted.  Renal US - Non acute.   CT - Right lower abdominal wall ileostomy with adjacent parastomal hernia. No definite evidence of obstruction. Punctate nonobstructing left renal calculi Aortic Atherosclerosis (ICD10-I70.0).      PUD Prophylaxis : PPI  Disposition Plan  :  Status is: Inpatient  Remains inpatient appropriate because:IV treatments appropriate due to intensity of illness or inability to take PO   Dispo: The patient is from: Home              Anticipated d/c is to: Home              Anticipated d/c date is: 3 days              Patient currently is not medically stable to d/c.   Difficult to place patient No   DVT Prophylaxis  :  Heparin    Lab Results  Component Value Date   PLT 325 11/19/2020    Diet :  Diet Order            DIET SOFT Room service appropriate? Yes; Fluid consistency: Thin  Diet effective now                  Inpatient Medications  Scheduled Meds: . glucose      . ARIPiprazole  5 mg Oral Daily  . atorvastatin  40 mg Oral Daily  . clopidogrel  75 mg Oral Daily  . diphenoxylate-atropine  1 tablet Oral TID  . feeding supplement  237 mL Oral BID BM  . heparin  5,000 Units  Subcutaneous Q8H  . hydrALAZINE  50 mg Oral Q8H  . insulin aspart  0-9 Units Subcutaneous TID WC  . lipase/protease/amylase  36,000 Units Oral Daily  . metoprolol tartrate  100 mg Oral BID  . multivitamin with minerals  1 tablet Oral Daily  . pantoprazole  40 mg Oral BID  . sodium bicarbonate  650 mg Oral TID  . sodium zirconium cyclosilicate  10 g Oral BID   Continuous Infusions: . lactated ringers 75 mL/hr at 11/19/20 0900  . levETIRAcetam 500 mg (11/19/20 0111)   PRN Meds:.acetaminophen **OR** [DISCONTINUED] acetaminophen, cyclobenzaprine, hydrALAZINE, loperamide, melatonin, metoprolol tartrate, [DISCONTINUED] ondansetron **OR** ondansetron (ZOFRAN) IV, traMADol  Antibiotics  :    Anti-infectives (From admission, onward)   None       Time Spent in minutes  30   Lala Lund M.D on 11/19/2020 at 10:47 AM  To page go to [email or web address]   [facility] -  Office  [phone]    See all Orders from today for further details    Objective:   Vitals:   11/19/20 0107 11/19/20 0113 11/19/20 0626 11/19/20 0832  BP: (!) 168/89 (!) 162/72 137/70 114/76  Pulse: 63 60 68 65  Resp:   16 18  Temp: (!) 97.5 F (36.4 C)  97.8 F (36.6 C) 97.8 F (36.6 C)  TempSrc: Oral     SpO2: 100%  100% 94%  Weight:      Height:        Wt Readings from Last 3 Encounters:  11/17/20 78.4 kg  08/08/20 80.4 kg  06/27/20 78.5 kg     Intake/Output Summary (Last 24 hours) at 11/19/2020 1047 Last data filed at 11/19/2020 1027 Gross per 24 hour  Intake 2231.98 ml  Output 1300 ml  Net 931.98 ml     Physical Exam  Awake Alert, No new F.N deficits, Normal affect [place].AT,PERRAL Supple Neck,No JVD, No cervical lymphadenopathy appriciated.  Symmetrical Chest wall movement, Good air movement bilaterally, CTAB RRR,No Gallops, Rubs or new Murmurs, No Parasternal Heave +ve B.Sounds, Abd Soft, No tenderness, R. Sided ileostomy bag in place. No Cyanosis, Clubbing or edema, No new Rash or  bruise    Data  Review:    CBC Recent Labs  Lab 11/15/20 1153 11/16/20 0300 11/17/20 0706 11/18/20 0713 11/19/20 0759  WBC 11.8* 19.5* 10.0 9.8 15.0*  HGB 10.7* 10.5* 8.6* 8.9* 10.7*  HCT 33.8* 33.4* 26.2* 28.5* 33.2*  PLT 288 268 233 254 325  MCV 80.1 80.3 77.3* 80.5 79.4*  MCH 25.4* 25.2* 25.4* 25.1* 25.6*  MCHC 31.7 31.4 32.8 31.2 32.2  RDW 17.8* 17.9* 17.9* 18.1* 18.6*  LYMPHSABS  --   --  2.4 3.1 1.1  MONOABS  --   --  0.9 1.0 0.5  EOSABS  --   --  0.4 0.5 0.3  BASOSABS  --   --  0.0 0.0 0.0    Recent Labs  Lab 11/15/20 1153 11/16/20 0111 11/16/20 0300 11/16/20 1316 11/17/20 0706 11/17/20 0734 11/17/20 1218 11/18/20 0713 11/19/20 0759  NA 124* 124* 128* 127* 128*  --   --  128* 128*  K 6.0* 5.9* 5.9* 5.3* 4.7  --   --  5.1 4.0  CL 98 97* 102 98 100  --   --  100 97*  CO2 13* 12* 14* 18* 20*  --   --  19* 20*  GLUCOSE 126* 131* 51* 92 91  --   --  98 87  BUN 47* 56* 52* 46* 41*  --   --  39* 26*  CREATININE 3.29* 4.26* 3.77* 3.41* 3.27*  --   --  2.62* 1.83*  CALCIUM 8.8* 9.4 9.4 8.5* 8.0*  --   --  8.0* 8.2*  AST 43*  --   --   --   --   --   --  27 35  ALT 24  --   --   --   --   --   --  22 21  ALKPHOS 139*  --   --   --   --   --   --  121 137*  BILITOT 0.9  --   --   --   --   --   --  0.7 0.7  ALBUMIN 2.9*  --   --   --   --   --   --  2.2* 2.2*  MG  --   --   --   --  1.2*  --   --  1.6* 2.0  CRP  --   --   --   --  1.1*  --   --  1.2* 4.7*  DDIMER  --   --   --   --  2.61*  --   --  3.29* 6.12*  LATICACIDVEN  --  5.5*  --   --   --   --   --   --   --   TSH  --   --   --   --   --   --  0.775  --   --   HGBA1C  --   --   --   --  6.0*  --   --   --   --   BNP  --   --   --   --   --  332.9*  --  186.9* 446.6*    ------------------------------------------------------------------------------------------------------------------ No results for input(s): CHOL, HDL, LDLCALC, TRIG, CHOLHDL, LDLDIRECT in the last 72 hours.  Lab Results  Component  Value Date   HGBA1C 6.0 (H) 11/17/2020   ------------------------------------------------------------------------------------------------------------------ Recent Labs    11/17/20 1218  TSH 0.775  Cardiac Enzymes No results for input(s): CKMB, TROPONINI, MYOGLOBIN in the last 168 hours.  Invalid input(s): CK ------------------------------------------------------------------------------------------------------------------    Component Value Date/Time   BNP 446.6 (H) 11/19/2020 0759    Micro Results Recent Results (from the past 240 hour(s))  SARS CORONAVIRUS 2 (TAT 6-24 HRS) Nasopharyngeal     Status: Abnormal   Collection Time: 11/16/20  9:36 AM   Specimen: Nasopharyngeal  Result Value Ref Range Status   SARS Coronavirus 2 POSITIVE (A) NEGATIVE Final    Comment: (NOTE) SARS-CoV-2 target nucleic acids are DETECTED.  The SARS-CoV-2 RNA is generally detectable in upper and lower respiratory specimens during the acute phase of infection. Positive results are indicative of the presence of SARS-CoV-2 RNA. Clinical correlation with patient history and other diagnostic information is  necessary to determine patient infection status. Positive results do not rule out bacterial infection or co-infection with other viruses.  The expected result is Negative.  Fact Sheet for Patients: SugarRoll.be  Fact Sheet for Healthcare Providers: https://www.woods-mathews.com/  This test is not yet approved or cleared by the [place] FDA and  has been authorized for detection and/or diagnosis of SARS-CoV-2 by FDA under an Emergency Use Authorization (EUA). This EUA will remain  in effect (meaning this test can be used) for the duration of the COVID-19 declaration under Section 564(b)(1) of the Act, 21 U. S.C. section 360bbb-3(b)(1), unless the authorization is terminated or revoked sooner.   Performed at [facility], [place] [address] [place], [place] [address]     Radiology Reports CT ABDOMEN PELVIS WO CONTRAST  Result Date: 11/16/2020 CLINICAL DATA:  Abdominal pain nausea EXAM: CT ABDOMEN AND PELVIS WITHOUT CONTRAST TECHNIQUE: Multidetector CT imaging of the abdomen and pelvis was performed following the standard protocol without IV contrast. COMPARISON:  August 23rd 2021 FINDINGS: Lower chest: The visualized heart size within normal limits. No pericardial fluid/thickening. There is a small hiatal hernia present. The visualized portions of the lungs are clear. Hepatobiliary: Although limited due to the lack of intravenous contrast, normal in appearance without gross focal abnormality. No evidence of calcified gallstones or biliary ductal dilatation. Pancreas:  Unremarkable.  No surrounding inflammatory changes. Spleen: Normal in size. Although limited due to the lack of intravenous contrast, normal in appearance. Adrenals/Urinary Tract: Both adrenal glands appear normal. There is a punctate calcifications seen within the upper pole of the left kidney. No right-sided renal or collecting system calculi are seen. Bladder is unremarkable. Stomach/Bowel: A small hiatal hernia is present, however otherwise unremarkable stomach is seen. A right anterior abdominal wall ileostomy is noted. There is been a prior right hemicolectomy wound seen. No dilated loops of bowel are noted. Vascular/Lymphatic: There are no enlarged abdominal or pelvic lymph nodes. Scattered aortic atherosclerotic calcifications are seen without aneurysmal dilatation. Reproductive: The patient is status post hysterectomy. No adnexal masses or collections seen. Other: There are 2 fat containing the upper anterior abdominal wall hernia is present there is also a stable left anterior abdominal wall parastomal hernia containing loops small bowel. Musculoskeletal: No acute or significant osseous findings. Degenerative changes are seen throughout the lumbar spine. IMPRESSION: Right  lower abdominal wall ileostomy with adjacent parastomal hernia. No definite evidence of obstruction. Punctate nonobstructing left renal calculi Aortic Atherosclerosis (ICD10-I70.0). Electronically Signed   By: Prudencio Pair M.D.   On: 11/16/2020 00:59   US RENAL  Result Date: 11/16/2020 CLINICAL DATA:  Acute kidney injury EXAM: RENAL / URINARY TRACT ULTRASOUND COMPLETE COMPARISON:  CT 11/16/2020,  ultrasound 09/12/2016 FINDINGS: Right Kidney: Renal measurements: 9.1 x 4.0 x 4.0 cm = volume: 75 mL. Echogenicity is within normal limits. No concerning renal mass, shadowing calculus or hydronephrosis. Left Kidney: Renal measurements: 9.2 x 5.2 x 3.5 cm = volume: 86 mL. Echogenicity is within normal limits. No concerning renal mass, visible shadowing calculus or hydronephrosis. Few punctate calcifications seen in the upper pole left kidney on CT imaging are not well visualized on this sonographic exam. Bladder: Appears normal for degree of bladder distention. Other: None. IMPRESSION: Unremarkable renal ultrasound. Previously seen punctate upper pole left renal calculi are not well visualized. Electronically Signed   By: Lovena Le M.D.   On: 11/16/2020 03:23   DG CHEST PORT 1 VIEW  Result Date: 11/17/2020 CLINICAL DATA:  COVID-19 positivity EXAM: PORTABLE CHEST 1 VIEW COMPARISON:  10/21/2019 FINDINGS: Cardiac shadow is within normal limits. Aortic calcifications are seen. The lungs are well aerated bilaterally. No focal infiltrate is seen. No bony abnormality is noted. IMPRESSION: No acute abnormality noted. Electronically Signed   By: Inez Catalina M.D.   On: 11/17/2020 01:59   ECHOCARDIOGRAM LIMITED  Result Date: 11/17/2020    ECHOCARDIOGRAM LIMITED REPORT   Patient Name:   DORMA ALTMAN Date of Exam: 11/17/2020 Medical Rec #:  [id number]      Height:       63.0 in Accession #:    [id number]     Weight:       173.1 lb Date of Birth:  Dec 22, 1945      BSA:          1.818 m Patient Age:    63 years       BP:            159/66 mmHg Patient Gender: F              HR:           101 bpm. Exam Location:  Inpatient Procedure: Cardiac Doppler, Color Doppler, Limited Echo and Intracardiac            Opacification Agent Indications:    CHF-Acute Diastolic  History:        Patient has prior history of Echocardiogram examinations. Risk                 Factors:Former Smoker. CKD.  Sonographer:    Clayton Lefort RDCS (AE) Referring Phys: [id number] Margaree Mackintosh Mansura  1. Left ventricular ejection fraction, by estimation, is 45 to 50%. The left ventricle has mildly decreased function. The left ventricle demonstrates regional wall motion abnormalities (see scoring diagram/findings for description). There is moderate left ventricular hypertrophy.  2. The mitral valve was not well visualized. No evidence of mitral valve regurgitation.  3. The aortic valve is tricuspid. Mild aortic valve sclerosis is present, with no evidence of aortic valve stenosis. Comparison(s): Prior images reviewed side by side. Changes from prior study are noted. FINDINGS  Left Ventricle: Left ventricular ejection fraction, by estimation, is 45 to 50%. The left ventricle has mildly decreased function. The left ventricle demonstrates regional wall motion abnormalities. Definity contrast agent was given IV to delineate the left ventricular endocardial borders. There is moderate left ventricular hypertrophy.  LV Wall Scoring: The entire apex is akinetic. Mitral Valve: The mitral valve was not well visualized. Tricuspid Valve: The tricuspid valve is not well visualized. Tricuspid valve regurgitation is not demonstrated. Aortic Valve: The aortic valve is tricuspid. Mild aortic valve sclerosis is present, with no evidence of  aortic valve stenosis. Aortic valve mean gradient measures 2.0 mmHg. Aortic valve peak gradient measures 4.8 mmHg. Aortic valve area, by VTI measures  2.01 cm. LEFT VENTRICLE PLAX 2D LVIDd:         4.10 cm  Diastology LVIDs:         2.50 cm  LV e' medial:     3.05 cm/s LV PW:         1.50 cm  LV E/e' medial:  17.1 LV IVS:        1.60 cm  LV e' lateral:   6.64 cm/s LVOT diam:     2.00 cm  LV E/e' lateral: 7.8 LV SV:         39 LV SV Index:   21 LVOT Area:     3.14 cm  IVC IVC diam: 1.50 cm LEFT ATRIUM         Index LA diam:    2.90 cm 1.59 cm/m  AORTIC VALVE AV Area (Vmax):    2.08 cm AV Area (Vmean):   2.22 cm AV Area (VTI):     2.01 cm AV Vmax:           109.00 cm/s AV Vmean:          62.200 cm/s AV VTI:            0.192 m AV Peak Grad:      4.8 mmHg AV Mean Grad:      2.0 mmHg LVOT Vmax:         72.10 cm/s LVOT Vmean:        44.000 cm/s LVOT VTI:          0.123 m LVOT/AV VTI ratio: 0.64  AORTA Ao Root diam: 2.90 cm Ao Asc diam:  3.10 cm MITRAL VALVE MV Area (PHT): 2.83 cm     SHUNTS MV Decel Time: 268 msec     Systemic VTI:  0.12 m MV E velocity: 52.10 cm/s   Systemic Diam: 2.00 cm MV A velocity: 114.00 cm/s MV E/A ratio:  0.46 Candee Furbish MD Electronically signed by Candee Furbish MD Signature Date/Time: 11/17/2020/5:06:53 PM    Final

## 2020-11-19 NOTE — Progress Notes (Signed)
Physical Therapy Treatment Patient Details Name: [name] MRN: [id number] DOB: [birth year] Today's Date: 11/19/2020    History of Present Illness 75yo female presenting with 10 day hx of nausea and vomiting. Found to be covid positive. Admitted with metabolic acidosis, NAG, and failure of bicarbonate regeneration. PMH CAD, clotting disorder, DM, chronic dizziness, HLD, DOE, HTN, seizures, CVA, femoral artery stent, patella realignment, colostomy    PT Comments    Patient alert and oriented. Showed some poor judgement in admitting she does her own grocery shopping even though it is very tiring for her and had difficulty problem-solving how she could do it differently. (Offered ask her son to add her things to his list when he shops, [employer]). Good ability to maneuver RW in tight spaces, however cues needed to fully finish turning and backing up to toilet prior to letting go of RW. Continues to make progress towards discharge plan of home. Discussed with pt trying to have her grandaughter stay with her for a few days as she first returns home.    Follow Up Recommendations  Home health PT     Equipment Recommendations  Rolling walker with 5" wheels;Other (comment) (shower bench)    Recommendations for Other Services       Precautions / Restrictions Precautions Precautions: Fall;Other (comment) Precaution Comments: colostomy, covid+    Mobility  Bed Mobility Overal bed mobility: Modified Independent             General bed mobility comments: HOB up  Transfers Overall transfer level: Needs assistance Equipment used: Rolling walker (2 wheeled) Transfers: Sit to/from Stand Sit to Stand: Min guard         General transfer comment: min guard for safety  Ambulation/Gait Ambulation/Gait assistance: Min guard Gait Distance (Feet): 12 Feet (x2; to/from BR) Assistive device: Rolling walker (2 wheeled) Gait Pattern/deviations: Step-through pattern;Decreased step length -  right;Decreased step length - left;Drifts right/left;Trunk flexed Gait velocity: decreased   General Gait Details: slow, does become tremulous as fatigue increases   Stairs             Wheelchair Mobility    Modified Rankin (Stroke Patients Only)       Balance Overall balance assessment: Needs assistance;History of Falls Sitting-balance support: Feet supported;No upper extremity supported Sitting balance-Leahy Scale: Good     Standing balance support: Single extremity supported Standing balance-Leahy Scale: Fair Standing balance comment: during pericare                            Cognition Arousal/Alertness: Awake/alert Behavior During Therapy: WFL for tasks assessed/performed Overall Cognitive Status: Impaired/Different from baseline Area of Impairment: Memory;Problem solving;Safety/judgement                     Memory: Decreased short-term memory   Safety/Judgement: Decreased awareness of safety;Decreased awareness of deficits Awareness: Intellectual Problem Solving: Requires tactile cues General Comments: pt aware activities are difficult for her, but does not ask for assistance appropriately from her family      Exercises      General Comments        Pertinent Vitals/Pain Pain Assessment: No/denies pain    Home Living Family/patient expects to be discharged to:: Private residence Living Arrangements: Alone Available Help at Discharge: Family;Available PRN/intermittently (son and daughter in law live nearby) Type of Home: House Home Access: Stairs to enter Entrance Stairs-Rails: Right;Left Home Layout: One level Home Equipment: Environmental consultant - 2 wheels;Grab bars -  tub/shower;Grab bars - toilet;Shower seat Additional Comments: sponge bathes, goes to beauty shop for hair    Prior Function Level of Independence: Independent      Comments: recent hx of falls, was not using AD to ambulate, grocery shopping, but stated it would wear her  out   PT Goals (current goals can now be found in the care plan section) Acute Rehab PT Goals Patient Stated Goal: go home Time For Goal Achievement: 12/01/20 Potential to Achieve Goals: Good Progress towards PT goals: Progressing toward goals    Frequency    Min 3X/week      PT Plan Current plan remains appropriate    Co-evaluation              AM-PAC PT "6 Clicks" Mobility   Outcome Measure  Help needed turning from your back to your side while in a flat bed without using bedrails?: None Help needed moving from lying on your back to sitting on the side of a flat bed without using bedrails?: None Help needed moving to and from a bed to a chair (including a wheelchair)?: A Little Help needed standing up from a chair using your arms (e.g., wheelchair or bedside chair)?: A Little Help needed to walk in hospital room?: A Little Help needed climbing 3-5 steps with a railing? : A Little 6 Click Score: 20    End of Session   Activity Tolerance: Patient tolerated treatment well Patient left: in bed;with call bell/phone within reach (bed in chair position) Nurse Communication: Mobility status;Other (comment) (colostomy needs to be emptied) PT Visit Diagnosis: Unsteadiness on feet (R26.81);Muscle weakness (generalized) (M62.81);History of falling (Z91.81)     Time: 3790-2409 PT Time Calculation (min) (ACUTE ONLY): 15 min  Charges:  $Therapeutic Activity: 8-22 mins                      Arby Barrette, PT Pager [phone]    Rexanne Mano 11/19/2020, 4:51 PM

## 2020-11-20 ENCOUNTER — Inpatient Hospital Stay (HOSPITAL_COMMUNITY): Payer: 59

## 2020-11-20 LAB — CBC WITH DIFFERENTIAL/PLATELET
Abs Immature Granulocytes: 0.08 10*3/uL — ABNORMAL HIGH (ref 0.00–0.07)
Basophils Absolute: 0 10*3/uL (ref 0.0–0.1)
Basophils Relative: 0 %
Eosinophils Absolute: 0.2 10*3/uL (ref 0.0–0.5)
Eosinophils Relative: 1 %
HCT: 26.6 % — ABNORMAL LOW (ref 36.0–46.0)
Hemoglobin: 8.7 g/dL — ABNORMAL LOW (ref 12.0–15.0)
Immature Granulocytes: 1 %
Lymphocytes Relative: 12 %
Lymphs Abs: 1.6 10*3/uL (ref 0.7–4.0)
MCH: 25.8 pg — ABNORMAL LOW (ref 26.0–34.0)
MCHC: 32.7 g/dL (ref 30.0–36.0)
MCV: 78.9 fL — ABNORMAL LOW (ref 80.0–100.0)
Monocytes Absolute: 0.7 10*3/uL (ref 0.1–1.0)
Monocytes Relative: 6 %
Neutro Abs: 10.7 10*3/uL — ABNORMAL HIGH (ref 1.7–7.7)
Neutrophils Relative %: 80 %
Platelets: 282 10*3/uL (ref 150–400)
RBC: 3.37 MIL/uL — ABNORMAL LOW (ref 3.87–5.11)
RDW: 18.9 % — ABNORMAL HIGH (ref 11.5–15.5)
WBC: 13.2 10*3/uL — ABNORMAL HIGH (ref 4.0–10.5)
nRBC: 0 % (ref 0.0–0.2)

## 2020-11-20 LAB — COMPREHENSIVE METABOLIC PANEL
ALT: 16 U/L (ref 0–44)
AST: 24 U/L (ref 15–41)
Albumin: 1.7 g/dL — ABNORMAL LOW (ref 3.5–5.0)
Alkaline Phosphatase: 117 U/L (ref 38–126)
Anion gap: 7 (ref 5–15)
BUN: 21 mg/dL (ref 8–23)
CO2: 22 mmol/L (ref 22–32)
Calcium: 7.7 mg/dL — ABNORMAL LOW (ref 8.9–10.3)
Chloride: 97 mmol/L — ABNORMAL LOW (ref 98–111)
Creatinine, Ser: 2.15 mg/dL — ABNORMAL HIGH (ref 0.44–1.00)
GFR, Estimated: 24 mL/min — ABNORMAL LOW (ref >60)
Glucose, Bld: 102 mg/dL — ABNORMAL HIGH (ref 70–99)
Potassium: 3.8 mmol/L (ref 3.5–5.1)
Sodium: 126 mmol/L — ABNORMAL LOW (ref 135–145)
Total Bilirubin: 0.4 mg/dL (ref 0.3–1.2)
Total Protein: 4.2 g/dL — ABNORMAL LOW (ref 6.5–8.1)

## 2020-11-20 LAB — GLUCOSE, CAPILLARY
Glucose-Capillary: 108 mg/dL — ABNORMAL HIGH (ref 70–99)
Glucose-Capillary: 82 mg/dL (ref 70–99)
Glucose-Capillary: 84 mg/dL (ref 70–99)
Glucose-Capillary: 129 mg/dL — ABNORMAL HIGH (ref 70–99)

## 2020-11-20 LAB — MAGNESIUM: Magnesium: 2 mg/dL (ref 1.7–2.4)

## 2020-11-20 LAB — D-DIMER, QUANTITATIVE: D-Dimer, Quant: 8.12 ug/mL-FEU — ABNORMAL HIGH (ref 0.00–0.50)

## 2020-11-20 LAB — C-REACTIVE PROTEIN: CRP: 10.1 mg/dL — ABNORMAL HIGH (ref <1.0)

## 2020-11-20 LAB — BRAIN NATRIURETIC PEPTIDE: B Natriuretic Peptide: 157.8 pg/mL — ABNORMAL HIGH (ref 0.0–100.0)

## 2020-11-20 LAB — LIPASE, BLOOD: Lipase: 267 U/L — ABNORMAL HIGH (ref 11–51)

## 2020-11-20 MED ORDER — ENOXAPARIN SODIUM 60 MG/0.6ML ~~LOC~~ SOLN
60.0000 mg | Freq: Every day | SUBCUTANEOUS | Status: DC
  Administered 2020-11-21 – 2020-11-23 (×3): 60 mg via SUBCUTANEOUS
  Filled 2020-11-20 (×3): qty 0.6

## 2020-11-20 MED ORDER — LACTATED RINGERS IV SOLN: INTRAVENOUS | Status: DC

## 2020-11-20 MED ORDER — CYCLOBENZAPRINE HCL 5 MG PO TABS: 5.0000 mg | ORAL_TABLET | Freq: Three times a day (TID) | ORAL | Status: DC | PRN

## 2020-11-20 NOTE — Progress Notes (Signed)
CrCl<30 ml/min. Ok to modify Lovenox dose to 60mg  (moderate) qday per Dr. Candiss Norse.  Onnie Boer, PharmD, BCIDP, AAHIVP, CPP Infectious Disease Pharmacist 11/20/2020 10:58 AM

## 2020-11-20 NOTE — TOC Progression Note (Signed)
Transition of Care [facility]) - Progression Note    Patient Details  Name: [name] MRN: [id number] Date of Birth: [birth year]  Transition of Care [facility]) CM/SW Contact  Bartholomew Crews, RN Phone Number: [phone] 11/20/2020, 12:51 PM  Clinical Narrative:     Noted HH orders placed. [facility] can continue to accept referral for PT, OT, Aide, SW - nursing not available at this time. Spoke with patient on her mobile phone. Advised of accepting home health agency. Patient stated that she thinks she is being discharge in the AM. She stated that her daughter-n-law should be able to provide transportation home. Discussed orders for DME - patient stated that she already has a walker and does not need 3N1. TOC following for transition needs.   Expected Discharge Plan: [place] Barriers to Discharge: Continued Medical Work up  Expected Discharge Plan and Services Expected Discharge Plan: [place] In-house Referral: NA Discharge Planning Services: CM Consult Post Acute Care Choice: [place] arrangements for the past 2 months: Single Family Home                 DME Arranged: N/A DME Agency: NA       HH Arranged: PT,OT,Social Work,Nurse's Aide [place] Agency: [facility] Date [place]: 11/20/20 Time [place]: 1855 Representative spoke with at [place]: Amy   Social Determinants of Health ([place]) Interventions    Readmission Risk Interventions No flowsheet data found.

## 2020-11-20 NOTE — Plan of Care (Signed)
  Problem: Education: Goal: Knowledge of General Education information will improve Description: Including pain rating scale, medication(s)/side effects and non-pharmacologic comfort measures Outcome: Completed/Met   Problem: Clinical Measurements: Goal: Ability to maintain clinical measurements within normal limits will improve Outcome: Completed/Met Goal: Will remain free from infection Outcome: Completed/Met Goal: Diagnostic test results will improve Outcome: Completed/Met Goal: Cardiovascular complication will be avoided Outcome: Completed/Met   Problem: Activity: Goal: Risk for activity intolerance will decrease Outcome: Completed/Met   Problem: Nutrition: Goal: Adequate nutrition will be maintained Outcome: Completed/Met   Problem: Coping: Goal: Level of anxiety will decrease Outcome: Completed/Met   Problem: Elimination: Goal: Will not experience complications related to bowel motility Outcome: Completed/Met Goal: Will not experience complications related to urinary retention Outcome: Completed/Met

## 2020-11-20 NOTE — Progress Notes (Signed)
PROGRESS NOTE                                                                                                                                                                                                             Patient Demographics:    [name], is a 75 y.o. female, DOB - Mar 30, 1946, WIO:[id number]  Outpatient Primary MD for the patient is Joanna Battles, MD    LOS - 4  Admit date - 11/15/2020    Chief Complaint  Patient presents with  . Vomiting       Brief Narrative (HPI from H&P) [name] is a 75 y.o. 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  She was found to have AKI hyperkalemia, dehydration and admitted to the hospital.    Subjective:   Patient in bed, appears comfortable, denies any headache, no fever, no chest pain or pressure, no shortness of breath , no abdominal pain. No focal weakness. ++ ileostomy output.   Assessment  & Plan :     1. Abd Pain, acute on chronic diarrhea, has history of high output ileostomy. Hydrate with IV fluids, on scheduled Creon, Lomotil and as needed Imodium, 2 doses of Tincher of opium.  CT abdomen pelvis nonacute exam benign.  Abdominal pain completely resolved on 11/20/2020 but lipase is elevated, this could be nonspecific of chronic pancreatitis, she is on pancreatic enzymes on a chronic basis but denies any knowledge of chronic pancreatitis, will monitor clinically  Lipase     Component Value Date/Time   LIPASE 267 (H) 11/20/2020 0433     2. Diarrhea induced severe dehydration, hyponatremia, AKI, hyperkalemia, non-anion gap metabolic acidosis.  Management of  diarrhea as above, AKI, hyperkalemia have resolved and acidosis is improving, underlying CKD 3B with baseline creatinine around 1.5.  Continue gentle IV fluid for hyponatremia.  3. Acute Covid 19 infection  - CXR stable, no SOB, monitor, fully Vaccinated- boosted.  Encouraged the patient to sit up in chair in the daytime use I-S and flutter valve for pulmonary toiletry and then prone in bed  when at night.  Will advance activity and titrate down oxygen as possible.   Recent Labs  Lab 11/15/20 1153 11/16/20 0111 11/16/20 0300 11/16/20 0936 11/17/20 0706 11/17/20 0734 11/18/20 0713 11/19/20 0759 11/20/20 0433  WBC 11.8*  --  19.5*  --  10.0  --  9.8 15.0* 13.2*  HGB 10.7*  --  10.5*  --  8.6*  --  8.9* 10.7* 8.7*  HCT 33.8*  --  33.4*  --  26.2*  --  28.5* 33.2* 26.6*  PLT 288  --  268  --  233  --  254 325 282  CRP  --   --   --   --  1.1*  --  1.2* 4.7* 10.1*  BNP  --   --   --   --   --  332.9* 186.9* 446.6* 157.8*  DDIMER  --   --   --   --  2.61*  --  3.29* 6.12* 8.12*  AST 43*  --   --   --   --   --  27 35 24  ALT 24  --   --   --   --   --  22 21 16   ALKPHOS 139*  --   --   --   --   --  121 137* 117  BILITOT 0.9  --   --   --   --   --  0.7 0.7 0.4  ALBUMIN 2.9*  --   --   --   --   --  2.2* 2.2* 1.7*  LATICACIDVEN  --  5.5*  --   --   --   --   --   --   --   SARSCOV2NAA  --   --   --  POSITIVE*  --   --   --   --   --      4.  History of bowel ischemia requiring small bowel resection and how output ileostomy.  Supportive care, ostomy nurses following.  5.  History of seizures.  Due to ongoing diarrhea and questionable absorption for now IV Keppra instead of oral.  6.  Sinus tachycardia and hypertension.  On beta-blocker Norvasc monitor.  Hydrate.  7.  History of CAD.  On beta-blocker, dual antiplatelet therapy and Lipitor, no acute issues.  8.  Hypomagnesemia.  Replaced IV 4 gm & stable.  9. HTN - on Lopressor, DC Norvasc & monitor.  10.  Rising D-dimer, high  risk for developing DVT, moderate dose Lovenox, negative leg ultrasound.  11.  Increased deconditioning and somnolence at home.  Per daughter this all started since she started taking Abilify, Abilify will be stopped and Flexeril dose will be reduced.  Per daughter there is a unlisted pain medicine that was recently prescribed by PCP office and this needs to be discontinued permanently, will request PCP not to prescribe any narcotics unless delay needed.  HHPT and DME  Ordered, possible DC 11/21/20.       Condition - Fair  Family Communication  :  Daughter [name] [phone], 11/17/20, 11/18/20, 11/19/20, 11/20/20  Code Status :  Full  Consults  :  None   Procedures  :      TTE - 1. Left ventricular ejection fraction, by estimation, is 45 to 50%. The left ventricle has mildly decreased function. The left ventricle demonstrates regional wall motion abnormalities (see scoring diagram/findings for description). There is moderate left ventricular hypertrophy.  2.  The mitral valve was not well visualized. No evidence of mitral valve regurgitation.  3. The aortic valve is tricuspid. Mild aortic valve sclerosis is present, with no evidence of aortic valve stenosis. Comparison(s): Prior images reviewed side by side. Changes from prior study are noted.  Renal US - Non acute.   CT - Right lower abdominal wall ileostomy with adjacent parastomal hernia. No definite evidence of obstruction. Punctate nonobstructing left renal calculi Aortic Atherosclerosis (ICD10-I70.0).      PUD Prophylaxis : PPI  Disposition Plan  :    Status is: Inpatient  Remains inpatient appropriate because:IV treatments appropriate due to intensity of illness or inability to take PO   Dispo: The patient is from: Home              Anticipated d/c is to: Home              Anticipated d/c date is: 3 days              Patient currently is not medically stable to d/c.   Difficult to place patient No   DVT Prophylaxis  :   Heparin    Lab Results  Component Value Date   PLT 282 11/20/2020    Diet :  Diet Order            DIET SOFT Room service appropriate? Yes; Fluid consistency: Thin  Diet effective now                  Inpatient Medications  Scheduled Meds: . ARIPiprazole  5 mg Oral Daily  . atorvastatin  40 mg Oral Daily  . clopidogrel  75 mg Oral Daily  . diphenoxylate-atropine  1 tablet Oral TID  . enoxaparin (LOVENOX) injection  60 mg Subcutaneous Q12H  . feeding supplement  237 mL Oral BID BM  . hydrALAZINE  50 mg Oral Q8H  . insulin aspart  0-9 Units Subcutaneous TID WC  . lipase/protease/amylase  36,000 Units Oral Daily  . metoprolol tartrate  100 mg Oral BID  . multivitamin with minerals  1 tablet Oral Daily  . pantoprazole  40 mg Oral BID  . sodium bicarbonate  650 mg Oral TID  . sodium chloride  1 g Oral TID WC   Continuous Infusions: . lactated ringers    . levETIRAcetam 500 mg (11/20/20 0032)   PRN Meds:.acetaminophen **OR** [DISCONTINUED] acetaminophen, cyclobenzaprine, hydrALAZINE, loperamide, melatonin, metoprolol tartrate, [DISCONTINUED] ondansetron **OR** ondansetron (ZOFRAN) IV, traMADol  Antibiotics  :    Anti-infectives (From admission, onward)   None       Time Spent in minutes  30   Lala Lund M.D on 11/20/2020 at 10:35 AM  To page go to [email or web address]   [facility] -  Office  [phone]    See all Orders from today for further details    Objective:   Vitals:   11/19/20 0832 11/19/20 1500 11/19/20 2042 11/20/20 0415  BP: 114/76 (!) 142/76 (!) 123/50 (!) 111/42  Pulse: 65 62 70 70  Resp: 18 18 20 18   Temp: 97.8 F (36.6 C) 98 F (36.7 C) 97.9 F (36.6 C) 97.8 F (36.6 C)  TempSrc:  Oral Oral Oral  SpO2: 94%  96% 97%  Weight:      Height:        Wt Readings from Last 3 Encounters:  11/17/20 78.4 kg  08/08/20 80.4 kg  06/27/20 78.5 kg     Intake/Output Summary (Last 24 hours) at 11/20/2020 1035 Last  data filed at 11/20/2020  0819 Gross per 24 hour  Intake 2585.94 ml  Output 2000 ml  Net 585.94 ml     Physical Exam  Awake Alert, No new F.N deficits, Normal affect [place].AT,PERRAL Supple Neck,No JVD, No cervical lymphadenopathy appriciated.  Symmetrical Chest wall movement, Good air movement bilaterally, CTAB RRR,No Gallops, Rubs or new Murmurs, No Parasternal Heave +ve B.Sounds, Abd Soft, No tenderness, R. Sided ileostomy bag in place. No Cyanosis, Clubbing or edema, No new Rash or bruise    Data Review:    CBC Recent Labs  Lab 11/16/20 0300 11/17/20 0706 11/18/20 0713 11/19/20 0759 11/20/20 0433  WBC 19.5* 10.0 9.8 15.0* 13.2*  HGB 10.5* 8.6* 8.9* 10.7* 8.7*  HCT 33.4* 26.2* 28.5* 33.2* 26.6*  PLT 268 233 254 325 282  MCV 80.3 77.3* 80.5 79.4* 78.9*  MCH 25.2* 25.4* 25.1* 25.6* 25.8*  MCHC 31.4 32.8 31.2 32.2 32.7  RDW 17.9* 17.9* 18.1* 18.6* 18.9*  LYMPHSABS  --  2.4 3.1 1.1 1.6  MONOABS  --  0.9 1.0 0.5 0.7  EOSABS  --  0.4 0.5 0.3 0.2  BASOSABS  --  0.0 0.0 0.0 0.0    Recent Labs  Lab 11/15/20 1153 11/16/20 0111 11/16/20 0300 11/16/20 1316 11/17/20 0706 11/17/20 0734 11/17/20 1218 11/18/20 0713 11/19/20 0759 11/20/20 0433  NA 124* 124*   < > 127* 128*  --   --  128* 128* 126*  K 6.0* 5.9*   < > 5.3* 4.7  --   --  5.1 4.0 3.8  CL 98 97*   < > 98 100  --   --  100 97* 97*  CO2 13* 12*   < > 18* 20*  --   --  19* 20* 22  GLUCOSE 126* 131*   < > 92 91  --   --  98 87 102*  BUN 47* 56*   < > 46* 41*  --   --  39* 26* 21  CREATININE 3.29* 4.26*   < > 3.41* 3.27*  --   --  2.62* 1.83* 2.15*  CALCIUM 8.8* 9.4   < > 8.5* 8.0*  --   --  8.0* 8.2* 7.7*  AST 43*  --   --   --   --   --   --  27 35 24  ALT 24  --   --   --   --   --   --  22 21 16   ALKPHOS 139*  --   --   --   --   --   --  121 137* 117  BILITOT 0.9  --   --   --   --   --   --  0.7 0.7 0.4  ALBUMIN 2.9*  --   --   --   --   --   --  2.2* 2.2* 1.7*  MG  --   --   --   --  1.2*  --   --  1.6* 2.0 2.0  CRP  --   --    --   --  1.1*  --   --  1.2* 4.7* 10.1*  DDIMER  --   --   --   --  2.61*  --   --  3.29* 6.12* 8.12*  LATICACIDVEN  --  5.5*  --   --   --   --   --   --   --   --  TSH  --   --   --   --   --   --  0.775  --   --   --   HGBA1C  --   --   --   --  6.0*  --   --   --   --   --   BNP  --   --   --   --   --  332.9*  --  186.9* 446.6* 157.8*   < > = values in this interval not displayed.    ------------------------------------------------------------------------------------------------------------------ No results for input(s): CHOL, HDL, LDLCALC, TRIG, CHOLHDL, LDLDIRECT in the last 72 hours.  Lab Results  Component Value Date   HGBA1C 6.0 (H) 11/17/2020   ------------------------------------------------------------------------------------------------------------------ Recent Labs    11/17/20 1218  TSH 0.775    Cardiac Enzymes No results for input(s): CKMB, TROPONINI, MYOGLOBIN in the last 168 hours.  Invalid input(s): CK ------------------------------------------------------------------------------------------------------------------    Component Value Date/Time   BNP 157.8 (H) 11/20/2020 4854    Micro Results Recent Results (from the past 240 hour(s))  SARS CORONAVIRUS 2 (TAT 6-24 HRS) Nasopharyngeal     Status: Abnormal   Collection Time: 11/16/20  9:36 AM   Specimen: Nasopharyngeal  Result Value Ref Range Status   SARS Coronavirus 2 POSITIVE (A) NEGATIVE Final    Comment: (NOTE) SARS-CoV-2 target nucleic acids are DETECTED.  The SARS-CoV-2 RNA is generally detectable in upper and lower respiratory specimens during the acute phase of infection. Positive results are indicative of the presence of SARS-CoV-2 RNA. Clinical correlation with patient history and other diagnostic information is  necessary to determine patient infection status. Positive results do not rule out bacterial infection or co-infection with other viruses.  The expected result is Negative.  Fact  Sheet for Patients: SugarRoll.be  Fact Sheet for Healthcare Providers: https://www.woods-mathews.com/  This test is not yet approved or cleared by the [place] FDA and  has been authorized for detection and/or diagnosis of SARS-CoV-2 by FDA under an Emergency Use Authorization (EUA). This EUA will remain  in effect (meaning this test can be used) for the duration of the COVID-19 declaration under Section 564(b)(1) of the Act, 21 U. S.C. section 360bbb-3(b)(1), unless the authorization is terminated or revoked sooner.   Performed at [facility], [place] [address] [place], [place] [address]     Radiology Reports CT ABDOMEN PELVIS WO CONTRAST  Result Date: 11/16/2020 CLINICAL DATA:  Abdominal pain nausea EXAM: CT ABDOMEN AND PELVIS WITHOUT CONTRAST TECHNIQUE: Multidetector CT imaging of the abdomen and pelvis was performed following the standard protocol without IV contrast. COMPARISON:  August 23rd 2021 FINDINGS: Lower chest: The visualized heart size within normal limits. No pericardial fluid/thickening. There is a small hiatal hernia present. The visualized portions of the lungs are clear. Hepatobiliary: Although limited due to the lack of intravenous contrast, normal in appearance without gross focal abnormality. No evidence of calcified gallstones or biliary ductal dilatation. Pancreas:  Unremarkable.  No surrounding inflammatory changes. Spleen: Normal in size. Although limited due to the lack of intravenous contrast, normal in appearance. Adrenals/Urinary Tract: Both adrenal glands appear normal. There is a punctate calcifications seen within the upper pole of the left kidney. No right-sided renal or collecting system calculi are seen. Bladder is unremarkable. Stomach/Bowel: A small hiatal hernia is present, however otherwise unremarkable stomach is seen. A right anterior abdominal wall ileostomy is noted. There is been a prior right  hemicolectomy wound seen. No dilated loops  of bowel are noted. Vascular/Lymphatic: There are no enlarged abdominal or pelvic lymph nodes. Scattered aortic atherosclerotic calcifications are seen without aneurysmal dilatation. Reproductive: The patient is status post hysterectomy. No adnexal masses or collections seen. Other: There are 2 fat containing the upper anterior abdominal wall hernia is present there is also a stable left anterior abdominal wall parastomal hernia containing loops small bowel. Musculoskeletal: No acute or significant osseous findings. Degenerative changes are seen throughout the lumbar spine. 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 No definite evidence of obstruction. Punctate nonobstructing left renal calculi Aortic Atherosclerosis (ICD10-I70.0). Electronically Signed   By: Prudencio Pair M.D.   On: 11/16/2020 00:59   US RENAL  Result Date: 11/16/2020 CLINICAL DATA:  Acute kidney injury EXAM: RENAL / URINARY TRACT ULTRASOUND COMPLETE COMPARISON:  CT 11/16/2020, ultrasound 09/12/2016 FINDINGS: Right Kidney: Renal measurements: 9.1 x 4.0 x 4.0 cm = volume: 75 mL. Echogenicity is within normal limits. No concerning renal mass, shadowing calculus or hydronephrosis. Left Kidney: Renal measurements: 9.2 x 5.2 x 3.5 cm = volume: 86 mL. Echogenicity is within normal limits. No concerning renal mass, visible shadowing calculus or hydronephrosis. Few punctate calcifications seen in the upper pole left kidney on CT imaging are not well visualized on this sonographic exam. Bladder: Appears normal for degree of bladder distention. Other: None. IMPRESSION: Unremarkable renal ultrasound. Previously seen punctate upper pole left renal calculi are not well visualized. Electronically Signed   By: Lovena Le M.D.   On: 11/16/2020 03:23   DG Chest Port 1 View  Result Date: 11/20/2020 CLINICAL DATA:  Shortness of breath, COVID positive EXAM: PORTABLE CHEST 1 VIEW  COMPARISON:  11/17/2020 FINDINGS: Bilateral lower lobe opacities, likely reflecting a combination of atelectasis/pneumonia with small pleural effusions. No pneumothorax. The heart is in size.  Thoracic aortic atherosclerosis. IMPRESSION: Bilateral lower lobe opacities, likely reflecting a combination of atelectasis/pneumonia with small pleural effusions. Electronically Signed   By: Julian Hy M.D.   On: 11/20/2020 08:44   DG CHEST PORT 1 VIEW  Result Date: 11/17/2020 CLINICAL DATA:  COVID-19 positivity EXAM: PORTABLE CHEST 1 VIEW COMPARISON:  10/21/2019 FINDINGS: Cardiac shadow is within normal limits. Aortic calcifications are seen. The lungs are well aerated bilaterally. No focal infiltrate is seen. No bony abnormality is noted. IMPRESSION: No acute abnormality noted. Electronically Signed   By: Inez Catalina M.D.   On: 11/17/2020 01:59   VAS [place] LOWER EXTREMITY VENOUS (DVT)  Result Date: 11/19/2020  Lower Venous DVT Study Indications: Covid, elevated d-dimer.  Comparison Study: No prior studies. Performing Technologist: Darlin Coco RDMS  Examination Guidelines: A complete evaluation includes B-mode imaging, spectral Doppler, color Doppler, and power Doppler as needed of all accessible portions of each vessel. Bilateral testing is considered an integral part of a complete examination. Limited examinations for reoccurring indications may be performed as noted. The reflux portion of the exam is performed with the patient in reverse Trendelenburg.  +---------+---------------+---------+-----------+----------+--------------+ RIGHT    CompressibilityPhasicitySpontaneityPropertiesThrombus Aging +---------+---------------+---------+-----------+----------+--------------+ CFV      Full           Yes      Yes                                 +---------+---------------+---------+-----------+----------+--------------+ SFJ      Full                                                         +---------+---------------+---------+-----------+----------+--------------+  FV Prox  Full                                                        +---------+---------------+---------+-----------+----------+--------------+ FV Mid   Full                                                        +---------+---------------+---------+-----------+----------+--------------+ FV DistalFull                                                        +---------+---------------+---------+-----------+----------+--------------+ PFV      Full                                                        +---------+---------------+---------+-----------+----------+--------------+ POP      Full           Yes      Yes                                 +---------+---------------+---------+-----------+----------+--------------+ PTV      Full                                                        +---------+---------------+---------+-----------+----------+--------------+ PERO     Full                                                        +---------+---------------+---------+-----------+----------+--------------+   +---------+---------------+---------+-----------+----------+--------------+ LEFT     CompressibilityPhasicitySpontaneityPropertiesThrombus Aging +---------+---------------+---------+-----------+----------+--------------+ CFV      Full           Yes      Yes                                 +---------+---------------+---------+-----------+----------+--------------+ SFJ      Full                                                        +---------+---------------+---------+-----------+----------+--------------+ FV Prox  Full                                                        +---------+---------------+---------+-----------+----------+--------------+  FV Mid   Full                                                         +---------+---------------+---------+-----------+----------+--------------+ FV DistalFull                                                        +---------+---------------+---------+-----------+----------+--------------+ PFV      Full                                                        +---------+---------------+---------+-----------+----------+--------------+ POP      Full           Yes      Yes                                 +---------+---------------+---------+-----------+----------+--------------+ PTV      Full                                                        +---------+---------------+---------+-----------+----------+--------------+ PERO     Full                                                        +---------+---------------+---------+-----------+----------+--------------+     Summary: RIGHT: - There is no evidence of deep vein thrombosis in the lower extremity.  - No cystic structure found in the popliteal fossa.  LEFT: - There is no evidence of deep vein thrombosis in the lower extremity.  - No cystic structure found in the popliteal fossa.  *See table(s) above for measurements and observations. Electronically signed by Monica Martinez MD on 11/19/2020 at 7:54:32 PM.    Final    ECHOCARDIOGRAM LIMITED  Result Date: 11/17/2020    ECHOCARDIOGRAM LIMITED REPORT   Patient Name:   TRACIA LACOMB Date of Exam: 11/17/2020 Medical Rec #:  [id number]      Height:       63.0 in Accession #:    [id number]     Weight:       173.1 lb Date of Birth:  1946-06-07      BSA:          1.818 m Patient Age:    70 years       BP:           159/66 mmHg Patient Gender: F              HR:           101 bpm. Exam Location:  Inpatient Procedure: Cardiac Doppler, Color Doppler, Limited Echo and Intracardiac  Opacification Agent Indications:    CHF-Acute Diastolic  History:        Patient has prior history of Echocardiogram examinations. Risk                 Factors:Former Smoker.  CKD.  Sonographer:    Clayton Lefort RDCS (AE) Referring Phys: [id number] [place]  1. Left ventricular ejection fraction, by estimation, is 45 to 50%. The left ventricle has mildly decreased function. The left ventricle demonstrates regional wall motion abnormalities (see scoring diagram/findings for description). There is moderate left ventricular hypertrophy.  2. The mitral valve was not well visualized. No evidence of mitral valve regurgitation.  3. The aortic valve is tricuspid. Mild aortic valve sclerosis is present, with no evidence of aortic valve stenosis. Comparison(s): Prior images reviewed side by side. Changes from prior study are noted. FINDINGS  Left Ventricle: Left ventricular ejection fraction, by estimation, is 45 to 50%. The left ventricle has mildly decreased function. The left ventricle demonstrates regional wall motion abnormalities. Definity contrast agent was given IV to delineate the left ventricular endocardial borders. There is moderate left ventricular hypertrophy.  LV Wall Scoring: The entire apex is akinetic. Mitral Valve: The mitral valve was not well visualized. Tricuspid Valve: The tricuspid valve is not well visualized. Tricuspid valve regurgitation is not demonstrated. Aortic Valve: The aortic valve is tricuspid. Mild aortic valve sclerosis is present, with no evidence of aortic valve stenosis. Aortic valve mean gradient measures 2.0 mmHg. Aortic valve peak gradient measures 4.8 mmHg. Aortic valve area, by VTI measures  2.01 cm. LEFT VENTRICLE PLAX 2D LVIDd:         4.10 cm  Diastology LVIDs:         2.50 cm  LV e' medial:    3.05 cm/s LV PW:         1.50 cm  LV E/e' medial:  17.1 LV IVS:        1.60 cm  LV e' lateral:   6.64 cm/s LVOT diam:     2.00 cm  LV E/e' lateral: 7.8 LV SV:         39 LV SV Index:   21 LVOT Area:     3.14 cm  IVC IVC diam: 1.50 cm LEFT ATRIUM         Index LA diam:    2.90 cm 1.59 cm/m  AORTIC VALVE AV Area (Vmax):    2.08 cm AV Area (Vmean):    2.22 cm AV Area (VTI):     2.01 cm AV Vmax:           109.00 cm/s AV Vmean:          62.200 cm/s AV VTI:            0.192 m AV Peak Grad:      4.8 mmHg AV Mean Grad:      2.0 mmHg LVOT Vmax:         72.10 cm/s LVOT Vmean:        44.000 cm/s LVOT VTI:          0.123 m LVOT/AV VTI ratio: 0.64  AORTA Ao Root diam: 2.90 cm Ao Asc diam:  3.10 cm MITRAL VALVE MV Area (PHT): 2.83 cm     SHUNTS MV Decel Time: 268 msec     Systemic VTI:  0.12 m MV E velocity: 52.10 cm/s   Systemic Diam: 2.00 cm MV A velocity: 114.00 cm/s MV E/A ratio:  0.46 Mark  Skains MD Electronically signed by Candee Furbish MD Signature Date/Time: 11/17/2020/5:06:53 PM    Final

## 2020-11-21 LAB — CBC WITH DIFFERENTIAL/PLATELET
Abs Immature Granulocytes: 0.06 10*3/uL (ref 0.00–0.07)
Basophils Absolute: 0 10*3/uL (ref 0.0–0.1)
Basophils Relative: 0 %
Eosinophils Absolute: 0.2 10*3/uL (ref 0.0–0.5)
Eosinophils Relative: 2 %
HCT: 24.3 % — ABNORMAL LOW (ref 36.0–46.0)
Hemoglobin: 8.2 g/dL — ABNORMAL LOW (ref 12.0–15.0)
Immature Granulocytes: 1 %
Lymphocytes Relative: 14 %
Lymphs Abs: 1.8 10*3/uL (ref 0.7–4.0)
MCH: 26.7 pg (ref 26.0–34.0)
MCHC: 33.7 g/dL (ref 30.0–36.0)
MCV: 79.2 fL — ABNORMAL LOW (ref 80.0–100.0)
Monocytes Absolute: 1.3 10*3/uL — ABNORMAL HIGH (ref 0.1–1.0)
Monocytes Relative: 10 %
Neutro Abs: 9.3 10*3/uL — ABNORMAL HIGH (ref 1.7–7.7)
Neutrophils Relative %: 73 %
Platelets: 258 10*3/uL (ref 150–400)
RBC: 3.07 MIL/uL — ABNORMAL LOW (ref 3.87–5.11)
RDW: 19 % — ABNORMAL HIGH (ref 11.5–15.5)
WBC: 12.7 10*3/uL — ABNORMAL HIGH (ref 4.0–10.5)
nRBC: 0 % (ref 0.0–0.2)

## 2020-11-21 LAB — PHOSPHORUS: Phosphorus: 2.5 mg/dL (ref 2.5–4.6)

## 2020-11-21 LAB — COMPREHENSIVE METABOLIC PANEL
ALT: 16 U/L (ref 0–44)
AST: 23 U/L (ref 15–41)
Albumin: 1.7 g/dL — ABNORMAL LOW (ref 3.5–5.0)
Alkaline Phosphatase: 131 U/L — ABNORMAL HIGH (ref 38–126)
Anion gap: 9 (ref 5–15)
BUN: 18 mg/dL (ref 8–23)
CO2: 19 mmol/L — ABNORMAL LOW (ref 22–32)
Calcium: 7.8 mg/dL — ABNORMAL LOW (ref 8.9–10.3)
Chloride: 99 mmol/L (ref 98–111)
Creatinine, Ser: 1.92 mg/dL — ABNORMAL HIGH (ref 0.44–1.00)
GFR, Estimated: 27 mL/min — ABNORMAL LOW (ref >60)
Glucose, Bld: 92 mg/dL (ref 70–99)
Potassium: 4.1 mmol/L (ref 3.5–5.1)
Sodium: 127 mmol/L — ABNORMAL LOW (ref 135–145)
Total Bilirubin: 0.8 mg/dL (ref 0.3–1.2)
Total Protein: 4.4 g/dL — ABNORMAL LOW (ref 6.5–8.1)

## 2020-11-21 LAB — MAGNESIUM: Magnesium: 1.6 mg/dL — ABNORMAL LOW (ref 1.7–2.4)

## 2020-11-21 LAB — BRAIN NATRIURETIC PEPTIDE: B Natriuretic Peptide: 173.1 pg/mL — ABNORMAL HIGH (ref 0.0–100.0)

## 2020-11-21 LAB — GLUCOSE, CAPILLARY
Glucose-Capillary: 104 mg/dL — ABNORMAL HIGH (ref 70–99)
Glucose-Capillary: 95 mg/dL (ref 70–99)
Glucose-Capillary: 102 mg/dL — ABNORMAL HIGH (ref 70–99)
Glucose-Capillary: 174 mg/dL — ABNORMAL HIGH (ref 70–99)

## 2020-11-21 LAB — D-DIMER, QUANTITATIVE: D-Dimer, Quant: 6.97 ug/mL-FEU — ABNORMAL HIGH (ref 0.00–0.50)

## 2020-11-21 LAB — C-REACTIVE PROTEIN: CRP: 8.5 mg/dL — ABNORMAL HIGH (ref <1.0)

## 2020-11-21 LAB — LIPASE, BLOOD: Lipase: 268 U/L — ABNORMAL HIGH (ref 11–51)

## 2020-11-21 MED ORDER — SODIUM CHLORIDE 0.9 % IV SOLN: INTRAVENOUS | Status: AC

## 2020-11-21 MED ORDER — MAGNESIUM SULFATE 4 GM/100ML IV SOLN
4.0000 g | Freq: Once | INTRAVENOUS | Status: AC
  Administered 2020-11-21: 4 g via INTRAVENOUS
  Filled 2020-11-21: qty 100

## 2020-11-21 NOTE — Plan of Care (Signed)
  Problem: Health Behavior/Discharge Planning: Goal: Ability to manage health-related needs will improve Outcome: Progressing   Problem: Clinical Measurements: Goal: Respiratory complications will improve Outcome: Progressing   Problem: Pain Managment: Goal: General experience of comfort will improve Outcome: Progressing   

## 2020-11-21 NOTE — Progress Notes (Signed)
PROGRESS NOTE                                                                                                                                                                                                             Patient Demographics:    [name], is a 75 y.o. female, DOB - 09/13/1946, DPO:[id number]  Outpatient Primary MD for the patient is Leanna Battles, MD    LOS - 5  Admit date - 11/15/2020    Chief Complaint  Patient presents with  . Vomiting       Brief Narrative (HPI from H&P) [name] is a 75 y.o. 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  She was found to have AKI hyperkalemia, dehydration and admitted to the hospital.  With supportive care she is much better, once sodium and magnesium levels improved she can be discharged home with home health PT, appropriate orders have been placed including for DME.  New note per daughter Abilify has to be discontinued, Flexeril dose has to be reduced upon discharge.    Subjective:   Patient in bed, appears comfortable, denies any headache, no fever, no chest pain or pressure, no shortness of breath , no abdominal pain. No focal weakness. ++ ileostomy output.   Assessment  & Plan :     1. Abd Pain, acute on chronic diarrhea, has history of high output ileostomy. Hydrate with IV fluids, on scheduled Creon, Lomotil and as needed Imodium, 2 doses of Tincher of opium.  CT abdomen pelvis nonacute exam benign.  Abdominal pain completely resolved on 11/20/2020 but lipase is elevated, this could be nonspecific of chronic pancreatitis, she is on pancreatic enzymes on a  chronic basis but denies any knowledge of chronic pancreatitis, will monitor clinically  Lipase     Component Value Date/Time   LIPASE 268 (H) 11/21/2020 0726     2. Diarrhea induced severe dehydration, hyponatremia, AKI, hyperkalemia, non-anion gap metabolic acidosis.  Management of diarrhea as above, AKI, hyperkalemia have resolved and acidosis is improving, underlying CKD 3B with baseline creatinine around  1.5.  Continue IV fluid for hyponatremia rate increased.  3. Acute Covid 19 infection  - CXR stable, no SOB, monitor, fully Vaccinated- boosted.  Encouraged the patient to sit up in chair in the daytime use I-S and flutter valve for pulmonary toiletry and then prone in bed when at night.  Will advance activity and titrate down oxygen as possible.   Recent Labs  Lab 11/15/20 1153 11/16/20 0111 11/16/20 0300 11/16/20 0936 11/17/20 0706 11/17/20 0734 11/18/20 0713 11/19/20 0759 11/20/20 0433 11/21/20 0726  WBC 11.8*  --    < >  --  10.0  --  9.8 15.0* 13.2* 12.7*  HGB 10.7*  --    < >  --  8.6*  --  8.9* 10.7* 8.7* 8.2*  HCT 33.8*  --    < >  --  26.2*  --  28.5* 33.2* 26.6* 24.3*  PLT 288  --    < >  --  233  --  254 325 282 258  CRP  --   --   --   --  1.1*  --  1.2* 4.7* 10.1* 8.5*  BNP  --   --   --   --   --  332.9* 186.9* 446.6* 157.8* 173.1*  DDIMER  --   --   --   --  2.61*  --  3.29* 6.12* 8.12* 6.97*  AST 43*  --   --   --   --   --  27 35 24 23  ALT 24  --   --   --   --   --  22 21 16 16   ALKPHOS 139*  --   --   --   --   --  121 137* 117 131*  BILITOT 0.9  --   --   --   --   --  0.7 0.7 0.4 0.8  ALBUMIN 2.9*  --   --   --   --   --  2.2* 2.2* 1.7* 1.7*  LATICACIDVEN  --  5.5*  --   --   --   --   --   --   --   --   SARSCOV2NAA  --   --   --  POSITIVE*  --   --   --   --   --   --    < > = values in this interval not displayed.     4.  History of bowel ischemia requiring small bowel resection and how output ileostomy.  Supportive care, ostomy nurses  following.  5.  History of seizures.  Due to ongoing diarrhea and questionable absorption for now IV Keppra instead of oral.  6.  Sinus tachycardia and hypertension.  On beta-blocker Norvasc monitor.  Hydrate.  7.  History of CAD.  On beta-blocker, dual antiplatelet therapy and Lipitor, no acute issues.  8.  Hypomagnesemia.  Replaced IV 4 gm & monitor.  9. HTN - on Lopressor, DC Norvasc & monitor.  10.  Nonspecific elevation of lipase.  No abdominal pain, CT scan does not show any acute or chronic changes, interestingly she is on chronic Creon supplement but does not know of any history of pancreatic issues, since CT scan is stable and there is no pain will not check it further.   11. Rising D-dimer, high risk for developing DVT, moderate dose Lovenox, negative leg ultrasound.  12.  Increased deconditioning and somnolence at home.  Per daughter this  all started since she started taking Abilify, Abilify will be stopped and Flexeril dose will be reduced (note upon DC).  Per daughter there is a unlisted pain medicine that was recently prescribed by PCP office and this needs to be discontinued permanently, will request PCP not to prescribe any narcotics unless delay needed.  HHPT and DME  Ordered.       Condition - Fair  Family Communication  :  Daughter [name] [phone], 11/17/20, 11/18/20, 11/19/20, 11/20/20  Code Status :  Full  Consults  :  None   Procedures  :      TTE - 1. Left ventricular ejection fraction, by estimation, is 45 to 50%. The left ventricle has mildly decreased function. The left ventricle demonstrates regional wall motion abnormalities (see scoring diagram/findings for description). There is moderate left ventricular hypertrophy.  2. The mitral valve was not well visualized. No evidence of mitral valve regurgitation.  3. The aortic valve is tricuspid. Mild aortic valve sclerosis is present, with no evidence of aortic valve stenosis. Comparison(s): Prior images reviewed  side by side. Changes from prior study are noted.  Renal US - Non acute.  CT - Right lower abdominal wall ileostomy with adjacent parastomal hernia. No definite evidence of obstruction. Punctate nonobstructing left renal calculi Aortic Atherosclerosis (ICD10-I70.0).      PUD Prophylaxis : PPI  Disposition Plan  :    Status is: Inpatient  Remains inpatient appropriate because:IV treatments appropriate due to intensity of illness or inability to take PO   Dispo: The patient is from: Home              Anticipated d/c is to: Home              Anticipated d/c date is: 3 days              Patient currently is not medically stable to d/c.   Difficult to place patient No   DVT Prophylaxis  :  Heparin    Lab Results  Component Value Date   PLT 258 11/21/2020    Diet :  Diet Order            DIET SOFT Room service appropriate? Yes; Fluid consistency: Thin  Diet effective now                  Inpatient Medications  Scheduled Meds: . atorvastatin  40 mg Oral Daily  . clopidogrel  75 mg Oral Daily  . diphenoxylate-atropine  1 tablet Oral TID  . enoxaparin (LOVENOX) injection  60 mg Subcutaneous Daily  . feeding supplement  237 mL Oral BID BM  . hydrALAZINE  50 mg Oral Q8H  . insulin aspart  0-9 Units Subcutaneous TID WC  . lipase/protease/amylase  36,000 Units Oral Daily  . metoprolol tartrate  100 mg Oral BID  . multivitamin with minerals  1 tablet Oral Daily  . pantoprazole  40 mg Oral BID  . sodium bicarbonate  650 mg Oral TID   Continuous Infusions: . sodium chloride    . levETIRAcetam 500 mg (11/21/20 0124)  . magnesium sulfate bolus IVPB     PRN Meds:.acetaminophen **OR** [DISCONTINUED] acetaminophen, cyclobenzaprine, hydrALAZINE, loperamide, melatonin, metoprolol tartrate, [DISCONTINUED] ondansetron **OR** ondansetron (ZOFRAN) IV, traMADol  Antibiotics  :    Anti-infectives (From admission, onward)   None       Time Spent in minutes  30   Lala Lund M.D on 11/21/2020 at 10:19 AM  To page go  to [email or web address]   [facility] -  Office  [phone]    See all Orders from today for further details    Objective:   Vitals:   11/20/20 1558 11/20/20 2153 11/21/20 0557 11/21/20 1011  BP: 112/61 (!) 128/55 (!) 128/48 (!) 114/49  Pulse: 69 75 68 62  Resp: 18 20 20 18   Temp: 98.4 F (36.9 C) 97.9 F (36.6 C) (!) 97.4 F (36.3 C) 97.7 F (36.5 C)  TempSrc:      SpO2: 94% 95% 96% 95%  Weight:      Height:        Wt Readings from Last 3 Encounters:  11/17/20 78.4 kg  08/08/20 80.4 kg  06/27/20 78.5 kg     Intake/Output Summary (Last 24 hours) at 11/21/2020 1019 Last data filed at 11/21/2020 0800 Gross per 24 hour  Intake 4842.92 ml  Output 2875 ml  Net 1967.92 ml     Physical Exam  Awake Alert, No new F.N deficits, Normal affect [place].AT,PERRAL Supple Neck,No JVD, No cervical lymphadenopathy appriciated.  Symmetrical Chest wall movement, Good air movement bilaterally, CTAB RRR,No Gallops, Rubs or new Murmurs, No Parasternal Heave +ve B.Sounds, Abd Soft, No tenderness, R. Sided ileostomy bag in place. No Cyanosis, Clubbing or edema, No new Rash or bruise     Data Review:    CBC Recent Labs  Lab 11/17/20 0706 11/18/20 0713 11/19/20 0759 11/20/20 0433 11/21/20 0726  WBC 10.0 9.8 15.0* 13.2* 12.7*  HGB 8.6* 8.9* 10.7* 8.7* 8.2*  HCT 26.2* 28.5* 33.2* 26.6* 24.3*  PLT 233 254 325 282 258  MCV 77.3* 80.5 79.4* 78.9* 79.2*  MCH 25.4* 25.1* 25.6* 25.8* 26.7  MCHC 32.8 31.2 32.2 32.7 33.7  RDW 17.9* 18.1* 18.6* 18.9* 19.0*  LYMPHSABS 2.4 3.1 1.1 1.6 1.8  MONOABS 0.9 1.0 0.5 0.7 1.3*  EOSABS 0.4 0.5 0.3 0.2 0.2  BASOSABS 0.0 0.0 0.0 0.0 0.0    Recent Labs  Lab 11/15/20 1153 11/16/20 0111 11/16/20 0300 11/17/20 0706 11/17/20 0734 11/17/20 1218 11/18/20 0713 11/19/20 0759 11/20/20 0433 11/21/20 0726  NA 124* 124*   < > 128*  --   --  128* 128* 126* 127*  K 6.0* 5.9*   < > 4.7  --   --  5.1  4.0 3.8 4.1  CL 98 97*   < > 100  --   --  100 97* 97* 99  CO2 13* 12*   < > 20*  --   --  19* 20* 22 19*  GLUCOSE 126* 131*   < > 91  --   --  98 87 102* 92  BUN 47* 56*   < > 41*  --   --  39* 26* 21 18  CREATININE 3.29* 4.26*   < > 3.27*  --   --  2.62* 1.83* 2.15* 1.92*  CALCIUM 8.8* 9.4   < > 8.0*  --   --  8.0* 8.2* 7.7* 7.8*  AST 43*  --   --   --   --   --  27 35 24 23  ALT 24  --   --   --   --   --  22 21 16 16   ALKPHOS 139*  --   --   --   --   --  121 137* 117 131*  BILITOT 0.9  --   --   --   --   --  0.7 0.7 0.4 0.8  ALBUMIN  2.9*  --   --   --   --   --  2.2* 2.2* 1.7* 1.7*  MG  --   --   --  1.2*  --   --  1.6* 2.0 2.0 1.6*  CRP  --   --   --  1.1*  --   --  1.2* 4.7* 10.1* 8.5*  DDIMER  --   --   --  2.61*  --   --  3.29* 6.12* 8.12* 6.97*  LATICACIDVEN  --  5.5*  --   --   --   --   --   --   --   --   TSH  --   --   --   --   --  0.775  --   --   --   --   HGBA1C  --   --   --  6.0*  --   --   --   --   --   --   BNP  --   --   --   --  332.9*  --  186.9* 446.6* 157.8* 173.1*   < > = values in this interval not displayed.    ------------------------------------------------------------------------------------------------------------------ No results for input(s): CHOL, HDL, LDLCALC, TRIG, CHOLHDL, LDLDIRECT in the last 72 hours.  Lab Results  Component Value Date   HGBA1C 6.0 (H) 11/17/2020   ------------------------------------------------------------------------------------------------------------------ No results for input(s): TSH, T4TOTAL, T3FREE, THYROIDAB in the last 72 hours.  Invalid input(s): FREET3  Cardiac Enzymes No results for input(s): CKMB, TROPONINI, MYOGLOBIN in the last 168 hours.  Invalid input(s): CK ------------------------------------------------------------------------------------------------------------------    Component Value Date/Time   BNP 173.1 (H) 11/21/2020 0726    Micro Results Recent Results (from the past 240 hour(s))   SARS CORONAVIRUS 2 (TAT 6-24 HRS) Nasopharyngeal     Status: Abnormal   Collection Time: 11/16/20  9:36 AM   Specimen: Nasopharyngeal  Result Value Ref Range Status   SARS Coronavirus 2 POSITIVE (A) NEGATIVE Final    Comment: (NOTE) SARS-CoV-2 target nucleic acids are DETECTED.  The SARS-CoV-2 RNA is generally detectable in upper and lower respiratory specimens during the acute phase of infection. Positive results are indicative of the presence of SARS-CoV-2 RNA. Clinical correlation with patient history and other diagnostic information is  necessary to determine patient infection status. Positive results do not rule out bacterial infection or co-infection with other viruses.  The expected result is Negative.  Fact Sheet for Patients: SugarRoll.be  Fact Sheet for Healthcare Providers: https://www.woods-mathews.com/  This test is not yet approved or cleared by the [place] FDA and  has been authorized for detection and/or diagnosis of SARS-CoV-2 by FDA under an Emergency Use Authorization (EUA). This EUA will remain  in effect (meaning this test can be used) for the duration of the COVID-19 declaration under Section 564(b)(1) of the Act, 21 U. S.C. section 360bbb-3(b)(1), unless the authorization is terminated or revoked sooner.   Performed at [facility], [place] [address] [place], [place] [address]     Radiology Reports CT ABDOMEN PELVIS WO CONTRAST  Result Date: 11/16/2020 CLINICAL DATA:  Abdominal pain nausea EXAM: CT ABDOMEN AND PELVIS WITHOUT CONTRAST TECHNIQUE: Multidetector CT imaging of the abdomen and pelvis was performed following the standard protocol without IV contrast. COMPARISON:  August 23rd 2021 FINDINGS: Lower chest: The visualized heart size within normal limits. No pericardial fluid/thickening. There is a small hiatal hernia present. The visualized portions of the  lungs are clear. Hepatobiliary: Although  limited due to the lack of intravenous contrast, normal in appearance without gross focal abnormality. No evidence of calcified gallstones or biliary ductal dilatation. Pancreas:  Unremarkable.  No surrounding inflammatory changes. Spleen: Normal in size. Although limited due to the lack of intravenous contrast, normal in appearance. Adrenals/Urinary Tract: Both adrenal glands appear normal. There is a punctate calcifications seen within the upper pole of the left kidney. No right-sided renal or collecting system calculi are seen. Bladder is unremarkable. Stomach/Bowel: A small hiatal hernia is present, however otherwise unremarkable stomach is seen. A right anterior abdominal wall ileostomy is noted. There is been a prior right hemicolectomy wound seen. No dilated loops of bowel are noted. Vascular/Lymphatic: There are no enlarged abdominal or pelvic lymph nodes. Scattered aortic atherosclerotic calcifications are seen without aneurysmal dilatation. Reproductive: The patient is status post hysterectomy. No adnexal masses or collections seen. Other: There are 2 fat containing the upper anterior abdominal wall hernia is present there is also a stable left anterior abdominal wall parastomal hernia containing loops small bowel. Musculoskeletal: No acute or significant osseous findings. Degenerative changes are seen throughout the lumbar spine. 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 No definite evidence of obstruction. Punctate nonobstructing left renal calculi Aortic Atherosclerosis (ICD10-I70.0). Electronically Signed   By: Prudencio Pair M.D.   On: 11/16/2020 00:59   US RENAL  Result Date: 11/16/2020 CLINICAL DATA:  Acute kidney injury EXAM: RENAL / URINARY TRACT ULTRASOUND COMPLETE COMPARISON:  CT 11/16/2020, ultrasound 09/12/2016 FINDINGS: Right Kidney: Renal measurements: 9.1 x 4.0 x 4.0 cm = volume: 75 mL. Echogenicity is within normal limits. No concerning renal mass,  shadowing calculus or hydronephrosis. Left Kidney: Renal measurements: 9.2 x 5.2 x 3.5 cm = volume: 86 mL. Echogenicity is within normal limits. No concerning renal mass, visible shadowing calculus or hydronephrosis. Few punctate calcifications seen in the upper pole left kidney on CT imaging are not well visualized on this sonographic exam. Bladder: Appears normal for degree of bladder distention. Other: None. IMPRESSION: Unremarkable renal ultrasound. Previously seen punctate upper pole left renal calculi are not well visualized. Electronically Signed   By: Lovena Le M.D.   On: 11/16/2020 03:23   DG Chest Port 1 View  Result Date: 11/20/2020 CLINICAL DATA:  Shortness of breath, COVID positive EXAM: PORTABLE CHEST 1 VIEW COMPARISON:  11/17/2020 FINDINGS: Bilateral lower lobe opacities, likely reflecting a combination of atelectasis/pneumonia with small pleural effusions. No pneumothorax. The heart is in size.  Thoracic aortic atherosclerosis. IMPRESSION: Bilateral lower lobe opacities, likely reflecting a combination of atelectasis/pneumonia with small pleural effusions. Electronically Signed   By: Julian Hy M.D.   On: 11/20/2020 08:44   DG CHEST PORT 1 VIEW  Result Date: 11/17/2020 CLINICAL DATA:  COVID-19 positivity EXAM: PORTABLE CHEST 1 VIEW COMPARISON:  10/21/2019 FINDINGS: Cardiac shadow is within normal limits. Aortic calcifications are seen. The lungs are well aerated bilaterally. No focal infiltrate is seen. No bony abnormality is noted. IMPRESSION: No acute abnormality noted. Electronically Signed   By: Inez Catalina M.D.   On: 11/17/2020 01:59   VAS [place] LOWER EXTREMITY VENOUS (DVT)  Result Date: 11/19/2020  Lower Venous DVT Study Indications: Covid, elevated d-dimer.  Comparison Study: No prior studies. Performing Technologist: Darlin Coco RDMS  Examination Guidelines: A complete evaluation includes B-mode imaging, spectral Doppler, color Doppler, and power Doppler as needed of all  accessible portions of each vessel. Bilateral testing is considered an integral part of  a complete examination. Limited examinations for reoccurring indications may be performed as noted. The reflux portion of the exam is performed with the patient in reverse Trendelenburg.  +---------+---------------+---------+-----------+----------+--------------+ RIGHT    CompressibilityPhasicitySpontaneityPropertiesThrombus Aging +---------+---------------+---------+-----------+----------+--------------+ CFV      Full           Yes      Yes                                 +---------+---------------+---------+-----------+----------+--------------+ SFJ      Full                                                        +---------+---------------+---------+-----------+----------+--------------+ FV Prox  Full                                                        +---------+---------------+---------+-----------+----------+--------------+ FV Mid   Full                                                        +---------+---------------+---------+-----------+----------+--------------+ FV DistalFull                                                        +---------+---------------+---------+-----------+----------+--------------+ PFV      Full                                                        +---------+---------------+---------+-----------+----------+--------------+ POP      Full           Yes      Yes                                 +---------+---------------+---------+-----------+----------+--------------+ PTV      Full                                                        +---------+---------------+---------+-----------+----------+--------------+ PERO     Full                                                        +---------+---------------+---------+-----------+----------+--------------+   +---------+---------------+---------+-----------+----------+--------------+  LEFT     CompressibilityPhasicitySpontaneityPropertiesThrombus Aging +---------+---------------+---------+-----------+----------+--------------+ CFV      Full  Yes      Yes                                 +---------+---------------+---------+-----------+----------+--------------+ SFJ      Full                                                        +---------+---------------+---------+-----------+----------+--------------+ FV Prox  Full                                                        +---------+---------------+---------+-----------+----------+--------------+ FV Mid   Full                                                        +---------+---------------+---------+-----------+----------+--------------+ FV DistalFull                                                        +---------+---------------+---------+-----------+----------+--------------+ PFV      Full                                                        +---------+---------------+---------+-----------+----------+--------------+ POP      Full           Yes      Yes                                 +---------+---------------+---------+-----------+----------+--------------+ PTV      Full                                                        +---------+---------------+---------+-----------+----------+--------------+ PERO     Full                                                        +---------+---------------+---------+-----------+----------+--------------+     Summary: RIGHT: - There is no evidence of deep vein thrombosis in the lower extremity.  - No cystic structure found in the popliteal fossa.  LEFT: - There is no evidence of deep vein thrombosis in the lower extremity.  - No cystic structure found in the popliteal fossa.  *See table(s) above for measurements and observations. Electronically signed by Monica Martinez MD on 11/19/2020 at 7:54:32 PM.  Final     ECHOCARDIOGRAM LIMITED  Result Date: 11/17/2020    ECHOCARDIOGRAM LIMITED REPORT   Patient Name:   ASUSENA SIGLEY Date of Exam: 11/17/2020 Medical Rec #:  [id number]      Height:       63.0 in Accession #:    [id number]     Weight:       173.1 lb Date of Birth:  April 04, 1946      BSA:          1.818 m Patient Age:    83 years       BP:           159/66 mmHg Patient Gender: F              HR:           101 bpm. Exam Location:  Inpatient Procedure: Cardiac Doppler, Color Doppler, Limited Echo and Intracardiac            Opacification Agent Indications:    CHF-Acute Diastolic  History:        Patient has prior history of Echocardiogram examinations. Risk                 Factors:Former Smoker. CKD.  Sonographer:    Clayton Lefort RDCS (AE) Referring Phys: [id number] [place]  1. Left ventricular ejection fraction, by estimation, is 45 to 50%. The left ventricle has mildly decreased function. The left ventricle demonstrates regional wall motion abnormalities (see scoring diagram/findings for description). There is moderate left ventricular hypertrophy.  2. The mitral valve was not well visualized. No evidence of mitral valve regurgitation.  3. The aortic valve is tricuspid. Mild aortic valve sclerosis is present, with no evidence of aortic valve stenosis. Comparison(s): Prior images reviewed side by side. Changes from prior study are noted. FINDINGS  Left Ventricle: Left ventricular ejection fraction, by estimation, is 45 to 50%. The left ventricle has mildly decreased function. The left ventricle demonstrates regional wall motion abnormalities. Definity contrast agent was given IV to delineate the left ventricular endocardial borders. There is moderate left ventricular hypertrophy.  LV Wall Scoring: The entire apex is akinetic. Mitral Valve: The mitral valve was not well visualized. Tricuspid Valve: The tricuspid valve is not well visualized. Tricuspid valve regurgitation is not demonstrated. Aortic Valve: The  aortic valve is tricuspid. Mild aortic valve sclerosis is present, with no evidence of aortic valve stenosis. Aortic valve mean gradient measures 2.0 mmHg. Aortic valve peak gradient measures 4.8 mmHg. Aortic valve area, by VTI measures  2.01 cm. LEFT VENTRICLE PLAX 2D LVIDd:         4.10 cm  Diastology LVIDs:         2.50 cm  LV e' medial:    3.05 cm/s LV PW:         1.50 cm  LV E/e' medial:  17.1 LV IVS:        1.60 cm  LV e' lateral:   6.64 cm/s LVOT diam:     2.00 cm  LV E/e' lateral: 7.8 LV SV:         39 LV SV Index:   21 LVOT Area:     3.14 cm  IVC IVC diam: 1.50 cm LEFT ATRIUM         Index LA diam:    2.90 cm 1.59 cm/m  AORTIC VALVE AV Area (Vmax):    2.08 cm AV Area (Vmean):   2.22 cm AV Area (VTI):  2.01 cm AV Vmax:           109.00 cm/s AV Vmean:          62.200 cm/s AV VTI:            0.192 m AV Peak Grad:      4.8 mmHg AV Mean Grad:      2.0 mmHg LVOT Vmax:         72.10 cm/s LVOT Vmean:        44.000 cm/s LVOT VTI:          0.123 m LVOT/AV VTI ratio: 0.64  AORTA Ao Root diam: 2.90 cm Ao Asc diam:  3.10 cm MITRAL VALVE MV Area (PHT): 2.83 cm     SHUNTS MV Decel Time: 268 msec     Systemic VTI:  0.12 m MV E velocity: 52.10 cm/s   Systemic Diam: 2.00 cm MV A velocity: 114.00 cm/s MV E/A ratio:  0.46 Candee Furbish MD Electronically signed by Candee Furbish MD Signature Date/Time: 11/17/2020/5:06:53 PM    Final

## 2020-11-21 NOTE — Progress Notes (Signed)
Physical Therapy Treatment Patient Details Name: [name] MRN: [id number] DOB: 10/01/1946 Today's Date: 11/21/2020    History of Present Illness 75yo female presenting with 10 day hx of nausea and vomiting. Found to be covid positive. Admitted with metabolic acidosis, NAG, and failure of bicarbonate regeneration. PMH CAD, clotting disorder, DM, chronic dizziness, HLD, DOE, HTN, seizures, CVA, femoral artery stent, patella realignment, colostomy    PT Comments    Pt received in bed, cooperative and willing to participate in PT. Reported that she doesn't feel well today, increased discomfort and nausea. Pt was supervision for bed mobility and min guard for transfers and ambulation ~40 ft. Pt kept reaching out for the walls during ambulation and needed 2 sitting rest breaks  for 2-3 minutes due to fatigue. SPO2 > 95% throughout session. Decreased awareness and problem solving. Increased confusion compared to previous sessions. Pt left in bed with call bell in reach, all needs met, and RN aware of status.     Follow Up Recommendations  Home health PT;Supervision for mobility/OOB     Equipment Recommendations  Rolling walker with 5" wheels;Other (comment) (shower bench)    Recommendations for Other Services       Precautions / Restrictions Precautions Precautions: Fall;Other (comment) Precaution Comments: colostomy, covid+ Restrictions Weight Bearing Restrictions: No    Mobility  Bed Mobility Overal bed mobility: Modified Independent Bed Mobility: Supine to Sit;Sit to Supine     Supine to sit: Supervision;HOB elevated Sit to supine: Supervision;HOB elevated      Transfers Overall transfer level: Needs assistance Equipment used: None Transfers: Sit to/from Stand Sit to Stand: Min guard            Ambulation/Gait Ambulation/Gait assistance: Min guard Gait Distance (Feet): 40 Feet (Took 2 rest breaks) Assistive device: None Gait Pattern/deviations: Step-through  pattern;Decreased step length - right;Decreased step length - left;Drifts right/left;Trunk flexed Gait velocity: decreased   General Gait Details: Slow, more unsteady today, was able to walk to other side of bed but needed rest break, walked 1 lap in room and then to bathroom and back to bed, SpO2 > 95% on RA   Stairs             Wheelchair Mobility    Modified Rankin (Stroke Patients Only)       Balance Overall balance assessment: Needs assistance;History of Falls Sitting-balance support: Feet supported;No upper extremity supported Sitting balance-Leahy Scale: Good       Standing balance-Leahy Scale: Fair                              Cognition Arousal/Alertness: Awake/alert Behavior During Therapy: WFL for tasks assessed/performed Overall Cognitive Status: Impaired/Different from baseline Area of Impairment: Memory;Problem solving;Safety/judgement;Awareness                     Memory: Decreased short-term memory   Safety/Judgement: Decreased awareness of safety;Decreased awareness of deficits Awareness: Emergent Problem Solving: Requires tactile cues General Comments: Slower than usual to respond      Exercises      General Comments General comments (skin integrity, edema, etc.): SpO2 >95% on RA      Pertinent Vitals/Pain Pain Assessment: Faces Faces Pain Scale: Hurts little more Pain Location: generalized discomfort and nausea Pain Descriptors / Indicators: Discomfort Pain Intervention(s): Monitored during session    Home Living  Prior Function            PT Goals (current goals can now be found in the care plan section) Acute Rehab PT Goals Patient Stated Goal: go home Time For Goal Achievement: 12/01/20 Potential to Achieve Goals: Good Progress towards PT goals: Progressing toward goals    Frequency    Min 3X/week      PT Plan Current plan remains appropriate    Co-evaluation               AM-PAC PT "6 Clicks" Mobility   Outcome Measure  Help needed turning from your back to your side while in a flat bed without using bedrails?: None Help needed moving from lying on your back to sitting on the side of a flat bed without using bedrails?: None Help needed moving to and from a bed to a chair (including a wheelchair)?: A Little Help needed standing up from a chair using your arms (e.g., wheelchair or bedside chair)?: A Little Help needed to walk in hospital room?: A Little Help needed climbing 3-5 steps with a railing? : A Little 6 Click Score: 20    End of Session   Activity Tolerance: Patient tolerated treatment well Patient left: in bed;with call bell/phone within reach;with bed alarm set (bed in chair position) Nurse Communication: Mobility status;Other (comment) (colostomy needs to be emptied) PT Visit Diagnosis: Unsteadiness on feet (R26.81);Muscle weakness (generalized) (M62.81);History of falling (Z91.81)     Time:  -     Charges:                        Rosita Kea, SPT

## 2020-11-22 DIAGNOSIS — D508 Other iron deficiency anemias: Secondary | ICD-10-CM

## 2020-11-22 LAB — COMPREHENSIVE METABOLIC PANEL
ALT: 20 U/L (ref 0–44)
AST: 28 U/L (ref 15–41)
Albumin: 1.6 g/dL — ABNORMAL LOW (ref 3.5–5.0)
Alkaline Phosphatase: 134 U/L — ABNORMAL HIGH (ref 38–126)
Anion gap: 6 (ref 5–15)
BUN: 17 mg/dL (ref 8–23)
CO2: 20 mmol/L — ABNORMAL LOW (ref 22–32)
Calcium: 7.7 mg/dL — ABNORMAL LOW (ref 8.9–10.3)
Chloride: 103 mmol/L (ref 98–111)
Creatinine, Ser: 1.88 mg/dL — ABNORMAL HIGH (ref 0.44–1.00)
GFR, Estimated: 28 mL/min — ABNORMAL LOW (ref >60)
Glucose, Bld: 101 mg/dL — ABNORMAL HIGH (ref 70–99)
Potassium: 4.5 mmol/L (ref 3.5–5.1)
Sodium: 129 mmol/L — ABNORMAL LOW (ref 135–145)
Total Bilirubin: 0.5 mg/dL (ref 0.3–1.2)
Total Protein: 4.1 g/dL — ABNORMAL LOW (ref 6.5–8.1)

## 2020-11-22 LAB — VITAMIN B12: Vitamin B-12: 896 pg/mL (ref 180–914)

## 2020-11-22 LAB — CBC WITH DIFFERENTIAL/PLATELET
Abs Immature Granulocytes: 0.06 10*3/uL (ref 0.00–0.07)
Basophils Absolute: 0 10*3/uL (ref 0.0–0.1)
Basophils Relative: 0 %
Eosinophils Absolute: 0.4 10*3/uL (ref 0.0–0.5)
Eosinophils Relative: 3 %
HCT: 23.5 % — ABNORMAL LOW (ref 36.0–46.0)
Hemoglobin: 7.8 g/dL — ABNORMAL LOW (ref 12.0–15.0)
Immature Granulocytes: 1 %
Lymphocytes Relative: 14 %
Lymphs Abs: 1.5 10*3/uL (ref 0.7–4.0)
MCH: 26.4 pg (ref 26.0–34.0)
MCHC: 33.2 g/dL (ref 30.0–36.0)
MCV: 79.7 fL — ABNORMAL LOW (ref 80.0–100.0)
Monocytes Absolute: 1 10*3/uL (ref 0.1–1.0)
Monocytes Relative: 10 %
Neutro Abs: 7.7 10*3/uL (ref 1.7–7.7)
Neutrophils Relative %: 72 %
Platelets: 285 10*3/uL (ref 150–400)
RBC: 2.95 MIL/uL — ABNORMAL LOW (ref 3.87–5.11)
RDW: 19.3 % — ABNORMAL HIGH (ref 11.5–15.5)
WBC: 10.6 10*3/uL — ABNORMAL HIGH (ref 4.0–10.5)
nRBC: 0 % (ref 0.0–0.2)

## 2020-11-22 LAB — FERRITIN: Ferritin: 27 ng/mL (ref 11–307)

## 2020-11-22 LAB — IRON AND TIBC
Iron: 13 ug/dL — ABNORMAL LOW (ref 28–170)
Saturation Ratios: 6 % — ABNORMAL LOW (ref 10.4–31.8)
TIBC: 225 ug/dL — ABNORMAL LOW (ref 250–450)
UIBC: 212 ug/dL

## 2020-11-22 LAB — RETICULOCYTES
Immature Retic Fract: 26.7 % — ABNORMAL HIGH (ref 2.3–15.9)
RBC.: 3.22 MIL/uL — ABNORMAL LOW (ref 3.87–5.11)
Retic Count, Absolute: 79.9 10*3/uL (ref 19.0–186.0)
Retic Ct Pct: 2.5 % (ref 0.4–3.1)

## 2020-11-22 LAB — GLUCOSE, CAPILLARY
Glucose-Capillary: 90 mg/dL (ref 70–99)
Glucose-Capillary: 127 mg/dL — ABNORMAL HIGH (ref 70–99)
Glucose-Capillary: 127 mg/dL — ABNORMAL HIGH (ref 70–99)
Glucose-Capillary: 111 mg/dL — ABNORMAL HIGH (ref 70–99)

## 2020-11-22 LAB — FOLATE: Folate: 8.7 ng/mL (ref >5.9)

## 2020-11-22 LAB — MAGNESIUM: Magnesium: 2.2 mg/dL (ref 1.7–2.4)

## 2020-11-22 LAB — D-DIMER, QUANTITATIVE: D-Dimer, Quant: 4.69 ug/mL-FEU — ABNORMAL HIGH (ref 0.00–0.50)

## 2020-11-22 LAB — BRAIN NATRIURETIC PEPTIDE: B Natriuretic Peptide: 227.2 pg/mL — ABNORMAL HIGH (ref 0.0–100.0)

## 2020-11-22 LAB — C-REACTIVE PROTEIN: CRP: 6.3 mg/dL — ABNORMAL HIGH (ref <1.0)

## 2020-11-22 MED ORDER — SODIUM CHLORIDE 1 G PO TABS
2.0000 g | ORAL_TABLET | Freq: Three times a day (TID) | ORAL | Status: DC
  Administered 2020-11-22 – 2020-11-23 (×3): 2 g via ORAL
  Filled 2020-11-22 (×5): qty 2

## 2020-11-22 MED ORDER — SODIUM CHLORIDE 0.9 % IV SOLN
125.0000 mg | Freq: Once | INTRAVENOUS | Status: AC
  Administered 2020-11-22: 125 mg via INTRAVENOUS
  Filled 2020-11-22: qty 10

## 2020-11-22 MED ORDER — LOPERAMIDE HCL 2 MG PO CAPS
2.0000 mg | ORAL_CAPSULE | Freq: Two times a day (BID) | ORAL | Status: DC
  Administered 2020-11-22 – 2020-11-23 (×3): 2 mg via ORAL
  Filled 2020-11-22 (×3): qty 1

## 2020-11-22 MED ORDER — SODIUM CHLORIDE 0.9 % IV SOLN: INTRAVENOUS | Status: DC

## 2020-11-22 MED ORDER — SODIUM CHLORIDE 0.9 % IV SOLN: 510.0000 mg | Freq: Once | INTRAVENOUS | Status: DC

## 2020-11-22 NOTE — Progress Notes (Signed)
PROGRESS NOTE                                                                                                                                                                                                             Patient Demographics:    [name], is a 75 y.o. female, DOB - 01-Jul-1946, SWN:[id number]  Outpatient Primary MD for the patient is Joanna Battles, MD    LOS - 6  Admit date - 11/15/2020    Chief Complaint  Patient presents with  . Vomiting       Brief Narrative (HPI from H&P) [name] is a 75 y.o. 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  She was found to have AKI hyperkalemia, dehydration and admitted to the hospital.  With supportive care she is much better, once sodium and magnesium levels improved she can be discharged home with home health PT, appropriate orders have been placed including for DME.  New note per daughter Abilify has to be discontinued, Flexeril dose has to be reduced upon discharge.    Subjective:   Patient in bed, appears comfortable, denies any headache, no fever, any chest pain or shortness of breath, no focal deficits, does report some mild nausea, but reports appetite is good.   Assessment  & Plan :     Abd Pain, acute on chronic diarrhea, has history of high output ileostomy.  - Hydrate with IV fluids, on scheduled Creon, Lomotil and as needed Imodium, 2 doses of Tincher of opium.  CT abdomen pelvis nonacute ,exam benign.  Abdominal pain completely resolved on 11/20/2020 but lipase is elevated, this could be nonspecific of chronic pancreatitis, she is on pancreatic enzymes  on a chronic basis but denies any knowledge of chronic pancreatitis, will monitor clinically -Ileostomy output has significantly subsided, it is 1800 cc over last 24 hours.  We will decrease her IV fluids to 75 cc/h. -Change Imodium to scheduled twice daily  Lipase     Component Value Date/Time   LIPASE 268 (H) 11/21/2020 0726    Diarrhea induced severe  dehydration, hyponatremia, AKI, hyperkalemia, non-anion gap metabolic acidosis.   -Management of diarrhea as above, AKI, hyperkalemia have resolved and acidosis is improving, underlying CKD 3B with baseline creatinine around 1.5.   -Natremia improving  Acute Covid 19 infection   - CXR stable, no SOB, monitor, fully Vaccinated- boosted.  Encouraged the patient to sit up in chair in the daytime use I-S and flutter valve for pulmonary toiletry and then prone in bed when at night.  Will advance activity and titrate down oxygen as possible.   Recent Labs  Lab 11/16/20 0111 11/16/20 0300 11/16/20 0936 11/17/20 0706 11/18/20 0713 11/19/20 0759 11/20/20 0433 11/21/20 0726 11/22/20 0235  WBC  --    < >  --    < > 9.8 15.0* 13.2* 12.7* 10.6*  HGB  --    < >  --    < > 8.9* 10.7* 8.7* 8.2* 7.8*  HCT  --    < >  --    < > 28.5* 33.2* 26.6* 24.3* 23.5*  PLT  --    < >  --    < > 254 325 282 258 285  CRP  --   --   --    < > 1.2* 4.7* 10.1* 8.5* 6.3*  BNP  --   --   --    < > 186.9* 446.6* 157.8* 173.1* 227.2*  DDIMER  --   --   --    < > 3.29* 6.12* 8.12* 6.97* 4.69*  AST  --   --   --   --  27 35 24 23 28   ALT  --   --   --   --  22 21 16 16 20   ALKPHOS  --   --   --   --  121 137* 117 131* 134*  BILITOT  --   --   --   --  0.7 0.7 0.4 0.8 0.5  ALBUMIN  --   --   --   --  2.2* 2.2* 1.7* 1.7* 1.6*  LATICACIDVEN 5.5*  --   --   --   --   --   --   --   --   SARSCOV2NAA  --   --  POSITIVE*  --   --   --   --   --   --    < > = values in this interval not displayed.     History of bowel ischemia requiring small bowel resection and how  output ileostomy.   -Supportive care, ostomy nurses following.  History of seizures.   -Due to ongoing diarrhea and questionable absorption for now IV Keppra instead of oral.  Sinus tachycardia and hypertension.   -On beta-blocker Norvasc monitor.  Hydrate.  History of CAD.   -On beta-blocker, dual antiplatelet therapy and Lipitor, no acute issues.  Hypomagnesemia. -  Replaced IV 4 gm & monitor.  Anemia of iron deficiency -Iron low at 13, ferritin low at 27, A12 and folic acid within normal limit, will give 1 IV iron transfusion, this is most likely in the setting of malabsorption due to diarrhea.  HTN  - on Lopressor, DC Norvasc & monitor.  Nonspecific elevation of lipase. -  No abdominal pain, CT scan does not show any acute or chronic changes, interestingly she is on chronic Creon supplement but does not know of any history of pancreatic issues, since CT scan is stable and there is no pain will not check it further.  Rising D-dimer, - high risk for developing DVT, moderate dose Lovenox, negative leg ultrasound.  ncreased deconditioning and somnolence at home.  Per daughter this all started since she started taking Abilify, Abilify will be stopped and Flexeril dose will be reduced (note upon DC).  Per daughter there is a unlisted pain medicine that was recently prescribed by PCP office and this needs to be discontinued permanently, will request PCP not to prescribe any narcotics unless delay needed.  HHPT and DME  Ordered.       Condition - Fair  Family Communication  :  Daughter [name] - [phone], 11/17/20, 11/18/20, 11/19/20, 11/20/20, I have discussed with patient at length, answered all her questions today  Code Status :  Full  Consults  :  None   Procedures  :      TTE - 1. Left ventricular ejection fraction, by estimation, is 45 to 50%. The left ventricle has mildly decreased function. The left ventricle demonstrates regional wall motion abnormalities (see scoring  diagram/findings for description). There is moderate left ventricular hypertrophy.  2. The mitral valve was not well visualized. No evidence of mitral valve regurgitation.  3. The aortic valve is tricuspid. Mild aortic valve sclerosis is present, with no evidence of aortic valve stenosis. Comparison(s): Prior images reviewed side by side. Changes from prior study are noted.  Renal US - Non acute.  CT - Right lower abdominal wall ileostomy with adjacent parastomal hernia. No definite evidence of obstruction. Punctate nonobstructing left renal calculi Aortic Atherosclerosis (ICD10-I70.0).      PUD Prophylaxis : PPI  Disposition Plan  :    Status is: Inpatient  Remains inpatient appropriate because:IV treatments appropriate due to intensity of illness or inability to take PO   Dispo: The patient is from: Home              Anticipated d/c is to: Home              Anticipated d/c date is: 2 days              Patient currently is not medically stable to d/c.   Difficult to place patient No   DVT Prophylaxis  :  Heparin    Lab Results  Component Value Date   PLT 285 11/22/2020    Diet :  Diet Order            DIET SOFT Room service appropriate? Yes; Fluid consistency: Thin  Diet effective now                  Inpatient Medications  Scheduled Meds: . atorvastatin  40 mg Oral Daily  . clopidogrel  75 mg Oral Daily  . diphenoxylate-atropine  1 tablet Oral TID  . enoxaparin (LOVENOX) injection  60 mg Subcutaneous Daily  . feeding supplement  237 mL Oral BID BM  . hydrALAZINE  50 mg Oral Q8H  . insulin aspart  0-9 Units Subcutaneous TID WC  . lipase/protease/amylase  36,000 Units Oral Daily  . metoprolol tartrate  100 mg Oral BID  . multivitamin with minerals  1 tablet Oral Daily  . pantoprazole  40 mg Oral BID  . sodium bicarbonate  650 mg Oral TID   Continuous Infusions: . levETIRAcetam 500 mg (11/22/20 0054)   PRN Meds:.acetaminophen **OR** [DISCONTINUED]  acetaminophen, cyclobenzaprine, hydrALAZINE, loperamide, melatonin, metoprolol tartrate, [DISCONTINUED] ondansetron **OR** ondansetron (ZOFRAN) IV, traMADol  Antibiotics  :    Anti-infectives (From admission, onward)   None  Phillips Climes M.D on 11/22/2020 at 12:00 PM  To page go to [email or web address]   [facility] -  Office  [phone]    See all Orders from today for further details    Objective:   Vitals:   11/21/20 1716 11/21/20 2032 11/22/20 0527 11/22/20 0939  BP: (!) 105/38 (!) 107/47 (!) 155/54 (!) 141/57  Pulse: 62 66 68 68  Resp: 19 20 18 18   Temp: 97.8 F (36.6 C) 98.6 F (37 C) (!) 97.4 F (36.3 C) 98.9 F (37.2 C)  TempSrc:   Oral Oral  SpO2: 98% 97% 97% 96%  Weight:      Height:        Wt Readings from Last 3 Encounters:  11/17/20 78.4 kg  08/08/20 80.4 kg  06/27/20 78.5 kg     Intake/Output Summary (Last 24 hours) at 11/22/2020 1200 Last data filed at 11/22/2020 1000 Gross per 24 hour  Intake 4644.67 ml  Output 2200 ml  Net 2444.67 ml     Physical Exam  Awake Alert, Oriented X 3, No new F.N deficits, Normal affect Symmetrical Chest wall movement, Good air movement bilaterally, CTAB RRR,No Gallops,Rubs or new Murmurs, No Parasternal Heave +ve B.Sounds, Abd Soft, No tenderness, right iliostomy bag in place No Cyanosis, Clubbing or edema, No new Rash or bruise        Data Review:    CBC Recent Labs  Lab 11/18/20 0713 11/19/20 0759 11/20/20 0433 11/21/20 0726 11/22/20 0235  WBC 9.8 15.0* 13.2* 12.7* 10.6*  HGB 8.9* 10.7* 8.7* 8.2* 7.8*  HCT 28.5* 33.2* 26.6* 24.3* 23.5*  PLT 254 325 282 258 285  MCV 80.5 79.4* 78.9* 79.2* 79.7*  MCH 25.1* 25.6* 25.8* 26.7 26.4  MCHC 31.2 32.2 32.7 33.7 33.2  RDW 18.1* 18.6* 18.9* 19.0* 19.3*  LYMPHSABS 3.1 1.1 1.6 1.8 1.5  MONOABS 1.0 0.5 0.7 1.3* 1.0  EOSABS 0.5 0.3 0.2 0.2 0.4  BASOSABS 0.0 0.0 0.0 0.0 0.0    Recent Labs  Lab 11/16/20 0111 11/16/20 0300 11/17/20 0706  11/17/20 0734 11/17/20 1218 11/18/20 0713 11/19/20 0759 11/20/20 0433 11/21/20 0726 11/22/20 0235  NA 124*   < > 128*  --   --  128* 128* 126* 127* 129*  K 5.9*   < > 4.7  --   --  5.1 4.0 3.8 4.1 4.5  CL 97*   < > 100  --   --  100 97* 97* 99 103  CO2 12*   < > 20*  --   --  19* 20* 22 19* 20*  GLUCOSE 131*   < > 91  --   --  98 87 102* 92 101*  BUN 56*   < > 41*  --   --  39* 26* 21 18 17   CREATININE 4.26*   < > 3.27*  --   --  2.62* 1.83* 2.15* 1.92* 1.88*  CALCIUM 9.4   < > 8.0*  --   --  8.0* 8.2* 7.7* 7.8* 7.7*  AST  --   --   --   --   --  27 35 24 23 28   ALT  --   --   --   --   --  22 21 16 16 20   ALKPHOS  --   --   --   --   --  121 137* 117 131* 134*  BILITOT  --   --   --   --   --  0.7 0.7 0.4 0.8 0.5  ALBUMIN  --   --   --   --   --  2.2* 2.2* 1.7* 1.7* 1.6*  MG  --    < > 1.2*  --   --  1.6* 2.0 2.0 1.6* 2.2  CRP  --    < > 1.1*  --   --  1.2* 4.7* 10.1* 8.5* 6.3*  DDIMER  --    < > 2.61*  --   --  3.29* 6.12* 8.12* 6.97* 4.69*  LATICACIDVEN 5.5*  --   --   --   --   --   --   --   --   --   TSH  --   --   --   --  0.775  --   --   --   --   --   HGBA1C  --   --  6.0*  --   --   --   --   --   --   --   BNP  --   --   --    < >  --  186.9* 446.6* 157.8* 173.1* 227.2*   < > = values in this interval not displayed.    ------------------------------------------------------------------------------------------------------------------ No results for input(s): CHOL, HDL, LDLCALC, TRIG, CHOLHDL, LDLDIRECT in the last 72 hours.  Lab Results  Component Value Date   HGBA1C 6.0 (H) 11/17/2020   ------------------------------------------------------------------------------------------------------------------ No results for input(s): TSH, T4TOTAL, T3FREE, THYROIDAB in the last 72 hours.  Invalid input(s): FREET3  Cardiac Enzymes No results for input(s): CKMB, TROPONINI, MYOGLOBIN in the last 168 hours.  Invalid input(s):  CK ------------------------------------------------------------------------------------------------------------------    Component Value Date/Time   BNP 227.2 (H) 11/22/2020 0235    Micro Results Recent Results (from the past 240 hour(s))  SARS CORONAVIRUS 2 (TAT 6-24 HRS) Nasopharyngeal     Status: Abnormal   Collection Time: 11/16/20  9:36 AM   Specimen: Nasopharyngeal  Result Value Ref Range Status   SARS Coronavirus 2 POSITIVE (A) NEGATIVE Final    Comment: (NOTE) SARS-CoV-2 target nucleic acids are DETECTED.  The SARS-CoV-2 RNA is generally detectable in upper and lower respiratory specimens during the acute phase of infection. Positive results are indicative of the presence of SARS-CoV-2 RNA. Clinical correlation with patient history and other diagnostic information is  necessary to determine patient infection status. Positive results do not rule out bacterial infection or co-infection with other viruses.  The expected result is Negative.  Fact Sheet for Patients: SugarRoll.be  Fact Sheet for Healthcare Providers: https://www.woods-mathews.com/  This test is not yet approved or cleared by the [place] FDA and  has been authorized for detection and/or diagnosis of SARS-CoV-2 by FDA under an Emergency Use Authorization (EUA). This EUA will remain  in effect (meaning this test can be used) for the duration of the COVID-19 declaration under Section 564(b)(1) of the Act, 21 U. S.C. section 360bbb-3(b)(1), unless the authorization is terminated or revoked sooner.   Performed at [facility], [place] [address] [place], [place] [address]     Radiology Reports CT ABDOMEN PELVIS WO CONTRAST  Result Date: 11/16/2020 CLINICAL DATA:  Abdominal pain nausea EXAM: CT ABDOMEN AND PELVIS WITHOUT CONTRAST TECHNIQUE: Multidetector CT imaging of the abdomen and pelvis was performed following the standard protocol without IV contrast.  COMPARISON:  August 23rd 2021 FINDINGS: Lower chest: The visualized heart size within normal limits. No pericardial fluid/thickening. There is a small hiatal hernia  present. The visualized portions of the lungs are clear. Hepatobiliary: Although limited due to the lack of intravenous contrast, normal in appearance without gross focal abnormality. No evidence of calcified gallstones or biliary ductal dilatation. Pancreas:  Unremarkable.  No surrounding inflammatory changes. Spleen: Normal in size. Although limited due to the lack of intravenous contrast, normal in appearance. Adrenals/Urinary Tract: Both adrenal glands appear normal. There is a punctate calcifications seen within the upper pole of the left kidney. No right-sided renal or collecting system calculi are seen. Bladder is unremarkable. Stomach/Bowel: A small hiatal hernia is present, however otherwise unremarkable stomach is seen. A right anterior abdominal wall ileostomy is noted. There is been a prior right hemicolectomy wound seen. No dilated loops of bowel are noted. Vascular/Lymphatic: There are no enlarged abdominal or pelvic lymph nodes. Scattered aortic atherosclerotic calcifications are seen without aneurysmal dilatation. Reproductive: The patient is status post hysterectomy. No adnexal masses or collections seen. Other: There are 2 fat containing the upper anterior abdominal wall hernia is present there is also a stable left anterior abdominal wall parastomal hernia containing loops small bowel. Musculoskeletal: No acute or significant osseous findings. Degenerative changes are seen throughout the lumbar spine. 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 No definite evidence of obstruction. Punctate nonobstructing left renal calculi Aortic Atherosclerosis (ICD10-I70.0). Electronically Signed   By: Prudencio Pair M.D.   On: 11/16/2020 00:59   US RENAL  Result Date: 11/16/2020 CLINICAL DATA:  Acute kidney  injury EXAM: RENAL / URINARY TRACT ULTRASOUND COMPLETE COMPARISON:  CT 11/16/2020, ultrasound 09/12/2016 FINDINGS: Right Kidney: Renal measurements: 9.1 x 4.0 x 4.0 cm = volume: 75 mL. Echogenicity is within normal limits. No concerning renal mass, shadowing calculus or hydronephrosis. Left Kidney: Renal measurements: 9.2 x 5.2 x 3.5 cm = volume: 86 mL. Echogenicity is within normal limits. No concerning renal mass, visible shadowing calculus or hydronephrosis. Few punctate calcifications seen in the upper pole left kidney on CT imaging are not well visualized on this sonographic exam. Bladder: Appears normal for degree of bladder distention. Other: None. IMPRESSION: Unremarkable renal ultrasound. Previously seen punctate upper pole left renal calculi are not well visualized. Electronically Signed   By: Lovena Le M.D.   On: 11/16/2020 03:23   DG Chest Port 1 View  Result Date: 11/20/2020 CLINICAL DATA:  Shortness of breath, COVID positive EXAM: PORTABLE CHEST 1 VIEW COMPARISON:  11/17/2020 FINDINGS: Bilateral lower lobe opacities, likely reflecting a combination of atelectasis/pneumonia with small pleural effusions. No pneumothorax. The heart is in size.  Thoracic aortic atherosclerosis. IMPRESSION: Bilateral lower lobe opacities, likely reflecting a combination of atelectasis/pneumonia with small pleural effusions. Electronically Signed   By: Julian Hy M.D.   On: 11/20/2020 08:44   DG CHEST PORT 1 VIEW  Result Date: 11/17/2020 CLINICAL DATA:  COVID-19 positivity EXAM: PORTABLE CHEST 1 VIEW COMPARISON:  10/21/2019 FINDINGS: Cardiac shadow is within normal limits. Aortic calcifications are seen. The lungs are well aerated bilaterally. No focal infiltrate is seen. No bony abnormality is noted. IMPRESSION: No acute abnormality noted. Electronically Signed   By: Inez Catalina M.D.   On: 11/17/2020 01:59   VAS [place] LOWER EXTREMITY VENOUS (DVT)  Result Date: 11/19/2020  Lower Venous DVT Study Indications:  Covid, elevated d-dimer.  Comparison Study: No prior studies. Performing Technologist: Darlin Coco RDMS  Examination Guidelines: A complete evaluation includes B-mode imaging, spectral Doppler, color Doppler, and power Doppler as needed of all accessible portions of each vessel. Bilateral testing is  considered an integral part of a complete examination. Limited examinations for reoccurring indications may be performed as noted. The reflux portion of the exam is performed with the patient in reverse Trendelenburg.  +---------+---------------+---------+-----------+----------+--------------+ RIGHT    CompressibilityPhasicitySpontaneityPropertiesThrombus Aging +---------+---------------+---------+-----------+----------+--------------+ CFV      Full           Yes      Yes                                 +---------+---------------+---------+-----------+----------+--------------+ SFJ      Full                                                        +---------+---------------+---------+-----------+----------+--------------+ FV Prox  Full                                                        +---------+---------------+---------+-----------+----------+--------------+ FV Mid   Full                                                        +---------+---------------+---------+-----------+----------+--------------+ FV DistalFull                                                        +---------+---------------+---------+-----------+----------+--------------+ PFV      Full                                                        +---------+---------------+---------+-----------+----------+--------------+ POP      Full           Yes      Yes                                 +---------+---------------+---------+-----------+----------+--------------+ PTV      Full                                                         +---------+---------------+---------+-----------+----------+--------------+ PERO     Full                                                        +---------+---------------+---------+-----------+----------+--------------+   +---------+---------------+---------+-----------+----------+--------------+ LEFT     CompressibilityPhasicitySpontaneityPropertiesThrombus Aging +---------+---------------+---------+-----------+----------+--------------+ CFV  Full           Yes      Yes                                 +---------+---------------+---------+-----------+----------+--------------+ SFJ      Full                                                        +---------+---------------+---------+-----------+----------+--------------+ FV Prox  Full                                                        +---------+---------------+---------+-----------+----------+--------------+ FV Mid   Full                                                        +---------+---------------+---------+-----------+----------+--------------+ FV DistalFull                                                        +---------+---------------+---------+-----------+----------+--------------+ PFV      Full                                                        +---------+---------------+---------+-----------+----------+--------------+ POP      Full           Yes      Yes                                 +---------+---------------+---------+-----------+----------+--------------+ PTV      Full                                                        +---------+---------------+---------+-----------+----------+--------------+ PERO     Full                                                        +---------+---------------+---------+-----------+----------+--------------+     Summary: RIGHT: - There is no evidence of deep vein thrombosis in the lower extremity.  - No cystic structure found in  the popliteal fossa.  LEFT: - There is no evidence of deep vein thrombosis in the lower extremity.  - No cystic structure found in the popliteal fossa.  *See table(s) above for measurements and  observations. Electronically signed by Monica Martinez MD on 11/19/2020 at 7:54:32 PM.    Final    ECHOCARDIOGRAM LIMITED  Result Date: 11/17/2020    ECHOCARDIOGRAM LIMITED REPORT   Patient Name:   ASTI MACKLEY Date of Exam: 11/17/2020 Medical Rec #:  [id number]      Height:       63.0 in Accession #:    [id number]     Weight:       173.1 lb Date of Birth:  Feb 26, 1946      BSA:          1.818 m Patient Age:    31 years       BP:           159/66 mmHg Patient Gender: F              HR:           101 bpm. Exam Location:  Inpatient Procedure: Cardiac Doppler, Color Doppler, Limited Echo and Intracardiac            Opacification Agent Indications:    CHF-Acute Diastolic  History:        Patient has prior history of Echocardiogram examinations. Risk                 Factors:Former Smoker. CKD.  Sonographer:    Clayton Lefort RDCS (AE) Referring Phys: [id number] [place]  1. Left ventricular ejection fraction, by estimation, is 45 to 50%. The left ventricle has mildly decreased function. The left ventricle demonstrates regional wall motion abnormalities (see scoring diagram/findings for description). There is moderate left ventricular hypertrophy.  2. The mitral valve was not well visualized. No evidence of mitral valve regurgitation.  3. The aortic valve is tricuspid. Mild aortic valve sclerosis is present, with no evidence of aortic valve stenosis. Comparison(s): Prior images reviewed side by side. Changes from prior study are noted. FINDINGS  Left Ventricle: Left ventricular ejection fraction, by estimation, is 45 to 50%. The left ventricle has mildly decreased function. The left ventricle demonstrates regional wall motion abnormalities. Definity contrast agent was given IV to delineate the left ventricular  endocardial borders. There is moderate left ventricular hypertrophy.  LV Wall Scoring: The entire apex is akinetic. Mitral Valve: The mitral valve was not well visualized. Tricuspid Valve: The tricuspid valve is not well visualized. Tricuspid valve regurgitation is not demonstrated. Aortic Valve: The aortic valve is tricuspid. Mild aortic valve sclerosis is present, with no evidence of aortic valve stenosis. Aortic valve mean gradient measures 2.0 mmHg. Aortic valve peak gradient measures 4.8 mmHg. Aortic valve area, by VTI measures  2.01 cm. LEFT VENTRICLE PLAX 2D LVIDd:         4.10 cm  Diastology LVIDs:         2.50 cm  LV e' medial:    3.05 cm/s LV PW:         1.50 cm  LV E/e' medial:  17.1 LV IVS:        1.60 cm  LV e' lateral:   6.64 cm/s LVOT diam:     2.00 cm  LV E/e' lateral: 7.8 LV SV:         39 LV SV Index:   21 LVOT Area:     3.14 cm  IVC IVC diam: 1.50 cm LEFT ATRIUM         Index LA diam:    2.90 cm 1.59 cm/m  AORTIC VALVE AV Area (Vmax):  2.08 cm AV Area (Vmean):   2.22 cm AV Area (VTI):     2.01 cm AV Vmax:           109.00 cm/s AV Vmean:          62.200 cm/s AV VTI:            0.192 m AV Peak Grad:      4.8 mmHg AV Mean Grad:      2.0 mmHg LVOT Vmax:         72.10 cm/s LVOT Vmean:        44.000 cm/s LVOT VTI:          0.123 m LVOT/AV VTI ratio: 0.64  AORTA Ao Root diam: 2.90 cm Ao Asc diam:  3.10 cm MITRAL VALVE MV Area (PHT): 2.83 cm     SHUNTS MV Decel Time: 268 msec     Systemic VTI:  0.12 m MV E velocity: 52.10 cm/s   Systemic Diam: 2.00 cm MV A velocity: 114.00 cm/s MV E/A ratio:  0.46 Candee Furbish MD Electronically signed by Candee Furbish MD Signature Date/Time: 11/17/2020/5:06:53 PM    Final

## 2020-11-23 LAB — CBC WITH DIFFERENTIAL/PLATELET
Abs Immature Granulocytes: 0.07 10*3/uL (ref 0.00–0.07)
Basophils Absolute: 0 10*3/uL (ref 0.0–0.1)
Basophils Relative: 0 %
Eosinophils Absolute: 0.4 10*3/uL (ref 0.0–0.5)
Eosinophils Relative: 3 %
HCT: 24.8 % — ABNORMAL LOW (ref 36.0–46.0)
Hemoglobin: 7.8 g/dL — ABNORMAL LOW (ref 12.0–15.0)
Immature Granulocytes: 1 %
Lymphocytes Relative: 13 %
Lymphs Abs: 1.5 10*3/uL (ref 0.7–4.0)
MCH: 25.4 pg — ABNORMAL LOW (ref 26.0–34.0)
MCHC: 31.5 g/dL (ref 30.0–36.0)
MCV: 80.8 fL (ref 80.0–100.0)
Monocytes Absolute: 1.2 10*3/uL — ABNORMAL HIGH (ref 0.1–1.0)
Monocytes Relative: 11 %
Neutro Abs: 8.3 10*3/uL — ABNORMAL HIGH (ref 1.7–7.7)
Neutrophils Relative %: 72 %
Platelets: 267 10*3/uL (ref 150–400)
RBC: 3.07 MIL/uL — ABNORMAL LOW (ref 3.87–5.11)
RDW: 19.4 % — ABNORMAL HIGH (ref 11.5–15.5)
WBC: 11.5 10*3/uL — ABNORMAL HIGH (ref 4.0–10.5)
nRBC: 0 % (ref 0.0–0.2)

## 2020-11-23 LAB — COMPREHENSIVE METABOLIC PANEL
ALT: 20 U/L (ref 0–44)
AST: 26 U/L (ref 15–41)
Albumin: 1.6 g/dL — ABNORMAL LOW (ref 3.5–5.0)
Alkaline Phosphatase: 129 U/L — ABNORMAL HIGH (ref 38–126)
Anion gap: 7 (ref 5–15)
BUN: 13 mg/dL (ref 8–23)
CO2: 18 mmol/L — ABNORMAL LOW (ref 22–32)
Calcium: 7.9 mg/dL — ABNORMAL LOW (ref 8.9–10.3)
Chloride: 104 mmol/L (ref 98–111)
Creatinine, Ser: 1.66 mg/dL — ABNORMAL HIGH (ref 0.44–1.00)
GFR, Estimated: 32 mL/min — ABNORMAL LOW (ref >60)
Glucose, Bld: 96 mg/dL (ref 70–99)
Potassium: 4.3 mmol/L (ref 3.5–5.1)
Sodium: 129 mmol/L — ABNORMAL LOW (ref 135–145)
Total Bilirubin: 0.4 mg/dL (ref 0.3–1.2)
Total Protein: 4.2 g/dL — ABNORMAL LOW (ref 6.5–8.1)

## 2020-11-23 LAB — GLUCOSE, CAPILLARY
Glucose-Capillary: 81 mg/dL (ref 70–99)
Glucose-Capillary: 90 mg/dL (ref 70–99)

## 2020-11-23 LAB — C-REACTIVE PROTEIN: CRP: 5.6 mg/dL — ABNORMAL HIGH (ref <1.0)

## 2020-11-23 LAB — MAGNESIUM: Magnesium: 1.6 mg/dL — ABNORMAL LOW (ref 1.7–2.4)

## 2020-11-23 LAB — BRAIN NATRIURETIC PEPTIDE: B Natriuretic Peptide: 364.9 pg/mL — ABNORMAL HIGH (ref 0.0–100.0)

## 2020-11-23 MED ORDER — FERROUS SULFATE 220 (44 FE) MG/5ML PO ELIX: 220.0000 mg | ORAL_SOLUTION | Freq: Two times a day (BID) | ORAL | 3 refills | Status: DC

## 2020-11-23 MED ORDER — MAGNESIUM SULFATE 2 GM/50ML IV SOLN
2.0000 g | Freq: Once | INTRAVENOUS | Status: AC
  Administered 2020-11-23: 2 g via INTRAVENOUS
  Filled 2020-11-23: qty 50

## 2020-11-23 MED ORDER — SODIUM BICARBONATE 650 MG PO TABS: 650.0000 mg | ORAL_TABLET | Freq: Two times a day (BID) | ORAL | 1 refills | Status: AC

## 2020-11-23 MED ORDER — CYCLOBENZAPRINE HCL 10 MG PO TABS: 5.0000 mg | ORAL_TABLET | Freq: Three times a day (TID) | ORAL | 0 refills | Status: DC | PRN

## 2020-11-23 MED ORDER — METOPROLOL TARTRATE 100 MG PO TABS: 100.0000 mg | ORAL_TABLET | Freq: Two times a day (BID) | ORAL | 0 refills | Status: DC

## 2020-11-23 MED ORDER — FERROUS SULFATE 220 (44 FE) MG/5ML PO ELIX
220.0000 mg | ORAL_SOLUTION | Freq: Two times a day (BID) | ORAL | Status: DC
  Administered 2020-11-23: 220 mg via ORAL
  Filled 2020-11-23 (×2): qty 5

## 2020-11-23 NOTE — Discharge Instructions (Signed)
Person Under Monitoring Name: [name]  Location: [address] [place] [address]   Infection Prevention Recommendations for Individuals Confirmed to have, or Being Evaluated for, 2019 Novel Coronavirus (COVID-19) Infection Who Receive Care at Home  Individuals who are confirmed to have, or are being evaluated for, COVID-19 should follow the prevention steps below until a healthcare provider or local or state health department says they can return to normal activities.  Stay home except to get medical care You should restrict activities outside your home, except for getting medical care. Do not go to work, school, or public areas, and do not use public transportation or taxis.  Call ahead before visiting your doctor Before your medical appointment, call the healthcare provider and tell them that you have, or are being evaluated for, COVID-19 infection. This will help the healthcare providers office take steps to keep other people from getting infected. Ask your healthcare provider to call the local or state health department.  Monitor your symptoms Seek prompt medical attention if your illness is worsening (e.g., difficulty breathing). Before going to your medical appointment, call the healthcare provider and tell them that you have, or are being evaluated for, COVID-19 infection. Ask your healthcare provider to call the local or state health department.  Wear a facemask You should wear a facemask that covers your nose and mouth when you are in the same room with other people and when you visit a healthcare provider. People who live with or visit you should also wear a facemask while they are in the same room with you.  Separate yourself from other people in your home As much as possible, you should stay in a different room from other people in your home. Also, you should use a separate bathroom, if available.  Avoid sharing household items You should not  share dishes, drinking glasses, cups, eating utensils, towels, bedding, or other items with other people in your home. After using these items, you should wash them thoroughly with soap and water.  Cover your coughs and sneezes Cover your mouth and nose with a tissue when you cough or sneeze, or you can cough or sneeze into your sleeve. Throw used tissues in a lined trash can, and immediately wash your hands with soap and water for at least 20 seconds or use an alcohol-based hand rub.  Wash your [facility] your hands often and thoroughly with soap and water for at least 20 seconds. You can use an alcohol-based hand sanitizer if soap and water are not available and if your hands are not visibly dirty. Avoid touching your eyes, nose, and mouth with unwashed hands.   Prevention Steps for Caregivers and Household Members of Individuals Confirmed to have, or Being Evaluated for, COVID-19 Infection Being Cared for in the Home  If you live with, or provide care at home for, a person confirmed to have, or being evaluated for, COVID-19 infection please follow these guidelines to prevent infection:  Follow healthcare providers instructions Make sure that you understand and can help the patient follow any healthcare provider instructions for all care.  Provide for the patients basic needs You should help the patient with basic needs in the home and provide support for getting groceries, prescriptions, and other personal needs.  Monitor the patients symptoms If they are getting sicker, call his or her medical provider and tell them that the patient has, or is being evaluated for, COVID-19 infection. This will help the healthcare  providers office take steps to keep other people from getting infected. Ask the healthcare provider to call the local or state health department.  Limit the number of people who have contact with the patient  If possible, have only one caregiver for the  patient.  Other household members should stay in another home or place of residence. If this is not possible, they should stay  in another room, or be separated from the patient as much as possible. Use a separate bathroom, if available.  Restrict visitors who do not have an essential need to be in the home.  Keep older adults, very young children, and other sick people away from the patient Keep older adults, very young children, and those who have compromised immune systems or chronic health conditions away from the patient. This includes people with chronic heart, lung, or kidney conditions, diabetes, and cancer.  Ensure good ventilation Make sure that shared spaces in the home have good air flow, such as from an air conditioner or an opened window, weather permitting.  Wash your hands often  Wash your hands often and thoroughly with soap and water for at least 20 seconds. You can use an alcohol based hand sanitizer if soap and water are not available and if your hands are not visibly dirty.  Avoid touching your eyes, nose, and mouth with unwashed hands.  Use disposable paper towels to dry your hands. If not available, use dedicated cloth towels and replace them when they become wet.  Wear a facemask and gloves  Wear a disposable facemask at all times in the room and gloves when you touch or have contact with the patients blood, body fluids, and/or secretions or excretions, such as sweat, saliva, sputum, nasal mucus, vomit, urine, or feces.  Ensure the mask fits over your nose and mouth tightly, and do not touch it during use.  Throw out disposable facemasks and gloves after using them. Do not reuse.  Wash your hands immediately after removing your facemask and gloves.  If your personal clothing becomes contaminated, carefully remove clothing and launder. Wash your hands after handling contaminated clothing.  Place all used disposable facemasks, gloves, and other waste in a lined  container before disposing them with other household waste.  Remove gloves and wash your hands immediately after handling these items.  Do not share dishes, glasses, or other household items with the patient  Avoid sharing household items. You should not share dishes, drinking glasses, cups, eating utensils, towels, bedding, or other items with a patient who is confirmed to have, or being evaluated for, COVID-19 infection.  After the person uses these items, you should wash them thoroughly with soap and water.  Wash laundry thoroughly  Immediately remove and wash clothes or bedding that have blood, body fluids, and/or secretions or excretions, such as sweat, saliva, sputum, nasal mucus, vomit, urine, or feces, on them.  Wear gloves when handling laundry from the patient.  Read and follow directions on labels of laundry or clothing items and detergent. In general, wash and dry with the warmest temperatures recommended on the label.  Clean all areas the individual has used often  Clean all touchable surfaces, such as counters, tabletops, doorknobs, bathroom fixtures, toilets, phones, keyboards, tablets, and bedside tables, every day. Also, clean any surfaces that may have blood, body fluids, and/or secretions or excretions on them.  Wear gloves when cleaning surfaces the patient has come in contact with.  Use a diluted bleach solution (e.g., dilute bleach with  1 part bleach and 10 parts water) or a household disinfectant with a label that says EPA-registered for coronaviruses. To make a bleach solution at home, add 1 tablespoon of bleach to 1 quart (4 cups) of water. For a larger supply, add  cup of bleach to 1 gallon (16 cups) of water.  Read labels of cleaning products and follow recommendations provided on product labels. Labels contain instructions for safe and effective use of the cleaning product including precautions you should take when applying the product, such as wearing gloves or  eye protection and making sure you have good ventilation during use of the product.  Remove gloves and wash hands immediately after cleaning.  Monitor yourself for signs and symptoms of illness Caregivers and household members are considered close contacts, should monitor their health, and will be asked to limit movement outside of the home to the extent possible. Follow the monitoring steps for close contacts listed on the symptom monitoring form.   ? If you have additional questions, contact your local health department or call the epidemiologist on call at [phone] (available 24/7). ? This guidance is subject to change. For the most up-to-date guidance from [facility], please refer to their website: [email or web address]

## 2020-11-23 NOTE — TOC Transition Note (Signed)
Transition of Care [facility]) - CM/SW Discharge Note   Patient Details  Name: [name] MRN: [id number] Date of Birth: [birth year]  [facility] CM/SW Contact:  Bartholomew Crews, RN Phone Number: [phone] 11/23/2020, 11:22 AM   Clinical Narrative:     Patient to transition home today. HH orders written per MD. Amy at [facility] notified. No further TOC needs identified at this time.  Final next level of care: [place] Barriers to Discharge: No Barriers Identified   Patient Goals and CMS Choice Patient states their goals for this hospitalization and ongoing recovery are:: return home CMS Medicare.gov Compare Post Acute Care list provided to:: Patient Choice offered to / list presented to : Patient  Discharge Placement                       Discharge Plan and Services In-house Referral: NA Discharge Planning Services: CM Consult Post Acute Care Choice: Home Health          DME Arranged: N/A DME Agency: NA       HH Arranged: RN,PT,OT [place] Agency: [facility] Date [place]: 11/23/20 Time HH Agency Contacted: 1000 Representative spoke with at [place]: Amy  Social Determinants of Health ([place]) Interventions     Readmission Risk Interventions No flowsheet data found.

## 2020-11-23 NOTE — Care Management Important Message (Signed)
Important Message  Patient Details  Name: [name] MRN: [id number] Date of Birth: Jul 14, 1946   Medicare Important Message Given:  Yes - Important Message mailed due to current National Emergency   Verbal consent obtained due to current National Emergency  Relationship to patient: Self Contact Name: Micalah Cabezas Call Date: 11/23/20  Time: 1152 Phone: [phone] Outcome: No Answer/Busy Important Message mailed to: Patient address on file    Delorse Lek 11/23/2020, 11:53 AM

## 2020-11-23 NOTE — Discharge Summary (Addendum)
[name], is a 75 y.o. female  DOB [birth year]  MRN [id number].  Admission date:  11/15/2020  Admitting Physician  Etta Quill, DO  Discharge Date:  11/23/2020   Primary MD  Leanna Battles, MD  Recommendations for primary care physician for things to follow:  - please check CBC,CMP during next visit. -Please avoid narcotics or opiates   Admission Diagnosis  AKI (acute kidney injury) ([place]) [N17.9] PNA (pneumonia) [J18.9] Acute-on-chronic kidney injury ([place]) [N17.9, N18.9] Nausea and vomiting, intractability of vomiting not specified, unspecified vomiting type [R11.2]   Discharge Diagnosis  AKI (acute kidney injury) ([place]) [N17.9] PNA (pneumonia) [J18.9] Acute-on-chronic kidney injury ([place]) [N17.9, N18.9] Nausea and vomiting, intractability of vomiting not specified, unspecified vomiting type [R11.2]    Principal Problem:   Metabolic acidosis, NAG, failure of bicarbonate regeneration Active Problems:   Type 2 diabetes mellitus with diabetic peripheral angiopathy without gangrene ([place])   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  Ileostomy 10/18/2017   Acute kidney injury superimposed on CKD (HCC)   Hyperkalemia   Hyponatremia   Nausea & vomiting   Acute-on-chronic kidney injury [facility])      Past Medical History:  Diagnosis Date  . Allergy   . Anemia    past hx per pt   . Anxiety   . Arthritis    back   . Arthropathy, unspecified, site unspecified   . Blind loop syndrome   . Carotid artery disease ([place])    documented occlusion of left ICA  . Cataract    removed both eyes   . Clotting disorder (HCC)    Hx Clot in leg per pt   . Colostomy present ([place])   . Coronary artery disease    a. known 60-70% mid-LAD stenosis with caths in 1999, 2002, 2006, and 2012 showing stable anatomy b. low-risk NST in 10/2015  . Depression   . Diabetes insipidus ([place])   . Diabetes  mellitus   . Diverticulosis   . Dizziness    chronic  . Dyslipidemia   . Dyspnea    with exertion  . Fatty liver   . GERD (gastroesophageal reflux disease)    hiatial hernia  . Heart murmur   . Hepatomegaly   . Hiatal hernia   . HTN (hypertension)   . Hyperlipidemia   . Hypertension   . Irritable bowel syndrome   . Melanosis coli   . Metatarsal fracture right  . Normal nuclear stress test    nml 01/03/10;06/19/07  . Other, mixed, or unspecified nondependent drug abuse, unspecified   . Pancreatitis, chronic ([place])   . Peripheral vascular disease ([place]) 02/20/10   s/p stent of left SFA  . Seizure ([place]) 11/04/2018   last 1 yr 2020 per pt unsure month   . Sleep apnea    no cpap   . Stroke ([place])   . Thyroid nodule   . Unspecified essential hypertension   . Vitamin B12 deficiency     Past Surgical History:  Procedure Laterality Date  .  ABDOMINAL HYSTERECTOMY     partial  . AUGMENTATION MAMMAPLASTY     bilateral 1976 retro   . BREAST ENHANCEMENT SURGERY    . CARDIAC CATHETERIZATION  07/07/98;08/31/01;03/04/05   60 -70% LAD  . CATARACT EXTRACTION    . COLON RESECTION N/A 10/14/2017   Procedure: EXPLORATORY LAPAROTOMY, EXTENDED RIGHT COLECTOMY; APPLICATION OF ABDOMINAL VACUUM DRESSING;  Surgeon: Jovita Kussmaul, MD;  Location: [facility];  Service: General;  Laterality: N/A;  . COLONOSCOPY    . ENDOBRONCHIAL ULTRASOUND Bilateral 02/24/2017   Procedure: ENDOBRONCHIAL ULTRASOUND;  Surgeon: Collene Gobble, MD;  Location: [facility];  Service: Cardiopulmonary;  Laterality: Bilateral;  . EYE SURGERY Left Nov. 2016   Cataract  . EYE SURGERY Right 2001   Cataract  . FEMORAL ARTERY STENT     Left leg  . LAPAROTOMY N/A 10/16/2017   Procedure: EXPLORATORY LAPAROTOMY, PARTIAL OMENTECTOMY, RESECTION ISCHEMIC ILEUM 384YK, APPLICATION OF VAC ABDOMINAL DRESSING;  Surgeon: Armandina Gemma, MD;  Location: [facility];  Service: General;  Laterality: N/A;  . LAPAROTOMY N/A 10/18/2017   Procedure:  RE-EXPLORATION OF ABDOMEN, ILEOSTOMY CREATION;  Surgeon: Stark Klein, MD;  Location: [facility];  Service: General;  Laterality: N/A;  . LUNG SURGERY  03/2017   Benign polyps removed  . PATELLA REALIGNMENT Left   . POLYPECTOMY    . SIGMOIDOSCOPY    . TONSILLECTOMY    . TOTAL ABDOMINAL HYSTERECTOMY    . WRIST SURGERY     right       History of present illness and  Hospital Course:     Kindly see H&P for history of present illness and admission details, please review complete Labs, Consult reports and Test reports for all details in brief  HPI  from the history and physical done on the day of admission 11/15/2020   HPI: [name] is a 75 y.o. female with medical history significant of HTN, colostomy, seizure disorder, CKD 3.  Pt presents to ED with 10 day h/o intermittent N/V.  Worse over past few days.  Unable to keep liquids or PO meds down.  Normal ostomy output however.  Some abd discomfort but no severe pain.  Symptoms severe, worsening, persistent.  No CP, no SOB.   ED Course: Sodium 124, K 6.0, Creat 3.2 up from a 1.5 baseline, bicarb 13 but no anion gap.  2L NS bolus ordered.  CT abd/pelvis: no obstruction, has parastomal hernia.   Hospital Course    Abd Pain, acute on chronic diarrhea, has history of high output ileostomy.  -she was  Hydrated with IV fluids, on scheduled Creon, Lomotil and as needed Imodium, 2 doses of Tincher of opium.  CT abdomen pelvis nonacute ,exam benign.  Abdominal pain completely resolved on 11/20/2020 but lipase is elevated, abdominal exam is persistently benign since, this could be nonspecific of chronic pancreatitis, she is on pancreatic enzymes on a chronic basis but denies any knowledge of chronic pancreatitis. -Ileostomy output has significantly subsided, it is 1750 cc over last 24 hours, and she tolerated most of the day with no IV fluids, her appetite and oral intake has improved . -She was encouraged to continue with her  Lomotil, and to take Imodium more frequently as needed, and to increase fluid intake .  Diarrhea induced severe dehydration, hyponatremia, AKI on CKD stage IIIb, hyperkalemia, non-anion gap metabolic acidosis.   -Management of diarrhea as above, AKI, hyperkalemia have resolved and acidosis is improving, underlying CKD 3B with baseline creatinine around 1.5.  Her creatinine is 1.6 on discharge -hyponatremia improving -She will be discharged on sodium bicarb  Acute Covid 19 infection   - CXR stable, no SOB, monitor, fully Vaccinated- boosted.   History of bowel ischemia requiring small bowel resection and how output ileostomy.   -Supportive care, ostomy nurses following.  History of seizures.   -Continue with Keppra  Sinus tachycardia and hypertension.   -Beta-blockers  -Losartan has been stopped due to AKI, started on Norvasc instead .   History of CAD.   -On beta-blocker, dual antiplatelet therapy and Lipitor, no acute issues.  Hypomagnesemia. -  Replaced  Anemia of iron deficiency -Iron low at 13, ferritin low at 27, V95 and folic acid within normal limit, she received IV iron transfusion during hospital stay, this is most likely in the setting of malabsorption due to diarrhea, oral iron has been prescribed on discharge.  HTN  - on Lopressor, and norvasc  Nonspecific elevation of lipase. -  No abdominal pain, CT scan does not show any acute or chronic changes, interestingly she is on chronic Creon supplement but does not know of any history of pancreatic issues, since CT scan is stable and there is no pain .  Rising D-dimer -Most likely due to inflammation from Covid, negative leg ultrasound, no clinical suspicion for PE as she is on room air and no chest pain.  Increased deconditioning and somnolence at home.  Per daughter this all started since she started taking Abilify, Abilify will be stopped and Flexeril dose will be reduced  On discharge.   Discharge  Condition:  Stable   Follow UP   Follow-up Information    [facility].   Specialty: [place] Why: the office will call to schedule home health physical therapy visits Contact information: [place]  [phone]                 Discharge Instructions  and  Discharge Medications     Discharge Instructions    Diet - low sodium heart healthy   Complete by: As directed    Discharge instructions   Complete by: As directed    Follow with Primary MD Leanna Battles, MD in 7 days   Get CBC, CMP,  checked  by Primary MD next visit.    Activity: As tolerated with Full fall precautions use walker/cane & assistance as needed   Disposition Home    Diet: Heart Healthy    On your next visit with your primary care physician please Get Medicines reviewed and adjusted.   Please request your Prim.MD to go over all Hospital Tests and Procedure/Radiological results at the follow up, please get all Hospital records sent to your Prim MD by signing hospital release before you go home.   If you experience worsening of your admission symptoms, develop shortness of breath, life threatening emergency, suicidal or homicidal thoughts you must seek medical attention immediately by calling 911 or calling your MD immediately  if symptoms less severe.  You Must read complete instructions/literature along with all the possible adverse reactions/side effects for all the Medicines you take and that have been prescribed to you. Take any new Medicines after you have completely understood and accpet all the possible adverse reactions/side effects.   Do not drive, operating heavy machinery, perform activities at heights, swimming or participation in water activities or provide baby sitting services if your were admitted for syncope or siezures until you have seen by Primary  MD or a Neurologist and advised to do so again.  Do not drive  when taking Pain medications.    Do not take more than prescribed Pain, Sleep and Anxiety Medications  Special Instructions: If you have smoked or chewed Tobacco  in the last 2 yrs please stop smoking, stop any regular Alcohol  and or any Recreational drug use.  Wear Seat belts while driving.   Please note  You were cared for by a hospitalist during your hospital stay. If you have any questions about your discharge medications or the care you received while you were in the hospital after you are discharged, you can call the unit and asked to speak with the hospitalist on call if the hospitalist that took care of you is not available. Once you are discharged, your primary care physician will handle any further medical issues. Please note that NO REFILLS for any discharge medications will be authorized once you are discharged, as it is imperative that you return to your primary care physician (or establish a relationship with a primary care physician if you do not have one) for your aftercare needs so that they can reassess your need for medications and monitor your lab values.   Increase activity slowly   Complete by: As directed      Allergies as of 11/23/2020      Reactions   Adhesive [tape] Other (See Comments)   "Took off my skin"   Codeine Other (See Comments)   Altered mental state    Iodinated Diagnostic Agents Rash      Medication List    STOP taking these medications   ARIPiprazole 5 MG tablet Commonly known as: ABILIFY   losartan 100 MG tablet Commonly known as: COZAAR   metoprolol succinate 50 MG 24 hr tablet Commonly known as: TOPROL-XL   Poly-Iron 150 150 MG capsule Generic drug: iron polysaccharides     TAKE these medications   acetaminophen 325 MG tablet Commonly known as: TYLENOL Take 650 mg by mouth every 6 (six) hours as needed for headache (pain).   amLODipine 10 MG tablet Commonly known as: NORVASC Take 10 mg by mouth daily.   atorvastatin 20 MG  tablet Commonly known as: LIPITOR Take 20 mg by mouth daily.   clopidogrel 75 MG tablet Commonly known as: PLAVIX Take 75 mg by mouth daily.   cyclobenzaprine 10 MG tablet Commonly known as: FLEXERIL Take 0.5 tablets (5 mg total) by mouth 3 (three) times daily as needed for muscle spasms. What changed: how much to take   diphenoxylate-atropine 2.5-0.025 MG tablet Commonly known as: LOMOTIL Take 1 tablet by mouth 3 (three) times daily.   ferrous sulfate 220 (44 Fe) MG/5ML solution Take 5 mLs (220 mg total) by mouth 2 (two) times daily with a meal.   levETIRAcetam 500 MG tablet Commonly known as: KEPPRA TAKE 1 TABLET BY MOUTH TWICE A DAY   loperamide 2 MG capsule Commonly known as: IMODIUM Take 2 mg by mouth daily as needed for diarrhea or loose stools.   magnesium oxide 400 MG tablet Commonly known as: MAG-OX Take 1 tablet by mouth daily.   metoprolol tartrate 100 MG tablet Commonly known as: LOPRESSOR Take 1 tablet (100 mg total) by mouth 2 (two) times daily.   montelukast 10 MG tablet Commonly known as: SINGULAIR Take 10 mg by mouth daily.   nitroGLYCERIN 0.4 MG SL tablet Commonly known as: NITROSTAT Place 1 tablet (0.4 mg total) under the tongue every 5 (five)  minutes as needed for chest pain.   pantoprazole 40 MG tablet Commonly known as: PROTONIX Take 1 tablet (40 mg total) by mouth 2 (two) times daily.   sodium bicarbonate 650 MG tablet Take 1 tablet (650 mg total) by mouth 2 (two) times daily.   Sodium Fluoride 5000 PPM 1.1 % Pste Generic drug: Sodium Fluoride SMARTSIG:Sparingly By Mouth Twice Daily   traMADol 50 MG tablet Commonly known as: ULTRAM Take 50 mg by mouth 3 (three) times daily as needed for moderate pain.   venlafaxine XR 75 MG 24 hr capsule Commonly known as: EFFEXOR-XR Take 1 capsule (75 mg total) by mouth daily.   vitamin B-12 500 MCG tablet Commonly known as: CYANOCOBALAMIN Take 500 mcg by mouth daily.   Vitamin D  (Ergocalciferol) 1.25 MG (50000 UNIT) Caps capsule Commonly known as: DRISDOL Take 50,000 Units by mouth every Wednesday.   Zenpep 40000-126000 units Cpep Generic drug: Pancrelipase (Lip-Prot-Amyl) Take 2 capsules by mouth 3 (three) times daily with meals. Take 2 capsules three times a day with meals for one week and assess What changed:   how much to take  when to take this  additional instructions            Durable Medical Equipment  (From admission, onward)         Start     Ordered   11/20/20 1035  For home use only DME Walker rolling  Once       Comments: 5 wheel  Question Answer Comment  Walker: With [place]   Patient needs a walker to treat with the following condition Weakness      11/20/20 1034   11/20/20 1035  For home use only DME 3 n 1  Once        11/20/20 1034            Diet and Activity recommendation: See Discharge Instructions above   Consults obtained - none   Major procedures and Radiology Reports - PLEASE review detailed and final reports for all details, in brief -      CT ABDOMEN PELVIS WO CONTRAST  Result Date: 11/16/2020 CLINICAL DATA:  Abdominal pain nausea EXAM: CT ABDOMEN AND PELVIS WITHOUT CONTRAST TECHNIQUE: Multidetector CT imaging of the abdomen and pelvis was performed following the standard protocol without IV contrast. COMPARISON:  August 23rd 2021 FINDINGS: Lower chest: The visualized heart size within normal limits. No pericardial fluid/thickening. There is a small hiatal hernia present. The visualized portions of the lungs are clear. Hepatobiliary: Although limited due to the lack of intravenous contrast, normal in appearance without gross focal abnormality. No evidence of calcified gallstones or biliary ductal dilatation. Pancreas:  Unremarkable.  No surrounding inflammatory changes. Spleen: Normal in size. Although limited due to the lack of intravenous contrast, normal in appearance. Adrenals/Urinary Tract: Both  adrenal glands appear normal. There is a punctate calcifications seen within the upper pole of the left kidney. No right-sided renal or collecting system calculi are seen. Bladder is unremarkable. Stomach/Bowel: A small hiatal hernia is present, however otherwise unremarkable stomach is seen. A right anterior abdominal wall ileostomy is noted. There is been a prior right hemicolectomy wound seen. No dilated loops of bowel are noted. Vascular/Lymphatic: There are no enlarged abdominal or pelvic lymph nodes. Scattered aortic atherosclerotic calcifications are seen without aneurysmal dilatation. Reproductive: The patient is status post hysterectomy. No adnexal masses or collections seen. Other: There are 2 fat containing the upper anterior abdominal wall hernia is  present there is also a stable left anterior abdominal wall parastomal hernia containing loops small bowel. Musculoskeletal: No acute or significant osseous findings. Degenerative changes are seen throughout the lumbar spine. 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 No definite evidence of obstruction. Punctate nonobstructing left renal calculi Aortic Atherosclerosis (ICD10-I70.0). Electronically Signed   By: Prudencio Pair M.D.   On: 11/16/2020 00:59   US RENAL  Result Date: 11/16/2020 CLINICAL DATA:  Acute kidney injury EXAM: RENAL / URINARY TRACT ULTRASOUND COMPLETE COMPARISON:  CT 11/16/2020, ultrasound 09/12/2016 FINDINGS: Right Kidney: Renal measurements: 9.1 x 4.0 x 4.0 cm = volume: 75 mL. Echogenicity is within normal limits. No concerning renal mass, shadowing calculus or hydronephrosis. Left Kidney: Renal measurements: 9.2 x 5.2 x 3.5 cm = volume: 86 mL. Echogenicity is within normal limits. No concerning renal mass, visible shadowing calculus or hydronephrosis. Few punctate calcifications seen in the upper pole left kidney on CT imaging are not well visualized on this sonographic exam. Bladder: Appears  normal for degree of bladder distention. Other: None. IMPRESSION: Unremarkable renal ultrasound. Previously seen punctate upper pole left renal calculi are not well visualized. Electronically Signed   By: Lovena Le M.D.   On: 11/16/2020 03:23   DG Chest Port 1 View  Result Date: 11/20/2020 CLINICAL DATA:  Shortness of breath, COVID positive EXAM: PORTABLE CHEST 1 VIEW COMPARISON:  11/17/2020 FINDINGS: Bilateral lower lobe opacities, likely reflecting a combination of atelectasis/pneumonia with small pleural effusions. No pneumothorax. The heart is in size.  Thoracic aortic atherosclerosis. IMPRESSION: Bilateral lower lobe opacities, likely reflecting a combination of atelectasis/pneumonia with small pleural effusions. Electronically Signed   By: Julian Hy M.D.   On: 11/20/2020 08:44   DG CHEST PORT 1 VIEW  Result Date: 11/17/2020 CLINICAL DATA:  COVID-19 positivity EXAM: PORTABLE CHEST 1 VIEW COMPARISON:  10/21/2019 FINDINGS: Cardiac shadow is within normal limits. Aortic calcifications are seen. The lungs are well aerated bilaterally. No focal infiltrate is seen. No bony abnormality is noted. IMPRESSION: No acute abnormality noted. Electronically Signed   By: Inez Catalina M.D.   On: 11/17/2020 01:59   VAS [place] LOWER EXTREMITY VENOUS (DVT)  Result Date: 11/19/2020  Lower Venous DVT Study Indications: Covid, elevated d-dimer.  Comparison Study: No prior studies. Performing Technologist: Darlin Coco RDMS  Examination Guidelines: A complete evaluation includes B-mode imaging, spectral Doppler, color Doppler, and power Doppler as needed of all accessible portions of each vessel. Bilateral testing is considered an integral part of a complete examination. Limited examinations for reoccurring indications may be performed as noted. The reflux portion of the exam is performed with the patient in reverse Trendelenburg.  +---------+---------------+---------+-----------+----------+--------------+ RIGHT     CompressibilityPhasicitySpontaneityPropertiesThrombus Aging +---------+---------------+---------+-----------+----------+--------------+ CFV      Full           Yes      Yes                                 +---------+---------------+---------+-----------+----------+--------------+ SFJ      Full                                                        +---------+---------------+---------+-----------+----------+--------------+ FV Prox  Full                                                        +---------+---------------+---------+-----------+----------+--------------+  FV Mid   Full                                                        +---------+---------------+---------+-----------+----------+--------------+ FV DistalFull                                                        +---------+---------------+---------+-----------+----------+--------------+ PFV      Full                                                        +---------+---------------+---------+-----------+----------+--------------+ POP      Full           Yes      Yes                                 +---------+---------------+---------+-----------+----------+--------------+ PTV      Full                                                        +---------+---------------+---------+-----------+----------+--------------+ PERO     Full                                                        +---------+---------------+---------+-----------+----------+--------------+   +---------+---------------+---------+-----------+----------+--------------+ LEFT     CompressibilityPhasicitySpontaneityPropertiesThrombus Aging +---------+---------------+---------+-----------+----------+--------------+ CFV      Full           Yes      Yes                                 +---------+---------------+---------+-----------+----------+--------------+ SFJ      Full                                                         +---------+---------------+---------+-----------+----------+--------------+ FV Prox  Full                                                        +---------+---------------+---------+-----------+----------+--------------+ FV Mid   Full                                                        +---------+---------------+---------+-----------+----------+--------------+  FV DistalFull                                                        +---------+---------------+---------+-----------+----------+--------------+ PFV      Full                                                        +---------+---------------+---------+-----------+----------+--------------+ POP      Full           Yes      Yes                                 +---------+---------------+---------+-----------+----------+--------------+ PTV      Full                                                        +---------+---------------+---------+-----------+----------+--------------+ PERO     Full                                                        +---------+---------------+---------+-----------+----------+--------------+     Summary: RIGHT: - There is no evidence of deep vein thrombosis in the lower extremity.  - No cystic structure found in the popliteal fossa.  LEFT: - There is no evidence of deep vein thrombosis in the lower extremity.  - No cystic structure found in the popliteal fossa.  *See table(s) above for measurements and observations. Electronically signed by Monica Martinez MD on 11/19/2020 at 7:54:32 PM.    Final    ECHOCARDIOGRAM LIMITED  Result Date: 11/17/2020    ECHOCARDIOGRAM LIMITED REPORT   Patient Name:   TANISHI NAULT Date of Exam: 11/17/2020 Medical Rec #:  [id number]      Height:       63.0 in Accession #:    [id number]     Weight:       173.1 lb Date of Birth:  11/19/1945      BSA:          1.818 m Patient Age:    54 years       BP:           159/66 mmHg Patient Gender: F               HR:           101 bpm. Exam Location:  Inpatient Procedure: Cardiac Doppler, Color Doppler, Limited Echo and Intracardiac            Opacification Agent Indications:    CHF-Acute Diastolic  History:        Patient has prior history of Echocardiogram examinations. Risk                 Factors:Former Smoker. CKD.  Sonographer:    Clayton Lefort RDCS (  AE) Referring Phys: [id number]  1. Left ventricular ejection fraction, by estimation, is 45 to 50%. The left ventricle has mildly decreased function. The left ventricle demonstrates regional wall motion abnormalities (see scoring diagram/findings for description). There is moderate left ventricular hypertrophy.  2. The mitral valve was not well visualized. No evidence of mitral valve regurgitation.  3. The aortic valve is tricuspid. Mild aortic valve sclerosis is present, with no evidence of aortic valve stenosis. Comparison(s): Prior images reviewed side by side. Changes from prior study are noted. FINDINGS  Left Ventricle: Left ventricular ejection fraction, by estimation, is 45 to 50%. The left ventricle has mildly decreased function. The left ventricle demonstrates regional wall motion abnormalities. Definity contrast agent was given IV to delineate the left ventricular endocardial borders. There is moderate left ventricular hypertrophy.  LV Wall Scoring: The entire apex is akinetic. Mitral Valve: The mitral valve was not well visualized. Tricuspid Valve: The tricuspid valve is not well visualized. Tricuspid valve regurgitation is not demonstrated. Aortic Valve: The aortic valve is tricuspid. Mild aortic valve sclerosis is present, with no evidence of aortic valve stenosis. Aortic valve mean gradient measures 2.0 mmHg. Aortic valve peak gradient measures 4.8 mmHg. Aortic valve area, by VTI measures  2.01 cm. LEFT VENTRICLE PLAX 2D LVIDd:         4.10 cm  Diastology LVIDs:         2.50 cm  LV e' medial:    3.05 cm/s LV PW:         1.50  cm  LV E/e' medial:  17.1 LV IVS:        1.60 cm  LV e' lateral:   6.64 cm/s LVOT diam:     2.00 cm  LV E/e' lateral: 7.8 LV SV:         39 LV SV Index:   21 LVOT Area:     3.14 cm  IVC IVC diam: 1.50 cm LEFT ATRIUM         Index LA diam:    2.90 cm 1.59 cm/m  AORTIC VALVE AV Area (Vmax):    2.08 cm AV Area (Vmean):   2.22 cm AV Area (VTI):     2.01 cm AV Vmax:           109.00 cm/s AV Vmean:          62.200 cm/s AV VTI:            0.192 m AV Peak Grad:      4.8 mmHg AV Mean Grad:      2.0 mmHg LVOT Vmax:         72.10 cm/s LVOT Vmean:        44.000 cm/s LVOT VTI:          0.123 m LVOT/AV VTI ratio: 0.64  AORTA Ao Root diam: 2.90 cm Ao Asc diam:  3.10 cm MITRAL VALVE MV Area (PHT): 2.83 cm     SHUNTS MV Decel Time: 268 msec     Systemic VTI:  0.12 m MV E velocity: 52.10 cm/s   Systemic Diam: 2.00 cm MV A velocity: 114.00 cm/s MV E/A ratio:  0.46 Candee Furbish MD Electronically signed by Candee Furbish MD Signature Date/Time: 11/17/2020/5:06:53 PM    Final     Micro Results     Recent Results (from the past 240 hour(s))  SARS CORONAVIRUS 2 (TAT 6-24 HRS) Nasopharyngeal     Status: Abnormal   Collection Time: 11/16/20  9:36 AM   Specimen: Nasopharyngeal  Result Value Ref Range Status   SARS Coronavirus 2 POSITIVE (A) NEGATIVE Final    Comment: (NOTE) SARS-CoV-2 target nucleic acids are DETECTED.  The SARS-CoV-2 RNA is generally detectable in upper and lower respiratory specimens during the acute phase of infection. Positive results are indicative of the presence of SARS-CoV-2 RNA. Clinical correlation with patient history and other diagnostic information is  necessary to determine patient infection status. Positive results do not rule out bacterial infection or co-infection with other viruses.  The expected result is Negative.  Fact Sheet for Patients: SugarRoll.be  Fact Sheet for Healthcare Providers: https://www.woods-mathews.com/  This test is not  yet approved or cleared by the [place] FDA and  has been authorized for detection and/or diagnosis of SARS-CoV-2 by FDA under an Emergency Use Authorization (EUA). This EUA will remain  in effect (meaning this test can be used) for the duration of the COVID-19 declaration under Section 564(b)(1) of the Act, 21 U. S.C. section 360bbb-3(b)(1), unless the authorization is terminated or revoked sooner.   Performed at [facility], [place] [address] [place], [place] [address]        Today   Subjective:   Danniella Robben today has no headache,no chest or abdominal pain, she denies any nausea or vomiting as well, no new weakness tingling or numbness, feels much better wants to go home today.   Objective:   Blood pressure (!) 149/50, pulse 72, temperature 98.2 F (36.8 C), resp. rate 18, height 5\' 3"  (1.6 m), weight 78.4 kg, SpO2 97 %.   Intake/Output Summary (Last 24 hours) at 11/23/2020 1117 Last data filed at 11/23/2020 1111 Gross per 24 hour  Intake 2632.01 ml  Output 1800 ml  Net 832.01 ml    Exam Awake Alert, Oriented x 3, No new F.N deficits, Normal affect Symmetrical Chest wall movement, Good air movement bilaterally, CTAB RRR,No Gallops,Rubs or new Murmurs, No Parasternal Heave +ve B.Sounds, Abd Soft, Non tender, ileostomy with semisolid output in bag, No rebound -guarding or rigidity. No Cyanosis, Clubbing or edema, No new Rash or bruise  Data Review   CBC w Diff:  Lab Results  Component Value Date   WBC 11.5 (H) 11/23/2020   HGB 7.8 (L) 11/23/2020   HCT 24.8 (L) 11/23/2020   PLT 267 11/23/2020   LYMPHOPCT 13 11/23/2020   MONOPCT 11 11/23/2020   EOSPCT 3 11/23/2020   BASOPCT 0 11/23/2020    CMP:  Lab Results  Component Value Date   NA 129 (L) 11/23/2020   K 4.3 11/23/2020   CL 104 11/23/2020   CO2 18 (L) 11/23/2020   BUN 13 11/23/2020   CREATININE 1.66 (H) 11/23/2020   PROT 4.2 (L) 11/23/2020   ALBUMIN 1.6 (L) 11/23/2020   BILITOT 0.4  11/23/2020   ALKPHOS 129 (H) 11/23/2020   AST 26 11/23/2020   ALT 20 11/23/2020  .   Total Time in preparing paper work, data evaluation and todays exam - 39 minutes  Phillips Climes M.D on 11/23/2020 at 11:17 AM  [facility]   Office  [phone]

## 2020-11-23 NOTE — Progress Notes (Signed)
DISCHARGE NOTE HOME [name] to be discharged home per MD order. Discussed prescriptions and follow up appointments with the patient. Prescriptions given to patient; medication list explained in detail. Patient verbalized understanding.  Skin clean, dry and intact without evidence of skin break down, no evidence of skin tears noted. IV catheter discontinued intact. Site without signs and symptoms of complications. Dressing and pressure applied. Pt denies pain at the site currently. No complaints noted.  Patient free of lines, drains, and wounds.   An After Visit Summary (AVS) was printed and given to the patient. Patient escorted via wheelchair, and discharged home via private auto.  Francenia Chimenti S Bradly Sangiovanni, RN

## 2020-11-23 NOTE — Progress Notes (Signed)
Bilateral upper extremities swollen. Kept both arms elevated.

## 2020-11-23 NOTE — Plan of Care (Signed)
  Problem: Clinical Measurements: Goal: Respiratory complications will improve Outcome: Progressing   

## 2020-11-23 NOTE — Progress Notes (Signed)
Occupational Therapy Treatment Patient Details Name: ANZLEE HINESLEY MRN: [id number] DOB: 07-Jul-1946 Today's Date: 11/23/2020    History of present illness 75yo female presenting with 10 day hx of nausea and vomiting. Found to be covid positive. Admitted with metabolic acidosis, NAG, and failure of bicarbonate regeneration. PMH CAD, clotting disorder, DM, chronic dizziness, HLD, DOE, HTN, seizures, CVA, femoral artery stent, patella realignment, colostomy   OT comments  Pt progressing with OT goals. Provided energy conservation education (with handout) and pt able to demonstrate implementation of techniques without verbal cues. Pt overall min guard for mobility to/from bathroom without AD, able to demo LB ADLs standing at sink with min guard. No overt LOB though pt endorses feeling fatigued after activities. Encouraged pt to have family present when attempting tub transfers initially to maximize safety.    Follow Up Recommendations  Home health OT;Supervision - Intermittent    Equipment Recommendations  None recommended by OT    Recommendations for Other Services      Precautions / Restrictions Precautions Precautions: Fall;Other (comment) Precaution Comments: colostomy, covid+ Restrictions Weight Bearing Restrictions: No       Mobility Bed Mobility               General bed mobility comments: sitting EOB on entry  Transfers Overall transfer level: Needs assistance Equipment used: None Transfers: Sit to/from [employer] Transfers Sit to Stand: Supervision Stand pivot transfers: Min guard       General transfer comment: min guard for safety in turning    Balance Overall balance assessment: Needs assistance;History of Falls Sitting-balance support: Feet supported;No upper extremity supported Sitting balance-Leahy Scale: Good     Standing balance support: No upper extremity supported;During functional activity Standing balance-Leahy Scale: Good Standing  balance comment: able to mobilize without AD though unsteadiness noted. Reaching out for one UE support at times                           ADL either performed or assessed with clinical judgement   ADL Overall ADL's : Needs assistance/impaired     Grooming: Supervision/safety;Standing;Wash/dry face;Brushing hair;Sitting Grooming Details (indicate cue type and reason): Pt able to wash face standing at sink, implementing energy conservation techniques - opted to brush hair sitting EOB Upper Body Bathing: Supervision/ safety;Standing Upper Body Bathing Details (indicate cue type and reason): Supervision in standing to bathe UB, under arms at sink Lower Body Bathing: Min guard;Sit to/from stand Lower Body Bathing Details (indicate cue type and reason): min guard for bathing peri region standing at sink to ensure safety. Some unsteadiness though no overt LOB     Lower Body Dressing: Min guard;Sit to/from stand Lower Body Dressing Details (indicate cue type and reason): able to bring feet to self for adjusting socks sitting EOB Toilet Transfer: Min guard;Ambulation;Regular Toilet;Grab bars Toilet Transfer Details (indicate cue type and reason): min guard (unsteadiness without AD) to bathroom. reports son has placed grab bar in pt bathroom at home [place] and Hygiene: Min guard;Sit to/from stand Toileting - Clothing Manipulation Details (indicate cue type and reason): min guard for clothing mgmt in standing     Functional mobility during ADLs: Min guard General ADL Comments: Educated pt in energy conservation strategies for ADLs/IADLs at home. Encouraged use of Rollator vs chair in kitchen for rest breaks during cooking tasks as pt typically walks back to couch for break. Pt able to verbalize strategies/demo implementation today. Encouraged pt to have family  with pt during tub transfers. Educated on other options (tub bench vs side stepping into tub)      Vision   Vision Assessment?: No apparent visual deficits   Perception     Praxis      Cognition Arousal/Alertness: Awake/alert Behavior During Therapy: WFL for tasks assessed/performed Overall Cognitive Status: Impaired/Different from baseline Area of Impairment: Problem solving;Awareness;Safety/judgement                         Safety/Judgement: Decreased awareness of safety;Decreased awareness of deficits Awareness: Emergent Problem Solving: Slow processing;Requires verbal cues          Exercises     Shoulder Instructions       General Comments Provided energy conservation handout. Pt on RA during session, endorses fatigue and feeling heavy but denies SOB    Pertinent Vitals/ Pain       Pain Assessment: No/denies pain Pain Intervention(s): Monitored during session  Home Living                                          Prior Functioning/Environment              Frequency  Min 2X/week        Progress Toward Goals  OT Goals(current goals can now be found in the care plan section)  Progress towards OT goals: Progressing toward goals  Acute Rehab OT Goals Patient Stated Goal: go home OT Goal Formulation: With patient Time For Goal Achievement: 12/03/20 Potential to Achieve Goals: Good ADL Goals Pt Will Perform Grooming: with modified independence;standing Pt Will Perform Lower Body Bathing: with modified independence;sit to/from stand Pt Will Perform Lower Body Dressing: with modified independence;sit to/from stand Pt Will Transfer to Toilet: with modified independence;ambulating Pt Will Perform Toileting - Clothing Manipulation and hygiene: with modified independence;sit to/from stand Additional ADL Goal #1: Pt will state at least 3 energy conservation strategies during ADL as instructed.  Plan Discharge plan remains appropriate    Co-evaluation                 AM-PAC OT "6 Clicks" Daily Activity     Outcome  Measure   Help from another person eating meals?: None Help from another person taking care of personal grooming?: A Little Help from another person toileting, which includes using toliet, bedpan, or urinal?: A Little Help from another person bathing (including washing, rinsing, drying)?: A Little Help from another person to put on and taking off regular upper body clothing?: None Help from another person to put on and taking off regular lower body clothing?: A Little 6 Click Score: 20    End of Session    OT Visit Diagnosis: Unsteadiness on feet (R26.81);Other abnormalities of gait and mobility (R26.89);Muscle weakness (generalized) (M62.81);Other symptoms and signs involving cognitive function;Other (comment)   Activity Tolerance Patient tolerated treatment well   Patient Left in chair;with call bell/phone within reach   Nurse Communication Mobility status        Time: 6967-8938 OT Time Calculation (min): 33 min  Charges: OT General Charges $OT Visit: 1 Visit OT Treatments $Self Care/Home Management : 23-37 mins  Layla Maw, OTR/L   Layla Maw 11/23/2020, 10:46 AM

## 2020-11-28 ENCOUNTER — Other Ambulatory Visit (INDEPENDENT_AMBULATORY_CARE_PROVIDER_SITE_OTHER): Payer: 59

## 2020-11-28 ENCOUNTER — Encounter: Payer: Self-pay | Admitting: Nurse Practitioner

## 2020-11-28 ENCOUNTER — Other Ambulatory Visit: Payer: Self-pay

## 2020-11-28 ENCOUNTER — Ambulatory Visit (INDEPENDENT_AMBULATORY_CARE_PROVIDER_SITE_OTHER): Payer: 59 | Admitting: Nurse Practitioner

## 2020-11-28 VITALS — BP 152/80 | HR 76 | Ht 63.0 in | Wt 186.0 lb

## 2020-11-28 DIAGNOSIS — R1013 Epigastric pain: Secondary | ICD-10-CM | POA: Diagnosis not present

## 2020-11-28 DIAGNOSIS — R110 Nausea: Secondary | ICD-10-CM | POA: Diagnosis not present

## 2020-11-28 DIAGNOSIS — D509 Iron deficiency anemia, unspecified: Secondary | ICD-10-CM

## 2020-11-28 DIAGNOSIS — R112 Nausea with vomiting, unspecified: Secondary | ICD-10-CM | POA: Diagnosis not present

## 2020-11-28 LAB — COMPREHENSIVE METABOLIC PANEL WITH GFR
ALT: 33 U/L (ref 0–35)
AST: 38 U/L — ABNORMAL HIGH (ref 0–37)
Albumin: 2.8 g/dL — ABNORMAL LOW (ref 3.5–5.2)
Alkaline Phosphatase: 179 U/L — ABNORMAL HIGH (ref 39–117)
BUN: 9 mg/dL (ref 6–23)
CO2: 26 meq/L (ref 19–32)
Calcium: 8.4 mg/dL (ref 8.4–10.5)
Chloride: 98 meq/L (ref 96–112)
Creatinine, Ser: 1.7 mg/dL — ABNORMAL HIGH (ref 0.40–1.20)
GFR: 29.57 mL/min — ABNORMAL LOW (ref >60.00)
Glucose, Bld: 89 mg/dL (ref 70–99)
Potassium: 4.5 meq/L (ref 3.5–5.1)
Sodium: 129 meq/L — ABNORMAL LOW (ref 135–145)
Total Bilirubin: 0.3 mg/dL (ref 0.2–1.2)
Total Protein: 5.9 g/dL — ABNORMAL LOW (ref 6.0–8.3)

## 2020-11-28 LAB — CBC WITH DIFFERENTIAL/PLATELET
Basophils Absolute: 0 10*3/uL (ref 0.0–0.1)
Basophils Relative: 0.4 % (ref 0.0–3.0)
Eosinophils Absolute: 0.2 10*3/uL (ref 0.0–0.7)
Eosinophils Relative: 1.8 % (ref 0.0–5.0)
HCT: 25.9 % — ABNORMAL LOW (ref 36.0–46.0)
Hemoglobin: 8.5 g/dL — ABNORMAL LOW (ref 12.0–15.0)
Lymphocytes Relative: 17.6 % (ref 12.0–46.0)
Lymphs Abs: 1.6 10*3/uL (ref 0.7–4.0)
MCHC: 32.9 g/dL (ref 30.0–36.0)
MCV: 80.7 fl (ref 78.0–100.0)
Monocytes Absolute: 0.6 10*3/uL (ref 0.1–1.0)
Monocytes Relative: 7 % (ref 3.0–12.0)
Neutro Abs: 6.6 10*3/uL (ref 1.4–7.7)
Neutrophils Relative %: 73.2 % (ref 43.0–77.0)
Platelets: 445 10*3/uL — ABNORMAL HIGH (ref 150.0–400.0)
RBC: 3.21 Mil/uL — ABNORMAL LOW (ref 3.87–5.11)
RDW: 19.4 % — ABNORMAL HIGH (ref 11.5–15.5)
WBC: 9 10*3/uL (ref 4.0–10.5)

## 2020-11-28 LAB — AMYLASE: Amylase: 85 U/L (ref 27–131)

## 2020-11-28 LAB — LIPASE: Lipase: 264 U/L — ABNORMAL HIGH (ref 11.0–59.0)

## 2020-11-28 MED ORDER — ONDANSETRON 4 MG PO TBDP: ORAL_TABLET | ORAL | 0 refills | Status: DC

## 2020-11-28 NOTE — Patient Instructions (Addendum)
If you are age 74 or older, your body mass index should be between 23-30. Your Body mass index is 32.95 kg/m. If this is out of the aforementioned range listed, please consider follow up with your Primary Care Provider.  LABS:   Labwork has been ordered for you today.  Press "B" on the elevator. The lab is located at the first door on the left as you exit the elevator.  HEALTHCARE LAWS AND MY CHART RESULTS: Due to recent changes in healthcare laws, you may see the results of your imaging and laboratory studies on MyChart before your provider has had a chance to review them.   We understand that in some cases there may be results that are confusing or concerning to you. Not all laboratory results come back in the same time frame and the provider may be waiting for multiple results in order to interpret others.  Please give [place] 48 hours in order for your provider to thoroughly review all the results before contacting the office for clarification of your results.   MEDICATION  We have sent the following medication to your pharmacy for you to pick up at your convenience:  Zofran 4 MG. Dissolve 1 tablet on tongue twice a day for 7 days, then decrease to 1 tablet every 6-8 hours as needed.  Please continue taking the Zenpep as previously ordered, please call us to verify you are taking this at home.  It was great seeing you today!  Thank you for entrusting me with your care and choosing [facility].  Noralyn Pick, CRNP

## 2020-11-28 NOTE — Progress Notes (Unsigned)
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  She was last seen in the office by Dr. Fuller Plan on 08/08/2020 regarding chronic nausea and upper abdominal pain.  At that time, her nausea and upper abdominal pain were controlled  after Lomotil was discontinued and Pantoprazole 40 mg was increased to twice daily.  She was admitted to the hospital on 11/16/2020 due to generalized weakness, persistent N/V, unable to keep po fluids down for 1 1/2 weeks with normal nonbloody ostomy output. No hematemesis. Labs showed AKI with Cr. 3.2 (baseline Cr 1.5), hyponatremia, hyperkalemia and hypomagnesemia. Positive Covid 19 (patient was full vaccinated with booster). An abdominal/pelvic CT without contrast showed a parastomal hernia, no obstruction. She received IV fluid, antiemetics and her AKI and electrolyte levels improved. She had high ostomy output which subsided after she received Imodium, Lomotil and 2 doses of Tincture of opium. Her Hg level was 10.5 -> 8.6 -> 10's -> 7.8. Iron 13. She received IV iron x 1. Lipase level 104 -> 718 -> 268 without significant abdominal pain in setting of chronic upper abdominal pain. She was discharged home on 11/23/2020.   Currently, she remains fatigued. She continues to have nausea. No further vomiting since discharged home from the hospital. She is  tolerating fluids. Appetite is low. She has a lot of leg swelling which she contribute to receiving IV fluids during her hospital admission. She continues to have generalized upper abdominal pain which is chronic, but also has brief intermittent sharp pains throughout her upper abdomen which comes and goes. She reports her ostomy output remains about the same, not excessive amounts, she empties her ostomy bag about 4 times daily. No black or bloody ostomy output. It is unclear when she last saw the general surgeon regarding her parastomal hernia. She is not taking Lomotil as this seemed to worsen her nausea in the past. She is taking Imodium 1 bid. No CP or SOB. No other complaints at this time.   CTAP 11/16/2020:  Right lower abdominal wall ileostomy with adjacent parastomal hernia. No definite evidence of obstruction. Punctate nonobstructing left renal calculi Aortic Atherosclerosis   Barium Swallow 08/30/2020: Mild aspiration with thick barium which induced coughing. Flash penetration with thin barium. Mild esophageal dysmotility. Hiatal hernia with mild narrowing of the GE junction. This temporarily delayed passage of the barium tablet.  EGD 06/27/2020: - Distal esophageal erythema. - Medium-sized hiatal hernia. - A medium amount of food (residue) in the stomach. - Normal duodenal bulb and second portion of the duodenum. - No specimens collected. Note: Patient related that she had a sandwhich at 12 noon  with EGD scheduled at 4 pm which explains retained gastric solids.  Flexible sigmoidoscopy March 2020: showed diffuse mucosal changes in the rectum and sigmoid secondary to diversion colitis  Current Outpatient Medications on File Prior to Visit  Medication Sig Dispense Refill  . acetaminophen (TYLENOL) 325 MG tablet Take 650 mg by mouth every 6 (six) hours as needed for headache (  pain).    . amLODipine (NORVASC) 10 MG tablet Take 10 mg by mouth daily.    Marland Kitchen atorvastatin  (LIPITOR) 20 MG tablet Take 20 mg by mouth daily.    . clopidogrel (PLAVIX) 75 MG tablet Take 75 mg by mouth daily.    . cyclobenzaprine (FLEXERIL) 10 MG tablet Take 0.5 tablets (5 mg total) by mouth 3 (three) times daily as needed for muscle spasms. 30 tablet 0  . diphenoxylate-atropine (LOMOTIL) 2.5-0.025 MG tablet Take 1 tablet by mouth 3 (three) times daily. 90 tablet 1  . ferrous sulfate 220 (44 Fe) MG/5ML solution Take 5 mLs (220 mg total) by mouth 2 (two) times daily with a meal. 150 mL 3  . levETIRAcetam (KEPPRA) 500 MG tablet TAKE 1 TABLET BY MOUTH TWICE A DAY (Patient taking differently: Take 500 mg by mouth 2 (two) times daily.) 180 tablet 2  . loperamide (IMODIUM) 2 MG capsule Take 2 mg by mouth daily as needed for diarrhea or loose stools.     . magnesium oxide (MAG-OX) 400 MG tablet Take 1 tablet by mouth daily.    . metoprolol tartrate (LOPRESSOR) 100 MG tablet Take 1 tablet (100 mg total) by mouth 2 (two) times daily. 60 tablet 0  . montelukast (SINGULAIR) 10 MG tablet Take 10 mg by mouth daily.    . nitroGLYCERIN (NITROSTAT) 0.4 MG SL tablet Place 1 tablet (0.4 mg total) under the tongue every 5 (five) minutes as needed for chest pain. 90 tablet 3  . Pancrelipase, Lip-Prot-Amyl, (ZENPEP) 40000-126000 units CPEP Take 2 capsules by mouth 3 (three) times daily with meals. Take 2 capsules three times a day with meals for one week and assess (Patient taking differently: Take 1 capsule by mouth daily.) 42 capsule 0  . pantoprazole (PROTONIX) 40 MG tablet Take 1 tablet (40 mg total) by mouth 2 (two) times daily. 180 tablet 3  . sodium bicarbonate 650 MG tablet Take 1 tablet (650 mg total) by mouth 2 (two) times daily. 60 tablet 1  . SODIUM FLUORIDE 5000 PPM 1.1 % PSTE SMARTSIG:Sparingly By Mouth Twice Daily    . traMADol (ULTRAM) 50 MG tablet Take 50 mg by mouth 3 (three) times daily as needed for moderate pain.    Marland Kitchen venlafaxine XR (EFFEXOR-XR) 75 MG 24 hr capsule Take 1 capsule (75 mg  total) by mouth daily.    . vitamin B-12 (CYANOCOBALAMIN) 500 MCG tablet Take 500 mcg by mouth daily.    . Vitamin D, Ergocalciferol, (DRISDOL) 1.25 MG (50000 UT) CAPS capsule Take 50,000 Units by mouth every Wednesday.      No current facility-administered medications on file prior to visit.   Allergies  Allergen Reactions  . Adhesive [Tape] Other (See Comments)    "Took off my skin"  . Codeine Other (See Comments)    Altered mental state   . Iodinated Diagnostic Agents Rash     Current Medications, Allergies, Past Medical History, Past Surgical History, Family History and Social History were reviewed in [employer] record.   Review of Systems:   Constitutional: Negative for fever, sweats, chills or weight loss.  Respiratory: Negative for shortness of breath.   Cardiovascular: + LE edema. Negative for chest pain, palpitations.  Gastrointestinal: See HPI.  Musculoskeletal: Negative for back pain or muscle aches.  Neurological: Negative for dizziness, headaches or paresthesias.    Physical Exam: BP (!) 152/80   Pulse 76   Ht 5\' 3"  (1.6 m)   Wt 186  lb (84.4 kg)   BMI 32.95 kg/m   Wt Readings from Last 3 Encounters:  11/28/20 186 lb (84.4 kg)  11/17/20 172 lb 13.5 oz (78.4 kg)  08/08/20 177 lb 3.2 oz (80.4 kg)   General: 75 year old female appears fatigued in NAD. Head: Normocephalic and atraumatic. Eyes: No scleral icterus. Conjunctiva pink . Ears: Normal auditory acuity. Mouth: Dentition intact. No ulcers or lesions. Deformed tongue with central fissure/groove mid line tongue.  Lungs: Diminished breath sounds throughout.  Heart: Regular rate and rhythm. Systolic murmur.  Abdomen: Nondistended. RUQ ostomy site beefy red/intact. Ostomy bag with approximately 200cc dark brown watery stool. Abdominal wall hernia right and left of ostomy site with parastomal hernia component. Positive bowel sounds x 4 quads.  Rectal: Deferred.  Musculoskeletal:  Symmetrical with no gross deformities. Extremities: 2+ pitting edema bilateral LEs.  Neurological: Alert oriented x 4. No focal deficits.  Psychological: Alert and cooperative. Normal mood and affect  Assessment and Recommendations:  67. 75 year old female with chronic pancreatitis/pancreatic insufficiency admitted to the hospital with intractable N/V, AKI and + Covid 19 -Lipase level, amylase, CMP, CBC -Ondansetron 4mg  ODT one po bid next 7 days then Q 6 to 8 hrs PRN -Continue Zenpep 40,000/126,00 2 capsules with each meal -Gastric empty study to rule out gastroparesis verses abdominal MRI to further evaluate the pancreas, to discuss further with Dr. Fuller Plan  -Patient to call our office if symptoms worsen  2. Covid 19 infection/pneumonia. Chest xray 11/20/2020 showed bilateral lower lobe opacities, likely reflecting a combination of atelectasis/pneumonia with small pleural effusions. -Follow up with Dr. Philip Aspen  3.  GERD, hiatal hernia. -Continue Pantoprazole 40mg  po bid  4.  History of iron deficiency anemia. B12 deficiency. Received IV iron x 1 during her hospital admission 2/2-2/07/2021. Hg 7.8 at time of discharge.  -CBC, iron, iron saturation, TIBC and ferritin level  -Continue Ferrous Sulfate 220mg /61ml bid -Continue Vitamin B12 580mcg po daily   5.S/P extended right hemicolectomy, small bowel resection and end ileostomy for ischemic bowel 2019 -Patient to monitor ostomy output, call our office if high output recurs or if significantly reduced output -Continue Imodium bid   6.  Parastomal hernia.  Periumbilical hernias. -Follow up with general surgery   7.  Personal history of tubular adenomatous polyps. -Next surveillance flexible sigmoidoscopy due March 2023  8. 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 She has 220 cm of small bowel remaining.   9. CAD. 2+ Bilateral LE edema on exam. Weight up 14lbs since hospital  admission.  -Patient advised to follow up with her cardiologist and PCP regarding edema management   10. Past CVA on Plavix  11. PAD  12. DM II  13. CKD stage 3

## 2020-11-29 ENCOUNTER — Other Ambulatory Visit: Payer: Self-pay | Admitting: Neurology

## 2020-11-29 LAB — IRON,TIBC AND FERRITIN PANEL
%SAT: 10 % (calc) — ABNORMAL LOW (ref 16–45)
Ferritin: 143 ng/mL (ref 16–288)
Iron: 30 ug/dL — ABNORMAL LOW (ref 45–160)
TIBC: 288 mcg/dL (calc) (ref 250–450)

## 2020-11-29 NOTE — Progress Notes (Signed)
Reviewed and agree with management plan. Proceed with abd MRI/MRCP to further evaluate the pancreas, biliary tree.  Return to general surgery for parastomal, periumbilical hernias.   Pricilla Riffle. Fuller Plan, MD FACG [phone]

## 2020-11-30 NOTE — Progress Notes (Signed)
[name], pls contact the patient as he was informed previously that I would review [name] office consult with Dr. Fuller Plan. See Dr. Lynne Leader addendum. Please enter a general surgery referral for further evaluation regarding [name] abdominal wall hernia and parastomal hernia. Please schedule [name] for an abdominal MRI with MRCP without contrast as she has CKD with GFR < 30 to further evaluate [name] pancreas. DX chronic pancreatitis and pancreatic insufficiency. Epigastric pain. Thx

## 2020-12-01 ENCOUNTER — Other Ambulatory Visit: Payer: Self-pay | Admitting: Nurse Practitioner

## 2020-12-01 DIAGNOSIS — K861 Other chronic pancreatitis: Secondary | ICD-10-CM

## 2020-12-01 DIAGNOSIS — R112 Nausea with vomiting, unspecified: Secondary | ICD-10-CM

## 2020-12-01 NOTE — Progress Notes (Signed)
Spoke to patient to inform her that an order has been placed for MRI/MRCP wo contrast. She will receive a call to schedule. A referral placed to [facility] for evaluation regarding her abdominal wall hernia and parastomal hernia. All questions answered. Patient voiced understanding.

## 2020-12-04 ENCOUNTER — Other Ambulatory Visit: Payer: Self-pay | Admitting: Nurse Practitioner

## 2020-12-04 DIAGNOSIS — D509 Iron deficiency anemia, unspecified: Secondary | ICD-10-CM

## 2020-12-14 ENCOUNTER — Other Ambulatory Visit: Payer: Self-pay

## 2020-12-14 ENCOUNTER — Ambulatory Visit (HOSPITAL_COMMUNITY)
Admission: RE | Admit: 2020-12-14 | Discharge: 2020-12-14 | Disposition: A | Discharge status: Home / Self Care | Payer: 59 | Source: Ambulatory Visit | Attending: Nurse Practitioner | Admitting: Nurse Practitioner

## 2020-12-14 ENCOUNTER — Other Ambulatory Visit: Payer: Self-pay | Admitting: Nurse Practitioner

## 2020-12-14 DIAGNOSIS — R112 Nausea with vomiting, unspecified: Secondary | ICD-10-CM | POA: Diagnosis not present

## 2020-12-14 DIAGNOSIS — K861 Other chronic pancreatitis: Secondary | ICD-10-CM | POA: Diagnosis present

## 2020-12-22 ENCOUNTER — Other Ambulatory Visit (INDEPENDENT_AMBULATORY_CARE_PROVIDER_SITE_OTHER): Payer: 59

## 2020-12-22 DIAGNOSIS — D509 Iron deficiency anemia, unspecified: Secondary | ICD-10-CM

## 2020-12-22 LAB — CBC
HCT: 29.5 % — ABNORMAL LOW (ref 36.0–46.0)
Hemoglobin: 9.6 g/dL — ABNORMAL LOW (ref 12.0–15.0)
MCHC: 32.5 g/dL (ref 30.0–36.0)
MCV: 80.7 fl (ref 78.0–100.0)
Platelets: 419 10*3/uL — ABNORMAL HIGH (ref 150.0–400.0)
RBC: 3.66 Mil/uL — ABNORMAL LOW (ref 3.87–5.11)
RDW: 19.5 % — ABNORMAL HIGH (ref 11.5–15.5)
WBC: 10.1 10*3/uL (ref 4.0–10.5)

## 2021-01-01 ENCOUNTER — Other Ambulatory Visit: Payer: Self-pay

## 2021-01-01 DIAGNOSIS — D509 Iron deficiency anemia, unspecified: Secondary | ICD-10-CM

## 2021-01-17 ENCOUNTER — Telehealth: Payer: Self-pay | Admitting: Gastroenterology

## 2021-01-17 NOTE — Telephone Encounter (Signed)
Returned call and left message on Dr. Girtha Rm voicemail.

## 2021-01-17 NOTE — Telephone Encounter (Signed)
Dr. Victorio Palm called from [facility] regarding patient being sent for a ileostomy reversal procedure.  Tel: [phone]

## 2021-01-26 ENCOUNTER — Other Ambulatory Visit: Payer: Self-pay | Admitting: Neurology

## 2021-02-12 ENCOUNTER — Other Ambulatory Visit: Payer: Self-pay

## 2021-02-16 NOTE — Progress Notes (Signed)
Cardiology Clinic Note   Patient Name: [name] Date of Encounter: 02/19/2021  Primary Care Provider:  Leanna Battles, MD Primary Cardiologist:  Peter Martinique, MD  Patient Profile    [name] 75 year old female presents the clinic today for follow-up evaluation of her coronary artery disease.  Past Medical History    Past Medical History:  Diagnosis Date  . Allergy   . Anemia    past hx per pt   . Anxiety   . Arthritis    back   . Arthropathy, unspecified, site unspecified   . Blind loop syndrome   . Carotid artery disease ([place])    documented occlusion of left ICA  . Cataract    removed both eyes   . Clotting disorder (H[facility])    Hx Clot in leg per pt   . Colostomy present ([place])   . Coronary artery disease    a. known 60-70% mid-LAD stenosis with caths in 1999, 2002, 2006, and 2012 showing stable anatomy b. low-risk NST in 10/2015  . Depression   . Diabetes insipidus ([place])   . Diabetes mellitus   . Diverticulosis   . Dizziness    chronic  . Dyslipidemia   . Dyspnea    with exertion  . Fatty liver   . GERD (gastroesophageal reflux disease)    hiatial hernia  . Heart murmur   . Hepatomegaly   . Hiatal hernia   . HTN (hypertension)   . Hyperlipidemia   . Hypertension   . Irritable bowel syndrome   . Melanosis coli   . Metatarsal fracture right  . Normal nuclear stress test    nml 01/03/10;06/19/07  . Other, mixed, or unspecified nondependent drug abuse, unspecified   . Pancreatitis, chronic ([place])   . Peripheral vascular disease ([place]) 02/20/10   s/p stent of left SFA  . Seizure ([place]) 11/04/2018   last 1 yr 2020 per pt unsure month   . Sleep apnea    no cpap   . Stroke ([place])   . Thyroid nodule   . Unspecified essential hypertension   . Vitamin B12 deficiency    Past Surgical History:  Procedure Laterality Date  . ABDOMINAL HYSTERECTOMY     partial  . AUGMENTATION MAMMAPLASTY     bilateral 1976 retro   . BREAST ENHANCEMENT SURGERY    .  CARDIAC CATHETERIZATION  07/07/98;08/31/01;03/04/05   60 -70% LAD  . CATARACT EXTRACTION    . COLON RESECTION N/A 10/14/2017   Procedure: EXPLORATORY LAPAROTOMY, EXTENDED RIGHT COLECTOMY; APPLICATION OF ABDOMINAL VACUUM DRESSING;  Surgeon: Jovita Kussmaul, MD;  Location: [facility];  Service: General;  Laterality: N/A;  . COLONOSCOPY    . ENDOBRONCHIAL ULTRASOUND Bilateral 02/24/2017   Procedure: ENDOBRONCHIAL ULTRASOUND;  Surgeon: Collene Gobble, MD;  Location: [facility];  Service: Cardiopulmonary;  Laterality: Bilateral;  . EYE SURGERY Left Nov. 2016   Cataract  . EYE SURGERY Right 2001   Cataract  . FEMORAL ARTERY STENT     Left leg  . LAPAROTOMY N/A 10/16/2017   Procedure: EXPLORATORY LAPAROTOMY, PARTIAL OMENTECTOMY, RESECTION ISCHEMIC ILEUM 195KD, APPLICATION OF VAC ABDOMINAL DRESSING;  Surgeon: Armandina Gemma, MD;  Location: [facility];  Service: General;  Laterality: N/A;  . LAPAROTOMY N/A 10/18/2017   Procedure: RE-EXPLORATION OF ABDOMEN, ILEOSTOMY CREATION;  Surgeon: Stark Klein, MD;  Location: [facility];  Service: General;  Laterality: N/A;  . LUNG SURGERY  03/2017   Benign polyps removed  . PATELLA REALIGNMENT Left   .  POLYPECTOMY    . SIGMOIDOSCOPY    . TONSILLECTOMY    . TOTAL ABDOMINAL HYSTERECTOMY    . WRIST SURGERY     right    Allergies  Allergies  Allergen Reactions  . Adhesive [Tape] Other (See Comments)    "Took off my skin"  . Codeine Other (See Comments)    Altered mental state   . Iodinated Diagnostic Agents Rash    History of Present Illness    [name] has a PMH of carotid artery disease.  She has known occlusions of her  left ICA.  PMH also includes coronary artery disease type 2 diabetes, diabetes insipidus, dizziness, hypertension, GERD, chronic pancreatitis, CVA, and PAD stent to her left SFA.  She underwent cardiac catheterization in 1999, 2002, 2016, 2012.  She has known 60-70% stenosis of her mid LAD.  Her 2012 cardiac catheterization showed stable anatomy.   Her carotid artery disease is followed by vein vascular surgery.  Lower extremity Dopplers 2/18 showed normal flow in the right, mild occlusive disease on the left.  Her carotid Dopplers 11/26/2016 showed greater than 50% left mid common carotid artery stenosis with known left internal carotid occlusion, right internal carotid artery suggestive of 40- 59% stenosis.  She underwent nuclear stress test 3/20 which showed normal perfusion with an EF of 45-50%.  She was admitted 11/20 with AKI due to high output ileostomy and ARB.  January 2021 she was admitted with acute respiratory failure and encephalopathy related to COVID-19 pneumonia.  She was seen by Dr. Martinique on 05/30/2020.  During that time she reported that she would get tired easily and was unable to walk very far before giving out.  She denied chest pain.  She reported back problems and also noted occasional frontal type headaches.  She presents the clinic today for follow-up evaluation states she feels well.  She reports that she is staying somewhat physically active doing housework.  She reports that she is limited in her physical activity due to her chronic back pain.  She reports following a strict diet but reports she is limited in food she can eat due to her ostomy.  She reports that she would like to have her ostomy reversed and presents today stating that she has had a consultation with a physician in [place].  She does not know the name of the practice or the physician.  I have asked her to have the office send [place] a preoperative cardiac clearance request.  I will have her follow-up with Dr. Martinique in 6 months.  Today she denies chest pain, shortness of breath, increased lower extremity edema, fatigue, palpitations, melena, hematuria, hemoptysis, diaphoresis, weakness, presyncope, syncope, orthopnea, and PND.   Home Medications    Prior to Admission medications   Medication Sig Start Date End Date Taking? Authorizing Provider   acetaminophen (TYLENOL) 325 MG tablet Take 650 mg by mouth every 6 (six) hours as needed for headache (pain).    [provider]  amLODipine (NORVASC) 10 MG tablet Take 10 mg by mouth daily.    [provider]  atorvastatin (LIPITOR) 20 MG tablet Take 20 mg by mouth daily.    [provider]  clopidogrel (PLAVIX) 75 MG tablet Take 75 mg by mouth daily.    [provider]  cyclobenzaprine (FLEXERIL) 10 MG tablet Take 0.5 tablets (5 mg total) by mouth 3 (three) times daily as needed for muscle spasms. 11/23/20   Elgergawy, Silver Huguenin, MD  diphenoxylate-atropine (LOMOTIL) 2.5-0.025  MG tablet Take 1 tablet by mouth 3 (three) times daily. 08/27/19   Domenic Polite, MD  ferrous sulfate 220 (44 Fe) MG/5ML solution Take 5 mLs (220 mg total) by mouth 2 (two) times daily with a meal. 11/23/20   Elgergawy, Silver Huguenin, MD  levETIRAcetam (KEPPRA) 500 MG tablet TAKE 1 TABLET BY MOUTH TWICE A DAY Patient taking differently: Take 500 mg by mouth 2 (two) times daily. 01/20/20   Dohmeier, Asencion Partridge, MD  loperamide (IMODIUM) 2 MG capsule Take 2 mg by mouth daily as needed for diarrhea or loose stools.     [provider]  magnesium oxide (MAG-OX) 400 MG tablet Take 1 tablet by mouth daily. 05/08/20   [provider]  metoprolol tartrate (LOPRESSOR) 100 MG tablet Take 1 tablet (100 mg total) by mouth 2 (two) times daily. 11/23/20   Elgergawy, Silver Huguenin, MD  montelukast (SINGULAIR) 10 MG tablet Take 10 mg by mouth daily. 08/09/18   [provider]  nitroGLYCERIN (NITROSTAT) 0.4 MG SL tablet Place 1 tablet (0.4 mg total) under the tongue every 5 (five) minutes as needed for chest pain. 11/08/15   Martinique, Peter M, MD  ondansetron (ZOFRAN-ODT) 4 MG disintegrating tablet Dissolve 1 tablet on tongue twice a day for 7 days then decrease to 1 tablet every 6-8 hours as needed. 11/28/20   Noralyn Pick, NP  Pancrelipase, Lip-Prot-Amyl, (ZENPEP) 40000-126000 units CPEP  Take 2 capsules by mouth 3 (three) times daily with meals. Take 2 capsules three times a day with meals for one week and assess Patient taking differently: Take 1 capsule by mouth daily. 06/27/20   Ladene Artist, MD  pantoprazole (PROTONIX) 40 MG tablet Take 1 tablet (40 mg total) by mouth 2 (two) times daily. 06/27/20   Ladene Artist, MD  SODIUM FLUORIDE 5000 PPM 1.1 % PSTE SMARTSIG:Sparingly By Mouth Twice Daily 05/16/20   [provider]  traMADol (ULTRAM) 50 MG tablet Take 50 mg by mouth 3 (three) times daily as needed for moderate pain. 06/01/20   [provider]  venlafaxine XR (EFFEXOR-XR) 75 MG 24 hr capsule Take 1 capsule (75 mg total) by mouth daily. 05/18/20   [provider]  vitamin B-12 (CYANOCOBALAMIN) 500 MCG tablet Take 500 mcg by mouth daily.    [provider]  Vitamin D, Ergocalciferol, (DRISDOL) 1.25 MG (50000 UT) CAPS capsule Take 50,000 Units by mouth every Wednesday.     [provider]    Family History    Family History  Problem Relation Age of Onset  . Hypertension Mother   . Osteoarthritis Mother   . Pulmonary embolism Mother   . Hypertension Father        aneurysm  . Stroke Father   . Stroke Other   . Colon cancer Paternal Grandfather   . Breast cancer Neg Hx   . Colon polyps Neg Hx   . Esophageal cancer Neg Hx   . Rectal cancer Neg Hx   . Stomach cancer Neg Hx    She indicated that her mother is deceased. She indicated that her father is deceased. She indicated that her maternal grandmother is deceased. She indicated that her maternal grandfather is deceased. She indicated that her paternal grandmother is deceased. She indicated that her paternal grandfather is deceased. She indicated that the status of her neg hx is unknown. She indicated that the status of her other is unknown.  Social History    Social History   Socioeconomic History  .  Marital status: Divorced    Spouse name: Not on file  . Number of  children: 2  . Years of education: Not on file  . Highest education level: Not on file  Occupational History  . Occupation: retired    Fish farm manager: DISABLED  Tobacco Use  . Smoking status: Former Smoker    Packs/day: 1.00    Years: 50.00    Pack years: 50.00    Types: Cigarettes    Quit date: 10/14/2010    Years since quitting: 10.3  . Smokeless tobacco: Never Used  Vaping Use  . Vaping Use: Never used  Substance and Sexual Activity  . Alcohol use: No  . Drug use: No  . Sexual activity: Not on file  Other Topics Concern  . Not on file  Social History Narrative  . Not on file   Social Determinants of Health   Financial Resource Strain: Not on file  Food Insecurity: Not on file  Transportation Needs: Not on file  Physical Activity: Not on file  Stress: Not on file  Social Connections: Not on file  Intimate Partner Violence: Not on file     Review of Systems    General:  No chills, fever, night sweats or weight changes.  Cardiovascular:  No chest pain, dyspnea on exertion, edema, orthopnea, palpitations, paroxysmal nocturnal dyspnea. Dermatological: No rash, lesions/masses Respiratory: No cough, dyspnea Urologic: No hematuria, dysuria Abdominal:   No nausea, vomiting, diarrhea, bright red blood per rectum, melena, or hematemesis Neurologic:  No visual changes, wkns, changes in mental status. All other systems reviewed and are otherwise negative except as noted above.  Physical Exam    VS:  BP 126/64   Pulse 80   Ht 5\' 3"  (1.6 m)   Wt 170 lb 9.6 oz (77.4 kg)   SpO2 99%   BMI 30.22 kg/m  , BMI Body mass index is 30.22 kg/m. GEN: Well nourished, well developed, in no acute distress. HEENT: normal. Neck: Supple, no JVD, carotid bruits, or masses. Cardiac: RRR, no murmurs, rubs, or gallops. No clubbing, cyanosis, generalized bilateral lower extremity nonpitting edema.  Radials/DP/PT 2+ and equal bilaterally.  Respiratory:  Respirations regular and unlabored, clear to  auscultation bilaterally. GI: Soft, nontender, nondistended, BS + x 4. MS: no deformity or atrophy. Skin: warm and dry, no rash. Neuro:  Strength and sensation are intact. Psych: Normal affect.  Accessory Clinical Findings    Recent Labs: 11/17/2020: TSH 0.775 11/23/2020: B Natriuretic Peptide 364.9; Magnesium 1.6 11/28/2020: ALT 33; BUN 9; Creatinine, Ser 1.70; Potassium 4.5; Sodium 129 12/22/2020: Hemoglobin 9.6; Platelets 419.0   Recent Lipid Panel    Component Value Date/Time   CHOL 106 10/25/2017 0440   TRIG 215 (H) 10/15/2019 2123   HDL 18 (L) 10/25/2017 0440   CHOLHDL 5.9 10/25/2017 0440   VLDL 40 10/25/2017 0440   LDLCALC 48 10/25/2017 0440    ECG personally reviewed by me today-none today.  Nuclear stress test 12/18/2018    The left ventricular ejection fraction is mildly decreased (45-54%).  Nuclear stress EF: 48%.  No T wave inversion was noted during stress.  There was no ST segment deviation noted during stress.  This is an intermediate risk study.   No reversible ischemia. LVEF 48% with basal septal dyskinesis. This is an intermediate risk study (LVEF<49%) - recommend correlating findings with echo.  Echocardiogram 11/17/2020 IMPRESSIONS    1. Left ventricular ejection fraction, by estimation, is 45 to 50%. The  left ventricle has mildly decreased  function. The left ventricle  demonstrates regional wall motion abnormalities (see scoring  diagram/findings for description). There is moderate  left ventricular hypertrophy.  2. The mitral valve was not well visualized. No evidence of mitral valve  regurgitation.  3. The aortic valve is tricuspid. Mild aortic valve sclerosis is present,  with no evidence of aortic valve stenosis.   Comparison(s): Prior images reviewed side by side. Changes from prior  study are noted.   Assessment & Plan   1.  Coronary artery disease- no chest pain today.  No recent episodes of chest discomfort,arm, or neck pain.   Nuclear stress test 2020 and EF of 48% and no reversible ischemia.  She has known 60 to 70% LAD stenosis on multiple previous LHC. Continue amlodipine, metoprolol, nitroglycerin, Plavix, atorvastatin Heart healthy low-sodium diet-salty 6 given Increase physical activity as tolerated  Essential hypertension-BP today 126/64.  Well-controlled at home. Continue amlodipine, metoprolol Heart healthy low-sodium diet-salty 6 given Increase physical activity as tolerated  Bilateral carotid artery disease- carotid Dopplers 7/21 showed 40--59% right ICA, and total occlusion of left carotid artery Continue atorvastatin Heart healthy low-sodium high-fiber diet  OSA- reports CPAP compliance CPAP. Continue CPAP use  Peripheral arterial disease- denies claudication Continue atorvastatin Heart healthy low-sodium high-fiber diet Maintain physical activity Follows with pain vascular surgery  Hyperlipidemia- LDL 48 on 1/19 Continue atorvastatin Heart healthy low-sodium high-fiber diet Maintain physical activity Follows with PCP  Disposition: Follow-up with Dr. Martinique in 6 months.   Jossie Ng. Ignatz Deis NP-C    02/19/2021, 8:50 AM [address] Office [phone] Fax [phone]  Notice: This dictation was prepared with Dragon dictation along with smaller phrase technology. Any transcriptional errors that result from this process are unintentional and may not be corrected upon review.  I spent 12 minutes examining this patient, reviewing medications, and using patient centered shared decision making involving her cardiac care.  Prior to her visit I spent greater than 20 minutes reviewing her past medical history,  medications, and prior cardiac tests.

## 2021-02-19 ENCOUNTER — Encounter: Payer: Self-pay | Admitting: General Practice

## 2021-02-19 ENCOUNTER — Other Ambulatory Visit: Payer: Self-pay

## 2021-02-19 ENCOUNTER — Ambulatory Visit (INDEPENDENT_AMBULATORY_CARE_PROVIDER_SITE_OTHER): Payer: 59 | Admitting: General Practice

## 2021-02-19 VITALS — BP 126/64 | HR 80 | Ht 63.0 in | Wt 170.6 lb

## 2021-02-19 DIAGNOSIS — I779 Disorder of arteries and arterioles, unspecified: Secondary | ICD-10-CM

## 2021-02-19 DIAGNOSIS — G4733 Obstructive sleep apnea (adult) (pediatric): Secondary | ICD-10-CM

## 2021-02-19 DIAGNOSIS — I6521 Occlusion and stenosis of right carotid artery: Secondary | ICD-10-CM | POA: Diagnosis not present

## 2021-02-19 DIAGNOSIS — I2510 Atherosclerotic heart disease of native coronary artery without angina pectoris: Secondary | ICD-10-CM | POA: Diagnosis not present

## 2021-02-19 DIAGNOSIS — I6522 Occlusion and stenosis of left carotid artery: Secondary | ICD-10-CM | POA: Diagnosis not present

## 2021-02-19 DIAGNOSIS — I10 Essential (primary) hypertension: Secondary | ICD-10-CM

## 2021-02-19 DIAGNOSIS — I739 Peripheral vascular disease, unspecified: Secondary | ICD-10-CM

## 2021-02-19 DIAGNOSIS — E782 Mixed hyperlipidemia: Secondary | ICD-10-CM

## 2021-02-19 NOTE — Patient Instructions (Addendum)
Medication Instructions:  Your physician recommends that you continue on your current medications as directed. Please refer to the Current Medication list given to you today.  *If you need a refill on your cardiac medications before your next appointment, please call your pharmacy*  Lab Work: NONE ordered at this time of appointment   If you have labs (blood work) drawn today and your tests are completely normal, you will receive your results only by: Marland Kitchen MyChart Message (if you have MyChart) OR . A paper copy in the mail If you have any lab test that is abnormal or we need to change your treatment, we will call you to review the results.  Testing/Procedures: NONE ordered at this time of appointment   Follow-Up: At [facility], you and your health needs are our priority.  As part of our continuing mission to provide you with exceptional heart care, we have created designated Provider Care Teams.  These Care Teams include your primary Cardiologist (physician) and Advanced Practice Providers (APPs -  Physician Assistants and Nurse Practitioners) who all work together to provide you with the care you need, when you need it.  We recommend signing up for the patient portal called "MyChart".  Sign up information is provided on this After Visit Summary.  MyChart is used to connect with patients for Virtual Visits (Telemedicine).  Patients are able to view lab/test results, encounter notes, upcoming appointments, etc.  Non-urgent messages can be sent to your provider as well.   To learn more about what you can do with MyChart, go to [email or web address].    Your next appointment:   6 month(s)  The format for your next appointment:   In Person  Provider:   Peter Martinique, MD   Other Instructions      The Journal of Orthopaedic and Sports Physical Therapy, 44(10), 748. [email or web address]">  How to Increase Your Level of Physical Activity Getting regular physical  activity is important for your overall health and well-being. Most people do not get enough exercise. There are easy ways to increase your level of physical activity, even if you have not been very active in the past or if you are just starting out. How can increasing my physical activity affect me? Physical activity has many short-term and long-term benefits. Being active on a regular basis can improve your physical and mental health as well as provide other benefits. Physical health benefits  Helping you lose weight or maintain a healthy weight.  Strengthening your muscles and bones.  Reducing your risk of certain long-term (chronic) diseases, including heart disease, cancer, and diabetes.  Being able to move around more easily and for longer periods of time without getting tired (increased stamina).  Improving your ability to fight off illness (enhanced immunity).  Being able to sleep better.  Helping you stay healthy as you get older, including: ? Helping you stay mobile, or capable of walking and moving around. ? Preventing accidents, such as falls. ? Increasing life expectancy. Mental health benefits  Boosting your mood and improving your self-esteem.  Lowering your chance of having mental health problems, such as depression or anxiety.  Helping you feel good about your body. Other benefits  Finding new sources of fun and enjoyment.  Meeting new people who share a common interest. What steps can I take to be more physically active? Getting started  If you have a chronic illness or have not been active for a while, check with your health care provider about how to  get started. Ask your health care provider what activities are safe for you.  Start out slowly. Walking or doing some simple chair exercises is a good place to start, especially if you have not been active before or for a long time.  Set goals that you can work toward. Ask your health care provider how much  exercise is best for you. In general, most adults should: ? Do moderate-intensity exercise for at least 150 minutes each week (30 minutes on most days of the week) or vigorous exercise for at least 75 minutes each week, or a combination of these.  Moderate-intensity exercise can include walking at a quick pace, biking, yoga, water aerobics, or gardening.  Vigorous exercise involves activities that take more effort, such as jogging or running, playing sports, swimming laps, or jumping rope. ? Do strength exercises on at least 2 days each week. This can include weight lifting, body weight exercises, and resistance-band exercises.  Consider using a fitness tracker, such as a mobile phone app or a device worn like a watch, that will count the number of steps you take each day. Many people strive to reach 10,000 steps a day. Choosing activities  Try to find activities that you enjoy. You are more likely to commit to an exercise routine if it does not feel like a chore.  If you have bone or joint problems, choose low-impact exercises, like walking or swimming.  Use these tips for being successful with an exercise plan: ? Find a workout partner for accountability. ? Join a group or class, such as an aerobics class, cycling class, or sports team. ? Make family time active. Go for a walk, bike, or swim. ? Include a variety of exercises each week. Being active in your daily routines Besides your formal exercise plans, you can find ways to do physical activity during your daily routines, such as:  Walking or biking to work or to the store.  Taking the stairs instead of the elevator.  Parking farther away from the door at work or at the store.  Planning walking meetings.  Walking around while you are on the phone.   Where to find more information  Centers for Disease Control and Prevention: WorkDashboard.es  President's Council on Fitness, Sports & Nutrition:  www.fitness.gov  ChooseMyPlate: FormerBoss.no Contact a health care provider if:  You have headaches, muscle aches, or joint pain.  You feel dizzy or light-headed while exercising.  You faint.  You have chest pain while exercising. Summary  Exercise benefits your mind and body at any age, even if you are just starting out.  If you have a chronic illness or have not been active for a while, check with your health care provider before increasing your physical activity.  Choose activities that are safe and enjoyable for you. Ask your health care provider what activities are safe for you.  Start slowly. Tell your health care provider if you have problems as you start to increase your activity level. This information is not intended to replace advice given to you by your health care provider. Make sure you discuss any questions you have with your health care provider. Document Revised: 07/05/2019 Document Reviewed: 04/26/2019 Elsevier Patient Education  [place].

## 2021-02-20 ENCOUNTER — Telehealth: Payer: Self-pay

## 2021-02-20 NOTE — Telephone Encounter (Signed)
   [facility] Pre-operative Risk Assessment    Request for surgical clearance:  1. What type of surgery is being performed? COLONOSCOPY/OSTOMY TAKEDOWN   2. When is this surgery scheduled? TBD   3. What type of clearance is required (medical clearance vs. Pharmacy clearance to hold med vs. Both)? BOTH  4. Are there any medications that need to be held prior to surgery and how long? PLAVIX   5. Practice name and name of physician performing surgery? [facility] ATTN:JANET   6. What is the office phone number? [phone]   7.   What is the office fax number? [phone]  8.   Anesthesia type (None, local, MAC, general) ? LM2CB -NOT LISTED

## 2021-02-21 NOTE — Telephone Encounter (Signed)
[facility] sent another cardiac clearance request that includes request to hold Pletal for 2 day before and Plavix for 5 days prior.

## 2021-02-21 NOTE — Telephone Encounter (Signed)
    [name] DOB:  04-06-1946  MRN:  [id number]   Primary Cardiologist: Peter Martinique, MD  Chart reviewed as part of pre-operative protocol coverage. Patient was seen by Coletta Memos, NP 02/19/21 and was without anginal complaints at that time. As such, [name] would be at acceptable risk for the planned procedure without further cardiovascular testing.   The patient was advised that if she develops new symptoms prior to surgery to contact our office to arrange for a follow-up visit, and she verbalized understanding.  Per previous recommendations, patient can hold plavix 5 days prior to her upcoming GI procedure. We also received a request to hold pletal, though I do not see this medication listed on her med rec.   I will route this recommendation to the requesting party via Epic fax function and remove from pre-op pool.  Please call with questions.  [name], PA-C 02/21/2021, 3:05 PM

## 2021-02-27 ENCOUNTER — Other Ambulatory Visit (INDEPENDENT_AMBULATORY_CARE_PROVIDER_SITE_OTHER): Payer: 59

## 2021-02-27 DIAGNOSIS — D509 Iron deficiency anemia, unspecified: Secondary | ICD-10-CM

## 2021-02-27 LAB — IBC + FERRITIN
Ferritin: 5.4 ng/mL — ABNORMAL LOW (ref 10.0–291.0)
Iron: 23 ug/dL — ABNORMAL LOW (ref 42–145)
Saturation Ratios: 3.9 % — ABNORMAL LOW (ref 20.0–50.0)
Transferrin: 420 mg/dL — ABNORMAL HIGH (ref 212.0–360.0)

## 2021-02-27 LAB — CBC
HCT: 26.7 % — ABNORMAL LOW (ref 36.0–46.0)
Hemoglobin: 8.7 g/dL — ABNORMAL LOW (ref 12.0–15.0)
MCHC: 32.7 g/dL (ref 30.0–36.0)
MCV: 80.4 fl (ref 78.0–100.0)
Platelets: 297 10*3/uL (ref 150.0–400.0)
RBC: 3.32 Mil/uL — ABNORMAL LOW (ref 3.87–5.11)
RDW: 18 % — ABNORMAL HIGH (ref 11.5–15.5)
WBC: 9 10*3/uL (ref 4.0–10.5)

## 2021-03-06 ENCOUNTER — Other Ambulatory Visit: Payer: Self-pay

## 2021-03-06 ENCOUNTER — Other Ambulatory Visit (INDEPENDENT_AMBULATORY_CARE_PROVIDER_SITE_OTHER): Payer: 59

## 2021-03-06 ENCOUNTER — Telehealth: Payer: Self-pay

## 2021-03-06 DIAGNOSIS — D509 Iron deficiency anemia, unspecified: Secondary | ICD-10-CM

## 2021-03-06 LAB — CBC WITH DIFFERENTIAL/PLATELET
Basophils Absolute: 0.1 10*3/uL (ref 0.0–0.1)
Basophils Relative: 0.7 % (ref 0.0–3.0)
Eosinophils Absolute: 0.4 10*3/uL (ref 0.0–0.7)
Eosinophils Relative: 3.7 % (ref 0.0–5.0)
HCT: 27.4 % — ABNORMAL LOW (ref 36.0–46.0)
Hemoglobin: 8.8 g/dL — ABNORMAL LOW (ref 12.0–15.0)
Lymphocytes Relative: 26 % (ref 12.0–46.0)
Lymphs Abs: 2.6 10*3/uL (ref 0.7–4.0)
MCHC: 32.1 g/dL (ref 30.0–36.0)
MCV: 80.7 fl (ref 78.0–100.0)
Monocytes Absolute: 0.8 10*3/uL (ref 0.1–1.0)
Monocytes Relative: 7.9 % (ref 3.0–12.0)
Neutro Abs: 6.2 10*3/uL (ref 1.4–7.7)
Neutrophils Relative %: 61.7 % (ref 43.0–77.0)
Platelets: 285 10*3/uL (ref 150.0–400.0)
RBC: 3.39 Mil/uL — ABNORMAL LOW (ref 3.87–5.11)
RDW: 18.1 % — ABNORMAL HIGH (ref 11.5–15.5)
WBC: 10.1 10*3/uL (ref 4.0–10.5)

## 2021-03-06 NOTE — Telephone Encounter (Signed)
Colleen, see Sheri's note. You or Dr Fuller Plan have to put in orders for the iron infusion.

## 2021-03-06 NOTE — Telephone Encounter (Signed)
Dr Fuller Plan would like the patient to receive an iron infusion.

## 2021-03-06 NOTE — Telephone Encounter (Signed)
A therapy plan will need to be placed in the [place] que.  Our pharmacist will assist if help is needed entering orders.

## 2021-03-06 NOTE — Telephone Encounter (Signed)
Beth, Dr. Fuller Plan or APP will need to place the orders for the iron infusion.  They said they would do a benefits investigation

## 2021-03-09 ENCOUNTER — Other Ambulatory Visit: Payer: Self-pay

## 2021-03-09 DIAGNOSIS — D509 Iron deficiency anemia, unspecified: Secondary | ICD-10-CM

## 2021-03-09 NOTE — Telephone Encounter (Signed)
Beth, pls initiate hematology referral for IV iron. DX IDA.  Pls let me know when patient has a confirmed appointment with hematology. Thx

## 2021-03-13 ENCOUNTER — Encounter: Payer: Self-pay | Admitting: Nurse Practitioner

## 2021-03-13 ENCOUNTER — Telehealth: Payer: Self-pay | Admitting: Pharmacy Technician

## 2021-03-13 NOTE — Telephone Encounter (Signed)
Hello Joelene Millin, can you provide me with the infusion pharmacy contact infor? I had a few questions about the Feraheme orders I entered for the infusion center. Thank you. [employer]

## 2021-03-13 NOTE — Telephone Encounter (Signed)
Auth Follow-up: NO AUTH REQUIRED Payer: UHC/MEDICARE Source of follow up: portal/phone/fax:  Phone #: [phone] Rep Name:  Reference number: [id number] Medication & CPT/J Code(s) : Feraheme (ferumoxytol) Q0138  BUY/BILL: 2  Status: NO AUTH REQUIRED  REF# [id number]

## 2021-03-13 NOTE — Telephone Encounter (Signed)
[name], thank you for the return call. As we discussed over the phone, since the patient does not yet have an appointment with hematology I entered Feraheme infusion orders to include one infusion asap and a 2nd infusion 1 week later with our [facility] infusion center. I will let you know if there is any change in our plan. Thank you.  [employer]

## 2021-03-13 NOTE — Telephone Encounter (Signed)
I am checking the referral daily. The patient is concerned her surgery will be cancelled.

## 2021-03-13 NOTE — Telephone Encounter (Signed)
Hello.  My desk [phone]. You also can leave message in our POOL,  ([facility]) [facility]) or myself will receive them. Thanks. Maudie Mercury

## 2021-03-13 NOTE — Telephone Encounter (Signed)
Sheri, please review the iron infusion orders I entered in the infusion navigator.  Per Dr. Lynne Leader prior communication, he recommended iron infusion x 1 with our infusion center then patient to follow up with hematology for further anemia/IV iron management.

## 2021-03-14 ENCOUNTER — Telehealth: Payer: Self-pay | Admitting: Hematology and Oncology

## 2021-03-14 NOTE — Telephone Encounter (Signed)
Received a new pt referral from [facility] for IDA. MS. Brotz returned my call and has been scheduled to see Dr. Lorenso Courier on 6/3 at 9am. Pt aware to arrive 20 minutes early.

## 2021-03-14 NOTE — Telephone Encounter (Signed)
I called the patient to give her an update on what we are trying to accomplish. She says she appreciates our efforts.

## 2021-03-15 NOTE — Progress Notes (Signed)
[place] [phone]   Fax:([phone]) [place] NOTE  Patient Care Team: Leanna Battles, MD as PCP - General (Internal Medicine) Martinique, Peter M, MD as PCP - Cardiology (Cardiology) Jovita Kussmaul, MD as Consulting Physician (General Surgery)  Hematological/Oncological History # Iron Deficiency Anemia  03/06/2021: WBC 10.1, Hgb 8.8, MCV 80.7, Plt 285 03/16/2021: establish care with Dr. Lorenso Courier  CHIEF COMPLAINTS/PURPOSE OF CONSULTATION:  "Iron Deficiency anemia "  HISTORY OF PRESENTING ILLNESS:  [name] 75 y.o. female with medical history significant for PVD, carotid artery disease, depression, GERD, HTN, DM type II, IBS, sleep apnea not on CPAP, CKD, NAFLD, CVA, COPD and chronic pancreatitis who presents for evaluation of iron deficiency anemia.  On review of the previous records [name] has a longstanding history of anemia dating back to at least early 2021, with some low-grade anemia in late 2020.  For most of 2022 the patient has had a baseline hemoglobin between 8 and 9.  Most recently on 02/14/2021 the patient had a CBC drawn which showed a white blood cell count of 10.1, hemoglobin 8.8, MCV of 80.7, and a platelet count of 25. Iron panel on 02/27/2021 showed an iron of 23, saturation of 3.9, and ferritin of 5.4.  Due to concern for this patient's iron deficiency anemia she was referred to hematology for further evaluation management.  Of note gastroenterology has plan to schedule the patient for 2 doses of IV Feraheme at her outpatient infusion center.  On exam today [name] is accompanied by her friend.  She notes that she is "tired all the time".  And that she has an upcoming surgery on 03/23/2021 for revision of an ostomy.  She reports this will be performed at [facility].  She recently began having some blood discharge from her rectum which is currently a blind end.  She notes that she is been wearing a pad in  order to catch this blood and she has to change this pad about 6-10 times a day, though it is very rarely saturated and more than a little leak.  She does that she does not currently have any dietary restrictions and eats red meat and veggies without issue.  She is not currently taking any iron pills and did recently take some liquid iron "a few weeks back".  She notes that she has never received any doses of IV iron.  She currently denies any fevers, chills, sweats, nausea, vomiting or diarrhea.  She does endorse a baseline shortness of breath and fatigue.  Full 10 point ROS is listed below.  MEDICAL HISTORY:  Past Medical History:  Diagnosis Date  . Allergy   . Anemia    past hx per pt   . Anxiety   . Arthritis    back   . Arthropathy, unspecified, site unspecified   . Blind loop syndrome   . Carotid artery disease ([place])    documented occlusion of left ICA  . Cataract    removed both eyes   . Clotting disorder (H[facility])    Hx Clot in leg per pt   . Colostomy present ([place])   . Coronary artery disease    a. known 60-70% mid-LAD stenosis with caths in 1999, 2002, 2006, and 2012 showing stable anatomy b. low-risk NST in 10/2015  . Depression   . Diabetes insipidus ([place])   . Diabetes mellitus   . Diverticulosis   . Dizziness    chronic  .  Dyslipidemia   . Dyspnea    with exertion  . Fatty liver   . GERD (gastroesophageal reflux disease)    hiatial hernia  . Heart murmur   . Hepatomegaly   . Hiatal hernia   . HTN (hypertension)   . Hyperlipidemia   . Hypertension   . Irritable bowel syndrome   . Melanosis coli   . Metatarsal fracture right  . Normal nuclear stress test    nml 01/03/10;06/19/07  . Other, mixed, or unspecified nondependent drug abuse, unspecified   . Pancreatitis, chronic ([place])   . Peripheral vascular disease ([place]) 02/20/10   s/p stent of left SFA  . Seizure ([place]) 11/04/2018   last 1 yr 2020 per pt unsure month   . Sleep apnea    no cpap   . Stroke ([place])   .  Thyroid nodule   . Unspecified essential hypertension   . Vitamin B12 deficiency     SURGICAL HISTORY: Past Surgical History:  Procedure Laterality Date  . ABDOMINAL HYSTERECTOMY     partial  . AUGMENTATION MAMMAPLASTY     bilateral 1976 retro   . BREAST ENHANCEMENT SURGERY    . CARDIAC CATHETERIZATION  07/07/98;08/31/01;03/04/05   60 -70% LAD  . CATARACT EXTRACTION    . COLON RESECTION N/A 10/14/2017   Procedure: EXPLORATORY LAPAROTOMY, EXTENDED RIGHT COLECTOMY; APPLICATION OF ABDOMINAL VACUUM DRESSING;  Surgeon: Jovita Kussmaul, MD;  Location: [facility];  Service: General;  Laterality: N/A;  . COLONOSCOPY    . ENDOBRONCHIAL ULTRASOUND Bilateral 02/24/2017   Procedure: ENDOBRONCHIAL ULTRASOUND;  Surgeon: Collene Gobble, MD;  Location: [facility];  Service: Cardiopulmonary;  Laterality: Bilateral;  . EYE SURGERY Left Nov. 2016   Cataract  . EYE SURGERY Right 2001   Cataract  . FEMORAL ARTERY STENT     Left leg  . LAPAROTOMY N/A 10/16/2017   Procedure: EXPLORATORY LAPAROTOMY, PARTIAL OMENTECTOMY, RESECTION ISCHEMIC ILEUM 962XB, APPLICATION OF VAC ABDOMINAL DRESSING;  Surgeon: Armandina Gemma, MD;  Location: [facility];  Service: General;  Laterality: N/A;  . LAPAROTOMY N/A 10/18/2017   Procedure: RE-EXPLORATION OF ABDOMEN, ILEOSTOMY CREATION;  Surgeon: Stark Klein, MD;  Location: [facility];  Service: General;  Laterality: N/A;  . LUNG SURGERY  03/2017   Benign polyps removed  . PATELLA REALIGNMENT Left   . POLYPECTOMY    . SIGMOIDOSCOPY    . TONSILLECTOMY    . TOTAL ABDOMINAL HYSTERECTOMY    . WRIST SURGERY     right    SOCIAL HISTORY: Social History   Socioeconomic History  . Marital status: Divorced    Spouse name: Not on file  . Number of children: 2  . Years of education: Not on file  . Highest education level: Not on file  Occupational History  . Occupation: retired    Fish farm manager: DISABLED  Tobacco Use  . Smoking status: Former Smoker    Packs/day: 1.00    Years: 50.00     Pack years: 50.00    Types: Cigarettes    Quit date: 10/14/2010    Years since quitting: 10.4  . Smokeless tobacco: Never Used  Vaping Use  . Vaping Use: Never used  Substance and Sexual Activity  . Alcohol use: No  . Drug use: No  . Sexual activity: Not on file  Other Topics Concern  . Not on file  Social History Narrative  . Not on file   Social Determinants of Health   Financial Resource Strain: Not on file  Food Insecurity:  Not on file  Transportation Needs: Not on file  Physical Activity: Not on file  Stress: Not on file  Social Connections: Not on file  Intimate Partner Violence: Not on file    FAMILY HISTORY: Family History  Problem Relation Age of Onset  . Hypertension Mother   . Osteoarthritis Mother   . Pulmonary embolism Mother   . Hypertension Father        aneurysm  . Stroke Father   . Stroke Other   . Colon cancer Paternal Grandfather   . Breast cancer Neg Hx   . Colon polyps Neg Hx   . Esophageal cancer Neg Hx   . Rectal cancer Neg Hx   . Stomach cancer Neg Hx     ALLERGIES:  is allergic to adhesive [tape], other, ace inhibitors, sulfa antibiotics, topiramate, codeine, and iodinated diagnostic agents.  MEDICATIONS:  Current Outpatient Medications  Medication Sig Dispense Refill  . acetaminophen (TYLENOL) 325 MG tablet Take 650 mg by mouth every 6 (six) hours as needed for headache (pain). (Patient not taking: Reported on 03/16/2021)    . amLODipine (NORVASC) 10 MG tablet Take 10 mg by mouth daily.    Marland Kitchen atorvastatin (LIPITOR) 20 MG tablet Take 20 mg by mouth daily.    . clopidogrel (PLAVIX) 75 MG tablet Take 75 mg by mouth daily.    . cyclobenzaprine (FLEXERIL) 10 MG tablet Take 0.5 tablets (5 mg total) by mouth 3 (three) times daily as needed for muscle spasms. 30 tablet 0  . diphenoxylate-atropine (LOMOTIL) 2.5-0.025 MG tablet Take 1 tablet by mouth 3 (three) times daily. 90 tablet 1  . ferrous sulfate 220 (44 Fe) MG/5ML solution Take 5 mLs (220 mg  total) by mouth 2 (two) times daily with a meal. 150 mL 3  . levETIRAcetam (KEPPRA) 500 MG tablet TAKE 1 TABLET BY MOUTH TWICE A DAY 180 tablet 2  . loperamide (IMODIUM) 2 MG capsule Take 2 mg by mouth daily as needed for diarrhea or loose stools.     . magnesium oxide (MAG-OX) 400 MG tablet Take 1 tablet by mouth daily.    . metoprolol tartrate (LOPRESSOR) 100 MG tablet Take 1 tablet (100 mg total) by mouth 2 (two) times daily. 60 tablet 0  . montelukast (SINGULAIR) 10 MG tablet Take 10 mg by mouth daily.    . nitroGLYCERIN (NITROSTAT) 0.4 MG SL tablet Place 1 tablet (0.4 mg total) under the tongue every 5 (five) minutes as needed for chest pain. 90 tablet 3  . ondansetron (ZOFRAN-ODT) 4 MG disintegrating tablet Dissolve 1 tablet on tongue twice a day for 7 days then decrease to 1 tablet every 6-8 hours as needed. 30 tablet 0  . Pancrelipase, Lip-Prot-Amyl, (ZENPEP) 40000-126000 units CPEP Take 2 capsules by mouth 3 (three) times daily with meals. Take 2 capsules three times a day with meals for one week and assess (Patient taking differently: Take 1 capsule by mouth daily.) 42 capsule 0  . pantoprazole (PROTONIX) 40 MG tablet Take 1 tablet (40 mg total) by mouth 2 (two) times daily. 180 tablet 3  . SODIUM FLUORIDE 5000 PPM 1.1 % PSTE SMARTSIG:Sparingly By Mouth Twice Daily    . traMADol (ULTRAM) 50 MG tablet Take 50 mg by mouth 3 (three) times daily as needed for moderate pain.    Marland Kitchen venlafaxine XR (EFFEXOR-XR) 75 MG 24 hr capsule Take 1 capsule (75 mg total) by mouth daily.    . vitamin B-12 (CYANOCOBALAMIN) 500 MCG tablet Take 500 mcg  by mouth daily.    . Vitamin D, Ergocalciferol, (DRISDOL) 1.25 MG (50000 UT) CAPS capsule Take 50,000 Units by mouth every Wednesday.      No current facility-administered medications for this visit.    REVIEW OF SYSTEMS:   Constitutional: ( - ) fevers, ( - )  chills , ( - ) night sweats Eyes: ( - ) blurriness of vision, ( - ) double vision, ( - ) watery  eyes Ears, nose, mouth, throat, and face: ( - ) mucositis, ( - ) sore throat Respiratory: ( - ) cough, ( - ) dyspnea, ( - ) wheezes Cardiovascular: ( - ) palpitation, ( - ) chest discomfort, ( - ) lower extremity swelling Gastrointestinal:  ( - ) nausea, ( - ) heartburn, ( - ) change in bowel habits Skin: ( - ) abnormal skin rashes Lymphatics: ( - ) new lymphadenopathy, ( - ) easy bruising Neurological: ( - ) numbness, ( - ) tingling, ( - ) new weaknesses Behavioral/Psych: ( - ) mood change, ( - ) new changes  All other systems were reviewed with the patient and are negative.  PHYSICAL EXAMINATION: ECOG PERFORMANCE STATUS: 2 - Symptomatic, <50% confined to bed  Vitals:   03/16/21 0833  BP: 130/78  Pulse: (!) 110  Resp: 18  Temp: 97.7 F (36.5 C)  SpO2: 99%   Filed Weights   03/16/21 0833  Weight: 170 lb 14.4 oz (77.5 kg)    GENERAL: chronically ill appearing elderly Caucasian female in NAD  SKIN: skin color, texture, turgor are normal, no rashes or significant lesions EYES: conjunctiva are pink and non-injected, sclera clear LUNGS: clear to auscultation and percussion with normal breathing effort HEART: regular rate & rhythm and no murmurs and no lower extremity edema Musculoskeletal: no cyanosis of digits and no clubbing  PSYCH: alert & oriented x 3, fluent speech NEURO: no focal motor/sensory deficits  LABORATORY DATA:  I have reviewed the data as listed CBC Latest Ref Rng & Units 03/06/2021 02/27/2021 12/22/2020  WBC 4.0 - 10.5 K/uL 10.1 9.0 10.1  Hemoglobin 12.0 - 15.0 g/dL 8.8 Repeated and verified X2.(L) 8.7 Repeated and verified X2.(L) 9.6(L)  Hematocrit 36.0 - 46.0 % 27.4(L) 26.7 Repeated and verified X2.(L) 29.5(L)  Platelets 150.0 - 400.0 K/uL 285.0 297.0 419.0(H)    CMP Latest Ref Rng & Units 11/28/2020 11/23/2020 11/22/2020  Glucose 70 - 99 mg/dL 89 96 101(H)  BUN 6 - 23 mg/dL 9 13 17   Creatinine 0.40 - 1.20 mg/dL 1.70(H) 1.66(H) 1.88(H)  Sodium 135 - 145 mEq/L  129(L) 129(L) 129(L)  Potassium 3.5 - 5.1 mEq/L 4.5 4.3 4.5  Chloride 96 - 112 mEq/L 98 104 103  CO2 19 - 32 mEq/L 26 18(L) 20(L)  Calcium 8.4 - 10.5 mg/dL 8.4 7.9(L) 7.7(L)  Total Protein 6.0 - 8.3 g/dL 5.9(L) 4.2(L) 4.1(L)  Total Bilirubin 0.2 - 1.2 mg/dL 0.3 0.4 0.5  Alkaline Phos 39 - 117 U/L 179(H) 129(H) 134(H)  AST 0 - 37 U/L 38(H) 26 28  ALT 0 - 35 U/L 33 20 20    RADIOGRAPHIC STUDIES: I have personally reviewed the radiological images as listed and agreed with the findings in the report. No results found.  ASSESSMENT & PLAN [name] 75 y.o. female with medical history significant for PVD, carotid artery disease, depression, GERD, HTN, DM type II, IBS, sleep apnea not on CPAP, CKD, NAFLD, CVA, COPD and chronic pancreatitis who presents for evaluation of iron deficiency anemia.  After  review of the labs, review of the records, and discussion with the patient the patients findings are most consistent with multifactorial anemia.  The etiology is likely iron deficiency, chronic disease, poor absorption, and nutritional intake.  Given these findings I do recommend that the patient receive IV iron therapy, however it is too late in order to boost her counts any meaningful level prior to her surgery with [facility].  As such I am making recommendations to the [facility] surgical team in order to help prepare her for blood loss due to the surgery, if they should choose to proceed with her hemoglobin as low as it is.  # Iron Deficiency Anemia of Unclear Etiology --the etiology of this patient's iron deficiency anemia is likely multifactorial including anemia of chronic disease, poor absorption of iron, and GI bleeding. -- Unfortunately this visit is to late in order to provide IV iron therapy prior to her surgery.  We have made recommendations below regarding management of her iron deficiency in and around the time of her surgery. -- Plan to have the patient return in July to assess her iron status.   Is likely the patient will need additional rounds of IV iron in order to adequately boost her iron stores  #Preoperative Anemia --if [facility] surgery chooses to proceed with her revision surgery we would recommend CBC prior to her procedure with type and cross to have blood on standby.  --post operatively would recommend administering IV feraheme 510mg  while she is in the hospital  --recommend transfusion for Hgb <7.0 or <8.0 if symptomatic  --we will plan to see the patient back following the surgery to assure her iron stores are correcting.   No orders of the defined types were placed in this encounter.   All questions were answered. The patient knows to call the clinic with any problems, questions or concerns.  A total of more than 60 minutes were spent on this encounter with face-to-face time and non-face-to-face time, including preparing to see the patient, ordering tests and/or medications, counseling the patient and coordination of care as outlined above.   Ledell Peoples, MD Department of Hematology/Oncology [place] [facility] Phone: [phone] Pager: [phone] Email: [email or web address]  03/18/2021 5:31 PM

## 2021-03-16 ENCOUNTER — Inpatient Hospital Stay: Payer: 59

## 2021-03-16 ENCOUNTER — Encounter: Payer: Self-pay | Admitting: Hematology and Oncology

## 2021-03-16 ENCOUNTER — Inpatient Hospital Stay: Payer: 59 | Attending: Hematology and Oncology | Admitting: Hematology and Oncology

## 2021-03-16 ENCOUNTER — Other Ambulatory Visit: Payer: Self-pay

## 2021-03-16 VITALS — BP 130/78 | HR 110 | Temp 97.7°F | Resp 18 | Ht 63.0 in | Wt 170.9 lb

## 2021-03-16 DIAGNOSIS — Z8673 Personal history of transient ischemic attack (TIA), and cerebral infarction without residual deficits: Secondary | ICD-10-CM | POA: Diagnosis not present

## 2021-03-16 DIAGNOSIS — K219 Gastro-esophageal reflux disease without esophagitis: Secondary | ICD-10-CM | POA: Diagnosis not present

## 2021-03-16 DIAGNOSIS — Z79899 Other long term (current) drug therapy: Secondary | ICD-10-CM | POA: Diagnosis not present

## 2021-03-16 DIAGNOSIS — I739 Peripheral vascular disease, unspecified: Secondary | ICD-10-CM | POA: Insufficient documentation

## 2021-03-16 DIAGNOSIS — G4730 Sleep apnea, unspecified: Secondary | ICD-10-CM | POA: Diagnosis not present

## 2021-03-16 DIAGNOSIS — E785 Hyperlipidemia, unspecified: Secondary | ICD-10-CM | POA: Insufficient documentation

## 2021-03-16 DIAGNOSIS — E1151 Type 2 diabetes mellitus with diabetic peripheral angiopathy without gangrene: Secondary | ICD-10-CM | POA: Diagnosis not present

## 2021-03-16 DIAGNOSIS — D500 Iron deficiency anemia secondary to blood loss (chronic): Secondary | ICD-10-CM

## 2021-03-16 DIAGNOSIS — I10 Essential (primary) hypertension: Secondary | ICD-10-CM | POA: Insufficient documentation

## 2021-03-16 DIAGNOSIS — N183 Chronic kidney disease, stage 3 unspecified: Secondary | ICD-10-CM | POA: Diagnosis not present

## 2021-03-16 DIAGNOSIS — Z87891 Personal history of nicotine dependence: Secondary | ICD-10-CM | POA: Diagnosis not present

## 2021-03-16 DIAGNOSIS — R0602 Shortness of breath: Secondary | ICD-10-CM | POA: Diagnosis not present

## 2021-03-16 DIAGNOSIS — D509 Iron deficiency anemia, unspecified: Secondary | ICD-10-CM | POA: Insufficient documentation

## 2021-03-16 DIAGNOSIS — E1136 Type 2 diabetes mellitus with diabetic cataract: Secondary | ICD-10-CM | POA: Insufficient documentation

## 2021-03-16 DIAGNOSIS — I2510 Atherosclerotic heart disease of native coronary artery without angina pectoris: Secondary | ICD-10-CM | POA: Insufficient documentation

## 2021-03-16 DIAGNOSIS — F329 Major depressive disorder, single episode, unspecified: Secondary | ICD-10-CM | POA: Diagnosis not present

## 2021-03-16 DIAGNOSIS — K760 Fatty (change of) liver, not elsewhere classified: Secondary | ICD-10-CM | POA: Diagnosis not present

## 2021-03-16 DIAGNOSIS — K589 Irritable bowel syndrome without diarrhea: Secondary | ICD-10-CM | POA: Diagnosis not present

## 2021-03-18 ENCOUNTER — Encounter: Payer: Self-pay | Admitting: Nurse Practitioner

## 2021-03-18 ENCOUNTER — Encounter: Payer: Self-pay | Admitting: Hematology and Oncology

## 2021-03-19 ENCOUNTER — Telehealth: Payer: Self-pay | Admitting: Hematology and Oncology

## 2021-03-19 NOTE — Telephone Encounter (Signed)
Scheduled appointment per 06/06 sch msg. Patient is aware. 

## 2021-03-21 NOTE — Telephone Encounter (Signed)
Beth, can you add a referral for the iron infusion for our infusion center??

## 2021-03-21 NOTE — Telephone Encounter (Signed)
Hello, just wanted to f/u about the [facility] infusion. I have not seen a referral yet and wanted to f/u.  Thanks-Kim

## 2021-03-22 NOTE — Telephone Encounter (Signed)
Auth Submission: NO AUTH NEEDED Payer: UHC/MEDICAID Medication & CPT/J Code(s) submitted: Feraheme (ferumoxytol) [id number] Route of submission (phone, fax, portal): PHONE Auth type: Buy/Bill Units/visits requested:2 Reference number: [id number]

## 2021-04-05 ENCOUNTER — Other Ambulatory Visit: Payer: Self-pay

## 2021-04-05 ENCOUNTER — Emergency Department (HOSPITAL_COMMUNITY)
Admission: EM | Admit: 2021-04-05 | Discharge: 2021-04-05 | Disposition: A | Discharge status: Home / Self Care | Payer: 59 | Attending: Emergency Medicine | Admitting: Emergency Medicine

## 2021-04-05 ENCOUNTER — Encounter (HOSPITAL_COMMUNITY): Payer: Self-pay

## 2021-04-05 DIAGNOSIS — Z87891 Personal history of nicotine dependence: Secondary | ICD-10-CM | POA: Insufficient documentation

## 2021-04-05 DIAGNOSIS — Z7902 Long term (current) use of antithrombotics/antiplatelets: Secondary | ICD-10-CM | POA: Diagnosis not present

## 2021-04-05 DIAGNOSIS — N183 Chronic kidney disease, stage 3 unspecified: Secondary | ICD-10-CM | POA: Diagnosis not present

## 2021-04-05 DIAGNOSIS — I2510 Atherosclerotic heart disease of native coronary artery without angina pectoris: Secondary | ICD-10-CM | POA: Diagnosis not present

## 2021-04-05 DIAGNOSIS — E1159 Type 2 diabetes mellitus with other circulatory complications: Secondary | ICD-10-CM | POA: Insufficient documentation

## 2021-04-05 DIAGNOSIS — Y738 Miscellaneous gastroenterology and urology devices associated with adverse incidents, not elsewhere classified: Secondary | ICD-10-CM | POA: Insufficient documentation

## 2021-04-05 DIAGNOSIS — T8149XA Infection following a procedure, other surgical site, initial encounter: Secondary | ICD-10-CM | POA: Insufficient documentation

## 2021-04-05 DIAGNOSIS — J449 Chronic obstructive pulmonary disease, unspecified: Secondary | ICD-10-CM | POA: Insufficient documentation

## 2021-04-05 DIAGNOSIS — R630 Anorexia: Secondary | ICD-10-CM | POA: Diagnosis not present

## 2021-04-05 DIAGNOSIS — D631 Anemia in chronic kidney disease: Secondary | ICD-10-CM | POA: Diagnosis not present

## 2021-04-05 DIAGNOSIS — J45909 Unspecified asthma, uncomplicated: Secondary | ICD-10-CM | POA: Insufficient documentation

## 2021-04-05 DIAGNOSIS — I129 Hypertensive chronic kidney disease with stage 1 through stage 4 chronic kidney disease, or unspecified chronic kidney disease: Secondary | ICD-10-CM | POA: Diagnosis not present

## 2021-04-05 DIAGNOSIS — E1142 Type 2 diabetes mellitus with diabetic polyneuropathy: Secondary | ICD-10-CM | POA: Diagnosis not present

## 2021-04-05 DIAGNOSIS — E1122 Type 2 diabetes mellitus with diabetic chronic kidney disease: Secondary | ICD-10-CM | POA: Diagnosis not present

## 2021-04-05 DIAGNOSIS — Z0000 Encounter for general adult medical examination without abnormal findings: Secondary | ICD-10-CM

## 2021-04-05 DIAGNOSIS — Z79899 Other long term (current) drug therapy: Secondary | ICD-10-CM | POA: Diagnosis not present

## 2021-04-05 LAB — CBC WITH DIFFERENTIAL/PLATELET
Abs Immature Granulocytes: 0.09 10*3/uL — ABNORMAL HIGH (ref 0.00–0.07)
Basophils Absolute: 0 10*3/uL (ref 0.0–0.1)
Basophils Relative: 0 %
Eosinophils Absolute: 0.4 10*3/uL (ref 0.0–0.5)
Eosinophils Relative: 4 %
HCT: 27.5 % — ABNORMAL LOW (ref 36.0–46.0)
Hemoglobin: 8.2 g/dL — ABNORMAL LOW (ref 12.0–15.0)
Immature Granulocytes: 1 %
Lymphocytes Relative: 14 %
Lymphs Abs: 1.4 10*3/uL (ref 0.7–4.0)
MCH: 25.4 pg — ABNORMAL LOW (ref 26.0–34.0)
MCHC: 29.8 g/dL — ABNORMAL LOW (ref 30.0–36.0)
MCV: 85.1 fL (ref 80.0–100.0)
Monocytes Absolute: 0.8 10*3/uL (ref 0.1–1.0)
Monocytes Relative: 8 %
Neutro Abs: 7.1 10*3/uL (ref 1.7–7.7)
Neutrophils Relative %: 73 %
Platelets: 531 10*3/uL — ABNORMAL HIGH (ref 150–400)
RBC: 3.23 MIL/uL — ABNORMAL LOW (ref 3.87–5.11)
RDW: 18.3 % — ABNORMAL HIGH (ref 11.5–15.5)
WBC: 9.8 10*3/uL (ref 4.0–10.5)
nRBC: 0 % (ref 0.0–0.2)

## 2021-04-05 LAB — COMPREHENSIVE METABOLIC PANEL
ALT: 20 U/L (ref 0–44)
AST: 33 U/L (ref 15–41)
Albumin: 3.2 g/dL — ABNORMAL LOW (ref 3.5–5.0)
Alkaline Phosphatase: 156 U/L — ABNORMAL HIGH (ref 38–126)
Anion gap: 9 (ref 5–15)
BUN: 10 mg/dL (ref 8–23)
CO2: 28 mmol/L (ref 22–32)
Calcium: 8.9 mg/dL (ref 8.9–10.3)
Chloride: 100 mmol/L (ref 98–111)
Creatinine, Ser: 1.59 mg/dL — ABNORMAL HIGH (ref 0.44–1.00)
GFR, Estimated: 34 mL/min — ABNORMAL LOW (ref >60)
Glucose, Bld: 110 mg/dL — ABNORMAL HIGH (ref 70–99)
Potassium: 3.7 mmol/L (ref 3.5–5.1)
Sodium: 137 mmol/L (ref 135–145)
Total Bilirubin: 0.5 mg/dL (ref 0.3–1.2)
Total Protein: 7.2 g/dL (ref 6.5–8.1)

## 2021-04-05 LAB — URINALYSIS, ROUTINE W REFLEX MICROSCOPIC
Bilirubin Urine: NEGATIVE
Glucose, UA: NEGATIVE mg/dL
Ketones, ur: NEGATIVE mg/dL
Nitrite: NEGATIVE
Protein, ur: NEGATIVE mg/dL
Specific Gravity, Urine: 1.006 (ref 1.005–1.030)
pH: 7 (ref 5.0–8.0)

## 2021-04-05 LAB — LIPASE, BLOOD: Lipase: 21 U/L (ref 11–51)

## 2021-04-05 NOTE — ED Provider Notes (Signed)
Patient will be discharged and treated as an outpatient.  Discharge plan and strict return to ED precautions discussed, patient verbalizes understanding and agreement. [place] DEPT Provider Note   CSN: [id number] Arrival date & time: 04/05/21  1300     History Chief Complaint  Patient presents with   Post-op Problem    Infection to incision site.    [name] is a 75 y.o. female.  HPI  75 year old female with past medical history of recent closure of enterostomy with reversal of anastomosis done at [facility] on 03/23/2021 presents the emergency department concern for "infection at incision site".  Patient reports that the staff at the facility had concern for some drainage at the incision site.  Patient denies any acute abdominal pain.  States that she has had a mild decrease in appetite but denies any vomiting or diarrhea.  No documented fever.  She currently denies any chest pain or shortness of breath.  Past Medical History:  Diagnosis Date   Allergy    Anemia    past hx per pt    Anxiety    Arthritis    back    Arthropathy, unspecified, site unspecified    Blind loop syndrome    Carotid artery disease ([place])    documented occlusion of left ICA   Cataract    removed both eyes    Clotting disorder (HCC)    Hx Clot in leg per pt    Colostomy present [facility])    Coronary artery disease    a. known 60-70% mid-LAD stenosis with caths in 1999, 2002, 2006, and 2012 showing stable anatomy b. 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[place])    Diabetes mellitus    Diverticulosis    Dizziness    chronic   Dyslipidemia    Dyspnea    with exertion   Fatty liver    GERD (gastroesophageal reflux disease)    hiatial hernia   Heart murmur    Hepatomegaly    Hiatal hernia    HTN (hypertension)    Hyperlipidemia    Hypertension    Irritable bowel syndrome    Melanosis coli    Metatarsal fracture right   Normal nuclear stress  test    nml 01/03/10;06/19/07   Other, mixed, or unspecified nondependent drug abuse, unspecified    Pancreatitis, chronic ([place])    Peripheral vascular disease ([place]) 02/20/10   s/p stent of left SFA   Seizure ([place]) 11/04/2018   last 1 yr 2020 per pt unsure month    Sleep apnea    no cpap    Stroke [facility])    Thyroid nodule    Unspecified essential hypertension    Vitamin B12 deficiency     Patient Active Problem List   Diagnosis Date Noted   Hyponatremia 38/88/2800   Metabolic acidosis, NAG, failure of bicarbonate regeneration 11/16/2020   Nausea & vomiting 11/16/2020   Acute-on-chronic kidney injury ([place]) 11/16/2020   Hyperkalemia 10/16/2019   Bilateral pneumonia 34/91/7915   Acute metabolic encephalopathy 05/69/7948   Seizures ([place]) 01/07/2019   Cerebrovascular accident (CVA) due to occlusion of left carotid artery ([place]) 01/07/2019   CAD, multiple vessel 01/07/2019   H/O ischemic bowel disease 01/07/2019   Acute bronchitis 12/04/2018   Oral phase dysphagia 12/04/2018   CKD (chronic kidney disease) stage 3, GFR 30-59 ml/min 11/04/2018   Seizure ([place]) 11/03/2018   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  Ileostomy 10/18/2017 10/14/2017   Ischemic colitis ([place]) 10/14/2017   Acute respiratory failure with hypoxemia (HCC) 10/14/2017   Acute post-operative pain 10/14/2017   Acute kidney injury superimposed on CKD ([place]) 10/14/2017   Anemia 10/14/2017   Nocturia more than twice per night 05/07/2017   Diabetes due to underlying condition w oth circulatory comp ([place]) 05/07/2017   Atherosclerotic PVD with intermittent claudication ([place]) 05/07/2017   Poor sleep hygiene 05/07/2017   Paradoxical insomnia 05/07/2017   Hilar lymphadenopathy    Hemoptysis 02/19/2017   Pulmonary  nodules/lesions, multiple 02/19/2017   Tobacco use 02/19/2017   RUQ abdominal pain 01/13/2017   Left-sided extracranial carotid artery occlusion 11/26/2016   PAOD (peripheral arterial occlusive disease) ([place]) 11/26/2016   Encounter for routine follow-up after surgery of the circulatory system 11/26/2016   Chest pain with moderate risk of acute coronary syndrome 10/27/2015   Acid indigestion 12/31/2013   Pain in left hip 08/26/2013   Screening for gout 05/13/2013   Disorder of skin or subcutaneous tissue 12/22/2012   Difficult or painful urination 09/02/2012   Peripheral blood vessel disorder ([place]) 05/11/2012   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   Follow-up examination, following unspecified surgery 05/11/2012   Occlusion and stenosis of carotid artery without mention of cerebral infarction 04/14/2012   Fatigue 09/24/2011   Gonalgia 07/04/2011   Avitaminosis D 05/02/2011   Chronic pancreatitis ([place]) 04/30/2011   Vitamin B12 deficiency 04/30/2011   Intestinal motility disorder 04/30/2011   Fatty liver 04/30/2011   Adiposity 04/30/2011   Other and unspecified general psychiatric examination 04/26/2011   Abdominal pain 04/09/2011   Iron deficiency anemia, unspecified  04/09/2011   Other general symptoms  04/09/2011   Asthma 04/09/2011   Type 2 diabetes mellitus with diabetic peripheral angiopathy without gangrene ([place]) 01/24/2011   Abnormal weight gain 11/30/2010   Dyslipidemia    Dizziness    Right-sided extracranial carotid artery stenosis    Normal nuclear stress test    Feeling bilious 07/11/2010   Underimmunization status 07/11/2010   Pancreas (digestive gland) works poorly 05/30/2010   B12 deficiency 03/19/2010   DVT 03/16/2010   BLIND LOOP SYNDROME 03/16/2010   Diarrhea 03/16/2010   Chronic depression 02/23/2010   Cough 11/17/2009   Plantar verruca 11/17/2009   Arteriosclerosis of coronary artery 11/16/2009   Claudication ([place]) 11/16/2009    Compulsive tobacco user syndrome 11/16/2009   HLD (hyperlipidemia) 11/16/2009   Nontoxic single thyroid nodule 11/16/2009   Obstructive apnea 11/16/2009   Radial styloid tenosynovitis 11/16/2009   LBP (low back pain) 11/16/2009   LAXATIVE ABUSE 12/17/2007   BP (high blood pressure) 12/17/2007   Acid reflux 12/17/2007   HIATAL HERNIA 12/17/2007   IBS 12/17/2007   MELANOSIS COLI 12/17/2007   ARTHRITIS 12/17/2007   HEPATOMEGALY 12/17/2007    Past Surgical History:  Procedure Laterality Date   ABDOMINAL HYSTERECTOMY     partial   AUGMENTATION MAMMAPLASTY     bilateral 1976 retro    BREAST ENHANCEMENT SURGERY     CARDIAC CATHETERIZATION  07/07/98;08/31/01;03/04/05   60 -70% LAD   CATARACT EXTRACTION     COLON RESECTION N/A 10/14/2017   Procedure: EXPLORATORY LAPAROTOMY, EXTENDED RIGHT COLECTOMY; APPLICATION OF ABDOMINAL VACUUM DRESSING;  Surgeon: Jovita Kussmaul, MD;  Location: [facility] ORS;  Service: General;  Laterality: N/A;   COLONOSCOPY  ENDOBRONCHIAL ULTRASOUND Bilateral 02/24/2017   Procedure: ENDOBRONCHIAL ULTRASOUND;  Surgeon: Collene Gobble, MD;  Location: [facility] ENDOSCOPY;  Service: Cardiopulmonary;  Laterality: Bilateral;   EYE SURGERY Left Nov. 2016   Cataract   EYE SURGERY Right 2001   Cataract   FEMORAL ARTERY STENT     Left leg   LAPAROTOMY N/A 10/16/2017   Procedure: EXPLORATORY LAPAROTOMY, PARTIAL OMENTECTOMY, RESECTION ISCHEMIC ILEUM 720NO, APPLICATION OF VAC ABDOMINAL DRESSING;  Surgeon: Armandina Gemma, MD;  Location: [facility] ORS;  Service: General;  Laterality: N/A;   LAPAROTOMY N/A 10/18/2017   Procedure: RE-EXPLORATION OF ABDOMEN, ILEOSTOMY CREATION;  Surgeon: Stark Klein, MD;  Location: [facility];  Service: General;  Laterality: N/A;   LUNG SURGERY  03/2017   Benign polyps removed   PATELLA REALIGNMENT Left    POLYPECTOMY     SIGMOIDOSCOPY     TONSILLECTOMY     TOTAL ABDOMINAL HYSTERECTOMY     WRIST SURGERY     right     OB History   No obstetric history on file.      Family History  Problem Relation Age of Onset   Hypertension Mother    Osteoarthritis Mother    Pulmonary embolism Mother    Hypertension Father        aneurysm   Stroke Father    Stroke Other    Colon cancer Paternal Grandfather    Breast cancer Neg Hx    Colon polyps Neg Hx    Esophageal cancer Neg Hx    Rectal cancer Neg Hx    Stomach cancer Neg Hx     Social History   Tobacco Use   Smoking status: Former    Packs/day: 1.00    Years: 50.00    Pack years: 50.00    Types: Cigarettes    Quit date: 10/14/2010    Years since quitting: 10.4   Smokeless tobacco: Never  Vaping Use   Vaping Use: Never used  Substance Use Topics   Alcohol use: No   Drug use: No    Home Medications Prior to Admission medications   Medication Sig Start Date End Date Taking? Authorizing Provider  acetaminophen (TYLENOL) 325 MG tablet Take 650 mg by mouth every 6 (six) hours as needed for headache (pain). Patient not taking: Reported on 03/16/2021    [provider]  amLODipine (NORVASC) 10 MG tablet Take 10 mg by mouth daily.    [provider]  atorvastatin (LIPITOR) 20 MG tablet Take 20 mg by mouth daily.    [provider]  clopidogrel (PLAVIX) 75 MG tablet Take 75 mg by mouth daily.    [provider]  cyclobenzaprine (FLEXERIL) 10 MG tablet Take 0.5 tablets (5 mg total) by mouth 3 (three) times daily as needed for muscle spasms. 11/23/20   Elgergawy, Silver Huguenin, MD  diphenoxylate-atropine (LOMOTIL) 2.5-0.025 MG tablet Take 1 tablet by mouth 3 (three) times daily. 08/27/19   Domenic Polite, MD  ferrous sulfate 220 (44 Fe) MG/5ML solution Take 5 mLs (220 mg total) by mouth 2 (two) times daily with a meal. 11/23/20   Elgergawy, Silver Huguenin, MD  levETIRAcetam (KEPPRA) 500 MG tablet TAKE 1 TABLET BY MOUTH TWICE A DAY 01/20/20   Dohmeier, Asencion Partridge, MD  loperamide (IMODIUM) 2 MG capsule Take 2 mg by mouth daily as needed for diarrhea or loose stools.     [provider]  magnesium oxide (MAG-OX) 400 MG tablet Take 1 tablet by mouth daily. 05/08/20   [provider]  metoprolol tartrate (LOPRESSOR) 100 MG tablet Take 1 tablet (100 mg total) by mouth 2 (two) times daily. 11/23/20   Elgergawy, Silver Huguenin, MD  montelukast (SINGULAIR) 10 MG tablet Take 10 mg by mouth daily. 08/09/18   [provider]  nitroGLYCERIN (NITROSTAT) 0.4 MG SL tablet Place 1 tablet (0.4 mg total) under the tongue every 5 (five) minutes as needed for chest pain. 11/08/15   [place], Peter M, MD  ondansetron (ZOFRAN-ODT) 4 MG disintegrating tablet Dissolve 1 tablet on tongue twice a day for 7 days then decrease to 1 tablet every 6-8 hours as needed. 11/28/20   Noralyn Pick, NP  Pancrelipase, Lip-Prot-Amyl, (ZENPEP) 40000-126000 units CPEP Take 2 capsules by mouth 3 (three) times daily with meals. Take 2 capsules three times a day with meals for one week and assess Patient taking differently: Take 1 capsule by mouth daily. 06/27/20   Ladene Artist, MD  pantoprazole (PROTONIX) 40 MG tablet Take 1 tablet (40 mg total) by mouth 2 (two) times daily. 06/27/20   Ladene Artist, MD  SODIUM FLUORIDE 5000 PPM 1.1 % PSTE SMARTSIG:Sparingly By Mouth Twice Daily 05/16/20   [provider]  traMADol (ULTRAM) 50 MG tablet Take 50 mg by mouth 3 (three) times daily as needed for moderate pain. 06/01/20   [provider]  venlafaxine XR (EFFEXOR-XR) 75 MG 24 hr capsule Take 1 capsule (75 mg total) by mouth daily. 05/18/20   [provider]  vitamin B-12 (CYANOCOBALAMIN) 500 MCG tablet Take 500 mcg by mouth daily.    [provider]  Vitamin D, Ergocalciferol, (DRISDOL) 1.25 MG (50000 UT) CAPS capsule Take 50,000 Units by mouth every Wednesday.     [provider]    Allergies    Adhesive [tape], Other, Ace inhibitors, Sulfa antibiotics, Topiramate, Codeine, and Iodinated diagnostic agents  Review of Systems   Review of Systems   Constitutional:  Positive for appetite change and fatigue. Negative for chills and fever.  HENT:  Negative for congestion.   Eyes:  Negative for visual disturbance.  Respiratory:  Negative for shortness of breath.   Cardiovascular:  Negative for chest pain.  Gastrointestinal:  Negative for abdominal pain, diarrhea and vomiting.  Genitourinary:  Negative for dysuria.  Skin:  Negative for rash.       Reported drainage at incision site  Neurological:  Negative for headaches.   Physical Exam Updated Vital Signs BP (!) 170/74   Pulse 91   Temp 98.2 F (36.8 C) (Oral)   Resp 13   Ht 5\' 3"  (1.6 m)   Wt 77.1 kg   SpO2 96%   BMI 30.11 kg/m   Physical Exam Vitals and nursing note reviewed.  Constitutional:      Appearance: Normal appearance. She is not ill-appearing.  HENT:     Head: Normocephalic.     Mouth/Throat:     Mouth: Mucous membranes are moist.  Cardiovascular:     Rate and Rhythm: Normal rate.  Pulmonary:     Effort: Pulmonary effort is normal. No respiratory distress.  Abdominal:     General: There is no distension.     Palpations: Abdomen is soft.     Tenderness: There is no abdominal tenderness.     Comments: Vertical linear incision in the midline abdomen with staples in place, site appears to be hearing well, no surrounding cellulitis, induration or fluctuance, no drainage from the incision site.  The ostomy site appears to be healing  well also, there is small amount of clear fluid at the site with a thin surrounding band of pink tissue that does not appear cellulitic.  Again no surrounding induration or fluctuance, no expressed pustular drainage.  Skin:    General: Skin is warm.  Neurological:     Mental Status: She is alert and oriented to person, place, and time. Mental status is at baseline.  Psychiatric:        Mood and Affect: Mood normal.    ED Results / Procedures / Treatments   Labs (all labs ordered are listed, but only abnormal results are  displayed) Labs Reviewed  CBC WITH DIFFERENTIAL/PLATELET - Abnormal; Notable for the following components:      Result Value   RBC 3.23 (*)    Hemoglobin 8.2 (*)    HCT 27.5 (*)    MCH 25.4 (*)    MCHC 29.8 (*)    RDW 18.3 (*)    Platelets 531 (*)    Abs Immature Granulocytes 0.09 (*)    All other components within normal limits  COMPREHENSIVE METABOLIC PANEL  LIPASE, BLOOD    EKG None  Radiology No results found.  Procedures Procedures   Medications Ordered in ED Medications - No data to display  ED Course  I have reviewed the triage vital signs and the nursing notes.  Pertinent labs & imaging results that were available during my care of the patient were reviewed by me and considered in my medical decision making (see chart for details).    MDM Rules/Calculators/A&P                          75 year old female who is status post closure of enterostomy and reversal of anastomosis presents emergency department with concern for drainage from incision site.  She presents from a facility, concern mainly being from staff.  Patient admits that she feels fatigued with slight decreased appetite but denies any abdominal pain, vomiting/diarrhea or fever.  There are notes from care everywhere from the surgical team of reported decline, nausea/vomiting/diarrhea reported from staff.  Patient does not endorse this.  On arrival she is afebrile, laying in bed, soft benign abdomen.  Incisions appear to be healing well, there is some serous drainage from the ostomy site but no signs of cellulitis/abscess/infection/pustular drainage.    Blood work is all baseline, urinalysis does not show UTI.  Vitals have remained stable.  Patient insists that she feels well.  I do not see any acute findings in regards to incisional infection or postoperative complication.  Patient feels well and will be discharged back to her facility.  Patient will be discharged and treated as an outpatient.  Discharge plan  and strict return to ED precautions discussed, patient verbalizes understanding and agreement.   Final Clinical Impression(s) / ED Diagnoses Final diagnoses:  None    Rx / DC Orders ED Discharge Orders     None        Lorelle Gibbs, DO 04/05/21 1927

## 2021-04-05 NOTE — ED Notes (Signed)
Family is coming to transport pt back to facility

## 2021-04-05 NOTE — Discharge Instructions (Addendum)
You have been seen and discharged from the emergency department.  Your blood work and urine is baseline for you.  I do not appreciate an acute infection involving your surgical site.  Follow-up with your primary provider for reevaluation and further care. Take home medications as prescribed. If you have any worsening symptoms or further concerns for your health please return to an emergency department for further evaluation.

## 2021-04-09 ENCOUNTER — Other Ambulatory Visit: Payer: Self-pay

## 2021-04-09 ENCOUNTER — Ambulatory Visit (INDEPENDENT_AMBULATORY_CARE_PROVIDER_SITE_OTHER): Payer: 59 | Admitting: *Deleted

## 2021-04-09 VITALS — BP 166/79 | HR 90 | Temp 98.3°F | Resp 20

## 2021-04-09 DIAGNOSIS — D500 Iron deficiency anemia secondary to blood loss (chronic): Secondary | ICD-10-CM | POA: Diagnosis not present

## 2021-04-09 MED ORDER — SODIUM CHLORIDE 0.9 % IV SOLN
510.0000 mg | Freq: Once | INTRAVENOUS | Status: AC
  Administered 2021-04-09: 510 mg via INTRAVENOUS
  Filled 2021-04-09 (×2): qty 17

## 2021-04-09 NOTE — Progress Notes (Signed)
Diagnosis: Iron Deficiency Anemia  Provider:  Marshell Garfinkel, MD  Procedure: Infusion  IV Type: Peripheral, IV Location: R Antecubital  Feraheme (Ferumoxytol), Dose: 510 mg  Infusion Start Time: 1203 , 1300  Infusion Stop Time: 4196,2229  Post Infusion IV Care: Observation period completed  Discharge: Condition: Good, Destination: Home . AVS provided to patient.   Performed by:  Oren Beckmann, RN

## 2021-04-17 ENCOUNTER — Ambulatory Visit (INDEPENDENT_AMBULATORY_CARE_PROVIDER_SITE_OTHER): Payer: 59 | Admitting: *Deleted

## 2021-04-17 ENCOUNTER — Other Ambulatory Visit: Payer: Self-pay

## 2021-04-17 VITALS — BP 167/89 | HR 86 | Temp 98.0°F | Resp 18

## 2021-04-17 DIAGNOSIS — D500 Iron deficiency anemia secondary to blood loss (chronic): Secondary | ICD-10-CM

## 2021-04-17 MED ORDER — FAMOTIDINE IN NACL 20-0.9 MG/50ML-% IV SOLN: 20.0000 mg | Freq: Once | INTRAVENOUS | Status: DC | PRN

## 2021-04-17 MED ORDER — SODIUM CHLORIDE 0.9 % IV SOLN: Freq: Once | INTRAVENOUS | Status: DC | PRN

## 2021-04-17 MED ORDER — SODIUM CHLORIDE 0.9% FLUSH: 3.0000 mL | Freq: Once | INTRAVENOUS | Status: DC | PRN

## 2021-04-17 MED ORDER — EPINEPHRINE 0.3 MG/0.3ML IJ SOAJ: 0.3000 mg | Freq: Once | INTRAMUSCULAR | Status: DC | PRN

## 2021-04-17 MED ORDER — SODIUM CHLORIDE 0.9 % IV SOLN
510.0000 mg | Freq: Once | INTRAVENOUS | Status: AC
  Administered 2021-04-17: 510 mg via INTRAVENOUS
  Filled 2021-04-17: qty 17

## 2021-04-17 MED ORDER — METHYLPREDNISOLONE SODIUM SUCC 125 MG IJ SOLR: 125.0000 mg | Freq: Once | INTRAMUSCULAR | Status: DC | PRN

## 2021-04-17 MED ORDER — DIPHENHYDRAMINE HCL 50 MG/ML IJ SOLN: 50.0000 mg | Freq: Once | INTRAMUSCULAR | Status: DC | PRN

## 2021-04-17 MED ORDER — ALBUTEROL SULFATE HFA 108 (90 BASE) MCG/ACT IN AERS: 2.0000 | INHALATION_SPRAY | Freq: Once | RESPIRATORY_TRACT | Status: DC | PRN

## 2021-04-17 NOTE — Progress Notes (Signed)
Diagnosis: Iron Deficiency Anemia  Provider:  Marshell Garfinkel, MD  Procedure: Infusion  IV Type: Peripheral, IV Location: L Antecubital  Feraheme (Ferumoxytol), Dose: 510 mg  Infusion Start Time: 1093  Infusion Stop Time: 0911  Post Infusion IV Care: Observation period completed  Discharge: Condition: Good, Destination: Home . AVS provided to patient.   Performed by:  Oren Beckmann, RN

## 2021-04-18 ENCOUNTER — Other Ambulatory Visit: Payer: Self-pay | Admitting: Hematology and Oncology

## 2021-04-18 ENCOUNTER — Inpatient Hospital Stay: Payer: 59 | Admitting: Hematology and Oncology

## 2021-04-18 ENCOUNTER — Inpatient Hospital Stay: Payer: 59 | Attending: Hematology and Oncology

## 2021-04-18 DIAGNOSIS — D500 Iron deficiency anemia secondary to blood loss (chronic): Secondary | ICD-10-CM

## 2021-04-18 NOTE — Progress Notes (Signed)
No show, rescheduled.

## 2021-04-23 ENCOUNTER — Encounter: Payer: Self-pay | Admitting: Nurse Practitioner

## 2021-04-23 DIAGNOSIS — M5136 Other intervertebral disc degeneration, lumbar region: Secondary | ICD-10-CM | POA: Insufficient documentation

## 2021-04-26 ENCOUNTER — Telehealth: Payer: Self-pay | Admitting: Hematology and Oncology

## 2021-04-26 ENCOUNTER — Other Ambulatory Visit: Payer: Self-pay | Admitting: Pharmacy Technician

## 2021-04-26 NOTE — Telephone Encounter (Signed)
Scheduled appointment per 07/11 sch msg. Left message.

## 2021-05-07 ENCOUNTER — Inpatient Hospital Stay: Payer: 59

## 2021-05-07 ENCOUNTER — Other Ambulatory Visit: Payer: Self-pay | Admitting: Hematology and Oncology

## 2021-05-07 ENCOUNTER — Inpatient Hospital Stay: Payer: 59 | Admitting: Hematology and Oncology

## 2021-05-30 ENCOUNTER — Telehealth: Payer: Self-pay

## 2021-05-30 NOTE — Telephone Encounter (Signed)
-----   Message from Ladene Artist, MD sent at 05/29/2021  6:45 PM EDT ----- Regarding: REV Marvene Staff, MD [facility] colorectal surgeon) called me about my patient who underwent ileostomy reversal a few weeks ago. Recovering well however she had high ostomy output prior to surgery and now has difficult to control diarrhea. C diff recently negative. Cholestyramine is helping.  She needs a REV with me or an APP within 2 weeks or soonest available if outside 2 weeks. She's saw CKS a few months ago. Also keep Dr. Girtha Rm name available when anyone in our group needs an excellent colorectal surgeon who's interested in routine or complex IBD and other routine and complex colorectal cases. I've worked with her on a couple cases and she's really quite good.

## 2021-05-30 NOTE — Telephone Encounter (Signed)
I attempted to contact patient on both of her numbers.  No answer on the either. Her VM is full on her cell and her home phone does not have a VM.  I will continue to try and reach the patient.

## 2021-06-01 NOTE — Telephone Encounter (Signed)
I attempted again to reach the patient.  Phone was answered, could hear television in the background, but nobody every spoke.  I will continue to try and reach the patient.

## 2021-06-06 NOTE — Telephone Encounter (Signed)
Patient has been scheduled for 06/08/21 3:40

## 2021-06-08 ENCOUNTER — Encounter: Payer: Self-pay | Admitting: Gastroenterology

## 2021-06-08 ENCOUNTER — Ambulatory Visit (HOSPITAL_COMMUNITY)
Admission: EM | Admit: 2021-06-08 | Discharge: 2021-06-08 | Disposition: A | Discharge status: Home / Self Care | Payer: 59 | Attending: Medical Oncology | Admitting: Medical Oncology

## 2021-06-08 ENCOUNTER — Encounter (HOSPITAL_COMMUNITY): Payer: Self-pay

## 2021-06-08 ENCOUNTER — Ambulatory Visit (INDEPENDENT_AMBULATORY_CARE_PROVIDER_SITE_OTHER): Payer: 59 | Admitting: Gastroenterology

## 2021-06-08 ENCOUNTER — Ambulatory Visit (INDEPENDENT_AMBULATORY_CARE_PROVIDER_SITE_OTHER): Payer: 59

## 2021-06-08 VITALS — BP 172/78 | HR 80 | Ht 63.0 in | Wt 161.0 lb

## 2021-06-08 DIAGNOSIS — K8689 Other specified diseases of pancreas: Secondary | ICD-10-CM

## 2021-06-08 DIAGNOSIS — R197 Diarrhea, unspecified: Secondary | ICD-10-CM

## 2021-06-08 DIAGNOSIS — M25532 Pain in left wrist: Secondary | ICD-10-CM

## 2021-06-08 DIAGNOSIS — M79642 Pain in left hand: Secondary | ICD-10-CM | POA: Diagnosis not present

## 2021-06-08 DIAGNOSIS — W19XXXA Unspecified fall, initial encounter: Secondary | ICD-10-CM | POA: Diagnosis not present

## 2021-06-08 DIAGNOSIS — Z860101 Personal history of adenomatous and serrated colon polyps: Secondary | ICD-10-CM

## 2021-06-08 DIAGNOSIS — Z86010 Personal history of colonic polyps: Secondary | ICD-10-CM

## 2021-06-08 DIAGNOSIS — K219 Gastro-esophageal reflux disease without esophagitis: Secondary | ICD-10-CM

## 2021-06-08 MED ORDER — ACETAMINOPHEN 325 MG PO TABS
975.0000 mg | ORAL_TABLET | Freq: Once | ORAL | Status: AC
  Administered 2021-06-08: 975 mg via ORAL

## 2021-06-08 MED ORDER — ZENPEP 40000-126000 UNITS PO CPEP: ORAL_CAPSULE | ORAL | 0 refills | Status: DC

## 2021-06-08 MED ORDER — ACETAMINOPHEN 325 MG PO TABS
ORAL_TABLET | ORAL | Status: AC
  Filled 2021-06-08: qty 3

## 2021-06-08 MED ORDER — HYDROCODONE-ACETAMINOPHEN 5-325 MG PO TABS: 1.0000 | ORAL_TABLET | ORAL | 0 refills | Status: AC | PRN

## 2021-06-08 MED ORDER — CHOLESTYRAMINE 4 G PO PACK: 4.0000 g | PACK | Freq: Two times a day (BID) | ORAL | 11 refills | Status: DC

## 2021-06-08 NOTE — Progress Notes (Signed)
9-+*   History of Present Illness: This is a 75 year old female with a history of high ileostomy output who recently underwent takedown of her ileostomy and has had persistent problems with diarrhea.  She is unaccompanied.  On March 23, 2021 she underwent exploratory laparotomy, lysis of adhesions, end ileostomy takedown, small bowel resection, ileocolonic anastomosis and wound revision.  Since surgery she relates having 10 or more loose watery bowel movements per day with occasional small amounts of rectal bleeding.  She states she was started on cholestyramine 1 packet/day which has not improved her symptoms.  She is no longer taking pancreatic enzymes for unclear reasons.  Flexible sigmoidoscopy performed before her surgery as below. Denies weight loss, abdominal pain, constipation, change in stool caliber, melena, nausea, vomiting, dysphagia, reflux symptoms, chest pain.   Flex sigmoidoscopy [facility]/15/2021 at [facility]:     Patient is status-post surgical anastomosis.     The perianal and digital rectal examinations were normal.     The Hartmann pouch appeared normal.     End ileostomy was normal.                            Current Medications, Allergies, Past Medical History, Past Surgical History, Family History and Social History were reviewed in [employer] record.   Physical Exam: General: Well developed, well nourished, no acute distress Head: Normocephalic and atraumatic Eyes: Sclerae anicteric, EOMI Ears: Normal auditory acuity Mouth: Not examined, mask on during Covid-19 pandemic Lungs: Clear throughout to auscultation Heart: Regular rate and rhythm; no murmurs, rubs or bruits Abdomen: Soft, non tender and non distended. No masses, hepatosplenomegaly or hernias noted. Normal Bowel sounds Rectal: Not done  Musculoskeletal: Symmetrical with no gross deformities  Pulses:  Normal pulses noted Extremities: No clubbing, cyanosis, edema or deformities noted Neurological:  Alert oriented x 4, grossly nonfocal Psychological:  Alert and cooperative. Normal mood and affect   Assessment and Recommendations:  Diarrhea likely due to pancreatic insufficiency.  Rule out infectious etiologies.  C. difficile on August 16 at [facility] was negative.  Resume Zenpep 40,000 units 2 before meals, 1 before snacks.  Follow fat modified, carb modified diet long-term.  If diarrhea not well controlled add cholestyramine 4 g p.o. twice daily. REV in 1 month.  S/P end ileostomy takedown, small bowel resection, ileocolonic anastomosis in June 2022 at [facility].  IDA.  Managed by hematology, Dr. Lorenso Courier. GERD, moderate sized hiatal hernia.  Continue to follow antireflux measures and continue pantoprazole 40 mg daily. Personal history of adenomatous colon polyps.  Flexible sigmoidoscopy in June 2022 without polyps.  Given age and comorbidities no plans for future surveillance colonoscopies.

## 2021-06-08 NOTE — ED Triage Notes (Signed)
Pt presents with an injury to the left wrist and hand. Pt states she fell last night going into the door of her home. Presents with swelling.

## 2021-06-08 NOTE — Patient Instructions (Addendum)
If you are age 74 or older, your body mass index should be between 23-30. Your Body mass index is 28.52 kg/m. If this is out of the aforementioned range listed, please consider follow up with your Primary Care Provider.  If you are age 5 or younger, your body mass index should be between 19-25. Your Body mass index is 28.52 kg/m. If this is out of the aformentioned range listed, please consider follow up with your Primary Care Provider.   __________________________________________________________  The [facility] providers would like to encourage you to use [facility] to communicate with providers for non-urgent requests or questions.  Due to long hold times on the telephone, sending your provider a message by [facility] may be a faster and more efficient way to get a response.  Please allow 48 business hours for a response.  Please remember that this is for non-urgent requests.   Your provider has requested that you go to the basement level for lab work before leaving today. Press "B" on the elevator. The lab is located at the first door on the left as you exit the elevator.   Due to recent changes in healthcare laws, you may see the results of your imaging and laboratory studies on MyChart before your provider has had a chance to review them.  We understand that in some cases there may be results that are confusing or concerning to you. Not all laboratory results come back in the same time frame and the provider may be waiting for multiple results in order to interpret others.  Please give [place] 48 hours in order for your provider to thoroughly review all the results before contacting the office for clarification of your results.   Please follow up on 07-13-2021 at 11am with Amy Brainards.  We have sent the following medications to your pharmacy for you to pick up at your convenience:  Zenpep take 2 capsules with meal and 1 capsule with snacks  Cholestyramine twice daily.  Thank you for entrusting me  with your care and choosing [facility].  Dr Fuller Plan

## 2021-06-08 NOTE — Progress Notes (Signed)
Orthopedic Tech Progress Note Patient Details:  [name] [birth year] [id number]  Ortho Devices Type of Ortho Device: Rad Gutter splint Ortho Device/Splint Location: Left hand Ortho Device/Splint Interventions: Application   Post Interventions Patient Tolerated: Well  [name] 06/08/2021, 1:47 PM

## 2021-06-08 NOTE — ED Provider Notes (Signed)
[facility]    CSN: [id number] Arrival date & time: 06/08/21  1123      History   Chief Complaint Chief Complaint  Patient presents with   Fall   Wrist Injury   Hand Injury    HPI [name] is a 75 y.o. female.   HPI  Wrist Pain: Pt states that last night she fell on her left wrist. She immediately had left wrist pain which is severe and has continued. She denies skin color changes, numbness or tingling of the arm. She has not taken anything for symptoms.   Past Medical History:  Diagnosis Date   Allergy    Anemia    past hx per pt    Anxiety    Arthritis    back    Arthropathy, unspecified, site unspecified    Blind loop syndrome    Carotid artery disease ([place])    documented occlusion of left ICA   Cataract    removed both eyes    Clotting disorder (HCC)    Hx Clot in leg per pt    Colostomy present [facility])    Coronary artery disease    a. known 60-70% mid-LAD stenosis with caths in 1999, 2002, 2006, and 2012 showing stable anatomy b. 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[place])    Diabetes mellitus    Diverticulosis    Dizziness    chronic   Dyslipidemia    Dyspnea    with exertion   Fatty liver    GERD (gastroesophageal reflux disease)    hiatial hernia   Heart murmur    Hepatomegaly    Hiatal hernia    HTN (hypertension)    Hyperlipidemia    Hypertension    Irritable bowel syndrome    Melanosis coli    Metatarsal fracture right   Normal nuclear stress test    nml 01/03/10;06/19/07   Other, mixed, or unspecified nondependent drug abuse, unspecified    Pancreatitis, chronic ([place])    Peripheral vascular disease ([place]) 02/20/10   s/p stent of left SFA   Seizure ([place]) 11/04/2018   last 1 yr 2020 per pt unsure month    Sleep apnea    no cpap    Stroke [facility])    Thyroid nodule    Unspecified essential hypertension    Vitamin B12 deficiency     Patient Active Problem List   Diagnosis Date Noted   Hyponatremia  12/87/8676   Metabolic acidosis, NAG, failure of bicarbonate regeneration 11/16/2020   Nausea & vomiting 11/16/2020   Acute-on-chronic kidney injury ([place]) 11/16/2020   Hyperkalemia 10/16/2019   Bilateral pneumonia 72/06/4708   Acute metabolic encephalopathy 62/83/6629   Seizures ([place]) 01/07/2019   Cerebrovascular accident (CVA) due to occlusion of left carotid artery ([place]) 01/07/2019   CAD, multiple vessel 01/07/2019   H/O ischemic bowel disease 01/07/2019   Acute bronchitis 12/04/2018   Oral phase dysphagia 12/04/2018   CKD (chronic kidney disease) stage 3, GFR 30-59 ml/min 11/04/2018   Seizure ([place]) 11/03/2018   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  Ileostomy 10/18/2017 10/14/2017   Ischemic colitis ([place]) 10/14/2017   Acute respiratory failure with hypoxemia ([place]) 10/14/2017   Acute post-operative pain 10/14/2017   Acute kidney injury superimposed on  CKD ([place]) 10/14/2017   Anemia 10/14/2017   Nocturia more than twice per night 05/07/2017   Diabetes due to underlying condition w oth circulatory comp ([place]) 05/07/2017   Atherosclerotic PVD with intermittent claudication ([place]) 05/07/2017   Poor sleep hygiene 05/07/2017   Paradoxical insomnia 05/07/2017   Hilar lymphadenopathy    Hemoptysis 02/19/2017   Pulmonary nodules/lesions, multiple 02/19/2017   Tobacco use 02/19/2017   RUQ abdominal pain 01/13/2017   Left-sided extracranial carotid artery occlusion 11/26/2016   PAOD (peripheral arterial occlusive disease) ([place]) 11/26/2016   Encounter for routine follow-up after surgery of the circulatory system 11/26/2016   Chest pain with moderate risk of acute coronary syndrome 10/27/2015   Acid indigestion 12/31/2013   Pain in left hip 08/26/2013   Screening for gout 05/13/2013    Disorder of skin or subcutaneous tissue 12/22/2012   Difficult or painful urination 09/02/2012   Peripheral blood vessel disorder ([place]) 05/11/2012   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   Follow-up examination, following unspecified surgery 05/11/2012   Occlusion and stenosis of carotid artery without mention of cerebral infarction 04/14/2012   Fatigue 09/24/2011   Gonalgia 07/04/2011   Avitaminosis D 05/02/2011   Chronic pancreatitis ([place]) 04/30/2011   Vitamin B12 deficiency 04/30/2011   Intestinal motility disorder 04/30/2011   Fatty liver 04/30/2011   Adiposity 04/30/2011   Other and unspecified general psychiatric examination 04/26/2011   Abdominal pain 04/09/2011   Iron deficiency anemia, unspecified  04/09/2011   Other general symptoms  04/09/2011   Asthma 04/09/2011   Type 2 diabetes mellitus with diabetic peripheral angiopathy without gangrene ([place]) 01/24/2011   Abnormal weight gain 11/30/2010   Dyslipidemia    Dizziness    Right-sided extracranial carotid artery stenosis    Normal nuclear stress test    Feeling bilious 07/11/2010   Underimmunization status 07/11/2010   Pancreas (digestive gland) works poorly 05/30/2010   B12 deficiency 03/19/2010   DVT 03/16/2010   BLIND LOOP SYNDROME 03/16/2010   Diarrhea 03/16/2010   Chronic depression 02/23/2010   Cough 11/17/2009   Plantar verruca 11/17/2009   Arteriosclerosis of coronary artery 11/16/2009   Claudication ([place]) 11/16/2009   Compulsive tobacco user syndrome 11/16/2009   HLD (hyperlipidemia) 11/16/2009   Nontoxic single thyroid nodule 11/16/2009   Obstructive apnea 11/16/2009   Radial styloid tenosynovitis 11/16/2009   LBP (low back pain) 11/16/2009   LAXATIVE ABUSE 12/17/2007   BP (high blood pressure) 12/17/2007   Acid reflux 12/17/2007   HIATAL HERNIA 12/17/2007   IBS 12/17/2007   MELANOSIS COLI 12/17/2007   ARTHRITIS 12/17/2007   HEPATOMEGALY 12/17/2007    Past Surgical History:   Procedure Laterality Date   ABDOMINAL HYSTERECTOMY     partial   AUGMENTATION MAMMAPLASTY     bilateral 1976 retro    BREAST ENHANCEMENT SURGERY     CARDIAC CATHETERIZATION  07/07/98;08/31/01;03/04/05   60 -70% LAD   CATARACT EXTRACTION     COLON RESECTION N/A 10/14/2017   Procedure: EXPLORATORY LAPAROTOMY, EXTENDED RIGHT COLECTOMY; APPLICATION OF ABDOMINAL VACUUM DRESSING;  Surgeon: Jovita Kussmaul, MD;  Location: [facility];  Service: General;  Laterality: N/A;   COLONOSCOPY     ENDOBRONCHIAL ULTRASOUND Bilateral 02/24/2017   Procedure: ENDOBRONCHIAL ULTRASOUND;  Surgeon: Collene Gobble, MD;  Location: [facility];  Service: Cardiopulmonary;  Laterality: Bilateral;   EYE SURGERY Left Nov. 2016   Cataract   EYE SURGERY Right 2001   Cataract   FEMORAL ARTERY STENT     Left leg  LAPAROTOMY N/A 10/16/2017   Procedure: EXPLORATORY LAPAROTOMY, PARTIAL OMENTECTOMY, RESECTION ISCHEMIC ILEUM 161WR, APPLICATION OF VAC ABDOMINAL DRESSING;  Surgeon: Armandina Gemma, MD;  Location: [facility];  Service: General;  Laterality: N/A;   LAPAROTOMY N/A 10/18/2017   Procedure: RE-EXPLORATION OF ABDOMEN, ILEOSTOMY CREATION;  Surgeon: Stark Klein, MD;  Location: [facility];  Service: General;  Laterality: N/A;   LUNG SURGERY  03/2017   Benign polyps removed   PATELLA REALIGNMENT Left    POLYPECTOMY     SIGMOIDOSCOPY     TONSILLECTOMY     TOTAL ABDOMINAL HYSTERECTOMY     WRIST SURGERY     right    OB History   No obstetric history on file.      Home Medications    Prior to Admission medications   Medication Sig Start Date End Date Taking? Authorizing Provider  acetaminophen (TYLENOL) 325 MG tablet Take 650 mg by mouth every 6 (six) hours as needed for headache (pain). Patient not taking: Reported on 03/16/2021    [provider]  amLODipine (NORVASC) 10 MG tablet Take 10 mg by mouth daily.    [provider]  atorvastatin (LIPITOR) 20 MG tablet Take 20 mg by mouth daily.    [provider]  clopidogrel (PLAVIX) 75 MG tablet Take 75 mg by mouth daily.    [provider]  cyclobenzaprine (FLEXERIL) 10 MG tablet Take 0.5 tablets (5 mg total) by mouth 3 (three) times daily as needed for muscle spasms. 11/23/20   Elgergawy, Silver Huguenin, MD  diphenoxylate-atropine (LOMOTIL) 2.5-0.025 MG tablet Take 1 tablet by mouth 3 (three) times daily. 08/27/19   Domenic Polite, MD  ferrous sulfate 220 (44 Fe) MG/5ML solution Take 5 mLs (220 mg total) by mouth 2 (two) times daily with a meal. 11/23/20   Elgergawy, Silver Huguenin, MD  levETIRAcetam (KEPPRA) 500 MG tablet TAKE 1 TABLET BY MOUTH TWICE A DAY 01/20/20   Dohmeier, Asencion Partridge, MD  loperamide (IMODIUM) 2 MG capsule Take 2 mg by mouth daily as needed for diarrhea or loose stools.     [provider]  magnesium oxide (MAG-OX) 400 MG tablet Take 1 tablet by mouth daily. 05/08/20   [provider]  metoprolol tartrate (LOPRESSOR) 100 MG tablet Take 1 tablet (100 mg total) by mouth 2 (two) times daily. 11/23/20   Elgergawy, Silver Huguenin, MD  montelukast (SINGULAIR) 10 MG tablet Take 10 mg by mouth daily. 08/09/18   [provider]  nitroGLYCERIN (NITROSTAT) 0.4 MG SL tablet Place 1 tablet (0.4 mg total) under the tongue every 5 (five) minutes as needed for chest pain. 11/08/15   Martinique, Peter M, MD  ondansetron (ZOFRAN-ODT) 4 MG disintegrating tablet Dissolve 1 tablet on tongue twice a day for 7 days then decrease to 1 tablet every 6-8 hours as needed. 11/28/20   Noralyn Pick, NP  Pancrelipase, Lip-Prot-Amyl, (ZENPEP) 40000-126000 units CPEP Take 2 capsules by mouth 3 (three) times daily with meals. Take 2 capsules three times a day with meals for one week and assess Patient taking differently: Take 1 capsule by mouth daily. 06/27/20   Ladene Artist, MD  pantoprazole (PROTONIX) 40 MG tablet Take 1 tablet (40 mg total) by mouth 2 (two) times daily. 06/27/20   Ladene Artist, MD  SODIUM FLUORIDE 5000 PPM 1.1 %  PSTE SMARTSIG:Sparingly By Mouth Twice Daily 05/16/20   [provider]  traMADol (ULTRAM) 50 MG tablet Take 50 mg by mouth 3 (three) times daily as  needed for moderate pain. 06/01/20   [provider]  venlafaxine XR (EFFEXOR-XR) 75 MG 24 hr capsule Take 1 capsule (75 mg total) by mouth daily. 05/18/20   [provider]  vitamin B-12 (CYANOCOBALAMIN) 500 MCG tablet Take 500 mcg by mouth daily.    [provider]  Vitamin D, Ergocalciferol, (DRISDOL) 1.25 MG (50000 UT) CAPS capsule Take 50,000 Units by mouth every Wednesday.     [provider]    Family History Family History  Problem Relation Age of Onset   Hypertension Mother    Osteoarthritis Mother    Pulmonary embolism Mother    Hypertension Father        aneurysm   Stroke Father    Stroke Other    Colon cancer Paternal Grandfather    Breast cancer Neg Hx    Colon polyps Neg Hx    Esophageal cancer Neg Hx    Rectal cancer Neg Hx    Stomach cancer Neg Hx     Social History Social History   Tobacco Use   Smoking status: Former    Packs/day: 1.00    Years: 50.00    Pack years: 50.00    Types: Cigarettes    Quit date: 10/14/2010    Years since quitting: 10.6   Smokeless tobacco: Never  Vaping Use   Vaping Use: Never used  Substance Use Topics   Alcohol use: No   Drug use: No     Allergies   Adhesive [tape], Other, Ace inhibitors, Sulfa antibiotics, Topiramate, Codeine, and Iodinated diagnostic agents   Review of Systems Review of Systems  As stated above in HPI Physical Exam Triage Vital Signs ED Triage Vitals  Enc Vitals Group     BP 06/08/21 1227 (!) 167/103     Pulse Rate 06/08/21 1227 75     Resp 06/08/21 1227 17     Temp 06/08/21 1227 97.9 F (36.6 C)     Temp Source 06/08/21 1227 Oral     SpO2 06/08/21 1227 99 %     Weight --      Height --      Head Circumference --      Peak Flow --      Pain Score 06/08/21 1228 9     Pain Loc --      Pain Edu? --       Excl. in [place]? --    No data found.  Updated Vital Signs BP (!) 167/103 (BP Location: Left Arm)   Pulse 75   Temp 97.9 F (36.6 C) (Oral)   Resp 17   SpO2 99%   Physical Exam Vitals and nursing note reviewed.  Constitutional:      Appearance: Normal appearance.  Cardiovascular:     Pulses: Normal pulses.  Musculoskeletal:        General: Swelling, tenderness, deformity (left wrist) and signs of injury present.     Left lower leg: No edema.     Comments: Moderate edema of the left wrist. Pt is able to move fingers of the left hand. No ROM of the left wrist. No pain of left upper arm.   Skin:    General: Skin is warm.     Capillary Refill: Capillary refill takes 2 to 3 seconds.  Neurological:     Mental Status: She is alert and oriented to person, place, and time.     UC Treatments / Results  Labs (all labs ordered are listed, but only abnormal  results are displayed) Labs Reviewed - No data to display  EKG   Radiology DG Hand Complete Left  Result Date: 06/08/2021 CLINICAL DATA:  Fall.  Pain. EXAM: LEFT HAND - COMPLETE 3+ VIEW COMPARISON:  12/09/2005 FINDINGS: Nondisplaced transverse fracture distal radius. No definite involvement of the wrist joint. No fracture of the ulna. Negative for hand fracture. There is degenerative change of the base of thumb. IMPRESSION: Nondisplaced transverse fracture distal radius. Electronically Signed   By: Franchot Gallo M.D.   On: 06/08/2021 13:21    Procedures Procedures (including critical care time)  Medications Ordered in UC Medications - No data to display  Initial Impression / Assessment and Plan / UC Course  I have reviewed the triage vital signs and the nursing notes.  Pertinent labs & imaging results that were available during my care of the patient were reviewed by me and considered in my medical decision making (see chart for details).     New. X ray pending.  X-ray shows a nondisplaced fracture.  We will splint  patient in office and I will send her home with pain medication.  We discussed her codeine allergy but she states that she has tolerated Norco well in the recent past.  Discussed precautions.  Discussed how to use these medications along with common potential side effects, precautions and blackbox warnings.  She will need to follow-up with orthopedic within 1 week. Increase calcium and vitamin D intake.  Final Clinical Impressions(s) / UC Diagnoses   Final diagnoses:  None   Discharge Instructions   None    ED Prescriptions   None    PDMP not reviewed this encounter.   Hughie Closs, [place] 06/08/21 1334

## 2021-06-15 DIAGNOSIS — S52502A Unspecified fracture of the lower end of left radius, initial encounter for closed fracture: Secondary | ICD-10-CM | POA: Insufficient documentation

## 2021-06-25 ENCOUNTER — Other Ambulatory Visit: Payer: 59

## 2021-06-25 DIAGNOSIS — K8689 Other specified diseases of pancreas: Secondary | ICD-10-CM

## 2021-06-25 DIAGNOSIS — R197 Diarrhea, unspecified: Secondary | ICD-10-CM

## 2021-06-26 ENCOUNTER — Other Ambulatory Visit: Payer: Self-pay | Admitting: Internal Medicine

## 2021-06-26 DIAGNOSIS — Z1231 Encounter for screening mammogram for malignant neoplasm of breast: Secondary | ICD-10-CM

## 2021-06-27 LAB — GI PROFILE, STOOL, PCR

## 2021-07-12 ENCOUNTER — Other Ambulatory Visit: Payer: Self-pay

## 2021-07-12 DIAGNOSIS — I6521 Occlusion and stenosis of right carotid artery: Secondary | ICD-10-CM

## 2021-07-12 DIAGNOSIS — S31109A Unspecified open wound of abdominal wall, unspecified quadrant without penetration into peritoneal cavity, initial encounter: Secondary | ICD-10-CM | POA: Insufficient documentation

## 2021-07-12 DIAGNOSIS — I779 Disorder of arteries and arterioles, unspecified: Secondary | ICD-10-CM

## 2021-07-12 DIAGNOSIS — R1313 Dysphagia, pharyngeal phase: Secondary | ICD-10-CM | POA: Insufficient documentation

## 2021-07-12 DIAGNOSIS — I6522 Occlusion and stenosis of left carotid artery: Secondary | ICD-10-CM

## 2021-07-12 DIAGNOSIS — L89159 Pressure ulcer of sacral region, unspecified stage: Secondary | ICD-10-CM | POA: Insufficient documentation

## 2021-07-13 ENCOUNTER — Ambulatory Visit: Payer: 59 | Admitting: Physician Assistant

## 2021-07-20 ENCOUNTER — Encounter (HOSPITAL_COMMUNITY): Payer: 59

## 2021-07-20 ENCOUNTER — Other Ambulatory Visit (HOSPITAL_COMMUNITY): Payer: 59

## 2021-07-20 ENCOUNTER — Ambulatory Visit: Payer: 59 | Admitting: Vascular Surgery

## 2021-07-20 ENCOUNTER — Inpatient Hospital Stay (HOSPITAL_COMMUNITY): Admission: RE | Admit: 2021-07-20 | Payer: 59 | Source: Ambulatory Visit

## 2021-07-30 ENCOUNTER — Ambulatory Visit: Payer: 59

## 2021-08-10 ENCOUNTER — Ambulatory Visit (INDEPENDENT_AMBULATORY_CARE_PROVIDER_SITE_OTHER): Payer: 59 | Admitting: Nurse Practitioner

## 2021-08-10 ENCOUNTER — Encounter: Payer: Self-pay | Admitting: Nurse Practitioner

## 2021-08-10 ENCOUNTER — Other Ambulatory Visit (INDEPENDENT_AMBULATORY_CARE_PROVIDER_SITE_OTHER): Payer: 59

## 2021-08-10 VITALS — BP 100/70 | HR 76 | Ht 59.5 in | Wt 164.5 lb

## 2021-08-10 DIAGNOSIS — R197 Diarrhea, unspecified: Secondary | ICD-10-CM | POA: Diagnosis not present

## 2021-08-10 LAB — CBC
HCT: 37.8 % (ref 36.0–46.0)
Hemoglobin: 12.3 g/dL (ref 12.0–15.0)
MCHC: 32.5 g/dL (ref 30.0–36.0)
MCV: 93.2 fl (ref 78.0–100.0)
Platelets: 263 10*3/uL (ref 150.0–400.0)
RBC: 4.05 Mil/uL (ref 3.87–5.11)
RDW: 13.7 % (ref 11.5–15.5)
WBC: 9.6 10*3/uL (ref 4.0–10.5)

## 2021-08-10 LAB — BASIC METABOLIC PANEL
BUN: 17 mg/dL (ref 6–23)
CO2: 28 mEq/L (ref 19–32)
Calcium: 9.1 mg/dL (ref 8.4–10.5)
Chloride: 105 mEq/L (ref 96–112)
Creatinine, Ser: 1.64 mg/dL — ABNORMAL HIGH (ref 0.40–1.20)
GFR: 30.72 mL/min — ABNORMAL LOW (ref >60.00)
Glucose, Bld: 80 mg/dL (ref 70–99)
Potassium: 4.4 mEq/L (ref 3.5–5.1)
Sodium: 140 mEq/L (ref 135–145)

## 2021-08-10 NOTE — Progress Notes (Signed)
ASSESSMENT AND PLAN    # 75 yo female who is s/p end ileostomy, takedown, small bowel resection, ileocolonic anastomosis in June 2022 at [facility]. Most recently we have been following her for diarrhea post ileostomy takedown. Having persistent diarrhea after resuming pancreatic enzymes (recommended at her last visit in late August). Incidentially stool studies positive for Norovirus in mid September --Not clear how much cholestyramine she is taking. Dr. Lynne Leader last office note in August said he would increase dose to 4 grams BID IF she didn't get better after restarting Zenpep. Her home med list today shows that she is taking cholestyramine 4 grams BID. Patient feels like she is only taking it once a day.  She will ask Daughter [name] to call office and confirm dose.  Also asked that daughter call with exact dosages of imodium and lomotil in case we need to increase them at some point  --Clinically doesn't appear dehydrated. She is normotensive - 100/70, HR 76. Moist mucous membranes --Check BMP, CBC --Will adjust medications for diarrhea once we hear from her daughter.  --Follow up in 2 months.   # Chronic anemia in setting of extensive illness / prolonged hospitalizations/ major surgeries.  Managed by Hematology, Dr. Lorenso Courier  HISTORY OF PRESENT ILLNESS    Chief Complaint : Here for one month follow up. Tired and still having diarrhea  Patient is known to Dr. Fuller Plan.  She has a complicated GI history. She has iron deficiency anemia managed by Hematology. She has a history of GERD with a moderate size hiatal hernia.  She has a history of adenomatous colon polyps. She is status post end ileostomy, takedown, small bowel resection, ileocolonic anastomosis in June 2022 at [facility] .  Flexible sigmoidoscopy in June 22 without polyps.  Given age and and comorbid conditions future surveillance colonoscopies not planned.   Patient was seen by Dr. Fuller Plan on 06/08/2021 for evaluation of persistent diarrhea after  takedown of her ileostomy.  She was having 10 or more loose bowel movements a day .  She had been started on cholestyramine 4 gms a day which had not improved her symptoms.  For unclear reason she was no longer taking pancreatic enzymes . We felt diarrhea was likely related to pancreatic insufficiency.  Stool studies in August at [facility] were negative. We resumed her pancreatic enzymes,  recommended a fat modified, carb modified diet.  If diarrhea was not controlled then plan was to add cholestyramine at a higher dose.  She was asked to follow-up in the office in 1 month and is here today for follow-up  She is a little better after resuming pancreatitic enzymes but still have 8-10 watery to loose stools a day. Occasionally has a small amount of blood when wiping but feels it is perianal irritation. . She feels tired and concerned about being dehydration. Occasional dizziness but mainly when closing  her eyes. Not having any abdominal pain, nausea / vomiting  PREVIOUS ENDOSCOPIC EVALUATIONS / PERTINENT STUDIES:    Flex sigmoidoscopy 03/15/2021 at [facility]:     Patient is status-post surgical anastomosis.     The perianal and digital rectal examinations were normal.     The Hartmann pouch appeared normal.     End ileostomy was normal.     Past Medical History:  Diagnosis Date   Allergy    Anemia    past hx per pt    Anxiety    Arthritis    back    Arthropathy, unspecified, site  unspecified    Blind loop syndrome    Carotid artery disease (HCC)    documented occlusion of left ICA   Cataract    removed both eyes    Clotting disorder (HCC)    Hx Clot in leg per pt    Colostomy present [facility])    Coronary artery disease    a. known 60-70% mid-LAD stenosis with caths in 1999, 2002, 2006, and 2012 showing stable anatomy b. 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[place])    Diabetes mellitus    Diverticulosis    Dizziness    chronic   Dyslipidemia    Dyspnea    with exertion    Fatty liver    GERD (gastroesophageal reflux disease)    hiatial hernia   Heart murmur    Hepatomegaly    Hiatal hernia    HTN (hypertension)    Hyperlipidemia    Hypertension    Irritable bowel syndrome    Melanosis coli    Metatarsal fracture right   Normal nuclear stress test    nml 01/03/10;06/19/07   Other, mixed, or unspecified nondependent drug abuse, unspecified    Pancreatitis, chronic ([place])    Peripheral vascular disease ([place]) 02/20/10   s/p stent of left SFA   Seizure ([place]) 11/04/2018   last 1 yr 2020 per pt unsure month    Sleep apnea    no cpap    Stroke [facility])    Thyroid nodule    Unspecified essential hypertension    Vitamin B12 deficiency     Current Medications, Allergies, Past Surgical History, Family History and Social History were reviewed in [place] record.   Current Outpatient Medications  Medication Sig Dispense Refill   amLODipine (NORVASC) 10 MG tablet Take 10 mg by mouth daily.     atorvastatin (LIPITOR) 20 MG tablet Take 20 mg by mouth daily.     cholestyramine (QUESTRAN) 4 g packet Take 1 packet (4 g total) by mouth 2 (two) times daily. 60 each 11   clopidogrel (PLAVIX) 75 MG tablet Take 75 mg by mouth daily.     cyclobenzaprine (FLEXERIL) 10 MG tablet Take 0.5 tablets (5 mg total) by mouth 3 (three) times daily as needed for muscle spasms. 30 tablet 0   diphenoxylate-atropine (LOMOTIL) 2.5-0.025 MG tablet Take 1 tablet by mouth 3 (three) times daily. 90 tablet 1   ferrous sulfate 220 (44 Fe) MG/5ML solution Take 5 mLs (220 mg total) by mouth 2 (two) times daily with a meal. 150 mL 3   levETIRAcetam (KEPPRA) 500 MG tablet TAKE 1 TABLET BY MOUTH TWICE A DAY 180 tablet 2   loperamide (IMODIUM) 2 MG capsule Take 2 mg by mouth daily as needed for diarrhea or loose stools.      magnesium oxide (MAG-OX) 400 MG tablet Take 1 tablet by mouth daily.     metoprolol tartrate (LOPRESSOR) 100 MG tablet Take 1 tablet (100 mg total) by  mouth 2 (two) times daily. 60 tablet 0   montelukast (SINGULAIR) 10 MG tablet Take 10 mg by mouth daily.     nitroGLYCERIN (NITROSTAT) 0.4 MG SL tablet Place 1 tablet (0.4 mg total) under the tongue every 5 (five) minutes as needed for chest pain. 90 tablet 3   ondansetron (ZOFRAN-ODT) 4 MG disintegrating tablet Dissolve 1 tablet on tongue twice a day for 7 days then decrease to 1 tablet every 6-8 hours as needed. 30 tablet 0  Pancrelipase, Lip-Prot-Amyl, (ZENPEP) 40000-126000 units CPEP Take 2 capsules with meals and one capsule with snacks. 240 capsule 0   pantoprazole (PROTONIX) 40 MG tablet Take 1 tablet (40 mg total) by mouth 2 (two) times daily. 180 tablet 3   SODIUM FLUORIDE 5000 PPM 1.1 % PSTE SMARTSIG:Sparingly By Mouth Twice Daily     traMADol (ULTRAM) 50 MG tablet Take 50 mg by mouth 3 (three) times daily as needed for moderate pain.     venlafaxine XR (EFFEXOR-XR) 75 MG 24 hr capsule Take 1 capsule (75 mg total) by mouth daily.     vitamin B-12 (CYANOCOBALAMIN) 500 MCG tablet Take 500 mcg by mouth daily.     Vitamin D, Ergocalciferol, (DRISDOL) 1.25 MG (50000 UT) CAPS capsule Take 50,000 Units by mouth every Wednesday.      No current facility-administered medications for this visit.    Review of Systems: No chest pain. No shortness of breath. No urinary complaints.   PHYSICAL EXAM :    Wt Readings from Last 3 Encounters:  08/10/21 164 lb 8 oz (74.6 kg)  06/08/21 161 lb (73 kg)  04/05/21 170 lb (77.1 kg)    Ht 4' 11.5" (1.511 m) Comment: height measured without shoes  Wt 164 lb 8 oz (74.6 kg)   BMI 32.67 kg/m  Constitutional:  female in no acute distress. Psychiatric: Pleasant. Normal mood and affect. Behavior is normal. EENT: Pupils normal.  Conjunctivae are normal. No scleral icterus. Neck supple.  Cardiovascular: Normal rate, regular rhythm. 1+ RLE edema, 2+ LLE edema Pulmonary/chest: Effort normal and breath sounds normal. No wheezing, rales or rhonchi. Abdominal:  Soft, nondistended, nontender. Bowel sounds active throughout. Multiple healed surgical scars. There are no masses palpable.  Neurological: Alert and oriented to person place and time. Skin: Skin is warm and dry. No rashes noted.  Tye Savoy, NP  08/10/2021, 10:17 AM

## 2021-08-10 NOTE — Patient Instructions (Addendum)
If you are age 74 or older, your body mass index should be between 23-30. Your Body mass index is 32.67 kg/m. If this is out of the aforementioned range listed, please consider follow up with your Primary Care Provider.  LABS:  Lab work has been ordered for you today. Our lab is located in the basement. Press "B" on the elevator. The lab is located at the first door on the left as you exit the elevator.  Please have your daughter [name] call us with the exact dose of Imodium and Lomotil. We also need to know if she is taking the Cholestyramine once a day or twice a day. IF she is taking it once a day, please increase to twice a day. Refills were sent back in August.  We have scheduled you a follow up with Dr. Fuller Plan on 09/20/21 at 10:30.  It was great seeing you today! Thank you for entrusting me with your care and choosing [facility].  Tye Savoy, NP

## 2021-08-11 ENCOUNTER — Ambulatory Visit: Payer: 59

## 2021-08-13 NOTE — Progress Notes (Signed)
Reviewed and agree with management plan.  Micaylah Bertucci T. Clarabelle Oscarson, MD FACG 

## 2021-08-23 ENCOUNTER — Ambulatory Visit: Payer: 59 | Admitting: Physician Assistant

## 2021-08-24 ENCOUNTER — Encounter (HOSPITAL_COMMUNITY): Payer: 59

## 2021-08-24 ENCOUNTER — Other Ambulatory Visit (HOSPITAL_COMMUNITY): Payer: 59

## 2021-08-24 ENCOUNTER — Ambulatory Visit: Payer: 59 | Admitting: Vascular Surgery

## 2021-08-31 ENCOUNTER — Encounter (HOSPITAL_COMMUNITY): Payer: 59

## 2021-08-31 ENCOUNTER — Ambulatory Visit: Payer: 59 | Admitting: Vascular Surgery

## 2021-08-31 ENCOUNTER — Other Ambulatory Visit (HOSPITAL_COMMUNITY): Payer: 59

## 2021-09-18 ENCOUNTER — Ambulatory Visit
Admission: RE | Admit: 2021-09-18 | Discharge: 2021-09-18 | Disposition: A | Discharge status: Home / Self Care | Payer: 59 | Source: Ambulatory Visit | Attending: Internal Medicine | Admitting: Internal Medicine

## 2021-09-18 DIAGNOSIS — Z1231 Encounter for screening mammogram for malignant neoplasm of breast: Secondary | ICD-10-CM

## 2021-09-20 ENCOUNTER — Ambulatory Visit: Payer: 59 | Admitting: Gastroenterology

## 2021-09-20 ENCOUNTER — Ambulatory Visit: Payer: 59 | Admitting: Physician Assistant

## 2021-09-27 NOTE — Progress Notes (Signed)
Office Note     CC:  Sten Requesting Provider:  Leanna Battles, MD  HPI: [name] is a 75 y.o. (1946-03-10) female presenting in follow up with history of of left SFA stenting in May 2011.  She has a known left ICA occlusion.    On exam today, [name] was doing well.  Her main concern was bilateral lower extremity edema which is led to significant leg swelling.  He does not have an etiology for this swelling. [name] denies symptoms of claudication, ischemic rest pain, wounds. Ambulation has been limited due to the heaviness of her legs. He also denies symptoms of stroke, TIA, amaurosis.  The pt is  on a statin for cholesterol management.  The pt is  on a daily aspirin.   Other AC:  plavix The pt is  on medication for hypertension.   The pt is not diabetic.  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 known 60-70% mid-LAD stenosis with caths in 1999, 2002, 2006, and 2012 showing stable anatomy b. 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[place])    Diabetes mellitus    Diverticulosis    Dizziness    chronic   Dyslipidemia    Dyspnea    with exertion   Fatty liver    GERD (gastroesophageal reflux disease)    hiatial hernia   Heart murmur    Hepatomegaly    Hiatal hernia    HTN (hypertension)    Hyperlipidemia    Hypertension    Irritable bowel syndrome    Melanosis coli    Metatarsal fracture right   Normal nuclear stress test    nml 01/03/10;06/19/07   Other, mixed, or unspecified nondependent drug abuse, unspecified    Pancreatitis, chronic ([place])    Peripheral vascular disease  ([place]) 02/20/10   s/p stent of left SFA   Seizure ([place]) 11/04/2018   last 1 yr 2020 per pt unsure month    Sleep apnea    no cpap    Stroke ([place])    Thyroid nodule    Unspecified essential hypertension    Vitamin B12 deficiency     Past Surgical History:  Procedure Laterality Date   ABDOMINAL HYSTERECTOMY     partial   AUGMENTATION MAMMAPLASTY     bilateral 1976 retro    BREAST ENHANCEMENT SURGERY     CARDIAC CATHETERIZATION  07/07/98;08/31/01;03/04/05   60 -70% LAD   CATARACT EXTRACTION Bilateral    COLON RESECTION N/A 10/14/2017   Procedure: EXPLORATORY LAPAROTOMY, EXTENDED RIGHT COLECTOMY; APPLICATION OF ABDOMINAL VACUUM DRESSING;  Surgeon: Jovita Kussmaul, MD;  Location: [facility];  Service: General;  Laterality: N/A;   COLONOSCOPY     ENDOBRONCHIAL ULTRASOUND Bilateral 02/24/2017   Procedure: ENDOBRONCHIAL ULTRASOUND;  Surgeon: Collene Gobble, MD;  Location: [facility];  Service: Cardiopulmonary;  Laterality: Bilateral;   FEMORAL ARTERY STENT     Left leg  LAPAROTOMY N/A 10/16/2017   Procedure: EXPLORATORY LAPAROTOMY, PARTIAL OMENTECTOMY, RESECTION ISCHEMIC ILEUM 637CH, APPLICATION OF VAC ABDOMINAL DRESSING;  Surgeon: Armandina Gemma, MD;  Location: [facility];  Service: General;  Laterality: N/A;   LAPAROTOMY N/A 10/18/2017   Procedure: RE-EXPLORATION OF ABDOMEN, ILEOSTOMY CREATION;  Surgeon: Stark Klein, MD;  Location: [facility];  Service: General;  Laterality: N/A;   LUNG SURGERY  03/2017   Benign polyps removed   PATELLA REALIGNMENT Left    POLYPECTOMY     SIGMOIDOSCOPY     TONSILLECTOMY      Social History   Socioeconomic History   Marital status: Divorced    Spouse name: Not on file   Number of children: 2   Years of education: Not on file   Highest education level: Not on file  Occupational History   Occupation: retired    Fish farm manager: DISABLED  Tobacco Use   Smoking status: Former    Packs/day: 1.00    Years: 50.00    Pack years: 50.00    Types: Cigarettes     Quit date: 10/14/2010    Years since quitting: 10.9   Smokeless tobacco: Never  Vaping Use   Vaping Use: Never used  Substance and Sexual Activity   Alcohol use: No   Drug use: No   Sexual activity: Not on file  Other Topics Concern   Not on file  Social History Narrative   Not on file   Social Determinants of Health   Financial Resource Strain: Not on file  Food Insecurity: Not on file  Transportation Needs: Not on file  Physical Activity: Not on file  Stress: Not on file  Social Connections: Not on file  Intimate Partner Violence: Not on file    Family History  Problem Relation Age of Onset   Hypertension Mother    Osteoarthritis Mother    Pulmonary embolism Mother    Hypertension Father        aneurysm   Stroke Father    Colon cancer Paternal Grandfather    Stroke Other    Breast cancer Neg Hx    Colon polyps Neg Hx    Esophageal cancer Neg Hx    Rectal cancer Neg Hx    Stomach cancer Neg Hx    Pancreatic cancer Neg Hx    Liver disease Neg Hx     Current Outpatient Medications  Medication Sig Dispense Refill   acetaminophen (TYLENOL) 325 MG tablet Take 2 tablets by mouth as needed.     albuterol (VENTOLIN HFA) 108 (90 Base) MCG/ACT inhaler Inhale 1-2 puffs into the lungs as needed.     amLODipine (NORVASC) 10 MG tablet Take 10 mg by mouth daily.     ARIPiprazole (ABILIFY) 5 MG tablet Take 5 mg by mouth daily.     aspirin 81 MG EC tablet Take 1 tablet by mouth as needed.     atorvastatin (LIPITOR) 20 MG tablet Take 20 mg by mouth daily.     benzonatate (TESSALON) 100 MG capsule Take 100 mg by mouth as needed.     cholestyramine (QUESTRAN) 4 g packet Take 1 packet (4 g total) by mouth 2 (two) times daily. 60 each 11   clopidogrel (PLAVIX) 75 MG tablet Take 75 mg by mouth daily.     [employer] DIGESTIVE PROBIOTIC 250 MG capsule Take 500 mg by mouth daily.     cyclobenzaprine (FLEXERIL) 10 MG tablet Take 0.5 tablets (5 mg total) by mouth 3 (three) times daily as needed  for muscle spasms. 30 tablet 0   diphenoxylate-atropine (LOMOTIL) 2.5-0.025 MG tablet Take 1 tablet by mouth 3 (three) times daily. 90 tablet 1   ferrous sulfate 220 (44 Fe) MG/5ML solution Take 5 mLs (220 mg total) by mouth 2 (two) times daily with a meal. 150 mL 3   fluticasone (FLONASE) 50 MCG/ACT nasal spray Place 2 sprays into both nostrils as needed.     furosemide (LASIX) 20 MG tablet Take 20 mg by mouth daily.     HYDROcodone-acetaminophen (NORCO) 10-325 MG tablet Take 1 tablet by mouth 4 (four) times daily as needed.     levETIRAcetam (KEPPRA) 500 MG tablet TAKE 1 TABLET BY MOUTH TWICE A DAY 180 tablet 2   loperamide (IMODIUM) 2 MG capsule Take 2 mg by mouth daily as needed for diarrhea or loose stools.      losartan (COZAAR) 100 MG tablet Take 50 mg by mouth daily.     magnesium oxide (MAG-OX) 400 MG tablet Take 1 tablet by mouth daily.     metoprolol succinate (TOPROL-XL) 50 MG 24 hr tablet Take 50 mg by mouth daily.     metoprolol tartrate (LOPRESSOR) 100 MG tablet Take 1 tablet (100 mg total) by mouth 2 (two) times daily. 60 tablet 0   mirtazapine (REMERON) 15 MG tablet Take 15 mg by mouth at bedtime.     montelukast (SINGULAIR) 10 MG tablet Take 10 mg by mouth daily.     nitroGLYCERIN (NITROSTAT) 0.4 MG SL tablet Place 1 tablet (0.4 mg total) under the tongue every 5 (five) minutes as needed for chest pain. (Patient not taking: Reported on 08/10/2021) 90 tablet 3   ondansetron (ZOFRAN-ODT) 4 MG disintegrating tablet Dissolve 1 tablet on tongue twice a day for 7 days then decrease to 1 tablet every 6-8 hours as needed. 30 tablet 0   Pancrelipase, Lip-Prot-Amyl, (ZENPEP) 40000-126000 units CPEP Take 2 capsules with meals and one capsule with snacks. 240 capsule 0   pantoprazole (PROTONIX) 40 MG tablet Take 1 tablet (40 mg total) by mouth 2 (two) times daily. 180 tablet 3   SODIUM FLUORIDE 5000 PPM 1.1 % PSTE SMARTSIG:Sparingly By Mouth Twice Daily     traMADol (ULTRAM) 50 MG tablet  Take 50 mg by mouth 3 (three) times daily as needed for moderate pain.     venlafaxine XR (EFFEXOR-XR) 75 MG 24 hr capsule Take 1 capsule (75 mg total) by mouth daily.     vitamin B-12 (CYANOCOBALAMIN) 500 MCG tablet Take 500 mcg by mouth daily.     Vitamin D, Ergocalciferol, (DRISDOL) 1.25 MG (50000 UT) CAPS capsule Take 50,000 Units by mouth every Wednesday.      No current facility-administered medications for this visit.    Allergies  Allergen Reactions   Adhesive [Tape] Other (See Comments)    "Took off my skin"   Other Rash    "Took off my skin"   Ace Inhibitors Other (See Comments)    Other reaction(s): Unknown   Sulfa Antibiotics     Other reaction(s): Unknown   Topiramate Other (See Comments)   Codeine Other (See Comments)    Altered mental state    Iodinated Diagnostic Agents Rash     REVIEW OF SYSTEMS:   [X]  denotes positive finding, [ ]  denotes negative finding Cardiac  Comments:  Chest pain or chest pressure:    Shortness of breath upon exertion:    Short of breath when lying flat:    Irregular heart rhythm:  Vascular    Pain in calf, thigh, or hip brought on by ambulation:    Pain in feet at night that wakes you up from your sleep:     Blood clot in your veins:    Leg swelling:  X       Pulmonary    Oxygen at home:    Productive cough:     Wheezing:         Neurologic    Sudden weakness in arms or legs:     Sudden numbness in arms or legs:     Sudden onset of difficulty speaking or slurred speech:    Temporary loss of vision in one eye:     Problems with dizziness:         Gastrointestinal    Blood in stool:     Vomited blood:         Genitourinary    Burning when urinating:     Blood in urine:        Psychiatric    Major depression:         Hematologic    Bleeding problems:    Problems with blood clotting too easily:        Skin    Rashes or ulcers:        Constitutional    Fever or chills:      PHYSICAL  EXAMINATION:  There were no vitals filed for this visit.  General:  WDWN in NAD; vital signs documented above Gait: Not observed HENT: WNL, normocephalic Pulmonary: normal non-labored breathing , without wheezing Cardiac: regular HR, Abdomen: soft, NT, no masses Skin: without rashes Vascular Exam/Pulses:  Right Left  Radial 2+ (normal) 2+ (normal)  Ulnar 2+ (normal) 2+ (normal)  Femoral    Popliteal    DP 2+ (normal) 2+ (normal)       Extremities: without ischemic changes, without Gangrene , without cellulitis; without open wounds; 3+ bilateral lower extremity edema Musculoskeletal: no muscle wasting or atrophy  Neurologic: A&O X 3;  No focal weakness or paresthesias are detected Psychiatric:  The pt has Normal affect.   Non-Invasive Vascular Imaging:   ABI Findings:  +---------+------------------+-----+---------+--------+   Right     Rt Pressure (mmHg) Index Waveform  Comment    +---------+------------------+-----+---------+--------+   Brachial  170                                           +---------+------------------+-----+---------+--------+   PTA       146                0.86  triphasic            +---------+------------------+-----+---------+--------+   DP        139                0.82  triphasic            +---------+------------------+-----+---------+--------+   Great Toe 75                 0.44                       +---------+------------------+-----+---------+--------+   +---------+------------------+-----+---------+-------+   Left      Lt Pressure (mmHg) Index Waveform  Comment   +---------+------------------+-----+---------+-------+   Brachial  165                                          +---------+------------------+-----+---------+-------+  PTA       146                0.86  triphasic           +---------+------------------+-----+---------+-------+   DP        119                0.70  triphasic            +---------+------------------+-----+---------+-------+   Great Toe 93                 0.55                      +---------+------------------+-----+---------+-------+     ASSESSMENT/PLAN: [name] is a 75 y.o. female presenting in follow-up with peripheral arterial disease, known left SFA stent, bilateral carotid artery disease, left carotid occlusion.  Overall, [name] appears to be doing well.  Her stent is widely patent with no change in her ABIs.  She remains palpable in her feet.  Carotid duplex ultrasonography was not performed today, and therefore I will have the patient come back in an effort to ensure she has had no progression in her disease.  My main concern is her bilateral lower extremity pitting edema.  I do not have an etiology for this, but the patient may benefit from new echocardiography in an effort to ensure she has no cardiovascular etiology.  I asked her to continue her aspirin, Plavix, statin therapy as these medications will help with primary patency of her stent, as well as lower her risk of cerebrovascular and cardiovascular events in the next 5 years.  I will see the patient in 1 year and we will call her with the results of her carotid ultrasound.   [name], MD [facility] [phone]

## 2021-09-28 ENCOUNTER — Encounter: Payer: Self-pay | Admitting: Vascular Surgery

## 2021-09-28 ENCOUNTER — Ambulatory Visit (INDEPENDENT_AMBULATORY_CARE_PROVIDER_SITE_OTHER): Payer: 59 | Admitting: Vascular Surgery

## 2021-09-28 ENCOUNTER — Ambulatory Visit (INDEPENDENT_AMBULATORY_CARE_PROVIDER_SITE_OTHER)
Admission: RE | Admit: 2021-09-28 | Discharge: 2021-09-28 | Disposition: A | Discharge status: Home / Self Care | Payer: 59 | Source: Ambulatory Visit | Attending: Vascular Surgery | Admitting: Vascular Surgery

## 2021-09-28 ENCOUNTER — Ambulatory Visit (HOSPITAL_COMMUNITY)
Admission: RE | Admit: 2021-09-28 | Discharge: 2021-09-28 | Disposition: A | Discharge status: Home / Self Care | Payer: 59 | Source: Ambulatory Visit | Attending: Vascular Surgery | Admitting: Vascular Surgery

## 2021-09-28 ENCOUNTER — Other Ambulatory Visit: Payer: Self-pay

## 2021-09-28 VITALS — BP 170/81 | HR 63 | Temp 97.8°F | Resp 20 | Ht 59.5 in | Wt 171.0 lb

## 2021-09-28 DIAGNOSIS — I779 Disorder of arteries and arterioles, unspecified: Secondary | ICD-10-CM

## 2021-09-28 DIAGNOSIS — Z959 Presence of cardiac and vascular implant and graft, unspecified: Secondary | ICD-10-CM | POA: Diagnosis not present

## 2021-09-28 DIAGNOSIS — I6522 Occlusion and stenosis of left carotid artery: Secondary | ICD-10-CM

## 2021-09-28 DIAGNOSIS — I6521 Occlusion and stenosis of right carotid artery: Secondary | ICD-10-CM

## 2021-10-05 ENCOUNTER — Other Ambulatory Visit: Payer: Self-pay | Admitting: Cardiology

## 2021-10-10 ENCOUNTER — Telehealth: Payer: Self-pay | Admitting: Gastroenterology

## 2021-10-10 NOTE — Telephone Encounter (Signed)
Attempted to reach patient.  Her mailbox is full and no answer.  I will try and reach patient at a later time

## 2021-10-10 NOTE — Telephone Encounter (Signed)
Patient called and stated that she has been nauseous and has been spitting up blood, believes that she may have ulcers. Seeking advice, please advise.

## 2021-10-12 NOTE — Telephone Encounter (Signed)
Attempted again to return call.  No answers and her VM remains full.  I will await a return call from her

## 2021-10-16 ENCOUNTER — Telehealth: Payer: Self-pay | Admitting: Gastroenterology

## 2021-10-16 NOTE — Telephone Encounter (Signed)
I attempted to return call to patient.  Her VM is full. Unable to leave a message

## 2021-10-16 NOTE — Telephone Encounter (Signed)
Patient called requesting Dr. Lynne Leader nurse call her.  She has experienced nausea for the past several days and has recently also been spitting up blood.  Please call and advise.  Thank you.

## 2021-10-17 ENCOUNTER — Other Ambulatory Visit: Payer: Self-pay | Admitting: Gastroenterology

## 2021-10-17 NOTE — Telephone Encounter (Signed)
Patient called back this am.  She is reporting coughing spells then she has blood in her "phlegm".  She is advised that she should speak with her PCP for an appointment to be evaluated.  I inquired if she has had any vomiting, but she denies and only sees blood with coughing spells

## 2021-10-24 ENCOUNTER — Encounter: Payer: Self-pay | Admitting: Physician Assistant

## 2021-10-24 ENCOUNTER — Other Ambulatory Visit (INDEPENDENT_AMBULATORY_CARE_PROVIDER_SITE_OTHER): Payer: 59

## 2021-10-24 ENCOUNTER — Ambulatory Visit (INDEPENDENT_AMBULATORY_CARE_PROVIDER_SITE_OTHER)
Admission: RE | Admit: 2021-10-24 | Discharge: 2021-10-24 | Disposition: A | Discharge status: Home / Self Care | Payer: 59 | Source: Ambulatory Visit | Attending: Physician Assistant | Admitting: Physician Assistant

## 2021-10-24 ENCOUNTER — Telehealth: Payer: Self-pay

## 2021-10-24 ENCOUNTER — Other Ambulatory Visit: Payer: Self-pay

## 2021-10-24 ENCOUNTER — Ambulatory Visit (INDEPENDENT_AMBULATORY_CARE_PROVIDER_SITE_OTHER): Payer: 59 | Admitting: Physician Assistant

## 2021-10-24 VITALS — BP 154/80 | HR 76 | Ht 59.5 in | Wt 160.2 lb

## 2021-10-24 DIAGNOSIS — D638 Anemia in other chronic diseases classified elsewhere: Secondary | ICD-10-CM

## 2021-10-24 DIAGNOSIS — R110 Nausea: Secondary | ICD-10-CM

## 2021-10-24 DIAGNOSIS — R109 Unspecified abdominal pain: Secondary | ICD-10-CM | POA: Diagnosis not present

## 2021-10-24 DIAGNOSIS — K920 Hematemesis: Secondary | ICD-10-CM

## 2021-10-24 LAB — COMPREHENSIVE METABOLIC PANEL
ALT: 13 U/L (ref 0–35)
AST: 16 U/L (ref 0–37)
Albumin: 4 g/dL (ref 3.5–5.2)
Alkaline Phosphatase: 87 U/L (ref 39–117)
BUN: 17 mg/dL (ref 6–23)
CO2: 25 mEq/L (ref 19–32)
Calcium: 9.1 mg/dL (ref 8.4–10.5)
Chloride: 106 mEq/L (ref 96–112)
Creatinine, Ser: 1.62 mg/dL — ABNORMAL HIGH (ref 0.40–1.20)
GFR: 31.13 mL/min — ABNORMAL LOW (ref >60.00)
Glucose, Bld: 75 mg/dL (ref 70–99)
Potassium: 4.4 mEq/L (ref 3.5–5.1)
Sodium: 137 mEq/L (ref 135–145)
Total Bilirubin: 0.3 mg/dL (ref 0.2–1.2)
Total Protein: 6.8 g/dL (ref 6.0–8.3)

## 2021-10-24 LAB — CBC WITH DIFFERENTIAL/PLATELET
Basophils Absolute: 0.1 10*3/uL (ref 0.0–0.1)
Basophils Relative: 0.6 % (ref 0.0–3.0)
Eosinophils Absolute: 0.4 10*3/uL (ref 0.0–0.7)
Eosinophils Relative: 3.1 % (ref 0.0–5.0)
HCT: 37.7 % (ref 36.0–46.0)
Hemoglobin: 12.1 g/dL (ref 12.0–15.0)
Lymphocytes Relative: 18.3 % (ref 12.0–46.0)
Lymphs Abs: 2.2 10*3/uL (ref 0.7–4.0)
MCHC: 32.1 g/dL (ref 30.0–36.0)
MCV: 94.9 fl (ref 78.0–100.0)
Monocytes Absolute: 0.8 10*3/uL (ref 0.1–1.0)
Monocytes Relative: 7.1 % (ref 3.0–12.0)
Neutro Abs: 8.4 10*3/uL — ABNORMAL HIGH (ref 1.4–7.7)
Neutrophils Relative %: 70.9 % (ref 43.0–77.0)
Platelets: 239 10*3/uL (ref 150.0–400.0)
RBC: 3.98 Mil/uL (ref 3.87–5.11)
RDW: 13.5 % (ref 11.5–15.5)
WBC: 11.9 10*3/uL — ABNORMAL HIGH (ref 4.0–10.5)

## 2021-10-24 MED ORDER — PANTOPRAZOLE SODIUM 40 MG PO TBEC: 40.0000 mg | DELAYED_RELEASE_TABLET | Freq: Two times a day (BID) | ORAL | 3 refills | Status: DC

## 2021-10-24 MED ORDER — ONDANSETRON HCL 4 MG PO TABS: 4.0000 mg | ORAL_TABLET | Freq: Four times a day (QID) | ORAL | 1 refills | Status: AC | PRN

## 2021-10-24 NOTE — Telephone Encounter (Signed)
° °  [name] 1946/02/19 [id number]  Dear Dr. Philip Aspen:  We have scheduled the above named patient for a(n) Endoscopy procedure. Our records show that (s)he is on anticoagulation therapy.  Please advise as to whether the patient may come off their therapy of Plavix 5 days prior to their procedure which is scheduled for 11/01/2021.  Please route your response to Cherylann Parr, CMA or fax response to [phone].  Sincerely,    [facility]  Dr.

## 2021-10-24 NOTE — Telephone Encounter (Signed)
[name] from [facility] did not receive the clearance letter you faxed to her earlier to clear patient for Plavix.  If you will please re-fax it to [phone].  If you have questions, please call [name] at [phone].  She works with Dr. Sharlett Iles there.  Thank you.

## 2021-10-24 NOTE — Progress Notes (Signed)
Subjective:    Patient ID: [name], female    DOB: November 26, 1945, 76 y.o.   MRN: [id number]  HPI [name] is a pleasant 76 year old female, established with Dr. Fuller Plan, who had last been seen in the office in October 2022.  She has a complicated history with ischemic colitis requiring right colectomy in 2019, and then underwent ileostomy takedown and small bowel resection in June 2022.  She has had persistent problems with diarrhea. She comes back in today with complaints of nausea over the past 2 weeks, and had been intermittently noticing blood in her "phlegm".  She does not believe that she has been coughing this up as she does not have a cough but says she had had some gagging and increased saliva with nausea and that is when she brings up the blood.  She describes quarter sized areas of blood in the "phlegm".  She was seen by her PCP Dr. Bevelyn Buckles, started on Protonix 40 mg p.o. twice daily and has been using Zofran as needed for nausea. She says that she is still nauseated but has not had any vomiting and is able to keep food down.  No current complaints of abdominal pain, she does endorse heartburn at nighttime, no dysphagia. She has not "spit up" any more blood in the past week.  She denies any melena or hematochezia and had not noticed any melena or hematochezia at the time she had onset of those symptoms. She is concerned about recurrent "blockage" as she had nausea with her issues in the past.  Appetite has not been good. Patient has multiple other comorbidities including adult onset diabetes mellitus, prior history of gastroparesis, iron deficiency anemia, chronic kidney disease stage III, fatty liver, COPD, congestive heart failure, carotid stenosis, prior CVA, peripheral vascular disease with previous left SFA stent and coronary disease. She is maintained on Plavix and aspirin.  Most recent labs have been done October 2022 with hemoglobin 12.3 hematocrit 35.8. Last EGD September  2021 with medium sized hiatal hernia and some retained food in the stomach. She had flexible sigmoidoscopy June 2022 negative for any evidence of polyps and at that time decision made not to pursue any further surveillance colonoscopies. As far as her diarrhea is concerned she continues to have about 6 bowel movements per day which she describes as liquid to pudding consistency.  She is taking Questran 4 g twice daily and takes Lomotil twice daily, she also continues on pancreatic enzyme supplementation.  Review of Systems  Pertinent positive and negative review of systems were noted in the above HPI section.  All other review of systems was otherwise negative.   Outpatient Encounter Medications as of 10/24/2021  Medication Sig   ondansetron (ZOFRAN) 4 MG tablet Take 1 tablet (4 mg total) by mouth every 6 (six) hours as needed for nausea or vomiting.   acetaminophen (TYLENOL) 325 MG tablet Take 2 tablets by mouth as needed.   albuterol (VENTOLIN HFA) 108 (90 Base) MCG/ACT inhaler Inhale 1-2 puffs into the lungs as needed.   amLODipine (NORVASC) 10 MG tablet Take 10 mg by mouth daily.   ARIPiprazole (ABILIFY) 5 MG tablet Take 5 mg by mouth daily.   aspirin 81 MG EC tablet Take 1 tablet by mouth as needed.   atorvastatin (LIPITOR) 20 MG tablet Take 20 mg by mouth daily.   benzonatate (TESSALON) 100 MG capsule Take 100 mg by mouth as needed.   cholestyramine (QUESTRAN) 4 g packet Take 1 packet (4 g  total) by mouth 2 (two) times daily.   clopidogrel (PLAVIX) 75 MG tablet Take 75 mg by mouth daily.   [employer] DIGESTIVE PROBIOTIC 250 MG capsule Take 500 mg by mouth daily.   cyclobenzaprine (FLEXERIL) 10 MG tablet Take 0.5 tablets (5 mg total) by mouth 3 (three) times daily as needed for muscle spasms.   diphenoxylate-atropine (LOMOTIL) 2.5-0.025 MG tablet Take 1 tablet by mouth 3 (three) times daily.   ferrous sulfate 220 (44 Fe) MG/5ML solution Take 5 mLs (220 mg total) by mouth 2 (two) times daily with a  meal.   fluticasone (FLONASE) 50 MCG/ACT nasal spray Place 2 sprays into both nostrils as needed.   furosemide (LASIX) 20 MG tablet Take 20 mg by mouth daily.   HYDROcodone-acetaminophen (NORCO) 10-325 MG tablet Take 1 tablet by mouth 4 (four) times daily as needed.   levETIRAcetam (KEPPRA) 500 MG tablet TAKE 1 TABLET BY MOUTH TWICE A DAY   loperamide (IMODIUM) 2 MG capsule Take 2 mg by mouth daily as needed for diarrhea or loose stools.    losartan (COZAAR) 100 MG tablet TAKE 1/2 A TABLET BY MOUTH ONCE DAILY   magnesium oxide (MAG-OX) 400 MG tablet Take 1 tablet by mouth daily.   metoprolol tartrate (LOPRESSOR) 100 MG tablet Take 1 tablet (100 mg total) by mouth 2 (two) times daily.   mirtazapine (REMERON) 15 MG tablet Take 15 mg by mouth at bedtime.   montelukast (SINGULAIR) 10 MG tablet Take 10 mg by mouth daily.   nitroGLYCERIN (NITROSTAT) 0.4 MG SL tablet Place 1 tablet (0.4 mg total) under the tongue every 5 (five) minutes as needed for chest pain.   ondansetron (ZOFRAN-ODT) 4 MG disintegrating tablet Dissolve 1 tablet on tongue twice a day for 7 days then decrease to 1 tablet every 6-8 hours as needed.   Pancrelipase, Lip-Prot-Amyl, (ZENPEP) 40000-126000 units CPEP Take 2 capsules with meals and one capsule with snacks.   pantoprazole (PROTONIX) 40 MG tablet Take 1 tablet (40 mg total) by mouth 2 (two) times daily.   SODIUM FLUORIDE 5000 PPM 1.1 % PSTE SMARTSIG:Sparingly By Mouth Twice Daily   traMADol (ULTRAM) 50 MG tablet Take 50 mg by mouth 3 (three) times daily as needed for moderate pain.   venlafaxine XR (EFFEXOR-XR) 75 MG 24 hr capsule Take 1 capsule (75 mg total) by mouth daily.   vitamin B-12 (CYANOCOBALAMIN) 500 MCG tablet Take 500 mcg by mouth daily.   Vitamin D, Ergocalciferol, (DRISDOL) 1.25 MG (50000 UT) CAPS capsule Take 50,000 Units by mouth every Wednesday.    [DISCONTINUED] pantoprazole (PROTONIX) 40 MG tablet TAKE 1 TABLET BY MOUTH TWICE A DAY   No  facility-administered encounter medications on file as of 10/24/2021.   Allergies  Allergen Reactions   Adhesive [Tape] Other (See Comments)    "Took off my skin"   Other Rash    "Took off my skin"   Ace Inhibitors Other (See Comments)    Other reaction(s): Unknown   Sulfa Antibiotics     Other reaction(s): Unknown   Topiramate Other (See Comments)   Codeine Other (See Comments)    Altered mental state    Iodinated Contrast Media Rash   Patient Active Problem List   Diagnosis Date Noted   Pharyngeal dysphagia 07/12/2021   Pressure ulcer of sacral region 07/12/2021   Wound of abdomen 07/12/2021   Closed fracture of distal end of left radius 06/15/2021   Degeneration of lumbar intervertebral disc 04/23/2021   Hyponatremia 11/16/2020  Metabolic acidosis, NAG, failure of bicarbonate regeneration 11/16/2020   Nausea & vomiting 11/16/2020   Acute-on-chronic kidney injury ([place]) 11/16/2020   Abdominal aortic atherosclerosis ([place]) 06/01/2020   Acquired thrombophilia ([place]) 06/01/2020   Insomnia 05/08/2020   Neck pain 01/11/2020   Referred otalgia of both ears 01/11/2020   Frequency of micturition 12/16/2019   Orthostatic hypotension 12/16/2019   Urinary tract infectious disease 12/16/2019   Pneumonia due to coronavirus disease 2019 11/17/2019   Acute combined systolic and diastolic heart failure ([place]) 10/29/2019   COVID-19 10/29/2019   Hyperkalemia 10/16/2019   Bilateral pneumonia 38/75/6433   Acute metabolic encephalopathy 29/51/8841   Noninfective gastroenteritis and colitis, unspecified 09/01/2019   Hypo-osmolality and hyponatremia 08/04/2019   Spasm 06/03/2019   Seizures ([place]) 01/07/2019   Cerebrovascular accident (CVA) due to occlusion of left carotid artery (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  Ileostomy 10/18/2017 10/14/2017   Ischemic colitis ([place]) 10/14/2017   Acute respiratory failure with hypoxemia ([place]) 10/14/2017   Acute post-operative pain 10/14/2017   Acute kidney injury superimposed on CKD ([place]) 10/14/2017   Anemia 10/14/2017   Benign paroxysmal positional vertigo 07/29/2017   Nocturia more than twice per night 05/07/2017   Diabetes due to underlying condition w oth circulatory comp ([place]) 05/07/2017   Atherosclerotic PVD with intermittent claudication ([place]) 05/07/2017   Poor sleep hygiene 05/07/2017   Paradoxical insomnia 05/07/2017   Abnormal feces 04/10/2017   Hilar lymphadenopathy    Hemoptysis 02/19/2017   Pulmonary nodules/lesions, multiple 02/19/2017   Tobacco use 02/19/2017   Disorder of arteries and arterioles, unspecified ([place]) 01/31/2017   Lung field abnormal 01/31/2017   RUQ abdominal pain 01/13/2017   Restless legs syndrome 11/28/2016   Left-sided extracranial carotid artery occlusion 11/26/2016   PAOD (peripheral arterial occlusive disease) ([place]) 11/26/2016   Encounter for routine follow-up after surgery of the circulatory system 11/26/2016   Abnormal weight loss 01/29/2016   Allergic rhinitis 01/29/2016   Chest pain with moderate risk of acute coronary syndrome 10/27/2015   Other slipping, tripping and stumbling without falling, initial encounter 07/28/2015   Acid  indigestion 12/31/2013   Pain in left hip 08/26/2013   Screening for gout 05/13/2013   Disorder of skin or subcutaneous tissue 12/22/2012   Difficult or painful urination 09/02/2012   Peripheral blood vessel disorder ([place]) 05/11/2012   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[place]) 05/11/2012   Follow-up examination, following unspecified surgery 05/11/2012   Occlusion and stenosis of carotid artery without mention of cerebral infarction 04/14/2012   Fatigue 09/24/2011   Gonalgia 07/04/2011   Avitaminosis D 05/02/2011   Chronic pancreatitis ([place]) 04/30/2011   Vitamin B12 deficiency 04/30/2011   Intestinal motility disorder 04/30/2011   Fatty liver 04/30/2011   Adiposity 04/30/2011   Other and unspecified  general psychiatric examination 04/26/2011   Abdominal pain 04/09/2011   Iron deficiency anemia, unspecified  04/09/2011   Other general symptoms  04/09/2011   Asthma 04/09/2011   Type 2 diabetes mellitus with diabetic peripheral angiopathy without gangrene ([place]) 01/24/2011   Abnormal weight gain 11/30/2010   Dyslipidemia    Dizziness    Right-sided extracranial carotid artery stenosis    Normal nuclear stress test    Feeling bilious 07/11/2010   Underimmunization status 07/11/2010   Pancreas (digestive gland) works poorly 05/30/2010   B12 deficiency 03/19/2010   DVT 03/16/2010   BLIND LOOP SYNDROME 03/16/2010   Diarrhea 03/16/2010   Chronic depression 02/23/2010   Major depression, single episode 02/23/2010   Cough 11/17/2009   Plantar verruca 11/17/2009   Arteriosclerosis of coronary artery 11/16/2009   Claudication ([place]) 11/16/2009   Compulsive tobacco user syndrome 11/16/2009   HLD (hyperlipidemia) 11/16/2009   Nontoxic single thyroid nodule 11/16/2009   Obstructive apnea 11/16/2009   Radial styloid tenosynovitis 11/16/2009   LBP (low back pain) 11/16/2009   Gastroparesis 11/16/2009   LAXATIVE ABUSE 12/17/2007   BP (high blood pressure) 12/17/2007   Acid reflux  12/17/2007   HIATAL HERNIA 12/17/2007   IBS 12/17/2007   MELANOSIS COLI 12/17/2007   ARTHRITIS 12/17/2007   HEPATOMEGALY 12/17/2007   Social History   Socioeconomic History   Marital status: Divorced    Spouse name: Not on file   Number of children: 2   Years of education: Not on file   Highest education level: Not on file  Occupational History   Occupation: retired    Fish farm manager: DISABLED  Tobacco Use   Smoking status: Former    Packs/day: 1.00    Years: 50.00    Pack years: 50.00    Types: Cigarettes    Quit date: 10/14/2010    Years since quitting: 11.0   Smokeless tobacco: Never  Vaping Use   Vaping Use: Never used  Substance and Sexual Activity   Alcohol use: No   Drug use: No   Sexual activity: Not on file  Other Topics Concern   Not on file  Social History Narrative   Not on file   Social Determinants of Health   Financial Resource Strain: Not on file  Food Insecurity: Not on file  Transportation Needs: Not on file  Physical Activity: Not on file  Stress: Not on file  Social Connections: Not on file  Intimate Partner Violence: Not on file    Ms. Dahle's family history includes Colon cancer in her paternal grandfather; Hypertension in her father and mother; Osteoarthritis in her mother; Pulmonary embolism in her mother; Stroke in her father and another family member.      Objective:    Vitals:   10/24/21 1059  BP: (!) 154/80  Pulse: 76    Physical Exam.  Well-developed well-nourished chronically ill-appearing, elderly female in no acute distress.   Weight, 160 BMI 31.8  HEENT; nontraumatic normocephalic, EOMI, PE R LA, sclera anicteric. Oropharynx; not examined today Neck; supple, no JVD Cardiovascular; regular rate and rhythm with S1-S2, no murmur rub or gallop Pulmonary; Clear bilaterally Abdomen; soft, there are some mild tenderness in the epigastrium, and to the left of her midline incisional scar with some fullness but no definite ventral  hernia at that site, extensive incisional scars nondistended, no palpable mass or hepatosplenomegaly, bowel sounds are active Rectal; not done today Skin; benign exam, no jaundice rash or appreciable lesions Extremities; no clubbing cyanosis or edema skin  warm and dry Neuro/Psych; alert and oriented x4, grossly nonfocal mood and affect appropriate        Assessment & Plan:   #19 76 year old female with 2-week history of nausea without vomiting, at onset of symptoms she had complaint of "spitting up" phlegm with blood in it.  This subsided about a week ago.  No associated melena or hematochezia Patient is on aspirin and Plavix. Etiology of the nausea and possible hematemesis is not clear, patient has had extensive abdominal surgery, and has several known abdominal wall hernias Rule out gastritis, peptic ulcer disease, esophagitis  #2 history of ischemic colitis 2019 requiring right colectomy with ileostomy, subsequent ileostomy takedown and small bowel resection June 2022, patient continues to have chronic diarrhea with 6 bowel movements per day, currently being maintained on Questran twice daily and Lomotil twice daily  #3 chronic anemia/history of iron deficiency-multifactoral followed by hematology #4 history of prior CVA 5.  Peripheral vascular disease status post remote SFA stent on the left 6.  Coronary artery disease 7.  Adult onset diabetes mellitus 8.  COPD no oxygen use 9.  Fatty liver 10.  Chronic kidney disease stage III #11 pancreatic insufficiency-on replacement  Plan; continue Protonix 40 mg p.o. twice daily AC breakfast and AC dinner Continue Zofran 4 mg every 6 hours as needed for nausea, refills sent CBC with differential, c-Met, Chest x-ray PA and lateral and KUB today Patient will be scheduled for upper endoscopy with Dr. Fuller Plan.  Procedure was discussed in detail with the patient including indications risks and benefits and she is agreeable to proceed.  As of today's  visit she is appropriate for endoscopic evaluation in the ambulatory care setting Plavix will need to be held for 5 days prior to procedure.  We will communicate with her PCP Dr. Bevelyn Buckles to assure this is feasible for this patient. Further recommendations pending results of above.  If EGD is unremarkable and symptoms are persisting she will need cross-sectional imaging of her abdomen and pelvis. [name] Genia Harold PA-C 10/24/2021   Cc: Donnajean Lopes, MD

## 2021-10-24 NOTE — Patient Instructions (Signed)
If you are age 75 or older, your body mass index should be between 23-30. Your Body mass index is 31.81 kg/m. If this is out of the aforementioned range listed, please consider follow up with your Primary Care Provider. ________________________________________________________  The [facility] providers would like to encourage you to use [facility] to communicate with providers for non-urgent requests or questions.  Due to long hold times on the telephone, sending your provider a message by [facility] may be a faster and more efficient way to get a response.  Please allow 48 business hours for a response.  Please remember that this is for non-urgent requests.  _______________________________________________________  Your provider has requested that you go to the basement level for lab work before leaving today. Press "B" on the elevator. The lab is located at the first door on the left as you exit the elevator.  Your provider has requested that you have an abdominal x ray before leaving today. Please go to the basement floor to our Radiology department for the test.  You have been scheduled for an endoscopy. Please follow written instructions given to you at your visit today. If you use inhalers (even only as needed), please bring them with you on the day of your procedure.  You will be contacted by our office prior to your procedure for directions on holding your Plavix.  If you do not hear from our office 1 week prior to your scheduled procedure, please call [phone] to discuss.   Continue Pantoprazole 1 tablet twice daily  Continue Ondansetron 1 tablet every 6 hours as needed for nausea.  Continue current medications for diarrhea.  Thank you for entrusting me with your care and choosing [facility].  Amy Esterwood, PA-C

## 2021-10-25 ENCOUNTER — Other Ambulatory Visit: Payer: Self-pay | Admitting: Internal Medicine

## 2021-10-25 DIAGNOSIS — R1110 Vomiting, unspecified: Secondary | ICD-10-CM

## 2021-10-26 NOTE — Telephone Encounter (Signed)
Left voicemail for [name] to let me know about clearnce

## 2021-10-26 NOTE — Telephone Encounter (Signed)
Notification received back from Dr. Shon Baton office patient is clear to stop Plavix 5 days prior to her procedure. Patient has been contacted, and understands to take her last dose tomorrow and stop taking Plavix on Sunday. She has been advised we will let her know when to restart.

## 2021-10-31 ENCOUNTER — Ambulatory Visit: Payer: 59 | Admitting: Gastroenterology

## 2021-11-01 ENCOUNTER — Ambulatory Visit (AMBULATORY_SURGERY_CENTER): Payer: 59 | Admitting: Gastroenterology

## 2021-11-01 ENCOUNTER — Other Ambulatory Visit: Payer: Self-pay

## 2021-11-01 ENCOUNTER — Encounter: Payer: Self-pay | Admitting: Gastroenterology

## 2021-11-01 VITALS — BP 122/78 | HR 70 | Temp 97.5°F | Resp 14 | Ht 59.0 in | Wt 160.0 lb

## 2021-11-01 DIAGNOSIS — K2970 Gastritis, unspecified, without bleeding: Secondary | ICD-10-CM | POA: Diagnosis not present

## 2021-11-01 DIAGNOSIS — K2951 Unspecified chronic gastritis with bleeding: Secondary | ICD-10-CM | POA: Diagnosis not present

## 2021-11-01 DIAGNOSIS — K920 Hematemesis: Secondary | ICD-10-CM | POA: Diagnosis not present

## 2021-11-01 DIAGNOSIS — R112 Nausea with vomiting, unspecified: Secondary | ICD-10-CM

## 2021-11-01 DIAGNOSIS — K449 Diaphragmatic hernia without obstruction or gangrene: Secondary | ICD-10-CM

## 2021-11-01 MED ORDER — SODIUM CHLORIDE 0.9 % IV SOLN: 500.0000 mL | Freq: Once | INTRAVENOUS | Status: DC

## 2021-11-01 NOTE — Patient Instructions (Signed)
HANDOUTS GIVEN FOR GASTRITIS AND HIATAL HERNIA  RESUME PLAVIX TOMORROW AT PRIOR DOSE.  Friday 11/02/21   YOU HAD AN ENDOSCOPIC PROCEDURE TODAY AT THE [facility]:   Refer to the procedure report that was given to you for any specific questions about what was found during the examination.  If the procedure report does not answer your questions, please call your gastroenterologist to clarify.  If you requested that your care partner not be given the details of your procedure findings, then the procedure report has been included in a sealed envelope for you to review at your convenience later.  YOU SHOULD EXPECT: Some feelings of bloating in the abdomen. Passage of more gas than usual.  Walking can help get rid of the air that was put into your GI tract during the procedure and reduce the bloating. If you had a lower endoscopy (such as a colonoscopy or flexible sigmoidoscopy) you may notice spotting of blood in your stool or on the toilet paper. If you underwent a bowel prep for your procedure, you may not have a normal bowel movement for a few days.  Please Note:  You might notice some irritation and congestion in your nose or some drainage.  This is from the oxygen used during your procedure.  There is no need for concern and it should clear up in a day or so.  SYMPTOMS TO REPORT IMMEDIATELY:  Following upper endoscopy (EGD)  Vomiting of blood or coffee ground material  New chest pain or pain under the shoulder blades  Painful or persistently difficult swallowing  New shortness of breath  Fever of 100F or higher  Black, tarry-looking stools  For urgent or emergent issues, a gastroenterologist can be reached at any hour by calling [phone]. Do not use MyChart messaging for urgent concerns.    DIET:  We do recommend a small meal at first, but then you may proceed to your regular diet.  Drink plenty of fluids but you should avoid alcoholic beverages for 24 hours.  ACTIVITY:   You should plan to take it easy for the rest of today and you should NOT DRIVE or use heavy machinery until tomorrow (because of the sedation medicines used during the test).    FOLLOW UP: Our staff will call the number listed on your records 48-72 hours following your procedure to check on you and address any questions or concerns that you may have regarding the information given to you following your procedure. If we do not reach you, we will leave a message.  We will attempt to reach you two times.  During this call, we will ask if you have developed any symptoms of COVID 19. If you develop any symptoms (ie: fever, flu-like symptoms, shortness of breath, cough etc.) before then, please call [phone].  If you test positive for Covid 19 in the 2 weeks post procedure, please call and report this information to [place].    If any biopsies were taken you will be contacted by phone or by letter within the next 1-3 weeks.  Please call us at [phone] if you have not heard about the biopsies in 3 weeks.    SIGNATURES/CONFIDENTIALITY: You and/or your care partner have signed paperwork which will be entered into your electronic medical record.  These signatures attest to the fact that that the information above on your After Visit Summary has been reviewed and is understood.  Full responsibility of the confidentiality of this discharge information lies with  you and/or your care-partner.  °

## 2021-11-01 NOTE — Progress Notes (Signed)
Called to room to assist during endoscopic procedure.  Patient ID and intended procedure confirmed with present staff. Received instructions for my participation in the procedure from the performing physician.  

## 2021-11-01 NOTE — Progress Notes (Signed)
Pt in recovery with monitors in place, VSS. Report given to receiving RN. Bite guard was placed with pt awake to ensure comfort. No dental or soft tissue damage noted. 

## 2021-11-01 NOTE — Op Note (Addendum)
[place] Patient Name: [name] Procedure Date: 11/01/2021 10:38 AM MRN: [id number] Endoscopist: Ladene Artist , MD Age: 76 Referring MD:  Date of Birth: 11/12/1945 Gender: Female Account #: [id number] Procedure:                Upper GI endoscopy Indications:              Hematemesis (small volume), Nausea with vomiting Medicines:                Monitored Anesthesia Care Procedure:                Pre-Anesthesia Assessment:                           - Prior to the procedure, a History and Physical                            was performed, and patient medications and                            allergies were reviewed. The patient's tolerance of                            previous anesthesia was also reviewed. The risks                            and benefits of the procedure and the sedation                            options and risks were discussed with the patient.                            All questions were answered, and informed consent                            was obtained. Prior Anticoagulants: The patient has                            taken Plavix (clopidogrel), last dose was 5 days                            prior to procedure. ASA Grade Assessment: III - A                            patient with severe systemic disease. After                            reviewing the risks and benefits, the patient was                            deemed in satisfactory condition to undergo the                            procedure.  After obtaining informed consent, the endoscope was                            passed under direct vision. Throughout the                            procedure, the patient's blood pressure, pulse, and                            oxygen saturations were monitored continuously. The                            Endoscope was introduced through the mouth, and                            advanced to the second part of duodenum.  The upper                            GI endoscopy was accomplished without difficulty.                            The patient tolerated the procedure well. Scope In: Scope Out: Findings:                 The examined esophagus was normal.                           Diffuse mild inflammation characterized by                            erythema, friability and granularity was found in                            the gastric fundus and in the gastric body.                            Biopsies were taken with a cold forceps for                            histology.                           A medium-sized hiatal hernia was present.                           A few localized small erosions with no bleeding and                            no stigmata of recent bleeding were found in the                            cardia.                           The exam of the stomach was otherwise normal.  The duodenal bulb and second portion of the                            duodenum were normal. Complications:            No immediate complications. Estimated Blood Loss:     Estimated blood loss was minimal. Impression:               - Normal esophagus.                           - Gastritis. Biopsied.                           - Medium-sized hiatal hernia.                           - Erosive gastropathy associated with the hiatal                            hernia, Cameron erosions, with no bleeding and no                            stigmata of recent bleeding.                           - Normal duodenal bulb and second portion of the                            duodenum. Recommendation:           - Patient has a contact number available for                            emergencies. The signs and symptoms of potential                            delayed complications were discussed with the                            patient. Return to normal activities tomorrow.                             Written discharge instructions were provided to the                            patient.                           - Resume previous diet.                           - Follow antireflux measures long term.                           - Minimize Imodium, Lomotil, Norco use.                           -  Continue present medications.                           - Await pathology results.                           - Resume Plavix (clopidogrel) at prior dose                            tomorrow. Refer to managing physician for further                            adjustment of therapy. Ladene Artist, MD 11/01/2021 10:54:43 AM This report has been signed electronically.

## 2021-11-01 NOTE — Progress Notes (Signed)
See 10/24/2021 H&P, no changes.  °

## 2021-11-02 ENCOUNTER — Inpatient Hospital Stay (HOSPITAL_COMMUNITY): Admission: RE | Admit: 2021-11-02 | Payer: 59 | Source: Ambulatory Visit

## 2021-11-02 ENCOUNTER — Ambulatory Visit (HOSPITAL_COMMUNITY)
Admission: RE | Admit: 2021-11-02 | Discharge: 2021-11-02 | Disposition: A | Discharge status: Home / Self Care | Payer: 59 | Source: Ambulatory Visit | Attending: Vascular Surgery | Admitting: Vascular Surgery

## 2021-11-02 DIAGNOSIS — I6522 Occlusion and stenosis of left carotid artery: Secondary | ICD-10-CM | POA: Insufficient documentation

## 2021-11-02 DIAGNOSIS — I6521 Occlusion and stenosis of right carotid artery: Secondary | ICD-10-CM | POA: Diagnosis not present

## 2021-11-05 ENCOUNTER — Telehealth: Payer: Self-pay

## 2021-11-05 NOTE — Telephone Encounter (Signed)
°  Follow up Call-  Call back number 11/01/2021 06/27/2020  Post procedure Call Back phone  # [phone]  Permission to leave phone message Yes Yes  Some recent data might be hidden     Patient questions:  Do you have a fever, pain , or abdominal swelling? No. Pain Score  0 *  Have you tolerated food without any problems? Yes.    Have you been able to return to your normal activities? Yes.    Do you have any questions about your discharge instructions: Diet   No. Medications  No. Follow up visit  No.  Do you have questions or concerns about your Care? No.  Actions: * If pain score is 4 or above: No action needed, pain <4.  Have you developed a fever since your procedure? no  2.   Have you had an respiratory symptoms (SOB or cough) since your procedure? no  3.   Have you tested positive for COVID 19 since your procedure no  4.   Have you had any family members/close contacts diagnosed with the COVID 19 since your procedure?  no   If yes to any of these questions please route to Joylene John, RN and Joella Prince, RN

## 2021-11-14 ENCOUNTER — Encounter: Payer: Self-pay | Admitting: Gastroenterology

## 2021-11-22 DIAGNOSIS — E785 Hyperlipidemia, unspecified: Secondary | ICD-10-CM | POA: Diagnosis not present

## 2021-11-22 DIAGNOSIS — N1832 Chronic kidney disease, stage 3b: Secondary | ICD-10-CM | POA: Diagnosis not present

## 2021-11-22 DIAGNOSIS — Z8616 Personal history of COVID-19: Secondary | ICD-10-CM | POA: Diagnosis not present

## 2021-11-22 DIAGNOSIS — N390 Urinary tract infection, site not specified: Secondary | ICD-10-CM | POA: Diagnosis not present

## 2021-11-22 DIAGNOSIS — K219 Gastro-esophageal reflux disease without esophagitis: Secondary | ICD-10-CM | POA: Diagnosis not present

## 2021-11-22 DIAGNOSIS — R609 Edema, unspecified: Secondary | ICD-10-CM | POA: Diagnosis not present

## 2021-11-22 DIAGNOSIS — I129 Hypertensive chronic kidney disease with stage 1 through stage 4 chronic kidney disease, or unspecified chronic kidney disease: Secondary | ICD-10-CM | POA: Diagnosis not present

## 2021-11-22 DIAGNOSIS — D509 Iron deficiency anemia, unspecified: Secondary | ICD-10-CM | POA: Diagnosis not present

## 2021-11-29 ENCOUNTER — Other Ambulatory Visit: Payer: Self-pay | Admitting: Neurology

## 2021-12-04 NOTE — Progress Notes (Signed)
Cardiology Office Note    Date:  12/11/2021   ID:  Joanna, Strong 1946/01/14, MRN [id number]  PCP:  Donnajean Lopes, MD  Cardiologist:  Dr. Martinique  Chief Complaint  Patient presents with   Coronary Artery Disease    History of Present Illness:  [name] is a 76 y.o. female with PMH of carotid artery disease with known occlusion of left ICA, CAD, DM 2, diabetes insipidus, dizziness, HTN, GERD, chronic pancreatitis, CVA, PAD with stent to the left SFA, and obstructive sleep apnea. She has known history of coronary artery disease with 60-70% stenosis in mid LAD. Cardiac catheterization in 1999, 2002, 2006, 2012 showed stable anatomy.  Her carotid artery disease is being followed yearly by [facility]. Her lower extremity arterial Doppler obtained in February 2018 showed normal flow on the right, mild occlusive disease on the left. Carotid Doppler obtained on 11/26/2016 showed > 50% left mid common carotid artery stenosis, known left internal carotid occlusion, right internal carotid artery suggestive 40-59% stenosis. She was last seen by me in 2017. More recently seen by Almyra Deforest PA-C in 2020. Myoview at that time showed normal perfusion. EF by Myoview and Echo was mildly impaired with EF 45-50%.   She was admitted in November 2020 with AKI due to high output ileostomy and ARB. In January 2021 she was admitted with acute respiratory failure and encephalopathy related to Covid 19 PNA. She did have  bowel resection x 3 for bowel obstruction 2 years ago.   Was seen by Coletta Memos NP in May 2022 and was doing well.   She is status post exploratory laparotomy, lysis of adhesions, end ileostomy takedown, small bowel resection and ileocolonic anastomosis on 03/23/21 in [place].  On follow up today she notes is doing well from a cardiac standpoint. No chest pain. She has chronic diarrhea since her surgery. Followed by Dr Fuller Plan. EGD in Jan showed diffuse gastric inflammation. She is now seeing  Nephrology. Followed by Dr Virl Cagey with [facility]. LE dopplers in Dec looked good with patent stent. Carotid dopplers in Jan show chronic occlusion of the left ICA. 60-79% RICA stenosis. Fortunately she did quit smoking after her bowel surgery. She does complain of leg cramps.    Past Medical History:  Diagnosis Date   Allergy    Anemia    past hx per pt    Anxiety    Arthritis    back    Arthropathy, unspecified, site unspecified    Blind loop syndrome    Carotid artery disease ([place])    documented occlusion of left ICA   Cataract    removed both eyes    Clotting disorder (HCC)    Hx Clot in leg per pt    Colostomy present [facility])    Coronary artery disease    a. known 60-70% mid-LAD stenosis with caths in 1999, 2002, 2006, and 2012 showing stable anatomy b. 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[place])    Diabetes mellitus    Diverticulosis    Dizziness    chronic   Dyslipidemia    Dyspnea    with exertion   Fatty liver    GERD (gastroesophageal reflux disease)    hiatial hernia   Heart murmur    Hepatomegaly    Hiatal hernia    HTN (hypertension)    Hyperlipidemia    Hypertension    Irritable bowel syndrome    Melanosis coli  Metatarsal fracture right   Normal nuclear stress test    nml 01/03/10;06/19/07   Other, mixed, or unspecified nondependent drug abuse, unspecified    Pancreatitis, chronic (HCC)    Peripheral vascular disease ([place]) 02/20/10   s/p stent of left SFA   Seizure ([place]) 11/04/2018   last 1 yr 2020 per pt unsure month    Sleep apnea    no cpap    Stroke [facility])    Thyroid nodule    Unspecified essential hypertension    Vitamin B12 deficiency     Past Surgical History:  Procedure Laterality Date   ABDOMINAL HYSTERECTOMY     partial   AUGMENTATION MAMMAPLASTY     bilateral 1976 retro    BREAST ENHANCEMENT SURGERY     CARDIAC CATHETERIZATION  07/07/98;08/31/01;03/04/05   60 -70% LAD   CATARACT EXTRACTION Bilateral    COLON  RESECTION N/A 10/14/2017   Procedure: EXPLORATORY LAPAROTOMY, EXTENDED RIGHT COLECTOMY; APPLICATION OF ABDOMINAL VACUUM DRESSING;  Surgeon: Jovita Kussmaul, MD;  Location: [facility];  Service: General;  Laterality: N/A;   COLONOSCOPY     ENDOBRONCHIAL ULTRASOUND Bilateral 02/24/2017   Procedure: ENDOBRONCHIAL ULTRASOUND;  Surgeon: Collene Gobble, MD;  Location: [facility];  Service: Cardiopulmonary;  Laterality: Bilateral;   FEMORAL ARTERY STENT     Left leg   LAPAROTOMY N/A 10/16/2017   Procedure: EXPLORATORY LAPAROTOMY, PARTIAL OMENTECTOMY, RESECTION ISCHEMIC ILEUM 626RS, APPLICATION OF VAC ABDOMINAL DRESSING;  Surgeon: Armandina Gemma, MD;  Location: [facility];  Service: General;  Laterality: N/A;   LAPAROTOMY N/A 10/18/2017   Procedure: RE-EXPLORATION OF ABDOMEN, ILEOSTOMY CREATION;  Surgeon: Stark Klein, MD;  Location: [facility];  Service: General;  Laterality: N/A;   LUNG SURGERY  03/2017   Benign polyps removed   PATELLA REALIGNMENT Left    POLYPECTOMY     SIGMOIDOSCOPY     TONSILLECTOMY      Current Medications: Outpatient Medications Prior to Visit  Medication Sig Dispense Refill   acetaminophen (TYLENOL) 325 MG tablet Take 2 tablets by mouth as needed.     albuterol (VENTOLIN HFA) 108 (90 Base) MCG/ACT inhaler Inhale 1-2 puffs into the lungs as needed.     amLODipine (NORVASC) 10 MG tablet Take 10 mg by mouth daily.     ARIPiprazole (ABILIFY) 5 MG tablet Take 5 mg by mouth daily.     aspirin 81 MG EC tablet Take 1 tablet by mouth as needed.     atorvastatin (LIPITOR) 20 MG tablet Take 20 mg by mouth daily.     benzonatate (TESSALON) 100 MG capsule Take 100 mg by mouth as needed.     cholestyramine (QUESTRAN) 4 g packet Take 1 packet (4 g total) by mouth 2 (two) times daily. 60 each 11   clopidogrel (PLAVIX) 75 MG tablet Take 1 tablet by mouth daily.     [employer] DIGESTIVE PROBIOTIC 250 MG capsule Take 500 mg by mouth daily.     cyclobenzaprine (FLEXERIL) 10 MG tablet Take 0.5 tablets (5  mg total) by mouth 3 (three) times daily as needed for muscle spasms. 30 tablet 0   diphenoxylate-atropine (LOMOTIL) 2.5-0.025 MG tablet Take 1 tablet by mouth 3 (three) times daily. 90 tablet 1   ferrous sulfate 220 (44 Fe) MG/5ML solution Take 5 mLs (220 mg total) by mouth 2 (two) times daily with a meal. 150 mL 3   fluticasone (FLONASE) 50 MCG/ACT nasal spray Place 2 sprays into both nostrils as needed.     furosemide (LASIX)  20 MG tablet Take 20 mg by mouth daily.     HYDROcodone-acetaminophen (NORCO) 10-325 MG tablet Take 1 tablet by mouth 4 (four) times daily as needed.     levETIRAcetam (KEPPRA) 500 MG tablet TAKE 1 TABLET BY MOUTH TWICE A DAY 180 tablet 2   loperamide (IMODIUM) 2 MG capsule Take 2 mg by mouth daily as needed for diarrhea or loose stools.      losartan (COZAAR) 100 MG tablet TAKE 1/2 A TABLET BY MOUTH ONCE DAILY 45 tablet 8   magnesium oxide (MAG-OX) 400 MG tablet Take 1 tablet by mouth daily.     metoprolol tartrate (LOPRESSOR) 100 MG tablet Take 1 tablet (100 mg total) by mouth 2 (two) times daily. 60 tablet 0   mirtazapine (REMERON) 15 MG tablet Take 15 mg by mouth at bedtime.     montelukast (SINGULAIR) 10 MG tablet Take 10 mg by mouth daily.     nitroGLYCERIN (NITROSTAT) 0.4 MG SL tablet Place 1 tablet (0.4 mg total) under the tongue every 5 (five) minutes as needed for chest pain. 90 tablet 3   ondansetron (ZOFRAN) 4 MG tablet Take 1 tablet (4 mg total) by mouth every 6 (six) hours as needed for nausea or vomiting. 40 tablet 1   ondansetron (ZOFRAN-ODT) 4 MG disintegrating tablet Dissolve 1 tablet on tongue twice a day for 7 days then decrease to 1 tablet every 6-8 hours as needed. 30 tablet 0   Pancrelipase, Lip-Prot-Amyl, (ZENPEP) 40000-126000 units CPEP Take 2 capsules with meals and one capsule with snacks. 240 capsule 0   pantoprazole (PROTONIX) 40 MG tablet Take 1 tablet (40 mg total) by mouth 2 (two) times daily. 60 tablet 3   SODIUM FLUORIDE 5000 PPM 1.1 % PSTE  SMARTSIG:Sparingly By Mouth Twice Daily     traMADol (ULTRAM) 50 MG tablet Take 50 mg by mouth 3 (three) times daily as needed for moderate pain.     venlafaxine XR (EFFEXOR-XR) 75 MG 24 hr capsule Take 1 capsule (75 mg total) by mouth daily.     vitamin B-12 (CYANOCOBALAMIN) 500 MCG tablet Take 500 mcg by mouth daily.     Vitamin D, Ergocalciferol, (DRISDOL) 1.25 MG (50000 UT) CAPS capsule Take 50,000 Units by mouth every Wednesday.      clopidogrel (PLAVIX) 75 MG tablet Take 75 mg by mouth daily.     No facility-administered medications prior to visit.     Allergies:   Adhesive [tape], Other, Ace inhibitors, Sulfa antibiotics, Topiramate, Codeine, and Iodinated contrast media   Social History   Socioeconomic History   Marital status: Divorced    Spouse name: Not on file   Number of children: 2   Years of education: Not on file   Highest education level: Not on file  Occupational History   Occupation: retired    Fish farm manager: DISABLED  Tobacco Use   Smoking status: Former    Packs/day: 1.00    Years: 50.00    Pack years: 50.00    Types: Cigarettes    Quit date: 10/14/2010    Years since quitting: 11.1   Smokeless tobacco: Never  Vaping Use   Vaping Use: Never used  Substance and Sexual Activity   Alcohol use: No   Drug use: No   Sexual activity: Not on file  Other Topics Concern   Not on file  Social History Narrative   Not on file   Social Determinants of Health   Financial Resource Strain: Not on file  Food Insecurity: Not on file  Transportation Needs: Not on file  Physical Activity: Not on file  Stress: Not on file  Social Connections: Not on file     Family History:  The patient's family history includes Colon cancer in her paternal grandfather; Hypertension in her father and mother; Osteoarthritis in her mother; Pulmonary embolism in her mother; Stroke in her father and another family member.   ROS:   Please see the history of present illness.    ROS All  other systems reviewed and are negative.   PHYSICAL EXAM:   VS:  BP 128/74    Pulse 67    Ht 5\' 3"  (1.6 m)    Wt 162 lb 6.4 oz (73.7 kg)    BMI 28.77 kg/m    GEN: Well nourished, obese, in no acute distress  HEENT: normal  Neck: no JVD, carotid bruits, or masses Cardiac: RRR; no murmurs, rubs, or gallops,no edema  Respiratory:  clear to auscultation bilaterally, normal work of breathing GI: soft, nontender, nondistended, + BS MS: no deformity or atrophy  Skin: warm and dry, no rash, 1+ edema Neuro:  Alert and Oriented x 3, Strength and sensation are intact Psych: euthymic mood, full affect  Wt Readings from Last 3 Encounters:  12/11/21 162 lb 6.4 oz (73.7 kg)  11/01/21 160 lb (72.6 kg)  10/24/21 160 lb 3.2 oz (72.7 kg)      Studies/Labs Reviewed:   EKG:  EKG is not ordered today.    Recent Labs: 10/24/2021: ALT 13; BUN 17; Creatinine, Ser 1.62; Hemoglobin 12.1; Platelets 239.0; Potassium 4.4; Sodium 137   Lipid Panel    Component Value Date/Time   CHOL 106 10/25/2017 0440   TRIG 215 (H) 10/15/2019 2123   HDL 18 (L) 10/25/2017 0440   CHOLHDL 5.9 10/25/2017 0440   VLDL 40 10/25/2017 0440   LDLCALC 48 10/25/2017 0440   Dated 05/08/20: creatinine 1.9. otherwise CMET normal. A1c 5.6%.  Dated 09/05/21: cholesterol 107, triglycerides 163, HDL 45, LDL 29. A1c 5.4%.   Ecg today shows NSR rate 67. Nonspecific TWA. I have personally reviewed and interpreted this study.   Additional studies/ records that were reviewed today include:   Echo 10/25/2017   LV EF: 55% -   60%   ------------------------------------------------------------------- Indications:      CVA 4.   ------------------------------------------------------------------- History:   PMH:   Coronary artery disease.  Risk factors: Hypertension. Diabetes mellitus. Dyslipidemia.   ------------------------------------------------------------------- Study Conclusions   - Left ventricle: The cavity size was  normal. Wall thickness was   normal. Systolic function was normal. The estimated ejection   fraction was in the range of 55% to 60%. Although no diagnostic   regional wall motion abnormality was identified, this possibility   cannot be completely excluded on the basis of this study. Doppler   parameters are consistent with abnormal left ventricular   relaxation (grade 1 diastolic dysfunction). - Aortic valve: Poorly visualized. There was no stenosis. - Mitral valve: Mildly calcified annulus. There was trivial   regurgitation. - Right ventricle: The cavity size was normal. Systolic function   was normal. - Pulmonary arteries: No complete TR doppler jet so unable to   estimate PA systolic pressure. - Inferior vena cava: The vessel was normal in size. The   respirophasic diameter changes were in the normal range (>= 50%),   consistent with normal central venous pressure.   Impressions:   - Normal LV size with EF 55-60%. Normal RV size and  systolic   function. No significant valvular abnormalities.  Myoview 12/18/18: Study Highlights    The left ventricular ejection fraction is mildly decreased (45-54%). Nuclear stress EF: 48%. No T wave inversion was noted during stress. There was no ST segment deviation noted during stress. This is an intermediate risk study.   No reversible ischemia. LVEF 48% with basal septal dyskinesis. This is an intermediate risk study (LVEF<49%) - recommend correlating findings with echo.   Echo 04/30/19:IMPRESSIONS     1. The left ventricle has mildly reduced systolic function, with an  ejection fraction of 45-50%. The cavity size was normal. There is mildly  increased left ventricular wall thickness. Left ventricular diastolic  Doppler parameters are consistent with  impaired relaxation. Indeterminate filling pressures The E/e' is 8-15.   2. Moderate hypokinesis of the left ventricular, basal-mid inferolateral  wall.   3. The right ventricle has normal  systolic function. The cavity was  normal. There is no increase in right ventricular wall thickness.   4. The mitral valve is abnormal. Mild thickening of the mitral valve  leaflet. Mild calcification of the mitral valve leaflet. There is mild  mitral annular calcification present.   5. The tricuspid valve is grossly normal.   6. The aortic valve is grossly normal. No stenosis of the aortic valve.   7. The aortic root and ascending aorta are normal in size and structure.   8. The inferior vena cava was normal in size with <50% respiratory  variability.   9. The average left ventricular global longitudinal strain is -18.6 %.  10. When compared to the prior study: 10/25/17 EF 55-60%.     Echocardiogram 11/17/2020 IMPRESSIONS     1. Left ventricular ejection fraction, by estimation, is 45 to 50%. The  left ventricle has mildly decreased function. The left ventricle  demonstrates regional wall motion abnormalities (see scoring  diagram/findings for description). There is moderate  left ventricular hypertrophy.   2. The mitral valve was not well visualized. No evidence of mitral valve  regurgitation.   3. The aortic valve is tricuspid. Mild aortic valve sclerosis is present,  with no evidence of aortic valve stenosis.   Comparison(s): Prior images reviewed side by side. Changes from prior  study are noted.   ASSESSMENT:    1. PAOD (peripheral arterial occlusive disease) ([place])   2. Coronary artery disease involving native coronary artery of native heart without angina pectoris   3. Right-sided extracranial carotid artery stenosis   4. Essential hypertension   5. Mixed hyperlipidemia      PLAN:  In order of problems listed above:  1. CAD: She has a known 53 to 70% stenosis in the LAD on multiple previous cardiac catheterizations.  Myoview in March 2020 showed normal perfusion. No angina. Continue beta blocker, antiplatelet therapy, statin, amlodipine.  2. Hypertension: Blood  pressure is well controlled on metoprolol, amlodipine, losartan  3. Bilateral carotid artery disease: 60-79% RICA stenosis. totally occluded left carotid artery.  Followed closely by Dr Virl Cagey at [facility].   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   5. History of ischemic bowel with obstruction. s/p resection and ileostomy with subsequent reanastomosis.   6. OSA- intolerant of CPAP  7. PAD. LE arterial dopplers in Dec looked good.   8. Dyslipidemia. On statin. Excellent control.   Follow up in one year.  Medication Adjustments/Labs and Tests Ordered: Current medicines are reviewed at length with the patient today.  Concerns regarding medicines are outlined above.  Medication changes, Labs and Tests ordered today are listed in the Patient Instructions below. There are no Patient Instructions on file for this visit.   Signed, Cully Luckow Martinique, MD  12/11/2021 4:42 PM    [address], [place], [place]  [address] Phone: [phone]; Fax: [phone]

## 2021-12-10 ENCOUNTER — Other Ambulatory Visit: Payer: Self-pay | Admitting: Neurology

## 2021-12-11 ENCOUNTER — Other Ambulatory Visit: Payer: Self-pay

## 2021-12-11 ENCOUNTER — Encounter: Payer: Self-pay | Admitting: Cardiology

## 2021-12-11 ENCOUNTER — Ambulatory Visit (INDEPENDENT_AMBULATORY_CARE_PROVIDER_SITE_OTHER): Payer: Medicare HMO | Admitting: Cardiology

## 2021-12-11 VITALS — BP 128/74 | HR 67 | Ht 63.0 in | Wt 162.4 lb

## 2021-12-11 DIAGNOSIS — I779 Disorder of arteries and arterioles, unspecified: Secondary | ICD-10-CM | POA: Diagnosis not present

## 2021-12-11 DIAGNOSIS — I2510 Atherosclerotic heart disease of native coronary artery without angina pectoris: Secondary | ICD-10-CM | POA: Diagnosis not present

## 2021-12-11 DIAGNOSIS — I6521 Occlusion and stenosis of right carotid artery: Secondary | ICD-10-CM | POA: Diagnosis not present

## 2021-12-11 DIAGNOSIS — I10 Essential (primary) hypertension: Secondary | ICD-10-CM | POA: Diagnosis not present

## 2021-12-11 DIAGNOSIS — E782 Mixed hyperlipidemia: Secondary | ICD-10-CM | POA: Diagnosis not present

## 2021-12-20 DIAGNOSIS — R5382 Chronic fatigue, unspecified: Secondary | ICD-10-CM | POA: Diagnosis not present

## 2021-12-20 DIAGNOSIS — M48062 Spinal stenosis, lumbar region with neurogenic claudication: Secondary | ICD-10-CM | POA: Diagnosis not present

## 2021-12-20 DIAGNOSIS — K148 Other diseases of tongue: Secondary | ICD-10-CM | POA: Diagnosis not present

## 2021-12-20 DIAGNOSIS — E1151 Type 2 diabetes mellitus with diabetic peripheral angiopathy without gangrene: Secondary | ICD-10-CM | POA: Diagnosis not present

## 2021-12-20 DIAGNOSIS — J439 Emphysema, unspecified: Secondary | ICD-10-CM | POA: Diagnosis not present

## 2021-12-20 DIAGNOSIS — I2510 Atherosclerotic heart disease of native coronary artery without angina pectoris: Secondary | ICD-10-CM | POA: Diagnosis not present

## 2021-12-20 DIAGNOSIS — I130 Hypertensive heart and chronic kidney disease with heart failure and stage 1 through stage 4 chronic kidney disease, or unspecified chronic kidney disease: Secondary | ICD-10-CM | POA: Diagnosis not present

## 2021-12-20 DIAGNOSIS — I679 Cerebrovascular disease, unspecified: Secondary | ICD-10-CM | POA: Diagnosis not present

## 2021-12-20 DIAGNOSIS — E662 Morbid (severe) obesity with alveolar hypoventilation: Secondary | ICD-10-CM | POA: Diagnosis not present

## 2021-12-22 ENCOUNTER — Other Ambulatory Visit: Payer: Self-pay | Admitting: Neurology

## 2021-12-25 ENCOUNTER — Emergency Department (HOSPITAL_COMMUNITY): Payer: Medicare HMO

## 2021-12-25 ENCOUNTER — Inpatient Hospital Stay (HOSPITAL_COMMUNITY)
Admission: EM | Admit: 2021-12-25 | Discharge: 2021-12-28 | DRG: 871 | Disposition: A | Discharge status: Skilled Nursing Facility | Payer: Medicare HMO | Attending: Internal Medicine | Admitting: Internal Medicine

## 2021-12-25 ENCOUNTER — Encounter (HOSPITAL_COMMUNITY): Payer: Self-pay | Admitting: Emergency Medicine

## 2021-12-25 ENCOUNTER — Other Ambulatory Visit: Payer: Self-pay

## 2021-12-25 DIAGNOSIS — N184 Chronic kidney disease, stage 4 (severe): Secondary | ICD-10-CM

## 2021-12-25 DIAGNOSIS — J449 Chronic obstructive pulmonary disease, unspecified: Secondary | ICD-10-CM | POA: Diagnosis present

## 2021-12-25 DIAGNOSIS — Z9841 Cataract extraction status, right eye: Secondary | ICD-10-CM

## 2021-12-25 DIAGNOSIS — I6522 Occlusion and stenosis of left carotid artery: Secondary | ICD-10-CM | POA: Diagnosis not present

## 2021-12-25 DIAGNOSIS — Z9842 Cataract extraction status, left eye: Secondary | ICD-10-CM

## 2021-12-25 DIAGNOSIS — I6389 Other cerebral infarction: Secondary | ICD-10-CM | POA: Diagnosis not present

## 2021-12-25 DIAGNOSIS — I63512 Cerebral infarction due to unspecified occlusion or stenosis of left middle cerebral artery: Secondary | ICD-10-CM | POA: Diagnosis not present

## 2021-12-25 DIAGNOSIS — K8681 Exocrine pancreatic insufficiency: Secondary | ICD-10-CM | POA: Diagnosis present

## 2021-12-25 DIAGNOSIS — I6602 Occlusion and stenosis of left middle cerebral artery: Secondary | ICD-10-CM | POA: Diagnosis not present

## 2021-12-25 DIAGNOSIS — R471 Dysarthria and anarthria: Secondary | ICD-10-CM | POA: Diagnosis present

## 2021-12-25 DIAGNOSIS — E538 Deficiency of other specified B group vitamins: Secondary | ICD-10-CM | POA: Diagnosis present

## 2021-12-25 DIAGNOSIS — Z79899 Other long term (current) drug therapy: Secondary | ICD-10-CM

## 2021-12-25 DIAGNOSIS — K589 Irritable bowel syndrome without diarrhea: Secondary | ICD-10-CM | POA: Diagnosis present

## 2021-12-25 DIAGNOSIS — N179 Acute kidney failure, unspecified: Secondary | ICD-10-CM

## 2021-12-25 DIAGNOSIS — Z8249 Family history of ischemic heart disease and other diseases of the circulatory system: Secondary | ICD-10-CM

## 2021-12-25 DIAGNOSIS — I6381 Other cerebral infarction due to occlusion or stenosis of small artery: Secondary | ICD-10-CM

## 2021-12-25 DIAGNOSIS — Z7902 Long term (current) use of antithrombotics/antiplatelets: Secondary | ICD-10-CM

## 2021-12-25 DIAGNOSIS — Z8719 Personal history of other diseases of the digestive system: Secondary | ICD-10-CM

## 2021-12-25 DIAGNOSIS — J189 Pneumonia, unspecified organism: Secondary | ICD-10-CM

## 2021-12-25 DIAGNOSIS — Z882 Allergy status to sulfonamides status: Secondary | ICD-10-CM

## 2021-12-25 DIAGNOSIS — A419 Sepsis, unspecified organism: Principal | ICD-10-CM

## 2021-12-25 DIAGNOSIS — R4701 Aphasia: Secondary | ICD-10-CM

## 2021-12-25 DIAGNOSIS — I2510 Atherosclerotic heart disease of native coronary artery without angina pectoris: Secondary | ICD-10-CM | POA: Diagnosis present

## 2021-12-25 DIAGNOSIS — J690 Pneumonitis due to inhalation of food and vomit: Secondary | ICD-10-CM | POA: Diagnosis not present

## 2021-12-25 DIAGNOSIS — R278 Other lack of coordination: Secondary | ICD-10-CM | POA: Diagnosis not present

## 2021-12-25 DIAGNOSIS — G9341 Metabolic encephalopathy: Secondary | ICD-10-CM | POA: Diagnosis present

## 2021-12-25 DIAGNOSIS — R29810 Facial weakness: Secondary | ICD-10-CM | POA: Diagnosis present

## 2021-12-25 DIAGNOSIS — K219 Gastro-esophageal reflux disease without esophagitis: Secondary | ICD-10-CM | POA: Diagnosis present

## 2021-12-25 DIAGNOSIS — R400 Somnolence: Secondary | ICD-10-CM | POA: Diagnosis not present

## 2021-12-25 DIAGNOSIS — R4182 Altered mental status, unspecified: Secondary | ICD-10-CM | POA: Diagnosis not present

## 2021-12-25 DIAGNOSIS — G9340 Encephalopathy, unspecified: Secondary | ICD-10-CM | POA: Diagnosis not present

## 2021-12-25 DIAGNOSIS — Z888 Allergy status to other drugs, medicaments and biological substances status: Secondary | ICD-10-CM

## 2021-12-25 DIAGNOSIS — N390 Urinary tract infection, site not specified: Secondary | ICD-10-CM | POA: Diagnosis present

## 2021-12-25 DIAGNOSIS — E785 Hyperlipidemia, unspecified: Secondary | ICD-10-CM | POA: Diagnosis present

## 2021-12-25 DIAGNOSIS — I639 Cerebral infarction, unspecified: Secondary | ICD-10-CM

## 2021-12-25 DIAGNOSIS — M6259 Muscle wasting and atrophy, not elsewhere classified, multiple sites: Secondary | ICD-10-CM | POA: Diagnosis not present

## 2021-12-25 DIAGNOSIS — B961 Klebsiella pneumoniae [K. pneumoniae] as the cause of diseases classified elsewhere: Secondary | ICD-10-CM | POA: Diagnosis present

## 2021-12-25 DIAGNOSIS — R2681 Unsteadiness on feet: Secondary | ICD-10-CM | POA: Diagnosis not present

## 2021-12-25 DIAGNOSIS — R29702 NIHSS score 2: Secondary | ICD-10-CM | POA: Diagnosis present

## 2021-12-25 DIAGNOSIS — I6613 Occlusion and stenosis of bilateral anterior cerebral arteries: Secondary | ICD-10-CM | POA: Diagnosis not present

## 2021-12-25 DIAGNOSIS — Z87891 Personal history of nicotine dependence: Secondary | ICD-10-CM

## 2021-12-25 DIAGNOSIS — I63231 Cerebral infarction due to unspecified occlusion or stenosis of right carotid arteries: Secondary | ICD-10-CM

## 2021-12-25 DIAGNOSIS — Z823 Family history of stroke: Secondary | ICD-10-CM

## 2021-12-25 DIAGNOSIS — I6523 Occlusion and stenosis of bilateral carotid arteries: Secondary | ICD-10-CM | POA: Diagnosis not present

## 2021-12-25 DIAGNOSIS — Z90710 Acquired absence of both cervix and uterus: Secondary | ICD-10-CM

## 2021-12-25 DIAGNOSIS — I5023 Acute on chronic systolic (congestive) heart failure: Secondary | ICD-10-CM | POA: Diagnosis not present

## 2021-12-25 DIAGNOSIS — E860 Dehydration: Secondary | ICD-10-CM | POA: Diagnosis not present

## 2021-12-25 DIAGNOSIS — Z91048 Other nonmedicinal substance allergy status: Secondary | ICD-10-CM

## 2021-12-25 DIAGNOSIS — Z885 Allergy status to narcotic agent status: Secondary | ICD-10-CM

## 2021-12-25 DIAGNOSIS — G4489 Other headache syndrome: Secondary | ICD-10-CM | POA: Diagnosis not present

## 2021-12-25 DIAGNOSIS — K861 Other chronic pancreatitis: Secondary | ICD-10-CM | POA: Diagnosis present

## 2021-12-25 DIAGNOSIS — R410 Disorientation, unspecified: Secondary | ICD-10-CM | POA: Diagnosis not present

## 2021-12-25 DIAGNOSIS — R6520 Severe sepsis without septic shock: Secondary | ICD-10-CM | POA: Diagnosis not present

## 2021-12-25 DIAGNOSIS — N3000 Acute cystitis without hematuria: Secondary | ICD-10-CM | POA: Diagnosis not present

## 2021-12-25 DIAGNOSIS — Z86718 Personal history of other venous thrombosis and embolism: Secondary | ICD-10-CM

## 2021-12-25 DIAGNOSIS — I63532 Cerebral infarction due to unspecified occlusion or stenosis of left posterior cerebral artery: Secondary | ICD-10-CM | POA: Diagnosis not present

## 2021-12-25 DIAGNOSIS — E1151 Type 2 diabetes mellitus with diabetic peripheral angiopathy without gangrene: Secondary | ICD-10-CM | POA: Diagnosis present

## 2021-12-25 DIAGNOSIS — I10 Essential (primary) hypertension: Secondary | ICD-10-CM | POA: Diagnosis not present

## 2021-12-25 DIAGNOSIS — Z933 Colostomy status: Secondary | ICD-10-CM

## 2021-12-25 DIAGNOSIS — Z800 Family history of malignant neoplasm of digestive organs: Secondary | ICD-10-CM

## 2021-12-25 DIAGNOSIS — I63511 Cerebral infarction due to unspecified occlusion or stenosis of right middle cerebral artery: Secondary | ICD-10-CM | POA: Diagnosis present

## 2021-12-25 DIAGNOSIS — Z20822 Contact with and (suspected) exposure to covid-19: Secondary | ICD-10-CM | POA: Diagnosis present

## 2021-12-25 DIAGNOSIS — I63523 Cerebral infarction due to unspecified occlusion or stenosis of bilateral anterior cerebral arteries: Secondary | ICD-10-CM | POA: Diagnosis not present

## 2021-12-25 DIAGNOSIS — G40909 Epilepsy, unspecified, not intractable, without status epilepticus: Secondary | ICD-10-CM | POA: Diagnosis present

## 2021-12-25 DIAGNOSIS — G894 Chronic pain syndrome: Secondary | ICD-10-CM | POA: Diagnosis present

## 2021-12-25 DIAGNOSIS — Z7982 Long term (current) use of aspirin: Secondary | ICD-10-CM

## 2021-12-25 DIAGNOSIS — E1122 Type 2 diabetes mellitus with diabetic chronic kidney disease: Secondary | ICD-10-CM | POA: Diagnosis present

## 2021-12-25 DIAGNOSIS — I6521 Occlusion and stenosis of right carotid artery: Secondary | ICD-10-CM | POA: Diagnosis not present

## 2021-12-25 DIAGNOSIS — I130 Hypertensive heart and chronic kidney disease with heart failure and stage 1 through stage 4 chronic kidney disease, or unspecified chronic kidney disease: Secondary | ICD-10-CM | POA: Diagnosis present

## 2021-12-25 DIAGNOSIS — Z91041 Radiographic dye allergy status: Secondary | ICD-10-CM

## 2021-12-25 DIAGNOSIS — R569 Unspecified convulsions: Secondary | ICD-10-CM

## 2021-12-25 DIAGNOSIS — Z8673 Personal history of transient ischemic attack (TIA), and cerebral infarction without residual deficits: Secondary | ICD-10-CM

## 2021-12-25 DIAGNOSIS — M6281 Muscle weakness (generalized): Secondary | ICD-10-CM | POA: Diagnosis not present

## 2021-12-25 DIAGNOSIS — R42 Dizziness and giddiness: Secondary | ICD-10-CM | POA: Diagnosis not present

## 2021-12-25 DIAGNOSIS — N170 Acute kidney failure with tubular necrosis: Secondary | ICD-10-CM | POA: Diagnosis not present

## 2021-12-25 LAB — URINALYSIS, ROUTINE W REFLEX MICROSCOPIC
Bilirubin Urine: NEGATIVE
Glucose, UA: NEGATIVE mg/dL
Ketones, ur: NEGATIVE mg/dL
Nitrite: NEGATIVE
Protein, ur: NEGATIVE mg/dL
Specific Gravity, Urine: 1.008 (ref 1.005–1.030)
pH: 5 (ref 5.0–8.0)

## 2021-12-25 LAB — COMPREHENSIVE METABOLIC PANEL
ALT: 16 U/L (ref 0–44)
AST: 28 U/L (ref 15–41)
Albumin: 3.5 g/dL (ref 3.5–5.0)
Alkaline Phosphatase: 87 U/L (ref 38–126)
Anion gap: 10 (ref 5–15)
BUN: 19 mg/dL (ref 8–23)
CO2: 20 mmol/L — ABNORMAL LOW (ref 22–32)
Calcium: 8.9 mg/dL (ref 8.9–10.3)
Chloride: 106 mmol/L (ref 98–111)
Creatinine, Ser: 2.04 mg/dL — ABNORMAL HIGH (ref 0.44–1.00)
GFR, Estimated: 25 mL/min — ABNORMAL LOW (ref >60)
Glucose, Bld: 110 mg/dL — ABNORMAL HIGH (ref 70–99)
Potassium: 5 mmol/L (ref 3.5–5.1)
Sodium: 136 mmol/L (ref 135–145)
Total Bilirubin: 1 mg/dL (ref 0.3–1.2)
Total Protein: 6.6 g/dL (ref 6.5–8.1)

## 2021-12-25 LAB — CBC WITH DIFFERENTIAL/PLATELET
Abs Immature Granulocytes: 0.1 10*3/uL — ABNORMAL HIGH (ref 0.00–0.07)
Basophils Absolute: 0.1 10*3/uL (ref 0.0–0.1)
Basophils Relative: 0 %
Eosinophils Absolute: 0.1 10*3/uL (ref 0.0–0.5)
Eosinophils Relative: 0 %
HCT: 38.7 % (ref 36.0–46.0)
Hemoglobin: 12.6 g/dL (ref 12.0–15.0)
Immature Granulocytes: 1 %
Lymphocytes Relative: 7 %
Lymphs Abs: 1.3 10*3/uL (ref 0.7–4.0)
MCH: 30.9 pg (ref 26.0–34.0)
MCHC: 32.6 g/dL (ref 30.0–36.0)
MCV: 94.9 fL (ref 80.0–100.0)
Monocytes Absolute: 1 10*3/uL (ref 0.1–1.0)
Monocytes Relative: 6 %
Neutro Abs: 16.1 10*3/uL — ABNORMAL HIGH (ref 1.7–7.7)
Neutrophils Relative %: 86 %
Platelets: 227 10*3/uL (ref 150–400)
RBC: 4.08 MIL/uL (ref 3.87–5.11)
RDW: 13.1 % (ref 11.5–15.5)
WBC: 18.6 10*3/uL — ABNORMAL HIGH (ref 4.0–10.5)
nRBC: 0 % (ref 0.0–0.2)

## 2021-12-25 LAB — TSH: TSH: 1.434 u[IU]/mL (ref 0.350–4.500)

## 2021-12-25 LAB — LIPASE, BLOOD: Lipase: 23 U/L (ref 11–51)

## 2021-12-25 LAB — PROTIME-INR
INR: 1.2 (ref 0.8–1.2)
Prothrombin Time: 15.5 seconds — ABNORMAL HIGH (ref 11.4–15.2)

## 2021-12-25 LAB — TROPONIN I (HIGH SENSITIVITY)
Troponin I (High Sensitivity): 26 ng/L — ABNORMAL HIGH (ref <18)
Troponin I (High Sensitivity): 21 ng/L — ABNORMAL HIGH (ref <18)

## 2021-12-25 LAB — LACTIC ACID, PLASMA
Lactic Acid, Venous: 1.7 mmol/L (ref 0.5–1.9)
Lactic Acid, Venous: 1.6 mmol/L (ref 0.5–1.9)

## 2021-12-25 LAB — RESP PANEL BY RT-PCR (FLU A&B, COVID) ARPGX2
Influenza A by PCR: NEGATIVE
Influenza B by PCR: NEGATIVE
SARS Coronavirus 2 by RT PCR: NEGATIVE

## 2021-12-25 LAB — AMMONIA: Ammonia: 11 umol/L (ref 9–35)

## 2021-12-25 LAB — BRAIN NATRIURETIC PEPTIDE: B Natriuretic Peptide: 764.2 pg/mL — ABNORMAL HIGH (ref 0.0–100.0)

## 2021-12-25 LAB — MAGNESIUM: Magnesium: 1.1 mg/dL — ABNORMAL LOW (ref 1.7–2.4)

## 2021-12-25 MED ORDER — MAGNESIUM SULFATE 2 GM/50ML IV SOLN
2.0000 g | Freq: Once | INTRAVENOUS | Status: AC
  Administered 2021-12-25: 2 g via INTRAVENOUS
  Filled 2021-12-25: qty 50

## 2021-12-25 MED ORDER — STROKE: EARLY STAGES OF RECOVERY BOOK
Freq: Once | Status: AC
  Filled 2021-12-25: qty 1

## 2021-12-25 MED ORDER — SODIUM CHLORIDE 0.9 % IV SOLN
1.0000 g | Freq: Once | INTRAVENOUS | Status: AC
  Administered 2021-12-25: 1 g via INTRAVENOUS
  Filled 2021-12-25: qty 10

## 2021-12-25 MED ORDER — ENOXAPARIN SODIUM 30 MG/0.3ML IJ SOSY
30.0000 mg | PREFILLED_SYRINGE | Freq: Every day | INTRAMUSCULAR | Status: DC
  Administered 2021-12-26 – 2021-12-28 (×3): 30 mg via SUBCUTANEOUS
  Filled 2021-12-25 (×3): qty 0.3

## 2021-12-25 MED ORDER — SODIUM CHLORIDE 0.9 % IV SOLN
500.0000 mg | Freq: Once | INTRAVENOUS | Status: AC
  Administered 2021-12-25: 500 mg via INTRAVENOUS
  Filled 2021-12-25: qty 5

## 2021-12-25 MED ORDER — LEVETIRACETAM IN NACL 1000 MG/100ML IV SOLN
1000.0000 mg | Freq: Once | INTRAVENOUS | Status: AC
  Administered 2021-12-26: 1000 mg via INTRAVENOUS
  Filled 2021-12-25: qty 100

## 2021-12-25 MED ORDER — ACETAMINOPHEN 650 MG RE SUPP: 650.0000 mg | RECTAL | Status: DC | PRN

## 2021-12-25 MED ORDER — LEVETIRACETAM 500 MG PO TABS
500.0000 mg | ORAL_TABLET | Freq: Two times a day (BID) | ORAL | Status: DC
  Administered 2021-12-25 – 2021-12-28 (×6): 500 mg via ORAL
  Filled 2021-12-25 (×6): qty 1

## 2021-12-25 MED ORDER — ACETAMINOPHEN 325 MG PO TABS
650.0000 mg | ORAL_TABLET | ORAL | Status: DC | PRN
  Administered 2021-12-26 – 2021-12-28 (×5): 650 mg via ORAL
  Filled 2021-12-25 (×5): qty 2

## 2021-12-25 MED ORDER — ACETAMINOPHEN 160 MG/5ML PO SOLN: 650.0000 mg | ORAL | Status: DC | PRN

## 2021-12-25 NOTE — Consult Note (Addendum)
NEUROLOGY CONSULTATION NOTE  ? ?Date of service: December 25, 2021 ?Patient Name: [name] ?MRN:  [id number] ?DOB:  1945/12/01 ?Reason for consult: "R caudate infarct" ?Requesting Provider: Orene Desanctis, DO ?_ _ _   _ __   _ __ _ _  __ __   _ __   __ _ ? ?History of Present Illness  ?[name] is a 76 y.o. female with PMH significant for CAD, HLD, HTN, diverticulosis, peripheral vascular disease, seizure on Keppra '500mg'$  BID, chronic left parietal lobe stroke, known chronic L ICA occlusion. She presents to the ED with encephalopathy and concern for aphasia out of proportion to her encephalopathy. ? ?She was seen repeating herself in the ED, getting stuck on words. On my evaluation, patient has no idea why she is in the hospital. Reports that she was doing fine. Feels confused today. ? ?She had workup with [facility] concerning for a ? UTI, CXR concerning for R lung Pneumonia. She had MRI Brain which demonstrated a R caudate stroke. ? ?I spoke to daughter [name] with patient's permission. Daughter reports that today out of nowhere, she was not making sense and seemed confused and so she called paramedics. This seems like it happened abruptly. She seemed fine when daughter saw her yesterday. ? ?Per daughter, she ran out of her Keppra and is also out of several other meds. She has not taken Keppra in 2 weeks. ? ?LKW: 1000 on 12/24/21 ?mRS: 1 ?tNKASE: outsid ewindow ?Thrombectomy: not consistent with LVO ?NIHSS components Score: Comment  ?1a Level of Conscious 0'[x]'$  1'[]'$  2'[]'$  3'[]'$      ?1b LOC Questions 0'[]'$  1'[x]'$  2'[]'$       ?1c LOC Commands 0'[x]'$  1'[]'$  2'[]'$       ?2 Best Gaze 0'[x]'$  1'[]'$  2'[]'$       ?3 Visual 0'[x]'$  1'[]'$  2'[]'$  3'[]'$      ?4 Facial Palsy 0'[x]'$  1'[]'$  2'[]'$  3'[]'$      ?5a Motor Arm - left 0'[x]'$  1'[]'$  2'[]'$  3'[]'$  4'[]'$  UN'[]'$    ?5b Motor Arm - Right 0'[x]'$  1'[]'$  2'[]'$  3'[]'$  4'[]'$  UN'[]'$    ?6a Motor Leg - Left 0'[x]'$  1'[]'$  2'[]'$  3'[]'$  4'[]'$  UN'[]'$    ?6b Motor Leg - Right 0'[x]'$  1'[]'$  2'[]'$  3'[]'$  4'[]'$  UN'[]'$    ?7 Limb Ataxia 0'[x]'$  1'[]'$  2'[]'$  3'[]'$  UN'[]'$     ?8 Sensory 0'[]'$  1'[x]'$  2'[]'$  UN'[]'$      ?9 Best  Language 0'[x]'$  1'[]'$  2'[]'$  3'[]'$      ?10 Dysarthria 0'[x]'$  1'[]'$  2'[]'$  UN'[]'$      ?11 Extinct. and Inattention 0'[x]'$  1'[]'$  2'[]'$       ?TOTAL: 2   ? ?  ?ROS  ? ?Constitutional Denies weight loss, fever and chills.   ?HEENT Denies changes in vision and hearing.   ?Respiratory Denies SOB and cough.   ?CV Denies palpitations and CP   ?GI Denies abdominal pain, nausea, vomiting and diarrhea.   ?GU Denies dysuria and urinary frequency.   ?MSK Denies myalgia and joint pain.   ?Skin Denies rash and pruritus.   ?Neurological Denies headache and syncope.   ?Psychiatric Denies recent changes in mood. Denies anxiety and depression.   ? ?Past History  ? ?Past Medical History:  ?Diagnosis Date  ? Allergy   ? Anemia   ? past hx per pt   ? Anxiety   ? Arthritis   ? back   ? Arthropathy, unspecified, site unspecified   ? Blind loop syndrome   ? Carotid artery disease ([place])   ? documented occlusion of left ICA  ?  Cataract   ? removed both eyes   ? Clotting disorder ([place])   ? Hx Clot in leg per pt   ? Colostomy present ([place])   ? Coronary artery disease   ? a. known 60-70% mid-LAD stenosis with caths in 1999, 2002, 2006, and 2012 showing stable anatomy b. low-risk NST in 10/2015  ? Depression   ? Diabetes insipidus ([place])   ? Diabetes mellitus   ? Diverticulosis   ? Dizziness   ? chronic  ? Dyslipidemia   ? Dyspnea   ? with exertion  ? Fatty liver   ? GERD (gastroesophageal reflux disease)   ? hiatial hernia  ? Heart murmur   ? Hepatomegaly   ? Hiatal hernia   ? HTN (hypertension)   ? Hyperlipidemia   ? Hypertension   ? Irritable bowel syndrome   ? Melanosis coli   ? Metatarsal fracture right  ? Normal nuclear stress test   ? nml 01/03/10;06/19/07  ? Other, mixed, or unspecified nondependent drug abuse, unspecified   ? Pancreatitis, chronic ([place])   ? Peripheral vascular disease ([place]) 02/20/10  ? s/p stent of left SFA  ? Seizure ([place]) 11/04/2018  ? last 1 yr 2020 per pt unsure month   ? Sleep apnea   ? no cpap   ? Stroke [facility])   ? Thyroid nodule   ? Unspecified  essential hypertension   ? Vitamin B12 deficiency   ? ?Past Surgical History:  ?Procedure Laterality Date  ? ABDOMINAL HYSTERECTOMY    ? partial  ? AUGMENTATION MAMMAPLASTY    ? bilateral 1976 retro   ? BREAST ENHANCEMENT SURGERY    ? CARDIAC CATHETERIZATION  07/07/98;08/31/01;03/04/05  ? 60 -70% LAD  ? CATARACT EXTRACTION Bilateral   ? COLON RESECTION N/A 10/14/2017  ? Procedure: EXPLORATORY LAPAROTOMY, EXTENDED RIGHT COLECTOMY; APPLICATION OF ABDOMINAL VACUUM DRESSING;  Surgeon: Jovita Kussmaul, MD;  Location: [facility];  Service: General;  Laterality: N/A;  ? COLONOSCOPY    ? ENDOBRONCHIAL ULTRASOUND Bilateral 02/24/2017  ? Procedure: ENDOBRONCHIAL ULTRASOUND;  Surgeon: Collene Gobble, MD;  Location: [facility];  Service: Cardiopulmonary;  Laterality: Bilateral;  ? FEMORAL ARTERY STENT    ? Left leg  ? LAPAROTOMY N/A 10/16/2017  ? Procedure: EXPLORATORY LAPAROTOMY, PARTIAL OMENTECTOMY, RESECTION ISCHEMIC ILEUM 741UL, APPLICATION OF VAC ABDOMINAL DRESSING;  Surgeon: Armandina Gemma, MD;  Location: [facility];  Service: General;  Laterality: N/A;  ? LAPAROTOMY N/A 10/18/2017  ? Procedure: RE-EXPLORATION OF ABDOMEN, ILEOSTOMY CREATION;  Surgeon: Stark Klein, MD;  Location: [facility];  Service: General;  Laterality: N/A;  ? LUNG SURGERY  03/2017  ? Benign polyps removed  ? PATELLA REALIGNMENT Left   ? POLYPECTOMY    ? SIGMOIDOSCOPY    ? TONSILLECTOMY    ? ?Family History  ?Problem Relation Age of Onset  ? Hypertension Mother   ? Osteoarthritis Mother   ? Pulmonary embolism Mother   ? Hypertension Father   ?     aneurysm  ? Stroke Father   ? Colon cancer Paternal Grandfather   ? Stroke Other   ? Breast cancer Neg Hx   ? Colon polyps Neg Hx   ? Esophageal cancer Neg Hx   ? Rectal cancer Neg Hx   ? Stomach cancer Neg Hx   ? Pancreatic cancer Neg Hx   ? Liver disease Neg Hx   ? ?Social History  ? ?Socioeconomic History  ? Marital status: Divorced  ?  Spouse name: Not on file  ?  Number of children: 2  ? Years of education: Not on  file  ? Highest education level: Not on file  ?Occupational History  ? Occupation: retired  ?  Employer: DISABLED  ?Tobacco Use  ? Smoking status: Former  ?  Packs/day: 1.00  ?  Years: 50.00  ?  Pack years: 50.00  ?  Types: Cigarettes  ?  Quit date: 10/14/2010  ?  Years since quitting: 11.2  ? Smokeless tobacco: Never  ?Vaping Use  ? Vaping Use: Never used  ?Substance and Sexual Activity  ? Alcohol use: No  ? Drug use: No  ? Sexual activity: Not on file  ?Other Topics Concern  ? Not on file  ?Social History Narrative  ? Not on file  ? ?Social Determinants of Health  ? ?Financial Resource Strain: Not on file  ?Food Insecurity: Not on file  ?Transportation Needs: Not on file  ?Physical Activity: Not on file  ?Stress: Not on file  ?Social Connections: Not on file  ? ?Allergies  ?Allergen Reactions  ? Adhesive [Tape] Other (See Comments)  ?  "Took off my skin"  ? Other Rash  ?  "Took off my skin"  ? Ace Inhibitors Other (See Comments)  ?  Other reaction(s): Unknown  ? Sulfa Antibiotics   ?  Other reaction(s): Unknown  ? Topiramate Other (See Comments)  ? Codeine Other (See Comments)  ?  Altered mental state   ? Iodinated Contrast Media Rash  ? ? ?Medications  ?(Not in a hospital admission) ?  ? ?Vitals  ? ?Vitals:  ? 12/25/21 1900 12/25/21 1930 12/25/21 2115 12/25/21 2215  ?BP: (!) 169/63 (!) 164/55 (!) 180/100 (!) 182/75  ?Pulse: 93 95 95 100  ?Resp: '20 15 15 15  '$ ?Temp:      ?TempSrc:      ?SpO2: 96% 95% 96% 94%  ?  ? ?There is no height or weight on file to calculate BMI. ? ?Physical Exam  ? ?General: Laying comfortably in bed; in no acute distress.  ?HENT: Normal oropharynx and mucosa. Normal external appearance of ears and nose.  ?Neck: Supple, no pain or tenderness  ?CV: No JVD. No peripheral edema.  ?Pulmonary: Symmetric Chest rise. Normal respiratory effort.  ?Abdomen: Soft to touch, non-tender.  ?Ext: No cyanosis, edema, or deformity  ?Skin: No rash. Normal palpation of skin.   ?Musculoskeletal: Normal digits and  nails by inspection. No clubbing.  ? ?Neurologic Examination  ?Mental status/Cognition: Alert, oriented to self, place, month but not year, good attention.  ?Speech/language: Fluent, comprehension [place]

## 2021-12-25 NOTE — ED Triage Notes (Signed)
Patient BIB [facility] from home. Patient last seen by family Saturday. EMS called out today for altered mental status. Per family pt with habit of taking too much of of her medication including hydrocodone.  Report that patient has already taken both morning and evening dose of all of her medications today.  ?

## 2021-12-25 NOTE — ED Notes (Signed)
Pt placed on bedpan for urine sample at this time.  ?

## 2021-12-25 NOTE — ED Notes (Signed)
Pt refused 2nd set of BC ?

## 2021-12-25 NOTE — ED Provider Notes (Signed)
?[facility] ?Provider Note ? ? ?CSN: [id number] ?Arrival date & time: 12/25/21  1431 ? ?  ? ?History ? ?Chief Complaint  ?Patient presents with  ? Altered Mental Status  ? Possible Overdose  ? ? ?[name] is a 76 y.o. female. ? ?The history is provided by the patient and medical records. No language interpreter was used.  ?Altered Mental Status ?Presenting symptoms: confusion   ?Presenting symptoms comment:  Fatigue ?Severity:  Moderate ?Most recent episode:  More than 2 days ago ?Episode history:  Continuous ?Timing:  Constant ?Progression:  Unchanged ?Chronicity:  New ?Associated symptoms: headaches (mild), light-headedness and seizures (hx of but none reported)   ?Associated symptoms: no abdominal pain, no agitation, no fever, no nausea, no palpitations, no rash, no visual change, no vomiting and no weakness   ?Associated symptoms comment:  Speech difficulty with aphasia ? ?  ? ?Home Medications ?Prior to Admission medications   ?Medication Sig Start Date End Date Taking? Authorizing Provider  ?acetaminophen (TYLENOL) 325 MG tablet Take 2 tablets by mouth as needed.    [provider]  ?albuterol (VENTOLIN HFA) 108 (90 Base) MCG/ACT inhaler Inhale 1-2 puffs into the lungs as needed. 03/30/21   [provider]  ?amLODipine (NORVASC) 10 MG tablet Take 10 mg by mouth daily.    [provider]  ?ARIPiprazole (ABILIFY) 5 MG tablet Take 5 mg by mouth daily. 03/30/21   [provider]  ?aspirin 81 MG EC tablet Take 1 tablet by mouth as needed. 04/17/21   [provider]  ?atorvastatin (LIPITOR) 20 MG tablet Take 20 mg by mouth daily.    [provider]  ?benzonatate (TESSALON) 100 MG capsule Take 100 mg by mouth as needed. 03/19/21   [provider]  ?cholestyramine (QUESTRAN) 4 g packet Take 1 packet (4 g total) by mouth 2 (two) times daily. 06/08/21   Ladene Artist, MD  ?clopidogrel (PLAVIX) 75 MG tablet Take 1  tablet by mouth daily.    [provider]  ?[employer] DIGESTIVE PROBIOTIC 250 MG capsule Take 500 mg by mouth daily. 06/27/21   [provider]  ?cyclobenzaprine (FLEXERIL) 10 MG tablet Take 0.5 tablets (5 mg total) by mouth 3 (three) times daily as needed for muscle spasms. 11/23/20   Elgergawy, Silver Huguenin, MD  ?diphenoxylate-atropine (LOMOTIL) 2.5-0.025 MG tablet Take 1 tablet by mouth 3 (three) times daily. 08/27/19   Domenic Polite, MD  ?ferrous sulfate 220 (44 Fe) MG/5ML solution Take 5 mLs (220 mg total) by mouth 2 (two) times daily with a meal. 11/23/20   Elgergawy, Silver Huguenin, MD  ?fluticasone (FLONASE) 50 MCG/ACT nasal spray Place 2 sprays into both nostrils as needed. 01/29/16   [provider]  ?furosemide (LASIX) 20 MG tablet Take 20 mg by mouth daily. 08/06/21   [provider]  ?HYDROcodone-acetaminophen (NORCO) 10-325 MG tablet Take 1 tablet by mouth 4 (four) times daily as needed. 07/24/21   [provider]  ?levETIRAcetam (KEPPRA) 500 MG tablet TAKE 1 TABLET BY MOUTH TWICE A DAY 01/20/20   Dohmeier, Asencion Partridge, MD  ?loperamide (IMODIUM) 2 MG capsule Take 2 mg by mouth daily as needed for diarrhea or loose stools.     [provider]  ?losartan (COZAAR) 100 MG tablet TAKE 1/2 A TABLET BY MOUTH ONCE DAILY 10/05/21   [place], Peter M, MD  ?magnesium oxide (MAG-OX) 400 MG tablet Take 1 tablet by mouth daily. 05/08/20   [provider]  ?  metoprolol tartrate (LOPRESSOR) 100 MG tablet Take 1 tablet (100 mg total) by mouth 2 (two) times daily. 11/23/20   Elgergawy, Silver Huguenin, MD  ?mirtazapine (REMERON) 15 MG tablet Take 15 mg by mouth at bedtime. 07/18/21   [provider]  ?montelukast (SINGULAIR) 10 MG tablet Take 10 mg by mouth daily. 08/09/18   [provider]  ?nitroGLYCERIN (NITROSTAT) 0.4 MG SL tablet Place 1 tablet (0.4 mg total) under the tongue every 5 (five) minutes as needed for chest pain. 11/08/15   [place], Peter M, MD  ?ondansetron  (ZOFRAN) 4 MG tablet Take 1 tablet (4 mg total) by mouth every 6 (six) hours as needed for nausea or vomiting. 10/24/21   Esterwood, Amy S, PA-C  ?ondansetron (ZOFRAN-ODT) 4 MG disintegrating tablet Dissolve 1 tablet on tongue twice a day for 7 days then decrease to 1 tablet every 6-8 hours as needed. 11/28/20   Noralyn Pick, NP  ?Pancrelipase, Lip-Prot-Amyl, (ZENPEP) 40000-126000 units CPEP Take 2 capsules with meals and one capsule with snacks. 06/08/21   Ladene Artist, MD  ?pantoprazole (PROTONIX) 40 MG tablet Take 1 tablet (40 mg total) by mouth 2 (two) times daily. 10/24/21   Esterwood, Amy S, PA-C  ?SODIUM FLUORIDE 5000 PPM 1.1 % PSTE SMARTSIG:Sparingly By Mouth Twice Daily 05/16/20   [provider]  ?traMADol (ULTRAM) 50 MG tablet Take 50 mg by mouth 3 (three) times daily as needed for moderate pain. 06/01/20   [provider]  ?venlafaxine XR (EFFEXOR-XR) 75 MG 24 hr capsule Take 1 capsule (75 mg total) by mouth daily. 05/18/20   [provider]  ?vitamin B-12 (CYANOCOBALAMIN) 500 MCG tablet Take 500 mcg by mouth daily.    [provider]  ?Vitamin D, Ergocalciferol, (DRISDOL) 1.25 MG (50000 UT) CAPS capsule Take 50,000 Units by mouth every Wednesday.     [provider]  ?   ? ?Allergies    ?Adhesive [tape], Other, Ace inhibitors, Sulfa antibiotics, Topiramate, Codeine, and Iodinated contrast media   ? ?Review of Systems   ?Review of Systems  ?Constitutional:  Positive for fatigue. Negative for chills, diaphoresis and fever.  ?Eyes:  Negative for visual disturbance.  ?Respiratory:  Negative for cough, chest tightness, shortness of breath and wheezing.   ?Cardiovascular:  Negative for chest pain, palpitations and leg swelling.  ?Gastrointestinal:  Positive for diarrhea. Negative for abdominal pain, constipation, nausea and vomiting.  ?Genitourinary:  Negative for dysuria, flank pain and frequency.  ?Musculoskeletal:  Negative for back pain, neck pain and  neck stiffness.  ?Skin:  Negative for rash and wound.  ?Neurological:  Positive for seizures (hx of but none reported), speech difficulty, light-headedness and headaches (mild). Negative for dizziness, syncope, facial asymmetry, weakness and numbness.  ?Psychiatric/Behavioral:  Positive for confusion. Negative for agitation.   ?All other systems reviewed and are negative. ? ?Physical Exam ?Updated Vital Signs ?BP (!) 182/67 (BP Location: Right Arm)   Pulse 99   Temp 100.3 ?F (37.9 ?C) (Oral)   Resp (!) 22   SpO2 95%  ?Physical Exam ?Vitals and nursing note reviewed.  ?Constitutional:   ?   General: She is not in acute distress. ?   Appearance: She is well-developed. She is not ill-appearing or diaphoretic.  ?HENT:  ?   Head: Normocephalic and atraumatic.  ?   Nose: No congestion or rhinorrhea.  ?   Mouth/Throat:  ?   Mouth: Mucous membranes are moist.  ?   Pharynx: No oropharyngeal exudate or  posterior oropharyngeal erythema.  ?Eyes:  ?   Extraocular Movements: Extraocular movements intact.  ?   Conjunctiva/sclera: Conjunctivae normal.  ?   Pupils: Pupils are equal, round, and reactive to light.  ?Cardiovascular:  ?   Rate and Rhythm: Normal rate and regular rhythm.  ?   Heart sounds: No murmur heard. ?Pulmonary:  ?   Effort: Pulmonary effort is normal. No respiratory distress.  ?   Breath sounds: Normal breath sounds. No wheezing, rhonchi or rales.  ?Chest:  ?   Chest wall: No tenderness.  ?Abdominal:  ?   General: Abdomen is flat.  ?   Palpations: Abdomen is soft.  ?   Tenderness: There is no abdominal tenderness. There is no right CVA tenderness, left CVA tenderness, guarding or rebound.  ?Musculoskeletal:     ?   General: No swelling or tenderness.  ?   Cervical back: Neck supple. No tenderness.  ?   Right lower leg: Edema present.  ?   Left lower leg: Edema present.  ?Skin: ?   General: Skin is warm and dry.  ?   Capillary Refill: Capillary refill takes less than 2 seconds.  ?   Findings: No erythema or  rash.  ?Neurological:  ?   General: No focal deficit present.  ?   Mental Status: She is alert.  ?   GCS: GCS eye subscore is 4. GCS verbal subscore is 5. GCS motor subscore is 6.  ?   Cranial Nerves: No facial asymmetr

## 2021-12-25 NOTE — ED Notes (Signed)
Pt family updated

## 2021-12-26 ENCOUNTER — Inpatient Hospital Stay (HOSPITAL_COMMUNITY): Payer: Medicare HMO

## 2021-12-26 ENCOUNTER — Telehealth: Payer: Self-pay

## 2021-12-26 DIAGNOSIS — I6523 Occlusion and stenosis of bilateral carotid arteries: Secondary | ICD-10-CM

## 2021-12-26 DIAGNOSIS — N390 Urinary tract infection, site not specified: Secondary | ICD-10-CM

## 2021-12-26 DIAGNOSIS — I63231 Cerebral infarction due to unspecified occlusion or stenosis of right carotid arteries: Secondary | ICD-10-CM | POA: Diagnosis not present

## 2021-12-26 DIAGNOSIS — I63512 Cerebral infarction due to unspecified occlusion or stenosis of left middle cerebral artery: Secondary | ICD-10-CM

## 2021-12-26 DIAGNOSIS — I5023 Acute on chronic systolic (congestive) heart failure: Secondary | ICD-10-CM

## 2021-12-26 DIAGNOSIS — J449 Chronic obstructive pulmonary disease, unspecified: Secondary | ICD-10-CM

## 2021-12-26 DIAGNOSIS — N170 Acute kidney failure with tubular necrosis: Secondary | ICD-10-CM | POA: Diagnosis not present

## 2021-12-26 DIAGNOSIS — N184 Chronic kidney disease, stage 4 (severe): Secondary | ICD-10-CM

## 2021-12-26 DIAGNOSIS — I6389 Other cerebral infarction: Secondary | ICD-10-CM

## 2021-12-26 DIAGNOSIS — J690 Pneumonitis due to inhalation of food and vomit: Secondary | ICD-10-CM

## 2021-12-26 DIAGNOSIS — R4182 Altered mental status, unspecified: Secondary | ICD-10-CM | POA: Diagnosis not present

## 2021-12-26 DIAGNOSIS — I639 Cerebral infarction, unspecified: Secondary | ICD-10-CM

## 2021-12-26 DIAGNOSIS — N179 Acute kidney failure, unspecified: Secondary | ICD-10-CM

## 2021-12-26 DIAGNOSIS — A419 Sepsis, unspecified organism: Secondary | ICD-10-CM

## 2021-12-26 LAB — LIPID PANEL
Cholesterol: 100 mg/dL (ref 0–200)
HDL: 59 mg/dL (ref >40)
LDL Cholesterol: 25 mg/dL (ref 0–99)
Total CHOL/HDL Ratio: 1.7 RATIO
Triglycerides: 78 mg/dL (ref <150)
VLDL: 16 mg/dL (ref 0–40)

## 2021-12-26 LAB — ECHOCARDIOGRAM COMPLETE
AR max vel: 1.45 cm2
AV Area VTI: 2.12 cm2
AV Area mean vel: 1.37 cm2
AV Mean grad: 5 mmHg
AV Peak grad: 8.2 mmHg
Ao pk vel: 1.43 m/s
Calc EF: 43.4 %
Height: 63 in
S' Lateral: 2.2 cm
Single Plane A2C EF: 34.6 %
Single Plane A4C EF: 55.7 %
Weight: 2634.94 oz

## 2021-12-26 LAB — GLUCOSE, CAPILLARY
Glucose-Capillary: 87 mg/dL (ref 70–99)
Glucose-Capillary: 100 mg/dL — ABNORMAL HIGH (ref 70–99)
Glucose-Capillary: 159 mg/dL — ABNORMAL HIGH (ref 70–99)

## 2021-12-26 MED ORDER — PANTOPRAZOLE SODIUM 40 MG PO TBEC
40.0000 mg | DELAYED_RELEASE_TABLET | Freq: Two times a day (BID) | ORAL | Status: DC
  Administered 2021-12-26 – 2021-12-28 (×5): 40 mg via ORAL
  Filled 2021-12-26 (×5): qty 1

## 2021-12-26 MED ORDER — MONTELUKAST SODIUM 10 MG PO TABS
10.0000 mg | ORAL_TABLET | Freq: Every day | ORAL | Status: DC
Start: 2021-12-26 — End: 2021-12-28
  Administered 2021-12-26 – 2021-12-28 (×3): 10 mg via ORAL
  Filled 2021-12-26 (×3): qty 1

## 2021-12-26 MED ORDER — PERFLUTREN LIPID MICROSPHERE
1.0000 mL | INTRAVENOUS | Status: AC | PRN
  Administered 2021-12-26: 2 mL via INTRAVENOUS
  Filled 2021-12-26: qty 10

## 2021-12-26 MED ORDER — LACTATED RINGERS IV BOLUS
500.0000 mL | Freq: Once | INTRAVENOUS | Status: AC
  Administered 2021-12-26: 500 mL via INTRAVENOUS

## 2021-12-26 MED ORDER — MAGNESIUM SULFATE 2 GM/50ML IV SOLN
2.0000 g | Freq: Once | INTRAVENOUS | Status: AC
  Administered 2021-12-26: 2 g via INTRAVENOUS
  Filled 2021-12-26: qty 50

## 2021-12-26 MED ORDER — FERROUS SULFATE 325 (65 FE) MG PO TABS
325.0000 mg | ORAL_TABLET | Freq: Two times a day (BID) | ORAL | Status: DC
  Administered 2021-12-26 – 2021-12-28 (×4): 325 mg via ORAL
  Filled 2021-12-26 (×4): qty 1

## 2021-12-26 MED ORDER — VENLAFAXINE HCL ER 75 MG PO CP24
75.0000 mg | ORAL_CAPSULE | Freq: Every day | ORAL | Status: DC
  Administered 2021-12-26 – 2021-12-28 (×3): 75 mg via ORAL
  Filled 2021-12-26 (×3): qty 1

## 2021-12-26 MED ORDER — ARIPIPRAZOLE 10 MG PO TABS
5.0000 mg | ORAL_TABLET | Freq: Every day | ORAL | Status: DC
  Administered 2021-12-26 – 2021-12-28 (×3): 5 mg via ORAL
  Filled 2021-12-26 (×3): qty 1

## 2021-12-26 MED ORDER — SODIUM CHLORIDE 0.9 % IV SOLN
1.0000 g | Freq: Every day | INTRAVENOUS | Status: DC
  Administered 2021-12-26: 1 g via INTRAVENOUS
  Filled 2021-12-26: qty 10

## 2021-12-26 MED ORDER — VITAMIN D (ERGOCALCIFEROL) 1.25 MG (50000 UNIT) PO CAPS
50000.0000 [IU] | ORAL_CAPSULE | ORAL | Status: DC
  Administered 2021-12-26: 50000 [IU] via ORAL
  Filled 2021-12-26 (×2): qty 1

## 2021-12-26 MED ORDER — SODIUM CHLORIDE 0.9 % IV SOLN
500.0000 mg | INTRAVENOUS | Status: DC
  Administered 2021-12-26: 500 mg via INTRAVENOUS
  Filled 2021-12-26: qty 5

## 2021-12-26 MED ORDER — ALBUTEROL SULFATE (2.5 MG/3ML) 0.083% IN NEBU: 3.0000 mL | INHALATION_SOLUTION | RESPIRATORY_TRACT | Status: DC | PRN

## 2021-12-26 MED ORDER — ATORVASTATIN CALCIUM 10 MG PO TABS
10.0000 mg | ORAL_TABLET | Freq: Every day | ORAL | Status: DC
Start: 2021-12-26 — End: 2021-12-28
  Administered 2021-12-26 – 2021-12-28 (×3): 10 mg via ORAL
  Filled 2021-12-26 (×3): qty 1

## 2021-12-26 MED ORDER — METOPROLOL TARTRATE 50 MG PO TABS
100.0000 mg | ORAL_TABLET | Freq: Two times a day (BID) | ORAL | Status: DC
  Administered 2021-12-26 – 2021-12-28 (×5): 100 mg via ORAL
  Filled 2021-12-26 (×5): qty 2

## 2021-12-26 MED ORDER — VITAMIN B-12 100 MCG PO TABS
500.0000 ug | ORAL_TABLET | Freq: Every day | ORAL | Status: DC
  Administered 2021-12-26 – 2021-12-28 (×3): 500 ug via ORAL
  Filled 2021-12-26 (×3): qty 5

## 2021-12-26 MED ORDER — MAGNESIUM OXIDE -MG SUPPLEMENT 400 (240 MG) MG PO TABS
400.0000 mg | ORAL_TABLET | Freq: Every day | ORAL | Status: DC
  Administered 2021-12-26 – 2021-12-28 (×3): 400 mg via ORAL
  Filled 2021-12-26 (×3): qty 1

## 2021-12-26 MED ORDER — MIRTAZAPINE 15 MG PO TABS
15.0000 mg | ORAL_TABLET | Freq: Every day | ORAL | Status: DC
  Administered 2021-12-26 – 2021-12-27 (×2): 15 mg via ORAL
  Filled 2021-12-26 (×2): qty 1

## 2021-12-26 MED ORDER — NITROGLYCERIN 0.4 MG SL SUBL: 0.4000 mg | SUBLINGUAL_TABLET | SUBLINGUAL | Status: DC | PRN

## 2021-12-26 MED ORDER — AMLODIPINE BESYLATE 10 MG PO TABS
10.0000 mg | ORAL_TABLET | Freq: Every day | ORAL | Status: DC
  Administered 2021-12-26 – 2021-12-28 (×3): 10 mg via ORAL
  Filled 2021-12-26 (×3): qty 1

## 2021-12-26 MED ORDER — ASPIRIN 81 MG PO CHEW
81.0000 mg | CHEWABLE_TABLET | Freq: Every day | ORAL | Status: DC
  Administered 2021-12-26 – 2021-12-28 (×3): 81 mg via ORAL
  Filled 2021-12-26 (×3): qty 1

## 2021-12-26 MED ORDER — CLOPIDOGREL BISULFATE 75 MG PO TABS
75.0000 mg | ORAL_TABLET | Freq: Every day | ORAL | Status: DC
  Administered 2021-12-26 – 2021-12-28 (×3): 75 mg via ORAL
  Filled 2021-12-26 (×3): qty 1

## 2021-12-26 MED ORDER — LOSARTAN POTASSIUM 50 MG PO TABS
50.0000 mg | ORAL_TABLET | Freq: Every day | ORAL | Status: DC
  Administered 2021-12-26 – 2021-12-27 (×2): 50 mg via ORAL
  Filled 2021-12-26 (×2): qty 1

## 2021-12-26 MED ORDER — ONDANSETRON HCL 4 MG/2ML IJ SOLN
4.0000 mg | Freq: Four times a day (QID) | INTRAMUSCULAR | Status: DC | PRN
  Administered 2021-12-26 – 2021-12-28 (×3): 4 mg via INTRAVENOUS
  Filled 2021-12-26 (×3): qty 2

## 2021-12-26 NOTE — H&P (Addendum)
History and Physical    Patient: [name] ZOX:[id number] DOB: [birth year] DOA: 12/25/2021 DOS: the patient was seen and examined on 12/26/2021 PCP: Garlan Fillers, MD  Patient coming from: Home  Chief Complaint:  Chief Complaint  Patient presents with   Altered Mental Status   Possible Overdose   HPI: [name] is a 76 y.o. female with medical history significant of seizures, CVA, type 2 diabetes, asthma, COPD, PAD, CKD stage IV, hypertension and history of DVT who presents with concerns of altered mental status.  Patient unable to provide most history and is alert and oriented only to herself.  Reportedly she was last seen normal by family 3 days ago and presents today with altered mental status.  She only reports not feeling well and more tired for the past several days.  Unsure otherwise of whether she has been having cough, shortness of breath or any dysuria.  In the ED, she was febrile with a fever of 100.3 F, BP 180/110 on room air.  Leukocytosis of 18.6 K, no anemia.  BMP significant for elevated creatinine of 2.04 from prior of 1.64. Ammonia level was normal at 11.  BNP mildly elevated at 764.  Troponin of 24-26. UA with small leukocyte, negative nitrite. Chest x-ray revealed right lung infiltrate likely aspiration.  CT head was negative but MRI brain was revealing for punctate acute infarct of the superior right caudate/corona radiata.  Chronic left ICA occlusion.  Upon further investigation and discussion with daughter by neurology, she reportedly ran out of her Keppra as well as several other medications for the past 2 weeks.  Review of Systems: unable to review all systems due to the inability of the patient to answer questions. Past Medical History:  Diagnosis Date   Allergy    Anemia    past hx per pt    Anxiety    Arthritis    back    Arthropathy, unspecified, site unspecified    Blind loop syndrome    Carotid artery disease (HCC)    documented  occlusion of left ICA   Cataract    removed both eyes    Clotting disorder (HCC)    Hx Clot in leg per pt    Colostomy present [facility])    Coronary artery disease    a. known 60-70% mid-LAD stenosis with caths in 1999, 2002, 2006, and 2012 showing stable anatomy b. 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HCC)    Diabetes mellitus    Diverticulosis    Dizziness    chronic   Dyslipidemia    Dyspnea    with exertion   Fatty liver    GERD (gastroesophageal reflux disease)    hiatial hernia   Heart murmur    Hepatomegaly    Hiatal hernia    HTN (hypertension)    Hyperlipidemia    Hypertension    Irritable bowel syndrome    Melanosis coli    Metatarsal fracture right   Normal nuclear stress test    nml 01/03/10;06/19/07   Other, mixed, or unspecified nondependent drug abuse, unspecified    Pancreatitis, chronic (HCC)    Peripheral vascular disease (HCC) 02/20/10   s/p stent of left SFA   Seizure (HCC) 11/04/2018   last 1 yr 2020 per pt unsure month    Sleep apnea    no cpap    Stroke [facility])    Thyroid nodule    Unspecified essential hypertension  Vitamin B12 deficiency    Past Surgical History:  Procedure Laterality Date   ABDOMINAL HYSTERECTOMY     partial   AUGMENTATION MAMMAPLASTY     bilateral 1976 retro    BREAST ENHANCEMENT SURGERY     CARDIAC CATHETERIZATION  07/07/98;08/31/01;03/04/05   60 -70% LAD   CATARACT EXTRACTION Bilateral    COLON RESECTION N/A 10/14/2017   Procedure: EXPLORATORY LAPAROTOMY, EXTENDED RIGHT COLECTOMY; APPLICATION OF ABDOMINAL VACUUM DRESSING;  Surgeon: Griselda Miner, MD;  Location: [facility] ORS;  Service: General;  Laterality: N/A;   COLONOSCOPY     ENDOBRONCHIAL ULTRASOUND Bilateral 02/24/2017   Procedure: ENDOBRONCHIAL ULTRASOUND;  Surgeon: Leslye Peer, MD;  Location: [facility] ENDOSCOPY;  Service: Cardiopulmonary;  Laterality: Bilateral;   FEMORAL ARTERY STENT     Left leg   LAPAROTOMY N/A 10/16/2017   Procedure:  EXPLORATORY LAPAROTOMY, PARTIAL OMENTECTOMY, RESECTION ISCHEMIC ILEUM 130CM, APPLICATION OF VAC ABDOMINAL DRESSING;  Surgeon: Darnell Level, MD;  Location: [facility] ORS;  Service: General;  Laterality: N/A;   LAPAROTOMY N/A 10/18/2017   Procedure: RE-EXPLORATION OF ABDOMEN, ILEOSTOMY CREATION;  Surgeon: Almond Lint, MD;  Location: [facility] OR[facility];  Service: General;  Laterality: N/A;   LUNG SURGERY  03/2017   Benign polyps removed   PATELLA REALIGNMENT Left    POLYPECTOMY     SIGMOIDOSCOPY     TONSILLECTOMY     Social History:  reports that she quit smoking about 11 years ago. Her smoking use included cigarettes. She has a 50.00 pack-year smoking history. She has never used smokeless tobacco. She reports that she does not drink alcohol and does not use drugs.  Allergies  Allergen Reactions   Adhesive [Tape] Other (See Comments)    "Took off my skin"   Other Rash    "Took off my skin"   Ace Inhibitors Other (See Comments)    Other reaction(s): Unknown   Sulfa Antibiotics     Other reaction(s): Unknown   Topiramate Other (See Comments)   Codeine Other (See Comments)    Altered mental state    Iodinated Contrast Media Rash    Family History  Problem Relation Age of Onset   Hypertension Mother    Osteoarthritis Mother    Pulmonary embolism Mother    Hypertension Father        aneurysm   Stroke Father    Colon cancer Paternal Grandfather    Stroke Other    Breast cancer Neg Hx    Colon polyps Neg Hx    Esophageal cancer Neg Hx    Rectal cancer Neg Hx    Stomach cancer Neg Hx    Pancreatic cancer Neg Hx    Liver disease Neg Hx     Prior to Admission medications   Medication Sig Start Date End Date Taking? Authorizing Provider  acetaminophen (TYLENOL) 325 MG tablet Take 2 tablets by mouth as needed.    [provider]  albuterol (VENTOLIN HFA) 108 (90 Base) MCG/ACT inhaler Inhale 1-2 puffs into the lungs as needed. 03/30/21   [provider]  amLODipine (NORVASC) 10 MG  tablet Take 10 mg by mouth daily.    [provider]  ARIPiprazole (ABILIFY) 5 MG tablet Take 5 mg by mouth daily. 03/30/21   [provider]  aspirin 81 MG EC tablet Take 1 tablet by mouth as needed. 04/17/21   [provider]  atorvastatin (LIPITOR) 20 MG tablet Take 20 mg by mouth daily.    [provider]  benzonatate (TESSALON) 100  MG capsule Take 100 mg by mouth as needed. 03/19/21   [provider]  cholestyramine (QUESTRAN) 4 g packet Take 1 packet (4 g total) by mouth 2 (two) times daily. 06/08/21   Meryl Dare, MD  clopidogrel (PLAVIX) 75 MG tablet Take 1 tablet by mouth daily.    [provider]  [employer] DIGESTIVE PROBIOTIC 250 MG capsule Take 500 mg by mouth daily. 06/27/21   [provider]  cyclobenzaprine (FLEXERIL) 10 MG tablet Take 0.5 tablets (5 mg total) by mouth 3 (three) times daily as needed for muscle spasms. 11/23/20   Elgergawy, Leana Roe, MD  diphenoxylate-atropine (LOMOTIL) 2.5-0.025 MG tablet Take 1 tablet by mouth 3 (three) times daily. 08/27/19   Zannie Cove, MD  ferrous sulfate 220 (44 Fe) MG/5ML solution Take 5 mLs (220 mg total) by mouth 2 (two) times daily with a meal. 11/23/20   Elgergawy, Leana Roe, MD  fluticasone (FLONASE) 50 MCG/ACT nasal spray Place 2 sprays into both nostrils as needed. 01/29/16   [provider]  furosemide (LASIX) 20 MG tablet Take 20 mg by mouth daily. 08/06/21   [provider]  HYDROcodone-acetaminophen (NORCO) 10-325 MG tablet Take 1 tablet by mouth 4 (four) times daily as needed. 07/24/21   [provider]  levETIRAcetam (KEPPRA) 500 MG tablet TAKE 1 TABLET BY MOUTH TWICE A DAY 01/20/20   Dohmeier, Porfirio Mylar, MD  loperamide (IMODIUM) 2 MG capsule Take 2 mg by mouth daily as needed for diarrhea or loose stools.     [provider]  losartan (COZAAR) 100 MG tablet TAKE 1/2 A TABLET BY MOUTH ONCE DAILY 10/05/21   [place], Peter M, MD  magnesium oxide  (MAG-OX) 400 MG tablet Take 1 tablet by mouth daily. 05/08/20   [provider]  metoprolol tartrate (LOPRESSOR) 100 MG tablet Take 1 tablet (100 mg total) by mouth 2 (two) times daily. 11/23/20   Elgergawy, Leana Roe, MD  mirtazapine (REMERON) 15 MG tablet Take 15 mg by mouth at bedtime. 07/18/21   [provider]  montelukast (SINGULAIR) 10 MG tablet Take 10 mg by mouth daily. 08/09/18   [provider]  nitroGLYCERIN (NITROSTAT) 0.4 MG SL tablet Place 1 tablet (0.4 mg total) under the tongue every 5 (five) minutes as needed for chest pain. 11/08/15   [place], Peter M, MD  ondansetron (ZOFRAN) 4 MG tablet Take 1 tablet (4 mg total) by mouth every 6 (six) hours as needed for nausea or vomiting. 10/24/21   Esterwood, Amy S, PA-C  ondansetron (ZOFRAN-ODT) 4 MG disintegrating tablet Dissolve 1 tablet on tongue twice a day for 7 days then decrease to 1 tablet every 6-8 hours as needed. 11/28/20   Arnaldo Natal, NP  Pancrelipase, Lip-Prot-Amyl, (ZENPEP) 40000-126000 units CPEP Take 2 capsules with meals and one capsule with snacks. 06/08/21   Meryl Dare, MD  pantoprazole (PROTONIX) 40 MG tablet Take 1 tablet (40 mg total) by mouth 2 (two) times daily. 10/24/21   Esterwood, Amy S, PA-C  SODIUM FLUORIDE 5000 PPM 1.1 % PSTE SMARTSIG:Sparingly By Mouth Twice Daily 05/16/20   [provider]  traMADol (ULTRAM) 50 MG tablet Take 50 mg by mouth 3 (three) times daily as needed for moderate pain. 06/01/20   [provider]  venlafaxine XR (EFFEXOR-XR) 75 MG 24 hr capsule Take 1 capsule (75 mg total) by mouth daily. 05/18/20   [provider]  vitamin B-12 (CYANOCOBALAMIN) 500 MCG tablet Take 500 mcg by mouth daily.  [provider]  Vitamin D, Ergocalciferol, (DRISDOL) 1.25 MG (50000 UT) CAPS capsule Take 50,000 Units by mouth every Wednesday.     [provider]    Physical Exam: Vitals:   12/25/21 2345 12/26/21 0030 12/26/21 0057  12/26/21 0104  BP: (!) 177/76 (!) 161/82 (!) 178/75   Pulse: 95 95 100   Resp: 18 20 17    Temp:   98.6 F (37 C)   TempSrc:   Oral   SpO2: 95% 92% 96%   Weight:    74.7 kg  Height:    5\' 3"  (1.6 m)   Constitutional: NAD, calm, comfortable, nontoxic appearing elderly female laying at approximately 30 degree incline in bed Eyes: PERRL, lids and conjunctivae normal ENMT: Mucous membranes are moist.  Neck: normal, supple Respiratory: clear to auscultation bilaterally, no wheezing, no crackles. Normal respiratory effort. No accessory muscle use.  Cardiovascular: Regular rate and rhythm, no murmurs / rubs / gallops.  +3 pitting edema bilateral lower extremity. Abdomen: Soft, non distended, no tenderness,  Bowel sounds positive.  Musculoskeletal: no clubbing / cyanosis. No joint deformity upper and lower extremities. Normal muscle tone.  Skin: no rashes, lesions, ulcers. Neurologic: CN 2-12 grossly intact.  Strength 5/5 in all 4.  Equal shoulder shrug, equal smile.  Intact heel-to-shin.  Alert and oriented only to self and otherwise pleasantly confused. Psychiatric: Unable to assess due to confusion. Data Reviewed:  See HPI  Assessment and Plan: * Stroke [facility]) - MRI brain revealing for punctate acute infarct of the superior caudate/corona radiata -Continue work-up with MRA head and neck -Obtain carotid ultrasound -Obtain echocardiogram  - continue daily aspirin. Appears she may also have been on Plavix but needs to have med rec verified. -Obtain A1c and lipids -PT/OT/SLT -Frequent neuro checks and keep on telemetry -Allow for permissive hypertension with blood pressure treatment as needed only if systolic goes above 220  Seizure [facility]) - Patient with history of seizures.  Reportedly per daughter has ran out of her Keppra for 2 weeks.  Neurology suspects AMS could be secondary to to seizure activity.  Has given IV Keppra load in the ED and will resume her oral Keppra 500 BID  Sepsis  (HCC) -Presented with fever and leukocytosis  -secondary to aspiration pneumonia and suspected UTI. -unable to receive full 30cc/kg given she appears clinically overloaded  UTI (urinary tract infection) - Patient unable to verify any symptoms but UA shows small leukocyte, negative nitrite.  Urine culture pending she is already covered with IV Rocephin for pneumonia.  Aspiration pneumonia (HCC) - Chest x-ray shows right lung infiltrate which would be in line for suspected seizure activity prior to admission.  Continue IV Rocephin and azithromycin.  Acute on chronic systolic CHF (congestive heart failure) (HCC) -LE edema noted on exam although no findings of pulmonary edema on CXR. Possible could have missed her Lasix in the past week. Although will hold on diuretics for now with sepsis and getting judicious fluid bolus.   Acute renal failure superimposed on stage 4 chronic kidney disease (HCC) - Creatinine of 2.04 from prior of 1.64. Follow repeat after fluid overnight.  -avoid nephrotoxic agent       Advance Care Planning:   Code Status: Full Code   Consults: neurology  Family Communication: No family at bedside  Severity of Illness: The appropriate patient status for this patient is INPATIENT. Inpatient status is judged to be reasonable and necessary in order to provide the required intensity of service to ensure  the patient's safety. The patient's presenting symptoms, physical exam findings, and initial radiographic and laboratory data in the context of their chronic comorbidities is felt to place them at high risk for further clinical deterioration. Furthermore, it is not anticipated that the patient will be medically stable for discharge from the hospital within 2 midnights of admission.   * I certify that at the point of admission it is my clinical judgment that the patient will require inpatient hospital care spanning beyond 2 midnights from the point of admission due to high  intensity of service, high risk for further deterioration and high frequency of surveillance required.*  Author: Anselm Jungling, DO 12/26/2021 1:26 AM  For on call review [email or web address].

## 2021-12-26 NOTE — Progress Notes (Signed)
?PROGRESS NOTE ? ? ? ?[name]  UTM:[id number] DOB: December 23, 1945 DOA: 12/25/2021 ?PCP: Donnajean Lopes, MD  ? ? ?Brief Narrative:  ?76 year old chronically sick lady with multiple issues including seizure disorder, history of stroke, type 2 diabetes, asthma, COPD, peripheral artery disease, CKD stage IV with baseline creatinine about 1.6-1.7, hypertension and history of DVT presented with altered mental status.  Patient lives alone.  She tells me that she was getting ready to get out of the house as she had carpet cleaners coming to the house then she does not know what happened to her.  She woke up in the emergency room.  Patient was complaining of not feeling well for the last few days.  According to the family members, she was found unresponsive at home. ?At the emergency room, low-grade temperature, leukocytosis 18 K, acute kidney injury.  Chest x-ray with right lung infiltrate.  CT head negative but MRI was consistent with punctate acute infarct of the superior right caudate and corona radiator.  Chronic left ICA occlusion.  Admitted to hospital with aspiration pneumonia, acute stroke, suspected seizure with neurology consultations. ? ? ?Assessment & Plan: ?  ?Acute right MCA ischemic stroke: ?Clinical findings, confusion.  No focal deficits. ?CT head findings on presentation with no acute intracranial findings. ?MRI of the brain on presentation with punctate acute infarcts of the right caudate head and coronary radiator.  Chronic left ICA occlusion. ?MRA of the brain, chronic occlusion of left ICA with left MCA reconstitution, right ICA patent without stenosis. ?Carotid ultrasound, ordered.  Pending. ?2D echocardiogram, pending. ?Antiplatelet therapy, on aspirin and Plavix at home.  Continued. ?LDL 25.  At goal.  On home dose of statin. ?Hemoglobin A1c, pending. ?Therapy recommendations, a skilled nursing facility. ?Followed by PT/OT/speech.  Neurology following. ? ?Left ICA occlusion: Chronic.  Followed  by vascular surgery as outpatient.  Deemed not a surgical candidate as per patient. ? ?Aspiration pneumonia right lower lobe: Likely aspirated during loss of consciousness.  Currently on room air.  Clinically improving.  Speech therapy to evaluate for swallowing. ?Started on Rocephin and azithromycin, will continue 5 days of antibiotic therapy. ?Blood cultures are drawn.  Results pending.  No sputum production. ? ?Seizure disorder/suspected breakthrough seizure: Likely ran out of medication but patient denies.  Loaded with Keppra and resume her home dose of Keppra.  All-time seizure precautions.  Fall precautions. ? ?Acute kidney injury on chronic kidney disease stage IV: Due to above.  She does have a history of low ejection fraction.  Repeat echocardiogram pending.  Currently euvolemic.  Received 500 mL of IV fluids.  Recheck renal functions tomorrow morning.  We will avoid further IV fluids. ? ?Severe hypomagnesemia: Chronic hypomagnesemia on mag oxide at home.  Presentation magnesium 1.1.  Repleted with 2 g magnesium.  Will give further 2 g of IV magnesium and recheck level tomorrow morning.  Check phosphorus. ? ?Essential hypertension: Elevated.  No indication for permissive hypertension.  Resume home medications, hold Lasix due to AKI. ? ?Type 2 diabetes: Diet controlled.  Not on treatment.  We will follow A1c. ? ?Chronic pain syndrome/chronic opiate use: On opiates and Flexeril at home.  Avoid. ? ? ? ? ?DVT prophylaxis: enoxaparin (LOVENOX) injection 30 mg Start: 12/26/21 1000 ? ? ?Code Status: Full code ?Family Communication: None ?Disposition Plan: Status is: Inpatient ?Remains inpatient appropriate because: Inpatient stroke work-up, electrolyte abnormalities.  Rehab. ?  ? ? ?Consultants:  ?Neurology ? ?Procedures:  ?None ? ?Antimicrobials:  ?Rocephin and azithromycin  3/14--- ? ? ?Subjective: ?Patient seen and examined in the morning rounds.  She was eating breakfast.  She tells me that she does not  remember what happened to her but she woke up at emergency room.  Denies running out of seizure medications.  Currently feels okay.  Denies any focal deficits.  Blood pressure is elevated. ? ?Objective: ?Vitals:  ? 12/26/21 0057 12/26/21 0104 12/26/21 0318 12/26/21 0816  ?BP: (!) 178/75  (!) 157/57 (!) 182/83  ?Pulse: 100  85 89  ?Resp: '17  18 16  '$ ?Temp: 98.6 ?F (37 ?C)  97.6 ?F (36.4 ?C) 98.5 ?F (36.9 ?C)  ?TempSrc: Oral   Oral  ?SpO2: 96%  93% 97%  ?Weight:  74.7 kg    ?Height:  '5\' 3"'$  (1.6 m)    ? ? ?Intake/Output Summary (Last 24 hours) at 12/26/2021 1059 ?Last data filed at 12/26/2021 0300 ?Gross per 24 hour  ?Intake 851.54 ml  ?Output --  ?Net 851.54 ml  ? ?Filed Weights  ? 12/26/21 0104  ?Weight: 74.7 kg  ? ? ?Examination: ? ?General exam: Appears calm and comfortable, laying comfortably. ?Respiratory system: Clear to auscultation. Respiratory effort normal. ?Cardiovascular system: S1 & S2 heard, RRR. No JVD, murmurs, rubs, gallops or clicks. No pedal edema. ?Gastrointestinal system: Abdomen is nondistended, soft and nontender. No organomegaly or masses felt. Normal bowel sounds heard. ?Central nervous system: Alert and oriented.  She is confused about the date and time. ?Strength is equal in all extremities. ? ? ? ?Data Reviewed: I have personally reviewed following labs and imaging studies ? ?CBC: ?Recent Labs  ?Lab 12/25/21 ?1518  ?WBC 18.6*  ?NEUTROABS 16.1*  ?HGB 12.6  ?HCT 38.7  ?MCV 94.9  ?PLT 227  ? ?Basic Metabolic Panel: ?Recent Labs  ?Lab 12/25/21 ?1518  ?NA 136  ?K 5.0  ?CL 106  ?CO2 20*  ?GLUCOSE 110*  ?BUN 19  ?CREATININE 2.04*  ?CALCIUM 8.9  ?MG 1.1*  ? ?GFR: ?Estimated Creatinine Clearance: 23.4 mL/min (A) (by C-G formula based on SCr of 2.04 mg/dL (H)). ?Liver Function Tests: ?Recent Labs  ?Lab 12/25/21 ?1518  ?AST 28  ?ALT 16  ?ALKPHOS 87  ?BILITOT 1.0  ?PROT 6.6  ?ALBUMIN 3.5  ? ?Recent Labs  ?Lab 12/25/21 ?1518  ?LIPASE 23  ? ?Recent Labs  ?Lab 12/25/21 ?1605  ?AMMONIA 11  ? ?Coagulation  Profile: ?Recent Labs  ?Lab 12/25/21 ?1518  ?INR 1.2  ? ?Cardiac Enzymes: ?No results for input(s): CKTOTAL, CKMB, CKMBINDEX, TROPONINI in the last 168 hours. ?BNP (last 3 results) ?No results for input(s): PROBNP in the last 8760 hours. ?HbA1C: ?No results for input(s): HGBA1C in the last 72 hours. ?CBG: ?Recent Labs  ?Lab 12/26/21 ?0620  ?GLUCAP 87  ? ?Lipid Profile: ?Recent Labs  ?  12/26/21 ?0306  ?CHOL 100  ?HDL 59  ?[place] 25  ?TRIG 78  ?CHOLHDL 1.7  ? ?Thyroid Function Tests: ?Recent Labs  ?  12/25/21 ?1725  ?TSH 1.434  ? ?Anemia Panel: ?No results for input(s): VITAMINB12, FOLATE, FERRITIN, TIBC, IRON, RETICCTPCT in the last 72 hours. ?Sepsis Labs: ?Recent Labs  ?Lab 12/25/21 ?1605 12/25/21 ?1725  ?LATICACIDVEN 1.7 1.6  ? ? ?Recent Results (from the past 240 hour(s))  ?Resp Panel by RT-PCR (Flu A&B, Covid) Nasopharyngeal Swab     Status: None  ? Collection Time: 12/25/21  4:05 PM  ? Specimen: Nasopharyngeal Swab; Nasopharyngeal(NP) swabs in vial transport medium  ?Result Value Ref Range Status  ? SARS Coronavirus 2 by RT  PCR NEGATIVE NEGATIVE Final  ?  Comment: (NOTE) ?SARS-CoV-2 target nucleic acids are NOT DETECTED. ? ?The SARS-CoV-2 RNA is generally detectable in upper respiratory ?specimens during the acute phase of infection. The lowest ?concentration of SARS-CoV-2 viral copies this assay can detect is ?138 copies/mL. A negative result does not preclude SARS-Cov-2 ?infection and should not be used as the sole basis for treatment or ?other patient management decisions. A negative result may occur with  ?improper specimen collection/handling, submission of specimen other ?than nasopharyngeal swab, presence of viral mutation(s) within the ?areas targeted by this assay, and inadequate number of viral ?copies(<138 copies/mL). A negative result must be combined with ?clinical observations, patient history, and epidemiological ?information. The expected result is Negative. ? ?Fact Sheet for Patients:   ?EntrepreneurPulse.com.au ? ?Fact Sheet for Healthcare Providers:  ?IncredibleEmployment.be ? ?This test is no t yet approved or cleared by the [place] FDA and  ?has been Chief Strategy Officer

## 2021-12-26 NOTE — TOC Initial Note (Signed)
Transition of Care [facility]) - Initial/Assessment Note    Patient Details  Name: [name] MRN: [id number] Date of Birth: Sep 06, 1946  Transition of Care [facility] CM/SW Contact:    Kermit Balo, RN Phone Number: 12/26/2021, 10:42 AM  Clinical Narrative:                 Patient lives at home alone. Recommendations are for SNF vs 24 hour care at home. CM called patients son as she is confused. Patient's son states she will not have 24 hour care at home and he asked that she attend [facility] for rehab. He states she has been there before and she did well.  CSW will fax her out to [facility] and will begin insurance auth when appropriate.  TOC following.  Expected Discharge Plan: Skilled Nursing Facility Barriers to Discharge: Continued Medical Work up   Patient Goals and CMS Choice   CMS Medicare.gov Compare Post Acute Care list provided to:: Patient Represenative (must comment) Choice offered to / list presented to : Adult Children  Expected Discharge Plan and Services Expected Discharge Plan: Skilled Nursing Facility In-house Referral: Clinical Social Work Discharge Planning Services: CM Consult Post Acute Care Choice: Skilled Nursing Facility Living arrangements for the past 2 months: Single Family Home                                      Prior Living Arrangements/Services Living arrangements for the past 2 months: Single Family Home Lives with:: Self Patient language and need for interpreter reviewed:: Yes Do you feel safe going back to the place where you live?: Yes      Need for Family Participation in Patient Care: Yes (Comment) Care giver support system in place?: No (comment)   Criminal Activity/Legal Involvement Pertinent to Current Situation/Hospitalization: No - Comment as needed  Activities of Daily Living      Permission Sought/Granted                  Emotional Assessment Appearance:: Appears stated age     Orientation: :  Oriented to Self   Psych Involvement: No (comment)  Admission diagnosis:  Aphasia [R47.01] Stroke (HCC) [I63.9] Urinary tract infection without hematuria, site unspecified [N39.0] Altered mental status, unspecified altered mental status type [R41.82] Cerebrovascular accident (CVA), unspecified mechanism (HCC) [I63.9] Community acquired pneumonia, unspecified laterality [J18.9] Patient Active Problem List   Diagnosis Date Noted   Sepsis (HCC) 12/26/2021   UTI (urinary tract infection) 12/26/2021   Aspiration pneumonia (HCC) 12/26/2021   Acute renal failure superimposed on stage 4 chronic kidney disease (HCC) 12/26/2021   Acute on chronic systolic CHF (congestive heart failure) (HCC) 12/26/2021   Stroke (HCC) 12/25/2021   Pharyngeal dysphagia 07/12/2021   Pressure ulcer of sacral region 07/12/2021   Wound of abdomen 07/12/2021   Closed fracture of distal end of left radius 06/15/2021   Degeneration of lumbar intervertebral disc 04/23/2021   Hyponatremia 11/16/2020   Metabolic acidosis, NAG, failure of bicarbonate regeneration 11/16/2020   Nausea & vomiting 11/16/2020   Acute-on-chronic kidney injury (HCC) 11/16/2020   Abdominal aortic atherosclerosis (HCC) 06/01/2020   Acquired thrombophilia (HCC) 06/01/2020   Insomnia 05/08/2020   Neck pain 01/11/2020   Referred otalgia of both ears 01/11/2020   Frequency of micturition 12/16/2019   Orthostatic hypotension 12/16/2019   Urinary tract infectious disease 12/16/2019   Pneumonia due to coronavirus disease  2019 11/17/2019   Acute combined systolic and diastolic heart failure (HCC) 10/29/2019   COVID-19 10/29/2019   Hyperkalemia 10/16/2019   Bilateral pneumonia 10/16/2019   Acute metabolic encephalopathy 10/15/2019   Noninfective gastroenteritis and colitis, unspecified 09/01/2019   Hypo-osmolality and hyponatremia 08/04/2019   Spasm 06/03/2019   Seizures (HCC) 01/07/2019   Cerebrovascular accident (CVA) due to occlusion of left  carotid artery (HCC) 01/07/2019   CAD, multiple vessel 01/07/2019   H/O ischemic bowel disease 01/07/2019   Protein calorie malnutrition (HCC) 01/04/2019   Acute bronchitis 12/04/2018   Oral phase dysphagia 12/04/2018   Personal history of transient ischemic attack (TIA), and cerebral infarction without residual deficits 11/11/2018   CKD (chronic kidney disease) stage 3, GFR 30-59 ml/min 11/04/2018   Seizure (HCC) 11/03/2018   Anorectal disorder 06/08/2018   Burning tongue syndrome 04/15/2018   Localized edema 03/02/2018   Tachycardia 03/02/2018   Dermatitis 02/12/2018   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  Ileostomy 10/18/2017 10/14/2017   Ischemic colitis (HCC) 10/14/2017   Acute respiratory failure with hypoxemia (HCC) 10/14/2017   Acute post-operative pain 10/14/2017   Acute kidney injury superimposed on CKD (HCC) 10/14/2017   Anemia 10/14/2017   Benign paroxysmal positional vertigo 07/29/2017   Nocturia more than twice per night 05/07/2017   Diabetes due to underlying condition w oth circulatory comp (HCC) 05/07/2017   Atherosclerotic PVD with intermittent claudication (HCC) 05/07/2017   Poor sleep hygiene 05/07/2017   Paradoxical insomnia 05/07/2017   Abnormal feces 04/10/2017   Hilar lymphadenopathy    Hemoptysis 02/19/2017   Pulmonary nodules/lesions, multiple 02/19/2017   Tobacco use 02/19/2017   Disorder of arteries and arterioles, unspecified (HCC) 01/31/2017   Lung field abnormal 01/31/2017   RUQ abdominal pain 01/13/2017   Restless legs syndrome 11/28/2016   Left-sided extracranial carotid artery occlusion 11/26/2016   PAOD (peripheral arterial occlusive disease) (HCC) 11/26/2016   Encounter for routine follow-up after surgery of the circulatory system 11/26/2016    Abnormal weight loss 01/29/2016   Allergic rhinitis 01/29/2016   Chest pain with moderate risk of acute coronary syndrome 10/27/2015   Other slipping, tripping and stumbling without falling, initial encounter 07/28/2015   Acid indigestion 12/31/2013   Pain in left hip 08/26/2013   Screening for gout 05/13/2013   Disorder of skin or subcutaneous tissue 12/22/2012   Difficult or painful urination 09/02/2012   Peripheral blood vessel disorder (HCC) 05/11/2012   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   Follow-up examination, following unspecified surgery 05/11/2012   Occlusion and stenosis of carotid artery without mention of cerebral infarction 04/14/2012   Fatigue 09/24/2011   Gonalgia 07/04/2011   Avitaminosis D 05/02/2011   Chronic pancreatitis (HCC) 04/30/2011   Vitamin B12 deficiency 04/30/2011   Intestinal motility disorder 04/30/2011   Fatty liver 04/30/2011   Adiposity 04/30/2011   Other and unspecified general psychiatric examination 04/26/2011   Abdominal pain 04/09/2011   Iron deficiency anemia, unspecified  04/09/2011   Other general symptoms  04/09/2011   Asthma 04/09/2011   Type 2 diabetes mellitus with diabetic peripheral angiopathy without gangrene (HCC) 01/24/2011   Abnormal weight gain 11/30/2010   Dyslipidemia    Dizziness    Right-sided extracranial carotid artery stenosis    Normal nuclear stress test    Feeling bilious 07/11/2010  Underimmunization status 07/11/2010   Pancreas (digestive gland) works poorly 05/30/2010   B12 deficiency 03/19/2010   DVT 03/16/2010   BLIND LOOP SYNDROME 03/16/2010   Diarrhea 03/16/2010   Chronic depression 02/23/2010   Major depression, single episode 02/23/2010   Cough 11/17/2009   Plantar verruca 11/17/2009   Arteriosclerosis of coronary artery 11/16/2009   Claudication (HCC) 11/16/2009   Compulsive tobacco user syndrome 11/16/2009   HLD (hyperlipidemia) 11/16/2009   Nontoxic single thyroid nodule  11/16/2009   Obstructive apnea 11/16/2009   Radial styloid tenosynovitis 11/16/2009   LBP (low back pain) 11/16/2009   Gastroparesis 11/16/2009   LAXATIVE ABUSE 12/17/2007   BP (high blood pressure) 12/17/2007   Acid reflux 12/17/2007   HIATAL HERNIA 12/17/2007   IBS 12/17/2007   MELANOSIS COLI 12/17/2007   ARTHRITIS 12/17/2007   HEPATOMEGALY 12/17/2007   PCP:  Garlan Fillers, MD Pharmacy:   [id number] [place], [place] - [address] [place] [address] Phone: [phone] Fax: [phone]     Social Determinants of Health (SDOH) Interventions    Readmission Risk Interventions No flowsheet data found.

## 2021-12-26 NOTE — Evaluation (Signed)
Occupational Therapy Evaluation ?Patient Details ?Name: [name] ?MRN: [id number] ?DOB: 1946-01-04 ?Today's Date: 12/26/2021 ? ? ?History of Present Illness [name] is a 76 y.o. female with medical history significant of seizures, CVA, type 2 diabetes, asthma, COPD, PAD, CKD stage IV, hypertension and history of DVT who presents with concerns of altered mental status. MRI revealed acute puncatate infact of superior R caudate/coronoa radiata, chronic L ICA occlusion.  ? ?Clinical Impression ?  ?Pt admitted for concerns listed above. PTA pt reported that she was independent with all ADL's and IADL's, including driving. At this time, pt requiring min guard to min A for functional mobility and BADL's. She requires min verbal cues for most tasks due to cognition. Pt will benefit from [facility] to maximize her independence and safety at home. Acute OT will continue to follow.   ?   ? ?Recommendations for follow up therapy are one component of a multi-disciplinary discharge planning process, led by the attending physician.  Recommendations may be updated based on patient status, additional functional criteria and insurance authorization.  ? ?Follow Up Recommendations ? Home health OT  ?  ?Assistance Recommended at Discharge Set up Supervision/Assistance  ?Patient can return home with the following A little help with walking and/or transfers;A little help with bathing/dressing/bathroom;Assistance with cooking/housework;Direct supervision/assist for medications management;Direct supervision/assist for financial management ? ?  ?Functional Status Assessment ? Patient has had a recent decline in their functional status and demonstrates the ability to make significant improvements in function in a reasonable and predictable amount of time.  ?Equipment Recommendations ? Tub/shower seat;Other (comment) (RW)  ?  ?Recommendations for Other Services   ? ? ?  ?Precautions / Restrictions Precautions ?Precautions:  Fall ?Restrictions ?Weight Bearing Restrictions: No  ? ?  ? ?Mobility Bed Mobility ?Overal bed mobility: Needs Assistance ?Bed Mobility: Supine to Sit, Sit to Supine ?  ?  ?Supine to sit: Min guard ?Sit to supine: Supervision ?  ?General bed mobility comments: No physical assist needed ?  ? ?Transfers ?Overall transfer level: Needs assistance ?Equipment used: None ?Transfers: Sit to/from Stand ?Sit to Stand: Min guard ?  ?  ?  ?  ?  ?General transfer comment: Increased time, min guard for safety ?  ? ?  ?Balance Overall balance assessment: Mild deficits observed, not formally tested ?  ?  ?  ?  ?  ?  ?  ?  ?  ?  ?  ?  ?  ?  ?  ?  ?  ?  ?   ? ?ADL either performed or assessed with clinical judgement  ? ?ADL Overall ADL's : Needs assistance/impaired ?Eating/Feeding: Set up;Sitting ?  ?Grooming: Min guard;Standing ?  ?Upper Body Bathing: Set up;Sitting ?  ?Lower Body Bathing: Minimal assistance;Sitting/lateral leans;Sit to/from stand ?  ?Upper Body Dressing : Set up;Sitting ?  ?Lower Body Dressing: Min guard;Sitting/lateral leans;Sit to/from stand ?  ?Toilet Transfer: Min guard ?  ?Toileting- Clothing Manipulation and Hygiene: Min guard;Sitting/lateral lean;Sit to/from stand ?  ?  ?  ?Functional mobility during ADLs: Min guard ?General ADL Comments: Requiring increased assist for safety, attention, and mild balance deficits.  ? ? ? ?Vision Baseline Vision/History: 1 Wears glasses ?Ability to See in Adequate Light: 0 Adequate ?Patient Visual Report: No change from baseline ?Vision Assessment?: No apparent visual deficits  ?   ?Perception   ?  ?Praxis   ?  ? ?Pertinent Vitals/Pain Pain Assessment ?Pain Assessment: No/denies pain  ? ? ? ?Hand  Dominance Right ?  ?Extremity/Trunk Assessment Upper Extremity Assessment ?Upper Extremity Assessment: Overall WFL for tasks assessed ?  ?Lower Extremity Assessment ?Lower Extremity Assessment: Defer to PT evaluation ?  ?Cervical / Trunk Assessment ?Cervical / Trunk Assessment:  Kyphotic ?  ?Communication Communication ?Communication: No difficulties ?  ?Cognition Arousal/Alertness: Awake/alert ?Behavior During Therapy: [facility] for tasks assessed/performed ?Overall Cognitive Status: Impaired/Different from baseline ?Area of Impairment: Orientation, Attention, Memory, Following commands, Safety/judgement, Awareness, Problem solving ?  ?  ?  ?  ?  ?  ?  ?  ?Orientation Level: Disoriented to, Time, Situation ?Current Attention Level: Focused ?Memory: Decreased short-term memory ?Following Commands: Follows one step commands with increased time, Follows one step commands inconsistently ?Safety/Judgement: Decreased awareness of deficits, Decreased awareness of safety ?Awareness: Emergent ?Problem Solving: Slow processing ?General Comments: Slow processing, requiring increased cuing for safety and attention ?  ?  ?General Comments  VSS on RA ? ?  ?Exercises   ?  ?Shoulder Instructions    ? ? ?Home Living Family/patient expects to be discharged to:: Private residence ?Living Arrangements: Alone ?Available Help at Discharge: Family;Available PRN/intermittently ?Type of Home: House ?Home Access: Stairs to enter ?Entrance Stairs-Number of Steps: 2 ?Entrance Stairs-Rails: Can reach both ?Home Layout: One level ?  ?  ?Bathroom Shower/Tub: Tub/shower unit ?  ?Bathroom Toilet: Handicapped height ?Bathroom Accessibility: Yes ?How Accessible: Accessible via walker ?Home Equipment: None ?  ?  ?  ? ?  ?Prior Functioning/Environment Prior Level of Function : Independent/Modified Independent;Driving ?  ?  ?  ?  ?  ?  ?Mobility Comments: pt reports being indep without AD, driving, grocery shopping ?ADLs Comments: indep ?  ? ?  ?  ?OT Problem List: Decreased strength;Decreased range of motion;Decreased activity tolerance;Impaired balance (sitting and/or standing);Decreased cognition;Decreased safety awareness;Decreased knowledge of use of DME or AE ?  ?   ?OT Treatment/Interventions: Therapeutic  exercise;Self-care/ADL training;Energy conservation;DME and/or AE instruction;Therapeutic activities;Balance training;Patient/family education  ?  ?OT Goals(Current goals can be found in the care plan section) Acute Rehab OT Goals ?Patient Stated Goal: To go home ?OT Goal Formulation: With patient ?Time For Goal Achievement: 12/26/21 ?Potential to Achieve Goals: Good ?ADL Goals ?Additional ADL Goal #1: Pt will complete a medication management task with no errors. ?Additional ADL Goal #2: Pt will complete a multistep functional task independently. ?Additional ADL Goal #3: Pt will recall 3-4 step task provided and complete the task with supervision.  ?OT Frequency: Min 2X/week ?  ? ?Co-evaluation   ?  ?  ?  ?  ? ?  ?AM-PAC OT "6 Clicks" Daily Activity     ?Outcome Measure Help from another person eating meals?: A Little ?Help from another person taking care of personal grooming?: A Little ?Help from another person toileting, which includes using toliet, bedpan, or urinal?: A Little ?Help from another person bathing (including washing, rinsing, drying)?: A Little ?Help from another person to put on and taking off regular upper body clothing?: A Little ?Help from another person to put on and taking off regular lower body clothing?: A Little ?6 Click Score: 18 ?  ?End of Session Equipment Utilized During Treatment: Gait belt ?Nurse Communication: Mobility status ? ?Activity Tolerance: Patient tolerated treatment well ?Patient left: in bed;with call bell/phone within reach;with bed alarm set ? ?OT Visit Diagnosis: Unsteadiness on feet (R26.81);Other abnormalities of gait and mobility (R26.89);Muscle weakness (generalized) (M62.81)  ?              ?Time: 6144-3154 ?OT Time  Calculation (min): 17 min ?Charges:  OT General Charges ?$OT Visit: 1 Visit ?OT Evaluation ?$OT Eval Moderate Complexity: 1 Mod ? ?Leota Maka H., OTR/L ?Acute Rehabilitation ? ?Saher Davee Elane Yolanda Bonine ?12/26/2021, 9:18 PM ?

## 2021-12-26 NOTE — Evaluation (Cosign Needed)
Speech Language Pathology Evaluation ?Patient Details ?Name: [name] ?MRN: [id number] ?DOB: April 30, 1946 ?Today's Date: 12/26/2021 ?Time: 8756-4332 ?SLP Time Calculation (min) (ACUTE ONLY): 15 min ? ?Problem List:  ?Patient Active Problem List  ? Diagnosis Date Noted  ? Sepsis ([place]) 12/26/2021  ? UTI (urinary tract infection) 12/26/2021  ? Aspiration pneumonia ([place]) 12/26/2021  ? Acute renal failure superimposed on stage 4 chronic kidney disease ([place]) 12/26/2021  ? Acute on chronic systolic CHF (congestive heart failure) ([place]) 12/26/2021  ? Stroke ([place]) 12/25/2021  ? Pharyngeal dysphagia 07/12/2021  ? Pressure ulcer of sacral region 07/12/2021  ? Wound of abdomen 07/12/2021  ? Closed fracture of distal end of left radius 06/15/2021  ? Degeneration of lumbar intervertebral disc 04/23/2021  ? Hyponatremia 11/16/2020  ? Metabolic acidosis, NAG, failure of bicarbonate regeneration 11/16/2020  ? Nausea & vomiting 11/16/2020  ? Acute-on-chronic kidney injury ([place]) 11/16/2020  ? Abdominal aortic atherosclerosis ([place]) 06/01/2020  ? Acquired thrombophilia ([place]) 06/01/2020  ? Insomnia 05/08/2020  ? Neck pain 01/11/2020  ? Referred otalgia of both ears 01/11/2020  ? Frequency of micturition 12/16/2019  ? Orthostatic hypotension 12/16/2019  ? Urinary tract infectious disease 12/16/2019  ? Pneumonia due to coronavirus disease 2019 11/17/2019  ? Acute combined systolic and diastolic heart failure ([place]) 10/29/2019  ? COVID-19 10/29/2019  ? Hyperkalemia 10/16/2019  ? Bilateral pneumonia 10/16/2019  ? Acute metabolic encephalopathy 95/18/8416  ? Noninfective gastroenteritis and colitis, unspecified 09/01/2019  ? Hypo-osmolality and hyponatremia 08/04/2019  ? Spasm 06/03/2019  ? Seizures ([place]) 01/07/2019  ? Cerebrovascular accident (CVA) due to occlusion of left carotid artery ([place]) 01/07/2019  ? CAD, multiple vessel 01/07/2019  ? H/O ischemic bowel disease 01/07/2019  ? Protein calorie malnutrition ([place]) 01/04/2019  ? Acute  bronchitis 12/04/2018  ? Oral phase dysphagia 12/04/2018  ? Personal history of transient ischemic attack (TIA), and cerebral infarction without residual deficits 11/11/2018  ? CKD (chronic kidney disease) stage 3, GFR 30-59 ml/min 11/04/2018  ? Seizure ([place]) 11/03/2018  ? Anorectal disorder 06/08/2018  ? 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 Localized edema 03/02/2018  ? Tachycardia 03/02/2018  ? Dermatitis 02/12/2018  ? 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 Scar of forehead 01/06/2018  ? Traumatic ulceration of tongue 12/04/2017  ? Ileostomy in place [facility]) 10/23/2017  ? Pressure injury of skin 10/19/2017  ? 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  Ileostomy 10/18/2017 10/14/2017  ? Ischemic colitis ([place]) 10/14/2017  ? Acute respiratory failure with hypoxemia ([place]) 10/14/2017  ? Acute post-operative pain 10/14/2017  ? Acute kidney injury superimposed on CKD ([place]) 10/14/2017  ? Anemia 10/14/2017  ? Benign paroxysmal positional vertigo 07/29/2017  ? Nocturia more than twice per night 05/07/2017  ? Diabetes due to underlying condition w oth circulatory comp ([place]) 05/07/2017  ? Atherosclerotic PVD with intermittent claudication ([place]) 05/07/2017  ? Poor sleep hygiene 05/07/2017  ? Paradoxical insomnia 05/07/2017  ? Abnormal feces 04/10/2017  ? Hilar lymphadenopathy   ? Hemoptysis 02/19/2017  ? Pulmonary nodules/lesions, multiple 02/19/2017  ? Tobacco use 02/19/2017  ? Disorder of arteries and arterioles, unspecified ([place]) 01/31/2017  ? Lung field abnormal 01/31/2017  ? RUQ abdominal pain 01/13/2017  ? Restless legs syndrome 11/28/2016  ? Left-sided extracranial carotid artery occlusion 11/26/2016  ? PAOD (peripheral arterial occlusive disease) ([place]) 11/26/2016  ? Encounter for routine follow-up after surgery of the circulatory system 11/26/2016  ? Abnormal weight loss 01/29/2016  ? Allergic rhinitis 01/29/2016  ? Chest pain with moderate risk of acute  coronary syndrome 10/27/2015  ? Other slipping, tripping  and stumbling without falling, initial encounter 07/28/2015  ? Acid indigestion 12/31/2013  ? Pain in left hip 08/26/2013  ? Screening for gout 05/13/2013  ? Disorder of skin or subcutaneous tissue 12/22/2012  ? Difficult or painful urination 09/02/2012  ? Peripheral blood vessel disorder ([place]) 05/11/2012  ? 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 Follow-up examination, following unspecified surgery 05/11/2012  ? Occlusion and stenosis of carotid artery without mention of cerebral infarction 04/14/2012  ? Fatigue 09/24/2011  ? Gonalgia 07/04/2011  ? Avitaminosis D 05/02/2011  ? Chronic pancreatitis ([place]) 04/30/2011  ? Vitamin B12 deficiency 04/30/2011  ? Intestinal motility disorder 04/30/2011  ? Fatty liver 04/30/2011  ? Adiposity 04/30/2011  ? Other and unspecified general psychiatric examination 04/26/2011  ? Abdominal pain 04/09/2011  ? Iron deficiency anemia, unspecified  04/09/2011  ? Other general symptoms  04/09/2011  ? Asthma 04/09/2011  ? Type 2 diabetes mellitus with diabetic peripheral angiopathy without gangrene ([place]) 01/24/2011  ? Abnormal weight gain 11/30/2010  ? Dyslipidemia   ? Dizziness   ? Right-sided extracranial carotid artery stenosis   ? Normal nuclear stress test   ? Feeling bilious 07/11/2010  ? Underimmunization status 07/11/2010  ? Pancreas (digestive gland) works poorly 05/30/2010  ? B12 deficiency 03/19/2010  ? DVT 03/16/2010  ? BLIND LOOP SYNDROME 03/16/2010  ? Diarrhea 03/16/2010  ? Chronic depression 02/23/2010  ? Major depression, single episode 02/23/2010  ? Cough 11/17/2009  ? Plantar verruca 11/17/2009  ? Arteriosclerosis of coronary artery 11/16/2009  ? Claudication ([place]) 11/16/2009  ? Compulsive tobacco user syndrome 11/16/2009  ? HLD (hyperlipidemia) 11/16/2009  ? Nontoxic single thyroid nodule 11/16/2009  ? Obstructive apnea 11/16/2009  ? Radial styloid tenosynovitis 11/16/2009  ? LBP (low back pain) 11/16/2009  ? Gastroparesis 11/16/2009  ? LAXATIVE ABUSE  12/17/2007  ? BP (high blood pressure) 12/17/2007  ? Acid reflux 12/17/2007  ? HIATAL HERNIA 12/17/2007  ? IBS 12/17/2007  ? MELANOSIS COLI 12/17/2007  ? ARTHRITIS 12/17/2007  ? HEPATOMEGALY 12/17/2007  ? ?Past Medical History:  ?Past Medical History:  ?Diagnosis Date  ? Allergy   ? Anemia   ? past hx per pt   ? Anxiety   ? Arthritis   ? back   ? Arthropathy, unspecified, site unspecified   ? Blind loop syndrome   ? Carotid artery disease ([place])   ? documented occlusion of left ICA  ? Cataract   ? removed both eyes   ? Clotting disorder ([place])   ? Hx Clot in leg per pt   ? Colostomy present ([place])   ? Coronary artery disease   ? a. known 60-70% mid-LAD stenosis with caths in 1999, 2002, 2006, and 2012 showing stable anatomy b. low-risk NST in 10/2015  ? Depression   ? Diabetes insipidus ([place])   ? Diabetes mellitus   ? Diverticulosis   ? Dizziness   ? chronic  ? Dyslipidemia   ? Dyspnea   ? with exertion  ? Fatty liver   ? GERD (gastroesophageal reflux disease)   ? hiatial hernia  ? Heart murmur   ? Hepatomegaly   ? Hiatal hernia   ? HTN (hypertension)   ? Hyperlipidemia   ? Hypertension   ? Irritable bowel syndrome   ? Melanosis coli   ? Metatarsal fracture right  ? Normal nuclear stress test   ? nml 01/03/10;06/19/07  ? Other, mixed, or unspecified nondependent drug abuse, unspecified   ?  Pancreatitis, chronic ([place])   ? Peripheral vascular disease ([place]) 02/20/10  ? s/p stent of left SFA  ? Seizure ([place]) 11/04/2018  ? last 1 yr 2020 per pt unsure month   ? Sleep apnea   ? no cpap   ? Stroke [facility])   ? Thyroid nodule   ? Unspecified essential hypertension   ? Vitamin B12 deficiency   ? ?Past Surgical History:  ?Past Surgical History:  ?Procedure Laterality Date  ? ABDOMINAL HYSTERECTOMY    ? partial  ? AUGMENTATION MAMMAPLASTY    ? bilateral 1976 retro   ? BREAST ENHANCEMENT SURGERY    ? CARDIAC CATHETERIZATION  07/07/98;08/31/01;03/04/05  ? 60 -70% LAD  ? CATARACT EXTRACTION Bilateral   ? COLON RESECTION N/A 10/14/2017  ?  Procedure: EXPLORATORY LAPAROTOMY, EXTENDED RIGHT COLECTOMY; APPLICATION OF ABDOMINAL VACUUM DRESSING;  Surgeon: Jovita Kussmaul, MD;  Location: [facility];  Service: General;  Laterality: N/A;  ? COLONOSCOPY    ?

## 2021-12-26 NOTE — Progress Notes (Signed)
Carotid duplex has been completed.  ? ?Preliminary results in CV Proc.  ? ?Dynasia Kercheval Aubrynn Katona ?12/26/2021 11:56 AM    ?

## 2021-12-26 NOTE — Progress Notes (Signed)
Pt admitted from ED with c/o of AMS and Stroke diagnosis, pt alert and oriented, c/o of slight back pain, settled in bed with call light at bedside, tele monitor put and verified on pt, safety concern addressed as well, was however reassured and will continue to monitor, v/s stable. /Obasogie-Asidi, Carmelle Bamberg Efe ? ?

## 2021-12-26 NOTE — Progress Notes (Signed)
? ?  Echocardiogram ?2D Echocardiogram has been performed. ? ?[name] ?12/26/2021, 9:32 AM ?

## 2021-12-26 NOTE — Assessment & Plan Note (Signed)
-   Patient with history of seizures.  Reportedly per daughter has ran out of her Keppra for 2 weeks.  Neurology suspects AMS could be secondary to to seizure activity.  Has given IV Keppra load in the ED and will resume her oral Keppra 500 BID ?

## 2021-12-26 NOTE — Consult Note (Addendum)
?Hospital Consult ? ? ? ?Reason for Consult:  Left ICA occlusion. CVA with right side carotid stenosis ?Requesting Physician:  Janine Ores, NP ?MRN #:  [id number] ? ?History of Present Illness: This is a 76 y.o. female with pertinent past medical history including Tyle II Diabetes Mellitus, CVA, seizures, COPD, DVT, CKD stage IV, HTN, HLD, DVT, PAD and Left ICA occlusion. She presented via EMS with AMS. She is does not recall events leading up to arriving at hospital. She says she had someone scheduled to clean her carpets at her house and they found her unresponsive.  ? ?Workup showing UA with concern of UTA, CXR concerning for possible pneumonia. Patient also has been out of her anti seizure medications for 2 weeks which could be contributing to her current symptoms. MRI Brain showing right punctate infarct in the superior caudate/ corona radiata. Carotid duplex showing chronically occluded left ICA stenosis and right ICA stenosis of 60-79%.  ? ?Ms. Gavidia is familiar to [facility] as prior patient of Dr. Evelena Leyden and most recently seen by Dr. Virl Cagey. She has known chronic left ICA occlusion and has had asymptomatic right ICA stenosis of 60-79%. She has been managed on aspirin, statin and Plavix. At baseline patient has difficulty speaking secondary to swollen tongue which she says has been present for approximately 4 years. She presently feels almost back to baseline but feels she is not able to remember things. She denies any visual changes, facial drooping, unilateral upper or lower extremity weakness or numbness.  ? ?Past Medical History:  ?Diagnosis Date  ? Allergy   ? Anemia   ? past hx per pt   ? Anxiety   ? Arthritis   ? back   ? Arthropathy, unspecified, site unspecified   ? Blind loop syndrome   ? Carotid artery disease ([place])   ? documented occlusion of left ICA  ? Cataract   ? removed both eyes   ? Clotting disorder ([place])   ? Hx Clot in leg per pt   ? Colostomy present ([place])   ? Coronary artery disease   ? a.  known 60-70% mid-LAD stenosis with caths in 1999, 2002, 2006, and 2012 showing stable anatomy b. low-risk NST in 10/2015  ? Depression   ? Diabetes insipidus ([place])   ? Diabetes mellitus   ? Diverticulosis   ? Dizziness   ? chronic  ? Dyslipidemia   ? Dyspnea   ? with exertion  ? Fatty liver   ? GERD (gastroesophageal reflux disease)   ? hiatial hernia  ? Heart murmur   ? Hepatomegaly   ? Hiatal hernia   ? HTN (hypertension)   ? Hyperlipidemia   ? Hypertension   ? Irritable bowel syndrome   ? Melanosis coli   ? Metatarsal fracture right  ? Normal nuclear stress test   ? nml 01/03/10;06/19/07  ? Other, mixed, or unspecified nondependent drug abuse, unspecified   ? Pancreatitis, chronic ([place])   ? Peripheral vascular disease ([place]) 02/20/10  ? s/p stent of left SFA  ? Seizure ([place]) 11/04/2018  ? last 1 yr 2020 per pt unsure month   ? Sleep apnea   ? no cpap   ? Stroke [facility])   ? Thyroid nodule   ? Unspecified essential hypertension   ? Vitamin B12 deficiency   ? ? ?Past Surgical History:  ?Procedure Laterality Date  ? ABDOMINAL HYSTERECTOMY    ? partial  ? AUGMENTATION MAMMAPLASTY    ? bilateral 1976 retro   ?  BREAST ENHANCEMENT SURGERY    ? CARDIAC CATHETERIZATION  07/07/98;08/31/01;03/04/05  ? 60 -70% LAD  ? CATARACT EXTRACTION Bilateral   ? COLON RESECTION N/A 10/14/2017  ? Procedure: EXPLORATORY LAPAROTOMY, EXTENDED RIGHT COLECTOMY; APPLICATION OF ABDOMINAL VACUUM DRESSING;  Surgeon: Jovita Kussmaul, MD;  [facility];  Service: General;  Laterality: N/A;  ? COLONOSCOPY    ? ENDOBRONCHIAL ULTRASOUND Bilateral 02/24/2017  ? Procedure: ENDOBRONCHIAL ULTRASOUND;  Surgeon: Collene Gobble, MD;  Location: [facility];  Service: Cardiopulmonary;  Laterality: Bilateral;  ? FEMORAL ARTERY STENT    ? Left leg  ? LAPAROTOMY N/A 10/16/2017  ? Procedure: EXPLORATORY LAPAROTOMY, PARTIAL OMENTECTOMY, RESECTION ISCHEMIC ILEUM 166AY, APPLICATION OF VAC ABDOMINAL DRESSING;  Surgeon: Armandina Gemma, MD;  Location: [facility];  Service:  General;  Laterality: N/A;  ? LAPAROTOMY N/A 10/18/2017  ? Procedure: RE-EXPLORATION OF ABDOMEN, ILEOSTOMY CREATION;  Surgeon: Stark Klein, MD;  [facility]: [facility];  Service: General;  Laterality: N/A;  ? LUNG SURGERY  03/2017  ? Benign polyps removed  ? PATELLA REALIGNMENT Left   ? POLYPECTOMY    ? SIGMOIDOSCOPY    ? TONSILLECTOMY    ? ? ?Allergies  ?Allergen Reactions  ? Adhesive [Tape] Other (See Comments)  ?  "Took off my skin"  ? Other Rash  ?  "Took off my skin"  ? Ace Inhibitors Other (See Comments)  ?  Other reaction(s): Unknown  ? Sulfa Antibiotics   ?  Other reaction(s): Unknown  ? Topiramate Other (See Comments)  ? Codeine Other (See Comments)  ?  Altered mental state   ? Iodinated Contrast Media Rash  ? ? ?Prior to Admission medications   ?Medication Sig Start Date End Date Taking? Authorizing Provider  ?acetaminophen (TYLENOL) 325 MG tablet Take 2 tablets by mouth as needed.    [provider]  ?albuterol (VENTOLIN HFA) 108 (90 Base) MCG/ACT inhaler Inhale 1-2 puffs into the lungs as needed. 03/30/21   [provider]  ?amLODipine (NORVASC) 10 MG tablet Take 10 mg by mouth daily.    [provider]  ?ARIPiprazole (ABILIFY) 5 MG tablet Take 5 mg by mouth daily. 03/30/21   [provider]  ?aspirin 81 MG EC tablet Take 1 tablet by mouth as needed. 04/17/21   [provider]  ?atorvastatin (LIPITOR) 20 MG tablet Take 20 mg by mouth daily.    [provider]  ?benzonatate (TESSALON) 100 MG capsule Take 100 mg by mouth as needed. 03/19/21   [provider]  ?cholestyramine (QUESTRAN) 4 g packet Take 1 packet (4 g total) by mouth 2 (two) times daily. 06/08/21   Ladene Artist, MD  ?clopidogrel (PLAVIX) 75 MG tablet Take 1 tablet by mouth daily.    [provider]  ?[facility] DIGESTIVE PROBIOTIC 250 MG capsule Take 500 mg by mouth daily. 06/27/21   [provider]  ?cyclobenzaprine (FLEXERIL) 10 MG tablet Take 0.5 tablets (5 mg total) by  mouth 3 (three) times daily as needed for muscle spasms. 11/23/20   Elgergawy, Silver Huguenin, MD  ?diphenoxylate-atropine (LOMOTIL) 2.5-0.025 MG tablet Take 1 tablet by mouth 3 (three) times daily. 08/27/19   Domenic Polite, MD  ?ferrous sulfate 220 (44 Fe) MG/5ML solution Take 5 mLs (220 mg total) by mouth 2 (two) times daily with a meal. 11/23/20   Elgergawy, Silver Huguenin, MD  ?fluticasone (FLONASE) 50 MCG/ACT nasal spray Place 2 sprays into both nostrils as needed. 01/29/16   [provider]  ?furosemide (LASIX) 20 MG  tablet Take 20 mg by mouth daily. 08/06/21   [provider]  ?HYDROcodone-acetaminophen (NORCO) 10-325 MG tablet Take 1 tablet by mouth 4 (four) times daily as needed. 07/24/21   [provider]  ?levETIRAcetam (KEPPRA) 500 MG tablet TAKE 1 TABLET BY MOUTH TWICE A DAY 01/20/20   Dohmeier, Asencion Partridge, MD  ?loperamide (IMODIUM) 2 MG capsule Take 2 mg by mouth daily as needed for diarrhea or loose stools.     [provider]  ?losartan (COZAAR) 100 MG tablet TAKE 1/2 A TABLET BY MOUTH ONCE DAILY 10/05/21   [place], Peter M, MD  ?magnesium oxide (MAG-OX) 400 MG tablet Take 1 tablet by mouth daily. 05/08/20   [provider]  ?metoprolol tartrate (LOPRESSOR) 100 MG tablet Take 1 tablet (100 mg total) by mouth 2 (two) times daily. 11/23/20   Elgergawy, Silver Huguenin, MD  ?mirtazapine (REMERON) 15 MG tablet Take 15 mg by mouth at bedtime. 07/18/21   [provider]  ?montelukast (SINGULAIR) 10 MG tablet Take 10 mg by mouth daily. 08/09/18   [provider]  ?nitroGLYCERIN (NITROSTAT) 0.4 MG SL tablet Place 1 tablet (0.4 mg total) under the tongue every 5 (five) minutes as needed for chest pain. 11/08/15   [place], Peter M, MD  ?ondansetron (ZOFRAN) 4 MG tablet Take 1 tablet (4 mg total) by mouth every 6 (six) hours as needed for nausea or vomiting. 10/24/21   Esterwood, Amy S, PA-C  ?ondansetron (ZOFRAN-ODT) 4 MG disintegrating tablet Dissolve 1 tablet on tongue twice a  day for 7 days then decrease to 1 tablet every 6-8 hours as needed. 11/28/20   Noralyn Pick, NP  ?Pancrelipase, Lip-Prot-Amyl, (ZENPEP) 40000-126000 units CPEP Take 2 capsules with meals and one capsu

## 2021-12-26 NOTE — Assessment & Plan Note (Signed)
-   Chest x-ray shows right lung infiltrate which would be in line for suspected seizure activity prior to admission.  Continue IV Rocephin and azithromycin. ?

## 2021-12-26 NOTE — Assessment & Plan Note (Signed)
-  LE edema noted on exam although no findings of pulmonary edema on CXR. Possible could have missed her Lasix in the past week. Although will hold on diuretics for now with sepsis and getting judicious fluid bolus.  ?

## 2021-12-26 NOTE — Assessment & Plan Note (Signed)
-  Presented with fever and leukocytosis  ?-secondary to aspiration pneumonia and suspected UTI. ?-unable to receive full 30cc/kg given she appears clinically overloaded ?

## 2021-12-26 NOTE — [place] FL2 (Signed)
?[place] MEDICAID FL2 LEVEL OF CARE SCREENING TOOL  ?  ? ?IDENTIFICATION  ?Patient Name: ?[name] Birthdate: 04-07-1946 Sex: female Admission Date (Current Location): ?12/25/2021  ?[place] and [place] Number: ? [place] ?  Facility and Address:  ?[facility], [place] [address], [place], [place] [address] ?     Provider Number: ?[id number]  ?Attending Physician Name and Address:  ?Barb Merino, MD ? Relative Name and Phone Number:  ?  ?   ?Current Level of Care: ?Hospital Recommended Level of Care: ?[place] Prior Approval Number: ?  ? ?Date Approved/Denied: ?  PASRR Number: ?[id number] A ? ?Discharge Plan: ?SNF ?  ? ?Current Diagnoses: ?Patient Active Problem List  ? Diagnosis Date Noted  ? Sepsis ([place]) 12/26/2021  ? UTI (urinary tract infection) 12/26/2021  ? Aspiration pneumonia ([place]) 12/26/2021  ? Acute renal failure superimposed on stage 4 chronic kidney disease ([place]) 12/26/2021  ? Acute on chronic systolic CHF (congestive heart failure) ([place]) 12/26/2021  ? Stroke ([place]) 12/25/2021  ? Pharyngeal dysphagia 07/12/2021  ? Pressure ulcer of sacral region 07/12/2021  ? Wound of abdomen 07/12/2021  ? Closed fracture of distal end of left radius 06/15/2021  ? Degeneration of lumbar intervertebral disc 04/23/2021  ? Hyponatremia 11/16/2020  ? Metabolic acidosis, NAG, failure of bicarbonate regeneration 11/16/2020  ? Nausea & vomiting 11/16/2020  ? Acute-on-chronic kidney injury ([place]) 11/16/2020  ? Abdominal aortic atherosclerosis ([place]) 06/01/2020  ? Acquired thrombophilia ([place]) 06/01/2020  ? Insomnia 05/08/2020  ? Neck pain 01/11/2020  ? Referred otalgia of both ears 01/11/2020  ? Frequency of micturition 12/16/2019  ? Orthostatic hypotension 12/16/2019  ? Urinary tract infectious disease 12/16/2019  ? Pneumonia due to coronavirus disease 2019 11/17/2019  ? Acute combined systolic and diastolic heart failure ([place]) 10/29/2019  ? COVID-19 10/29/2019  ? Hyperkalemia 10/16/2019   ? Bilateral pneumonia 10/16/2019  ? Acute metabolic encephalopathy 13/24/4010  ? Noninfective gastroenteritis and colitis, unspecified 09/01/2019  ? Hypo-osmolality and hyponatremia 08/04/2019  ? Spasm 06/03/2019  ? Seizures ([place]) 01/07/2019  ? Cerebrovascular accident (CVA) due to occlusion of left carotid artery ([place]) 01/07/2019  ? CAD, multiple vessel 01/07/2019  ? H/O ischemic bowel disease 01/07/2019  ? Protein calorie malnutrition ([place]) 01/04/2019  ? Acute bronchitis 12/04/2018  ? Oral phase dysphagia 12/04/2018  ? Personal history of transient ischemic attack (TIA), and cerebral infarction without residual deficits 11/11/2018  ? CKD (chronic kidney disease) stage 3, GFR 30-59 ml/min 11/04/2018  ? Seizure ([place]) 11/03/2018  ? Anorectal disorder 06/08/2018  ? 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 Localized edema 03/02/2018  ? Tachycardia 03/02/2018  ? Dermatitis 02/12/2018  ? 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 Scar of forehead 01/06/2018  ? Traumatic ulceration of tongue 12/04/2017  ? Ileostomy in place [facility]) 10/23/2017  ? Pressure injury of skin 10/19/2017  ? 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  Ileostomy 10/18/2017 10/14/2017  ? Ischemic colitis ([place]) 10/14/2017  ? Acute respiratory failure with hypoxemia ([place]) 10/14/2017  ? Acute post-operative pain 10/14/2017  ? Acute kidney injury superimposed on CKD ([place]) 10/14/2017  ? Anemia 10/14/2017  ? Benign paroxysmal positional vertigo 07/29/2017  ? Nocturia more than twice per night 05/07/2017  ? Diabetes due to underlying condition w oth circulatory comp ([place]) 05/07/2017  ? Atherosclerotic PVD with intermittent claudication ([place]) 05/07/2017  ? Poor sleep hygiene 05/07/2017  ? Paradoxical insomnia 05/07/2017  ? Abnormal feces 04/10/2017  ? [place] lymphadenopathy   ? Hemoptysis 02/19/2017  ?  Pulmonary nodules/lesions, multiple 02/19/2017  ? Tobacco use 02/19/2017  ? Disorder of arteries and arterioles, unspecified ([place]) 01/31/2017  ? Lung field  abnormal 01/31/2017  ? RUQ abdominal pain 01/13/2017  ? Restless legs syndrome 11/28/2016  ? Left-sided extracranial carotid artery occlusion 11/26/2016  ? PAOD (peripheral arterial occlusive disease) ([place]) 11/26/2016  ? Encounter for routine follow-up after surgery of the circulatory system 11/26/2016  ? Abnormal weight loss 01/29/2016  ? Allergic rhinitis 01/29/2016  ? Chest pain with moderate risk of acute coronary syndrome 10/27/2015  ? Other slipping, tripping and stumbling without falling, initial encounter 07/28/2015  ? Acid indigestion 12/31/2013  ? Pain in left hip 08/26/2013  ? Screening for gout 05/13/2013  ? Disorder of skin or subcutaneous tissue 12/22/2012  ? Difficult or painful urination 09/02/2012  ? Peripheral blood vessel disorder ([place]) 05/11/2012  ? 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 Follow-up examination, following unspecified surgery 05/11/2012  ? Occlusion and stenosis of carotid artery without mention of cerebral infarction 04/14/2012  ? Fatigue 09/24/2011  ? Gonalgia 07/04/2011  ? Avitaminosis D 05/02/2011  ? Chronic pancreatitis ([place]) 04/30/2011  ? Vitamin B12 deficiency 04/30/2011  ? Intestinal motility disorder 04/30/2011  ? Fatty liver 04/30/2011  ? Adiposity 04/30/2011  ? Other and unspecified general psychiatric examination 04/26/2011  ? Abdominal pain 04/09/2011  ? Iron deficiency anemia, unspecified  04/09/2011  ? Other general symptoms  04/09/2011  ? Asthma 04/09/2011  ? Type 2 diabetes mellitus with diabetic peripheral angiopathy without gangrene ([place]) 01/24/2011  ? Abnormal weight gain 11/30/2010  ? Dyslipidemia   ? Dizziness   ? Right-sided extracranial carotid artery stenosis   ? Normal nuclear stress test   ? Feeling bilious 07/11/2010  ? Underimmunization status 07/11/2010  ? Pancreas (digestive gland) works poorly 05/30/2010  ? B12 deficiency 03/19/2010  ? DVT 03/16/2010  ? BLIND LOOP SYNDROME 03/16/2010  ? Diarrhea 03/16/2010  ? Chronic depression  02/23/2010  ? Major depression, single episode 02/23/2010  ? Cough 11/17/2009  ? Plantar verruca 11/17/2009  ? Arteriosclerosis of coronary artery 11/16/2009  ? Claudication ([place]) 11/16/2009  ? Compulsive tobacco user syndrome 11/16/2009  ? HLD (hyperlipidemia) 11/16/2009  ? Nontoxic single thyroid nodule 11/16/2009  ? Obstructive apnea 11/16/2009  ? Radial styloid tenosynovitis 11/16/2009  ? LBP (low back pain) 11/16/2009  ? Gastroparesis 11/16/2009  ? LAXATIVE ABUSE 12/17/2007  ? BP (high blood pressure) 12/17/2007  ? Acid reflux 12/17/2007  ? HIATAL HERNIA 12/17/2007  ? IBS 12/17/2007  ? MELANOSIS COLI 12/17/2007  ? ARTHRITIS 12/17/2007  ? HEPATOMEGALY 12/17/2007  ? ? ?Orientation RESPIRATION BLADDER Height & Weight   ?  ?Self, Place ? Normal Incontinent Weight: 164 lb 10.9 oz (74.7 kg) ?Height:  '5\' 3"'$  (160 cm)  ?BEHAVIORAL SYMPTOMS/MOOD NEUROLOGICAL BOWEL NUTRITION STATUS  ?    Continent Diet (heart healthy)  ?AMBULATORY STATUS COMMUNICATION OF NEEDS Skin   ?Limited Assist Verbally Normal ?  ?  ?  ?    ?     ?     ? ? ?Personal Care Assistance Level of Assistance  ?Bathing, Feeding, Dressing Bathing Assistance: Limited assistance ?Feeding assistance: Limited assistance ?Dressing Assistance: Limited assistance ?   ? ?Functional Limitations Info  ?    ?  ?   ? ? ?SPECIAL CARE FACTORS FREQUENCY  ?PT (By licensed PT), OT (By licensed OT)   ?  ?PT Frequency: 5x/wk ?OT Frequency: 5x/wk ?  ?  ?  ?   ? ? ?Contractures  Contractures Info: Not present  ? ? ?Additional Factors Info  ?Code Status, Allergies Code Status Info: Full ?Allergies Info: Adhesive (Tape), Other, Ace Inhibitors, Sulfa Antibiotics, Topiramate, Codeine, Iodinated Contrast Media ?  ?  ?  ?   ? ?Current Medications (12/26/2021):  This is the current hospital active medication list ?Current Facility-Administered Medications  ?Medication Dose Route Frequency Provider Last Rate Last Admin  ? acetaminophen (TYLENOL) tablet 650 mg  650 mg Oral Q4H PRN Tu, Ching  T, DO   650 mg at 12/26/21 0146  ? Or  ? acetaminophen (TYLENOL) 160 MG/5ML solution 650 mg  650 mg Per Tube Q4H PRN Ileene Musa T, DO      ? Or  ? acetaminophen (TYLENOL) suppository 650 mg  650 mg Rectal

## 2021-12-26 NOTE — Assessment & Plan Note (Addendum)
-   Creatinine of 2.04 from prior of 1.64. Follow repeat after fluid overnight.  ?-avoid nephrotoxic agent  ?

## 2021-12-26 NOTE — Progress Notes (Addendum)
STROKE TEAM PROGRESS NOTE  ? ?ATTENDING NOTE: ?I reviewed above note and agree with the assessment and plan. Pt was seen and examined.  ? ?76 year old female with history of hypertension, hyperlipidemia, CAD, PVD, chronic left ICA occlusion, stroke, seizure on Keppra admitted for altered mental status, encephalopathy, speech difficulty with perseveration. ? ?In 10/2017 patient admitted for ischemic bowel with hypotension.  Found to have left facial droop, MRI showed bilateral embolic infarcts left more than right, concerning for hypoperfusion from septic shock.  10 days later, patient readmitted for 2 episodes of tonic-clonic seizure, EEG negative, put on Keppra. ? ?On this admission CT no acute abnormality.  MRI showed right punctate CR infarct.  MRI chronic left ICA occlusion.  Carotid Doppler in 10/2021 showed right 60 to 79% stenosis.  Repeat carotid Doppler at this time again showed chronic left ICA occlusion, right ICA 60 to 79% stenosis.  EF 45 to 50%.  EEG pending.  CXR showed right lung pneumonia, UA WBC 6-10, urine culture pending.  LDL 25, A1c pending, creatinine 2.04, WBC 18.6. ? ?Patient pontine stroke at right CR cannot explaining patient's symptoms and likely to be incidental finding.  Etiology for stroke likely small vessel disease given location versus hypoperfusion from carotid stenosis in the setting of infection/sepsis.  Recommend continue aspirin and Plavix DAPT home medication.  LDL low, decrease Lipitor 20 to Lipitor 10.  Avoid low BP given left ICA occlusion and right ICA high-grade stenosis, BP goal 130-160.  ? ?Vascular surgery Dr. Virl Cagey on board.  Agree with possible future right carotid revascularization given current event likely incidental small vessel disease and the patient current medical condition not suitable for surgical intervention.  Recommend to continue follow-up with Dr. Virl Cagey as outpatient.   ? ?Patient presenting symptoms most likely due to metabolic encephalopathy in the  setting of pneumonia, UTI.  Currently on Rocephin and azithromycin.  Will repeat EEG to rule out seizure.  Continue medical management per primary team.  Will follow ? ?For detailed assessment and plan, please refer to above as I have made changes wherever appropriate.  ? ?Rosalin Hawking, MD PhD ?Stroke Neurology ?12/26/2021 ?5:06 PM ? ?I discussed with Dr. Sloan Leiter and Dr. Virl Cagey. I spent extensive time with the patient, more than 50% of which was spent in counseling and coordination of care, reviewing medical chart and test results, images and medication, and discussing the diagnosis, treatment plan and potential prognosis. This patient's care requiresreview of multiple databases, neurological assessment, discussion with family, other specialists and medical decision making of high complexity. ? ?  ? ? ?INTERVAL HISTORY ?Does not remember what happened yesterday. Slight left facial droop. Left foot weakness with dorsiflexion. Tongue swelling for years which affects her speech at baseline.  ?She had workup with UA concerning for a ? UTI, CXR concerning for R lung Pneumonia. She had MRI brain showed a R caudate stroke. WBC 18.6 ? ?Vascular surgery consult for chronic L ICA and R ICA stenosis, most recently seen by Dr. Virl Cagey outpatient.  ? ?Vitals:  ? 12/26/21 0030 12/26/21 0057 12/26/21 0104 12/26/21 0318  ?BP: (!) 161/82 (!) 178/75  (!) 157/57  ?Pulse: 95 100  85  ?Resp: '20 17  18  '$ ?Temp:  98.6 ?F (37 ?C)  97.6 ?F (36.4 ?C)  ?TempSrc:  Oral    ?SpO2: 92% 96%  93%  ?Weight:   74.7 kg   ?Height:   '5\' 3"'$  (1.6 m)   ? ?CBC:  ?Recent Labs  ?Lab 12/25/21 ?1518  ?  WBC 18.6*  ?NEUTROABS 16.1*  ?HGB 12.6  ?HCT 38.7  ?MCV 94.9  ?PLT 227  ? ?Basic Metabolic Panel:  ?Recent Labs  ?Lab 12/25/21 ?1518  ?NA 136  ?K 5.0  ?CL 106  ?CO2 20*  ?GLUCOSE 110*  ?BUN 19  ?CREATININE 2.04*  ?CALCIUM 8.9  ?MG 1.1*  ? ?Lipid Panel:  ?Recent Labs  ?Lab 12/26/21 ?0306  ?CHOL 100  ?TRIG 78  ?HDL 59  ?CHOLHDL 1.7  ?VLDL 16  ?[place] 25  ? ?HgbA1c: No  results for input(s): HGBA1C in the last 168 hours. ?Urine Drug Screen: No results for input(s): LABOPIA, COCAINSCRNUR, LABBENZ, AMPHETMU, THCU, LABBARB in the last 168 hours.  ?Alcohol Level No results for input(s): ETH in the last 168 hours. ? ?IMAGING past 24 hours ?CT HEAD WO CONTRAST (5MM) ? ?Result Date: 12/25/2021 ?CLINICAL DATA:  Altered level of consciousness EXAM: CT HEAD WITHOUT CONTRAST TECHNIQUE: Contiguous axial images were obtained from the base of the skull through the vertex without intravenous contrast. RADIATION DOSE REDUCTION: This exam was performed according to the departmental dose-optimization program which includes automated exposure control, adjustment of the mA and/or kV according to patient size and/or use of iterative reconstruction technique. COMPARISON:  10/15/2019 FINDINGS: Brain: No acute infarct or hemorrhage. Chronic small vessel ischemic changes are again seen within the periventricular white matter, most pronounced within the left parietal region. Lateral ventricles and midline structures are stable. No acute extra-axial fluid collections. No mass effect. Vascular: No hyperdense vessel or unexpected calcification. Skull: Normal. Negative for fracture or focal lesion. Sinuses/Orbits: No acute finding. Other: None. IMPRESSION: 1. No acute intracranial process. 2. Stable chronic small-vessel ischemic changes. Electronically Signed   By: Randa Ngo M.D.   On: 12/25/2021 16:02  ? ?MR ANGIO HEAD WO CONTRAST ? ?Result Date: 12/26/2021 ?CLINICAL DATA:  76 year old female with altered mental status and punctate right caudate infarct on brain MRI yesterday. History of left carotid occlusion. EXAM: MRA HEAD WITHOUT CONTRAST TECHNIQUE: Angiographic images of the Circle of Willis were acquired using MRA technique without intravenous contrast. COMPARISON:  Brain MRI 12/25/2021. Head and neck MRA 10/24/2017. FINDINGS: Anterior circulation: Chronically occluded left ICA with reconstituted flow  beginning at the left posterior communicating artery. Left ICA terminus is patent but irregular. Left MCA origin is patent and the left MCA bifurcates early. Visible left MCA branches are within normal limits. Stable antegrade flow in the right ICA siphon since 2019. Mild siphon irregularity but no significant stenosis. Patent right ICA terminus, right MCA and ACA origins. Left ACA A1 segment is chronically diminutive or occluded. Anterior communicating artery is normal. A2 segments appear patent and relatively symmetric although with multifocal mild bilateral ACA stenosis. Right MCA M1, bifurcation and visible right MCA branches are within normal limits. Posterior circulation: Stable antegrade flow in the posterior circulation since 2019. Codominant distal vertebral arteries with no stenosis. Patent vertebrobasilar junction and basilar artery with only mild irregularity. Patent left PICA and bilateral AICA origins. Patent SCA and PCA origins. Patent left posterior communicating artery. Mild chronic irregularity in the left P1 segment. Otherwise bilateral PCA branches are within normal limits. Anatomic variants: Left ACA might be non dominant and/or absent. Other: No intracranial mass effect or ventriculomegaly. IMPRESSION: 1. Stable chronic occlusion of the Left ICA with Left MCA reconstituted via the left posterior communicating artery. Left ACA A1 is chronically occluded or congenitally absent, and the left A2 segment is supplied via the anterior communicating artery. 2. Right ICA siphon remains patent  without stenosis. No Vertebrobasilar stenosis. 3. Mild atherosclerotic stenosis suspected in the left PCA P1 segment, bilateral ACA A2 segments. Electronically Signed   By: Genevie Ann M.D.   On: 12/26/2021 07:07  ? ?MR BRAIN WO CONTRAST ? ?Result Date: 12/25/2021 ?CLINICAL DATA:  Altered mental status, stroke suspected EXAM: MRI HEAD WITHOUT CONTRAST TECHNIQUE: Multiplanar, multiecho pulse sequences of the brain and  surrounding structures were obtained without intravenous contrast. COMPARISON:  11/04/2018 MRI, correlation is made with CT head 12/25/2021 FINDINGS: Brain: Punctate focus of restricted diffusion in the

## 2021-12-26 NOTE — Assessment & Plan Note (Addendum)
-   MRI brain revealing for punctate acute infarct of the superior caudate/corona radiata ?-Continue work-up with MRA head and neck ?-Obtain carotid ultrasound ?-Obtain echocardiogram  ?- continue daily aspirin. Appears she may also have been on Plavix but needs to have med rec verified. ?-Obtain A1c and lipids ?-PT/OT/SLT ?-Frequent neuro checks and keep on telemetry ?-Allow for permissive hypertension with blood pressure treatment as needed only if systolic goes above 102 ?

## 2021-12-26 NOTE — Evaluation (Signed)
Physical Therapy Evaluation Patient Details Name: [name] MRN: [id number] DOB: 1946-10-02 Today's Date: 12/26/2021  History of Present Illness  [name] is a 76 y.o. female with medical history significant of seizures, CVA, type 2 diabetes, asthma, COPD, PAD, CKD stage IV, hypertension and history of DVT who presents with concerns of altered mental status. MRI revealed acute puncatate infact of superior R caudate/coronoa radiata, chronic L ICA occlusion.   Clinical Impression  Pt admitted with above. Pt confused and not oriented to date or situation. Pt perseverating on calling her MD despite not knowing who her MD is. Pt very unsteady with increased falls risk requiring min/modA and use of RW for OOB mobility. Per pt she was indep and driving PTA. Pt lives alone with no support. Pt to benefit from SNF Upon d/c to allow for progression to safe mod I level of function for safe transition home. Acute PT to follow.       Recommendations for follow up therapy are one component of a multi-disciplinary discharge planning process, led by the attending physician.  Recommendations may be updated based on patient status, additional functional criteria and insurance authorization.  Follow Up Recommendations Skilled nursing-short term rehab (<3 hours/day)    Assistance Recommended at Discharge Frequent or constant Supervision/Assistance  Patient can return home with the following  A little help with walking and/or transfers;A little help with bathing/dressing/bathroom;Assistance with cooking/housework;Assistance with feeding;Direct supervision/assist for medications management;Direct supervision/assist for financial management;Assist for transportation;Help with stairs or ramp for entrance    Equipment Recommendations Rolling walker (2 wheels)  Recommendations for Other Services       Functional Status Assessment Patient has had a recent decline in their functional status and demonstrates  the ability to make significant improvements in function in a reasonable and predictable amount of time.     Precautions / Restrictions Precautions Precautions: Fall Precaution Comments: confusion Restrictions Weight Bearing Restrictions: No      Mobility  Bed Mobility Overal bed mobility: Needs Assistance Bed Mobility: Supine to Sit     Supine to sit: Min assist     General bed mobility comments: HOB elevated, definite use of railing, minA for trunk elevation    Transfers Overall transfer level: Needs assistance Equipment used: None Transfers: Sit to/from Stand Sit to Stand: Min assist           General transfer comment: increased time, minA to power up, unsteady/shaky, reaching for something to hold onto    Ambulation/Gait Ambulation/Gait assistance: Min assist, Mod assist Gait Distance (Feet): 10 Feet (x1 to bathroom via HHA, 34' with RW) Assistive device: 1 person hand held assist, Rolling walker (2 wheels) Gait Pattern/deviations: Step-through pattern, Decreased stride length, Wide base of support Gait velocity: slow Gait velocity interpretation: <1.8 ft/sec, indicate of risk for recurrent falls   General Gait Details: attempted to amb with HHA however pt very unsteady, lateral sway and reaching for something to hold onto with L UE. Pt given RW and more steady however poor walker management requiring min/modA for walker management, verbal cues to stay in the walker not push to far out  Stairs            Wheelchair Mobility    Modified Rankin (Stroke Patients Only) Modified Rankin (Stroke Patients Only) Pre-Morbid Rankin Score: Slight disability Modified Rankin: Moderately severe disability     Balance Overall balance assessment: Needs assistance Sitting-balance support: Feet supported, No upper extremity supported Sitting balance-Leahy Scale: Fair  Standing balance support: Bilateral upper extremity supported, No upper extremity supported,  During functional activity Standing balance-Leahy Scale: Poor Standing balance comment: pt leaned up on sink when washing hands, needs RW for safe amb                             Pertinent Vitals/Pain Pain Assessment Pain Assessment: No/denies pain    Home Living Family/patient expects to be discharged to:: Private residence Living Arrangements: Alone Available Help at Discharge: Family;Available PRN/intermittently Type of Home: House Home Access: Stairs to enter Entrance Stairs-Rails: Can reach both Entrance Stairs-Number of Steps: 2   Home Layout: One level Home Equipment:  (pt unsure what she has)      Prior Function Prior Level of Function : Independent/Modified Independent;Driving             Mobility Comments: pt reports being indep without AD, driving, grocery shopping ADLs Comments: indep     Hand Dominance   Dominant Hand: Right    Extremity/Trunk Assessment   Upper Extremity Assessment Upper Extremity Assessment: Generalized weakness    Lower Extremity Assessment Lower Extremity Assessment: Generalized weakness    Cervical / Trunk Assessment Cervical / Trunk Assessment: Kyphotic  Communication   Communication: No difficulties  Cognition Arousal/Alertness: Awake/alert Behavior During Therapy: WFL for tasks assessed/performed Overall Cognitive Status: Impaired/Different from baseline Area of Impairment: Orientation, Attention, Memory, Following commands, Safety/judgement, Awareness, Problem solving                 Orientation Level: Disoriented to, Time, Situation (didn't know month, stated 1966 for year) Current Attention Level: Focused (easily distracted, perseverating on "I need my  RN to tell her to call my doctor because he said if I had any problems to call him not come to the hospital") Memory: Decreased short-term memory Following Commands: Follows one step commands with increased time, Follows one step commands  inconsistently Safety/Judgement: Decreased awareness of deficits, Decreased awareness of safety Awareness: Emergent (requested to use bathroom) Problem Solving: Slow processing General Comments: pt slow processing, perseverating on calling her MD, no recall of date despite re-orientation        General Comments General comments (skin integrity, edema, etc.): BP elevated per RN at 180/83, confusion, assisted to bathroom, supervision for hygiene s/p BM, pt needing to hold onto hand rail to steady self    Exercises     Assessment/Plan    PT Assessment Patient needs continued PT services  PT Problem List Decreased strength;Decreased activity tolerance;Decreased balance;Decreased mobility;Decreased coordination;Decreased cognition;Decreased knowledge of use of DME;Decreased safety awareness       PT Treatment Interventions DME instruction;Gait training;Stair training;Functional mobility training;Therapeutic activities;Therapeutic exercise;Balance training;Neuromuscular re-education;Cognitive remediation    PT Goals (Current goals can be found in the Care Plan section)  Acute Rehab PT Goals PT Goal Formulation: With patient Time For Goal Achievement: 01/09/22 Potential to Achieve Goals: Good    Frequency Min 4X/week     Co-evaluation               AM-PAC PT "6 Clicks" Mobility  Outcome Measure Help needed turning from your back to your side while in a flat bed without using bedrails?: A Little Help needed moving from lying on your back to sitting on the side of a flat bed without using bedrails?: A Little Help needed moving to and from a bed to a chair (including a wheelchair)?: A Little Help needed standing up from a chair  using your arms (e.g., wheelchair or bedside chair)?: A Lot Help needed to walk in hospital room?: A Lot Help needed climbing 3-5 steps with a railing? : A Lot 6 Click Score: 15    End of Session Equipment Utilized During Treatment: Gait  belt Activity Tolerance: Patient tolerated treatment well Patient left: in chair;with call bell/phone within reach;with chair alarm set Nurse Communication: Mobility status PT Visit Diagnosis: Unsteadiness on feet (R26.81);Muscle weakness (generalized) (M62.81);Difficulty in walking, not elsewhere classified (R26.2)    Time: 1610-9604 PT Time Calculation (min) (ACUTE ONLY): 17 min   Charges:   PT Evaluation $PT Eval Moderate Complexity: 1 Mod          Lewis Shock, PT, DPT Acute Rehabilitation Services Pager #: [phone] Office #: [phone]   Iona Hansen 12/26/2021, 11:19 AM

## 2021-12-26 NOTE — Assessment & Plan Note (Signed)
-   Patient unable to verify any symptoms but UA shows small leukocyte, negative nitrite.  Urine culture pending she is already covered with IV Rocephin for pneumonia. ?

## 2021-12-26 NOTE — Telephone Encounter (Signed)
Pt's daughter in law [name] called to let us know pt is in hospital. I have returned her call and she did not answer.  ?

## 2021-12-26 NOTE — Progress Notes (Signed)
EEG complete - results pending 

## 2021-12-27 DIAGNOSIS — I6522 Occlusion and stenosis of left carotid artery: Secondary | ICD-10-CM

## 2021-12-27 DIAGNOSIS — R4182 Altered mental status, unspecified: Secondary | ICD-10-CM | POA: Diagnosis not present

## 2021-12-27 DIAGNOSIS — N170 Acute kidney failure with tubular necrosis: Secondary | ICD-10-CM

## 2021-12-27 DIAGNOSIS — I6521 Occlusion and stenosis of right carotid artery: Secondary | ICD-10-CM

## 2021-12-27 DIAGNOSIS — N390 Urinary tract infection, site not specified: Secondary | ICD-10-CM

## 2021-12-27 DIAGNOSIS — I63231 Cerebral infarction due to unspecified occlusion or stenosis of right carotid arteries: Secondary | ICD-10-CM | POA: Diagnosis not present

## 2021-12-27 DIAGNOSIS — R569 Unspecified convulsions: Secondary | ICD-10-CM | POA: Diagnosis not present

## 2021-12-27 DIAGNOSIS — J690 Pneumonitis due to inhalation of food and vomit: Secondary | ICD-10-CM | POA: Diagnosis not present

## 2021-12-27 LAB — CBC WITH DIFFERENTIAL/PLATELET
Abs Immature Granulocytes: 0.04 10*3/uL (ref 0.00–0.07)
Basophils Absolute: 0 10*3/uL (ref 0.0–0.1)
Basophils Relative: 0 %
Eosinophils Absolute: 0.4 10*3/uL (ref 0.0–0.5)
Eosinophils Relative: 4 %
HCT: 33.2 % — ABNORMAL LOW (ref 36.0–46.0)
Hemoglobin: 10.8 g/dL — ABNORMAL LOW (ref 12.0–15.0)
Immature Granulocytes: 0 %
Lymphocytes Relative: 16 %
Lymphs Abs: 1.5 10*3/uL (ref 0.7–4.0)
MCH: 30.9 pg (ref 26.0–34.0)
MCHC: 32.5 g/dL (ref 30.0–36.0)
MCV: 94.9 fL (ref 80.0–100.0)
Monocytes Absolute: 0.6 10*3/uL (ref 0.1–1.0)
Monocytes Relative: 6 %
Neutro Abs: 7.1 10*3/uL (ref 1.7–7.7)
Neutrophils Relative %: 74 %
Platelets: 207 10*3/uL (ref 150–400)
RBC: 3.5 MIL/uL — ABNORMAL LOW (ref 3.87–5.11)
RDW: 13.4 % (ref 11.5–15.5)
WBC: 9.7 10*3/uL (ref 4.0–10.5)
nRBC: 0 % (ref 0.0–0.2)

## 2021-12-27 LAB — BASIC METABOLIC PANEL
Anion gap: 10 (ref 5–15)
BUN: 17 mg/dL (ref 8–23)
CO2: 23 mmol/L (ref 22–32)
Calcium: 9 mg/dL (ref 8.9–10.3)
Chloride: 105 mmol/L (ref 98–111)
Creatinine, Ser: 1.41 mg/dL — ABNORMAL HIGH (ref 0.44–1.00)
GFR, Estimated: 39 mL/min — ABNORMAL LOW (ref >60)
Glucose, Bld: 93 mg/dL (ref 70–99)
Potassium: 4.2 mmol/L (ref 3.5–5.1)
Sodium: 138 mmol/L (ref 135–145)

## 2021-12-27 LAB — HEMOGLOBIN A1C
Hgb A1c MFr Bld: 5.9 % — ABNORMAL HIGH (ref 4.8–5.6)
Mean Plasma Glucose: 123 mg/dL

## 2021-12-27 LAB — MAGNESIUM: Magnesium: 2.2 mg/dL (ref 1.7–2.4)

## 2021-12-27 LAB — PHOSPHORUS: Phosphorus: 3.2 mg/dL (ref 2.5–4.6)

## 2021-12-27 MED ORDER — AMOXICILLIN-POT CLAVULANATE 875-125 MG PO TABS
1.0000 | ORAL_TABLET | Freq: Two times a day (BID) | ORAL | 0 refills | Status: AC
Start: 2021-12-27 — End: 2022-01-01

## 2021-12-27 MED ORDER — ASPIRIN 81 MG PO CHEW: 81.0000 mg | CHEWABLE_TABLET | Freq: Every day | ORAL | Status: DC

## 2021-12-27 MED ORDER — LIP MEDEX EX OINT
TOPICAL_OINTMENT | CUTANEOUS | Status: DC | PRN
  Filled 2021-12-27: qty 7

## 2021-12-27 MED ORDER — AMOXICILLIN-POT CLAVULANATE 875-125 MG PO TABS
1.0000 | ORAL_TABLET | Freq: Two times a day (BID) | ORAL | Status: DC
  Administered 2021-12-27 – 2021-12-28 (×2): 1 via ORAL
  Filled 2021-12-27 (×3): qty 1

## 2021-12-27 NOTE — Progress Notes (Signed)
Speech Language Pathology Treatment: Cognitive-Linquistic  ?Patient Details ?Name: [name] ?MRN: [id number] ?DOB: 07/08/1946 ?Today's Date: 12/27/2021 ?Time: 1000-1022 ?SLP Time Calculation (min) (ACUTE ONLY): 22 min ? ?Assessment / Plan / Recommendation ?Clinical Impression ? Pt was seen for skilled SLP services to target cognitive-communication deficits in functional manner. Session primarily focused on deficits in short-term memory loss and providing external memory aids to compensate for deficits. Pt tasked with writing today's date. Pt verbalized that she did not know the year, and thought it was 1983. Pt tasked with writing the correct date down after receiving verbal model of the date from SLP. SLP used delayed recall at the end of the session, and pt verbalized correct date. Pt benefits from visual supports/aids to compensate for short-term memory loss. SLP also educated pt on developing check-lists or calendars for future medication management. SLP to aid in development of calendar for pt/family to use for medication management. Pt verbalized that daughter-in-law, [name], manages medications. Primary concern is that the pt requires full supervision for administering medications. Pt verbalized understanding of strategies and level of support required for medication management. Pt wrote down SLP recommendations for developing check-lists/calendar and having supervision with medications. Pt instructed on writing down important information from doctors/nurses. Pt verbalized understanding of writing down important medical information in the future. SLP left paper and pen at bedside for pt to write down important medical changes/information/recommendations in the future. SLP recommends continuing to follow for generalization of strategies.  ?  ?HPI HPI: KORY PANJWANI is a 76 y.o. female with medical history significant of seizures, CVA, type 2 diabetes, asthma, COPD, PAD, CKD stage IV, hypertension and  history of DVT who presents with concerns of altered mental status. CT head was negative but MRI brain was revealing for punctate acute infarct of the superior right caudate/corona radiata.  Chronic left ICA occlusion. ?  ?   ?SLP Plan ? Continue with current plan of care ? ?  ?  ?Recommendations for follow up therapy are one component of a multi-disciplinary discharge planning process, led by the attending physician.  Recommendations may be updated based on patient status, additional functional criteria and insurance authorization. ?  ? ?Recommendations  ?   ?   ?    ?   ? ? ? ? Oral Care Recommendations: Oral care BID ?Follow Up Recommendations: Skilled nursing-short term rehab (<3 hours/day) ?Assistance recommended at discharge: Frequent or constant Supervision/Assistance ?SLP Visit Diagnosis: Cognitive communication deficit (R41.841) ?Plan: Continue with current plan of care ? ? ? ? ?  ?  ? ?Vaughan Sine, Student-SLP ? ? ?12/27/2021, 10:22 AM ?

## 2021-12-27 NOTE — Progress Notes (Addendum)
STROKE TEAM PROGRESS NOTE  ? ?INTERVAL HISTORY ?Son and husband are at bedside.  Patient reclining in bed, more awake alert than yesterday, more communicative, orientated x3.  She stated feeling better today.  Vascular surgery Dr. Virl Cagey on board, will follow up patient as outpatient to consider right carotid intervention. ? ?Vitals:  ? 12/27/21 0313 12/27/21 0748 12/27/21 1153 12/27/21 1552  ?BP: (!) 173/67 (!) 193/100 (!) 191/77 (!) 165/69  ?Pulse: 80 85 69 77  ?Resp: '17 18 18 18  '$ ?Temp: 98 ?F (36.7 ?C) 98.2 ?F (36.8 ?C) 98.1 ?F (36.7 ?C) 98.2 ?F (36.8 ?C)  ?TempSrc:  Oral Oral Oral  ?SpO2: 93% 93% 96% 95%  ?Weight:      ?Height:      ? ?CBC:  ?Recent Labs  ?Lab 12/25/21 ?1518 12/27/21 ?0351  ?WBC 18.6* 9.7  ?NEUTROABS 16.1* 7.1  ?HGB 12.6 10.8*  ?HCT 38.7 33.2*  ?MCV 94.9 94.9  ?PLT 227 207  ? ?Basic Metabolic Panel:  ?Recent Labs  ?Lab 12/25/21 ?1518 12/27/21 ?0351  ?NA 136 138  ?K 5.0 4.2  ?CL 106 105  ?CO2 20* 23  ?GLUCOSE 110* 93  ?BUN 19 17  ?CREATININE 2.04* 1.41*  ?CALCIUM 8.9 9.0  ?MG 1.1* 2.2  ?PHOS  --  3.2  ? ?Lipid Panel:  ?Recent Labs  ?Lab 12/26/21 ?0306  ?CHOL 100  ?TRIG 78  ?HDL 59  ?CHOLHDL 1.7  ?VLDL 16  ?Millard 25  ? ?HgbA1c:  ?Recent Labs  ?Lab 12/26/21 ?0306  ?HGBA1C 5.9*  ? ?Urine Drug Screen: No results for input(s): LABOPIA, COCAINSCRNUR, LABBENZ, AMPHETMU, THCU, LABBARB in the last 168 hours.  ?Alcohol Level No results for input(s): ETH in the last 168 hours. ? ?IMAGING past 24 hours ?EEG adult ? ?Result Date: 12/27/2021 ?Lora Havens, MD     12/27/2021 11:48 AM Patient Name: GRACIANNA VINK MRN: [id number] Epilepsy Attending: Lora Havens Referring Physician/Provider: Rosalin Hawking, MD Date: 12/27/2021 Duration: 27.09 mins Patient history: 76 year old female with history of seizures now with altered mental status.  EEG to evaluate for seizure Level of alertness: Awake, drowsy AEDs during EEG study: LEV Technical aspects: This EEG study was done with scalp electrodes positioned  according to the 10-20 International system of electrode placement. Electrical activity was acquired at a sampling rate of '500Hz'$  and reviewed with a high frequency filter of '70Hz'$  and a low frequency filter of '1Hz'$ . EEG data were recorded continuously and digitally stored. Description: The posterior dominant rhythm consists of 8 Hz activity of moderate voltage (25-35 uV) seen predominantly in posterior head regions, symmetric and reactive to eye opening and eye closing. Drowsiness was characterized by attenuation of the posterior background rhythm.  Hyperventilation and photic stimulation were not performed.   IMPRESSION: This study is within normal limits. No seizures or epileptiform discharges were seen throughout the recording. Priyanka Barbra Sarks   ? ?PHYSICAL EXAM ? ?Physical Exam  ?Constitutional: Appears well-developed and well-nourished.  ?Cardiovascular: Normal rate and regular rhythm.  ?Respiratory: Effort normal, non-labored breathing ? ?Neuro: ?Mental Status: ?Patient is awake, alert, oriented to person, place, and time.  ?Slgiht dysarthria at baseline  ?No signs of aphasia or neglect, follows all simple commands ?Cranial Nerves: ?II: Visual Fields are full. Pupils are equal, round, and reactive to light.   ?III,IV, VI: EOMI without ptosis or diploplia.  ?V: Facial sensation is symmetric to temperature ?VII: Slight facial droop left ?VIII: Hearing is intact to voice ?X: Palate elevates symmetrically ?XI: Shoulder shrug  is symmetric. ?XII: Tongue protrudes midline, previous trauma to tongue  ?Motor: ?Tone is normal. Bulk is normal. 5/5 strength was present BUE and RLE ?She thinks her right arm is stronger and right leg feel stronger ?Left foot dorsiflexion 4/5 ?Sensory: ?Sensation is symmetric to light touch and temperature in the arms and legs. No extinction to DSS present.  ?Deep Tendon Reflexes: ?2+ and symmetric in the biceps and patellae.  ?Plantars: ?Toes are downgoing bilaterally.  ?Cerebellar: ?FNF and  HKS are intact bilaterally ? ? ?ASSESSMENT/PLAN ?Ms. LLANA DESHAZO is a 76 y.o. female with history of seizures, CVA, type 2 diabetes, asthma, COPD, PAD, CKD stage IV, hypertension, chronic left parietal lobe stroke, known chronic L ICA occlusion and history of DVT  presenting with encephalopathy and aphasia. MRI shows a punctate acute infarct in the superior right caudate head/corona radiata.  Previous carotid Doppler shows a right ICA consistent with a 60-79% stenosis, vascular surgery consulted. ? ?Stroke: incidental finding, right punctate infarct in the superior right caudate likely small vessel disease vs. Hypoperfusion from right carotid stenosis ?Code Stroke CT head No acute abnormality. Small vessel disease.  ?MRI  Punctate acute infarct in the superior right caudate head/corona radiata. ?MRA  Chronic loss of the left ICA flow void, consistent with left ICA occlusion.  Left MCA reconstituted via the left Pcom.  Left ACA A1 is chronically occluded, the left A2 segment is supplied via Acom.  ?Carotid Doppler left ICA occlusion, right ICA 60 to 79% stenosis ?2D Echo EF 45 to 50% ?EEG normal, no seizure ?LDL 25 ?HgbA1c 5.9 ?VTE prophylaxis - Lovenox ?aspirin 81 mg daily and clopidogrel 75 mg daily prior to admission, now continue home aspirin 81 mg daily and clopidogrel 75 mg daily.  Continue on discharge ?Therapy recommendations: SNF ?Disposition:  Pending ? ?Left ICA chronic occlusion ?Right ICA stenosis ?10/2021 carotid Doppler-right ICA consistent with 60-79% stenosis ?This admission carotid Doppler same as above ?Vascular surgery consulted - will f/u with Dr. Virl Cagey in 1 month to further discuss intervention verse close observation ? ?Hypertension ?Home meds:  Norvasc '10mg'$ , lopressor '100mg'$  BID, Furosemide '20mg'$ , losartan ?Stable on the high end ?Avoid low BP ?BP monitoring education provided to son and patient ?Long-term BP goal 130-160 given left ICA chronic occlusion, right ICA high-grade  stenosis. ? ?Hyperlipidemia ?Home meds:  Atorvastatin '20mg'$   ?LDL 25, goal < 70 ?High intensity statin not indicated given low LDL ?Now decreased to Lipitor 10 ?Continue statin at discharge ? ?Other Stroke Risk Factors ?Advanced Age >/= 71  ?Coronary artery disease ?PAD following with Dr. Virl Cagey ?Obstructive sleep apnea, on CPAP at home ? ?Other Active Problems ?Seizure Disorder ? On keppra, continue on discharge ?Possible UTI and pneumonia ?UA WBC 6-10 ?Urine culture pending ?CXR right lung pneumonia ?On Rocephin and azithromycin ?Leukocytosis  ?WBC 18.6->9.7 ?AKI, creatinine 2.04->1.41 ? ?Hospital day # 2 ? ?Neurology will sign off. Please call with questions. Pt will follow up with Dr. Brett Fairy at [facility] in about 4 weeks. Thanks for the consult. ? ?Rosalin Hawking, MD PhD ?Stroke Neurology ?12/27/2021 ?5:12 PM ? ? ?

## 2021-12-27 NOTE — Progress Notes (Signed)
Physical Therapy Treatment ?Patient Details ?Name: [name] ?MRN: [id number] ?DOB: Apr 22, 1946 ?Today's Date: 12/27/2021 ? ? ?History of Present Illness [name] is a 75 y.o. female with medical history significant of seizures, CVA, type 2 diabetes, asthma, COPD, PAD, CKD stage IV, hypertension and history of DVT who presents with concerns of altered mental status. MRI revealed acute puncatate infact of superior R caudate/coronoa radiata, chronic L ICA occlusion. ? ?  ?PT Comments  ? ? Pt shows definite improvement toward goals and likely can d/c home with extra supervisory care from the family.  Emphasis on transitions, safe transfers and progression of gait without AD and safe negotiation of stairs. ?   ?Recommendations for follow up therapy are one component of a multi-disciplinary discharge planning process, led by the attending physician.  Recommendations may be updated based on patient status, additional functional criteria and insurance authorization. ? ?Follow Up Recommendations ? Home health PT ?  ?  ?Assistance Recommended at Discharge Intermittent Supervision/Assistance  ?Patient can return home with the following A little help with bathing/dressing/bathroom;Assistance with cooking/housework;Assist for transportation ?  ?Equipment Recommendations ?    ?  ?Recommendations for Other Services   ? ? ?  ?Precautions / Restrictions Precautions ?Precautions: Fall ?Precaution Comments: STM deficit not at baseline  ?  ? ?Mobility ? Bed Mobility ?Overal bed mobility: Needs Assistance ?Bed Mobility: Sit to Supine ?  ?  ?  ?Sit to supine: Supervision ?  ?General bed mobility comments: No physical assist needed ?  ? ?Transfers ?Overall transfer level: Needs assistance ?Equipment used: Rolling walker (2 wheels), None ?Transfers: Sit to/from Stand ?Sit to Stand: Supervision ?  ?  ?  ?  ?  ?  ?  ? ?Ambulation/Gait ?Ambulation/Gait assistance: Min guard, Supervision ?Gait Distance (Feet): 250 Feet ?Assistive  device: Rolling walker (2 wheels), None ?Gait Pattern/deviations: Step-through pattern ?  ?Gait velocity interpretation: 1.31 - 2.62 ft/sec, indicative of limited community ambulator (pt could speed up to cues, but not significantly) ?  ?General Gait Details: Started with RW then quickly switched to no device with pt able to mobility with only episodes of mild unsteadiness without any device, no LOB. ? ? ?Stairs ?Stairs: Yes ?Stairs assistance: Min guard ?Stair Management: One rail Left, Step to pattern, Forwards ?Number of Stairs: 3 ?General stair comments: safe with the rail, though pt was guarded. ? ? ?Wheelchair Mobility ?  ? ?Modified Rankin (Stroke Patients Only) ?Modified Rankin (Stroke Patients Only) ?Pre-Morbid Rankin Score: Slight disability ?Modified Rankin: Moderate disability ? ? ?  ?Balance Overall balance assessment: Mild deficits observed, not formally tested ?  ?Sitting balance-Leahy Scale: Fair ?  ?  ?  ?Standing balance-Leahy Scale: Fair ?  ?  ?  ?  ?  ?  ?  ?  ?  ?  ?  ?  ?  ? ?  ?Cognition Arousal/Alertness: Awake/alert ?Behavior During Therapy: [facility] for tasks assessed/performed ?Overall Cognitive Status: Impaired/Different from baseline (STM otherwise NT formally) ?  ?  ?  ?  ?  ?  ?  ?  ?  ?  ?  ?  ?  ?  ?Awareness: Emergent ?Problem Solving: Slow processing ?  ?  ?  ? ?  ?Exercises   ? ?  ?General Comments   ?  ?  ? ?Pertinent Vitals/Pain Pain Assessment ?Pain Assessment: No/denies pain  ? ? ?Home Living   ?  ?  ?  ?  ?  ?  ?  ?  ?  ?   ?  ?  Prior Function    ?  ?  ?   ? ?PT Goals (current goals can now be found in the care plan section) Acute Rehab PT Goals ?Patient Stated Goal: I'd like to go home ?PT Goal Formulation: With patient ?Time For Goal Achievement: 01/09/22 ?Potential to Achieve Goals: Good ?Progress towards PT goals: Progressing toward goals ? ?  ?Frequency ? ? ? Min 3X/week ? ? ? ?  ?PT Plan Discharge plan needs to be updated  ? ? ?Co-evaluation   ?  ?  ?  ?  ? ?  ?AM-PAC PT "6  Clicks" Mobility   ?Outcome Measure ? Help needed turning from your back to your side while in a flat bed without using bedrails?: A Little ?Help needed moving from lying on your back to sitting on the side of a flat bed without using bedrails?: A Little ?Help needed moving to and from a bed to a chair (including a wheelchair)?: A Little ?Help needed standing up from a chair using your arms (e.g., wheelchair or bedside chair)?: A Little ?Help needed to walk in hospital room?: A Little ?Help needed climbing 3-5 steps with a railing? : A Little ?6 Click Score: 18 ? ?  ?End of Session   ?Activity Tolerance: Patient tolerated treatment well ?Patient left: in bed;with call bell/phone within reach ?Nurse Communication: Mobility status ?PT Visit Diagnosis: Other abnormalities of gait and mobility (R26.89);Unsteadiness on feet (R26.81) ?  ? ? ?Time: 2774-1287 ?PT Time Calculation (min) (ACUTE ONLY): 26 min ? ?Charges:  $Gait Training: 8-22 mins ?$Therapeutic Activity: 8-22 mins          ?          ? ?12/27/2021 ? ?Ginger Carne., PT ?Acute Rehabilitation Services ?[phone]  (pager) ?[phone]  (office) ? ? ?Tessie Fass Roxsana Riding ?12/27/2021, 1:28 PM ? ?

## 2021-12-27 NOTE — Plan of Care (Signed)
  Problem: Coping: Goal: Level of anxiety will decrease Outcome: Progressing   Problem: Pain Managment: Goal: General experience of comfort will improve Outcome: Progressing   Problem: Skin Integrity: Goal: Risk for impaired skin integrity will decrease Outcome: Progressing   

## 2021-12-27 NOTE — Procedures (Signed)
Patient Name: [name]  ?MRN: [id number]  ?Epilepsy Attending: Lora Havens  ?Referring Physician/Provider: Rosalin Hawking, MD ?Date: 12/27/2021 ?Duration: 27.09 mins ? ?Patient history: 76 year old female with history of seizures now with altered mental status.  EEG to evaluate for seizure ? ?Level of alertness: Awake, drowsy ? ?AEDs during EEG study: LEV ? ?Technical aspects: This EEG study was done with scalp electrodes positioned according to the 10-20 International system of electrode placement. Electrical activity was acquired at a sampling rate of '500Hz'$  and reviewed with a high frequency filter of '70Hz'$  and a low frequency filter of '1Hz'$ . EEG data were recorded continuously and digitally stored.  ? ?Description: The posterior dominant rhythm consists of 8 Hz activity of moderate voltage (25-35 uV) seen predominantly in posterior head regions, symmetric and reactive to eye opening and eye closing. Drowsiness was characterized by attenuation of the posterior background rhythm.  Hyperventilation and photic stimulation were not performed.    ? ?IMPRESSION: ?This study is within normal limits. No seizures or epileptiform discharges were seen throughout the recording. ? ?Lora Havens  ? ?

## 2021-12-27 NOTE — Progress Notes (Signed)
?PROGRESS NOTE ? ? ? ?[name]  ZHG:[id number] DOB: July 18, 1946 DOA: 12/25/2021 ?PCP: Donnajean Lopes, MD  ? ? ?Brief Narrative:  ?76 year old chronically sick lady with multiple issues including seizure disorder, history of stroke, type 2 diabetes, asthma, COPD, peripheral artery disease, CKD stage IV with baseline creatinine about 1.6-1.7, hypertension and history of DVT presented with altered mental status.  Patient lives alone.  She tells me that she was getting ready to get out of the house as she had carpet cleaners coming to the house then she does not know what happened to her.  She woke up in the emergency room.  Patient was complaining of not feeling well for the last few days.  According to the family members, she was found unresponsive at home. ?At the emergency room, low-grade temperature, leukocytosis 18 K, acute kidney injury.  Chest x-ray with right lung infiltrate.  CT head negative but MRI was consistent with punctate acute infarct of the superior right caudate and corona radiator.  Chronic left ICA occlusion.  Admitted to hospital with aspiration pneumonia, acute stroke, suspected seizure with neurology consultations. ? ? ?Assessment & Plan: ?  ?Acute right MCA ischemic stroke: Acute metabolic encephalopathy multifactorial. ?Clinical findings, confusion.  No focal deficits. ?CT head findings on presentation with no acute intracranial findings. ?MRI of the brain on presentation with punctate acute infarcts of the right caudate head and coronary radiator.  Chronic left ICA occlusion. ?MRA of the brain, chronic occlusion of left ICA with left MCA reconstitution, right ICA patent without stenosis. ?Carotid ultrasound, totally occluded left ICA not surgically correctable. ?60 to 79% blockage right ICA, seen by vascular surgery.  Chronic and known.  They will schedule follow-up in 1 month. ?2D echocardiogram, ejection fraction 45 to 50%.  No intracardiac thrombus. ?Antiplatelet therapy, on aspirin  and Plavix at home.  Continued. ?LDL 25.  At goal.  On home dose of statin. ?Hemoglobin A1c 5.9. ?Therapy recommendations, skilled nursing facility. ?Followed by PT/OT/speech.  Neurology following. ? ?Left ICA occlusion: Chronic.  Followed by vascular surgery as outpatient.  Deemed not a surgical candidate as per patient.  They will follow-up for a right ICA. ? ?Aspiration pneumonia right lower lobe: Likely aspirated during loss of consciousness.  Currently on room air.  Clinically improving.  Normal swallowing.  Cultures negative so far. ?Received 2 days of Rocephin azithromycin, discontinue.  Will start on 5 more days of Augmentin. ? ?Seizure disorder/suspected breakthrough seizure: Likely ran out of medication but patient denies.  Loaded with Keppra and resume her home dose of Keppra.  All-time seizure precautions.  Fall precautions. ? ?Acute kidney injury on chronic kidney disease stage IV: Due to above.  ?Euvolemic on exam.  Ejection fraction 45 to 50%.  Renal functions improved to her baseline of 1.6.  ? ?Severe hypomagnesemia: Aggressively replaced and normalized.  On maintenance magnesium replacement. ? ?Essential hypertension: Elevated.  Resume all home medications.  Will resume Lasix by tomorrow. ? ?Type 2 diabetes: A1c 5.9.  No treatment needed. ? ?Chronic pain syndrome/chronic opiate use: On opiates and Flexeril at home.  Trying to avoid with mentation.  This might be polypharmacy. ? ? ? ? ?DVT prophylaxis: enoxaparin (LOVENOX) injection 30 mg Start: 12/26/21 1000 ? ? ?Code Status: Full code ?Family Communication: None ?Disposition Plan: Status is: Inpatient ?Remains inpatient appropriate because: Inpatient stroke work-up, electrolyte abnormalities.  Rehab. ?  ? ? ?Consultants:  ?Neurology ? ?Procedures:  ?None ? ?Antimicrobials:  ?Rocephin and azithromycin 3/14--- 3/16 ?Augmentin  3/16-- ? ? ?Subjective: ? ?Seen and examined.  No overnight events.  Denies any complaints.  Feels  weak. ? ?Objective: ?Vitals:  ? 12/26/21 2043 12/26/21 2342 12/27/21 0313 12/27/21 0748  ?BP: (!) 159/84 (!) 142/81 (!) 173/67 (!) 193/100  ?Pulse: 84 76 80 85  ?Resp: '17 18 17 18  '$ ?Temp: 97.9 ?F (36.6 ?C) 98.3 ?F (36.8 ?C) 98 ?F (36.7 ?C) 98.2 ?F (36.8 ?C)  ?TempSrc: Oral   Oral  ?SpO2: 95% 92% 93% 93%  ?Weight:      ?Height:      ? ? ?Intake/Output Summary (Last 24 hours) at 12/27/2021 1015 ?Last data filed at 12/27/2021 0300 ?Gross per 24 hour  ?Intake 930 ml  ?Output 0 ml  ?Net 930 ml  ? ?Filed Weights  ? 12/26/21 0104  ?Weight: 74.7 kg  ? ? ?Examination: ? ?General: Chronically sick looking.  Frail and debilitated.  Not in any distress. ?Cardiovascular: S1-S2 normal.  Regular rate rhythm. ?Respiratory: Bilateral clear.  On room air. ?Gastrointestinal: Soft nontender bowel sound present. ?Ext: No swelling or edema.  No deformities. ?Neuro: No obvious neurological deficits. ?Alert and oriented X 1-2. Not oriented to time and date.  She thinks it is 57. ?Musculoskeletal: Intact. ?Skin: Intact. ?Psych: Flat affect. ? ? ? ?Data Reviewed: I have personally reviewed following labs and imaging studies ? ?CBC: ?Recent Labs  ?Lab 12/25/21 ?1518 12/27/21 ?0351  ?WBC 18.6* 9.7  ?NEUTROABS 16.1* 7.1  ?HGB 12.6 10.8*  ?HCT 38.7 33.2*  ?MCV 94.9 94.9  ?PLT 227 207  ? ?Basic Metabolic Panel: ?Recent Labs  ?Lab 12/25/21 ?1518 12/27/21 ?0351  ?NA 136 138  ?K 5.0 4.2  ?CL 106 105  ?CO2 20* 23  ?GLUCOSE 110* 93  ?BUN 19 17  ?CREATININE 2.04* 1.41*  ?CALCIUM 8.9 9.0  ?MG 1.1* 2.2  ?PHOS  --  3.2  ? ?GFR: ?Estimated Creatinine Clearance: 33.9 mL/min (A) (by C-G formula based on SCr of 1.41 mg/dL (H)). ?Liver Function Tests: ?Recent Labs  ?Lab 12/25/21 ?1518  ?AST 28  ?ALT 16  ?ALKPHOS 87  ?BILITOT 1.0  ?PROT 6.6  ?ALBUMIN 3.5  ? ?Recent Labs  ?Lab 12/25/21 ?1518  ?LIPASE 23  ? ?Recent Labs  ?Lab 12/25/21 ?1605  ?AMMONIA 11  ? ?Coagulation Profile: ?Recent Labs  ?Lab 12/25/21 ?1518  ?INR 1.2  ? ?Cardiac Enzymes: ?No results for  input(s): CKTOTAL, CKMB, CKMBINDEX, TROPONINI in the last 168 hours. ?BNP (last 3 results) ?No results for input(s): PROBNP in the last 8760 hours. ?HbA1C: ?Recent Labs  ?  12/26/21 ?0306  ?HGBA1C 5.9*  ? ?CBG: ?Recent Labs  ?Lab 12/26/21 ?0620 12/26/21 ?1302 12/26/21 ?1618  ?GLUCAP 87 100* 159*  ? ?Lipid Profile: ?Recent Labs  ?  12/26/21 ?0306  ?CHOL 100  ?HDL 59  ?[place] 25  ?TRIG 78  ?CHOLHDL 1.7  ? ?Thyroid Function Tests: ?Recent Labs  ?  12/25/21 ?1725  ?TSH 1.434  ? ?Anemia Panel: ?No results for input(s): VITAMINB12, FOLATE, FERRITIN, TIBC, IRON, RETICCTPCT in the last 72 hours. ?Sepsis Labs: ?Recent Labs  ?Lab 12/25/21 ?1605 12/25/21 ?1725  ?LATICACIDVEN 1.7 1.6  ? ? ?Recent Results (from the past 240 hour(s))  ?Blood culture (routine x 2)     Status: None (Preliminary result)  ? Collection Time: 12/25/21  4:05 PM  ? Specimen: BLOOD  ?Result Value Ref Range Status  ? Specimen Description BLOOD LEFT ANTECUBITAL  Final  ? Special Requests   Final  ?  BOTTLES DRAWN AEROBIC  AND ANAEROBIC Blood Culture adequate volume  ? Culture   Final  ?  NO GROWTH 2 DAYS ?Performed at [facility], [place] [address] [place], [place] [address] ?  ? Report Status PENDING  Incomplete  ?Resp Panel by RT-PCR (Flu A&B, Covid) Nasopharyngeal Swab     Status: None  ? Collection Time: 12/25/21  4:05 PM  ? Specimen: Nasopharyngeal Swab; Nasopharyngeal(NP) swabs in vial transport medium  ?Result Value Ref Range Status  ? SARS Coronavirus 2 by RT PCR NEGATIVE NEGATIVE Final  ?  Comment: (NOTE) ?SARS-CoV-2 target nucleic acids are NOT DETECTED. ? ?The SARS-CoV-2 RNA is generally detectable in upper respiratory ?specimens during the acute phase of infection. The lowest ?concentration of SARS-CoV-2 viral copies this assay can detect is ?138 copies/mL. A negative result does not preclude SARS-Cov-2 ?infection and should not be used as the sole basis for treatment or ?other patient management decisions. A negative result may occur with   ?improper specimen collection/handling, submission of specimen other ?than nasopharyngeal swab, presence of viral mutation(s) within the ?areas targeted by this assay, and inadequate number of viral ?copies(<138 copies/mL). A neg

## 2021-12-27 NOTE — Progress Notes (Signed)
[facility] ? ?Subjective  - No new concerns. ? ? ?Objective ?(!) 173/67 ?80 ?98 ?F (36.7 ?C) ?17 ?93% ? ?Intake/Output Summary (Last 24 hours) at 12/27/2021 0750 ?Last data filed at 12/27/2021 0300 ?Gross per 24 hour  ?Intake 1170 ml  ?Output 0 ml  ?Net 1170 ml  ? ? ?Moving all 4 extremities ?Palpable radial pulses ?Speech garbled patient states due to previous tongue injury from prior admission.   ?No facial drop or weakness noted in extremities ?Heart RRR ? ? ? ? ?Assessment/Planning: ?Right brain infarct due to small vessel disease. 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 ?Carotid duplex showing chronically occluded left ICA stenosis and right ICA stenosis of 60-79%.  ?She will f/u with Dr. Virl Cagey in 1 month to further discuss intervention verse close observation.  Dr. Virl Cagey has sent a follow up request to our office. ?Patient agrees with this plan. ? ?Roxy Horseman ?12/27/2021 ?7:50 AM ?-- ? ?Laboratory ?Lab Results: ?Recent Labs  ?  12/25/21 ?1518 12/27/21 ?0351  ?WBC 18.6* 9.7  ?HGB 12.6 10.8*  ?HCT 38.7 33.2*  ?PLT 227 207  ? ?BMET ?Recent Labs  ?  12/25/21 ?1518 12/27/21 ?0351  ?NA 136 138  ?K 5.0 4.2  ?CL 106 105  ?CO2 20* 23  ?GLUCOSE 110* 93  ?BUN 19 17  ?CREATININE 2.04* 1.41*  ?CALCIUM 8.9 9.0  ? ? ?COAG ?Lab Results  ?Component Value Date  ? INR 1.2 12/25/2021  ? INR 1.26 11/03/2018  ? INR 1.14 11/07/2017  ? ?No results found for: PTT ? ? ? ?

## 2021-12-27 NOTE — Discharge Summary (Signed)
Physician Discharge Summary  ?[name] EPP:[id number] DOB: Sep 21, 1946 DOA: 12/25/2021 ? ?PCP: Donnajean Lopes, MD ? ?Admit date: 12/25/2021 ?Discharge date: 12/27/2021 ? ?Admitted From: Home ?Disposition: Skilled nursing facility ? ?Recommendations for Outpatient Follow-up:  ?Follow up with PCP in 1-2 weeks ?Please obtain BMP/CBC in one week ?We will send referral to neurology for follow-up ? ?Home Health: N/A ?Equipment/Devices: N/A ? ?Discharge Condition: Stable ?CODE STATUS: Full code ?Diet recommendation: Low-salt diet ? ?Discharge summary: ? ?Brief Narrative:  ?76 year old chronically sick lady with multiple issues including seizure disorder, history of stroke, type 2 diabetes, asthma, COPD, peripheral artery disease, CKD stage IV with baseline creatinine about 1.6-1.7, hypertension and history of DVT presented with altered mental status.  Patient lives alone.  She tells me that she was getting ready to get out of the house as she had carpet cleaners coming to the house then she does not know what happened to her.  She woke up in the emergency room.  Patient was complaining of not feeling well for the last few days.  According to the family members, she was found unresponsive at home. ?At the emergency room, low-grade temperature, leukocytosis 18 K, acute kidney injury.  Chest x-ray with right lung infiltrate.  CT head negative but MRI was consistent with punctate acute infarct of the superior right caudate and corona radiator.  Chronic left ICA occlusion.  Admitted to hospital with aspiration pneumonia, acute stroke, suspected seizure with neurology consultations. ?  ?  ?Assessment & Plan: ?  ?Acute right MCA ischemic stroke: Acute metabolic encephalopathy multifactorial. ?Clinical findings, confusion.  No focal deficits. ?CT head findings on presentation with no acute intracranial findings. ?MRI of the brain on presentation with punctate acute infarcts of the right caudate head and coronary radiator.   Chronic left ICA occlusion. ?MRA of the brain, chronic occlusion of left ICA with left MCA reconstitution, right ICA patent without stenosis. ?Carotid ultrasound, totally occluded left ICA not surgically correctable. ?60 to 79% blockage right ICA, seen by vascular surgery.  Chronic and known.  They will schedule follow-up in 1 month. ?2D echocardiogram, ejection fraction 45 to 50%.  No intracardiac thrombus. ?Antiplatelet therapy, on aspirin and Plavix at home.  Continued. ?LDL 25.  At goal.  On home dose of statin. ?Hemoglobin A1c 5.9. ?Therapy recommendations, skilled nursing facility. ?Followed by PT/OT/speech.  Neurology following. ?  ?Left ICA occlusion: Chronic.  Followed by vascular surgery as outpatient.  Deemed not a surgical candidate as per patient.  They will follow-up for a right ICA. ?  ?Aspiration pneumonia right lower lobe: Likely aspirated during loss of consciousness.  Currently on room air.  Clinically improving.  Normal swallowing.  Cultures negative so far. Received 2 days of Rocephin azithromycin, discontinue.  Will start on 5 more days of Augmentin. ?  ?Seizure disorder/suspected breakthrough seizure: Likely ran out of medication but patient denies.  Loaded with Keppra and resume her home dose of Keppra.  All-time seizure precautions.  Fall precautions. ?  ?Acute kidney injury on chronic kidney disease stage IV: Due to above.  ?Euvolemic on exam.  Ejection fraction 45 to 50%.  Renal functions improved to her baseline of 1.6.  ?  ?Severe hypomagnesemia: Aggressively replaced and normalized.  On maintenance magnesium replacement. ?  ?Essential hypertension: Elevated.  Resume all home medications.  Will resume Lasix by tomorrow. ?  ?Type 2 diabetes: A1c 5.9.  No treatment needed. ?  ?Chronic pain syndrome/chronic opiate use: On opiates and Flexeril at home.  Contributing to polypharmacy.  Mental status has improved.  Avoid narcotics. ? ?Patient clinically stabilized.  Stable to transfer to  skilled level of care. ? ?Discharge Diagnoses:  ?Principal Problem: ?  Stroke [facility]) ?Active Problems: ?  Seizure ([place]) ?  Sepsis ([place]) ?  UTI (urinary tract infection) ?  Aspiration pneumonia ([place]) ?  Acute on chronic systolic CHF (congestive heart failure) ([place]) ?  Acute renal failure superimposed on stage 4 chronic kidney disease ([place]) ? ? ? ?Discharge Instructions ? ?Discharge Instructions   ? ? Ambulatory referral to Neurology   Complete by: As directed ?  ? An appointment is requested in approximately: 4 weeks  ? Diet - low sodium heart healthy   Complete by: As directed ?  ? Increase activity slowly   Complete by: As directed ?  ? ?  ? ?Allergies as of 12/27/2021   ? ?   Reactions  ? Adhesive [tape] Other (See Comments)  ? "Took off my skin"  ? Other Rash  ? "Took off my skin"  ? Ace Inhibitors Other (See Comments)  ? Other reaction(s): Unknown  ? Sulfa Antibiotics   ? Other reaction(s): Unknown  ? Topiramate Other (See Comments)  ? Codeine Other (See Comments)  ? Altered mental state   ? Iodinated Contrast Media Rash  ? ?  ? ?  ?Medication List  ?  ? ?STOP taking these medications   ? ?ARIPiprazole 5 MG tablet ?Commonly known as: ABILIFY ?  ?cholestyramine 4 g packet ?Commonly known as: Questran ?  ?cyclobenzaprine 10 MG tablet ?Commonly known as: FLEXERIL ?  ?ferrous sulfate 220 (44 Fe) MG/5ML solution ?  ?HYDROcodone-acetaminophen 10-325 MG tablet ?Commonly known as: NORCO ?  ?metoprolol tartrate 100 MG tablet ?Commonly known as: LOPRESSOR ?  ?Sodium Fluoride 5000 PPM 1.1 % Pste ?Generic drug: Sodium Fluoride ?  ? ?  ? ?TAKE these medications   ? ?acetaminophen 325 MG tablet ?Commonly known as: TYLENOL ?Take 650 mg by mouth every 6 (six) hours as needed for mild pain. ?  ?albuterol 108 (90 Base) MCG/ACT inhaler ?Commonly known as: VENTOLIN HFA ?Inhale 1-2 puffs into the lungs every 6 (six) hours as needed for wheezing or shortness of breath. ?  ?amLODipine 10 MG tablet ?Commonly known as: NORVASC ?Take 10 mg  by mouth daily. ?  ?amoxicillin-clavulanate 875-125 MG tablet ?Commonly known as: AUGMENTIN ?Take 1 tablet by mouth every 12 (twelve) hours for 5 days. ?  ?aspirin 81 MG chewable tablet ?Chew 1 tablet (81 mg total) by mouth daily. ?Start taking on: December 28, 2021 ?  ?atorvastatin 20 MG tablet ?Commonly known as: LIPITOR ?Take 20 mg by mouth daily. ?  ?clopidogrel 75 MG tablet ?Commonly known as: PLAVIX ?Take 75 mg by mouth daily. ?  ?[employer] Digestive Probiotic 250 MG capsule ?Generic drug: saccharomyces boulardii ?Take 500 mg by mouth daily. ?  ?fluticasone 50 MCG/ACT nasal spray ?Commonly known as: FLONASE ?Place 2 sprays into both nostrils daily as needed for allergies. ?  ?furosemide 20 MG tablet ?Commonly known as: LASIX ?Take 20 mg by mouth daily. ?  ?levETIRAcetam 500 MG tablet ?Commonly known as: KEPPRA ?TAKE 1 TABLET BY MOUTH TWICE A DAY ?  ?loperamide 2 MG capsule ?Commonly known as: IMODIUM ?Take 2 mg by mouth daily as needed for diarrhea or loose stools. ?  ?losartan 100 MG tablet ?Commonly known as: COZAAR ?TAKE 1/2 A TABLET BY MOUTH ONCE DAILY ?What changed: See the new instructions. ?  ?magnesium oxide 400  MG tablet ?Commonly known as: MAG-OX ?Take 1 tablet by mouth daily. ?  ?metoprolol succinate 100 MG 24 hr tablet ?Commonly known as: TOPROL-XL ?Take 100 mg by mouth daily. ?What changed: Another medication with the same name was removed. Continue taking this medication, and follow the directions you see here. ?  ?mirtazapine 15 MG tablet ?Commonly known as: REMERON ?Take 15 mg by mouth at bedtime. ?  ?montelukast 10 MG tablet ?Commonly known as: SINGULAIR ?Take 10 mg by mouth daily. ?  ?nitroGLYCERIN 0.4 MG SL tablet ?Commonly known as: NITROSTAT ?Place 1 tablet (0.4 mg total) under the tongue every 5 (five) minutes as needed for chest pain. ?  ?ondansetron 4 MG tablet ?Commonly known as: ZOFRAN ?Take 1 tablet (4 mg total) by mouth every 6 (six) hours as needed for nausea or vomiting. ?  ?pantoprazole  40 MG tablet ?Commonly known as: PROTONIX ?Take 1 tablet (40 mg total) by mouth 2 (two) times daily. ?  ?Polysaccharide-Iron Complex 150 MG Caps ?Take 1 tablet by mouth daily. ?  ?venlafaxine XR 75 MG 24 hr ca

## 2021-12-28 DIAGNOSIS — J449 Chronic obstructive pulmonary disease, unspecified: Secondary | ICD-10-CM | POA: Diagnosis not present

## 2021-12-28 DIAGNOSIS — I6330 Cerebral infarction due to thrombosis of unspecified cerebral artery: Secondary | ICD-10-CM | POA: Diagnosis not present

## 2021-12-28 DIAGNOSIS — R569 Unspecified convulsions: Secondary | ICD-10-CM | POA: Diagnosis not present

## 2021-12-28 DIAGNOSIS — N170 Acute kidney failure with tubular necrosis: Secondary | ICD-10-CM | POA: Diagnosis not present

## 2021-12-28 DIAGNOSIS — N1832 Chronic kidney disease, stage 3b: Secondary | ICD-10-CM | POA: Diagnosis not present

## 2021-12-28 DIAGNOSIS — M6259 Muscle wasting and atrophy, not elsewhere classified, multiple sites: Secondary | ICD-10-CM | POA: Diagnosis not present

## 2021-12-28 DIAGNOSIS — N184 Chronic kidney disease, stage 4 (severe): Secondary | ICD-10-CM | POA: Diagnosis not present

## 2021-12-28 DIAGNOSIS — R2681 Unsteadiness on feet: Secondary | ICD-10-CM | POA: Diagnosis not present

## 2021-12-28 DIAGNOSIS — R4182 Altered mental status, unspecified: Secondary | ICD-10-CM | POA: Diagnosis not present

## 2021-12-28 DIAGNOSIS — I10 Essential (primary) hypertension: Secondary | ICD-10-CM | POA: Diagnosis not present

## 2021-12-28 DIAGNOSIS — R278 Other lack of coordination: Secondary | ICD-10-CM | POA: Diagnosis not present

## 2021-12-28 DIAGNOSIS — I5022 Chronic systolic (congestive) heart failure: Secondary | ICD-10-CM | POA: Diagnosis not present

## 2021-12-28 DIAGNOSIS — J45909 Unspecified asthma, uncomplicated: Secondary | ICD-10-CM | POA: Diagnosis not present

## 2021-12-28 DIAGNOSIS — B3731 Acute candidiasis of vulva and vagina: Secondary | ICD-10-CM | POA: Diagnosis not present

## 2021-12-28 DIAGNOSIS — I2510 Atherosclerotic heart disease of native coronary artery without angina pectoris: Secondary | ICD-10-CM | POA: Diagnosis not present

## 2021-12-28 DIAGNOSIS — I63512 Cerebral infarction due to unspecified occlusion or stenosis of left middle cerebral artery: Secondary | ICD-10-CM | POA: Diagnosis not present

## 2021-12-28 DIAGNOSIS — I63231 Cerebral infarction due to unspecified occlusion or stenosis of right carotid arteries: Secondary | ICD-10-CM | POA: Diagnosis not present

## 2021-12-28 DIAGNOSIS — K219 Gastro-esophageal reflux disease without esophagitis: Secondary | ICD-10-CM | POA: Diagnosis not present

## 2021-12-28 DIAGNOSIS — J690 Pneumonitis due to inhalation of food and vomit: Secondary | ICD-10-CM | POA: Diagnosis not present

## 2021-12-28 DIAGNOSIS — M6281 Muscle weakness (generalized): Secondary | ICD-10-CM | POA: Diagnosis not present

## 2021-12-28 DIAGNOSIS — F445 Conversion disorder with seizures or convulsions: Secondary | ICD-10-CM | POA: Diagnosis not present

## 2021-12-28 LAB — URINE CULTURE: Culture: >=100000 — AB

## 2021-12-28 MED ORDER — LOSARTAN POTASSIUM 100 MG PO TABS: 100.0000 mg | ORAL_TABLET | Freq: Every day | ORAL | 8 refills | Status: DC

## 2021-12-28 MED ORDER — LOSARTAN POTASSIUM 50 MG PO TABS
100.0000 mg | ORAL_TABLET | Freq: Every day | ORAL | Status: DC
  Administered 2021-12-28: 100 mg via ORAL
  Filled 2021-12-28: qty 2

## 2021-12-28 MED ORDER — LOPERAMIDE HCL 2 MG PO CAPS: 2.0000 mg | ORAL_CAPSULE | Freq: Three times a day (TID) | ORAL | Status: DC | PRN

## 2021-12-28 MED ORDER — LOPERAMIDE HCL 2 MG PO CAPS: 2.0000 mg | ORAL_CAPSULE | Freq: Three times a day (TID) | ORAL | 0 refills | Status: DC | PRN

## 2021-12-28 MED ORDER — PROMETHAZINE HCL 12.5 MG PO TABS
12.5000 mg | ORAL_TABLET | Freq: Four times a day (QID) | ORAL | Status: DC | PRN
  Administered 2021-12-28: 12.5 mg via ORAL
  Filled 2021-12-28 (×2): qty 1

## 2021-12-28 NOTE — Progress Notes (Signed)
?PROGRESS NOTE ? ? ? ?[name]  QQI:[id number] DOB: 1946/06/09 DOA: 12/25/2021 ?PCP: Donnajean Lopes, MD  ? ? ?Brief Narrative:  ? ?76 year old chronically sick lady with multiple issues including seizure disorder, history of stroke, type 2 diabetes, asthma, COPD, peripheral artery disease, CKD stage IV with baseline creatinine about 1.6-1.7, hypertension and history of DVT presented with altered mental status.  Patient lives alone.  She tells me that she was getting ready to get out of the house as she had carpet cleaners coming to the house then she does not know what happened to her.  She woke up in the emergency room.  Patient was complaining of not feeling well for the last few days.  According to the family members, she was found unresponsive at home. ?At the emergency room, low-grade temperature, leukocytosis 18 K, acute kidney injury.  Chest x-ray with right lung infiltrate.  CT head negative but MRI was consistent with punctate acute infarct of the superior right caudate and corona radiator.  Chronic left ICA occlusion.  Admitted to hospital with aspiration pneumonia, acute stroke, suspected seizure with neurology consultations. ? ?Assessment & Plan: ?  ?Acute right MCA ischemic stroke: Acute metabolic encephalopathy multifactorial. ?Clinical findings, confusion.  No focal deficits. ?CT head findings on presentation with no acute intracranial findings. ?MRI of the brain on presentation with punctate acute infarcts of the right caudate head and coronary radiator.  Chronic left ICA occlusion. ?MRA of the brain, chronic occlusion of left ICA with left MCA reconstitution, right ICA patent without stenosis. ?Carotid ultrasound, totally occluded left ICA not surgically correctable. ?60 to 79% blockage right ICA, seen by vascular surgery.  Chronic and known.  They will schedule follow-up in 1 month. ?2D echocardiogram, ejection fraction 45 to 50%.  No intracardiac thrombus. ?Antiplatelet therapy, on aspirin  and Plavix at home.  Continued. ?LDL 25.  At goal.  On home dose of statin. ?Hemoglobin A1c 5.9. ?Therapy recommendations, skilled nursing facility. ?  ?Left ICA occlusion: Chronic.  Followed by vascular surgery as outpatient.  Deemed not a surgical candidate as per patient.  They will follow-up for a right ICA as outpatient. ?  ?Aspiration pneumonia right lower lobe: Likely aspirated during loss of consciousness.  Currently on room air.  Clinically improving.  Normal swallowing.  Cultures negative so far. Received 2 days of Rocephin azithromycin, discontinue.  Will complete Augmentin therapy.  Respiratory symptoms improved. ?  ?Seizure disorder/suspected breakthrough seizure: Likely ran out of medication but patient denies.  Loaded with Keppra and resume her home dose of Keppra.  All-time seizure precautions.  Fall precautions. ?  ?Acute kidney injury on chronic kidney disease stage IV: Due to above.  ?Euvolemic on exam.  Ejection fraction 45 to 50%.  Renal functions improved to her baseline of 1.6.  ?  ?Severe hypomagnesemia: Aggressively replaced and normalized.  On maintenance magnesium replacement. ?  ?Essential hypertension: Elevated.  Resume all home medications.  Will increase dose of losartan to 100 mg.  Resume Lasix. ?  ?Type 2 diabetes: A1c 5.9.  No treatment needed. ?  ?Chronic pain syndrome/chronic opiate use: On opiates and Flexeril at home.  Contributing to polypharmacy.  Mental status has improved.  Avoid narcotics. ? ?Acute UTI present on admission: Urine culture drawn during admission was positive for more than 100,000 colonies of Klebsiella.  Sensitive to Augmentin.  Continue for 5 days. ?  ?Patient clinically stabilized.  Stable to transfer to skilled level of care.  Waiting for insurance prior  authorization. ? ? ?DVT prophylaxis: enoxaparin (LOVENOX) injection 30 mg Start: 12/26/21 1000 ? ? ?Code Status: Full code ?Family Communication: None today. ?Disposition Plan: Status is:  Inpatient ?Remains inpatient appropriate because: Waiting to transfer to skilled nursing facility ?  ? ? ?Consultants:  ?Neurology ? ?Procedures:  ?None ? ?Antimicrobials:  ?Rocephin and azithromycin, currently on Augmentin. ? ? ?Subjective: ?Patient seen and examined.  More awake now.  No overnight events.  She tells me she needs some pain medicine for her back otherwise she is feeling fine.  We discussed about using Tylenol and avoiding narcotics.  She is agreeable to talk to her primary care physician after rehab about pain medications.  We discussed about not using any opiates now and she is agreeable. ? ?Objective: ?Vitals:  ? 12/27/21 2020 12/27/21 2318 12/28/21 0309 12/28/21 0741  ?BP: (!) 174/70 (!) 170/88 (!) 168/86 (!) 196/97  ?Pulse:  76 74 78  ?Resp:  '18 16 17  '$ ?Temp:  98.3 ?F (36.8 ?C) 98.6 ?F (37 ?C) (!) 97.5 ?F (36.4 ?C)  ?TempSrc:  Oral Oral Oral  ?SpO2:  91% 94% 94%  ?Weight:      ?Height:      ? ?No intake or output data in the 24 hours ending 12/28/21 1017 ?Filed Weights  ? 12/26/21 0104  ?Weight: 74.7 kg  ? ? ?Examination: ? ?Looks comfortable.  Not in any distress.  On room air. ?No focal neurological deficit.  She has slight cognitive delay otherwise she is alert oriented x3-4. ? ? ? ?Data Reviewed: I have personally reviewed following labs and imaging studies ? ?CBC: ?Recent Labs  ?Lab 12/25/21 ?1518 12/27/21 ?0351  ?WBC 18.6* 9.7  ?NEUTROABS 16.1* 7.1  ?HGB 12.6 10.8*  ?HCT 38.7 33.2*  ?MCV 94.9 94.9  ?PLT 227 207  ? ?Basic Metabolic Panel: ?Recent Labs  ?Lab 12/25/21 ?1518 12/27/21 ?0351  ?NA 136 138  ?K 5.0 4.2  ?CL 106 105  ?CO2 20* 23  ?GLUCOSE 110* 93  ?BUN 19 17  ?CREATININE 2.04* 1.41*  ?CALCIUM 8.9 9.0  ?MG 1.1* 2.2  ?PHOS  --  3.2  ? ?GFR: ?Estimated Creatinine Clearance: 33.9 mL/min (A) (by C-G formula based on SCr of 1.41 mg/dL (H)). ?Liver Function Tests: ?Recent Labs  ?Lab 12/25/21 ?1518  ?AST 28  ?ALT 16  ?ALKPHOS 87  ?BILITOT 1.0  ?PROT 6.6  ?ALBUMIN 3.5  ? ?Recent Labs  ?Lab  12/25/21 ?1518  ?LIPASE 23  ? ?Recent Labs  ?Lab 12/25/21 ?1605  ?AMMONIA 11  ? ?Coagulation Profile: ?Recent Labs  ?Lab 12/25/21 ?1518  ?INR 1.2  ? ?Cardiac Enzymes: ?No results for input(s): CKTOTAL, CKMB, CKMBINDEX, TROPONINI in the last 168 hours. ?BNP (last 3 results) ?No results for input(s): PROBNP in the last 8760 hours. ?HbA1C: ?Recent Labs  ?  12/26/21 ?0306  ?HGBA1C 5.9*  ? ?CBG: ?Recent Labs  ?Lab 12/26/21 ?0620 12/26/21 ?1302 12/26/21 ?1618  ?GLUCAP 87 100* 159*  ? ?Lipid Profile: ?Recent Labs  ?  12/26/21 ?0306  ?CHOL 100  ?HDL 59  ?[place] 25  ?TRIG 78  ?CHOLHDL 1.7  ? ?Thyroid Function Tests: ?Recent Labs  ?  12/25/21 ?1725  ?TSH 1.434  ? ?Anemia Panel: ?No results for input(s): VITAMINB12, FOLATE, FERRITIN, TIBC, IRON, RETICCTPCT in the last 72 hours. ?Sepsis Labs: ?Recent Labs  ?Lab 12/25/21 ?1605 12/25/21 ?1725  ?LATICACIDVEN 1.7 1.6  ? ? ?Recent Results (from the past 240 hour(s))  ?Urine Culture     Status: Abnormal  ?  Collection Time: 12/25/21  3:18 PM  ? Specimen: Urine, Clean Catch  ?Result Value Ref Range Status  ? Specimen Description URINE, CLEAN CATCH  Final  ? Special Requests   Final  ?  NONE ?Performed at [facility], [place] [address], [place], [place] [address] ?  ? Culture >=100,000 COLONIES/mL KLEBSIELLA PNEUMONIAE (A)  Final  ? Report Status 12/28/2021 FINAL  Final  ? Organism ID, Bacteria KLEBSIELLA PNEUMONIAE (A)  Final  ?    Susceptibility  ? Klebsiella pneumoniae - MIC*  ?  AMPICILLIN RESISTANT Resistant   ?  CEFAZOLIN <=4 SENSITIVE Sensitive   ?  CEFEPIME <=0.12 SENSITIVE Sensitive   ?  CEFTRIAXONE <=0.25 SENSITIVE Sensitive   ?  CIPROFLOXACIN <=0.25 SENSITIVE Sensitive   ?  GENTAMICIN <=1 SENSITIVE Sensitive   ?  IMIPENEM <=0.25 SENSITIVE Sensitive   ?  NITROFURANTOIN 32 SENSITIVE Sensitive   ?  TRIMETH/SULFA <=20 SENSITIVE Sensitive   ?  AMPICILLIN/SULBACTAM <=2 SENSITIVE Sensitive   ?  PIP/TAZO <=4 SENSITIVE Sensitive   ?  * >=100,000 COLONIES/mL KLEBSIELLA PNEUMONIAE   ?Blood culture (routine x 2)     Status: None (Preliminary result)  ? Collection Time: 12/25/21  4:05 PM  ? Specimen: BLOOD  ?Result Value Ref Range Status  ? Specimen Description BLOOD LEFT ANTECUBITAL  Final  ? [place]

## 2021-12-28 NOTE — Care Management Important Message (Signed)
Important Message ? ?Patient Details  ?Name: [name] ?MRN: [id number] ?Date of Birth: 1946/02/18 ? ? ?Medicare Important Message Given:  Yes ? ? ? ? ?Liviya Santini ?12/28/2021, 2:37 PM ?

## 2021-12-28 NOTE — TOC Transition Note (Signed)
Transition of Care (TOC) - CM/SW Discharge Note ? ? ?Patient Details  ?Name: [name] ?MRN: [id number] ?Date of Birth: 1946-08-16 ? ?Transition of [facility]) CM/SW Contact:  ?Geralynn Ochs, LCSW ?Phone Number: ?12/28/2021, 2:29 PM ? ? ?Clinical Narrative:   CSW alerted by Isaias Cowman that insurance authorization has been approved. Patient can admit to SNF. CSW sent new discharge information and updated son, Elta Guadeloupe, via phone. Family will provide transport to [facility]  ? ?Nurse to call report to [phone], Room 1105A. ? ? ? ?Final next level of care: [place] ?Barriers to Discharge: Barriers Resolved ? ? ?Patient Goals and CMS Choice ?  ?CMS Medicare.gov Compare Post Acute Care list provided to:: Patient Represenative (must comment) ?Choice offered to / list presented to : Adult Children ? ?Discharge Placement ?  ?           ?Patient chooses bed at: [facility] ?Patient to be transferred to facility by: Family ?Name of family member notified: Elta Guadeloupe ?Patient and family notified of of transfer: 12/28/21 ? ?Discharge Plan and Services ?In-house Referral: Clinical Social Work ?Discharge Planning Services: CM Consult ?Post Acute Care Choice: [place]          ?  ?  ?  ?  ?  ?  ?  ?  ?  ?  ? ?Social Determinants of Health (SDOH) Interventions ?  ? ? ?Readmission Risk Interventions ?No flowsheet data found. ? ? ? ? ?

## 2021-12-28 NOTE — Discharge Summary (Signed)
Physician Discharge Summary  ?[name] QZE:[id number] DOB: 06-17-1946 DOA: 12/25/2021 ? ?PCP: Donnajean Lopes, MD ? ?Admit date: 12/25/2021 ?Discharge date: 12/28/2021 ? ?Admitted From: Home ?Disposition: Skilled nursing facility ? ?Recommendations for Outpatient Follow-up:  ?Follow up with PCP in 1-2 weeks ?Please obtain BMP/CBC in one week ?We will send referral to neurology for follow-up ? ?Home Health: N/A ?Equipment/Devices: N/A ? ?Discharge Condition: Stable ?CODE STATUS: Full code ?Diet recommendation: Low-salt diet ? ?Discharge summary: ? ?Brief Narrative:  ?76 year old chronically sick lady with multiple issues including seizure disorder, history of stroke, type 2 diabetes, asthma, COPD, peripheral artery disease, CKD stage IV with baseline creatinine about 1.6-1.7, hypertension and history of DVT presented with altered mental status.  Patient lives alone.  She tells me that she was getting ready to get out of the house as she had carpet cleaners coming to the house then she does not know what happened to her.  She woke up in the emergency room.  Patient was complaining of not feeling well for the last few days.  According to the family members, she was found unresponsive at home. ?At the emergency room, low-grade temperature, leukocytosis 18 K, acute kidney injury.  Chest x-ray with right lung infiltrate.  CT head negative but MRI was consistent with punctate acute infarct of the superior right caudate and corona radiator.  Chronic left ICA occlusion.  Admitted to hospital with aspiration pneumonia, acute stroke, suspected seizure with neurology consultations. ?  ?  ?Assessment & Plan: ?  ?Acute right MCA ischemic stroke: Acute metabolic encephalopathy multifactorial. ?Clinical findings, confusion.  No focal deficits. ?CT head findings on presentation with no acute intracranial findings. ?MRI of the brain on presentation with punctate acute infarcts of the right caudate head and coronary radiator.   Chronic left ICA occlusion. ?MRA of the brain, chronic occlusion of left ICA with left MCA reconstitution, right ICA patent without stenosis. ?Carotid ultrasound, totally occluded left ICA not surgically correctable. ?60 to 79% blockage right ICA, seen by vascular surgery.  Chronic and known.  They will schedule follow-up in 1 month. ?2D echocardiogram, ejection fraction 45 to 50%.  No intracardiac thrombus. ?Antiplatelet therapy, on aspirin and Plavix at home.  Continued. ?LDL 25.  At goal.  On home dose of statin. ?Hemoglobin A1c 5.9. ?Therapy recommendations, skilled nursing facility. ?  ?Left ICA occlusion: Chronic.  Followed by vascular surgery as outpatient.  Deemed not a surgical candidate as per patient.  They will follow-up for a right ICA. ?  ?Aspiration pneumonia right lower lobe/UTI due to Klebsiella present on admission: Likely aspirated during loss of consciousness.  Currently on room air.  Clinically improving.  Normal swallowing.  Cultures negative so far. Received 2 days of Rocephin azithromycin, discontinue.  Will start on 5 more days of Augmentin. ?  ?Seizure disorder/suspected breakthrough seizure: Likely ran out of medication but patient denies.  Loaded with Keppra and resume her home dose of Keppra.  All-time seizure precautions.  Fall precautions. ?  ?Acute kidney injury on chronic kidney disease stage IV: Due to above.  ?Euvolemic on exam.  Ejection fraction 45 to 50%.  Renal functions improved to her baseline of 1.6.  ?  ?Severe hypomagnesemia: Aggressively replaced and normalized.  On maintenance magnesium replacement. ?  ?Essential hypertension: Elevated.  Resume all home medications.  Will resume Lasix by tomorrow. ?  ?Type 2 diabetes: A1c 5.9.  No treatment needed. ?  ?Chronic pain syndrome/chronic opiate use: On opiates and Flexeril at home.  Contributing to polypharmacy.  Mental status has improved.  Avoid narcotics. ? ?Patient clinically stabilized.  Stable to transfer to skilled  level of care. ? ?Discharge Diagnoses:  ?Principal Problem: ?  Stroke [facility]) ?Active Problems: ?  Seizure ([place]) ?  Sepsis ([place]) ?  UTI (urinary tract infection) ?  Aspiration pneumonia ([place]) ?  Acute on chronic systolic CHF (congestive heart failure) ([place]) ?  Acute renal failure superimposed on stage 4 chronic kidney disease ([place]) ? ? ? ?Discharge Instructions ? ?Discharge Instructions   ? ? Ambulatory referral to Neurology   Complete by: As directed ?  ? Follow up with Dr. Brett Fairy in 4-6 weeks. Pt is Dr. Edwena Felty pt. Thanks.  ? Diet - low sodium heart healthy   Complete by: As directed ?  ? Increase activity slowly   Complete by: As directed ?  ? ?  ? ?Allergies as of 12/28/2021   ? ?   Reactions  ? Adhesive [tape] Other (See Comments)  ? "Took off my skin"  ? Other Rash  ? "Took off my skin"  ? Ace Inhibitors Other (See Comments)  ? Other reaction(s): Unknown  ? Sulfa Antibiotics   ? Other reaction(s): Unknown  ? Topiramate Other (See Comments)  ? Codeine Other (See Comments)  ? Altered mental state   ? Iodinated Contrast Media Rash  ? ?  ? ?  ?Medication List  ?  ? ?STOP taking these medications   ? ?ARIPiprazole 5 MG tablet ?Commonly known as: ABILIFY ?  ?cholestyramine 4 g packet ?Commonly known as: Questran ?  ?cyclobenzaprine 10 MG tablet ?Commonly known as: FLEXERIL ?  ?ferrous sulfate 220 (44 Fe) MG/5ML solution ?  ?HYDROcodone-acetaminophen 10-325 MG tablet ?Commonly known as: NORCO ?  ?metoprolol tartrate 100 MG tablet ?Commonly known as: LOPRESSOR ?  ?Sodium Fluoride 5000 PPM 1.1 % Pste ?Generic drug: Sodium Fluoride ?  ? ?  ? ?TAKE these medications   ? ?acetaminophen 325 MG tablet ?Commonly known as: TYLENOL ?Take 650 mg by mouth every 6 (six) hours as needed for mild pain. ?  ?albuterol 108 (90 Base) MCG/ACT inhaler ?Commonly known as: VENTOLIN HFA ?Inhale 1-2 puffs into the lungs every 6 (six) hours as needed for wheezing or shortness of breath. ?  ?amLODipine 10 MG tablet ?Commonly known as:  NORVASC ?Take 10 mg by mouth daily. ?  ?amoxicillin-clavulanate 875-125 MG tablet ?Commonly known as: AUGMENTIN ?Take 1 tablet by mouth every 12 (twelve) hours for 5 days. ?  ?aspirin 81 MG chewable tablet ?Chew 1 tablet (81 mg total) by mouth daily. ?  ?atorvastatin 20 MG tablet ?Commonly known as: LIPITOR ?Take 20 mg by mouth daily. ?  ?clopidogrel 75 MG tablet ?Commonly known as: PLAVIX ?Take 75 mg by mouth daily. ?  ?[employer] Digestive Probiotic 250 MG capsule ?Generic drug: saccharomyces boulardii ?Take 500 mg by mouth daily. ?  ?fluticasone 50 MCG/ACT nasal spray ?Commonly known as: FLONASE ?Place 2 sprays into both nostrils daily as needed for allergies. ?  ?furosemide 20 MG tablet ?Commonly known as: LASIX ?Take 20 mg by mouth daily. ?  ?levETIRAcetam 500 MG tablet ?Commonly known as: KEPPRA ?TAKE 1 TABLET BY MOUTH TWICE A DAY ?  ?loperamide 2 MG capsule ?Commonly known as: IMODIUM ?Take 2 mg by mouth daily as needed for diarrhea or loose stools. ?  ?losartan 100 MG tablet ?Commonly known as: COZAAR ?Take 1 tablet (100 mg total) by mouth daily. ?What changed: See the new instructions. ?  ?magnesium oxide  400 MG tablet ?Commonly known as: MAG-OX ?Take 1 tablet by mouth daily. ?  ?metoprolol succinate 100 MG 24 hr tablet ?Commonly known as: TOPROL-XL ?Take 100 mg by mouth daily. ?What changed: Another medication with the same name was removed. Continue taking this medication, and follow the directions you see here. ?  ?mirtazapine 15 MG tablet ?Commonly known as: REMERON ?Take 15 mg by mouth at bedtime. ?  ?montelukast 10 MG tablet ?Commonly known as: SINGULAIR ?Take 10 mg by mouth daily. ?  ?nitroGLYCERIN 0.4 MG SL tablet ?Commonly known as: NITROSTAT ?Place 1 tablet (0.4 mg total) under the tongue every 5 (five) minutes as needed for chest pain. ?  ?ondansetron 4 MG tablet ?Commonly known as: ZOFRAN ?Take 1 tablet (4 mg total) by mouth every 6 (six) hours as needed for nausea or vomiting. ?  ?pantoprazole 40 MG  tablet ?Commonly known as: PROTONIX ?Take 1 tablet (40 mg total) by mouth 2 (two) times daily. ?  ?Polysaccharide-Iron Complex 150 MG Caps ?Take 1 tablet by mouth daily. ?  ?venlafaxine XR 75 MG 24 hr capsule ?Comm

## 2021-12-28 NOTE — TOC Progression Note (Signed)
Transition of Care (TOC) - Progression Note  ? ? ?Patient Details  ?Name: [name] ?MRN: [id number] ?Date of Birth: April 27, 1946 ? ?Transition of Care (TOC) CM/SW Contact  ?Geralynn Ochs, LCSW ?Phone Number: ?12/28/2021, 2:27 PM ? ?Clinical Narrative:   CSW following for SNF placement. Patient's insurance authorization is still pending at this time. CSW spoke with M[facility] and bed is available if patient gets approval, hopeful for today. CSW met with patient and son this afternoon to discuss barrier to discharge being that insurance is still pending. CSW to follow. ? ? ? ?Expected Discharge Plan: [place] ?Barriers to Discharge: Continued Medical Work up ? ?Expected Discharge Plan and Services ?Expected Discharge Plan: [place] ?In-house Referral: Clinical Social Work ?Discharge Planning Services: CM Consult ?Post Acute Care Choice: [place] ?Living arrangements for the past 2 months: [place] ?Expected Discharge Date: 12/27/21               ?  ?  ?  ?  ?  ?  ?  ?  ?  ?  ? ? ?Social Determinants of Health (SDOH) Interventions ?  ? ?Readmission Risk Interventions ?No flowsheet data found. ? ?

## 2021-12-28 NOTE — Progress Notes (Signed)
[name] to be D/C'd Skilled nursing facility per MD order.  Discussed with the patient and all questions fully answered. ? ?VSS, Skin clean, dry and intact without evidence of skin break down, no evidence of skin tears noted. ?IV catheter discontinued intact. Site without signs and symptoms of complications. Dressing and pressure applied. ? ?An After Visit Summary was printed and given to the patient. ? ?D/c education completed with patient/family including follow up instructions, medication list, d/c activities limitations if indicated, with other d/c instructions as indicated by MD - patient able to verbalize understanding, all questions fully answered.  ? ?Patient instructed to return to ED, call 911, or call MD for any changes in condition.  ? ?Patient escorted via [place], and D/C to [facility] via private auto. ? ?Manuella Ghazi ?12/28/2021 4:57 PM  ?

## 2021-12-30 LAB — CULTURE, BLOOD (ROUTINE X 2)
Culture: NO GROWTH
Culture: NO GROWTH
Special Requests: ADEQUATE

## 2021-12-31 DIAGNOSIS — J690 Pneumonitis due to inhalation of food and vomit: Secondary | ICD-10-CM | POA: Diagnosis not present

## 2021-12-31 DIAGNOSIS — F445 Conversion disorder with seizures or convulsions: Secondary | ICD-10-CM | POA: Diagnosis not present

## 2021-12-31 DIAGNOSIS — I6330 Cerebral infarction due to thrombosis of unspecified cerebral artery: Secondary | ICD-10-CM | POA: Diagnosis not present

## 2022-01-02 DIAGNOSIS — J690 Pneumonitis due to inhalation of food and vomit: Secondary | ICD-10-CM | POA: Diagnosis not present

## 2022-01-02 DIAGNOSIS — F445 Conversion disorder with seizures or convulsions: Secondary | ICD-10-CM | POA: Diagnosis not present

## 2022-01-02 DIAGNOSIS — B3731 Acute candidiasis of vulva and vagina: Secondary | ICD-10-CM | POA: Diagnosis not present

## 2022-01-02 DIAGNOSIS — I6330 Cerebral infarction due to thrombosis of unspecified cerebral artery: Secondary | ICD-10-CM | POA: Diagnosis not present

## 2022-01-03 DIAGNOSIS — I5022 Chronic systolic (congestive) heart failure: Secondary | ICD-10-CM | POA: Diagnosis not present

## 2022-01-03 DIAGNOSIS — J45909 Unspecified asthma, uncomplicated: Secondary | ICD-10-CM | POA: Diagnosis not present

## 2022-01-03 DIAGNOSIS — J690 Pneumonitis due to inhalation of food and vomit: Secondary | ICD-10-CM | POA: Diagnosis not present

## 2022-01-03 DIAGNOSIS — J449 Chronic obstructive pulmonary disease, unspecified: Secondary | ICD-10-CM | POA: Diagnosis not present

## 2022-01-03 DIAGNOSIS — I6330 Cerebral infarction due to thrombosis of unspecified cerebral artery: Secondary | ICD-10-CM | POA: Diagnosis not present

## 2022-01-03 DIAGNOSIS — I2510 Atherosclerotic heart disease of native coronary artery without angina pectoris: Secondary | ICD-10-CM | POA: Diagnosis not present

## 2022-01-03 DIAGNOSIS — K219 Gastro-esophageal reflux disease without esophagitis: Secondary | ICD-10-CM | POA: Diagnosis not present

## 2022-01-03 DIAGNOSIS — I10 Essential (primary) hypertension: Secondary | ICD-10-CM | POA: Diagnosis not present

## 2022-01-03 DIAGNOSIS — N1832 Chronic kidney disease, stage 3b: Secondary | ICD-10-CM | POA: Diagnosis not present

## 2022-01-04 DIAGNOSIS — F445 Conversion disorder with seizures or convulsions: Secondary | ICD-10-CM | POA: Diagnosis not present

## 2022-01-04 DIAGNOSIS — J690 Pneumonitis due to inhalation of food and vomit: Secondary | ICD-10-CM | POA: Diagnosis not present

## 2022-01-04 DIAGNOSIS — B3731 Acute candidiasis of vulva and vagina: Secondary | ICD-10-CM | POA: Diagnosis not present

## 2022-01-04 DIAGNOSIS — I6330 Cerebral infarction due to thrombosis of unspecified cerebral artery: Secondary | ICD-10-CM | POA: Diagnosis not present

## 2022-01-07 DIAGNOSIS — I6330 Cerebral infarction due to thrombosis of unspecified cerebral artery: Secondary | ICD-10-CM | POA: Diagnosis not present

## 2022-01-07 DIAGNOSIS — J690 Pneumonitis due to inhalation of food and vomit: Secondary | ICD-10-CM | POA: Diagnosis not present

## 2022-01-07 DIAGNOSIS — F445 Conversion disorder with seizures or convulsions: Secondary | ICD-10-CM | POA: Diagnosis not present

## 2022-01-13 DIAGNOSIS — I2510 Atherosclerotic heart disease of native coronary artery without angina pectoris: Secondary | ICD-10-CM | POA: Diagnosis not present

## 2022-01-13 DIAGNOSIS — I5022 Chronic systolic (congestive) heart failure: Secondary | ICD-10-CM | POA: Diagnosis not present

## 2022-01-13 DIAGNOSIS — N390 Urinary tract infection, site not specified: Secondary | ICD-10-CM | POA: Diagnosis not present

## 2022-01-13 DIAGNOSIS — J690 Pneumonitis due to inhalation of food and vomit: Secondary | ICD-10-CM | POA: Diagnosis not present

## 2022-01-13 DIAGNOSIS — A400 Sepsis due to streptococcus, group A: Secondary | ICD-10-CM | POA: Diagnosis not present

## 2022-01-13 DIAGNOSIS — E1122 Type 2 diabetes mellitus with diabetic chronic kidney disease: Secondary | ICD-10-CM | POA: Diagnosis not present

## 2022-01-13 DIAGNOSIS — I130 Hypertensive heart and chronic kidney disease with heart failure and stage 1 through stage 4 chronic kidney disease, or unspecified chronic kidney disease: Secondary | ICD-10-CM | POA: Diagnosis not present

## 2022-01-13 DIAGNOSIS — N184 Chronic kidney disease, stage 4 (severe): Secondary | ICD-10-CM | POA: Diagnosis not present

## 2022-01-13 DIAGNOSIS — D631 Anemia in chronic kidney disease: Secondary | ICD-10-CM | POA: Diagnosis not present

## 2022-01-14 DIAGNOSIS — E785 Hyperlipidemia, unspecified: Secondary | ICD-10-CM | POA: Diagnosis not present

## 2022-01-14 DIAGNOSIS — G40909 Epilepsy, unspecified, not intractable, without status epilepticus: Secondary | ICD-10-CM | POA: Diagnosis not present

## 2022-01-14 DIAGNOSIS — I63511 Cerebral infarction due to unspecified occlusion or stenosis of right middle cerebral artery: Secondary | ICD-10-CM | POA: Diagnosis not present

## 2022-01-14 DIAGNOSIS — I130 Hypertensive heart and chronic kidney disease with heart failure and stage 1 through stage 4 chronic kidney disease, or unspecified chronic kidney disease: Secondary | ICD-10-CM | POA: Diagnosis not present

## 2022-01-14 DIAGNOSIS — I5040 Unspecified combined systolic (congestive) and diastolic (congestive) heart failure: Secondary | ICD-10-CM | POA: Diagnosis not present

## 2022-01-14 DIAGNOSIS — E1151 Type 2 diabetes mellitus with diabetic peripheral angiopathy without gangrene: Secondary | ICD-10-CM | POA: Diagnosis not present

## 2022-01-14 DIAGNOSIS — I10 Essential (primary) hypertension: Secondary | ICD-10-CM | POA: Diagnosis not present

## 2022-01-14 DIAGNOSIS — I6523 Occlusion and stenosis of bilateral carotid arteries: Secondary | ICD-10-CM | POA: Diagnosis not present

## 2022-01-14 DIAGNOSIS — J690 Pneumonitis due to inhalation of food and vomit: Secondary | ICD-10-CM | POA: Diagnosis not present

## 2022-01-30 ENCOUNTER — Telehealth: Payer: Self-pay | Admitting: Cardiology

## 2022-01-30 NOTE — Telephone Encounter (Signed)
Pt's Home Health Nurse calling to see if she can have a Verbal order for pt to have a weight scale in the home. Please advise ?

## 2022-01-30 NOTE — Telephone Encounter (Signed)
Agree. Go thru PCP ? ?Sanai Frick [place] MD, [facility] ? ?

## 2022-01-31 ENCOUNTER — Other Ambulatory Visit: Payer: Self-pay

## 2022-01-31 MED ORDER — PANTOPRAZOLE SODIUM 40 MG PO TBEC: 40.0000 mg | DELAYED_RELEASE_TABLET | Freq: Two times a day (BID) | ORAL | 2 refills | Status: AC

## 2022-01-31 NOTE — Telephone Encounter (Signed)
I attempted to contact home health nurse- did leave a message advising that MD is sending verbal request over to PCP.  ?Advised to call back if questions/concerns.  ?Left call back number.  ? ?

## 2022-01-31 NOTE — Progress Notes (Signed)
?Office Note  ? ?HPI: [name] is a 76 y.o. (December 05, 1945) female presenting in follow up with history of of left SFA stenting in May 2011.  She has a known left ICA occlusion, with questionable symptomatic right ICA stenosis. ? ?She was most recently seen in the hospital status post right-sided stroke.  This was believed to be small vessel versus hypoperfusion event.  At the time, she also had pneumonia with acute on chronic kidney injury and was not clinically fit for surgical discussion.  At that time, we discussed outpatient follow-up to continue discussions regarding cerebrovascular revascularization. ? ?On exam today, [name] was doing well.  Since hospital discharge, she has noted a few episodes of dizziness, blurred vision, but no symptoms typical of TIA, stroke, amaurosis. ?She is very worried she is going to have another stroke, as her father died of a stroke. ? ?The pt is  on a statin for cholesterol management.  ?The pt is  on a daily aspirin.   Other AC:  plavix ?The pt is  on medication for hypertension.   ?The pt is not diabetic.  ?Tobacco hx:  - ? ?Past Medical History:  ?Diagnosis Date  ? Allergy   ? Anemia   ? past hx per pt   ? Anxiety   ? Arthritis   ? back   ? Arthropathy, unspecified, site unspecified   ? Blind loop syndrome   ? Carotid artery disease ([place])   ? documented occlusion of left ICA  ? Cataract   ? removed both eyes   ? Clotting disorder ([place])   ? Hx Clot in leg per pt   ? Colostomy present ([place])   ? Coronary artery disease   ? a. known 60-70% mid-LAD stenosis with caths in 1999, 2002, 2006, and 2012 showing stable anatomy b. low-risk NST in 10/2015  ? Depression   ? Diabetes insipidus ([place])   ? Diabetes mellitus   ? Diverticulosis   ? Dizziness   ? chronic  ? Dyslipidemia   ? Dyspnea   ? with exertion  ? Fatty liver   ? GERD (gastroesophageal reflux disease)   ? hiatial hernia  ? Heart murmur   ? Hepatomegaly   ? Hiatal hernia   ? HTN (hypertension)   ? Hyperlipidemia   ?  Hypertension   ? Irritable bowel syndrome   ? Melanosis coli   ? Metatarsal fracture right  ? Normal nuclear stress test   ? nml 01/03/10;06/19/07  ? Other, mixed, or unspecified nondependent drug abuse, unspecified   ? Pancreatitis, chronic ([place])   ? Peripheral vascular disease ([place]) 02/20/10  ? s/p stent of left SFA  ? Seizure ([place]) 11/04/2018  ? last 1 yr 2020 per pt unsure month   ? Sleep apnea   ? no cpap   ? Stroke [facility])   ? Thyroid nodule   ? Unspecified essential hypertension   ? Vitamin B12 deficiency   ? ? ?Past Surgical History:  ?Procedure Laterality Date  ? ABDOMINAL HYSTERECTOMY    ? partial  ? AUGMENTATION MAMMAPLASTY    ? bilateral 1976 retro   ? BREAST ENHANCEMENT SURGERY    ? CARDIAC CATHETERIZATION  07/07/98;08/31/01;03/04/05  ? 60 -70% LAD  ? CATARACT EXTRACTION Bilateral   ? COLON RESECTION N/A 10/14/2017  ? Procedure: EXPLORATORY LAPAROTOMY, EXTENDED RIGHT COLECTOMY; APPLICATION OF ABDOMINAL VACUUM DRESSING;  Surgeon: Jovita Kussmaul, MD;  Location: [facility];  Service: General;  Laterality: N/A;  ? COLONOSCOPY    ?  ENDOBRONCHIAL ULTRASOUND Bilateral 02/24/2017  ? Procedure: ENDOBRONCHIAL ULTRASOUND;  Surgeon: Collene Gobble, MD;  Location: [facility];  Service: Cardiopulmonary;  Laterality: Bilateral;  ? FEMORAL ARTERY STENT    ? Left leg  ? LAPAROTOMY N/A 10/16/2017  ? Procedure: EXPLORATORY LAPAROTOMY, PARTIAL OMENTECTOMY, RESECTION ISCHEMIC ILEUM 811BJ, APPLICATION OF VAC ABDOMINAL DRESSING;  Surgeon: Armandina Gemma, MD;  Location: [facility];  Service: General;  Laterality: N/A;  ? LAPAROTOMY N/A 10/18/2017  ? Procedure: RE-EXPLORATION OF ABDOMEN, ILEOSTOMY CREATION;  Surgeon: Stark Klein, MD;  Location: [facility];  Service: General;  Laterality: N/A;  ? LUNG SURGERY  03/2017  ? Benign polyps removed  ? PATELLA REALIGNMENT Left   ? POLYPECTOMY    ? SIGMOIDOSCOPY    ? TONSILLECTOMY    ? ? ?Social History  ? ?Socioeconomic History  ? Marital status: Divorced  ?  Spouse name: Not on file  ? Number of  children: 2  ? Years of education: Not on file  ? Highest education level: Not on file  ?Occupational History  ? Occupation: retired  ?  Employer: DISABLED  ?Tobacco Use  ? Smoking status: Former  ?  Packs/day: 1.00  ?  Years: 50.00  ?  Pack years: 50.00  ?  Types: Cigarettes  ?  Quit date: 10/14/2010  ?  Years since quitting: 11.3  ? Smokeless tobacco: Never  ?Vaping Use  ? Vaping Use: Never used  ?Substance and Sexual Activity  ? Alcohol use: No  ? Drug use: No  ? Sexual activity: Not on file  ?Other Topics Concern  ? Not on file  ?Social History Narrative  ? Not on file  ? ?Social Determinants of Health  ? ?Financial Resource Strain: Not on file  ?Food Insecurity: Not on file  ?Transportation Needs: Not on file  ?Physical Activity: Not on file  ?Stress: Not on file  ?Social Connections: Not on file  ?Intimate Partner Violence: Not on file  ? ? ?Family History  ?Problem Relation Age of Onset  ? Hypertension Mother   ? Osteoarthritis Mother   ? Pulmonary embolism Mother   ? Hypertension Father   ?     aneurysm  ? Stroke Father   ? Colon cancer Paternal Grandfather   ? Stroke Other   ? Breast cancer Neg Hx   ? Colon polyps Neg Hx   ? Esophageal cancer Neg Hx   ? Rectal cancer Neg Hx   ? Stomach cancer Neg Hx   ? Pancreatic cancer Neg Hx   ? Liver disease Neg Hx   ? ? ?Current Outpatient Medications  ?Medication Sig Dispense Refill  ? acetaminophen (TYLENOL) 325 MG tablet Take 650 mg by mouth every 6 (six) hours as needed for mild pain.    ? albuterol (VENTOLIN HFA) 108 (90 Base) MCG/ACT inhaler Inhale 1-2 puffs into the lungs every 6 (six) hours as needed for wheezing or shortness of breath.    ? amLODipine (NORVASC) 10 MG tablet Take 10 mg by mouth daily.    ? aspirin 81 MG chewable tablet Chew 1 tablet (81 mg total) by mouth daily.    ? atorvastatin (LIPITOR) 20 MG tablet Take 20 mg by mouth daily.    ? clopidogrel (PLAVIX) 75 MG tablet Take 75 mg by mouth daily.    ? [facility] DIGESTIVE PROBIOTIC 250 MG capsule Take 500  mg by mouth daily.    ? fluticasone (FLONASE) 50 MCG/ACT nasal spray Place 2 sprays into both nostrils daily as  needed for allergies.    ? furosemide (LASIX) 20 MG tablet Take 20 mg by mouth daily.    ? levETIRAcetam (KEPPRA) 500 MG tablet TAKE 1 TABLET BY MOUTH TWICE A DAY (Patient taking differently: Take 500 mg by mouth 2 (two) times daily.) 180 tablet 2  ? loperamide (IMODIUM) 2 MG capsule Take 2 mg by mouth daily as needed for diarrhea or loose stools.     ? loperamide (IMODIUM) 2 MG capsule Take 1 capsule (2 mg total) by mouth 3 (three) times daily as needed for diarrhea or loose stools. 30 capsule 0  ? losartan (COZAAR) 100 MG tablet Take 1 tablet (100 mg total) by mouth daily. 45 tablet 8  ? magnesium oxide (MAG-OX) 400 MG tablet Take 1 tablet by mouth daily. (Patient not taking: Reported on 12/27/2021)    ? metoprolol succinate (TOPROL-XL) 100 MG 24 hr tablet Take 100 mg by mouth daily.    ? mirtazapine (REMERON) 15 MG tablet Take 15 mg by mouth at bedtime.    ? montelukast (SINGULAIR) 10 MG tablet Take 10 mg by mouth daily.    ? nitroGLYCERIN (NITROSTAT) 0.4 MG SL tablet Place 1 tablet (0.4 mg total) under the tongue every 5 (five) minutes as needed for chest pain. 90 tablet 3  ? ondansetron (ZOFRAN) 4 MG tablet Take 1 tablet (4 mg total) by mouth every 6 (six) hours as needed for nausea or vomiting. 40 tablet 1  ? Pancrelipase, Lip-Prot-Amyl, (ZENPEP) 40000-126000 units CPEP Take 2 capsules with meals and one capsule with snacks. (Patient taking differently: Take 1 capsule by mouth daily with breakfast.) 240 capsule 0  ? pantoprazole (PROTONIX) 40 MG tablet Take 1 tablet (40 mg total) by mouth 2 (two) times daily. 180 tablet 2  ? Polysaccharide-Iron Complex 150 MG CAPS Take 1 tablet by mouth daily.    ? venlafaxine XR (EFFEXOR-XR) 75 MG 24 hr capsule Take 1 capsule (75 mg total) by mouth daily.    ? vitamin B-12 (CYANOCOBALAMIN) 500 MCG tablet Take 500 mcg by mouth daily.    ? Vitamin D, Ergocalciferol,  (DRISDOL) 1.25 MG (50000 UT) CAPS capsule Take 50,000 Units by mouth every Wednesday.     ? ?No current facility-administered medications for this visit.  ? ? ?Allergies  ?Allergen Reactions  ? Adhesive [Tape] Other

## 2022-02-01 ENCOUNTER — Encounter: Payer: Self-pay | Admitting: Vascular Surgery

## 2022-02-01 ENCOUNTER — Ambulatory Visit (INDEPENDENT_AMBULATORY_CARE_PROVIDER_SITE_OTHER): Payer: Medicare HMO | Admitting: Vascular Surgery

## 2022-02-01 VITALS — BP 175/81 | HR 67 | Temp 97.9°F | Resp 14 | Ht 63.0 in | Wt 163.0 lb

## 2022-02-01 DIAGNOSIS — I6521 Occlusion and stenosis of right carotid artery: Secondary | ICD-10-CM

## 2022-02-07 ENCOUNTER — Telehealth: Payer: Self-pay | Admitting: Cardiology

## 2022-02-07 NOTE — Telephone Encounter (Signed)
Pharmacy calling mediation instructions. Call transferred to Triage. ?

## 2022-02-07 NOTE — Telephone Encounter (Signed)
Pharmacist Sunday Spillers from [facility] wanted to verify  losartan 100 mg (1/2 tablet daily) and if it can be changed to 50 mg tablets. You may reach [place] at [phone]. ?The last order they received was for 50 mg daily. Please clarify the dose the patient is to take. ?

## 2022-02-07 NOTE — Telephone Encounter (Signed)
Spoke with [name] at [facility] and gave her the order for Losartan 50 mg tablet daily. ?

## 2022-02-13 ENCOUNTER — Inpatient Hospital Stay: Payer: 59 | Admitting: Neurology

## 2022-02-13 ENCOUNTER — Telehealth: Payer: Self-pay | Admitting: Neurology

## 2022-02-13 NOTE — Telephone Encounter (Signed)
Pt was a no show to apt scheduled with Dr Brett Fairy today for hospital follow up.  ?

## 2022-02-14 ENCOUNTER — Encounter: Payer: Self-pay | Admitting: Neurology

## 2022-02-19 DIAGNOSIS — N390 Urinary tract infection, site not specified: Secondary | ICD-10-CM | POA: Diagnosis not present

## 2022-02-19 DIAGNOSIS — R399 Unspecified symptoms and signs involving the genitourinary system: Secondary | ICD-10-CM | POA: Diagnosis not present

## 2022-02-20 ENCOUNTER — Other Ambulatory Visit: Payer: Self-pay

## 2022-02-20 DIAGNOSIS — I6521 Occlusion and stenosis of right carotid artery: Secondary | ICD-10-CM

## 2022-02-20 MED ORDER — PREDNISONE 50 MG PO TABS: ORAL_TABLET | ORAL | 0 refills | Status: DC

## 2022-02-20 MED ORDER — DIPHENHYDRAMINE HCL 50 MG PO CAPS: ORAL_CAPSULE | ORAL | 0 refills | Status: DC

## 2022-03-01 ENCOUNTER — Ambulatory Visit: Payer: Medicare HMO | Admitting: Vascular Surgery

## 2022-03-07 DIAGNOSIS — Z5181 Encounter for therapeutic drug level monitoring: Secondary | ICD-10-CM | POA: Diagnosis not present

## 2022-03-07 DIAGNOSIS — M5416 Radiculopathy, lumbar region: Secondary | ICD-10-CM | POA: Diagnosis not present

## 2022-03-07 DIAGNOSIS — M5136 Other intervertebral disc degeneration, lumbar region: Secondary | ICD-10-CM | POA: Diagnosis not present

## 2022-03-07 DIAGNOSIS — Z79891 Long term (current) use of opiate analgesic: Secondary | ICD-10-CM | POA: Diagnosis not present

## 2022-03-07 NOTE — Progress Notes (Unsigned)
Office Note   HPI: [name] is a 76 y.o. (1946/01/20) female presenting in follow up with history of of left SFA stenting in May 2011.  She has a known left ICA occlusion, with questionable symptomatic right ICA stenosis.  She was most recently seen in the hospital status post right-sided stroke.  This was believed to be small vessel versus hypoperfusion event.  At the time, she also had pneumonia with acute on chronic kidney injury and was not clinically fit for surgical discussion.  At that time, we discussed outpatient follow-up to continue discussions regarding cerebrovascular revascularization.  On exam today, [name] was doing well.  Since hospital discharge, she has noted a few episodes of dizziness, blurred vision, but no symptoms typical of TIA, stroke, amaurosis. She is very worried she is going to have another stroke, as her father died of a stroke.  The pt is  on a statin for cholesterol management.  The pt is  on a daily aspirin.   Other AC:  plavix The pt is  on medication for hypertension.   The pt is not diabetic.  Tobacco hx:  -  Past Medical History:  Diagnosis Date   Allergy    Anemia    past hx per pt    Anxiety    Arthritis    back    Arthropathy, unspecified, site unspecified    Blind loop syndrome    Carotid artery disease ([place])    documented occlusion of left ICA   Cataract    removed both eyes    Clotting disorder ([facility])    Hx Clot in leg per pt    Colostomy present ([place])    Coronary artery disease    a. known 60-70% mid-LAD stenosis with caths in 1999, 2002, 2006, and 2012 showing stable anatomy b. 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[place])    Diabetes mellitus    Diverticulosis    Dizziness    chronic   Dyslipidemia    Dyspnea    with exertion   Fatty liver    GERD (gastroesophageal reflux disease)    hiatial hernia   Heart murmur    Hepatomegaly    Hiatal hernia    HTN (hypertension)    Hyperlipidemia     Hypertension    Irritable bowel syndrome    Melanosis coli    Metatarsal fracture right   Normal nuclear stress test    nml 01/03/10;06/19/07   Other, mixed, or unspecified nondependent drug abuse, unspecified    Pancreatitis, chronic ([place])    Peripheral vascular disease ([place]) 02/20/10   s/p stent of left SFA   Seizure ([place]) 11/04/2018   last 1 yr 2020 per pt unsure month    Sleep apnea    no cpap    Stroke ([place])    Thyroid nodule    Unspecified essential hypertension    Vitamin B12 deficiency     Past Surgical History:  Procedure Laterality Date   ABDOMINAL HYSTERECTOMY     partial   AUGMENTATION MAMMAPLASTY     bilateral 1976 retro    BREAST ENHANCEMENT SURGERY     CARDIAC CATHETERIZATION  07/07/98;08/31/01;03/04/05   60 -70% LAD   CATARACT EXTRACTION Bilateral    COLON RESECTION N/A 10/14/2017   Procedure: EXPLORATORY LAPAROTOMY, EXTENDED RIGHT COLECTOMY; APPLICATION OF ABDOMINAL VACUUM DRESSING;  Surgeon: Jovita Kussmaul, MD;  Location[facility];  Service: General;  Laterality: N/A;   COLONOSCOPY  ENDOBRONCHIAL ULTRASOUND Bilateral 02/24/2017   Procedure: ENDOBRONCHIAL ULTRASOUND;  Surgeon: Collene Gobble, MD;  [facility];  Service: Cardiopulmonary;  Laterality: Bilateral;   FEMORAL ARTERY STENT     Left leg   LAPAROTOMY N/A 10/16/2017   Procedure: EXPLORATORY LAPAROTOMY, PARTIAL OMENTECTOMY, RESECTION ISCHEMIC ILEUM 097DZ, APPLICATION OF VAC ABDOMINAL DRESSING;  Surgeon: Armandina Gemma, MD;  [facility];  Service: General;  Laterality: N/A;   LAPAROTOMY N/A 10/18/2017   Procedure: RE-EXPLORATION OF ABDOMEN, ILEOSTOMY CREATION;  Surgeon: Stark Klein, MD;  [facility];  Service: General;  Laterality: N/A;   LUNG SURGERY  03/2017   Benign polyps removed   PATELLA REALIGNMENT Left    POLYPECTOMY     SIGMOIDOSCOPY     TONSILLECTOMY      Social History   Socioeconomic History   Marital status: Divorced    Spouse name: Not on file   Number of  children: 2   Years of education: Not on file   Highest education level: Not on file  Occupational History   Occupation: retired    Fish farm manager: DISABLED  Tobacco Use   Smoking status: Former    Packs/day: 1.00    Years: 50.00    Pack years: 50.00    Types: Cigarettes    Quit date: 10/14/2010    Years since quitting: 11.4   Smokeless tobacco: Never  Vaping Use   Vaping Use: Never used  Substance and Sexual Activity   Alcohol use: No   Drug use: No   Sexual activity: Not on file  Other Topics Concern   Not on file  Social History Narrative   Not on file   Social Determinants of Health   Financial Resource Strain: Not on file  Food Insecurity: Not on file  Transportation Needs: Not on file  Physical Activity: Not on file  Stress: Not on file  Social Connections: Not on file  Intimate Partner Violence: Not on file    Family History  Problem Relation Age of Onset   Hypertension Mother    Osteoarthritis Mother    Pulmonary embolism Mother    Hypertension Father        aneurysm   Stroke Father    Colon cancer Paternal Grandfather    Stroke Other    Breast cancer Neg Hx    Colon polyps Neg Hx    Esophageal cancer Neg Hx    Rectal cancer Neg Hx    Stomach cancer Neg Hx    Pancreatic cancer Neg Hx    Liver disease Neg Hx     Current Outpatient Medications  Medication Sig Dispense Refill   acetaminophen (TYLENOL) 325 MG tablet Take 650 mg by mouth every 6 (six) hours as needed for mild pain.     albuterol (VENTOLIN HFA) 108 (90 Base) MCG/ACT inhaler Inhale 1-2 puffs into the lungs every 6 (six) hours as needed for wheezing or shortness of breath.     amLODipine (NORVASC) 10 MG tablet Take 10 mg by mouth daily.     aspirin 81 MG chewable tablet Chew 1 tablet (81 mg total) by mouth daily.     atorvastatin (LIPITOR) 20 MG tablet Take 20 mg by mouth daily.     clopidogrel (PLAVIX) 75 MG tablet Take 75 mg by mouth daily.     [facility] DIGESTIVE PROBIOTIC 250 MG capsule Take 500  mg by mouth daily.     diphenhydrAMINE (BENADRYL) 50 MG capsule Take 50 mg by mouth 1 hour prior to  your procedure. 1 capsule 0   fluticasone (FLONASE) 50 MCG/ACT nasal spray Place 2 sprays into both nostrils daily as needed for allergies.     furosemide (LASIX) 20 MG tablet Take 20 mg by mouth daily.     levETIRAcetam (KEPPRA) 500 MG tablet TAKE 1 TABLET BY MOUTH TWICE A DAY (Patient taking differently: Take 500 mg by mouth 2 (two) times daily.) 180 tablet 2   loperamide (IMODIUM) 2 MG capsule Take 2 mg by mouth daily as needed for diarrhea or loose stools.      loperamide (IMODIUM) 2 MG capsule Take 1 capsule (2 mg total) by mouth 3 (three) times daily as needed for diarrhea or loose stools. 30 capsule 0   losartan (COZAAR) 100 MG tablet Take 1 tablet (100 mg total) by mouth daily. 45 tablet 8   magnesium oxide (MAG-OX) 400 MG tablet Take 1 tablet by mouth daily.     metoprolol succinate (TOPROL-XL) 100 MG 24 hr tablet Take 100 mg by mouth daily.     mirtazapine (REMERON) 15 MG tablet Take 15 mg by mouth at bedtime.     montelukast (SINGULAIR) 10 MG tablet Take 10 mg by mouth daily.     nitroGLYCERIN (NITROSTAT) 0.4 MG SL tablet Place 1 tablet (0.4 mg total) under the tongue every 5 (five) minutes as needed for chest pain. 90 tablet 3   ondansetron (ZOFRAN) 4 MG tablet Take 1 tablet (4 mg total) by mouth every 6 (six) hours as needed for nausea or vomiting. 40 tablet 1   Pancrelipase, Lip-Prot-Amyl, (ZENPEP) 40000-126000 units CPEP Take 2 capsules with meals and one capsule with snacks. (Patient taking differently: Take 1 capsule by mouth daily with breakfast.) 240 capsule 0   pantoprazole (PROTONIX) 40 MG tablet Take 1 tablet (40 mg total) by mouth 2 (two) times daily. 180 tablet 2   Polysaccharide-Iron Complex 150 MG CAPS Take 1 tablet by mouth daily.     predniSONE (DELTASONE) 50 MG tablet One tablet ('50mg'$ ) 13 hours prior to procedure; one tablet ('50mg'$ ) 7 hours prior to procedure and then one  tablet (50 mg) one hour prior to procedure. 3 tablet 0   venlafaxine XR (EFFEXOR-XR) 75 MG 24 hr capsule Take 1 capsule (75 mg total) by mouth daily.     vitamin B-12 (CYANOCOBALAMIN) 500 MCG tablet Take 500 mcg by mouth daily.     Vitamin D, Ergocalciferol, (DRISDOL) 1.25 MG (50000 UT) CAPS capsule Take 50,000 Units by mouth every Wednesday.      No current facility-administered medications for this visit.    Allergies  Allergen Reactions   Adhesive [Tape] Other (See Comments)    "Took off my skin"   Other Rash    "Took off my skin"   Ace Inhibitors Other (See Comments)    Other reaction(s): Unknown   Sulfa Antibiotics     Other reaction(s): Unknown   Topiramate Other (See Comments)   Codeine Other (See Comments)    Altered mental state    Iodinated Contrast Media Rash     REVIEW OF SYSTEMS:   '[X]'$  denotes positive finding, '[ ]'$  denotes negative finding Cardiac  Comments:  Chest pain or chest pressure:    Shortness of breath upon exertion:    Short of breath when lying flat:    Irregular heart rhythm:        Vascular    Pain in calf, thigh, or hip brought on by ambulation:    Pain in feet at night that  wakes you up from your sleep:     Blood clot in your veins:    Leg swelling:         Pulmonary    Oxygen at home:    Productive cough:     Wheezing:         Neurologic    Sudden weakness in arms or legs:     Sudden numbness in arms or legs:     Sudden onset of difficulty speaking or slurred speech:    Temporary loss of vision in one eye:     Problems with dizziness:         Gastrointestinal    Blood in stool:     Vomited blood:         Genitourinary    Burning when urinating:     Blood in urine:        Psychiatric    Major depression:         Hematologic    Bleeding problems:    Problems with blood clotting too easily:        Skin    Rashes or ulcers:        Constitutional    Fever or chills:      PHYSICAL EXAMINATION:  There were no vitals  filed for this visit.  General:  WDWN in NAD; vital signs documented above Gait: Not observed HENT: WNL, normocephalic, slurred speech from tongue deviation due to previous ET tube Pulmonary: normal non-labored breathing , without wheezing Cardiac: regular HR, Abdomen: soft, NT, no masses Skin: without rashes Vascular Exam/Pulses:  Right Left  Radial 2+ (normal) 2+ (normal)  Ulnar 2+ (normal) 2+ (normal)  Femoral    Popliteal    DP 2+ (normal) 2+ (normal)       Extremities: without ischemic changes, without Gangrene , without cellulitis; without open wounds; leg swelling improved dramatically Musculoskeletal: no muscle wasting or atrophy  Neurologic: A&O X 3;  No focal weakness or paresthesias are detected Psychiatric:  The pt has Normal affect.   Non-Invasive Vascular Imaging:   ABI Findings:  +---------+------------------+-----+---------+--------+  Right    Rt Pressure (mmHg)IndexWaveform Comment   +---------+------------------+-----+---------+--------+  Brachial 170                                       +---------+------------------+-----+---------+--------+  PTA      146               0.86 triphasic          +---------+------------------+-----+---------+--------+  DP       139               0.82 triphasic          +---------+------------------+-----+---------+--------+  Great Toe75                0.44                    +---------+------------------+-----+---------+--------+   +---------+------------------+-----+---------+-------+  Left     Lt Pressure (mmHg)IndexWaveform Comment  +---------+------------------+-----+---------+-------+  Brachial 165                                      +---------+------------------+-----+---------+-------+  PTA      146  0.86 triphasic         +---------+------------------+-----+---------+-------+  DP       119               0.70 triphasic          +---------+------------------+-----+---------+-------+  Great Toe93                0.55                   +---------+------------------+-----+---------+-------+   Summary:  Right Carotid: Velocities in the right ICA are consistent with a 60-79%                 stenosis.  Left Carotid: Evidence consistent with a total occlusion of the left ICA.  Vertebrals: Bilateral vertebral arteries demonstrate antegrade flow.   ASSESSMENT/PLAN: [name] is a 76 y.o. female presenting in follow up with history of of left SFA stenting in May 2011.  She has a known left ICA occlusion, with questionable symptomatic right ICA stenosis.  She was last seen in the hospital status post right-sided stroke believed to be small vessel versus global hypoperfusion.  At the time she also had pneumonia and acute on chronic kidney injury.  I had a long discussion with her neurologist Dr. Erlinda Hong, who felt comfortable with outpatient follow-up due to her numerous comorbidities and in- hospital clinical status.  On presentation today, she continues to have some episodes vision changes and dizziness.  While atypical of stroke, amaurosis, TIA, I am concerned as she already has a left-sided occlusion.  Being that the etiology of her previous stroke is ill-defined, and there is a possibility this could be from global hypoperfusion in the setting of left-sided occlusion and right-sided moderate stenosis, I believe she would benefit from right-sided cerebrovascular revascularization to lower her risk of future stroke.  I have ordered a CT angio head and neck in an effort to define her lesion further. I asked that she continue her current medication regimen I will see her in 1 month, after her CT scan has been completed and we will discuss surgical options.   [name], MD [facility] [phone]

## 2022-03-08 ENCOUNTER — Ambulatory Visit: Payer: Medicare HMO | Admitting: Vascular Surgery

## 2022-03-13 ENCOUNTER — Other Ambulatory Visit: Payer: Self-pay

## 2022-03-13 DIAGNOSIS — I6521 Occlusion and stenosis of right carotid artery: Secondary | ICD-10-CM

## 2022-03-13 DIAGNOSIS — I6522 Occlusion and stenosis of left carotid artery: Secondary | ICD-10-CM

## 2022-03-13 MED ORDER — PREDNISONE 50 MG PO TABS: ORAL_TABLET | ORAL | 0 refills | Status: DC

## 2022-03-13 MED ORDER — DIPHENHYDRAMINE HCL 50 MG PO CAPS: ORAL_CAPSULE | ORAL | 0 refills | Status: DC

## 2022-03-14 ENCOUNTER — Ambulatory Visit (HOSPITAL_COMMUNITY)
Admission: RE | Admit: 2022-03-14 | Discharge: 2022-03-14 | Disposition: A | Discharge status: Home / Self Care | Payer: Medicare HMO | Source: Ambulatory Visit | Attending: Vascular Surgery | Admitting: Vascular Surgery

## 2022-03-14 ENCOUNTER — Encounter (HOSPITAL_COMMUNITY): Payer: Self-pay

## 2022-03-14 DIAGNOSIS — I672 Cerebral atherosclerosis: Secondary | ICD-10-CM | POA: Diagnosis not present

## 2022-03-14 DIAGNOSIS — I771 Stricture of artery: Secondary | ICD-10-CM | POA: Diagnosis not present

## 2022-03-14 DIAGNOSIS — I6521 Occlusion and stenosis of right carotid artery: Secondary | ICD-10-CM | POA: Diagnosis not present

## 2022-03-14 DIAGNOSIS — G459 Transient cerebral ischemic attack, unspecified: Secondary | ICD-10-CM | POA: Diagnosis not present

## 2022-03-14 LAB — POCT I-STAT CREATININE: Creatinine, Ser: 1.9 mg/dL — ABNORMAL HIGH (ref 0.44–1.00)

## 2022-03-14 MED ORDER — IOHEXOL 350 MG/ML SOLN
50.0000 mL | Freq: Once | INTRAVENOUS | Status: AC | PRN
  Administered 2022-03-14: 50 mL via INTRAVENOUS

## 2022-03-14 MED ORDER — SODIUM CHLORIDE (PF) 0.9 % IJ SOLN
INTRAMUSCULAR | Status: AC
  Filled 2022-03-14: qty 50

## 2022-03-14 MED ORDER — IOHEXOL 350 MG/ML SOLN: 75.0000 mL | Freq: Once | INTRAVENOUS | Status: DC | PRN

## 2022-03-14 NOTE — [facility] Notes (Signed)
Office Note   HPI: [name] is a 76 y.o. (03-05-1946) female presenting in follow up with history of of left SFA stenting in May 2011.  She has a known left ICA occlusion, with questionable symptomatic right ICA stenosis.  She was most recently seen in the hospital status post right-sided stroke.  This was believed to be small vessel versus hypoperfusion event.  At the time, she also had pneumonia with acute on chronic kidney injury and was not clinically fit for surgical discussion.  At that time, we discussed outpatient follow-up to continue discussions regarding cerebrovascular revascularization.  On exam today, [name] was doing well, companied by her son.  In the interim since her last visit, she underwent CTA of the head.  Since hospital discharge, she has noted a few episodes of dizziness, blurred vision, but no symptoms typical of TIA, stroke, amaurosis. She is very worried she is going to have another stroke, as her father died of a stroke.  The pt is  on a statin for cholesterol management.  The pt is  on a daily aspirin.   Other AC:  plavix The pt is  on medication for hypertension.   The pt is not diabetic.  Tobacco hx:  -  Past Medical History:  Diagnosis Date   Allergy    Anemia    past hx per pt    Anxiety    Arthritis    back    Arthropathy, unspecified, site unspecified    Blind loop syndrome    Carotid artery disease ([place])    documented occlusion of left ICA   Cataract    removed both eyes    Clotting disorder (HCC)    Hx Clot in leg per pt    Colostomy present ([place])    Coronary artery disease    a. known 60-70% mid-LAD stenosis with caths in 1999, 2002, 2006, and 2012 showing stable anatomy b. 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[facility])    Diabetes mellitus    Diverticulosis    Dizziness    chronic   Dyslipidemia    Dyspnea    with exertion   Fatty liver    GERD (gastroesophageal reflux disease)    hiatial hernia   Heart  murmur    Hepatomegaly    Hiatal hernia    HTN (hypertension)    Hyperlipidemia    Hypertension    Irritable bowel syndrome    Melanosis coli    Metatarsal fracture right   Normal nuclear stress test    nml 01/03/10;06/19/07   Other, mixed, or unspecified nondependent drug abuse, unspecified    Pancreatitis, chronic ([place])    Peripheral vascular disease ([place]) 02/20/10   s/p stent of left SFA   Seizure ([place]) 11/04/2018   last 1 yr 2020 per pt unsure month    Sleep apnea    no cpap    Stroke ([place])    Thyroid nodule    Unspecified essential hypertension    Vitamin B12 deficiency     Past Surgical History:  Procedure Laterality Date   ABDOMINAL HYSTERECTOMY     partial   AUGMENTATION MAMMAPLASTY     bilateral 1976 retro    BREAST ENHANCEMENT SURGERY     CARDIAC CATHETERIZATION  07/07/98;08/31/01;03/04/05   60 -70% LAD   CATARACT EXTRACTION Bilateral    COLON RESECTION N/A 10/14/2017   Procedure: EXPLORATORY LAPAROTOMY, EXTENDED RIGHT COLECTOMY; APPLICATION OF ABDOMINAL VACUUM DRESSING;  Surgeon: Marlou Starks,  Sena Hitch, MD;  Location: [facility];  Service: General;  Laterality: N/A;   COLONOSCOPY     ENDOBRONCHIAL ULTRASOUND Bilateral 02/24/2017   Procedure: ENDOBRONCHIAL ULTRASOUND;  Surgeon: Collene Gobble, MD;  Location: [facility];  Service: Cardiopulmonary;  Laterality: Bilateral;   FEMORAL ARTERY STENT     Left leg   LAPAROTOMY N/A 10/16/2017   Procedure: EXPLORATORY LAPAROTOMY, PARTIAL OMENTECTOMY, RESECTION ISCHEMIC ILEUM 147WG, APPLICATION OF VAC ABDOMINAL DRESSING;  Surgeon: Armandina Gemma, MD;  Location: [facility];  Service: General;  Laterality: N/A;   LAPAROTOMY N/A 10/18/2017   Procedure: RE-EXPLORATION OF ABDOMEN, ILEOSTOMY CREATION;  Surgeon: Stark Klein, MD;  Location: [facility];  Service: General;  Laterality: N/A;   LUNG SURGERY  03/2017   Benign polyps removed   PATELLA REALIGNMENT Left    POLYPECTOMY     SIGMOIDOSCOPY     TONSILLECTOMY      Social History    Socioeconomic History   Marital status: Divorced    Spouse name: Not on file   Number of children: 2   Years of education: Not on file   Highest education level: Not on file  Occupational History   Occupation: retired    Fish farm manager: DISABLED  Tobacco Use   Smoking status: Former    Packs/day: 1.00    Years: 50.00    Pack years: 50.00    Types: Cigarettes    Quit date: 10/14/2010    Years since quitting: 11.4   Smokeless tobacco: Never  Vaping Use   Vaping Use: Never used  Substance and Sexual Activity   Alcohol use: No   Drug use: No   Sexual activity: Not on file  Other Topics Concern   Not on file  Social History Narrative   Not on file   Social Determinants of Health   Financial Resource Strain: Not on file  Food Insecurity: Not on file  Transportation Needs: Not on file  Physical Activity: Not on file  Stress: Not on file  Social Connections: Not on file  Intimate Partner Violence: Not on file    Family History  Problem Relation Age of Onset   Hypertension Mother    Osteoarthritis Mother    Pulmonary embolism Mother    Hypertension Father        aneurysm   Stroke Father    Colon cancer Paternal Grandfather    Stroke Other    Breast cancer Neg Hx    Colon polyps Neg Hx    Esophageal cancer Neg Hx    Rectal cancer Neg Hx    Stomach cancer Neg Hx    Pancreatic cancer Neg Hx    Liver disease Neg Hx     Current Outpatient Medications  Medication Sig Dispense Refill   acetaminophen (TYLENOL) 325 MG tablet Take 650 mg by mouth every 6 (six) hours as needed for mild pain.     albuterol (VENTOLIN HFA) 108 (90 Base) MCG/ACT inhaler Inhale 1-2 puffs into the lungs every 6 (six) hours as needed for wheezing or shortness of breath.     amLODipine (NORVASC) 10 MG tablet Take 10 mg by mouth daily.     aspirin 81 MG chewable tablet Chew 1 tablet (81 mg total) by mouth daily.     atorvastatin (LIPITOR) 20 MG tablet Take 20 mg by mouth daily.     clopidogrel  (PLAVIX) 75 MG tablet Take 75 mg by mouth daily.     [employer] DIGESTIVE PROBIOTIC 250 MG capsule Take 500 mg by  mouth daily.     diphenhydrAMINE (BENADRYL) 50 MG capsule Take 50 mg by mouth 1 hour prior to your procedure. 1 capsule 0   fluticasone (FLONASE) 50 MCG/ACT nasal spray Place 2 sprays into both nostrils daily as needed for allergies.     furosemide (LASIX) 20 MG tablet Take 20 mg by mouth daily.     levETIRAcetam (KEPPRA) 500 MG tablet TAKE 1 TABLET BY MOUTH TWICE A DAY (Patient taking differently: Take 500 mg by mouth 2 (two) times daily.) 180 tablet 2   loperamide (IMODIUM) 2 MG capsule Take 2 mg by mouth daily as needed for diarrhea or loose stools.      loperamide (IMODIUM) 2 MG capsule Take 1 capsule (2 mg total) by mouth 3 (three) times daily as needed for diarrhea or loose stools. 30 capsule 0   losartan (COZAAR) 100 MG tablet Take 1 tablet (100 mg total) by mouth daily. 45 tablet 8   magnesium oxide (MAG-OX) 400 MG tablet Take 1 tablet by mouth daily.     metoprolol succinate (TOPROL-XL) 100 MG 24 hr tablet Take 100 mg by mouth daily.     mirtazapine (REMERON) 15 MG tablet Take 15 mg by mouth at bedtime.     montelukast (SINGULAIR) 10 MG tablet Take 10 mg by mouth daily.     nitroGLYCERIN (NITROSTAT) 0.4 MG SL tablet Place 1 tablet (0.4 mg total) under the tongue every 5 (five) minutes as needed for chest pain. 90 tablet 3   ondansetron (ZOFRAN) 4 MG tablet Take 1 tablet (4 mg total) by mouth every 6 (six) hours as needed for nausea or vomiting. 40 tablet 1   Pancrelipase, Lip-Prot-Amyl, (ZENPEP) 40000-126000 units CPEP Take 2 capsules with meals and one capsule with snacks. (Patient taking differently: Take 1 capsule by mouth daily with breakfast.) 240 capsule 0   pantoprazole (PROTONIX) 40 MG tablet Take 1 tablet (40 mg total) by mouth 2 (two) times daily. 180 tablet 2   Polysaccharide-Iron Complex 150 MG CAPS Take 1 tablet by mouth daily.     predniSONE (DELTASONE) 50 MG tablet One  tablet ('50mg'$ ) 13 hours prior to procedure; one tablet ('50mg'$ ) 7 hours prior to procedure and then one tablet (50 mg) one hour prior to procedure. 3 tablet 0   venlafaxine XR (EFFEXOR-XR) 75 MG 24 hr capsule Take 1 capsule (75 mg total) by mouth daily.     vitamin B-12 (CYANOCOBALAMIN) 500 MCG tablet Take 500 mcg by mouth daily.     Vitamin D, Ergocalciferol, (DRISDOL) 1.25 MG (50000 UT) CAPS capsule Take 50,000 Units by mouth every Wednesday.      No current facility-administered medications for this visit.   Facility-Administered Medications Ordered in Other Visits  Medication Dose Route Frequency Provider Last Rate Last Admin   sodium chloride (PF) 0.9 % injection             Allergies  Allergen Reactions   Adhesive [Tape] Other (See Comments)    "Took off my skin"   Other Rash    "Took off my skin"   Ace Inhibitors Other (See Comments)    Other reaction(s): Unknown   Sulfa Antibiotics     Other reaction(s): Unknown   Topiramate Other (See Comments)   Codeine Other (See Comments)    Altered mental state    Iodinated Contrast Media Rash     REVIEW OF SYSTEMS:   '[X]'$  denotes positive finding, '[ ]'$  denotes negative finding Cardiac  Comments:  Chest pain or  chest pressure:    Shortness of breath upon exertion:    Short of breath when lying flat:    Irregular heart rhythm:        Vascular    Pain in calf, thigh, or hip brought on by ambulation:    Pain in feet at night that wakes you up from your sleep:     Blood clot in your veins:    Leg swelling:         Pulmonary    Oxygen at home:    Productive cough:     Wheezing:         Neurologic    Sudden weakness in arms or legs:     Sudden numbness in arms or legs:     Sudden onset of difficulty speaking or slurred speech:    Temporary loss of vision in one eye:     Problems with dizziness:         Gastrointestinal    Blood in stool:     Vomited blood:         Genitourinary    Burning when urinating:     Blood in  urine:        Psychiatric    Major depression:         Hematologic    Bleeding problems:    Problems with blood clotting too easily:        Skin    Rashes or ulcers:        Constitutional    Fever or chills:      PHYSICAL EXAMINATION:  There were no vitals filed for this visit.  General:  WDWN in NAD; vital signs documented above Gait: Not observed HENT: WNL, normocephalic, slurred speech from tongue deviation due to previous ET tube Pulmonary: normal non-labored breathing , without wheezing Cardiac: regular HR, Abdomen: soft, NT, no masses Skin: without rashes Vascular Exam/Pulses:  Right Left  Radial 2+ (normal) 2+ (normal)  Ulnar 2+ (normal) 2+ (normal)  Femoral    Popliteal    DP 2+ (normal) 2+ (normal)       Extremities: without ischemic changes, without Gangrene , without cellulitis; without open wounds; leg swelling improved dramatically Musculoskeletal: no muscle wasting or atrophy  Neurologic: A&O X 3;  No focal weakness or paresthesias are detected Psychiatric:  The pt has Normal affect.   Non-Invasive Vascular Imaging:    Summary:  Right Carotid: Velocities in the right ICA are consistent with a 60-79%                 stenosis.  Left Carotid: Evidence consistent with a total occlusion of the left ICA.  Vertebrals: Bilateral vertebral arteries demonstrate antegrade flow.   Right carotid system: Atherosclerosis at the carotid bifurcation with approximately 70-80% stenosis of the proximal ICA.  ASSESSMENT/PLAN: GALILEE PIERRON is a 76 y.o. female presenting in follow up with history of of left SFA stenting in May 2011.  She has a known left ICA occlusion, with questionable symptomatic right ICA stenosis.  Imaging demonstrates stenosis greater than 60%.  Being that there is concern for symptomatic disease, the patient has an occluded contralateral internal carotid artery, [name] and I discussed revascularization.  She is very concerned about stroke and  would like to proceed in an effort to reduce her risk.  [name] and I discussed transcarotid artery revascularization, and the risks and benefits associated with this procedure.  We also discussed carotid endarterectomy.  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  I think TCAR has a lower risk profile.  After discussing the risk and benefits, she elected to proceed.  I asked that she continue her current medication regimen. I will work to schedule.   [name], MD [facility] [phone]

## 2022-03-15 ENCOUNTER — Encounter: Payer: Self-pay | Admitting: Vascular Surgery

## 2022-03-15 ENCOUNTER — Ambulatory Visit (INDEPENDENT_AMBULATORY_CARE_PROVIDER_SITE_OTHER): Payer: Medicare HMO | Admitting: Vascular Surgery

## 2022-03-15 VITALS — BP 186/144 | HR 79 | Temp 97.9°F | Resp 24 | Ht 63.0 in | Wt 169.0 lb

## 2022-03-15 DIAGNOSIS — I6521 Occlusion and stenosis of right carotid artery: Secondary | ICD-10-CM

## 2022-03-21 ENCOUNTER — Telehealth: Payer: Self-pay

## 2022-03-21 NOTE — Telephone Encounter (Signed)
Pt called stating she had received a call yesterday, 6/7, but unsure from who or why. Returned pt's call, no answer, left vm relaying pt's upcoming appt date and time.

## 2022-03-25 ENCOUNTER — Telehealth: Payer: Self-pay | Admitting: Cardiology

## 2022-03-25 NOTE — Telephone Encounter (Signed)
   Pre-operative Risk Assessment    Patient Name: [name]  DOB: Mar 03, 1946 MRN: [id number]      Request for Surgical Clearance    Procedure:   TCAR  Date of Surgery:  Clearance TBD                                 Surgeon:  Dr. Gwenlyn Saran Surgeon's Group or Practice Name:  [facility] Phone number:  [phone] Fax number:  [phone]   Type of Clearance Requested:   - Medical    Type of Anesthesia:  [place] said she is not sure    Additional requests/questions:    Dorthey Sawyer   03/25/2022, 3:26 PM

## 2022-03-26 DIAGNOSIS — I2510 Atherosclerotic heart disease of native coronary artery without angina pectoris: Secondary | ICD-10-CM | POA: Diagnosis not present

## 2022-03-26 DIAGNOSIS — I739 Peripheral vascular disease, unspecified: Secondary | ICD-10-CM | POA: Diagnosis not present

## 2022-03-26 DIAGNOSIS — I10 Essential (primary) hypertension: Secondary | ICD-10-CM | POA: Diagnosis not present

## 2022-03-26 DIAGNOSIS — G4733 Obstructive sleep apnea (adult) (pediatric): Secondary | ICD-10-CM | POA: Diagnosis not present

## 2022-03-26 DIAGNOSIS — E1151 Type 2 diabetes mellitus with diabetic peripheral angiopathy without gangrene: Secondary | ICD-10-CM | POA: Diagnosis not present

## 2022-03-26 DIAGNOSIS — E46 Unspecified protein-calorie malnutrition: Secondary | ICD-10-CM | POA: Diagnosis not present

## 2022-03-26 DIAGNOSIS — E785 Hyperlipidemia, unspecified: Secondary | ICD-10-CM | POA: Diagnosis not present

## 2022-03-26 DIAGNOSIS — J449 Chronic obstructive pulmonary disease, unspecified: Secondary | ICD-10-CM | POA: Diagnosis not present

## 2022-03-26 NOTE — Telephone Encounter (Signed)
LMOM for patient to return call.

## 2022-03-26 NOTE — Telephone Encounter (Signed)
Primary Cardiologist:Peter Martinique, MD  Chart reviewed as part of pre-operative protocol coverage. Because of [name]'s past medical history and time since last visit, he/she will require a virtual visit/telephone call in order to better assess preoperative cardiovascular risk.  Pre-op covering staff: - Please contact patient, obtain consent, and schedule appointment   There is no request to hold Plavix or aspirin for upcoming procedure.   Joanna Life, NP-C    03/26/2022, 10:46 AM [place] [address] Office [phone] Fax [phone]

## 2022-03-27 NOTE — Telephone Encounter (Signed)
Left message x 2 for the pt to call to set up tele pre op appt.

## 2022-03-28 ENCOUNTER — Telehealth: Payer: Self-pay | Admitting: *Deleted

## 2022-03-28 NOTE — Telephone Encounter (Signed)
Pt called back and has been scheduled for a tele pre op appt 04/01/22 @ 11:40. Med rec and consent are done.     Patient Consent for Virtual Visit        [name] has provided verbal consent on 03/28/2022 for a virtual visit (video or telephone).   CONSENT FOR VIRTUAL VISIT FOR:  [name]  By participating in this virtual visit I agree to the following:  I hereby voluntarily request, consent and authorize [facility] and its employed or contracted physicians, physician assistants, nurse practitioners or other licensed health care professionals (the Practitioner), to provide me with telemedicine health care services (the "Services") as deemed necessary by the treating Practitioner. I acknowledge and consent to receive the Services by the Practitioner via telemedicine. I understand that the telemedicine visit will involve communicating with the Practitioner through live audiovisual communication technology and the disclosure of certain medical information by electronic transmission. I acknowledge that I have been given the opportunity to request an in-person assessment or other available alternative prior to the telemedicine visit and am voluntarily participating in the telemedicine visit.  I understand that I have the right to withhold or withdraw my consent to the use of telemedicine in the course of my care at any time, without affecting my right to future care or treatment, and that the Practitioner or I may terminate the telemedicine visit at any time. I understand that I have the right to inspect all information obtained and/or recorded in the course of the telemedicine visit and may receive copies of available information for a reasonable fee.  I understand that some of the potential risks of receiving the Services via telemedicine include:  Delay or interruption in medical evaluation due to technological equipment failure or disruption; Information transmitted may not be  sufficient (e.g. poor resolution of images) to allow for appropriate medical decision making by the Practitioner; and/or  In rare instances, security protocols could fail, causing a breach of personal health information.  Furthermore, I acknowledge that it is my responsibility to provide information about my medical history, conditions and care that is complete and accurate to the best of my ability. I acknowledge that Practitioner's advice, recommendations, and/or decision may be based on factors not within their control, such as incomplete or inaccurate data provided by me or distortions of diagnostic images or specimens that may result from electronic transmissions. I understand that the practice of medicine is not an exact science and that Practitioner makes no warranties or guarantees regarding treatment outcomes. I acknowledge that a copy of this consent can be made available to me via my patient portal ([place]), or I can request a printed copy by calling the office of [facility].    I understand that my insurance will be billed for this visit.   I have read or had this consent read to me. I understand the contents of this consent, which adequately explains the benefits and risks of the Services being provided via telemedicine.  I have been provided ample opportunity to ask questions regarding this consent and the Services and have had my questions answered to my satisfaction. I give my informed consent for the services to be provided through the use of telemedicine in my medical care

## 2022-03-28 NOTE — Telephone Encounter (Signed)
Pt called back and has been scheduled for a tele pre op appt 04/01/22 @ 11:40. Med rec and consent are done.

## 2022-03-29 ENCOUNTER — Ambulatory Visit: Payer: Medicare HMO | Admitting: Vascular Surgery

## 2022-04-01 ENCOUNTER — Ambulatory Visit (INDEPENDENT_AMBULATORY_CARE_PROVIDER_SITE_OTHER): Payer: Medicare HMO | Admitting: General Practice

## 2022-04-01 DIAGNOSIS — Z01810 Encounter for preprocedural cardiovascular examination: Secondary | ICD-10-CM | POA: Diagnosis not present

## 2022-04-01 NOTE — Progress Notes (Signed)
Virtual Visit via Telephone Note   Because of [name]'s co-morbid illnesses, she is at least at moderate risk for complications without adequate follow up.  This format is felt to be most appropriate for this patient at this time.  The patient did not have access to video technology/had technical difficulties with video requiring transitioning to audio format only (telephone).  All issues noted in this document were discussed and addressed.  No physical exam could be performed with this format.  Please refer to the patient's chart for her consent to telehealth for [facility].  Evaluation Performed:  Preoperative cardiovascular risk assessment _____________   Date:  04/01/2022   Patient ID:  [name] 11-19-1945, MRN [id number] Patient Location:  Home Provider location:   Office  Primary Care Provider:  Donnajean Lopes, MD Primary Cardiologist:  Peter Martinique, MD  Chief Complaint / Patient Profile   76 y.o. y/o female with a h/o carotid artery disease, coronary artery disease, diabetes, hypertension, GERD, CVA who is pending TCAR and presents today for telephonic preoperative cardiovascular risk assessment.  Past Medical History    Past Medical History:  Diagnosis Date   Allergy    Anemia    past hx per pt    Anxiety    Arthritis    back    Arthropathy, unspecified, site unspecified    Blind loop syndrome    Carotid artery disease ([place])    documented occlusion of left ICA   Cataract    removed both eyes    Clotting disorder (HCC)    Hx Clot in leg per pt    Colostomy present ([place])    Coronary artery disease    a. known 60-70% mid-LAD stenosis with caths in 1999, 2002, 2006, and 2012 showing stable anatomy b. 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[place])    Diabetes mellitus    Diverticulosis    Dizziness    chronic   Dyslipidemia    Dyspnea    with exertion   Fatty liver    GERD (gastroesophageal reflux disease)     hiatial hernia   Heart murmur    Hepatomegaly    Hiatal hernia    HTN (hypertension)    Hyperlipidemia    Hypertension    Irritable bowel syndrome    Melanosis coli    Metatarsal fracture right   Normal nuclear stress test    nml 01/03/10;06/19/07   Other, mixed, or unspecified nondependent drug abuse, unspecified    Pancreatitis, chronic ([place])    Peripheral vascular disease ([place]) 02/20/10   s/p stent of left SFA   Seizure ([place]) 11/04/2018   last 1 yr 2020 per pt unsure month    Sleep apnea    no cpap    Stroke ([place])    Thyroid nodule    Unspecified essential hypertension    Vitamin B12 deficiency    Past Surgical History:  Procedure Laterality Date   ABDOMINAL HYSTERECTOMY     partial   AUGMENTATION MAMMAPLASTY     bilateral 1976 retro    BREAST ENHANCEMENT SURGERY     CARDIAC CATHETERIZATION  07/07/98;08/31/01;03/04/05   60 -70% LAD   CATARACT EXTRACTION Bilateral    COLON RESECTION N/A 10/14/2017   Procedure: EXPLORATORY LAPAROTOMY, EXTENDED RIGHT COLECTOMY; APPLICATION OF ABDOMINAL VACUUM DRESSING;  Surgeon: Jovita Kussmaul, MD;  Location: [facility];  Service: General;  Laterality: N/A;   COLONOSCOPY     ENDOBRONCHIAL  ULTRASOUND Bilateral 02/24/2017   Procedure: ENDOBRONCHIAL ULTRASOUND;  Surgeon: Collene Gobble, MD;  Location: [facility] END[facility];  Service: Cardiopulmonary;  Laterality: Bilateral;   FEMORAL ARTERY STENT     Left leg   LAPAROTOMY N/A 10/16/2017   Procedure: EXPLORATORY LAPAROTOMY, PARTIAL OMENTECTOMY, RESECTION ISCHEMIC ILEUM 854OE, APPLICATION OF VAC ABDOMINAL DRESSING;  Surgeon: Armandina Gemma, MD;  Location: [facility];  Service: General;  Laterality: N/A;   LAPAROTOMY N/A 10/18/2017   Procedure: RE-EXPLORATION OF ABDOMEN, ILEOSTOMY CREATION;  Surgeon: Stark Klein, MD;  Location: [facility];  Service: General;  Laterality: N/A;   LUNG SURGERY  03/2017   Benign polyps removed   PATELLA REALIGNMENT Left    POLYPECTOMY     SIGMOIDOSCOPY     TONSILLECTOMY       Allergies  Allergies  Allergen Reactions   Adhesive [Tape] Other (See Comments)    "Took off my skin"   Other Rash    "Took off my skin"   Ace Inhibitors Other (See Comments)    Other reaction(s): Unknown   Sulfa Antibiotics     Other reaction(s): Unknown   Topiramate Other (See Comments)   Codeine Other (See Comments)    Altered mental state    Iodinated Contrast Media Rash    History of Present Illness    [name] is a 76 y.o. female who presents via audio/video conferencing for a telehealth visit today.  Pt was last seen in cardiology clinic on 12/11/2021 by Dr. Martinique.  At that time [name] was doing well .  The patient is now pending procedure as outlined above. Since her last visit, she is doing well from a cardiac standpoint.  Today she denies chest pain, shortness of breath, lower extremity edema, fatigue, palpitations, melena, hematuria, hemoptysis, diaphoresis, weakness, presyncope, syncope, orthopnea, and PND.    Home Medications    Prior to Admission medications   Medication Sig Start Date End Date Taking? Authorizing Provider  acetaminophen (TYLENOL) 325 MG tablet Take 650 mg by mouth every 6 (six) hours as needed for mild pain.    [provider]  albuterol (VENTOLIN HFA) 108 (90 Base) MCG/ACT inhaler Inhale 1-2 puffs into the lungs every 6 (six) hours as needed for wheezing or shortness of breath. 03/30/21   [provider]  amLODipine (NORVASC) 10 MG tablet Take 10 mg by mouth daily.    [provider]  aspirin 81 MG chewable tablet Chew 1 tablet (81 mg total) by mouth daily. 12/28/21   Barb Merino, MD  atorvastatin (LIPITOR) 20 MG tablet Take 20 mg by mouth daily.    [provider]  clopidogrel (PLAVIX) 75 MG tablet Take 75 mg by mouth daily.    [provider]  [employer] DIGESTIVE PROBIOTIC 250 MG capsule Take 500 mg by mouth daily. 06/27/21   [provider]  diphenhydrAMINE (BENADRYL) 50 MG  capsule Take 50 mg by mouth 1 hour prior to your procedure. Patient not taking: Reported on 03/28/2022 03/13/22   [name], MD  fluticasone [facility]) 50 MCG/ACT nasal spray Place 2 sprays into both nostrils daily as needed for allergies. 01/29/16   [provider]  furosemide (LASIX) 20 MG tablet Take 20 mg by mouth daily. 08/06/21   [provider]  levETIRAcetam (KEPPRA) 500 MG tablet TAKE 1 TABLET BY MOUTH TWICE A DAY Patient taking differently: Take 500 mg by mouth 2 (two) times daily. 01/20/20   Dohmeier, Asencion Partridge, MD  loperamide (IMODIUM) 2 MG capsule  Take 2 mg by mouth daily as needed for diarrhea or loose stools.     [provider]  loperamide (IMODIUM) 2 MG capsule Take 1 capsule (2 mg total) by mouth 3 (three) times daily as needed for diarrhea or loose stools. 12/28/21   Barb Merino, MD  losartan (COZAAR) 100 MG tablet Take 1 tablet (100 mg total) by mouth daily. 12/28/21   Barb Merino, MD  losartan (COZAAR) 50 MG tablet Take 50 mg by mouth daily. 03/05/22   [provider]  magnesium oxide (MAG-OX) 400 MG tablet Take 1 tablet by mouth daily. 05/08/20   [provider]  metoprolol succinate (TOPROL-XL) 100 MG 24 hr tablet Take 100 mg by mouth daily. 12/20/21   [provider]  mirtazapine (REMERON) 15 MG tablet Take 15 mg by mouth at bedtime. 07/18/21   [provider]  montelukast (SINGULAIR) 10 MG tablet Take 10 mg by mouth daily. 08/09/18   [provider]  nitroGLYCERIN (NITROSTAT) 0.4 MG SL tablet Place 1 tablet (0.4 mg total) under the tongue every 5 (five) minutes as needed for chest pain. 11/08/15   Martinique, Peter M, MD  ondansetron (ZOFRAN) 4 MG tablet Take 1 tablet (4 mg total) by mouth every 6 (six) hours as needed for nausea or vomiting. 10/24/21   Esterwood, Amy S, PA-C  Pancrelipase, Lip-Prot-Amyl, (ZENPEP) 40000-126000 units CPEP Take 2 capsules with meals and one capsule with snacks. Patient taking  differently: Take 1 capsule by mouth daily with breakfast. 06/08/21   Ladene Artist, MD  pantoprazole (PROTONIX) 40 MG tablet Take 1 tablet (40 mg total) by mouth 2 (two) times daily. 01/31/22   Ladene Artist, MD  Polysaccharide-Iron Complex 150 MG CAPS Take 1 tablet by mouth daily.    [provider]  predniSONE (DELTASONE) 50 MG tablet One tablet ('50mg'$ ) 13 hours prior to procedure; one tablet ('50mg'$ ) 7 hours prior to procedure and then one tablet (50 mg) one hour prior to procedure. Patient not taking: Reported on 03/28/2022 03/13/22   [name], MD  triamterene-hydrochlorothiazide (MAXZIDE-25) 37.5-25 MG tablet Take 0.5 tablets by mouth every morning. 12/20/21   [provider]  venlafaxine XR (EFFEXOR-XR) 75 MG 24 hr capsule Take 1 capsule (75 mg total) by mouth daily. 05/18/20   [provider]  vitamin B-12 (CYANOCOBALAMIN) 500 MCG tablet Take 500 mcg by mouth daily.    [provider]  Vitamin D, Ergocalciferol, (DRISDOL) 1.25 MG (50000 UT) CAPS capsule Take 50,000 Units by mouth every Wednesday.     [provider]    Physical Exam    Vital Signs:  DENALI SHARMA does not have vital signs available for review today.  Given telephonic nature of communication, physical exam is limited. AAOx3. NAD. Normal affect.  Speech and respirations are unlabored.  Accessory Clinical Findings    None  Assessment & Plan    1.  Preoperative Cardiovascular Risk Assessment: TCAR, VVS, Dr. Unk Lightning     Primary Cardiologist: Peter Martinique, MD  Chart reviewed as part of pre-operative protocol coverage. Given past medical history and time since last visit, based on ACC/AHA guidelines, SHAMYRA FARIAS would be at acceptable risk for the planned procedure without further cardiovascular testing.   Patient was advised that if she develops new symptoms prior to surgery to contact our office to arrange a follow-up appointment.  He verbalized  understanding.   Her RCRI is a class IV risk, 11% risk of major cardiac event.  She is able to complete greater than 4 METS of physical activity.    A copy of this note will be routed to requesting surgeon.  Time:   Today, I have spent 7 minutes with the patient with telehealth technology discussing medical history, symptoms, and management plan.  Prior to her phone evaluation I spent greater than 10 minutes reviewing her past medical history and medications.   Deberah Pelton, NP  04/01/2022, 8:26 AM

## 2022-04-03 ENCOUNTER — Encounter: Payer: Self-pay | Admitting: Neurology

## 2022-04-03 ENCOUNTER — Telehealth: Payer: Self-pay | Admitting: Neurology

## 2022-04-03 ENCOUNTER — Inpatient Hospital Stay: Payer: Medicare HMO | Admitting: Neurology

## 2022-04-03 NOTE — Telephone Encounter (Signed)
Pt was a no show again for the new pt slot post hospital f/u.  Since this is technically new patient and there are 2 no shows I will have to evaluate whether the patient will be dc from practice. She has a upcoming apt scheduled with Amy as well.

## 2022-04-04 ENCOUNTER — Encounter: Payer: Self-pay | Admitting: Neurology

## 2022-04-08 ENCOUNTER — Emergency Department (HOSPITAL_COMMUNITY): Payer: Medicare HMO

## 2022-04-08 ENCOUNTER — Other Ambulatory Visit: Payer: Self-pay

## 2022-04-08 ENCOUNTER — Encounter (HOSPITAL_COMMUNITY): Payer: Self-pay

## 2022-04-08 ENCOUNTER — Observation Stay (HOSPITAL_COMMUNITY)
Admission: EM | Admit: 2022-04-08 | Discharge: 2022-04-09 | Disposition: A | Discharge status: Home / Self Care | Payer: Medicare HMO | Attending: Internal Medicine | Admitting: Internal Medicine

## 2022-04-08 DIAGNOSIS — I5042 Chronic combined systolic (congestive) and diastolic (congestive) heart failure: Secondary | ICD-10-CM | POA: Diagnosis not present

## 2022-04-08 DIAGNOSIS — N390 Urinary tract infection, site not specified: Secondary | ICD-10-CM | POA: Diagnosis not present

## 2022-04-08 DIAGNOSIS — Z79899 Other long term (current) drug therapy: Secondary | ICD-10-CM | POA: Insufficient documentation

## 2022-04-08 DIAGNOSIS — N179 Acute kidney failure, unspecified: Secondary | ICD-10-CM | POA: Diagnosis not present

## 2022-04-08 DIAGNOSIS — E1122 Type 2 diabetes mellitus with diabetic chronic kidney disease: Secondary | ICD-10-CM | POA: Diagnosis not present

## 2022-04-08 DIAGNOSIS — R8281 Pyuria: Secondary | ICD-10-CM | POA: Diagnosis present

## 2022-04-08 DIAGNOSIS — I160 Hypertensive urgency: Secondary | ICD-10-CM | POA: Diagnosis not present

## 2022-04-08 DIAGNOSIS — R2689 Other abnormalities of gait and mobility: Secondary | ICD-10-CM | POA: Insufficient documentation

## 2022-04-08 DIAGNOSIS — Z7902 Long term (current) use of antithrombotics/antiplatelets: Secondary | ICD-10-CM | POA: Diagnosis not present

## 2022-04-08 DIAGNOSIS — Z95820 Peripheral vascular angioplasty status with implants and grafts: Secondary | ICD-10-CM | POA: Diagnosis not present

## 2022-04-08 DIAGNOSIS — I6521 Occlusion and stenosis of right carotid artery: Secondary | ICD-10-CM | POA: Diagnosis present

## 2022-04-08 DIAGNOSIS — I6522 Occlusion and stenosis of left carotid artery: Secondary | ICD-10-CM | POA: Diagnosis present

## 2022-04-08 DIAGNOSIS — G319 Degenerative disease of nervous system, unspecified: Secondary | ICD-10-CM | POA: Diagnosis not present

## 2022-04-08 DIAGNOSIS — N184 Chronic kidney disease, stage 4 (severe): Secondary | ICD-10-CM | POA: Diagnosis not present

## 2022-04-08 DIAGNOSIS — I2510 Atherosclerotic heart disease of native coronary artery without angina pectoris: Secondary | ICD-10-CM | POA: Diagnosis not present

## 2022-04-08 DIAGNOSIS — I130 Hypertensive heart and chronic kidney disease with heart failure and stage 1 through stage 4 chronic kidney disease, or unspecified chronic kidney disease: Secondary | ICD-10-CM | POA: Insufficient documentation

## 2022-04-08 DIAGNOSIS — Z87891 Personal history of nicotine dependence: Secondary | ICD-10-CM | POA: Diagnosis not present

## 2022-04-08 DIAGNOSIS — R0602 Shortness of breath: Secondary | ICD-10-CM | POA: Diagnosis not present

## 2022-04-08 DIAGNOSIS — Z8673 Personal history of transient ischemic attack (TIA), and cerebral infarction without residual deficits: Secondary | ICD-10-CM | POA: Insufficient documentation

## 2022-04-08 DIAGNOSIS — Z7982 Long term (current) use of aspirin: Secondary | ICD-10-CM | POA: Insufficient documentation

## 2022-04-08 DIAGNOSIS — Z8669 Personal history of other diseases of the nervous system and sense organs: Secondary | ICD-10-CM

## 2022-04-08 DIAGNOSIS — R059 Cough, unspecified: Secondary | ICD-10-CM | POA: Diagnosis not present

## 2022-04-08 DIAGNOSIS — I10 Essential (primary) hypertension: Secondary | ICD-10-CM | POA: Diagnosis not present

## 2022-04-08 DIAGNOSIS — R531 Weakness: Secondary | ICD-10-CM | POA: Diagnosis present

## 2022-04-08 LAB — CBC WITH DIFFERENTIAL/PLATELET
Abs Immature Granulocytes: 0.04 10*3/uL (ref 0.00–0.07)
Basophils Absolute: 0.1 10*3/uL (ref 0.0–0.1)
Basophils Relative: 0 %
Eosinophils Absolute: 0.5 10*3/uL (ref 0.0–0.5)
Eosinophils Relative: 4 %
HCT: 38.7 % (ref 36.0–46.0)
Hemoglobin: 11.9 g/dL — ABNORMAL LOW (ref 12.0–15.0)
Immature Granulocytes: 0 %
Lymphocytes Relative: 22 %
Lymphs Abs: 2.6 10*3/uL (ref 0.7–4.0)
MCH: 29 pg (ref 26.0–34.0)
MCHC: 30.7 g/dL (ref 30.0–36.0)
MCV: 94.4 fL (ref 80.0–100.0)
Monocytes Absolute: 0.8 10*3/uL (ref 0.1–1.0)
Monocytes Relative: 7 %
Neutro Abs: 7.8 10*3/uL — ABNORMAL HIGH (ref 1.7–7.7)
Neutrophils Relative %: 67 %
Platelets: 247 10*3/uL (ref 150–400)
RBC: 4.1 MIL/uL (ref 3.87–5.11)
RDW: 12.9 % (ref 11.5–15.5)
WBC: 11.8 10*3/uL — ABNORMAL HIGH (ref 4.0–10.5)
nRBC: 0 % (ref 0.0–0.2)

## 2022-04-08 LAB — URINALYSIS, ROUTINE W REFLEX MICROSCOPIC
Bilirubin Urine: NEGATIVE
Glucose, UA: NEGATIVE mg/dL
Hgb urine dipstick: NEGATIVE
Ketones, ur: NEGATIVE mg/dL
Nitrite: POSITIVE — AB
Protein, ur: NEGATIVE mg/dL
Specific Gravity, Urine: 1.008 (ref 1.005–1.030)
pH: 5 (ref 5.0–8.0)

## 2022-04-08 LAB — COMPREHENSIVE METABOLIC PANEL
ALT: 16 U/L (ref 0–44)
AST: 21 U/L (ref 15–41)
Albumin: 3.3 g/dL — ABNORMAL LOW (ref 3.5–5.0)
Alkaline Phosphatase: 87 U/L (ref 38–126)
Anion gap: 11 (ref 5–15)
BUN: 20 mg/dL (ref 8–23)
CO2: 21 mmol/L — ABNORMAL LOW (ref 22–32)
Calcium: 8.7 mg/dL — ABNORMAL LOW (ref 8.9–10.3)
Chloride: 109 mmol/L (ref 98–111)
Creatinine, Ser: 1.82 mg/dL — ABNORMAL HIGH (ref 0.44–1.00)
GFR, Estimated: 29 mL/min — ABNORMAL LOW (ref >60)
Glucose, Bld: 103 mg/dL — ABNORMAL HIGH (ref 70–99)
Potassium: 4.5 mmol/L (ref 3.5–5.1)
Sodium: 141 mmol/L (ref 135–145)
Total Bilirubin: 0.5 mg/dL (ref 0.3–1.2)
Total Protein: 6.3 g/dL — ABNORMAL LOW (ref 6.5–8.1)

## 2022-04-08 LAB — CBG MONITORING, ED: Glucose-Capillary: 93 mg/dL (ref 70–99)

## 2022-04-08 LAB — TROPONIN I (HIGH SENSITIVITY)
Troponin I (High Sensitivity): 18 ng/L — ABNORMAL HIGH (ref <18)
Troponin I (High Sensitivity): 16 ng/L (ref <18)

## 2022-04-08 LAB — LIPASE, BLOOD: Lipase: 20 U/L (ref 11–51)

## 2022-04-08 MED ORDER — LABETALOL HCL 5 MG/ML IV SOLN
10.0000 mg | Freq: Once | INTRAVENOUS | Status: AC
  Administered 2022-04-08: 10 mg via INTRAVENOUS
  Filled 2022-04-08: qty 4

## 2022-04-08 MED ORDER — SODIUM CHLORIDE 0.9 % IV SOLN
1.0000 g | Freq: Once | INTRAVENOUS | Status: AC
  Administered 2022-04-09: 1 g via INTRAVENOUS
  Filled 2022-04-08: qty 10

## 2022-04-08 MED ORDER — LACTATED RINGERS IV BOLUS
1000.0000 mL | Freq: Once | INTRAVENOUS | Status: AC
  Administered 2022-04-08: 1000 mL via INTRAVENOUS

## 2022-04-08 NOTE — ED Triage Notes (Addendum)
Pt to ED pov from home.  Pt states she feels like she has needs fluid because she feels weak and dizzy and has had diarrhea for past 3 days. Pt c/o pain in left knee, chronic pain.

## 2022-04-08 NOTE — ED Provider Notes (Signed)
[facility] EMERGENCY DEPARTMENT Provider Note   CSN: [id number] Arrival date & time: 04/08/22  1639     History  Chief Complaint  Patient presents with   Weakness    [name] is a 76 y.o. female.  76 year old female with a past medical history of coronary artery disease, hypertension, prior CVA with known left ICA and right ICA stenosis, CKD stage IV presents to the ED with 3 days of generalized fatigue.  Patient states that she has been feeling weak over the last several days with associated nausea and nonbloody diarrhea. She endorses feeling dizzy with this and states she has had difficulty walking and felt uneasy during this time. Denies visual disturbances. Patient expresses concern for possible dehydration and is requesting IV fluids.  She is status post closure of enterostomy and reversal of anastomosis and states she had similar symptoms over a year ago after her reversal.  She denies associated fevers, chills, chest pain, shortness of breath, headaches.   The history is provided by the patient and medical records.       Home Medications Prior to Admission medications   Medication Sig Start Date End Date Taking? Authorizing Provider  acetaminophen (TYLENOL) 325 MG tablet Take 650 mg by mouth every 6 (six) hours as needed for mild pain.   Yes [provider]  albuterol (VENTOLIN HFA) 108 (90 Base) MCG/ACT inhaler Inhale 1-2 puffs into the lungs every 6 (six) hours as needed for wheezing or shortness of breath. 03/30/21  Yes [provider]  amLODipine (NORVASC) 10 MG tablet Take 10 mg by mouth daily.   Yes [provider]  aspirin 81 MG chewable tablet Chew 1 tablet (81 mg total) by mouth daily. 12/28/21  Yes Dorcas Carrow, MD  atorvastatin (LIPITOR) 20 MG tablet Take 20 mg by mouth daily.   Yes [provider]  cholestyramine (QUESTRAN) 4 g packet Take 0.5 packets by mouth in the morning and at bedtime. 03/30/22  Yes  [provider]  clopidogrel (PLAVIX) 75 MG tablet Take 75 mg by mouth daily.   Yes [provider]  [employer]S DIGESTIVE PROBIOTIC 250 MG capsule Take 500 mg by mouth daily. 06/27/21  Yes [provider]  fluticasone (FLONASE) 50 MCG/ACT nasal spray Place 2 sprays into both nostrils daily as needed for allergies. 01/29/16  Yes [provider]  furosemide (LASIX) 20 MG tablet Take 20 mg by mouth daily. 08/06/21  Yes [provider]  HYDROcodone-acetaminophen (NORCO) 10-325 MG tablet Take 1 tablet by mouth 4 (four) times daily as needed for moderate pain. 04/01/22  Yes [provider]  levETIRAcetam (KEPPRA) 500 MG tablet TAKE 1 TABLET BY MOUTH TWICE A DAY Patient taking differently: Take 500 mg by mouth 2 (two) times daily. 01/20/20  Yes Dohmeier, Porfirio Mylar, MD  losartan (COZAAR) 100 MG tablet Take 1 tablet (100 mg total) by mouth daily. Patient taking differently: Take 100 mg by mouth daily. Total of 150mg  12/28/21  Yes Ghimire, Lyndel Safe, MD  losartan (COZAAR) 50 MG tablet Take 50 mg by mouth daily. Total of 150 mg 03/05/22  Yes [provider]  magnesium oxide (MAG-OX) 400 MG tablet Take 1 tablet by mouth daily. 05/08/20  Yes [provider]  metoprolol succinate (TOPROL-XL) 100 MG 24 hr tablet Take 100 mg by mouth daily. 12/20/21  Yes [provider]  mirtazapine (REMERON) 15 MG tablet Take 15 mg by mouth at bedtime. 07/18/21  Yes [provider]  montelukast (SINGULAIR)  10 MG tablet Take 10 mg by mouth daily. 08/09/18  Yes [provider]  nitroGLYCERIN (NITROSTAT) 0.4 MG SL tablet Place 1 tablet (0.4 mg total) under the tongue every 5 (five) minutes as needed for chest pain. 11/08/15  Yes [place], Peter M, MD  ondansetron (ZOFRAN) 4 MG tablet Take 1 tablet (4 mg total) by mouth every 6 (six) hours as needed for nausea or vomiting. 10/24/21  Yes Esterwood, Amy S, PA-C  Pancrelipase, Lip-Prot-Amyl, (ZENPEP) 40000-126000 units  CPEP Take 2 capsules with meals and one capsule with snacks. Patient taking differently: Take 1 capsule by mouth daily with breakfast. 06/08/21  Yes Meryl Dare, MD  pantoprazole (PROTONIX) 40 MG tablet Take 1 tablet (40 mg total) by mouth 2 (two) times daily. 01/31/22  Yes Meryl Dare, MD  Polysaccharide-Iron Complex 150 MG CAPS Take 1 tablet by mouth daily.   Yes [provider]  triamterene-hydrochlorothiazide (MAXZIDE-25) 37.5-25 MG tablet Take 0.5 tablets by mouth every morning. 12/20/21  Yes [provider]  venlafaxine XR (EFFEXOR-XR) 75 MG 24 hr capsule Take 1 capsule (75 mg total) by mouth daily. 05/18/20  Yes [provider]  vitamin B-12 (CYANOCOBALAMIN) 500 MCG tablet Take 500 mcg by mouth daily.   Yes [provider]  Vitamin D, Ergocalciferol, (DRISDOL) 1.25 MG (50000 UT) CAPS capsule Take 50,000 Units by mouth every Wednesday.    Yes [provider]  diphenhydrAMINE (BENADRYL) 50 MG capsule Take 50 mg by mouth 1 hour prior to your procedure. Patient not taking: Reported on 03/28/2022 03/13/22   Victorino Sparrow, MD  predniSONE (DELTASONE) 50 MG tablet One tablet (50mg ) 13 hours prior to procedure; one tablet (50mg ) 7 hours prior to procedure and then one tablet (50 mg) one hour prior to procedure. Patient not taking: Reported on 03/28/2022 03/13/22   Victorino Sparrow, MD      Allergies    Adhesive [tape], Other, Ace inhibitors, Sulfa antibiotics, Topiramate, Codeine, and Iodinated contrast media    Review of Systems   Review of Systems  Constitutional:  Positive for fatigue. Negative for fever.  Eyes:  Negative for visual disturbance.  Respiratory:  Positive for cough and shortness of breath.   Cardiovascular:  Negative for chest pain.  Gastrointestinal:  Positive for diarrhea, nausea and vomiting. Negative for abdominal pain and blood in stool.  Neurological:  Positive for dizziness, weakness and light-headedness. Negative for  syncope, speech difficulty and headaches.    Physical Exam Updated Vital Signs BP (!) 190/88   Pulse 97   Temp 97.8 F (36.6 C) (Oral)   Resp 11   Ht 5\' 3"  (1.6 m)   Wt 77.1 kg   SpO2 97%   BMI 30.11 kg/m  Physical Exam Vitals and nursing note reviewed.  Constitutional:      General: She is not in acute distress.    Appearance: Normal appearance. She is well-developed. She is not ill-appearing.  HENT:     Head: Normocephalic and atraumatic.  Cardiovascular:     Rate and Rhythm: Normal rate and regular rhythm.     Pulses:          Radial pulses are 2+ on the right side and 2+ on the left side.     Heart sounds: No murmur heard. Pulmonary:     Effort: Pulmonary effort is normal. No respiratory distress.     Breath sounds: No decreased air movement. Rhonchi present.     Comments: Coarse breath sounds noted throughout  all lung fields Abdominal:     Palpations: Abdomen is soft.     Tenderness: There is no abdominal tenderness. There is no guarding.  Musculoskeletal:     Right lower leg: 1+ Pitting Edema present.     Left lower leg: 1+ Pitting Edema present.  Skin:    General: Skin is warm and dry.  Neurological:     Mental Status: She is alert.     ED Results / Procedures / Treatments   Labs (all labs ordered are listed, but only abnormal results are displayed) Labs Reviewed  COMPREHENSIVE METABOLIC PANEL - Abnormal; Notable for the following components:      Result Value   CO2 21 (*)    Glucose, Bld 103 (*)    Creatinine, Ser 1.82 (*)    Calcium 8.7 (*)    Total Protein 6.3 (*)    Albumin 3.3 (*)    GFR, Estimated 29 (*)    All other components within normal limits  CBC WITH DIFFERENTIAL/PLATELET - Abnormal; Notable for the following components:   WBC 11.8 (*)    Hemoglobin 11.9 (*)    Neutro Abs 7.8 (*)    All other components within normal limits  URINALYSIS, ROUTINE W REFLEX MICROSCOPIC - Abnormal; Notable for the following components:   APPearance HAZY  (*)    Nitrite POSITIVE (*)    Leukocytes,Ua SMALL (*)    Bacteria, UA MANY (*)    All other components within normal limits  TROPONIN I (HIGH SENSITIVITY) - Abnormal; Notable for the following components:   Troponin I (High Sensitivity) 18 (*)    All other components within normal limits  URINE CULTURE  LIPASE, BLOOD  CBG MONITORING, ED  TROPONIN I (HIGH SENSITIVITY)    EKG EKG Interpretation  Date/Time:  Monday April 08 2022 19:16:41 EDT Ventricular Rate:  86 PR Interval:  135 QRS Duration: 89 QT Interval:  381 QTC Calculation: 456 R Axis:   70 Text Interpretation: Sinus rhythm Borderline repolarization abnormality Confirmed by Blane Ohara [id number]) on 04/08/2022 7:30:39 PM  Radiology MR BRAIN WO CONTRAST  Result Date: 04/09/2022 CLINICAL DATA:  Initial evaluation for neuro deficit, stroke suspected. EXAM: MRI HEAD WITHOUT CONTRAST TECHNIQUE: Multiplanar, multiecho pulse sequences of the brain and surrounding structures were obtained without intravenous contrast. COMPARISON:  Prior study from 03/14/2022. FINDINGS: Brain: Examination degraded by motion. Age-related cerebral atrophy with moderate chronic small vessel ischemic disease. No acute or subacute infarct. No areas of chronic cortical infarction. No acute or chronic intracranial blood products. No mass lesion, midline shift or mass effect. No hydrocephalus or extra-axial fluid collection. Pituitary gland suprasellar region within normal limits. Vascular: Chronic left ICA occlusion noted. Major intracranial vascular flow voids are otherwise maintained. Skull and upper cervical spine: Craniocervical junctional limits. Bone marrow signal intensity normal. Scalp soft tissues demonstrate no acute finding. Focal soft tissue thinning at the left frontal scalp noted. Sinuses/Orbits: Prior bilateral ocular lens replacement. Scattered mucosal thickening noted within the ethmoidal air cells. No significant mastoid effusion. Other: None.  IMPRESSION: 1. No acute intracranial abnormality. 2. Age-related cerebral atrophy with moderate chronic small vessel ischemic disease. 3. Chronic left ICA occlusion. Electronically Signed   By: Rise Mu M.D.   On: 04/09/2022 00:43   DG Chest 1 View  Result Date: 04/08/2022 CLINICAL DATA:  Shortness of breath, cough EXAM: CHEST  1 VIEW COMPARISON:  12/25/2021 FINDINGS: Heart and mediastinal contours are within normal limits. No focal opacities or effusions. No acute bony abnormality.  Aortic atherosclerosis. IMPRESSION: No active disease. Electronically Signed   By: Charlett Nose M.D.   On: 04/08/2022 20:20    Procedures Procedures    Medications Ordered in ED Medications  labetalol (NORMODYNE) injection 10 mg (has no administration in time range)  lactated ringers bolus 1,000 mL (0 mLs Intravenous Stopped 04/08/22 2302)  labetalol (NORMODYNE) injection 10 mg (10 mg Intravenous Given 04/08/22 2104)  labetalol (NORMODYNE) injection 10 mg (10 mg Intravenous Given 04/08/22 2324)  cefTRIAXone (ROCEPHIN) 1 g in sodium chloride 0.9 % 100 mL IVPB (0 g Intravenous Stopped 04/09/22 0109)    ED Course/ Medical Decision Making/ A&P                           Medical Decision Making Amount and/or Complexity of Data Reviewed Labs: ordered. Radiology: ordered.  Risk Prescription drug management.   76 year old female with a history as above presents today with 3 days of generalized malaise, dizziness with associated diarrhea.  On arrival, patient is significantly hypertensive at 230/137 and tachycardic but afebrile.  She is breathing comfortably on room air.  Patient initially expressed concern for underlying dehydration and admits to poor p.o. intake over the last several days.  However, in the setting of her profound hypertension, I am most concerned for underlying hypertensive urgency/emergency.  Additionally considered ischemic stroke, basilar artery insufficiency, intracranial hemorrhage,  ACS, metabolic derangements, infectious symptoms.  I initially reviewed and interpreted the patient's labs which are largely reassuring.  She has a mild leukocytosis which is nonspecific, no new evidence of anemia or thrombocytopenia.  CMP without significant electrolyte abnormalities.  Her kidney function appears to be at baseline, no evidence of liver dysfunction. Troponin stable at 18 -> 16.  UA does appear to be questionably infected but given her vague symptoms and dizziness, we will plan to empirically treat.  She was given a dose of IV Rocephin as well as IV fluids.  On multiple reevaluations, blood pressure continued to remain elevated and she was ultimately treated with a x2 10mg  doses of IV labetalol with gradual improvement.  Patient does report compliance with her home antihypertensives thus unclear etiology for her hypertension today.  Patient did endorse episodes of cough and shortness of breath thus chest x-ray was obtained which I reviewed. Chest x-ray without evidence of cardiomegaly, focal airspace consolidation to support pneumonia, or pulmonary edema.  At the time of signout, patient is pending MRI brain to further evaluate for underlying ischemic versus posterior circulation infarcts.  I do anticipate the patient will require admission for underlying hypertensive urgency/emergency.  Please see the oncoming providers note for the remainder of patient's ED course and final disposition.   Final Clinical Impression(s) / ED Diagnoses Final diagnoses:  Hypertensive urgency  Urinary tract infection without hematuria, site unspecified  Acute kidney injury (nontraumatic) [facility])    Rx / DC Orders ED Discharge Orders     None         Lynsee Wands, [place], MD 04/09/22 6213    Blane Ohara, MD 04/09/22 [phone]

## 2022-04-09 ENCOUNTER — Other Ambulatory Visit (HOSPITAL_COMMUNITY): Payer: Self-pay

## 2022-04-09 DIAGNOSIS — I160 Hypertensive urgency: Secondary | ICD-10-CM | POA: Diagnosis present

## 2022-04-09 DIAGNOSIS — N184 Chronic kidney disease, stage 4 (severe): Secondary | ICD-10-CM | POA: Diagnosis not present

## 2022-04-09 DIAGNOSIS — Z8669 Personal history of other diseases of the nervous system and sense organs: Secondary | ICD-10-CM | POA: Diagnosis not present

## 2022-04-09 DIAGNOSIS — I6522 Occlusion and stenosis of left carotid artery: Secondary | ICD-10-CM

## 2022-04-09 DIAGNOSIS — I5042 Chronic combined systolic (congestive) and diastolic (congestive) heart failure: Secondary | ICD-10-CM | POA: Diagnosis present

## 2022-04-09 DIAGNOSIS — R8281 Pyuria: Secondary | ICD-10-CM | POA: Diagnosis not present

## 2022-04-09 DIAGNOSIS — I6521 Occlusion and stenosis of right carotid artery: Secondary | ICD-10-CM

## 2022-04-09 DIAGNOSIS — N390 Urinary tract infection, site not specified: Secondary | ICD-10-CM | POA: Diagnosis not present

## 2022-04-09 LAB — BASIC METABOLIC PANEL
Anion gap: 12 (ref 5–15)
BUN: 18 mg/dL (ref 8–23)
CO2: 18 mmol/L — ABNORMAL LOW (ref 22–32)
Calcium: 8.5 mg/dL — ABNORMAL LOW (ref 8.9–10.3)
Chloride: 111 mmol/L (ref 98–111)
Creatinine, Ser: 1.75 mg/dL — ABNORMAL HIGH (ref 0.44–1.00)
GFR, Estimated: 30 mL/min — ABNORMAL LOW (ref >60)
Glucose, Bld: 201 mg/dL — ABNORMAL HIGH (ref 70–99)
Potassium: 3.8 mmol/L (ref 3.5–5.1)
Sodium: 141 mmol/L (ref 135–145)

## 2022-04-09 LAB — CBC
HCT: 35 % — ABNORMAL LOW (ref 36.0–46.0)
Hemoglobin: 11.5 g/dL — ABNORMAL LOW (ref 12.0–15.0)
MCH: 30.6 pg (ref 26.0–34.0)
MCHC: 32.9 g/dL (ref 30.0–36.0)
MCV: 93.1 fL (ref 80.0–100.0)
Platelets: 213 10*3/uL (ref 150–400)
RBC: 3.76 MIL/uL — ABNORMAL LOW (ref 3.87–5.11)
RDW: 12.9 % (ref 11.5–15.5)
WBC: 9.2 10*3/uL (ref 4.0–10.5)
nRBC: 0 % (ref 0.0–0.2)

## 2022-04-09 MED ORDER — TRIAMTERENE-HCTZ 37.5-25 MG PO TABS
0.5000 | ORAL_TABLET | Freq: Every morning | ORAL | Status: DC
  Administered 2022-04-09: 0.5 via ORAL
  Filled 2022-04-09: qty 1

## 2022-04-09 MED ORDER — HYDRALAZINE HCL 100 MG PO TABS
100.0000 mg | ORAL_TABLET | Freq: Three times a day (TID) | ORAL | 0 refills | Status: DC
  Filled 2022-04-09: qty 90, 30d supply, fill #0

## 2022-04-09 MED ORDER — HYDRALAZINE HCL 25 MG PO TABS
50.0000 mg | ORAL_TABLET | Freq: Three times a day (TID) | ORAL | Status: DC
  Administered 2022-04-09: 50 mg via ORAL
  Filled 2022-04-09: qty 2

## 2022-04-09 MED ORDER — FLUTICASONE PROPIONATE 50 MCG/ACT NA SUSP
2.0000 | Freq: Every day | NASAL | Status: DC | PRN
Start: 2022-04-09 — End: 2022-04-09

## 2022-04-09 MED ORDER — LABETALOL HCL 5 MG/ML IV SOLN
10.0000 mg | Freq: Once | INTRAVENOUS | Status: AC
  Administered 2022-04-09: 10 mg via INTRAVENOUS
  Filled 2022-04-09: qty 4

## 2022-04-09 MED ORDER — LOSARTAN POTASSIUM 50 MG PO TABS: 50.0000 mg | ORAL_TABLET | Freq: Every day | ORAL | Status: DC

## 2022-04-09 MED ORDER — VENLAFAXINE HCL ER 75 MG PO CP24: 75.0000 mg | ORAL_CAPSULE | Freq: Every day | ORAL | Status: DC

## 2022-04-09 MED ORDER — HYDRALAZINE HCL 25 MG PO TABS: 100.0000 mg | ORAL_TABLET | Freq: Three times a day (TID) | ORAL | Status: DC

## 2022-04-09 MED ORDER — FUROSEMIDE 20 MG PO TABS
20.0000 mg | ORAL_TABLET | Freq: Every day | ORAL | Status: DC
  Administered 2022-04-09: 20 mg via ORAL
  Filled 2022-04-09: qty 1

## 2022-04-09 MED ORDER — ALBUTEROL SULFATE (2.5 MG/3ML) 0.083% IN NEBU: 2.5000 mg | INHALATION_SOLUTION | Freq: Four times a day (QID) | RESPIRATORY_TRACT | Status: DC | PRN

## 2022-04-09 MED ORDER — ACETAMINOPHEN 325 MG PO TABS: 650.0000 mg | ORAL_TABLET | Freq: Four times a day (QID) | ORAL | Status: DC | PRN

## 2022-04-09 MED ORDER — LEVETIRACETAM 500 MG PO TABS
500.0000 mg | ORAL_TABLET | Freq: Two times a day (BID) | ORAL | Status: DC
  Administered 2022-04-09: 500 mg via ORAL
  Filled 2022-04-09 (×2): qty 1

## 2022-04-09 MED ORDER — CHOLESTYRAMINE 4 G PO PACK: 0.5000 | PACK | Freq: Two times a day (BID) | ORAL | Status: DC

## 2022-04-09 MED ORDER — ZOLPIDEM TARTRATE 5 MG PO TABS
5.0000 mg | ORAL_TABLET | Freq: Every evening | ORAL | Status: DC | PRN
  Administered 2022-04-09: 5 mg via ORAL
  Filled 2022-04-09: qty 1

## 2022-04-09 MED ORDER — PANCRELIPASE (LIP-PROT-AMYL) 40000-126000 UNITS PO CPEP
1.0000 | ORAL_CAPSULE | Freq: Every day | ORAL | Status: DC
Start: 2022-04-09 — End: 2022-04-09

## 2022-04-09 MED ORDER — METOPROLOL SUCCINATE ER 25 MG PO TB24
100.0000 mg | ORAL_TABLET | Freq: Every day | ORAL | Status: DC
  Administered 2022-04-09: 100 mg via ORAL
  Filled 2022-04-09: qty 4

## 2022-04-09 MED ORDER — ENOXAPARIN SODIUM 40 MG/0.4ML IJ SOSY
40.0000 mg | PREFILLED_SYRINGE | INTRAMUSCULAR | Status: DC
  Filled 2022-04-09: qty 0.4

## 2022-04-09 MED ORDER — LOSARTAN POTASSIUM 50 MG PO TABS: 100.0000 mg | ORAL_TABLET | Freq: Every day | ORAL | Status: DC

## 2022-04-09 MED ORDER — ONDANSETRON HCL 4 MG/2ML IJ SOLN: 4.0000 mg | Freq: Four times a day (QID) | INTRAMUSCULAR | Status: DC | PRN

## 2022-04-09 MED ORDER — LOSARTAN POTASSIUM 50 MG PO TABS
150.0000 mg | ORAL_TABLET | Freq: Every day | ORAL | Status: DC
  Administered 2022-04-09: 150 mg via ORAL
  Filled 2022-04-09: qty 3

## 2022-04-09 MED ORDER — MAGNESIUM OXIDE -MG SUPPLEMENT 400 (240 MG) MG PO TABS
400.0000 mg | ORAL_TABLET | Freq: Every day | ORAL | Status: DC
  Administered 2022-04-09: 400 mg via ORAL
  Filled 2022-04-09: qty 1

## 2022-04-09 MED ORDER — ONDANSETRON HCL 4 MG PO TABS: 4.0000 mg | ORAL_TABLET | Freq: Four times a day (QID) | ORAL | Status: DC | PRN

## 2022-04-09 MED ORDER — ACETAMINOPHEN 650 MG RE SUPP: 650.0000 mg | Freq: Four times a day (QID) | RECTAL | Status: DC | PRN

## 2022-04-09 MED ORDER — MIRTAZAPINE 15 MG PO TABS: 15.0000 mg | ORAL_TABLET | Freq: Every day | ORAL | Status: DC

## 2022-04-09 MED ORDER — LABETALOL HCL 5 MG/ML IV SOLN: 5.0000 mg | INTRAVENOUS | Status: DC | PRN

## 2022-04-09 MED ORDER — ATORVASTATIN CALCIUM 10 MG PO TABS
20.0000 mg | ORAL_TABLET | Freq: Every day | ORAL | Status: DC
  Administered 2022-04-09: 20 mg via ORAL
  Filled 2022-04-09: qty 2

## 2022-04-09 MED ORDER — CLOPIDOGREL BISULFATE 75 MG PO TABS
75.0000 mg | ORAL_TABLET | Freq: Every day | ORAL | Status: DC
  Administered 2022-04-09: 75 mg via ORAL
  Filled 2022-04-09: qty 1

## 2022-04-09 MED ORDER — AMLODIPINE BESYLATE 5 MG PO TABS
10.0000 mg | ORAL_TABLET | Freq: Every day | ORAL | Status: DC
  Administered 2022-04-09: 10 mg via ORAL
  Filled 2022-04-09: qty 2

## 2022-04-09 MED ORDER — HYDRALAZINE HCL 20 MG/ML IJ SOLN: 10.0000 mg | Freq: Four times a day (QID) | INTRAMUSCULAR | Status: DC | PRN

## 2022-04-09 MED ORDER — ASPIRIN 81 MG PO CHEW
81.0000 mg | CHEWABLE_TABLET | Freq: Every day | ORAL | Status: DC
  Administered 2022-04-09: 81 mg via ORAL
  Filled 2022-04-09: qty 1

## 2022-04-09 MED ORDER — PANTOPRAZOLE SODIUM 40 MG PO TBEC
40.0000 mg | DELAYED_RELEASE_TABLET | Freq: Two times a day (BID) | ORAL | Status: DC
  Administered 2022-04-09: 40 mg via ORAL
  Filled 2022-04-09: qty 1

## 2022-04-09 MED ORDER — MONTELUKAST SODIUM 10 MG PO TABS
10.0000 mg | ORAL_TABLET | Freq: Every day | ORAL | Status: DC
  Administered 2022-04-09: 10 mg via ORAL
  Filled 2022-04-09: qty 1

## 2022-04-09 MED ORDER — NITROGLYCERIN 0.4 MG SL SUBL
0.4000 mg | SUBLINGUAL_TABLET | SUBLINGUAL | Status: DC | PRN
Start: 2022-04-09 — End: 2022-04-09

## 2022-04-09 MED ORDER — POLYSACCHARIDE-IRON COMPLEX 150 MG PO CAPS: 1.0000 | ORAL_CAPSULE | Freq: Every day | ORAL | Status: DC

## 2022-04-09 NOTE — ED Notes (Signed)
Patient assisted to use bedpan.

## 2022-04-09 NOTE — Assessment & Plan Note (Addendum)
Right carotid 60-79% stenosis.  Following with vasc surgery.  Sounds like they planning CEA at some point in near future. 1. Continue stroke prevention meds: 1. Statin 2. ASA 3. Plavix

## 2022-04-09 NOTE — Assessment & Plan Note (Signed)
Todays creat of 1.8 actually looks to be about her baseline over last few months: Was 1.9 on 03/14/22 Was 2.0 on 12/25/21

## 2022-04-09 NOTE — Discharge Summary (Signed)
[name] XBJ:[id number] DOB: [birth year] DOA: 04/08/2022  PCP: Garlan Fillers, MD  Admit date: 04/08/2022  Discharge date: 04/09/2022  Admitted From: Home   disposition: Home   Recommendations for Outpatient Follow-up:   Follow up with PCP in 1-2 weeks  PCP Please obtain BMP/CBC, 2 view CXR in 1week,  (see Discharge instructions)   PCP Please follow up on the following pending results: Check CT results need repeat CT chest in 3 weeks.  Check blood pressure, BMP in 2 to 3 days.   Home Health: None Equipment/Devices: None Consultations: None  Discharge Condition: Stable    CODE STATUS: Full    Diet Recommendation: Heart Healthy     Chief Complaint  Patient presents with   Weakness     Brief history of present illness from the day of admission and additional interim summary    [name] is a 76 y.o. 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   Pt presents to ED today with c/o generalized weakness over past couple of days mild nausea, in the ER she was found to have extremely elevated blood pressures consistently, MRI brain was unremarkable, CTA head and neck was nonacute except some nonspecific chest findings for which she rejects a repeat CT chest in 3 weeks by PCP.  She was admitted for hypertensive urgency.                                                                 Hospital Course   Hypertensive urgency with poorly controlled blood pressure due to dietary noncompliance, patient drinking large amounts of soda and eating burgers from cookout  then, states she drinks plenty of soda at home.  Blood pressure medications adjusted blood pressure gradually improving, she is completely symptom-free, offered her to stay another day for blood pressure monitoring but she is eager to go home and wants to follow-up with PCP.  Will add hydralazine every 8 hours to her home regimen with outpatient PCP follow-up, counseled on dietary discretion.  Note her generalized weakness was likely due to hypertensive urgency, MRI brain nonacute.  She worked with PT and currently symptom-free.  2.  Chronic combined systolic and diastolic CHF.  No acute issues.  Blood pressure medications and diuretics continued except hydralazine 100 every 8 added to the regimen.  Recent EF between 45 to 50%.  Clinically compensated.  3.  Specific CT chest findings on CTA neck.  PCP to repeat CT chest in 3 months.  4.  History of bilateral carotid artery occlusions chronic.  Continue  DAPT and statin, dietary compliance as above, follow-up with PCP and vascular surgery outpatient.  Patient is symptom-free.  5.  History of seizures.  On Keppra continue.  6.  Questionable UA.  No symptoms consistent of UTI, UA overall unremarkable.  PCP to monitor.  7.  CKD stage IV.  Baseline creatinine appears to be close to 1.8.  She is on diuretics along with ARB, PCP to monitor renal function closely.  Discharge diagnosis     Principal Problem:   Hypertensive urgency Active Problems:   Pyuria   CKD (chronic kidney disease) stage 4, GFR 15-29 ml/min (HCC)   Right-sided extracranial carotid artery stenosis   Left-sided extracranial carotid artery occlusion   History of seizures as a child   Chronic combined systolic and diastolic CHF (congestive heart failure) (HCC)    Discharge instructions    Discharge Instructions     Diet - low sodium heart healthy   Complete by: As directed    Discharge instructions   Complete by: As directed    Follow with Primary MD Garlan Fillers,  MD in 2 days   Get CBC, CMP  -  checked next visit within 2 days by Primary MD   Activity: As tolerated with Full fall precautions use walker/cane & assistance as needed  Disposition Home    Diet: Heart Healthy    Special Instructions: If you have smoked or chewed Tobacco  in the last 2 yrs please stop smoking, stop any regular Alcohol  and or any Recreational drug use.  On your next visit with your primary care physician please Get Medicines reviewed and adjusted.  Please request your Prim.MD to go over all Hospital Tests and Procedure/Radiological results at the follow up, please get all Hospital records sent to your Prim MD by signing hospital release before you go home.  If you experience worsening of your admission symptoms, develop shortness of breath, life threatening emergency, suicidal or homicidal thoughts you must seek medical attention immediately by calling 911 or calling your MD immediately  if symptoms less severe.  You Must read complete instructions/literature along with all the possible adverse reactions/side effects for all the Medicines you take and that have been prescribed to you. Take any new Medicines after you have completely understood and accpet all the possible adverse reactions/side effects.   Increase activity slowly   Complete by: As directed        Discharge Medications   Allergies as of 04/09/2022       Reactions   Adhesive [tape] Other (See Comments)   "Took off my skin"   Other Rash   "Took off my skin"   Ace Inhibitors Other (See Comments)   Unknown   Sulfa Antibiotics Other (See Comments)   Unknown   Topiramate Other (See Comments)   Codeine Other (See Comments)   Altered mental state    Iodinated Contrast Media Rash        Medication List     TAKE these medications    acetaminophen 325 MG tablet Commonly known as: TYLENOL Take 650 mg by mouth every 6 (six) hours as needed for mild pain.   albuterol 108 (90 Base) MCG/ACT  inhaler Commonly known as: VENTOLIN HFA Inhale 1-2 puffs into the lungs every 6 (six) hours as needed for wheezing or shortness of breath.   amLODipine 10 MG tablet Commonly known as: NORVASC Take 10 mg by mouth daily.   aspirin 81 MG chewable tablet Chew 1 tablet (81 mg  total) by mouth daily.   atorvastatin 20 MG tablet Commonly known as: LIPITOR Take 20 mg by mouth daily.   cholestyramine 4 g packet Commonly known as: QUESTRAN Take 0.5 packets by mouth in the morning and at bedtime.   clopidogrel 75 MG tablet Commonly known as: PLAVIX Take 75 mg by mouth daily.   [employer] Digestive Probiotic 250 MG capsule Generic drug: saccharomyces boulardii Take 500 mg by mouth daily.   fluticasone 50 MCG/ACT nasal spray Commonly known as: FLONASE Place 2 sprays into both nostrils daily as needed for allergies.   furosemide 20 MG tablet Commonly known as: LASIX Take 20 mg by mouth daily.   hydrALAZINE 100 MG tablet Commonly known as: APRESOLINE Take 1 tablet (100 mg total) by mouth every 8 (eight) hours.   HYDROcodone-acetaminophen 10-325 MG tablet Commonly known as: NORCO Take 1 tablet by mouth 4 (four) times daily as needed for moderate pain.   levETIRAcetam 500 MG tablet Commonly known as: KEPPRA TAKE 1 TABLET BY MOUTH TWICE A DAY   losartan 100 MG tablet Commonly known as: COZAAR Take 1 tablet (100 mg total) by mouth daily. What changed: additional instructions   losartan 50 MG tablet Commonly known as: COZAAR Take 50 mg by mouth daily. Total of 150 mg What changed: Another medication with the same name was changed. Make sure you understand how and when to take each.   magnesium oxide 400 MG tablet Commonly known as: MAG-OX Take 1 tablet by mouth daily.   metoprolol succinate 100 MG 24 hr tablet Commonly known as: TOPROL-XL Take 100 mg by mouth daily.   mirtazapine 15 MG tablet Commonly known as: REMERON Take 15 mg by mouth at bedtime.   montelukast 10 MG  tablet Commonly known as: SINGULAIR Take 10 mg by mouth daily.   nitroGLYCERIN 0.4 MG SL tablet Commonly known as: NITROSTAT Place 1 tablet (0.4 mg total) under the tongue every 5 (five) minutes as needed for chest pain.   ondansetron 4 MG tablet Commonly known as: ZOFRAN Take 1 tablet (4 mg total) by mouth every 6 (six) hours as needed for nausea or vomiting.   pantoprazole 40 MG tablet Commonly known as: PROTONIX Take 1 tablet (40 mg total) by mouth 2 (two) times daily.   Polysaccharide-Iron Complex 150 MG Caps Take 1 tablet by mouth daily.   triamterene-hydrochlorothiazide 37.5-25 MG tablet Commonly known as: MAXZIDE-25 Take 0.5 tablets by mouth every morning.   venlafaxine XR 75 MG 24 hr capsule Commonly known as: EFFEXOR-XR Take 1 capsule (75 mg total) by mouth daily.   vitamin B-12 500 MCG tablet Commonly known as: CYANOCOBALAMIN Take 500 mcg by mouth daily.   Vitamin D (Ergocalciferol) 1.25 MG (50000 UNIT) Caps capsule Commonly known as: DRISDOL Take 50,000 Units by mouth every Wednesday.   Zenpep 40000-126000 units Cpep Generic drug: Pancrelipase (Lip-Prot-Amyl) Take 2 capsules with meals and one capsule with snacks. What changed:  how much to take how to take this when to take this additional instructions         Follow-up Information     Garlan Fillers, MD Follow up in 2 day(s).   Specialty: Internal Medicine Contact information: [address] [place] [address] [phone]         [place], Peter M, MD. Schedule an appointment as soon as possible for a visit in 1 week(s).   Specialty: Cardiology Contact information: [address] [place] [place] [address] [phone]  Major procedures and Radiology Reports - PLEASE review detailed and final reports thoroughly  -        MR BRAIN WO CONTRAST  Result Date: 04/09/2022 CLINICAL DATA:  Initial evaluation for neuro deficit, stroke suspected. EXAM:  MRI HEAD WITHOUT CONTRAST TECHNIQUE: Multiplanar, multiecho pulse sequences of the brain and surrounding structures were obtained without intravenous contrast. COMPARISON:  Prior study from 03/14/2022. FINDINGS: Brain: Examination degraded by motion. Age-related cerebral atrophy with moderate chronic small vessel ischemic disease. No acute or subacute infarct. No areas of chronic cortical infarction. No acute or chronic intracranial blood products. No mass lesion, midline shift or mass effect. No hydrocephalus or extra-axial fluid collection. Pituitary gland suprasellar region within normal limits. Vascular: Chronic left ICA occlusion noted. Major intracranial vascular flow voids are otherwise maintained. Skull and upper cervical spine: Craniocervical junctional limits. Bone marrow signal intensity normal. Scalp soft tissues demonstrate no acute finding. Focal soft tissue thinning at the left frontal scalp noted. Sinuses/Orbits: Prior bilateral ocular lens replacement. Scattered mucosal thickening noted within the ethmoidal air cells. No significant mastoid effusion. Other: None. IMPRESSION: 1. No acute intracranial abnormality. 2. Age-related cerebral atrophy with moderate chronic small vessel ischemic disease. 3. Chronic left ICA occlusion. Electronically Signed   By: Rise Mu M.D.   On: 04/09/2022 00:43   DG Chest 1 View  Result Date: 04/08/2022 CLINICAL DATA:  Shortness of breath, cough EXAM: CHEST  1 VIEW COMPARISON:  12/25/2021 FINDINGS: Heart and mediastinal contours are within normal limits. No focal opacities or effusions. No acute bony abnormality. Aortic atherosclerosis. IMPRESSION: No active disease. Electronically Signed   By: Charlett Nose M.D.   On: 04/08/2022 20:20   CT ANGIO NECK W OR WO CONTRAST  Result Date: 03/15/2022 CLINICAL DATA:  Carotid artery stenosis EXAM: CT ANGIOGRAPHY HEAD AND NECK TECHNIQUE: Multidetector CT imaging of the head and neck was performed using the  standard protocol during bolus administration of intravenous contrast. Multiplanar CT image reconstructions and MIPs were obtained to evaluate the vascular anatomy. Carotid stenosis measurements (when applicable) are obtained utilizing NASCET criteria, using the distal internal carotid diameter as the denominator. RADIATION DOSE REDUCTION: This exam was performed according to the departmental dose-optimization program which includes automated exposure control, adjustment of the mA and/or kV according to patient size and/or use of iterative reconstruction technique. CONTRAST:  50mL OMNIPAQUE IOHEXOL 350 MG/ML SOLN COMPARISON:  MRI/MRA head December 26, 2021. FINDINGS: CT HEAD FINDINGS Brain: No evidence of acute infarction, hemorrhage, hydrocephalus, extra-axial collection or mass lesion/mass effect. Chronic microvascular ischemic disease. Vascular: Detailed below. Skull: No acute fracture. Sinuses: Clear sinuses. Orbits: No acute finding. Review of the MIP images confirms the above findings CTA NECK FINDINGS Aortic arch: Atherosclerosis of the aorta. Approximately 40% stenosis of the left subclavian artery origin. Right carotid system: Atherosclerosis at the carotid bifurcation with approximately 70-80% stenosis of the proximal ICA. Left carotid system: Severe stenosis of the common carotid artery origin and or distal common carotid artery due to mixed atherosclerosis. Occlusion of the left ICA with non opacification in the neck. Vertebral arteries: Approximately 50% stenosis of the right vertebral artery origin. Otherwise, vertebral arteries are patent without significant greater than 50%) stenosis Skeleton: Moderate multilevel degenerative change. Other neck: Multiple thyroid nodules, which were further evaluated on prior ultrasound. (Ref: J Am Coll Radiol. 2015 Feb;12(2): 143-50). Upper chest: Patchy ground-glass and consolidative opacities in the imaged right upper lung. Emphysema. Review of the MIP images confirms  the above findings CTA HEAD FINDINGS  Anterior circulation: Essentially Non-opacified left intracranial ICA. Minimal retrograde opacification of the paraclinoid left ICA. Calcific atherosclerosis of the right intracranial ICA with mild narrowing. Bilateral MCAs and ACAs are patent without proximal hemodynamically significant stenosis. Small left A1 ACA, probably congenital. Posterior circulation: Bilateral intradural vertebral arteries, basilar artery, and posterior cerebral arteries are patent without proximal hemodynamically significant stenosis. Prominent left posterior communicating artery, anatomic variant. Venous sinuses: As permitted by contrast timing, patent. Anatomic variants: Detailed above. Review of the MIP images confirms the above findings IMPRESSION: 1. Chronic occlusion of the left ICA in the neck with non-opacification in the neck and intracranially. Left MCA and ACA are patent. 2. Approximately 70-80% stenosis of the right proximal ICA. 3. Approximately 50% stenosis of the right vertebral artery origin. 4. Patchy ground-glass and consolidative opacities in the imaged right upper lung, which could represent aspiration and/or pneumonia. Follow-up CT of the chest in approximately 3 weeks is recommended to better assess, ensure improvement after treatment, and exclude malignancy. 5. Aortic Atherosclerosis (ICD10-I70.0) and Emphysema (ICD10-J43.9). Electronically Signed   By: Feliberto Harts M.D.   On: 03/15/2022 12:29   CT ANGIO HEAD W OR WO CONTRAST  Result Date: 03/15/2022 CLINICAL DATA:  Carotid artery stenosis EXAM: CT ANGIOGRAPHY HEAD AND NECK TECHNIQUE: Multidetector CT imaging of the head and neck was performed using the standard protocol during bolus administration of intravenous contrast. Multiplanar CT image reconstructions and MIPs were obtained to evaluate the vascular anatomy. Carotid stenosis measurements (when applicable) are obtained utilizing NASCET criteria, using the distal  internal carotid diameter as the denominator. RADIATION DOSE REDUCTION: This exam was performed according to the departmental dose-optimization program which includes automated exposure control, adjustment of the mA and/or kV according to patient size and/or use of iterative reconstruction technique. CONTRAST:  50mL OMNIPAQUE IOHEXOL 350 MG/ML SOLN COMPARISON:  MRI/MRA head December 26, 2021. FINDINGS: CT HEAD FINDINGS Brain: No evidence of acute infarction, hemorrhage, hydrocephalus, extra-axial collection or mass lesion/mass effect. Chronic microvascular ischemic disease. Vascular: Detailed below. Skull: No acute fracture. Sinuses: Clear sinuses. Orbits: No acute finding. Review of the MIP images confirms the above findings CTA NECK FINDINGS Aortic arch: Atherosclerosis of the aorta. Approximately 40% stenosis of the left subclavian artery origin. Right carotid system: Atherosclerosis at the carotid bifurcation with approximately 70-80% stenosis of the proximal ICA. Left carotid system: Severe stenosis of the common carotid artery origin and or distal common carotid artery due to mixed atherosclerosis. Occlusion of the left ICA with non opacification in the neck. Vertebral arteries: Approximately 50% stenosis of the right vertebral artery origin. Otherwise, vertebral arteries are patent without significant greater than 50%) stenosis Skeleton: Moderate multilevel degenerative change. Other neck: Multiple thyroid nodules, which were further evaluated on prior ultrasound. (Ref: J Am Coll Radiol. 2015 Feb;12(2): 143-50). Upper chest: Patchy ground-glass and consolidative opacities in the imaged right upper lung. Emphysema. Review of the MIP images confirms the above findings CTA HEAD FINDINGS Anterior circulation: Essentially Non-opacified left intracranial ICA. Minimal retrograde opacification of the paraclinoid left ICA. Calcific atherosclerosis of the right intracranial ICA with mild narrowing. Bilateral MCAs and ACAs  are patent without proximal hemodynamically significant stenosis. Small left A1 ACA, probably congenital. Posterior circulation: Bilateral intradural vertebral arteries, basilar artery, and posterior cerebral arteries are patent without proximal hemodynamically significant stenosis. Prominent left posterior communicating artery, anatomic variant. Venous sinuses: As permitted by contrast timing, patent. Anatomic variants: Detailed above. Review of the MIP images confirms the above findings IMPRESSION: 1. Chronic occlusion of the  left ICA in the neck with non-opacification in the neck and intracranially. Left MCA and ACA are patent. 2. Approximately 70-80% stenosis of the right proximal ICA. 3. Approximately 50% stenosis of the right vertebral artery origin. 4. Patchy ground-glass and consolidative opacities in the imaged right upper lung, which could represent aspiration and/or pneumonia. Follow-up CT of the chest in approximately 3 weeks is recommended to better assess, ensure improvement after treatment, and exclude malignancy. 5. Aortic Atherosclerosis (ICD10-I70.0) and Emphysema (ICD10-J43.9). Electronically Signed   By: Feliberto Harts M.D.   On: 03/15/2022 12:29     day   Subjective    [name] today has no headache,no chest abdominal pain,no new weakness tingling or numbness, feels much better wants to go home today.     Objective   Blood pressure (!) 164/75, pulse 90 temperature 97.8 F (36.6 C), temperature source Oral, resp. rate 17, height 5\' 3"  (1.6 m), weight 77.1 kg, SpO2 100 %.  No intake or output data in the 24 hours ending 04/09/22 1000  Exam  Awake Alert, No new F.N deficits,    [place].AT,PERRAL Supple Neck,   Symmetrical Chest wall movement, Good air movement bilaterally, CTAB RRR,No Gallops,   +ve B.Sounds, Abd Soft, Non tender,  No Cyanosis, Clubbing or edema    Data Review   Recent Labs  Lab 04/08/22 1709 04/09/22 0344  WBC 11.8* 9.2  HGB 11.9* 11.5*  HCT  38.7 35.0*  PLT 247 213  MCV 94.4 93.1  MCH 29.0 30.6  MCHC 30.7 32.9  RDW 12.9 12.9  LYMPHSABS 2.6  --   MONOABS 0.8  --   EOSABS 0.5  --   BASOSABS 0.1  --     Recent Labs  Lab 04/08/22 1709 04/09/22 0344  NA 141 141  K 4.5 3.8  CL 109 111  CO2 21* 18*  GLUCOSE 103* 201*  BUN 20 18  CREATININE 1.82* 1.75*  CALCIUM 8.7* 8.5*  AST 21  --   ALT 16  --   ALKPHOS 87  --   BILITOT 0.5  --   ALBUMIN 3.3*  --     Total Time in preparing paper work, data evaluation and todays exam - 35 minutes  Susa Raring M.D on 04/09/2022 at 10:00 AM  [facility]

## 2022-04-09 NOTE — Assessment & Plan Note (Signed)
On chronic keppra. No seizures since child though per patient. Continue keppra.

## 2022-04-10 LAB — URINE CULTURE: Culture: >=100000 — AB

## 2022-04-11 ENCOUNTER — Telehealth: Payer: Self-pay

## 2022-04-11 ENCOUNTER — Ambulatory Visit: Payer: 59 | Admitting: Family Medicine

## 2022-04-11 NOTE — Telephone Encounter (Signed)
Post ED Visit - Positive Culture Follow-up  Culture report reviewed by antimicrobial stewardship pharmacist: [place] Team '[x]'$  Luisa Hart, [place].D. '[]'$  Heide Guile, Pharm.D., BCPS AQ-ID '[]'$  Parks Neptune, Pharm.D., BCPS '[]'$  Alycia Rossetti, Pharm.D., BCPS '[]'$  [place], [place].D., BCPS, AAHIVP '[]'$  Legrand Como, Pharm.D., BCPS, AAHIVP '[]'$  Salome Arnt, PharmD, BCPS '[]'$  Johnnette Gourd, PharmD, BCPS '[]'$  Hughes Better, PharmD, BCPS '[]'$  Leeroy Cha, PharmD '[]'$  Laqueta Linden, PharmD, BCPS '[]'$  Albertina Parr, PharmD  [place] Team '[]'$  Leodis Sias, PharmD '[]'$  Lindell Spar, PharmD '[]'$  Royetta Asal, PharmD '[]'$  Graylin Shiver, Rph '[]'$  Rema Fendt) Glennon Mac, PharmD '[]'$  Arlyn Dunning, PharmD '[]'$  Netta Cedars, PharmD '[]'$  Dia Sitter, PharmD '[]'$  Leone Haven, PharmD '[]'$  Gretta Arab, PharmD '[]'$  Theodis Shove, PharmD '[]'$  Peggyann Juba, PharmD '[]'$  Reuel Boom, PharmD   Positive urine culture Not treated, asymptomatic bacteria, no treatment needed. No further patient follow-up is required at this time.  Glennon Hamilton 04/11/2022, 6:24 PM

## 2022-04-11 NOTE — Progress Notes (Signed)
ED Antimicrobial Stewardship Positive Culture Follow Up   [name] is an 77 y.o. female who presented to [facility] on 04/08/2022 with a chief complaint of  Chief Complaint  Patient presents with   Weakness    Recent Results (from the past 720 hour(s))  Urine Culture     Status: Abnormal   Collection Time: 04/08/22 10:59 PM   Specimen: Urine, Clean Catch  Result Value Ref Range Status   Specimen Description URINE, CLEAN CATCH  Final   Special Requests   Final    NONE Performed at [facility], [address], [place], [place] [address]    Culture >=100,000 COLONIES/mL ESCHERICHIA COLI (A)  Final   Report Status 04/10/2022 FINAL  Final   Organism ID, Bacteria ESCHERICHIA COLI (A)  Final      Susceptibility   Escherichia coli - MIC*    AMPICILLIN <=2 SENSITIVE Sensitive     CEFAZOLIN <=4 SENSITIVE Sensitive     CEFEPIME <=0.12 SENSITIVE Sensitive     CEFTRIAXONE <=0.25 SENSITIVE Sensitive     CIPROFLOXACIN <=0.25 SENSITIVE Sensitive     GENTAMICIN <=1 SENSITIVE Sensitive     IMIPENEM <=0.25 SENSITIVE Sensitive     NITROFURANTOIN <=16 SENSITIVE Sensitive     TRIMETH/SULFA <=20 SENSITIVE Sensitive     AMPICILLIN/SULBACTAM <=2 SENSITIVE Sensitive     PIP/TAZO <=4 SENSITIVE Sensitive     * >=100,000 COLONIES/mL ESCHERICHIA COLI   Patient admitted under observation status to [facility] and Dr. Lala Lund discharged originally without antimicrobial agent and no further treatment is indicated due to diagnosis of asymptomatic bacteruria.    Kaleen Mask 04/11/2022, 9:21 AM Clinical Pharmacist Monday - Friday phone -  [phone] Saturday - Sunday phone - [phone]

## 2022-04-11 NOTE — Telephone Encounter (Signed)
Pt called saying that she went to the ED this week and they wanted her to get scheduled with vascular surgery asap.  Reviewed pt's chart, returned pt's call, two identifiers used. Pt stated that she needed surgery. Informed pt that she was scheduled to see Dr Virl Cagey tomorrow, 6/30, via phone visit and he would discuss the next steps. Pt confirmed understanding.

## 2022-04-12 ENCOUNTER — Ambulatory Visit (INDEPENDENT_AMBULATORY_CARE_PROVIDER_SITE_OTHER): Payer: Medicare HMO | Admitting: Vascular Surgery

## 2022-04-12 DIAGNOSIS — I6521 Occlusion and stenosis of right carotid artery: Secondary | ICD-10-CM

## 2022-04-12 NOTE — Progress Notes (Signed)
Phone Note   HPI: [name] is a 76 y.o. (11/04/1945) female presenting in follow up with history of of left SFA stenting in May 2011.  She has a known left ICA occlusion, with questionable symptomatic right ICA stenosis.  At her last visit in the office, we discussed transcarotid artery revascularization.  She had questions regarding the procedure, and after not being able to reach her by phone, I elected to set an appointment.  I called Cydni this afternoon to go over both her CT angiography as well as carotid duplex ultrasound.  CTA demonstrates severe stenosis greater than 80%, with carotid duplex demonstrating 60 to 79%.    On the call, Amoria that she was feeling better than yesterday.  She was recently discharged to the hospital after hypertensive urgency episode.  She denied recent episode of TIA, stroke, amaurosis.  She questions whether that hypertensive episode is related to the carotid.    The pt is  on a statin for cholesterol management.  The pt is  on a daily aspirin.   Other AC:  plavix The pt is  on medication for hypertension.   The pt is not diabetic.  Tobacco hx:  -  Past Medical History:  Diagnosis Date   Allergy    Anemia    past hx per pt    Anxiety    Arthritis    back    Arthropathy, unspecified, site unspecified    Blind loop syndrome    Carotid artery disease ([place])    documented occlusion of left ICA   Cataract    removed both eyes    Clotting disorder (H[facility])    Hx Clot in leg per pt    Colostomy present ([place])    Coronary artery disease    a. known 60-70% mid-LAD stenosis with caths in 1999, 2002, 2006, and 2012 showing stable anatomy b. 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[place])    Diabetes mellitus    Diverticulosis    Dizziness    chronic   Dyslipidemia    Dyspnea    with exertion   Fatty liver    GERD (gastroesophageal reflux disease)    hiatial hernia   Heart murmur    Hepatomegaly    Hiatal hernia    HTN  (hypertension)    Hyperlipidemia    Hypertension    Irritable bowel syndrome    Melanosis coli    Metatarsal fracture right   Normal nuclear stress test    nml 01/03/10;06/19/07   Other, mixed, or unspecified nondependent drug abuse, unspecified    Pancreatitis, chronic ([place])    Peripheral vascular disease ([place]) 02/20/10   s/p stent of left SFA   Seizure ([place]) 11/04/2018   last 1 yr 2020 per pt unsure month    Sleep apnea    no cpap    Stroke ([place])    Thyroid nodule    Unspecified essential hypertension    Vitamin B12 deficiency     Past Surgical History:  Procedure Laterality Date   ABDOMINAL HYSTERECTOMY     partial   AUGMENTATION MAMMAPLASTY     bilateral 1976 retro    BREAST ENHANCEMENT SURGERY     CARDIAC CATHETERIZATION  07/07/98;08/31/01;03/04/05   60 -70% LAD   CATARACT EXTRACTION Bilateral    COLON RESECTION N/A 10/14/2017   Procedure: EXPLORATORY LAPAROTOMY, EXTENDED RIGHT COLECTOMY; APPLICATION OF ABDOMINAL VACUUM DRESSING;  Surgeon: Jovita Kussmaul, MD;  Location: [facility];  Service: General;  Laterality: N/A;   COLONOSCOPY     ENDOBRONCHIAL ULTRASOUND Bilateral 02/24/2017   Procedure: ENDOBRONCHIAL ULTRASOUND;  Surgeon: Collene Gobble, MD;  Location: [facility];  Service: Cardiopulmonary;  Laterality: Bilateral;   FEMORAL ARTERY STENT     Left leg   LAPAROTOMY N/A 10/16/2017   Procedure: EXPLORATORY LAPAROTOMY, PARTIAL OMENTECTOMY, RESECTION ISCHEMIC ILEUM 245YK, APPLICATION OF VAC ABDOMINAL DRESSING;  Surgeon: Armandina Gemma, MD;  Location: [facility];  Service: General;  Laterality: N/A;   LAPAROTOMY N/A 10/18/2017   Procedure: RE-EXPLORATION OF ABDOMEN, ILEOSTOMY CREATION;  Surgeon: Stark Klein, MD;  Location: [facility];  Service: General;  Laterality: N/A;   LUNG SURGERY  03/2017   Benign polyps removed   PATELLA REALIGNMENT Left    POLYPECTOMY     SIGMOIDOSCOPY     TONSILLECTOMY      Social History   Socioeconomic History   Marital status: Divorced     Spouse name: Not on file   Number of children: 2   Years of education: Not on file   Highest education level: Not on file  Occupational History   Occupation: retired    Fish farm manager: DISABLED  Tobacco Use   Smoking status: Former    Packs/day: 1.00    Years: 50.00    Total pack years: 50.00    Types: Cigarettes    Quit date: 10/14/2010    Years since quitting: 11.5   Smokeless tobacco: Never  Vaping Use   Vaping Use: Never used  Substance and Sexual Activity   Alcohol use: No   Drug use: No   Sexual activity: Not on file  Other Topics Concern   Not on file  Social History Narrative   Not on file   Social Determinants of Health   Financial Resource Strain: Not on file  Food Insecurity: Not on file  Transportation Needs: Not on file  Physical Activity: Not on file  Stress: Not on file  Social Connections: Not on file  Intimate Partner Violence: Not on file    Family History  Problem Relation Age of Onset   Hypertension Mother    Osteoarthritis Mother    Pulmonary embolism Mother    Hypertension Father        aneurysm   Stroke Father    Colon cancer Paternal Grandfather    Stroke Other    Breast cancer Neg Hx    Colon polyps Neg Hx    Esophageal cancer Neg Hx    Rectal cancer Neg Hx    Stomach cancer Neg Hx    Pancreatic cancer Neg Hx    Liver disease Neg Hx     Current Outpatient Medications  Medication Sig Dispense Refill   acetaminophen (TYLENOL) 325 MG tablet Take 650 mg by mouth every 6 (six) hours as needed for mild pain.     albuterol (VENTOLIN HFA) 108 (90 Base) MCG/ACT inhaler Inhale 1-2 puffs into the lungs every 6 (six) hours as needed for wheezing or shortness of breath.     amLODipine (NORVASC) 10 MG tablet Take 10 mg by mouth daily.     aspirin 81 MG chewable tablet Chew 1 tablet (81 mg total) by mouth daily.     atorvastatin (LIPITOR) 20 MG tablet Take 20 mg by mouth daily.     cholestyramine (QUESTRAN) 4 g packet Take 0.5 packets by mouth in the  morning and at bedtime.     clopidogrel (PLAVIX) 75 MG tablet Take 75 mg by mouth daily.  [employer] DIGESTIVE PROBIOTIC 250 MG capsule Take 500 mg by mouth daily.     fluticasone (FLONASE) 50 MCG/ACT nasal spray Place 2 sprays into both nostrils daily as needed for allergies.     furosemide (LASIX) 20 MG tablet Take 20 mg by mouth daily.     hydrALAZINE (APRESOLINE) 100 MG tablet Take 1 tablet (100 mg total) by mouth every 8 (eight) hours. 90 tablet 0   HYDROcodone-acetaminophen (NORCO) 10-325 MG tablet Take 1 tablet by mouth 4 (four) times daily as needed for moderate pain.     levETIRAcetam (KEPPRA) 500 MG tablet TAKE 1 TABLET BY MOUTH TWICE A DAY (Patient taking differently: Take 500 mg by mouth 2 (two) times daily.) 180 tablet 2   losartan (COZAAR) 100 MG tablet Take 1 tablet (100 mg total) by mouth daily. (Patient taking differently: Take 100 mg by mouth daily. Total of '150mg'$ ) 45 tablet 8   losartan (COZAAR) 50 MG tablet Take 50 mg by mouth daily. Total of 150 mg     magnesium oxide (MAG-OX) 400 MG tablet Take 1 tablet by mouth daily.     metoprolol succinate (TOPROL-XL) 100 MG 24 hr tablet Take 100 mg by mouth daily.     mirtazapine (REMERON) 15 MG tablet Take 15 mg by mouth at bedtime.     montelukast (SINGULAIR) 10 MG tablet Take 10 mg by mouth daily.     nitroGLYCERIN (NITROSTAT) 0.4 MG SL tablet Place 1 tablet (0.4 mg total) under the tongue every 5 (five) minutes as needed for chest pain. 90 tablet 3   ondansetron (ZOFRAN) 4 MG tablet Take 1 tablet (4 mg total) by mouth every 6 (six) hours as needed for nausea or vomiting. 40 tablet 1   Pancrelipase, Lip-Prot-Amyl, (ZENPEP) 40000-126000 units CPEP Take 2 capsules with meals and one capsule with snacks. (Patient taking differently: Take 1 capsule by mouth daily with breakfast.) 240 capsule 0   pantoprazole (PROTONIX) 40 MG tablet Take 1 tablet (40 mg total) by mouth 2 (two) times daily. 180 tablet 2   Polysaccharide-Iron Complex 150 MG CAPS  Take 1 tablet by mouth daily.     triamterene-hydrochlorothiazide (MAXZIDE-25) 37.5-25 MG tablet Take 0.5 tablets by mouth every morning.     venlafaxine XR (EFFEXOR-XR) 75 MG 24 hr capsule Take 1 capsule (75 mg total) by mouth daily.     vitamin B-12 (CYANOCOBALAMIN) 500 MCG tablet Take 500 mcg by mouth daily.     Vitamin D, Ergocalciferol, (DRISDOL) 1.25 MG (50000 UT) CAPS capsule Take 50,000 Units by mouth every Wednesday.      No current facility-administered medications for this visit.    Allergies  Allergen Reactions   Adhesive [Tape] Other (See Comments)    "Took off my skin"   Other Rash    "Took off my skin"   Ace Inhibitors Other (See Comments)    Unknown   Sulfa Antibiotics Other (See Comments)    Unknown   Topiramate Other (See Comments)   Codeine Other (See Comments)    Altered mental state    Iodinated Contrast Media Rash     REVIEW OF SYSTEMS:   '[X]'$  denotes positive finding, '[ ]'$  denotes negative finding Cardiac  Comments:  Chest pain or chest pressure:    Shortness of breath upon exertion:    Short of breath when lying flat:    Irregular heart rhythm:        Vascular    Pain in calf, thigh, or hip brought on by  ambulation:    Pain in feet at night that wakes you up from your sleep:     Blood clot in your veins:    Leg swelling:         Pulmonary    Oxygen at home:    Productive cough:     Wheezing:         Neurologic    Sudden weakness in arms or legs:     Sudden numbness in arms or legs:     Sudden onset of difficulty speaking or slurred speech:    Temporary loss of vision in one eye:     Problems with dizziness:         Gastrointestinal    Blood in stool:     Vomited blood:         Genitourinary    Burning when urinating:     Blood in urine:        Psychiatric    Major depression:         Hematologic    Bleeding problems:    Problems with blood clotting too easily:        Skin    Rashes or ulcers:        Constitutional    Fever  or chills:      PHYSICAL EXAMINATION:  There were no vitals filed for this visit.  General:  WDWN in NAD; vital signs documented above Gait: Not observed HENT: WNL, normocephalic, slurred speech from tongue deviation due to previous ET tube Pulmonary: normal non-labored breathing , without wheezing Cardiac: regular HR, Abdomen: soft, NT, no masses Skin: without rashes Vascular Exam/Pulses:  Right Left  Radial 2+ (normal) 2+ (normal)  Ulnar 2+ (normal) 2+ (normal)  Femoral    Popliteal    DP 2+ (normal) 2+ (normal)       Extremities: without ischemic changes, without Gangrene , without cellulitis; without open wounds; leg swelling improved dramatically Musculoskeletal: no muscle wasting or atrophy  Neurologic: A&O X 3;  No focal weakness or paresthesias are detected Psychiatric:  The pt has Normal affect.   Non-Invasive Vascular Imaging:    Summary:  Right Carotid: Velocities in the right ICA are consistent with a 60-79%                 stenosis.  Left Carotid: Evidence consistent with a total occlusion of the left ICA.  Vertebrals: Bilateral vertebral arteries demonstrate antegrade flow.   Right carotid system: Atherosclerosis at the carotid bifurcation with approximately 70-80% stenosis of the proximal ICA.  ASSESSMENT/PLAN: JAMAIRA SHERK is a 76 y.o. female presenting in follow up with history of of left SFA stenting in May 2011.  She has a known left ICA occlusion, with questionable symptomatic right ICA stenosis. Being that the lesion in question may be symptomatic, we discussed the role of transcarotid artery revascularization   Imaging demonstrates stenosis greater than 60%.  Being that there is concern for symptomatic disease, the patient has an occluded contralateral internal carotid artery, Vaughan Basta and I discussed revascularization.  She is very concerned about stroke and would like to proceed in an effort to reduce her risk.    Britany and I discussed transcarotid  artery revascularization, and the risks and benefits associated with this procedure.  We also discussed carotid endarterectomy.  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  I think TCAR has a lower risk profile.  After discussing the risk and benefits, she elected to  proceed.  I asked that she continue her current medication regimen.   [name], MD [facility] [phone]

## 2022-04-14 NOTE — Progress Notes (Deleted)
Cardiology Clinic Note   Patient Name: [name] Date of Encounter: 04/14/2022  Primary Care Provider:  Donnajean Lopes, MD Primary Cardiologist:  Peter Martinique, MD  Patient Profile    [name] 76 year old female presents the clinic today for follow-up evaluation of her coronary artery disease.  Past Medical History    Past Medical History:  Diagnosis Date   Allergy    Anemia    past hx per pt    Anxiety    Arthritis    back    Arthropathy, unspecified, site unspecified    Blind loop syndrome    Carotid artery disease ([place])    documented occlusion of left ICA   Cataract    removed both eyes    Clotting disorder (HCC)    Hx Clot in leg per pt    Colostomy present ([place])    Coronary artery disease    a. known 60-70% mid-LAD stenosis with caths in 1999, 2002, 2006, and 2012 showing stable anatomy b. 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[place])    Diabetes mellitus    Diverticulosis    Dizziness    chronic   Dyslipidemia    Dyspnea    with exertion   Fatty liver    GERD (gastroesophageal reflux disease)    hiatial hernia   Heart murmur    Hepatomegaly    Hiatal hernia    HTN (hypertension)    Hyperlipidemia    Hypertension    Irritable bowel syndrome    Melanosis coli    Metatarsal fracture right   Normal nuclear stress test    nml 01/03/10;06/19/07   Other, mixed, or unspecified nondependent drug abuse, unspecified    Pancreatitis, chronic ([place])    Peripheral vascular disease ([place]) 02/20/10   s/p stent of left SFA   Seizure ([place]) 11/04/2018   last 1 yr 2020 per pt unsure month    Sleep apnea    no cpap    Stroke ([place])    Thyroid nodule    Unspecified essential hypertension    Vitamin B12 deficiency    Past Surgical History:  Procedure Laterality Date   ABDOMINAL HYSTERECTOMY     partial   AUGMENTATION MAMMAPLASTY     bilateral 1976 retro    BREAST ENHANCEMENT SURGERY     CARDIAC CATHETERIZATION   07/07/98;08/31/01;03/04/05   60 -70% LAD   CATARACT EXTRACTION Bilateral    COLON RESECTION N/A 10/14/2017   Procedure: EXPLORATORY LAPAROTOMY, EXTENDED RIGHT COLECTOMY; APPLICATION OF ABDOMINAL VACUUM DRESSING;  Surgeon: Jovita Kussmaul, MD;  Location: [facility];  Service: General;  Laterality: N/A;   COLONOSCOPY     ENDOBRONCHIAL ULTRASOUND Bilateral 02/24/2017   Procedure: ENDOBRONCHIAL ULTRASOUND;  Surgeon: Collene Gobble, MD;  Location: [facility];  Service: Cardiopulmonary;  Laterality: Bilateral;   FEMORAL ARTERY STENT     Left leg   LAPAROTOMY N/A 10/16/2017   Procedure: EXPLORATORY LAPAROTOMY, PARTIAL OMENTECTOMY, RESECTION ISCHEMIC ILEUM 564PP, APPLICATION OF VAC ABDOMINAL DRESSING;  Surgeon: Armandina Gemma, MD;  Location: [facility];  Service: General;  Laterality: N/A;   LAPAROTOMY N/A 10/18/2017   Procedure: RE-EXPLORATION OF ABDOMEN, ILEOSTOMY CREATION;  Surgeon: Stark Klein, MD;  Location: [facility];  Service: General;  Laterality: N/A;   LUNG SURGERY  03/2017   Benign polyps removed   PATELLA REALIGNMENT Left    POLYPECTOMY     SIGMOIDOSCOPY     TONSILLECTOMY      Allergies  Allergies  Allergen Reactions   Adhesive [Tape] Other (See Comments)    "Took off my skin"   Other Rash    "Took off my skin"   Ace Inhibitors Other (See Comments)    Unknown   Sulfa Antibiotics Other (See Comments)    Unknown   Topiramate Other (See Comments)   Codeine Other (See Comments)    Altered mental state    Iodinated Contrast Media Rash    History of Present Illness    [name] has a PMH of carotid artery disease.  She has known occlusions of her  left ICA.  PMH also includes coronary artery disease type 2 diabetes, diabetes insipidus, dizziness, hypertension, GERD, chronic pancreatitis, CVA, and PAD stent to her left SFA.  She underwent cardiac catheterization in 1999, 2002, 2016, 2012.  She has known 60-70% stenosis of her mid LAD.  Her 2012 cardiac catheterization showed stable  anatomy.  Her carotid artery disease is followed by vein vascular surgery.  Lower extremity Dopplers 2/18 showed normal flow in the right, mild occlusive disease on the left.  Her carotid Dopplers 11/26/2016 showed greater than 50% left mid common carotid artery stenosis with known left internal carotid occlusion, right internal carotid artery suggestive of 40- 59% stenosis.  She underwent nuclear stress test 3/20 which showed normal perfusion with an EF of 45-50%.  She was admitted 11/20 with AKI due to high output ileostomy and ARB.  January 2021 she was admitted with acute respiratory failure and encephalopathy related to COVID-19 pneumonia.  She was seen by Dr. Martinique on 05/30/2020.  During that time she reported that she would get tired easily and was unable to walk very far before giving out.  She denied chest pain.  She reported back problems and also noted occasional frontal type headaches.  She presented to the clinic 02/19/21 for follow-up evaluation stated she felt well.  She reported that she was staying somewhat physically active doing housework.  She reported that she was limited in her physical activity due to her chronic back pain.  She reported following a strict diet but reported she was limited in food she can eat due to her ostomy.  She reported that she would like to have her ostomy reversed and presented today stating that she  had a consultation with a physician in [place].  She did not know the name of the practice or the physician.  I have asked her to have the office send [place] a preoperative cardiac clearance request.  I planned follow-up with Dr. Martinique in 6 months.  She presented to the emergency department on 04/15/2022 with suspected stroke at near 938.  She has been seen at her PCPs office.  She was noted to have slurred speech and confusion.  She complained of right hand tingling and weakness.  She presents to the clinic today for follow up and states***  Today she denies chest  pain, shortness of breath, increased lower extremity edema, fatigue, palpitations, melena, hematuria, hemoptysis, diaphoresis, weakness, presyncope, syncope, orthopnea, and PND.  Coronary artery disease- no resent episodes of chest discomfort.   No recent episodes of chest discomfort, arm, or neck pain.  Nuclear stress test 2020 and EF of 48% and no reversible ischemia.  She has known 60 to 70% LAD stenosis on multiple previous LHC. Continue amlodipine, metoprolol, nitroglycerin, Plavix, atorvastatin Heart healthy low-sodium diet Maintain physical activity  Essential hypertension-BP today ***126/64.  Well-controlled at home. Continue amlodipine, metoprolol Heart healthy low-sodium diet  Maintain physical activity  Bilateral carotid artery disease- carotid Dopplers 7/21 showed 40--59% right ICA, and total occlusion of left carotid artery Continue atorvastatin Heart healthy low-sodium high-fiber diet  Peripheral arterial disease- denies claudication Continue atorvastatin Heart healthy low-sodium high-fiber diet Maintain physical activity Follows with pain vascular surgery  OSA- compliant with CPAP. Waking up well rested.  Continue CPAP use  Hyperlipidemia- LDL 48 on 1/19*** Continue atorvastatin Heart healthy low-sodium high-fiber diet Maintain physical activity Follows with PCP     Disposition: Follow-up with Dr. Martinique in 9-10 months   Home Medications    Prior to Admission medications   Medication Sig Start Date End Date Taking? Authorizing Provider  acetaminophen (TYLENOL) 325 MG tablet Take 650 mg by mouth every 6 (six) hours as needed for headache (pain).    [provider]  amLODipine (NORVASC) 10 MG tablet Take 10 mg by mouth daily.    [provider]  atorvastatin (LIPITOR) 20 MG tablet Take 20 mg by mouth daily.    [provider]  clopidogrel (PLAVIX) 75 MG tablet Take 75 mg by mouth daily.    [provider]  cyclobenzaprine  (FLEXERIL) 10 MG tablet Take 0.5 tablets (5 mg total) by mouth 3 (three) times daily as needed for muscle spasms. 11/23/20   Elgergawy, Silver Huguenin, MD  diphenoxylate-atropine (LOMOTIL) 2.5-0.025 MG tablet Take 1 tablet by mouth 3 (three) times daily. 08/27/19   Domenic Polite, MD  ferrous sulfate 220 (44 Fe) MG/5ML solution Take 5 mLs (220 mg total) by mouth 2 (two) times daily with a meal. 11/23/20   Elgergawy, Silver Huguenin, MD  levETIRAcetam (KEPPRA) 500 MG tablet TAKE 1 TABLET BY MOUTH TWICE A DAY Patient taking differently: Take 500 mg by mouth 2 (two) times daily. 01/20/20   Dohmeier, Asencion Partridge, MD  loperamide (IMODIUM) 2 MG capsule Take 2 mg by mouth daily as needed for diarrhea or loose stools.     [provider]  magnesium oxide (MAG-OX) 400 MG tablet Take 1 tablet by mouth daily. 05/08/20   [provider]  metoprolol tartrate (LOPRESSOR) 100 MG tablet Take 1 tablet (100 mg total) by mouth 2 (two) times daily. 11/23/20   Elgergawy, Silver Huguenin, MD  montelukast (SINGULAIR) 10 MG tablet Take 10 mg by mouth daily. 08/09/18   [provider]  nitroGLYCERIN (NITROSTAT) 0.4 MG SL tablet Place 1 tablet (0.4 mg total) under the tongue every 5 (five) minutes as needed for chest pain. 11/08/15   Martinique, Peter M, MD  ondansetron (ZOFRAN-ODT) 4 MG disintegrating tablet Dissolve 1 tablet on tongue twice a day for 7 days then decrease to 1 tablet every 6-8 hours as needed. 11/28/20   Noralyn Pick, NP  Pancrelipase, Lip-Prot-Amyl, (ZENPEP) 40000-126000 units CPEP Take 2 capsules by mouth 3 (three) times daily with meals. Take 2 capsules three times a day with meals for one week and assess Patient taking differently: Take 1 capsule by mouth daily. 06/27/20   Ladene Artist, MD  pantoprazole (PROTONIX) 40 MG tablet Take 1 tablet (40 mg total) by mouth 2 (two) times daily. 06/27/20   Ladene Artist, MD  SODIUM FLUORIDE 5000 PPM 1.1 % PSTE SMARTSIG:Sparingly By Mouth Twice Daily 05/16/20    [provider]  traMADol (ULTRAM) 50 MG tablet Take 50 mg by mouth 3 (three) times daily as needed for moderate pain. 06/01/20   [provider]  venlafaxine XR (EFFEXOR-XR) 75 MG 24 hr capsule Take 1 capsule (75 mg total)  by mouth daily. 05/18/20   [provider]  vitamin B-12 (CYANOCOBALAMIN) 500 MCG tablet Take 500 mcg by mouth daily.    [provider]  Vitamin D, Ergocalciferol, (DRISDOL) 1.25 MG (50000 UT) CAPS capsule Take 50,000 Units by mouth every Wednesday.     [provider]    Family History    Family History  Problem Relation Age of Onset   Hypertension Mother    Osteoarthritis Mother    Pulmonary embolism Mother    Hypertension Father        aneurysm   Stroke Father    Colon cancer Paternal Grandfather    Stroke Other    Breast cancer Neg Hx    Colon polyps Neg Hx    Esophageal cancer Neg Hx    Rectal cancer Neg Hx    Stomach cancer Neg Hx    Pancreatic cancer Neg Hx    Liver disease Neg Hx    She indicated that her mother is deceased. She indicated that her father is deceased. She indicated that her maternal grandmother is deceased. She indicated that her maternal grandfather is deceased. She indicated that her paternal grandmother is deceased. She indicated that her paternal grandfather is deceased. She indicated that the status of her neg hx is unknown. She indicated that the status of her other is unknown.  Social History    Social History   Socioeconomic History   Marital status: Divorced    Spouse name: Not on file   Number of children: 2   Years of education: Not on file   Highest education level: Not on file  Occupational History   Occupation: retired    Fish farm manager: DISABLED  Tobacco Use   Smoking status: Former    Packs/day: 1.00    Years: 50.00    Total pack years: 50.00    Types: Cigarettes    Quit date: 10/14/2010    Years since quitting: 11.5   Smokeless tobacco: Never  Vaping Use   Vaping Use:  Never used  Substance and Sexual Activity   Alcohol use: No   Drug use: No   Sexual activity: Not on file  Other Topics Concern   Not on file  Social History Narrative   Not on file   Social Determinants of Health   Financial Resource Strain: Not on file  Food Insecurity: Not on file  Transportation Needs: Not on file  Physical Activity: Not on file  Stress: Not on file  Social Connections: Not on file  Intimate Partner Violence: Not on file     Review of Systems    General:  No chills, fever, night sweats or weight changes.  Cardiovascular:  No chest pain, dyspnea on exertion, edema, orthopnea, palpitations, paroxysmal nocturnal dyspnea. Dermatological: No rash, lesions/masses Respiratory: No cough, dyspnea Urologic: No hematuria, dysuria Abdominal:   No nausea, vomiting, diarrhea, bright red blood per rectum, melena, or hematemesis Neurologic:  No visual changes, wkns, changes in mental status. All other systems reviewed and are otherwise negative except as noted above.  Physical Exam    VS:  There were no vitals taken for this visit. , BMI There is no height or weight on file to calculate BMI. GEN: Well nourished, well developed, in no acute distress. HEENT: normal. Neck: Supple, no JVD, carotid bruits, or masses. Cardiac: RRR, no murmurs, rubs, or gallops. No clubbing, cyanosis, generalized bilateral lower extremity nonpitting edema***.  Radials/DP/PT 2+ and equal bilaterally.  Respiratory:  Respirations regular and unlabored,  clear to auscultation bilaterally. GI: Soft, nontender, nondistended, BS + x 4. MS: no deformity or atrophy. Skin: warm and dry, no rash. Neuro:  Strength and sensation are intact. Psych: Normal affect.  Accessory Clinical Findings    Recent Labs: 12/25/2021: B Natriuretic Peptide 764.2; TSH 1.434 12/27/2021: Magnesium 2.2 04/08/2022: ALT 16 04/09/2022: BUN 18; Creatinine, Ser 1.75; Hemoglobin 11.5; Platelets 213; Potassium 3.8; Sodium 141    Recent Lipid Panel    Component Value Date/Time   CHOL 100 12/26/2021 0306   TRIG 78 12/26/2021 0306   HDL 59 12/26/2021 0306   CHOLHDL 1.7 12/26/2021 0306   VLDL 16 12/26/2021 0306   LDLCALC 25 12/26/2021 0306    ECG personally reviewed by me today-none today***.  Nuclear stress test 12/18/2018   The left ventricular ejection fraction is mildly decreased (45-54%). Nuclear stress EF: 48%. No T wave inversion was noted during stress. There was no ST segment deviation noted during stress. This is an intermediate risk study.   No reversible ischemia. LVEF 48% with basal septal dyskinesis. This is an intermediate risk study (LVEF<49%) - recommend correlating findings with echo.   Echocardiogram 11/17/2020 IMPRESSIONS     1. Left ventricular ejection fraction, by estimation, is 45 to 50%. The  left ventricle has mildly decreased function. The left ventricle  demonstrates regional wall motion abnormalities (see scoring  diagram/findings for description). There is moderate  left ventricular hypertrophy.   2. The mitral valve was not well visualized. No evidence of mitral valve  regurgitation.   3. The aortic valve is tricuspid. Mild aortic valve sclerosis is present,  with no evidence of aortic valve stenosis.   Comparison(s): Prior images reviewed side by side. Changes from prior  study are noted.   Assessment & Plan   1.     Joanna Ng. Shatara Stanek NP-C    04/14/2022, 1:35 PM [facility] [address] Office [phone] Fax [phone]  Notice: This dictation was prepared with Dragon dictation along with smaller phrase technology. Any transcriptional errors that result from this process are unintentional and may not be corrected upon review.  I spent 12*** minutes examining this patient, reviewing medications, and using patient centered shared decision making involving her cardiac care.  Prior to her visit I spent greater than 20  minutes reviewing her past medical history,  medications, and prior cardiac tests.

## 2022-04-15 ENCOUNTER — Other Ambulatory Visit: Payer: Self-pay

## 2022-04-15 ENCOUNTER — Emergency Department (HOSPITAL_COMMUNITY): Payer: Medicare HMO

## 2022-04-15 ENCOUNTER — Inpatient Hospital Stay (HOSPITAL_COMMUNITY)
Admission: EM | Admit: 2022-04-15 | Discharge: 2022-04-19 | DRG: 034 | Disposition: A | Discharge status: Home Health | Payer: Medicare HMO | Attending: Family Medicine | Admitting: Family Medicine

## 2022-04-15 ENCOUNTER — Encounter (HOSPITAL_COMMUNITY): Payer: Self-pay

## 2022-04-15 DIAGNOSIS — K219 Gastro-esophageal reflux disease without esophagitis: Secondary | ICD-10-CM | POA: Diagnosis present

## 2022-04-15 DIAGNOSIS — Z9842 Cataract extraction status, left eye: Secondary | ICD-10-CM

## 2022-04-15 DIAGNOSIS — G40909 Epilepsy, unspecified, not intractable, without status epilepticus: Secondary | ICD-10-CM | POA: Diagnosis present

## 2022-04-15 DIAGNOSIS — Z885 Allergy status to narcotic agent status: Secondary | ICD-10-CM

## 2022-04-15 DIAGNOSIS — R569 Unspecified convulsions: Secondary | ICD-10-CM

## 2022-04-15 DIAGNOSIS — Z91048 Other nonmedicinal substance allergy status: Secondary | ICD-10-CM

## 2022-04-15 DIAGNOSIS — Z8249 Family history of ischemic heart disease and other diseases of the circulatory system: Secondary | ICD-10-CM

## 2022-04-15 DIAGNOSIS — R471 Dysarthria and anarthria: Secondary | ICD-10-CM | POA: Diagnosis present

## 2022-04-15 DIAGNOSIS — I160 Hypertensive urgency: Secondary | ICD-10-CM | POA: Diagnosis not present

## 2022-04-15 DIAGNOSIS — E785 Hyperlipidemia, unspecified: Secondary | ICD-10-CM | POA: Diagnosis present

## 2022-04-15 DIAGNOSIS — I69351 Hemiplegia and hemiparesis following cerebral infarction affecting right dominant side: Secondary | ICD-10-CM

## 2022-04-15 DIAGNOSIS — M5030 Other cervical disc degeneration, unspecified cervical region: Secondary | ICD-10-CM | POA: Diagnosis not present

## 2022-04-15 DIAGNOSIS — G4733 Obstructive sleep apnea (adult) (pediatric): Secondary | ICD-10-CM | POA: Diagnosis present

## 2022-04-15 DIAGNOSIS — G839 Paralytic syndrome, unspecified: Secondary | ICD-10-CM | POA: Diagnosis not present

## 2022-04-15 DIAGNOSIS — I10 Essential (primary) hypertension: Secondary | ICD-10-CM

## 2022-04-15 DIAGNOSIS — N390 Urinary tract infection, site not specified: Secondary | ICD-10-CM | POA: Diagnosis not present

## 2022-04-15 DIAGNOSIS — E1122 Type 2 diabetes mellitus with diabetic chronic kidney disease: Secondary | ICD-10-CM | POA: Diagnosis present

## 2022-04-15 DIAGNOSIS — K589 Irritable bowel syndrome without diarrhea: Secondary | ICD-10-CM | POA: Diagnosis present

## 2022-04-15 DIAGNOSIS — E669 Obesity, unspecified: Secondary | ICD-10-CM | POA: Diagnosis present

## 2022-04-15 DIAGNOSIS — F39 Unspecified mood [affective] disorder: Secondary | ICD-10-CM | POA: Diagnosis present

## 2022-04-15 DIAGNOSIS — I129 Hypertensive chronic kidney disease with stage 1 through stage 4 chronic kidney disease, or unspecified chronic kidney disease: Secondary | ICD-10-CM | POA: Diagnosis present

## 2022-04-15 DIAGNOSIS — G8929 Other chronic pain: Secondary | ICD-10-CM | POA: Diagnosis present

## 2022-04-15 DIAGNOSIS — K861 Other chronic pancreatitis: Secondary | ICD-10-CM | POA: Diagnosis present

## 2022-04-15 DIAGNOSIS — J449 Chronic obstructive pulmonary disease, unspecified: Secondary | ICD-10-CM | POA: Diagnosis not present

## 2022-04-15 DIAGNOSIS — K902 Blind loop syndrome, not elsewhere classified: Secondary | ICD-10-CM | POA: Diagnosis present

## 2022-04-15 DIAGNOSIS — J432 Centrilobular emphysema: Secondary | ICD-10-CM | POA: Diagnosis present

## 2022-04-15 DIAGNOSIS — Z90711 Acquired absence of uterus with remaining cervical stump: Secondary | ICD-10-CM

## 2022-04-15 DIAGNOSIS — I700 Atherosclerosis of aorta: Secondary | ICD-10-CM | POA: Diagnosis present

## 2022-04-15 DIAGNOSIS — I6501 Occlusion and stenosis of right vertebral artery: Secondary | ICD-10-CM | POA: Diagnosis not present

## 2022-04-15 DIAGNOSIS — I63511 Cerebral infarction due to unspecified occlusion or stenosis of right middle cerebral artery: Secondary | ICD-10-CM | POA: Diagnosis not present

## 2022-04-15 DIAGNOSIS — R29705 NIHSS score 5: Secondary | ICD-10-CM | POA: Diagnosis present

## 2022-04-15 DIAGNOSIS — I5040 Unspecified combined systolic (congestive) and diastolic (congestive) heart failure: Secondary | ICD-10-CM | POA: Diagnosis not present

## 2022-04-15 DIAGNOSIS — I509 Heart failure, unspecified: Secondary | ICD-10-CM | POA: Diagnosis not present

## 2022-04-15 DIAGNOSIS — I130 Hypertensive heart and chronic kidney disease with heart failure and stage 1 through stage 4 chronic kidney disease, or unspecified chronic kidney disease: Secondary | ICD-10-CM | POA: Diagnosis not present

## 2022-04-15 DIAGNOSIS — Z87891 Personal history of nicotine dependence: Secondary | ICD-10-CM | POA: Diagnosis not present

## 2022-04-15 DIAGNOSIS — N189 Chronic kidney disease, unspecified: Secondary | ICD-10-CM | POA: Diagnosis not present

## 2022-04-15 DIAGNOSIS — I6523 Occlusion and stenosis of bilateral carotid arteries: Secondary | ICD-10-CM | POA: Diagnosis not present

## 2022-04-15 DIAGNOSIS — R4781 Slurred speech: Secondary | ICD-10-CM | POA: Diagnosis not present

## 2022-04-15 DIAGNOSIS — D72825 Bandemia: Secondary | ICD-10-CM | POA: Diagnosis not present

## 2022-04-15 DIAGNOSIS — I2510 Atherosclerotic heart disease of native coronary artery without angina pectoris: Secondary | ICD-10-CM | POA: Diagnosis present

## 2022-04-15 DIAGNOSIS — I739 Peripheral vascular disease, unspecified: Secondary | ICD-10-CM | POA: Diagnosis present

## 2022-04-15 DIAGNOSIS — I639 Cerebral infarction, unspecified: Secondary | ICD-10-CM | POA: Diagnosis present

## 2022-04-15 DIAGNOSIS — K8681 Exocrine pancreatic insufficiency: Secondary | ICD-10-CM | POA: Diagnosis present

## 2022-04-15 DIAGNOSIS — N183 Chronic kidney disease, stage 3 unspecified: Secondary | ICD-10-CM | POA: Diagnosis present

## 2022-04-15 DIAGNOSIS — Z91041 Radiographic dye allergy status: Secondary | ICD-10-CM

## 2022-04-15 DIAGNOSIS — R4701 Aphasia: Secondary | ICD-10-CM | POA: Diagnosis present

## 2022-04-15 DIAGNOSIS — N1832 Chronic kidney disease, stage 3b: Secondary | ICD-10-CM | POA: Diagnosis present

## 2022-04-15 DIAGNOSIS — E1151 Type 2 diabetes mellitus with diabetic peripheral angiopathy without gangrene: Secondary | ICD-10-CM | POA: Diagnosis not present

## 2022-04-15 DIAGNOSIS — I63231 Cerebral infarction due to unspecified occlusion or stenosis of right carotid arteries: Secondary | ICD-10-CM | POA: Diagnosis present

## 2022-04-15 DIAGNOSIS — Z9841 Cataract extraction status, right eye: Secondary | ICD-10-CM

## 2022-04-15 DIAGNOSIS — I672 Cerebral atherosclerosis: Secondary | ICD-10-CM | POA: Diagnosis not present

## 2022-04-15 DIAGNOSIS — I63233 Cerebral infarction due to unspecified occlusion or stenosis of bilateral carotid arteries: Secondary | ICD-10-CM | POA: Diagnosis not present

## 2022-04-15 DIAGNOSIS — M1990 Unspecified osteoarthritis, unspecified site: Secondary | ICD-10-CM | POA: Diagnosis present

## 2022-04-15 DIAGNOSIS — E538 Deficiency of other specified B group vitamins: Secondary | ICD-10-CM | POA: Diagnosis present

## 2022-04-15 DIAGNOSIS — Z8719 Personal history of other diseases of the digestive system: Secondary | ICD-10-CM | POA: Diagnosis not present

## 2022-04-15 DIAGNOSIS — E0859 Diabetes mellitus due to underlying condition with other circulatory complications: Secondary | ICD-10-CM | POA: Diagnosis present

## 2022-04-15 DIAGNOSIS — H538 Other visual disturbances: Secondary | ICD-10-CM | POA: Diagnosis not present

## 2022-04-15 DIAGNOSIS — Z7902 Long term (current) use of antithrombotics/antiplatelets: Secondary | ICD-10-CM

## 2022-04-15 DIAGNOSIS — Z6832 Body mass index (BMI) 32.0-32.9, adult: Secondary | ICD-10-CM

## 2022-04-15 DIAGNOSIS — E782 Mixed hyperlipidemia: Secondary | ICD-10-CM | POA: Diagnosis not present

## 2022-04-15 DIAGNOSIS — J45909 Unspecified asthma, uncomplicated: Secondary | ICD-10-CM | POA: Diagnosis present

## 2022-04-15 DIAGNOSIS — I6503 Occlusion and stenosis of bilateral vertebral arteries: Secondary | ICD-10-CM | POA: Diagnosis not present

## 2022-04-15 DIAGNOSIS — Z79899 Other long term (current) drug therapy: Secondary | ICD-10-CM

## 2022-04-15 DIAGNOSIS — G9341 Metabolic encephalopathy: Secondary | ICD-10-CM | POA: Diagnosis present

## 2022-04-15 DIAGNOSIS — I6521 Occlusion and stenosis of right carotid artery: Secondary | ICD-10-CM | POA: Diagnosis not present

## 2022-04-15 DIAGNOSIS — G4730 Sleep apnea, unspecified: Secondary | ICD-10-CM | POA: Diagnosis not present

## 2022-04-15 DIAGNOSIS — Z882 Allergy status to sulfonamides status: Secondary | ICD-10-CM

## 2022-04-15 DIAGNOSIS — Z7982 Long term (current) use of aspirin: Secondary | ICD-10-CM

## 2022-04-15 DIAGNOSIS — K760 Fatty (change of) liver, not elsewhere classified: Secondary | ICD-10-CM | POA: Diagnosis present

## 2022-04-15 DIAGNOSIS — Z823 Family history of stroke: Secondary | ICD-10-CM

## 2022-04-15 DIAGNOSIS — Z888 Allergy status to other drugs, medicaments and biological substances status: Secondary | ICD-10-CM

## 2022-04-15 LAB — I-STAT CHEM 8, ED
BUN: 19 mg/dL (ref 8–23)
Calcium, Ion: 1.12 mmol/L — ABNORMAL LOW (ref 1.15–1.40)
Chloride: 104 mmol/L (ref 98–111)
Creatinine, Ser: 1.8 mg/dL — ABNORMAL HIGH (ref 0.44–1.00)
Glucose, Bld: 114 mg/dL — ABNORMAL HIGH (ref 70–99)
HCT: 36 % (ref 36.0–46.0)
Hemoglobin: 12.2 g/dL (ref 12.0–15.0)
Potassium: 3.9 mmol/L (ref 3.5–5.1)
Sodium: 135 mmol/L (ref 135–145)
TCO2: 20 mmol/L — ABNORMAL LOW (ref 22–32)

## 2022-04-15 LAB — COMPREHENSIVE METABOLIC PANEL
ALT: 21 U/L (ref 0–44)
AST: 29 U/L (ref 15–41)
Albumin: 3.2 g/dL — ABNORMAL LOW (ref 3.5–5.0)
Alkaline Phosphatase: 100 U/L (ref 38–126)
Anion gap: 9 (ref 5–15)
BUN: 17 mg/dL (ref 8–23)
CO2: 19 mmol/L — ABNORMAL LOW (ref 22–32)
Calcium: 8.6 mg/dL — ABNORMAL LOW (ref 8.9–10.3)
Chloride: 107 mmol/L (ref 98–111)
Creatinine, Ser: 1.68 mg/dL — ABNORMAL HIGH (ref 0.44–1.00)
GFR, Estimated: 32 mL/min — ABNORMAL LOW (ref >60)
Glucose, Bld: 121 mg/dL — ABNORMAL HIGH (ref 70–99)
Potassium: 3.8 mmol/L (ref 3.5–5.1)
Sodium: 135 mmol/L (ref 135–145)
Total Bilirubin: 0.8 mg/dL (ref 0.3–1.2)
Total Protein: 6.3 g/dL — ABNORMAL LOW (ref 6.5–8.1)

## 2022-04-15 LAB — DIFFERENTIAL
Abs Immature Granulocytes: 0.1 10*3/uL — ABNORMAL HIGH (ref 0.00–0.07)
Basophils Absolute: 0.1 10*3/uL (ref 0.0–0.1)
Basophils Relative: 0 %
Eosinophils Absolute: 0.4 10*3/uL (ref 0.0–0.5)
Eosinophils Relative: 2 %
Immature Granulocytes: 1 %
Lymphocytes Relative: 16 %
Lymphs Abs: 3 10*3/uL (ref 0.7–4.0)
Monocytes Absolute: 0.8 10*3/uL (ref 0.1–1.0)
Monocytes Relative: 5 %
Neutro Abs: 13.9 10*3/uL — ABNORMAL HIGH (ref 1.7–7.7)
Neutrophils Relative %: 76 %

## 2022-04-15 LAB — URINALYSIS, ROUTINE W REFLEX MICROSCOPIC
Bilirubin Urine: NEGATIVE
Glucose, UA: NEGATIVE mg/dL
Hgb urine dipstick: NEGATIVE
Ketones, ur: NEGATIVE mg/dL
Nitrite: NEGATIVE
Protein, ur: NEGATIVE mg/dL
Specific Gravity, Urine: 1.003 — ABNORMAL LOW (ref 1.005–1.030)
pH: 5 (ref 5.0–8.0)

## 2022-04-15 LAB — CBC
HCT: 36.7 % (ref 36.0–46.0)
Hemoglobin: 11.4 g/dL — ABNORMAL LOW (ref 12.0–15.0)
MCH: 29.8 pg (ref 26.0–34.0)
MCHC: 31.1 g/dL (ref 30.0–36.0)
MCV: 95.8 fL (ref 80.0–100.0)
Platelets: 224 10*3/uL (ref 150–400)
RBC: 3.83 MIL/uL — ABNORMAL LOW (ref 3.87–5.11)
RDW: 13.5 % (ref 11.5–15.5)
WBC: 18.3 10*3/uL — ABNORMAL HIGH (ref 4.0–10.5)
nRBC: 0 % (ref 0.0–0.2)

## 2022-04-15 LAB — PROTIME-INR
INR: 1.2 (ref 0.8–1.2)
Prothrombin Time: 14.7 seconds (ref 11.4–15.2)

## 2022-04-15 LAB — RAPID URINE DRUG SCREEN, HOSP PERFORMED
Amphetamines: NOT DETECTED
Barbiturates: NOT DETECTED
Benzodiazepines: NOT DETECTED
Cocaine: NOT DETECTED
Opiates: POSITIVE — AB
Tetrahydrocannabinol: NOT DETECTED

## 2022-04-15 LAB — CBG MONITORING, ED
Glucose-Capillary: 116 mg/dL — ABNORMAL HIGH (ref 70–99)
Glucose-Capillary: 132 mg/dL — ABNORMAL HIGH (ref 70–99)

## 2022-04-15 LAB — APTT: aPTT: 28 seconds (ref 24–36)

## 2022-04-15 LAB — ETHANOL: Alcohol, Ethyl (B): <10 mg/dL (ref <10)

## 2022-04-15 MED ORDER — SENNOSIDES-DOCUSATE SODIUM 8.6-50 MG PO TABS: 1.0000 | ORAL_TABLET | Freq: Every evening | ORAL | Status: DC | PRN

## 2022-04-15 MED ORDER — CLOPIDOGREL BISULFATE 75 MG PO TABS
75.0000 mg | ORAL_TABLET | Freq: Every day | ORAL | Status: DC
  Administered 2022-04-16 – 2022-04-19 (×3): 75 mg via ORAL
  Filled 2022-04-15 (×3): qty 1

## 2022-04-15 MED ORDER — SODIUM CHLORIDE 0.9% FLUSH: 3.0000 mL | Freq: Once | INTRAVENOUS | Status: DC

## 2022-04-15 MED ORDER — HYDRALAZINE HCL 20 MG/ML IJ SOLN
5.0000 mg | INTRAMUSCULAR | Status: DC | PRN
Start: 2022-04-15 — End: 2022-04-17
  Filled 2022-04-15: qty 1

## 2022-04-15 MED ORDER — ASPIRIN 81 MG PO CHEW
81.0000 mg | CHEWABLE_TABLET | Freq: Every day | ORAL | Status: DC
  Administered 2022-04-16 – 2022-04-19 (×3): 81 mg via ORAL
  Filled 2022-04-15 (×3): qty 1

## 2022-04-15 MED ORDER — SODIUM CHLORIDE 0.9 % IV SOLN: INTRAVENOUS | Status: DC

## 2022-04-15 MED ORDER — HYDROCODONE-ACETAMINOPHEN 10-325 MG PO TABS
1.0000 | ORAL_TABLET | Freq: Four times a day (QID) | ORAL | Status: DC | PRN
  Administered 2022-04-15 – 2022-04-18 (×2): 1 via ORAL
  Filled 2022-04-15 (×2): qty 1

## 2022-04-15 MED ORDER — ACETAMINOPHEN 650 MG RE SUPP: 650.0000 mg | RECTAL | Status: DC | PRN

## 2022-04-15 MED ORDER — VENLAFAXINE HCL ER 75 MG PO CP24
75.0000 mg | ORAL_CAPSULE | Freq: Every day | ORAL | Status: DC
  Administered 2022-04-16 – 2022-04-19 (×3): 75 mg via ORAL
  Filled 2022-04-15 (×3): qty 1

## 2022-04-15 MED ORDER — ATORVASTATIN CALCIUM 40 MG PO TABS
40.0000 mg | ORAL_TABLET | Freq: Every day | ORAL | Status: DC
  Administered 2022-04-16 – 2022-04-19 (×3): 40 mg via ORAL
  Filled 2022-04-15 (×3): qty 1

## 2022-04-15 MED ORDER — LEVETIRACETAM 500 MG PO TABS
500.0000 mg | ORAL_TABLET | Freq: Two times a day (BID) | ORAL | Status: DC
  Administered 2022-04-15 – 2022-04-19 (×7): 500 mg via ORAL
  Filled 2022-04-15 (×7): qty 1

## 2022-04-15 MED ORDER — ALBUTEROL SULFATE (2.5 MG/3ML) 0.083% IN NEBU: 2.5000 mg | INHALATION_SOLUTION | Freq: Four times a day (QID) | RESPIRATORY_TRACT | Status: DC | PRN

## 2022-04-15 MED ORDER — STROKE: EARLY STAGES OF RECOVERY BOOK
Freq: Once | Status: AC
  Filled 2022-04-15: qty 1

## 2022-04-15 MED ORDER — DIPHENHYDRAMINE HCL 50 MG/ML IJ SOLN
50.0000 mg | Freq: Once | INTRAMUSCULAR | Status: AC
  Filled 2022-04-15: qty 1

## 2022-04-15 MED ORDER — PREDNISONE 20 MG PO TABS
50.0000 mg | ORAL_TABLET | Freq: Four times a day (QID) | ORAL | Status: AC
  Administered 2022-04-16 (×2): 50 mg via ORAL
  Filled 2022-04-15 (×2): qty 3

## 2022-04-15 MED ORDER — PANTOPRAZOLE SODIUM 40 MG PO TBEC
40.0000 mg | DELAYED_RELEASE_TABLET | Freq: Two times a day (BID) | ORAL | Status: DC
  Administered 2022-04-16 – 2022-04-19 (×6): 40 mg via ORAL
  Filled 2022-04-15 (×6): qty 1

## 2022-04-15 MED ORDER — ACETAMINOPHEN 160 MG/5ML PO SOLN: 650.0000 mg | ORAL | Status: DC | PRN

## 2022-04-15 MED ORDER — INSULIN ASPART 100 UNIT/ML IJ SOLN
0.0000 [IU] | Freq: Three times a day (TID) | INTRAMUSCULAR | Status: DC
  Administered 2022-04-15: 2 [IU] via SUBCUTANEOUS
  Administered 2022-04-16: 5 [IU] via SUBCUTANEOUS
  Administered 2022-04-16: 3 [IU] via SUBCUTANEOUS
  Administered 2022-04-16: 5 [IU] via SUBCUTANEOUS
  Administered 2022-04-17 – 2022-04-18 (×2): 2 [IU] via SUBCUTANEOUS

## 2022-04-15 MED ORDER — PANCRELIPASE (LIP-PROT-AMYL) 36000-114000 UNITS PO CPEP
36000.0000 [IU] | ORAL_CAPSULE | Freq: Every day | ORAL | Status: DC
  Administered 2022-04-16 – 2022-04-19 (×3): 36000 [IU] via ORAL
  Filled 2022-04-15 (×4): qty 1

## 2022-04-15 MED ORDER — ENOXAPARIN SODIUM 30 MG/0.3ML IJ SOSY
30.0000 mg | PREFILLED_SYRINGE | INTRAMUSCULAR | Status: DC
  Administered 2022-04-15 – 2022-04-17 (×2): 30 mg via SUBCUTANEOUS
  Filled 2022-04-15 (×2): qty 0.3

## 2022-04-15 MED ORDER — ALBUTEROL SULFATE HFA 108 (90 BASE) MCG/ACT IN AERS: 1.0000 | INHALATION_SPRAY | Freq: Four times a day (QID) | RESPIRATORY_TRACT | Status: DC | PRN

## 2022-04-15 MED ORDER — ONDANSETRON HCL 4 MG/2ML IJ SOLN
4.0000 mg | Freq: Four times a day (QID) | INTRAMUSCULAR | Status: DC | PRN
  Administered 2022-04-15: 4 mg via INTRAVENOUS
  Filled 2022-04-15: qty 2

## 2022-04-15 MED ORDER — ATORVASTATIN CALCIUM 10 MG PO TABS: 20.0000 mg | ORAL_TABLET | Freq: Every day | ORAL | Status: DC

## 2022-04-15 MED ORDER — MONTELUKAST SODIUM 10 MG PO TABS
10.0000 mg | ORAL_TABLET | Freq: Every day | ORAL | Status: DC
  Administered 2022-04-16 – 2022-04-19 (×3): 10 mg via ORAL
  Filled 2022-04-15 (×3): qty 1

## 2022-04-15 MED ORDER — MIRTAZAPINE 15 MG PO TABS
15.0000 mg | ORAL_TABLET | Freq: Every day | ORAL | Status: DC
  Administered 2022-04-15 – 2022-04-18 (×4): 15 mg via ORAL
  Filled 2022-04-15 (×4): qty 1

## 2022-04-15 MED ORDER — PREDNISONE 20 MG PO TABS
50.0000 mg | ORAL_TABLET | Freq: Four times a day (QID) | ORAL | Status: DC
  Administered 2022-04-15 (×2): 50 mg via ORAL
  Filled 2022-04-15: qty 3

## 2022-04-15 MED ORDER — DIPHENHYDRAMINE HCL 25 MG PO CAPS
50.0000 mg | ORAL_CAPSULE | Freq: Once | ORAL | Status: AC
  Administered 2022-04-16: 50 mg via ORAL
  Filled 2022-04-15: qty 2

## 2022-04-15 MED ORDER — ACETAMINOPHEN 325 MG PO TABS
650.0000 mg | ORAL_TABLET | ORAL | Status: DC | PRN
  Administered 2022-04-15 – 2022-04-18 (×3): 650 mg via ORAL
  Filled 2022-04-15 (×3): qty 2

## 2022-04-15 NOTE — ED Notes (Signed)
Eating dinner, drinking fluids. Assisted with openine up container

## 2022-04-15 NOTE — H&P (Addendum)
History and Physical    Patient: [name] FMB:[id number] DOB: 08-15-1946 DOA: 04/15/2022 DOS: the patient was seen and examined on 04/15/2022 PCP: Donnajean Lopes, MD  Patient coming from: Home - lives alone; NOK: Arienna, [place], [phone]; Trula Slade, [phone]   Chief Complaint: Stroke-like symptoms  HPI: [name] is a 76 y.o. 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   She went to the doctor and she got numb in her R arm.  She is supposed to have carotid surgery coming up (not yet set up) and saw her PCP about this today.  She also has an appointment with cardiology on Wednesday  She had a frontal headache today, no longer present significantly.  +visual blurriness.  +aphasia at the doctor's office.  Paperwork from today's visit with NP [facility] reviewed.  BP 188/80 with new onset numbness to R forearm from elbow to fingers with R cheek numbness and transient slurred speech.  Also intermittent CP and HA with blurred vision.  Called EMS due to concern for acute CVA.    ER Course:  Code stroke, seen by neuro team.  Aphasia, confusion, RUE numbness/weakness.  Infarct in occipital lobe but this does not explain all the symptoms.  Also needs encephalopathy evaluation.     Review of Systems: As mentioned in the history of present illness. All other systems reviewed and are negative. Past Medical History:  Diagnosis Date   Anxiety    Arthritis    back    Blind loop syndrome    Clotting disorder (HCC)    Hx Clot in leg per pt    Colostomy present [facility])    Coronary artery disease    a. known 60-70% mid-LAD stenosis with caths in 1999, 2002, 2006, and 2012 showing stable anatomy b. 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[place])    Diabetes mellitus     Diverticulosis    Dizziness    chronic   Dyslipidemia    Fatty liver    GERD (gastroesophageal reflux disease)    hiatial hernia   Heart murmur    Hiatal hernia    Hypertension    Irritable bowel syndrome    Pancreatitis, chronic ([place])    Peripheral vascular disease ([place]) 02/20/2010   s/p stent of left SFA; carotid stenosis   Seizure ([place]) 11/04/2018   last 1 yr 2020 per pt unsure month    Sleep apnea    no cpap    Stroke ([place])    Thyroid nodule    Vitamin B12 deficiency    Past Surgical History:  Procedure Laterality Date   ABDOMINAL HYSTERECTOMY     partial   AUGMENTATION MAMMAPLASTY     bilateral 1976 retro    BREAST ENHANCEMENT SURGERY     CARDIAC CATHETERIZATION  07/07/98;08/31/01;03/04/05   60 -70% LAD   CATARACT EXTRACTION Bilateral    COLON RESECTION N/A 10/14/2017   Procedure: EXPLORATORY LAPAROTOMY, EXTENDED RIGHT COLECTOMY; APPLICATION OF ABDOMINAL VACUUM DRESSING;  Surgeon: Jovita Kussmaul, MD;  Location: [facility]S;  Service: General;  Laterality: N/A;   COLONOSCOPY     ENDOBRONCHIAL ULTRASOUND Bilateral 02/24/2017   Procedure: ENDOBRONCHIAL ULTRASOUND;  Surgeon: Collene Gobble, MD;  Location: [facility] END[facility]COPY;  Service: Cardiopulmonary;  Laterality: Bilateral;   FEMORAL ARTERY STENT  Left leg   LAPAROTOMY N/A 10/16/2017   Procedure: EXPLORATORY LAPAROTOMY, PARTIAL OMENTECTOMY, RESECTION ISCHEMIC ILEUM 716RC, APPLICATION OF VAC ABDOMINAL DRESSING;  Surgeon: Armandina Gemma, MD;  Location: [facility] OR[facility];  Service: General;  Laterality: N/A;   LAPAROTOMY N/A 10/18/2017   Procedure: RE-EXPLORATION OF ABDOMEN, ILEOSTOMY CREATION;  Surgeon: Stark Klein, MD;  Location: [facility];  Service: General;  Laterality: N/A;   LUNG SURGERY  03/2017   Benign polyps removed   PATELLA REALIGNMENT Left    POLYPECTOMY     SIGMOIDOSCOPY     TONSILLECTOMY     Social History:  reports that she quit smoking about 11 years ago. Her smoking use included cigarettes. She has a 50.00 pack-year  smoking history. She has never used smokeless tobacco. She reports that she does not drink alcohol and does not use drugs.  Allergies  Allergen Reactions   Adhesive [Tape] Other (See Comments)    "Took off my skin"   Other Rash    "Took off my skin"   Ace Inhibitors Other (See Comments)    Unknown   Sulfa Antibiotics Other (See Comments)    Unknown   Topiramate Other (See Comments)   Codeine Other (See Comments)    Altered mental state    Iodinated Contrast Media Rash    Family History  Problem Relation Age of Onset   Hypertension Mother    Osteoarthritis Mother    Pulmonary embolism Mother    Hypertension Father        aneurysm   Stroke Father    Colon cancer Paternal Grandfather    Stroke Other    Breast cancer Neg Hx    Colon polyps Neg Hx    Esophageal cancer Neg Hx    Rectal cancer Neg Hx    Stomach cancer Neg Hx    Pancreatic cancer Neg Hx    Liver disease Neg Hx     Prior to Admission medications   Medication Sig Start Date End Date Taking? Authorizing Provider  acetaminophen (TYLENOL) 325 MG tablet Take 650 mg by mouth every 6 (six) hours as needed for mild pain.   Yes [provider]  albuterol (VENTOLIN HFA) 108 (90 Base) MCG/ACT inhaler Inhale 1-2 puffs into the lungs every 6 (six) hours as needed for wheezing or shortness of breath. 03/30/21  Yes [provider]  amLODipine (NORVASC) 10 MG tablet Take 10 mg by mouth daily.   Yes [provider]  aspirin 81 MG chewable tablet Chew 1 tablet (81 mg total) by mouth daily. 12/28/21  Yes Barb Merino, MD  atorvastatin (LIPITOR) 20 MG tablet Take 20 mg by mouth daily.   Yes [provider]  cholestyramine (QUESTRAN) 4 g packet Take 0.5 packets by mouth in the morning and at bedtime. 03/30/22  Yes [provider]  clopidogrel (PLAVIX) 75 MG tablet Take 75 mg by mouth daily.   Yes [provider]  [employer] DIGESTIVE PROBIOTIC 250 MG capsule Take 500 mg by mouth daily.  06/27/21  Yes [provider]  fluticasone (FLONASE) 50 MCG/ACT nasal spray Place 2 sprays into both nostrils daily as needed for allergies. 01/29/16  Yes [provider]  furosemide (LASIX) 20 MG tablet Take 20 mg by mouth daily. 08/06/21  Yes [provider]  hydrALAZINE (APRESOLINE) 100 MG tablet Take 1 tablet (100 mg total) by mouth every 8 (eight) hours. 04/09/22  Yes Thurnell Lose, MD  HYDROcodone-acetaminophen (NORCO) 10-325 MG tablet Take 1 tablet by mouth 4 (  four) times daily as needed for moderate pain. 04/01/22  Yes [provider]  levETIRAcetam (KEPPRA) 500 MG tablet TAKE 1 TABLET BY MOUTH TWICE A DAY Patient taking differently: Take 500 mg by mouth 2 (two) times daily. 01/20/20  Yes Dohmeier, Asencion Partridge, MD  losartan (COZAAR) 100 MG tablet Take 1 tablet (100 mg total) by mouth daily. Patient taking differently: Take 100 mg by mouth daily. Total of '150mg'$  12/28/21  Yes Ghimire, Dante Gang, MD  losartan (COZAAR) 50 MG tablet Take 50 mg by mouth daily. Total of 150 mg 03/05/22  Yes [provider]  magnesium oxide (MAG-OX) 400 MG tablet Take 1 tablet by mouth daily. 05/08/20  Yes [provider]  metoprolol succinate (TOPROL-XL) 100 MG 24 hr tablet Take 100 mg by mouth daily. 12/20/21  Yes [provider]  mirtazapine (REMERON) 15 MG tablet Take 15 mg by mouth at bedtime. 07/18/21  Yes [provider]  montelukast (SINGULAIR) 10 MG tablet Take 10 mg by mouth daily. 08/09/18  Yes [provider]  nitroGLYCERIN (NITROSTAT) 0.4 MG SL tablet Place 1 tablet (0.4 mg total) under the tongue every 5 (five) minutes as needed for chest pain. 11/08/15  Yes Martinique, Peter M, MD  ondansetron (ZOFRAN) 4 MG tablet Take 1 tablet (4 mg total) by mouth every 6 (six) hours as needed for nausea or vomiting. 10/24/21  Yes Esterwood, Amy S, PA-C  Pancrelipase, Lip-Prot-Amyl, (ZENPEP) 40000-126000 units CPEP Take 2 capsules with meals and one capsule  with snacks. Patient taking differently: Take 1 capsule by mouth daily with breakfast. 06/08/21  Yes Ladene Artist, MD  pantoprazole (PROTONIX) 40 MG tablet Take 1 tablet (40 mg total) by mouth 2 (two) times daily. 01/31/22  Yes Ladene Artist, MD  Polysaccharide-Iron Complex 150 MG CAPS Take 1 tablet by mouth daily.   Yes [provider]  triamterene-hydrochlorothiazide (MAXZIDE-25) 37.5-25 MG tablet Take 0.5 tablets by mouth every morning. 12/20/21  Yes [provider]  venlafaxine XR (EFFEXOR-XR) 75 MG 24 hr capsule Take 1 capsule (75 mg total) by mouth daily. 05/18/20  Yes [provider]  vitamin B-12 (CYANOCOBALAMIN) 500 MCG tablet Take 500 mcg by mouth daily.   Yes [provider]  Vitamin D, Ergocalciferol, (DRISDOL) 1.25 MG (50000 UT) CAPS capsule Take 50,000 Units by mouth every Wednesday.    Yes [provider]    Physical Exam: Vitals:   04/15/22 1145 04/15/22 1200 04/15/22 1215 04/15/22 1230  BP: (!) 200/91 (!) 208/75 (!) 216/77 (!) 193/71  Pulse: 83 84 86 84  Resp: '16 13 14 13  '$ Temp:      TempSrc:      SpO2: 97% 97% 97% 97%  Weight:      Height:       General:  Appears calm and comfortable and is in NAD Eyes:  EOMI, normal lids, iris ENT:  grossly normal hearing, lips; irregular tongue with R-sided deviation ?associated with prior trauma; mmm; appropriate dentition Neck:  no LAD, masses or thyromegaly Cardiovascular:  RRR, no m/r/g. No LE edema.  Respiratory:   CTA bilaterally with no wheezes/rales/rhonchi.  Normal respiratory effort. Abdomen:  soft, NT, ND; significant surgical wounds, remote and well healed Skin:  no rash or induration seen on limited exam Musculoskeletal:  grossly normal tone BUE/BLE, good ROM, no bony abnormality Psychiatric:  eccentric mood and affect, speech fluent and appropriate with some ?word finding difficulty, AOx3 Neurologic:  CN 2-12 grossly intact, moves all extremities in coordinated fashion,  sensation intact   Radiological Exams on Admission: Independently reviewed - see discussion in A/P where applicable  MR BRAIN WO CONTRAST  Result Date: 04/15/2022 CLINICAL DATA:  Neuro deficit, acute, stroke suspected EXAM: MRI HEAD WITHOUT CONTRAST TECHNIQUE: Multiplanar, multiecho pulse sequences of the brain and surrounding structures were obtained without intravenous contrast. COMPARISON:  04/08/2022 FINDINGS: Brain: Punctate focus of reduced diffusion is present in the right occipital lobe near the occipital horn. No evidence hemorrhage. Ventricles and sulci are stable in size and configuration. Patchy and confluent areas of T2 hyperintensity in the supratentorial and pontine white matter are nonspecific but probably reflects stable chronic microvascular ischemic changes. There is no intracranial mass or mass effect. No hydrocephalus or extra-axial collection. Vascular: Loss of the left ICA flow void to the clinoid is unchanged. Skull and upper cervical spine: Normal marrow signal is preserved. Sinuses/Orbits: No new finding. Other: Sella is partially empty.  Mastoid air cells are clear. IMPRESSION: Punctate right occipital acute infarct. Otherwise no significant change since recent prior study. Electronically Signed   By: Macy Mis M.D.   On: 04/15/2022 10:23   CT HEAD CODE STROKE WO CONTRAST  Result Date: 04/15/2022 CLINICAL DATA:  Code stroke.  Neuro deficit, acute, stroke suspected EXAM: CT HEAD WITHOUT CONTRAST TECHNIQUE: Contiguous axial images were obtained from the base of the skull through the vertex without intravenous contrast. RADIATION DOSE REDUCTION: This exam was performed according to the departmental dose-optimization program which includes automated exposure control, adjustment of the mA and/or kV according to patient size and/or use of iterative reconstruction technique. COMPARISON:  03/14/2022 FINDINGS: Brain: No acute intracranial hemorrhage, mass effect, or edema. No new  loss of gray-white differentiation. Ventricles and sulci are stable in size and configuration. Patchy hypoattenuation in the supratentorial white matter probably reflects stable chronic microvascular ischemic changes. No extra-axial collection. Vascular: No hyperdense vessel. Skull: Unremarkable. Sinuses/Orbits: No acute finding. Other: Mastoid air cells are clear. ASPECTS ([place] Stroke Program Early CT Score) - Ganglionic level infarction (caudate, lentiform nuclei, internal capsule, insula, M1-M3 cortex): 7 - Supraganglionic infarction (M4-M6 cortex): 3 Total score (0-10 with 10 being normal): 10 IMPRESSION: There is no acute intracranial hemorrhage or evidence of acute infarction. ASPECT score is 10. These results were communicated to Dr. Lorrin Goodell at 9:48 am on 04/15/2022 by text page via the [facility] messaging system. Electronically Signed   By: Macy Mis M.D.   On: 04/15/2022 09:57    EKG: Independently reviewed.  NSR with rate 81; nonspecific ST changes with no evidence of acute ischemia   Labs on Admission: I have personally reviewed the available labs and imaging studies at the time of the admission.  Pertinent labs:    Glucose 121 BUN 17/Creatinine 1.68/GFR 32 - stable Albumin 3.2 WBC 18.3 Hgb 11.4 INR 1.2 ETOH <10   Assessment and Plan: Principal Problem:   Acute CVA (cerebrovascular accident) ([place]) Active Problems:   Accelerated hypertension   Asthma   Diabetes due to underlying condition w oth circulatory comp ([place])   PVD (peripheral vascular disease) ([place])   CKD (chronic kidney disease) stage 3, GFR 30-59 ml/min   HLD (hyperlipidemia)   Obstructive apnea   Seizures ([place])   H/O ischemic bowel disease   Acute metabolic encephalopathy    Acute CVA -Patient with transient numbness of R forearm, R cheek, and aphasia - all of these symptoms have resolved -Also with some apparent confusion, headache, visual disturbance Concerning for TIA/CVA -Aspirin has been given to  reduce stroke  mortality and decrease morbidity -Will place in observation status for further evaluation -Telemetry monitoring -MRI shows punctate occipital CVA that would not explain her current symptoms -CTA head/neck pending -Echo done recently so will not repeat at this time -Risk stratification with FLP, A1c; will also check UDS -Patient has been on ASA and Plavix. Will defer DAPT decisions to neurology for now. -Neurology consult -PT/OT/ST/Nutrition Consults  Encephalopathy -Patient's symptoms are not fully explained by CVA appreciated on MRI -There may also be a component of encephalopathy -She does have a known seizure d/o -Will order EEG -Neurology is consulting  PVD/CAD -remote h/o L SFA stenting -Known L ICA occlusion, ? Symptomatic R ICA stenosis -She is being considered for transcarotid artery revascularization, last saw Dr. Virl Cagey on 6/30 -She is in the process of being scheduled for the procedure -Based on the current circumstances, I have notified Dr. Scot Dock (on call for vascular) in case this expedites the urgency for the procedure -ASA and Plavix are continued for now  HTN -Allow permissive HTN for now -Treat BP only if >220/120, and then with goal of 15% reduction -Hold amlodipine, hydralazine, losartan, metoprolol and Maxzide and plan to restart in 48-72 hours -Poorly controlled HTN at baseline, recently admitted for HTN crisis -Will add prn hydralazine for BP > 220/120   HLD -Check FLP -Continue Lipitor but increase to 40 mg daily   DM -Recent A1c shows good control -Not on home meds -Will order moderate-scale SSI  Stage 3b CKD -Appears to be stable at this time -Will follow, attempt to avoid nephrotoxic agents   Chronic pancreatitis -Hold cholestyramine, as patient failed swallow evaluation -Resume Creon with meals, when able  Seizure d/o -Continue Keppra -Order EEG  OSA -Not on CPAP  Chronic pain -I have reviewed this patient in the [place]  Controlled Substances Reporting System.  She is receiving medications from only one provider and appears to be taking them as prescribed. -She is at increased risk of opioid misuse, diversion, or overdose.  -Continue Norco  Mood d/o -Continue Remeron, Effexor  Asthma -Continue Singulair, albuterol    Advance Care Planning:   Code Status: Full Code   Consults: Neurology; Vascular surgery (notified of admission); PT/OT/ST; TOC team; nutrition  DVT Prophylaxis: Lovenox  Family Communication: None present; I spoke with her son at the time of admission  Severity of Illness: The appropriate patient status for this patient is OBSERVATION. Observation status is judged to be reasonable and necessary in order to provide the required intensity of service to ensure the patient's safety. The patient's presenting symptoms, physical exam findings, and initial radiographic and laboratory data in the context of their medical condition is felt to place them at decreased risk for further clinical deterioration. Furthermore, it is anticipated that the patient will be medically stable for discharge from the hospital within 2 midnights of admission.   Author: Karmen Bongo, MD 04/15/2022 1:54 PM  For on call review [email or web address].

## 2022-04-15 NOTE — ED Provider Notes (Signed)
[facility] EMERGENCY DEPARTMENT Provider Note   CSN: [id number] Arrival date & time: 04/15/22  8242  An emergency department physician performed an initial assessment on this suspected stroke patient at [phone].  History  Chief Complaint  Patient presents with   Code Stroke    [name] is a 76 y.o. female.  76 year old female with prior medical history as detailed below presents for evaluation.  Arrives from her PCPs office.  Code stroke initiated by EMS.  Patient was sent for evaluation of apparent episode of slurred speech and confusion.  Patient also complained of some right hand tingling and weakness.  Patient seen by code stroke team upon arrival.  Prior recent history includes UTI, history of carotid occlusion.    The history is provided by the patient and medical records.  Illness Location:  Slurred speech, confusion, right arm weakness Severity:  Moderate Onset quality:  Unable to specify Timing:  Unable to specify Progression:  Improving Chronicity:  New      Home Medications Prior to Admission medications   Medication Sig Start Date End Date Taking? Authorizing Provider  acetaminophen (TYLENOL) 325 MG tablet Take 650 mg by mouth every 6 (six) hours as needed for mild pain.   Yes [provider]  albuterol (VENTOLIN HFA) 108 (90 Base) MCG/ACT inhaler Inhale 1-2 puffs into the lungs every 6 (six) hours as needed for wheezing or shortness of breath. 03/30/21  Yes [provider]  amLODipine (NORVASC) 10 MG tablet Take 10 mg by mouth daily.   Yes [provider]  aspirin 81 MG chewable tablet Chew 1 tablet (81 mg total) by mouth daily. 12/28/21  Yes Barb Merino, MD  atorvastatin (LIPITOR) 20 MG tablet Take 20 mg by mouth daily.   Yes [provider]  cholestyramine (QUESTRAN) 4 g packet Take 0.5 packets by mouth in the morning and at bedtime. 03/30/22  Yes [provider]  clopidogrel (PLAVIX) 75 MG  tablet Take 75 mg by mouth daily.   Yes [provider]  [employer] DIGESTIVE PROBIOTIC 250 MG capsule Take 500 mg by mouth daily. 06/27/21  Yes [provider]  fluticasone (FLONASE) 50 MCG/ACT nasal spray Place 2 sprays into both nostrils daily as needed for allergies. 01/29/16  Yes [provider]  furosemide (LASIX) 20 MG tablet Take 20 mg by mouth daily. 08/06/21  Yes [provider]  hydrALAZINE (APRESOLINE) 100 MG tablet Take 1 tablet (100 mg total) by mouth every 8 (eight) hours. 04/09/22  Yes Thurnell Lose, MD  HYDROcodone-acetaminophen (NORCO) 10-325 MG tablet Take 1 tablet by mouth 4 (four) times daily as needed for moderate pain. 04/01/22  Yes [provider]  levETIRAcetam (KEPPRA) 500 MG tablet TAKE 1 TABLET BY MOUTH TWICE A DAY Patient taking differently: Take 500 mg by mouth 2 (two) times daily. 01/20/20  Yes Dohmeier, Asencion Partridge, MD  losartan (COZAAR) 100 MG tablet Take 1 tablet (100 mg total) by mouth daily. Patient taking differently: Take 100 mg by mouth daily. Total of '150mg'$  12/28/21  Yes Ghimire, Dante Gang, MD  losartan (COZAAR) 50 MG tablet Take 50 mg by mouth daily. Total of 150 mg 03/05/22  Yes [provider]  magnesium oxide (MAG-OX) 400 MG tablet Take 1 tablet by mouth daily. 05/08/20  Yes [provider]  metoprolol succinate (TOPROL-XL) 100 MG 24 hr tablet Take 100 mg by mouth daily. 12/20/21  Yes [provider]  mirtazapine (REMERON) 15 MG tablet Take 15 mg by  mouth at bedtime. 07/18/21  Yes [provider]  montelukast (SINGULAIR) 10 MG tablet Take 10 mg by mouth daily. 08/09/18  Yes [provider]  nitroGLYCERIN (NITROSTAT) 0.4 MG SL tablet Place 1 tablet (0.4 mg total) under the tongue every 5 (five) minutes as needed for chest pain. 11/08/15  Yes Martinique, Sanyia Dini M, MD  ondansetron (ZOFRAN) 4 MG tablet Take 1 tablet (4 mg total) by mouth every 6 (six) hours as needed for nausea or vomiting. 10/24/21   Yes Esterwood, Amy S, PA-C  Pancrelipase, Lip-Prot-Amyl, (ZENPEP) 40000-126000 units CPEP Take 2 capsules with meals and one capsule with snacks. Patient taking differently: Take 1 capsule by mouth daily with breakfast. 06/08/21  Yes Ladene Artist, MD  pantoprazole (PROTONIX) 40 MG tablet Take 1 tablet (40 mg total) by mouth 2 (two) times daily. 01/31/22  Yes Ladene Artist, MD  Polysaccharide-Iron Complex 150 MG CAPS Take 1 tablet by mouth daily.   Yes [provider]  triamterene-hydrochlorothiazide (MAXZIDE-25) 37.5-25 MG tablet Take 0.5 tablets by mouth every morning. 12/20/21  Yes [provider]  venlafaxine XR (EFFEXOR-XR) 75 MG 24 hr capsule Take 1 capsule (75 mg total) by mouth daily. 05/18/20  Yes [provider]  vitamin B-12 (CYANOCOBALAMIN) 500 MCG tablet Take 500 mcg by mouth daily.   Yes [provider]  Vitamin D, Ergocalciferol, (DRISDOL) 1.25 MG (50000 UT) CAPS capsule Take 50,000 Units by mouth every Wednesday.    Yes [provider]      Allergies    Adhesive [tape], Other, Ace inhibitors, Sulfa antibiotics, Topiramate, Codeine, and Iodinated contrast media    Review of Systems   Review of Systems  All other systems reviewed and are negative.   Physical Exam Updated Vital Signs BP (!) 198/70   Pulse 82   Temp 98.4 F (36.9 C) (Oral)   Resp 14   Ht '5\' 3"'$  (1.6 m)   Wt 82.8 kg   SpO2 94%   BMI 32.34 kg/m  Physical Exam Vitals and nursing note reviewed.  Constitutional:      General: She is not in acute distress.    Appearance: Normal appearance. She is well-developed.  HENT:     Head: Normocephalic and atraumatic.  Eyes:     Conjunctiva/sclera: Conjunctivae normal.     Pupils: Pupils are equal, round, and reactive to light.  Cardiovascular:     Rate and Rhythm: Normal rate and regular rhythm.     Heart sounds: Normal heart sounds.  Pulmonary:     Effort: Pulmonary effort is normal. No respiratory distress.      Breath sounds: Normal breath sounds.  Abdominal:     General: There is no distension.     Palpations: Abdomen is soft.     Tenderness: There is no abdominal tenderness.  Musculoskeletal:        General: No deformity. Normal range of motion.     Cervical back: Normal range of motion and neck supple.  Skin:    General: Skin is warm and dry.  Neurological:     General: No focal deficit present.     Mental Status: She is alert and oriented to person, place, and time. Mental status is at baseline.     Cranial Nerves: No cranial nerve deficit.     Sensory: No sensory deficit.     Motor: No weakness.     Coordination: Coordination normal.     ED Results / Procedures / Treatments   Labs (  all labs ordered are listed, but only abnormal results are displayed) Labs Reviewed  CBC - Abnormal; Notable for the following components:      Result Value   WBC 18.3 (*)    RBC 3.83 (*)    Hemoglobin 11.4 (*)    All other components within normal limits  DIFFERENTIAL - Abnormal; Notable for the following components:   Neutro Abs 13.9 (*)    Abs Immature Granulocytes 0.10 (*)    All other components within normal limits  COMPREHENSIVE METABOLIC PANEL - Abnormal; Notable for the following components:   CO2 19 (*)    Glucose, Bld 121 (*)    Creatinine, Ser 1.68 (*)    Calcium 8.6 (*)    Total Protein 6.3 (*)    Albumin 3.2 (*)    GFR, Estimated 32 (*)    All other components within normal limits  I-STAT CHEM 8, ED - Abnormal; Notable for the following components:   Creatinine, Ser 1.80 (*)    Glucose, Bld 114 (*)    Calcium, Ion 1.12 (*)    TCO2 20 (*)    All other components within normal limits  CBG MONITORING, ED - Abnormal; Notable for the following components:   Glucose-Capillary 116 (*)    All other components within normal limits  PROTIME-INR  APTT  ETHANOL  URINALYSIS, ROUTINE W REFLEX MICROSCOPIC    EKG EKG Interpretation  Date/Time:  Monday April 15 2022 10:21:59  EDT Ventricular Rate:  81 PR Interval:  151 QRS Duration: 104 QT Interval:  392 QTC Calculation: 455 R Axis:   68 Text Interpretation: Sinus rhythm Nonspecific T abnormalities, lateral leads Confirmed by Dene Gentry [phone]) on 04/15/2022 10:35:16 AM  Radiology MR BRAIN WO CONTRAST  Result Date: 04/15/2022 CLINICAL DATA:  Neuro deficit, acute, stroke suspected EXAM: MRI HEAD WITHOUT CONTRAST TECHNIQUE: Multiplanar, multiecho pulse sequences of the brain and surrounding structures were obtained without intravenous contrast. COMPARISON:  04/08/2022 FINDINGS: Brain: Punctate focus of reduced diffusion is present in the right occipital lobe near the occipital horn. No evidence hemorrhage. Ventricles and sulci are stable in size and configuration. Patchy and confluent areas of T2 hyperintensity in the supratentorial and pontine white matter are nonspecific but probably reflects stable chronic microvascular ischemic changes. There is no intracranial mass or mass effect. No hydrocephalus or extra-axial collection. Vascular: Loss of the left ICA flow void to the clinoid is unchanged. Skull and upper cervical spine: Normal marrow signal is preserved. Sinuses/Orbits: No new finding. Other: Sella is partially empty.  Mastoid air cells are clear. IMPRESSION: Punctate right occipital acute infarct. Otherwise no significant change since recent prior study. Electronically Signed   By: Macy Mis M.D.   On: 04/15/2022 10:23   CT HEAD CODE STROKE WO CONTRAST  Result Date: 04/15/2022 CLINICAL DATA:  Code stroke.  Neuro deficit, acute, stroke suspected EXAM: CT HEAD WITHOUT CONTRAST TECHNIQUE: Contiguous axial images were obtained from the base of the skull through the vertex without intravenous contrast. RADIATION DOSE REDUCTION: This exam was performed according to the departmental dose-optimization program which includes automated exposure control, adjustment of the mA and/or kV according to patient size and/or  use of iterative reconstruction technique. COMPARISON:  03/14/2022 FINDINGS: Brain: No acute intracranial hemorrhage, mass effect, or edema. No new loss of gray-white differentiation. Ventricles and sulci are stable in size and configuration. Patchy hypoattenuation in the supratentorial white matter probably reflects stable chronic microvascular ischemic changes. No extra-axial collection. Vascular: No hyperdense vessel. Skull: Unremarkable.  Sinuses/Orbits: No acute finding. Other: Mastoid air cells are clear. ASPECTS ([place] Stroke Program Early CT Score) - Ganglionic level infarction (caudate, lentiform nuclei, internal capsule, insula, M1-M3 cortex): 7 - Supraganglionic infarction (M4-M6 cortex): 3 Total score (0-10 with 10 being normal): 10 IMPRESSION: There is no acute intracranial hemorrhage or evidence of acute infarction. ASPECT score is 10. These results were communicated to Dr. Lorrin Goodell at 9:48 am on 04/15/2022 by text page via the [facility] messaging system. Electronically Signed   By: Macy Mis M.D.   On: 04/15/2022 09:57    Procedures Procedures    Medications Ordered in ED Medications  sodium chloride flush (NS) 0.9 % injection 3 mL (3 mLs Intravenous Not Given 04/15/22 1025)    ED Course/ Medical Decision Making/ A&P                           Medical Decision Making Amount and/or Complexity of Data Reviewed Labs: ordered. Radiology: ordered.  Risk Decision regarding hospitalization.    Medical Screen Complete  This patient presented to the ED with complaint of acute stroke -slurred speech, right arm weakness.  This complaint involves an extensive number of treatment options. The initial differential diagnosis includes, but is not limited to, acute stroke, metabolic abnormality, seizure, etc.  This presentation is: Acute, Self-Limited, Previously Undiagnosed, Uncertain Prognosis, Complicated, Systemic Symptoms, and Threat to Life/Bodily Function  Patient is arriving from  her PCP where she was noted to have acute slurred speech and confusion with associated weakness of the right arm.  Symptoms are improving with evaluation in the ED.  Code stroke team does not feel the patient requires acute neurologic intervention.  MRI does show small right acute occipital infarct.  This does not explain the patient's symptoms.  Patient will require admission for further work-up and treatment.  Hospitalist service made aware of case and will evaluate for admission  Co morbidities that complicated the patient's evaluation  History of ICA occlusion, seizures, stroke, diabetes, COPD, hypertension   Additional history obtained:  Additional history obtained from EMS External records from outside sources obtained and reviewed including prior ED visits and prior Inpatient records.    Lab Tests:  I ordered and personally interpreted labs.  The pertinent results include: CBC, CMP, coags   Imaging Studies ordered:  I ordered imaging studies including CT head I independently visualized and interpreted obtained imaging which showed NAD I agree with the radiologist interpretation.   Cardiac Monitoring:  The patient was maintained on a cardiac monitor.  I personally viewed and interpreted the cardiac monitor which showed an underlying rhythm of: NSR Problem List / ED Course:  Acute CVA   Reevaluation:  After the interventions noted above, I reevaluated the patient and found that they have: improved Disposition:  After consideration of the diagnostic results and the patients response to treatment, I feel that the patent would benefit from admission.   CRITICAL CARE Performed by: Valarie Merino   Total critical care time: 35 minutes  Critical care time was exclusive of separately billable procedures and treating other patients.  Critical care was necessary to treat or prevent imminent or life-threatening deterioration.  Critical care was time spent  personally by me on the following activities: development of treatment plan with patient and/or surrogate as well as nursing, discussions with consultants, evaluation of patient's response to treatment, examination of patient, obtaining history from patient or surrogate, ordering and performing treatments and interventions, ordering and  review of laboratory studies, ordering and review of radiographic studies, pulse oximetry and re-evaluation of patient's condition.           Final Clinical Impression(s) / ED Diagnoses Final diagnoses:  Cerebrovascular accident (CVA), unspecified mechanism ([place])    Rx / DC Orders ED Discharge Orders     None         Valarie Merino, MD 04/15/22 1132

## 2022-04-15 NOTE — ED Notes (Signed)
Pt c/o back pain at this time. Provided pt with prn meds at this time

## 2022-04-15 NOTE — Code Documentation (Signed)
Stroke Response Nurse Documentation Code Documentation  [name] is a 76 y.o. female arriving to [facility]  via [facility] on 04/15/2022 with past medical hx of anxiety, carotid artery disease with documented occlusion of left ICA and right ICA stenosis, clotting disorder, depression, diabetes, dyslipidemia, hypertension, fatty liver, GERD, heart murmur, chronic pancreatitis, PVD s/p stent of left SFA, seizure, sleep apnea, and stroke. On aspirin 81 mg daily and clopidogrel 75 mg daily. Code stroke was activated by EMS.   Patient from home where she was LKW at [place] and now complaining of expressive aphasia. Reports tingling in right fingers and intermittent blurry vision. 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   Stroke team at the bedside on patient arrival. Labs drawn and patient cleared for CT by EDP. Patient to CT with team. NIHSS 5, see documentation for details and code stroke times. Patient with disoriented, bilateral leg weakness, Expressive aphasia , and dysarthria  on exam. The following imaging was completed:  CT Head and MRI. Patient is not a candidate for IV Thrombolytic due to too mild to treat.  Care Plan: Q30 assessments while in window for tnk, then Q2.   Bedside handoff with ED RN Raquel Sarna.    Leverne Humbles Stroke Response RN

## 2022-04-15 NOTE — ED Notes (Signed)
Per [facility] admin prednisone '100mg'$  at this time to cover there 1330 dose and the 1930 dose hold benadryl till 1 hr before ct scan

## 2022-04-15 NOTE — ED Triage Notes (Signed)
Pt arrived to ED via EMS from PCP office. Code stroke activated at 0930 w/ the following s/s: slurred speech and AMS. Pt is on plavix and ASA. Pt has some slurred speech at baseline d/t tongue surgery. Pt was also reporting intermittent blurred vision and R hand tingling to EMS. Pt was seen here recently and diagnosed w/ a UTI and given IV abx and per report D/C'd without abx. PCP noted pt WBC today are elevated. Pt was also diagnosed when here w/ a L carotid occlusion. VSS w/ EMS other than BP 216/78.

## 2022-04-15 NOTE — ED Notes (Signed)
Pt is requesting something to help her sleep advised would need to speak with provider reference same. Provider on the chart sent a message about same

## 2022-04-15 NOTE — Evaluation (Addendum)
Clinical/Bedside Swallow Evaluation Patient Details  Name: [name] MRN: [id number] Date of Birth: Mar 07, 1946  Today's Date: 04/15/2022 Time: SLP Start Time (ACUTE ONLY): 6720 SLP Stop Time (ACUTE ONLY): 9470 SLP Time Calculation (min) (ACUTE ONLY): 22 min  Past Medical History:  Past Medical History:  Diagnosis Date   Anxiety    Arthritis    back    Blind loop syndrome    Clotting disorder (HCC)    Hx Clot in leg per pt    Colostomy present ([place])    Coronary artery disease    a. known 60-70% mid-LAD stenosis with caths in 1999, 2002, 2006, and 2012 showing stable anatomy b. 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[place])    Diabetes mellitus    Diverticulosis    Dizziness    chronic   Dyslipidemia    Fatty liver    GERD (gastroesophageal reflux disease)    hiatial hernia   Heart murmur    Hiatal hernia    Hypertension    Irritable bowel syndrome    Pancreatitis, chronic ([place])    Peripheral vascular disease ([place]) 02/20/2010   s/p stent of left SFA; carotid stenosis   Seizure ([place]) 11/04/2018   last 1 yr 2020 per pt unsure month    Sleep apnea    no cpap    Stroke ([place])    Thyroid nodule    Vitamin B12 deficiency    Past Surgical History:  Past Surgical History:  Procedure Laterality Date   ABDOMINAL HYSTERECTOMY     partial   AUGMENTATION MAMMAPLASTY     bilateral 1976 retro    BREAST ENHANCEMENT SURGERY     CARDIAC CATHETERIZATION  07/07/98;08/31/01;03/04/05   60 -70% LAD   CATARACT EXTRACTION Bilateral    COLON RESECTION N/A 10/14/2017   Procedure: EXPLORATORY LAPAROTOMY, EXTENDED RIGHT COLECTOMY; APPLICATION OF ABDOMINAL VACUUM DRESSING;  Surgeon: Jovita Kussmaul, MD;  Location: [facility];  Service: General;  Laterality: N/A;   COLONOSCOPY     ENDOBRONCHIAL ULTRASOUND Bilateral 02/24/2017   Procedure: ENDOBRONCHIAL ULTRASOUND;  Surgeon: Collene Gobble, MD;  Location: [facility] END[facility];  Service: Cardiopulmonary;  Laterality: Bilateral;    FEMORAL ARTERY STENT     Left leg   LAPAROTOMY N/A 10/16/2017   Procedure: EXPLORATORY LAPAROTOMY, PARTIAL OMENTECTOMY, RESECTION ISCHEMIC ILEUM 962EZ, APPLICATION OF VAC ABDOMINAL DRESSING;  Surgeon: Armandina Gemma, MD;  Location: [facility];  Service: General;  Laterality: N/A;   LAPAROTOMY N/A 10/18/2017   Procedure: RE-EXPLORATION OF ABDOMEN, ILEOSTOMY CREATION;  Surgeon: Stark Klein, MD;  Location: [facility];  Service: General;  Laterality: N/A;   LUNG SURGERY  03/2017   Benign polyps removed   PATELLA REALIGNMENT Left    POLYPECTOMY     SIGMOIDOSCOPY     TONSILLECTOMY     HPI:  [name] is a 76 y.o. 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   She went to the doctor and she got numb in her R arm.  She is supposed to have carotid surgery coming up (not yet set up) and saw her PCP about this today.  She also has an appointment with cardiology on Wednesday  She had a frontal headache today, no longer present significantly.  +visual blurriness.  +aphasia at the [place] office; MRI revealed on 7/3/23Punctate right occipital acute infarct.  Otherwise no significant  change since recent prior study; BSE generated    Assessment / Plan / Recommendation  Clinical Impression  Pt seen for a clinical swallowing evaluation with an essentially normal oropharyngeal swallow noted overall, but with speech being slightly dysarthric.  Pt denotes lingual trauma from previous hospitalization (approximately 4 years ago) with decreased ROM noted for lingual elevation with tip of tongue during OME.  Midline fissure/abnormality observed which may be impacting speech intelligibility and potentially swallowing function within oral prep phase.  No overt s/s of aspiration noted with thin, puree and solid consistencies during evaluation.  Min  impaired mastication with solid with pt stating she eats preferred foods that are easier to consume at home d/t lingual issue, but they are of regular consistency.  Recommend initiating a Regular/thin liquid diet with ST f/u for diet tolerance in acute setting.  SLE pending.  SLP Visit Diagnosis: Dysphagia, unspecified (R13.10)    Aspiration Risk  Mild aspiration risk    Diet Recommendation   Regular/thin liquids  Medication Administration: Whole meds with liquid    Other  Recommendations Oral Care Recommendations: Oral care BID    Recommendations for follow up therapy are one component of a multi-disciplinary discharge planning process, led by the attending physician.  Recommendations may be updated based on patient status, additional functional criteria and insurance authorization.  Follow up Recommendations Follow physician's recommendations for discharge plan and follow up therapies      Assistance Recommended at Discharge Intermittent Supervision/Assistance  Functional Status Assessment Patient has had a recent decline in their functional status and demonstrates the ability to make significant improvements in function in a reasonable and predictable amount of time.  Frequency and Duration min 1 x/week  1 week       Prognosis Prognosis for Safe Diet Advancement: Good      Swallow Study   General Date of Onset: 04/15/22 HPI: [name] is a 76 y.o. 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   She went to the doctor and she got numb in her R arm.  She is supposed to have carotid surgery coming up (not yet set up) and saw her PCP about this today.  She also has an appointment with cardiology on Wednesday  She had a frontal headache today, no longer present significantly.  +visual blurriness.   +aphasia at the [place] office; MRI revealed on 7/3/23Punctate right occipital acute infarct. Otherwise no significant  change since recent prior study; BSE generated Type of Study: Bedside Swallow Evaluation Previous Swallow Assessment: [facility] failed per nursing report. Diet Prior to this Study: Regular;Thin liquids Temperature Spikes Noted: No Respiratory Status: Room air History of Recent Intubation: No Behavior/Cognition: Alert;Cooperative Oral Cavity Assessment: Within Functional Limits Oral Care Completed by SLP: Recent completion by staff Oral Cavity - Dentition: Adequate natural dentition Vision: Functional for self-feeding Self-Feeding Abilities: Able to feed self Patient Positioning: Upright in bed Baseline Vocal Quality: Hoarse;Other (comment) (min d/t xerostomia) Volitional Cough: Strong Volitional Swallow: Able to elicit    Oral/Motor/Sensory Function Overall Oral Motor/Sensory Function: Other (comment) (lingual trauma from prolonged hospitalization per pt report; speech min dysarthric)   Ice Chips Ice chips: Within functional limits Presentation: Spoon   Thin Liquid Thin Liquid: Within functional limits Presentation: Cup;Straw;Self Fed    Nectar Thick Nectar Thick Liquid: Not tested   Honey Thick  Honey Thick Liquid: Not tested   Puree Puree: Within functional limits Presentation: Self Fed   Solid     Solid: Impaired Presentation: Self Fed Oral Phase Impairments: Impaired mastication Oral Phase Functional Implications: Impaired mastication      Elvina Sidle, M.S., CCC-SLP 04/15/2022,3:46 PM

## 2022-04-15 NOTE — Evaluation (Signed)
Occupational Therapy Evaluation Patient Details Name: [name] MRN: [id number] DOB: 14-Jan-1946 Today's Date: 04/15/2022   History of Present Illness [name] is a 76 y.o. female with MRI does show small right acute occipital infarct.  Medical history significant of CAD; DM; HLD; HTN; PVD; chronic pancreatitis; ischemic bowel with colectomy/colostomy with subsequent reversal; seizure; OSA not on CPAP; and CVA presenting with stroke like symptoms.   She went to the doctor and she got numb in her R arm.  She is supposed to have carotid surgery coming up (not yet set up) and saw her PCP about this today.   Clinical Impression   Pt currently completes selfcare tasks at min guard assist overall sit to stand.  Feel she will benefit from acute care OT to progress back to modified independent to independent level for return home.  Do no anticipate any follow-up OT but will update as needed.        Recommendations for follow up therapy are one component of a multi-disciplinary discharge planning process, led by the attending physician.  Recommendations may be updated based on patient status, additional functional criteria and insurance authorization.   Follow Up Recommendations  No OT follow up    Assistance Recommended at Discharge PRN  Patient can return home with the following A little help with walking and/or transfers;A little help with bathing/dressing/bathroom;Assist for transportation;Help with stairs or ramp for entrance    Functional Status Assessment  Patient has had a recent decline in their functional status and demonstrates the ability to make significant improvements in function in a reasonable and predictable amount of time.  Equipment Recommendations  None recommended by OT       Precautions / Restrictions Precautions Precautions: Fall Restrictions Weight Bearing Restrictions: No      Mobility Bed Mobility Overal bed mobility: Modified Independent                   Transfers Overall transfer level: Needs assistance Equipment used: None Transfers: Sit to/from Stand, Bed to chair/wheelchair/BSC Sit to Stand: Min guard     Step pivot transfers: Min guard     General transfer comment: Pt with report of increased left knee pain since fall a few months ago.  Slight difficulty with the first couple of steps but improved with ambulation to and from the sink in the room.  Limited mobility secondary to BP.      Balance Overall balance assessment: Needs assistance Sitting-balance support: Feet supported Sitting balance-Leahy Scale: Good     Standing balance support: No upper extremity supported, During functional activity Standing balance-Leahy Scale: Fair                             ADL either performed or assessed with clinical judgement   ADL Overall ADL's : Needs assistance/impaired Eating/Feeding: Independent;Sitting   Grooming: Wash/dry face;Wash/dry hands;Supervision/safety;Standing Grooming Details (indicate cue type and reason): simulated Upper Body Bathing: Set up;Sitting   Lower Body Bathing: Min guard;Sit to/from stand   Upper Body Dressing : Min guard;Sitting       Toilet Transfer: Min guard;Ambulation Toilet Transfer Details (indicate cue type and reason): simulated Toileting- Clothing Manipulation and Hygiene: Min guard;Sit to/from stand       Functional mobility during ADLs: Min guard General ADL Comments: Pt with elevated BP so limited mobility secondary to this with BP in sitting at 209/77 and 204/92.     Vision Baseline Vision/History:  1 Wears glasses Ability to See in Adequate Light: 0 Adequate Patient Visual Report: No change from baseline Vision Assessment?: Yes Eye Alignment: Within Functional Limits Ocular Range of Motion: Within Functional Limits Alignment/Gaze Preference: Within Defined Limits Tracking/Visual Pursuits: Able to track stimulus in all quads without  difficulty Convergence: Impaired (comment) (decreased convergence in the right eye) Visual Fields: No apparent deficits     Perception Perception Perception: Within Functional Limits   Praxis Praxis Praxis: Intact    Pertinent Vitals/Pain Pain Assessment Pain Assessment: No/denies pain     Hand Dominance Right   Extremity/Trunk Assessment Upper Extremity Assessment Upper Extremity Assessment: Overall WFL for tasks assessed (strength 3+/5 throughout, sensation intact)   Lower Extremity Assessment Lower Extremity Assessment: Defer to PT evaluation   Cervical / Trunk Assessment Cervical / Trunk Assessment: Normal   Communication Communication Communication: Other (comment)   Cognition Arousal/Alertness: Awake/alert Behavior During Therapy: WFL for tasks assessed/performed Overall Cognitive Status: Within Functional Limits for tasks assessed                                                  Home Living Family/patient expects to be discharged to:: Private residence Living Arrangements: Alone Available Help at Discharge: Family;Available PRN/intermittently (son and daughter in law close by) Type of Home: House Home Access: Stairs to enter [employer] of Steps: 2 Entrance Stairs-Rails: Right;Left       Bathroom Shower/Tub: Teacher, early years/pre: Standard     Home Equipment: Advice worker (2 wheels)          Prior Functioning/Environment Prior Level of Function : Independent/Modified Independent;Driving             Mobility Comments: pt reports being indep without AD, driving, grocery shopping ADLs Comments: independent        OT Problem List: Impaired balance (sitting and/or standing);Decreased knowledge of use of DME or AE      OT Treatment/Interventions: Self-care/ADL training;DME and/or AE instruction;Therapeutic activities;Balance training;Patient/family education;Therapeutic  exercise;Neuromuscular education    OT Goals(Current goals can be found in the care plan section) Acute Rehab OT Goals Patient Stated Goal: Pt wants to get back to what she was doing prior to this admission. OT Goal Formulation: With patient Time For Goal Achievement: 04/29/22 Potential to Achieve Goals: Good  OT Frequency: Min 2X/week       AM-PAC OT "6 Clicks" Daily Activity     Outcome Measure Help from another person eating meals?: None Help from another person taking care of personal grooming?: A Little Help from another person toileting, which includes using toliet, bedpan, or urinal?: A Little Help from another person bathing (including washing, rinsing, drying)?: A Little Help from another person to put on and taking off regular upper body clothing?: None Help from another person to put on and taking off regular lower body clothing?: A Little 6 Click Score: 20   End of Session Nurse Communication: Mobility status  Activity Tolerance: Patient tolerated treatment well Patient left: in bed;with call bell/phone within reach  OT Visit Diagnosis: Unsteadiness on feet (R26.81);Muscle weakness (generalized) (M62.81)                Time: 2774-1287 OT Time Calculation (min): 32 min Charges:  OT General Charges $OT Visit: 1 Visit OT Evaluation $OT Eval Moderate Complexity: 1 Mod OT Treatments $Self  Care/Home Management : 8-22 mins  Tomeika Weinmann OTR/L 04/15/2022, 4:10 PM

## 2022-04-15 NOTE — Consult Note (Signed)
NEUROLOGY CONSULTATION NOTE   Date of service: April 15, 2022 Patient Name: [name] MRN:  [id number] DOB:  [birth year] Reason for consult: "Aphasia" Requesting Provider: No att. providers found _ _ _   _ __   _ __ _ _  __ __   _ __   __ _  History of Present Illness  [name] is a 76 y.o. 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  She woke up this AM at 0730 and felt fine. At the doctors office today at 0830, had acute onset speech difficulty. EMS called and she was brought in as a stroke code for concern for aphasia and dysarthric speech. She also reports tingling in R fingers and intermittent blurry vision  On evaluation, unclear if she has aphasia vs encephalopathy. Sensation in intact throughout, no visual abnormalities. 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   LKW: 0830 on 04/15/22. mRS: 0 tNKASE/Thrombectomy: not offered, symptoms felt to be too mild, MRI Brain with a punctate R occipital stroke which would not explain her presentation. 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  NIHSS components Score: Comment  1a Level of Conscious 0'[x]'$  1'[]'$  2'[]'$  3'[]'$      1b LOC Questions 0'[]'$  1'[x]'$  2'[]'$       1c LOC Commands 0'[x]'$  1'[]'$  2'[]'$       2 Best Gaze 0'[x]'$  1'[]'$  2'[]'$       3 Visual 0'[x]'$  1'[]'$  2'[]'$  3'[]'$      4 Facial Palsy 0'[x]'$  1'[]'$  2'[]'$  3'[]'$      5a Motor Arm - left 0'[x]'$  1'[]'$  2'[]'$  3'[]'$  4'[]'$  UN'[]'$    5b Motor Arm - Right 0'[x]'$  1'[]'$  2'[]'$  3'[]'$  4'[]'$  UN'[]'$    6a Motor Leg - Left 0'[]'$  1'[x]'$  2'[]'$  3'[]'$  4'[]'$  UN'[]'$    6b Motor Leg - Right 0'[]'$  1'[x]'$  2'[]'$  3'[]'$  4'[]'$  UN'[]'$    7 Limb Ataxia 0'[x]'$  1'[]'$  2'[]'$  3'[]'$  UN'[]'$      8 Sensory 0'[x]'$  1'[]'$  2'[]'$  UN'[]'$      9 Best Language 0'[]'$  1'[x]'$  2'[]'$  3'[]'$      10 Dysarthria 0'[]'$  1'[x]'$  2'[]'$  UN'[]'$    Baseline dysarthria  11 Extinct. and Inattention 05 1'[]'$  2'[]'$       TOTAL:      ROS   Difficult to get a detailed ROS due to encephalopathy.  Past History   Past Medical History:  Diagnosis Date   Allergy    Anemia    past hx per pt    Anxiety    Arthritis    back    Arthropathy, unspecified, site unspecified    Blind loop syndrome    Carotid artery disease ([place])    documented occlusion of left ICA   Cataract    removed both eyes    Clotting disorder ([facility])    Hx Clot in leg per pt    Colostomy present ([place])    Coronary artery disease    a. known 60-70% mid-LAD stenosis with caths in 1999, 2002, 2006, and 2012 showing stable anatomy b. 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[place])    Diabetes mellitus  Diverticulosis    Dizziness    chronic   Dyslipidemia    Dyspnea    with exertion   Fatty liver    GERD (gastroesophageal reflux disease)    hiatial hernia   Heart murmur    Hepatomegaly    Hiatal hernia    HTN (hypertension)    Hyperlipidemia    Hypertension    Irritable bowel syndrome    Melanosis coli    Metatarsal fracture right   Normal nuclear stress test    nml 01/03/10;06/19/07   Other, mixed, or unspecified nondependent drug abuse, unspecified    Pancreatitis, chronic ([place])    Peripheral vascular disease ([place]) 02/20/10   s/p stent of left SFA   Seizure ([place]) 11/04/2018   last 1 yr 2020 per pt unsure month    Sleep apnea    no cpap    Stroke [facility])    Thyroid nodule    Unspecified essential hypertension    Vitamin B12 deficiency    Past Surgical History:  Procedure Laterality Date   ABDOMINAL HYSTERECTOMY     partial   AUGMENTATION MAMMAPLASTY     bilateral 1976 retro    BREAST ENHANCEMENT SURGERY     CARDIAC CATHETERIZATION  07/07/98;08/31/01;03/04/05   60 -70% LAD   CATARACT EXTRACTION Bilateral    COLON RESECTION N/A  10/14/2017   Procedure: EXPLORATORY LAPAROTOMY, EXTENDED RIGHT COLECTOMY; APPLICATION OF ABDOMINAL VACUUM DRESSING;  Surgeon: Jovita Kussmaul, MD;  Location: [facility];  Service: General;  Laterality: N/A;   COLONOSCOPY     ENDOBRONCHIAL ULTRASOUND Bilateral 02/24/2017   Procedure: ENDOBRONCHIAL ULTRASOUND;  Surgeon: Collene Gobble, MD;  Location: [facility] END[facility];  Service: Cardiopulmonary;  Laterality: Bilateral;   FEMORAL ARTERY STENT     Left leg   LAPAROTOMY N/A 10/16/2017   Procedure: EXPLORATORY LAPAROTOMY, PARTIAL OMENTECTOMY, RESECTION ISCHEMIC ILEUM 517OH, APPLICATION OF VAC ABDOMINAL DRESSING;  Surgeon: Armandina Gemma, MD;  Location: [facility];  Service: General;  Laterality: N/A;   LAPAROTOMY N/A 10/18/2017   Procedure: RE-EXPLORATION OF ABDOMEN, ILEOSTOMY CREATION;  Surgeon: Stark Klein, MD;  Location: [facility];  Service: General;  Laterality: N/A;   LUNG SURGERY  03/2017   Benign polyps removed   PATELLA REALIGNMENT Left    POLYPECTOMY     SIGMOIDOSCOPY     TONSILLECTOMY     Family History  Problem Relation Age of Onset   Hypertension Mother    Osteoarthritis Mother    Pulmonary embolism Mother    Hypertension Father        aneurysm   Stroke Father    Colon cancer Paternal Grandfather    Stroke Other    Breast cancer Neg Hx    Colon polyps Neg Hx    Esophageal cancer Neg Hx    Rectal cancer Neg Hx    Stomach cancer Neg Hx    Pancreatic cancer Neg Hx    Liver disease Neg Hx    Social History   Socioeconomic History   Marital status: Divorced    Spouse name: Not on file   Number of children: 2   Years of education: Not on file   Highest education level: Not on file  Occupational History   Occupation: retired    Fish farm manager: DISABLED  Tobacco Use   Smoking status: Former    Packs/day: 1.00    Years: 50.00    Total pack years: 50.00    Types: Cigarettes    Quit date: 10/14/2010    Years since quitting:  11.5   Smokeless tobacco: Never  Vaping Use   Vaping Use: Never  used  Substance and Sexual Activity   Alcohol use: No   Drug use: No   Sexual activity: Not on file  Other Topics Concern   Not on file  Social History Narrative   Not on file   Social Determinants of Health   Financial Resource Strain: Not on file  Food Insecurity: Not on file  Transportation Needs: Not on file  Physical Activity: Not on file  Stress: Not on file  Social Connections: Not on file   Allergies  Allergen Reactions   Adhesive [Tape] Other (See Comments)    "Took off my skin"   Other Rash    "Took off my skin"   Ace Inhibitors Other (See Comments)    Unknown   Sulfa Antibiotics Other (See Comments)    Unknown   Topiramate Other (See Comments)   Codeine Other (See Comments)    Altered mental state    Iodinated Contrast Media Rash    Medications  (Not in a hospital admission)    Vitals   Vitals:   04/15/22 0900  Weight: 82.8 kg  Height: '5\' 3"'$  (1.6 m)     Body mass index is 32.34 kg/m.  Physical Exam   General: Laying comfortably in bed; in no acute distress.  HENT: Normal oropharynx and mucosa. Normal external appearance of ears and nose.  Neck: Supple, no pain or tenderness  CV: No JVD. No peripheral edema.  Pulmonary: Symmetric Chest rise. Normal respiratory effort.  Abdomen: Soft to touch, non-tender.  Ext: No cyanosis, edema, or deformity  Skin: No rash. Normal palpation of skin.   Musculoskeletal: Normal digits and nails by inspection. No clubbing.   Neurologic Examination  Mental status/Cognition: Alert, oriented to self, place, month and year, not oriented to her age. Poor attention.  Speech/language: Fluent, comprehension intact, able to name some but not all objects, repetition intact. Cranial nerves:   CN II Pupils equal and reactive to light, no VF deficits    CN III,IV,VI EOM intact, no gaze preference or deviation, no nystagmus    CN V normal sensation in V1, V2, and V3 segments bilaterally    CN VII no asymmetry, no  nasolabial fold flattening    CN VIII normal hearing to speech    CN IX & X normal palatal elevation, no uvular deviation    CN XI 5/5 head turn and 5/5 shoulder shrug bilaterally    CN XII midline tongue protrusion    Motor:  Muscle bulk: poor, tone normal, pronator drift none Mvmt Root Nerve  Muscle Right Left Comments  SA C5/6 Ax Deltoid     EF C5/6 Mc Biceps 5 5   EE C6/7/8 Rad Triceps 5 5   WF C6/7 Med FCR     WE C7/8 PIN ECU     F Ab C8/T1 U ADM/FDI 5 5   HF L1/2/3 Fem Illopsoas 4 4   KE L2/3/4 Fem Quad     DF L4/5 D Peron Tib Ant     PF S1/2 Tibial Grc/Sol      Sensation:  Light touch Intact throughout   Pin prick    Temperature    Vibration   Proprioception    Coordination/Complex Motor:  - Finger to Nose intact BL - Heel to shin unable to do - Rapid alternating movement are slowed. - Gait: Deferred for patient safety.  Labs   CBC:  Recent Labs  Lab 04/08/22 1709 04/09/22 0344 04/15/22 0946  WBC 11.8* 9.2  --   NEUTROABS 7.8*  --   --   HGB 11.9* 11.5* 12.2  HCT 38.7 35.0* 36.0  MCV 94.4 93.1  --   PLT 247 213  --     Basic Metabolic Panel:  Lab Results  Component Value Date   NA 135 04/15/2022   K 3.9 04/15/2022   CO2 18 (L) 04/09/2022   GLUCOSE 114 (H) 04/15/2022   BUN 19 04/15/2022   CREATININE 1.80 (H) 04/15/2022   CALCIUM 8.5 (L) 04/09/2022   GFRNONAA 30 (L) 04/09/2022   GFRAA 30 (L) 10/21/2019   Lipid Panel:  Lab Results  Component Value Date   LDLCALC 25 12/26/2021   HgbA1c:  Lab Results  Component Value Date   HGBA1C 5.9 (H) 12/26/2021   Urine Drug Screen:     Component Value Date/Time   LABOPIA NONE DETECTED 10/31/2018 2217   COCAINSCRNUR NONE DETECTED 10/31/2018 2217   LABBENZ POSITIVE (A) 10/31/2018 2217   AMPHETMU NONE DETECTED 10/31/2018 2217   THCU NONE DETECTED 10/31/2018 2217   LABBARB NONE DETECTED 10/31/2018 2217    Alcohol Level     Component Value Date/Time   ETH <10 10/15/2019 2123    CT Head without  contrast(Personally reviewed): CTH was negative for a large hypodensity concerning for a large territory infarct or hyperdensity concerning for an ICH  CT angio head and neck: pending  MRI Brain(Personally reviewed): R occipital punctate stroke.  rEEG:  pending  Impression   [name] is a 76 y.o. 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 Has a punctate stroke in the R occipital lobe on the MRI that does not explain her presentation and thus tNKASE/thrombectomy not offered.   Differential includes seizure with epileptic aphasia, symptomatic carotid occlusion/stenosis.  Will get a routine EEG in addition to stroke workup, Keppra levels and continue Keppra '500mg'$  BID.   Recommendations   R occipital punctate stroke: - Frequent Neuro checks per stroke unit protocol - Recommend Vascular imaging with CT Angio head and neck - Hold off on TTE, recently done and nonrevealing. - Recommend obtaining Lipid panel with LDL - Please start statin if LDL > 70 - Recommend HbA1c - Antithrombotic - Aspirin '81mg'$  daily along with plavix '75mg'$  daily. - Recommend DVT ppx - SBP goal - permissive hypertension first 24 h < 220/110. Held home meds.  - Recommend Telemetry monitoring for arrythmia - Recommend bedside swallow screen prior to PO intake. - Stroke education booklet - Recommend PT/OT/SLP consult  Aphasia vs Encephalopathy: - routine EEG - continue Keppra '500mg'$  BID for now. Even if this was a seizure, provoked in the setting of a UTI. - Keppra levels. - metabolic workup with CBC, chemistry, UA, CXR.  ______________________________________________________________________   Thank you for the opportunity to take part in the care of this  patient. If you have any further questions, please contact the neurology consultation attending.  Signed,  [place] Pager Number [phone] _ _ _   _ __   _ __ _ _  __ __   _ __   __ _

## 2022-04-15 NOTE — ED Notes (Signed)
Received verbal report from [place] at this time

## 2022-04-16 ENCOUNTER — Observation Stay (HOSPITAL_COMMUNITY): Payer: Medicare HMO

## 2022-04-16 DIAGNOSIS — G4730 Sleep apnea, unspecified: Secondary | ICD-10-CM | POA: Diagnosis not present

## 2022-04-16 DIAGNOSIS — F39 Unspecified mood [affective] disorder: Secondary | ICD-10-CM | POA: Diagnosis present

## 2022-04-16 DIAGNOSIS — G8929 Other chronic pain: Secondary | ICD-10-CM | POA: Diagnosis present

## 2022-04-16 DIAGNOSIS — I63231 Cerebral infarction due to unspecified occlusion or stenosis of right carotid arteries: Secondary | ICD-10-CM | POA: Diagnosis present

## 2022-04-16 DIAGNOSIS — E0859 Diabetes mellitus due to underlying condition with other circulatory complications: Secondary | ICD-10-CM | POA: Diagnosis not present

## 2022-04-16 DIAGNOSIS — K8681 Exocrine pancreatic insufficiency: Secondary | ICD-10-CM | POA: Diagnosis present

## 2022-04-16 DIAGNOSIS — I700 Atherosclerosis of aorta: Secondary | ICD-10-CM | POA: Diagnosis present

## 2022-04-16 DIAGNOSIS — R569 Unspecified convulsions: Secondary | ICD-10-CM

## 2022-04-16 DIAGNOSIS — I6503 Occlusion and stenosis of bilateral vertebral arteries: Secondary | ICD-10-CM | POA: Diagnosis not present

## 2022-04-16 DIAGNOSIS — G9341 Metabolic encephalopathy: Secondary | ICD-10-CM | POA: Diagnosis present

## 2022-04-16 DIAGNOSIS — I10 Essential (primary) hypertension: Secondary | ICD-10-CM | POA: Diagnosis not present

## 2022-04-16 DIAGNOSIS — E785 Hyperlipidemia, unspecified: Secondary | ICD-10-CM | POA: Diagnosis present

## 2022-04-16 DIAGNOSIS — E538 Deficiency of other specified B group vitamins: Secondary | ICD-10-CM | POA: Diagnosis present

## 2022-04-16 DIAGNOSIS — J432 Centrilobular emphysema: Secondary | ICD-10-CM | POA: Diagnosis present

## 2022-04-16 DIAGNOSIS — K861 Other chronic pancreatitis: Secondary | ICD-10-CM | POA: Diagnosis present

## 2022-04-16 DIAGNOSIS — K760 Fatty (change of) liver, not elsewhere classified: Secondary | ICD-10-CM | POA: Diagnosis present

## 2022-04-16 DIAGNOSIS — I129 Hypertensive chronic kidney disease with stage 1 through stage 4 chronic kidney disease, or unspecified chronic kidney disease: Secondary | ICD-10-CM | POA: Diagnosis present

## 2022-04-16 DIAGNOSIS — I6521 Occlusion and stenosis of right carotid artery: Secondary | ICD-10-CM | POA: Diagnosis not present

## 2022-04-16 DIAGNOSIS — J449 Chronic obstructive pulmonary disease, unspecified: Secondary | ICD-10-CM | POA: Diagnosis not present

## 2022-04-16 DIAGNOSIS — E782 Mixed hyperlipidemia: Secondary | ICD-10-CM | POA: Diagnosis not present

## 2022-04-16 DIAGNOSIS — E1122 Type 2 diabetes mellitus with diabetic chronic kidney disease: Secondary | ICD-10-CM | POA: Diagnosis not present

## 2022-04-16 DIAGNOSIS — G4733 Obstructive sleep apnea (adult) (pediatric): Secondary | ICD-10-CM | POA: Diagnosis not present

## 2022-04-16 DIAGNOSIS — N189 Chronic kidney disease, unspecified: Secondary | ICD-10-CM | POA: Diagnosis not present

## 2022-04-16 DIAGNOSIS — K589 Irritable bowel syndrome without diarrhea: Secondary | ICD-10-CM | POA: Diagnosis present

## 2022-04-16 DIAGNOSIS — I6501 Occlusion and stenosis of right vertebral artery: Secondary | ICD-10-CM | POA: Diagnosis not present

## 2022-04-16 DIAGNOSIS — I639 Cerebral infarction, unspecified: Secondary | ICD-10-CM | POA: Diagnosis present

## 2022-04-16 DIAGNOSIS — K902 Blind loop syndrome, not elsewhere classified: Secondary | ICD-10-CM | POA: Diagnosis present

## 2022-04-16 DIAGNOSIS — I130 Hypertensive heart and chronic kidney disease with heart failure and stage 1 through stage 4 chronic kidney disease, or unspecified chronic kidney disease: Secondary | ICD-10-CM | POA: Diagnosis not present

## 2022-04-16 DIAGNOSIS — N183 Chronic kidney disease, stage 3 unspecified: Secondary | ICD-10-CM | POA: Diagnosis not present

## 2022-04-16 DIAGNOSIS — I63233 Cerebral infarction due to unspecified occlusion or stenosis of bilateral carotid arteries: Secondary | ICD-10-CM | POA: Diagnosis not present

## 2022-04-16 DIAGNOSIS — I672 Cerebral atherosclerosis: Secondary | ICD-10-CM | POA: Diagnosis not present

## 2022-04-16 DIAGNOSIS — K219 Gastro-esophageal reflux disease without esophagitis: Secondary | ICD-10-CM | POA: Diagnosis present

## 2022-04-16 DIAGNOSIS — G40909 Epilepsy, unspecified, not intractable, without status epilepticus: Secondary | ICD-10-CM | POA: Diagnosis present

## 2022-04-16 DIAGNOSIS — M5030 Other cervical disc degeneration, unspecified cervical region: Secondary | ICD-10-CM | POA: Diagnosis not present

## 2022-04-16 DIAGNOSIS — I2510 Atherosclerotic heart disease of native coronary artery without angina pectoris: Secondary | ICD-10-CM | POA: Diagnosis present

## 2022-04-16 DIAGNOSIS — I739 Peripheral vascular disease, unspecified: Secondary | ICD-10-CM | POA: Diagnosis not present

## 2022-04-16 DIAGNOSIS — R29705 NIHSS score 5: Secondary | ICD-10-CM | POA: Diagnosis present

## 2022-04-16 DIAGNOSIS — R4701 Aphasia: Secondary | ICD-10-CM | POA: Diagnosis present

## 2022-04-16 DIAGNOSIS — E669 Obesity, unspecified: Secondary | ICD-10-CM | POA: Diagnosis present

## 2022-04-16 DIAGNOSIS — Z87891 Personal history of nicotine dependence: Secondary | ICD-10-CM | POA: Diagnosis not present

## 2022-04-16 DIAGNOSIS — I509 Heart failure, unspecified: Secondary | ICD-10-CM | POA: Diagnosis not present

## 2022-04-16 DIAGNOSIS — N1832 Chronic kidney disease, stage 3b: Secondary | ICD-10-CM | POA: Diagnosis present

## 2022-04-16 DIAGNOSIS — I69351 Hemiplegia and hemiparesis following cerebral infarction affecting right dominant side: Secondary | ICD-10-CM | POA: Diagnosis not present

## 2022-04-16 DIAGNOSIS — Z8719 Personal history of other diseases of the digestive system: Secondary | ICD-10-CM | POA: Diagnosis not present

## 2022-04-16 LAB — CBG MONITORING, ED
Glucose-Capillary: 196 mg/dL — ABNORMAL HIGH (ref 70–99)
Glucose-Capillary: 202 mg/dL — ABNORMAL HIGH (ref 70–99)
Glucose-Capillary: 201 mg/dL — ABNORMAL HIGH (ref 70–99)

## 2022-04-16 LAB — LIPID PANEL
Cholesterol: 108 mg/dL (ref 0–200)
HDL: 62 mg/dL (ref >40)
LDL Cholesterol: 28 mg/dL (ref 0–99)
Total CHOL/HDL Ratio: 1.7 RATIO
Triglycerides: 90 mg/dL (ref <150)
VLDL: 18 mg/dL (ref 0–40)

## 2022-04-16 LAB — HEMOGLOBIN A1C
Hgb A1c MFr Bld: 6.2 % — ABNORMAL HIGH (ref 4.8–5.6)
Mean Plasma Glucose: 131.24 mg/dL

## 2022-04-16 MED ORDER — IOHEXOL 350 MG/ML SOLN
100.0000 mL | Freq: Once | INTRAVENOUS | Status: AC | PRN
  Administered 2022-04-16: 40 mL via INTRAVENOUS

## 2022-04-16 NOTE — ED Notes (Signed)
Pt ambulated to restroom with a steady gait

## 2022-04-16 NOTE — ED Notes (Signed)
Pt ambulated to the RR and back well

## 2022-04-16 NOTE — Consult Note (Signed)
[facility] [place]  ASSESSMENT / PLAN: 76 y.o. female with symptomatic right carotid artery stenosis; left carotid artery occlusion.  The patient's carotid artery stenosis merits consideration of revascularization. I quoted the patient risk reduction from intervention of ~17% for symptomatic stenosis based on data from the NASCET trial. We reviewed the common risks with carotid revascularization including stroke, cranial nerve injury, and bleeding.  The patient should continue best medical therapy for carotid artery stenosis including: Complete cessation from all tobacco products. Blood glucose control with goal A1c < 7%. Blood pressure control with goal blood pressure < 140/90 mmHg. Lipid reduction therapy with goal LDL-C <100 mg/dL (<70 if symptomatic from carotid artery stenosis).  Aspirin '81mg'$  PO QD.  Clopidogrel '75mg'$  PO QD. Atorvastatin 40-'80mg'$  PO QD (or other "high intensity" statin therapy).  Will discuss with Dr. Kerby Nora. Plan R TCAR later this week.   CHIEF COMPLAINT: aphasia, right sided numbness, visual disturbance, dizziness, headache.   HISTORY OF PRESENT ILLNESS: KHUSHBU PIPPEN is a 76 y.o. female admitted to the medicine service for TIA. The patient reports yesterday she developed aphasia, right sided numbness, visual disturbance, dizziness, and headache. These all spontaneously resolved. She presented to the ER where a stroke workup was initiated. CT angiogram shows known left carotid occlusion and severe right carotid artery stenosis. Dr. Virl Cagey discussed options for this patient on the phone 4 days ago and they had made plans to do a TCAR. This had not yet been scheduled.   VASCULAR SURGICAL HISTORY: L SFA stenting 2011  VASCULAR RISK FACTORS: Positive history of stroke / transient ischemic attack. Positive history of coronary artery disease. + history of PCI.  Positive history of diabetes mellitus. Last A1c 6.2. Positive history of  smoking. Not actively smoking. Positive history of hypertension.  Positive history of chronic kidney disease.  Last GFR 32. CKD 3. Negative history of chronic obstructive pulmonary disease.  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 known 60-70% mid-LAD stenosis with caths in 1999, 2002, 2006, and 2012 showing stable anatomy b. 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[place])    Diabetes mellitus    Diverticulosis    Dizziness    chronic   Dyslipidemia    Fatty liver    GERD (gastroesophageal reflux disease)    hiatial hernia   Heart murmur    Hiatal hernia    Hypertension    Irritable bowel syndrome    Pancreatitis, chronic ([place])    Peripheral vascular disease ([place]) 02/20/2010   s/p stent of left SFA; carotid stenosis   Seizure ([place]) 11/04/2018   last 1 yr 2020 per pt unsure month    Sleep apnea    no cpap    Stroke [facility])    Thyroid nodule    Vitamin B12 deficiency     Past Surgical History:  Procedure Laterality Date   ABDOMINAL HYSTERECTOMY     partial   AUGMENTATION MAMMAPLASTY     bilateral 1976 retro    BREAST ENHANCEMENT SURGERY     CARDIAC CATHETERIZATION  07/07/98;08/31/01;03/04/05   60 -70% LAD   CATARACT EXTRACTION Bilateral    COLON RESECTION N/A 10/14/2017   Procedure: EXPLORATORY LAPAROTOMY,  EXTENDED RIGHT COLECTOMY; APPLICATION OF ABDOMINAL VACUUM DRESSING;  Surgeon: Jovita Kussmaul, MD;  Location: [facility] ORS;  Service: General;  Laterality: N/A;   COLONOSCOPY     ENDOBRONCHIAL ULTRASOUND Bilateral 02/24/2017   Procedure: ENDOBRONCHIAL ULTRASOUND;  Surgeon: Collene Gobble, MD;  Location: [facility] ENDOSCOPY;  Service: Cardiopulmonary;  Laterality: Bilateral;   FEMORAL ARTERY STENT     Left leg   LAPAROTOMY N/A 10/16/2017   Procedure: EXPLORATORY LAPAROTOMY, PARTIAL OMENTECTOMY,  RESECTION ISCHEMIC ILEUM 295MW, APPLICATION OF VAC ABDOMINAL DRESSING;  Surgeon: Armandina Gemma, MD;  Location: [facility] ORS;  Service: General;  Laterality: N/A;   LAPAROTOMY N/A 10/18/2017   Procedure: RE-EXPLORATION OF ABDOMEN, ILEOSTOMY CREATION;  Surgeon: Stark Klein, MD;  Location: [facility] ORS;  Service: General;  Laterality: N/A;   LUNG SURGERY  03/2017   Benign polyps removed   PATELLA REALIGNMENT Left    POLYPECTOMY     SIGMOIDOSCOPY     TONSILLECTOMY      Family History  Problem Relation Age of Onset   Hypertension Mother    Osteoarthritis Mother    Pulmonary embolism Mother    Hypertension Father        aneurysm   Stroke Father    Colon cancer Paternal Grandfather    Stroke Other    Breast cancer Neg Hx    Colon polyps Neg Hx    Esophageal cancer Neg Hx    Rectal cancer Neg Hx    Stomach cancer Neg Hx    Pancreatic cancer Neg Hx    Liver disease Neg Hx     Social History   Socioeconomic History   Marital status: Divorced    Spouse name: Not on file   Number of children: 2   Years of education: Not on file   Highest education level: Not on file  Occupational History   Occupation: retired    Fish farm manager: DISABLED  Tobacco Use   Smoking status: Former    Packs/day: 1.00    Years: 50.00    Total pack years: 50.00    Types: Cigarettes    Quit date: 10/14/2010    Years since quitting: 11.5   Smokeless tobacco: Never  Vaping Use   Vaping Use: Never used  Substance and Sexual Activity   Alcohol use: No   Drug use: No   Sexual activity: Not on file  Other Topics Concern   Not on file  Social History Narrative   Not on file   Social Determinants of Health   Financial Resource Strain: Not on file  Food Insecurity: Not on file  Transportation Needs: Not on file  Physical Activity: Not on file  Stress: Not on file  Social Connections: Not on file  Intimate Partner Violence: Not on file    Allergies  Allergen Reactions   Adhesive [Tape] Other (See Comments)     "Took off my skin"   Other Rash    "Took off my skin"   Ace Inhibitors Other (See Comments)    Unknown   Sulfa Antibiotics Other (See Comments)    Unknown   Topiramate Other (See Comments)   Codeine Other (See Comments)    Altered mental state    Iodinated Contrast Media Rash    Current Facility-Administered Medications  Medication Dose Route Frequency Provider Last Rate Last Admin   0.9 %  sodium chloride infusion   Intravenous Continuous Karmen Bongo, MD   Stopped at 04/15/22 2030   acetaminophen (TYLENOL) tablet 650 mg  650 mg Oral  Q4H PRN Karmen Bongo, MD   650 mg at 04/15/22 2258   Or   acetaminophen (TYLENOL) 160 MG/5ML solution 650 mg  650 mg Per Tube Q4H PRN Karmen Bongo, MD       Or   acetaminophen (TYLENOL) suppository 650 mg  650 mg Rectal Q4H PRN Karmen Bongo, MD       albuterol (PROVENTIL) (2.5 MG/3ML) 0.083% nebulizer solution 2.5 mg  2.5 mg Nebulization Q6H PRN Karmen Bongo, MD       aspirin chewable tablet 81 mg  81 mg Oral Daily Karmen Bongo, MD   81 mg at 04/16/22 0914   atorvastatin (LIPITOR) tablet 40 mg  40 mg Oral Daily Karmen Bongo, MD   40 mg at 04/16/22 0914   clopidogrel (PLAVIX) tablet 75 mg  75 mg Oral Daily Karmen Bongo, MD   75 mg at 04/16/22 0913   enoxaparin (LOVENOX) injection 30 mg  30 mg Subcutaneous Q24H Karmen Bongo, MD   30 mg at 04/15/22 1412   hydrALAZINE (APRESOLINE) injection 5 mg  5 mg Intravenous Q4H PRN Karmen Bongo, MD       HYDROcodone-acetaminophen [facility]) 10-325 MG per tablet 1 tablet  1 tablet Oral QID PRN Karmen Bongo, MD   1 tablet at 04/15/22 2319   insulin aspart (novoLOG) injection 0-15 Units  0-15 Units Subcutaneous TID WC Karmen Bongo, MD   5 Units at 04/16/22 1214   levETIRAcetam (KEPPRA) tablet 500 mg  500 mg Oral BID Karmen Bongo, MD   500 mg at 04/16/22 0913   lipase/protease/amylase (CREON) capsule 36,000 Units  36,000 Units Oral Q breakfast Karmen Bongo, MD   36,000 Units at 04/16/22  0841   mirtazapine (REMERON) tablet 15 mg  15 mg Oral Ivery Quale, MD   15 mg at 04/15/22 2121   montelukast (SINGULAIR) tablet 10 mg  10 mg Oral Daily Karmen Bongo, MD   10 mg at 04/16/22 0914   ondansetron (ZOFRAN) injection 4 mg  4 mg Intravenous Q6H PRN Karmen Bongo, MD   4 mg at 04/15/22 1810   pantoprazole (PROTONIX) EC tablet 40 mg  40 mg Oral BID Karmen Bongo, MD   40 mg at 04/16/22 0914   senna-docusate (Senokot-S) tablet 1 tablet  1 tablet Oral QHS PRN Karmen Bongo, MD       venlafaxine XR (EFFEXOR-XR) 24 hr capsule 75 mg  75 mg Oral Daily Karmen Bongo, MD   75 mg at 04/16/22 2423   Current Outpatient Medications  Medication Sig Dispense Refill   acetaminophen (TYLENOL) 325 MG tablet Take 650 mg by mouth every 6 (six) hours as needed for mild pain.     albuterol (VENTOLIN HFA) 108 (90 Base) MCG/ACT inhaler Inhale 1-2 puffs into the lungs every 6 (six) hours as needed for wheezing or shortness of breath.     amLODipine (NORVASC) 10 MG tablet Take 10 mg by mouth daily.     aspirin 81 MG chewable tablet Chew 1 tablet (81 mg total) by mouth daily.     atorvastatin (LIPITOR) 20 MG tablet Take 20 mg by mouth daily.     cholestyramine (QUESTRAN) 4 g packet Take 0.5 packets by mouth in the morning and at bedtime.     clopidogrel (PLAVIX) 75 MG tablet Take 75 mg by mouth daily.     [employer] DIGESTIVE PROBIOTIC 250 MG capsule Take 500 mg by mouth daily.     fluticasone (FLONASE) 50 MCG/ACT nasal spray Place 2 sprays into both nostrils  daily as needed for allergies.     furosemide (LASIX) 20 MG tablet Take 20 mg by mouth daily.     hydrALAZINE (APRESOLINE) 100 MG tablet Take 1 tablet (100 mg total) by mouth every 8 (eight) hours. 90 tablet 0   HYDROcodone-acetaminophen (NORCO) 10-325 MG tablet Take 1 tablet by mouth 4 (four) times daily as needed for moderate pain.     levETIRAcetam (KEPPRA) 500 MG tablet TAKE 1 TABLET BY MOUTH TWICE A DAY (Patient taking differently: Take 500  mg by mouth 2 (two) times daily.) 180 tablet 2   losartan (COZAAR) 100 MG tablet Take 1 tablet (100 mg total) by mouth daily. (Patient taking differently: Take 100 mg by mouth daily. Total of '150mg'$ ) 45 tablet 8   losartan (COZAAR) 50 MG tablet Take 50 mg by mouth daily. Total of 150 mg     magnesium oxide (MAG-OX) 400 MG tablet Take 1 tablet by mouth daily.     metoprolol succinate (TOPROL-XL) 100 MG 24 hr tablet Take 100 mg by mouth daily.     mirtazapine (REMERON) 15 MG tablet Take 15 mg by mouth at bedtime.     montelukast (SINGULAIR) 10 MG tablet Take 10 mg by mouth daily.     nitroGLYCERIN (NITROSTAT) 0.4 MG SL tablet Place 1 tablet (0.4 mg total) under the tongue every 5 (five) minutes as needed for chest pain. 90 tablet 3   ondansetron (ZOFRAN) 4 MG tablet Take 1 tablet (4 mg total) by mouth every 6 (six) hours as needed for nausea or vomiting. 40 tablet 1   Pancrelipase, Lip-Prot-Amyl, (ZENPEP) 40000-126000 units CPEP Take 2 capsules with meals and one capsule with snacks. (Patient taking differently: Take 1 capsule by mouth daily with breakfast.) 240 capsule 0   pantoprazole (PROTONIX) 40 MG tablet Take 1 tablet (40 mg total) by mouth 2 (two) times daily. 180 tablet 2   Polysaccharide-Iron Complex 150 MG CAPS Take 1 tablet by mouth daily.     triamterene-hydrochlorothiazide (MAXZIDE-25) 37.5-25 MG tablet Take 0.5 tablets by mouth every morning.     venlafaxine XR (EFFEXOR-XR) 75 MG 24 hr capsule Take 1 capsule (75 mg total) by mouth daily.     vitamin B-12 (CYANOCOBALAMIN) 500 MCG tablet Take 500 mcg by mouth daily.     Vitamin D, Ergocalciferol, (DRISDOL) 1.25 MG (50000 UT) CAPS capsule Take 50,000 Units by mouth every Wednesday.       PHYSICAL EXAM Vitals:   04/16/22 1500 04/16/22 1515 04/16/22 1545 04/16/22 1600  BP:    (!) 155/71  Pulse: 92 90 91 94  Resp: (!) '30 18 17 14  '$ Temp:      TempSrc:      SpO2: 97% 93% 95% 93%  Weight:      Height:        Constitutional: Elderly.  Chronically ill. Neurologic: CN intact. no focal findings. Moving all extremities without weakness. Psychiatric:  Mood and affect symmetric and appropriate. Cardiac: regular rate and rhythm.  Respiratory:  unlabored. Peripheral vascular: 2+ radial pulses  PERTINENT LABORATORY AND RADIOLOGIC DATA  Most recent CBC    Latest Ref Rng & Units 04/15/2022    9:46 AM 04/15/2022    9:43 AM 04/09/2022    3:44 AM  CBC  WBC 4.0 - 10.5 K/uL  18.3  9.2   Hemoglobin 12.0 - 15.0 g/dL 12.2  11.4  11.5   Hematocrit 36.0 - 46.0 % 36.0  36.7  35.0   Platelets 150 - 400 K/uL  224  213      Most recent CMP    Latest Ref Rng & Units 04/15/2022    9:46 AM 04/15/2022    9:43 AM 04/09/2022    3:44 AM  CMP  Glucose 70 - 99 mg/dL 114  121  201   BUN 8 - 23 mg/dL '19  17  18   '$ Creatinine 0.44 - 1.00 mg/dL 1.80  1.68  1.75   Sodium 135 - 145 mmol/L 135  135  141   Potassium 3.5 - 5.1 mmol/L 3.9  3.8  3.8   Chloride 98 - 111 mmol/L 104  107  111   CO2 22 - 32 mmol/L  19  18   Calcium 8.9 - 10.3 mg/dL  8.6  8.5   Total Protein 6.5 - 8.1 g/dL  6.3    Total Bilirubin 0.3 - 1.2 mg/dL  0.8    Alkaline Phos 38 - 126 U/L  100    AST 15 - 41 U/L  29    ALT 0 - 44 U/L  21      Renal function Estimated Creatinine Clearance: 28 mL/min (A) (by C-G formula based on SCr of 1.8 mg/dL (H)).  Hgb A1c MFr Bld (%)  Date Value  04/16/2022 6.2 (H)    LDL Cholesterol  Date Value Ref Range Status  04/16/2022 28 0 - 99 mg/dL Final    Comment:           Total Cholesterol/HDL:CHD Risk Coronary Heart Disease Risk Table                     Men   Women  1/2 Average Risk   3.4   3.3  Average Risk       5.0   4.4  2 X Average Risk   9.6   7.1  3 X Average Risk  23.4   11.0        Use the calculated Patient Ratio above and the CHD Risk Table to determine the patient's CHD Risk.        ATP III CLASSIFICATION (LDL):  <100     mg/dL   Optimal  100-129  mg/dL   Near or Above                    Optimal  130-159  mg/dL    Borderline  160-189  mg/dL   High  >190     mg/dL   Very High Performed at [facility] [address] [place], [place] [address]      Vascular Imaging: CT angiogram personally reviewed Severe right carotid artery stenosis and left carotid occlusion redemonstrated.  I do not agree with the "string" sign - there is normal caliber internal carotid artery past the stenosis extending into the skull and the terminal ICA.  Yevonne Aline. Stanford Breed, MD [facility] Phone Number: [phone] 04/16/2022 4:59 PM  Total time spent on preparing this encounter including chart review, data review, collecting history, examining the patient, coordinating care for this established patient, 30 minutes.   Portions of this report may have been transcribed using voice recognition software.  Every effort has been made to ensure accuracy; however, inadvertent computerized transcription errors may still be present.

## 2022-04-16 NOTE — Evaluation (Signed)
Physical Therapy Evaluation & Discharge Patient Details Name: [name] MRN: [id number] DOB: 11/07/1945 Today's Date: 04/16/2022  History of Present Illness  Pt is a 76 y.o. female admitted 04/15/22 with RUE numbness, visual blurriness, aphasia, frontal headache. MRI revealed R acute occipital infarct. Of note, undergoing workup for transcarotid artery revascularization. PMH includes CVA, OSA on CPAP, CAD, DM, HTN, PVD, chronic pancreatitis, ischemic bowel s/p colectomy with subsequent reversal.   Clinical Impression  Patient evaluated by Physical Therapy with no further acute PT needs identified. PTA, pt independent, drives, lives alone. Today, pt moving fairly well without DME; noted mild unsteadiness, no overt LOB. Pt reports significant improvement in symptoms with no complaints today. All education has been completed and the patient has no further questions. Acute PT is signing off. Thank you for this referral.   Recommendations for follow up therapy are one component of a multi-disciplinary discharge planning process, led by the attending physician.  Recommendations may be updated based on patient status, additional functional criteria and insurance authorization.  Follow Up Recommendations No PT follow up      Assistance Recommended at Discharge Intermittent Supervision/Assistance  Patient can return home with the following  Assistance with cooking/housework    Equipment Recommendations None recommended by PT  Recommendations for Other Services   N/A          Precautions / Restrictions Precautions Precautions: Fall;Other (comment) Precaution Comments: urine incontinence (wears Depends baseline) Restrictions Weight Bearing Restrictions: No      Mobility  Bed Mobility Overal bed mobility: Independent             General bed mobility comments: mod indep on/off stretcher    Transfers Overall transfer level: Independent Equipment used: None Transfers: Sit  to/from Stand                  Ambulation/Gait Ambulation/Gait assistance: Modified independent (Device/Increase time) Gait Distance (Feet): 240 Feet Assistive device: None Gait Pattern/deviations: Step-through pattern, Decreased stride length Gait velocity: Decreased     General Gait Details: slow, mostly steady gait without DME; mod indep for increased time, no overt LOB  Stairs            Wheelchair Mobility    Modified Rankin (Stroke Patients Only) Modified Rankin (Stroke Patients Only) Pre-Morbid Rankin Score: No symptoms Modified Rankin: No significant disability     Balance Overall balance assessment: Needs assistance Sitting-balance support: Feet supported, Feet unsupported Sitting balance-Leahy Scale: Good     Standing balance support: No upper extremity supported, During functional activity Standing balance-Leahy Scale: Good                   Standardized Balance Assessment Standardized Balance Assessment : Dynamic Gait Index   Dynamic Gait Index Level Surface: Mild Impairment Change in Gait Speed: Mild Impairment Gait with Horizontal Head Turns: Normal Gait with Vertical Head Turns: Normal Gait and Pivot Turn: Mild Impairment Step Over Obstacle: Mild Impairment Step Around Obstacles: Mild Impairment Steps: Mild Impairment Total Score: 18       Pertinent Vitals/Pain Pain Assessment Pain Assessment: No/denies pain    Home Living Family/patient expects to be discharged to:: Private residence Living Arrangements: Alone Available Help at Discharge: Family;Friend(s);Available PRN/intermittently Type of Home: House Home Access: Stairs to enter Entrance Stairs-Rails: Right;Left Entrance Stairs-Number of Steps: 2   Home Layout: One level Home Equipment: Advice worker (2 wheels);Cane - single point Additional Comments: Son and daughter-in-law live nearby    Prior Function Prior  Level of Function : Independent/Modified  Independent;Driving             Mobility Comments: Indep without DME; reports most recent fall >6 mo prior onto L knee and L wrist. Drives, grocery shops, enjoys meeting up with friends ADLs Comments: Independent     Hand Dominance   Dominant Hand: Right    Extremity/Trunk Assessment   Upper Extremity Assessment Upper Extremity Assessment: Overall WFL for tasks assessed (h/o L wrist fx)    Lower Extremity Assessment Lower Extremity Assessment: Generalized weakness (reports h/o fall on L knee, "I'll probably need a knee replacement"; functional strength >3/5)       Communication   Communication: Other (comment) (apparent slurred speech)  Cognition Arousal/Alertness: Awake/alert Behavior During Therapy: WFL for tasks assessed/performed Overall Cognitive Status: Within Functional Limits for tasks assessed                                          General Comments      Exercises     Assessment/Plan    PT Assessment Patient does not need any further PT services  PT Problem List         PT Treatment Interventions      PT Goals (Current goals can be found in the Care Plan section)  Acute Rehab PT Goals PT Goal Formulation: All assessment and education complete, DC therapy    Frequency       Co-evaluation               AM-PAC PT "6 Clicks" Mobility  Outcome Measure Help needed turning from your back to your side while in a flat bed without using bedrails?: None Help needed moving from lying on your back to sitting on the side of a flat bed without using bedrails?: None Help needed moving to and from a bed to a chair (including a wheelchair)?: None Help needed standing up from a chair using your arms (e.g., wheelchair or bedside chair)?: None Help needed to walk in hospital room?: None Help needed climbing 3-5 steps with a railing? : A Little 6 Click Score: 23    End of Session   Activity Tolerance: Patient tolerated treatment  well Patient left: in bed;with call bell/phone within reach (ED stretcher) Nurse Communication: Mobility status PT Visit Diagnosis: Other abnormalities of gait and mobility (R26.89)    Time: 3151-7616 PT Time Calculation (min) (ACUTE ONLY): 11 min   Charges:   PT Evaluation $PT Eval Low Complexity: [place], PT, DPT Acute Rehabilitation Services  Personal: [facility] Office: [phone]  Derry Lory 04/16/2022, 9:20 AM

## 2022-04-16 NOTE — Procedures (Signed)
Patient Name: BIRTHA HATLER  MRN: [id number]  Epilepsy Attending: Lora Havens  Referring Physician/Provider: Katy Apo, NP  Date: 04/16/2022 Duration: 22.06 mins  Patient history: 76yo F with ams. EEG to evaluate for seizure.  Level of alertness: Awake, asleep  AEDs during EEG study: LEV  Technical aspects: This EEG study was done with scalp electrodes positioned according to the 10-20 International system of electrode placement. Electrical activity was acquired at a sampling rate of '500Hz'$  and reviewed with a high frequency filter of '70Hz'$  and a low frequency filter of '1Hz'$ . EEG data were recorded continuously and digitally stored.   Description: The posterior dominant rhythm consists of 8-9 Hz activity of moderate voltage (25-35 uV) seen predominantly in posterior head regions, symmetric and reactive to eye opening and eye closing.  Sleep was characterized by vertex waves, sleep spindles (12 to 14 Hz), maximal frontocentral region. EEG showed intermittent 3 to 6 Hz theta-delta slowing in left temporo-parietal region. Hyperventilation and photic stimulation were not performed.     ABNORMALITY - Intermittent slow, left temporo-parietal region  IMPRESSION: This study is suggestive of cortical dysfunction arising from left temporo-parietal region, nonspecific etiology but could be secondary to underlying structural abnormality, post-ictal state. No seizures or epileptiform discharges were seen throughout the recording.  Ronte Parker Barbra Sarks

## 2022-04-16 NOTE — ED Notes (Signed)
HTN noted; does not meet parameters for PRN antihypertensive administration. Will continue to monitor. Pt asymptomatic at this time.

## 2022-04-16 NOTE — Progress Notes (Signed)
EEG complete - results pending 

## 2022-04-16 NOTE — Progress Notes (Addendum)
STROKE TEAM PROGRESS NOTE   INTERVAL HISTORY Patient is seen in her room with no family at the bedside.  She reports that yesterday, she experienced sudden onset aphasia, right arm and leg numbness, dizziness, wavy lines in her vision and a headache.  Symptoms have fully resolved.  She has a history of migraines and has had vision changes with headaches before.  MRI show punctate right occipital stroke which does not explain her symptoms.  CT angiogram shows chronic left ICA occlusion with near occlusive right ICA stenosis with radiographic string sign.  This appears progressed compared to previous CT angio from 03/15/2022 when it had shown 70 to 80% stenosis.  Patient had been referred to vascular surgery following a previous admission for stroke in March 2023 by Dr. Erlinda Hong for right carotid revascularization but so far it has not yet been done  Vitals:   04/16/22 0400 04/16/22 0500 04/16/22 0600 04/16/22 1025  BP: (!) 196/82 (!) 196/136 (!) 186/130 (!) 198/84  Pulse: 81 82 88 90  Resp: '14 10 12 '$ (!) 22  Temp:      TempSrc:      SpO2: 97% 98% (!) 89% 93%  Weight:      Height:       CBC:  Recent Labs  Lab 04/15/22 0943 04/15/22 0946  WBC 18.3*  --   NEUTROABS 13.9*  --   HGB 11.4* 12.2  HCT 36.7 36.0  MCV 95.8  --   PLT 224  --    Basic Metabolic Panel:  Recent Labs  Lab 04/15/22 0943 04/15/22 0946  NA 135 135  K 3.8 3.9  CL 107 104  CO2 19*  --   GLUCOSE 121* 114*  BUN 17 19  CREATININE 1.68* 1.80*  CALCIUM 8.6*  --    Lipid Panel:  Recent Labs  Lab 04/16/22 0331  CHOL 108  TRIG 90  HDL 62  CHOLHDL 1.7  VLDL 18  LDLCALC 28   HgbA1c:  Recent Labs  Lab 04/16/22 0818  HGBA1C 6.2*   Urine Drug Screen:  Recent Labs  Lab 04/15/22 1218  LABOPIA POSITIVE*  COCAINSCRNUR NONE DETECTED  LABBENZ NONE DETECTED  AMPHETMU NONE DETECTED  THCU NONE DETECTED  LABBARB NONE DETECTED    Alcohol Level  Recent Labs  Lab 04/15/22 0943  ETH <10    IMAGING past 24  hours CT ANGIO HEAD NECK W WO CM  Result Date: 04/16/2022 CLINICAL DATA:  76 year old female code stroke presentation yesterday. Punctate right occipital white matter infarct on MRI. EXAM: CT ANGIOGRAPHY HEAD AND NECK TECHNIQUE: Multidetector CT imaging of the head and neck was performed using the standard protocol during bolus administration of intravenous contrast. Multiplanar CT image reconstructions and MIPs were obtained to evaluate the vascular anatomy. Carotid stenosis measurements (when applicable) are obtained utilizing NASCET criteria, using the distal internal carotid diameter as the denominator. RADIATION DOSE REDUCTION: This exam was performed according to the departmental dose-optimization program which includes automated exposure control, adjustment of the mA and/or kV according to patient size and/or use of iterative reconstruction technique. CONTRAST:  72m OMNIPAQUE IOHEXOL 350 MG/ML SOLN COMPARISON:  Head CT and brain MRI yesterday. CTA head and neck 03/14/2022.  Thyroid ultrasound 05/18/2020. FINDINGS: CT HEAD Brain: Stable non contrast CT appearance of the brain. Small right occipital lobe white matter infarct by MRI is occult by CT. Calvarium and skull base: No acute osseous abnormality identified. Paranasal sinuses: Visualized paranasal sinuses and mastoids are stable and well aerated. Orbits:  No acute orbit or scalp soft tissue finding. CTA NECK Skeleton: Right TMJ degeneration. Lower cervical spine disc and endplate degeneration. No acute osseous abnormality identified. Upper chest: Centrilobular emphysema. Patchy right lung ground-glass opacity has regressed since last month. No superior mediastinal lymphadenopathy. Other neck: Stable neck soft tissues. Thyroid nodules on the right side This has been evaluated on previous imaging. (ref: J Am Coll Radiol. 2015 Feb;12(2): 143-50). Aortic arch: Severe Calcified aortic atherosclerosis. 3 vessel arch configuration. Right carotid system:  Brachiocephalic artery and right carotid atherosclerosis appear not significantly changed, including high-grade stenosis of the right ICA bulb approaching a radiographic string sign on series 9, image 99 and series 11, image 199. Right ICA remains patent to the skull base. Left carotid system: Stable atherosclerotic left CCA with high-grade origin stenosis and chronically occluded cervical left ICA with no reconstitution to the skull base. Vertebral arteries: Proximal right subclavian artery plaque with up to 50% stenosis. Moderate stenosis right vertebral artery origin is stable on series 12, image 128. Right vertebral artery is otherwise negative to the skull base. Stable proximal left subclavian artery atherosclerosis. Normal left vertebral artery origin. Left vertebral is patent to the skull base without stenosis. CTA HEAD Posterior circulation: Codominant distal vertebral arteries with V4 calcified plaque but only mild distal vertebral stenosis. Normal vertebrobasilar junction and basilar artery. Normal SCA and PCA origins. Left PCA branches are within normal limits. Mild right PCA irregularity is stable. Left posterior communicating artery is normal. Anterior circulation: Right ICA siphon is patent with calcified plaque and mild to moderate right siphon stenosis. Left ICA siphon is reconstituted as before from the left PCOM. Patent carotid termini, MCA and ACA origins. Dominant right and diminutive left ACA A1 segments again noted. Anterior communicating artery and bilateral ACA branches are within normal limits. Right MCA M1 segment and bifurcation remain patent. Right MCA branches are stable with mild irregularity. Left MCA M1 segment bifurcates early without stenosis. Left MCA branches are stable with mild irregularity in the posterior most M2 division. Venous sinuses: Early contrast timing, not evaluated. Anatomic variants: Dominant right and diminutive left ACA A1 segments. Review of the MIP images  confirms the above findings IMPRESSION: 1. Stable CTA Head And Neck since last month, negative for large vessel occlusion. But Positive for: - chronically occluded Left ICA in the neck. Left ICA terminus is reconstituted by the PComm. - Severe underlying cervical carotid atherosclerosis. High-grade Right ICA bulb stenosis approaching a RADIOGRAPHIC STRING SIGN. - up to moderate stenosis of the Right Vertebral Artery origin, Right ICA siphon. - Aortic Atherosclerosis (ICD10-I70.0 2. Stable non contrast CT appearance of the brain. Small right occipital lobe white matter infarct by MRI remains occult by CT. 3. Emphysema (ICD10-J43.9). Electronically Signed   By: Genevie Ann M.D.   On: 04/16/2022 11:14    PHYSICAL EXAM General:  Alert, well-nourished, well-developed patient in no acute distress Respiratory:  Regular, unlabored respirations on room air  NEURO:  Mental Status: AA&Ox3  Speech/Language: speech is without dysarthria or aphasia.  Repetition, fluency, and comprehension intact.  Cranial Nerves:  II: PERRL. Visual fields full.  III, IV, VI: EOMI. Eyelids elevate symmetrically.  V: Sensation is intact to light touch and symmetrical to face.  VII: Smile is symmetrical.   VIII: hearing intact to voice. IX, X: Phonation is normal.  LT:[id number] shrug 5/5. XII: tongue is midline without fasciculations. Motor: 5/5 strength to all muscle groups tested.  Tone: is normal and bulk is normal Sensation- Intact to  light touch bilaterally.  Coordination: FTN intact bilaterally.No drift.  Gait- deferred   ASSESSMENT/PLAN [name] is a 76 y.o. female with history of chronic L ICA occlusion, R ICA stenosis (seeing vascular surgery as an outpatient for intervention), seizures, stroke, DM, asthma, COPD, PAD, CKD stage 4 and HTN presenting with sudden onset aphasia, right arm and leg numbness, dizziness, wavy lines in her vision and a headache.  Symptoms have fully resolved.  She has a history of  migraines and has had vision changes with headaches before.  MRI show punctate right occipital stroke which does not explain her symptoms.  Silent punctate right occipital infarct along with left sided TIA vs. Complicated migraine..  Patient with chronic left carotid occlusion with no worsening right carotid preocclusive stenosis Code Stroke CT head No acute abnormality. ASPECTS 10.    CTA head & neck chronically occluded L ICA, severe right ICA stenosis approaching radiographic string sign, right vertebral artery stenosis  MRI  punctate right occipital infarct TCD pending 2D Echo EF 45-50%, mild LVH, apical hypokinesis, interatrial septum not well visualized LDL 28 HgbA1c 6.2 VTE prophylaxis - lovenox    Diet   Diet regular Room service appropriate? Yes; Fluid consistency: Thin   aspirin 81 mg daily prior to admission, now on aspirin 81 mg daily and clopidogrel 75 mg daily for three weeks, then clopidogrel alone Therapy recommendations:  pending Disposition:  pending, likely home  Hypertension Home meds:  amlodipine 10 mg daily, metoprolol XL 100 mg daily, losartan 50 mg daily Stable Permissive hypertension (OK if < 220/120) but gradually normalize in 5-7 days Long-term BP goal normotensive  Hyperlipidemia Home meds:  atorvastatin 20 mg daily, resumed in hospital LDL 28, goal < 70 High intensity statin not indicated as LDL below goal Continue statin at discharge  Diabetes type II Controlled Home meds:  none HgbA1c 6.2, goal < 7.0 CBGs Recent Labs    04/15/22 2022 04/16/22 0827 04/16/22 1203  GLUCAP 132* 196* 202*    SSI  Seizure disorder EEG to rule out seizure activity Continue home Keppra  Other Stroke Risk Factors Advanced Age >/= 37  Former cigarette smoker Obesity, Body mass index is 32.34 kg/m., BMI >/= 30 associated with increased stroke risk, recommend weight loss, diet and exercise as appropriate  Hx stroke  Other Active Problems none  Hospital day  # Vance , MSN, AGACNP-BC [facility] See Amion for schedule and pager information 04/16/2022 1:25 PM  I have personally obtained history,examined this patient, reviewed notes, independently viewed imaging studies, participated in medical decision making and plan of care.ROS completed by me personally and pertinent positives fully documented  I have made any additions or clarifications directly to the above note. Agree with note above.  Patient presented with a right-sided paresthesias along with headache and blurred vision possibly complicated migraine episode since she had similar episode before was elected [facility] TIA due to chronic left ICA occlusion with collaterals.  She also has a silent right occipital infarct but CT angiogram  of neck shows progressive extracranial right carotid stenosis with string sign which will need emergent revascularization.  Avoid hypotension and keep systolic blood pressure 086-761 range.  Recommend vascular surgery consult for emergent revascularization.  Dual antiplatelet therapy aspirin and Plavix and aggressive risk factor modification.  Long discussion with patient and answered questions.  Discussed with Dr. Avon Gully.  Greater than 50% time during this 50-minute visit was spent on counseling and coordination  of care about her left hemispheric TIA as well as progressive carotid stenosis and discussion about revascularization plans and answering questions.  Antony Contras, MD Medical Director [facility] Pager: [phone] 04/16/2022 3:29 PM   To contact Stroke Continuity provider, please refer to [email or web address] After hours, contact General Neurology

## 2022-04-16 NOTE — Progress Notes (Signed)
PROGRESS NOTE    [name]  KWI:[id number] DOB: 1946-06-30 DOA: 04/15/2022 PCP: Donnajean Lopes, MD   Brief Narrative:  [name] is a 76 y.o. female with medical history significant of CAD; DM; HLD; HTN; PVD; chronic pancreatitis; ischemic bowel with colectomy/colostomy with subsequent reversal; seizure; OSA not on CPAP; with no known carotid stenosis, planned outpatient follow-up with vascular surgery given known carotid stenosis and history of CVA.  Patient presents with strokelike symptoms having visited her PCP with right arm numbness slurred speech and given her known history and risk factors she was sent to the ED for further evaluation and treatment.  Neurology consulted at intake, imaging initially unremarkable however CTA and transcranial Doppler shows a string sign concerning for imminent vascular compromise as such patient will be kept for vascular follow-up in-house given her high risk for recurrent CVA.  Assessment & Plan:   Principal Problem:   Acute CVA (cerebrovascular accident) ([place]) Active Problems:   Accelerated hypertension   Asthma   Diabetes due to underlying condition w oth circulatory comp ([place])   PVD (peripheral vascular disease) ([place])   CKD (chronic kidney disease) stage 3, GFR 30-59 ml/min   HLD (hyperlipidemia)   Obstructive apnea   Seizures ([place])   H/O ischemic bowel disease   Acute metabolic encephalopathy   Acute punctate CVA Critical right ICA bulb stenosis -Patient with transient numbness of R forearm, R cheek, and aphasia - all of these symptoms have resolved -MRI shows punctate occipital CVA that would not explain her current symptoms -CTA head/neck without overt large vessel occlusion, chronically occluded left ICA with severe stenosis of the right ICA bulb approaching "string sign" -Vascular surgery consulted -Echo done recently so will not repeat at this time -Patient has been on ASA and Plavix. Will defer DAPT decisions to  neurology for now. -Neurology/PT/OT/ST/Nutrition Consults   Encephalopathy, transient -Neurology following, EEG negative -Questionably secondary to hypotension versus low blood flow given critical carotid stenosis as above -Resolved - continue to follow clinically   PVD/CAD Critical R ICA bulb stenosis -remote h/o L SFA stenting -Followed with Dr. Virl Cagey on 6/30 -Vascular consulted as above given critical R IC bulb stenosis/String sign   HTN -Allow permissive HTN for now per neuro -Hold amlodipine, hydralazine, losartan, metoprolol and Maxzide and plan to restart in 48-72 hours -Poorly controlled HTN at baseline, recently admitted for HTN crisis -Continue hydralazine for BP > 220/120   HLD -Lipid panel WNL -Continue Lipitor increased to 40 mg daily   DM, well controlled, diet controlled. -A1C 6.2 -Not on home meds -Will order moderate-scale SSI   Stage 3b CKD -At baseline -Will follow, attempt to avoid nephrotoxic agents    Chronic pancreatitis -Hold cholestyramine, as patient failed swallow evaluation -Resume Creon with meals, when able   Seizure d/o -Continue Keppra -Order EEG   OSA -Not on CPAP   Chronic pain -I have reviewed this patient in the [place] Controlled Substances Reporting System.  She is receiving medications from only one provider and appears to be taking them as prescribed. -She is at increased risk of opioid misuse, diversion, or overdose.  -Continue Norco   Mood d/o -Continue Remeron, Effexor   Asthma -Continue Singulair, albuterol -Well controlled  DVT prophylaxis: Lovenox Code Status: Full Family Communication: None available  Status is: Inpatient  Dispo: The patient is from: Home              Anticipated d/c is to: To be determined  Anticipated d/c date is: 48 to 72 hours              Patient currently not medically stable for discharge  Consultants:  Neurology; Vascular Surgery  Procedures:  Pending  above  Antimicrobials:  None  Subjective: None no acute issues or events overnight, symptoms resolved denies headache fever chills nausea vomiting diarrhea constipation shortness of breath or chest pain.  Objective: Vitals:   04/16/22 0300 04/16/22 0400 04/16/22 0500 04/16/22 0600  BP: (!) 189/80 (!) 196/82 (!) 196/136 (!) 186/130  Pulse: 81 81 82 88  Resp: '13 14 10 12  '$ Temp:      TempSrc:      SpO2: 92% 97% 98% (!) 89%  Weight:      Height:        Intake/Output Summary (Last 24 hours) at 04/16/2022 0802 Last data filed at 04/15/2022 2030 Gross per 24 hour  Intake 49.03 ml  Output --  Net 49.03 ml   Filed Weights   04/15/22 0900 04/15/22 1004  Weight: 82.8 kg 82.8 kg    Examination:  General:  Pleasantly resting in bed, No acute distress. HEENT:  Normocephalic atraumatic.  Sclerae nonicteric, noninjected.  Extraocular movements intact bilaterally. Neck:  Without mass or deformity.  Trachea is midline. Lungs:  Clear to auscultate bilaterally without rhonchi, wheeze, or rales. Heart:  Regular rate and rhythm.  Without murmurs, rubs, or gallops. Abdomen:  Soft, nontender, nondistended.  Without guarding or rebound. Extremities: Without cyanosis, clubbing, edema Skin:  Warm and dry, no erythema   Data Reviewed: I have personally reviewed following labs and imaging studies  CBC: Recent Labs  Lab 04/15/22 0943 04/15/22 0946  WBC 18.3*  --   NEUTROABS 13.9*  --   HGB 11.4* 12.2  HCT 36.7 36.0  MCV 95.8  --   PLT 224  --    Basic Metabolic Panel: Recent Labs  Lab 04/15/22 0943 04/15/22 0946  NA 135 135  K 3.8 3.9  CL 107 104  CO2 19*  --   GLUCOSE 121* 114*  BUN 17 19  CREATININE 1.68* 1.80*  CALCIUM 8.6*  --    GFR: Estimated Creatinine Clearance: 28 mL/min (A) (by C-G formula based on SCr of 1.8 mg/dL (H)). Liver Function Tests: Recent Labs  Lab 04/15/22 0943  AST 29  ALT 21  ALKPHOS 100  BILITOT 0.8  PROT 6.3*  ALBUMIN 3.2*   No results for  input(s): "LIPASE", "AMYLASE" in the last 168 hours. No results for input(s): "AMMONIA" in the last 168 hours. Coagulation Profile: Recent Labs  Lab 04/15/22 0943  INR 1.2   Cardiac Enzymes: No results for input(s): "CKTOTAL", "CKMB", "CKMBINDEX", "TROPONINI" in the last 168 hours. BNP (last 3 results) No results for input(s): "PROBNP" in the last 8760 hours. HbA1C: No results for input(s): "HGBA1C" in the last 72 hours. CBG: Recent Labs  Lab 04/15/22 0940 04/15/22 2022  GLUCAP 116* 132*   Lipid Profile: Recent Labs    04/16/22 0331  CHOL 108  HDL 62  LDLCALC 28  TRIG 90  CHOLHDL 1.7   Thyroid Function Tests: No results for input(s): "TSH", "T4TOTAL", "FREET4", "T3FREE", "THYROIDAB" in the last 72 hours. Anemia Panel: No results for input(s): "VITAMINB12", "FOLATE", "FERRITIN", "TIBC", "IRON", "RETICCTPCT" in the last 72 hours. Sepsis Labs: No results for input(s): "PROCALCITON", "LATICACIDVEN" in the last 168 hours.  Recent Results (from the past 240 hour(s))  Urine Culture     Status: Abnormal   Collection  Time: 04/08/22 10:59 PM   Specimen: Urine, Clean Catch  Result Value Ref Range Status   Specimen Description URINE, CLEAN CATCH  Final   Special Requests   Final    NONE Performed at [facility], [address], [place], [place] [address]    Culture >=100,000 COLONIES/mL ESCHERICHIA COLI (A)  Final   Report Status 04/10/2022 FINAL  Final   Organism ID, Bacteria ESCHERICHIA COLI (A)  Final      Susceptibility   Escherichia coli - MIC*    AMPICILLIN <=2 SENSITIVE Sensitive     CEFAZOLIN <=4 SENSITIVE Sensitive     CEFEPIME <=0.12 SENSITIVE Sensitive     CEFTRIAXONE <=0.25 SENSITIVE Sensitive     CIPROFLOXACIN <=0.25 SENSITIVE Sensitive     GENTAMICIN <=1 SENSITIVE Sensitive     IMIPENEM <=0.25 SENSITIVE Sensitive     NITROFURANTOIN <=16 SENSITIVE Sensitive     TRIMETH/SULFA <=20 SENSITIVE Sensitive     AMPICILLIN/SULBACTAM <=2 SENSITIVE Sensitive      PIP/TAZO <=4 SENSITIVE Sensitive     * >=100,000 COLONIES/mL ESCHERICHIA COLI         Radiology Studies: MR BRAIN WO CONTRAST  Result Date: 04/15/2022 CLINICAL DATA:  Neuro deficit, acute, stroke suspected EXAM: MRI HEAD WITHOUT CONTRAST TECHNIQUE: Multiplanar, multiecho pulse sequences of the brain and surrounding structures were obtained without intravenous contrast. COMPARISON:  04/08/2022 FINDINGS: Brain: Punctate focus of reduced diffusion is present in the right occipital lobe near the occipital horn. No evidence hemorrhage. Ventricles and sulci are stable in size and configuration. Patchy and confluent areas of T2 hyperintensity in the supratentorial and pontine white matter are nonspecific but probably reflects stable chronic microvascular ischemic changes. There is no intracranial mass or mass effect. No hydrocephalus or extra-axial collection. Vascular: Loss of the left ICA flow void to the clinoid is unchanged. Skull and upper cervical spine: Normal marrow signal is preserved. Sinuses/Orbits: No new finding. Other: Sella is partially empty.  Mastoid air cells are clear. IMPRESSION: Punctate right occipital acute infarct. Otherwise no significant change since recent prior study. Electronically Signed   By: Macy Mis M.D.   On: 04/15/2022 10:23   CT HEAD CODE STROKE WO CONTRAST  Result Date: 04/15/2022 CLINICAL DATA:  Code stroke.  Neuro deficit, acute, stroke suspected EXAM: CT HEAD WITHOUT CONTRAST TECHNIQUE: Contiguous axial images were obtained from the base of the skull through the vertex without intravenous contrast. RADIATION DOSE REDUCTION: This exam was performed according to the departmental dose-optimization program which includes automated exposure control, adjustment of the mA and/or kV according to patient size and/or use of iterative reconstruction technique. COMPARISON:  03/14/2022 FINDINGS: Brain: No acute intracranial hemorrhage, mass effect, or edema. No new loss of  gray-white differentiation. Ventricles and sulci are stable in size and configuration. Patchy hypoattenuation in the supratentorial white matter probably reflects stable chronic microvascular ischemic changes. No extra-axial collection. Vascular: No hyperdense vessel. Skull: Unremarkable. Sinuses/Orbits: No acute finding. Other: Mastoid air cells are clear. ASPECTS ([place] Stroke Program Early CT Score) - Ganglionic level infarction (caudate, lentiform nuclei, internal capsule, insula, M1-M3 cortex): 7 - Supraganglionic infarction (M4-M6 cortex): 3 Total score (0-10 with 10 being normal): 10 IMPRESSION: There is no acute intracranial hemorrhage or evidence of acute infarction. ASPECT score is 10. These results were communicated to Dr. Lorrin Goodell at 9:48 am on 04/15/2022 by text page via the [facility] messaging system. Electronically Signed   By: Macy Mis M.D.   On: 04/15/2022 09:57    Scheduled  Meds:  aspirin  81 mg Oral Daily   atorvastatin  40 mg Oral Daily   clopidogrel  75 mg Oral Daily   diphenhydrAMINE  50 mg Oral Once   Or   diphenhydrAMINE  50 mg Intravenous Once   enoxaparin (LOVENOX) injection  30 mg Subcutaneous Q24H   insulin aspart  0-15 Units Subcutaneous TID WC   levETIRAcetam  500 mg Oral BID   lipase/protease/amylase  36,000 Units Oral Q breakfast   mirtazapine  15 mg Oral QHS   montelukast  10 mg Oral Daily   pantoprazole  40 mg Oral BID   predniSONE  50 mg Oral Q6H   venlafaxine XR  75 mg Oral Daily   Continuous Infusions:  sodium chloride Stopped (04/15/22 2030)     LOS: 0 days   Time spent: 108mn  Medrith Veillon C Ziare Cryder, DO [facility]  If 7PM-7AM, please contact night-coverage [email or web address]  04/16/2022, 8:02 AM

## 2022-04-16 NOTE — ED Notes (Signed)
Pt ambulated to the RR well

## 2022-04-17 ENCOUNTER — Ambulatory Visit: Payer: Medicare HMO | Admitting: General Practice

## 2022-04-17 DIAGNOSIS — R4701 Aphasia: Secondary | ICD-10-CM | POA: Diagnosis not present

## 2022-04-17 DIAGNOSIS — I639 Cerebral infarction, unspecified: Secondary | ICD-10-CM | POA: Diagnosis not present

## 2022-04-17 LAB — GLUCOSE, CAPILLARY
Glucose-Capillary: 102 mg/dL — ABNORMAL HIGH (ref 70–99)
Glucose-Capillary: 126 mg/dL — ABNORMAL HIGH (ref 70–99)
Glucose-Capillary: 105 mg/dL — ABNORMAL HIGH (ref 70–99)

## 2022-04-17 MED ORDER — HYDRALAZINE HCL 20 MG/ML IJ SOLN
5.0000 mg | INTRAMUSCULAR | Status: DC | PRN
  Administered 2022-04-17 – 2022-04-18 (×2): 5 mg via INTRAVENOUS
  Filled 2022-04-17 (×2): qty 1

## 2022-04-17 MED ORDER — AMLODIPINE BESYLATE 10 MG PO TABS
10.0000 mg | ORAL_TABLET | Freq: Every day | ORAL | Status: DC
  Administered 2022-04-17 – 2022-04-19 (×2): 10 mg via ORAL
  Filled 2022-04-17 (×2): qty 1

## 2022-04-17 NOTE — Progress Notes (Signed)
PROGRESS NOTE    SHAWNI VOLKOV  EVO:[id number] DOB: [birth year] DOA: 04/15/2022 PCP: Donnajean Lopes, MD   Brief Narrative:  DONTASIA MIRANDA is a 76 y.o. female with medical history significant of CAD; DM; HLD; HTN; PVD; chronic pancreatitis; ischemic bowel with colectomy/colostomy with subsequent reversal; seizure; OSA not on CPAP; with no known carotid stenosis, planned outpatient follow-up with vascular surgery given known carotid stenosis and history of CVA.  Patient presents with strokelike symptoms having visited her PCP with right arm numbness slurred speech and given her known history and risk factors she was sent to the ED for further evaluation and treatment.  Neurology consulted at intake, imaging initially unremarkable however CTA and transcranial Doppler shows a string sign concerning for imminent vascular compromise as such patient will be kept for vascular follow-up in-house given her high risk for recurrent CVA.  Assessment & Plan:   Principal Problem:   Acute CVA (cerebrovascular accident) ([place]) Active Problems:   Accelerated hypertension   Asthma   Diabetes due to underlying condition w oth circulatory comp ([place])   PVD (peripheral vascular disease) ([place])   CKD (chronic kidney disease) stage 3, GFR 30-59 ml/min   HLD (hyperlipidemia)   Obstructive apnea   Seizures ([place])   H/O ischemic bowel disease   Acute metabolic encephalopathy  Acute punctate CVA Critical right ICA bulb stenosis -Patient with transient numbness of R forearm, R cheek, and aphasia - all of these symptoms have resolved -MRI shows punctate occipital CVA that would not explain her current symptoms -CTA head/neck without overt large vessel occlusion, chronically occluded left ICA with severe stenosis of the right ICA bulb approaching "string sign" -Vascular surgery consulted -Echo done recently so will not repeat at this time -Patient has been on ASA and Plavix. Will defer DAPT decisions to  neurology for now. -Neurology/PT/OT/ST/Nutrition Consults  PVD/CAD Critical R ICA bulb stenosis -remote h/o L SFA stenting -Followed with Dr. Virl Cagey on 6/30 -Vascular consulted as above given critical R IC bulb stenosis/String sign -plan for possible stent 04/18/2022  Encephalopathy, transient, resolved -Neurology following, EEG negative -Questionably secondary to hypotension versus low blood flow given critical carotid stenosis as above -Resolved - continue to follow clinically   HTN -Prmissive HTN for now per neuro -Slowly improve blood pressure over the next 48 to 72 hours -Add low-dose amlodipine and follow; hydralazine >180/100 -Hold home hydralazine, losartan, metoprolol and Maxzide  -Poorly controlled at baseline, recent admission for hypertensive emergency  HLD -Lipid panel WNL -Continue Lipitor increased dose 40 mg daily   DM, well controlled, diet controlled. -A1C 6.2 -Not on home meds -Will order moderate-scale SSI   Stage 3b CKD -At baseline -Will follow, attempt to avoid nephrotoxic agents    Chronic pancreatitis -Hold cholestyramine, as patient failed swallow evaluation -Resume Creon with meals, when able   Seizure d/o -Continue Keppra -Order EEG   OSA -Not on CPAP   Chronic pain -Continue home meds (Norco)   Mood d/o -Continue Remeron, Effexor   Asthma -Continue Singulair, albuterol -Well controlled  DVT prophylaxis: Lovenox Code Status: Full Family Communication: None available  Status is: Inpatient  Dispo: The patient is from: Home              Anticipated d/c is to: To be determined              Anticipated d/c date is: 48 to 72 hours              Patient currently  not medically stable for discharge  Consultants:  Neurology; Vascular Surgery  Procedures:  Pending above  Antimicrobials:  None  Subjective: None no acute issues or events overnight, symptoms resolved denies headache fever chills nausea vomiting diarrhea  constipation shortness of breath or chest pain.  Objective: Vitals:   04/16/22 2100 04/16/22 2150 04/16/22 2345 04/17/22 0359  BP: (!) 179/77 (!) 207/83 (!) 210/90 (!) 191/77  Pulse: 92 93 95 93  Resp: '13 17 18 17  '$ Temp:  98.1 F (36.7 C) 97.9 F (36.6 C) 97.7 F (36.5 C)  TempSrc:  Oral Oral Oral  SpO2: 95% 97% 96% 95%  Weight:      Height:       No intake or output data in the 24 hours ending 04/17/22 0719  Filed Weights   04/15/22 0900 04/15/22 1004  Weight: 82.8 kg 82.8 kg    Examination:  General:  Pleasantly resting in bed, No acute distress. HEENT:  Normocephalic atraumatic.  Sclerae nonicteric, noninjected.  Extraocular movements intact bilaterally. Neck:  Without mass or deformity.  Trachea is midline. Lungs:  Clear to auscultate bilaterally without rhonchi, wheeze, or rales. Heart:  Regular rate and rhythm.  Without murmurs, rubs, or gallops. Abdomen:  Soft, nontender, nondistended.  Without guarding or rebound. Extremities: Without cyanosis, clubbing, edema Skin:  Warm and dry, no erythema   Data Reviewed: I have personally reviewed following labs and imaging studies  CBC: Recent Labs  Lab 04/15/22 0943 04/15/22 0946  WBC 18.3*  --   NEUTROABS 13.9*  --   HGB 11.4* 12.2  HCT 36.7 36.0  MCV 95.8  --   PLT 224  --     Basic Metabolic Panel: Recent Labs  Lab 04/15/22 0943 04/15/22 0946  NA 135 135  K 3.8 3.9  CL 107 104  CO2 19*  --   GLUCOSE 121* 114*  BUN 17 19  CREATININE 1.68* 1.80*  CALCIUM 8.6*  --     GFR: Estimated Creatinine Clearance: 28 mL/min (A) (by C-G formula based on SCr of 1.8 mg/dL (H)). Liver Function Tests: Recent Labs  Lab 04/15/22 0943  AST 29  ALT 21  ALKPHOS 100  BILITOT 0.8  PROT 6.3*  ALBUMIN 3.2*    No results for input(s): "LIPASE", "AMYLASE" in the last 168 hours. No results for input(s): "AMMONIA" in the last 168 hours. Coagulation Profile: Recent Labs  Lab 04/15/22 0943  INR 1.2    Cardiac  Enzymes: No results for input(s): "CKTOTAL", "CKMB", "CKMBINDEX", "TROPONINI" in the last 168 hours. BNP (last 3 results) No results for input(s): "PROBNP" in the last 8760 hours. HbA1C: Recent Labs    04/16/22 0818  HGBA1C 6.2*   CBG: Recent Labs  Lab 04/15/22 2022 04/16/22 0827 04/16/22 1203 04/16/22 1753 04/17/22 0629  GLUCAP 132* 196* 202* 201* 102*    Lipid Profile: Recent Labs    04/16/22 0331  CHOL 108  HDL 62  LDLCALC 28  TRIG 90  CHOLHDL 1.7    Thyroid Function Tests: No results for input(s): "TSH", "T4TOTAL", "FREET4", "T3FREE", "THYROIDAB" in the last 72 hours. Anemia Panel: No results for input(s): "VITAMINB12", "FOLATE", "FERRITIN", "TIBC", "IRON", "RETICCTPCT" in the last 72 hours. Sepsis Labs: No results for input(s): "PROCALCITON", "LATICACIDVEN" in the last 168 hours.  Recent Results (from the past 240 hour(s))  Urine Culture     Status: Abnormal   Collection Time: 04/08/22 10:59 PM   Specimen: Urine, Clean Catch  Result Value Ref Range Status  Specimen Description URINE, CLEAN CATCH  Final   Special Requests   Final    NONE Performed at [facility], [place] [address] [place], [place] [address]    Culture >=100,000 COLONIES/mL ESCHERICHIA COLI (A)  Final   Report Status 04/10/2022 FINAL  Final   Organism ID, Bacteria ESCHERICHIA COLI (A)  Final      Susceptibility   Escherichia coli - MIC*    AMPICILLIN <=2 SENSITIVE Sensitive     CEFAZOLIN <=4 SENSITIVE Sensitive     CEFEPIME <=0.12 SENSITIVE Sensitive     CEFTRIAXONE <=0.25 SENSITIVE Sensitive     CIPROFLOXACIN <=0.25 SENSITIVE Sensitive     GENTAMICIN <=1 SENSITIVE Sensitive     IMIPENEM <=0.25 SENSITIVE Sensitive     NITROFURANTOIN <=16 SENSITIVE Sensitive     TRIMETH/SULFA <=20 SENSITIVE Sensitive     AMPICILLIN/SULBACTAM <=2 SENSITIVE Sensitive     PIP/TAZO <=4 SENSITIVE Sensitive     * >=100,000 COLONIES/mL ESCHERICHIA COLI         Radiology Studies: VAS [place]  TRANSCRANIAL DOPPLER  Result Date: 04/16/2022  Transcranial Doppler Patient Name:  SPENCER PETERKIN  Date of Exam:   04/16/2022 Medical Rec #: [id number]       Accession #:    [id number] Date of Birth: 1945/12/20       Patient Gender: F Patient Age:   45 years Exam Location:  [facility] Procedure:      VAS [place] TRANSCRANIAL DOPPLER Referring Phys: PRAMOD SETHI --------------------------------------------------------------------------------  Indications: Stroke. Limitations for diagnostic windows: Unable to insonate right transtemporal window. Comparison Study: No prior studies. Performing Technologist: Darlin Coco RDMS, RVT  Examination Guidelines: A complete evaluation includes B-mode imaging, spectral Doppler, color Doppler, and power Doppler as needed of all accessible portions of each vessel. Bilateral testing is considered an integral part of a complete examination. Limited examinations for reoccurring indications may be performed as noted.  +----------+-------------+----------+-----------+------------------+ RIGHT TCD Right VM (cm)Depth (cm)Pulsatility     Comment       +----------+-------------+----------+-----------+------------------+ MCA                                         Unable to insonate +----------+-------------+----------+-----------+------------------+ ACA                                         Unable to insonate +----------+-------------+----------+-----------+------------------+ Term ICA                                    Unable to insonate +----------+-------------+----------+-----------+------------------+ PCA                                         Unable to insonate +----------+-------------+----------+-----------+------------------+ Opthalmic     17.00                 1.55                       +----------+-------------+----------+-----------+------------------+ ICA siphon    50.00                 1.41                        +----------+-------------+----------+-----------+------------------+  Vertebral    -27.00                 1.01                       +----------+-------------+----------+-----------+------------------+  +----------+------------+----------+-----------+----------------------------+ LEFT TCD  Left VM (cm)Depth (cm)Pulsatility          Comment            +----------+------------+----------+-----------+----------------------------+ MCA          73.00                 1.18                                 +----------+------------+----------+-----------+----------------------------+ ACA                                             Unable to insonate      +----------+------------+----------+-----------+----------------------------+ Term ICA     122.00                1.10                                 +----------+------------+----------+-----------+----------------------------+ PCA           9.00                  3.0    No discernable end diastolic +----------+------------+----------+-----------+----------------------------+ Opthalmic     7.00                  3.0    No discernable end diastolic +----------+------------+----------+-----------+----------------------------+ ICA siphon   11.00                  3.0    No discernable end diastolic +----------+------------+----------+-----------+----------------------------+ Vertebral    -21.00                0.73                                 +----------+------------+----------+-----------+----------------------------+  +------------+------+-------+             VM cm Comment +------------+------+-------+ Prox Basilar-45.00        +------------+------+-------+ Dist Basilar-48.00        +------------+------+-------+ Summary:  Absent right temporal and poor left temporal windows limit evauation of anterior circulation.Abnormal left opthalmic wavfeform with elevated left terminal ICA velocities may suggest proximal  left ICA occlusion with collateral flow. Normal mean flow velocities in basilar and both vertebral arteries. *See table(s) above for TCD measurements and observations.  Vascular consult recommended. Diagnosing physician: Antony Contras MD Electronically signed by Antony Contras MD on 04/16/2022 at 3:24:50 PM.    Final    EEG adult  Result Date: 04/16/2022 Lora Havens, MD     04/16/2022  1:27 PM Patient Name: MINNETTA SANDORA MRN: [id number] Epilepsy Attending: Lora Havens Referring Physician/Provider: Katy Apo, NP Date: 04/16/2022 Duration: 22.06 mins Patient history: 76yo F with ams. EEG to evaluate for seizure. Level of alertness: Awake, asleep AEDs during EEG study: LEV Technical aspects: This EEG study was done with scalp electrodes positioned according to the 10-20 International system  of electrode placement. Electrical activity was acquired at a sampling rate of '500Hz'$  and reviewed with a high frequency filter of '70Hz'$  and a low frequency filter of '1Hz'$ . EEG data were recorded continuously and digitally stored. Description: The posterior dominant rhythm consists of 8-9 Hz activity of moderate voltage (25-35 uV) seen predominantly in posterior head regions, symmetric and reactive to eye opening and eye closing.  Sleep was characterized by vertex waves, sleep spindles (12 to 14 Hz), maximal frontocentral region. EEG showed intermittent 3 to 6 Hz theta-delta slowing in left temporo-parietal region. Hyperventilation and photic stimulation were not performed.   ABNORMALITY - Intermittent slow, left temporo-parietal region IMPRESSION: This study is suggestive of cortical dysfunction arising from left temporo-parietal region, nonspecific etiology but could be secondary to underlying structural abnormality, post-ictal state. No seizures or epileptiform discharges were seen throughout the recording. Priyanka Barbra Sarks   CT ANGIO HEAD NECK W WO CM  Result Date: 04/16/2022 CLINICAL DATA:  76 year old female  code stroke presentation yesterday. Punctate right occipital white matter infarct on MRI. EXAM: CT ANGIOGRAPHY HEAD AND NECK TECHNIQUE: Multidetector CT imaging of the head and neck was performed using the standard protocol during bolus administration of intravenous contrast. Multiplanar CT image reconstructions and MIPs were obtained to evaluate the vascular anatomy. Carotid stenosis measurements (when applicable) are obtained utilizing NASCET criteria, using the distal internal carotid diameter as the denominator. RADIATION DOSE REDUCTION: This exam was performed according to the departmental dose-optimization program which includes automated exposure control, adjustment of the mA and/or kV according to patient size and/or use of iterative reconstruction technique. CONTRAST:  19m OMNIPAQUE IOHEXOL 350 MG/ML SOLN COMPARISON:  Head CT and brain MRI yesterday. CTA head and neck 03/14/2022.  Thyroid ultrasound 05/18/2020. FINDINGS: CT HEAD Brain: Stable non contrast CT appearance of the brain. Small right occipital lobe white matter infarct by MRI is occult by CT. Calvarium and skull base: No acute osseous abnormality identified. Paranasal sinuses: Visualized paranasal sinuses and mastoids are stable and well aerated. Orbits: No acute orbit or scalp soft tissue finding. CTA NECK Skeleton: Right TMJ degeneration. Lower cervical spine disc and endplate degeneration. No acute osseous abnormality identified. Upper chest: Centrilobular emphysema. Patchy right lung ground-glass opacity has regressed since last month. No superior mediastinal lymphadenopathy. Other neck: Stable neck soft tissues. Thyroid nodules on the right side This has been evaluated on previous imaging. (ref: J Am Coll Radiol. 2015 Feb;12(2): 143-50). Aortic arch: Severe Calcified aortic atherosclerosis. 3 vessel arch configuration. Right carotid system: Brachiocephalic artery and right carotid atherosclerosis appear not significantly changed, including  high-grade stenosis of the right ICA bulb approaching a radiographic string sign on series 9, image 99 and series 11, image 199. Right ICA remains patent to the skull base. Left carotid system: Stable atherosclerotic left CCA with high-grade origin stenosis and chronically occluded cervical left ICA with no reconstitution to the skull base. Vertebral arteries: Proximal right subclavian artery plaque with up to 50% stenosis. Moderate stenosis right vertebral artery origin is stable on series 12, image 128. Right vertebral artery is otherwise negative to the skull base. Stable proximal left subclavian artery atherosclerosis. Normal left vertebral artery origin. Left vertebral is patent to the skull base without stenosis. CTA HEAD Posterior circulation: Codominant distal vertebral arteries with V4 calcified plaque but only mild distal vertebral stenosis. Normal vertebrobasilar junction and basilar artery. Normal SCA and PCA origins. Left PCA branches are within normal limits. Mild right PCA irregularity is stable. Left posterior communicating artery is normal.  Anterior circulation: Right ICA siphon is patent with calcified plaque and mild to moderate right siphon stenosis. Left ICA siphon is reconstituted as before from the left PCOM. Patent carotid termini, MCA and ACA origins. Dominant right and diminutive left ACA A1 segments again noted. Anterior communicating artery and bilateral ACA branches are within normal limits. Right MCA M1 segment and bifurcation remain patent. Right MCA branches are stable with mild irregularity. Left MCA M1 segment bifurcates early without stenosis. Left MCA branches are stable with mild irregularity in the posterior most M2 division. Venous sinuses: Early contrast timing, not evaluated. Anatomic variants: Dominant right and diminutive left ACA A1 segments. Review of the MIP images confirms the above findings IMPRESSION: 1. Stable CTA Head And Neck since last month, negative for large  vessel occlusion. But Positive for: - chronically occluded Left ICA in the neck. Left ICA terminus is reconstituted by the PComm. - Severe underlying cervical carotid atherosclerosis. High-grade Right ICA bulb stenosis approaching a RADIOGRAPHIC STRING SIGN. - up to moderate stenosis of the Right Vertebral Artery origin, Right ICA siphon. - Aortic Atherosclerosis (ICD10-I70.0 2. Stable non contrast CT appearance of the brain. Small right occipital lobe white matter infarct by MRI remains occult by CT. 3. Emphysema (ICD10-J43.9). Electronically Signed   By: Genevie Ann M.D.   On: 04/16/2022 11:14   MR BRAIN WO CONTRAST  Result Date: 04/15/2022 CLINICAL DATA:  Neuro deficit, acute, stroke suspected EXAM: MRI HEAD WITHOUT CONTRAST TECHNIQUE: Multiplanar, multiecho pulse sequences of the brain and surrounding structures were obtained without intravenous contrast. COMPARISON:  04/08/2022 FINDINGS: Brain: Punctate focus of reduced diffusion is present in the right occipital lobe near the occipital horn. No evidence hemorrhage. Ventricles and sulci are stable in size and configuration. Patchy and confluent areas of T2 hyperintensity in the supratentorial and pontine white matter are nonspecific but probably reflects stable chronic microvascular ischemic changes. There is no intracranial mass or mass effect. No hydrocephalus or extra-axial collection. Vascular: Loss of the left ICA flow void to the clinoid is unchanged. Skull and upper cervical spine: Normal marrow signal is preserved. Sinuses/Orbits: No new finding. Other: Sella is partially empty.  Mastoid air cells are clear. IMPRESSION: Punctate right occipital acute infarct. Otherwise no significant change since recent prior study. Electronically Signed   By: Macy Mis M.D.   On: 04/15/2022 10:23   CT HEAD CODE STROKE WO CONTRAST  Result Date: 04/15/2022 CLINICAL DATA:  Code stroke.  Neuro deficit, acute, stroke suspected EXAM: CT HEAD WITHOUT CONTRAST  TECHNIQUE: Contiguous axial images were obtained from the base of the skull through the vertex without intravenous contrast. RADIATION DOSE REDUCTION: This exam was performed according to the departmental dose-optimization program which includes automated exposure control, adjustment of the mA and/or kV according to patient size and/or use of iterative reconstruction technique. COMPARISON:  03/14/2022 FINDINGS: Brain: No acute intracranial hemorrhage, mass effect, or edema. No new loss of gray-white differentiation. Ventricles and sulci are stable in size and configuration. Patchy hypoattenuation in the supratentorial white matter probably reflects stable chronic microvascular ischemic changes. No extra-axial collection. Vascular: No hyperdense vessel. Skull: Unremarkable. Sinuses/Orbits: No acute finding. Other: Mastoid air cells are clear. ASPECTS ([place] Stroke Program Early CT Score) - Ganglionic level infarction (caudate, lentiform nuclei, internal capsule, insula, M1-M3 cortex): 7 - Supraganglionic infarction (M4-M6 cortex): 3 Total score (0-10 with 10 being normal): 10 IMPRESSION: There is no acute intracranial hemorrhage or evidence of acute infarction. ASPECT score is 10. These results were communicated to  Dr. Lorrin Goodell at 9:48 am on 04/15/2022 by text page via the [facility] messaging system. Electronically Signed   By: Macy Mis M.D.   On: 04/15/2022 09:57    Scheduled Meds:  aspirin  81 mg Oral Daily   atorvastatin  40 mg Oral Daily   clopidogrel  75 mg Oral Daily   enoxaparin (LOVENOX) injection  30 mg Subcutaneous Q24H   insulin aspart  0-15 Units Subcutaneous TID WC   levETIRAcetam  500 mg Oral BID   lipase/protease/amylase  36,000 Units Oral Q breakfast   mirtazapine  15 mg Oral QHS   montelukast  10 mg Oral Daily   pantoprazole  40 mg Oral BID   venlafaxine XR  75 mg Oral Daily   Continuous Infusions:  sodium chloride Stopped (04/15/22 2030)     LOS: 1 day   Time spent:  94mn  Monterrius Cardosa C Delaine Hernandez, DO [facility]  If 7PM-7AM, please contact night-coverage [email or web address]  04/17/2022, 7:19 AM

## 2022-04-17 NOTE — Progress Notes (Signed)
STROKE TEAM PROGRESS NOTE   INTERVAL HISTORY Patient is seen in her room with no family at the bedside.  Patient has no neurological complaints.  CT angiogram yesterday showed near occlusive right carotid stenosis with known chronic left carotid occlusion however carotid ultrasound showed only 60 to 79% right ICA stenosis.  Patient has been seen by vascular surgery and Dr. Unk Lightning plans to do TCAR procedure for urgent revascularization tomorrow.  Transcranial Doppler study was suboptimal due to poor windows but abnormal left ophthalmic waveform suggestive proximal left carotid occlusion  CBC:  Recent Labs  Lab 04/15/22 0943 04/15/22 0946  WBC 18.3*  --   NEUTROABS 13.9*  --   HGB 11.4* 12.2  HCT 36.7 36.0  MCV 95.8  --   PLT 224  --    Basic Metabolic Panel:  Recent Labs  Lab 04/15/22 0943 04/15/22 0946  NA 135 135  K 3.8 3.9  CL 107 104  CO2 19*  --   GLUCOSE 121* 114*  BUN 17 19  CREATININE 1.68* 1.80*  CALCIUM 8.6*  --    Lipid Panel:  Recent Labs  Lab 04/16/22 0331  CHOL 108  TRIG 90  HDL 62  CHOLHDL 1.7  VLDL 18  LDLCALC 28   HgbA1c:  Recent Labs  Lab 04/16/22 0818  HGBA1C 6.2*   Urine Drug Screen:  Recent Labs  Lab 04/15/22 1218  LABOPIA POSITIVE*  COCAINSCRNUR NONE DETECTED  LABBENZ NONE DETECTED  AMPHETMU NONE DETECTED  THCU NONE DETECTED  LABBARB NONE DETECTED    Alcohol Level  Recent Labs  Lab 04/15/22 0943  ETH <10    IMAGING past 24 hours VAS [place] TRANSCRANIAL DOPPLER  Result Date: 04/16/2022  Transcranial Doppler Patient Name:  [name]  Date of Exam:   04/16/2022 Medical Rec #: [id number]       Accession #:    [id number] Date of Birth: 10-26-1945       Patient Gender: F Patient Age:   76 years Exam Location:  [facility] Procedure:      VAS [place] TRANSCRANIAL DOPPLER Referring Phys: Emrick Hensch --------------------------------------------------------------------------------  Indications: Stroke. Limitations for diagnostic  windows: Unable to insonate right transtemporal window. Comparison Study: No prior studies. Performing Technologist: Darlin Coco RDMS, RVT  Examination Guidelines: A complete evaluation includes B-mode imaging, spectral Doppler, color Doppler, and power Doppler as needed of all accessible portions of each vessel. Bilateral testing is considered an integral part of a complete examination. Limited examinations for reoccurring indications may be performed as noted.  +----------+-------------+----------+-----------+------------------+ RIGHT TCD Right VM (cm)Depth (cm)Pulsatility     Comment       +----------+-------------+----------+-----------+------------------+ MCA                                         Unable to insonate +----------+-------------+----------+-----------+------------------+ ACA                                         Unable to insonate +----------+-------------+----------+-----------+------------------+ Term ICA                                    Unable to insonate +----------+-------------+----------+-----------+------------------+ PCA  Unable to insonate +----------+-------------+----------+-----------+------------------+ Opthalmic     17.00                 1.55                       +----------+-------------+----------+-----------+------------------+ ICA siphon    50.00                 1.41                       +----------+-------------+----------+-----------+------------------+ Vertebral    -27.00                 1.01                       +----------+-------------+----------+-----------+------------------+  +----------+------------+----------+-----------+----------------------------+ LEFT TCD  Left VM (cm)Depth (cm)Pulsatility          Comment            +----------+------------+----------+-----------+----------------------------+ MCA          73.00                 1.18                                  +----------+------------+----------+-----------+----------------------------+ ACA                                             Unable to insonate      +----------+------------+----------+-----------+----------------------------+ Term ICA     122.00                1.10                                 +----------+------------+----------+-----------+----------------------------+ PCA           9.00                  3.0    No discernable end diastolic +----------+------------+----------+-----------+----------------------------+ Opthalmic     7.00                  3.0    No discernable end diastolic +----------+------------+----------+-----------+----------------------------+ ICA siphon   11.00                  3.0    No discernable end diastolic +----------+------------+----------+-----------+----------------------------+ Vertebral    -21.00                0.73                                 +----------+------------+----------+-----------+----------------------------+  +------------+------+-------+             VM cm Comment +------------+------+-------+ Prox Basilar-45.00        +------------+------+-------+ Dist Basilar-48.00        +------------+------+-------+ Summary:  Absent right temporal and poor left temporal windows limit evauation of anterior circulation.Abnormal left opthalmic wavfeform with elevated left terminal ICA velocities may suggest proximal left ICA occlusion with collateral flow. Normal mean flow velocities in basilar and both vertebral arteries. *See table(s) above for TCD measurements and observations.  Vascular consult recommended. Diagnosing physician: Antony Contras MD Electronically signed by  Antony Contras MD on 04/16/2022 at 3:24:50 PM.    Final    EEG adult  Result Date: 04/16/2022 Lora Havens, MD     04/16/2022  1:27 PM Patient Name: [name] MRN: [id number] Epilepsy Attending: Lora Havens Referring Physician/Provider: Katy Apo, NP Date: 04/16/2022 Duration: 22.06 mins Patient history: 76yo F with ams. EEG to evaluate for seizure. Level of alertness: Awake, asleep AEDs during EEG study: LEV Technical aspects: This EEG study was done with scalp electrodes positioned according to the 10-20 International system of electrode placement. Electrical activity was acquired at a sampling rate of '500Hz'$  and reviewed with a high frequency filter of '70Hz'$  and a low frequency filter of '1Hz'$ . EEG data were recorded continuously and digitally stored. Description: The posterior dominant rhythm consists of 8-9 Hz activity of moderate voltage (25-35 uV) seen predominantly in posterior head regions, symmetric and reactive to eye opening and eye closing.  Sleep was characterized by vertex waves, sleep spindles (12 to 14 Hz), maximal frontocentral region. EEG showed intermittent 3 to 6 Hz theta-delta slowing in left temporo-parietal region. Hyperventilation and photic stimulation were not performed.   ABNORMALITY - Intermittent slow, left temporo-parietal region IMPRESSION: This study is suggestive of cortical dysfunction arising from left temporo-parietal region, nonspecific etiology but could be secondary to underlying structural abnormality, post-ictal state. No seizures or epileptiform discharges were seen throughout the recording. [place]    PHYSICAL EXAM General:  Alert, well-nourished, well-developed patient in no acute distress Respiratory:  Regular, unlabored respirations on room air  NEURO:  Mental Status: AA&Ox3  Speech/Language: speech is without dysarthria or aphasia.  Repetition, fluency, and comprehension intact.  Cranial Nerves:  II: PERRL. Visual fields full.  III, IV, VI: EOMI. Eyelids elevate symmetrically.  V: Sensation is intact to light touch and symmetrical to face.  VII: Smile is symmetrical.   VIII: hearing intact to voice. IX, X: Phonation is normal.  OH:[id number] shrug 5/5. XII: tongue is midline  without fasciculations. Motor: 5/5 strength to all muscle groups tested.  Tone: is normal and bulk is normal Sensation- Intact to light touch bilaterally.  Coordination: FTN intact bilaterally.No drift.  Gait- deferred   ASSESSMENT/PLAN [name] is a 76 y.o. female with history of chronic L ICA occlusion, R ICA stenosis (seeing vascular surgery as an outpatient for intervention), seizures, stroke, DM, asthma, COPD, PAD, CKD stage 4 and HTN presenting with sudden onset aphasia, right arm and leg numbness, dizziness, wavy lines in her vision and a headache.  Symptoms have fully resolved.  She has a history of migraines and has had vision changes with headaches before.  MRI show punctate right occipital stroke which does not explain her symptoms.  Silent punctate right occipital infarct along with left sided TIA vs. Complicated migraine..  Patient with chronic left carotid occlusion with now worsening right carotid preocclusive stenosis Code Stroke CT head No acute abnormality. ASPECTS 10.    CTA head & neck chronically occluded L ICA, severe right ICA stenosis approaching radiographic string sign, right vertebral artery stenosis  MRI  punctate right occipital infarct TCD pending 2D Echo EF 45-50%, mild LVH, apical hypokinesis, interatrial septum not well visualized LDL 28 HgbA1c 6.2 VTE prophylaxis - lovenox    Diet   Diet regular Room service appropriate? Yes; Fluid consistency: Thin   aspirin 81 mg daily prior to admission, now on aspirin 81 mg daily and clopidogrel 75 mg daily for three weeks, then  clopidogrel alone Therapy recommendations:  pending Disposition:  pending, likely home  Hypertension Home meds:  amlodipine 10 mg daily, metoprolol XL 100 mg daily, losartan 50 mg daily Stable Permissive hypertension (OK if < 220/120) but gradually normalize in 5-7 days Long-term BP goal normotensive  Hyperlipidemia Home meds:  atorvastatin 20 mg daily, resumed in  hospital LDL 28, goal < 70 High intensity statin not indicated as LDL below goal Continue statin at discharge  Diabetes type II Controlled Home meds:  none HgbA1c 6.2, goal < 7.0 CBGs Recent Labs    04/16/22 1753 04/17/22 0629 04/17/22 1219  GLUCAP 201* 102* 126*    SSI  Seizure disorder EEG to rule out seizure activity Continue home Keppra  Other Stroke Risk Factors Advanced Age >/= 34  Former cigarette smoker Obesity, Body mass index is 32.34 kg/m., BMI >/= 30 associated with increased stroke risk, recommend weight loss, diet and exercise as appropriate  Hx stroke  Other Active Problems none  Hospital day # 1   Patient presented with a right-sided paresthesias along with headache and blurred vision possibly complicated migraine episode since she had similar episode before was elected [facility] TIA due to chronic left ICA occlusion with collaterals.  She also has a silent right occipital infarct but CT angiogram  of neck shows progressive extracranial right carotid stenosis hence agree with plan for emergent right carotid revascularization.  Avoid hypotension and keep systolic blood pressure 827-078 range.  Continue dual antiplatelet therapy aspirin and Plavix and aggressive risk factor modification.  Long discussion with patient and answered questions.  Discussed with Dr. Avon Gully.  Greater than 50% time during this 35-minute visit was spent on counseling and coordination of care about her left hemispheric TIA as well as progressive carotid stenosis and discussion about revascularization plans and answering questions.  Antony Contras, MD Medical Director [facility] Pager: [phone] 04/17/2022 1:16 PM   To contact Stroke Continuity provider, please refer to [email or web address] After hours, contact General Neurology

## 2022-04-17 NOTE — Evaluation (Signed)
Speech Language Pathology Evaluation Patient Details Name: [name] MRN: [id number] DOB: 1946-03-05 Today's Date: 04/17/2022 Time: 2536-6440 SLP Time Calculation (min) (ACUTE ONLY): 20 min  Problem List:  Patient Active Problem List   Diagnosis Date Noted   Acute CVA (cerebrovascular accident) ([place]) 04/15/2022   Hypertensive urgency 04/09/2022   History of seizures as a child 04/09/2022   Chronic combined systolic and diastolic CHF (congestive heart failure) ([place]) 04/09/2022   Pyuria 04/09/2022   CKD (chronic kidney disease) stage 4, GFR 15-29 ml/min ([place]) 04/09/2022   Sepsis ([place]) 12/26/2021   UTI (urinary tract infection) 12/26/2021   Aspiration pneumonia ([place]) 12/26/2021   Acute renal failure superimposed on stage 4 chronic kidney disease ([place]) 12/26/2021   Acute on chronic systolic CHF (congestive heart failure) ([place]) 12/26/2021   Stroke ([place]) 12/25/2021   Pharyngeal dysphagia 07/12/2021   Pressure ulcer of sacral region 07/12/2021   Wound of abdomen 07/12/2021   Closed fracture of distal end of left radius 06/15/2021   Degeneration of lumbar intervertebral disc 04/23/2021   Hyponatremia 34/74/2595   Metabolic acidosis, NAG, failure of bicarbonate regeneration 11/16/2020   Nausea & vomiting 11/16/2020   Acute-on-chronic kidney injury ([place]) 11/16/2020   Abdominal aortic atherosclerosis ([place]) 06/01/2020   Acquired thrombophilia ([place]) 06/01/2020   Insomnia 05/08/2020   Neck pain 01/11/2020   Referred otalgia of both ears 01/11/2020   Frequency of micturition 12/16/2019   Orthostatic hypotension 12/16/2019   Urinary tract infectious disease 12/16/2019   Pneumonia due to coronavirus disease 2019 11/17/2019   Acute combined systolic and diastolic heart failure ([place]) 10/29/2019   COVID-19 10/29/2019   Hyperkalemia 10/16/2019   Bilateral pneumonia 63/87/5643   Acute metabolic encephalopathy 32/95/1884   Noninfective gastroenteritis and colitis, unspecified 09/01/2019    Hypo-osmolality and hyponatremia 08/04/2019   Spasm 06/03/2019   Seizures ([place]) 01/07/2019   Cerebrovascular accident (CVA) due to occlusion of left carotid artery (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  Ileostomy 10/18/2017 10/14/2017   Ischemic colitis ([place]) 10/14/2017   Acute respiratory failure with hypoxemia ([place]) 10/14/2017   Acute post-operative pain 10/14/2017   Acute kidney injury superimposed on CKD ([place]) 10/14/2017   Anemia 10/14/2017   Benign paroxysmal positional vertigo 07/29/2017   Nocturia more than twice per night 05/07/2017   Diabetes due to underlying condition w oth circulatory comp ([place]) 05/07/2017   PVD (peripheral vascular disease) ([place]) 05/07/2017   Poor sleep hygiene 05/07/2017   Paradoxical insomnia 05/07/2017   Abnormal feces 04/10/2017   Hilar lymphadenopathy    Hemoptysis 02/19/2017   Pulmonary nodules/lesions, multiple 02/19/2017   Tobacco use 02/19/2017   Disorder of arteries and arterioles, unspecified ([place]) 01/31/2017   Lung field abnormal 01/31/2017   RUQ abdominal pain 01/13/2017   Restless legs syndrome  11/28/2016   Left-sided extracranial carotid artery occlusion 11/26/2016   PAOD  (peripheral arterial occlusive disease) ([place]) 11/26/2016   Encounter for routine follow-up after surgery of the circulatory system 11/26/2016   Abnormal weight loss 01/29/2016   Allergic rhinitis 01/29/2016   Chest pain with moderate risk of acute coronary syndrome 10/27/2015   Other slipping, tripping and stumbling without falling, initial encounter 07/28/2015   Acid indigestion 12/31/2013   Pain in left hip 08/26/2013   Screening for gout 05/13/2013   Disorder of skin or subcutaneous tissue 12/22/2012   Difficult or painful urination 09/02/2012   Peripheral blood vessel disorder ([place]) 05/11/2012   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[place]) 05/11/2012   Follow-up examination, following unspecified surgery 05/11/2012   Occlusion and stenosis of carotid artery without mention of cerebral infarction 04/14/2012   Fatigue 09/24/2011   Gonalgia 07/04/2011   Avitaminosis D 05/02/2011   Chronic pancreatitis ([place]) 04/30/2011   Vitamin B12 deficiency 04/30/2011   Intestinal motility disorder 04/30/2011   Fatty liver 04/30/2011   Adiposity 04/30/2011   Other and unspecified general psychiatric examination 04/26/2011   Abdominal pain 04/09/2011   Iron deficiency anemia, unspecified  04/09/2011   Other general symptoms  04/09/2011   Asthma 04/09/2011   Type 2 diabetes mellitus with diabetic peripheral angiopathy without gangrene ([place]) 01/24/2011   Abnormal weight gain 11/30/2010   Dyslipidemia    Dizziness    Right-sided extracranial carotid artery stenosis    Normal nuclear stress test    Feeling bilious 07/11/2010   Underimmunization status 07/11/2010   Pancreas (digestive gland) works poorly 05/30/2010   B12 deficiency 03/19/2010   DVT 03/16/2010   BLIND LOOP SYNDROME 03/16/2010   Diarrhea 03/16/2010   Chronic depression 02/23/2010   Major depression, single episode 02/23/2010   Cough 11/17/2009   Plantar verruca 11/17/2009   Arteriosclerosis of coronary artery 11/16/2009    Claudication ([place]) 11/16/2009   Compulsive tobacco user syndrome 11/16/2009   HLD (hyperlipidemia) 11/16/2009   Nontoxic single thyroid nodule 11/16/2009   Obstructive apnea 11/16/2009   Radial styloid tenosynovitis 11/16/2009   LBP (low back pain) 11/16/2009   Gastroparesis 11/16/2009   LAXATIVE ABUSE 12/17/2007   Accelerated hypertension 12/17/2007   Acid reflux 12/17/2007   HIATAL HERNIA 12/17/2007   IBS 12/17/2007   MELANOSIS COLI 12/17/2007   ARTHRITIS 12/17/2007   HEPATOMEGALY 12/17/2007   Past Medical History:  Past Medical History:  Diagnosis Date   Anxiety    Arthritis    back    Blind loop syndrome    Clotting disorder ([place])    Hx Clot in leg per pt    Colostomy present [facility])    Coronary artery disease    a. known 60-70% mid-LAD stenosis with caths in 1999, 2002, 2006, and 2012 showing stable anatomy b. 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[place])    Diabetes mellitus    Diverticulosis    Dizziness    chronic   Dyslipidemia    Fatty liver    GERD (gastroesophageal reflux disease)    hiatial hernia   Heart murmur    Hiatal hernia    Hypertension    Irritable bowel syndrome    Pancreatitis, chronic ([place])    Peripheral vascular disease ([place]) 02/20/2010   s/p stent of left SFA; carotid stenosis   Seizure ([place]) 11/04/2018   last 1 yr 2020 per pt unsure month    Sleep apnea    no cpap  Stroke [facility])    Thyroid nodule    Vitamin B12 deficiency    Past Surgical History:  Past Surgical History:  Procedure Laterality Date   ABDOMINAL HYSTERECTOMY     partial   AUGMENTATION MAMMAPLASTY     bilateral 1976 retro    BREAST ENHANCEMENT SURGERY     CARDIAC CATHETERIZATION  07/07/98;08/31/01;03/04/05   60 -70% LAD   CATARACT EXTRACTION Bilateral    COLON RESECTION N/A 10/14/2017   Procedure: EXPLORATORY LAPAROTOMY, EXTENDED RIGHT COLECTOMY; APPLICATION OF ABDOMINAL VACUUM DRESSING;  Surgeon: Jovita Kussmaul, MD;  Location: [facility] ORS;  Service:  General;  Laterality: N/A;   COLONOSCOPY     ENDOBRONCHIAL ULTRASOUND Bilateral 02/24/2017   Procedure: ENDOBRONCHIAL ULTRASOUND;  Surgeon: Collene Gobble, MD;  Location: [facility] ENDOSCOPY;  Service: Cardiopulmonary;  Laterality: Bilateral;   FEMORAL ARTERY STENT     Left leg   LAPAROTOMY N/A 10/16/2017   Procedure: EXPLORATORY LAPAROTOMY, PARTIAL OMENTECTOMY, RESECTION ISCHEMIC ILEUM 956LO, APPLICATION OF VAC ABDOMINAL DRESSING;  Surgeon: Armandina Gemma, MD;  Location: [facility] ORS;  Service: General;  Laterality: N/A;   LAPAROTOMY N/A 10/18/2017   Procedure: RE-EXPLORATION OF ABDOMEN, ILEOSTOMY CREATION;  Surgeon: Stark Klein, MD;  Location: [facility]S;  Service: General;  Laterality: N/A;   LUNG SURGERY  03/2017   Benign polyps removed   PATELLA REALIGNMENT Left    POLYPECTOMY     SIGMOIDOSCOPY     TONSILLECTOMY     HPI:  [name] is a 76 y.o. 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   She went to the doctor and she got numb in her R arm.  She is supposed to have carotid surgery coming up (not yet set up) and saw her PCP about this today.  She also has an appointment with cardiology on Wednesday  She had a frontal headache today, no longer present significantly.  +visual blurriness.  +aphasia at the [place] office; MRI revealed on 04/15/22 Punctate right occipital acute infarct.   Assessment / Plan / Recommendation Clinical Impression  Patient presenting with a mild-moderate cognitive impairment as per this evaluation. Her main impairments are in delayed recall, selective attention and mildly complex problem solving. The SLUMS exam was administered and patient received a score of 19 out of possible 30 which is well below average range and indicating a cognitive impairment. Majority of points lost were in repeating  numbers in backwards order and recall after short story read aloud to her. After test, patient said she had difficulty following the story. As patient lives alone and manages finances and medications, SLP is recommending [place] SLP to ensure she is able to perform all ADL's independently.    SLP Assessment  SLP Recommendation/Assessment: All further Speech Lanaguage Pathology  needs can be addressed in the next venue of care SLP Visit Diagnosis: Cognitive communication deficit (R41.841)    Recommendations for follow up therapy are one component of a multi-disciplinary discharge planning process, led by the attending physician.  Recommendations may be updated based on patient status, additional functional criteria and insurance authorization.    Follow Up Recommendations  Home health SLP    Assistance Recommended at Discharge  Intermittent Supervision/Assistance  Functional Status Assessment Patient has had a recent decline in their functional status and demonstrates the ability to make significant improvements in function in a reasonable and predictable amount  of time.  Frequency and Duration           SLP Evaluation Cognition  Overall Cognitive Status: Impaired/Different from baseline Arousal/Alertness: Awake/alert Orientation Level: Oriented X4 Year: 2023 Month: July Day of Week: Correct Attention: Sustained;Selective Sustained Attention: Appears intact Selective Attention: Impaired Selective Attention Impairment: Verbal complex Memory: Impaired Memory Impairment: Retrieval deficit;Storage deficit Awareness: Appears intact Problem Solving: Impaired Problem Solving Impairment: Verbal complex Safety/Judgment: Appears intact       Comprehension  Auditory Comprehension Overall Auditory Comprehension: Appears within functional limits for tasks assessed    Expression Expression Primary Mode of Expression: Verbal Verbal Expression Overall Verbal Expression: Appears within  functional limits for tasks assessed   Oral / Motor  Oral Motor/Sensory Function Overall Oral Motor/Sensory Function: Other (comment) (lingual trauma from prior hospitalization)            Sonia Baller, MA, CCC-SLP Speech Therapy

## 2022-04-17 NOTE — Progress Notes (Signed)
Initial Nutrition Assessment  DOCUMENTATION CODES:  Not applicable  INTERVENTION:  Continue current dit as ordered, encourage PO intake MVI with minerals daily Monitor intake trends to determine if pt would benefit from nutrition supplements   NUTRITION DIAGNOSIS:  Increased nutrient needs related to chronic illness (chronic pancreatitis, CHF) as evidenced by estimated needs.  GOAL:  Patient will meet greater than or equal to 90% of their needs  MONITOR:  PO intake, Labs, I & O's  REASON FOR ASSESSMENT:  Consult Assessment of nutrition requirement/status  ASSESSMENT:  Pt with hx of CAD, GERD, IBS, HLD, HTN, CHF, CKD4, DM type 2, fatty liver, chronic pancreatitis, PVD, and hx CVA presented to ED from MD appointment after developing blurriness, numbness, and aphasia.  Pt worked up and imaging in ED shows aright occipital stroke. Pt has known chronic left ICA occlusion with near occlusive right ICA stenosis. During recent admission, right carotid revascularization was recommended. Vascular surgery plans to perform procedure 7/6.  Pt working with SLP at the time of assessment, will follow up for physical exam and nutrition hx.   Per chart review, pt's weight is stable and intake appears adequate so far this admission.   Average Meal Intake: 7/5: 90% intake x 1 recorded meal  Nutritionally Relevant Medications: Scheduled Meds:  atorvastatin  40 mg Oral Daily   insulin aspart  0-15 Units Subcutaneous TID WC   lipase/protease/amylase  36,000 Units Oral Q breakfast   mirtazapine  15 mg Oral QHS   pantoprazole  40 mg Oral BID   PRN Meds: ondansetron, senna-docusate  Labs Reviewed: Creatinine 1.8 CBG ranges from 102-202 mg/dL over the last 24 hours HgbA1c 6.2%  NUTRITION - FOCUSED PHYSICAL EXAM: Defer to in-person assessment  Diet Order:   Diet Order             Diet regular Room service appropriate? Yes; Fluid consistency: Thin  Diet effective now                    EDUCATION NEEDS:  No education needs have been identified at this time  Skin:  Skin Assessment: Reviewed RN Assessment  Last BM:  unsure  Height:  Ht Readings from Last 1 Encounters:  04/15/22 '5\' 3"'$  (1.6 m)   Weight:  Wt Readings from Last 1 Encounters:  04/15/22 82.8 kg    Ideal Body Weight:  52.3 kg  BMI:  Body mass index is 32.34 kg/m.  Estimated Nutritional Needs:  Kcal:  1500-1700 kcal/d Protein:  75-90 g/d Fluid:  1.5-1.8L/d    Ranell Patrick, RD, LDN Clinical Dietitian RD pager # available in [facility]  After hours/weekend pager # available in [facility]

## 2022-04-17 NOTE — TOC Initial Note (Signed)
Transition of Care [facility]) - Initial/Assessment Note    Patient Details  Name: [name] MRN: [id number] Date of Birth: [birth year]  Transition [facility]) CM/SW Contact:    Pollie Friar, RN Phone Number: 04/17/2022, 12:35 PM  Clinical Narrative:                 Pt is from home alone but her daughter in law and son live near by. Pt states her daughter in law can provide some supervision after d/c. Pt asking about a tub bench. Her insurance will not cover this DME so pt will purchase it outside the hospital setting.  No f/u per PT/OT but they may need to re-see after her procedure tomorrow.  Pt drives self to appointments and her DIL helps with her medications.  ToC following.  Expected Discharge Plan: Home/Self Care Barriers to Discharge: Continued Medical Work up   Patient Goals and CMS Choice        Expected Discharge Plan and Services Expected Discharge Plan: Home/Self Care   Discharge Planning Services: CM Consult   Living arrangements for the past 2 months: Single Family Home                                      Prior Living Arrangements/Services Living arrangements for the past 2 months: Single Family Home Lives with:: Self Patient language and need for interpreter reviewed:: Yes Do you feel safe going back to the place where you live?: Yes            Criminal Activity/Legal Involvement Pertinent to Current Situation/Hospitalization: No - Comment as needed  Activities of Daily Living Home Assistive Devices/Equipment: None ADL Screening (condition at time of admission) Patient's cognitive ability adequate to safely complete daily activities?: Yes Is the patient deaf or have difficulty hearing?: No Does the patient have difficulty seeing, even when wearing glasses/contacts?: No Does the patient have difficulty concentrating, remembering, or making decisions?: No Patient able to express need for assistance with ADLs?: Yes Does the patient have  difficulty dressing or bathing?: No Independently performs ADLs?: Yes (appropriate for developmental age) Does the patient have difficulty walking or climbing stairs?: No Weakness of Legs: Both Weakness of Arms/Hands: None  Permission Sought/Granted                  Emotional Assessment Appearance:: Appears stated age Attitude/Demeanor/Rapport: Engaged Affect (typically observed): Accepting Orientation: : Oriented to Self, Oriented to Place, Oriented to  Time, Oriented to Situation   Psych Involvement: No (comment)  Admission diagnosis:  Acute CVA (cerebrovascular accident) ([place]) [I63.9] Cerebrovascular accident (CVA), unspecified mechanism ([place]) [I63.9] Patient Active Problem List   Diagnosis Date Noted   Acute CVA (cerebrovascular accident) ([place]) 04/15/2022   Hypertensive urgency 04/09/2022   History of seizures as a child 04/09/2022   Chronic combined systolic and diastolic CHF (congestive heart failure) ([place]) 04/09/2022   Pyuria 04/09/2022   CKD (chronic kidney disease) stage 4, GFR 15-29 ml/min ([place]) 04/09/2022   Sepsis ([place]) 12/26/2021   UTI (urinary tract infection) 12/26/2021   Aspiration pneumonia ([place]) 12/26/2021   Acute renal failure superimposed on stage 4 chronic kidney disease ([place]) 12/26/2021   Acute on chronic systolic CHF (congestive heart failure) ([place]) 12/26/2021   Stroke ([place]) 12/25/2021   Pharyngeal dysphagia 07/12/2021   Pressure ulcer of sacral region 07/12/2021   Wound of abdomen 07/12/2021   Closed fracture  of distal end of left radius 06/15/2021   Degeneration of lumbar intervertebral disc 04/23/2021   Hyponatremia 16/07/9603   Metabolic acidosis, NAG, failure of bicarbonate regeneration 11/16/2020   Nausea & vomiting 11/16/2020   Acute-on-chronic kidney injury ([place]) 11/16/2020   Abdominal aortic atherosclerosis ([place]) 06/01/2020   Acquired thrombophilia ([place]) 06/01/2020   Insomnia 05/08/2020   Neck pain 01/11/2020   Referred otalgia of both  ears 01/11/2020   Frequency of micturition 12/16/2019   Orthostatic hypotension 12/16/2019   Urinary tract infectious disease 12/16/2019   Pneumonia due to coronavirus disease 2019 11/17/2019   Acute combined systolic and diastolic heart failure ([place]) 10/29/2019   COVID-19 10/29/2019   Hyperkalemia 10/16/2019   Bilateral pneumonia 54/06/8118   Acute metabolic encephalopathy 14/78/2956   Noninfective gastroenteritis and colitis, unspecified 09/01/2019   Hypo-osmolality and hyponatremia 08/04/2019   Spasm 06/03/2019   Seizures ([place]) 01/07/2019   Cerebrovascular accident (CVA) due to occlusion of left carotid artery ([place]) 01/07/2019   CAD, multiple vessel 01/07/2019   H/O ischemic bowel disease 01/07/2019   Protein calorie malnutrition ([place]) 01/04/2019   Acute bronchitis 12/04/2018   Oral phase dysphagia 12/04/2018   Personal history of transient ischemic attack (TIA), and cerebral infarction without residual deficits 11/11/2018   CKD (chronic kidney disease) stage 3, GFR 30-59 ml/min 11/04/2018   Seizure ([place]) 11/03/2018   Anorectal disorder 06/08/2018   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  Ileostomy 10/18/2017 10/14/2017   Ischemic colitis ([place]) 10/14/2017   Acute respiratory failure with hypoxemia ([place]) 10/14/2017   Acute post-operative pain 10/14/2017   Acute kidney injury superimposed on CKD ([place]) 10/14/2017   Anemia 10/14/2017   Benign paroxysmal positional vertigo 07/29/2017   Nocturia more than twice per night 05/07/2017   Diabetes due to underlying condition w oth circulatory comp ([place]) 05/07/2017   PVD (peripheral vascular disease) ([place]) 05/07/2017   Poor  sleep hygiene 05/07/2017   Paradoxical insomnia 05/07/2017   Abnormal feces 04/10/2017   Hilar lymphadenopathy    Hemoptysis 02/19/2017   Pulmonary nodules/lesions, multiple 02/19/2017   Tobacco use 02/19/2017   Disorder of arteries and arterioles, unspecified ([place]) 01/31/2017   Lung field abnormal 01/31/2017   RUQ abdominal pain 01/13/2017   Restless legs syndrome 11/28/2016   Left-sided extracranial carotid artery occlusion 11/26/2016   PAOD (peripheral arterial occlusive disease) ([place]) 11/26/2016   Encounter for routine follow-up after surgery of the circulatory system 11/26/2016   Abnormal weight loss 01/29/2016   Allergic rhinitis 01/29/2016   Chest pain with moderate risk of acute coronary syndrome 10/27/2015   Other slipping, tripping and stumbling without falling, initial encounter 07/28/2015   Acid indigestion 12/31/2013   Pain in left hip 08/26/2013   Screening for gout 05/13/2013   Disorder of skin or subcutaneous tissue 12/22/2012   Difficult or painful urination 09/02/2012   Peripheral blood vessel disorder ([place]) 05/11/2012   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   Follow-up examination, following unspecified surgery 05/11/2012   Occlusion and stenosis of carotid artery without mention of cerebral infarction 04/14/2012   Fatigue 09/24/2011   Gonalgia 07/04/2011   Avitaminosis D 05/02/2011   Chronic pancreatitis ([place]) 04/30/2011   Vitamin B12 deficiency 04/30/2011  Intestinal motility disorder 04/30/2011   Fatty liver 04/30/2011   Adiposity 04/30/2011   Other and unspecified general psychiatric examination 04/26/2011   Abdominal pain 04/09/2011   Iron deficiency anemia, unspecified  04/09/2011   Other general symptoms  04/09/2011   Asthma 04/09/2011   Type 2 diabetes mellitus with diabetic peripheral angiopathy without gangrene ([place]) 01/24/2011   Abnormal weight gain 11/30/2010   Dyslipidemia    Dizziness    Right-sided extracranial carotid  artery stenosis    Normal nuclear stress test    Feeling bilious 07/11/2010   Underimmunization status 07/11/2010   Pancreas (digestive gland) works poorly 05/30/2010   B12 deficiency 03/19/2010   DVT 03/16/2010   BLIND LOOP SYNDROME 03/16/2010   Diarrhea 03/16/2010   Chronic depression 02/23/2010   Major depression, single episode 02/23/2010   Cough 11/17/2009   Plantar verruca 11/17/2009   Arteriosclerosis of coronary artery 11/16/2009   Claudication ([place]) 11/16/2009   Compulsive tobacco user syndrome 11/16/2009   HLD (hyperlipidemia) 11/16/2009   Nontoxic single thyroid nodule 11/16/2009   Obstructive apnea 11/16/2009   Radial styloid tenosynovitis 11/16/2009   LBP (low back pain) 11/16/2009   Gastroparesis 11/16/2009   LAXATIVE ABUSE 12/17/2007   Accelerated hypertension 12/17/2007   Acid reflux 12/17/2007   HIATAL HERNIA 12/17/2007   IBS 12/17/2007   MELANOSIS COLI 12/17/2007   ARTHRITIS 12/17/2007   HEPATOMEGALY 12/17/2007   PCP:  Donnajean Lopes, MD Pharmacy:   [id number] [address]Phone: [phone]: [phone] [facility] [place] [place]Phone: [phone]: [address]Phone: [phone]: [phone]    Social Determinants of Health (SDOH) Interventions    Readmission Risk Interventions     No data to display

## 2022-04-17 NOTE — Progress Notes (Signed)
Occupational Therapy Treatment Patient Details Name: [name] MRN: [id number] DOB: 10/03/1946 Today's Date: 04/17/2022   History of present illness Pt is a 76 y.o. female admitted 04/15/22 with RUE numbness, visual blurriness, aphasia, frontal headache. MRI revealed R acute occipital infarct. Of note, undergoing workup for transcarotid artery revascularization. PMH includes CVA, OSA on CPAP, CAD, DM, HTN, PVD, chronic pancreatitis, ischemic bowel s/p colectomy with subsequent reversal.   OT comments  Pt currently modified independent and at baseline for transfers and selfcare without use of an assistive device.  She needs increased time secondary to left knee pain and recommend occasional use of a cane or walker at home on days it is bothering her more.  No further acute OT needs at this time.  MD please re-order OT if needed after procedure tomorrow if pt exhibits changes in ADL function.   Recommendations for follow up therapy are one component of a multi-disciplinary discharge planning process, led by the attending physician.  Recommendations may be updated based on patient status, additional functional criteria and insurance authorization.    Follow Up Recommendations  No OT follow up    Assistance Recommended at Discharge None  Patient can return home with the following  Assist for transportation   Equipment Recommendations  Other (comment) (family will purchase tub bench)       Precautions / Restrictions Precautions Precautions: Fall Restrictions Weight Bearing Restrictions: No       Mobility Bed Mobility Overal bed mobility: Independent                  Transfers Overall transfer level: Modified independent Equipment used: None Transfers: Sit to/from Stand Sit to Stand: Modified independent (Device/Increase time)     Step pivot transfers: Modified independent (Device/Increase time)     General transfer comment: Pt able to ambulate at a slower rate of  speed than normal without an assistive device to the therapy gym and back.     Balance Overall balance assessment: Needs assistance Sitting-balance support: Feet supported, Feet unsupported Sitting balance-Leahy Scale: Normal     Standing balance support: No upper extremity supported, During functional activity Standing balance-Leahy Scale: Good                             ADL either performed or assessed with clinical judgement   ADL Overall ADL's : At baseline;Modified independent                     Lower Body Dressing: Modified independent;Sit to/from stand   Toilet Transfer: Modified Independent;Regular Toilet;Grab bars;Ambulation Toilet Transfer Details (indicate cue type and reason): simulated Toileting- Clothing Manipulation and Hygiene: Modified independent;Sit to/from stand Toileting - Clothing Manipulation Details (indicate cue type and reason): simulated Tub/ Shower Transfer: Tub transfer;Min guard;Ambulation;Shower seat   Functional mobility during ADLs: Modified independent (slower rate of speed without an assistive device) General ADL Comments: BP in sitting at 174/78.  She was baseline for dressing tasks as well as transfers.  She does have history recently of left knee pain which makes her walk slower and at times with a slight limp.  Encouarged use of the cane or RW at home when she gets up and is having more pain.  Also recommend tub bench for home to make it easier to transfer into and out of the tub.  She currently has a small shower seat and reports some difficulty stepping into and out of   the tub.  Verbally reveiwed use of the tub bench as well as techniques for drying off while sitting, and then drying off her feet with a towel when scooting out to the edge before standing to get dressing.  Handout also provided with picture of a tub bench so her family can order.               Cognition Arousal/Alertness: Awake/alert Behavior During  Therapy: WFL for tasks assessed/performed Overall Cognitive Status: Within Functional Limits for tasks assessed                                                     Pertinent Vitals/ Pain       Pain Assessment Pain Assessment: No/denies pain  Home Living     Available Help at Discharge: Family;Friend(s);Available PRN/intermittently Type of Home: House                              Lives With: Alone           Progress Toward Goals  OT Goals(current goals can now be found in the care plan section)  Progress towards OT goals: Goals met/education completed, patient discharged from [place] All goals met and education completed, patient discharged from [facility] OT "6 Clicks" Daily Activity     Outcome Measure   Help from another person eating meals?: None Help from another person taking care of personal grooming?: None Help from another person toileting, which includes using toliet, bedpan, or urinal?: None Help from another person bathing (including washing, rinsing, drying)?: None Help from another person to put on and taking off regular upper body clothing?: None Help from another person to put on and taking off regular lower body clothing?: None 6 Click Score: 24       Activity Tolerance Patient tolerated treatment well   Patient Left in bed;with call bell/phone within reach   Nurse Communication Mobility status        Time: 1041-1106 OT Time Calculation (min): 25 min  Charges: OT General Charges $OT Visit: 1 Visit OT Treatments $Self Care/Home Management : 23-37 mins   Georgette Helmer OTR/L 04/17/2022, 3:10 PM

## 2022-04-17 NOTE — Progress Notes (Addendum)
  Daily Progress Note   Subjective: Feeling better this morning, arm and leg symptoms resolved  Objective: Vitals:   04/16/22 2345 04/17/22 0359  BP: (!) 210/90 (!) 191/77  Pulse: 95 93  Resp: 18 17  Temp: 97.9 F (36.6 C) 97.7 F (36.5 C)  SpO2: 96% 95%    Physical Examination Regular rate Normal. Strength Known slurred speech - this is baseline  MRI findings:  Brain: Punctate focus of reduced diffusion is present in the right occipital lobe near the occipital horn. No evidence hemorrhage. Ventricles and sulci are stable in size and configuration. Patchy and confluent areas of T2 hyperintensity in the supratentorial and pontine white matter are nonspecific but probably reflects stable chronic microvascular ischemic changes. There is no intracranial mass or mass effect. No hydrocephalus or extra-axial collection.  ASSESSMENT/PLAN:   Plan for transcarotid artery revascularization tomorrow Please make n.p.o. I had a long discussion with [name] regarding her symptomatic right-sided carotid artery stenosis, after discussing the risk and benefits of cerebral revascularization, [name] elected to proceed. She is aware she is at high risk due to comorbidities as well as contralateral occlusion.   Joanna Santee MD MS [facility] [phone] 04/17/2022  8:31 AM

## 2022-04-18 ENCOUNTER — Inpatient Hospital Stay (HOSPITAL_COMMUNITY): Payer: Medicare HMO | Admitting: Anesthesiology

## 2022-04-18 ENCOUNTER — Inpatient Hospital Stay (HOSPITAL_COMMUNITY): Payer: Medicare HMO

## 2022-04-18 ENCOUNTER — Encounter (HOSPITAL_COMMUNITY)
Admission: EM | Disposition: A | Discharge status: Home Health | Payer: Self-pay | Source: Home / Self Care | Attending: Internal Medicine

## 2022-04-18 ENCOUNTER — Other Ambulatory Visit: Payer: Self-pay

## 2022-04-18 ENCOUNTER — Encounter (HOSPITAL_COMMUNITY): Payer: Self-pay | Admitting: Internal Medicine

## 2022-04-18 DIAGNOSIS — I63231 Cerebral infarction due to unspecified occlusion or stenosis of right carotid arteries: Secondary | ICD-10-CM

## 2022-04-18 DIAGNOSIS — E782 Mixed hyperlipidemia: Secondary | ICD-10-CM

## 2022-04-18 DIAGNOSIS — G4733 Obstructive sleep apnea (adult) (pediatric): Secondary | ICD-10-CM

## 2022-04-18 DIAGNOSIS — N183 Chronic kidney disease, stage 3 unspecified: Secondary | ICD-10-CM

## 2022-04-18 DIAGNOSIS — I10 Essential (primary) hypertension: Secondary | ICD-10-CM

## 2022-04-18 DIAGNOSIS — Z87891 Personal history of nicotine dependence: Secondary | ICD-10-CM

## 2022-04-18 DIAGNOSIS — J449 Chronic obstructive pulmonary disease, unspecified: Secondary | ICD-10-CM

## 2022-04-18 DIAGNOSIS — I639 Cerebral infarction, unspecified: Secondary | ICD-10-CM | POA: Diagnosis not present

## 2022-04-18 DIAGNOSIS — G4730 Sleep apnea, unspecified: Secondary | ICD-10-CM

## 2022-04-18 DIAGNOSIS — Z8719 Personal history of other diseases of the digestive system: Secondary | ICD-10-CM

## 2022-04-18 DIAGNOSIS — I739 Peripheral vascular disease, unspecified: Secondary | ICD-10-CM

## 2022-04-18 DIAGNOSIS — G9341 Metabolic encephalopathy: Secondary | ICD-10-CM

## 2022-04-18 DIAGNOSIS — E0859 Diabetes mellitus due to underlying condition with other circulatory complications: Secondary | ICD-10-CM

## 2022-04-18 DIAGNOSIS — I6521 Occlusion and stenosis of right carotid artery: Secondary | ICD-10-CM

## 2022-04-18 HISTORY — PX: TRANSCAROTID ARTERY REVASCULARIZATIONÂ: SHX6778

## 2022-04-18 LAB — SURGICAL PCR SCREEN
MRSA, PCR: NEGATIVE
Staphylococcus aureus: NEGATIVE

## 2022-04-18 LAB — TYPE AND SCREEN
ABO/RH(D): A POS
Antibody Screen: NEGATIVE

## 2022-04-18 LAB — GLUCOSE, CAPILLARY
Glucose-Capillary: 95 mg/dL (ref 70–99)
Glucose-Capillary: 108 mg/dL — ABNORMAL HIGH (ref 70–99)
Glucose-Capillary: 123 mg/dL — ABNORMAL HIGH (ref 70–99)
Glucose-Capillary: 143 mg/dL — ABNORMAL HIGH (ref 70–99)
Glucose-Capillary: 154 mg/dL — ABNORMAL HIGH (ref 70–99)

## 2022-04-18 LAB — POCT ACTIVATED CLOTTING TIME: Activated Clotting Time: 275 seconds

## 2022-04-18 SURGERY — TRANSCAROTID ARTERY REVASCULARIZATION (TCAR)
Anesthesia: General | Site: Neck | Laterality: Right

## 2022-04-18 MED ORDER — MAGNESIUM SULFATE 2 GM/50ML IV SOLN: 2.0000 g | Freq: Every day | INTRAVENOUS | Status: DC | PRN

## 2022-04-18 MED ORDER — ROCURONIUM BROMIDE 10 MG/ML (PF) SYRINGE
PREFILLED_SYRINGE | INTRAVENOUS | Status: DC | PRN
  Administered 2022-04-18: 50 mg via INTRAVENOUS

## 2022-04-18 MED ORDER — HYDROMORPHONE HCL 1 MG/ML IJ SOLN: 0.5000 mg | INTRAMUSCULAR | Status: DC | PRN

## 2022-04-18 MED ORDER — DIPHENHYDRAMINE HCL 50 MG/ML IJ SOLN
INTRAMUSCULAR | Status: DC | PRN
  Administered 2022-04-18: 25 mg via INTRAVENOUS

## 2022-04-18 MED ORDER — STERILE WATER FOR IRRIGATION IR SOLN
Status: DC | PRN
  Administered 2022-04-18: 1000 mL

## 2022-04-18 MED ORDER — ONDANSETRON HCL 4 MG/2ML IJ SOLN
INTRAMUSCULAR | Status: AC
  Filled 2022-04-18: qty 6

## 2022-04-18 MED ORDER — PHENYLEPHRINE HCL-NACL 20-0.9 MG/250ML-% IV SOLN
INTRAVENOUS | Status: DC | PRN
  Administered 2022-04-18: 50 ug/min via INTRAVENOUS

## 2022-04-18 MED ORDER — SODIUM CHLORIDE 0.9 % IV SOLN
500.0000 mL | Freq: Once | INTRAVENOUS | Status: AC | PRN
  Administered 2022-04-18: 500 mL via INTRAVENOUS

## 2022-04-18 MED ORDER — POTASSIUM CHLORIDE CRYS ER 20 MEQ PO TBCR: 20.0000 meq | EXTENDED_RELEASE_TABLET | Freq: Every day | ORAL | Status: DC | PRN

## 2022-04-18 MED ORDER — PROTAMINE SULFATE 10 MG/ML IV SOLN
INTRAVENOUS | Status: AC
  Filled 2022-04-18: qty 5

## 2022-04-18 MED ORDER — LACTATED RINGERS IV SOLN: INTRAVENOUS | Status: DC | PRN

## 2022-04-18 MED ORDER — GUAIFENESIN-DM 100-10 MG/5ML PO SYRP: 15.0000 mL | ORAL_SOLUTION | ORAL | Status: DC | PRN

## 2022-04-18 MED ORDER — DEXAMETHASONE SODIUM PHOSPHATE 10 MG/ML IJ SOLN
INTRAMUSCULAR | Status: AC
  Filled 2022-04-18: qty 2

## 2022-04-18 MED ORDER — HEPARIN 6000 UNIT IRRIGATION SOLUTION
Status: DC | PRN
  Administered 2022-04-18: 1

## 2022-04-18 MED ORDER — FENTANYL CITRATE (PF) 100 MCG/2ML IJ SOLN: 25.0000 ug | INTRAMUSCULAR | Status: DC | PRN

## 2022-04-18 MED ORDER — EPHEDRINE SULFATE-NACL 50-0.9 MG/10ML-% IV SOSY
PREFILLED_SYRINGE | INTRAVENOUS | Status: DC | PRN
  Administered 2022-04-18: 5 mg via INTRAVENOUS
  Administered 2022-04-18: 10 mg via INTRAVENOUS

## 2022-04-18 MED ORDER — PROTAMINE SULFATE 10 MG/ML IV SOLN
INTRAVENOUS | Status: DC | PRN
  Administered 2022-04-18: 50 mg via INTRAVENOUS

## 2022-04-18 MED ORDER — ALUM & MAG HYDROXIDE-SIMETH 200-200-20 MG/5ML PO SUSP: 15.0000 mL | ORAL | Status: DC | PRN

## 2022-04-18 MED ORDER — ENOXAPARIN SODIUM 30 MG/0.3ML IJ SOSY: 30.0000 mg | PREFILLED_SYRINGE | INTRAMUSCULAR | Status: DC

## 2022-04-18 MED ORDER — HEPARIN SODIUM (PORCINE) 1000 UNIT/ML IJ SOLN
INTRAMUSCULAR | Status: AC
  Filled 2022-04-18: qty 20

## 2022-04-18 MED ORDER — FENTANYL CITRATE (PF) 250 MCG/5ML IJ SOLN
INTRAMUSCULAR | Status: AC
  Filled 2022-04-18: qty 5

## 2022-04-18 MED ORDER — PHENOL 1.4 % MT LIQD: 1.0000 | OROMUCOSAL | Status: DC | PRN

## 2022-04-18 MED ORDER — METOPROLOL TARTRATE 5 MG/5ML IV SOLN: 2.0000 mg | INTRAVENOUS | Status: DC | PRN

## 2022-04-18 MED ORDER — CLEVIDIPINE BUTYRATE 0.5 MG/ML IV EMUL
INTRAVENOUS | Status: DC | PRN
  Administered 2022-04-18: 2 mg/h via INTRAVENOUS

## 2022-04-18 MED ORDER — DEXAMETHASONE SODIUM PHOSPHATE 10 MG/ML IJ SOLN
INTRAMUSCULAR | Status: DC | PRN
  Administered 2022-04-18: 10 mg via INTRAVENOUS

## 2022-04-18 MED ORDER — ROCURONIUM BROMIDE 10 MG/ML (PF) SYRINGE
PREFILLED_SYRINGE | INTRAVENOUS | Status: AC
  Filled 2022-04-18: qty 20

## 2022-04-18 MED ORDER — GLYCOPYRROLATE PF 0.2 MG/ML IJ SOSY
PREFILLED_SYRINGE | INTRAMUSCULAR | Status: DC | PRN
  Administered 2022-04-18: .2 mg via INTRAVENOUS

## 2022-04-18 MED ORDER — ORAL CARE MOUTH RINSE: 15.0000 mL | Freq: Once | OROMUCOSAL | Status: AC

## 2022-04-18 MED ORDER — LIDOCAINE 2% (20 MG/ML) 5 ML SYRINGE
INTRAMUSCULAR | Status: DC | PRN
  Administered 2022-04-18: 60 mg via INTRAVENOUS

## 2022-04-18 MED ORDER — CEFAZOLIN SODIUM-DEXTROSE 2-3 GM-%(50ML) IV SOLR
INTRAVENOUS | Status: DC | PRN
  Administered 2022-04-18: 2 g via INTRAVENOUS

## 2022-04-18 MED ORDER — PROPOFOL 500 MG/50ML IV EMUL
INTRAVENOUS | Status: DC | PRN
  Administered 2022-04-18: 40 ug/kg/min via INTRAVENOUS

## 2022-04-18 MED ORDER — 0.9 % SODIUM CHLORIDE (POUR BTL) OPTIME
TOPICAL | Status: DC | PRN
  Administered 2022-04-18: 1000 mL

## 2022-04-18 MED ORDER — SUGAMMADEX SODIUM 200 MG/2ML IV SOLN
INTRAVENOUS | Status: DC | PRN
  Administered 2022-04-18 (×2): 200 mg via INTRAVENOUS

## 2022-04-18 MED ORDER — LABETALOL HCL 5 MG/ML IV SOLN: 10.0000 mg | INTRAVENOUS | Status: DC | PRN

## 2022-04-18 MED ORDER — CEFAZOLIN SODIUM-DEXTROSE 2-4 GM/100ML-% IV SOLN
2.0000 g | Freq: Three times a day (TID) | INTRAVENOUS | Status: AC
  Administered 2022-04-18 – 2022-04-19 (×2): 2 g via INTRAVENOUS
  Filled 2022-04-18 (×2): qty 100

## 2022-04-18 MED ORDER — GLYCOPYRROLATE PF 0.2 MG/ML IJ SOSY
PREFILLED_SYRINGE | INTRAMUSCULAR | Status: AC
  Filled 2022-04-18: qty 4

## 2022-04-18 MED ORDER — HEPARIN SODIUM (PORCINE) 1000 UNIT/ML IJ SOLN
INTRAMUSCULAR | Status: DC | PRN
  Administered 2022-04-18: 9000 [IU] via INTRAVENOUS

## 2022-04-18 MED ORDER — PROPOFOL 10 MG/ML IV BOLUS
INTRAVENOUS | Status: DC | PRN
  Administered 2022-04-18: 30 mg via INTRAVENOUS
  Administered 2022-04-18 (×2): 20 mg via INTRAVENOUS
  Administered 2022-04-18: 150 mg via INTRAVENOUS
  Administered 2022-04-18: 10 mg via INTRAVENOUS

## 2022-04-18 MED ORDER — LACTATED RINGERS IV SOLN: INTRAVENOUS | Status: DC

## 2022-04-18 MED ORDER — DOCUSATE SODIUM 100 MG PO CAPS
100.0000 mg | ORAL_CAPSULE | Freq: Every day | ORAL | Status: DC
Start: 2022-04-19 — End: 2022-04-19
  Filled 2022-04-18: qty 1

## 2022-04-18 MED ORDER — IODIXANOL 320 MG/ML IV SOLN
INTRAVENOUS | Status: DC | PRN
  Administered 2022-04-18: 30 mL via INTRA_ARTERIAL

## 2022-04-18 MED ORDER — ONDANSETRON HCL 4 MG/2ML IJ SOLN
INTRAMUSCULAR | Status: DC | PRN
  Administered 2022-04-18: 4 mg via INTRAVENOUS

## 2022-04-18 MED ORDER — CEFAZOLIN SODIUM 1 G IJ SOLR
INTRAMUSCULAR | Status: AC
Start: 2022-04-18 — End: ?
  Filled 2022-04-18: qty 20

## 2022-04-18 MED ORDER — EPHEDRINE 5 MG/ML INJ
INTRAVENOUS | Status: AC
  Filled 2022-04-18: qty 10

## 2022-04-18 MED ORDER — HYDRALAZINE HCL 20 MG/ML IJ SOLN: 5.0000 mg | INTRAMUSCULAR | Status: DC | PRN

## 2022-04-18 MED ORDER — CHLORHEXIDINE GLUCONATE 0.12 % MT SOLN
15.0000 mL | Freq: Once | OROMUCOSAL | Status: AC
  Administered 2022-04-18: 15 mL via OROMUCOSAL
  Filled 2022-04-18: qty 15

## 2022-04-18 MED ORDER — LIDOCAINE 2% (20 MG/ML) 5 ML SYRINGE
INTRAMUSCULAR | Status: AC
  Filled 2022-04-18: qty 10

## 2022-04-18 MED ORDER — FENTANYL CITRATE (PF) 250 MCG/5ML IJ SOLN
INTRAMUSCULAR | Status: DC | PRN
  Administered 2022-04-18: 100 ug via INTRAVENOUS
  Administered 2022-04-18 (×3): 50 ug via INTRAVENOUS

## 2022-04-18 MED ORDER — HEPARIN 6000 UNIT IRRIGATION SOLUTION
Status: AC
Start: 2022-04-18 — End: ?
  Filled 2022-04-18: qty 500

## 2022-04-18 MED ORDER — HEMOSTATIC AGENTS (NO CHARGE) OPTIME
TOPICAL | Status: DC | PRN
  Administered 2022-04-18: 1 via TOPICAL

## 2022-04-18 SURGICAL SUPPLY — 58 items
ADH SKN CLS APL DERMABOND .7 (GAUZE/BANDAGES/DRESSINGS) ×2
BAG BANDED W/RUBBER/TAPE 36X54 (MISCELLANEOUS) ×2 IMPLANT
BAG COUNTER SPONGE SURGICOUNT (BAG) ×2 IMPLANT
BAG EQP BAND 135X91 W/RBR TAPE (MISCELLANEOUS) ×1
BAG SPNG CNTER NS LX DISP (BAG) ×1
BALLN STERLING RX 6X30X80 (BALLOONS) ×2
BALLOON STERLING RX 6X30X80 (BALLOONS) IMPLANT
CANISTER SUCT 3000ML PPV (MISCELLANEOUS) ×2 IMPLANT
CATH ROBINSON RED A/P 18FR (CATHETERS) IMPLANT
CLIP LIGATING EXTRA MED SLVR (CLIP) ×2 IMPLANT
CLIP LIGATING EXTRA SM BLUE (MISCELLANEOUS) ×2 IMPLANT
COVER DOME SNAP 22 D (MISCELLANEOUS) ×2 IMPLANT
COVER PROBE W GEL 5X96 (DRAPES) ×3 IMPLANT
DERMABOND ADVANCED (GAUZE/BANDAGES/DRESSINGS) ×2
DERMABOND ADVANCED .7 DNX12 (GAUZE/BANDAGES/DRESSINGS) ×1 IMPLANT
DEVICE INFLATION ENCORE 26 (MISCELLANEOUS) ×1 IMPLANT
DRAPE FEMORAL ANGIO 80X135IN (DRAPES) ×2 IMPLANT
ELECT REM PT RETURN 9FT ADLT (ELECTROSURGICAL) ×2
ELECTRODE REM PT RTRN 9FT ADLT (ELECTROSURGICAL) ×1 IMPLANT
GLOVE BIO SURGEON STRL SZ7.5 (GLOVE) ×2 IMPLANT
GLOVE SRG 8 PF TXTR STRL LF DI (GLOVE) ×1 IMPLANT
GLOVE SURG POLYISO LF SZ8 (GLOVE) IMPLANT
GLOVE SURG UNDER POLY LF SZ8 (GLOVE) ×2
GOWN STRL REUS W/ TWL LRG LVL3 (GOWN DISPOSABLE) ×2 IMPLANT
GOWN STRL REUS W/TWL 2XL LVL3 (GOWN DISPOSABLE) ×2 IMPLANT
GOWN STRL REUS W/TWL LRG LVL3 (GOWN DISPOSABLE) ×4
GUIDEWIRE ENROUTE 0.014 (WIRE) ×2 IMPLANT
HEMOSTAT SNOW SURGICEL 2X4 (HEMOSTASIS) ×1 IMPLANT
INTRODUCER KIT GALT 7CM (INTRODUCER) ×2
KIT BASIN OR (CUSTOM PROCEDURE TRAY) ×2 IMPLANT
KIT ENCORE 26 ADVANTAGE (KITS) ×2 IMPLANT
KIT INTRODUCER GALT 7 (INTRODUCER) ×1 IMPLANT
KIT MICROPUNCTURE NIT STIFF (SHEATH) ×1 IMPLANT
KIT TURNOVER KIT B (KITS) ×2 IMPLANT
NDL HYPO 25GX1X1/2 BEV (NEEDLE) IMPLANT
NEEDLE HYPO 25GX1X1/2 BEV (NEEDLE) IMPLANT
PACK CAROTID (CUSTOM PROCEDURE TRAY) ×2 IMPLANT
POSITIONER HEAD DONUT 9IN (MISCELLANEOUS) ×2 IMPLANT
PROTECTION STATION PRESSURIZED (MISCELLANEOUS) ×2
SET MICROPUNCTURE 5F STIFF (MISCELLANEOUS) ×2 IMPLANT
STATION PROTECTION PRESSURIZED (MISCELLANEOUS) ×1 IMPLANT
STENT TRANSCAROTID SYSTEM 9X40 (Permanent Stent) ×1 IMPLANT
SUT MNCRL AB 4-0 PS2 18 (SUTURE) ×2 IMPLANT
SUT PROLENE 5 0 C 1 24 (SUTURE) ×2 IMPLANT
SUT SILK 2 0 PERMA HAND 18 BK (SUTURE) IMPLANT
SUT SILK 2 0 SH (SUTURE) ×2 IMPLANT
SUT SILK 3 0 (SUTURE)
SUT SILK 3-0 18XBRD TIE 12 (SUTURE) IMPLANT
SUT VIC AB 3-0 SH 27 (SUTURE) ×2
SUT VIC AB 3-0 SH 27X BRD (SUTURE) ×1 IMPLANT
SYR 10ML LL (SYRINGE) ×6 IMPLANT
SYR 20ML LL LF (SYRINGE) ×2 IMPLANT
SYR CONTROL 10ML LL (SYRINGE) IMPLANT
SYSTEM TRANSCAROTID NEUROPRTCT (MISCELLANEOUS) ×1 IMPLANT
TOWEL GREEN STERILE (TOWEL DISPOSABLE) ×2 IMPLANT
TRANSCAROTID NEUROPROTECT SYS (MISCELLANEOUS) ×2
WATER STERILE IRR 1000ML POUR (IV SOLUTION) ×2 IMPLANT
WIRE BENTSON .035X145CM (WIRE) ×2 IMPLANT

## 2022-04-18 NOTE — Anesthesia Procedure Notes (Signed)
Procedure Name: Intubation Date/Time: 04/18/2022 10:45 AM  Performed by: Minerva Ends, CRNAPre-anesthesia Checklist: Patient identified, Emergency Drugs available, Suction available and Patient being monitored Patient Re-evaluated:Patient Re-evaluated prior to induction Oxygen Delivery Method: Circle system utilized Preoxygenation: Pre-oxygenation with 100% oxygen Induction Type: IV induction Ventilation: Mask ventilation without difficulty Laryngoscope Size: Mac and 3 Grade View: Grade I Tube type: Oral Tube size: 7.0 mm Number of attempts: 2 Airway Equipment and Method: Stylet, Oral airway and Bougie stylet Placement Confirmation: ETT inserted through vocal cords under direct vision, positive ETCO2 and breath sounds checked- equal and bilateral Secured at: 22 cm Tube secured with: Tape Dental Injury: Teeth and Oropharynx as per pre-operative assessment

## 2022-04-18 NOTE — Progress Notes (Signed)
Progress Note  Patient: [name] XTK:[id number] DOB: 06/11/1946  DOA: 04/15/2022  DOS: 04/18/2022    Brief hospital course: ANTRICE PAL is a 76 y.o. female with medical history significant of CAD; DM; HLD; HTN; PVD; chronic pancreatitis; ischemic bowel with colectomy/colostomy with subsequent reversal; seizure; OSA not on CPAP; with no known carotid stenosis, planned outpatient follow-up with vascular surgery given known carotid stenosis and history of CVA.   Patient presents with strokelike symptoms having visited her PCP with right arm numbness slurred speech and given her known history and risk factors she was sent to the ED for further evaluation and treatment.  Neurology consulted at intake, imaging initially unremarkable however CTA and transcranial Doppler shows a string sign concerning for imminent vascular compromise prompting vascular consultation and subsequent right TCAR 7/6 by Dr. Virl Cagey.   Assessment and Plan: Acute punctate CVA Critical right ICA bulb stenosis -Patient with transient numbness of R forearm, R cheek, and aphasia - all of these symptoms have resolved -MRI shows punctate occipital CVA that would not explain her current symptoms -CTA head/neck without overt large vessel occlusion, chronically occluded left ICA with severe stenosis of the right ICA bulb approaching "string sign" >> now s/p TCAR 7/6 by Dr. Virl Cagey.  -Echo done recently so will not repeat at this time -No follow up per PT or OT, will order HH-SLP at discharge.  - DAPT PTA and neurology recommends continuing aspirin and plavix at discharge. Continue statin.    PVD/CAD Chronic left ICA occlusion -remote h/o L SFA stenting -Followed with Dr. Virl Cagey on 6/30  - Given severity of cerebrovascular disease/bilateral atherosclerosis, would aim to avoid hypotension, keep SBP >193mHg at least, with ideal SBP 120-130's. D/w Dr. SLeonie Man  Encephalopathy, transient, resolved -Neurology following, EEG  negative -Questionably secondary to hypotension versus low blood flow given critical carotid stenosis as above -Resolved - continue to follow clinically   HTN: Poorly controlled at baseline, recent admission for hypertensive emergency -Aim for normotension, avoiding severely elevated or low BP. -Slowly improve blood pressure over the next 48 to 72 hours -Given low-dose amlodipine, continue prn hydralazine >180/100 -Hold home hydralazine, losartan, metoprolol and Maxzide    HLD: LDL is 28 -Continue Lipitor increased dose 40 mg daily   DM, well controlled, diet controlled. -A1C 6.2 -Not on home meds -Continue SSI to maintain euglycemia   Stage 3b CKD -At baseline -Will follow, attempt to avoid nephrotoxic agents    Chronic pancreatitis -Hold cholestyramine, as patient failed swallow evaluation -Resume Creon with meals, when able   Seizure d/o: EEG showed: This study is suggestive of cortical dysfunction arising from left temporo-parietal region, nonspecific etiology but could be secondary to underlying structural abnormality, post-ictal state. No seizures or epileptiform discharges were seen throughout the recording. -Continue Keppra   OSA -Not on CPAP   Chronic pain -Continue home meds (Norco)   Mood d/o -Continue Remeron, Effexor   Asthma -Continue Singulair, albuterol -Well controlled  Obesity: Estimated body mass index is 32.34 kg/m as calculated from the following:   Height as of this encounter: '5\' 3"'$  (1.6 m).   Weight as of this encounter: 82.8 kg.  Subjective: Seen after procedure, feels she has a right sided headache without photophobia or pain with EOM. No new weakness, numbness, or speech changes. Blood pressures soft but improving and patient mentating clearly. Got neo downstairs for a few hours.  Objective: Vitals:   04/18/22 1500 04/18/22 1515 04/18/22 1530 04/18/22 1600  BP: (!) 111/53 (Marland Kitchen  101/50 99/66 (!) 96/56  Pulse: 71 71 69 88  Resp: '14 14 20 20   '$ Temp:      TempSrc:      SpO2: 97% 98% 99% 98%  Weight:      Height:       Gen: 76 y.o. female in no distress Pulm: Nonlabored without crackles or wheezes.  CV: Regular rate and rhythm. No murmur, rub, or gallop. No definite JVD, no pitting dependent edema. GI: Abdomen soft, non-tender, non-distended, with normoactive bowel sounds.  Ext: Warm, no deformities Skin: Right TCAR longitudinal incision c/d/I with dermabond, no discharge, bleeding. No other rashes, lesions or ulcers on visualized skin. Neuro: Alert and oriented. No focal neurological deficits. Psych: Judgement and insight appear fair. Mood euthymic & affect congruent. Behavior is appropriate.    Data Personally reviewed: CBC: Recent Labs  Lab 04/15/22 0943 04/15/22 0946  WBC 18.3*  --   NEUTROABS 13.9*  --   HGB 11.4* 12.2  HCT 36.7 36.0  MCV 95.8  --   PLT 224  --    Basic Metabolic Panel: Recent Labs  Lab 04/15/22 0943 04/15/22 0946  NA 135 135  K 3.8 3.9  CL 107 104  CO2 19*  --   GLUCOSE 121* 114*  BUN 17 19  CREATININE 1.68* 1.80*  CALCIUM 8.6*  --    GFR: Estimated Creatinine Clearance: 28 mL/min (A) (by C-G formula based on SCr of 1.8 mg/dL (H)). Liver Function Tests: Recent Labs  Lab 04/15/22 0943  AST 29  ALT 21  ALKPHOS 100  BILITOT 0.8  PROT 6.3*  ALBUMIN 3.2*   No results for input(s): "LIPASE", "AMYLASE" in the last 168 hours. No results for input(s): "AMMONIA" in the last 168 hours. Coagulation Profile: Recent Labs  Lab 04/15/22 0943  INR 1.2   Cardiac Enzymes: No results for input(s): "CKTOTAL", "CKMB", "CKMBINDEX", "TROPONINI" in the last 168 hours. BNP (last 3 results) No results for input(s): "PROBNP" in the last 8760 hours. HbA1C: Recent Labs    04/16/22 0818  HGBA1C 6.2*   CBG: Recent Labs  Lab 04/17/22 1618 04/18/22 0645 04/18/22 0811 04/18/22 1245 04/18/22 1620  GLUCAP 105* 95 108* 123* 143*   Lipid Profile: Recent Labs    04/16/22 0331  CHOL 108   HDL 62  LDLCALC 28  TRIG 90  CHOLHDL 1.7   Thyroid Function Tests: No results for input(s): "TSH", "T4TOTAL", "FREET4", "T3FREE", "THYROIDAB" in the last 72 hours. Anemia Panel: No results for input(s): "VITAMINB12", "FOLATE", "FERRITIN", "TIBC", "IRON", "RETICCTPCT" in the last 72 hours. Urine analysis:    Component Value Date/Time   COLORURINE STRAW (A) 04/15/2022 1218   APPEARANCEUR CLEAR 04/15/2022 1218   LABSPEC 1.003 (L) 04/15/2022 1218   PHURINE 5.0 04/15/2022 [place] 04/15/2022 1218   HGBUR NEGATIVE 04/15/2022 [place] 04/15/2022 [place] 04/15/2022 1218   PROTEINUR NEGATIVE 04/15/2022 1218   NITRITE NEGATIVE 04/15/2022 1218   LEUKOCYTESUR SMALL (A) 04/15/2022 1218   Recent Results (from the past 240 hour(s))  Urine Culture     Status: Abnormal   Collection Time: 04/08/22 10:59 PM   Specimen: Urine, Clean Catch  Result Value Ref Range Status   Specimen Description URINE, CLEAN CATCH  Final   Special Requests   Final    NONE Performed at [facility], [place] [address] [place], [place] [address]    Culture >=100,000 COLONIES/mL ESCHERICHIA COLI (A)  Final  Report Status 04/10/2022 FINAL  Final   Organism ID, Bacteria ESCHERICHIA COLI (A)  Final      Susceptibility   Escherichia coli - MIC*    AMPICILLIN <=2 SENSITIVE Sensitive     CEFAZOLIN <=4 SENSITIVE Sensitive     CEFEPIME <=0.12 SENSITIVE Sensitive     CEFTRIAXONE <=0.25 SENSITIVE Sensitive     CIPROFLOXACIN <=0.25 SENSITIVE Sensitive     GENTAMICIN <=1 SENSITIVE Sensitive     IMIPENEM <=0.25 SENSITIVE Sensitive     NITROFURANTOIN <=16 SENSITIVE Sensitive     TRIMETH/SULFA <=20 SENSITIVE Sensitive     AMPICILLIN/SULBACTAM <=2 SENSITIVE Sensitive     PIP/TAZO <=4 SENSITIVE Sensitive     * >=100,000 COLONIES/mL ESCHERICHIA COLI  Surgical pcr screen     Status: None   Collection Time: 04/18/22  9:21 AM   Specimen: Nasal Mucosa; Nasal Swab   Result Value Ref Range Status   MRSA, PCR NEGATIVE NEGATIVE Final   Staphylococcus aureus NEGATIVE NEGATIVE Final    Comment: (NOTE) The Xpert SA Assay (FDA approved for NASAL specimens in patients 85 years of age and older), is one component of a comprehensive surveillance program. It is not intended to diagnose infection nor to guide or monitor treatment. Performed at [facility], [place] [address], [place], [place] [address]      Family Communication: None at bedside  Disposition: Status is: Inpatient Remains inpatient appropriate because: Postoperative management, hopeful for DC home 7/7 Planned Discharge Destination: Home   Patrecia Pour, MD 04/18/2022 4:39 PM Page by [email or web address]

## 2022-04-18 NOTE — Progress Notes (Signed)
STROKE TEAM PROGRESS NOTE   INTERVAL HISTORY Patient is seen in her room with no family at the bedside.  Patient had elective right transcarotid artery revascularization today by Dr. Unk Lightning and procedure went well.  She has no new deficits and no complaints.  Vital signs stable.  Neurological exam unchanged CBC:  Recent Labs  Lab 04/15/22 0943 04/15/22 0946  WBC 18.3*  --   NEUTROABS 13.9*  --   HGB 11.4* 12.2  HCT 36.7 36.0  MCV 95.8  --   PLT 224  --    Basic Metabolic Panel:  Recent Labs  Lab 04/15/22 0943 04/15/22 0946  NA 135 135  K 3.8 3.9  CL 107 104  CO2 19*  --   GLUCOSE 121* 114*  BUN 17 19  CREATININE 1.68* 1.80*  CALCIUM 8.6*  --    Lipid Panel:  Recent Labs  Lab 04/16/22 0331  CHOL 108  TRIG 90  HDL 62  CHOLHDL 1.7  VLDL 18  LDLCALC 28   HgbA1c:  Recent Labs  Lab 04/16/22 0818  HGBA1C 6.2*   Urine Drug Screen:  Recent Labs  Lab 04/15/22 1218  LABOPIA POSITIVE*  COCAINSCRNUR NONE DETECTED  LABBENZ NONE DETECTED  AMPHETMU NONE DETECTED  THCU NONE DETECTED  LABBARB NONE DETECTED    Alcohol Level  Recent Labs  Lab 04/15/22 0943  ETH <10    IMAGING past 24 hours Structural Heart Procedure  Result Date: 04/18/2022 See surgical note for result.  HYBRID OR IMAGING ([facility] ONLY)  Result Date: 04/18/2022 There is no interpretation for this exam.  This order is for images obtained during a surgical procedure.  Please See "Surgeries" Tab for more information regarding the procedure.    PHYSICAL EXAM General:  Alert, well-nourished, well-developed patient in no acute distress Respiratory:  Regular, unlabored respirations on room air  NEURO:  Mental Status: AA&Ox3  Speech/Language: speech is slightly hypophonic but without dysarthria or aphasia.  Repetition, fluency, and comprehension intact.  Cranial Nerves:  II: PERRL. Visual fields full.  III, IV, VI: EOMI. Eyelids elevate symmetrically.  V: Sensation is intact to light touch and  symmetrical to face.  VII: Smile is symmetrical.   VIII: hearing intact to voice. IX, X: Phonation is normal.  MW:NUUVOZDG shrug 5/5. XII: tongue is midline without fasciculations. Motor: 5/5 strength to all muscle groups tested.  Tone: is normal and bulk is normal Sensation- Intact to light touch bilaterally.  Coordination: FTN intact bilaterally.No drift.  Gait- deferred   ASSESSMENT/PLAN [name] is a 76 y.o. female with history of chronic L ICA occlusion, R ICA stenosis (seeing vascular surgery as an outpatient for intervention), seizures, stroke, DM, asthma, COPD, PAD, CKD stage 4 and HTN presenting with sudden onset aphasia, right arm and leg numbness, dizziness, wavy lines in her vision and a headache.  Symptoms have fully resolved.  She has a history of migraines and has had vision changes with headaches before.  MRI show punctate right occipital stroke which does not explain her symptoms.  Silent punctate right occipital infarct along with left sided TIA vs. Complicated migraine..  Patient with chronic left carotid occlusion with now worsening right carotid preocclusive stenosis now status post right carotid TCAR 04/18/22 Code Stroke CT head No acute abnormality. ASPECTS 10.    CTA head & neck chronically occluded L ICA, severe right ICA stenosis approaching radiographic string sign, right vertebral artery stenosis  MRI  punctate right occipital infarct TCD pending 2D Echo  EF 45-50%, mild LVH, apical hypokinesis, interatrial septum not well visualized LDL 28 HgbA1c 6.2 VTE prophylaxis - lovenox    Diet   Diet regular Room service appropriate? Yes; Fluid consistency: Thin   aspirin 81 mg daily prior to admission, now on aspirin 81 mg daily and clopidogrel 75 mg daily for three weeks, then clopidogrel alone Therapy recommendations:  pending Disposition:  pending, likely home  Hypertension Home meds:  amlodipine 10 mg daily, metoprolol XL 100 mg daily, losartan 50 mg  daily Stable Permissive hypertension (OK if < 220/120) but gradually normalize in 5-7 days Long-term BP goal normotensive  Hyperlipidemia Home meds:  atorvastatin 20 mg daily, resumed in hospital LDL 28, goal < 70 High intensity statin not indicated as LDL below goal Continue statin at discharge  Diabetes type II Controlled Home meds:  none HgbA1c 6.2, goal < 7.0 CBGs Recent Labs    04/18/22 0645 04/18/22 0811 04/18/22 1245  GLUCAP 95 108* 123*    SSI  Seizure disorder EEG to rule out seizure activity Continue home Keppra  Other Stroke Risk Factors Advanced Age >/= 67  Former cigarette smoker Obesity, Body mass index is 32.34 kg/m., BMI >/= 30 associated with increased stroke risk, recommend weight loss, diet and exercise as appropriate  Hx stroke  Other Active Problems none  Hospital day # 2   Patient presented with a right-sided paresthesias along with headache and blurred vision possibly complicated migraine episode since she had similar episode before was elected [place] TIA due to chronic left ICA occlusion with collaterals.  She also has a silent right occipital infarct but CT angiogram  of neck shows progressive extracranial right carotid stenosis hence agree with plan for emergent right carotid revascularization.  Avoid hypotension and keep systolic blood pressure 353-614 range.  Continue dual antiplatelet therapy aspirin and Plavix and aggressive risk factor modification.  Long discussion with patient and answered questions.  Stroke team will sign off.  Kindly call for questions. Antony Contras, MD Medical Director [facility] Pager: [phone] 04/18/2022 3:18 PM   To contact Stroke Continuity provider, please refer to [email or web address] After hours, contact General Neurology

## 2022-04-18 NOTE — Anesthesia Preprocedure Evaluation (Signed)
Anesthesia Evaluation  Patient identified by MRN, date of birth, ID band Patient awake    Reviewed: Allergy & Precautions, NPO status , Patient's Chart, lab work & pertinent test results  Airway Mallampati: II  TM Distance: >3 FB Neck ROM: Full    Dental  (+) Dental Advisory Given   Pulmonary asthma , sleep apnea , COPD, Patient abstained from smoking., former smoker,    breath sounds clear to auscultation       Cardiovascular hypertension, Pt. on medications and Pt. on home beta blockers + CAD, + Peripheral Vascular Disease and +CHF   Rhythm:Regular Rate:Normal     Neuro/Psych Seizures -,  CVA    GI/Hepatic Neg liver ROS, hiatal hernia, GERD  ,  Endo/Other  diabetes, Type 2  Renal/GU CRFRenal disease     Musculoskeletal   Abdominal   Peds  Hematology  (+) Blood dyscrasia, anemia ,   Anesthesia Other Findings   Reproductive/Obstetrics                             Lab Results  Component Value Date   WBC 18.3 (H) 04/15/2022   HGB 12.2 04/15/2022   HCT 36.0 04/15/2022   MCV 95.8 04/15/2022   PLT 224 04/15/2022   Lab Results  Component Value Date   CREATININE 1.80 (H) 04/15/2022   BUN 19 04/15/2022   NA 135 04/15/2022   K 3.9 04/15/2022   CL 104 04/15/2022   CO2 19 (L) 04/15/2022    Anesthesia Physical Anesthesia Plan  ASA: 4  Anesthesia Plan: General   Post-op Pain Management: Tylenol PO (pre-op)* and Minimal or no pain anticipated   Induction: Intravenous  PONV Risk Score and Plan: 3 and Dexamethasone, Ondansetron and Treatment may vary due to age or medical condition  Airway Management Planned: Oral ETT  Additional Equipment: Arterial line  Intra-op Plan:   Post-operative Plan: Extubation in OR and Possible Post-op intubation/ventilation  Informed Consent: I have reviewed the patients History and Physical, chart, labs and discussed the procedure including the  risks, benefits and alternatives for the proposed anesthesia with the patient or authorized representative who has indicated his/her understanding and acceptance.     Dental advisory given  Plan Discussed with: CRNA  Anesthesia Plan Comments:         Anesthesia Quick Evaluation

## 2022-04-18 NOTE — Op Note (Signed)
NAME: [name]    MRN: [id number] DOB: 1946-07-10    DATE OF OPERATION: 04/18/2022  PREOP DIAGNOSIS:    Symptomatic right-sided ICA stenosis  POSTOP DIAGNOSIS:    Same  PROCEDURE:    Right-sided transcarotid artery revascularization  SURGEON: [name]  ASSIST: Leontine Locket, PA  ANESTHESIA: General  EBL: 15 mL  INDICATIONS:    [name] is a 76 y.o. female with symptomatic right-sided ICA stenosis.  Patient had a recent TIA, from which she has recovered.  After discussing the risk benefits of right-sided transcarotid artery revascularization, [name] elected to proceed.  FINDINGS:   Greater than 90% stenosis of the right internal carotid artery  TECHNIQUE:   The patient was brought to the operating room, where support lines were placed and general anesthesia was secured. The right neck and left groin were prepped and the patient was sterilely draped. A vertical 4 cm incision was made between the sternal and clavicular heads of the sternocleidomastoid muscle, below the omohyoid. Following longitudinal division of the carotid sheath the jugular vein was partially dissected and retracted medially. Once 3 cm of common carotid artery (CCA) were isolated, umbilical tape was placed around the proximal 1/3 of the CCA under direct vision. A 5.0 polypropylene suture was pre-placed in the anterior wall of the CCA, in a "U stitch" configuration, close to the clavicle to facilitate hemostasis upon removal of the arterial sheath at completion of the TCAR procedure.  The contralateral (left) common femoral vein (CFV) was accessed under ultrasound guidance, using standard Seldinger and micropuncture access technique. Permanent recorded image(s) was/were saved in the patient's medical record. The Venous Return Sheath was advanced into the CFV over the 0.035" wire provided. Blood was aspirated from the flow line followed by flushing of the Venous Sheath with heparinized saline.  The Venous Sheath was secured to the patient's skin with suture to maintain optimal position in the vessel.  Heparin was given to obtain a therapeutic activated clotting time >250 seconds prior to arterial access. Being there was significant calcific disease, I elected to pre-- clamp.  Once clamped, 4-French non-stiffened ENHANCE Transcarotid / Peripheral Access set was used, puncturing the artery with the 21G needle through the pre-placed "U" stitch while holding gentle traction on the umbilical tape to stabilize and centralize the CCA within the incision. Careful attention was paid to the change in CCA shape when using the umbilical tape to control or lift the artery. The micropuncture wire was then advanced 3-4 cm into the CCA and, the 21G needle was removed. The micropuncture sheath was advanced 2-3 cm into the CCA and the wire and dilator were removed. Pulsatile backflow indicated correct positioning. The provided 0.035" J-tipped guidewire was inserted as close as possible to the bifurcation without engaging the lesion. After micropuncture sheath removal, the Transcarotid Arterial Sheath was advanced to the 2.5cm marker and the 0.035" wire and dilator were then removed. Arterial Sheath position was assessed under fluoroscopy in two projections to ensure that the sheath tip was oriented coaxially in the CCA. The Arterial Sheath was sutured to the patient with gentle forward tension. Blood was slowly aspirated followed by flushing with heparinized saline. No ingress of air bubbles through the passive hemostatic valve was observed. The stopcocks were closed. Traction applied to the CCA previously to facilitate access was gently released.  The Flow Controller was connected to the Transcarotid Arterial Sheath, prepared by passively allowing a column of arterial blood to fill the  line and connected to the Venous Return Sheath. To confirm flow reversal, a saline bolus was delivered into the venous flow line on  both "High" and "Low" flow settings of the Flow Controller. Angiograms were performed with slow injections of a small amount of contrast filling just past the lesion to minimize antegrade transmission of micro-bubbles.  Prior to lesion manipulation, heart rate (24MPN) and systolic BP (361-443XVQM) were managed upwards to optimize flow reversal and procedural neuroprotection. The lesion was crossed with an 0.014" ENROUTE guidewire and pre-dilation of the lesion was performed with a 52m x 350mrapid exchange 0.014" compatible balloon catheter to 8 atmospheres for 10 seconds.  Patient became bradycardic therefore the balloon was deflated. Stenting was performed with an 67m64m 41m54mROUTE Transcarotid stent, sized appropriately to the right CCA. AP and lateral angiograms (gentle contrast injections) were performed to confirm stent placement and arterial wall stent apposition.  [facility], antegrade flow was restored by releasing the clamp on the CCA then closing the NPS stopcocks to the flow lines. The Transcarotid Arterial Sheath was removed and the pre-closure suture was tied. Heparin reversal was employed and a drain was placed.  The Venous Return Sheath was removed and hemostasis was achieved with brief manual compression.  The patient tolerated the procedure well and was extubated on the table. The patient was moving all four extremities to command prior to transfer to the recovery room.  Given the complexity of the case a first assistant was necessary in order to expedient the procedure and safely perform the technical aspects of the operation.  [name] [facility] DI[facility]ATION:   04/18/2022

## 2022-04-18 NOTE — Transfer of Care (Signed)
Immediate Anesthesia Transfer of Care Note  Patient: [name]  Procedure(s) Performed: Transcarotid Artery Revascularization (Right: Neck)  Patient Location: PACU  Anesthesia Type:General  Level of Consciousness: awake, alert  and oriented  Airway & Oxygen Therapy: Patient Spontanous Breathing and Patient connected to nasal cannula oxygen  Post-op Assessment: Report given to RN and Post -op Vital signs reviewed and stable  Post vital signs: Reviewed and stable  Last Vitals:  Vitals Value Taken Time  BP 78/47 04/18/22 1248  Temp 36.6 C 04/18/22 1245  Pulse 84 04/18/22 1253  Resp 12 04/18/22 1253  SpO2 96 % 04/18/22 1253  Vitals shown include unvalidated device data.  Last Pain:  Vitals:   04/18/22 1245  TempSrc:   PainSc: 0-No pain      Patients Stated Pain Goal: 3 (68/37/29 0211)  Complications: No notable events documented.

## 2022-04-18 NOTE — Anesthesia Procedure Notes (Signed)
Arterial Line Insertion Start/End7/03/2022 9:50 AM Performed by: Janace Litten, CRNA, CRNA  Patient location: Pre-op. Preanesthetic checklist: patient identified, IV checked, surgical consent, monitors and equipment checked and pre-op evaluation Lidocaine 1% used for infiltration Left, radial was placed Catheter size: 20 G Hand hygiene performed  and maximum sterile barriers used   Attempts: 1 Procedure performed without using ultrasound guided technique.

## 2022-04-18 NOTE — Discharge Instructions (Signed)
   [facility]  Discharge Instructions   Carotid Surgery  Please refer to the following instructions for your post-procedure care. Your surgeon or physician assistant will discuss any changes with you.  Activity  You are encouraged to walk as much as you can. You can slowly return to normal activities but must avoid strenuous activity and heavy lifting until your doctor tell you it's okay. Avoid activities such as vacuuming or swinging a golf club. You can drive after one week if you are comfortable and you are no longer taking prescription pain medications. It is normal to feel tired for serval weeks after your surgery. It is also normal to have difficulty with sleep habits, eating, and bowel movements after surgery. These will go away with time.  Bathing/Showering  Shower daily after you go home. Do not soak in a bathtub, hot tub, or swim until the incision heals completely.  Incision Care  Shower every day. Clean your incision with mild soap and water. Pat the area dry with a clean towel. You do not need a bandage unless otherwise instructed. Do not apply any ointments or creams to your incision. You may have skin glue on your incision. Do not peel it off. It will come off on its own in about one week. Your incision may feel thickened and raised for several weeks after your surgery. This is normal and the skin will soften over time.   For Men Only: It's okay to shave around the incision but do not shave the incision itself for 2 weeks. It is common to have numbness under your chin that could last for several months.  Diet  Resume your normal diet. There are no special food restrictions following this procedure. A low fat/low cholesterol diet is recommended for all patients with vascular disease. In order to heal from your surgery, it is CRITICAL to get adequate nutrition. Your body requires vitamins, minerals, and protein. Vegetables are the best source of  vitamins and minerals. Vegetables also provide the perfect balance of protein. Processed food has little nutritional value, so try to avoid this.  Medications  Resume taking all of your medications unless your doctor or physician assistant tells you not to. If your incision is causing pain, you may take over-the- counter pain relievers such as acetaminophen (Tylenol). If you were prescribed a stronger pain medication, please be aware these medications can cause nausea and constipation. Prevent nausea by taking the medication with a snack or meal. Avoid constipation by drinking plenty of fluids and eating foods with a high amount of fiber, such as fruits, vegetables, and grains.   Do not take Tylenol if you are taking prescription pain medications.  Follow Up  Our office will schedule a follow up appointment 2-3 weeks following discharge.  Please call us immediately for any of the following conditions  . Increased pain, redness, drainage (pus) from your incision site. . Fever of 101 degrees or higher. . If you should develop stroke (slurred speech, difficulty swallowing, weakness on one side of your body, loss of vision) you should call 911 and go to the nearest emergency room. .  Reduce your risk of vascular disease:  . Stop smoking. If you would like help call [facility] at 1-800-QUIT-NOW (1-800-784-8669) or [place] at [phone]. . Manage your cholesterol . Maintain a desired weight . Control your diabetes . Keep your blood pressure down .  If you have any questions, please call the office at [phone]. 

## 2022-04-18 NOTE — Progress Notes (Signed)
  Daily Progress Note   Subjective: No complaints   Objective: Vitals:   04/18/22 0600 04/18/22 0913  BP: (!) 173/77 (!) 203/89  Pulse: (!) 101 (!) 110  Resp: 16 20  Temp: 97.9 F (36.6 C) 97.9 F (36.6 C)  SpO2: 95% 94%    Physical Examination Regular rate Normal. Strength Known slurred speech - this is baseline  MRI findings:  Brain: Punctate focus of reduced diffusion is present in the right occipital lobe near the occipital horn. No evidence hemorrhage. Ventricles and sulci are stable in size and configuration. Patchy and confluent areas of T2 hyperintensity in the supratentorial and pontine white matter are nonspecific but probably reflects stable chronic microvascular ischemic changes. There is no intracranial mass or mass effect. No hydrocephalus or extra-axial collection.  ASSESSMENT/PLAN:   Plan for transcarotid artery revascularization today I had a long discussion with [name] regarding her symptomatic right-sided carotid artery stenosis, after discussing the risk and benefits of cerebral revascularization, [name] elected to proceed. She is aware she is at high risk due to comorbidities as well as contralateral occlusion.   [name] MD MS [facility] [phone] 04/18/2022  10:20 AM

## 2022-04-18 NOTE — Anesthesia Postprocedure Evaluation (Signed)
Anesthesia Post Note  Patient: [name]  Procedure(s) Performed: Transcarotid Artery Revascularization (Right: Neck)     Patient location during evaluation: PACU Anesthesia Type: General Level of consciousness: awake and alert Pain management: pain level controlled Vital Signs Assessment: post-procedure vital signs reviewed and stable Respiratory status: spontaneous breathing, nonlabored ventilation, respiratory function stable and patient connected to nasal cannula oxygen Cardiovascular status: blood pressure returned to baseline and stable Postop Assessment: no apparent nausea or vomiting Anesthetic complications: no   No notable events documented.  Last Vitals:  Vitals:   04/18/22 1515 04/18/22 1530  BP: (!) 101/50 99/66  Pulse: 71 69  Resp: 14 20  Temp:    SpO2: 98% 99%    Last Pain:  Vitals:   04/18/22 1414  TempSrc: Oral  PainSc:                  Tiajuana Amass

## 2022-04-19 ENCOUNTER — Encounter (HOSPITAL_COMMUNITY): Payer: Self-pay | Admitting: Vascular Surgery

## 2022-04-19 ENCOUNTER — Other Ambulatory Visit (HOSPITAL_COMMUNITY): Payer: Self-pay

## 2022-04-19 DIAGNOSIS — I10 Essential (primary) hypertension: Secondary | ICD-10-CM | POA: Diagnosis not present

## 2022-04-19 DIAGNOSIS — N183 Chronic kidney disease, stage 3 unspecified: Secondary | ICD-10-CM | POA: Diagnosis not present

## 2022-04-19 DIAGNOSIS — G9341 Metabolic encephalopathy: Secondary | ICD-10-CM | POA: Diagnosis not present

## 2022-04-19 DIAGNOSIS — I639 Cerebral infarction, unspecified: Secondary | ICD-10-CM | POA: Diagnosis not present

## 2022-04-19 LAB — BASIC METABOLIC PANEL
Anion gap: 9 (ref 5–15)
BUN: 27 mg/dL — ABNORMAL HIGH (ref 8–23)
CO2: 23 mmol/L (ref 22–32)
Calcium: 8.1 mg/dL — ABNORMAL LOW (ref 8.9–10.3)
Chloride: 107 mmol/L (ref 98–111)
Creatinine, Ser: 1.76 mg/dL — ABNORMAL HIGH (ref 0.44–1.00)
GFR, Estimated: 30 mL/min — ABNORMAL LOW (ref >60)
Glucose, Bld: 134 mg/dL — ABNORMAL HIGH (ref 70–99)
Potassium: 4.5 mmol/L (ref 3.5–5.1)
Sodium: 139 mmol/L (ref 135–145)

## 2022-04-19 LAB — GLUCOSE, CAPILLARY: Glucose-Capillary: 117 mg/dL — ABNORMAL HIGH (ref 70–99)

## 2022-04-19 LAB — CBC
HCT: 28.7 % — ABNORMAL LOW (ref 36.0–46.0)
Hemoglobin: 9.3 g/dL — ABNORMAL LOW (ref 12.0–15.0)
MCH: 29.7 pg (ref 26.0–34.0)
MCHC: 32.4 g/dL (ref 30.0–36.0)
MCV: 91.7 fL (ref 80.0–100.0)
Platelets: 215 10*3/uL (ref 150–400)
RBC: 3.13 MIL/uL — ABNORMAL LOW (ref 3.87–5.11)
RDW: 13.5 % (ref 11.5–15.5)
WBC: 8.6 10*3/uL (ref 4.0–10.5)
nRBC: 0 % (ref 0.0–0.2)

## 2022-04-19 MED ORDER — CLOPIDOGREL BISULFATE 75 MG PO TABS
75.0000 mg | ORAL_TABLET | Freq: Every day | ORAL | 0 refills | Status: AC
  Filled 2022-04-19: qty 30, 30d supply, fill #0

## 2022-04-19 MED ORDER — ATORVASTATIN CALCIUM 40 MG PO TABS
40.0000 mg | ORAL_TABLET | Freq: Every day | ORAL | 0 refills | Status: AC
  Filled 2022-04-19: qty 30, 30d supply, fill #0

## 2022-04-19 MED ORDER — ASPIRIN 81 MG PO CHEW: 81.0000 mg | CHEWABLE_TABLET | Freq: Every day | ORAL | Status: DC

## 2022-04-19 NOTE — Progress Notes (Signed)
  Progress Note    04/19/2022 7:33 AM 1 Day Post-Op  Subjective:  Patient says she is feeling ready to go home. She is feeling minimal pain at her incision site right now. She says she has eaten some graham crackers without difficulty swallowing.    Vitals:   04/19/22 0344 04/19/22 0400  BP: (!) 126/55 (!) 137/48  Pulse: 68 66  Resp: 14 12  Temp: (!) 97.5 F (36.4 C)   SpO2: 98% 99%    Physical Exam: Cardiac:  regular Lungs:  nonlabored Incisions:  R side neck incision c/d/l. No hematoma Extremities:  bilateral grip strength equal. Neuro: intact. No slurred speech, no tongue deviation. No weakness or numbness.   CBC    Component Value Date/Time   WBC 8.6 04/19/2022 0420   RBC 3.13 (L) 04/19/2022 0420   HGB 9.3 (L) 04/19/2022 0420   HCT 28.7 (L) 04/19/2022 0420   PLT 215 04/19/2022 0420   MCV 91.7 04/19/2022 0420   MCH 29.7 04/19/2022 0420   MCHC 32.4 04/19/2022 0420   RDW 13.5 04/19/2022 0420   LYMPHSABS 3.0 04/15/2022 0943   MONOABS 0.8 04/15/2022 0943   EOSABS 0.4 04/15/2022 0943   BASOSABS 0.1 04/15/2022 0943    BMET    Component Value Date/Time   NA 139 04/19/2022 0420   K 4.5 04/19/2022 0420   CL 107 04/19/2022 0420   CO2 23 04/19/2022 0420   GLUCOSE 134 (H) 04/19/2022 0420   BUN 27 (H) 04/19/2022 0420   CREATININE 1.76 (H) 04/19/2022 0420   CALCIUM 8.1 (L) 04/19/2022 0420   GFRNONAA 30 (L) 04/19/2022 0420   GFRAA 30 (L) 10/21/2019 1904    INR    Component Value Date/Time   INR 1.2 04/15/2022 0943     Intake/Output Summary (Last 24 hours) at 04/19/2022 0733 Last data filed at 04/19/2022 0444 Gross per 24 hour  Intake 2108.3 ml  Output 650 ml  Net 1458.3 ml     Assessment/Plan:  76 y.o. female is 1 day post op, s/p: right TCAR for symptomatic RICA stenosis   -Good to discharge from a vascular standpoint -Patient is recovering well post-op. She has been up and moving -Patient is eating and drinking with no swallowing  difficulties -Neuro intact. No slurred speech, weakness or numbness. -Continue ASA, plavix, and statin    Vicente Serene, PA-C [facility] [phone] 04/19/2022 7:33 AM

## 2022-04-19 NOTE — Care Management Important Message (Signed)
Important Message  Patient Details  Name: [name] MRN: [id number] Date of Birth: 1946-03-25   Medicare Important Message Given:  Yes     Shelda Altes 04/19/2022, 10:04 AM

## 2022-04-19 NOTE — Progress Notes (Signed)
  Daily Progress Note   Subjective: No complaints   Objective: Vitals:   04/19/22 0344 04/19/22 0400  BP: (!) 126/55 (!) 137/48  Pulse: 68 66  Resp: 14 12  Temp: (!) 97.5 F (36.4 C)   SpO2: 98% 99%    Physical Examination Regular rate Normal. Strength Known slurred speech - this is baseline Tongue deviation baseline  MRI findings:  Brain: Punctate focus of reduced diffusion is present in the right occipital lobe near the occipital horn. No evidence hemorrhage. Ventricles and sulci are stable in size and configuration. Patchy and confluent areas of T2 hyperintensity in the supratentorial and pontine white matter are nonspecific but probably reflects stable chronic microvascular ischemic changes. There is no intracranial mass or mass effect. No hydrocephalus or extra-axial collection.  ASSESSMENT/PLAN:   1 Day Post-Op Rt TCAR Pt doing well. No new deficits.  BP good this morning, but was 210/98 prior to surgery. Okay for discharge as long as BP continues to be normal.  Hypertension s/p TCAR, especially with contralateral occlusion places her at a very high risk of hyperperfusion syndrome.    [name] MD MS [facility] [phone] 04/19/2022  7:56 AM

## 2022-04-19 NOTE — Progress Notes (Signed)
STROKE TEAM PROGRESS NOTE   INTERVAL HISTORY Patient is seen in her room with no family at the bedside.  Patient had elective right transcarotid artery revascularization yesterday by Dr. Unk Lightning and procedure went well.  She has no new deficits and no complaints.  Vital signs stable.  Neurological exam unchanged.  Plans are to discharge her today CBC:  Recent Labs  Lab 04/15/22 0943 04/15/22 0946 04/19/22 0420  WBC 18.3*  --  8.6  NEUTROABS 13.9*  --   --   HGB 11.4* 12.2 9.3*  HCT 36.7 36.0 28.7*  MCV 95.8  --  91.7  PLT 224  --  315   Basic Metabolic Panel:  Recent Labs  Lab 04/15/22 0943 04/15/22 0946 04/19/22 0420  NA 135 135 139  K 3.8 3.9 4.5  CL 107 104 107  CO2 19*  --  23  GLUCOSE 121* 114* 134*  BUN 17 19 27*  CREATININE 1.68* 1.80* 1.76*  CALCIUM 8.6*  --  8.1*   Lipid Panel:  Recent Labs  Lab 04/16/22 0331  CHOL 108  TRIG 90  HDL 62  CHOLHDL 1.7  VLDL 18  LDLCALC 28   HgbA1c:  Recent Labs  Lab 04/16/22 0818  HGBA1C 6.2*   Urine Drug Screen:  Recent Labs  Lab 04/15/22 1218  LABOPIA POSITIVE*  COCAINSCRNUR NONE DETECTED  LABBENZ NONE DETECTED  AMPHETMU NONE DETECTED  THCU NONE DETECTED  LABBARB NONE DETECTED    Alcohol Level  Recent Labs  Lab 04/15/22 0943  ETH <10    IMAGING past 24 hours Structural Heart Procedure  Result Date: 04/18/2022 See surgical note for result.   PHYSICAL EXAM General:  Alert, well-nourished, well-developed patient in no acute distress Respiratory:  Regular, unlabored respirations on room air  NEURO:  Mental Status: AA&Ox3  Speech/Language: speech is slightly hypophonic but without dysarthria or aphasia.  Repetition, fluency, and comprehension intact.  Cranial Nerves:  II: PERRL. Visual fields full.  III, IV, VI: EOMI. Eyelids elevate symmetrically.  V: Sensation is intact to light touch and symmetrical to face.  VII: Smile is symmetrical.   VIII: hearing intact to voice. IX, X: Phonation is  normal.  VV:OHYWVPXT shrug 5/5. XII: tongue is midline without fasciculations. Motor: 5/5 strength to all muscle groups tested.  Tone: is normal and bulk is normal Sensation- Intact to light touch bilaterally.  Coordination: FTN intact bilaterally.No drift.  Gait- deferred   ASSESSMENT/PLAN [name] is a 76 y.o. female with history of chronic L ICA occlusion, R ICA stenosis (seeing vascular surgery as an outpatient for intervention), seizures, stroke, DM, asthma, COPD, PAD, CKD stage 4 and HTN presenting with sudden onset aphasia, right arm and leg numbness, dizziness, wavy lines in her vision and a headache.  Symptoms have fully resolved.  She has a history of migraines and has had vision changes with headaches before.  MRI show punctate right occipital stroke which does not explain her symptoms.  Silent punctate right occipital infarct along with left sided TIA vs. Complicated migraine..  Patient with chronic left carotid occlusion with now worsening right carotid preocclusive stenosis now status post right carotid TCAR 04/18/22 Code Stroke CT head No acute abnormality. ASPECTS 10.    CTA head & neck chronically occluded L ICA, severe right ICA stenosis approaching radiographic string sign, right vertebral artery stenosis  MRI  punctate right occipital infarct TCD pending 2D Echo EF 45-50%, mild LVH, apical hypokinesis, interatrial septum not well visualized LDL 28  HgbA1c 6.2 VTE prophylaxis - lovenox    Diet   Diet Heart Room service appropriate? Yes with Assist; Fluid consistency: Thin   aspirin 81 mg daily prior to admission, now on aspirin 81 mg daily and clopidogrel 75 mg daily for three weeks, then clopidogrel alone Therapy recommendations:  pending Disposition:  pending, likely home  Hypertension Home meds:  amlodipine 10 mg daily, metoprolol XL 100 mg daily, losartan 50 mg daily Stable Permissive hypertension (OK if < 220/120) but gradually normalize in 5-7  days Long-term BP goal normotensive  Hyperlipidemia Home meds:  atorvastatin 20 mg daily, resumed in hospital LDL 28, goal < 70 High intensity statin not indicated as LDL below goal Continue statin at discharge  Diabetes type II Controlled Home meds:  none HgbA1c 6.2, goal < 7.0 CBGs Recent Labs    04/18/22 1620 04/18/22 2134 04/19/22 0624  GLUCAP 143* 154* 117*    SSI  Seizure disorder EEG to rule out seizure activity Continue home Keppra  Other Stroke Risk Factors Advanced Age >/= 85  Former cigarette smoker Obesity, Body mass index is 32.34 kg/m., BMI >/= 30 associated with increased stroke risk, recommend weight loss, diet and exercise as appropriate  Hx stroke  Other Active Problems none  Hospital day # 3   Patient presented with a right-sided paresthesias along with headache and blurred vision possibly complicated migraine episode  due to chronic left ICA occlusion with failure of collaterals.  She also has a silent right occipital infarct but CT angiogram  of neck shows progressive extracranial right carotid stenosis hence agree with plan for emergent right carotid revascularization.    Continue dual antiplatelet therapy aspirin and Plavix and aggressive risk factor modification.  Long discussion with patient and answered questions.  Stroke team will sign off.  Kindly call for questions. Antony Contras, MD Medical Director [facility] Pager: [phone] 04/19/2022 11:18 AM   To contact Stroke Continuity provider, please refer to [email or web address] After hours, contact General Neurology

## 2022-04-19 NOTE — Plan of Care (Signed)
DISCHARGE NOTE HOME [name] to be discharged home per MD order. Discussed prescriptions and follow up appointments with the patient. Prescriptions given to patient; medication list explained in detail. Patient verbalized understanding.  Discussed signs and symptoms of a stroke and reviewed stroke education materials that were printed in the patient's AVS.  Patient verbalized understanding and would further review the education at home.  Skin clean, dry and intact without evidence of skin break down, no evidence of skin tears noted. IV catheter discontinued intact. Site without signs and symptoms of complications. Dressing and pressure applied. Pt denies pain at the site currently. No complaints noted.  An After Visit Summary (AVS) was printed and given to the patient. Patient to be escorted via wheelchair, and discharged home via private auto.  Stephan Minister, RN

## 2022-04-19 NOTE — TOC Progression Note (Addendum)
Transition of Care [facility]) - Progression Note    Patient Details  Name: LAURENCE FOLZ MRN: [id number] Date of Birth: 02/24/1946  [facility] CM/SW Contact  Jacalyn Lefevre Edson Snowball, RN Phone Number: 04/19/2022, 10:18 AM  Clinical Narrative:     Patient from home alone. Daughter in law and son close by and assists.    Patient for discharge today. Orders for HHSP. Discussed with patient. Patient has no preference.   NCM has left message for Cindie with [facility] awaiting call back.   Cindie with [facility] accepted referral   Expected Discharge Plan: [place] Barriers to Discharge: No Barriers Identified  Expected Discharge Plan and Services Expected Discharge Plan: [place]   Discharge Planning Services: CM Consult Post Acute Care Choice: [place] arrangements for the past 2 months: Single Family Home Expected Discharge Date: 04/19/22               DME Arranged: N/A         HH Arranged: Speech Therapy HH Agency: [facility] Date [facility] Agency Contacted: 04/19/22 Time HH Agency Contacted: 23 Representative spoke with at [place]: left message for [employer]   Social Determinants of Health (SDOH) Interventions    Readmission Risk Interventions     No data to display

## 2022-04-19 NOTE — Discharge Summary (Signed)
Physician Discharge Summary   Patient: [name] MRN: [id number] DOB: 27-Nov-1945  Admit date:     04/15/2022  Discharge date: 04/19/22  Discharge Physician: Patrecia Pour   PCP: Donnajean Lopes, MD   Recommendations at discharge:  Follow up with neurology and vascular surgery s/p CVA due to right carotid artery stenosis, now s/p TCAR by Dr. Virl Cagey 7/6. Continued DAPT and atorvastatin '40mg'$  daily at discharge Monitor blood pressure closely to avoid severe HTN as well as hypotension, goal SBP, per neurology, is 120-130's. Continued recommendation to use CPAP qHS for OSA.   Discharge Diagnoses: Principal Problem:   Acute CVA (cerebrovascular accident) ([place]) Active Problems:   Accelerated hypertension   Asthma   Diabetes due to underlying condition w oth circulatory comp ([place])   PVD (peripheral vascular disease) ([place])   CKD (chronic kidney disease) stage 3, GFR 30-59 ml/min   HLD (hyperlipidemia)   Obstructive apnea   Seizures ([place])   H/O ischemic bowel disease   Acute metabolic encephalopathy  Hospital Course: [name] is a 76 y.o. female with medical history significant of CAD; DM; HLD; HTN; PVD; chronic pancreatitis; ischemic bowel with colectomy/colostomy with subsequent reversal; seizure; OSA not on CPAP; with no known carotid stenosis, planned outpatient follow-up with vascular surgery given known carotid stenosis and history of CVA.   Patient presents with strokelike symptoms having visited her PCP with right arm numbness slurred speech and given her known history and risk factors she was sent to the ED for further evaluation and treatment.  Neurology consulted at intake, imaging initially unremarkable however CTA and transcranial doppler shows a string sign concerning for imminent vascular compromise prompting vascular consultation and subsequent right TCAR 7/6 by Dr. Virl Cagey.   Assessment and Plan: Acute punctate CVA Critical right ICA bulb stenosis -Patient with  transient numbness of R forearm, R cheek, and aphasia - all of these symptoms have resolved -MRI shows punctate occipital CVA that would not explain her current symptoms -CTA head/neck without overt large vessel occlusion, chronically occluded left ICA with severe stenosis of the right ICA bulb approaching "string sign" >> now s/p TCAR 7/6 by Dr. Virl Cagey. Pain control with tylenol and patient's home hydrocodone.  -Echo done recently so will not repeat at this time -No follow up per PT or OT, ordered HH-SLP at discharge.  - DAPT PTA and neurology recommends continuing aspirin and plavix at discharge. Continue statin, change to "high-intensity" dosing as below.    PVD/CAD Chronic left ICA occlusion -remote h/o L SFA stenting -Followed with Dr. Virl Cagey on 6/30 - Given severity of cerebrovascular disease/bilateral atherosclerosis, would aim to avoid hypotension, keep SBP >153mHg at least, with ideal SBP 120-130's.    Encephalopathy, transient, resolved -Neurology following, EEG negative -Suspected to be secondary to hypotension versus low blood flow given critical carotid stenosis as above -Resolved   HTN: Poorly controlled at baseline, recent admission for hypertensive emergency -Aim for normotension, avoiding severely elevated or low BP. -Slowly improve blood pressure over the next 48 to 72 hours -Given low-dose amlodipine, continue prn hydralazine >180/100 -Hold home hydralazine, losartan, metoprolol and Maxzide    HLD: LDL is 28 - Continue atorvastatin, tolerated well, increased dose 40 mg daily   DM, well controlled, diet controlled. -A1C 6.2 -Not on home meds   Stage 3b CKD -At baseline   Chronic pancreatitis - Continue home medications.    Seizure d/o: EEG showed: This study is suggestive of cortical dysfunction arising from left temporo-parietal region,  nonspecific etiology but could be secondary to underlying structural abnormality, post-ictal state. No seizures or  epileptiform discharges were seen throughout the recording. - Continue keppra   OSA - Encourage use of CPAP   Chronic pain -Continue home med (Norco)   Mood disorder: Quiescent. -Continue Remeron, Effexor   Asthma: Well controlled -Continue Singulair, albuterol   Obesity: Estimated body mass index is 32.34 kg/m as calculated from the following:   Height as of this encounter: '5\' 3"'$  (1.6 m).   Weight as of this encounter: 82.8 kg.  Consultants: Neurology, vascular surgery Procedures performed: TCAR right 7/6 Dr. Virl Cagey  Disposition: Home Diet recommendation:  Discharge Diet Orders (From admission, onward)     Start     Ordered   04/19/22 0000  Diet - low sodium heart healthy        04/19/22 0900           Cardiac and Carb modified diet DISCHARGE MEDICATION: Allergies as of 04/19/2022       Reactions   Adhesive [tape] Other (See Comments)   "Took off my skin"   Other Rash   "Took off my skin"   Ace Inhibitors Other (See Comments)   Unknown   Sulfa Antibiotics Other (See Comments)   Unknown   Topiramate Other (See Comments)   Codeine Other (See Comments)   Altered mental state    Iodinated Contrast Media Rash        Medication List     TAKE these medications    acetaminophen 325 MG tablet Commonly known as: TYLENOL Take 650 mg by mouth every 6 (six) hours as needed for mild pain.   albuterol 108 (90 Base) MCG/ACT inhaler Commonly known as: VENTOLIN HFA Inhale 1-2 puffs into the lungs every 6 (six) hours as needed for wheezing or shortness of breath.   amLODipine 10 MG tablet Commonly known as: NORVASC Take 10 mg by mouth daily.   aspirin 81 MG chewable tablet Chew 1 tablet (81 mg total) by mouth daily.   atorvastatin 40 MG tablet Commonly known as: LIPITOR Take 1 tablet (40 mg total) by mouth daily. What changed:  medication strength how much to take   cholestyramine 4 g packet Commonly known as: QUESTRAN Take 0.5 packets by mouth in the  morning and at bedtime.   clopidogrel 75 MG tablet Commonly known as: PLAVIX Take 1 tablet (75 mg total) by mouth daily.   [employer] Digestive Probiotic 250 MG capsule Generic drug: saccharomyces boulardii Take 500 mg by mouth daily.   fluticasone 50 MCG/ACT nasal spray Commonly known as: FLONASE Place 2 sprays into both nostrils daily as needed for allergies.   furosemide 20 MG tablet Commonly known as: LASIX Take 20 mg by mouth daily.   hydrALAZINE 100 MG tablet Commonly known as: APRESOLINE Take 1 tablet (100 mg total) by mouth every 8 (eight) hours.   HYDROcodone-acetaminophen 10-325 MG tablet Commonly known as: NORCO Take 1 tablet by mouth 4 (four) times daily as needed for moderate pain.   levETIRAcetam 500 MG tablet Commonly known as: KEPPRA TAKE 1 TABLET BY MOUTH TWICE A DAY   losartan 100 MG tablet Commonly known as: COZAAR Take 1 tablet (100 mg total) by mouth daily. What changed: additional instructions   losartan 50 MG tablet Commonly known as: COZAAR Take 50 mg by mouth daily. Total of 150 mg What changed: Another medication with the same name was changed. Make sure you understand how and when to take each.  magnesium oxide 400 MG tablet Commonly known as: MAG-OX Take 1 tablet by mouth daily.   metoprolol succinate 100 MG 24 hr tablet Commonly known as: TOPROL-XL Take 100 mg by mouth daily.   mirtazapine 15 MG tablet Commonly known as: REMERON Take 15 mg by mouth at bedtime.   montelukast 10 MG tablet Commonly known as: SINGULAIR Take 10 mg by mouth daily.   nitroGLYCERIN 0.4 MG SL tablet Commonly known as: NITROSTAT Place 1 tablet (0.4 mg total) under the tongue every 5 (five) minutes as needed for chest pain.   ondansetron 4 MG tablet Commonly known as: ZOFRAN Take 1 tablet (4 mg total) by mouth every 6 (six) hours as needed for nausea or vomiting.   pantoprazole 40 MG tablet Commonly known as: PROTONIX Take 1 tablet (40 mg total) by mouth 2  (two) times daily.   Polysaccharide-Iron Complex 150 MG Caps Take 1 tablet by mouth daily.   triamterene-hydrochlorothiazide 37.5-25 MG tablet Commonly known as: MAXZIDE-25 Take 0.5 tablets by mouth every morning.   venlafaxine XR 75 MG 24 hr capsule Commonly known as: EFFEXOR-XR Take 1 capsule (75 mg total) by mouth daily.   vitamin B-12 500 MCG tablet Commonly known as: CYANOCOBALAMIN Take 500 mcg by mouth daily.   Vitamin D (Ergocalciferol) 1.25 MG (50000 UNIT) Caps capsule Commonly known as: DRISDOL Take 50,000 Units by mouth every Wednesday.   Zenpep 40000-126000 units Cpep Generic drug: Pancrelipase (Lip-Prot-Amyl) Take 2 capsules with meals and one capsule with snacks. What changed:  how much to take how to take this when to take this additional instructions               Discharge Care Instructions  (From admission, onward)           Start     Ordered   04/19/22 0000  No dressing needed        04/19/22 0900            Follow-up Information     [facility] Follow up in 4 week(s).   Specialty: Vascular Surgery Why: Office will call to arrange your appt(s) (sent) Contact information: [address] [place]        Donnajean Lopes, MD. Schedule an appointment as soon as possible for a visit.   Specialty: Internal Medicine Contact information: [place] [address] [phone]         Garvin Fila, MD. Schedule an appointment as soon as possible for a visit in 2 month(s).   Specialties: Neurology, Radiology Contact information: [address] [place] [address] [phone]                Discharge Exam: Danley Danker Weights   04/15/22 1004 04/18/22 0913 04/18/22 0932  Weight: 82.8 kg 82.8 kg 82.8 kg  BP (!) 159/65 (BP Location: Right Arm)   Pulse 73   Temp 98 F (36.7 C) (Oral)   Resp 18   Ht '5\' 3"'$  (1.6 m)   Wt  82.8 kg   SpO2 97%   BMI 32.34 kg/m   Obese, pleasant female sitting up at EOB in no distress Clear, nonlabored Right TCAR site c/d/I with dermabond, no surrounding erythema or discharge, appropriately tender. Alert, oriented without focal sensorimotor deficit in tested nerve distributions  Condition at discharge: stable  The results of significant diagnostics from this hospitalization (including imaging, microbiology, ancillary and laboratory) are listed below for reference.  Imaging Studies: Structural Heart Procedure  Result Date: 04/18/2022 See surgical note for result.  HYBRID OR IMAGING ([facility] ONLY)  Result Date: 04/18/2022 There is no interpretation for this exam.  This order is for images obtained during a surgical procedure.  Please See "Surgeries" Tab for more information regarding the procedure.   VAS [place] TRANSCRANIAL DOPPLER  Result Date: 04/16/2022  Transcranial Doppler Patient Name:  [name]  Date of Exam:   04/16/2022 Medical Rec #: [id number]       Accession #:    [id number] Date of Birth: Jan 02, 1946       Patient Gender: F Patient Age:   39 years Exam Location:  [facility] Procedure:      VAS [place] TRANSCRANIAL DOPPLER Referring Phys: PRAMOD SETHI --------------------------------------------------------------------------------  Indications: Stroke. Limitations for diagnostic windows: Unable to insonate right transtemporal window. Comparison Study: No prior studies. Performing Technologist: Darlin Coco RDMS, RVT  Examination Guidelines: A complete evaluation includes B-mode imaging, spectral Doppler, color Doppler, and power Doppler as needed of all accessible portions of each vessel. Bilateral testing is considered an integral part of a complete examination. Limited examinations for reoccurring indications may be performed as noted.  +----------+-------------+----------+-----------+------------------+ RIGHT TCD Right VM (cm)Depth (cm)Pulsatility     Comment        +----------+-------------+----------+-----------+------------------+ MCA                                         Unable to insonate +----------+-------------+----------+-----------+------------------+ ACA                                         Unable to insonate +----------+-------------+----------+-----------+------------------+ Term ICA                                    Unable to insonate +----------+-------------+----------+-----------+------------------+ PCA                                         Unable to insonate +----------+-------------+----------+-----------+------------------+ Opthalmic     17.00                 1.55                       +----------+-------------+----------+-----------+------------------+ ICA siphon    50.00                 1.41                       +----------+-------------+----------+-----------+------------------+ Vertebral    -27.00                 1.01                       +----------+-------------+----------+-----------+------------------+  +----------+------------+----------+-----------+----------------------------+ LEFT TCD  Left VM (cm)Depth (cm)Pulsatility          Comment            +----------+------------+----------+-----------+----------------------------+ MCA          73.00                 1.18                                 +----------+------------+----------+-----------+----------------------------+  ACA                                             Unable to insonate      +----------+------------+----------+-----------+----------------------------+ Term ICA     122.00                1.10                                 +----------+------------+----------+-----------+----------------------------+ PCA           9.00                  3.0    No discernable end diastolic +----------+------------+----------+-----------+----------------------------+ Opthalmic     7.00                  3.0    No  discernable end diastolic +----------+------------+----------+-----------+----------------------------+ ICA siphon   11.00                  3.0    No discernable end diastolic +----------+------------+----------+-----------+----------------------------+ Vertebral    -21.00                0.73                                 +----------+------------+----------+-----------+----------------------------+  +------------+------+-------+             VM cm Comment +------------+------+-------+ Prox Basilar-45.00        +------------+------+-------+ Dist Basilar-48.00        +------------+------+-------+ Summary:  Absent right temporal and poor left temporal windows limit evauation of anterior circulation.Abnormal left opthalmic wavfeform with elevated left terminal ICA velocities may suggest proximal left ICA occlusion with collateral flow. Normal mean flow velocities in basilar and both vertebral arteries. *See table(s) above for TCD measurements and observations.  Vascular consult recommended. Diagnosing physician: Antony Contras MD Electronically signed by Antony Contras MD on 04/16/2022 at 3:24:50 PM.    Final    EEG adult  Result Date: 04/16/2022 Lora Havens, MD     04/16/2022  1:27 PM Patient Name: [name] MRN: [id number] Epilepsy Attending: Lora Havens Referring Physician/Provider: Katy Apo, NP Date: 04/16/2022 Duration: 22.06 mins Patient history: 76yo F with ams. EEG to evaluate for seizure. Level of alertness: Awake, asleep AEDs during EEG study: LEV Technical aspects: This EEG study was done with scalp electrodes positioned according to the 10-20 International system of electrode placement. Electrical activity was acquired at a sampling rate of '500Hz'$  and reviewed with a high frequency filter of '70Hz'$  and a low frequency filter of '1Hz'$ . EEG data were recorded continuously and digitally stored. Description: The posterior dominant rhythm consists of 8-9 Hz activity of  moderate voltage (25-35 uV) seen predominantly in posterior head regions, symmetric and reactive to eye opening and eye closing.  Sleep was characterized by vertex waves, sleep spindles (12 to 14 Hz), maximal frontocentral region. EEG showed intermittent 3 to 6 Hz theta-delta slowing in left temporo-parietal region. Hyperventilation and photic stimulation were not performed.   ABNORMALITY - Intermittent slow, left temporo-parietal region IMPRESSION: This study is suggestive of cortical dysfunction arising from left temporo-parietal region, nonspecific etiology but could be secondary to underlying structural abnormality, post-ictal  state. No seizures or epileptiform discharges were seen throughout the recording. [name]   CT ANGIO HEAD NECK W WO CM  Result Date: 04/16/2022 CLINICAL DATA:  76 year old female code stroke presentation yesterday. Punctate right occipital white matter infarct on MRI. EXAM: CT ANGIOGRAPHY HEAD AND NECK TECHNIQUE: Multidetector CT imaging of the head and neck was performed using the standard protocol during bolus administration of intravenous contrast. Multiplanar CT image reconstructions and MIPs were obtained to evaluate the vascular anatomy. Carotid stenosis measurements (when applicable) are obtained utilizing NASCET criteria, using the distal internal carotid diameter as the denominator. RADIATION DOSE REDUCTION: This exam was performed according to the departmental dose-optimization program which includes automated exposure control, adjustment of the mA and/or kV according to patient size and/or use of iterative reconstruction technique. CONTRAST:  52m OMNIPAQUE IOHEXOL 350 MG/ML SOLN COMPARISON:  Head CT and brain MRI yesterday. CTA head and neck 03/14/2022.  Thyroid ultrasound 05/18/2020. FINDINGS: CT HEAD Brain: Stable non contrast CT appearance of the brain. Small right occipital lobe white matter infarct by MRI is occult by CT. Calvarium and skull base: No acute  osseous abnormality identified. Paranasal sinuses: Visualized paranasal sinuses and mastoids are stable and well aerated. Orbits: No acute orbit or scalp soft tissue finding. CTA NECK Skeleton: Right TMJ degeneration. Lower cervical spine disc and endplate degeneration. No acute osseous abnormality identified. Upper chest: Centrilobular emphysema. Patchy right lung ground-glass opacity has regressed since last month. No superior mediastinal lymphadenopathy. Other neck: Stable neck soft tissues. Thyroid nodules on the right side This has been evaluated on previous imaging. (ref: J Am Coll Radiol. 2015 Feb;12(2): 143-50). Aortic arch: Severe Calcified aortic atherosclerosis. 3 vessel arch configuration. Right carotid system: Brachiocephalic artery and right carotid atherosclerosis appear not significantly changed, including high-grade stenosis of the right ICA bulb approaching a radiographic string sign on series 9, image 99 and series 11, image 199. Right ICA remains patent to the skull base. Left carotid system: Stable atherosclerotic left CCA with high-grade origin stenosis and chronically occluded cervical left ICA with no reconstitution to the skull base. Vertebral arteries: Proximal right subclavian artery plaque with up to 50% stenosis. Moderate stenosis right vertebral artery origin is stable on series 12, image 128. Right vertebral artery is otherwise negative to the skull base. Stable proximal left subclavian artery atherosclerosis. Normal left vertebral artery origin. Left vertebral is patent to the skull base without stenosis. CTA HEAD Posterior circulation: Codominant distal vertebral arteries with V4 calcified plaque but only mild distal vertebral stenosis. Normal vertebrobasilar junction and basilar artery. Normal SCA and PCA origins. Left PCA branches are within normal limits. Mild right PCA irregularity is stable. Left posterior communicating artery is normal. Anterior circulation: Right ICA siphon is  patent with calcified plaque and mild to moderate right siphon stenosis. Left ICA siphon is reconstituted as before from the left PCOM. Patent carotid termini, MCA and ACA origins. Dominant right and diminutive left ACA A1 segments again noted. Anterior communicating artery and bilateral ACA branches are within normal limits. Right MCA M1 segment and bifurcation remain patent. Right MCA branches are stable with mild irregularity. Left MCA M1 segment bifurcates early without stenosis. Left MCA branches are stable with mild irregularity in the posterior most M2 division. Venous sinuses: Early contrast timing, not evaluated. Anatomic variants: Dominant right and diminutive left ACA A1 segments. Review of the MIP images confirms the above findings IMPRESSION: 1. Stable CTA Head And Neck since last month, negative for large vessel occlusion. But  Positive for: - chronically occluded Left ICA in the neck. Left ICA terminus is reconstituted by the PComm. - Severe underlying cervical carotid atherosclerosis. High-grade Right ICA bulb stenosis approaching a RADIOGRAPHIC STRING SIGN. - up to moderate stenosis of the Right Vertebral Artery origin, Right ICA siphon. - Aortic Atherosclerosis (ICD10-I70.0 2. Stable non contrast CT appearance of the brain. Small right occipital lobe white matter infarct by MRI remains occult by CT. 3. Emphysema (ICD10-J43.9). Electronically Signed   By: Genevie Ann M.D.   On: 04/16/2022 11:14   MR BRAIN WO CONTRAST  Result Date: 04/15/2022 CLINICAL DATA:  Neuro deficit, acute, stroke suspected EXAM: MRI HEAD WITHOUT CONTRAST TECHNIQUE: Multiplanar, multiecho pulse sequences of the brain and surrounding structures were obtained without intravenous contrast. COMPARISON:  04/08/2022 FINDINGS: Brain: Punctate focus of reduced diffusion is present in the right occipital lobe near the occipital horn. No evidence hemorrhage. Ventricles and sulci are stable in size and configuration. Patchy and confluent  areas of T2 hyperintensity in the supratentorial and pontine white matter are nonspecific but probably reflects stable chronic microvascular ischemic changes. There is no intracranial mass or mass effect. No hydrocephalus or extra-axial collection. Vascular: Loss of the left ICA flow void to the clinoid is unchanged. Skull and upper cervical spine: Normal marrow signal is preserved. Sinuses/Orbits: No new finding. Other: Sella is partially empty.  Mastoid air cells are clear. IMPRESSION: Punctate right occipital acute infarct. Otherwise no significant change since recent prior study. Electronically Signed   By: Macy Mis M.D.   On: 04/15/2022 10:23   CT HEAD CODE STROKE WO CONTRAST  Result Date: 04/15/2022 CLINICAL DATA:  Code stroke.  Neuro deficit, acute, stroke suspected EXAM: CT HEAD WITHOUT CONTRAST TECHNIQUE: Contiguous axial images were obtained from the base of the skull through the vertex without intravenous contrast. RADIATION DOSE REDUCTION: This exam was performed according to the departmental dose-optimization program which includes automated exposure control, adjustment of the mA and/or kV according to patient size and/or use of iterative reconstruction technique. COMPARISON:  03/14/2022 FINDINGS: Brain: No acute intracranial hemorrhage, mass effect, or edema. No new loss of gray-white differentiation. Ventricles and sulci are stable in size and configuration. Patchy hypoattenuation in the supratentorial white matter probably reflects stable chronic microvascular ischemic changes. No extra-axial collection. Vascular: No hyperdense vessel. Skull: Unremarkable. Sinuses/Orbits: No acute finding. Other: Mastoid air cells are clear. ASPECTS ([place] Stroke Program Early CT Score) - Ganglionic level infarction (caudate, lentiform nuclei, internal capsule, insula, M1-M3 cortex): 7 - Supraganglionic infarction (M4-M6 cortex): 3 Total score (0-10 with 10 being normal): 10 IMPRESSION: There is no acute  intracranial hemorrhage or evidence of acute infarction. ASPECT score is 10. These results were communicated to Dr. Lorrin Goodell at 9:48 am on 04/15/2022 by text page via the [facility] messaging system. Electronically Signed   By: Macy Mis M.D.   On: 04/15/2022 09:57   MR BRAIN WO CONTRAST  Result Date: 04/09/2022 CLINICAL DATA:  Initial evaluation for neuro deficit, stroke suspected. EXAM: MRI HEAD WITHOUT CONTRAST TECHNIQUE: Multiplanar, multiecho pulse sequences of the brain and surrounding structures were obtained without intravenous contrast. COMPARISON:  Prior study from 03/14/2022. FINDINGS: Brain: Examination degraded by motion. Age-related cerebral atrophy with moderate chronic small vessel ischemic disease. No acute or subacute infarct. No areas of chronic cortical infarction. No acute or chronic intracranial blood products. No mass lesion, midline shift or mass effect. No hydrocephalus or extra-axial fluid collection. Pituitary gland suprasellar region within normal limits. Vascular: Chronic left ICA occlusion  noted. Major intracranial vascular flow voids are otherwise maintained. Skull and upper cervical spine: Craniocervical junctional limits. Bone marrow signal intensity normal. Scalp soft tissues demonstrate no acute finding. Focal soft tissue thinning at the left frontal scalp noted. Sinuses/Orbits: Prior bilateral ocular lens replacement. Scattered mucosal thickening noted within the ethmoidal air cells. No significant mastoid effusion. Other: None. IMPRESSION: 1. No acute intracranial abnormality. 2. Age-related cerebral atrophy with moderate chronic small vessel ischemic disease. 3. Chronic left ICA occlusion. Electronically Signed   By: Jeannine Boga M.D.   On: 04/09/2022 00:43   DG Chest 1 View  Result Date: 04/08/2022 CLINICAL DATA:  Shortness of breath, cough EXAM: CHEST  1 VIEW COMPARISON:  12/25/2021 FINDINGS: Heart and mediastinal contours are within normal limits. No focal  opacities or effusions. No acute bony abnormality. Aortic atherosclerosis. IMPRESSION: No active disease. Electronically Signed   By: Rolm Baptise M.D.   On: 04/08/2022 20:20    Microbiology: Results for orders placed or performed during the hospital encounter of 04/15/22  Surgical pcr screen     Status: None   Collection Time: 04/18/22  9:21 AM   Specimen: Nasal Mucosa; Nasal Swab  Result Value Ref Range Status   MRSA, PCR NEGATIVE NEGATIVE Final   Staphylococcus aureus NEGATIVE NEGATIVE Final    Comment: (NOTE) The Xpert SA Assay (FDA approved for NASAL specimens in patients 84 years of age and older), is one component of a comprehensive surveillance program. It is not intended to diagnose infection nor to guide or monitor treatment. Performed at [facility], [place] [address], [place], [place] [address]     Labs: CBC: Recent Labs  Lab 04/15/22 [phone] 04/15/22 0946 04/19/22 0420  WBC 18.3*  --  8.6  NEUTROABS 13.9*  --   --   HGB 11.4* 12.2 9.3*  HCT 36.7 36.0 28.7*  MCV 95.8  --  91.7  PLT 224  --  409   Basic Metabolic Panel: Recent Labs  Lab 04/15/22 0943 04/15/22 0946 04/19/22 0420  NA 135 135 139  K 3.8 3.9 4.5  CL 107 104 107  CO2 19*  --  23  GLUCOSE 121* 114* 134*  BUN 17 19 27*  CREATININE 1.68* 1.80* 1.76*  CALCIUM 8.6*  --  8.1*   Liver Function Tests: Recent Labs  Lab 04/15/22 0943  AST 29  ALT 21  ALKPHOS 100  BILITOT 0.8  PROT 6.3*  ALBUMIN 3.2*   CBG: Recent Labs  Lab 04/18/22 0811 04/18/22 1245 04/18/22 1620 04/18/22 2134 04/19/22 0624  GLUCAP 108* 123* 143* 154* 117*    Discharge time spent: greater than 30 minutes.  Signed: Patrecia Pour, MD [facility] 04/19/2022

## 2022-04-26 DIAGNOSIS — I130 Hypertensive heart and chronic kidney disease with heart failure and stage 1 through stage 4 chronic kidney disease, or unspecified chronic kidney disease: Secondary | ICD-10-CM | POA: Diagnosis not present

## 2022-04-26 DIAGNOSIS — H538 Other visual disturbances: Secondary | ICD-10-CM | POA: Diagnosis not present

## 2022-04-26 DIAGNOSIS — N390 Urinary tract infection, site not specified: Secondary | ICD-10-CM | POA: Diagnosis not present

## 2022-04-26 DIAGNOSIS — G839 Paralytic syndrome, unspecified: Secondary | ICD-10-CM | POA: Diagnosis not present

## 2022-04-26 DIAGNOSIS — D72825 Bandemia: Secondary | ICD-10-CM | POA: Diagnosis not present

## 2022-04-26 DIAGNOSIS — E1151 Type 2 diabetes mellitus with diabetic peripheral angiopathy without gangrene: Secondary | ICD-10-CM | POA: Diagnosis not present

## 2022-04-26 DIAGNOSIS — R4781 Slurred speech: Secondary | ICD-10-CM | POA: Diagnosis not present

## 2022-04-26 DIAGNOSIS — I63511 Cerebral infarction due to unspecified occlusion or stenosis of right middle cerebral artery: Secondary | ICD-10-CM | POA: Diagnosis not present

## 2022-04-26 DIAGNOSIS — N1832 Chronic kidney disease, stage 3b: Secondary | ICD-10-CM | POA: Diagnosis not present

## 2022-04-30 DIAGNOSIS — I5040 Unspecified combined systolic (congestive) and diastolic (congestive) heart failure: Secondary | ICD-10-CM | POA: Diagnosis not present

## 2022-04-30 DIAGNOSIS — G40909 Epilepsy, unspecified, not intractable, without status epilepticus: Secondary | ICD-10-CM | POA: Diagnosis not present

## 2022-04-30 DIAGNOSIS — I63511 Cerebral infarction due to unspecified occlusion or stenosis of right middle cerebral artery: Secondary | ICD-10-CM | POA: Diagnosis not present

## 2022-04-30 DIAGNOSIS — G4733 Obstructive sleep apnea (adult) (pediatric): Secondary | ICD-10-CM | POA: Diagnosis not present

## 2022-04-30 DIAGNOSIS — I130 Hypertensive heart and chronic kidney disease with heart failure and stage 1 through stage 4 chronic kidney disease, or unspecified chronic kidney disease: Secondary | ICD-10-CM | POA: Diagnosis not present

## 2022-04-30 DIAGNOSIS — N1832 Chronic kidney disease, stage 3b: Secondary | ICD-10-CM | POA: Diagnosis not present

## 2022-04-30 DIAGNOSIS — J4520 Mild intermittent asthma, uncomplicated: Secondary | ICD-10-CM | POA: Diagnosis not present

## 2022-04-30 DIAGNOSIS — D72825 Bandemia: Secondary | ICD-10-CM | POA: Diagnosis not present

## 2022-04-30 DIAGNOSIS — E1151 Type 2 diabetes mellitus with diabetic peripheral angiopathy without gangrene: Secondary | ICD-10-CM | POA: Diagnosis not present

## 2022-05-01 ENCOUNTER — Telehealth: Payer: Self-pay

## 2022-05-01 NOTE — Telephone Encounter (Signed)
Pt's son, Jeneen Rinks, called with questions about pt's BP.  Reviewed pt's chart, returned call for clarification, two identifiers used. He stated that the pt had not been feeling well this morning. She'd taken her BP with his wrist cuff and got readings of 243/143 and 240/136. He was unsure if pt had taken BP meds today.  Called pt, no answer, unable to leave msg.  Called son, recommended that she go to ED. Son stated that either he or his sister would take her. Instructed him to call 911 if they couldn't give her a ride.  Pt called, stating that she'd just taken her lunchtime med. She had not taken it yesterday because her BP had been too low. She saw her PCP yesterday. Strongly recommended that she go to the ED. Pt wanted to wait about an hour and retake her BP before going. Reiterated how dangerous it was for her BP to be that high.  Called son, who was now at [facility], and reiterated the need for her to go to ED. Confirmed understanding.

## 2022-05-06 ENCOUNTER — Ambulatory Visit: Payer: Self-pay

## 2022-05-06 ENCOUNTER — Other Ambulatory Visit: Payer: Self-pay

## 2022-05-06 NOTE — Patient Outreach (Signed)
[place] [place] [facility]) Care Management  05/06/2022  [name] 05-20-1946 [id number]    EMMI-STROKE RED ON EMMI ALERT Day # 13 Date: 05/03/22 Red Alert Reason: 'Went to follow up appt? No Scheduled follow up appt? No"   Outreach attempt # 1 to patient. Spoke with patient who denies any acute issues or concerns at present. Reviewed and addressed red alerts. Patient confirms she saw PCP last week. She states she responded to questions that way ash she is awaiting call from vascular office for follow up appt per d/c summary. Provided patient with office contact info and encouraged her to go ahead and call and make an appt. Also,, discussed need for follow up appt with neuro per d/c instructions. Patient aware-states she will all and make all appts today. Denies any transportation issues. She confirms she has all her meds in the home. Stets one day last week BP was elevated but has since been back normal and at her baseline. Patient has completed automated post discharge EMMI-Stroke calls. Patient verbalizes no further RN CM needs or concerns at this time.   Plan: RN CM will close case.  Enzo Montgomery, RN,BSN,CCM [place] [facility] Telephonic Care Management Coordinator Direct Phone: [phone] Toll Free: [phone] Fax: [phone]

## 2022-05-07 ENCOUNTER — Other Ambulatory Visit: Payer: Self-pay | Admitting: *Deleted

## 2022-05-07 DIAGNOSIS — I6521 Occlusion and stenosis of right carotid artery: Secondary | ICD-10-CM

## 2022-05-08 DIAGNOSIS — M170 Bilateral primary osteoarthritis of knee: Secondary | ICD-10-CM | POA: Diagnosis not present

## 2022-05-23 NOTE — Progress Notes (Signed)
Office Note     HPI: [name] is a 76 y.o. (November 01, 1945) female presenting s/p right TCAR on 7/6 for symptomatic carotid stenosis.  Today, [name] was doing well.  She was complaining of some dizziness that has been present for the last week.  She was recently put on new medications for blood pressure, and stated that she felt awful.  She has since stopped those medications and some of the dizziness is improved.  The most appreciable when she stands.  Dizziness is not present when laying down.  She denies symptoms of TIA, stroke, amaurosis.  The pt is  on a statin for cholesterol management.  The pt is  on a daily aspirin.   Other AC:  plavix The pt is  on medication for hypertension.     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 known 60-70% mid-LAD stenosis with caths in 1999, 2002, 2006, and 2012 showing stable anatomy b. 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[place])    Diabetes mellitus    Diverticulosis    Dizziness    chronic   Dyslipidemia    Fatty liver    GERD (gastroesophageal reflux disease)    hiatial hernia   Heart murmur    Hiatal hernia    Hypertension    Irritable bowel syndrome    Pancreatitis, chronic ([place])    Peripheral vascular disease ([place]) 02/20/2010   s/p stent of left SFA; carotid stenosis   Seizure ([place]) 11/04/2018   last 1 yr 2020 per pt unsure month    Sleep apnea    no cpap    Stroke ([place])    Thyroid nodule    Vitamin B12 deficiency     Past Surgical History:  Procedure Laterality Date   ABDOMINAL HYSTERECTOMY     partial   AUGMENTATION MAMMAPLASTY     bilateral 1976 retro    BREAST ENHANCEMENT SURGERY     CARDIAC CATHETERIZATION  07/07/98;08/31/01;03/04/05   60 -70% LAD   CATARACT EXTRACTION Bilateral    COLON RESECTION N/A 10/14/2017    Procedure: EXPLORATORY LAPAROTOMY, EXTENDED RIGHT COLECTOMY; APPLICATION OF ABDOMINAL VACUUM DRESSING;  Surgeon: Jovita Kussmaul, MD;  Location: [facility];  Service: General;  Laterality: N/A;   COLONOSCOPY     ENDOBRONCHIAL ULTRASOUND Bilateral 02/24/2017   Procedure: ENDOBRONCHIAL ULTRASOUND;  Surgeon: Collene Gobble, MD;  Location: [facility];  Service: Cardiopulmonary;  Laterality: Bilateral;   FEMORAL ARTERY STENT     Left leg   LAPAROTOMY N/A 10/16/2017   Procedure: EXPLORATORY LAPAROTOMY, PARTIAL OMENTECTOMY, RESECTION ISCHEMIC ILEUM 124PY, APPLICATION OF VAC ABDOMINAL DRESSING;  Surgeon: Armandina Gemma, MD;  Location: [facility];  Service: General;  Laterality: N/A;   LAPAROTOMY N/A 10/18/2017   Procedure: RE-EXPLORATION OF ABDOMEN, ILEOSTOMY CREATION;  Surgeon: Stark Klein, MD;  Location: [facility];  Service: General;  Laterality: N/A;   LUNG SURGERY  03/2017   Benign polyps removed   PATELLA REALIGNMENT Left    POLYPECTOMY     SIGMOIDOSCOPY     TONSILLECTOMY     TRANSCAROTID ARTERY REVASCULARIZATION  Right 04/18/2022   Procedure: Transcarotid Artery Revascularization;  Surgeon: [name], MD;  Location: [place];  Service: Vascular;  Laterality: Right;    Social History   Socioeconomic History   Marital status: Divorced    Spouse name: Not on file   Number of children: 2   Years of education: Not on file   Highest education level: Not on file  Occupational History   Occupation: retired    Fish farm manager: DISABLED  Tobacco Use   Smoking status: Former    Packs/day: 1.00    Years: 50.00    Total pack years: 50.00    Types: Cigarettes    Quit date: 10/14/2010    Years since quitting: 11.6   Smokeless tobacco: Never  Vaping Use   Vaping Use: Never used  Substance and Sexual Activity   Alcohol use: No   Drug use: No   Sexual activity: Not Currently    Comment: Hysterectomy  Other Topics Concern   Not on file  Social History Narrative   Not on file   Social Determinants of  Health   Financial Resource Strain: Not on file  Food Insecurity: Not on file  Transportation Needs: Not on file  Physical Activity: Not on file  Stress: Not on file  Social Connections: Not on file  Intimate Partner Violence: Not on file    Family History  Problem Relation Age of Onset   Hypertension Mother    Osteoarthritis Mother    Pulmonary embolism Mother    Hypertension Father        aneurysm   Stroke Father    Colon cancer Paternal Grandfather    Stroke Other    Breast cancer Neg Hx    Colon polyps Neg Hx    Esophageal cancer Neg Hx    Rectal cancer Neg Hx    Stomach cancer Neg Hx    Pancreatic cancer Neg Hx    Liver disease Neg Hx     Current Outpatient Medications  Medication Sig Dispense Refill   acetaminophen (TYLENOL) 325 MG tablet Take 650 mg by mouth every 6 (six) hours as needed for mild pain.     albuterol (VENTOLIN HFA) 108 (90 Base) MCG/ACT inhaler Inhale 1-2 puffs into the lungs every 6 (six) hours as needed for wheezing or shortness of breath.     amLODipine (NORVASC) 10 MG tablet Take 10 mg by mouth daily.     aspirin 81 MG chewable tablet Chew 1 tablet (81 mg total) by mouth daily.     atorvastatin (LIPITOR) 40 MG tablet Take 1 tablet (40 mg total) by mouth daily. 30 tablet 0   cholestyramine (QUESTRAN) 4 g packet Take 0.5 packets by mouth in the morning and at bedtime.     clopidogrel (PLAVIX) 75 MG tablet Take 1 tablet (75 mg total) by mouth daily. 30 tablet 0   [employer] DIGESTIVE PROBIOTIC 250 MG capsule Take 500 mg by mouth daily.     fluticasone (FLONASE) 50 MCG/ACT nasal spray Place 2 sprays into both nostrils daily as needed for allergies.     furosemide (LASIX) 20 MG tablet Take 20 mg by mouth daily.     hydrALAZINE (APRESOLINE) 100 MG tablet Take 1 tablet (100 mg total) by mouth every 8 (eight) hours. 90 tablet 0   HYDROcodone-acetaminophen (NORCO) 10-325 MG tablet Take 1 tablet by mouth 4 (four) times daily as needed for moderate pain.      levETIRAcetam (KEPPRA) 500 MG tablet TAKE 1 TABLET BY MOUTH TWICE A DAY (Patient taking differently: Take 500 mg by mouth 2 (two) times daily.) 180 tablet 2  losartan (COZAAR) 100 MG tablet Take 1 tablet (100 mg total) by mouth daily. (Patient taking differently: Take 100 mg by mouth daily. Total of '150mg'$ ) 45 tablet 8   losartan (COZAAR) 50 MG tablet Take 50 mg by mouth daily. Total of 150 mg     magnesium oxide (MAG-OX) 400 MG tablet Take 1 tablet by mouth daily.     metoprolol succinate (TOPROL-XL) 100 MG 24 hr tablet Take 100 mg by mouth daily.     mirtazapine (REMERON) 15 MG tablet Take 15 mg by mouth at bedtime.     montelukast (SINGULAIR) 10 MG tablet Take 10 mg by mouth daily.     nitroGLYCERIN (NITROSTAT) 0.4 MG SL tablet Place 1 tablet (0.4 mg total) under the tongue every 5 (five) minutes as needed for chest pain. 90 tablet 3   ondansetron (ZOFRAN) 4 MG tablet Take 1 tablet (4 mg total) by mouth every 6 (six) hours as needed for nausea or vomiting. 40 tablet 1   Pancrelipase, Lip-Prot-Amyl, (ZENPEP) 40000-126000 units CPEP Take 2 capsules with meals and one capsule with snacks. (Patient taking differently: Take 1 capsule by mouth daily with breakfast.) 240 capsule 0   pantoprazole (PROTONIX) 40 MG tablet Take 1 tablet (40 mg total) by mouth 2 (two) times daily. 180 tablet 2   Polysaccharide-Iron Complex 150 MG CAPS Take 1 tablet by mouth daily.     triamterene-hydrochlorothiazide (MAXZIDE-25) 37.5-25 MG tablet Take 0.5 tablets by mouth every morning.     venlafaxine XR (EFFEXOR-XR) 75 MG 24 hr capsule Take 1 capsule (75 mg total) by mouth daily.     vitamin B-12 (CYANOCOBALAMIN) 500 MCG tablet Take 500 mcg by mouth daily.     Vitamin D, Ergocalciferol, (DRISDOL) 1.25 MG (50000 UT) CAPS capsule Take 50,000 Units by mouth every Wednesday.      No current facility-administered medications for this visit.    Allergies  Allergen Reactions   Adhesive [Tape] Other (See Comments)    "Took  off my skin"   Other Rash    "Took off my skin"   Ace Inhibitors Other (See Comments)    Unknown   Sulfa Antibiotics Other (See Comments)    Unknown   Topiramate Other (See Comments)   Codeine Other (See Comments)    Altered mental state    Iodinated Contrast Media Rash     REVIEW OF SYSTEMS:   '[X]'$  denotes positive finding, '[ ]'$  denotes negative finding Cardiac  Comments:  Chest pain or chest pressure:    Shortness of breath upon exertion:    Short of breath when lying flat:    Irregular heart rhythm:        Vascular    Pain in calf, thigh, or hip brought on by ambulation:    Pain in feet at night that wakes you up from your sleep:     Blood clot in your veins:    Leg swelling:         Pulmonary    Oxygen at home:    Productive cough:     Wheezing:         Neurologic    Sudden weakness in arms or legs:     Sudden numbness in arms or legs:     Sudden onset of difficulty speaking or slurred speech:    Temporary loss of vision in one eye:     Problems with dizziness:         Gastrointestinal    Blood in stool:  Vomited blood:         Genitourinary    Burning when urinating:     Blood in urine:        Psychiatric    Major depression:         Hematologic    Bleeding problems:    Problems with blood clotting too easily:        Skin    Rashes or ulcers:        Constitutional    Fever or chills:      PHYSICAL EXAMINATION:  There were no vitals filed for this visit.  General:  WDWN in NAD; vital signs documented above Gait: Not observed HENT: WNL, normocephalic, right neck incision well-healed Pulmonary: normal non-labored breathing , without wheezing Cardiac: regular HR, Abdomen: soft, NT, no masses Skin: without rashes Vascular Exam/Pulses:  Right Left  Radial 2+ (normal) 2+ (normal)  Ulnar 2+ (normal) 2+ (normal)                   Extremities: without ischemic changes, without Gangrene , without cellulitis; without open wounds;   Musculoskeletal: no muscle wasting or atrophy  Neurologic: A&O X 3;  No focal weakness or paresthesias are detected Psychiatric:  The pt has Normal affect.   Non-Invasive Vascular Imaging:   Bilateral carotid duplex ultrasound demonstrates widely patent right-sided ICA stent, known occlusion of the left carotid artery.  Bilateral vertebral arteries patent with no flow-limiting stenosis.    ASSESSMENT/PLAN: JAQUAY MORNEAULT is a 76 y.o. female presenting that is post 04/2022 right TCAR for symptomatic ICA stenosis.  She is doing well postoperatively.  No vascular etiology for the patient's dizzy episodes.  Some concern for orthostatic hypotension.  She has a scheduled appointment with her primary care Dr. Philip Aspen next week.  I plan to see Darleen in 9 months with repeat carotid duplex ultrasound.   [name], MD [facility] [phone]

## 2022-05-24 ENCOUNTER — Ambulatory Visit (HOSPITAL_COMMUNITY)
Admission: RE | Admit: 2022-05-24 | Discharge: 2022-05-24 | Disposition: A | Discharge status: Home / Self Care | Payer: Medicare HMO | Source: Ambulatory Visit | Attending: Vascular Surgery | Admitting: Vascular Surgery

## 2022-05-24 ENCOUNTER — Other Ambulatory Visit: Payer: Self-pay

## 2022-05-24 ENCOUNTER — Ambulatory Visit (INDEPENDENT_AMBULATORY_CARE_PROVIDER_SITE_OTHER): Payer: Medicare HMO | Admitting: Vascular Surgery

## 2022-05-24 ENCOUNTER — Encounter: Payer: Self-pay | Admitting: Vascular Surgery

## 2022-05-24 VITALS — BP 126/73 | HR 100 | Temp 97.8°F | Resp 20 | Ht 63.0 in | Wt 164.0 lb

## 2022-05-24 DIAGNOSIS — I779 Disorder of arteries and arterioles, unspecified: Secondary | ICD-10-CM

## 2022-05-24 DIAGNOSIS — Z959 Presence of cardiac and vascular implant and graft, unspecified: Secondary | ICD-10-CM

## 2022-05-24 DIAGNOSIS — I6521 Occlusion and stenosis of right carotid artery: Secondary | ICD-10-CM | POA: Diagnosis not present

## 2022-05-24 DIAGNOSIS — I6522 Occlusion and stenosis of left carotid artery: Secondary | ICD-10-CM

## 2022-05-27 DIAGNOSIS — D649 Anemia, unspecified: Secondary | ICD-10-CM | POA: Diagnosis not present

## 2022-05-27 DIAGNOSIS — G40909 Epilepsy, unspecified, not intractable, without status epilepticus: Secondary | ICD-10-CM | POA: Diagnosis not present

## 2022-05-27 DIAGNOSIS — N1832 Chronic kidney disease, stage 3b: Secondary | ICD-10-CM | POA: Diagnosis not present

## 2022-05-27 DIAGNOSIS — R42 Dizziness and giddiness: Secondary | ICD-10-CM | POA: Diagnosis not present

## 2022-05-27 DIAGNOSIS — I63511 Cerebral infarction due to unspecified occlusion or stenosis of right middle cerebral artery: Secondary | ICD-10-CM | POA: Diagnosis not present

## 2022-05-27 DIAGNOSIS — I5040 Unspecified combined systolic (congestive) and diastolic (congestive) heart failure: Secondary | ICD-10-CM | POA: Diagnosis not present

## 2022-05-27 DIAGNOSIS — I130 Hypertensive heart and chronic kidney disease with heart failure and stage 1 through stage 4 chronic kidney disease, or unspecified chronic kidney disease: Secondary | ICD-10-CM | POA: Diagnosis not present

## 2022-05-27 DIAGNOSIS — E1151 Type 2 diabetes mellitus with diabetic peripheral angiopathy without gangrene: Secondary | ICD-10-CM | POA: Diagnosis not present

## 2022-05-27 DIAGNOSIS — J4520 Mild intermittent asthma, uncomplicated: Secondary | ICD-10-CM | POA: Diagnosis not present

## 2022-06-04 DIAGNOSIS — K219 Gastro-esophageal reflux disease without esophagitis: Secondary | ICD-10-CM | POA: Diagnosis not present

## 2022-06-04 DIAGNOSIS — E785 Hyperlipidemia, unspecified: Secondary | ICD-10-CM | POA: Diagnosis not present

## 2022-06-04 DIAGNOSIS — E1122 Type 2 diabetes mellitus with diabetic chronic kidney disease: Secondary | ICD-10-CM | POA: Diagnosis not present

## 2022-06-04 DIAGNOSIS — N1832 Chronic kidney disease, stage 3b: Secondary | ICD-10-CM | POA: Diagnosis not present

## 2022-06-04 DIAGNOSIS — D509 Iron deficiency anemia, unspecified: Secondary | ICD-10-CM | POA: Diagnosis not present

## 2022-06-04 DIAGNOSIS — R609 Edema, unspecified: Secondary | ICD-10-CM | POA: Diagnosis not present

## 2022-06-04 DIAGNOSIS — I129 Hypertensive chronic kidney disease with stage 1 through stage 4 chronic kidney disease, or unspecified chronic kidney disease: Secondary | ICD-10-CM | POA: Diagnosis not present

## 2022-06-05 ENCOUNTER — Other Ambulatory Visit (HOSPITAL_COMMUNITY): Payer: Self-pay | Admitting: *Deleted

## 2022-06-06 ENCOUNTER — Encounter (HOSPITAL_COMMUNITY)
Admission: RE | Admit: 2022-06-06 | Discharge: 2022-06-06 | Disposition: A | Discharge status: Home / Self Care | Payer: Medicare HMO | Source: Ambulatory Visit | Attending: Internal Medicine | Admitting: Internal Medicine

## 2022-06-06 DIAGNOSIS — D649 Anemia, unspecified: Secondary | ICD-10-CM | POA: Insufficient documentation

## 2022-06-06 MED ORDER — SODIUM CHLORIDE 0.9 % IV SOLN
510.0000 mg | INTRAVENOUS | Status: DC
  Administered 2022-06-06: 510 mg via INTRAVENOUS
  Filled 2022-06-06: qty 510

## 2022-06-13 ENCOUNTER — Encounter (HOSPITAL_COMMUNITY)
Admission: RE | Admit: 2022-06-13 | Discharge: 2022-06-13 | Disposition: A | Discharge status: Home / Self Care | Payer: Medicare HMO | Source: Ambulatory Visit | Attending: Internal Medicine | Admitting: Internal Medicine

## 2022-06-13 DIAGNOSIS — D649 Anemia, unspecified: Secondary | ICD-10-CM | POA: Diagnosis not present

## 2022-06-13 MED ORDER — SODIUM CHLORIDE 0.9 % IV SOLN
510.0000 mg | INTRAVENOUS | Status: DC
  Administered 2022-06-13: 510 mg via INTRAVENOUS
  Filled 2022-06-13: qty 510

## 2022-06-13 NOTE — Progress Notes (Signed)
Spoke with DJ at office to let her know patient stated she has felt achy and not good all week after feraheme. Per Nurse we should be okay to proceed .

## 2022-07-17 DIAGNOSIS — M170 Bilateral primary osteoarthritis of knee: Secondary | ICD-10-CM | POA: Diagnosis not present

## 2022-07-17 DIAGNOSIS — M25562 Pain in left knee: Secondary | ICD-10-CM | POA: Diagnosis not present

## 2022-07-17 DIAGNOSIS — M25561 Pain in right knee: Secondary | ICD-10-CM | POA: Diagnosis not present

## 2022-07-22 DIAGNOSIS — D649 Anemia, unspecified: Secondary | ICD-10-CM | POA: Diagnosis not present

## 2022-07-22 DIAGNOSIS — E538 Deficiency of other specified B group vitamins: Secondary | ICD-10-CM | POA: Diagnosis not present

## 2022-07-22 DIAGNOSIS — Z961 Presence of intraocular lens: Secondary | ICD-10-CM | POA: Diagnosis not present

## 2022-07-22 DIAGNOSIS — H52223 Regular astigmatism, bilateral: Secondary | ICD-10-CM | POA: Diagnosis not present

## 2022-07-22 DIAGNOSIS — H5203 Hypermetropia, bilateral: Secondary | ICD-10-CM | POA: Diagnosis not present

## 2022-07-22 DIAGNOSIS — R7989 Other specified abnormal findings of blood chemistry: Secondary | ICD-10-CM | POA: Diagnosis not present

## 2022-07-22 DIAGNOSIS — E041 Nontoxic single thyroid nodule: Secondary | ICD-10-CM | POA: Diagnosis not present

## 2022-07-22 DIAGNOSIS — E1151 Type 2 diabetes mellitus with diabetic peripheral angiopathy without gangrene: Secondary | ICD-10-CM | POA: Diagnosis not present

## 2022-07-22 DIAGNOSIS — D509 Iron deficiency anemia, unspecified: Secondary | ICD-10-CM | POA: Diagnosis not present

## 2022-07-22 DIAGNOSIS — Z135 Encounter for screening for eye and ear disorders: Secondary | ICD-10-CM | POA: Diagnosis not present

## 2022-07-22 DIAGNOSIS — E559 Vitamin D deficiency, unspecified: Secondary | ICD-10-CM | POA: Diagnosis not present

## 2022-07-22 DIAGNOSIS — N1832 Chronic kidney disease, stage 3b: Secondary | ICD-10-CM | POA: Diagnosis not present

## 2022-07-22 DIAGNOSIS — H04123 Dry eye syndrome of bilateral lacrimal glands: Secondary | ICD-10-CM | POA: Diagnosis not present

## 2022-07-22 DIAGNOSIS — E785 Hyperlipidemia, unspecified: Secondary | ICD-10-CM | POA: Diagnosis not present

## 2022-07-22 DIAGNOSIS — I10 Essential (primary) hypertension: Secondary | ICD-10-CM | POA: Diagnosis not present

## 2022-07-22 DIAGNOSIS — H524 Presbyopia: Secondary | ICD-10-CM | POA: Diagnosis not present

## 2022-07-29 DIAGNOSIS — Z0000 Encounter for general adult medical examination without abnormal findings: Secondary | ICD-10-CM | POA: Diagnosis not present

## 2022-07-29 DIAGNOSIS — E1122 Type 2 diabetes mellitus with diabetic chronic kidney disease: Secondary | ICD-10-CM | POA: Diagnosis not present

## 2022-07-29 DIAGNOSIS — J439 Emphysema, unspecified: Secondary | ICD-10-CM | POA: Diagnosis not present

## 2022-07-29 DIAGNOSIS — D509 Iron deficiency anemia, unspecified: Secondary | ICD-10-CM | POA: Diagnosis not present

## 2022-07-29 DIAGNOSIS — I5022 Chronic systolic (congestive) heart failure: Secondary | ICD-10-CM | POA: Diagnosis not present

## 2022-07-29 DIAGNOSIS — R82998 Other abnormal findings in urine: Secondary | ICD-10-CM | POA: Diagnosis not present

## 2022-07-29 DIAGNOSIS — G4733 Obstructive sleep apnea (adult) (pediatric): Secondary | ICD-10-CM | POA: Diagnosis not present

## 2022-07-29 DIAGNOSIS — E46 Unspecified protein-calorie malnutrition: Secondary | ICD-10-CM | POA: Diagnosis not present

## 2022-07-29 DIAGNOSIS — K861 Other chronic pancreatitis: Secondary | ICD-10-CM | POA: Diagnosis not present

## 2022-07-29 DIAGNOSIS — I2510 Atherosclerotic heart disease of native coronary artery without angina pectoris: Secondary | ICD-10-CM | POA: Diagnosis not present

## 2022-07-29 DIAGNOSIS — Z1339 Encounter for screening examination for other mental health and behavioral disorders: Secondary | ICD-10-CM | POA: Diagnosis not present

## 2022-07-29 DIAGNOSIS — E1151 Type 2 diabetes mellitus with diabetic peripheral angiopathy without gangrene: Secondary | ICD-10-CM | POA: Diagnosis not present

## 2022-07-29 DIAGNOSIS — Z1331 Encounter for screening for depression: Secondary | ICD-10-CM | POA: Diagnosis not present

## 2022-08-09 DIAGNOSIS — E785 Hyperlipidemia, unspecified: Secondary | ICD-10-CM | POA: Diagnosis not present

## 2022-08-09 DIAGNOSIS — R7989 Other specified abnormal findings of blood chemistry: Secondary | ICD-10-CM | POA: Diagnosis not present

## 2022-08-09 DIAGNOSIS — I10 Essential (primary) hypertension: Secondary | ICD-10-CM | POA: Diagnosis not present

## 2022-08-15 ENCOUNTER — Other Ambulatory Visit: Payer: Medicare HMO

## 2022-08-15 ENCOUNTER — Encounter: Payer: Self-pay | Admitting: Physician Assistant

## 2022-08-15 ENCOUNTER — Ambulatory Visit (INDEPENDENT_AMBULATORY_CARE_PROVIDER_SITE_OTHER): Payer: Medicare HMO | Admitting: Physician Assistant

## 2022-08-15 VITALS — BP 142/80 | HR 63 | Ht 63.0 in | Wt 169.2 lb

## 2022-08-15 DIAGNOSIS — R110 Nausea: Secondary | ICD-10-CM

## 2022-08-15 DIAGNOSIS — R197 Diarrhea, unspecified: Secondary | ICD-10-CM | POA: Diagnosis not present

## 2022-08-15 DIAGNOSIS — K219 Gastro-esophageal reflux disease without esophagitis: Secondary | ICD-10-CM | POA: Diagnosis not present

## 2022-08-15 NOTE — Patient Instructions (Addendum)
Restart Questran 4 g - in water or juice twice daily. ( Take 2 hours away from meals and other medication.)   Use Imodium - 1 tablet by mouth at bedtime.   Continue Pantoprazole- Take 1 pill by mouth twice daily.   Continue Zenpep- 2 capsule with meals and 1 capsule with snacks.  Your provider has requested that you go to the basement level for lab work before leaving today. Press "B" on the elevator. The lab is located at the first door on the left as you exit the elevator.  _______________________________________________________  If you are age 39 or older, your body mass index should be between 23-30. Your Body mass index is 29.98 kg/m. If this is out of the aforementioned range listed, please consider follow up with your Primary Care Provider.  If you are age 67 or younger, your body mass index should be between 19-25. Your Body mass index is 29.98 kg/m. If this is out of the aformentioned range listed, please consider follow up with your Primary Care Provider.   ________________________________________________________  The [facility] providers would like to encourage you to use [facility] to communicate with providers for non-urgent requests or questions.  Due to long hold times on the telephone, sending your provider a message by [facility] may be a faster and more efficient way to get a response.  Please allow 48 business hours for a response.  Please remember that this is for non-urgent requests.  _______________________________________________________  Due to recent changes in healthcare laws, you may see the results of your imaging and laboratory studies on MyChart before your provider has had a chance to review them.  We understand that in some cases there may be results that are confusing or concerning to you. Not all laboratory results come back in the same time frame and the provider may be waiting for multiple results in order to interpret others.  Please give [place] 48 hours in order for  your provider to thoroughly review all the results before contacting the office for clarification of your results.   Thank you for choosing me and [facility].  Amy Esterwood PA-C

## 2022-08-15 NOTE — Progress Notes (Signed)
Subjective:    Patient ID: [name], female    DOB: November 27, 1945, 76 y.o.   MRN: [id number]  HPI  [name] is a 76 year old white female, established with Dr. Fuller Plan, with multiple significant comorbidities.  She was last seen here in January 2023 with complaints of nausea, and upper abdominal discomfort, and ultimately underwent EGD 11/01/2021 that showed diffuse mild gastritis with erythema friability in the gastric fundus and body and a few scattered erosions in the cardia consistent with [name] erosions as well as a medium sized hiatal hernia.  Biopsies showed mild chronic gastritis no H. pylori and parietal cell hyperplasia  She comes in today still having issues with nausea but taking twice daily Protonix and Zofran as needed, and is more concerned about diarrhea.  She is currently on her second course of antibiotics for urinary tract infection, says her diarrhea got worse with the first antibiotic and is persisting but now is not any worse on the second antibiotic.  She does not have much appetite during the daytime, usually gets hungry in the evenings eats more in the evenings and is struggling with diarrhea at nighttime.  She has a prior diagnosis of IBS, possible pancreatic insufficiency though no chronic pancreatitis by MRI in 2022.  She had been given prescription for Questran to take twice daily and says she has not been taking that regularly recently she does stay on Zenpep  40/126,000 -2 tablets with meals and 1 with snacks. She is currently having up to 8-9 bowel movements per day most all of them are loose, not malodorous, no melena or hematochezia, no fevers  She had a CVA in July 2023, is on Plavix and aspirin and underwent carotid stenting.  She has history of severe ischemic colitis 2019 for which she underwent right hemicolectomy with ileostomy, then reversal.,  Has known peripheral arterial disease, congestive heart failure, adult onset diabetes mellitus, multivessel coronary  artery disease, COPD and chronic kidney disease stage IV Echo 2023 EF 45 to 50%.  Review of Systems Pertinent positive and negative review of systems were noted in the above HPI section.  All other review of systems was otherwise negative.   Outpatient Encounter Medications as of 08/15/2022  Medication Sig   acetaminophen (TYLENOL) 325 MG tablet Take 650 mg by mouth every 6 (six) hours as needed for mild pain.   albuterol (VENTOLIN HFA) 108 (90 Base) MCG/ACT inhaler Inhale 1-2 puffs into the lungs every 6 (six) hours as needed for wheezing or shortness of breath.   amLODipine (NORVASC) 10 MG tablet Take 10 mg by mouth daily.   aspirin 81 MG chewable tablet Chew 1 tablet (81 mg total) by mouth daily.   atorvastatin (LIPITOR) 40 MG tablet Take 1 tablet (40 mg total) by mouth daily.   cholestyramine (QUESTRAN) 4 g packet Take 0.5 packets by mouth in the morning and at bedtime.   clopidogrel (PLAVIX) 75 MG tablet Take 1 tablet (75 mg total) by mouth daily.   [facility] DIGESTIVE PROBIOTIC 250 MG capsule Take 500 mg by mouth daily.   fluticasone (FLONASE) 50 MCG/ACT nasal spray Place 2 sprays into both nostrils daily as needed for allergies.   furosemide (LASIX) 20 MG tablet Take 20 mg by mouth daily.   hydrALAZINE (APRESOLINE) 100 MG tablet Take 1 tablet (100 mg total) by mouth every 8 (eight) hours.   HYDROcodone-acetaminophen (NORCO) 10-325 MG tablet Take 1 tablet by mouth 4 (four) times daily as needed for moderate pain.   levETIRAcetam (  KEPPRA) 500 MG tablet TAKE 1 TABLET BY MOUTH TWICE A DAY (Patient taking differently: Take 500 mg by mouth 2 (two) times daily.)   losartan (COZAAR) 100 MG tablet Take 1 tablet (100 mg total) by mouth daily. (Patient taking differently: Take 100 mg by mouth daily. Total of '150mg'$ )   losartan (COZAAR) 50 MG tablet Take 50 mg by mouth daily. Total of 150 mg   magnesium oxide (MAG-OX) 400 MG tablet Take 1 tablet by mouth daily.   metoprolol succinate (TOPROL-XL) 100 MG 24  hr tablet Take 100 mg by mouth daily.   mirtazapine (REMERON) 15 MG tablet Take 15 mg by mouth at bedtime.   montelukast (SINGULAIR) 10 MG tablet Take 10 mg by mouth daily.   nitroGLYCERIN (NITROSTAT) 0.4 MG SL tablet Place 1 tablet (0.4 mg total) under the tongue every 5 (five) minutes as needed for chest pain.   ondansetron (ZOFRAN) 4 MG tablet Take 1 tablet (4 mg total) by mouth every 6 (six) hours as needed for nausea or vomiting.   Pancrelipase, Lip-Prot-Amyl, (ZENPEP) 40000-126000 units CPEP Take 2 capsules with meals and one capsule with snacks. (Patient taking differently: Take 1 capsule by mouth daily with breakfast.)   pantoprazole (PROTONIX) 40 MG tablet Take 1 tablet (40 mg total) by mouth 2 (two) times daily.   Polysaccharide-Iron Complex 150 MG CAPS Take 1 tablet by mouth daily.   triamterene-hydrochlorothiazide (MAXZIDE-25) 37.5-25 MG tablet Take 0.5 tablets by mouth every morning.   venlafaxine XR (EFFEXOR-XR) 75 MG 24 hr capsule Take 1 capsule (75 mg total) by mouth daily.   vitamin B-12 (CYANOCOBALAMIN) 500 MCG tablet Take 500 mcg by mouth daily.   Vitamin D, Ergocalciferol, (DRISDOL) 1.25 MG (50000 UT) CAPS capsule Take 50,000 Units by mouth every Wednesday.    No facility-administered encounter medications on file as of 08/15/2022.   Allergies  Allergen Reactions   Adhesive [Tape] Other (See Comments)    "Took off my skin"   Other Rash    "Took off my skin"   Ace Inhibitors Other (See Comments)    Unknown   Sulfa Antibiotics Other (See Comments)    Unknown   Topiramate Other (See Comments)   Codeine Other (See Comments)    Altered mental state    Iodinated Contrast Media Rash   Patient Active Problem List   Diagnosis Date Noted   Acute CVA (cerebrovascular accident) ([place]) 04/15/2022   Hypertensive urgency 04/09/2022   History of seizures as a child 04/09/2022   Chronic combined systolic and diastolic CHF (congestive heart failure) ([place]) 04/09/2022   Pyuria  04/09/2022   CKD (chronic kidney disease) stage 4, GFR 15-29 ml/min ([place]) 04/09/2022   Sepsis ([place]) 12/26/2021   UTI (urinary tract infection) 12/26/2021   Aspiration pneumonia ([place]) 12/26/2021   Acute renal failure superimposed on stage 4 chronic kidney disease ([place]) 12/26/2021   Acute on chronic systolic CHF (congestive heart failure) ([place]) 12/26/2021   Stroke ([place]) 12/25/2021   Pharyngeal dysphagia 07/12/2021   Pressure ulcer of sacral region 07/12/2021   Wound of abdomen 07/12/2021   Closed fracture of distal end of left radius 06/15/2021   Degeneration of lumbar intervertebral disc 04/23/2021   Hyponatremia 87/86/7672   Metabolic acidosis, NAG, failure of bicarbonate regeneration 11/16/2020   Nausea & vomiting 11/16/2020   Acute-on-chronic kidney injury ([place]) 11/16/2020   Abdominal aortic atherosclerosis ([place]) 06/01/2020   Acquired thrombophilia ([place]) 06/01/2020   Insomnia 05/08/2020   Neck pain 01/11/2020   Referred otalgia of  both ears 01/11/2020   Frequency of micturition 12/16/2019   Orthostatic hypotension 12/16/2019   Urinary tract infectious disease 12/16/2019   Pneumonia due to coronavirus disease 2019 11/17/2019   Acute combined systolic and diastolic heart failure ([place]) 10/29/2019   COVID-19 10/29/2019   Hyperkalemia 10/16/2019   Bilateral pneumonia 81/27/5170   Acute metabolic encephalopathy 01/74/9449   Noninfective gastroenteritis and colitis, unspecified 09/01/2019   Hypo-osmolality and hyponatremia 08/04/2019   Spasm 06/03/2019   Seizures ([place]) 01/07/2019   Cerebrovascular accident (CVA) due to occlusion of left carotid artery ([place]) 01/07/2019   CAD, multiple vessel 01/07/2019   H/O ischemic bowel disease 01/07/2019   Protein calorie malnutrition ([place]) 01/04/2019   Acute bronchitis 12/04/2018   Oral phase dysphagia 12/04/2018   Personal history of transient ischemic attack (TIA), and cerebral infarction without residual deficits 11/11/2018   CKD (chronic  kidney disease) stage 3, GFR 30-59 ml/min 11/04/2018   Seizure ([place]) 11/03/2018   Anorectal disorder 06/08/2018   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  Ileostomy 10/18/2017 10/14/2017   Ischemic colitis ([place]) 10/14/2017   Acute respiratory failure with hypoxemia ([place]) 10/14/2017   Acute post-operative pain 10/14/2017   Acute kidney injury superimposed on CKD ([place]) 10/14/2017   Anemia 10/14/2017   Benign paroxysmal positional vertigo 07/29/2017   Nocturia more than twice per night 05/07/2017   Diabetes due to underlying condition w oth circulatory comp ([place]) 05/07/2017   PVD (peripheral vascular disease) ([place]) 05/07/2017   Poor sleep hygiene 05/07/2017   Paradoxical insomnia 05/07/2017   Abnormal feces 04/10/2017   Hilar lymphadenopathy    Hemoptysis 02/19/2017   Pulmonary nodules/lesions, multiple 02/19/2017   Tobacco use 02/19/2017   Disorder of arteries and arterioles, unspecified ([place]) 01/31/2017   Lung field abnormal 01/31/2017   RUQ abdominal pain 01/13/2017   Restless legs syndrome 11/28/2016   Left-sided extracranial carotid artery occlusion 11/26/2016   PAOD (peripheral arterial occlusive disease) ([place]) 11/26/2016   Encounter for routine follow-up after surgery of the circulatory system 11/26/2016   Abnormal weight loss 01/29/2016   Allergic rhinitis 01/29/2016   Chest pain with moderate risk of acute coronary syndrome 10/27/2015   Other slipping, tripping and stumbling without falling, initial encounter 07/28/2015   Acid indigestion 12/31/2013   Pain in left hip 08/26/2013   Screening for gout 05/13/2013   Disorder of skin or subcutaneous tissue  12/22/2012   Difficult or painful urination 09/02/2012   Peripheral blood vessel disorder ([place]) 05/11/2012   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[place]) 05/11/2012   Follow-up examination, following unspecified surgery 05/11/2012   Occlusion and stenosis of carotid artery without mention of cerebral infarction 04/14/2012   Fatigue 09/24/2011   Gonalgia 07/04/2011   Avitaminosis D 05/02/2011   Chronic pancreatitis ([place]) 04/30/2011   Vitamin B12 deficiency 04/30/2011   Intestinal motility disorder 04/30/2011   Fatty liver 04/30/2011   Adiposity 04/30/2011   Other and unspecified general psychiatric examination 04/26/2011   Abdominal pain 04/09/2011   Iron deficiency anemia, unspecified  04/09/2011   Other general symptoms  04/09/2011   Asthma 04/09/2011   Type 2 diabetes mellitus with diabetic peripheral angiopathy without gangrene ([place]) 01/24/2011   Abnormal weight gain 11/30/2010  Dyslipidemia    Dizziness    Right-sided extracranial carotid artery stenosis    Normal nuclear stress test    Feeling bilious 07/11/2010   Underimmunization status 07/11/2010   Pancreas (digestive gland) works poorly 05/30/2010   B12 deficiency 03/19/2010   DVT 03/16/2010   BLIND LOOP SYNDROME 03/16/2010   Diarrhea 03/16/2010   Chronic depression 02/23/2010   Major depression, single episode 02/23/2010   Cough 11/17/2009   Plantar verruca 11/17/2009   Arteriosclerosis of coronary artery 11/16/2009   Claudication ([place]) 11/16/2009   Compulsive tobacco user syndrome 11/16/2009   HLD (hyperlipidemia) 11/16/2009   Nontoxic single thyroid nodule 11/16/2009   Obstructive apnea 11/16/2009   Radial styloid tenosynovitis 11/16/2009   LBP (low back pain) 11/16/2009   Gastroparesis 11/16/2009   LAXATIVE ABUSE 12/17/2007   Accelerated hypertension 12/17/2007   Acid reflux 12/17/2007   HIATAL HERNIA 12/17/2007   IBS 12/17/2007   MELANOSIS COLI 12/17/2007   ARTHRITIS 12/17/2007   HEPATOMEGALY  12/17/2007   Social History   Socioeconomic History   Marital status: Divorced    Spouse name: Not on file   Number of children: 2   Years of education: Not on file   Highest education level: Not on file  Occupational History   Occupation: retired    Fish farm manager: DISABLED  Tobacco Use   Smoking status: Former    Packs/day: 1.00    Years: 50.00    Total pack years: 50.00    Types: Cigarettes    Quit date: 10/14/2010    Years since quitting: 11.8   Smokeless tobacco: Never  Vaping Use   Vaping Use: Never used  Substance and Sexual Activity   Alcohol use: No   Drug use: No   Sexual activity: Not Currently    Comment: Hysterectomy  Other Topics Concern   Not on file  Social History Narrative   Not on file   Social Determinants of Health   Financial Resource Strain: Not on file  Food Insecurity: Not on file  Transportation Needs: Not on file  Physical Activity: Not on file  Stress: Not on file  Social Connections: Not on file  Intimate Partner Violence: Not on file    Ms. Callender's family history includes Colon cancer in her paternal grandfather; Hypertension in her father and mother; Osteoarthritis in her mother; Pulmonary embolism in her mother; Stroke in her father and another family member.      Objective:    Vitals:   08/15/22 1411  BP: (!) 142/80  Pulse: 63    Physical Exam Well-developed well-nourished elderly WF  in no acute distress.  Height, Weight,169  BMI29.9  HEENT; nontraumatic normocephalic, EOMI, PE R LA, sclera anicteric. Oropharynx;n ot examined today  Neck; supple, no JVD Cardiovascular; regular rate and rhythm with S1-S2, no murmur rub or gallop Pulmonary; Clear bilaterally Abdomen; soft, upper abdominal midline ventral hernias, reducible , no focal tenderness ,nondistended, no palpable mass or hepatosplenomegaly, bowel sounds are active Rectal; not done today Skin; benign exam, no jaundice rash or appreciable lesions Extremities; no  clubbing cyanosis or edema skin warm and dry Neuro/Psych; alert and oriented x4, grossly nonfocal mood and affect appropriate        Assessment & Plan:   #78 76 year old white female, status post right hemicolectomy 2019 with ileostomy then reversal for severe ischemic colitis, with component of chronic diarrhea, which had been managed with Lomotil and Questran in the past.  She has had worse diarrhea over the past couple of  months up to 8-9 bowel movements per day. She has had 2 recent courses of antibiotics for urinary tract infections currently finishing the second course She has not been taking the Questran regularly recently  Need to rule out C. difficile, versus chronic diarrhea post right hemicolectomy  He also has prior diagnosis of pancreatic insufficiency and has been maintained on Zen Pep-  #2 no chronic pancreatitis by MRI 2022 #3 prior history of CVA and recent recurrent CVA July 2023 status post right carotid stenting #4 chronic antiplatelet therapy-on Plavix and aspirin #5 chronic kidney disease stage IV #6.  Nausea-multifactoral, patient did have significant gastritis on EGD January 2023 #7 ventral hernias-asymptomatic #8 coronary artery disease-multi vessel #9 adult onset diabetes mellitus #10.  Peripheral vascular disease status post SFA stent left #11 COPD no oxygen use  Plan; check stool for C. Difficile Advised  patient to restart Questran 4 g in 6 ounces of juice or water twice daily to be taken 2 hours away from meals and other meds  She can use Imodium up to 4 tablets daily Continue Zofran 4 mg every 6 hours as needed Continue Protonix 40 mg p.o. twice daily Continue Zenpep at current dose 40,000/1 26,002 p.o. with meals and 1 with snacks Try Balmex OTC for perianal irritation secondary to diarrhea  Further recommendations pending results of stool studies.        Dannell Gortney Genia Harold PA-C 08/15/2022   Cc: Donnajean Lopes, MD

## 2022-08-19 ENCOUNTER — Other Ambulatory Visit: Payer: Medicare HMO

## 2022-08-19 DIAGNOSIS — R110 Nausea: Secondary | ICD-10-CM

## 2022-08-19 DIAGNOSIS — R197 Diarrhea, unspecified: Secondary | ICD-10-CM

## 2022-08-19 DIAGNOSIS — K219 Gastro-esophageal reflux disease without esophagitis: Secondary | ICD-10-CM

## 2022-08-20 ENCOUNTER — Other Ambulatory Visit: Payer: Self-pay | Admitting: Internal Medicine

## 2022-08-20 DIAGNOSIS — Z1231 Encounter for screening mammogram for malignant neoplasm of breast: Secondary | ICD-10-CM

## 2022-08-20 LAB — CLOSTRIDIUM DIFFICILE TOXIN B, QUALITATIVE, REAL-TIME PCR: Toxigenic C. Difficile by PCR: NOT DETECTED

## 2022-08-21 DIAGNOSIS — H524 Presbyopia: Secondary | ICD-10-CM | POA: Diagnosis not present

## 2022-08-21 DIAGNOSIS — H52223 Regular astigmatism, bilateral: Secondary | ICD-10-CM | POA: Diagnosis not present

## 2022-08-29 ENCOUNTER — Telehealth: Payer: Self-pay | Admitting: Physician Assistant

## 2022-08-29 NOTE — Telephone Encounter (Signed)
Inbound call from patient requesting a call back to discuss results from labs done on 11/6. Please advise.

## 2022-08-30 NOTE — Telephone Encounter (Signed)
Patient seen for diarrhea and urgency on 08/15/22. Stool for C Diff was negative. Patient reports she continues to have multiple stools through the day that are soft unformed. She is taking Questran 1 packet in the mornings. Afebrile. No blood in the stools. No abdominal pain. She does have urgency and rectal tenesmus.

## 2022-09-02 NOTE — Telephone Encounter (Signed)
Left message on machine to call back  

## 2022-09-04 NOTE — Telephone Encounter (Signed)
Left message on machine to call back  

## 2022-09-17 NOTE — Telephone Encounter (Signed)
Called the patient. No answer. Left her a message to call back.

## 2022-10-21 ENCOUNTER — Ambulatory Visit
Admission: RE | Admit: 2022-10-21 | Discharge: 2022-10-21 | Disposition: A | Discharge status: Home / Self Care | Payer: Medicare Other | Source: Ambulatory Visit | Attending: Internal Medicine | Admitting: Internal Medicine

## 2022-10-21 DIAGNOSIS — Z1231 Encounter for screening mammogram for malignant neoplasm of breast: Secondary | ICD-10-CM

## 2022-10-24 ENCOUNTER — Telehealth: Payer: Self-pay

## 2022-10-24 ENCOUNTER — Other Ambulatory Visit: Payer: Self-pay

## 2022-10-24 MED ORDER — RIFAXIMIN 550 MG PO TABS: 550.0000 mg | ORAL_TABLET | Freq: Three times a day (TID) | ORAL | 0 refills | Status: AC

## 2022-10-24 NOTE — Telephone Encounter (Signed)
[facility]

## 2022-10-24 NOTE — Telephone Encounter (Signed)
Patient calls today, returning my call from November.   August 31, 2022 [name]  to Me     08/31/22  4:19 PM  Lets ask her to increase questran to twice daily - away from other meds- we can also give her a course of antibiotic for SIBO which she may have - Xifaxan 550 mg po TID x 14 days- if insurance will not cover or if we cannot get samples then Flagyl 250 mg po TID with food x 10 days Then let us know if not improving   She reports she does not like Questran. She does feel it was effective and therefore does not take it. She is taking Imodium 2 to 3 times a day for control of her diarrhea. She is willing to take antibiotics. Is it okay to go forward with that part of the plan?

## 2022-11-20 ENCOUNTER — Other Ambulatory Visit: Payer: Self-pay | Admitting: Vascular Surgery

## 2022-11-20 DIAGNOSIS — I6521 Occlusion and stenosis of right carotid artery: Secondary | ICD-10-CM

## 2022-11-20 DIAGNOSIS — I779 Disorder of arteries and arterioles, unspecified: Secondary | ICD-10-CM

## 2022-11-20 DIAGNOSIS — I6522 Occlusion and stenosis of left carotid artery: Secondary | ICD-10-CM

## 2022-12-04 ENCOUNTER — Other Ambulatory Visit: Payer: Self-pay

## 2022-12-04 MED ORDER — METRONIDAZOLE 250 MG PO TABS: 250.0000 mg | ORAL_TABLET | Freq: Three times a day (TID) | ORAL | 0 refills | Status: DC

## 2022-12-04 NOTE — Telephone Encounter (Signed)
Called the patient. No answer. Left her a message that a different prescription has been [facility] and she will take it for 10 days. Flagyl sent to her listed pharmacy. Left her a verbal warning to abstain from alcohol while on Flagyl.

## 2022-12-04 NOTE — Telephone Encounter (Signed)
Patient is calling to follow up on medication, states she was told it was expensive and was requesting an alternative. Please advise.

## 2022-12-12 DIAGNOSIS — N1832 Chronic kidney disease, stage 3b: Secondary | ICD-10-CM | POA: Diagnosis not present

## 2022-12-12 DIAGNOSIS — S92514A Nondisplaced fracture of proximal phalanx of right lesser toe(s), initial encounter for closed fracture: Secondary | ICD-10-CM | POA: Diagnosis not present

## 2022-12-12 DIAGNOSIS — E1151 Type 2 diabetes mellitus with diabetic peripheral angiopathy without gangrene: Secondary | ICD-10-CM | POA: Diagnosis not present

## 2022-12-12 DIAGNOSIS — E1122 Type 2 diabetes mellitus with diabetic chronic kidney disease: Secondary | ICD-10-CM | POA: Diagnosis not present

## 2022-12-12 DIAGNOSIS — I739 Peripheral vascular disease, unspecified: Secondary | ICD-10-CM | POA: Diagnosis not present

## 2022-12-12 DIAGNOSIS — R609 Edema, unspecified: Secondary | ICD-10-CM | POA: Diagnosis not present

## 2022-12-12 DIAGNOSIS — I129 Hypertensive chronic kidney disease with stage 1 through stage 4 chronic kidney disease, or unspecified chronic kidney disease: Secondary | ICD-10-CM | POA: Diagnosis not present

## 2022-12-12 DIAGNOSIS — E785 Hyperlipidemia, unspecified: Secondary | ICD-10-CM | POA: Diagnosis not present

## 2022-12-12 DIAGNOSIS — D509 Iron deficiency anemia, unspecified: Secondary | ICD-10-CM | POA: Diagnosis not present

## 2022-12-12 DIAGNOSIS — K219 Gastro-esophageal reflux disease without esophagitis: Secondary | ICD-10-CM | POA: Diagnosis not present

## 2022-12-12 DIAGNOSIS — M792 Neuralgia and neuritis, unspecified: Secondary | ICD-10-CM | POA: Diagnosis not present

## 2022-12-19 ENCOUNTER — Telehealth: Payer: Self-pay | Admitting: Physician Assistant

## 2022-12-19 NOTE — Telephone Encounter (Signed)
Left message on machine to call back  

## 2022-12-19 NOTE — Telephone Encounter (Signed)
Patient is calling states she has been taking the Flagyl and it's been helping but she is wondering where to go from here. Please advise

## 2022-12-24 NOTE — Telephone Encounter (Signed)
Called the patient. No answer. Left her a message on her voicemail. If she has finished the Flagyl and has no further problems with diarrhea, that is good. Explained to avoid or reduce dairy products and avoid corn syrup containing foods and beverages. Call us if her symptoms return or she has other questions or concerns. Ask for Beth.

## 2022-12-24 NOTE — Telephone Encounter (Signed)
Line rings and turns to busy.

## 2022-12-24 NOTE — Telephone Encounter (Signed)
Patient is returning your call wanted to speak with you.

## 2022-12-30 ENCOUNTER — Telehealth: Payer: Self-pay | Admitting: Physician Assistant

## 2022-12-30 NOTE — Telephone Encounter (Signed)
Inbound call from patient requesting to speak with a nurse in regards triamterene- . Patient stated she tried it before and it didn't work for her she wan to try something else .Please advise

## 2022-12-30 NOTE — Telephone Encounter (Signed)
Called the patient. Voicemail. Left her a message that after the medication, there was not anything that needed to be done. If she is having any GI concerns or issues, call us back.

## 2022-12-31 NOTE — Telephone Encounter (Signed)
Called patient. Left her a message of my return call .

## 2023-01-02 ENCOUNTER — Observation Stay (HOSPITAL_COMMUNITY): Payer: Medicare HMO

## 2023-01-02 ENCOUNTER — Other Ambulatory Visit: Payer: Self-pay

## 2023-01-02 ENCOUNTER — Emergency Department (HOSPITAL_COMMUNITY): Payer: Medicare HMO

## 2023-01-02 ENCOUNTER — Inpatient Hospital Stay (HOSPITAL_COMMUNITY)
Admission: EM | Admit: 2023-01-02 | Discharge: 2023-01-04 | DRG: 069 | Disposition: A | Discharge status: Home / Self Care | Payer: Medicare HMO | Attending: Internal Medicine | Admitting: Internal Medicine

## 2023-01-02 ENCOUNTER — Encounter (HOSPITAL_COMMUNITY): Payer: Self-pay

## 2023-01-02 DIAGNOSIS — E1122 Type 2 diabetes mellitus with diabetic chronic kidney disease: Secondary | ICD-10-CM | POA: Diagnosis present

## 2023-01-02 DIAGNOSIS — E669 Obesity, unspecified: Secondary | ICD-10-CM | POA: Diagnosis not present

## 2023-01-02 DIAGNOSIS — Z91041 Radiographic dye allergy status: Secondary | ICD-10-CM | POA: Diagnosis not present

## 2023-01-02 DIAGNOSIS — I639 Cerebral infarction, unspecified: Secondary | ICD-10-CM | POA: Diagnosis not present

## 2023-01-02 DIAGNOSIS — I6523 Occlusion and stenosis of bilateral carotid arteries: Secondary | ICD-10-CM | POA: Diagnosis not present

## 2023-01-02 DIAGNOSIS — Z882 Allergy status to sulfonamides status: Secondary | ICD-10-CM | POA: Diagnosis not present

## 2023-01-02 DIAGNOSIS — Z86718 Personal history of other venous thrombosis and embolism: Secondary | ICD-10-CM

## 2023-01-02 DIAGNOSIS — Z6833 Body mass index (BMI) 33.0-33.9, adult: Secondary | ICD-10-CM

## 2023-01-02 DIAGNOSIS — Z8261 Family history of arthritis: Secondary | ICD-10-CM

## 2023-01-02 DIAGNOSIS — I2510 Atherosclerotic heart disease of native coronary artery without angina pectoris: Secondary | ICD-10-CM | POA: Diagnosis present

## 2023-01-02 DIAGNOSIS — Z91048 Other nonmedicinal substance allergy status: Secondary | ICD-10-CM

## 2023-01-02 DIAGNOSIS — Z832 Family history of diseases of the blood and blood-forming organs and certain disorders involving the immune mechanism: Secondary | ICD-10-CM

## 2023-01-02 DIAGNOSIS — Z8673 Personal history of transient ischemic attack (TIA), and cerebral infarction without residual deficits: Secondary | ICD-10-CM

## 2023-01-02 DIAGNOSIS — G4733 Obstructive sleep apnea (adult) (pediatric): Secondary | ICD-10-CM | POA: Diagnosis present

## 2023-01-02 DIAGNOSIS — I700 Atherosclerosis of aorta: Secondary | ICD-10-CM | POA: Diagnosis not present

## 2023-01-02 DIAGNOSIS — R29818 Other symptoms and signs involving the nervous system: Secondary | ICD-10-CM | POA: Diagnosis not present

## 2023-01-02 DIAGNOSIS — K861 Other chronic pancreatitis: Secondary | ICD-10-CM | POA: Diagnosis present

## 2023-01-02 DIAGNOSIS — I5022 Chronic systolic (congestive) heart failure: Secondary | ICD-10-CM | POA: Diagnosis not present

## 2023-01-02 DIAGNOSIS — I63232 Cerebral infarction due to unspecified occlusion or stenosis of left carotid arteries: Secondary | ICD-10-CM | POA: Diagnosis present

## 2023-01-02 DIAGNOSIS — E785 Hyperlipidemia, unspecified: Secondary | ICD-10-CM | POA: Diagnosis present

## 2023-01-02 DIAGNOSIS — R109 Unspecified abdominal pain: Secondary | ICD-10-CM | POA: Diagnosis not present

## 2023-01-02 DIAGNOSIS — Z7982 Long term (current) use of aspirin: Secondary | ICD-10-CM

## 2023-01-02 DIAGNOSIS — K760 Fatty (change of) liver, not elsewhere classified: Secondary | ICD-10-CM | POA: Diagnosis present

## 2023-01-02 DIAGNOSIS — Z888 Allergy status to other drugs, medicaments and biological substances status: Secondary | ICD-10-CM | POA: Diagnosis not present

## 2023-01-02 DIAGNOSIS — Z885 Allergy status to narcotic agent status: Secondary | ICD-10-CM

## 2023-01-02 DIAGNOSIS — J4489 Other specified chronic obstructive pulmonary disease: Secondary | ICD-10-CM | POA: Diagnosis present

## 2023-01-02 DIAGNOSIS — I10 Essential (primary) hypertension: Secondary | ICD-10-CM | POA: Diagnosis not present

## 2023-01-02 DIAGNOSIS — J4520 Mild intermittent asthma, uncomplicated: Secondary | ICD-10-CM | POA: Diagnosis not present

## 2023-01-02 DIAGNOSIS — Z87891 Personal history of nicotine dependence: Secondary | ICD-10-CM

## 2023-01-02 DIAGNOSIS — R531 Weakness: Secondary | ICD-10-CM | POA: Diagnosis not present

## 2023-01-02 DIAGNOSIS — G459 Transient cerebral ischemic attack, unspecified: Principal | ICD-10-CM | POA: Diagnosis present

## 2023-01-02 DIAGNOSIS — K219 Gastro-esophageal reflux disease without esophagitis: Secondary | ICD-10-CM | POA: Diagnosis present

## 2023-01-02 DIAGNOSIS — G40909 Epilepsy, unspecified, not intractable, without status epilepticus: Secondary | ICD-10-CM | POA: Diagnosis not present

## 2023-01-02 DIAGNOSIS — E1151 Type 2 diabetes mellitus with diabetic peripheral angiopathy without gangrene: Secondary | ICD-10-CM | POA: Diagnosis present

## 2023-01-02 DIAGNOSIS — R55 Syncope and collapse: Secondary | ICD-10-CM

## 2023-01-02 DIAGNOSIS — I130 Hypertensive heart and chronic kidney disease with heart failure and stage 1 through stage 4 chronic kidney disease, or unspecified chronic kidney disease: Secondary | ICD-10-CM | POA: Diagnosis present

## 2023-01-02 DIAGNOSIS — Z7902 Long term (current) use of antithrombotics/antiplatelets: Secondary | ICD-10-CM

## 2023-01-02 DIAGNOSIS — Z91040 Latex allergy status: Secondary | ICD-10-CM | POA: Diagnosis not present

## 2023-01-02 DIAGNOSIS — R42 Dizziness and giddiness: Secondary | ICD-10-CM | POA: Diagnosis not present

## 2023-01-02 DIAGNOSIS — R202 Paresthesia of skin: Secondary | ICD-10-CM | POA: Diagnosis not present

## 2023-01-02 DIAGNOSIS — N184 Chronic kidney disease, stage 4 (severe): Secondary | ICD-10-CM | POA: Diagnosis not present

## 2023-01-02 DIAGNOSIS — Z823 Family history of stroke: Secondary | ICD-10-CM

## 2023-01-02 DIAGNOSIS — Z800 Family history of malignant neoplasm of digestive organs: Secondary | ICD-10-CM

## 2023-01-02 DIAGNOSIS — Z9049 Acquired absence of other specified parts of digestive tract: Secondary | ICD-10-CM

## 2023-01-02 DIAGNOSIS — Z8249 Family history of ischemic heart disease and other diseases of the circulatory system: Secondary | ICD-10-CM

## 2023-01-02 DIAGNOSIS — Z79899 Other long term (current) drug therapy: Secondary | ICD-10-CM

## 2023-01-02 LAB — COMPREHENSIVE METABOLIC PANEL
ALT: 15 U/L (ref 0–44)
AST: 22 U/L (ref 15–41)
Albumin: 3.2 g/dL — ABNORMAL LOW (ref 3.5–5.0)
Alkaline Phosphatase: 80 U/L (ref 38–126)
Anion gap: 9 (ref 5–15)
BUN: 21 mg/dL (ref 8–23)
CO2: 21 mmol/L — ABNORMAL LOW (ref 22–32)
Calcium: 8.5 mg/dL — ABNORMAL LOW (ref 8.9–10.3)
Chloride: 105 mmol/L (ref 98–111)
Creatinine, Ser: 1.88 mg/dL — ABNORMAL HIGH (ref 0.44–1.00)
GFR, Estimated: 28 mL/min — ABNORMAL LOW (ref >60)
Glucose, Bld: 107 mg/dL — ABNORMAL HIGH (ref 70–99)
Potassium: 4.6 mmol/L (ref 3.5–5.1)
Sodium: 135 mmol/L (ref 135–145)
Total Bilirubin: 0.5 mg/dL (ref 0.3–1.2)
Total Protein: 6 g/dL — ABNORMAL LOW (ref 6.5–8.1)

## 2023-01-02 LAB — CBC WITH DIFFERENTIAL/PLATELET
Abs Immature Granulocytes: 0.02 10*3/uL (ref 0.00–0.07)
Basophils Absolute: 0.1 10*3/uL (ref 0.0–0.1)
Basophils Relative: 1 %
Eosinophils Absolute: 0.5 10*3/uL (ref 0.0–0.5)
Eosinophils Relative: 6 %
HCT: 37.9 % (ref 36.0–46.0)
Hemoglobin: 11.9 g/dL — ABNORMAL LOW (ref 12.0–15.0)
Immature Granulocytes: 0 %
Lymphocytes Relative: 32 %
Lymphs Abs: 2.6 10*3/uL (ref 0.7–4.0)
MCH: 30.4 pg (ref 26.0–34.0)
MCHC: 31.4 g/dL (ref 30.0–36.0)
MCV: 96.7 fL (ref 80.0–100.0)
Monocytes Absolute: 0.8 10*3/uL (ref 0.1–1.0)
Monocytes Relative: 10 %
Neutro Abs: 4.2 10*3/uL (ref 1.7–7.7)
Neutrophils Relative %: 51 %
Platelets: 214 10*3/uL (ref 150–400)
RBC: 3.92 MIL/uL (ref 3.87–5.11)
RDW: 13.2 % (ref 11.5–15.5)
WBC: 8.2 10*3/uL (ref 4.0–10.5)
nRBC: 0 % (ref 0.0–0.2)

## 2023-01-02 LAB — URINALYSIS, ROUTINE W REFLEX MICROSCOPIC
Bilirubin Urine: NEGATIVE
Glucose, UA: NEGATIVE mg/dL
Hgb urine dipstick: NEGATIVE
Ketones, ur: NEGATIVE mg/dL
Leukocytes,Ua: NEGATIVE
Nitrite: NEGATIVE
Protein, ur: NEGATIVE mg/dL
Specific Gravity, Urine: 1.004 — ABNORMAL LOW (ref 1.005–1.030)
pH: 5 (ref 5.0–8.0)

## 2023-01-02 LAB — PROTIME-INR
INR: 1.1 (ref 0.8–1.2)
Prothrombin Time: 14.2 seconds (ref 11.4–15.2)

## 2023-01-02 LAB — TROPONIN I (HIGH SENSITIVITY)
Troponin I (High Sensitivity): 13 ng/L (ref <18)
Troponin I (High Sensitivity): 11 ng/L (ref <18)

## 2023-01-02 LAB — LIPASE, BLOOD: Lipase: 21 U/L (ref 11–51)

## 2023-01-02 LAB — LACTIC ACID, PLASMA: Lactic Acid, Venous: 1.1 mmol/L (ref 0.5–1.9)

## 2023-01-02 MED ORDER — HYDRALAZINE HCL 20 MG/ML IJ SOLN
5.0000 mg | Freq: Four times a day (QID) | INTRAMUSCULAR | Status: DC | PRN
  Administered 2023-01-02: 5 mg via INTRAVENOUS

## 2023-01-02 MED ORDER — HYDRALAZINE HCL 50 MG PO TABS
100.0000 mg | ORAL_TABLET | Freq: Three times a day (TID) | ORAL | Status: DC
  Administered 2023-01-03 – 2023-01-04 (×5): 100 mg via ORAL
  Filled 2023-01-02 (×5): qty 2

## 2023-01-02 MED ORDER — HYDROCODONE-ACETAMINOPHEN 10-325 MG PO TABS
1.0000 | ORAL_TABLET | Freq: Four times a day (QID) | ORAL | Status: DC | PRN
  Administered 2023-01-03: 1 via ORAL
  Filled 2023-01-02: qty 1

## 2023-01-02 MED ORDER — ACETAMINOPHEN 325 MG PO TABS
650.0000 mg | ORAL_TABLET | ORAL | Status: DC | PRN
  Administered 2023-01-02 – 2023-01-03 (×4): 650 mg via ORAL
  Filled 2023-01-02 (×3): qty 2

## 2023-01-02 MED ORDER — STROKE: EARLY STAGES OF RECOVERY BOOK
Freq: Once | Status: AC
  Filled 2023-01-02 (×2): qty 1

## 2023-01-02 MED ORDER — ACETAMINOPHEN 650 MG RE SUPP: 650.0000 mg | RECTAL | Status: DC | PRN

## 2023-01-02 MED ORDER — ACETAMINOPHEN 160 MG/5ML PO SOLN: 650.0000 mg | ORAL | Status: DC | PRN

## 2023-01-02 MED ORDER — DIPHENHYDRAMINE HCL 25 MG PO CAPS: 50.0000 mg | ORAL_CAPSULE | Freq: Once | ORAL | Status: DC

## 2023-01-02 MED ORDER — LOSARTAN POTASSIUM 50 MG PO TABS
100.0000 mg | ORAL_TABLET | Freq: Every day | ORAL | Status: DC
  Administered 2023-01-03 – 2023-01-04 (×2): 100 mg via ORAL
  Filled 2023-01-02 (×2): qty 2

## 2023-01-02 MED ORDER — ATORVASTATIN CALCIUM 40 MG PO TABS
40.0000 mg | ORAL_TABLET | Freq: Every day | ORAL | Status: DC
  Administered 2023-01-03 – 2023-01-04 (×2): 40 mg via ORAL
  Filled 2023-01-02 (×2): qty 1

## 2023-01-02 MED ORDER — ONDANSETRON HCL 4 MG PO TABS
4.0000 mg | ORAL_TABLET | Freq: Four times a day (QID) | ORAL | Status: DC | PRN
  Administered 2023-01-03 (×2): 4 mg via ORAL
  Filled 2023-01-02 (×2): qty 1

## 2023-01-02 MED ORDER — LEVETIRACETAM 500 MG PO TABS
500.0000 mg | ORAL_TABLET | Freq: Two times a day (BID) | ORAL | Status: DC
  Administered 2023-01-02 – 2023-01-04 (×4): 500 mg via ORAL
  Filled 2023-01-02 (×4): qty 1

## 2023-01-02 MED ORDER — GADOBUTROL 1 MMOL/ML IV SOLN
7.5000 mL | Freq: Once | INTRAVENOUS | Status: AC | PRN
  Administered 2023-01-02: 7.5 mL via INTRAVENOUS

## 2023-01-02 MED ORDER — HEPARIN SODIUM (PORCINE) 5000 UNIT/ML IJ SOLN
5000.0000 [IU] | Freq: Two times a day (BID) | INTRAMUSCULAR | Status: DC
  Administered 2023-01-02 – 2023-01-04 (×4): 5000 [IU] via SUBCUTANEOUS
  Filled 2023-01-02 (×4): qty 1

## 2023-01-02 MED ORDER — METHYLPREDNISOLONE SODIUM SUCC 40 MG IJ SOLR: 40.0000 mg | Freq: Once | INTRAMUSCULAR | Status: DC

## 2023-01-02 MED ORDER — ALBUTEROL SULFATE (2.5 MG/3ML) 0.083% IN NEBU: 2.5000 mg | INHALATION_SOLUTION | Freq: Four times a day (QID) | RESPIRATORY_TRACT | Status: DC | PRN

## 2023-01-02 MED ORDER — POLYSACCHARIDE-IRON COMPLEX 150 MG PO CAPS: 1.0000 | ORAL_CAPSULE | Freq: Every day | ORAL | Status: DC

## 2023-01-02 MED ORDER — POLYSACCHARIDE IRON COMPLEX 150 MG PO CAPS
150.0000 mg | ORAL_CAPSULE | Freq: Every day | ORAL | Status: DC
  Administered 2023-01-03 – 2023-01-04 (×2): 150 mg via ORAL
  Filled 2023-01-02 (×2): qty 1

## 2023-01-02 MED ORDER — FUROSEMIDE 20 MG PO TABS
20.0000 mg | ORAL_TABLET | Freq: Every day | ORAL | Status: DC
  Administered 2023-01-03 – 2023-01-04 (×2): 20 mg via ORAL
  Filled 2023-01-02 (×2): qty 1

## 2023-01-02 MED ORDER — MIRTAZAPINE 15 MG PO TABS
15.0000 mg | ORAL_TABLET | Freq: Every day | ORAL | Status: DC
  Administered 2023-01-02 – 2023-01-03 (×2): 15 mg via ORAL
  Filled 2023-01-02 (×2): qty 1

## 2023-01-02 MED ORDER — HYDRALAZINE HCL 20 MG/ML IJ SOLN
10.0000 mg | INTRAMUSCULAR | Status: DC | PRN
  Administered 2023-01-03: 10 mg via INTRAVENOUS
  Filled 2023-01-02: qty 1

## 2023-01-02 MED ORDER — LACTATED RINGERS IV BOLUS
1000.0000 mL | Freq: Once | INTRAVENOUS | Status: AC
  Administered 2023-01-02: 1000 mL via INTRAVENOUS

## 2023-01-02 MED ORDER — HYDRALAZINE HCL 20 MG/ML IJ SOLN
10.0000 mg | Freq: Once | INTRAMUSCULAR | Status: AC
  Administered 2023-01-02: 10 mg via INTRAVENOUS
  Filled 2023-01-02: qty 1

## 2023-01-02 MED ORDER — PANTOPRAZOLE SODIUM 40 MG PO TBEC
40.0000 mg | DELAYED_RELEASE_TABLET | Freq: Two times a day (BID) | ORAL | Status: DC
  Administered 2023-01-02 – 2023-01-04 (×4): 40 mg via ORAL
  Filled 2023-01-02 (×4): qty 1

## 2023-01-02 MED ORDER — FLUTICASONE PROPIONATE 50 MCG/ACT NA SUSP: 2.0000 | Freq: Every day | NASAL | Status: DC | PRN

## 2023-01-02 MED ORDER — SENNOSIDES-DOCUSATE SODIUM 8.6-50 MG PO TABS: 1.0000 | ORAL_TABLET | Freq: Every evening | ORAL | Status: DC | PRN

## 2023-01-02 MED ORDER — ACETAMINOPHEN 325 MG PO TABS: 650.0000 mg | ORAL_TABLET | Freq: Four times a day (QID) | ORAL | Status: DC | PRN

## 2023-01-02 MED ORDER — VENLAFAXINE HCL ER 75 MG PO CP24
75.0000 mg | ORAL_CAPSULE | Freq: Every day | ORAL | Status: DC
  Administered 2023-01-03 – 2023-01-04 (×2): 75 mg via ORAL
  Filled 2023-01-02 (×2): qty 1

## 2023-01-02 MED ORDER — METOPROLOL SUCCINATE ER 25 MG PO TB24
100.0000 mg | ORAL_TABLET | Freq: Every day | ORAL | Status: DC
  Administered 2023-01-03 – 2023-01-04 (×2): 100 mg via ORAL
  Filled 2023-01-02 (×2): qty 4

## 2023-01-02 MED ORDER — ASPIRIN 81 MG PO CHEW
81.0000 mg | CHEWABLE_TABLET | Freq: Every day | ORAL | Status: DC
  Administered 2023-01-03 – 2023-01-04 (×2): 81 mg via ORAL
  Filled 2023-01-02 (×2): qty 1

## 2023-01-02 MED ORDER — MONTELUKAST SODIUM 10 MG PO TABS
10.0000 mg | ORAL_TABLET | Freq: Every day | ORAL | Status: DC
  Administered 2023-01-03 – 2023-01-04 (×2): 10 mg via ORAL
  Filled 2023-01-02 (×2): qty 1

## 2023-01-02 MED ORDER — MECLIZINE HCL 12.5 MG PO TABS: 12.5000 mg | ORAL_TABLET | Freq: Three times a day (TID) | ORAL | Status: DC | PRN

## 2023-01-02 MED ORDER — DIPHENHYDRAMINE HCL 50 MG/ML IJ SOLN: 50.0000 mg | Freq: Once | INTRAMUSCULAR | Status: DC

## 2023-01-02 MED ORDER — CHOLESTYRAMINE 4 G PO PACK
0.5000 | PACK | Freq: Two times a day (BID) | ORAL | Status: DC
  Administered 2023-01-02 – 2023-01-03 (×2): 0.5 via ORAL
  Filled 2023-01-02 (×7): qty 1

## 2023-01-02 MED ORDER — AMLODIPINE BESYLATE 5 MG PO TABS
10.0000 mg | ORAL_TABLET | Freq: Every day | ORAL | Status: DC
  Administered 2023-01-03 – 2023-01-04 (×2): 10 mg via ORAL
  Filled 2023-01-02 (×2): qty 2

## 2023-01-02 MED ORDER — CLOPIDOGREL BISULFATE 75 MG PO TABS
75.0000 mg | ORAL_TABLET | Freq: Every day | ORAL | Status: DC
  Administered 2023-01-03 – 2023-01-04 (×2): 75 mg via ORAL
  Filled 2023-01-02 (×2): qty 1

## 2023-01-02 NOTE — TOC CAGE-AID Note (Signed)
Transition of Care [facility]) - CAGE-AID Screening   Patient Details  Name: [name] MRN: [id number] Date of Birth: [birth year]  [facility]) CM/SW Contact:    Army Melia, RN Phone Number:[phone] 01/02/2023, 9:43 PM   CAGE-AID Screening:    Have You Ever Felt You Ought to Cut Down on Your Drinking or Drug Use?: No Have People Annoyed You By Critizing Your Drinking Or Drug Use?: No Have You Felt Bad Or Guilty About Your Drinking Or Drug Use?: No Have You Ever Had a Drink or Used Drugs First Thing In The Morning to Steady Your Nerves or to Get Rid of a Hangover?: No CAGE-AID Score: 0  Substance Abuse Education Offered: No (no hx of drug/alcohol use, no resources indicated)

## 2023-01-02 NOTE — ED Notes (Signed)
Help place a external cath patiebnt is resting with call bell in reach

## 2023-01-02 NOTE — Progress Notes (Signed)
TRH night cross cover note:   I was notified by RN of the patient's most recent systolic blood pressures in the range of 208 to 215 mmHg no report of any associated acute symptoms with these blood pressures.  Associate with most recent rates in the low 90s.   Per my chart review, patient presents outside of the window for observance of permissive hypertension.  Admitting hospitalist has resumed her home antihypertensive medications and there is an existing order for hydralazine 5 mg IV every 6 hours as needed for systolic blood pressure greater than 150, which I modified to 10 mg every 4 hours and ordered an additional one-time dose of hydralazine 10 mg IV x 1 dose now.     Babs Bertin, DO Hospitalist

## 2023-01-02 NOTE — ED Notes (Signed)
Got patient cleaned up placed another brief helped patient up to the bedside toilet patient is now back in bed on the monitor with call bell in reach

## 2023-01-02 NOTE — H&P (Signed)
History and Physical    [name] FBP:[id number] DOB: 08/03/1946 DOA: 01/02/2023  PCP: Donnajean Lopes, MD (Confirm with patient/family/NH records and if not entered, this has to be entered at [facility] point of entry) Patient coming from: Home  I have personally briefly reviewed patient's old medical records in [place]  Chief Complaint: Feeling dizzy, belly hurts  HPI: [name] is a 77 y.o. female with medical history significant of CVA, right-sided carotid artery stenosis status post TCAR 2023, HTN, chronic HFrEF with LVEF 45-50% 2023, ischemic bowel status post partial colectomy/colostomy with subsequent reversal, seizure disorder, OSA on CPAP, IIDM, CKD stage IV, chronic pancreatitis, presented with worsening of frequent dizziness, and new onset of abdominal pain.  Her symptoms started 2 to 3 weeks ago with intermittent lightheadedness sometimes spinning sensation especially changing body positions, can happen 4-5 times a day.  And this week she has experienced more frequent episode affecting her life quality, as she is very nervous when she is standing up fear of the dizziness and falling down.  She denies any hearing changes no headache no double vision.  Today she also developed bilateral fingertips numbness but denies any weakness of any of the limbs.  In addition, she also experienced cramping-like epigastric abdominal pain this morning, associate with nausea but no vomiting, she thought that " hiatal hernia has come back".  She has history of colectomy/colostomy with reversal and has had chronic diarrhea which she reported as no change of frequency or texture.  Denies any tenesmus.  No fever or chills.   ED Course: Afebrile, blood pressure significant elevated 170/78.  WBC 8.2, hemoglobin 11.9, K4.6 creatinine 1.8 compared to baseline 1.7-1.8.  ED physician ordered MRI MRA and CT abd without contrast  Review of Systems: As per HPI otherwise 14 point review of systems  negative.    Past Medical History:  Diagnosis Date   Anxiety    Arthritis    back    Blind loop syndrome    Clotting disorder (HCC)    Hx Clot in leg per pt    Colostomy present [facility])    Coronary artery disease    a. known 60-70% mid-LAD stenosis with caths in 1999, 2002, 2006, and 2012 showing stable anatomy b. 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[place])    Diabetes mellitus    Diverticulosis    Dizziness    chronic   Dyslipidemia    Fatty liver    GERD (gastroesophageal reflux disease)    hiatial hernia   Heart murmur    Hiatal hernia    Hypertension    Irritable bowel syndrome    Pancreatitis, chronic ([place])    Peripheral vascular disease ([place]) 02/20/2010   s/p stent of left SFA; carotid stenosis   Seizure ([place]) 11/04/2018   last 1 yr 2020 per pt unsure month    Sleep apnea    no cpap    Stroke ([place])    Thyroid nodule    Vitamin B12 deficiency     Past Surgical History:  Procedure Laterality Date   ABDOMINAL HYSTERECTOMY     partial   AUGMENTATION MAMMAPLASTY     bilateral 1976 retro    BREAST ENHANCEMENT SURGERY     CARDIAC CATHETERIZATION  07/07/98;08/31/01;03/04/05   60 -70% LAD   CATARACT EXTRACTION Bilateral    COLON RESECTION N/A 10/14/2017   Procedure: EXPLORATORY LAPAROTOMY, EXTENDED RIGHT COLECTOMY; APPLICATION OF ABDOMINAL VACUUM DRESSING;  Surgeon:  Jovita Kussmaul, MD;  Location: [facility];  Service: General;  Laterality: N/A;   COLONOSCOPY     ENDOBRONCHIAL ULTRASOUND Bilateral 02/24/2017   Procedure: ENDOBRONCHIAL ULTRASOUND;  Surgeon: Collene Gobble, MD;  Location: [facility];  Service: Cardiopulmonary;  Laterality: Bilateral;   FEMORAL ARTERY STENT     Left leg   LAPAROTOMY N/A 10/16/2017   Procedure: EXPLORATORY LAPAROTOMY, PARTIAL OMENTECTOMY, RESECTION ISCHEMIC ILEUM [id number], APPLICATION OF VAC ABDOMINAL DRESSING;  Surgeon: Armandina Gemma, MD;  Location: [facility];  Service: General;  Laterality: N/A;   LAPAROTOMY N/A  10/18/2017   Procedure: RE-EXPLORATION OF ABDOMEN, ILEOSTOMY CREATION;  Surgeon: Stark Klein, MD;  Location: [facility];  Service: General;  Laterality: N/A;   LUNG SURGERY  03/2017   Benign polyps removed   PATELLA REALIGNMENT Left    POLYPECTOMY     SIGMOIDOSCOPY     TONSILLECTOMY     TRANSCAROTID ARTERY REVASCULARIZATION  Right 04/18/2022   Procedure: Transcarotid Artery Revascularization;  Surgeon: [name], MD;  Location: [facility];  Service: Vascular;  Laterality: Right;     reports that she quit smoking about 12 years ago. Her smoking use included cigarettes. She has a 50.00 pack-year smoking history. She has never used smokeless tobacco. She reports that she does not drink alcohol and does not use drugs.  Allergies  Allergen Reactions   Adhesive [Tape] Other (See Comments)    "Took off my skin"   Other Rash    "Took off my skin"   Ace Inhibitors Other (See Comments)    Unknown   Latex Other (See Comments)   Sulfa Antibiotics Other (See Comments)    Unknown   Topiramate Other (See Comments)   Codeine Other (See Comments)    Altered mental state    Iodinated Contrast Media Rash    Family History  Problem Relation Age of Onset   Hypertension Mother    Osteoarthritis Mother    Pulmonary embolism Mother    Hypertension Father        aneurysm   Stroke Father    Colon cancer Paternal Grandfather    Stroke Other    Breast cancer Neg Hx    Colon polyps Neg Hx    Esophageal cancer Neg Hx    Rectal cancer Neg Hx    Stomach cancer Neg Hx    Pancreatic cancer Neg Hx    Liver disease Neg Hx      Prior to Admission medications   Medication Sig Start Date End Date Taking? Authorizing Provider  acetaminophen (TYLENOL) 325 MG tablet Take 650 mg by mouth every 6 (six) hours as needed for mild pain.    [provider]  albuterol (VENTOLIN HFA) 108 (90 Base) MCG/ACT inhaler Inhale 1-2 puffs into the lungs every 6 (six) hours as needed for wheezing or shortness of  breath. 03/30/21   [provider]  amLODipine (NORVASC) 10 MG tablet Take 10 mg by mouth daily.    [provider]  aspirin 81 MG chewable tablet Chew 1 tablet (81 mg total) by mouth daily. 04/19/22   Patrecia Pour, MD  atorvastatin (LIPITOR) 40 MG tablet Take 1 tablet (40 mg total) by mouth daily. 04/19/22   Patrecia Pour, MD  cholestyramine Lucrezia Starch) 4 g packet Take 0.5 packets by mouth in the morning and at bedtime. 03/30/22   [provider]  clopidogrel (PLAVIX) 75 MG tablet Take 1 tablet (75 mg total) by mouth daily. 04/19/22   Vance Gather  B, MD  [employer] DIGESTIVE PROBIOTIC 250 MG capsule Take 500 mg by mouth daily. 06/27/21   [provider]  fluticasone (FLONASE) 50 MCG/ACT nasal spray Place 2 sprays into both nostrils daily as needed for allergies. 01/29/16   [provider]  furosemide (LASIX) 20 MG tablet Take 20 mg by mouth daily. 08/06/21   [provider]  hydrALAZINE (APRESOLINE) 100 MG tablet Take 1 tablet (100 mg total) by mouth every 8 (eight) hours. 04/09/22   Thurnell Lose, MD  HYDROcodone-acetaminophen (NORCO) 10-325 MG tablet Take 1 tablet by mouth 4 (four) times daily as needed for moderate pain. 04/01/22   [provider]  levETIRAcetam (KEPPRA) 500 MG tablet TAKE 1 TABLET BY MOUTH TWICE A DAY Patient taking differently: Take 500 mg by mouth 2 (two) times daily. 01/20/20   Dohmeier, Asencion Partridge, MD  losartan (COZAAR) 100 MG tablet Take 1 tablet (100 mg total) by mouth daily. Patient taking differently: Take 100 mg by mouth daily. Total of 150mg  12/28/21   Barb Merino, MD  losartan (COZAAR) 50 MG tablet Take 50 mg by mouth daily. Total of 150 mg 03/05/22   [provider]  magnesium oxide (MAG-OX) 400 MG tablet Take 1 tablet by mouth daily. 05/08/20   [provider]  metoprolol succinate (TOPROL-XL) 100 MG 24 hr tablet Take 100 mg by mouth daily. 12/20/21   [provider]  metroNIDAZOLE (FLAGYL) 250 MG  tablet Take 1 tablet (250 mg total) by mouth 3 (three) times daily. 12/04/22   Esterwood, Amy S, PA-C  mirtazapine (REMERON) 15 MG tablet Take 15 mg by mouth at bedtime. 07/18/21   [provider]  montelukast (SINGULAIR) 10 MG tablet Take 10 mg by mouth daily. 08/09/18   [provider]  nitroGLYCERIN (NITROSTAT) 0.4 MG SL tablet Place 1 tablet (0.4 mg total) under the tongue every 5 (five) minutes as needed for chest pain. 11/08/15   Martinique, Peter M, MD  ondansetron (ZOFRAN) 4 MG tablet Take 1 tablet (4 mg total) by mouth every 6 (six) hours as needed for nausea or vomiting. 10/24/21   Esterwood, Amy S, PA-C  Pancrelipase, Lip-Prot-Amyl, (ZENPEP) 40000-126000 units CPEP Take 2 capsules with meals and one capsule with snacks. Patient taking differently: Take 1 capsule by mouth daily with breakfast. 06/08/21   Ladene Artist, MD  pantoprazole (PROTONIX) 40 MG tablet Take 1 tablet (40 mg total) by mouth 2 (two) times daily. 01/31/22   Ladene Artist, MD  Polysaccharide-Iron Complex 150 MG CAPS Take 1 tablet by mouth daily.    [provider]  triamterene-hydrochlorothiazide (MAXZIDE-25) 37.5-25 MG tablet Take 0.5 tablets by mouth every morning. 12/20/21   [provider]  venlafaxine XR (EFFEXOR-XR) 75 MG 24 hr capsule Take 1 capsule (75 mg total) by mouth daily. 05/18/20   [provider]  vitamin B-12 (CYANOCOBALAMIN) 500 MCG tablet Take 500 mcg by mouth daily.    [provider]  Vitamin D, Ergocalciferol, (DRISDOL) 1.25 MG (50000 UT) CAPS capsule Take 50,000 Units by mouth every Wednesday.     [provider]    Physical Exam: Vitals:   01/02/23 1030 01/02/23 1045 01/02/23 1100 01/02/23 1130  BP: (!) 172/60 (!) 163/69 (!) 174/68 (!) 178/76  Pulse: 62 (!) 59 60 67  Resp: 15 13 15  (!) 21  Temp:      TempSrc:      SpO2: 99% 98% 98% 100%  Weight:      Height:  Constitutional: NAD, calm, comfortable Vitals:   01/02/23 1030  01/02/23 1045 01/02/23 1100 01/02/23 1130  BP: (!) 172/60 (!) 163/69 (!) 174/68 (!) 178/76  Pulse: 62 (!) 59 60 67  Resp: 15 13 15  (!) 21  Temp:      TempSrc:      SpO2: 99% 98% 98% 100%  Weight:      Height:       Eyes: PERRL, lids and conjunctivae normal ENMT: Mucous membranes are moist. Posterior pharynx clear of any exudate or lesions.Normal dentition.  Neck: normal, supple, no masses, no thyromegaly Respiratory: clear to auscultation bilaterally, no wheezing, no crackles. Normal respiratory effort. No accessory muscle use.  Cardiovascular: Regular rate and rhythm, no murmurs / rubs / gallops. No extremity edema. 2+ pedal pulses. No carotid bruits.  Abdomen: mild tenderness on epigastric area, no rebound no guarding, no masses palpated. No hepatosplenomegaly. Bowel sounds positive.  Musculoskeletal: no clubbing / cyanosis. No joint deformity upper and lower extremities. Good ROM, no contractures. Normal muscle tone.  Skin: no rashes, lesions, ulcers. No induration Neurologic: CN 2-12 grossly intact. Sensation intact, DTR normal. Strength 5/5 in all 4. No nystagmus  Psychiatric: Normal judgment and insight. Alert and oriented x 3. Normal mood.     Labs on Admission: I have personally reviewed following labs and imaging studies  CBC: Recent Labs  Lab 01/02/23 1044  WBC 8.2  NEUTROABS 4.2  HGB 11.9*  HCT 37.9  MCV 96.7  PLT [id number]   Basic Metabolic Panel: Recent Labs  Lab 01/02/23 1044  NA 135  K 4.6  CL 105  CO2 21*  GLUCOSE 107*  BUN 21  CREATININE 1.88*  CALCIUM 8.5*   GFR: Estimated Creatinine Clearance: 25.4 mL/min (A) (by C-G formula based on SCr of 1.88 mg/dL (H)). Liver Function Tests: Recent Labs  Lab 01/02/23 1044  AST 22  ALT 15  ALKPHOS 80  BILITOT 0.5  PROT 6.0*  ALBUMIN 3.2*   Recent Labs  Lab 01/02/23 1044  LIPASE 21   No results for input(s): "AMMONIA" in the last 168 hours. Coagulation Profile: Recent Labs  Lab 01/02/23 1044  INR  1.1   Cardiac Enzymes: No results for input(s): "CKTOTAL", "CKMB", "CKMBINDEX", "TROPONINI" in the last 168 hours. BNP (last 3 results) No results for input(s): "PROBNP" in the last 8760 hours. HbA1C: No results for input(s): "HGBA1C" in the last 72 hours. CBG: No results for input(s): "GLUCAP" in the last 168 hours. Lipid Profile: No results for input(s): "CHOL", "HDL", "LDLCALC", "TRIG", "CHOLHDL", "LDLDIRECT" in the last 72 hours. Thyroid Function Tests: No results for input(s): "TSH", "T4TOTAL", "FREET4", "T3FREE", "THYROIDAB" in the last 72 hours. Anemia Panel: No results for input(s): "VITAMINB12", "FOLATE", "FERRITIN", "TIBC", "IRON", "RETICCTPCT" in the last 72 hours. Urine analysis:    Component Value Date/Time   COLORURINE STRAW (A) 01/02/2023 1303   APPEARANCEUR CLEAR 01/02/2023 1303   LABSPEC 1.004 (L) 01/02/2023 1303   PHURINE 5.0 01/02/2023 1303   GLUCOSEU NEGATIVE 01/02/2023 1303   HGBUR NEGATIVE 01/02/2023 1303   BILIRUBINUR NEGATIVE 01/02/2023 1303   KETONESUR NEGATIVE 01/02/2023 1303   PROTEINUR NEGATIVE 01/02/2023 1303   NITRITE NEGATIVE 01/02/2023 1303   LEUKOCYTESUR NEGATIVE 01/02/2023 1303    Radiological Exams on Admission: CT ABDOMEN PELVIS WO CONTRAST  Result Date: 01/02/2023 CLINICAL DATA:  Generalized weakness, abdominal pain EXAM: CT ABDOMEN AND PELVIS WITHOUT CONTRAST TECHNIQUE: Multidetector CT imaging of the abdomen and pelvis was performed following the standard protocol without IV contrast. Unenhanced  CT was performed per clinician order. Lack of IV contrast limits sensitivity and specificity, especially for evaluation of abdominal/pelvic solid viscera. RADIATION DOSE REDUCTION: This exam was performed according to the departmental dose-optimization program which includes automated exposure control, adjustment of the mA and/or kV according to patient size and/or use of iterative reconstruction technique. COMPARISON:  11/16/2020 FINDINGS: Lower  chest: No acute pleural or parenchymal lung disease. Hepatobiliary: Unremarkable unenhanced appearance of the liver and gallbladder. No biliary duct dilation. Pancreas: Unremarkable unenhanced appearance. Spleen: Unremarkable unenhanced appearance. Adrenals/Urinary Tract: The adrenals are unremarkable. No urinary tract calculi or obstructive uropathy within either kidney. Bladder is unremarkable. Stomach/Bowel: No bowel obstruction or ileus. Postsurgical changes from right hemicolectomy with reanastomosis in the lower central abdomen. No bowel wall thickening or inflammatory change. Small hiatal hernia. Vascular/Lymphatic: Aortic atherosclerosis. No enlarged abdominal or pelvic lymph nodes. Reproductive: Status post hysterectomy. No adnexal masses. Other: No free fluid or free intraperitoneal gas. Prior midline laparotomy, with 2 fat containing peri-incisional midline ventral hernias. No bowel herniation. Musculoskeletal: No acute or destructive bony lesions. Prominent spondylosis at the L2-3 level. Reconstructed images demonstrate no additional findings. IMPRESSION: 1. Unremarkable unenhanced CT of the abdomen and pelvis. No urinary tract calculi or obstructive uropathy. 2. Postsurgical changes from midline laparotomy, with fat containing peri-incisional ventral hernias. No bowel herniation. 3. Small hiatal hernia. 4.  Aortic Atherosclerosis (ICD10-I70.0). Electronically Signed   By: Randa Ngo M.D.   On: 01/02/2023 15:27   DG Chest 2 View  Result Date: 01/02/2023 CLINICAL DATA:  Denies weakness and intermittent dizziness for 3 weeks. EXAM: CHEST - 2 VIEW COMPARISON:  AP chest 04/08/2022 and 12/25/2021 FINDINGS: Cardiac silhouette and mediastinal contours are within limits. Mild-to-moderate calcification within aortic arch. Overlying breast tissue contributes to homogeneous density overlying the lower lungs. No focal airspace opacity. No pleural effusion artifacts. Moderate multilevel degenerative disc  changes of the thoracic spine. IMPRESSION: No active cardiopulmonary disease. Electronically Signed   By: Yvonne Kendall M.D.   On: 01/02/2023 12:43   CT Head Wo Contrast  Result Date: 01/02/2023 CLINICAL DATA:  Dizziness, difficulty with speech EXAM: CT HEAD WITHOUT CONTRAST TECHNIQUE: Contiguous axial images were obtained from the base of the skull through the vertex without intravenous contrast. RADIATION DOSE REDUCTION: This exam was performed according to the departmental dose-optimization program which includes automated exposure control, adjustment of the mA and/or kV according to patient size and/or use of iterative reconstruction technique. COMPARISON:  04/16/2022 FINDINGS: Brain: The brainstem, cerebellum, cerebral peduncles, thalami, basal ganglia, basilar cisterns, and ventricular system appear within normal limits. Periventricular white matter and corona radiata hypodensities favor chronic ischemic microvascular white matter disease. No intracranial hemorrhage, mass lesion, or acute CVA. Vascular: There is atherosclerotic calcification of the cavernous carotid arteries bilaterally. Skull: Unremarkable Sinuses/Orbits: Chronic bilateral maxillary and ethmoid sinusitis. Small left mastoid effusion Other: No supplemental non-categorized findings. IMPRESSION: 1. No acute intracranial findings. 2. Periventricular white matter and corona radiata hypodensities favor chronic ischemic microvascular white matter disease. 3. Atherosclerosis. 4. Chronic bilateral maxillary and ethmoid sinusitis. Small left mastoid effusion. Electronically Signed   By: Van Clines M.D.   On: 01/02/2023 12:07    EKG: Independently reviewed.  Sinus, chronic ST changes on V4-V6  Assessment/Plan Principal Problem:   TIA (transient ischemic attack) Active Problems:   CKD (chronic kidney disease) stage 4, GFR 15-29 ml/min (HCC)   Accelerated hypertension   Vertigo   HLD (hyperlipidemia)   Cerebrovascular accident  (CVA) due to occlusion of left carotid  artery (HCC)   CAD, multiple vessel  (please populate well all problems here in Problem List. (For example, if patient is on BP meds at home and you resume or decide to hold them, it is a problem that needs to be her. Same for CAD, COPD, HLD and so on)  Near syncope -Has vertigo feature, and symptoms has been persistent, rule out stroke -Agreed with brain MRI.  Given that the patient had recent right ICA intervention with stenting, MRA was ordered. -Continue aspirin and Plavix -Check orthostatic vital signs -Trial of meclizine -PT evaluation  Epigastric abdominal pain -CT abdominal pelvis without contrast pending -Abdominal exam benign, plan conservative management -Lipase negative, pancreatitis ruled out.  Other possible DDx,Hx of ischemic bowel and colectomy/colostomy with reversal, no significant changes of bowel movement patterns.  Low suspicion for ischemic bowel at this point.  Refractory HTN -Given her neurological symptoms more than 3 days, out of window for permissive hypertension, will resume all her home BP meds including amlodipine, hydralazine, losartan, metoprolol and Maxide. -As needed hydralazine  History of ischemic bowel with status post colectomy/colostomy with reversal -As discussed above, no change of bowel movement.  CT abdomen pelvis pending for evaluation of acute abdominal pain.  History of right ICA occlusion status post stenting -MRI pending, continue aspirin Plavix.  IIDM with diet control -Glucose level stable  CKD stage IV -Euvolemic, creatinine level stable  History of seizure disorder -Stable, continue Keppra  Chronic pancreatitis -Lipase negative, waiting for CT abdomen results  OSA -CPAP HS  Mild intermittent asthma -Stable, continue as needed albuterol    DVT prophylaxis: Heparin subcu Code Status: Full code Family Communication: None at bedside Disposition Plan: Expect less than 2 midnight  hospital stay Consults called: None Admission status: Tele obs   Lequita Halt MD [facility] Pager [phone]  01/02/2023, 3:58 PM

## 2023-01-02 NOTE — ED Notes (Signed)
ED TO INPATIENT HANDOFF REPORT  ED Nurse Name and Phone #: Raquel Sarna RN [phone]  S Name/Age/Gender [name] 77 y.o. female Room/Bed: RESUSC/RESUSC  Code Status   Code Status: Full Code  Home/SNF/Other Home Patient oriented to: self, place, time, and situation Is this baseline? Yes   Triage Complete: Triage complete  Chief Complaint TIA (transient ischemic attack) [G45.9]  Triage Note Pt arrived to ED via EMS from home w/ c/o generalized weakness and intermittent dizziness x 3 weeks. VSS w/ EMS. Pt in NAD.  EMS VS: HR 62, 94% RA, 174/80, CBG 134 20g L wrist   Allergies Allergies  Allergen Reactions   Adhesive [Tape] Other (See Comments)    "Took off my skin"   Other Rash    "Took off my skin"   Ace Inhibitors Other (See Comments)    Unknown   Latex Other (See Comments)   Sulfa Antibiotics Other (See Comments)    Unknown   Topiramate Other (See Comments)   Codeine Other (See Comments)    Altered mental state    Iodinated Contrast Media Rash    Level of Care/Admitting Diagnosis ED Disposition     ED Disposition  Admit   Condition  --   [place]: [place] [[id number]]  Level of Care: Telemetry Medical [104]  May place patient in observation at [facility] or [place] if equivalent level of care is available:: No  Covid Evaluation: Asymptomatic - no recent exposure (last 10 days) testing not required  Diagnosis: TIA (transient ischemic attack) [id number]  Admitting Physician: Lequita Halt [id number]  Attending Physician: Lequita Halt [id number]          B Medical/Surgery History Past Medical History:  Diagnosis Date   Anxiety    Arthritis    back    Blind loop syndrome    Clotting disorder (HCC)    Hx Clot in leg per pt    Colostomy present [facility])    Coronary artery disease    a. known 60-70% mid-LAD stenosis with caths in 1999, 2002, 2006, and 2012 showing stable anatomy b. low-risk NST in 10/2015    Depression    Diabetes insipidus ([place])    Diabetes mellitus    Diverticulosis    Dizziness    chronic   Dyslipidemia    Fatty liver    GERD (gastroesophageal reflux disease)    hiatial hernia   Heart murmur    Hiatal hernia    Hypertension    Irritable bowel syndrome    Pancreatitis, chronic ([place])    Peripheral vascular disease ([place]) 02/20/2010   s/p stent of left SFA; carotid stenosis   Seizure ([place]) 11/04/2018   last 1 yr 2020 per pt unsure month    Sleep apnea    no cpap    Stroke ([place])    Thyroid nodule    Vitamin B12 deficiency    Past Surgical History:  Procedure Laterality Date   ABDOMINAL HYSTERECTOMY     partial   AUGMENTATION MAMMAPLASTY     bilateral 1976 retro    BREAST ENHANCEMENT SURGERY     CARDIAC CATHETERIZATION  07/07/98;08/31/01;03/04/05   60 -70% LAD   CATARACT EXTRACTION Bilateral    COLON RESECTION N/A 10/14/2017   Procedure: EXPLORATORY LAPAROTOMY, EXTENDED RIGHT COLECTOMY; APPLICATION OF ABDOMINAL VACUUM DRESSING;  Surgeon: Jovita Kussmaul, MD;  Location: [facility];  Service: General;  Laterality: N/A;   COLONOSCOPY     ENDOBRONCHIAL ULTRASOUND Bilateral  02/24/2017   Procedure: ENDOBRONCHIAL ULTRASOUND;  Surgeon: Collene Gobble, MD;  Location: [facility];  Service: Cardiopulmonary;  Laterality: Bilateral;   FEMORAL ARTERY STENT     Left leg   LAPAROTOMY N/A 10/16/2017   Procedure: EXPLORATORY LAPAROTOMY, PARTIAL OMENTECTOMY, RESECTION ISCHEMIC ILEUM [id number], APPLICATION OF VAC ABDOMINAL DRESSING;  Surgeon: Armandina Gemma, MD;  Location: [facility];  Service: General;  Laterality: N/A;   LAPAROTOMY N/A 10/18/2017   Procedure: RE-EXPLORATION OF ABDOMEN, ILEOSTOMY CREATION;  Surgeon: Stark Klein, MD;  Location: [facility];  Service: General;  Laterality: N/A;   LUNG SURGERY  03/2017   Benign polyps removed   PATELLA REALIGNMENT Left    POLYPECTOMY     SIGMOIDOSCOPY     TONSILLECTOMY     TRANSCAROTID ARTERY REVASCULARIZATION  Right 04/18/2022   Procedure:  Transcarotid Artery Revascularization;  Surgeon: [name], MD;  Location: [facility];  Service: Vascular;  Laterality: Right;     A IV Location/Drains/Wounds Patient Lines/Drains/Airways Status     Active Line/Drains/Airways     Name Placement date Placement time Site Days   Peripheral IV 01/02/23 20 G Anterior;Distal;Left Forearm 01/02/23  1031  Forearm  less than 1   Negative Pressure Wound Therapy Abdomen Anterior 10/19/17  0830  --  1901   Ileostomy Standard (end) RLQ 10/18/17  1014  RLQ  1902   Pressure Injury 10/17/17 Stage II -  Partial thickness loss of dermis presenting as a shallow open ulcer with a red, pink wound bed without slough. deep purple with open blisters  10/17/17  2113  -- 1903   Pressure Injury 10/22/17 Unstageable - Full thickness tissue loss in which the base of the ulcer is covered by slough (yellow, tan, gray, green or brown) and/or eschar (tan, brown or black) in the wound bed. areas of black and white on tongue 10/22/17  0442  -- 1898   Pressure Injury 10/28/17 Stage II -  Partial thickness loss of dermis presenting as a shallow open ulcer with a red, pink wound bed without slough. 10/28/17  1600  -- 1892            Intake/Output Last 24 hours  Intake/Output Summary (Last 24 hours) at 01/02/2023 1515 Last data filed at 01/02/2023 1302 Gross per 24 hour  Intake 1000 ml  Output --  Net 1000 ml    Labs/Imaging Results for orders placed or performed during the hospital encounter of 01/02/23 (from the past 48 hour(s))  CBC with Differential     Status: Abnormal   Collection Time: 01/02/23 10:44 AM  Result Value Ref Range   WBC 8.2 4.0 - 10.5 K/uL   RBC 3.92 3.87 - 5.11 MIL/uL   Hemoglobin 11.9 (L) 12.0 - 15.0 g/dL   HCT 37.9 36.0 - 46.0 %   MCV 96.7 80.0 - 100.0 fL   MCH 30.4 26.0 - 34.0 pg   MCHC 31.4 30.0 - 36.0 g/dL   RDW 13.2 11.5 - 15.5 %   Platelets 214 150 - 400 K/uL   nRBC 0.0 0.0 - 0.2 %   Neutrophils Relative % 51 %   Neutro Abs 4.2  1.7 - 7.7 K/uL   Lymphocytes Relative 32 %   Lymphs Abs 2.6 0.7 - 4.0 K/uL   Monocytes Relative 10 %   Monocytes Absolute 0.8 0.1 - 1.0 K/uL   Eosinophils Relative 6 %   Eosinophils Absolute 0.5 0.0 - 0.5 K/uL   Basophils Relative 1 %   Basophils Absolute 0.1  0.0 - 0.1 K/uL   Immature Granulocytes 0 %   Abs Immature Granulocytes 0.02 0.00 - 0.07 K/uL    Comment: Performed at [facility], [place] [address], [place], [place] [address]  Comprehensive metabolic panel     Status: Abnormal   Collection Time: 01/02/23 10:44 AM  Result Value Ref Range   Sodium 135 135 - 145 mmol/L   Potassium 4.6 3.5 - 5.1 mmol/L   Chloride 105 98 - 111 mmol/L   CO2 21 (L) 22 - 32 mmol/L   Glucose, Bld 107 (H) 70 - 99 mg/dL    Comment: Glucose reference range applies only to samples taken after fasting for at least 8 hours.   BUN 21 8 - 23 mg/dL   Creatinine, Ser 1.88 (H) 0.44 - 1.00 mg/dL   Calcium 8.5 (L) 8.9 - 10.3 mg/dL   Total Protein 6.0 (L) 6.5 - 8.1 g/dL   Albumin 3.2 (L) 3.5 - 5.0 g/dL   AST 22 15 - 41 U/L   ALT 15 0 - 44 U/L   Alkaline Phosphatase 80 38 - 126 U/L   Total Bilirubin 0.5 0.3 - 1.2 mg/dL   GFR, Estimated 28 (L) >60 mL/min    Comment: (NOTE) Calculated using the CKD-EPI Creatinine Equation (2021)    Anion gap 9 5 - 15    Comment: Performed at [facility], [place] [address], [place], [place] [address]  Troponin I (High Sensitivity)     Status: None   Collection Time: 01/02/23 10:44 AM  Result Value Ref Range   Troponin I (High Sensitivity) 13 <18 ng/L    Comment: (NOTE) Elevated high sensitivity troponin I (hsTnI) values and significant  changes across serial measurements may suggest ACS but many other  chronic and acute conditions are known to elevate hsTnI results.  Refer to the "Links" section for chest pain algorithms and additional  guidance. Performed at [facility], [place] [address] [place], [place] [address]   Lipase, blood     Status: None    Collection Time: 01/02/23 10:44 AM  Result Value Ref Range   Lipase 21 11 - 51 U/L    Comment: Performed at [facility] [address], [place], [place] [address]  Protime-INR     Status: None   Collection Time: 01/02/23 10:44 AM  Result Value Ref Range   Prothrombin Time 14.2 11.4 - 15.2 seconds   INR 1.1 0.8 - 1.2    Comment: (NOTE) INR goal varies based on device and disease states. Performed at [facility], [place] [address] [place], [place] [address]   Lactic acid, plasma     Status: None   Collection Time: 01/02/23 10:45 AM  Result Value Ref Range   Lactic Acid, Venous 1.1 0.5 - 1.9 mmol/L    Comment: Performed at [facility] [address] [place],  [address]  Urinalysis, Routine w reflex microscopic -Urine, Clean Catch     Status: Abnormal   Collection Time: 01/02/23  1:03 PM  Result Value Ref Range   Color, Urine STRAW (A) YELLOW   APPearance CLEAR CLEAR   Specific Gravity, Urine 1.004 (L) 1.005 - 1.030   pH 5.0 5.0 - 8.0   Glucose, UA NEGATIVE NEGATIVE mg/dL   Hgb urine dipstick NEGATIVE NEGATIVE   Bilirubin Urine NEGATIVE NEGATIVE   Ketones, ur NEGATIVE NEGATIVE mg/dL   Protein, ur NEGATIVE NEGATIVE mg/dL   Nitrite NEGATIVE NEGATIVE  Leukocytes,Ua NEGATIVE NEGATIVE    Comment: Performed at [facility], [place] [address], [place], [place] [address]   DG Chest 2 View  Result Date: 01/02/2023 CLINICAL DATA:  Denies weakness and intermittent dizziness for 3 weeks. EXAM: CHEST - 2 VIEW COMPARISON:  AP chest 04/08/2022 and 12/25/2021 FINDINGS: Cardiac silhouette and mediastinal contours are within limits. Mild-to-moderate calcification within aortic arch. Overlying breast tissue contributes to homogeneous density overlying the lower lungs. No focal airspace opacity. No pleural effusion artifacts. Moderate multilevel degenerative disc changes of the thoracic spine. IMPRESSION: No active cardiopulmonary disease. Electronically Signed   By:  Yvonne Kendall M.D.   On: 01/02/2023 12:43   CT Head Wo Contrast  Result Date: 01/02/2023 CLINICAL DATA:  Dizziness, difficulty with speech EXAM: CT HEAD WITHOUT CONTRAST TECHNIQUE: Contiguous axial images were obtained from the base of the skull through the vertex without intravenous contrast. RADIATION DOSE REDUCTION: This exam was performed according to the departmental dose-optimization program which includes automated exposure control, adjustment of the mA and/or kV according to patient size and/or use of iterative reconstruction technique. COMPARISON:  04/16/2022 FINDINGS: Brain: The brainstem, cerebellum, cerebral peduncles, thalami, basal ganglia, basilar cisterns, and ventricular system appear within normal limits. Periventricular white matter and corona radiata hypodensities favor chronic ischemic microvascular white matter disease. No intracranial hemorrhage, mass lesion, or acute CVA. Vascular: There is atherosclerotic calcification of the cavernous carotid arteries bilaterally. Skull: Unremarkable Sinuses/Orbits: Chronic bilateral maxillary and ethmoid sinusitis. Small left mastoid effusion Other: No supplemental non-categorized findings. IMPRESSION: 1. No acute intracranial findings. 2. Periventricular white matter and corona radiata hypodensities favor chronic ischemic microvascular white matter disease. 3. Atherosclerosis. 4. Chronic bilateral maxillary and ethmoid sinusitis. Small left mastoid effusion. Electronically Signed   By: Van Clines M.D.   On: 01/02/2023 12:07    Pending Labs Unresulted Labs (From admission, onward)     Start     Ordered   01/03/23 0500  Lipid panel  (Labs)  Tomorrow morning,   R       Comments: Fasting    01/02/23 1443   01/02/23 1441  Hemoglobin A1c  (Labs)  Once,   R       Comments: To assess prior glycemic control    01/02/23 1443            Vitals/Pain Today's Vitals   01/02/23 1030 01/02/23 1045 01/02/23 1100 01/02/23 1130  BP: (!)  172/60 (!) 163/69 (!) 174/68 (!) 178/76  Pulse: 62 (!) 59 60 67  Resp: 15 13 15  (!) 21  Temp:      TempSrc:      SpO2: 99% 98% 98% 100%  Weight:      Height:      PainSc:        Isolation Precautions No active isolations  Medications Medications  diphenhydrAMINE (BENADRYL) capsule 50 mg (has no administration in time range)    Or  diphenhydrAMINE (BENADRYL) injection 50 mg (has no administration in time range)  aspirin chewable tablet 81 mg (has no administration in time range)  HYDROcodone-acetaminophen (NORCO) 10-325 MG per tablet 1 tablet (has no administration in time range)  atorvastatin (LIPITOR) tablet 40 mg (has no administration in time range)  cholestyramine (QUESTRAN) packet 0.5 packet (has no administration in time range)  hydrALAZINE (APRESOLINE) injection 5 mg (has no administration in time range)  amLODipine (NORVASC) tablet 10 mg (has no administration in time range)  furosemide (LASIX) tablet 20 mg (has no administration in time range)  hydrALAZINE (APRESOLINE) tablet 100 mg (has no administration in time range)  losartan (COZAAR) tablet 100 mg (has no administration in time range)  metoprolol succinate (TOPROL-XL) 24 hr tablet 100 mg (has no administration in time range)  mirtazapine (REMERON) tablet 15 mg (has no administration in time range)  venlafaxine XR (EFFEXOR-XR) 24 hr capsule 75 mg (has no administration in time range)  ondansetron (ZOFRAN) tablet 4 mg (has no administration in time range)  pantoprazole (PROTONIX) EC tablet 40 mg (has no administration in time range)  clopidogrel (PLAVIX) tablet 75 mg (has no administration in time range)  Polysaccharide-Iron Complex CAPS 1 tablet (has no administration in time range)  levETIRAcetam (KEPPRA) tablet 500 mg (has no administration in time range)  albuterol (PROVENTIL) (2.5 MG/3ML) 0.083% nebulizer solution 2.5 mg (has no administration in time range)  fluticasone (FLONASE) 50 MCG/ACT nasal spray 2 spray  (has no administration in time range)  montelukast (SINGULAIR) tablet 10 mg (has no administration in time range)   stroke: early stages of recovery book (has no administration in time range)  acetaminophen (TYLENOL) tablet 650 mg (has no administration in time range)    Or  acetaminophen (TYLENOL) 160 MG/5ML solution 650 mg (has no administration in time range)    Or  acetaminophen (TYLENOL) suppository 650 mg (has no administration in time range)  senna-docusate (Senokot-S) tablet 1 tablet (has no administration in time range)  heparin injection 5,000 Units (has no administration in time range)  meclizine (ANTIVERT) tablet 12.5 mg (has no administration in time range)  lactated ringers bolus 1,000 mL (0 mLs Intravenous Stopped 01/02/23 1302)    Mobility walks     Focused Assessments Neuro Assessment Handoff:  Swallow screen pass? Yes  Cardiac Rhythm: Normal sinus rhythm       Neuro Assessment: Exceptions to WDL (intermittent dizziness)     R Recommendations: See Admitting Provider Note  Report given to:   Additional Notes: A&Ox4, ambulatory, dizzy w/ movement, using bedside commode. Has short gut syndrome so having diarrhea.

## 2023-01-02 NOTE — ED Notes (Signed)
Got patient on the monitor did EKG shown to Dr Wyvonnia Dusky got patient into a gown patient is resting with call bell in reach

## 2023-01-02 NOTE — ED Provider Notes (Signed)
[facility] Provider Note   CSN: [id number] Arrival date & time: 01/02/23  1019     History  Chief Complaint  Patient presents with   Generalized Weakness    [name] is a 77 y.o. female.   [name] is a 77 y.o. female with medical history significant of CAD; DM; HLD; HTN; PVD; chronic pancreatitis; ischemic bowel with colectomy/colostomy with subsequent reversal; seizure; OSA not on CPAP, TCAR Dr. Virl Cagey July 2023. Presents with a 3-week history of lightheadedness, generalized weakness and intermittent dizziness.  She describes dizzy spells when she changes position that last for several hours at a time.  Spells are quite frequent and last most of the day seems to be worse when she is up moving around.  She denies any room spinning but does feel like she has had memory issues and feels generally weak.  She has chronic diarrhea from her previous colectomy which is not significantly changed.  No black or bloody stools.  No vomiting.  No fever.  No chest pain or shortness of breath.  States she came in today because she is concerned that spells are getting worse and she is getting more dizzy and more lightheaded.  Did have a stroke last year but denies any residual deficits.  Also had revascularization of her right carotid artery. States she has been having persistent headaches intermittently for the past several months as well worse in the morning described as a gradual onset without thunderclap etiology.  Denies any focal weakness, numbness or tingling.  Denies any change in her chronic diarrhea.  Denies any vomiting or fever.  The history is provided by the patient.       Home Medications Prior to Admission medications   Medication Sig Start Date End Date Taking? Authorizing Provider  acetaminophen (TYLENOL) 325 MG tablet Take 650 mg by mouth every 6 (six) hours as needed for mild pain.    [provider]  albuterol  (VENTOLIN HFA) 108 (90 Base) MCG/ACT inhaler Inhale 1-2 puffs into the lungs every 6 (six) hours as needed for wheezing or shortness of breath. 03/30/21   [provider]  amLODipine (NORVASC) 10 MG tablet Take 10 mg by mouth daily.    [provider]  aspirin 81 MG chewable tablet Chew 1 tablet (81 mg total) by mouth daily. 04/19/22   Patrecia Pour, MD  atorvastatin (LIPITOR) 40 MG tablet Take 1 tablet (40 mg total) by mouth daily. 04/19/22   Patrecia Pour, MD  cholestyramine Lucrezia Starch) 4 g packet Take 0.5 packets by mouth in the morning and at bedtime. 03/30/22   [provider]  clopidogrel (PLAVIX) 75 MG tablet Take 1 tablet (75 mg total) by mouth daily. 04/19/22   Patrecia Pour, MD  [employer] DIGESTIVE PROBIOTIC 250 MG capsule Take 500 mg by mouth daily. 06/27/21   [provider]  fluticasone (FLONASE) 50 MCG/ACT nasal spray Place 2 sprays into both nostrils daily as needed for allergies. 01/29/16   [provider]  furosemide (LASIX) 20 MG tablet Take 20 mg by mouth daily. 08/06/21   [provider]  hydrALAZINE (APRESOLINE) 100 MG tablet Take 1 tablet (100 mg total) by mouth every 8 (eight) hours. 04/09/22   Thurnell Lose, MD  HYDROcodone-acetaminophen (NORCO) 10-325 MG tablet Take 1 tablet by mouth 4 (four) times daily as needed for moderate pain. 04/01/22   [provider]  levETIRAcetam (KEPPRA) 500 MG tablet  TAKE 1 TABLET BY MOUTH TWICE A DAY Patient taking differently: Take 500 mg by mouth 2 (two) times daily. 01/20/20   Dohmeier, Asencion Partridge, MD  losartan (COZAAR) 100 MG tablet Take 1 tablet (100 mg total) by mouth daily. Patient taking differently: Take 100 mg by mouth daily. Total of 150mg  12/28/21   Barb Merino, MD  losartan (COZAAR) 50 MG tablet Take 50 mg by mouth daily. Total of 150 mg 03/05/22   [provider]  magnesium oxide (MAG-OX) 400 MG tablet Take 1 tablet by mouth daily. 05/08/20   [provider]  metoprolol  succinate (TOPROL-XL) 100 MG 24 hr tablet Take 100 mg by mouth daily. 12/20/21   [provider]  metroNIDAZOLE (FLAGYL) 250 MG tablet Take 1 tablet (250 mg total) by mouth 3 (three) times daily. 12/04/22   Esterwood, Amy S, PA-C  mirtazapine (REMERON) 15 MG tablet Take 15 mg by mouth at bedtime. 07/18/21   [provider]  montelukast (SINGULAIR) 10 MG tablet Take 10 mg by mouth daily. 08/09/18   [provider]  nitroGLYCERIN (NITROSTAT) 0.4 MG SL tablet Place 1 tablet (0.4 mg total) under the tongue every 5 (five) minutes as needed for chest pain. 11/08/15   Martinique, Peter M, MD  ondansetron (ZOFRAN) 4 MG tablet Take 1 tablet (4 mg total) by mouth every 6 (six) hours as needed for nausea or vomiting. 10/24/21   Esterwood, Amy S, PA-C  Pancrelipase, Lip-Prot-Amyl, (ZENPEP) 40000-126000 units CPEP Take 2 capsules with meals and one capsule with snacks. Patient taking differently: Take 1 capsule by mouth daily with breakfast. 06/08/21   Ladene Artist, MD  pantoprazole (PROTONIX) 40 MG tablet Take 1 tablet (40 mg total) by mouth 2 (two) times daily. 01/31/22   Ladene Artist, MD  Polysaccharide-Iron Complex 150 MG CAPS Take 1 tablet by mouth daily.    [provider]  triamterene-hydrochlorothiazide (MAXZIDE-25) 37.5-25 MG tablet Take 0.5 tablets by mouth every morning. 12/20/21   [provider]  venlafaxine XR (EFFEXOR-XR) 75 MG 24 hr capsule Take 1 capsule (75 mg total) by mouth daily. 05/18/20   [provider]  vitamin B-12 (CYANOCOBALAMIN) 500 MCG tablet Take 500 mcg by mouth daily.    [provider]  Vitamin D, Ergocalciferol, (DRISDOL) 1.25 MG (50000 UT) CAPS capsule Take 50,000 Units by mouth every Wednesday.     [provider]      Allergies    Adhesive [tape], Other, Ace inhibitors, Sulfa antibiotics, Topiramate, Codeine, and Iodinated contrast media    Review of Systems   Review of Systems  Constitutional:  Positive for  activity change, appetite change and fatigue.  HENT:  Negative for congestion and rhinorrhea.   Respiratory:  Negative for cough, chest tightness and shortness of breath.   Cardiovascular:  Negative for chest pain.  Gastrointestinal:  Positive for abdominal pain and diarrhea. Negative for nausea and vomiting.  Genitourinary:  Negative for dysuria and hematuria.  Neurological:  Positive for dizziness, weakness, light-headedness and headaches.    all other systems are negative except as noted in the HPI and PMH.   Physical Exam Updated Vital Signs BP (!) 172/60   Pulse 62   Temp 97.8 F (36.6 C) (Oral)   Resp 15   Ht 5\' 3"  (1.6 m)   Wt 76.8 kg   SpO2 99%   BMI 29.99 kg/m  Physical Exam Vitals and nursing note reviewed.  Constitutional:      General: She is not  in acute distress.    Appearance: She is well-developed.  HENT:     Head: Normocephalic and atraumatic.     Mouth/Throat:     Pharynx: No oropharyngeal exudate.  Eyes:     Conjunctiva/sclera: Conjunctivae normal.     Pupils: Pupils are equal, round, and reactive to light.  Neck:     Comments: No meningismus. Cardiovascular:     Rate and Rhythm: Normal rate and regular rhythm.     Heart sounds: Normal heart sounds. No murmur heard. Pulmonary:     Effort: Pulmonary effort is normal. No respiratory distress.     Breath sounds: Normal breath sounds.  Abdominal:     Palpations: Abdomen is soft.     Tenderness: There is abdominal tenderness. There is no guarding or rebound.     Comments: Multiple well-healed surgical incisions.  Abdomen diffusely tender, no guarding or rebound  Musculoskeletal:        General: No tenderness. Normal range of motion.     Cervical back: Normal range of motion and neck supple.  Skin:    General: Skin is warm.  Neurological:     Mental Status: She is alert and oriented to person, place, and time.     Cranial Nerves: No cranial nerve deficit.     Motor: No abnormal muscle tone.      Coordination: Coordination normal.     Comments:  5/5 strength throughout. CN 2-12 intact.Equal grip strength.  No appreciable nystagmus.  No ataxia on finger-to-nose.  5/5 strength throughout involving her arms and legs.  Psychiatric:        Behavior: Behavior normal.     ED Results / Procedures / Treatments   Labs (all labs ordered are listed, but only abnormal results are displayed) Labs Reviewed  CBC WITH DIFFERENTIAL/PLATELET - Abnormal; Notable for the following components:      Result Value   Hemoglobin 11.9 (*)    All other components within normal limits  COMPREHENSIVE METABOLIC PANEL - Abnormal; Notable for the following components:   CO2 21 (*)    Glucose, Bld 107 (*)    Creatinine, Ser 1.88 (*)    Calcium 8.5 (*)    Total Protein 6.0 (*)    Albumin 3.2 (*)    GFR, Estimated 28 (*)    All other components within normal limits  URINALYSIS, ROUTINE W REFLEX MICROSCOPIC - Abnormal; Notable for the following components:   Color, Urine STRAW (*)    Specific Gravity, Urine 1.004 (*)    All other components within normal limits  LACTIC ACID, PLASMA  LIPASE, BLOOD  PROTIME-INR  HEMOGLOBIN A1C  LIPID PANEL  TROPONIN I (HIGH SENSITIVITY)  TROPONIN I (HIGH SENSITIVITY)    EKG EKG Interpretation  Date/Time:  Thursday January 02 2023 10:28:33 EDT Ventricular Rate:  61 PR Interval:  152 QRS Duration: 102 QT Interval:  424 QTC Calculation: 428 R Axis:   58 Text Interpretation: Sinus rhythm Abnormal T, consider ischemia, lateral leads Nonspecific T wave abnormality Confirmed by Ezequiel Essex [phone]) on 01/02/2023 10:44:52 AM  Radiology MR BRAIN WO CONTRAST  Result Date: 01/02/2023 CLINICAL DATA:  Neuro deficit, acute, stroke suspected EXAM: MRI HEAD WITHOUT AND WITH CONTRAST MRA HEAD WITHOUT CONTRAST MRA NECK WITHOUT AND WITH CONTRAST TECHNIQUE: Multiplanar, multiecho pulse sequences of the brain and surrounding structures were obtained without and with intravenous  contrast. Angiographic images of the Circle of Willis were obtained using MRA technique without intravenous contrast. Angiographic images of the neck were  obtained using MRA technique without and with intravenous contrast. Carotid stenosis measurements (when applicable) are obtained utilizing NASCET criteria, using the distal internal carotid diameter as the denominator. COMPARISON:  CTA head/neck 04/16/2022. FINDINGS: MRI HEAD FINDINGS Brain: No acute infarction, hemorrhage, hydrocephalus, extra-axial collection or mass lesion. Scattered T2/FLAIR hyperintensities white matter are nonspecific but compatible with chronic microvascular disease. No pathologic enhancement. Vascular: See below. Skull and upper cervical spine: Normal marrow signal. Sinuses/Orbits: Small amount of fluid in left maxillary sinus. Otherwise, clear sinuses. No acute orbital findings. Other: No mastoid effusions. MRA HEAD FINDINGS Anterior circulation: Absence of flow related signal in the left intracranial ICA to the level the carotid terminus, due to more proximal occlusion. Bilateral MCAs and ACAs are patent without proximal high-grade stenosis. Chronically hypoplastic left A1 ACA. Posterior circulation: Bilateral intradural vertebral arteries, basilar artery and bilateral posterior cerebral arteries are patent without proximal high-grade stenosis. MRA NECK FINDINGS Aorta: Great vessel origins are patent. Limited evaluation due to artifact. Right carotid: Persistent severe stenosis of the right ICA proximally and persistent occlusion of the left ICA in the upper neck. Vertebral arteries: Patent bilaterally without high-grade stenosis. IMPRESSION: 1. No evidence of acute intracranial abnormality. 2. Persistent severe stenosis of the right ICA proximally in the neck and persistent occlusion of the left ICA in the neck with distal reconstitution at the carotid terminus. A CTA could better quantify if clinically warranted. Electronically Signed    By: Margaretha Sheffield M.D.   On: 01/02/2023 17:48   MR ANGIO HEAD WO W CONTRAST  Result Date: 01/02/2023 CLINICAL DATA:  Neuro deficit, acute, stroke suspected EXAM: MRI HEAD WITHOUT AND WITH CONTRAST MRA HEAD WITHOUT CONTRAST MRA NECK WITHOUT AND WITH CONTRAST TECHNIQUE: Multiplanar, multiecho pulse sequences of the brain and surrounding structures were obtained without and with intravenous contrast. Angiographic images of the Circle of Willis were obtained using MRA technique without intravenous contrast. Angiographic images of the neck were obtained using MRA technique without and with intravenous contrast. Carotid stenosis measurements (when applicable) are obtained utilizing NASCET criteria, using the distal internal carotid diameter as the denominator. COMPARISON:  CTA head/neck 04/16/2022. FINDINGS: MRI HEAD FINDINGS Brain: No acute infarction, hemorrhage, hydrocephalus, extra-axial collection or mass lesion. Scattered T2/FLAIR hyperintensities white matter are nonspecific but compatible with chronic microvascular disease. No pathologic enhancement. Vascular: See below. Skull and upper cervical spine: Normal marrow signal. Sinuses/Orbits: Small amount of fluid in left maxillary sinus. Otherwise, clear sinuses. No acute orbital findings. Other: No mastoid effusions. MRA HEAD FINDINGS Anterior circulation: Absence of flow related signal in the left intracranial ICA to the level the carotid terminus, due to more proximal occlusion. Bilateral MCAs and ACAs are patent without proximal high-grade stenosis. Chronically hypoplastic left A1 ACA. Posterior circulation: Bilateral intradural vertebral arteries, basilar artery and bilateral posterior cerebral arteries are patent without proximal high-grade stenosis. MRA NECK FINDINGS Aorta: Great vessel origins are patent. Limited evaluation due to artifact. Right carotid: Persistent severe stenosis of the right ICA proximally and persistent occlusion of the left ICA  in the upper neck. Vertebral arteries: Patent bilaterally without high-grade stenosis. IMPRESSION: 1. No evidence of acute intracranial abnormality. 2. Persistent severe stenosis of the right ICA proximally in the neck and persistent occlusion of the left ICA in the neck with distal reconstitution at the carotid terminus. A CTA could better quantify if clinically warranted. Electronically Signed   By: Margaretha Sheffield M.D.   On: 01/02/2023 17:48   MR Angiogram Neck W or Wo Contrast  Result Date: 01/02/2023 CLINICAL DATA:  Neuro deficit, acute, stroke suspected EXAM: MRI HEAD WITHOUT AND WITH CONTRAST MRA HEAD WITHOUT CONTRAST MRA NECK WITHOUT AND WITH CONTRAST TECHNIQUE: Multiplanar, multiecho pulse sequences of the brain and surrounding structures were obtained without and with intravenous contrast. Angiographic images of the Circle of Willis were obtained using MRA technique without intravenous contrast. Angiographic images of the neck were obtained using MRA technique without and with intravenous contrast. Carotid stenosis measurements (when applicable) are obtained utilizing NASCET criteria, using the distal internal carotid diameter as the denominator. COMPARISON:  CTA head/neck 04/16/2022. FINDINGS: MRI HEAD FINDINGS Brain: No acute infarction, hemorrhage, hydrocephalus, extra-axial collection or mass lesion. Scattered T2/FLAIR hyperintensities white matter are nonspecific but compatible with chronic microvascular disease. No pathologic enhancement. Vascular: See below. Skull and upper cervical spine: Normal marrow signal. Sinuses/Orbits: Small amount of fluid in left maxillary sinus. Otherwise, clear sinuses. No acute orbital findings. Other: No mastoid effusions. MRA HEAD FINDINGS Anterior circulation: Absence of flow related signal in the left intracranial ICA to the level the carotid terminus, due to more proximal occlusion. Bilateral MCAs and ACAs are patent without proximal high-grade stenosis.  Chronically hypoplastic left A1 ACA. Posterior circulation: Bilateral intradural vertebral arteries, basilar artery and bilateral posterior cerebral arteries are patent without proximal high-grade stenosis. MRA NECK FINDINGS Aorta: Great vessel origins are patent. Limited evaluation due to artifact. Right carotid: Persistent severe stenosis of the right ICA proximally and persistent occlusion of the left ICA in the upper neck. Vertebral arteries: Patent bilaterally without high-grade stenosis. IMPRESSION: 1. No evidence of acute intracranial abnormality. 2. Persistent severe stenosis of the right ICA proximally in the neck and persistent occlusion of the left ICA in the neck with distal reconstitution at the carotid terminus. A CTA could better quantify if clinically warranted. Electronically Signed   By: Margaretha Sheffield M.D.   On: 01/02/2023 17:48   CT ABDOMEN PELVIS WO CONTRAST  Result Date: 01/02/2023 CLINICAL DATA:  Generalized weakness, abdominal pain EXAM: CT ABDOMEN AND PELVIS WITHOUT CONTRAST TECHNIQUE: Multidetector CT imaging of the abdomen and pelvis was performed following the standard protocol without IV contrast. Unenhanced CT was performed per clinician order. Lack of IV contrast limits sensitivity and specificity, especially for evaluation of abdominal/pelvic solid viscera. RADIATION DOSE REDUCTION: This exam was performed according to the departmental dose-optimization program which includes automated exposure control, adjustment of the mA and/or kV according to patient size and/or use of iterative reconstruction technique. COMPARISON:  11/16/2020 FINDINGS: Lower chest: No acute pleural or parenchymal lung disease. Hepatobiliary: Unremarkable unenhanced appearance of the liver and gallbladder. No biliary duct dilation. Pancreas: Unremarkable unenhanced appearance. Spleen: Unremarkable unenhanced appearance. Adrenals/Urinary Tract: The adrenals are unremarkable. No urinary tract calculi or  obstructive uropathy within either kidney. Bladder is unremarkable. Stomach/Bowel: No bowel obstruction or ileus. Postsurgical changes from right hemicolectomy with reanastomosis in the lower central abdomen. No bowel wall thickening or inflammatory change. Small hiatal hernia. Vascular/Lymphatic: Aortic atherosclerosis. No enlarged abdominal or pelvic lymph nodes. Reproductive: Status post hysterectomy. No adnexal masses. Other: No free fluid or free intraperitoneal gas. Prior midline laparotomy, with 2 fat containing peri-incisional midline ventral hernias. No bowel herniation. Musculoskeletal: No acute or destructive bony lesions. Prominent spondylosis at the L2-3 level. Reconstructed images demonstrate no additional findings. IMPRESSION: 1. Unremarkable unenhanced CT of the abdomen and pelvis. No urinary tract calculi or obstructive uropathy. 2. Postsurgical changes from midline laparotomy, with fat containing peri-incisional ventral hernias. No bowel herniation. 3. Small hiatal hernia. 4.  Aortic Atherosclerosis (ICD10-I70.0). Electronically Signed   By: Randa Ngo M.D.   On: 01/02/2023 15:27   DG Chest 2 View  Result Date: 01/02/2023 CLINICAL DATA:  Denies weakness and intermittent dizziness for 3 weeks. EXAM: CHEST - 2 VIEW COMPARISON:  AP chest 04/08/2022 and 12/25/2021 FINDINGS: Cardiac silhouette and mediastinal contours are within limits. Mild-to-moderate calcification within aortic arch. Overlying breast tissue contributes to homogeneous density overlying the lower lungs. No focal airspace opacity. No pleural effusion artifacts. Moderate multilevel degenerative disc changes of the thoracic spine. IMPRESSION: No active cardiopulmonary disease. Electronically Signed   By: Yvonne Kendall M.D.   On: 01/02/2023 12:43   CT Head Wo Contrast  Result Date: 01/02/2023 CLINICAL DATA:  Dizziness, difficulty with speech EXAM: CT HEAD WITHOUT CONTRAST TECHNIQUE: Contiguous axial images were obtained from  the base of the skull through the vertex without intravenous contrast. RADIATION DOSE REDUCTION: This exam was performed according to the departmental dose-optimization program which includes automated exposure control, adjustment of the mA and/or kV according to patient size and/or use of iterative reconstruction technique. COMPARISON:  04/16/2022 FINDINGS: Brain: The brainstem, cerebellum, cerebral peduncles, thalami, basal ganglia, basilar cisterns, and ventricular system appear within normal limits. Periventricular white matter and corona radiata hypodensities favor chronic ischemic microvascular white matter disease. No intracranial hemorrhage, mass lesion, or acute CVA. Vascular: There is atherosclerotic calcification of the cavernous carotid arteries bilaterally. Skull: Unremarkable Sinuses/Orbits: Chronic bilateral maxillary and ethmoid sinusitis. Small left mastoid effusion Other: No supplemental non-categorized findings. IMPRESSION: 1. No acute intracranial findings. 2. Periventricular white matter and corona radiata hypodensities favor chronic ischemic microvascular white matter disease. 3. Atherosclerosis. 4. Chronic bilateral maxillary and ethmoid sinusitis. Small left mastoid effusion. Electronically Signed   By: Van Clines M.D.   On: 01/02/2023 12:07    Procedures Procedures    Medications Ordered in ED Medications  lactated ringers bolus 1,000 mL (has no administration in time range)    ED Course/ Medical Decision Making/ A&P                             Medical Decision Making Amount and/or Complexity of Data Reviewed Labs: ordered. Decision-making details documented in ED Course. Radiology: ordered and independent interpretation performed. Decision-making details documented in ED Course. ECG/medicine tests: ordered and independent interpretation performed. Decision-making details documented in ED Course.  Risk Prescription drug management. Decision regarding  hospitalization.   Generalized weakness for several weeks with intermittent dizziness and headaches.  Vital stable on arrival.  No focal neurological deficits.  Orthostatics are negative.  EKG is sinus rhythm with nonspecific T wave changes laterally.  Patient does have a nonfocal neurological exam.  She is gently hydrated.  Orthostatics are negative.  Creatinine is at baseline.  Hemoglobin is at baseline.  Chest x-ray negative for infiltrate.  CT head shows chronic changes without acute ischemia or hemorrhage.  Results reviewed and interpreted by me.  There is some concern for recurrent TIA type symptoms and near syncope.  Given patient's carotid artery stent will plan on obtaining CT angiogram to assess for patency of the stent.  She reports an IV contrast allergy with a rash only.  Per radiology tech, her creatinine precludes IV contrast even though she has had it before with this same creatinine.  Will change to MRI and MRA.  CT abdomen pelvis was performed without contrast and shows no acute findings.   MRI/MRA pending to rule out new stroke  or vascular insufficiency  Admission discussed with Dr. Roosevelt Locks.       Final Clinical Impression(s) / ED Diagnoses Final diagnoses:  Near syncope  Dizziness    Rx / DC Orders ED Discharge Orders     None         Denny Mccree, Annie Main, MD 01/02/23 1754

## 2023-01-02 NOTE — ED Triage Notes (Signed)
Pt arrived to ED via EMS from home w/ c/o generalized weakness and intermittent dizziness x 3 weeks. VSS w/ EMS. Pt in NAD.  EMS VS: HR 62, 94% RA, 174/80, CBG 134 20g L wrist

## 2023-01-02 NOTE — ED Notes (Signed)
Help get patient cleaned up placed a brief patient is resting with call bell in reach 

## 2023-01-02 NOTE — ED Notes (Signed)
Pt transported to MRI 

## 2023-01-03 ENCOUNTER — Observation Stay (HOSPITAL_COMMUNITY): Payer: Medicare HMO

## 2023-01-03 DIAGNOSIS — E785 Hyperlipidemia, unspecified: Secondary | ICD-10-CM | POA: Diagnosis present

## 2023-01-03 DIAGNOSIS — K760 Fatty (change of) liver, not elsewhere classified: Secondary | ICD-10-CM | POA: Diagnosis present

## 2023-01-03 DIAGNOSIS — G4733 Obstructive sleep apnea (adult) (pediatric): Secondary | ICD-10-CM | POA: Diagnosis present

## 2023-01-03 DIAGNOSIS — Z882 Allergy status to sulfonamides status: Secondary | ICD-10-CM | POA: Diagnosis not present

## 2023-01-03 DIAGNOSIS — I5022 Chronic systolic (congestive) heart failure: Secondary | ICD-10-CM | POA: Diagnosis present

## 2023-01-03 DIAGNOSIS — J4520 Mild intermittent asthma, uncomplicated: Secondary | ICD-10-CM | POA: Diagnosis present

## 2023-01-03 DIAGNOSIS — K861 Other chronic pancreatitis: Secondary | ICD-10-CM | POA: Diagnosis present

## 2023-01-03 DIAGNOSIS — R202 Paresthesia of skin: Secondary | ICD-10-CM | POA: Diagnosis not present

## 2023-01-03 DIAGNOSIS — I2510 Atherosclerotic heart disease of native coronary artery without angina pectoris: Secondary | ICD-10-CM

## 2023-01-03 DIAGNOSIS — Z885 Allergy status to narcotic agent status: Secondary | ICD-10-CM | POA: Diagnosis not present

## 2023-01-03 DIAGNOSIS — I130 Hypertensive heart and chronic kidney disease with heart failure and stage 1 through stage 4 chronic kidney disease, or unspecified chronic kidney disease: Secondary | ICD-10-CM | POA: Diagnosis present

## 2023-01-03 DIAGNOSIS — E669 Obesity, unspecified: Secondary | ICD-10-CM | POA: Diagnosis present

## 2023-01-03 DIAGNOSIS — I10 Essential (primary) hypertension: Secondary | ICD-10-CM | POA: Diagnosis not present

## 2023-01-03 DIAGNOSIS — Z888 Allergy status to other drugs, medicaments and biological substances status: Secondary | ICD-10-CM | POA: Diagnosis not present

## 2023-01-03 DIAGNOSIS — E1122 Type 2 diabetes mellitus with diabetic chronic kidney disease: Secondary | ICD-10-CM | POA: Diagnosis present

## 2023-01-03 DIAGNOSIS — Z6833 Body mass index (BMI) 33.0-33.9, adult: Secondary | ICD-10-CM | POA: Diagnosis not present

## 2023-01-03 DIAGNOSIS — G459 Transient cerebral ischemic attack, unspecified: Secondary | ICD-10-CM | POA: Diagnosis present

## 2023-01-03 DIAGNOSIS — E1151 Type 2 diabetes mellitus with diabetic peripheral angiopathy without gangrene: Secondary | ICD-10-CM | POA: Diagnosis present

## 2023-01-03 DIAGNOSIS — G40909 Epilepsy, unspecified, not intractable, without status epilepticus: Secondary | ICD-10-CM | POA: Diagnosis present

## 2023-01-03 DIAGNOSIS — N184 Chronic kidney disease, stage 4 (severe): Secondary | ICD-10-CM

## 2023-01-03 DIAGNOSIS — Z87891 Personal history of nicotine dependence: Secondary | ICD-10-CM | POA: Diagnosis not present

## 2023-01-03 DIAGNOSIS — Z91040 Latex allergy status: Secondary | ICD-10-CM | POA: Diagnosis not present

## 2023-01-03 DIAGNOSIS — Z91041 Radiographic dye allergy status: Secondary | ICD-10-CM | POA: Diagnosis not present

## 2023-01-03 DIAGNOSIS — Z8673 Personal history of transient ischemic attack (TIA), and cerebral infarction without residual deficits: Secondary | ICD-10-CM | POA: Diagnosis not present

## 2023-01-03 DIAGNOSIS — I639 Cerebral infarction, unspecified: Secondary | ICD-10-CM

## 2023-01-03 DIAGNOSIS — Z91048 Other nonmedicinal substance allergy status: Secondary | ICD-10-CM | POA: Diagnosis not present

## 2023-01-03 LAB — LIPID PANEL
Cholesterol: 98 mg/dL (ref 0–200)
HDL: 49 mg/dL (ref >40)
LDL Cholesterol: 25 mg/dL (ref 0–99)
Total CHOL/HDL Ratio: 2 RATIO
Triglycerides: 119 mg/dL (ref <150)
VLDL: 24 mg/dL (ref 0–40)

## 2023-01-03 LAB — HEMOGLOBIN A1C
Hgb A1c MFr Bld: 6.5 % — ABNORMAL HIGH (ref 4.8–5.6)
Mean Plasma Glucose: 140 mg/dL

## 2023-01-03 MED ORDER — SUCRALFATE 1 GM/10ML PO SUSP
1.0000 g | Freq: Three times a day (TID) | ORAL | Status: DC
  Administered 2023-01-03 – 2023-01-04 (×5): 1 g via ORAL
  Filled 2023-01-03 (×5): qty 10

## 2023-01-03 MED ORDER — POLYETHYLENE GLYCOL 3350 17 G PO PACK
17.0000 g | PACK | Freq: Two times a day (BID) | ORAL | Status: DC
  Administered 2023-01-03: 17 g via ORAL
  Filled 2023-01-03 (×2): qty 1

## 2023-01-03 MED ORDER — PANCRELIPASE (LIP-PROT-AMYL) 36000-114000 UNITS PO CPEP
36000.0000 [IU] | ORAL_CAPSULE | Freq: Every day | ORAL | Status: DC
  Administered 2023-01-03 – 2023-01-04 (×2): 36000 [IU] via ORAL
  Filled 2023-01-03 (×2): qty 1

## 2023-01-03 MED ORDER — TRIAMTERENE-HCTZ 37.5-25 MG PO TABS
1.0000 | ORAL_TABLET | Freq: Every day | ORAL | Status: DC
  Administered 2023-01-03 – 2023-01-04 (×2): 1 via ORAL
  Filled 2023-01-03 (×3): qty 1

## 2023-01-03 NOTE — Progress Notes (Addendum)
PROGRESS NOTE        PATIENT DETAILS Name: [name] Age: 77 y.o. Sex: female Date of Birth: Jul 06, 1946 Admit Date: 01/02/2023 Admitting Physician Lequita Halt, MD [id number], Ermalene Searing, MD  Brief Summary: Patient is a 77 y.o.  female with history of CVA-s/p right TCAR 2023, chronic HFrEF, seizure disorder, CKD stage IV, chronic pancreatitis who presented with 2-3-week history of intermittent vertig/lightheadedness, nausea/upper abdominal discomfort.  Significant events: 3/21>> admit to [facility]  Significant studies: 3/21>> MRI brain: No acute CVA 3/21>> MRA brain/neck: Severe stenosis of right ICA proximally, occluded left ICA. 3/21>> CT abdomen/pelvis: No acute abnormalities. 3/21>> lipase: 21  Significant microbiology data: None  Procedures: None  Consults: None  Subjective: Claims she is "dizzy" today-and feels "sick to her stomach".  When asked to clarify-she claims that she is lightheaded today, but was having vertigo yesterday.  She has nausea this morning-and continues to have upper abdominal pain.  Asking whether her incisional hernias are the problem.  Objective: Vitals: Blood pressure (!) 140/76, pulse (!) 107, temperature 98.5 F (36.9 C), temperature source Oral, resp. rate 15, height 5\' 3"  (1.6 m), weight 85.6 kg, SpO2 94 %.   Exam: Gen Exam:Alert awake-not in any distress HEENT:atraumatic, normocephalic Chest: B/L clear to auscultation anteriorly CVS:S1S2 regular Abdomen:soft non tender, non distended Extremities:no edema Neurology: Non focal Skin: no rash  Pertinent Labs/Radiology:    Latest Ref Rng & Units 01/02/2023   10:44 AM 04/19/2022    4:20 AM 04/15/2022    9:46 AM  CBC  WBC 4.0 - 10.5 K/uL 8.2  8.6    Hemoglobin 12.0 - 15.0 g/dL 11.9  9.3  12.2   Hematocrit 36.0 - 46.0 % 37.9  28.7  36.0   Platelets 150 - 400 K/uL 214  215      Lab Results  Component Value Date   NA 135 01/02/2023   K 4.6 01/02/2023   CL  105 01/02/2023   CO2 21 (L) 01/02/2023      Assessment/Plan: Dizzy spells/vertigo Vague complaints-acknowledges both lightheadedness and vertigo MRI brain without any major abnormalities Orthostatic vital signs negative Telemetry negative. Her blood pressure has been on the higher side  for the past several days-this may be contributing to her symptoms. Attempt better BP control and see how she does Mobilize/ambulate with PT/OT.  Nausea/epigastric pain Known history of chronic pancreatitis Lipase levels within normal limit Completely benign abdominal exam CT abdomen without any major abnormalities Resume pancreatic enzymes Continue PPI twice daily Trial of Carafate and MiraLAX twice daily-and see how she does Note-EGD January 2023 showed gastritis/erosive gastropathy with a hiatal hernia  Uncontrolled HTN BP on the higher side this morning Continue current dosing of amlodipine/hydralazine/losartan/metoprolol/Lasix-resume Maxide (dosage increased from 0.5 tab to 1 tab) Follow/optimize  History of CVA Chronic left ICA occlusion Right carotid artery stenosis-s/p TCAR July 2023 Continue aspirin/Plavix/statin Will discuss with vascular surgery regarding MRA neck still showing significant carotid artery stenosis.  Addendum: Discussed with vascular surgeon-Dr. Robbins-recommends right carotid artery Doppler ultrasound.  CKD stage IV At baseline  Seizure disorder Keppra  Bronchial asthma Not in exacerbation As needed bronchodilators  OSA  CPAP nightly  Obesity: Estimated body mass index is 33.43 kg/m as calculated from the following:   Height as of this encounter: 5\' 3"  (1.6 m).   Weight as of this encounter: 85.6  kg.   Code status:   Code Status: Full Code   DVT Prophylaxis: heparin injection 5,000 Units Start: 01/02/23 2200   Family Communication: None at bedside   Disposition Plan: Status is: Observation The patient will require care spanning > 2  midnights and should be moved to inpatient because: Severity of illness   Planned Discharge Destination:Home hopefully 3/23 if symptoms are better.   Diet: Diet Order             Diet heart healthy/carb modified Room service appropriate? Yes; Fluid consistency: Thin  Diet effective now                     Antimicrobial agents: Anti-infectives (From admission, onward)    None        MEDICATIONS: Scheduled Meds:  amLODipine  10 mg Oral Daily   aspirin  81 mg Oral Daily   atorvastatin  40 mg Oral Daily   cholestyramine  0.5 packet Oral Q12H   clopidogrel  75 mg Oral Daily   diphenhydrAMINE  50 mg Oral Once   Or   diphenhydrAMINE  50 mg Intravenous Once   furosemide  20 mg Oral Daily   heparin  5,000 Units Subcutaneous Q12H   hydrALAZINE  100 mg Oral Q8H   iron polysaccharides  150 mg Oral Daily   levETIRAcetam  500 mg Oral BID   losartan  100 mg Oral Daily   metoprolol succinate  100 mg Oral Daily   mirtazapine  15 mg Oral QHS   montelukast  10 mg Oral Daily   pantoprazole  40 mg Oral BID   triamterene-hydrochlorothiazide  1 tablet Oral Daily   venlafaxine XR  75 mg Oral Daily   Continuous Infusions: PRN Meds:.acetaminophen **OR** acetaminophen (TYLENOL) oral liquid 160 mg/5 mL **OR** acetaminophen, albuterol, fluticasone, hydrALAZINE, HYDROcodone-acetaminophen, meclizine, ondansetron, senna-docusate   I have personally reviewed following labs and imaging studies  LABORATORY DATA: CBC: Recent Labs  Lab 01/02/23 1044  WBC 8.2  NEUTROABS 4.2  HGB 11.9*  HCT 37.9  MCV 96.7  PLT [id number]    Basic Metabolic Panel: Recent Labs  Lab 01/02/23 1044  NA 135  K 4.6  CL 105  CO2 21*  GLUCOSE 107*  BUN 21  CREATININE 1.88*  CALCIUM 8.5*    GFR: Estimated Creatinine Clearance: 26.8 mL/min (A) (by C-G formula based on SCr of 1.88 mg/dL (H)).  Liver Function Tests: Recent Labs  Lab 01/02/23 1044  AST 22  ALT 15  ALKPHOS 80  BILITOT 0.5  PROT 6.0*   ALBUMIN 3.2*   Recent Labs  Lab 01/02/23 1044  LIPASE 21   No results for input(s): "AMMONIA" in the last 168 hours.  Coagulation Profile: Recent Labs  Lab 01/02/23 1044  INR 1.1    Cardiac Enzymes: No results for input(s): "CKTOTAL", "CKMB", "CKMBINDEX", "TROPONINI" in the last 168 hours.  BNP (last 3 results) No results for input(s): "PROBNP" in the last 8760 hours.  Lipid Profile: Recent Labs    01/03/23 0625  CHOL 98  HDL 49  LDLCALC 25  TRIG 119  CHOLHDL 2.0    Thyroid Function Tests: No results for input(s): "TSH", "T4TOTAL", "FREET4", "T3FREE", "THYROIDAB" in the last 72 hours.  Anemia Panel: No results for input(s): "VITAMINB12", "FOLATE", "FERRITIN", "TIBC", "IRON", "RETICCTPCT" in the last 72 hours.  Urine analysis:    Component Value Date/Time   COLORURINE STRAW (A) 01/02/2023 1303   APPEARANCEUR CLEAR 01/02/2023 1303   LABSPEC 1.004 (  L) 01/02/2023 1303   PHURINE 5.0 01/02/2023 1303   GLUCOSEU NEGATIVE 01/02/2023 1303   HGBUR NEGATIVE 01/02/2023 1303   BILIRUBINUR NEGATIVE 01/02/2023 1303   KETONESUR NEGATIVE 01/02/2023 1303   PROTEINUR NEGATIVE 01/02/2023 1303   NITRITE NEGATIVE 01/02/2023 1303   LEUKOCYTESUR NEGATIVE 01/02/2023 1303    Sepsis Labs: Lactic Acid, Venous    Component Value Date/Time   LATICACIDVEN 1.1 01/02/2023 1045    MICROBIOLOGY: No results found for this or any previous visit (from the past 240 hour(s)).  RADIOLOGY STUDIES/RESULTS: MR BRAIN WO CONTRAST  Result Date: 01/02/2023 CLINICAL DATA:  Neuro deficit, acute, stroke suspected EXAM: MRI HEAD WITHOUT AND WITH CONTRAST MRA HEAD WITHOUT CONTRAST MRA NECK WITHOUT AND WITH CONTRAST TECHNIQUE: Multiplanar, multiecho pulse sequences of the brain and surrounding structures were obtained without and with intravenous contrast. Angiographic images of the Circle of Willis were obtained using MRA technique without intravenous contrast. Angiographic images of the neck were  obtained using MRA technique without and with intravenous contrast. Carotid stenosis measurements (when applicable) are obtained utilizing NASCET criteria, using the distal internal carotid diameter as the denominator. COMPARISON:  CTA head/neck 04/16/2022. FINDINGS: MRI HEAD FINDINGS Brain: No acute infarction, hemorrhage, hydrocephalus, extra-axial collection or mass lesion. Scattered T2/FLAIR hyperintensities white matter are nonspecific but compatible with chronic microvascular disease. No pathologic enhancement. Vascular: See below. Skull and upper cervical spine: Normal marrow signal. Sinuses/Orbits: Small amount of fluid in left maxillary sinus. Otherwise, clear sinuses. No acute orbital findings. Other: No mastoid effusions. MRA HEAD FINDINGS Anterior circulation: Absence of flow related signal in the left intracranial ICA to the level the carotid terminus, due to more proximal occlusion. Bilateral MCAs and ACAs are patent without proximal high-grade stenosis. Chronically hypoplastic left A1 ACA. Posterior circulation: Bilateral intradural vertebral arteries, basilar artery and bilateral posterior cerebral arteries are patent without proximal high-grade stenosis. MRA NECK FINDINGS Aorta: Great vessel origins are patent. Limited evaluation due to artifact. Right carotid: Persistent severe stenosis of the right ICA proximally and persistent occlusion of the left ICA in the upper neck. Vertebral arteries: Patent bilaterally without high-grade stenosis. IMPRESSION: 1. No evidence of acute intracranial abnormality. 2. Persistent severe stenosis of the right ICA proximally in the neck and persistent occlusion of the left ICA in the neck with distal reconstitution at the carotid terminus. A CTA could better quantify if clinically warranted. Electronically Signed   By: Margaretha Sheffield M.D.   On: 01/02/2023 17:48   MR ANGIO HEAD WO W CONTRAST  Result Date: 01/02/2023 CLINICAL DATA:  Neuro deficit, acute, stroke  suspected EXAM: MRI HEAD WITHOUT AND WITH CONTRAST MRA HEAD WITHOUT CONTRAST MRA NECK WITHOUT AND WITH CONTRAST TECHNIQUE: Multiplanar, multiecho pulse sequences of the brain and surrounding structures were obtained without and with intravenous contrast. Angiographic images of the Circle of Willis were obtained using MRA technique without intravenous contrast. Angiographic images of the neck were obtained using MRA technique without and with intravenous contrast. Carotid stenosis measurements (when applicable) are obtained utilizing NASCET criteria, using the distal internal carotid diameter as the denominator. COMPARISON:  CTA head/neck 04/16/2022. FINDINGS: MRI HEAD FINDINGS Brain: No acute infarction, hemorrhage, hydrocephalus, extra-axial collection or mass lesion. Scattered T2/FLAIR hyperintensities white matter are nonspecific but compatible with chronic microvascular disease. No pathologic enhancement. Vascular: See below. Skull and upper cervical spine: Normal marrow signal. Sinuses/Orbits: Small amount of fluid in left maxillary sinus. Otherwise, clear sinuses. No acute orbital findings. Other: No mastoid effusions. MRA HEAD FINDINGS Anterior circulation: Absence of  flow related signal in the left intracranial ICA to the level the carotid terminus, due to more proximal occlusion. Bilateral MCAs and ACAs are patent without proximal high-grade stenosis. Chronically hypoplastic left A1 ACA. Posterior circulation: Bilateral intradural vertebral arteries, basilar artery and bilateral posterior cerebral arteries are patent without proximal high-grade stenosis. MRA NECK FINDINGS Aorta: Great vessel origins are patent. Limited evaluation due to artifact. Right carotid: Persistent severe stenosis of the right ICA proximally and persistent occlusion of the left ICA in the upper neck. Vertebral arteries: Patent bilaterally without high-grade stenosis. IMPRESSION: 1. No evidence of acute intracranial abnormality. 2.  Persistent severe stenosis of the right ICA proximally in the neck and persistent occlusion of the left ICA in the neck with distal reconstitution at the carotid terminus. A CTA could better quantify if clinically warranted. Electronically Signed   By: Margaretha Sheffield M.D.   On: 01/02/2023 17:48   MR Angiogram Neck W or Wo Contrast  Result Date: 01/02/2023 CLINICAL DATA:  Neuro deficit, acute, stroke suspected EXAM: MRI HEAD WITHOUT AND WITH CONTRAST MRA HEAD WITHOUT CONTRAST MRA NECK WITHOUT AND WITH CONTRAST TECHNIQUE: Multiplanar, multiecho pulse sequences of the brain and surrounding structures were obtained without and with intravenous contrast. Angiographic images of the Circle of Willis were obtained using MRA technique without intravenous contrast. Angiographic images of the neck were obtained using MRA technique without and with intravenous contrast. Carotid stenosis measurements (when applicable) are obtained utilizing NASCET criteria, using the distal internal carotid diameter as the denominator. COMPARISON:  CTA head/neck 04/16/2022. FINDINGS: MRI HEAD FINDINGS Brain: No acute infarction, hemorrhage, hydrocephalus, extra-axial collection or mass lesion. Scattered T2/FLAIR hyperintensities white matter are nonspecific but compatible with chronic microvascular disease. No pathologic enhancement. Vascular: See below. Skull and upper cervical spine: Normal marrow signal. Sinuses/Orbits: Small amount of fluid in left maxillary sinus. Otherwise, clear sinuses. No acute orbital findings. Other: No mastoid effusions. MRA HEAD FINDINGS Anterior circulation: Absence of flow related signal in the left intracranial ICA to the level the carotid terminus, due to more proximal occlusion. Bilateral MCAs and ACAs are patent without proximal high-grade stenosis. Chronically hypoplastic left A1 ACA. Posterior circulation: Bilateral intradural vertebral arteries, basilar artery and bilateral posterior cerebral  arteries are patent without proximal high-grade stenosis. MRA NECK FINDINGS Aorta: Great vessel origins are patent. Limited evaluation due to artifact. Right carotid: Persistent severe stenosis of the right ICA proximally and persistent occlusion of the left ICA in the upper neck. Vertebral arteries: Patent bilaterally without high-grade stenosis. IMPRESSION: 1. No evidence of acute intracranial abnormality. 2. Persistent severe stenosis of the right ICA proximally in the neck and persistent occlusion of the left ICA in the neck with distal reconstitution at the carotid terminus. A CTA could better quantify if clinically warranted. Electronically Signed   By: Margaretha Sheffield M.D.   On: 01/02/2023 17:48   CT ABDOMEN PELVIS WO CONTRAST  Result Date: 01/02/2023 CLINICAL DATA:  Generalized weakness, abdominal pain EXAM: CT ABDOMEN AND PELVIS WITHOUT CONTRAST TECHNIQUE: Multidetector CT imaging of the abdomen and pelvis was performed following the standard protocol without IV contrast. Unenhanced CT was performed per clinician order. Lack of IV contrast limits sensitivity and specificity, especially for evaluation of abdominal/pelvic solid viscera. RADIATION DOSE REDUCTION: This exam was performed according to the departmental dose-optimization program which includes automated exposure control, adjustment of the mA and/or kV according to patient size and/or use of iterative reconstruction technique. COMPARISON:  11/16/2020 FINDINGS: Lower chest: No acute pleural or parenchymal lung  disease. Hepatobiliary: Unremarkable unenhanced appearance of the liver and gallbladder. No biliary duct dilation. Pancreas: Unremarkable unenhanced appearance. Spleen: Unremarkable unenhanced appearance. Adrenals/Urinary Tract: The adrenals are unremarkable. No urinary tract calculi or obstructive uropathy within either kidney. Bladder is unremarkable. Stomach/Bowel: No bowel obstruction or ileus. Postsurgical changes from right  hemicolectomy with reanastomosis in the lower central abdomen. No bowel wall thickening or inflammatory change. Small hiatal hernia. Vascular/Lymphatic: Aortic atherosclerosis. No enlarged abdominal or pelvic lymph nodes. Reproductive: Status post hysterectomy. No adnexal masses. Other: No free fluid or free intraperitoneal gas. Prior midline laparotomy, with 2 fat containing peri-incisional midline ventral hernias. No bowel herniation. Musculoskeletal: No acute or destructive bony lesions. Prominent spondylosis at the L2-3 level. Reconstructed images demonstrate no additional findings. IMPRESSION: 1. Unremarkable unenhanced CT of the abdomen and pelvis. No urinary tract calculi or obstructive uropathy. 2. Postsurgical changes from midline laparotomy, with fat containing peri-incisional ventral hernias. No bowel herniation. 3. Small hiatal hernia. 4.  Aortic Atherosclerosis (ICD10-I70.0). Electronically Signed   By: Randa Ngo M.D.   On: 01/02/2023 15:27   DG Chest 2 View  Result Date: 01/02/2023 CLINICAL DATA:  Denies weakness and intermittent dizziness for 3 weeks. EXAM: CHEST - 2 VIEW COMPARISON:  AP chest 04/08/2022 and 12/25/2021 FINDINGS: Cardiac silhouette and mediastinal contours are within limits. Mild-to-moderate calcification within aortic arch. Overlying breast tissue contributes to homogeneous density overlying the lower lungs. No focal airspace opacity. No pleural effusion artifacts. Moderate multilevel degenerative disc changes of the thoracic spine. IMPRESSION: No active cardiopulmonary disease. Electronically Signed   By: Yvonne Kendall M.D.   On: 01/02/2023 12:43   CT Head Wo Contrast  Result Date: 01/02/2023 CLINICAL DATA:  Dizziness, difficulty with speech EXAM: CT HEAD WITHOUT CONTRAST TECHNIQUE: Contiguous axial images were obtained from the base of the skull through the vertex without intravenous contrast. RADIATION DOSE REDUCTION: This exam was performed according to the  departmental dose-optimization program which includes automated exposure control, adjustment of the mA and/or kV according to patient size and/or use of iterative reconstruction technique. COMPARISON:  04/16/2022 FINDINGS: Brain: The brainstem, cerebellum, cerebral peduncles, thalami, basal ganglia, basilar cisterns, and ventricular system appear within normal limits. Periventricular white matter and corona radiata hypodensities favor chronic ischemic microvascular white matter disease. No intracranial hemorrhage, mass lesion, or acute CVA. Vascular: There is atherosclerotic calcification of the cavernous carotid arteries bilaterally. Skull: Unremarkable Sinuses/Orbits: Chronic bilateral maxillary and ethmoid sinusitis. Small left mastoid effusion Other: No supplemental non-categorized findings. IMPRESSION: 1. No acute intracranial findings. 2. Periventricular white matter and corona radiata hypodensities favor chronic ischemic microvascular white matter disease. 3. Atherosclerosis. 4. Chronic bilateral maxillary and ethmoid sinusitis. Small left mastoid effusion. Electronically Signed   By: Van Clines M.D.   On: 01/02/2023 12:07     LOS: 0 days   Oren Binet, MD  [facility]    To contact the attending provider between 7A-7P or the covering provider during after hours 7P-7A, please log into the web site [email or web address] and access using universal  password for that web site. If you do not have the password, please call the hospital operator.  01/03/2023, 10:55 AM

## 2023-01-03 NOTE — Evaluation (Signed)
Occupational Therapy Evaluation Patient Details Name: [name] MRN: [id number] DOB: [birth year] Today's Date: 01/03/2023   History of Present Illness Pt is a 77 y/o female presenting with worsening dizziness, abdominal pain and systolic hypertension. CT abdomen shows small hiatal hernia. MRI brain neg for acute infarct though does show persistent severe stenosis of R ICA and L ICA in neck. PMH: CVA, R sided carotid artery stenosis s/p TCAR 2023, HTN, chronic HFrEF with LVEF 45-50%, ischemic bowel s/p partial colectomy/colostomy with subsequent reversal, seizure disorder, DM2, CKD, chronic pancreatitis.   Clinical Impression   PTA, pt lives alone and typically Independent with ADLs, IADLs, driving and mobility. Pt denies any recent falls despite persistent dizziness, nausea and generally not feeling well at home. Pt presents now fairly close to reported baseline with no more than Supervision needed for basic transfers, min guard for mobility and Supervision for LB ADLs. Extended time spent discussing safety precautions, fall prevention and self monitoring of symptoms at home. Pt hopeful to DC home soon. Anticipate no OT needs at DC but will continue to follow acutely.  BP supine: 140/76 BP sitting EOB: 166/97 BP standing: 144/77      Recommendations for follow up therapy are one component of a multi-disciplinary discharge planning process, led by the attending physician.  Recommendations may be updated based on patient status, additional functional criteria and insurance authorization.   Follow Up Recommendations  No OT follow up     Assistance Recommended at Discharge PRN  Patient can return home with the following      Functional Status Assessment  Patient has had a recent decline in their functional status and demonstrates the ability to make significant improvements in function in a reasonable and predictable amount of time.  Equipment Recommendations  None recommended by OT     Recommendations for Other Services       Precautions / Restrictions Precautions Precautions: Fall;Other (comment) Precaution Comments: monitor BP, dizziness Restrictions Weight Bearing Restrictions: No      Mobility Bed Mobility Overal bed mobility: Modified Independent                  Transfers Overall transfer level: Needs assistance Equipment used: None Transfers: Sit to/from Stand, Bed to chair/wheelchair/BSC Sit to Stand: Supervision     Step pivot transfers: Supervision     General transfer comment: for safety due to reports of intermittent dizziness though no LOB or safety concerns      Balance Overall balance assessment: Needs assistance Sitting-balance support: No upper extremity supported, Feet supported Sitting balance-Leahy Scale: Good     Standing balance support: No upper extremity supported, During functional activity Standing balance-Leahy Scale: Fair Standing balance comment: Fair+                           ADL either performed or assessed with clinical judgement   ADL Overall ADL's : Needs assistance/impaired Eating/Feeding: Independent   Grooming: Supervision/safety;Standing   Upper Body Bathing: Set up   Lower Body Bathing: Supervison/ safety   Upper Body Dressing : Set up   Lower Body Dressing: Supervision/safety   Toilet Transfer: Min guard;Ambulation   Toileting- Clothing Manipulation and Hygiene: Supervision/safety;Sit to/from stand;Sitting/lateral lean       Functional mobility during ADLs: Min guard General ADL Comments: Pt with minor limitations due to nausea,intermittent dizziness and generally feeling weak     Vision Baseline Vision/History: 1 Wears glasses Ability to See in Adequate Light:  0 Adequate Patient Visual Report: No change from baseline Vision Assessment?: No apparent visual deficits     Perception     Praxis      Pertinent Vitals/Pain Pain Assessment Pain Assessment: Faces Faces  Pain Scale: Hurts a little bit Pain Location: stomach Pain Descriptors / Indicators: Grimacing, Guarding Pain Intervention(s): Monitored during session     Hand Dominance Right   Extremity/Trunk Assessment Upper Extremity Assessment Upper Extremity Assessment: Overall WFL for tasks assessed   Lower Extremity Assessment Lower Extremity Assessment: Defer to PT evaluation   Cervical / Trunk Assessment Cervical / Trunk Assessment: Normal   Communication Communication Communication: No difficulties   Cognition Arousal/Alertness: Awake/alert Behavior During Therapy: WFL for tasks assessed/performed Overall Cognitive Status: Within Functional Limits for tasks assessed                                       General Comments       Exercises     Shoulder Instructions      Home Living Family/patient expects to be discharged to:: Private residence Living Arrangements: Alone Available Help at Discharge: Family;Friend(s);Available PRN/intermittently Type of Home: House Home Access: Stairs to enter [employer] of Steps: 2 Entrance Stairs-Rails: Right;Left Home Layout: One level     Bathroom Shower/Tub: Teacher, early years/pre: Standard Bathroom Accessibility: Yes   Home Equipment: Advice worker (2 wheels)   Additional Comments: Son and daughter-in-law live nearby. reports typically sleeping on the couch      Prior Functioning/Environment Prior Level of Function : Independent/Modified Independent;Driving             Mobility Comments: Indep without DME ADLs Comments: Independent, driving, simple meals. supposed to go on a girls trip to the beach next week        OT Problem List: Decreased strength;Impaired balance (sitting and/or standing);Decreased activity tolerance      OT Treatment/Interventions: Self-care/ADL training;Therapeutic exercise;Energy conservation;DME and/or AE instruction;Therapeutic  activities;Patient/family education    OT Goals(Current goals can be found in the care plan section) Acute Rehab OT Goals Patient Stated Goal: go to the beach next week OT Goal Formulation: With patient Time For Goal Achievement: 01/17/23 Potential to Achieve Goals: Good  OT Frequency: Min 2X/week    Co-evaluation              AM-PAC OT "6 Clicks" Daily Activity     Outcome Measure Help from another person eating meals?: None Help from another person taking care of personal grooming?: A Little Help from another person toileting, which includes using toliet, bedpan, or urinal?: A Little Help from another person bathing (including washing, rinsing, drying)?: A Little Help from another person to put on and taking off regular upper body clothing?: A Little Help from another person to put on and taking off regular lower body clothing?: A Little 6 Click Score: 19   End of Session Nurse Communication: Mobility status  Activity Tolerance: Patient tolerated treatment well Patient left: in chair;with call bell/phone within reach;with chair alarm set  OT Visit Diagnosis: Other abnormalities of gait and mobility (R26.89)                Time: [id number] OT Time Calculation (min): 31 min Charges:  OT General Charges $OT Visit: 1 Visit OT Evaluation $OT Eval Low Complexity: 1 Low OT Treatments $Therapeutic Activity: 8-22 mins  Malachy Chamber, OTR/L Acute Rehab Services  Office: [place] 01/03/2023, 9:24 AM

## 2023-01-03 NOTE — Progress Notes (Signed)
Patient refused cpap, says she tried years ago and can't sleep with it on.

## 2023-01-03 NOTE — Progress Notes (Signed)
Right carotid ultrasound study completed.   Please see CV Procedures for preliminary results.  Rilyn Scroggs, RVT  2:57 PM 01/03/23

## 2023-01-03 NOTE — Evaluation (Signed)
Physical Therapy Evaluation Patient Details Name: [name] MRN: [id number] DOB: 10-Mar-1946 Today's Date: 01/03/2023  History of Present Illness  Pt is a 77 y/o female presenting with worsening dizziness, abdominal pain and systolic hypertension. CT abdomen shows small hiatal hernia. MRI brain neg for acute infarct though does show persistent severe stenosis of R ICA and L ICA in neck. PMH: CVA, R sided carotid artery stenosis s/p TCAR 2023, HTN, chronic HFrEF with LVEF 45-50%, ischemic bowel s/p partial colectomy/colostomy with subsequent reversal, seizure disorder, DM2, CKD, chronic pancreatitis.  Clinical Impression  Patient presents with mobility likely close to her recent baseline.  Able to ambulate to bathroom and used walker, but sparingly pushing it forward and only with fingertips.  She reports nausea history and has extensive GI history as well as multiple medications that can provoke symptoms.  Even states last time she saw her GI MD they prescribed medication that she could not tolerate since it made her sick.  Feel she would benefit from follow up outpatient PT for progressing mobility, balance, safety and potentially for vestibular therapy.  However, feel she also need to follow up with GI MD, and have medication reconciliation with her PCP.  PT will follow up if not d/c.       Vestibular Assessment - 01/03/23 1235       Symptom Behavior   Subjective history of current problem Reports 2 week history of increased feeling of imbalance and now nausea.  Has history of intermittent dizziness.  No falls in past 6 months.  Long history of GI issues and multiple meds for that and other chronic issues.    Type of Dizziness  Imbalance   nausea   Frequency of Dizziness intermittent    Duration of Dizziness several minutes    Symptom Nature Motion provoked    Aggravating Factors Sit to stand    Relieving Factors Slow movements   distractions   Progression of Symptoms Better    History of  similar episodes yes      Oculomotor Exam   Oculomotor Alignment Normal    Ocular ROM WNL    Spontaneous Absent    Gaze-induced  Absent    Smooth Pursuits Intact    Saccades Intact;Slow      Oculomotor Exam-Fixation Suppressed    Left Head Impulse pt unable to tolerate with eyes open    Right Head Impulse pt unable to tolerate with eyes open   startles with quick head turns and moves eye from target     Vestibulo-Ocular Reflex   VOR Cancellation Normal               Recommendations for follow up therapy are one component of a multi-disciplinary discharge planning process, led by the attending physician.  Recommendations may be updated based on patient status, additional functional criteria and insurance authorization.  Follow Up Recommendations Outpatient PT      Assistance Recommended at Discharge PRN  Patient can return home with the following  Help with stairs or ramp for entrance;Assistance with cooking/housework    Equipment Recommendations None recommended by PT  Recommendations for Other Services       Functional Status Assessment Patient has had a recent decline in their functional status and demonstrates the ability to make significant improvements in function in a reasonable and predictable amount of time.     Precautions / Restrictions Precautions Precautions: Fall;Other (comment) Precaution Comments: monitor BP, dizziness Restrictions Weight Bearing Restrictions: No  Mobility  Bed Mobility               General bed mobility comments: up in chair    Transfers Overall transfer level: Needs assistance   Transfers: Sit to/from Stand Sit to Stand: Supervision           General transfer comment: assist for lines and cues for safety    Ambulation/Gait Ambulation/Gait assistance: Supervision Gait Distance (Feet): 40 Feet Assistive device: Rolling walker (2 wheels), None Gait Pattern/deviations: Step-through pattern, Decreased  stride length       General Gait Details: ambulating to bathroom with RW though not using it much pushing foward with fingertips; returned to chair as pt reported no need to use the bathroom and RN in to give meds  Stairs            Wheelchair Mobility    Modified Rankin (Stroke Patients Only)       Balance Overall balance assessment: Needs assistance   Sitting balance-Leahy Scale: Good     Standing balance support: No upper extremity supported Standing balance-Leahy Scale: Good Standing balance comment: standing no UE support without LOB or assist needed                             Pertinent Vitals/Pain Pain Assessment Faces Pain Scale: Hurts a little bit Pain Location: stomach Pain Descriptors / Indicators: Grimacing, Guarding Pain Intervention(s): Monitored during session    Home Living Family/patient expects to be discharged to:: Private residence Living Arrangements: Alone Available Help at Discharge: Family;Friend(s);Available PRN/intermittently Type of Home: House Home Access: Stairs to enter Entrance Stairs-Rails: Psychiatric nurse of Steps: 2   Home Layout: One level Home Equipment: Advice worker (2 wheels) Additional Comments: Son and daughter-in-law live nearby. reports typically sleeping on the couch    Prior Function Prior Level of Function : Independent/Modified Independent;Driving             Mobility Comments: Indep without DME ADLs Comments: Independent, driving, simple meals. supposed to go on a girls trip to the beach next week     Hand Dominance   Dominant Hand: Right    Extremity/Trunk Assessment   Upper Extremity Assessment Upper Extremity Assessment: Defer to OT evaluation    Lower Extremity Assessment Lower Extremity Assessment: Generalized weakness    Cervical / Trunk Assessment Cervical / Trunk Assessment: Normal  Communication   Communication: No difficulties  Cognition  Arousal/Alertness: Awake/alert Behavior During Therapy: WFL for tasks assessed/performed Overall Cognitive Status: Within Functional Limits for tasks assessed                                          General Comments General comments (skin integrity, edema, etc.): Educated in follow up with GI MD for nausea symptoms, for outpatient PT follow up for conditioning/balance/strength and for PCP for med reconcilliation.    Exercises     Assessment/Plan    PT Assessment Patient needs continued PT services  PT Problem List Decreased balance;Decreased activity tolerance;Decreased mobility;Decreased knowledge of use of DME       PT Treatment Interventions DME instruction;Gait training;Stair training;Therapeutic exercise;Therapeutic activities;Functional mobility training;Balance training;Patient/family education    PT Goals (Current goals can be found in the Care Plan section)  Acute Rehab PT Goals Patient Stated Goal: to improve nuasea symptoms PT Goal Formulation: With patient  Time For Goal Achievement: 01/10/23 Potential to Achieve Goals: Good    Frequency Min 3X/week     Co-evaluation               AM-PAC PT "6 Clicks" Mobility  Outcome Measure Help needed turning from your back to your side while in a flat bed without using bedrails?: None Help needed moving from lying on your back to sitting on the side of a flat bed without using bedrails?: None Help needed moving to and from a bed to a chair (including a wheelchair)?: A Little Help needed standing up from a chair using your arms (e.g., wheelchair or bedside chair)?: A Little Help needed to walk in hospital room?: A Little Help needed climbing 3-5 steps with a railing? : Total 6 Click Score: 18    End of Session   Activity Tolerance: Patient tolerated treatment well Patient left: in chair;with chair alarm set   PT Visit Diagnosis: Muscle weakness (generalized) (M62.81);Dizziness and giddiness  (R42)    Time: [id number] PT Time Calculation (min) (ACUTE ONLY): 23 min   Charges:   PT Evaluation $PT Eval Low Complexity: 1 Low PT Treatments $Neuromuscular Re-education: 8-22 mins        Magda Kiel, PT Acute Rehabilitation Services Office:[phone] 01/03/2023   Reginia Naas 01/03/2023, 10:02 AM

## 2023-01-03 NOTE — Progress Notes (Signed)
Right carotid artery with concern for stenosis on MRA.  Follow-up duplex ultrasound demonstrated no concerns, widely patent stent with both normal proximal and distal native.  I went by and talk to [place] regarding the above.  My plan is to follow up in the office in 6 months.  This has been scheduled.  [name] MD

## 2023-01-03 NOTE — TOC Progression Note (Signed)
Transition of Care [facility]) - Progression Note    Patient Details  Name: [name] MRN: [id number] Date of Birth: [birth year]  Transition of Care [facility]) CM/SW Contact  Levonne Lapping, RN Phone Number: 01/03/2023, 3:54 PM  Clinical Narrative:     Patient has been recommended OPPT .  Referral has been sent to [facility]. No DME has been recommended     TOC will continue to follow patient for any additional discharge needs           Expected Discharge Plan and Services                                               Social Determinants of Health (SDOH) Interventions SDOH Screenings   Food Insecurity: No Food Insecurity (01/02/2023)  Tobacco Use: Medium Risk (01/02/2023)    Readmission Risk Interventions     No data to display

## 2023-01-04 DIAGNOSIS — G459 Transient cerebral ischemic attack, unspecified: Secondary | ICD-10-CM | POA: Diagnosis not present

## 2023-01-04 LAB — CBC WITH DIFFERENTIAL/PLATELET
Abs Immature Granulocytes: 0.1 10*3/uL — ABNORMAL HIGH (ref 0.00–0.07)
Basophils Absolute: 0.1 10*3/uL (ref 0.0–0.1)
Basophils Relative: 1 %
Eosinophils Absolute: 0.2 10*3/uL (ref 0.0–0.5)
Eosinophils Relative: 2 %
HCT: 36.9 % (ref 36.0–46.0)
Hemoglobin: 12.3 g/dL (ref 12.0–15.0)
Immature Granulocytes: 1 %
Lymphocytes Relative: 21 %
Lymphs Abs: 1.7 10*3/uL (ref 0.7–4.0)
MCH: 30.8 pg (ref 26.0–34.0)
MCHC: 33.3 g/dL (ref 30.0–36.0)
MCV: 92.3 fL (ref 80.0–100.0)
Monocytes Absolute: 0.6 10*3/uL (ref 0.1–1.0)
Monocytes Relative: 7 %
Neutro Abs: 5.6 10*3/uL (ref 1.7–7.7)
Neutrophils Relative %: 68 %
Platelets: 212 10*3/uL (ref 150–400)
RBC: 4 MIL/uL (ref 3.87–5.11)
RDW: 13.5 % (ref 11.5–15.5)
WBC: 8.3 10*3/uL (ref 4.0–10.5)
nRBC: 0 % (ref 0.0–0.2)

## 2023-01-04 LAB — BASIC METABOLIC PANEL
Anion gap: 9 (ref 5–15)
BUN: 19 mg/dL (ref 8–23)
CO2: 22 mmol/L (ref 22–32)
Calcium: 8.8 mg/dL — ABNORMAL LOW (ref 8.9–10.3)
Chloride: 102 mmol/L (ref 98–111)
Creatinine, Ser: 1.61 mg/dL — ABNORMAL HIGH (ref 0.44–1.00)
GFR, Estimated: 33 mL/min — ABNORMAL LOW (ref >60)
Glucose, Bld: 107 mg/dL — ABNORMAL HIGH (ref 70–99)
Potassium: 4.2 mmol/L (ref 3.5–5.1)
Sodium: 133 mmol/L — ABNORMAL LOW (ref 135–145)

## 2023-01-04 LAB — BRAIN NATRIURETIC PEPTIDE: B Natriuretic Peptide: 109.5 pg/mL — ABNORMAL HIGH (ref 0.0–100.0)

## 2023-01-04 LAB — MAGNESIUM: Magnesium: 1.3 mg/dL — ABNORMAL LOW (ref 1.7–2.4)

## 2023-01-04 MED ORDER — MECLIZINE HCL 25 MG PO TABS: 12.5000 mg | ORAL_TABLET | Freq: Three times a day (TID) | ORAL | 0 refills | Status: AC | PRN

## 2023-01-04 MED ORDER — MAGNESIUM SULFATE 4 GM/100ML IV SOLN
4.0000 g | Freq: Once | INTRAVENOUS | Status: AC
  Administered 2023-01-04: 4 g via INTRAVENOUS
  Filled 2023-01-04: qty 100

## 2023-01-04 MED ORDER — HYDRALAZINE HCL 100 MG PO TABS: 100.0000 mg | ORAL_TABLET | Freq: Three times a day (TID) | ORAL | 0 refills | Status: DC

## 2023-01-04 NOTE — Progress Notes (Signed)
Pt ordered to discharge home. AVS reviewed with pt. Education provided as needed. Patient verbalized understanding. All questions answered. Waiting for her ride to arrive.

## 2023-01-04 NOTE — Discharge Instructions (Signed)
Follow with Primary MD Donnajean Lopes, MD in 7 days   Get CBC, CMP, Magnesium, TSH, 2 view Chest X ray -     Activity: As tolerated with Full fall precautions use walker/cane & assistance as needed  Disposition Home   Diet: Heart Healthy - Low Carb, check CBGs QAC-HS  Special Instructions: If you have smoked or chewed Tobacco  in the last 2 yrs please stop smoking, stop any regular Alcohol  and or any Recreational drug use.  On your next visit with your primary care physician please Get Medicines reviewed and adjusted.  Please request your Prim.MD to go over all Hospital Tests and Procedure/Radiological results at the follow up, please get all Hospital records sent to your Prim MD by signing hospital release before you go home.  If you experience worsening of your admission symptoms, develop shortness of breath, life threatening emergency, suicidal or homicidal thoughts you must seek medical attention immediately by calling 911 or calling your MD immediately  if symptoms less severe.  You Must read complete instructions/literature along with all the possible adverse reactions/side effects for all the Medicines you take and that have been prescribed to you. Take any new Medicines after you have completely understood and accpet all the possible adverse reactions/side effects.

## 2023-01-04 NOTE — Discharge Summary (Signed)
LORRI DIPRIMA [id number] DOB: September 21, 1946 DOA: 01/02/2023  PCP: Donnajean Lopes, MD  Admit date: 01/02/2023  Discharge date: 01/04/2023  Admitted From: Home   Disposition:  Home   Recommendations for Outpatient Follow-up:   Follow up with PCP in 1-2 weeks  PCP Please obtain BMP/CBC, 2 view CXR in 1week,  (see Discharge instructions)   PCP Please follow up on the following pending results: Monitor blood pressure, CBC, BMP, magnesium levels closely along with CBGs.   Home Health: PT   Equipment/Devices: walker  Consultations: VVS Discharge Condition: Stable    CODE STATUS: Full    Diet Recommendation: Heart Healthy Low Carb  Diet Order             Diet heart healthy/carb modified Room service appropriate? Yes; Fluid consistency: Thin  Diet effective now                    Chief Complaint  Patient presents with   Generalized Weakness     Brief history of present illness from the day of admission and additional interim summary    77 y.o.  female with history of CVA-s/p right TCAR 2023, chronic HFrEF, seizure disorder, CKD stage IV, chronic pancreatitis who presented with 2-3-week history of intermittent vertig/lightheadedness, nausea/upper abdominal discomfort.                                                                  Hospital Course   Dizzy spells/vertigo Vague complaints-acknowledges both lightheadedness and vertigo MRI brain without any major abnormalities Orthostatic vital signs negative Telemetry negative. Her blood pressure has been on the higher side  for the past several days-this may be contributing to her symptoms. Attempt better BP control and see how she does She did well with PT OT symptoms likely due to poorly controlled blood pressure, blood pressure in good control  with medications adjustment as below, discharged home with outpatient PCP follow-up.  Home PT and walker.   Nausea/epigastric pain Known history of chronic pancreatitis Lipase levels within normal limit Completely benign abdominal exam CT abdomen without any major abnormalities Resumed pancreatic enzymes Continue PPI   Her symptoms have completely resolved with supportive care follow-up with PCP in her primary gastroenterologist in the outpatient setting postdischarge.   Uncontrolled HTN Patient on multiple medications however was not taking her hydralazine which has been resumed with good effect, of note she was on 2 different diuretics Lasix and HCTZ, HCTZ has been discontinued PCP to monitor blood pressure, BMP closely.   History of CVA Chronic left ICA occlusion Right carotid artery stenosis-s/p TCAR July 2023 Continue aspirin/Plavix/statin Thankfully carotid ultrasound was stable although MRI of the neck was questionable, she was seen by vascular surgeon Dr. Unk Lightning who recommends to continue present medical care with outpatient  follow-up with him after 2 to 3 months of discharge.  She is already on DAPT and statin which will be continued PCP to monitor.   CKD stage IV At baseline   Seizure disorder Keppra   Bronchial asthma Not in exacerbation As needed bronchodilators   OSA  CPAP nightly   Hypomagnesemia.  Replaced PCP to recheck within a week of discharge.    Obesity: Estimated body mass index is 33 follow-up with PCP for weight loss Discharge diagnosis     Principal Problem:   TIA (transient ischemic attack) Active Problems:   CKD (chronic kidney disease) stage 4, GFR 15-29 ml/min (HCC)   Accelerated hypertension   Vertigo   HLD (hyperlipidemia)   Cerebrovascular accident (CVA) due to occlusion of left carotid artery (HCC)   CAD, multiple vessel    Discharge instructions    Discharge Instructions     Ambulatory Referral for Lung Cancer Scre   Complete  by: As directed    Discharge instructions   Complete by: As directed    Follow with Primary MD Donnajean Lopes, MD in 7 days   Get CBC, CMP, Magnesium, TSH, 2 view Chest X ray -     Activity: As tolerated with Full fall precautions use walker/cane & assistance as needed  Disposition Home   Diet: Heart Healthy - Low Carb, check CBGs QAC-HS  Special Instructions: If you have smoked or chewed Tobacco  in the last 2 yrs please stop smoking, stop any regular Alcohol  and or any Recreational drug use.  On your next visit with your primary care physician please Get Medicines reviewed and adjusted.  Please request your Prim.MD to go over all Hospital Tests and Procedure/Radiological results at the follow up, please get all Hospital records sent to your Prim MD by signing hospital release before you go home.  If you experience worsening of your admission symptoms, develop shortness of breath, life threatening emergency, suicidal or homicidal thoughts you must seek medical attention immediately by calling 911 or calling your MD immediately  if symptoms less severe.  You Must read complete instructions/literature along with all the possible adverse reactions/side effects for all the Medicines you take and that have been prescribed to you. Take any new Medicines after you have completely understood and accpet all the possible adverse reactions/side effects.   Increase activity slowly   Complete by: As directed        Discharge Medications   Allergies as of 01/04/2023       Reactions   Adhesive [tape] Other (See Comments)   "Took off my skin"   Other Rash   "Took off my skin"   Ace Inhibitors Other (See Comments)   Unknown   Sulfa Antibiotics Other (See Comments)   Unknown   Topiramate    unknown   Codeine Other (See Comments)   Altered mental state    Iodinated Contrast Media Rash   Latex Rash        Medication List     STOP taking these medications    metroNIDAZOLE 250  MG tablet Commonly known as: FLAGYL   triamterene-hydrochlorothiazide 37.5-25 MG tablet Commonly known as: MAXZIDE-25       TAKE these medications    acetaminophen 325 MG tablet Commonly known as: TYLENOL Take 650 mg by mouth every 6 (six) hours as needed for mild pain.   albuterol 108 (90 Base) MCG/ACT inhaler Commonly known as: VENTOLIN HFA Inhale 1-2 puffs into the lungs every 6 (six)  hours as needed for wheezing or shortness of breath.   amLODipine 10 MG tablet Commonly known as: NORVASC Take 10 mg by mouth daily.   aspirin 81 MG chewable tablet Chew 1 tablet (81 mg total) by mouth daily.   atorvastatin 40 MG tablet Commonly known as: LIPITOR Take 1 tablet (40 mg total) by mouth daily.   cholestyramine 4 g packet Commonly known as: QUESTRAN Take 0.5 packets by mouth in the morning and at bedtime.   clopidogrel 75 MG tablet Commonly known as: PLAVIX Take 1 tablet (75 mg total) by mouth daily.   [employer] Digestive Probiotic 250 MG capsule Generic drug: saccharomyces boulardii Take 500 mg by mouth daily.   cyanocobalamin 500 MCG tablet Commonly known as: VITAMIN B12 Take 500 mcg by mouth daily.   fluticasone 50 MCG/ACT nasal spray Commonly known as: FLONASE Place 2 sprays into both nostrils daily as needed for allergies.   furosemide 20 MG tablet Commonly known as: LASIX Take 20 mg by mouth daily.   hydrALAZINE 100 MG tablet Commonly known as: APRESOLINE Take 1 tablet (100 mg total) by mouth every 8 (eight) hours.   levETIRAcetam 500 MG tablet Commonly known as: KEPPRA TAKE 1 TABLET BY MOUTH TWICE A DAY   losartan 50 MG tablet Commonly known as: COZAAR Take 50 mg by mouth daily. Total of 150 mg What changed: Another medication with the same name was removed. Continue taking this medication, and follow the directions you see here.   magnesium oxide 400 MG tablet Commonly known as: MAG-OX Take 1 tablet by mouth daily.   meclizine 25 MG tablet Commonly  known as: ANTIVERT Take 0.5 tablets (12.5 mg total) by mouth 3 (three) times daily as needed for dizziness.   metoprolol succinate 100 MG 24 hr tablet Commonly known as: TOPROL-XL Take 100 mg by mouth daily.   mirtazapine 15 MG tablet Commonly known as: REMERON Take 15 mg by mouth at bedtime.   montelukast 10 MG tablet Commonly known as: SINGULAIR Take 10 mg by mouth daily.   nitroGLYCERIN 0.4 MG SL tablet Commonly known as: NITROSTAT Place 1 tablet (0.4 mg total) under the tongue every 5 (five) minutes as needed for chest pain.   ondansetron 4 MG tablet Commonly known as: ZOFRAN Take 1 tablet (4 mg total) by mouth every 6 (six) hours as needed for nausea or vomiting.   pantoprazole 40 MG tablet Commonly known as: PROTONIX Take 1 tablet (40 mg total) by mouth 2 (two) times daily.   Polysaccharide-Iron Complex 150 MG Caps Take 1 tablet by mouth daily.   venlafaxine XR 75 MG 24 hr capsule Commonly known as: EFFEXOR-XR Take 1 capsule (75 mg total) by mouth daily.   Vitamin D (Ergocalciferol) 1.25 MG (50000 UNIT) Caps capsule Commonly known as: DRISDOL Take 50,000 Units by mouth every Wednesday.   Zenpep 40000-126000 units Cpep Generic drug: Pancrelipase (Lip-Prot-Amyl) Take 2 capsules with meals and one capsule with snacks. What changed:  how much to take how to take this when to take this additional instructions               Durable Medical Equipment  (From admission, onward)           Start     Ordered   01/04/23 0940  For home use only DME Walker rolling  Once       Comments: 5 wheel  Question Answer Comment  Walker: With [place]   Patient needs a walker to  treat with the following condition Weakness      01/04/23 0939             Follow-up Information     Donnajean Lopes, MD. Schedule an appointment as soon as possible for a visit in 1 week(s).   Specialty: Internal Medicine Contact information: [address] [address] [phone]         [name], MD. Schedule an appointment as soon as possible for a visit in 2 month(s).   Specialty: Vascular Surgery Contact information: [place] [address] [phone]                 Major procedures and Radiology Reports - PLEASE review detailed and final reports thoroughly  -      VAS US CAROTID  Result Date: 01/03/2023 Carotid Arterial Duplex Study Patient Name:  KAYLAN FAHMY  Date of Exam:   01/03/2023 Medical Rec #: [id number]       Accession #:    [id number] Date of Birth: [birth year]       Patient Gender: F Patient Age:   11 years Exam Location:  [facility] Procedure:      VAS US CAROTID Referring Phys: Oren Binet --------------------------------------------------------------------------------  Indications:       CVA and Numbness. Risk Factors:      Hypertension, coronary artery disease. Comparison Study:  Previous study 05/24/22. Performing Technologist: McKayla Maag RVT, [place]  Examination Guidelines: A complete evaluation includes B-mode imaging, spectral Doppler, color Doppler, and power Doppler as needed of all accessible portions of each vessel. Bilateral testing is considered an integral part of a complete examination. Limited examinations for reoccurring indications may be performed as noted.  Right Carotid Findings: +----------+--------+--------+--------+------------------+--------+           PSV cm/sEDV cm/sStenosisPlaque DescriptionComments +----------+--------+--------+--------+------------------+--------+ CCA Prox  117     19                                         +----------+--------+--------+--------+------------------+--------+ CCA Distal131     20                                         +----------+--------+--------+--------+------------------+--------+ ICA Distal105     25                                          +----------+--------+--------+--------+------------------+--------+ ECA       272     3                                          +----------+--------+--------+--------+------------------+--------+ +----------+--------+-------+--------+-------------------+           PSV cm/sEDV cmsDescribeArm Pressure (mmHG) +----------+--------+-------+--------+-------------------+ [id number]            Stenotic                    +----------+--------+-------+--------+-------------------+ +---------+--------+--+--------+--+---------+ VertebralPSV cm/s34EDV cm/s13Antegrade +---------+--------+--+--------+--+---------+  Right Stent(s): +---------------+---+--++++ Prox to Stent  10817 +---------------+---+--++++ Proximal Stent 11620 +---------------+---+--++++ Mid Stent      12616 +---------------+---+--++++ Distal Stent  [id number] +---------------+---+--++++ Distal to [phone] +---------------+---+--++++    Summary: Right Carotid: Patent stent with no evidence of stenosis. Vertebrals:  Right vertebral artery demonstrates antegrade flow. Subclavians: Right subclavian artery was stenotic. *See table(s) above for measurements and observations.     Preliminary    MR BRAIN WO CONTRAST  Result Date: 01/02/2023 CLINICAL DATA:  Neuro deficit, acute, stroke suspected EXAM: MRI HEAD WITHOUT AND WITH CONTRAST MRA HEAD WITHOUT CONTRAST MRA NECK WITHOUT AND WITH CONTRAST TECHNIQUE: Multiplanar, multiecho pulse sequences of the brain and surrounding structures were obtained without and with intravenous contrast. Angiographic images of the Circle of Willis were obtained using MRA technique without intravenous contrast. Angiographic images of the neck were obtained using MRA technique without and with intravenous contrast. Carotid stenosis measurements (when applicable) are obtained utilizing NASCET criteria, using the distal internal carotid diameter as the denominator. COMPARISON:   CTA head/neck 04/16/2022. FINDINGS: MRI HEAD FINDINGS Brain: No acute infarction, hemorrhage, hydrocephalus, extra-axial collection or mass lesion. Scattered T2/FLAIR hyperintensities white matter are nonspecific but compatible with chronic microvascular disease. No pathologic enhancement. Vascular: See below. Skull and upper cervical spine: Normal marrow signal. Sinuses/Orbits: Small amount of fluid in left maxillary sinus. Otherwise, clear sinuses. No acute orbital findings. Other: No mastoid effusions. MRA HEAD FINDINGS Anterior circulation: Absence of flow related signal in the left intracranial ICA to the level the carotid terminus, due to more proximal occlusion. Bilateral MCAs and ACAs are patent without proximal high-grade stenosis. Chronically hypoplastic left A1 ACA. Posterior circulation: Bilateral intradural vertebral arteries, basilar artery and bilateral posterior cerebral arteries are patent without proximal high-grade stenosis. MRA NECK FINDINGS Aorta: Great vessel origins are patent. Limited evaluation due to artifact. Right carotid: Persistent severe stenosis of the right ICA proximally and persistent occlusion of the left ICA in the upper neck. Vertebral arteries: Patent bilaterally without high-grade stenosis. IMPRESSION: 1. No evidence of acute intracranial abnormality. 2. Persistent severe stenosis of the right ICA proximally in the neck and persistent occlusion of the left ICA in the neck with distal reconstitution at the carotid terminus. A CTA could better quantify if clinically warranted. Electronically Signed   By: Margaretha Sheffield M.D.   On: 01/02/2023 17:48   MR ANGIO HEAD WO W CONTRAST  Result Date: 01/02/2023 CLINICAL DATA:  Neuro deficit, acute, stroke suspected EXAM: MRI HEAD WITHOUT AND WITH CONTRAST MRA HEAD WITHOUT CONTRAST MRA NECK WITHOUT AND WITH CONTRAST TECHNIQUE: Multiplanar, multiecho pulse sequences of the brain and surrounding structures were obtained without and  with intravenous contrast. Angiographic images of the Circle of Willis were obtained using MRA technique without intravenous contrast. Angiographic images of the neck were obtained using MRA technique without and with intravenous contrast. Carotid stenosis measurements (when applicable) are obtained utilizing NASCET criteria, using the distal internal carotid diameter as the denominator. COMPARISON:  CTA head/neck 04/16/2022. FINDINGS: MRI HEAD FINDINGS Brain: No acute infarction, hemorrhage, hydrocephalus, extra-axial collection or mass lesion. Scattered T2/FLAIR hyperintensities white matter are nonspecific but compatible with chronic microvascular disease. No pathologic enhancement. Vascular: See below. Skull and upper cervical spine: Normal marrow signal. Sinuses/Orbits: Small amount of fluid in left maxillary sinus. Otherwise, clear sinuses. No acute orbital findings. Other: No mastoid effusions. MRA HEAD FINDINGS Anterior circulation: Absence of flow related signal in the left intracranial ICA to the level the carotid terminus, due to more proximal occlusion. Bilateral MCAs and ACAs are patent without proximal high-grade stenosis. Chronically hypoplastic left A1 ACA. Posterior circulation: Bilateral intradural vertebral arteries, basilar artery and bilateral  posterior cerebral arteries are patent without proximal high-grade stenosis. MRA NECK FINDINGS Aorta: Great vessel origins are patent. Limited evaluation due to artifact. Right carotid: Persistent severe stenosis of the right ICA proximally and persistent occlusion of the left ICA in the upper neck. Vertebral arteries: Patent bilaterally without high-grade stenosis. IMPRESSION: 1. No evidence of acute intracranial abnormality. 2. Persistent severe stenosis of the right ICA proximally in the neck and persistent occlusion of the left ICA in the neck with distal reconstitution at the carotid terminus. A CTA could better quantify if clinically warranted.  Electronically Signed   By: Margaretha Sheffield M.D.   On: 01/02/2023 17:48   MR Angiogram Neck W or Wo Contrast  Result Date: 01/02/2023 CLINICAL DATA:  Neuro deficit, acute, stroke suspected EXAM: MRI HEAD WITHOUT AND WITH CONTRAST MRA HEAD WITHOUT CONTRAST MRA NECK WITHOUT AND WITH CONTRAST TECHNIQUE: Multiplanar, multiecho pulse sequences of the brain and surrounding structures were obtained without and with intravenous contrast. Angiographic images of the Circle of Willis were obtained using MRA technique without intravenous contrast. Angiographic images of the neck were obtained using MRA technique without and with intravenous contrast. Carotid stenosis measurements (when applicable) are obtained utilizing NASCET criteria, using the distal internal carotid diameter as the denominator. COMPARISON:  CTA head/neck 04/16/2022. FINDINGS: MRI HEAD FINDINGS Brain: No acute infarction, hemorrhage, hydrocephalus, extra-axial collection or mass lesion. Scattered T2/FLAIR hyperintensities white matter are nonspecific but compatible with chronic microvascular disease. No pathologic enhancement. Vascular: See below. Skull and upper cervical spine: Normal marrow signal. Sinuses/Orbits: Small amount of fluid in left maxillary sinus. Otherwise, clear sinuses. No acute orbital findings. Other: No mastoid effusions. MRA HEAD FINDINGS Anterior circulation: Absence of flow related signal in the left intracranial ICA to the level the carotid terminus, due to more proximal occlusion. Bilateral MCAs and ACAs are patent without proximal high-grade stenosis. Chronically hypoplastic left A1 ACA. Posterior circulation: Bilateral intradural vertebral arteries, basilar artery and bilateral posterior cerebral arteries are patent without proximal high-grade stenosis. MRA NECK FINDINGS Aorta: Great vessel origins are patent. Limited evaluation due to artifact. Right carotid: Persistent severe stenosis of the right ICA proximally and  persistent occlusion of the left ICA in the upper neck. Vertebral arteries: Patent bilaterally without high-grade stenosis. IMPRESSION: 1. No evidence of acute intracranial abnormality. 2. Persistent severe stenosis of the right ICA proximally in the neck and persistent occlusion of the left ICA in the neck with distal reconstitution at the carotid terminus. A CTA could better quantify if clinically warranted. Electronically Signed   By: Margaretha Sheffield M.D.   On: 01/02/2023 17:48   CT ABDOMEN PELVIS WO CONTRAST  Result Date: 01/02/2023 CLINICAL DATA:  Generalized weakness, abdominal pain EXAM: CT ABDOMEN AND PELVIS WITHOUT CONTRAST TECHNIQUE: Multidetector CT imaging of the abdomen and pelvis was performed following the standard protocol without IV contrast. Unenhanced CT was performed per clinician order. Lack of IV contrast limits sensitivity and specificity, especially for evaluation of abdominal/pelvic solid viscera. RADIATION DOSE REDUCTION: This exam was performed according to the departmental dose-optimization program which includes automated exposure control, adjustment of the mA and/or kV according to patient size and/or use of iterative reconstruction technique. COMPARISON:  11/16/2020 FINDINGS: Lower chest: No acute pleural or parenchymal lung disease. Hepatobiliary: Unremarkable unenhanced appearance of the liver and gallbladder. No biliary duct dilation. Pancreas: Unremarkable unenhanced appearance. Spleen: Unremarkable unenhanced appearance. Adrenals/Urinary Tract: The adrenals are unremarkable. No urinary tract calculi or obstructive uropathy within either kidney. Bladder is unremarkable. Stomach/Bowel: No bowel  obstruction or ileus. Postsurgical changes from right hemicolectomy with reanastomosis in the lower central abdomen. No bowel wall thickening or inflammatory change. Small hiatal hernia. Vascular/Lymphatic: Aortic atherosclerosis. No enlarged abdominal or pelvic lymph nodes.  Reproductive: Status post hysterectomy. No adnexal masses. Other: No free fluid or free intraperitoneal gas. Prior midline laparotomy, with 2 fat containing peri-incisional midline ventral hernias. No bowel herniation. Musculoskeletal: No acute or destructive bony lesions. Prominent spondylosis at the L2-3 level. Reconstructed images demonstrate no additional findings. IMPRESSION: 1. Unremarkable unenhanced CT of the abdomen and pelvis. No urinary tract calculi or obstructive uropathy. 2. Postsurgical changes from midline laparotomy, with fat containing peri-incisional ventral hernias. No bowel herniation. 3. Small hiatal hernia. 4.  Aortic Atherosclerosis (ICD10-I70.0). Electronically Signed   By: Randa Ngo M.D.   On: 01/02/2023 15:27   DG Chest 2 View  Result Date: 01/02/2023 CLINICAL DATA:  Denies weakness and intermittent dizziness for 3 weeks. EXAM: CHEST - 2 VIEW COMPARISON:  AP chest 04/08/2022 and 12/25/2021 FINDINGS: Cardiac silhouette and mediastinal contours are within limits. Mild-to-moderate calcification within aortic arch. Overlying breast tissue contributes to homogeneous density overlying the lower lungs. No focal airspace opacity. No pleural effusion artifacts. Moderate multilevel degenerative disc changes of the thoracic spine. IMPRESSION: No active cardiopulmonary disease. Electronically Signed   By: Yvonne Kendall M.D.   On: 01/02/2023 12:43   CT Head Wo Contrast  Result Date: 01/02/2023 CLINICAL DATA:  Dizziness, difficulty with speech EXAM: CT HEAD WITHOUT CONTRAST TECHNIQUE: Contiguous axial images were obtained from the base of the skull through the vertex without intravenous contrast. RADIATION DOSE REDUCTION: This exam was performed according to the departmental dose-optimization program which includes automated exposure control, adjustment of the mA and/or kV according to patient size and/or use of iterative reconstruction technique. COMPARISON:  04/16/2022 FINDINGS: Brain:  The brainstem, cerebellum, cerebral peduncles, thalami, basal ganglia, basilar cisterns, and ventricular system appear within normal limits. Periventricular white matter and corona radiata hypodensities favor chronic ischemic microvascular white matter disease. No intracranial hemorrhage, mass lesion, or acute CVA. Vascular: There is atherosclerotic calcification of the cavernous carotid arteries bilaterally. Skull: Unremarkable Sinuses/Orbits: Chronic bilateral maxillary and ethmoid sinusitis. Small left mastoid effusion Other: No supplemental non-categorized findings. IMPRESSION: 1. No acute intracranial findings. 2. Periventricular white matter and corona radiata hypodensities favor chronic ischemic microvascular white matter disease. 3. Atherosclerosis. 4. Chronic bilateral maxillary and ethmoid sinusitis. Small left mastoid effusion. Electronically Signed   By: Van Clines M.D.   On: 01/02/2023 12:07    Micro Results    No results found for this or any previous visit (from the past 240 hour(s)).  Today   Subjective    [name] today has no headache,no chest abdominal pain,no new weakness tingling or numbness, feels much better wants to go home today.    Objective   Blood pressure 112/89, pulse 88, temperature 98 F (36.7 C), temperature source Oral, resp. rate 19, height 5\' 3"  (1.6 m), weight 85.6 kg, SpO2 93 %.   Intake/Output Summary (Last 24 hours) at 01/04/2023 0943 Last data filed at 01/03/2023 1900 Gross per 24 hour  Intake --  Output 500 ml  Net -500 ml    Exam  Awake Alert, No new F.N deficits,    [place].AT,PERRAL Supple Neck,   Symmetrical Chest wall movement, Good air movement bilaterally, CTAB RRR,No Gallops,   +ve B.Sounds, Abd Soft, Non tender,  No Cyanosis, Clubbing or edema    Data Review   Recent Labs  Lab  01/02/23 1044 01/04/23 0257  WBC 8.2 8.3  HGB 11.9* 12.3  HCT 37.9 36.9  PLT 214 212  MCV 96.7 92.3  MCH 30.4 30.8  MCHC 31.4 33.3  RDW  13.2 13.5  LYMPHSABS 2.6 1.7  MONOABS 0.8 0.6  EOSABS 0.5 0.2  BASOSABS 0.1 0.1    Recent Labs  Lab 01/02/23 1044 01/02/23 1045 01/02/23 1823 01/04/23 0257  NA 135  --   --  133*  K 4.6  --   --  4.2  CL 105  --   --  102  CO2 21*  --   --  22  ANIONGAP 9  --   --  9  GLUCOSE 107*  --   --  107*  BUN 21  --   --  19  CREATININE 1.88*  --   --  1.61*  AST 22  --   --   --   ALT 15  --   --   --   ALKPHOS 80  --   --   --   BILITOT 0.5  --   --   --   ALBUMIN 3.2*  --   --   --   LATICACIDVEN  --  1.1  --   --   INR 1.1  --   --   --   HGBA1C  --   --  6.5*  --   BNP  --   --   --  109.5*  MG  --   --   --  1.3*  CALCIUM 8.5*  --   --  8.8*     Total Time in preparing paper work, data evaluation and todays exam - 35 minutes  Signature  -    Lala Lund M.D on 01/04/2023 at 9:43 AM   -  To page go to [email or web address]

## 2023-01-04 NOTE — TOC Transition Note (Addendum)
Transition of Care [facility]) - CM/SW Discharge Note   Patient Details  Name: [name] MRN: [id number] Date of Birth: 07/21/1946  [facility] CM/SW Contact:  Carles Collet, RN Phone Number: 01/04/2023, 10:01 AM   Clinical Narrative:     RW ordered, it will be delivered to the room for DC.  Referral has already been made for OT therapies.  No other TOC needs identified for DC.   Final next level of care: Home/Self Care Barriers to Discharge: No Barriers Identified   Patient Goals and CMS Choice      Discharge Placement                         Discharge Plan and Services Additional resources added to the After Visit Summary for                  DME Arranged: [facility]- [employer] Date DME Agency Contacted: 01/04/23 Time DME Agency Contacted: [place] Determinants of Health ([place]) Interventions SDOH Screenings   Food Insecurity: No Food Insecurity (01/02/2023)  Tobacco Use: Medium Risk (01/02/2023)     Readmission Risk Interventions     No data to display

## 2023-01-04 NOTE — Progress Notes (Signed)
Waiting for patient's ride to arrive.

## 2023-01-04 NOTE — Plan of Care (Signed)

## 2023-01-04 NOTE — Progress Notes (Signed)
Refused cpap, states she does not wear one.

## 2023-01-13 ENCOUNTER — Telehealth: Payer: Self-pay | Admitting: Vascular Surgery

## 2023-01-13 NOTE — Telephone Encounter (Signed)
-----   Message from [name], MD sent at 01/07/2023 12:12 PM EDT ----- 6 month followup sounds good.  No clue why she was getting an aortoiliac. Needs carotid and left leg arterial duplex with ABI  Can hold on the carotid at this time. Happy to still see in May  ----- Message ----- From: Garrel Ridgel Sent: 01/07/2023   9:09 AM EDT To: [name], MD  Hello,  This patient has a recall to come back in May for a f/u with Aorto Iliac Rt+ABI+B/L Carotid. Would you like me to schedule all together or separate the carotid U/S?  Thank you, Verdis Frederickson ----- Message ----- From: [name], MD Sent: 01/03/2023  11:43 PM EDT To: [facility] Admin Pool  6 month follow up with bilateral carotid study please

## 2023-01-14 DIAGNOSIS — E2839 Other primary ovarian failure: Secondary | ICD-10-CM | POA: Diagnosis not present

## 2023-01-14 DIAGNOSIS — I130 Hypertensive heart and chronic kidney disease with heart failure and stage 1 through stage 4 chronic kidney disease, or unspecified chronic kidney disease: Secondary | ICD-10-CM | POA: Diagnosis not present

## 2023-01-14 DIAGNOSIS — J449 Chronic obstructive pulmonary disease, unspecified: Secondary | ICD-10-CM | POA: Diagnosis not present

## 2023-01-14 DIAGNOSIS — E785 Hyperlipidemia, unspecified: Secondary | ICD-10-CM | POA: Diagnosis not present

## 2023-01-14 DIAGNOSIS — I63511 Cerebral infarction due to unspecified occlusion or stenosis of right middle cerebral artery: Secondary | ICD-10-CM | POA: Diagnosis not present

## 2023-01-14 DIAGNOSIS — I2510 Atherosclerotic heart disease of native coronary artery without angina pectoris: Secondary | ICD-10-CM | POA: Diagnosis not present

## 2023-01-14 DIAGNOSIS — E871 Hypo-osmolality and hyponatremia: Secondary | ICD-10-CM | POA: Diagnosis not present

## 2023-01-14 DIAGNOSIS — Z932 Ileostomy status: Secondary | ICD-10-CM | POA: Diagnosis not present

## 2023-01-14 DIAGNOSIS — I5040 Unspecified combined systolic (congestive) and diastolic (congestive) heart failure: Secondary | ICD-10-CM | POA: Diagnosis not present

## 2023-01-14 DIAGNOSIS — E1151 Type 2 diabetes mellitus with diabetic peripheral angiopathy without gangrene: Secondary | ICD-10-CM | POA: Diagnosis not present

## 2023-01-14 DIAGNOSIS — D509 Iron deficiency anemia, unspecified: Secondary | ICD-10-CM | POA: Diagnosis not present

## 2023-01-14 DIAGNOSIS — E291 Testicular hypofunction: Secondary | ICD-10-CM | POA: Diagnosis not present

## 2023-01-14 DIAGNOSIS — N184 Chronic kidney disease, stage 4 (severe): Secondary | ICD-10-CM | POA: Diagnosis not present

## 2023-01-14 DIAGNOSIS — G2581 Restless legs syndrome: Secondary | ICD-10-CM | POA: Diagnosis not present

## 2023-01-14 DIAGNOSIS — I129 Hypertensive chronic kidney disease with stage 1 through stage 4 chronic kidney disease, or unspecified chronic kidney disease: Secondary | ICD-10-CM | POA: Diagnosis not present

## 2023-01-15 DIAGNOSIS — M1712 Unilateral primary osteoarthritis, left knee: Secondary | ICD-10-CM | POA: Diagnosis not present

## 2023-01-20 ENCOUNTER — Other Ambulatory Visit: Payer: Self-pay | Admitting: Cardiology

## 2023-01-21 ENCOUNTER — Telehealth: Payer: Self-pay | Admitting: Physician Assistant

## 2023-01-21 NOTE — Telephone Encounter (Signed)
Patient called stating that Zofran is not working for her and is making her feel more sick. Scheduled for a FU on 03/26/2023 regarding medication. Would still like to speak with someone to see what she can do before the appointment. Please advise.

## 2023-01-21 NOTE — Telephone Encounter (Signed)
Called patient. No answer. No voicemail. 

## 2023-01-22 NOTE — H&P (View-Only) (Signed)
 Cardiology Office Note    Date:  01/23/2023   ID:  [name], DOB 10/01/1946, MRN [id number]  PCP:  Paterson, Daniel G, MD  Cardiologist:  Dr. Eldonna Neuenfeldt  Chief Complaint  Patient presents with   Follow-up   Coronary Artery Disease   Chest Pain    History of Present Illness:  [name] is a 77 y.o. female with PMH of carotid artery disease with known occlusion of left ICA, CAD, DM 2, diabetes insipidus, dizziness, HTN, GERD, chronic pancreatitis, CVA, PAD with stent to the left SFA, and obstructive sleep apnea. She has known history of coronary artery disease with 60-70% stenosis in mid LAD. Cardiac catheterization in 1999, 2002, 2006, 2012 showed stable anatomy.    Myoview 2020  showed normal perfusion. EF by Myoview and Echo was mildly impaired with EF 45-50%.   She was admitted in November 2020 with AKI due to high output ileostomy and ARB. In January 2021 she was admitted with acute respiratory failure and encephalopathy related to Covid 19 PNA. She did have  bowel resection x 3 for bowel obstruction 2 years ago.   She is status post exploratory laparotomy, lysis of adhesions, end ileostomy takedown, small bowel resection and ileocolonic anastomosis on 03/23/21 in [place].  Pt presented to ED in June 2023 with c/o generalized weakness over past couple of days mild nausea, in the ER she was found to have extremely elevated blood pressures.  MRI brain was unremarkable, CTA head and neck was nonacute except some nonspecific chest findings for which it was recommended a repeat CT chest in 3 weeks by PCP.    She was readmitted on April 15, 2022 when she presented  with strokelike symptoms having visited her PCP with right arm numbness slurred speech and given her known history and risk factors she was sent to the ED for further evaluation and treatment.  Neurology consulted -imaging initially unremarkable however CTA and transcranial doppler shows a string sign concerning for  imminent vascular compromise prompting vascular consultation and subsequent right TCAR 7/6 by Dr. Robins. EEG was negative. DC on ASA and Plavix.   She was admitted in March this year with symptoms of vertigo and nausea with abdominal pain. Extensive evaluation negative. BP elevated but improved with resuming hydralazine. MRI did suggest significant RICA stenosis but was seen by Dr Robins in follow up. He felt doppler was OK and recommended follow up in 6 months.  On follow up today she is seen with her son. She notes over the past month progressive mid sternal chest pain with any exertion. Relieved with rest after 5-10 minutes. States it has gotten worse. Hasn't tried Ntg. Quit smoking several years ago. Is  SOB.      Past Medical History:  Diagnosis Date   Anxiety    Arthritis    back    Blind loop syndrome    Clotting disorder    Hx Clot in leg per pt    Colostomy present    Coronary artery disease    a. known 60-70% mid-LAD stenosis with caths in 1999, 2002, 2006, and 2012 showing stable anatomy b. low-risk NST in 10/2015   Depression    Diabetes insipidus    Diabetes mellitus    Diverticulosis    Dizziness    chronic   Dyslipidemia    Fatty liver    GERD (gastroesophageal reflux disease)    hiatial hernia   Heart murmur    Hiatal hernia      Hypertension    Irritable bowel syndrome    Pancreatitis, chronic    Peripheral vascular disease 02/20/2010   s/p stent of left SFA; carotid stenosis   Seizure 11/04/2018   last 1 yr 2020 per pt unsure month    Sleep apnea    no cpap    Stroke    Thyroid nodule    Vitamin B12 deficiency     Past Surgical History:  Procedure Laterality Date   ABDOMINAL HYSTERECTOMY     partial   AUGMENTATION MAMMAPLASTY     bilateral 1976 retro    BREAST ENHANCEMENT SURGERY     CARDIAC CATHETERIZATION  07/07/98;08/31/01;03/04/05   60 -70% LAD   CATARACT EXTRACTION Bilateral    COLON RESECTION N/A 10/14/2017   Procedure: EXPLORATORY  LAPAROTOMY, EXTENDED RIGHT COLECTOMY; APPLICATION OF ABDOMINAL VACUUM DRESSING;  Surgeon: Toth, Paul III, MD;  Location: [facility];  Service: General;  Laterality: N/A;   COLONOSCOPY     ENDOBRONCHIAL ULTRASOUND Bilateral 02/24/2017   Procedure: ENDOBRONCHIAL ULTRASOUND;  Surgeon: Byrum, Robert S, MD;  Location: [facility];  Service: Cardiopulmonary;  Laterality: Bilateral;   FEMORAL ARTERY STENT     Left leg   LAPAROTOMY N/A 10/16/2017   Procedure: EXPLORATORY LAPAROTOMY, PARTIAL OMENTECTOMY, RESECTION ISCHEMIC ILEUM 130CM, APPLICATION OF VAC ABDOMINAL DRESSING;  Surgeon: Gerkin, Todd, MD;  Location: [facility];  Service: General;  Laterality: N/A;   LAPAROTOMY N/A 10/18/2017   Procedure: RE-EXPLORATION OF ABDOMEN, ILEOSTOMY CREATION;  Surgeon: Byerly, Faera, MD;  Location: [facility];  Service: General;  Laterality: N/A;   LUNG SURGERY  03/2017   Benign polyps removed   PATELLA REALIGNMENT Left    POLYPECTOMY     SIGMOIDOSCOPY     TONSILLECTOMY     TRANSCAROTID ARTERY REVASCULARIZATION  Right 04/18/2022   Procedure: Transcarotid Artery Revascularization;  Surgeon: Robins, Joshua E, MD;  Location: [facility];  Service: Vascular;  Laterality: Right;    Current Medications: Outpatient Medications Prior to Visit  Medication Sig Dispense Refill   acetaminophen (TYLENOL) 325 MG tablet Take 650 mg by mouth every 6 (six) hours as needed for mild pain.     albuterol (VENTOLIN HFA) 108 (90 Base) MCG/ACT inhaler Inhale 1-2 puffs into the lungs every 6 (six) hours as needed for wheezing or shortness of breath.     amLODipine (NORVASC) 10 MG tablet Take 10 mg by mouth daily.     aspirin 81 MG chewable tablet Chew 1 tablet (81 mg total) by mouth daily.     atorvastatin (LIPITOR) 40 MG tablet Take 1 tablet (40 mg total) by mouth daily. 30 tablet 0   cholestyramine (QUESTRAN) 4 Strong packet Take 0.5 packets by mouth in the morning and at bedtime.     clopidogrel (PLAVIX) 75 MG tablet Take 1 tablet (75 mg total) by mouth  daily. 30 tablet 0   [employer] DIGESTIVE PROBIOTIC 250 MG capsule Take 500 mg by mouth daily.     fluticasone (FLONASE) 50 MCG/ACT nasal spray Place 2 sprays into both nostrils daily as needed for allergies.     furosemide (LASIX) 20 MG tablet Take 20 mg by mouth daily.     hydrALAZINE (APRESOLINE) 100 MG tablet Take 1 tablet (100 mg total) by mouth every 8 (eight) hours. 90 tablet 0   levETIRAcetam (KEPPRA) 500 MG tablet TAKE 1 TABLET BY MOUTH TWICE A DAY (Patient taking differently: Take 500 mg by mouth 2 (two) times daily.) 180 tablet 2   losartan (COZAAR) 50 MG tablet TAKE   ONE TABLET BY MOUTH EVERY DAY 30 tablet 11   magnesium oxide (MAG-OX) 400 MG tablet Take 1 tablet by mouth daily.     meclizine (ANTIVERT) 25 MG tablet Take 0.5 tablets (12.5 mg total) by mouth 3 (three) times daily as needed for dizziness. 30 tablet 0   metoprolol succinate (TOPROL-XL) 100 MG 24 hr tablet Take 100 mg by mouth daily.     mirtazapine (REMERON) 15 MG tablet Take 15 mg by mouth at bedtime.     montelukast (SINGULAIR) 10 MG tablet Take 10 mg by mouth daily.     ondansetron (ZOFRAN) 4 MG tablet Take 1 tablet (4 mg total) by mouth every 6 (six) hours as needed for nausea or vomiting. 40 tablet 1   Pancrelipase, Lip-Prot-Amyl, (ZENPEP) 40000-126000 units CPEP Take 2 capsules with meals and one capsule with snacks. (Patient taking differently: Take 1 capsule by mouth daily with breakfast.) 240 capsule 0   pantoprazole (PROTONIX) 40 MG tablet Take 1 tablet (40 mg total) by mouth 2 (two) times daily. 180 tablet 2   Polysaccharide-Iron Complex 150 MG CAPS Take 1 tablet by mouth daily.     venlafaxine XR (EFFEXOR-XR) 75 MG 24 hr capsule Take 1 capsule (75 mg total) by mouth daily.     vitamin B-12 (CYANOCOBALAMIN) 500 MCG tablet Take 500 mcg by mouth daily.     Vitamin D, Ergocalciferol, (DRISDOL) 1.25 MG (50000 UT) CAPS capsule Take 50,000 Units by mouth every Wednesday.      nitroGLYCERIN (NITROSTAT) 0.4 MG SL tablet Place  1 tablet (0.4 mg total) under the tongue every 5 (five) minutes as needed for chest pain. 90 tablet 3   No facility-administered medications prior to visit.     Allergies:   Adhesive [tape], Other, Ace inhibitors, Sulfa antibiotics, Topiramate, Codeine, Iodinated contrast media, and Latex   Social History   Socioeconomic History   Marital status: Divorced    Spouse name: Not on file   Number of children: 2   Years of education: Not on file   Highest education level: Not on file  Occupational History   Occupation: retired    Employer: DISABLED  Tobacco Use   Smoking status: Former    Packs/day: 1.00    Years: 50.00    Additional pack years: 0.00    Total pack years: 50.00    Types: Cigarettes    Quit date: 10/14/2010    Years since quitting: 12.2   Smokeless tobacco: Never  Vaping Use   Vaping Use: Never used  Substance and Sexual Activity   Alcohol use: No   Drug use: No   Sexual activity: Not Currently    Comment: Hysterectomy  Other Topics Concern   Not on file  Social History Narrative   Not on file   Social Determinants of Health   Financial Resource Strain: Not on file  Food Insecurity: No Food Insecurity (01/02/2023)   Hunger Vital Sign    Worried About Running Out of Food in the Last Year: Never true    Ran Out of Food in the Last Year: Never true  Transportation Needs: Not on file  Physical Activity: Not on file  Stress: Not on file  Social Connections: Not on file     Family History:  The patient's family history includes Colon cancer in her paternal grandfather; Hypertension in her father and mother; Osteoarthritis in her mother; Pulmonary embolism in her mother; Stroke in her father and another family member.   ROS:     Please see the history of present illness.    ROS All other systems reviewed and are negative.   PHYSICAL EXAM:   VS:  BP (!) 146/82   Pulse 70   Ht 5' 3" (1.6 m)   Wt 171 lb 3.2 oz (77.7 kg)   SpO2 96%   BMI 30.33 kg/m    GEN:  Well nourished, obese, in no acute distress  HEENT: normal  Neck: no JVD, right carotid bruit, or masses Cardiac: RRR; no murmurs, rubs, or gallops,no edema  Respiratory:  clear to auscultation bilaterally, normal work of breathing GI: soft, nontender, nondistended, + BS MS: no deformity or atrophy  Skin: warm and dry, no rash, no edema. Pulses 2+ equal.  Neuro:  Alert and Oriented x 3, Strength and sensation are intact Psych: euthymic mood, full affect  Wt Readings from Last 3 Encounters:  01/23/23 171 lb 3.2 oz (77.7 kg)  01/02/23 188 lb 11.4 oz (85.6 kg)  08/15/22 169 lb 4 oz (76.8 kg)      Studies/Labs Reviewed:   EKG:  EKG is not ordered today.  Done 01/02/23. NSR with LVH. Lateral T wave inversion. I have personally reviewed and interpreted this study.   Recent Labs: 01/02/2023: ALT 15 01/04/2023: B Natriuretic Peptide 109.5; BUN 19; Creatinine, Ser 1.61; Hemoglobin 12.3; Magnesium 1.3; Platelets 212; Potassium 4.2; Sodium 133   Lipid Panel    Component Value Date/Time   CHOL 98 01/03/2023 0625   TRIG 119 01/03/2023 0625   HDL 49 01/03/2023 0625   CHOLHDL 2.0 01/03/2023 0625   VLDL 24 01/03/2023 0625   LDLCALC 25 01/03/2023 0625   Dated 05/08/20: creatinine 1.9. otherwise CMET normal. A1c 5.6%.  Dated 09/05/21: cholesterol 107, triglycerides 163, HDL 45, LDL 29. A1c 5.4%. Dated 01/14/23: normal LFTs   Ecg today shows NSR rate 67. Nonspecific TWA. I have personally reviewed and interpreted this study.   Additional studies/ records that were reviewed today include:   Echo 10/25/2017   LV EF: 55% -   60%   ------------------------------------------------------------------- Indications:      CVA 436.   ------------------------------------------------------------------- History:   PMH:   Coronary artery disease.  Risk factors: Hypertension. Diabetes mellitus. Dyslipidemia.   ------------------------------------------------------------------- Study Conclusions    - Left ventricle: The cavity size was normal. Wall thickness was   normal. Systolic function was normal. The estimated ejection   fraction was in the range of 55% to 60%. Although no diagnostic   regional wall motion abnormality was identified, this possibility   cannot be completely excluded on the basis of this study. Doppler   parameters are consistent with abnormal left ventricular   relaxation (grade 1 diastolic dysfunction). - Aortic valve: Poorly visualized. There was no stenosis. - Mitral valve: Mildly calcified annulus. There was trivial   regurgitation. - Right ventricle: The cavity size was normal. Systolic function   was normal. - Pulmonary arteries: No complete TR doppler jet so unable to   estimate PA systolic pressure. - Inferior vena cava: The vessel was normal in size. The   respirophasic diameter changes were in the normal range (>= 50%),   consistent with normal central venous pressure.   Impressions:   - Normal LV size with EF 55-60%. Normal RV size and systolic   function. No significant valvular abnormalities.  Myoview 12/18/18: Study Highlights    The left ventricular ejection fraction is mildly decreased (45-54%). Nuclear stress EF: 48%. No T wave inversion was noted during stress. There was no   ST segment deviation noted during stress. This is an intermediate risk study.   No reversible ischemia. LVEF 48% with basal septal dyskinesis. This is an intermediate risk study (LVEF<49%) - recommend correlating findings with echo.   Echo 04/30/19:IMPRESSIONS     1. The left ventricle has mildly reduced systolic function, with an  ejection fraction of 45-50%. The cavity size was normal. There is mildly  increased left ventricular wall thickness. Left ventricular diastolic  Doppler parameters are consistent with  impaired relaxation. Indeterminate filling pressures The E/e' is 8-15.   2. Moderate hypokinesis of the left ventricular, basal-mid inferolateral   wall.   3. The right ventricle has normal systolic function. The cavity was  normal. There is no increase in right ventricular wall thickness.   4. The mitral valve is abnormal. Mild thickening of the mitral valve  leaflet. Mild calcification of the mitral valve leaflet. There is mild  mitral annular calcification present.   5. The tricuspid valve is grossly normal.   6. The aortic valve is grossly normal. No stenosis of the aortic valve.   7. The aortic root and ascending aorta are normal in size and structure.   8. The inferior vena cava was normal in size with <50% respiratory  variability.   9. The average left ventricular global longitudinal strain is -18.6 %.  10. When compared to the prior study: 10/25/17 EF 55-60%.     Echocardiogram 11/17/2020 IMPRESSIONS     1. Left ventricular ejection fraction, by estimation, is 45 to 50%. The  left ventricle has mildly decreased function. The left ventricle  demonstrates regional wall motion abnormalities (see scoring  diagram/findings for description). There is moderate  left ventricular hypertrophy.   2. The mitral valve was not well visualized. No evidence of mitral valve  regurgitation.   3. The aortic valve is tricuspid. Mild aortic valve sclerosis is present,  with no evidence of aortic valve stenosis.   Comparison(s): Prior images reviewed side by side. Changes from prior  study are noted.   Echo 12/26/21: 1. Left ventricular ejection fraction, by estimation, is 45 to 50%. The  left ventricle has mildly decreased function. The left ventricle  demonstrates regional wall motion abnormalities. Apical hypokinesis. There  is mild left ventricular hypertrophy.  Left ventricular diastolic parameters are indeterminate.   2. Right ventricular systolic function is normal. The right ventricular  size is normal. Tricuspid regurgitation signal is inadequate for assessing  PA pressure.   3. The mitral valve is normal in structure. No  evidence of mitral valve  regurgitation.   4. The aortic valve was not well visualized. Aortic valve regurgitation  is not visualized. No aortic stenosis is present.   5. The inferior vena cava is normal in size with greater than 50%  respiratory variability, suggesting right atrial pressure of 3 mmHg.    ASSESSMENT:    1. Coronary artery disease involving native coronary artery of native heart with unstable angina pectoris   2. Right-sided extracranial carotid artery stenosis   3. Essential hypertension   4. Mixed hyperlipidemia   5. PVD (peripheral vascular disease)   6. Unstable angina      PLAN:  In order of problems listed above:  1. CAD: She has progressive/unstable angina over the past month. She is on good medical therapy. On DAPT. Has remote cath showing 60 to 70% stenosis in the LAD on multiple previous cardiac catheterizations.  Myoview in March 2020 showed normal perfusion. Given progressive symptoms I have   recommended proceeding directly to cardiac cath. Given CKD she will come in 3 hours early for hydration. Limit contrast load. Also needs treatment for contrast allergy. The procedure and risks were reviewed including but not limited to death, myocardial infarction, stroke, arrythmias, bleeding, transfusion, emergency surgery, dye allergy, or renal dysfunction. The patient voices understanding and is agreeable to proceed.  2. Hypertension:  on metoprolol, amlodipine, losartan, hydralazine  3. Bilateral carotid artery disease: severe RICA stenosis now s/p TCAR. totally occluded left carotid artery.  Followed closely by Dr Robins at [facility].   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   5. History of ischemic bowel with obstruction. s/p resection and ileostomy with subsequent reanastomosis.   6. OSA- intolerant of CPAP  7. PAD. LE arterial dopplers in Dec looked good.   8. Dyslipidemia. On statin. Excellent control.   Follow up in one year.  Medication Adjustments/Labs  and Tests Ordered: Current medicines are reviewed at length with the patient today.  Concerns regarding medicines are outlined above.  Medication changes, Labs and Tests ordered today are listed in the Patient Instructions below. Patient Instructions  Medication Instructions:  NO CHANGES  *If you need a refill on your cardiac medications before your next appointment, please call your pharmacy*   Lab Work: CBC and BMET today   If you have labs (blood work) drawn today and your tests are completely normal, you will receive your results only by: MyChart Message (if you have MyChart) OR A paper copy in the mail If you have any lab test that is abnormal or we need to change your treatment, we will call you to review the results.   Testing/Procedures: Cardiac Catheterization at [place] with Dr. Ronnie Doo   Follow-Up: At [facility], you and your health needs are our priority.  As part of our continuing mission to provide you with exceptional heart care, we have created designated Provider Care Teams.  These Care Teams include your primary Cardiologist (physician) and Advanced Practice Providers (APPs -  Physician Assistants and Nurse Practitioners) who all work together to provide you with the care you need, when you need it.  We recommend signing up for the patient portal called "MyChart".  Sign up information is provided on this After Visit Summary.  MyChart is used to connect with patients for Virtual Visits (Telemedicine).  Patients are able to view lab/test results, encounter notes, upcoming appointments, etc.  Non-urgent messages can be sent to your provider as well.   To learn more about what you can do with MyChart, go to [email or web address].    Your next appointment:    3-4 weeks with Dr. Mylz Yuan   Other Instructions  [facility] A [facility]PT OF [facility]. [facility] AT [id number]  [place] [address] Dept: [phone] Loc: [phone]  Ariyan H Weyers  01/23/2023  You are scheduled for a Cardiac Catheterization on Wednesday, April 17 with Dr. Lakedra Washington.  1. Please arrive at the [facility] Tower (Main Entrance A) at [facility]: [address] , [place] [address] at 6:30AM (This time is two hours before your procedure to ensure your preparation). Free valet parking service is available. You will check in at ADMITTING. The support person will be asked to wait in the waiting room.  It is OK to have someone drop you off and come back when you are ready to be discharged.    Special note: Every effort   is made to have your procedure done on time. Please understand that emergencies sometimes delay scheduled procedures.  2. Diet: Do not eat solid foods after midnight.  The patient may have clear liquids until 5am upon the day of the procedure.  3. Labs: CBC and BMET today 01/23/23  4. Medication instructions in preparation for your procedure:   Contrast Allergy:  Please take Prednisone 50mg by mouth at: Thirteen hours prior to cath 9:00pm on Tuesday Seven hours prior to cath 3:00am on Wednesday And bring the last dose of Prednisone 50mg and Benadryl 50mg by mouth to take at the hospital    On the morning of your procedure, take your Aspirin 81 mg and Plavix/Clopidogrel and any morning medicines NOT listed above.  You may use sips of water.  5. Plan to go home the same day, you will only stay overnight if medically necessary. 6. Bring a current list of your medications and current insurance cards. 7. You MUST have a responsible person to drive you home. 8. Someone MUST be with you the first 24 hours after you arrive home or your discharge will be delayed. 9. Please wear clothes that are easy to get on and off and wear slip-on shoes.  Thank you for allowing us to care for you!   -- [facility]      Signed, Jamale Spangler, MD  01/23/2023 2:32 PM    [facility] [address], [place], [place]  [address] Phone: [phone]; Fax: [phone]   

## 2023-01-22 NOTE — Progress Notes (Unsigned)
Cardiology Office Note    Date:  01/23/2023   ID:  [name], DOB Jun 13, 1946, MRN [id number]  PCP:  Garlan Fillers, MD  Cardiologist:  Dr. Swazil[place]  Chief Complaint  Patient presents with   Follow-up   Coronary Artery Disease   Chest Pain    History of Present Illness:  [name] is a 77 y.o. female with PMH of carotid artery disease with known occlusion of left ICA, CAD, DM 2, diabetes insipidus, dizziness, HTN, GERD, chronic pancreatitis, CVA, PAD with stent to the left SFA, and obstructive sleep apnea. She has known history of coronary artery disease with 60-70% stenosis in mid LAD. Cardiac catheterization in 1999, 2002, 2006, 2012 showed stable anatomy.    Myoview 2020  showed normal perfusion. EF by Myoview and Echo was mildly impaired with EF 45-50%.   She was admitted in November 2020 with AKI due to high output ileostomy and ARB. In January 2021 she was admitted with acute respiratory failure and encephalopathy related to Covid 19 PNA. She did have  bowel resection x 3 for bowel obstruction 2 years ago.   She is status post exploratory laparotomy, lysis of adhesions, end ileostomy takedown, small bowel resection and ileocolonic anastomosis on 03/23/21 in [place].  Pt presented to ED in June 2023 with c/o generalized weakness over past couple of days mild nausea, in the ER she was found to have extremely elevated blood pressures.  MRI brain was unremarkable, CTA head and neck was nonacute except some nonspecific chest findings for which it was recommended a repeat CT chest in 3 weeks by PCP.    She was readmitted on April 15, 2022 when she presented  with strokelike symptoms having visited her PCP with right arm numbness slurred speech and given her known history and risk factors she was sent to the ED for further evaluation and treatment.  Neurology consulted -imaging initially unremarkable however CTA and transcranial doppler shows a string sign concerning for  imminent vascular compromise prompting vascular consultation and subsequent right TCAR 7/6 by Dr. Karin Lieu. EEG was negative. DC on ASA and Plavix.   She was admitted in March this year with symptoms of vertigo and nausea with abdominal pain. Extensive evaluation negative. BP elevated but improved with resuming hydralazine. MRI did suggest significant RICA stenosis but was seen by Dr Karin Lieu in follow up. He felt doppler was OK and recommended follow up in 6 months.  On follow up today she is seen with her son. She notes over the past month progressive mid sternal chest pain with any exertion. Relieved with rest after 5-10 minutes. States it has gotten worse. Hasn't tried Ntg. Quit smoking several years ago. Is  SOB.      Past Medical History:  Diagnosis Date   Anxiety    Arthritis    back    Blind loop syndrome    Clotting disorder    Hx Clot in leg per pt    Colostomy present    Coronary artery disease    a. known 60-70% mid-LAD stenosis with caths in 1999, 2002, 2006, and 2012 showing stable anatomy b. low-risk NST in 10/2015   Depression    Diabetes insipidus    Diabetes mellitus    Diverticulosis    Dizziness    chronic   Dyslipidemia    Fatty liver    GERD (gastroesophageal reflux disease)    hiatial hernia   Heart murmur    Hiatal hernia  Hypertension    Irritable bowel syndrome    Pancreatitis, chronic    Peripheral vascular disease 02/20/2010   s/p stent of left SFA; carotid stenosis   Seizure 11/04/2018   last 1 yr 2020 per pt unsure month    Sleep apnea    no cpap    Stroke    Thyroid nodule    Vitamin B12 deficiency     Past Surgical History:  Procedure Laterality Date   ABDOMINAL HYSTERECTOMY     partial   AUGMENTATION MAMMAPLASTY     bilateral 1976 retro    BREAST ENHANCEMENT SURGERY     CARDIAC CATHETERIZATION  07/07/98;08/31/01;03/04/05   60 -70% LAD   CATARACT EXTRACTION Bilateral    COLON RESECTION N/A 10/14/2017   Procedure: EXPLORATORY  LAPAROTOMY, EXTENDED RIGHT COLECTOMY; APPLICATION OF ABDOMINAL VACUUM DRESSING;  Surgeon: Griselda Miner, MD;  Location: [facility];  Service: General;  Laterality: N/A;   COLONOSCOPY     ENDOBRONCHIAL ULTRASOUND Bilateral 02/24/2017   Procedure: ENDOBRONCHIAL ULTRASOUND;  Surgeon: Leslye Peer, MD;  Location: [facility];  Service: Cardiopulmonary;  Laterality: Bilateral;   FEMORAL ARTERY STENT     Left leg   LAPAROTOMY N/A 10/16/2017   Procedure: EXPLORATORY LAPAROTOMY, PARTIAL OMENTECTOMY, RESECTION ISCHEMIC ILEUM 130CM, APPLICATION OF VAC ABDOMINAL DRESSING;  Surgeon: Darnell Level, MD;  Location: [facility];  Service: General;  Laterality: N/A;   LAPAROTOMY N/A 10/18/2017   Procedure: RE-EXPLORATION OF ABDOMEN, ILEOSTOMY CREATION;  Surgeon: Almond Lint, MD;  Location: [facility];  Service: General;  Laterality: N/A;   LUNG SURGERY  03/2017   Benign polyps removed   PATELLA REALIGNMENT Left    POLYPECTOMY     SIGMOIDOSCOPY     TONSILLECTOMY     TRANSCAROTID ARTERY REVASCULARIZATION  Right 04/18/2022   Procedure: Transcarotid Artery Revascularization;  Surgeon: Victorino Sparrow, MD;  Location: [facility];  Service: Vascular;  Laterality: Right;    Current Medications: Outpatient Medications Prior to Visit  Medication Sig Dispense Refill   acetaminophen (TYLENOL) 325 MG tablet Take 650 mg by mouth every 6 (six) hours as needed for mild pain.     albuterol (VENTOLIN HFA) 108 (90 Base) MCG/ACT inhaler Inhale 1-2 puffs into the lungs every 6 (six) hours as needed for wheezing or shortness of breath.     amLODipine (NORVASC) 10 MG tablet Take 10 mg by mouth daily.     aspirin 81 MG chewable tablet Chew 1 tablet (81 mg total) by mouth daily.     atorvastatin (LIPITOR) 40 MG tablet Take 1 tablet (40 mg total) by mouth daily. 30 tablet 0   cholestyramine (QUESTRAN) 4 g packet Take 0.5 packets by mouth in the morning and at bedtime.     clopidogrel (PLAVIX) 75 MG tablet Take 1 tablet (75 mg total) by mouth  daily. 30 tablet 0   [employer] DIGESTIVE PROBIOTIC 250 MG capsule Take 500 mg by mouth daily.     fluticasone (FLONASE) 50 MCG/ACT nasal spray Place 2 sprays into both nostrils daily as needed for allergies.     furosemide (LASIX) 20 MG tablet Take 20 mg by mouth daily.     hydrALAZINE (APRESOLINE) 100 MG tablet Take 1 tablet (100 mg total) by mouth every 8 (eight) hours. 90 tablet 0   levETIRAcetam (KEPPRA) 500 MG tablet TAKE 1 TABLET BY MOUTH TWICE A DAY (Patient taking differently: Take 500 mg by mouth 2 (two) times daily.) 180 tablet 2   losartan (COZAAR) 50 MG tablet TAKE  ONE TABLET BY MOUTH EVERY DAY 30 tablet 11   magnesium oxide (MAG-OX) 400 MG tablet Take 1 tablet by mouth daily.     meclizine (ANTIVERT) 25 MG tablet Take 0.5 tablets (12.5 mg total) by mouth 3 (three) times daily as needed for dizziness. 30 tablet 0   metoprolol succinate (TOPROL-XL) 100 MG 24 hr tablet Take 100 mg by mouth daily.     mirtazapine (REMERON) 15 MG tablet Take 15 mg by mouth at bedtime.     montelukast (SINGULAIR) 10 MG tablet Take 10 mg by mouth daily.     ondansetron (ZOFRAN) 4 MG tablet Take 1 tablet (4 mg total) by mouth every 6 (six) hours as needed for nausea or vomiting. 40 tablet 1   Pancrelipase, Lip-Prot-Amyl, (ZENPEP) 40000-126000 units CPEP Take 2 capsules with meals and one capsule with snacks. (Patient taking differently: Take 1 capsule by mouth daily with breakfast.) 240 capsule 0   pantoprazole (PROTONIX) 40 MG tablet Take 1 tablet (40 mg total) by mouth 2 (two) times daily. 180 tablet 2   Polysaccharide-Iron Complex 150 MG CAPS Take 1 tablet by mouth daily.     venlafaxine XR (EFFEXOR-XR) 75 MG 24 hr capsule Take 1 capsule (75 mg total) by mouth daily.     vitamin B-12 (CYANOCOBALAMIN) 500 MCG tablet Take 500 mcg by mouth daily.     Vitamin D, Ergocalciferol, (DRISDOL) 1.25 MG (50000 UT) CAPS capsule Take 50,000 Units by mouth every Wednesday.      nitroGLYCERIN (NITROSTAT) 0.4 MG SL tablet Place  1 tablet (0.4 mg total) under the tongue every 5 (five) minutes as needed for chest pain. 90 tablet 3   No facility-administered medications prior to visit.     Allergies:   Adhesive [tape], Other, Ace inhibitors, Sulfa antibiotics, Topiramate, Codeine, Iodinated contrast media, and Latex   Social History   Socioeconomic History   Marital status: Divorced    Spouse name: Not on file   Number of children: 2   Years of education: Not on file   Highest education level: Not on file  Occupational History   Occupation: retired    Associate Professor: DISABLED  Tobacco Use   Smoking status: Former    Packs/day: 1.00    Years: 50.00    Additional pack years: 0.00    Total pack years: 50.00    Types: Cigarettes    Quit date: 10/14/2010    Years since quitting: 12.2   Smokeless tobacco: Never  Vaping Use   Vaping Use: Never used  Substance and Sexual Activity   Alcohol use: No   Drug use: No   Sexual activity: Not Currently    Comment: Hysterectomy  Other Topics Concern   Not on file  Social History Narrative   Not on file   Social Determinants of Health   Financial Resource Strain: Not on file  Food Insecurity: No Food Insecurity (01/02/2023)   Hunger Vital Sign    Worried About Running Out of Food in the Last Year: Never true    Ran Out of Food in the Last Year: Never true  Transportation Needs: Not on file  Physical Activity: Not on file  Stress: Not on file  Social Connections: Not on file     Family History:  The patient's family history includes Colon cancer in her paternal grandfather; Hypertension in her father and mother; Osteoarthritis in her mother; Pulmonary embolism in her mother; Stroke in her father and another family member.   ROS:  Please see the history of present illness.    ROS All other systems reviewed and are negative.   PHYSICAL EXAM:   VS:  BP (!) 146/82   Pulse 70   Ht  (1.6 m)   Wt 171 lb 3.2 oz (77.7 kg)   SpO2 96%   BMI 30.33 kg/m    GEN:  Well nourished, obese, in no acute distress  HEENT: normal  Neck: no JVD, right carotid bruit, or masses Cardiac: RRR; no murmurs, rubs, or gallops,no edema  Respiratory:  clear to auscultation bilaterally, normal work of breathing GI: soft, nontender, nondistended, + BS MS: no deformity or atrophy  Skin: warm and dry, no rash, no edema. Pulses 2+ equal.  Neuro:  Alert and Oriented x 3, Strength and sensation are intact Psych: euthymic mood, full affect  Wt Readings from Last 3 Encounters:  01/23/23 171 lb 3.2 oz (77.7 kg)  01/02/23 188 lb 11.4 oz (85.6 kg)  08/15/22 169 lb 4 oz (76.8 kg)      Studies/Labs Reviewed:   EKG:  EKG is not ordered today.  Done 01/02/23. NSR with LVH. Lateral T wave inversion. I have personally reviewed and interpreted this study.   Recent Labs: 01/02/2023: ALT 15 01/04/2023: B Natriuretic Peptide 109.5; BUN 19; Creatinine, Ser 1.61; Hemoglobin 12.3; Magnesium 1.3; Platelets 212; Potassium 4.2; Sodium 133   Lipid Panel    Component Value Date/Time   CHOL 98 01/03/2023 0625   TRIG 119 01/03/2023 0625   HDL 49 01/03/2023 0625   CHOLHDL 2.0 01/03/2023 0625   VLDL 24 01/03/2023 0625   LDLCALC 25 01/03/2023 0625   Dated 05/08/20: creatinine 1.9. otherwise CMET normal. A1c 5.6%.  Dated 09/05/21: cholesterol 107, triglycerides 163, HDL 45, LDL 29. A1c 5.4%. Dated 01/14/23: normal LFTs   Ecg today shows NSR rate 67. Nonspecific TWA. I have personally reviewed and interpreted this study.   Additional studies/ records that were reviewed today include:   Echo 10/25/2017   LV EF: 55% -   60%   ------------------------------------------------------------------- Indications:      CVA 436.   ------------------------------------------------------------------- History:   PMH:   Coronary artery disease.  Risk factors: Hypertension. Diabetes mellitus. Dyslipidemia.   ------------------------------------------------------------------- Study Conclusions    - Left ventricle: The cavity size was normal. Wall thickness was   normal. Systolic function was normal. The estimated ejection   fraction was in the range of 55% to 60%. Although no diagnostic   regional wall motion abnormality was identified, this possibility   cannot be completely excluded on the basis of this study. Doppler   parameters are consistent with abnormal left ventricular   relaxation (grade 1 diastolic dysfunction). - Aortic valve: Poorly visualized. There was no stenosis. - Mitral valve: Mildly calcified annulus. There was trivial   regurgitation. - Right ventricle: The cavity size was normal. Systolic function   was normal. - Pulmonary arteries: No complete TR doppler jet so unable to   estimate PA systolic pressure. - Inferior vena cava: The vessel was normal in size. The   respirophasic diameter changes were in the normal range (>= 50%),   consistent with normal central venous pressure.   Impressions:   - Normal LV size with EF 55-60%. Normal RV size and systolic   function. No significant valvular abnormalities.  Myoview 12/18/18: Study Highlights    The left ventricular ejection fraction is mildly decreased (45-54%). Nuclear stress EF: 48%. No T wave inversion was noted during stress. There was no  ST segment deviation noted during stress. This is an intermediate risk study.   No reversible ischemia. LVEF 48% with basal septal dyskinesis. This is an intermediate risk study (LVEF<49%) - recommend correlating findings with echo.   Echo 04/30/19:IMPRESSIONS     1. The left ventricle has mildly reduced systolic function, with an  ejection fraction of 45-50%. The cavity size was normal. There is mildly  increased left ventricular wall thickness. Left ventricular diastolic  Doppler parameters are consistent with  impaired relaxation. Indeterminate filling pressures The E/e' is 8-15.   2. Moderate hypokinesis of the left ventricular, basal-mid inferolateral   wall.   3. The right ventricle has normal systolic function. The cavity was  normal. There is no increase in right ventricular wall thickness.   4. The mitral valve is abnormal. Mild thickening of the mitral valve  leaflet. Mild calcification of the mitral valve leaflet. There is mild  mitral annular calcification present.   5. The tricuspid valve is grossly normal.   6. The aortic valve is grossly normal. No stenosis of the aortic valve.   7. The aortic root and ascending aorta are normal in size and structure.   8. The inferior vena cava was normal in size with <50% respiratory  variability.   9. The average left ventricular global longitudinal strain is -18.6 %.  10. When compared to the prior study: 10/25/17 EF 55-60%.     Echocardiogram 11/17/2020 IMPRESSIONS     1. Left ventricular ejection fraction, by estimation, is 45 to 50%. The  left ventricle has mildly decreased function. The left ventricle  demonstrates regional wall motion abnormalities (see scoring  diagram/findings for description). There is moderate  left ventricular hypertrophy.   2. The mitral valve was not well visualized. No evidence of mitral valve  regurgitation.   3. The aortic valve is tricuspid. Mild aortic valve sclerosis is present,  with no evidence of aortic valve stenosis.   Comparison(s): Prior images reviewed side by side. Changes from prior  study are noted.   Echo 12/26/21: 1. Left ventricular ejection fraction, by estimation, is 45 to 50%. The  left ventricle has mildly decreased function. The left ventricle  demonstrates regional wall motion abnormalities. Apical hypokinesis. There  is mild left ventricular hypertrophy.  Left ventricular diastolic parameters are indeterminate.   2. Right ventricular systolic function is normal. The right ventricular  size is normal. Tricuspid regurgitation signal is inadequate for assessing  PA pressure.   3. The mitral valve is normal in structure. No  evidence of mitral valve  regurgitation.   4. The aortic valve was not well visualized. Aortic valve regurgitation  is not visualized. No aortic stenosis is present.   5. The inferior vena cava is normal in size with greater than 50%  respiratory variability, suggesting right atrial pressure of 3 mmHg.    ASSESSMENT:    1. Coronary artery disease involving native coronary artery of native heart with unstable angina pectoris   2. Right-sided extracranial carotid artery stenosis   3. Essential hypertension   4. Mixed hyperlipidemia   5. PVD (peripheral vascular disease)   6. Unstable angina      PLAN:  In order of problems listed above:  1. CAD: She has progressive/unstable angina over the past month. She is on good medical therapy. On DAPT. Has remote cath showing 60 to 70% stenosis in the LAD on multiple previous cardiac catheterizations.  Myoview in March 2020 showed normal perfusion. Given progressive symptoms I have  recommended proceeding directly to cardiac cath. Given CKD she will come in 3 hours early for hydration. Limit contrast load. Also needs treatment for contrast allergy. The procedure and risks were reviewed including but not limited to death, myocardial infarction, stroke, arrythmias, bleeding, transfusion, emergency surgery, dye allergy, or renal dysfunction. The patient voices understanding and is agreeable to proceed.  2. Hypertension:  on metoprolol, amlodipine, losartan, hydralazine  3. Bilateral carotid artery disease: severe RICA stenosis now s/p TCAR. totally occluded left carotid artery.  Followed closely by Dr Karin Lieuobins at [facility].   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   5. History of ischemic bowel with obstruction. s/p resection and ileostomy with subsequent reanastomosis.   6. OSA- intolerant of CPAP  7. PAD. LE arterial dopplers in Dec looked good.   8. Dyslipidemia. On statin. Excellent control.   Follow up in one year.  Medication Adjustments/Labs  and Tests Ordered: Current medicines are reviewed at length with the patient today.  Concerns regarding medicines are outlined above.  Medication changes, Labs and Tests ordered today are listed in the Patient Instructions below. Patient Instructions  Medication Instructions:  NO CHANGES  *If you need a refill on your cardiac medications before your next appointment, please call your pharmacy*   Lab Work: CBC and BMET today   If you have labs (blood work) drawn today and your tests are completely normal, you will receive your results only by: MyChart Message (if you have MyChart) OR A paper copy in the mail If you have any lab test that is abnormal or we need to change your treatment, we will call you to review the results.   Testing/Procedures: [facility] with Dr. SwazilandJordan   Follow-Up: At [facility], you and your health needs are our priority.  As part of our continuing mission to provide you with exceptional heart care, we have created designated Provider Care Teams.  These Care Teams include your primary Cardiologist (physician) and Advanced Practice Providers (APPs -  Physician Assistants and Nurse Practitioners) who all work together to provide you with the care you need, when you need it.  We recommend signing up for the patient portal called "MyChart".  Sign up information is provided on this After Visit Summary.  MyChart is used to connect with patients for Virtual Visits (Telemedicine).  Patients are able to view lab/test results, encounter notes, upcoming appointments, etc.  Non-urgent messages can be sent to your provider as well.   To learn more about what you can do with MyChart, go to [email or web address].    Your next appointment:    3-4 weeks with Dr. SwazilandJordan   Other Instructions  [facility] [place] [place] [place] [facility] AT [facility] [id number] [facility] [place] [id number] Dept: [phone] Loc: [phone]  Joanna ByarsLinda H Buckingham  01/23/2023  You are scheduled for a Cardiac Catheterization on Wednesday, April 17 with Dr. Reegan Bouffard [place].  1. [facility] at [facility]: [address] [place], [place] [address] at 6:30AM (This time is two hours before your procedure to ensure your preparation). Free valet parking service is available. You will check in at ADMITTING. The support person will be asked to wait in the waiting room.  It is OK to have someone drop you off and come back when you are ready to be discharged.    Special note: Every effort  is made to have your procedure done on time. Please understand that emergencies sometimes delay scheduled procedures.  2. Diet: Do not eat solid foods after midnight.  The patient may have clear liquids until 5am upon the day of the procedure.  3. Labs: CBC and BMET today 01/23/23  4. Medication instructions in preparation for your procedure:   Contrast Allergy:  Please take Prednisone  by mouth at: Thirteen hours prior to cath 9:00pm on Tuesday Seven hours prior to cath 3:00am on Wednesday And bring the last dose of Prednisone  and Benadryl  by mouth to take at the hospital    On the morning of your procedure, take your Aspirin 81 mg and Plavix/Clopidogrel and any morning medicines NOT listed above.  You may use sips of water.  5. Plan to go home the same day, you will only stay overnight if medically necessary. 6. Bring a current list of your medications and current insurance cards. 7. You MUST have a responsible person to drive you home. 8. Someone MUST be with you the first 24 hours after you arrive home or your discharge will be delayed. 9. Please wear clothes that are easy to get on and off and wear slip-on shoes.  Thank you for allowing [place] to care for you!   -- [facility] services      Signed, Koi Yarbro Sw[place], MD  01/23/2023 2:32 PM    [facility] [address] [place], [place], [place]  [address] Phone: [phone]; Fax: [phone]

## 2023-01-22 NOTE — H&P (View-Only) (Signed)
Cardiology Office Note    Date:  01/23/2023   ID:  [name], DOB [birth year], MRN [id number]  PCP:  Garlan Fillers, MD  Cardiologist:  Dr. Swaz[place]  Chief Complaint  Patient presents with   Follow-up   Coronary Artery Disease   Chest Pain    History of Present Illness:  [name] is a 77 y.o. female with PMH of carotid artery disease with known occlusion of left ICA, CAD, DM 2, diabetes insipidus, dizziness, HTN, GERD, chronic pancreatitis, CVA, PAD with stent to the left SFA, and obstructive sleep apnea. She has known history of coronary artery disease with 60-70% stenosis in mid LAD. Cardiac catheterization in 1999, 2002, 2006, 2012 showed stable anatomy.    Myoview 2020  showed normal perfusion. EF by Myoview and Echo was mildly impaired with EF 45-50%.   She was admitted in November 2020 with AKI due to high output ileostomy and ARB. In January 2021 she was admitted with acute respiratory failure and encephalopathy related to Covid 19 PNA. She did have  bowel resection x 3 for bowel obstruction 2 years ago.   She is status post exploratory laparotomy, lysis of adhesions, end ileostomy takedown, small bowel resection and ileocolonic anastomosis on 03/23/21 in [place].  Pt presented to ED in June 2023 with c/o generalized weakness over past couple of days mild nausea, in the ER she was found to have extremely elevated blood pressures.  MRI brain was unremarkable, CTA head and neck was nonacute except some nonspecific chest findings for which it was recommended a repeat CT chest in 3 weeks by PCP.    She was readmitted on April 15, 2022 when she presented  with strokelike symptoms having visited her PCP with right arm numbness slurred speech and given her known history and risk factors she was sent to the ED for further evaluation and treatment.  Neurology consulted -imaging initially unremarkable however CTA and transcranial doppler shows a string sign concerning for  imminent vascular compromise prompting vascular consultation and subsequent right TCAR 7/6 by Dr. Karin Lieu. EEG was negative. DC on ASA and Plavix.   She was admitted in March this year with symptoms of vertigo and nausea with abdominal pain. Extensive evaluation negative. BP elevated but improved with resuming hydralazine. MRI did suggest significant RICA stenosis but was seen by Dr Karin Lieu in follow up. He felt doppler was OK and recommended follow up in 6 months.  On follow up today she is seen with her son. She notes over the past month progressive mid sternal chest pain with any exertion. Relieved with rest after 5-10 minutes. States it has gotten worse. Hasn't tried Ntg. Quit smoking several years ago. Is  SOB.      Past Medical History:  Diagnosis Date   Anxiety    Arthritis    back    Blind loop syndrome    Clotting disorder    Hx Clot in leg per pt    Colostomy present    Coronary artery disease    a. known 60-70% mid-LAD stenosis with caths in 1999, 2002, 2006, and 2012 showing stable anatomy b. low-risk NST in 10/2015   Depression    Diabetes insipidus    Diabetes mellitus    Diverticulosis    Dizziness    chronic   Dyslipidemia    Fatty liver    GERD (gastroesophageal reflux disease)    hiatial hernia   Heart murmur    Hiatal hernia  Hypertension    Irritable bowel syndrome    Pancreatitis, chronic    Peripheral vascular disease 02/20/2010   s/p stent of left SFA; carotid stenosis   Seizure 11/04/2018   last 1 yr 2020 per pt unsure month    Sleep apnea    no cpap    Stroke    Thyroid nodule    Vitamin B12 deficiency     Past Surgical History:  Procedure Laterality Date   ABDOMINAL HYSTERECTOMY     partial   AUGMENTATION MAMMAPLASTY     bilateral 1976 retro    BREAST ENHANCEMENT SURGERY     CARDIAC CATHETERIZATION  07/07/98;08/31/01;03/04/05   60 -70% LAD   CATARACT EXTRACTION Bilateral    COLON RESECTION N/A 10/14/2017   Procedure: EXPLORATORY  LAPAROTOMY, EXTENDED RIGHT COLECTOMY; APPLICATION OF ABDOMINAL VACUUM DRESSING;  Surgeon: Griselda Miner, MD;  Location: [facility];  Service: General;  Laterality: N/A;   COLONOSCOPY     ENDOBRONCHIAL ULTRASOUND Bilateral 02/24/2017   Procedure: ENDOBRONCHIAL ULTRASOUND;  Surgeon: Leslye Peer, MD;  Location: [facility];  Service: Cardiopulmonary;  Laterality: Bilateral;   FEMORAL ARTERY STENT     Left leg   LAPAROTOMY N/A 10/16/2017   Procedure: EXPLORATORY LAPAROTOMY, PARTIAL OMENTECTOMY, RESECTION ISCHEMIC ILEUM 130CM, APPLICATION OF VAC ABDOMINAL DRESSING;  Surgeon: Darnell Level, MD;  Location: [facility];  Service: General;  Laterality: N/A;   LAPAROTOMY N/A 10/18/2017   Procedure: RE-EXPLORATION OF ABDOMEN, ILEOSTOMY CREATION;  Surgeon: Almond Lint, MD;  Location: [facility];  Service: General;  Laterality: N/A;   LUNG SURGERY  03/2017   Benign polyps removed   PATELLA REALIGNMENT Left    POLYPECTOMY     SIGMOIDOSCOPY     TONSILLECTOMY     TRANSCAROTID ARTERY REVASCULARIZATION  Right 04/18/2022   Procedure: Transcarotid Artery Revascularization;  Surgeon: Victorino Sparrow, MD;  Location: [facility];  Service: Vascular;  Laterality: Right;    Current Medications: Outpatient Medications Prior to Visit  Medication Sig Dispense Refill   acetaminophen (TYLENOL) 325 MG tablet Take 650 mg by mouth every 6 (six) hours as needed for mild pain.     albuterol (VENTOLIN HFA) 108 (90 Base) MCG/ACT inhaler Inhale 1-2 puffs into the lungs every 6 (six) hours as needed for wheezing or shortness of breath.     amLODipine (NORVASC) 10 MG tablet Take 10 mg by mouth daily.     aspirin 81 MG chewable tablet Chew 1 tablet (81 mg total) by mouth daily.     atorvastatin (LIPITOR) 40 MG tablet Take 1 tablet (40 mg total) by mouth daily. 30 tablet 0   cholestyramine (QUESTRAN) 4 g packet Take 0.5 packets by mouth in the morning and at bedtime.     clopidogrel (PLAVIX) 75 MG tablet Take 1 tablet (75 mg total) by mouth  daily. 30 tablet 0   [employer] DIGESTIVE PROBIOTIC 250 MG capsule Take 500 mg by mouth daily.     fluticasone (FLONASE) 50 MCG/ACT nasal spray Place 2 sprays into both nostrils daily as needed for allergies.     furosemide (LASIX) 20 MG tablet Take 20 mg by mouth daily.     hydrALAZINE (APRESOLINE) 100 MG tablet Take 1 tablet (100 mg total) by mouth every 8 (eight) hours. 90 tablet 0   levETIRAcetam (KEPPRA) 500 MG tablet TAKE 1 TABLET BY MOUTH TWICE A DAY (Patient taking differently: Take 500 mg by mouth 2 (two) times daily.) 180 tablet 2   losartan (COZAAR) 50 MG tablet TAKE  ONE TABLET BY MOUTH EVERY DAY 30 tablet 11   magnesium oxide (MAG-OX) 400 MG tablet Take 1 tablet by mouth daily.     meclizine (ANTIVERT) 25 MG tablet Take 0.5 tablets (12.5 mg total) by mouth 3 (three) times daily as needed for dizziness. 30 tablet 0   metoprolol succinate (TOPROL-XL) 100 MG 24 hr tablet Take 100 mg by mouth daily.     mirtazapine (REMERON) 15 MG tablet Take 15 mg by mouth at bedtime.     montelukast (SINGULAIR) 10 MG tablet Take 10 mg by mouth daily.     ondansetron (ZOFRAN) 4 MG tablet Take 1 tablet (4 mg total) by mouth every 6 (six) hours as needed for nausea or vomiting. 40 tablet 1   Pancrelipase, Lip-Prot-Amyl, (ZENPEP) 40000-126000 units CPEP Take 2 capsules with meals and one capsule with snacks. (Patient taking differently: Take 1 capsule by mouth daily with breakfast.) 240 capsule 0   pantoprazole (PROTONIX) 40 MG tablet Take 1 tablet (40 mg total) by mouth 2 (two) times daily. 180 tablet 2   Polysaccharide-Iron Complex 150 MG CAPS Take 1 tablet by mouth daily.     venlafaxine XR (EFFEXOR-XR) 75 MG 24 hr capsule Take 1 capsule (75 mg total) by mouth daily.     vitamin B-12 (CYANOCOBALAMIN) 500 MCG tablet Take 500 mcg by mouth daily.     Vitamin D, Ergocalciferol, (DRISDOL) 1.25 MG (50000 UT) CAPS capsule Take 50,000 Units by mouth every Wednesday.      nitroGLYCERIN (NITROSTAT) 0.4 MG SL tablet Place  1 tablet (0.4 mg total) under the tongue every 5 (five) minutes as needed for chest pain. 90 tablet 3   No facility-administered medications prior to visit.     Allergies:   Adhesive [tape], Other, Ace inhibitors, Sulfa antibiotics, Topiramate, Codeine, Iodinated contrast media, and Latex   Social History   Socioeconomic History   Marital status: Divorced    Spouse name: Not on file   Number of children: 2   Years of education: Not on file   Highest education level: Not on file  Occupational History   Occupation: retired    Associate Professor: DISABLED  Tobacco Use   Smoking status: Former    Packs/day: 1.00    Years: 50.00    Additional pack years: 0.00    Total pack years: 50.00    Types: Cigarettes    Quit date: 10/14/2010    Years since quitting: 12.2   Smokeless tobacco: Never  Vaping Use   Vaping Use: Never used  Substance and Sexual Activity   Alcohol use: No   Drug use: No   Sexual activity: Not Currently    Comment: Hysterectomy  Other Topics Concern   Not on file  Social History Narrative   Not on file   Social Determinants of Health   Financial Resource Strain: Not on file  Food Insecurity: No Food Insecurity (01/02/2023)   Hunger Vital Sign    Worried About Running Out of Food in the Last Year: Never true    Ran Out of Food in the Last Year: Never true  Transportation Needs: Not on file  Physical Activity: Not on file  Stress: Not on file  Social Connections: Not on file     Family History:  The patient's family history includes Colon cancer in her paternal grandfather; Hypertension in her father and mother; Osteoarthritis in her mother; Pulmonary embolism in her mother; Stroke in her father and another family member.   ROS:  Please see the history of present illness.    ROS All other systems reviewed and are negative.   PHYSICAL EXAM:   VS:  BP (!) 146/82   Pulse 70   Ht  (1.6 m)   Wt 171 lb 3.2 oz (77.7 kg)   SpO2 96%   BMI 30.33 kg/m    GEN:  Well nourished, obese, in no acute distress  HEENT: normal  Neck: no JVD, right carotid bruit, or masses Cardiac: RRR; no murmurs, rubs, or gallops,no edema  Respiratory:  clear to auscultation bilaterally, normal work of breathing GI: soft, nontender, nondistended, + BS MS: no deformity or atrophy  Skin: warm and dry, no rash, no edema. Pulses 2+ equal.  Neuro:  Alert and Oriented x 3, Strength and sensation are intact Psych: euthymic mood, full affect  Wt Readings from Last 3 Encounters:  01/23/23 171 lb 3.2 oz (77.7 kg)  01/02/23 188 lb 11.4 oz (85.6 kg)  08/15/22 169 lb 4 oz (76.8 kg)      Studies/Labs Reviewed:   EKG:  EKG is not ordered today.  Done 01/02/23. NSR with LVH. Lateral T wave inversion. I have personally reviewed and interpreted this study.   Recent Labs: 01/02/2023: ALT 15 01/04/2023: B Natriuretic Peptide 109.5; BUN 19; Creatinine, Ser 1.61; Hemoglobin 12.3; Magnesium 1.3; Platelets 212; Potassium 4.2; Sodium 133   Lipid Panel    Component Value Date/Time   CHOL 98 01/03/2023 0625   TRIG 119 01/03/2023 0625   HDL 49 01/03/2023 0625   CHOLHDL 2.0 01/03/2023 0625   VLDL 24 01/03/2023 0625   LDLCALC 25 01/03/2023 0625   Dated 05/08/20: creatinine 1.9. otherwise CMET normal. A1c 5.6%.  Dated 09/05/21: cholesterol 107, triglycerides 163, HDL 45, LDL 29. A1c 5.4%. Dated 01/14/23: normal LFTs   Ecg today shows NSR rate 67. Nonspecific TWA. I have personally reviewed and interpreted this study.   Additional studies/ records that were reviewed today include:   Echo 10/25/2017   LV EF: 55% -   60%   ------------------------------------------------------------------- Indications:      CVA 436.   ------------------------------------------------------------------- History:   PMH:   Coronary artery disease.  Risk factors: Hypertension. Diabetes mellitus. Dyslipidemia.   ------------------------------------------------------------------- Study Conclusions    - Left ventricle: The cavity size was normal. Wall thickness was   normal. Systolic function was normal. The estimated ejection   fraction was in the range of 55% to 60%. Although no diagnostic   regional wall motion abnormality was identified, this possibility   cannot be completely excluded on the basis of this study. Doppler   parameters are consistent with abnormal left ventricular   relaxation (grade 1 diastolic dysfunction). - Aortic valve: Poorly visualized. There was no stenosis. - Mitral valve: Mildly calcified annulus. There was trivial   regurgitation. - Right ventricle: The cavity size was normal. Systolic function   was normal. - Pulmonary arteries: No complete TR doppler jet so unable to   estimate PA systolic pressure. - Inferior vena cava: The vessel was normal in size. The   respirophasic diameter changes were in the normal range (>= 50%),   consistent with normal central venous pressure.   Impressions:   - Normal LV size with EF 55-60%. Normal RV size and systolic   function. No significant valvular abnormalities.  Myoview 12/18/18: Study Highlights    The left ventricular ejection fraction is mildly decreased (45-54%). Nuclear stress EF: 48%. No T wave inversion was noted during stress. There was no  ST segment deviation noted during stress. This is an intermediate risk study.   No reversible ischemia. LVEF 48% with basal septal dyskinesis. This is an intermediate risk study (LVEF<49%) - recommend correlating findings with echo.   Echo 04/30/19:IMPRESSIONS     1. The left ventricle has mildly reduced systolic function, with an  ejection fraction of 45-50%. The cavity size was normal. There is mildly  increased left ventricular wall thickness. Left ventricular diastolic  Doppler parameters are consistent with  impaired relaxation. Indeterminate filling pressures The E/e' is 8-15.   2. Moderate hypokinesis of the left ventricular, basal-mid inferolateral   wall.   3. The right ventricle has normal systolic function. The cavity was  normal. There is no increase in right ventricular wall thickness.   4. The mitral valve is abnormal. Mild thickening of the mitral valve  leaflet. Mild calcification of the mitral valve leaflet. There is mild  mitral annular calcification present.   5. The tricuspid valve is grossly normal.   6. The aortic valve is grossly normal. No stenosis of the aortic valve.   7. The aortic root and ascending aorta are normal in size and structure.   8. The inferior vena cava was normal in size with <50% respiratory  variability.   9. The average left ventricular global longitudinal strain is -18.6 %.  10. When compared to the prior study: 10/25/17 EF 55-60%.     Echocardiogram 11/17/2020 IMPRESSIONS     1. Left ventricular ejection fraction, by estimation, is 45 to 50%. The  left ventricle has mildly decreased function. The left ventricle  demonstrates regional wall motion abnormalities (see scoring  diagram/findings for description). There is moderate  left ventricular hypertrophy.   2. The mitral valve was not well visualized. No evidence of mitral valve  regurgitation.   3. The aortic valve is tricuspid. Mild aortic valve sclerosis is present,  with no evidence of aortic valve stenosis.   Comparison(s): Prior images reviewed side by side. Changes from prior  study are noted.   Echo 12/26/21: 1. Left ventricular ejection fraction, by estimation, is 45 to 50%. The  left ventricle has mildly decreased function. The left ventricle  demonstrates regional wall motion abnormalities. Apical hypokinesis. There  is mild left ventricular hypertrophy.  Left ventricular diastolic parameters are indeterminate.   2. Right ventricular systolic function is normal. The right ventricular  size is normal. Tricuspid regurgitation signal is inadequate for assessing  PA pressure.   3. The mitral valve is normal in structure. No  evidence of mitral valve  regurgitation.   4. The aortic valve was not well visualized. Aortic valve regurgitation  is not visualized. No aortic stenosis is present.   5. The inferior vena cava is normal in size with greater than 50%  respiratory variability, suggesting right atrial pressure of 3 mmHg.    ASSESSMENT:    1. Coronary artery disease involving native coronary artery of native heart with unstable angina pectoris   2. Right-sided extracranial carotid artery stenosis   3. Essential hypertension   4. Mixed hyperlipidemia   5. PVD (peripheral vascular disease)   6. Unstable angina      PLAN:  In order of problems listed above:  1. CAD: She has progressive/unstable angina over the past month. She is on good medical therapy. On DAPT. Has remote cath showing 60 to 70% stenosis in the LAD on multiple previous cardiac catheterizations.  Myoview in March 2020 showed normal perfusion. Given progressive symptoms I have  recommended proceeding directly to cardiac cath. Given CKD she will come in 3 hours early for hydration. Limit contrast load. Also needs treatment for contrast allergy. The procedure and risks were reviewed including but not limited to death, myocardial infarction, stroke, arrythmias, bleeding, transfusion, emergency surgery, dye allergy, or renal dysfunction. The patient voices understanding and is agreeable to proceed.  2. Hypertension:  on metoprolol, amlodipine, losartan, hydralazine  3. Bilateral carotid artery disease: severe RICA stenosis now s/p TCAR. totally occluded left carotid artery.  Followed closely by Dr Karin Lieuobins at [facility].   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   5. History of ischemic bowel with obstruction. s/p resection and ileostomy with subsequent reanastomosis.   6. OSA- intolerant of CPAP  7. PAD. LE arterial dopplers in Dec looked good.   8. Dyslipidemia. On statin. Excellent control.   Follow up in one year.  Medication Adjustments/Labs  and Tests Ordered: Current medicines are reviewed at length with the patient today.  Concerns regarding medicines are outlined above.  Medication changes, Labs and Tests ordered today are listed in the Patient Instructions below. Patient Instructions  Medication Instructions:  NO CHANGES  *If you need a refill on your cardiac medications before your next appointment, please call your pharmacy*   Lab Work: CBC and BMET today   If you have labs (blood work) drawn today and your tests are completely normal, you will receive your results only by: MyChart Message (if you have MyChart) OR A paper copy in the mail If you have any lab test that is abnormal or we need to change your treatment, we will call you to review the results.   Testing/Procedures: [facility] Dr. Sw[place]ilandJordan   Follow-Up: At [facility], you and your health needs are our priority.  As part of our continuing mission to provide you with exceptional heart care, we have created designated Provider Care Teams.  These Care Teams include your primary Cardiologist (physician) and Advanced Practice Providers (APPs -  Physician Assistants and Nurse Practitioners) who all work together to provide you with the care you need, when you need it.  We recommend signing up for the patient portal called "MyChart".  Sign up information is provided on this After Visit Summary.  MyChart is used to connect with patients for Virtual Visits (Telemedicine).  Patients are able to view lab/test results, encounter notes, upcoming appointments, etc.  Non-urgent messages can be sent to your provider as well.   To learn more about what you can do with MyChart, go to [email or web address].    Your next appointment:    3-4 weeks with Dr. SwazilandJordan   Other Instructions  [facility] [place] [place] [place] [facility] AT [facility] [id number] [facility] [place] [id number] Dept: [phone] Loc: [phone]  Cecelia ByarsLinda H Buckingham  01/23/2023  You are scheduled for a Cardiac Catheterization on Wednesday, April 17 with Dr. [name] Buresh [place].  1. [facility] at [facility]: [address] [place], [place] [address] at 6:30AM (This time is two hours before your procedure to ensure your preparation). Free valet parking service is available. You will check in at ADMITTING. The support person will be asked to wait in the waiting room.  It is OK to have someone drop you off and come back when you are ready to be discharged.    Special note: Every effort  is made to have your procedure done on time. Please understand that emergencies sometimes delay scheduled procedures.  2. Diet: Do not eat solid foods after midnight.  The patient may have clear liquids until 5am upon the day of the procedure.  3. Labs: CBC and BMET today 01/23/23  4. Medication instructions in preparation for your procedure:   Contrast Allergy:  Please take Prednisone  by mouth at: Thirteen hours prior to cath 9:00pm on Tuesday Seven hours prior to cath 3:00am on Wednesday And bring the last dose of Prednisone  and Benadryl  by mouth to take at the hospital    On the morning of your procedure, take your Aspirin 81 mg and Plavix/Clopidogrel and any morning medicines NOT listed above.  You may use sips of water.  5. Plan to go home the same day, you will only stay overnight if medically necessary. 6. Bring a current list of your medications and current insurance cards. 7. You MUST have a responsible person to drive you home. 8. Someone MUST be with you the first 24 hours after you arrive home or your discharge will be delayed. 9. Please wear clothes that are easy to get on and off and wear slip-on shoes.  Thank you for allowing [place] to care for you!   -- [facility] services      Signed, Ed Rayson [place], MD  01/23/2023 2:32 PM    [facility] [address] [place], [place], [place]  [address] Phone: [phone]; Fax: [phone]

## 2023-01-23 ENCOUNTER — Ambulatory Visit: Payer: Medicare HMO | Attending: Cardiology | Admitting: Cardiology

## 2023-01-23 ENCOUNTER — Other Ambulatory Visit: Payer: Self-pay | Admitting: Cardiology

## 2023-01-23 ENCOUNTER — Encounter: Payer: Self-pay | Admitting: Cardiology

## 2023-01-23 VITALS — BP 146/82 | HR 70 | Ht 63.0 in | Wt 171.2 lb

## 2023-01-23 DIAGNOSIS — I200 Unstable angina: Secondary | ICD-10-CM | POA: Diagnosis not present

## 2023-01-23 DIAGNOSIS — E782 Mixed hyperlipidemia: Secondary | ICD-10-CM | POA: Diagnosis not present

## 2023-01-23 DIAGNOSIS — I739 Peripheral vascular disease, unspecified: Secondary | ICD-10-CM

## 2023-01-23 DIAGNOSIS — I25110 Atherosclerotic heart disease of native coronary artery with unstable angina pectoris: Secondary | ICD-10-CM | POA: Diagnosis not present

## 2023-01-23 DIAGNOSIS — I10 Essential (primary) hypertension: Secondary | ICD-10-CM | POA: Diagnosis not present

## 2023-01-23 DIAGNOSIS — I6521 Occlusion and stenosis of right carotid artery: Secondary | ICD-10-CM | POA: Diagnosis not present

## 2023-01-23 DIAGNOSIS — M1712 Unilateral primary osteoarthritis, left knee: Secondary | ICD-10-CM | POA: Diagnosis not present

## 2023-01-23 MED ORDER — SODIUM CHLORIDE 0.9% FLUSH: 3.0000 mL | Freq: Two times a day (BID) | INTRAVENOUS | Status: DC

## 2023-01-23 MED ORDER — PREDNISONE 50 MG PO TABS: ORAL_TABLET | ORAL | 0 refills | Status: DC

## 2023-01-23 MED ORDER — NITROGLYCERIN 0.4 MG SL SUBL: 0.4000 mg | SUBLINGUAL_TABLET | SUBLINGUAL | 3 refills | Status: AC | PRN

## 2023-01-23 NOTE — Patient Instructions (Addendum)
Medication Instructions:  NO CHANGES  *If you need a refill on your cardiac medications before your next appointment, please call your pharmacy*   Lab Work: CBC and BMET today   If you have labs (blood work) drawn today and your tests are completely normal, you will receive your results only by: MyChart Message (if you have MyChart) OR A paper copy in the mail If you have any lab test that is abnormal or we need to change your treatment, we will call you to review the results.   Testing/Procedures: Cardiac Catheterization at [facility] with Dr. Swaziland   Follow-Up: At [facility], you and your health needs are our priority.  As part of our continuing mission to provide you with exceptional heart care, we have created designated Provider Care Teams.  These Care Teams include your primary Cardiologist (physician) and Advanced Practice Providers (APPs -  Physician Assistants and Nurse Practitioners) who all work together to provide you with the care you need, when you need it.  We recommend signing up for the patient portal called "MyChart".  Sign up information is provided on this After Visit Summary.  MyChart is used to connect with patients for Virtual Visits (Telemedicine).  Patients are able to view lab/test results, encounter notes, upcoming appointments, etc.  Non-urgent messages can be sent to your provider as well.   To learn more about what you can do with MyChart, go to [email or web address].    Your next appointment:    3-4 weeks with Dr. Swaziland   Other Instructions  [facility] DEPT OF [place]. [facility] AT [facility] [id number] [place] [address] Dept: [phone] Loc: [phone]  GWILI FELMLEE  01/23/2023  You are scheduled for a Cardiac Catheterization on Wednesday, April 17 with Dr. Peter Swaziland.  1. Please arrive at the [facility] (Main Entrance A) at  [facility]: [address] [place], [place] [address] at 6:30AM (This time is two hours before your procedure to ensure your preparation). Free valet parking service is available. You will check in at ADMITTING. The support person will be asked to wait in the waiting room.  It is OK to have someone drop you off and come back when you are ready to be discharged.    Special note: Every effort is made to have your procedure done on time. Please understand that emergencies sometimes delay scheduled procedures.  2. Diet: Do not eat solid foods after midnight.  The patient may have clear liquids until 5am upon the day of the procedure.  3. Labs: CBC and BMET today 01/23/23  4. Medication instructions in preparation for your procedure:   Contrast Allergy:  Please take Prednisone 50mg  by mouth at: Thirteen hours prior to cath 9:00pm on Tuesday Seven hours prior to cath 3:00am on Wednesday And bring the last dose of Prednisone 50mg  and Benadryl 50mg  by mouth to take at the hospital    On the morning of your procedure, take your Aspirin 81 mg and Plavix/Clopidogrel and any morning medicines NOT listed above.  You may use sips of water.  5. Plan to go home the same day, you will only stay overnight if medically necessary. 6. Bring a current list of your medications and current insurance cards. 7. You MUST have a responsible person to drive you home. 8. Someone MUST be with you the first 24 hours after you arrive home or your discharge will  be delayed. 9. Please wear clothes that are easy to get on and off and wear slip-on shoes.  Thank you for allowing [place] to care for you!   -- [place] [facility]

## 2023-01-24 LAB — BASIC METABOLIC PANEL
BUN/Creatinine Ratio: 12 (ref 12–28)
BUN: 24 mg/dL (ref 8–27)
CO2: 21 mmol/L (ref 20–29)
Calcium: 9 mg/dL (ref 8.7–10.3)
Chloride: 105 mmol/L (ref 96–106)
Creatinine, Ser: 1.93 mg/dL — ABNORMAL HIGH (ref 0.57–1.00)
Glucose: 99 mg/dL (ref 70–99)
Potassium: 5.1 mmol/L (ref 3.5–5.2)
Sodium: 141 mmol/L (ref 134–144)
eGFR: 27 mL/min/{1.73_m2} — ABNORMAL LOW (ref >59)

## 2023-01-24 LAB — CBC
Hematocrit: 38.6 % (ref 34.0–46.6)
Hemoglobin: 12.4 g/dL (ref 11.1–15.9)
MCH: 30 pg (ref 26.6–33.0)
MCHC: 32.1 g/dL (ref 31.5–35.7)
MCV: 94 fL (ref 79–97)
Platelets: 275 10*3/uL (ref 150–450)
RBC: 4.13 x10E6/uL (ref 3.77–5.28)
RDW: 12.6 % (ref 11.7–15.4)
WBC: 14.2 10*3/uL — ABNORMAL HIGH (ref 3.4–10.8)

## 2023-01-28 ENCOUNTER — Telehealth: Payer: Self-pay | Admitting: *Deleted

## 2023-01-28 NOTE — Telephone Encounter (Addendum)
Cardiac Catheterization scheduled at [facility] for: Wednesday January 29, 2023 10 AM Arrival time [facility] Main Entrance A at: 6 AM-pre-procedure hydration  Nothing to eat after midnight prior to procedure, clear liquids until 5 AM day of procedure.  CONTRAST ALLERGY: 13 hour Prednisone and Benadryl Prep reviewed with patient: 01/28/23 Prednisone 50 mg 9 PM 01/29/23 Prednisone 50 mg 3 AM 01/29/23 Prednisone 50 mg and Benadryl 50 mg -pt will take with her to hospital and take at 8 AM. Pt advised to let Short Stay nurse know she has medication with her.  Medication instructions: -Hold:  Lasix/Losartan-day before and day of procedure-per Dr Rodman Comp protocol GFR 27 -Other usual morning medications can be taken with sips of water including aspirin 81 mg and Plavix 75 mg.  Confirmed patient has responsible adult to drive home post procedure and be with patient first 24 hours after arriving home.  Plan to go home the same day, you will only stay overnight if medically necessary.  Reviewed procedure instructions with patient and patient's daughter-in-law, Toniann Fail [phone].

## 2023-01-29 ENCOUNTER — Ambulatory Visit (HOSPITAL_COMMUNITY)
Admission: RE | Admit: 2023-01-29 | Discharge: 2023-01-29 | Disposition: A | Discharge status: Home / Self Care | Payer: Medicare HMO | Attending: Cardiology | Admitting: Cardiology

## 2023-01-29 ENCOUNTER — Encounter (HOSPITAL_COMMUNITY)
Admission: RE | Disposition: A | Discharge status: Home / Self Care | Payer: Self-pay | Source: Home / Self Care | Attending: Cardiology

## 2023-01-29 DIAGNOSIS — I6521 Occlusion and stenosis of right carotid artery: Secondary | ICD-10-CM | POA: Diagnosis not present

## 2023-01-29 DIAGNOSIS — E1151 Type 2 diabetes mellitus with diabetic peripheral angiopathy without gangrene: Secondary | ICD-10-CM | POA: Diagnosis not present

## 2023-01-29 DIAGNOSIS — I129 Hypertensive chronic kidney disease with stage 1 through stage 4 chronic kidney disease, or unspecified chronic kidney disease: Secondary | ICD-10-CM | POA: Diagnosis not present

## 2023-01-29 DIAGNOSIS — E782 Mixed hyperlipidemia: Secondary | ICD-10-CM | POA: Diagnosis not present

## 2023-01-29 DIAGNOSIS — G4733 Obstructive sleep apnea (adult) (pediatric): Secondary | ICD-10-CM | POA: Diagnosis not present

## 2023-01-29 DIAGNOSIS — Z87891 Personal history of nicotine dependence: Secondary | ICD-10-CM | POA: Insufficient documentation

## 2023-01-29 DIAGNOSIS — I25110 Atherosclerotic heart disease of native coronary artery with unstable angina pectoris: Secondary | ICD-10-CM | POA: Insufficient documentation

## 2023-01-29 DIAGNOSIS — E1122 Type 2 diabetes mellitus with diabetic chronic kidney disease: Secondary | ICD-10-CM | POA: Diagnosis not present

## 2023-01-29 DIAGNOSIS — I2584 Coronary atherosclerosis due to calcified coronary lesion: Secondary | ICD-10-CM | POA: Insufficient documentation

## 2023-01-29 DIAGNOSIS — N184 Chronic kidney disease, stage 4 (severe): Secondary | ICD-10-CM | POA: Insufficient documentation

## 2023-01-29 DIAGNOSIS — Z01812 Encounter for preprocedural laboratory examination: Secondary | ICD-10-CM

## 2023-01-29 DIAGNOSIS — Z8673 Personal history of transient ischemic attack (TIA), and cerebral infarction without residual deficits: Secondary | ICD-10-CM | POA: Diagnosis not present

## 2023-01-29 DIAGNOSIS — Z79899 Other long term (current) drug therapy: Secondary | ICD-10-CM | POA: Insufficient documentation

## 2023-01-29 DIAGNOSIS — I200 Unstable angina: Secondary | ICD-10-CM

## 2023-01-29 DIAGNOSIS — K219 Gastro-esophageal reflux disease without esophagitis: Secondary | ICD-10-CM | POA: Diagnosis not present

## 2023-01-29 HISTORY — PX: LEFT HEART CATH AND CORONARY ANGIOGRAPHY: CATH118249

## 2023-01-29 LAB — BASIC METABOLIC PANEL
Anion gap: 9 (ref 5–15)
BUN: 21 mg/dL (ref 8–23)
CO2: 20 mmol/L — ABNORMAL LOW (ref 22–32)
Calcium: 8.7 mg/dL — ABNORMAL LOW (ref 8.9–10.3)
Chloride: 103 mmol/L (ref 98–111)
Creatinine, Ser: 1.88 mg/dL — ABNORMAL HIGH (ref 0.44–1.00)
GFR, Estimated: 28 mL/min — ABNORMAL LOW (ref >60)
Glucose, Bld: 189 mg/dL — ABNORMAL HIGH (ref 70–99)
Potassium: 4.7 mmol/L (ref 3.5–5.1)
Sodium: 132 mmol/L — ABNORMAL LOW (ref 135–145)

## 2023-01-29 LAB — GLUCOSE, CAPILLARY
Glucose-Capillary: 174 mg/dL — ABNORMAL HIGH (ref 70–99)
Glucose-Capillary: 158 mg/dL — ABNORMAL HIGH (ref 70–99)

## 2023-01-29 SURGERY — LEFT HEART CATH AND CORONARY ANGIOGRAPHY
Anesthesia: LOCAL

## 2023-01-29 MED ORDER — HEPARIN (PORCINE) IN NACL 1000-0.9 UT/500ML-% IV SOLN
INTRAVENOUS | Status: DC | PRN
  Administered 2023-01-29: 1000 mL

## 2023-01-29 MED ORDER — SODIUM CHLORIDE 0.9% FLUSH: 3.0000 mL | INTRAVENOUS | Status: DC | PRN

## 2023-01-29 MED ORDER — FENTANYL CITRATE (PF) 100 MCG/2ML IJ SOLN
INTRAMUSCULAR | Status: AC
  Filled 2023-01-29: qty 2

## 2023-01-29 MED ORDER — NITROGLYCERIN 1 MG/10 ML FOR IR/CATH LAB
INTRA_ARTERIAL | Status: AC
  Filled 2023-01-29: qty 10

## 2023-01-29 MED ORDER — MIDAZOLAM HCL 2 MG/2ML IJ SOLN
INTRAMUSCULAR | Status: AC
  Filled 2023-01-29: qty 2

## 2023-01-29 MED ORDER — LIDOCAINE HCL (PF) 1 % IJ SOLN
INTRAMUSCULAR | Status: DC | PRN
  Administered 2023-01-29: 2 mL via INTRADERMAL

## 2023-01-29 MED ORDER — HYDRALAZINE HCL 20 MG/ML IJ SOLN
INTRAMUSCULAR | Status: DC | PRN
  Administered 2023-01-29 (×2): 10 mg via INTRAVENOUS

## 2023-01-29 MED ORDER — HYDRALAZINE HCL 20 MG/ML IJ SOLN
10.0000 mg | Freq: Once | INTRAMUSCULAR | Status: AC
  Administered 2023-01-29: 10 mg via INTRAVENOUS
  Filled 2023-01-29: qty 1

## 2023-01-29 MED ORDER — MIDAZOLAM HCL 2 MG/2ML IJ SOLN
INTRAMUSCULAR | Status: DC | PRN
  Administered 2023-01-29: 1 mg via INTRAVENOUS

## 2023-01-29 MED ORDER — ONDANSETRON HCL 4 MG/2ML IJ SOLN: 4.0000 mg | Freq: Four times a day (QID) | INTRAMUSCULAR | Status: DC | PRN

## 2023-01-29 MED ORDER — ACETAMINOPHEN 325 MG PO TABS: 650.0000 mg | ORAL_TABLET | ORAL | Status: DC | PRN

## 2023-01-29 MED ORDER — LIDOCAINE HCL (PF) 1 % IJ SOLN
INTRAMUSCULAR | Status: AC
  Filled 2023-01-29: qty 30

## 2023-01-29 MED ORDER — LABETALOL HCL 5 MG/ML IV SOLN
10.0000 mg | INTRAVENOUS | Status: DC | PRN
  Administered 2023-01-29: 10 mg via INTRAVENOUS
  Filled 2023-01-29: qty 4

## 2023-01-29 MED ORDER — SODIUM CHLORIDE 0.9 % WEIGHT BASED INFUSION: 1.0000 mL/kg/h | INTRAVENOUS | Status: DC

## 2023-01-29 MED ORDER — HYDRALAZINE HCL 20 MG/ML IJ SOLN
INTRAMUSCULAR | Status: AC
  Filled 2023-01-29: qty 1

## 2023-01-29 MED ORDER — ASPIRIN 81 MG PO CHEW: 81.0000 mg | CHEWABLE_TABLET | ORAL | Status: DC

## 2023-01-29 MED ORDER — PREDNISONE 50 MG PO TABS: ORAL_TABLET | ORAL | 0 refills | Status: DC

## 2023-01-29 MED ORDER — HEPARIN SODIUM (PORCINE) 1000 UNIT/ML IJ SOLN
INTRAMUSCULAR | Status: DC | PRN
  Administered 2023-01-29: 3500 [IU] via INTRAVENOUS

## 2023-01-29 MED ORDER — VERAPAMIL HCL 2.5 MG/ML IV SOLN
INTRAVENOUS | Status: AC
  Filled 2023-01-29: qty 2

## 2023-01-29 MED ORDER — ISOSORBIDE MONONITRATE ER 60 MG PO TB24: 60.0000 mg | ORAL_TABLET | Freq: Every day | ORAL | 4 refills | Status: DC

## 2023-01-29 MED ORDER — HYDRALAZINE HCL 20 MG/ML IJ SOLN: 10.0000 mg | INTRAMUSCULAR | Status: DC | PRN

## 2023-01-29 MED ORDER — SODIUM CHLORIDE 0.9 % IV SOLN: 250.0000 mL | INTRAVENOUS | Status: DC | PRN

## 2023-01-29 MED ORDER — SODIUM CHLORIDE 0.9 % WEIGHT BASED INFUSION
3.0000 mL/kg/h | INTRAVENOUS | Status: AC
  Administered 2023-01-29: 3 mL/kg/h via INTRAVENOUS

## 2023-01-29 MED ORDER — SODIUM CHLORIDE 0.9% FLUSH: 3.0000 mL | Freq: Two times a day (BID) | INTRAVENOUS | Status: DC

## 2023-01-29 MED ORDER — FENTANYL CITRATE (PF) 100 MCG/2ML IJ SOLN
INTRAMUSCULAR | Status: DC | PRN
  Administered 2023-01-29: 25 ug via INTRAVENOUS

## 2023-01-29 MED ORDER — HEPARIN SODIUM (PORCINE) 1000 UNIT/ML IJ SOLN
INTRAMUSCULAR | Status: AC
  Filled 2023-01-29: qty 10

## 2023-01-29 SURGICAL SUPPLY — 10 items
CATH 5FR JL3.5 JR4 ANG PIG MP (CATHETERS) IMPLANT
DEVICE RAD TR BAND REGULAR (VASCULAR PRODUCTS) IMPLANT
GLIDESHEATH SLEND SS 6F .021 (SHEATH) IMPLANT
GUIDEWIRE INQWIRE 1.5J.035X260 (WIRE) IMPLANT
INQWIRE 1.5J .035X260CM (WIRE) ×1
KIT HEART LEFT (KITS) ×1 IMPLANT
PACK CARDIAC CATHETERIZATION (CUSTOM PROCEDURE TRAY) ×1 IMPLANT
SHEATH PROBE COVER 6X72 (BAG) IMPLANT
TRANSDUCER W/STOPCOCK (MISCELLANEOUS) ×1 IMPLANT
TUBING CIL FLEX 10 FLL-RA (TUBING) ×1 IMPLANT

## 2023-01-29 NOTE — Discharge Instructions (Addendum)
Add Imdur 60 mg daily  Arrive at [facility] on Wednesday 02/12/23 @ 6am.  Nothing to eat or drink after midnight.  Take dye allergy medications as prescribed.  Take Prednisone  PO 4/30 @ 10:00pm, 5/1 @ 4am, 5/1 @ 10am Take Benadryl  5/1 @ 10am   Radial Site Care Drink plenty of fluid for the next 3 days  Keep arm elevated for the next 24 hours The following information offers guidance on how to care for yourself after your procedure. Your health care provider may also give you more specific instructions. If you have problems or questions, contact your health care provider. What can I expect after the procedure? After the procedure, it is common to have bruising and tenderness in the incision area. Follow these instructions at home: Incision site care  Follow instructions from your health care provider about how to take care of your incision site. Make sure you:  Remove your dressing 24 hours after discharge.  Do not take baths, swim, or use a hot tub for 1 week. You may shower 24 hours after the procedure or as told by your health care provider. Remove the dressing and gently wash the incision area with plain soap and water. Pat the area dry with a clean towel. Do not rub the site. That could cause bleeding. Do not apply powder or lotion to the site. Check your incision site every day for signs of infection. Check for: Redness, swelling, or pain. Fluid or blood. Warmth. Pus or a bad smell. Activity For 24 hours after the procedure, or as directed by your health care provider: Do not flex or bend the affected arm. Do not push or pull heavy objects with the affected arm. Do not operate machinery or power tools. Do not drive. You should not drive yourself home from the hospital or clinic if you go home during that time period. You may drive 24 hours after the procedure unless your health care provider tells you not to. Do not lift anything that is heavier than 10 lb (4.5 kg),  or the limit that you are told, until your health care provider says that it is safe. Return to your normal activities as told by your health care provider. Ask your health care provider what activities are safe for you and when you can return to work. If you were given a sedative during the procedure, it can affect you for several hours. Do not drive or operate machinery until your health care provider says that it is safe. General instructions Take over-the-counter and prescription medicines only as told by your health care provider. If you will be going home right after the procedure, plan to have a responsible adult care for you for the time you are told. This is important. Keep all follow-up visits. This is important. Contact a health care provider if: You have a fever or chills. You have any of these signs of infection at your incision site: Redness, swelling, or pain. Fluid or blood. Warmth. Pus or a bad smell. Get help right away if: The incision area swells very fast. The incision area is bleeding, and the bleeding does not stop when you hold steady pressure on the area. Your arm or hand becomes pale, cool, tingly, or numb. These symptoms may represent a serious problem that is an emergency. Do not wait to see if the symptoms will go away. Get medical help right away. Call your local emergency services (911 in the [place] Do not drive  yourself to the hospital. Summary After the procedure, it is common to have bruising and tenderness at the incision site. Follow instructions from your health care provider about how to take care of your radial site incision. Check the incision every day for signs of infection. Do not lift anything that is heavier than 10 lb (4.5 kg), or the limit that you are told, until your health care provider says that it is safe. Get help right away if the incision area swells very fast, you have bleeding at the incision site that will not stop, or your arm or hand  becomes pale, cool, or numb. This information is not intended to replace advice given to you by your health care provider. Make sure you discuss any questions you have with your health care provider. Document Revised: 11/19/2020 Document Reviewed: 11/19/2020 Elsevier Patient Education  2023 [employer].

## 2023-01-29 NOTE — Interval H&P Note (Signed)
History and Physical Interval Note:  01/29/2023 11:11 AM  [name]  has presented today for surgery, with the diagnosis of unstable angina.  The various methods of treatment have been discussed with the patient and family. After consideration of risks, benefits and other options for treatment, the patient has consented to  Procedure(s): LEFT HEART CATH AND CORONARY ANGIOGRAPHY (N/A) as a surgical intervention.  The patient's history has been reviewed, patient examined, no change in status, stable for surgery.  I have reviewed the patient's chart and labs.  Questions were answered to the patient's satisfaction.   Cath Lab Visit (complete for each Cath Lab visit)  Clinical Evaluation Leading to the Procedure:   ACS: Yes.    Non-ACS:    Anginal Classification: CCS III  Anti-ischemic medical therapy: Maximal Therapy (2 or more classes of medications)  Non-Invasive Test Results: No non-invasive testing performed  Prior CABG: No previous CABG        Theron Arista [facility] 01/29/2023 11:11 AM

## 2023-01-29 NOTE — Progress Notes (Signed)
TR BAND REMOVAL  LOCATION:    right radial  DEFLATED PER PROTOCOL:    Yes.    TIME BAND OFF / DRESSING APPLIED:    1430 gauze dressing applied   SITE UPON ARRIVAL:    Level 0  SITE AFTER BAND REMOVAL:    Level 0  CIRCULATION SENSATION AND MOVEMENT:    Within Normal Limits   Yes.    COMMENTS:   no new issues noted 

## 2023-01-29 NOTE — Progress Notes (Signed)
Benadryl 50 mg PO taken by patient at 0800.

## 2023-01-30 ENCOUNTER — Encounter (HOSPITAL_COMMUNITY): Payer: Self-pay | Admitting: Cardiology

## 2023-01-30 ENCOUNTER — Telehealth: Payer: Self-pay | Admitting: Cardiology

## 2023-01-30 DIAGNOSIS — M1712 Unilateral primary osteoarthritis, left knee: Secondary | ICD-10-CM | POA: Diagnosis not present

## 2023-01-30 DIAGNOSIS — M1711 Unilateral primary osteoarthritis, right knee: Secondary | ICD-10-CM | POA: Diagnosis not present

## 2023-01-30 MED FILL — Verapamil HCl IV Soln 2.5 MG/ML: INTRAVENOUS | Qty: 2 | Status: AC

## 2023-01-30 NOTE — Telephone Encounter (Signed)
Left VM message for the patient to call the clinic.

## 2023-01-30 NOTE — Telephone Encounter (Signed)
Assessed patients radial site- old bloody drainage present on tegaderm. Patient states she was nervous to remove due to it possibly still bleeding. Patients arm slightly bruised, patient reports normal sensation, and vascular assessment WNL. Dr. Swaziland assessed and removed dressing- no active bleeding noted. Area cleaned, and band aid applied to area and patient left clinic in stable condition. Advised patient to call back to office with any issues, questions, or concerns.

## 2023-01-31 DIAGNOSIS — E1122 Type 2 diabetes mellitus with diabetic chronic kidney disease: Secondary | ICD-10-CM | POA: Diagnosis not present

## 2023-01-31 DIAGNOSIS — R609 Edema, unspecified: Secondary | ICD-10-CM | POA: Diagnosis not present

## 2023-01-31 DIAGNOSIS — E785 Hyperlipidemia, unspecified: Secondary | ICD-10-CM | POA: Diagnosis not present

## 2023-01-31 DIAGNOSIS — I129 Hypertensive chronic kidney disease with stage 1 through stage 4 chronic kidney disease, or unspecified chronic kidney disease: Secondary | ICD-10-CM | POA: Diagnosis not present

## 2023-01-31 DIAGNOSIS — K219 Gastro-esophageal reflux disease without esophagitis: Secondary | ICD-10-CM | POA: Diagnosis not present

## 2023-01-31 DIAGNOSIS — N1832 Chronic kidney disease, stage 3b: Secondary | ICD-10-CM | POA: Diagnosis not present

## 2023-01-31 DIAGNOSIS — D509 Iron deficiency anemia, unspecified: Secondary | ICD-10-CM | POA: Diagnosis not present

## 2023-02-04 ENCOUNTER — Telehealth: Payer: Self-pay | Admitting: *Deleted

## 2023-02-04 DIAGNOSIS — I25110 Atherosclerotic heart disease of native coronary artery with unstable angina pectoris: Secondary | ICD-10-CM

## 2023-02-04 DIAGNOSIS — Z01812 Encounter for preprocedural laboratory examination: Secondary | ICD-10-CM

## 2023-02-04 NOTE — Telephone Encounter (Signed)
Patient will plan to get BMP/CBC Thursday 02/06/23 at Dr Elvis Coil Office.

## 2023-02-04 NOTE — Telephone Encounter (Signed)
Coronary Stent 02/12/23-Call placed to patient to make arrangements for pre-procedure BMP/CBC-left message for Toniann Fail, daughter-in-law, to call back.

## 2023-02-06 DIAGNOSIS — M1712 Unilateral primary osteoarthritis, left knee: Secondary | ICD-10-CM | POA: Diagnosis not present

## 2023-02-06 DIAGNOSIS — I25110 Atherosclerotic heart disease of native coronary artery with unstable angina pectoris: Secondary | ICD-10-CM | POA: Diagnosis not present

## 2023-02-06 DIAGNOSIS — Z01812 Encounter for preprocedural laboratory examination: Secondary | ICD-10-CM | POA: Diagnosis not present

## 2023-02-07 LAB — BASIC METABOLIC PANEL
BUN/Creatinine Ratio: 10 — ABNORMAL LOW (ref 12–28)
BUN: 23 mg/dL (ref 8–27)
CO2: 17 mmol/L — ABNORMAL LOW (ref 20–29)
Calcium: 8.6 mg/dL — ABNORMAL LOW (ref 8.7–10.3)
Chloride: 104 mmol/L (ref 96–106)
Creatinine, Ser: 2.31 mg/dL — ABNORMAL HIGH (ref 0.57–1.00)
Glucose: 87 mg/dL (ref 70–99)
Potassium: 4.3 mmol/L (ref 3.5–5.2)
Sodium: 139 mmol/L (ref 134–144)
eGFR: 22 mL/min/{1.73_m2} — ABNORMAL LOW (ref >59)

## 2023-02-07 LAB — CBC
Hematocrit: 37.9 % (ref 34.0–46.6)
Hemoglobin: 12.1 g/dL (ref 11.1–15.9)
MCH: 30 pg (ref 26.6–33.0)
MCHC: 31.9 g/dL (ref 31.5–35.7)
MCV: 94 fL (ref 79–97)
Platelets: 251 10*3/uL (ref 150–450)
RBC: 4.03 x10E6/uL (ref 3.77–5.28)
RDW: 12.8 % (ref 11.7–15.4)
WBC: 12.7 10*3/uL — ABNORMAL HIGH (ref 3.4–10.8)

## 2023-02-10 MED ORDER — PREDNISONE 50 MG PO TABS: ORAL_TABLET | ORAL | 0 refills | Status: DC

## 2023-02-10 NOTE — Telephone Encounter (Addendum)
Coronary Stent  scheduled at [facility] for: Wednesday Feb 12, 2023 11:30 AM Arrival time [facility] Main Entrance A at: 6:30 AM-pre-procedure hydration  Nothing to eat after midnight prior to procedure, clear liquids until 5 AM day of procedure.  CONTRAST ALLERGY: 13 hour Prednisone and Benadryl Prep: 02/11/23 Prednisone 50 mg 10:30 PM 02/12/23 Prednisone 50 mg 4:30 AM 02/12/23 Prednisone 50 mg and Benadryl 50 mg 9:30 AM at the hospital I have asked patient to take medication with her to hospital, let Short Stay nurse know she has medication with her to take 9:30 AM.  Medication instructions: -Hold:  Lasix/Losartan-now until post procedure-per Dr Swaziland-  See 02/06/23 BMP results and notes.  Patient's daughter-in-law, Toniann Fail, reports patient did not get 02/07/23 message to stop lasix and losartan until after PCI 02/12/23, last dose was this morning. -Other usual morning medications can be taken with sips of water including aspirin 81 mg and Plavix 75 mg.  Confirmed patient has responsible adult to drive home post procedure and be with patient first 24 hours after arriving home.  Plan to go home the same day, you will only stay overnight if medically necessary.  Reviewed procedure instructions with patient and patient's daughter-in-law Toniann Fail, [phone].

## 2023-02-12 ENCOUNTER — Encounter (HOSPITAL_COMMUNITY)
Admission: RE | Disposition: A | Discharge status: Home / Self Care | Payer: Self-pay | Source: Home / Self Care | Attending: Cardiology

## 2023-02-12 ENCOUNTER — Encounter (HOSPITAL_COMMUNITY): Payer: Self-pay | Admitting: Cardiology

## 2023-02-12 ENCOUNTER — Other Ambulatory Visit: Payer: Self-pay

## 2023-02-12 ENCOUNTER — Ambulatory Visit (HOSPITAL_COMMUNITY)
Admission: RE | Admit: 2023-02-12 | Discharge: 2023-02-13 | Disposition: A | Discharge status: Home / Self Care | Payer: Medicare HMO | Attending: Cardiology | Admitting: Cardiology

## 2023-02-12 DIAGNOSIS — I130 Hypertensive heart and chronic kidney disease with heart failure and stage 1 through stage 4 chronic kidney disease, or unspecified chronic kidney disease: Secondary | ICD-10-CM | POA: Insufficient documentation

## 2023-02-12 DIAGNOSIS — E1122 Type 2 diabetes mellitus with diabetic chronic kidney disease: Secondary | ICD-10-CM | POA: Diagnosis not present

## 2023-02-12 DIAGNOSIS — Z955 Presence of coronary angioplasty implant and graft: Secondary | ICD-10-CM

## 2023-02-12 DIAGNOSIS — N183 Chronic kidney disease, stage 3 unspecified: Secondary | ICD-10-CM | POA: Diagnosis not present

## 2023-02-12 DIAGNOSIS — I200 Unstable angina: Secondary | ICD-10-CM | POA: Diagnosis present

## 2023-02-12 DIAGNOSIS — I2510 Atherosclerotic heart disease of native coronary artery without angina pectoris: Secondary | ICD-10-CM | POA: Diagnosis not present

## 2023-02-12 DIAGNOSIS — E1151 Type 2 diabetes mellitus with diabetic peripheral angiopathy without gangrene: Secondary | ICD-10-CM | POA: Diagnosis not present

## 2023-02-12 DIAGNOSIS — I5022 Chronic systolic (congestive) heart failure: Secondary | ICD-10-CM | POA: Diagnosis not present

## 2023-02-12 DIAGNOSIS — I25110 Atherosclerotic heart disease of native coronary artery with unstable angina pectoris: Secondary | ICD-10-CM | POA: Diagnosis not present

## 2023-02-12 DIAGNOSIS — G4733 Obstructive sleep apnea (adult) (pediatric): Secondary | ICD-10-CM | POA: Insufficient documentation

## 2023-02-12 DIAGNOSIS — Z87891 Personal history of nicotine dependence: Secondary | ICD-10-CM | POA: Diagnosis not present

## 2023-02-12 DIAGNOSIS — Z8249 Family history of ischemic heart disease and other diseases of the circulatory system: Secondary | ICD-10-CM | POA: Diagnosis not present

## 2023-02-12 DIAGNOSIS — E782 Mixed hyperlipidemia: Secondary | ICD-10-CM | POA: Insufficient documentation

## 2023-02-12 DIAGNOSIS — I6521 Occlusion and stenosis of right carotid artery: Secondary | ICD-10-CM | POA: Diagnosis not present

## 2023-02-12 DIAGNOSIS — Z01812 Encounter for preprocedural laboratory examination: Secondary | ICD-10-CM

## 2023-02-12 HISTORY — PX: CORONARY STENT INTERVENTION: CATH118234

## 2023-02-12 HISTORY — PX: CORONARY ATHERECTOMY: CATH118238

## 2023-02-12 HISTORY — PX: CORONARY ULTRASOUND/IVUS: CATH118244

## 2023-02-12 LAB — POCT I-STAT, CHEM 8
BUN: 19 mg/dL (ref 8–23)
Calcium, Ion: 1.24 mmol/L (ref 1.15–1.40)
Chloride: 108 mmol/L (ref 98–111)
Creatinine, Ser: 1.8 mg/dL — ABNORMAL HIGH (ref 0.44–1.00)
Glucose, Bld: 165 mg/dL — ABNORMAL HIGH (ref 70–99)
HCT: 36 % (ref 36.0–46.0)
Hemoglobin: 12.2 g/dL (ref 12.0–15.0)
Potassium: 4 mmol/L (ref 3.5–5.1)
Sodium: 138 mmol/L (ref 135–145)
TCO2: 17 mmol/L — ABNORMAL LOW (ref 22–32)

## 2023-02-12 LAB — POCT ACTIVATED CLOTTING TIME
Activated Clotting Time: 158 seconds
Activated Clotting Time: 261 seconds
Activated Clotting Time: 261 seconds
Activated Clotting Time: 314 seconds

## 2023-02-12 SURGERY — CORONARY ATHERECTOMY
Anesthesia: LOCAL

## 2023-02-12 MED ORDER — HEPARIN SODIUM (PORCINE) 1000 UNIT/ML IJ SOLN
INTRAMUSCULAR | Status: DC | PRN
  Administered 2023-02-12: 7000 [IU] via INTRAVENOUS
  Administered 2023-02-12: 2000 [IU] via INTRAVENOUS
  Administered 2023-02-12: 4000 [IU] via INTRAVENOUS
  Administered 2023-02-12: 3000 [IU] via INTRAVENOUS

## 2023-02-12 MED ORDER — MIDAZOLAM HCL 2 MG/2ML IJ SOLN
INTRAMUSCULAR | Status: AC
  Filled 2023-02-12: qty 2

## 2023-02-12 MED ORDER — METOPROLOL SUCCINATE ER 100 MG PO TB24
100.0000 mg | ORAL_TABLET | Freq: Every day | ORAL | Status: DC
  Administered 2023-02-13: 100 mg via ORAL
  Filled 2023-02-12: qty 1

## 2023-02-12 MED ORDER — HYDRALAZINE HCL 50 MG PO TABS
100.0000 mg | ORAL_TABLET | Freq: Three times a day (TID) | ORAL | Status: DC
  Administered 2023-02-12 – 2023-02-13 (×2): 100 mg via ORAL
  Filled 2023-02-12 (×3): qty 2

## 2023-02-12 MED ORDER — ISOSORBIDE MONONITRATE ER 60 MG PO TB24
60.0000 mg | ORAL_TABLET | Freq: Every day | ORAL | Status: DC
  Administered 2023-02-13: 60 mg via ORAL
  Filled 2023-02-12: qty 1

## 2023-02-12 MED ORDER — ACETAMINOPHEN 325 MG PO TABS
650.0000 mg | ORAL_TABLET | ORAL | Status: DC | PRN
  Administered 2023-02-12: 650 mg via ORAL
  Filled 2023-02-12: qty 2

## 2023-02-12 MED ORDER — SODIUM CHLORIDE 0.9 % WEIGHT BASED INFUSION
3.0000 mL/kg/h | INTRAVENOUS | Status: DC
  Administered 2023-02-12: 3 mL/kg/h via INTRAVENOUS

## 2023-02-12 MED ORDER — HEPARIN SODIUM (PORCINE) 1000 UNIT/ML IJ SOLN
INTRAMUSCULAR | Status: AC
  Filled 2023-02-12: qty 10

## 2023-02-12 MED ORDER — NITROGLYCERIN 1 MG/10 ML FOR IR/CATH LAB
INTRA_ARTERIAL | Status: DC | PRN
  Administered 2023-02-12 (×2): 200 ug via INTRACORONARY

## 2023-02-12 MED ORDER — LEVETIRACETAM 500 MG PO TABS
500.0000 mg | ORAL_TABLET | Freq: Two times a day (BID) | ORAL | Status: DC
  Administered 2023-02-12 – 2023-02-13 (×2): 500 mg via ORAL
  Filled 2023-02-12 (×2): qty 1

## 2023-02-12 MED ORDER — SODIUM CHLORIDE 0.9 % IV SOLN: 250.0000 mL | INTRAVENOUS | Status: DC | PRN

## 2023-02-12 MED ORDER — SODIUM CHLORIDE 0.9 % WEIGHT BASED INFUSION
1.0000 mL/kg/h | INTRAVENOUS | Status: AC
  Administered 2023-02-12: 1 mL/kg/h via INTRAVENOUS

## 2023-02-12 MED ORDER — ATORVASTATIN CALCIUM 40 MG PO TABS
40.0000 mg | ORAL_TABLET | Freq: Every day | ORAL | Status: DC
  Administered 2023-02-13: 40 mg via ORAL
  Filled 2023-02-12: qty 1

## 2023-02-12 MED ORDER — SACCHAROMYCES BOULARDII 250 MG PO CAPS
500.0000 mg | ORAL_CAPSULE | Freq: Every day | ORAL | Status: DC
  Administered 2023-02-13: 500 mg via ORAL
  Filled 2023-02-12: qty 2

## 2023-02-12 MED ORDER — ONDANSETRON HCL 4 MG/2ML IJ SOLN
4.0000 mg | Freq: Four times a day (QID) | INTRAMUSCULAR | Status: DC | PRN
  Administered 2023-02-12: 4 mg via INTRAVENOUS
  Filled 2023-02-12: qty 2

## 2023-02-12 MED ORDER — FENTANYL CITRATE (PF) 100 MCG/2ML IJ SOLN
INTRAMUSCULAR | Status: DC | PRN
  Administered 2023-02-12 (×3): 25 ug via INTRAVENOUS

## 2023-02-12 MED ORDER — ALBUTEROL SULFATE (2.5 MG/3ML) 0.083% IN NEBU: 3.0000 mL | INHALATION_SOLUTION | Freq: Four times a day (QID) | RESPIRATORY_TRACT | Status: DC | PRN

## 2023-02-12 MED ORDER — ENOXAPARIN SODIUM 30 MG/0.3ML IJ SOSY
30.0000 mg | PREFILLED_SYRINGE | INTRAMUSCULAR | Status: DC
  Administered 2023-02-13: 30 mg via SUBCUTANEOUS
  Filled 2023-02-12: qty 0.3

## 2023-02-12 MED ORDER — PANCRELIPASE (LIP-PROT-AMYL) 36000-114000 UNITS PO CPEP
360000.0000 [IU] | ORAL_CAPSULE | Freq: Every day | ORAL | Status: DC
  Administered 2023-02-13: 360000 [IU] via ORAL
  Filled 2023-02-12 (×2): qty 10

## 2023-02-12 MED ORDER — VERAPAMIL HCL 2.5 MG/ML IV SOLN
INTRAVENOUS | Status: DC | PRN
  Administered 2023-02-12: 10 mL via INTRA_ARTERIAL

## 2023-02-12 MED ORDER — NITROGLYCERIN 1 MG/10 ML FOR IR/CATH LAB
INTRA_ARTERIAL | Status: AC
  Filled 2023-02-12: qty 10

## 2023-02-12 MED ORDER — FENTANYL CITRATE (PF) 100 MCG/2ML IJ SOLN
INTRAMUSCULAR | Status: AC
  Filled 2023-02-12: qty 2

## 2023-02-12 MED ORDER — HYDRALAZINE HCL 50 MG PO TABS
100.0000 mg | ORAL_TABLET | Freq: Once | ORAL | Status: AC
  Administered 2023-02-12: 100 mg via ORAL
  Filled 2023-02-12 (×2): qty 2

## 2023-02-12 MED ORDER — AMLODIPINE BESYLATE 10 MG PO TABS
10.0000 mg | ORAL_TABLET | Freq: Every day | ORAL | Status: DC
  Administered 2023-02-12 – 2023-02-13 (×2): 10 mg via ORAL
  Filled 2023-02-12 (×2): qty 1

## 2023-02-12 MED ORDER — MONTELUKAST SODIUM 10 MG PO TABS
10.0000 mg | ORAL_TABLET | Freq: Every day | ORAL | Status: DC
  Administered 2023-02-13: 10 mg via ORAL
  Filled 2023-02-12: qty 1

## 2023-02-12 MED ORDER — POLYSACCHARIDE-IRON COMPLEX 150 MG PO CAPS: 1.0000 | ORAL_CAPSULE | Freq: Every day | ORAL | Status: DC

## 2023-02-12 MED ORDER — ASPIRIN 81 MG PO CHEW: 81.0000 mg | CHEWABLE_TABLET | ORAL | Status: DC

## 2023-02-12 MED ORDER — HEPARIN (PORCINE) IN NACL 1000-0.9 UT/500ML-% IV SOLN
INTRAVENOUS | Status: DC | PRN
  Administered 2023-02-12 (×2): 500 mL

## 2023-02-12 MED ORDER — FLUTICASONE PROPIONATE 50 MCG/ACT NA SUSP: 2.0000 | Freq: Every day | NASAL | Status: DC | PRN

## 2023-02-12 MED ORDER — SODIUM CHLORIDE 0.9% FLUSH: 3.0000 mL | INTRAVENOUS | Status: DC | PRN

## 2023-02-12 MED ORDER — SODIUM CHLORIDE 0.9% FLUSH
3.0000 mL | Freq: Two times a day (BID) | INTRAVENOUS | Status: DC
  Administered 2023-02-12 – 2023-02-13 (×2): 3 mL via INTRAVENOUS

## 2023-02-12 MED ORDER — MIRTAZAPINE 15 MG PO TABS
15.0000 mg | ORAL_TABLET | Freq: Every day | ORAL | Status: DC
  Administered 2023-02-12: 15 mg via ORAL
  Filled 2023-02-12 (×2): qty 1

## 2023-02-12 MED ORDER — MIDAZOLAM HCL 2 MG/2ML IJ SOLN
INTRAMUSCULAR | Status: DC | PRN
  Administered 2023-02-12 (×2): 1 mg via INTRAVENOUS

## 2023-02-12 MED ORDER — VITAMIN B-12 100 MCG PO TABS
500.0000 ug | ORAL_TABLET | Freq: Every day | ORAL | Status: DC
  Administered 2023-02-13: 500 ug via ORAL
  Filled 2023-02-12: qty 5

## 2023-02-12 MED ORDER — LABETALOL HCL 5 MG/ML IV SOLN
INTRAVENOUS | Status: AC
  Filled 2023-02-12: qty 4

## 2023-02-12 MED ORDER — ASPIRIN 81 MG PO CHEW
81.0000 mg | CHEWABLE_TABLET | Freq: Every day | ORAL | Status: DC
  Administered 2023-02-13: 81 mg via ORAL
  Filled 2023-02-12: qty 1

## 2023-02-12 MED ORDER — SODIUM CHLORIDE 0.9 % WEIGHT BASED INFUSION
1.0000 mL/kg/h | INTRAVENOUS | Status: DC
  Administered 2023-02-12: 1 mL/kg/h via INTRAVENOUS

## 2023-02-12 MED ORDER — SODIUM CHLORIDE 0.9 % IV SOLN
5.0000 mg/kg | Freq: Once | INTRAVENOUS | Status: AC
  Administered 2023-02-12: 340 mg via INTRAVENOUS
  Filled 2023-02-12: qty 13.6

## 2023-02-12 MED ORDER — CLOPIDOGREL BISULFATE 75 MG PO TABS
75.0000 mg | ORAL_TABLET | Freq: Every day | ORAL | Status: DC
  Administered 2023-02-13: 75 mg via ORAL
  Filled 2023-02-12: qty 1

## 2023-02-12 MED ORDER — AMLODIPINE BESYLATE 5 MG PO TABS
10.0000 mg | ORAL_TABLET | Freq: Once | ORAL | Status: AC
  Administered 2023-02-12: 10 mg via ORAL
  Filled 2023-02-12: qty 2

## 2023-02-12 MED ORDER — LIDOCAINE HCL (PF) 1 % IJ SOLN
INTRAMUSCULAR | Status: AC
  Filled 2023-02-12: qty 30

## 2023-02-12 MED ORDER — IOHEXOL 350 MG/ML SOLN
INTRAVENOUS | Status: DC | PRN
  Administered 2023-02-12: 75 mL

## 2023-02-12 MED ORDER — CLOPIDOGREL BISULFATE 75 MG PO TABS: 75.0000 mg | ORAL_TABLET | ORAL | Status: DC

## 2023-02-12 MED ORDER — VENLAFAXINE HCL ER 75 MG PO CP24
75.0000 mg | ORAL_CAPSULE | Freq: Every day | ORAL | Status: DC
  Administered 2023-02-13: 75 mg via ORAL
  Filled 2023-02-12: qty 1

## 2023-02-12 MED ORDER — LIDOCAINE HCL (PF) 1 % IJ SOLN
INTRAMUSCULAR | Status: DC | PRN
  Administered 2023-02-12: 5 mL

## 2023-02-12 MED ORDER — HYDRALAZINE HCL 20 MG/ML IJ SOLN
10.0000 mg | INTRAMUSCULAR | Status: AC | PRN
  Administered 2023-02-12: 10 mg via INTRAVENOUS
  Filled 2023-02-12: qty 1

## 2023-02-12 MED ORDER — ENOXAPARIN SODIUM 40 MG/0.4ML IJ SOSY: 40.0000 mg | PREFILLED_SYRINGE | INTRAMUSCULAR | Status: DC

## 2023-02-12 MED ORDER — POLYSACCHARIDE IRON COMPLEX 150 MG PO CAPS
150.0000 mg | ORAL_CAPSULE | Freq: Every day | ORAL | Status: DC
  Administered 2023-02-13: 150 mg via ORAL
  Filled 2023-02-12: qty 1

## 2023-02-12 MED ORDER — LABETALOL HCL 5 MG/ML IV SOLN
10.0000 mg | INTRAVENOUS | Status: AC | PRN
  Administered 2023-02-12: 10 mg via INTRAVENOUS

## 2023-02-12 MED ORDER — PANTOPRAZOLE SODIUM 40 MG PO TBEC
40.0000 mg | DELAYED_RELEASE_TABLET | Freq: Two times a day (BID) | ORAL | Status: DC
  Administered 2023-02-12 – 2023-02-13 (×2): 40 mg via ORAL
  Filled 2023-02-12 (×2): qty 1

## 2023-02-12 SURGICAL SUPPLY — 24 items
BALL SAPPHIRE NC24 4.0X8 (BALLOONS) ×1
BALLN SCOREFLEX 3.50X10 (BALLOONS) ×1
BALLOON SAPPHIRE NC24 4.0X8 (BALLOONS) IMPLANT
BALLOON SCOREFLEX 3.50X10 (BALLOONS) IMPLANT
CATH LAUNCHER 6FR JR4 (CATHETERS) IMPLANT
CATH OPTICROSS HD (CATHETERS) IMPLANT
CATH TELEPORT (CATHETERS) IMPLANT
CROWN DIAMONDBACK CLASSIC 1.25 (BURR) IMPLANT
DEVICE RAD COMP TR BAND LRG (VASCULAR PRODUCTS) IMPLANT
ELECT DEFIB PAD ADLT CADENCE (PAD) IMPLANT
GLIDESHEATH SLEND SS 6F .021 (SHEATH) IMPLANT
GUIDEWIRE INQWIRE 1.5J.035X260 (WIRE) IMPLANT
INQWIRE 1.5J .035X260CM (WIRE) ×1
KIT ENCORE 26 ADVANTAGE (KITS) IMPLANT
KIT HEART LEFT (KITS) ×1 IMPLANT
PACK CARDIAC CATHETERIZATION (CUSTOM PROCEDURE TRAY) ×1 IMPLANT
SLED PULL BACK IVUS (MISCELLANEOUS) IMPLANT
STENT SYNERGY XD 3.50X16 (Permanent Stent) IMPLANT
SYNERGY XD 3.50X16 (Permanent Stent) ×1 IMPLANT
TRANSDUCER W/STOPCOCK (MISCELLANEOUS) ×1 IMPLANT
TUBING CIL FLEX 10 FLL-RA (TUBING) ×1 IMPLANT
WIRE ASAHI PROWATER 180CM (WIRE) IMPLANT
WIRE ASAHI PROWATER 300CM (WIRE) IMPLANT
WIRE VIPERWIRE COR FLEX .012 (WIRE) IMPLANT

## 2023-02-12 NOTE — Interval H&P Note (Signed)
History and Physical Interval Note:  02/12/2023 11:59 AM  [name]  has presented today for surgery, with the diagnosis of CAD.  The various methods of treatment have been discussed with the patient and family. After consideration of risks, benefits and other options for treatment, the patient has consented to  Procedure(s): CORONARY ATHERECTOMY (N/A) as a surgical intervention.  The patient's history has been reviewed, patient examined, no change in status, stable for surgery.  I have reviewed the patient's chart and labs.  Questions were answered to the patient's satisfaction.   Cath Lab Visit (complete for each Cath Lab visit)  Clinical Evaluation Leading to the Procedure:   ACS: Yes.    Non-ACS:    Anginal Classification: CCS III  Anti-ischemic medical therapy: Maximal Therapy (2 or more classes of medications)  Non-Invasive Test Results: No non-invasive testing performed  Prior CABG: No previous CABG        Theron Arista [facility] 02/12/2023 11:59 AM

## 2023-02-13 ENCOUNTER — Telehealth (HOSPITAL_COMMUNITY): Payer: Self-pay | Admitting: Pharmacy Technician

## 2023-02-13 ENCOUNTER — Encounter (HOSPITAL_COMMUNITY): Payer: Self-pay | Admitting: Cardiology

## 2023-02-13 ENCOUNTER — Other Ambulatory Visit (HOSPITAL_COMMUNITY): Payer: Self-pay

## 2023-02-13 DIAGNOSIS — I25110 Atherosclerotic heart disease of native coronary artery with unstable angina pectoris: Secondary | ICD-10-CM | POA: Diagnosis not present

## 2023-02-13 DIAGNOSIS — E782 Mixed hyperlipidemia: Secondary | ICD-10-CM | POA: Diagnosis not present

## 2023-02-13 DIAGNOSIS — N183 Chronic kidney disease, stage 3 unspecified: Secondary | ICD-10-CM | POA: Diagnosis not present

## 2023-02-13 DIAGNOSIS — I6521 Occlusion and stenosis of right carotid artery: Secondary | ICD-10-CM | POA: Diagnosis not present

## 2023-02-13 DIAGNOSIS — I200 Unstable angina: Secondary | ICD-10-CM | POA: Diagnosis not present

## 2023-02-13 DIAGNOSIS — E1151 Type 2 diabetes mellitus with diabetic peripheral angiopathy without gangrene: Secondary | ICD-10-CM | POA: Diagnosis not present

## 2023-02-13 DIAGNOSIS — I5022 Chronic systolic (congestive) heart failure: Secondary | ICD-10-CM | POA: Diagnosis not present

## 2023-02-13 DIAGNOSIS — E1122 Type 2 diabetes mellitus with diabetic chronic kidney disease: Secondary | ICD-10-CM | POA: Diagnosis not present

## 2023-02-13 DIAGNOSIS — I130 Hypertensive heart and chronic kidney disease with heart failure and stage 1 through stage 4 chronic kidney disease, or unspecified chronic kidney disease: Secondary | ICD-10-CM | POA: Diagnosis not present

## 2023-02-13 DIAGNOSIS — Z87891 Personal history of nicotine dependence: Secondary | ICD-10-CM | POA: Diagnosis not present

## 2023-02-13 LAB — CBC
HCT: 31.5 % — ABNORMAL LOW (ref 36.0–46.0)
Hemoglobin: 10.4 g/dL — ABNORMAL LOW (ref 12.0–15.0)
MCH: 30.7 pg (ref 26.0–34.0)
MCHC: 33 g/dL (ref 30.0–36.0)
MCV: 92.9 fL (ref 80.0–100.0)
Platelets: 149 10*3/uL — ABNORMAL LOW (ref 150–400)
RBC: 3.39 MIL/uL — ABNORMAL LOW (ref 3.87–5.11)
RDW: 13.6 % (ref 11.5–15.5)
WBC: 15.4 10*3/uL — ABNORMAL HIGH (ref 4.0–10.5)
nRBC: 0 % (ref 0.0–0.2)

## 2023-02-13 LAB — BASIC METABOLIC PANEL
Anion gap: 9 (ref 5–15)
BUN: 23 mg/dL (ref 8–23)
CO2: 16 mmol/L — ABNORMAL LOW (ref 22–32)
Calcium: 8.4 mg/dL — ABNORMAL LOW (ref 8.9–10.3)
Chloride: 110 mmol/L (ref 98–111)
Creatinine, Ser: 1.69 mg/dL — ABNORMAL HIGH (ref 0.44–1.00)
GFR, Estimated: 31 mL/min — ABNORMAL LOW (ref >60)
Glucose, Bld: 130 mg/dL — ABNORMAL HIGH (ref 70–99)
Potassium: 3.5 mmol/L (ref 3.5–5.1)
Sodium: 135 mmol/L (ref 135–145)

## 2023-02-13 MED ORDER — POTASSIUM CHLORIDE CRYS ER 20 MEQ PO TBCR
40.0000 meq | EXTENDED_RELEASE_TABLET | Freq: Once | ORAL | Status: AC
  Administered 2023-02-13: 40 meq via ORAL
  Filled 2023-02-13: qty 2

## 2023-02-13 MED ORDER — EMPAGLIFLOZIN 10 MG PO TABS
10.0000 mg | ORAL_TABLET | Freq: Every day | ORAL | 0 refills | Status: DC
  Filled 2023-02-13: qty 30, 30d supply, fill #0

## 2023-02-13 MED ORDER — FUROSEMIDE 20 MG PO TABS: 20.0000 mg | ORAL_TABLET | Freq: Every day | ORAL | 0 refills | Status: AC | PRN

## 2023-02-13 MED ORDER — PANCRELIPASE (LIP-PROT-AMYL) 12000-38000 UNITS PO CPEP: 36000.0000 [IU] | ORAL_CAPSULE | Freq: Every day | ORAL | Status: DC

## 2023-02-13 NOTE — Plan of Care (Signed)
  Problem: Education: Goal: Understanding of CV disease, CV risk reduction, and recovery process will improve Outcome: Progressing Goal: Individualized Educational Video(s) Outcome: Progressing   Problem: Activity: Goal: Ability to return to baseline activity level will improve Outcome: Progressing   Problem: Cardiovascular: Goal: Ability to achieve and maintain adequate cardiovascular perfusion will improve Outcome: Progressing   Problem: Health Behavior/Discharge Planning: Goal: Ability to safely manage health-related needs after discharge will improve Outcome: Progressing   Problem: Education: Goal: Knowledge of General Education information will improve Description: Including pain rating scale, medication(s)/side effects and non-pharmacologic comfort measures Outcome: Progressing   Problem: Health Behavior/Discharge Planning: Goal: Ability to manage health-related needs will improve Outcome: Progressing   Problem: Clinical Measurements: Goal: Ability to maintain clinical measurements within normal limits will improve Outcome: Progressing Goal: Will remain free from infection Outcome: Progressing Goal: Diagnostic test results will improve Outcome: Progressing Goal: Respiratory complications will improve Outcome: Progressing Goal: Cardiovascular complication will be avoided Outcome: Progressing   Problem: Activity: Goal: Risk for activity intolerance will decrease Outcome: Progressing   Problem: Nutrition: Goal: Adequate nutrition will be maintained Outcome: Progressing   Problem: Coping: Goal: Level of anxiety will decrease Outcome: Progressing   Problem: Elimination: Goal: Will not experience complications related to bowel motility Outcome: Progressing Goal: Will not experience complications related to urinary retention Outcome: Progressing   Problem: Pain Managment: Goal: General experience of comfort will improve Outcome: Progressing   Problem:  Safety: Goal: Ability to remain free from injury will improve Outcome: Progressing   Problem: Skin Integrity: Goal: Risk for impaired skin integrity will decrease Outcome: Progressing   

## 2023-02-13 NOTE — TOC Benefit Eligibility Note (Addendum)
Patient Product/process development scientist completed.    The patient is currently admitted and upon discharge could be taking Farxiga 10 mg.  The current 30 day co-pay is $0.00.   The patient is currently admitted and upon discharge could be taking Jardiance 10 mg.  The current 30 day co-pay is $0.00.   The patient is insured through [employer] Part D   This test claim was processed through [facility]- copay amounts may vary at other pharmacies due to pharmacy/plan contracts, or as the patient moves through the different stages of their insurance plan.  Roland Earl, CPHT Pharmacy Patient Advocate Specialist [facility] Patient Advocate Team Direct Number: [phone]  Fax: [phone]

## 2023-02-13 NOTE — Telephone Encounter (Signed)
Pharmacy Patient Advocate Encounter  Insurance verification completed.    The patient is insured through [employer] part D   The patient is currently admitted and ran test claims for the following: Farxiga, Jardiance.  Copays and coinsurance results were relayed to Inpatient clinical team.

## 2023-02-13 NOTE — Progress Notes (Signed)
Patient given discharge instructions and verbalized understanding. PIV x 2 removed and patient dressed herself. Patient awaiting son for transportation home.

## 2023-02-13 NOTE — Care Management (Signed)
  Transition of Care [facility]) Screening Note   Patient Details  Name: [name] Date of Birth: 06/08/1946   Transition of Care [facility]) CM/SW Contact:    Gala Lewandowsky, RN Phone Number: 02/13/2023, 10:06 AM    Transition of Care Department [facility]) has reviewed the patient and no TOC needs have been identified at this time. Patient post cath. PTA patient is from home with family support. No home needs identified at this time.

## 2023-02-13 NOTE — Plan of Care (Signed)
  Problem: Activity: Goal: Ability to return to baseline activity level will improve Outcome: Progressing   Problem: Cardiovascular: Goal: Ability to achieve and maintain adequate cardiovascular perfusion will improve Outcome: Progressing Goal: Vascular access site(s) Level 0-1 will be maintained Outcome: Completed/Met   

## 2023-02-13 NOTE — Progress Notes (Signed)
CARDIAC REHAB PHASE I   PRE:  Rate/Rhythm: 87 NSR  BP:  Sitting: 125/67      SaO2: 99 RA  MODE:  Ambulation: 120 ft   AD:  None  POST:  Rate/Rhythm: 108 ST  BP:  Sitting: 127/67      SaO2: 99 RA  Pt amb with standby assist, pt walked well only c/o minimal SOB which she reports is "normal"  Pt was educated on stent card, stent location, Plavix and ASA use, wt restrictions, no baths/daily wash-ups, s/s of infection, ex guidelines, s/s to stop exercising, NTG use and calling 911, heart healthy diet, diabetic diet,  and CRPII. Pt received materials on exercise, diet, and CRPII. Will refer to [facility].     Faustino Congress  9:10 AM 02/13/2023    Service time is from 0848 to 0913.

## 2023-02-13 NOTE — Plan of Care (Signed)

## 2023-02-13 NOTE — Discharge Summary (Addendum)
Discharge Summary    Patient ID: [name] MRN: [id number]; DOB: 1946/02/11  Admit date: 02/12/2023 Discharge date: 02/13/2023  PCP:  Garlan Fillers, MD   [facility] Providers Cardiologist:  Peter Swaziland, MD        Discharge Diagnoses    Principal Problem:   Unstable angina [facility])    Diagnostic Studies/Procedures    02/16/23 LHC with PCI    Ost RCA lesion is 95% stenosed.   Prox RCA lesion is 30% stenosed.   A drug-eluting stent was successfully placed using a SYNERGY XD 3.50X16.   Post intervention, there is a 0% residual stenosis.   Successful PCI of the ostial RCA with DES x 1 with IVUS guidance and orbital atherectomy.   Plan: will observe overnight. DAPT continue at least 12 months. Check renal function in am.    Diagnostic Dominance: Right  Intervention   _____________   History of Present Illness     [name] is a 77 y.o. female with PMH of carotid artery disease with known occlusion of left ICA, CAD, DM 2, diabetes insipidus, dizziness, HTN, GERD, chronic pancreatitis, CVA, PAD with stent to the left SFA, and obstructive sleep apnea. She has known history of coronary artery disease with 60-70% stenosis in mid LAD. Cardiac catheterization in 1999, 2002, 2006, 2012 showed stable anatomy.    Myoview 2020  showed normal perfusion. EF by Myoview and Echo was mildly impaired with EF 45-50%. Patient seen by Dr. Swaziland for follow up on 4/11 and noted progressive mid sternal chest pain with exertion along with shortness of breath. Given symptoms, plan made for LHC. Patient underwent LHC on 4/17, found with critical ostial RCA stenosis. Staged PCI of RCA recommended due to complexity of lesion.      Hospital Course     Consultants: n/a   CAD Dyslipidemia  Has remote cath showing 60 to 70% stenosis in the LAD on multiple previous cardiac catheterizations. Presented for LHC due to accelerating dyspnea. Patient with staged PCI of RCA as  above, successful placement of DES x 1 with IVUS guidance and orbital atherectomy. On the morning of discharge, patient without chest pain or dyspnea. She will discharge on the following CAD GDMT:  ASA 81mg  with Plavix 75mg  for at least 1 year Metoprolol Succinate 100mg  Imdur 60mg  Atorvastatin 40mg  PRN nitroglycerin  Chronic HFmrEF  12/26/21 TTE with LVEF 45-50%. On day of discharge, patient euvolemic. Will discharge on the following HF GDMT:  Metoprolol Succinate 100mg  Losartan 50mg  QD. Could consider Entresto in outpatient setting Start Jardiance 10mg  Will continue Lasix 20mg . May be able to cut back to PRN dosing with SGLT2. MRA deferred due to CKD  Hypertension  BP at goal. Continue Amlodipine 10mg , Losartan 50mg , Hydralazine 100mg  TID, Metoprolol Succinate 100mg   CKD stage III-IV  Patient now followed by nephrology. Creatinine this admission stable: 1.80->1.69 (recent baseline 1.7-1.9).      Did the patient have an acute coronary syndrome (MI, NSTEMI, STEMI, etc) this admission?:  No                               Did the patient have a percutaneous coronary intervention (stent / angioplasty)?:  Yes.     Cath/PCI Registry Performance & Quality Measures: Aspirin prescribed? - Yes ADP Receptor Inhibitor (Plavix/Clopidogrel, Brilinta/Ticagrelor or Effient/Prasugrel) prescribed (includes medically managed patients)? - Yes High Intensity Statin (Lipitor 40-80mg  or Crestor 20-40mg ) prescribed? -  Yes For EF <40%, was ACEI/ARB prescribed? - Yes For EF <40%, Aldosterone Antagonist (Spironolactone or Eplerenone) prescribed? - No - Reason:  CKD Cardiac Rehab Phase II ordered? - Yes       The patient will be scheduled for a TOC follow up appointment in 14 days.  A message has been sent to the [facility] and Scheduling Pool at the office where the patient should be seen for follow up.  _____________  Discharge Vitals Blood pressure (!) 146/59, pulse 83, temperature (!) 97.5 F  (36.4 C), temperature source Oral, resp. rate 14, height 5\' 3"  (1.6 m), weight 68 kg, SpO2 95 %.  Filed Weights   02/12/23 0700  Weight: 68 kg   Physical Exam Vitals reviewed.  Constitutional:      Appearance: Normal appearance.  HENT:     Head: Normocephalic.     Nose: Nose normal.  Eyes:     Pupils: Pupils are equal, round, and reactive to light.  Cardiovascular:     Rate and Rhythm: Normal rate and regular rhythm.     Pulses: Normal pulses.     Heart sounds: Murmur heard.  Pulmonary:     Effort: Pulmonary effort is normal.     Breath sounds: Normal breath sounds.  Abdominal:     General: Abdomen is flat.  Musculoskeletal:        General: No swelling. Normal range of motion.     Cervical back: Normal range of motion and neck supple.  Skin:    General: Skin is warm and dry.     Capillary Refill: Capillary refill takes less than 2 seconds.  Neurological:     General: No focal deficit present.     Mental Status: She is alert and oriented to person, place, and time.  Psychiatric:        Mood and Affect: Mood normal.        Behavior: Behavior normal.        Thought Content: Thought content normal.        Judgment: Judgment normal.      Labs & Radiologic Studies    CBC Recent Labs    02/12/23 1223 02/13/23 0325  WBC  --  15.4*  HGB 12.2 10.4*  HCT 36.0 31.5*  MCV  --  92.9  PLT  --  149*   Basic Metabolic Panel Recent Labs    96/04/54 1223 02/13/23 0325  NA 138 135  K 4.0 3.5  CL 108 110  CO2  --  16*  GLUCOSE 165* 130*  BUN 19 23  CREATININE 1.80* 1.69*  CALCIUM  --  8.4*   Liver Function Tests No results for input(s): "AST", "ALT", "ALKPHOS", "BILITOT", "PROT", "ALBUMIN" in the last 72 hours. No results for input(s): "LIPASE", "AMYLASE" in the last 72 hours. High Sensitivity Troponin:   No results for input(s): "TROPONINIHS" in the last 720 hours.  BNP Invalid input(s): "POCBNP" D-Dimer No results for input(s): "DDIMER" in the last 72  hours. Hemoglobin A1C No results for input(s): "HGBA1C" in the last 72 hours. Fasting Lipid Panel No results for input(s): "CHOL", "HDL", "LDLCALC", "TRIG", "CHOLHDL", "LDLDIRECT" in the last 72 hours. Thyroid Function Tests No results for input(s): "TSH", "T4TOTAL", "T3FREE", "THYROIDAB" in the last 72 hours.  Invalid input(s): "FREET3" _____________  CARDIAC CATHETERIZATION  Result Date: 02/13/2023   Ost RCA lesion is 95% stenosed.   Prox RCA lesion is 30% stenosed.   A drug-eluting stent was successfully placed using a SYNERGY XD  3.50X16.   Post intervention, there is a 0% residual stenosis. Successful PCI of the ostial RCA with DES x 1 with IVUS guidance and orbital atherectomy. Plan: will observe overnight. DAPT continue at least 12 months. Check renal function in am.   CARDIAC CATHETERIZATION  Result Date: 01/29/2023   Mid LAD lesion is 45% stenosed.   Ost RCA lesion is 95% stenosed.   Prox RCA lesion is 30% stenosed.   LV end diastolic pressure is normal. Critical ostial RCA stenosis. Otherwise nonobstructive CAD Normal LVEDP Plan: recommend staged PCI of the ostial RCA. This is a complex lesion due to ostial location and calcification. Will require plaque modification with atherectomy. Plan to return in 2 weeks provided renal function is stable. Will add Imdur 60 mg daily.   Disposition   Pt is being discharged home today in good condition.  Follow-up Plans & Appointments     Discharge Instructions     Amb Referral to Cardiac Rehabilitation   Complete by: As directed    Diagnosis: Coronary Stents   After initial evaluation and assessments completed: Virtual Based Care may be provided alone or in conjunction with Phase 2 Cardiac Rehab based on patient barriers.: Yes   Intensive Cardiac Rehabilitation (ICR) [facility] location only OR Traditional Cardiac Rehabilitation (TCR) *If criteria for ICR are not met will enroll in TCR [facility] only): Yes        Discharge Medications    Allergies as of 02/13/2023       Reactions   Adhesive [tape] Other (See Comments)   "Took off my skin"   Other Rash   "Took off my skin"   Ace Inhibitors Other (See Comments)   Unknown   Sulfa Antibiotics Other (See Comments)   Unknown   Topiramate    unknown   Codeine Other (See Comments)   Altered mental state    Iodinated Contrast Media Rash   Latex Rash        Medication List     STOP taking these medications    predniSONE 50 MG tablet Commonly known as: DELTASONE       TAKE these medications    acetaminophen 325 MG tablet Commonly known as: TYLENOL Take 650 mg by mouth every 6 (six) hours as needed for mild pain.   albuterol 108 (90 Base) MCG/ACT inhaler Commonly known as: VENTOLIN HFA Inhale 1-2 puffs into the lungs every 6 (six) hours as needed for wheezing or shortness of breath.   amLODipine 10 MG tablet Commonly known as: NORVASC Take 10 mg by mouth daily.   aspirin 81 MG chewable tablet Chew 1 tablet (81 mg total) by mouth daily.   atorvastatin 40 MG tablet Commonly known as: LIPITOR Take 1 tablet (40 mg total) by mouth daily.   cholestyramine 4 g packet Commonly known as: QUESTRAN Take 0.5 packets by mouth in the morning and at bedtime.   clopidogrel 75 MG tablet Commonly known as: PLAVIX Take 1 tablet (75 mg total) by mouth daily.   [employer] Digestive Probiotic 250 MG capsule Generic drug: saccharomyces boulardii Take 500 mg by mouth daily.   cyanocobalamin 500 MCG tablet Commonly known as: VITAMIN B12 Take 500 mcg by mouth daily.   empagliflozin 10 MG Tabs tablet Commonly known as: Jardiance Take 1 tablet (10 mg total) by mouth daily before breakfast.   fluticasone 50 MCG/ACT nasal spray Commonly known as: FLONASE Place 2 sprays into both nostrils daily as needed for allergies.   furosemide 20 MG tablet Commonly  known as: LASIX Take 1 tablet (20 mg total) by mouth daily as needed. What changed:  when to take this reasons to  take this   hydrALAZINE 100 MG tablet Commonly known as: APRESOLINE Take 1 tablet (100 mg total) by mouth every 8 (eight) hours.   isosorbide mononitrate 60 MG 24 hr tablet Commonly known as: IMDUR Take 1 tablet (60 mg total) by mouth daily.   levETIRAcetam 500 MG tablet Commonly known as: KEPPRA TAKE 1 TABLET BY MOUTH TWICE A DAY   losartan 50 MG tablet Commonly known as: COZAAR TAKE ONE TABLET BY MOUTH EVERY DAY   magnesium oxide 400 MG tablet Commonly known as: MAG-OX Take 1 tablet by mouth daily.   meclizine 25 MG tablet Commonly known as: ANTIVERT Take 0.5 tablets (12.5 mg total) by mouth 3 (three) times daily as needed for dizziness.   metoprolol succinate 100 MG 24 hr tablet Commonly known as: TOPROL-XL Take 100 mg by mouth daily.   mirtazapine 15 MG tablet Commonly known as: REMERON Take 15 mg by mouth at bedtime.   montelukast 10 MG tablet Commonly known as: SINGULAIR Take 10 mg by mouth daily.   nitroGLYCERIN 0.4 MG SL tablet Commonly known as: NITROSTAT Place 1 tablet (0.4 mg total) under the tongue every 5 (five) minutes as needed for chest pain.   ondansetron 4 MG tablet Commonly known as: ZOFRAN Take 1 tablet (4 mg total) by mouth every 6 (six) hours as needed for nausea or vomiting.   pantoprazole 40 MG tablet Commonly known as: PROTONIX Take 1 tablet (40 mg total) by mouth 2 (two) times daily.   Polysaccharide-Iron Complex 150 MG Caps Take 1 tablet by mouth daily.   venlafaxine XR 75 MG 24 hr capsule Commonly known as: EFFEXOR-XR Take 1 capsule (75 mg total) by mouth daily.   Vitamin D (Ergocalciferol) 1.25 MG (50000 UNIT) Caps capsule Commonly known as: DRISDOL Take 50,000 Units by mouth every Wednesday.   Zenpep 40000-126000 units Cpep Generic drug: Pancrelipase (Lip-Prot-Amyl) Take 2 capsules with meals and one capsule with snacks. What changed:  how much to take how to take this when to take this additional instructions            Outstanding Labs/Studies    Duration of Discharge Encounter   Greater than 30 minutes including physician time.  Signed, Perlie Gold, PA-C 02/13/2023, 11:00 AM   ATTENDING ATTESTATION:  After conducting a review of all available clinical information with the care team, interviewing the patient, and performing a physical exam, I agree with the findings and plan described in this note.   GEN: No acute distress.   HEENT:  MMM, no JVD, no scleral icterus Cardiac: RRR, no murmurs, rubs, or gallops.  Respiratory: Clear to auscultation bilaterally. GI: Soft, nontender, non-distended  MS: No edema; No deformity. 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  Creatinine is stable today.  Will discharge today with outpatient follow-up.  Will start Jardiance given LV dysfunction.  Continue losartan and Toprol.  Alverda Skeans, MD Pager [phone]

## 2023-02-15 LAB — LIPOPROTEIN A (LPA): Lipoprotein (a): 145.9 nmol/L — ABNORMAL HIGH (ref <75.0)

## 2023-02-15 NOTE — Progress Notes (Unsigned)
Cardiology Office Note    Date:  02/18/2023   ID:  [name], [place] July 26, 1946, MRN [id number]  PCP:  Garlan Fillers, MD  Cardiologist:  Dr. [place]  Chief Complaint  Patient presents with   Hospitalization Follow-up    History of Present Illness:  DELLE Strong is a 77 y.o. female with PMH of carotid artery disease with known occlusion of left ICA, CAD, DM 2, diabetes insipidus, dizziness, HTN, GERD, chronic pancreatitis, CVA, PAD with stent to the left SFA, and obstructive sleep apnea. She has known history of coronary artery disease with 60-70% stenosis in mid LAD. Cardiac catheterization in 1999, 2002, 2006, 2012 showed stable anatomy.    Myoview 2020  showed normal perfusion. EF by Myoview and Echo was mildly impaired with EF 45-50%.   She was admitted in November 2020 with AKI due to high output ileostomy and ARB. In January 2021 she was admitted with acute respiratory failure and encephalopathy related to Covid 19 PNA. She did have  bowel resection x 3 for bowel obstruction 2 years ago.   She is status post exploratory laparotomy, lysis of adhesions, end ileostomy takedown, small bowel resection and ileocolonic anastomosis on 03/23/21 in [place].  Pt presented to ED in June 2023 with c/o generalized weakness over past couple of days mild nausea, in the ER she was found to have extremely elevated blood pressures.  MRI brain was unremarkable, CTA head and neck was nonacute except some nonspecific chest findings for which it was recommended a repeat CT chest in 3 weeks by PCP.    She was readmitted on April 15, 2022 when she presented  with strokelike symptoms having visited her PCP with right arm numbness slurred speech and given her known history and risk factors she was sent to the ED for further evaluation and treatment.  Neurology consulted -imaging initially unremarkable however CTA and transcranial doppler shows a string sign concerning for imminent vascular compromise  prompting vascular consultation and subsequent right TCAR 7/6 by Dr. Karin Lieu. EEG was negative. DC on ASA and Plavix.   She was admitted in March this year with symptoms of vertigo and nausea with abdominal pain. Extensive evaluation negative. BP elevated but improved with resuming hydralazine. MRI did suggest significant RICA stenosis but was seen by Dr Karin Lieu in follow up. He felt doppler was OK and recommended follow up in 6 months.  When seen this spring she noted over the past month progressive mid sternal chest pain with any exertion. Relieved with rest after 5-10 minutes. States it has gotten worse.  Quit smoking several years ago. Is  SOB. This led to cardiac cath showing critical ostial RCA stenosis with heavy calcification. Due to CKD she returned for staged PCI with atherectomy on 02/12/23. This procedure went well. On follow up she notes a couple of episodes of chest pain but generally it is much better than before. Her blood pressure is still running high. She is still sore on her left side from when she fell at home prior to her PCI.      Past Medical History:  Diagnosis Date   Anxiety    Arthritis    back    Blind loop syndrome    Clotting disorder (HCC)    Hx Clot in leg per pt    Colostomy present [facility])    Coronary artery disease    a. known 60-70% mid-LAD stenosis with caths in 1999, 2002, 2006, and 2012 showing stable anatomy  b. 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HCC)    Diabetes mellitus    Diverticulosis    Dizziness    chronic   Dyslipidemia    Fatty liver    GERD (gastroesophageal reflux disease)    hiatial hernia   Heart murmur    Hiatal hernia    Hypertension    Irritable bowel syndrome    Pancreatitis, chronic (HCC)    Peripheral vascular disease (HCC) 02/20/2010   s/p stent of left SFA; carotid stenosis   Seizure (HCC) 11/04/2018   last 1 yr 2020 per pt unsure month    Sleep apnea    no cpap    Stroke [facility])    Thyroid nodule     Vitamin B12 deficiency     Past Surgical History:  Procedure Laterality Date   ABDOMINAL HYSTERECTOMY     partial   AUGMENTATION MAMMAPLASTY     bilateral 1976 retro    BREAST ENHANCEMENT SURGERY     CARDIAC CATHETERIZATION  07/07/98;08/31/01;03/04/05   60 -70% LAD   CATARACT EXTRACTION Bilateral    COLON RESECTION N/A 10/14/2017   Procedure: EXPLORATORY LAPAROTOMY, EXTENDED RIGHT COLECTOMY; APPLICATION OF ABDOMINAL VACUUM DRESSING;  Surgeon: Griselda Miner, MD;  Location: [facility] ORS;  Service: General;  Laterality: N/A;   COLONOSCOPY     CORONARY ATHERECTOMY N/A 02/12/2023   Procedure: CORONARY ATHERECTOMY;  Surgeon: Sw[place]il[place], Kearney Evitt M, MD;  Location: [facility] INVASIVE CV LAB;  Service: Cardiovascular;  Laterality: N/A;   CORONARY STENT INTERVENTION N/A 02/12/2023   Procedure: CORONARY STENT INTERVENTION;  Surgeon: [place], Twila Rappa M, MD;  Location: [facility] INVASIVE CV LAB;  Service: Cardiovascular;  Laterality: N/A;   CORONARY ULTRASOUND/IVUS N/A 02/12/2023   Procedure: Coronary Ultrasound/IVUS;  Surgeon: [place], Ranjit Ashurst M, MD;  Location: [facility] INVASIVE CV LAB;  Service: Cardiovascular;  Laterality: N/A;   ENDOBRONCHIAL ULTRASOUND Bilateral 02/24/2017   Procedure: ENDOBRONCHIAL ULTRASOUND;  Surgeon: Leslye Peer, MD;  Location: [facility] ENDOSCOPY;  Service: Cardiopulmonary;  Laterality: Bilateral;   FEMORAL ARTERY STENT     Left leg   LAPAROTOMY N/A 10/16/2017   Procedure: EXPLORATORY LAPAROTOMY, PARTIAL OMENTECTOMY, RESECTION ISCHEMIC ILEUM 130CM, APPLICATION OF VAC ABDOMINAL DRESSING;  Surgeon: Darnell Level, MD;  Location: [facility] ORS;  Service: General;  Laterality: N/A;   LAPAROTOMY N/A 10/18/2017   Procedure: RE-EXPLORATION OF ABDOMEN, ILEOSTOMY CREATION;  Surgeon: Almond Lint, MD;  Location: [facility] ORS;  Service: General;  Laterality: N/A;   LEFT HEART CATH AND CORONARY ANGIOGRAPHY N/A 01/29/2023   Procedure: LEFT HEART CATH AND CORONARY ANGIOGRAPHY;  Surgeon: [place], Jakyah Bradby M, MD;  Location: [facility] INVASIVE CV LAB;   Service: Cardiovascular;  Laterality: N/A;   LUNG SURGERY  03/2017   Benign polyps removed   PATELLA REALIGNMENT Left    POLYPECTOMY     SIGMOIDOSCOPY     TONSILLECTOMY     TRANSCAROTID ARTERY REVASCULARIZATION  Right 04/18/2022   Procedure: Transcarotid Artery Revascularization;  Surgeon: Victorino Sparrow, MD;  Location: [facility] OR;  Service: Vascular;  Laterality: Right;    Current Medications: Outpatient Medications Prior to Visit  Medication Sig Dispense Refill   acetaminophen (TYLENOL) 325 MG tablet Take 650 mg by mouth every 6 (six) hours as needed for mild pain.     albuterol (VENTOLIN HFA) 108 (90 Base) MCG/ACT inhaler Inhale 1-2 puffs into the lungs every 6 (six) hours as needed for wheezing or shortness of breath.     amLODipine (NORVASC) 10 MG tablet Take 10 mg  by mouth daily.     aspirin 81 MG chewable tablet Chew 1 tablet (81 mg total) by mouth daily.     atorvastatin (LIPITOR) 40 MG tablet Take 1 tablet (40 mg total) by mouth daily. 30 tablet 0   clopidogrel (PLAVIX) 75 MG tablet Take 1 tablet (75 mg total) by mouth daily. 30 tablet 0   [employer] DIGESTIVE PROBIOTIC 250 MG capsule Take 500 mg by mouth daily.     empagliflozin (JARDIANCE) 10 MG TABS tablet Take 1 tablet (10 mg total) by mouth daily before breakfast. 30 tablet 0   esomeprazole (NEXIUM) 40 MG capsule Take 40 mg by mouth 2 (two) times daily before a meal.     fluticasone (FLONASE) 50 MCG/ACT nasal spray Place 2 sprays into both nostrils daily as needed for allergies.     furosemide (LASIX) 20 MG tablet Take 1 tablet (20 mg total) by mouth daily as needed. 30 tablet 0   hydrALAZINE (APRESOLINE) 100 MG tablet Take 1 tablet (100 mg total) by mouth every 8 (eight) hours. 90 tablet 0   HYDROcodone-acetaminophen (NORCO) 10-325 MG tablet Take 1 tablet by mouth every 6 (six) hours as needed. PRN     isosorbide mononitrate (IMDUR) 60 MG 24 hr tablet Take 1 tablet (60 mg total) by mouth daily. 30 tablet 4   levETIRAcetam (KEPPRA) 500  MG tablet TAKE 1 TABLET BY MOUTH TWICE A DAY (Patient taking differently: Take 500 mg by mouth 2 (two) times daily.) 180 tablet 2   losartan (COZAAR) 50 MG tablet TAKE ONE TABLET BY MOUTH EVERY DAY 30 tablet 11   magnesium oxide (MAG-OX) 400 MG tablet Take 1 tablet by mouth daily.     meclizine (ANTIVERT) 25 MG tablet Take 0.5 tablets (12.5 mg total) by mouth 3 (three) times daily as needed for dizziness. 30 tablet 0   metoprolol succinate (TOPROL-XL) 100 MG 24 hr tablet Take 100 mg by mouth daily.     mirtazapine (REMERON) 15 MG tablet Take 15 mg by mouth at bedtime.     montelukast (SINGULAIR) 10 MG tablet Take 10 mg by mouth daily.     nitroGLYCERIN (NITROSTAT) 0.4 MG SL tablet Place 1 tablet (0.4 mg total) under the tongue every 5 (five) minutes as needed for chest pain. 90 tablet 3   ondansetron (ZOFRAN) 4 MG tablet Take 1 tablet (4 mg total) by mouth every 6 (six) hours as needed for nausea or vomiting. 40 tablet 1   Pancrelipase, Lip-Prot-Amyl, (ZENPEP) 40000-126000 units CPEP Take 2 capsules with meals and one capsule with snacks. (Patient taking differently: Take 1 capsule by mouth daily with breakfast.) 240 capsule 0   pantoprazole (PROTONIX) 40 MG tablet Take 1 tablet (40 mg total) by mouth 2 (two) times daily. 180 tablet 2   Polysaccharide-Iron Complex 150 MG CAPS Take 1 tablet by mouth daily.     venlafaxine XR (EFFEXOR-XR) 75 MG 24 hr capsule Take 1 capsule (75 mg total) by mouth daily.     vitamin B-12 (CYANOCOBALAMIN) 500 MCG tablet Take 500 mcg by mouth daily.     Vitamin D, Ergocalciferol, (DRISDOL) 1.25 MG (50000 UT) CAPS capsule Take 50,000 Units by mouth every Wednesday.      No facility-administered medications prior to visit.     Allergies:   Adhesive [tape], Other, Ace inhibitors, Sulfa antibiotics, Topiramate, Codeine, Iodinated contrast media, and Latex   Social History   Socioeconomic History   Marital status: Divorced    Spouse name: Not on  file   Number of  children: 2   Years of education: Not on file   Highest education level: Not on file  Occupational History   Occupation: retired    Associate Professor: DISABLED  Tobacco Use   Smoking status: Former    Packs/day: 1.00    Years: 50.00    Additional pack years: 0.00    Total pack years: 50.00    Types: Cigarettes    Quit date: 10/14/2010    Years since quitting: 12.3   Smokeless tobacco: Never  Vaping Use   Vaping Use: Never used  Substance and Sexual Activity   Alcohol use: No   Drug use: No   Sexual activity: Not Currently    Comment: Hysterectomy  Other Topics Concern   Not on file  Social History Narrative   Not on file   Social Determinants of Health   Financial Resource Strain: Not on file  Food Insecurity: No Food Insecurity (02/12/2023)   Hunger Vital Sign    Worried About Running Out of Food in the Last Year: Never true    Ran Out of Food in the Last Year: Never true  Transportation Needs: No Transportation Needs (02/12/2023)   PRAPARE - Administrator, Civil Service (Medical): No    Lack of Transportation (Non-Medical): No  Physical Activity: Not on file  Stress: Not on file  Social Connections: Not on file     Family History:  The patient's family history includes Colon cancer in her paternal grandfather; Hypertension in her father and mother; Osteoarthritis in her mother; Pulmonary embolism in her mother; Stroke in her father and another family member.   ROS:   Please see the history of present illness.    ROS All other systems reviewed and are negative.   PHYSICAL EXAM:   VS:  BP (!) 161/72 (BP Location: Left Arm, Patient Position: Sitting, Cuff Size: Normal)   Pulse 69   Ht 5\' 3"  (1.6 m)   Wt 172 lb 3.2 oz (78.1 kg)   SpO2 95%   BMI 30.50 kg/m    GEN: Well nourished, obese, in no acute distress  HEENT: normal  Neck: no JVD, right carotid bruit, or masses Cardiac: RRR; no murmurs, rubs, or gallops,no edema. Right radial site is bruised without  hematoma. Pulse is intact.  Respiratory:  clear to auscultation bilaterally, normal work of breathing GI: soft, nontender, nondistended, + BS MS: no deformity or atrophy  Skin: warm and dry, no rash, no edema. Pulses 2+ equal.  Neuro:  Alert and Oriented x 3, Strength and sensation are intact Psych: euthymic mood, full affect  Wt Readings from Last 3 Encounters:  02/18/23 172 lb 3.2 oz (78.1 kg)  02/12/23 150 lb (68 kg)  01/29/23 150 lb (68 kg)      Studies/Labs Reviewed:   EKG:  EKG is not ordered today.     Recent Labs: 01/02/2023: ALT 15 01/04/2023: B Natriuretic Peptide 109.5; Magnesium 1.3 02/13/2023: BUN 23; Creatinine, Ser 1.69; Hemoglobin 10.4; Platelets 149; Potassium 3.5; Sodium 135   Lipid Panel    Component Value Date/Time   CHOL 98 01/03/2023 0625   TRIG 119 01/03/2023 0625   HDL 49 01/03/2023 0625   CHOLHDL 2.0 01/03/2023 0625   VLDL 24 01/03/2023 0625   LDLCALC 25 01/03/2023 0625   Dated 05/08/20: creatinine 1.9. otherwise CMET normal. A1c 5.6%.  Dated 09/05/21: cholesterol 107, triglycerides 163, HDL 45, LDL 29. A1c 5.4%. Dated 01/14/23: normal LFTs  Ecg today shows NSR rate 67. Nonspecific TWA. I have personally reviewed and interpreted this study.   Additional studies/ records that were reviewed today include:   Echo 10/25/2017   LV EF: 55% -   60%   ------------------------------------------------------------------- Indications:      CVA 436.   ------------------------------------------------------------------- History:   PMH:   Coronary artery disease.  Risk factors: Hypertension. Diabetes mellitus. Dyslipidemia.   ------------------------------------------------------------------- Study Conclusions   - Left ventricle: The cavity size was normal. Wall thickness was   normal. Systolic function was normal. The estimated ejection   fraction was in the range of 55% to 60%. Although no diagnostic   regional wall motion abnormality was identified,  this possibility   cannot be completely excluded on the basis of this study. Doppler   parameters are consistent with abnormal left ventricular   relaxation (grade 1 diastolic dysfunction). - Aortic valve: Poorly visualized. There was no stenosis. - Mitral valve: Mildly calcified annulus. There was trivial   regurgitation. - Right ventricle: The cavity size was normal. Systolic function   was normal. - Pulmonary arteries: No complete TR doppler jet so unable to   estimate PA systolic pressure. - Inferior vena cava: The vessel was normal in size. The   respirophasic diameter changes were in the normal range (>= 50%),   consistent with normal central venous pressure.   Impressions:   - Normal LV size with EF 55-60%. Normal RV size and systolic   function. No significant valvular abnormalities.  Myoview 12/18/18: Study Highlights    The left ventricular ejection fraction is mildly decreased (45-54%). Nuclear stress EF: 48%. No T wave inversion was noted during stress. There was no ST segment deviation noted during stress. This is an intermediate risk study.   No reversible ischemia. LVEF 48% with basal septal dyskinesis. This is an intermediate risk study (LVEF<49%) - recommend correlating findings with echo.   Echo 04/30/19:IMPRESSIONS     1. The left ventricle has mildly reduced systolic function, with an  ejection fraction of 45-50%. The cavity size was normal. There is mildly  increased left ventricular wall thickness. Left ventricular diastolic  Doppler parameters are consistent with  impaired relaxation. Indeterminate filling pressures The E/e' is 8-15.   2. Moderate hypokinesis of the left ventricular, basal-mid inferolateral  wall.   3. The right ventricle has normal systolic function. The cavity was  normal. There is no increase in right ventricular wall thickness.   4. The mitral valve is abnormal. Mild thickening of the mitral valve  leaflet. Mild calcification of  the mitral valve leaflet. There is mild  mitral annular calcification present.   5. The tricuspid valve is grossly normal.   6. The aortic valve is grossly normal. No stenosis of the aortic valve.   7. The aortic root and ascending aorta are normal in size and structure.   8. The inferior vena cava was normal in size with <50% respiratory  variability.   9. The average left ventricular global longitudinal strain is -18.6 %.  10. When compared to the prior study: 10/25/17 EF 55-60%.     Echocardiogram 11/17/2020 IMPRESSIONS     1. Left ventricular ejection fraction, by estimation, is 45 to 50%. The  left ventricle has mildly decreased function. The left ventricle  demonstrates regional wall motion abnormalities (see scoring  diagram/findings for description). There is moderate  left ventricular hypertrophy.   2. The mitral valve was not well visualized. No evidence of mitral valve  regurgitation.  3. The aortic valve is tricuspid. Mild aortic valve sclerosis is present,  with no evidence of aortic valve stenosis.   Comparison(s): Prior images reviewed side by side. Changes from prior  study are noted.   Echo 12/26/21: 1. Left ventricular ejection fraction, by estimation, is 45 to 50%. The  left ventricle has mildly decreased function. The left ventricle  demonstrates regional wall motion abnormalities. Apical hypokinesis. There  is mild left ventricular hypertrophy.  Left ventricular diastolic parameters are indeterminate.   2. Right ventricular systolic function is normal. The right ventricular  size is normal. Tricuspid regurgitation signal is inadequate for assessing  PA pressure.   3. The mitral valve is normal in structure. No evidence of mitral valve  regurgitation.   4. The aortic valve was not well visualized. Aortic valve regurgitation  is not visualized. No aortic stenosis is present.   5. The inferior vena cava is normal in size with greater than 50%  respiratory  variability, suggesting right atrial pressure of 3 mmHg.    Cardiac cath 01/29/23:  LEFT HEART CATH AND CORONARY ANGIOGRAPHY   Conclusion      Mid LAD lesion is 45% stenosed.   Ost RCA lesion is 95% stenosed.   Prox RCA lesion is 30% stenosed.   LV end diastolic pressure is normal.   Critical ostial RCA stenosis. Otherwise nonobstructive CAD Normal LVEDP   Plan: recommend staged PCI of the ostial RCA. This is a complex lesion due to ostial location and calcification. Will require plaque modification with atherectomy. Plan to return in 2 weeks provided renal function is stable. Will add Imdur 60 mg daily.    PCI 02/12/23: Conclusion      Ost RCA lesion is 95% stenosed.   Prox RCA lesion is 30% stenosed.   A drug-eluting stent was successfully placed using a SYNERGY XD [id number].   Post intervention, there is a 0% residual stenosis.   Successful PCI of the ostial RCA with DES x 1 with IVUS guidance and orbital atherectomy.   Plan: will observe overnight. DAPT continue at least 12 months. Check renal function in am.   Coronary Diagrams  Diagnostic Dominance: Right  Intervention    Implants   ASSESSMENT:    1. Coronary artery disease involving native coronary artery of native heart with unstable angina pectoris (HCC)   2. Right-sided extracranial carotid artery stenosis   3. Essential hypertension   4. Mixed hyperlipidemia   5. PAOD (peripheral arterial occlusive disease) (HCC)      PLAN:  In order of problems listed above:  1. CAD: She has progressive/unstable angina. She is on good medical therapy. On DAPT. She is s/p cardiac cath demonstrated critical ostial RCA stenosis. She underwent staged PCI of the RCA with atherectomy and stenting with excellent result.   2. Hypertension:  on metoprolol, amlodipine, losartan, hydralazine. BP still poorly controlled. Will add Clonidine 0.1 mg qhs.   3. Bilateral carotid artery disease: severe RICA stenosis now s/p TCAR. totally  occluded left carotid artery.  Followed closely by Dr Karin Lieu at [facility].   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 Will update BMET post PCI  5. History of ischemic bowel with obstruction. s/p resection and ileostomy with subsequent reanastomosis.   6. OSA- intolerant of CPAP  7. PAD. LE arterial dopplers in Dec looked good.   8. Dyslipidemia. On statin. Excellent control.   Follow up in 3 months with APP  Medication Adjustments/Labs and Tests Ordered: Current medicines are reviewed at  length with the patient today.  Concerns regarding medicines are outlined above.  Medication changes, Labs and Tests ordered today are listed in the Patient Instructions below. Patient Instructions  Medication Instructions:  Start Clonidine 0.1 mg every night Continue all other medications *If you need a refill on your cardiac medications before your next appointment, please call your pharmacy*   Lab Work: Bmet today   Testing/Procedures: None ordered   Follow-Up: At [facility], you and your health needs are our priority.  As part of our continuing mission to provide you with exceptional heart care, we have created designated Provider Care Teams.  These Care Teams include your primary Cardiologist (physician) and Advanced Practice Providers (APPs -  Physician Assistants and Nurse Practitioners) who all work together to provide you with the care you need, when you need it.  We recommend signing up for the patient portal called "MyChart".  Sign up information is provided on this After Visit Summary.  MyChart is used to connect with patients for Virtual Visits (Telemedicine).  Patients are able to view lab/test results, encounter notes, upcoming appointments, etc.  Non-urgent messages can be sent to your provider as well.   To learn more about what you can do with MyChart, go to [email or web address].    Your next appointment:  3 months    Provider:  Dr.Liat Mayol's PA     Signed, Roxanne Orner  [place], MD  02/18/2023 2:43 PM    [facility] [address] [place], [place], [place]  [address] Phone: [phone]; Fax: [phone]

## 2023-02-18 ENCOUNTER — Ambulatory Visit (INDEPENDENT_AMBULATORY_CARE_PROVIDER_SITE_OTHER): Payer: Medicare HMO | Admitting: Cardiology

## 2023-02-18 ENCOUNTER — Encounter: Payer: Self-pay | Admitting: Cardiology

## 2023-02-18 VITALS — BP 161/72 | HR 69 | Ht 63.0 in | Wt 172.2 lb

## 2023-02-18 DIAGNOSIS — I6521 Occlusion and stenosis of right carotid artery: Secondary | ICD-10-CM

## 2023-02-18 DIAGNOSIS — E782 Mixed hyperlipidemia: Secondary | ICD-10-CM

## 2023-02-18 DIAGNOSIS — I779 Disorder of arteries and arterioles, unspecified: Secondary | ICD-10-CM

## 2023-02-18 DIAGNOSIS — I10 Essential (primary) hypertension: Secondary | ICD-10-CM

## 2023-02-18 DIAGNOSIS — I25110 Atherosclerotic heart disease of native coronary artery with unstable angina pectoris: Secondary | ICD-10-CM | POA: Diagnosis not present

## 2023-02-18 MED ORDER — CLONIDINE HCL 0.1 MG PO TABS
0.10 mg | ORAL_TABLET | Freq: Every day | ORAL | 11 refills | Status: DC
Start: 2023-02-18 — End: 2023-04-20

## 2023-02-18 NOTE — Patient Instructions (Signed)
Medication Instructions:  Start Clonidine 0.1 mg every night Continue all other medications *If you need a refill on your cardiac medications before your next appointment, please call your pharmacy*   Lab Work: Bmet today   Testing/Procedures: None ordered   Follow-Up: At [facility], you and your health needs are our priority.  As part of our continuing mission to provide you with exceptional heart care, we have created designated Provider Care Teams.  These Care Teams include your primary Cardiologist (physician) and Advanced Practice Providers (APPs -  Physician Assistants and Nurse Practitioners) who all work together to provide you with the care you need, when you need it.  We recommend signing up for the patient portal called "MyChart".  Sign up information is provided on this After Visit Summary.  MyChart is used to connect with patients for Virtual Visits (Telemedicine).  Patients are able to view lab/test results, encounter notes, upcoming appointments, etc.  Non-urgent messages can be sent to your provider as well.   To learn more about what you can do with MyChart, go to [email or web address].    Your next appointment:  3 months    Provider:  Dr.Jordan's PA

## 2023-02-19 LAB — BASIC METABOLIC PANEL
BUN/Creatinine Ratio: 9 — ABNORMAL LOW (ref 12–28)
BUN: 16 mg/dL (ref 8–27)
CO2: 21 mmol/L (ref 20–29)
Calcium: 8.8 mg/dL (ref 8.7–10.3)
Chloride: 105 mmol/L (ref 96–106)
Creatinine, Ser: 1.85 mg/dL — ABNORMAL HIGH (ref 0.57–1.00)
Glucose: 103 mg/dL — ABNORMAL HIGH (ref 70–99)
Potassium: 5 mmol/L (ref 3.5–5.2)
Sodium: 141 mmol/L (ref 134–144)
eGFR: 28 mL/min/{1.73_m2} — ABNORMAL LOW (ref >59)

## 2023-02-24 ENCOUNTER — Encounter (HOSPITAL_COMMUNITY): Payer: Self-pay

## 2023-02-24 ENCOUNTER — Telehealth (HOSPITAL_COMMUNITY): Payer: Self-pay

## 2023-02-24 NOTE — Telephone Encounter (Signed)
Attempted to call patient in regards to Cardiac Rehab - LM on VM Mailed letter 

## 2023-02-26 DIAGNOSIS — E1122 Type 2 diabetes mellitus with diabetic chronic kidney disease: Secondary | ICD-10-CM | POA: Diagnosis not present

## 2023-02-26 DIAGNOSIS — D631 Anemia in chronic kidney disease: Secondary | ICD-10-CM | POA: Diagnosis not present

## 2023-02-26 DIAGNOSIS — N189 Chronic kidney disease, unspecified: Secondary | ICD-10-CM | POA: Diagnosis not present

## 2023-02-26 DIAGNOSIS — R609 Edema, unspecified: Secondary | ICD-10-CM | POA: Diagnosis not present

## 2023-02-26 DIAGNOSIS — I129 Hypertensive chronic kidney disease with stage 1 through stage 4 chronic kidney disease, or unspecified chronic kidney disease: Secondary | ICD-10-CM | POA: Diagnosis not present

## 2023-02-26 DIAGNOSIS — N1832 Chronic kidney disease, stage 3b: Secondary | ICD-10-CM | POA: Diagnosis not present

## 2023-02-27 LAB — LAB REPORT - SCANNED: EGFR: 21

## 2023-03-11 ENCOUNTER — Telehealth (HOSPITAL_COMMUNITY): Payer: Self-pay

## 2023-03-11 NOTE — Telephone Encounter (Signed)
LVM to call cardiac rehab at [phone]. 

## 2023-03-11 NOTE — Telephone Encounter (Signed)
Pt returned CR phone call and stated she is interested in CR. Patient will come in for orientation on 03/13/23 @ 1:15PM and will attend the 12:30PM exercise class.

## 2023-03-11 NOTE — Telephone Encounter (Signed)
Pt insurance is active and benefits verified through [facility]. Co-pay $0.00, DED $240.00/$240.00 met, out of pocket $8,850.00/$3,767.22 met, co-insurance 20%. No pre-authorization required. Passport, 03/11/23 @ 10:19AM, REF#[id number]   How many CR sessions are covered? (36 visits for TCR, 72 visits for ICR)72 Is this a lifetime maximum or an annual maximum? Annual Has the member used any of these services to date? No Is there a time limit (weeks/months) on start of program and/or program completion? No

## 2023-03-12 ENCOUNTER — Telehealth (HOSPITAL_COMMUNITY): Payer: Self-pay

## 2023-03-12 NOTE — Telephone Encounter (Signed)
LVM to call cardiac rehab at [phone]. 

## 2023-03-13 ENCOUNTER — Telehealth (HOSPITAL_COMMUNITY): Payer: Self-pay

## 2023-03-13 ENCOUNTER — Ambulatory Visit (HOSPITAL_COMMUNITY): Payer: Medicare HMO

## 2023-03-13 DIAGNOSIS — E1122 Type 2 diabetes mellitus with diabetic chronic kidney disease: Secondary | ICD-10-CM | POA: Diagnosis not present

## 2023-03-13 DIAGNOSIS — I129 Hypertensive chronic kidney disease with stage 1 through stage 4 chronic kidney disease, or unspecified chronic kidney disease: Secondary | ICD-10-CM | POA: Diagnosis not present

## 2023-03-13 DIAGNOSIS — D631 Anemia in chronic kidney disease: Secondary | ICD-10-CM | POA: Diagnosis not present

## 2023-03-13 DIAGNOSIS — R609 Edema, unspecified: Secondary | ICD-10-CM | POA: Diagnosis not present

## 2023-03-13 DIAGNOSIS — N1832 Chronic kidney disease, stage 3b: Secondary | ICD-10-CM | POA: Diagnosis not present

## 2023-03-13 LAB — LAB REPORT - SCANNED: EGFR: 29

## 2023-03-13 NOTE — Telephone Encounter (Signed)
Attempt to call pt regarding 1315 appt. Call went straight to VM, left message.   Jonna Coup, MS 03/13/2023 1:57 PM

## 2023-03-14 DIAGNOSIS — W19XXXA Unspecified fall, initial encounter: Secondary | ICD-10-CM | POA: Diagnosis not present

## 2023-03-14 DIAGNOSIS — D509 Iron deficiency anemia, unspecified: Secondary | ICD-10-CM | POA: Diagnosis not present

## 2023-03-14 DIAGNOSIS — J069 Acute upper respiratory infection, unspecified: Secondary | ICD-10-CM | POA: Diagnosis not present

## 2023-03-14 DIAGNOSIS — R051 Acute cough: Secondary | ICD-10-CM | POA: Diagnosis not present

## 2023-03-14 DIAGNOSIS — I739 Peripheral vascular disease, unspecified: Secondary | ICD-10-CM | POA: Diagnosis not present

## 2023-03-14 DIAGNOSIS — I130 Hypertensive heart and chronic kidney disease with heart failure and stage 1 through stage 4 chronic kidney disease, or unspecified chronic kidney disease: Secondary | ICD-10-CM | POA: Diagnosis not present

## 2023-03-14 DIAGNOSIS — M25512 Pain in left shoulder: Secondary | ICD-10-CM | POA: Diagnosis not present

## 2023-03-14 DIAGNOSIS — E1151 Type 2 diabetes mellitus with diabetic peripheral angiopathy without gangrene: Secondary | ICD-10-CM | POA: Diagnosis not present

## 2023-03-14 DIAGNOSIS — I5040 Unspecified combined systolic (congestive) and diastolic (congestive) heart failure: Secondary | ICD-10-CM | POA: Diagnosis not present

## 2023-03-14 DIAGNOSIS — D649 Anemia, unspecified: Secondary | ICD-10-CM | POA: Diagnosis not present

## 2023-03-14 DIAGNOSIS — J449 Chronic obstructive pulmonary disease, unspecified: Secondary | ICD-10-CM | POA: Diagnosis not present

## 2023-03-14 DIAGNOSIS — M778 Other enthesopathies, not elsewhere classified: Secondary | ICD-10-CM | POA: Diagnosis not present

## 2023-03-14 DIAGNOSIS — I2510 Atherosclerotic heart disease of native coronary artery without angina pectoris: Secondary | ICD-10-CM | POA: Diagnosis not present

## 2023-03-14 DIAGNOSIS — I63511 Cerebral infarction due to unspecified occlusion or stenosis of right middle cerebral artery: Secondary | ICD-10-CM | POA: Diagnosis not present

## 2023-03-14 DIAGNOSIS — N184 Chronic kidney disease, stage 4 (severe): Secondary | ICD-10-CM | POA: Diagnosis not present

## 2023-03-17 ENCOUNTER — Ambulatory Visit (HOSPITAL_COMMUNITY): Payer: Medicare HMO

## 2023-03-19 ENCOUNTER — Telehealth (HOSPITAL_COMMUNITY): Payer: Self-pay

## 2023-03-19 ENCOUNTER — Inpatient Hospital Stay (HOSPITAL_COMMUNITY): Admission: RE | Admit: 2023-03-19 | Payer: Medicare HMO | Source: Ambulatory Visit

## 2023-03-19 ENCOUNTER — Encounter (HOSPITAL_COMMUNITY): Payer: Self-pay

## 2023-03-19 NOTE — Telephone Encounter (Signed)
Attempted to call patient in regards to Cardiac Rehab - LM on VM Mailed letter 

## 2023-03-21 ENCOUNTER — Ambulatory Visit (HOSPITAL_COMMUNITY): Payer: Medicare HMO

## 2023-03-24 ENCOUNTER — Ambulatory Visit (HOSPITAL_COMMUNITY): Payer: Medicare HMO

## 2023-03-26 ENCOUNTER — Ambulatory Visit (HOSPITAL_COMMUNITY): Payer: Medicare HMO

## 2023-03-26 ENCOUNTER — Ambulatory Visit: Payer: Medicare HMO | Admitting: Physician Assistant

## 2023-03-27 ENCOUNTER — Telehealth: Payer: Self-pay | Admitting: Physician Assistant

## 2023-03-27 DIAGNOSIS — I129 Hypertensive chronic kidney disease with stage 1 through stage 4 chronic kidney disease, or unspecified chronic kidney disease: Secondary | ICD-10-CM | POA: Diagnosis not present

## 2023-03-27 NOTE — Telephone Encounter (Signed)
Spoke with the patient's son. The patient has had worsening diarrhea. She normally has diarrhea, but it has increased to at least 7 times a day. Concerns for dehydration. She was scheduled to come in today but he had thought it was a different day. She missed her appointment.  Advised to encourage fluids. Suggested Pedialyte if he is concerned about dehydration. Appointment rescheduled for 03/31/23.

## 2023-03-27 NOTE — Telephone Encounter (Signed)
Patient son Dorene Sorrow called requesting to speak with a nurse in regards his mother medication.He want to know if theres something she can take for constipation?.Please call him back at [phone]

## 2023-03-28 ENCOUNTER — Ambulatory Visit (HOSPITAL_COMMUNITY): Payer: Medicare HMO

## 2023-03-28 NOTE — Progress Notes (Signed)
03/31/2023 [name] [id number] 1946/10/09  Referring provider: Garlan Fillers, MD Primary GI doctor: Dr. Russella Dar  ASSESSMENT AND PLAN:   Diarrhea with fecal incontinence, with extensive workup in the past. Previous right hemicolectomy for ischemic colitis CT abdomen pelvis without contrast 12/2022 unremarkable Benign abdomen on exam today Patient's been off  Questran, does not like powder  - stool samples to rule out infection with ABX use for urinary tract infections-  -Pancreatic elastase repeat to evaluate, only taking Zenpep once daily Can do trial of IBGARD daily, add on fiber FODMAP, trial off lactulose and lifestyle changes discussed Will do trial of colestipol 2 g up to twice daily since patient unable to tolerate powder Given information about Imodium can increase this, no significant abdominal pain no need for Lomotil at this time. If labs are negative for infection can consider repeat metronidazole prescription as patient has some well with this in the past Patient with recent CAD status post DES 0 5/1 cannot get off Plavix, if patient continues to have significant issues can consider repeat fleck sigmoidoscopy to evaluate for microscopic colitis but most likely this is still related to patient's previous hemicolectomy Patient high risk for endoscopic procedures  CAD, multiple vessel with on the table angina with CHF 02/12/2023 patient had unstable angina, status post DES to ostial RCA. Will need to be on Plavix and aspirin for 1 year Follows with Dr. [place]  CKD (chronic kidney disease) stage 4, GFR 15-29 ml/min (HCC) Following with nephrology  Chronic GERD Continue pantoprazole every day  Hx of adenomatous colonic polyps Flex sig 12/2018 - Diffuse mild mucosal changes were found in the rectum and in the sigmoid colon secondary to diversion colitis. Biopsied. - Unable to advance beyond 25 cm due to restricted mobility of the colon. - Internal hemorrhoids.  Path benign Recall 3 years Unable to do at this time with recent DES 02/12/23 Close follow up     Patient Care Team: Garlan Fillers, MD as PCP - General (Internal Medicine) [place], Peter M, MD as PCP - Cardiology (Cardiology) Griselda Miner, MD as Consulting Physician (General Surgery) Marvel Plan, MD as Consulting Physician (Neurology)  HISTORY OF PRESENT ILLNESS: 77 y.o. female with a past medical history of CVA 2023 on Plavix status post carotid stenting, peripheral chill disease, congestive heart failure, type 2 diabetes, CAD multivessel recent cath 02/12/2023 status post DES to ostial RCA, COPD, CKD stage IV, 2018 severe ischemic colitis status post right hemicolectomy with ileostomy and then reversal.  And others listed below presents for evaluation of diarrhea.   EGD 11/01/2021 that showed diffuse mild gastritis with erythema friability in the gastric fundus and body and a few scattered erosions in the cardia consistent with Sheria Lang erosions as well as a medium sized hiatal hernia. Biopsies showed mild chronic gastritis no H. pylori and parietal cell hyperplasia  01/02/2023 CT AB and pelvis without contrast unremarkable Patient follows with Dr. Peter [place] cardiology. 02/12/2023 patient had unstable angina, status post DES to ostial RCA. Will need to be on Plavix and aspirin for close year.  Patient has history of chronic diarrhea post right hemicolectomy, managed previously with Lomotil and Questran. 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 Difficile Xifaxan too expensive, given course of Flagyl for 10 days 12/04/2022 and this has been helping.  Presents today and states that she continues to have diarrhea, not worse than it has been.  States it affects  her life, she can not go anywhere.  She has fecal incontinence and has to wear pads.  She has soft stools to liquids leak out.  Some AB pain but not significant.  She  has had some rectal bleeding, small to moderate amount, thinks she has some rectal irritation and itching.  She has had some issues with feeling she will pass out. No chest pain, no SOB. She states she has been more forgetful.  She had weight loss when she was in the hospital in 04/05 but this has increased and improved.  She has had several ABX for UTI's in the last 6 months   She is not on questran, states she does not like the powder. She is on magnesium.   She  reports that she quit smoking about 12 years ago. Her smoking use included cigarettes. She has a 50.00 pack-year smoking history. She has never used smokeless tobacco. She reports that she does not drink alcohol and does not use drugs.  RELEVANT LABS AND IMAGING: CBC    Component Value Date/Time   WBC 15.4 (H) 02/13/2023 0325   RBC 3.39 (L) 02/13/2023 0325   HGB 10.4 (L) 02/13/2023 0325   HGB 12.1 02/06/2023 1345   HCT 31.5 (L) 02/13/2023 0325   HCT 37.9 02/06/2023 1345   PLT 149 (L) 02/13/2023 0325   PLT 251 02/06/2023 1345   MCV 92.9 02/13/2023 0325   MCV 94 02/06/2023 1345   MCH 30.7 02/13/2023 0325   MCHC 33.0 02/13/2023 0325   RDW 13.6 02/13/2023 0325   RDW 12.8 02/06/2023 1345   LYMPHSABS 1.7 01/04/2023 0257   MONOABS 0.6 01/04/2023 0257   EOSABS 0.2 01/04/2023 0257   BASOSABS 0.1 01/04/2023 0257   Recent Labs    04/09/22 0344 04/15/22 0943 04/15/22 0946 04/19/22 0420 01/02/23 1044 01/04/23 0257 01/23/23 1407 02/06/23 1345 02/12/23 1223 02/13/23 0325  HGB 11.5* 11.4* 12.2 9.3* 11.9* 12.3 12.4 12.1 12.2 10.4*    CMP     Component Value Date/Time   NA 141 02/18/2023 1449   K 5.0 02/18/2023 1449   CL 105 02/18/2023 1449   CO2 21 02/18/2023 1449   GLUCOSE 103 (H) 02/18/2023 1449   GLUCOSE 130 (H) 02/13/2023 0325   BUN 16 02/18/2023 1449   CREATININE 1.85 (H) 02/18/2023 1449   CALCIUM 8.8 02/18/2023 1449   PROT 6.0 (L) 01/02/2023 1044   ALBUMIN 3.2 (L) 01/02/2023 1044   AST 22 01/02/2023 1044    ALT 15 01/02/2023 1044   ALKPHOS 80 01/02/2023 1044   BILITOT 0.5 01/02/2023 1044   GFRNONAA 31 (L) 02/13/2023 0325   GFRAA 30 (L) 10/21/2019 1904      Latest Ref Rng & Units 01/02/2023   10:44 AM 04/15/2022    9:43 AM 04/08/2022    5:09 PM  Hepatic Function  Total Protein 6.5 - 8.1 g/dL 6.0  6.3  6.3   Albumin 3.5 - 5.0 g/dL 3.2  3.2  3.3   AST 15 - 41 U/L 22  29  21    ALT 0 - 44 U/L 15  21  16    Alk Phosphatase 38 - 126 U/L 80  100  87   Total Bilirubin 0.3 - 1.2 mg/dL 0.5  0.8  0.5       Current Medications:   Current Outpatient Medications (Endocrine & Metabolic):    empagliflozin (JARDIANCE) 10 MG TABS tablet, Take 1 tablet (10 mg total) by mouth daily before breakfast.  Current Outpatient Medications (  Cardiovascular):    amLODipine (NORVASC) 10 MG tablet, Take 10 mg by mouth daily.   atorvastatin (LIPITOR) 40 MG tablet, Take 1 tablet (40 mg total) by mouth daily.   cloNIDine (CATAPRES) 0.1 MG tablet, Take 1 tablet (0.1 mg total) by mouth at bedtime.   colestipol (COLESTID) 1 g tablet, Take 2 tablets (2 g total) by mouth 2 (two) times daily.   furosemide (LASIX) 20 MG tablet, Take 1 tablet (20 mg total) by mouth daily as needed.   hydrALAZINE (APRESOLINE) 100 MG tablet, Take 1 tablet (100 mg total) by mouth every 8 (eight) hours.   isosorbide mononitrate (IMDUR) 60 MG 24 hr tablet, Take 1 tablet (60 mg total) by mouth daily.   losartan (COZAAR) 50 MG tablet, TAKE ONE TABLET BY MOUTH EVERY DAY   metoprolol succinate (TOPROL-XL) 100 MG 24 hr tablet, Take 100 mg by mouth daily.   nitroGLYCERIN (NITROSTAT) 0.4 MG SL tablet, Place 1 tablet (0.4 mg total) under the tongue every 5 (five) minutes as needed for chest pain.   triamterene-hydrochlorothiazide (MAXZIDE-25) 37.5-25 MG tablet, Take 0.5 tablets by mouth daily.  Current Outpatient Medications (Respiratory):    albuterol (VENTOLIN HFA) 108 (90 Base) MCG/ACT inhaler, Inhale 1-2 puffs into the lungs every 6 (six) hours as  needed for wheezing or shortness of breath.   fluticasone (FLONASE) 50 MCG/ACT nasal spray, Place 2 sprays into both nostrils daily as needed for allergies.   montelukast (SINGULAIR) 10 MG tablet, Take 10 mg by mouth daily.  Current Outpatient Medications (Analgesics):    acetaminophen (TYLENOL) 325 MG tablet, Take 650 mg by mouth every 6 (six) hours as needed for mild pain.   aspirin 81 MG chewable tablet, Chew 1 tablet (81 mg total) by mouth daily.   HYDROcodone-acetaminophen (NORCO) 10-325 MG tablet, Take 1 tablet by mouth every 6 (six) hours as needed. PRN  Current Outpatient Medications (Hematological):    clopidogrel (PLAVIX) 75 MG tablet, Take 1 tablet (75 mg total) by mouth daily.   Polysaccharide-Iron Complex 150 MG CAPS, Take 1 tablet by mouth daily.   vitamin B-12 (CYANOCOBALAMIN) 500 MCG tablet, Take 500 mcg by mouth daily.  Current Outpatient Medications (Other):    [employer] DIGESTIVE PROBIOTIC 250 MG capsule, Take 500 mg by mouth daily.   esomeprazole (NEXIUM) 40 MG capsule, Take 40 mg by mouth 2 (two) times daily before a meal.   levETIRAcetam (KEPPRA) 500 MG tablet, TAKE 1 TABLET BY MOUTH TWICE A DAY (Patient taking differently: Take 500 mg by mouth 2 (two) times daily.)   magnesium oxide (MAG-OX) 400 MG tablet, Take 1 tablet by mouth daily.   meclizine (ANTIVERT) 25 MG tablet, Take 0.5 tablets (12.5 mg total) by mouth 3 (three) times daily as needed for dizziness.   mirtazapine (REMERON) 15 MG tablet, Take 15 mg by mouth at bedtime.   ondansetron (ZOFRAN) 4 MG tablet, Take 1 tablet (4 mg total) by mouth every 6 (six) hours as needed for nausea or vomiting.   Pancrelipase, Lip-Prot-Amyl, (ZENPEP) 40000-126000 units CPEP, Take 2 capsules with meals and one capsule with snacks. (Patient taking differently: Take 1 capsule by mouth daily with breakfast.)   pantoprazole (PROTONIX) 40 MG tablet, Take 1 tablet (40 mg total) by mouth 2 (two) times daily.   venlafaxine XR (EFFEXOR-XR) 75 MG  24 hr capsule, Take 1 capsule (75 mg total) by mouth daily.   Vitamin D, Ergocalciferol, (DRISDOL) 1.25 MG (50000 UT) CAPS capsule, Take 50,000 Units by mouth every Wednesday.  Medical History:  Past Medical History:  Diagnosis Date   Anxiety    Arthritis    back    Blind loop syndrome    Clotting disorder (HCC)    Hx Clot in leg per pt    Colostomy present [facility])    Coronary artery disease    a. known 60-70% mid-LAD stenosis with caths in 1999, 2002, 2006, and 2012 showing stable anatomy b. 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HCC)    Diabetes mellitus    Diverticulosis    Dizziness    chronic   Dyslipidemia    Fatty liver    GERD (gastroesophageal reflux disease)    hiatial hernia   Heart murmur    Hiatal hernia    Hypertension    Irritable bowel syndrome    Pancreatitis, chronic (HCC)    Peripheral vascular disease (HCC) 02/20/2010   s/p stent of left SFA; carotid stenosis   Seizure (HCC) 11/04/2018   last 1 yr 2020 per pt unsure month    Sleep apnea    no cpap    Stroke [facility])    Thyroid nodule    Vitamin B12 deficiency    Allergies:  Allergies  Allergen Reactions   Adhesive [Tape] Other (See Comments)    "Took off my skin"   Other Rash    "Took off my skin"   Ace Inhibitors Other (See Comments)    Unknown   Sulfa Antibiotics Other (See Comments)    Unknown   Topiramate     unknown   Codeine Other (See Comments)    Altered mental state    Iodinated Contrast Media Rash   Latex Rash     Surgical History:  She  has a past surgical history that includes Breast enhancement surgery; Tonsillectomy; Abdominal hysterectomy; Cardiac catheterization (07/07/98;08/31/01;03/04/05); Femoral artery stent; Patella realignment (Left); Cataract extraction (Bilateral); Endobronchial ultrasound (Bilateral, 02/24/2017); Augmentation mammaplasty; Lung surgery (03/2017); Colon resection (N/A, 10/14/2017); laparotomy (N/A, 10/16/2017); laparotomy (N/A,  10/18/2017); Sigmoidoscopy; Polypectomy; Colonoscopy; Transcarotid artery revascularization (Right, 04/18/2022); LEFT HEART CATH AND CORONARY ANGIOGRAPHY (N/A, 01/29/2023); CORONARY ATHERECTOMY (N/A, 02/12/2023); CORONARY STENT INTERVENTION (N/A, 02/12/2023); and Coronary Ultrasound/IVUS (N/A, 02/12/2023). Family History:  Her family history includes Colon cancer in her paternal grandfather; Hypertension in her father and mother; Osteoarthritis in her mother; Pulmonary embolism in her mother; Stroke in her father and another family member.  REVIEW OF SYSTEMS  : All other systems reviewed and negative except where noted in the History of Present Illness.  PHYSICAL EXAM: BP 96/60 (BP Location: Left Arm, Patient Position: Sitting, Cuff Size: Normal)   Pulse 60   Ht 4' 11.5" (1.511 m)   Wt 167 lb (75.8 kg)   BMI 33.17 kg/m  General Appearance: Obese, in no apparent distress. Head:   Normocephalic and atraumatic. Eyes:  sclerae anicteric,conjunctive pink  Respiratory: Respiratory effort normal, BS equal bilaterally without rales, rhonchi, wheezing. Cardio: RRR with no MRGs. Peripheral pulses intact.  Abdomen: Soft,   obese with upper abdominal midline hernia reducible,active bowel sounds. No tenderness . Without guarding and Without rebound. No masses. Rectal: declines Musculoskeletal: Full ROM, Antalgic gait, able to get on exam table with some assistance. Without edema. Skin:  Dry and intact without significant lesions or rashes Neuro: Alert and  oriented x4;  No focal deficits. Psych:  Cooperative. Normal mood and affect.    Doree Albee, PA-C 12:46 PM

## 2023-03-31 ENCOUNTER — Ambulatory Visit (INDEPENDENT_AMBULATORY_CARE_PROVIDER_SITE_OTHER): Payer: Medicare HMO | Admitting: Physician Assistant

## 2023-03-31 ENCOUNTER — Ambulatory Visit (HOSPITAL_COMMUNITY): Payer: Medicare HMO

## 2023-03-31 ENCOUNTER — Encounter: Payer: Self-pay | Admitting: Physician Assistant

## 2023-03-31 ENCOUNTER — Other Ambulatory Visit (INDEPENDENT_AMBULATORY_CARE_PROVIDER_SITE_OTHER): Payer: Medicare HMO

## 2023-03-31 VITALS — BP 96/60 | HR 60 | Ht 59.5 in | Wt 167.0 lb

## 2023-03-31 DIAGNOSIS — N184 Chronic kidney disease, stage 4 (severe): Secondary | ICD-10-CM | POA: Diagnosis not present

## 2023-03-31 DIAGNOSIS — I5042 Chronic combined systolic (congestive) and diastolic (congestive) heart failure: Secondary | ICD-10-CM

## 2023-03-31 DIAGNOSIS — Z86010 Personal history of colonic polyps: Secondary | ICD-10-CM

## 2023-03-31 DIAGNOSIS — R197 Diarrhea, unspecified: Secondary | ICD-10-CM | POA: Diagnosis not present

## 2023-03-31 DIAGNOSIS — I2510 Atherosclerotic heart disease of native coronary artery without angina pectoris: Secondary | ICD-10-CM | POA: Diagnosis not present

## 2023-03-31 DIAGNOSIS — K219 Gastro-esophageal reflux disease without esophagitis: Secondary | ICD-10-CM

## 2023-03-31 DIAGNOSIS — I200 Unstable angina: Secondary | ICD-10-CM

## 2023-03-31 LAB — CBC WITH DIFFERENTIAL/PLATELET
Basophils Absolute: 0.1 10*3/uL (ref 0.0–0.1)
Basophils Relative: 0.9 % (ref 0.0–3.0)
Eosinophils Absolute: 0.4 10*3/uL (ref 0.0–0.7)
Eosinophils Relative: 3 % (ref 0.0–5.0)
HCT: 37.1 % (ref 36.0–46.0)
Hemoglobin: 11.8 g/dL — ABNORMAL LOW (ref 12.0–15.0)
Lymphocytes Relative: 18.9 % (ref 12.0–46.0)
Lymphs Abs: 2.7 10*3/uL (ref 0.7–4.0)
MCHC: 31.8 g/dL (ref 30.0–36.0)
MCV: 95 fl (ref 78.0–100.0)
Monocytes Absolute: 0.9 10*3/uL (ref 0.1–1.0)
Monocytes Relative: 6.5 % (ref 3.0–12.0)
Neutro Abs: 9.9 10*3/uL — ABNORMAL HIGH (ref 1.4–7.7)
Neutrophils Relative %: 70.7 % (ref 43.0–77.0)
Platelets: 223 10*3/uL (ref 150.0–400.0)
RBC: 3.9 Mil/uL (ref 3.87–5.11)
RDW: 15.1 % (ref 11.5–15.5)
WBC: 14 10*3/uL — ABNORMAL HIGH (ref 4.0–10.5)

## 2023-03-31 LAB — COMPREHENSIVE METABOLIC PANEL
ALT: 13 U/L (ref 0–35)
AST: 18 U/L (ref 0–37)
Albumin: 3.7 g/dL (ref 3.5–5.2)
Alkaline Phosphatase: 102 U/L (ref 39–117)
BUN: 30 mg/dL — ABNORMAL HIGH (ref 6–23)
CO2: 20 mEq/L (ref 19–32)
Calcium: 9.1 mg/dL (ref 8.4–10.5)
Chloride: 102 mEq/L (ref 96–112)
Creatinine, Ser: 2.77 mg/dL — ABNORMAL HIGH (ref 0.40–1.20)
GFR: 16.19 mL/min — ABNORMAL LOW (ref >60.00)
Glucose, Bld: 102 mg/dL — ABNORMAL HIGH (ref 70–99)
Potassium: 4.3 mEq/L (ref 3.5–5.1)
Sodium: 133 mEq/L — ABNORMAL LOW (ref 135–145)
Total Bilirubin: 0.5 mg/dL (ref 0.2–1.2)
Total Protein: 6.9 g/dL (ref 6.0–8.3)

## 2023-03-31 LAB — TSH: TSH: 2.43 u[IU]/mL (ref 0.35–5.50)

## 2023-03-31 LAB — SEDIMENTATION RATE: Sed Rate: 30 mm/hr (ref 0–30)

## 2023-03-31 MED ORDER — COLESTIPOL HCL 1 G PO TABS: 2.0000 g | ORAL_TABLET | Freq: Two times a day (BID) | ORAL | 0 refills | Status: DC

## 2023-03-31 NOTE — Patient Instructions (Addendum)
Your provider has requested that you go to the basement level for lab work before leaving today. Press "B" on the elevator. The lab is located at the first door on the left as you exit the elevator.   Please try to stop the magnesium to see if this helps or follow below  Can do the colestid 2 pills twice a day for diarrhea to help cut back on your bowel movements  - Avoid high fat foods, as they can make diarrhea worse.  - Dairy products (except yogurt) may be difficult to digest when you have diarrhea. I recommend that you temporarily avoid lactose-containing foods.  - Can try loperamide 4 mg initially, then 2 mg after each unformed stool for =2 days, with a maximum of 16 mg/day.   Go to the ER if any severe abdominal pain, fever, or weakness  IF YOU STOOL STUDIES ARE NEGATIVE WILL DO TRIAL OF METRONIDAZOLE FOR DIARRHEA  Ways to prevent diarrhea with magnesium:  1) Don't take all your magnesium at the same time, have 2-3 smaller doses through out the day 2) Try taking your magnesium with high fiber meals.  3) If this does not help, take the magnesium on an empty stomach. Fiber for some people can bind the magnesium too well and prevent absorption in your gut.  4) Lastly try different types of magnesium. Most people are taking magnesium citrate, you can also try dimalate capsules which are slow release. You can also find magnesium lotions/sprays for the skin that bypass the gut. Another one that has good absorption is ReMag (pico-iconic magnesium formula), this has great cellular absorption so less of a laxative effect. You can find these type at health food stores or online.    Due to recent changes in healthcare laws, you may see the results of your imaging and laboratory studies on MyChart before your provider has had a chance to review them.  We understand that in some cases there may be results that are confusing or concerning to you. Not all laboratory results come back in the same time  frame and the provider may be waiting for multiple results in order to interpret others.  Please give [place] 48 hours in order for your provider to thoroughly review all the results before contacting the office for clarification of your results.    I appreciate the  opportunity to care for you  Thank You   [facility]

## 2023-04-01 DIAGNOSIS — R197 Diarrhea, unspecified: Secondary | ICD-10-CM | POA: Diagnosis not present

## 2023-04-01 NOTE — Addendum Note (Signed)
Addended by: Sunday Spillers on: 04/01/2023 04:30 PM   Modules accepted: Orders

## 2023-04-02 ENCOUNTER — Ambulatory Visit (HOSPITAL_COMMUNITY): Payer: Medicare HMO

## 2023-04-03 LAB — STOOL CULTURE

## 2023-04-04 ENCOUNTER — Other Ambulatory Visit: Payer: Self-pay

## 2023-04-04 ENCOUNTER — Ambulatory Visit (HOSPITAL_COMMUNITY): Payer: Medicare HMO

## 2023-04-04 DIAGNOSIS — D638 Anemia in other chronic diseases classified elsewhere: Secondary | ICD-10-CM

## 2023-04-04 LAB — STOOL CULTURE: E coli, Shiga toxin Assay: NEGATIVE

## 2023-04-05 LAB — STOOL CULTURE

## 2023-04-07 ENCOUNTER — Ambulatory Visit (HOSPITAL_COMMUNITY): Payer: Medicare HMO

## 2023-04-07 ENCOUNTER — Other Ambulatory Visit: Payer: Self-pay

## 2023-04-07 DIAGNOSIS — D638 Anemia in other chronic diseases classified elsewhere: Secondary | ICD-10-CM

## 2023-04-09 ENCOUNTER — Ambulatory Visit (HOSPITAL_COMMUNITY): Payer: Medicare HMO

## 2023-04-10 ENCOUNTER — Other Ambulatory Visit (INDEPENDENT_AMBULATORY_CARE_PROVIDER_SITE_OTHER): Payer: Medicare HMO

## 2023-04-10 DIAGNOSIS — D638 Anemia in other chronic diseases classified elsewhere: Secondary | ICD-10-CM

## 2023-04-10 LAB — IBC + FERRITIN
Ferritin: 32.8 ng/mL (ref 10.0–291.0)
Iron: 75 ug/dL (ref 42–145)
Saturation Ratios: 18.3 % — ABNORMAL LOW (ref 20.0–50.0)
TIBC: 408.8 ug/dL (ref 250.0–450.0)
Transferrin: 292 mg/dL (ref 212.0–360.0)

## 2023-04-10 LAB — B12 AND FOLATE PANEL
Folate: 10.2 ng/mL (ref >5.9)
Vitamin B-12: 670 pg/mL (ref 211–911)

## 2023-04-11 ENCOUNTER — Ambulatory Visit (HOSPITAL_COMMUNITY): Payer: Medicare HMO

## 2023-04-14 ENCOUNTER — Ambulatory Visit (HOSPITAL_COMMUNITY): Payer: Medicare HMO

## 2023-04-15 ENCOUNTER — Emergency Department (HOSPITAL_COMMUNITY): Payer: Medicare HMO

## 2023-04-15 ENCOUNTER — Inpatient Hospital Stay (HOSPITAL_COMMUNITY)
Admission: EM | Admit: 2023-04-15 | Discharge: 2023-04-20 | DRG: 871 | Disposition: A | Discharge status: Home Health | Payer: Medicare HMO | Attending: Internal Medicine | Admitting: Internal Medicine

## 2023-04-15 ENCOUNTER — Encounter (HOSPITAL_COMMUNITY): Payer: Self-pay

## 2023-04-15 ENCOUNTER — Other Ambulatory Visit: Payer: Self-pay

## 2023-04-15 DIAGNOSIS — Z95820 Peripheral vascular angioplasty status with implants and grafts: Secondary | ICD-10-CM

## 2023-04-15 DIAGNOSIS — R000 Tachycardia, unspecified: Secondary | ICD-10-CM | POA: Insufficient documentation

## 2023-04-15 DIAGNOSIS — R079 Chest pain, unspecified: Secondary | ICD-10-CM | POA: Diagnosis not present

## 2023-04-15 DIAGNOSIS — G9341 Metabolic encephalopathy: Secondary | ICD-10-CM | POA: Diagnosis present

## 2023-04-15 DIAGNOSIS — Z1152 Encounter for screening for COVID-19: Secondary | ICD-10-CM

## 2023-04-15 DIAGNOSIS — K219 Gastro-esophageal reflux disease without esophagitis: Secondary | ICD-10-CM | POA: Diagnosis present

## 2023-04-15 DIAGNOSIS — R0689 Other abnormalities of breathing: Secondary | ICD-10-CM | POA: Diagnosis not present

## 2023-04-15 DIAGNOSIS — Z91040 Latex allergy status: Secondary | ICD-10-CM

## 2023-04-15 DIAGNOSIS — E785 Hyperlipidemia, unspecified: Secondary | ICD-10-CM | POA: Diagnosis present

## 2023-04-15 DIAGNOSIS — K760 Fatty (change of) liver, not elsewhere classified: Secondary | ICD-10-CM | POA: Diagnosis not present

## 2023-04-15 DIAGNOSIS — M25551 Pain in right hip: Secondary | ICD-10-CM | POA: Diagnosis present

## 2023-04-15 DIAGNOSIS — J189 Pneumonia, unspecified organism: Secondary | ICD-10-CM | POA: Insufficient documentation

## 2023-04-15 DIAGNOSIS — Z8673 Personal history of transient ischemic attack (TIA), and cerebral infarction without residual deficits: Secondary | ICD-10-CM

## 2023-04-15 DIAGNOSIS — E1151 Type 2 diabetes mellitus with diabetic peripheral angiopathy without gangrene: Secondary | ICD-10-CM | POA: Diagnosis present

## 2023-04-15 DIAGNOSIS — N184 Chronic kidney disease, stage 4 (severe): Secondary | ICD-10-CM | POA: Diagnosis not present

## 2023-04-15 DIAGNOSIS — M1611 Unilateral primary osteoarthritis, right hip: Secondary | ICD-10-CM | POA: Diagnosis not present

## 2023-04-15 DIAGNOSIS — R918 Other nonspecific abnormal finding of lung field: Secondary | ICD-10-CM | POA: Diagnosis not present

## 2023-04-15 DIAGNOSIS — R7989 Other specified abnormal findings of blood chemistry: Secondary | ICD-10-CM

## 2023-04-15 DIAGNOSIS — E1122 Type 2 diabetes mellitus with diabetic chronic kidney disease: Secondary | ICD-10-CM | POA: Diagnosis not present

## 2023-04-15 DIAGNOSIS — J690 Pneumonitis due to inhalation of food and vomit: Secondary | ICD-10-CM | POA: Diagnosis not present

## 2023-04-15 DIAGNOSIS — Z800 Family history of malignant neoplasm of digestive organs: Secondary | ICD-10-CM

## 2023-04-15 DIAGNOSIS — R748 Abnormal levels of other serum enzymes: Secondary | ICD-10-CM | POA: Diagnosis not present

## 2023-04-15 DIAGNOSIS — A419 Sepsis, unspecified organism: Principal | ICD-10-CM | POA: Diagnosis present

## 2023-04-15 DIAGNOSIS — R410 Disorientation, unspecified: Secondary | ICD-10-CM | POA: Diagnosis present

## 2023-04-15 DIAGNOSIS — N390 Urinary tract infection, site not specified: Secondary | ICD-10-CM | POA: Diagnosis present

## 2023-04-15 DIAGNOSIS — Z882 Allergy status to sulfonamides status: Secondary | ICD-10-CM

## 2023-04-15 DIAGNOSIS — N179 Acute kidney failure, unspecified: Secondary | ICD-10-CM | POA: Diagnosis not present

## 2023-04-15 DIAGNOSIS — Z86718 Personal history of other venous thrombosis and embolism: Secondary | ICD-10-CM

## 2023-04-15 DIAGNOSIS — S199XXA Unspecified injury of neck, initial encounter: Secondary | ICD-10-CM | POA: Diagnosis not present

## 2023-04-15 DIAGNOSIS — K861 Other chronic pancreatitis: Secondary | ICD-10-CM | POA: Diagnosis present

## 2023-04-15 DIAGNOSIS — Z87891 Personal history of nicotine dependence: Secondary | ICD-10-CM

## 2023-04-15 DIAGNOSIS — R6520 Severe sepsis without septic shock: Secondary | ICD-10-CM | POA: Diagnosis present

## 2023-04-15 DIAGNOSIS — E860 Dehydration: Secondary | ICD-10-CM | POA: Diagnosis present

## 2023-04-15 DIAGNOSIS — I2510 Atherosclerotic heart disease of native coronary artery without angina pectoris: Secondary | ICD-10-CM | POA: Diagnosis present

## 2023-04-15 DIAGNOSIS — Z7982 Long term (current) use of aspirin: Secondary | ICD-10-CM

## 2023-04-15 DIAGNOSIS — I959 Hypotension, unspecified: Secondary | ICD-10-CM | POA: Diagnosis not present

## 2023-04-15 DIAGNOSIS — Z888 Allergy status to other drugs, medicaments and biological substances status: Secondary | ICD-10-CM

## 2023-04-15 DIAGNOSIS — I70202 Unspecified atherosclerosis of native arteries of extremities, left leg: Secondary | ICD-10-CM | POA: Diagnosis present

## 2023-04-15 DIAGNOSIS — E872 Acidosis, unspecified: Secondary | ICD-10-CM | POA: Diagnosis not present

## 2023-04-15 DIAGNOSIS — S0990XA Unspecified injury of head, initial encounter: Secondary | ICD-10-CM | POA: Diagnosis not present

## 2023-04-15 DIAGNOSIS — Z8249 Family history of ischemic heart disease and other diseases of the circulatory system: Secondary | ICD-10-CM

## 2023-04-15 DIAGNOSIS — Z7984 Long term (current) use of oral hypoglycemic drugs: Secondary | ICD-10-CM

## 2023-04-15 DIAGNOSIS — Z79899 Other long term (current) drug therapy: Secondary | ICD-10-CM

## 2023-04-15 DIAGNOSIS — M6282 Rhabdomyolysis: Secondary | ICD-10-CM | POA: Diagnosis not present

## 2023-04-15 DIAGNOSIS — I129 Hypertensive chronic kidney disease with stage 1 through stage 4 chronic kidney disease, or unspecified chronic kidney disease: Secondary | ICD-10-CM | POA: Diagnosis present

## 2023-04-15 DIAGNOSIS — T796XXA Traumatic ischemia of muscle, initial encounter: Secondary | ICD-10-CM | POA: Diagnosis present

## 2023-04-15 DIAGNOSIS — Z7902 Long term (current) use of antithrombotics/antiplatelets: Secondary | ICD-10-CM

## 2023-04-15 DIAGNOSIS — Z91041 Radiographic dye allergy status: Secondary | ICD-10-CM

## 2023-04-15 DIAGNOSIS — Z885 Allergy status to narcotic agent status: Secondary | ICD-10-CM

## 2023-04-15 DIAGNOSIS — R0902 Hypoxemia: Secondary | ICD-10-CM | POA: Diagnosis not present

## 2023-04-15 DIAGNOSIS — I10 Essential (primary) hypertension: Secondary | ICD-10-CM | POA: Diagnosis present

## 2023-04-15 DIAGNOSIS — Z955 Presence of coronary angioplasty implant and graft: Secondary | ICD-10-CM

## 2023-04-15 DIAGNOSIS — S0083XA Contusion of other part of head, initial encounter: Secondary | ICD-10-CM | POA: Diagnosis present

## 2023-04-15 DIAGNOSIS — I700 Atherosclerosis of aorta: Secondary | ICD-10-CM | POA: Diagnosis not present

## 2023-04-15 DIAGNOSIS — R0602 Shortness of breath: Secondary | ICD-10-CM | POA: Diagnosis not present

## 2023-04-15 DIAGNOSIS — R569 Unspecified convulsions: Secondary | ICD-10-CM | POA: Diagnosis not present

## 2023-04-15 DIAGNOSIS — Z823 Family history of stroke: Secondary | ICD-10-CM

## 2023-04-15 LAB — CBC WITH DIFFERENTIAL/PLATELET
Abs Immature Granulocytes: 0.26 10*3/uL — ABNORMAL HIGH (ref 0.00–0.07)
Basophils Absolute: 0.1 10*3/uL (ref 0.0–0.1)
Basophils Relative: 0 %
Eosinophils Absolute: 0 10*3/uL (ref 0.0–0.5)
Eosinophils Relative: 0 %
HCT: 36.5 % (ref 36.0–46.0)
Hemoglobin: 11.7 g/dL — ABNORMAL LOW (ref 12.0–15.0)
Immature Granulocytes: 1 %
Lymphocytes Relative: 2 %
Lymphs Abs: 0.4 10*3/uL — ABNORMAL LOW (ref 0.7–4.0)
MCH: 29.8 pg (ref 26.0–34.0)
MCHC: 32.1 g/dL (ref 30.0–36.0)
MCV: 93.1 fL (ref 80.0–100.0)
Monocytes Absolute: 0.4 10*3/uL (ref 0.1–1.0)
Monocytes Relative: 2 %
Neutro Abs: 18.4 10*3/uL — ABNORMAL HIGH (ref 1.7–7.7)
Neutrophils Relative %: 95 %
Platelets: 205 10*3/uL (ref 150–400)
RBC: 3.92 MIL/uL (ref 3.87–5.11)
RDW: 13.5 % (ref 11.5–15.5)
WBC: 19.5 10*3/uL — ABNORMAL HIGH (ref 4.0–10.5)
nRBC: 0 % (ref 0.0–0.2)

## 2023-04-15 LAB — RESPIRATORY PANEL BY PCR

## 2023-04-15 LAB — CBC
HCT: 31.4 % — ABNORMAL LOW (ref 36.0–46.0)
Hemoglobin: 10.1 g/dL — ABNORMAL LOW (ref 12.0–15.0)
MCH: 30.1 pg (ref 26.0–34.0)
MCHC: 32.2 g/dL (ref 30.0–36.0)
MCV: 93.5 fL (ref 80.0–100.0)
Platelets: 169 10*3/uL (ref 150–400)
RBC: 3.36 MIL/uL — ABNORMAL LOW (ref 3.87–5.11)
RDW: 13.4 % (ref 11.5–15.5)
WBC: 16.8 10*3/uL — ABNORMAL HIGH (ref 4.0–10.5)
nRBC: 0 % (ref 0.0–0.2)

## 2023-04-15 LAB — COMPREHENSIVE METABOLIC PANEL
ALT: 69 U/L — ABNORMAL HIGH (ref 0–44)
AST: 171 U/L — ABNORMAL HIGH (ref 15–41)
Albumin: 3 g/dL — ABNORMAL LOW (ref 3.5–5.0)
Alkaline Phosphatase: 101 U/L (ref 38–126)
Anion gap: 16 — ABNORMAL HIGH (ref 5–15)
BUN: 56 mg/dL — ABNORMAL HIGH (ref 8–23)
CO2: 14 mmol/L — ABNORMAL LOW (ref 22–32)
Calcium: 9.3 mg/dL (ref 8.9–10.3)
Chloride: 105 mmol/L (ref 98–111)
Creatinine, Ser: 4 mg/dL — ABNORMAL HIGH (ref 0.44–1.00)
GFR, Estimated: 11 mL/min — ABNORMAL LOW (ref >60)
Glucose, Bld: 169 mg/dL — ABNORMAL HIGH (ref 70–99)
Potassium: 3.8 mmol/L (ref 3.5–5.1)
Sodium: 135 mmol/L (ref 135–145)
Total Bilirubin: 0.9 mg/dL (ref 0.3–1.2)
Total Protein: 6.4 g/dL — ABNORMAL LOW (ref 6.5–8.1)

## 2023-04-15 LAB — URINALYSIS, ROUTINE W REFLEX MICROSCOPIC
Bilirubin Urine: NEGATIVE
Glucose, UA: NEGATIVE mg/dL
Ketones, ur: NEGATIVE mg/dL
Nitrite: NEGATIVE
Protein, ur: 100 mg/dL — AB
Specific Gravity, Urine: 1.017 (ref 1.005–1.030)
WBC, UA: >50 WBC/hpf (ref 0–5)
pH: 5 (ref 5.0–8.0)

## 2023-04-15 LAB — LACTIC ACID, PLASMA
Lactic Acid, Venous: 2.8 mmol/L (ref 0.5–1.9)
Lactic Acid, Venous: 2.7 mmol/L (ref 0.5–1.9)
Lactic Acid, Venous: 1.8 mmol/L (ref 0.5–1.9)

## 2023-04-15 LAB — TSH: TSH: 1.013 u[IU]/mL (ref 0.350–4.500)

## 2023-04-15 LAB — CREATININE, SERUM
Creatinine, Ser: 3.59 mg/dL — ABNORMAL HIGH (ref 0.44–1.00)
GFR, Estimated: 13 mL/min — ABNORMAL LOW (ref >60)

## 2023-04-15 LAB — SARS CORONAVIRUS 2 BY RT PCR: SARS Coronavirus 2 by RT PCR: NEGATIVE

## 2023-04-15 LAB — HIV ANTIBODY (ROUTINE TESTING W REFLEX): HIV Screen 4th Generation wRfx: NONREACTIVE

## 2023-04-15 LAB — CBG MONITORING, ED
Glucose-Capillary: 150 mg/dL — ABNORMAL HIGH (ref 70–99)
Glucose-Capillary: 108 mg/dL — ABNORMAL HIGH (ref 70–99)

## 2023-04-15 LAB — VITAMIN B12: Vitamin B-12: 674 pg/mL (ref 180–914)

## 2023-04-15 LAB — CK: Total CK: 2965 U/L — ABNORMAL HIGH (ref 38–234)

## 2023-04-15 MED ORDER — SODIUM BICARBONATE 650 MG PO TABS
650.0000 mg | ORAL_TABLET | Freq: Four times a day (QID) | ORAL | Status: DC
  Administered 2023-04-16 – 2023-04-20 (×16): 650 mg via ORAL
  Filled 2023-04-15 (×16): qty 1

## 2023-04-15 MED ORDER — SODIUM CHLORIDE 0.9 % IV SOLN: INTRAVENOUS | Status: AC

## 2023-04-15 MED ORDER — SODIUM CHLORIDE 0.9 % IV SOLN
1.0000 g | Freq: Once | INTRAVENOUS | Status: AC
  Administered 2023-04-15: 1 g via INTRAVENOUS
  Filled 2023-04-15: qty 10

## 2023-04-15 MED ORDER — INSULIN ASPART 100 UNIT/ML IJ SOLN: 0.0000 [IU] | Freq: Every day | INTRAMUSCULAR | Status: DC

## 2023-04-15 MED ORDER — SODIUM CHLORIDE 0.9 % IV BOLUS
1000.0000 mL | Freq: Once | INTRAVENOUS | Status: AC
  Administered 2023-04-15: 1000 mL via INTRAVENOUS

## 2023-04-15 MED ORDER — LABETALOL HCL 5 MG/ML IV SOLN: 20.0000 mg | INTRAVENOUS | Status: DC | PRN

## 2023-04-15 MED ORDER — HYDRALAZINE HCL 20 MG/ML IJ SOLN: 10.0000 mg | INTRAMUSCULAR | Status: DC | PRN

## 2023-04-15 MED ORDER — ASPIRIN 81 MG PO CHEW
81.0000 mg | CHEWABLE_TABLET | Freq: Every day | ORAL | Status: DC
  Administered 2023-04-16 – 2023-04-20 (×5): 81 mg via ORAL
  Filled 2023-04-15 (×5): qty 1

## 2023-04-15 MED ORDER — SENNOSIDES-DOCUSATE SODIUM 8.6-50 MG PO TABS: 1.0000 | ORAL_TABLET | Freq: Every evening | ORAL | Status: DC | PRN

## 2023-04-15 MED ORDER — SODIUM CHLORIDE 0.9 % IV SOLN
1.0000 g | INTRAVENOUS | Status: DC
  Administered 2023-04-16 – 2023-04-19 (×4): 1 g via INTRAVENOUS
  Filled 2023-04-15 (×4): qty 10

## 2023-04-15 MED ORDER — ACETAMINOPHEN 160 MG/5ML PO SOLN: 650.0000 mg | ORAL | Status: DC | PRN

## 2023-04-15 MED ORDER — MONTELUKAST SODIUM 10 MG PO TABS
10.0000 mg | ORAL_TABLET | Freq: Every day | ORAL | Status: DC
  Administered 2023-04-16 – 2023-04-20 (×5): 10 mg via ORAL
  Filled 2023-04-15 (×5): qty 1

## 2023-04-15 MED ORDER — SODIUM CHLORIDE 0.9 % IV SOLN
500.0000 mg | Freq: Once | INTRAVENOUS | Status: AC
  Administered 2023-04-15: 500 mg via INTRAVENOUS
  Filled 2023-04-15: qty 5

## 2023-04-15 MED ORDER — ACETAMINOPHEN 325 MG PO TABS
650.0000 mg | ORAL_TABLET | ORAL | Status: DC | PRN
  Administered 2023-04-18: 650 mg via ORAL
  Filled 2023-04-15: qty 2

## 2023-04-15 MED ORDER — ATORVASTATIN CALCIUM 40 MG PO TABS
40.0000 mg | ORAL_TABLET | Freq: Every day | ORAL | Status: DC
  Administered 2023-04-16 – 2023-04-17 (×2): 40 mg via ORAL
  Filled 2023-04-15 (×2): qty 1

## 2023-04-15 MED ORDER — LORAZEPAM 2 MG/ML IJ SOLN: 1.0000 mg | Freq: Once | INTRAMUSCULAR | Status: DC | PRN

## 2023-04-15 MED ORDER — INSULIN ASPART 100 UNIT/ML IJ SOLN
0.0000 [IU] | Freq: Three times a day (TID) | INTRAMUSCULAR | Status: DC
  Administered 2023-04-19 – 2023-04-20 (×2): 1 [IU] via SUBCUTANEOUS

## 2023-04-15 MED ORDER — SODIUM CHLORIDE 0.9 % IV SOLN
500.0000 mg | INTRAVENOUS | Status: AC
  Administered 2023-04-16 – 2023-04-17 (×2): 500 mg via INTRAVENOUS
  Filled 2023-04-15 (×2): qty 5

## 2023-04-15 MED ORDER — PANCRELIPASE (LIP-PROT-AMYL) 36000-114000 UNITS PO CPEP
36000.0000 [IU] | ORAL_CAPSULE | Freq: Two times a day (BID) | ORAL | Status: DC
  Administered 2023-04-16 – 2023-04-20 (×9): 36000 [IU] via ORAL
  Filled 2023-04-15 (×9): qty 1

## 2023-04-15 MED ORDER — LEVETIRACETAM 500 MG PO TABS
500.0000 mg | ORAL_TABLET | Freq: Two times a day (BID) | ORAL | Status: DC
  Administered 2023-04-16 – 2023-04-20 (×9): 500 mg via ORAL
  Filled 2023-04-15 (×9): qty 1

## 2023-04-15 MED ORDER — LACTATED RINGERS IV SOLN: INTRAVENOUS | Status: DC

## 2023-04-15 MED ORDER — THIAMINE HCL 100 MG/ML IJ SOLN
500.0000 mg | Freq: Every day | INTRAVENOUS | Status: AC
  Administered 2023-04-15 – 2023-04-17 (×3): 500 mg via INTRAVENOUS
  Filled 2023-04-15 (×4): qty 5

## 2023-04-15 MED ORDER — ACETAMINOPHEN 650 MG RE SUPP: 650.0000 mg | RECTAL | Status: DC | PRN

## 2023-04-15 MED ORDER — LACTATED RINGERS IV BOLUS
1000.0000 mL | Freq: Once | INTRAVENOUS | Status: AC
  Administered 2023-04-15: 1000 mL via INTRAVENOUS

## 2023-04-15 MED ORDER — LEVETIRACETAM IN NACL 500 MG/100ML IV SOLN
500.0000 mg | Freq: Once | INTRAVENOUS | Status: AC
  Administered 2023-04-16: 500 mg via INTRAVENOUS
  Filled 2023-04-15: qty 100

## 2023-04-15 MED ORDER — ENOXAPARIN SODIUM 40 MG/0.4ML IJ SOSY
40.0000 mg | PREFILLED_SYRINGE | INTRAMUSCULAR | Status: DC
  Administered 2023-04-16: 40 mg via SUBCUTANEOUS
  Filled 2023-04-15: qty 0.4

## 2023-04-15 MED ORDER — ISOSORBIDE MONONITRATE ER 60 MG PO TB24
60.0000 mg | ORAL_TABLET | Freq: Every day | ORAL | Status: DC
  Administered 2023-04-16 – 2023-04-19 (×4): 60 mg via ORAL
  Filled 2023-04-15 (×4): qty 1

## 2023-04-15 MED ORDER — STROKE: EARLY STAGES OF RECOVERY BOOK
Freq: Once | Status: AC
  Filled 2023-04-15: qty 1

## 2023-04-15 NOTE — ED Provider Notes (Signed)
[place] EMERGENCY DEPARTMENT AT [facility] Provider Note   CSN: [id number] Arrival date & time: 04/15/23  1717     History  Chief Complaint  Patient presents with   Altered Mental Status    [name] is a 77 y.o. female.  Patient has a medical history of carotid artery disease, CAD, DM2, HTN, CVA, PAD with stent to left SFA, brought to ED by EMS after found on floor by family covered in urine and confused. Patient was last seen by family three days ago. Patient reports fever, chills, right hip pain. Denies recent sick contacts, dysuria, SOB. Oriented only to person and place.      Home Medications Prior to Admission medications   Medication Sig Start Date End Date Taking? Authorizing Provider  acetaminophen (TYLENOL) 325 MG tablet Take 650 mg by mouth every 6 (six) hours as needed for mild pain.    [provider]  albuterol (VENTOLIN HFA) 108 (90 Base) MCG/ACT inhaler Inhale 1-2 puffs into the lungs every 6 (six) hours as needed for wheezing or shortness of breath. 03/30/21   [provider]  amLODipine (NORVASC) 10 MG tablet Take 10 mg by mouth daily.    [provider]  aspirin 81 MG chewable tablet Chew 1 tablet (81 mg total) by mouth daily. 04/19/22   Tyrone Nine, MD  atorvastatin (LIPITOR) 40 MG tablet Take 1 tablet (40 mg total) by mouth daily. 04/19/22   Tyrone Nine, MD  cloNIDine (CATAPRES) 0.1 MG tablet Take 1 tablet (0.1 mg total) by mouth at bedtime. 02/18/23   [place], Peter M, MD  clopidogrel (PLAVIX) 75 MG tablet Take 1 tablet (75 mg total) by mouth daily. 04/19/22   Tyrone Nine, MD  colestipol (COLESTID) 1 g tablet Take 2 tablets (2 g total) by mouth 2 (two) times daily. 03/31/23   Doree Albee, PA-C  [employer] DIGESTIVE PROBIOTIC 250 MG capsule Take 500 mg by mouth daily. 06/27/21   [provider]  empagliflozin (JARDIANCE) 10 MG TABS tablet Take 1 tablet (10 mg total) by mouth daily before breakfast. 02/13/23   Perlie Gold, PA-C  esomeprazole (NEXIUM) 40 MG capsule Take 40 mg by mouth 2 (two) times daily before a meal. 01/11/20   [provider]  fluticasone (FLONASE) 50 MCG/ACT nasal spray Place 2 sprays into both nostrils daily as needed for allergies. 01/29/16   [provider]  furosemide (LASIX) 20 MG tablet Take 1 tablet (20 mg total) by mouth daily as needed. 02/13/23   Perlie Gold, PA-C  hydrALAZINE (APRESOLINE) 100 MG tablet Take 1 tablet (100 mg total) by mouth every 8 (eight) hours. 01/04/23   Leroy Sea, MD  HYDROcodone-acetaminophen (NORCO) 10-325 MG tablet Take 1 tablet by mouth every 6 (six) hours as needed. PRN 02/12/23   [provider]  isosorbide mononitrate (IMDUR) 60 MG 24 hr tablet Take 1 tablet (60 mg total) by mouth daily. 01/29/23   [place], Peter M, MD  levETIRAcetam (KEPPRA) 500 MG tablet TAKE 1 TABLET BY MOUTH TWICE A DAY Patient taking differently: Take 500 mg by mouth 2 (two) times daily. 01/20/20   Dohmeier, Porfirio Mylar, MD  losartan (COZAAR) 50 MG tablet TAKE ONE TABLET BY MOUTH EVERY DAY 01/22/23   [place], Peter M, MD  magnesium oxide (MAG-OX) 400 MG tablet Take 1 tablet by mouth daily. 05/08/20   [provider]  meclizine (ANTIVERT) 25 MG tablet Take 0.5 tablets (12.5 mg total) by mouth  3 (three) times daily as needed for dizziness. 01/04/23   Leroy Sea, MD  metoprolol succinate (TOPROL-XL) 100 MG 24 hr tablet Take 100 mg by mouth daily. 12/20/21   [provider]  mirtazapine (REMERON) 15 MG tablet Take 15 mg by mouth at bedtime. 07/18/21   [provider]  montelukast (SINGULAIR) 10 MG tablet Take 10 mg by mouth daily. 08/09/18   [provider]  nitroGLYCERIN (NITROSTAT) 0.4 MG SL tablet Place 1 tablet (0.4 mg total) under the tongue every 5 (five) minutes as needed for chest pain. 01/23/23   [place], Peter M, MD  ondansetron (ZOFRAN) 4 MG tablet Take 1 tablet (4 mg total) by mouth every 6 (six) hours as needed for  nausea or vomiting. 10/24/21   Esterwood, Amy S, PA-C  Pancrelipase, Lip-Prot-Amyl, (ZENPEP) 40000-126000 units CPEP Take 2 capsules with meals and one capsule with snacks. Patient taking differently: Take 1 capsule by mouth daily with breakfast. 06/08/21   Meryl Dare, MD  pantoprazole (PROTONIX) 40 MG tablet Take 1 tablet (40 mg total) by mouth 2 (two) times daily. 01/31/22   Meryl Dare, MD  Polysaccharide-Iron Complex 150 MG CAPS Take 1 tablet by mouth daily.    [provider]  triamterene-hydrochlorothiazide (MAXZIDE-25) 37.5-25 MG tablet Take 0.5 tablets by mouth daily. 03/23/23   [provider]  venlafaxine XR (EFFEXOR-XR) 75 MG 24 hr capsule Take 1 capsule (75 mg total) by mouth daily. 05/18/20   [provider]  vitamin B-12 (CYANOCOBALAMIN) 500 MCG tablet Take 500 mcg by mouth daily.    [provider]  Vitamin D, Ergocalciferol, (DRISDOL) 1.25 MG (50000 UT) CAPS capsule Take 50,000 Units by mouth every Wednesday.     [provider]      Allergies    Adhesive [tape], Other, Ace inhibitors, Sulfa antibiotics, Topiramate, Codeine, Iodinated contrast media, and Latex    Review of Systems   Review of Systems  Constitutional:  Positive for chills and fever.  Cardiovascular:  Negative for chest pain.  Gastrointestinal:  Positive for abdominal pain.  Genitourinary:  Negative for dysuria.    Physical Exam Updated Vital Signs BP 133/82   Pulse (!) 125   Temp 97.8 F (36.6 C)   Resp 14   Ht 5\' 3"  (1.6 m)   Wt 68 kg   SpO2 98%   BMI 26.57 kg/m  Physical Exam Constitutional:      General: She is not in acute distress. Cardiovascular:     Rate and Rhythm: Tachycardia present.  Pulmonary:     Effort: Pulmonary effort is normal.  Abdominal:     General: There is no distension.     Palpations: Abdomen is soft.     Tenderness: There is no abdominal tenderness.  Skin:    General: Skin is warm and dry.     Findings: Bruising  present.     Comments: Bruise to upper left forehead  Neurological:     Mental Status: She is alert. She is disoriented.     ED Results / Procedures / Treatments   Labs (all labs ordered are listed, but only abnormal results are displayed) Labs Reviewed  CBC WITH DIFFERENTIAL/PLATELET - Abnormal; Notable for the following components:      Result Value   WBC 19.5 (*)    Hemoglobin 11.7 (*)    Neutro Abs 18.4 (*)    Lymphs Abs 0.4 (*)    Abs Immature Granulocytes 0.26 (*)    All  other components within normal limits  COMPREHENSIVE METABOLIC PANEL - Abnormal; Notable for the following components:   CO2 14 (*)    Glucose, Bld 169 (*)    BUN 56 (*)    Creatinine, Ser 4.00 (*)    Total Protein 6.4 (*)    Albumin 3.0 (*)    AST 171 (*)    ALT 69 (*)    GFR, Estimated 11 (*)    Anion gap 16 (*)    All other components within normal limits  CK - Abnormal; Notable for the following components:   Total CK 2,965 (*)    All other components within normal limits  LACTIC ACID, PLASMA - Abnormal; Notable for the following components:   Lactic Acid, Venous 2.8 (*)    All other components within normal limits  CBG MONITORING, ED - Abnormal; Notable for the following components:   Glucose-Capillary 150 (*)    All other components within normal limits  CULTURE, BLOOD (SINGLE)  CULTURE, BLOOD (ROUTINE X 2)  CULTURE, BLOOD (ROUTINE X 2)  URINALYSIS, ROUTINE W REFLEX MICROSCOPIC  LACTIC ACID, PLASMA    EKG None  Radiology CT Head Wo Contrast  Result Date: 04/15/2023 CLINICAL DATA:  Head and neck trauma EXAM: CT HEAD WITHOUT CONTRAST CT CERVICAL SPINE WITHOUT CONTRAST TECHNIQUE: Multidetector CT imaging of the head and cervical spine was performed following the standard protocol without intravenous contrast. Multiplanar CT image reconstructions of the cervical spine were also generated. RADIATION DOSE REDUCTION: This exam was performed according to the departmental dose-optimization  program which includes automated exposure control, adjustment of the mA and/or kV according to patient size and/or use of iterative reconstruction technique. COMPARISON:  01/02/2023 CT head, 04/03/2022 CTA neck, no prior CT cervical spine available FINDINGS: CT HEAD FINDINGS Evaluation is somewhat limited by motion artifact. Brain: No evidence of acute infarct, hemorrhage, mass, mass effect, or midline shift. No hydrocephalus or extra-axial fluid collection. Periventricular white matter changes, likely the sequela of chronic small vessel ischemic disease. Vascular: No hyperdense vessel. Atherosclerotic calcifications in the intracranial carotid and vertebral arteries. Skull: Negative for fracture or focal lesion. Sinuses/Orbits: No acute finding. CT CERVICAL SPINE FINDINGS Alignment: No traumatic listhesis. Skull base and vertebrae: No acute fracture or suspicious osseous lesion. Soft tissues and spinal canal: No prevertebral fluid or swelling. No visible canal hematoma. Disc levels: Degenerative changes in the cervical spine. Moderate spinal canal stenosis at C5-C6 and mild spinal canal stenosis at C6-C7. Upper chest: Ground-glass opacities in the imaged right lung (series 5, image 76). No pleural effusion. Aortic atherosclerosis. IMPRESSION: 1. No acute intracranial process. 2. No acute fracture or traumatic listhesis in the cervical spine. 3. Ground-glass opacities in the imaged right lung, which could be infectious or inflammatory. 4. Aortic atherosclerosis. Aortic Atherosclerosis (ICD10-I70.0). Electronically Signed   By: Wiliam Ke M.D.   On: 04/15/2023 19:59   CT Cervical Spine Wo Contrast  Result Date: 04/15/2023 CLINICAL DATA:  Head and neck trauma EXAM: CT HEAD WITHOUT CONTRAST CT CERVICAL SPINE WITHOUT CONTRAST TECHNIQUE: Multidetector CT imaging of the head and cervical spine was performed following the standard protocol without intravenous contrast. Multiplanar CT image reconstructions of the  cervical spine were also generated. RADIATION DOSE REDUCTION: This exam was performed according to the departmental dose-optimization program which includes automated exposure control, adjustment of the mA and/or kV according to patient size and/or use of iterative reconstruction technique. COMPARISON:  01/02/2023 CT head, 04/03/2022 CTA neck, no prior CT cervical spine available FINDINGS:  CT HEAD FINDINGS Evaluation is somewhat limited by motion artifact. Brain: No evidence of acute infarct, hemorrhage, mass, mass effect, or midline shift. No hydrocephalus or extra-axial fluid collection. Periventricular white matter changes, likely the sequela of chronic small vessel ischemic disease. Vascular: No hyperdense vessel. Atherosclerotic calcifications in the intracranial carotid and vertebral arteries. Skull: Negative for fracture or focal lesion. Sinuses/Orbits: No acute finding. CT CERVICAL SPINE FINDINGS Alignment: No traumatic listhesis. Skull base and vertebrae: No acute fracture or suspicious osseous lesion. Soft tissues and spinal canal: No prevertebral fluid or swelling. No visible canal hematoma. Disc levels: Degenerative changes in the cervical spine. Moderate spinal canal stenosis at C5-C6 and mild spinal canal stenosis at C6-C7. Upper chest: Ground-glass opacities in the imaged right lung (series 5, image 76). No pleural effusion. Aortic atherosclerosis. IMPRESSION: 1. No acute intracranial process. 2. No acute fracture or traumatic listhesis in the cervical spine. 3. Ground-glass opacities in the imaged right lung, which could be infectious or inflammatory. 4. Aortic atherosclerosis. Aortic Atherosclerosis (ICD10-I70.0). Electronically Signed   By: Wiliam Ke M.D.   On: 04/15/2023 19:59   US Abdomen Limited RUQ (LIVER/GB)  Result Date: 04/15/2023 CLINICAL DATA:  Elevated liver enzyme EXAM: ULTRASOUND ABDOMEN LIMITED RIGHT UPPER QUADRANT COMPARISON:  09/12/2016 right upper quadrant ultrasound  FINDINGS: Gallbladder: No gallstones or wall thickening visualized. No sonographic Murphy sign noted by sonographer. Common bile duct: Diameter: 2 mm, within normal limits. No intrahepatic biliary ductal dilatation Liver: No focal lesion identified. Increased parenchymal echogenicity. Portal vein is patent on color Doppler imaging with normal direction of blood flow towards the liver. Other: No left lateral decubitus images could be obtained, as the patient asked to terminate the exam. IMPRESSION: Hepatic steatosis. Electronically Signed   By: Wiliam Ke M.D.   On: 04/15/2023 19:48   DG Hip Unilat W or Wo Pelvis 2-3 Views Right  Result Date: 04/15/2023 CLINICAL DATA:  Recent fall with right hip pain, initial EXAM: DG HIP (WITH OR WITHOUT PELVIS) 3V RIGHT COMPARISON:  None Available. FINDINGS: No acute fracture or dislocation is noted. Vascular calcifications are seen. Mild degenerative changes of the hip joints are noted. No soft tissue abnormality is seen. IMPRESSION: No acute abnormality noted. Electronically Signed   By: Alcide Clever M.D.   On: 04/15/2023 19:18   DG Chest Portable 1 View  Result Date: 04/15/2023 CLINICAL DATA:  Pain after fall EXAM: PORTABLE CHEST 1 VIEW COMPARISON:  Chest x-ray 01/02/2023 FINDINGS: No pneumothorax, effusion or edema. Normal cardiopericardial silhouette. Calcified aorta. Slight ill-defined hazy opacity at the right lung base. A subtle infiltrates possible. Recommend follow-up. Overlapping cardiac leads. IMPRESSION: Slight ill-defined hazy opacity in the right lung base, possible subtle infiltrate. Recommend follow-up Electronically Signed   By: Karen Kays M.D.   On: 04/15/2023 18:08    Procedures Procedures    Medications Ordered in ED Medications  cefTRIAXone (ROCEPHIN) 1 g in sodium chloride 0.9 % 100 mL IVPB (1 g Intravenous New Bag/Given 04/15/23 2019)  azithromycin (ZITHROMAX) 500 mg in sodium chloride 0.9 % 250 mL IVPB (500 mg Intravenous New Bag/Given  04/15/23 2035)  sodium chloride 0.9 % bolus 1,000 mL (1,000 mLs Intravenous New Bag/Given 04/15/23 2034)    ED Course/ Medical Decision Making/ A&P                             Medical Decision Making Patient was seen in the ED after an unwitnessed fall and laying  on the ground for an unknown amount of time. Upon arrival to the ED, patient was confused and disoriented, was tachycardic to 125, normotensive and afebrile. Patient reported subjective fever and abdominal pain. Labs showed WBC of 19, CK of 2900, and Creatinine of 4.0, and elevated LFTs. CT head and neck showed no fracture, though CT and CXR both showed opacities in right lung base. Fluids were started for dehydration, borderline rhabdomyolysis and AKI on CKD. Antibiotics were ordered for treatment of suspected pneumonia and sepsis, and patient was admitted for continued treatment.  Amount and/or Complexity of Data Reviewed External Data Reviewed: labs and radiology. Labs: ordered. Radiology: ordered.          Final Clinical Impression(s) / ED Diagnoses Final diagnoses:  Community acquired pneumonia, unspecified laterality  AKI (acute kidney injury) (HCC)  Non-traumatic rhabdomyolysis  LFT elevation  Dehydration    Rx / DC Orders ED Discharge Orders     None         Monna Fam, MD 04/15/23 2103    Tegeler, Canary Brim, MD 04/16/23 [phone]

## 2023-04-15 NOTE — Assessment & Plan Note (Addendum)
Patient has rather marked amnesia.   She does not even seem to recall that she was on the floor for prolonged period of time.  Therefore at this time I have started the patient on empiric thiamine, I am doing swallow screen and then starting the diet thereafter accordingly.  We will give the patient empiric thiamine, check B12 TSH RPR.   Patient on keppra at home. Check EEG.  Patient has a recent MRI brain in the chart from March 2024.  However if patient's symptoms do not resolve I have a low threshold for repeating the MRI.  At this time we will monitor clinically

## 2023-04-15 NOTE — Assessment & Plan Note (Signed)
Not associated with hypoxia per se, however patient does have marked tachycardia, leukocytosis, and low bicarbonate.  We will trend the lactic acid which is elevated.  Patient does meet sepsis criteria.  Antibiotics ceftriaxone and azithromycin ordered.  I will check COVID

## 2023-04-15 NOTE — Assessment & Plan Note (Signed)
Trend LFTs, check acute hepatitis panel

## 2023-04-15 NOTE — ED Notes (Signed)
Patient transported to CT 

## 2023-04-15 NOTE — ED Notes (Signed)
ED TO INPATIENT HANDOFF REPORT  ED Nurse Name and Phone #: [phone]  S Name/Age/Gender [name] 77 y.o. female Room/Bed: 043C/043C  Code Status   Code Status: Full Code  Home/SNF/Other Home Patient oriented to: self Is this baseline? No   Triage Complete: Triage complete  Chief Complaint Confusion [R41.0]  Triage Note Pt BIB EMS for AMS. LSN Saturday. Family found pt on floor today covered in urine and confused. Pt usually axox4. Pt axox2 on arrival.    Allergies Allergies  Allergen Reactions   Adhesive [Tape] Other (See Comments)    "Took off my skin"   Other Rash    "Took off my skin"   Ace Inhibitors Other (See Comments)    Unknown   Sulfa Antibiotics Other (See Comments)    Unknown   Topiramate     unknown   Codeine Other (See Comments)    Altered mental state    Iodinated Contrast Media Rash   Latex Rash    Level of Care/Admitting Diagnosis ED Disposition     ED Disposition  Admit   Condition  --   Comment  Hospital Area: [facility] [[id number]]  Level of Care: Telemetry Medical [104]  May admit patient to [facility] or [facility] if equivalent level of care is available:: No  Covid Evaluation: Asymptomatic - no recent exposure (last 10 days) testing not required  Diagnosis: Confusion [193061]  Admitting Physician: Nolberto Hanlon [[id number]]  Attending Physician: Nolberto Hanlon [[id number]]  Certification:: I certify this patient will need inpatient services for at least 2 midnights  Estimated Length of Stay: 3          B Medical/Surgery History Past Medical History:  Diagnosis Date   Anxiety    Arthritis    back    Blind loop syndrome    Clotting disorder (HCC)    Hx Clot in leg per pt    Colostomy present (HCC)    Coronary artery disease    a. known 60-70% mid-LAD stenosis with caths in 1999, 2002, 2006, and 2012 showing stable anatomy b. 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HCC)    Diabetes  mellitus    Diverticulosis    Dizziness    chronic   Dyslipidemia    Fatty liver    GERD (gastroesophageal reflux disease)    hiatial hernia   Heart murmur    Hiatal hernia    Hypertension    Irritable bowel syndrome    Pancreatitis, chronic (HCC)    Peripheral vascular disease (HCC) 02/20/2010   s/p stent of left SFA; carotid stenosis   Seizure (HCC) 11/04/2018   last 1 yr 2020 per pt unsure month    Sleep apnea    no cpap    Stroke (HCC)    Thyroid nodule    Vitamin B12 deficiency    Past Surgical History:  Procedure Laterality Date   ABDOMINAL HYSTERECTOMY     partial   AUGMENTATION MAMMAPLASTY     bilateral 1976 retro    BREAST ENHANCEMENT SURGERY     CARDIAC CATHETERIZATION  07/07/98;08/31/01;03/04/05   60 -70% LAD   CATARACT EXTRACTION Bilateral    COLON RESECTION N/A 10/14/2017   Procedure: EXPLORATORY LAPAROTOMY, EXTENDED RIGHT COLECTOMY; APPLICATION OF ABDOMINAL VACUUM DRESSING;  Surgeon: Griselda Miner, MD;  Location: [facility];  Service: General;  Laterality: N/A;   COLONOSCOPY     CORONARY ATHERECTOMY N/A 02/12/2023   Procedure: CORONARY ATHERECTOMY;  Surgeon: Swaziland, Peter M, MD;  Location: [facility] INVASIVE CV LAB;  Service: Cardiovascular;  Laterality: N/A;   CORONARY STENT INTERVENTION N/A 02/12/2023   Procedure: CORONARY STENT INTERVENTION;  Surgeon: Swaziland, Peter M, MD;  Location: [facility] INVASIVE CV LAB;  Service: Cardiovascular;  Laterality: N/A;   CORONARY ULTRASOUND/IVUS N/A 02/12/2023   Procedure: Coronary Ultrasound/IVUS;  Surgeon: Swaziland, Peter M, MD;  Location: [facility] INVASIVE CV LAB;  Service: Cardiovascular;  Laterality: N/A;   ENDOBRONCHIAL ULTRASOUND Bilateral 02/24/2017   Procedure: ENDOBRONCHIAL ULTRASOUND;  Surgeon: Leslye Peer, MD;  Location: [facility];  Service: Cardiopulmonary;  Laterality: Bilateral;   FEMORAL ARTERY STENT     Left leg   LAPAROTOMY N/A 10/16/2017   Procedure: EXPLORATORY LAPAROTOMY, PARTIAL OMENTECTOMY, RESECTION ISCHEMIC ILEUM 130CM,  APPLICATION OF VAC ABDOMINAL DRESSING;  Surgeon: Darnell Level, MD;  Location: [facility];  Service: General;  Laterality: N/A;   LAPAROTOMY N/A 10/18/2017   Procedure: RE-EXPLORATION OF ABDOMEN, ILEOSTOMY CREATION;  Surgeon: Almond Lint, MD;  Location: [facility];  Service: General;  Laterality: N/A;   LEFT HEART CATH AND CORONARY ANGIOGRAPHY N/A 01/29/2023   Procedure: LEFT HEART CATH AND CORONARY ANGIOGRAPHY;  Surgeon: Swaziland, Peter M, MD;  Location: [facility] INVASIVE CV LAB;  Service: Cardiovascular;  Laterality: N/A;   LUNG SURGERY  03/2017   Benign polyps removed   PATELLA REALIGNMENT Left    POLYPECTOMY     SIGMOIDOSCOPY     TONSILLECTOMY     TRANSCAROTID ARTERY REVASCULARIZATION  Right 04/18/2022   Procedure: Transcarotid Artery Revascularization;  Surgeon: Victorino Sparrow, MD;  Location: [facility] OR;  Service: Vascular;  Laterality: Right;     A IV Location/Drains/Wounds Patient Lines/Drains/Airways Status     Active Line/Drains/Airways     Name Placement date Placement time Site Days   Peripheral IV 04/15/23 20 G Anterior;Right Forearm 04/15/23  1724  Forearm  less than 1   Peripheral IV 04/15/23 18 G Left Antecubital 04/15/23  1732  Antecubital  less than 1            Intake/Output Last 24 hours No intake or output data in the 24 hours ending 04/15/23 2209  Labs/Imaging Results for orders placed or performed during the hospital encounter of 04/15/23 (from the past 48 hour(s))  CBG monitoring, ED     Status: Abnormal   Collection Time: 04/15/23  5:29 PM  Result Value Ref Range   Glucose-Capillary 150 (H) 70 - 99 mg/dL    Comment: Glucose reference range applies only to samples taken after fasting for at least 8 hours.  CBC with Differential     Status: Abnormal   Collection Time: 04/15/23  5:30 PM  Result Value Ref Range   WBC 19.5 (H) 4.0 - 10.5 K/uL   RBC 3.92 3.87 - 5.11 MIL/uL   Hemoglobin 11.7 (L) 12.0 - 15.0 g/dL   HCT 40.9 81.1 - 91.4 %   MCV 93.1 80.0 - 100.0 fL   MCH  29.8 26.0 - 34.0 pg   MCHC 32.1 30.0 - 36.0 g/dL   RDW 78.2 95.6 - 21.3 %   Platelets 205 150 - 400 K/uL   nRBC 0.0 0.0 - 0.2 %   Neutrophils Relative % 95 %   Neutro Abs 18.4 (H) 1.7 - 7.7 K/uL   Lymphocytes Relative 2 %   Lymphs Abs 0.4 (L) 0.7 - 4.0 K/uL   Monocytes Relative 2 %   Monocytes Absolute 0.4 0.1 - 1.0 K/uL   Eosinophils Relative 0 %  Eosinophils Absolute 0.0 0.0 - 0.5 K/uL   Basophils Relative 0 %   Basophils Absolute 0.1 0.0 - 0.1 K/uL   Immature Granulocytes 1 %   Abs Immature Granulocytes 0.26 (H) 0.00 - 0.07 K/uL    Comment: Performed at [facility], [address], [place], [place] [address]  Comprehensive metabolic panel     Status: Abnormal   Collection Time: 04/15/23  5:30 PM  Result Value Ref Range   Sodium 135 135 - 145 mmol/L   Potassium 3.8 3.5 - 5.1 mmol/L   Chloride 105 98 - 111 mmol/L   CO2 14 (L) 22 - 32 mmol/L   Glucose, Bld 169 (H) 70 - 99 mg/dL    Comment: Glucose reference range applies only to samples taken after fasting for at least 8 hours.   BUN 56 (H) 8 - 23 mg/dL   Creatinine, Ser 6.04 (H) 0.44 - 1.00 mg/dL    Comment: DELTA CHECK NOTED   Calcium 9.3 8.9 - 10.3 mg/dL   Total Protein 6.4 (L) 6.5 - 8.1 g/dL   Albumin 3.0 (L) 3.5 - 5.0 g/dL   AST 540 (H) 15 - 41 U/L   ALT 69 (H) 0 - 44 U/L   Alkaline Phosphatase 101 38 - 126 U/L   Total Bilirubin 0.9 0.3 - 1.2 mg/dL   GFR, Estimated 11 (L) >60 mL/min    Comment: (NOTE) Calculated using the CKD-EPI Creatinine Equation (2021)    Anion gap 16 (H) 5 - 15    Comment: ELECTROLYTES REPEATED TO VERIFY Performed at [facility], [address], [place], [place] [address]   CK     Status: Abnormal   Collection Time: 04/15/23  5:30 PM  Result Value Ref Range   Total CK 2,965 (H) 38 - 234 U/L    Comment: Performed at [facility], [address] [place], [place] [address]  Lactic acid, plasma     Status: Abnormal   Collection Time: 04/15/23  6:08 PM  Result Value Ref  Range   Lactic Acid, Venous 2.8 (HH) 0.5 - 1.9 mmol/L    Comment: CRITICAL RESULT CALLED TO, READ BACK BY AND VERIFIED WITH [facility] RN 04/15/2023 1927 BNUNNERY Performed at [facility], [address], [place], [place] [address]   Lactic acid, plasma     Status: Abnormal   Collection Time: 04/15/23  8:00 PM  Result Value Ref Range   Lactic Acid, Venous 2.7 (HH) 0.5 - 1.9 mmol/L    Comment: CRITICAL VALUE NOTED. VALUE IS CONSISTENT WITH PREVIOUSLY REPORTED/CALLED VALUE Performed at [facility], [address] [place], [place] [address]   Urinalysis, Routine w reflex microscopic -Urine, Clean Catch     Status: Abnormal   Collection Time: 04/15/23  8:10 PM  Result Value Ref Range   Color, Urine AMBER (A) YELLOW    Comment: BIOCHEMICALS MAY BE AFFECTED BY COLOR   APPearance CLOUDY (A) CLEAR   Specific Gravity, Urine 1.017 1.005 - 1.030   pH 5.0 5.0 - 8.0   Glucose, UA NEGATIVE NEGATIVE mg/dL   Hgb urine dipstick LARGE (A) NEGATIVE   Bilirubin Urine NEGATIVE NEGATIVE   Ketones, ur NEGATIVE NEGATIVE mg/dL   Protein, ur 784 (A) NEGATIVE mg/dL   Nitrite NEGATIVE NEGATIVE   Leukocytes,Ua LARGE (A) NEGATIVE   RBC / HPF 0-5 0 - 5 RBC/hpf   WBC, UA >50 0 - 5 WBC/hpf   Bacteria, UA MANY (A)  NONE SEEN   Squamous Epithelial / HPF 0-5 0 - 5 /HPF   WBC Clumps PRESENT    Mucus PRESENT    Non Squamous Epithelial 0-5 (A) NONE SEEN    Comment: Performed at [facility] Lab, [address], [place], [place] [address]  CBG monitoring, ED     Status: Abnormal   Collection Time: 04/15/23  9:50 PM  Result Value Ref Range   Glucose-Capillary 108 (H) 70 - 99 mg/dL    Comment: Glucose reference range applies only to samples taken after fasting for at least 8 hours.   CT Head Wo Contrast  Result Date: 04/15/2023 CLINICAL DATA:  Head and neck trauma EXAM: CT HEAD WITHOUT CONTRAST CT CERVICAL SPINE WITHOUT CONTRAST TECHNIQUE: Multidetector CT imaging of the head and cervical spine was  performed following the standard protocol without intravenous contrast. Multiplanar CT image reconstructions of the cervical spine were also generated. RADIATION DOSE REDUCTION: This exam was performed according to the departmental dose-optimization program which includes automated exposure control, adjustment of the mA and/or kV according to patient size and/or use of iterative reconstruction technique. COMPARISON:  01/02/2023 CT head, 04/03/2022 CTA neck, no prior CT cervical spine available FINDINGS: CT HEAD FINDINGS Evaluation is somewhat limited by motion artifact. Brain: No evidence of acute infarct, hemorrhage, mass, mass effect, or midline shift. No hydrocephalus or extra-axial fluid collection. Periventricular white matter changes, likely the sequela of chronic small vessel ischemic disease. Vascular: No hyperdense vessel. Atherosclerotic calcifications in the intracranial carotid and vertebral arteries. Skull: Negative for fracture or focal lesion. Sinuses/Orbits: No acute finding. CT CERVICAL SPINE FINDINGS Alignment: No traumatic listhesis. Skull base and vertebrae: No acute fracture or suspicious osseous lesion. Soft tissues and spinal canal: No prevertebral fluid or swelling. No visible canal hematoma. Disc levels: Degenerative changes in the cervical spine. Moderate spinal canal stenosis at C5-C6 and mild spinal canal stenosis at C6-C7. Upper chest: Ground-glass opacities in the imaged right lung (series 5, image 76). No pleural effusion. Aortic atherosclerosis. IMPRESSION: 1. No acute intracranial process. 2. No acute fracture or traumatic listhesis in the cervical spine. 3. Ground-glass opacities in the imaged right lung, which could be infectious or inflammatory. 4. Aortic atherosclerosis. Aortic Atherosclerosis (ICD10-I70.0). Electronically Signed   By: Wiliam Ke M.D.   On: 04/15/2023 19:59   CT Cervical Spine Wo Contrast  Result Date: 04/15/2023 CLINICAL DATA:  Head and neck trauma EXAM:  CT HEAD WITHOUT CONTRAST CT CERVICAL SPINE WITHOUT CONTRAST TECHNIQUE: Multidetector CT imaging of the head and cervical spine was performed following the standard protocol without intravenous contrast. Multiplanar CT image reconstructions of the cervical spine were also generated. RADIATION DOSE REDUCTION: This exam was performed according to the departmental dose-optimization program which includes automated exposure control, adjustment of the mA and/or kV according to patient size and/or use of iterative reconstruction technique. COMPARISON:  01/02/2023 CT head, 04/03/2022 CTA neck, no prior CT cervical spine available FINDINGS: CT HEAD FINDINGS Evaluation is somewhat limited by motion artifact. Brain: No evidence of acute infarct, hemorrhage, mass, mass effect, or midline shift. No hydrocephalus or extra-axial fluid collection. Periventricular white matter changes, likely the sequela of chronic small vessel ischemic disease. Vascular: No hyperdense vessel. Atherosclerotic calcifications in the intracranial carotid and vertebral arteries. Skull: Negative for fracture or focal lesion. Sinuses/Orbits: No acute finding. CT CERVICAL SPINE FINDINGS Alignment: No traumatic listhesis. Skull base and vertebrae: No acute fracture or suspicious osseous lesion. Soft tissues and spinal canal: No prevertebral fluid or swelling.  No visible canal hematoma. Disc levels: Degenerative changes in the cervical spine. Moderate spinal canal stenosis at C5-C6 and mild spinal canal stenosis at C6-C7. Upper chest: Ground-glass opacities in the imaged right lung (series 5, image 76). No pleural effusion. Aortic atherosclerosis. IMPRESSION: 1. No acute intracranial process. 2. No acute fracture or traumatic listhesis in the cervical spine. 3. Ground-glass opacities in the imaged right lung, which could be infectious or inflammatory. 4. Aortic atherosclerosis. Aortic Atherosclerosis (ICD10-I70.0). Electronically Signed   By: Wiliam Ke  M.D.   On: 04/15/2023 19:59   US Abdomen Limited RUQ (LIVER/GB)  Result Date: 04/15/2023 CLINICAL DATA:  Elevated liver enzyme EXAM: ULTRASOUND ABDOMEN LIMITED RIGHT UPPER QUADRANT COMPARISON:  09/12/2016 right upper quadrant ultrasound FINDINGS: Gallbladder: No gallstones or wall thickening visualized. No sonographic Murphy sign noted by sonographer. Common bile duct: Diameter: 2 mm, within normal limits. No intrahepatic biliary ductal dilatation Liver: No focal lesion identified. Increased parenchymal echogenicity. Portal vein is patent on color Doppler imaging with normal direction of blood flow towards the liver. Other: No left lateral decubitus images could be obtained, as the patient asked to terminate the exam. IMPRESSION: Hepatic steatosis. Electronically Signed   By: Wiliam Ke M.D.   On: 04/15/2023 19:48   DG Hip Unilat W or Wo Pelvis 2-3 Views Right  Result Date: 04/15/2023 CLINICAL DATA:  Recent fall with right hip pain, initial EXAM: DG HIP (WITH OR WITHOUT PELVIS) 3V RIGHT COMPARISON:  None Available. FINDINGS: No acute fracture or dislocation is noted. Vascular calcifications are seen. Mild degenerative changes of the hip joints are noted. No soft tissue abnormality is seen. IMPRESSION: No acute abnormality noted. Electronically Signed   By: Alcide Clever M.D.   On: 04/15/2023 19:18   DG Chest Portable 1 View  Result Date: 04/15/2023 CLINICAL DATA:  Pain after fall EXAM: PORTABLE CHEST 1 VIEW COMPARISON:  Chest x-ray 01/02/2023 FINDINGS: No pneumothorax, effusion or edema. Normal cardiopericardial silhouette. Calcified aorta. Slight ill-defined hazy opacity at the right lung base. A subtle infiltrates possible. Recommend follow-up. Overlapping cardiac leads. IMPRESSION: Slight ill-defined hazy opacity in the right lung base, possible subtle infiltrate. Recommend follow-up Electronically Signed   By: Karen Kays M.D.   On: 04/15/2023 18:08    Pending Labs Unresulted Labs (From admission,  onward)     Start     Ordered   04/22/23 0500  Creatinine, serum  (enoxaparin (LOVENOX)    CrCl >/= 30 ml/min)  Weekly,   R     Comments: while on enoxaparin therapy    04/15/23 2105   04/16/23 0500  Lipid panel  (Labs)  Tomorrow morning,   R       Comments: Fasting    04/15/23 2105   04/16/23 0500  Protime-INR  Tomorrow morning,   R        04/15/23 2105   04/16/23 0500  APTT  Tomorrow morning,   R        04/15/23 2105   04/16/23 0500  Hepatic function panel  Tomorrow morning,   R        04/15/23 2118   04/16/23 0500  Hepatitis panel, acute  Tomorrow morning,   R        04/15/23 2118   04/16/23 0500  CK  Tomorrow morning,   R        04/15/23 2119   04/16/23 0500  RPR  Tomorrow morning,   R        04/15/23 2125  04/15/23 2125  HIV Antibody (routine testing w rflx)  (HIV Antibody (Routine testing w reflex) panel)  Once,   R        04/15/23 2125   04/15/23 2123  SARS Coronavirus 2 by RT PCR (hospital order, performed in [facility] hospital lab) *cepheid single result test* Anterior Nasal Swab  (Tier 2 - SARS Coronavirus 2 by RT PCR (hospital order, performed in [facility] hospital lab) *cepheid single result test*)  Once,   R        04/15/23 2122   04/15/23 2123  Respiratory (~20 pathogens) panel by PCR  (Respiratory panel by PCR (~20 pathogens, ~24 hr TAT)  w precautions)  Once,   R        04/15/23 2122   04/15/23 2121  Lactic acid, plasma  STAT Now then every 3 hours,   R      04/15/23 2120   04/15/23 2117  Creatinine, urine, random  Once,   R        04/15/23 2116   04/15/23 2117  Sodium, urine, random  Once,   R        04/15/23 2116   04/15/23 2117  Urinalysis, Complete w Microscopic -Urine, Catheterized  Once,   R       Question:  Specimen Source  Answer:  Urine, Catheterized   04/15/23 2116   04/15/23 2102  CBC  (enoxaparin (LOVENOX)    CrCl >/= 30 ml/min)  Once,   R       Comments: Baseline for enoxaparin therapy IF NOT ALREADY DRAWN.  Notify MD if PLT < 100 K.     04/15/23 2105   04/15/23 2102  Creatinine, serum  (enoxaparin (LOVENOX)    CrCl >/= 30 ml/min)  Once,   R       Comments: Baseline for enoxaparin therapy IF NOT ALREADY DRAWN.    04/15/23 2105   04/15/23 2100  Vitamin B12  Once,   URGENT        04/15/23 2059   04/15/23 2100  TSH  Once,   URGENT        04/15/23 2059   04/15/23 1830  Blood culture (routine x 2)  BLOOD CULTURE X 2,   R      04/15/23 1830   04/15/23 1808  Culture, blood (single)  Once,   STAT        04/15/23 1807   Signed and Held  Levetiracetam level  Add-on,   R        Signed and Held            Vitals/Pain Today's Vitals   04/15/23 1830 04/15/23 1835 04/15/23 1837 04/15/23 2030  BP: (!) 170/149 (!) 184/118  133/82  Pulse:    (!) 125  Resp: 17 14  14   Temp:   97.8 F (36.6 C) 97.8 F (36.6 C)  TempSrc:   Oral   SpO2:    98%  Weight:      Height:      PainSc:        Isolation Precautions Droplet precaution  Medications Medications  thiamine (VITAMIN B1) 500 mg in sodium chloride 0.9 % 50 mL IVPB (500 mg Intravenous New Bag/Given 04/15/23 2156)   stroke: early stages of recovery book (has no administration in time range)  0.9 %  sodium chloride infusion (0 mLs Intravenous Hold 04/15/23 2156)  acetaminophen (TYLENOL) tablet 650 mg (has no administration in time range)    Or  acetaminophen (TYLENOL) 160 MG/5ML solution 650 mg (has no administration in time range)    Or  acetaminophen (TYLENOL) suppository 650 mg (has no administration in time range)  senna-docusate (Senokot-S) tablet 1 tablet (has no administration in time range)  enoxaparin (LOVENOX) injection 40 mg (has no administration in time range)  LORazepam (ATIVAN) injection 1 mg (has no administration in time range)  cefTRIAXone (ROCEPHIN) 1 g in sodium chloride 0.9 % 100 mL IVPB (has no administration in time range)  azithromycin (ZITHROMAX) 500 mg in sodium chloride 0.9 % 250 mL IVPB (has no administration in time range)  labetalol  (NORMODYNE) injection 20 mg (has no administration in time range)  hydrALAZINE (APRESOLINE) injection 10 mg (has no administration in time range)  sodium bicarbonate tablet 650 mg (0 mg Oral Hold 04/15/23 2208)  lactated ringers infusion ( Intravenous New Bag/Given 04/15/23 2154)  insulin aspart (novoLOG) injection 0-9 Units (has no administration in time range)  insulin aspart (novoLOG) injection 0-5 Units (0 Units Subcutaneous Hold 04/15/23 2151)  sodium chloride 0.9 % bolus 1,000 mL (0 mLs Intravenous Stopped 04/15/23 2152)  cefTRIAXone (ROCEPHIN) 1 g in sodium chloride 0.9 % 100 mL IVPB (0 g Intravenous Stopped 04/15/23 2049)  azithromycin (ZITHROMAX) 500 mg in sodium chloride 0.9 % 250 mL IVPB (0 mg Intravenous Stopped 04/15/23 2142)  lactated ringers bolus 1,000 mL (1,000 mLs Intravenous New Bag/Given 04/15/23 2152)    Mobility unknown     Focused Assessments Pulmonary Assessment Handoff:  Lung sounds:   O2 Device: Room Air      R Recommendations: See Admitting Provider Note  Report given to:   Additional Notes: Pt confused being admitted for pnumonia hypoxia aki rhabdo CBG 108 follows commands not trying to get out of bed tachycardic otherwise vs normal is holding breathe when breathing voids via bed pan

## 2023-04-15 NOTE — ED Triage Notes (Signed)
Pt BIB EMS for AMS. LSN Saturday. Family found pt on floor today covered in urine and confused. Pt usually axox4. Pt axox2 on arrival.

## 2023-04-15 NOTE — ED Notes (Signed)
RN called phlebotomy to get 2nd culture.

## 2023-04-15 NOTE — Assessment & Plan Note (Signed)
This is traumatic likely due to patient being found on the ground for prolonged period of time.  It cannot be exactly ascertain how long the patient was on the ground for.  We will trend the CK and continue with IV fluids

## 2023-04-15 NOTE — H&P (Addendum)
History and Physical    Patient: [name] ZOX:[id number] DOB: Jun 14, 1946 DOA: 04/15/2023 DOS: the patient was seen and examined on 04/15/2023 PCP: Garlan Fillers, MD  Patient coming from: Home  Chief Complaint:  Chief Complaint  Patient presents with   Altered Mental Status   HPI: [name] is a 77 y.o. female with medical history significant of living by herself.  Per report patient is typically alert awake and fully oriented.  Unfortunately per report, patient was not heard from in the last 48 hours, a wellness check was performed and the patient was found in her home on the ground covered in urine and feces.  Patient is brought to [facility] ER.  Unfortunately patient is not able to give me an account out what transpired at home.  She does not report that she fell.  Basically she keeps on reporting that her son called her brother and does not report that there was a wellness check done or that she is not able to get back up.  Patient offers no complaints at this time no complaint of headache, no complaint of belly pain chest pain shortness of breath.  ER course is notable for patient being found to be tachycardic, pneumonia, rhabdomyolysis, has received IV fluids and antibiotics Review of Systems: unable to review all systems due to the inability of the patient to answer questions. Past Medical History:  Diagnosis Date   Anxiety    Arthritis    back    Blind loop syndrome    Clotting disorder (HCC)    Hx Clot in leg per pt    Colostomy present [facility])    Coronary artery disease    a. known 60-70% mid-LAD stenosis with caths in 1999, 2002, 2006, and 2012 showing stable anatomy b. 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HCC)    Diabetes mellitus    Diverticulosis    Dizziness    chronic   Dyslipidemia    Fatty liver    GERD (gastroesophageal reflux disease)    hiatial hernia   Heart murmur    Hiatal hernia    Hypertension    Irritable bowel syndrome     Pancreatitis, chronic (HCC)    Peripheral vascular disease (HCC) 02/20/2010   s/p stent of left SFA; carotid stenosis   Seizure (HCC) 11/04/2018   last 1 yr 2020 per pt unsure month    Sleep apnea    no cpap    Stroke (HCC)    Thyroid nodule    Vitamin B12 deficiency    Past Surgical History:  Procedure Laterality Date   ABDOMINAL HYSTERECTOMY     partial   AUGMENTATION MAMMAPLASTY     bilateral 1976 retro    BREAST ENHANCEMENT SURGERY     CARDIAC CATHETERIZATION  07/07/98;08/31/01;03/04/05   60 -70% LAD   CATARACT EXTRACTION Bilateral    COLON RESECTION N/A 10/14/2017   Procedure: EXPLORATORY LAPAROTOMY, EXTENDED RIGHT COLECTOMY; APPLICATION OF ABDOMINAL VACUUM DRESSING;  Surgeon: Griselda Miner, MD;  Location: [facility] ORS;  Service: General;  Laterality: N/A;   COLONOSCOPY     CORONARY ATHERECTOMY N/A 02/12/2023   Procedure: CORONARY ATHERECTOMY;  Surgeon: Swaziland, Peter M, MD;  Location: [facility] INVASIVE CV LAB;  Service: Cardiovascular;  Laterality: N/A;   CORONARY STENT INTERVENTION N/A 02/12/2023   Procedure: CORONARY STENT INTERVENTION;  Surgeon: Swaziland, Peter M, MD;  Location: [facility] INVASIVE CV LAB;  Service: Cardiovascular;  Laterality: N/A;   CORONARY  ULTRASOUND/IVUS N/A 02/12/2023   Procedure: Coronary Ultrasound/IVUS;  Surgeon: Swaziland, Peter M, MD;  Location: [facility] INVASIVE CV LAB;  Service: Cardiovascular;  Laterality: N/A;   ENDOBRONCHIAL ULTRASOUND Bilateral 02/24/2017   Procedure: ENDOBRONCHIAL ULTRASOUND;  Surgeon: Leslye Peer, MD;  Location: [facility] ENDOSCOPY;  Service: Cardiopulmonary;  Laterality: Bilateral;   FEMORAL ARTERY STENT     Left leg   LAPAROTOMY N/A 10/16/2017   Procedure: EXPLORATORY LAPAROTOMY, PARTIAL OMENTECTOMY, RESECTION ISCHEMIC ILEUM 130CM, APPLICATION OF VAC ABDOMINAL DRESSING;  Surgeon: Darnell Level, MD;  Location: [facility] ORS;  Service: General;  Laterality: N/A;   LAPAROTOMY N/A 10/18/2017   Procedure: RE-EXPLORATION OF ABDOMEN, ILEOSTOMY CREATION;  Surgeon:  Almond Lint, MD;  Location: [facility] ORS;  Service: General;  Laterality: N/A;   LEFT HEART CATH AND CORONARY ANGIOGRAPHY N/A 01/29/2023   Procedure: LEFT HEART CATH AND CORONARY ANGIOGRAPHY;  Surgeon: Swaziland, Peter M, MD;  Location: [facility] INVASIVE CV LAB;  Service: Cardiovascular;  Laterality: N/A;   LUNG SURGERY  03/2017   Benign polyps removed   PATELLA REALIGNMENT Left    POLYPECTOMY     SIGMOIDOSCOPY     TONSILLECTOMY     TRANSCAROTID ARTERY REVASCULARIZATION  Right 04/18/2022   Procedure: Transcarotid Artery Revascularization;  Surgeon: Victorino Sparrow, MD;  Location: [facility] OR;  Service: Vascular;  Laterality: Right;   Social History:  reports that she quit smoking about 12 years ago. Her smoking use included cigarettes. She has a 50.00 pack-year smoking history. She has never used smokeless tobacco. She reports that she does not drink alcohol and does not use drugs.  Allergies  Allergen Reactions   Adhesive [Tape] Other (See Comments)    "Took off my skin"   Other Rash    "Took off my skin"   Ace Inhibitors Other (See Comments)    Unknown   Sulfa Antibiotics Other (See Comments)    Unknown   Topiramate     unknown   Codeine Other (See Comments)    Altered mental state    Iodinated Contrast Media Rash   Latex Rash    Family History  Problem Relation Age of Onset   Hypertension Mother    Osteoarthritis Mother    Pulmonary embolism Mother    Hypertension Father        aneurysm   Stroke Father    Colon cancer Paternal Grandfather    Stroke Other    Breast cancer Neg Hx    Colon polyps Neg Hx    Esophageal cancer Neg Hx    Rectal cancer Neg Hx    Stomach cancer Neg Hx    Pancreatic cancer Neg Hx    Liver disease Neg Hx     Prior to Admission medications   Medication Sig Start Date End Date Taking? Authorizing Provider  acetaminophen (TYLENOL) 325 MG tablet Take 650 mg by mouth every 6 (six) hours as needed for mild pain.    [provider]  albuterol (VENTOLIN  HFA) 108 (90 Base) MCG/ACT inhaler Inhale 1-2 puffs into the lungs every 6 (six) hours as needed for wheezing or shortness of breath. 03/30/21   [provider]  amLODipine (NORVASC) 10 MG tablet Take 10 mg by mouth daily.    [provider]  aspirin 81 MG chewable tablet Chew 1 tablet (81 mg total) by mouth daily. 04/19/22   Tyrone Nine, MD  atorvastatin (LIPITOR) 40 MG tablet Take 1 tablet (40 mg total) by mouth daily. 04/19/22   Tyrone Nine,  MD  cloNIDine (CATAPRES) 0.1 MG tablet Take 1 tablet (0.1 mg total) by mouth at bedtime. 02/18/23   [place], Peter M, MD  clopidogrel (PLAVIX) 75 MG tablet Take 1 tablet (75 mg total) by mouth daily. 04/19/22   Tyrone Nine, MD  colestipol (COLESTID) 1 g tablet Take 2 tablets (2 g total) by mouth 2 (two) times daily. 03/31/23   Doree Albee, PA-C  [employer] DIGESTIVE PROBIOTIC 250 MG capsule Take 500 mg by mouth daily. 06/27/21   [provider]  empagliflozin (JARDIANCE) 10 MG TABS tablet Take 1 tablet (10 mg total) by mouth daily before breakfast. 02/13/23   Perlie Gold, PA-C  esomeprazole (NEXIUM) 40 MG capsule Take 40 mg by mouth 2 (two) times daily before a meal. 01/11/20   [provider]  fluticasone (FLONASE) 50 MCG/ACT nasal spray Place 2 sprays into both nostrils daily as needed for allergies. 01/29/16   [provider]  furosemide (LASIX) 20 MG tablet Take 1 tablet (20 mg total) by mouth daily as needed. 02/13/23   Perlie Gold, PA-C  hydrALAZINE (APRESOLINE) 100 MG tablet Take 1 tablet (100 mg total) by mouth every 8 (eight) hours. 01/04/23   Leroy Sea, MD  HYDROcodone-acetaminophen (NORCO) 10-325 MG tablet Take 1 tablet by mouth every 6 (six) hours as needed. PRN 02/12/23   [provider]  isosorbide mononitrate (IMDUR) 60 MG 24 hr tablet Take 1 tablet (60 mg total) by mouth daily. 01/29/23   [place], Peter M, MD  levETIRAcetam (KEPPRA) 500 MG tablet TAKE 1 TABLET BY MOUTH TWICE A DAY Patient taking  differently: Take 500 mg by mouth 2 (two) times daily. 01/20/20   Dohmeier, Porfirio Mylar, MD  losartan (COZAAR) 50 MG tablet TAKE ONE TABLET BY MOUTH EVERY DAY 01/22/23   [place], Peter M, MD  magnesium oxide (MAG-OX) 400 MG tablet Take 1 tablet by mouth daily. 05/08/20   [provider]  meclizine (ANTIVERT) 25 MG tablet Take 0.5 tablets (12.5 mg total) by mouth 3 (three) times daily as needed for dizziness. 01/04/23   Leroy Sea, MD  metoprolol succinate (TOPROL-XL) 100 MG 24 hr tablet Take 100 mg by mouth daily. 12/20/21   [provider]  mirtazapine (REMERON) 15 MG tablet Take 15 mg by mouth at bedtime. 07/18/21   [provider]  montelukast (SINGULAIR) 10 MG tablet Take 10 mg by mouth daily. 08/09/18   [provider]  nitroGLYCERIN (NITROSTAT) 0.4 MG SL tablet Place 1 tablet (0.4 mg total) under the tongue every 5 (five) minutes as needed for chest pain. 01/23/23   [place], Peter M, MD  ondansetron (ZOFRAN) 4 MG tablet Take 1 tablet (4 mg total) by mouth every 6 (six) hours as needed for nausea or vomiting. 10/24/21   Esterwood, Amy S, PA-C  Pancrelipase, Lip-Prot-Amyl, (ZENPEP) 40000-126000 units CPEP Take 2 capsules with meals and one capsule with snacks. Patient taking differently: Take 1 capsule by mouth daily with breakfast. 06/08/21   Meryl Dare, MD  pantoprazole (PROTONIX) 40 MG tablet Take 1 tablet (40 mg total) by mouth 2 (two) times daily. 01/31/22   Meryl Dare, MD  Polysaccharide-Iron Complex 150 MG CAPS Take 1 tablet by mouth daily.    [provider]  triamterene-hydrochlorothiazide (MAXZIDE-25) 37.5-25 MG tablet Take 0.5 tablets by mouth daily. 03/23/23   [provider]  venlafaxine XR (EFFEXOR-XR) 75 MG 24 hr capsule Take 1 capsule (75 mg total) by mouth daily. 05/18/20   [provider]  vitamin B-12 (CYANOCOBALAMIN) 500 MCG tablet Take 500 mcg by mouth daily.    [provider]  Vitamin D, Ergocalciferol,  (DRISDOL) 1.25 MG (50000 UT) CAPS capsule Take 50,000 Units by mouth every Wednesday.     [provider]    Physical Exam: Vitals:   04/15/23 1830 04/15/23 1835 04/15/23 1837 04/15/23 2030  BP: (!) 170/149 (!) 184/118  133/82  Pulse:    (!) 125  Resp: 17 14  14   Temp:   97.8 F (36.6 C) 97.8 F (36.6 C)  TempSrc:   Oral   SpO2:    98%  Weight:      Height:       General: Patient does not appear to be in any distress.  Is pretty alert and awake and verbose.  However is not often making eye contact during conversation.  Although patient has no focal motor deficit, it is noted that when asked patient's for example to bend her knees, instead of bending her knees she talked about how she cannot do it and how she is able to do it etc. patient is oriented to her location and able to give me her name and date of birth.  Is not oriented to time of day. Respiratory exam: Bilateral intravesicular Cardiovascular exam S1-S2 normal Abdomen all quadrants are soft without guarding or rebound.  Patient is complaining of some tenderness in the right upper quadrant which does not seem to be focal. Extremities warm without edema Patient demonstrates antigravity strength in all 4 extremities, however unable to sit up unassisted. Data Reviewed:  Labs on Admission:  Results for orders placed or performed during the hospital encounter of 04/15/23 (from the past 24 hour(s))  CBG monitoring, ED     Status: Abnormal   Collection Time: 04/15/23  5:29 PM  Result Value Ref Range   Glucose-Capillary 150 (H) 70 - 99 mg/dL  CBC with Differential     Status: Abnormal   Collection Time: 04/15/23  5:30 PM  Result Value Ref Range   WBC 19.5 (H) 4.0 - 10.5 K/uL   RBC 3.92 3.87 - 5.11 MIL/uL   Hemoglobin 11.7 (L) 12.0 - 15.0 g/dL   HCT 96.2 95.2 - 84.1 %   MCV 93.1 80.0 - 100.0 fL   MCH 29.8 26.0 - 34.0 pg   MCHC 32.1 30.0 - 36.0 g/dL   RDW 32.4 40.1 - 02.7 %   Platelets 205 150 - 400 K/uL   nRBC 0.0  0.0 - 0.2 %   Neutrophils Relative % 95 %   Neutro Abs 18.4 (H) 1.7 - 7.7 K/uL   Lymphocytes Relative 2 %   Lymphs Abs 0.4 (L) 0.7 - 4.0 K/uL   Monocytes Relative 2 %   Monocytes Absolute 0.4 0.1 - 1.0 K/uL   Eosinophils Relative 0 %   Eosinophils Absolute 0.0 0.0 - 0.5 K/uL   Basophils Relative 0 %   Basophils Absolute 0.1 0.0 - 0.1 K/uL   Immature Granulocytes 1 %   Abs Immature Granulocytes 0.26 (H) 0.00 - 0.07 K/uL  Comprehensive metabolic panel     Status: Abnormal   Collection Time: 04/15/23  5:30 PM  Result Value Ref Range   Sodium 135 135 - 145 mmol/L   Potassium 3.8 3.5 - 5.1 mmol/L   Chloride 105 98 - 111 mmol/L   CO2 14 (L) 22 - 32 mmol/L   Glucose, Bld 169 (H) 70 - 99 mg/dL   BUN 56 (H) 8 -  23 mg/dL   Creatinine, Ser 4.09 (H) 0.44 - 1.00 mg/dL   Calcium 9.3 8.9 - 81.1 mg/dL   Total Protein 6.4 (L) 6.5 - 8.1 g/dL   Albumin 3.0 (L) 3.5 - 5.0 g/dL   AST 914 (H) 15 - 41 U/L   ALT 69 (H) 0 - 44 U/L   Alkaline Phosphatase 101 38 - 126 U/L   Total Bilirubin 0.9 0.3 - 1.2 mg/dL   GFR, Estimated 11 (L) >60 mL/min   Anion gap 16 (H) 5 - 15  CK     Status: Abnormal   Collection Time: 04/15/23  5:30 PM  Result Value Ref Range   Total CK 2,965 (H) 38 - 234 U/L  Lactic acid, plasma     Status: Abnormal   Collection Time: 04/15/23  6:08 PM  Result Value Ref Range   Lactic Acid, Venous 2.8 (HH) 0.5 - 1.9 mmol/L  Lactic acid, plasma     Status: Abnormal   Collection Time: 04/15/23  8:00 PM  Result Value Ref Range   Lactic Acid, Venous 2.7 (HH) 0.5 - 1.9 mmol/L   Basic Metabolic Panel: Recent Labs  Lab 04/15/23 1730  NA 135  K 3.8  CL 105  CO2 14*  GLUCOSE 169*  BUN 56*  CREATININE 4.00*  CALCIUM 9.3   Liver Function Tests: Recent Labs  Lab 04/15/23 1730  AST 171*  ALT 69*  ALKPHOS 101  BILITOT 0.9  PROT 6.4*  ALBUMIN 3.0*   No results for input(s): "LIPASE", "AMYLASE" in the last 168 hours. No results for input(s): "AMMONIA" in the last 168  hours. CBC: Recent Labs  Lab 04/15/23 1730  WBC 19.5*  NEUTROABS 18.4*  HGB 11.7*  HCT 36.5  MCV 93.1  PLT 205   Cardiac Enzymes: Recent Labs  Lab 04/15/23 1730  CKTOTAL 2,965*    BNP (last 3 results) No results for input(s): "PROBNP" in the last 8760 hours. CBG: Recent Labs  Lab 04/15/23 1729  GLUCAP 150*    Radiological Exams on Admission:  CT Head Wo Contrast  Result Date: 04/15/2023 CLINICAL DATA:  Head and neck trauma EXAM: CT HEAD WITHOUT CONTRAST CT CERVICAL SPINE WITHOUT CONTRAST TECHNIQUE: Multidetector CT imaging of the head and cervical spine was performed following the standard protocol without intravenous contrast. Multiplanar CT image reconstructions of the cervical spine were also generated. RADIATION DOSE REDUCTION: This exam was performed according to the departmental dose-optimization program which includes automated exposure control, adjustment of the mA and/or kV according to patient size and/or use of iterative reconstruction technique. COMPARISON:  01/02/2023 CT head, 04/03/2022 CTA neck, no prior CT cervical spine available FINDINGS: CT HEAD FINDINGS Evaluation is somewhat limited by motion artifact. Brain: No evidence of acute infarct, hemorrhage, mass, mass effect, or midline shift. No hydrocephalus or extra-axial fluid collection. Periventricular white matter changes, likely the sequela of chronic small vessel ischemic disease. Vascular: No hyperdense vessel. Atherosclerotic calcifications in the intracranial carotid and vertebral arteries. Skull: Negative for fracture or focal lesion. Sinuses/Orbits: No acute finding. CT CERVICAL SPINE FINDINGS Alignment: No traumatic listhesis. Skull base and vertebrae: No acute fracture or suspicious osseous lesion. Soft tissues and spinal canal: No prevertebral fluid or swelling. No visible canal hematoma. Disc levels: Degenerative changes in the cervical spine. Moderate spinal canal stenosis at C5-C6 and mild spinal canal  stenosis at C6-C7. Upper chest: Ground-glass opacities in the imaged right lung (series 5, image 76). No pleural effusion. Aortic atherosclerosis. IMPRESSION: 1. No acute intracranial  process. 2. No acute fracture or traumatic listhesis in the cervical spine. 3. Ground-glass opacities in the imaged right lung, which could be infectious or inflammatory. 4. Aortic atherosclerosis. Aortic Atherosclerosis (ICD10-I70.0). Electronically Signed   By: Wiliam Ke M.D.   On: 04/15/2023 19:59   CT Cervical Spine Wo Contrast  Result Date: 04/15/2023 CLINICAL DATA:  Head and neck trauma EXAM: CT HEAD WITHOUT CONTRAST CT CERVICAL SPINE WITHOUT CONTRAST TECHNIQUE: Multidetector CT imaging of the head and cervical spine was performed following the standard protocol without intravenous contrast. Multiplanar CT image reconstructions of the cervical spine were also generated. RADIATION DOSE REDUCTION: This exam was performed according to the departmental dose-optimization program which includes automated exposure control, adjustment of the mA and/or kV according to patient size and/or use of iterative reconstruction technique. COMPARISON:  01/02/2023 CT head, 04/03/2022 CTA neck, no prior CT cervical spine available FINDINGS: CT HEAD FINDINGS Evaluation is somewhat limited by motion artifact. Brain: No evidence of acute infarct, hemorrhage, mass, mass effect, or midline shift. No hydrocephalus or extra-axial fluid collection. Periventricular white matter changes, likely the sequela of chronic small vessel ischemic disease. Vascular: No hyperdense vessel. Atherosclerotic calcifications in the intracranial carotid and vertebral arteries. Skull: Negative for fracture or focal lesion. Sinuses/Orbits: No acute finding. CT CERVICAL SPINE FINDINGS Alignment: No traumatic listhesis. Skull base and vertebrae: No acute fracture or suspicious osseous lesion. Soft tissues and spinal canal: No prevertebral fluid or swelling. No visible canal  hematoma. Disc levels: Degenerative changes in the cervical spine. Moderate spinal canal stenosis at C5-C6 and mild spinal canal stenosis at C6-C7. Upper chest: Ground-glass opacities in the imaged right lung (series 5, image 76). No pleural effusion. Aortic atherosclerosis. IMPRESSION: 1. No acute intracranial process. 2. No acute fracture or traumatic listhesis in the cervical spine. 3. Ground-glass opacities in the imaged right lung, which could be infectious or inflammatory. 4. Aortic atherosclerosis. Aortic Atherosclerosis (ICD10-I70.0). Electronically Signed   By: Wiliam Ke M.D.   On: 04/15/2023 19:59   US Abdomen Limited RUQ (LIVER/GB)  Result Date: 04/15/2023 CLINICAL DATA:  Elevated liver enzyme EXAM: ULTRASOUND ABDOMEN LIMITED RIGHT UPPER QUADRANT COMPARISON:  09/12/2016 right upper quadrant ultrasound FINDINGS: Gallbladder: No gallstones or wall thickening visualized. No sonographic Murphy sign noted by sonographer. Common bile duct: Diameter: 2 mm, within normal limits. No intrahepatic biliary ductal dilatation Liver: No focal lesion identified. Increased parenchymal echogenicity. Portal vein is patent on color Doppler imaging with normal direction of blood flow towards the liver. Other: No left lateral decubitus images could be obtained, as the patient asked to terminate the exam. IMPRESSION: Hepatic steatosis. Electronically Signed   By: Wiliam Ke M.D.   On: 04/15/2023 19:48   DG Hip Unilat W or Wo Pelvis 2-3 Views Right  Result Date: 04/15/2023 CLINICAL DATA:  Recent fall with right hip pain, initial EXAM: DG HIP (WITH OR WITHOUT PELVIS) 3V RIGHT COMPARISON:  None Available. FINDINGS: No acute fracture or dislocation is noted. Vascular calcifications are seen. Mild degenerative changes of the hip joints are noted. No soft tissue abnormality is seen. IMPRESSION: No acute abnormality noted. Electronically Signed   By: Alcide Clever M.D.   On: 04/15/2023 19:18   DG Chest Portable 1  View  Result Date: 04/15/2023 CLINICAL DATA:  Pain after fall EXAM: PORTABLE CHEST 1 VIEW COMPARISON:  Chest x-ray 01/02/2023 FINDINGS: No pneumothorax, effusion or edema. Normal cardiopericardial silhouette. Calcified aorta. Slight ill-defined hazy opacity at the right lung base. A subtle infiltrates possible. Recommend  follow-up. Overlapping cardiac leads. IMPRESSION: Slight ill-defined hazy opacity in the right lung base, possible subtle infiltrate. Recommend follow-up Electronically Signed   By: Karen Kays M.D.   On: 04/15/2023 18:08    EKG: Independently reviewed. Sinus tachycarda.   Assessment and Plan: Rhabdomyolysis This is traumatic likely due to patient being found on the ground for prolonged period of time.  It cannot be exactly ascertain how long the patient was on the ground for.  We will trend the CK and continue with IV fluids  CAP (community acquired pneumonia) Not associated with hypoxia per se, however patient does have marked tachycardia, leukocytosis, and low bicarbonate.  We will trend the lactic acid which is elevated.  Patient does meet sepsis criteria.  Antibiotics ceftriaxone and azithromycin ordered.  I will check COVID  Acute metabolic encephalopathy Patient has rather marked amnesia.   She does not even seem to recall that she was on the floor for prolonged period of time.  Therefore at this time I have started the patient on empiric thiamine, I am doing swallow screen and then starting the diet thereafter accordingly.  We will give the patient empiric thiamine, check B12 TSH RPR.   Patient on keppra at home. Check EEG.  Patient has a recent MRI brain in the chart from March 2024.  However if patient's symptoms do not resolve I have a low threshold for repeating the MRI.  At this time we will monitor clinically  Acute kidney injury superimposed on CKD (HCC) Check intake output, check urinalysis, bladder scan once.  Will treat with IV fluids as the clinical presentation  is most consistent with a prerenal AKI.  Associated with acidosis, slightly elevated anion gap.  Will treat with sodium bicarbonate.  Fatty liver Trend LFTs, check acute hepatitis panel  Chronic pancreatitis (HCC) C.w. pancreatic enzy,es  Accelerated hypertension Is a chronic diagnosis.  However patient is noted to have uncontrolled hypertension here in the hospital.  I will treat with IV labetalol and hydralazine as needed.  Associated with marked sinus tachycardia, therefore I will check a TSH as well.   Home medication reconciliation pending   Advance Care Planning:   Code Status: Prior full code. Patient does not report having signed a DNR  Consults: none at this time.  Family Communication: family brougth patient to hospital. They are aware of hsopitalisation.  Severity of Illness: The appropriate patient status for this patient is INPATIENT. Inpatient status is judged to be reasonable and necessary in order to provide the required intensity of service to ensure the patient's safety. The patient's presenting symptoms, physical exam findings, and initial radiographic and laboratory data in the context of their chronic comorbidities is felt to place them at high risk for further clinical deterioration. Furthermore, it is not anticipated that the patient will be medically stable for discharge from the hospital within 2 midnights of admission.   * I certify that at the point of admission it is my clinical judgment that the patient will require inpatient hospital care spanning beyond 2 midnights from the point of admission due to high intensity of service, high risk for further deterioration and high frequency of surveillance required.*  Author: Nolberto Hanlon, MD 04/15/2023 9:05 PM  For on call review [email or web address].

## 2023-04-15 NOTE — Assessment & Plan Note (Signed)
Is a chronic diagnosis.  However patient is noted to have uncontrolled hypertension here in the hospital.  I will treat with IV labetalol and hydralazine as needed.  Associated with marked sinus tachycardia, therefore I will check a TSH as well.

## 2023-04-15 NOTE — ED Notes (Signed)
Report called to Jae rn

## 2023-04-15 NOTE — Assessment & Plan Note (Signed)
Check intake output, check urinalysis, bladder scan once.  Will treat with IV fluids as the clinical presentation is most consistent with a prerenal AKI.  Associated with acidosis, slightly elevated anion gap.  Will treat with sodium bicarbonate.

## 2023-04-15 NOTE — Assessment & Plan Note (Signed)
C.w. pancreatic enzy,es

## 2023-04-16 ENCOUNTER — Ambulatory Visit (HOSPITAL_COMMUNITY): Payer: Medicare HMO

## 2023-04-16 ENCOUNTER — Inpatient Hospital Stay (HOSPITAL_COMMUNITY): Payer: Medicare HMO

## 2023-04-16 DIAGNOSIS — R410 Disorientation, unspecified: Secondary | ICD-10-CM | POA: Diagnosis not present

## 2023-04-16 DIAGNOSIS — R569 Unspecified convulsions: Secondary | ICD-10-CM

## 2023-04-16 LAB — CBC
HCT: 31.3 % — ABNORMAL LOW (ref 36.0–46.0)
Hemoglobin: 10.5 g/dL — ABNORMAL LOW (ref 12.0–15.0)
MCH: 31.3 pg (ref 26.0–34.0)
MCHC: 33.5 g/dL (ref 30.0–36.0)
MCV: 93.2 fL (ref 80.0–100.0)
Platelets: 150 10*3/uL (ref 150–400)
RBC: 3.36 MIL/uL — ABNORMAL LOW (ref 3.87–5.11)
RDW: 13.5 % (ref 11.5–15.5)
WBC: 15.2 10*3/uL — ABNORMAL HIGH (ref 4.0–10.5)
nRBC: 0 % (ref 0.0–0.2)

## 2023-04-16 LAB — BLOOD CULTURE ID PANEL (REFLEXED) - BCID2
A.calcoaceticus-baumannii: NOT DETECTED
Bacteroides fragilis: NOT DETECTED
Candida albicans: NOT DETECTED
Candida auris: NOT DETECTED
Candida glabrata: NOT DETECTED
Candida krusei: NOT DETECTED
Candida parapsilosis: NOT DETECTED
Candida tropicalis: NOT DETECTED
Cryptococcus neoformans/gattii: NOT DETECTED
Enterobacter cloacae complex: NOT DETECTED
Enterobacterales: NOT DETECTED
Enterococcus Faecium: NOT DETECTED
Enterococcus faecalis: NOT DETECTED
Escherichia coli: NOT DETECTED
Haemophilus influenzae: NOT DETECTED
Klebsiella aerogenes: NOT DETECTED
Klebsiella oxytoca: NOT DETECTED
Klebsiella pneumoniae: NOT DETECTED
Listeria monocytogenes: NOT DETECTED
Neisseria meningitidis: NOT DETECTED
Proteus species: NOT DETECTED
Pseudomonas aeruginosa: NOT DETECTED
Salmonella species: NOT DETECTED
Serratia marcescens: NOT DETECTED
Staphylococcus aureus (BCID): NOT DETECTED
Staphylococcus epidermidis: NOT DETECTED
Staphylococcus lugdunensis: NOT DETECTED
Staphylococcus species: DETECTED — AB
Stenotrophomonas maltophilia: NOT DETECTED
Streptococcus agalactiae: NOT DETECTED
Streptococcus pneumoniae: NOT DETECTED
Streptococcus pyogenes: NOT DETECTED
Streptococcus species: DETECTED — AB

## 2023-04-16 LAB — BASIC METABOLIC PANEL
Anion gap: 11 (ref 5–15)
BUN: 51 mg/dL — ABNORMAL HIGH (ref 8–23)
CO2: 15 mmol/L — ABNORMAL LOW (ref 22–32)
Calcium: 8.2 mg/dL — ABNORMAL LOW (ref 8.9–10.3)
Chloride: 108 mmol/L (ref 98–111)
Creatinine, Ser: 3.24 mg/dL — ABNORMAL HIGH (ref 0.44–1.00)
GFR, Estimated: 14 mL/min — ABNORMAL LOW (ref >60)
Glucose, Bld: 108 mg/dL — ABNORMAL HIGH (ref 70–99)
Potassium: 3.7 mmol/L (ref 3.5–5.1)
Sodium: 134 mmol/L — ABNORMAL LOW (ref 135–145)

## 2023-04-16 LAB — GLUCOSE, CAPILLARY
Glucose-Capillary: 118 mg/dL — ABNORMAL HIGH (ref 70–99)
Glucose-Capillary: 105 mg/dL — ABNORMAL HIGH (ref 70–99)
Glucose-Capillary: 117 mg/dL — ABNORMAL HIGH (ref 70–99)
Glucose-Capillary: 114 mg/dL — ABNORMAL HIGH (ref 70–99)

## 2023-04-16 LAB — AMMONIA: Ammonia: 16 umol/L (ref 9–35)

## 2023-04-16 LAB — CULTURE, BLOOD (SINGLE)

## 2023-04-16 LAB — APTT: aPTT: 29 seconds (ref 24–36)

## 2023-04-16 LAB — TSH: TSH: 0.803 u[IU]/mL (ref 0.350–4.500)

## 2023-04-16 LAB — PROTIME-INR
INR: 1.3 — ABNORMAL HIGH (ref 0.8–1.2)
Prothrombin Time: 16.6 seconds — ABNORMAL HIGH (ref 11.4–15.2)

## 2023-04-16 LAB — HEPATIC FUNCTION PANEL
ALT: 54 U/L — ABNORMAL HIGH (ref 0–44)
AST: 111 U/L — ABNORMAL HIGH (ref 15–41)
Albumin: 2.4 g/dL — ABNORMAL LOW (ref 3.5–5.0)
Alkaline Phosphatase: 92 U/L (ref 38–126)
Bilirubin, Direct: 0.2 mg/dL (ref 0.0–0.2)
Indirect Bilirubin: 0.4 mg/dL (ref 0.3–0.9)
Total Bilirubin: 0.6 mg/dL (ref 0.3–1.2)
Total Protein: 5.3 g/dL — ABNORMAL LOW (ref 6.5–8.1)

## 2023-04-16 LAB — HEPATITIS PANEL, ACUTE
HCV Ab: NONREACTIVE
Hep A IgM: NONREACTIVE
Hep B C IgM: NONREACTIVE
Hepatitis B Surface Ag: NONREACTIVE

## 2023-04-16 LAB — LIPID PANEL
Cholesterol: 104 mg/dL (ref 0–200)
HDL: 30 mg/dL — ABNORMAL LOW (ref >40)
LDL Cholesterol: 46 mg/dL (ref 0–99)
Total CHOL/HDL Ratio: 3.5 RATIO
Triglycerides: 138 mg/dL (ref <150)
VLDL: 28 mg/dL (ref 0–40)

## 2023-04-16 LAB — CORTISOL: Cortisol, Plasma: 14.2 ug/dL

## 2023-04-16 LAB — C-REACTIVE PROTEIN: CRP: 22.5 mg/dL — ABNORMAL HIGH (ref <1.0)

## 2023-04-16 LAB — CK: Total CK: 1596 U/L — ABNORMAL HIGH (ref 38–234)

## 2023-04-16 LAB — PROCALCITONIN: Procalcitonin: 12.1 ng/mL

## 2023-04-16 LAB — BRAIN NATRIURETIC PEPTIDE: B Natriuretic Peptide: 505.6 pg/mL — ABNORMAL HIGH (ref 0.0–100.0)

## 2023-04-16 LAB — MAGNESIUM: Magnesium: 1.1 mg/dL — ABNORMAL LOW (ref 1.7–2.4)

## 2023-04-16 LAB — LACTIC ACID, PLASMA: Lactic Acid, Venous: 2.3 mmol/L (ref 0.5–1.9)

## 2023-04-16 LAB — CULTURE, BLOOD (ROUTINE X 2)

## 2023-04-16 LAB — RPR: RPR Ser Ql: NONREACTIVE

## 2023-04-16 MED ORDER — QUETIAPINE FUMARATE 25 MG PO TABS
25.0000 mg | ORAL_TABLET | Freq: Once | ORAL | Status: AC
  Administered 2023-04-16: 25 mg via ORAL
  Filled 2023-04-16: qty 1

## 2023-04-16 MED ORDER — LACTATED RINGERS IV SOLN: INTRAVENOUS | Status: AC

## 2023-04-16 MED ORDER — ALBUTEROL SULFATE HFA 108 (90 BASE) MCG/ACT IN AERS: 1.0000 | INHALATION_SPRAY | Freq: Four times a day (QID) | RESPIRATORY_TRACT | Status: DC | PRN

## 2023-04-16 MED ORDER — MAGNESIUM SULFATE 4 GM/100ML IV SOLN
4.0000 g | Freq: Once | INTRAVENOUS | Status: AC
  Administered 2023-04-16: 4 g via INTRAVENOUS
  Filled 2023-04-16 (×2): qty 100

## 2023-04-16 MED ORDER — CLOPIDOGREL BISULFATE 75 MG PO TABS
75.0000 mg | ORAL_TABLET | Freq: Every evening | ORAL | Status: DC
  Administered 2023-04-16 – 2023-04-19 (×4): 75 mg via ORAL
  Filled 2023-04-16 (×4): qty 1

## 2023-04-16 MED ORDER — VENLAFAXINE HCL ER 75 MG PO CP24
75.0000 mg | ORAL_CAPSULE | Freq: Every day | ORAL | Status: DC
  Administered 2023-04-16 – 2023-04-20 (×5): 75 mg via ORAL
  Filled 2023-04-16 (×5): qty 1

## 2023-04-16 MED ORDER — ENOXAPARIN SODIUM 30 MG/0.3ML IJ SOSY
30.0000 mg | PREFILLED_SYRINGE | INTRAMUSCULAR | Status: DC
  Administered 2023-04-17 – 2023-04-19 (×3): 30 mg via SUBCUTANEOUS
  Filled 2023-04-16 (×3): qty 0.3

## 2023-04-16 MED ORDER — ALBUTEROL SULFATE (2.5 MG/3ML) 0.083% IN NEBU: 2.5000 mg | INHALATION_SOLUTION | Freq: Four times a day (QID) | RESPIRATORY_TRACT | Status: DC | PRN

## 2023-04-16 MED ORDER — CLONIDINE HCL 0.1 MG PO TABS
0.1000 mg | ORAL_TABLET | Freq: Every day | ORAL | Status: DC
  Administered 2023-04-16 – 2023-04-17 (×3): 0.1 mg via ORAL
  Filled 2023-04-16 (×3): qty 1

## 2023-04-16 MED ORDER — METOPROLOL SUCCINATE ER 100 MG PO TB24
100.0000 mg | ORAL_TABLET | Freq: Every day | ORAL | Status: DC
  Administered 2023-04-16 – 2023-04-20 (×5): 100 mg via ORAL
  Filled 2023-04-16 (×5): qty 1

## 2023-04-16 MED ORDER — HYDROCODONE-ACETAMINOPHEN 10-325 MG PO TABS
1.0000 | ORAL_TABLET | Freq: Four times a day (QID) | ORAL | Status: DC | PRN
  Administered 2023-04-16: 1 via ORAL
  Filled 2023-04-16: qty 1

## 2023-04-16 MED ORDER — LACTATED RINGERS IV SOLN: INTRAVENOUS | Status: DC

## 2023-04-16 MED ORDER — HALOPERIDOL LACTATE 5 MG/ML IJ SOLN: 2.0000 mg | Freq: Once | INTRAMUSCULAR | Status: DC

## 2023-04-16 MED ORDER — PANTOPRAZOLE SODIUM 40 MG PO TBEC
40.0000 mg | DELAYED_RELEASE_TABLET | Freq: Two times a day (BID) | ORAL | Status: DC
  Administered 2023-04-16 – 2023-04-20 (×9): 40 mg via ORAL
  Filled 2023-04-16 (×9): qty 1

## 2023-04-16 MED ORDER — AMLODIPINE BESYLATE 10 MG PO TABS
10.0000 mg | ORAL_TABLET | Freq: Every day | ORAL | Status: DC
  Administered 2023-04-16 – 2023-04-20 (×5): 10 mg via ORAL
  Filled 2023-04-16 (×5): qty 1

## 2023-04-16 NOTE — Progress Notes (Signed)
PROGRESS NOTE                                                                                                                                                                                                             Patient Demographics:    Logynn Peshek, is a 77 y.o. female, DOB - 25-Feb-1946, AVW:[id number]  Outpatient Primary MD for the patient is Garlan Fillers, MD    LOS - 1  Admit date - 04/15/2023    Chief Complaint  Patient presents with   Altered Mental Status       Brief Narrative (HPI from H&P)   77 y.o. female with medical history significant of living by herself.  Per report patient is typically alert awake and fully oriented.  Unfortunately per report, patient was not heard from in the last 48 hours, a wellness check was performed and the patient was found in her home on the ground covered in urine and feces.  Patient is brought to [facility] ER.  In the ER workup was consistent with extreme dehydration, rhabdomyolysis, AKI, UTI and possible pneumonia and she was admitted.   Subjective:    Francia Greaves today has, No headache, No chest pain, No abdominal pain - No Nausea, No new weakness tingling or numbness, no cough or shortness of breath.   Assessment  & Plan :    Acute metabolic encephalopathy, fall, rhabdomyolysis, dehydration, AKI. She was found down for unclear number of hours, last seen normal about 2 days prior to the episode, CT head and C-spine nonacute, she certainly has severe dehydration, rhabdo and AKI, continue IV fluids, antibiotics for UTI and pneumonia.  Advance activity with PT OT, monitor.  EEG is pending, if needed we will pursue MRI brain.  Mentation is improving.  Pneumonia.  Aspiration likely, UTI.  Continue on present empiric antibiotics, responding well, follow cultures.  Speech to evaluate.   Rhabdomyolysis - see #1  Acute kidney injury superimposed on CKD 4 with baseline creatinine  of close to 2- continue hydration with IV fluids, agree with sodium bicarb.  Monitor bladder scans, check renal ultrasound.  Asymptomatic transaminitis, fatty liver - transaminitis due to rhabdo, ultrasound suggest fatty liver, trend LFTs and outpatient GI follow-up.  Chronic pancreatitis (HCC)  - C.w. pancreatic enzy,es  Accelerated hypertension  - Is a chronic diagnosis, home dose  beta-blocker continued, added Norvasc and as needed hydralazine.  Hypomagnesemia.  Replace.       Condition - Fair  Family Communication  :    Called son Fayrene Fearing [phone]  on 04/16/2023 at 7:40 AM and message left  Called daughter Toniann Fail [phone] on 04/16/2023 and updated  Code Status :  Full  Consults  :  None  PUD Prophylaxis :  PPI   Procedures  :     CT head and C-spine.  Nonacute.  Right upper quadrant ultrasound.  Fatty liver  Renal US  EEG      Disposition Plan  :    Status is: Inpatient  DVT Prophylaxis  :    enoxaparin (LOVENOX) injection 40 mg Start: 04/16/23 1000 SCD's Start: 04/15/23 2102  Lab Results  Component Value Date   PLT 150 04/16/2023    Diet :  Diet Order             DIET SOFT Fluid consistency: Thin  Diet effective now                    Inpatient Medications  Scheduled Meds:   stroke: early stages of recovery book   Does not apply Once   aspirin  81 mg Oral Daily   atorvastatin  40 mg Oral Daily   cloNIDine  0.1 mg Oral QHS   clopidogrel  75 mg Oral QPM   enoxaparin (LOVENOX) injection  40 mg Subcutaneous Q24H   insulin aspart  0-5 Units Subcutaneous QHS   insulin aspart  0-9 Units Subcutaneous TID WC   isosorbide mononitrate  60 mg Oral Daily   levETIRAcetam  500 mg Oral BID   lipase/protease/amylase  36,000 Units Oral BID WC   metoprolol succinate  100 mg Oral Daily   montelukast  10 mg Oral Daily   pantoprazole  40 mg Oral BID   sodium bicarbonate  650 mg Oral QID   venlafaxine XR  75 mg Oral Daily   Continuous Infusions:   sodium chloride Stopped (04/15/23 2156)   azithromycin     cefTRIAXone (ROCEPHIN)  IV     lactated ringers     thiamine (VITAMIN B1) injection Stopped (04/15/23 2241)   PRN Meds:.acetaminophen **OR** acetaminophen (TYLENOL) oral liquid 160 mg/5 mL **OR** acetaminophen, albuterol, hydrALAZINE, labetalol, LORazepam, senna-docusate    Objective:   Vitals:   04/15/23 2212 04/15/23 2221 04/16/23 0000 04/16/23 0010  BP: (!) 102/90 131/74  (!) 182/52  Pulse: (!) 104  (!) 122   Resp: (!) 25 17  19   Temp:    98.5 F (36.9 C)  TempSrc:    Oral  SpO2: 92%  (!) 86% 94%  Weight:      Height:        Wt Readings from Last 3 Encounters:  04/15/23 68 kg  03/31/23 75.8 kg  02/18/23 78.1 kg     Intake/Output Summary (Last 24 hours) at 04/16/2023 0740 Last data filed at 04/16/2023 0118 Gross per 24 hour  Intake 50 ml  Output 1 ml  Net 49 ml     Physical Exam  Awake, mildly confused oriented x 2, no new F.N deficits, Normal affect [place].AT,PERRAL Supple Neck, No JVD,   Symmetrical Chest wall movement, Good air movement bilaterally, CTAB RRR,No Gallops,Rubs or new Murmurs,  +ve B.Sounds, Abd Soft, No tenderness,   No Cyanosis, Clubbing or edema       Data Review:    Recent Labs  Lab 04/15/23 1730 04/15/23  2159 04/16/23 0546  WBC 19.5* 16.8* 15.2*  HGB 11.7* 10.1* 10.5*  HCT 36.5 31.4* 31.3*  PLT 205 169 150  MCV 93.1 93.5 93.2  MCH 29.8 30.1 31.3  MCHC 32.1 32.2 33.5  RDW 13.5 13.4 13.5  LYMPHSABS 0.4*  --   --   MONOABS 0.4  --   --   EOSABS 0.0  --   --   BASOSABS 0.1  --   --     Recent Labs  Lab 04/15/23 1730 04/15/23 1808 04/15/23 2000 04/15/23 2159 04/15/23 2328 04/16/23 0335 04/16/23 0546  NA 135  --   --   --   --   --  134*  K 3.8  --   --   --   --   --  3.7  CL 105  --   --   --   --   --  108  CO2 14*  --   --   --   --   --  15*  ANIONGAP 16*  --   --   --   --   --  11  GLUCOSE 169*  --   --   --   --   --  108*  BUN 56*  --   --   --   --   --   51*  CREATININE 4.00*  --   --  3.59*  --   --  3.24*  AST 171*  --   --   --   --  111*  --   ALT 69*  --   --   --   --  54*  --   ALKPHOS 101  --   --   --   --  92  --   BILITOT 0.9  --   --   --   --  0.6  --   ALBUMIN 3.0*  --   --   --   --  2.4*  --   CRP  --   --   --   --   --   --  22.5*  PROCALCITON  --   --   --   --   --   --  12.10  LATICACIDVEN  --  2.8* 2.7* 1.8 2.3*  --   --   INR  --   --   --   --   --  1.3*  --   TSH  --   --   --  1.013  --   --  0.803  AMMONIA  --   --   --   --   --   --  16  BNP  --   --   --   --   --   --  505.6*  MG  --   --   --   --   --   --  1.1*  CALCIUM 9.3  --   --   --   --   --  8.2*      Recent Labs  Lab 04/15/23 1730 04/15/23 1808 04/15/23 2000 04/15/23 2159 04/15/23 2328 04/16/23 0335 04/16/23 0546  CRP  --   --   --   --   --   --  22.5*  PROCALCITON  --   --   --   --   --   --  12.10  LATICACIDVEN  --  2.8* 2.7* 1.8 2.3*  --   --  INR  --   --   --   --   --  1.3*  --   TSH  --   --   --  1.013  --   --  0.803  AMMONIA  --   --   --   --   --   --  16  BNP  --   --   --   --   --   --  505.6*  MG  --   --   --   --   --   --  1.1*  CALCIUM 9.3  --   --   --   --   --  8.2*   Lab Results  Component Value Date   CHOL 104 04/16/2023   HDL 30 (L) 04/16/2023   LDLCALC 46 04/16/2023   TRIG 138 04/16/2023   CHOLHDL 3.5 04/16/2023    Lab Results  Component Value Date   HGBA1C 6.5 (H) 01/02/2023     Radiology Reports DG Chest Port 1 View  Result Date: 04/16/2023 CLINICAL DATA:  Shortness of breath. EXAM: PORTABLE CHEST 1 VIEW COMPARISON:  04/15/2023 FINDINGS: Stable cardiomediastinal contours. Aortic atherosclerotic calcifications. Decreased lung volumes. Increase interstitial and airspace opacities within the right lung compared with the previous exam. Left lung appears clear. Age indeterminate deformity involving the greater tuberosity of the left humeral head which appears new compared with 12/25/2021.  IMPRESSION: 1. Increased interstitial and airspace opacities within the right lung compared with the previous exam. 2. Age indeterminate deformity involving the greater tuberosity of the left humeral head which appears new compared with 12/25/2021. Correlate for any clinical signs or symptoms of pain within this area. If the patient is symptomatic consider further evaluation with dedicated radiographs of the left shoulder. Electronically Signed   By: Signa Kell M.D.   On: 04/16/2023 07:01   CT Head Wo Contrast  Result Date: 04/15/2023 CLINICAL DATA:  Head and neck trauma EXAM: CT HEAD WITHOUT CONTRAST CT CERVICAL SPINE WITHOUT CONTRAST TECHNIQUE: Multidetector CT imaging of the head and cervical spine was performed following the standard protocol without intravenous contrast. Multiplanar CT image reconstructions of the cervical spine were also generated. RADIATION DOSE REDUCTION: This exam was performed according to the departmental dose-optimization program which includes automated exposure control, adjustment of the mA and/or kV according to patient size and/or use of iterative reconstruction technique. COMPARISON:  01/02/2023 CT head, 04/03/2022 CTA neck, no prior CT cervical spine available FINDINGS: CT HEAD FINDINGS Evaluation is somewhat limited by motion artifact. Brain: No evidence of acute infarct, hemorrhage, mass, mass effect, or midline shift. No hydrocephalus or extra-axial fluid collection. Periventricular white matter changes, likely the sequela of chronic small vessel ischemic disease. Vascular: No hyperdense vessel. Atherosclerotic calcifications in the intracranial carotid and vertebral arteries. Skull: Negative for fracture or focal lesion. Sinuses/Orbits: No acute finding. CT CERVICAL SPINE FINDINGS Alignment: No traumatic listhesis. Skull base and vertebrae: No acute fracture or suspicious osseous lesion. Soft tissues and spinal canal: No prevertebral fluid or swelling. No visible canal  hematoma. Disc levels: Degenerative changes in the cervical spine. Moderate spinal canal stenosis at C5-C6 and mild spinal canal stenosis at C6-C7. Upper chest: Ground-glass opacities in the imaged right lung (series 5, image 76). No pleural effusion. Aortic atherosclerosis. IMPRESSION: 1. No acute intracranial process. 2. No acute fracture or traumatic listhesis in the cervical spine. 3. Ground-glass opacities in the imaged right lung, which could be infectious or inflammatory. 4. Aortic  atherosclerosis. Aortic Atherosclerosis (ICD10-I70.0). Electronically Signed   By: Wiliam Ke M.D.   On: 04/15/2023 19:59   CT Cervical Spine Wo Contrast  Result Date: 04/15/2023 CLINICAL DATA:  Head and neck trauma EXAM: CT HEAD WITHOUT CONTRAST CT CERVICAL SPINE WITHOUT CONTRAST TECHNIQUE: Multidetector CT imaging of the head and cervical spine was performed following the standard protocol without intravenous contrast. Multiplanar CT image reconstructions of the cervical spine were also generated. RADIATION DOSE REDUCTION: This exam was performed according to the departmental dose-optimization program which includes automated exposure control, adjustment of the mA and/or kV according to patient size and/or use of iterative reconstruction technique. COMPARISON:  01/02/2023 CT head, 04/03/2022 CTA neck, no prior CT cervical spine available FINDINGS: CT HEAD FINDINGS Evaluation is somewhat limited by motion artifact. Brain: No evidence of acute infarct, hemorrhage, mass, mass effect, or midline shift. No hydrocephalus or extra-axial fluid collection. Periventricular white matter changes, likely the sequela of chronic small vessel ischemic disease. Vascular: No hyperdense vessel. Atherosclerotic calcifications in the intracranial carotid and vertebral arteries. Skull: Negative for fracture or focal lesion. Sinuses/Orbits: No acute finding. CT CERVICAL SPINE FINDINGS Alignment: No traumatic listhesis. Skull base and vertebrae:  No acute fracture or suspicious osseous lesion. Soft tissues and spinal canal: No prevertebral fluid or swelling. No visible canal hematoma. Disc levels: Degenerative changes in the cervical spine. Moderate spinal canal stenosis at C5-C6 and mild spinal canal stenosis at C6-C7. Upper chest: Ground-glass opacities in the imaged right lung (series 5, image 76). No pleural effusion. Aortic atherosclerosis. IMPRESSION: 1. No acute intracranial process. 2. No acute fracture or traumatic listhesis in the cervical spine. 3. Ground-glass opacities in the imaged right lung, which could be infectious or inflammatory. 4. Aortic atherosclerosis. Aortic Atherosclerosis (ICD10-I70.0). Electronically Signed   By: Wiliam Ke M.D.   On: 04/15/2023 19:59   US Abdomen Limited RUQ (LIVER/GB)  Result Date: 04/15/2023 CLINICAL DATA:  Elevated liver enzyme EXAM: ULTRASOUND ABDOMEN LIMITED RIGHT UPPER QUADRANT COMPARISON:  09/12/2016 right upper quadrant ultrasound FINDINGS: Gallbladder: No gallstones or wall thickening visualized. No sonographic Murphy sign noted by sonographer. Common bile duct: Diameter: 2 mm, within normal limits. No intrahepatic biliary ductal dilatation Liver: No focal lesion identified. Increased parenchymal echogenicity. Portal vein is patent on color Doppler imaging with normal direction of blood flow towards the liver. Other: No left lateral decubitus images could be obtained, as the patient asked to terminate the exam. IMPRESSION: Hepatic steatosis. Electronically Signed   By: Wiliam Ke M.D.   On: 04/15/2023 19:48   DG Hip Unilat W or Wo Pelvis 2-3 Views Right  Result Date: 04/15/2023 CLINICAL DATA:  Recent fall with right hip pain, initial EXAM: DG HIP (WITH OR WITHOUT PELVIS) 3V RIGHT COMPARISON:  None Available. FINDINGS: No acute fracture or dislocation is noted. Vascular calcifications are seen. Mild degenerative changes of the hip joints are noted. No soft tissue abnormality is seen.  IMPRESSION: No acute abnormality noted. Electronically Signed   By: Alcide Clever M.D.   On: 04/15/2023 19:18   DG Chest Portable 1 View  Result Date: 04/15/2023 CLINICAL DATA:  Pain after fall EXAM: PORTABLE CHEST 1 VIEW COMPARISON:  Chest x-ray 01/02/2023 FINDINGS: No pneumothorax, effusion or edema. Normal cardiopericardial silhouette. Calcified aorta. Slight ill-defined hazy opacity at the right lung base. A subtle infiltrates possible. Recommend follow-up. Overlapping cardiac leads. IMPRESSION: Slight ill-defined hazy opacity in the right lung base, possible subtle infiltrate. Recommend follow-up Electronically Signed   By: Piedad Climes.D.  On: 04/15/2023 18:08      Signature  -   Susa Raring M.D on 04/16/2023 at 7:40 AM   -  To page go to [email or web address]

## 2023-04-16 NOTE — Progress Notes (Signed)
EEG complete - results pending 

## 2023-04-16 NOTE — Progress Notes (Signed)
OT Cancellation Note  Patient Details Name: [name] MRN: [id number] DOB: [birth year]   Cancelled Treatment:    Reason Eval/Treat Not Completed: Patient at procedure or test/ unavailable (Per RN, pt has multiple procedures scheduled for today and RN requests holding acute skilled OT eval at this time. OT to reattempt skilled OT eval at a later time as appropriate/available.)  [name] "Orson Eva., OTR/L, MA Acute Rehab [phone]   [name] 04/16/2023, 11:14 AM

## 2023-04-16 NOTE — Progress Notes (Signed)
PHARMACY - PHYSICIAN COMMUNICATION CRITICAL VALUE ALERT - BLOOD CULTURE IDENTIFICATION (BCID)  [name] is an 77 y.o. female who presented to [facility] on [facility]/15/2023 with a chief complaint of being found down at home.   Assessment:  77 year old female admitted after being found down at home. Currently being treated for rhabdomyolysis and pneumonia. Blood cultures from last night returned with 1/6 bottles growing a staph species and a strep species. This is likely contamination.   Name of physician (or Provider) Contacted: Thedore Mins  Current antibiotics: Ceftriaxone/azithromycin  Changes to prescribed antibiotics recommended:  No changes needed based on this culture.   Results for orders placed or performed during the hospital encounter of 04/15/23  Blood Culture ID Panel (Reflexed) (Collected: 04/15/2023  8:00 PM)  Result Value Ref Range   Enterococcus faecalis NOT DETECTED NOT DETECTED   Enterococcus Faecium NOT DETECTED NOT DETECTED   Listeria monocytogenes NOT DETECTED NOT DETECTED   Staphylococcus species DETECTED (A) NOT DETECTED   Staphylococcus aureus (BCID) NOT DETECTED NOT DETECTED   Staphylococcus epidermidis NOT DETECTED NOT DETECTED   Staphylococcus lugdunensis NOT DETECTED NOT DETECTED   Streptococcus species DETECTED (A) NOT DETECTED   Streptococcus agalactiae NOT DETECTED NOT DETECTED   Streptococcus pneumoniae NOT DETECTED NOT DETECTED   Streptococcus pyogenes NOT DETECTED NOT DETECTED   A.calcoaceticus-baumannii NOT DETECTED NOT DETECTED   Bacteroides fragilis NOT DETECTED NOT DETECTED   Enterobacterales NOT DETECTED NOT DETECTED   Enterobacter cloacae complex NOT DETECTED NOT DETECTED   Escherichia coli NOT DETECTED NOT DETECTED   Klebsiella aerogenes NOT DETECTED NOT DETECTED   Klebsiella oxytoca NOT DETECTED NOT DETECTED   Klebsiella pneumoniae NOT DETECTED NOT DETECTED   Proteus species NOT DETECTED NOT DETECTED   Salmonella species NOT DETECTED NOT DETECTED    Serratia marcescens NOT DETECTED NOT DETECTED   Haemophilus influenzae NOT DETECTED NOT DETECTED   Neisseria meningitidis NOT DETECTED NOT DETECTED   Pseudomonas aeruginosa NOT DETECTED NOT DETECTED   Stenotrophomonas maltophilia NOT DETECTED NOT DETECTED   Candida albicans NOT DETECTED NOT DETECTED   Candida auris NOT DETECTED NOT DETECTED   Candida glabrata NOT DETECTED NOT DETECTED   Candida krusei NOT DETECTED NOT DETECTED   Candida parapsilosis NOT DETECTED NOT DETECTED   Candida tropicalis NOT DETECTED NOT DETECTED   Cryptococcus neoformans/gattii NOT DETECTED NOT DETECTED    Sharin Mons, PharmD, BCPS, BCIDP Infectious Diseases Clinical Pharmacist Phone: [phone] 04/16/2023  12:00 PM

## 2023-04-16 NOTE — Progress Notes (Signed)
PT Cancellation Note  Patient Details Name: [name] MRN: [id number] DOB: Mar 06, 1946   Cancelled Treatment:    Reason Eval/Treat Not Completed: Patient at procedure or test/unavailable (PT to continue with attempts as patient available.)  Donna Bernard, PT, MPT  Ina Homes 04/16/2023, 9:56 AM

## 2023-04-16 NOTE — Procedures (Signed)
Patient Name: [name]  MRN: [id number]  Epilepsy Attending: Charlsie Quest  Referring Physician/Provider: Nolberto Hanlon, MD  Date: 04/16/2023 Duration: 23.30 mins  Patient history: 76yo f with ams getting eeg to evaluate for seizure.  Level of alertness: Awake  AEDs during EEG study: LEV  Technical aspects: This EEG study was done with scalp electrodes positioned according to the 10-20 International system of electrode placement. Electrical activity was reviewed with band pass filter of 1-70Hz , sensitivity of 7 uV/mm, display speed of 23mm/sec with a 60Hz  notched filter applied as appropriate. EEG data were recorded continuously and digitally stored.  Video monitoring was available and reviewed as appropriate.  Description: The posterior dominant rhythm consists of 7.5 Hz activity of moderate voltage (25-35 uV) seen predominantly in posterior head regions, symmetric and reactive to eye opening and eye closing. Hyperventilation and photic stimulation were not performed.     IMPRESSION: This study is within normal limits. No seizures or epileptiform discharges were seen throughout the recording.  A normal interictal EEG does not exclude the diagnosis of epilepsy.  [name] [name]

## 2023-04-17 DIAGNOSIS — R410 Disorientation, unspecified: Secondary | ICD-10-CM | POA: Diagnosis not present

## 2023-04-17 LAB — GLUCOSE, CAPILLARY
Glucose-Capillary: 97 mg/dL (ref 70–99)
Glucose-Capillary: 120 mg/dL — ABNORMAL HIGH (ref 70–99)
Glucose-Capillary: 113 mg/dL — ABNORMAL HIGH (ref 70–99)
Glucose-Capillary: 96 mg/dL (ref 70–99)

## 2023-04-17 LAB — COMPREHENSIVE METABOLIC PANEL
ALT: 42 U/L (ref 0–44)
AST: 63 U/L — ABNORMAL HIGH (ref 15–41)
Albumin: 2.1 g/dL — ABNORMAL LOW (ref 3.5–5.0)
Alkaline Phosphatase: 84 U/L (ref 38–126)
Anion gap: 9 (ref 5–15)
BUN: 54 mg/dL — ABNORMAL HIGH (ref 8–23)
CO2: 17 mmol/L — ABNORMAL LOW (ref 22–32)
Calcium: 8.3 mg/dL — ABNORMAL LOW (ref 8.9–10.3)
Chloride: 108 mmol/L (ref 98–111)
Creatinine, Ser: 3.02 mg/dL — ABNORMAL HIGH (ref 0.44–1.00)
GFR, Estimated: 16 mL/min — ABNORMAL LOW (ref >60)
Glucose, Bld: 98 mg/dL (ref 70–99)
Potassium: 3.8 mmol/L (ref 3.5–5.1)
Sodium: 134 mmol/L — ABNORMAL LOW (ref 135–145)
Total Bilirubin: 0.4 mg/dL (ref 0.3–1.2)
Total Protein: 4.9 g/dL — ABNORMAL LOW (ref 6.5–8.1)

## 2023-04-17 LAB — PROCALCITONIN: Procalcitonin: 6.1 ng/mL

## 2023-04-17 LAB — CBC WITH DIFFERENTIAL/PLATELET
Abs Immature Granulocytes: 0.03 10*3/uL (ref 0.00–0.07)
Basophils Absolute: 0 10*3/uL (ref 0.0–0.1)
Basophils Relative: 0 %
Eosinophils Absolute: 0.5 10*3/uL (ref 0.0–0.5)
Eosinophils Relative: 5 %
HCT: 27.1 % — ABNORMAL LOW (ref 36.0–46.0)
Hemoglobin: 8.6 g/dL — ABNORMAL LOW (ref 12.0–15.0)
Immature Granulocytes: 0 %
Lymphocytes Relative: 10 %
Lymphs Abs: 0.9 10*3/uL (ref 0.7–4.0)
MCH: 29.4 pg (ref 26.0–34.0)
MCHC: 31.7 g/dL (ref 30.0–36.0)
MCV: 92.5 fL (ref 80.0–100.0)
Monocytes Absolute: 0.5 10*3/uL (ref 0.1–1.0)
Monocytes Relative: 6 %
Neutro Abs: 6.9 10*3/uL (ref 1.7–7.7)
Neutrophils Relative %: 79 %
Platelets: 166 10*3/uL (ref 150–400)
RBC: 2.93 MIL/uL — ABNORMAL LOW (ref 3.87–5.11)
RDW: 13.8 % (ref 11.5–15.5)
WBC: 8.9 10*3/uL (ref 4.0–10.5)
nRBC: 0 % (ref 0.0–0.2)

## 2023-04-17 LAB — BRAIN NATRIURETIC PEPTIDE: B Natriuretic Peptide: 267.3 pg/mL — ABNORMAL HIGH (ref 0.0–100.0)

## 2023-04-17 LAB — C-REACTIVE PROTEIN: CRP: 20.9 mg/dL — ABNORMAL HIGH (ref <1.0)

## 2023-04-17 LAB — LEVETIRACETAM LEVEL: Levetiracetam Lvl: 25.8 ug/mL (ref 10.0–40.0)

## 2023-04-17 LAB — MAGNESIUM: Magnesium: 2.4 mg/dL (ref 1.7–2.4)

## 2023-04-17 MED ORDER — LOPERAMIDE HCL 2 MG PO CAPS
2.0000 mg | ORAL_CAPSULE | Freq: Four times a day (QID) | ORAL | Status: DC | PRN
  Administered 2023-04-17 – 2023-04-18 (×2): 2 mg via ORAL
  Filled 2023-04-17 (×2): qty 1

## 2023-04-17 MED ORDER — ATORVASTATIN CALCIUM 40 MG PO TABS
40.0000 mg | ORAL_TABLET | Freq: Every day | ORAL | Status: DC
  Administered 2023-04-19 – 2023-04-20 (×2): 40 mg via ORAL
  Filled 2023-04-17 (×2): qty 1

## 2023-04-17 NOTE — Plan of Care (Signed)
  Problem: Education: Goal: Knowledge of disease or condition will improve 04/17/2023 2044 by Bonnita Levan, RN Outcome: Progressing 04/17/2023 2044 by Bonnita Levan, RN Outcome: Progressing Goal: Knowledge of secondary prevention will improve (MUST DOCUMENT ALL) Outcome: Progressing Goal: Knowledge of patient specific risk factors will improve Loraine Leriche N/A or DELETE if not current risk factor) Outcome: Progressing   Problem: Ischemic Stroke/TIA Tissue Perfusion: Goal: Complications of ischemic stroke/TIA will be minimized Outcome: Progressing   Problem: Coping: Goal: Will verbalize positive feelings about self Outcome: Progressing Goal: Will identify appropriate support needs Outcome: Progressing   Problem: Health Behavior/Discharge Planning: Goal: Ability to manage health-related needs will improve Outcome: Progressing Goal: Goals will be collaboratively established with patient/family Outcome: Progressing   Problem: Self-Care: Goal: Ability to participate in self-care as condition permits will improve Outcome: Progressing Goal: Verbalization of feelings and concerns over difficulty with self-care will improve Outcome: Progressing Goal: Ability to communicate needs accurately will improve Outcome: Progressing   Problem: Nutrition: Goal: Risk of aspiration will decrease Outcome: Progressing Goal: Dietary intake will improve Outcome: Progressing   Problem: Education: Goal: Ability to describe self-care measures that may prevent or decrease complications (Diabetes Survival Skills Education) will improve Outcome: Progressing Goal: Individualized Educational Video(s) Outcome: Progressing   Problem: Coping: Goal: Ability to adjust to condition or change in health will improve Outcome: Progressing   Problem: Fluid Volume: Goal: Ability to maintain a balanced intake and output will improve Outcome: Progressing   Problem: Health Behavior/Discharge Planning: Goal: Ability to  identify and utilize available resources and services will improve Outcome: Progressing Goal: Ability to manage health-related needs will improve Outcome: Progressing   Problem: Metabolic: Goal: Ability to maintain appropriate glucose levels will improve Outcome: Progressing   Problem: Nutritional: Goal: Maintenance of adequate nutrition will improve Outcome: Progressing Goal: Progress toward achieving an optimal weight will improve Outcome: Progressing   Problem: Skin Integrity: Goal: Risk for impaired skin integrity will decrease Outcome: Progressing   Problem: Tissue Perfusion: Goal: Adequacy of tissue perfusion will improve Outcome: Progressing   Problem: Education: Goal: Knowledge of General Education information will improve Description: Including pain rating scale, medication(s)/side effects and non-pharmacologic comfort measures Outcome: Progressing   Problem: Health Behavior/Discharge Planning: Goal: Ability to manage health-related needs will improve Outcome: Progressing   Problem: Clinical Measurements: Goal: Ability to maintain clinical measurements within normal limits will improve Outcome: Progressing Goal: Will remain free from infection Outcome: Progressing Goal: Diagnostic test results will improve Outcome: Progressing Goal: Respiratory complications will improve Outcome: Progressing Goal: Cardiovascular complication will be avoided Outcome: Progressing   Problem: Activity: Goal: Risk for activity intolerance will decrease Outcome: Progressing   Problem: Nutrition: Goal: Adequate nutrition will be maintained Outcome: Progressing   Problem: Coping: Goal: Level of anxiety will decrease Outcome: Progressing   Problem: Elimination: Goal: Will not experience complications related to bowel motility Outcome: Progressing Goal: Will not experience complications related to urinary retention Outcome: Progressing   Problem: Pain Managment: Goal:  General experience of comfort will improve Outcome: Progressing   Problem: Safety: Goal: Ability to remain free from injury will improve Outcome: Progressing   Problem: Skin Integrity: Goal: Risk for impaired skin integrity will decrease Outcome: Progressing

## 2023-04-17 NOTE — Evaluation (Signed)
Occupational Therapy Evaluation Patient Details Name: DESHAUN BIVIANO MRN: [id number] DOB: [birth year] Today's Date: 04/17/2023   History of Present Illness Pt is a 77 yo female admitted on 04/15/23 with fall , unknown time on floor, covered in feces and urine.  She was found to have extreme dehydration, rhabdomyolysis, AKI, encephalopathy, UTI and possible PNE.  Pt with hx including but not limited to CAD, diabetes, hypertension, previous stroke, and peripheral arterial disease   Clinical Impression   Pt is typically independent and lives alone with her family living nearby. She owns a RW and shower seat. Pt drives. Presents with generalized weakness and impaired standing balance requiring RW and min guard assist for ambulation. She needs set up to min assist for ADLs. Pt likely to progress well, recommending HHOT.      Recommendations for follow up therapy are one component of a multi-disciplinary discharge planning process, led by the attending physician.  Recommendations may be updated based on patient status, additional functional criteria and insurance authorization.   Assistance Recommended at Discharge Frequent or constant Supervision/Assistance  Patient can return home with the following A little help with walking and/or transfers;A little help with bathing/dressing/bathroom;Assistance with cooking/housework;Assist for transportation;Help with stairs or ramp for entrance    Functional Status Assessment  Patient has had a recent decline in their functional status and demonstrates the ability to make significant improvements in function in a reasonable and predictable amount of time.  Equipment Recommendations  None recommended by OT    Recommendations for Other Services       Precautions / Restrictions Precautions Precautions: Fall Restrictions Weight Bearing Restrictions: No      Mobility Bed Mobility Overal bed mobility: Needs Assistance Bed Mobility: Supine to Sit      Supine to sit: Supervision          Transfers Overall transfer level: Needs assistance Equipment used: 1 person hand held assist Transfers: Sit to/from Stand Sit to Stand: Min assist           General transfer comment: Light min A to steady; started no RW as that is baseline      Balance Overall balance assessment: Needs assistance Sitting-balance support: No upper extremity supported Sitting balance-Leahy Scale: Good     Standing balance support: Single extremity supported, Bilateral upper extremity supported Standing balance-Leahy Scale: Poor                             ADL either performed or assessed with clinical judgement   ADL Overall ADL's : Needs assistance/impaired Eating/Feeding: Independent;Sitting;Bed level   Grooming: Standing;Min guard   Upper Body Bathing: Set up;Sitting   Lower Body Bathing: Minimal assistance;Sitting/lateral leans   Upper Body Dressing : Set up;Sitting   Lower Body Dressing: Minimal assistance;Sit to/from stand Lower Body Dressing Details (indicate cue type and reason): can don socks using figure 4 method Toilet Transfer: Min guard;Rolling walker (2 wheels);Ambulation   Toileting- Clothing Manipulation and Hygiene: Set up;Sitting/lateral lean       Functional mobility during ADLs: Min guard;Rolling walker (2 wheels)       Vision Ability to See in Adequate Light: 0 Adequate Patient Visual Report: No change from baseline       Perception     Praxis      Pertinent Vitals/Pain Pain Assessment Pain Assessment: No/denies pain     Hand Dominance Right   Extremity/Trunk Assessment Upper Extremity Assessment Upper Extremity Assessment: Overall [facility]  for tasks assessed   Lower Extremity Assessment Lower Extremity Assessment: Defer to PT evaluation   Cervical / Trunk Assessment Cervical / Trunk Assessment: Kyphotic   Communication Communication Communication: Expressive difficulties;Other (comment)  (reports slurred speech is baseline)   Cognition Arousal/Alertness: Awake/alert Behavior During Therapy: WFL for tasks assessed/performed Overall Cognitive Status: Within Functional Limits for tasks assessed                                       General Comments  VSS on RA    Exercises     Shoulder Instructions      Home Living Family/patient expects to be discharged to:: Private residence Living Arrangements: Alone Available Help at Discharge: Family;Friend(s);Available PRN/intermittently Type of Home: House Home Access: Stairs to enter [employer] of Steps: 2 Entrance Stairs-Rails: Right;Left Home Layout: One level     Bathroom Shower/Tub: Chief Strategy Officer: Standard     Home Equipment: Pharmacist, hospital (2 wheels)   Additional Comments: Son and daughter-in-law live nearby.; Reports typically sleeping on the couch      Prior Functioning/Environment Prior Level of Function : Independent/Modified Independent;Driving             Mobility Comments: Indep without DME; could ambulate in community; recently went on girls beach trip ADLs Comments: independent with adls and light iadls        OT Problem List: Impaired balance (sitting and/or standing);Decreased strength      OT Treatment/Interventions: Self-care/ADL training;DME and/or AE instruction;Therapeutic activities;Patient/family education;Balance training    OT Goals(Current goals can be found in the care plan section) Acute Rehab OT Goals OT Goal Formulation: With patient Time For Goal Achievement: 05/01/23 Potential to Achieve Goals: Good ADL Goals Pt Will Perform Grooming: with modified independence;standing Pt Will Perform Lower Body Bathing: with modified independence;sit to/from stand Pt Will Perform Lower Body Dressing: with modified independence;sit to/from stand Pt Will Transfer to Toilet: with modified independence;ambulating;regular height  toilet Pt Will Perform Toileting - Clothing Manipulation and hygiene: with modified independence;sit to/from stand Pt Will Perform Tub/Shower Transfer: Tub transfer;with supervision;shower seat;ambulating;rolling walker Additional ADL Goal #1: Pt will state at least 3 fall prevention strategies as instructed.  OT Frequency: Min 1X/week    Co-evaluation   Reason for Co-Treatment: Complexity of the patient's impairments (multi-system involvement) PT goals addressed during session: Mobility/safety with mobility;Balance        AM-PAC OT "6 Clicks" Daily Activity     Outcome Measure Help from another person eating meals?: None Help from another person taking care of personal grooming?: A Little Help from another person toileting, which includes using toliet, bedpan, or urinal?: A Little Help from another person bathing (including washing, rinsing, drying)?: A Little Help from another person to put on and taking off regular upper body clothing?: A Little Help from another person to put on and taking off regular lower body clothing?: A Little 6 Click Score: 19   End of Session Equipment Utilized During Treatment: Rolling walker (2 wheels);Gait belt Nurse Communication: Mobility status  Activity Tolerance: Patient tolerated treatment well Patient left: in chair;with call bell/phone within reach;with chair alarm set  OT Visit Diagnosis: Unsteadiness on feet (R26.81);Other abnormalities of gait and mobility (R26.89);Muscle weakness (generalized) (M62.81)                Time: 1308-6578 OT Time Calculation (min): 25 min Charges:  OT Evaluation $  OT Eval Moderate Complexity: 1 Mod  Berna Spare, OTR/L Acute Rehabilitation Services Office: [phone]  Evern Bio 04/17/2023, 11:15 AM

## 2023-04-17 NOTE — Evaluation (Signed)
Speech Language Pathology Evaluation Patient Details Name: [name] MRN: [id number] DOB: 09-11-1946 Today's Date: 04/17/2023 Time: 6962-9528 SLP Time Calculation (min) (ACUTE ONLY): 31 min  Problem List:  Patient Active Problem List   Diagnosis Date Noted   Confusion 04/15/2023   Rhabdomyolysis 04/15/2023   Unstable angina (HCC) 01/29/2023   TIA (transient ischemic attack) 01/02/2023   Acute CVA (cerebrovascular accident) (HCC) 04/15/2022   Hypertensive urgency 04/09/2022   History of seizures as a child 04/09/2022   Chronic combined systolic and diastolic CHF (congestive heart failure) (HCC) 04/09/2022   Pyuria 04/09/2022   CKD (chronic kidney disease) stage 4, GFR 15-29 ml/min (HCC) 04/09/2022   Sepsis (HCC) 12/26/2021   UTI (urinary tract infection) 12/26/2021   Aspiration pneumonia (HCC) 12/26/2021   Acute renal failure superimposed on stage 4 chronic kidney disease (HCC) 12/26/2021   Acute on chronic systolic CHF (congestive heart failure) (HCC) 12/26/2021   Stroke (HCC) 12/25/2021   Pharyngeal dysphagia 07/12/2021   Pressure ulcer of sacral region 07/12/2021   Wound of abdomen 07/12/2021   Closed fracture of distal end of left radius 06/15/2021   Degeneration of lumbar intervertebral disc 04/23/2021   Hyponatremia 11/16/2020   Metabolic acidosis, NAG, failure of bicarbonate regeneration 11/16/2020   Nausea & vomiting 11/16/2020   Acute-on-chronic kidney injury (HCC) 11/16/2020   Abdominal aortic atherosclerosis (HCC) 06/01/2020   Acquired thrombophilia (HCC) 06/01/2020   Insomnia 05/08/2020   Neck pain 01/11/2020   Referred otalgia of both ears 01/11/2020   Frequency of micturition 12/16/2019   Orthostatic hypotension 12/16/2019   Urinary tract infectious disease 12/16/2019   Pneumonia due to coronavirus disease 2019 11/17/2019   Acute combined systolic and diastolic heart failure (HCC) 10/29/2019   COVID-19 10/29/2019   Hyperkalemia 10/16/2019   CAP  (community acquired pneumonia) 10/16/2019   Acute metabolic encephalopathy 10/15/2019   Noninfective gastroenteritis and colitis, unspecified 09/01/2019   Hypo-osmolality and hyponatremia 08/04/2019   Spasm 06/03/2019   Seizures (HCC) 01/07/2019   Cerebrovascular accident (CVA) due to occlusion of left carotid artery (HCC) 01/07/2019   CAD, multiple vessel 01/07/2019   H/O ischemic bowel disease 01/07/2019   Protein calorie malnutrition (HCC) 01/04/2019   Acute bronchitis 12/04/2018   Oral phase dysphagia 12/04/2018   Personal history of transient ischemic attack (TIA), and cerebral infarction without residual deficits 11/11/2018   CKD (chronic kidney disease) stage 3, GFR 30-59 ml/min 11/04/2018   Seizure (HCC) 11/03/2018   Anorectal disorder 06/08/2018   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  Ileostomy 10/18/2017 10/14/2017   Ischemic colitis (HCC) 10/14/2017   Acute respiratory failure with hypoxemia (HCC) 10/14/2017   Acute post-operative pain 10/14/2017   Acute kidney injury superimposed on CKD (HCC) 10/14/2017   Anemia 10/14/2017   Benign paroxysmal positional vertigo 07/29/2017   Nocturia more than twice per night 05/07/2017   Diabetes due to underlying condition w oth circulatory comp (HCC) 05/07/2017   PVD (peripheral vascular disease) (HCC) 05/07/2017   Poor sleep hygiene 05/07/2017   Paradoxical insomnia 05/07/2017   Abnormal feces 04/10/2017   Hilar lymphadenopathy    Hemoptysis 02/19/2017   Pulmonary nodules/lesions, multiple 02/19/2017   Tobacco use 02/19/2017   Disorder  of arteries and arterioles, unspecified (HCC) 01/31/2017   Lung field  abnormal 01/31/2017   RUQ abdominal pain 01/13/2017   Restless legs syndrome 11/28/2016   Left-sided extracranial carotid artery occlusion 11/26/2016   PAOD (peripheral arterial occlusive disease) (HCC) 11/26/2016   Encounter for routine follow-up after surgery of the circulatory system 11/26/2016   Abnormal weight loss 01/29/2016   Allergic rhinitis 01/29/2016   Chest pain with moderate risk of acute coronary syndrome 10/27/2015   Other slipping, tripping and stumbling without falling, initial encounter 07/28/2015   Acid indigestion 12/31/2013   Pain in left hip 08/26/2013   Screening for gout 05/13/2013   Disorder of skin or subcutaneous tissue 12/22/2012   Difficult or painful urination 09/02/2012   Peripheral blood vessel disorder (HCC) 05/11/2012   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   Follow-up examination, following unspecified surgery 05/11/2012   Occlusion and stenosis of carotid artery without mention of cerebral infarction 04/14/2012   Fatigue 09/24/2011   Gonalgia 07/04/2011   Avitaminosis D 05/02/2011   Chronic pancreatitis (HCC) 04/30/2011   Vitamin B12 deficiency 04/30/2011   Intestinal motility disorder 04/30/2011   Fatty liver 04/30/2011   Adiposity 04/30/2011   Other and unspecified general psychiatric examination 04/26/2011   Abdominal pain 04/09/2011   Iron deficiency anemia, unspecified  04/09/2011   Other general symptoms  04/09/2011   Asthma 04/09/2011   Type 2 diabetes mellitus with diabetic peripheral angiopathy without gangrene (HCC) 01/24/2011   Abnormal weight gain 11/30/2010   Dyslipidemia    Vertigo    Right-sided extracranial carotid artery stenosis    Normal nuclear stress test    Feeling bilious 07/11/2010   Underimmunization status 07/11/2010   Pancreas (digestive gland) works poorly 05/30/2010   B12 deficiency 03/19/2010   DVT 03/16/2010   BLIND LOOP SYNDROME 03/16/2010   Diarrhea 03/16/2010   Chronic depression  02/23/2010   Major depression, single episode 02/23/2010   Cough 11/17/2009   Plantar verruca 11/17/2009   Arteriosclerosis of coronary artery 11/16/2009   Claudication (HCC) 11/16/2009   Compulsive tobacco user syndrome 11/16/2009   HLD (hyperlipidemia) 11/16/2009   Nontoxic single thyroid nodule 11/16/2009   Obstructive apnea 11/16/2009   Radial styloid tenosynovitis 11/16/2009   LBP (low back pain) 11/16/2009   Gastroparesis 11/16/2009   LAXATIVE ABUSE 12/17/2007   Accelerated hypertension 12/17/2007   Acid reflux 12/17/2007   HIATAL HERNIA 12/17/2007   IBS 12/17/2007   MELANOSIS COLI 12/17/2007   ARTHRITIS 12/17/2007   HEPATOMEGALY 12/17/2007   Past Medical History:  Past Medical History:  Diagnosis Date   Anxiety    Arthritis    back    Blind loop syndrome    Clotting disorder (HCC)    Hx Clot in leg per pt    Colostomy present (HCC)    Coronary artery disease    a. known 60-70% mid-LAD stenosis with caths in 1999, 2002, 2006, and 2012 showing stable anatomy b. 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HCC)    Diabetes mellitus    Diverticulosis    Dizziness    chronic   Dyslipidemia    Fatty liver    GERD (gastroesophageal reflux disease)    hiatial hernia   Heart murmur    Hiatal hernia    Hypertension    Irritable bowel syndrome    Pancreatitis, chronic (HCC)    Peripheral vascular disease (HCC) 02/20/2010   s/p stent of left SFA; carotid stenosis   Seizure (HCC)  11/04/2018   last 1 yr 2020 per pt unsure month    Sleep apnea    no cpap    Stroke [facility])    Thyroid nodule    Vitamin B12 deficiency    Past Surgical History:  Past Surgical History:  Procedure Laterality Date   ABDOMINAL HYSTERECTOMY     partial   AUGMENTATION MAMMAPLASTY     bilateral 1976 retro    BREAST ENHANCEMENT SURGERY     CARDIAC CATHETERIZATION  07/07/98;08/31/01;03/04/05   60 -70% LAD   CATARACT EXTRACTION Bilateral    COLON RESECTION N/A 10/14/2017    Procedure: EXPLORATORY LAPAROTOMY, EXTENDED RIGHT COLECTOMY; APPLICATION OF ABDOMINAL VACUUM DRESSING;  Surgeon: Griselda Miner, MD;  Location: [facility] ORS;  Service: General;  Laterality: N/A;   COLONOSCOPY     CORONARY ATHERECTOMY N/A 02/12/2023   Procedure: CORONARY ATHERECTOMY;  Surgeon: Swaziland, Peter M, MD;  Location: [facility] INVASIVE CV LAB;  Service: Cardiovascular;  Laterality: N/A;   CORONARY STENT INTERVENTION N/A 02/12/2023   Procedure: CORONARY STENT INTERVENTION;  Surgeon: Swaziland, Peter M, MD;  Location: [facility] INVASIVE CV LAB;  Service: Cardiovascular;  Laterality: N/A;   CORONARY ULTRASOUND/IVUS N/A 02/12/2023   Procedure: Coronary Ultrasound/IVUS;  Surgeon: Swaziland, Peter M, MD;  Location: [facility] INVASIVE CV LAB;  Service: Cardiovascular;  Laterality: N/A;   ENDOBRONCHIAL ULTRASOUND Bilateral 02/24/2017   Procedure: ENDOBRONCHIAL ULTRASOUND;  Surgeon: Leslye Peer, MD;  Location: [facility] ENDOSCOPY;  Service: Cardiopulmonary;  Laterality: Bilateral;   FEMORAL ARTERY STENT     Left leg   LAPAROTOMY N/A 10/16/2017   Procedure: EXPLORATORY LAPAROTOMY, PARTIAL OMENTECTOMY, RESECTION ISCHEMIC ILEUM 130CM, APPLICATION OF VAC ABDOMINAL DRESSING;  Surgeon: Darnell Level, MD;  Location: [facility] ORS;  Service: General;  Laterality: N/A;   LAPAROTOMY N/A 10/18/2017   Procedure: RE-EXPLORATION OF ABDOMEN, ILEOSTOMY CREATION;  Surgeon: Almond Lint, MD;  Location: [facility] ORS;  Service: General;  Laterality: N/A;   LEFT HEART CATH AND CORONARY ANGIOGRAPHY N/A 01/29/2023   Procedure: LEFT HEART CATH AND CORONARY ANGIOGRAPHY;  Surgeon: Swaziland, Peter M, MD;  Location: [facility] INVASIVE CV LAB;  Service: Cardiovascular;  Laterality: N/A;   LUNG SURGERY  03/2017   Benign polyps removed   PATELLA REALIGNMENT Left    POLYPECTOMY     SIGMOIDOSCOPY     TONSILLECTOMY     TRANSCAROTID ARTERY REVASCULARIZATION  Right 04/18/2022   Procedure: Transcarotid Artery Revascularization;  Surgeon: Victorino Sparrow, MD;  Location: [facility] OR;  Service:  Vascular;  Laterality: Right;   HPI:  Pt is a 77 yo female admitted on 04/15/23 with fall , unknown time on floor, covered in feces and urine. She was found to have extreme dehydration, rhabdomyolysis, AKI, encephalopathy, UTI and possible PNE. Pt with hx including but not limited to CAD, diabetes, hypertension, previous stroke, and peripheral arterial diseasee. She also has hx of traumatic injury to tongue from 10/2017 oral intubation. She was seen by ENT in August of 2019, who documented "bulbous tongue with evidence of previous midline tongue wound that is now healed; tongue tip is mildly tethered to right lower alveolar ridge by tight frenulum, tongue is otherwise soft, patient able to protrude tongue past lower lips, patient unable to touch roof of mouth with tongue, FOM soft, mucosa healthy throughout with no masses, lesions, ulcerations. She now reports persistent tongue discomfort, limited tongue mobility, dysarthria, burning tongue and tongue biting. Physical exam is notable for well healed bulbous tongue with midline wound and mild tethering inferiorly. She is unable to  touch roof of mouth with tongue. Symptoms may be related to mild tongue ankylosis. However, uncertain if any intervention would meaningfully improve her symptoms."   Assessment / Plan / Recommendation Clinical Impression  Pt presents with mild deficits in memory and a baseline dysarthria. She has difficulty providing chronological hx.  She does have chronic changes to her tongue after oral-intubation trauma in 2019. She was followed several times by ENT. SHe has a moderate dysarthria due to reduced tongue mobility; there are obvious structural changes.  She modifies her rate to improve her intelligibility and participated in speech therapy when she was in SNF rehab.  She is at baseline with regard to communication. No acute care SLP is needed. If pt is interested, she could pursue OP SLP for further assessment.    SLP Assessment  SLP  Recommendation/Assessment: All further Speech Lanaguage Pathology  needs can be addressed in the next venue of care SLP Visit Diagnosis: Dysarthria and anarthria (R47.1)    Recommendations for follow up therapy are one component of a multi-disciplinary discharge planning process, led by the attending physician.  Recommendations may be updated based on patient status, additional functional criteria and insurance authorization.    Follow Up Recommendations  Outpatient SLP (consider OP referral)                       SLP Evaluation Cognition  Overall Cognitive Status: Impaired/Different from baseline Arousal/Alertness: Awake/alert Orientation Level: Oriented X4 Memory: Impaired Memory Impairment: Retrieval deficit Awareness: Appears intact Safety/Judgment: Appears intact       Comprehension  Auditory Comprehension Overall Auditory Comprehension: Appears within functional limits for tasks assessed Reading Comprehension Reading Status: Not tested    Expression Expression Primary Mode of Expression: Verbal Verbal Expression Overall Verbal Expression: Appears within functional limits for tasks assessed Written Expression Dominant Hand: Right   Oral / Motor  Oral Motor/Sensory Function Overall Oral Motor/Sensory Function: Other (comment) (See HPI re: tongue) Motor Speech Overall Motor Speech: Impaired Respiration: Within functional limits Phonation: Normal Resonance: Within functional limits Articulation: Impaired Level of Impairment: Word Intelligibility: Intelligible Motor Planning: Witnin functional limits Motor Speech Errors: Not applicable Interfering Components: Premorbid status;Anatomical limitations Effective Techniques: Slow rate            Blenda Mounts Laurice 04/17/2023, 1:01 PM Jaymian Bogart L. Samson Frederic, MA CCC/SLP Clinical Specialist - Acute Care SLP Acute Rehabilitation Services Office number [phone]

## 2023-04-17 NOTE — Evaluation (Signed)
Physical Therapy Evaluation Patient Details Name: [name] MRN: [id number] DOB: [birth year] Today's Date: 04/17/2023  History of Present Illness  Pt is a 77 yo female admitted on 04/15/23 with fall , unknown time on floor, covered in feces and urine.  She was found to have extreme dehydration, rhabdomyolysis, AKI, encephalopathy, UTI and possible PNE.  Pt with hx including but not limited to CAD, diabetes, hypertension, previous stroke, and peripheral arterial disease  Clinical Impression  Pt admitted with above diagnosis. At baseline, pt lives alone, independent without AD, and his intermittent support from son and daughter in law.  Today, pt progressed better than expected after being found down for unknown time and first time OOB.  She was supervision for bed mobility, min A for transfers without RW, and ambulated 20' in room (min A without AD and min guard with RW).  Pt answering questions appropriately and following commands.  Her VSS on RA.  P Pt currently with functional limitations due to the deficits listed below (see PT Problem List). Pt will benefit from acute skilled PT to increase their independence and safety with mobility to allow discharge.  Pt likely to progress to home level with intermittent/daily supervision from family and HHPT services.          Assistance Recommended at Discharge Intermittent Supervision/Assistance  If plan is discharge home, recommend the following:  Can travel by private vehicle  A little help with walking and/or transfers;A little help with bathing/dressing/bathroom;Assistance with cooking/housework;Help with stairs or ramp for entrance        Equipment Recommendations None recommended by PT  Recommendations for Other Services       Functional Status Assessment Patient has had a recent decline in their functional status and demonstrates the ability to make significant improvements in function in a reasonable and predictable amount of time.      Precautions / Restrictions Precautions Precautions: Fall Restrictions Weight Bearing Restrictions: No      Mobility  Bed Mobility Overal bed mobility: Needs Assistance Bed Mobility: Supine to Sit     Supine to sit: Supervision          Transfers Overall transfer level: Needs assistance Equipment used: 1 person hand held assist Transfers: Sit to/from Stand Sit to Stand: Min assist           General transfer comment: Light min A to steady; started no RW as that is baseline    Ambulation/Gait Ambulation/Gait assistance: Min assist, Min guard Gait Distance (Feet): 20 Feet Assistive device: Rolling walker (2 wheels), 1 person hand held assist Gait Pattern/deviations: Step-to pattern, Decreased stride length Gait velocity: decreased     General Gait Details: started without RW but requiring HHA and min A to steady; added RW with improved balance and min guard level  Stairs            Wheelchair Mobility     Tilt Bed    Modified Rankin (Stroke Patients Only)       Balance Overall balance assessment: Needs assistance Sitting-balance support: No upper extremity supported Sitting balance-Leahy Scale: Good     Standing balance support: Single extremity supported, Bilateral upper extremity supported Standing balance-Leahy Scale: Poor Standing balance comment: REquiring UE support                             Pertinent Vitals/Pain Pain Assessment Pain Assessment: No/denies pain    Home Living Family/patient expects to be discharged  to:: Private residence Living Arrangements: Alone Available Help at Discharge: Family;Friend(s);Available PRN/intermittently Type of Home: House Home Access: Stairs to enter Entrance Stairs-Rails: Doctor, general practice of Steps: 2   Home Layout: One level Home Equipment: Pharmacist, hospital (2 wheels) Additional Comments: Son and daughter-in-law live nearby.; Reports typically sleeping  on the couch    Prior Function Prior Level of Function : Independent/Modified Independent;Driving             Mobility Comments: Indep without DME; could ambulate in community; recently went on girls beach trip ADLs Comments: independent with adls and light iadls     Hand Dominance        Extremity/Trunk Assessment   Upper Extremity Assessment Upper Extremity Assessment: Defer to OT evaluation    Lower Extremity Assessment Lower Extremity Assessment: Overall WFL for tasks assessed    Cervical / Trunk Assessment Cervical / Trunk Assessment: Kyphotic  Communication   Communication: No difficulties  Cognition Arousal/Alertness: Awake/alert Behavior During Therapy: WFL for tasks assessed/performed Overall Cognitive Status: Within Functional Limits for tasks assessed                                 General Comments: not formally assessed but following all commands and answering questions appropriately        General Comments General comments (skin integrity, edema, etc.): VSS on RA    Exercises     Assessment/Plan    PT Assessment Patient needs continued PT services  PT Problem List Decreased strength;Cardiopulmonary status limiting activity;Decreased range of motion;Decreased activity tolerance;Decreased balance;Decreased mobility;Decreased knowledge of use of DME       PT Treatment Interventions DME instruction;Therapeutic exercise;Gait training;Balance training;Stair training;Functional mobility training;Therapeutic activities;Patient/family education;Modalities    PT Goals (Current goals can be found in the Care Plan section)  Acute Rehab PT Goals Patient Stated Goal: return home PT Goal Formulation: With patient Time For Goal Achievement: 05/01/23 Potential to Achieve Goals: Good    Frequency Min 3X/week     Co-evaluation PT/OT/SLP Co-Evaluation/Treatment: Yes Reason for Co-Treatment: Complexity of the patient's impairments  (multi-system involvement) PT goals addressed during session: Mobility/safety with mobility;Balance         AM-PAC PT "6 Clicks" Mobility  Outcome Measure Help needed turning from your back to your side while in a flat bed without using bedrails?: A Little Help needed moving from lying on your back to sitting on the side of a flat bed without using bedrails?: A Little Help needed moving to and from a bed to a chair (including a wheelchair)?: A Little Help needed standing up from a chair using your arms (e.g., wheelchair or bedside chair)?: A Little Help needed to walk in hospital room?: A Little Help needed climbing 3-5 steps with a railing? : A Little 6 Click Score: 18    End of Session Equipment Utilized During Treatment: Gait belt Activity Tolerance: Patient tolerated treatment well Patient left: with chair alarm set;in chair;with call bell/phone within reach Nurse Communication: Mobility status PT Visit Diagnosis: Other abnormalities of gait and mobility (R26.89);History of falling (Z91.81)    Time: 4098-1191 PT Time Calculation (min) (ACUTE ONLY): 26 min   Charges:   PT Evaluation $PT Eval Low Complexity: 1 Low   PT General Charges $$ ACUTE PT VISIT: 1 Visit         Anise Salvo, PT Acute Rehab [facility] [phone]   Rayetta Humphrey 04/17/2023, 10:42 AM

## 2023-04-17 NOTE — Progress Notes (Signed)
PROGRESS NOTE                                                                                                                                                                                                             Patient Demographics:    [name], is a 77 y.o. female, DOB - 1946/04/12, UEA:[id number]  Outpatient Primary MD for the patient is Garlan Fillers, MD    LOS - 2  Admit date - 04/15/2023    Chief Complaint  Patient presents with   Altered Mental Status       Brief Narrative (HPI from H&P)   77 y.o. female with medical history significant of living by herself.  Per report patient is typically alert awake and fully oriented.  Unfortunately per report, patient was not heard from in the last 48 hours, a wellness check was performed and the patient was found in her home on the ground covered in urine and feces.  Patient is brought to [facility] ER.  In the ER workup was consistent with extreme dehydration, rhabdomyolysis, AKI, UTI and possible pneumonia and she was admitted.   Subjective:   Patient in bed, appears comfortable, denies any headache, no fever, no chest pain or pressure, no shortness of breath , no abdominal pain. No focal weakness.   Assessment  & Plan :    Acute metabolic encephalopathy, fall, rhabdomyolysis, dehydration, AKI. She was found down for unclear number of hours, last seen normal about 2 days prior to the episode, CT head and C-spine nonacute, she certainly has severe dehydration, rhabdo and AKI, continue IV fluids, antibiotics for UTI and pneumonia.  Advance activity with PT OT, monitor.  Stable EEG, mentation improving.  Continue to monitor  Pneumonia.  Aspiration likely, UTI.  Continue on present empiric antibiotics, responding well, follow cultures.  Speech to evaluate.   Rhabdomyolysis - see #1  Acute kidney injury superimposed on CKD 4 with baseline creatinine of close to 2-  continue hydration with IV fluids, agree with sodium bicarb.  Monitor bladder scans, able renal ultrasound on 04/16/2023.  Renal function stabilizing and coming close to her baseline.  Asymptomatic transaminitis, fatty liver - transaminitis due to rhabdo, ultrasound suggest fatty liver, trend LFTs and outpatient GI follow-up.  Chronic pancreatitis (HCC)  - C.w. pancreatic enzy,es  Accelerated hypertension  - Is a  chronic diagnosis, home dose beta-blocker continued, added Norvasc and as needed hydralazine.  Hypomagnesemia.  Replace.       Condition - Fair  Family Communication  :    Called son Fayrene Fearing [phone]  on 04/16/2023 at 7:40 AM and message left  Called daughter Toniann Fail [phone] on 04/16/2023 and updated  Code Status :  Full  Consults  :  None  PUD Prophylaxis :  PPI   Procedures  :     CT head and C-spine.  Nonacute.  Right upper quadrant ultrasound.  Fatty liver  Renal US.  Nonacute  EEG.  Nonspecific but no active seizures      Disposition Plan  :    Status is: Inpatient  DVT Prophylaxis  :    enoxaparin (LOVENOX) injection 30 mg Start: 04/17/23 1000 SCD's Start: 04/15/23 2102  Lab Results  Component Value Date   PLT 166 04/17/2023    Diet :  Diet Order             DIET SOFT Fluid consistency: Thin  Diet effective now                    Inpatient Medications  Scheduled Meds:  amLODipine  10 mg Oral Daily   aspirin  81 mg Oral Daily   atorvastatin  40 mg Oral Daily   cloNIDine  0.1 mg Oral QHS   clopidogrel  75 mg Oral QPM   enoxaparin (LOVENOX) injection  30 mg Subcutaneous Q24H   haloperidol lactate  2 mg Intravenous Once   insulin aspart  0-5 Units Subcutaneous QHS   insulin aspart  0-9 Units Subcutaneous TID WC   isosorbide mononitrate  60 mg Oral Daily   levETIRAcetam  500 mg Oral BID   lipase/protease/amylase  36,000 Units Oral BID WC   metoprolol succinate  100 mg Oral Daily   montelukast  10 mg Oral Daily   pantoprazole   40 mg Oral BID   sodium bicarbonate  650 mg Oral QID   venlafaxine XR  75 mg Oral Daily   Continuous Infusions:  azithromycin Stopped (04/16/23 2050)   cefTRIAXone (ROCEPHIN)  IV Stopped (04/16/23 2149)   thiamine (VITAMIN B1) injection 500 mg (04/16/23 1455)   PRN Meds:.acetaminophen **OR** acetaminophen (TYLENOL) oral liquid 160 mg/5 mL **OR** acetaminophen, albuterol, hydrALAZINE, labetalol, LORazepam, senna-docusate    Objective:   Vitals:   04/16/23 1633 04/16/23 1708 04/17/23 0430 04/17/23 0730  BP: (!) 143/62  (!) 157/65 (!) 129/112  Pulse: 84  74 78  Resp: 14  12 18   Temp:   97.8 F (36.6 C) 97.7 F (36.5 C)  TempSrc:   Oral Oral  SpO2: (!) 88% 95% 96% 95%  Weight:      Height:        Wt Readings from Last 3 Encounters:  04/15/23 68 kg  03/31/23 75.8 kg  02/18/23 78.1 kg     Intake/Output Summary (Last 24 hours) at 04/17/2023 0910 Last data filed at 04/17/2023 0600 Gross per 24 hour  Intake 843.65 ml  Output 450 ml  Net 393.65 ml     Physical Exam  Awake, much more alert today oriented x 3, no new F.N deficits, Normal affect [place].AT,PERRAL Supple Neck, No JVD,   Symmetrical Chest wall movement, Good air movement bilaterally, CTAB RRR,No Gallops,Rubs or new Murmurs,  +ve B.Sounds, Abd Soft, No tenderness,   No Cyanosis, Clubbing or edema       Data Review:  Recent Labs  Lab 04/15/23 1730 04/15/23 2159 04/16/23 0546 04/17/23 0155  WBC 19.5* 16.8* 15.2* 8.9  HGB 11.7* 10.1* 10.5* 8.6*  HCT 36.5 31.4* 31.3* 27.1*  PLT 205 169 150 166  MCV 93.1 93.5 93.2 92.5  MCH 29.8 30.1 31.3 29.4  MCHC 32.1 32.2 33.5 31.7  RDW 13.5 13.4 13.5 13.8  LYMPHSABS 0.4*  --   --  0.9  MONOABS 0.4  --   --  0.5  EOSABS 0.0  --   --  0.5  BASOSABS 0.1  --   --  0.0    Recent Labs  Lab 04/15/23 1730 04/15/23 1808 04/15/23 2000 04/15/23 2159 04/15/23 2328 04/16/23 0335 04/16/23 0546 04/17/23 0155  NA 135  --   --   --   --   --  134* 134*  K 3.8  --   --    --   --   --  3.7 3.8  CL 105  --   --   --   --   --  108 108  CO2 14*  --   --   --   --   --  15* 17*  ANIONGAP 16*  --   --   --   --   --  11 9  GLUCOSE 169*  --   --   --   --   --  108* 98  BUN 56*  --   --   --   --   --  51* 54*  CREATININE 4.00*  --   --  3.59*  --   --  3.24* 3.02*  AST 171*  --   --   --   --  111*  --  63*  ALT 69*  --   --   --   --  54*  --  42  ALKPHOS 101  --   --   --   --  92  --  84  BILITOT 0.9  --   --   --   --  0.6  --  0.4  ALBUMIN 3.0*  --   --   --   --  2.4*  --  2.1*  CRP  --   --   --   --   --   --  22.5* 20.9*  PROCALCITON  --   --   --   --   --   --  12.10 6.10  LATICACIDVEN  --  2.8* 2.7* 1.8 2.3*  --   --   --   INR  --   --   --   --   --  1.3*  --   --   TSH  --   --   --  1.013  --   --  0.803  --   AMMONIA  --   --   --   --   --   --  16  --   BNP  --   --   --   --   --   --  505.6* 267.3*  MG  --   --   --   --   --   --  1.1* 2.4  CALCIUM 9.3  --   --   --   --   --  8.2* 8.3*      Recent Labs  Lab 04/15/23 1730 04/15/23 1808 04/15/23 2000 04/15/23 2159 04/15/23 2328 04/16/23 0335 04/16/23  1610 04/17/23 0155  CRP  --   --   --   --   --   --  22.5* 20.9*  PROCALCITON  --   --   --   --   --   --  12.10 6.10  LATICACIDVEN  --  2.8* 2.7* 1.8 2.3*  --   --   --   INR  --   --   --   --   --  1.3*  --   --   TSH  --   --   --  1.013  --   --  0.803  --   AMMONIA  --   --   --   --   --   --  16  --   BNP  --   --   --   --   --   --  505.6* 267.3*  MG  --   --   --   --   --   --  1.1* 2.4  CALCIUM 9.3  --   --   --   --   --  8.2* 8.3*   Lab Results  Component Value Date   CHOL 104 04/16/2023   HDL 30 (L) 04/16/2023   LDLCALC 46 04/16/2023   TRIG 138 04/16/2023   CHOLHDL 3.5 04/16/2023    Lab Results  Component Value Date   HGBA1C 6.5 (H) 01/02/2023     Radiology Reports US RENAL  Result Date: 04/16/2023 CLINICAL DATA:  Acute kidney injury EXAM: RENAL / URINARY TRACT ULTRASOUND COMPLETE COMPARISON:   CT abdomen pelvis 01/02/2023 FINDINGS: Right Kidney: Renal measurements: 9.2 x 4.2 x 4.9 cm = volume: 111 mL. Echogenicity within normal limits. No mass or hydronephrosis visualized. Left Kidney: Renal measurements: 9.5 x 5.6 x 5.3 cm = volume: 146 mL. Echogenicity within normal limits. No mass or hydronephrosis visualized. 1.0 cm simple cyst at the lower pole does not require dedicated imaging surveillance. Bladder: Appears normal for degree of bladder distention. Other: Diffuse increased echogenicity of the visualized portions of the hepatic parenchyma are a nonspecific indicator of hepatocellular dysfunction, most commonly steatosis. IMPRESSION: No significant sonographic abnormality of the kidneys. Electronically Signed   By: Acquanetta Belling M.D.   On: 04/16/2023 13:54   EEG adult  Result Date: 04/16/2023 Charlsie Quest, MD     04/16/2023 12:16 PM Patient Name: ZILA LEAS MRN: [id number] Epilepsy Attending: Charlsie Quest Referring Physician/Provider: Nolberto Hanlon, MD Date: 04/16/2023 Duration: 23.30 mins Patient history: 76yo f with ams getting eeg to evaluate for seizure. Level of alertness: Awake AEDs during EEG study: LEV Technical aspects: This EEG study was done with scalp electrodes positioned according to the 10-20 International system of electrode placement. Electrical activity was reviewed with band pass filter of 1-70Hz , sensitivity of 7 uV/mm, display speed of 32mm/sec with a 60Hz  notched filter applied as appropriate. EEG data were recorded continuously and digitally stored.  Video monitoring was available and reviewed as appropriate. Description: The posterior dominant rhythm consists of 7.5 Hz activity of moderate voltage (25-35 uV) seen predominantly in posterior head regions, symmetric and reactive to eye opening and eye closing. Hyperventilation and photic stimulation were not performed.   IMPRESSION: This study is within normal limits. No seizures or epileptiform discharges were seen  throughout the recording. A normal interictal EEG does not exclude the diagnosis of epilepsy. Charlsie Quest   DG Chest Port 1 View  Result Date: 04/16/2023  CLINICAL DATA:  Shortness of breath. EXAM: PORTABLE CHEST 1 VIEW COMPARISON:  04/15/2023 FINDINGS: Stable cardiomediastinal contours. Aortic atherosclerotic calcifications. Decreased lung volumes. Increase interstitial and airspace opacities within the right lung compared with the previous exam. Left lung appears clear. Age indeterminate deformity involving the greater tuberosity of the left humeral head which appears new compared with 12/25/2021. IMPRESSION: 1. Increased interstitial and airspace opacities within the right lung compared with the previous exam. 2. Age indeterminate deformity involving the greater tuberosity of the left humeral head which appears new compared with 12/25/2021. Correlate for any clinical signs or symptoms of pain within this area. If the patient is symptomatic consider further evaluation with dedicated radiographs of the left shoulder. Electronically Signed   By: Signa Kell M.D.   On: 04/16/2023 07:01   CT Head Wo Contrast  Result Date: 04/15/2023 CLINICAL DATA:  Head and neck trauma EXAM: CT HEAD WITHOUT CONTRAST CT CERVICAL SPINE WITHOUT CONTRAST TECHNIQUE: Multidetector CT imaging of the head and cervical spine was performed following the standard protocol without intravenous contrast. Multiplanar CT image reconstructions of the cervical spine were also generated. RADIATION DOSE REDUCTION: This exam was performed according to the departmental dose-optimization program which includes automated exposure control, adjustment of the mA and/or kV according to patient size and/or use of iterative reconstruction technique. COMPARISON:  01/02/2023 CT head, 04/03/2022 CTA neck, no prior CT cervical spine available FINDINGS: CT HEAD FINDINGS Evaluation is somewhat limited by motion artifact. Brain: No evidence of acute infarct,  hemorrhage, mass, mass effect, or midline shift. No hydrocephalus or extra-axial fluid collection. Periventricular white matter changes, likely the sequela of chronic small vessel ischemic disease. Vascular: No hyperdense vessel. Atherosclerotic calcifications in the intracranial carotid and vertebral arteries. Skull: Negative for fracture or focal lesion. Sinuses/Orbits: No acute finding. CT CERVICAL SPINE FINDINGS Alignment: No traumatic listhesis. Skull base and vertebrae: No acute fracture or suspicious osseous lesion. Soft tissues and spinal canal: No prevertebral fluid or swelling. No visible canal hematoma. Disc levels: Degenerative changes in the cervical spine. Moderate spinal canal stenosis at C5-C6 and mild spinal canal stenosis at C6-C7. Upper chest: Ground-glass opacities in the imaged right lung (series 5, image 76). No pleural effusion. Aortic atherosclerosis. IMPRESSION: 1. No acute intracranial process. 2. No acute fracture or traumatic listhesis in the cervical spine. 3. Ground-glass opacities in the imaged right lung, which could be infectious or inflammatory. 4. Aortic atherosclerosis. Aortic Atherosclerosis (ICD10-I70.0). Electronically Signed   By: Wiliam Ke M.D.   On: 04/15/2023 19:59   CT Cervical Spine Wo Contrast  Result Date: 04/15/2023 CLINICAL DATA:  Head and neck trauma EXAM: CT HEAD WITHOUT CONTRAST CT CERVICAL SPINE WITHOUT CONTRAST TECHNIQUE: Multidetector CT imaging of the head and cervical spine was performed following the standard protocol without intravenous contrast. Multiplanar CT image reconstructions of the cervical spine were also generated. RADIATION DOSE REDUCTION: This exam was performed according to the departmental dose-optimization program which includes automated exposure control, adjustment of the mA and/or kV according to patient size and/or use of iterative reconstruction technique. COMPARISON:  01/02/2023 CT head, 04/03/2022 CTA neck, no prior CT cervical  spine available FINDINGS: CT HEAD FINDINGS Evaluation is somewhat limited by motion artifact. Brain: No evidence of acute infarct, hemorrhage, mass, mass effect, or midline shift. No hydrocephalus or extra-axial fluid collection. Periventricular white matter changes, likely the sequela of chronic small vessel ischemic disease. Vascular: No hyperdense vessel. Atherosclerotic calcifications in the intracranial carotid and vertebral arteries. Skull: Negative for fracture or  focal lesion. Sinuses/Orbits: No acute finding. CT CERVICAL SPINE FINDINGS Alignment: No traumatic listhesis. Skull base and vertebrae: No acute fracture or suspicious osseous lesion. Soft tissues and spinal canal: No prevertebral fluid or swelling. No visible canal hematoma. Disc levels: Degenerative changes in the cervical spine. Moderate spinal canal stenosis at C5-C6 and mild spinal canal stenosis at C6-C7. Upper chest: Ground-glass opacities in the imaged right lung (series 5, image 76). No pleural effusion. Aortic atherosclerosis. IMPRESSION: 1. No acute intracranial process. 2. No acute fracture or traumatic listhesis in the cervical spine. 3. Ground-glass opacities in the imaged right lung, which could be infectious or inflammatory. 4. Aortic atherosclerosis. Aortic Atherosclerosis (ICD10-I70.0). Electronically Signed   By: Wiliam Ke M.D.   On: 04/15/2023 19:59   US Abdomen Limited RUQ (LIVER/GB)  Result Date: 04/15/2023 CLINICAL DATA:  Elevated liver enzyme EXAM: ULTRASOUND ABDOMEN LIMITED RIGHT UPPER QUADRANT COMPARISON:  09/12/2016 right upper quadrant ultrasound FINDINGS: Gallbladder: No gallstones or wall thickening visualized. No sonographic Murphy sign noted by sonographer. Common bile duct: Diameter: 2 mm, within normal limits. No intrahepatic biliary ductal dilatation Liver: No focal lesion identified. Increased parenchymal echogenicity. Portal vein is patent on color Doppler imaging with normal direction of blood flow  towards the liver. Other: No left lateral decubitus images could be obtained, as the patient asked to terminate the exam. IMPRESSION: Hepatic steatosis. Electronically Signed   By: Wiliam Ke M.D.   On: 04/15/2023 19:48   DG Hip Unilat W or Wo Pelvis 2-3 Views Right  Result Date: 04/15/2023 CLINICAL DATA:  Recent fall with right hip pain, initial EXAM: DG HIP (WITH OR WITHOUT PELVIS) 3V RIGHT COMPARISON:  None Available. FINDINGS: No acute fracture or dislocation is noted. Vascular calcifications are seen. Mild degenerative changes of the hip joints are noted. No soft tissue abnormality is seen. IMPRESSION: No acute abnormality noted. Electronically Signed   By: Alcide Clever M.D.   On: 04/15/2023 19:18   DG Chest Portable 1 View  Result Date: 04/15/2023 CLINICAL DATA:  Pain after fall EXAM: PORTABLE CHEST 1 VIEW COMPARISON:  Chest x-ray 01/02/2023 FINDINGS: No pneumothorax, effusion or edema. Normal cardiopericardial silhouette. Calcified aorta. Slight ill-defined hazy opacity at the right lung base. A subtle infiltrates possible. Recommend follow-up. Overlapping cardiac leads. IMPRESSION: Slight ill-defined hazy opacity in the right lung base, possible subtle infiltrate. Recommend follow-up Electronically Signed   By: Karen Kays M.D.   On: 04/15/2023 18:08      Signature  -   Susa Raring M.D on 04/17/2023 at 9:10 AM   -  To page go to [email or web address]

## 2023-04-18 ENCOUNTER — Ambulatory Visit (HOSPITAL_COMMUNITY): Payer: Medicare HMO

## 2023-04-18 DIAGNOSIS — R410 Disorientation, unspecified: Secondary | ICD-10-CM | POA: Diagnosis not present

## 2023-04-18 LAB — COMPREHENSIVE METABOLIC PANEL
ALT: 43 U/L (ref 0–44)
AST: 53 U/L — ABNORMAL HIGH (ref 15–41)
Albumin: 2.3 g/dL — ABNORMAL LOW (ref 3.5–5.0)
Alkaline Phosphatase: 108 U/L (ref 38–126)
Anion gap: 10 (ref 5–15)
BUN: 44 mg/dL — ABNORMAL HIGH (ref 8–23)
CO2: 17 mmol/L — ABNORMAL LOW (ref 22–32)
Calcium: 8.5 mg/dL — ABNORMAL LOW (ref 8.9–10.3)
Chloride: 108 mmol/L (ref 98–111)
Creatinine, Ser: 2.47 mg/dL — ABNORMAL HIGH (ref 0.44–1.00)
GFR, Estimated: 20 mL/min — ABNORMAL LOW (ref >60)
Glucose, Bld: 91 mg/dL (ref 70–99)
Potassium: 3.6 mmol/L (ref 3.5–5.1)
Sodium: 135 mmol/L (ref 135–145)
Total Bilirubin: 0.2 mg/dL — ABNORMAL LOW (ref 0.3–1.2)
Total Protein: 5.2 g/dL — ABNORMAL LOW (ref 6.5–8.1)

## 2023-04-18 LAB — CULTURE, BLOOD (ROUTINE X 2): Culture: NO GROWTH

## 2023-04-18 LAB — CBC WITH DIFFERENTIAL/PLATELET
Abs Immature Granulocytes: 0.06 10*3/uL (ref 0.00–0.07)
Basophils Absolute: 0 10*3/uL (ref 0.0–0.1)
Basophils Relative: 1 %
Eosinophils Absolute: 0.4 10*3/uL (ref 0.0–0.5)
Eosinophils Relative: 6 %
HCT: 32.2 % — ABNORMAL LOW (ref 36.0–46.0)
Hemoglobin: 10.4 g/dL — ABNORMAL LOW (ref 12.0–15.0)
Immature Granulocytes: 1 %
Lymphocytes Relative: 18 %
Lymphs Abs: 1.1 10*3/uL (ref 0.7–4.0)
MCH: 30.6 pg (ref 26.0–34.0)
MCHC: 32.3 g/dL (ref 30.0–36.0)
MCV: 94.7 fL (ref 80.0–100.0)
Monocytes Absolute: 0.5 10*3/uL (ref 0.1–1.0)
Monocytes Relative: 8 %
Neutro Abs: 3.9 10*3/uL (ref 1.7–7.7)
Neutrophils Relative %: 66 %
Platelets: 168 10*3/uL (ref 150–400)
RBC: 3.4 MIL/uL — ABNORMAL LOW (ref 3.87–5.11)
RDW: 13.6 % (ref 11.5–15.5)
WBC: 5.9 10*3/uL (ref 4.0–10.5)
nRBC: 0 % (ref 0.0–0.2)

## 2023-04-18 LAB — C-REACTIVE PROTEIN: CRP: 13 mg/dL — ABNORMAL HIGH (ref <1.0)

## 2023-04-18 LAB — GLUCOSE, CAPILLARY
Glucose-Capillary: 90 mg/dL (ref 70–99)
Glucose-Capillary: 182 mg/dL — ABNORMAL HIGH (ref 70–99)
Glucose-Capillary: 106 mg/dL — ABNORMAL HIGH (ref 70–99)
Glucose-Capillary: 118 mg/dL — ABNORMAL HIGH (ref 70–99)

## 2023-04-18 LAB — MAGNESIUM: Magnesium: 1.7 mg/dL (ref 1.7–2.4)

## 2023-04-18 LAB — BRAIN NATRIURETIC PEPTIDE: B Natriuretic Peptide: 847.8 pg/mL — ABNORMAL HIGH (ref 0.0–100.0)

## 2023-04-18 LAB — CULTURE, BLOOD (SINGLE): Special Requests: ADEQUATE

## 2023-04-18 LAB — PROCALCITONIN: Procalcitonin: 3.04 ng/mL

## 2023-04-18 LAB — CK: Total CK: 330 U/L — ABNORMAL HIGH (ref 38–234)

## 2023-04-18 MED ORDER — HYDROCODONE-ACETAMINOPHEN 5-325 MG PO TABS
1.0000 | ORAL_TABLET | Freq: Four times a day (QID) | ORAL | Status: DC | PRN
  Administered 2023-04-18: 1 via ORAL
  Filled 2023-04-18: qty 1

## 2023-04-18 MED ORDER — HYDRALAZINE HCL 50 MG PO TABS
50.0000 mg | ORAL_TABLET | Freq: Three times a day (TID) | ORAL | Status: DC
  Administered 2023-04-18 – 2023-04-19 (×4): 50 mg via ORAL
  Filled 2023-04-18 (×4): qty 1

## 2023-04-18 MED ORDER — CLONIDINE HCL 0.1 MG PO TABS
0.1000 mg | ORAL_TABLET | Freq: Three times a day (TID) | ORAL | Status: DC
  Administered 2023-04-18 – 2023-04-20 (×8): 0.1 mg via ORAL
  Filled 2023-04-18 (×8): qty 1

## 2023-04-18 NOTE — Care Management Important Message (Signed)
Important Message  Patient Details  Name: [name] MRN: [id number] Date of Birth: 1946-06-07   Medicare Important Message Given:        Dorena Bodo 04/18/2023, 2:58 PM

## 2023-04-18 NOTE — Plan of Care (Signed)

## 2023-04-18 NOTE — Progress Notes (Signed)
PROGRESS NOTE                                                                                                                                                                                                             Patient Demographics:    [name], is a 77 y.o. female, DOB - 28-May-1946, WUJ:[id number]  Outpatient Primary MD for the patient is Garlan Fillers, MD    LOS - 3  Admit date - 04/15/2023    Chief Complaint  Patient presents with   Altered Mental Status       Brief Narrative (HPI from H&P)   77 y.o. female with medical history significant of living by herself.  Per report patient is typically alert awake and fully oriented.  Unfortunately per report, patient was not heard from in the last 48 hours, a wellness check was performed and the patient was found in her home on the ground covered in urine and feces.  Patient is brought to [facility] ER.  In the ER workup was consistent with extreme dehydration, rhabdomyolysis, AKI, UTI and possible pneumonia and she was admitted.   Subjective:   Patient in bed, appears comfortable, denies any headache, no fever, no chest pain or pressure, no shortness of breath , no abdominal pain. No focal weakness.   Assessment  & Plan :    Acute metabolic encephalopathy, fall, rhabdomyolysis, dehydration, AKI. She was found down for unclear number of hours, last seen normal about 2 days prior to the episode, CT head and C-spine nonacute, she certainly has severe dehydration, rhabdo and AKI, continue IV fluids, antibiotics for UTI and pneumonia.  Advance activity with PT OT, monitor.  Stable EEG, mentation improving. Continue to monitor with PT OT and advancing activity likely discharge in the next 1 to 2 days.  Pneumonia.  Aspiration likely, UTI.  Continue on present empiric antibiotics, responding well, follow cultures.  Speech to evaluate.  Rhabdomyolysis - see #1  Acute kidney  injury superimposed on CKD 4 with baseline creatinine of close to 2- continue hydration with IV fluids, agree with sodium bicarb.  Monitor bladder scans, able renal ultrasound on 04/16/2023.  Renal function stabilizing and coming close to her baseline.  Asymptomatic transaminitis, fatty liver - transaminitis due to rhabdo, ultrasound suggest fatty liver, trend LFTs and outpatient GI follow-up.  Chronic pancreatitis (  HCC)  -  home dose of pancreatic enzymes  Accelerated hypertension  - BP meds adjusted further for better control as needed hydralazine also added  Hypomagnesemia.  Replace.       Condition - Fair  Family Communication  :    Called son Fayrene Fearing [phone]  on 04/16/2023 at 7:40 AM and message left  Called daughter Toniann Fail [phone] on 04/16/2023 and updated  Code Status :  Full  Consults  :  None  PUD Prophylaxis :  PPI   Procedures  :     CT head and C-spine.  Nonacute.  Right upper quadrant ultrasound.  Fatty liver  Renal US.  Nonacute  EEG.  Nonspecific but no active seizures      Disposition Plan  :    Status is: Inpatient  DVT Prophylaxis  :    enoxaparin (LOVENOX) injection 30 mg Start: 04/17/23 1000 SCD's Start: 04/15/23 2102  Lab Results  Component Value Date   PLT 168 04/18/2023    Diet :  Diet Order             DIET SOFT Fluid consistency: Thin  Diet effective now                    Inpatient Medications  Scheduled Meds:  amLODipine  10 mg Oral Daily   aspirin  81 mg Oral Daily   [START ON 04/19/2023] atorvastatin  40 mg Oral Daily   cloNIDine  0.1 mg Oral TID   clopidogrel  75 mg Oral QPM   enoxaparin (LOVENOX) injection  30 mg Subcutaneous Q24H   haloperidol lactate  2 mg Intravenous Once   insulin aspart  0-5 Units Subcutaneous QHS   insulin aspart  0-9 Units Subcutaneous TID WC   isosorbide mononitrate  60 mg Oral Daily   levETIRAcetam  500 mg Oral BID   lipase/protease/amylase  36,000 Units Oral BID WC   metoprolol  succinate  100 mg Oral Daily   montelukast  10 mg Oral Daily   pantoprazole  40 mg Oral BID   sodium bicarbonate  650 mg Oral QID   venlafaxine XR  75 mg Oral Daily   Continuous Infusions:  cefTRIAXone (ROCEPHIN)  IV Stopped (04/17/23 2222)   PRN Meds:.acetaminophen **OR** acetaminophen (TYLENOL) oral liquid 160 mg/5 mL **OR** acetaminophen, albuterol, hydrALAZINE, labetalol, loperamide, LORazepam, senna-docusate    Objective:   Vitals:   04/17/23 2200 04/18/23 0000 04/18/23 0300 04/18/23 0802  BP:  (!) 181/90 (!) 191/99 (!) 218/107  Pulse: 75 78 83 85  Resp: 19 16 17 17   Temp:  98 F (36.7 C) (!) 97.4 F (36.3 C) 97.6 F (36.4 C)  TempSrc:  Oral Oral Oral  SpO2: 95% 96% 96%   Weight:   80.8 kg   Height:        Wt Readings from Last 3 Encounters:  04/18/23 80.8 kg  03/31/23 75.8 kg  02/18/23 78.1 kg     Intake/Output Summary (Last 24 hours) at 04/18/2023 6578 Last data filed at 04/18/2023 4696 Gross per 24 hour  Intake 400 ml  Output --  Net 400 ml     Physical Exam  Awake, much more alert today oriented x 3, no new F.N deficits, Normal affect [name],PERRAL Supple Neck, No JVD,   Symmetrical Chest wall movement, Good air movement bilaterally, CTAB RRR,No Gallops,Rubs or new Murmurs,  +ve B.Sounds, Abd Soft, No tenderness,   No Cyanosis, Clubbing or edema  Data Review:    Recent Labs  Lab 04/15/23 1730 04/15/23 2159 04/16/23 0546 04/17/23 0155 04/18/23 0412  WBC 19.5* 16.8* 15.2* 8.9 5.9  HGB 11.7* 10.1* 10.5* 8.6* 10.4*  HCT 36.5 31.4* 31.3* 27.1* 32.2*  PLT 205 169 150 166 168  MCV 93.1 93.5 93.2 92.5 94.7  MCH 29.8 30.1 31.3 29.4 30.6  MCHC 32.1 32.2 33.5 31.7 32.3  RDW 13.5 13.4 13.5 13.8 13.6  LYMPHSABS 0.4*  --   --  0.9 1.1  MONOABS 0.4  --   --  0.5 0.5  EOSABS 0.0  --   --  0.5 0.4  BASOSABS 0.1  --   --  0.0 0.0    Recent Labs  Lab 04/15/23 1730 04/15/23 1808 04/15/23 2000 04/15/23 2159 04/15/23 2328 04/16/23 0335  04/16/23 0546 04/17/23 0155 04/18/23 0412  NA 135  --   --   --   --   --  134* 134* 135  K 3.8  --   --   --   --   --  3.7 3.8 3.6  CL 105  --   --   --   --   --  108 108 108  CO2 14*  --   --   --   --   --  15* 17* 17*  ANIONGAP 16*  --   --   --   --   --  11 9 10   GLUCOSE 169*  --   --   --   --   --  108* 98 91  BUN 56*  --   --   --   --   --  51* 54* 44*  CREATININE 4.00*  --   --  3.59*  --   --  3.24* 3.02* 2.47*  AST 171*  --   --   --   --  111*  --  63* 53*  ALT 69*  --   --   --   --  54*  --  42 43  ALKPHOS 101  --   --   --   --  92  --  84 108  BILITOT 0.9  --   --   --   --  0.6  --  0.4 0.2*  ALBUMIN 3.0*  --   --   --   --  2.4*  --  2.1* 2.3*  CRP  --   --   --   --   --   --  22.5* 20.9* 13.0*  PROCALCITON  --   --   --   --   --   --  12.10 6.10 3.04  LATICACIDVEN  --  2.8* 2.7* 1.8 2.3*  --   --   --   --   INR  --   --   --   --   --  1.3*  --   --   --   TSH  --   --   --  1.013  --   --  0.803  --   --   AMMONIA  --   --   --   --   --   --  16  --   --   BNP  --   --   --   --   --   --  505.6* 267.3* 847.8*  MG  --   --   --   --   --   --  1.1* 2.4 1.7  CALCIUM 9.3  --   --   --   --   --  8.2* 8.3* 8.5*      Recent Labs  Lab 04/15/23 1730 04/15/23 1808 04/15/23 2000 04/15/23 2159 04/15/23 2328 04/16/23 0335 04/16/23 0546 04/17/23 0155 04/18/23 0412  CRP  --   --   --   --   --   --  22.5* 20.9* 13.0*  PROCALCITON  --   --   --   --   --   --  12.10 6.10 3.04  LATICACIDVEN  --  2.8* 2.7* 1.8 2.3*  --   --   --   --   INR  --   --   --   --   --  1.3*  --   --   --   TSH  --   --   --  1.013  --   --  0.803  --   --   AMMONIA  --   --   --   --   --   --  16  --   --   BNP  --   --   --   --   --   --  505.6* 267.3* 847.8*  MG  --   --   --   --   --   --  1.1* 2.4 1.7  CALCIUM 9.3  --   --   --   --   --  8.2* 8.3* 8.5*   Lab Results  Component Value Date   CHOL 104 04/16/2023   HDL 30 (L) 04/16/2023   LDLCALC 46 04/16/2023   TRIG  138 04/16/2023   CHOLHDL 3.5 04/16/2023    Lab Results  Component Value Date   HGBA1C 6.5 (H) 01/02/2023     Radiology Reports US RENAL  Result Date: 04/16/2023 CLINICAL DATA:  Acute kidney injury EXAM: RENAL / URINARY TRACT ULTRASOUND COMPLETE COMPARISON:  CT abdomen pelvis 01/02/2023 FINDINGS: Right Kidney: Renal measurements: 9.2 x 4.2 x 4.9 cm = volume: 111 mL. Echogenicity within normal limits. No mass or hydronephrosis visualized. Left Kidney: Renal measurements: 9.5 x 5.6 x 5.3 cm = volume: 146 mL. Echogenicity within normal limits. No mass or hydronephrosis visualized. 1.0 cm simple cyst at the lower pole does not require dedicated imaging surveillance. Bladder: Appears normal for degree of bladder distention. Other: Diffuse increased echogenicity of the visualized portions of the hepatic parenchyma are a nonspecific indicator of hepatocellular dysfunction, most commonly steatosis. IMPRESSION: No significant sonographic abnormality of the kidneys. Electronically Signed   By: Acquanetta Belling M.D.   On: 04/16/2023 13:54   EEG adult  Result Date: 04/16/2023 Charlsie Quest, MD     04/16/2023 12:16 PM Patient Name: DEVLYNN PEARL MRN: [id number] Epilepsy Attending: Charlsie Quest Referring Physician/Provider: Nolberto Hanlon, MD Date: 04/16/2023 Duration: 23.30 mins Patient history: 76yo f with ams getting eeg to evaluate for seizure. Level of alertness: Awake AEDs during EEG study: LEV Technical aspects: This EEG study was done with scalp electrodes positioned according to the 10-20 International system of electrode placement. Electrical activity was reviewed with band pass filter of 1-70Hz , sensitivity of 7 uV/mm, display speed of 34mm/sec with a 60Hz  notched filter applied as appropriate. EEG data were recorded continuously and digitally stored.  Video monitoring was available and reviewed as appropriate. Description: The posterior dominant rhythm consists of 7.5 Hz activity of moderate voltage (25-35  uV) seen  predominantly in posterior head regions, symmetric and reactive to eye opening and eye closing. Hyperventilation and photic stimulation were not performed.   IMPRESSION: This study is within normal limits. No seizures or epileptiform discharges were seen throughout the recording. A normal interictal EEG does not exclude the diagnosis of epilepsy. Charlsie Quest   DG Chest Port 1 View  Result Date: 04/16/2023 CLINICAL DATA:  Shortness of breath. EXAM: PORTABLE CHEST 1 VIEW COMPARISON:  04/15/2023 FINDINGS: Stable cardiomediastinal contours. Aortic atherosclerotic calcifications. Decreased lung volumes. Increase interstitial and airspace opacities within the right lung compared with the previous exam. Left lung appears clear. Age indeterminate deformity involving the greater tuberosity of the left humeral head which appears new compared with 12/25/2021. IMPRESSION: 1. Increased interstitial and airspace opacities within the right lung compared with the previous exam. 2. Age indeterminate deformity involving the greater tuberosity of the left humeral head which appears new compared with 12/25/2021. Correlate for any clinical signs or symptoms of pain within this area. If the patient is symptomatic consider further evaluation with dedicated radiographs of the left shoulder. Electronically Signed   By: Signa Kell M.D.   On: 04/16/2023 07:01   CT Head Wo Contrast  Result Date: 04/15/2023 CLINICAL DATA:  Head and neck trauma EXAM: CT HEAD WITHOUT CONTRAST CT CERVICAL SPINE WITHOUT CONTRAST TECHNIQUE: Multidetector CT imaging of the head and cervical spine was performed following the standard protocol without intravenous contrast. Multiplanar CT image reconstructions of the cervical spine were also generated. RADIATION DOSE REDUCTION: This exam was performed according to the departmental dose-optimization program which includes automated exposure control, adjustment of the mA and/or kV according to  patient size and/or use of iterative reconstruction technique. COMPARISON:  01/02/2023 CT head, 04/03/2022 CTA neck, no prior CT cervical spine available FINDINGS: CT HEAD FINDINGS Evaluation is somewhat limited by motion artifact. Brain: No evidence of acute infarct, hemorrhage, mass, mass effect, or midline shift. No hydrocephalus or extra-axial fluid collection. Periventricular white matter changes, likely the sequela of chronic small vessel ischemic disease. Vascular: No hyperdense vessel. Atherosclerotic calcifications in the intracranial carotid and vertebral arteries. Skull: Negative for fracture or focal lesion. Sinuses/Orbits: No acute finding. CT CERVICAL SPINE FINDINGS Alignment: No traumatic listhesis. Skull base and vertebrae: No acute fracture or suspicious osseous lesion. Soft tissues and spinal canal: No prevertebral fluid or swelling. No visible canal hematoma. Disc levels: Degenerative changes in the cervical spine. Moderate spinal canal stenosis at C5-C6 and mild spinal canal stenosis at C6-C7. Upper chest: Ground-glass opacities in the imaged right lung (series 5, image 76). No pleural effusion. Aortic atherosclerosis. IMPRESSION: 1. No acute intracranial process. 2. No acute fracture or traumatic listhesis in the cervical spine. 3. Ground-glass opacities in the imaged right lung, which could be infectious or inflammatory. 4. Aortic atherosclerosis. Aortic Atherosclerosis (ICD10-I70.0). Electronically Signed   By: Wiliam Ke M.D.   On: 04/15/2023 19:59   CT Cervical Spine Wo Contrast  Result Date: 04/15/2023 CLINICAL DATA:  Head and neck trauma EXAM: CT HEAD WITHOUT CONTRAST CT CERVICAL SPINE WITHOUT CONTRAST TECHNIQUE: Multidetector CT imaging of the head and cervical spine was performed following the standard protocol without intravenous contrast. Multiplanar CT image reconstructions of the cervical spine were also generated. RADIATION DOSE REDUCTION: This exam was performed according to  the departmental dose-optimization program which includes automated exposure control, adjustment of the mA and/or kV according to patient size and/or use of iterative reconstruction technique. COMPARISON:  01/02/2023 CT head, 04/03/2022 CTA neck, no prior  CT cervical spine available FINDINGS: CT HEAD FINDINGS Evaluation is somewhat limited by motion artifact. Brain: No evidence of acute infarct, hemorrhage, mass, mass effect, or midline shift. No hydrocephalus or extra-axial fluid collection. Periventricular white matter changes, likely the sequela of chronic small vessel ischemic disease. Vascular: No hyperdense vessel. Atherosclerotic calcifications in the intracranial carotid and vertebral arteries. Skull: Negative for fracture or focal lesion. Sinuses/Orbits: No acute finding. CT CERVICAL SPINE FINDINGS Alignment: No traumatic listhesis. Skull base and vertebrae: No acute fracture or suspicious osseous lesion. Soft tissues and spinal canal: No prevertebral fluid or swelling. No visible canal hematoma. Disc levels: Degenerative changes in the cervical spine. Moderate spinal canal stenosis at C5-C6 and mild spinal canal stenosis at C6-C7. Upper chest: Ground-glass opacities in the imaged right lung (series 5, image 76). No pleural effusion. Aortic atherosclerosis. IMPRESSION: 1. No acute intracranial process. 2. No acute fracture or traumatic listhesis in the cervical spine. 3. Ground-glass opacities in the imaged right lung, which could be infectious or inflammatory. 4. Aortic atherosclerosis. Aortic Atherosclerosis (ICD10-I70.0). Electronically Signed   By: Wiliam Ke M.D.   On: 04/15/2023 19:59   US Abdomen Limited RUQ (LIVER/GB)  Result Date: 04/15/2023 CLINICAL DATA:  Elevated liver enzyme EXAM: ULTRASOUND ABDOMEN LIMITED RIGHT UPPER QUADRANT COMPARISON:  09/12/2016 right upper quadrant ultrasound FINDINGS: Gallbladder: No gallstones or wall thickening visualized. No sonographic Murphy sign noted by  sonographer. Common bile duct: Diameter: 2 mm, within normal limits. No intrahepatic biliary ductal dilatation Liver: No focal lesion identified. Increased parenchymal echogenicity. Portal vein is patent on color Doppler imaging with normal direction of blood flow towards the liver. Other: No left lateral decubitus images could be obtained, as the patient asked to terminate the exam. IMPRESSION: Hepatic steatosis. Electronically Signed   By: Wiliam Ke M.D.   On: 04/15/2023 19:48   DG Hip Unilat W or Wo Pelvis 2-3 Views Right  Result Date: 04/15/2023 CLINICAL DATA:  Recent fall with right hip pain, initial EXAM: DG HIP (WITH OR WITHOUT PELVIS) 3V RIGHT COMPARISON:  None Available. FINDINGS: No acute fracture or dislocation is noted. Vascular calcifications are seen. Mild degenerative changes of the hip joints are noted. No soft tissue abnormality is seen. IMPRESSION: No acute abnormality noted. Electronically Signed   By: Alcide Clever M.D.   On: 04/15/2023 19:18   DG Chest Portable 1 View  Result Date: 04/15/2023 CLINICAL DATA:  Pain after fall EXAM: PORTABLE CHEST 1 VIEW COMPARISON:  Chest x-ray 01/02/2023 FINDINGS: No pneumothorax, effusion or edema. Normal cardiopericardial silhouette. Calcified aorta. Slight ill-defined hazy opacity at the right lung base. A subtle infiltrates possible. Recommend follow-up. Overlapping cardiac leads. IMPRESSION: Slight ill-defined hazy opacity in the right lung base, possible subtle infiltrate. Recommend follow-up Electronically Signed   By: Karen Kays M.D.   On: 04/15/2023 18:08      Signature  -   Susa Raring M.D on 04/18/2023 at 8:12 AM   -  To page go to [email or web address]

## 2023-04-18 NOTE — TOC CM/SW Note (Signed)
Transition of Care [facility]) - Inpatient Brief Assessment   Patient Details  Name: [name] MRN: [id number] Date of Birth: 1946/09/17  [facility] CM/SW Contact:    Mearl Latin, LCSW Phone Number: 04/18/2023, 4:52 PM   Clinical Narrative: Patient admitted from home alone with support of her son. TOC following to set up home health services. Patient was previously set up to go to Cardiac Rehab.    Transition of Care Asessment: Insurance and Status: Insurance coverage has been reviewed Patient has primary care physician: Yes Home environment has been reviewed: From home Prior level of function:: Independent Prior/Current Home Services: No current home services (Was scheduled to go to Cardiac Rehab) Social Determinants of Health Reivew: SDOH reviewed no interventions necessary Readmission risk has been reviewed: Yes Transition of care needs: transition of care needs identified, TOC will continue to follow

## 2023-04-18 NOTE — Progress Notes (Signed)
Occupational Therapy Treatment Patient Details Name: [name] MRN: [id number] DOB: 09-02-1946 Today's Date: 04/18/2023   History of present illness Pt is a 77 yo female admitted on 04/15/23 with fall , unknown time on floor, covered in feces and urine.  She was found to have extreme dehydration, rhabdomyolysis, AKI, encephalopathy, UTI and possible PNE.  Pt with hx including but not limited to CAD, diabetes, hypertension, previous stroke, and peripheral arterial disease   OT comments  Pt speaking to OT in hallway to remind that she needed a shampoo cap to get EEG glue from her hair. Assisted pt with shampoo cap, combing back of head, toileting and standing at sink to wash hands after toileting. Pt ambulated with min guard assist and RW. Pt states she plans to hire help at home for IADLs. Recommended pt consider medical alert system as she lives alone.   Recommendations for follow up therapy are one component of a multi-disciplinary discharge planning process, led by the attending physician.  Recommendations may be updated based on patient status, additional functional criteria and insurance authorization.    Assistance Recommended at Discharge Frequent or constant Supervision/Assistance  Patient can return home with the following  A little help with walking and/or transfers;A little help with bathing/dressing/bathroom;Assistance with cooking/housework;Assist for transportation;Help with stairs or ramp for entrance   Equipment Recommendations  None recommended by OT    Recommendations for Other Services      Precautions / Restrictions Precautions Precautions: Fall Restrictions Weight Bearing Restrictions: No       Mobility Bed Mobility Overal bed mobility: Modified Independent                  Transfers Overall transfer level: Needs assistance Equipment used: Rolling walker (2 wheels) Transfers: Sit to/from Stand Sit to Stand: Min guard                 Balance      Sitting balance-Leahy Scale: Good     Standing balance support: No upper extremity supported Standing balance-Leahy Scale: Fair Standing balance comment: fiar static, poor dynamic                           ADL either performed or assessed with clinical judgement   ADL Overall ADL's : Needs assistance/impaired     Grooming: Wash/dry hands;Standing;Min guard;Brushing hair;Sitting;Minimal assistance Grooming Details (indicate cue type and reason): used shampoo cap, assisted with combing back of head (EEG glue) still in hair                 Toilet Transfer: Min guard;Stand-pivot;BSC/3in1   Toileting- Clothing Manipulation and Hygiene: Minimal assistance;Sit to/from stand Toileting - Clothing Manipulation Details (indicate cue type and reason): assist for posterior pericare for thoroughness     Functional mobility during ADLs: Min guard;Rolling walker (2 wheels) (to sink) General ADL Comments: educated in pursed lip breathing    Extremity/Trunk Assessment              Vision       Perception     Praxis      Cognition Arousal/Alertness: Awake/alert Behavior During Therapy: WFL for tasks assessed/performed Overall Cognitive Status: Within Functional Limits for tasks assessed                                 General Comments: reports her daughter in law manages her meds, may  have baseline deficits in cognition        Exercises      Shoulder Instructions       General Comments      Pertinent Vitals/ Pain       Pain Assessment Pain Assessment: No/denies pain  Home Living                                          Prior Functioning/Environment              Frequency  Min 1X/week        Progress Toward Goals  OT Goals(current goals can now be found in the care plan section)  Progress towards OT goals: Progressing toward goals  Acute Rehab OT Goals OT Goal Formulation: With patient Time For  Goal Achievement: 05/01/23 Potential to Achieve Goals: Good  Plan Discharge plan remains appropriate    Co-evaluation                 AM-PAC OT "6 Clicks" Daily Activity     Outcome Measure   Help from another person eating meals?: None Help from another person taking care of personal grooming?: A Little Help from another person toileting, which includes using toliet, bedpan, or urinal?: A Little Help from another person bathing (including washing, rinsing, drying)?: A Little Help from another person to put on and taking off regular upper body clothing?: None Help from another person to put on and taking off regular lower body clothing?: A Little 6 Click Score: 20    End of Session Equipment Utilized During Treatment: Gait belt;Rolling walker (2 wheels)  OT Visit Diagnosis: Unsteadiness on feet (R26.81);Other abnormalities of gait and mobility (R26.89);Muscle weakness (generalized) (M62.81)   Activity Tolerance Patient limited by fatigue   Patient Left in bed;with call bell/phone within reach   Nurse Communication          Time: 4098-1191 OT Time Calculation (min): 25 min  Charges: OT General Charges $OT Visit: 1 Visit OT Treatments $Self Care/Home Management : 23-37 mins  Berna Spare, OTR/L Acute Rehabilitation Services Office: [phone]   Evern Bio 04/18/2023, 3:56 PM

## 2023-04-18 NOTE — Progress Notes (Signed)
Occupational Therapy Treatment Patient Details Name: [name] MRN: [id number] DOB: 1946/02/21 Today's Date: 04/18/2023   History of present illness Pt is a 77 yo female admitted on 04/15/23 with fall , unknown time on floor, covered in feces and urine.  She was found to have extreme dehydration, rhabdomyolysis, AKI, encephalopathy, UTI and possible PNE.  Pt with hx including but not limited to CAD, diabetes, hypertension, previous stroke, and peripheral arterial disease   OT comments  Patient making good progress with OT treatment with supervision for supine to sitting on EOB and min guard with HHA for transfers to [facility], EOB, and recliner. Patient able to perform toilet hygiene with supervision. Fall prevention reviewed with patient. Discharge recommendations continue to be appropriate for HHOT. Acue OT to continue to follow.    Recommendations for follow up therapy are one component of a multi-disciplinary discharge planning process, led by the attending physician.  Recommendations may be updated based on patient status, additional functional criteria and insurance authorization.    Assistance Recommended at Discharge Frequent or constant Supervision/Assistance  Patient can return home with the following  A little help with walking and/or transfers;A little help with bathing/dressing/bathroom;Assistance with cooking/housework;Assist for transportation;Help with stairs or ramp for entrance   Equipment Recommendations  None recommended by OT    Recommendations for Other Services      Precautions / Restrictions Precautions Precautions: Fall Restrictions Weight Bearing Restrictions: No       Mobility Bed Mobility Overal bed mobility: Needs Assistance Bed Mobility: Supine to Sit     Supine to sit: Supervision     General bed mobility comments: verbal cue to initiate    Transfers Overall transfer level: Needs assistance Equipment used: 1 person hand held assist Transfers: Sit  to/from Stand, Bed to chair/wheelchair/BSC Sit to Stand: Min guard     Step pivot transfers: Min guard     General transfer comment: min assist for transfer to BSC, EOB and recliner     Balance Overall balance assessment: Needs assistance Sitting-balance support: No upper extremity supported Sitting balance-Leahy Scale: Good     Standing balance support: Single extremity supported, Bilateral upper extremity supported Standing balance-Leahy Scale: Poor Standing balance comment: REquiring UE support                           ADL either performed or assessed with clinical judgement   ADL Overall ADL's : Needs assistance/impaired     Grooming: Wash/dry hands;Wash/dry face;Oral care;Set up;Sitting Grooming Details (indicate cue type and reason): on EOB                 Toilet Transfer: Min Agricultural consultant Details (indicate cue type and reason): HHA to transfer to [facility] Toileting- Clothing Manipulation and Hygiene: Set up;Sitting/lateral lean              Extremity/Trunk Assessment              Vision       Perception     Praxis      Cognition Arousal/Alertness: Awake/alert Behavior During Therapy: WFL for tasks assessed/performed Overall Cognitive Status: Impaired/Different from baseline                                 General Comments: followed directions, pleasant        Exercises      Shoulder Instructions  General Comments reviewed fall prevention    Pertinent Vitals/ Pain       Pain Assessment Pain Assessment: No/denies pain  Home Living                                          Prior Functioning/Environment              Frequency  Min 1X/week        Progress Toward Goals  OT Goals(current goals can now be found in the care plan section)  Progress towards OT goals: Progressing toward goals  Acute Rehab OT Goals OT Goal Formulation: With patient Time For  Goal Achievement: 05/01/23 Potential to Achieve Goals: Good ADL Goals Pt Will Perform Grooming: with modified independence;standing Pt Will Perform Lower Body Bathing: with modified independence;sit to/from stand Pt Will Perform Lower Body Dressing: with modified independence;sit to/from stand Pt Will Transfer to Toilet: with modified independence;ambulating;regular height toilet Pt Will Perform Toileting - Clothing Manipulation and hygiene: with modified independence;sit to/from stand Pt Will Perform Tub/Shower Transfer: Tub transfer;with supervision;shower seat;ambulating;rolling walker Additional ADL Goal #1: Pt will state at least 3 fall prevention strategies as instructed.  Plan Discharge plan remains appropriate    Co-evaluation                 AM-PAC OT "6 Clicks" Daily Activity     Outcome Measure   Help from another person eating meals?: None Help from another person taking care of personal grooming?: A Little Help from another person toileting, which includes using toliet, bedpan, or urinal?: A Little Help from another person bathing (including washing, rinsing, drying)?: A Little Help from another person to put on and taking off regular upper body clothing?: A Little Help from another person to put on and taking off regular lower body clothing?: A Little 6 Click Score: 19    End of Session Equipment Utilized During Treatment: Gait belt  OT Visit Diagnosis: Unsteadiness on feet (R26.81);Other abnormalities of gait and mobility (R26.89);Muscle weakness (generalized) (M62.81)   Activity Tolerance Patient tolerated treatment well   Patient Left in chair;with call bell/phone within reach;with chair alarm set   Nurse Communication Mobility status        Time: 4098-1191 OT Time Calculation (min): 24 min  Charges: OT General Charges $OT Visit: 1 Visit OT Treatments $Self Care/Home Management : 23-37 mins  Alfonse Flavors, OTA Acute Rehabilitation Services  Office  [phone]   Dewain Penning 04/18/2023, 10:45 AM

## 2023-04-19 DIAGNOSIS — R410 Disorientation, unspecified: Secondary | ICD-10-CM | POA: Diagnosis not present

## 2023-04-19 LAB — CBC WITH DIFFERENTIAL/PLATELET
Abs Immature Granulocytes: 0.09 10*3/uL — ABNORMAL HIGH (ref 0.00–0.07)
Basophils Absolute: 0 10*3/uL (ref 0.0–0.1)
Basophils Relative: 1 %
Eosinophils Absolute: 0.3 10*3/uL (ref 0.0–0.5)
Eosinophils Relative: 5 %
HCT: 29.5 % — ABNORMAL LOW (ref 36.0–46.0)
Hemoglobin: 9.6 g/dL — ABNORMAL LOW (ref 12.0–15.0)
Immature Granulocytes: 1 %
Lymphocytes Relative: 21 %
Lymphs Abs: 1.5 10*3/uL (ref 0.7–4.0)
MCH: 29.8 pg (ref 26.0–34.0)
MCHC: 32.5 g/dL (ref 30.0–36.0)
MCV: 91.6 fL (ref 80.0–100.0)
Monocytes Absolute: 0.6 10*3/uL (ref 0.1–1.0)
Monocytes Relative: 8 %
Neutro Abs: 4.7 10*3/uL (ref 1.7–7.7)
Neutrophils Relative %: 64 %
Platelets: 190 10*3/uL (ref 150–400)
RBC: 3.22 MIL/uL — ABNORMAL LOW (ref 3.87–5.11)
RDW: 13.7 % (ref 11.5–15.5)
WBC: 7.3 10*3/uL (ref 4.0–10.5)
nRBC: 0 % (ref 0.0–0.2)

## 2023-04-19 LAB — COMPREHENSIVE METABOLIC PANEL
ALT: 38 U/L (ref 0–44)
AST: 36 U/L (ref 15–41)
Albumin: 2.3 g/dL — ABNORMAL LOW (ref 3.5–5.0)
Alkaline Phosphatase: 95 U/L (ref 38–126)
Anion gap: 9 (ref 5–15)
BUN: 39 mg/dL — ABNORMAL HIGH (ref 8–23)
CO2: 19 mmol/L — ABNORMAL LOW (ref 22–32)
Calcium: 8.6 mg/dL — ABNORMAL LOW (ref 8.9–10.3)
Chloride: 107 mmol/L (ref 98–111)
Creatinine, Ser: 2.13 mg/dL — ABNORMAL HIGH (ref 0.44–1.00)
GFR, Estimated: 24 mL/min — ABNORMAL LOW (ref >60)
Glucose, Bld: 93 mg/dL (ref 70–99)
Potassium: 3.9 mmol/L (ref 3.5–5.1)
Sodium: 135 mmol/L (ref 135–145)
Total Bilirubin: 0.5 mg/dL (ref 0.3–1.2)
Total Protein: 5.4 g/dL — ABNORMAL LOW (ref 6.5–8.1)

## 2023-04-19 LAB — MAGNESIUM: Magnesium: 1.4 mg/dL — ABNORMAL LOW (ref 1.7–2.4)

## 2023-04-19 LAB — BRAIN NATRIURETIC PEPTIDE: B Natriuretic Peptide: 638.2 pg/mL — ABNORMAL HIGH (ref 0.0–100.0)

## 2023-04-19 LAB — CULTURE, BLOOD (ROUTINE X 2)

## 2023-04-19 LAB — GLUCOSE, CAPILLARY
Glucose-Capillary: 134 mg/dL — ABNORMAL HIGH (ref 70–99)
Glucose-Capillary: 95 mg/dL (ref 70–99)
Glucose-Capillary: 106 mg/dL — ABNORMAL HIGH (ref 70–99)
Glucose-Capillary: 117 mg/dL — ABNORMAL HIGH (ref 70–99)

## 2023-04-19 LAB — PROCALCITONIN: Procalcitonin: 1.86 ng/mL

## 2023-04-19 LAB — C-REACTIVE PROTEIN: CRP: 8 mg/dL — ABNORMAL HIGH (ref <1.0)

## 2023-04-19 MED ORDER — ISOSORBIDE MONONITRATE ER 30 MG PO TB24
30.0000 mg | ORAL_TABLET | Freq: Every day | ORAL | Status: DC
  Administered 2023-04-20: 30 mg via ORAL
  Filled 2023-04-19: qty 1

## 2023-04-19 MED ORDER — MAGNESIUM SULFATE 4 GM/100ML IV SOLN
4.0000 g | Freq: Once | INTRAVENOUS | Status: AC
  Administered 2023-04-19: 4 g via INTRAVENOUS
  Filled 2023-04-19: qty 100

## 2023-04-19 MED ORDER — LACTATED RINGERS IV SOLN: INTRAVENOUS | Status: AC

## 2023-04-19 NOTE — Progress Notes (Signed)
Orthostatic vitals  04/19/23 0802  Vitals  Temp 98 F (36.7 C)  Temp Source Oral  BP (!) 101/45  MAP (mmHg) 95  BP Location Right Arm  BP Method Automatic  Patient Position (if appropriate) Orthostatic Vitals  Pulse Rate 60  Pulse Rate Source Monitor  Resp 18  Level of Consciousness  Level of Consciousness Alert  MEWS COLOR  MEWS Score Color Green  Orthostatic Lying   BP- Lying 101/45  Pulse- Lying 60  Orthostatic Sitting  BP- Sitting 113/60  Pulse- Sitting 76  Orthostatic Standing at 0 minutes  BP- Standing at 0 minutes 106/53  Pulse- Standing at 0 minutes 66  Orthostatic Standing at 3 minutes  BP- Standing at 3 minutes 116/52  Pulse- Standing at 3 minutes 70  Oxygen Therapy  SpO2 99 %  O2 Device Room Air  Glasgow Coma Scale  Eye Opening 4  Best Verbal Response (NON-intubated) 5  Best Motor Response 6  Glasgow Coma Scale Score 15  MEWS Score  MEWS Temp 0  MEWS Systolic 0  MEWS Pulse 0  MEWS RR 0  MEWS LOC 0  MEWS Score 0

## 2023-04-19 NOTE — Progress Notes (Addendum)
PROGRESS NOTE                                                                                                                                                                                                             Patient Demographics:    [name], is a 77 y.o. female, DOB - Jul 05, 1946, FAO:[id number]  Outpatient Primary MD for the patient is Garlan Fillers, MD    LOS - 4  Admit date - 04/15/2023    Chief Complaint  Patient presents with   Altered Mental Status       Brief Narrative (HPI from H&P)   77 y.o. female with medical history significant of living by herself.  Per report patient is typically alert awake and fully oriented.  Unfortunately per report, patient was not heard from in the last 48 hours, a wellness check was performed and the patient was found in her home on the ground covered in urine and feces.  Patient is brought to [facility] ER.  In the ER workup was consistent with extreme dehydration, rhabdomyolysis, AKI, UTI and possible pneumonia and she was admitted.   Subjective:   Patient in bed, appears comfortable, denies any headache, no fever, no chest pain or pressure, no shortness of breath , no abdominal pain. No new focal weakness.    Assessment  & Plan :    Acute metabolic encephalopathy, fall, rhabdomyolysis, dehydration, AKI. She was found down for unclear number of hours, last seen normal about 2 days prior to the episode, CT head and C-spine nonacute, she certainly has severe dehydration, rhabdo and AKI, continue IV fluids, antibiotics for UTI and pneumonia.  Advance activity with PT OT, monitor.  Stable EEG, mentation improving. Continue to monitor with PT OT and advancing activity likely discharge in the next 1 to 2 days.  Pneumonia.  Aspiration likely, UTI.  Continue on present empiric antibiotics, responding well, follow cultures.  Speech to evaluate.  Rhabdomyolysis - see #1  Acute  kidney injury superimposed on CKD 4 with baseline creatinine of close to 2- continue hydration with IV fluids, agree with sodium bicarb.  Monitor bladder scans, able renal ultrasound on 04/16/2023.  Renal function stabilizing and coming close to her baseline.  Asymptomatic transaminitis, fatty liver - transaminitis due to rhabdo, ultrasound suggest fatty liver, trend LFTs and outpatient GI follow-up.  Chronic pancreatitis (HCC)  -  home dose of pancreatic enzymes  Accelerated hypertension  - BP stable however now little symptomatic upon standing up although supine pressures are stable, blood pressure medications adjusted further on 04/19/2023, will allow mild permissive hypertension with systolic between 1 40-1 50s, apply TED stockings and monitor orthostatics.  Hypomagnesemia.  Replace.       Condition - Fair  Family Communication  :    Called son Fayrene Fearing [phone]  on 04/16/2023 at 7:40 AM and message left  Called daughter Toniann Fail [phone] on 04/16/2023 and updated  Code Status :  Full  Consults  :  None  PUD Prophylaxis :  PPI   Procedures  :     CT head and C-spine.  Nonacute.  Right upper quadrant ultrasound.  Fatty liver  Renal US.  Nonacute  EEG.  Nonspecific but no active seizures      Disposition Plan  :    Status is: Inpatient  DVT Prophylaxis  :    enoxaparin (LOVENOX) injection 30 mg Start: 04/17/23 1000 SCD's Start: 04/15/23 2102  Lab Results  Component Value Date   PLT 190 04/19/2023    Diet :  Diet Order             DIET SOFT Fluid consistency: Thin  Diet effective now                    Inpatient Medications  Scheduled Meds:  amLODipine  10 mg Oral Daily   aspirin  81 mg Oral Daily   atorvastatin  40 mg Oral Daily   cloNIDine  0.1 mg Oral TID   clopidogrel  75 mg Oral QPM   enoxaparin (LOVENOX) injection  30 mg Subcutaneous Q24H   haloperidol lactate  2 mg Intravenous Once   hydrALAZINE  50 mg Oral Q8H   insulin aspart  0-5 Units  Subcutaneous QHS   insulin aspart  0-9 Units Subcutaneous TID WC   isosorbide mononitrate  60 mg Oral Daily   levETIRAcetam  500 mg Oral BID   lipase/protease/amylase  36,000 Units Oral BID WC   metoprolol succinate  100 mg Oral Daily   montelukast  10 mg Oral Daily   pantoprazole  40 mg Oral BID   sodium bicarbonate  650 mg Oral QID   venlafaxine XR  75 mg Oral Daily   Continuous Infusions:  cefTRIAXone (ROCEPHIN)  IV 1 g (04/18/23 2132)   magnesium sulfate bolus IVPB 4 g (04/19/23 0657)   PRN Meds:.acetaminophen **OR** acetaminophen (TYLENOL) oral liquid 160 mg/5 mL **OR** acetaminophen, albuterol, hydrALAZINE, HYDROcodone-acetaminophen, labetalol, loperamide, LORazepam, senna-docusate    Objective:   Vitals:   04/19/23 0000 04/19/23 0300 04/19/23 0657 04/19/23 0800  BP: 130/63 137/67 127/62 (!) 104/50  Pulse: 63 60  75  Resp: 14 15  19   Temp: (!) 97.5 F (36.4 C) (!) 97.5 F (36.4 C)  98 F (36.7 C)  TempSrc: Oral Oral  Oral  SpO2: 97% 98%  95%  Weight:  79.5 kg    Height:        Wt Readings from Last 3 Encounters:  04/19/23 79.5 kg  03/31/23 75.8 kg  02/18/23 78.1 kg    No intake or output data in the 24 hours ending 04/19/23 0839    Physical Exam  Awake, much more alert today oriented x 3, no new F.N deficits, Normal affect .AT,PERRAL Supple Neck, No JVD,   Symmetrical Chest wall movement, Good air movement bilaterally, CTAB  RRR,No Gallops,Rubs or new Murmurs,  +ve B.Sounds, Abd Soft, No tenderness,   No Cyanosis, Clubbing or edema       Data Review:    Recent Labs  Lab 04/15/23 1730 04/15/23 2159 04/16/23 0546 04/17/23 0155 04/18/23 0412 04/19/23 0259  WBC 19.5* 16.8* 15.2* 8.9 5.9 7.3  HGB 11.7* 10.1* 10.5* 8.6* 10.4* 9.6*  HCT 36.5 31.4* 31.3* 27.1* 32.2* 29.5*  PLT 205 169 150 166 168 190  MCV 93.1 93.5 93.2 92.5 94.7 91.6  MCH 29.8 30.1 31.3 29.4 30.6 29.8  MCHC 32.1 32.2 33.5 31.7 32.3 32.5  RDW 13.5 13.4 13.5 13.8 13.6 13.7   LYMPHSABS 0.4*  --   --  0.9 1.1 1.5  MONOABS 0.4  --   --  0.5 0.5 0.6  EOSABS 0.0  --   --  0.5 0.4 0.3  BASOSABS 0.1  --   --  0.0 0.0 0.0    Recent Labs  Lab 04/15/23 1730 04/15/23 1808 04/15/23 2000 04/15/23 2159 04/15/23 2328 04/16/23 0335 04/16/23 0546 04/17/23 0155 04/18/23 0412 04/19/23 0259  NA 135  --   --   --   --   --  134* 134* 135 135  K 3.8  --   --   --   --   --  3.7 3.8 3.6 3.9  CL 105  --   --   --   --   --  108 108 108 107  CO2 14*  --   --   --   --   --  15* 17* 17* 19*  ANIONGAP 16*  --   --   --   --   --  11 9 10 9   GLUCOSE 169*  --   --   --   --   --  108* 98 91 93  BUN 56*  --   --   --   --   --  51* 54* 44* 39*  CREATININE 4.00*  --   --  3.59*  --   --  3.24* 3.02* 2.47* 2.13*  AST 171*  --   --   --   --  111*  --  63* 53* 36  ALT 69*  --   --   --   --  54*  --  42 43 38  ALKPHOS 101  --   --   --   --  92  --  84 108 95  BILITOT 0.9  --   --   --   --  0.6  --  0.4 0.2* 0.5  ALBUMIN 3.0*  --   --   --   --  2.4*  --  2.1* 2.3* 2.3*  CRP  --   --   --   --   --   --  22.5* 20.9* 13.0* 8.0*  PROCALCITON  --   --   --   --   --   --  12.10 6.10 3.04 1.86  LATICACIDVEN  --  2.8* 2.7* 1.8 2.3*  --   --   --   --   --   INR  --   --   --   --   --  1.3*  --   --   --   --   TSH  --   --   --  1.013  --   --  0.803  --   --   --  AMMONIA  --   --   --   --   --   --  16  --   --   --   BNP  --   --   --   --   --   --  505.6* 267.3* 847.8* 638.2*  MG  --   --   --   --   --   --  1.1* 2.4 1.7 1.4*  CALCIUM 9.3  --   --   --   --   --  8.2* 8.3* 8.5* 8.6*      Recent Labs  Lab 04/15/23 1730 04/15/23 1808 04/15/23 2000 04/15/23 2159 04/15/23 2328 04/16/23 0335 04/16/23 0546 04/17/23 0155 04/18/23 0412 04/19/23 0259  CRP  --   --   --   --   --   --  22.5* 20.9* 13.0* 8.0*  PROCALCITON  --   --   --   --   --   --  12.10 6.10 3.04 1.86  LATICACIDVEN  --  2.8* 2.7* 1.8 2.3*  --   --   --   --   --   INR  --   --   --   --   --   1.3*  --   --   --   --   TSH  --   --   --  1.013  --   --  0.803  --   --   --   AMMONIA  --   --   --   --   --   --  16  --   --   --   BNP  --   --   --   --   --   --  505.6* 267.3* 847.8* 638.2*  MG  --   --   --   --   --   --  1.1* 2.4 1.7 1.4*  CALCIUM 9.3  --   --   --   --   --  8.2* 8.3* 8.5* 8.6*   Lab Results  Component Value Date   CHOL 104 04/16/2023   HDL 30 (L) 04/16/2023   LDLCALC 46 04/16/2023   TRIG 138 04/16/2023   CHOLHDL 3.5 04/16/2023    Lab Results  Component Value Date   HGBA1C 6.5 (H) 01/02/2023     Radiology Reports US RENAL  Result Date: 04/16/2023 CLINICAL DATA:  Acute kidney injury EXAM: RENAL / URINARY TRACT ULTRASOUND COMPLETE COMPARISON:  CT abdomen pelvis 01/02/2023 FINDINGS: Right Kidney: Renal measurements: 9.2 x 4.2 x 4.9 cm = volume: 111 mL. Echogenicity within normal limits. No mass or hydronephrosis visualized. Left Kidney: Renal measurements: 9.5 x 5.6 x 5.3 cm = volume: 146 mL. Echogenicity within normal limits. No mass or hydronephrosis visualized. 1.0 cm simple cyst at the lower pole does not require dedicated imaging surveillance. Bladder: Appears normal for degree of bladder distention. Other: Diffuse increased echogenicity of the visualized portions of the hepatic parenchyma are a nonspecific indicator of hepatocellular dysfunction, most commonly steatosis. IMPRESSION: No significant sonographic abnormality of the kidneys. Electronically Signed   By: Acquanetta Belling M.D.   On: 04/16/2023 13:54   EEG adult  Result Date: 04/16/2023 Charlsie Quest, MD     04/16/2023 12:16 PM Patient Name: JALAIYAH VIVIANI MRN: [id number] Epilepsy Attending: Charlsie Quest Referring Physician/Provider: Nolberto Hanlon, MD Date: 04/16/2023 Duration: 23.30 mins Patient history: 76yo f with ams getting  eeg to evaluate for seizure. Level of alertness: Awake AEDs during EEG study: LEV Technical aspects: This EEG study was done with scalp electrodes positioned according to the  10-20 International system of electrode placement. Electrical activity was reviewed with band pass filter of 1-70Hz , sensitivity of 7 uV/mm, display speed of 29mm/sec with a 60Hz  notched filter applied as appropriate. EEG data were recorded continuously and digitally stored.  Video monitoring was available and reviewed as appropriate. Description: The posterior dominant rhythm consists of 7.5 Hz activity of moderate voltage (25-35 uV) seen predominantly in posterior head regions, symmetric and reactive to eye opening and eye closing. Hyperventilation and photic stimulation were not performed.   IMPRESSION: This study is within normal limits. No seizures or epileptiform discharges were seen throughout the recording. A normal interictal EEG does not exclude the diagnosis of epilepsy. Charlsie Quest   DG Chest Port 1 View  Result Date: 04/16/2023 CLINICAL DATA:  Shortness of breath. EXAM: PORTABLE CHEST 1 VIEW COMPARISON:  04/15/2023 FINDINGS: Stable cardiomediastinal contours. Aortic atherosclerotic calcifications. Decreased lung volumes. Increase interstitial and airspace opacities within the right lung compared with the previous exam. Left lung appears clear. Age indeterminate deformity involving the greater tuberosity of the left humeral head which appears new compared with 12/25/2021. IMPRESSION: 1. Increased interstitial and airspace opacities within the right lung compared with the previous exam. 2. Age indeterminate deformity involving the greater tuberosity of the left humeral head which appears new compared with 12/25/2021. Correlate for any clinical signs or symptoms of pain within this area. If the patient is symptomatic consider further evaluation with dedicated radiographs of the left shoulder. Electronically Signed   By: Signa Kell M.D.   On: 04/16/2023 07:01   CT Head Wo Contrast  Result Date: 04/15/2023 CLINICAL DATA:  Head and neck trauma EXAM: CT HEAD WITHOUT CONTRAST CT CERVICAL SPINE  WITHOUT CONTRAST TECHNIQUE: Multidetector CT imaging of the head and cervical spine was performed following the standard protocol without intravenous contrast. Multiplanar CT image reconstructions of the cervical spine were also generated. RADIATION DOSE REDUCTION: This exam was performed according to the departmental dose-optimization program which includes automated exposure control, adjustment of the mA and/or kV according to patient size and/or use of iterative reconstruction technique. COMPARISON:  01/02/2023 CT head, 04/03/2022 CTA neck, no prior CT cervical spine available FINDINGS: CT HEAD FINDINGS Evaluation is somewhat limited by motion artifact. Brain: No evidence of acute infarct, hemorrhage, mass, mass effect, or midline shift. No hydrocephalus or extra-axial fluid collection. Periventricular white matter changes, likely the sequela of chronic small vessel ischemic disease. Vascular: No hyperdense vessel. Atherosclerotic calcifications in the intracranial carotid and vertebral arteries. Skull: Negative for fracture or focal lesion. Sinuses/Orbits: No acute finding. CT CERVICAL SPINE FINDINGS Alignment: No traumatic listhesis. Skull base and vertebrae: No acute fracture or suspicious osseous lesion. Soft tissues and spinal canal: No prevertebral fluid or swelling. No visible canal hematoma. Disc levels: Degenerative changes in the cervical spine. Moderate spinal canal stenosis at C5-C6 and mild spinal canal stenosis at C6-C7. Upper chest: Ground-glass opacities in the imaged right lung (series 5, image 76). No pleural effusion. Aortic atherosclerosis. IMPRESSION: 1. No acute intracranial process. 2. No acute fracture or traumatic listhesis in the cervical spine. 3. Ground-glass opacities in the imaged right lung, which could be infectious or inflammatory. 4. Aortic atherosclerosis. Aortic Atherosclerosis (ICD10-I70.0). Electronically Signed   By: Wiliam Ke M.D.   On: 04/15/2023 19:59   CT Cervical  Spine Wo Contrast  Result Date: 04/15/2023 CLINICAL DATA:  Head and neck trauma EXAM: CT HEAD WITHOUT CONTRAST CT CERVICAL SPINE WITHOUT CONTRAST TECHNIQUE: Multidetector CT imaging of the head and cervical spine was performed following the standard protocol without intravenous contrast. Multiplanar CT image reconstructions of the cervical spine were also generated. RADIATION DOSE REDUCTION: This exam was performed according to the departmental dose-optimization program which includes automated exposure control, adjustment of the mA and/or kV according to patient size and/or use of iterative reconstruction technique. COMPARISON:  01/02/2023 CT head, 04/03/2022 CTA neck, no prior CT cervical spine available FINDINGS: CT HEAD FINDINGS Evaluation is somewhat limited by motion artifact. Brain: No evidence of acute infarct, hemorrhage, mass, mass effect, or midline shift. No hydrocephalus or extra-axial fluid collection. Periventricular white matter changes, likely the sequela of chronic small vessel ischemic disease. Vascular: No hyperdense vessel. Atherosclerotic calcifications in the intracranial carotid and vertebral arteries. Skull: Negative for fracture or focal lesion. Sinuses/Orbits: No acute finding. CT CERVICAL SPINE FINDINGS Alignment: No traumatic listhesis. Skull base and vertebrae: No acute fracture or suspicious osseous lesion. Soft tissues and spinal canal: No prevertebral fluid or swelling. No visible canal hematoma. Disc levels: Degenerative changes in the cervical spine. Moderate spinal canal stenosis at C5-C6 and mild spinal canal stenosis at C6-C7. Upper chest: Ground-glass opacities in the imaged right lung (series 5, image 76). No pleural effusion. Aortic atherosclerosis. IMPRESSION: 1. No acute intracranial process. 2. No acute fracture or traumatic listhesis in the cervical spine. 3. Ground-glass opacities in the imaged right lung, which could be infectious or inflammatory. 4. Aortic  atherosclerosis. Aortic Atherosclerosis (ICD10-I70.0). Electronically Signed   By: Wiliam Ke M.D.   On: 04/15/2023 19:59   US Abdomen Limited RUQ (LIVER/GB)  Result Date: 04/15/2023 CLINICAL DATA:  Elevated liver enzyme EXAM: ULTRASOUND ABDOMEN LIMITED RIGHT UPPER QUADRANT COMPARISON:  09/12/2016 right upper quadrant ultrasound FINDINGS: Gallbladder: No gallstones or wall thickening visualized. No sonographic Murphy sign noted by sonographer. Common bile duct: Diameter: 2 mm, within normal limits. No intrahepatic biliary ductal dilatation Liver: No focal lesion identified. Increased parenchymal echogenicity. Portal vein is patent on color Doppler imaging with normal direction of blood flow towards the liver. Other: No left lateral decubitus images could be obtained, as the patient asked to terminate the exam. IMPRESSION: Hepatic steatosis. Electronically Signed   By: Wiliam Ke M.D.   On: 04/15/2023 19:48   DG Hip Unilat W or Wo Pelvis 2-3 Views Right  Result Date: 04/15/2023 CLINICAL DATA:  Recent fall with right hip pain, initial EXAM: DG HIP (WITH OR WITHOUT PELVIS) 3V RIGHT COMPARISON:  None Available. FINDINGS: No acute fracture or dislocation is noted. Vascular calcifications are seen. Mild degenerative changes of the hip joints are noted. No soft tissue abnormality is seen. IMPRESSION: No acute abnormality noted. Electronically Signed   By: Alcide Clever M.D.   On: 04/15/2023 19:18   DG Chest Portable 1 View  Result Date: 04/15/2023 CLINICAL DATA:  Pain after fall EXAM: PORTABLE CHEST 1 VIEW COMPARISON:  Chest x-ray 01/02/2023 FINDINGS: No pneumothorax, effusion or edema. Normal cardiopericardial silhouette. Calcified aorta. Slight ill-defined hazy opacity at the right lung base. A subtle infiltrates possible. Recommend follow-up. Overlapping cardiac leads. IMPRESSION: Slight ill-defined hazy opacity in the right lung base, possible subtle infiltrate. Recommend follow-up Electronically Signed    By: Karen Kays M.D.   On: 04/15/2023 18:08      Signature  -   Susa Raring M.D on 04/19/2023 at 8:39 AM   -  To page go to [email or web address]

## 2023-04-19 NOTE — Plan of Care (Signed)

## 2023-04-20 DIAGNOSIS — R410 Disorientation, unspecified: Secondary | ICD-10-CM | POA: Diagnosis not present

## 2023-04-20 LAB — CULTURE, BLOOD (ROUTINE X 2): Culture: NO GROWTH

## 2023-04-20 LAB — GLUCOSE, CAPILLARY
Glucose-Capillary: 146 mg/dL — ABNORMAL HIGH (ref 70–99)
Glucose-Capillary: 110 mg/dL — ABNORMAL HIGH (ref 70–99)

## 2023-04-20 MED ORDER — AMOXICILLIN-POT CLAVULANATE 500-125 MG PO TABS: 1.0000 | ORAL_TABLET | Freq: Two times a day (BID) | ORAL | 0 refills | Status: DC | PRN

## 2023-04-20 MED ORDER — AMLODIPINE BESYLATE 10 MG PO TABS: 10.0000 mg | ORAL_TABLET | Freq: Every day | ORAL | 0 refills | Status: AC

## 2023-04-20 MED ORDER — MAGNESIUM SULFATE IN D5W 1-5 GM/100ML-% IV SOLN
1.0000 g | Freq: Once | INTRAVENOUS | Status: AC
  Administered 2023-04-20: 1 g via INTRAVENOUS
  Filled 2023-04-20: qty 100

## 2023-04-20 MED ORDER — CLONIDINE HCL 0.1 MG PO TABS: 0.1000 mg | ORAL_TABLET | Freq: Two times a day (BID) | ORAL | 0 refills | Status: DC

## 2023-04-20 NOTE — Discharge Summary (Signed)
[name] ZOX:[id number] DOB: [birth year] DOA: 04/15/2023  PCP: Garlan Fillers, MD  Admit date: 04/15/2023  Discharge date: 04/20/2023  Admitted From: Home   Disposition:  Home   Recommendations for Outpatient Follow-up:   Follow up with PCP in 1-2 weeks  PCP Please obtain BMP/CBC, 2 view CXR in 1week,  (see Discharge instructions)   PCP Please follow up on the following pending results: Check magnesium, CMP, CK, CBC and a two-view chest x-ray in 5 to 7 days.  Minimize narcotic use.   Home Health: PT, RM< Aide if qualifies   Equipment/Devices: as below  Consultations: None  Discharge Condition: Stable    CODE STATUS: Full    Diet Recommendation: Heart Healthy     Chief Complaint  Patient presents with   Altered Mental Status     Brief history of present illness from the day of admission and additional interim summary     77 y.o. female with medical history significant of living by herself.  Per report patient is typically alert awake and fully oriented.  Unfortunately per report, patient was not heard from in the last 48 hours, a wellness check was performed and the patient was found in her home on the ground covered in urine and feces.  Patient is brought to [facility] ER.  In the ER workup was consistent with extreme dehydration, rhabdomyolysis, AKI, UTI and possible pneumonia and she was admitted.                                                                  Hospital Course   Acute metabolic encephalopathy, fall, rhabdomyolysis, dehydration, AKI. She was found down for unclear number of hours, last seen normal about 2 days prior to the episode, CT head and C-spine nonacute, she certainly has severe dehydration, rhabdo and AKI, continue IV fluids, antibiotics for UTI and pneumonia.  EEG was stable, no focal  deficits with hydration, antibiotics, PT OT she is back to her baseline eager to go home does not want to go to SNF.  Plan discussed with son who is comfortable with it, will be discharged home on few more days of oral antibiotics, also requested to cut down her narcotics.  Home PT, RN and aide if she qualifies along with a rolling walker.  1/6 Staph contamination in blood cultures per ID.  Pneumonia.  Aspiration likely, UTI.  Continue on present empiric antibiotics, doing very well now, eager to go home, cultures thus far -3 more days of oral Augmentin upon discharge with follow-up with PCP.  Rhabdomyolysis - much improved repeat CK along with CMP in the outpatient setting to make sure her LFTs are coming down nicely.   Acute kidney injury superimposed on CKD 4 with baseline creatinine of close to 2- continue hydration with  IV fluids, agree with sodium bicarb.  Monitor bladder scans, able renal ultrasound on 04/16/2023.  Renal function stabilizing and coming close to her baseline.   Asymptomatic transaminitis, fatty liver - transaminitis due to rhabdo, ultrasound suggest fatty liver, trend LFTs and outpatient GI follow-up if required per PCP.   Chronic pancreatitis (HCC)  -  home dose of pancreatic enzymes   Accelerated hypertension  - BP was a bit labile, medications adjusted with good control, being discharged on below mentioned regimen, request PCP to monitor and adjust   Hypomagnesemia.  Replaced, PCP to recheck next visit.    Discharge diagnosis     Principal Problem:   Confusion Active Problems:   Accelerated hypertension   Chronic pancreatitis (HCC)   Fatty liver   Acute kidney injury superimposed on CKD (HCC)   Acute metabolic encephalopathy   CAP (community acquired pneumonia)   Sinus tachycardia   Rhabdomyolysis    Discharge instructions    Discharge Instructions     Diet - low sodium heart healthy   Complete by: As directed    Discharge instructions   Complete by: As  directed    ollow with Primary MD Garlan Fillers, MD in 7 days   Get CBC, CMP, Magnesium, 2 view Chest X ray -  checked next visit with your primary MD   Activity: As tolerated with Full fall precautions use walker/cane & assistance as needed  Disposition Home    Diet: Heart Healthy    Special Instructions: If you have smoked or chewed Tobacco  in the last 2 yrs please stop smoking, stop any regular Alcohol  and or any Recreational drug use.  On your next visit with your primary care physician please Get Medicines reviewed and adjusted.  Please request your Prim.MD to go over all Hospital Tests and Procedure/Radiological results at the follow up, please get all Hospital records sent to your Prim MD by signing hospital release before you go home.  If you experience worsening of your admission symptoms, develop shortness of breath, life threatening emergency, suicidal or homicidal thoughts you must seek medical attention immediately by calling 911 or calling your MD immediately  if symptoms less severe.  You Must read complete instructions/literature along with all the possible adverse reactions/side effects for all the Medicines you take and that have been prescribed to you. Take any new Medicines after you have completely understood and accpet all the possible adverse reactions/side effects.   Increase activity slowly   Complete by: As directed        Discharge Medications   Allergies as of 04/20/2023       Reactions   Adhesive [tape] Other (See Comments)   "Took off my skin"   Other Rash   "Took off my skin"   Ace Inhibitors Other (See Comments)   Unknown   Sulfa Antibiotics Other (See Comments)   Unknown   Topiramate    unknown   Codeine Other (See Comments)   Altered mental state    Iodinated Contrast Media Rash   Latex Rash        Medication List     STOP taking these medications    colestipol 1 g tablet Commonly known as: Colestid   hydrALAZINE 100 MG  tablet Commonly known as: APRESOLINE   HYDROcodone-acetaminophen 10-325 MG tablet Commonly known as: NORCO   Jardiance 10 MG Tabs tablet Generic drug: empagliflozin   losartan 50 MG tablet Commonly known as: COZAAR  TAKE these medications    acetaminophen 325 MG tablet Commonly known as: TYLENOL Take 650 mg by mouth every 6 (six) hours as needed for mild pain.   albuterol 108 (90 Base) MCG/ACT inhaler Commonly known as: VENTOLIN HFA Inhale 1-2 puffs into the lungs every 6 (six) hours as needed for wheezing or shortness of breath.   amLODipine 10 MG tablet Commonly known as: NORVASC Take 1 tablet (10 mg total) by mouth daily.   amoxicillin-clavulanate 500-125 MG tablet Commonly known as: Augmentin Take 1 tablet by mouth 2 (two) times daily between meals as needed.   aspirin 81 MG chewable tablet Chew 1 tablet (81 mg total) by mouth daily.   atorvastatin 40 MG tablet Commonly known as: LIPITOR Take 1 tablet (40 mg total) by mouth daily.   cloNIDine 0.1 MG tablet Commonly known as: CATAPRES Take 1 tablet (0.1 mg total) by mouth 2 (two) times daily. What changed: when to take this   clopidogrel 75 MG tablet Commonly known as: PLAVIX Take 1 tablet (75 mg total) by mouth daily. What changed: when to take this   [employer] Digestive Probiotic 250 MG capsule Generic drug: saccharomyces boulardii Take 500 mg by mouth daily.   cyanocobalamin 500 MCG tablet Commonly known as: VITAMIN B12 Take 500 mcg by mouth daily.   esomeprazole 40 MG capsule Commonly known as: NEXIUM Take 40 mg by mouth 2 (two) times daily before a meal.   fluticasone 50 MCG/ACT nasal spray Commonly known as: FLONASE Place 2 sprays into both nostrils daily as needed for allergies.   furosemide 20 MG tablet Commonly known as: LASIX Take 1 tablet (20 mg total) by mouth daily as needed. What changed: when to take this   isosorbide mononitrate 60 MG 24 hr tablet Commonly known as: IMDUR Take  1 tablet (60 mg total) by mouth daily.   levETIRAcetam 500 MG tablet Commonly known as: KEPPRA TAKE 1 TABLET BY MOUTH TWICE A DAY   meclizine 25 MG tablet Commonly known as: ANTIVERT Take 0.5 tablets (12.5 mg total) by mouth 3 (three) times daily as needed for dizziness.   metoprolol succinate 100 MG 24 hr tablet Commonly known as: TOPROL-XL Take 100 mg by mouth daily.   mirtazapine 15 MG tablet Commonly known as: REMERON Take 15 mg by mouth at bedtime.   montelukast 10 MG tablet Commonly known as: SINGULAIR Take 10 mg by mouth daily.   nitroGLYCERIN 0.4 MG SL tablet Commonly known as: NITROSTAT Place 1 tablet (0.4 mg total) under the tongue every 5 (five) minutes as needed for chest pain.   ondansetron 4 MG tablet Commonly known as: ZOFRAN Take 1 tablet (4 mg total) by mouth every 6 (six) hours as needed for nausea or vomiting.   pantoprazole 40 MG tablet Commonly known as: PROTONIX Take 1 tablet (40 mg total) by mouth 2 (two) times daily.   Polysaccharide-Iron Complex 150 MG Caps Take 1 tablet by mouth daily.   venlafaxine XR 75 MG 24 hr capsule Commonly known as: EFFEXOR-XR Take 1 capsule (75 mg total) by mouth daily.   Vitamin D (Ergocalciferol) 1.25 MG (50000 UNIT) Caps capsule Commonly known as: DRISDOL Take 50,000 Units by mouth every Wednesday.   Zenpep 40000-126000 units Cpep Generic drug: Pancrelipase (Lip-Prot-Amyl) Take 2 capsules with meals and one capsule with snacks. What changed:  how much to take how to take this when to take this additional instructions  Durable Medical Equipment  (From admission, onward)           Start     Ordered   04/20/23 0747  For home use only DME Walker rolling  Once       Comments: 5 wheel  Question Answer Comment  Walker: With 5 Inch Wheels   Patient needs a walker to treat with the following condition Weakness      04/20/23 0746             Follow-up Information      Garlan Fillers, MD. Schedule an appointment as soon as possible for a visit in 1 week(s).   Specialty: Internal Medicine Contact information: [address] [place] [address] [phone]                 Major procedures and Radiology Reports - PLEASE review detailed and final reports thoroughly  -     US RENAL  Result Date: 04/16/2023 CLINICAL DATA:  Acute kidney injury EXAM: RENAL / URINARY TRACT ULTRASOUND COMPLETE COMPARISON:  CT abdomen pelvis 01/02/2023 FINDINGS: Right Kidney: Renal measurements: 9.2 x 4.2 x 4.9 cm = volume: 111 mL. Echogenicity within normal limits. No mass or hydronephrosis visualized. Left Kidney: Renal measurements: 9.5 x 5.6 x 5.3 cm = volume: 146 mL. Echogenicity within normal limits. No mass or hydronephrosis visualized. 1.0 cm simple cyst at the lower pole does not require dedicated imaging surveillance. Bladder: Appears normal for degree of bladder distention. Other: Diffuse increased echogenicity of the visualized portions of the hepatic parenchyma are a nonspecific indicator of hepatocellular dysfunction, most commonly steatosis. IMPRESSION: No significant sonographic abnormality of the kidneys. Electronically Signed   By: Acquanetta Belling M.D.   On: 04/16/2023 13:54   EEG adult  Result Date: 04/16/2023 Charlsie Quest, MD     04/16/2023 12:16 PM Patient Name: [name] MRN: [id number] Epilepsy Attending: Charlsie Quest Referring Physician/Provider: Nolberto Hanlon, MD Date: 04/16/2023 Duration: 23.30 mins Patient history: 76yo f with ams getting eeg to evaluate for seizure. Level of alertness: Awake AEDs during EEG study: LEV Technical aspects: This EEG study was done with scalp electrodes positioned according to the 10-20 International system of electrode placement. Electrical activity was reviewed with band pass filter of 1-70Hz , sensitivity of 7 uV/mm, display speed of 68mm/sec with a 60Hz  notched filter applied as appropriate. EEG data were recorded  continuously and digitally stored.  Video monitoring was available and reviewed as appropriate. Description: The posterior dominant rhythm consists of 7.5 Hz activity of moderate voltage (25-35 uV) seen predominantly in posterior head regions, symmetric and reactive to eye opening and eye closing. Hyperventilation and photic stimulation were not performed.   IMPRESSION: This study is within normal limits. No seizures or epileptiform discharges were seen throughout the recording. A normal interictal EEG does not exclude the diagnosis of epilepsy. Charlsie Quest   DG Chest Port 1 View  Result Date: 04/16/2023 CLINICAL DATA:  Shortness of breath. EXAM: PORTABLE CHEST 1 VIEW COMPARISON:  04/15/2023 FINDINGS: Stable cardiomediastinal contours. Aortic atherosclerotic calcifications. Decreased lung volumes. Increase interstitial and airspace opacities within the right lung compared with the previous exam. Left lung appears clear. Age indeterminate deformity involving the greater tuberosity of the left humeral head which appears new compared with 12/25/2021. IMPRESSION: 1. Increased interstitial and airspace opacities within the right lung compared with the previous exam. 2. Age indeterminate deformity involving the greater tuberosity of the left humeral head which appears new compared with  12/25/2021. Correlate for any clinical signs or symptoms of pain within this area. If the patient is symptomatic consider further evaluation with dedicated radiographs of the left shoulder. Electronically Signed   By: Signa Kell M.D.   On: 04/16/2023 07:01   CT Head Wo Contrast  Result Date: 04/15/2023 CLINICAL DATA:  Head and neck trauma EXAM: CT HEAD WITHOUT CONTRAST CT CERVICAL SPINE WITHOUT CONTRAST TECHNIQUE: Multidetector CT imaging of the head and cervical spine was performed following the standard protocol without intravenous contrast. Multiplanar CT image reconstructions of the cervical spine were also generated.  RADIATION DOSE REDUCTION: This exam was performed according to the departmental dose-optimization program which includes automated exposure control, adjustment of the mA and/or kV according to patient size and/or use of iterative reconstruction technique. COMPARISON:  01/02/2023 CT head, 04/03/2022 CTA neck, no prior CT cervical spine available FINDINGS: CT HEAD FINDINGS Evaluation is somewhat limited by motion artifact. Brain: No evidence of acute infarct, hemorrhage, mass, mass effect, or midline shift. No hydrocephalus or extra-axial fluid collection. Periventricular white matter changes, likely the sequela of chronic small vessel ischemic disease. Vascular: No hyperdense vessel. Atherosclerotic calcifications in the intracranial carotid and vertebral arteries. Skull: Negative for fracture or focal lesion. Sinuses/Orbits: No acute finding. CT CERVICAL SPINE FINDINGS Alignment: No traumatic listhesis. Skull base and vertebrae: No acute fracture or suspicious osseous lesion. Soft tissues and spinal canal: No prevertebral fluid or swelling. No visible canal hematoma. Disc levels: Degenerative changes in the cervical spine. Moderate spinal canal stenosis at C5-C6 and mild spinal canal stenosis at C6-C7. Upper chest: Ground-glass opacities in the imaged right lung (series 5, image 76). No pleural effusion. Aortic atherosclerosis. IMPRESSION: 1. No acute intracranial process. 2. No acute fracture or traumatic listhesis in the cervical spine. 3. Ground-glass opacities in the imaged right lung, which could be infectious or inflammatory. 4. Aortic atherosclerosis. Aortic Atherosclerosis (ICD10-I70.0). Electronically Signed   By: Wiliam Ke M.D.   On: 04/15/2023 19:59   CT Cervical Spine Wo Contrast  Result Date: 04/15/2023 CLINICAL DATA:  Head and neck trauma EXAM: CT HEAD WITHOUT CONTRAST CT CERVICAL SPINE WITHOUT CONTRAST TECHNIQUE: Multidetector CT imaging of the head and cervical spine was performed following  the standard protocol without intravenous contrast. Multiplanar CT image reconstructions of the cervical spine were also generated. RADIATION DOSE REDUCTION: This exam was performed according to the departmental dose-optimization program which includes automated exposure control, adjustment of the mA and/or kV according to patient size and/or use of iterative reconstruction technique. COMPARISON:  01/02/2023 CT head, 04/03/2022 CTA neck, no prior CT cervical spine available FINDINGS: CT HEAD FINDINGS Evaluation is somewhat limited by motion artifact. Brain: No evidence of acute infarct, hemorrhage, mass, mass effect, or midline shift. No hydrocephalus or extra-axial fluid collection. Periventricular white matter changes, likely the sequela of chronic small vessel ischemic disease. Vascular: No hyperdense vessel. Atherosclerotic calcifications in the intracranial carotid and vertebral arteries. Skull: Negative for fracture or focal lesion. Sinuses/Orbits: No acute finding. CT CERVICAL SPINE FINDINGS Alignment: No traumatic listhesis. Skull base and vertebrae: No acute fracture or suspicious osseous lesion. Soft tissues and spinal canal: No prevertebral fluid or swelling. No visible canal hematoma. Disc levels: Degenerative changes in the cervical spine. Moderate spinal canal stenosis at C5-C6 and mild spinal canal stenosis at C6-C7. Upper chest: Ground-glass opacities in the imaged right lung (series 5, image 76). No pleural effusion. Aortic atherosclerosis. IMPRESSION: 1. No acute intracranial process. 2. No acute fracture or traumatic listhesis in the cervical  spine. 3. Ground-glass opacities in the imaged right lung, which could be infectious or inflammatory. 4. Aortic atherosclerosis. Aortic Atherosclerosis (ICD10-I70.0). Electronically Signed   By: Wiliam Ke M.D.   On: 04/15/2023 19:59   US Abdomen Limited RUQ (LIVER/GB)  Result Date: 04/15/2023 CLINICAL DATA:  Elevated liver enzyme EXAM: ULTRASOUND  ABDOMEN LIMITED RIGHT UPPER QUADRANT COMPARISON:  09/12/2016 right upper quadrant ultrasound FINDINGS: Gallbladder: No gallstones or wall thickening visualized. No sonographic Murphy sign noted by sonographer. Common bile duct: Diameter: 2 mm, within normal limits. No intrahepatic biliary ductal dilatation Liver: No focal lesion identified. Increased parenchymal echogenicity. Portal vein is patent on color Doppler imaging with normal direction of blood flow towards the liver. Other: No left lateral decubitus images could be obtained, as the patient asked to terminate the exam. IMPRESSION: Hepatic steatosis. Electronically Signed   By: Wiliam Ke M.D.   On: 04/15/2023 19:48   DG Hip Unilat W or Wo Pelvis 2-3 Views Right  Result Date: 04/15/2023 CLINICAL DATA:  Recent fall with right hip pain, initial EXAM: DG HIP (WITH OR WITHOUT PELVIS) 3V RIGHT COMPARISON:  None Available. FINDINGS: No acute fracture or dislocation is noted. Vascular calcifications are seen. Mild degenerative changes of the hip joints are noted. No soft tissue abnormality is seen. IMPRESSION: No acute abnormality noted. Electronically Signed   By: Alcide Clever M.D.   On: 04/15/2023 19:18   DG Chest Portable 1 View  Result Date: 04/15/2023 CLINICAL DATA:  Pain after fall EXAM: PORTABLE CHEST 1 VIEW COMPARISON:  Chest x-ray 01/02/2023 FINDINGS: No pneumothorax, effusion or edema. Normal cardiopericardial silhouette. Calcified aorta. Slight ill-defined hazy opacity at the right lung base. A subtle infiltrates possible. Recommend follow-up. Overlapping cardiac leads. IMPRESSION: Slight ill-defined hazy opacity in the right lung base, possible subtle infiltrate. Recommend follow-up Electronically Signed   By: Karen Kays M.D.   On: 04/15/2023 18:08     Today   Subjective    [name] today has no headache,no chest abdominal pain,no new weakness tingling or numbness, feels much better wants to go home today.    Objective   Blood  pressure (!) 166/66, pulse 62, temperature 97.8 F (36.6 C), temperature source Oral, resp. rate 15, height 5\' 3"  (1.6 m), weight 79.5 kg, SpO2 95 %.   Intake/Output Summary (Last 24 hours) at 04/20/2023 0752 Last data filed at 04/19/2023 1300 Gross per 24 hour  Intake --  Output 500 ml  Net -500 ml    Exam  Awake Alert, No new F.N deficits,    [place].AT,PERRAL Supple Neck,   Symmetrical Chest wall movement, Good air movement bilaterally, CTAB RRR,No Gallops,   +ve B.Sounds, Abd Soft, Non tender,  No Cyanosis, Clubbing or edema    Data Review   Recent Labs  Lab 04/15/23 1730 04/15/23 2159 04/16/23 0546 04/17/23 0155 04/18/23 0412 04/19/23 0259  WBC 19.5* 16.8* 15.2* 8.9 5.9 7.3  HGB 11.7* 10.1* 10.5* 8.6* 10.4* 9.6*  HCT 36.5 31.4* 31.3* 27.1* 32.2* 29.5*  PLT 205 169 150 166 168 190  MCV 93.1 93.5 93.2 92.5 94.7 91.6  MCH 29.8 30.1 31.3 29.4 30.6 29.8  MCHC 32.1 32.2 33.5 31.7 32.3 32.5  RDW 13.5 13.4 13.5 13.8 13.6 13.7  LYMPHSABS 0.4*  --   --  0.9 1.1 1.5  MONOABS 0.4  --   --  0.5 0.5 0.6  EOSABS 0.0  --   --  0.5 0.4 0.3  BASOSABS 0.1  --   --  0.0 0.0 0.0    Recent Labs  Lab 04/15/23 1730 04/15/23 1808 04/15/23 2000 04/15/23 2159 04/15/23 2328 04/16/23 0335 04/16/23 0546 04/17/23 0155 04/18/23 0412 04/19/23 0259  NA 135  --   --   --   --   --  134* 134* 135 135  K 3.8  --   --   --   --   --  3.7 3.8 3.6 3.9  CL 105  --   --   --   --   --  108 108 108 107  CO2 14*  --   --   --   --   --  15* 17* 17* 19*  ANIONGAP 16*  --   --   --   --   --  11 9 10 9   GLUCOSE 169*  --   --   --   --   --  108* 98 91 93  BUN 56*  --   --   --   --   --  51* 54* 44* 39*  CREATININE 4.00*  --   --  3.59*  --   --  3.24* 3.02* 2.47* 2.13*  AST 171*  --   --   --   --  111*  --  63* 53* 36  ALT 69*  --   --   --   --  54*  --  42 43 38  ALKPHOS 101  --   --   --   --  92  --  84 108 95  BILITOT 0.9  --   --   --   --  0.6  --  0.4 0.2* 0.5  ALBUMIN 3.0*  --   --    --   --  2.4*  --  2.1* 2.3* 2.3*  CRP  --   --   --   --   --   --  22.5* 20.9* 13.0* 8.0*  PROCALCITON  --   --   --   --   --   --  12.10 6.10 3.04 1.86  LATICACIDVEN  --  2.8* 2.7* 1.8 2.3*  --   --   --   --   --   INR  --   --   --   --   --  1.3*  --   --   --   --   TSH  --   --   --  1.013  --   --  0.803  --   --   --   AMMONIA  --   --   --   --   --   --  16  --   --   --   BNP  --   --   --   --   --   --  505.6* 267.3* 847.8* 638.2*  MG  --   --   --   --   --   --  1.1* 2.4 1.7 1.4*  CALCIUM 9.3  --   --   --   --   --  8.2* 8.3* 8.5* 8.6*    Total Time in preparing paper work, data evaluation and todays exam - 35 minutes  Signature  -    Susa Raring M.D on 04/20/2023 at 7:52 AM   -  To page go to [email or web address]

## 2023-04-20 NOTE — Plan of Care (Signed)
Problem: Education: Goal: Knowledge of disease or condition will improve Outcome: Adequate for Discharge Goal: Knowledge of secondary prevention will improve (MUST DOCUMENT ALL) Outcome: Adequate for Discharge Goal: Knowledge of patient specific risk factors will improve (Mark N/A or DELETE if not current risk factor) Outcome: Adequate for Discharge   Problem: Ischemic Stroke/TIA Tissue Perfusion: Goal: Complications of ischemic stroke/TIA will be minimized Outcome: Adequate for Discharge   Problem: Coping: Goal: Will verbalize positive feelings about self Outcome: Adequate for Discharge Goal: Will identify appropriate support needs Outcome: Adequate for Discharge   Problem: Health Behavior/Discharge Planning: Goal: Ability to manage health-related needs will improve Outcome: Adequate for Discharge Goal: Goals will be collaboratively established with patient/family Outcome: Adequate for Discharge   Problem: Self-Care: Goal: Ability to participate in self-care as condition permits will improve Outcome: Adequate for Discharge Goal: Verbalization of feelings and concerns over difficulty with self-care will improve Outcome: Adequate for Discharge Goal: Ability to communicate needs accurately will improve Outcome: Adequate for Discharge   Problem: Nutrition: Goal: Risk of aspiration will decrease Outcome: Adequate for Discharge Goal: Dietary intake will improve Outcome: Adequate for Discharge   Problem: Education: Goal: Ability to describe self-care measures that may prevent or decrease complications (Diabetes Survival Skills Education) will improve Outcome: Adequate for Discharge Goal: Individualized Educational Video(s) Outcome: Adequate for Discharge   Problem: Coping: Goal: Ability to adjust to condition or change in health will improve Outcome: Adequate for Discharge   Problem: Fluid Volume: Goal: Ability to maintain a balanced intake and output will  improve Outcome: Adequate for Discharge   Problem: Health Behavior/Discharge Planning: Goal: Ability to identify and utilize available resources and services will improve Outcome: Adequate for Discharge Goal: Ability to manage health-related needs will improve Outcome: Adequate for Discharge   Problem: Metabolic: Goal: Ability to maintain appropriate glucose levels will improve Outcome: Adequate for Discharge   Problem: Nutritional: Goal: Maintenance of adequate nutrition will improve Outcome: Adequate for Discharge Goal: Progress toward achieving an optimal weight will improve Outcome: Adequate for Discharge   Problem: Skin Integrity: Goal: Risk for impaired skin integrity will decrease Outcome: Adequate for Discharge   Problem: Tissue Perfusion: Goal: Adequacy of tissue perfusion will improve Outcome: Adequate for Discharge   Problem: Education: Goal: Knowledge of General Education information will improve Description: Including pain rating scale, medication(s)/side effects and non-pharmacologic comfort measures Outcome: Adequate for Discharge   Problem: Health Behavior/Discharge Planning: Goal: Ability to manage health-related needs will improve Outcome: Adequate for Discharge   Problem: Clinical Measurements: Goal: Ability to maintain clinical measurements within normal limits will improve Outcome: Adequate for Discharge Goal: Will remain free from infection Outcome: Adequate for Discharge Goal: Diagnostic test results will improve Outcome: Adequate for Discharge Goal: Respiratory complications will improve Outcome: Adequate for Discharge Goal: Cardiovascular complication will be avoided Outcome: Adequate for Discharge   Problem: Activity: Goal: Risk for activity intolerance will decrease Outcome: Adequate for Discharge   Problem: Nutrition: Goal: Adequate nutrition will be maintained Outcome: Adequate for Discharge   Problem: Coping: Goal: Level of  anxiety will decrease Outcome: Adequate for Discharge   Problem: Elimination: Goal: Will not experience complications related to bowel motility Outcome: Adequate for Discharge Goal: Will not experience complications related to urinary retention Outcome: Adequate for Discharge   Problem: Pain Managment: Goal: General experience of comfort will improve Outcome: Adequate for Discharge   Problem: Safety: Goal: Ability to remain free from injury will improve Outcome: Adequate for Discharge   Problem: Skin Integrity: Goal: Risk for   impaired skin integrity will decrease Outcome: Adequate for Discharge   

## 2023-04-20 NOTE — TOC Transition Note (Signed)
Transition of Care [facility]) - CM/SW Discharge Note   Patient Details  Name: [name] MRN: [id number] Date of Birth: Feb 21, 1946  Transition of Care [facility]) CM/SW Contact:  Lawerance Sabal, RN Phone Number: 04/20/2023, 8:10 AM   Clinical Narrative:     Spoke w patient over the phone to discuss DC needs.  She is familiar w [facility] from use last year and would like referral to them for [facility] services.  She discussed needing some extra help at home, [facility] PT RN HHA, SLP ordered, discussed that personal care services are set up by patient/ family as they are private pay. Discussed DME needs, she has RW and shower seat. She requested tub bench, this will be delivered to her room this morning  by [employer] for DC.   Final next level of care: Home w Home Health Services Barriers to Discharge: No Barriers Identified   Patient Goals and CMS Choice CMS Medicare.gov Compare Post Acute Care list provided to:: Patient Choice offered to / list presented to : Patient  Discharge Placement                         Discharge Plan and Services Additional resources added to the After Visit Summary for                  DME Arranged: Tub bench DME Agency: [employer] Date DME Agency Contacted: 04/20/23 Time DME Agency Contacted: [id number] Representative spoke with at DME Agency: Vaughan Basta HH Arranged: PT, RN, Nurse's Aide, Speech Therapy HH Agency: [facility] Date [facility] Agency Contacted: 04/20/23 Time HH Agency Contacted: [phone] Representative spoke with at [facility] Agency: Rivka Barbara  Social Determinants of Health (SDOH) Interventions SDOH Screenings   Food Insecurity: No Food Insecurity (02/12/2023)  Housing: Patient Unable To Answer (02/12/2023)  Transportation Needs: No Transportation Needs (02/12/2023)  Utilities: Not At Risk (02/12/2023)  Tobacco Use: Medium Risk (04/15/2023)     Readmission Risk Interventions     No data to display

## 2023-04-20 NOTE — Plan of Care (Signed)

## 2023-04-20 NOTE — Discharge Instructions (Signed)
Follow with Primary MD Garlan Fillers, MD in 7 days   Get CBC, CMP, Magnesium, 2 view Chest X ray -  checked next visit with your primary MD   Activity: As tolerated with Full fall precautions use walker/cane & assistance as needed  Disposition Home    Diet: Heart Healthy    Special Instructions: If you have smoked or chewed Tobacco  in the last 2 yrs please stop smoking, stop any regular Alcohol  and or any Recreational drug use.  On your next visit with your primary care physician please Get Medicines reviewed and adjusted.  Please request your Prim.MD to go over all Hospital Tests and Procedure/Radiological results at the follow up, please get all Hospital records sent to your Prim MD by signing hospital release before you go home.  If you experience worsening of your admission symptoms, develop shortness of breath, life threatening emergency, suicidal or homicidal thoughts you must seek medical attention immediately by calling 911 or calling your MD immediately  if symptoms less severe.  You Must read complete instructions/literature along with all the possible adverse reactions/side effects for all the Medicines you take and that have been prescribed to you. Take any new Medicines after you have completely understood and accpet all the possible adverse reactions/side effects.

## 2023-04-21 ENCOUNTER — Ambulatory Visit (HOSPITAL_COMMUNITY): Payer: Medicare HMO

## 2023-04-22 DIAGNOSIS — J189 Pneumonia, unspecified organism: Secondary | ICD-10-CM | POA: Diagnosis not present

## 2023-04-22 DIAGNOSIS — I509 Heart failure, unspecified: Secondary | ICD-10-CM | POA: Diagnosis not present

## 2023-04-22 DIAGNOSIS — E860 Dehydration: Secondary | ICD-10-CM | POA: Diagnosis not present

## 2023-04-22 DIAGNOSIS — B958 Unspecified staphylococcus as the cause of diseases classified elsewhere: Secondary | ICD-10-CM | POA: Diagnosis not present

## 2023-04-22 DIAGNOSIS — M6282 Rhabdomyolysis: Secondary | ICD-10-CM | POA: Diagnosis not present

## 2023-04-22 DIAGNOSIS — J440 Chronic obstructive pulmonary disease with acute lower respiratory infection: Secondary | ICD-10-CM | POA: Diagnosis not present

## 2023-04-22 DIAGNOSIS — N390 Urinary tract infection, site not specified: Secondary | ICD-10-CM | POA: Diagnosis not present

## 2023-04-22 DIAGNOSIS — I130 Hypertensive heart and chronic kidney disease with heart failure and stage 1 through stage 4 chronic kidney disease, or unspecified chronic kidney disease: Secondary | ICD-10-CM | POA: Diagnosis not present

## 2023-04-22 DIAGNOSIS — G9341 Metabolic encephalopathy: Secondary | ICD-10-CM | POA: Diagnosis not present

## 2023-04-23 ENCOUNTER — Ambulatory Visit (HOSPITAL_COMMUNITY): Payer: Medicare HMO

## 2023-04-23 ENCOUNTER — Telehealth (HOSPITAL_COMMUNITY): Payer: Self-pay

## 2023-04-23 NOTE — Telephone Encounter (Signed)
No response from pt in regards to cardiac rehab. Left another VM.   Closed referral

## 2023-04-24 DIAGNOSIS — M5416 Radiculopathy, lumbar region: Secondary | ICD-10-CM | POA: Diagnosis not present

## 2023-04-24 DIAGNOSIS — M545 Low back pain, unspecified: Secondary | ICD-10-CM | POA: Diagnosis not present

## 2023-04-24 DIAGNOSIS — M47896 Other spondylosis, lumbar region: Secondary | ICD-10-CM | POA: Diagnosis not present

## 2023-04-24 DIAGNOSIS — M1712 Unilateral primary osteoarthritis, left knee: Secondary | ICD-10-CM | POA: Diagnosis not present

## 2023-04-24 DIAGNOSIS — Z5181 Encounter for therapeutic drug level monitoring: Secondary | ICD-10-CM | POA: Diagnosis not present

## 2023-04-24 DIAGNOSIS — M5136 Other intervertebral disc degeneration, lumbar region: Secondary | ICD-10-CM | POA: Diagnosis not present

## 2023-04-24 DIAGNOSIS — Z79899 Other long term (current) drug therapy: Secondary | ICD-10-CM | POA: Diagnosis not present

## 2023-04-25 ENCOUNTER — Ambulatory Visit (HOSPITAL_COMMUNITY): Payer: Medicare HMO

## 2023-04-27 ENCOUNTER — Other Ambulatory Visit: Payer: Self-pay | Admitting: Cardiology

## 2023-04-28 ENCOUNTER — Ambulatory Visit (HOSPITAL_COMMUNITY): Payer: Medicare HMO

## 2023-04-28 DIAGNOSIS — J189 Pneumonia, unspecified organism: Secondary | ICD-10-CM | POA: Diagnosis not present

## 2023-04-28 DIAGNOSIS — I130 Hypertensive heart and chronic kidney disease with heart failure and stage 1 through stage 4 chronic kidney disease, or unspecified chronic kidney disease: Secondary | ICD-10-CM | POA: Diagnosis not present

## 2023-04-28 DIAGNOSIS — N390 Urinary tract infection, site not specified: Secondary | ICD-10-CM | POA: Diagnosis not present

## 2023-04-28 DIAGNOSIS — G9341 Metabolic encephalopathy: Secondary | ICD-10-CM | POA: Diagnosis not present

## 2023-04-28 DIAGNOSIS — J440 Chronic obstructive pulmonary disease with acute lower respiratory infection: Secondary | ICD-10-CM | POA: Diagnosis not present

## 2023-04-28 DIAGNOSIS — M6282 Rhabdomyolysis: Secondary | ICD-10-CM | POA: Diagnosis not present

## 2023-04-28 DIAGNOSIS — E860 Dehydration: Secondary | ICD-10-CM | POA: Diagnosis not present

## 2023-04-28 DIAGNOSIS — I509 Heart failure, unspecified: Secondary | ICD-10-CM | POA: Diagnosis not present

## 2023-04-28 DIAGNOSIS — B958 Unspecified staphylococcus as the cause of diseases classified elsewhere: Secondary | ICD-10-CM | POA: Diagnosis not present

## 2023-04-29 DIAGNOSIS — I129 Hypertensive chronic kidney disease with stage 1 through stage 4 chronic kidney disease, or unspecified chronic kidney disease: Secondary | ICD-10-CM | POA: Diagnosis not present

## 2023-04-29 DIAGNOSIS — N179 Acute kidney failure, unspecified: Secondary | ICD-10-CM | POA: Diagnosis not present

## 2023-04-29 DIAGNOSIS — T796XXA Traumatic ischemia of muscle, initial encounter: Secondary | ICD-10-CM | POA: Diagnosis not present

## 2023-04-29 DIAGNOSIS — R7401 Elevation of levels of liver transaminase levels: Secondary | ICD-10-CM | POA: Diagnosis not present

## 2023-04-29 DIAGNOSIS — Z0000 Encounter for general adult medical examination without abnormal findings: Secondary | ICD-10-CM | POA: Diagnosis not present

## 2023-04-29 DIAGNOSIS — J690 Pneumonitis due to inhalation of food and vomit: Secondary | ICD-10-CM | POA: Diagnosis not present

## 2023-04-29 DIAGNOSIS — G9341 Metabolic encephalopathy: Secondary | ICD-10-CM | POA: Diagnosis not present

## 2023-04-29 DIAGNOSIS — K861 Other chronic pancreatitis: Secondary | ICD-10-CM | POA: Diagnosis not present

## 2023-04-30 ENCOUNTER — Ambulatory Visit (HOSPITAL_COMMUNITY): Payer: Medicare HMO

## 2023-04-30 DIAGNOSIS — I130 Hypertensive heart and chronic kidney disease with heart failure and stage 1 through stage 4 chronic kidney disease, or unspecified chronic kidney disease: Secondary | ICD-10-CM | POA: Diagnosis not present

## 2023-04-30 DIAGNOSIS — E860 Dehydration: Secondary | ICD-10-CM | POA: Diagnosis not present

## 2023-04-30 DIAGNOSIS — J189 Pneumonia, unspecified organism: Secondary | ICD-10-CM | POA: Diagnosis not present

## 2023-04-30 DIAGNOSIS — B958 Unspecified staphylococcus as the cause of diseases classified elsewhere: Secondary | ICD-10-CM | POA: Diagnosis not present

## 2023-04-30 DIAGNOSIS — I509 Heart failure, unspecified: Secondary | ICD-10-CM | POA: Diagnosis not present

## 2023-04-30 DIAGNOSIS — N390 Urinary tract infection, site not specified: Secondary | ICD-10-CM | POA: Diagnosis not present

## 2023-04-30 DIAGNOSIS — G9341 Metabolic encephalopathy: Secondary | ICD-10-CM | POA: Diagnosis not present

## 2023-04-30 DIAGNOSIS — M6282 Rhabdomyolysis: Secondary | ICD-10-CM | POA: Diagnosis not present

## 2023-04-30 DIAGNOSIS — J440 Chronic obstructive pulmonary disease with acute lower respiratory infection: Secondary | ICD-10-CM | POA: Diagnosis not present

## 2023-05-02 ENCOUNTER — Ambulatory Visit (HOSPITAL_COMMUNITY): Payer: Medicare HMO

## 2023-05-02 DIAGNOSIS — E860 Dehydration: Secondary | ICD-10-CM | POA: Diagnosis not present

## 2023-05-02 DIAGNOSIS — M6282 Rhabdomyolysis: Secondary | ICD-10-CM | POA: Diagnosis not present

## 2023-05-02 DIAGNOSIS — J189 Pneumonia, unspecified organism: Secondary | ICD-10-CM | POA: Diagnosis not present

## 2023-05-02 DIAGNOSIS — N390 Urinary tract infection, site not specified: Secondary | ICD-10-CM | POA: Diagnosis not present

## 2023-05-02 DIAGNOSIS — J440 Chronic obstructive pulmonary disease with acute lower respiratory infection: Secondary | ICD-10-CM | POA: Diagnosis not present

## 2023-05-02 DIAGNOSIS — G9341 Metabolic encephalopathy: Secondary | ICD-10-CM | POA: Diagnosis not present

## 2023-05-02 DIAGNOSIS — I509 Heart failure, unspecified: Secondary | ICD-10-CM | POA: Diagnosis not present

## 2023-05-02 DIAGNOSIS — B958 Unspecified staphylococcus as the cause of diseases classified elsewhere: Secondary | ICD-10-CM | POA: Diagnosis not present

## 2023-05-02 DIAGNOSIS — I130 Hypertensive heart and chronic kidney disease with heart failure and stage 1 through stage 4 chronic kidney disease, or unspecified chronic kidney disease: Secondary | ICD-10-CM | POA: Diagnosis not present

## 2023-05-05 ENCOUNTER — Ambulatory Visit (HOSPITAL_COMMUNITY): Payer: Medicare HMO

## 2023-05-06 DIAGNOSIS — J440 Chronic obstructive pulmonary disease with acute lower respiratory infection: Secondary | ICD-10-CM | POA: Diagnosis not present

## 2023-05-06 DIAGNOSIS — B958 Unspecified staphylococcus as the cause of diseases classified elsewhere: Secondary | ICD-10-CM | POA: Diagnosis not present

## 2023-05-06 DIAGNOSIS — E860 Dehydration: Secondary | ICD-10-CM | POA: Diagnosis not present

## 2023-05-06 DIAGNOSIS — I130 Hypertensive heart and chronic kidney disease with heart failure and stage 1 through stage 4 chronic kidney disease, or unspecified chronic kidney disease: Secondary | ICD-10-CM | POA: Diagnosis not present

## 2023-05-06 DIAGNOSIS — I509 Heart failure, unspecified: Secondary | ICD-10-CM | POA: Diagnosis not present

## 2023-05-06 DIAGNOSIS — G9341 Metabolic encephalopathy: Secondary | ICD-10-CM | POA: Diagnosis not present

## 2023-05-06 DIAGNOSIS — N390 Urinary tract infection, site not specified: Secondary | ICD-10-CM | POA: Diagnosis not present

## 2023-05-06 DIAGNOSIS — J189 Pneumonia, unspecified organism: Secondary | ICD-10-CM | POA: Diagnosis not present

## 2023-05-06 DIAGNOSIS — M6282 Rhabdomyolysis: Secondary | ICD-10-CM | POA: Diagnosis not present

## 2023-05-07 ENCOUNTER — Ambulatory Visit (HOSPITAL_COMMUNITY): Payer: Medicare HMO

## 2023-05-08 DIAGNOSIS — E860 Dehydration: Secondary | ICD-10-CM | POA: Diagnosis not present

## 2023-05-08 DIAGNOSIS — G9341 Metabolic encephalopathy: Secondary | ICD-10-CM | POA: Diagnosis not present

## 2023-05-08 DIAGNOSIS — I509 Heart failure, unspecified: Secondary | ICD-10-CM | POA: Diagnosis not present

## 2023-05-08 DIAGNOSIS — I130 Hypertensive heart and chronic kidney disease with heart failure and stage 1 through stage 4 chronic kidney disease, or unspecified chronic kidney disease: Secondary | ICD-10-CM | POA: Diagnosis not present

## 2023-05-08 DIAGNOSIS — J440 Chronic obstructive pulmonary disease with acute lower respiratory infection: Secondary | ICD-10-CM | POA: Diagnosis not present

## 2023-05-08 DIAGNOSIS — M6282 Rhabdomyolysis: Secondary | ICD-10-CM | POA: Diagnosis not present

## 2023-05-08 DIAGNOSIS — J189 Pneumonia, unspecified organism: Secondary | ICD-10-CM | POA: Diagnosis not present

## 2023-05-08 DIAGNOSIS — N390 Urinary tract infection, site not specified: Secondary | ICD-10-CM | POA: Diagnosis not present

## 2023-05-08 DIAGNOSIS — B958 Unspecified staphylococcus as the cause of diseases classified elsewhere: Secondary | ICD-10-CM | POA: Diagnosis not present

## 2023-05-09 ENCOUNTER — Ambulatory Visit (HOSPITAL_COMMUNITY): Payer: Medicare HMO

## 2023-05-09 ENCOUNTER — Encounter: Payer: Self-pay | Admitting: Emergency Medicine

## 2023-05-10 NOTE — Progress Notes (Signed)
Cardiology Office Note:    Date:  05/21/2023   ID:  NYOMI LAST, DOB [birth year], MRN [id number]  PCP:  Garlan Fillers, MD   [facility] Providers Cardiologist:  Peter Swaziland, MD     Referring MD: Garlan Fillers, MD   Chief Complaint  Patient presents with   Hospitalization Follow-up    Syncope, HTN    History of Present Illness:    [name] is a 77 y.o. female with a hx of carotid artery disease with known occlusion of the left ICA, CAD, DM2, diabetes insipidus, dizziness, hypertension, GERD, chronic pancreatitis on enzymes, CVA, PAD with stent to the left SFA, and OSA.  She has a known history of CAD with 60 to 70% stenosis in the mid LAD.  She has had several heart catheterizations in 1999, 2002, 2006, 2012 with stable anatomy.  Nuclear stress test 2020 with normal perfusion.  LVEF is known to be 45-50% by Myoview and echocardiogram.  She is status post ex lap, lysis of adhesions, end ileostomy takedown, small bowel resection and ileocolonic anastomosis on 03/23/2021 in [place].  She was admitted 04/15/2022 after presenting with strokelike symptoms at her PCPs office with right arm numbness, slurred speech.  Given her history and risk factors she was sent to the ER.  Imaging was initially unremarkable however CTA and transcranial Doppler showed a string sign concerning for eminent vascular compromise prompting vascular consultation and subsequent right TCAR 04/18/2022.  She was discharged on aspirin and Plavix.  She was admitted 01/2023 with chest pain and underwent heart catheterization that showed critical ostial RCA stenosis with heavy calcification.  Due to her CKD she returned for staged PCI and atherectomy on 02/12/2023 in which a DES 3.5 x 16 mm was placed in the ostial RCA using IVUS guidance and orbital atherectomy. At last office visit with Dr. Swaziland on 02/18/2023 he added clonidine 0.1 mg at bedtime for uncontrolled hypertension.  She was hospitalized 7/2 -  04/20/2023 with altered mental status after being found down in her home covered in urine and feces.  She was admitted and treated for rhabdomyolysis, AKI, UTI, and pneumonia.  She was severely dehydrated which was treated and she received antibiotics. CK was nearly 3000.  Aspiration pneumonia and UTI treated with p.o. antibiotics after discharge. sCr at discharge was 2.13.  Losartan and SGLT2 inhibitor were held.   She presents today for follow-up.  It is unclear if she syncopized. She has had diarrhea since before the procedure. She questions if she was dehydrated due to diarrhea causing her fall/LOC. She states she had chest pain last night treated with nitroglycerin SL. She has taken NTG twice since her heart cath. Suspect she likely missed DAPT when found down, but this is now over 1 month ago. She recently went to [facility] for flowers and was able to walk without chest pain but did have fatigue.   BP is well controlled today, but son reports BP is not controlled at home. On my recheck, 108/76 - he states this is wrong. They will keep a BP log. Son also asks about his own BP medications.   Past Medical History:  Diagnosis Date   Anxiety    Arthritis    back    Blind loop syndrome    Clotting disorder (HCC)    Hx Clot in leg per pt    Colostomy present [facility])    Coronary artery disease    a. known 60-70% mid-LAD stenosis  with caths in 1999, 2002, 2006, and 2012 showing stable anatomy b. 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HCC)    Diabetes mellitus    Diverticulosis    Dizziness    chronic   Dyslipidemia    Fatty liver    GERD (gastroesophageal reflux disease)    hiatial hernia   Heart murmur    Hiatal hernia    Hypertension    Irritable bowel syndrome    Pancreatitis, chronic (HCC)    Peripheral vascular disease (HCC) 02/20/2010   s/p stent of left SFA; carotid stenosis   Seizure (HCC) 11/04/2018   last 1 yr 2020 per pt unsure month    Sleep apnea    no cpap     Stroke (HCC)    Thyroid nodule    Vitamin B12 deficiency     Past Surgical History:  Procedure Laterality Date   ABDOMINAL HYSTERECTOMY     partial   AUGMENTATION MAMMAPLASTY     bilateral 1976 retro    BREAST ENHANCEMENT SURGERY     CARDIAC CATHETERIZATION  07/07/98;08/31/01;03/04/05   60 -70% LAD   CATARACT EXTRACTION Bilateral    COLON RESECTION N/A 10/14/2017   Procedure: EXPLORATORY LAPAROTOMY, EXTENDED RIGHT COLECTOMY; APPLICATION OF ABDOMINAL VACUUM DRESSING;  Surgeon: Griselda Miner, MD;  Location: [facility] ORS;  Service: General;  Laterality: N/A;   COLONOSCOPY     CORONARY ATHERECTOMY N/A 02/12/2023   Procedure: CORONARY ATHERECTOMY;  Surgeon: Swaziland, Peter M, MD;  Location: [facility] INVASIVE CV LAB;  Service: Cardiovascular;  Laterality: N/A;   CORONARY STENT INTERVENTION N/A 02/12/2023   Procedure: CORONARY STENT INTERVENTION;  Surgeon: Swaziland, Peter M, MD;  Location: [facility]VASIVE CV LAB;  Service: Cardiovascular;  Laterality: N/A;   CORONARY ULTRASOUND/IVUS N/A 02/12/2023   Procedure: Coronary Ultrasound/IVUS;  Surgeon: Swaziland, Peter M, MD;  Location: [facility] INVASIVE CV LAB;  Service: Cardiovascular;  Laterality: N/A;   ENDOBRONCHIAL ULTRASOUND Bilateral 02/24/2017   Procedure: ENDOBRONCHIAL ULTRASOUND;  Surgeon: Leslye Peer, MD;  Location: [facility] ENDOSCOPY;  Service: Cardiopulmonary;  Laterality: Bilateral;   FEMORAL ARTERY STENT     Left leg   LAPAROTOMY N/A 10/16/2017   Procedure: EXPLORATORY LAPAROTOMY, PARTIAL OMENTECTOMY, RESECTION ISCHEMIC ILEUM 130CM, APPLICATION OF VAC ABDOMINAL DRESSING;  Surgeon: Darnell Level, MD;  Location: [facility] ORS;  Service: General;  Laterality: N/A;   LAPAROTOMY N/A 10/18/2017   Procedure: RE-EXPLORATION OF ABDOMEN, ILEOSTOMY CREATION;  Surgeon: Almond Lint, MD;  Location: [facility] ORS;  Service: General;  Laterality: N/A;   LEFT HEART CATH AND CORONARY ANGIOGRAPHY N/A 01/29/2023   Procedure: LEFT HEART CATH AND CORONARY ANGIOGRAPHY;  Surgeon: Swaziland, Peter M, MD;   Location: [facility] INVASIVE CV LAB;  Service: Cardiovascular;  Laterality: N/A;   LUNG SURGERY  03/2017   Benign polyps removed   PATELLA REALIGNMENT Left    POLYPECTOMY     SIGMOIDOSCOPY     TONSILLECTOMY     TRANSCAROTID ARTERY REVASCULARIZATION  Right 04/18/2022   Procedure: Transcarotid Artery Revascularization;  Surgeon: Victorino Sparrow, MD;  Location: [facility] OR;  Service: Vascular;  Laterality: Right;    Current Medications: Current Meds  Medication Sig   acetaminophen (TYLENOL) 325 MG tablet Take 650 mg by mouth every 6 (six) hours as needed for mild pain.   albuterol (VENTOLIN HFA) 108 (90 Base) MCG/ACT inhaler Inhale 1-2 puffs into the lungs every 6 (six) hours as needed for wheezing or shortness of breath.   amLODipine (NORVASC) 10 MG tablet Take  1 tablet (10 mg total) by mouth daily.   aspirin 81 MG chewable tablet Chew 1 tablet (81 mg total) by mouth daily.   atorvastatin (LIPITOR) 40 MG tablet Take 1 tablet (40 mg total) by mouth daily.   cloNIDine (CATAPRES) 0.1 MG tablet Take 1 tablet (0.1 mg total) by mouth 2 (two) times daily.   clopidogrel (PLAVIX) 75 MG tablet Take 1 tablet (75 mg total) by mouth daily. (Patient taking differently: Take 75 mg by mouth every evening.)   [employer] DIGESTIVE PROBIOTIC 250 MG capsule Take 500 mg by mouth daily.   esomeprazole (NEXIUM) 40 MG capsule Take 40 mg by mouth 2 (two) times daily before a meal.   fluticasone (FLONASE) 50 MCG/ACT nasal spray Place 2 sprays into both nostrils daily as needed for allergies.   furosemide (LASIX) 20 MG tablet Take 1 tablet (20 mg total) by mouth daily as needed. (Patient taking differently: Take 20 mg by mouth every other day.)   isosorbide mononitrate (IMDUR) 60 MG 24 hr tablet TAKE 1 TABLET BY MOUTH EVERY DAY   levETIRAcetam (KEPPRA) 500 MG tablet TAKE 1 TABLET BY MOUTH TWICE A DAY (Patient taking differently: Take 500 mg by mouth 2 (two) times daily.)   meclizine (ANTIVERT) 25 MG tablet Take 0.5 tablets (12.5 mg  total) by mouth 3 (three) times daily as needed for dizziness.   metoprolol succinate (TOPROL-XL) 100 MG 24 hr tablet Take 100 mg by mouth daily.   mirtazapine (REMERON) 15 MG tablet Take 15 mg by mouth at bedtime.   montelukast (SINGULAIR) 10 MG tablet Take 10 mg by mouth daily.   nitroGLYCERIN (NITROSTAT) 0.4 MG SL tablet Place 1 tablet (0.4 mg total) under the tongue every 5 (five) minutes as needed for chest pain.   ondansetron (ZOFRAN) 4 MG tablet Take 1 tablet (4 mg total) by mouth every 6 (six) hours as needed for nausea or vomiting.   Pancrelipase, Lip-Prot-Amyl, (ZENPEP) 40000-126000 units CPEP Take 2 capsules with meals and one capsule with snacks. (Patient taking differently: Take 2 capsules by mouth 2 (two) times daily.)   pantoprazole (PROTONIX) 40 MG tablet Take 1 tablet (40 mg total) by mouth 2 (two) times daily.   Polysaccharide-Iron Complex 150 MG CAPS Take 1 tablet by mouth daily.   venlafaxine XR (EFFEXOR-XR) 75 MG 24 hr capsule Take 1 capsule (75 mg total) by mouth daily.   vitamin B-12 (CYANOCOBALAMIN) 500 MCG tablet Take 500 mcg by mouth daily.   Vitamin D, Ergocalciferol, (DRISDOL) 1.25 MG (50000 UT) CAPS capsule Take 50,000 Units by mouth every Wednesday.      Allergies:   Adhesive [tape], Other, Ace inhibitors, Sulfa antibiotics, Topiramate, Codeine, Iodinated contrast media, and Latex   Social History   Socioeconomic History   Marital status: Divorced    Spouse name: Not on file   Number of children: 2   Years of education: Not on file   Highest education level: Not on file  Occupational History   Occupation: retired    Associate Professor: DISABLED  Tobacco Use   Smoking status: Former    Current packs/day: 0.00    Average packs/day: 1 pack/day for 50.0 years (50.0 ttl pk-yrs)    Types: Cigarettes    Start date: 10/14/1960    Quit date: 10/14/2010    Years since quitting: 12.6   Smokeless tobacco: Never  Vaping Use   Vaping status: Never Used  Substance and Sexual  Activity   Alcohol use: No   Drug use: No  Sexual activity: Not Currently    Comment: Hysterectomy  Other Topics Concern   Not on file  Social History Narrative   Not on file   Social Determinants of Health   Financial Resource Strain: Not on file  Food Insecurity: No Food Insecurity (02/12/2023)   Hunger Vital Sign    Worried About Running Out of Food in the Last Year: Never true    Ran Out of Food in the Last Year: Never true  Transportation Needs: No Transportation Needs (02/12/2023)   PRAPARE - Administrator, Civil Service (Medical): No    Lack of Transportation (Non-Medical): No  Physical Activity: Not on file  Stress: Not on file  Social Connections: Not on file     Family History: The patient's family history includes Colon cancer in her paternal grandfather; Hypertension in her father and mother; Osteoarthritis in her mother; Pulmonary embolism in her mother; Stroke in her father and another family member. There is no history of Breast cancer, Colon polyps, Esophageal cancer, Rectal cancer, Stomach cancer, Pancreatic cancer, or Liver disease.  ROS:   Please see the history of present illness.     All other systems reviewed and are negative.  EKGs/Labs/Other Studies Reviewed:    The following studies were reviewed today: Cardiac Studies & Procedures   CARDIAC CATHETERIZATION  CARDIAC CATHETERIZATION 02/12/2023  Narrative   Ost RCA lesion is 95% stenosed.   Prox RCA lesion is 30% stenosed.   A drug-eluting stent was successfully placed using a SYNERGY XD 3.50X16.   Post intervention, there is a 0% residual stenosis.  Successful PCI of the ostial RCA with DES x 1 with IVUS guidance and orbital atherectomy.  Plan: will observe overnight. DAPT continue at least 12 months. Check renal function in am.  Findings Coronary Findings Diagnostic  Dominance: Right  Left Main Vessel is normal in caliber. The vessel exhibits minimal luminal  irregularities.  Left Anterior Descending Mid LAD lesion is 45% stenosed.  Ramus Intermedius Vessel is small. Vessel is angiographically normal.  Left Circumflex Vessel is normal in caliber. There is mild diffuse disease throughout the vessel.  Right Coronary Artery Ost RCA lesion is 95% stenosed. The lesion is severely calcified. Prox RCA lesion is 30% stenosed.  Intervention  Ost RCA lesion Stent CATH LAUNCHER 6FR JR4 guide catheter was inserted. Lesion crossed with guidewire using a WIRE ASAHI PROWATER 180CM. Pre-stent angioplasty was performed using a BALLN SCOREFLEX 3.50X10. A drug-eluting stent was successfully placed using a SYNERGY XD 3.50X16. Stent strut is well apposed. Post-stent angioplasty was performed using a BALL SAPPHIRE NC24 4.0X8. Maximum pressure:  20 atm. Atherectomy Orbital atherectomy was performed using a CROWN DIAMONDBACK CLASSIC 1.25. 7 passes taken. Post-Intervention Lesion Assessment The intervention was successful. Pre-interventional TIMI flow is 2. Post-intervention TIMI flow is 3. No complications occurred at this lesion. Ultrasound (IVUS) was performed on the lesion post PCI using a CATH OPTICROSS HD. Stent well apposed. There is a 0% residual stenosis post intervention.   CARDIAC CATHETERIZATION  CARDIAC CATHETERIZATION 01/29/2023  Narrative   Mid LAD lesion is 45% stenosed.   Ost RCA lesion is 95% stenosed.   Prox RCA lesion is 30% stenosed.   LV end diastolic pressure is normal.  Critical ostial RCA stenosis. Otherwise nonobstructive CAD Normal LVEDP  Plan: recommend staged PCI of the ostial RCA. This is a complex lesion due to ostial location and calcification. Will require plaque modification with atherectomy. Plan to return in 2 weeks provided renal  function is stable. Will add Imdur 60 mg daily.  Findings Coronary Findings Diagnostic  Dominance: Right  Left Main Vessel was injected. Vessel is normal in caliber. The vessel exhibits  minimal luminal irregularities.  Left Anterior Descending Mid LAD lesion is 45% stenosed.  Ramus Intermedius Vessel is small. Vessel is angiographically normal.  Left Circumflex Vessel was injected. Vessel is normal in caliber. There is mild diffuse disease throughout the vessel.  Right Coronary Artery Ost RCA lesion is 95% stenosed. The lesion is severely calcified. Prox RCA lesion is 30% stenosed.  Intervention  No interventions have been documented.   STRESS TESTS  MYOCARDIAL PERFUSION IMAGING 12/18/2018  Narrative  The left ventricular ejection fraction is mildly decreased (45-54%).  Nuclear stress EF: 48%.  No T wave inversion was noted during stress.  There was no ST segment deviation noted during stress.  This is an intermediate risk study.  No reversible ischemia. LVEF 48% with basal septal dyskinesis. This is an intermediate risk study (LVEF<49%) - recommend correlating findings with echo.   ECHOCARDIOGRAM  ECHOCARDIOGRAM COMPLETE 12/26/2021  Narrative ECHOCARDIOGRAM REPORT    Patient Name:   [name] Date of Exam: 12/26/2021 Medical Rec #:  [id number]      Height:       63.0 in Accession #:    [id number]     Weight:       164.7 lb Date of Birth:  1946/03/16      BSA:          1.780 m Patient Age:    75 years       BP:           157/57 mmHg Patient Gender: F              HR:           96 bpm. Exam Location:  Inpatient  Procedure: 2D Echo, Cardiac Doppler, Color Doppler and Intracardiac Opacification Agent  Indications:    I63.9 STROKE  History:        Patient has prior history of Echocardiogram examinations. CAD, Stroke, Signs/Symptoms:Murmur and Dyspnea; Risk Factors:Diabetes, Hypertension and Dyslipidemia.  Sonographer:    Festus Barren Referring Phys: [id number] CHING T TU   Sonographer Comments: Suboptimal parasternal window, suboptimal apical window and suboptimal subcostal window. Image acquisition challenging due to breast  implants. IMPRESSIONS   1. Left ventricular ejection fraction, by estimation, is 45 to 50%. The left ventricle has mildly decreased function. The left ventricle demonstrates regional wall motion abnormalities. Apical hypokinesis. There is mild left ventricular hypertrophy. Left ventricular diastolic parameters are indeterminate. 2. Right ventricular systolic function is normal. The right ventricular size is normal. Tricuspid regurgitation signal is inadequate for assessing PA pressure. 3. The mitral valve is normal in structure. No evidence of mitral valve regurgitation. 4. The aortic valve was not well visualized. Aortic valve regurgitation is not visualized. No aortic stenosis is present. 5. The inferior vena cava is normal in size with greater than 50% respiratory variability, suggesting right atrial pressure of 3 mmHg.  FINDINGS Left Ventricle: Left ventricular ejection fraction, by estimation, is 45 to 50%. The left ventricle has mildly decreased function. The left ventricle demonstrates regional wall motion abnormalities. The left ventricular internal cavity size was normal in size. There is mild left ventricular hypertrophy. Left ventricular diastolic parameters are indeterminate.  Right Ventricle: The right ventricular size is normal. No increase in right ventricular wall thickness. Right ventricular systolic function is normal. Tricuspid regurgitation signal is inadequate  for assessing PA pressure.  Left Atrium: Left atrial size was normal in size.  Right Atrium: Right atrial size was normal in size.  Pericardium: There is no evidence of pericardial effusion. Presence of epicardial fat layer.  Mitral Valve: The mitral valve is normal in structure. No evidence of mitral valve regurgitation.  Tricuspid Valve: The tricuspid valve is normal in structure. Tricuspid valve regurgitation is trivial.  Aortic Valve: The aortic valve was not well visualized. Aortic valve regurgitation is not  visualized. No aortic stenosis is present. Aortic valve mean gradient measures 5.0 mmHg. Aortic valve peak gradient measures 8.2 mmHg. Aortic valve area, by VTI measures 2.12 cm.  Pulmonic Valve: The pulmonic valve was not well visualized. Pulmonic valve regurgitation is not visualized.  Aorta: The aortic root and ascending aorta are structurally normal, with no evidence of dilitation.  Venous: The inferior vena cava is normal in size with greater than 50% respiratory variability, suggesting right atrial pressure of 3 mmHg.  IAS/Shunts: The interatrial septum was not well visualized.   LEFT VENTRICLE PLAX 2D LVIDd:         3.80 cm     Diastology LVIDs:         2.20 cm     LV e' medial:  4.68 cm/s LV PW:         1.10 cm     LV e' lateral: 10.10 cm/s LV IVS:        1.10 cm LVOT diam:     1.80 cm LV SV:         56 LV SV Index:   31 LVOT Area:     2.54 cm  LV Volumes (MOD) LV vol d, MOD A2C: 83.0 ml LV vol d, MOD A4C: 85.5 ml LV vol s, MOD A2C: 54.3 ml LV vol s, MOD A4C: 37.9 ml LV SV MOD A2C:     28.7 ml LV SV MOD A4C:     85.5 ml LV SV MOD BP:      36.7 ml  RIGHT VENTRICLE             IVC RV S prime:     15.70 cm/s  IVC diam: 1.30 cm TAPSE (M-mode): 3.0 cm  LEFT ATRIUM             Index        RIGHT ATRIUM           Index LA diam:        3.90 cm 2.19 cm/m   RA Area:     10.00 cm LA Vol (A2C):   52.8 ml 29.66 ml/m  RA Volume:   20.70 ml  11.63 ml/m LA Vol (A4C):   42.4 ml 23.82 ml/m LA Biplane Vol: 47.4 ml 26.62 ml/m AORTIC VALVE                     PULMONIC VALVE AV Area (Vmax):    1.45 cm      PV Vmax:       0.80 m/s AV Area (Vmean):   1.37 cm      PV Vmean:      51.500 cm/s AV Area (VTI):     2.12 cm      PV VTI:        0.173 m AV Vmax:           143.00 cm/s   PV Peak grad:  2.6 mmHg AV Vmean:  102.000 cm/s  PV Mean grad:  1.0 mmHg AV VTI:            0.264 m AV Peak Grad:      8.2 mmHg AV Mean Grad:      5.0 mmHg LVOT Vmax:         81.50 cm/s LVOT  Vmean:        55.100 cm/s LVOT VTI:          0.220 m LVOT/AV VTI ratio: 0.83  AORTA Ao Root diam: 2.50 cm Ao Asc diam:  2.80 cm   SHUNTS Systemic VTI:  0.22 m Systemic Diam: 1.80 cm  Epifanio Lesches MD Electronically signed by Epifanio Lesches MD Signature Date/Time: 12/26/2021/1:38:40 PM    Final                  Recent Labs: 04/16/2023: TSH 0.803 04/19/2023: ALT 38; B Natriuretic Peptide 638.2; BUN 39; Creatinine, Ser 2.13; Hemoglobin 9.6; Magnesium 1.4; Platelets 190; Potassium 3.9; Sodium 135  Recent Lipid Panel    Component Value Date/Time   CHOL 104 04/16/2023 0335   TRIG 138 04/16/2023 0335   HDL 30 (L) 04/16/2023 0335   CHOLHDL 3.5 04/16/2023 0335   VLDL 28 04/16/2023 0335   LDLCALC 46 04/16/2023 0335     Risk Assessment/Calculations:                Physical Exam:    VS:  BP 118/74   Pulse (!) 55   Ht 5\' 3"  (1.6 m)   Wt 166 lb 3.2 oz (75.4 kg)   SpO2 98%   BMI 29.44 kg/m     Wt Readings from Last 3 Encounters:  05/21/23 166 lb 3.2 oz (75.4 kg)  04/19/23 175 lb 4.3 oz (79.5 kg)  03/31/23 167 lb (75.8 kg)     GEN:  Well nourished, well developed in no acute distress HEENT: Normal NECK: No JVD; No carotid bruits LYMPHATICS: No lymphadenopathy CARDIAC: RRR, no murmurs, rubs, gallops RESPIRATORY:  Clear to auscultation without rales, wheezing or rhonchi  ABDOMEN: Soft, non-tender, non-distended MUSCULOSKELETAL:  No edema; No deformity  SKIN: Warm and dry NEUROLOGIC:  Alert and oriented x 3 PSYCHIATRIC:  Normal affect   ASSESSMENT:    1. CAD S/P percutaneous coronary angioplasty   2. Chronic combined systolic and diastolic CHF (congestive heart failure) (HCC)   3. Primary hypertension   4. Hyperlipidemia with target LDL less than 70   5. Carotid stenosis, right   6. Syncope and collapse   7. CKD (chronic kidney disease) stage 4, GFR 15-29 ml/min (HCC)    PLAN:    In order of problems listed above:  CAD DES-ostial RCA  01/2023 Aspirin and Plavix x 12 months 100 mg Toprol, 60 mg Imdur, 40 mg Lipitor   Chronic HFmrEF LVEF known to be 45-50% GDMT: Toprol, Imdur Given renal function, will now avoid ACEI/ARB/ARNI/SGLT2i   Hypertension 10 mg amlodipine, 0.1 mg clonidine twice daily, 60 mg Imdur, 100 mg Toprol - reports BP not controlled at home - will keep a BP log - if uncontrolled, will increased imdur   Hyperlipidemia with LDL goal less than 70 04/16/2023: Cholesterol 104; HDL 30; LDL Cholesterol 46; Triglycerides 138; VLDL 28 Continue 40 mg lipitor   CKD 3-4 - following with nephrology   ?syncope - counseled on driving restrictions for 6 months - overall she is  not sure how or why she fell and thinks she passed out - question if this is due  to diarrhea    Medication Adjustments/Labs and Tests Ordered: Current medicines are reviewed at length with the patient today.  Concerns regarding medicines are outlined above.  No orders of the defined types were placed in this encounter.  No orders of the defined types were placed in this encounter.   Patient Instructions  Medication Instructions:  No medication changes *If you need a refill on your cardiac medications before your next appointment, please call your pharmacy*   Lab Work: none If you have labs (blood work) drawn today and your tests are completely normal, you will receive your results only by: MyChart Message (if you have MyChart) OR A paper copy in the mail If you have any lab test that is abnormal or we need to change your treatment, we will call you to review the results.   Testing/Procedures: none   Follow-Up: At [facility], you and your health needs are our priority.  As part of our continuing mission to provide you with exceptional heart care, we have created designated Provider Care Teams.  These Care Teams include your primary Cardiologist (physician) and Advanced Practice Providers (APPs -  Physician  Assistants and Nurse Practitioners) who all work together to provide you with the care you need, when you need it.  We recommend signing up for the patient portal called "MyChart".  Sign up information is provided on this After Visit Summary.  MyChart is used to connect with patients for Virtual Visits (Telemedicine).  Patients are able to view lab/test results, encounter notes, upcoming appointments, etc.  Non-urgent messages can be sent to your provider as well.   To learn more about what you can do with MyChart, go to [email or web address].    Your next appointment:   4-6 week(s)  Provider:   Micah Flesher, PA-C             Signed, Marcelino Duster, [place]  05/21/2023 3:39 PM    [facility]

## 2023-05-12 ENCOUNTER — Ambulatory Visit (HOSPITAL_COMMUNITY): Payer: Medicare HMO

## 2023-05-12 DIAGNOSIS — J440 Chronic obstructive pulmonary disease with acute lower respiratory infection: Secondary | ICD-10-CM | POA: Diagnosis not present

## 2023-05-12 DIAGNOSIS — I130 Hypertensive heart and chronic kidney disease with heart failure and stage 1 through stage 4 chronic kidney disease, or unspecified chronic kidney disease: Secondary | ICD-10-CM | POA: Diagnosis not present

## 2023-05-12 DIAGNOSIS — J189 Pneumonia, unspecified organism: Secondary | ICD-10-CM | POA: Diagnosis not present

## 2023-05-12 DIAGNOSIS — B958 Unspecified staphylococcus as the cause of diseases classified elsewhere: Secondary | ICD-10-CM | POA: Diagnosis not present

## 2023-05-12 DIAGNOSIS — N390 Urinary tract infection, site not specified: Secondary | ICD-10-CM | POA: Diagnosis not present

## 2023-05-12 DIAGNOSIS — I509 Heart failure, unspecified: Secondary | ICD-10-CM | POA: Diagnosis not present

## 2023-05-12 DIAGNOSIS — G9341 Metabolic encephalopathy: Secondary | ICD-10-CM | POA: Diagnosis not present

## 2023-05-12 DIAGNOSIS — E860 Dehydration: Secondary | ICD-10-CM | POA: Diagnosis not present

## 2023-05-14 ENCOUNTER — Ambulatory Visit (HOSPITAL_COMMUNITY): Payer: Medicare HMO

## 2023-05-15 DIAGNOSIS — N390 Urinary tract infection, site not specified: Secondary | ICD-10-CM | POA: Diagnosis not present

## 2023-05-15 DIAGNOSIS — E860 Dehydration: Secondary | ICD-10-CM | POA: Diagnosis not present

## 2023-05-15 DIAGNOSIS — B958 Unspecified staphylococcus as the cause of diseases classified elsewhere: Secondary | ICD-10-CM | POA: Diagnosis not present

## 2023-05-15 DIAGNOSIS — I509 Heart failure, unspecified: Secondary | ICD-10-CM | POA: Diagnosis not present

## 2023-05-15 DIAGNOSIS — I130 Hypertensive heart and chronic kidney disease with heart failure and stage 1 through stage 4 chronic kidney disease, or unspecified chronic kidney disease: Secondary | ICD-10-CM | POA: Diagnosis not present

## 2023-05-15 DIAGNOSIS — J189 Pneumonia, unspecified organism: Secondary | ICD-10-CM | POA: Diagnosis not present

## 2023-05-15 DIAGNOSIS — J440 Chronic obstructive pulmonary disease with acute lower respiratory infection: Secondary | ICD-10-CM | POA: Diagnosis not present

## 2023-05-15 DIAGNOSIS — G9341 Metabolic encephalopathy: Secondary | ICD-10-CM | POA: Diagnosis not present

## 2023-05-15 DIAGNOSIS — M6282 Rhabdomyolysis: Secondary | ICD-10-CM | POA: Diagnosis not present

## 2023-05-16 ENCOUNTER — Ambulatory Visit (HOSPITAL_COMMUNITY): Payer: Medicare HMO

## 2023-05-19 ENCOUNTER — Ambulatory Visit (HOSPITAL_COMMUNITY): Payer: Medicare HMO

## 2023-05-19 DIAGNOSIS — E860 Dehydration: Secondary | ICD-10-CM | POA: Diagnosis not present

## 2023-05-19 DIAGNOSIS — I130 Hypertensive heart and chronic kidney disease with heart failure and stage 1 through stage 4 chronic kidney disease, or unspecified chronic kidney disease: Secondary | ICD-10-CM | POA: Diagnosis not present

## 2023-05-19 DIAGNOSIS — I509 Heart failure, unspecified: Secondary | ICD-10-CM | POA: Diagnosis not present

## 2023-05-19 DIAGNOSIS — J189 Pneumonia, unspecified organism: Secondary | ICD-10-CM | POA: Diagnosis not present

## 2023-05-19 DIAGNOSIS — B958 Unspecified staphylococcus as the cause of diseases classified elsewhere: Secondary | ICD-10-CM | POA: Diagnosis not present

## 2023-05-19 DIAGNOSIS — J440 Chronic obstructive pulmonary disease with acute lower respiratory infection: Secondary | ICD-10-CM | POA: Diagnosis not present

## 2023-05-19 DIAGNOSIS — M6282 Rhabdomyolysis: Secondary | ICD-10-CM | POA: Diagnosis not present

## 2023-05-19 DIAGNOSIS — N390 Urinary tract infection, site not specified: Secondary | ICD-10-CM | POA: Diagnosis not present

## 2023-05-19 DIAGNOSIS — G9341 Metabolic encephalopathy: Secondary | ICD-10-CM | POA: Diagnosis not present

## 2023-05-21 ENCOUNTER — Encounter: Payer: Self-pay | Admitting: Physician Assistant

## 2023-05-21 ENCOUNTER — Ambulatory Visit: Payer: Medicare HMO | Attending: Physician Assistant | Admitting: Physician Assistant

## 2023-05-21 VITALS — BP 118/74 | HR 55 | Ht 63.0 in | Wt 166.2 lb

## 2023-05-21 DIAGNOSIS — E785 Hyperlipidemia, unspecified: Secondary | ICD-10-CM

## 2023-05-21 DIAGNOSIS — I10 Essential (primary) hypertension: Secondary | ICD-10-CM

## 2023-05-21 DIAGNOSIS — I5042 Chronic combined systolic (congestive) and diastolic (congestive) heart failure: Secondary | ICD-10-CM

## 2023-05-21 DIAGNOSIS — I2510 Atherosclerotic heart disease of native coronary artery without angina pectoris: Secondary | ICD-10-CM

## 2023-05-21 DIAGNOSIS — N184 Chronic kidney disease, stage 4 (severe): Secondary | ICD-10-CM | POA: Diagnosis not present

## 2023-05-21 DIAGNOSIS — I6521 Occlusion and stenosis of right carotid artery: Secondary | ICD-10-CM | POA: Diagnosis not present

## 2023-05-21 DIAGNOSIS — R55 Syncope and collapse: Secondary | ICD-10-CM | POA: Diagnosis not present

## 2023-05-21 DIAGNOSIS — Z9861 Coronary angioplasty status: Secondary | ICD-10-CM

## 2023-05-21 NOTE — Patient Instructions (Signed)
Medication Instructions:  No medication changes *If you need a refill on your cardiac medications before your next appointment, please call your pharmacy*   Lab Work: none If you have labs (blood work) drawn today and your tests are completely normal, you will receive your results only by: MyChart Message (if you have MyChart) OR A paper copy in the mail If you have any lab test that is abnormal or we need to change your treatment, we will call you to review the results.   Testing/Procedures: none   Follow-Up: At [facility], you and your health needs are our priority.  As part of our continuing mission to provide you with exceptional heart care, we have created designated Provider Care Teams.  These Care Teams include your primary Cardiologist (physician) and Advanced Practice Providers (APPs -  Physician Assistants and Nurse Practitioners) who all work together to provide you with the care you need, when you need it.  We recommend signing up for the patient portal called "MyChart".  Sign up information is provided on this After Visit Summary.  MyChart is used to connect with patients for Virtual Visits (Telemedicine).  Patients are able to view lab/test results, encounter notes, upcoming appointments, etc.  Non-urgent messages can be sent to your provider as well.   To learn more about what you can do with MyChart, go to [email or web address].    Your next appointment:   4-6 week(s)  Provider:   Micah Flesher, PA-C

## 2023-05-23 ENCOUNTER — Ambulatory Visit (HOSPITAL_COMMUNITY): Payer: Medicare HMO

## 2023-05-23 DIAGNOSIS — M6282 Rhabdomyolysis: Secondary | ICD-10-CM | POA: Diagnosis not present

## 2023-05-23 DIAGNOSIS — J440 Chronic obstructive pulmonary disease with acute lower respiratory infection: Secondary | ICD-10-CM | POA: Diagnosis not present

## 2023-05-23 DIAGNOSIS — E860 Dehydration: Secondary | ICD-10-CM | POA: Diagnosis not present

## 2023-05-23 DIAGNOSIS — J189 Pneumonia, unspecified organism: Secondary | ICD-10-CM | POA: Diagnosis not present

## 2023-05-23 DIAGNOSIS — I509 Heart failure, unspecified: Secondary | ICD-10-CM | POA: Diagnosis not present

## 2023-05-23 DIAGNOSIS — B958 Unspecified staphylococcus as the cause of diseases classified elsewhere: Secondary | ICD-10-CM | POA: Diagnosis not present

## 2023-05-23 DIAGNOSIS — N390 Urinary tract infection, site not specified: Secondary | ICD-10-CM | POA: Diagnosis not present

## 2023-05-23 DIAGNOSIS — G9341 Metabolic encephalopathy: Secondary | ICD-10-CM | POA: Diagnosis not present

## 2023-05-23 DIAGNOSIS — I130 Hypertensive heart and chronic kidney disease with heart failure and stage 1 through stage 4 chronic kidney disease, or unspecified chronic kidney disease: Secondary | ICD-10-CM | POA: Diagnosis not present

## 2023-05-28 DIAGNOSIS — I130 Hypertensive heart and chronic kidney disease with heart failure and stage 1 through stage 4 chronic kidney disease, or unspecified chronic kidney disease: Secondary | ICD-10-CM | POA: Diagnosis not present

## 2023-05-28 DIAGNOSIS — N184 Chronic kidney disease, stage 4 (severe): Secondary | ICD-10-CM | POA: Diagnosis not present

## 2023-05-28 DIAGNOSIS — R197 Diarrhea, unspecified: Secondary | ICD-10-CM | POA: Diagnosis not present

## 2023-05-28 DIAGNOSIS — K3184 Gastroparesis: Secondary | ICD-10-CM | POA: Diagnosis not present

## 2023-05-28 DIAGNOSIS — Z5181 Encounter for therapeutic drug level monitoring: Secondary | ICD-10-CM | POA: Diagnosis not present

## 2023-05-28 DIAGNOSIS — K861 Other chronic pancreatitis: Secondary | ICD-10-CM | POA: Diagnosis not present

## 2023-05-29 DIAGNOSIS — I130 Hypertensive heart and chronic kidney disease with heart failure and stage 1 through stage 4 chronic kidney disease, or unspecified chronic kidney disease: Secondary | ICD-10-CM | POA: Diagnosis not present

## 2023-05-29 DIAGNOSIS — E860 Dehydration: Secondary | ICD-10-CM | POA: Diagnosis not present

## 2023-05-29 DIAGNOSIS — N390 Urinary tract infection, site not specified: Secondary | ICD-10-CM | POA: Diagnosis not present

## 2023-05-29 DIAGNOSIS — J440 Chronic obstructive pulmonary disease with acute lower respiratory infection: Secondary | ICD-10-CM | POA: Diagnosis not present

## 2023-05-29 DIAGNOSIS — B958 Unspecified staphylococcus as the cause of diseases classified elsewhere: Secondary | ICD-10-CM | POA: Diagnosis not present

## 2023-05-29 DIAGNOSIS — G9341 Metabolic encephalopathy: Secondary | ICD-10-CM | POA: Diagnosis not present

## 2023-05-29 DIAGNOSIS — J189 Pneumonia, unspecified organism: Secondary | ICD-10-CM | POA: Diagnosis not present

## 2023-05-29 DIAGNOSIS — I509 Heart failure, unspecified: Secondary | ICD-10-CM | POA: Diagnosis not present

## 2023-05-29 DIAGNOSIS — M6282 Rhabdomyolysis: Secondary | ICD-10-CM | POA: Diagnosis not present

## 2023-06-03 DIAGNOSIS — N390 Urinary tract infection, site not specified: Secondary | ICD-10-CM | POA: Diagnosis not present

## 2023-06-03 DIAGNOSIS — J440 Chronic obstructive pulmonary disease with acute lower respiratory infection: Secondary | ICD-10-CM | POA: Diagnosis not present

## 2023-06-03 DIAGNOSIS — E860 Dehydration: Secondary | ICD-10-CM | POA: Diagnosis not present

## 2023-06-03 DIAGNOSIS — B958 Unspecified staphylococcus as the cause of diseases classified elsewhere: Secondary | ICD-10-CM | POA: Diagnosis not present

## 2023-06-03 DIAGNOSIS — G9341 Metabolic encephalopathy: Secondary | ICD-10-CM | POA: Diagnosis not present

## 2023-06-03 DIAGNOSIS — J189 Pneumonia, unspecified organism: Secondary | ICD-10-CM | POA: Diagnosis not present

## 2023-06-03 DIAGNOSIS — M6282 Rhabdomyolysis: Secondary | ICD-10-CM | POA: Diagnosis not present

## 2023-06-03 DIAGNOSIS — I130 Hypertensive heart and chronic kidney disease with heart failure and stage 1 through stage 4 chronic kidney disease, or unspecified chronic kidney disease: Secondary | ICD-10-CM | POA: Diagnosis not present

## 2023-06-03 DIAGNOSIS — I509 Heart failure, unspecified: Secondary | ICD-10-CM | POA: Diagnosis not present

## 2023-06-04 ENCOUNTER — Emergency Department (HOSPITAL_COMMUNITY): Payer: Medicare HMO

## 2023-06-04 ENCOUNTER — Other Ambulatory Visit: Payer: Self-pay

## 2023-06-04 ENCOUNTER — Inpatient Hospital Stay (HOSPITAL_COMMUNITY): Payer: Medicare HMO

## 2023-06-04 ENCOUNTER — Inpatient Hospital Stay (HOSPITAL_COMMUNITY)
Admission: EM | Admit: 2023-06-04 | Discharge: 2023-07-03 | DRG: 870 | Disposition: A | Discharge status: Skilled Nursing Facility | Payer: Medicare HMO | Attending: Internal Medicine | Admitting: Internal Medicine

## 2023-06-04 ENCOUNTER — Encounter (HOSPITAL_COMMUNITY): Payer: Self-pay | Admitting: Critical Care Medicine

## 2023-06-04 DIAGNOSIS — J9601 Acute respiratory failure with hypoxia: Principal | ICD-10-CM | POA: Diagnosis present

## 2023-06-04 DIAGNOSIS — R6520 Severe sepsis without septic shock: Secondary | ICD-10-CM | POA: Diagnosis present

## 2023-06-04 DIAGNOSIS — R41841 Cognitive communication deficit: Secondary | ICD-10-CM | POA: Diagnosis not present

## 2023-06-04 DIAGNOSIS — K8681 Exocrine pancreatic insufficiency: Secondary | ICD-10-CM | POA: Diagnosis present

## 2023-06-04 DIAGNOSIS — Z955 Presence of coronary angioplasty implant and graft: Secondary | ICD-10-CM | POA: Diagnosis not present

## 2023-06-04 DIAGNOSIS — R410 Disorientation, unspecified: Secondary | ICD-10-CM | POA: Diagnosis not present

## 2023-06-04 DIAGNOSIS — R6521 Severe sepsis with septic shock: Secondary | ICD-10-CM | POA: Diagnosis not present

## 2023-06-04 DIAGNOSIS — J80 Acute respiratory distress syndrome: Secondary | ICD-10-CM | POA: Diagnosis not present

## 2023-06-04 DIAGNOSIS — Z5329 Procedure and treatment not carried out because of patient's decision for other reasons: Secondary | ICD-10-CM | POA: Diagnosis not present

## 2023-06-04 DIAGNOSIS — E871 Hypo-osmolality and hyponatremia: Secondary | ICD-10-CM | POA: Diagnosis not present

## 2023-06-04 DIAGNOSIS — N184 Chronic kidney disease, stage 4 (severe): Secondary | ICD-10-CM | POA: Diagnosis present

## 2023-06-04 DIAGNOSIS — T380X5A Adverse effect of glucocorticoids and synthetic analogues, initial encounter: Secondary | ICD-10-CM | POA: Diagnosis present

## 2023-06-04 DIAGNOSIS — E11649 Type 2 diabetes mellitus with hypoglycemia without coma: Secondary | ICD-10-CM | POA: Diagnosis present

## 2023-06-04 DIAGNOSIS — E041 Nontoxic single thyroid nodule: Secondary | ICD-10-CM | POA: Diagnosis not present

## 2023-06-04 DIAGNOSIS — Z7982 Long term (current) use of aspirin: Secondary | ICD-10-CM

## 2023-06-04 DIAGNOSIS — R471 Dysarthria and anarthria: Secondary | ICD-10-CM | POA: Diagnosis present

## 2023-06-04 DIAGNOSIS — J9600 Acute respiratory failure, unspecified whether with hypoxia or hypercapnia: Principal | ICD-10-CM

## 2023-06-04 DIAGNOSIS — Z9861 Coronary angioplasty status: Secondary | ICD-10-CM | POA: Diagnosis not present

## 2023-06-04 DIAGNOSIS — D631 Anemia in chronic kidney disease: Secondary | ICD-10-CM | POA: Diagnosis present

## 2023-06-04 DIAGNOSIS — I130 Hypertensive heart and chronic kidney disease with heart failure and stage 1 through stage 4 chronic kidney disease, or unspecified chronic kidney disease: Secondary | ICD-10-CM | POA: Diagnosis not present

## 2023-06-04 DIAGNOSIS — Z91041 Radiographic dye allergy status: Secondary | ICD-10-CM

## 2023-06-04 DIAGNOSIS — Z885 Allergy status to narcotic agent status: Secondary | ICD-10-CM

## 2023-06-04 DIAGNOSIS — Z933 Colostomy status: Secondary | ICD-10-CM

## 2023-06-04 DIAGNOSIS — I129 Hypertensive chronic kidney disease with stage 1 through stage 4 chronic kidney disease, or unspecified chronic kidney disease: Secondary | ICD-10-CM | POA: Diagnosis present

## 2023-06-04 DIAGNOSIS — E785 Hyperlipidemia, unspecified: Secondary | ICD-10-CM | POA: Diagnosis present

## 2023-06-04 DIAGNOSIS — I639 Cerebral infarction, unspecified: Secondary | ICD-10-CM | POA: Diagnosis not present

## 2023-06-04 DIAGNOSIS — R0902 Hypoxemia: Secondary | ICD-10-CM | POA: Diagnosis not present

## 2023-06-04 DIAGNOSIS — Z823 Family history of stroke: Secondary | ICD-10-CM

## 2023-06-04 DIAGNOSIS — R0689 Other abnormalities of breathing: Secondary | ICD-10-CM | POA: Diagnosis not present

## 2023-06-04 DIAGNOSIS — Z95828 Presence of other vascular implants and grafts: Secondary | ICD-10-CM | POA: Diagnosis not present

## 2023-06-04 DIAGNOSIS — I2510 Atherosclerotic heart disease of native coronary artery without angina pectoris: Secondary | ICD-10-CM

## 2023-06-04 DIAGNOSIS — K219 Gastro-esophageal reflux disease without esophagitis: Secondary | ICD-10-CM | POA: Diagnosis present

## 2023-06-04 DIAGNOSIS — G9341 Metabolic encephalopathy: Secondary | ICD-10-CM | POA: Diagnosis not present

## 2023-06-04 DIAGNOSIS — Z888 Allergy status to other drugs, medicaments and biological substances status: Secondary | ICD-10-CM

## 2023-06-04 DIAGNOSIS — E1151 Type 2 diabetes mellitus with diabetic peripheral angiopathy without gangrene: Secondary | ICD-10-CM | POA: Diagnosis not present

## 2023-06-04 DIAGNOSIS — K861 Other chronic pancreatitis: Secondary | ICD-10-CM | POA: Diagnosis present

## 2023-06-04 DIAGNOSIS — I5023 Acute on chronic systolic (congestive) heart failure: Secondary | ICD-10-CM | POA: Diagnosis not present

## 2023-06-04 DIAGNOSIS — F419 Anxiety disorder, unspecified: Secondary | ICD-10-CM | POA: Diagnosis present

## 2023-06-04 DIAGNOSIS — R627 Adult failure to thrive: Secondary | ICD-10-CM | POA: Diagnosis present

## 2023-06-04 DIAGNOSIS — Z4682 Encounter for fitting and adjustment of non-vascular catheter: Secondary | ICD-10-CM | POA: Diagnosis not present

## 2023-06-04 DIAGNOSIS — E870 Hyperosmolality and hypernatremia: Secondary | ICD-10-CM | POA: Diagnosis present

## 2023-06-04 DIAGNOSIS — K922 Gastrointestinal hemorrhage, unspecified: Secondary | ICD-10-CM

## 2023-06-04 DIAGNOSIS — Z8673 Personal history of transient ischemic attack (TIA), and cerebral infarction without residual deficits: Secondary | ICD-10-CM | POA: Diagnosis not present

## 2023-06-04 DIAGNOSIS — Z7902 Long term (current) use of antithrombotics/antiplatelets: Secondary | ICD-10-CM

## 2023-06-04 DIAGNOSIS — Y95 Nosocomial condition: Secondary | ICD-10-CM | POA: Diagnosis present

## 2023-06-04 DIAGNOSIS — F32A Depression, unspecified: Secondary | ICD-10-CM | POA: Diagnosis present

## 2023-06-04 DIAGNOSIS — J9690 Respiratory failure, unspecified, unspecified whether with hypoxia or hypercapnia: Secondary | ICD-10-CM | POA: Diagnosis not present

## 2023-06-04 DIAGNOSIS — G40909 Epilepsy, unspecified, not intractable, without status epilepticus: Secondary | ICD-10-CM | POA: Diagnosis present

## 2023-06-04 DIAGNOSIS — I25810 Atherosclerosis of coronary artery bypass graft(s) without angina pectoris: Secondary | ICD-10-CM | POA: Diagnosis not present

## 2023-06-04 DIAGNOSIS — I10 Essential (primary) hypertension: Secondary | ICD-10-CM | POA: Diagnosis not present

## 2023-06-04 DIAGNOSIS — M6281 Muscle weakness (generalized): Secondary | ICD-10-CM | POA: Diagnosis not present

## 2023-06-04 DIAGNOSIS — K760 Fatty (change of) liver, not elsewhere classified: Secondary | ICD-10-CM | POA: Diagnosis present

## 2023-06-04 DIAGNOSIS — R278 Other lack of coordination: Secondary | ICD-10-CM | POA: Diagnosis not present

## 2023-06-04 DIAGNOSIS — R569 Unspecified convulsions: Secondary | ICD-10-CM | POA: Diagnosis not present

## 2023-06-04 DIAGNOSIS — Z8249 Family history of ischemic heart disease and other diseases of the circulatory system: Secondary | ICD-10-CM

## 2023-06-04 DIAGNOSIS — J189 Pneumonia, unspecified organism: Secondary | ICD-10-CM | POA: Diagnosis not present

## 2023-06-04 DIAGNOSIS — R0602 Shortness of breath: Secondary | ICD-10-CM | POA: Diagnosis not present

## 2023-06-04 DIAGNOSIS — R1312 Dysphagia, oropharyngeal phase: Secondary | ICD-10-CM | POA: Diagnosis not present

## 2023-06-04 DIAGNOSIS — R140 Abdominal distension (gaseous): Secondary | ICD-10-CM | POA: Diagnosis not present

## 2023-06-04 DIAGNOSIS — E872 Acidosis, unspecified: Secondary | ICD-10-CM | POA: Diagnosis not present

## 2023-06-04 DIAGNOSIS — Z741 Need for assistance with personal care: Secondary | ICD-10-CM | POA: Diagnosis not present

## 2023-06-04 DIAGNOSIS — A419 Sepsis, unspecified organism: Principal | ICD-10-CM | POA: Diagnosis present

## 2023-06-04 DIAGNOSIS — G4733 Obstructive sleep apnea (adult) (pediatric): Secondary | ICD-10-CM | POA: Diagnosis present

## 2023-06-04 DIAGNOSIS — J440 Chronic obstructive pulmonary disease with acute lower respiratory infection: Secondary | ICD-10-CM | POA: Diagnosis present

## 2023-06-04 DIAGNOSIS — Z1152 Encounter for screening for COVID-19: Secondary | ICD-10-CM | POA: Diagnosis not present

## 2023-06-04 DIAGNOSIS — Z79899 Other long term (current) drug therapy: Secondary | ICD-10-CM

## 2023-06-04 DIAGNOSIS — E876 Hypokalemia: Secondary | ICD-10-CM | POA: Diagnosis present

## 2023-06-04 DIAGNOSIS — G4700 Insomnia, unspecified: Secondary | ICD-10-CM | POA: Diagnosis present

## 2023-06-04 DIAGNOSIS — R000 Tachycardia, unspecified: Secondary | ICD-10-CM | POA: Diagnosis not present

## 2023-06-04 DIAGNOSIS — J181 Lobar pneumonia, unspecified organism: Secondary | ICD-10-CM | POA: Diagnosis not present

## 2023-06-04 DIAGNOSIS — E874 Mixed disorder of acid-base balance: Secondary | ICD-10-CM | POA: Diagnosis present

## 2023-06-04 DIAGNOSIS — J984 Other disorders of lung: Secondary | ICD-10-CM | POA: Diagnosis not present

## 2023-06-04 DIAGNOSIS — Z91040 Latex allergy status: Secondary | ICD-10-CM

## 2023-06-04 DIAGNOSIS — I6522 Occlusion and stenosis of left carotid artery: Secondary | ICD-10-CM | POA: Diagnosis not present

## 2023-06-04 DIAGNOSIS — Z9911 Dependence on respirator [ventilator] status: Secondary | ICD-10-CM | POA: Diagnosis not present

## 2023-06-04 DIAGNOSIS — I700 Atherosclerosis of aorta: Secondary | ICD-10-CM | POA: Diagnosis not present

## 2023-06-04 DIAGNOSIS — N179 Acute kidney failure, unspecified: Secondary | ICD-10-CM | POA: Diagnosis not present

## 2023-06-04 DIAGNOSIS — D649 Anemia, unspecified: Secondary | ICD-10-CM | POA: Diagnosis not present

## 2023-06-04 DIAGNOSIS — R0989 Other specified symptoms and signs involving the circulatory and respiratory systems: Secondary | ICD-10-CM | POA: Diagnosis not present

## 2023-06-04 DIAGNOSIS — E1122 Type 2 diabetes mellitus with diabetic chronic kidney disease: Secondary | ICD-10-CM | POA: Diagnosis present

## 2023-06-04 DIAGNOSIS — R4182 Altered mental status, unspecified: Secondary | ICD-10-CM | POA: Diagnosis not present

## 2023-06-04 DIAGNOSIS — K5731 Diverticulosis of large intestine without perforation or abscess with bleeding: Secondary | ICD-10-CM | POA: Diagnosis not present

## 2023-06-04 DIAGNOSIS — Z91048 Other nonmedicinal substance allergy status: Secondary | ICD-10-CM

## 2023-06-04 DIAGNOSIS — M6259 Muscle wasting and atrophy, not elsewhere classified, multiple sites: Secondary | ICD-10-CM | POA: Diagnosis not present

## 2023-06-04 DIAGNOSIS — Z8701 Personal history of pneumonia (recurrent): Secondary | ICD-10-CM

## 2023-06-04 DIAGNOSIS — I517 Cardiomegaly: Secondary | ICD-10-CM | POA: Diagnosis not present

## 2023-06-04 DIAGNOSIS — R918 Other nonspecific abnormal finding of lung field: Secondary | ICD-10-CM | POA: Diagnosis not present

## 2023-06-04 DIAGNOSIS — R339 Retention of urine, unspecified: Secondary | ICD-10-CM | POA: Diagnosis present

## 2023-06-04 DIAGNOSIS — G9340 Encephalopathy, unspecified: Secondary | ICD-10-CM | POA: Diagnosis not present

## 2023-06-04 DIAGNOSIS — E1165 Type 2 diabetes mellitus with hyperglycemia: Secondary | ICD-10-CM | POA: Diagnosis present

## 2023-06-04 DIAGNOSIS — Z86718 Personal history of other venous thrombosis and embolism: Secondary | ICD-10-CM

## 2023-06-04 DIAGNOSIS — Z882 Allergy status to sulfonamides status: Secondary | ICD-10-CM

## 2023-06-04 DIAGNOSIS — E538 Deficiency of other specified B group vitamins: Secondary | ICD-10-CM | POA: Diagnosis present

## 2023-06-04 DIAGNOSIS — E860 Dehydration: Secondary | ICD-10-CM | POA: Diagnosis present

## 2023-06-04 DIAGNOSIS — G3189 Other specified degenerative diseases of nervous system: Secondary | ICD-10-CM | POA: Diagnosis not present

## 2023-06-04 DIAGNOSIS — J90 Pleural effusion, not elsewhere classified: Secondary | ICD-10-CM | POA: Diagnosis not present

## 2023-06-04 DIAGNOSIS — N189 Chronic kidney disease, unspecified: Secondary | ICD-10-CM | POA: Diagnosis not present

## 2023-06-04 DIAGNOSIS — R071 Chest pain on breathing: Secondary | ICD-10-CM | POA: Diagnosis not present

## 2023-06-04 LAB — RESPIRATORY PANEL BY PCR

## 2023-06-04 LAB — CBC WITH DIFFERENTIAL/PLATELET
Abs Immature Granulocytes: 0.04 10*3/uL (ref 0.00–0.07)
Basophils Absolute: 0 10*3/uL (ref 0.0–0.1)
Basophils Relative: 0 %
Eosinophils Absolute: 0.1 10*3/uL (ref 0.0–0.5)
Eosinophils Relative: 1 %
HCT: 39.4 % (ref 36.0–46.0)
Hemoglobin: 11.9 g/dL — ABNORMAL LOW (ref 12.0–15.0)
Immature Granulocytes: 0 %
Lymphocytes Relative: 14 %
Lymphs Abs: 1.5 10*3/uL (ref 0.7–4.0)
MCH: 28.4 pg (ref 26.0–34.0)
MCHC: 30.2 g/dL (ref 30.0–36.0)
MCV: 94 fL (ref 80.0–100.0)
Monocytes Absolute: 0.3 10*3/uL (ref 0.1–1.0)
Monocytes Relative: 3 %
Neutro Abs: 8.5 10*3/uL — ABNORMAL HIGH (ref 1.7–7.7)
Neutrophils Relative %: 82 %
Platelets: 260 10*3/uL (ref 150–400)
RBC: 4.19 MIL/uL (ref 3.87–5.11)
RDW: 13.3 % (ref 11.5–15.5)
WBC: 10.4 10*3/uL (ref 4.0–10.5)
nRBC: 0 % (ref 0.0–0.2)

## 2023-06-04 LAB — BASIC METABOLIC PANEL
Anion gap: 15 (ref 5–15)
BUN: 25 mg/dL — ABNORMAL HIGH (ref 8–23)
CO2: 17 mmol/L — ABNORMAL LOW (ref 22–32)
Calcium: 8 mg/dL — ABNORMAL LOW (ref 8.9–10.3)
Chloride: 104 mmol/L (ref 98–111)
Creatinine, Ser: 2.17 mg/dL — ABNORMAL HIGH (ref 0.44–1.00)
GFR, Estimated: 23 mL/min — ABNORMAL LOW (ref >60)
Glucose, Bld: 116 mg/dL — ABNORMAL HIGH (ref 70–99)
Potassium: 3.7 mmol/L (ref 3.5–5.1)
Sodium: 136 mmol/L (ref 135–145)

## 2023-06-04 LAB — RESP PANEL BY RT-PCR (RSV, FLU A&B, COVID)  RVPGX2
Influenza A by PCR: NEGATIVE
Influenza B by PCR: NEGATIVE
Resp Syncytial Virus by PCR: NEGATIVE
SARS Coronavirus 2 by RT PCR: NEGATIVE

## 2023-06-04 LAB — POCT I-STAT 7, (LYTES, BLD GAS, ICA,H+H)
Acid-base deficit: 13 mmol/L — ABNORMAL HIGH (ref 0.0–2.0)
Bicarbonate: 19.1 mmol/L — ABNORMAL LOW (ref 20.0–28.0)
Calcium, Ion: 1.19 mmol/L (ref 1.15–1.40)
HCT: 41 % (ref 36.0–46.0)
Hemoglobin: 13.9 g/dL (ref 12.0–15.0)
O2 Saturation: 98 %
Patient temperature: 98.3
Potassium: 3.4 mmol/L — ABNORMAL LOW (ref 3.5–5.1)
Sodium: 139 mmol/L (ref 135–145)
TCO2: 21 mmol/L — ABNORMAL LOW (ref 22–32)
pCO2 arterial: 75.9 mmHg (ref 32–48)
pH, Arterial: 7.008 — CL (ref 7.35–7.45)
pO2, Arterial: 146 mmHg — ABNORMAL HIGH (ref 83–108)

## 2023-06-04 LAB — STREP PNEUMONIAE URINARY ANTIGEN: Strep Pneumo Urinary Antigen: NEGATIVE

## 2023-06-04 LAB — GLUCOSE, CAPILLARY
Glucose-Capillary: 109 mg/dL — ABNORMAL HIGH (ref 70–99)
Glucose-Capillary: 173 mg/dL — ABNORMAL HIGH (ref 70–99)
Glucose-Capillary: 171 mg/dL — ABNORMAL HIGH (ref 70–99)
Glucose-Capillary: 167 mg/dL — ABNORMAL HIGH (ref 70–99)
Glucose-Capillary: 202 mg/dL — ABNORMAL HIGH (ref 70–99)

## 2023-06-04 LAB — TROPONIN I (HIGH SENSITIVITY)
Troponin I (High Sensitivity): 19 ng/L — ABNORMAL HIGH (ref <18)
Troponin I (High Sensitivity): 38 ng/L — ABNORMAL HIGH (ref <18)

## 2023-06-04 LAB — BRAIN NATRIURETIC PEPTIDE: B Natriuretic Peptide: 70.7 pg/mL (ref 0.0–100.0)

## 2023-06-04 LAB — LACTIC ACID, PLASMA
Lactic Acid, Venous: 4 mmol/L (ref 0.5–1.9)
Lactic Acid, Venous: 2.7 mmol/L (ref 0.5–1.9)

## 2023-06-04 LAB — MRSA NEXT GEN BY PCR, NASAL: MRSA by PCR Next Gen: DETECTED — AB

## 2023-06-04 MED ORDER — FENTANYL 2500MCG IN NS 250ML (10MCG/ML) PREMIX INFUSION
0.0000 ug/h | INTRAVENOUS | Status: DC
  Administered 2023-06-04: 200 ug/h via INTRAVENOUS
  Administered 2023-06-04: 50 ug/h via INTRAVENOUS
  Administered 2023-06-05 – 2023-06-07 (×5): 200 ug/h via INTRAVENOUS
  Administered 2023-06-07: 150 ug/h via INTRAVENOUS
  Administered 2023-06-08: 200 ug/h via INTRAVENOUS
  Administered 2023-06-08: 150 ug/h via INTRAVENOUS
  Administered 2023-06-09 (×2): 200 ug/h via INTRAVENOUS
  Filled 2023-06-04 (×12): qty 250

## 2023-06-04 MED ORDER — ETOMIDATE 2 MG/ML IV SOLN
INTRAVENOUS | Status: AC
  Filled 2023-06-04: qty 20

## 2023-06-04 MED ORDER — POLYETHYLENE GLYCOL 3350 17 G PO PACK
17.0000 g | PACK | Freq: Every day | ORAL | Status: DC
  Administered 2023-06-04 – 2023-06-05 (×2): 17 g
  Filled 2023-06-04 (×2): qty 1

## 2023-06-04 MED ORDER — ROCURONIUM BROMIDE 10 MG/ML (PF) SYRINGE
PREFILLED_SYRINGE | INTRAVENOUS | Status: AC
  Administered 2023-06-04: 70 mg via INTRAVENOUS
  Filled 2023-06-04: qty 10

## 2023-06-04 MED ORDER — MIDAZOLAM HCL 2 MG/2ML IJ SOLN
1.0000 mg | INTRAMUSCULAR | Status: DC | PRN
  Administered 2023-06-04 – 2023-06-06 (×10): 2 mg via INTRAVENOUS
  Administered 2023-06-07: 1 mg via INTRAVENOUS
  Administered 2023-06-07: 2 mg via INTRAVENOUS
  Filled 2023-06-04 (×13): qty 2

## 2023-06-04 MED ORDER — VITAL 1.5 CAL PO LIQD
1000.0000 mL | ORAL | Status: DC
  Administered 2023-06-04 – 2023-06-05 (×6): 1000 mL

## 2023-06-04 MED ORDER — POLYETHYLENE GLYCOL 3350 17 G PO PACK: 17.0000 g | PACK | Freq: Every day | ORAL | Status: DC | PRN

## 2023-06-04 MED ORDER — KETAMINE HCL 50 MG/5ML IJ SOSY
PREFILLED_SYRINGE | INTRAMUSCULAR | Status: AC
  Administered 2023-06-04: 68 mg via INTRAVENOUS
  Filled 2023-06-04: qty 10

## 2023-06-04 MED ORDER — CLEVIDIPINE BUTYRATE 0.5 MG/ML IV EMUL
0.0000 mg/h | INTRAVENOUS | Status: DC
  Administered 2023-06-04: 2 mg/h via INTRAVENOUS
  Filled 2023-06-04: qty 100

## 2023-06-04 MED ORDER — LACTATED RINGERS IV BOLUS (SEPSIS)
250.0000 mL | Freq: Once | INTRAVENOUS | Status: AC
  Administered 2023-06-04: 250 mL via INTRAVENOUS

## 2023-06-04 MED ORDER — FENTANYL CITRATE PF 50 MCG/ML IJ SOSY
PREFILLED_SYRINGE | INTRAMUSCULAR | Status: AC
  Administered 2023-06-04: 100 ug via INTRAVENOUS
  Filled 2023-06-04: qty 2

## 2023-06-04 MED ORDER — PANCRELIPASE (LIP-PROT-AMYL) 12000-38000 UNITS PO CPEP
12000.0000 [IU] | ORAL_CAPSULE | Freq: Once | ORAL | Status: DC
  Filled 2023-06-04: qty 1

## 2023-06-04 MED ORDER — INSULIN ASPART 100 UNIT/ML IJ SOLN
1.0000 [IU] | INTRAMUSCULAR | Status: DC
  Administered 2023-06-04: 3 [IU] via SUBCUTANEOUS
  Administered 2023-06-04 (×3): 2 [IU] via SUBCUTANEOUS
  Administered 2023-06-05 (×2): 3 [IU] via SUBCUTANEOUS

## 2023-06-04 MED ORDER — ORAL CARE MOUTH RINSE: 15.0000 mL | OROMUCOSAL | Status: DC | PRN

## 2023-06-04 MED ORDER — LACTATED RINGERS IV SOLN
INTRAVENOUS | Status: AC
  Administered 2023-06-04: 1000 mL via INTRAVENOUS

## 2023-06-04 MED ORDER — CHLORHEXIDINE GLUCONATE CLOTH 2 % EX PADS: 6.0000 | MEDICATED_PAD | Freq: Every day | CUTANEOUS | Status: DC

## 2023-06-04 MED ORDER — MIDAZOLAM HCL 2 MG/2ML IJ SOLN
INTRAMUSCULAR | Status: AC
  Administered 2023-06-04: 2 mg via INTRAVENOUS
  Filled 2023-06-04: qty 2

## 2023-06-04 MED ORDER — MIDAZOLAM HCL 2 MG/2ML IJ SOLN: 2.0000 mg | Freq: Once | INTRAMUSCULAR | Status: AC

## 2023-06-04 MED ORDER — SUCCINYLCHOLINE CHLORIDE 200 MG/10ML IV SOSY
PREFILLED_SYRINGE | INTRAVENOUS | Status: AC
  Filled 2023-06-04: qty 10

## 2023-06-04 MED ORDER — SODIUM CHLORIDE 0.9 % IV SOLN
2.0000 g | INTRAVENOUS | Status: AC
  Administered 2023-06-04 – 2023-06-10 (×7): 2 g via INTRAVENOUS
  Filled 2023-06-04 (×7): qty 20

## 2023-06-04 MED ORDER — SODIUM CHLORIDE 0.9 % IV SOLN
100.0000 mg | Freq: Two times a day (BID) | INTRAVENOUS | Status: DC
  Administered 2023-06-04 (×2): 100 mg via INTRAVENOUS
  Filled 2023-06-04 (×3): qty 100

## 2023-06-04 MED ORDER — KETAMINE HCL 50 MG/5ML IJ SOSY: 1.0000 mg/kg | PREFILLED_SYRINGE | Freq: Once | INTRAMUSCULAR | Status: AC

## 2023-06-04 MED ORDER — VITAL 1.5 CAL PO LIQD
175.0000 mL | ORAL | Status: DC
  Filled 2023-06-04 (×3): qty 237

## 2023-06-04 MED ORDER — VANCOMYCIN HCL 1250 MG/250ML IV SOLN
1250.0000 mg | Freq: Once | INTRAVENOUS | Status: AC
  Administered 2023-06-04: 1250 mg via INTRAVENOUS
  Filled 2023-06-04: qty 250

## 2023-06-04 MED ORDER — VITAL HIGH PROTEIN PO LIQD: 1000.0000 mL | ORAL | Status: DC

## 2023-06-04 MED ORDER — FAMOTIDINE 20 MG PO TABS: 20.0000 mg | ORAL_TABLET | Freq: Every day | ORAL | Status: DC

## 2023-06-04 MED ORDER — PANCRELIPASE (LIP-PROT-AMYL) 12000-38000 UNITS PO CPEP
12000.0000 [IU] | ORAL_CAPSULE | Freq: Four times a day (QID) | ORAL | Status: DC
  Filled 2023-06-04 (×2): qty 1

## 2023-06-04 MED ORDER — LACTATED RINGERS IV BOLUS: 1000.0000 mL | Freq: Once | INTRAVENOUS | Status: DC

## 2023-06-04 MED ORDER — VANCOMYCIN HCL IN DEXTROSE 1-5 GM/200ML-% IV SOLN: 1000.0000 mg | Freq: Once | INTRAVENOUS | Status: DC

## 2023-06-04 MED ORDER — MUPIROCIN 2 % EX OINT
1.0000 | TOPICAL_OINTMENT | Freq: Two times a day (BID) | CUTANEOUS | Status: AC
  Administered 2023-06-04 – 2023-06-09 (×10): 1 via NASAL
  Filled 2023-06-04: qty 22

## 2023-06-04 MED ORDER — DOCUSATE SODIUM 50 MG/5ML PO LIQD: 100.0000 mg | Freq: Two times a day (BID) | ORAL | Status: DC | PRN

## 2023-06-04 MED ORDER — DOCUSATE SODIUM 50 MG/5ML PO LIQD
100.0000 mg | Freq: Two times a day (BID) | ORAL | Status: DC
  Administered 2023-06-04 – 2023-06-08 (×5): 100 mg
  Filled 2023-06-04 (×5): qty 10

## 2023-06-04 MED ORDER — ONDANSETRON HCL 4 MG/2ML IJ SOLN
4.0000 mg | Freq: Four times a day (QID) | INTRAMUSCULAR | Status: DC | PRN
  Administered 2023-06-24 – 2023-06-30 (×4): 4 mg via INTRAVENOUS
  Filled 2023-06-04 (×4): qty 2

## 2023-06-04 MED ORDER — FENTANYL BOLUS VIA INFUSION
50.0000 ug | INTRAVENOUS | Status: DC | PRN
  Administered 2023-06-04 (×2): 100 ug via INTRAVENOUS
  Administered 2023-06-04 (×3): 50 ug via INTRAVENOUS
  Administered 2023-06-04 – 2023-06-09 (×15): 100 ug via INTRAVENOUS
  Administered 2023-06-10: 50 ug via INTRAVENOUS
  Administered 2023-06-10: 100 ug via INTRAVENOUS
  Administered 2023-06-10: 50 ug via INTRAVENOUS

## 2023-06-04 MED ORDER — LACTATED RINGERS IV BOLUS (SEPSIS)
1000.0000 mL | Freq: Once | INTRAVENOUS | Status: AC
  Administered 2023-06-04: 1000 mL via INTRAVENOUS

## 2023-06-04 MED ORDER — PANCRELIPASE (LIP-PROT-AMYL) 12000-38000 UNITS PO CPEP
12000.0000 [IU] | ORAL_CAPSULE | ORAL | Status: DC
  Administered 2023-06-04 – 2023-06-06 (×11): 12000 [IU] via ORAL
  Filled 2023-06-04 (×12): qty 1

## 2023-06-04 MED ORDER — NOREPINEPHRINE 4 MG/250ML-% IV SOLN
2.0000 ug/min | INTRAVENOUS | Status: DC
  Administered 2023-06-04: 2 ug/min via INTRAVENOUS
  Filled 2023-06-04: qty 250

## 2023-06-04 MED ORDER — FENTANYL CITRATE PF 50 MCG/ML IJ SOSY
50.0000 ug | PREFILLED_SYRINGE | Freq: Once | INTRAMUSCULAR | Status: AC
  Administered 2023-06-04: 50 ug via INTRAVENOUS

## 2023-06-04 MED ORDER — ASPIRIN 81 MG PO CHEW
81.0000 mg | CHEWABLE_TABLET | Freq: Every day | ORAL | Status: DC
  Administered 2023-06-04 – 2023-06-16 (×13): 81 mg
  Filled 2023-06-04 (×13): qty 1

## 2023-06-04 MED ORDER — PANCRELIPASE (LIP-PROT-AMYL) 12000-38000 UNITS PO CPEP
12000.0000 [IU] | ORAL_CAPSULE | Freq: Every day | ORAL | Status: DC
  Filled 2023-06-04 (×3): qty 1

## 2023-06-04 MED ORDER — SODIUM CHLORIDE 0.9 % IV SOLN
250.0000 mL | INTRAVENOUS | Status: DC
  Administered 2023-06-04 – 2023-06-09 (×2): 250 mL via INTRAVENOUS

## 2023-06-04 MED ORDER — PROSOURCE TF20 ENFIT COMPATIBL EN LIQD
60.0000 mL | Freq: Every day | ENTERAL | Status: DC
  Administered 2023-06-04 – 2023-06-17 (×14): 60 mL
  Filled 2023-06-04 (×14): qty 60

## 2023-06-04 MED ORDER — HEPARIN SODIUM (PORCINE) 5000 UNIT/ML IJ SOLN
5000.0000 [IU] | Freq: Three times a day (TID) | INTRAMUSCULAR | Status: DC
  Administered 2023-06-04 – 2023-06-11 (×21): 5000 [IU] via SUBCUTANEOUS
  Filled 2023-06-04 (×19): qty 1

## 2023-06-04 MED ORDER — FENTANYL CITRATE PF 50 MCG/ML IJ SOSY: 100.0000 ug | PREFILLED_SYRINGE | Freq: Once | INTRAMUSCULAR | Status: AC

## 2023-06-04 MED ORDER — ORAL CARE MOUTH RINSE
15.0000 mL | OROMUCOSAL | Status: DC
  Administered 2023-06-04 – 2023-06-11 (×81): 15 mL via OROMUCOSAL

## 2023-06-04 MED ORDER — LEVETIRACETAM 100 MG/ML PO SOLN
500.0000 mg | Freq: Two times a day (BID) | ORAL | Status: DC
  Administered 2023-06-04 – 2023-06-13 (×20): 500 mg
  Filled 2023-06-04 (×23): qty 5

## 2023-06-04 MED ORDER — CLOPIDOGREL BISULFATE 75 MG PO TABS
75.0000 mg | ORAL_TABLET | Freq: Every day | ORAL | Status: DC
  Administered 2023-06-04 – 2023-06-16 (×13): 75 mg
  Filled 2023-06-04 (×13): qty 1

## 2023-06-04 MED ORDER — ATORVASTATIN CALCIUM 40 MG PO TABS
40.0000 mg | ORAL_TABLET | Freq: Every day | ORAL | Status: DC
  Administered 2023-06-04 – 2023-06-16 (×13): 40 mg
  Filled 2023-06-04 (×13): qty 1

## 2023-06-04 MED ORDER — VITAL 1.5 CAL PO LIQD: 1000.0000 mL | ORAL | Status: DC

## 2023-06-04 MED ORDER — ROCURONIUM BROMIDE 50 MG/5ML IV SOLN
70.0000 mg | Freq: Once | INTRAVENOUS | Status: AC
  Filled 2023-06-04: qty 7

## 2023-06-04 MED ORDER — METHYLPREDNISOLONE SODIUM SUCC 40 MG IJ SOLR
40.0000 mg | INTRAMUSCULAR | Status: DC
  Administered 2023-06-04 – 2023-06-08 (×4): 40 mg via INTRAVENOUS
  Filled 2023-06-04 (×4): qty 1

## 2023-06-04 MED ORDER — PANTOPRAZOLE SODIUM 40 MG IV SOLR
40.0000 mg | INTRAVENOUS | Status: DC
  Administered 2023-06-04 – 2023-06-09 (×6): 40 mg via INTRAVENOUS
  Filled 2023-06-04 (×6): qty 10

## 2023-06-04 MED ORDER — SODIUM CHLORIDE 0.9 % IV SOLN
2.0000 g | Freq: Once | INTRAVENOUS | Status: AC
  Administered 2023-06-04: 2 g via INTRAVENOUS
  Filled 2023-06-04: qty 12.5

## 2023-06-04 NOTE — ED Provider Notes (Signed)
  Physical Exam  BP 97/76   Pulse (!) 113   Temp (!) 100.8 F (38.2 C) (Axillary)   Resp (!) 21   Ht 1.6 m (5\' 3" )   Wt 68 kg   SpO2 (!) 86%   BMI 26.57 kg/m   Physical Exam  Procedures  Procedures  ED Course / MDM    Medical Decision Making Amount and/or Complexity of Data Reviewed Labs: ordered. Radiology: ordered.  Risk Prescription drug management.   77 y.o female ho copd, presents with hypoxic respiratory failure. CAP abx ordered Lactic acid  pending. Patient with diffuse right sided pneumonia. Pre hospital - Mag, duoneb, solumedrol Patient assessed at bedside- sats 86% on nrb with increased wob Patient refuses bipap States she does want intubation if needed She was not sure about cpr States she has two sons , but lives alone.        Margarita Grizzle, MD 06/09/23 1058

## 2023-06-04 NOTE — Procedures (Signed)
Intubation Procedure Note  [name]  [id number]  [birth year]  Date:06/04/23  Time:8:26 AM   Provider Performing:Stassi Fadely    Procedure: Intubation (31500)  Indication(s) Respiratory Failure  Consent Risks of the procedure as well as the alternatives and risks of each were explained to the patient and/or caregiver.  Consent for the procedure was obtained and is signed in the bedside chart Son [name] called and he agreeded with need for intubation.  Anesthesia Etomidate, Versed, Fentanyl, Rocuronium, and ketamine   Time Out Verified patient identification, verified procedure, site/side was marked, verified correct patient position, special equipment/implants available, medications/allergies/relevant history reviewed, required imaging and test results available.   Sterile Technique Usual hand hygeine, masks, and gloves were used   Procedure Description Patient positioned in bed supine.  Sedation given as noted above.  Patient was intubated with endotracheal tube using Glidescope.  View was Grade 1 full glottis .  Number of attempts was 1.  Colorimetric CO2 detector was consistent with tracheal placement.   Complications/Tolerance None; patient tolerated the procedure well. Chest X-ray is ordered to verify placement.   EBL none   Specimen(s) Tracheal aspirate   Brett Canales [name] ACNP Acute Care Nurse Practitioner [facility] Please consult Amion 06/04/2023, 8:28 AM

## 2023-06-04 NOTE — Progress Notes (Signed)
Pt transported on vent to CT. Pt desat to 85% while on 100% and 10 of peep, RN at bedside, RT at bedside. CCM MD notified, pt repositioned to laying on  left side SpO2 increased to 92%. Pt remained on 100% FiO2 and 10 of peep. RT will monitor as needed.

## 2023-06-04 NOTE — Progress Notes (Signed)
RT attempted ABG x 2 without success due to pt unable to remain still and pt  aggravation, MD notified, MD stated she will attempt, RN aware, RT will monitor.

## 2023-06-04 NOTE — Progress Notes (Signed)
ED Pharmacy Antibiotic Sign Off An antibiotic consult was received from an ED provider for vancomycin and cefepime per pharmacy dosing for sepsis. A chart review was completed to assess appropriateness.   The following one time order(s) were placed:  Vancomycin 1250mg  IV x1 Cefepime 2g IV x1  Further antibiotic and/or antibiotic pharmacy consults should be ordered by the admitting provider if indicated.   Thank you for allowing pharmacy to be a part of this patient's care.   Vernard Gambles, PharmD, BCPS  Clinical Pharmacist 06/04/23 6:32 AM

## 2023-06-04 NOTE — Progress Notes (Signed)
Initial Nutrition Assessment  DOCUMENTATION CODES:   Not applicable  INTERVENTION:   Initiate intermittent tube feeds via OG tube using pump: - Infuse 175 ml of Vital 1.5 formula @ 175 ml/hr x 1 hour every 4 hours via pump (total of 1050 ml daily) - Flush tube with 30 ml of water after each feed to maintain tube patency - PROSource TF20 60 ml daily  Intermittent tube feeding regimen provides 1655 kcal, 91 grams of protein, and 802 ml of H2O.  - Please obtain measured bed weight  NUTRITION DIAGNOSIS:   Inadequate oral intake related to inability to eat as evidenced by NPO status.  GOAL:   Patient will meet greater than or equal to 90% of their needs  MONITOR:   Vent status, Labs, Weight trends, TF tolerance, I & O's  REASON FOR ASSESSMENT:   Ventilator, Consult Enteral/tube feeding initiation and management  ASSESSMENT:   77 year old female who presented to the ED on 8/21 with respiratory distress. PMH of recurrent pneumonias, vitamin B12 deficiency, stroke, seizures, HTN, GERD, DM, DVT, chronic pancreatitis, CAD, colostomy. Pt admitted with pneumonia, sepsis.  08/21 - intubated  Discussed pt with RN and during ICU rounds. Consult received for enteral nutrition initiation and management. Pt with 14 French NG tube in proximal stomach per abdominal x-ray. Discussed pt with Dr. Chestine Spore. Pt requires pancreatic enzymes for chronic pancreatitis. Plan is to provide intermittent feeds via pump that align with timing of pancreatic enzyme administration per tube. Discussed with Pharmacy and RN. Assisted RN in programming pump correctly to reflect orders.  Unable to obtain diet and weight history at this time. Reviewed weight history in chart. Weight on admission of 150 lbs (68 kg) appears to be stated rather than measured. If weight on admission is accurate, pt has experienced an 11.5 kg weight loss since 04/19/23. This is a 14.5% weight loss in less than 2 months which is severe and  significant for timeframe. Question accuracy of stated weight on admission. Recommend obtaining measured bed weight when able.  Pt with some mild muscle depletions. Pt is at high risk for developing malnutrition and may already meet criteria for malnutrition if stated weight is accurate. Will have RN obtain bed weight to assess trends more accurately.  Patient is currently intubated on ventilator support MV: 8.6 L/min Temp (24hrs), Avg:99.6 F (37.6 C), Min:98.3 F (36.8 C), Max:100.8 F (38.2 C)  Drips: Fentanyl Cleviprex: 4 ml/hr (provides 192 kcal daily from lipid) LR: 150 ml/hr  Medications reviewed and include: colace, SSI every 4 hours, Creon 12,000 units every 4 hours, IV solu-medrol, IV protonix, miralax, IV abx  Labs reviewed: BUN 25, creatinine 2.17, lactic acid 2.7 CBG's: 109-173 x 24 hours  I/O's: +4.4 L since admit  NUTRITION - FOCUSED PHYSICAL EXAM:  Flowsheet Row Most Recent Value  Orbital Region No depletion  Upper Arm Region No depletion  Thoracic and Lumbar Region No depletion  Buccal Region Unable to assess  Temple Region No depletion  Clavicle Bone Region Mild depletion  Clavicle and Acromion Bone Region Mild depletion  Scapular Bone Region No depletion  Dorsal Hand No depletion  Patellar Region No depletion  Anterior Thigh Region Mild depletion  Posterior Calf Region Mild depletion  Edema (RD Assessment) None  Hair Reviewed  Eyes Reviewed  Mouth Reviewed  Skin Reviewed  Nails Reviewed    Diet Order:   Diet Order             Diet NPO time specified  Diet  effective now                   EDUCATION NEEDS:   Not appropriate for education at this time  Skin:  Skin Assessment: Reviewed RN Assessment  Last BM:  colostomy in place with no documented output  Height:   Ht Readings from Last 1 Encounters:  06/04/23 5\' 3"  (1.6 m)    Weight:   Wt Readings from Last 1 Encounters:  06/04/23 68 kg    Ideal Body Weight:  52.3  kg  BMI:  Body mass index is 26.57 kg/m.  Estimated Nutritional Needs:   Kcal:  1650-1850  Protein:  85-100 grams  Fluid:  1.6-1.8 L    Mertie Clause, MS, RD, LDN Registered Dietitian II Please see AMiON for contact information.

## 2023-06-04 NOTE — ED Triage Notes (Signed)
Pt arrives to ED c/o respiratory distress waking her from sleep. Pt reported to have a saturation of 70's on RA . Pt w/ recent pneumonia hx. Pt with rales per EMS. Pt given 2 duo nebs, 125mg  of sol medrol, 2g of mag per EMS. Pt reports improvement post meds. Pt with hx of COPD,asthma

## 2023-06-04 NOTE — Plan of Care (Signed)
°  Problem: Coping: °Goal: Level of anxiety will decrease °Outcome: Progressing °  °

## 2023-06-04 NOTE — TOC CM/SW Note (Signed)
Transition of Care [facility]) - Inpatient Brief Assessment   Patient Details  Name: [name] MRN: [id number] Date of Birth: March 30, 1946  Transition of [facility] CM/SW Contact:    Tom-Johnson, Hershal Coria, RN Phone Number: 06/04/2023, 3:50 PM   Clinical Narrative:  Patient presented to the ED with SOB and Cough. Currently intubated and sedated. On IV abx.   No TOC needs or recommendations noted at this time.  Patient not Medically ready for discharge.  CM will continue to follow as patient progresses with care towards discharge.    Transition of Care Asessment: Insurance and Status: Insurance coverage has been reviewed Patient has primary care physician: Yes Home environment has been reviewed: Yes Prior level of function:: Independent Prior/Current Home Services: No current home services Social Determinants of Health Reivew: SDOH reviewed no interventions necessary Readmission risk has been reviewed: Yes Transition of care needs: transition of care needs identified, TOC will continue to follow

## 2023-06-04 NOTE — ED Notes (Signed)
RT in room to place pt on Bipap °

## 2023-06-04 NOTE — Sepsis Progress Note (Signed)
Following for sepsis monitoring ?

## 2023-06-04 NOTE — H&P (Signed)
NAME:  [name], MRN:  [id number], DOB:  1946/05/27, LOS: 0 ADMISSION DATE:  06/04/2023, CONSULTATION DATE:  06/04/23 REFERRING MD:  Ray-ED, CHIEF COMPLAINT:  hypoxia   History of Present Illness:  [name] is a 77 year old woman with a history of recurrent pneumonias,, vitamin 20 B12 deficiency, stroke, seizures, hypertension, GERD, diabetes, DVT who presented to the hospital with shortness of breath and cough.  She has been admitted 4 times previously this year.  She woke up from sleep and respiratory distress.  With EMS she had oxygen saturations in the 70s on room air and despite nonrebreather only rose to mid 80s.  EMS administered DuoNebs, Solu-Medrol, magnesium.  She was placed on BiPAP in the emergency department.  She was administered vancomycin and cefepime after 1 blood culture was collected.  She failed to significantly improve on BiPAP.  She is not on inhalers at home.  Pertinent  Medical History  Hypertension Diabetes Seizures GERD B12 deficiency Stroke Sleep apnea Chronic pancreatitis CAD Colostomy  Significant Hospital Events: Including procedures, antibiotic start and stop dates in addition to other pertinent events   8/21 admitted, intubated  Interim History / Subjective:    Objective   Blood pressure 116/84, pulse (!) 140, temperature (!) 100.8 F (38.2 C), temperature source Axillary, resp. rate (!) 24, height 5\' 3"  (1.6 m), weight 68 kg, SpO2 99%.    Vent Mode: PRVC FiO2 (%):  [100 %] 100 % Set Rate:  [24 bmp-28 bmp] 28 bmp Vt Set:  [310 mL-400 mL] 310 mL PEEP:  [10 cmH20] 10 cmH20 Plateau Pressure:  [20 cmH20] 20 cmH20   Intake/Output Summary (Last 24 hours) at 06/04/2023 0865 Last data filed at 06/04/2023 7846 Gross per 24 hour  Intake 97.84 ml  Output --  Net 97.84 ml   Filed Weights   06/04/23 9629  Weight: 68 kg    Examination: General: Ill-appearing woman sitting up in bed no acute distress HENT: [place]/AT, eyes anicteric Lungs: Rales  on the right, tachypnea, breathing comfortably on BiPAP, saturating in high 80s on 90% FiO2 Cardiovascular: S1-S2, regular rate and rhythm Abdomen: Soft, nontender Extremities: No peripheral edema, no cyanosis Neuro: Awake, alert, answering questions appropriately, moving all extremities Derm: No diffuse rashes  CXR personally reviewed-significant infiltrate throughout the right lung, most significant in the right lower lobe.  LA 4 BUN 25 Cr 2.17  Resolved Hospital Problem list     Assessment & Plan:  Acute respiratory failure with hypoxia requiring mechanical ventilation, right lower lobe and right middle lobe pneumonia - Had a discussion with her regarding the expectation for timeline of improvement, BiPAP is not ideal for patients that are not to respond quickly.  She wanted aggressive care and therefore intubation was indicated.  We discussed this with her and her son [name] and she agreed to proceed. - 6 cc/kg ideal body weight with goal plateau less than 30 and driving pressure less than 15. - Collect trach aspirate - Check for COVID and respiratory viral panel - Empiric Antibiotics-ceftriaxone and doxycycline -Daily steroids - Legionella and pneumococcal antigens - PAD protocol for sedation - VAP prevention protocol - Daily SAT and SBT once appropriate.  Severe sepsis due to community-acquired pneumonia, lactic acidosis - Trend lactate - Maintain adequate perfusion, goal MAP greater than 65 - Empiric antibiotics  CKD 4 -Strict I's/O - Renally dose meds and avoid nephrotoxic meds - Maintain adequate perfusion  Hyperglycemia - Sliding scale insulin as needed - Goal blood glucose 140-180  Anemia, chronic -Transfuse for hemoglobin less than 7 or hemodynamically significant bleeding.  History of coronary artery disease, LHC with coronary atherectomy completed in 02/2023. -con't PTA DAPT (needs until May 2025) & atorvastatin -Hold PTA metoprolol due to risk of  hypotension  Hypertension - Hold PTA amlodipine, metoprolol, clonidine  GERD - PPI  History of pancreatic insufficiency due to chronic pancreatitis - Replacement enzymes; need to determine the optimal timing of this with tube feed administration.  Will discuss with RD.  Will start with 4 times daily dosing.  Best Practice (right click and "Reselect all SmartList Selections" daily)   Diet/type: tubefeeds DVT prophylaxis: prophylactic heparin  GI prophylaxis: PPI Lines: N/A Foley:  N/A Code Status:  full code Last date of multidisciplinary goals of care discussion [son updated 8/21 in the emergency department via phone]  Labs   CBC: Recent Labs  Lab 06/04/23 0625  WBC 10.4  NEUTROABS 8.5*  HGB 11.9*  HCT 39.4  MCV 94.0  PLT 260    Basic Metabolic Panel: Recent Labs  Lab 06/04/23 0625  NA 136  K 3.7  CL 104  CO2 17*  GLUCOSE 116*  BUN 25*  CREATININE 2.17*  CALCIUM 8.0*   GFR: Estimated Creatinine Clearance: 20.4 mL/min (A) (by C-G formula based on SCr of 2.17 mg/dL (H)). Recent Labs  Lab 06/04/23 0625 06/04/23 0643  WBC 10.4  --   LATICACIDVEN  --  4.0*    Liver Function Tests: No results for input(s): "AST", "ALT", "ALKPHOS", "BILITOT", "PROT", "ALBUMIN" in the last 168 hours. No results for input(s): "LIPASE", "AMYLASE" in the last 168 hours. No results for input(s): "AMMONIA" in the last 168 hours.  ABG    Component Value Date/Time   PHART 7.285 (L) 10/28/2017 2028   PCO2ART 39.6 10/28/2017 2028   PO2ART 503 (H) 10/28/2017 2028   HCO3 18.2 (L) 10/28/2017 2028   TCO2 17 (L) 02/12/2023 1223   ACIDBASEDEF 7.4 (H) 10/28/2017 2028   O2SAT 99.2 10/28/2017 2028     Coagulation Profile: No results for input(s): "INR", "PROTIME" in the last 168 hours.  Cardiac Enzymes: No results for input(s): "CKTOTAL", "CKMB", "CKMBINDEX", "TROPONINI" in the last 168 hours.  HbA1C: Hgb A1c MFr Bld  Date/Time Value Ref Range Status  01/02/2023 06:23 PM 6.5  (H) 4.8 - 5.6 % Final    Comment:    (NOTE)         Prediabetes: 5.7 - 6.4         Diabetes: >6.4         Glycemic control for adults with diabetes: <7.0   04/16/2022 08:18 AM 6.2 (H) 4.8 - 5.6 % Final    Comment:    (NOTE) Pre diabetes:          5.7%-6.4%  Diabetes:              >6.4%  Glycemic control for   <7.0% adults with diabetes     CBG: Recent Labs  Lab 06/04/23 0907  GLUCAP 109*    Review of Systems:   Limited due to respiratory status, patient on bipap.  Past Medical History:  She,  has a past medical history of Anxiety, Arthritis, Blind loop syndrome, Clotting disorder (HCC), Colostomy present (HCC), Coronary artery disease, Depression, Diabetes insipidus (HCC), Diabetes mellitus, Diverticulosis, Dizziness, Dyslipidemia, Fatty liver, GERD (gastroesophageal reflux disease), Heart murmur, Hiatal hernia, Hypertension, Irritable bowel syndrome, Pancreatitis, chronic (HCC), Peripheral vascular disease (HCC) (02/20/2010), Seizure (HCC) (11/04/2018), Sleep apnea, Stroke [facility]), Thyroid nodule,  and Vitamin B12 deficiency.   Surgical History:   Past Surgical History:  Procedure Laterality Date   ABDOMINAL HYSTERECTOMY     partial   AUGMENTATION MAMMAPLASTY     bilateral 1976 retro    BREAST ENHANCEMENT SURGERY     CARDIAC CATHETERIZATION  07/07/98;08/31/01;03/04/05   60 -70% LAD   CATARACT EXTRACTION Bilateral    COLON RESECTION N/A 10/14/2017   Procedure: EXPLORATORY LAPAROTOMY, EXTENDED RIGHT COLECTOMY; APPLICATION OF ABDOMINAL VACUUM DRESSING;  Surgeon: Griselda Miner, MD;  Location: [facility] ORS;  Service: General;  Laterality: N/A;   COLONOSCOPY     CORONARY ATHERECTOMY N/A 02/12/2023   Procedure: CORONARY ATHERECTOMY;  Surgeon: Swaziland, Peter M, MD;  Location: [facility] INVASIVE CV LAB;  Service: Cardiovascular;  Laterality: N/A;   CORONARY STENT INTERVENTION N/A 02/12/2023   Procedure: CORONARY STENT INTERVENTION;  Surgeon: [place]il[place], Peter M, MD;  [facility] INVASIVE CV LAB;   Service: Cardiovascular;  Laterality: N/A;   CORONARY ULTRASOUND/IVUS N/A 02/12/2023   Procedure: Coronary Ultrasound/IVUS;  Surgeon: Swaziland, Peter M, MD;  Location: [facility] INVASIVE CV LAB;  Service: Cardiovascular;  Laterality: N/A;   ENDOBRONCHIAL ULTRASOUND Bilateral 02/24/2017   Procedure: ENDOBRONCHIAL ULTRASOUND;  Surgeon: Leslye Peer, MD;  Location: [facility] ENDOSCOPY;  Service: Cardiopulmonary;  Laterality: Bilateral;   FEMORAL ARTERY STENT     Left leg   LAPAROTOMY N/A 10/16/2017   Procedure: EXPLORATORY LAPAROTOMY, PARTIAL OMENTECTOMY, RESECTION ISCHEMIC ILEUM 130CM, APPLICATION OF VAC ABDOMINAL DRESSING;  Surgeon: Darnell Level, MD;  Location: [facility] ORS;  Service: General;  Laterality: N/A;   LAPAROTOMY N/A 10/18/2017   Procedure: RE-EXPLORATION OF ABDOMEN, ILEOSTOMY CREATION;  Surgeon: Almond Lint, MD;  Location: [facility] ORS;  Service: General;  Laterality: N/A;   LEFT HEART CATH AND CORONARY ANGIOGRAPHY N/A 01/29/2023   Procedure: LEFT HEART CATH AND CORONARY ANGIOGRAPHY;  Surgeon: Swaziland, Peter M, MD;  Location: [facility] INVASIVE CV LAB;  Service: Cardiovascular;  Laterality: N/A;   LUNG SURGERY  03/2017   Benign polyps removed   PATELLA REALIGNMENT Left    POLYPECTOMY     SIGMOIDOSCOPY     TONSILLECTOMY     TRANSCAROTID ARTERY REVASCULARIZATION  Right 04/18/2022   Procedure: Transcarotid Artery Revascularization;  Surgeon: Victorino Sparrow, MD;  Location[facility] OR;  Service: Vascular;  Laterality: Right;     Social History:   reports that she quit smoking about 12 years ago. Her smoking use included cigarettes. She started smoking about 62 years ago. She has a 50 pack-year smoking history. She has never used smokeless tobacco. She reports that she does not drink alcohol and does not use drugs.   Family History:  Her family history includes Colon cancer in her paternal grandfather; Hypertension in her father and mother; Osteoarthritis in her mother; Pulmonary embolism in her mother; Stroke in her  father and another family member. There is no history of Breast cancer, Colon polyps, Esophageal cancer, Rectal cancer, Stomach cancer, Pancreatic cancer, or Liver disease.   Allergies Allergies  Allergen Reactions   Adhesive [Tape] Other (See Comments)    "Took off my skin"   Other Rash    "Took off my skin"   Ace Inhibitors Other (See Comments)    Unknown   Sulfa Antibiotics Other (See Comments)    Unknown   Topiramate     unknown   Codeine Other (See Comments)    Altered mental state    Iodinated Contrast Media Rash   Latex Rash     Home Medications  Prior  to Admission medications   Medication Sig Start Date End Date Taking? Authorizing Provider  acetaminophen (TYLENOL) 325 MG tablet Take 650 mg by mouth every 6 (six) hours as needed for mild pain.    [provider]  albuterol (VENTOLIN HFA) 108 (90 Base) MCG/ACT inhaler Inhale 1-2 puffs into the lungs every 6 (six) hours as needed for wheezing or shortness of breath. 03/30/21   [provider]  amLODipine (NORVASC) 10 MG tablet Take 1 tablet (10 mg total) by mouth daily. 04/20/23   Leroy Sea, MD  amoxicillin-clavulanate (AUGMENTIN) 500-125 MG tablet Take 1 tablet by mouth 2 (two) times daily between meals as needed. Patient not taking: Reported on 05/21/2023 04/20/23   Leroy Sea, MD  aspirin 81 MG chewable tablet Chew 1 tablet (81 mg total) by mouth daily. 04/19/22   Tyrone Nine, MD  atorvastatin (LIPITOR) 40 MG tablet Take 1 tablet (40 mg total) by mouth daily. 04/19/22   Tyrone Nine, MD  cloNIDine (CATAPRES) 0.1 MG tablet Take 1 tablet (0.1 mg total) by mouth 2 (two) times daily. 04/20/23   Leroy Sea, MD  clopidogrel (PLAVIX) 75 MG tablet Take 1 tablet (75 mg total) by mouth daily. Patient taking differently: Take 75 mg by mouth every evening. 04/19/22   Tyrone Nine, MD  [employer] DIGESTIVE PROBIOTIC 250 MG capsule Take 500 mg by mouth daily. 06/27/21   [provider]  esomeprazole  (NEXIUM) 40 MG capsule Take 40 mg by mouth 2 (two) times daily before a meal. 01/11/20   [provider]  fluticasone (FLONASE) 50 MCG/ACT nasal spray Place 2 sprays into both nostrils daily as needed for allergies. 01/29/16   [provider]  furosemide (LASIX) 20 MG tablet Take 1 tablet (20 mg total) by mouth daily as needed. Patient taking differently: Take 20 mg by mouth every other day. 02/13/23   Perlie Gold, PA-C  isosorbide mononitrate (IMDUR) 60 MG 24 hr tablet TAKE 1 TABLET BY MOUTH EVERY DAY 04/28/23   [place], Peter M, MD  levETIRAcetam (KEPPRA) 500 MG tablet TAKE 1 TABLET BY MOUTH TWICE A DAY Patient taking differently: Take 500 mg by mouth 2 (two) times daily. 01/20/20   Dohmeier, Porfirio Mylar, MD  meclizine (ANTIVERT) 25 MG tablet Take 0.5 tablets (12.5 mg total) by mouth 3 (three) times daily as needed for dizziness. 01/04/23   Leroy Sea, MD  metoprolol succinate (TOPROL-XL) 100 MG 24 hr tablet Take 100 mg by mouth daily. 12/20/21   [provider]  mirtazapine (REMERON) 15 MG tablet Take 15 mg by mouth at bedtime. 07/18/21   [provider]  montelukast (SINGULAIR) 10 MG tablet Take 10 mg by mouth daily. 08/09/18   [provider]  nitroGLYCERIN (NITROSTAT) 0.4 MG SL tablet Place 1 tablet (0.4 mg total) under the tongue every 5 (five) minutes as needed for chest pain. 01/23/23   [place], Peter M, MD  ondansetron (ZOFRAN) 4 MG tablet Take 1 tablet (4 mg total) by mouth every 6 (six) hours as needed for nausea or vomiting. 10/24/21   Esterwood, Amy S, PA-C  Pancrelipase, Lip-Prot-Amyl, (ZENPEP) 40000-126000 units CPEP Take 2 capsules with meals and one capsule with snacks. Patient taking differently: Take 2 capsules by mouth 2 (two) times daily. 06/08/21   Meryl Dare, MD  pantoprazole (PROTONIX) 40 MG tablet Take 1 tablet (40 mg total) by mouth 2 (two) times daily. 01/31/22   Meryl Dare, MD  Polysaccharide-Iron Complex 150  MG CAPS Take 1  tablet by mouth daily.    [provider]  venlafaxine XR (EFFEXOR-XR) 75 MG 24 hr capsule Take 1 capsule (75 mg total) by mouth daily. 05/18/20   [provider]  vitamin B-12 (CYANOCOBALAMIN) 500 MCG tablet Take 500 mcg by mouth daily.    [provider]  Vitamin D, Ergocalciferol, (DRISDOL) 1.25 MG (50000 UT) CAPS capsule Take 50,000 Units by mouth every Wednesday.     [provider]     Critical care time: 50 min.     Steffanie Dunn, DO 06/04/23 9:39 AM [facility]  For contact information, see Amion. If no response to pager, please call PCCM consult pager. After hours, 7PM- 7AM, please call Elink.

## 2023-06-04 NOTE — ED Provider Notes (Signed)
EMERGENCY DEPARTMENT AT [facility] Provider Note   CSN: [id number] Arrival date & time: 06/04/23  4098     History  Chief Complaint  Patient presents with   Respiratory Distress    [name] is a 77 y.o. female.  HPI     This is a 77 year old female who presents with shortness of breath, chest pain.  Woke up this morning acutely for breath.  History of COPD and asthma.  Per EMS, O2 sats were in the 70s.  She does not regularly wear oxygen.  She was recently admitted to the hospital for pneumonia in early July.  She was given 2 DuoNebs, 125 Solu-Medrol, magnesium.  Patient also endorses some chest discomfort.  Reports that it feels tight.  Has not had any fevers at home but felt warm this morning.  Level 5 caveat for acuity of condition  Home Medications Prior to Admission medications   Medication Sig Start Date End Date Taking? Authorizing Provider  acetaminophen (TYLENOL) 325 MG tablet Take 650 mg by mouth every 6 (six) hours as needed for mild pain.    [provider]  albuterol (VENTOLIN HFA) 108 (90 Base) MCG/ACT inhaler Inhale 1-2 puffs into the lungs every 6 (six) hours as needed for wheezing or shortness of breath. 03/30/21   [provider]  amLODipine (NORVASC) 10 MG tablet Take 1 tablet (10 mg total) by mouth daily. 04/20/23   Leroy Sea, MD  amoxicillin-clavulanate (AUGMENTIN) 500-125 MG tablet Take 1 tablet by mouth 2 (two) times daily between meals as needed. Patient not taking: Reported on 05/21/2023 04/20/23   Leroy Sea, MD  aspirin 81 MG chewable tablet Chew 1 tablet (81 mg total) by mouth daily. 04/19/22   Tyrone Nine, MD  atorvastatin (LIPITOR) 40 MG tablet Take 1 tablet (40 mg total) by mouth daily. 04/19/22   Tyrone Nine, MD  cloNIDine (CATAPRES) 0.1 MG tablet Take 1 tablet (0.1 mg total) by mouth 2 (two) times daily. 04/20/23   Leroy Sea, MD  clopidogrel (PLAVIX) 75 MG tablet Take 1 tablet (75 mg  total) by mouth daily. Patient taking differently: Take 75 mg by mouth every evening. 04/19/22   Tyrone Nine, MD  [employer] DIGESTIVE PROBIOTIC 250 MG capsule Take 500 mg by mouth daily. 06/27/21   [provider]  esomeprazole (NEXIUM) 40 MG capsule Take 40 mg by mouth 2 (two) times daily before a meal. 01/11/20   [provider]  fluticasone (FLONASE) 50 MCG/ACT nasal spray Place 2 sprays into both nostrils daily as needed for allergies. 01/29/16   [provider]  furosemide (LASIX) 20 MG tablet Take 1 tablet (20 mg total) by mouth daily as needed. Patient taking differently: Take 20 mg by mouth every other day. 02/13/23   Perlie Gold, PA-C  isosorbide mononitrate (IMDUR) 60 MG 24 hr tablet TAKE 1 TABLET BY MOUTH EVERY DAY 04/28/23   [place], Peter M, MD  levETIRAcetam (KEPPRA) 500 MG tablet TAKE 1 TABLET BY MOUTH TWICE A DAY Patient taking differently: Take 500 mg by mouth 2 (two) times daily. 01/20/20   Dohmeier, Porfirio Mylar, MD  meclizine (ANTIVERT) 25 MG tablet Take 0.5 tablets (12.5 mg total) by mouth 3 (three) times daily as needed for dizziness. 01/04/23   Leroy Sea, MD  metoprolol succinate (TOPROL-XL) 100 MG 24 hr tablet Take 100 mg by mouth daily. 12/20/21   [provider]  mirtazapine (REMERON) 15 MG tablet Take 15  mg by mouth at bedtime. 07/18/21   [provider]  montelukast (SINGULAIR) 10 MG tablet Take 10 mg by mouth daily. 08/09/18   [provider]  nitroGLYCERIN (NITROSTAT) 0.4 MG SL tablet Place 1 tablet (0.4 mg total) under the tongue every 5 (five) minutes as needed for chest pain. 01/23/23   [place], Peter M, MD  ondansetron (ZOFRAN) 4 MG tablet Take 1 tablet (4 mg total) by mouth every 6 (six) hours as needed for nausea or vomiting. 10/24/21   Esterwood, Amy S, PA-C  Pancrelipase, Lip-Prot-Amyl, (ZENPEP) 40000-126000 units CPEP Take 2 capsules with meals and one capsule with snacks. Patient taking differently: Take 2 capsules by mouth  2 (two) times daily. 06/08/21   Meryl Dare, MD  pantoprazole (PROTONIX) 40 MG tablet Take 1 tablet (40 mg total) by mouth 2 (two) times daily. 01/31/22   Meryl Dare, MD  Polysaccharide-Iron Complex 150 MG CAPS Take 1 tablet by mouth daily.    [provider]  venlafaxine XR (EFFEXOR-XR) 75 MG 24 hr capsule Take 1 capsule (75 mg total) by mouth daily. 05/18/20   [provider]  vitamin B-12 (CYANOCOBALAMIN) 500 MCG tablet Take 500 mcg by mouth daily.    [provider]  Vitamin D, Ergocalciferol, (DRISDOL) 1.25 MG (50000 UT) CAPS capsule Take 50,000 Units by mouth every Wednesday.     [provider]      Allergies    Adhesive [tape], Other, Ace inhibitors, Sulfa antibiotics, Topiramate, Codeine, Iodinated contrast media, and Latex    Review of Systems   Review of Systems  Constitutional:  Positive for chills and fever.  Respiratory:  Positive for shortness of breath.   Cardiovascular:  Positive for chest pain.  All other systems reviewed and are negative.   Physical Exam Updated Vital Signs BP 116/84   Pulse (!) 115   Temp (!) 100.8 F (38.2 C) (Axillary)   Resp 20   Ht 1.6 m (5\' 3" )   Wt 68 kg   SpO2 91%   BMI 26.57 kg/m  Physical Exam Vitals and nursing note reviewed.  Constitutional:      Appearance: She is well-developed. She is ill-appearing.  HENT:     Head: Normocephalic and atraumatic.  Eyes:     Pupils: Pupils are equal, round, and reactive to light.  Cardiovascular:     Rate and Rhythm: Normal rate and regular rhythm.     Heart sounds: Normal heart sounds.  Pulmonary:     Effort: Pulmonary effort is normal. No respiratory distress.     Breath sounds: Rhonchi present. No wheezing.     Comments: Rhonchorous with diminished breath sounds throughout the entire right lung, tripoding Abdominal:     Palpations: Abdomen is soft.  Musculoskeletal:     Cervical back: Neck supple.     Comments: Bilateral lower extremity  trace edema  Skin:    General: Skin is warm and dry.  Neurological:     Mental Status: She is alert and oriented to person, place, and time.  Psychiatric:        Mood and Affect: Mood normal.     ED Results / Procedures / Treatments   Labs (all labs ordered are listed, but only abnormal results are displayed) Labs Reviewed  CBC WITH DIFFERENTIAL/PLATELET - Abnormal; Notable for the following components:      Result Value   Hemoglobin 11.9 (*)    Neutro Abs 8.5 (*)    All other components within  normal limits  BASIC METABOLIC PANEL - Abnormal; Notable for the following components:   CO2 17 (*)    Glucose, Bld 116 (*)    BUN 25 (*)    Creatinine, Ser 2.17 (*)    Calcium 8.0 (*)    GFR, Estimated 23 (*)    All other components within normal limits  TROPONIN I (HIGH SENSITIVITY) - Abnormal; Notable for the following components:   Troponin I (High Sensitivity) 19 (*)    All other components within normal limits  RESP PANEL BY RT-PCR (RSV, FLU A&B, COVID)  RVPGX2  CULTURE, BLOOD (SINGLE)  CULTURE, BLOOD (SINGLE)  BRAIN NATRIURETIC PEPTIDE  LACTIC ACID, PLASMA  LACTIC ACID, PLASMA  I-STAT ARTERIAL BLOOD GAS, ED  TROPONIN I (HIGH SENSITIVITY)    EKG EKG Interpretation Date/Time:  Wednesday June 04 2023 06:20:23 EDT Ventricular Rate:  117 PR Interval:  128 QRS Duration:  100 QT Interval:  340 QTC Calculation: 475 R Axis:   39  Text Interpretation: Sinus tachycardia Repol abnrm, severe global ischemia (LM/MVD) SImilar to prior Confirmed by Ross Marcus ([id number]) on 06/04/2023 6:22:22 AM  Radiology DG Chest Portable 1 View  Result Date: 06/04/2023 CLINICAL DATA:  77 year old female with history of shortness of breath and chest pain. EXAM: PORTABLE CHEST 1 VIEW COMPARISON:  Chest x-ray 04/16/2023. FINDINGS: Worsening interstitial and airspace disease throughout the right lung, most confluent in the periphery of the right lung base. Blunting of the right costophrenic  sulcus. Left lung appears clear. No left pleural effusion. No pneumothorax. No evidence of pulmonary edema. Heart size appears borderline enlarged. The patient is rotated to the right on today's exam, resulting in distortion of the mediastinal contours and reduced diagnostic sensitivity and specificity for mediastinal pathology. Atherosclerotic calcifications in the thoracic aorta. IMPRESSION: 1. Worsening severe multilobar right-sided pneumonia with small right parapneumonic pleural effusion. 2. Mild cardiomegaly. 3. Aortic atherosclerosis. Electronically Signed   By: Trudie Reed M.D.   On: 06/04/2023 06:40    Procedures .Critical Care  Performed by: Shon Baton, MD Authorized by: Shon Baton, MD   Critical care provider statement:    Critical care time (minutes):  60   Critical care was necessary to treat or prevent imminent or life-threatening deterioration of the following conditions:  Respiratory failure and sepsis   Critical care was time spent personally by me on the following activities:  Development of treatment plan with patient or surrogate, discussions with consultants, evaluation of patient's response to treatment, examination of patient, ordering and review of laboratory studies, ordering and review of radiographic studies, ordering and performing treatments and interventions, pulse oximetry, re-evaluation of patient's condition and review of old charts     Medications Ordered in ED Medications  lactated ringers infusion (1,000 mLs Intravenous New Bag/Given 06/04/23 0648)  vancomycin (VANCOREADY) IVPB 1250 mg/250 mL (1,250 mg Intravenous New Bag/Given 06/04/23 0732)  lactated ringers bolus 1,000 mL (has no administration in time range)    And  lactated ringers bolus 1,000 mL (has no administration in time range)    And  lactated ringers bolus 250 mL (has no administration in time range)  doxycycline (VIBRAMYCIN) 100 mg in sodium chloride 0.9 % 250 mL IVPB (has no  administration in time range)  ceFEPIme (MAXIPIME) 2 g in sodium chloride 0.9 % 100 mL IVPB (0 g Intravenous Stopped 06/04/23 0729)    ED Course/ Medical Decision Making/ A&P  Medical Decision Making Amount and/or Complexity of Data Reviewed Labs: ordered. Radiology: ordered.  Risk Prescription drug management. Decision regarding hospitalization.   This patient presents to the ED for concern of shortness of breath, chest pain, this involves an extensive number of treatment options, and is a complaint that carries with it a high risk of complications and morbidity.  I considered the following differential and admission for this acute, potentially life threatening condition.  The differential diagnosis includes ACS, pneumothorax, pneumonia, COPD  MDM:    This is a 77 year old female who presents with acute worsening shortness of breath.  Recent history of pneumonia and was hospitalized in early July.  She is significantly hypoxic on 15 L upon arrival.  EMS reports improvement after administering a DuoNeb.  No significant wheezing on exam at this time but more rhonchorous.  Given borderline O2 sats on 15 L.  Recommended BiPAP.  Patient is unclear of her advanced directives at this time and is by default full code.  She initially hesitated with BiPAP but I discussed with her that this would potentially prevent intubation although her respiratory status is quite tenuous.  Sepsis workup was initiated given her fever of 100.8.  She was given 30 cc/kg fluid and antibiotics to cover for HCAP.  Critical care was consulted.  Chest x-ray significantly worsening right-sided multi lobar pneumonia.  (Labs, imaging, consults)  Labs: I Ordered, and personally interpreted labs.  The pertinent results include: CBC, CMP, lactic, blood cultures  Imaging Studies ordered: I ordered imaging studies including chest x-ray, CT chest ordered I independently visualized and interpreted  imaging. I agree with the radiologist interpretation  Additional history obtained from EMS.  External records from outside source obtained and reviewed including prior discharge summary  Cardiac Monitoring: The patient was maintained on a cardiac monitor.  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 known 60-70% mid-LAD stenosis with caths in 1999, 2002, 2006, and 2012 showing stable anatomy b. 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HCC)    Diabetes mellitus    Diverticulosis    Dizziness    chronic   Dyslipidemia    Fatty liver    GERD (gastroesophageal reflux disease)    hiatial hernia   Heart murmur    Hiatal hernia    Hypertension    Irritable bowel syndrome    Pancreatitis, chronic (HCC)    Peripheral vascular disease (HCC) 02/20/2010   s/p stent of left SFA; carotid stenosis   Seizure (HCC) 11/04/2018   last 1 yr 2020 per pt unsure month    Sleep apnea    no cpap    Stroke [facility])    Thyroid nodule    Vitamin B12 deficiency      Medicines Meds ordered this encounter  Medications   lactated ringers infusion   DISCONTD: vancomycin (VANCOCIN)  IVPB 1000 mg/200 mL premix    Order Specific Question:   Indication:    Answer:   Pneumonia   ceFEPIme (MAXIPIME) 2 g in sodium chloride 0.9 % 100 mL IVPB    Order Specific Question:   Antibiotic Indication:    Answer:   HCAP   vancomycin  (VANCOREADY) IVPB 1250 mg/250 mL    Order Specific Question:   Indication:    Answer:   Pneumonia   DISCONTD: lactated ringers bolus 1,000 mL   AND Linked Order Group    lactated ringers bolus 1,000 mL     Order Specific Question:   Total Body Weight basis for 30 mL/kg  bolus delivery     Answer:   68 kg    lactated ringers bolus 1,000 mL     Order Specific Question:   Total Body Weight basis for 30 mL/kg  bolus delivery     Answer:   68 kg    lactated ringers bolus 250 mL     Order Specific Question:   Total Body Weight basis for 30 mL/kg  bolus delivery     Answer:   68 kg   doxycycline (VIBRAMYCIN) 100 mg in sodium chloride 0.9 % 250 mL IVPB    Order Specific Question:   Antibiotic Indication:    Answer:   CAP    I have reviewed the patients home medicines and have made adjustments as needed  Problem List / ED Course: Problem List Items Addressed This Visit   None               Final Clinical Impression(s) / ED Diagnoses Final diagnoses:  None    Rx / DC Orders ED Discharge Orders     None         Shon Baton, MD 06/04/23 2308

## 2023-06-04 NOTE — Progress Notes (Signed)
ABG reviewed- acute respiratory and metabolic acidosis Pplat 20  Plan d/w RT: -Increase Vt to 7cc/kg, increase RR to 35 -Decrease FiO2 80%  D/w RN- she has the patient's jewelry that was removed in the ED. Will give to family when they arrive.  Sending urinary antigens soon.  Steffanie Dunn, DO 06/04/23 12:35 PM [facility]  For contact information, see Amion. If no response to pager, please call PCCM consult pager. After hours, 7PM- 7AM, please call Elink.

## 2023-06-04 NOTE — Progress Notes (Signed)
Patient's belongings are in a bag secured by patient labels. Two rings and two earrings are in this bag. Once family arrives we can send home with them.

## 2023-06-04 NOTE — Progress Notes (Signed)
Desaturation episode in CT from laying flat, back to 100%. Improved with turning to her left side.  Now hypotensive, off cleviprex. Starting peripheral NE.   CT personally reviewed> very dense RLL infiltrate, less dense infiltrate in RML. No significant effusion or left-sided disease.  Steffanie Dunn, DO 06/04/23 2:20 PM [facility]  For contact information, see Amion. If no response to pager, please call PCCM consult pager. After hours, 7PM- 7AM, please call Elink.

## 2023-06-04 NOTE — Progress Notes (Signed)
Patient was transported to [facility] without any complications.

## 2023-06-04 NOTE — Progress Notes (Signed)
 Resp culture collected and sent to lab. RT will monitor as needed.

## 2023-06-04 NOTE — ED Notes (Signed)
ED TO INPATIENT HANDOFF REPORT  ED Nurse Name and Phone #: Dorothyann Gibbs [phone]  S Name/Age/Gender [name] 77 y.o. female Room/Bed: 026C/026C  Code Status   Code Status: Full Code  Home/SNF/Other Home Patient oriented to: self, place, time, and situation Is this baseline? Yes   Triage Complete: Triage complete  Chief Complaint Acute respiratory failure with hypoxia (HCC) [J96.01]  Triage Note Pt arrives to ED c/o respiratory distress waking her from sleep. Pt reported to have a saturation of 70's on RA . Pt w/ recent pneumonia hx. Pt with rales per EMS. Pt given 2 duo nebs, 125mg  of sol medrol, 2g of mag per EMS. Pt reports improvement post meds. Pt with hx of COPD,asthma   Allergies Allergies  Allergen Reactions   Adhesive [Tape] Other (See Comments)    "Took off my skin"   Other Rash    "Took off my skin"   Ace Inhibitors Other (See Comments)    Unknown   Sulfa Antibiotics Other (See Comments)    Unknown   Topiramate     unknown   Codeine Other (See Comments)    Altered mental state    Iodinated Contrast Media Rash   Latex Rash    Level of Care/Admitting Diagnosis ED Disposition     ED Disposition  Admit   Condition  --   Comment  Hospital Area: [facility] [[id number]]  Level of Care: ICU [6]  May admit patient to [facility] or [facility] if equivalent level of care is available:: Yes  Covid Evaluation: Symptomatic Person Under Investigation (PUI) or recent exposure (last 10 days) *Testing Required*  Diagnosis: Acute respiratory failure with hypoxia [facility]) [[id number]]  Admitting Physician: Steffanie Dunn [[id number]]  Attending Physician: Steffanie Dunn [[id number]]  Certification:: I certify this patient will need inpatient services for at least 2 midnights  Expected Medical Readiness: 06/14/2023          B Medical/Surgery History Past Medical History:  Diagnosis Date   Anxiety    Arthritis    back    Blind loop syndrome     Clotting disorder (HCC)    Hx Clot in leg per pt    Colostomy present (HCC)    Coronary artery disease    a. known 60-70% mid-LAD stenosis with caths in 1999, 2002, 2006, and 2012 showing stable anatomy b. 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HCC)    Diabetes mellitus    Diverticulosis    Dizziness    chronic   Dyslipidemia    Fatty liver    GERD (gastroesophageal reflux disease)    hiatial hernia   Heart murmur    Hiatal hernia    Hypertension    Irritable bowel syndrome    Pancreatitis, chronic (HCC)    Peripheral vascular disease (HCC) 02/20/2010   s/p stent of left SFA; carotid stenosis   Seizure (HCC) 11/04/2018   last 1 yr 2020 per pt unsure month    Sleep apnea    no cpap    Stroke (HCC)    Thyroid nodule    Vitamin B12 deficiency    Past Surgical History:  Procedure Laterality Date   ABDOMINAL HYSTERECTOMY     partial   AUGMENTATION MAMMAPLASTY     bilateral 1976 retro    BREAST ENHANCEMENT SURGERY     CARDIAC CATHETERIZATION  07/07/98;08/31/01;03/04/05   60 -70% LAD   CATARACT EXTRACTION Bilateral    COLON  RESECTION N/A 10/14/2017   Procedure: EXPLORATORY LAPAROTOMY, EXTENDED RIGHT COLECTOMY; APPLICATION OF ABDOMINAL VACUUM DRESSING;  Surgeon: Griselda Miner, MD;  Location: [facility] ORS;  Service: General;  Laterality: N/A;   COLONOSCOPY     CORONARY ATHERECTOMY N/A 02/12/2023   Procedure: CORONARY ATHERECTOMY;  Surgeon: Swaziland, Peter M, MD;  Location: [facility] INVASIVE CV LAB;  Service: Cardiovascular;  Laterality: N/A;   CORONARY STENT INTERVENTION N/A 02/12/2023   Procedure: CORONARY STENT INTERVENTION;  Surgeon: Swaziland, Peter M, MD;  Location: [facility] INVASIVE CV LAB;  Service: Cardiovascular;  Laterality: N/A;   CORONARY ULTRASOUND/IVUS N/A 02/12/2023   Procedure: Coronary Ultrasound/IVUS;  Surgeon: Swaziland, Peter M, MD;  Location: [facility] INVASIVE CV LAB;  Service: Cardiovascular;  Laterality: N/A;   ENDOBRONCHIAL ULTRASOUND Bilateral 02/24/2017   Procedure:  ENDOBRONCHIAL ULTRASOUND;  Surgeon: Leslye Peer, MD;  Location: [facility] ENDOSCOPY;  Service: Cardiopulmonary;  Laterality: Bilateral;   FEMORAL ARTERY STENT     Left leg   LAPAROTOMY N/A 10/16/2017   Procedure: EXPLORATORY LAPAROTOMY, PARTIAL OMENTECTOMY, RESECTION ISCHEMIC ILEUM 130CM, APPLICATION OF VAC ABDOMINAL DRESSING;  Surgeon: Darnell Level, MD;  Location: [facility] ORS;  Service: General;  Laterality: N/A;   LAPAROTOMY N/A 10/18/2017   Procedure: RE-EXPLORATION OF ABDOMEN, ILEOSTOMY CREATION;  Surgeon: Almond Lint, MD;  Location: [facility] ORS;  Service: General;  Laterality: N/A;   LEFT HEART CATH AND CORONARY ANGIOGRAPHY N/A 01/29/2023   Procedure: LEFT HEART CATH AND CORONARY ANGIOGRAPHY;  Surgeon: Swaziland, Peter M, MD;  Location: [facility] INVASIVE CV LAB;  Service: Cardiovascular;  Laterality: N/A;   LUNG SURGERY  03/2017   Benign polyps removed   PATELLA REALIGNMENT Left    POLYPECTOMY     SIGMOIDOSCOPY     TONSILLECTOMY     TRANSCAROTID ARTERY REVASCULARIZATION  Right 04/18/2022   Procedure: Transcarotid Artery Revascularization;  Surgeon: Victorino Sparrow, MD;  Location: [facility] OR;  Service: Vascular;  Laterality: Right;     A IV Location/Drains/Wounds Patient Lines/Drains/Airways Status     Active Line/Drains/Airways     Name Placement date Placement time Site Days   Peripheral IV 04/15/23 18 G Left Antecubital 04/15/23  1732  Antecubital  50   Peripheral IV 06/04/23 20 G Anterior;Left;Proximal Forearm 06/04/23  0611  Forearm  less than 1   Airway 7.5 mm 06/04/23  0818  -- less than 1            Intake/Output Last 24 hours  Intake/Output Summary (Last 24 hours) at 06/04/2023 0828 Last data filed at 06/04/2023 0729 Gross per 24 hour  Intake 97.84 ml  Output --  Net 97.84 ml    Labs/Imaging Results for orders placed or performed during the hospital encounter of 06/04/23 (from the past 48 hour(s))  CBC with Differential     Status: Abnormal   Collection Time: 06/04/23  6:25 AM   Result Value Ref Range   WBC 10.4 4.0 - 10.5 K/uL   RBC 4.19 3.87 - 5.11 MIL/uL   Hemoglobin 11.9 (L) 12.0 - 15.0 g/dL   HCT 01.0 27.2 - 53.6 %   MCV 94.0 80.0 - 100.0 fL   MCH 28.4 26.0 - 34.0 pg   MCHC 30.2 30.0 - 36.0 g/dL   RDW 64.4 03.4 - 74.2 %   Platelets 260 150 - 400 K/uL   nRBC 0.0 0.0 - 0.2 %   Neutrophils Relative % 82 %   Neutro Abs 8.5 (H) 1.7 - 7.7 K/uL   Lymphocytes Relative 14 %   Lymphs Abs  1.5 0.7 - 4.0 K/uL   Monocytes Relative 3 %   Monocytes Absolute 0.3 0.1 - 1.0 K/uL   Eosinophils Relative 1 %   Eosinophils Absolute 0.1 0.0 - 0.5 K/uL   Basophils Relative 0 %   Basophils Absolute 0.0 0.0 - 0.1 K/uL   Immature Granulocytes 0 %   Abs Immature Granulocytes 0.04 0.00 - 0.07 K/uL    Comment: Performed at [facility], [address] [place], [place] [address]  Basic metabolic panel     Status: Abnormal   Collection Time: 06/04/23  6:25 AM  Result Value Ref Range   Sodium 136 135 - 145 mmol/L   Potassium 3.7 3.5 - 5.1 mmol/L   Chloride 104 98 - 111 mmol/L   CO2 17 (L) 22 - 32 mmol/L   Glucose, Bld 116 (H) 70 - 99 mg/dL    Comment: Glucose reference range applies only to samples taken after fasting for at least 8 hours.   BUN 25 (H) 8 - 23 mg/dL   Creatinine, Ser 6.96 (H) 0.44 - 1.00 mg/dL   Calcium 8.0 (L) 8.9 - 10.3 mg/dL   GFR, Estimated 23 (L) >60 mL/min    Comment: (NOTE) Calculated using the CKD-EPI Creatinine Equation (2021)    Anion gap 15 5 - 15    Comment: Performed at [facility], [address], [place], [place] [address]  Brain natriuretic peptide     Status: None   Collection Time: 06/04/23  6:25 AM  Result Value Ref Range   B Natriuretic Peptide 70.7 0.0 - 100.0 pg/mL    Comment: Performed at [facility], [address], [place], [place] [address]  Troponin I (High Sensitivity)     Status: Abnormal   Collection Time: 06/04/23  6:25 AM  Result Value Ref Range   Troponin I (High Sensitivity) 19 (H) <18 ng/L     Comment: (NOTE) Elevated high sensitivity troponin I (hsTnI) values and significant  changes across serial measurements may suggest ACS but many other  chronic and acute conditions are known to elevate hsTnI results.  Refer to the "Links" section for chest pain algorithms and additional  guidance. Performed at [facility], [address], [place], [place] [address]   Lactic acid, plasma     Status: Abnormal   Collection Time: 06/04/23  6:43 AM  Result Value Ref Range   Lactic Acid, Venous 4.0 (HH) 0.5 - 1.9 mmol/L    Comment: CRITICAL RESULT CALLED TO, READ BACK BY AND VERIFIED WITH A.Naythan Douthit RN [phone] 08//21/202 BY G.GANADEN Performed at [facility], [address], [place], [place] [address]    DG Chest Portable 1 View  Result Date: 06/04/2023 CLINICAL DATA:  77 year old female with history of shortness of breath and chest pain. EXAM: PORTABLE CHEST 1 VIEW COMPARISON:  Chest x-ray 04/16/2023. FINDINGS: Worsening interstitial and airspace disease throughout the right lung, most confluent in the periphery of the right lung base. Blunting of the right costophrenic sulcus. Left lung appears clear. No left pleural effusion. No pneumothorax. No evidence of pulmonary edema. Heart size appears borderline enlarged. The patient is rotated to the right on today's exam, resulting in distortion of the mediastinal contours and reduced diagnostic sensitivity and specificity for mediastinal pathology. Atherosclerotic calcifications in the thoracic aorta. IMPRESSION: 1. Worsening severe multilobar right-sided pneumonia with small right parapneumonic pleural effusion. 2. Mild cardiomegaly. 3. Aortic atherosclerosis. Electronically Signed   By: Brayton Mars.D.  On: 06/04/2023 06:40    Pending Labs Unresulted Labs (From admission, onward)     Start     Ordered   06/05/23 0500  CBC  Tomorrow morning,   R        06/04/23 0755   06/05/23 0500  Basic metabolic panel  Tomorrow morning,   R         06/04/23 0755   06/04/23 0758  Culture, Respiratory w Gram Stain  Once,   R        06/04/23 0757   06/04/23 0758  SARS Coronavirus 2 by RT PCR (hospital order, performed in [facility] hospital lab) *cepheid single result test* Anterior Nasal Swab  (Tier 2 - SARS Coronavirus 2 by RT PCR (hospital order, performed in [facility] hospital lab) *cepheid single result test*)  Once,   R        06/04/23 0757   06/04/23 0758  Respiratory (~20 pathogens) panel by PCR  (Respiratory panel by PCR (~20 pathogens, ~24 hr TAT)  w precautions)  Once,   R        06/04/23 0758   06/04/23 0755  Legionella Pneumophila Serogp 1 Ur Ag  Once,   R        06/04/23 0755   06/04/23 0755  Strep pneumoniae urinary antigen (not at [facility])  Once,   R        06/04/23 0755   06/04/23 0754  CBC  (heparin)  Once,   R       Comments: Baseline for heparin therapy IF NOT ALREADY DRAWN.  Notify MD if PLT < 100 K.    06/04/23 0755   06/04/23 0754  Creatinine, serum  (heparin)  Once,   R       Comments: Baseline for heparin therapy IF NOT ALREADY DRAWN.    06/04/23 0755   06/04/23 0737  Culture, blood (single) w Reflex to ID Panel  ONCE - STAT,   STAT        06/04/23 0736   06/04/23 0631  Lactic acid, plasma  (Lactic Acid)  Now then every 2 hours,   R (with STAT occurrences)      06/04/23 0630   06/04/23 0630  Culture, blood (single)  (Undifferentiated -> Now sepsis confirmed (treatment and sepsis specific nursing orders))  ONCE - STAT,   STAT        06/04/23 0630   06/04/23 0611  Resp panel by RT-PCR (RSV, Flu A&B, Covid) Anterior Nasal Swab  Once,   URGENT        06/04/23 0610            Vitals/Pain Today's Vitals   06/04/23 0700 06/04/23 0703 06/04/23 0715 06/04/23 0730  BP: 113/63  133/83 116/84  Pulse: (!) 120 (!) 115 (!) 118 (!) 115  Resp: 18 17 17 20   Temp:      TempSrc:      SpO2: (!) 80% (!) 85% (!) 84% 91%  Weight:      Height:      PainSc:        Isolation Precautions Droplet  precaution  Medications Medications  lactated ringers infusion (1,000 mLs Intravenous New Bag/Given 06/04/23 0648)  vancomycin (VANCOREADY) IVPB 1250 mg/250 mL (1,250 mg Intravenous New Bag/Given 06/04/23 0732)  lactated ringers bolus 1,000 mL (has no administration in time range)    And  lactated ringers bolus 1,000 mL (has no administration in time range)    And  lactated ringers bolus  250 mL (has no administration in time range)  doxycycline (VIBRAMYCIN) 100 mg in sodium chloride 0.9 % 250 mL IVPB (has no administration in time range)  docusate (COLACE) 50 MG/5ML liquid 100 mg (has no administration in time range)  polyethylene glycol (MIRALAX / GLYCOLAX) packet 17 g (has no administration in time range)  heparin injection 5,000 Units (has no administration in time range)  ondansetron (ZOFRAN) injection 4 mg (has no administration in time range)  famotidine (PEPCID) tablet 20 mg (has no administration in time range)  docusate (COLACE) 50 MG/5ML liquid 100 mg (has no administration in time range)  polyethylene glycol (MIRALAX / GLYCOLAX) packet 17 g (has no administration in time range)  fentaNYL in NS (51mcg/ml) infusion-PREMIX (has no administration in time range)  fentaNYL (SUBLIMAZE) bolus via infusion 50-100 mcg (has no administration in time range)  midazolam (VERSED) injection 1-2 mg (has no administration in time range)  insulin aspart (novoLOG) injection 1-3 Units (has no administration in time range)  ceFEPIme (MAXIPIME) 2 g in sodium chloride 0.9 % 100 mL IVPB (0 g Intravenous Stopped 06/04/23 0729)  fentaNYL (SUBLIMAZE) injection 50 mcg (50 mcg Intravenous Given 06/04/23 0826)  ketamine 50 mg in normal saline 5 mL (10 mg/mL) syringe (68 mg Intravenous Given 06/04/23 0814)  rocuronium (ZEMURON) injection 70 mg (70 mg Intravenous Given 06/04/23 0815)  midazolam (VERSED) injection 2 mg (2 mg Intravenous Given 06/04/23 0812)  fentaNYL (SUBLIMAZE) injection 100 mcg (100  mcg Intravenous Given 06/04/23 0825)    Mobility non-ambulatory     Focused Assessments    R Recommendations: See Admitting Provider Note  Report given to:   Additional Notes: patient is intubated.

## 2023-06-05 DIAGNOSIS — J9601 Acute respiratory failure with hypoxia: Secondary | ICD-10-CM | POA: Diagnosis not present

## 2023-06-05 DIAGNOSIS — J189 Pneumonia, unspecified organism: Secondary | ICD-10-CM | POA: Diagnosis not present

## 2023-06-05 DIAGNOSIS — N189 Chronic kidney disease, unspecified: Secondary | ICD-10-CM | POA: Diagnosis not present

## 2023-06-05 LAB — POCT I-STAT 7, (LYTES, BLD GAS, ICA,H+H)
Acid-base deficit: 8 mmol/L — ABNORMAL HIGH (ref 0.0–2.0)
Acid-base deficit: 5 mmol/L — ABNORMAL HIGH (ref 0.0–2.0)
Bicarbonate: 20.2 mmol/L (ref 20.0–28.0)
Bicarbonate: 21.3 mmol/L (ref 20.0–28.0)
Calcium, Ion: 1.22 mmol/L (ref 1.15–1.40)
Calcium, Ion: 1.26 mmol/L (ref 1.15–1.40)
HCT: 31 % — ABNORMAL LOW (ref 36.0–46.0)
HCT: 29 % — ABNORMAL LOW (ref 36.0–46.0)
Hemoglobin: 10.5 g/dL — ABNORMAL LOW (ref 12.0–15.0)
Hemoglobin: 9.9 g/dL — ABNORMAL LOW (ref 12.0–15.0)
O2 Saturation: 94 %
O2 Saturation: 89 %
Patient temperature: 98.1
Patient temperature: 97.4
Potassium: 4.6 mmol/L (ref 3.5–5.1)
Potassium: 4.5 mmol/L (ref 3.5–5.1)
Sodium: 132 mmol/L — ABNORMAL LOW (ref 135–145)
Sodium: 135 mmol/L (ref 135–145)
TCO2: 22 mmol/L (ref 22–32)
TCO2: 23 mmol/L (ref 22–32)
pCO2 arterial: 50.7 mmHg — ABNORMAL HIGH (ref 32–48)
pCO2 arterial: 43.7 mmHg (ref 32–48)
pH, Arterial: 7.206 — ABNORMAL LOW (ref 7.35–7.45)
pH, Arterial: 7.293 — ABNORMAL LOW (ref 7.35–7.45)
pO2, Arterial: 84 mmHg (ref 83–108)
pO2, Arterial: 60 mmHg — ABNORMAL LOW (ref 83–108)

## 2023-06-05 LAB — LACTIC ACID, PLASMA: Lactic Acid, Venous: 1.3 mmol/L (ref 0.5–1.9)

## 2023-06-05 LAB — BASIC METABOLIC PANEL
Anion gap: 11 (ref 5–15)
BUN: 29 mg/dL — ABNORMAL HIGH (ref 8–23)
CO2: 16 mmol/L — ABNORMAL LOW (ref 22–32)
Calcium: 7.8 mg/dL — ABNORMAL LOW (ref 8.9–10.3)
Chloride: 104 mmol/L (ref 98–111)
Creatinine, Ser: 1.93 mg/dL — ABNORMAL HIGH (ref 0.44–1.00)
GFR, Estimated: 27 mL/min — ABNORMAL LOW (ref >60)
Glucose, Bld: 228 mg/dL — ABNORMAL HIGH (ref 70–99)
Potassium: 4.6 mmol/L (ref 3.5–5.1)
Sodium: 131 mmol/L — ABNORMAL LOW (ref 135–145)

## 2023-06-05 LAB — CBC
HCT: 34.2 % — ABNORMAL LOW (ref 36.0–46.0)
Hemoglobin: 10.5 g/dL — ABNORMAL LOW (ref 12.0–15.0)
MCH: 29.7 pg (ref 26.0–34.0)
MCHC: 30.7 g/dL (ref 30.0–36.0)
MCV: 96.6 fL (ref 80.0–100.0)
Platelets: 181 10*3/uL (ref 150–400)
RBC: 3.54 MIL/uL — ABNORMAL LOW (ref 3.87–5.11)
RDW: 13.3 % (ref 11.5–15.5)
WBC: 37.5 10*3/uL — ABNORMAL HIGH (ref 4.0–10.5)
nRBC: 0 % (ref 0.0–0.2)

## 2023-06-05 LAB — GLUCOSE, CAPILLARY
Glucose-Capillary: 227 mg/dL — ABNORMAL HIGH (ref 70–99)
Glucose-Capillary: 223 mg/dL — ABNORMAL HIGH (ref 70–99)
Glucose-Capillary: 235 mg/dL — ABNORMAL HIGH (ref 70–99)
Glucose-Capillary: 137 mg/dL — ABNORMAL HIGH (ref 70–99)
Glucose-Capillary: 168 mg/dL — ABNORMAL HIGH (ref 70–99)
Glucose-Capillary: 162 mg/dL — ABNORMAL HIGH (ref 70–99)

## 2023-06-05 LAB — MAGNESIUM
Magnesium: 1.1 mg/dL — ABNORMAL LOW (ref 1.7–2.4)
Magnesium: 2.7 mg/dL — ABNORMAL HIGH (ref 1.7–2.4)

## 2023-06-05 LAB — PHOSPHORUS: Phosphorus: 3.5 mg/dL (ref 2.5–4.6)

## 2023-06-05 MED ORDER — VENLAFAXINE HCL 75 MG PO TABS
75.0000 mg | ORAL_TABLET | Freq: Two times a day (BID) | ORAL | Status: DC
  Administered 2023-06-05: 75 mg
  Filled 2023-06-05 (×2): qty 1

## 2023-06-05 MED ORDER — VENLAFAXINE HCL 75 MG PO TABS
75.0000 mg | ORAL_TABLET | Freq: Two times a day (BID) | ORAL | Status: DC
  Filled 2023-06-05: qty 1

## 2023-06-05 MED ORDER — CHLORHEXIDINE GLUCONATE CLOTH 2 % EX PADS
6.0000 | MEDICATED_PAD | Freq: Every day | CUTANEOUS | Status: DC
  Administered 2023-06-05 – 2023-07-03 (×26): 6 via TOPICAL

## 2023-06-05 MED ORDER — MAGNESIUM SULFATE 4 GM/100ML IV SOLN
4.0000 g | Freq: Once | INTRAVENOUS | Status: AC
  Administered 2023-06-05: 4 g via INTRAVENOUS
  Filled 2023-06-05: qty 100

## 2023-06-05 MED ORDER — MAGNESIUM SULFATE 2 GM/50ML IV SOLN
2.0000 g | Freq: Once | INTRAVENOUS | Status: AC
  Administered 2023-06-05: 2 g via INTRAVENOUS
  Filled 2023-06-05: qty 50

## 2023-06-05 MED ORDER — INSULIN ASPART 100 UNIT/ML IJ SOLN
0.0000 [IU] | INTRAMUSCULAR | Status: DC
  Administered 2023-06-05: 3 [IU] via SUBCUTANEOUS
  Administered 2023-06-05: 5 [IU] via SUBCUTANEOUS
  Administered 2023-06-05: 3 [IU] via SUBCUTANEOUS
  Administered 2023-06-05: 2 [IU] via SUBCUTANEOUS
  Administered 2023-06-06 (×2): 3 [IU] via SUBCUTANEOUS
  Administered 2023-06-06: 5 [IU] via SUBCUTANEOUS
  Administered 2023-06-06 – 2023-06-07 (×4): 3 [IU] via SUBCUTANEOUS
  Administered 2023-06-07: 8 [IU] via SUBCUTANEOUS

## 2023-06-05 MED ORDER — NON FORMULARY
1000.0000 mL | Status: DC
  Administered 2023-06-05: 1000 mL

## 2023-06-05 MED ORDER — VENLAFAXINE HCL 37.5 MG PO TABS
37.5000 mg | ORAL_TABLET | Freq: Two times a day (BID) | ORAL | Status: DC
  Administered 2023-06-05 – 2023-06-08 (×6): 37.5 mg
  Filled 2023-06-05 (×6): qty 1

## 2023-06-05 MED ORDER — DEXMEDETOMIDINE HCL IN NACL 400 MCG/100ML IV SOLN
0.0000 ug/kg/h | INTRAVENOUS | Status: DC
  Administered 2023-06-05: 0.8 ug/kg/h via INTRAVENOUS
  Administered 2023-06-05: 0.4 ug/kg/h via INTRAVENOUS
  Administered 2023-06-05: 0.7 ug/kg/h via INTRAVENOUS
  Administered 2023-06-06: 0.4 ug/kg/h via INTRAVENOUS
  Administered 2023-06-06: 0.7 ug/kg/h via INTRAVENOUS
  Administered 2023-06-06: 1 ug/kg/h via INTRAVENOUS
  Administered 2023-06-07: 0.7 ug/kg/h via INTRAVENOUS
  Administered 2023-06-07: 0.8 ug/kg/h via INTRAVENOUS
  Administered 2023-06-07: 1 ug/kg/h via INTRAVENOUS
  Administered 2023-06-07: 0.8 ug/kg/h via INTRAVENOUS
  Administered 2023-06-07: 1.1 ug/kg/h via INTRAVENOUS
  Administered 2023-06-08 (×2): 0.4 ug/kg/h via INTRAVENOUS
  Administered 2023-06-08: 0.8 ug/kg/h via INTRAVENOUS
  Filled 2023-06-05 (×13): qty 100

## 2023-06-05 NOTE — Progress Notes (Signed)
Brief Nutrition Support Note  RN alerted RD and MD that pt had a small amount of yellow emesis while laying flat for a bath. Tube feeds were held prior to laying patient flat.  Due to pt with chronic pancreatitis requiring PERT, pt has been on higher rate intermittent feeds using Vital 1.5 formula. Intermittent feeds have been timed to coincide with Creon administration. Discussed pt with MD. Will transition to continuous tube feeding regimen using Vivonex RTF tube feeding formula which contains 10% fat.  Discussed the above information with unit Pharmacist. Appreciate assistance with procuring this tube feeding formula.  RD to adjust tube feeding orders: - Start Vivonex RTF @ 30 ml/hr and increase rate by 10 ml every 6 hours to goal rate of 70 ml/hr (1680 ml/day) - PROSource TF20 60 ml daily  Tube feeding regimen at goal rate provides 1760 kcal, 104 grams of protein, and 1425 ml of H2O.  RD will continue to follow pt during admission.   Mertie Clause, MS, RD, LDN Registered Dietitian II Please see AMiON for contact information.

## 2023-06-05 NOTE — Plan of Care (Signed)
  Problem: Clinical Measurements: Goal: Respiratory complications will improve Outcome: Progressing   Problem: Nutrition: Goal: Adequate nutrition will be maintained Outcome: Progressing   Problem: Elimination: Goal: Will not experience complications related to bowel motility Outcome: Progressing   Problem: Safety: Goal: Ability to remain free from injury will improve Outcome: Progressing   Problem: Skin Integrity: Goal: Risk for impaired skin integrity will decrease Outcome: Progressing

## 2023-06-05 NOTE — Progress Notes (Signed)
[facility] ADULT ICU REPLACEMENT PROTOCOL   The patient does apply for the [facility] Adult ICU Electrolyte Replacment Protocol based on the criteria listed below:   1.Exclusion criteria: TCTS, ECMO, Dialysis, and Myasthenia Gravis patients 2. Is GFR >/= 30 ml/min? No  Patient's GFR today is 27 3. Is SCr </= 2? Yes.   Patient's SCr is 1.93 mg/dL 4. Did SCr increase >/= 0.5 in 24 hours? No. 5.Pt's weight >40kg  Yes.   6. Abnormal electrolyte(s): mag 1.1  7. Electrolytes replaced per protocol 8.  Call MD STAT for K+ </= 2.5, Phos </= 1, or Mag </= 1 Physician:  Dr. Matt Holmes 06/05/2023 3:33 AM
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
## 2023-06-05 NOTE — Inpatient Diabetes Management (Signed)
Inpatient Diabetes Program Recommendations  AACE/ADA: New Consensus Statement on Inpatient Glycemic Control (2015)  Target Ranges:  Prepandial:   less than 140 mg/dL      Peak postprandial:   less than 180 mg/dL (1-2 hours)      Critically ill patients:  140 - 180 mg/dL   Lab Results  Component Value Date   GLUCAP 235 (H) 06/05/2023   HGBA1C 6.5 (H) 01/02/2023    Review of Glycemic Control  Latest Reference Range & Units 06/04/23 23:28 06/05/23 03:19 06/05/23 07:42 06/05/23 11:10  Glucose-Capillary 70 - 99 mg/dL 161 (H) 096 (H) 045 (H) 235 (H)   Diabetes history: DM 2 Outpatient Diabetes medications:  None noted Current orders for Inpatient glycemic control:  Novolog 0-15 units q 4 hours Solumedrol 40 mg q 24 hours Vivonex - goal rate 70 ml/hr Inpatient Diabetes Program Recommendations:    Note blood sugars > goal. Note change in tube feed formula.  If blood sugars continue to be >goal, consider adding Novolog 3 units q 4 hours to cover CHO in tube feeds.   Thanks,  Beryl Meager, RN, BC-ADM Inpatient Diabetes Coordinator Pager [phone]  (8a-5p)

## 2023-06-06 ENCOUNTER — Inpatient Hospital Stay (HOSPITAL_COMMUNITY): Payer: Medicare HMO

## 2023-06-06 DIAGNOSIS — R918 Other nonspecific abnormal finding of lung field: Secondary | ICD-10-CM | POA: Diagnosis not present

## 2023-06-06 DIAGNOSIS — N189 Chronic kidney disease, unspecified: Secondary | ICD-10-CM

## 2023-06-06 DIAGNOSIS — J9601 Acute respiratory failure with hypoxia: Secondary | ICD-10-CM | POA: Diagnosis not present

## 2023-06-06 DIAGNOSIS — J90 Pleural effusion, not elsewhere classified: Secondary | ICD-10-CM | POA: Diagnosis not present

## 2023-06-06 DIAGNOSIS — J9600 Acute respiratory failure, unspecified whether with hypoxia or hypercapnia: Secondary | ICD-10-CM | POA: Diagnosis not present

## 2023-06-06 DIAGNOSIS — J189 Pneumonia, unspecified organism: Secondary | ICD-10-CM | POA: Diagnosis not present

## 2023-06-06 DIAGNOSIS — R140 Abdominal distension (gaseous): Secondary | ICD-10-CM | POA: Diagnosis not present

## 2023-06-06 DIAGNOSIS — Z4682 Encounter for fitting and adjustment of non-vascular catheter: Secondary | ICD-10-CM | POA: Diagnosis not present

## 2023-06-06 LAB — POCT I-STAT 7, (LYTES, BLD GAS, ICA,H+H)
Acid-base deficit: 6 mmol/L — ABNORMAL HIGH (ref 0.0–2.0)
Acid-base deficit: 4 mmol/L — ABNORMAL HIGH (ref 0.0–2.0)
Bicarbonate: 20.7 mmol/L (ref 20.0–28.0)
Bicarbonate: 22.6 mmol/L (ref 20.0–28.0)
Calcium, Ion: 1.28 mmol/L (ref 1.15–1.40)
Calcium, Ion: 1.3 mmol/L (ref 1.15–1.40)
HCT: 31 % — ABNORMAL LOW (ref 36.0–46.0)
HCT: 32 % — ABNORMAL LOW (ref 36.0–46.0)
Hemoglobin: 10.5 g/dL — ABNORMAL LOW (ref 12.0–15.0)
Hemoglobin: 10.9 g/dL — ABNORMAL LOW (ref 12.0–15.0)
O2 Saturation: 92 %
O2 Saturation: 89 %
Patient temperature: 97.9
Patient temperature: 97.6
Potassium: 4.8 mmol/L (ref 3.5–5.1)
Potassium: 4.7 mmol/L (ref 3.5–5.1)
Sodium: 134 mmol/L — ABNORMAL LOW (ref 135–145)
Sodium: 134 mmol/L — ABNORMAL LOW (ref 135–145)
TCO2: 22 mmol/L (ref 22–32)
TCO2: 24 mmol/L (ref 22–32)
pCO2 arterial: 42 mmHg (ref 32–48)
pCO2 arterial: 45.9 mmHg (ref 32–48)
pH, Arterial: 7.298 — ABNORMAL LOW (ref 7.35–7.45)
pH, Arterial: 7.298 — ABNORMAL LOW (ref 7.35–7.45)
pO2, Arterial: 68 mmHg — ABNORMAL LOW (ref 83–108)
pO2, Arterial: 60 mmHg — ABNORMAL LOW (ref 83–108)

## 2023-06-06 LAB — GLUCOSE, CAPILLARY
Glucose-Capillary: 159 mg/dL — ABNORMAL HIGH (ref 70–99)
Glucose-Capillary: 171 mg/dL — ABNORMAL HIGH (ref 70–99)
Glucose-Capillary: 176 mg/dL — ABNORMAL HIGH (ref 70–99)
Glucose-Capillary: 198 mg/dL — ABNORMAL HIGH (ref 70–99)
Glucose-Capillary: 160 mg/dL — ABNORMAL HIGH (ref 70–99)
Glucose-Capillary: 219 mg/dL — ABNORMAL HIGH (ref 70–99)

## 2023-06-06 LAB — COMPREHENSIVE METABOLIC PANEL
ALT: 17 U/L (ref 0–44)
AST: 25 U/L (ref 15–41)
Albumin: 2.1 g/dL — ABNORMAL LOW (ref 3.5–5.0)
Alkaline Phosphatase: 75 U/L (ref 38–126)
Anion gap: 9 (ref 5–15)
BUN: 37 mg/dL — ABNORMAL HIGH (ref 8–23)
CO2: 20 mmol/L — ABNORMAL LOW (ref 22–32)
Calcium: 8.4 mg/dL — ABNORMAL LOW (ref 8.9–10.3)
Chloride: 103 mmol/L (ref 98–111)
Creatinine, Ser: 1.67 mg/dL — ABNORMAL HIGH (ref 0.44–1.00)
GFR, Estimated: 32 mL/min — ABNORMAL LOW (ref >60)
Glucose, Bld: 196 mg/dL — ABNORMAL HIGH (ref 70–99)
Potassium: 4.9 mmol/L (ref 3.5–5.1)
Sodium: 132 mmol/L — ABNORMAL LOW (ref 135–145)
Total Bilirubin: 0.7 mg/dL (ref 0.3–1.2)
Total Protein: 5.4 g/dL — ABNORMAL LOW (ref 6.5–8.1)

## 2023-06-06 LAB — LEGIONELLA PNEUMOPHILA SEROGP 1 UR AG: L. pneumophila Serogp 1 Ur Ag: NEGATIVE

## 2023-06-06 LAB — PHOSPHORUS: Phosphorus: 2.6 mg/dL (ref 2.5–4.6)

## 2023-06-06 LAB — CBC
HCT: 35.1 % — ABNORMAL LOW (ref 36.0–46.0)
Hemoglobin: 11.2 g/dL — ABNORMAL LOW (ref 12.0–15.0)
MCH: 29.6 pg (ref 26.0–34.0)
MCHC: 31.9 g/dL (ref 30.0–36.0)
MCV: 92.6 fL (ref 80.0–100.0)
Platelets: 159 10*3/uL (ref 150–400)
RBC: 3.79 MIL/uL — ABNORMAL LOW (ref 3.87–5.11)
RDW: 13.4 % (ref 11.5–15.5)
WBC: 34.8 10*3/uL — ABNORMAL HIGH (ref 4.0–10.5)
nRBC: 0 % (ref 0.0–0.2)

## 2023-06-06 LAB — CULTURE, RESPIRATORY W GRAM STAIN: Culture: NORMAL

## 2023-06-06 LAB — MAGNESIUM: Magnesium: 2.6 mg/dL — ABNORMAL HIGH (ref 1.7–2.4)

## 2023-06-06 MED ORDER — FUROSEMIDE 10 MG/ML IJ SOLN
40.0000 mg | Freq: Once | INTRAMUSCULAR | Status: AC
  Administered 2023-06-06: 40 mg via INTRAVENOUS
  Filled 2023-06-06: qty 4

## 2023-06-06 MED ORDER — HYDRALAZINE HCL 20 MG/ML IJ SOLN
10.0000 mg | INTRAMUSCULAR | Status: DC | PRN
  Administered 2023-06-06 – 2023-06-09 (×5): 10 mg via INTRAVENOUS
  Filled 2023-06-06 (×5): qty 1

## 2023-06-06 MED ORDER — VIVONEX RTF PO LIQD
1000.0000 mL | ORAL | Status: DC
  Administered 2023-06-06 – 2023-06-16 (×14): 1000 mL
  Filled 2023-06-06 (×21): qty 1000
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
## 2023-06-06 NOTE — Procedures (Signed)
Cortrak  Person Inserting Tube:  Hunter, Rachel D, RD Tube Type:  Cortrak - 43 inches Tube Size:  10 Tube Location:  Left nare Secured by: Bridle Technique Used to Measure Tube Placement:  Marking at nare/corner of mouth Cortrak Secured At:  66 cm Procedure Comments:  Cortrak Tube Team Note:  Consult received to place a Cortrak feeding tube.   X-ray is required, abdominal x-ray has been ordered by the Cortrak team. Please confirm tube placement before using the Cortrak tube.   If the tube becomes dislodged please keep the tube and contact the Cortrak team at [email or web address] for replacement.  If after hours and replacement cannot be delayed, place a NG tube and confirm placement with an abdominal x-ray.    Rachel Hunter, RD, LDN Clinical Dietitian RD pager # available in AMION  After hours/weekend pager # available in AMION    

## 2023-06-06 NOTE — Plan of Care (Signed)
  Problem: Clinical Measurements: Goal: Ability to maintain clinical measurements within normal limits will improve Outcome: Progressing   Problem: Nutrition: Goal: Adequate nutrition will be maintained Outcome: Progressing   Problem: Elimination: Goal: Will not experience complications related to bowel motility Outcome: Progressing   Problem: Pain Managment: Goal: General experience of comfort will improve Outcome: Progressing   Problem: Safety: Goal: Ability to remain free from injury will improve Outcome: Progressing   Problem: Skin Integrity: Goal: Risk for impaired skin integrity will decrease Outcome: Progressing   Problem: Metabolic: Goal: Ability to maintain appropriate glucose levels will improve Outcome: Progressing   Problem: Nutritional: Goal: Maintenance of adequate nutrition will improve Outcome: Progressing   Problem: Skin Integrity: Goal: Risk for impaired skin integrity will decrease Outcome: Progressing

## 2023-06-06 NOTE — Progress Notes (Signed)
Nutrition Follow-up  DOCUMENTATION CODES:   Not applicable  INTERVENTION:   Continue enteral nutrition via OG tube: - Continue to advance Vivonex RTF to goal rate of 70 ml/hr (1680 ml/day) - PROSource TF20 60 ml daily  Tube feeding regimen at goal rate provides 1760 kcal, 104 grams of protein, and 1425 ml of H2O.  NUTRITION DIAGNOSIS:   Inadequate oral intake related to inability to eat as evidenced by NPO status.  Ongoing, being addressed via TF  GOAL:   Patient will meet greater than or equal to 90% of their needs  Met via TF at goal rate  MONITOR:   Vent status, Labs, Weight trends, TF tolerance, I & O's  REASON FOR ASSESSMENT:   Ventilator, Consult Enteral/tube feeding initiation and management  ASSESSMENT:   77 year old female who presented to the ED on 8/21 with respiratory distress. PMH of recurrent pneumonias, vitamin B12 deficiency, stroke, seizures, HTN, GERD, DM, DVT, chronic pancreatitis, CAD, colostomy. Pt admitted with pneumonia, sepsis.  8/21 - intubated, intermittent TF via pump started 8/22 - pt with small volume emesis, changed to continuous TF with Vivonex 8/23 - plan to exchange NG tube for Cortrak and bridle  Discussed pt with RN and during ICU rounds. Pt with Vivonex RTF tube feeds infusing via NG tube at rate of 50 ml/hr. Goal rate remains 70 ml/hr. Pt is being slowly advanced to goal rate and tolerating well at this time. No additional episodes of emesis per RN.  Discussed pt with NP. Plan to exchange NG tube for Cortrak and bridle today. RN has been giving Creon per tube and this has been very difficult to do given smaller bore size of NG tube. High likelihood of Creon clogging Cortrak tube. Discussed with Pharmacy and CCM NP. Okay to d/c Creon at this time given pt is on Vivonex RTF tube feeding formula which is 10% fat.  Pt with type 7 stools and FMS in place. Stools do not appear to resemble steatorrhea at this time. Will continue to monitor  stool consistency, frequency, and volume. Can consider RELiZORB immobilized lipase cartridges if concern for steatorrhea arises.  Admit weight: 68 kg (likely estimated) Current weight: 85.4 kg  Current TF: Vivonex RTF @ 50 ml/hr, PROSource TF20 60 ml daily  Patient remains intubated on ventilator support MV: 12.4 L/min Temp (24hrs), Avg:97.7 F (36.5 C), Min:97.4 F (36.3 C), Max:97.9 F (36.6 C)  Drips: Precedex Fentanyl  Medications reviewed and include: colace, SSI every 4 hours, Creon 12,000 units every 4 hours, IV solu-medrol, IV protonix, miralax, IV abx  Labs reviewed: sodium 132, BUN 37, creatinine 1.67, magnesium 2.6, WBC 34.8 CBG's: 137-235 x 24 hours  UOP: 1190 ml x 24 hours FMS: 360 ml + 1 unmeasured occurrence x 24 hours I/O's: +11.7 L since admit  Diet Order:   Diet Order             Diet NPO time specified  Diet effective now                   EDUCATION NEEDS:   Not appropriate for education at this time  Skin:  Skin Assessment: Reviewed RN Assessment  Last BM:  06/05/23 multiple type 7 via FMS  Height:   Ht Readings from Last 1 Encounters:  06/04/23 5\' 3"  (1.6 m)    Weight:   Wt Readings from Last 1 Encounters:  06/06/23 85.4 kg    Ideal Body Weight:  52.3 kg  BMI:  Body mass index  is 33.35 kg/m.  Estimated Nutritional Needs:   Kcal:  1650-1850  Protein:  85-100 grams  Fluid:  1.6-1.8 L    Mertie Clause, MS, RD, LDN Registered Dietitian II Please see AMiON for contact information.

## 2023-06-06 NOTE — Progress Notes (Signed)
eLink Physician-Brief Progress Note Patient Name: [name] DOB: 1946-04-24 MRN: [id number]   Date of Service  06/06/2023  HPI/Events of Note  Patient with urinary retention (bladder scan positive for 625 ml of urine).  eICU Interventions  Foley catheter ordered.        Thomasene Lot Deja Kaigler 06/06/2023, 12:11 AM

## 2023-06-07 DIAGNOSIS — J9601 Acute respiratory failure with hypoxia: Secondary | ICD-10-CM | POA: Diagnosis not present

## 2023-06-07 LAB — BASIC METABOLIC PANEL
Anion gap: 14 (ref 5–15)
BUN: 52 mg/dL — ABNORMAL HIGH (ref 8–23)
CO2: 21 mmol/L — ABNORMAL LOW (ref 22–32)
Calcium: 8.7 mg/dL — ABNORMAL LOW (ref 8.9–10.3)
Chloride: 97 mmol/L — ABNORMAL LOW (ref 98–111)
Creatinine, Ser: 1.71 mg/dL — ABNORMAL HIGH (ref 0.44–1.00)
GFR, Estimated: 31 mL/min — ABNORMAL LOW (ref >60)
Glucose, Bld: 192 mg/dL — ABNORMAL HIGH (ref 70–99)
Potassium: 4.9 mmol/L (ref 3.5–5.1)
Sodium: 132 mmol/L — ABNORMAL LOW (ref 135–145)

## 2023-06-07 LAB — GLUCOSE, CAPILLARY
Glucose-Capillary: 199 mg/dL — ABNORMAL HIGH (ref 70–99)
Glucose-Capillary: 267 mg/dL — ABNORMAL HIGH (ref 70–99)
Glucose-Capillary: 236 mg/dL — ABNORMAL HIGH (ref 70–99)
Glucose-Capillary: 252 mg/dL — ABNORMAL HIGH (ref 70–99)
Glucose-Capillary: 266 mg/dL — ABNORMAL HIGH (ref 70–99)
Glucose-Capillary: 199 mg/dL — ABNORMAL HIGH (ref 70–99)

## 2023-06-07 LAB — MAGNESIUM: Magnesium: 2.2 mg/dL (ref 1.7–2.4)

## 2023-06-07 LAB — PHOSPHORUS: Phosphorus: 3.2 mg/dL (ref 2.5–4.6)

## 2023-06-07 MED ORDER — MIRTAZAPINE 15 MG PO TABS
15.0000 mg | ORAL_TABLET | Freq: Every day | ORAL | Status: DC
  Administered 2023-06-07 – 2023-06-09 (×3): 15 mg
  Filled 2023-06-07 (×4): qty 1

## 2023-06-07 MED ORDER — INSULIN ASPART 100 UNIT/ML IJ SOLN
0.0000 [IU] | INTRAMUSCULAR | Status: DC
  Administered 2023-06-07: 11 [IU] via SUBCUTANEOUS
  Administered 2023-06-07: 7 [IU] via SUBCUTANEOUS
  Administered 2023-06-07: 11 [IU] via SUBCUTANEOUS
  Administered 2023-06-08 (×3): 4 [IU] via SUBCUTANEOUS
  Administered 2023-06-08: 11 [IU] via SUBCUTANEOUS
  Administered 2023-06-08: 4 [IU] via SUBCUTANEOUS
  Administered 2023-06-08 (×2): 7 [IU] via SUBCUTANEOUS
  Administered 2023-06-09 (×2): 3 [IU] via SUBCUTANEOUS
  Administered 2023-06-09 – 2023-06-10 (×3): 4 [IU] via SUBCUTANEOUS
  Administered 2023-06-10: 7 [IU] via SUBCUTANEOUS
  Administered 2023-06-10: 11 [IU] via SUBCUTANEOUS
  Administered 2023-06-10 – 2023-06-12 (×10): 4 [IU] via SUBCUTANEOUS
  Administered 2023-06-12: 3 [IU] via SUBCUTANEOUS
  Administered 2023-06-12 (×3): 4 [IU] via SUBCUTANEOUS
  Administered 2023-06-13 (×2): 3 [IU] via SUBCUTANEOUS
  Administered 2023-06-13 (×2): 4 [IU] via SUBCUTANEOUS
  Administered 2023-06-13 – 2023-06-14 (×2): 3 [IU] via SUBCUTANEOUS
  Administered 2023-06-14 (×2): 4 [IU] via SUBCUTANEOUS
  Administered 2023-06-15: 12 [IU] via SUBCUTANEOUS
  Administered 2023-06-15: 3 [IU] via SUBCUTANEOUS
  Administered 2023-06-15: 4 [IU] via SUBCUTANEOUS
  Administered 2023-06-16: 3 [IU] via SUBCUTANEOUS
  Administered 2023-06-16: 4 [IU] via SUBCUTANEOUS
  Administered 2023-06-16 – 2023-06-17 (×4): 3 [IU] via SUBCUTANEOUS
  Administered 2023-06-17 (×2): 4 [IU] via SUBCUTANEOUS
  Administered 2023-06-18 (×3): 3 [IU] via SUBCUTANEOUS
  Administered 2023-06-18 – 2023-06-19 (×3): 4 [IU] via SUBCUTANEOUS
  Administered 2023-06-19: 3 [IU] via SUBCUTANEOUS
  Administered 2023-06-19: 4 [IU] via SUBCUTANEOUS
  Administered 2023-06-20: 3 [IU] via SUBCUTANEOUS
  Administered 2023-06-20: 7 [IU] via SUBCUTANEOUS
  Administered 2023-06-20: 4 [IU] via SUBCUTANEOUS
  Administered 2023-06-21: 7 [IU] via SUBCUTANEOUS

## 2023-06-07 MED ORDER — MIDAZOLAM HCL 2 MG/2ML IJ SOLN
1.0000 mg | INTRAMUSCULAR | Status: DC | PRN
  Administered 2023-06-07 – 2023-06-10 (×14): 2 mg via INTRAVENOUS
  Filled 2023-06-07 (×14): qty 2

## 2023-06-07 MED ORDER — INSULIN ASPART 100 UNIT/ML IJ SOLN
4.0000 [IU] | INTRAMUSCULAR | Status: DC
  Administered 2023-06-07 – 2023-06-08 (×6): 4 [IU] via SUBCUTANEOUS

## 2023-06-07 NOTE — Progress Notes (Signed)
NAME:  [name], MRN:  [id number], DOB:  09/15/1946, LOS: 3 ADMISSION DATE:  06/04/2023, CONSULTATION DATE:  06/04/23 REFERRING MD:  Ray-ED, CHIEF COMPLAINT:  hypoxia   History of Present Illness:  [name] is a 77 year old woman with a history of recurrent pneumonias,, vitamin 20 B12 deficiency, stroke, seizures, hypertension, GERD, diabetes, DVT who presented to the hospital with shortness of breath and cough.  She has been admitted 4 times previously this year.  She woke up from sleep and respiratory distress.  With EMS she had oxygen saturations in the 70s on room air and despite nonrebreather only rose to mid 80s.  EMS administered DuoNebs, Solu-Medrol, magnesium.  She was placed on BiPAP in the emergency department.  She was administered vancomycin and cefepime after 1 blood culture was collected.  She failed to significantly improve on BiPAP.  She is not on inhalers at home.  Pertinent  Medical History  Hypertension Diabetes Seizures GERD B12 deficiency Stroke Sleep apnea Chronic pancreatitis CAD Colostomy  Significant Hospital Events: Including procedures, antibiotic start and stop dates in addition to other pertinent events   8/21 admitted, intubated 8/22 Remains on vent with some issues with agitation, added Precedex drip  8/24 agitated on trying to reduce sedation   Interim History / Subjective:  Agitated when trying to reduce sedation Did receive Versed 2, 2, 1 mg through the night On Precedex and fentanyl at present  Objective   Blood pressure (!) 133/50, pulse 75, temperature 97.8 F (36.6 C), temperature source Axillary, resp. rate (!) 26, height 5\' 3"  (1.6 m), weight 85.3 kg, SpO2 100%.    Vent Mode: PRVC FiO2 (%):  [40 %] 40 % Set Rate:  [26 bmp-35 bmp] 26 bmp Vt Set:  [360 mL] 360 mL PEEP:  [8 cmH20-10 cmH20] 8 cmH20 Plateau Pressure:  [16 cmH20-18 cmH20] 16 cmH20   Intake/Output Summary (Last 24 hours) at 06/07/2023 1051 Last data filed at  06/07/2023 0700 Gross per 24 hour  Intake 2093.14 ml  Output 1735 ml  Net 358.14 ml   Filed Weights   06/05/23 0515 06/06/23 0500 06/07/23 0500  Weight: 85.4 kg 85.4 kg 85.3 kg    Examination: General: Acute on chronically ill-appearing HEENT: Endotracheal tube in place, moist oral mucosa Neuro: Sedated, not able to follow commands CV: S1-S2 appreciated with no murmur PULM: Clear breath sounds anteriorly, few basal rales GI: Soft, bowel sounds appreciated Extremities: warm/dry, no edema  Skin: no rashes or lesions  I reviewed nursing notes, last 24 h vitals and pain scores, last 48 h intake and output, last 24 h labs and trends, and last 24 h imaging results.  Resolved Hospital Problem list     Assessment & Plan:   Acute respiratory failure with hypoxia Right lower lobe and middle lobe pneumonia -Continue mechanical ventilation per ARDS protocol -Target TVol 6-8cc/kgIBW -Target Plateau Pressure < 30cm H20 -Target driving pressure less than 15 cm of water -Target PaO2 55-65: titrate PEEP/FiO2 per protocol -Ventilator associated pneumonia prevention protocol -Continue empiric antibiotics Was not able to wean this morning  Severe sepsis due to community-acquired pneumonia Lactic acidosis Urine Streptococcus Legionella negative Day 4 of ceftriaxone doxycycline discontinued 8/22  Kidney disease stage IV -Continue to follow renal function -Avoid nephrotoxic -Maintain renal perfusion  Baseline dysarthria -Continue to monitor  Hyperglycemia -Continue SSI  Chronic anemia -Continue to monitor -Transfuse per protocol  History of coronary artery disease -Continue statin -Hold home antihypertensives  History of GERD -Continue PPI  History of pancreatic insufficiency due to chronic pancreatitis -RD following closely - difficult to push creon via tube - will hold for now and use low fat TF, monitor closely Place coretrak   -Continue Efexor -Remeron added-was on  this at home -Will continue to wean as tolerated  Best Practice (right click and "Reselect all SmartList Selections" daily)   Diet/type: tubefeeds DVT prophylaxis: prophylactic heparin  GI prophylaxis: PPI Lines: N/A Foley:  N/A Code Status:  full code Last date of multidisciplinary goals of care discussion: Pending  The patient is critically ill with multiple organ systems failure and requires high complexity decision making for assessment and support, frequent evaluation and titration of therapies, application of advanced monitoring technologies and extensive interpretation of multiple databases. Critical Care Time devoted to patient care services described in this note independent of APP/resident time (if applicable)  is 33 minutes.   Virl Diamond MD [place] [facility] Personal pager: See Amion If unanswered, please page CCM On-call: #[phone]

## 2023-06-08 DIAGNOSIS — J9601 Acute respiratory failure with hypoxia: Secondary | ICD-10-CM | POA: Diagnosis not present

## 2023-06-08 LAB — GLUCOSE, CAPILLARY
Glucose-Capillary: 152 mg/dL — ABNORMAL HIGH (ref 70–99)
Glucose-Capillary: 154 mg/dL — ABNORMAL HIGH (ref 70–99)
Glucose-Capillary: 177 mg/dL — ABNORMAL HIGH (ref 70–99)
Glucose-Capillary: 184 mg/dL — ABNORMAL HIGH (ref 70–99)
Glucose-Capillary: 204 mg/dL — ABNORMAL HIGH (ref 70–99)
Glucose-Capillary: 206 mg/dL — ABNORMAL HIGH (ref 70–99)
Glucose-Capillary: 258 mg/dL — ABNORMAL HIGH (ref 70–99)

## 2023-06-08 LAB — MAGNESIUM: Magnesium: 2.1 mg/dL (ref 1.7–2.4)

## 2023-06-08 LAB — PHOSPHORUS: Phosphorus: 2.8 mg/dL (ref 2.5–4.6)

## 2023-06-08 MED ORDER — INSULIN ASPART 100 UNIT/ML IJ SOLN
8.0000 [IU] | INTRAMUSCULAR | Status: DC
  Administered 2023-06-08 – 2023-06-09 (×5): 8 [IU] via SUBCUTANEOUS

## 2023-06-08 MED ORDER — VENLAFAXINE HCL 75 MG PO TABS
75.0000 mg | ORAL_TABLET | Freq: Two times a day (BID) | ORAL | Status: DC
  Administered 2023-06-08 – 2023-06-09 (×3): 75 mg
  Filled 2023-06-08 (×4): qty 1

## 2023-06-08 MED ORDER — PROPOFOL 1000 MG/100ML IV EMUL
5.0000 ug/kg/min | INTRAVENOUS | Status: DC
  Administered 2023-06-09: 30 ug/kg/min via INTRAVENOUS
  Administered 2023-06-09: 10 ug/kg/min via INTRAVENOUS
  Administered 2023-06-09 (×2): 25 ug/kg/min via INTRAVENOUS
  Administered 2023-06-09 – 2023-06-10 (×2): 20 ug/kg/min via INTRAVENOUS
  Administered 2023-06-10: 10 ug/kg/min via INTRAVENOUS
  Administered 2023-06-10: 20 ug/kg/min via INTRAVENOUS
  Filled 2023-06-08 (×8): qty 100

## 2023-06-08 MED ORDER — PROPOFOL 1000 MG/100ML IV EMUL
INTRAVENOUS | Status: AC
  Administered 2023-06-08: 10 ug/kg/min via INTRAVENOUS
  Filled 2023-06-08: qty 100

## 2023-06-08 MED ORDER — METHYLPREDNISOLONE SODIUM SUCC 40 MG IJ SOLR
40.0000 mg | INTRAMUSCULAR | Status: AC
  Administered 2023-06-09: 40 mg via INTRAVENOUS
  Filled 2023-06-08: qty 1

## 2023-06-08 NOTE — Progress Notes (Signed)
Agitation not resolving despite fentanyl, Precedex, Versed  Add propofol

## 2023-06-08 NOTE — Progress Notes (Signed)
NAME:  [name], MRN:  [id number], DOB:  December 01, 1945, LOS: 4 ADMISSION DATE:  06/04/2023, CONSULTATION DATE:  06/04/23 REFERRING MD:  Ray-ED, CHIEF COMPLAINT:  hypoxia   History of Present Illness:  Mrs. Masloski is a 77 year old woman with a history of recurrent pneumonias,, vitamin 20 B12 deficiency, stroke, seizures, hypertension, GERD, diabetes, DVT who presented to the hospital with shortness of breath and cough.  She has been admitted 4 times previously this year.  She woke up from sleep and respiratory distress.  With EMS she had oxygen saturations in the 70s on room air and despite nonrebreather only rose to mid 80s.  EMS administered DuoNebs, Solu-Medrol, magnesium.  She was placed on BiPAP in the emergency department.  She was administered vancomycin and cefepime after 1 blood culture was collected.  She failed to significantly improve on BiPAP.  She is not on inhalers at home.  Pertinent  Medical History  Hypertension Diabetes Seizures GERD B12 deficiency Stroke Sleep apnea Chronic pancreatitis CAD Colostomy  Significant Hospital Events: Including procedures, antibiotic start and stop dates in addition to other pertinent events   8/21 admitted, intubated 8/22 Remains on vent with some issues with agitation, added Precedex drip  8/24 agitated on trying to reduce sedation 8/25-agitation on reducing sedation   Interim History / Subjective:  Still getting agitated when sedation agrees to use On Precedex, fentanyl Bradycardic at rest but with stimulation heart rate goes into the 120s  Objective   Blood pressure 138/60, pulse (!) 52, temperature 97.7 F (36.5 C), temperature source Oral, resp. rate (!) 30, height 5\' 3"  (1.6 m), weight 85.3 kg, SpO2 100%.    Vent Mode: PRVC FiO2 (%):  [40 %] 40 % Set Rate:  [26 bmp] 26 bmp Vt Set:  [360 mL] 360 mL PEEP:  [8 cmH20] 8 cmH20 Plateau Pressure:  [7 cmH20-21 cmH20] 7 cmH20   Intake/Output Summary (Last 24 hours) at  06/08/2023 1048 Last data filed at 06/08/2023 0800 Gross per 24 hour  Intake 2207.93 ml  Output 1350 ml  Net 857.93 ml   Filed Weights   06/06/23 0500 06/07/23 0500 06/08/23 0500  Weight: 85.4 kg 85.3 kg 85.3 kg    Examination: General: Acute on chronically ill-appearing HEENT: Endotracheal tube in place, moist oral mucosa Neuro: Sedated, unable to focus, spontaneously moving upper extremities CV: S1-S2 appreciated, no murmur PULM: Few basal rales, clear anteriorly GI: Soft, bowel sounds appreciated Extremities: warm/dry, no edema  Skin: no rashes or lesions  I reviewed nursing notes, Consultant notes, hospitalist notes, last 24 h vitals and pain scores, last 48 h intake and output, last 24 h labs and trends, and last 24 h imaging results.  Resolved Hospital Problem list     Assessment & Plan:   Acute respiratory failure with hypoxia Right lower lobe and middle lobe pneumonia -Continue mechanical ventilation -Target TVol 6-8cc/kgIBW -Target Plateau Pressure < 30cm H20 -Target driving pressure less than 15 cm of water -Target PaO2 55-65: titrate PEEP/FiO2 per protocol -Ventilator associated pneumonia prevention protocol -On ceftriaxone-day 6 of 7 of antibiotics  Sepsis due to community-acquired pneumonia -Day 6/7 of antibiotics  Stage IV chronic kidney disease -Continue to follow renal function -Avoid nephrotoxic medications -Maintain renal perfusion  Hyperglycemia -Continue SSI  Chronic anemia -Continue to monitor -Transfuse per protocol  History of coronary artery disease -Continue statin -Hold home antihypertensives  History of GERD -PPI  Gets very agitated with stimulation -Will increase Effexor to 75 twice daily -Will continue to  wean as tolerated  She is not ready for extubation  History of pancreatic insufficiency due to chronic pancreatitis -Unable to push Creon via tube   Best Practice (right click and "Reselect all SmartList Selections"  daily)   Diet/type: tubefeeds DVT prophylaxis: prophylactic heparin  GI prophylaxis: PPI Lines: N/A Foley:  N/A Code Status:  full code Last date of multidisciplinary goals of care discussion: Pending  The patient is critically ill with multiple organ systems failure and requires high complexity decision making for assessment and support, frequent evaluation and titration of therapies, application of advanced monitoring technologies and extensive interpretation of multiple databases. Critical Care Time devoted to patient care services described in this note independent of APP/resident time (if applicable)  is 33 minutes.   Virl Diamond MD [place] [facility] Critical [facility] Personal pager: See Amion If unanswered, please page CCM On-call: [phone]

## 2023-06-08 NOTE — Progress Notes (Signed)
Pt failed SBT; returned to prior settings

## 2023-06-09 ENCOUNTER — Inpatient Hospital Stay (HOSPITAL_COMMUNITY): Payer: Medicare HMO

## 2023-06-09 DIAGNOSIS — Z4682 Encounter for fitting and adjustment of non-vascular catheter: Secondary | ICD-10-CM | POA: Diagnosis not present

## 2023-06-09 DIAGNOSIS — J90 Pleural effusion, not elsewhere classified: Secondary | ICD-10-CM | POA: Diagnosis not present

## 2023-06-09 DIAGNOSIS — J181 Lobar pneumonia, unspecified organism: Secondary | ICD-10-CM | POA: Diagnosis not present

## 2023-06-09 DIAGNOSIS — N184 Chronic kidney disease, stage 4 (severe): Secondary | ICD-10-CM | POA: Diagnosis not present

## 2023-06-09 DIAGNOSIS — G9341 Metabolic encephalopathy: Secondary | ICD-10-CM | POA: Diagnosis not present

## 2023-06-09 DIAGNOSIS — J9690 Respiratory failure, unspecified, unspecified whether with hypoxia or hypercapnia: Secondary | ICD-10-CM | POA: Diagnosis not present

## 2023-06-09 DIAGNOSIS — J9601 Acute respiratory failure with hypoxia: Secondary | ICD-10-CM | POA: Diagnosis not present

## 2023-06-09 DIAGNOSIS — Z9911 Dependence on respirator [ventilator] status: Secondary | ICD-10-CM | POA: Diagnosis not present

## 2023-06-09 DIAGNOSIS — R918 Other nonspecific abnormal finding of lung field: Secondary | ICD-10-CM | POA: Diagnosis not present

## 2023-06-09 DIAGNOSIS — N179 Acute kidney failure, unspecified: Secondary | ICD-10-CM

## 2023-06-09 LAB — POCT I-STAT 7, (LYTES, BLD GAS, ICA,H+H)
Acid-base deficit: 6 mmol/L — ABNORMAL HIGH (ref 0.0–2.0)
Acid-base deficit: 6 mmol/L — ABNORMAL HIGH (ref 0.0–2.0)
Bicarbonate: 22.1 mmol/L (ref 20.0–28.0)
Bicarbonate: 20.9 mmol/L (ref 20.0–28.0)
Calcium, Ion: 1.37 mmol/L (ref 1.15–1.40)
Calcium, Ion: 1.38 mmol/L (ref 1.15–1.40)
HCT: 30 % — ABNORMAL LOW (ref 36.0–46.0)
HCT: 32 % — ABNORMAL LOW (ref 36.0–46.0)
Hemoglobin: 10.2 g/dL — ABNORMAL LOW (ref 12.0–15.0)
Hemoglobin: 10.9 g/dL — ABNORMAL LOW (ref 12.0–15.0)
O2 Saturation: 66 %
O2 Saturation: 96 %
Patient temperature: 97.8
Patient temperature: 97.5
Potassium: 4.5 mmol/L (ref 3.5–5.1)
Potassium: 4.5 mmol/L (ref 3.5–5.1)
Sodium: 133 mmol/L — ABNORMAL LOW (ref 135–145)
Sodium: 133 mmol/L — ABNORMAL LOW (ref 135–145)
TCO2: 24 mmol/L (ref 22–32)
TCO2: 22 mmol/L (ref 22–32)
pCO2 arterial: 56 mmHg — ABNORMAL HIGH (ref 32–48)
pCO2 arterial: 45.5 mmHg (ref 32–48)
pH, Arterial: 7.202 — ABNORMAL LOW (ref 7.35–7.45)
pH, Arterial: 7.267 — ABNORMAL LOW (ref 7.35–7.45)
pO2, Arterial: 41 mmHg — ABNORMAL LOW (ref 83–108)
pO2, Arterial: 90 mmHg (ref 83–108)

## 2023-06-09 LAB — CBC WITH DIFFERENTIAL/PLATELET
Abs Immature Granulocytes: 1.13 10*3/uL — ABNORMAL HIGH (ref 0.00–0.07)
Basophils Absolute: 0.1 10*3/uL (ref 0.0–0.1)
Basophils Relative: 0 %
Eosinophils Absolute: 0.4 10*3/uL (ref 0.0–0.5)
Eosinophils Relative: 2 %
HCT: 26.7 % — ABNORMAL LOW (ref 36.0–46.0)
Hemoglobin: 8.2 g/dL — ABNORMAL LOW (ref 12.0–15.0)
Immature Granulocytes: 5 %
Lymphocytes Relative: 9 %
Lymphs Abs: 2 10*3/uL (ref 0.7–4.0)
MCH: 28.9 pg (ref 26.0–34.0)
MCHC: 30.7 g/dL (ref 30.0–36.0)
MCV: 94 fL (ref 80.0–100.0)
Monocytes Absolute: 1.8 10*3/uL — ABNORMAL HIGH (ref 0.1–1.0)
Monocytes Relative: 8 %
Neutro Abs: 16.7 10*3/uL — ABNORMAL HIGH (ref 1.7–7.7)
Neutrophils Relative %: 76 %
Platelets: 187 10*3/uL (ref 150–400)
RBC: 2.84 MIL/uL — ABNORMAL LOW (ref 3.87–5.11)
RDW: 13.6 % (ref 11.5–15.5)
WBC: 22 10*3/uL — ABNORMAL HIGH (ref 4.0–10.5)
nRBC: 0.2 % (ref 0.0–0.2)

## 2023-06-09 LAB — BASIC METABOLIC PANEL
Anion gap: 12 (ref 5–15)
BUN: 77 mg/dL — ABNORMAL HIGH (ref 8–23)
CO2: 20 mmol/L — ABNORMAL LOW (ref 22–32)
Calcium: 9 mg/dL (ref 8.9–10.3)
Chloride: 103 mmol/L (ref 98–111)
Creatinine, Ser: 1.97 mg/dL — ABNORMAL HIGH (ref 0.44–1.00)
GFR, Estimated: 26 mL/min — ABNORMAL LOW (ref >60)
Glucose, Bld: 117 mg/dL — ABNORMAL HIGH (ref 70–99)
Potassium: 4.7 mmol/L (ref 3.5–5.1)
Sodium: 135 mmol/L (ref 135–145)

## 2023-06-09 LAB — CULTURE, BLOOD (SINGLE)
Culture: NO GROWTH
Culture: NO GROWTH
Special Requests: ADEQUATE
Special Requests: ADEQUATE

## 2023-06-09 LAB — GLUCOSE, CAPILLARY
Glucose-Capillary: 104 mg/dL — ABNORMAL HIGH (ref 70–99)
Glucose-Capillary: 115 mg/dL — ABNORMAL HIGH (ref 70–99)
Glucose-Capillary: 121 mg/dL — ABNORMAL HIGH (ref 70–99)
Glucose-Capillary: 128 mg/dL — ABNORMAL HIGH (ref 70–99)
Glucose-Capillary: 161 mg/dL — ABNORMAL HIGH (ref 70–99)
Glucose-Capillary: 91 mg/dL (ref 70–99)

## 2023-06-09 MED ORDER — BANATROL TF EN LIQD
60.0000 mL | Freq: Two times a day (BID) | ENTERAL | Status: DC
  Administered 2023-06-09 – 2023-06-16 (×15): 60 mL
  Filled 2023-06-09 (×15): qty 60

## 2023-06-09 MED ORDER — METOPROLOL TARTRATE 12.5 MG HALF TABLET
12.5000 mg | ORAL_TABLET | Freq: Two times a day (BID) | ORAL | Status: DC
  Administered 2023-06-09: 12.5 mg
  Filled 2023-06-09: qty 1

## 2023-06-09 MED ORDER — FUROSEMIDE 10 MG/ML IJ SOLN: 40.0000 mg | Freq: Once | INTRAMUSCULAR | Status: DC

## 2023-06-09 MED ORDER — METOPROLOL TARTRATE 25 MG PO TABS
25.0000 mg | ORAL_TABLET | Freq: Two times a day (BID) | ORAL | Status: DC
  Administered 2023-06-09: 25 mg
  Filled 2023-06-09: qty 1

## 2023-06-09 MED ORDER — FUROSEMIDE 10 MG/ML IJ SOLN
60.0000 mg | Freq: Once | INTRAMUSCULAR | Status: AC
  Administered 2023-06-09: 60 mg via INTRAVENOUS
  Filled 2023-06-09: qty 6

## 2023-06-09 NOTE — Progress Notes (Addendum)
NAME:  [name], MRN:  [id number], DOB:  1946-04-19, LOS: 5 ADMISSION DATE:  06/04/2023, CONSULTATION DATE:  06/04/23 REFERRING MD:  Ray-ED, CHIEF COMPLAINT:  hypoxia   History of Present Illness:  [name] is a 77 year old woman with a history of recurrent pneumonias,, vitamin 20 B12 deficiency, stroke, seizures, hypertension, GERD, diabetes, DVT who presented to the hospital with shortness of breath and cough.  She has been admitted 4 times previously this year.  She woke up from sleep and respiratory distress.  With EMS she had oxygen saturations in the 70s on room air and despite nonrebreather only rose to mid 80s.  EMS administered DuoNebs, Solu-Medrol, magnesium.  She was placed on BiPAP in the emergency department.  She was administered vancomycin and cefepime after 1 blood culture was collected.  She failed to significantly improve on BiPAP.  She is not on inhalers at home.  Pertinent  Medical History  Hypertension Diabetes Seizures GERD B12 deficiency Stroke Sleep apnea Chronic pancreatitis CAD Colostomy  Significant Hospital Events: Including procedures, antibiotic start and stop dates in addition to other pertinent events   8/21 admitted, intubated 8/22 Remains on vent with some issues with agitation, added Precedex drip  8/24 agitated on trying to reduce sedation 8/25-agitation on reducing sedation 8/26 ABG with hypercapnia and hypercarbia, fentanyl increased and propofol maintained for better sedation , very dyssynchronous to vent    Interim History / Subjective:  Still getting agitated despite increase in sedation  On precedex at 30 and Fentanyl at 200 and remains dyssynchronous to the vent.  Difficult to suction, will get CXR and ensure ETT is not kinked , is in correct position ( Last CXR 8/23)  Objective   Blood pressure (!) 96/43, pulse 70, temperature (!) 97.5 F (36.4 C), temperature source Axillary, resp. rate 15, height 5\' 3"  (1.6 m), weight 86 kg,  SpO2 100%.    Vent Mode: PRVC FiO2 (%):  [40 %-50 %] 40 % Set Rate:  [20 bmp-26 bmp] 26 bmp Vt Set:  [360 mL] 360 mL PEEP:  [5 cmH20-8 cmH20] 5 cmH20 Pressure Support:  [3 cmH20] 3 cmH20 Plateau Pressure:  [12 cmH20-17 cmH20] 12 cmH20   Intake/Output Summary (Last 24 hours) at 06/09/2023 0914 Last data filed at 06/09/2023 0600 Gross per 24 hour  Intake 2459.76 ml  Output 970 ml  Net 1489.76 ml   Filed Weights   06/07/23 0500 06/08/23 0500 06/09/23 0352  Weight: 85.3 kg 85.3 kg 86 kg    Examination: General: Acute on chronically ill-appearing, agitated despite increase in sedation  HEENT: Endotracheal tube in place, moist oral mucosa, Cot Track Neuro: Sedated, unable to focus, spontaneously moving upper extremities, not following commands 8/26 am CV: S1-S2 appreciated, no murmur, tachy per tele at 148, sinus PULM: Bilateral chest excursion, rhonchi R>L, some bleed tinged secretions per nursing, difficult to suction  GI: Soft, , NT, Slightly distended, Flexiseal with high output Extremities: warm/dry, no edema, legs are stiff with turns   Skin: no rashes or lesions  T Max 98.6 Net + 15 L   No Labs with exception of ABG and ISTAT  Na 133, K 4.5, HGB 10.2  ABG 7.20/56/41/22/24>> very agitated and dyssynchronous with vent   Resolved Hospital Problem list     Assessment & Plan:   Acute respiratory failure with hypoxia Right lower lobe and middle lobe pneumonia Severe agitation and vent dyssynchrony -Continue mechanical ventilation -Target TVol 6-8cc/kgIBW -Target Plateau Pressure < 30cm H20 -Target driving pressure  less than 15 cm of water -Target PaO2 55-65: titrate PEEP/FiO2 per protocol -Ventilator associated pneumonia prevention protocol -On ceftriaxone-day 7 of 7 of antibiotics - CXR now to ensure ETT is in place  -Sepsis due to community-acquired pneumonia -Day 7/7 of antibiotics - Trend fever curve and WBC - Follow cultures ( BC 8/21> NGTD)  Stage IV  chronic kidney disease -Continue to follow renal function -Avoid nephrotoxic medications -Maintain renal perfusion - Lasix 60 mg today for net + 15 L  Hyperglycemia -Continue SSI - CBG's  Chronic anemia -Continue to monitor - Trend CBC -Transfuse per protocol  History of coronary artery disease -Continue statin -Hold home antihypertensives  History of GERD -PPI  Gets very agitated  and tachycardic with stimulation -Will increase Effexor to 75 twice daily -Will continue to wean as tolerated - Will add lopressor 12.5 mg BID, hold for HR < 50  She is not ready for extubation , but continue to try CPAP PS as CXR is improving  History of pancreatic insufficiency due to chronic pancreatitis -Unable to push Creon via tube  Metabolic acidosis , will review labs once available to evaluate for possible cause. She has had significant stools, ? If could be related to her bicarb loss with diarrhea.   Will consider Lasix 40 once renal labs have been reviewed.    Best Practice (right click and "Reselect all SmartList Selections" daily)   Diet/type: tubefeeds DVT prophylaxis: prophylactic heparin  GI prophylaxis: PPI Lines: N/A Foley:  N/A Code Status:  full code Last date of multidisciplinary goals of care discussion: Pending  APP Time 40 minutes Bevelyn Ngo, MSN, AGACNP-BC [facility] See Amion for personal pager PCCM on call pager [phone]  06/09/2023 10:20 AM

## 2023-06-09 NOTE — Progress Notes (Cosign Needed)
Labs drawn this afternoon reviewed WBC 22, ( from 34.8) HGB 8.2, ( 10.9),platelets 187 Suspect drop in HGB is related to hemodilution as + 15 L  Na 135/ K 4.7/ Cl 103/CO2 20/ BUN 77/ Creatinine 1.97 ( 1.71) No new orders

## 2023-06-09 NOTE — TOC CM/SW Note (Signed)
Transition of Care [facility]) - Inpatient Brief Assessment   Patient Details  Name: [name] MRN: [id number] Date of Birth: Feb 20, 1946  Transition of Care [facility]) CM/SW Contact:    Tom-Johnson, Hershal Coria, RN Phone Number: 06/09/2023, 3:22 PM   Clinical Narrative:  Patient continues to be intubated and sedated.   No TOC needs or recommendations noted at this time.  Patient not Medically ready for discharge.  CM will continue to follow as patient progresses with care towards discharge.      Transition of Care Asessment: Insurance and Status: Insurance coverage has been reviewed Patient has primary care physician: Yes Home environment has been reviewed: Yes Prior level of function:: Independent Prior/Current Home Services: No current home services Social Determinants of Health Reivew: SDOH reviewed no interventions necessary Readmission risk has been reviewed: Yes Transition of care needs: transition of care needs identified, TOC will continue to follow

## 2023-06-09 NOTE — Progress Notes (Addendum)
eLink Physician-Brief Progress Note Patient Name: PENI MCELDOWNEY DOB: 21-Jul-1946 MRN: [id number]   Date of Service  06/09/2023  HPI/Events of Note  77 year old woman with a history of recurrent pneumonias,, vitamin 20 B12 deficiency, stroke, seizures, hypertension, GERD, diabetes, DVT who presented to the hospital with shortness of breath and cough.  Ongoing high stool output. Previously had a flexiseal in place but removed 8/24.  eICU Interventions  Replace Flexiseal.    0537 -morning ABG reviewed, shows hypercapnia and hypoxia.  Patient appears to be dyssynchronous with the ventilator and breath-holding with significant agitation and painful stimulus.  Has titrated off of Precedex due to the bradycardia.  Been using propofol with stable doses.  Stable doses of fentanyl use throughout the night.  Will escalate the fentanyl and maintain propofol.  Would recheck ABG after vent synchrony is achieved.  Intervention Category Minor Interventions: Routine modifications to care plan (e.g. PRN medications for pain, fever)  Iana Buzan 06/09/2023, 12:33 AM

## 2023-06-10 ENCOUNTER — Ambulatory Visit: Payer: Medicare HMO | Admitting: Nurse Practitioner

## 2023-06-10 DIAGNOSIS — J9601 Acute respiratory failure with hypoxia: Secondary | ICD-10-CM | POA: Diagnosis not present

## 2023-06-10 DIAGNOSIS — Z9911 Dependence on respirator [ventilator] status: Secondary | ICD-10-CM | POA: Diagnosis not present

## 2023-06-10 DIAGNOSIS — J181 Lobar pneumonia, unspecified organism: Secondary | ICD-10-CM

## 2023-06-10 DIAGNOSIS — N179 Acute kidney failure, unspecified: Secondary | ICD-10-CM | POA: Diagnosis not present

## 2023-06-10 DIAGNOSIS — G9341 Metabolic encephalopathy: Secondary | ICD-10-CM | POA: Diagnosis not present

## 2023-06-10 DIAGNOSIS — N184 Chronic kidney disease, stage 4 (severe): Secondary | ICD-10-CM | POA: Diagnosis not present

## 2023-06-10 LAB — BASIC METABOLIC PANEL
Anion gap: 15 (ref 5–15)
BUN: 83 mg/dL — ABNORMAL HIGH (ref 8–23)
CO2: 20 mmol/L — ABNORMAL LOW (ref 22–32)
Calcium: 9.2 mg/dL (ref 8.9–10.3)
Chloride: 98 mmol/L (ref 98–111)
Creatinine, Ser: 1.86 mg/dL — ABNORMAL HIGH (ref 0.44–1.00)
GFR, Estimated: 28 mL/min — ABNORMAL LOW (ref >60)
Glucose, Bld: 268 mg/dL — ABNORMAL HIGH (ref 70–99)
Potassium: 4.6 mmol/L (ref 3.5–5.1)
Sodium: 133 mmol/L — ABNORMAL LOW (ref 135–145)

## 2023-06-10 LAB — CBC WITH DIFFERENTIAL/PLATELET
Abs Immature Granulocytes: 0 10*3/uL (ref 0.00–0.07)
Basophils Absolute: 0 10*3/uL (ref 0.0–0.1)
Basophils Relative: 0 %
Eosinophils Absolute: 0.6 10*3/uL — ABNORMAL HIGH (ref 0.0–0.5)
Eosinophils Relative: 2 %
HCT: 29.9 % — ABNORMAL LOW (ref 36.0–46.0)
Hemoglobin: 9.2 g/dL — ABNORMAL LOW (ref 12.0–15.0)
Lymphocytes Relative: 4 %
Lymphs Abs: 1.1 10*3/uL (ref 0.7–4.0)
MCH: 28.5 pg (ref 26.0–34.0)
MCHC: 30.8 g/dL (ref 30.0–36.0)
MCV: 92.6 fL (ref 80.0–100.0)
Monocytes Absolute: 0.6 10*3/uL (ref 0.1–1.0)
Monocytes Relative: 2 %
Neutro Abs: 25.3 10*3/uL — ABNORMAL HIGH (ref 1.7–7.7)
Neutrophils Relative %: 92 %
Platelets: 234 10*3/uL (ref 150–400)
RBC: 3.23 MIL/uL — ABNORMAL LOW (ref 3.87–5.11)
RDW: 13.6 % (ref 11.5–15.5)
WBC: 27.5 10*3/uL — ABNORMAL HIGH (ref 4.0–10.5)
nRBC: 0 /100{WBCs}
nRBC: 0.2 % (ref 0.0–0.2)

## 2023-06-10 LAB — POCT I-STAT 7, (LYTES, BLD GAS, ICA,H+H)
Acid-base deficit: 6 mmol/L — ABNORMAL HIGH (ref 0.0–2.0)
Bicarbonate: 20.2 mmol/L (ref 20.0–28.0)
Calcium, Ion: 1.31 mmol/L (ref 1.15–1.40)
HCT: 25 % — ABNORMAL LOW (ref 36.0–46.0)
Hemoglobin: 8.5 g/dL — ABNORMAL LOW (ref 12.0–15.0)
O2 Saturation: 93 %
Potassium: 4.6 mmol/L (ref 3.5–5.1)
Sodium: 133 mmol/L — ABNORMAL LOW (ref 135–145)
TCO2: 21 mmol/L — ABNORMAL LOW (ref 22–32)
pCO2 arterial: 43.6 mmHg (ref 32–48)
pH, Arterial: 7.273 — ABNORMAL LOW (ref 7.35–7.45)
pO2, Arterial: 78 mmHg — ABNORMAL LOW (ref 83–108)

## 2023-06-10 LAB — GLUCOSE, CAPILLARY
Glucose-Capillary: 191 mg/dL — ABNORMAL HIGH (ref 70–99)
Glucose-Capillary: 222 mg/dL — ABNORMAL HIGH (ref 70–99)
Glucose-Capillary: 257 mg/dL — ABNORMAL HIGH (ref 70–99)
Glucose-Capillary: 198 mg/dL — ABNORMAL HIGH (ref 70–99)
Glucose-Capillary: 164 mg/dL — ABNORMAL HIGH (ref 70–99)
Glucose-Capillary: 171 mg/dL — ABNORMAL HIGH (ref 70–99)

## 2023-06-10 MED ORDER — INSULIN ASPART 100 UNIT/ML IJ SOLN
4.0000 [IU] | INTRAMUSCULAR | Status: DC
  Administered 2023-06-10 – 2023-06-12 (×11): 4 [IU] via SUBCUTANEOUS

## 2023-06-10 MED ORDER — VENLAFAXINE HCL 37.5 MG PO TABS
37.5000 mg | ORAL_TABLET | Freq: Two times a day (BID) | ORAL | Status: DC
  Administered 2023-06-10: 37.5 mg
  Filled 2023-06-10 (×2): qty 1

## 2023-06-10 MED ORDER — HYDRALAZINE HCL 20 MG/ML IJ SOLN
INTRAMUSCULAR | Status: AC
  Filled 2023-06-10: qty 1

## 2023-06-10 MED ORDER — HYDRALAZINE HCL 20 MG/ML IJ SOLN
10.0000 mg | INTRAMUSCULAR | Status: DC | PRN
  Administered 2023-06-10 – 2023-06-13 (×3): 10 mg via INTRAVENOUS
  Filled 2023-06-10 (×3): qty 1

## 2023-06-10 MED ORDER — METOPROLOL TARTRATE 5 MG/5ML IV SOLN
2.5000 mg | INTRAVENOUS | Status: DC | PRN
  Administered 2023-06-10 – 2023-06-12 (×4): 5 mg via INTRAVENOUS
  Filled 2023-06-10 (×5): qty 5

## 2023-06-10 MED ORDER — FENTANYL CITRATE PF 50 MCG/ML IJ SOSY
25.0000 ug | PREFILLED_SYRINGE | INTRAMUSCULAR | Status: DC | PRN
  Administered 2023-06-10 – 2023-06-11 (×5): 100 ug via INTRAVENOUS
  Filled 2023-06-10 (×4): qty 2

## 2023-06-10 MED ORDER — CLONIDINE HCL 0.1 MG PO TABS
0.1000 mg | ORAL_TABLET | Freq: Three times a day (TID) | ORAL | Status: DC
  Administered 2023-06-10 – 2023-06-11 (×2): 0.1 mg
  Filled 2023-06-10 (×2): qty 1

## 2023-06-10 MED ORDER — METOPROLOL TARTRATE 5 MG/5ML IV SOLN
2.5000 mg | INTRAVENOUS | Status: DC | PRN
  Administered 2023-06-10: 2.5 mg via INTRAVENOUS
  Filled 2023-06-10: qty 5

## 2023-06-10 MED ORDER — METOPROLOL TARTRATE 50 MG PO TABS
50.0000 mg | ORAL_TABLET | Freq: Two times a day (BID) | ORAL | Status: DC
  Administered 2023-06-10 (×2): 50 mg
  Filled 2023-06-10 (×2): qty 1

## 2023-06-10 MED ORDER — SODIUM BICARBONATE 650 MG PO TABS
1300.0000 mg | ORAL_TABLET | Freq: Two times a day (BID) | ORAL | Status: DC
  Administered 2023-06-10 – 2023-06-12 (×5): 1300 mg
  Filled 2023-06-10 (×5): qty 2

## 2023-06-10 MED ORDER — FUROSEMIDE 10 MG/ML IJ SOLN
60.0000 mg | Freq: Once | INTRAMUSCULAR | Status: AC
  Administered 2023-06-10: 60 mg via INTRAVENOUS
  Filled 2023-06-10: qty 6

## 2023-06-10 MED ORDER — CLONIDINE HCL 0.1 MG PO TABS
0.1000 mg | ORAL_TABLET | Freq: Two times a day (BID) | ORAL | Status: DC
  Administered 2023-06-10: 0.1 mg
  Filled 2023-06-10: qty 1

## 2023-06-10 MED ORDER — PANTOPRAZOLE SODIUM 40 MG IV SOLR
40.0000 mg | Freq: Two times a day (BID) | INTRAVENOUS | Status: DC
  Administered 2023-06-10 – 2023-06-17 (×15): 40 mg via INTRAVENOUS
  Filled 2023-06-10 (×15): qty 10

## 2023-06-10 MED ORDER — HYDRALAZINE HCL 20 MG/ML IJ SOLN
10.0000 mg | Freq: Once | INTRAMUSCULAR | Status: AC
  Administered 2023-06-10: 10 mg via INTRAVENOUS

## 2023-06-10 MED ORDER — METOPROLOL TARTRATE 5 MG/5ML IV SOLN
INTRAVENOUS | Status: AC
  Filled 2023-06-10: qty 5

## 2023-06-10 NOTE — Progress Notes (Addendum)
NAME:  [name], MRN:  [id number], DOB:  05/13/1946, LOS: 6 ADMISSION DATE:  06/04/2023, CONSULTATION DATE:  06/04/23 REFERRING MD:  Ray-ED, CHIEF COMPLAINT:  hypoxia   History of Present Illness:  [name] is a 77 year old woman with a history of recurrent pneumonias,, vitamin 20 B12 deficiency, stroke, seizures, hypertension, GERD, diabetes, DVT who presented to the hospital with shortness of breath and cough.  She has been admitted 4 times previously this year.  She woke up from sleep and respiratory distress.  With EMS she had oxygen saturations in the 70s on room air and despite nonrebreather only rose to mid 80s.  EMS administered DuoNebs, Solu-Medrol, magnesium.  She was placed on BiPAP in the emergency department.  She was administered vancomycin and cefepime after 1 blood culture was collected.  She failed to significantly improve on BiPAP.  She is not on inhalers at home.  Pertinent  Medical History  Hypertension Diabetes Seizures GERD B12 deficiency Stroke Sleep apnea Chronic pancreatitis CAD Colostomy  Significant Hospital Events: Including procedures, antibiotic start and stop dates in addition to other pertinent events   8/21 admitted, intubated 8/22 Remains on vent with some issues with agitation, added Precedex drip  8/24 agitated on trying to reduce sedation 8/25-agitation on reducing sedation 8/26 ABG with hypercapnia and hypercarbia, fentanyl increased and propofol maintained for better sedation , very dyssynchronous to vent.  Switched from precedex to propofol   Interim History / Subjective:  Boluses overnight for vent dyssynchrony/ HTN/ tachycardia but  otherwise remains minimally responsive, not f/c Off fentanyl this am, looks better on PSV rather than PRVC/ PCV  Objective   Blood pressure (!) 153/56, pulse (!) 119, temperature 99 F (37.2 C), temperature source Axillary, resp. rate (!) 30, height 5\' 3"  (1.6 m), weight 89.7 kg, SpO2 100%.    Vent  Mode: PRVC FiO2 (%):  [40 %] 40 % Set Rate:  [26 bmp-28 bmp] 28 bmp Vt Set:  [360 mL-420 mL] 420 mL PEEP:  [5 cmH20] 5 cmH20 Pressure Support:  [10 cmH20] 10 cmH20 Plateau Pressure:  [15 cmH20-19 cmH20] 16 cmH20   Intake/Output Summary (Last 24 hours) at 06/10/2023 4401 Last data filed at 06/10/2023 0800 Gross per 24 hour  Intake 2911.92 ml  Output 2925 ml  Net -13.08 ml   Filed Weights   06/08/23 0500 06/09/23 0352 06/10/23 0500  Weight: 85.3 kg 86 kg 89.7 kg    Examination: Propofol 20,  fent 200 General:  Critically ill elderly female lying in bed in NAD HEENT: MM pink/moist, poor dentition, ETT, cortrak, pupils 4/reactive, anicteric Neuro:  sedated, eyes open but not track, blinks to threat, grimaces to noxious stimuli in upper extremities, no response in LE, no spont movement CV: rr, ST, no obvious murmur PULM:  MV, coarse throughout, no wheeze, minimal secretions GI: soft, bsx4 active  Extremities: warm/dry, generalized edema  Skin: no rashes or lesions  T Max 99 UOP 2.8L/ 24hrs  Net +15.7 Labs reviewed> Na 133, rising BUN, stable sCr, WBC 22> 27, Hgb trend overall decreasing   Resolved Hospital Problem list     Assessment & Plan:   Acute respiratory failure with hypoxia Right lower lobe and middle lobe pneumonia Severe agitation and vent dyssynchrony Hx OSA - mostly with hemodynamic and vent dyssynchrony today with ?hypodelirium - tolerating PSV best right now, doing well on 5/5 however mental status precludes extubation> would not be able to protect airway - minimize sedation> fent off, low dose propofol and  assess for rapid extubation possibly if mental status improves - prn full MV support, 4-8cc/kg IBW with goal Pplat <30 and DP<15  - VAP prevention protocol/ PPI - intermittent CXR/ ABG - wean FiO2 as able for SpO2 >92%  - completes 7 days of abx today - prn BD  Sepsis due to community-acquired pneumonia - completes day 7 of abx this am - remains  afebrile but WBC up.  No change in secretions - BC and trach asp neg from 8/21 - cont to monitor and trend CBC - Trend fever curve and WBC - Follow cultures ( BC 8/21> NGTD)  Stage IV chronic kidney disease NAGMA - ? Due to diarrhea, but not significant diarrhea today - add enteral bicarb - stable sCr - additional lasix today - trend renal indices  - strict I/Os, daily wts - avoid nephrotoxins, renal dose meds, hemodynamic support as above  Hyperglycemia - s/p solumedrol yest evening, elevated BG now - SSI and CBGs.  Hold on adding levemir as was in 90's yest.  AoChronic anemia - ? Dilutional but need to monitor for ABLA with rising BUN/ downtrending Hgb - change PPI back to home BID - Trend CBC - Transfuse per protocol  History of coronary artery disease - ASA/ statin  HTN - more hypertensive and ST today, ?clonidine w/d - increase metoprolol to 50mg  BID and add prn lopressor  - add back clonidine 0.1mg  BID  History of GERD - PPI  Acute hypoactive delirium with metabolic encephalopathy  Hx of seizures - decrease effexor back to home dose 37.5mg  BID - minimize sedation as tolerated  - cont keppra/ seizure precautions - if mental status remains poor> consider EEG - supportive care - infectious treatment as above - was alert and oriented on admit.  History of pancreatic insufficiency due to chronic pancreatitis Diarrhea  - Unable to push Creon via tube - cont benicar - bowel regimen prn  Best Practice (right click and "Reselect all SmartList Selections" daily)   Diet/type: tubefeeds DVT prophylaxis: prophylactic heparin  GI prophylaxis: PPI Lines: N/A Foley:  N/A Code Status:  full code Last date of multidisciplinary goals of care discussion: Pending  No family at bedside [name] updated by phone 8/27   APP Time 40 minutes   Posey Boyer, MSN, AG-ACNP-BC [facility] 06/10/2023, 8:29 AM  See Amion for pager If no response  to pager , please call [phone] until 7pm After 7:00 pm call Elink  [phone]

## 2023-06-10 NOTE — Progress Notes (Signed)
eLink Physician-Brief Progress Note Patient Name: MARY-JANE HEWELL DOB: 1946-05-21 MRN: [id number]   Date of Service  06/10/2023  HPI/Events of Note  77 year old woman with a history of recurrent pneumonias,, vitamin 20 B12 deficiency, stroke, seizures, hypertension, GERD, diabetes, DVT who presented to the hospital with shortness of breath and cough.   Asked to review ABG.   respiratory rate 28, 8 mL/kg.  ABG consistent with primarily metabolic acidosis in the setting of worsening azotemia.  eICU Interventions  For now, no intervention indicated.  Maintain current ventilatory settings.     Intervention Category Minor Interventions: Clinical assessment - ordering diagnostic tests  Trystyn Dolley 06/10/2023, 4:53 AM

## 2023-06-10 NOTE — Progress Notes (Signed)
Pt switched over to full support settings to rest for the night

## 2023-06-11 DIAGNOSIS — Z9911 Dependence on respirator [ventilator] status: Secondary | ICD-10-CM | POA: Diagnosis not present

## 2023-06-11 DIAGNOSIS — J181 Lobar pneumonia, unspecified organism: Secondary | ICD-10-CM | POA: Diagnosis not present

## 2023-06-11 DIAGNOSIS — J9601 Acute respiratory failure with hypoxia: Secondary | ICD-10-CM | POA: Diagnosis not present

## 2023-06-11 LAB — GLUCOSE, CAPILLARY
Glucose-Capillary: 152 mg/dL — ABNORMAL HIGH (ref 70–99)
Glucose-Capillary: 178 mg/dL — ABNORMAL HIGH (ref 70–99)
Glucose-Capillary: 188 mg/dL — ABNORMAL HIGH (ref 70–99)
Glucose-Capillary: 190 mg/dL — ABNORMAL HIGH (ref 70–99)
Glucose-Capillary: 195 mg/dL — ABNORMAL HIGH (ref 70–99)
Glucose-Capillary: 199 mg/dL — ABNORMAL HIGH (ref 70–99)

## 2023-06-11 LAB — CBC
HCT: 24.9 % — ABNORMAL LOW (ref 36.0–46.0)
Hemoglobin: 8 g/dL — ABNORMAL LOW (ref 12.0–15.0)
MCH: 28.7 pg (ref 26.0–34.0)
MCHC: 32.1 g/dL (ref 30.0–36.0)
MCV: 89.2 fL (ref 80.0–100.0)
Platelets: 217 10*3/uL (ref 150–400)
RBC: 2.79 MIL/uL — ABNORMAL LOW (ref 3.87–5.11)
RDW: 13.8 % (ref 11.5–15.5)
WBC: 18.1 10*3/uL — ABNORMAL HIGH (ref 4.0–10.5)
nRBC: 0.4 % — ABNORMAL HIGH (ref 0.0–0.2)

## 2023-06-11 LAB — TYPE AND SCREEN
ABO/RH(D): A POS
Antibody Screen: NEGATIVE

## 2023-06-11 LAB — BLOOD GAS, VENOUS
Acid-Base Excess: 0.8 mmol/L (ref 0.0–2.0)
Bicarbonate: 26.6 mmol/L (ref 20.0–28.0)
Drawn by: 59174
O2 Saturation: 94.4 %
Patient temperature: 36.7
pCO2, Ven: 45 mmHg (ref 44–60)
pH, Ven: 7.37 (ref 7.25–7.43)
pO2, Ven: 90 mmHg — ABNORMAL HIGH (ref 32–45)

## 2023-06-11 LAB — BASIC METABOLIC PANEL
Anion gap: 12 (ref 5–15)
BUN: 89 mg/dL — ABNORMAL HIGH (ref 8–23)
CO2: 24 mmol/L (ref 22–32)
Calcium: 8.8 mg/dL — ABNORMAL LOW (ref 8.9–10.3)
Chloride: 101 mmol/L (ref 98–111)
Creatinine, Ser: 1.7 mg/dL — ABNORMAL HIGH (ref 0.44–1.00)
GFR, Estimated: 31 mL/min — ABNORMAL LOW (ref >60)
Glucose, Bld: 158 mg/dL — ABNORMAL HIGH (ref 70–99)
Potassium: 4 mmol/L (ref 3.5–5.1)
Sodium: 137 mmol/L (ref 135–145)

## 2023-06-11 LAB — HEMOGLOBIN AND HEMATOCRIT, BLOOD
HCT: 29.1 % — ABNORMAL LOW (ref 36.0–46.0)
Hemoglobin: 9.6 g/dL — ABNORMAL LOW (ref 12.0–15.0)

## 2023-06-11 LAB — PHOSPHORUS: Phosphorus: 4.5 mg/dL (ref 2.5–4.6)

## 2023-06-11 LAB — MAGNESIUM: Magnesium: 1.8 mg/dL (ref 1.7–2.4)

## 2023-06-11 MED ORDER — CLONIDINE HCL 0.2 MG PO TABS
0.2000 mg | ORAL_TABLET | Freq: Three times a day (TID) | ORAL | Status: DC
  Administered 2023-06-11 – 2023-06-13 (×6): 0.2 mg
  Filled 2023-06-11 (×6): qty 1

## 2023-06-11 MED ORDER — FUROSEMIDE 10 MG/ML IJ SOLN
60.0000 mg | Freq: Once | INTRAMUSCULAR | Status: AC
  Administered 2023-06-11: 60 mg via INTRAVENOUS
  Filled 2023-06-11: qty 6

## 2023-06-11 MED ORDER — METOPROLOL TARTRATE 5 MG/5ML IV SOLN
5.0000 mg | Freq: Once | INTRAVENOUS | Status: AC
  Administered 2023-06-11: 5 mg via INTRAVENOUS

## 2023-06-11 MED ORDER — MAGNESIUM SULFATE 2 GM/50ML IV SOLN
2.0000 g | Freq: Once | INTRAVENOUS | Status: AC
  Administered 2023-06-11: 2 g via INTRAVENOUS
  Filled 2023-06-11: qty 50

## 2023-06-11 MED ORDER — ORAL CARE MOUTH RINSE
15.0000 mL | OROMUCOSAL | Status: DC
  Administered 2023-06-11 – 2023-07-03 (×81): 15 mL via OROMUCOSAL

## 2023-06-11 MED ORDER — CLEVIDIPINE BUTYRATE 0.5 MG/ML IV EMUL
0.0000 mg/h | INTRAVENOUS | Status: DC
  Administered 2023-06-11: 2 mg/h via INTRAVENOUS
  Administered 2023-06-11: 3 mg/h via INTRAVENOUS
  Administered 2023-06-12: 2 mg/h via INTRAVENOUS
  Administered 2023-06-12: 6 mg/h via INTRAVENOUS
  Filled 2023-06-11 (×4): qty 100

## 2023-06-11 MED ORDER — POTASSIUM CHLORIDE 20 MEQ PO PACK
20.0000 meq | PACK | Freq: Once | ORAL | Status: AC
  Administered 2023-06-11: 20 meq
  Filled 2023-06-11: qty 1

## 2023-06-11 MED ORDER — ORAL CARE MOUTH RINSE: 15.0000 mL | OROMUCOSAL | Status: DC | PRN

## 2023-06-11 MED ORDER — METOPROLOL TARTRATE 50 MG PO TABS
75.0000 mg | ORAL_TABLET | Freq: Two times a day (BID) | ORAL | Status: DC
  Administered 2023-06-11 – 2023-06-12 (×3): 75 mg
  Filled 2023-06-11 (×3): qty 1

## 2023-06-11 NOTE — Progress Notes (Addendum)
Extubated, still confused but awake, nonlabored respirations. Con't closely monitoring in ICU.   DIL Toniann Fail called to give update. She had called this morning and spoken to RN. Left a message.  Son Fayrene Fearing called to give update- all questions answered.  Steffanie Dunn, DO 06/11/23 12:02 PM [facility]  For contact information, see Amion. If no response to pager, please call PCCM consult pager. After hours, 7PM- 7AM, please call Elink.    Checked back on her this afternoon. Still very sleepy but will track with her eyes, not yet following y/n questions but did cough on command.   Steffanie Dunn, DO 06/11/23 3:01 PM [facility]  For contact information, see Amion. If no response to pager, please call PCCM consult pager. After hours, 7PM- 7AM, please call Elink.

## 2023-06-11 NOTE — Plan of Care (Signed)
  Problem: Clinical Measurements: Goal: Diagnostic test results will improve Outcome: Progressing   Problem: Safety: Goal: Ability to remain free from injury will improve Outcome: Progressing   Problem: Fluid Volume: Goal: Ability to maintain a balanced intake and output will improve Outcome: Progressing

## 2023-06-11 NOTE — Procedures (Signed)
Extubation Procedure Note  Patient Details:   Name: [name] DOB: [birth year] MRN: [id number]   Airway Documentation:    Vent end date: 06/11/23 Vent end time: 1200   Evaluation  O2 sats: stable throughout Complications: No apparent complications Patient did tolerate procedure well. Bilateral Breath Sounds: Diminished   Yes  Pt extubated per physician order without complication. Pt suctioned orally, via ETT and had a positive cuff leak prior. Upon extubation pt able to cough, does not speak to staff and no stridor heard at this time. Pt placed on 4L nasal canula. RT will continue to monitor and be available as needed.   Derinda Late 06/11/2023, 12:02 PM

## 2023-06-11 NOTE — Progress Notes (Deleted)
Cardiology Office Note:    Date:  06/11/2023   ID:  Joanna, Strong 08-27-1946, MRN [id number]  PCP:  Garlan Fillers, MD   [facility] Providers Cardiologist:  Peter Swaziland, MD { Click to update primary MD,subspecialty MD or APP then REFRESH:1}    Referring MD: Garlan Fillers, MD   No chief complaint on file. ***  History of Present Illness:    [name] is a 77 y.o. female with a hx of ORLETTA SOLIMINE is a 77 y.o. female with a hx of carotid artery disease with known occlusion of the left ICA, CAD, DM2, diabetes insipidus, dizziness, hypertension, GERD, chronic pancreatitis on enzymes, CVA, PAD with stent to the left SFA, and OSA.  She has a known history of CAD with 60 to 70% stenosis in the mid LAD.  She has had several heart catheterizations in 1999, 2002, 2006, 2012 with stable anatomy.  Nuclear stress test 2020 with normal perfusion.  LVEF is known to be 45-50% by Myoview and echocardiogram.  She is status post ex lap, lysis of adhesions, end ileostomy takedown, small bowel resection and ileocolonic anastomosis on 03/23/2021 in [place].  She was admitted 04/15/2022 after presenting with strokelike symptoms at her PCPs office with right arm numbness, slurred speech.  Given her history and risk factors she was sent to the ER.  Imaging was initially unremarkable however CTA and transcranial Doppler showed a string sign concerning for eminent vascular compromise prompting vascular consultation and subsequent right TCAR 04/18/2022.  She was discharged on aspirin and Plavix.  She was admitted 01/2023 with chest pain and underwent heart catheterization that showed critical ostial RCA stenosis with heavy calcification.  Due to her CKD she returned for staged PCI and atherectomy on 02/12/2023 in which a DES 3.5 x 16 mm was placed in the ostial RCA using IVUS guidance and orbital atherectomy. At last office visit with Dr. Swaziland on 02/18/2023 he added clonidine 0.1 mg at bedtime  for uncontrolled hypertension.  She was hospitalized 7/2 - 04/20/2023 with altered mental status after being found down in her home covered in urine and feces.  She was admitted and treated for rhabdomyolysis, AKI, UTI, and pneumonia.  She was severely dehydrated which was treated and she received antibiotics.  Aspiration pneumonia and UTI treated with p.o. antibiotics after discharge. sCr at discharge was 2.13.  Losartan and SGLT2 inhibitor were held.   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 known 60-70% mid-LAD stenosis with caths in 1999, 2002, 2006, and 2012 showing stable anatomy b. low-risk NST in 10/2015  Depression    Diabetes insipidus (HCC)    Diabetes mellitus    Diverticulosis    Dizziness    chronic   Dyslipidemia    Fatty liver    GERD (gastroesophageal reflux disease)    hiatial hernia   Heart murmur    Hiatal hernia    Hypertension    Irritable bowel syndrome    Pancreatitis, chronic (HCC)    Peripheral vascular disease (HCC) 02/20/2010   s/p stent of left SFA; carotid stenosis   Seizure (HCC) 11/04/2018   last 1 yr 2020 per pt unsure month    Sleep apnea    no cpap    Stroke [facility])    Thyroid nodule     Vitamin B12 deficiency     Past Surgical History:  Procedure Laterality Date   ABDOMINAL HYSTERECTOMY     partial   AUGMENTATION MAMMAPLASTY     bilateral 1976 retro    BREAST ENHANCEMENT SURGERY     CARDIAC CATHETERIZATION  07/07/98;08/31/01;03/04/05   60 -70% LAD   CATARACT EXTRACTION Bilateral    COLON RESECTION N/A 10/14/2017   Procedure: EXPLORATORY LAPAROTOMY, EXTENDED RIGHT COLECTOMY; APPLICATION OF ABDOMINAL VACUUM DRESSING;  Surgeon: Griselda Miner, MD;  Location: [facility] ORS;  Service: General;  Laterality: N/A;   COLONOSCOPY     CORONARY ATHERECTOMY N/A 02/12/2023   Procedure: CORONARY ATHERECTOMY;  Surgeon: Swaziland, Peter M, MD;  Location: [facility] INVASIVE CV LAB;  Service: Cardiovascular;  Laterality: N/A;   CORONARY STENT INTERVENTION N/A 02/12/2023   Procedure: CORONARY STENT INTERVENTION;  Surgeon: Swaziland, Peter M, MD;  Location: [facility] INVASIVE CV LAB;  Service: Cardiovascular;  Laterality: N/A;   CORONARY ULTRASOUND/IVUS N/A 02/12/2023   Procedure: Coronary Ultrasound/IVUS;  Surgeon: Swaziland, Peter M, MD;  Location: [facility] INVASIVE CV LAB;  Service: Cardiovascular;  Laterality: N/A;   ENDOBRONCHIAL ULTRASOUND Bilateral 02/24/2017   Procedure: ENDOBRONCHIAL ULTRASOUND;  Surgeon: Leslye Peer, MD;  Location: [facility] ENDOSCOPY;  Service: Cardiopulmonary;  Laterality: Bilateral;   FEMORAL ARTERY STENT     Left leg   LAPAROTOMY N/A 10/16/2017   Procedure: EXPLORATORY LAPAROTOMY, PARTIAL OMENTECTOMY, RESECTION ISCHEMIC ILEUM 130CM, APPLICATION OF VAC ABDOMINAL DRESSING;  Surgeon: Darnell Level, MD;  Location: [facility] ORS;  Service: General;  Laterality: N/A;   LAPAROTOMY N/A 10/18/2017   Procedure: RE-EXPLORATION OF ABDOMEN, ILEOSTOMY CREATION;  Surgeon: Almond Lint, MD;  Location: [facility] OR[facility];  Service: General;  Laterality: N/A;   LEFT HEART CATH AND CORONARY ANGIOGRAPHY N/A 01/29/2023   Procedure: LEFT HEART CATH AND CORONARY ANGIOGRAPHY;  Surgeon: Swaziland, Peter M, MD;  Location: [facility] INVASIVE CV LAB;  Service:  Cardiovascular;  Laterality: N/A;   LUNG SURGERY  03/2017   Benign polyps removed   PATELLA REALIGNMENT Left    POLYPECTOMY     SIGMOIDOSCOPY     TONSILLECTOMY     TRANSCAROTID ARTERY REVASCULARIZATION  Right 04/18/2022   Procedure: Transcarotid Artery Revascularization;  Surgeon: Victorino Sparrow, MD;  Location: [facility] OR;  Service: Vascular;  Laterality: Right;    Current Medications: No outpatient medications have been marked as taking for the 06/25/23 encounter (Appointment) with Marcelino Duster, PA.     Allergies:   Adhesive [tape], Other, Ace inhibitors, Sulfa antibiotics, Topiramate, Codeine, Iodinated contrast media, and Latex   Social History   Socioeconomic History   Marital status: Divorced    Spouse name: Not on file   Number of children: 2   Years of education: Not on file   Highest education level: Not  on file  Occupational History   Occupation: retired    Associate Professor: DISABLED  Tobacco Use   Smoking status: Former    Current packs/day: 0.00    Average packs/day: 1 pack/day for 50.0 years (50.0 ttl pk-yrs)    Types: Cigarettes    Start date: 10/14/1960    Quit date: 10/14/2010    Years since quitting: 12.6   Smokeless tobacco: Never  Vaping Use   Vaping status: Never Used  Substance and Sexual Activity   Alcohol use: No   Drug use: No   Sexual activity: Not Currently    Comment: Hysterectomy  Other Topics Concern   Not on file  Social History Narrative   Not on file   Social Determinants of Health   Financial Resource Strain: Not on file  Food Insecurity: No Food Insecurity (02/12/2023)   Hunger Vital Sign    Worried About Running Out of Food in the Last Year: Never true    Ran Out of Food in the Last Year: Never true  Transportation Needs: No Transportation Needs (02/12/2023)   PRAPARE - Administrator, Civil Service (Medical): No    Lack of Transportation (Non-Medical): No  Physical Activity: Not on file  Stress: Not on file  Social  Connections: Not on file     Family History: The patient's ***family history includes Colon cancer in her paternal grandfather; Hypertension in her father and mother; Osteoarthritis in her mother; Pulmonary embolism in her mother; Stroke in her father and another family member. There is no history of Breast cancer, Colon polyps, Esophageal cancer, Rectal cancer, Stomach cancer, Pancreatic cancer, or Liver disease.  ROS:   Please see the history of present illness.    *** All other systems reviewed and are negative.  EKGs/Labs/Other Studies Reviewed:    The following studies were reviewed today: ***      Recent Labs: 04/16/2023: TSH 0.803 06/04/2023: B Natriuretic Peptide 70.7 06/06/2023: ALT 17 06/11/2023: BUN 89; Creatinine, Ser 1.70; Hemoglobin 9.6; Magnesium 1.8; Platelets 217; Potassium 4.0; Sodium 137  Recent Lipid Panel    Component Value Date/Time   CHOL 104 04/16/2023 0335   TRIG 138 04/16/2023 0335   HDL 30 (L) 04/16/2023 0335   CHOLHDL 3.5 04/16/2023 0335   VLDL 28 04/16/2023 0335   LDLCALC 46 04/16/2023 0335     Risk Assessment/Calculations:   {Does this patient have ATRIAL FIBRILLATION?:[phone]}            Physical Exam:    VS:  There were no vitals taken for this visit.    Wt Readings from Last 3 Encounters:  06/11/23 188 lb 4.4 oz (85.4 kg)  05/21/23 166 lb 3.2 oz (75.4 kg)  04/19/23 175 lb 4.3 oz (79.5 kg)     GEN: *** Well nourished, well developed in no acute distress HEENT: Normal NECK: No JVD; No carotid bruits LYMPHATICS: No lymphadenopathy CARDIAC: ***RRR, no murmurs, rubs, gallops RESPIRATORY:  Clear to auscultation without rales, wheezing or rhonchi  ABDOMEN: Soft, non-tender, non-distended MUSCULOSKELETAL:  No edema; No deformity  SKIN: Warm and dry NEUROLOGIC:  Alert and oriented x 3 PSYCHIATRIC:  Normal affect   ASSESSMENT:    No diagnosis found. PLAN:    In order of problems listed above:  ***      {Are you ordering a  CV Procedure (e.g. stress test, cath, DCCV, TEE, etc)?   Press F2        :[id number]}    Medication Adjustments/Labs and Tests  Ordered: Current medicines are reviewed at length with the patient today.  Concerns regarding medicines are outlined above.  No orders of the defined types were placed in this encounter.  No orders of the defined types were placed in this encounter.   There are no Patient Instructions on file for this visit.   Signed, Marcelino Duster, PA  06/11/2023 8:00 PM    [facility]

## 2023-06-11 NOTE — Progress Notes (Signed)
NAME:  [name], MRN:  [id number], DOB:  1946/09/22, LOS: 7 ADMISSION DATE:  06/04/2023, CONSULTATION DATE:  06/04/23 REFERRING MD:  Ray-ED, CHIEF COMPLAINT:  hypoxia   History of Present Illness:  [name] is a 77 year old woman with a history of recurrent pneumonias,, vitamin 20 B12 deficiency, stroke, seizures, hypertension, GERD, diabetes, DVT who presented to the hospital with shortness of breath and cough.  She has been admitted 4 times previously this year.  She woke up from sleep and respiratory distress.  With EMS she had oxygen saturations in the 70s on room air and despite nonrebreather only rose to mid 80s.  EMS administered DuoNebs, Solu-Medrol, magnesium.  She was placed on BiPAP in the emergency department.  She was administered vancomycin and cefepime after 1 blood culture was collected.  She failed to significantly improve on BiPAP.  She is not on inhalers at home.  Pertinent  Medical History  Hypertension Diabetes Seizures GERD B12 deficiency Stroke Sleep apnea Chronic pancreatitis CAD Colostomy  Significant Hospital Events: Including procedures, antibiotic start and stop dates in addition to other pertinent events   8/21 admitted, intubated 8/22 Remains on vent with some issues with agitation, added Precedex drip  8/24 agitated on trying to reduce sedation 8/25-agitation on reducing sedation 8/26 ABG with hypercapnia and hypercarbia, fentanyl increased and propofol maintained for better sedation , very dyssynchronous to vent.  Switched from precedex to propofol 8/27 poorly responsive with tachycardia, HTN and vent dyssynchrony, tolerated PSV better    Interim History / Subjective:  Remains on low dose propofol but only grimaces to noxious stimuli Remains mostly hypertensive and tachycardic but few low BP overnight as propofol was being titrated to help with BP/ ?anxiety issue Hgb continues to down trend, no obvious s/s bleeding Diuresed well yest.     Objective   Blood pressure (!) 201/70, pulse (!) 115, temperature 98.1 F (36.7 C), temperature source Axillary, resp. rate (!) 29, height 5\' 3"  (1.6 m), weight 85.4 kg, SpO2 100%.    Vent Mode: PRVC FiO2 (%):  [40 %] 40 % Set Rate:  [24 bmp-28 bmp] 24 bmp Vt Set:  [420 mL] 420 mL PEEP:  [5 cmH20] 5 cmH20 Pressure Support:  [5 cmH20] 5 cmH20 Plateau Pressure:  [16 cmH20] 16 cmH20   Intake/Output Summary (Last 24 hours) at 06/11/2023 0743 Last data filed at 06/11/2023 0600 Gross per 24 hour  Intake 2335.36 ml  Output 4584 ml  Net -2248.64 ml   Filed Weights   06/09/23 0352 06/10/23 0500 06/11/23 0500  Weight: 86 kg 89.7 kg 85.4 kg   Examination: Propofol 20> held General:  critically ill elderly female lying in bed in NAD HEENT: MM pink/moist, ETT/ cortrak, poor dentition, pupils 5/r, anicteric  Neuro: grimaces only upper's > lowers to noxious stimuli, not open eyes, f/c, or move spont.  Starting to follow few commands, wiggles toes, eyes open, moving head after propofol off but HR/ BP rising CV: rr, ST, no murmur PULM:  non labored, clear, no wheeze, some dyssynchrony but not causing issues, good MV, few tan secretions, +cough GI: soft, bs+, ND, foley- cyu, flexiseal  Extremities: warm/dry, generalized edema, pitting in LE Skin: ecchymosis to arms and few spots to panus  T Max 99.1 UOP 4.5L/ 24hrs  - 2.2L /24hrs Net +13L Labs reviewed> Na 137, BUN 83> 89, sCr 1.86> 1.7, WBC 22> 27> 18.1, Hgb trend 11.2> 8.2> 9.2> 8.5> 8   Resolved Hospital Problem list  Assessment & Plan:   Acute respiratory failure with hypoxia Right lower lobe and middle lobe pneumonia Severe agitation and vent dyssynchrony Hx OSA - cont PSV trials in hopes to extubate once pt is awake enough to protect her airway.  Tolerating some vent dyssynchrony on full support as long as no desaturations/ significant hemodynamic shifts - PAD protocol> continue to minimize in hopes of extubation - VAP  prevention protocol/ PPI - intermittent CXR/ ABG prn - wean FiO2 as able for SpO2 >92%  - completed 7 days of abx  8/27 - prn BD   Sepsis due to community-acquired pneumonia - completed day 7 of abx 8/27 - WBC decreasing, remains afebrile - trend WBC /fever curve - BC and trach cx neg from 8/21   AKI on CKD Stage IV  NAGMA, improving - ? Due to diarrhea, diarrhea since improved +/- AKI - continue enteral bicarb - stable sCr, although BUN slowly rising - additional lasix today as remains 13L + - trend renal indices  - strict I/Os, daily wts - avoid nephrotoxins, renal dose meds, hemodynamic support as above   Hyperglycemia - trend improving, exacerbated by steroids.  - SSI prn and CBGs q 4 w/ TF coverage   AoChronic anemia - ? Dilutional but but Hgb continues to shift down, BUN up.  No obvious signs of bleeding, monitor closely - cont PPI BID - send T&S - recheck H/H today at 1600.  Hold heparin SQ for now.  Remains on DAPT therapy given recent stent - Trend CBC daily - Transfuse per protocol   History of coronary artery disease and PCI/ stent 5/1 - ASA/ plavix/ statin - keep K> 4, Mag > 2> mag replete today   Sinus tachycardia HTN - continues to be tachycardic and hypertensive while minimizing sedating meds> increase metoprolol to 75mg  BID, clonidine to 0.2mg  TID, w/ prn lopressor and hydralazine - have room to add back norvasc and imdur when able - increase metoprolol to 50mg  BID and add prn lopressor  - add back clonidine 0.1mg  BID   History of GERD - PPI  Acute metabolic encephalopathy, ? ICU hypodelirium Hx of seizures - effexor and remeron held 8/27 - minimize sedation as tolerated  - cont keppra/ seizure precautions - if mental status remains poor> consider EEG but seems to be waking up off propofol - supportive care  History of pancreatic insufficiency due to chronic pancreatitis Diarrhea  - Unable to push Creon via tube - cont benicar - bowel  regimen prn  Best Practice (right click and "Reselect all SmartList Selections" daily)   Diet/type: tubefeeds DVT prophylaxis: prophylactic heparin > on hold, SCDs only for now GI prophylaxis: PPI Lines: N/A Foley:  Yes, and it is still needed Code Status:  full code Last date of multidisciplinary goals of care discussion: Pending  No family at bedside Son, Loraine Leriche updated by phone 8/27.  Pending 8/28  APP Time 32 minutes   Posey Boyer, MSN, AG-ACNP-BC [facility] 06/11/2023, 7:43 AM  See Amion for pager If no response to pager , please call [phone] until 7pm After 7:00 pm call Elink  [phone]

## 2023-06-12 DIAGNOSIS — J9601 Acute respiratory failure with hypoxia: Secondary | ICD-10-CM | POA: Diagnosis not present

## 2023-06-12 DIAGNOSIS — G9341 Metabolic encephalopathy: Secondary | ICD-10-CM | POA: Diagnosis not present

## 2023-06-12 DIAGNOSIS — N179 Acute kidney failure, unspecified: Secondary | ICD-10-CM | POA: Diagnosis not present

## 2023-06-12 DIAGNOSIS — J9600 Acute respiratory failure, unspecified whether with hypoxia or hypercapnia: Secondary | ICD-10-CM | POA: Diagnosis not present

## 2023-06-12 DIAGNOSIS — R918 Other nonspecific abnormal finding of lung field: Secondary | ICD-10-CM

## 2023-06-12 LAB — RENAL FUNCTION PANEL
Albumin: 2.2 g/dL — ABNORMAL LOW (ref 3.5–5.0)
Anion gap: 13 (ref 5–15)
BUN: 83 mg/dL — ABNORMAL HIGH (ref 8–23)
CO2: 26 mmol/L (ref 22–32)
Calcium: 9.3 mg/dL (ref 8.9–10.3)
Chloride: 99 mmol/L (ref 98–111)
Creatinine, Ser: 1.32 mg/dL — ABNORMAL HIGH (ref 0.44–1.00)
GFR, Estimated: 42 mL/min — ABNORMAL LOW (ref >60)
Glucose, Bld: 201 mg/dL — ABNORMAL HIGH (ref 70–99)
Phosphorus: 3.6 mg/dL (ref 2.5–4.6)
Potassium: 4.2 mmol/L (ref 3.5–5.1)
Sodium: 138 mmol/L (ref 135–145)

## 2023-06-12 LAB — CBC
HCT: 26.7 % — ABNORMAL LOW (ref 36.0–46.0)
Hemoglobin: 8.7 g/dL — ABNORMAL LOW (ref 12.0–15.0)
MCH: 28.4 pg (ref 26.0–34.0)
MCHC: 32.6 g/dL (ref 30.0–36.0)
MCV: 87.3 fL (ref 80.0–100.0)
Platelets: 269 10*3/uL (ref 150–400)
RBC: 3.06 MIL/uL — ABNORMAL LOW (ref 3.87–5.11)
RDW: 14 % (ref 11.5–15.5)
WBC: 20.1 10*3/uL — ABNORMAL HIGH (ref 4.0–10.5)
nRBC: 0.8 % — ABNORMAL HIGH (ref 0.0–0.2)

## 2023-06-12 LAB — TRIGLYCERIDES: Triglycerides: 104 mg/dL (ref <150)

## 2023-06-12 LAB — GLUCOSE, CAPILLARY
Glucose-Capillary: 134 mg/dL — ABNORMAL HIGH (ref 70–99)
Glucose-Capillary: 154 mg/dL — ABNORMAL HIGH (ref 70–99)
Glucose-Capillary: 155 mg/dL — ABNORMAL HIGH (ref 70–99)
Glucose-Capillary: 161 mg/dL — ABNORMAL HIGH (ref 70–99)
Glucose-Capillary: 194 mg/dL — ABNORMAL HIGH (ref 70–99)
Glucose-Capillary: 199 mg/dL — ABNORMAL HIGH (ref 70–99)

## 2023-06-12 LAB — MAGNESIUM: Magnesium: 2.2 mg/dL (ref 1.7–2.4)

## 2023-06-12 MED ORDER — IPRATROPIUM-ALBUTEROL 0.5-2.5 (3) MG/3ML IN SOLN: 3.0000 mL | RESPIRATORY_TRACT | Status: DC | PRN

## 2023-06-12 MED ORDER — INSULIN ASPART 100 UNIT/ML IJ SOLN
6.0000 [IU] | INTRAMUSCULAR | Status: DC
  Administered 2023-06-12 – 2023-06-16 (×19): 6 [IU] via SUBCUTANEOUS

## 2023-06-12 MED ORDER — AMLODIPINE BESYLATE 10 MG PO TABS
10.0000 mg | ORAL_TABLET | Freq: Every day | ORAL | Status: DC
  Administered 2023-06-12 – 2023-06-16 (×5): 10 mg
  Filled 2023-06-12 (×5): qty 1

## 2023-06-12 MED ORDER — HEPARIN SODIUM (PORCINE) 5000 UNIT/ML IJ SOLN
5000.0000 [IU] | Freq: Three times a day (TID) | INTRAMUSCULAR | Status: DC
  Administered 2023-06-13: 5000 [IU] via SUBCUTANEOUS
  Filled 2023-06-12: qty 1

## 2023-06-12 MED ORDER — FUROSEMIDE 10 MG/ML IJ SOLN
60.0000 mg | Freq: Once | INTRAMUSCULAR | Status: AC
  Administered 2023-06-12: 60 mg via INTRAVENOUS
  Filled 2023-06-12: qty 6

## 2023-06-12 MED ORDER — SODIUM BICARBONATE 650 MG PO TABS
650.0000 mg | ORAL_TABLET | Freq: Two times a day (BID) | ORAL | Status: DC
  Administered 2023-06-12 – 2023-06-13 (×3): 650 mg
  Filled 2023-06-12 (×3): qty 1

## 2023-06-12 MED ORDER — METOPROLOL TARTRATE 50 MG PO TABS
100.0000 mg | ORAL_TABLET | Freq: Two times a day (BID) | ORAL | Status: DC
  Administered 2023-06-12 – 2023-06-16 (×8): 100 mg
  Filled 2023-06-12 (×8): qty 2

## 2023-06-12 NOTE — Progress Notes (Signed)
Nutrition Follow-up  DOCUMENTATION CODES:   Not applicable  INTERVENTION:  - Continue Vivonex RTF at goal rate of 70 ml/hr (1680 ml/day) - PROSource TF20 60 ml daily   Tube feeding regimen at goal rate provides 1760 kcal, 104 grams of protein, and 1425 ml of H2O.  NUTRITION DIAGNOSIS:   Inadequate oral intake related to inability to eat as evidenced by NPO status.  GOAL:   Patient will meet greater than or equal to 90% of their needs  MONITOR:   Vent status, Labs, Weight trends, TF tolerance, I & O's  REASON FOR ASSESSMENT:   Ventilator, Consult Enteral/tube feeding initiation and management  ASSESSMENT:   77 year old female who presented to the ED on 8/21 with respiratory distress. PMH of recurrent pneumonias, vitamin B12 deficiency, stroke, seizures, HTN, GERD, DM, DVT, chronic pancreatitis, CAD, colostomy. Pt admitted with pneumonia, sepsis.  Meds reviewed: lipitor, sliding scale insulin. Labs reviewed: BUN/Creatinine elevated. IVF/Drips: Cleviprex (8 mL/hr ~ 384 kcals). I/Os reviewed: 750 mL stool output in past 24 hrs.   Pt has been extubated since last assessment. TF running at goal rate via Cortrak. RN reports that the pt has been tolerating TF well with no issues. Pt remains NPO and disoriented x4. Will continue to monitor TF tolerance and POC.   Diet Order:   Diet Order             Diet NPO time specified  Diet effective now                   EDUCATION NEEDS:   Not appropriate for education at this time  Skin:  Skin Assessment: Reviewed RN Assessment  Last BM:  8/29 - type 6  Height:   Ht Readings from Last 1 Encounters:  06/04/23 5\' 3"  (1.6 m)    Weight:   Wt Readings from Last 1 Encounters:  06/12/23 82.9 kg    Ideal Body Weight:  52.3 kg  BMI:  Body mass index is 32.37 kg/m.  Estimated Nutritional Needs:   Kcal:  1650-1850  Protein:  85-100 grams  Fluid:  1.6-1.8 L  Bethann Humble, RD, LDN, CNSC.

## 2023-06-12 NOTE — Progress Notes (Addendum)
NAME:  [name], MRN:  [id number], DOB:  1946-07-05, LOS: 8 ADMISSION DATE:  06/04/2023, CONSULTATION DATE:  06/04/23 REFERRING MD:  Ray-ED, CHIEF COMPLAINT:  hypoxia   History of Present Illness:  [name] is a 77 year old woman with a history of recurrent pneumonias,, vitamin 20 B12 deficiency, stroke, seizures, hypertension, GERD, diabetes, DVT who presented to the hospital with shortness of breath and cough.  She has been admitted 4 times previously this year.  She woke up from sleep and respiratory distress.  With EMS she had oxygen saturations in the 70s on room air and despite nonrebreather only rose to mid 80s.  EMS administered DuoNebs, Solu-Medrol, magnesium.  She was placed on BiPAP in the emergency department.  She was administered vancomycin and cefepime after 1 blood culture was collected.  She failed to significantly improve on BiPAP.  She is not on inhalers at home.  Pertinent  Medical History  Hypertension Diabetes Seizures GERD B12 deficiency Stroke Sleep apnea Chronic pancreatitis CAD Colostomy  Significant Hospital Events: Including procedures, antibiotic start and stop dates in addition to other pertinent events   8/21 admitted, intubated 8/22 Remains on vent with some issues with agitation, added Precedex drip  8/24 agitated on trying to reduce sedation 8/25-agitation on reducing sedation 8/26 ABG with hypercapnia and hypercarbia, fentanyl increased and propofol maintained for better sedation , very dyssynchronous to vent.  Switched from precedex to propofol 8/27 poorly responsive with tachycardia, HTN and vent dyssynchrony, tolerated PSV better  8/28 sedation stopped/ extubated, hypertensive started on cleviprex   Interim History / Subjective:  Remains stable after extubation yesterday Mental status slowly improving Off cleviprex overnight Diuresing well  Objective   Blood pressure (!) 225/93, pulse 82, temperature 99.8 F (37.7 C), temperature  source Axillary, resp. rate (!) 23, height 5\' 3"  (1.6 m), weight 82.9 kg, SpO2 98%.        Intake/Output Summary (Last 24 hours) at 06/12/2023 1142 Last data filed at 06/12/2023 1000 Gross per 24 hour  Intake 1735.77 ml  Output 6875 ml  Net -5139.23 ml   Filed Weights   06/10/23 0500 06/11/23 0500 06/12/23 0500  Weight: 89.7 kg 85.4 kg 82.9 kg   Examination: General:  Elderly female sitting upright in bed in NAD HEENT: MM pink/moist, cortrak, pupils 4/r Neuro: opens eyes to verbal, some slurred speech states "I'm fine", knows her last name, follows simple commands, MAE/ generalized weakness  CV:  rr, ST 105, no murmur PULM:  non labored, mild tachypnea at times (when stimulated), coarse, faint exp wheeze, diminished in bases GI: obese, +bs, NT, foley- cyu, FMS- bilious green Extremities: warm/dry, generalized, improving, residual +2 LE edema  Skin: ecchymosis to arms   afebrile UOP 6.3L/ 24hrs  - 5.6L /24hrs Net +7.5 Labs reviewed> Na 138, BUN 89> 83, sCr 1.86> 1.7> 1.32, bicarb 26, triglycerides 104, WBC 18.1> 20, Hgb trend 11.2> 8.2> 9.2> 8.5> 8> 9.6> 8.7  Resolved Hospital Problem list     Assessment & Plan:   Acute respiratory failure with hypoxia Right lower lobe and middle lobe pneumonia Severe agitation and vent dyssynchrony, resolved Hx OSA not on CPAP Former smoker (pack years 50) - completed 7 days of abx  8/27 - s/p extubation 8/28, doing well thus far, no resp distress - continue airway watch, high risk given mental status - NPO, cont cortrak for now w/ aspiration precautions - wean FiO2 for sat goal > 92% - pulm hygiene > as able, IS/ PT/ mobilize as  able - cont diureses as below  - CXR prn - add prn duonebs  - SLP when appropriate    Sepsis due to community-acquired pneumonia - completed day 7 of abx 8/27.  Cultures neg from 8/21 - monitor clinically for now, trend WBC/ fever curve   AKI on CKD Stage IV, improving  NAGMA, resolved, felt related  to AKI/ GI losses w/diarrhea - decrease bicarb enterally  - cont lasix today and assess daily  - foley trial out> monitor for urinary retention - trend renal indices  - strict I/Os, daily wts - avoid nephrotoxins, renal dose meds, hemodynamic support as above   Hyperglycemia, stable - SSI prn and CBGs q 4 w/ TF coverage   AoChronic anemia - felt to be likely dilutional initially but Hgb trended down, BUN up. - heparin SQ held.  Continued on plavix/ asa given recent stent/ high risk.  H/H stable today, restart 8/30 and monitor carefully - cont PPI BID - T&S sent 8/28 - H/H trend stable.  No evidence of bleeding - Trend CBC daily - Transfuse per protocol   History of coronary artery disease s/p PCI/ stent on 02/12/23 Sinus tachycardia HTN  - BP continues to be liable, spikes with stimulation - cleviprex for SBP < 180 - check echo  - cont ASA/ plavix with recent DES placement - statin - keep K> 4, Mag > 2 - cont clonidine 0.2mg  TID, increase metoprolol 100mg  BID, add back home amlodipine, consider imdur when taking POs - prn IV lopressor and hydralazine    History of GERD - PPI  Acute metabolic encephalopathy, ? ICU hypodelirium Hx of seizures - effexor and remeron held 8/27 and sedation minimized - slowly with improving mental status, likely just needs time to clear meds with AKI - cont keppra/ seizure precautions - if mental status not improving> consider EEG  - supportive care as above - consider decreasing clonidine to home dose if suspect causing any sedation   History of pancreatic insufficiency due to chronic pancreatitis Diarrhea  - Unable to push Creon via tube - cont benicar - bowel regimen prn   Depression - effexor held 8/27 due to oversedation  Best Practice (right click and "Reselect all SmartList Selections" daily)   Diet/type: tubefeeds/ NPO DVT prophylaxis: prophylactic heparin > on hold, SCDs only for now GI prophylaxis: PPI Lines:  N/A Foley:  Yes, and it is no longer needed and removal ordered  Code Status:  full code Last date of multidisciplinary goals of care discussion: Pending  No family at bedside DIL, [name] updated by phone  APP Time 35 minutes   Posey Boyer, MSN, AG-ACNP-BC [facility] 06/12/2023, 11:42 AM  See Amion for pager If no response to pager , please call [phone] until 7pm After 7:00 pm call Elink  [phone]

## 2023-06-12 NOTE — TOC Progression Note (Signed)
Transition of Care [facility]) - Progression Note    Patient Details  Name: [name] MRN: [id number] Date of Birth: April 12, 1946  [facility] of [facility] CM/SW Contact  Tom-Johnson, Hershal Coria, RN Phone Number: 06/12/2023, 1:17 PM  Clinical Narrative:      CM consulted for LTAC placement. CM spoke with patient's son, Fayrene Fearing and gave him list of LTAC from [email or web address]  Fayrene Fearing chose [facility]. Referral sent to [facility] with acceptance noted. MD notified.  CM will continue to follow as patient progresses towards discharge.             Expected Discharge Plan and Services                                               Social Determinants of Health (SDOH) Interventions SDOH Screenings   Food Insecurity: No Food Insecurity (02/12/2023)  Housing: Patient Unable To Answer (02/12/2023)  Transportation Needs: No Transportation Needs (02/12/2023)  Utilities: Not At Risk (02/12/2023)  Tobacco Use: Medium Risk (06/04/2023)    Readmission Risk Interventions    06/04/2023    3:49 PM  Readmission Risk Prevention Plan  Transportation Screening Complete  Medication Review (RN Care Manager) Referral to Pharmacy  PCP or Specialist appointment within 3-5 days of discharge Complete  HRI or Home Care Consult Complete  SW Recovery Care/Counseling Consult Complete  Palliative Care Screening Not Applicable  Skilled Nursing Facility Not Applicable

## 2023-06-12 NOTE — Progress Notes (Signed)
PT Cancellation Note  Patient Details Name: [name] MRN: [id number] DOB: June 16, 1946   Cancelled Treatment:    Reason Eval/Treat Not Completed: Fatigue/lethargy limiting ability to participate (attempted to see pt. In full chair position, sternal rub, auditory and tactile stimulation with cold wash cloth and no change in arousal. Pt too lethargic to participate at this time)   Araina Butrick B Mariah Harn 06/12/2023, 9:14 AM Merryl Hacker, PT Acute Rehabilitation Services Office: [phone]

## 2023-06-12 NOTE — Plan of Care (Signed)
  Problem: Health Behavior/Discharge Planning: Goal: Ability to manage health-related needs will improve Outcome: Progressing   Problem: Clinical Measurements: Goal: Ability to maintain clinical measurements within normal limits will improve Outcome: Progressing Goal: Diagnostic test results will improve Outcome: Progressing Goal: Respiratory complications will improve Outcome: Progressing   Problem: Nutrition: Goal: Adequate nutrition will be maintained Outcome: Progressing   Problem: Elimination: Goal: Will not experience complications related to bowel motility Outcome: Progressing

## 2023-06-12 NOTE — Progress Notes (Signed)
OT Cancellation Note  Patient Details Name: [name] MRN: [id number] DOB: 07-Nov-1945   Cancelled Treatment:    Reason Eval/Treat Not Completed: Fatigue/lethargy limiting ability to participate. PT attempted to see pt and despite moved to chair position, sternal rub, etc unable to arouse pt. RN also with no success arousing pt this morning. Will follow up as pt medically ready.   Tyler Deis, OTR/L [facility] Acute Rehabilitation Office: [phone]   [name] 06/12/2023, 12:07 PM

## 2023-06-12 NOTE — Plan of Care (Signed)

## 2023-06-13 ENCOUNTER — Inpatient Hospital Stay (HOSPITAL_COMMUNITY): Payer: Medicare HMO

## 2023-06-13 DIAGNOSIS — I10 Essential (primary) hypertension: Secondary | ICD-10-CM

## 2023-06-13 DIAGNOSIS — G9341 Metabolic encephalopathy: Secondary | ICD-10-CM | POA: Diagnosis not present

## 2023-06-13 DIAGNOSIS — J9601 Acute respiratory failure with hypoxia: Secondary | ICD-10-CM | POA: Diagnosis not present

## 2023-06-13 DIAGNOSIS — N179 Acute kidney failure, unspecified: Secondary | ICD-10-CM | POA: Diagnosis not present

## 2023-06-13 DIAGNOSIS — J9600 Acute respiratory failure, unspecified whether with hypoxia or hypercapnia: Secondary | ICD-10-CM | POA: Diagnosis not present

## 2023-06-13 DIAGNOSIS — R918 Other nonspecific abnormal finding of lung field: Secondary | ICD-10-CM | POA: Diagnosis not present

## 2023-06-13 LAB — ECHOCARDIOGRAM COMPLETE
AR max vel: 2.08 cm2
AV Area VTI: 1.82 cm2
AV Area mean vel: 1.81 cm2
AV Mean grad: 7 mmHg
AV Peak grad: 13.2 mmHg
Ao pk vel: 1.82 m/s
Area-P 1/2: 5.54 cm2
Height: 63 in
S' Lateral: 3.5 cm
Weight: 2927.71 [oz_av]

## 2023-06-13 LAB — CBC
HCT: 25.4 % — ABNORMAL LOW (ref 36.0–46.0)
Hemoglobin: 7.9 g/dL — ABNORMAL LOW (ref 12.0–15.0)
MCH: 28.4 pg (ref 26.0–34.0)
MCHC: 31.1 g/dL (ref 30.0–36.0)
MCV: 91.4 fL (ref 80.0–100.0)
Platelets: 256 10*3/uL (ref 150–400)
RBC: 2.78 MIL/uL — ABNORMAL LOW (ref 3.87–5.11)
RDW: 14.3 % (ref 11.5–15.5)
WBC: 20.4 10*3/uL — ABNORMAL HIGH (ref 4.0–10.5)
nRBC: 0.8 % — ABNORMAL HIGH (ref 0.0–0.2)

## 2023-06-13 LAB — RENAL FUNCTION PANEL
Albumin: 2.3 g/dL — ABNORMAL LOW (ref 3.5–5.0)
Anion gap: 13 (ref 5–15)
BUN: 98 mg/dL — ABNORMAL HIGH (ref 8–23)
CO2: 27 mmol/L (ref 22–32)
Calcium: 9.3 mg/dL (ref 8.9–10.3)
Chloride: 104 mmol/L (ref 98–111)
Creatinine, Ser: 1.52 mg/dL — ABNORMAL HIGH (ref 0.44–1.00)
GFR, Estimated: 35 mL/min — ABNORMAL LOW (ref >60)
Glucose, Bld: 153 mg/dL — ABNORMAL HIGH (ref 70–99)
Phosphorus: 3.9 mg/dL (ref 2.5–4.6)
Potassium: 4.1 mmol/L (ref 3.5–5.1)
Sodium: 144 mmol/L (ref 135–145)

## 2023-06-13 LAB — GLUCOSE, CAPILLARY
Glucose-Capillary: 157 mg/dL — ABNORMAL HIGH (ref 70–99)
Glucose-Capillary: 149 mg/dL — ABNORMAL HIGH (ref 70–99)
Glucose-Capillary: 144 mg/dL — ABNORMAL HIGH (ref 70–99)
Glucose-Capillary: 102 mg/dL — ABNORMAL HIGH (ref 70–99)
Glucose-Capillary: 173 mg/dL — ABNORMAL HIGH (ref 70–99)
Glucose-Capillary: 135 mg/dL — ABNORMAL HIGH (ref 70–99)

## 2023-06-13 LAB — MAGNESIUM: Magnesium: 2.2 mg/dL (ref 1.7–2.4)

## 2023-06-13 MED ORDER — HYDRALAZINE HCL 20 MG/ML IJ SOLN
10.0000 mg | INTRAMUSCULAR | Status: DC | PRN
  Administered 2023-06-14: 10 mg via INTRAVENOUS
  Filled 2023-06-13: qty 1

## 2023-06-13 MED ORDER — CLONIDINE HCL 0.1 MG PO TABS
0.1000 mg | ORAL_TABLET | Freq: Three times a day (TID) | ORAL | Status: DC
  Administered 2023-06-13 – 2023-06-14 (×3): 0.1 mg
  Filled 2023-06-13 (×3): qty 1

## 2023-06-13 MED ORDER — HYDRALAZINE HCL 50 MG PO TABS
50.0000 mg | ORAL_TABLET | Freq: Three times a day (TID) | ORAL | Status: DC
  Administered 2023-06-13 – 2023-06-14 (×3): 50 mg
  Filled 2023-06-13 (×3): qty 1

## 2023-06-13 MED ORDER — LABETALOL HCL 5 MG/ML IV SOLN: 10.0000 mg | INTRAVENOUS | Status: DC | PRN

## 2023-06-13 NOTE — Progress Notes (Signed)
Echocardiogram 2D Echocardiogram has been performed.  [name] 06/13/2023, 12:06 PM

## 2023-06-13 NOTE — Plan of Care (Signed)
  Problem: Clinical Measurements: Goal: Ability to maintain clinical measurements within normal limits will improve Outcome: Progressing   Problem: Health Behavior/Discharge Planning: Goal: Ability to manage health-related needs will improve Outcome: Progressing   Problem: Education: Goal: Knowledge of General Education information will improve Description: Including pain rating scale, medication(s)/side effects and non-pharmacologic comfort measures Outcome: Progressing   Problem: Clinical Measurements: Goal: Cardiovascular complication will be avoided Outcome: Progressing   Problem: Activity: Goal: Risk for activity intolerance will decrease Outcome: Progressing   Problem: Pain Managment: Goal: General experience of comfort will improve Outcome: Progressing

## 2023-06-13 NOTE — Progress Notes (Signed)
OT Cancellation Note  Patient Details Name: [name] MRN: [id number] DOB: July 20, 1946   Cancelled Treatment:    Reason Eval/Treat Not Completed: Fatigue/lethargy limiting ability to participate. Pt remains unable to arouse or participate despite max stim. Will follow up as appropriate.   Tyler Deis, OTR/L [facility] Acute Rehabilitation Office: [phone]   [name] 06/13/2023, 9:58 AM

## 2023-06-13 NOTE — Progress Notes (Signed)
NAME:  [name], MRN:  [id number], DOB:  [birth year], LOS: 9 ADMISSION DATE:  06/04/2023, CONSULTATION DATE:  06/04/23 REFERRING MD:  Ray-ED, CHIEF COMPLAINT:  hypoxia   History of Present Illness:  [name] is a 77 year old woman with a history of recurrent pneumonias,, vitamin 20 B12 deficiency, stroke, seizures, hypertension, GERD, diabetes, DVT who presented to the hospital with shortness of breath and cough.  She has been admitted 4 times previously this year.  She woke up from sleep and respiratory distress.  With EMS she had oxygen saturations in the 70s on room air and despite nonrebreather only rose to mid 80s.  EMS administered DuoNebs, Solu-Medrol, magnesium.  She was placed on BiPAP in the emergency department.  She was administered vancomycin and cefepime after 1 blood culture was collected.  She failed to significantly improve on BiPAP.  She is not on inhalers at home.  Pertinent  Medical History  Hypertension Diabetes Seizures GERD B12 deficiency Stroke Sleep apnea Chronic pancreatitis CAD Colostomy  Significant Hospital Events: Including procedures, antibiotic start and stop dates in addition to other pertinent events   8/21 admitted, intubated 8/22 Remains on vent with some issues with agitation, added Precedex drip  8/24 agitated on trying to reduce sedation 8/25-agitation on reducing sedation 8/26 ABG with hypercapnia and hypercarbia, fentanyl increased and propofol maintained for better sedation , very dyssynchronous to vent.  Switched from precedex to propofol 8/27 poorly responsive with tachycardia, HTN and vent dyssynchrony, tolerated PSV better  8/28 sedation stopped/ extubated, hypertensive started on cleviprex 8/29 remains stable postextubation, mentation improving and off Precedex.  Diuresing well 8/30 continues to remain significantly encephalopathic, withdraws to verbal stimuli but will not follow any commands   Interim History / Subjective:  No  acute events overnight  Objective   Blood pressure 98/60, pulse 92, temperature 99.2 F (37.3 C), temperature source Axillary, resp. rate (!) 22, height 5\' 3"  (1.6 m), weight 83 kg, SpO2 100%.        Intake/Output Summary (Last 24 hours) at 06/13/2023 0846 Last data filed at 06/13/2023 0500 Gross per 24 hour  Intake 498.97 ml  Output 3075 ml  Net -2576.03 ml   Filed Weights   06/11/23 0500 06/12/23 0500 06/13/23 0500  Weight: 85.4 kg 82.9 kg 83 kg   Examination: General: Acute on chronic ill-appearing elderly female lying in bed in no acute distress HEENT: [place]/AT, MM pink/moist, PERRL,  Neuro: Significantly encephalopathic opens eyes to verbal stimuli unable to follow commands CV: s1s2 regular rate and rhythm, no murmur, rubs, or gallops,  PULM: Slight rhonchi bilaterally, no increased work of breathing,  GI: soft, bowel sounds active in all 4 quadrants, non-tender, non-distended, tolerating TF Extremities: warm/dry, no edema  Skin: no rashes or lesions  Resolved Hospital Problem list   Severe agitation and vent dyssynchrony, resolved Sepsis due to community-acquired pneumonia - completed day 7 of abx 8/27.  Cultures neg from 8/21 NAGMA, resolved, felt related to AKI/ GI losses w/diarrhea  Assessment & Plan:   Acute respiratory failure with hypoxia Right lower lobe and middle lobe pneumonia Hx OSA not on CPAP Former smoker (pack years 50) - completed 7 days of abx  8/27 - s/p extubation 8/28, doing well thus far, no resp distress P: Close monitoring in the ICU setting due to high risk for aspiration/reintubation due to poor mentation N.p.o. until mentation improves Continue tube feeds per cortrak Aggressive pulmonary hygiene as able Diurese as able As needed bronchodilators SLP  Acute  metabolic encephalopathy, ? ICU hypodelirium Uremia -BUN remains elevated at 98 Hx of seizures P: Neuro active measures Nutrition and bowel regiment  Seizure precautions   Aspirations precautions  Continue Keppra Wean clonidine back down   AKI on CKD Stage IV, improving  -Creatinine remained stable at 1.52 however BUN remains elevated at 98 P: Follow renal function  Monitor urine output Trend Bmet Avoid nephrotoxins Ensure adequate renal perfusion  Stop sodium bicarb supplementation   Hyperglycemia, stable P: Continue SSI and tube feed coverage CBG checks every 4 CBG goal 140-180  Acute on chronic anemia - felt to be likely dilutional initially but Hgb trended down, BUN up. P: Trend CBC Transfuse per protocol Hemoglobin goal greater than 7 Sq heparin   History of coronary artery disease s/p PCI/ stent on 02/12/23 Sinus tachycardia HTN  P: Continue aspirin and statin Continue Plavix [place] electrolytes Continue clonidine, metoprolol, Norvasc  History of GERD P: PPI  History of pancreatic insufficiency due to chronic pancreatitis Diarrhea  -Unable to push Creon via tube  P: Continue Benicar Bowel regiment as needed  Depression -Effexor held 8/27 due to oversedation P: Supportive care  Best Practice (right click and "Reselect all SmartList Selections" daily)   Diet/type: tubefeeds/ NPO DVT prophylaxis: prophylactic heparin > on hold, SCDs only for now GI prophylaxis: PPI Lines: N/A Foley:  Yes, and it is no longer needed and removal ordered  Code Status:  full code Last date of multidisciplinary goals of care discussion: No family at bedside am   Joanna Wareing D. Harris, NP-C [facility] Personal contact information can be found on Amion  If no contact or response made please call [phone] 06/13/2023, 9:13 AM

## 2023-06-13 NOTE — Progress Notes (Signed)
PCCM progress note  Decision made 8/29 to resume subcu heparin this a.m. 8/30 by afternoon RN reported small amount bright red blood per rectum with slight downtrend in H&H this a.m.  Will stop subcu heparin and continue SCDs, aspirin, and Plavix for now.  Carola Viramontes D. Harris, NP-C [facility] Personal contact information can be found on Amion  If no contact or response made please call [phone] 06/13/2023, 1:48 PM

## 2023-06-13 NOTE — Progress Notes (Signed)
PT Cancellation Note  Patient Details Name: [name] MRN: [id number] DOB: [birth year]   Cancelled Treatment:    Reason Eval/Treat Not Completed: Fatigue/lethargy limiting ability to participate (pt remains unable to arouse or participate despite max stimulation, positioning and sternal rub)   Kyndall Amero B Rashay Barnette 06/13/2023, 7:58 AM Merryl Hacker, PT Acute Rehabilitation Services Office: [phone]

## 2023-06-14 ENCOUNTER — Inpatient Hospital Stay (HOSPITAL_COMMUNITY): Payer: Medicare HMO

## 2023-06-14 DIAGNOSIS — R918 Other nonspecific abnormal finding of lung field: Secondary | ICD-10-CM | POA: Diagnosis not present

## 2023-06-14 DIAGNOSIS — J9600 Acute respiratory failure, unspecified whether with hypoxia or hypercapnia: Secondary | ICD-10-CM | POA: Diagnosis not present

## 2023-06-14 DIAGNOSIS — G9341 Metabolic encephalopathy: Secondary | ICD-10-CM | POA: Diagnosis not present

## 2023-06-14 DIAGNOSIS — J9601 Acute respiratory failure with hypoxia: Secondary | ICD-10-CM | POA: Diagnosis not present

## 2023-06-14 DIAGNOSIS — N179 Acute kidney failure, unspecified: Secondary | ICD-10-CM | POA: Diagnosis not present

## 2023-06-14 LAB — CBC WITH DIFFERENTIAL/PLATELET
Abs Immature Granulocytes: 0 10*3/uL (ref 0.00–0.07)
Basophils Absolute: 0 10*3/uL (ref 0.0–0.1)
Basophils Relative: 0 %
Eosinophils Absolute: 0.3 10*3/uL (ref 0.0–0.5)
Eosinophils Relative: 1 %
HCT: 23.6 % — ABNORMAL LOW (ref 36.0–46.0)
Hemoglobin: 7.3 g/dL — ABNORMAL LOW (ref 12.0–15.0)
Lymphocytes Relative: 8 %
Lymphs Abs: 2.3 10*3/uL (ref 0.7–4.0)
MCH: 30.4 pg (ref 26.0–34.0)
MCHC: 30.9 g/dL (ref 30.0–36.0)
MCV: 98.3 fL (ref 80.0–100.0)
Monocytes Absolute: 1.1 10*3/uL — ABNORMAL HIGH (ref 0.1–1.0)
Monocytes Relative: 4 %
Neutro Abs: 24.5 10*3/uL — ABNORMAL HIGH (ref 1.7–7.7)
Neutrophils Relative %: 87 %
Platelets: 245 10*3/uL (ref 150–400)
RBC: 2.4 MIL/uL — ABNORMAL LOW (ref 3.87–5.11)
RDW: 15 % (ref 11.5–15.5)
WBC: 28.2 10*3/uL — ABNORMAL HIGH (ref 4.0–10.5)
nRBC: 0 /100 WBC
nRBC: 0.6 % — ABNORMAL HIGH (ref 0.0–0.2)

## 2023-06-14 LAB — POCT I-STAT 7, (LYTES, BLD GAS, ICA,H+H)
Acid-Base Excess: 5 mmol/L — ABNORMAL HIGH (ref 0.0–2.0)
Bicarbonate: 28.7 mmol/L — ABNORMAL HIGH (ref 20.0–28.0)
Calcium, Ion: 1.35 mmol/L (ref 1.15–1.40)
HCT: 22 % — ABNORMAL LOW (ref 36.0–46.0)
Hemoglobin: 7.5 g/dL — ABNORMAL LOW (ref 12.0–15.0)
O2 Saturation: 94 %
Patient temperature: 96.7
Potassium: 4.2 mmol/L (ref 3.5–5.1)
Sodium: 151 mmol/L — ABNORMAL HIGH (ref 135–145)
TCO2: 30 mmol/L (ref 22–32)
pCO2 arterial: 38.2 mmHg (ref 32–48)
pH, Arterial: 7.48 — ABNORMAL HIGH (ref 7.35–7.45)
pO2, Arterial: 63 mmHg — ABNORMAL LOW (ref 83–108)

## 2023-06-14 LAB — RENAL FUNCTION PANEL
Albumin: 2.1 g/dL — ABNORMAL LOW (ref 3.5–5.0)
Anion gap: 10 (ref 5–15)
BUN: 92 mg/dL — ABNORMAL HIGH (ref 8–23)
CO2: 29 mmol/L (ref 22–32)
Calcium: 9.5 mg/dL (ref 8.9–10.3)
Chloride: 110 mmol/L (ref 98–111)
Creatinine, Ser: 1.72 mg/dL — ABNORMAL HIGH (ref 0.44–1.00)
GFR, Estimated: 30 mL/min — ABNORMAL LOW (ref >60)
Glucose, Bld: 111 mg/dL — ABNORMAL HIGH (ref 70–99)
Phosphorus: 3.8 mg/dL (ref 2.5–4.6)
Potassium: 3.6 mmol/L (ref 3.5–5.1)
Sodium: 149 mmol/L — ABNORMAL HIGH (ref 135–145)

## 2023-06-14 LAB — CBC
HCT: 23.2 % — ABNORMAL LOW (ref 36.0–46.0)
Hemoglobin: 7.2 g/dL — ABNORMAL LOW (ref 12.0–15.0)
MCH: 29.1 pg (ref 26.0–34.0)
MCHC: 31 g/dL (ref 30.0–36.0)
MCV: 93.9 fL (ref 80.0–100.0)
Platelets: 241 10*3/uL (ref 150–400)
RBC: 2.47 MIL/uL — ABNORMAL LOW (ref 3.87–5.11)
RDW: 14.7 % (ref 11.5–15.5)
WBC: 23.5 10*3/uL — ABNORMAL HIGH (ref 4.0–10.5)
nRBC: 0.5 % — ABNORMAL HIGH (ref 0.0–0.2)

## 2023-06-14 LAB — GLUCOSE, CAPILLARY
Glucose-Capillary: 115 mg/dL — ABNORMAL HIGH (ref 70–99)
Glucose-Capillary: 125 mg/dL — ABNORMAL HIGH (ref 70–99)
Glucose-Capillary: 140 mg/dL — ABNORMAL HIGH (ref 70–99)
Glucose-Capillary: 151 mg/dL — ABNORMAL HIGH (ref 70–99)
Glucose-Capillary: 183 mg/dL — ABNORMAL HIGH (ref 70–99)
Glucose-Capillary: 80 mg/dL (ref 70–99)

## 2023-06-14 LAB — MAGNESIUM: Magnesium: 2.3 mg/dL (ref 1.7–2.4)

## 2023-06-14 LAB — AMMONIA: Ammonia: 24 umol/L (ref 9–35)

## 2023-06-14 LAB — PROCALCITONIN: Procalcitonin: 2.39 ng/mL

## 2023-06-14 MED ORDER — LEVETIRACETAM 500 MG PO TABS
500.0000 mg | ORAL_TABLET | Freq: Two times a day (BID) | ORAL | Status: DC
  Administered 2023-06-14 – 2023-06-16 (×5): 500 mg
  Filled 2023-06-14 (×5): qty 1

## 2023-06-14 MED ORDER — FREE WATER
200.0000 mL | Status: DC
  Administered 2023-06-14 – 2023-06-16 (×12): 200 mL

## 2023-06-14 MED ORDER — HYDRALAZINE HCL 50 MG PO TABS
75.0000 mg | ORAL_TABLET | Freq: Three times a day (TID) | ORAL | Status: DC
  Administered 2023-06-14 – 2023-06-15 (×3): 75 mg
  Filled 2023-06-14 (×3): qty 1

## 2023-06-14 MED ORDER — GERHARDT'S BUTT CREAM
TOPICAL_CREAM | Freq: Four times a day (QID) | CUTANEOUS | Status: DC
  Administered 2023-06-28: 1 via TOPICAL
  Filled 2023-06-14 (×4): qty 1

## 2023-06-14 MED ORDER — POTASSIUM CHLORIDE 20 MEQ PO PACK
20.0000 meq | PACK | Freq: Once | ORAL | Status: AC
  Administered 2023-06-14: 20 meq via ORAL
  Filled 2023-06-14: qty 1

## 2023-06-14 NOTE — Progress Notes (Signed)
EEG complete - results pending 

## 2023-06-14 NOTE — Progress Notes (Signed)
NAME:  [name], MRN:  [id number], DOB:  Aug 07, 1946, LOS: 10 ADMISSION DATE:  06/04/2023, CONSULTATION DATE:  06/04/23 REFERRING MD:  Ray-ED, CHIEF COMPLAINT:  hypoxia   History of Present Illness:  [name] is a 77 year old woman with a history of recurrent pneumonias,, vitamin 20 B12 deficiency, stroke, seizures, hypertension, GERD, diabetes, DVT who presented to the hospital with shortness of breath and cough.  She has been admitted 4 times previously this year.  She woke up from sleep and respiratory distress.  With EMS she had oxygen saturations in the 70s on room air and despite nonrebreather only rose to mid 80s.  EMS administered DuoNebs, Solu-Medrol, magnesium.  She was placed on BiPAP in the emergency department.  She was administered vancomycin and cefepime after 1 blood culture was collected.  She failed to significantly improve on BiPAP.  She is not on inhalers at home.  Pertinent  Medical History  Hypertension Diabetes Seizures GERD B12 deficiency Stroke Sleep apnea Chronic pancreatitis CAD Colostomy  Significant Hospital Events: Including procedures, antibiotic start and stop dates in addition to other pertinent events   8/21 admitted, intubated 8/22 Remains on vent with some issues with agitation, added Precedex drip  8/24 agitated on trying to reduce sedation 8/25-agitation on reducing sedation 8/26 ABG with hypercapnia and hypercarbia, fentanyl increased and propofol maintained for better sedation , very dyssynchronous to vent.  Switched from precedex to propofol 8/27 poorly responsive with tachycardia, HTN and vent dyssynchrony, tolerated PSV better  8/28 sedation stopped/ extubated, hypertensive started on cleviprex 8/29 remains stable postextubation, mentation improving and off Precedex.  Diuresing well 8/30 continues to remain significantly encephalopathic, withdraws to verbal stimuli but will not follow any commands 8/31 no acute issues overnight, remains  encephalopathic but slightly more alert this a.m.   Interim History / Subjective:  Arouses to verbal stimuli easier today.  Able to state name  Objective   Blood pressure (!) 135/47, pulse 95, temperature 98.3 F (36.8 C), temperature source Axillary, resp. rate 18, height 5\' 3"  (1.6 m), weight 78 kg, SpO2 100%.        Intake/Output Summary (Last 24 hours) at 06/14/2023 0846 Last data filed at 06/14/2023 0700 Gross per 24 hour  Intake 1886.04 ml  Output 2700 ml  Net -813.96 ml   Filed Weights   06/13/23 0500 06/13/23 2100 06/14/23 0158  Weight: 83 kg 78 kg 78 kg   Examination: General: Acute on chronic ill-appearing deconditioned elderly female lying in bed on mechanical ventilation no acute distress HEENT: [place]/AT, MM pink/moist, PERRL,  Neuro: Will arouse to verbal stimuli, attempts to state name, nonfocal CV: s1s2 regular rate and rhythm, no murmur, rubs, or gallops,  PULM: Faint bilateral rhonchi, no increased work of breathing, no added breath sounds, on 4L [place] GI: soft, bowel sounds active in all 4 quadrants, non-tender, non-distended, tolerating TF Extremities: warm/dry, no edema  Skin: no rashes or lesions   Resolved Hospital Problem list   Severe agitation and vent dyssynchrony, resolved Sepsis due to community-acquired pneumonia - completed day 7 of abx 8/27.  Cultures neg from 8/21 NAGMA, resolved, felt related to AKI/ GI losses w/diarrhea Right lower lobe and middle lobe pneumonia -Completed 7 days of antibiotics 8/27  Assessment & Plan:   Acute respiratory failure with hypoxia Hx OSA not on CPAP Former smoker (pack years 50) P: At risk for reintubation, continue close monitoring in the ICU Aspiration precautions N.p.o. until mentation improves Continue tube feeds via cortrak Aggressive pulmonary  hygiene as able Management of uremia as below As needed bronchodilators  Acute metabolic encephalopathy, ? ICU hypodelirium Uremia -BUN remains elevated at  98 Hx of seizures P: Neuroprotective measures Nutrition and bowel regiment Seizure precautions Aspiration precautions Continue Keppra Stop clonidine  AKI on CKD Stage IV, improving  -Creatinine remained stable at 1.52 however BUN remains elevated at 98 Hypernatremia P: Follow renal function Monitor urine output Trend bmet Start free water flushes Ensure adequate renal perfusion  Hyperglycemia, stable P: Continue SSI and tube feed coverage CBG checks every 4 CBG goal 140-180  Acute on chronic anemia - felt to be likely dilutional initially but Hgb trended down, BUN up. P: Trend CBC Transfuse per protocol Hold DVT prophylaxis given downtrending hemoglobin and reported faint bright red blood per rectum 8/30  History of coronary artery disease s/p PCI/ stent on 02/12/23 Sinus tachycardia HTN  P: Continue aspirin, statin, and Plavix Optimize electrolytes Continue metoprolol and Norvasc Increase scheduled hydralazine  History of GERD P: PPI  History of pancreatic insufficiency due to chronic pancreatitis Diarrhea  -Unable to push Creon via tube  P: Continue Benicar Bowel regimen as needed  Depression -Effexor held 8/27 due to oversedation P: Supportive care Best Practice (right click and "Reselect all SmartList Selections" daily)   Diet/type: tubefeeds/ NPO DVT prophylaxis: prophylactic heparin > on hold, SCDs only for now GI prophylaxis: PPI Lines: N/A Foley:  Yes, and it is no longer needed and removal ordered  Code Status:  full code Last date of multidisciplinary goals of care discussion: No family at bedside am 8/30  Roxan Yamamoto D. Harris, NP-C [facility] Personal contact information can be found on Amion  If no contact or response made please call [phone] 06/14/2023, 8:46 AM

## 2023-06-14 NOTE — Plan of Care (Signed)
  Problem: Education: Goal: Knowledge of General Education information will improve Description: Including pain rating scale, medication(s)/side effects and non-pharmacologic comfort measures Outcome: Not Progressing   Problem: Health Behavior/Discharge Planning: Goal: Ability to manage health-related needs will improve Outcome: Not Progressing   Problem: Clinical Measurements: Goal: Ability to maintain clinical measurements within normal limits will improve Outcome: Progressing Goal: Will remain free from infection Outcome: Progressing Goal: Respiratory complications will improve Outcome: Progressing   Problem: Activity: Goal: Risk for activity intolerance will decrease Outcome: Not Progressing   Problem: Nutrition: Goal: Adequate nutrition will be maintained Outcome: Progressing

## 2023-06-14 NOTE — Progress Notes (Signed)
PT Cancellation Note  Patient Details Name: [name] MRN: [id number] DOB: [birth year]   Cancelled Treatment:    Reason Eval/Treat Not Completed: Patient not medically ready.  Per RN, still obtunded and not appropriate for therapy.  Will try again 9/1 as appropriate.    Eliseo Gum Nohemi Nicklaus 06/14/2023, 4:36 PM

## 2023-06-15 DIAGNOSIS — R4182 Altered mental status, unspecified: Secondary | ICD-10-CM

## 2023-06-15 DIAGNOSIS — J9601 Acute respiratory failure with hypoxia: Secondary | ICD-10-CM | POA: Diagnosis not present

## 2023-06-15 LAB — BASIC METABOLIC PANEL
Anion gap: 12 (ref 5–15)
BUN: 83 mg/dL — ABNORMAL HIGH (ref 8–23)
CO2: 25 mmol/L (ref 22–32)
Calcium: 8.9 mg/dL (ref 8.9–10.3)
Chloride: 109 mmol/L (ref 98–111)
Creatinine, Ser: 1.36 mg/dL — ABNORMAL HIGH (ref 0.44–1.00)
GFR, Estimated: 40 mL/min — ABNORMAL LOW (ref >60)
Glucose, Bld: 220 mg/dL — ABNORMAL HIGH (ref 70–99)
Potassium: 3.6 mmol/L (ref 3.5–5.1)
Sodium: 146 mmol/L — ABNORMAL HIGH (ref 135–145)

## 2023-06-15 LAB — CBC
HCT: 22.7 % — ABNORMAL LOW (ref 36.0–46.0)
Hemoglobin: 6.9 g/dL — CL (ref 12.0–15.0)
MCH: 29.6 pg (ref 26.0–34.0)
MCHC: 30.4 g/dL (ref 30.0–36.0)
MCV: 97.4 fL (ref 80.0–100.0)
Platelets: 288 10*3/uL (ref 150–400)
RBC: 2.33 MIL/uL — ABNORMAL LOW (ref 3.87–5.11)
RDW: 15.9 % — ABNORMAL HIGH (ref 11.5–15.5)
WBC: 27.3 10*3/uL — ABNORMAL HIGH (ref 4.0–10.5)
nRBC: 0.6 % — ABNORMAL HIGH (ref 0.0–0.2)

## 2023-06-15 LAB — GLUCOSE, CAPILLARY
Glucose-Capillary: 149 mg/dL — ABNORMAL HIGH (ref 70–99)
Glucose-Capillary: 179 mg/dL — ABNORMAL HIGH (ref 70–99)
Glucose-Capillary: 210 mg/dL — ABNORMAL HIGH (ref 70–99)
Glucose-Capillary: 84 mg/dL (ref 70–99)
Glucose-Capillary: 94 mg/dL (ref 70–99)
Glucose-Capillary: 99 mg/dL (ref 70–99)

## 2023-06-15 LAB — PREPARE RBC (CROSSMATCH)

## 2023-06-15 LAB — PROCALCITONIN: Procalcitonin: 1.61 ng/mL

## 2023-06-15 MED ORDER — HYDRALAZINE HCL 50 MG PO TABS
50.0000 mg | ORAL_TABLET | Freq: Three times a day (TID) | ORAL | Status: DC
  Administered 2023-06-15 – 2023-06-16 (×3): 50 mg
  Filled 2023-06-15 (×3): qty 1

## 2023-06-15 MED ORDER — MELATONIN 3 MG PO TABS
3.0000 mg | ORAL_TABLET | Freq: Every day | ORAL | Status: DC
  Administered 2023-06-15: 3 mg
  Filled 2023-06-15: qty 1

## 2023-06-15 MED ORDER — ACETAMINOPHEN 325 MG PO TABS
650.0000 mg | ORAL_TABLET | Freq: Four times a day (QID) | ORAL | Status: DC | PRN
  Administered 2023-06-16 – 2023-06-29 (×5): 650 mg via ORAL
  Filled 2023-06-15 (×5): qty 2

## 2023-06-15 MED ORDER — SODIUM CHLORIDE 0.9% IV SOLUTION: Freq: Once | INTRAVENOUS | Status: AC

## 2023-06-15 MED ORDER — ACETAMINOPHEN 160 MG/5ML PO SOLN
650.0000 mg | Freq: Four times a day (QID) | ORAL | Status: DC | PRN
Start: 2023-06-15 — End: 2023-06-15

## 2023-06-15 NOTE — Evaluation (Signed)
Clinical/Bedside Swallow Evaluation Patient Details  Name: [name] MRN: [id number] Date of Birth: [birth year]  Today's Date: 06/15/2023 Time: SLP Start Time (ACUTE ONLY): 1401 SLP Stop Time (ACUTE ONLY): 1416 SLP Time Calculation (min) (ACUTE ONLY): 15 min  Past Medical History:  Past Medical History:  Diagnosis Date   Anxiety    Arthritis    back    Blind loop syndrome    Clotting disorder (HCC)    Hx Clot in leg per pt    Colostomy present (HCC)    Coronary artery disease    a. known 60-70% mid-LAD stenosis with caths in 1999, 2002, 2006, and 2012 showing stable anatomy b. 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HCC)    Diabetes mellitus    Diverticulosis    Dizziness    chronic   Dyslipidemia    Fatty liver    GERD (gastroesophageal reflux disease)    hiatial hernia   Heart murmur    Hiatal hernia    Hypertension    Irritable bowel syndrome    Pancreatitis, chronic (HCC)    Peripheral vascular disease (HCC) 02/20/2010   s/p stent of left SFA; carotid stenosis   Seizure (HCC) 11/04/2018   last 1 yr 2020 per pt unsure month    Sleep apnea    no cpap    Stroke (HCC)    Thyroid nodule    Vitamin B12 deficiency    Past Surgical History:  Past Surgical History:  Procedure Laterality Date   ABDOMINAL HYSTERECTOMY     partial   AUGMENTATION MAMMAPLASTY     bilateral 1976 retro    BREAST ENHANCEMENT SURGERY     CARDIAC CATHETERIZATION  07/07/98;08/31/01;03/04/05   60 -70% LAD   CATARACT EXTRACTION Bilateral    COLON RESECTION N/A 10/14/2017   Procedure: EXPLORATORY LAPAROTOMY, EXTENDED RIGHT COLECTOMY; APPLICATION OF ABDOMINAL VACUUM DRESSING;  Surgeon: Griselda Miner, MD;  Location: [facility];  Service: General;  Laterality: N/A;   COLONOSCOPY     CORONARY ATHERECTOMY N/A 02/12/2023   Procedure: CORONARY ATHERECTOMY;  Surgeon: Swaziland, Peter M, MD;  Location: [facility] INVASIVE CV LAB;  Service: Cardiovascular;  Laterality: N/A;   CORONARY STENT  INTERVENTION N/A 02/12/2023   Procedure: CORONARY STENT INTERVENTION;  Surgeon: Swaziland, Peter M, MD;  Location: [facility] INVASIVE CV LAB;  Service: Cardiovascular;  Laterality: N/A;   CORONARY ULTRASOUND/IVUS N/A 02/12/2023   Procedure: Coronary Ultrasound/IVUS;  Surgeon: Swaziland, Peter M, MD;  Location: [facility] INVASIVE CV LAB;  Service: Cardiovascular;  Laterality: N/A;   ENDOBRONCHIAL ULTRASOUND Bilateral 02/24/2017   Procedure: ENDOBRONCHIAL ULTRASOUND;  Surgeon: Leslye Peer, MD;  Location: [facility] ENDOSCOPY;  Service: Cardiopulmonary;  Laterality: Bilateral;   FEMORAL ARTERY STENT     Left leg   LAPAROTOMY N/A 10/16/2017   Procedure: EXPLORATORY LAPAROTOMY, PARTIAL OMENTECTOMY, RESECTION ISCHEMIC ILEUM 130CM, APPLICATION OF VAC ABDOMINAL DRESSING;  Surgeon: Darnell Level, MD;  Location: [facility];  Service: General;  Laterality: N/A;   LAPAROTOMY N/A 10/18/2017   Procedure: RE-EXPLORATION OF ABDOMEN, ILEOSTOMY CREATION;  Surgeon: Almond Lint, MD;  Location: [facility];  Service: General;  Laterality: N/A;   LEFT HEART CATH AND CORONARY ANGIOGRAPHY N/A 01/29/2023   Procedure: LEFT HEART CATH AND CORONARY ANGIOGRAPHY;  Surgeon: Swaziland, Peter M, MD;  Location: [facility] INVASIVE CV LAB;  Service: Cardiovascular;  Laterality: N/A;   LUNG SURGERY  03/2017   Benign polyps removed   PATELLA REALIGNMENT Left    POLYPECTOMY  SIGMOIDOSCOPY     TONSILLECTOMY     TRANSCAROTID ARTERY REVASCULARIZATION  Right 04/18/2022   Procedure: Transcarotid Artery Revascularization;  Surgeon: Victorino Sparrow, MD;  Location: [facility] OR;  Service: Vascular;  Laterality: Right;   HPI:  Pt is a 77 yo female presenting 8/21 with shortness of breath, cough, hypoxia. ETT 8/21-8/28. Pt was seen by SLP most recently in July 2023, recommending regular solids and thin liquids but noting lingual abnormalities and reduced ROM secondary to lingual trauma). Esophagram (November 2021) showed penetration of thin liquids, sensed aspiration with thicker barium,  mild esophageal dysmotility, and HH with mild narrowing of the GE junction.   FEES at outside hospital in 2019 [facility]. Imaging from [facility] in 2019 reviewed (report not available) with transient penetration and vallecular residue noted. PMH includes: GERD, stroke, recurrent PNA, seizures, sleep apnea, DM, HTN, DVT, B12 deficiency, CAD, colostomy, chronic pancreatitis    Assessment / Plan / Recommendation  Clinical Impression  Pt has chronic lingual abnormalities with reduced ROM, likely contributing to mild oral residue with purees. Her voice is mildly hoarse, being several days post-extubation but having been intubated for about a week. She has intermittent coughing with thin liquids, which could be concerning for decreased aspiration given both acute and chronic risk factors. She had aspiration on most recent esophagram and presents with multiple episodes of PNA this year. Recommend considering instrumental swallow study prior to initiation of PO diet. Would allow ice chips after oral care until this can be completed. SLP Visit Diagnosis: Dysphagia, unspecified (R13.10)    Aspiration Risk  Moderate aspiration risk    Diet Recommendation Ice chips PRN after oral care    Medication Administration: Via alternative means (could give crushed in puree if needed)    Other  Recommendations Oral Care Recommendations: Oral care QID    Recommendations for follow up therapy are one component of a multi-disciplinary discharge planning process, led by the attending physician.  Recommendations may be updated based on patient status, additional functional criteria and insurance authorization.  Follow up Recommendations  (tba)      Assistance Recommended at Discharge    Functional Status Assessment    Frequency and Duration            Prognosis        Swallow Study   General HPI: Pt is a 77 yo female presenting 8/21 with shortness of breath, cough, hypoxia. ETT 8/21-8/28. Pt was seen by SLP most recently  in July 2023, recommending regular solids and thin liquids but noting lingual abnormalities and reduced ROM secondary to lingual trauma). Esophagram (November 2021) showed penetration of thin liquids, sensed aspiration with thicker barium, mild esophageal dysmotility, and HH with mild narrowing of the GE junction.   FEES at outside hospital in 2019 [facility]. Imaging from [facility] in 2019 reviewed (report not available) with transient penetration and vallecular residue noted. PMH includes: GERD, stroke, recurrent PNA, seizures, sleep apnea, DM, HTN, DVT, B12 deficiency, CAD, colostomy, chronic pancreatitis Type of Study: Bedside Swallow Evaluation Previous Swallow Assessment: see HPI Diet Prior to this Study: NPO;Cortrak/Small bore NG tube Temperature Spikes Noted: No Respiratory Status: Nasal cannula History of Recent Intubation: Yes Total duration of intubation (days): 7 days Date extubated: 06/11/23 Behavior/Cognition: Alert;Cooperative;Requires cueing;Pleasant mood Oral Cavity Assessment: Other (comment) (lingual abnormalities/reduced ROM - chronic) Oral Care Completed by SLP: No Oral Cavity - Dentition: Adequate natural dentition Vision: Functional for self-feeding Self-Feeding Abilities: Needs assist Patient Positioning: Upright in bed Baseline Vocal Quality: Hoarse (  mild) Volitional Cough: Weak Volitional Swallow: Unable to elicit    Oral/Motor/Sensory Function Overall Oral Motor/Sensory Function: Generalized oral weakness (chronic lingual changes)   Ice Chips Ice chips: Impaired Presentation: Spoon Pharyngeal Phase Impairments: Cough - Immediate (x1)   Thin Liquid Thin Liquid: Impaired Presentation: Cup;Self Fed;Straw Pharyngeal  Phase Impairments: Cough - Immediate    Nectar Thick Nectar Thick Liquid: Not tested   Honey Thick Honey Thick Liquid: Not tested   Puree Puree: Impaired Presentation: Spoon Oral Phase Functional Implications: Oral residue   Solid     Solid: Not tested       Mahala Menghini., M.A. CCC-SLP Acute Rehabilitation Services Office [phone]  Secure chat preferred  06/15/2023,2:49 PM

## 2023-06-15 NOTE — Procedures (Signed)
Routine EEG Report  [name] is a 77 y.o. female with a history of altered mental status who is undergoing an EEG to evaluate for seizures.  Report: This EEG was acquired with electrodes placed according to the International 10-20 electrode system (including Fp1, Fp2, F3, F4, C3, C4, P3, P4, O1, O2, T3, T4, T5, T6, A1, A2, Fz, Cz, Pz). The following electrodes were missing or displaced: none.  The occipital dominant rhythm was 7 Hz. This activity is reactive to stimulation. Drowsiness was manifested by background fragmentation; deeper stages of sleep were identified by K complexes and sleep spindles. There was no focal slowing. There were no interictal epileptiform discharges. There were no electrographic seizures identified. Photic stimulation and hyperventilation were not performed.   Impression and clinical correlation: This EEG was obtained while awake and asleep and is abnormal due to mild diffuse slowing indicative of global cerebral dysfunction. Epileptiform abnormalities were not seen during this recording.  Bing Neighbors, MD [facility] [phone]  If 7pm- 7am, please page neurology on call as listed in AMION.

## 2023-06-15 NOTE — Plan of Care (Signed)
  Problem: Clinical Measurements: Goal: Will remain free from infection Outcome: Progressing Goal: Diagnostic test results will improve Outcome: Progressing   Problem: Education: Goal: Knowledge of General Education information will improve Description: Including pain rating scale, medication(s)/side effects and non-pharmacologic comfort measures Outcome: Not Progressing   Problem: Health Behavior/Discharge Planning: Goal: Ability to manage health-related needs will improve Outcome: Not Progressing   Problem: Clinical Measurements: Goal: Ability to maintain clinical measurements within normal limits will improve Outcome: Not Progressing   

## 2023-06-15 NOTE — Progress Notes (Signed)
eLink Physician-Brief Progress Note Patient Name: CAMIELLE MILBRATH DOB: [birth year] MRN: [id number]   Date of Service  06/15/2023  HPI/Events of Note  Request for sleep aid Uses NGT  eICU Interventions  Melatonin ordered     Intervention Category Minor Interventions: Routine modifications to care plan (e.g. PRN medications for pain, fever)  Woodrow Dulski Mechele Collin 06/15/2023, 8:00 PM

## 2023-06-15 NOTE — Evaluation (Signed)
Physical Therapy Evaluation Patient Details Name: [name] MRN: [id number] DOB: 1945-10-23 Today's Date: 06/15/2023  History of Present Illness  Pt is 77 year old presented to [facility] on  06/04/23 for acute respiratory failure due to PNA. Intubatd on admission. Extubated 8/28. Pt encephaloathic post extubation. 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  Clinical Impression  Pt admitted with above diagnosis and presents to PT with functional limitations due to deficits listed below (See PT problem list). Pt needs skilled PT to maximize independence and safety. Pt lives alone with intermittent support and is currently unable to manage that level of function. Patient will benefit from continued inpatient follow up therapy, <3 hours/day           If plan is discharge home, recommend the following: A lot of help with walking and/or transfers;A lot of help with bathing/dressing/bathroom;Assist for transportation;Help with stairs or ramp for entrance   Can travel by private vehicle   No    Equipment Recommendations Other (comment) (To be determined at next venue)  Recommendations for Other Services       Functional Status Assessment Patient has had a recent decline in their functional status and demonstrates the ability to make significant improvements in function in a reasonable and predictable amount of time.     Precautions / Restrictions Precautions Precautions: Fall      Mobility  Bed Mobility Overal bed mobility: Needs Assistance Bed Mobility: Supine to Sit     Supine to sit: Mod assist, HOB elevated     General bed mobility comments: Assist to bring legs off of bed, elevate trunk into sitting and bring hips to EOB.    Transfers Overall transfer level: Needs assistance Equipment  used: 1 person hand held assist Transfers: Sit to/from Stand, Bed to chair/wheelchair/BSC Sit to Stand: Mod assist   Step pivot transfers: Mod assist       General transfer comment: Bilateral shelf arm support to bring hips up and for balance. Small shuffling steps to step from bed to chair.    Ambulation/Gait                  Stairs            Wheelchair Mobility     Tilt Bed    Modified Rankin (Stroke Patients Only)       Balance Overall balance assessment: Needs assistance Sitting-balance support: Bilateral upper extremity supported, Feet supported Sitting balance-Leahy Scale: Poor Sitting balance - Comments: UE support   Standing balance support: Bilateral upper extremity supported Standing balance-Leahy Scale: Poor Standing balance comment: Bilateral forearm support and min assist for static standing                             Pertinent Vitals/Pain Pain Assessment Pain Assessment: No/denies pain    Home Living Family/patient expects to be discharged to:: Private residence Living Arrangements: Alone Available Help at Discharge: Family;Friend(s);Available PRN/intermittently Type of Home: House Home Access: Stairs to enter Entrance Stairs-Rails: Doctor, general practice of Steps: 2   Home Layout: One level Home Equipment: Pharmacist, hospital (2 wheels) Additional Comments: Son and daughter-in-law live nearby.; Reports typically sleeping on the couch    Prior Function Prior Level of Function : Independent/Modified Independent  Mobility Comments: Using rolling walker       Extremity/Trunk Assessment   Upper Extremity Assessment Upper Extremity Assessment: Defer to OT evaluation    Lower Extremity Assessment Lower Extremity Assessment: Generalized weakness       Communication   Communication Communication: Hearing impairment  Cognition Arousal: Alert Behavior During Therapy: WFL for  tasks assessed/performed Overall Cognitive Status: Impaired/Different from baseline Area of Impairment: Orientation, Attention, Memory, Following commands, Safety/judgement, Awareness, Problem solving                 Orientation Level: Disoriented to, Time, Situation Current Attention Level: Sustained Memory: Decreased recall of precautions, Decreased short-term memory Following Commands: Follows one step commands consistently, Follows one step commands with increased time Safety/Judgement: Decreased awareness of safety, Decreased awareness of deficits Awareness: Intellectual Problem Solving: Slow processing, Requires verbal cues General Comments: Pt repeatedly asking for something to eat or drink despite explaining multiple times why she wasn't allowed to eat        General Comments General comments (skin integrity, edema, etc.): VSS    Exercises     Assessment/Plan    PT Assessment Patient needs continued PT services  PT Problem List Decreased strength;Decreased activity tolerance;Decreased balance;Decreased mobility;Decreased cognition       PT Treatment Interventions DME instruction;Gait training;Functional mobility training;Therapeutic activities;Therapeutic exercise;Balance training;Patient/family education    PT Goals (Current goals can be found in the Care Plan section)  Acute Rehab PT Goals Patient Stated Goal: go home PT Goal Formulation: With patient Time For Goal Achievement: 06/29/23 Potential to Achieve Goals: Fair    Frequency Min 1X/week     Co-evaluation               AM-PAC PT "6 Clicks" Mobility  Outcome Measure Help needed turning from your back to your side while in a flat bed without using bedrails?: A Lot Help needed moving from lying on your back to sitting on the side of a flat bed without using bedrails?: A Lot Help needed moving to and from a bed to a chair (including a wheelchair)?: A Lot Help needed standing up from a chair  using your arms (e.g., wheelchair or bedside chair)?: A Lot Help needed to walk in hospital room?: Total Help needed climbing 3-5 steps with a railing? : Total 6 Click Score: 10    End of Session Equipment Utilized During Treatment: Oxygen Activity Tolerance: Patient limited by fatigue Patient left: in chair;with call bell/phone within reach Nurse Communication: Mobility status;Need for lift equipment Antony Salmon) PT Visit Diagnosis: Unsteadiness on feet (R26.81);Other abnormalities of gait and mobility (R26.89);Muscle weakness (generalized) (M62.81)    Time: 2952-8413 PT Time Calculation (min) (ACUTE ONLY): 31 min   Charges:   PT Evaluation $PT Eval Moderate Complexity: 1 Mod PT Treatments $Therapeutic Activity: 8-22 mins PT General Charges $$ ACUTE PT VISIT: 1 Visit         [facility] PT Acute Rehabilitation Services Office [phone]   Angelina Ok [facility] 06/15/2023, 4:22 PM

## 2023-06-15 NOTE — Progress Notes (Signed)
NAME:  [name], MRN:  [id number], DOB:  08/19/1946, LOS: 11 ADMISSION DATE:  06/04/2023, CONSULTATION DATE:  06/04/23 REFERRING MD:  Ray-ED, CHIEF COMPLAINT:  hypoxia   History of Present Illness:  [name] is a 77 year old woman with a history of recurrent pneumonias,, vitamin 20 B12 deficiency, stroke, seizures, hypertension, GERD, diabetes, DVT who presented to the hospital with shortness of breath and cough.  She has been admitted 4 times previously this year.  She woke up from sleep and respiratory distress.  With EMS she had oxygen saturations in the 70s on room air and despite nonrebreather only rose to mid 80s.  EMS administered DuoNebs, Solu-Medrol, magnesium.  She was placed on BiPAP in the emergency department.  She was administered vancomycin and cefepime after 1 blood culture was collected.  She failed to significantly improve on BiPAP.  She is not on inhalers at home.  Pertinent  Medical History  Hypertension Diabetes Seizures GERD B12 deficiency Stroke Sleep apnea Chronic pancreatitis CAD Colostomy  Significant Hospital Events: Including procedures, antibiotic start and stop dates in addition to other pertinent events   8/21 admitted, intubated 8/22 Remains on vent with some issues with agitation, added Precedex drip  8/24 agitated on trying to reduce sedation 8/25-agitation on reducing sedation 8/26 ABG with hypercapnia and hypercarbia, fentanyl increased and propofol maintained for better sedation , very dyssynchronous to vent.  Switched from precedex to propofol 8/27 poorly responsive with tachycardia, HTN and vent dyssynchrony, tolerated PSV better  8/28 sedation stopped/ extubated, hypertensive started on cleviprex 8/29 remains stable postextubation, mentation improving and off Precedex.  Diuresing well 8/30 continues to remain significantly encephalopathic, withdraws to verbal stimuli but will not follow any commands 8/31 no acute issues overnight, remains  encephalopathic but slightly more alert this a.m 9/1 no acute events overnight, mentation much improved this a.m. alert and oriented x 2   Interim History / Subjective:  States she feels well with reports of thirst  Objective   Blood pressure (!) 138/52, pulse 82, temperature (!) 97.1 F (36.2 C), temperature source Axillary, resp. rate 14, height 5\' 3"  (1.6 m), weight 78 kg, SpO2 97%.        Intake/Output Summary (Last 24 hours) at 06/15/2023 0900 Last data filed at 06/15/2023 0600 Gross per 24 hour  Intake 1900 ml  Output 2135 ml  Net -235 ml   Filed Weights   06/13/23 0500 06/13/23 2100 06/14/23 0158  Weight: 83 kg 78 kg 78 kg   Examination: General: Acute on chronic ill-appearing significantly deconditioned elderly female lying in bed in no acute distress HEENT: ETT, MM pink/moist, PERRL,  Neuro: [place]/AT CV: s1s2 regular rate and rhythm, no murmur, rubs, or gallops,  PULM: Alert and oriented x 3, nonfocal GI: soft, bowel sounds active in all 4 quadrants, non-tender, non-distended, tolerating TF Extremities: warm/dry, no edema  Skin: no rashes or lesions  Resolved Hospital Problem list   Severe agitation and vent dyssynchrony, resolved Sepsis due to community-acquired pneumonia - completed day 7 of abx 8/27.  Cultures neg from 8/21 NAGMA, resolved, felt related to AKI/ GI losses w/diarrhea Right lower lobe and middle lobe pneumonia -Completed 7 days of antibiotics 8/27  Assessment & Plan:   Acute respiratory failure with hypoxia Hx OSA not on CPAP Former smoker (pack years 50) P: Aspiration precautions Aggressive pulmonary hygiene when able As needed bronchodilators Can likely transfer out of ICU given improved mentation   Acute metabolic encephalopathy, ? ICU hypodelirium  -A.m. 9/1  mentation is much improved Uremia -BUN remains elevated at 98 Hx of seizures P: Minimize sedation Per family report clonidine causes excessive sleepiness patient's mentation has  greatly improved after complete discontinuation of this medication.  Avoid this medication in the future Aspiration precautions  AKI on CKD Stage IV, improving  -Creatinine remained stable at 1.52 however BUN remains elevated at 98 Hypernatremia P: Follow bmet Monitor urine output Ensure adequate renal perfusion Avoid nephrotoxins  Hyperglycemia, stable P: Continue SSI and tube feed coverage CBG checks every 4 CBG goal 140-180   Acute on chronic anemia - felt to be likely dilutional initially but Hgb trended down, BUN up. -Scant bright red blood per rectum seen 8/1:30 dose of subcu heparin P: Trend CBC Transfuse per protocol Consider resumption of DVT prophylaxis tomorrow  History of coronary artery disease s/p PCI/ stent on 02/12/23 Sinus tachycardia HTN  P: Continue aspirin, statin, and Plavix Optimize electrolytes Continue home Lopressor and Norvasc Continue scheduled hydralazine  History of GERD P: PPI   History of pancreatic insufficiency due to chronic pancreatitis Diarrhea  -Unable to push Creon via tube  P: Continue Benicar Bowel regiment Once able to safely consume oral diet resume home Creon  Depression -Effexor held 8/27 due to oversedation P: Supportive care  Best Practice (right click and "Reselect all SmartList Selections" daily)   Diet/type: tubefeeds/ NPO DVT prophylaxis: prophylactic heparin > on hold, SCDs only for now GI prophylaxis: PPI Lines: N/A Foley:  Yes, and it is no longer needed and removal ordered  Code Status:  full code Last date of multidisciplinary goals of care discussion: No family at bedside am 9/1   Kaelen Brennan D. Harris, NP-C [facility] Personal contact information can be found on Amion  If no contact or response made please call [phone] 06/15/2023, 9:00 AM

## 2023-06-15 NOTE — Plan of Care (Signed)

## 2023-06-15 NOTE — Progress Notes (Signed)
eLink Physician-Brief Progress Note Patient Name: [name] DOB: 1946/06/14 MRN: [id number]   Date of Service  06/15/2023  HPI/Events of Note  PT complaining of mild pain  eICU Interventions  Placed order fir PRN tyleno, Will monitor response        [name] M DELA CRUZ 06/15/2023, 5:27 AM

## 2023-06-16 ENCOUNTER — Inpatient Hospital Stay (HOSPITAL_COMMUNITY): Payer: Medicare HMO

## 2023-06-16 DIAGNOSIS — J9601 Acute respiratory failure with hypoxia: Secondary | ICD-10-CM | POA: Diagnosis not present

## 2023-06-16 LAB — CBC
HCT: 25.6 % — ABNORMAL LOW (ref 36.0–46.0)
Hemoglobin: 7.9 g/dL — ABNORMAL LOW (ref 12.0–15.0)
MCH: 29.6 pg (ref 26.0–34.0)
MCHC: 30.9 g/dL (ref 30.0–36.0)
MCV: 95.9 fL (ref 80.0–100.0)
Platelets: 257 10*3/uL (ref 150–400)
RBC: 2.67 MIL/uL — ABNORMAL LOW (ref 3.87–5.11)
RDW: 16.1 % — ABNORMAL HIGH (ref 11.5–15.5)
WBC: 19.7 10*3/uL — ABNORMAL HIGH (ref 4.0–10.5)
nRBC: 0.3 % — ABNORMAL HIGH (ref 0.0–0.2)

## 2023-06-16 LAB — BASIC METABOLIC PANEL
Anion gap: 13 (ref 5–15)
BUN: 83 mg/dL — ABNORMAL HIGH (ref 8–23)
CO2: 24 mmol/L (ref 22–32)
Calcium: 9.4 mg/dL (ref 8.9–10.3)
Chloride: 110 mmol/L (ref 98–111)
Creatinine, Ser: 1.35 mg/dL — ABNORMAL HIGH (ref 0.44–1.00)
GFR, Estimated: 41 mL/min — ABNORMAL LOW (ref >60)
Glucose, Bld: 118 mg/dL — ABNORMAL HIGH (ref 70–99)
Potassium: 3.5 mmol/L (ref 3.5–5.1)
Sodium: 147 mmol/L — ABNORMAL HIGH (ref 135–145)

## 2023-06-16 LAB — GLUCOSE, CAPILLARY
Glucose-Capillary: 115 mg/dL — ABNORMAL HIGH (ref 70–99)
Glucose-Capillary: 122 mg/dL — ABNORMAL HIGH (ref 70–99)
Glucose-Capillary: 141 mg/dL — ABNORMAL HIGH (ref 70–99)
Glucose-Capillary: 149 mg/dL — ABNORMAL HIGH (ref 70–99)
Glucose-Capillary: 177 mg/dL — ABNORMAL HIGH (ref 70–99)
Glucose-Capillary: 70 mg/dL (ref 70–99)

## 2023-06-16 LAB — TSH: TSH: 3.245 u[IU]/mL (ref 0.350–4.500)

## 2023-06-16 MED ORDER — LEVETIRACETAM 500 MG PO TABS
500.0000 mg | ORAL_TABLET | Freq: Two times a day (BID) | ORAL | Status: DC
  Administered 2023-06-16 – 2023-07-03 (×33): 500 mg via ORAL
  Filled 2023-06-16 (×34): qty 1

## 2023-06-16 MED ORDER — SENNOSIDES-DOCUSATE SODIUM 8.6-50 MG PO TABS: 1.0000 | ORAL_TABLET | Freq: Every evening | ORAL | Status: DC | PRN

## 2023-06-16 MED ORDER — BANATROL TF EN LIQD
60.0000 mL | Freq: Two times a day (BID) | ENTERAL | Status: DC
  Administered 2023-06-16 – 2023-06-17 (×2): 60 mL via ORAL
  Filled 2023-06-16 (×2): qty 60

## 2023-06-16 MED ORDER — HYDRALAZINE HCL 50 MG PO TABS
50.0000 mg | ORAL_TABLET | Freq: Three times a day (TID) | ORAL | Status: DC
  Administered 2023-06-16 – 2023-06-23 (×17): 50 mg via ORAL
  Filled 2023-06-16 (×19): qty 1

## 2023-06-16 MED ORDER — MELATONIN 3 MG PO TABS
3.0000 mg | ORAL_TABLET | Freq: Every day | ORAL | Status: DC
  Administered 2023-06-16 – 2023-07-02 (×16): 3 mg via ORAL
  Filled 2023-06-16 (×17): qty 1

## 2023-06-16 MED ORDER — POLYETHYLENE GLYCOL 3350 17 G PO PACK: 17.0000 g | PACK | Freq: Every day | ORAL | Status: DC | PRN

## 2023-06-16 MED ORDER — FREE WATER
200.0000 mL | Status: DC
  Administered 2023-06-16 – 2023-06-17 (×12): 200 mL

## 2023-06-16 MED ORDER — POTASSIUM CHLORIDE 20 MEQ PO PACK
40.0000 meq | PACK | Freq: Once | ORAL | Status: AC
  Administered 2023-06-16: 40 meq
  Filled 2023-06-16: qty 2

## 2023-06-16 MED ORDER — ATORVASTATIN CALCIUM 40 MG PO TABS
40.0000 mg | ORAL_TABLET | Freq: Every day | ORAL | Status: DC
  Administered 2023-06-17 – 2023-07-03 (×17): 40 mg via ORAL
  Filled 2023-06-16 (×17): qty 1

## 2023-06-16 MED ORDER — CLOPIDOGREL BISULFATE 75 MG PO TABS
75.0000 mg | ORAL_TABLET | Freq: Every day | ORAL | Status: DC
  Administered 2023-06-17 – 2023-06-23 (×7): 75 mg via ORAL
  Filled 2023-06-16 (×7): qty 1

## 2023-06-16 MED ORDER — AMLODIPINE BESYLATE 10 MG PO TABS
10.0000 mg | ORAL_TABLET | Freq: Every day | ORAL | Status: DC
  Administered 2023-06-17 – 2023-06-23 (×7): 10 mg via ORAL
  Filled 2023-06-16 (×7): qty 1

## 2023-06-16 MED ORDER — DOCUSATE SODIUM 50 MG/5ML PO LIQD: 100.0000 mg | Freq: Two times a day (BID) | ORAL | Status: DC | PRN

## 2023-06-16 MED ORDER — METOPROLOL TARTRATE 5 MG/5ML IV SOLN: 5.0000 mg | INTRAVENOUS | Status: DC | PRN

## 2023-06-16 MED ORDER — ASPIRIN 81 MG PO CHEW
81.0000 mg | CHEWABLE_TABLET | Freq: Every day | ORAL | Status: DC
  Administered 2023-06-17 – 2023-06-30 (×14): 81 mg via ORAL
  Filled 2023-06-16 (×14): qty 1

## 2023-06-16 MED ORDER — METOPROLOL TARTRATE 100 MG PO TABS
100.0000 mg | ORAL_TABLET | Freq: Two times a day (BID) | ORAL | Status: DC
  Administered 2023-06-16 – 2023-07-03 (×33): 100 mg via ORAL
  Filled 2023-06-16 (×20): qty 1
  Filled 2023-06-16: qty 2
  Filled 2023-06-16 (×3): qty 1
  Filled 2023-06-16: qty 2
  Filled 2023-06-16 (×8): qty 1
  Filled 2023-06-16: qty 2
  Filled 2023-06-16: qty 1

## 2023-06-16 NOTE — Progress Notes (Signed)
eLink Physician-Brief Progress Note Patient Name: DANIELY AHUJA DOB: 01-23-1946 MRN: [id number]   Date of Service  06/16/2023  HPI/Events of Note  78 year old with history of recurrent pneumonia, vitamin B12 deficiency, CVA, seizure, HTN, GERD, diabetes, DVT admitted for shortness of breath.  Now has hypoxic respiratory failure with sepsis secondary to community-acquired pneumonia.  Continues to have watery stools with ineffective fecal management system.  Skin is raw and barrier creams are applied.  Not on any bowel management drugs.  eICU Interventions  If C. difficile testing is negative, can initiate Imodium.   0126 - Delirious and having insomnia. Melatonin ineffective. Will trial trazodone.   Intervention Category Minor Interventions: Clinical assessment - ordering diagnostic tests  Larine Fielding 06/16/2023, 11:04 PM

## 2023-06-16 NOTE — Evaluation (Signed)
Occupational Therapy Evaluation Patient Details Name: [name] MRN: [id number] DOB: 1945/11/28 Today's Date: 06/16/2023   History of Present Illness Pt is 77 year old presented to [facility] on  06/04/23 for acute respiratory failure due to PNA. Intubatd on admission. Extubated 8/28. Pt encephaloathic post extubation. 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   Clinical Impression   PTA, pt lived alone and was independent in ADL and son (who lives nearby) assisted with IADL. Upon eval, pt with poor activity tolerance, safety, endurance, strength, and balance. Pt constantly asking about eating as she had swallow study just before start of session. Pt performing basic transfers with min A +2 and LB ADL with mod A. Pt needing cues for safety and problem solving throughout. Fair awareness, reporting she fatigues too easily to be able to go home. Patient will benefit from continued inpatient follow up therapy, <3 hours/day       If plan is discharge home, recommend the following: Two people to help with walking and/or transfers;A lot of help with bathing/dressing/bathroom;Assistance with cooking/housework;Direct supervision/assist for medications management;Direct supervision/assist for financial management;Assist for transportation;Help with stairs or ramp for entrance    Functional Status Assessment  Patient has had a recent decline in their functional status and demonstrates the ability to make significant improvements in function in a reasonable and predictable amount of time.  Equipment Recommendations  Other (comment) (defer)    Recommendations for Other Services       Precautions / Restrictions Precautions Precautions: Fall Restrictions Weight Bearing Restrictions: No      Mobility Bed  Mobility Overal bed mobility: Needs Assistance Bed Mobility: Supine to Sit     Supine to sit: Contact guard     General bed mobility comments: close guard for safety    Transfers Overall transfer level: Needs assistance Equipment used: 2 person hand held assist Transfers: Sit to/from Stand, Bed to chair/wheelchair/BSC Sit to Stand: Min assist, +2 safety/equipment     Step pivot transfers: Min assist     General transfer comment: poor activity tolerance; needing to sit quickly. Mildly dizzy in standing with noted elevated BP      Balance Overall balance assessment: Needs assistance Sitting-balance support: Bilateral upper extremity supported, Feet supported Sitting balance-Leahy Scale: Fair Sitting balance - Comments: statically   Standing balance support: Bilateral upper extremity supported Standing balance-Leahy Scale: Poor Standing balance comment: Bilateral forearm support and min assist for static standing                           ADL either performed or assessed with clinical judgement   ADL Overall ADL's : Needs assistance/impaired   Eating/Feeding Details (indicate cue type and reason): just had swallow study this morning, awaiting formal diet upgrade/update Grooming: Sitting;Contact guard assist   Upper Body Bathing: Contact guard assist;Sitting   Lower Body Bathing: Moderate assistance;Sit to/from stand   Upper Body Dressing : Contact guard assist;Sitting   Lower Body Dressing: Moderate assistance;Sit to/from stand   Toilet Transfer: Minimal assistance;+2 for safety/equipment;+2 for physical assistance           Functional mobility during ADLs: Minimal assistance;+2 for safety/equipment;+2 for physical assistance       Vision Ability to See in Adequate Light: 0 Adequate Patient Visual Report: No change from baseline Vision  Assessment?: No apparent visual deficits Additional Comments: WFL for tasks assessed     Perception  Perception: Not tested       Praxis Praxis: Not tested       Pertinent Vitals/Pain Pain Assessment Pain Assessment: Faces Faces Pain Scale: No hurt Pain Intervention(s): Limited activity within patient's tolerance, Monitored during session     Extremity/Trunk Assessment Upper Extremity Assessment Upper Extremity Assessment: Generalized weakness   Lower Extremity Assessment Lower Extremity Assessment: Defer to PT evaluation       Communication Communication Communication: Hearing impairment Cueing Techniques: Verbal cues;Gestural cues   Cognition Arousal: Alert Behavior During Therapy: WFL for tasks assessed/performed Overall Cognitive Status: Impaired/Different from baseline Area of Impairment: Attention, Memory, Following commands, Safety/judgement, Awareness, Problem solving                   Current Attention Level: Sustained Memory: Decreased recall of precautions, Decreased short-term memory Following Commands: Follows one step commands consistently, Follows one step commands with increased time Safety/Judgement: Decreased awareness of safety, Decreased awareness of deficits Awareness: Emergent Problem Solving: Slow processing, Requires verbal cues General Comments: Pt repeatedly asking for something to eat or drink despite explaining multiple times why she wasn't allowed to eat. Following all commands with increased time. Emergent awareness; "do you think you could walk around the room after having transferred to the chair" and pt reporting no, I'd give out     General Comments  BP 167/79 (104) in standing; return to seated and 149/54 (83).    Exercises     Shoulder Instructions      Home Living Family/patient expects to be discharged to:: Private residence Living Arrangements: Alone Available Help at Discharge: Family;Friend(s);Available PRN/intermittently Type of Home: House Home Access: Stairs to enter [employer] of Steps: 2 Entrance  Stairs-Rails: Right;Left Home Layout: One level     Bathroom Shower/Tub: Chief Strategy Officer: Standard Bathroom Accessibility: Yes   Home Equipment: Agricultural consultant (2 wheels);Tub bench   Additional Comments: Son and daughter-in-law live nearby.; Reports typically sleeping on the couch      Prior Functioning/Environment Prior Level of Function : Independent/Modified Independent;Needs assist             Mobility Comments: Using rolling walker ADLs Comments: independent with adls and light iadls; family provides assist with medication management and recently son has been driving pt to grocery store        OT Problem List: Decreased strength;Decreased activity tolerance;Impaired balance (sitting and/or standing);Decreased cognition;Cardiopulmonary status limiting activity      OT Treatment/Interventions: Self-care/ADL training;Therapeutic exercise;DME and/or AE instruction;Balance training;Patient/family education;Therapeutic activities;Cognitive remediation/compensation    OT Goals(Current goals can be found in the care plan section) Acute Rehab OT Goals Patient Stated Goal: get better OT Goal Formulation: With patient Time For Goal Achievement: 06/30/23 Potential to Achieve Goals: Good  OT Frequency: Min 2X/week    Co-evaluation              AM-PAC OT "6 Clicks" Daily Activity     Outcome Measure Help from another person eating meals?: Total Help from another person taking care of personal grooming?: A Little Help from another person toileting, which includes using toliet, bedpan, or urinal?: A Lot Help from another person bathing (including washing, rinsing, drying)?: A Lot Help from another person to put on and taking off regular upper body clothing?: A Little Help from another person to put on and taking off regular lower body clothing?: A Lot 6 Click  Score: 13   End of Session Equipment Utilized During Treatment: Gait belt Nurse Communication:  Mobility status  Activity Tolerance: Patient tolerated treatment well Patient left: in chair;with call bell/phone within reach;with chair alarm set  OT Visit Diagnosis: Unsteadiness on feet (R26.81);Muscle weakness (generalized) (M62.81);Other symptoms and signs involving cognitive function                Time: 1100-1122 OT Time Calculation (min): 22 min Charges:  OT General Charges $OT Visit: 1 Visit OT Evaluation $OT Eval Moderate Complexity: 1 Mod  [name], OTR/L [facility] Acute Rehabilitation Office: [phone]   [name] 06/16/2023, 12:05 PM

## 2023-06-16 NOTE — Plan of Care (Signed)
  Problem: Clinical Measurements: Goal: Ability to maintain clinical measurements within normal limits will improve Outcome: Progressing Goal: Will remain free from infection Outcome: Progressing Goal: Diagnostic test results will improve Outcome: Progressing Goal: Respiratory complications will improve Outcome: Progressing Goal: Cardiovascular complication will be avoided Outcome: Progressing   Problem: Activity: Goal: Risk for activity intolerance will decrease Outcome: Progressing   Problem: Education: Goal: Knowledge of General Education information will improve Description: Including pain rating scale, medication(s)/side effects and non-pharmacologic comfort measures Outcome: Not Progressing   Problem: Health Behavior/Discharge Planning: Goal: Ability to manage health-related needs will improve Outcome: Not Progressing

## 2023-06-16 NOTE — Progress Notes (Signed)
PROGRESS NOTE    [name]  JOI:[id number] DOB: 1945/12/30 DOA: 06/04/2023 PCP: Garlan Fillers, MD   Brief Narrative:  77 year old with history of recurrent pneumonia, vitamin B12 deficiency, CVA, seizure, HTN, GERD, diabetes, DVT admitted for shortness of breath.  Initially in the ER severely hypoxic requiring BiPAP but eventually had to be intubated on 8/21.  Initially started on vancomycin, steroids IV Rocephin.  Eventually completed 7 days of Rocephin, 5 days of steroids and she was extubated on 8/28.  Postextubation developed encephalopathy which improved over the course of 3-4 days.   Assessment & Plan:  Principal Problem:   Acute respiratory failure with hypoxia (HCC)   Acute hypoxic respiratory failure Sepsis secondary to community-acquired pneumonia History of OSA on CPAP -Initially intubated on admission 8/21, extubated 8/28.  Completed antibiotic and steroid course.  Now extubated.  I-S/flutter valve.  As needed bronchodilators, I-S/flutter valve  Acute metabolic encephalopathy, improved - There was concerns of ICU delirium.  BUN was also elevated.  EEG, global cerebral dysfunction but no seizures.  Will check UA  Dysphagia - Had aspiration on recent esophagram with multiple pneumonias over the past year.  Seen by speech and swallow, moderate aspiration risk.  Currently NPO.  Ongoing tube feeds.  Planned MBS today  Hypernatremia - Increase frequency of free water flushes  Acute kidney injury on CKD stage IV -Baseline creatinine 1.5.  Admission creatinine 2.17.  Improved with IV fluids  History of seizures -Keppra  Acute on chronic anemia thought to be delusional -Baseline hemoglobin around 8.5.  Had drifted down to 6.9 requiring 1 unit PRBC transfusion on 9/1.  Now stable  CAD status post PCI 02/2023 Essential hypertension -On aspirin, statin, Plavix, Lopressor.  Echocardiogram shows preserved EF with grade 1 DD  GERD -PPI  Depression -Currently p.o.  medicines on hold until mentation remains consistently stable  Will need PT/OT  DVT prophylaxis: SCDs Start: 06/04/23 0753 Code Status: Full code Family Communication:   Status is: Inpatient Remains inpatient appropriate because: Continue hospital stay for evaluation of dysphagia    Subjective: Seen at bedside.  No other complaints.  Mentation improving.  Continue to ask for p.o.  I explained to her that she needs to have speech evaluation   Examination:  General exam: Appears calm and comfortable, NG tube in place Respiratory system: Some bilateral rhonchi Cardiovascular system: S1 & S2 heard, RRR. No JVD, murmurs, rubs, gallops or clicks. No pedal edema. Gastrointestinal system: Abdomen is nondistended, soft and nontender. No organomegaly or masses felt. Normal bowel sounds heard. Central nervous system: Alert and oriented. No focal neurological deficits. Extremities: Symmetric 5 x 5 power. Skin: No rashes, lesions or ulcers Psychiatry: Judgement and insight appear normal. Mood & affect appropriate.      Diet Orders (From admission, onward)     Start     Ordered   06/04/23 0754  Diet NPO time specified  Diet effective now        06/04/23 0755            Objective: Vitals:   06/16/23 0400 06/16/23 0500 06/16/23 0600 06/16/23 0728  BP: (!) 120/55 (!) 148/137 123/82   Pulse: 64 69 77   Resp: 18 17 16    Temp:    (!) 97.2 F (36.2 C)  TempSrc:    Axillary  SpO2: 100% 100% 96%   Weight:  79.3 kg    Height:        Intake/Output Summary (Last 24 hours) at 06/16/2023  0732 Last data filed at 06/16/2023 0400 Gross per 24 hour  Intake 1553.25 ml  Output 1670 ml  Net -116.75 ml   Filed Weights   06/13/23 2100 06/14/23 0158 06/16/23 0500  Weight: 78 kg 78 kg 79.3 kg    Scheduled Meds:  amLODipine  10 mg Per Tube Daily   aspirin  81 mg Per Tube Daily   atorvastatin  40 mg Per Tube Daily   Chlorhexidine Gluconate Cloth  6 each Topical Daily   clopidogrel  75 mg  Per Tube Daily   feeding supplement (PROSource TF20)  60 mL Per Tube Daily   fiber supplement (BANATROL TF)  60 mL Per Tube BID   free water  200 mL Per Tube Q4H   Gerhardt's butt cream   Topical QID   hydrALAZINE  50 mg Per Tube Q8H   insulin aspart  0-20 Units Subcutaneous Q4H   insulin aspart  6 Units Subcutaneous Q4H   levETIRAcetam  500 mg Per Tube BID   melatonin  3 mg Per Tube QHS   metoprolol tartrate  100 mg Per Tube BID   mouth rinse  15 mL Mouth Rinse 4 times per day   pantoprazole (PROTONIX) IV  40 mg Intravenous Q12H   Continuous Infusions:  sodium chloride Stopped (06/13/23 0636)   Vivonex RTF 1,000 mL (06/16/23 0431)    Nutritional status Signs/Symptoms: NPO status Interventions: Tube feeding, Refer to RD note for recommendations, Prostat Body mass index is 30.97 kg/m.  Data Reviewed:   CBC: Recent Labs  Lab 06/09/23 1347 06/10/23 0341 06/10/23 0422 06/13/23 0216 06/14/23 0220 06/14/23 1159 06/14/23 1501 06/15/23 1726 06/16/23 0212  WBC 22.0* 27.5*   < > 20.4* 23.5*  --  28.2* 27.3* 19.7*  NEUTROABS 16.7* 25.3*  --   --   --   --  24.5*  --   --   HGB 8.2* 9.2*   < > 7.9* 7.2* 7.5* 7.3* 6.9* 7.9*  HCT 26.7* 29.9*   < > 25.4* 23.2* 22.0* 23.6* 22.7* 25.6*  MCV 94.0 92.6   < > 91.4 93.9  --  98.3 97.4 95.9  PLT 187 234   < > 256 241  --  245 288 257   < > = values in this interval not displayed.   Basic Metabolic Panel: Recent Labs  Lab 06/11/23 0520 06/12/23 0316 06/13/23 0216 06/14/23 0220 06/14/23 1159 06/15/23 0932 06/16/23 0212  NA 137 138 144 149* 151* 146* 147*  K 4.0 4.2 4.1 3.6 4.2 3.6 3.5  CL 101 99 104 110  --  109 110  CO2 24 26 27 29   --  25 24  GLUCOSE 158* 201* 153* 111*  --  220* 118*  BUN 89* 83* 98* 92*  --  83* 83*  CREATININE 1.70* 1.32* 1.52* 1.72*  --  1.36* 1.35*  CALCIUM 8.8* 9.3 9.3 9.5  --  8.9 9.4  MG 1.8 2.2 2.2 2.3  --   --   --   PHOS 4.5 3.6 3.9 3.8  --   --   --    GFR: Estimated Creatinine Clearance:  35.4 mL/min (A) (by C-G formula based on SCr of 1.35 mg/dL (H)). Liver Function Tests: Recent Labs  Lab 06/12/23 0316 06/13/23 0216 06/14/23 0220  ALBUMIN 2.2* 2.3* 2.1*   No results for input(s): "LIPASE", "AMYLASE" in the last 168 hours. Recent Labs  Lab 06/14/23 1234  AMMONIA 24   Coagulation Profile: No results for input(s): "  INR", "PROTIME" in the last 168 hours. Cardiac Enzymes: No results for input(s): "CKTOTAL", "CKMB", "CKMBINDEX", "TROPONINI" in the last 168 hours. BNP (last 3 results) No results for input(s): "PROBNP" in the last 8760 hours. HbA1C: No results for input(s): "HGBA1C" in the last 72 hours. CBG: Recent Labs  Lab 07-10-2023 1502 07-10-2023 1923 07/10/2023 2343 06/16/23 0323 06/16/23 0726  GLUCAP 94 210* 149* 70 177*   Lipid Profile: No results for input(s): "CHOL", "HDL", "LDLCALC", "TRIG", "CHOLHDL", "LDLDIRECT" in the last 72 hours. Thyroid Function Tests: No results for input(s): "TSH", "T4TOTAL", "FREET4", "T3FREE", "THYROIDAB" in the last 72 hours. Anemia Panel: No results for input(s): "VITAMINB12", "FOLATE", "FERRITIN", "TIBC", "IRON", "RETICCTPCT" in the last 72 hours. Sepsis Labs: Recent Labs  Lab 06/14/23 1130 07/10/23 0250  PROCALCITON 2.39 1.61    No results found for this or any previous visit (from the past 240 hour(s)).       Radiology Studies: EEG adult  Result Date: 07-10-23 Jefferson Fuel, MD     10-Jul-2023  9:25 AM Routine EEG Report JEWELIANA BRUNGARD is a 77 y.o. female with a history of altered mental status who is undergoing an EEG to evaluate for seizures. Report: This EEG was acquired with electrodes placed according to the International 10-20 electrode system (including Fp1, Fp2, F3, F4, C3, C4, P3, P4, O1, O2, T3, T4, T5, T6, A1, A2, Fz, Cz, Pz). The following electrodes were missing or displaced: none. The occipital dominant rhythm was 7 Hz. This activity is reactive to stimulation. Drowsiness was manifested by  background fragmentation; deeper stages of sleep were identified by K complexes and sleep spindles. There was no focal slowing. There were no interictal epileptiform discharges. There were no electrographic seizures identified. Photic stimulation and hyperventilation were not performed. Impression and clinical correlation: This EEG was obtained while awake and asleep and is abnormal due to mild diffuse slowing indicative of global cerebral dysfunction. Epileptiform abnormalities were not seen during this recording. Bing Neighbors, MD [facility] [phone] If 7pm- 7am, please page neurology on call as listed in AMION.           LOS: 12 days   Time spent= 35 mins    Miguel Rota, MD [facility]  If 7PM-7AM, please contact night-coverage  06/16/2023, 7:32 AM

## 2023-06-16 NOTE — Progress Notes (Signed)
[facility] ADULT ICU REPLACEMENT PROTOCOL   The patient does apply for the [facility] Adult ICU Electrolyte Replacment Protocol based on the criteria listed below:   1.Exclusion criteria: TCTS, ECMO, Dialysis, and Myasthenia Gravis patients 2. Is GFR >/= 30 ml/min? Yes.    Patient's GFR today is 41 3. Is SCr </= 2? Yes.   Patient's SCr is 1.35 mg/dL 4. Did SCr increase >/= 0.5 in 24 hours? No. 5.Pt's weight >40kg  Yes.   6. Abnormal electrolyte(s): K  7. Electrolytes replaced per protocol 8.  Call MD STAT for K+ </= 2.5, Phos </= 1, or Mag </= 1 Physician:  Arnette Schaumann [facility] 06/16/2023 3:47 AM

## 2023-06-16 NOTE — Evaluation (Signed)
Modified Barium Swallow Study  Patient Details  Name: [name] MRN: [id number] Date of Birth: Oct 27, 1945  Today's Date: 06/16/2023  Modified Barium Swallow completed.  Full report located under Chart Review in the Imaging Section.  History of Present Illness Pt is a 77 yo female presenting 8/21 with shortness of breath, cough, hypoxia. ETT 8/21-8/28. Pt was seen by SLP most recently in July 2023, recommending regular solids and thin liquids but noting lingual abnormalities and reduced ROM secondary to lingual trauma). Esophagram (November 2021) showed penetration of thin liquids, sensed aspiration with thicker barium, mild esophageal dysmotility, and HH with mild narrowing of the GE junction.   FEES at outside hospital in 2019 [facility]. Imaging from [facility] in 2019 reviewed (report not available) with transient penetration and vallecular residue noted. PMH includes: GERD, stroke, recurrent PNA, seizures, sleep apnea, DM, HTN, DVT, B12 deficiency, CAD, colostomy, chronic pancreatitis   Clinical Impression Pt presents with what is suspected to be an acute on chronic oropharyngeal dysphagia. Oral deficits could potentially be more chronic in nature given baseline lingual abnormalities. She has reduced oral hold, repetitive lingual transport, and a small collection of lingual residue that she does clear spontaneously for the most part. Pharyngeally she ahs partial hyolaryngeal movement, epiglottic inversion, and laryngeal vestibule closure. Thin liquids are penetrated and silently aspirated as a result. Pt was observed to cough at other times throughout testing but it was not in the setting of aspiration. With cued coughs from SLP, she was ultimately able to clear penetrates that had reached her vocal folds but it took multiple attempts. Pharyngeal residue was smaller in amounts compared to oral residue, located primarily at the valleculae but also above the UES with solids. It is possible that some of her  pharyngeal deficits are acute and secondary to prolonged intubation and deconditioning; however, given h/o dysphagia and recurrent PNA, will likely benefit from reassessment after attempts at reconditioning to see if aspiration is still occurring. Recommend starting with Dys 3 diet and nectar thick liquids with SLP f/u for trial of exercises.  Factors that may increase risk of adverse event in presence of aspiration Rubye Oaks [employer] Clearance Coots 2021): Respiratory or GI disease;Reduced cognitive function;Limited mobility;Frail or deconditioned;Dependence for feeding and/or oral hygiene;Presence of tubes (ETT, trach, NG, etc.)  Swallow Evaluation Recommendations Recommendations: PO diet PO Diet Recommendation: Dysphagia 3 (Mechanical soft);Mildly thick liquids (Level 2, nectar thick) Liquid Administration via: Cup;Straw Medication Administration: Whole meds with liquid Supervision: Staff to assist with self-feeding Swallowing strategies  : Slow rate;Small bites/sips Postural changes: Position pt fully upright for meals;Stay upright 30-60 min after meals Oral care recommendations: Oral care BID (2x/day)      Mahala Menghini., M.A. CCC-SLP Acute Rehabilitation Services Office [phone]  Secure chat preferred  06/16/2023,11:26 AM

## 2023-06-16 NOTE — TOC Progression Note (Signed)
Transition of Care [facility]) - Progression Note    Patient Details  Name: [name] MRN: [id number] Date of Birth: 14-Jul-1946  Transition of [facility]) CM/SW Contact  Inis Sizer, LCSW Phone Number: 06/16/2023, 8:35 AM  Clinical Narrative:    CSW attempted to reach admissions at [facility] without success - a voicemail was left requesting a return call.        Expected Discharge Plan and Services                                               Social Determinants of Health (SDOH) Interventions SDOH Screenings   Food Insecurity: No Food Insecurity (02/12/2023)  Housing: Patient Unable To Answer (02/12/2023)  Transportation Needs: No Transportation Needs (02/12/2023)  Utilities: Not At Risk (02/12/2023)  Tobacco Use: Medium Risk (06/04/2023)    Readmission Risk Interventions    06/04/2023    3:49 PM  Readmission Risk Prevention Plan  Transportation Screening Complete  Medication Review (RN Care Manager) Referral to Pharmacy  PCP or Specialist appointment within 3-5 days of discharge Complete  HRI or Home Care Consult Complete  SW Recovery Care/Counseling Consult Complete  Palliative Care Screening Not Applicable  Skilled Nursing Facility Not Applicable

## 2023-06-17 DIAGNOSIS — J9601 Acute respiratory failure with hypoxia: Secondary | ICD-10-CM | POA: Diagnosis not present

## 2023-06-17 LAB — CBC
HCT: 24 % — ABNORMAL LOW (ref 36.0–46.0)
Hemoglobin: 7.5 g/dL — ABNORMAL LOW (ref 12.0–15.0)
MCH: 30.4 pg (ref 26.0–34.0)
MCHC: 31.3 g/dL (ref 30.0–36.0)
MCV: 97.2 fL (ref 80.0–100.0)
Platelets: 242 10*3/uL (ref 150–400)
RBC: 2.47 MIL/uL — ABNORMAL LOW (ref 3.87–5.11)
RDW: 16.5 % — ABNORMAL HIGH (ref 11.5–15.5)
WBC: 14.5 10*3/uL — ABNORMAL HIGH (ref 4.0–10.5)
nRBC: 0.1 % (ref 0.0–0.2)

## 2023-06-17 LAB — PHOSPHORUS: Phosphorus: 3.8 mg/dL (ref 2.5–4.6)

## 2023-06-17 LAB — BASIC METABOLIC PANEL
Anion gap: 9 (ref 5–15)
BUN: 73 mg/dL — ABNORMAL HIGH (ref 8–23)
CO2: 20 mmol/L — ABNORMAL LOW (ref 22–32)
Calcium: 8.6 mg/dL — ABNORMAL LOW (ref 8.9–10.3)
Chloride: 112 mmol/L — ABNORMAL HIGH (ref 98–111)
Creatinine, Ser: 1.31 mg/dL — ABNORMAL HIGH (ref 0.44–1.00)
GFR, Estimated: 42 mL/min — ABNORMAL LOW (ref >60)
Glucose, Bld: 188 mg/dL — ABNORMAL HIGH (ref 70–99)
Potassium: 4.1 mmol/L (ref 3.5–5.1)
Sodium: 141 mmol/L (ref 135–145)

## 2023-06-17 LAB — C DIFFICILE (CDIFF) QUICK SCRN (NO PCR REFLEX)
C Diff antigen: NEGATIVE
C Diff interpretation: NOT DETECTED
C Diff toxin: NEGATIVE

## 2023-06-17 LAB — PREPARE RBC (CROSSMATCH)

## 2023-06-17 LAB — GLUCOSE, CAPILLARY
Glucose-Capillary: 167 mg/dL — ABNORMAL HIGH (ref 70–99)
Glucose-Capillary: 86 mg/dL (ref 70–99)
Glucose-Capillary: 151 mg/dL — ABNORMAL HIGH (ref 70–99)
Glucose-Capillary: 82 mg/dL (ref 70–99)
Glucose-Capillary: 143 mg/dL — ABNORMAL HIGH (ref 70–99)
Glucose-Capillary: 126 mg/dL — ABNORMAL HIGH (ref 70–99)

## 2023-06-17 LAB — MAGNESIUM: Magnesium: 1.9 mg/dL (ref 1.7–2.4)

## 2023-06-17 MED ORDER — SODIUM CHLORIDE 0.9% IV SOLUTION: Freq: Once | INTRAVENOUS | Status: AC

## 2023-06-17 MED ORDER — BANATROL TF EN LIQD
60.0000 mL | Freq: Three times a day (TID) | ENTERAL | Status: DC
  Administered 2023-06-17 – 2023-07-02 (×38): 60 mL via ORAL
  Filled 2023-06-17 (×40): qty 60

## 2023-06-17 MED ORDER — FUROSEMIDE 10 MG/ML IJ SOLN
20.0000 mg | Freq: Once | INTRAMUSCULAR | Status: AC
  Administered 2023-06-17: 20 mg via INTRAVENOUS
  Filled 2023-06-17: qty 2

## 2023-06-17 MED ORDER — VIVONEX RTF PO LIQD
1000.0000 mL | ORAL | Status: DC
  Administered 2023-06-17 – 2023-06-23 (×7): 1000 mL
  Filled 2023-06-17 (×8): qty 1000

## 2023-06-17 MED ORDER — PANCRELIPASE (LIP-PROT-AMYL) 12000-38000 UNITS PO CPEP
24000.0000 [IU] | ORAL_CAPSULE | Freq: Three times a day (TID) | ORAL | Status: DC
  Administered 2023-06-18 – 2023-07-03 (×33): 24000 [IU] via ORAL
  Filled 2023-06-17 (×37): qty 2

## 2023-06-17 MED ORDER — TRAZODONE HCL 50 MG PO TABS
150.0000 mg | ORAL_TABLET | Freq: Every evening | ORAL | Status: DC | PRN
  Administered 2023-06-17 – 2023-07-02 (×14): 150 mg via ORAL
  Filled 2023-06-17 (×15): qty 3

## 2023-06-17 MED ORDER — LOPERAMIDE HCL 2 MG PO CAPS
4.0000 mg | ORAL_CAPSULE | ORAL | Status: DC | PRN
  Administered 2023-06-17 – 2023-07-01 (×20): 4 mg via ORAL
  Filled 2023-06-17 (×20): qty 2

## 2023-06-17 MED ORDER — PANTOPRAZOLE SODIUM 40 MG IV SOLR
40.0000 mg | Freq: Every day | INTRAVENOUS | Status: DC
  Administered 2023-06-18: 40 mg via INTRAVENOUS
  Filled 2023-06-17: qty 10

## 2023-06-17 NOTE — Progress Notes (Deleted)
06/17/2023 KAMARIA ZAPPA [id number] Nov 14, 1945  Referring provider: Garlan Fillers, MD Primary GI doctor: Dr. Russella Dar  ASSESSMENT AND PLAN:   Diarrhea with fecal incontinence, with extensive workup in the past. Previous right hemicolectomy for ischemic colitis CT abdomen pelvis without contrast 12/2022 unremarkable Benign abdomen on exam today Patient's been off  Questran, does not like powder  - stool samples to rule out infection with ABX use for urinary tract infections-  -Pancreatic elastase repeat to evaluate, only taking Zenpep once daily Can do trial of IBGARD daily, add on fiber FODMAP, trial off lactulose and lifestyle changes discussed Will do trial of colestipol 2 g up to twice daily since patient unable to tolerate powder Given information about Imodium can increase this, no significant abdominal pain no need for Lomotil at this time. If labs are negative for infection can consider repeat metronidazole prescription as patient has some well with this in the past Patient with recent CAD status post DES 0 5/1 cannot get off Plavix, if patient continues to have significant issues can consider repeat fleck sigmoidoscopy to evaluate for microscopic colitis but most likely this is still related to patient's previous hemicolectomy Patient high risk for endoscopic procedures  CAD, multiple vessel with on the table angina with CHF 02/12/2023 patient had unstable angina, status post DES to ostial RCA. Will need to be on Plavix and aspirin for 1 year Follows with Dr. [place]  CKD (chronic kidney disease) stage 4, GFR 15-29 ml/min (HCC) Following with nephrology  Chronic GERD Continue pantoprazole every day  Hx of adenomatous colonic polyps Flex sig 12/2018 - Diffuse mild mucosal changes were found in the rectum and in the sigmoid colon secondary to diversion colitis. Biopsied. - Unable to advance beyond 25 cm due to restricted mobility of the colon. - Internal hemorrhoids.  Path benign Recall 3 years Unable to do at this time with recent DES 02/12/23 Close follow up     Patient Care Team: Garlan Fillers, MD as PCP - General (Internal Medicine) [place], Peter M, MD as PCP - Cardiology (Cardiology) Griselda Miner, MD as Consulting Physician (General Surgery) Marvel Plan, MD as Consulting Physician (Neurology)  HISTORY OF PRESENT ILLNESS: 77 y.o. female with a past medical history of CVA 2023 on Plavix status post carotid stenting, peripheral chill disease, congestive heart failure, type 2 diabetes, CAD multivessel recent cath 02/12/2023 status post DES to ostial RCA, COPD, CKD stage IV, 2018 severe ischemic colitis status post right hemicolectomy with ileostomy and then reversal.  And others listed below presents for evaluation of diarrhea.   EGD 11/01/2021 that showed diffuse mild gastritis with erythema friability in the gastric fundus and body and a few scattered erosions in the cardia consistent with Sheria Lang erosions as well as a medium sized hiatal hernia. Biopsies showed mild chronic gastritis no H. pylori and parietal cell hyperplasia  01/02/2023 CT AB and pelvis without contrast unremarkable Patient follows with Dr. Peter [place] cardiology. 02/12/2023 patient had unstable angina, status post DES to ostial RCA. Will need to be on Plavix and aspirin for close year.  Patient has history of chronic diarrhea post right hemicolectomy, managed previously with Lomotil and Questran. 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 Difficile Xifaxan too expensive, given course of Flagyl for 10 days 12/04/2022 and this has been helping.  04/10/2023 OV with me for change in stools/diarrhea.   09/03 Cdiff negative  Presents today and states that she  continues to have diarrhea, not worse than it has been.  States it affects her life, she can not go anywhere.  She has fecal incontinence and has to wear pads.   She has soft stools to liquids leak out.  Some AB pain but not significant.  She has had some rectal bleeding, small to moderate amount, thinks she has some rectal irritation and itching.  She has had some issues with feeling she will pass out. No chest pain, no SOB. She states she has been more forgetful.  She had weight loss when she was in the hospital in 04/05 but this has increased and improved.  She has had several ABX for UTI's in the last 6 months   She is not on questran, states she does not like the powder. She is on magnesium.   She  reports that she quit smoking about 12 years ago. Her smoking use included cigarettes. She started smoking about 62 years ago. She has a 50 pack-year smoking history. She has never used smokeless tobacco. She reports that she does not drink alcohol and does not use drugs.  RELEVANT LABS AND IMAGING: CBC    Component Value Date/Time   WBC 14.5 (H) 06/17/2023 0230   RBC 2.47 (L) 06/17/2023 0230   HGB 7.5 (L) 06/17/2023 0230   HGB 12.1 02/06/2023 1345   HCT 24.0 (L) 06/17/2023 0230   HCT 37.9 02/06/2023 1345   PLT 242 06/17/2023 0230   PLT 251 02/06/2023 1345   MCV 97.2 06/17/2023 0230   MCV 94 02/06/2023 1345   MCH 30.4 06/17/2023 0230   MCHC 31.3 06/17/2023 0230   RDW 16.5 (H) 06/17/2023 0230   RDW 12.8 02/06/2023 1345   LYMPHSABS 2.3 06/14/2023 1501   MONOABS 1.1 (H) 06/14/2023 1501   EOSABS 0.3 06/14/2023 1501   BASOSABS 0.0 06/14/2023 1501   Recent Labs    06/11/23 0520 06/11/23 1903 06/12/23 0540 06/13/23 0216 06/14/23 0220 06/14/23 1159 06/14/23 1501 06/15/23 1726 06/16/23 0212 06/17/23 0230  HGB 8.0* 9.6* 8.7* 7.9* 7.2* 7.5* 7.3* 6.9* 7.9* 7.5*    CMP     Component Value Date/Time   NA 141 06/17/2023 0230   NA 141 02/18/2023 1449   K 4.1 06/17/2023 0230   CL 112 (H) 06/17/2023 0230   CO2 20 (L) 06/17/2023 0230   GLUCOSE 188 (H) 06/17/2023 0230   BUN 73 (H) 06/17/2023 0230   BUN 16 02/18/2023 1449   CREATININE  1.31 (H) 06/17/2023 0230   CALCIUM 8.6 (L) 06/17/2023 0230   PROT 5.4 (L) 06/06/2023 0311   ALBUMIN 2.1 (L) 06/14/2023 0220   AST 25 06/06/2023 0311   ALT 17 06/06/2023 0311   ALKPHOS 75 06/06/2023 0311   BILITOT 0.7 06/06/2023 0311   GFRNONAA 42 (L) 06/17/2023 0230   GFRAA 30 (L) 10/21/2019 1904      Latest Ref Rng & Units 06/14/2023    2:20 AM 06/13/2023    2:16 AM 06/12/2023    3:16 AM  Hepatic Function  Albumin 3.5 - 5.0 g/dL 2.1  2.3  2.2       Current Medications:     Facility-Administered Medications Ordered in Other Visits (Endocrine & Metabolic):    insulin aspart (novoLOG) injection 0-20 Units    Facility-Administered Medications Ordered in Other Visits (Cardiovascular):    amLODipine (NORVASC) tablet 10 mg   atorvastatin (LIPITOR) tablet 40 mg   hydrALAZINE (APRESOLINE) injection 10 mg   hydrALAZINE (APRESOLINE) tablet 50 mg  metoprolol tartrate (LOPRESSOR) injection 5 mg   metoprolol tartrate (LOPRESSOR) tablet 100 mg    Facility-Administered Medications Ordered in Other Visits (Respiratory):    ipratropium-albuterol (DUONEB) 0.5-2.5 (3) MG/3ML nebulizer solution 3 mL    Facility-Administered Medications Ordered in Other Visits (Analgesics):    acetaminophen (TYLENOL) tablet 650 mg   aspirin chewable tablet 81 mg    Facility-Administered Medications Ordered in Other Visits (Hematological):    clopidogrel (PLAVIX) tablet 75 mg    Facility-Administered Medications Ordered in Other Visits (Other):    0.9 %  sodium chloride infusion   Chlorhexidine Gluconate Cloth 2 % PADS 6 each   docusate (COLACE) 50 MG/5ML liquid 100 mg   fiber supplement (BANATROL TF) liquid 60 mL   Gerhardt's butt cream   levETIRAcetam (KEPPRA) tablet 500 mg   lipase/protease/amylase (CREON) capsule 24,000 Units   loperamide (IMODIUM) capsule 4 mg   melatonin tablet 3 mg   ondansetron (ZOFRAN) injection 4 mg   Oral care mouth rinse   Oral care mouth rinse   [START ON  06/18/2023] pantoprazole (PROTONIX) injection 40 mg   polyethylene glycol (MIRALAX / GLYCOLAX) packet 17 g   senna-docusate (Senokot-S) tablet 1 tablet   traZODone (DESYREL) tablet 150 mg   Vivonex RTF LIQD 1,000 mL No current facility-administered medications for this visit. No current outpatient medications on file.  Medical History:  Past Medical History:  Diagnosis Date   Anxiety    Arthritis    back    Blind loop syndrome    Clotting disorder (HCC)    Hx Clot in leg per pt    Colostomy present [facility])    Coronary artery disease    a. known 60-70% mid-LAD stenosis with caths in 1999, 2002, 2006, and 2012 showing stable anatomy b. 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HCC)    Diabetes mellitus    Diverticulosis    Dizziness    chronic   Dyslipidemia    Fatty liver    GERD (gastroesophageal reflux disease)    hiatial hernia   Heart murmur    Hiatal hernia    Hypertension    Irritable bowel syndrome    Pancreatitis, chronic (HCC)    Peripheral vascular disease (HCC) 02/20/2010   s/p stent of left SFA; carotid stenosis   Seizure (HCC) 11/04/2018   last 1 yr 2020 per pt unsure month    Sleep apnea    no cpap    Stroke [facility])    Thyroid nodule    Vitamin B12 deficiency    Allergies:  Allergies  Allergen Reactions   Adhesive [Tape] Other (See Comments)    "Took off my skin"   Other Rash    "Took off my skin"   Ace Inhibitors Other (See Comments)    Unknown   Clonidine     Resulted in excessive sleepiness/somnolence   Sulfa Antibiotics Other (See Comments)    Unknown   Topiramate     unknown   Codeine Other (See Comments)    Altered mental state    Iodinated Contrast Media Rash   Latex Rash     Surgical History:  She  has a past surgical history that includes Breast enhancement surgery; Tonsillectomy; Abdominal hysterectomy; Cardiac catheterization (07/07/98;08/31/01;03/04/05); Femoral artery stent; Patella realignment (Left); Cataract  extraction (Bilateral); Endobronchial ultrasound (Bilateral, 02/24/2017); Augmentation mammaplasty; Lung surgery (03/2017); Colon resection (N/A, 10/14/2017); laparotomy (N/A, 10/16/2017); laparotomy (N/A, 10/18/2017); Sigmoidoscopy; Polypectomy; Colonoscopy; Transcarotid artery revascularization (Right, 04/18/2022); LEFT  HEART CATH AND CORONARY ANGIOGRAPHY (N/A, 01/29/2023); CORONARY ATHERECTOMY (N/A, 02/12/2023); CORONARY STENT INTERVENTION (N/A, 02/12/2023); and Coronary Ultrasound/IVUS (N/A, 02/12/2023). Family History:  Her family history includes Colon cancer in her paternal grandfather; Hypertension in her father and mother; Osteoarthritis in her mother; Pulmonary embolism in her mother; Stroke in her father and another family member.  REVIEW OF SYSTEMS  : All other systems reviewed and negative except where noted in the History of Present Illness.  PHYSICAL EXAM: There were no vitals taken for this visit. General Appearance: Obese, in no apparent distress. Head:   Normocephalic and atraumatic. Eyes:  sclerae anicteric,conjunctive pink  Respiratory: Respiratory effort normal, BS equal bilaterally without rales, rhonchi, wheezing. Cardio: RRR with no MRGs. Peripheral pulses intact.  Abdomen: Soft,   obese with upper abdominal midline hernia reducible,active bowel sounds. No tenderness . Without guarding and Without rebound. No masses. Rectal: declines Musculoskeletal: Full ROM, Antalgic gait, able to get on exam table with some assistance. Without edema. Skin:  Dry and intact without significant lesions or rashes Neuro: Alert and  oriented x4;  No focal deficits. Psych:  Cooperative. Normal mood and affect.    Doree Albee, PA-C 8:28 PM

## 2023-06-17 NOTE — Progress Notes (Signed)
Nutrition Follow-up  DOCUMENTATION CODES:   Not applicable  INTERVENTION:   Transition to nocturnal tube feeds via Cortrak: - Vivonex RTF @ 83 ml/hr x 12 hours from 1800 to 0600 (total of 996 ml nightly)  Nocturnal tube feeding regimen provides 996 kcal, 50 grams of protein, and 845 ml of H2O (meets 60% of minimum kcal needs and 59% of minimum protein needs).  - Increase Banatrol TF to TID  - Mighty Shake TID with meals, each supplement provides 330 kcals and 9 grams of protein  - Encourage PO intake  NUTRITION DIAGNOSIS:   Inadequate oral intake related to inability to eat as evidenced by NPO status.  Progressing, pt now on PO diet  GOAL:   Patient will meet greater than or equal to 90% of their needs  Progressing, being addressed via nocturnal tube feeds, PO diet, and oral nutrition supplements  MONITOR:   Vent status, Labs, Weight trends, TF tolerance, I & O's  REASON FOR ASSESSMENT:   Ventilator, Consult Enteral/tube feeding initiation and management  ASSESSMENT:   77 year old female who presented to the ED on 8/21 with respiratory distress. PMH of recurrent pneumonias, vitamin B12 deficiency, stroke, seizures, HTN, GERD, DM, DVT, chronic pancreatitis, CAD, colostomy. Pt admitted with pneumonia, sepsis.  8/21 - intubated, intermittent TF via pump started 8/22 - pt with small volume emesis, changed to continuous TF with Vivonex 8/23 - NG tube exchanged for Cortrak (tip gastric) 8/28 - extubated 9/02 - MBS, diet advanced to dysphagia 3 with nectar-thick liquids 9/03 - TF changed to nocturnal  Discussed pt with RN and during ICU rounds. Per RN, pt eating a few bites at a time and has not been very hungry. RD to add thickened oral nutrition supplements to meal trays. Transitioning pt to nocturnal tube feeds per discussion with MD.  Sherron Monday with pt at bedside. Pt perseverating on trying to go home to get something. Pt unable to provide any details regarding appetite  or PO intake.  Discussed adding Creon with meals; MD has ordered.  Admit weight: 68 kg (likely estimated) Current weight: 79.3 kg  Current TF: Vivonex @ 70 ml/hr, PROSource TF20 60 ml daily  Medications reviewed and include: banatrol TF BID, IV lasix 20 mg x 1, SSI every 4 hours, Creon 24,000 units TID before meals, IV protonix  Labs reviewed: BUN 73, creatinine 1.31, WBC 14.5, hemoglobin 7.5 CBG's: 86-167 x 24 hours  I/O's: +5.0 L since admit  Diet Order:   Diet Order             DIET DYS 3 Room service appropriate? Yes with Assist; Fluid consistency: Nectar Thick  Diet effective now                   EDUCATION NEEDS:   Not appropriate for education at this time  Skin:  Skin Assessment: Reviewed RN Assessment (skin tear buttocks)  Last BM:  06/17/23 multiple type 7  Height:   Ht Readings from Last 1 Encounters:  06/04/23 5\' 3"  (1.6 m)    Weight:   Wt Readings from Last 1 Encounters:  06/16/23 79.3 kg    Ideal Body Weight:  52.3 kg  BMI:  Body mass index is 30.97 kg/m.  Estimated Nutritional Needs:   Kcal:  1650-1850  Protein:  85-100 grams  Fluid:  1.6-1.8 L    Mertie Clause, MS, RD, LDN Registered Dietitian II Please see AMiON for contact information.

## 2023-06-17 NOTE — Progress Notes (Signed)
Speech Language Pathology Treatment: Dysphagia  Patient Details Name: [name] MRN: [id number] DOB: [birth year] Today's Date: 06/17/2023 Time: 0272-5366 SLP Time Calculation (min) (ACUTE ONLY): 20 min  Assessment / Plan / Recommendation Clinical Impression  Pt needs assistance with repositioning before and even during POs. She has prolonged mastication but she clears her mouth adequately and subjectively says that she likes the textures. Baseline coughing is noted before and during POs, but note that on previous date, aspiration was silent and coughing observed during MBS was unrelated. She was given cues to take rest breaks for respiratory status and slow her pacing. Education was reinforced about MBS results as pt did not recall education from previous date. Recommend continuing with Dys 3 diet and nectar thick liquids for now and will consider trial of swallowing exercises given possible acute deconditioning.    HPI HPI: Pt is a 77 yo female presenting 8/21 with shortness of breath, cough, hypoxia. ETT 8/21-8/28. Pt was seen by SLP most recently in July 2023, recommending regular solids and thin liquids but noting lingual abnormalities and reduced ROM secondary to lingual trauma). Esophagram (November 2021) showed penetration of thin liquids, sensed aspiration with thicker barium, mild esophageal dysmotility, and HH with mild narrowing of the GE junction.   FEES at outside hospital in 2019 [facility]. Imaging from [facility] in 2019 reviewed (report not available) with transient penetration and vallecular residue noted. PMH includes: GERD, stroke, recurrent PNA, seizures, sleep apnea, DM, HTN, DVT, B12 deficiency, CAD, colostomy, chronic pancreatitis      SLP Plan  Continue with current plan of care      Recommendations for follow up therapy are one component of a multi-disciplinary discharge planning process, led by the attending physician.  Recommendations may be updated based on patient status,  additional functional criteria and insurance authorization.    Recommendations  Diet recommendations: Dysphagia 3 (mechanical soft);Nectar-thick liquid Liquids provided via: Cup;Straw Medication Administration: Whole meds with liquid Supervision: Staff to assist with self feeding Compensations: Slow rate;Small sips/bites Postural Changes and/or Swallow Maneuvers: Seated upright 90 degrees;Upright 30-60 min after meal                  Oral care BID   Frequent or constant Supervision/Assistance Dysphagia, oropharyngeal phase (R13.12)     Continue with current plan of care     Mahala Menghini., M.A. CCC-SLP Acute Rehabilitation Services Office [phone]  Secure chat preferred   06/17/2023, 9:34 AM

## 2023-06-17 NOTE — Progress Notes (Addendum)
Physical Therapy Treatment Patient Details Name: [name] MRN: [id number] DOB: 06-05-1946 Today's Date: 06/17/2023   History of Present Illness Pt is 77 year old presented [facility]  [facility]23 for acute respiratory failure due to PNA. Intubatd on admission. Extubated 8/28. Pt encephaloathic post extubation. 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    PT Comments  Pt lethargic and easily fatigued.  Attempted to see earlier in day but was transferring rooms.  She tolerated transfer to chair with mod A of 1 but declined further mobility due to fatigue.  Increased time for transfers and for positioning in chair to eat.   Do continue to recommend that Patient will benefit from continued inpatient follow up therapy, <3 hours/day at d/c   If plan is discharge home, recommend the following: A lot of help with walking and/or transfers;A lot of help with bathing/dressing/bathroom;Assist for transportation;Help with stairs or ramp for entrance   Can travel by private vehicle     No  Equipment Recommendations  Other (comment) (defer to next venue)    Recommendations for Other Services       Precautions / Restrictions Precautions Precautions: Fall     Mobility  Bed Mobility Overal bed mobility: Needs Assistance Bed Mobility: Supine to Sit     Supine to sit: Min assist     General bed mobility comments: increased time    Transfers Overall transfer level: Needs assistance Equipment used: 1 person hand held assist Transfers: Sit to/from Stand, Bed to chair/wheelchair/BSC Sit to Stand: Mod assist   Step pivot transfers: Mod assist       General transfer comment: Increased time for transfers - held therapist hand and armrest.  Pt only able to tolerate transfer to chair due to  fatigue/lethargy    Ambulation/Gait                   Stairs             Wheelchair Mobility     Tilt Bed    Modified Rankin (Stroke Patients Only)       Balance Overall balance assessment: Needs assistance Sitting-balance support: No upper extremity supported Sitting balance-Leahy Scale: Fair     Standing balance support: Bilateral upper extremity supported Standing balance-Leahy Scale: Poor  EOB 8 mins for exercises                            Cognition Arousal: Lethargic Behavior During Therapy: WFL for tasks assessed/performed Overall Cognitive Status: Impaired/Different from baseline Area of Impairment: Attention, Memory, Following commands, Safety/judgement, Awareness, Problem solving                 Orientation Level: Disoriented to, Time, Situation Current Attention Level: Sustained Memory: Decreased recall of precautions, Decreased short-term memory Following Commands: Follows one step commands consistently Safety/Judgement: Decreased awareness of safety, Decreased awareness of deficits Awareness: Emergent Problem Solving: Slow processing, Requires verbal cues          Exercises General Exercises - Lower Extremity Ankle Circles/Pumps: AROM, Both, 5 reps, Seated Long Arc Quad: AROM, Both, 5 reps, Seated Hip Flexion/Marching: AROM, Both, 5 reps, Supine Other Exercises Other Exercises: Fatigued easily; cues for full ROM    General Comments General comments (skin integrity, edema, etc.): VSS  Pertinent Vitals/Pain Pain Assessment Pain Assessment: Faces Faces Pain Scale: Hurts little more Pain Location: bottom Pain Descriptors / Indicators: Sore Pain Intervention(s): Limited activity within patient's tolerance, Monitored during session, Repositioned    Home Living                          Prior Function            PT Goals (current goals can now be found in the care plan section) Progress  towards PT goals: Progressing toward goals    Frequency    Min 1X/week      PT Plan      Co-evaluation              AM-PAC PT "6 Clicks" Mobility   Outcome Measure  Help needed turning from your back to your side while in a flat bed without using bedrails?: A Lot Help needed moving from lying on your back to sitting on the side of a flat bed without using bedrails?: A Lot Help needed moving to and from a bed to a chair (including a wheelchair)?: A Lot Help needed standing up from a chair using your arms (e.g., wheelchair or bedside chair)?: A Lot Help needed to walk in hospital room?: Total Help needed climbing 3-5 steps with a railing? : Total 6 Click Score: 10    End of Session Equipment Utilized During Treatment: Gait belt;Oxygen Activity Tolerance: Patient limited by lethargy;Patient limited by fatigue Patient left: in chair;with call bell/phone within reach;with chair alarm set Nurse Communication: Mobility status PT Visit Diagnosis: Unsteadiness on feet (R26.81);Other abnormalities of gait and mobility (R26.89);Muscle weakness (generalized) (M62.81)     Time: 1633-1700 PT Time Calculation (min) (ACUTE ONLY): 27 min  Charges:    $Therapeutic Activity: 23-37 mins PT General Charges $$ ACUTE PT VISIT: 1 Visit                     Anise Salvo, PT Acute Rehab [facility] [phone]    Rayetta Humphrey 06/17/2023, 5:09 PM

## 2023-06-17 NOTE — Hospital Course (Addendum)
  Brief Narrative:  77 year old with history of recurrent pneumonia, vitamin B12 deficiency, CVA, seizure, HTN, GERD, diabetes, DVT admitted for shortness of breath.  Initially in the ER severely hypoxic requiring BiPAP but eventually had to be intubated on 8/21.  Initially started on vancomycin, steroids IV Rocephin.  Eventually completed 7 days of Rocephin, 5 days of steroids and she was extubated on 8/28.  Postextubation developed encephalopathy which improved over the course of 3-4 days.  Speech and swallow performed MBS, and D3 diet recommended.     Assessment & Plan:  Principal Problem:   Acute respiratory failure with hypoxia (HCC)   Acute hypoxic respiratory failure, improved Sepsis secondary to community-acquired pneumonia History of OSA on CPAP -Initially intubated on admission 8/21, extubated 8/28.  Completed antibiotic and steroid course.  Now extubated.  I-S/flutter valve.  As needed bronchodilators, I-S/flutter valve   Acute metabolic encephalopathy, Resolved.  - There was concerns of ICU delirium.  BUN was also elevated.  EEG, global cerebral dysfunction but no seizures.    Dysphagia - Had aspiration on recent esophagram with multiple pneumonias over the past year.  Seen by speech and swallow, moderate aspiration risk.  Status post MBS.  D3 diet.  P.o. intake not adequate, continue NG tube, transition to nocturnal feeding to allow daytime p.o. intake  Diarrhea -Suspect from tube feeds.  C. difficile negative.   Hypernatremia - Resolved   Acute kidney injury on CKD stage IV -Baseline creatinine 1.5.  Admission creatinine 2.17.  Improved with IV fluids   History of seizures -Keppra   Acute on chronic anemia thought to be delusional -Baseline hemoglobin around 8.5.  Had drifted down to 6.9 requiring 1 unit PRBC transfusion on 9/1.  Hemoglobin 7.5, will give additional unit to keep above 8.0. No signs of any bleeding.    CAD status post PCI 02/2023 Essential  hypertension -On aspirin, statin, Plavix, Lopressor.  Echocardiogram shows preserved EF with grade 1 DD   GERD -PPI   Depression -Currently p.o. medicines on hold until mentation remains consistently stable   PT/OT   DVT prophylaxis: SCDs Start: 06/04/23 0753 Code Status: Full code Family Communication:   Status is: Inpatient Remains inpatient appropriate because: Slow getting stronger, needs to start working with PT.

## 2023-06-17 NOTE — Progress Notes (Signed)
PROGRESS NOTE    [name]  OZH:[id number] DOB: 1945-10-23 DOA: 06/04/2023 PCP: Garlan Fillers, MD     Brief Narrative:  77 year old with history of recurrent pneumonia, vitamin B12 deficiency, CVA, seizure, HTN, GERD, diabetes, DVT admitted for shortness of breath.  Initially in the ER severely hypoxic requiring BiPAP but eventually had to be intubated on 8/21.  Initially started on vancomycin, steroids IV Rocephin.  Eventually completed 7 days of Rocephin, 5 days of steroids and she was extubated on 8/28.  Postextubation developed encephalopathy which improved over the course of 3-4 days.  Speech and swallow performed MBS, and D3 diet recommended.     Assessment & Plan:  Principal Problem:   Acute respiratory failure with hypoxia (HCC)   Acute hypoxic respiratory failure, improved Sepsis secondary to community-acquired pneumonia History of OSA on CPAP -Initially intubated on admission 8/21, extubated 8/28.  Completed antibiotic and steroid course.  Now extubated.  I-S/flutter valve.  As needed bronchodilators, I-S/flutter valve   Acute metabolic encephalopathy, Resolved.  - There was concerns of ICU delirium.  BUN was also elevated.  EEG, global cerebral dysfunction but no seizures.    Dysphagia - Had aspiration on recent esophagram with multiple pneumonias over the past year.  Seen by speech and swallow, moderate aspiration risk.  Status post MBS.  D3 diet.  P.o. intake not adequate, continue NG tube, transition to nocturnal feeding to allow daytime p.o. intake  Diarrhea -Suspect from tube feeds.  C. difficile negative.   Hypernatremia - Resolved   Acute kidney injury on CKD stage IV -Baseline creatinine 1.5.  Admission creatinine 2.17.  Improved with IV fluids   History of seizures -Keppra   Acute on chronic anemia thought to be delusional -Baseline hemoglobin around 8.5.  Had drifted down to 6.9 requiring 1 unit PRBC transfusion on 9/1.  Hemoglobin 7.5, will give  additional unit to keep above 8.0. No signs of any bleeding.    CAD status post PCI 02/2023 Essential hypertension -On aspirin, statin, Plavix, Lopressor.  Echocardiogram shows preserved EF with grade 1 DD   GERD -PPI   Depression -Currently p.o. medicines on hold until mentation remains consistently stable   PT/OT   DVT prophylaxis: SCDs Start: 06/04/23 0753 Code Status: Full code Family Communication:   Status is: Inpatient Remains inpatient appropriate because: Slow getting stronger, needs to start working with PT.        Subjective: P.o. intake remains poor.  Improving overall.   Examination:  General exam: Appears calm and comfortable, NG tube in place.  3 L nasal cannula. Respiratory system: Mild bilateral rhonchi Cardiovascular system: S1 & S2 heard, RRR. No JVD, murmurs, rubs, gallops or clicks. No pedal edema. Gastrointestinal system: Abdomen is nondistended, soft and nontender. No organomegaly or masses felt. Normal bowel sounds heard. Central nervous system: Alert and oriented. No focal neurological deficits. Extremities: Symmetric 4 x 5 power. Skin: No rashes, lesions or ulcers Psychiatry: Judgement and insight appear normal. Mood & affect appropriate.       Diet Orders (From admission, onward)     Start     Ordered   06/16/23 0956  DIET DYS 3 Room service appropriate? Yes with Assist; Fluid consistency: Nectar Thick  Diet effective now       Question Answer Comment  Room service appropriate? Yes with Assist   Fluid consistency: Nectar Thick      06/16/23 0956            Objective: Vitals:  06/17/23 0600 06/17/23 0726 06/17/23 1001 06/17/23 1002  BP: 112/74  (!) 132/55 (!) 132/55  Pulse: 87  79   Resp: 20     Temp:  97.9 F (36.6 C)    TempSrc:  Axillary    SpO2: 99%     Weight:      Height:        Intake/Output Summary (Last 24 hours) at 06/17/2023 1104 Last data filed at 06/16/2023 2000 Gross per 24 hour  Intake 645 ml  Output 140  ml  Net 505 ml   Filed Weights   06/13/23 2100 06/14/23 0158 06/16/23 0500  Weight: 78 kg 78 kg 79.3 kg    Scheduled Meds:  sodium chloride   Intravenous Once   amLODipine  10 mg Oral Daily   aspirin  81 mg Oral Daily   atorvastatin  40 mg Oral Daily   Chlorhexidine Gluconate Cloth  6 each Topical Daily   clopidogrel  75 mg Oral Daily   feeding supplement (PROSource TF20)  60 mL Per Tube Daily   fiber supplement (BANATROL TF)  60 mL Oral BID   Gerhardt's butt cream   Topical QID   hydrALAZINE  50 mg Oral Q8H   insulin aspart  0-20 Units Subcutaneous Q4H   levETIRAcetam  500 mg Oral BID   melatonin  3 mg Oral QHS   metoprolol tartrate  100 mg Oral BID   mouth rinse  15 mL Mouth Rinse 4 times per day   pantoprazole (PROTONIX) IV  40 mg Intravenous Q12H   Vivonex RTF  1,000 mL Per Tube Q24H   Continuous Infusions:  sodium chloride Stopped (06/13/23 0636)    Nutritional status Signs/Symptoms: NPO status Interventions: Tube feeding, Refer to RD note for recommendations, Prostat Body mass index is 30.97 kg/m.  Data Reviewed:   CBC: Recent Labs  Lab 06/14/23 0220 06/14/23 1159 06/14/23 1501 06/15/23 1726 06/16/23 0212 06/17/23 0230  WBC 23.5*  --  28.2* 27.3* 19.7* 14.5*  NEUTROABS  --   --  24.5*  --   --   --   HGB 7.2* 7.5* 7.3* 6.9* 7.9* 7.5*  HCT 23.2* 22.0* 23.6* 22.7* 25.6* 24.0*  MCV 93.9  --  98.3 97.4 95.9 97.2  PLT 241  --  245 288 257 242   Basic Metabolic Panel: Recent Labs  Lab 06/11/23 0520 06/12/23 0316 06/13/23 0216 06/14/23 0220 06/14/23 1159 06/15/23 0932 06/16/23 0212 06/17/23 0230  NA 137 138 144 149* 151* 146* 147* 141  K 4.0 4.2 4.1 3.6 4.2 3.6 3.5 4.1  CL 101 99 104 110  --  109 110 112*  CO2 24 26 27 29   --  25 24 20*  GLUCOSE 158* 201* 153* 111*  --  220* 118* 188*  BUN 89* 83* 98* 92*  --  83* 83* 73*  CREATININE 1.70* 1.32* 1.52* 1.72*  --  1.36* 1.35* 1.31*  CALCIUM 8.8* 9.3 9.3 9.5  --  8.9 9.4 8.6*  MG 1.8 2.2 2.2 2.3   --   --   --  1.9  PHOS 4.5 3.6 3.9 3.8  --   --   --  3.8   GFR: Estimated Creatinine Clearance: 36.5 mL/min (A) (by C-G formula based on SCr of 1.31 mg/dL (H)). Liver Function Tests: Recent Labs  Lab 06/12/23 0316 06/13/23 0216 06/14/23 0220  ALBUMIN 2.2* 2.3* 2.1*   No results for input(s): "LIPASE", "AMYLASE" in the last 168 hours. Recent Labs  Lab  06/14/23 1234  AMMONIA 24   Coagulation Profile: No results for input(s): "INR", "PROTIME" in the last 168 hours. Cardiac Enzymes: No results for input(s): "CKTOTAL", "CKMB", "CKMBINDEX", "TROPONINI" in the last 168 hours. BNP (last 3 results) No results for input(s): "PROBNP" in the last 8760 hours. HbA1C: No results for input(s): "HGBA1C" in the last 72 hours. CBG: Recent Labs  Lab 06/16/23 1546 06/16/23 1940 06/16/23 2346 06/17/23 0346 06/17/23 0724  GLUCAP 122* 141* 115* 167* 86   Lipid Profile: No results for input(s): "CHOL", "HDL", "LDLCALC", "TRIG", "CHOLHDL", "LDLDIRECT" in the last 72 hours. Thyroid Function Tests: Recent Labs    06/16/23 0803  TSH 3.245   Anemia Panel: No results for input(s): "VITAMINB12", "FOLATE", "FERRITIN", "TIBC", "IRON", "RETICCTPCT" in the last 72 hours. Sepsis Labs: Recent Labs  Lab 06/14/23 1130 06/15/23 0250  PROCALCITON 2.39 1.61    Recent Results (from the past 240 hour(s))  C Difficile Quick Screen (NO PCR Reflex)     Status: None   Collection Time: 06/17/23  4:52 AM   Specimen: STOOL  Result Value Ref Range Status   C Diff antigen NEGATIVE NEGATIVE Final   C Diff toxin NEGATIVE NEGATIVE Final   C Diff interpretation No C. difficile detected.  Final    Comment: Performed at [facility], [address], [place], [place] [address]         Radiology Studies: No results found.         LOS: 13 days   Time spent= 35 mins    Miguel Rota, MD [facility]  If 7PM-7AM, please contact night-coverage  06/17/2023, 11:04 AM

## 2023-06-17 NOTE — TOC Progression Note (Signed)
Transition of Care [facility]) - Progression Note    Patient Details  Name: [name] MRN: [id number] Date of Birth: 09-28-1946  Transition of [facility]) CM/SW Contact  Tom-Johnson, Hershal Coria, RN Phone Number: 06/17/2023, 10:23 AM  Clinical Narrative:     Patient's discharge disposition changed from LTAC to SNF, patient is out of requirement for LTAC at this time.  TOC will continue to follow.       Expected Discharge Plan and Services                                               Social Determinants of Health (SDOH) Interventions SDOH Screenings   Food Insecurity: No Food Insecurity (02/12/2023)  Housing: Patient Unable To Answer (02/12/2023)  Transportation Needs: No Transportation Needs (02/12/2023)  Utilities: Not At Risk (02/12/2023)  Tobacco Use: Medium Risk (06/04/2023)    Readmission Risk Interventions    06/04/2023    3:49 PM  Readmission Risk Prevention Plan  Transportation Screening Complete  Medication Review (RN Care Manager) Referral to Pharmacy  PCP or Specialist appointment within 3-5 days of discharge Complete  HRI or Home Care Consult Complete  SW Recovery Care/Counseling Consult Complete  Palliative Care Screening Not Applicable  Skilled Nursing Facility Not Applicable

## 2023-06-17 NOTE — Progress Notes (Signed)
2231-pt continues to have multiple loose stools.  FMS replaced to protect already irritated skin.  Gerhardt's butt balm applied.     0345-FMS found in pt bed.  Pt stated she felt like she needed to "go to the bathroom" and had been straining.  Pt cleaned and balm applied.  FMS not replaced at this time.

## 2023-06-18 ENCOUNTER — Ambulatory Visit: Payer: Medicare HMO | Admitting: Physician Assistant

## 2023-06-18 DIAGNOSIS — J9601 Acute respiratory failure with hypoxia: Secondary | ICD-10-CM | POA: Diagnosis not present

## 2023-06-18 LAB — BPAM RBC
Blood Product Expiration Date: 202409132359
Blood Product Expiration Date: 202409242359
ISSUE DATE / TIME: 202409012058
ISSUE DATE / TIME: 202409031158
Unit Type and Rh: 6200
Unit Type and Rh: 6200

## 2023-06-18 LAB — BASIC METABOLIC PANEL
Anion gap: 11 (ref 5–15)
BUN: 68 mg/dL — ABNORMAL HIGH (ref 8–23)
CO2: 20 mmol/L — ABNORMAL LOW (ref 22–32)
Calcium: 8.9 mg/dL (ref 8.9–10.3)
Chloride: 110 mmol/L (ref 98–111)
Creatinine, Ser: 1.38 mg/dL — ABNORMAL HIGH (ref 0.44–1.00)
GFR, Estimated: 40 mL/min — ABNORMAL LOW (ref >60)
Glucose, Bld: 109 mg/dL — ABNORMAL HIGH (ref 70–99)
Potassium: 4 mmol/L (ref 3.5–5.1)
Sodium: 141 mmol/L (ref 135–145)

## 2023-06-18 LAB — CBC
HCT: 27.3 % — ABNORMAL LOW (ref 36.0–46.0)
Hemoglobin: 8.6 g/dL — ABNORMAL LOW (ref 12.0–15.0)
MCH: 29.1 pg (ref 26.0–34.0)
MCHC: 31.5 g/dL (ref 30.0–36.0)
MCV: 92.2 fL (ref 80.0–100.0)
Platelets: 261 10*3/uL (ref 150–400)
RBC: 2.96 MIL/uL — ABNORMAL LOW (ref 3.87–5.11)
RDW: 16.2 % — ABNORMAL HIGH (ref 11.5–15.5)
WBC: 13.5 10*3/uL — ABNORMAL HIGH (ref 4.0–10.5)
nRBC: 0 % (ref 0.0–0.2)

## 2023-06-18 LAB — TYPE AND SCREEN
ABO/RH(D): A POS
Antibody Screen: NEGATIVE
Unit division: 0
Unit division: 0

## 2023-06-18 LAB — GLUCOSE, CAPILLARY
Glucose-Capillary: 108 mg/dL — ABNORMAL HIGH (ref 70–99)
Glucose-Capillary: 123 mg/dL — ABNORMAL HIGH (ref 70–99)
Glucose-Capillary: 138 mg/dL — ABNORMAL HIGH (ref 70–99)
Glucose-Capillary: 162 mg/dL — ABNORMAL HIGH (ref 70–99)
Glucose-Capillary: 169 mg/dL — ABNORMAL HIGH (ref 70–99)
Glucose-Capillary: 72 mg/dL (ref 70–99)

## 2023-06-18 LAB — MAGNESIUM: Magnesium: 1.9 mg/dL (ref 1.7–2.4)

## 2023-06-18 MED ORDER — PANTOPRAZOLE SODIUM 40 MG PO TBEC
40.0000 mg | DELAYED_RELEASE_TABLET | Freq: Every day | ORAL | Status: DC
  Administered 2023-06-19 – 2023-07-01 (×13): 40 mg via ORAL
  Filled 2023-06-18 (×2): qty 1
  Filled 2023-06-18: qty 2
  Filled 2023-06-18 (×10): qty 1

## 2023-06-18 NOTE — Progress Notes (Signed)
PHARMACIST - PHYSICIAN COMMUNICATION  DR:   Elgergawy  CONCERNING: IV to Oral Route Change Policy  RECOMMENDATION: This patient is receiving Protonix by the intravenous route.  Based on criteria approved by the Pharmacy and Therapeutics Committee, the intravenous medication(s) is/are being converted to the equivalent oral dose form(s).   DESCRIPTION: These criteria include: The patient is eating (either orally or via tube) and/or has been taking other orally administered medications for a least 24 hours The patient has no evidence of active gastrointestinal bleeding or impaired GI absorption (gastrectomy, short bowel, patient on TNA or NPO).  If you have questions about this conversion, please contact the Pharmacy Department  []   [phone] )  [facility] []   [phone] )  [facility] [x]   [phone] )  [facility] []   [phone] )  [facility] []   [phone] )  [facility]

## 2023-06-18 NOTE — Progress Notes (Signed)
Occupational Therapy Treatment Patient Details Name: [name] MRN: [id number] DOB: 1946/08/07 Today's Date: 06/18/2023   History of present illness Pt is 77 year old presented to [facility] on  06/04/23 for acute respiratory failure due to PNA. Intubatd on admission. Extubated 8/28. Pt encephaloathic post extubation. 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   OT comments  Pt with incremental progress toward established OT goals. Obviously uncomfortable and mildly irritated on arrival; attempting to doff SCDs. OT able to assist pt with min A and redirect mood state to pleasant/cooperative with initiation of session. Pt sitting EOB eating ~5 minutes self feeding. Challenging activity tolerance, strength, and balance with STS transfers and side steps toward HOB. MAP observed to be 112 so deferring further activity and RN aware. Will continue to follow. Patient will benefit from continued inpatient follow up therapy, <3 hours/day       If plan is discharge home, recommend the following:  Two people to help with walking and/or transfers;A lot of help with bathing/dressing/bathroom;Assistance with cooking/housework;Direct supervision/assist for medications management;Direct supervision/assist for financial management;Assist for transportation;Help with stairs or ramp for entrance   Equipment Recommendations  Other (comment) (TBD next venue)    Recommendations for Other Services      Precautions / Restrictions Precautions Precautions: Fall Restrictions Weight Bearing Restrictions: No       Mobility Bed Mobility Overal bed mobility: Needs Assistance Bed Mobility: Supine to Sit, Sit to Supine     Supine to sit: Min assist Sit to supine: Mod assist   General bed mobility comments: increased time;  assist for legs    Transfers Overall transfer level: Needs assistance Equipment used: Rolling walker (2 wheels) Transfers: Sit to/from Stand Sit to Stand: Min assist           General transfer comment: 2x with increased time for anterior weight shift and min A to rise. Pt taking 3 steps toward HOB 2 occasions and needing mod cues to avoid premature sitting/for safety with return to sit     Balance Overall balance assessment: Needs assistance Sitting-balance support: No upper extremity supported Sitting balance-Leahy Scale: Fair Sitting balance - Comments: statically   Standing balance support: Bilateral upper extremity supported Standing balance-Leahy Scale: Poor Standing balance comment: reliant on RW and external support                           ADL either performed or assessed with clinical judgement   ADL Overall ADL's : Needs assistance/impaired Eating/Feeding: Set up;Sitting Eating/Feeding Details (indicate cue type and reason): EOB at end of session; D3 nectar thick diet                     Toilet Transfer: Minimal assistance;Stand-pivot;BSC/3in1;Rolling walker (2 wheels);Moderate assistance Toilet Transfer Details (indicate cue type and reason): up to mod A when pt attempting to premature sit Toileting- Clothing Manipulation and Hygiene: Total assistance Toileting - Clothing Manipulation Details (indicate cue type and reason): posterior pericare     Functional mobility during ADLs: Minimal assistance      Extremity/Trunk Assessment Upper Extremity Assessment Upper Extremity Assessment: Generalized weakness   Lower Extremity Assessment Lower Extremity Assessment: Defer to PT evaluation        Vision       Perception  Praxis      Cognition Arousal: Alert Behavior During Therapy: WFL for tasks assessed/performed Overall Cognitive Status: Impaired/Different from baseline Area of Impairment: Attention, Memory, Following  commands, Safety/judgement, Awareness, Problem solving                   Current Attention Level: Sustained Memory: Decreased recall of precautions, Decreased short-term memory Following Commands: Follows one step commands consistently Safety/Judgement: Decreased awareness of safety, Decreased awareness of deficits Awareness: Emergent Problem Solving: Slow processing, Requires verbal cues General Comments: following commands with increased time. Poor awareness of current level of function, slow to initiate. Pt premature sitting 2x this date during transfers requriing mod cues for safety        Exercises      Shoulder Instructions       General Comments Pt very uncomfortable looking and slightly agitated on arrival attempting to doff SCDs. redirectable with time and cues.    Pertinent Vitals/ Pain       Pain Assessment Pain Assessment: Faces Faces Pain Scale: Hurts little more Pain Location: bottom Pain Descriptors / Indicators: Sore Pain Intervention(s): Limited activity within patient's tolerance, Monitored during session  Home Living                                          Prior Functioning/Environment              Frequency  Min 2X/week        Progress Toward Goals  OT Goals(current goals can now be found in the care plan section)  Progress towards OT goals: Progressing toward goals  Acute Rehab OT Goals Patient Stated Goal: get better OT Goal Formulation: With patient Time For Goal Achievement: 06/30/23 Potential to Achieve Goals: Good ADL Goals Pt Will Perform Grooming: with supervision;standing Pt Will Perform Lower Body Dressing: sit to/from stand;with supervision Pt Will Transfer to Toilet: with contact guard assist;ambulating;regular height toilet Additional ADL Goal #1: Pt will follow 3 step commands with increased time.  Plan Discharge plan remains appropriate    Co-evaluation                 AM-PAC OT "6  Clicks" Daily Activity     Outcome Measure   Help from another person eating meals?: A Little Help from another person taking care of personal grooming?: A Little Help from another person toileting, which includes using toliet, bedpan, or urinal?: A Lot Help from another person bathing (including washing, rinsing, drying)?: A Lot Help from another person to put on and taking off regular upper body clothing?: A Little Help from another person to put on and taking off regular lower body clothing?: A Lot 6 Click Score: 15    End of Session Equipment Utilized During Treatment: Gait belt  OT Visit Diagnosis: Unsteadiness on feet (R26.81);Muscle weakness (generalized) (M62.81);Other symptoms and signs involving cognitive function   Activity Tolerance Patient tolerated treatment well   Patient Left in bed;with call bell/phone within reach;with bed alarm set (bed in chair position)   Nurse Communication Mobility status        Time: 9528-4132 OT Time Calculation (min): 26 min  Charges: OT General Charges $OT Visit: 1 Visit OT Treatments $Self Care/Home Management : 8-22 mins $Therapeutic Activity: 8-22 mins  [name], OTD, OTR/L [facility] Acute Rehabilitation Office: [phone]   [name] 06/18/2023, 5:22 PM

## 2023-06-18 NOTE — Progress Notes (Signed)
PROGRESS NOTE    [name]  NWG:[id number] DOB: [birth year] DOA: 06/04/2023 PCP: Garlan Fillers, MD   Chief Complaint  Patient presents with   Respiratory Distress    Brief Narrative:   77 year old with history of recurrent pneumonia, vitamin B12 deficiency, CVA, seizure, HTN, GERD, diabetes, DVT admitted for shortness of breath. Initially in the ER severely hypoxic requiring BiPAP but eventually had to be intubated on 8/21. Initially started on vancomycin, steroids IV Rocephin. Eventually completed 7 days of Rocephin, 5 days of steroids and she was extubated on 8/28. Postextubation developed encephalopathy which improved over the course of 3-4 days. Speech and swallow performed MBS, and D3 diet recommended.     Significant Hospital Events: Including procedures, antibiotic start and stop dates in addition to other pertinent events   8/21 admitted, intubated 8/22 Remains on vent with some issues with agitation, added Precedex drip  8/24 agitated on trying to reduce sedation 8/25-agitation on reducing sedation 8/26 ABG with hypercapnia and hypercarbia, fentanyl increased and propofol maintained for better sedation , very dyssynchronous to vent.  Switched from precedex to propofol 8/27 poorly responsive with tachycardia, HTN and vent dyssynchrony, tolerated PSV better  8/28 sedation stopped/ extubated, hypertensive started on cleviprex 8/29 remains stable postextubation, mentation improving and off Precedex.  Diuresing well 8/30 continues to remain significantly encephalopathic, withdraws to verbal stimuli but will not follow any commands 8/31 no acute issues overnight, remains encephalopathic but slightly more alert this a.m 9/1 no acute events overnight, mentation much improved this a.m. alert and oriented x 2  Assessment & Plan:   Principal Problem:   Acute respiratory failure with hypoxia (HCC)    Acute hypoxic respiratory failure, improved Sepsis secondary to  community-acquired pneumonia History of OSA on CPAP -Initially intubated on admission 8/21, extubated 8/28.  Completed antibiotic and steroid course.  Now extubated.  I-S/flutter valve.  As needed bronchodilators, I-S/flutter valve   Acute metabolic encephalopathy, Resolved.  - There was concerns of ICU delirium.  BUN was also elevated.  EEG, global cerebral dysfunction but no seizures.  -Patient continues to improve   Dysphagia - Had aspiration on recent esophagram with multiple pneumonias over the past year.  Seen by speech and swallow, moderate aspiration risk.  Status post MBS.  D3 diet.  P.o. intake not adequate, continue NG tube, transition to nocturnal feeding to allow daytime p.o. intake -She is advanced to dysphagia 3, but oral intake remains poor, continue to monitor nocturnal tube feed.   Diarrhea -Suspect from tube feeds.  C. difficile negative.   Hypernatremia - Resolved   Acute kidney injury on CKD stage IV -Baseline creatinine 1.5.  Admission creatinine 2.17.  Improved with IV fluids   History of seizures -Keppra   Acute on chronic anemia thought to be delusional -Baseline hemoglobin around 8.5.  Had drifted down to 6.9 requiring 1 unit PRBC transfusion on 9/1.  Hemoglobin 7.5, will give additional unit to keep above 8.0. No signs of any bleeding.    CAD status post PCI 02/2023 Essential hypertension -On aspirin, statin, Plavix, Lopressor.  Echocardiogram shows preserved EF with grade 1 DD   GERD -PPI   Depression -Currently p.o. medicines on hold until mentation remains consistently stable     DVT prophylaxis: Subcu heparin on hold since 8/24 due to significant bruising, and oozing at injection site, will continue to hold for now, continue with SCDs Code Status: Full code Family Communication: None at bedside Disposition:   Status is: Inpatient  Consultants:  PCCM   Subjective: No significant events overnight as discussed with staff, she remains  with poor appetite  Objective: Vitals:   06/18/23 0411 06/18/23 0800 06/18/23 0805 06/18/23 1207  BP: 108/81 106/85 106/85   Pulse: 79 77 78   Resp: (!) 22 14 16    Temp: (!) 97.5 F (36.4 C)  97.9 F (36.6 C) (!) 97.2 F (36.2 C)  TempSrc: Oral  Oral Axillary  SpO2: 96% 98% 96%   Weight: 80.3 kg     Height:        Intake/Output Summary (Last 24 hours) at 06/18/2023 1356 Last data filed at 06/18/2023 0900 Gross per 24 hour  Intake 454 ml  Output 400 ml  Net 54 ml   Filed Weights   06/14/23 0158 06/16/23 0500 06/18/23 0411  Weight: 78 kg 79.3 kg 80.3 kg    Examination:  Awake Alert, Oriented X 2, communicative, chronically ill-appearing, significant bruising and ecchymosis in multiple areas Symmetrical Chest wall movement, Good air movement bilaterally, CTAB RRR,No Gallops,Rubs or new Murmurs, No Parasternal Heave +ve B.Sounds, Abd Soft, No tenderness, No rebound - guarding or rigidity. No Cyanosis, Clubbing or edema, No new Rash or bruise       Data Reviewed: I have personally reviewed following labs and imaging studies  CBC: Recent Labs  Lab 06/14/23 1501 06/15/23 1726 06/16/23 0212 06/17/23 0230 06/18/23 0641  WBC 28.2* 27.3* 19.7* 14.5* 13.5*  NEUTROABS 24.5*  --   --   --   --   HGB 7.3* 6.9* 7.9* 7.5* 8.6*  HCT 23.6* 22.7* 25.6* 24.0* 27.3*  MCV 98.3 97.4 95.9 97.2 92.2  PLT 245 288 257 242 261    Basic Metabolic Panel: Recent Labs  Lab 06/12/23 0316 06/13/23 0216 06/14/23 0220 06/14/23 1159 06/15/23 0932 06/16/23 0212 06/17/23 0230 06/18/23 0641  NA 138 144 149* 151* 146* 147* 141 141  K 4.2 4.1 3.6 4.2 3.6 3.5 4.1 4.0  CL 99 104 110  --  109 110 112* 110  CO2 26 27 29   --  25 24 20* 20*  GLUCOSE 201* 153* 111*  --  220* 118* 188* 109*  BUN 83* 98* 92*  --  83* 83* 73* 68*  CREATININE 1.32* 1.52* 1.72*  --  1.36* 1.35* 1.31* 1.38*  CALCIUM 9.3 9.3 9.5  --  8.9 9.4 8.6* 8.9  MG 2.2 2.2 2.3  --   --   --  1.9 1.9  PHOS 3.6 3.9 3.8  --   --    --  3.8  --     GFR: Estimated Creatinine Clearance: 34.8 mL/min (A) (by C-G formula based on SCr of 1.38 mg/dL (H)).  Liver Function Tests: Recent Labs  Lab 06/12/23 0316 06/13/23 0216 06/14/23 0220  ALBUMIN 2.2* 2.3* 2.1*    CBG: Recent Labs  Lab 06/17/23 2148 06/17/23 2316 06/18/23 0410 06/18/23 0803 06/18/23 1207  GLUCAP 143* 126* 138* 72 108*     Recent Results (from the past 240 hour(s))  C Difficile Quick Screen (NO PCR Reflex)     Status: None   Collection Time: 06/17/23  4:52 AM   Specimen: STOOL  Result Value Ref Range Status   C Diff antigen NEGATIVE NEGATIVE Final   C Diff toxin NEGATIVE NEGATIVE Final   C Diff interpretation No C. difficile detected.  Final    Comment: Performed at [facility], [address] [place], [place] [address]  Radiology Studies: No results found.      Scheduled Meds:  amLODipine  10 mg Oral Daily   aspirin  81 mg Oral Daily   atorvastatin  40 mg Oral Daily   Chlorhexidine Gluconate Cloth  6 each Topical Daily   clopidogrel  75 mg Oral Daily   fiber supplement (BANATROL TF)  60 mL Oral TID   Gerhardt's butt cream   Topical QID   hydrALAZINE  50 mg Oral Q8H   insulin aspart  0-20 Units Subcutaneous Q4H   levETIRAcetam  500 mg Oral BID   lipase/protease/amylase  24,000 Units Oral TID AC   melatonin  3 mg Oral QHS   metoprolol tartrate  100 mg Oral BID   mouth rinse  15 mL Mouth Rinse 4 times per day   [START ON 06/19/2023] pantoprazole  40 mg Oral Daily   Vivonex RTF  1,000 mL Per Tube Q24H   Continuous Infusions:  sodium chloride Stopped (06/13/23 0636)     LOS: 14 days        Huey Bienenstock, MD [facility]   To contact the attending provider between 7A-7P or the covering provider during after hours 7P-7A, please log into the web site [email or web address] and access using universal [place] password for that web site. If you do not have the password, please call the hospital  operator.  06/18/2023, 1:56 PM

## 2023-06-18 NOTE — Plan of Care (Signed)

## 2023-06-19 DIAGNOSIS — J9601 Acute respiratory failure with hypoxia: Secondary | ICD-10-CM | POA: Diagnosis not present

## 2023-06-19 DIAGNOSIS — G9340 Encephalopathy, unspecified: Secondary | ICD-10-CM

## 2023-06-19 LAB — GLUCOSE, CAPILLARY
Glucose-Capillary: 151 mg/dL — ABNORMAL HIGH (ref 70–99)
Glucose-Capillary: 91 mg/dL (ref 70–99)
Glucose-Capillary: 122 mg/dL — ABNORMAL HIGH (ref 70–99)
Glucose-Capillary: 115 mg/dL — ABNORMAL HIGH (ref 70–99)
Glucose-Capillary: 176 mg/dL — ABNORMAL HIGH (ref 70–99)

## 2023-06-19 LAB — CBC
HCT: 26.7 % — ABNORMAL LOW (ref 36.0–46.0)
Hemoglobin: 8.4 g/dL — ABNORMAL LOW (ref 12.0–15.0)
MCH: 29.6 pg (ref 26.0–34.0)
MCHC: 31.5 g/dL (ref 30.0–36.0)
MCV: 94 fL (ref 80.0–100.0)
Platelets: 272 10*3/uL (ref 150–400)
RBC: 2.84 MIL/uL — ABNORMAL LOW (ref 3.87–5.11)
RDW: 16.1 % — ABNORMAL HIGH (ref 11.5–15.5)
WBC: 10.7 10*3/uL — ABNORMAL HIGH (ref 4.0–10.5)
nRBC: 0 % (ref 0.0–0.2)

## 2023-06-19 LAB — BLOOD GAS, ARTERIAL
Acid-base deficit: 3.6 mmol/L — ABNORMAL HIGH (ref 0.0–2.0)
Bicarbonate: 20.6 mmol/L (ref 20.0–28.0)
Drawn by: 33176
O2 Saturation: 97.8 %
Patient temperature: 37
pCO2 arterial: 34 mmHg (ref 32–48)
pH, Arterial: 7.39 (ref 7.35–7.45)
pO2, Arterial: 93 mmHg (ref 83–108)

## 2023-06-19 LAB — BASIC METABOLIC PANEL
Anion gap: 9 (ref 5–15)
BUN: 61 mg/dL — ABNORMAL HIGH (ref 8–23)
CO2: 19 mmol/L — ABNORMAL LOW (ref 22–32)
Calcium: 8.8 mg/dL — ABNORMAL LOW (ref 8.9–10.3)
Chloride: 113 mmol/L — ABNORMAL HIGH (ref 98–111)
Creatinine, Ser: 1.45 mg/dL — ABNORMAL HIGH (ref 0.44–1.00)
GFR, Estimated: 37 mL/min — ABNORMAL LOW (ref >60)
Glucose, Bld: 174 mg/dL — ABNORMAL HIGH (ref 70–99)
Potassium: 4.1 mmol/L (ref 3.5–5.1)
Sodium: 141 mmol/L (ref 135–145)

## 2023-06-19 LAB — MAGNESIUM: Magnesium: 2 mg/dL (ref 1.7–2.4)

## 2023-06-19 NOTE — Plan of Care (Signed)

## 2023-06-19 NOTE — Progress Notes (Signed)
PROGRESS NOTE    [name]  ZOX:[id number] DOB: [birth year] DOA: 06/04/2023 PCP: Garlan Fillers, MD   Chief Complaint  Patient presents with   Respiratory Distress    Brief Narrative:   77 year old with history of recurrent pneumonia, vitamin B12 deficiency, CVA, seizure, HTN, GERD, diabetes, DVT admitted for shortness of breath. Initially in the ER severely hypoxic requiring BiPAP but eventually had to be intubated on 8/21. Initially started on vancomycin, steroids IV Rocephin. Eventually completed 7 days of Rocephin, 5 days of steroids and she was extubated on 8/28. Postextubation developed encephalopathy which improved over the course of 3-4 days. Speech and swallow performed MBS, and D3 diet recommended.     Significant Hospital Events:   8/21 admitted, intubated 8/22 Remains on vent with some issues with agitation, added Precedex drip  8/24 agitated on trying to reduce sedation 8/25-agitation on reducing sedation 8/26 ABG with hypercapnia and hypercarbia, fentanyl increased and propofol maintained for better sedation , very dyssynchronous to vent.  Switched from precedex to propofol 8/27 poorly responsive with tachycardia, HTN and vent dyssynchrony, tolerated PSV better  8/28 sedation stopped/ extubated, hypertensive started on cleviprex 8/29 remains stable postextubation, mentation improving and off Precedex.  Diuresing well 8/30 continues to remain significantly encephalopathic, withdraws to verbal stimuli but will not follow any commands 8/31 no acute issues overnight, remains encephalopathic but slightly more alert this a.m 9/1 no acute events overnight, mentation much improved this a.m. alert and oriented x 2   Assessment & Plan:   Principal Problem:   Acute respiratory failure with hypoxia (HCC)    Acute hypoxic respiratory failure, improved Sepsis secondary to community-acquired pneumonia History of OSA on CPAP -Patient required intubation on 8/21, she was  extubated 8/28 -  she remains on 2 L oxygen, remains confused, able to comply with incentive spirometry and flutter valve -Completed 7 days of antibiotics as of 8/27, cultures negative from 8/21 -She remains with significant secretions, requiring frequent suctioning this morning, this has improved during the day. -Completed antibiotic course   Acute metabolic encephalopathy, Resolved.  - There was concerns of ICU delirium.  BUN was also elevated.  EEG, global cerebral dysfunction but no seizures.  -Mentation continues to fluctuate, but overall it is improving  Uremia -BUN peaked at 98, trending down, it is 61 today   Dysphagia - Had aspiration on recent esophagram with multiple pneumonias over the past year.  Seen by speech and swallow, moderate aspiration risk.  Status post MBS.  D3 diet.  P.o. intake not adequate, continue NG tube, transition to nocturnal feeding to allow daytime p.o. intake -She is advanced to dysphagia 3, but oral intake remains poor, continue to monitor nocturnal tube feed.   Diarrhea -Suspect from tube feeds.  C. difficile negative.   Hypernatremia - Resolved   Acute kidney injury on CKD stage IV -Baseline creatinine 1.5.  Admission creatinine 2.17.  Improved with IV fluids   History of seizures -Keppra   Acute on chronic anemia thought to be delusional -Baseline hemoglobin around 8.5.  Had drifted down to 6.9 requiring 1 unit PRBC transfusion on 9/1.  Hemoglobin 7.5, will give additional unit to keep above 8.0. No signs of any bleeding.    CAD status post PCI 02/2023 Essential hypertension -On aspirin, statin, Plavix, Lopressor.  Echocardiogram shows preserved EF with grade 1 DD   GERD -PPI   Depression -Currently p.o. medicines on hold until mentation remains consistently stable     DVT prophylaxis: Subcu heparin  on hold since 8/24 due to significant bruising, and oozing at injection site, this appears to be improving, will resume today.   Code  Status: Full code Family Communication: None at bedside Disposition:   Status is: Inpatient    Consultants:  PCCM   Subjective: No significant events overnight as discussed with staff, patient appears to be more confused today, she remains with very poor appetite despite trying nocturnal feeding,  Objective: Vitals:   06/19/23 0500 06/19/23 0615 06/19/23 0726 06/19/23 1205  BP:  (!) 121/90    Pulse:      Resp:      Temp:      TempSrc:   Oral Oral  SpO2:      Weight: 81 kg     Height:        Intake/Output Summary (Last 24 hours) at 06/19/2023 1541 Last data filed at 06/18/2023 2100 Gross per 24 hour  Intake 60 ml  Output 600 ml  Net -540 ml   Filed Weights   06/16/23 0500 06/18/23 0411 06/19/23 0500  Weight: 79.3 kg 80.3 kg 81 kg    Examination:  Patient open eyes, awake, but she is confused, oriented x 1 today, she is ill-appearing, significantly deconditioned  Well bruises and ecchymosis in different body areas Symmetrical Chest wall movement, Good air movement bilaterally, CTAB RRR,No Gallops,Rubs or new Murmurs, No Parasternal Heave +ve B.Sounds, Abd Soft, No tenderness, No rebound - guarding or rigidity. No Cyanosis, Clubbing or edema, No new Rash or bruise       Data Reviewed: I have personally reviewed following labs and imaging studies  CBC: Recent Labs  Lab 06/14/23 1501 06/15/23 1726 06/16/23 0212 06/17/23 0230 06/18/23 0641 06/19/23 0300  WBC 28.2* 27.3* 19.7* 14.5* 13.5* 10.7*  NEUTROABS 24.5*  --   --   --   --   --   HGB 7.3* 6.9* 7.9* 7.5* 8.6* 8.4*  HCT 23.6* 22.7* 25.6* 24.0* 27.3* 26.7*  MCV 98.3 97.4 95.9 97.2 92.2 94.0  PLT 245 288 257 242 261 272    Basic Metabolic Panel: Recent Labs  Lab 06/13/23 0216 06/14/23 0220 06/14/23 1159 06/15/23 0932 06/16/23 0212 06/17/23 0230 06/18/23 0641 06/19/23 0300  NA 144 149*   < > 146* 147* 141 141 141  K 4.1 3.6   < > 3.6 3.5 4.1 4.0 4.1  CL 104 110  --  109 110 112* 110 113*  CO2  27 29  --  25 24 20* 20* 19*  GLUCOSE 153* 111*  --  220* 118* 188* 109* 174*  BUN 98* 92*  --  83* 83* 73* 68* 61*  CREATININE 1.52* 1.72*  --  1.36* 1.35* 1.31* 1.38* 1.45*  CALCIUM 9.3 9.5  --  8.9 9.4 8.6* 8.9 8.8*  MG 2.2 2.3  --   --   --  1.9 1.9 2.0  PHOS 3.9 3.8  --   --   --  3.8  --   --    < > = values in this interval not displayed.    GFR: Estimated Creatinine Clearance: 33.2 mL/min (A) (by C-G formula based on SCr of 1.45 mg/dL (H)).  Liver Function Tests: Recent Labs  Lab 06/13/23 0216 06/14/23 0220  ALBUMIN 2.3* 2.1*    CBG: Recent Labs  Lab 06/18/23 2342 06/19/23 0407 06/19/23 0724 06/19/23 1157 06/19/23 1535  GLUCAP 169* 151* 91 122* 115*     Recent Results (from the past 240 hour(s))  C Difficile Quick  Screen (NO PCR Reflex)     Status: None   Collection Time: 06/17/23  4:52 AM   Specimen: STOOL  Result Value Ref Range Status   C Diff antigen NEGATIVE NEGATIVE Final   C Diff toxin NEGATIVE NEGATIVE Final   C Diff interpretation No C. difficile detected.  Final    Comment: Performed at [facility], [address], [place], [place] [address]         Radiology Studies: No results found.      Scheduled Meds:  amLODipine  10 mg Oral Daily   aspirin  81 mg Oral Daily   atorvastatin  40 mg Oral Daily   Chlorhexidine Gluconate Cloth  6 each Topical Daily   clopidogrel  75 mg Oral Daily   fiber supplement (BANATROL TF)  60 mL Oral TID   Gerhardt's butt cream   Topical QID   hydrALAZINE  50 mg Oral Q8H   insulin aspart  0-20 Units Subcutaneous Q4H   levETIRAcetam  500 mg Oral BID   lipase/protease/amylase  24,000 Units Oral TID AC   melatonin  3 mg Oral QHS   metoprolol tartrate  100 mg Oral BID   mouth rinse  15 mL Mouth Rinse 4 times per day   pantoprazole  40 mg Oral Daily   Vivonex RTF  1,000 mL Per Tube Q24H   Continuous Infusions:  sodium chloride Stopped (06/13/23 0636)     LOS: 15 days        Huey Bienenstock, MD [facility]   To contact the attending provider between 7A-7P or the covering provider during after hours 7P-7A, please log into the web site [email or web address] and access using universal [place] password for that web site. If you do not have the password, please call the hospital operator.  06/19/2023, 3:41 PM

## 2023-06-19 NOTE — TOC Progression Note (Signed)
Transition of Care [facility]) - Progression Note    Patient Details  Name: [name] MRN: [id number] Date of Birth: 1946/01/24  Transition of Care [facility]) CM/SW Contact  Mearl Latin, LCSW Phone Number: 06/19/2023, 9:33 AM  Clinical Narrative:    Patient transferred to 5W. CSW continuing to follow for SNF placement. Patient still with cortrak.         Expected Discharge Plan and Services                                               Social Determinants of Health (SDOH) Interventions SDOH Screenings   Food Insecurity: No Food Insecurity (02/12/2023)  Housing: Patient Unable To Answer (02/12/2023)  Transportation Needs: No Transportation Needs (02/12/2023)  Utilities: Not At Risk (02/12/2023)  Tobacco Use: Medium Risk (06/04/2023)    Readmission Risk Interventions    06/04/2023    3:49 PM  Readmission Risk Prevention Plan  Transportation Screening Complete  Medication Review (RN Care Manager) Referral to Pharmacy  PCP or Specialist appointment within 3-5 days of discharge Complete  HRI or Home Care Consult Complete  SW Recovery Care/Counseling Consult Complete  Palliative Care Screening Not Applicable  Skilled Nursing Facility Not Applicable

## 2023-06-19 NOTE — Care Management Important Message (Signed)
Important Message  Patient Details  Name: [name] MRN: [id number] Date of Birth: 30-Oct-1945   Medicare Important Message Given:  Yes     Dorena Bodo 06/19/2023, 2:54 PM

## 2023-06-20 DIAGNOSIS — R410 Disorientation, unspecified: Secondary | ICD-10-CM | POA: Diagnosis not present

## 2023-06-20 DIAGNOSIS — I639 Cerebral infarction, unspecified: Secondary | ICD-10-CM | POA: Diagnosis not present

## 2023-06-20 DIAGNOSIS — D649 Anemia, unspecified: Secondary | ICD-10-CM | POA: Diagnosis not present

## 2023-06-20 DIAGNOSIS — J9601 Acute respiratory failure with hypoxia: Secondary | ICD-10-CM | POA: Diagnosis not present

## 2023-06-20 LAB — CBC
HCT: 25.4 % — ABNORMAL LOW (ref 36.0–46.0)
Hemoglobin: 7.9 g/dL — ABNORMAL LOW (ref 12.0–15.0)
MCH: 29.5 pg (ref 26.0–34.0)
MCHC: 31.1 g/dL (ref 30.0–36.0)
MCV: 94.8 fL (ref 80.0–100.0)
Platelets: 270 10*3/uL (ref 150–400)
RBC: 2.68 MIL/uL — ABNORMAL LOW (ref 3.87–5.11)
RDW: 16.2 % — ABNORMAL HIGH (ref 11.5–15.5)
WBC: 10.7 10*3/uL — ABNORMAL HIGH (ref 4.0–10.5)
nRBC: 0 % (ref 0.0–0.2)

## 2023-06-20 LAB — BASIC METABOLIC PANEL
Anion gap: 11 (ref 5–15)
BUN: 58 mg/dL — ABNORMAL HIGH (ref 8–23)
CO2: 18 mmol/L — ABNORMAL LOW (ref 22–32)
Calcium: 8.6 mg/dL — ABNORMAL LOW (ref 8.9–10.3)
Chloride: 109 mmol/L (ref 98–111)
Creatinine, Ser: 1.52 mg/dL — ABNORMAL HIGH (ref 0.44–1.00)
GFR, Estimated: 35 mL/min — ABNORMAL LOW (ref >60)
Glucose, Bld: 150 mg/dL — ABNORMAL HIGH (ref 70–99)
Potassium: 4.3 mmol/L (ref 3.5–5.1)
Sodium: 138 mmol/L (ref 135–145)

## 2023-06-20 LAB — GLUCOSE, CAPILLARY
Glucose-Capillary: 100 mg/dL — ABNORMAL HIGH (ref 70–99)
Glucose-Capillary: 116 mg/dL — ABNORMAL HIGH (ref 70–99)
Glucose-Capillary: 117 mg/dL — ABNORMAL HIGH (ref 70–99)
Glucose-Capillary: 149 mg/dL — ABNORMAL HIGH (ref 70–99)
Glucose-Capillary: 188 mg/dL — ABNORMAL HIGH (ref 70–99)
Glucose-Capillary: 211 mg/dL — ABNORMAL HIGH (ref 70–99)
Glucose-Capillary: 91 mg/dL (ref 70–99)

## 2023-06-20 LAB — MAGNESIUM: Magnesium: 1.9 mg/dL (ref 1.7–2.4)

## 2023-06-20 MED ORDER — HEPARIN SODIUM (PORCINE) 5000 UNIT/ML IJ SOLN
5000.0000 [IU] | Freq: Three times a day (TID) | INTRAMUSCULAR | Status: DC
  Administered 2023-06-20 – 2023-07-01 (×32): 5000 [IU] via SUBCUTANEOUS
  Filled 2023-06-20 (×32): qty 1

## 2023-06-20 NOTE — Plan of Care (Signed)

## 2023-06-20 NOTE — Progress Notes (Signed)
Physical Therapy Treatment Patient Details Name: [name] MRN: [id number] DOB: 12-06-1945 Today's Date: 06/20/2023   History of Present Illness Pt is 77 year old presented [facility]  [facility] for acute respiratory failure due to PNA. Intubatd on admission. Extubated 8/28. Pt encephaloathic post extubation. 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    PT Comments  Pt received in supine, c/o discomfort at bottom and agreeable to work on transfer training OOB to chair. Pt quick to fatigue and attempting to sit prematurely in chair, +2 for safety and she performs functional mobility tasks with up to modA. Pt refusing to take any bites of solid food from her lunch tray, per RN she ate chocolate pudding only. Pt requesting ice water but PTA reminded her she has nectar thick liquids only at this time. Pt continues to benefit from PT services to progress toward functional mobility goals.     If plan is discharge home, recommend the following: A lot of help with bathing/dressing/bathroom;Assist for transportation;Help with stairs or ramp for entrance;Two people to help with walking and/or transfers;Assistance with cooking/housework;Supervision due to cognitive status   Can travel by private vehicle     No  Equipment Recommendations  Other (comment) (defer to next venue of care)    Recommendations for Other Services       Precautions / Restrictions Precautions Precautions: Fall Precaution Comments: cortrak, RUE too painful for BP cuff; fecal mgmt system Restrictions Weight Bearing Restrictions: No     Mobility  Bed Mobility Overal bed mobility: Needs Assistance Bed Mobility: Supine to Sit, Sit to Supine     Supine to sit: Min assist, HOB elevated, Used rails     General bed mobility  comments: increased time; assist for legs and cues for use of bed rail    Transfers Overall transfer level: Needs assistance Equipment used: Rolling walker (2 wheels) Transfers: Sit to/from Stand Sit to Stand: Min assist, +2 safety/equipment   Step pivot transfers: Mod assist, +2 safety/equipment       General transfer comment: EOB>chair on her L side, mod cues for safety and pt sitting prematurely, chair pulled closer by tech. Pt able to perform additional STS from chair height with encouragement, but fatigues quickly after second STS.    Ambulation/Gait                   Stairs             Wheelchair Mobility     Tilt Bed    Modified Rankin (Stroke Patients Only)       Balance Overall balance assessment: Needs assistance Sitting-balance support: No upper extremity supported, Single extremity supported Sitting balance-Leahy Scale: Fair Sitting balance - Comments: statically, pt using bed rail   Standing balance support: Bilateral upper extremity supported Standing balance-Leahy Scale: Poor Standing balance comment: reliant on RW and external support                            Cognition Arousal: Alert Behavior During Therapy: WFL for tasks assessed/performed Overall Cognitive Status: Impaired/Different from baseline Area of Impairment: Attention, Memory, Following commands, Safety/judgement, Awareness, Problem solving                   Current Attention Level: Sustained Memory: Decreased recall  of precautions, Decreased short-term memory Following Commands: Follows one step commands consistently Safety/Judgement: Decreased awareness of safety, Decreased awareness of deficits Awareness: Emergent Problem Solving: Slow processing, Requires verbal cues General Comments: Pt following commands with increased time, eager to get OOB. Poor awareness of current level of function, slow to initiate. Pt premature sitting during transfers  requiring mod cues for safety.        Exercises General Exercises - Lower Extremity Ankle Circles/Pumps: AROM, Both, 5 reps, Seated Long Arc Quad: AROM, Both, 5 reps, Seated (cues for full ROM) Hip Flexion/Marching: AROM, Both, 5 reps, Supine Other Exercises Other Exercises: STS from chair x1 for BLE strengthening Other Exercises: too fatigued for standing hip flexion    General Comments General comments (skin integrity, edema, etc.): Pt requesting to sit in chair wtih feet down and pt set up with tray table, pt requesting ice water but reminded of her swallowing precs and given a couple sips of her nectar thick water. VSS on RA in chair.      Pertinent Vitals/Pain Pain Assessment Pain Assessment: Faces Faces Pain Scale: Hurts even more Pain Location: bottom Pain Descriptors / Indicators: Sore    Home Living                          Prior Function            PT Goals (current goals can now be found in the care plan section) Acute Rehab PT Goals Patient Stated Goal: go home PT Goal Formulation: With patient Time For Goal Achievement: 06/29/23 Progress towards PT goals: Progressing toward goals    Frequency    Min 1X/week      PT Plan      Co-evaluation              AM-PAC PT "6 Clicks" Mobility   Outcome Measure  Help needed turning from your back to your side while in a flat bed without using bedrails?: A Lot (needs rails) Help needed moving from lying on your back to sitting on the side of a flat bed without using bedrails?: A Lot Help needed moving to and from a bed to a chair (including a wheelchair)?: A Lot Help needed standing up from a chair using your arms (e.g., wheelchair or bedside chair)?: A Lot Help needed to walk in hospital room?: Total Help needed climbing 3-5 steps with a railing? : Total 6 Click Score: 10    End of Session Equipment Utilized During Treatment: Gait belt Activity Tolerance: Patient tolerated treatment  well;Patient limited by fatigue Patient left: in chair;with call bell/phone within reach;with chair alarm set;Other (comment) (RN notified pt not allowing leg rest to be elevated, chair alarm activated for safety.) Nurse Communication: Mobility status PT Visit Diagnosis: Unsteadiness on feet (R26.81);Other abnormalities of gait and mobility (R26.89);Muscle weakness (generalized) (M62.81)     Time: 4540-9811 PT Time Calculation (min) (ACUTE ONLY): 29 min  Charges:    $Therapeutic Exercise: 8-22 mins $Therapeutic Activity: 8-22 mins PT General Charges $$ ACUTE PT VISIT: 1 Visit                     Danh Bayus P., PTA Acute Rehabilitation Services Secure Chat Preferred 9a-5:30pm Office: [phone]    Dorathy Kinsman [facility] 06/20/2023, 6:33 PM

## 2023-06-20 NOTE — Progress Notes (Signed)
PROGRESS NOTE    [name]  ZOX:[id number] DOB: 1946/10/02 DOA: 06/04/2023 PCP: Garlan Fillers, MD   Chief Complaint  Patient presents with   Respiratory Distress    Brief Narrative:   77 year old with history of recurrent pneumonia, vitamin B12 deficiency, CVA, seizure, HTN, GERD, diabetes, DVT admitted for shortness of breath. Initially in the ER severely hypoxic requiring BiPAP but eventually had to be intubated on 8/21. Initially started on vancomycin, steroids IV Rocephin. Eventually completed 7 days of Rocephin, 5 days of steroids and she was extubated on 8/28. Postextubation developed encephalopathy which improved over the course of 3-4 days. Speech and swallow performed MBS, and D3 diet recommended.     Significant Hospital Events:   8/21 admitted, intubated 8/22 Remains on vent with some issues with agitation, added Precedex drip  8/24 agitated on trying to reduce sedation 8/25-agitation on reducing sedation 8/26 ABG with hypercapnia and hypercarbia, fentanyl increased and propofol maintained for better sedation , very dyssynchronous to vent.  Switched from precedex to propofol 8/27 poorly responsive with tachycardia, HTN and vent dyssynchrony, tolerated PSV better  8/28 sedation stopped/ extubated, hypertensive started on cleviprex 8/29 remains stable postextubation, mentation improving and off Precedex.  Diuresing well 8/30 continues to remain significantly encephalopathic, withdraws to verbal stimuli but will not follow any commands 8/31 no acute issues overnight, remains encephalopathic but slightly more alert this a.m 9/1 no acute events overnight, mentation much improved this a.m. alert and oriented x 2   Assessment & Plan:   Principal Problem:   Acute respiratory failure with hypoxia (HCC)    Acute hypoxic respiratory failure, improved Sepsis secondary to community-acquired pneumonia History of OSA on CPAP -Patient required intubation on 8/21, she was  extubated 8/28 -  she remains on 2 L oxygen, remains confused, able to comply with incentive spirometry and flutter valve -Completed 7 days of antibiotics as of 8/27, cultures negative from 8/21 -She remains at risk of aspiration, with significant secretions, this appears to be improved today, she was encouraged with incentive spirometry, flutter valve, staff were instructed to do suctioning as needed -Completed antibiotic course   Acute metabolic encephalopathy, Resolved.  - There was concerns of ICU delirium.  BUN was also elevated.  EEG, global cerebral dysfunction but no seizures.  -Mentation continues to fluctuate, but overall it is improving, ABG has been obtained yesterday, as she was more sleepy, pCO2 of 34, no evidence of retention.  Uremia -BUN peaked at 98, trending down, it is down to 58 today.   Dysphagia - Had aspiration on recent esophagram with multiple pneumonias over the past year.  Seen by speech and swallow, moderate aspiration risk.  Status post MBS.  D3 diet.  P.o. intake not adequate, continue NG tube, transition to nocturnal feeding to allow daytime p.o. intake -She is advanced to dysphagia 3, but oral intake remains poor, continue to monitor nocturnal tube feed.   Diarrhea -Suspect from tube feeds.  C. difficile negative.   Hypernatremia - Resolved   Acute kidney injury on CKD stage IV -Baseline creatinine 1.5.  Admission creatinine 2.17.  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  Echocardiogram shows preserved EF with grade 1 DD   GERD -PPI   Depression -Currently p.o. medicines on hold until mentation remains consistently stable  DVT prophylaxis: Subcu heparin on hold since 8/24 due to significant bruising, and oozing at injection site, this appears to be  improving, she is back on subcu heparin Code Status: Full code Family Communication: None at bedside Disposition:   Status is: Inpatient    Consultants:  PCCM   Subjective: No significant events overnight as discussed with staff, she remains with very poor appetite and oral intake  Objective: Vitals:   06/20/23 0830 06/20/23 1144 06/20/23 1200 06/20/23 1318  BP: (!) 130/96 (!) 127/100 128/64 123/61  Pulse: 78 68 70 63  Resp: 19 15 18 19   Temp: (!) 97.3 F (36.3 C) (!) 97.5 F (36.4 C)    TempSrc: Axillary Axillary    SpO2: 94% 94% 94% 96%  Weight:      Height:        Intake/Output Summary (Last 24 hours) at 06/20/2023 1535 Last data filed at 06/20/2023 1300 Gross per 24 hour  Intake 150 ml  Output 500 ml  Net -350 ml   Filed Weights   06/18/23 0411 06/19/23 0500 06/20/23 0500  Weight: 80.3 kg 81 kg 81.2 kg    Examination:  Awake Alert, he is more coherent and conversant today, but remains confused, frail, chronically ill-appearing Symmetrical Chest wall movement, Good air movement bilaterally, CTAB RRR,No Gallops,Rubs or new Murmurs, No Parasternal Heave +ve B.Sounds, Abd Soft, No tenderness, No rebound - guarding or rigidity. No Cyanosis, Clubbing or edema, No new Rash or bruise        Data Reviewed: I have personally reviewed following labs and imaging studies  CBC: Recent Labs  Lab 06/14/23 1501 06/15/23 1726 06/16/23 0212 06/17/23 0230 06/18/23 0641 06/19/23 0300 06/20/23 0312  WBC 28.2*   < > 19.7* 14.5* 13.5* 10.7* 10.7*  NEUTROABS 24.5*  --   --   --   --   --   --   HGB 7.3*   < > 7.9* 7.5* 8.6* 8.4* 7.9*  HCT 23.6*   < > 25.6* 24.0* 27.3* 26.7* 25.4*  MCV 98.3   < > 95.9 97.2 92.2 94.0 94.8  PLT 245   < > 257 242 261 272 270   < > = values in this interval not displayed.    Basic Metabolic Panel: Recent Labs  Lab 06/14/23 0220 06/14/23 1159 06/16/23 0212 06/17/23 0230 06/18/23 0641 06/19/23 0300 06/20/23 0312  NA 149*   < >  147* 141 141 141 138  K 3.6   < > 3.5 4.1 4.0 4.1 4.3  CL 110   < > 110 112* 110 113* 109  CO2 29   < > 24 20* 20* 19* 18*  GLUCOSE 111*   < > 118* 188* 109* 174* 150*  BUN 92*   < > 83* 73* 68* 61* 58*  CREATININE 1.72*   < > 1.35* 1.31* 1.38* 1.45* 1.52*  CALCIUM 9.5   < > 9.4 8.6* 8.9 8.8* 8.6*  MG 2.3  --   --  1.9 1.9 2.0 1.9  PHOS 3.8  --   --  3.8  --   --   --    < > = values in this interval not displayed.    GFR: Estimated Creatinine Clearance: 31.8 mL/min (A) (by C-G formula based on SCr of 1.52 mg/dL (H)).  Liver Function Tests: Recent Labs  Lab 06/14/23 0220  ALBUMIN 2.1*    CBG: Recent Labs  Lab 06/19/23 1951 06/20/23 0005 06/20/23 0356 06/20/23 0845 06/20/23 1146  GLUCAP  176* 149* 188* 91 116*     Recent Results (from the past 240 hour(s))  C Difficile Quick Screen (NO PCR Reflex)     Status: None   Collection Time: 06/17/23  4:52 AM   Specimen: STOOL  Result Value Ref Range Status   C Diff antigen NEGATIVE NEGATIVE Final   C Diff toxin NEGATIVE NEGATIVE Final   C Diff interpretation No C. difficile detected.  Final    Comment: Performed at [facility], [address], [place], [place] [address]         Radiology Studies: No results found.      Scheduled Meds:  amLODipine  10 mg Oral Daily   aspirin  81 mg Oral Daily   atorvastatin  40 mg Oral Daily   Chlorhexidine Gluconate Cloth  6 each Topical Daily   clopidogrel  75 mg Oral Daily   fiber supplement (BANATROL TF)  60 mL Oral TID   Gerhardt's butt cream   Topical QID   hydrALAZINE  50 mg Oral Q8H   insulin aspart  0-20 Units Subcutaneous Q4H   levETIRAcetam  500 mg Oral BID   lipase/protease/amylase  24,000 Units Oral TID AC   melatonin  3 mg Oral QHS   metoprolol tartrate  100 mg Oral BID   mouth rinse  15 mL Mouth Rinse 4 times per day   pantoprazole  40 mg Oral Daily   Vivonex RTF  1,000 mL Per Tube Q24H   Continuous Infusions:  sodium chloride Stopped (06/13/23  0636)     LOS: 16 days        Huey Bienenstock, MD [facility]   To contact the attending provider between 7A-7P or the covering provider during after hours 7P-7A, please log into the web site [email or web address] and access using universal [place] password for that web site. If you do not have the password, please call the hospital operator.  06/20/2023, 3:35 PM

## 2023-06-20 NOTE — Progress Notes (Signed)
Nutrition Follow-up  DOCUMENTATION CODES:   Not applicable  INTERVENTION:   - 48-hour calorie count to start tomorrow, 06/21/23, at breakfast meal; RD to follow up with results on Monday, 06/23/23  Continue nocturnal tube feeds via Cortrak: - Vivonex RTF @ 83 ml/hr x 12 hours from 1800 to 0600 (total of 996 ml nightly)  Nocturnal tube feeding regimen provides 996 kcal, 50 grams of protein, and 845 ml of H2O (meets 60% of minimum kcal needs and 59% of minimum protein needs).  - Continue Banatrol TF TID per tube  - Continue Mighty Shake TID with meals, each supplement provides 330 kcals and 9 grams of protein  - Encourage PO intake and provide feeding assistance as needed  NUTRITION DIAGNOSIS:   Inadequate oral intake related to inability to eat as evidenced by NPO status.  Progressing, being addressed via PO diet, oral nutrition supplements, and nocturnal tube feeds  GOAL:   Patient will meet greater than or equal to 90% of their needs  Progressing, being addressed via PO diet, oral nutrition supplements, and nocturnal tube feeds  MONITOR:   PO intake, Supplement acceptance, Diet advancement, Labs, Weight trends, TF tolerance, I & O's  REASON FOR ASSESSMENT:   Ventilator, Consult Enteral/tube feeding initiation and management  ASSESSMENT:   77 year old female who presented to the ED on 8/21 with respiratory distress. PMH of recurrent pneumonias, vitamin B12 deficiency, stroke, seizures, HTN, GERD, DM, DVT, chronic pancreatitis, CAD, colostomy. Pt admitted with pneumonia, sepsis.  8/21 - intubated, intermittent TF via pump started 8/22 - pt with small volume emesis, changed to continuous TF with Vivonex 8/23 - NG tube exchanged for Cortrak (tip gastric) 8/28 - extubated 9/02 - MBS, diet advanced to dysphagia 3 with nectar-thick liquids 9/03 - TF changed to nocturnal  Spoke with pt at bedside. Pt somewhat confused at time of RD visit, asking to get out of bed but also to  get into bed. Pt complains of pain in buttocks. Noted lunch meal tray in room; pt had consumed 100% of a chocolate pudding and a few sips of Mighty Shake.  Discussed pt with RN and with MD. RN reports pt states that she is not hungry and will only eat when she is given significant encouragement. Pt continues to have multiple type 6 bowel movements daily per RN, despite being on banatrol TF TID.  Will initiate 48-hour calorie count over the weekend. RD has hung calorie count envelope on pt's door. RN aware of plan.  Admit weight: 68 kg (likely estimated) Current weight: 81.2 kg  Current TF: Vivonex @ 83 ml/hr x 12 hours from 1800 to 0600  Meal Completion: 5-25%  Medications reviewed and include: banatrol TF TID, SSI every 4 hours, Creon 24,000 units TID before meals, melatonin, protonix  Labs reviewed: BUN 58, creatinine 1.52, WBC 10.7, hemoglobin 7.9 CBG's: 91-188 x 24 hours  Diet Order:   Diet Order             DIET DYS 3 Room service appropriate? Yes with Assist; Fluid consistency: Nectar Thick  Diet effective now                   EDUCATION NEEDS:   Not appropriate for education at this time  Skin:  Skin Assessment: Reviewed RN Assessment (skin tear buttocks)  Last BM:  06/20/23  Height:   Ht Readings from Last 1 Encounters:  06/04/23 5\' 3"  (1.6 m)    Weight:   Wt Readings from Last 1  Encounters:  06/20/23 81.2 kg    Ideal Body Weight:  52.3 kg  BMI:  Body mass index is 31.71 kg/m.  Estimated Nutritional Needs:   Kcal:  1650-1850  Protein:  85-100 grams  Fluid:  1.6-1.8 L    Mertie Clause, MS, RD, LDN Registered Dietitian II Please see AMiON for contact information.

## 2023-06-20 NOTE — Progress Notes (Addendum)
Speech Language Pathology Treatment: Dysphagia  Patient Details Name: [name] MRN: [id number] DOB: [birth year] Today's Date: 06/20/2023 Time: 4098-1191 SLP Time Calculation (min) (ACUTE ONLY): 21 min  Assessment / Plan / Recommendation Clinical Impression  Pt seen for dysphagia treatment. RN stated she did not want breakfast quite yet this morning but did take graham cracker and nectar thick liquids from therapist. Pt appears weak, deconditioned, weak volitional cough. Oral care removed dried secretions. Mastication was prolonged with anterior lingual residue noted reduced with sips nectar. There were no s/s aspiration with nectar. Recommend continue Dys 3/nectar Initiated EMST (expiratory muscle strength training) with device set to 15 cn H20 resistance was too high and turned down to approximately 8 cm H20 and pt was able to perform 4 sets of 5 repetitions needing cues as she appeared fatigued with increased work of breathing. Pt encouraged to use device minimum of 2 times a day for 5-10 repetitions as able and asked RN to encourage pt as she needs assist to initiate.    HPI HPI: Pt is a 77 yo female presenting 8/21 with shortness of breath, cough, hypoxia. ETT 8/21-8/28. Pt was seen by SLP most recently in July 2023, recommending regular solids and thin liquids but noting lingual abnormalities and reduced ROM secondary to lingual trauma). Esophagram (November 2021) showed penetration of thin liquids, sensed aspiration with thicker barium, mild esophageal dysmotility, and HH with mild narrowing of the GE junction.   FEES at outside hospital in 2019 [facility]. Imaging from [facility] in 2019 reviewed (report not available) with transient penetration and vallecular residue noted. PMH includes: GERD, stroke, recurrent PNA, seizures, sleep apnea, DM, HTN, DVT, B12 deficiency, CAD, colostomy, chronic pancreatitis      SLP Plan  Continue with current plan of care      Recommendations for follow up therapy are  one component of a multi-disciplinary discharge planning process, led by the attending physician.  Recommendations may be updated based on patient status, additional functional criteria and insurance authorization.    Recommendations  Diet recommendations: Dysphagia 3 (mechanical soft);Nectar-thick liquid Liquids provided via: Cup;Straw Medication Administration: Whole meds with liquid Supervision: Staff to assist with self feeding Compensations: Slow rate;Small sips/bites Postural Changes and/or Swallow Maneuvers: Seated upright 90 degrees;Upright 30-60 min after meal                  Oral care BID   Frequent or constant Supervision/Assistance Dysphagia, unspecified (R13.10)     Continue with current plan of care     Royce Macadamia  06/20/2023, 9:22 AM

## 2023-06-21 ENCOUNTER — Inpatient Hospital Stay (HOSPITAL_COMMUNITY): Payer: Medicare HMO

## 2023-06-21 DIAGNOSIS — J9601 Acute respiratory failure with hypoxia: Secondary | ICD-10-CM | POA: Diagnosis not present

## 2023-06-21 DIAGNOSIS — R410 Disorientation, unspecified: Secondary | ICD-10-CM | POA: Diagnosis not present

## 2023-06-21 DIAGNOSIS — I639 Cerebral infarction, unspecified: Secondary | ICD-10-CM | POA: Diagnosis not present

## 2023-06-21 DIAGNOSIS — G3189 Other specified degenerative diseases of nervous system: Secondary | ICD-10-CM | POA: Diagnosis not present

## 2023-06-21 DIAGNOSIS — D649 Anemia, unspecified: Secondary | ICD-10-CM | POA: Diagnosis not present

## 2023-06-21 LAB — BASIC METABOLIC PANEL
Anion gap: 8 (ref 5–15)
BUN: 53 mg/dL — ABNORMAL HIGH (ref 8–23)
CO2: 19 mmol/L — ABNORMAL LOW (ref 22–32)
Calcium: 8.3 mg/dL — ABNORMAL LOW (ref 8.9–10.3)
Chloride: 112 mmol/L — ABNORMAL HIGH (ref 98–111)
Creatinine, Ser: 1.59 mg/dL — ABNORMAL HIGH (ref 0.44–1.00)
GFR, Estimated: 33 mL/min — ABNORMAL LOW (ref >60)
Glucose, Bld: 147 mg/dL — ABNORMAL HIGH (ref 70–99)
Potassium: 4.2 mmol/L (ref 3.5–5.1)
Sodium: 139 mmol/L (ref 135–145)

## 2023-06-21 LAB — GLUCOSE, CAPILLARY
Glucose-Capillary: 203 mg/dL — ABNORMAL HIGH (ref 70–99)
Glucose-Capillary: 48 mg/dL — ABNORMAL LOW (ref 70–99)
Glucose-Capillary: 50 mg/dL — ABNORMAL LOW (ref 70–99)
Glucose-Capillary: 156 mg/dL — ABNORMAL HIGH (ref 70–99)
Glucose-Capillary: 101 mg/dL — ABNORMAL HIGH (ref 70–99)
Glucose-Capillary: 100 mg/dL — ABNORMAL HIGH (ref 70–99)
Glucose-Capillary: 188 mg/dL — ABNORMAL HIGH (ref 70–99)

## 2023-06-21 LAB — CBC
HCT: 24.4 % — ABNORMAL LOW (ref 36.0–46.0)
Hemoglobin: 7.7 g/dL — ABNORMAL LOW (ref 12.0–15.0)
MCH: 30.8 pg (ref 26.0–34.0)
MCHC: 31.6 g/dL (ref 30.0–36.0)
MCV: 97.6 fL (ref 80.0–100.0)
Platelets: 263 10*3/uL (ref 150–400)
RBC: 2.5 MIL/uL — ABNORMAL LOW (ref 3.87–5.11)
RDW: 16.1 % — ABNORMAL HIGH (ref 11.5–15.5)
WBC: 8.1 10*3/uL (ref 4.0–10.5)
nRBC: 0 % (ref 0.0–0.2)

## 2023-06-21 LAB — MAGNESIUM: Magnesium: 2 mg/dL (ref 1.7–2.4)

## 2023-06-21 MED ORDER — DEXTROSE 50 % IV SOLN: 25.0000 g | INTRAVENOUS | Status: AC

## 2023-06-21 NOTE — Plan of Care (Signed)
  Problem: Education: Goal: Knowledge of General Education information will improve Description: Including pain rating scale, medication(s)/side effects and non-pharmacologic comfort measures Outcome: Progressing   Problem: Health Behavior/Discharge Planning: Goal: Ability to manage health-related needs will improve Outcome: Progressing   Problem: Clinical Measurements: Goal: Will remain free from infection Outcome: Progressing Goal: Diagnostic test results will improve Outcome: Progressing   Problem: Nutrition: Goal: Adequate nutrition will be maintained Outcome: Progressing   Problem: Coping: Goal: Level of anxiety will decrease Outcome: Progressing   Problem: Elimination: Goal: Will not experience complications related to bowel motility Outcome: Progressing   Problem: Pain Managment: Goal: General experience of comfort will improve Outcome: Progressing   Problem: Safety: Goal: Ability to remain free from injury will improve Outcome: Progressing

## 2023-06-21 NOTE — Progress Notes (Addendum)
PROGRESS NOTE    [name]  MWN:[id number] DOB: 1946-07-04 DOA: 06/04/2023 PCP: Garlan Fillers, MD   Chief Complaint  Patient presents with   Respiratory Distress    Brief Narrative:   77 year old with history of recurrent pneumonia, vitamin B12 deficiency, CVA, seizure, HTN, GERD, diabetes, DVT admitted for shortness of breath. Initially in the ER severely hypoxic requiring BiPAP but eventually had to be intubated on 8/21. Initially started on vancomycin, steroids IV Rocephin. Eventually completed 7 days of Rocephin, 5 days of steroids and she was extubated on 8/28. Postextubation developed encephalopathy which improved over the course of 3-4 days. Speech and swallow performed MBS, and D3 diet recommended.     Significant Hospital Events:   8/21 admitted, intubated 8/22 Remains on vent with some issues with agitation, added Precedex drip  8/24 agitated on trying to reduce sedation 8/25-agitation on reducing sedation 8/26 ABG with hypercapnia and hypercarbia, fentanyl increased and propofol maintained for better sedation , very dyssynchronous to vent.  Switched from precedex to propofol 8/27 poorly responsive with tachycardia, HTN and vent dyssynchrony, tolerated PSV better  8/28 sedation stopped/ extubated, hypertensive started on cleviprex 8/29 remains stable postextubation, mentation improving and off Precedex.  Diuresing well 8/30 continues to remain significantly encephalopathic, withdraws to verbal stimuli but will not follow any commands 8/31 no acute issues overnight, remains encephalopathic but slightly more alert this a.m 9/1 no acute events overnight, mentation much improved this a.m. alert and oriented x 2   Assessment & Plan:   Principal Problem:   Acute respiratory failure with hypoxia (HCC)    Acute hypoxic respiratory failure, improved Sepsis secondary to community-acquired pneumonia History of OSA on CPAP -Patient required intubation on 8/21, she was  extubated 8/28 -  she remains on 2 L oxygen, remains confused, able to comply with incentive spirometry and flutter valve -Completed 7 days of antibiotics as of 8/27, cultures negative from 8/21 -She remains at risk of aspiration, with significant secretions, this appears to be improved today, she was encouraged with incentive spirometry, flutter valve, staff were instructed to do suctioning as needed -Completed antibiotic course   Acute metabolic encephalopathy, Resolved.  - There was concerns of ICU delirium.  BUN was also elevated.  EEG, global cerebral dysfunction but no seizures.  -Mentation continues to fluctuate, but overall it is improving, ABG has been obtained y, as she was more sleepy, pCO2 of 34, no evidence of retention. -Patient remains significantly confused few days post her extubation, far from her baseline as discussed with son, will proceed with MRI brain given known significant atherosclerotic disease to rule out any CVA.  Uremia -BUN peaked at 98, trending down, it is down to 53 today.   Dysphagia - Had aspiration on recent esophagram with multiple pneumonias over the past year.  Seen by speech and swallow, moderate aspiration risk.  Status post MBS.  D3 diet.  P.o. intake not adequate, continue NG tube, transition to nocturnal feeding to allow daytime p.o. intake -She is advanced to dysphagia 3, but oral intake remains poor, continue to monitor nocturnal tube feed. -Patient with hypoglycemia this morning, will DC insulin sliding scale and keep monitoring CBG   Diarrhea -Suspect from tube feeds.  C. difficile negative.   Hypernatremia - Resolved   Acute kidney injury on CKD stage IV -Baseline creatinine 1.5.  Admission creatinine 2.17.  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  Echocardiogram shows preserved EF with grade 1 DD   GERD -PPI   Depression -Currently p.o. medicines on hold until mentation remains consistently stable     DVT prophylaxis: Subcu heparin on hold since 8/24 due to significant bruising, and oozing at injection site, this appears to be improving, she is back on subcu heparin Code Status: Full code Family Communication: discussed with son by phone Disposition:   Status is: Inpatient    Consultants:  PCCM   Subjective:  she remains with very poor appetite and oral intake, she remains with diarrhea as discussed with staff  Objective: Vitals:   06/20/23 2326 06/21/23 0410 06/21/23 0606 06/21/23 1206  BP: 90/63  (!) 108/97 (!) 106/54  Pulse: 74 70  64  Resp: 14 17  15   Temp: 98.1 F (36.7 C) (!) 97.4 F (36.3 C)  (!) 97.2 F (36.2 C)  TempSrc: Axillary Axillary    SpO2: 96% 97%  93%  Weight:      Height:        Intake/Output Summary (Last 24 hours) at 06/21/2023 1412 Last data filed at 06/20/2023 1900 Gross per 24 hour  Intake 203 ml  Output --  Net 203 ml   Filed Weights   06/18/23 0411 06/19/23 0500 06/20/23 0500  Weight: 80.3 kg 81 kg 81.2 kg    Examination:  Awake Alert, he is more coherent and conversant today, but remains confused, frail, chronically ill-appearing Symmetrical Chest wall movement, Good air movement bilaterally, CTAB RRR,No Gallops,Rubs or new Murmurs, No Parasternal Heave +ve B.Sounds, Abd Soft, No tenderness, No rebound - guarding or rigidity. No Cyanosis, Clubbing or edema, No new Rash or bruise        Data Reviewed: I have personally reviewed following labs and imaging studies  CBC: Recent Labs  Lab 06/14/23 1501 06/15/23 1726 06/17/23 0230 06/18/23 0641 06/19/23 0300 06/20/23 0312 06/21/23 0859  WBC 28.2*   < > 14.5* 13.5* 10.7* 10.7* 8.1  NEUTROABS 24.5*  --   --   --   --   --   --   HGB 7.3*   < > 7.5* 8.6* 8.4* 7.9* 7.7*  HCT 23.6*   < > 24.0* 27.3* 26.7*  25.4* 24.4*  MCV 98.3   < > 97.2 92.2 94.0 94.8 97.6  PLT 245   < > 242 261 272 270 263   < > = values in this interval not displayed.    Basic Metabolic Panel: Recent Labs  Lab 06/17/23 0230 06/18/23 0641 06/19/23 0300 06/20/23 0312 06/21/23 0859  NA 141 141 141 138 139  K 4.1 4.0 4.1 4.3 4.2  CL 112* 110 113* 109 112*  CO2 20* 20* 19* 18* 19*  GLUCOSE 188* 109* 174* 150* 147*  BUN 73* 68* 61* 58* 53*  CREATININE 1.31* 1.38* 1.45* 1.52* 1.59*  CALCIUM 8.6* 8.9 8.8* 8.6* 8.3*  MG 1.9 1.9 2.0 1.9 2.0  PHOS 3.8  --   --   --   --     GFR: Estimated Creatinine Clearance: 30.4 mL/min (A) (by C-G formula based on SCr of 1.59 mg/dL (H)).  Liver Function Tests: No results for input(s): "AST", "ALT", "ALKPHOS", "BILITOT", "PROT", "ALBUMIN" in the last 168 hours.   CBG: Recent Labs  Lab 06/21/23 0419 06/21/23 0801 06/21/23 0804 06/21/23 0906 06/21/23 1214  GLUCAP 203* 50* 48* 156* 101*  Recent Results (from the past 240 hour(s))  C Difficile Quick Screen (NO PCR Reflex)     Status: None   Collection Time: 06/17/23  4:52 AM   Specimen: STOOL  Result Value Ref Range Status   C Diff antigen NEGATIVE NEGATIVE Final   C Diff toxin NEGATIVE NEGATIVE Final   C Diff interpretation No C. difficile detected.  Final    Comment: Performed at [facility], [address], [place], [place] [address]         Radiology Studies: No results found.      Scheduled Meds:  amLODipine  10 mg Oral Daily   aspirin  81 mg Oral Daily   atorvastatin  40 mg Oral Daily   Chlorhexidine Gluconate Cloth  6 each Topical Daily   clopidogrel  75 mg Oral Daily   dextrose  25 g Intravenous STAT   fiber supplement (BANATROL TF)  60 mL Oral TID   Gerhardt's butt cream   Topical QID   heparin injection (subcutaneous)  5,000 Units Subcutaneous Q8H   hydrALAZINE  50 mg Oral Q8H   levETIRAcetam  500 mg Oral BID   lipase/protease/amylase  24,000 Units Oral TID AC   melatonin  3 mg  Oral QHS   metoprolol tartrate  100 mg Oral BID   mouth rinse  15 mL Mouth Rinse 4 times per day   pantoprazole  40 mg Oral Daily   Vivonex RTF  1,000 mL Per Tube Q24H   Continuous Infusions:  sodium chloride Stopped (06/13/23 0636)     LOS: 17 days        Huey Bienenstock, MD [facility]   To contact the attending provider between 7A-7P or the covering provider during after hours 7P-7A, please log into the web site [email or web address] and access using universal [place] password for that web site. If you do not have the password, please call the hospital operator.  06/21/2023, 2:12 PM

## 2023-06-22 ENCOUNTER — Encounter (HOSPITAL_COMMUNITY): Payer: Medicare HMO

## 2023-06-22 ENCOUNTER — Inpatient Hospital Stay (HOSPITAL_COMMUNITY): Payer: Medicare HMO

## 2023-06-22 DIAGNOSIS — I639 Cerebral infarction, unspecified: Secondary | ICD-10-CM | POA: Diagnosis not present

## 2023-06-22 DIAGNOSIS — R410 Disorientation, unspecified: Secondary | ICD-10-CM | POA: Diagnosis not present

## 2023-06-22 DIAGNOSIS — I6522 Occlusion and stenosis of left carotid artery: Secondary | ICD-10-CM | POA: Diagnosis not present

## 2023-06-22 DIAGNOSIS — D649 Anemia, unspecified: Secondary | ICD-10-CM | POA: Diagnosis not present

## 2023-06-22 DIAGNOSIS — J9601 Acute respiratory failure with hypoxia: Secondary | ICD-10-CM | POA: Diagnosis not present

## 2023-06-22 LAB — MAGNESIUM: Magnesium: 1.9 mg/dL (ref 1.7–2.4)

## 2023-06-22 LAB — BASIC METABOLIC PANEL
Anion gap: 8 (ref 5–15)
BUN: 50 mg/dL — ABNORMAL HIGH (ref 8–23)
CO2: 20 mmol/L — ABNORMAL LOW (ref 22–32)
Calcium: 8.5 mg/dL — ABNORMAL LOW (ref 8.9–10.3)
Chloride: 109 mmol/L (ref 98–111)
Creatinine, Ser: 1.57 mg/dL — ABNORMAL HIGH (ref 0.44–1.00)
GFR, Estimated: 34 mL/min — ABNORMAL LOW (ref >60)
Glucose, Bld: 122 mg/dL — ABNORMAL HIGH (ref 70–99)
Potassium: 4.3 mmol/L (ref 3.5–5.1)
Sodium: 137 mmol/L (ref 135–145)

## 2023-06-22 LAB — LIPID PANEL
Cholesterol: 68 mg/dL (ref 0–200)
HDL: 32 mg/dL — ABNORMAL LOW (ref >40)
LDL Cholesterol: 23 mg/dL (ref 0–99)
Total CHOL/HDL Ratio: 2.1 ratio
Triglycerides: 63 mg/dL (ref <150)
VLDL: 13 mg/dL (ref 0–40)

## 2023-06-22 LAB — GLUCOSE, CAPILLARY
Glucose-Capillary: 193 mg/dL — ABNORMAL HIGH (ref 70–99)
Glucose-Capillary: 190 mg/dL — ABNORMAL HIGH (ref 70–99)
Glucose-Capillary: 121 mg/dL — ABNORMAL HIGH (ref 70–99)
Glucose-Capillary: 110 mg/dL — ABNORMAL HIGH (ref 70–99)
Glucose-Capillary: 103 mg/dL — ABNORMAL HIGH (ref 70–99)
Glucose-Capillary: 175 mg/dL — ABNORMAL HIGH (ref 70–99)

## 2023-06-22 LAB — CBC
HCT: 25.8 % — ABNORMAL LOW (ref 36.0–46.0)
Hemoglobin: 8.2 g/dL — ABNORMAL LOW (ref 12.0–15.0)
MCH: 29.8 pg (ref 26.0–34.0)
MCHC: 31.8 g/dL (ref 30.0–36.0)
MCV: 93.8 fL (ref 80.0–100.0)
Platelets: 289 10*3/uL (ref 150–400)
RBC: 2.75 MIL/uL — ABNORMAL LOW (ref 3.87–5.11)
RDW: 16.2 % — ABNORMAL HIGH (ref 11.5–15.5)
WBC: 7.4 10*3/uL (ref 4.0–10.5)
nRBC: 0 % (ref 0.0–0.2)

## 2023-06-22 LAB — HEMOGLOBIN A1C
Hgb A1c MFr Bld: 6 % — ABNORMAL HIGH (ref 4.8–5.6)
Mean Plasma Glucose: 125.5 mg/dL

## 2023-06-22 NOTE — Consult Note (Signed)
Neurology Consultation  Reason for Consult: stroke on MRI Referring Physician: Elgergawy   CC:  History is obtained from: patient and past medical records  HPI: [name] is a 77 y.o. female with a past medical history of recurrent pneumonia, vitamin B12 deficiency, CVA, seizure, HTN, GERD, diabetes, DVT admitted for shortness of breath and subsequently required intubation. She completed 7 days of Rocephin, 5 days of steroids and she was extubated on 8/28. Postextubation developed encephalopathy which improved over the course of 3-4 days.  MRI shows an subacute ischemic infarct in the left parietal lobe. On exam she appears to have a right facial droop, generalized weakness, and fatigue. She is resistant to exam and requires repeated stimulation to stay awake. Cortrak in place, currently on dysphagia 3 diet.    Significant Hospital Events 8/21 admitted, intubated 8/22 Remains on vent with some issues with agitation, added Precedex drip  8/24 agitated on trying to reduce sedation 8/25-agitation on reducing sedation 8/26 ABG with hypercapnia and hypercarbia, fentanyl increased and propofol maintained for better sedation , very dyssynchronous to vent.  Switched from precedex to propofol 8/27 poorly responsive with tachycardia, HTN and vent dyssynchrony, tolerated PSV better  8/28 sedation stopped/ extubated, hypertensive started on cleviprex 8/29 remains stable postextubation, mentation improving and off Precedex.  Diuresing well 8/30 continues to remain significantly encephalopathic, withdraws to verbal stimuli but will not follow any commands 8/31 no acute issues overnight, remains encephalopathic but slightly more alert this a.m 9/1 no acute events overnight, mentation much improved this a.m. alert and oriented x 2 9/8- Stroke on MRI, Neuro consulted   LKW: 8/21   ROS: Full ROS was performed and is negative except as noted in the HPI.  Unable to obtain due to altered mental status.    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 known 60-70% mid-LAD stenosis with caths in 1999, 2002, 2006, and 2012 showing stable anatomy b. 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HCC)    Diabetes mellitus    Diverticulosis    Dizziness    chronic   Dyslipidemia    Fatty liver    GERD (gastroesophageal reflux disease)    hiatial hernia   Heart murmur    Hiatal hernia    Hypertension    Irritable bowel syndrome    Pancreatitis, chronic (HCC)    Peripheral vascular disease (HCC) 02/20/2010   s/p stent of left SFA; carotid stenosis   Seizure (HCC) 11/04/2018   last 1 yr 2020 per pt unsure month    Sleep apnea    no cpap    Stroke (HCC)    Thyroid nodule    Vitamin B12 deficiency    Family History  Problem Relation Age of Onset   Hypertension Mother    Osteoarthritis Mother    Pulmonary embolism Mother    Hypertension Father        aneurysm   Stroke Father    Colon cancer Paternal Grandfather    Stroke Other    Breast cancer Neg Hx    Colon polyps Neg Hx    Esophageal cancer Neg Hx    Rectal cancer Neg Hx    Stomach cancer Neg Hx  Pancreatic cancer Neg Hx    Liver disease Neg Hx     Social History:   reports that she quit smoking about 12 years ago. Her smoking use included cigarettes. She started smoking about 62 years ago. She has a 50 pack-year smoking history. She has never used smokeless tobacco. She reports that she does not drink alcohol and does not use drugs.  Medications  Current Facility-Administered Medications:    0.9 %  sodium chloride infusion, 250 mL, Intravenous, Continuous, Steffanie Dunn, DO, Stopped at 06/13/23 0636   acetaminophen (TYLENOL) tablet 650 mg, 650 mg, Oral, Q6H PRN, Gaetana Michaelis, MD, 650 mg at 06/16/23 2148   amLODipine (NORVASC) tablet  10 mg, 10 mg, Oral, Daily, Amin, Ankit C, MD, 10 mg at 06/22/23 0850   aspirin chewable tablet 81 mg, 81 mg, Oral, Daily, Amin, Ankit C, MD, 81 mg at 06/22/23 0850   atorvastatin (LIPITOR) tablet 40 mg, 40 mg, Oral, Daily, Amin, Ankit C, MD, 40 mg at 06/22/23 0850   Chlorhexidine Gluconate Cloth 2 % PADS 6 each, 6 each, Topical, Daily, Steffanie Dunn, DO, 6 each at 06/22/23 0851   clopidogrel (PLAVIX) tablet 75 mg, 75 mg, Oral, Daily, Amin, Ankit C, MD, 75 mg at 06/22/23 0850   dextrose 50 % solution 25 g, 25 g, Intravenous, STAT, Elgergawy, Leana Roe, MD   docusate (COLACE) 50 MG/5ML liquid 100 mg, 100 mg, Oral, BID PRN, Amin, Ankit C, MD   fiber supplement (BANATROL TF) liquid 60 mL, 60 mL, Oral, TID, Amin, Ankit C, MD, 60 mL at 06/22/23 0849   Gerhardt's butt cream, , Topical, QID, Oretha Milch, MD, Given at 06/22/23 0851   heparin injection 5,000 Units, 5,000 Units, Subcutaneous, Q8H, Elgergawy, Leana Roe, MD, 5,000 Units at 06/22/23 0554   hydrALAZINE (APRESOLINE) injection 10 mg, 10 mg, Intravenous, Q4H PRN, Janeann Forehand D, NP, 10 mg at 06/14/23 0433   hydrALAZINE (APRESOLINE) tablet 50 mg, 50 mg, Oral, Q8H, Amin, Ankit C, MD, 50 mg at 06/22/23 0555   ipratropium-albuterol (DUONEB) 0.5-2.5 (3) MG/3ML nebulizer solution 3 mL, 3 mL, Nebulization, Q4H PRN, Selmer Dominion B, NP   levETIRAcetam (KEPPRA) tablet 500 mg, 500 mg, Oral, BID, Amin, Ankit C, MD, 500 mg at 06/22/23 0850   lipase/protease/amylase (CREON) capsule 24,000 Units, 24,000 Units, Oral, TID AC, Amin, Ankit C, MD, 24,000 Units at 06/22/23 0850   loperamide (IMODIUM) capsule 4 mg, 4 mg, Oral, PRN, Amin, Ankit C, MD, 4 mg at 06/21/23 2130   melatonin tablet 3 mg, 3 mg, Oral, QHS, Amin, Ankit C, MD, 3 mg at 06/21/23 2132   metoprolol tartrate (LOPRESSOR) injection 5 mg, 5 mg, Intravenous, Q4H PRN, Amin, Ankit C, MD   metoprolol tartrate (LOPRESSOR) tablet 100 mg, 100 mg, Oral, BID, Amin, Ankit C, MD, 100 mg at 06/22/23 0850    ondansetron (ZOFRAN) injection 4 mg, 4 mg, Intravenous, Q6H PRN, Steffanie Dunn, DO   Oral care mouth rinse, 15 mL, Mouth Rinse, 4 times per day, Karie Fetch P, DO, 15 mL at 06/22/23 0851   Oral care mouth rinse, 15 mL, Mouth Rinse, PRN, Karie Fetch P, DO   pantoprazole (PROTONIX) EC tablet 40 mg, 40 mg, Oral, Daily, Pham, Minh Q, RPH-CPP, 40 mg at 06/22/23 0850   polyethylene glycol (MIRALAX / GLYCOLAX) packet 17 g, 17 g, Oral, Daily PRN, Amin, Ankit C, MD   senna-docusate (Senokot-S) tablet 1 tablet, 1 tablet, Oral, QHS PRN, Nelson Chimes, Ankit C, MD  traZODone (DESYREL) tablet 150 mg, 150 mg, Oral, QHS PRN, Paliwal, Aditya, MD, 150 mg at 06/20/23 2051   Vivonex RTF LIQD 1,000 mL, 1,000 mL, Per Tube, Q24H, Amin, Ankit C, MD, 1,000 mL at 06/21/23 1845  Medications Prior to Admission  Medication Sig Dispense Refill   albuterol (VENTOLIN HFA) 108 (90 Base) MCG/ACT inhaler Inhale 1-2 puffs into the lungs every 6 (six) hours as needed for wheezing or shortness of breath.     amLODipine (NORVASC) 10 MG tablet Take 1 tablet (10 mg total) by mouth daily. 30 tablet 0   aspirin 81 MG chewable tablet Chew 1 tablet (81 mg total) by mouth daily.     atorvastatin (LIPITOR) 40 MG tablet Take 1 tablet (40 mg total) by mouth daily. 30 tablet 0   cloNIDine (CATAPRES) 0.1 MG tablet Take 1 tablet (0.1 mg total) by mouth 2 (two) times daily. 30 tablet 0   clopidogrel (PLAVIX) 75 MG tablet Take 1 tablet (75 mg total) by mouth daily. (Patient taking differently: Take 75 mg by mouth every evening.) 30 tablet 0   esomeprazole (NEXIUM) 40 MG capsule Take 40 mg by mouth 2 (two) times daily before a meal.     fluticasone (FLONASE) 50 MCG/ACT nasal spray Place 2 sprays into both nostrils daily as needed for allergies.     furosemide (LASIX) 20 MG tablet Take 1 tablet (20 mg total) by mouth daily as needed. (Patient taking differently: Take 20 mg by mouth every other day.) 30 tablet 0   HYDROcodone-acetaminophen (NORCO) 10-325  MG tablet Take 1 tablet by mouth every 6 (six) hours as needed for moderate pain or severe pain.     isosorbide mononitrate (IMDUR) 60 MG 24 hr tablet TAKE 1 TABLET BY MOUTH EVERY DAY 90 tablet 0   levETIRAcetam (KEPPRA) 500 MG tablet TAKE 1 TABLET BY MOUTH TWICE A DAY (Patient taking differently: Take 500 mg by mouth 2 (two) times daily.) 180 tablet 2   meclizine (ANTIVERT) 25 MG tablet Take 0.5 tablets (12.5 mg total) by mouth 3 (three) times daily as needed for dizziness. 30 tablet 0   metoprolol succinate (TOPROL-XL) 100 MG 24 hr tablet Take 100 mg by mouth daily.     mirtazapine (REMERON) 15 MG tablet Take 15 mg by mouth at bedtime.     montelukast (SINGULAIR) 10 MG tablet Take 10 mg by mouth daily.     nitroGLYCERIN (NITROSTAT) 0.4 MG SL tablet Place 1 tablet (0.4 mg total) under the tongue every 5 (five) minutes as needed for chest pain. 90 tablet 3   ondansetron (ZOFRAN) 4 MG tablet Take 1 tablet (4 mg total) by mouth every 6 (six) hours as needed for nausea or vomiting. 40 tablet 1   Pancrelipase, Lip-Prot-Amyl, (ZENPEP) 40000-126000 units CPEP Take 2 capsules with meals and one capsule with snacks. (Patient taking differently: Take 2 capsules by mouth 2 (two) times daily.) 240 capsule 0   pantoprazole (PROTONIX) 40 MG tablet Take 1 tablet (40 mg total) by mouth 2 (two) times daily. 180 tablet 2   Polysaccharide-Iron Complex 150 MG CAPS Take 1 tablet by mouth daily.     venlafaxine XR (EFFEXOR-XR) 75 MG 24 hr capsule Take 1 capsule (75 mg total) by mouth daily.     vitamin B-12 (CYANOCOBALAMIN) 500 MCG tablet Take 500 mcg by mouth daily.     Vitamin D, Ergocalciferol, (DRISDOL) 1.25 MG (50000 UT) CAPS capsule Take 50,000 Units by mouth every Wednesday.  Exam: Current vital signs: BP (!) 125/49   Pulse 66   Temp 98.4 F (36.9 C) (Axillary)   Resp 13   Ht 5\' 3"  (1.6 m)   Wt 81.2 kg   SpO2 96%   BMI 31.71 kg/m  Vital signs in last 24 hours: Temp:  [97.2 F (36.2 C)-98.4 F  (36.9 C)] 98.4 F (36.9 C) (09/08 0400) Pulse Rate:  [64-69] 66 (09/08 0400) Resp:  [13-16] 13 (09/08 0400) BP: (99-125)/(41-55) 125/49 (09/08 0555) SpO2:  [93 %-96 %] 96 % (09/08 0400)  GENERAL: Drowsy, awakens to verbal stimuli HEENT: - Normocephalic and atraumatic, dry mm, no LN++, no Thyromegally LUNGS - Clear to auscultation bilaterally with no wheezes CV - S1S2 RRR, no m/r/g, equal pulses bilaterally. ABDOMEN - Soft, nontender, nondistended with normoactive BS Ext: bilateral hands with dependent edema  NEURO:  Mental Status: Drowsy, states name and that she is in the hospital. Does not tell me the date. Repeatedly asks to be left alone throughout the exam. Follows simple commands initially and then stops participating in exam Language: speech is clear.  Naming, repetition, fluency, and comprehension intact. Cranial Nerves: PERRL 3 mm/brisk. EOMI, visual fields full, right facial asymmetry, facial sensation intact, hearing intact, tongue/uvula/soft palate midline, normal sternocleidomastoid and trapezius muscle strength. No evidence of tongue atrophy or fasciculations Motor: Elevates both arms antigravity, wiggles toes. Does not resist gravity in bilateral lower extremities  Tone: is normal and bulk is normal Sensation- Intact to light touch bilaterally Coordination: Does not follow commands to compete Gait- deferred  NIHSS 1a Level of Conscious.: 1 1b LOC Questions: 1 1c LOC Commands: 0 2 Best Gaze: 0 3 Visual: 0 4 Facial Palsy: 1 5a Motor Arm - left: 0 5b Motor Arm - Right: 0 6a Motor Leg - Left: 2 6b Motor Leg - Right: 2 7 Limb Ataxia: 0 8 Sensory: 0 9 Best Language: 0 10 Dysarthria: 0 11 Extinct. and Inatten.: 0 TOTAL: 7   Labs I have reviewed labs in epic and the results pertinent to this consultation are:  CBC    Component Value Date/Time   WBC 7.4 06/22/2023 0807   RBC 2.75 (L) 06/22/2023 0807   HGB 8.2 (L) 06/22/2023 0807   HGB 12.1 02/06/2023 1345    HCT 25.8 (L) 06/22/2023 0807   HCT 37.9 02/06/2023 1345   PLT 289 06/22/2023 0807   PLT 251 02/06/2023 1345   MCV 93.8 06/22/2023 0807   MCV 94 02/06/2023 1345   MCH 29.8 06/22/2023 0807   MCHC 31.8 06/22/2023 0807   RDW 16.2 (H) 06/22/2023 0807   RDW 12.8 02/06/2023 1345   LYMPHSABS 2.3 06/14/2023 1501   MONOABS 1.1 (H) 06/14/2023 1501   EOSABS 0.3 06/14/2023 1501   BASOSABS 0.0 06/14/2023 1501    CMP     Component Value Date/Time   NA 139 06/21/2023 0859   NA 141 02/18/2023 1449   K 4.2 06/21/2023 0859   CL 112 (H) 06/21/2023 0859   CO2 19 (L) 06/21/2023 0859   GLUCOSE 147 (H) 06/21/2023 0859   BUN 53 (H) 06/21/2023 0859   BUN 16 02/18/2023 1449   CREATININE 1.59 (H) 06/21/2023 0859   CALCIUM 8.3 (L) 06/21/2023 0859   PROT 5.4 (L) 06/06/2023 0311   ALBUMIN 2.1 (L) 06/14/2023 0220   AST 25 06/06/2023 0311   ALT 17 06/06/2023 0311   ALKPHOS 75 06/06/2023 0311   BILITOT 0.7 06/06/2023 0311   GFRNONAA 33 (L) 06/21/2023 0454  GFRAA 30 (L) 10/21/2019 1904    Lipid Panel     Component Value Date/Time   CHOL 104 04/16/2023 0335   TRIG 104 06/12/2023 0316   HDL 30 (L) 04/16/2023 0335   CHOLHDL 3.5 04/16/2023 0335   VLDL 28 04/16/2023 0335   LDLCALC 46 04/16/2023 0335     Imaging I have reviewed the images obtained:  MRI examination of the brain - 1.8 cm focus of FLAIR hyperintensity involving the cortical to subcortical aspect of the left parietal lobe, most likely reflecting an evolving subacute ischemic infarct. Associated petechial blood products without frank hemorrhagic transformation or significant mass effect. Finding is new as compared to prior MRI from 01/02/2023.  Echo: EF 60-65% with grade 1 diastolic dysfunction  Assessment:  77 y.o. female initially admitted for shortness of breath. She was found to have a stroke on MRI and neurology has been consulted for a stroke work up. On exam she is drowsy, appears to have generalized weakness and a right facial  droop. She does additionally have some confusion. Exam is limited due to lack of participation and fatigue.  Impression: Acute ischemic infarct in left parietal lobe.  Recommendations: - HgbA1c, fasting lipid panel - The following imaging if indicated  - MRA of the brain without contrast and carotid duplex - Echocardiogram - Prophylactic therapy-Antiplatelets  ASA 81mg  and Plavix 75mg  daily  - Risk factor modification - Telemetry monitoring - PT consult, OT consult, Speech consult - Stroke team to follow    Hospital Day # 18  Patient seen and examined by NP/APP with MD. MD to update note as needed.   Elmer Picker, DNP, FNP-BC [facility] Pager: [phone]  ATTENDING ATTESTATION:  Seen and evaluated. Mild right facial droop, b/l leg weakness otherwise unremarkable exam. Stroke workup as above.  Stroke team to follow.  Dr. Viviann Spare evaluated pt independently, reviewed imaging, chart, labs. Discussed and formulated plan with the Resident/APP. Changes were made to the note where appropriate. Please see APP/resident note above for details.   Sheehan Stacey,MD

## 2023-06-22 NOTE — Plan of Care (Signed)

## 2023-06-22 NOTE — Plan of Care (Signed)
  Problem: Education: Goal: Knowledge of General Education information will improve Description: Including pain rating scale, medication(s)/side effects and non-pharmacologic comfort measures Outcome: Progressing   Problem: Health Behavior/Discharge Planning: Goal: Ability to manage health-related needs will improve Outcome: Progressing   Problem: Clinical Measurements: Goal: Ability to maintain clinical measurements within normal limits will improve Outcome: Progressing Goal: Will remain free from infection Outcome: Progressing   Problem: Activity: Goal: Risk for activity intolerance will decrease Outcome: Progressing   Problem: Nutrition: Goal: Adequate nutrition will be maintained Outcome: Progressing   Problem: Coping: Goal: Level of anxiety will decrease Outcome: Progressing   Problem: Elimination: Goal: Will not experience complications related to bowel motility Outcome: Progressing   Problem: Pain Managment: Goal: General experience of comfort will improve Outcome: Progressing

## 2023-06-22 NOTE — Progress Notes (Signed)
PROGRESS NOTE    [name]  WUJ:[id number] DOB: 1946/03/29 DOA: 06/04/2023 PCP: Garlan Fillers, MD   Chief Complaint  Patient presents with   Respiratory Distress    Brief Narrative:   77 year old with history of recurrent pneumonia, vitamin B12 deficiency, CVA, seizure, HTN, GERD, diabetes, DVT admitted for shortness of breath. Initially in the ER severely hypoxic requiring BiPAP but eventually had to be intubated on 8/21. Initially started on vancomycin, steroids IV Rocephin. Eventually completed 7 days of Rocephin, 5 days of steroids and she was extubated on 8/28. Postextubation developed encephalopathy which improved over the course of 3-4 days. Speech and swallow performed MBS, and D3 diet recommended.     Significant Hospital Events:   8/21 admitted, intubated 8/22 Remains on vent with some issues with agitation, added Precedex drip  8/24 agitated on trying to reduce sedation 8/25-agitation on reducing sedation 8/26 ABG with hypercapnia and hypercarbia, fentanyl increased and propofol maintained for better sedation , very dyssynchronous to vent.  Switched from precedex to propofol 8/27 poorly responsive with tachycardia, HTN and vent dyssynchrony, tolerated PSV better  8/28 sedation stopped/ extubated, hypertensive started on cleviprex 8/29 remains stable postextubation, mentation improving and off Precedex.  Diuresing well 8/30 continues to remain significantly encephalopathic, withdraws to verbal stimuli but will not follow any commands 8/31 no acute issues overnight, remains encephalopathic but slightly more alert this a.m 9/1 no acute events overnight, mentation much improved this a.m. alert and oriented x 2 9/2 MRI significant for subacute CVA   Assessment & Plan:   Principal Problem:   Acute respiratory failure with hypoxia (HCC)    Acute hypoxic respiratory failure, improved Sepsis secondary to community-acquired pneumonia History of OSA on  CPAP -Patient required intubation on 8/21, she was extubated 8/28 -  she remains on 2 L oxygen, remains confused, able to comply with incentive spirometry and flutter valve -Completed 7 days of antibiotics as of 8/27, cultures negative from 8/21 -She remains at risk of aspiration, with significant secretions, this appears to be improved today, she was encouraged with incentive spirometry, flutter valve, staff were instructed to do suctioning as needed -Completed antibiotic course   Acute metabolic encephalopathy - There was concerns of ICU delirium.  BUN was also elevated.  EEG, global cerebral dysfunction but no seizures.  -Mentation continues to fluctuate, but overall it is improving, ABG has been obtained y, as she was more sleepy, pCO2 of 34, no evidence of retention. -Patient remains significantly confused few days post her extubation, far from her baseline as discussed with son, will proceed with MRI brain given known significant atherosclerotic disease to rule out any CVA. -Remains on nocturnal tube feed, but she is with very poor oral intake despite encouragement, I do anticipate it will take her a long time to recover with new diagnosis of CVA, so she will benefit from PEG tube, IR consulted.  Subacute CVA -MRI brain confirms subacute CVA, she is with known significant carotid artery disease, with total occlusion of left ICA, neurology consulted.  Uremia -BUN peaked at 98, trending down, it is down to 53 today.   Dysphagia - Had aspiration on recent esophagram with multiple pneumonias over the past year.  Seen by speech and swallow, moderate aspiration risk.  Status post MBS.  D3 diet.  P.o. intake not adequate, continue NG tube, transition to nocturnal feeding to allow daytime p.o. intake -She is advanced to dysphagia 3, but oral intake remains poor, continue to monitor nocturnal tube feed. -  Patient with hypoglycemia, did DC insulin sliding scale and keep monitoring CBG    Diarrhea -Suspect from tube feeds.  C. difficile negative.   Hypernatremia - Resolved   Acute kidney injury on CKD stage IV -Baseline creatinine 1.5.  Admission creatinine 2.17.  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  Echocardiogram shows preserved EF with grade 1 DD   GERD -PPI   Depression -Currently p.o. medicines on hold until mentation remains consistently stable     DVT prophylaxis: Subcu heparin on hold since 8/24 due to significant bruising, and oozing at injection site, this appears to be improving, she is back on subcu heparin Code Status: Full code Family Communication: discussed with son by phone 9/7, 9/8 Disposition:   Status is: Inpatient    Consultants:  PCCM   Subjective: Remains with very poor oral intake,  Objective: Vitals:   06/22/23 0400 06/22/23 0555 06/22/23 0700 06/22/23 0800  BP: (!) 122/50 (!) 125/49    Pulse: 66     Resp: 13     Temp: 98.4 F (36.9 C)   98.2 F (36.8 C)  TempSrc: Axillary   Oral  SpO2: 96%     Weight:   81.2 kg   Height:        Intake/Output Summary (Last 24 hours) at 06/22/2023 1222 Last data filed at 06/22/2023 1041 Gross per 24 hour  Intake 120 ml  Output 0 ml  Net 120 ml   Filed Weights   06/19/23 0500 06/20/23 0500 06/22/23 0700  Weight: 81 kg 81.2 kg 81.2 kg    Examination:  Awake Alert, he is more coherent and conversant today, but remains confused, frail, chronically ill-appearing Symmetrical Chest wall movement, Good air movement bilaterally, CTAB RRR,No Gallops,Rubs or new Murmurs, No Parasternal Heave +ve B.Sounds, Abd Soft, No tenderness, No rebound - guarding or rigidity. No Cyanosis, Clubbing or edema, No new Rash or bruise        Data Reviewed: I have personally reviewed following labs and  imaging studies  CBC: Recent Labs  Lab 06/18/23 0641 06/19/23 0300 06/20/23 0312 06/21/23 0859 06/22/23 0807  WBC 13.5* 10.7* 10.7* 8.1 7.4  HGB 8.6* 8.4* 7.9* 7.7* 8.2*  HCT 27.3* 26.7* 25.4* 24.4* 25.8*  MCV 92.2 94.0 94.8 97.6 93.8  PLT 261 272 270 263 289    Basic Metabolic Panel: Recent Labs  Lab 06/17/23 0230 06/18/23 0641 06/19/23 0300 06/20/23 0312 06/21/23 0859 06/22/23 0807  NA 141 141 141 138 139 137  K 4.1 4.0 4.1 4.3 4.2 4.3  CL 112* 110 113* 109 112* 109  CO2 20* 20* 19* 18* 19* 20*  GLUCOSE 188* 109* 174* 150* 147* 122*  BUN 73* 68* 61* 58* 53* 50*  CREATININE 1.31* 1.38* 1.45* 1.52* 1.59* 1.57*  CALCIUM 8.6* 8.9 8.8* 8.6* 8.3* 8.5*  MG 1.9 1.9 2.0 1.9 2.0 1.9  PHOS 3.8  --   --   --   --   --     GFR: Estimated Creatinine Clearance: 30.8 mL/min (A) (by C-G formula based on SCr of 1.57 mg/dL (H)).  Liver Function Tests: No results for input(s): "AST", "ALT", "ALKPHOS", "BILITOT", "PROT", "ALBUMIN" in the last 168 hours.   CBG: Recent Labs  Lab 06/21/23 1506 06/21/23 2046 06/22/23 0127 06/22/23 0311 06/22/23 0817  GLUCAP  100* 188* 193* 190* 121*     Recent Results (from the past 240 hour(s))  C Difficile Quick Screen (NO PCR Reflex)     Status: None   Collection Time: 06/17/23  4:52 AM   Specimen: STOOL  Result Value Ref Range Status   C Diff antigen NEGATIVE NEGATIVE Final   C Diff toxin NEGATIVE NEGATIVE Final   C Diff interpretation No C. difficile detected.  Final    Comment: Performed at [facility], [address], [place], [place] [address]         Radiology Studies: MR BRAIN WO CONTRAST  Result Date: 06/21/2023 CLINICAL DATA:  Initial evaluation for delirium, altered mental status. EXAM: MRI HEAD WITHOUT CONTRAST TECHNIQUE: Multiplanar, multiecho pulse sequences of the brain and surrounding structures were obtained without intravenous contrast. COMPARISON:  Prior CT from 04/15/2023. FINDINGS: Brain: Examination  technically limited by motion artifact. Additionally, the axial DWI sequence is severely degraded and essentially nondiagnostic. Diffuse prominence of the CSF containing spaces compatible with generalized cerebral atrophy. Patchy T2/FLAIR hyperintensity involving the periventricular deep white matter both cerebral hemispheres as well as the pons, consistent with chronic small vessel ischemic disease, moderately advanced. There is a new 1.8 cm focus of FLAIR hyperintensity involving the cortical to subcortical aspect of the left parietal lobe (series 6, image 23). Associated susceptibility artifact at this location (series 7, image 63), consistent with petechial blood products. Probable faint diffusion signal abnormality (series 3, image 6). Finding most likely reflects an evolving subacute ischemic infarct. Although this is new as compared to prior MRI from 01/02/2023, this is not acute in appearance on today's exam. No other visible acute or subacute ischemic changes. Gray-white matter differentiation otherwise maintained. No other areas of chronic cortical infarction. No other significant acute or chronic intracranial blood products. No mass lesion, midline shift or mass effect. No hydrocephalus or extra-axial fluid collection. Partially empty sella noted. Suprasellar region normal. Vascular: Major intracranial vascular flow voids are grossly maintained at the skull base. Skull and upper cervical spine: Craniocervical junction normal. Bone marrow signal intensity heterogeneous but overall within normal limits. No scalp soft tissue abnormality. Sinuses/Orbits: Prior bilateral ocular lens replacement. Scattered mucosal thickening present throughout the paranasal sinuses. Small bilateral mastoid effusions noted. Patient appears to be intubated. Other: None. IMPRESSION: 1. Technically limited motion degraded exam. 2. 1.8 cm focus of FLAIR hyperintensity involving the cortical to subcortical aspect of the left parietal  lobe, most likely reflecting an evolving subacute ischemic infarct. Associated petechial blood products without frank hemorrhagic transformation or significant mass effect. Finding is new as compared to prior MRI from 01/02/2023. 3. No other acute intracranial abnormality. 4. Underlying age-related cerebral atrophy with moderately advanced chronic microvascular ischemic disease. Electronically Signed   By: Rise Mu M.D.   On: 06/21/2023 20:49        Scheduled Meds:  amLODipine  10 mg Oral Daily   aspirin  81 mg Oral Daily   atorvastatin  40 mg Oral Daily   Chlorhexidine Gluconate Cloth  6 each Topical Daily   clopidogrel  75 mg Oral Daily   fiber supplement (BANATROL TF)  60 mL Oral TID   Gerhardt's butt cream   Topical QID   heparin injection (subcutaneous)  5,000 Units Subcutaneous Q8H   hydrALAZINE  50 mg Oral Q8H   levETIRAcetam  500 mg Oral BID   lipase/protease/amylase  24,000 Units Oral TID AC   melatonin  3 mg Oral QHS   metoprolol tartrate  100 mg Oral BID   mouth rinse  15 mL Mouth Rinse 4 times per day   pantoprazole  40 mg Oral Daily   Vivonex RTF  1,000 mL Per Tube Q24H   Continuous Infusions:  sodium chloride Stopped (06/13/23 0636)     LOS: 18 days        Huey Bienenstock, MD [facility]   To contact the attending provider between 7A-7P or the covering provider during after hours 7P-7A, please log into the web site [email or web address] and access using universal [place] password for that web site. If you do not have the password, please call the hospital operator.  06/22/2023, 12:22 PM

## 2023-06-23 ENCOUNTER — Inpatient Hospital Stay (HOSPITAL_COMMUNITY): Payer: Medicare HMO

## 2023-06-23 DIAGNOSIS — R569 Unspecified convulsions: Secondary | ICD-10-CM

## 2023-06-23 DIAGNOSIS — J9601 Acute respiratory failure with hypoxia: Secondary | ICD-10-CM | POA: Diagnosis not present

## 2023-06-23 DIAGNOSIS — R410 Disorientation, unspecified: Secondary | ICD-10-CM | POA: Diagnosis not present

## 2023-06-23 DIAGNOSIS — R0989 Other specified symptoms and signs involving the circulatory and respiratory systems: Secondary | ICD-10-CM

## 2023-06-23 DIAGNOSIS — D649 Anemia, unspecified: Secondary | ICD-10-CM | POA: Diagnosis not present

## 2023-06-23 DIAGNOSIS — I639 Cerebral infarction, unspecified: Secondary | ICD-10-CM | POA: Diagnosis not present

## 2023-06-23 LAB — LIPID PANEL
Cholesterol: 72 mg/dL (ref 0–200)
HDL: 32 mg/dL — ABNORMAL LOW (ref >40)
LDL Cholesterol: 27 mg/dL (ref 0–99)
Total CHOL/HDL Ratio: 2.3 ratio
Triglycerides: 64 mg/dL (ref <150)
VLDL: 13 mg/dL (ref 0–40)

## 2023-06-23 LAB — CBC
HCT: 23.7 % — ABNORMAL LOW (ref 36.0–46.0)
Hemoglobin: 7.5 g/dL — ABNORMAL LOW (ref 12.0–15.0)
MCH: 30.6 pg (ref 26.0–34.0)
MCHC: 31.6 g/dL (ref 30.0–36.0)
MCV: 96.7 fL (ref 80.0–100.0)
Platelets: 257 10*3/uL (ref 150–400)
RBC: 2.45 MIL/uL — ABNORMAL LOW (ref 3.87–5.11)
RDW: 16.1 % — ABNORMAL HIGH (ref 11.5–15.5)
WBC: 6.1 10*3/uL (ref 4.0–10.5)
nRBC: 0 % (ref 0.0–0.2)

## 2023-06-23 LAB — GLUCOSE, CAPILLARY
Glucose-Capillary: 103 mg/dL — ABNORMAL HIGH (ref 70–99)
Glucose-Capillary: 103 mg/dL — ABNORMAL HIGH (ref 70–99)
Glucose-Capillary: 147 mg/dL — ABNORMAL HIGH (ref 70–99)
Glucose-Capillary: 160 mg/dL — ABNORMAL HIGH (ref 70–99)
Glucose-Capillary: 163 mg/dL — ABNORMAL HIGH (ref 70–99)
Glucose-Capillary: 164 mg/dL — ABNORMAL HIGH (ref 70–99)
Glucose-Capillary: 168 mg/dL — ABNORMAL HIGH (ref 70–99)

## 2023-06-23 LAB — HEMOGLOBIN A1C
Hgb A1c MFr Bld: 5.9 % — ABNORMAL HIGH (ref 4.8–5.6)
Mean Plasma Glucose: 122.63 mg/dL

## 2023-06-23 LAB — BASIC METABOLIC PANEL
Anion gap: 7 (ref 5–15)
BUN: 45 mg/dL — ABNORMAL HIGH (ref 8–23)
CO2: 21 mmol/L — ABNORMAL LOW (ref 22–32)
Calcium: 8.5 mg/dL — ABNORMAL LOW (ref 8.9–10.3)
Chloride: 108 mmol/L (ref 98–111)
Creatinine, Ser: 1.48 mg/dL — ABNORMAL HIGH (ref 0.44–1.00)
GFR, Estimated: 36 mL/min — ABNORMAL LOW (ref >60)
Glucose, Bld: 186 mg/dL — ABNORMAL HIGH (ref 70–99)
Potassium: 4.1 mmol/L (ref 3.5–5.1)
Sodium: 136 mmol/L (ref 135–145)

## 2023-06-23 LAB — MAGNESIUM: Magnesium: 1.8 mg/dL (ref 1.7–2.4)

## 2023-06-23 MED ORDER — TICAGRELOR 90 MG PO TABS
90.0000 mg | ORAL_TABLET | Freq: Two times a day (BID) | ORAL | Status: DC
  Administered 2023-06-24 – 2023-06-30 (×13): 90 mg via ORAL
  Filled 2023-06-23 (×14): qty 1

## 2023-06-23 NOTE — Progress Notes (Signed)
PROGRESS NOTE    [name]  YIR:[id number] DOB: 01-02-1946 DOA: 06/04/2023 PCP: Garlan Fillers, MD   Chief Complaint  Patient presents with   Respiratory Distress    Brief Narrative:   77 year old with history of recurrent pneumonia, vitamin B12 deficiency, CVA, seizure, HTN, GERD, diabetes, DVT admitted for shortness of breath. Initially in the ER severely hypoxic requiring BiPAP but eventually had to be intubated on 8/21. Initially started on vancomycin, steroids IV Rocephin. Eventually completed 7 days of Rocephin, 5 days of steroids and she was extubated on 8/28. Postextubation developed encephalopathy which improved over the course of 3-4 days. Speech and swallow performed MBS, and D3 diet recommended.     Significant Hospital Events:   8/21 admitted, intubated 8/22 Remains on vent with some issues with agitation, added Precedex drip  8/24 agitated on trying to reduce sedation 8/25-agitation on reducing sedation 8/26 ABG with hypercapnia and hypercarbia, fentanyl increased and propofol maintained for better sedation , very dyssynchronous to vent.  Switched from precedex to propofol 8/27 poorly responsive with tachycardia, HTN and vent dyssynchrony, tolerated PSV better  8/28 sedation stopped/ extubated, hypertensive started on cleviprex 8/29 remains stable postextubation, mentation improving and off Precedex.  Diuresing well 8/30 continues to remain significantly encephalopathic, withdraws to verbal stimuli but will not follow any commands 8/31 no acute issues overnight, remains encephalopathic but slightly more alert this a.m 9/1 no acute events overnight, mentation much improved this a.m. alert and oriented x 2 9/7 MRI significant for subacute CVA   Assessment & Plan:   Principal Problem:   Acute respiratory failure with hypoxia (HCC)    Acute hypoxic respiratory failure, improved Sepsis secondary to community-acquired pneumonia History of OSA on  CPAP -Patient required intubation on 8/21, she was extubated 8/28 -  she remains on 2 L oxygen, remains confused, difficult to comply with incentive spirometry and flutter valve -Completed 7 days of antibiotics as of 8/27, cultures negative from 8/21 -She remains at risk of aspiration. -Completed antibiotic course   Acute metabolic encephalopathy Failure to thrive - There was concerns of ICU delirium.  BUN was also elevated.   -EEG, global cerebral dysfunction but no seizures.  - ABG has been obtained , as she was more sleepy, pCO2 of 34, no evidence of retention. -MRI significant for acute CVA -Remains with very poor oral intake, have considered PEG tube as discussed with son, but unfortunately and amendable by IR secondary to large ventral hernia, and she is aspirin and Brilinta, which make it difficult for surgical PEG, but mentation has been improving over last couple days, appetite has been improving as well, so we will continue with current measures.  Subacute CVA Internal carotid artery disease -MRI brain confirms subacute CVA, she is with known significant left carotid artery disease. -Neurology input greatly appreciated. -2D echo obtained 8/30 with no evidence of embolic source -Follow on repeat carotid ultrasound -Follow on PT EEG -On dual antiplatelet therapy for recent stent May 2024, as discussed with neurology, will keep on aspirin and Brilinta x 30 days, then transition back to aspirin and Plavix -Allow for permissive hypertension and avoid low blood pressure readings, target systolic blood pressure 140 or above  Uremia -BUN peaked at 98, trending down.   Dysphagia - Had aspiration on recent esophagram with multiple pneumonias over the past year.  Seen by speech and swallow, moderate aspiration risk.  Status post MBS.  D3 diet.  P.o. intake not adequate, continue NG tube, transition to nocturnal  feeding to allow daytime p.o. intake -She is advanced to dysphagia 3, but  oral intake remains poor, continue to monitor nocturnal tube feed. -Patient with hypoglycemia, did DC insulin sliding scale and keep monitoring CBG   Diarrhea -Suspect from tube feeds.  C. difficile negative.   Hypernatremia - Resolved   Acute kidney injury on CKD stage IV -Baseline creatinine 1.5.  Admission creatinine 2.17.  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  Echocardiogram shows preserved EF with grade 1 DD -I have discontinued her hydralazine to avoid low blood pressure reading, and if remains on the lower side I will DC her amlodipine as well   GERD -PPI   Depression -Currently p.o. medicines on hold until mentation remains consistently stable     DVT prophylaxis: Subcu heparin  Code Status: Full code Family Communication: discussed with son by phone 9/7, 9/8, 9/9 Disposition:   Status is: Inpatient    Consultants:  PCCM pulmonary   Subjective:  Patient appears to be more awake and appropriate today, she denies any complaints, as discussed with staff she finished 25% of breakfast, 30% of lunch  Objective: Vitals:   06/23/23 0300 06/23/23 0403 06/23/23 0549 06/23/23 0830  BP: (!) 130/39 (!) 119/42 (!) 148/55 (!) 131/48  Pulse:    63  Resp:    20  Temp: (!) 97.5 F (36.4 C)   98 F (36.7 C)  TempSrc: Oral   Oral  SpO2:    98%  Weight:      Height:        Intake/Output Summary (Last 24 hours) at 06/23/2023 1413 Last data filed at 06/23/2023 0918 Gross per 24 hour  Intake 240 ml  Output 1450 ml  Net -1210 ml   Filed Weights   06/19/23 0500 06/20/23 0500 06/22/23 0700  Weight: 81 kg 81.2 kg 81.2 kg    Examination:  Mentation status appears much improved, she is oriented x 3, conversant, chronically ill-appearing, frail Symmetrical Chest wall  movement, Good air movement bilaterally, CTAB RRR,No Gallops,Rubs or new Murmurs, No Parasternal Heave +ve B.Sounds, Abd Soft, No tenderness, No rebound - guarding or rigidity. No Cyanosis, Clubbing or edema, she has significant bruising       Data Reviewed: I have personally reviewed following labs and imaging studies  CBC: Recent Labs  Lab 06/19/23 0300 06/20/23 0312 06/21/23 0859 06/22/23 0807 06/23/23 0557  WBC 10.7* 10.7* 8.1 7.4 6.1  HGB 8.4* 7.9* 7.7* 8.2* 7.5*  HCT 26.7* 25.4* 24.4* 25.8* 23.7*  MCV 94.0 94.8 97.6 93.8 96.7  PLT 272 270 263 289 257    Basic Metabolic Panel: Recent Labs  Lab 06/17/23 0230 06/18/23 0641 06/19/23 0300 06/20/23 0312 06/21/23 0859 06/22/23 0807 06/23/23 0557  NA 141   < > 141 138 139 137 136  K 4.1   < > 4.1 4.3 4.2 4.3 4.1  CL 112*   < > 113* 109 112* 109 108  CO2 20*   < > 19* 18* 19* 20* 21*  GLUCOSE 188*   < > 174* 150* 147* 122* 186*  BUN 73*   < > 61* 58* 53* 50* 45*  CREATININE 1.31*   < > 1.45* 1.52* 1.59* 1.57* 1.48*  CALCIUM 8.6*   < > 8.8* 8.6* 8.3* 8.5* 8.5*  MG 1.9   < >  2.0 1.9 2.0 1.9 1.8  PHOS 3.8  --   --   --   --   --   --    < > = values in this interval not displayed.    GFR: Estimated Creatinine Clearance: 32.6 mL/min (A) (by C-G formula based on SCr of 1.48 mg/dL (H)).  Liver Function Tests: No results for input(s): "AST", "ALT", "ALKPHOS", "BILITOT", "PROT", "ALBUMIN" in the last 168 hours.   CBG: Recent Labs  Lab 06/22/23 2034 06/23/23 0041 06/23/23 0352 06/23/23 0828 06/23/23 1222  GLUCAP 175* 163* 164* 103* 103*     Recent Results (from the past 240 hour(s))  C Difficile Quick Screen (NO PCR Reflex)     Status: None   Collection Time: 06/17/23  4:52 AM   Specimen: STOOL  Result Value Ref Range Status   C Diff antigen NEGATIVE NEGATIVE Final   C Diff toxin NEGATIVE NEGATIVE Final   C Diff interpretation No C. difficile detected.  Final    Comment: Performed at [facility], [address] [place], [place] [address]         Radiology Studies: VAS US CAROTID  Result Date: 06/23/2023 Carotid Arterial Duplex Study Patient Name:  NNEOMA BERTELSEN  Date of Exam:   06/23/2023 Medical Rec #: [id number]       Accession #:    [id number] Date of Birth: [birth year]       Patient Gender: F Patient Age:   71 years Exam Location:  [facility] Procedure:      VAS US CAROTID Referring Phys: Elmer Picker --------------------------------------------------------------------------------  Indications:       Carotid artery disease and Right stent. Risk Factors:      Hypertension, Diabetes, coronary artery disease, prior CVA,                    PAD. Limitations        Today's exam was limited due to heavy calcification and the                    resulting shadowing and the patient's inability or                    unwillingness to cooperate. Comparison Study:  Significant changes seen since previous exams 01/03/23;                    12/26/21 Performing Technologist: Shona Simpson  Examination Guidelines: A complete evaluation includes B-mode imaging, spectral Doppler, color Doppler, and power Doppler as needed of all accessible portions of each vessel. Bilateral testing is considered an integral part of a complete examination. Limited examinations for reoccurring indications may be performed as noted.  Right Carotid Findings: +----------+--------+--------+--------+------------------+--------+           PSV cm/sEDV cm/sStenosisPlaque DescriptionComments +----------+--------+--------+--------+------------------+--------+ CCA Prox  160     23                                         +----------+--------+--------+--------+------------------+--------+ CCA Distal119     16              heterogenous               +----------+--------+--------+--------+------------------+--------+ ICA Prox  107     21      1-39%   heterogenous                +----------+--------+--------+--------+------------------+--------+  ICA Mid   117     21                                         +----------+--------+--------+--------+------------------+--------+ ICA Distal113     27                                         +----------+--------+--------+--------+------------------+--------+ ECA       420     30      >50%    heterogenous               +----------+--------+--------+--------+------------------+--------+ +----------+--------+-------+--------+-------------------+           PSV cm/sEDV cmsDescribeArm Pressure (mmHG) +----------+--------+-------+--------+-------------------+ Subclavian210     0                                  +----------+--------+-------+--------+-------------------+ +---------+--------+--+--------+--+ VertebralPSV cm/s70EDV cm/s19 +---------+--------+--+--------+--+  Right Stent(s): +----------------+--------+--------+-------------+----------+--------+ Prox CCA-Mid ICAPSV cm/sEDV cm/sStenosis     Waveform  Comments +----------------+--------+--------+-------------+----------+--------+ Prox to Stent   161     24      <50% stenosismonophasic         +----------------+--------+--------+-------------+----------+--------+ Proximal Stent  141     25      <50% stenosismonophasic         +----------------+--------+--------+-------------+----------+--------+ Mid Stent       119     16      <50% stenosismonophasic         +----------------+--------+--------+-------------+----------+--------+ Distal Stent    117     21      <50% stenosismonophasic         +----------------+--------+--------+-------------+----------+--------+ Distal to Stent 113     27                   monophasic         +----------------+--------+--------+-------------+----------+--------+   Left Carotid Findings: +----------+--------+--------+--------+------------------+--------+           PSV cm/sEDV  cm/sStenosisPlaque DescriptionComments +----------+--------+--------+--------+------------------+--------+ CCA Prox  37      9               calcific                   +----------+--------+--------+--------+------------------+--------+ CCA Mid   104     10      >50%    calcific                   +----------+--------+--------+--------+------------------+--------+ CCA Distal74      6               calcific                   +----------+--------+--------+--------+------------------+--------+ ICA Prox  23      5       1-39%   calcific                   +----------+--------+--------+--------+------------------+--------+ ICA Mid   30      7                                          +----------+--------+--------+--------+------------------+--------+  ICA Distal64      7                                          +----------+--------+--------+--------+------------------+--------+ ECA       114     13              heterogenous               +----------+--------+--------+--------+------------------+--------+ +----------+--------+--------+--------+-------------------+           PSV cm/sEDV cm/sDescribeArm Pressure (mmHG) +----------+--------+--------+--------+-------------------+ [id number]     0                                   +----------+--------+--------+--------+-------------------+ +---------+--------+--+--------+--+ VertebralPSV cm/s98EDV cm/s20 +---------+--------+--+--------+--+   Summary: Right Carotid: Velocities in the right ICA are consistent with a 1-39% stenosis.                Non-hemodynamically significant plaque <50% noted in the CCA. The                ECA appears >50% stenosed. Patent stent. Left Carotid: Velocities in the left ICA are consistent with a 1-39% stenosis.               Hemodynamically significant plaque >50% visualized in the CCA. Vertebrals:  Bilateral vertebral arteries demonstrate antegrade flow. Subclavians: Normal  flow hemodynamics were seen in bilateral subclavian              arteries. *See table(s) above for measurements and observations.     Preliminary    MR ANGIO HEAD WO CONTRAST  Result Date: 06/22/2023 CLINICAL DATA:  Stroke follow-up EXAM: MRA HEAD WITHOUT CONTRAST TECHNIQUE: Angiographic images of the Circle of Willis were acquired using MRA technique without intravenous contrast. COMPARISON:  04/16/2022 FINDINGS: POSTERIOR CIRCULATION: --Vertebral arteries: Normal --Inferior cerebellar arteries: Normal. --Basilar artery: Normal. --Superior cerebellar arteries: Normal. --Posterior cerebral arteries: Normal.  Left P-comm is patent. ANTERIOR CIRCULATION: --Intracranial internal carotid arteries: Chronic occlusion of the left ICA. Normal right ICA. --Anterior cerebral arteries (ACA): Normal. --Middle cerebral arteries (MCA): Normal. IMPRESSION: Chronic occlusion of the left ICA.  No acute finding. Electronically Signed   By: Deatra Robinson M.D.   On: 06/22/2023 22:18   MR BRAIN WO CONTRAST  Result Date: 06/21/2023 CLINICAL DATA:  Initial evaluation for delirium, altered mental status. EXAM: MRI HEAD WITHOUT CONTRAST TECHNIQUE: Multiplanar, multiecho pulse sequences of the brain and surrounding structures were obtained without intravenous contrast. COMPARISON:  Prior CT from 04/15/2023. FINDINGS: Brain: Examination technically limited by motion artifact. Additionally, the axial DWI sequence is severely degraded and essentially nondiagnostic. Diffuse prominence of the CSF containing spaces compatible with generalized cerebral atrophy. Patchy T2/FLAIR hyperintensity involving the periventricular deep white matter both cerebral hemispheres as well as the pons, consistent with chronic small vessel ischemic disease, moderately advanced. There is a new 1.8 cm focus of FLAIR hyperintensity involving the cortical to subcortical aspect of the left parietal lobe (series 6, image 23). Associated susceptibility artifact at  this location (series 7, image 63), consistent with petechial blood products. Probable faint diffusion signal abnormality (series 3, image 6). Finding most likely reflects an evolving subacute ischemic infarct. Although this is new as compared to prior MRI from 01/02/2023, this is not acute in appearance on today's exam. No other visible acute or  subacute ischemic changes. Gray-white matter differentiation otherwise maintained. No other areas of chronic cortical infarction. No other significant acute or chronic intracranial blood products. No mass lesion, midline shift or mass effect. No hydrocephalus or extra-axial fluid collection. Partially empty sella noted. Suprasellar region normal. Vascular: Major intracranial vascular flow voids are grossly maintained at the skull base. Skull and upper cervical spine: Craniocervical junction normal. Bone marrow signal intensity heterogeneous but overall within normal limits. No scalp soft tissue abnormality. Sinuses/Orbits: Prior bilateral ocular lens replacement. Scattered mucosal thickening present throughout the paranasal sinuses. Small bilateral mastoid effusions noted. Patient appears to be intubated. Other: None. IMPRESSION: 1. Technically limited motion degraded exam. 2. 1.8 cm focus of FLAIR hyperintensity involving the cortical to subcortical aspect of the left parietal lobe, most likely reflecting an evolving subacute ischemic infarct. Associated petechial blood products without frank hemorrhagic transformation or significant mass effect. Finding is new as compared to prior MRI from 01/02/2023. 3. No other acute intracranial abnormality. 4. Underlying age-related cerebral atrophy with moderately advanced chronic microvascular ischemic disease. Electronically Signed   By: Rise Mu M.D.   On: 06/21/2023 20:49        Scheduled Meds:  amLODipine  10 mg Oral Daily   aspirin  81 mg Oral Daily   atorvastatin  40 mg Oral Daily   Chlorhexidine  Gluconate Cloth  6 each Topical Daily   fiber supplement (BANATROL TF)  60 mL Oral TID   Gerhardt's butt cream   Topical QID   heparin injection (subcutaneous)  5,000 Units Subcutaneous Q8H   levETIRAcetam  500 mg Oral BID   lipase/protease/amylase  24,000 Units Oral TID AC   melatonin  3 mg Oral QHS   metoprolol tartrate  100 mg Oral BID   mouth rinse  15 mL Mouth Rinse 4 times per day   pantoprazole  40 mg Oral Daily   [START ON 06/24/2023] ticagrelor  90 mg Oral BID   Vivonex RTF  1,000 mL Per Tube Q24H   Continuous Infusions:  sodium chloride Stopped (06/13/23 0636)     LOS: 19 days        Huey Bienenstock, MD [facility]   To contact the attending provider between 7A-7P or the covering provider during after hours 7P-7A, please log into the web site [email or web address] and access using universal Webb password for that web site. If you do not have the password, please call the hospital operator.  06/23/2023, 2:13 PM

## 2023-06-23 NOTE — Plan of Care (Signed)
IR was requested for image guided G tube placement.   Case was reviewed by Dr. Archer Asa, patient is not a candidate for percutaneous G tube placemen due to large ventral hernia. Recommends surgery/ GI consult.    Ordering provider notified.   Will delete the G tube placement order.  Please call IR for questions and concerns.   Ellanie Oppedisano Rexene Edison Nori Winegar PA-C 06/23/2023 10:02 AM

## 2023-06-23 NOTE — Progress Notes (Addendum)
STROKE TEAM PROGRESS NOTE   BRIEF HPI [name] is a 77 y.o. female with history of recurrent pneumonia, CVA, seizure, hypertension, diabetes, significant left carotid artery stenosis, DVT not on home antiplatelet therapy presented to [facility] ED severely hypoxic found to have pneumonia.  This led to an extended ICU stay requiring intubation from 8/21 to 8/28.  Patient has had significant agitation over course of stay, likely ICU delirium, requiring chemical restraint.  Floor team sought further imaging as patient remained significantly confused after extubation, off of baseline to rule out CVA.  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  On 9/9, carotid duplex showed hemodynamically significant plaque greater than 50% in left common carotid artery.  Patent stent in right PCA patent.   SIGNIFICANT HOSPITAL EVENTS Per IM note:  8/21 admitted, intubated 8/22 Remains on vent with some issues with agitation, added Precedex drip  8/24 agitated on trying to reduce sedation 8/25-agitation on reducing sedation 8/26 ABG with hypercapnia and hypercarbia, fentanyl increased and propofol maintained for better sedation , very dyssynchronous to vent.  Switched from precedex to propofol 8/27 poorly responsive with tachycardia, HTN and vent dyssynchrony, tolerated PSV better  8/28 sedation stopped/ extubated, hypertensive started on cleviprex 8/29 remains stable postextubation, mentation improving and off Precedex.  Diuresing well 8/30 continues to remain significantly encephalopathic, withdraws to verbal stimuli but will not follow any commands 8/31 no acute issues overnight, remains encephalopathic but slightly more alert this a.m 9/1 no acute events overnight, mentation much improved this a.m. alert and oriented x  2 9/7 MRI significant for subacute CVA  INTERIM HISTORY/SUBJECTIVE  On interview, patient has some trouble expressing history but is alert, pleasant and attentive to interview.  Oriented to situation, year.  Denies stroke symptoms.  Denies focal deficits.  Was unable to express how previous CVA impacted her life.  +/- Right   eye visual field defect at baseline.  Patient endorses having chronic left carotid occlusion for some time.  Neuro exam significant for bilateral lower extremity weakness 2+/5, not unexpected in a patient who has been in the ICU for over a week.  OBJECTIVE  CBC    Component Value Date/Time   WBC 6.1 06/23/2023 0557   RBC 2.45 (L) 06/23/2023 0557   HGB 7.5 (L) 06/23/2023 0557   HGB 12.1 02/06/2023 1345   HCT 23.7 (L) 06/23/2023 0557   HCT 37.9 02/06/2023 1345   PLT 257 06/23/2023 0557   PLT 251 02/06/2023 1345   MCV 96.7 06/23/2023 0557   MCV 94 02/06/2023 1345   MCH 30.6 06/23/2023 0557   MCHC 31.6 06/23/2023 0557   RDW 16.1 (H) 06/23/2023 0557   RDW 12.8 02/06/2023 1345   LYMPHSABS 2.3 06/14/2023 1501   MONOABS 1.1 (H) 06/14/2023 1501   EOSABS 0.3 06/14/2023 1501   BASOSABS 0.0 06/14/2023 1501    BMET    Component Value Date/Time   NA 136 06/23/2023 0557   NA 141 02/18/2023 1449   K 4.1 06/23/2023 0557   CL 108 06/23/2023 0557   CO2 21 (L) 06/23/2023 0557   GLUCOSE 186 (H) 06/23/2023 0557   BUN 45 (H) 06/23/2023 0557   BUN 16 02/18/2023 1449   CREATININE 1.48 (H) 06/23/2023 0557   CALCIUM 8.5 (L) 06/23/2023 0557   EGFR 29.0 03/13/2023 1445  EGFR 28 (L) 02/18/2023 1449   GFRNONAA 36 (L) 06/23/2023 0557    IMAGING past 24 hours MR ANGIO HEAD WO CONTRAST  Result Date: 07/20/23 CLINICAL DATA:  Stroke follow-up EXAM: MRA HEAD WITHOUT CONTRAST TECHNIQUE: Angiographic images of the Circle of Willis were acquired using MRA technique without intravenous contrast. COMPARISON:  04/16/2022 FINDINGS: POSTERIOR CIRCULATION: --Vertebral arteries: Normal  --Inferior cerebellar arteries: Normal. --Basilar artery: Normal. --Superior cerebellar arteries: Normal. --Posterior cerebral arteries: Normal.  Left P-comm is patent. ANTERIOR CIRCULATION: --Intracranial internal carotid arteries: Chronic occlusion of the left ICA. Normal right ICA. --Anterior cerebral arteries (ACA): Normal. --Middle cerebral arteries (MCA): Normal. IMPRESSION: Chronic occlusion of the left ICA.  No acute finding. Electronically Signed   By: Deatra Robinson M.D.   On: 07-20-23 22:18    Vitals:   06/23/23 0300 06/23/23 0403 06/23/23 0549 06/23/23 0830  BP: (!) 130/39 (!) 119/42 (!) 148/55 (!) 131/48  Pulse:    63  Resp:    20  Temp: (!) 97.5 F (36.4 C)   98 F (36.7 C)  TempSrc: Oral   Oral  SpO2:    98%  Weight:      Height:         PHYSICAL EXAM General:  Alert, well-nourished, elderly African-American lady in no acute distress Psych:  Mood and affect appropriate for situation CV: Regular rate and rhythm on monitor Respiratory:  Regular, unlabored respirations on room air GI: Abdomen soft and nontender   NEURO:  Mental Status: AA&O to self, situation, year Speech/Language: speech is with minimal dysarthria.     Naming, repetition, fluency, and comprehension intact.   .  Cranial Nerves:  II: Visual fields full.  +/- Right visual field defect. III, IV, VI: EOMI. Eyelids elevate symmetrically.  V: Sensation is intact to light touch and symmetrical to face.  VII: Face is symmetrical resting and smiling VIII: hearing intact to voice. IX, X: Palate elevates symmetrically. Phonation is normal.  WU:[id number] shrug 5/5. XII: tongue is midline without fasciculations. Motor: 2+/5 for bilateral lower extremities but effort poor, all other strength within normal limits Tone: is normal and bulk is normal..    Sensation- Intact to light touch bilaterally. Coordination: FTN intact bilaterally, HKS: no ataxia in BLE.No drift.  Gait- deferred  Overall:  Nonfocal  ASSESSMENT/PLAN  Acute/subacute parietal ischemic infarct likely secondary to hypoperfusion in the setting of sepsis and hypotension in a patient with chronic left carotid occlusion MRI 1.8 cm focus FLAIR hyperintense involving cortical and subcortical aspect was parietal lobe -- likely reflecting evolving subacute ischemic infarct.  New from 01/02/2023. MRA: Chronic occlusion of left ICA Carotid Doppler right carotid ECA greater than 50% stenosed, patient stent in place.  Left carotid showed hemodynamically significant plaque greater than 50% in common carotid artery.  Vertebral arteries WNL. EEG 9/1: No epileptiform abnormalities seen LDL 27 HgbA1c 5.9 VTE prophylaxis -aspirin 81 mg, subcu heparin, discontinue Plavix, start Brilinta 90 mg.  To be discharged on aspirin/Brilinta on DAPT, as aspirin/Plavix regimen has failed. Therapy recommendations: PT/OT recommends SNF Disposition: SNF  Hx of Stroke/TIA Left carotid artery occlusion leading to CVA (2020), on aspirin/Plavix  Hypertension Patient BPs were 110s/40s on 9/7 and July 20, 2023 when AMS became pronounced and stroke was found -- possible that hypoperfusion of parietal lobe in the setting of chronic atherosclerosis led to current presentation.  Home meds: Amlodipine 10, clonidine 0.1 twice daily, Imdur 60 mg daily, Toprol 100 mg daily Stable Blood Pressure Goal: BP less than 180/105  Hyperlipidemia Home meds: Lipitor 40 mg, resumed in hospital LDL 27, goal < 70 Continue statin at discharge  Tobacco Abuse 50-pack-year smoking history, quit 12 years ago  Dysphagia Patient has dysphagia, SLP consulted    Diet   DIET DYS 3 Room service appropriate? Yes with Assist; Fluid consistency: Nectar Thick   Advance diet as tolerated  Other Stroke Risk Factors Obesity, Body mass index is 31.71 kg/m., BMI >/= 30 associated with increased stroke risk, recommend weight loss, diet and exercise as appropriate  Family hx stroke  (father)   Hospital day # 75  I have personally obtained history,examined this patient, reviewed notes, independently viewed imaging studies, participated in medical decision making and plan of care.ROS completed by me personally and pertinent positives fully documented  I have made any additions or clarifications directly to the above note. Agree with note above.  Patient with chronic left carotid occlusion admitted with sepsis and hypotension and MRI scan shows a left parietal infarct likely due to failure of collaterals.  Recommend permissive hypertension with systolic blood pressure 130-150 range.  Recommend aspirin and Brilinta as patient was already on aspirin Plavix which has failed and patient had a recent cardiac stent May 2024.  Maintain aggressive risk factor modification.  Mobilize out of bed.  Therapy consults.  Long discussion with patient and with Dr. Randol Kern and answered questions.  Greater than 50% time during this 50-minute visit was spent in counseling and coordination of care about her stroke and discussion about carotid occlusion and answering questions.  Delia Heady, MD Medical Director [facility] Stroke Center Pager: [phone] 06/23/2023 5:51 PM   To contact Stroke Continuity provider, please refer to [email or web address]. After hours, contact General Neurology

## 2023-06-23 NOTE — Progress Notes (Signed)
Carotid arterial duplex completed. Please see CV Procedures for preliminary results.  Shona Simpson, RVT 06/23/23 10:08 AM

## 2023-06-23 NOTE — Procedures (Signed)
Patient Name: EMMALYSE NICHOLES  MRN: [id number]  Epilepsy Attending: Charlsie Quest  Referring Physician/Provider: Starleen Arms, MD  Date: 06/23/2023 Duration: 23.23 mins  Patient history: 77yo F with left parietal stroke getting eeg to evaluate for seizure  Level of alertness: Awake  AEDs during EEG study: LEV  Technical aspects: This EEG study was done with scalp electrodes positioned according to the 10-20 International system of electrode placement. Electrical activity was reviewed with band pass filter of 1-70Hz , sensitivity of 7 uV/mm, display speed of 60mm/sec with a 60Hz  notched filter applied as appropriate. EEG data were recorded continuously and digitally stored.  Video monitoring was available and reviewed as appropriate.  Description: The posterior dominant rhythm consists of 7.5 Hz activity of moderate voltage (25-35 uV) seen predominantly in posterior head regions, symmetric and reactive to eye opening and eye closing. EEG showed continuous generalized 6 to 7 Hz theta slowing. Physiologic photic driving was not seen during photic stimulation.  Hyperventilation was not performed.     ABNORMALITY - Continuous slow, generalized  IMPRESSION: This study is suggestive of mild to moderate diffuse encephalopathy. No seizures or epileptiform discharges were seen throughout the recording.  Kelin Nixon [name]

## 2023-06-23 NOTE — TOC Progression Note (Signed)
Transition of Care [facility]) - Progression Note    Patient Details  Name: AYANNI OSU MRN: [id number] Date of Birth: 07/17/1946  Transition of Care [facility]) CM/SW Contact  Mearl Latin, LCSW Phone Number: 06/23/2023, 9:41 AM  Clinical Narrative:    CSW continuing to follow for discharge planning needs. Cortrak remains in place.         Expected Discharge Plan and Services                                               Social Determinants of Health (SDOH) Interventions SDOH Screenings   Food Insecurity: No Food Insecurity (02/12/2023)  Housing: Patient Unable To Answer (02/12/2023)  Transportation Needs: No Transportation Needs (02/12/2023)  Utilities: Not At Risk (02/12/2023)  Tobacco Use: Medium Risk (06/04/2023)    Readmission Risk Interventions    06/04/2023    3:49 PM  Readmission Risk Prevention Plan  Transportation Screening Complete  Medication Review (RN Care Manager) Referral to Pharmacy  PCP or Specialist appointment within 3-5 days of discharge Complete  HRI or Home Care Consult Complete  SW Recovery Care/Counseling Consult Complete  Palliative Care Screening Not Applicable  Skilled Nursing Facility Not Applicable

## 2023-06-24 ENCOUNTER — Inpatient Hospital Stay (HOSPITAL_COMMUNITY): Payer: Medicare HMO

## 2023-06-24 DIAGNOSIS — D649 Anemia, unspecified: Secondary | ICD-10-CM | POA: Diagnosis not present

## 2023-06-24 DIAGNOSIS — J9601 Acute respiratory failure with hypoxia: Secondary | ICD-10-CM | POA: Diagnosis not present

## 2023-06-24 DIAGNOSIS — I639 Cerebral infarction, unspecified: Secondary | ICD-10-CM | POA: Diagnosis not present

## 2023-06-24 DIAGNOSIS — R410 Disorientation, unspecified: Secondary | ICD-10-CM | POA: Diagnosis not present

## 2023-06-24 LAB — GLUCOSE, CAPILLARY
Glucose-Capillary: 142 mg/dL — ABNORMAL HIGH (ref 70–99)
Glucose-Capillary: 120 mg/dL — ABNORMAL HIGH (ref 70–99)
Glucose-Capillary: 99 mg/dL (ref 70–99)
Glucose-Capillary: 108 mg/dL — ABNORMAL HIGH (ref 70–99)
Glucose-Capillary: 96 mg/dL (ref 70–99)

## 2023-06-24 LAB — CBC
HCT: 23.3 % — ABNORMAL LOW (ref 36.0–46.0)
Hemoglobin: 7.2 g/dL — ABNORMAL LOW (ref 12.0–15.0)
MCH: 29.5 pg (ref 26.0–34.0)
MCHC: 30.9 g/dL (ref 30.0–36.0)
MCV: 95.5 fL (ref 80.0–100.0)
Platelets: 254 10*3/uL (ref 150–400)
RBC: 2.44 MIL/uL — ABNORMAL LOW (ref 3.87–5.11)
RDW: 16.1 % — ABNORMAL HIGH (ref 11.5–15.5)
WBC: 6.4 10*3/uL (ref 4.0–10.5)
nRBC: 0 % (ref 0.0–0.2)

## 2023-06-24 LAB — TYPE AND SCREEN
ABO/RH(D): A POS
Antibody Screen: NEGATIVE

## 2023-06-24 LAB — PREPARE RBC (CROSSMATCH)

## 2023-06-24 MED ORDER — MIRTAZAPINE 15 MG PO TBDP
15.0000 mg | ORAL_TABLET | Freq: Every day | ORAL | Status: DC
  Administered 2023-06-24 – 2023-07-02 (×8): 15 mg via ORAL
  Filled 2023-06-24 (×11): qty 1

## 2023-06-24 MED ORDER — HYDRALAZINE HCL 20 MG/ML IJ SOLN: 5.0000 mg | INTRAMUSCULAR | Status: DC | PRN

## 2023-06-24 MED ORDER — SODIUM CHLORIDE 0.9% IV SOLUTION: Freq: Once | INTRAVENOUS | Status: AC

## 2023-06-24 MED ORDER — VIVONEX RTF PO LIQD: 1000.0000 mL | ORAL | Status: DC

## 2023-06-24 MED ORDER — VIVONEX RTF PO LIQD
1000.0000 mL | ORAL | Status: DC
  Administered 2023-06-24 – 2023-06-25 (×2): 1000 mL
  Filled 2023-06-24 (×4): qty 1000

## 2023-06-24 NOTE — Progress Notes (Signed)
Speech Language Pathology Treatment: Dysphagia  Patient Details Name: [name] MRN: [id number] DOB: 08-Dec-1945 Today's Date: 06/24/2023 Time: 1339-1400 SLP Time Calculation (min) (ACUTE ONLY): 21 min  Assessment / Plan / Recommendation Clinical Impression  Pt seen for swallowing and EMST (expiratory muscle strength training). Pt needed much encouragement today due to pain in her bottom and having some "anxiety". She consumed nectar thick water with delayed cough and mildly increased work of breathing. She did not want to eat but was agreeable to a bite of cookie and small piece of hamburger with prolonged mastication with hamburger and minimal residue. Mastication may have been more prolonged due to slightly weakened condition today. Recommend continue Dys 3, nectar thick liquids. Pt still has Cortrak due to decreased intake. She is not ready for repeat MBS at this time for potential upgrade.  Attempted EMST and she performed one set of 5 repetitions and repeatedly stated "I can't do anymore."    HPI HPI: Pt is a 77 yo female presenting 8/21 with shortness of breath, cough, hypoxia. ETT 8/21-8/28. Pt was seen by SLP most recently in July 2023, recommending regular solids and thin liquids but noting lingual abnormalities and reduced ROM secondary to lingual trauma). Esophagram (November 2021) showed penetration of thin liquids, sensed aspiration with thicker barium, mild esophageal dysmotility, and HH with mild narrowing of the GE junction.   FEES at outside hospital in 2019 [facility]. Imaging from [facility] in 2019 reviewed (report not available) with transient penetration and vallecular residue noted. PMH includes: GERD, stroke, recurrent PNA, seizures, sleep apnea, DM, HTN, DVT, B12 deficiency, CAD, colostomy, chronic pancreatitis      SLP Plan  Continue with current plan of care      Recommendations for follow up therapy are one component of a multi-disciplinary discharge planning process, led by the  attending physician.  Recommendations may be updated based on patient status, additional functional criteria and insurance authorization.    Recommendations  Diet recommendations: Dysphagia 3 (mechanical soft);Nectar-thick liquid Liquids provided via: Cup;Straw Medication Administration: Whole meds with puree Supervision: Staff to assist with self feeding Compensations: Slow rate;Small sips/bites Postural Changes and/or Swallow Maneuvers: Seated upright 90 degrees;Upright 30-60 min after meal                  Oral care BID   Intermittent Supervision/Assistance Dysphagia, unspecified (R13.10)     Continue with current plan of care     Royce Macadamia  06/24/2023, 2:30 PM

## 2023-06-24 NOTE — Progress Notes (Addendum)
STROKE TEAM PROGRESS NOTE   BRIEF HPI [name] is a 77 y.o. female with history of recurrent pneumonia, CVA, seizure, hypertension, diabetes, significant left carotid artery stenosis, DVT not on home antiplatelet therapy presented to [facility] ED severely hypoxic found to have pneumonia.  This led to an extended ICU stay requiring intubation from 8/21 to 8/28.  Patient has had significant agitation over course of stay, likely ICU delirium, requiring chemical restraint.  Floor team sought further imaging as patient remained significantly confused after extubation, off of baseline to rule out CVA.  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  On 9/9, carotid duplex showed hemodynamically significant plaque greater than 50% in left common carotid artery.  Patent stent in right PCA patent.   SIGNIFICANT HOSPITAL EVENTS Per IM note:  8/21 admitted, intubated 8/22 Remains on vent with some issues with agitation, added Precedex drip  8/24 agitated on trying to reduce sedation 8/25-agitation on reducing sedation 8/26 ABG with hypercapnia and hypercarbia, fentanyl increased and propofol maintained for better sedation , very dyssynchronous to vent.  Switched from precedex to propofol 8/27 poorly responsive with tachycardia, HTN and vent dyssynchrony, tolerated PSV better  8/28 sedation stopped/ extubated, hypertensive started on cleviprex 8/29 remains stable postextubation, mentation improving and off Precedex.  Diuresing well 8/30 continues to remain significantly encephalopathic, withdraws to verbal stimuli but will not follow any commands 8/31 no acute issues overnight, remains encephalopathic but slightly more alert this a.m 9/1 no acute events overnight, mentation much improved this a.m. alert and oriented x  2 9/7 MRI significant for subacute CVA  INTERIM HISTORY/SUBJECTIVE  On interview, patient appears similar to yesterday. Neurological exam non-focal. Still reduced strength in lower and upper limbs, likely 2/2 deconditioning after prolonged ICU stay. PT/OT recommend SNF. Alert and oriented to interview. Noted that she was trying to eat more in order to remove her NGT faster.   OBJECTIVE  CBC    Component Value Date/Time   WBC 6.4 06/24/2023 0342   RBC 2.44 (L) 06/24/2023 0342   HGB 7.2 (L) 06/24/2023 0342   HGB 12.1 02/06/2023 1345   HCT 23.3 (L) 06/24/2023 0342   HCT 37.9 02/06/2023 1345   PLT 254 06/24/2023 0342   PLT 251 02/06/2023 1345   MCV 95.5 06/24/2023 0342   MCV 94 02/06/2023 1345   MCH 29.5 06/24/2023 0342   MCHC 30.9 06/24/2023 0342   RDW 16.1 (H) 06/24/2023 0342   RDW 12.8 02/06/2023 1345   LYMPHSABS 2.3 06/14/2023 1501   MONOABS 1.1 (H) 06/14/2023 1501   EOSABS 0.3 06/14/2023 1501   BASOSABS 0.0 06/14/2023 1501    BMET    Component Value Date/Time   NA 136 06/23/2023 0557   NA 141 02/18/2023 1449   K 4.1 06/23/2023 0557   CL 108 06/23/2023 0557   CO2 21 (L) 06/23/2023 0557   GLUCOSE 186 (H) 06/23/2023 0557   BUN 45 (H) 06/23/2023 0557   BUN 16 02/18/2023 1449   CREATININE 1.48 (H) 06/23/2023 0557   CALCIUM 8.5 (L) 06/23/2023 0557   EGFR 29.0 03/13/2023 1445   EGFR 28 (L) 02/18/2023 1449   GFRNONAA 36 (L) 06/23/2023 0557    IMAGING past 24 hours EEG adult  Result Date: 06/23/2023 Charlsie Quest, MD     06/23/2023  4:00 PM  Patient Name: [name] MRN: [id number] Epilepsy Attending: Charlsie Quest Referring Physician/Provider: Starleen Arms, MD Date: 06/23/2023 Duration: 23.23 mins Patient history: 77yo F with left parietal stroke getting eeg to evaluate for seizure Level of alertness: Awake AEDs during EEG study: LEV Technical aspects: This EEG study was done with scalp electrodes positioned according to the 10-20 International system of  electrode placement. Electrical activity was reviewed with band pass filter of 1-70Hz , sensitivity of 7 uV/mm, display speed of 10mm/sec with a 60Hz  notched filter applied as appropriate. EEG data were recorded continuously and digitally stored.  Video monitoring was available and reviewed as appropriate. Description: The posterior dominant rhythm consists of 7.5 Hz activity of moderate voltage (25-35 uV) seen predominantly in posterior head regions, symmetric and reactive to eye opening and eye closing. EEG showed continuous generalized 6 to 7 Hz theta slowing. Physiologic photic driving was not seen during photic stimulation.  Hyperventilation was not performed.   ABNORMALITY - Continuous slow, generalized IMPRESSION: This study is suggestive of mild to moderate diffuse encephalopathy. No seizures or epileptiform discharges were seen throughout the recording. Charlsie Quest    Vitals:   06/24/23 1141 06/24/23 1225 06/24/23 1300 06/24/23 1319  BP: (!) 147/64 (!) 165/67 (!) 162/59 (!) 179/62  Pulse:  63 66 67  Resp: 16 16 14 16   Temp: 97.7 F (36.5 C) 97.6 F (36.4 C) 97.9 F (36.6 C) 98.1 F (36.7 C)  TempSrc: Oral Oral Oral Oral  SpO2: 95%  97% 98%  Weight:      Height:         PHYSICAL EXAM General:  Alert, well-nourished, elderly lady in no acute distress Psych:  Mood and affect appropriate for situation CV: Regular rate and rhythm on monitor Respiratory:  Regular, unlabored respirations on room air GI: Abdomen soft and nontender   NEURO:  Mental Status: AA&O to self, situation, year Speech/Language: speech is with minimal dysarthria. Naming, repetition, fluency, and comprehension intact.   .  Cranial Nerves:  II: Visual fields full.  III, IV, VI: EOMI. Eyelids elevate symmetrically.  V: Sensation is grossly intact. VII: Face is symmetrical resting and smiling. VIII: hearing intact to voice. IX, X: Palate elevates symmetrically. Phonation is normal.  XII: tongue is midline  without fasciculations. Motor: 3/5 for bilateral upper and lower extremities. Tone: is normal and bulk is normal. Sensation: Intact to light touch bilaterally. Coordination: FTN intact bilaterally, HKS: no ataxia in BLE. No drift.  Gait: deferred.  Overall: Nonfocal  ASSESSMENT/PLAN  Acute/subacute parietal ischemic infarct likely secondary to hypoperfusion in the setting of sepsis and hypotension in a patient with chronic left carotid occlusion  Carotid US showed known chronic L CCA occlusion. Will begin Aspirin/Brillinta therapy. Stroke team will sign off at this time.   MRI 1.8 cm focus FLAIR hyperintense involving cortical and subcortical aspect was parietal lobe -- likely reflecting evolving subacute ischemic infarct.  New from 01/02/2023. MRA: Chronic occlusion of left ICA Carotid Doppler right carotid ECA greater than 50% stenosed, patient stent in place.  Left carotid showed hemodynamically significant plaque greater than 50% in common carotid artery.  Vertebral arteries WNL. EEG 9/1: No epileptiform abnormalities seen LDL 27 HgbA1c 5.9 VTE prophylaxis -aspirin 81 mg, subcu heparin, discontinue Plavix, start Brilinta 90 mg. Cardiac stent placed 02/2023 Discharge on aspirin/Brilinta on DAPT, as aspirin/Plavix regimen has failed. Therapy recommendations: PT/OT recommends SNF Disposition: SNF  Hx of Stroke/TIA Left carotid artery occlusion leading to CVA (2020), on aspirin/Plavix  Hypertension Patient BPs were 110s/40s on 9/7 and Jul 21, 2023 when AMS became pronounced and stroke was found -- possible that hypoperfusion of parietal lobe in the setting of chronic atherosclerosis led to current presentation.  Home meds: Amlodipine 10, clonidine 0.1 twice daily, Imdur 60 mg daily, Toprol 100 mg daily Stable Blood Pressure Goal: BP less than 180/105   Hyperlipidemia Home meds: Lipitor 40 mg, resumed in hospital LDL 27, goal < 70 Continue statin at discharge  Tobacco Abuse 50-pack-year  smoking history, quit 12 years ago  Dysphagia Patient has dysphagia, SLP consulted    Diet   DIET DYS 3 Room service appropriate? Yes with Assist; Fluid consistency: Nectar Thick   Advance diet as tolerated  Other Stroke Risk Factors Obesity, Body mass index is 31.91 kg/m., BMI >/= 30 associated with increased stroke risk, recommend weight loss, diet and exercise as appropriate  Family hx stroke (father)   Hospital day # 20   Patient with chronic left carotid occlusion admitted with sepsis and hypotension and MRI scan shows a left parietal infarct likely due to failure of collaterals.  Recommend permissive hypertension with systolic blood pressure 130-150 range.  Recommend aspirin and Brilinta as patient was already on aspirin Plavix which has failed and patient had a recent cardiac stent May 2024.  Maintain aggressive risk factor modification.  Mobilize out of bed.  Therapy consults.  Long discussion with patient and with Dr. Randol Kern and answered questions.  Greater than 50% time during this 25 minute visit was spent in counseling and coordination of care about her stroke and discussion about carotid occlusion and answering questions. Stroke team will sign off.  Kindly call for questions. Delia Heady, MD Medical Director [facility] Stroke Center Pager: [phone] 06/24/2023 3:13 PM   To contact Stroke Continuity provider, please refer to [email or web address]. After hours, contact General Neurology

## 2023-06-24 NOTE — Progress Notes (Signed)
Nutrition Follow-up  DOCUMENTATION CODES:   Not applicable  INTERVENTION:   - Start 48-hour calorie count   Continue nocturnal tube feeds via Cortrak: - Vivonex RTF @ 40 ml/hr x 12 hours from 1800 to 0600 (total of 480 ml nightly)  Nocturnal tube feeding regimen provides 480 kcal, 24 grams of protein, and 408 ml of H2O (meets 29% of minimum kcal needs and 28% of minimum protein needs).  - Continue Banatrol TF TID per tube  - Continue Mighty Shake TID with meals, each supplement provides 330 kcals and 9 grams of protein  - Encourage PO intake   - Added to feeder list   -MD ordering appetite stimulant   NUTRITION DIAGNOSIS:   Inadequate oral intake related to inability to eat as evidenced by NPO status.  Progressing, being addressed via PO diet, oral nutrition supplements, and nocturnal tube feeds  GOAL:   Patient will meet greater than or equal to 90% of their needs  Progressing, being addressed via PO diet, oral nutrition supplements, and nocturnal tube feeds  MONITOR:   PO intake, Supplement acceptance, Diet advancement, Labs, Weight trends, TF tolerance, I & O's  REASON FOR ASSESSMENT:   Ventilator, Consult Enteral/tube feeding initiation and management  ASSESSMENT:   77 year old female who presented to the ED on 8/21 with respiratory distress. PMH of recurrent pneumonias, vitamin B12 deficiency, stroke, seizures, HTN, GERD, DM, DVT, chronic pancreatitis, CAD, colostomy. Pt admitted with pneumonia, sepsis.  8/21 - intubated, intermittent TF via pump started 8/22 - pt with small volume emesis, changed to continuous TF with Vivonex 8/23 - NG tube exchanged for Cortrak (tip gastric) 8/28 - extubated 9/02 - MBS, diet advanced to dysphagia 3 with nectar-thick liquids 9/03 - TF changed to nocturnal  Visited patient at bedside who reports she is eating and trying to drink Ensure. RD spoke with patient's RN who reports she has consistently been consuming 25% of her  meals. Patient reports she did well with eating breakfast this morning. Patient's neurologist joined the conversation at bedside and wishes to restart calorie count due to no meal tickets collected for data quantification for the calorie count that was ordered last weekend.   RD to decrease patient's nocturnal TF rate as well to see if her appetite improves during the day. Per MD note, patient is not a good candidate for PEG placement due to large ventral hernia.   Attending MD and RD secure chatted about patient's nutrition plan. MD reports he even tries to feed her when he visits and she requires maximum encouragement for po intake, but she severely lacks an appetite. RD added patient to feeder list and MD to add an appetite stimulant.   Labs:  Meds:   Wt:  PO:   I/O's:   Diet Order:   Diet Order             DIET DYS 3 Room service appropriate? Yes with Assist; Fluid consistency: Nectar Thick  Diet effective now                   EDUCATION NEEDS:   Not appropriate for education at this time  Skin:  Skin Assessment: Reviewed RN Assessment (skin tear buttocks)  Last BM:  06/20/23  Height:   Ht Readings from Last 1 Encounters:  06/04/23 5\' 3"  (1.6 m)    Weight:   Wt Readings from Last 1 Encounters:  06/24/23 81.7 kg    Ideal Body Weight:  52.3 kg  BMI:  Body  mass index is 31.91 kg/m.  Estimated Nutritional Needs:   Kcal:  1650-1850  Protein:  85-100 grams  Fluid:  1.6-1.8 L    Leodis Rains, RDN, LDN  Clinical Nutrition

## 2023-06-24 NOTE — Progress Notes (Addendum)
Physical Therapy Treatment Patient Details Name: [name] MRN: [id number] DOB: January 09, 1946 Today's Date: 06/24/2023   History of Present Illness Pt is 77 year old presented to [facility] on  06/04/23 for acute respiratory failure due to PNA. Intubatd on admission. Extubated 8/28. Pt encephaloathic post extubation. 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    PT Comments  Pt received in supine, c/o anxiety during bed mobility and supine BLE exercise instruction. Pt self-limiting due to anxiety and refusing to attempt EOB/OOB mobility, of note she did get OOB with OT in AM and recently transferred to different hospital room. Pt with soiled bed pad and needed total A for peri-care in supine and up to modA for rolling and totalA +2 to scoot toward HOB. Pt instructed on supine BLE exercises and use of IS but will need reinforcement due to pt cognitive deficit and pt perseverating on anxiety today. Pt continues to benefit from PT services to progress toward functional mobility goals.     If plan is discharge home, recommend the following: A lot of help with bathing/dressing/bathroom;Assist for transportation;Help with stairs or ramp for entrance;Two people to help with walking and/or transfers;Assistance with cooking/housework;Supervision due to cognitive status;Direct supervision/assist for medications management;Direct supervision/assist for financial management   Can travel by private vehicle     No  Equipment Recommendations  Other (comment) (defer to next venue of care)     Recommendations for Other Services       Precautions / Restrictions Precautions Precautions: Fall Precaution Comments: Cortrak, RUE painful for BP cuff, avoid low BP per neuro Restrictions Weight Bearing Restrictions: No     Mobility  Bed Mobility Overal bed mobility: Needs Assistance Bed Mobility: Rolling, Supine to Sit Rolling: Min assist, Mod assist, Used rails   Supine to sit: Min assist, HOB elevated, Used rails     General bed mobility comments: pt agreeable briefly with max encouragement, but not able to sustain due to c/o bottom pain and anxiety. Pt rolling to L/R sides x2 reps each for peri-care due to soiled bed pad/purewick. New purewick applied during session with NT present to assist pt totalA for hygiene. Pt +2 totalA for posterior supine scooting toward HOB with bed in trend.    Transfers Overall transfer level: Needs assistance                 General transfer comment: pt defers due to c/o severe anxiety and fatigue after sitting up in chair during/after OT session.    Ambulation/Gait                   Stairs             Wheelchair Mobility     Tilt Bed    Modified Rankin (Stroke Patients Only) Modified Rankin (Stroke Patients Only) Pre-Morbid Rankin Score: Slight disability Modified Rankin: Severe disability     Balance Overall balance assessment: Needs assistance     Sitting balance - Comments: pt defers today       Standing balance comment: pt defers today  Cognition Arousal: Alert Behavior During Therapy: Anxious, Lability Overall Cognitive Status: Impaired/Different from baseline Area of Impairment: Attention, Memory, Following commands, Safety/judgement, Awareness, Problem solving                 Orientation Level: Disoriented to, Situation (oriented to self, location, time, not fully to situation (can't recall why she came to hospital)) Current Attention Level:  Sustained Memory: Decreased recall of precautions, Decreased short-term memory Following Commands: Follows one step commands consistently Safety/Judgement: Decreased awareness of safety, Decreased awareness of deficits Awareness: Emergent Problem Solving: Slow processing, Requires verbal cues General Comments: Pt required encouragement to participate and did not want to get OOB due to anxiety. Pt c/o panic attack with minimal exertion/bed mobility. RN notified. Of note, pt had just transferred room from 5W36 to 5W08.        Exercises General Exercises - Lower Extremity Ankle Circles/Pumps: AROM, AAROM, Both, 10 reps, Supine Quad Sets: AAROM, Both, 5 reps, Supine (tactile cues) Heel Slides: AAROM, Both, 10 reps, Supine Hip ABduction/ADduction: AAROM, Both, 10 reps, Supine Straight Leg Raises: PROM, AAROM, Both, 5 reps, Supine Other Exercises Other Exercises: IS x 5 reps, cues for technique (pt achieves ~500-700 mL)    General Comments General comments (skin integrity, edema, etc.): SpO2 WFL on RA; HR WFL 60's bpm resting. SBP elevated but <180.      Pertinent Vitals/Pain Pain Assessment Pain Assessment: Faces Faces Pain Scale: Hurts whole lot Pain Location: bottom, generalized Pain Descriptors / Indicators: Sore, Grimacing, Moaning Pain Intervention(s): Limited activity within patient's tolerance, Monitored during session, Repositioned, Patient requesting pain meds-RN notified, Other (comment) (pt requesting anxiety medications)     PT Goals (current goals can now be found in the care plan section) Acute Rehab PT Goals Patient Stated Goal: go home PT Goal Formulation: With patient Time For Goal Achievement: 06/29/23 Progress towards PT goals: Not progressing toward goals - comment (poor participation this date during PT session)    Frequency    Min 1X/week      PT Plan         AM-PAC PT "6 Clicks" Mobility   Outcome Measure  Help needed turning from your back to  your side while in a flat bed without using bedrails?: A Lot Help needed moving from lying on your back to sitting on the side of a flat bed without using bedrails?: Total Help needed moving to and from a bed to a chair (including a wheelchair)?: A Lot (based on AM session with OT, too anxious to attempt in PM with PTA) Help needed standing up from a chair using your arms (e.g., wheelchair or bedside chair)?: A Lot Help needed to walk in hospital room?: Total Help needed climbing 3-5 steps with a railing? : Total 6 Click Score: 9    End of Session   Activity Tolerance: Patient limited by fatigue;Treatment limited secondary to medical complications (Comment);Other (comment) (anxiety limiting participation) Patient left: in bed;with call bell/phone within reach;with bed alarm set;Other (comment);with SCD's reapplied (bed in chair posture, heels floated, pt encouraged to eat lunch as per RN her MD wants pt to eat before taking anxiety meds) Nurse Communication: Mobility status;Other (comment) (anxiety) PT Visit Diagnosis: Unsteadiness on feet (R26.81);Other abnormalities of gait and mobility (R26.89);Muscle weakness (generalized) (M62.81)     Time: 1610-9604 PT Time Calculation (min) (ACUTE ONLY): 34 min  Charges:    $Therapeutic Exercise: 8-22 mins $Therapeutic Activity: 8-22 mins PT General Charges $$ ACUTE PT VISIT: 1 Visit  Florina Ou., PTA Acute Rehabilitation Services Secure Chat Preferred 9a-5:30pm Office: [phone]    Angus Palms 06/24/2023, 2:50 PM

## 2023-06-24 NOTE — Plan of Care (Signed)
  Problem: Education: Goal: Knowledge of General Education information will improve Description Including pain rating scale, medication(s)/side effects and non-pharmacologic comfort measures Outcome: Progressing   Problem: Health Behavior/Discharge Planning: Goal: Ability to manage health-related needs will improve Outcome: Progressing   Problem: Clinical Measurements: Goal: Ability to maintain clinical measurements within normal limits will improve Outcome: Progressing Goal: Will remain free from infection Outcome: Progressing Goal: Diagnostic test results will improve Outcome: Progressing Goal: Respiratory complications will improve Outcome: Progressing   Problem: Activity: Goal: Risk for activity intolerance will decrease Outcome: Progressing   Problem: Nutrition: Goal: Adequate nutrition will be maintained Outcome: Progressing   

## 2023-06-24 NOTE — Progress Notes (Signed)
PROGRESS NOTE    [name]  NWG:[id number] DOB: Dec 01, 1945 DOA: 06/04/2023 PCP: Garlan Fillers, MD   Chief Complaint  Patient presents with   Respiratory Distress    Brief Narrative:   77 year old with history of recurrent pneumonia, vitamin B12 deficiency, CVA, seizure, HTN, GERD, diabetes, DVT admitted for shortness of breath. Initially in the ER severely hypoxic requiring BiPAP but eventually had to be intubated on 8/21. Initially started on vancomycin, steroids IV Rocephin. Eventually completed 7 days of Rocephin, 5 days of steroids and she was extubated on 8/28. Postextubation developed encephalopathy which improved over the course of 3-4 days. Speech and swallow performed MBS, and D3 diet recommended.     Significant Hospital Events:   8/21 admitted, intubated 8/22 Remains on vent with some issues with agitation, added Precedex drip  8/24 agitated on trying to reduce sedation 8/25-agitation on reducing sedation 8/26 ABG with hypercapnia and hypercarbia, fentanyl increased and propofol maintained for better sedation , very dyssynchronous to vent.  Switched from precedex to propofol 8/27 poorly responsive with tachycardia, HTN and vent dyssynchrony, tolerated PSV better  8/28 sedation stopped/ extubated, hypertensive started on cleviprex 8/29 remains stable postextubation, mentation improving and off Precedex.  Diuresing well 8/30 continues to remain significantly encephalopathic, withdraws to verbal stimuli but will not follow any commands 8/31 no acute issues overnight, remains encephalopathic but slightly more alert this a.m 9/1 no acute events overnight, mentation much improved this a.m. alert and oriented x 2 9/7 MRI significant for subacute CVA 9/10, 20 PRBC transfusion given progressive anemia in the setting of significant bruisings.   Assessment & Plan:   Principal Problem:   Acute respiratory failure with hypoxia (HCC)    Acute hypoxic respiratory  failure, improved Sepsis secondary to community-acquired pneumonia History of OSA on CPAP -Patient required intubation on 8/21, she was extubated 8/28 -  she remains on 2 L oxygen, remains confused, difficult to comply with incentive spirometry and flutter valve -Completed 7 days of antibiotics as of 8/27, cultures negative from 8/21 -She remains at risk of aspiration. -Completed antibiotic course   Acute metabolic encephalopathy Failure to thrive - There was concerns of ICU delirium.  BUN was also elevated.   -EEG, global cerebral dysfunction but no seizures.  - ABG has been obtained , as she was more sleepy, pCO2 of 34, no evidence of retention. -MRI significant for acute CVA -Remains with very poor oral intake, have considered PEG tube as discussed with son, but unfortunately and amendable by IR secondary to large ventral hernia, and she is aspirin and Brilinta, which make it difficult for surgical PEG, but mentation has been improving over last couple days, appetite has been improving as well, so we will continue with current measures. -Nutrition service following closely, will start on Remeron to improve her appetite  Subacute CVA Internal carotid artery disease -MRI brain confirms subacute CVA, she is with known significant left carotid artery disease. -Neurology input greatly appreciated. -2D echo obtained 8/30 with no evidence of embolic source -Follow on repeat carotid ultrasound -Follow on PT EEG -On dual antiplatelet therapy for recent stent May 2024, as discussed with neurology, will keep on aspirin and Brilinta due to failures of aspirin and Plavix.-Allow for permissive hypertension and avoid low blood pressure readings, target systolic blood pressure 140 or above  Uremia -BUN peaked at 98, trending down.   Dysphagia - Had aspiration on recent esophagram with multiple pneumonias over the past year.  Seen by speech and swallow,  moderate aspiration risk.  Status post MBS.   D3 diet.  P.o. intake not adequate, continue NG tube, transition to nocturnal feeding to allow daytime p.o. intake -She is advanced to dysphagia 3, but oral intake remains poor, continue to monitor nocturnal tube feed. -Patient with hypoglycemia, did DC insulin sliding scale and keep monitoring CBG   Diarrhea -Suspect from tube feeds.  C. difficile negative.   Hypernatremia - Resolved   Acute kidney injury on CKD stage IV -Baseline creatinine 1.5.  Admission creatinine 2.17.  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  CAD status post PCI 02/2023 Essential hypertension -On aspirin, statin, Plavix, Lopressor.  Echocardiogram shows preserved EF with grade 1 DD -I have discontinued her hydralazine to avoid low blood pressure reading, and if remains on the lower side I will DC her amlodipine as well   GERD -PPI   Depression -Currently p.o. medicines on hold until mentation remains consistently stable       DVT prophylaxis: Subcu heparin  Code Status: Full code Family Communication: discussed with son by phone 9/7, 9/8, 9/9 Disposition:   Status is: Inpatient    Consultants:  PCCM Pulmonary neurology   Subjective:  Oral intake has been 25% of all her meals yesterday.  Objective: Vitals:   06/24/23 1141 06/24/23 1225 06/24/23 1300 06/24/23 1319  BP: (!) 147/64 (!) 165/67 (!) 162/59 (!) 179/62  Pulse:  63 66 67  Resp: 16 16 14 16   Temp: 97.7 F (36.5 C) 97.6 F (36.4 C) 97.9 F (36.6 C) 98.1 F (36.7 C)  TempSrc: Oral Oral Oral Oral  SpO2: 95%  97% 98%  Weight:      Height:        Intake/Output Summary (Last 24 hours) at 06/24/2023 1353 Last data filed at 06/24/2023 0901 Gross per 24 hour  Intake 420 ml  Output 600 ml  Net -180 ml   Filed Weights   06/20/23 0500  06/22/23 0700 06/24/23 0500  Weight: 81.2 kg 81.2 kg 81.7 kg    Examination:  Awake Alert, Oriented X 3, mentation much improved, he is more interactive, follow commands and answering questions appropriately today Symmetrical Chest wall movement, Good air movement bilaterally, CTAB RRR,No Gallops,Rubs or new Murmurs, No Parasternal Heave +ve B.Sounds, Abd Soft, No tenderness, No rebound - guarding or rigidity. No Cyanosis, Clubbing or edema, she is with significant bruising.       Data Reviewed: I have personally reviewed following labs and imaging studies  CBC: Recent Labs  Lab 06/20/23 0312 06/21/23 0859 06/22/23 0807 06/23/23 0557 06/24/23 0342  WBC 10.7* 8.1 7.4 6.1 6.4  HGB 7.9* 7.7* 8.2* 7.5* 7.2*  HCT 25.4* 24.4* 25.8* 23.7* 23.3*  MCV 94.8 97.6 93.8 96.7 95.5  PLT 270 263 289 257 254    Basic Metabolic Panel: Recent Labs  Lab 06/19/23 0300 06/20/23 0312 06/21/23 0859 06/22/23 0807 06/23/23 0557  NA 141 138 139 137 136  K 4.1 4.3 4.2 4.3 4.1  CL 113* 109 112* 109 108  CO2 19* 18* 19* 20* 21*  GLUCOSE 174* 150* 147* 122* 186*  BUN 61* 58* 53* 50* 45*  CREATININE 1.45* 1.52* 1.59* 1.57* 1.48*  CALCIUM 8.8* 8.6* 8.3* 8.5* 8.5*  MG 2.0 1.9 2.0 1.9 1.8    GFR: Estimated  Creatinine Clearance: 32.7 mL/min (A) (by C-G formula based on SCr of 1.48 mg/dL (H)).  Liver Function Tests: No results for input(s): "AST", "ALT", "ALKPHOS", "BILITOT", "PROT", "ALBUMIN" in the last 168 hours.   CBG: Recent Labs  Lab 06/23/23 2015 06/23/23 2341 06/24/23 0321 06/24/23 0900 06/24/23 1224  GLUCAP 168* 147* 142* 120* 99     Recent Results (from the past 240 hour(s))  C Difficile Quick Screen (NO PCR Reflex)     Status: None   Collection Time: 06/17/23  4:52 AM   Specimen: STOOL  Result Value Ref Range Status   C Diff antigen NEGATIVE NEGATIVE Final   C Diff toxin NEGATIVE NEGATIVE Final   C Diff interpretation No C. difficile detected.  Final    Comment:  Performed at [facility], [address] [place], [place] [address]         Radiology Studies: VAS US CAROTID  Result Date: 06/24/2023 Carotid Arterial Duplex Study Patient Name:  KASSONDRA FUNKE  Date of Exam:   06/23/2023 Medical Rec #: [id number]       Accession #:    [id number] Date of Birth: [birth year]       Patient Gender: F Patient Age:   89 years Exam Location:  [facility] Procedure:      VAS US CAROTID Referring Phys: Elmer Picker --------------------------------------------------------------------------------  Indications:       Carotid artery disease and Right stent. Risk Factors:      Hypertension, hyperlipidemia, Diabetes, coronary artery                    disease, prior CVA, PAD. Limitations        Today's exam was limited due to heavy calcification and the                    resulting shadowing and the patient's inability or                    unwillingness to cooperate. Comparison Study:  05/24/22 - Patent right ICA stent, known left chronic                    occlusion.                    12/26/21 - known left ICA occlusion. Performing Technologist: Shona Simpson  Examination Guidelines: A complete evaluation includes B-mode imaging, spectral Doppler, color Doppler, and power Doppler as needed of all accessible portions of each vessel. Bilateral testing is considered an integral part of a complete examination. Limited examinations for reoccurring indications may be performed as noted.  Right Carotid Findings: +----------+--------+--------+--------+------------------+------------+           PSV cm/sEDV cm/sStenosisPlaque DescriptionComments     +----------+--------+--------+--------+------------------+------------+ CCA Prox  160     23                                             +----------+--------+--------+--------+------------------+------------+ CCA Distal119     16              heterogenous                    +----------+--------+--------+--------+------------------+------------+ ICA Prox  107     21              heterogenous  Patent stent +----------+--------+--------+--------+------------------+------------+ ICA Mid   117     21                                             +----------+--------+--------+--------+------------------+------------+ ICA Distal113     27                                             +----------+--------+--------+--------+------------------+------------+ ECA       420     30      >50%    heterogenous                   +----------+--------+--------+--------+------------------+------------+ +----------+--------+-------+--------+-------------------+           PSV cm/sEDV cmsDescribeArm Pressure (mmHG) +----------+--------+-------+--------+-------------------+ Subclavian210     0                                  +----------+--------+-------+--------+-------------------+ +---------+--------+--+--------+--+---------+ VertebralPSV cm/s70EDV cm/s19Antegrade +---------+--------+--+--------+--+---------+  Right Stent(s): +----------------+--------+--------+-------------+----------+--------+ Prox CCA-Mid ICAPSV cm/sEDV cm/sStenosis     Waveform  Comments +----------------+--------+--------+-------------+----------+--------+ Prox to Stent   161     24      <50% stenosismonophasic         +----------------+--------+--------+-------------+----------+--------+ Proximal Stent  141     25      <50% stenosismonophasic         +----------------+--------+--------+-------------+----------+--------+ Mid Stent       119     16      <50% stenosismonophasic         +----------------+--------+--------+-------------+----------+--------+ Distal Stent    117     21      <50% stenosismonophasic         +----------------+--------+--------+-------------+----------+--------+ Distal to Stent 113     27                   monophasic          +----------------+--------+--------+-------------+----------+--------+   Left Carotid Findings: +----------+--------+--------+--------+------------------+--------+           PSV cm/sEDV cm/sStenosisPlaque DescriptionComments +----------+--------+--------+--------+------------------+--------+ CCA Prox  37      9               calcific                   +----------+--------+--------+--------+------------------+--------+ CCA Mid   104     10      >50%    calcific                   +----------+--------+--------+--------+------------------+--------+ CCA Distal74      6               calcific                   +----------+--------+--------+--------+------------------+--------+ ICA Prox  23      5               calcific                   +----------+--------+--------+--------+------------------+--------+ ICA Mid   30      7                                          +----------+--------+--------+--------+------------------+--------+  ICA Distal64      7                                          +----------+--------+--------+--------+------------------+--------+ ECA       114     13              heterogenous               +----------+--------+--------+--------+------------------+--------+ +----------+--------+--------+--------+-------------------+           PSV cm/sEDV cm/sDescribeArm Pressure (mmHG) +----------+--------+--------+--------+-------------------+ [id number]     0                                   +----------+--------+--------+--------+-------------------+ +---------+--------+--+--------+--+---------+ VertebralPSV cm/s98EDV cm/s20Antegrade +---------+--------+--+--------+--+---------+   Summary: Right Carotid: Non-hemodynamically significant plaque <50% noted in the CCA. The                ECA appears >50% stenosed. Patent stent. Left Carotid: Known chronic left ICA occlusion. Vertebrals:  Bilateral vertebral arteries demonstrate antegrade  flow. Subclavians: Normal flow hemodynamics were seen in bilateral subclavian              arteries. *See table(s) above for measurements and observations.  Electronically signed by Coral Else MD on 06/23/2023 at 3:13:11 PM.    Final (Updated)    EEG adult  Result Date: 06/23/2023 Charlsie Quest, MD     06/23/2023  4:00 PM Patient Name: RIKKI-LEE CORCORAN MRN: [id number] Epilepsy Attending: Charlsie Quest Referring Physician/Provider: Starleen Arms, MD Date: 06/23/2023 Duration: 23.23 mins Patient history: 77yo F with left parietal stroke getting eeg to evaluate for seizure Level of alertness: Awake AEDs during EEG study: LEV Technical aspects: This EEG study was done with scalp electrodes positioned according to the 10-20 International system of electrode placement. Electrical activity was reviewed with band pass filter of 1-70Hz , sensitivity of 7 uV/mm, display speed of 50mm/sec with a 60Hz  notched filter applied as appropriate. EEG data were recorded continuously and digitally stored.  Video monitoring was available and reviewed as appropriate. Description: The posterior dominant rhythm consists of 7.5 Hz activity of moderate voltage (25-35 uV) seen predominantly in posterior head regions, symmetric and reactive to eye opening and eye closing. EEG showed continuous generalized 6 to 7 Hz theta slowing. Physiologic photic driving was not seen during photic stimulation.  Hyperventilation was not performed.   ABNORMALITY - Continuous slow, generalized IMPRESSION: This study is suggestive of mild to moderate diffuse encephalopathy. No seizures or epileptiform discharges were seen throughout the recording. Charlsie Quest   MR ANGIO HEAD WO CONTRAST  Result Date: 06/22/2023 CLINICAL DATA:  Stroke follow-up EXAM: MRA HEAD WITHOUT CONTRAST TECHNIQUE: Angiographic images of the Circle of Willis were acquired using MRA technique without intravenous contrast. COMPARISON:  04/16/2022 FINDINGS: POSTERIOR  CIRCULATION: --Vertebral arteries: Normal --Inferior cerebellar arteries: Normal. --Basilar artery: Normal. --Superior cerebellar arteries: Normal. --Posterior cerebral arteries: Normal.  Left P-comm is patent. ANTERIOR CIRCULATION: --Intracranial internal carotid arteries: Chronic occlusion of the left ICA. Normal right ICA. --Anterior cerebral arteries (ACA): Normal. --Middle cerebral arteries (MCA): Normal. IMPRESSION: Chronic occlusion of the left ICA.  No acute finding. Electronically Signed   By: Deatra Robinson M.D.   On: 06/22/2023 22:18        Scheduled Meds:  aspirin  81 mg Oral  Daily   atorvastatin  40 mg Oral Daily   Chlorhexidine Gluconate Cloth  6 each Topical Daily   fiber supplement (BANATROL TF)  60 mL Oral TID   Gerhardt's butt cream   Topical QID   heparin injection (subcutaneous)  5,000 Units Subcutaneous Q8H   levETIRAcetam  500 mg Oral BID   lipase/protease/amylase  24,000 Units Oral TID AC   melatonin  3 mg Oral QHS   metoprolol tartrate  100 mg Oral BID   mirtazapine  15 mg Oral QHS   mouth rinse  15 mL Mouth Rinse 4 times per day   pantoprazole  40 mg Oral Daily   ticagrelor  90 mg Oral BID   Vivonex RTF  1,000 mL Per Tube Q24H   Continuous Infusions:  sodium chloride Stopped (06/13/23 0636)     LOS: 20 days        Huey Bienenstock, MD [facility]   To contact the attending provider between 7A-7P or the covering provider during after hours 7P-7A, please log into the web site [email or web address] and access using universal [place] password for that web site. If you do not have the password, please call the hospital operator.  06/24/2023, 1:53 PM

## 2023-06-24 NOTE — Progress Notes (Signed)
Occupational Therapy Treatment Patient Details Name: [name] MRN: [id number] DOB: [birth year] Today's Date: 06/24/2023   History of present illness Pt is 77 year old presented to [facility] on  06/04/23 for acute respiratory failure due to PNA. Intubatd on admission. Extubated 8/28. Pt encephaloathic post extubation. 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   OT comments  Patient received in supine and agreeable to OT session and transfer to recliner with encouragement. Patient was able to get to EOB with min assist and stood from EOB x2 for peri area bathing due to soiled bed. Patient required cues for hand placement and safety with transfer to recliner and min assist. Patient performed grooming tasks seated on recliner and asked to return to bed due to bottom pain from sitting. Patient returned to supine and was setup for breakfast. Patient will benefit from continued inpatient follow up therapy, <3 hours/day to continue to address bathing, dressing, and toilet transfers. Acute OT to continue to follow.       If plan is discharge home, recommend the following:  A lot of help with bathing/dressing/bathroom;Assistance with cooking/housework;Direct supervision/assist for medications management;Direct supervision/assist for financial management;Assist for transportation;Help with stairs or ramp for entrance;A lot of help with walking and/or transfers   Equipment Recommendations  Other (comment) (TBD next venue)    Recommendations for Other Services      Precautions / Restrictions Precautions Precautions: Fall Precaution Comments: Cortrak, RUE painful for BP cuff Restrictions Weight Bearing Restrictions: No       Mobility Bed Mobility Overal bed mobility: Needs Assistance Bed Mobility: Supine to  Sit, Sit to Supine     Supine to sit: Min assist, HOB elevated, Used rails Sit to supine: Mod assist   General bed mobility comments: assistance with BLEs to return to supine    Transfers Overall transfer level: Needs assistance Equipment used: Rolling walker (2 wheels) Transfers: Sit to/from Stand, Bed to chair/wheelchair/BSC Sit to Stand: Min assist     Step pivot transfers: Min assist     General transfer comment: able to perform transfer to recliner with cues for safety and hand placement     Balance Overall balance assessment: Needs assistance Sitting-balance support: No upper extremity supported, Single extremity supported Sitting balance-Leahy Scale: Fair Sitting balance - Comments: EOB   Standing balance support: Bilateral upper extremity supported Standing balance-Leahy Scale: Poor Standing balance comment: reliant on RW for support                           ADL either performed or assessed with clinical judgement   ADL Overall ADL's : Needs assistance/impaired Eating/Feeding: Set up   Grooming: Supervision/safety;Sitting Grooming Details (indicate cue type and reason): in recliner     Lower Body Bathing: Moderate assistance;Sit to/from stand Lower Body Bathing Details (indicate cue type and reason): stood for peri area bathing due to soilded bed     Lower Body Dressing: Moderate assistance;Sit to/from stand   Toilet Transfer: Minimal assistance;Rolling walker (2 wheels) Toilet Transfer Details (indicate cue type and reason): simuilated to recliner, declined transfer to [facility] Toileting- Clothing Manipulation and Hygiene: Total assistance              Extremity/Trunk Assessment              Vision  Perception     Praxis      Cognition Arousal: Alert Behavior During Therapy: WFL for tasks assessed/performed Overall Cognitive Status: Impaired/Different from baseline Area of Impairment: Attention, Memory, Following  commands, Safety/judgement, Awareness, Problem solving                   Current Attention Level: Sustained Memory: Decreased recall of precautions, Decreased short-term memory Following Commands: Follows one step commands consistently Safety/Judgement: Decreased awareness of safety, Decreased awareness of deficits Awareness: Emergent Problem Solving: Slow processing, Requires verbal cues General Comments: required encouragement to participate and did not want to stay in recliner after session        Exercises      Shoulder Instructions       General Comments SpO2 96% on RA    Pertinent Vitals/ Pain       Pain Assessment Pain Assessment: Faces Faces Pain Scale: Hurts even more Pain Location: bottom Pain Descriptors / Indicators: Sore, Grimacing Pain Intervention(s): Limited activity within patient's tolerance, Monitored during session, Repositioned  Home Living                                          Prior Functioning/Environment              Frequency  Min 2X/week        Progress Toward Goals  OT Goals(current goals can now be found in the care plan section)  Progress towards OT goals: Progressing toward goals  Acute Rehab OT Goals Patient Stated Goal: get better OT Goal Formulation: With patient Time For Goal Achievement: 06/30/23 Potential to Achieve Goals: Good ADL Goals Pt Will Perform Grooming: with supervision;standing Pt Will Perform Lower Body Bathing: with modified independence;sit to/from stand Pt Will Perform Lower Body Dressing: sit to/from stand;with supervision Pt Will Transfer to Toilet: with contact guard assist;ambulating;regular height toilet Pt Will Perform Toileting - Clothing Manipulation and hygiene: with modified independence;sit to/from stand Pt Will Perform Tub/Shower Transfer: Tub transfer;with supervision;shower seat;ambulating;rolling walker Additional ADL Goal #1: Pt will follow 3 step commands with  increased time.  Plan Discharge plan remains appropriate    Co-evaluation                 AM-PAC OT "6 Clicks" Daily Activity     Outcome Measure   Help from another person eating meals?: A Little Help from another person taking care of personal grooming?: A Little Help from another person toileting, which includes using toliet, bedpan, or urinal?: A Lot Help from another person bathing (including washing, rinsing, drying)?: A Lot Help from another person to put on and taking off regular upper body clothing?: A Little Help from another person to put on and taking off regular lower body clothing?: A Lot 6 Click Score: 15    End of Session Equipment Utilized During Treatment: Gait belt;Rolling walker (2 wheels)  OT Visit Diagnosis: Unsteadiness on feet (R26.81);Muscle weakness (generalized) (M62.81);Other symptoms and signs involving cognitive function   Activity Tolerance Patient tolerated treatment well   Patient Left in bed;with call bell/phone within reach;with bed alarm set   Nurse Communication Mobility status        Time: 7846-9629 OT Time Calculation (min): 29 min  Charges: OT General Charges $OT Visit: 1 Visit OT Treatments $Self Care/Home Management : 23-37 mins  Alfonse Flavors, OTA Acute Rehabilitation Services  Office [phone]  Christin Mccreedy Jeannett Senior 06/24/2023, 10:00 AM

## 2023-06-25 ENCOUNTER — Ambulatory Visit: Payer: Medicare HMO | Attending: Physician Assistant | Admitting: Physician Assistant

## 2023-06-25 DIAGNOSIS — I10 Essential (primary) hypertension: Secondary | ICD-10-CM | POA: Diagnosis not present

## 2023-06-25 DIAGNOSIS — R1312 Dysphagia, oropharyngeal phase: Secondary | ICD-10-CM | POA: Diagnosis not present

## 2023-06-25 DIAGNOSIS — F32A Depression, unspecified: Secondary | ICD-10-CM

## 2023-06-25 DIAGNOSIS — J9601 Acute respiratory failure with hypoxia: Secondary | ICD-10-CM | POA: Diagnosis not present

## 2023-06-25 LAB — BPAM RBC
Blood Product Expiration Date: 202410052359
Blood Product Expiration Date: 202410062359
ISSUE DATE / TIME: 202409101252
ISSUE DATE / TIME: 202409101754
Unit Type and Rh: 6200
Unit Type and Rh: 6200

## 2023-06-25 LAB — TYPE AND SCREEN
ABO/RH(D): A POS
Antibody Screen: NEGATIVE
Unit division: 0
Unit division: 0

## 2023-06-25 LAB — GLUCOSE, CAPILLARY
Glucose-Capillary: 131 mg/dL — ABNORMAL HIGH (ref 70–99)
Glucose-Capillary: 144 mg/dL — ABNORMAL HIGH (ref 70–99)
Glucose-Capillary: 86 mg/dL (ref 70–99)
Glucose-Capillary: 91 mg/dL (ref 70–99)
Glucose-Capillary: 92 mg/dL (ref 70–99)

## 2023-06-25 LAB — CBC
HCT: 31.8 % — ABNORMAL LOW (ref 36.0–46.0)
Hemoglobin: 10.5 g/dL — ABNORMAL LOW (ref 12.0–15.0)
MCH: 29.8 pg (ref 26.0–34.0)
MCHC: 33 g/dL (ref 30.0–36.0)
MCV: 90.3 fL (ref 80.0–100.0)
Platelets: 247 10*3/uL (ref 150–400)
RBC: 3.52 MIL/uL — ABNORMAL LOW (ref 3.87–5.11)
RDW: 16.6 % — ABNORMAL HIGH (ref 11.5–15.5)
WBC: 8.8 10*3/uL (ref 4.0–10.5)
nRBC: 0 % (ref 0.0–0.2)

## 2023-06-25 NOTE — Progress Notes (Addendum)
PROGRESS NOTE        PATIENT DETAILS Name: [name] Age: 77 y.o. Sex: female Date of Birth: Feb 18, 1946 Admit Date: 06/04/2023 Admitting Physician Steffanie Dunn, DO RUE:[id number], Barry Dienes, MD  Brief Summary: Patient is a 77 y.o.  female with history of CVA, seizure, HTN, CAD s/p PCI May 2024, CKD 4, seizure disorder-presented to the ED on 8/21 with shortness of breath- found to have severe hypoxia-initially required BiPAP but subsequently intubated-admitted to the ICU-stabilized-extubated on 8/28-subsequently developed severe encephalopathy, found to have CVA, oropharyngeal dysphagia requiring NG tube feeds.  See below for further details.  Significant events: 8/21>> admitted, intubated 8/28>>extubated 9/02>> transferred to [facility] 9/07>> MRI significant for subacute CVA  Significant studies: 8/21>> CXR: Multilobar right-sided PNA 8/21>> CT chest: Large airspace opacity involving right upper/lower lobes 8/30>> Echo: EF 60-65% 9/07>> MRI brain: Subacute infarct left parietal lobe 9/08>> MRI brain: Chronic occlusion left ICA 9/09>> carotid Doppler: Known chronic left ICA occlusion,<50% right carotid. 9/09>> LDL: 27 9/09>> A1c: 5.9  Significant microbiology data: 8/21>> COVID/influenza/RSV PCR: Negative 8/21>> respiratory virus panel: Negative 8/21>> tracheal aspirate: Negative 8/21>> blood culture: Negative 9/03>> C. difficile studies: Negative  Procedures: 8/23>>Cortrak  Consults: PCCM Neuro  Subjective: Lying comfortably in bed-denies any chest pain or shortness of breath.  No family at bedside-claims she ate some dinner yesterday.  Objective: Vitals: Blood pressure 139/60, pulse 85, temperature 97.7 F (36.5 C), temperature source Axillary, resp. rate 20, height 5\' 3"  (1.6 m), weight 82.5 kg, SpO2 92%.   Exam: Gen Exam:Alert awake-not in any distress HEENT:atraumatic, normocephalic Chest: B/L clear to auscultation anteriorly CVS:S1S2  regular Abdomen:soft non tender, non distended Extremities:no edema Neurology: Non focal-generalized weakness Skin: no rash  Pertinent Labs/Radiology:    Latest Ref Rng & Units 06/25/2023    6:27 AM 06/24/2023    3:42 AM 06/23/2023    5:57 AM  CBC  WBC 4.0 - 10.5 K/uL 8.8  6.4  6.1   Hemoglobin 12.0 - 15.0 g/dL 91.4  7.2  7.5   Hematocrit 36.0 - 46.0 % 31.8  23.3  23.7   Platelets 150 - 400 K/uL 247  254  257     Lab Results  Component Value Date   NA 136 06/23/2023   K 4.1 06/23/2023   CL 108 06/23/2023   CO2 21 (L) 06/23/2023      Assessment/Plan: Severe sepsis secondary to pneumonia (POA) Acute hypoxic respiratory failure Overall improved Completed a course of antibiotics Supportive care  Acute metabolic encephalopathy Slowly improving Supportive care Delirium precautions  AKI on CKD 4 AKI likely hemodynamically mediated Creatinine slowly improving and close to baseline Follow electrolytes periodically  Normocytic anemia Due to critical illness/CKD/significant ecchymosis/bruising No evidence of GI bleeding S/p 4 units of PRBC-last on 9/10 Follow CBC closely.  CAD s/p PCI May 2024 On aspirin/Brilinta/statin/beta-blocker Thankfully no chest pain/anginal symptoms  Subacute CVA Workup as above Neurology recommending aspirin/Brilinta (as patient had CVA on aspirin/Plavix)  HTN BP stable Continue metoprolol Amlodipine/Imdur/hydralazine on hold-slowly resume when able  Seizure disorder Keppra  GERD PPI  Mood disorder/depression Home meds on hold-allow mentation to improve further before resuming.  Oropharyngeal dysphagia Continue to acute illness/debility/deconditioning NG feedings in place-nocturnal feedings only SLP following-on dysphagia 3 diet. Encourage oral intake Per prior note-IR PEG tube not possible due to ventral hernia-due to cardiac issues-aspirin/Brilinta-difficult for surgical  PEG.  Thankfully mentation slowly improving-diet being  introduced.  11 mm right thyroid nodule Per radiology not clinically significant-no follow-up imaging recommended  Nutrition Status: Nutrition Problem: Inadequate oral intake Etiology: inability to eat Signs/Symptoms: NPO status Interventions: Hormel Shake, Tube feeding, Other (Comment), Calorie Count (banatrol)  BMI: Estimated body mass index is 32.22 kg/m as calculated from the following:   Height as of this encounter: 5\' 3"  (1.6 m).   Weight as of this encounter: 82.5 kg.   Code status:   Code Status: Full Code   DVT Prophylaxis: heparin injection 5,000 Units Start: 06/20/23 1800 Place and maintain sequential compression device Start: 06/18/23 1419 SCDs Start: 06/04/23 0753   Family Communication: None at bedside   Disposition Plan: Status is: Inpatient Remains inpatient appropriate because: Severity of illness   Planned Discharge Destination:SNF   Diet: Diet Order             DIET DYS 3 Room service appropriate? Yes with Assist; Fluid consistency: Nectar Thick  Diet effective now                     Antimicrobial agents: Anti-infectives (From admission, onward)    Start     Dose/Rate Route Frequency Ordered Stop   06/04/23 0830  cefTRIAXone (ROCEPHIN) 2 g in sodium chloride 0.9 % 100 mL IVPB        2 g 200 mL/hr over 30 Minutes Intravenous Every 24 hours 06/04/23 0829 06/10/23 0935   06/04/23 0800  doxycycline (VIBRAMYCIN) 100 mg in sodium chloride 0.9 % 250 mL IVPB  Status:  Discontinued        100 mg 125 mL/hr over 120 Minutes Intravenous Every 12 hours 06/04/23 0735 06/05/23 1000   06/04/23 0645  vancomycin (VANCOCIN) IVPB 1000 mg/200 mL premix  Status:  Discontinued        1,000 mg 200 mL/hr over 60 Minutes Intravenous  Once 06/04/23 0630 06/04/23 0631   06/04/23 0645  ceFEPIme (MAXIPIME) 2 g in sodium chloride 0.9 % 100 mL IVPB        2 g 200 mL/hr over 30 Minutes Intravenous  Once 06/04/23 0630 06/04/23 0729   06/04/23 0645  vancomycin  (VANCOREADY) IVPB 1250 mg/250 mL        1,250 mg 166.7 mL/hr over 90 Minutes Intravenous  Once 06/04/23 0631 06/04/23 0949        MEDICATIONS: Scheduled Meds:  aspirin  81 mg Oral Daily   atorvastatin  40 mg Oral Daily   Chlorhexidine Gluconate Cloth  6 each Topical Daily   fiber supplement (BANATROL TF)  60 mL Oral TID   Gerhardt's butt cream   Topical QID   heparin injection (subcutaneous)  5,000 Units Subcutaneous Q8H   levETIRAcetam  500 mg Oral BID   lipase/protease/amylase  24,000 Units Oral TID AC   melatonin  3 mg Oral QHS   metoprolol tartrate  100 mg Oral BID   mirtazapine  15 mg Oral QHS   mouth rinse  15 mL Mouth Rinse 4 times per day   pantoprazole  40 mg Oral Daily   ticagrelor  90 mg Oral BID   Vivonex RTF  1,000 mL Per Tube Q24H   Continuous Infusions:  sodium chloride Stopped (06/13/23 0636)   PRN Meds:.acetaminophen, docusate, hydrALAZINE, ipratropium-albuterol, loperamide, metoprolol tartrate, ondansetron (ZOFRAN) IV, mouth rinse, polyethylene glycol, senna-docusate, traZODone   I have personally reviewed following labs and imaging studies  LABORATORY DATA: CBC: Recent Labs  Lab 06/21/23  1610 06/22/23 0807 06/23/23 0557 06/24/23 0342 06/25/23 0627  WBC 8.1 7.4 6.1 6.4 8.8  HGB 7.7* 8.2* 7.5* 7.2* 10.5*  HCT 24.4* 25.8* 23.7* 23.3* 31.8*  MCV 97.6 93.8 96.7 95.5 90.3  PLT 263 289 257 254 247    Basic Metabolic Panel: Recent Labs  Lab 06/19/23 0300 06/20/23 0312 06/21/23 0859 06/22/23 0807 06/23/23 0557  NA 141 138 139 137 136  K 4.1 4.3 4.2 4.3 4.1  CL 113* 109 112* 109 108  CO2 19* 18* 19* 20* 21*  GLUCOSE 174* 150* 147* 122* 186*  BUN 61* 58* 53* 50* 45*  CREATININE 1.45* 1.52* 1.59* 1.57* 1.48*  CALCIUM 8.8* 8.6* 8.3* 8.5* 8.5*  MG 2.0 1.9 2.0 1.9 1.8    GFR: Estimated Creatinine Clearance: 32.9 mL/min (A) (by C-G formula based on SCr of 1.48 mg/dL (H)).  Liver Function Tests: No results for input(s): "AST", "ALT",  "ALKPHOS", "BILITOT", "PROT", "ALBUMIN" in the last 168 hours. No results for input(s): "LIPASE", "AMYLASE" in the last 168 hours. No results for input(s): "AMMONIA" in the last 168 hours.  Coagulation Profile: No results for input(s): "INR", "PROTIME" in the last 168 hours.  Cardiac Enzymes: No results for input(s): "CKTOTAL", "CKMB", "CKMBINDEX", "TROPONINI" in the last 168 hours.  BNP (last 3 results) No results for input(s): "PROBNP" in the last 8760 hours.  Lipid Profile: Recent Labs    06/23/23 0557  CHOL 72  HDL 32*  LDLCALC 27  TRIG 64  CHOLHDL 2.3    Thyroid Function Tests: No results for input(s): "TSH", "T4TOTAL", "FREET4", "T3FREE", "THYROIDAB" in the last 72 hours.  Anemia Panel: No results for input(s): "VITAMINB12", "FOLATE", "FERRITIN", "TIBC", "IRON", "RETICCTPCT" in the last 72 hours.  Urine analysis:    Component Value Date/Time   COLORURINE AMBER (A) 04/15/2023 2010   APPEARANCEUR CLOUDY (A) 04/15/2023 2010   LABSPEC 1.017 04/15/2023 2010   PHURINE 5.0 04/15/2023 2010   GLUCOSEU NEGATIVE 04/15/2023 2010   HGBUR LARGE (A) 04/15/2023 2010   BILIRUBINUR NEGATIVE 04/15/2023 2010   KETONESUR NEGATIVE 04/15/2023 2010   PROTEINUR 100 (A) 04/15/2023 2010   NITRITE NEGATIVE 04/15/2023 2010   LEUKOCYTESUR LARGE (A) 04/15/2023 2010    Sepsis Labs: Lactic Acid, Venous    Component Value Date/Time   LATICACIDVEN 1.3 06/05/2023 1650    MICROBIOLOGY: Recent Results (from the past 240 hour(s))  C Difficile Quick Screen (NO PCR Reflex)     Status: None   Collection Time: 06/17/23  4:52 AM   Specimen: STOOL  Result Value Ref Range Status   C Diff antigen NEGATIVE NEGATIVE Final   C Diff toxin NEGATIVE NEGATIVE Final   C Diff interpretation No C. difficile detected.  Final    Comment: Performed at [facility], [address], [place], [place] [address]    RADIOLOGY STUDIES/RESULTS: EEG adult  Result Date: 06/23/2023 Charlsie Quest, MD      06/23/2023  4:00 PM Patient Name: [name] MRN: [id number] Epilepsy Attending: Charlsie Quest Referring Physician/Provider: Starleen Arms, MD Date: 06/23/2023 Duration: 23.23 mins Patient history: 77yo F with left parietal stroke getting eeg to evaluate for seizure Level of alertness: Awake AEDs during EEG study: LEV Technical aspects: This EEG study was done with scalp electrodes positioned according to the 10-20 International system of electrode placement. Electrical activity was reviewed with band pass filter of 1-70Hz , sensitivity of 7 uV/mm, display speed of 62mm/sec with a 60Hz  notched filter applied as appropriate. EEG  data were recorded continuously and digitally stored.  Video monitoring was available and reviewed as appropriate. Description: The posterior dominant rhythm consists of 7.5 Hz activity of moderate voltage (25-35 uV) seen predominantly in posterior head regions, symmetric and reactive to eye opening and eye closing. EEG showed continuous generalized 6 to 7 Hz theta slowing. Physiologic photic driving was not seen during photic stimulation.  Hyperventilation was not performed.   ABNORMALITY - Continuous slow, generalized IMPRESSION: This study is suggestive of mild to moderate diffuse encephalopathy. No seizures or epileptiform discharges were seen throughout the recording. Charlsie Quest     LOS: 21 days   Jeoffrey Massed, MD  [facility]    To contact the attending provider between 7A-7P or the covering provider during after hours 7P-7A, please log into the web site [email or web address] and access using universal [place] password for that web site. If you do not have the password, please call the hospital operator.  06/25/2023, 11:35 AM

## 2023-06-25 NOTE — Progress Notes (Signed)
Calorie Count Note  48 hour calorie count ordered.  Patient was sleeping at time of rounds. She was discussed during rounds today and MD would like for more ONS to be ordered for patient to consume po since her po intake and need for Cortrak is the main barrier to discharge. Patient is currently on nectar thick liquids, so mightyshakes (already ordered) and magic cups (will order today) are the only appropriate ONS at this time. RD hung calorie count envelope on door today for improved data collection.   Diet: Dysphagia 1 (pureed)  Supplements: Mighty Shake TID with meals, each supplement provides 330 kcals and 9 grams of protein  Vivonex RTF @40  ml/hr   Breakfast: 25% Lunch: 25% Dinner: 0% Supplements: lack of documentation   Total intake: 490 kcal (30% of minimum estimated needs)  15 g protein (17% of minimum estimated needs)  Nutrition Dx:  Inadequate oral intake related to inability to eat as evidenced by NPO status.   Goal:  Patient will meet greater than or equal to 90% of their needs   Intervention: Continue nocturnal tube feeds via Cortrak: - Vivonex RTF @ 40 ml/hr x 12 hours from 1800 to 0600 (total of 480 ml nightly)   Nocturnal tube feeding regimen provides 480 kcal, 24 grams of protein, and 408 ml of H2O (meets 29% of minimum kcal needs and 28% of minimum protein needs).   - Continue Banatrol TF TID per tube   - Continue Mighty Shake TID with meals, each supplement provides 330 kcals and 9 grams of protein   - Encourage PO intake    - Added to feeder list    -MD ordering appetite stimulant   Leodis Rains, RDN, LDN  Clinical Nutrition

## 2023-06-25 NOTE — Plan of Care (Signed)
Pt has rested quietly throughout the night with no distress noted. Oriented to person and sometimes place. On room air. Cortrack intact with TF on pump. Purwick intact to suction. This am pt refused for [place] to get her blood sugar. She had a large dark brown loose BM. She did let us clean her up after much coaxing. Pt stating she just wants to be left alone.     Problem: Education: Goal: Knowledge of General Education information will improve Description: Including pain rating scale, medication(s)/side effects and non-pharmacologic comfort measures Outcome: Progressing   Problem: Health Behavior/Discharge Planning: Goal: Ability to manage health-related needs will improve Outcome: Progressing   Problem: Clinical Measurements: Goal: Ability to maintain clinical measurements within normal limits will improve Outcome: Progressing Goal: Will remain free from infection Outcome: Progressing Goal: Diagnostic test results will improve Outcome: Progressing Goal: Respiratory complications will improve Outcome: Progressing Goal: Cardiovascular complication will be avoided Outcome: Progressing   Problem: Activity: Goal: Risk for activity intolerance will decrease Outcome: Progressing   Problem: Nutrition: Goal: Adequate nutrition will be maintained Outcome: Progressing   Problem: Coping: Goal: Level of anxiety will decrease Outcome: Progressing   Problem: Elimination: Goal: Will not experience complications related to bowel motility Outcome: Progressing Goal: Will not experience complications related to urinary retention Outcome: Progressing   Problem: Pain Managment: Goal: General experience of comfort will improve Outcome: Progressing   Problem: Safety: Goal: Ability to remain free from injury will improve Outcome: Progressing   Problem: Skin Integrity: Goal: Risk for impaired skin integrity will decrease Outcome: Progressing   Problem: Education: Goal: Ability to describe  self-care measures that may prevent or decrease complications (Diabetes Survival Skills Education) will improve Outcome: Progressing Goal: Individualized Educational Video(s) Outcome: Progressing   Problem: Coping: Goal: Ability to adjust to condition or change in health will improve Outcome: Progressing   Problem: Fluid Volume: Goal: Ability to maintain a balanced intake and output will improve Outcome: Progressing   Problem: Health Behavior/Discharge Planning: Goal: Ability to identify and utilize available resources and services will improve Outcome: Progressing Goal: Ability to manage health-related needs will improve Outcome: Progressing   Problem: Metabolic: Goal: Ability to maintain appropriate glucose levels will improve Outcome: Progressing   Problem: Nutritional: Goal: Maintenance of adequate nutrition will improve Outcome: Progressing Goal: Progress toward achieving an optimal weight will improve Outcome: Progressing   Problem: Skin Integrity: Goal: Risk for impaired skin integrity will decrease Outcome: Progressing   Problem: Tissue Perfusion: Goal: Adequacy of tissue perfusion will improve Outcome: Progressing   Problem: Education: Goal: Understanding of CV disease, CV risk reduction, and recovery process will improve Outcome: Progressing Goal: Individualized Educational Video(s) Outcome: Progressing   Problem: Activity: Goal: Ability to return to baseline activity level will improve Outcome: Progressing   Problem: Cardiovascular: Goal: Ability to achieve and maintain adequate cardiovascular perfusion will improve Outcome: Progressing Goal: Vascular access site(s) Level 0-1 will be maintained Outcome: Progressing   Problem: Health Behavior/Discharge Planning: Goal: Ability to safely manage health-related needs after discharge will improve Outcome: Progressing   Problem: Education: Goal: Knowledge of disease or condition will improve Outcome:  Progressing Goal: Knowledge of secondary prevention will improve (MUST DOCUMENT ALL) Outcome: Progressing Goal: Knowledge of patient specific risk factors will improve Loraine Leriche N/A or DELETE if not current risk factor) Outcome: Progressing   Problem: Ischemic Stroke/TIA Tissue Perfusion: Goal: Complications of ischemic stroke/TIA will be minimized Outcome: Progressing   Problem: Coping: Goal: Will verbalize positive feelings about self Outcome: Progressing Goal: Will identify  appropriate support needs Outcome: Progressing   Problem: Health Behavior/Discharge Planning: Goal: Ability to manage health-related needs will improve Outcome: Progressing Goal: Goals will be collaboratively established with patient/family Outcome: Progressing   Problem: Self-Care: Goal: Ability to participate in self-care as condition permits will improve Outcome: Progressing Goal: Verbalization of feelings and concerns over difficulty with self-care will improve Outcome: Progressing Goal: Ability to communicate needs accurately will improve Outcome: Progressing   Problem: Nutrition: Goal: Risk of aspiration will decrease Outcome: Progressing Goal: Dietary intake will improve Outcome: Progressing

## 2023-06-25 NOTE — Plan of Care (Signed)
  Problem: Health Behavior/Discharge Planning: Goal: Ability to manage health-related needs will improve Outcome: Progressing   Problem: Clinical Measurements: Goal: Ability to maintain clinical measurements within normal limits will improve Outcome: Progressing   Problem: Pain Managment: Goal: General experience of comfort will improve Outcome: Progressing   Problem: Metabolic: Goal: Ability to maintain appropriate glucose levels will improve Outcome: Progressing

## 2023-06-26 DIAGNOSIS — R1312 Dysphagia, oropharyngeal phase: Secondary | ICD-10-CM | POA: Diagnosis not present

## 2023-06-26 DIAGNOSIS — J9601 Acute respiratory failure with hypoxia: Secondary | ICD-10-CM | POA: Diagnosis not present

## 2023-06-26 DIAGNOSIS — F32A Depression, unspecified: Secondary | ICD-10-CM | POA: Diagnosis not present

## 2023-06-26 DIAGNOSIS — I10 Essential (primary) hypertension: Secondary | ICD-10-CM | POA: Diagnosis not present

## 2023-06-26 LAB — GLUCOSE, CAPILLARY
Glucose-Capillary: 112 mg/dL — ABNORMAL HIGH (ref 70–99)
Glucose-Capillary: 132 mg/dL — ABNORMAL HIGH (ref 70–99)
Glucose-Capillary: 155 mg/dL — ABNORMAL HIGH (ref 70–99)
Glucose-Capillary: 159 mg/dL — ABNORMAL HIGH (ref 70–99)

## 2023-06-26 LAB — CBC
HCT: 27.7 % — ABNORMAL LOW (ref 36.0–46.0)
Hemoglobin: 9 g/dL — ABNORMAL LOW (ref 12.0–15.0)
MCH: 30.4 pg (ref 26.0–34.0)
MCHC: 32.5 g/dL (ref 30.0–36.0)
MCV: 93.6 fL (ref 80.0–100.0)
Platelets: 204 10*3/uL (ref 150–400)
RBC: 2.96 MIL/uL — ABNORMAL LOW (ref 3.87–5.11)
RDW: 16.4 % — ABNORMAL HIGH (ref 11.5–15.5)
WBC: 7 10*3/uL (ref 4.0–10.5)
nRBC: 0 % (ref 0.0–0.2)

## 2023-06-26 MED ORDER — VIVONEX RTF PO LIQD
1000.0000 mL | ORAL | Status: DC
  Administered 2023-06-26 – 2023-06-28 (×3): 1000 mL
  Filled 2023-06-26 (×9): qty 1000

## 2023-06-26 MED ORDER — ORAL CARE MOUTH RINSE: 15.0000 mL | OROMUCOSAL | Status: DC | PRN

## 2023-06-26 MED ORDER — ENSURE ENLIVE PO LIQD
237.0000 mL | Freq: Three times a day (TID) | ORAL | Status: DC
  Administered 2023-06-26 – 2023-07-03 (×9): 237 mL via ORAL

## 2023-06-26 MED ORDER — HYDROMORPHONE HCL 1 MG/ML IJ SOLN
0.5000 mg | Freq: Once | INTRAMUSCULAR | Status: AC
  Administered 2023-06-26: 0.5 mg via INTRAVENOUS
  Filled 2023-06-26: qty 0.5

## 2023-06-26 NOTE — Plan of Care (Signed)
Pt has rested quietly throughout the night with no distress noted. Alert and oriented to person and place. On room air. SR. Purwick to suction. Had 3 large black green watery stools. Buttocks very red. Gerhart cream applied after cleaning. Pt has called out frequently tonight. Medicated for pain with relief noted. No other complaints voiced.     Problem: Education: Goal: Knowledge of General Education information will improve Description: Including pain rating scale, medication(s)/side effects and non-pharmacologic comfort measures Outcome: Progressing   Problem: Health Behavior/Discharge Planning: Goal: Ability to manage health-related needs will improve Outcome: Progressing   Problem: Clinical Measurements: Goal: Ability to maintain clinical measurements within normal limits will improve Outcome: Progressing Goal: Will remain free from infection Outcome: Progressing Goal: Diagnostic test results will improve Outcome: Progressing Goal: Respiratory complications will improve Outcome: Progressing Goal: Cardiovascular complication will be avoided Outcome: Progressing   Problem: Activity: Goal: Risk for activity intolerance will decrease Outcome: Progressing   Problem: Nutrition: Goal: Adequate nutrition will be maintained Outcome: Progressing   Problem: Coping: Goal: Level of anxiety will decrease Outcome: Progressing   Problem: Elimination: Goal: Will not experience complications related to bowel motility Outcome: Progressing Goal: Will not experience complications related to urinary retention Outcome: Progressing   Problem: Pain Managment: Goal: General experience of comfort will improve Outcome: Progressing   Problem: Safety: Goal: Ability to remain free from injury will improve Outcome: Progressing   Problem: Skin Integrity: Goal: Risk for impaired skin integrity will decrease Outcome: Progressing   Problem: Education: Goal: Ability to describe self-care  measures that may prevent or decrease complications (Diabetes Survival Skills Education) will improve Outcome: Progressing Goal: Individualized Educational Video(s) Outcome: Progressing   Problem: Coping: Goal: Ability to adjust to condition or change in health will improve Outcome: Progressing   Problem: Fluid Volume: Goal: Ability to maintain a balanced intake and output will improve Outcome: Progressing   Problem: Health Behavior/Discharge Planning: Goal: Ability to identify and utilize available resources and services will improve Outcome: Progressing Goal: Ability to manage health-related needs will improve Outcome: Progressing   Problem: Metabolic: Goal: Ability to maintain appropriate glucose levels will improve Outcome: Progressing   Problem: Nutritional: Goal: Maintenance of adequate nutrition will improve Outcome: Progressing Goal: Progress toward achieving an optimal weight will improve Outcome: Progressing   Problem: Skin Integrity: Goal: Risk for impaired skin integrity will decrease Outcome: Progressing   Problem: Tissue Perfusion: Goal: Adequacy of tissue perfusion will improve Outcome: Progressing   Problem: Education: Goal: Understanding of CV disease, CV risk reduction, and recovery process will improve Outcome: Progressing Goal: Individualized Educational Video(s) Outcome: Progressing   Problem: Activity: Goal: Ability to return to baseline activity level will improve Outcome: Progressing   Problem: Cardiovascular: Goal: Ability to achieve and maintain adequate cardiovascular perfusion will improve Outcome: Progressing Goal: Vascular access site(s) Level 0-1 will be maintained Outcome: Progressing   Problem: Health Behavior/Discharge Planning: Goal: Ability to safely manage health-related needs after discharge will improve Outcome: Progressing   Problem: Education: Goal: Knowledge of disease or condition will improve Outcome:  Progressing Goal: Knowledge of secondary prevention will improve (MUST DOCUMENT ALL) Outcome: Progressing Goal: Knowledge of patient specific risk factors will improve Loraine Leriche N/A or DELETE if not current risk factor) Outcome: Progressing   Problem: Ischemic Stroke/TIA Tissue Perfusion: Goal: Complications of ischemic stroke/TIA will be minimized Outcome: Progressing   Problem: Coping: Goal: Will verbalize positive feelings about self Outcome: Progressing Goal: Will identify appropriate support needs Outcome: Progressing   Problem: Health Behavior/Discharge Planning: Goal:  Ability to manage health-related needs will improve Outcome: Progressing Goal: Goals will be collaboratively established with patient/family Outcome: Progressing   Problem: Self-Care: Goal: Ability to participate in self-care as condition permits will improve Outcome: Progressing Goal: Verbalization of feelings and concerns over difficulty with self-care will improve Outcome: Progressing Goal: Ability to communicate needs accurately will improve Outcome: Progressing   Problem: Nutrition: Goal: Risk of aspiration will decrease Outcome: Progressing Goal: Dietary intake will improve Outcome: Progressing

## 2023-06-26 NOTE — Progress Notes (Signed)
Speech Language Pathology Treatment: Dysphagia  Patient Details Name: [name] MRN: [id number] DOB: [birth year] Today's Date: 06/26/2023 Time: 1343-1410 SLP Time Calculation (min) (ACUTE ONLY): 27 min  Assessment / Plan / Recommendation Clinical Impression  Pt with thin liquids at bedside and reminded nursing of nectar thick liquid and SLP thickened soda. Pt received magic cup from cafeteria while SLP present and she fed herself 100% and consumed approximately 2 oz soda (recorded in calorie count). There were no s/s aspiration but increased work of breathing and needing rest breaks. She used the EMST however it was limited because pt stated it made her "dizzy". She performed 2 sets of 5-6 repetitions. Pt has chronic dysphagia from 2021 and likely present now with current illness. At some point she will need a repeat MBS to determine continued need for thick liquids (possibly next week).    HPI HPI: Pt is a 77 yo female presenting 8/21 with shortness of breath, cough, hypoxia. ETT 8/21-8/28. Pt was seen by SLP most recently in July 2023, recommending regular solids and thin liquids but noting lingual abnormalities and reduced ROM secondary to lingual trauma). Esophagram (November 2021) showed penetration of thin liquids, sensed aspiration with thicker barium, mild esophageal dysmotility, and HH with mild narrowing of the GE junction.   FEES at outside hospital in 2019 [facility]. Imaging from [facility] in 2019 reviewed (report not available) with transient penetration and vallecular residue noted. PMH includes: GERD, stroke, recurrent PNA, seizures, sleep apnea, DM, HTN, DVT, B12 deficiency, CAD, colostomy, chronic pancreatitis      SLP Plan  Continue with current plan of care      Recommendations for follow up therapy are one component of a multi-disciplinary discharge planning process, led by the attending physician.  Recommendations may be updated based on patient status, additional functional criteria  and insurance authorization.    Recommendations  Diet recommendations: Dysphagia 3 (mechanical soft);Nectar-thick liquid Liquids provided via: Cup;Straw Medication Administration: Whole meds with puree Supervision: Patient able to self feed (needs encouragement to eat) Compensations: Slow rate;Small sips/bites Postural Changes and/or Swallow Maneuvers: Seated upright 90 degrees;Upright 30-60 min after meal                  Oral care BID   Frequent or constant Supervision/Assistance Dysphagia, unspecified (R13.10)     Continue with current plan of care     Royce Macadamia  06/26/2023, 2:26 PM

## 2023-06-26 NOTE — Progress Notes (Signed)
Pt refused CBG check for 1600. Educated on the need for the CBG,but Pt refused still. MD Made aware.

## 2023-06-26 NOTE — Progress Notes (Signed)
PROGRESS NOTE        PATIENT DETAILS Name: [name] Age: 77 y.o. Sex: female Date of Birth: [birth year] Admit Date: 06/04/2023 Admitting Physician Steffanie Dunn, DO UJW:[id number], Barry Dienes, MD  Brief Summary: Patient is a 77 y.o.  female with history of CVA, seizure, HTN, CAD s/p PCI May 2024, CKD 4, seizure disorder-presented to the ED on 8/21 with shortness of breath- found to have severe hypoxia-initially required BiPAP but subsequently intubated-admitted to the ICU-stabilized-extubated on 8/28-subsequently developed severe encephalopathy, found to have CVA, oropharyngeal dysphagia requiring NG tube feeds.  See below for further details.  Significant events: 8/21>> admitted, intubated 8/28>>extubated 9/02>> transferred to [facility] 9/07>> MRI significant for subacute CVA  Significant studies: 8/21>> CXR: Multilobar right-sided PNA 8/21>> CT chest: Large airspace opacity involving right upper/lower lobes 8/30>> Echo: EF 60-65% 9/07>> MRI brain: Subacute infarct left parietal lobe 9/08>> MRI brain: Chronic occlusion left ICA 9/09>> carotid Doppler: Known chronic left ICA occlusion,<50% right carotid. 9/09>> LDL: 27 9/09>> A1c: 5.9  Significant microbiology data: 8/21>> COVID/influenza/RSV PCR: Negative 8/21>> respiratory virus panel: Negative 8/21>> tracheal aspirate: Negative 8/21>> blood culture: Negative 9/03>> C. difficile studies: Negative  Procedures: 8/23>>Cortrak  Consults: PCCM Neuro  Subjective: Claims her diet is slowly improving-she apparently had quite a bit of pudding/ice cream last night.  No family at bedside.  Objective: Vitals: Blood pressure (!) 151/54, pulse 69, temperature 98.6 F (37 C), temperature source Oral, resp. rate 12, height 5\' 3"  (1.6 m), weight 82.1 kg, SpO2 92%.   Exam: Gen Exam:Alert awake-not in any distress HEENT:atraumatic, normocephalic Chest: B/L clear to auscultation anteriorly CVS:S1S2  regular Abdomen:soft non tender, non distended Extremities:no edema Neurology: Non focal Skin: no rash  Pertinent Labs/Radiology:    Latest Ref Rng & Units 06/26/2023    3:22 AM 06/25/2023    6:27 AM 06/24/2023    3:42 AM  CBC  WBC 4.0 - 10.5 K/uL 7.0  8.8  6.4   Hemoglobin 12.0 - 15.0 g/dL 9.0  95.6  7.2   Hematocrit 36.0 - 46.0 % 27.7  31.8  23.3   Platelets 150 - 400 K/uL 204  247  254     Lab Results  Component Value Date   NA 136 06/23/2023   K 4.1 06/23/2023   CL 108 06/23/2023   CO2 21 (L) 06/23/2023      Assessment/Plan: Severe sepsis secondary to pneumonia (POA) Acute hypoxic respiratory failure Overall improved Completed a course of antibiotics Supportive care  Acute metabolic encephalopathy Slowly improving Supportive care Delirium precautions  AKI on CKD 4 AKI likely hemodynamically mediated Creatinine slowly improving and close to baseline Follow electrolytes periodically  Normocytic anemia Due to critical illness/CKD/significant ecchymosis/bruising No evidence of GI bleeding S/p 4 units of PRBC so far-last on 9/10 Follow CBC closely.  CAD s/p PCI May 2024 On aspirin/Brilinta/statin/beta-blocker Thankfully no chest pain/anginal symptoms  Subacute CVA Workup as above Neurology recommending aspirin/Brilinta (as patient had CVA on aspirin/Plavix)  HTN BP stable Continue metoprolol Amlodipine/Imdur/hydralazine on hold-slowly resume when able  Seizure disorder Keppra  GERD PPI  Mood disorder/depression Home meds on hold-allow mentation to improve further before resuming.  Oropharyngeal dysphagia Continue to acute illness/debility/deconditioning NG feedings in place-nocturnal feedings only SLP following-on dysphagia 3 diet. Encourage oral intake Per prior note-IR PEG tube not possible due to ventral hernia-due to cardiac issues-aspirin/Brilinta-difficult  for surgical PEG.  Thankfully mentation slowly improving-diet being  introduced.  11 mm right thyroid nodule Per radiology not clinically significant-no follow-up imaging recommended  Nutrition Status: Nutrition Problem: Inadequate oral intake Etiology: inability to eat Signs/Symptoms: NPO status Interventions: Hormel Shake, Tube feeding, Other (Comment), Calorie Count (banatrol)  BMI: Estimated body mass index is 32.06 kg/m as calculated from the following:   Height as of this encounter: 5\' 3"  (1.6 m).   Weight as of this encounter: 82.1 kg.   Code status:   Code Status: Full Code   DVT Prophylaxis: heparin injection 5,000 Units Start: 06/20/23 1800 Place and maintain sequential compression device Start: 06/18/23 1419 SCDs Start: 06/04/23 0753   Family Communication: Cynthia Zingarelli [phone] left VM 9/12    Disposition Plan: Status is: Inpatient Remains inpatient appropriate because: Severity of illness   Planned Discharge Destination:SNF   Diet: Diet Order             DIET DYS 3 Room service appropriate? Yes with Assist; Fluid consistency: Nectar Thick  Diet effective now                     Antimicrobial agents: Anti-infectives (From admission, onward)    Start     Dose/Rate Route Frequency Ordered Stop   06/04/23 0830  cefTRIAXone (ROCEPHIN) 2 g in sodium chloride 0.9 % 100 mL IVPB        2 g 200 mL/hr over 30 Minutes Intravenous Every 24 hours 06/04/23 0829 06/10/23 0935   06/04/23 0800  doxycycline (VIBRAMYCIN) 100 mg in sodium chloride 0.9 % 250 mL IVPB  Status:  Discontinued        100 mg 125 mL/hr over 120 Minutes Intravenous Every 12 hours 06/04/23 0735 06/05/23 1000   06/04/23 0645  vancomycin (VANCOCIN) IVPB 1000 mg/200 mL premix  Status:  Discontinued        1,000 mg 200 mL/hr over 60 Minutes Intravenous  Once 06/04/23 0630 06/04/23 0631   06/04/23 0645  ceFEPIme (MAXIPIME) 2 g in sodium chloride 0.9 % 100 mL IVPB        2 g 200 mL/hr over 30 Minutes Intravenous  Once 06/04/23 0630 06/04/23 0729    06/04/23 0645  vancomycin (VANCOREADY) IVPB 1250 mg/250 mL        1,250 mg 166.7 mL/hr over 90 Minutes Intravenous  Once 06/04/23 0631 06/04/23 0949        MEDICATIONS: Scheduled Meds:  aspirin  81 mg Oral Daily   atorvastatin  40 mg Oral Daily   Chlorhexidine Gluconate Cloth  6 each Topical Daily   fiber supplement (BANATROL TF)  60 mL Oral TID   Gerhardt's butt cream   Topical QID   heparin injection (subcutaneous)  5,000 Units Subcutaneous Q8H   levETIRAcetam  500 mg Oral BID   lipase/protease/amylase  24,000 Units Oral TID AC   melatonin  3 mg Oral QHS   metoprolol tartrate  100 mg Oral BID   mirtazapine  15 mg Oral QHS   mouth rinse  15 mL Mouth Rinse 4 times per day   pantoprazole  40 mg Oral Daily   ticagrelor  90 mg Oral BID   Vivonex RTF  1,000 mL Per Tube Q24H   Continuous Infusions:  sodium chloride Stopped (06/13/23 0636)   PRN Meds:.acetaminophen, docusate, hydrALAZINE, ipratropium-albuterol, loperamide, metoprolol tartrate, ondansetron (ZOFRAN) IV, mouth rinse, polyethylene glycol, senna-docusate, traZODone   I have personally reviewed following labs and imaging studies  LABORATORY  DATA: CBC: Recent Labs  Lab 06/22/23 0807 06/23/23 0557 06/24/23 0342 06/25/23 0627 06/26/23 0322  WBC 7.4 6.1 6.4 8.8 7.0  HGB 8.2* 7.5* 7.2* 10.5* 9.0*  HCT 25.8* 23.7* 23.3* 31.8* 27.7*  MCV 93.8 96.7 95.5 90.3 93.6  PLT 289 257 254 247 204    Basic Metabolic Panel: Recent Labs  Lab 06/20/23 0312 06/21/23 0859 06/22/23 0807 06/23/23 0557  NA 138 139 137 136  K 4.3 4.2 4.3 4.1  CL 109 112* 109 108  CO2 18* 19* 20* 21*  GLUCOSE 150* 147* 122* 186*  BUN 58* 53* 50* 45*  CREATININE 1.52* 1.59* 1.57* 1.48*  CALCIUM 8.6* 8.3* 8.5* 8.5*  MG 1.9 2.0 1.9 1.8    GFR: Estimated Creatinine Clearance: 32.8 mL/min (A) (by C-G formula based on SCr of 1.48 mg/dL (H)).  Liver Function Tests: No results for input(s): "AST", "ALT", "ALKPHOS", "BILITOT", "PROT",  "ALBUMIN" in the last 168 hours. No results for input(s): "LIPASE", "AMYLASE" in the last 168 hours. No results for input(s): "AMMONIA" in the last 168 hours.  Coagulation Profile: No results for input(s): "INR", "PROTIME" in the last 168 hours.  Cardiac Enzymes: No results for input(s): "CKTOTAL", "CKMB", "CKMBINDEX", "TROPONINI" in the last 168 hours.  BNP (last 3 results) No results for input(s): "PROBNP" in the last 8760 hours.  Lipid Profile: No results for input(s): "CHOL", "HDL", "LDLCALC", "TRIG", "CHOLHDL", "LDLDIRECT" in the last 72 hours.   Thyroid Function Tests: No results for input(s): "TSH", "T4TOTAL", "FREET4", "T3FREE", "THYROIDAB" in the last 72 hours.  Anemia Panel: No results for input(s): "VITAMINB12", "FOLATE", "FERRITIN", "TIBC", "IRON", "RETICCTPCT" in the last 72 hours.  Urine analysis:    Component Value Date/Time   COLORURINE AMBER (A) 04/15/2023 2010   APPEARANCEUR CLOUDY (A) 04/15/2023 2010   LABSPEC 1.017 04/15/2023 2010   PHURINE 5.0 04/15/2023 2010   GLUCOSEU NEGATIVE 04/15/2023 2010   HGBUR LARGE (A) 04/15/2023 2010   BILIRUBINUR NEGATIVE 04/15/2023 2010   KETONESUR NEGATIVE 04/15/2023 2010   PROTEINUR 100 (A) 04/15/2023 2010   NITRITE NEGATIVE 04/15/2023 2010   LEUKOCYTESUR LARGE (A) 04/15/2023 2010    Sepsis Labs: Lactic Acid, Venous    Component Value Date/Time   LATICACIDVEN 1.3 06/05/2023 1650    MICROBIOLOGY: Recent Results (from the past 240 hour(s))  C Difficile Quick Screen (NO PCR Reflex)     Status: None   Collection Time: 06/17/23  4:52 AM   Specimen: STOOL  Result Value Ref Range Status   C Diff antigen NEGATIVE NEGATIVE Final   C Diff toxin NEGATIVE NEGATIVE Final   C Diff interpretation No C. difficile detected.  Final    Comment: Performed at [facility], [address], [place], [place] [address]    RADIOLOGY STUDIES/RESULTS: No results found.   LOS: 22 days   Jeoffrey Massed, MD  [facility]    To contact the attending provider between 7A-7P or the covering provider during after hours 7P-7A, please log into the web site [email or web address] and access using universal [facility] password for that web site. If you do not have the password, please call the hospital operator.  06/26/2023, 11:08 AM

## 2023-06-26 NOTE — Progress Notes (Signed)
Calorie Count Note  48 hour calorie count ordered. Day 2 results:  Diet: dysphagia 3 with nectar thick liquids Supplements: Might Shake po TID with meals (330 kcal, 9 grams of protein), Magic Cup po TID with meals (290 kcal, 9 grams of protein)  No meal documentation receipts collected for the past 24 hours. Able to meet with pt at bedside who reported what she had at meals. Pt does not like Mighty Shake and has not been drinking these, but does like [employer]. Willing to try chocolate Ensure thickened to nectar-thick.  Dinner 9/11: nectar thick cranberry juice, nectar thick sweet tea (145 kcal, 0 grams of protein) Breakfast 9/12: chopped blueberry muffin (170 kcal, 2 grams of protein) Lunch 9/12: Magic Cup (290 kcal, 9 grams of protein)  Total intake: 605 kcal (37% of minimum estimated needs)  11 grams of protein (13% of minimum estimated needs)  Estimated Nutritional Needs:  Kcal:  1650-1850 Protein:  85-100 grams Fluid:  1.6-1.8 L  Nutrition Dx: Inadequate oral intake related to inability to eat as evidenced by NPO status.  -Progressing, being addressed via PO diet, oral nutrition supplements, and nocturnal tube feeds  Goal: Patient will meet greater than or equal to 90% of their needs   Intervention:  -Plan is to continue calorie count another 24 hours per discussion with MD. Elnoria Howard piece of paper with calorie count instructions on envelope. Please document % consumed for each item on patient's meal tray ticket. Also document any intake of snacks and supplements and keep in envelope. -Will discontinue Mighty Shake as pt does not like these supplements. -Provide Ensure Enlive po TID with meals, each supplement provides 350 kcal and 20 grams of protein. Pt prefers chocolate flavor. Please thicken to appropriate consistency per SLP recommendations. -Continue Magic cup TID with meals, each supplement provides 290 kcal and 9 grams of protein. -Continue Banatrol TF TID per  tube.  Increase nocturnal tube feeds via Cortrak back to previous rate to meet higher portion of estimated needs in setting of ongoing poor PO intake: -Vivonex RTF @ 83 mL/hour x 12 hours from 1800-0600 (total of 996 mL nightly) -Provides: 996 kcal, 50 grams of protein, 845 mL H2O daily -Meets 60% minimum kcal needs and 59% of minimum protein needs Discussed with RN and MD.  Letta Median, MS, RD, LDN, CNSC Pager number available on Amion

## 2023-06-26 NOTE — Progress Notes (Signed)
Physical Therapy Treatment Patient Details Name: [name] MRN: [id number] DOB: 08-01-1946 Today's Date: 06/26/2023   History of Present Illness Pt is 77 year old presented to [facility] on  06/04/23 for acute respiratory failure due to PNA. Intubatd on admission. Extubated 8/28. Pt encephaloathic post extubation. 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    PT Comments  Patient with flat affect and required encouragement to participate. Found pt had been incontinent of bowels and provided assistance to clean up prior to OOB. Educated pt in need to notify nursing ASAP when this happens to reduce further skin irritation on her bottom. Pt mobilizing with less physical assist, however refused to attempt ambulation (more than 3 ft to chair) due to weakness and fatigue. Prepared to ambulate with chair follow and pt refused. Making slow progress, and is progressing.    If plan is discharge home, recommend the following: A lot of help with bathing/dressing/bathroom;Assist for transportation;Help with stairs or ramp for entrance;Two people to help with walking and/or transfers;Assistance with cooking/housework;Supervision due to cognitive status;Direct supervision/assist for medications management;Direct supervision/assist for financial management   Can travel by private vehicle     No  Equipment Recommendations  Other (comment) (defer to next venue of care)    Recommendations for Other Services       Precautions / Restrictions Precautions Precautions: Fall Precaution Comments:  Cortrak Restrictions Weight Bearing Restrictions: No     Mobility  Bed Mobility Overal bed mobility: Needs Assistance Bed Mobility: Rolling, Sidelying to Sit Rolling: Min assist, Used rails Sidelying to sit: Min assist       General bed mobility comments: rolling rt and lt for pericare due to incontinent of bowels on arrival (pt educated she must can nursing when she has voided so they can come clean her to prevent further irritaiton to skin on her bottom    Transfers Overall transfer level: Needs assistance Equipment used: Rolling walker (2 wheels) Transfers: Sit to/from Stand, Bed to chair/wheelchair/BSC Sit to Stand: Min assist   Step pivot transfers: Min assist            Ambulation/Gait               General Gait Details: pt refused ambulating further than the chair due to feeling weak   Stairs             Wheelchair Mobility     Tilt Bed    Modified Rankin (Stroke Patients Only) Modified Rankin (Stroke Patients Only) Pre-Morbid Rankin Score: Slight disability Modified Rankin: Moderately severe disability     Balance Overall balance assessment: Needs assistance Sitting-balance support: No upper extremity supported, Single extremity supported Sitting balance-Leahy Scale: Fair     Standing balance support: Bilateral upper extremity supported Standing balance-Leahy Scale: Poor                              Cognition Arousal: Alert Behavior During Therapy: Flat affect Overall Cognitive Status: Impaired/Different from baseline Area of Impairment: Attention, Memory, Following commands, Safety/judgement, Awareness, Problem solving  Current Attention Level: Sustained Memory: Decreased recall of precautions, Decreased short-term memory Following Commands: Follows one step commands consistently Safety/Judgement: Decreased awareness of safety, Decreased awareness of deficits Awareness: Emergent Problem  Solving: Slow processing, Requires verbal cues General Comments: Pt required encouragement to participate.        Exercises      General Comments        Pertinent Vitals/Pain Pain Assessment Pain Assessment: Faces Faces Pain Scale: Hurts whole lot Pain Location: bottom Pain Descriptors / Indicators: Guarding, Restless Pain Intervention(s): Limited activity within patient's tolerance, Monitored during session, Repositioned, Other (comment) (cleaned bottom and applied cream; air cushion in chair)    Home Living Family/patient expects to be discharged to:: Private residence Living Arrangements: Alone Available Help at Discharge: Family;Friend(s);Available PRN/intermittently Type of Home: House Home Access: Stairs to enter Entrance Stairs-Rails: Right;Left Entrance Stairs-Number of Steps: 2   Home Layout: One level Home Equipment: Agricultural consultant (2 wheels);Tub bench Additional Comments: Son and daughter-in-law live nearby.; Reports typically sleeping on the couch    Prior Function            PT Goals (current goals can now be found in the care plan section) Acute Rehab PT Goals Patient Stated Goal: go home PT Goal Formulation: With patient Time For Goal Achievement: 06/29/23 Potential to Achieve Goals: Fair Progress towards PT goals: Progressing toward goals    Frequency    Min 1X/week      PT Plan      Co-evaluation              AM-PAC PT "6 Clicks" Mobility   Outcome Measure  Help needed turning from your back to your side while in a flat bed without using bedrails?: A Little Help needed moving from lying on your back to sitting on the side of a flat bed without using bedrails?: A Lot Help needed moving to and from a bed to a chair (including a wheelchair)?: A Little (based on AM session with OT, too anxious to attempt in PM with PTA) Help needed standing up from a chair using your arms (e.g., wheelchair or bedside chair)?: A Little Help needed to  walk in hospital room?: Total Help needed climbing 3-5 steps with a railing? : Total 6 Click Score: 13    End of Session Equipment Utilized During Treatment: Gait belt Activity Tolerance: Patient limited by fatigue Patient left: with call bell/phone within reach;in chair;with chair alarm set Nurse Communication: Mobility status PT Visit Diagnosis: Unsteadiness on feet (R26.81);Other abnormalities of gait and mobility (R26.89);Muscle weakness (generalized) (M62.81)     Time: 4403-4742 PT Time Calculation (min) (ACUTE ONLY): 46 min  Charges:    $Therapeutic Activity: 38-52 mins PT General Charges $$ ACUTE PT VISIT: 1 Visit                      Jerolyn Center, PT Acute Rehabilitation Services  Office [phone]    Zena Amos 06/26/2023, 1:24 PM

## 2023-06-27 DIAGNOSIS — I10 Essential (primary) hypertension: Secondary | ICD-10-CM | POA: Diagnosis not present

## 2023-06-27 DIAGNOSIS — F32A Depression, unspecified: Secondary | ICD-10-CM | POA: Diagnosis not present

## 2023-06-27 DIAGNOSIS — J9601 Acute respiratory failure with hypoxia: Secondary | ICD-10-CM | POA: Diagnosis not present

## 2023-06-27 DIAGNOSIS — R1312 Dysphagia, oropharyngeal phase: Secondary | ICD-10-CM | POA: Diagnosis not present

## 2023-06-27 LAB — GLUCOSE, CAPILLARY
Glucose-Capillary: 124 mg/dL — ABNORMAL HIGH (ref 70–99)
Glucose-Capillary: 118 mg/dL — ABNORMAL HIGH (ref 70–99)
Glucose-Capillary: 175 mg/dL — ABNORMAL HIGH (ref 70–99)
Glucose-Capillary: 136 mg/dL — ABNORMAL HIGH (ref 70–99)
Glucose-Capillary: 150 mg/dL — ABNORMAL HIGH (ref 70–99)
Glucose-Capillary: 168 mg/dL — ABNORMAL HIGH (ref 70–99)

## 2023-06-27 LAB — CBC
HCT: 28.6 % — ABNORMAL LOW (ref 36.0–46.0)
Hemoglobin: 9.5 g/dL — ABNORMAL LOW (ref 12.0–15.0)
MCH: 30.4 pg (ref 26.0–34.0)
MCHC: 33.2 g/dL (ref 30.0–36.0)
MCV: 91.4 fL (ref 80.0–100.0)
Platelets: 207 10*3/uL (ref 150–400)
RBC: 3.13 MIL/uL — ABNORMAL LOW (ref 3.87–5.11)
RDW: 16 % — ABNORMAL HIGH (ref 11.5–15.5)
WBC: 17.9 10*3/uL — ABNORMAL HIGH (ref 4.0–10.5)
nRBC: 0 % (ref 0.0–0.2)

## 2023-06-27 LAB — BASIC METABOLIC PANEL
Anion gap: 10 (ref 5–15)
BUN: 36 mg/dL — ABNORMAL HIGH (ref 8–23)
CO2: 18 mmol/L — ABNORMAL LOW (ref 22–32)
Calcium: 8.7 mg/dL — ABNORMAL LOW (ref 8.9–10.3)
Chloride: 107 mmol/L (ref 98–111)
Creatinine, Ser: 1.49 mg/dL — ABNORMAL HIGH (ref 0.44–1.00)
GFR, Estimated: 36 mL/min — ABNORMAL LOW (ref >60)
Glucose, Bld: 144 mg/dL — ABNORMAL HIGH (ref 70–99)
Potassium: 3.8 mmol/L (ref 3.5–5.1)
Sodium: 135 mmol/L (ref 135–145)

## 2023-06-27 MED ORDER — OXYCODONE-ACETAMINOPHEN 5-325 MG PO TABS
1.0000 | ORAL_TABLET | Freq: Three times a day (TID) | ORAL | Status: DC | PRN
  Administered 2023-06-27 – 2023-07-02 (×4): 1 via ORAL
  Filled 2023-06-27 (×4): qty 1

## 2023-06-27 NOTE — TOC Progression Note (Signed)
Transition of Care [facility]) - Progression Note    Patient Details  Name: [name] MRN: [id number] Date of Birth: 10-08-1946  Transition of Care [facility]) CM/SW Contact  Mearl Latin, LCSW Phone Number: 06/27/2023, 12:23 PM  Clinical Narrative:    CSW received consult for possible SNF placement at time of discharge. CSW spoke with patient. Patient reported that patient's family is currently unable to care for patient at their home given patient's current physical needs and fall risk. Patient expressed understanding of PT recommendation and is agreeable to SNF placement at time of discharge. Patient stated she has been to SNF before [facility]). CSW discussed insurance authorization process and will provide Medicare SNF ratings list. CSW will send out referrals for review and provide bed offers as available once Cortrak is removed.   Skilled Nursing Rehab Facilities-   [email or web address]   Ratings out of 5 stars (5 the highest)   Name Address  Phone # Quality Care Staffing Health Inspection Overall  [facility] [address] [phone] 2 1 5 4   [facility] [address] [place] [phone] 4 1 3 2   [facility] [address], [facility] [phone] 2 1 1 1   [facility] [address] [place] [phone] 4 1 3 2   [facility]  [address], [place] [phone] 3 2 5 5   [facility] [address], [facility] [phone] 2 1 2 1   [facility] [address], [place] [phone] 4 1 2 1   [facility] [address], [place] [phone] 2 4 3 3   [address] ([facility]) [address], [place] [phone] 3 2 2 2   [facility] [address] [place], [place] [phone] 1 2 1 1   [facility] [address], [place] [phone] 3 1 1 1   [facility]igh[facility] [address] [place] [phone] 4 3 4 4   [facility]  [address], [place] [phone] 3 4 3 3           [facility] [address] [place] [phone]      [facility], [place] [address], [place] [phone] 1 1 2 1   [facility] [address] [place] [phone] 2 2 4 4   [facility] [address] [place]itto [phone] 2 1 4 3   [facility] [address], [place] [phone] 3 3 3 3           [address] (no [facility]) [address], [place] [phone] 4 4 5 5   [facility] (No [employer]) [address] [place], [place] [phone] 2 2 4 4   [facility] [address] [place] [phone] 2 1 2 1   [facility] (No [employer]) [address], [place] [phone] 5 5 5 5   [facility] [address] [facility] [phone] 2 3 5 5   [facility] [address] [place] [phone] 2 1 1 1   [facility] [address] [place] [phone] 5 2 5 5   [facility]  [address] [place] [phone] 2 2 2 2   [facility] [address] [place] [phone] 4 2 1 1   [facility] [address] [place], [place] [phone] 2 2 3 3           [facility] [address], [place] [phone] 1 1 1 1   [facility] [address] [place] Clines  [phone] 2 3 3 3   [facility] (No Humana) [address],  [place] [phone] 2 1 3 2    [facility]) [address], [place] [phone] 1 1 1 1   [facility] [address], [place] [phone] [facility] [address], [place] [phone] 2 1 1 1           [facility] [address] [place], [place] [phone] 4 4 5 5   [facility])  [address] [place] [phone] 2 1 2 1   [facility]) [address] [place] [phone]  1 4 3   [facility] [address], [place] [phone] 3 5 4 5   [address] [place], [place] [phone] 3 2 2 2    [facility]) [address] [place] [phone] 2 1 3 2       Expected Discharge Plan: Skilled Nursing Facility Barriers to Discharge: Continued Medical Work up, English as a second language teacher, SNF Pending bed offer  Expected Discharge Plan and Services In-house Referral: Clinical Social Work   Post Acute Care Choice: Skilled Nursing Facility Living arrangements for the past 2 months: Single Family Home                                       Social Determinants of Health (SDOH) Interventions SDOH Screenings   Food Insecurity: No Food Insecurity (02/12/2023)  Housing: Patient Unable To Answer (02/12/2023)  Transportation Needs: No Transportation Needs (02/12/2023)  Utilities: Not At Risk (02/12/2023)  Tobacco Use: Medium Risk (06/04/2023)    Readmission Risk Interventions    06/27/2023    8:42 AM 06/04/2023    3:49 PM  Readmission Risk Prevention Plan  Transportation Screening Complete Complete  Medication Review (RN Care Manager)  Referral to Pharmacy  PCP or Specialist appointment within 3-5 days of discharge  Complete  HRI or Home Care Consult  Complete  SW Recovery Care/Counseling Consult  Complete  Palliative Care Screening  Not Applicable  Skilled Nursing Facility  Not Applicable

## 2023-06-27 NOTE — Progress Notes (Signed)
Calorie Count Note   48 hour calorie count ordered-extended given poor PO and desire for patient to stop tube feedings.   Day 3 results:   Diet: dysphagia 3 with nectar thick liquids Supplements:  -Magic Cup po TID with meals (290 kcal, 9 grams of protein) -Ensure Plus High Protein po TID, each supplement provides 350 kcal and 20 grams of protein.    Spoke with patient over the phone regarding PO intakes. Pt a bit confused and reports she was told to "eat inside out". Not a reliable historian. Encouraged pt to at least drink her Ensures. Told this RD she had not eaten any breakfast but per RN, this morning pt consumed an Ensure and bites of a muffin and 1/2 a juice. Requested that RN reach out to RD to report what pt consumes for lunch.   Dinner 9/12: nothing documented, pt unable to tell me what she ate Breakfast 9/13: 82 kcals, 0g protein Lunch 9/13: pending Supplements: 1 Ensure = 350 kcals, 20g protein   Total intake: 432 kcal (26% of minimum estimated needs)  20 grams of protein (23% of minimum estimated needs)   Estimated Nutritional Needs:  Kcal:  1650-1850 Protein:  85-100 grams Fluid:  1.6-1.8 L   Nutrition Dx: Inadequate oral intake related to inability to eat as evidenced by NPO status.  -Progressing, being addressed via PO diet, oral nutrition supplements, and nocturnal tube feeds   Goal: Patient will meet greater than or equal to 90% of their needs    Intervention:  -Plan is to continue calorie count through weekend. Calorie count instructions on envelope. Please document % consumed for each item on patient's meal tray ticket. Also document any intake of snacks and supplements and keep in envelope. -Continue Ensure Enlive po TID with meals, each supplement provides 350 kcal and 20 grams of protein. Pt prefers chocolate flavor. Please thicken to appropriate consistency per SLP recommendations. -Continue Magic cup TID with meals, each supplement provides 290 kcal and 9  grams of protein. -Continue Banatrol TF TID per tube.   Continue nocturnal tube feeds via Cortrak back to previous rate to meet higher portion of estimated needs in setting of ongoing poor PO intake: -Vivonex RTF @ 83 mL/hour x 12 hours from 1800-0600 (total of 996 mL nightly) -Provides: 996 kcal, 50 grams of protein, 845 mL H2O daily -Meets 60% minimum kcal needs and 59% of minimum protein needs   Tilda Franco, MS, RD, LDN Inpatient Clinical Dietitian Contact information available via Amion

## 2023-06-27 NOTE — TOC Progression Note (Signed)
Transition of Care [facility]) - Progression Note    Patient Details  Name: [name] MRN: [id number] Date of Birth: 01-11-1946  Transition of Care [facility]) CM/SW Contact  Mearl Latin, LCSW Phone Number: 06/27/2023, 8:42 AM  Clinical Narrative:    CSW continuing to follow.         Expected Discharge Plan and Services                                               Social Determinants of Health (SDOH) Interventions SDOH Screenings   Food Insecurity: No Food Insecurity (02/12/2023)  Housing: Patient Unable To Answer (02/12/2023)  Transportation Needs: No Transportation Needs (02/12/2023)  Utilities: Not At Risk (02/12/2023)  Tobacco Use: Medium Risk (06/04/2023)    Readmission Risk Interventions    06/04/2023    3:49 PM  Readmission Risk Prevention Plan  Transportation Screening Complete  Medication Review (RN Care Manager) Referral to Pharmacy  PCP or Specialist appointment within 3-5 days of discharge Complete  HRI or Home Care Consult Complete  SW Recovery Care/Counseling Consult Complete  Palliative Care Screening Not Applicable  Skilled Nursing Facility Not Applicable

## 2023-06-27 NOTE — TOC Progression Note (Signed)
Transition of Care [facility]) - Progression Note    Patient Details  Name: [name] MRN: [id number] Date of Birth: August 16, 1946  Transition of Care [facility]) CM/SW Contact  Mearl Latin, LCSW Phone Number: 06/27/2023, 8:44 AM  Clinical Narrative:    CSW continuing to follow for medical stability and progression of diet.    Expected Discharge Plan: Skilled Nursing Facility Barriers to Discharge: Continued Medical Work up, English as a second language teacher, SNF Pending bed offer  Expected Discharge Plan and Services In-house Referral: Clinical Social Work   Post Acute Care Choice: Skilled Nursing Facility Living arrangements for the past 2 months: Single Family Home                                       Social Determinants of Health (SDOH) Interventions SDOH Screenings   Food Insecurity: No Food Insecurity (02/12/2023)  Housing: Patient Unable To Answer (02/12/2023)  Transportation Needs: No Transportation Needs (02/12/2023)  Utilities: Not At Risk (02/12/2023)  Tobacco Use: Medium Risk (06/04/2023)    Readmission Risk Interventions    06/27/2023    8:42 AM 06/04/2023    3:49 PM  Readmission Risk Prevention Plan  Transportation Screening Complete Complete  Medication Review (RN Care Manager)  Referral to Pharmacy  PCP or Specialist appointment within 3-5 days of discharge  Complete  HRI or Home Care Consult  Complete  SW Recovery Care/Counseling Consult  Complete  Palliative Care Screening  Not Applicable  Skilled Nursing Facility  Not Applicable

## 2023-06-27 NOTE — Progress Notes (Signed)
PROGRESS NOTE        PATIENT DETAILS Name: [name] Age: 77 y.o. Sex: female Date of Birth: 23-Dec-1945 Admit Date: 06/04/2023 Admitting Physician Steffanie Dunn, DO UYQ:[id number], Barry Dienes, MD  Brief Summary: Patient is a 77 y.o.  female with history of CVA, seizure, HTN, CAD s/p PCI May 2024, CKD 4, seizure disorder-presented to the ED on 8/21 with shortness of breath- found to have severe hypoxia-initially required BiPAP but subsequently intubated-admitted to the ICU-stabilized-extubated on 8/28-subsequently developed severe encephalopathy, found to have CVA, oropharyngeal dysphagia requiring NG tube feeds.  See below for further details.  Significant events: 8/21>> admitted, intubated 8/28>>extubated 9/02>> [facility] to [facility] 9/07>> MRI significant for subacute CVA  Significant studies: 8/21>> CXR: Multilobar right-sided PNA 8/21>> CT chest: Large airspace opacity involving right upper/lower lobes 8/30>> Echo: EF 60-65% 9/07>> MRI brain: Subacute infarct left parietal lobe 9/08>> MRI brain: Chronic occlusion left ICA 9/09>> carotid Doppler: Known chronic left ICA occlusion,<50% right carotid. 9/09>> LDL: 27 9/09>> A1c: 5.9  Significant microbiology data: 8/21>> COVID/influenza/RSV PCR: Negative 8/21>> respiratory virus panel: Negative 8/21>> tracheal aspirate: Negative 8/21>> blood culture: Negative 9/03>> C. difficile studies: Negative  Procedures: 8/23>>Cortrak  Consults: PCCM Neuro  Subjective: No issues-calorie count in progress.  Claims she did eat quite a bit yesterday-unfortunately dinner not documented-appears to have eaten 20% of lunch.  Objective: Vitals: Blood pressure 134/64, pulse 98, temperature 98.1 F (36.7 C), temperature source Oral, resp. rate 20, height 5\' 3"  (1.6 m), weight 82.1 kg, SpO2 98%.   Exam: Gen Exam:Alert awake-not in any distress HEENT:atraumatic, normocephalic Chest: B/L clear to auscultation  anteriorly CVS:S1S2 regular Abdomen:soft non tender, non distended Extremities:no edema Neurology: Non focal Skin: no rash  Pertinent Labs/Radiology:    Latest Ref Rng & Units 06/27/2023    7:11 AM 06/26/2023    3:22 AM 06/25/2023    6:27 AM  CBC  WBC 4.0 - 10.5 K/uL 17.9  7.0  8.8   Hemoglobin 12.0 - 15.0 g/dL 9.5  9.0  56.3   Hematocrit 36.0 - 46.0 % 28.6  27.7  31.8   Platelets 150 - 400 K/uL 207  204  247     Lab Results  Component Value Date   NA 135 06/27/2023   K 3.8 06/27/2023   CL 107 06/27/2023   CO2 18 (L) 06/27/2023      Assessment/Plan: Severe sepsis secondary to pneumonia (POA) Acute hypoxic respiratory failure Overall improved Completed a course of antibiotics Supportive care  Acute metabolic encephalopathy Slowly improving Supportive care Delirium precautions  AKI on CKD 4 AKI likely hemodynamically mediated Creatinine slowly improving and close to baseline Follow electrolytes periodically  Normocytic anemia Due to critical illness/CKD/significant ecchymosis/bruising No evidence of GI bleeding S/p 4 units of PRBC so far-last on 9/10 Follow CBC closely.  CAD s/p PCI May 2024 On aspirin/Brilinta/statin/beta-blocker Thankfully no chest pain/anginal symptoms  Subacute CVA Workup as above Neurology recommending aspirin/Brilinta (as patient had CVA on aspirin/Plavix)  HTN BP stable Continue metoprolol Amlodipine/Imdur/hydralazine on hold-slowly resume when able  Seizure disorder Keppra  GERD PPI  Mood disorder/depression Home meds on hold-allow mentation to improve further before resuming.  Oropharyngeal dysphagia Continue to acute illness/debility/deconditioning NG feedings in place-nocturnal feedings only SLP following-on dysphagia 3 diet. Mentation much better-hopefully she can continue to increase oral intake-and we can discontinue NG tube.  Per prior note-IR PEG tube not possible due to ventral hernia-due to cardiac  issues-aspirin/Brilinta-difficult for surgical PEG.    11 mm right thyroid nodule Per radiology not clinically significant-no follow-up imaging recommended  Nutrition Status: Nutrition Problem: Inadequate oral intake Etiology: inability to eat Signs/Symptoms: NPO status Interventions: Hormel Shake, Tube feeding, Other (Comment), Calorie Count (banatrol)  BMI: Estimated body mass index is 32.06 kg/m as calculated from the following:   Height as of this encounter: 5\' 3"  (1.6 m).   Weight as of this encounter: 82.1 kg.   Code status:   Code Status: Full Code   DVT Prophylaxis: heparin injection 5,000 Units Start: 06/20/23 1800 Place and maintain sequential compression device Start: 06/18/23 1419 SCDs Start: 06/04/23 0753   Family Communication: Tiffani Campbel [phone] on 9/13    Disposition Plan: Status is: Inpatient Remains inpatient appropriate because: Severity of illness   Planned Discharge Destination:SNF   Diet: Diet Order             DIET DYS 3 Room service appropriate? Yes with Assist; Fluid consistency: Nectar Thick  Diet effective now                     Antimicrobial agents: Anti-infectives (From admission, onward)    Start     Dose/Rate Route Frequency Ordered Stop   06/04/23 0830  cefTRIAXone (ROCEPHIN) 2 g in sodium chloride 0.9 % 100 mL IVPB        2 g 200 mL/hr over 30 Minutes Intravenous Every 24 hours 06/04/23 0829 06/10/23 0935   06/04/23 0800  doxycycline (VIBRAMYCIN) 100 mg in sodium chloride 0.9 % 250 mL IVPB  Status:  Discontinued        100 mg 125 mL/hr over 120 Minutes Intravenous Every 12 hours 06/04/23 0735 06/05/23 1000   06/04/23 0645  vancomycin (VANCOCIN) IVPB 1000 mg/200 mL premix  Status:  Discontinued        1,000 mg 200 mL/hr over 60 Minutes Intravenous  Once 06/04/23 0630 06/04/23 0631   06/04/23 0645  ceFEPIme (MAXIPIME) 2 g in sodium chloride 0.9 % 100 mL IVPB        2 g 200 mL/hr over 30 Minutes Intravenous   Once 06/04/23 0630 06/04/23 0729   06/04/23 0645  vancomycin (VANCOREADY) IVPB 1250 mg/250 mL        1,250 mg 166.7 mL/hr over 90 Minutes Intravenous  Once 06/04/23 0631 06/04/23 0949        MEDICATIONS: Scheduled Meds:  aspirin  81 mg Oral Daily   atorvastatin  40 mg Oral Daily   Chlorhexidine Gluconate Cloth  6 each Topical Daily   feeding supplement  237 mL Oral TID WC   fiber supplement (BANATROL TF)  60 mL Oral TID   Gerhardt's butt cream   Topical QID   heparin injection (subcutaneous)  5,000 Units Subcutaneous Q8H   levETIRAcetam  500 mg Oral BID   lipase/protease/amylase  24,000 Units Oral TID AC   melatonin  3 mg Oral QHS   metoprolol tartrate  100 mg Oral BID   mirtazapine  15 mg Oral QHS   mouth rinse  15 mL Mouth Rinse 4 times per day   pantoprazole  40 mg Oral Daily   ticagrelor  90 mg Oral BID   Vivonex RTF  1,000 mL Per Tube Q24H   Continuous Infusions:  sodium chloride Stopped (06/13/23 0636)   PRN Meds:.acetaminophen, docusate, hydrALAZINE, ipratropium-albuterol, loperamide, metoprolol tartrate, ondansetron (ZOFRAN) IV, mouth  rinse, polyethylene glycol, senna-docusate, traZODone   I have personally reviewed following labs and imaging studies  LABORATORY DATA: CBC: Recent Labs  Lab 06/23/23 0557 06/24/23 0342 06/25/23 0627 06/26/23 0322 06/27/23 0711  WBC 6.1 6.4 8.8 7.0 17.9*  HGB 7.5* 7.2* 10.5* 9.0* 9.5*  HCT 23.7* 23.3* 31.8* 27.7* 28.6*  MCV 96.7 95.5 90.3 93.6 91.4  PLT 257 254 247 204 207    Basic Metabolic Panel: Recent Labs  Lab 06/21/23 0859 06/22/23 0807 06/23/23 0557 06/27/23 0711  NA 139 137 136 135  K 4.2 4.3 4.1 3.8  CL 112* 109 108 107  CO2 19* 20* 21* 18*  GLUCOSE 147* 122* 186* 144*  BUN 53* 50* 45* 36*  CREATININE 1.59* 1.57* 1.48* 1.49*  CALCIUM 8.3* 8.5* 8.5* 8.7*  MG 2.0 1.9 1.8  --     GFR: Estimated Creatinine Clearance: 32.6 mL/min (A) (by C-G formula based on SCr of 1.49 mg/dL (H)).  Liver Function  Tests: No results for input(s): "AST", "ALT", "ALKPHOS", "BILITOT", "PROT", "ALBUMIN" in the last 168 hours. No results for input(s): "LIPASE", "AMYLASE" in the last 168 hours. No results for input(s): "AMMONIA" in the last 168 hours.  Coagulation Profile: No results for input(s): "INR", "PROTIME" in the last 168 hours.  Cardiac Enzymes: No results for input(s): "CKTOTAL", "CKMB", "CKMBINDEX", "TROPONINI" in the last 168 hours.  BNP (last 3 results) No results for input(s): "PROBNP" in the last 8760 hours.  Lipid Profile: No results for input(s): "CHOL", "HDL", "LDLCALC", "TRIG", "CHOLHDL", "LDLDIRECT" in the last 72 hours.   Thyroid Function Tests: No results for input(s): "TSH", "T4TOTAL", "FREET4", "T3FREE", "THYROIDAB" in the last 72 hours.  Anemia Panel: No results for input(s): "VITAMINB12", "FOLATE", "FERRITIN", "TIBC", "IRON", "RETICCTPCT" in the last 72 hours.  Urine analysis:    Component Value Date/Time   COLORURINE AMBER (A) 04/15/2023 2010   APPEARANCEUR CLOUDY (A) 04/15/2023 2010   LABSPEC 1.017 04/15/2023 2010   PHURINE 5.0 04/15/2023 2010   GLUCOSEU NEGATIVE 04/15/2023 2010   HGBUR LARGE (A) 04/15/2023 2010   BILIRUBINUR NEGATIVE 04/15/2023 2010   KETONESUR NEGATIVE 04/15/2023 2010   PROTEINUR 100 (A) 04/15/2023 2010   NITRITE NEGATIVE 04/15/2023 2010   LEUKOCYTESUR LARGE (A) 04/15/2023 2010    Sepsis Labs: Lactic Acid, Venous    Component Value Date/Time   LATICACIDVEN 1.3 06/05/2023 1650    MICROBIOLOGY: No results found for this or any previous visit (from the past 240 hour(s)).   RADIOLOGY STUDIES/RESULTS: No results found.   LOS: 23 days   Jeoffrey Massed, MD  [facility]    To contact the attending provider between 7A-7P or the covering provider during after hours 7P-7A, please log into the web site [email or web address] and access using universal [place] password for that web site. If you do not have the password, please call the  hospital operator.  06/27/2023, 10:34 AM

## 2023-06-28 DIAGNOSIS — F32A Depression, unspecified: Secondary | ICD-10-CM | POA: Diagnosis not present

## 2023-06-28 DIAGNOSIS — J9601 Acute respiratory failure with hypoxia: Secondary | ICD-10-CM | POA: Diagnosis not present

## 2023-06-28 DIAGNOSIS — I10 Essential (primary) hypertension: Secondary | ICD-10-CM | POA: Diagnosis not present

## 2023-06-28 DIAGNOSIS — R1312 Dysphagia, oropharyngeal phase: Secondary | ICD-10-CM | POA: Diagnosis not present

## 2023-06-28 LAB — GLUCOSE, CAPILLARY
Glucose-Capillary: 175 mg/dL — ABNORMAL HIGH (ref 70–99)
Glucose-Capillary: 154 mg/dL — ABNORMAL HIGH (ref 70–99)
Glucose-Capillary: 141 mg/dL — ABNORMAL HIGH (ref 70–99)
Glucose-Capillary: 132 mg/dL — ABNORMAL HIGH (ref 70–99)
Glucose-Capillary: 146 mg/dL — ABNORMAL HIGH (ref 70–99)

## 2023-06-28 NOTE — Progress Notes (Signed)
PROGRESS NOTE        PATIENT DETAILS Name: [name] Age: 77 y.o. Sex: female Date of Birth: 1946/08/05 Admit Date: 06/04/2023 Admitting Physician Steffanie Dunn, DO YQM:[id number], Barry Dienes, MD  Brief Summary: Patient is a 77 y.o.  female with history of CVA, seizure, HTN, CAD s/p PCI May 2024, CKD 4, seizure disorder-presented to the ED on 8/21 with shortness of breath- found to have severe hypoxia-initially required BiPAP but subsequently intubated-admitted to the ICU-stabilized-extubated on 8/28-subsequently developed severe encephalopathy, found to have CVA, oropharyngeal dysphagia requiring NG tube feeds.  See below for further details.  Significant events: 8/21>> admitted, intubated 8/28>>extubated 9/02>> transferred to [facility] 9/07>> MRI significant for subacute CVA  Significant studies: 8/21>> CXR: Multilobar right-sided PNA 8/21>> CT chest: Large airspace opacity involving right upper/lower lobes 8/30>> Echo: EF 60-65% 9/07>> MRI brain: Subacute infarct left parietal lobe 9/08>> MRI brain: Chronic occlusion left ICA 9/09>> carotid Doppler: Known chronic left ICA occlusion,<50% right carotid. 9/09>> LDL: 27 9/09>> A1c: 5.9  Significant microbiology data: 8/21>> COVID/influenza/RSV PCR: Negative 8/21>> respiratory virus panel: Negative 8/21>> tracheal aspirate: Negative 8/21>> blood culture: Negative 9/03>> C. difficile studies: Negative  Procedures: 8/23>>Cortrak  Consults: PCCM Neuro  Subjective: Sleeping this morning when I walked in- easily awoke-no major issues-p.m. she is eating a little bit more than the past several days.  Calorie count in progress.  Objective: Vitals: Blood pressure 115/67, pulse 75, temperature 97.7 F (36.5 C), temperature source Oral, resp. rate 16, height 5\' 3"  (1.6 m), weight 83 kg, SpO2 96%.   Exam: Gen Exam:Alert awake-not in any distress HEENT:atraumatic, normocephalic Chest: B/L clear to  auscultation anteriorly CVS:S1S2 regular Abdomen:soft non tender, non distended Extremities:no edema Neurology: Non focal-but some generalized weakness. Skin: no rash  Pertinent Labs/Radiology:    Latest Ref Rng & Units 06/27/2023    7:11 AM 06/26/2023    3:22 AM 06/25/2023    6:27 AM  CBC  WBC 4.0 - 10.5 K/uL 17.9  7.0  8.8   Hemoglobin 12.0 - 15.0 g/dL 9.5  9.0  95.2   Hematocrit 36.0 - 46.0 % 28.6  27.7  31.8   Platelets 150 - 400 K/uL 207  204  247     Lab Results  Component Value Date   NA 135 06/27/2023   K 3.8 06/27/2023   CL 107 06/27/2023   CO2 18 (L) 06/27/2023      Assessment/Plan: Severe sepsis secondary to pneumonia (POA) Acute hypoxic respiratory failure Overall improved Completed a course of antibiotics Supportive care  Acute metabolic encephalopathy Slowly improving Supportive care Delirium precautions  AKI on CKD 4 AKI likely hemodynamically mediated Creatinine slowly improving and close to baseline Follow electrolytes periodically  Normocytic anemia Due to critical illness/CKD/significant ecchymosis/bruising No evidence of GI bleeding S/p 4 units of PRBC so far-last on 9/10 Follow CBC closely.  CAD s/p PCI May 2024 On aspirin/Brilinta/statin/beta-blocker Thankfully no chest pain/anginal symptoms  Subacute CVA Workup as above Neurology recommending aspirin/Brilinta (as patient had CVA on aspirin/Plavix)  HTN BP stable Continue metoprolol Amlodipine/Imdur/hydralazine on hold-slowly resume when able  Seizure disorder Keppra  GERD PPI  Mood disorder/depression Overall stable On Remeron  Oropharyngeal dysphagia 2/2 acute illness/debility/deconditioning NG feedings in place-nocturnal feedings only SLP following-on dysphagia 3 diet. Encephalopathy improved-calorie count in progress-hopefully we can discontinue NG tube in the next 1-2 days.  Per prior note-IR PEG tube not possible due to ventral hernia-due to cardiac  issues-aspirin/Brilinta-difficult for surgical PEG.    11 mm right thyroid nodule Per radiology not clinically significant-no follow-up imaging recommended  Nutrition Status: Nutrition Problem: Inadequate oral intake Etiology: inability to eat Signs/Symptoms: NPO status Interventions: Hormel Shake, Tube feeding, Other (Comment), Calorie Count (banatrol)  BMI: Estimated body mass index is 32.41 kg/m as calculated from the following:   Height as of this encounter: 5\' 3"  (1.6 m).   Weight as of this encounter: 83 kg.   Code status:   Code Status: Full Code   DVT Prophylaxis: heparin injection 5,000 Units Start: 06/20/23 1800 Place and maintain sequential compression device Start: 06/18/23 1419 SCDs Start: 06/04/23 0753   Family Communication: Nithya Karch [phone] on 9/13    Disposition Plan: Status is: Inpatient Remains inpatient appropriate because: Severity of illness   Planned Discharge Destination:SNF   Diet: Diet Order             DIET DYS 3 Room service appropriate? Yes with Assist; Fluid consistency: Nectar Thick  Diet effective now                     Antimicrobial agents: Anti-infectives (From admission, onward)    Start     Dose/Rate Route Frequency Ordered Stop   06/04/23 0830  cefTRIAXone (ROCEPHIN) 2 g in sodium chloride 0.9 % 100 mL IVPB        2 g 200 mL/hr over 30 Minutes Intravenous Every 24 hours 06/04/23 0829 06/10/23 0935   06/04/23 0800  doxycycline (VIBRAMYCIN) 100 mg in sodium chloride 0.9 % 250 mL IVPB  Status:  Discontinued        100 mg 125 mL/hr over 120 Minutes Intravenous Every 12 hours 06/04/23 0735 06/05/23 1000   06/04/23 0645  vancomycin (VANCOCIN) IVPB 1000 mg/200 mL premix  Status:  Discontinued        1,000 mg 200 mL/hr over 60 Minutes Intravenous  Once 06/04/23 0630 06/04/23 0631   06/04/23 0645  ceFEPIme (MAXIPIME) 2 g in sodium chloride 0.9 % 100 mL IVPB        2 g 200 mL/hr over 30 Minutes Intravenous   Once 06/04/23 0630 06/04/23 0729   06/04/23 0645  vancomycin (VANCOREADY) IVPB 1250 mg/250 mL        1,250 mg 166.7 mL/hr over 90 Minutes Intravenous  Once 06/04/23 0631 06/04/23 0949        MEDICATIONS: Scheduled Meds:  aspirin  81 mg Oral Daily   atorvastatin  40 mg Oral Daily   Chlorhexidine Gluconate Cloth  6 each Topical Daily   feeding supplement  237 mL Oral TID WC   fiber supplement (BANATROL TF)  60 mL Oral TID   Gerhardt's butt cream   Topical QID   heparin injection (subcutaneous)  5,000 Units Subcutaneous Q8H   levETIRAcetam  500 mg Oral BID   lipase/protease/amylase  24,000 Units Oral TID AC   melatonin  3 mg Oral QHS   metoprolol tartrate  100 mg Oral BID   mirtazapine  15 mg Oral QHS   mouth rinse  15 mL Mouth Rinse 4 times per day   pantoprazole  40 mg Oral Daily   ticagrelor  90 mg Oral BID   Vivonex RTF  1,000 mL Per Tube Q24H   Continuous Infusions:  sodium chloride Stopped (06/13/23 0636)   PRN Meds:.acetaminophen, docusate, hydrALAZINE, ipratropium-albuterol, loperamide, metoprolol tartrate, ondansetron (ZOFRAN) IV, mouth  rinse, oxyCODONE-acetaminophen, polyethylene glycol, senna-docusate, traZODone   I have personally reviewed following labs and imaging studies  LABORATORY DATA: CBC: Recent Labs  Lab 06/23/23 0557 06/24/23 0342 06/25/23 0627 06/26/23 0322 06/27/23 0711  WBC 6.1 6.4 8.8 7.0 17.9*  HGB 7.5* 7.2* 10.5* 9.0* 9.5*  HCT 23.7* 23.3* 31.8* 27.7* 28.6*  MCV 96.7 95.5 90.3 93.6 91.4  PLT 257 254 247 204 207    Basic Metabolic Panel: Recent Labs  Lab 06/22/23 0807 06/23/23 0557 06/27/23 0711  NA 137 136 135  K 4.3 4.1 3.8  CL 109 108 107  CO2 20* 21* 18*  GLUCOSE 122* 186* 144*  BUN 50* 45* 36*  CREATININE 1.57* 1.48* 1.49*  CALCIUM 8.5* 8.5* 8.7*  MG 1.9 1.8  --     GFR: Estimated Creatinine Clearance: 32.8 mL/min (A) (by C-G formula based on SCr of 1.49 mg/dL (H)).  Liver Function Tests: No results for input(s):  "AST", "ALT", "ALKPHOS", "BILITOT", "PROT", "ALBUMIN" in the last 168 hours. No results for input(s): "LIPASE", "AMYLASE" in the last 168 hours. No results for input(s): "AMMONIA" in the last 168 hours.  Coagulation Profile: No results for input(s): "INR", "PROTIME" in the last 168 hours.  Cardiac Enzymes: No results for input(s): "CKTOTAL", "CKMB", "CKMBINDEX", "TROPONINI" in the last 168 hours.  BNP (last 3 results) No results for input(s): "PROBNP" in the last 8760 hours.  Lipid Profile: No results for input(s): "CHOL", "HDL", "LDLCALC", "TRIG", "CHOLHDL", "LDLDIRECT" in the last 72 hours.   Thyroid Function Tests: No results for input(s): "TSH", "T4TOTAL", "FREET4", "T3FREE", "THYROIDAB" in the last 72 hours.  Anemia Panel: No results for input(s): "VITAMINB12", "FOLATE", "FERRITIN", "TIBC", "IRON", "RETICCTPCT" in the last 72 hours.  Urine analysis:    Component Value Date/Time   COLORURINE AMBER (A) 04/15/2023 2010   APPEARANCEUR CLOUDY (A) 04/15/2023 2010   LABSPEC 1.017 04/15/2023 2010   PHURINE 5.0 04/15/2023 2010   GLUCOSEU NEGATIVE 04/15/2023 2010   HGBUR LARGE (A) 04/15/2023 2010   BILIRUBINUR NEGATIVE 04/15/2023 2010   KETONESUR NEGATIVE 04/15/2023 2010   PROTEINUR 100 (A) 04/15/2023 2010   NITRITE NEGATIVE 04/15/2023 2010   LEUKOCYTESUR LARGE (A) 04/15/2023 2010    Sepsis Labs: Lactic Acid, Venous    Component Value Date/Time   LATICACIDVEN 1.3 06/05/2023 1650    MICROBIOLOGY: No results found for this or any previous visit (from the past 240 hour(s)).   RADIOLOGY STUDIES/RESULTS: No results found.   LOS: 24 days   Jeoffrey Massed, MD  [facility]    To contact the attending provider between 7A-7P or the covering provider during after hours 7P-7A, please log into the web site [email or web address] and access using universal [place] password for that web site. If you do not have the password, please call the hospital operator.  06/28/2023,  9:52 AM

## 2023-06-29 DIAGNOSIS — R1312 Dysphagia, oropharyngeal phase: Secondary | ICD-10-CM | POA: Diagnosis not present

## 2023-06-29 DIAGNOSIS — F32A Depression, unspecified: Secondary | ICD-10-CM | POA: Diagnosis not present

## 2023-06-29 DIAGNOSIS — J9601 Acute respiratory failure with hypoxia: Secondary | ICD-10-CM | POA: Diagnosis not present

## 2023-06-29 DIAGNOSIS — I10 Essential (primary) hypertension: Secondary | ICD-10-CM | POA: Diagnosis not present

## 2023-06-29 LAB — CBC
HCT: 27.7 % — ABNORMAL LOW (ref 36.0–46.0)
Hemoglobin: 9 g/dL — ABNORMAL LOW (ref 12.0–15.0)
MCH: 31.1 pg (ref 26.0–34.0)
MCHC: 32.5 g/dL (ref 30.0–36.0)
MCV: 95.8 fL (ref 80.0–100.0)
Platelets: 197 10*3/uL (ref 150–400)
RBC: 2.89 MIL/uL — ABNORMAL LOW (ref 3.87–5.11)
RDW: 15.9 % — ABNORMAL HIGH (ref 11.5–15.5)
WBC: 10.4 10*3/uL (ref 4.0–10.5)
nRBC: 0 % (ref 0.0–0.2)

## 2023-06-29 LAB — BASIC METABOLIC PANEL
Anion gap: 10 (ref 5–15)
BUN: 32 mg/dL — ABNORMAL HIGH (ref 8–23)
CO2: 17 mmol/L — ABNORMAL LOW (ref 22–32)
Calcium: 8.5 mg/dL — ABNORMAL LOW (ref 8.9–10.3)
Chloride: 106 mmol/L (ref 98–111)
Creatinine, Ser: 1.4 mg/dL — ABNORMAL HIGH (ref 0.44–1.00)
GFR, Estimated: 39 mL/min — ABNORMAL LOW (ref >60)
Glucose, Bld: 155 mg/dL — ABNORMAL HIGH (ref 70–99)
Potassium: 4.5 mmol/L (ref 3.5–5.1)
Sodium: 133 mmol/L — ABNORMAL LOW (ref 135–145)

## 2023-06-29 LAB — GLUCOSE, CAPILLARY
Glucose-Capillary: 119 mg/dL — ABNORMAL HIGH (ref 70–99)
Glucose-Capillary: 92 mg/dL (ref 70–99)
Glucose-Capillary: 120 mg/dL — ABNORMAL HIGH (ref 70–99)
Glucose-Capillary: 92 mg/dL (ref 70–99)
Glucose-Capillary: 93 mg/dL (ref 70–99)

## 2023-06-29 LAB — MAGNESIUM: Magnesium: 1.6 mg/dL — ABNORMAL LOW (ref 1.7–2.4)

## 2023-06-29 NOTE — Progress Notes (Signed)
PROGRESS NOTE        PATIENT DETAILS Name: [name] Age: 77 y.o. Sex: female Date of Birth: 1946/08/24 Admit Date: 06/04/2023 Admitting Physician Steffanie Dunn, DO ONG:[id number], Barry Dienes, MD  Brief Summary: Patient is a 76 y.o.  female with history of CVA, seizure, HTN, CAD s/p PCI May 2024, CKD 4, seizure disorder-presented to the ED on 8/21 with shortness of breath- found to have severe hypoxia-initially required BiPAP but subsequently intubated-admitted to the ICU-stabilized-extubated on 8/28-subsequently developed severe encephalopathy, found to have CVA, oropharyngeal dysphagia requiring NG tube feeds.  See below for further details.  Significant events: 8/21>> admitted, intubated 8/28>>extubated 9/02>> transferred to [facility] 9/07>> MRI significant for subacute CVA  Significant studies: 8/21>> CXR: Multilobar right-sided PNA 8/21>> CT chest: Large airspace opacity involving right upper/lower lobes 8/30>> Echo: EF 60-65% 9/07>> MRI brain: Subacute infarct left parietal lobe 9/08>> MRI brain: Chronic occlusion left ICA 9/09>> carotid Doppler: Known chronic left ICA occlusion,<50% right carotid. 9/09>> LDL: 27 9/09>> A1c: 5.9  Significant microbiology data: 8/21>> COVID/influenza/RSV PCR: Negative 8/21>> respiratory virus panel: Negative 8/21>> tracheal aspirate: Negative 8/21>> blood culture: Negative 9/03>> C. difficile studies: Negative  Procedures: 8/23>>Cortrak  Consults: PCCM Neuro  Subjective: Thinks appetite is improving-she did not eat dinner last night because she was sleeping and did not have an appetite.  No complaints-completely awake and alert.  Objective: Vitals: Blood pressure 128/60, pulse 71, temperature 97.6 F (36.4 C), temperature source Oral, resp. rate 13, height 5\' 3"  (1.6 m), weight 83 kg, SpO2 98%.   Exam: Gen Exam:Alert awake-not in any distress HEENT:atraumatic, normocephalic Chest: B/L clear to  auscultation anteriorly CVS:S1S2 regular Abdomen:soft non tender, non distended Extremities:no edema Neurology: Non focal Skin: no rash  Pertinent Labs/Radiology:    Latest Ref Rng & Units 06/29/2023    9:37 AM 06/27/2023    7:11 AM 06/26/2023    3:22 AM  CBC  WBC 4.0 - 10.5 K/uL 10.4  17.9  7.0   Hemoglobin 12.0 - 15.0 g/dL 9.0  9.5  9.0   Hematocrit 36.0 - 46.0 % 27.7  28.6  27.7   Platelets 150 - 400 K/uL 197  207  204     Lab Results  Component Value Date   NA 135 06/27/2023   K 3.8 06/27/2023   CL 107 06/27/2023   CO2 18 (L) 06/27/2023      Assessment/Plan: Severe sepsis secondary to pneumonia (POA) Acute hypoxic respiratory failure Overall improved Completed a course of antibiotics Supportive care  Acute metabolic encephalopathy Slowly improving Supportive care Delirium precautions  AKI on CKD 4 AKI likely hemodynamically mediated Creatinine slowly improving and close to baseline Follow electrolytes periodically  Normocytic anemia Due to critical illness/CKD/significant ecchymosis/bruising No evidence of GI bleeding S/p 4 units of PRBC so far-last on 9/10 Follow CBC closely.  CAD s/p PCI May 2024 On aspirin/Brilinta/statin/beta-blocker Thankfully no chest pain/anginal symptoms  Subacute CVA Workup as above Neurology recommending aspirin/Brilinta (as patient had CVA on aspirin/Plavix)  HTN BP stable Continue metoprolol Amlodipine/Imdur/hydralazine on hold-slowly resume when able  Seizure disorder Keppra  GERD PPI  Mood disorder/depression Overall stable On Remeron  Oropharyngeal dysphagia 2/2 acute illness/debility/deconditioning NG feedings in place-nocturnal feedings only SLP following-on dysphagia 3 diet. Encephalopathy improved-calorie count in progress-hopefully we can discontinue NG tube in the next 1-2 days.    Per prior  note-IR PEG tube not possible due to ventral hernia-due to cardiac issues-aspirin/Brilinta-difficult for  surgical PEG.    11 mm right thyroid nodule Per radiology not clinically significant-no follow-up imaging recommended  Nutrition Status: Nutrition Problem: Inadequate oral intake Etiology: inability to eat Signs/Symptoms: NPO status Interventions: Hormel Shake, Tube feeding, Other (Comment), Calorie Count (banatrol)  BMI: Estimated body mass index is 32.41 kg/m as calculated from the following:   Height as of this encounter: 5\' 3"  (1.6 m).   Weight as of this encounter: 83 kg.   Code status:   Code Status: Full Code   DVT Prophylaxis: heparin injection 5,000 Units Start: 06/20/23 1800 Place and maintain sequential compression device Start: 06/18/23 1419 SCDs Start: 06/04/23 0753   Family Communication: Perlene Sigley [phone] on 9/13    Disposition Plan: Status is: Inpatient Remains inpatient appropriate because: Severity of illness   Planned Discharge Destination:SNF   Diet: Diet Order             DIET DYS 3 Room service appropriate? Yes with Assist; Fluid consistency: Nectar Thick  Diet effective now                     Antimicrobial agents: Anti-infectives (From admission, onward)    Start     Dose/Rate Route Frequency Ordered Stop   06/04/23 0830  cefTRIAXone (ROCEPHIN) 2 g in sodium chloride 0.9 % 100 mL IVPB        2 g 200 mL/hr over 30 Minutes Intravenous Every 24 hours 06/04/23 0829 06/10/23 0935   06/04/23 0800  doxycycline (VIBRAMYCIN) 100 mg in sodium chloride 0.9 % 250 mL IVPB  Status:  Discontinued        100 mg 125 mL/hr over 120 Minutes Intravenous Every 12 hours 06/04/23 0735 06/05/23 1000   06/04/23 0645  vancomycin (VANCOCIN) IVPB 1000 mg/200 mL premix  Status:  Discontinued        1,000 mg 200 mL/hr over 60 Minutes Intravenous  Once 06/04/23 0630 06/04/23 0631   06/04/23 0645  ceFEPIme (MAXIPIME) 2 g in sodium chloride 0.9 % 100 mL IVPB        2 g 200 mL/hr over 30 Minutes Intravenous  Once 06/04/23 0630 06/04/23 0729    06/04/23 0645  vancomycin (VANCOREADY) IVPB 1250 mg/250 mL        1,250 mg 166.7 mL/hr over 90 Minutes Intravenous  Once 06/04/23 0631 06/04/23 0949        MEDICATIONS: Scheduled Meds:  aspirin  81 mg Oral Daily   atorvastatin  40 mg Oral Daily   Chlorhexidine Gluconate Cloth  6 each Topical Daily   feeding supplement  237 mL Oral TID WC   fiber supplement (BANATROL TF)  60 mL Oral TID   Gerhardt's butt cream   Topical QID   heparin injection (subcutaneous)  5,000 Units Subcutaneous Q8H   levETIRAcetam  500 mg Oral BID   lipase/protease/amylase  24,000 Units Oral TID AC   melatonin  3 mg Oral QHS   metoprolol tartrate  100 mg Oral BID   mirtazapine  15 mg Oral QHS   mouth rinse  15 mL Mouth Rinse 4 times per day   pantoprazole  40 mg Oral Daily   ticagrelor  90 mg Oral BID   Vivonex RTF  1,000 mL Per Tube Q24H   Continuous Infusions:  sodium chloride Stopped (06/13/23 0636)   PRN Meds:.acetaminophen, docusate, hydrALAZINE, ipratropium-albuterol, loperamide, metoprolol tartrate, ondansetron (ZOFRAN) IV, mouth rinse, oxyCODONE-acetaminophen,  polyethylene glycol, senna-docusate, traZODone   I have personally reviewed following labs and imaging studies  LABORATORY DATA: CBC: Recent Labs  Lab 06/24/23 0342 06/25/23 0627 06/26/23 0322 06/27/23 0711 06/29/23 0937  WBC 6.4 8.8 7.0 17.9* 10.4  HGB 7.2* 10.5* 9.0* 9.5* 9.0*  HCT 23.3* 31.8* 27.7* 28.6* 27.7*  MCV 95.5 90.3 93.6 91.4 95.8  PLT 254 247 204 207 197    Basic Metabolic Panel: Recent Labs  Lab 06/23/23 0557 06/27/23 0711  NA 136 135  K 4.1 3.8  CL 108 107  CO2 21* 18*  GLUCOSE 186* 144*  BUN 45* 36*  CREATININE 1.48* 1.49*  CALCIUM 8.5* 8.7*  MG 1.8  --     GFR: Estimated Creatinine Clearance: 32.8 mL/min (A) (by C-G formula based on SCr of 1.49 mg/dL (H)).  Liver Function Tests: No results for input(s): "AST", "ALT", "ALKPHOS", "BILITOT", "PROT", "ALBUMIN" in the last 168 hours. No results for  input(s): "LIPASE", "AMYLASE" in the last 168 hours. No results for input(s): "AMMONIA" in the last 168 hours.  Coagulation Profile: No results for input(s): "INR", "PROTIME" in the last 168 hours.  Cardiac Enzymes: No results for input(s): "CKTOTAL", "CKMB", "CKMBINDEX", "TROPONINI" in the last 168 hours.  BNP (last 3 results) No results for input(s): "PROBNP" in the last 8760 hours.  Lipid Profile: No results for input(s): "CHOL", "HDL", "LDLCALC", "TRIG", "CHOLHDL", "LDLDIRECT" in the last 72 hours.   Thyroid Function Tests: No results for input(s): "TSH", "T4TOTAL", "FREET4", "T3FREE", "THYROIDAB" in the last 72 hours.  Anemia Panel: No results for input(s): "VITAMINB12", "FOLATE", "FERRITIN", "TIBC", "IRON", "RETICCTPCT" in the last 72 hours.  Urine analysis:    Component Value Date/Time   COLORURINE AMBER (A) 04/15/2023 2010   APPEARANCEUR CLOUDY (A) 04/15/2023 2010   LABSPEC 1.017 04/15/2023 2010   PHURINE 5.0 04/15/2023 2010   GLUCOSEU NEGATIVE 04/15/2023 2010   HGBUR LARGE (A) 04/15/2023 2010   BILIRUBINUR NEGATIVE 04/15/2023 2010   KETONESUR NEGATIVE 04/15/2023 2010   PROTEINUR 100 (A) 04/15/2023 2010   NITRITE NEGATIVE 04/15/2023 2010   LEUKOCYTESUR LARGE (A) 04/15/2023 2010    Sepsis Labs: Lactic Acid, Venous    Component Value Date/Time   LATICACIDVEN 1.3 06/05/2023 1650    MICROBIOLOGY: No results found for this or any previous visit (from the past 240 hour(s)).   RADIOLOGY STUDIES/RESULTS: No results found.   LOS: 25 days   Jeoffrey Massed, MD  [facility]    To contact the attending provider between 7A-7P or the covering provider during after hours 7P-7A, please log into the web site [email or web address] and access using universal [place] password for that web site. If you do not have the password, please call the hospital operator.  06/29/2023, 10:14 AM

## 2023-06-30 DIAGNOSIS — I10 Essential (primary) hypertension: Secondary | ICD-10-CM | POA: Diagnosis not present

## 2023-06-30 DIAGNOSIS — R1312 Dysphagia, oropharyngeal phase: Secondary | ICD-10-CM | POA: Diagnosis not present

## 2023-06-30 DIAGNOSIS — J9601 Acute respiratory failure with hypoxia: Secondary | ICD-10-CM | POA: Diagnosis not present

## 2023-06-30 DIAGNOSIS — F32A Depression, unspecified: Secondary | ICD-10-CM | POA: Diagnosis not present

## 2023-06-30 LAB — GLUCOSE, CAPILLARY
Glucose-Capillary: 102 mg/dL — ABNORMAL HIGH (ref 70–99)
Glucose-Capillary: 105 mg/dL — ABNORMAL HIGH (ref 70–99)
Glucose-Capillary: 105 mg/dL — ABNORMAL HIGH (ref 70–99)
Glucose-Capillary: 112 mg/dL — ABNORMAL HIGH (ref 70–99)
Glucose-Capillary: 117 mg/dL — ABNORMAL HIGH (ref 70–99)
Glucose-Capillary: 151 mg/dL — ABNORMAL HIGH (ref 70–99)
Glucose-Capillary: 87 mg/dL (ref 70–99)

## 2023-06-30 NOTE — [place] FL2 (Signed)
MEDICAID FL2 LEVEL OF CARE FORM     IDENTIFICATION  Patient Name: [name] Birthdate: Apr 02, 1946 Sex: female Admission Date (Current Location): 06/04/2023  [facility] and [place] Number:  Producer, television/film/video and Address:  [facility] [place], [address] [place], [place] [address]      Provider Number: [id number]  Attending Physician Name and Address:  Maretta Bees, MD  Relative Name and Phone Number:       Current Level of Care: Hospital Recommended Level of Care: Skilled Nursing Facility Prior Approval Number:    Date Approved/Denied:   PASRR Number: [id number]  Discharge Plan: SNF    Current Diagnoses: Patient Active Problem List   Diagnosis Date Noted   Acute respiratory failure with hypoxia (HCC) 06/04/2023   Confusion 04/15/2023   Rhabdomyolysis 04/15/2023   Unstable angina (HCC) 01/29/2023   TIA (transient ischemic attack) 01/02/2023   Acute CVA (cerebrovascular accident) (HCC) 04/15/2022   Hypertensive urgency 04/09/2022   History of seizures as a child 04/09/2022   Chronic combined systolic and diastolic CHF (congestive heart failure) (HCC) 04/09/2022   Pyuria 04/09/2022   CKD (chronic kidney disease) stage 4, GFR 15-29 ml/min (HCC) 04/09/2022   Sepsis (HCC) 12/26/2021   UTI (urinary tract infection) 12/26/2021   Aspiration pneumonia (HCC) 12/26/2021   Acute renal failure superimposed on stage 4 chronic kidney disease (HCC) 12/26/2021   Acute on chronic systolic CHF (congestive heart failure) (HCC) 12/26/2021   Stroke (HCC) 12/25/2021   Pharyngeal dysphagia 07/12/2021   Pressure ulcer of sacral region 07/12/2021   Wound of abdomen 07/12/2021   Closed fracture of distal end of left radius 06/15/2021   Degeneration of lumbar intervertebral disc 04/23/2021   Hyponatremia 11/16/2020   Metabolic acidosis, NAG, failure of bicarbonate regeneration 11/16/2020   Nausea & vomiting 11/16/2020   Acute-on-chronic kidney  injury (HCC) 11/16/2020   Abdominal aortic atherosclerosis (HCC) 06/01/2020   Acquired thrombophilia (HCC) 06/01/2020   Insomnia 05/08/2020   Neck pain 01/11/2020   Referred otalgia of both ears 01/11/2020   Frequency of micturition 12/16/2019   Orthostatic hypotension 12/16/2019   Urinary tract infectious disease 12/16/2019   Pneumonia due to coronavirus disease 2019 11/17/2019   Acute combined systolic and diastolic heart failure (HCC) 10/29/2019   COVID-19 10/29/2019   Hyperkalemia 10/16/2019   CAP (community acquired pneumonia) 10/16/2019   Acute metabolic encephalopathy 10/15/2019   Noninfective gastroenteritis and colitis, unspecified 09/01/2019   Hypo-osmolality and hyponatremia 08/04/2019   Spasm 06/03/2019   Seizures (HCC) 01/07/2019   Cerebrovascular accident (CVA) due to occlusion of left carotid artery (HCC) 01/07/2019   CAD, multiple vessel 01/07/2019   H/O ischemic bowel disease 01/07/2019   Protein calorie malnutrition (HCC) 01/04/2019   Acute bronchitis 12/04/2018   Oral phase dysphagia 12/04/2018   Personal history of transient ischemic attack (TIA), and cerebral infarction without residual deficits 11/11/2018   CKD (chronic kidney disease) stage 3, GFR 30-59 ml/min 11/04/2018   Seizure (HCC) 11/03/2018   Anorectal disorder 06/08/2018   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  Ileostomy 10/18/2017 10/14/2017   Ischemic colitis (HCC) 10/14/2017   Acute respiratory failure with hypoxemia (HCC)  10/14/2017   Acute post-operative pain 10/14/2017   Acute kidney injury superimposed on CKD (HCC) 10/14/2017   Anemia 10/14/2017   Benign paroxysmal  positional vertigo 07/29/2017   Nocturia more than twice per night 05/07/2017   Diabetes due to underlying condition w oth circulatory comp (HCC) 05/07/2017   PVD (peripheral vascular disease) (HCC) 05/07/2017   Poor sleep hygiene 05/07/2017   Paradoxical insomnia 05/07/2017   Abnormal feces 04/10/2017   Hilar lymphadenopathy    Hemoptysis 02/19/2017   Pulmonary nodules/lesions, multiple 02/19/2017   Tobacco use 02/19/2017   Disorder of arteries and arterioles, unspecified (HCC) 01/31/2017   Lung field abnormal 01/31/2017   RUQ abdominal pain 01/13/2017   Restless legs syndrome 11/28/2016   Left-sided extracranial carotid artery occlusion 11/26/2016   PAOD (peripheral arterial occlusive disease) (HCC) 11/26/2016   Encounter for routine follow-up after surgery of the circulatory system 11/26/2016   Abnormal weight loss 01/29/2016   Allergic rhinitis 01/29/2016   Chest pain with moderate risk of acute coronary syndrome 10/27/2015   Other slipping, tripping and stumbling without falling, initial encounter 07/28/2015   Acid indigestion 12/31/2013   Pain in left hip 08/26/2013   Screening for gout 05/13/2013   Disorder of skin or subcutaneous tissue 12/22/2012   Difficult or painful urination 09/02/2012   Peripheral blood vessel disorder (HCC) 05/11/2012   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   Follow-up examination, following unspecified surgery 05/11/2012   Occlusion and stenosis of carotid artery without mention of cerebral infarction 04/14/2012   Fatigue 09/24/2011   Gonalgia 07/04/2011   Avitaminosis D 05/02/2011   Chronic pancreatitis (HCC) 04/30/2011   Vitamin B12 deficiency 04/30/2011   Intestinal motility disorder 04/30/2011   Fatty liver 04/30/2011   Adiposity 04/30/2011   Other and unspecified general psychiatric examination 04/26/2011   Abdominal pain 04/09/2011   Iron deficiency anemia, unspecified  04/09/2011   Other general symptoms  04/09/2011    Asthma 04/09/2011   Type 2 diabetes mellitus with diabetic peripheral angiopathy without gangrene (HCC) 01/24/2011   Abnormal weight gain 11/30/2010   Dyslipidemia    Vertigo    Right-sided extracranial carotid artery stenosis    Normal nuclear stress test    Feeling bilious 07/11/2010   Underimmunization status 07/11/2010   Pancreas (digestive gland) works poorly 05/30/2010   B12 deficiency 03/19/2010   DVT 03/16/2010   BLIND LOOP SYNDROME 03/16/2010   Diarrhea 03/16/2010   Chronic depression 02/23/2010   Major depression, single episode 02/23/2010   Cough 11/17/2009   Plantar verruca 11/17/2009   Arteriosclerosis of coronary artery 11/16/2009   Claudication (HCC) 11/16/2009   Compulsive tobacco user syndrome 11/16/2009   HLD (hyperlipidemia) 11/16/2009   Nontoxic single thyroid nodule 11/16/2009   Obstructive apnea 11/16/2009   Radial styloid tenosynovitis 11/16/2009   LBP (low back pain) 11/16/2009   Gastroparesis 11/16/2009   LAXATIVE ABUSE 12/17/2007   Accelerated hypertension 12/17/2007   Acid reflux 12/17/2007   HIATAL HERNIA 12/17/2007   IBS 12/17/2007   MELANOSIS COLI 12/17/2007   ARTHRITIS 12/17/2007   HEPATOMEGALY 12/17/2007    Orientation RESPIRATION BLADDER Height & Weight     Self, Time, Place  Normal Incontinent, External catheter Weight: 181 lb 14.1 oz (82.5 kg) Height:  5\' 3"  (160 cm)  BEHAVIORAL SYMPTOMS/MOOD NEUROLOGICAL BOWEL NUTRITION STATUS      Incontinent Diet (See DC Summary)  AMBULATORY STATUS COMMUNICATION OF NEEDS Skin   Limited Assist Verbally Other (Comment) (Skin tear on buttocks)  Personal Care Assistance Level of Assistance  Bathing, Feeding, Dressing Bathing Assistance: Limited assistance Feeding assistance: Limited assistance Dressing Assistance: Limited assistance     Functional Limitations Info  Hearing   Hearing Info: Impaired      SPECIAL CARE FACTORS FREQUENCY  PT (By licensed PT), OT  (By licensed OT), Speech therapy     PT Frequency: 5x/week OT Frequency: 5x/week     Speech Therapy Frequency: 2x/week      Contractures Contractures Info: Not present    Additional Factors Info  Code Status, Allergies Code Status Info: Full Allergies Info: Adhesive (Tape), Other, Ace Inhibitors, Clonidine, Sulfa Antibiotics, Topiramate, Codeine, Iodinated Contrast Media, Latex           Current Medications (06/30/2023):  This is the current hospital active medication list Current Facility-Administered Medications  Medication Dose Route Frequency Provider Last Rate Last Admin   0.9 %  sodium chloride infusion  250 mL Intravenous Continuous Steffanie Dunn, DO   Stopped at 06/13/23 0636   acetaminophen (TYLENOL) tablet 650 mg  650 mg Oral Q6H PRN Gaetana Michaelis, MD   650 mg at 06/29/23 2034   aspirin chewable tablet 81 mg  81 mg Oral Daily Amin, Ankit C, MD   81 mg at 06/30/23 0916   atorvastatin (LIPITOR) tablet 40 mg  40 mg Oral Daily Amin, Ankit C, MD   40 mg at 06/30/23 0916   Chlorhexidine Gluconate Cloth 2 % PADS 6 each  6 each Topical Daily Karie Fetch P, DO   6 each at 06/30/23 0916   docusate (COLACE) 50 MG/5ML liquid 100 mg  100 mg Oral BID PRN Amin, Ankit C, MD       feeding supplement (ENSURE ENLIVE / ENSURE PLUS) liquid 237 mL  237 mL Oral TID WC Maretta Bees, MD   237 mL at 06/28/23 0951   fiber supplement (BANATROL TF) liquid 60 mL  60 mL Oral TID Amin, Ankit C, MD   60 mL at 06/29/23 2035   Gerhardt's butt cream   Topical QID Oretha Milch, MD   Given at 06/30/23 0917   heparin injection 5,000 Units  5,000 Units Subcutaneous Q8H Elgergawy, Leana Roe, MD   5,000 Units at 06/30/23 0518   hydrALAZINE (APRESOLINE) injection 5 mg  5 mg Intravenous Q4H PRN Elgergawy, Leana Roe, MD       ipratropium-albuterol (DUONEB) 0.5-2.5 (3) MG/3ML nebulizer solution 3 mL  3 mL Nebulization Q4H PRN Selmer Dominion B, NP       levETIRAcetam (KEPPRA) tablet 500 mg  500 mg Oral  BID Amin, Ankit C, MD   500 mg at 06/30/23 0916   lipase/protease/amylase (CREON) capsule 24,000 Units  24,000 Units Oral TID AC Amin, Ankit C, MD   24,000 Units at 06/30/23 0916   loperamide (IMODIUM) capsule 4 mg  4 mg Oral PRN Amin, Ankit C, MD   4 mg at 06/29/23 0934   melatonin tablet 3 mg  3 mg Oral QHS Amin, Ankit C, MD   3 mg at 06/29/23 2034   metoprolol tartrate (LOPRESSOR) injection 5 mg  5 mg Intravenous Q4H PRN Amin, Ankit C, MD       metoprolol tartrate (LOPRESSOR) tablet 100 mg  100 mg Oral BID Amin, Ankit C, MD   100 mg at 06/30/23 0916   mirtazapine (REMERON SOL-TAB) disintegrating tablet 15 mg  15 mg Oral QHS Elgergawy, Leana Roe, MD   15 mg at 06/29/23 2140  ondansetron (ZOFRAN) injection 4 mg  4 mg Intravenous Q6H PRN Karie Fetch P, DO   4 mg at 06/29/23 1507   Oral care mouth rinse  15 mL Mouth Rinse 4 times per day Karie Fetch P, DO   15 mL at 06/30/23 1610   Oral care mouth rinse  15 mL Mouth Rinse PRN Elgergawy, Leana Roe, MD       oxyCODONE-acetaminophen (PERCOCET/ROXICET) 5-325 MG per tablet 1 tablet  1 tablet Oral Q8H PRN Maretta Bees, MD   1 tablet at 06/28/23 2015   pantoprazole (PROTONIX) EC tablet 40 mg  40 mg Oral Daily Pham, Minh Q, RPH-CPP   40 mg at 06/30/23 0916   polyethylene glycol (MIRALAX / GLYCOLAX) packet 17 g  17 g Oral Daily PRN Amin, Ankit C, MD       senna-docusate (Senokot-S) tablet 1 tablet  1 tablet Oral QHS PRN Amin, Ankit C, MD       ticagrelor (BRILINTA) tablet 90 mg  90 mg Oral BID Elgergawy, Leana Roe, MD   90 mg at 06/30/23 0916   traZODone (DESYREL) tablet 150 mg  150 mg Oral QHS PRN Paliwal, Eliezer Lofts, MD   150 mg at 06/29/23 2034   Vivonex RTF LIQD 1,000 mL  1,000 mL Per Tube Q24H Maretta Bees, MD   1,000 mL at 06/28/23 1755     Discharge Medications: Please see discharge summary for a list of discharge medications.  Relevant Imaging Results:  Relevant Lab Results:   Additional Information SS#: [id number]  Erin Sons,  LCSW

## 2023-06-30 NOTE — TOC Progression Note (Signed)
Transition of Care [facility]) - Progression Note    Patient Details  Name: AZZA KITCHIN MRN: [id number] Date of Birth: December 10, 1945  Transition of Care [facility]) CM/SW Contact  Erin Sons, [place] Phone Number: 06/30/2023, 11:45 AM  Clinical Narrative:     Pt Cortrak likely to be removed today and anticipated DC in next few days. CSW sent bed requests in hub. TOC will follow up with SNF bed offers.   Expected Discharge Plan: Skilled Nursing Facility Barriers to Discharge: Continued Medical Work up, English as a second language teacher, SNF Pending bed offer  Expected Discharge Plan and Services In-house Referral: Clinical Social Work   Post Acute Care Choice: Skilled Nursing Facility Living arrangements for the past 2 months: Single Family Home                                       Social Determinants of Health (SDOH) Interventions SDOH Screenings   Food Insecurity: No Food Insecurity (02/12/2023)  Housing: Patient Unable To Answer (02/12/2023)  Transportation Needs: No Transportation Needs (02/12/2023)  Utilities: Not At Risk (02/12/2023)  Tobacco Use: Medium Risk (06/04/2023)    Readmission Risk Interventions    06/27/2023    8:42 AM 06/04/2023    3:49 PM  Readmission Risk Prevention Plan  Transportation Screening Complete Complete  Medication Review (RN Care Manager)  Referral to Pharmacy  PCP or Specialist appointment within 3-5 days of discharge  Complete  HRI or Home Care Consult  Complete  SW Recovery Care/Counseling Consult  Complete  Palliative Care Screening  Not Applicable  Skilled Nursing Facility  Not Applicable

## 2023-06-30 NOTE — Progress Notes (Signed)
PROGRESS NOTE        PATIENT DETAILS Name: [name] Age: 77 y.o. Sex: female Date of Birth: 11-24-1945 Admit Date: 06/04/2023 Admitting Physician Steffanie Dunn, DO [id number], Barry Dienes, MD  Brief Summary: Patient is a 77 y.o.  female with history of CVA, seizure, HTN, CAD s/p PCI May 2024, CKD 4, seizure disorder-presented to the ED on 8/21 with shortness of breath- found to have severe hypoxia-initially required BiPAP but subsequently intubated-admitted to the ICU-stabilized-extubated on 8/28-subsequently developed severe encephalopathy, found to have CVA, oropharyngeal dysphagia requiring NG tube feeds.  See below for further details.  Significant events: 8/21>> admitted, intubated 8/28>>extubated 9/02>> transferred to [facility] 9/07>> MRI significant for subacute CVA  Significant studies: 8/21>> CXR: Multilobar right-sided PNA 8/21>> CT chest: Large airspace opacity involving right upper/lower lobes 8/30>> Echo: EF 60-65% 9/07>> MRI brain: Subacute infarct left parietal lobe 9/08>> MRI brain: Chronic occlusion left ICA 9/09>> carotid Doppler: Known chronic left ICA occlusion,<50% right carotid. 9/09>> LDL: 27 9/09>> A1c: 5.9  Significant microbiology data: 8/21>> COVID/influenza/RSV PCR: Negative 8/21>> respiratory virus panel: Negative 8/21>> tracheal aspirate: Negative 8/21>> blood culture: Negative 9/03>> C. difficile studies: Negative  Procedures: 8/23>>Cortrak  Consults: PCCM Neuro  Subjective: Ate a significant chunk of her meals yesterday.  No major issues overnight.  Objective: Vitals: Blood pressure (!) 128/50, pulse 68, temperature (!) 97.5 F (36.4 C), temperature source Oral, resp. rate 13, height 5\' 3"  (1.6 m), weight 82.5 kg, SpO2 98%.   Exam: Gen Exam:Alert awake-not in any distress HEENT:atraumatic, normocephalic Chest: B/L clear to auscultation anteriorly CVS:S1S2 regular Abdomen:soft non tender, non  distended Extremities:no edema Neurology: Non focal Skin: no rash  Pertinent Labs/Radiology:    Latest Ref Rng & Units 06/29/2023    9:37 AM 06/27/2023    7:11 AM 06/26/2023    3:22 AM  CBC  WBC 4.0 - 10.5 K/uL 10.4  17.9  7.0   Hemoglobin 12.0 - 15.0 g/dL 9.0  9.5  9.0   Hematocrit 36.0 - 46.0 % 27.7  28.6  27.7   Platelets 150 - 400 K/uL 197  207  204     Lab Results  Component Value Date   NA 133 (L) 06/29/2023   K 4.5 06/29/2023   CL 106 06/29/2023   CO2 17 (L) 06/29/2023      Assessment/Plan: Severe sepsis secondary to pneumonia (POA) Acute hypoxic respiratory failure Overall improved Completed a course of antibiotics Supportive care  Acute metabolic encephalopathy Slowly improving Supportive care Delirium precautions  AKI on CKD 4 AKI likely hemodynamically mediated Creatinine slowly improving and close to baseline Follow electrolytes periodically  Normocytic anemia Due to critical illness/CKD/significant ecchymosis/bruising No evidence of GI bleeding S/p 4 units of PRBC so far-last on 9/10 Follow CBC closely.  CAD s/p PCI May 2024 On aspirin/Brilinta/statin/beta-blocker Thankfully no chest pain/anginal symptoms  Subacute CVA Workup as above Neurology recommending aspirin/Brilinta (as patient had CVA on aspirin/Plavix)  HTN BP stable Continue metoprolol Amlodipine/Imdur/hydralazine on hold-slowly resume when able  Seizure disorder Keppra  GERD PPI  Mood disorder/depression Overall stable On Remeron  Oropharyngeal dysphagia 2/2 acute illness/debility/deconditioning NG feedings in place-nocturnal feedings only SLP following-on dysphagia 3 diet. Improvement in appetite-ate significant chunk of her meals yesterday-will see how she does with breakfast but suspect we could remove NG tube today.  Per prior note-IR PEG tube not  possible due to ventral hernia-due to cardiac issues-aspirin/Brilinta-difficult for surgical PEG.    11 mm right  thyroid nodule Per radiology not clinically significant-no follow-up imaging recommended  Nutrition Status: Nutrition Problem: Inadequate oral intake Etiology: inability to eat Signs/Symptoms: NPO status Interventions: Hormel Shake, Tube feeding, Other (Comment), Calorie Count (banatrol)  BMI: Estimated body mass index is 32.22 kg/m as calculated from the following:   Height as of this encounter: 5\' 3"  (1.6 m).   Weight as of this encounter: 82.5 kg.   Code status:   Code Status: Full Code   DVT Prophylaxis: heparin injection 5,000 Units Start: 06/20/23 1800 Place and maintain sequential compression device Start: 06/18/23 1419 SCDs Start: 06/04/23 0753   Family Communication: Sayuri Hirabayashi [phone] on 9/13    Disposition Plan: Status is: Inpatient Remains inpatient appropriate because: Severity of illness   Planned Discharge Destination:SNF over the next several days-if NG tube can be removed today.   Diet: Diet Order             DIET DYS 3 Room service appropriate? Yes with Assist; Fluid consistency: Nectar Thick  Diet effective now                     Antimicrobial agents: Anti-infectives (From admission, onward)    Start     Dose/Rate Route Frequency Ordered Stop   06/04/23 0830  cefTRIAXone (ROCEPHIN) 2 g in sodium chloride 0.9 % 100 mL IVPB        2 g 200 mL/hr over 30 Minutes Intravenous Every 24 hours 06/04/23 0829 06/10/23 0935   06/04/23 0800  doxycycline (VIBRAMYCIN) 100 mg in sodium chloride 0.9 % 250 mL IVPB  Status:  Discontinued        100 mg 125 mL/hr over 120 Minutes Intravenous Every 12 hours 06/04/23 0735 06/05/23 1000   06/04/23 0645  vancomycin (VANCOCIN) IVPB 1000 mg/200 mL premix  Status:  Discontinued        1,000 mg 200 mL/hr over 60 Minutes Intravenous  Once 06/04/23 0630 06/04/23 0631   06/04/23 0645  ceFEPIme (MAXIPIME) 2 g in sodium chloride 0.9 % 100 mL IVPB        2 g 200 mL/hr over 30 Minutes Intravenous  Once  06/04/23 0630 06/04/23 0729   06/04/23 0645  vancomycin (VANCOREADY) IVPB 1250 mg/250 mL        1,250 mg 166.7 mL/hr over 90 Minutes Intravenous  Once 06/04/23 0631 06/04/23 0949        MEDICATIONS: Scheduled Meds:  aspirin  81 mg Oral Daily   atorvastatin  40 mg Oral Daily   Chlorhexidine Gluconate Cloth  6 each Topical Daily   feeding supplement  237 mL Oral TID WC   fiber supplement (BANATROL TF)  60 mL Oral TID   Gerhardt's butt cream   Topical QID   heparin injection (subcutaneous)  5,000 Units Subcutaneous Q8H   levETIRAcetam  500 mg Oral BID   lipase/protease/amylase  24,000 Units Oral TID AC   melatonin  3 mg Oral QHS   metoprolol tartrate  100 mg Oral BID   mirtazapine  15 mg Oral QHS   mouth rinse  15 mL Mouth Rinse 4 times per day   pantoprazole  40 mg Oral Daily   ticagrelor  90 mg Oral BID   Vivonex RTF  1,000 mL Per Tube Q24H   Continuous Infusions:  sodium chloride Stopped (06/13/23 0636)   PRN Meds:.acetaminophen, docusate, hydrALAZINE, ipratropium-albuterol, loperamide, metoprolol  tartrate, ondansetron (ZOFRAN) IV, mouth rinse, oxyCODONE-acetaminophen, polyethylene glycol, senna-docusate, traZODone   I have personally reviewed following labs and imaging studies  LABORATORY DATA: CBC: Recent Labs  Lab 06/24/23 0342 06/25/23 0627 06/26/23 0322 06/27/23 0711 06/29/23 0937  WBC 6.4 8.8 7.0 17.9* 10.4  HGB 7.2* 10.5* 9.0* 9.5* 9.0*  HCT 23.3* 31.8* 27.7* 28.6* 27.7*  MCV 95.5 90.3 93.6 91.4 95.8  PLT 254 247 204 207 197    Basic Metabolic Panel: Recent Labs  Lab 06/27/23 0711 06/29/23 0937  NA 135 133*  K 3.8 4.5  CL 107 106  CO2 18* 17*  GLUCOSE 144* 155*  BUN 36* 32*  CREATININE 1.49* 1.40*  CALCIUM 8.7* 8.5*  MG  --  1.6*    GFR: Estimated Creatinine Clearance: 34.8 mL/min (A) (by C-G formula based on SCr of 1.4 mg/dL (H)).  Liver Function Tests: No results for input(s): "AST", "ALT", "ALKPHOS", "BILITOT", "PROT", "ALBUMIN" in the  last 168 hours. No results for input(s): "LIPASE", "AMYLASE" in the last 168 hours. No results for input(s): "AMMONIA" in the last 168 hours.  Coagulation Profile: No results for input(s): "INR", "PROTIME" in the last 168 hours.  Cardiac Enzymes: No results for input(s): "CKTOTAL", "CKMB", "CKMBINDEX", "TROPONINI" in the last 168 hours.  BNP (last 3 results) No results for input(s): "PROBNP" in the last 8760 hours.  Lipid Profile: No results for input(s): "CHOL", "HDL", "LDLCALC", "TRIG", "CHOLHDL", "LDLDIRECT" in the last 72 hours.   Thyroid Function Tests: No results for input(s): "TSH", "T4TOTAL", "FREET4", "T3FREE", "THYROIDAB" in the last 72 hours.  Anemia Panel: No results for input(s): "VITAMINB12", "FOLATE", "FERRITIN", "TIBC", "IRON", "RETICCTPCT" in the last 72 hours.  Urine analysis:    Component Value Date/Time   COLORURINE AMBER (A) 04/15/2023 2010   APPEARANCEUR CLOUDY (A) 04/15/2023 2010   LABSPEC 1.017 04/15/2023 2010   PHURINE 5.0 04/15/2023 2010   GLUCOSEU NEGATIVE 04/15/2023 2010   HGBUR LARGE (A) 04/15/2023 2010   BILIRUBINUR NEGATIVE 04/15/2023 2010   KETONESUR NEGATIVE 04/15/2023 2010   PROTEINUR 100 (A) 04/15/2023 2010   NITRITE NEGATIVE 04/15/2023 2010   LEUKOCYTESUR LARGE (A) 04/15/2023 2010    Sepsis Labs: Lactic Acid, Venous    Component Value Date/Time   LATICACIDVEN 1.3 06/05/2023 1650    MICROBIOLOGY: No results found for this or any previous visit (from the past 240 hour(s)).   RADIOLOGY STUDIES/RESULTS: No results found.   LOS: 26 days   Jeoffrey Massed, MD  [facility]    To contact the attending provider between 7A-7P or the covering provider during after hours 7P-7A, please log into the web site [email or web address] and access using universal [place] password for that web site. If you do not have the password, please call the hospital operator.  06/30/2023, 9:54 AM

## 2023-06-30 NOTE — Plan of Care (Signed)

## 2023-06-30 NOTE — Progress Notes (Signed)
Patient refused night time medications.  Notified Dennard Nip, DO. Primary RN educated patient on the indication and outcomes of medication refusal. Patient alert x4 and verbalized understanding.

## 2023-06-30 NOTE — Plan of Care (Signed)

## 2023-06-30 NOTE — Progress Notes (Signed)
Nutrition Follow-up  DOCUMENTATION CODES:   Obesity unspecified  INTERVENTION:  Continue with current nutr poc   NUTRITION DIAGNOSIS:   Increased nutrient needs related to acute illness as evidenced by estimated needs.    GOAL:   Patient will meet greater than or equal to 90% of their needs    MONITOR:   PO intake, Supplement acceptance, Diet advancement, Labs, Weight trends, TF tolerance, I & O's  REASON FOR ASSESSMENT:   Consult Calorie Count  ASSESSMENT: .  77 year old female who presented to the ED on 8/21 with respiratory distress. PMH of recurrent pneumonias, vitamin B12 deficiency, stroke, seizures, HTN, GERD, DM, DVT, chronic pancreatitis, CAD, colostomy. Pt admitted with pneumonia, sepsis. Pt had been NG dependent with noted removal to day. Current meal intake 50-75% average. Suspect current intake with supplements providing adequate nutrition.   Dinner 9/14: 230 kcal, 19 g protein  Breakfast 9/15: 253 kcals, 15g protein Lunch 9/15:  400 kcal 22 2 protein Breakfast 9/16 pending Supplements: 1 Ensure = 350 kcals, 20g protein   Meds;   NUTRITION - FOCUSED PHYSICAL EXAM:  Flowsheet Row Most Recent Value  Orbital Region No depletion  Upper Arm Region No depletion  Thoracic and Lumbar Region No depletion  Buccal Region Unable to assess  Temple Region No depletion  Clavicle Bone Region Mild depletion  Clavicle and Acromion Bone Region Mild depletion  Scapular Bone Region No depletion  Dorsal Hand No depletion  Patellar Region No depletion  Anterior Thigh Region Mild depletion  Posterior Calf Region Mild depletion  Edema (RD Assessment) None  Hair Reviewed  Eyes Reviewed  Mouth Reviewed  Skin Reviewed  Nails Reviewed       Diet Order:   Diet Order             DIET DYS 3 Room service appropriate? Yes with Assist; Fluid consistency: Nectar Thick  Diet effective now                   EDUCATION NEEDS:   Not appropriate for education at  this time  Skin:  Skin Assessment: Reviewed RN Assessment (skin tear buttocks)  Last BM:  06/20/23  Height:   Ht Readings from Last 1 Encounters:  06/04/23 5\' 3"  (1.6 m)    Weight:   Wt Readings from Last 1 Encounters:  06/30/23 82.5 kg    Ideal Body Weight:  52.3 kg  BMI:  Body mass index is 32.22 kg/m.  Estimated Nutritional Needs:   Kcal:  1650-1850  Protein:  85-100 grams  Fluid:  1.6-1.8 L    Ricardo Jericho, RDN, LDN

## 2023-07-01 ENCOUNTER — Inpatient Hospital Stay (HOSPITAL_COMMUNITY): Payer: Medicare HMO

## 2023-07-01 DIAGNOSIS — J9601 Acute respiratory failure with hypoxia: Secondary | ICD-10-CM | POA: Diagnosis not present

## 2023-07-01 DIAGNOSIS — Z955 Presence of coronary angioplasty implant and graft: Secondary | ICD-10-CM

## 2023-07-01 DIAGNOSIS — D649 Anemia, unspecified: Secondary | ICD-10-CM | POA: Diagnosis not present

## 2023-07-01 DIAGNOSIS — K922 Gastrointestinal hemorrhage, unspecified: Secondary | ICD-10-CM | POA: Diagnosis not present

## 2023-07-01 DIAGNOSIS — Z9861 Coronary angioplasty status: Secondary | ICD-10-CM

## 2023-07-01 DIAGNOSIS — Z8673 Personal history of transient ischemic attack (TIA), and cerebral infarction without residual deficits: Secondary | ICD-10-CM | POA: Diagnosis not present

## 2023-07-01 DIAGNOSIS — I2510 Atherosclerotic heart disease of native coronary artery without angina pectoris: Secondary | ICD-10-CM

## 2023-07-01 LAB — BASIC METABOLIC PANEL
Anion gap: 5 (ref 5–15)
BUN: 28 mg/dL — ABNORMAL HIGH (ref 8–23)
CO2: 20 mmol/L — ABNORMAL LOW (ref 22–32)
Calcium: 8.3 mg/dL — ABNORMAL LOW (ref 8.9–10.3)
Chloride: 108 mmol/L (ref 98–111)
Creatinine, Ser: 1.7 mg/dL — ABNORMAL HIGH (ref 0.44–1.00)
GFR, Estimated: 31 mL/min — ABNORMAL LOW (ref >60)
Glucose, Bld: 125 mg/dL — ABNORMAL HIGH (ref 70–99)
Potassium: 4.2 mmol/L (ref 3.5–5.1)
Sodium: 133 mmol/L — ABNORMAL LOW (ref 135–145)

## 2023-07-01 LAB — CBC
HCT: 28.1 % — ABNORMAL LOW (ref 36.0–46.0)
HCT: 25.4 % — ABNORMAL LOW (ref 36.0–46.0)
Hemoglobin: 8.8 g/dL — ABNORMAL LOW (ref 12.0–15.0)
Hemoglobin: 8.1 g/dL — ABNORMAL LOW (ref 12.0–15.0)
MCH: 29.5 pg (ref 26.0–34.0)
MCH: 30.1 pg (ref 26.0–34.0)
MCHC: 31.3 g/dL (ref 30.0–36.0)
MCHC: 31.9 g/dL (ref 30.0–36.0)
MCV: 94.3 fL (ref 80.0–100.0)
MCV: 94.4 fL (ref 80.0–100.0)
Platelets: 225 10*3/uL (ref 150–400)
Platelets: 232 10*3/uL (ref 150–400)
RBC: 2.98 MIL/uL — ABNORMAL LOW (ref 3.87–5.11)
RBC: 2.69 MIL/uL — ABNORMAL LOW (ref 3.87–5.11)
RDW: 15.7 % — ABNORMAL HIGH (ref 11.5–15.5)
RDW: 15.7 % — ABNORMAL HIGH (ref 11.5–15.5)
WBC: 16.6 10*3/uL — ABNORMAL HIGH (ref 4.0–10.5)
WBC: 16.6 10*3/uL — ABNORMAL HIGH (ref 4.0–10.5)
nRBC: 0 % (ref 0.0–0.2)
nRBC: 0.1 % (ref 0.0–0.2)

## 2023-07-01 LAB — HEMOGLOBIN AND HEMATOCRIT, BLOOD
HCT: 27 % — ABNORMAL LOW (ref 36.0–46.0)
HCT: 23.7 % — ABNORMAL LOW (ref 36.0–46.0)
Hemoglobin: 8.8 g/dL — ABNORMAL LOW (ref 12.0–15.0)
Hemoglobin: 7.6 g/dL — ABNORMAL LOW (ref 12.0–15.0)

## 2023-07-01 LAB — GLUCOSE, CAPILLARY
Glucose-Capillary: 102 mg/dL — ABNORMAL HIGH (ref 70–99)
Glucose-Capillary: 102 mg/dL — ABNORMAL HIGH (ref 70–99)
Glucose-Capillary: 103 mg/dL — ABNORMAL HIGH (ref 70–99)
Glucose-Capillary: 146 mg/dL — ABNORMAL HIGH (ref 70–99)
Glucose-Capillary: 162 mg/dL — ABNORMAL HIGH (ref 70–99)
Glucose-Capillary: 80 mg/dL (ref 70–99)

## 2023-07-01 LAB — TYPE AND SCREEN
ABO/RH(D): A POS
Antibody Screen: NEGATIVE

## 2023-07-01 LAB — MAGNESIUM: Magnesium: 1.7 mg/dL (ref 1.7–2.4)

## 2023-07-01 MED ORDER — PANTOPRAZOLE SODIUM 40 MG IV SOLR
40.0000 mg | INTRAVENOUS | Status: DC
  Administered 2023-07-02 – 2023-07-03 (×2): 40 mg via INTRAVENOUS
  Filled 2023-07-01 (×2): qty 10

## 2023-07-01 NOTE — Progress Notes (Signed)
Speech Language Pathology Treatment: Dysphagia  Patient Details Name: [name] MRN: [id number] DOB: 1946/03/06 Today's Date: 07/01/2023 Time: 4098-1191 SLP Time Calculation (min) (ACUTE ONLY): 9 min  Assessment / Plan / Recommendation Clinical Impression  Treatment focused on readiness for repeat instrumental assessment. Patient alert and upright in bed upon arrival, requesting ice chips from CNA. SLP provided diagnostic trials of ice chips and thin liquids via cup sip. Patient with good oral containment of liquids, swift appearing oral transit of bolus, and overall good airway protection with the exception of one time cough post swallow out of 4-6 ounces of thin liquid via small cup sips. Note that during previous MBS (9/2), aspiration was silent in nature. Educated patient on recommendations to continue thickened liquids until MBS could be complete, likely next date, given silent nature of aspiration. Overall, feel patient is ready for repeat instrumental testing.    HPI HPI: Pt is a 77 yo female presenting 8/21 with shortness of breath, cough, hypoxia. ETT 8/21-8/28. Pt was seen by SLP most recently in July 2023, recommending regular solids and thin liquids but noting lingual abnormalities and reduced ROM secondary to lingual trauma). Esophagram (November 2021) showed penetration of thin liquids, sensed aspiration with thicker barium, mild esophageal dysmotility, and HH with mild narrowing of the GE junction.   FEES at outside hospital in 2019 [facility]. Imaging from [facility] in 2019 reviewed (report not available) with transient penetration and vallecular residue noted. PMH includes: GERD, stroke, recurrent PNA, seizures, sleep apnea, DM, HTN, DVT, B12 deficiency, CAD, colostomy, chronic pancreatitis      SLP Plan  MBS      Recommendations for follow up therapy are one component of a multi-disciplinary discharge planning process, led by the attending physician.  Recommendations may be updated based  on patient status, additional functional criteria and insurance authorization.    Recommendations  Diet recommendations: Dysphagia 3 (mechanical soft);Nectar-thick liquid Liquids provided via: Cup;Straw Medication Administration: Whole meds with puree Supervision: Patient able to self feed Compensations: Slow rate;Small sips/bites Postural Changes and/or Swallow Maneuvers: Seated upright 90 degrees;Upright 30-60 min after meal                  Oral care BID   Frequent or constant Supervision/Assistance Dysphagia, unspecified (R13.10)     MBS    Joanna Barefoot MA, CCC-SLP  [name] [name]  07/01/2023, 10:21 AM

## 2023-07-01 NOTE — Progress Notes (Signed)
Physical Therapy Treatment Patient Details Name: [name] MRN: [id number] DOB: 02/23/1946 Today's Date: 07/01/2023   History of Present Illness Pt is 77 year old presented to [facility] on  06/04/23 for acute respiratory failure due to PNA. Intubatd on admission. Extubated 8/28. Pt encephaloathic post extubation. 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    PT Comments  Pt greeted resting in bed and agreeable to session with steady progress towards acute goals. Pt requiring encouragement for participation and able to progress all mobility this session, needing less assist throughout. Pt requiring grossly supervision for bed mobility, and CGA for transfers and short gait in room with RW for support. Pt declining further gait trials due to fatigue. Current plan remains appropriate to address deficits and maximize functional independence and decrease caregiver burden. Pt continues to benefit from skilled PT services to progress toward functional mobility goals.     If plan is discharge home, recommend the following: A lot of help with bathing/dressing/bathroom;Assist for transportation;Help with stairs or ramp for entrance;Two people to help with walking and/or transfers;Assistance with cooking/housework;Supervision due to cognitive status;Direct supervision/assist for medications management;Direct supervision/assist for financial management   Can travel by private vehicle     No  Equipment Recommendations  Other (comment) (defer to next venue of care)    Recommendations for Other  Services       Precautions / Restrictions Precautions Precautions: Fall Restrictions Weight Bearing Restrictions: No     Mobility  Bed Mobility Overal bed mobility: Needs Assistance Bed Mobility: Supine to Sit     Supine to sit: Supervision     General bed mobility comments: supervision for safety, no physical assist needed    Transfers Overall transfer level: Needs assistance Equipment used: Rolling walker (2 wheels) Transfers: Sit to/from Stand, Bed to chair/wheelchair/BSC Sit to Stand: Contact guard assist   Step pivot transfers: Contact guard assist       General transfer comment: CGA for safety    Ambulation/Gait Ambulation/Gait assistance: Contact guard assist Gait Distance (Feet): 8 Feet Assistive device: Rolling walker (2 wheels), 1 person hand held assist Gait Pattern/deviations: Step-through pattern, Decreased stride length Gait velocity: decr     General Gait Details: 8' forward then 8' back to chair, pt declining further gait due to fatigue, no LOB, steady steps   Stairs             Wheelchair Mobility     Tilt Bed    Modified Rankin (Stroke Patients Only) Modified Rankin (Stroke Patients Only) Pre-Morbid Rankin Score: Slight disability Modified Rankin: Moderately severe disability     Balance Overall balance assessment: Needs assistance Sitting-balance support: No upper extremity supported, Single extremity supported Sitting balance-Leahy Scale: Fair     Standing balance support: Bilateral upper extremity supported Standing balance-Leahy Scale: Poor                              Cognition Arousal: Alert Behavior During Therapy: Flat affect Overall Cognitive Status: Impaired/Different from baseline  Memory: Decreased recall of precautions, Decreased short-term memory       Problem Solving: Slow processing, Requires verbal cues General Comments: Pt required encouragement to  participate.        Exercises General Exercises - Lower Extremity Hip ABduction/ADduction: AROM, Left, Right, 20 reps, Seated    General Comments General comments (skin integrity, edema, etc.): VSS on RA      Pertinent Vitals/Pain Pain Assessment Pain Assessment: No/denies pain Pain Intervention(s): Monitored during session    Home Living                          Prior Function            PT Goals (current goals can now be found in the care plan section) Acute Rehab PT Goals Patient Stated Goal: go home PT Goal Formulation: With patient Time For Goal Achievement: 06/29/23 Progress towards PT goals: Progressing toward goals    Frequency    Min 1X/week      PT Plan      Co-evaluation              AM-PAC PT "6 Clicks" Mobility   Outcome Measure  Help needed turning from your back to your side while in a flat bed without using bedrails?: A Little Help needed moving from lying on your back to sitting on the side of a flat bed without using bedrails?: A Lot Help needed moving to and from a bed to a chair (including a wheelchair)?: A Little Help needed standing up from a chair using your arms (e.g., wheelchair or bedside chair)?: A Little Help needed to walk in hospital room?: Total (<10') Help needed climbing 3-5 steps with a railing? : Total 6 Click Score: 13    End of Session   Activity Tolerance: Patient limited by fatigue Patient left: with call bell/phone within reach;in chair;with chair alarm set Nurse Communication: Mobility status PT Visit Diagnosis: Unsteadiness on feet (R26.81);Other abnormalities of gait and mobility (R26.89);Muscle weakness (generalized) (M62.81)     Time: 1914-7829 PT Time Calculation (min) (ACUTE ONLY): 18 min  Charges:    $Therapeutic Activity: 8-22 mins PT General Charges $$ ACUTE PT VISIT: 1 Visit                     Maclin Guerrette R. PTA Acute Rehabilitation Services Office: [phone]   Catalina Antigua 07/01/2023, 4:10 PM

## 2023-07-01 NOTE — Significant Event (Addendum)
RN reports patient with medium amount of BRB in stool x1 episode.  CBC was just drawn at 0621 and pending Will hold ASA, brilinta, and heparin Atkins Getting type and screen

## 2023-07-01 NOTE — Progress Notes (Signed)
PROGRESS NOTE        PATIENT DETAILS Name: [name] Age: 77 y.o. Sex: female Date of Birth: [birth year] Admit Date: 06/04/2023 Admitting Physician Steffanie Dunn, DO QMV:[id number], Barry Dienes, MD  Brief Summary: Patient is a 77 y.o.  female with history of CVA, seizure, HTN, CAD s/p PCI May 2024, CKD 4, seizure disorder-presented to the ED on 8/21 with shortness of breath- found to have severe hypoxia-initially required BiPAP but subsequently intubated-admitted to the ICU-stabilized-extubated on 8/28-subsequently developed severe encephalopathy, found to have CVA, oropharyngeal dysphagia requiring NG tube feeds.  See below for further details.  Significant events: 8/21>> admitted, intubated 8/28>>extubated 9/02>> transferred to [facility] 9/07>> MRI significant for subacute CVA 9/16>>Cortrak tube removed 9/17>> hematochezia-GI/cardiology consult-DAPT stopped.  Significant studies: 8/21>> CXR: Multilobar right-sided PNA 8/21>> CT chest: Large airspace opacity involving right upper/lower lobes 8/30>> Echo: EF 60-65% 9/07>> MRI brain: Subacute infarct left parietal lobe 9/08>> MRI brain: Chronic occlusion left ICA 9/09>> carotid Doppler: Known chronic left ICA occlusion,<50% right carotid. 9/09>> LDL: 27 9/09>> A1c: 5.9  Significant microbiology data: 8/21>> COVID/influenza/RSV PCR: Negative 8/21>> respiratory virus panel: Negative 8/21>> tracheal aspirate: Negative 8/21>> blood culture: Negative 9/03>> C. difficile studies: Negative  Procedures: 8/23>>Cortrak  Consults: PCCM Neuro Cardiology GI  Subjective: Developed hematochezia-painless-x 2 this morning.  Objective: Vitals: Blood pressure (!) 117/96, pulse 76, temperature 97.8 F (36.6 C), temperature source Oral, resp. rate 17, height 5\' 3"  (1.6 m), weight 82.5 kg, SpO2 98%.   Exam: Gen Exam:Alert awake-not in any distress HEENT:atraumatic, normocephalic Chest: B/L clear to auscultation  anteriorly CVS:S1S2 regular Abdomen:soft non tender, non distended Extremities:no edema Neurology: Non focal Skin: no rash  Pertinent Labs/Radiology:    Latest Ref Rng & Units 07/01/2023    6:21 AM 06/29/2023    9:37 AM 06/27/2023    7:11 AM  CBC  WBC 4.0 - 10.5 K/uL 16.6  10.4  17.9   Hemoglobin 12.0 - 15.0 g/dL 8.8  9.0  9.5   Hematocrit 36.0 - 46.0 % 28.1  27.7  28.6   Platelets 150 - 400 K/uL 225  197  207     Lab Results  Component Value Date   NA 133 (L) 07/01/2023   K 4.2 07/01/2023   CL 108 07/01/2023   CO2 20 (L) 07/01/2023      Assessment/Plan: Severe sepsis secondary to pneumonia (POA) Acute hypoxic respiratory failure Overall improved Completed a course of antibiotics Supportive care  Acute metabolic encephalopathy Slowly improving Supportive care Delirium precautions  AKI on CKD 4 AKI likely hemodynamically mediated Creatinine slowly improving and close to baseline Follow electrolytes periodically  Normocytic anemia Due to critical illness/CKD/significant ecchymosis/bruising No evidence of GI bleeding S/p 4 units of PRBC so far-last on 9/10 Follow CBC closely.  GI bleeding Occurred on 9/17 X 2 episodes already this morning Likely diverticular Frequent CBC Type and screen GI consulted  CAD s/p PCI May 2024 Due to hematochezia-DAPT stopped this morning Continue statin/beta-blocker No anginal symptoms Cardiology opinion regarding DAPT given PCI May 2024.  Subacute CVA Workup as above Neuro recommending aspirin/Brilinta (as patient had CVA while on aspirin/Plavix)-however both agents now on hold given hematochezia.   HTN BP stable Continue metoprolol Amlodipine/Imdur/hydralazine on hold-slowly resume when able  Seizure disorder Keppra  GERD PPI  Mood disorder/depression Overall stable On Remeron  Oropharyngeal dysphagia 2/2 acute  illness/debility/deconditioning Required NG-tube feedings-NG tube was discontinued on 9/16-as  oral intake significantly improved Continue dysphagia 3 diet-SLP following  Per prior note-IR PEG tube not possible due to ventral hernia-due to cardiac issues-aspirin/Brilinta-difficult for surgical PEG.    11 mm right thyroid nodule Per radiology not clinically significant-no follow-up imaging recommended  Nutrition Status: Nutrition Problem: Increased nutrient needs Etiology: acute illness Signs/Symptoms: estimated needs Interventions: Hormel Shake, Tube feeding, Other (Comment), Calorie Count (banatrol)  BMI: Estimated body mass index is 32.22 kg/m as calculated from the following:   Height as of this encounter: 5\' 3"  (1.6 m).   Weight as of this encounter: 82.5 kg.   Code status:   Code Status: Full Code   DVT Prophylaxis: Place and maintain sequential compression device Start: 06/18/23 1419 SCDs Start: 06/04/23 0753   Family Communication: Doren Lamotte [phone] on 9/17   Disposition Plan: Status is: Inpatient Remains inpatient appropriate because: Severity of illness   Planned Discharge Destination:SNF over the next several days-if NG tube can be removed today.   Diet: Diet Order             Diet NPO time specified  Diet effective now                     Antimicrobial agents: Anti-infectives (From admission, onward)    Start     Dose/Rate Route Frequency Ordered Stop   06/04/23 0830  cefTRIAXone (ROCEPHIN) 2 g in sodium chloride 0.9 % 100 mL IVPB        2 g 200 mL/hr over 30 Minutes Intravenous Every 24 hours 06/04/23 0829 06/10/23 0935   06/04/23 0800  doxycycline (VIBRAMYCIN) 100 mg in sodium chloride 0.9 % 250 mL IVPB  Status:  Discontinued        100 mg 125 mL/hr over 120 Minutes Intravenous Every 12 hours 06/04/23 0735 06/05/23 1000   06/04/23 0645  vancomycin (VANCOCIN) IVPB 1000 mg/200 mL premix  Status:  Discontinued        1,000 mg 200 mL/hr over 60 Minutes Intravenous  Once 06/04/23 0630 06/04/23 0631   06/04/23 0645   ceFEPIme (MAXIPIME) 2 g in sodium chloride 0.9 % 100 mL IVPB        2 g 200 mL/hr over 30 Minutes Intravenous  Once 06/04/23 0630 06/04/23 0729   06/04/23 0645  vancomycin (VANCOREADY) IVPB 1250 mg/250 mL        1,250 mg 166.7 mL/hr over 90 Minutes Intravenous  Once 06/04/23 0631 06/04/23 0949        MEDICATIONS: Scheduled Meds:  atorvastatin  40 mg Oral Daily   Chlorhexidine Gluconate Cloth  6 each Topical Daily   feeding supplement  237 mL Oral TID WC   fiber supplement (BANATROL TF)  60 mL Oral TID   Gerhardt's butt cream   Topical QID   levETIRAcetam  500 mg Oral BID   lipase/protease/amylase  24,000 Units Oral TID AC   melatonin  3 mg Oral QHS   metoprolol tartrate  100 mg Oral BID   mirtazapine  15 mg Oral QHS   mouth rinse  15 mL Mouth Rinse 4 times per day   pantoprazole (PROTONIX) IV  40 mg Intravenous Q24H   Vivonex RTF  1,000 mL Per Tube Q24H   Continuous Infusions:  sodium chloride Stopped (06/13/23 0636)   PRN Meds:.acetaminophen, docusate, hydrALAZINE, ipratropium-albuterol, loperamide, metoprolol tartrate, ondansetron (ZOFRAN) IV, mouth rinse, oxyCODONE-acetaminophen, polyethylene glycol, senna-docusate, traZODone   I have personally reviewed  following labs and imaging studies  LABORATORY DATA: CBC: Recent Labs  Lab 06/25/23 0627 06/26/23 0322 06/27/23 0711 06/29/23 0937 07/01/23 0621  WBC 8.8 7.0 17.9* 10.4 16.6*  HGB 10.5* 9.0* 9.5* 9.0* 8.8*  HCT 31.8* 27.7* 28.6* 27.7* 28.1*  MCV 90.3 93.6 91.4 95.8 94.3  PLT 247 204 207 197 225    Basic Metabolic Panel: Recent Labs  Lab 06/27/23 0711 06/29/23 0937 07/01/23 0621  NA 135 133* 133*  K 3.8 4.5 4.2  CL 107 106 108  CO2 18* 17* 20*  GLUCOSE 144* 155* 125*  BUN 36* 32* 28*  CREATININE 1.49* 1.40* 1.70*  CALCIUM 8.7* 8.5* 8.3*  MG  --  1.6* 1.7    GFR: Estimated Creatinine Clearance: 28.6 mL/min (A) (by C-G formula based on SCr of 1.7 mg/dL (H)).  Liver Function Tests: No results for  input(s): "AST", "ALT", "ALKPHOS", "BILITOT", "PROT", "ALBUMIN" in the last 168 hours. No results for input(s): "LIPASE", "AMYLASE" in the last 168 hours. No results for input(s): "AMMONIA" in the last 168 hours.  Coagulation Profile: No results for input(s): "INR", "PROTIME" in the last 168 hours.  Cardiac Enzymes: No results for input(s): "CKTOTAL", "CKMB", "CKMBINDEX", "TROPONINI" in the last 168 hours.  BNP (last 3 results) No results for input(s): "PROBNP" in the last 8760 hours.  Lipid Profile: No results for input(s): "CHOL", "HDL", "LDLCALC", "TRIG", "CHOLHDL", "LDLDIRECT" in the last 72 hours.   Thyroid Function Tests: No results for input(s): "TSH", "T4TOTAL", "FREET4", "T3FREE", "THYROIDAB" in the last 72 hours.  Anemia Panel: No results for input(s): "VITAMINB12", "FOLATE", "FERRITIN", "TIBC", "IRON", "RETICCTPCT" in the last 72 hours.  Urine analysis:    Component Value Date/Time   COLORURINE AMBER (A) 04/15/2023 2010   APPEARANCEUR CLOUDY (A) 04/15/2023 2010   LABSPEC 1.017 04/15/2023 2010   PHURINE 5.0 04/15/2023 2010   GLUCOSEU NEGATIVE 04/15/2023 2010   HGBUR LARGE (A) 04/15/2023 2010   BILIRUBINUR NEGATIVE 04/15/2023 2010   KETONESUR NEGATIVE 04/15/2023 2010   PROTEINUR 100 (A) 04/15/2023 2010   NITRITE NEGATIVE 04/15/2023 2010   LEUKOCYTESUR LARGE (A) 04/15/2023 2010    Sepsis Labs: Lactic Acid, Venous    Component Value Date/Time   LATICACIDVEN 1.3 06/05/2023 1650    MICROBIOLOGY: No results found for this or any previous visit (from the past 240 hour(s)).   RADIOLOGY STUDIES/RESULTS: No results found.   LOS: 27 days   Jeoffrey Massed, MD  [facility]    To contact the attending provider between 7A-7P or the covering provider during after hours 7P-7A, please log into the web site [email or web address] and access using universal [place] password for that web site. If you do not have the password, please call the hospital  operator.  07/01/2023, 11:11 AM

## 2023-07-01 NOTE — Consult Note (Addendum)
Cardiology Consultation   Patient ID: NEBULA PORTEN MRN: [id number]; DOB: [birth year]  Admit date: 06/04/2023 Date of Consult: 07/01/2023  PCP:  Garlan Fillers, MD   [facility] Providers Cardiologist:  Peter Swaziland, MD   Patient Profile:   [name] is a 77 y.o. female with a hx of CAD, carotid artery disease with known occlusion of the left ICA, CKD 3-4, DM2, diabetes insipidus, dizziness, hypertension, GERD, chronic pancreatitis on enzymes, CVA, PAD with stent to the left SFA, and OSA who is being seen 07/01/2023 for the evaluation of antiplatelet therapy with recent PCI now in the setting of acute GI bleed at the request of Dr. Jerral Ralph.  History of Present Illness:   Ms. Rourk has a known history of CAD with 60-70% stenosis in the mid LAD.  She has had several heart catheterizations in 1999, 2002, 2006, and 2012 with stable anatomy.  Nuclear stress test 2020 with normal perfusion.  LVEF is known to be 45-50% by Myoview and echocardiogram.    She underwent ex lap, lysis of adhesions, end ileostomy takedown, small bowel resection and ileocolonic anastomoses on 03/23/2021 in [place].  She was admitted 04/14/2022 with strokelike symptoms in her PCPs office.  Imaging was initially unremarkable however CTA and transcranial Doppler showed a string sign concerning for eminent vascular compromise prompting vascular consultation and subsequent right TCAR 04/18/2022.  She was discharged on aspirin and Plavix.  She was admitted 01/2023 with chest pain and underwent heart catheterization that showed critical ostial RCA stenosis with heavy calcification.  Due to her CKD she returned for staged PCI and IVUS guided orbital atherectomy with DES 3.5 x 16 mm in the ostial RCA.  She was continued on aspirin and Plavix.  Unfortunately she was hospitalized in early July 2024 with altered mental status after being found down in her home covered in urine and feces.  She was admitted and  treated for rhabdomyolysis, AKI, UTI, and pneumonia.  She was severely dehydrated which was treated and she received antibiotics.  CK was nearly 3000.  Aspiration pneumonia and UTI was treated with p.o. antibiotics after discharge.  Creatinine at discharge was 2.13, losartan and SGLT2 inhibitor were held.  I saw her in follow-up clinic 05/21/2023 and she reported ongoing diarrhea - chronic since her small bowel resection.  Unfortunately she was hospitalized 06/04/2023 with respiratory failure requiring intubation in the ER, severe sepsis secondary to, lactic acidosis.  She was extubated on 06/11/2023 but developed severe encephalopathy found to have acute CVA 06/21/23 and oropharyngeal dysphagia requiring NG tube feeds, coretrak now removed. Following CVA, she was transitioned from plavix to brilinta, continued on ASA and SQ heparin. Neurology felt ASA and plavix had failed in the setting of acute CVA.   Today, 07/01/23, she was noted to have hematochezia and DAPT was discontinued. GI consulted, cardiology consulted for recommendations on DAPT following PCI in 02/12/23.   GI is not currently planning for repeat colonoscopy given her anatomy. Conservative management with DAPT washout, monitor bleeding.   During my interview, she reports that prior to admission she had stable chronic dyspnea on exertion and no chest pain. Today, she thinks the lower GI bleeding has stopped, will need to monitor. No current angina.    Past Medical History:  Diagnosis Date   Anxiety    Arthritis    back    Blind loop syndrome    Clotting disorder (HCC)    Hx Clot in leg per pt  Colostomy present (HCC)    Coronary artery disease    a. known 60-70% mid-LAD stenosis with caths in 1999, 2002, 2006, and 2012 showing stable anatomy b. 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HCC)    Diabetes mellitus    Diverticulosis    Dizziness    chronic   Dyslipidemia    Fatty liver    GERD (gastroesophageal  reflux disease)    hiatial hernia   Heart murmur    Hiatal hernia    Hypertension    Irritable bowel syndrome    Pancreatitis, chronic (HCC)    Peripheral vascular disease (HCC) 02/20/2010   s/p stent of left SFA; carotid stenosis   Seizure (HCC) 11/04/2018   last 1 yr 2020 per pt unsure month    Sleep apnea    no cpap    Stroke (HCC)    Thyroid nodule    Vitamin B12 deficiency     Past Surgical History:  Procedure Laterality Date   ABDOMINAL HYSTERECTOMY     partial   AUGMENTATION MAMMAPLASTY     bilateral 1976 retro    BREAST ENHANCEMENT SURGERY     CARDIAC CATHETERIZATION  07/07/98;08/31/01;03/04/05   60 -70% LAD   CATARACT EXTRACTION Bilateral    COLON RESECTION N/A 10/14/2017   Procedure: EXPLORATORY LAPAROTOMY, EXTENDED RIGHT COLECTOMY; APPLICATION OF ABDOMINAL VACUUM DRESSING;  Surgeon: Griselda Miner, MD;  Location: [facility];  Service: General;  Laterality: N/A;   COLONOSCOPY     CORONARY ATHERECTOMY N/A 02/12/2023   Procedure: CORONARY ATHERECTOMY;  Surgeon: Swaziland, Peter M, MD;  Location: [facility] INVASIVE CV LAB;  Service: Cardiovascular;  Laterality: N/A;   CORONARY STENT INTERVENTION N/A 02/12/2023   Procedure: CORONARY STENT INTERVENTION;  Surgeon: Swaziland, Peter M, MD;  Location: [facility] INVASIVE CV LAB;  Service: Cardiovascular;  Laterality: N/A;   CORONARY ULTRASOUND/IVUS N/A 02/12/2023   Procedure: Coronary Ultrasound/IVUS;  Surgeon: Swaziland, Peter M, MD;  Location: [facility] INVASIVE CV LAB;  Service: Cardiovascular;  Laterality: N/A;   ENDOBRONCHIAL ULTRASOUND Bilateral 02/24/2017   Procedure: ENDOBRONCHIAL ULTRASOUND;  Surgeon: Leslye Peer, MD;  Location: [facility];  Service: Cardiopulmonary;  Laterality: Bilateral;   FEMORAL ARTERY STENT     Left leg   LAPAROTOMY N/A 10/16/2017   Procedure: EXPLORATORY LAPAROTOMY, PARTIAL OMENTECTOMY, RESECTION ISCHEMIC ILEUM 130CM, APPLICATION OF VAC ABDOMINAL DRESSING;  Surgeon: Darnell Level, MD;  Location: [facility];  Service: General;   Laterality: N/A;   LAPAROTOMY N/A 10/18/2017   Procedure: RE-EXPLORATION OF ABDOMEN, ILEOSTOMY CREATION;  Surgeon: Almond Lint, MD;  Location: [facility];  Service: General;  Laterality: N/A;   LEFT HEART CATH AND CORONARY ANGIOGRAPHY N/A 01/29/2023   Procedure: LEFT HEART CATH AND CORONARY ANGIOGRAPHY;  Surgeon: Swaziland, Peter M, MD;  Location: [facility] INVASIVE CV LAB;  Service: Cardiovascular;  Laterality: N/A;   LUNG SURGERY  03/2017   Benign polyps removed   PATELLA REALIGNMENT Left    POLYPECTOMY     SIGMOIDOSCOPY     TONSILLECTOMY     TRANSCAROTID ARTERY REVASCULARIZATION  Right 04/18/2022   Procedure: Transcarotid Artery Revascularization;  Surgeon: Victorino Sparrow, MD;  Location: [facility] OR;  Service: Vascular;  Laterality: Right;     Home Medications:  Prior to Admission medications   Medication Sig Start Date End Date Taking? Authorizing Provider  albuterol (VENTOLIN HFA) 108 (90 Base) MCG/ACT inhaler Inhale 1-2 puffs into the lungs every 6 (six) hours as needed for wheezing or shortness of breath. 03/30/21  Yes [provider]  amLODipine (NORVASC) 10 MG tablet Take 1 tablet (10 mg total) by mouth daily. 04/20/23  Yes Leroy Sea, MD  aspirin 81 MG chewable tablet Chew 1 tablet (81 mg total) by mouth daily. 04/19/22  Yes Tyrone Nine, MD  atorvastatin (LIPITOR) 40 MG tablet Take 1 tablet (40 mg total) by mouth daily. 04/19/22  Yes Tyrone Nine, MD  cloNIDine (CATAPRES) 0.1 MG tablet Take 1 tablet (0.1 mg total) by mouth 2 (two) times daily. 04/20/23  Yes Leroy Sea, MD  clopidogrel (PLAVIX) 75 MG tablet Take 1 tablet (75 mg total) by mouth daily. Patient taking differently: Take 75 mg by mouth every evening. 04/19/22  Yes Tyrone Nine, MD  esomeprazole (NEXIUM) 40 MG capsule Take 40 mg by mouth 2 (two) times daily before a meal. 01/11/20  Yes [provider]  fluticasone (FLONASE) 50 MCG/ACT nasal spray Place 2 sprays into both nostrils daily as needed for allergies.  01/29/16  Yes [provider]  furosemide (LASIX) 20 MG tablet Take 1 tablet (20 mg total) by mouth daily as needed. Patient taking differently: Take 20 mg by mouth every other day. 02/13/23  Yes Perlie Gold, PA-C  HYDROcodone-acetaminophen (NORCO) 10-325 MG tablet Take 1 tablet by mouth every 6 (six) hours as needed for moderate pain or severe pain.   Yes [provider]  isosorbide mononitrate (IMDUR) 60 MG 24 hr tablet TAKE 1 TABLET BY MOUTH EVERY DAY 04/28/23  Yes Swaziland, Peter M, MD  levETIRAcetam (KEPPRA) 500 MG tablet TAKE 1 TABLET BY MOUTH TWICE A DAY Patient taking differently: Take 500 mg by mouth 2 (two) times daily. 01/20/20  Yes Dohmeier, Porfirio Mylar, MD  meclizine (ANTIVERT) 25 MG tablet Take 0.5 tablets (12.5 mg total) by mouth 3 (three) times daily as needed for dizziness. 01/04/23  Yes Leroy Sea, MD  metoprolol succinate (TOPROL-XL) 100 MG 24 hr tablet Take 100 mg by mouth daily. 12/20/21  Yes [provider]  mirtazapine (REMERON) 15 MG tablet Take 15 mg by mouth at bedtime. 07/18/21  Yes [provider]  montelukast (SINGULAIR) 10 MG tablet Take 10 mg by mouth daily. 08/09/18  Yes [provider]  nitroGLYCERIN (NITROSTAT) 0.4 MG SL tablet Place 1 tablet (0.4 mg total) under the tongue every 5 (five) minutes as needed for chest pain. 01/23/23  Yes Swaziland, Peter M, MD  ondansetron (ZOFRAN) 4 MG tablet Take 1 tablet (4 mg total) by mouth every 6 (six) hours as needed for nausea or vomiting. 10/24/21  Yes Esterwood, Amy S, PA-C  Pancrelipase, Lip-Prot-Amyl, (ZENPEP) 40000-126000 units CPEP Take 2 capsules with meals and one capsule with snacks. Patient taking differently: Take 2 capsules by mouth 2 (two) times daily. 06/08/21  Yes Meryl Dare, MD  pantoprazole (PROTONIX) 40 MG tablet Take 1 tablet (40 mg total) by mouth 2 (two) times daily. 01/31/22  Yes Meryl Dare, MD  Polysaccharide-Iron Complex 150 MG CAPS Take 1 tablet by mouth  daily.   Yes [provider]  venlafaxine XR (EFFEXOR-XR) 75 MG 24 hr capsule Take 1 capsule (75 mg total) by mouth daily. 05/18/20  Yes [provider]  vitamin B-12 (CYANOCOBALAMIN) 500 MCG tablet Take 500 mcg by mouth daily.   Yes [provider]  Vitamin D, Ergocalciferol, (DRISDOL) 1.25 MG (50000 UT) CAPS capsule Take 50,000 Units by mouth every Wednesday.    Yes [provider]    Inpatient Medications: Scheduled Meds:  atorvastatin  40 mg Oral Daily   Chlorhexidine Gluconate Cloth  6 each Topical Daily   feeding supplement  237 mL Oral TID WC   fiber supplement (BANATROL TF)  60 mL Oral TID   Gerhardt's butt cream   Topical QID   levETIRAcetam  500 mg Oral BID   lipase/protease/amylase  24,000 Units Oral TID AC   melatonin  3 mg Oral QHS   metoprolol tartrate  100 mg Oral BID   mirtazapine  15 mg Oral QHS   mouth rinse  15 mL Mouth Rinse 4 times per day   pantoprazole (PROTONIX) IV  40 mg Intravenous Q24H   Vivonex RTF  1,000 mL Per Tube Q24H   Continuous Infusions:  sodium chloride Stopped (06/13/23 0636)   PRN Meds: acetaminophen, docusate, hydrALAZINE, ipratropium-albuterol, loperamide, metoprolol tartrate, ondansetron (ZOFRAN) IV, mouth rinse, oxyCODONE-acetaminophen, polyethylene glycol, senna-docusate, traZODone  Allergies:    Allergies  Allergen Reactions   Adhesive [Tape] Other (See Comments)    "Took off my skin"   Other Rash    "Took off my skin"   Ace Inhibitors Other (See Comments)    Unknown   Clonidine     Resulted in excessive sleepiness/somnolence   Sulfa Antibiotics Other (See Comments)    Unknown   Topiramate     unknown   Codeine Other (See Comments)    Altered mental state    Iodinated Contrast Media Rash   Latex Rash    Social History:   Social History   Socioeconomic History   Marital status: Divorced    Spouse name: Not on file   Number of children: 2   Years of education: Not on file   Highest  education level: Not on file  Occupational History   Occupation: retired    Associate Professor: DISABLED  Tobacco Use   Smoking status: Former    Current packs/day: 0.00    Average packs/day: 1 pack/day for 50.0 years (50.0 ttl pk-yrs)    Types: Cigarettes    Start date: 10/14/1960    Quit date: 10/14/2010    Years since quitting: 12.7   Smokeless tobacco: Never  Vaping Use   Vaping status: Never Used  Substance and Sexual Activity   Alcohol use: No   Drug use: No   Sexual activity: Not Currently    Comment: Hysterectomy  Other Topics Concern   Not on file  Social History Narrative   Not on file   Social Determinants of Health   Financial Resource Strain: Not on file  Food Insecurity: No Food Insecurity (07/01/2023)   Hunger Vital Sign    Worried About Running Out of Food in the Last Year: Never true    Ran Out of Food in the Last Year: Never true  Transportation Needs: No Transportation Needs (07/01/2023)   PRAPARE - Administrator, Civil Service (Medical): No    Lack of Transportation (Non-Medical): No  Physical Activity: Not on file  Stress: Not on file  Social Connections: Not on file  Intimate Partner Violence: Not At Risk (07/01/2023)   Humiliation, Afraid, Rape, and Kick questionnaire    Fear of Current or Ex-Partner: No    Emotionally Abused: No    Physically Abused: No    Sexually Abused: No    Family History:    Family History  Problem Relation Age of Onset   Hypertension Mother    Osteoarthritis Mother    Pulmonary embolism Mother    Hypertension Father  aneurysm   Stroke Father    Colon cancer Paternal Grandfather    Stroke Other    Breast cancer Neg Hx    Colon polyps Neg Hx    Esophageal cancer Neg Hx    Rectal cancer Neg Hx    Stomach cancer Neg Hx    Pancreatic cancer Neg Hx    Liver disease Neg Hx      ROS:  Please see the history of present illness.   All other ROS reviewed and negative.     Physical Exam/Data:   Vitals:    07/01/23 0351 07/01/23 0400 07/01/23 0835 07/01/23 1221  BP: 132/79 134/73 (!) 117/96 136/76  Pulse: 76 74 76 (!) 59  Resp: 15 19 17 17   Temp: 97.9 F (36.6 C)  97.8 F (36.6 C) 97.7 F (36.5 C)  TempSrc: Oral  Oral Axillary  SpO2: 97% 98% 98% 95%  Weight:      Height:        Intake/Output Summary (Last 24 hours) at 07/01/2023 1244 Last data filed at 07/01/2023 0650 Gross per 24 hour  Intake --  Output 800 ml  Net -800 ml      06/30/2023    5:00 AM 06/28/2023    5:00 AM 06/26/2023    5:00 AM  Last 3 Weights  Weight (lbs) 181 lb 14.1 oz 182 lb 15.7 oz 181 lb  Weight (kg) 82.5 kg 83 kg 82.1 kg     Body mass index is 32.22 kg/m.  General:  Well nourished, well developed, in no acute distress HEENT: normal Neck: no JVD Vascular: No carotid bruits; Distal pulses 2+ bilaterally Cardiac:  normal S1, S2; RRR; no murmur  Lungs:  respirations unlabored,  Abd: soft, nontender, no hepatomegaly  Ext: no edema Musculoskeletal:  No deformities, BUE and BLE strength normal and equal Skin: warm and dry  Neuro:  CNs 2-12 intact, no focal abnormalities noted Psych:  Normal affect   EKG:  The EKG was personally reviewed and demonstrates:  repeat pending Telemetry:  Telemetry was personally reviewed and demonstrates:  sinus rhythm in the 60s with one episode of sinus pause of 2.84 sec with a non-conducted p wave  Relevant CV Studies:  Echo 06/13/23: 1. Left ventricular ejection fraction, by estimation, is 60 to 65%. The  left ventricle has normal function. The left ventricle has no regional  wall motion abnormalities. Left ventricular diastolic parameters are  consistent with Grade I diastolic  dysfunction (impaired relaxation).   2. Right ventricular systolic function is normal. The right ventricular  size is normal. There is normal pulmonary artery systolic pressure. The  estimated right ventricular systolic pressure is 13.5 mmHg.   3. The mitral valve is abnormal. Trivial mitral  valve regurgitation.   4. The aortic valve is tricuspid. Aortic valve regurgitation is not  visualized. Aortic valve sclerosis/calcification is present, without any  evidence of aortic stenosis. Aortic valve mean gradient measures 7.0 mmHg.   5. The inferior vena cava is normal in size with greater than 50%  respiratory variability, suggesting right atrial pressure of 3 mmHg.   Laboratory Data:  High Sensitivity Troponin:   Recent Labs  Lab 06/04/23 0625 06/04/23 0922  TROPONINIHS 19* 38*     Chemistry Recent Labs  Lab 06/27/23 0711 06/29/23 0937 07/01/23 0621  NA 135 133* 133*  K 3.8 4.5 4.2  CL 107 106 108  CO2 18* 17* 20*  GLUCOSE 144* 155* 125*  BUN 36* 32* 28*  CREATININE  1.49* 1.40* 1.70*  CALCIUM 8.7* 8.5* 8.3*  MG  --  1.6* 1.7  GFRNONAA 36* 39* 31*  ANIONGAP 10 10 5     No results for input(s): "PROT", "ALBUMIN", "AST", "ALT", "ALKPHOS", "BILITOT" in the last 168 hours. Lipids No results for input(s): "CHOL", "TRIG", "HDL", "LABVLDL", "LDLCALC", "CHOLHDL" in the last 168 hours.  Hematology Recent Labs  Lab 06/29/23 0937 07/01/23 0621 07/01/23 1059  WBC 10.4 16.6* 16.6*  RBC 2.89* 2.98* 2.69*  HGB 9.0* 8.8* 8.1*  HCT 27.7* 28.1* 25.4*  MCV 95.8 94.3 94.4  MCH 31.1 29.5 30.1  MCHC 32.5 31.3 31.9  RDW 15.9* 15.7* 15.7*  PLT 197 225 232   Thyroid No results for input(s): "TSH", "FREET4" in the last 168 hours.  BNPNo results for input(s): "BNP", "PROBNP" in the last 168 hours.  DDimer No results for input(s): "DDIMER" in the last 168 hours.   Radiology/Studies:  No results found.   Assessment and Plan:   CAD - recent PCI 02/12/2023 - IVUS guided orbital atherectomy and DES to ostial RCA (95% stenosis) - she has completed more than 4 months of DAPT with ASA and plavix - given recent stroke, she was transitioned to brilinta - admission EKG in the setting of respiratory failure with ST depressions inferior and lateral leads - repeat EKG does not  appear ischemic / unchanged from EKG following recent intervention - would continue to hold while recovering from GI bleed - recommend restart monotherapy with plavix   GI bleed - holding antiplatelet therapy - GI treating conservatively, no plans for repeat colonoscopy for now given her anatomy and prior failed    CVA this admission - in the setting of ASA and plavix - neurology recommended switching plavix to brilinta - now on hold   Hypertension - PTA: 60 mg imdur, 10 mg amlodipine, 0.1 mg clonidine BID, 100 mg toprol - currently on lopressor 100 mg BID   Hyperlipidemia with LDL goal < 70 06/23/2023: Cholesterol 72; HDL 32; LDL Cholesterol 27; Triglycerides 64; VLDL 13 Continue 40 mg lipitor    Risk Assessment/Risk Scores:        For questions or updates, please contact [facility] Please consult [email or web address] for contact info under    Signed, Marcelino Duster, PA  07/01/2023 12:44 PM  I have personally seen and examined this patient. I agree with the assessment and plan as outlined above.  77 yo female with history of CAD, carotid artery disease, CKD, DM, HTN, CVA and PAD. She has had a prolonged hospital course as outlined above.  Now with acute GI bleeding. She was on DAPT with ASA/Plavix post coronary stenting on 02/12/23. This was changed to Brilinta by the Neurology team recently post CVA.  My exam: Alert, NAD, CV:RRR Lungs: clear Ext: no edema Labs reviewed by me.  With current GI bleeding, agree with holding Brilinta. She is over 4 months out from her drug eluting stent.  Once her GI bleeding is stabilized, can consider resuming monotherapy with Plavix that should provide a lower bleeding risk than ASA or Brilinta.   Verne Carrow, MD, [facility] 07/01/2023 3:33 PM

## 2023-07-01 NOTE — Consult Note (Signed)
Consultation  Referring Provider: No ref. provider found Primary Care Physician:  Garlan Fillers, MD Primary Gastroenterologist:  Dr.Stark  Reason for Consultation:  rectal bleeding  HPI: [name] is a 77 y.o. female was admitted 28 days ago after she presented with shortness of breath and was found to be hypoxic, and required intubation for respiratory failure She was then diagnosed with a new CVA on 06/08/2023.  She had required tube feedings CVA, these were discontinued yesterday and she is now a dysphagia diet.  She has other significant comorbidities including seizure disorder, hypertension, coronary artery disease status post PCI in May 2024 for which she has been on Brilinta, chronic kidney disease stage IV. She has been on aspirin, Brilinta and subcu heparin Note relates that she has required 4 units of packed RBCs throughout her hospital stay for anemia without any evidence of overt bleeding She was noted to have a grossly bloody bowel movement during the night. She has had a second episode this morning found during my exam to be sitting dark liquid stool.  Not certain if she has been continent since her CVA that she says currently stool "just comes out".  She does have history of chronic diarrhea felt secondary to previous extended right in 2019 done for ischemic colitis. She says she has had some intermittent nausea over the past few days and a stomachache" but no significant abdominal pain or cramping.  No vomiting No abdominal imaging done this admission  Hemoglobin on 06/27/2023 9.5, down to 9.0 06/29/2023, and hemoglobin 8.8 today  Last flexible sigmoidoscopy March 2020 with findings felt consistent with mild diversion colitis, inability to get scope beyond 25 cm due to restricted colonic mobility and noted internal hemorrhoids. She does have prior history of adenomatous colon polyps EGD April 2021 with finding of a medium sized hiatal hernia and retained food  otherwise negative exam   CT abd/Pelvis done earlier this year March 2024 did not ecchymosis of the colon  Stable this morning therapy 117/76/pulse 76   Past Medical History:  Diagnosis Date   Anxiety    Arthritis    back    Blind loop syndrome    Clotting disorder (HCC)    Hx Clot in leg per pt    Colostomy present [facility])    Coronary artery disease    a. known 60-70% mid-LAD stenosis with caths in 1999, 2002, 2006, and 2012 showing stable anatomy b. 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HCC)    Diabetes mellitus    Diverticulosis    Dizziness    chronic   Dyslipidemia    Fatty liver    GERD (gastroesophageal reflux disease)    hiatial hernia   Heart murmur    Hiatal hernia    Hypertension    Irritable bowel syndrome    Pancreatitis, chronic (HCC)    Peripheral vascular disease (HCC) 02/20/2010   s/p stent of left SFA; carotid stenosis   Seizure (HCC) 11/04/2018   last 1 yr 2020 per pt unsure month    Sleep apnea    no cpap    Stroke [facility])    Thyroid nodule    Vitamin B12 deficiency     Past Surgical History:  Procedure Laterality Date   ABDOMINAL HYSTERECTOMY     partial   AUGMENTATION MAMMAPLASTY     bilateral 1976 retro    BREAST ENHANCEMENT SURGERY     CARDIAC CATHETERIZATION  07/07/98;08/31/01;03/04/05  60 -70% LAD   CATARACT EXTRACTION Bilateral    COLON RESECTION N/A 10/14/2017   Procedure: EXPLORATORY LAPAROTOMY, EXTENDED RIGHT COLECTOMY; APPLICATION OF ABDOMINAL VACUUM DRESSING;  Surgeon: Griselda Miner, MD;  Location: [facility] ORS;  Service: General;  Laterality: N/A;   COLONOSCOPY     CORONARY ATHERECTOMY N/A 02/12/2023   Procedure: CORONARY ATHERECTOMY;  Surgeon: Swaziland, Peter M, MD;  Location: [facility] INVASIVE CV LAB;  Service: Cardiovascular;  Laterality: N/A;   CORONARY STENT INTERVENTION N/A 02/12/2023   Procedure: CORONARY STENT INTERVENTION;  Surgeon: Swaziland, Peter M, MD;  Location: [facility] INVASIVE CV LAB;  Service: Cardiovascular;   Laterality: N/A;   CORONARY ULTRASOUND/IVUS N/A 02/12/2023   Procedure: Coronary Ultrasound/IVUS;  Surgeon: Swaziland, Peter M, MD;  Location: [facility] INVASIVE CV LAB;  Service: Cardiovascular;  Laterality: N/A;   ENDOBRONCHIAL ULTRASOUND Bilateral 02/24/2017   Procedure: ENDOBRONCHIAL ULTRASOUND;  Surgeon: Leslye Peer, MD;  Location: [facility] ENDOSCOPY;  Service: Cardiopulmonary;  Laterality: Bilateral;   FEMORAL ARTERY STENT     Left leg   LAPAROTOMY N/A 10/16/2017   Procedure: EXPLORATORY LAPAROTOMY, PARTIAL OMENTECTOMY, RESECTION ISCHEMIC ILEUM 130CM, APPLICATION OF VAC ABDOMINAL DRESSING;  Surgeon: Darnell Level, MD;  Location: [facility] ORS;  Service: General;  Laterality: N/A;   LAPAROTOMY N/A 10/18/2017   Procedure: RE-EXPLORATION OF ABDOMEN, ILEOSTOMY CREATION;  Surgeon: Almond Lint, MD;  Location: [facility] ORS;  Service: General;  Laterality: N/A;   LEFT HEART CATH AND CORONARY ANGIOGRAPHY N/A 01/29/2023   Procedure: LEFT HEART CATH AND CORONARY ANGIOGRAPHY;  Surgeon: Swaziland, Peter M, MD;  Location: [facility] INVASIVE CV LAB;  Service: Cardiovascular;  Laterality: N/A;   LUNG SURGERY  03/2017   Benign polyps removed   PATELLA REALIGNMENT Left    POLYPECTOMY     SIGMOIDOSCOPY     TONSILLECTOMY     TRANSCAROTID ARTERY REVASCULARIZATION  Right 04/18/2022   Procedure: Transcarotid Artery Revascularization;  Surgeon: Victorino Sparrow, MD;  Location: [facility] OR;  Service: Vascular;  Laterality: Right;    Prior to Admission medications   Medication Sig Start Date End Date Taking? Authorizing Provider  albuterol (VENTOLIN HFA) 108 (90 Base) MCG/ACT inhaler Inhale 1-2 puffs into the lungs every 6 (six) hours as needed for wheezing or shortness of breath. 03/30/21  Yes [provider]  amLODipine (NORVASC) 10 MG tablet Take 1 tablet (10 mg total) by mouth daily. 04/20/23  Yes Leroy Sea, MD  aspirin 81 MG chewable tablet Chew 1 tablet (81 mg total) by mouth daily. 04/19/22  Yes Tyrone Nine, MD  atorvastatin  (LIPITOR) 40 MG tablet Take 1 tablet (40 mg total) by mouth daily. 04/19/22  Yes Tyrone Nine, MD  cloNIDine (CATAPRES) 0.1 MG tablet Take 1 tablet (0.1 mg total) by mouth 2 (two) times daily. 04/20/23  Yes Leroy Sea, MD  clopidogrel (PLAVIX) 75 MG tablet Take 1 tablet (75 mg total) by mouth daily. Patient taking differently: Take 75 mg by mouth every evening. 04/19/22  Yes Tyrone Nine, MD  esomeprazole (NEXIUM) 40 MG capsule Take 40 mg by mouth 2 (two) times daily before a meal. 01/11/20  Yes [provider]  fluticasone (FLONASE) 50 MCG/ACT nasal spray Place 2 sprays into both nostrils daily as needed for allergies. 01/29/16  Yes [provider]  furosemide (LASIX) 20 MG tablet Take 1 tablet (20 mg total) by mouth daily as needed. Patient taking differently: Take 20 mg by mouth every other day. 02/13/23  Yes Perlie Gold, PA-C  HYDROcodone-acetaminophen [facility])  10-325 MG tablet Take 1 tablet by mouth every 6 (six) hours as needed for moderate pain or severe pain.   Yes [provider]  isosorbide mononitrate (IMDUR) 60 MG 24 hr tablet TAKE 1 TABLET BY MOUTH EVERY DAY 04/28/23  Yes Swaziland, Peter M, MD  levETIRAcetam (KEPPRA) 500 MG tablet TAKE 1 TABLET BY MOUTH TWICE A DAY Patient taking differently: Take 500 mg by mouth 2 (two) times daily. 01/20/20  Yes Dohmeier, Porfirio Mylar, MD  meclizine (ANTIVERT) 25 MG tablet Take 0.5 tablets (12.5 mg total) by mouth 3 (three) times daily as needed for dizziness. 01/04/23  Yes Leroy Sea, MD  metoprolol succinate (TOPROL-XL) 100 MG 24 hr tablet Take 100 mg by mouth daily. 12/20/21  Yes [provider]  mirtazapine (REMERON) 15 MG tablet Take 15 mg by mouth at bedtime. 07/18/21  Yes [provider]  montelukast (SINGULAIR) 10 MG tablet Take 10 mg by mouth daily. 08/09/18  Yes [provider]  nitroGLYCERIN (NITROSTAT) 0.4 MG SL tablet Place 1 tablet (0.4 mg total) under the tongue every 5 (five) minutes as  needed for chest pain. 01/23/23  Yes [place]azil[place], Peter M, MD  ondansetron (ZOFRAN) 4 MG tablet Take 1 tablet (4 mg total) by mouth every 6 (six) hours as needed for nausea or vomiting. 10/24/21  Yes Hurley Sobel S, PA-C  Pancrelipase, Lip-Prot-Amyl, (ZENPEP) 40000-126000 units CPEP Take 2 capsules with meals and one capsule with snacks. Patient taking differently: Take 2 capsules by mouth 2 (two) times daily. 06/08/21  Yes Meryl Dare, MD  pantoprazole (PROTONIX) 40 MG tablet Take 1 tablet (40 mg total) by mouth 2 (two) times daily. 01/31/22  Yes Meryl Dare, MD  Polysaccharide-Iron Complex 150 MG CAPS Take 1 tablet by mouth daily.   Yes [provider]  venlafaxine XR (EFFEXOR-XR) 75 MG 24 hr capsule Take 1 capsule (75 mg total) by mouth daily. 05/18/20  Yes [provider]  vitamin B-12 (CYANOCOBALAMIN) 500 MCG tablet Take 500 mcg by mouth daily.   Yes [provider]  Vitamin D, Ergocalciferol, (DRISDOL) 1.25 MG (50000 UT) CAPS capsule Take 50,000 Units by mouth every Wednesday.    Yes [provider]    Current Facility-Administered Medications  Medication Dose Route Frequency Provider Last Rate Last Admin   0.9 %  sodium chloride infusion  250 mL Intravenous Continuous Steffanie Dunn, DO   Stopped at 06/13/23 0636   acetaminophen (TYLENOL) tablet 650 mg  650 mg Oral Q6H PRN Gaetana Michaelis, MD   650 mg at 06/29/23 2034   atorvastatin (LIPITOR) tablet 40 mg  40 mg Oral Daily Amin, Ankit C, MD   40 mg at 07/01/23 2841   Chlorhexidine Gluconate Cloth 2 % PADS 6 each  6 each Topical Daily Steffanie Dunn, DO   6 each at 07/01/23 3244   docusate (COLACE) 50 MG/5ML liquid 100 mg  100 mg Oral BID PRN Amin, Ankit C, MD       feeding supplement (ENSURE ENLIVE / ENSURE PLUS) liquid 237 mL  237 mL Oral TID WC Maretta Bees, MD   237 mL at 06/28/23 0951   fiber supplement (BANATROL TF) liquid 60 mL  60 mL Oral TID Amin, Ankit C, MD   60 mL at 06/29/23 2035    Gerhardt's butt cream   Topical QID Oretha Milch, MD   Given at 07/01/23 [phone]   hydrALAZINE (APRESOLINE) injection 5 mg  5 mg Intravenous  Q4H PRN Elgergawy, Leana Roe, MD       ipratropium-albuterol (DUONEB) 0.5-2.5 (3) MG/3ML nebulizer solution 3 mL  3 mL Nebulization Q4H PRN Selmer Dominion B, NP       levETIRAcetam (KEPPRA) tablet 500 mg  500 mg Oral BID Amin, Ankit C, MD   500 mg at 07/01/23 8119   lipase/protease/amylase (CREON) capsule 24,000 Units  24,000 Units Oral TID AC Amin, Ankit C, MD   24,000 Units at 07/01/23 1478   loperamide (IMODIUM) capsule 4 mg  4 mg Oral PRN Amin, Ankit C, MD   4 mg at 07/01/23 0552   melatonin tablet 3 mg  3 mg Oral QHS Amin, Ankit C, MD   3 mg at 06/29/23 2034   metoprolol tartrate (LOPRESSOR) injection 5 mg  5 mg Intravenous Q4H PRN Amin, Ankit C, MD       metoprolol tartrate (LOPRESSOR) tablet 100 mg  100 mg Oral BID Amin, Ankit C, MD   100 mg at 07/01/23 2956   mirtazapine (REMERON SOL-TAB) disintegrating tablet 15 mg  15 mg Oral QHS Elgergawy, Leana Roe, MD   15 mg at 06/29/23 2140   ondansetron (ZOFRAN) injection 4 mg  4 mg Intravenous Q6H PRN Karie Fetch P, DO   4 mg at 06/30/23 1249   Oral care mouth rinse  15 mL Mouth Rinse 4 times per day Karie Fetch P, DO   15 mL at 07/01/23 2130   Oral care mouth rinse  15 mL Mouth Rinse PRN Elgergawy, Leana Roe, MD       oxyCODONE-acetaminophen (PERCOCET/ROXICET) 5-325 MG per tablet 1 tablet  1 tablet Oral Q8H PRN Maretta Bees, MD   1 tablet at 06/28/23 2015   pantoprazole (PROTONIX) injection 40 mg  40 mg Intravenous Q24H Daphney Hopke S, PA-C       polyethylene glycol (MIRALAX / GLYCOLAX) packet 17 g  17 g Oral Daily PRN Amin, Ankit C, MD       senna-docusate (Senokot-S) tablet 1 tablet  1 tablet Oral QHS PRN Amin, Ankit C, MD       traZODone (DESYREL) tablet 150 mg  150 mg Oral QHS PRN Paliwal, Aditya, MD   150 mg at 06/29/23 2034   Vivonex RTF LIQD 1,000 mL  1,000 mL Per Tube Q24H Maretta Bees, MD    1,000 mL at 06/28/23 1755    Allergies as of 06/04/2023 - Review Complete 06/04/2023  Allergen Reaction Noted   Adhesive [tape] Other (See Comments) 04/09/2018   Other Rash 04/09/2018   Ace inhibitors Other (See Comments) 02/21/2021   Sulfa antibiotics Other (See Comments) 02/21/2021   Topiramate  02/21/2021   Codeine Other (See Comments) 11/08/2010   Iodinated contrast media Rash 11/08/2010   Latex Rash 01/02/2023    Family History  Problem Relation Age of Onset   Hypertension Mother    Osteoarthritis Mother    Pulmonary embolism Mother    Hypertension Father        aneurysm   Stroke Father    Colon cancer Paternal Grandfather    Stroke Other    Breast cancer Neg Hx    Colon polyps Neg Hx    Esophageal cancer Neg Hx    Rectal cancer Neg Hx    Stomach cancer Neg Hx    Pancreatic cancer Neg Hx    Liver disease Neg Hx     Social History   Socioeconomic History   Marital status: Divorced    Spouse name:  Not on file   Number of children: 2   Years of education: Not on file   Highest education level: Not on file  Occupational History   Occupation: retired    Associate Professor: DISABLED  Tobacco Use   Smoking status: Former    Current packs/day: 0.00    Average packs/day: 1 pack/day for 50.0 years (50.0 ttl pk-yrs)    Types: Cigarettes    Start date: 10/14/1960    Quit date: 10/14/2010    Years since quitting: 12.7   Smokeless tobacco: Never  Vaping Use   Vaping status: Never Used  Substance and Sexual Activity   Alcohol use: No   Drug use: No   Sexual activity: Not Currently    Comment: Hysterectomy  Other Topics Concern   Not on file  Social History Narrative   Not on file   Social Determinants of Health   Financial Resource Strain: Not on file  Food Insecurity: No Food Insecurity (02/12/2023)   Hunger Vital Sign    Worried About Running Out of Food in the Last Year: Never true    Ran Out of Food in the Last Year: Never true  Transportation Needs: No  Transportation Needs (02/12/2023)   PRAPARE - Administrator, Civil Service (Medical): No    Lack of Transportation (Non-Medical): No  Physical Activity: Not on file  Stress: Not on file  Social Connections: Not on file  Intimate Partner Violence: Not At Risk (02/12/2023)   Humiliation, Afraid, Rape, and Kick questionnaire    Fear of Current or Ex-Partner: No    Emotionally Abused: No    Physically Abused: No    Sexually Abused: No    Review of Systems: Pertinent positive and negative review of systems were noted in the above HPI section.  All other review of systems was otherwise negative.   Physical Exam: Vital signs in last 24 hours: Temp:  [97.6 F (36.4 C)-97.9 F (36.6 C)] 97.8 F (36.6 C) (09/17 0835) Pulse Rate:  [71-76] 76 (09/17 0835) Resp:  [12-19] 17 (09/17 0835) BP: (100-134)/(54-96) 117/96 (09/17 0835) SpO2:  [93 %-98 %] 98 % (09/17 0835) Last BM Date : 06/30/23 General:   Alert,  Well-developed, well-nourished,elderly WF pleasant and cooperative in NAD Head:  Normocephalic and atraumatic. Eyes:  Sclera clear, no icterus.   Conjunctiva pale Ears:  Normal auditory acuity. Nose:  No deformity, discharge,  or lesions. Mouth:  No deformity or lesions.   Neck:  Supple; no masses or thyromegaly. Lungs:  Clear throughout to auscultation.   No wheezes, crackles, or rhonchi . Heart:  irRegular rate and rhythm; no murmurs, clicks, rubs,  or gallops. Abdomen:  Soft,obese multiple areas of ecchymosis across the lowe abdomen, and mid abdomen, area of firm tenderness LLQ - probable hematoma BS active,nonpalp mass or hsm.   Rectal:  pt sitting in a large liquid stool dark red/brown, no clots Msk:  Symmetrical without gross deformities. . Pulses:  Normal pulses noted. Extremities:  Without clubbing or edema. Neurologic:  Alert and  oriented x3;  grossly normal neurologically. Skin:  Intact without significant lesions or rashes.. Psych:  Alert and cooperative. Normal  mood and affect.  Intake/Output from previous day: 09/16 0701 - 09/17 0700 In: -  Out: 800 [Urine:800] Intake/Output this shift: No intake/output data recorded.  Lab Results: Recent Labs    06/29/23 0937 07/01/23 0621  WBC 10.4 16.6*  HGB 9.0* 8.8*  HCT 27.7* 28.1*  PLT 197 225  BMET Recent Labs    06/29/23 0937 07/01/23 0621  NA 133* 133*  K 4.5 4.2  CL 106 108  CO2 17* 20*  GLUCOSE 155* 125*  BUN 32* 28*  CREATININE 1.40* 1.70*  CALCIUM 8.5* 8.3*   LFT No results for input(s): "PROT", "ALBUMIN", "AST", "ALT", "ALKPHOS", "BILITOT", "BILIDIR", "IBILI" in the last 72 hours. PT/INR No results for input(s): "LABPROT", "INR" in the last 72 hours. Hepatitis Panel No results for input(s): "HEPBSAG", "HCVAB", "HEPAIGM", "HEPBIGM" in the last 72 hours.     IMPRESSION:  #24 77 year old white female admitted with hypoxic respiratory failure 28 days ago secondary to pneumonia, required intubation. #2 CVA 5 days after admission #3 dysphagia post CVA-improved, had required tube feedings, now on dysphagia diet #4 coronary artery disease status post PCI May 2024 on Brilinta/aspirin #5 Chronic kidney disease stage IV #6.  Extended right hemicolectomy 2019 secondary to ischemic colitis  #7 acute onset rectal bleeding last night, in setting of aspirin, Brilinta, and subcu heparin Second episode this morning  GI bleeding is not clear likely lower, she does not have any significant BUN elevation.  But in setting of anticoagulation and antiplatelet therapy cannot completely rule out upper GI bleed. Right hemicolectomy, does not have known diverticulosis in the remaining colon   #8 extensive abdominal ecchymoses in 1 area concerning for hematoma in the left lower abdominal wall   Plan; keep n.p.o. today Serial hemoglobins every 6 hours, transfuse for hemoglobin 7.5 or less given multiple comorbidities Start IV Protonix ASA, Brilinta and SQ HEp being held this am- Brilinta  has a 5 day washout time Will discuss, not a good candidate for endoscopic evaluation today in setting of Brilinta, and prior attempt at flexible sigmoidoscopy only able to get to 25 cm due to fixed bowel She will likely need CTA-certainly if she has any further bleeding in the next couple of hours.  Will observe carefully GI will follow with you     Gus Littler PA-C 07/01/2023, 10:01 AM

## 2023-07-01 NOTE — Plan of Care (Signed)

## 2023-07-01 NOTE — TOC Progression Note (Addendum)
Transition of Care [facility]) - Progression Note    Patient Details  Name: LATORI MURTY MRN: [id number] Date of Birth: 05/09/1946  [facility]) CM/SW Contact  Mearl Latin, LCSW Phone Number: 07/01/2023, 12:56 PM  Clinical Narrative:    12:57 PM-CSW met with patient and provided SNF bed offers and Medicare ratings list. CSW obtained verbal permission to contact patient's son, Loraine Leriche. CSW spoke with Loraine Leriche and he requested [facility]. CSW made him aware that CSW will reach out as they have not responded to the referral yet. He stated he will get in touch with his family contact for assistance.  1:07 PM-[facility] has accepted patient. Will begin insurance process once patient is medically stable. Son updated.    Expected Discharge Plan: Skilled Nursing Facility Barriers to Discharge: Continued Medical Work up, English as a second language teacher, SNF Pending bed offer  Expected Discharge Plan and Services In-house Referral: Clinical Social Work   Post Acute Care Choice: Skilled Nursing Facility Living arrangements for the past 2 months: Single Family Home                                       Social Determinants of Health (SDOH) Interventions SDOH Screenings   Food Insecurity: No Food Insecurity (07/01/2023)  Housing: Patient Unable To Answer (07/01/2023)  Transportation Needs: No Transportation Needs (07/01/2023)  Utilities: Not At Risk (07/01/2023)  Tobacco Use: Medium Risk (06/04/2023)    Readmission Risk Interventions    07/01/2023   12:56 PM 06/27/2023    8:42 AM 06/04/2023    3:49 PM  Readmission Risk Prevention Plan  Transportation Screening Complete Complete Complete  Medication Review (RN Care Manager)   Referral to Pharmacy  PCP or Specialist appointment within 3-5 days of discharge   Complete  HRI or Home Care Consult   Complete  SW Recovery Care/Counseling Consult   Complete  Palliative Care Screening   Not Applicable  Skilled Nursing Facility   Not Applicable

## 2023-07-02 ENCOUNTER — Inpatient Hospital Stay (HOSPITAL_COMMUNITY): Payer: Medicare HMO

## 2023-07-02 DIAGNOSIS — Z8673 Personal history of transient ischemic attack (TIA), and cerebral infarction without residual deficits: Secondary | ICD-10-CM | POA: Diagnosis not present

## 2023-07-02 DIAGNOSIS — I2510 Atherosclerotic heart disease of native coronary artery without angina pectoris: Secondary | ICD-10-CM | POA: Diagnosis not present

## 2023-07-02 DIAGNOSIS — D649 Anemia, unspecified: Secondary | ICD-10-CM | POA: Diagnosis not present

## 2023-07-02 DIAGNOSIS — Z955 Presence of coronary angioplasty implant and graft: Secondary | ICD-10-CM | POA: Diagnosis not present

## 2023-07-02 DIAGNOSIS — J9601 Acute respiratory failure with hypoxia: Secondary | ICD-10-CM | POA: Diagnosis not present

## 2023-07-02 DIAGNOSIS — K922 Gastrointestinal hemorrhage, unspecified: Secondary | ICD-10-CM | POA: Diagnosis not present

## 2023-07-02 LAB — GLUCOSE, CAPILLARY
Glucose-Capillary: 101 mg/dL — ABNORMAL HIGH (ref 70–99)
Glucose-Capillary: 106 mg/dL — ABNORMAL HIGH (ref 70–99)
Glucose-Capillary: 117 mg/dL — ABNORMAL HIGH (ref 70–99)
Glucose-Capillary: 117 mg/dL — ABNORMAL HIGH (ref 70–99)
Glucose-Capillary: 89 mg/dL (ref 70–99)
Glucose-Capillary: 97 mg/dL (ref 70–99)

## 2023-07-02 NOTE — Progress Notes (Signed)
OT Cancellation Note  Patient Details Name: [name] MRN: [id number] DOB: 10-05-1946   Cancelled Treatment:    Reason Eval/Treat Not Completed: Patient declined, no reason specified (Pt with refusal. Asking OT to return tomorrow. Will return as schedule allows.)  Tyler Deis, OTR/L [facility] Acute Rehabilitation Office: [phone]   [name] 07/02/2023, 3:56 PM

## 2023-07-02 NOTE — TOC Progression Note (Signed)
Transition of Care [facility]) - Progression Note    Patient Details  Name: GRADY VILLADA MRN: [id number] Date of Birth: 01-29-1946  Transition of Care [facility]) CM/SW Contact  Mearl Latin, LCSW Phone Number: 07/02/2023, 4:50 PM  Clinical Narrative:    CSW submitted and received insurance approval for [facility], Ref# [id number], effective 07/03/2023-07/07/2023.   Expected Discharge Plan: Skilled Nursing Facility Barriers to Discharge: Continued Medical Work up  Expected Discharge Plan and Services In-house Referral: Clinical Social Work   Post Acute Care Choice: Skilled Nursing Facility Living arrangements for the past 2 months: Single Family Home                                       Social Determinants of Health (SDOH) Interventions SDOH Screenings   Food Insecurity: No Food Insecurity (07/01/2023)  Housing: Patient Unable To Answer (07/01/2023)  Transportation Needs: No Transportation Needs (07/01/2023)  Utilities: Not At Risk (07/01/2023)  Tobacco Use: Medium Risk (06/04/2023)    Readmission Risk Interventions    07/01/2023   12:56 PM 06/27/2023    8:42 AM 06/04/2023    3:49 PM  Readmission Risk Prevention Plan  Transportation Screening Complete Complete Complete  Medication Review (RN Care Manager)   Referral to Pharmacy  PCP or Specialist appointment within 3-5 days of discharge   Complete  HRI or Home Care Consult   Complete  SW Recovery Care/Counseling Consult   Complete  Palliative Care Screening   Not Applicable  Skilled Nursing Facility   Not Applicable

## 2023-07-02 NOTE — Plan of Care (Signed)
  Problem: Education: Goal: Knowledge of General Education information will improve Description: Including pain rating scale, medication(s)/side effects and non-pharmacologic comfort measures Outcome: Progressing   Problem: Health Behavior/Discharge Planning: Goal: Ability to manage health-related needs will improve Outcome: Progressing   Problem: Clinical Measurements: Goal: Ability to maintain clinical measurements within normal limits will improve Outcome: Progressing Goal: Will remain free from infection Outcome: Progressing Goal: Diagnostic test results will improve Outcome: Progressing Goal: Respiratory complications will improve Outcome: Progressing Goal: Cardiovascular complication will be avoided Outcome: Progressing   Problem: Activity: Goal: Risk for activity intolerance will decrease Outcome: Progressing   Problem: Nutrition: Goal: Adequate nutrition will be maintained Outcome: Progressing   Problem: Coping: Goal: Level of anxiety will decrease Outcome: Progressing   Problem: Elimination: Goal: Will not experience complications related to bowel motility Outcome: Progressing Goal: Will not experience complications related to urinary retention Outcome: Progressing   Problem: Pain Managment: Goal: General experience of comfort will improve Outcome: Progressing   Problem: Safety: Goal: Ability to remain free from injury will improve Outcome: Progressing   Problem: Skin Integrity: Goal: Risk for impaired skin integrity will decrease Outcome: Progressing   Problem: Education: Goal: Ability to describe self-care measures that may prevent or decrease complications (Diabetes Survival Skills Education) will improve Outcome: Progressing Goal: Individualized Educational Video(s) Outcome: Progressing   Problem: Coping: Goal: Ability to adjust to condition or change in health will improve Outcome: Progressing   Problem: Fluid Volume: Goal: Ability to  maintain a balanced intake and output will improve Outcome: Progressing   Problem: Health Behavior/Discharge Planning: Goal: Ability to identify and utilize available resources and services will improve Outcome: Progressing Goal: Ability to manage health-related needs will improve Outcome: Progressing

## 2023-07-02 NOTE — Progress Notes (Addendum)
Modified Barium Swallow Study  Patient Details  Name: [name] MRN: [id number] Date of Birth: [birth year]  Today's Date: 07/02/2023  Modified Barium Swallow completed.  Full report located under Chart Review in the Imaging Section.  History of Present Illness Pt is a 77 yo female presenting 8/21 with shortness of breath, cough, hypoxia. ETT 8/21-8/28. Pt was seen by SLP most recently in July 2023, recommending regular solids and thin liquids but noting lingual abnormalities and reduced ROM secondary to lingual trauma). Esophagram (November 2021) showed penetration of thin liquids, sensed aspiration with thicker barium, mild esophageal dysmotility, and HH with mild narrowing of the GE junction.   FEES at outside hospital in 2019 [facility]. Imaging from [facility] in 2019 reviewed (report not available) with transient penetration and vallecular residue noted. PMH includes: GERD, stroke, recurrent PNA, seizures, sleep apnea, DM, HTN, DVT, B12 deficiency, CAD, colostomy, chronic pancreatitis   Clinical Impression Pt continues to present with presumed acute on chronic oropharyngeal dysphagia, although with seemingly notably improved oral phase. Pt with only trace oral residue, increased control during bolus hold, and more organized lingual movement. Pharyngeally, pt continues to present with only partial anterior hyoid movement and laryngeal elevation, although presents with complete epiglottic inversion this date. With thin liquids, pt has gross penetration and eventual trace aspiration, although with apparently improved sensation, as she initiated an ineffective cough (PAS 7). In subsequent trials of thin liquids, pt penetrated to the vocal folds but was able to clear material with a cued cough (PAS 5). The pill was given whole with purees based on pt's reports of doing this at baseline. The pill was noted to be slow moving during the esophageal sweep, requiring a subsequent bite of purees to clear. Subjectively,  pt presents with improvements in oral phase, although suspect acute pharyngeal deficits related to intubation and deconditioning. Despite attempts at reconditioning, do not feel that a diet upgrade is appropriate at this time given pt's history of frequent PNA and performance this date. Recommend continuing current diet of Dys 3 textures with nectar thick liquids and ongoing SLP f/u.  Factors that may increase risk of adverse event in presence of aspiration [name] & [name] 2021): Respiratory or GI disease;Reduced cognitive function;Limited mobility;Frail or deconditioned;Dependence for feeding and/or oral hygiene  Swallow Evaluation Recommendations Recommendations: PO diet PO Diet Recommendation: Dysphagia 3 (Mechanical soft);Mildly thick liquids (Level 2, nectar thick) Liquid Administration via: Cup;Straw Medication Administration: Whole meds with puree Supervision: Staff to assist with self-feeding Swallowing strategies  : Slow rate;Small bites/sips Postural changes: Position pt fully upright for meals;Stay upright 30-60 min after meals Oral care recommendations: Oral care BID (2x/day) Caregiver Recommendations: Avoid jello, ice cream, thin soups, popsicles;Remove water pitcher      [name], M.A., CF-SLP Speech Language Pathology, Acute Rehabilitation Services  Secure Chat preferred [phone]  07/02/2023,3:22 PM

## 2023-07-02 NOTE — Progress Notes (Signed)
Cardiology Note:   Pt was seen yesterday. Full consult note in chart.  Chart review today No new recommendations from a cardiac standpoint. We will follow along this week at a distance and can provide further recommendations as needed. Please call with questions.   Verne Carrow, MD 07/02/2023 9:39 AM

## 2023-07-02 NOTE — Progress Notes (Signed)
PROGRESS NOTE        PATIENT DETAILS Name: [name] Age: 77 y.o. Sex: female Date of Birth: [birth year] Admit Date: 06/04/2023 Admitting Physician Steffanie Dunn, DO ONG:[id number], Barry Dienes, MD  Brief Summary: Patient is a 77 y.o.  female with history of CVA, seizure, HTN, CAD s/p PCI May 2024, CKD 4, seizure disorder-presented to the ED on 8/21 with shortness of breath- found to have severe hypoxia-initially required BiPAP but subsequently intubated-admitted to the ICU-stabilized-extubated on 8/28-subsequently developed severe encephalopathy, found to have CVA, oropharyngeal dysphagia requiring NG tube feeds.  See below for further details.  Significant events: 8/21>> admitted, intubated 8/28>>extubated 9/02>> transferred to [facility] 9/07>> MRI significant for subacute CVA 9/16>>Cortrak tube removed 9/17>> hematochezia-GI/cardiology consult-DAPT stopped.  Significant studies: 8/21>> CXR: Multilobar right-sided PNA 8/21>> CT chest: Large airspace opacity involving right upper/lower lobes 8/30>> Echo: EF 60-65% 9/07>> MRI brain: Subacute infarct left parietal lobe 9/08>> MRI brain: Chronic occlusion left ICA 9/09>> carotid Doppler: Known chronic left ICA occlusion,<50% right carotid. 9/09>> LDL: 27 9/09>> A1c: 5.9  Significant microbiology data: 8/21>> COVID/influenza/RSV PCR: Negative 8/21>> respiratory virus panel: Negative 8/21>> tracheal aspirate: Negative 8/21>> blood culture: Negative 9/03>> C. difficile studies: Negative  Procedures: 8/23>>Cortrak  Consults: PCCM Neuro Cardiology GI  Subjective: No hematochezia overnight.  Objective: Vitals: Blood pressure (!) 175/70, pulse 70, temperature 97.9 F (36.6 C), temperature source Oral, resp. rate 16, height 5\' 3"  (1.6 m), weight 80.7 kg, SpO2 95%.   Exam: Gen Exam:Alert awake-not in any distress HEENT:atraumatic, normocephalic Chest: B/L clear to auscultation anteriorly CVS:S1S2  regular Abdomen:soft non tender, non distended Extremities:no edema Neurology: Non focal Skin: no rash  Pertinent Labs/Radiology:    Latest Ref Rng & Units 07/02/2023    3:38 AM 07/01/2023   10:07 PM 07/01/2023    4:06 PM  CBC  WBC 4.0 - 10.5 K/uL 13.2     Hemoglobin 12.0 - 15.0 g/dL 8.2  7.6  8.8   Hematocrit 36.0 - 46.0 % 26.0  23.7  27.0   Platelets 150 - 400 K/uL 235       Lab Results  Component Value Date   NA 134 (L) 07/02/2023   K 4.4 07/02/2023   CL 105 07/02/2023   CO2 21 (L) 07/02/2023      Assessment/Plan: Severe sepsis secondary to pneumonia (POA) Acute hypoxic respiratory failure Overall improved Completed a course of antibiotics Supportive care  Acute metabolic encephalopathy Slowly improving Supportive care Delirium precautions  AKI on CKD 4 AKI likely hemodynamically mediated Creatinine slowly improving and close to baseline Follow electrolytes periodically  Normocytic anemia Due to critical illness/CKD/significant ecchymosis/bruising No evidence of GI bleeding S/p 4 units of PRBC so far-last on 9/10 Follow CBC closely.  Lower GI bleeding Likely diverticular bleeding Occurred on 9/17 No further hematochezia overnight Hb stable Hopefully self-limited-and will not recur again Appreciate GI input  CAD s/p PCI May 2024 Due to hematochezia-DAPT stopped 9/17  Appreciate cardiology input-once GI bleeding resolves-can do monotherapy with just aspirin Continue statin/beta-blocker.    Subacute CVA Workup as above Neuro recommending aspirin/Brilinta (as patient had CVA while on aspirin/Plavix)-however both agents now on hold given hematochezia.  HTN BP stable Continue metoprolol Amlodipine/Imdur/hydralazine on hold-slowly resume when able  Seizure disorder Keppra  GERD PPI  Mood disorder/depression Overall stable On Remeron  Oropharyngeal dysphagia 2/2 acute illness/debility/deconditioning Required  NG-tube feedings-NG tube was  discontinued on 9/16-as oral intake significantly improved Continue dysphagia 3 diet-SLP following  Per prior note-IR PEG tube not possible due to ventral hernia-due to cardiac issues-aspirin/Brilinta-difficult for surgical PEG.    11 mm right thyroid nodule Per radiology not clinically significant-no follow-up imaging recommended  Nutrition Status: Nutrition Problem: Increased nutrient needs Etiology: acute illness Signs/Symptoms: estimated needs Interventions: Hormel Shake, Tube feeding, Other (Comment), Calorie Count (banatrol)  BMI: Estimated body mass index is 31.52 kg/m as calculated from the following:   Height as of this encounter: 5\' 3"  (1.6 m).   Weight as of this encounter: 80.7 kg.   Code status:   Code Status: Full Code   DVT Prophylaxis: Place and maintain sequential compression device Start: 06/18/23 1419 SCDs Start: 06/04/23 0753   Family Communication: Kealee Heinly [phone] on 9/17   Disposition Plan: Status is: Inpatient Remains inpatient appropriate because: Severity of illness   Planned Discharge Destination:SNF over the next several days-if no further hematochezia  Diet: Diet Order             Diet full liquid Room service appropriate? Yes; Fluid consistency: Thin  Diet effective now                     Antimicrobial agents: Anti-infectives (From admission, onward)    Start     Dose/Rate Route Frequency Ordered Stop   06/04/23 0830  cefTRIAXone (ROCEPHIN) 2 g in sodium chloride 0.9 % 100 mL IVPB        2 g 200 mL/hr over 30 Minutes Intravenous Every 24 hours 06/04/23 0829 06/10/23 0935   06/04/23 0800  doxycycline (VIBRAMYCIN) 100 mg in sodium chloride 0.9 % 250 mL IVPB  Status:  Discontinued        100 mg 125 mL/hr over 120 Minutes Intravenous Every 12 hours 06/04/23 0735 06/05/23 1000   06/04/23 0645  vancomycin (VANCOCIN) IVPB 1000 mg/200 mL premix  Status:  Discontinued        1,000 mg 200 mL/hr over 60 Minutes  Intravenous  Once 06/04/23 0630 06/04/23 0631   06/04/23 0645  ceFEPIme (MAXIPIME) 2 g in sodium chloride 0.9 % 100 mL IVPB        2 g 200 mL/hr over 30 Minutes Intravenous  Once 06/04/23 0630 06/04/23 0729   06/04/23 0645  vancomycin (VANCOREADY) IVPB 1250 mg/250 mL        1,250 mg 166.7 mL/hr over 90 Minutes Intravenous  Once 06/04/23 0631 06/04/23 0949        MEDICATIONS: Scheduled Meds:  atorvastatin  40 mg Oral Daily   Chlorhexidine Gluconate Cloth  6 each Topical Daily   feeding supplement  237 mL Oral TID WC   fiber supplement (BANATROL TF)  60 mL Oral TID   Gerhardt's butt cream   Topical QID   levETIRAcetam  500 mg Oral BID   lipase/protease/amylase  24,000 Units Oral TID AC   melatonin  3 mg Oral QHS   metoprolol tartrate  100 mg Oral BID   mirtazapine  15 mg Oral QHS   mouth rinse  15 mL Mouth Rinse 4 times per day   pantoprazole (PROTONIX) IV  40 mg Intravenous Q24H   Vivonex RTF  1,000 mL Per Tube Q24H   Continuous Infusions:  sodium chloride Stopped (06/13/23 0636)   PRN Meds:.acetaminophen, docusate, hydrALAZINE, ipratropium-albuterol, loperamide, metoprolol tartrate, ondansetron (ZOFRAN) IV, mouth rinse, oxyCODONE-acetaminophen, polyethylene glycol, senna-docusate, traZODone   I have personally reviewed  following labs and imaging studies  LABORATORY DATA: CBC: Recent Labs  Lab 06/27/23 0711 06/29/23 0937 07/01/23 0621 07/01/23 1059 07/01/23 1606 07/01/23 2207 07/02/23 0338  WBC 17.9* 10.4 16.6* 16.6*  --   --  13.2*  HGB 9.5* 9.0* 8.8* 8.1* 8.8* 7.6* 8.2*  HCT 28.6* 27.7* 28.1* 25.4* 27.0* 23.7* 26.0*  MCV 91.4 95.8 94.3 94.4  --   --  93.2  PLT 207 197 225 232  --   --  235    Basic Metabolic Panel: Recent Labs  Lab 06/27/23 0711 06/29/23 0937 07/01/23 0621 07/02/23 0338  NA 135 133* 133* 134*  K 3.8 4.5 4.2 4.4  CL 107 106 108 105  CO2 18* 17* 20* 21*  GLUCOSE 144* 155* 125* 107*  BUN 36* 32* 28* 27*  CREATININE 1.49* 1.40* 1.70*  1.74*  CALCIUM 8.7* 8.5* 8.3* 8.5*  MG  --  1.6* 1.7  --     GFR: Estimated Creatinine Clearance: 27.7 mL/min (A) (by C-G formula based on SCr of 1.74 mg/dL (H)).  Liver Function Tests: No results for input(s): "AST", "ALT", "ALKPHOS", "BILITOT", "PROT", "ALBUMIN" in the last 168 hours. No results for input(s): "LIPASE", "AMYLASE" in the last 168 hours. No results for input(s): "AMMONIA" in the last 168 hours.  Coagulation Profile: No results for input(s): "INR", "PROTIME" in the last 168 hours.  Cardiac Enzymes: No results for input(s): "CKTOTAL", "CKMB", "CKMBINDEX", "TROPONINI" in the last 168 hours.  BNP (last 3 results) No results for input(s): "PROBNP" in the last 8760 hours.  Lipid Profile: No results for input(s): "CHOL", "HDL", "LDLCALC", "TRIG", "CHOLHDL", "LDLDIRECT" in the last 72 hours.   Thyroid Function Tests: No results for input(s): "TSH", "T4TOTAL", "FREET4", "T3FREE", "THYROIDAB" in the last 72 hours.  Anemia Panel: No results for input(s): "VITAMINB12", "FOLATE", "FERRITIN", "TIBC", "IRON", "RETICCTPCT" in the last 72 hours.  Urine analysis:    Component Value Date/Time   COLORURINE AMBER (A) 04/15/2023 2010   APPEARANCEUR CLOUDY (A) 04/15/2023 2010   LABSPEC 1.017 04/15/2023 2010   PHURINE 5.0 04/15/2023 2010   GLUCOSEU NEGATIVE 04/15/2023 2010   HGBUR LARGE (A) 04/15/2023 2010   BILIRUBINUR NEGATIVE 04/15/2023 2010   KETONESUR NEGATIVE 04/15/2023 2010   PROTEINUR 100 (A) 04/15/2023 2010   NITRITE NEGATIVE 04/15/2023 2010   LEUKOCYTESUR LARGE (A) 04/15/2023 2010    Sepsis Labs: Lactic Acid, Venous    Component Value Date/Time   LATICACIDVEN 1.3 06/05/2023 1650    MICROBIOLOGY: No results found for this or any previous visit (from the past 240 hour(s)).   RADIOLOGY STUDIES/RESULTS: No results found.   LOS: 28 days   Jeoffrey Massed, MD  [facility]    To contact the attending provider between 7A-7P or the covering  provider during after hours 7P-7A, please log into the web site [email or web address] and access using universal [place] password for that web site. If you do not have the password, please call the hospital operator.  07/02/2023, 11:41 AM

## 2023-07-02 NOTE — Plan of Care (Signed)
Pt has rested quietly throughout the night with no distress noted. Alert and oriented. On room air. SR. [place] to suction. Had one loose BM. No bleeding noted. Medicated for sleep and pain with relief noted. No other complaints voiced.     Problem: Education: Goal: Knowledge of General Education information will improve Description: Including pain rating scale, medication(s)/side effects and non-pharmacologic comfort measures Outcome: Progressing   Problem: Health Behavior/Discharge Planning: Goal: Ability to manage health-related needs will improve Outcome: Progressing   Problem: Clinical Measurements: Goal: Ability to maintain clinical measurements within normal limits will improve Outcome: Progressing Goal: Will remain free from infection Outcome: Progressing Goal: Diagnostic test results will improve Outcome: Progressing Goal: Respiratory complications will improve Outcome: Progressing Goal: Cardiovascular complication will be avoided Outcome: Progressing   Problem: Activity: Goal: Risk for activity intolerance will decrease Outcome: Progressing   Problem: Nutrition: Goal: Adequate nutrition will be maintained Outcome: Progressing   Problem: Coping: Goal: Level of anxiety will decrease Outcome: Progressing   Problem: Elimination: Goal: Will not experience complications related to bowel motility Outcome: Progressing Goal: Will not experience complications related to urinary retention Outcome: Progressing   Problem: Pain Managment: Goal: General experience of comfort will improve Outcome: Progressing   Problem: Safety: Goal: Ability to remain free from injury will improve Outcome: Progressing   Problem: Skin Integrity: Goal: Risk for impaired skin integrity will decrease Outcome: Progressing   Problem: Education: Goal: Ability to describe self-care measures that may prevent or decrease complications (Diabetes Survival Skills Education) will improve Outcome:  Progressing Goal: Individualized Educational Video(s) Outcome: Progressing   Problem: Coping: Goal: Ability to adjust to condition or change in health will improve Outcome: Progressing   Problem: Fluid Volume: Goal: Ability to maintain a balanced intake and output will improve Outcome: Progressing   Problem: Health Behavior/Discharge Planning: Goal: Ability to identify and utilize available resources and services will improve Outcome: Progressing Goal: Ability to manage health-related needs will improve Outcome: Progressing   Problem: Metabolic: Goal: Ability to maintain appropriate glucose levels will improve Outcome: Progressing   Problem: Nutritional: Goal: Maintenance of adequate nutrition will improve Outcome: Progressing Goal: Progress toward achieving an optimal weight will improve Outcome: Progressing   Problem: Skin Integrity: Goal: Risk for impaired skin integrity will decrease Outcome: Progressing   Problem: Tissue Perfusion: Goal: Adequacy of tissue perfusion will improve Outcome: Progressing   Problem: Education: Goal: Understanding of CV disease, CV risk reduction, and recovery process will improve Outcome: Progressing Goal: Individualized Educational Video(s) Outcome: Progressing   Problem: Activity: Goal: Ability to return to baseline activity level will improve Outcome: Progressing   Problem: Cardiovascular: Goal: Ability to achieve and maintain adequate cardiovascular perfusion will improve Outcome: Progressing Goal: Vascular access site(s) Level 0-1 will be maintained Outcome: Progressing   Problem: Health Behavior/Discharge Planning: Goal: Ability to safely manage health-related needs after discharge will improve Outcome: Progressing   Problem: Education: Goal: Knowledge of disease or condition will improve Outcome: Progressing Goal: Knowledge of secondary prevention will improve (MUST DOCUMENT ALL) Outcome: Progressing Goal: Knowledge  of patient specific risk factors will improve Loraine Leriche N/A or DELETE if not current risk factor) Outcome: Progressing   Problem: Ischemic Stroke/TIA Tissue Perfusion: Goal: Complications of ischemic stroke/TIA will be minimized Outcome: Progressing   Problem: Coping: Goal: Will verbalize positive feelings about self Outcome: Progressing Goal: Will identify appropriate support needs Outcome: Progressing   Problem: Health Behavior/Discharge Planning: Goal: Ability to manage health-related needs will improve Outcome: Progressing Goal: Goals will be collaboratively established with  patient/family Outcome: Progressing   Problem: Self-Care: Goal: Ability to participate in self-care as condition permits will improve Outcome: Progressing Goal: Verbalization of feelings and concerns over difficulty with self-care will improve Outcome: Progressing Goal: Ability to communicate needs accurately will improve Outcome: Progressing   Problem: Nutrition: Goal: Risk of aspiration will decrease Outcome: Progressing Goal: Dietary intake will improve Outcome: Progressing

## 2023-07-02 NOTE — Progress Notes (Signed)
[place] GASTROENTEROLOGY ROUNDING NOTE   Subjective: No further hematochezia or evidence of GI bleeding overnight/today.  Hemoglobin stable.  No other GI complaints or concerns.   Objective: Vital signs in last 24 hours: Temp:  [97.9 F (36.6 C)-98.6 F (37 C)] 98.6 F (37 C) (09/18 1900) Pulse Rate:  [66-85] 74 (09/18 1900) Resp:  [10-17] 16 (09/18 1900) BP: (157-179)/(39-79) 157/39 (09/18 1900) SpO2:  [95 %-98 %] 98 % (09/18 1900) Weight:  [80.7 kg] 80.7 kg (09/18 0500) Last BM Date : 07/02/23 General: NAD, pleasant Caucasian female Lungs:  CTA b/l, no w/r/r Heart:  RRR, no m/r/g Abdomen:  Soft, NT, ND, +BS   Intake/Output from previous day: 09/17 0701 - 09/18 0700 In: 480 [P.O.:480] Out: 1600 [Urine:1600] Intake/Output this shift: No intake/output data recorded.   Lab Results: Recent Labs    07/01/23 0621 07/01/23 1059 07/01/23 1606 07/02/23 0338 07/02/23 1044 07/02/23 1644  WBC 16.6* 16.6*  --  13.2*  --   --   HGB 8.8* 8.1*   < > 8.2* 8.8* 8.8*  PLT 225 232  --  235  --   --   MCV 94.3 94.4  --  93.2  --   --    < > = values in this interval not displayed.   BMET Recent Labs    07/01/23 0621 07/02/23 0338  NA 133* 134*  K 4.2 4.4  CL 108 105  CO2 20* 21*  GLUCOSE 125* 107*  BUN 28* 27*  CREATININE 1.70* 1.74*  CALCIUM 8.3* 8.5*   LFT No results for input(s): "PROT", "ALBUMIN", "AST", "ALT", "ALKPHOS", "BILITOT", "BILIDIR", "IBILI" in the last 72 hours. PT/INR No results for input(s): "INR" in the last 72 hours.    Imaging/Other results: DG Swallowing Func-Speech Pathology  Result Date: 07/02/2023 Table formatting from the original result was not included. Modified Barium Swallow Study Patient Details Name: ADREONA DEVARY MRN: [id number] Date of Birth: 1946/01/02 Today's Date: 07/02/2023 HPI/PMH: HPI: Pt is a 77 yo female presenting 8/21 with shortness of breath, cough, hypoxia. ETT 8/21-8/28. Pt was seen by SLP most recently in July 2023,  recommending regular solids and thin liquids but noting lingual abnormalities and reduced ROM secondary to lingual trauma). Esophagram (November 2021) showed penetration of thin liquids, sensed aspiration with thicker barium, mild esophageal dysmotility, and HH with mild narrowing of the GE junction.   FEES at outside hospital in 2019 [facility]. Imaging from [facility] in 2019 reviewed (report not available) with transient penetration and vallecular residue noted. PMH includes: GERD, stroke, recurrent PNA, seizures, sleep apnea, DM, HTN, DVT, B12 deficiency, CAD, colostomy, chronic pancreatitis Clinical Impression: Clinical Impression: Pt continues to present with presumed acute oropharyngeal dysphagia, although with seemingly notably improved oral phase. Pt with only trace oral residue, increased control during bolus hold, and more organized lingual movement. Pharyngeally, pt continues to present with only partial anterior hyoid movement and laryngeal elevation, although presents with complete epiglottic inversion this date. With thin liquids, pt has gross penetration and eventual trace aspiration, although with apparently improved sensation, as she initiated an ineffective cough (PAS 7). In subsequent trials of thin liquids, pt penetrated to the vocal folds but was able to clear material with a cued cough (PAS 5). The pill was given whole with purees based on pt's reports of doing this at baseline. The pill was noted to be slow moving during the esophageal sweep, requiring a subsequent bite of purees to clear. Subjectively, pt presents with improvements in oral  phase, although suspect acute pharyngeal deficits related to intubation and deconditioning. Despite attempts at reconditioning, do not feel that a diet upgrade is appropriate at this time given pt's history of frequent PNA. Recommend continuing current diet of Dys 3 textures with nectar thick liquids and ongoing SLP f/u. Factors that may increase risk of adverse event  in presence of aspiration Rubye Oaks & Clearance Coots 2021): Factors that may increase risk of adverse event in presence of aspiration Rubye Oaks & Clearance Coots 2021): Respiratory or GI disease; Reduced cognitive function; Limited mobility; Frail or deconditioned; Dependence for feeding and/or oral hygiene Recommendations/Plan: Swallowing Evaluation Recommendations Swallowing Evaluation Recommendations Recommendations: PO diet PO Diet Recommendation: Dysphagia 3 (Mechanical soft); Mildly thick liquids (Level 2, nectar thick) Liquid Administration via: Cup; Straw Medication Administration: Whole meds with puree Supervision: Staff to assist with self-feeding Swallowing strategies  : Slow rate; Small bites/sips Postural changes: Position pt fully upright for meals; Stay upright 30-60 min after meals Oral care recommendations: Oral care BID (2x/day) Caregiver Recommendations: Avoid jello, ice cream, thin soups, popsicles; Remove water pitcher Treatment Plan Treatment Plan Treatment recommendations: Therapy as outlined in treatment plan below Follow-up recommendations: Skilled nursing-short term rehab (<3 hours/day) Functional status assessment: Patient has had a recent decline in their functional status and demonstrates the ability to make significant improvements in function in a reasonable and predictable amount of time. Treatment frequency: Min 2x/week Treatment duration: 2 weeks Interventions: Aspiration precaution training; Compensatory techniques; Patient/family education; Trials of upgraded texture/liquids; Diet toleration management by SLP Recommendations Recommendations for follow up therapy are one component of a multi-disciplinary discharge planning process, led by the attending physician.  Recommendations may be updated based on patient status, additional functional criteria and insurance authorization. Assessment: Orofacial Exam: Orofacial Exam Oral Cavity: Oral Hygiene: WFL Oral Cavity - Dentition: Adequate natural  dentition; Missing dentition Orofacial Anatomy: Other (comment) (lingual abnormalities at baseline) Oral Motor/Sensory Function: WFL Anatomy: Anatomy: WFL Boluses Administered: Boluses Administered Boluses Administered: Thin liquids (Level 0); Mildly thick liquids (Level 2, nectar thick); Moderately thick liquids (Level 3, honey thick); Puree; Solid  Oral Impairment Domain: Oral Impairment Domain Lip Closure: No labial escape Tongue control during bolus hold: Posterior escape of less than half of bolus Bolus preparation/mastication: Timely and efficient chewing and mashing Bolus transport/lingual motion: Brisk tongue motion Oral residue: Trace residue lining oral structures Location of oral residue : Tongue; Palate Initiation of pharyngeal swallow : Posterior laryngeal surface of the epiglottis  Pharyngeal Impairment Domain: Pharyngeal Impairment Domain Soft palate elevation: No bolus between soft palate (SP)/pharyngeal wall (PW) Laryngeal elevation: Partial superior movement of thyroid cartilage/partial approximation of arytenoids to epiglottic petiole Anterior hyoid excursion: Partial anterior movement Epiglottic movement: Complete inversion Laryngeal vestibule closure: Incomplete, narrow column air/contrast in laryngeal vestibule Pharyngeal stripping wave : Present - diminished Pharyngeal contraction (A/P view only): N/A Pharyngoesophageal segment opening: Complete distension and complete duration, no obstruction of flow Tongue base retraction: Trace column of contrast or air between tongue base and PPW Pharyngeal residue: Trace residue within or on pharyngeal structures Location of pharyngeal residue: Tongue base; Valleculae  Esophageal Impairment Domain: Esophageal Impairment Domain Esophageal clearance upright position: Esophageal retention Pill: Pill Consistency administered: Puree Puree: Impaired (see clinical impressions) Penetration/Aspiration Scale Score: Penetration/Aspiration Scale Score 1.  Material  does not enter airway: Mildly thick liquids (Level 2, nectar thick); Moderately thick liquids (Level 3, honey thick); Puree; Solid; Pill 7.  Material enters airway, passes BELOW cords and not ejected out despite cough attempt by patient: Thin liquids (Level  0) Compensatory Strategies: Compensatory Strategies Compensatory strategies: Yes Straw: Effective; Ineffective Effective Straw: Thin liquid (Level 0); Mildly thick liquid (Level 2, nectar thick) Ineffective Straw: Thin liquid (Level 0)   General Information: Caregiver present: No  Diet Prior to this Study: Dysphagia 3 (mechanical soft); Mildly thick liquids (Level 2, nectar thick)   Temperature : Normal   Respiratory Status: WFL   Supplemental O2: None (Room air)   History of Recent Intubation: Yes  Behavior/Cognition: Alert; Cooperative; Requires cueing; Pleasant mood Self-Feeding Abilities: Needs assist with self-feeding Baseline vocal quality/speech: Dysphonic Volitional Cough: Able to elicit Volitional Swallow: Able to elicit Exam Limitations: No limitations Goal Planning: Prognosis for improved oropharyngeal function: Good Barriers to Reach Goals: Cognitive deficits; Time post onset No data recorded Patient/Family Stated Goal: none stated Consulted and agree with results and recommendations: Patient Pain: Pain Assessment Pain Assessment: No/denies pain Pain Intervention(s): Monitored during session End of Session: Start Time:SLP Start Time (ACUTE ONLY): 1204 Stop Time: SLP Stop Time (ACUTE ONLY): 1219 Time Calculation:SLP Time Calculation (min) (ACUTE ONLY): 15 min Charges: SLP Evaluations $ SLP Speech Visit: 1 Visit SLP Evaluations $MBS Swallow: 1 Procedure $Swallowing Treatment: 1 Procedure SLP visit diagnosis: SLP Visit Diagnosis: Dysphagia, oropharyngeal phase (R13.12) Past Medical History: Past Medical History: Diagnosis Date  Anxiety   Arthritis   back   Blind loop syndrome   Clotting disorder (HCC)   Hx Clot in leg per pt   Colostomy present (HCC)    Coronary artery disease   a. known 60-70% mid-LAD stenosis with caths in 1999, 2002, 2006, and 2012 showing stable anatomy b. low-risk NST in 10/2015  Depression   Diabetes insipidus (HCC)   Diabetes mellitus   Diverticulosis   Dizziness   chronic  Dyslipidemia   Fatty liver   GERD (gastroesophageal reflux disease)   hiatial hernia  Heart murmur   Hiatal hernia   Hypertension   Irritable bowel syndrome   Pancreatitis, chronic (HCC)   Peripheral vascular disease (HCC) 02/20/2010  s/p stent of left SFA; carotid stenosis  Seizure (HCC) 11/04/2018  last 1 yr 2020 per pt unsure month   Sleep apnea   no cpap   Stroke (HCC)   Thyroid nodule   Vitamin B12 deficiency  Past Surgical History: Past Surgical History: Procedure Laterality Date  ABDOMINAL HYSTERECTOMY    partial  AUGMENTATION MAMMAPLASTY    bilateral 1976 retro   BREAST ENHANCEMENT SURGERY    CARDIAC CATHETERIZATION  07/07/98;08/31/01;03/04/05  60 -70% LAD  CATARACT EXTRACTION Bilateral   COLON RESECTION N/A 10/14/2017  Procedure: EXPLORATORY LAPAROTOMY, EXTENDED RIGHT COLECTOMY; APPLICATION OF ABDOMINAL VACUUM DRESSING;  Surgeon: Griselda Miner, MD;  Location: [facility] ORS;  Service: General;  Laterality: N/A;  COLONOSCOPY    CORONARY ATHERECTOMY N/A 02/12/2023  Procedure: CORONARY ATHERECTOMY;  Surgeon: Swaziland, Peter M, MD;  Location: [facility] INVASIVE CV LAB;  Service: Cardiovascular;  Laterality: N/A;  CORONARY STENT INTERVENTION N/A 02/12/2023  Procedure: CORONARY STENT INTERVENTION;  Surgeon: Swaziland, Peter M, MD;  Location: [facility] INVASIVE CV LAB;  Service: Cardiovascular;  Laterality: N/A;  CORONARY ULTRASOUND/IVUS N/A 02/12/2023  Procedure: Coronary Ultrasound/IVUS;  Surgeon: Swaziland, Peter M, MD;  Location: [facility] INVASIVE CV LAB;  Service: Cardiovascular;  Laterality: N/A;  ENDOBRONCHIAL ULTRASOUND Bilateral 02/24/2017  Procedure: ENDOBRONCHIAL ULTRASOUND;  Surgeon: Leslye Peer, MD;  Location: [facility] ENDOSCOPY;  Service: Cardiopulmonary;  Laterality: Bilateral;  FEMORAL ARTERY STENT     Left leg  LAPAROTOMY N/A 10/16/2017  Procedure: EXPLORATORY LAPAROTOMY, PARTIAL OMENTECTOMY, RESECTION ISCHEMIC ILEUM 130CM,  APPLICATION OF VAC ABDOMINAL DRESSING;  Surgeon: Darnell Level, MD;  Location: [facility]S;  Service: General;  Laterality: N/A;  LAPAROTOMY N/A 10/18/2017  Procedure: RE-EXPLORATION OF ABDOMEN, ILEOSTOMY CREATION;  Surgeon: Almond Lint, MD;  Location: [facility] ORS;  Service: General;  Laterality: N/A;  LEFT HEART CATH AND CORONARY ANGIOGRAPHY N/A 01/29/2023  Procedure: LEFT HEART CATH AND CORONARY ANGIOGRAPHY;  Surgeon: Swaziland, Peter M, MD;  Location: [facility] INVASIVE CV LAB;  Service: Cardiovascular;  Laterality: N/A;  LUNG SURGERY  03/2017  Benign polyps removed  PATELLA REALIGNMENT Left   POLYPECTOMY    SIGMOIDOSCOPY    TONSILLECTOMY    TRANSCAROTID ARTERY REVASCULARIZATION  Right 04/18/2022  Procedure: Transcarotid Artery Revascularization;  Surgeon: Victorino Sparrow, MD;  Location: [facility] OR;  Service: Vascular;  Laterality: Right; Gwynneth Aliment, M.A., CF-SLP Speech Language Pathology, Acute Rehabilitation Services Secure Chat preferred [phone] 07/02/2023, 3:23 PM     Assessment and Plan:  77 year old female with numerous medical problems, admitted with respiratory failure secondary to pneumonia requiring intubation, complicated by stroke requiring tube feeds. She also has a history of seizure disorder and coronary artery disease with a stent placed in May of this year as well as CKD stage IV.  She experienced a large painless grossly bloody bowel movement with a slight drop in hemoglobin.  Presentation was most consistent with limited diverticular bleed.  She has not had any further bleeding in the past 24 hours, and her hemoglobin has been stable.  She has a history of right extended hemicolectomy for ischemic colitis, and her most recent colonoscopy was unsuccessful due to restricted colonic mobility/narrowing.  Only 25 cm of the colon was visualized.   Hopefully, bleeding has resolved.   No plans for endoscopic evaluation at this time.  Patient does demonstrate further evidence of GI bleeding, would recommend CT-A and IR consultation.  Likely okay to restart aspirin and Brilinta tomorrow.  GI will sign off at that time.  Please reconsult if we can be of further assistance.   Jenel Lucks, MD  07/02/2023, 9:14 PM [facility]

## 2023-07-03 DIAGNOSIS — I25810 Atherosclerosis of coronary artery bypass graft(s) without angina pectoris: Secondary | ICD-10-CM | POA: Diagnosis not present

## 2023-07-03 DIAGNOSIS — R278 Other lack of coordination: Secondary | ICD-10-CM | POA: Diagnosis not present

## 2023-07-03 DIAGNOSIS — D649 Anemia, unspecified: Secondary | ICD-10-CM | POA: Diagnosis not present

## 2023-07-03 DIAGNOSIS — K922 Gastrointestinal hemorrhage, unspecified: Secondary | ICD-10-CM | POA: Diagnosis not present

## 2023-07-03 DIAGNOSIS — M6281 Muscle weakness (generalized): Secondary | ICD-10-CM | POA: Diagnosis not present

## 2023-07-03 DIAGNOSIS — Z8673 Personal history of transient ischemic attack (TIA), and cerebral infarction without residual deficits: Secondary | ICD-10-CM | POA: Diagnosis not present

## 2023-07-03 DIAGNOSIS — I4891 Unspecified atrial fibrillation: Secondary | ICD-10-CM | POA: Diagnosis not present

## 2023-07-03 DIAGNOSIS — I5042 Chronic combined systolic (congestive) and diastolic (congestive) heart failure: Secondary | ICD-10-CM | POA: Diagnosis not present

## 2023-07-03 DIAGNOSIS — I6330 Cerebral infarction due to thrombosis of unspecified cerebral artery: Secondary | ICD-10-CM | POA: Diagnosis not present

## 2023-07-03 DIAGNOSIS — E1151 Type 2 diabetes mellitus with diabetic peripheral angiopathy without gangrene: Secondary | ICD-10-CM | POA: Diagnosis not present

## 2023-07-03 DIAGNOSIS — R41841 Cognitive communication deficit: Secondary | ICD-10-CM | POA: Diagnosis not present

## 2023-07-03 DIAGNOSIS — Z741 Need for assistance with personal care: Secondary | ICD-10-CM | POA: Diagnosis not present

## 2023-07-03 DIAGNOSIS — I2510 Atherosclerotic heart disease of native coronary artery without angina pectoris: Secondary | ICD-10-CM | POA: Diagnosis not present

## 2023-07-03 DIAGNOSIS — I5023 Acute on chronic systolic (congestive) heart failure: Secondary | ICD-10-CM | POA: Diagnosis not present

## 2023-07-03 DIAGNOSIS — I639 Cerebral infarction, unspecified: Secondary | ICD-10-CM | POA: Diagnosis not present

## 2023-07-03 DIAGNOSIS — J9601 Acute respiratory failure with hypoxia: Secondary | ICD-10-CM | POA: Diagnosis not present

## 2023-07-03 DIAGNOSIS — N184 Chronic kidney disease, stage 4 (severe): Secondary | ICD-10-CM | POA: Diagnosis not present

## 2023-07-03 DIAGNOSIS — R1312 Dysphagia, oropharyngeal phase: Secondary | ICD-10-CM | POA: Diagnosis not present

## 2023-07-03 DIAGNOSIS — G9341 Metabolic encephalopathy: Secondary | ICD-10-CM | POA: Diagnosis not present

## 2023-07-03 DIAGNOSIS — R569 Unspecified convulsions: Secondary | ICD-10-CM | POA: Diagnosis not present

## 2023-07-03 DIAGNOSIS — M6259 Muscle wasting and atrophy, not elsewhere classified, multiple sites: Secondary | ICD-10-CM | POA: Diagnosis not present

## 2023-07-03 DIAGNOSIS — I130 Hypertensive heart and chronic kidney disease with heart failure and stage 1 through stage 4 chronic kidney disease, or unspecified chronic kidney disease: Secondary | ICD-10-CM | POA: Diagnosis not present

## 2023-07-03 DIAGNOSIS — I5022 Chronic systolic (congestive) heart failure: Secondary | ICD-10-CM | POA: Diagnosis not present

## 2023-07-03 DIAGNOSIS — J159 Unspecified bacterial pneumonia: Secondary | ICD-10-CM | POA: Diagnosis not present

## 2023-07-03 LAB — GLUCOSE, CAPILLARY
Glucose-Capillary: 91 mg/dL (ref 70–99)
Glucose-Capillary: 91 mg/dL (ref 70–99)

## 2023-07-03 MED ORDER — CLOPIDOGREL BISULFATE 75 MG PO TABS
75.0000 mg | ORAL_TABLET | Freq: Every day | ORAL | Status: DC
  Administered 2023-07-03: 75 mg via ORAL
  Filled 2023-07-03: qty 1

## 2023-07-03 MED ORDER — ISOSORBIDE MONONITRATE ER 60 MG PO TB24
60.0000 mg | ORAL_TABLET | Freq: Every day | ORAL | Status: DC
  Administered 2023-07-03: 60 mg via ORAL
  Filled 2023-07-03: qty 1

## 2023-07-03 MED ORDER — AMLODIPINE BESYLATE 10 MG PO TABS
10.0000 mg | ORAL_TABLET | Freq: Every day | ORAL | Status: DC
  Administered 2023-07-03: 10 mg via ORAL
  Filled 2023-07-03: qty 1

## 2023-07-03 NOTE — Progress Notes (Signed)
Called [facility]... Spoke w/Stephanie F RN and report given... Answered all questions   Sts pt has been there and they know her well.   Pt is being p/u by Sophronia Simas w/wheelchair capability.

## 2023-07-03 NOTE — Progress Notes (Signed)
Occupational Therapy Treatment Patient Details Name: [name] MRN: [id number] DOB: Feb 13, 1946 Today's Date: 07/03/2023   History of present illness Pt is 77 year old presented to [facility] on  06/04/23 for acute respiratory failure due to PNA. Intubatd on admission. Extubated 8/28. Pt encephaloathic post extubation. 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   OT comments  Pt with slow, steady progression toward established OT goals; needing encouragement for participation in session and max education regarding importance of mobility in return to PLOF. Pt abel to perform 3 grooming tasks seated in recliner this session with rest breaks between due to poor activity tolerance. Basic transfer with RW with CGA. Will to continue to follow. Patient will benefit from continued inpatient follow up therapy, <3 hours/day       If plan is discharge home, recommend the following:  A lot of help with bathing/dressing/bathroom;Assistance with cooking/housework;Direct supervision/assist for medications management;Direct supervision/assist for financial management;Assist for transportation;Help with stairs or ramp for entrance;A lot of help with walking and/or transfers   Equipment Recommendations  Other (comment) (TBD next venue)    Recommendations for Other Services      Precautions / Restrictions Precautions Precautions: Fall Restrictions Weight Bearing Restrictions: No       Mobility Bed Mobility Overal bed mobility: Needs Assistance Bed Mobility: Supine to Sit     Supine to sit:  Supervision     General bed mobility comments: supervision for safety, no physical assist needed    Transfers Overall transfer level: Needs assistance Equipment used: Rolling walker (2 wheels) Transfers: Sit to/from Stand, Bed to chair/wheelchair/BSC Sit to Stand: Contact guard assist     Step pivot transfers: Contact guard assist     General transfer comment: for safety     Balance Overall balance assessment: Needs assistance Sitting-balance support: No upper extremity supported, Single extremity supported Sitting balance-Leahy Scale: Fair     Standing balance support: Bilateral upper extremity supported Standing balance-Leahy Scale: Poor Standing balance comment: reliant on RW                           ADL either performed or assessed with clinical judgement   ADL Overall ADL's : Needs assistance/impaired Eating/Feeding: Set up Eating/Feeding Details (indicate cue type and reason): Pt drinking from cup with water during session Grooming: Supervision/safety;Sitting;Oral care;Wash/dry face;Brushing hair Grooming Details (indicate cue type and reason): in recliner; min encouragement to sustain hair brushing secondary to fatiigue. Performing with RUE                 Toilet Transfer: Contact guard assist;Stand-pivot;Rolling walker (2 wheels) Toilet Transfer Details (indicate cue type and reason): SPT only; pt deferring further mobility           General ADL Comments: educated re: importance of mobility acutely    Extremity/Trunk Assessment Upper Extremity Assessment Upper Extremity Assessment: Generalized weakness (LUE shoulder pain with forward punches.)   Lower Extremity Assessment Lower Extremity Assessment: Defer to PT evaluation        Vision       Perception     Praxis  Cognition Arousal: Alert Behavior During Therapy: Flat affect Overall Cognitive Status: Impaired/Different from baseline Area of Impairment: Awareness,  Safety/judgement, Memory, Problem solving                     Memory: Decreased recall of precautions, Decreased short-term memory   Safety/Judgement: Decreased awareness of safety, Decreased awareness of deficits Awareness: Emergent Problem Solving: Slow processing, Requires verbal cues General Comments: Requiring encouragment to participate. Memory seems to be improving.        Exercises      Shoulder Instructions       General Comments VSS on RA. Pt MAP was elevated but lower afetr mobility than prior to mobility    Pertinent Vitals/ Pain       Pain Assessment Pain Assessment: Faces Faces Pain Scale: Hurts little more Pain Location: bottom especially with pericare Pain Descriptors / Indicators: Discomfort Pain Intervention(s): Limited activity within patient's tolerance, Monitored during session  Home Living                                          Prior Functioning/Environment              Frequency  Min 2X/week        Progress Toward Goals  OT Goals(current goals can now be found in the care plan section)  Progress towards OT goals: Progressing toward goals  Acute Rehab OT Goals Patient Stated Goal: none stated OT Goal Formulation: With patient Time For Goal Achievement: 07/17/23 Potential to Achieve Goals: Good  Plan Discharge plan remains appropriate    Co-evaluation                 AM-PAC OT "6 Clicks" Daily Activity     Outcome Measure   Help from another person eating meals?: A Little Help from another person taking care of personal grooming?: A Little Help from another person toileting, which includes using toliet, bedpan, or urinal?: A Lot Help from another person bathing (including washing, rinsing, drying)?: A Lot Help from another person to put on and taking off regular upper body clothing?: A Little Help from another person to put on and taking off regular lower body clothing?: A Lot 6 Click Score:  15    End of Session Equipment Utilized During Treatment: Rolling walker (2 wheels)  OT Visit Diagnosis: Unsteadiness on feet (R26.81);Muscle weakness (generalized) (M62.81);Other symptoms and signs involving cognitive function   Activity Tolerance Patient tolerated treatment well   Patient Left in chair;with call bell/phone within reach;with chair alarm set   Nurse Communication Mobility status        Time: 4098-1191 OT Time Calculation (min): 12 min  Charges: OT General Charges $OT Visit: 1 Visit OT Treatments $Self Care/Home Management : 8-22 mins  [name], OTR/L [facility] Acute Rehabilitation Office: [phone]   [name] 07/03/2023, 10:16 AM

## 2023-07-03 NOTE — TOC Transition Note (Signed)
Transition of Care [facility]) - CM/SW Discharge Note   Patient Details  Name: [name] MRN: [id number] Date of Birth: Aug 30, 1946  [facility] CM/SW Contact:  Mearl Latin, LCSW Phone Number: 07/03/2023, 9:48 AM   Clinical Narrative:    Patient will DC to: [facility] Anticipated DC date: 07/03/23 Family notified: Son, Loraine Leriche Transport by: Phineas Semen transport ~10:20am   Per MD patient ready for DC to [facility]. RN to call report prior to discharge [phone] room 1202). RN, patient, patient's family, and facility notified of DC. Discharge Summary and FL2 sent to facility. Transport requested for patient.   CSW will sign off for now as social work intervention is no longer needed. Please consult [place] again if new needs arise.     Final next level of care: Skilled Nursing Facility Barriers to Discharge: Barriers Resolved   Patient Goals and CMS Choice   Choice offered to / list presented to : Patient  Discharge Placement                         Discharge Plan and Services Additional resources added to the After Visit Summary for   In-house Referral: Clinical Social Work   Post Acute Care Choice: Skilled Nursing Facility                               Social Determinants of Health (SDOH) Interventions SDOH Screenings   Food Insecurity: No Food Insecurity (07/01/2023)  Housing: Patient Unable To Answer (07/01/2023)  Transportation Needs: No Transportation Needs (07/01/2023)  Utilities: Not At Risk (07/01/2023)  Tobacco Use: Medium Risk (06/04/2023)     Readmission Risk Interventions    07/01/2023   12:56 PM 06/27/2023    8:42 AM 06/04/2023    3:49 PM  Readmission Risk Prevention Plan  Transportation Screening Complete Complete Complete  Medication Review (RN Care Manager)   Referral to Pharmacy  PCP or Specialist appointment within 3-5 days of discharge   Complete  HRI or Home Care Consult   Complete  SW Recovery Care/Counseling  Consult   Complete  Palliative Care Screening   Not Applicable  Skilled Nursing Facility   Not Applicable

## 2023-07-03 NOTE — Progress Notes (Signed)
Cardiology Note:   Agree with Plavix monotherapy. Would not restart ASA.  Please call with questions.   Verne Carrow, MD, [facility] 07/03/2023 9:15 AM

## 2023-07-03 NOTE — Plan of Care (Signed)
Pt has rested quietly throughout the night with no distress noted. Alert and oriented. Forgetful at times. On room air. SR. [place]wick to suction. Incontinent of bowel and bladder once, cleaned, skin care and bed change done. Medicated for sleep and pain with relief noted. No complaints voiced.     Problem: Education: Goal: Knowledge of General Education information will improve Description: Including pain rating scale, medication(s)/side effects and non-pharmacologic comfort measures Outcome: Progressing   Problem: Health Behavior/Discharge Planning: Goal: Ability to manage health-related needs will improve Outcome: Progressing   Problem: Clinical Measurements: Goal: Ability to maintain clinical measurements within normal limits will improve Outcome: Progressing Goal: Will remain free from infection Outcome: Progressing Goal: Diagnostic test results will improve Outcome: Progressing Goal: Respiratory complications will improve Outcome: Progressing Goal: Cardiovascular complication will be avoided Outcome: Progressing   Problem: Activity: Goal: Risk for activity intolerance will decrease Outcome: Progressing   Problem: Nutrition: Goal: Adequate nutrition will be maintained Outcome: Progressing   Problem: Coping: Goal: Level of anxiety will decrease Outcome: Progressing   Problem: Elimination: Goal: Will not experience complications related to bowel motility Outcome: Progressing Goal: Will not experience complications related to urinary retention Outcome: Progressing   Problem: Pain Managment: Goal: General experience of comfort will improve Outcome: Progressing   Problem: Safety: Goal: Ability to remain free from injury will improve Outcome: Progressing   Problem: Skin Integrity: Goal: Risk for impaired skin integrity will decrease Outcome: Progressing   Problem: Education: Goal: Ability to describe self-care measures that may prevent or decrease complications  (Diabetes Survival Skills Education) will improve Outcome: Progressing Goal: Individualized Educational Video(s) Outcome: Progressing   Problem: Coping: Goal: Ability to adjust to condition or change in health will improve Outcome: Progressing   Problem: Fluid Volume: Goal: Ability to maintain a balanced intake and output will improve Outcome: Progressing   Problem: Health Behavior/Discharge Planning: Goal: Ability to identify and utilize available resources and services will improve Outcome: Progressing Goal: Ability to manage health-related needs will improve Outcome: Progressing   Problem: Metabolic: Goal: Ability to maintain appropriate glucose levels will improve Outcome: Progressing   Problem: Nutritional: Goal: Maintenance of adequate nutrition will improve Outcome: Progressing Goal: Progress toward achieving an optimal weight will improve Outcome: Progressing   Problem: Skin Integrity: Goal: Risk for impaired skin integrity will decrease Outcome: Progressing   Problem: Tissue Perfusion: Goal: Adequacy of tissue perfusion will improve Outcome: Progressing   Problem: Education: Goal: Understanding of CV disease, CV risk reduction, and recovery process will improve Outcome: Progressing Goal: Individualized Educational Video(s) Outcome: Progressing   Problem: Activity: Goal: Ability to return to baseline activity level will improve Outcome: Progressing   Problem: Cardiovascular: Goal: Ability to achieve and maintain adequate cardiovascular perfusion will improve Outcome: Progressing Goal: Vascular access site(s) Level 0-1 will be maintained Outcome: Progressing   Problem: Health Behavior/Discharge Planning: Goal: Ability to safely manage health-related needs after discharge will improve Outcome: Progressing   Problem: Education: Goal: Knowledge of disease or condition will improve Outcome: Progressing Goal: Knowledge of secondary prevention will improve  (MUST DOCUMENT ALL) Outcome: Progressing Goal: Knowledge of patient specific risk factors will improve Loraine Leriche N/A or DELETE if not current risk factor) Outcome: Progressing   Problem: Ischemic Stroke/TIA Tissue Perfusion: Goal: Complications of ischemic stroke/TIA will be minimized Outcome: Progressing   Problem: Coping: Goal: Will verbalize positive feelings about self Outcome: Progressing Goal: Will identify appropriate support needs Outcome: Progressing   Problem: Health Behavior/Discharge Planning: Goal: Ability to manage health-related needs will improve Outcome:  Progressing Goal: Goals will be collaboratively established with patient/family Outcome: Progressing   Problem: Self-Care: Goal: Ability to participate in self-care as condition permits will improve Outcome: Progressing Goal: Verbalization of feelings and concerns over difficulty with self-care will improve Outcome: Progressing Goal: Ability to communicate needs accurately will improve Outcome: Progressing   Problem: Nutrition: Goal: Risk of aspiration will decrease Outcome: Progressing Goal: Dietary intake will improve Outcome: Progressing

## 2023-07-03 NOTE — Discharge Summary (Signed)
PATIENT DETAILS Name: [name] Age: 77 y.o. Sex: female Date of Birth: 01-19-1946 MRN: [id number]. Admitting Physician: Steffanie Dunn, DO WUJ:[id number], Barry Dienes, MD  Admit Date: 06/04/2023 Discharge date: 07/03/2023  Recommendations for Outpatient Follow-up:  Follow up with PCP in 1-2 weeks Please obtain CMP/CBC in one week  Admitted From:  Home  Disposition: Skilled nursing facility   Discharge Condition: good  CODE STATUS:   Code Status: Full Code   Diet recommendation:  Diet Order             Diet - low sodium heart healthy           DIET DYS 3 Room service appropriate? Yes with Assist; Fluid consistency: Nectar Thick  Diet effective now                    Brief Summary: Patient is a 77 y.o.  female with history of CVA, seizure, HTN, CAD s/p PCI May 2024, CKD 4, seizure disorder-presented to the ED on 8/21 with shortness of breath- found to have severe hypoxia-initially required BiPAP but subsequently intubated-admitted to the ICU-stabilized-extubated on 8/28-subsequently developed severe encephalopathy, found to have CVA, oropharyngeal dysphagia requiring NG tube feeds.  See below for further details.   Significant events: 8/21>> admitted, intubated 8/28>>extubated 9/02>> transferred to [facility] 9/07>> MRI significant for subacute CVA 9/16>>Cortrak tube removed 9/17>> hematochezia-GI/cardiology consult-DAPT stopped. 9/18>>brown stools 9/19>>plavix started   Significant studies: 8/21>> CXR: Multilobar right-sided PNA 8/21>> CT chest: Large airspace opacity involving right upper/lower lobes 8/30>> Echo: EF 60-65% 9/07>> MRI brain: Subacute infarct left parietal lobe 9/08>> MRI brain: Chronic occlusion left ICA 9/09>> carotid Doppler: Known chronic left ICA occlusion,<50% right carotid. 9/09>> LDL: 27 9/09>> A1c: 5.9   Significant microbiology data: 8/21>> COVID/influenza/RSV PCR: Negative 8/21>> respiratory virus panel: Negative 8/21>> tracheal  aspirate: Negative 8/21>> blood culture: Negative 9/03>> C. difficile studies: Negative   Procedures: 8/23>>Cortrak  Consults: PCCM Neuro Cardiology GI  Brief Hospital Course: Severe sepsis secondary to pneumonia (POA) Acute hypoxic respiratory failure Overall improved Completed a course of antibiotics Supportive care   Acute metabolic encephalopathy Slowly improving Supportive care Delirium precautions   AKI on CKD 4 AKI likely hemodynamically mediated Creatinine slowly improving and close to baseline Follow electrolytes periodically   Normocytic anemia Due to critical illness/CKD/significant ecchymosis/bruising No evidence of GI bleeding S/p 4 units of PRBC so far-last on 9/10 Follow CBC closely while at SNF   Lower GI bleeding Likely diverticular bleeding Occurred on 9/17 Thankfully self resolved-no hematochezia x 48 hours-in fact had brown stools on 9/18 CBC remained stable-no need for PRBC transfusion  CAD s/p PCI May 2024 Due to hematochezia-DAPT stopped 9/17  Appreciate cardiology input-once GI bleeding resolves-can do monotherapy with just Plavix Continue statin/beta-blocker.     Subacute CVA Workup as above Neuro recommending aspirin/Brilinta (as patient had CVA while on aspirin/Plavix)-however both agents held as patient developed lower GI bleeding-will resume Plavix on discharge (see above).  HTN BP creeping up Continue metoprolol Resume amlodipine/Imdur on discharge Resume hydralazine over the next several days at [facility] when able.   Seizure disorder Keppra   GERD PPI   Mood disorder/depression Overall stable On Remeron   Oropharyngeal dysphagia 2/2 acute illness/debility/deconditioning Required NG-tube feedings-NG tube was discontinued on 9/16-as oral intake significantly improved Continue dysphagia 3 diet-SLP following   Per prior note-IR PEG tube not possible due to ventral hernia-due to cardiac issues-aspirin/Brilinta-difficult for  surgical PEG.     11 mm right  Clementeen Graham, MD;  Location: [facility] OR;  Service: Vascular;  Laterality: Right; Gwynneth Aliment, M.A., CF-SLP Speech Language Pathology, Acute Rehabilitation Services Secure Chat preferred [phone] 07/02/2023, 3:23 PM  VAS US CAROTID  Result Date: 06/24/2023 Carotid Arterial Duplex Study Patient Name:  [name]  Date of Exam:   06/23/2023 Medical Rec #: [id number]       Accession #:    [id number] Date of Birth: 06-14-1946       Patient Gender: F Patient Age:   81 years Exam Location:  [facility] Procedure:      VAS US CAROTID Referring Phys: Elmer Picker --------------------------------------------------------------------------------  Indications:       Carotid artery disease and Right stent. Risk Factors:      Hypertension,  hyperlipidemia, Diabetes, coronary artery                    disease, prior CVA, PAD. Limitations        Today's exam was limited due to heavy calcification and the                    resulting shadowing and the patient's inability or                    unwillingness to cooperate. Comparison Study:  05/24/22 - Patent right ICA stent, known left chronic                    occlusion.                    12/26/21 - known left ICA occlusion. Performing Technologist: Shona Simpson  Examination Guidelines: A complete evaluation includes B-mode imaging, spectral Doppler, color Doppler, and power Doppler as needed of all accessible portions of each vessel. Bilateral testing is considered an integral part of a complete examination. Limited examinations for reoccurring indications may be performed as noted.  Right Carotid Findings: +----------+--------+--------+--------+------------------+------------+           PSV cm/sEDV cm/sStenosisPlaque DescriptionComments     +----------+--------+--------+--------+------------------+------------+ CCA Prox  160     23                                             +----------+--------+--------+--------+------------------+------------+ CCA Distal119     16              heterogenous                   +----------+--------+--------+--------+------------------+------------+ ICA Prox  107     21              heterogenous      Patent stent +----------+--------+--------+--------+------------------+------------+ ICA Mid   117     21                                             +----------+--------+--------+--------+------------------+------------+ ICA Distal113     27                                             +----------+--------+--------+--------+------------------+------------+ ECA       420  changed:  how much to take how to take this when to take this additional instructions        Contact information for after-discharge care     Destination     [facility] Preferred SNF .   Service: Skilled Nursing Contact information: [address] [place] [address] [phone]                    Allergies  Allergen Reactions   Adhesive [Tape] Other (See Comments)    "Took off my skin"   Other Rash    "Took off my skin"   Ace Inhibitors Other (See Comments)    Unknown   Clonidine     Resulted in excessive sleepiness/somnolence   Sulfa Antibiotics Other (See Comments)    Unknown   Topiramate     unknown   Codeine Other (See Comments)    Altered mental state    Iodinated Contrast Media Rash   Latex Rash     Other Procedures/Studies: DG Swallowing Func-Speech Pathology  Result Date: 07/02/2023 Table formatting from the original  result was not included. Modified Barium Swallow Study Patient Details Name: [name] MRN: [id number] Date of Birth: 01-Oct-1946 Today's Date: 07/02/2023 HPI/PMH: HPI: Pt is a 77 yo female presenting 8/21 with shortness of breath, cough, hypoxia. ETT 8/21-8/28. Pt was seen by SLP most recently in July 2023, recommending regular solids and thin liquids but noting lingual abnormalities and reduced ROM secondary to lingual trauma). Esophagram (November 2021) showed penetration of thin liquids, sensed aspiration with thicker barium, mild esophageal dysmotility, and HH with mild narrowing of the GE junction.   FEES at outside hospital in 2019 [facility]. Imaging from [facility] in 2019 reviewed (report not available) with transient penetration and vallecular residue noted. PMH includes: GERD, stroke, recurrent PNA, seizures, sleep apnea, DM, HTN, DVT, B12 deficiency, CAD, colostomy, chronic pancreatitis Clinical Impression: Clinical Impression: Pt continues to present with presumed acute oropharyngeal dysphagia, although with seemingly notably improved oral phase. Pt with only trace oral residue, increased control during bolus hold, and more organized lingual movement. Pharyngeally, pt continues to present with only partial anterior hyoid movement and laryngeal elevation, although presents with complete epiglottic inversion this date. With thin liquids, pt has gross penetration and eventual trace aspiration, although with apparently improved sensation, as she initiated an ineffective cough (PAS 7). In subsequent trials of thin liquids, pt penetrated to the vocal folds but was able to clear material with a cued cough (PAS 5). The pill was given whole with purees based on pt's reports of doing this at baseline. The pill was noted to be slow moving during the esophageal sweep, requiring a subsequent bite of purees to clear. Subjectively, pt presents with improvements in oral phase, although suspect acute pharyngeal deficits related to  intubation and deconditioning. Despite attempts at reconditioning, do not feel that a diet upgrade is appropriate at this time given pt's history of frequent PNA. Recommend continuing current diet of Dys 3 textures with nectar thick liquids and ongoing SLP f/u. Factors that may increase risk of adverse event in presence of aspiration Rubye Oaks & Clearance Coots 2021): Factors that may increase risk of adverse event in presence of aspiration Rubye Oaks & Clearance Coots 2021): Respiratory or GI disease; Reduced cognitive function; Limited mobility; Frail or deconditioned; Dependence for feeding and/or oral hygiene Recommendations/Plan: Swallowing Evaluation Recommendations Swallowing Evaluation Recommendations Recommendations: PO diet PO Diet Recommendation: Dysphagia 3 (Mechanical soft); Mildly thick liquids (Level 2,  PATIENT DETAILS Name: [name] Age: 77 y.o. Sex: female Date of Birth: 01-19-1946 MRN: [id number]. Admitting Physician: Steffanie Dunn, DO WUJ:[id number], Barry Dienes, MD  Admit Date: 06/04/2023 Discharge date: 07/03/2023  Recommendations for Outpatient Follow-up:  Follow up with PCP in 1-2 weeks Please obtain CMP/CBC in one week  Admitted From:  Home  Disposition: Skilled nursing facility   Discharge Condition: good  CODE STATUS:   Code Status: Full Code   Diet recommendation:  Diet Order             Diet - low sodium heart healthy           DIET DYS 3 Room service appropriate? Yes with Assist; Fluid consistency: Nectar Thick  Diet effective now                    Brief Summary: Patient is a 77 y.o.  female with history of CVA, seizure, HTN, CAD s/p PCI May 2024, CKD 4, seizure disorder-presented to the ED on 8/21 with shortness of breath- found to have severe hypoxia-initially required BiPAP but subsequently intubated-admitted to the ICU-stabilized-extubated on 8/28-subsequently developed severe encephalopathy, found to have CVA, oropharyngeal dysphagia requiring NG tube feeds.  See below for further details.   Significant events: 8/21>> admitted, intubated 8/28>>extubated 9/02>> transferred to [facility] 9/07>> MRI significant for subacute CVA 9/16>>Cortrak tube removed 9/17>> hematochezia-GI/cardiology consult-DAPT stopped. 9/18>>brown stools 9/19>>plavix started   Significant studies: 8/21>> CXR: Multilobar right-sided PNA 8/21>> CT chest: Large airspace opacity involving right upper/lower lobes 8/30>> Echo: EF 60-65% 9/07>> MRI brain: Subacute infarct left parietal lobe 9/08>> MRI brain: Chronic occlusion left ICA 9/09>> carotid Doppler: Known chronic left ICA occlusion,<50% right carotid. 9/09>> LDL: 27 9/09>> A1c: 5.9   Significant microbiology data: 8/21>> COVID/influenza/RSV PCR: Negative 8/21>> respiratory virus panel: Negative 8/21>> tracheal  aspirate: Negative 8/21>> blood culture: Negative 9/03>> C. difficile studies: Negative   Procedures: 8/23>>Cortrak  Consults: PCCM Neuro Cardiology GI  Brief Hospital Course: Severe sepsis secondary to pneumonia (POA) Acute hypoxic respiratory failure Overall improved Completed a course of antibiotics Supportive care   Acute metabolic encephalopathy Slowly improving Supportive care Delirium precautions   AKI on CKD 4 AKI likely hemodynamically mediated Creatinine slowly improving and close to baseline Follow electrolytes periodically   Normocytic anemia Due to critical illness/CKD/significant ecchymosis/bruising No evidence of GI bleeding S/p 4 units of PRBC so far-last on 9/10 Follow CBC closely while at SNF   Lower GI bleeding Likely diverticular bleeding Occurred on 9/17 Thankfully self resolved-no hematochezia x 48 hours-in fact had brown stools on 9/18 CBC remained stable-no need for PRBC transfusion  CAD s/p PCI May 2024 Due to hematochezia-DAPT stopped 9/17  Appreciate cardiology input-once GI bleeding resolves-can do monotherapy with just Plavix Continue statin/beta-blocker.     Subacute CVA Workup as above Neuro recommending aspirin/Brilinta (as patient had CVA while on aspirin/Plavix)-however both agents held as patient developed lower GI bleeding-will resume Plavix on discharge (see above).  HTN BP creeping up Continue metoprolol Resume amlodipine/Imdur on discharge Resume hydralazine over the next several days at [facility] when able.   Seizure disorder Keppra   GERD PPI   Mood disorder/depression Overall stable On Remeron   Oropharyngeal dysphagia 2/2 acute illness/debility/deconditioning Required NG-tube feedings-NG tube was discontinued on 9/16-as oral intake significantly improved Continue dysphagia 3 diet-SLP following   Per prior note-IR PEG tube not possible due to ventral hernia-due to cardiac issues-aspirin/Brilinta-difficult for  surgical PEG.     11 mm right  Clementeen Graham, MD;  Location: [facility] OR;  Service: Vascular;  Laterality: Right; Gwynneth Aliment, M.A., CF-SLP Speech Language Pathology, Acute Rehabilitation Services Secure Chat preferred [phone] 07/02/2023, 3:23 PM  VAS US CAROTID  Result Date: 06/24/2023 Carotid Arterial Duplex Study Patient Name:  [name]  Date of Exam:   06/23/2023 Medical Rec #: [id number]       Accession #:    [id number] Date of Birth: 06-14-1946       Patient Gender: F Patient Age:   81 years Exam Location:  [facility] Procedure:      VAS US CAROTID Referring Phys: Elmer Picker --------------------------------------------------------------------------------  Indications:       Carotid artery disease and Right stent. Risk Factors:      Hypertension,  hyperlipidemia, Diabetes, coronary artery                    disease, prior CVA, PAD. Limitations        Today's exam was limited due to heavy calcification and the                    resulting shadowing and the patient's inability or                    unwillingness to cooperate. Comparison Study:  05/24/22 - Patent right ICA stent, known left chronic                    occlusion.                    12/26/21 - known left ICA occlusion. Performing Technologist: Shona Simpson  Examination Guidelines: A complete evaluation includes B-mode imaging, spectral Doppler, color Doppler, and power Doppler as needed of all accessible portions of each vessel. Bilateral testing is considered an integral part of a complete examination. Limited examinations for reoccurring indications may be performed as noted.  Right Carotid Findings: +----------+--------+--------+--------+------------------+------------+           PSV cm/sEDV cm/sStenosisPlaque DescriptionComments     +----------+--------+--------+--------+------------------+------------+ CCA Prox  160     23                                             +----------+--------+--------+--------+------------------+------------+ CCA Distal119     16              heterogenous                   +----------+--------+--------+--------+------------------+------------+ ICA Prox  107     21              heterogenous      Patent stent +----------+--------+--------+--------+------------------+------------+ ICA Mid   117     21                                             +----------+--------+--------+--------+------------------+------------+ ICA Distal113     27                                             +----------+--------+--------+--------+------------------+------------+ ECA       420  Clementeen Graham, MD;  Location: [facility] OR;  Service: Vascular;  Laterality: Right; Gwynneth Aliment, M.A., CF-SLP Speech Language Pathology, Acute Rehabilitation Services Secure Chat preferred [phone] 07/02/2023, 3:23 PM  VAS US CAROTID  Result Date: 06/24/2023 Carotid Arterial Duplex Study Patient Name:  [name]  Date of Exam:   06/23/2023 Medical Rec #: [id number]       Accession #:    [id number] Date of Birth: 06-14-1946       Patient Gender: F Patient Age:   81 years Exam Location:  [facility] Procedure:      VAS US CAROTID Referring Phys: Elmer Picker --------------------------------------------------------------------------------  Indications:       Carotid artery disease and Right stent. Risk Factors:      Hypertension,  hyperlipidemia, Diabetes, coronary artery                    disease, prior CVA, PAD. Limitations        Today's exam was limited due to heavy calcification and the                    resulting shadowing and the patient's inability or                    unwillingness to cooperate. Comparison Study:  05/24/22 - Patent right ICA stent, known left chronic                    occlusion.                    12/26/21 - known left ICA occlusion. Performing Technologist: Shona Simpson  Examination Guidelines: A complete evaluation includes B-mode imaging, spectral Doppler, color Doppler, and power Doppler as needed of all accessible portions of each vessel. Bilateral testing is considered an integral part of a complete examination. Limited examinations for reoccurring indications may be performed as noted.  Right Carotid Findings: +----------+--------+--------+--------+------------------+------------+           PSV cm/sEDV cm/sStenosisPlaque DescriptionComments     +----------+--------+--------+--------+------------------+------------+ CCA Prox  160     23                                             +----------+--------+--------+--------+------------------+------------+ CCA Distal119     16              heterogenous                   +----------+--------+--------+--------+------------------+------------+ ICA Prox  107     21              heterogenous      Patent stent +----------+--------+--------+--------+------------------+------------+ ICA Mid   117     21                                             +----------+--------+--------+--------+------------------+------------+ ICA Distal113     27                                             +----------+--------+--------+--------+------------------+------------+ ECA       420  changed:  how much to take how to take this when to take this additional instructions        Contact information for after-discharge care     Destination     [facility] Preferred SNF .   Service: Skilled Nursing Contact information: [address] [place] [address] [phone]                    Allergies  Allergen Reactions   Adhesive [Tape] Other (See Comments)    "Took off my skin"   Other Rash    "Took off my skin"   Ace Inhibitors Other (See Comments)    Unknown   Clonidine     Resulted in excessive sleepiness/somnolence   Sulfa Antibiotics Other (See Comments)    Unknown   Topiramate     unknown   Codeine Other (See Comments)    Altered mental state    Iodinated Contrast Media Rash   Latex Rash     Other Procedures/Studies: DG Swallowing Func-Speech Pathology  Result Date: 07/02/2023 Table formatting from the original  result was not included. Modified Barium Swallow Study Patient Details Name: [name] MRN: [id number] Date of Birth: 01-Oct-1946 Today's Date: 07/02/2023 HPI/PMH: HPI: Pt is a 77 yo female presenting 8/21 with shortness of breath, cough, hypoxia. ETT 8/21-8/28. Pt was seen by SLP most recently in July 2023, recommending regular solids and thin liquids but noting lingual abnormalities and reduced ROM secondary to lingual trauma). Esophagram (November 2021) showed penetration of thin liquids, sensed aspiration with thicker barium, mild esophageal dysmotility, and HH with mild narrowing of the GE junction.   FEES at outside hospital in 2019 [facility]. Imaging from [facility] in 2019 reviewed (report not available) with transient penetration and vallecular residue noted. PMH includes: GERD, stroke, recurrent PNA, seizures, sleep apnea, DM, HTN, DVT, B12 deficiency, CAD, colostomy, chronic pancreatitis Clinical Impression: Clinical Impression: Pt continues to present with presumed acute oropharyngeal dysphagia, although with seemingly notably improved oral phase. Pt with only trace oral residue, increased control during bolus hold, and more organized lingual movement. Pharyngeally, pt continues to present with only partial anterior hyoid movement and laryngeal elevation, although presents with complete epiglottic inversion this date. With thin liquids, pt has gross penetration and eventual trace aspiration, although with apparently improved sensation, as she initiated an ineffective cough (PAS 7). In subsequent trials of thin liquids, pt penetrated to the vocal folds but was able to clear material with a cued cough (PAS 5). The pill was given whole with purees based on pt's reports of doing this at baseline. The pill was noted to be slow moving during the esophageal sweep, requiring a subsequent bite of purees to clear. Subjectively, pt presents with improvements in oral phase, although suspect acute pharyngeal deficits related to  intubation and deconditioning. Despite attempts at reconditioning, do not feel that a diet upgrade is appropriate at this time given pt's history of frequent PNA. Recommend continuing current diet of Dys 3 textures with nectar thick liquids and ongoing SLP f/u. Factors that may increase risk of adverse event in presence of aspiration Rubye Oaks & Clearance Coots 2021): Factors that may increase risk of adverse event in presence of aspiration Rubye Oaks & Clearance Coots 2021): Respiratory or GI disease; Reduced cognitive function; Limited mobility; Frail or deconditioned; Dependence for feeding and/or oral hygiene Recommendations/Plan: Swallowing Evaluation Recommendations Swallowing Evaluation Recommendations Recommendations: PO diet PO Diet Recommendation: Dysphagia 3 (Mechanical soft); Mildly thick liquids (Level 2,  PATIENT DETAILS Name: [name] Age: 77 y.o. Sex: female Date of Birth: 01-19-1946 MRN: [id number]. Admitting Physician: Steffanie Dunn, DO WUJ:[id number], Barry Dienes, MD  Admit Date: 06/04/2023 Discharge date: 07/03/2023  Recommendations for Outpatient Follow-up:  Follow up with PCP in 1-2 weeks Please obtain CMP/CBC in one week  Admitted From:  Home  Disposition: Skilled nursing facility   Discharge Condition: good  CODE STATUS:   Code Status: Full Code   Diet recommendation:  Diet Order             Diet - low sodium heart healthy           DIET DYS 3 Room service appropriate? Yes with Assist; Fluid consistency: Nectar Thick  Diet effective now                    Brief Summary: Patient is a 77 y.o.  female with history of CVA, seizure, HTN, CAD s/p PCI May 2024, CKD 4, seizure disorder-presented to the ED on 8/21 with shortness of breath- found to have severe hypoxia-initially required BiPAP but subsequently intubated-admitted to the ICU-stabilized-extubated on 8/28-subsequently developed severe encephalopathy, found to have CVA, oropharyngeal dysphagia requiring NG tube feeds.  See below for further details.   Significant events: 8/21>> admitted, intubated 8/28>>extubated 9/02>> transferred to [facility] 9/07>> MRI significant for subacute CVA 9/16>>Cortrak tube removed 9/17>> hematochezia-GI/cardiology consult-DAPT stopped. 9/18>>brown stools 9/19>>plavix started   Significant studies: 8/21>> CXR: Multilobar right-sided PNA 8/21>> CT chest: Large airspace opacity involving right upper/lower lobes 8/30>> Echo: EF 60-65% 9/07>> MRI brain: Subacute infarct left parietal lobe 9/08>> MRI brain: Chronic occlusion left ICA 9/09>> carotid Doppler: Known chronic left ICA occlusion,<50% right carotid. 9/09>> LDL: 27 9/09>> A1c: 5.9   Significant microbiology data: 8/21>> COVID/influenza/RSV PCR: Negative 8/21>> respiratory virus panel: Negative 8/21>> tracheal  aspirate: Negative 8/21>> blood culture: Negative 9/03>> C. difficile studies: Negative   Procedures: 8/23>>Cortrak  Consults: PCCM Neuro Cardiology GI  Brief Hospital Course: Severe sepsis secondary to pneumonia (POA) Acute hypoxic respiratory failure Overall improved Completed a course of antibiotics Supportive care   Acute metabolic encephalopathy Slowly improving Supportive care Delirium precautions   AKI on CKD 4 AKI likely hemodynamically mediated Creatinine slowly improving and close to baseline Follow electrolytes periodically   Normocytic anemia Due to critical illness/CKD/significant ecchymosis/bruising No evidence of GI bleeding S/p 4 units of PRBC so far-last on 9/10 Follow CBC closely while at SNF   Lower GI bleeding Likely diverticular bleeding Occurred on 9/17 Thankfully self resolved-no hematochezia x 48 hours-in fact had brown stools on 9/18 CBC remained stable-no need for PRBC transfusion  CAD s/p PCI May 2024 Due to hematochezia-DAPT stopped 9/17  Appreciate cardiology input-once GI bleeding resolves-can do monotherapy with just Plavix Continue statin/beta-blocker.     Subacute CVA Workup as above Neuro recommending aspirin/Brilinta (as patient had CVA while on aspirin/Plavix)-however both agents held as patient developed lower GI bleeding-will resume Plavix on discharge (see above).  HTN BP creeping up Continue metoprolol Resume amlodipine/Imdur on discharge Resume hydralazine over the next several days at [facility] when able.   Seizure disorder Keppra   GERD PPI   Mood disorder/depression Overall stable On Remeron   Oropharyngeal dysphagia 2/2 acute illness/debility/deconditioning Required NG-tube feedings-NG tube was discontinued on 9/16-as oral intake significantly improved Continue dysphagia 3 diet-SLP following   Per prior note-IR PEG tube not possible due to ventral hernia-due to cardiac issues-aspirin/Brilinta-difficult for  surgical PEG.     11 mm right  Clementeen Graham, MD;  Location: [facility] OR;  Service: Vascular;  Laterality: Right; Gwynneth Aliment, M.A., CF-SLP Speech Language Pathology, Acute Rehabilitation Services Secure Chat preferred [phone] 07/02/2023, 3:23 PM  VAS US CAROTID  Result Date: 06/24/2023 Carotid Arterial Duplex Study Patient Name:  [name]  Date of Exam:   06/23/2023 Medical Rec #: [id number]       Accession #:    [id number] Date of Birth: 06-14-1946       Patient Gender: F Patient Age:   81 years Exam Location:  [facility] Procedure:      VAS US CAROTID Referring Phys: Elmer Picker --------------------------------------------------------------------------------  Indications:       Carotid artery disease and Right stent. Risk Factors:      Hypertension,  hyperlipidemia, Diabetes, coronary artery                    disease, prior CVA, PAD. Limitations        Today's exam was limited due to heavy calcification and the                    resulting shadowing and the patient's inability or                    unwillingness to cooperate. Comparison Study:  05/24/22 - Patent right ICA stent, known left chronic                    occlusion.                    12/26/21 - known left ICA occlusion. Performing Technologist: Shona Simpson  Examination Guidelines: A complete evaluation includes B-mode imaging, spectral Doppler, color Doppler, and power Doppler as needed of all accessible portions of each vessel. Bilateral testing is considered an integral part of a complete examination. Limited examinations for reoccurring indications may be performed as noted.  Right Carotid Findings: +----------+--------+--------+--------+------------------+------------+           PSV cm/sEDV cm/sStenosisPlaque DescriptionComments     +----------+--------+--------+--------+------------------+------------+ CCA Prox  160     23                                             +----------+--------+--------+--------+------------------+------------+ CCA Distal119     16              heterogenous                   +----------+--------+--------+--------+------------------+------------+ ICA Prox  107     21              heterogenous      Patent stent +----------+--------+--------+--------+------------------+------------+ ICA Mid   117     21                                             +----------+--------+--------+--------+------------------+------------+ ICA Distal113     27                                             +----------+--------+--------+--------+------------------+------------+ ECA       420  PATIENT DETAILS Name: [name] Age: 77 y.o. Sex: female Date of Birth: 01-19-1946 MRN: [id number]. Admitting Physician: Steffanie Dunn, DO WUJ:[id number], Barry Dienes, MD  Admit Date: 06/04/2023 Discharge date: 07/03/2023  Recommendations for Outpatient Follow-up:  Follow up with PCP in 1-2 weeks Please obtain CMP/CBC in one week  Admitted From:  Home  Disposition: Skilled nursing facility   Discharge Condition: good  CODE STATUS:   Code Status: Full Code   Diet recommendation:  Diet Order             Diet - low sodium heart healthy           DIET DYS 3 Room service appropriate? Yes with Assist; Fluid consistency: Nectar Thick  Diet effective now                    Brief Summary: Patient is a 77 y.o.  female with history of CVA, seizure, HTN, CAD s/p PCI May 2024, CKD 4, seizure disorder-presented to the ED on 8/21 with shortness of breath- found to have severe hypoxia-initially required BiPAP but subsequently intubated-admitted to the ICU-stabilized-extubated on 8/28-subsequently developed severe encephalopathy, found to have CVA, oropharyngeal dysphagia requiring NG tube feeds.  See below for further details.   Significant events: 8/21>> admitted, intubated 8/28>>extubated 9/02>> transferred to [facility] 9/07>> MRI significant for subacute CVA 9/16>>Cortrak tube removed 9/17>> hematochezia-GI/cardiology consult-DAPT stopped. 9/18>>brown stools 9/19>>plavix started   Significant studies: 8/21>> CXR: Multilobar right-sided PNA 8/21>> CT chest: Large airspace opacity involving right upper/lower lobes 8/30>> Echo: EF 60-65% 9/07>> MRI brain: Subacute infarct left parietal lobe 9/08>> MRI brain: Chronic occlusion left ICA 9/09>> carotid Doppler: Known chronic left ICA occlusion,<50% right carotid. 9/09>> LDL: 27 9/09>> A1c: 5.9   Significant microbiology data: 8/21>> COVID/influenza/RSV PCR: Negative 8/21>> respiratory virus panel: Negative 8/21>> tracheal  aspirate: Negative 8/21>> blood culture: Negative 9/03>> C. difficile studies: Negative   Procedures: 8/23>>Cortrak  Consults: PCCM Neuro Cardiology GI  Brief Hospital Course: Severe sepsis secondary to pneumonia (POA) Acute hypoxic respiratory failure Overall improved Completed a course of antibiotics Supportive care   Acute metabolic encephalopathy Slowly improving Supportive care Delirium precautions   AKI on CKD 4 AKI likely hemodynamically mediated Creatinine slowly improving and close to baseline Follow electrolytes periodically   Normocytic anemia Due to critical illness/CKD/significant ecchymosis/bruising No evidence of GI bleeding S/p 4 units of PRBC so far-last on 9/10 Follow CBC closely while at SNF   Lower GI bleeding Likely diverticular bleeding Occurred on 9/17 Thankfully self resolved-no hematochezia x 48 hours-in fact had brown stools on 9/18 CBC remained stable-no need for PRBC transfusion  CAD s/p PCI May 2024 Due to hematochezia-DAPT stopped 9/17  Appreciate cardiology input-once GI bleeding resolves-can do monotherapy with just Plavix Continue statin/beta-blocker.     Subacute CVA Workup as above Neuro recommending aspirin/Brilinta (as patient had CVA while on aspirin/Plavix)-however both agents held as patient developed lower GI bleeding-will resume Plavix on discharge (see above).  HTN BP creeping up Continue metoprolol Resume amlodipine/Imdur on discharge Resume hydralazine over the next several days at [facility] when able.   Seizure disorder Keppra   GERD PPI   Mood disorder/depression Overall stable On Remeron   Oropharyngeal dysphagia 2/2 acute illness/debility/deconditioning Required NG-tube feedings-NG tube was discontinued on 9/16-as oral intake significantly improved Continue dysphagia 3 diet-SLP following   Per prior note-IR PEG tube not possible due to ventral hernia-due to cardiac issues-aspirin/Brilinta-difficult for  surgical PEG.     11 mm right  PATIENT DETAILS Name: [name] Age: 77 y.o. Sex: female Date of Birth: 01-19-1946 MRN: [id number]. Admitting Physician: Steffanie Dunn, DO WUJ:[id number], Barry Dienes, MD  Admit Date: 06/04/2023 Discharge date: 07/03/2023  Recommendations for Outpatient Follow-up:  Follow up with PCP in 1-2 weeks Please obtain CMP/CBC in one week  Admitted From:  Home  Disposition: Skilled nursing facility   Discharge Condition: good  CODE STATUS:   Code Status: Full Code   Diet recommendation:  Diet Order             Diet - low sodium heart healthy           DIET DYS 3 Room service appropriate? Yes with Assist; Fluid consistency: Nectar Thick  Diet effective now                    Brief Summary: Patient is a 77 y.o.  female with history of CVA, seizure, HTN, CAD s/p PCI May 2024, CKD 4, seizure disorder-presented to the ED on 8/21 with shortness of breath- found to have severe hypoxia-initially required BiPAP but subsequently intubated-admitted to the ICU-stabilized-extubated on 8/28-subsequently developed severe encephalopathy, found to have CVA, oropharyngeal dysphagia requiring NG tube feeds.  See below for further details.   Significant events: 8/21>> admitted, intubated 8/28>>extubated 9/02>> transferred to [facility] 9/07>> MRI significant for subacute CVA 9/16>>Cortrak tube removed 9/17>> hematochezia-GI/cardiology consult-DAPT stopped. 9/18>>brown stools 9/19>>plavix started   Significant studies: 8/21>> CXR: Multilobar right-sided PNA 8/21>> CT chest: Large airspace opacity involving right upper/lower lobes 8/30>> Echo: EF 60-65% 9/07>> MRI brain: Subacute infarct left parietal lobe 9/08>> MRI brain: Chronic occlusion left ICA 9/09>> carotid Doppler: Known chronic left ICA occlusion,<50% right carotid. 9/09>> LDL: 27 9/09>> A1c: 5.9   Significant microbiology data: 8/21>> COVID/influenza/RSV PCR: Negative 8/21>> respiratory virus panel: Negative 8/21>> tracheal  aspirate: Negative 8/21>> blood culture: Negative 9/03>> C. difficile studies: Negative   Procedures: 8/23>>Cortrak  Consults: PCCM Neuro Cardiology GI  Brief Hospital Course: Severe sepsis secondary to pneumonia (POA) Acute hypoxic respiratory failure Overall improved Completed a course of antibiotics Supportive care   Acute metabolic encephalopathy Slowly improving Supportive care Delirium precautions   AKI on CKD 4 AKI likely hemodynamically mediated Creatinine slowly improving and close to baseline Follow electrolytes periodically   Normocytic anemia Due to critical illness/CKD/significant ecchymosis/bruising No evidence of GI bleeding S/p 4 units of PRBC so far-last on 9/10 Follow CBC closely while at SNF   Lower GI bleeding Likely diverticular bleeding Occurred on 9/17 Thankfully self resolved-no hematochezia x 48 hours-in fact had brown stools on 9/18 CBC remained stable-no need for PRBC transfusion  CAD s/p PCI May 2024 Due to hematochezia-DAPT stopped 9/17  Appreciate cardiology input-once GI bleeding resolves-can do monotherapy with just Plavix Continue statin/beta-blocker.     Subacute CVA Workup as above Neuro recommending aspirin/Brilinta (as patient had CVA while on aspirin/Plavix)-however both agents held as patient developed lower GI bleeding-will resume Plavix on discharge (see above).  HTN BP creeping up Continue metoprolol Resume amlodipine/Imdur on discharge Resume hydralazine over the next several days at [facility] when able.   Seizure disorder Keppra   GERD PPI   Mood disorder/depression Overall stable On Remeron   Oropharyngeal dysphagia 2/2 acute illness/debility/deconditioning Required NG-tube feedings-NG tube was discontinued on 9/16-as oral intake significantly improved Continue dysphagia 3 diet-SLP following   Per prior note-IR PEG tube not possible due to ventral hernia-due to cardiac issues-aspirin/Brilinta-difficult for  surgical PEG.     11 mm right  changed:  how much to take how to take this when to take this additional instructions        Contact information for after-discharge care     Destination     [facility] Preferred SNF .   Service: Skilled Nursing Contact information: [address] [place] [address] [phone]                    Allergies  Allergen Reactions   Adhesive [Tape] Other (See Comments)    "Took off my skin"   Other Rash    "Took off my skin"   Ace Inhibitors Other (See Comments)    Unknown   Clonidine     Resulted in excessive sleepiness/somnolence   Sulfa Antibiotics Other (See Comments)    Unknown   Topiramate     unknown   Codeine Other (See Comments)    Altered mental state    Iodinated Contrast Media Rash   Latex Rash     Other Procedures/Studies: DG Swallowing Func-Speech Pathology  Result Date: 07/02/2023 Table formatting from the original  result was not included. Modified Barium Swallow Study Patient Details Name: [name] MRN: [id number] Date of Birth: 01-Oct-1946 Today's Date: 07/02/2023 HPI/PMH: HPI: Pt is a 77 yo female presenting 8/21 with shortness of breath, cough, hypoxia. ETT 8/21-8/28. Pt was seen by SLP most recently in July 2023, recommending regular solids and thin liquids but noting lingual abnormalities and reduced ROM secondary to lingual trauma). Esophagram (November 2021) showed penetration of thin liquids, sensed aspiration with thicker barium, mild esophageal dysmotility, and HH with mild narrowing of the GE junction.   FEES at outside hospital in 2019 [facility]. Imaging from [facility] in 2019 reviewed (report not available) with transient penetration and vallecular residue noted. PMH includes: GERD, stroke, recurrent PNA, seizures, sleep apnea, DM, HTN, DVT, B12 deficiency, CAD, colostomy, chronic pancreatitis Clinical Impression: Clinical Impression: Pt continues to present with presumed acute oropharyngeal dysphagia, although with seemingly notably improved oral phase. Pt with only trace oral residue, increased control during bolus hold, and more organized lingual movement. Pharyngeally, pt continues to present with only partial anterior hyoid movement and laryngeal elevation, although presents with complete epiglottic inversion this date. With thin liquids, pt has gross penetration and eventual trace aspiration, although with apparently improved sensation, as she initiated an ineffective cough (PAS 7). In subsequent trials of thin liquids, pt penetrated to the vocal folds but was able to clear material with a cued cough (PAS 5). The pill was given whole with purees based on pt's reports of doing this at baseline. The pill was noted to be slow moving during the esophageal sweep, requiring a subsequent bite of purees to clear. Subjectively, pt presents with improvements in oral phase, although suspect acute pharyngeal deficits related to  intubation and deconditioning. Despite attempts at reconditioning, do not feel that a diet upgrade is appropriate at this time given pt's history of frequent PNA. Recommend continuing current diet of Dys 3 textures with nectar thick liquids and ongoing SLP f/u. Factors that may increase risk of adverse event in presence of aspiration Rubye Oaks [employer] Clearance Coots 2021): Factors that may increase risk of adverse event in presence of aspiration Rubye Oaks [employer] Clearance Coots 2021): Respiratory or GI disease; Reduced cognitive function; Limited mobility; Frail or deconditioned; Dependence for feeding and/or oral hygiene Recommendations/Plan: Swallowing Evaluation Recommendations Swallowing Evaluation Recommendations Recommendations: PO diet PO Diet Recommendation: Dysphagia 3 (Mechanical soft); Mildly thick liquids (Level 2,  Clementeen Graham, MD;  Location: [facility] OR;  Service: Vascular;  Laterality: Right; Gwynneth Aliment, M.A., CF-SLP Speech Language Pathology, Acute Rehabilitation Services Secure Chat preferred [phone] 07/02/2023, 3:23 PM  VAS US CAROTID  Result Date: 06/24/2023 Carotid Arterial Duplex Study Patient Name:  [name]  Date of Exam:   06/23/2023 Medical Rec #: [id number]       Accession #:    [id number] Date of Birth: 06-14-1946       Patient Gender: F Patient Age:   81 years Exam Location:  [facility] Procedure:      VAS US CAROTID Referring Phys: Elmer Picker --------------------------------------------------------------------------------  Indications:       Carotid artery disease and Right stent. Risk Factors:      Hypertension,  hyperlipidemia, Diabetes, coronary artery                    disease, prior CVA, PAD. Limitations        Today's exam was limited due to heavy calcification and the                    resulting shadowing and the patient's inability or                    unwillingness to cooperate. Comparison Study:  05/24/22 - Patent right ICA stent, known left chronic                    occlusion.                    12/26/21 - known left ICA occlusion. Performing Technologist: Shona Simpson  Examination Guidelines: A complete evaluation includes B-mode imaging, spectral Doppler, color Doppler, and power Doppler as needed of all accessible portions of each vessel. Bilateral testing is considered an integral part of a complete examination. Limited examinations for reoccurring indications may be performed as noted.  Right Carotid Findings: +----------+--------+--------+--------+------------------+------------+           PSV cm/sEDV cm/sStenosisPlaque DescriptionComments     +----------+--------+--------+--------+------------------+------------+ CCA Prox  160     23                                             +----------+--------+--------+--------+------------------+------------+ CCA Distal119     16              heterogenous                   +----------+--------+--------+--------+------------------+------------+ ICA Prox  107     21              heterogenous      Patent stent +----------+--------+--------+--------+------------------+------------+ ICA Mid   117     21                                             +----------+--------+--------+--------+------------------+------------+ ICA Distal113     27                                             +----------+--------+--------+--------+------------------+------------+ ECA       420  PATIENT DETAILS Name: [name] Age: 77 y.o. Sex: female Date of Birth: 01-19-1946 MRN: [id number]. Admitting Physician: Steffanie Dunn, DO WUJ:[id number], Barry Dienes, MD  Admit Date: 06/04/2023 Discharge date: 07/03/2023  Recommendations for Outpatient Follow-up:  Follow up with PCP in 1-2 weeks Please obtain CMP/CBC in one week  Admitted From:  Home  Disposition: Skilled nursing facility   Discharge Condition: good  CODE STATUS:   Code Status: Full Code   Diet recommendation:  Diet Order             Diet - low sodium heart healthy           DIET DYS 3 Room service appropriate? Yes with Assist; Fluid consistency: Nectar Thick  Diet effective now                    Brief Summary: Patient is a 77 y.o.  female with history of CVA, seizure, HTN, CAD s/p PCI May 2024, CKD 4, seizure disorder-presented to the ED on 8/21 with shortness of breath- found to have severe hypoxia-initially required BiPAP but subsequently intubated-admitted to the ICU-stabilized-extubated on 8/28-subsequently developed severe encephalopathy, found to have CVA, oropharyngeal dysphagia requiring NG tube feeds.  See below for further details.   Significant events: 8/21>> admitted, intubated 8/28>>extubated 9/02>> transferred to [facility] 9/07>> MRI significant for subacute CVA 9/16>>Cortrak tube removed 9/17>> hematochezia-GI/cardiology consult-DAPT stopped. 9/18>>brown stools 9/19>>plavix started   Significant studies: 8/21>> CXR: Multilobar right-sided PNA 8/21>> CT chest: Large airspace opacity involving right upper/lower lobes 8/30>> Echo: EF 60-65% 9/07>> MRI brain: Subacute infarct left parietal lobe 9/08>> MRI brain: Chronic occlusion left ICA 9/09>> carotid Doppler: Known chronic left ICA occlusion,<50% right carotid. 9/09>> LDL: 27 9/09>> A1c: 5.9   Significant microbiology data: 8/21>> COVID/influenza/RSV PCR: Negative 8/21>> respiratory virus panel: Negative 8/21>> tracheal  aspirate: Negative 8/21>> blood culture: Negative 9/03>> C. difficile studies: Negative   Procedures: 8/23>>Cortrak  Consults: PCCM Neuro Cardiology GI  Brief Hospital Course: Severe sepsis secondary to pneumonia (POA) Acute hypoxic respiratory failure Overall improved Completed a course of antibiotics Supportive care   Acute metabolic encephalopathy Slowly improving Supportive care Delirium precautions   AKI on CKD 4 AKI likely hemodynamically mediated Creatinine slowly improving and close to baseline Follow electrolytes periodically   Normocytic anemia Due to critical illness/CKD/significant ecchymosis/bruising No evidence of GI bleeding S/p 4 units of PRBC so far-last on 9/10 Follow CBC closely while at SNF   Lower GI bleeding Likely diverticular bleeding Occurred on 9/17 Thankfully self resolved-no hematochezia x 48 hours-in fact had brown stools on 9/18 CBC remained stable-no need for PRBC transfusion  CAD s/p PCI May 2024 Due to hematochezia-DAPT stopped 9/17  Appreciate cardiology input-once GI bleeding resolves-can do monotherapy with just Plavix Continue statin/beta-blocker.     Subacute CVA Workup as above Neuro recommending aspirin/Brilinta (as patient had CVA while on aspirin/Plavix)-however both agents held as patient developed lower GI bleeding-will resume Plavix on discharge (see above).  HTN BP creeping up Continue metoprolol Resume amlodipine/Imdur on discharge Resume hydralazine over the next several days at [facility] when able.   Seizure disorder Keppra   GERD PPI   Mood disorder/depression Overall stable On Remeron   Oropharyngeal dysphagia 2/2 acute illness/debility/deconditioning Required NG-tube feedings-NG tube was discontinued on 9/16-as oral intake significantly improved Continue dysphagia 3 diet-SLP following   Per prior note-IR PEG tube not possible due to ventral hernia-due to cardiac issues-aspirin/Brilinta-difficult for  surgical PEG.     11 mm right  PATIENT DETAILS Name: [name] Age: 77 y.o. Sex: female Date of Birth: 01-19-1946 MRN: [id number]. Admitting Physician: Steffanie Dunn, DO WUJ:[id number], Barry Dienes, MD  Admit Date: 06/04/2023 Discharge date: 07/03/2023  Recommendations for Outpatient Follow-up:  Follow up with PCP in 1-2 weeks Please obtain CMP/CBC in one week  Admitted From:  Home  Disposition: Skilled nursing facility   Discharge Condition: good  CODE STATUS:   Code Status: Full Code   Diet recommendation:  Diet Order             Diet - low sodium heart healthy           DIET DYS 3 Room service appropriate? Yes with Assist; Fluid consistency: Nectar Thick  Diet effective now                    Brief Summary: Patient is a 77 y.o.  female with history of CVA, seizure, HTN, CAD s/p PCI May 2024, CKD 4, seizure disorder-presented to the ED on 8/21 with shortness of breath- found to have severe hypoxia-initially required BiPAP but subsequently intubated-admitted to the ICU-stabilized-extubated on 8/28-subsequently developed severe encephalopathy, found to have CVA, oropharyngeal dysphagia requiring NG tube feeds.  See below for further details.   Significant events: 8/21>> admitted, intubated 8/28>>extubated 9/02>> transferred to [facility] 9/07>> MRI significant for subacute CVA 9/16>>Cortrak tube removed 9/17>> hematochezia-GI/cardiology consult-DAPT stopped. 9/18>>brown stools 9/19>>plavix started   Significant studies: 8/21>> CXR: Multilobar right-sided PNA 8/21>> CT chest: Large airspace opacity involving right upper/lower lobes 8/30>> Echo: EF 60-65% 9/07>> MRI brain: Subacute infarct left parietal lobe 9/08>> MRI brain: Chronic occlusion left ICA 9/09>> carotid Doppler: Known chronic left ICA occlusion,<50% right carotid. 9/09>> LDL: 27 9/09>> A1c: 5.9   Significant microbiology data: 8/21>> COVID/influenza/RSV PCR: Negative 8/21>> respiratory virus panel: Negative 8/21>> tracheal  aspirate: Negative 8/21>> blood culture: Negative 9/03>> C. difficile studies: Negative   Procedures: 8/23>>Cortrak  Consults: PCCM Neuro Cardiology GI  Brief Hospital Course: Severe sepsis secondary to pneumonia (POA) Acute hypoxic respiratory failure Overall improved Completed a course of antibiotics Supportive care   Acute metabolic encephalopathy Slowly improving Supportive care Delirium precautions   AKI on CKD 4 AKI likely hemodynamically mediated Creatinine slowly improving and close to baseline Follow electrolytes periodically   Normocytic anemia Due to critical illness/CKD/significant ecchymosis/bruising No evidence of GI bleeding S/p 4 units of PRBC so far-last on 9/10 Follow CBC closely while at SNF   Lower GI bleeding Likely diverticular bleeding Occurred on 9/17 Thankfully self resolved-no hematochezia x 48 hours-in fact had brown stools on 9/18 CBC remained stable-no need for PRBC transfusion  CAD s/p PCI May 2024 Due to hematochezia-DAPT stopped 9/17  Appreciate cardiology input-once GI bleeding resolves-can do monotherapy with just Plavix Continue statin/beta-blocker.     Subacute CVA Workup as above Neuro recommending aspirin/Brilinta (as patient had CVA while on aspirin/Plavix)-however both agents held as patient developed lower GI bleeding-will resume Plavix on discharge (see above).  HTN BP creeping up Continue metoprolol Resume amlodipine/Imdur on discharge Resume hydralazine over the next several days at [facility] when able.   Seizure disorder Keppra   GERD PPI   Mood disorder/depression Overall stable On Remeron   Oropharyngeal dysphagia 2/2 acute illness/debility/deconditioning Required NG-tube feedings-NG tube was discontinued on 9/16-as oral intake significantly improved Continue dysphagia 3 diet-SLP following   Per prior note-IR PEG tube not possible due to ventral hernia-due to cardiac issues-aspirin/Brilinta-difficult for  surgical PEG.     11 mm right  Clementeen Graham, MD;  Location: [facility] OR;  Service: Vascular;  Laterality: Right; Gwynneth Aliment, M.A., CF-SLP Speech Language Pathology, Acute Rehabilitation Services Secure Chat preferred [phone] 07/02/2023, 3:23 PM  VAS US CAROTID  Result Date: 06/24/2023 Carotid Arterial Duplex Study Patient Name:  [name]  Date of Exam:   06/23/2023 Medical Rec #: [id number]       Accession #:    [id number] Date of Birth: 06-14-1946       Patient Gender: F Patient Age:   81 years Exam Location:  [facility] Procedure:      VAS US CAROTID Referring Phys: Elmer Picker --------------------------------------------------------------------------------  Indications:       Carotid artery disease and Right stent. Risk Factors:      Hypertension,  hyperlipidemia, Diabetes, coronary artery                    disease, prior CVA, PAD. Limitations        Today's exam was limited due to heavy calcification and the                    resulting shadowing and the patient's inability or                    unwillingness to cooperate. Comparison Study:  05/24/22 - Patent right ICA stent, known left chronic                    occlusion.                    12/26/21 - known left ICA occlusion. Performing Technologist: Shona Simpson  Examination Guidelines: A complete evaluation includes B-mode imaging, spectral Doppler, color Doppler, and power Doppler as needed of all accessible portions of each vessel. Bilateral testing is considered an integral part of a complete examination. Limited examinations for reoccurring indications may be performed as noted.  Right Carotid Findings: +----------+--------+--------+--------+------------------+------------+           PSV cm/sEDV cm/sStenosisPlaque DescriptionComments     +----------+--------+--------+--------+------------------+------------+ CCA Prox  160     23                                             +----------+--------+--------+--------+------------------+------------+ CCA Distal119     16              heterogenous                   +----------+--------+--------+--------+------------------+------------+ ICA Prox  107     21              heterogenous      Patent stent +----------+--------+--------+--------+------------------+------------+ ICA Mid   117     21                                             +----------+--------+--------+--------+------------------+------------+ ICA Distal113     27                                             +----------+--------+--------+--------+------------------+------------+ ECA       420  Clementeen Graham, MD;  Location: [facility] OR;  Service: Vascular;  Laterality: Right; Gwynneth Aliment, M.A., CF-SLP Speech Language Pathology, Acute Rehabilitation Services Secure Chat preferred [phone] 07/02/2023, 3:23 PM  VAS US CAROTID  Result Date: 06/24/2023 Carotid Arterial Duplex Study Patient Name:  [name]  Date of Exam:   06/23/2023 Medical Rec #: [id number]       Accession #:    [id number] Date of Birth: 06-14-1946       Patient Gender: F Patient Age:   81 years Exam Location:  [facility] Procedure:      VAS US CAROTID Referring Phys: Elmer Picker --------------------------------------------------------------------------------  Indications:       Carotid artery disease and Right stent. Risk Factors:      Hypertension,  hyperlipidemia, Diabetes, coronary artery                    disease, prior CVA, PAD. Limitations        Today's exam was limited due to heavy calcification and the                    resulting shadowing and the patient's inability or                    unwillingness to cooperate. Comparison Study:  05/24/22 - Patent right ICA stent, known left chronic                    occlusion.                    12/26/21 - known left ICA occlusion. Performing Technologist: Shona Simpson  Examination Guidelines: A complete evaluation includes B-mode imaging, spectral Doppler, color Doppler, and power Doppler as needed of all accessible portions of each vessel. Bilateral testing is considered an integral part of a complete examination. Limited examinations for reoccurring indications may be performed as noted.  Right Carotid Findings: +----------+--------+--------+--------+------------------+------------+           PSV cm/sEDV cm/sStenosisPlaque DescriptionComments     +----------+--------+--------+--------+------------------+------------+ CCA Prox  160     23                                             +----------+--------+--------+--------+------------------+------------+ CCA Distal119     16              heterogenous                   +----------+--------+--------+--------+------------------+------------+ ICA Prox  107     21              heterogenous      Patent stent +----------+--------+--------+--------+------------------+------------+ ICA Mid   117     21                                             +----------+--------+--------+--------+------------------+------------+ ICA Distal113     27                                             +----------+--------+--------+--------+------------------+------------+ ECA       420  PATIENT DETAILS Name: [name] Age: 77 y.o. Sex: female Date of Birth: 01-19-1946 MRN: [id number]. Admitting Physician: Steffanie Dunn, DO WUJ:[id number], Barry Dienes, MD  Admit Date: 06/04/2023 Discharge date: 07/03/2023  Recommendations for Outpatient Follow-up:  Follow up with PCP in 1-2 weeks Please obtain CMP/CBC in one week  Admitted From:  Home  Disposition: Skilled nursing facility   Discharge Condition: good  CODE STATUS:   Code Status: Full Code   Diet recommendation:  Diet Order             Diet - low sodium heart healthy           DIET DYS 3 Room service appropriate? Yes with Assist; Fluid consistency: Nectar Thick  Diet effective now                    Brief Summary: Patient is a 77 y.o.  female with history of CVA, seizure, HTN, CAD s/p PCI May 2024, CKD 4, seizure disorder-presented to the ED on 8/21 with shortness of breath- found to have severe hypoxia-initially required BiPAP but subsequently intubated-admitted to the ICU-stabilized-extubated on 8/28-subsequently developed severe encephalopathy, found to have CVA, oropharyngeal dysphagia requiring NG tube feeds.  See below for further details.   Significant events: 8/21>> admitted, intubated 8/28>>extubated 9/02>> transferred to [facility] 9/07>> MRI significant for subacute CVA 9/16>>Cortrak tube removed 9/17>> hematochezia-GI/cardiology consult-DAPT stopped. 9/18>>brown stools 9/19>>plavix started   Significant studies: 8/21>> CXR: Multilobar right-sided PNA 8/21>> CT chest: Large airspace opacity involving right upper/lower lobes 8/30>> Echo: EF 60-65% 9/07>> MRI brain: Subacute infarct left parietal lobe 9/08>> MRI brain: Chronic occlusion left ICA 9/09>> carotid Doppler: Known chronic left ICA occlusion,<50% right carotid. 9/09>> LDL: 27 9/09>> A1c: 5.9   Significant microbiology data: 8/21>> COVID/influenza/RSV PCR: Negative 8/21>> respiratory virus panel: Negative 8/21>> tracheal  aspirate: Negative 8/21>> blood culture: Negative 9/03>> C. difficile studies: Negative   Procedures: 8/23>>Cortrak  Consults: PCCM Neuro Cardiology GI  Brief Hospital Course: Severe sepsis secondary to pneumonia (POA) Acute hypoxic respiratory failure Overall improved Completed a course of antibiotics Supportive care   Acute metabolic encephalopathy Slowly improving Supportive care Delirium precautions   AKI on CKD 4 AKI likely hemodynamically mediated Creatinine slowly improving and close to baseline Follow electrolytes periodically   Normocytic anemia Due to critical illness/CKD/significant ecchymosis/bruising No evidence of GI bleeding S/p 4 units of PRBC so far-last on 9/10 Follow CBC closely while at SNF   Lower GI bleeding Likely diverticular bleeding Occurred on 9/17 Thankfully self resolved-no hematochezia x 48 hours-in fact had brown stools on 9/18 CBC remained stable-no need for PRBC transfusion  CAD s/p PCI May 2024 Due to hematochezia-DAPT stopped 9/17  Appreciate cardiology input-once GI bleeding resolves-can do monotherapy with just Plavix Continue statin/beta-blocker.     Subacute CVA Workup as above Neuro recommending aspirin/Brilinta (as patient had CVA while on aspirin/Plavix)-however both agents held as patient developed lower GI bleeding-will resume Plavix on discharge (see above).  HTN BP creeping up Continue metoprolol Resume amlodipine/Imdur on discharge Resume hydralazine over the next several days at [facility] when able.   Seizure disorder Keppra   GERD PPI   Mood disorder/depression Overall stable On Remeron   Oropharyngeal dysphagia 2/2 acute illness/debility/deconditioning Required NG-tube feedings-NG tube was discontinued on 9/16-as oral intake significantly improved Continue dysphagia 3 diet-SLP following   Per prior note-IR PEG tube not possible due to ventral hernia-due to cardiac issues-aspirin/Brilinta-difficult for  surgical PEG.     11 mm right

## 2023-07-03 NOTE — Progress Notes (Signed)
Physical Therapy Treatment Patient Details Name: [name] MRN: [id number] DOB: Feb 26, 1946 Today's Date: 07/03/2023   History of Present Illness Pt is 77 year old presented to [facility] on  06/04/23 for acute respiratory failure due to PNA. Intubatd on admission. Extubated 8/28. Pt encephaloathic post extubation. 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    PT Comments  Pt agreeable to session with encouragement, demonstrating functional transfers with grossly CGA for safety with RW for support. Pt continues to require cues for optimal and safe hand placement throughout mobility. Pt declining ambulation this session due to c/o fatigue, however with fair tolerance for LE exercise for increased strength and UE exercise for increased endurance. Current plan remains appropriate to address deficits and maximize functional independence and decrease caregiver burden. Pt continues to benefit from skilled PT services to progress toward functional mobility goals.     If plan is discharge home, recommend the following: A lot of help with bathing/dressing/bathroom;Assist for transportation;Help with stairs or ramp for entrance;Two people to help with walking and/or transfers;Assistance with cooking/housework;Supervision due to cognitive status;Direct supervision/assist for medications management;Direct supervision/assist for financial management   Can travel by private vehicle        Equipment Recommendations  Other (comment) (defer to next venue of car)    Recommendations for Other Services        Precautions / Restrictions Precautions Precautions: Fall Restrictions Weight Bearing Restrictions: No     Mobility  Bed Mobility Overal bed mobility: Needs Assistance Bed Mobility: Supine to Sit     Supine to sit: Supervision     General bed mobility comments: supervision for safety, no physical assist needed    Transfers Overall transfer level: Needs assistance Equipment used: Rolling walker (2 wheels) Transfers: Sit to/from Stand, Bed to chair/wheelchair/BSC Sit to Stand: Contact guard assist   Step pivot transfers: Contact guard assist       General transfer comment: for safety    Ambulation/Gait               General Gait Details: pt declining gait   Stairs             Wheelchair Mobility     Tilt Bed    Modified Rankin (Stroke Patients Only) Modified Rankin (Stroke Patients Only) Pre-Morbid Rankin Score: Slight disability Modified Rankin: Moderately severe disability     Balance Overall balance assessment: Needs assistance Sitting-balance support: No upper extremity supported, Single extremity supported Sitting balance-Leahy Scale: Fair     Standing balance support: Bilateral upper extremity supported Standing balance-Leahy Scale: Poor Standing balance comment: reliant on RW                            Cognition Arousal: Alert Behavior During Therapy: Flat affect Overall Cognitive Status: Impaired/Different from baseline Area of Impairment: Awareness, Safety/judgement, Memory, Problem solving                     Memory: Decreased recall of precautions, Decreased short-term memory   Safety/Judgement: Decreased awareness of safety, Decreased awareness of deficits Awareness: Emergent Problem  Solving: Slow processing, Requires verbal cues General Comments: Requiring encouragment to participate. Memory seems to be improving.        Exercises General Exercises - Lower Extremity Long Arc Quad: AROM,  Right, Left, 20 reps, Seated (with 2 second hold at end range) Hip Flexion/Marching: AROM, Left, Right, 20 reps, Seated Other Exercises Other Exercises: chest press/alternating punches x30 seconds x2 sets    General Comments General comments (skin integrity, edema, etc.): VSS on RA      Pertinent Vitals/Pain Pain Assessment Pain Assessment: Faces Faces Pain Scale: Hurts little more Pain Location: bottom especially with pericare Pain Descriptors / Indicators: Discomfort Pain Intervention(s): Monitored during session, Limited activity within patient's tolerance    Home Living                          Prior Function            PT Goals (current goals can now be found in the care plan section) Acute Rehab PT Goals PT Goal Formulation: With patient Time For Goal Achievement: 06/29/23 Progress towards PT goals: Not progressing toward goals - comment (fatigue)    Frequency    Min 1X/week      PT Plan      Co-evaluation              AM-PAC PT "6 Clicks" Mobility   Outcome Measure  Help needed turning from your back to your side while in a flat bed without using bedrails?: A Little Help needed moving from lying on your back to sitting on the side of a flat bed without using bedrails?: A Lot Help needed moving to and from a bed to a chair (including a wheelchair)?: A Little Help needed standing up from a chair using your arms (e.g., wheelchair or bedside chair)?: A Little Help needed to walk in hospital room?: Total (<10') Help needed climbing 3-5 steps with a railing? : Total 6 Click Score: 13    End of Session   Activity Tolerance: Patient limited by fatigue Patient left: with call bell/phone within reach;in chair;with chair alarm set Nurse Communication: Mobility status PT Visit Diagnosis: Unsteadiness on feet (R26.81);Other abnormalities of gait and mobility (R26.89);Muscle weakness (generalized) (M62.81)     Time: 4098-1191 PT Time Calculation  (min) (ACUTE ONLY): 12 min  Charges:    $Therapeutic Activity: 8-22 mins PT General Charges $$ ACUTE PT VISIT: 1 Visit                     Reynalda Canny R. PTA Acute Rehabilitation Services Office: [phone]   Catalina Antigua 07/03/2023, 12:54 PM

## 2023-07-03 NOTE — TOC Transition Note (Addendum)
Transition of Care [facility]) - CM/SW Discharge Note   Patient Details  Name: [name] MRN: [id number] Date of Birth: [birth year]  Transition of Care [facility]) CM/SW Contact:  Mearl Latin, LCSW Phone Number: 07/03/2023, 9:31 AM   Clinical Narrative:    9:31am-CSW spoke with patient's son to inquire about family's ability to transport patient as she is doing too well for ambulance. He will ask and call CSW back.   9:47 AM-CSW received call from Alvino Chapel with [facility] stating family is paying for [facility] to come pick patient up in about 30 minutes. CSW made RN aware.    Final next level of care: Skilled Nursing Facility Barriers to Discharge: Barriers Resolved   Patient Goals and CMS Choice   Choice offered to / list presented to : Patient  Discharge Placement                         Discharge Plan and Services Additional resources added to the After Visit Summary for   In-house Referral: Clinical Social Work   Post Acute Care Choice: Skilled Nursing Facility                               Social Determinants of Health (SDOH) Interventions SDOH Screenings   Food Insecurity: No Food Insecurity (07/01/2023)  Housing: Patient Unable To Answer (07/01/2023)  Transportation Needs: No Transportation Needs (07/01/2023)  Utilities: Not At Risk (07/01/2023)  Tobacco Use: Medium Risk (06/04/2023)     Readmission Risk Interventions    07/01/2023   12:56 PM 06/27/2023    8:42 AM 06/04/2023    3:49 PM  Readmission Risk Prevention Plan  Transportation Screening Complete Complete Complete  Medication Review (RN Care Manager)   Referral to Pharmacy  PCP or Specialist appointment within 3-5 days of discharge   Complete  HRI or Home Care Consult   Complete  SW Recovery Care/Counseling Consult   Complete  Palliative Care Screening   Not Applicable  Skilled Nursing Facility   Not Applicable

## 2023-07-03 NOTE — Progress Notes (Signed)
Transportation has arrived to p/u pt.

## 2023-07-03 NOTE — Progress Notes (Signed)
Transportation left and said they would be back in 30 minutes.

## 2023-07-04 DIAGNOSIS — G9341 Metabolic encephalopathy: Secondary | ICD-10-CM | POA: Diagnosis not present

## 2023-07-04 DIAGNOSIS — N184 Chronic kidney disease, stage 4 (severe): Secondary | ICD-10-CM | POA: Diagnosis not present

## 2023-07-04 DIAGNOSIS — M6281 Muscle weakness (generalized): Secondary | ICD-10-CM | POA: Diagnosis not present

## 2023-07-04 DIAGNOSIS — R278 Other lack of coordination: Secondary | ICD-10-CM | POA: Diagnosis not present

## 2023-07-04 DIAGNOSIS — J9601 Acute respiratory failure with hypoxia: Secondary | ICD-10-CM | POA: Diagnosis not present

## 2023-07-04 DIAGNOSIS — I5042 Chronic combined systolic (congestive) and diastolic (congestive) heart failure: Secondary | ICD-10-CM | POA: Diagnosis not present

## 2023-07-04 DIAGNOSIS — K922 Gastrointestinal hemorrhage, unspecified: Secondary | ICD-10-CM | POA: Diagnosis not present

## 2023-07-04 DIAGNOSIS — D649 Anemia, unspecified: Secondary | ICD-10-CM | POA: Diagnosis not present

## 2023-07-04 DIAGNOSIS — I6330 Cerebral infarction due to thrombosis of unspecified cerebral artery: Secondary | ICD-10-CM | POA: Diagnosis not present

## 2023-07-04 DIAGNOSIS — I2510 Atherosclerotic heart disease of native coronary artery without angina pectoris: Secondary | ICD-10-CM | POA: Diagnosis not present

## 2023-07-04 DIAGNOSIS — I639 Cerebral infarction, unspecified: Secondary | ICD-10-CM | POA: Diagnosis not present

## 2023-07-04 DIAGNOSIS — J159 Unspecified bacterial pneumonia: Secondary | ICD-10-CM | POA: Diagnosis not present

## 2023-07-04 DIAGNOSIS — R569 Unspecified convulsions: Secondary | ICD-10-CM | POA: Diagnosis not present

## 2023-07-07 DIAGNOSIS — I6330 Cerebral infarction due to thrombosis of unspecified cerebral artery: Secondary | ICD-10-CM | POA: Diagnosis not present

## 2023-07-07 DIAGNOSIS — G9341 Metabolic encephalopathy: Secondary | ICD-10-CM | POA: Diagnosis not present

## 2023-07-07 DIAGNOSIS — I2510 Atherosclerotic heart disease of native coronary artery without angina pectoris: Secondary | ICD-10-CM | POA: Diagnosis not present

## 2023-07-07 DIAGNOSIS — M6281 Muscle weakness (generalized): Secondary | ICD-10-CM | POA: Diagnosis not present

## 2023-07-07 DIAGNOSIS — K922 Gastrointestinal hemorrhage, unspecified: Secondary | ICD-10-CM | POA: Diagnosis not present

## 2023-07-07 DIAGNOSIS — N184 Chronic kidney disease, stage 4 (severe): Secondary | ICD-10-CM | POA: Diagnosis not present

## 2023-07-07 DIAGNOSIS — D649 Anemia, unspecified: Secondary | ICD-10-CM | POA: Diagnosis not present

## 2023-07-07 DIAGNOSIS — J159 Unspecified bacterial pneumonia: Secondary | ICD-10-CM | POA: Diagnosis not present

## 2023-07-07 DIAGNOSIS — J9601 Acute respiratory failure with hypoxia: Secondary | ICD-10-CM | POA: Diagnosis not present

## 2023-07-08 DIAGNOSIS — J9601 Acute respiratory failure with hypoxia: Secondary | ICD-10-CM | POA: Diagnosis not present

## 2023-07-08 DIAGNOSIS — D649 Anemia, unspecified: Secondary | ICD-10-CM | POA: Diagnosis not present

## 2023-07-08 DIAGNOSIS — I5042 Chronic combined systolic (congestive) and diastolic (congestive) heart failure: Secondary | ICD-10-CM | POA: Diagnosis not present

## 2023-07-08 DIAGNOSIS — R569 Unspecified convulsions: Secondary | ICD-10-CM | POA: Diagnosis not present

## 2023-07-08 DIAGNOSIS — K922 Gastrointestinal hemorrhage, unspecified: Secondary | ICD-10-CM | POA: Diagnosis not present

## 2023-07-08 DIAGNOSIS — R278 Other lack of coordination: Secondary | ICD-10-CM | POA: Diagnosis not present

## 2023-07-08 DIAGNOSIS — J159 Unspecified bacterial pneumonia: Secondary | ICD-10-CM | POA: Diagnosis not present

## 2023-07-08 DIAGNOSIS — M6281 Muscle weakness (generalized): Secondary | ICD-10-CM | POA: Diagnosis not present

## 2023-07-08 DIAGNOSIS — I639 Cerebral infarction, unspecified: Secondary | ICD-10-CM | POA: Diagnosis not present

## 2023-07-08 DIAGNOSIS — I6330 Cerebral infarction due to thrombosis of unspecified cerebral artery: Secondary | ICD-10-CM | POA: Diagnosis not present

## 2023-07-08 DIAGNOSIS — N184 Chronic kidney disease, stage 4 (severe): Secondary | ICD-10-CM | POA: Diagnosis not present

## 2023-07-08 DIAGNOSIS — I2510 Atherosclerotic heart disease of native coronary artery without angina pectoris: Secondary | ICD-10-CM | POA: Diagnosis not present

## 2023-07-08 DIAGNOSIS — G9341 Metabolic encephalopathy: Secondary | ICD-10-CM | POA: Diagnosis not present

## 2023-07-09 DIAGNOSIS — J159 Unspecified bacterial pneumonia: Secondary | ICD-10-CM | POA: Diagnosis not present

## 2023-07-09 DIAGNOSIS — N184 Chronic kidney disease, stage 4 (severe): Secondary | ICD-10-CM | POA: Diagnosis not present

## 2023-07-09 DIAGNOSIS — J9601 Acute respiratory failure with hypoxia: Secondary | ICD-10-CM | POA: Diagnosis not present

## 2023-07-09 DIAGNOSIS — M6281 Muscle weakness (generalized): Secondary | ICD-10-CM | POA: Diagnosis not present

## 2023-07-09 DIAGNOSIS — D649 Anemia, unspecified: Secondary | ICD-10-CM | POA: Diagnosis not present

## 2023-07-09 DIAGNOSIS — G9341 Metabolic encephalopathy: Secondary | ICD-10-CM | POA: Diagnosis not present

## 2023-07-09 DIAGNOSIS — I2510 Atherosclerotic heart disease of native coronary artery without angina pectoris: Secondary | ICD-10-CM | POA: Diagnosis not present

## 2023-07-09 DIAGNOSIS — I6330 Cerebral infarction due to thrombosis of unspecified cerebral artery: Secondary | ICD-10-CM | POA: Diagnosis not present

## 2023-07-09 DIAGNOSIS — K922 Gastrointestinal hemorrhage, unspecified: Secondary | ICD-10-CM | POA: Diagnosis not present

## 2023-07-11 DIAGNOSIS — J9601 Acute respiratory failure with hypoxia: Secondary | ICD-10-CM | POA: Diagnosis not present

## 2023-07-11 DIAGNOSIS — N184 Chronic kidney disease, stage 4 (severe): Secondary | ICD-10-CM | POA: Diagnosis not present

## 2023-07-11 DIAGNOSIS — M6281 Muscle weakness (generalized): Secondary | ICD-10-CM | POA: Diagnosis not present

## 2023-07-11 DIAGNOSIS — G9341 Metabolic encephalopathy: Secondary | ICD-10-CM | POA: Diagnosis not present

## 2023-07-11 DIAGNOSIS — I2510 Atherosclerotic heart disease of native coronary artery without angina pectoris: Secondary | ICD-10-CM | POA: Diagnosis not present

## 2023-07-11 DIAGNOSIS — J159 Unspecified bacterial pneumonia: Secondary | ICD-10-CM | POA: Diagnosis not present

## 2023-07-11 DIAGNOSIS — D649 Anemia, unspecified: Secondary | ICD-10-CM | POA: Diagnosis not present

## 2023-07-11 DIAGNOSIS — I6330 Cerebral infarction due to thrombosis of unspecified cerebral artery: Secondary | ICD-10-CM | POA: Diagnosis not present

## 2023-07-11 DIAGNOSIS — K922 Gastrointestinal hemorrhage, unspecified: Secondary | ICD-10-CM | POA: Diagnosis not present

## 2023-07-14 DIAGNOSIS — K922 Gastrointestinal hemorrhage, unspecified: Secondary | ICD-10-CM | POA: Diagnosis not present

## 2023-07-14 DIAGNOSIS — I6330 Cerebral infarction due to thrombosis of unspecified cerebral artery: Secondary | ICD-10-CM | POA: Diagnosis not present

## 2023-07-14 DIAGNOSIS — J159 Unspecified bacterial pneumonia: Secondary | ICD-10-CM | POA: Diagnosis not present

## 2023-07-14 DIAGNOSIS — I2510 Atherosclerotic heart disease of native coronary artery without angina pectoris: Secondary | ICD-10-CM | POA: Diagnosis not present

## 2023-07-14 DIAGNOSIS — J9601 Acute respiratory failure with hypoxia: Secondary | ICD-10-CM | POA: Diagnosis not present

## 2023-07-14 DIAGNOSIS — M6281 Muscle weakness (generalized): Secondary | ICD-10-CM | POA: Diagnosis not present

## 2023-07-14 DIAGNOSIS — D649 Anemia, unspecified: Secondary | ICD-10-CM | POA: Diagnosis not present

## 2023-07-14 DIAGNOSIS — G9341 Metabolic encephalopathy: Secondary | ICD-10-CM | POA: Diagnosis not present

## 2023-07-14 DIAGNOSIS — N184 Chronic kidney disease, stage 4 (severe): Secondary | ICD-10-CM | POA: Diagnosis not present

## 2023-07-15 DIAGNOSIS — R569 Unspecified convulsions: Secondary | ICD-10-CM | POA: Diagnosis not present

## 2023-07-15 DIAGNOSIS — M6281 Muscle weakness (generalized): Secondary | ICD-10-CM | POA: Diagnosis not present

## 2023-07-15 DIAGNOSIS — R278 Other lack of coordination: Secondary | ICD-10-CM | POA: Diagnosis not present

## 2023-07-15 DIAGNOSIS — I639 Cerebral infarction, unspecified: Secondary | ICD-10-CM | POA: Diagnosis not present

## 2023-07-15 DIAGNOSIS — I5042 Chronic combined systolic (congestive) and diastolic (congestive) heart failure: Secondary | ICD-10-CM | POA: Diagnosis not present

## 2023-07-16 DIAGNOSIS — K922 Gastrointestinal hemorrhage, unspecified: Secondary | ICD-10-CM | POA: Diagnosis not present

## 2023-07-16 DIAGNOSIS — I6330 Cerebral infarction due to thrombosis of unspecified cerebral artery: Secondary | ICD-10-CM | POA: Diagnosis not present

## 2023-07-16 DIAGNOSIS — I2510 Atherosclerotic heart disease of native coronary artery without angina pectoris: Secondary | ICD-10-CM | POA: Diagnosis not present

## 2023-07-16 DIAGNOSIS — D649 Anemia, unspecified: Secondary | ICD-10-CM | POA: Diagnosis not present

## 2023-07-16 DIAGNOSIS — J159 Unspecified bacterial pneumonia: Secondary | ICD-10-CM | POA: Diagnosis not present

## 2023-07-16 DIAGNOSIS — N184 Chronic kidney disease, stage 4 (severe): Secondary | ICD-10-CM | POA: Diagnosis not present

## 2023-07-16 DIAGNOSIS — M6281 Muscle weakness (generalized): Secondary | ICD-10-CM | POA: Diagnosis not present

## 2023-07-16 DIAGNOSIS — G9341 Metabolic encephalopathy: Secondary | ICD-10-CM | POA: Diagnosis not present

## 2023-07-16 DIAGNOSIS — J9601 Acute respiratory failure with hypoxia: Secondary | ICD-10-CM | POA: Diagnosis not present

## 2023-07-16 NOTE — Progress Notes (Deleted)
Cardiology Clinic Note   Patient Name: TEYAH ROSSY Date of Encounter: 07/16/2023  Primary Care Provider:  Garlan Fillers, MD Primary Cardiologist:  Peter Swaziland, MD  Patient Profile    Cecelia Byars 77 year old female presents the clinic today for follow-up evaluation of her coronary artery disease.   Past Medical History    Past Medical History:  Diagnosis Date   Anxiety    Arthritis    back    Blind loop syndrome    Clotting disorder (HCC)    Hx Clot in leg per pt    Colostomy present [facility])    Coronary artery disease    a. known 60-70% mid-LAD stenosis with caths in 1999, 2002, 2006, and 2012 showing stable anatomy b. 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HCC)    Diabetes mellitus    Diverticulosis    Dizziness    chronic   Dyslipidemia    Fatty liver    GERD (gastroesophageal reflux disease)    hiatial hernia   Heart murmur    Hiatal hernia    Hypertension    Irritable bowel syndrome    Pancreatitis, chronic (HCC)    Peripheral vascular disease (HCC) 02/20/2010   s/p stent of left SFA; carotid stenosis   Seizure (HCC) 11/04/2018   last 1 yr 2020 per pt unsure month    Sleep apnea    no cpap    Stroke (HCC)    Thyroid nodule    Vitamin B12 deficiency    Past Surgical History:  Procedure Laterality Date   ABDOMINAL HYSTERECTOMY     partial   AUGMENTATION MAMMAPLASTY     bilateral 1976 retro    BREAST ENHANCEMENT SURGERY     CARDIAC CATHETERIZATION  07/07/98;08/31/01;03/04/05   60 -70% LAD   CATARACT EXTRACTION Bilateral    COLON RESECTION N/A 10/14/2017   Procedure: EXPLORATORY LAPAROTOMY, EXTENDED RIGHT COLECTOMY; APPLICATION OF ABDOMINAL VACUUM DRESSING;  Surgeon: Griselda Miner, MD;  Location: [facility] ORS;  Service: General;  Laterality: N/A;   COLONOSCOPY     CORONARY ATHERECTOMY N/A 02/12/2023   Procedure: CORONARY ATHERECTOMY;  Surgeon: Swaziland, Peter M, MD;  Location: [facility] INVASIVE CV LAB;  Service: Cardiovascular;   Laterality: N/A;   CORONARY STENT INTERVENTION N/A 02/12/2023   Procedure: CORONARY STENT INTERVENTION;  Surgeon: Swaziland, Peter M, MD;  Location: [facility] INVASIVE CV LAB;  Service: Cardiovascular;  Laterality: N/A;   CORONARY ULTRASOUND/IVUS N/A 02/12/2023   Procedure: Coronary Ultrasound/IVUS;  Surgeon: Swaziland, Peter M, MD;  Location: [facility] INVASIVE CV LAB;  Service: Cardiovascular;  Laterality: N/A;   ENDOBRONCHIAL ULTRASOUND Bilateral 02/24/2017   Procedure: ENDOBRONCHIAL ULTRASOUND;  Surgeon: Leslye Peer, MD;  Location: [facility] ENDOSCOPY;  Service: Cardiopulmonary;  Laterality: Bilateral;   FEMORAL ARTERY STENT     Left leg   LAPAROTOMY N/A 10/16/2017   Procedure: EXPLORATORY LAPAROTOMY, PARTIAL OMENTECTOMY, RESECTION ISCHEMIC ILEUM 130CM, APPLICATION OF VAC ABDOMINAL DRESSING;  Surgeon: Darnell Level, MD;  Location: [facility] ORS;  Service: General;  Laterality: N/A;   LAPAROTOMY N/A 10/18/2017   Procedure: RE-EXPLORATION OF ABDOMEN, ILEOSTOMY CREATION;  Surgeon: Almond Lint, MD;  Location: [facility] ORS;  Service: General;  Laterality: N/A;   LEFT HEART CATH AND CORONARY ANGIOGRAPHY N/A 01/29/2023   Procedure: LEFT HEART CATH AND CORONARY ANGIOGRAPHY;  Surgeon: Swaziland, Peter M, MD;  Location: [facility] INVASIVE CV LAB;  Service: Cardiovascular;  Laterality: N/A;   LUNG SURGERY  03/2017   Benign polyps removed  PATELLA REALIGNMENT Left    POLYPECTOMY     SIGMOIDOSCOPY     TONSILLECTOMY     TRANSCAROTID ARTERY REVASCULARIZATION  Right 04/18/2022   Procedure: Transcarotid Artery Revascularization;  Surgeon: Victorino Sparrow, MD;  Location: [facility] OR;  Service: Vascular;  Laterality: Right;    Allergies  Allergies  Allergen Reactions   Adhesive [Tape] Other (See Comments)    "Took off my skin"   Other Rash    "Took off my skin"   Ace Inhibitors Other (See Comments)    Unknown   Clonidine     Resulted in excessive sleepiness/somnolence   Sulfa Antibiotics Other (See Comments)    Unknown   Topiramate     unknown    Codeine Other (See Comments)    Altered mental state    Iodinated Contrast Media Rash   Latex Rash    History of Present Illness    Cecelia Byars has a PMH of carotid artery disease.  She has known occlusions of her  left ICA.  PMH also includes coronary artery disease type 2 diabetes, diabetes insipidus, dizziness, hypertension, GERD, chronic pancreatitis, CVA, and PAD stent to her left SFA.  She underwent cardiac catheterization in 1999, 2002, 2016, 2012.  She has known 60-70% stenosis of her mid LAD.  Her 2012 cardiac catheterization showed stable anatomy.  Her carotid artery disease is followed by vein vascular surgery.  Lower extremity Dopplers 2/18 showed normal flow in the right, mild occlusive disease on the left.  Her carotid Dopplers 11/26/2016 showed greater than 50% left mid common carotid artery stenosis with known left internal carotid occlusion, right internal carotid artery suggestive of 40- 59% stenosis.  She underwent nuclear stress test 3/20 which showed normal perfusion with an EF of 45-50%.  She was admitted 11/20 with AKI due to high output ileostomy and ARB.  January 2021 she was admitted with acute respiratory failure and encephalopathy related to COVID-19 pneumonia.   She was seen by Dr. Swaziland on 05/30/2020.  During that time she reported that she would get tired easily and was unable to walk very far before giving out.  She denied chest pain.  She reported back problems and also noted occasional frontal type headaches.   02/19/21 She presented the clinic  for follow-up evaluation stated she felt well.  She reported that she is staying somewhat physically active doing housework.  She reported that she is limited in her physical activity due to her chronic back pain.  She reported following a strict diet but reports she was limited in food she can eat due to her ostomy.  She reported that she would like to have her ostomy reversed and would present that day for a consultation  with a physician in [place].  Follow-up was planned for 6 months.  She was admitted to the hospital on 06/04/2023 and discharged on 07/03/2023.  Cardiology was consulted for evaluation of her antiplatelet therapy.  She had recent PCI.  She was noted to have acute GI bleed.  (Underwent cardiac catheterization 4/24 which showed critical ostial RCA stenosis with heavy calcification.  Due to her CKD she return for staged PCI and IVUS guided orbital arthrectomy with DES in the ostial RCA.  She was placed on dual antiplatelet therapy at that time.  She was hospitalized early 7/24.  She was noted to have altered mental status.  She was admitted and treated for rhabdomyolysis, AKI, UTI, and pneumonia.  On 07/01/2023 she was noted to  have hematochezia.  Her dual antiplatelet therapy was discontinued.  GI was not planning for repeat colonoscopy due to her anatomy.  Conservative management with DAPT washout and monitoring for bleeding was recommended.  She denied chest pain and noted stable chronic dyspnea on exertion.  She presents to the clinic today for follow-up evaluation and states***.  Today she denies chest pain, shortness of breath, increased lower extremity edema, fatigue, palpitations, melena, hematuria, hemoptysis, diaphoresis, weakness, presyncope, syncope, orthopnea, and PND.  Coronary artery disease-no chest pain today.  Underwent staged PCI with IVUS 5/24.  She received DES to ostial RCA.  She completed more than 4 months of dual antiplatelet therapy.  DAPT was later discontinued due to GI bleed. Continue Plavix monotherapy, atorvastatin, Imdur, metoprolol  Hyperlipidemia-LDL***. Continue statin therapy, Plavix High-fiber diet  Essential hypertension-BP today*** Maintain blood pressure log Continue current medical therapy Low-sodium diet  CVA-noted during 9/24 admission.  Neurology consulted and recommended switching Plavix to Brilinta.  DAPT was discontinued in the setting of GI bleed.   Plavix monotherapy was restarted Following with neurology  GI bleed-denies further episodes of bleeding.  Colonoscopy deferred Follows with GI  Disposition: Follow-up with Dr. Swaziland or me in 3-4 months.  Home Medications    Prior to Admission medications   Medication Sig Start Date End Date Taking? Authorizing Provider  albuterol (VENTOLIN HFA) 108 (90 Base) MCG/ACT inhaler Inhale 1-2 puffs into the lungs every 6 (six) hours as needed for wheezing or shortness of breath. 03/30/21   [provider]  amLODipine (NORVASC) 10 MG tablet Take 1 tablet (10 mg total) by mouth daily. 04/20/23   Leroy Sea, MD  atorvastatin (LIPITOR) 40 MG tablet Take 1 tablet (40 mg total) by mouth daily. 04/19/22   Tyrone Nine, MD  clopidogrel (PLAVIX) 75 MG tablet Take 1 tablet (75 mg total) by mouth daily. Patient taking differently: Take 75 mg by mouth every evening. 04/19/22   Tyrone Nine, MD  fluticasone (FLONASE) 50 MCG/ACT nasal spray Place 2 sprays into both nostrils daily as needed for allergies. 01/29/16   [provider]  furosemide (LASIX) 20 MG tablet Take 1 tablet (20 mg total) by mouth daily as needed. Patient taking differently: Take 20 mg by mouth every other day. 02/13/23   Perlie Gold, PA-C  isosorbide mononitrate (IMDUR) 60 MG 24 hr tablet TAKE 1 TABLET BY MOUTH EVERY DAY 04/28/23   Swaziland, Peter M, MD  levETIRAcetam (KEPPRA) 500 MG tablet TAKE 1 TABLET BY MOUTH TWICE A DAY Patient taking differently: Take 500 mg by mouth 2 (two) times daily. 01/20/20   Dohmeier, Porfirio Mylar, MD  meclizine (ANTIVERT) 25 MG tablet Take 0.5 tablets (12.5 mg total) by mouth 3 (three) times daily as needed for dizziness. 01/04/23   Leroy Sea, MD  metoprolol succinate (TOPROL-XL) 100 MG 24 hr tablet Take 100 mg by mouth daily. 12/20/21   [provider]  mirtazapine (REMERON) 15 MG tablet Take 15 mg by mouth at bedtime. 07/18/21   [provider]  montelukast (SINGULAIR) 10 MG  tablet Take 10 mg by mouth daily. 08/09/18   [provider]  nitroGLYCERIN (NITROSTAT) 0.4 MG SL tablet Place 1 tablet (0.4 mg total) under the tongue every 5 (five) minutes as needed for chest pain. 01/23/23   Swaziland, Peter M, MD  ondansetron (ZOFRAN) 4 MG tablet Take 1 tablet (4 mg total) by mouth every 6 (six) hours as needed for nausea or vomiting. 10/24/21   Monica Becton, Amy  S, PA-C  Pancrelipase, Lip-Prot-Amyl, (ZENPEP) 40000-126000 units CPEP Take 2 capsules with meals and one capsule with snacks. Patient taking differently: Take 2 capsules by mouth 2 (two) times daily. 06/08/21   Meryl Dare, MD  pantoprazole (PROTONIX) 40 MG tablet Take 1 tablet (40 mg total) by mouth 2 (two) times daily. 01/31/22   Meryl Dare, MD  Polysaccharide-Iron Complex 150 MG CAPS Take 1 tablet by mouth daily.    [provider]  venlafaxine XR (EFFEXOR-XR) 75 MG 24 hr capsule Take 1 capsule (75 mg total) by mouth daily. 05/18/20   [provider]  vitamin B-12 (CYANOCOBALAMIN) 500 MCG tablet Take 500 mcg by mouth daily.    [provider]  Vitamin D, Ergocalciferol, (DRISDOL) 1.25 MG (50000 UT) CAPS capsule Take 50,000 Units by mouth every Wednesday.     [provider]    Family History    Family History  Problem Relation Age of Onset   Hypertension Mother    Osteoarthritis Mother    Pulmonary embolism Mother    Hypertension Father        aneurysm   Stroke Father    Colon cancer Paternal Grandfather    Stroke Other    Breast cancer Neg Hx    Colon polyps Neg Hx    Esophageal cancer Neg Hx    Rectal cancer Neg Hx    Stomach cancer Neg Hx    Pancreatic cancer Neg Hx    Liver disease Neg Hx    She indicated that her mother is deceased. She indicated that her father is deceased. She indicated that her maternal grandmother is deceased. She indicated that her maternal grandfather is deceased. She indicated that her paternal grandmother is deceased. She  indicated that her paternal grandfather is deceased. She indicated that the status of her neg hx is unknown. She indicated that the status of her other is unknown.  Social History    Social History   Socioeconomic History   Marital status: Divorced    Spouse name: Not on file   Number of children: 2   Years of education: Not on file   Highest education level: Not on file  Occupational History   Occupation: retired    Associate Professor: DISABLED  Tobacco Use   Smoking status: Former    Current packs/day: 0.00    Average packs/day: 1 pack/day for 50.0 years (50.0 ttl pk-yrs)    Types: Cigarettes    Start date: 10/14/1960    Quit date: 10/14/2010    Years since quitting: 12.7   Smokeless tobacco: Never  Vaping Use   Vaping status: Never Used  Substance and Sexual Activity   Alcohol use: No   Drug use: No   Sexual activity: Not Currently    Comment: Hysterectomy  Other Topics Concern   Not on file  Social History Narrative   Not on file   Social Determinants of Health   Financial Resource Strain: Not on file  Food Insecurity: No Food Insecurity (07/01/2023)   Hunger Vital Sign    Worried About Running Out of Food in the Last Year: Never true    Ran Out of Food in the Last Year: Never true  Transportation Needs: No Transportation Needs (07/01/2023)   PRAPARE - Administrator, Civil Service (Medical): No    Lack of Transportation (Non-Medical): No  Physical Activity: Not on file  Stress: Not on file  Social Connections: Not on file  Intimate Partner Violence:  Not At Risk (07/01/2023)   Humiliation, Afraid, Rape, and Kick questionnaire    Fear of Current or Ex-Partner: No    Emotionally Abused: No    Physically Abused: No    Sexually Abused: No     Review of Systems    General:  No chills, fever, night sweats or weight changes.  Cardiovascular:  No chest pain, dyspnea on exertion, edema, orthopnea, palpitations, paroxysmal nocturnal dyspnea. Dermatological: No rash,  lesions/masses Respiratory: No cough, dyspnea Urologic: No hematuria, dysuria Abdominal:   No nausea, vomiting, diarrhea, bright red blood per rectum, melena, or hematemesis Neurologic:  No visual changes, wkns, changes in mental status. All other systems reviewed and are otherwise negative except as noted above.  Physical Exam    VS:  There were no vitals taken for this visit. , BMI There is no height or weight on file to calculate BMI. GEN: Well nourished, well developed, in no acute distress. HEENT: normal. Neck: Supple, no JVD, carotid bruits, or masses. Cardiac: RRR, no murmurs, rubs, or gallops. No clubbing, cyanosis, edema.  Radials/DP/PT 2+ and equal bilaterally.  Respiratory:  Respirations regular and unlabored, clear to auscultation bilaterally. GI: Soft, nontender, nondistended, BS + x 4. MS: no deformity or atrophy. Skin: warm and dry, no rash. Neuro:  Strength and sensation are intact. Psych: Normal affect.  Accessory Clinical Findings    Recent Labs: 06/04/2023: B Natriuretic Peptide 70.7 06/06/2023: ALT 17 06/16/2023: TSH 3.245 07/01/2023: Magnesium 1.7 07/02/2023: BUN 27; Creatinine, Ser 1.74; Hemoglobin 8.8; Platelets 235; Potassium 4.4; Sodium 134   Recent Lipid Panel    Component Value Date/Time   CHOL 72 06/23/2023 0557   TRIG 64 06/23/2023 0557   HDL 32 (L) 06/23/2023 0557   CHOLHDL 2.3 06/23/2023 0557   VLDL 13 06/23/2023 0557   LDLCALC 27 06/23/2023 0557    No BP recorded.  {Refresh Note OR Click here to enter BP  :1}***    ECG personally reviewed by me today- ***     Echocardiogram 06/13/2023  IMPRESSIONS     1. Left ventricular ejection fraction, by estimation, is 60 to 65%. The  left ventricle has normal function. The left ventricle has no regional  wall motion abnormalities. Left ventricular diastolic parameters are  consistent with Grade I diastolic  dysfunction (impaired relaxation).   2. Right ventricular systolic function is normal. The  right ventricular  size is normal. There is normal pulmonary artery systolic pressure. The  estimated right ventricular systolic pressure is 13.5 mmHg.   3. The mitral valve is abnormal. Trivial mitral valve regurgitation.   4. The aortic valve is tricuspid. Aortic valve regurgitation is not  visualized. Aortic valve sclerosis/calcification is present, without any  evidence of aortic stenosis. Aortic valve mean gradient measures 7.0 mmHg.   5. The inferior vena cava is normal in size with greater than 50%  respiratory variability, suggesting right atrial pressure of 3 mmHg.   Comparison(s): Changes from prior study are noted. 12/26/2021: LVEF 45-50%.   FINDINGS   Left Ventricle: Left ventricular ejection fraction, by estimation, is 60  to 65%. The left ventricle has normal function. The left ventricle has no  regional wall motion abnormalities. The left ventricular internal cavity  size was normal in size. There is   no left ventricular hypertrophy. Left ventricular diastolic parameters  are consistent with Grade I diastolic dysfunction (impaired relaxation).  Indeterminate filling pressures.   Right Ventricle: The right ventricular size is normal. No increase in  right ventricular  wall thickness. Right ventricular systolic function is  normal. There is normal pulmonary artery systolic pressure. The tricuspid  regurgitant velocity is 1.62 m/s, and   with an assumed right atrial pressure of 3 mmHg, the estimated right  ventricular systolic pressure is 13.5 mmHg.   Left Atrium: Left atrial size was normal in size.   Right Atrium: Right atrial size was normal in size.   Pericardium: There is no evidence of pericardial effusion.   Mitral Valve: The mitral valve is abnormal. There is moderate thickening  of the mitral valve leaflet(s). Trivial mitral valve regurgitation.   Tricuspid Valve: The tricuspid valve is grossly normal. Tricuspid valve  regurgitation is trivial.   Aortic  Valve: The aortic valve is tricuspid. Aortic valve regurgitation is  not visualized. Aortic valve sclerosis/calcification is present, without  any evidence of aortic stenosis. Aortic valve mean gradient measures 7.0  mmHg. Aortic valve peak gradient  measures 13.2 mmHg. Aortic valve area, by VTI measures 1.82 cm.   Pulmonic Valve: The pulmonic valve was normal in structure. Pulmonic valve  regurgitation is not visualized.   Aorta: The aortic root and ascending aorta are structurally normal, with  no evidence of dilitation.   Venous: The inferior vena cava is normal in size with greater than 50%  respiratory variability, suggesting right atrial pressure of 3 mmHg.   IAS/Shunts: No atrial level shunt detected by color flow Doppler.   LHC the 02/12/2023    Ost RCA lesion is 95% stenosed.   Prox RCA lesion is 30% stenosed.   A drug-eluting stent was successfully placed using a SYNERGY [id number] [id number].   Post intervention, there is a 0% residual stenosis.   Successful PCI of the ostial RCA with DES x 1 with IVUS guidance and orbital atherectomy.   Plan: will observe overnight. DAPT continue at least 12 months. Check renal function in am.   Diagnostic Dominance: Right  Intervention         Assessment & Plan   1.  ***   Thomasene Ripple. Darnesha Diloreto NP-C     07/16/2023, 4:29 PM [facility] [address] Office [phone] Fax [phone]    I spent***minutes examining this patient, reviewing medications, and using patient centered shared decision making involving her cardiac care.  Prior to her visit I spent greater than 20 minutes reviewing her past medical history,  medications, and prior cardiac tests.

## 2023-07-17 DIAGNOSIS — I5022 Chronic systolic (congestive) heart failure: Secondary | ICD-10-CM | POA: Diagnosis not present

## 2023-07-17 DIAGNOSIS — I6330 Cerebral infarction due to thrombosis of unspecified cerebral artery: Secondary | ICD-10-CM | POA: Diagnosis not present

## 2023-07-17 DIAGNOSIS — J9601 Acute respiratory failure with hypoxia: Secondary | ICD-10-CM | POA: Diagnosis not present

## 2023-07-17 DIAGNOSIS — N184 Chronic kidney disease, stage 4 (severe): Secondary | ICD-10-CM | POA: Diagnosis not present

## 2023-07-17 DIAGNOSIS — I2510 Atherosclerotic heart disease of native coronary artery without angina pectoris: Secondary | ICD-10-CM | POA: Diagnosis not present

## 2023-07-17 DIAGNOSIS — M6281 Muscle weakness (generalized): Secondary | ICD-10-CM | POA: Diagnosis not present

## 2023-07-17 DIAGNOSIS — D649 Anemia, unspecified: Secondary | ICD-10-CM | POA: Diagnosis not present

## 2023-07-17 DIAGNOSIS — J159 Unspecified bacterial pneumonia: Secondary | ICD-10-CM | POA: Diagnosis not present

## 2023-07-17 DIAGNOSIS — K922 Gastrointestinal hemorrhage, unspecified: Secondary | ICD-10-CM | POA: Diagnosis not present

## 2023-07-18 ENCOUNTER — Ambulatory Visit: Payer: Medicare HMO | Attending: General Practice | Admitting: General Practice

## 2023-07-31 DIAGNOSIS — I129 Hypertensive chronic kidney disease with stage 1 through stage 4 chronic kidney disease, or unspecified chronic kidney disease: Secondary | ICD-10-CM | POA: Diagnosis not present

## 2023-07-31 DIAGNOSIS — E1151 Type 2 diabetes mellitus with diabetic peripheral angiopathy without gangrene: Secondary | ICD-10-CM | POA: Diagnosis not present

## 2023-07-31 DIAGNOSIS — G9341 Metabolic encephalopathy: Secondary | ICD-10-CM | POA: Diagnosis not present

## 2023-07-31 DIAGNOSIS — A419 Sepsis, unspecified organism: Secondary | ICD-10-CM | POA: Diagnosis not present

## 2023-07-31 DIAGNOSIS — I639 Cerebral infarction, unspecified: Secondary | ICD-10-CM | POA: Diagnosis not present

## 2023-07-31 DIAGNOSIS — D509 Iron deficiency anemia, unspecified: Secondary | ICD-10-CM | POA: Diagnosis not present

## 2023-07-31 DIAGNOSIS — J9601 Acute respiratory failure with hypoxia: Secondary | ICD-10-CM | POA: Diagnosis not present

## 2023-07-31 DIAGNOSIS — Z23 Encounter for immunization: Secondary | ICD-10-CM | POA: Diagnosis not present

## 2023-07-31 DIAGNOSIS — R6520 Severe sepsis without septic shock: Secondary | ICD-10-CM | POA: Diagnosis not present

## 2023-07-31 DIAGNOSIS — N179 Acute kidney failure, unspecified: Secondary | ICD-10-CM | POA: Diagnosis not present

## 2023-07-31 DIAGNOSIS — J189 Pneumonia, unspecified organism: Secondary | ICD-10-CM | POA: Diagnosis not present

## 2023-07-31 DIAGNOSIS — R1312 Dysphagia, oropharyngeal phase: Secondary | ICD-10-CM | POA: Diagnosis not present

## 2023-08-02 DIAGNOSIS — J9601 Acute respiratory failure with hypoxia: Secondary | ICD-10-CM | POA: Diagnosis not present

## 2023-08-02 DIAGNOSIS — I69391 Dysphagia following cerebral infarction: Secondary | ICD-10-CM | POA: Diagnosis not present

## 2023-08-02 DIAGNOSIS — I5042 Chronic combined systolic (congestive) and diastolic (congestive) heart failure: Secondary | ICD-10-CM | POA: Diagnosis not present

## 2023-08-02 DIAGNOSIS — I130 Hypertensive heart and chronic kidney disease with heart failure and stage 1 through stage 4 chronic kidney disease, or unspecified chronic kidney disease: Secondary | ICD-10-CM | POA: Diagnosis not present

## 2023-08-02 DIAGNOSIS — E1122 Type 2 diabetes mellitus with diabetic chronic kidney disease: Secondary | ICD-10-CM | POA: Diagnosis not present

## 2023-08-02 DIAGNOSIS — R1312 Dysphagia, oropharyngeal phase: Secondary | ICD-10-CM | POA: Diagnosis not present

## 2023-08-02 DIAGNOSIS — J440 Chronic obstructive pulmonary disease with acute lower respiratory infection: Secondary | ICD-10-CM | POA: Diagnosis not present

## 2023-08-02 DIAGNOSIS — J189 Pneumonia, unspecified organism: Secondary | ICD-10-CM | POA: Diagnosis not present

## 2023-08-02 DIAGNOSIS — A419 Sepsis, unspecified organism: Secondary | ICD-10-CM | POA: Diagnosis not present

## 2023-08-08 DIAGNOSIS — J9601 Acute respiratory failure with hypoxia: Secondary | ICD-10-CM | POA: Diagnosis not present

## 2023-08-08 DIAGNOSIS — A419 Sepsis, unspecified organism: Secondary | ICD-10-CM | POA: Diagnosis not present

## 2023-08-08 DIAGNOSIS — J440 Chronic obstructive pulmonary disease with acute lower respiratory infection: Secondary | ICD-10-CM | POA: Diagnosis not present

## 2023-08-08 DIAGNOSIS — J189 Pneumonia, unspecified organism: Secondary | ICD-10-CM | POA: Diagnosis not present

## 2023-08-08 DIAGNOSIS — E1122 Type 2 diabetes mellitus with diabetic chronic kidney disease: Secondary | ICD-10-CM | POA: Diagnosis not present

## 2023-08-08 DIAGNOSIS — I69391 Dysphagia following cerebral infarction: Secondary | ICD-10-CM | POA: Diagnosis not present

## 2023-08-08 DIAGNOSIS — I5042 Chronic combined systolic (congestive) and diastolic (congestive) heart failure: Secondary | ICD-10-CM | POA: Diagnosis not present

## 2023-08-08 DIAGNOSIS — I130 Hypertensive heart and chronic kidney disease with heart failure and stage 1 through stage 4 chronic kidney disease, or unspecified chronic kidney disease: Secondary | ICD-10-CM | POA: Diagnosis not present

## 2023-08-08 DIAGNOSIS — R1312 Dysphagia, oropharyngeal phase: Secondary | ICD-10-CM | POA: Diagnosis not present

## 2023-08-14 DIAGNOSIS — J9601 Acute respiratory failure with hypoxia: Secondary | ICD-10-CM | POA: Diagnosis not present

## 2023-08-14 DIAGNOSIS — R1312 Dysphagia, oropharyngeal phase: Secondary | ICD-10-CM | POA: Diagnosis not present

## 2023-08-14 DIAGNOSIS — A419 Sepsis, unspecified organism: Secondary | ICD-10-CM | POA: Diagnosis not present

## 2023-08-14 DIAGNOSIS — I5042 Chronic combined systolic (congestive) and diastolic (congestive) heart failure: Secondary | ICD-10-CM | POA: Diagnosis not present

## 2023-08-14 DIAGNOSIS — E1122 Type 2 diabetes mellitus with diabetic chronic kidney disease: Secondary | ICD-10-CM | POA: Diagnosis not present

## 2023-08-14 DIAGNOSIS — I130 Hypertensive heart and chronic kidney disease with heart failure and stage 1 through stage 4 chronic kidney disease, or unspecified chronic kidney disease: Secondary | ICD-10-CM | POA: Diagnosis not present

## 2023-08-14 DIAGNOSIS — I69391 Dysphagia following cerebral infarction: Secondary | ICD-10-CM | POA: Diagnosis not present

## 2023-08-14 DIAGNOSIS — J189 Pneumonia, unspecified organism: Secondary | ICD-10-CM | POA: Diagnosis not present

## 2023-08-14 DIAGNOSIS — J440 Chronic obstructive pulmonary disease with acute lower respiratory infection: Secondary | ICD-10-CM | POA: Diagnosis not present

## 2023-08-18 DIAGNOSIS — E1122 Type 2 diabetes mellitus with diabetic chronic kidney disease: Secondary | ICD-10-CM | POA: Diagnosis not present

## 2023-08-18 DIAGNOSIS — J9601 Acute respiratory failure with hypoxia: Secondary | ICD-10-CM | POA: Diagnosis not present

## 2023-08-18 DIAGNOSIS — A419 Sepsis, unspecified organism: Secondary | ICD-10-CM | POA: Diagnosis not present

## 2023-08-18 DIAGNOSIS — R1312 Dysphagia, oropharyngeal phase: Secondary | ICD-10-CM | POA: Diagnosis not present

## 2023-08-18 DIAGNOSIS — I5042 Chronic combined systolic (congestive) and diastolic (congestive) heart failure: Secondary | ICD-10-CM | POA: Diagnosis not present

## 2023-08-18 DIAGNOSIS — J189 Pneumonia, unspecified organism: Secondary | ICD-10-CM | POA: Diagnosis not present

## 2023-08-18 DIAGNOSIS — I69391 Dysphagia following cerebral infarction: Secondary | ICD-10-CM | POA: Diagnosis not present

## 2023-08-18 DIAGNOSIS — J440 Chronic obstructive pulmonary disease with acute lower respiratory infection: Secondary | ICD-10-CM | POA: Diagnosis not present

## 2023-08-18 DIAGNOSIS — I130 Hypertensive heart and chronic kidney disease with heart failure and stage 1 through stage 4 chronic kidney disease, or unspecified chronic kidney disease: Secondary | ICD-10-CM | POA: Diagnosis not present

## 2023-08-19 DIAGNOSIS — E559 Vitamin D deficiency, unspecified: Secondary | ICD-10-CM | POA: Diagnosis not present

## 2023-08-19 DIAGNOSIS — D509 Iron deficiency anemia, unspecified: Secondary | ICD-10-CM | POA: Diagnosis not present

## 2023-08-19 DIAGNOSIS — I10 Essential (primary) hypertension: Secondary | ICD-10-CM | POA: Diagnosis not present

## 2023-08-19 DIAGNOSIS — E538 Deficiency of other specified B group vitamins: Secondary | ICD-10-CM | POA: Diagnosis not present

## 2023-08-19 DIAGNOSIS — E041 Nontoxic single thyroid nodule: Secondary | ICD-10-CM | POA: Diagnosis not present

## 2023-08-19 DIAGNOSIS — E1151 Type 2 diabetes mellitus with diabetic peripheral angiopathy without gangrene: Secondary | ICD-10-CM | POA: Diagnosis not present

## 2023-08-22 DIAGNOSIS — I5042 Chronic combined systolic (congestive) and diastolic (congestive) heart failure: Secondary | ICD-10-CM | POA: Diagnosis not present

## 2023-08-22 DIAGNOSIS — J440 Chronic obstructive pulmonary disease with acute lower respiratory infection: Secondary | ICD-10-CM | POA: Diagnosis not present

## 2023-08-22 DIAGNOSIS — I130 Hypertensive heart and chronic kidney disease with heart failure and stage 1 through stage 4 chronic kidney disease, or unspecified chronic kidney disease: Secondary | ICD-10-CM | POA: Diagnosis not present

## 2023-08-22 DIAGNOSIS — J9601 Acute respiratory failure with hypoxia: Secondary | ICD-10-CM | POA: Diagnosis not present

## 2023-08-22 DIAGNOSIS — A419 Sepsis, unspecified organism: Secondary | ICD-10-CM | POA: Diagnosis not present

## 2023-08-22 DIAGNOSIS — I69391 Dysphagia following cerebral infarction: Secondary | ICD-10-CM | POA: Diagnosis not present

## 2023-08-22 DIAGNOSIS — J189 Pneumonia, unspecified organism: Secondary | ICD-10-CM | POA: Diagnosis not present

## 2023-08-22 DIAGNOSIS — E1122 Type 2 diabetes mellitus with diabetic chronic kidney disease: Secondary | ICD-10-CM | POA: Diagnosis not present

## 2023-08-22 DIAGNOSIS — R1312 Dysphagia, oropharyngeal phase: Secondary | ICD-10-CM | POA: Diagnosis not present

## 2023-08-28 DIAGNOSIS — R1312 Dysphagia, oropharyngeal phase: Secondary | ICD-10-CM | POA: Diagnosis not present

## 2023-08-28 DIAGNOSIS — A419 Sepsis, unspecified organism: Secondary | ICD-10-CM | POA: Diagnosis not present

## 2023-08-28 DIAGNOSIS — I69391 Dysphagia following cerebral infarction: Secondary | ICD-10-CM | POA: Diagnosis not present

## 2023-08-28 DIAGNOSIS — E1122 Type 2 diabetes mellitus with diabetic chronic kidney disease: Secondary | ICD-10-CM | POA: Diagnosis not present

## 2023-08-28 DIAGNOSIS — I5042 Chronic combined systolic (congestive) and diastolic (congestive) heart failure: Secondary | ICD-10-CM | POA: Diagnosis not present

## 2023-08-28 DIAGNOSIS — J440 Chronic obstructive pulmonary disease with acute lower respiratory infection: Secondary | ICD-10-CM | POA: Diagnosis not present

## 2023-08-28 DIAGNOSIS — J189 Pneumonia, unspecified organism: Secondary | ICD-10-CM | POA: Diagnosis not present

## 2023-08-28 DIAGNOSIS — I130 Hypertensive heart and chronic kidney disease with heart failure and stage 1 through stage 4 chronic kidney disease, or unspecified chronic kidney disease: Secondary | ICD-10-CM | POA: Diagnosis not present

## 2023-08-28 DIAGNOSIS — J9601 Acute respiratory failure with hypoxia: Secondary | ICD-10-CM | POA: Diagnosis not present

## 2023-09-01 DIAGNOSIS — R1312 Dysphagia, oropharyngeal phase: Secondary | ICD-10-CM | POA: Diagnosis not present

## 2023-09-01 DIAGNOSIS — J9601 Acute respiratory failure with hypoxia: Secondary | ICD-10-CM | POA: Diagnosis not present

## 2023-09-01 DIAGNOSIS — J440 Chronic obstructive pulmonary disease with acute lower respiratory infection: Secondary | ICD-10-CM | POA: Diagnosis not present

## 2023-09-01 DIAGNOSIS — I69391 Dysphagia following cerebral infarction: Secondary | ICD-10-CM | POA: Diagnosis not present

## 2023-09-01 DIAGNOSIS — I130 Hypertensive heart and chronic kidney disease with heart failure and stage 1 through stage 4 chronic kidney disease, or unspecified chronic kidney disease: Secondary | ICD-10-CM | POA: Diagnosis not present

## 2023-09-01 DIAGNOSIS — I5042 Chronic combined systolic (congestive) and diastolic (congestive) heart failure: Secondary | ICD-10-CM | POA: Diagnosis not present

## 2023-09-01 DIAGNOSIS — J189 Pneumonia, unspecified organism: Secondary | ICD-10-CM | POA: Diagnosis not present

## 2023-09-01 DIAGNOSIS — A419 Sepsis, unspecified organism: Secondary | ICD-10-CM | POA: Diagnosis not present

## 2023-09-01 DIAGNOSIS — E1122 Type 2 diabetes mellitus with diabetic chronic kidney disease: Secondary | ICD-10-CM | POA: Diagnosis not present

## 2023-09-02 DIAGNOSIS — D509 Iron deficiency anemia, unspecified: Secondary | ICD-10-CM | POA: Diagnosis not present

## 2023-09-02 DIAGNOSIS — G4700 Insomnia, unspecified: Secondary | ICD-10-CM | POA: Diagnosis not present

## 2023-09-02 DIAGNOSIS — E46 Unspecified protein-calorie malnutrition: Secondary | ICD-10-CM | POA: Diagnosis not present

## 2023-09-02 DIAGNOSIS — E1151 Type 2 diabetes mellitus with diabetic peripheral angiopathy without gangrene: Secondary | ICD-10-CM | POA: Diagnosis not present

## 2023-09-02 DIAGNOSIS — Z0000 Encounter for general adult medical examination without abnormal findings: Secondary | ICD-10-CM | POA: Diagnosis not present

## 2023-09-02 DIAGNOSIS — I10 Essential (primary) hypertension: Secondary | ICD-10-CM | POA: Diagnosis not present

## 2023-09-02 DIAGNOSIS — I2510 Atherosclerotic heart disease of native coronary artery without angina pectoris: Secondary | ICD-10-CM | POA: Diagnosis not present

## 2023-09-02 DIAGNOSIS — I739 Peripheral vascular disease, unspecified: Secondary | ICD-10-CM | POA: Diagnosis not present

## 2023-09-02 DIAGNOSIS — K8681 Exocrine pancreatic insufficiency: Secondary | ICD-10-CM | POA: Diagnosis not present

## 2023-09-16 DIAGNOSIS — I69391 Dysphagia following cerebral infarction: Secondary | ICD-10-CM | POA: Diagnosis not present

## 2023-09-16 DIAGNOSIS — J189 Pneumonia, unspecified organism: Secondary | ICD-10-CM | POA: Diagnosis not present

## 2023-09-16 DIAGNOSIS — A419 Sepsis, unspecified organism: Secondary | ICD-10-CM | POA: Diagnosis not present

## 2023-09-16 DIAGNOSIS — R1312 Dysphagia, oropharyngeal phase: Secondary | ICD-10-CM | POA: Diagnosis not present

## 2023-09-16 DIAGNOSIS — I130 Hypertensive heart and chronic kidney disease with heart failure and stage 1 through stage 4 chronic kidney disease, or unspecified chronic kidney disease: Secondary | ICD-10-CM | POA: Diagnosis not present

## 2023-09-16 DIAGNOSIS — E1122 Type 2 diabetes mellitus with diabetic chronic kidney disease: Secondary | ICD-10-CM | POA: Diagnosis not present

## 2023-09-16 DIAGNOSIS — J440 Chronic obstructive pulmonary disease with acute lower respiratory infection: Secondary | ICD-10-CM | POA: Diagnosis not present

## 2023-09-16 DIAGNOSIS — I5042 Chronic combined systolic (congestive) and diastolic (congestive) heart failure: Secondary | ICD-10-CM | POA: Diagnosis not present

## 2023-09-16 DIAGNOSIS — J9601 Acute respiratory failure with hypoxia: Secondary | ICD-10-CM | POA: Diagnosis not present

## 2023-09-29 DIAGNOSIS — G4700 Insomnia, unspecified: Secondary | ICD-10-CM | POA: Diagnosis not present

## 2023-09-29 DIAGNOSIS — I69391 Dysphagia following cerebral infarction: Secondary | ICD-10-CM | POA: Diagnosis not present

## 2023-09-29 DIAGNOSIS — E1122 Type 2 diabetes mellitus with diabetic chronic kidney disease: Secondary | ICD-10-CM | POA: Diagnosis not present

## 2023-09-29 DIAGNOSIS — I10 Essential (primary) hypertension: Secondary | ICD-10-CM | POA: Diagnosis not present

## 2023-09-29 DIAGNOSIS — J189 Pneumonia, unspecified organism: Secondary | ICD-10-CM | POA: Diagnosis not present

## 2023-09-29 DIAGNOSIS — R1312 Dysphagia, oropharyngeal phase: Secondary | ICD-10-CM | POA: Diagnosis not present

## 2023-09-29 DIAGNOSIS — I5042 Chronic combined systolic (congestive) and diastolic (congestive) heart failure: Secondary | ICD-10-CM | POA: Diagnosis not present

## 2023-09-29 DIAGNOSIS — I739 Peripheral vascular disease, unspecified: Secondary | ICD-10-CM | POA: Diagnosis not present

## 2023-09-29 DIAGNOSIS — A419 Sepsis, unspecified organism: Secondary | ICD-10-CM | POA: Diagnosis not present

## 2023-09-29 DIAGNOSIS — I130 Hypertensive heart and chronic kidney disease with heart failure and stage 1 through stage 4 chronic kidney disease, or unspecified chronic kidney disease: Secondary | ICD-10-CM | POA: Diagnosis not present

## 2023-09-29 DIAGNOSIS — M25562 Pain in left knee: Secondary | ICD-10-CM | POA: Diagnosis not present

## 2023-09-29 DIAGNOSIS — E46 Unspecified protein-calorie malnutrition: Secondary | ICD-10-CM | POA: Diagnosis not present

## 2023-09-29 DIAGNOSIS — E1151 Type 2 diabetes mellitus with diabetic peripheral angiopathy without gangrene: Secondary | ICD-10-CM | POA: Diagnosis not present

## 2023-09-29 DIAGNOSIS — I2510 Atherosclerotic heart disease of native coronary artery without angina pectoris: Secondary | ICD-10-CM | POA: Diagnosis not present

## 2023-09-29 DIAGNOSIS — D509 Iron deficiency anemia, unspecified: Secondary | ICD-10-CM | POA: Diagnosis not present

## 2023-09-29 DIAGNOSIS — J440 Chronic obstructive pulmonary disease with acute lower respiratory infection: Secondary | ICD-10-CM | POA: Diagnosis not present

## 2023-09-29 DIAGNOSIS — J9601 Acute respiratory failure with hypoxia: Secondary | ICD-10-CM | POA: Diagnosis not present

## 2023-09-29 DIAGNOSIS — M25561 Pain in right knee: Secondary | ICD-10-CM | POA: Diagnosis not present

## 2023-10-01 DIAGNOSIS — M1711 Unilateral primary osteoarthritis, right knee: Secondary | ICD-10-CM | POA: Diagnosis not present

## 2023-10-01 DIAGNOSIS — M170 Bilateral primary osteoarthritis of knee: Secondary | ICD-10-CM | POA: Diagnosis not present

## 2023-10-01 DIAGNOSIS — M1712 Unilateral primary osteoarthritis, left knee: Secondary | ICD-10-CM | POA: Diagnosis not present

## 2023-10-02 ENCOUNTER — Telehealth: Payer: Self-pay | Admitting: Physician Assistant

## 2023-10-02 NOTE — Telephone Encounter (Signed)
Pt reports she was in the hospital and has been out about a week. Pt not quite sure what she had done while she was in the hospital but remembers "a lot of blood on her backside." Pt now states she is having a lot of pain when she has a BM. Pt scheduled to see Boone Master PA tomorrow at 9:30am. She will call back if this appt does not work.

## 2023-10-02 NOTE — Telephone Encounter (Signed)
Patient has confirmed appointment for tomorrow at 9:30 am

## 2023-10-02 NOTE — Telephone Encounter (Signed)
Inbound call from patient stating she is experiencing severe rectal pain. Patient is requesting for a call back to discuss further. Please advise, thank you.

## 2023-10-03 ENCOUNTER — Telehealth: Payer: Self-pay

## 2023-10-03 ENCOUNTER — Ambulatory Visit: Payer: Medicare HMO | Admitting: Gastroenterology

## 2023-10-03 ENCOUNTER — Other Ambulatory Visit (INDEPENDENT_AMBULATORY_CARE_PROVIDER_SITE_OTHER): Payer: Medicare HMO

## 2023-10-03 ENCOUNTER — Other Ambulatory Visit: Payer: Self-pay

## 2023-10-03 ENCOUNTER — Encounter: Payer: Self-pay | Admitting: Gastroenterology

## 2023-10-03 VITALS — BP 118/70 | HR 85 | Ht 63.0 in | Wt 162.1 lb

## 2023-10-03 DIAGNOSIS — K625 Hemorrhage of anus and rectum: Secondary | ICD-10-CM

## 2023-10-03 DIAGNOSIS — Z8673 Personal history of transient ischemic attack (TIA), and cerebral infarction without residual deficits: Secondary | ICD-10-CM

## 2023-10-03 DIAGNOSIS — K6289 Other specified diseases of anus and rectum: Secondary | ICD-10-CM

## 2023-10-03 DIAGNOSIS — R159 Full incontinence of feces: Secondary | ICD-10-CM

## 2023-10-03 DIAGNOSIS — K602 Anal fissure, unspecified: Secondary | ICD-10-CM

## 2023-10-03 DIAGNOSIS — R197 Diarrhea, unspecified: Secondary | ICD-10-CM

## 2023-10-03 DIAGNOSIS — N184 Chronic kidney disease, stage 4 (severe): Secondary | ICD-10-CM

## 2023-10-03 DIAGNOSIS — D649 Anemia, unspecified: Secondary | ICD-10-CM

## 2023-10-03 DIAGNOSIS — Z9049 Acquired absence of other specified parts of digestive tract: Secondary | ICD-10-CM

## 2023-10-03 DIAGNOSIS — I639 Cerebral infarction, unspecified: Secondary | ICD-10-CM

## 2023-10-03 DIAGNOSIS — I5023 Acute on chronic systolic (congestive) heart failure: Secondary | ICD-10-CM | POA: Diagnosis not present

## 2023-10-03 DIAGNOSIS — I25810 Atherosclerosis of coronary artery bypass graft(s) without angina pectoris: Secondary | ICD-10-CM

## 2023-10-03 DIAGNOSIS — K529 Noninfective gastroenteritis and colitis, unspecified: Secondary | ICD-10-CM

## 2023-10-03 DIAGNOSIS — Z860101 Personal history of adenomatous and serrated colon polyps: Secondary | ICD-10-CM

## 2023-10-03 LAB — COMPREHENSIVE METABOLIC PANEL
ALT: 10 U/L (ref 0–35)
AST: 14 U/L (ref 0–37)
Albumin: 3.7 g/dL (ref 3.5–5.2)
Alkaline Phosphatase: 94 U/L (ref 39–117)
BUN: 37 mg/dL — ABNORMAL HIGH (ref 6–23)
CO2: 22 meq/L (ref 19–32)
Calcium: 7.8 mg/dL — ABNORMAL LOW (ref 8.4–10.5)
Chloride: 105 meq/L (ref 96–112)
Creatinine, Ser: 2.1 mg/dL — ABNORMAL HIGH (ref 0.40–1.20)
GFR: 22.49 mL/min — ABNORMAL LOW (ref >60.00)
Glucose, Bld: 100 mg/dL — ABNORMAL HIGH (ref 70–99)
Potassium: 4.1 meq/L (ref 3.5–5.1)
Sodium: 139 meq/L (ref 135–145)
Total Bilirubin: 0.3 mg/dL (ref 0.2–1.2)
Total Protein: 7 g/dL (ref 6.0–8.3)

## 2023-10-03 LAB — IBC + FERRITIN
Ferritin: 32.6 ng/mL (ref 10.0–291.0)
Iron: 21 ug/dL — ABNORMAL LOW (ref 42–145)
Saturation Ratios: 5.1 % — ABNORMAL LOW (ref 20.0–50.0)
TIBC: 410.2 ug/dL (ref 250.0–450.0)
Transferrin: 293 mg/dL (ref 212.0–360.0)

## 2023-10-03 LAB — CBC WITH DIFFERENTIAL/PLATELET
Basophils Absolute: 0 10*3/uL (ref 0.0–0.1)
Basophils Relative: 0.1 % (ref 0.0–3.0)
Eosinophils Absolute: 0 10*3/uL (ref 0.0–0.7)
Eosinophils Relative: 0 % (ref 0.0–5.0)
HCT: 34.6 % — ABNORMAL LOW (ref 36.0–46.0)
Hemoglobin: 11 g/dL — ABNORMAL LOW (ref 12.0–15.0)
Lymphocytes Relative: 6 % — ABNORMAL LOW (ref 12.0–46.0)
Lymphs Abs: 1.3 10*3/uL (ref 0.7–4.0)
MCHC: 31.8 g/dL (ref 30.0–36.0)
MCV: 83.2 fL (ref 78.0–100.0)
Monocytes Absolute: 0.5 10*3/uL (ref 0.1–1.0)
Monocytes Relative: 2.2 % — ABNORMAL LOW (ref 3.0–12.0)
Neutro Abs: 20.4 10*3/uL — ABNORMAL HIGH (ref 1.4–7.7)
Neutrophils Relative %: 91.7 % — ABNORMAL HIGH (ref 43.0–77.0)
Platelets: 427 10*3/uL — ABNORMAL HIGH (ref 150.0–400.0)
RBC: 4.16 Mil/uL (ref 3.87–5.11)
RDW: 16.5 % — ABNORMAL HIGH (ref 11.5–15.5)
WBC: 22.2 10*3/uL (ref 4.0–10.5)

## 2023-10-03 MED ORDER — DILTIAZEM GEL 2 %: CUTANEOUS | 1 refills | Status: DC

## 2023-10-03 NOTE — Progress Notes (Signed)
Chief Complaint: rectal pain Primary GI MD: Dr. Russella Dar (Dr. Tomasa Rand - recent hospital consult)  HPI: 77 y.o. female with a past medical history of CVA 2023 on Plavix status post carotid stenting, PAD, congestive heart failure, type 2 diabetes, CAD multivessel recent cath 02/12/2023 status post DES to ostial RCA, COPD, CKD stage IV, 2018 severe ischemic colitis status post right hemicolectomy with ileostomy and then reversal.  And others listed below presents for  evaluation of rectal pain.  Nuclear stress test 2020 normal perfusion.  LVEF 45 to 50%  Admitted July 2023 with strokelike symptoms.  CTA and transcranial Doppler showed vascular compromise and patient was discharged on aspirin and Plavix.  Patient admitted April 2024 for chest pain and underwent heart catheterization.  Returned for PCI May 2024.  Hospitalized July 2024 for altered mental status and admitted for rhabdomyolysis, AKI, UTI, and pneumonia.  Last seen June 2024 by Quentin Mulling.  At that time she was having chronic diarrhea post right hemicolectomy managed with Lomotil and Questran.  MRI 2022 showed no chronic pancreatitis.  Previous diagnosis of EPI maintained on Zenpep.  Xifaxan is too expensive and she was given a course of Flagyl for 10 days February 2024 with improvement.  At last visit patient underwent stool studies including repeat pancreatic elastase which she has not completed. C diff was negative.  Patient was admitted 06/04/2023 shortness of breath found to have severe hypoxia ultimately becoming intubated and admitted to ICU.  Developed severe encephalopathy and found to have CVA.  Hospital admission complicated by hematochezia 9/17.  Required 4 units PRBCs while admitted.  Patient's presentation was most consistent with a lower GI bleed and due to her history of unsuccessful colonoscopy is recommended to avoid endoscopic procedures.  Her aspirin and Brilinta were held and bleeding spontaneously  stopped.  -------------------TODAY--------------------------------  She states she is here primarily for rectal pain.  She states since the hospital she has had severe rectal pain with bowel movements.  She is also had scant amount of hematochezia with bowel movements that works well.  States she is continuing to have diarrhea and has about 5 bowel movements per day.  States she is on Zenpep 2 pills with meals 1 with snacks.  She is unsure if this is helped her diarrhea.  She takes Imodium once a day.  Previously trialed colestipol but unable to tolerate powder and pill.  She is not in charge of her medications as her daughter-in-law is.  She states she has had this problem ever since her hemicolectomy.  Denies abdominal pain.  Denies nausea/vomiting.  Denies weight loss.  States she is doing better since her hospitalization but remains slightly unstable and weak but this is slowly improving over time.  Last hemoglobin 07/02/2023 was 8.8   PREVIOUS GI WORKUP   EGD 11/01/2021 that showed diffuse mild gastritis with erythema friability in the gastric fundus and body and a few scattered erosions in the cardia consistent with Sheria Lang erosions as well as a medium sized hiatal hernia. Biopsies showed mild chronic gastritis no H. pylori and parietal cell hyperplasia  01/02/2023 CT AB and pelvis without contrast unremarkable Patient follows with Dr. Peter Swaziland cardiology. 02/12/2023 patient had unstable angina, status post DES to ostial RCA. Will need to be on Plavix and aspirin for close year.  Past Medical History:  Diagnosis Date   Anxiety    Arthritis    back    Blind loop syndrome    Clotting disorder (HCC)  Hx Clot in leg per pt    Colostomy present [facility])    Coronary artery disease    a. known 60-70% mid-LAD stenosis with caths in 1999, 2002, 2006, and 2012 showing stable anatomy b. 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HCC)    Diabetes mellitus     Diverticulosis    Dizziness    chronic   Dyslipidemia    Fatty liver    GERD (gastroesophageal reflux disease)    hiatial hernia   Heart murmur    Hiatal hernia    Hypertension    Irritable bowel syndrome    Pancreatitis, chronic (HCC)    Peripheral vascular disease (HCC) 02/20/2010   s/p stent of left SFA; carotid stenosis   Seizure (HCC) 11/04/2018   last 1 yr 2020 per pt unsure month    Sleep apnea    no cpap    Stroke (HCC)    Thyroid nodule    Vitamin B12 deficiency     Past Surgical History:  Procedure Laterality Date   ABDOMINAL HYSTERECTOMY     partial   AUGMENTATION MAMMAPLASTY     bilateral 1976 retro    BREAST ENHANCEMENT SURGERY     CARDIAC CATHETERIZATION  07/07/98;08/31/01;03/04/05   60 -70% LAD   CATARACT EXTRACTION Bilateral    COLON RESECTION N/A 10/14/2017   Procedure: EXPLORATORY LAPAROTOMY, EXTENDED RIGHT COLECTOMY; APPLICATION OF ABDOMINAL VACUUM DRESSING;  Surgeon: Griselda Miner, MD;  Location: [facility] ORS;  Service: General;  Laterality: N/A;   COLONOSCOPY     CORONARY ATHERECTOMY N/A 02/12/2023   Procedure: CORONARY ATHERECTOMY;  Surgeon: Swaziland, Peter M, MD;  Location: [facility] INVASIVE CV LAB;  Service: Cardiovascular;  Laterality: N/A;   CORONARY STENT INTERVENTION N/A 02/12/2023   Procedure: CORONARY STENT INTERVENTION;  Surgeon: Swaziland, Peter M, MD;  Location: [facility] INVASIVE CV LAB;  Service: Cardiovascular;  Laterality: N/A;   CORONARY ULTRASOUND/IVUS N/A 02/12/2023   Procedure: Coronary Ultrasound/IVUS;  Surgeon: Swaziland, Peter M, MD;  Location: [facility] INVASIVE CV LAB;  Service: Cardiovascular;  Laterality: N/A;   ENDOBRONCHIAL ULTRASOUND Bilateral 02/24/2017   Procedure: ENDOBRONCHIAL ULTRASOUND;  Surgeon: Leslye Peer, MD;  Location: [facility] ENDOSCOPY;  Service: Cardiopulmonary;  Laterality: Bilateral;   FEMORAL ARTERY STENT     Left leg   LAPAROTOMY N/A 10/16/2017   Procedure: EXPLORATORY LAPAROTOMY, PARTIAL OMENTECTOMY, RESECTION ISCHEMIC ILEUM 130CM, APPLICATION  OF VAC ABDOMINAL DRESSING;  Surgeon: Darnell Level, MD;  Location: [facility];  Service: General;  Laterality: N/A;   LAPAROTOMY N/A 10/18/2017   Procedure: RE-EXPLORATION OF ABDOMEN, ILEOSTOMY CREATION;  Surgeon: Almond Lint, MD;  Location: [facility];  Service: General;  Laterality: N/A;   LEFT HEART CATH AND CORONARY ANGIOGRAPHY N/A 01/29/2023   Procedure: LEFT HEART CATH AND CORONARY ANGIOGRAPHY;  Surgeon: Swaziland, Peter M, MD;  Location: [facility] INVASIVE CV LAB;  Service: Cardiovascular;  Laterality: N/A;   LUNG SURGERY  03/2017   Benign polyps removed   PATELLA REALIGNMENT Left    POLYPECTOMY     SIGMOIDOSCOPY     TONSILLECTOMY     TRANSCAROTID ARTERY REVASCULARIZATION  Right 04/18/2022   Procedure: Transcarotid Artery Revascularization;  Surgeon: Victorino Sparrow, MD;  Location: [facility] OR;  Service: Vascular;  Laterality: Right;    Current Outpatient Medications  Medication Sig Dispense Refill   albuterol (VENTOLIN HFA) 108 (90 Base) MCG/ACT inhaler Inhale 1-2 puffs into the lungs every 6 (six) hours as needed for wheezing or shortness of breath.  amLODipine (NORVASC) 10 MG tablet Take 1 tablet (10 mg total) by mouth daily. 30 tablet 0   atorvastatin (LIPITOR) 40 MG tablet Take 1 tablet (40 mg total) by mouth daily. 30 tablet 0   clopidogrel (PLAVIX) 75 MG tablet Take 1 tablet (75 mg total) by mouth daily. (Patient taking differently: Take 75 mg by mouth every evening.) 30 tablet 0   fluticasone (FLONASE) 50 MCG/ACT nasal spray Place 2 sprays into both nostrils daily as needed for allergies.     furosemide (LASIX) 20 MG tablet Take 1 tablet (20 mg total) by mouth daily as needed. (Patient taking differently: Take 20 mg by mouth every other day.) 30 tablet 0   isosorbide mononitrate (IMDUR) 60 MG 24 hr tablet TAKE 1 TABLET BY MOUTH EVERY DAY 90 tablet 0   levETIRAcetam (KEPPRA) 500 MG tablet TAKE 1 TABLET BY MOUTH TWICE A DAY (Patient taking differently: Take 500 mg by mouth 2 (two) times daily.) 180  tablet 2   meclizine (ANTIVERT) 25 MG tablet Take 0.5 tablets (12.5 mg total) by mouth 3 (three) times daily as needed for dizziness. 30 tablet 0   metoprolol succinate (TOPROL-XL) 100 MG 24 hr tablet Take 100 mg by mouth daily.     mirtazapine (REMERON) 15 MG tablet Take 15 mg by mouth at bedtime.     montelukast (SINGULAIR) 10 MG tablet Take 10 mg by mouth daily.     nitroGLYCERIN (NITROSTAT) 0.4 MG SL tablet Place 1 tablet (0.4 mg total) under the tongue every 5 (five) minutes as needed for chest pain. 90 tablet 3   ondansetron (ZOFRAN) 4 MG tablet Take 1 tablet (4 mg total) by mouth every 6 (six) hours as needed for nausea or vomiting. 40 tablet 1   Pancrelipase, Lip-Prot-Amyl, (ZENPEP) 40000-126000 units CPEP Take 2 capsules with meals and one capsule with snacks. (Patient taking differently: Take 2 capsules by mouth 2 (two) times daily.) 240 capsule 0   pantoprazole (PROTONIX) 40 MG tablet Take 1 tablet (40 mg total) by mouth 2 (two) times daily. 180 tablet 2   Polysaccharide-Iron Complex 150 MG CAPS Take 1 tablet by mouth daily.     venlafaxine XR (EFFEXOR-XR) 75 MG 24 hr capsule Take 1 capsule (75 mg total) by mouth daily.     vitamin B-12 (CYANOCOBALAMIN) 500 MCG tablet Take 500 mcg by mouth daily.     Vitamin D, Ergocalciferol, (DRISDOL) 1.25 MG (50000 UT) CAPS capsule Take 50,000 Units by mouth every Wednesday.      No current facility-administered medications for this visit.    Allergies as of 10/03/2023 - Review Complete 10/03/2023  Allergen Reaction Noted   Adhesive [tape] Other (See Comments) 04/09/2018   Other Rash 04/09/2018   Ace inhibitors Other (See Comments) 02/21/2021   Clonidine  06/15/2023   Sulfa antibiotics Other (See Comments) 02/21/2021   Topiramate  02/21/2021   Codeine Other (See Comments) 11/08/2010   Iodinated contrast media Rash 11/08/2010   Latex Rash 01/02/2023    Family History  Problem Relation Age of Onset   Hypertension Mother    Osteoarthritis  Mother    Pulmonary embolism Mother    Hypertension Father        aneurysm   Stroke Father    Colon cancer Paternal Grandfather    Stroke Other    Breast cancer Neg Hx    Colon polyps Neg Hx    Esophageal cancer Neg Hx    Rectal cancer Neg Hx    Stomach  cancer Neg Hx    Pancreatic cancer Neg Hx    Liver disease Neg Hx     Social History   Socioeconomic History   Marital status: Divorced    Spouse name: Not on file   Number of children: 2   Years of education: Not on file   Highest education level: Not on file  Occupational History   Occupation: retired    Associate Professor: DISABLED  Tobacco Use   Smoking status: Former    Current packs/day: 0.00    Average packs/day: 1 pack/day for 50.0 years (50.0 ttl pk-yrs)    Types: Cigarettes    Start date: 10/14/1960    Quit date: 10/14/2010    Years since quitting: 12.9   Smokeless tobacco: Never  Vaping Use   Vaping status: Never Used  Substance and Sexual Activity   Alcohol use: No   Drug use: No   Sexual activity: Not Currently    Comment: Hysterectomy  Other Topics Concern   Not on file  Social History Narrative   Not on file   Social Drivers of Health   Financial Resource Strain: Not on file  Food Insecurity: No Food Insecurity (07/01/2023)   Hunger Vital Sign    Worried About Running Out of Food in the Last Year: Never true    Ran Out of Food in the Last Year: Never true  Transportation Needs: No Transportation Needs (07/01/2023)   PRAPARE - Administrator, Civil Service (Medical): No    Lack of Transportation (Non-Medical): No  Physical Activity: Not on file  Stress: Not on file  Social Connections: Not on file  Intimate Partner Violence: Not At Risk (07/01/2023)   Humiliation, Afraid, Rape, and Kick questionnaire    Fear of Current or Ex-Partner: No    Emotionally Abused: No    Physically Abused: No    Sexually Abused: No    Review of Systems:    Constitutional: No weight loss, fever, chills, weakness  or fatigue HEENT: Eyes: No change in vision               Ears, Nose, Throat:  No change in hearing or congestion Skin: No rash or itching Cardiovascular: No chest pain, chest pressure or palpitations   Respiratory: No SOB or cough Gastrointestinal: See HPI and otherwise negative Genitourinary: No dysuria or change in urinary frequency Neurological: No headache, dizziness or syncope Musculoskeletal: No new muscle or joint pain Hematologic: No bleeding or bruising Psychiatric: No history of depression or anxiety    Physical Exam:  Vital signs: BP 118/70 (BP Location: Right Arm, Patient Position: Sitting, Cuff Size: Normal)   Pulse 85   Ht 5\' 3"  (1.6 m)   Wt 162 lb 2 oz (73.5 kg)   SpO2 95%   BMI 28.72 kg/m   Constitutional: NAD, Well developed, Well nourished, alert and cooperative.  Appears healthier than her chart depicts. Head:  Normocephalic and atraumatic. Eyes:   PEERL, EOMI. No icterus. Conjunctiva pink. Respiratory: Respirations even and unlabored. Lungs clear to auscultation bilaterally.   No wheezes, crackles, or rhonchi.  Cardiovascular:  Regular rate and rhythm. No peripheral edema, cyanosis or pallor.  Gastrointestinal:  Soft, nondistended, nontender. No rebound or guarding. Normal bowel sounds. No appreciable masses or hepatomegaly. Rectal: Marchelle Folks CMA chaperone.  Patient declined DRE due to tenderness.  Two moderate sized fissures around anus.  3:00 and 6:00 location.  Nonbleeding.  No evidence of hemorrhoids. Msk:  Symmetrical without gross deformities. Without edema,  no deformity or joint abnormality.  Neurologic:  Alert and  oriented x4;  grossly normal neurologically.  Skin:   Dry and intact without significant lesions or rashes. Psychiatric: Oriented to person, place and time. Demonstrates good judgement and reason without abnormal affect or behaviors.   RELEVANT LABS AND IMAGING: CBC    Component Value Date/Time   WBC 13.2 (H) 07/02/2023 0338   RBC 2.79  (L) 07/02/2023 0338   HGB 8.8 (L) 07/02/2023 1644   HGB 12.1 02/06/2023 1345   HCT 26.7 (L) 07/02/2023 1644   HCT 37.9 02/06/2023 1345   PLT 235 07/02/2023 0338   PLT 251 02/06/2023 1345   MCV 93.2 07/02/2023 0338   MCV 94 02/06/2023 1345   MCH 29.4 07/02/2023 0338   MCHC 31.5 07/02/2023 0338   RDW 15.3 07/02/2023 0338   RDW 12.8 02/06/2023 1345   LYMPHSABS 2.3 06/14/2023 1501   MONOABS 1.1 (H) 06/14/2023 1501   EOSABS 0.3 06/14/2023 1501   BASOSABS 0.0 06/14/2023 1501    CMP     Component Value Date/Time   NA 134 (L) 07/02/2023 0338   NA 141 02/18/2023 1449   K 4.4 07/02/2023 0338   CL 105 07/02/2023 0338   CO2 21 (L) 07/02/2023 0338   GLUCOSE 107 (H) 07/02/2023 0338   BUN 27 (H) 07/02/2023 0338   BUN 16 02/18/2023 1449   CREATININE 1.74 (H) 07/02/2023 0338   CALCIUM 8.5 (L) 07/02/2023 0338   PROT 5.4 (L) 06/06/2023 0311   ALBUMIN 2.1 (L) 06/14/2023 0220   AST 25 06/06/2023 0311   ALT 17 06/06/2023 0311   ALKPHOS 75 06/06/2023 0311   BILITOT 0.7 06/06/2023 0311   GFRNONAA 30 (L) 07/02/2023 0338   GFRAA 30 (L) 10/21/2019 1904     Assessment/Plan:   77 y.o. female with a past medical history of CVA 2023 on Plavix status post carotid stenting, PAD, congestive heart failure, type 2 diabetes, CAD multivessel recent cath 02/12/2023 status post DES to ostial RCA, COPD, CKD stage IV, 2018 severe ischemic colitis status post right hemicolectomy with ileostomy and then reversal, recently hospitalized 05/2023 with severe hypoxia resulting in intubation found to have encephalopathy and CVA complicated by lower GI bleed that resolved on its own.  CAD s/p DES to ostial RCA  Diarrhea with fecal incontinence, with extensive workup in the past. Previous right hemicolectomy for ischemic colitis CT abdomen pelvis without contrast 12/2022 unremarkable Questionable history of chronic pancreatitis though no significant calcification seen on MRI 2022.  Unsure of Zenpep 2 with meals 1 with  snacks is helping her.  Currently on 1 Imodium a day.  Previously tried colestipol but unable to tolerate powder/pill.  Tried to obtain pancreatic elastase but patient never returned stool studies.  5 loose stools per day.  Due to extensive comorbidities patient is too high risk to undergo endoscopic evaluation especially with having CAD s/p PCI May 2024 and stroke August 2024. - CBC, CMP - CBC found to have critical lab value of WBC 22.9.  Pursue stool studies including C. difficile and GI pathogen panel (negative C diff 9/3) - Unsure patient has history of chronic pancreatitis so increasing Zenpep may possibly not aid her diarrhea.  She feels no difference when she started the Zenpep.  Patient weighs 160 pounds and dose is decently adequate for her.  Recommend trial of Imodium on a scheduled basis of 3 times per day (currently taking 1/day).  She will call us and let us know if there  is improvement.  If not we can increase up to 6 times per day. -- follow up 2-3 months  Rectal pain Rectal bleeding Scant rectal bleeding but significant rectal pain after bowel movements.  Rectal exam showed to moderate-sized fissures.  Likely from frequent bowel movements and vigorous wiping. - Diltiazem 2%/lidocaine 5% compound cream 3 times daily 6 to 8 weeks - Desitin after every bowel movement - Educated patient on hygiene and recommend against vigorous wiping  CAD s/p DES to ostial RCA 02/2023 CVA 05/2023 Extensive comorbidities limit our endoscopic evaluation as it would be too high risk and patient is currently unable to get off of her blood thinner at this time due to recent CAD and CVA. Follows with Dr. Swaziland  Normocytic anemia S/p 4 units PRBC with last 1 06/24/2023.  CBC improving upon discharge. - Check CBC -- CMP, iron and ferritin  CKD stage IV Following with nephrology  History of adenomatous colonic polyps Flex sig 12/2018 - Diffuse mild mucosal changes were found in the rectum and in the  sigmoid colon secondary to diversion colitis. Biopsied. - Unable to advance beyond 25 cm due to restricted mobility of the colon. - Internal hemorrhoids. Path benign Recall 3 years Unable to do at this time with recent DES 02/12/23 and CVA with ICU hospitalization requiring intubation 05/2023 Close follow up  [facility] 10/03/2023, 9:50 AM  Cc: Garlan Fillers, MD

## 2023-10-03 NOTE — Telephone Encounter (Signed)
Left message for patient to return my call.

## 2023-10-03 NOTE — Patient Instructions (Addendum)
Your provider has requested that you go to the basement level for lab work before leaving today. Press "B" on the elevator. The lab is located at the first door on the left as you exit the elevator.   Your provider has prescribed diltiazem/lidoacaine gel for you to apply for 6-8 weeks. Please follow the directions written on your prescription bottle or given to you specifically by your provider. Since this is a specialty medication and is not readily available at most local pharmacies, we have sent your prescription to:  [facility] information is below: Address: [address] [place], [place] [address]  [phone]  *Please DO NOT go directly from our office to pick up this medication! Give the pharmacy 1 day to process the prescription as this is compounded and takes time to make.   Please also use Desitin after every bowel movement.   Follow up with Quentin Mulling, PA in 3 months.   _______________________________________________________  If your blood pressure at your visit was 140/90 or greater, please contact your primary care physician to follow up on this.  _______________________________________________________  If you are age 46 or older, your body mass index should be between 23-30. Your Body mass index is 28.72 kg/m. If this is out of the aforementioned range listed, please consider follow up with your Primary Care Provider.  If you are age 57 or younger, your body mass index should be between 19-25. Your Body mass index is 28.72 kg/m. If this is out of the aformentioned range listed, please consider follow up with your Primary Care Provider.   ________________________________________________________  The [facility] providers would like to encourage you to use [facility] to communicate with providers for non-urgent requests or questions.  Due to long hold times on the telephone, sending your provider a message by [facility] may be a faster and more efficient  way to get a response.  Please allow 48 business hours for a response.  Please remember that this is for non-urgent requests.  _______________________________________________________

## 2023-10-06 NOTE — Progress Notes (Signed)
Agree with the assessment and plan as outlined by Boone Master, PA-C.  Agree that there needs to be a very high threshold for endoscopic evaluation given her numerous comorbidities and high periprocedural risks.  Her dyschezia can be attributed to the anal fissures seen on exam, will hopefully improve with intra-anal dilt. With her diarrhea and leukocytosis with recent hospitalization, C diff high on the differential and needs to be excluded first.

## 2023-10-06 NOTE — Telephone Encounter (Signed)
See result note under lab for documentation.

## 2023-10-10 ENCOUNTER — Other Ambulatory Visit: Payer: Medicare HMO

## 2023-10-10 DIAGNOSIS — A09 Infectious gastroenteritis and colitis, unspecified: Secondary | ICD-10-CM | POA: Diagnosis not present

## 2023-10-10 DIAGNOSIS — R197 Diarrhea, unspecified: Secondary | ICD-10-CM

## 2023-10-10 DIAGNOSIS — A048 Other specified bacterial intestinal infections: Secondary | ICD-10-CM | POA: Diagnosis not present

## 2023-10-12 LAB — GI PROFILE, STOOL, PCR

## 2023-10-13 ENCOUNTER — Other Ambulatory Visit: Payer: Self-pay | Admitting: *Deleted

## 2023-10-13 DIAGNOSIS — M51369 Other intervertebral disc degeneration, lumbar region without mention of lumbar back pain or lower extremity pain: Secondary | ICD-10-CM | POA: Diagnosis not present

## 2023-10-13 DIAGNOSIS — M47896 Other spondylosis, lumbar region: Secondary | ICD-10-CM | POA: Diagnosis not present

## 2023-10-13 DIAGNOSIS — D72829 Elevated white blood cell count, unspecified: Secondary | ICD-10-CM

## 2023-10-13 DIAGNOSIS — M5416 Radiculopathy, lumbar region: Secondary | ICD-10-CM | POA: Diagnosis not present

## 2023-10-13 DIAGNOSIS — M25561 Pain in right knee: Secondary | ICD-10-CM | POA: Diagnosis not present

## 2023-10-29 DIAGNOSIS — R609 Edema, unspecified: Secondary | ICD-10-CM | POA: Diagnosis not present

## 2023-10-29 DIAGNOSIS — N189 Chronic kidney disease, unspecified: Secondary | ICD-10-CM | POA: Diagnosis not present

## 2023-10-29 DIAGNOSIS — I129 Hypertensive chronic kidney disease with stage 1 through stage 4 chronic kidney disease, or unspecified chronic kidney disease: Secondary | ICD-10-CM | POA: Diagnosis not present

## 2023-10-29 DIAGNOSIS — R300 Dysuria: Secondary | ICD-10-CM | POA: Diagnosis not present

## 2023-10-29 DIAGNOSIS — N1832 Chronic kidney disease, stage 3b: Secondary | ICD-10-CM | POA: Diagnosis not present

## 2023-10-29 DIAGNOSIS — E1122 Type 2 diabetes mellitus with diabetic chronic kidney disease: Secondary | ICD-10-CM | POA: Diagnosis not present

## 2023-10-29 DIAGNOSIS — D631 Anemia in chronic kidney disease: Secondary | ICD-10-CM | POA: Diagnosis not present

## 2023-11-05 DIAGNOSIS — F424 Excoriation (skin-picking) disorder: Secondary | ICD-10-CM | POA: Diagnosis not present

## 2023-11-05 DIAGNOSIS — L308 Other specified dermatitis: Secondary | ICD-10-CM | POA: Diagnosis not present

## 2023-11-05 DIAGNOSIS — Z85828 Personal history of other malignant neoplasm of skin: Secondary | ICD-10-CM | POA: Diagnosis not present

## 2023-11-05 DIAGNOSIS — D485 Neoplasm of uncertain behavior of skin: Secondary | ICD-10-CM | POA: Diagnosis not present

## 2023-11-05 DIAGNOSIS — L570 Actinic keratosis: Secondary | ICD-10-CM | POA: Diagnosis not present

## 2023-11-05 DIAGNOSIS — L281 Prurigo nodularis: Secondary | ICD-10-CM | POA: Diagnosis not present

## 2023-12-08 DIAGNOSIS — K8681 Exocrine pancreatic insufficiency: Secondary | ICD-10-CM | POA: Diagnosis not present

## 2023-12-08 DIAGNOSIS — I739 Peripheral vascular disease, unspecified: Secondary | ICD-10-CM | POA: Diagnosis not present

## 2023-12-08 DIAGNOSIS — L299 Pruritus, unspecified: Secondary | ICD-10-CM | POA: Diagnosis not present

## 2023-12-08 DIAGNOSIS — G40909 Epilepsy, unspecified, not intractable, without status epilepticus: Secondary | ICD-10-CM | POA: Diagnosis not present

## 2023-12-08 DIAGNOSIS — E46 Unspecified protein-calorie malnutrition: Secondary | ICD-10-CM | POA: Diagnosis not present

## 2023-12-08 DIAGNOSIS — E1151 Type 2 diabetes mellitus with diabetic peripheral angiopathy without gangrene: Secondary | ICD-10-CM | POA: Diagnosis not present

## 2023-12-08 DIAGNOSIS — I2510 Atherosclerotic heart disease of native coronary artery without angina pectoris: Secondary | ICD-10-CM | POA: Diagnosis not present

## 2023-12-23 ENCOUNTER — Other Ambulatory Visit (HOSPITAL_COMMUNITY): Payer: Self-pay

## 2024-01-05 ENCOUNTER — Other Ambulatory Visit: Payer: Self-pay | Admitting: Internal Medicine

## 2024-01-05 DIAGNOSIS — Z1231 Encounter for screening mammogram for malignant neoplasm of breast: Secondary | ICD-10-CM

## 2024-02-03 DIAGNOSIS — L299 Pruritus, unspecified: Secondary | ICD-10-CM | POA: Diagnosis not present

## 2024-02-03 DIAGNOSIS — G40909 Epilepsy, unspecified, not intractable, without status epilepticus: Secondary | ICD-10-CM | POA: Diagnosis not present

## 2024-02-03 DIAGNOSIS — R413 Other amnesia: Secondary | ICD-10-CM | POA: Diagnosis not present

## 2024-02-03 DIAGNOSIS — Z8673 Personal history of transient ischemic attack (TIA), and cerebral infarction without residual deficits: Secondary | ICD-10-CM | POA: Diagnosis not present

## 2024-02-09 DIAGNOSIS — M170 Bilateral primary osteoarthritis of knee: Secondary | ICD-10-CM | POA: Diagnosis not present

## 2024-03-03 DIAGNOSIS — M545 Low back pain, unspecified: Secondary | ICD-10-CM | POA: Diagnosis not present

## 2024-03-03 DIAGNOSIS — D631 Anemia in chronic kidney disease: Secondary | ICD-10-CM | POA: Diagnosis not present

## 2024-03-03 DIAGNOSIS — M5416 Radiculopathy, lumbar region: Secondary | ICD-10-CM | POA: Diagnosis not present

## 2024-03-03 DIAGNOSIS — I129 Hypertensive chronic kidney disease with stage 1 through stage 4 chronic kidney disease, or unspecified chronic kidney disease: Secondary | ICD-10-CM | POA: Diagnosis not present

## 2024-03-03 DIAGNOSIS — M47896 Other spondylosis, lumbar region: Secondary | ICD-10-CM | POA: Diagnosis not present

## 2024-03-03 DIAGNOSIS — N1832 Chronic kidney disease, stage 3b: Secondary | ICD-10-CM | POA: Diagnosis not present

## 2024-03-03 DIAGNOSIS — M51369 Other intervertebral disc degeneration, lumbar region without mention of lumbar back pain or lower extremity pain: Secondary | ICD-10-CM | POA: Diagnosis not present

## 2024-03-03 DIAGNOSIS — M25561 Pain in right knee: Secondary | ICD-10-CM | POA: Diagnosis not present

## 2024-03-03 DIAGNOSIS — R296 Repeated falls: Secondary | ICD-10-CM | POA: Diagnosis not present

## 2024-03-03 DIAGNOSIS — R609 Edema, unspecified: Secondary | ICD-10-CM | POA: Diagnosis not present

## 2024-03-03 DIAGNOSIS — M47816 Spondylosis without myelopathy or radiculopathy, lumbar region: Secondary | ICD-10-CM | POA: Diagnosis not present

## 2024-03-03 LAB — LAB REPORT - SCANNED: PTH, Intact: 157

## 2024-03-04 DIAGNOSIS — Z79899 Other long term (current) drug therapy: Secondary | ICD-10-CM | POA: Diagnosis not present

## 2024-03-25 ENCOUNTER — Encounter: Payer: Self-pay | Admitting: Podiatry

## 2024-03-25 ENCOUNTER — Ambulatory Visit (INDEPENDENT_AMBULATORY_CARE_PROVIDER_SITE_OTHER)

## 2024-03-25 ENCOUNTER — Ambulatory Visit: Admitting: Podiatry

## 2024-03-25 DIAGNOSIS — M79672 Pain in left foot: Secondary | ICD-10-CM

## 2024-03-25 DIAGNOSIS — M25572 Pain in left ankle and joints of left foot: Secondary | ICD-10-CM

## 2024-03-25 DIAGNOSIS — M7752 Other enthesopathy of left foot: Secondary | ICD-10-CM | POA: Diagnosis not present

## 2024-03-25 DIAGNOSIS — M778 Other enthesopathies, not elsewhere classified: Secondary | ICD-10-CM | POA: Diagnosis not present

## 2024-03-25 MED ORDER — TRIAMCINOLONE ACETONIDE 10 MG/ML IJ SUSP
10.0000 mg | Freq: Once | INTRAMUSCULAR | Status: AC
Start: 2024-03-25 — End: 2024-03-25
  Administered 2024-03-25: 10 mg

## 2024-03-25 NOTE — Progress Notes (Signed)
 Patient presents complaining pain dorsal and lateral aspect left.  Is been there for several months and getting worse.  Does not recall any specific injury to the foot or ankle.  Indicates over the area of the sinus tarsi mostly.  Has not noticed any redness or ecchymosis.   Physical exam:  General appearance: Pleasant, and in no acute distress. AOx3.  Vascular: Pedal pulses: DP 2/4 bilaterally, PT 2/4 bilaterally.  Moderate edema lower legs bilaterally. Capillary fill time immediate.  Neurological: Light touch intact feet bilaterally.  Normal Achilles reflex bilaterally.  No clonus or spasticity noted.  Negative Tinel's sign with the dorsal cutaneous nerves around the foot and ankle left.  Dermatologic:   Skin normal temperature bilaterally.  Skin normal color, tone, and texture bilaterally.   Musculoskeletal: Good muscle strength at foot and ankle only.  Tenderness to palpation of the subtalar joint and with range of motion of the subtalar joint left.  Some tenderness along the anterior ankle.  No tenderness along the peroneal tendons.  Radiographs: 3 views foot left: Notes any fractures or dislocations in the midfoot or rear foot.  Joint space second tarsal metatarsal phalangeal joint left.  Slightly decreased bone density.  No evidence of any bone tumors.  Slight anterior impingement at the ankle.  Diagnosis: 1.  Pain left foot. 2.  Arthralgia subtalar joint left. 3.  Capsulitis foot left.  Plan: -New patient visit level 3 for evaluation and management. - Discussed with the pain around the subtalar joint and midfoot left.  Discussed proper shoes.  Recommended icing 20 minutes an hour several times a day.  Wear good supportive shoes even around the house. _ -injected 3cc 2:1 mixture 0.5 cc Marcaine:Kenolog 10mg /36ml at sinus tarsi/subtalar joint left.     Return 2 weeks follow-up injection STJ left

## 2024-03-31 ENCOUNTER — Encounter: Payer: Self-pay | Admitting: Physician Assistant

## 2024-04-08 ENCOUNTER — Ambulatory Visit

## 2024-04-08 ENCOUNTER — Encounter: Payer: Self-pay | Admitting: Physician Assistant

## 2024-04-08 ENCOUNTER — Encounter: Payer: Self-pay | Admitting: Podiatry

## 2024-04-08 ENCOUNTER — Ambulatory Visit (INDEPENDENT_AMBULATORY_CARE_PROVIDER_SITE_OTHER): Admitting: Physician Assistant

## 2024-04-08 ENCOUNTER — Ambulatory Visit (INDEPENDENT_AMBULATORY_CARE_PROVIDER_SITE_OTHER): Admitting: Podiatry

## 2024-04-08 ENCOUNTER — Other Ambulatory Visit

## 2024-04-08 VITALS — BP 94/55 | HR 88 | Resp 20 | Ht 63.0 in | Wt 169.0 lb

## 2024-04-08 DIAGNOSIS — R0901 Asphyxia: Secondary | ICD-10-CM

## 2024-04-08 DIAGNOSIS — M25572 Pain in left ankle and joints of left foot: Secondary | ICD-10-CM

## 2024-04-08 DIAGNOSIS — R413 Other amnesia: Secondary | ICD-10-CM

## 2024-04-08 DIAGNOSIS — R479 Unspecified speech disturbances: Secondary | ICD-10-CM

## 2024-04-08 LAB — VITAMIN B12: Vitamin B-12: 378 pg/mL (ref 200–1100)

## 2024-04-08 LAB — TSH: TSH: 3.96 m[IU]/L (ref 0.40–4.50)

## 2024-04-08 MED ORDER — TRIAMCINOLONE ACETONIDE 10 MG/ML IJ SUSP
10.0000 mg | Freq: Once | INTRAMUSCULAR | Status: AC
Start: 2024-04-08 — End: 2024-04-08
  Administered 2024-04-08: 10 mg

## 2024-04-08 NOTE — Progress Notes (Signed)
 Assessment/Plan:     [name] is a very pleasant 78 y.o. 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 MoCA today is 18/30.  Etiology of her memory loss is unclear, although there is concern for neurodegenerative disorder, and vascular.  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  We discussed the role of speech therapy, and a referral will be placed.  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  Recommended that her son monitors her compliance with the medication as she has missed some of her doses in the recent past which puts her at risk for seizures. Recommend good control of cardiovascular risk factors.  Patient informed of her low blood pressure today, follow-up with cardiology Referral to Speech Therapy for speech difficulty Continue to control mood as per PCP Folllow up in 3  months  Subjective:    The patient is accompanied by her son who supplements  the history.    How long did patient have memory difficulties?  For about 1 year, worse for the last few months, with trouble finding the right word or following a conversation.  Many times I know the words but they do not come out of my mouth -she says.  Patient reports more difficulty remembering new information,names of her grandchildren and great-grandchildren.  She also forgets important dates.  She is still able to follow directions.She reports that since being into a dated for about 2 weeks, my tongue being on the right side, now I have more trouble speaking, like my tongue is on my side . repeats oneself?  Occasionally.  Disoriented when walking into a room? Only when I had the episode of confusion I was disoriented.   Leaving objects in unusual places?  Denies.   Wandering behavior? Denies.   Any personality changes, or depression, anxiety?  She finds it more difficult to keep her house clean which is unlike her, and is more anxious and irritable than before   Hallucinations or paranoia? may have auditory hallucinations, a grand baby was calling her name, but he was not there -her son says.  No visual hallucinations Seizures?  She has a history of seizures, initially seen at [facility] in 2020.  Patient missed several appointments and was discharged from the practice.  She continues to be on Keppra  but her PCP and is not very  compliant with the medication. She admits forgetting the doses. Since Nov she had 2-3 episodes of confusion.  She had episodes in which she fell, and she thought  family members were there calling their names, including that of her baby great grandson. No aura. She does not remember if she had any tongue biting. No metallic or blood taste. No teeth fracture. Denies foam in her mouth during the episode. When she fell she hit the frontal area. I was sore all over. She is not sure if she had  body jerks,  but she reports that she urinated on herself.  She does not recall how long they lasted.   Any sleep changes? Does not sleep well. Denies frequent nightmares or dream reenactment, other REM behavior or sleepwalking   Sleep apnea? She is not using the CPAP Any hygiene concerns?  Denies.   Independent of bathing and dressing? Endorsed  Does the patient need help with medications? Patient is in charge, may be missing her doses   Who is in charge of the finances?  Patient  is in charge     Any changes in appetite?   Denies.     Patient have trouble swallowing?  Denies.   Does the patient cook? No Any headaches?  Sometimes at night but not long lasting.  Chronic pain? Denies.   Ambulates with difficulty? She ambulates with some difficulty, does not like using  a walker or cane.  Recent falls or head injuries?  8 months ago, falling and then not recognizing her sons. She had head injury and LOC during that time.  Vision changes?  Denies any new issues.    Any strokelike symptoms?  Residual speech changes since November 2024, after being intubated, she is not sure if she had speech therapy at the time.  She did not regain full speech capacity.  She denies any other areas of weakness or numbness. Any tremors? Denies.  Any anosmia? Denies.   Any incontinence of urine? Denies  Any bowel dysfunction? Denies.      Patient lives with her oldest son . History of heavy alcohol  intake? Denies.   History of heavy tobacco use? Denies.   Family history of dementia?  No Does patient drive?  Not driving at this point   Stroke workup studies from September 2024  MRI 1.8 cm focus FLAIR hyperintense involving cortical and subcortical aspect was parietal lobe -- likely reflecting evolving subacute ischemic infarct.  New from 01/02/2023.     MRA: Chronic occlusion of left ICA     Carotid Doppler right carotid ECA greater than 50% stenosed, patient stent in place.  Left carotid showed hemodynamically  significant plaque greater than 50% in common carotid artery.  Vertebral arteries WNL.     LDL 27     HgbA1c 5.9    VTE prophylaxis -aspirin  81 mg, subcu heparin , discontinue Plavix , start Brilinta  90 mg. Cardiac stent placed 02/2023 Discharge on aspirin /Brilinta  on DAPT, as aspirin /Plavix  regimen has failed.  Allergies  Allergen Reactions   Adhesive [Tape] Other (See Comments)    Took off my skin   Other Rash    Took off my skin   Ace Inhibitors Other (See Comments)    Unknown   Clonidine      Resulted in excessive sleepiness/somnolence   Sulfa Antibiotics Other (See Comments)    Unknown   Topiramate     unknown   Codeine Other (See Comments)    Altered mental state    Iodinated Contrast Media Rash   Latex Rash    Current Outpatient Medications  Medication Instructions   albuterol  (VENTOLIN  HFA) 108 (90 Base) MCG/ACT inhaler 1-2 puffs, Every 6  hours PRN   amLODipine  (NORVASC ) 10 mg, Oral, Daily   atorvastatin  (LIPITOR) 40 mg, Oral, Daily   clopidogrel  (PLAVIX ) 75 mg, Oral, Daily   cyanocobalamin  (VITAMIN B12) 500 mcg, Daily   diltiazem  2 % GEL Add 5% lidocaine / Apply to rectum three times a day for 6-8 weeks   fluticasone  (FLONASE ) 50 MCG/ACT nasal spray 2 sprays, Daily PRN   furosemide  (LASIX ) 20 mg, Oral, Daily PRN   isosorbide  mononitrate (IMDUR ) 60 mg, Oral, Daily   levETIRAcetam  (KEPPRA ) 500 MG tablet TAKE 1 TABLET BY MOUTH TWICE A DAY   meclizine  (ANTIVERT ) 12.5 mg, Oral, 3 times daily PRN   metoprolol  succinate (TOPROL -XL) 100 mg, Daily   mirtazapine  (REMERON ) 15 mg, Daily at bedtime   montelukast  (SINGULAIR ) 10 mg, Daily   nitroGLYCERIN  (NITROSTAT ) 0.4 mg, Sublingual, Every 5 min PRN   ondansetron  (ZOFRAN ) 4 mg, Oral, Every 6 hours PRN   Pancrelipase , Lip-Prot-Amyl, (ZENPEP ) 40000-126000 units CPEP Take 2 capsules with meals and one capsule with snacks.   pantoprazole  (PROTONIX ) 40 mg, Oral, 2 times daily   Polysaccharide-Iron  Complex 150 MG CAPS 1 tablet,  Daily   venlafaxine  XR (EFFEXOR -XR) 75 mg, Daily   Vitamin D  (Ergocalciferol ) (DRISDOL ) 50,000 Units, Every Wed     VITALS:   Vitals:   04/08/24 1448  BP: (!) 94/55  Pulse: 88  Resp: 20  SpO2: 98%  Weight: 169 lb (76.7 kg)  Height: 5' 3 (1.6 m)     Physical Exam  :    04/08/2024    7:00 PM  Montreal Cognitive Assessment   Visuospatial/ Executive (0/5) 2  Naming (0/3) 3  Attention: Read list of digits (0/2) 2  Attention: Read list of letters (0/1) 1  Attention: Serial 7 subtraction starting at 100 (0/3) 0  Language: Repeat phrase (0/2) 2  Language : Fluency (0/1) 0  Abstraction (0/2) 1  Delayed Recall (0/5) 3  Orientation (0/6) 3  Total 17  Adjusted Score (based on education) 18        No data to display             HEENT:  Normocephalic, atraumatic.  The superficial temporal arteries are without ropiness or tenderness. Cardiovascular: Regular rate and rhythm.  Soft 1/6 systolic murmur Lungs: Clear to auscultation bilaterally. Neck: There are no carotid bruits noted bilaterally. Orientation:  Alert and oriented to person, not to place or time.  She has mild aphasia or no frank dysarthria or difficulty with speech due to right-sided tongue deviation. Fund of knowledge is appropriate. Recent and remote memory impaired.  Attention and concentration are reduced.  Able to name objects and repeat phrases.  Delayed recall 3/5 Cranial nerves: There is good facial symmetry. Extraocular muscles are intact and visual fields are full to confrontational testing. Speech is not fluent and clear.  Right sided tongue deviation. Hearing is intact to conversational tone. Tone: Tone is good throughout. Sensation: Sensation is intact to light touch.  Vibration is intact at the bilateral big toe.  Coordination: The patient has no difficulty with RAM's or FNF bilaterally. Normal finger to nose  Motor: Strength is 5/5 in the bilateral upper and lower extremities. There is no pronator  drift. There are no fasciculations noted. DTR's: Deep tendon reflexes are1/4 bilaterally. Gait and Station: The patient is able to ambulate with some difficulty,  gait is cautious and slightly wider based.  Stride length is slow.     Thank you for allowing us  the opportunity to participate in  the care of this nice patient. Please do not hesitate to contact us  for any questions or concerns.   Total time spent on today's visit was 60 minutes dedicated to this patient today, preparing to see patient, examining the patient, ordering tests and/or medications and counseling the patient, documenting clinical information in the EHR or other health record, independently interpreting results and communicating results to the patient/family, discussing treatment and goals, answering patient's questions and coordinating care.  Cc:  Yolande Toribio MATSU, MD  Camie Sevin 04/09/2024 7:27 AM

## 2024-04-08 NOTE — Patient Instructions (Addendum)
 It was a pleasure to see you today at our office.   Recommendations:  Neurocognitive evaluation at our office   MRI of the brain, the radiology office will call you to arrange you appointment  [phone] ST referral Check labs today  [address] Follow up after neuropsych    If you have any severe symptoms of a stroke, or other severe issues such as confusion,severe chills or fever, etc call 911 or go to the ER as you may need to be evaluated further     FOR Memory  decline, memory medications: Call our office [phone]    [email or web address]   RECOMMENDATIONS FOR ALL PATIENTS WITH MEMORY PROBLEMS: 1. Continue to exercise (Recommend 30 minutes of walking everyday, or 3 hours every week) 2. Increase social interactions - continue going to [place] and enjoy social gatherings with friends and family 3. Eat healthy, avoid fried foods and eat more fruits and vegetables 4. Maintain adequate blood pressure, blood sugar, and blood cholesterol level. Reducing the risk of stroke and cardiovascular disease also helps promoting better memory. 5. Avoid stressful situations. Live a simple life and avoid aggravations. Organize your time and prepare for the next day in anticipation. 6. Sleep well, avoid any interruptions of sleep and avoid any distractions in the bedroom that may interfere with adequate sleep quality 7. Avoid sugar, avoid sweets as there is a strong link between excessive sugar intake, diabetes, and cognitive impairment We discussed the Mediterranean diet, which has been shown to help patients reduce the risk of progressive memory disorders and reduces cardiovascular risk. This includes eating fish, eat fruits and green leafy vegetables, nuts like almonds and hazelnuts, walnuts, and also use olive oil. Avoid fast foods and fried foods as much as possible. Avoid sweets and sugar as sugar use has been linked to worsening of memory  function.  There is always a concern of gradual progression of memory problems. If this is the case, then we may need to adjust level of care according to patient needs. Support, both to the patient and caregiver, should then be put into place.      You have been referred for a neuropsychological evaluation (i.e., evaluation of memory and thinking abilities). Please bring someone with you to this appointment if possible, as it is helpful for the doctor to hear from both you and another adult who knows you well. Please bring eyeglasses and hearing aids if you wear them.    The evaluation will take approximately 3 hours and has two parts:   The first part is a clinical interview with the neuropsychologist (Dr. Richie or Dr. Gayland). During the interview, the neuropsychologist will speak with you and the individual you brought to the appointment.    The second part of the evaluation is testing with the doctor's technician Neal or Luke). During the testing, the technician will ask you to remember different types of material, solve problems, and answer some questionnaires. Your family member will not be present for this portion of the evaluation.   Please note: We must reserve several hours of the neuropsychologist's time and the psychometrician's time for your evaluation appointment. As such, there is a No-Show fee of $100. If you are unable to attend any of your appointments, please contact our office as soon as possible to reschedule.      DRIVING: Regarding driving, in patients with progressive memory problems, driving will be impaired. We advise to have someone else do the driving if trouble finding directions or if  minor accidents are reported. Independent driving assessment is available to determine safety of driving.   If you are interested in the driving assessment, you can contact the following:  [facility] [employer] [facility] in [place] [phone]  [facility]  [phone]  [facility] [phone]  [facility] [phone] or [phone]   FALL PRECAUTIONS: Be cautious when walking. Scan the area for obstacles that may increase the risk of trips and falls. When getting up in the mornings, sit up at the edge of the bed for a few minutes before getting out of bed. Consider elevating the bed at the head end to avoid drop of blood pressure when getting up. Walk always in a well-lit room (use night lights in the walls). Avoid area rugs or power cords from appliances in the middle of the walkways. Use a walker or a cane if necessary and consider physical therapy for balance exercise. Get your eyesight checked regularly.  FINANCIAL OVERSIGHT: Supervision, especially oversight when making financial decisions or transactions is also recommended.  HOME SAFETY: Consider the safety of the kitchen when operating appliances like stoves, microwave oven, and blender. Consider having supervision and share cooking responsibilities until no longer able to participate in those. Accidents with firearms and other hazards in the house should be identified and addressed as well.   ABILITY TO BE LEFT ALONE: If patient is unable to contact 911 operator, consider using LifeLine, or when the need is there, arrange for someone to stay with patients. Smoking is a fire hazard, consider supervision or cessation. Risk of wandering should be assessed by caregiver and if detected at any point, supervision and safe proof recommendations should be instituted.  MEDICATION SUPERVISION: Inability to self-administer medication needs to be constantly addressed. Implement a mechanism to ensure safe administration of the medications.      Mediterranean Diet A Mediterranean diet refers to food and lifestyle choices that are based on the traditions of countries located on the [employer] [place]. This way of eating has been shown to help prevent certain conditions and improve  outcomes for people who have chronic diseases, like kidney disease and heart disease. What are tips for following this plan? Lifestyle  Cook and eat meals together with your family, when possible. Drink enough fluid to keep your urine clear or pale yellow. Be physically active every day. This includes: Aerobic exercise like running or swimming. Leisure activities like gardening, walking, or housework. Get 7-8 hours of sleep each night. If recommended by your health care provider, drink red wine in moderation. This means 1 glass a day for nonpregnant women and 2 glasses a day for men. A glass of wine equals 5 oz (150 mL). Reading food labels  Check the serving size of packaged foods. For foods such as rice and pasta, the serving size refers to the amount of cooked product, not dry. Check the total fat in packaged foods. Avoid foods that have saturated fat or trans fats. Check the ingredients list for added sugars, such as corn syrup. Shopping  At the grocery store, buy most of your food from the areas near the walls of the store. This includes: Fresh fruits and vegetables (produce). Grains, beans, nuts, and seeds. Some of these may be available in unpackaged forms or large amounts (in bulk). Fresh seafood. Poultry and eggs. Low-fat dairy products. Buy whole ingredients instead of prepackaged foods. Buy fresh fruits and vegetables in-season from local farmers markets. Buy frozen fruits and vegetables in resealable bags. If you do  not have access to quality fresh seafood, buy precooked frozen shrimp or canned fish, such as tuna, salmon, or sardines. Buy small amounts of raw or cooked vegetables, salads, or olives from the deli or salad bar at your store. Stock your pantry so you always have certain foods on hand, such as olive oil, canned tuna, canned tomatoes, rice, pasta, and beans. Cooking  Cook foods with extra-virgin olive oil instead of using butter or other vegetable oils. Have meat  as a side dish, and have vegetables or grains as your main dish. This means having meat in small portions or adding small amounts of meat to foods like pasta or stew. Use beans or vegetables instead of meat in common dishes like chili or lasagna. Experiment with different cooking methods. Try roasting or broiling vegetables instead of steaming or sauteing them. Add frozen vegetables to soups, stews, pasta, or rice. Add nuts or seeds for added healthy fat at each meal. You can add these to yogurt, salads, or vegetable dishes. Marinate fish or vegetables using olive oil, lemon juice, garlic, and fresh herbs. Meal planning  Plan to eat 1 vegetarian meal one day each week. Try to work up to 2 vegetarian meals, if possible. Eat seafood 2 or more times a week. Have healthy snacks readily available, such as: Vegetable sticks with hummus. Greek yogurt. Fruit and nut trail mix. Eat balanced meals throughout the week. This includes: Fruit: 2-3 servings a day Vegetables: 4-5 servings a day Low-fat dairy: 2 servings a day Fish, poultry, or lean meat: 1 serving a day Beans and legumes: 2 or more servings a week Nuts and seeds: 1-2 servings a day Whole grains: 6-8 servings a day Extra-virgin olive oil: 3-4 servings a day Limit red meat and sweets to only a few servings a month What are my food choices? Mediterranean diet Recommended Grains: Whole-grain pasta. Brown rice. Bulgar wheat. Polenta. Couscous. Whole-wheat bread. [place] Madeira. Vegetables: Artichokes. Beets. Broccoli. Cabbage. Carrots. Eggplant. Green beans. Chard. Kale. Spinach. Onions. Leeks. Peas. Squash. Tomatoes. Peppers. Radishes. Fruits: Apples. Apricots. Avocado. Berries. Bananas. Cherries. Dates. Figs. Grapes. Lemons. Melon. Oranges. Peaches. Plums. Pomegranate. Meats and other protein foods: Beans. Almonds. Sunflower seeds. Pine nuts. Peanuts. Cod. Salmon. Scallops. Shrimp. Tuna. Tilapia. Clams. Oysters. Eggs. Dairy: Low-fat  milk. Cheese. Greek yogurt. Beverages: Water . Red wine. Herbal tea. Fats and oils: Extra virgin olive oil. Avocado oil. Grape seed oil. Sweets and desserts: [place] yogurt with honey. Baked apples. Poached pears. Trail mix. Seasoning and other foods: Basil. Cilantro. Coriander. Cumin. Mint. Parsley. Sage. Rosemary. Tarragon. Garlic. Oregano. Thyme. Pepper. Balsalmic vinegar. Tahini. Hummus. Tomato sauce. Olives. Mushrooms. Limit these Grains: Prepackaged pasta or rice dishes. Prepackaged cereal with added sugar. Vegetables: Deep fried potatoes (french fries). Fruits: Fruit canned in syrup. Meats and other protein foods: Beef. Pork. Lamb. Poultry with skin. Hot dogs. [place]. Dairy: Ice cream. Sour cream. Whole milk. Beverages: Juice. Sugar-sweetened soft drinks. Beer. Liquor and spirits. Fats and oils: Butter. Canola oil. Vegetable oil. Beef fat (tallow). Lard. Sweets and desserts: Cookies. Cakes. Pies. Candy. Seasoning and other foods: Mayonnaise. Premade sauces and marinades. The items listed may not be a complete list. Talk with your dietitian about what dietary choices are right for you. Summary The Mediterranean diet includes both food and lifestyle choices. Eat a variety of fresh fruits and vegetables, beans, nuts, seeds, and whole grains. Limit the amount of red meat and sweets that you eat. Talk with your health care provider about whether it is safe for  you to drink red wine in moderation. This means 1 glass a day for nonpregnant women and 2 glasses a day for men. A glass of wine equals 5 oz (150 mL). This information is not intended to replace advice given to you by your health care provider. Make sure you discuss any questions you have with your health care provider. Document Released: 05/23/2016 Document Revised: 06/25/2016 Document Reviewed: 05/23/2016 Elsevier Interactive Patient Education  2017 [employer].

## 2024-04-08 NOTE — Progress Notes (Signed)
 Patient presents today for follow-up injection subtalar joint to left.  Doing about 7580% better.  Still little bit sore.   Physical exam:  General appearance: Pleasant, and in no acute distress. AOx3.  Vascular: Pedal pulses: DP 2/4 bilaterally, PT 2/4 bilaterally.  Moderate edema lower legs bilaterally. Capillary fill time immediate.  Neurological: Light touch intact feet bilaterally.  Normal Achilles reflex bilaterally.    Dermatologic:   Skin normal temperature bilaterally.  Skin normal color, tone, and texture bilaterally.   Musculoskeletal: Tenderness to palpation in the sinus tarsi and subtalar joint left.  No tenderness along the peroneal tendons today.  Some tenderness with range of motion subtalar joint left    Diagnosis: 1.  Arthralgia subtalar joint left  Plan: -Continue icing and using Voltaren gel as needed.  Wear proper shoes. -injected 3cc 2:1 mixture 0.5 cc Marcaine:Kenolog 10mg /75ml at subtalar joint the left at sinus tarsi.    Return 3 weeks follow-up injection

## 2024-04-09 ENCOUNTER — Ambulatory Visit: Payer: Self-pay | Admitting: Physician Assistant

## 2024-04-09 DIAGNOSIS — R413 Other amnesia: Secondary | ICD-10-CM | POA: Insufficient documentation

## 2024-04-09 NOTE — Progress Notes (Signed)
 No answer at 1:24pm 04/09/2024

## 2024-04-12 NOTE — Progress Notes (Signed)
 No answer ar 2:57 04/12/2024

## 2024-04-13 NOTE — Progress Notes (Signed)
 Call son [phone]

## 2024-04-13 NOTE — Progress Notes (Signed)
 No answer at [facility] 04/13/2024

## 2024-04-14 ENCOUNTER — Other Ambulatory Visit

## 2024-04-14 ENCOUNTER — Encounter: Payer: Self-pay | Admitting: Physician Assistant

## 2024-04-15 ENCOUNTER — Encounter: Payer: Self-pay | Admitting: Physician Assistant

## 2024-04-21 ENCOUNTER — Ambulatory Visit
Admission: RE | Admit: 2024-04-21 | Discharge: 2024-04-21 | Disposition: A | Discharge status: Home / Self Care | Source: Ambulatory Visit | Attending: Internal Medicine | Admitting: Internal Medicine

## 2024-04-21 DIAGNOSIS — Z1231 Encounter for screening mammogram for malignant neoplasm of breast: Secondary | ICD-10-CM

## 2024-04-22 ENCOUNTER — Other Ambulatory Visit

## 2024-04-22 ENCOUNTER — Ambulatory Visit

## 2024-04-25 ENCOUNTER — Ambulatory Visit
Admission: RE | Admit: 2024-04-25 | Discharge: 2024-04-25 | Disposition: A | Discharge status: Home / Self Care | Source: Ambulatory Visit | Attending: Physician Assistant | Admitting: Physician Assistant

## 2024-04-25 DIAGNOSIS — R413 Other amnesia: Secondary | ICD-10-CM | POA: Diagnosis not present

## 2024-04-29 ENCOUNTER — Ambulatory Visit: Admitting: Podiatry

## 2024-05-05 DIAGNOSIS — K8681 Exocrine pancreatic insufficiency: Secondary | ICD-10-CM | POA: Diagnosis not present

## 2024-05-05 DIAGNOSIS — E1151 Type 2 diabetes mellitus with diabetic peripheral angiopathy without gangrene: Secondary | ICD-10-CM | POA: Diagnosis not present

## 2024-05-05 DIAGNOSIS — R600 Localized edema: Secondary | ICD-10-CM | POA: Diagnosis not present

## 2024-05-05 DIAGNOSIS — R350 Frequency of micturition: Secondary | ICD-10-CM | POA: Diagnosis not present

## 2024-05-05 DIAGNOSIS — N390 Urinary tract infection, site not specified: Secondary | ICD-10-CM | POA: Diagnosis not present

## 2024-05-05 DIAGNOSIS — G40909 Epilepsy, unspecified, not intractable, without status epilepticus: Secondary | ICD-10-CM | POA: Diagnosis not present

## 2024-05-05 DIAGNOSIS — Z1389 Encounter for screening for other disorder: Secondary | ICD-10-CM | POA: Diagnosis not present

## 2024-05-05 DIAGNOSIS — K921 Melena: Secondary | ICD-10-CM | POA: Diagnosis not present

## 2024-05-05 DIAGNOSIS — R051 Acute cough: Secondary | ICD-10-CM | POA: Diagnosis not present

## 2024-05-05 DIAGNOSIS — R300 Dysuria: Secondary | ICD-10-CM | POA: Diagnosis not present

## 2024-05-05 DIAGNOSIS — I739 Peripheral vascular disease, unspecified: Secondary | ICD-10-CM | POA: Diagnosis not present

## 2024-05-05 DIAGNOSIS — R413 Other amnesia: Secondary | ICD-10-CM | POA: Diagnosis not present

## 2024-05-05 DIAGNOSIS — E538 Deficiency of other specified B group vitamins: Secondary | ICD-10-CM | POA: Diagnosis not present

## 2024-05-05 DIAGNOSIS — J189 Pneumonia, unspecified organism: Secondary | ICD-10-CM | POA: Diagnosis not present

## 2024-05-05 DIAGNOSIS — D509 Iron deficiency anemia, unspecified: Secondary | ICD-10-CM | POA: Diagnosis not present

## 2024-05-05 DIAGNOSIS — D649 Anemia, unspecified: Secondary | ICD-10-CM | POA: Diagnosis not present

## 2024-05-05 DIAGNOSIS — I2510 Atherosclerotic heart disease of native coronary artery without angina pectoris: Secondary | ICD-10-CM | POA: Diagnosis not present

## 2024-05-06 ENCOUNTER — Ambulatory Visit: Admitting: Podiatry

## 2024-05-07 ENCOUNTER — Telehealth: Payer: Self-pay | Admitting: Gastroenterology

## 2024-05-07 NOTE — Telephone Encounter (Signed)
 Pt scheduled to see Deanna May NP 05/19/24@10am . Pt aware of appt.

## 2024-05-07 NOTE — Telephone Encounter (Signed)
 Called patient regarding a urgent referral we received for black stools and worsening anemia. Patient states her stool will be black for a long period of time and will go back normal. Next available is in September. Patient requesting a call to discuss sooner options to help. Please advise, thank you

## 2024-05-17 ENCOUNTER — Ambulatory Visit: Admitting: Physician Assistant

## 2024-05-19 ENCOUNTER — Other Ambulatory Visit (INDEPENDENT_AMBULATORY_CARE_PROVIDER_SITE_OTHER)

## 2024-05-19 ENCOUNTER — Ambulatory Visit (INDEPENDENT_AMBULATORY_CARE_PROVIDER_SITE_OTHER): Admitting: Gastroenterology

## 2024-05-19 VITALS — BP 110/60 | HR 122 | Ht 59.45 in | Wt 164.1 lb

## 2024-05-19 DIAGNOSIS — K449 Diaphragmatic hernia without obstruction or gangrene: Secondary | ICD-10-CM | POA: Diagnosis not present

## 2024-05-19 DIAGNOSIS — K219 Gastro-esophageal reflux disease without esophagitis: Secondary | ICD-10-CM | POA: Diagnosis not present

## 2024-05-19 DIAGNOSIS — R194 Change in bowel habit: Secondary | ICD-10-CM

## 2024-05-19 DIAGNOSIS — D509 Iron deficiency anemia, unspecified: Secondary | ICD-10-CM

## 2024-05-19 DIAGNOSIS — K8681 Exocrine pancreatic insufficiency: Secondary | ICD-10-CM

## 2024-05-19 DIAGNOSIS — Z9049 Acquired absence of other specified parts of digestive tract: Secondary | ICD-10-CM | POA: Diagnosis not present

## 2024-05-19 DIAGNOSIS — K921 Melena: Secondary | ICD-10-CM

## 2024-05-19 LAB — CBC WITH DIFFERENTIAL/PLATELET
Basophils Absolute: 0.1 K/uL (ref 0.0–0.1)
Basophils Relative: 0.6 % (ref 0.0–3.0)
Eosinophils Absolute: 0.4 K/uL (ref 0.0–0.7)
Eosinophils Relative: 3.9 % (ref 0.0–5.0)
HCT: 34.7 % — ABNORMAL LOW (ref 36.0–46.0)
Hemoglobin: 11.2 g/dL — ABNORMAL LOW (ref 12.0–15.0)
Lymphocytes Relative: 21.5 % (ref 12.0–46.0)
Lymphs Abs: 2.2 K/uL (ref 0.7–4.0)
MCHC: 32.3 g/dL (ref 30.0–36.0)
MCV: 87.6 fl (ref 78.0–100.0)
Monocytes Absolute: 0.6 K/uL (ref 0.1–1.0)
Monocytes Relative: 5.9 % (ref 3.0–12.0)
Neutro Abs: 7.1 K/uL (ref 1.4–7.7)
Neutrophils Relative %: 68.1 % (ref 43.0–77.0)
Platelets: 267 K/uL (ref 150.0–400.0)
RBC: 3.96 Mil/uL (ref 3.87–5.11)
RDW: 16.2 % — ABNORMAL HIGH (ref 11.5–15.5)
WBC: 10.3 K/uL (ref 4.0–10.5)

## 2024-05-19 LAB — IBC + FERRITIN
Ferritin: 17.1 ng/mL (ref 10.0–291.0)
Iron: 23 ug/dL — ABNORMAL LOW (ref 42–145)
Saturation Ratios: 6.4 % — ABNORMAL LOW (ref 20.0–50.0)
TIBC: 361.2 ug/dL (ref 250.0–450.0)
Transferrin: 258 mg/dL (ref 212.0–360.0)

## 2024-05-19 NOTE — Patient Instructions (Addendum)
 Diarrhea Over the counter imodium  , can take 1 tablet twice daily, prn when going out  GERD Continue pantoprazole  40mg  po twice daily  Your provider has requested that you go to the basement level for lab work before leaving today. Press B on the elevator. The lab is located at the first door on the left as you exit the elevator.  _______________________________________________________  If your blood pressure at your visit was 140/90 or greater, please contact your primary care physician to follow up on this.  _______________________________________________________  If you are age 77 or older, your body mass index should be between 23-30. Your Body mass index is 32.65 kg/m. If this is out of the aforementioned range listed, please consider follow up with your Primary Care Provider.  If you are age 74 or younger, your body mass index should be between 19-25. Your Body mass index is 32.65 kg/m. If this is out of the aformentioned range listed, please consider follow up with your Primary Care Provider.   ________________________________________________________  The [facility] providers would like to encourage you to use MYCHART to communicate with providers for non-urgent requests or questions.  Due to long hold times on the telephone, sending your provider a message by [facility] Derran Sear be a faster and more efficient way to get a response.  Please allow 48 business hours for a response.  Please remember that this is for non-urgent requests.  _______________________________________________________  [facility] is using a team-based approach to care.  Your team is made up of your doctor and two to three APPS. Our APPS (Nurse Practitioners and Physician Assistants) work with your physician to ensure care continuity for you. They are fully qualified to address your health concerns and develop a treatment plan. They communicate directly with your gastroenterologist to care for you. Seeing  the Advanced Practice Practitioners on your physician's team can help you by facilitating care more promptly, often allowing for earlier appointments, access to diagnostic testing, procedures, and other specialty referrals.   Thank you for trusting me with your gastrointestinal care. Mercedes Valeriano, FNP-C

## 2024-05-19 NOTE — Progress Notes (Signed)
 Chief Complaint:black stools, anemia Primary GI Doctor: Dr. Stacia  HPI: 78 y.o. female with a past medical history of CVA 2023 on Plavix  status post carotid stenting, PAD, congestive heart failure, type 2 diabetes, CAD multivessel recent cath 02/12/2023 status post DES to ostial RCA, COPD, CKD stage IV, chronic pancreatitis,  2018 severe ischemic colitis status post right hemicolectomy with ileostomy and then reversal.  And others listed below presents for  evaluation of black stools, anemia.  Patient last seen by [facility] in GI office on 10/03/23 for rectal pain and hematochezia. Treated for anal fissures.  Nuclear stress test 2020 normal perfusion.  LVEF 45 to 50%   Admitted July 2023 with strokelike symptoms.  CTA and transcranial Doppler showed vascular compromise and patient was discharged on aspirin  and Plavix .   Patient admitted April 2024 for chest pain and underwent heart catheterization.  Returned for PCI [name] 2024.   Hospitalized July 2024 for altered mental status and admitted for rhabdomyolysis, AKI, UTI, and pneumonia.   Last seen June 2024 by Alan Coombs.  At that time she was having chronic diarrhea post right hemicolectomy managed with Lomotil  and Questran .  MRI 2022 showed no chronic pancreatitis.  Previous diagnosis of EPI maintained on Zenpep .  Xifaxan  is too expensive and she was given a course of Flagyl  for 10 days February 2024 with improvement.   At last visit patient underwent stool studies including repeat pancreatic elastase which she has not completed. C diff was negative.   Patient was admitted 06/04/2023 shortness of breath found to have severe hypoxia ultimately becoming intubated and admitted to ICU.  Developed severe encephalopathy and found to have CVA.   Hospital admission complicated by hematochezia 9/17.  Required 4 units PRBCs while admitted.  Patient's presentation was most consistent with a lower GI bleed and due to her history of unsuccessful  colonoscopy is recommended to avoid endoscopic procedures.  Her aspirin  and Brilinta  were held and bleeding spontaneously stopped.  Interval History    Patient presents for follow-up for chronic diarrhea post right hemicolectomy. She was recently treated for anal fissure back in December 2024 with Diltiazem  2%/lidocaine  5% compound cream. She reports some improvement with the topical prescribed cream. She has intermittent rectal irritation from frequent bowel movements and wiping. She is using the OTC Desitin ointment.  She reports she has intermittent episodes of dark stools that will last for a few weeks then subside. She notes the last dark stool was about 3 weeks ago. She has not taken any OTC Pepto Bismul. She does not think she is on oral iron . She said her daughter oversees her medications.      Patient has 3 or more loose stools per day on average. She has been using OTC Imodium ,   she only takes 1 tablet po daily.  Patient with history of reported chronic pancreatitis and takes pancrelipase  with meals.    Patient has history of GERD and taking Pantoprazole  40 mg twice daily.  Patient denies dysphagia. She has mild nausea, and uses ondansetron  prn.  She takes few times a week. Appetite fair, reports sometimes she is not hungry. She has lost 5lbs over the last 2 months. She has been trying to have smaller snacks throughout the day to keep her weight on.  PREVIOUS GI WORKUP    EGD 11/01/2021 that showed diffuse mild gastritis with erythema friability in the gastric fundus and body and a few scattered erosions in the cardia consistent with Ole erosions as well as  a medium sized hiatal hernia. Biopsies showed mild chronic gastritis no H. pylori and parietal cell hyperplasia  01/02/2023 CT AB and pelvis without contrast unremarkable Patient follows with Dr. Peter Swaziland cardiology. 02/12/2023 patient had unstable angina, status post DES to ostial RCA. Will need to be on Plavix  and aspirin  for close  year. 10/10/23 GI profile negative  Wt Readings from Last 3 Encounters:  05/19/24 164 lb 2 oz (74.4 kg)  04/08/24 169 lb (76.7 kg)  10/03/23 162 lb 2 oz (73.5 kg)    Past Medical History:  Diagnosis Date   Anxiety    Arthritis    back    Blind loop syndrome    Clotting disorder (HCC)    Hx Clot in leg per pt    Colostomy present [facility])    Coronary artery disease    a. known 60-70% mid-LAD stenosis with caths in 1999, 2002, 2006, and 2012 showing stable anatomy b. 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HCC)    Diabetes mellitus    Diverticulosis    Dizziness    chronic   Dyslipidemia    Fatty liver    GERD (gastroesophageal reflux disease)    hiatial hernia   Heart murmur    Hiatal hernia    Hypertension    Irritable bowel syndrome    Pancreatitis, chronic (HCC)    Peripheral vascular disease (HCC) 02/20/2010   s/p stent of left SFA; carotid stenosis   Seizure (HCC) 11/04/2018   last 1 yr 2020 per pt unsure month    Sleep apnea    no cpap    Stroke (HCC)    Thyroid  nodule    Vitamin B12 deficiency     Past Surgical History:  Procedure Laterality Date   ABDOMINAL HYSTERECTOMY     partial   AUGMENTATION MAMMAPLASTY     bilateral 1976 retro    BREAST ENHANCEMENT SURGERY     CARDIAC CATHETERIZATION  07/07/98;08/31/01;03/04/05   60 -70% LAD   CATARACT EXTRACTION Bilateral    COLON RESECTION N/A 10/14/2017   Procedure: EXPLORATORY LAPAROTOMY, EXTENDED RIGHT COLECTOMY; APPLICATION OF ABDOMINAL VACUUM DRESSING;  Surgeon: Curvin Deward MOULD, MD;  Location: [facility] ORS;  Service: General;  Laterality: N/A;   COLONOSCOPY     CORONARY ATHERECTOMY N/A 02/12/2023   Procedure: CORONARY ATHERECTOMY;  Surgeon: Swaziland, Peter M, MD;  Location: [facility] INVASIVE CV LAB;  Service: Cardiovascular;  Laterality: N/A;   CORONARY STENT INTERVENTION N/A 02/12/2023   Procedure: CORONARY STENT INTERVENTION;  Surgeon: Swaziland, Peter M, MD;  Location: [facility] INVASIVE CV LAB;  Service:  Cardiovascular;  Laterality: N/A;   CORONARY ULTRASOUND/IVUS N/A 02/12/2023   Procedure: Coronary Ultrasound/IVUS;  Surgeon: Swaziland, Peter M, MD;  Location: [facility] INVASIVE CV LAB;  Service: Cardiovascular;  Laterality: N/A;   ENDOBRONCHIAL ULTRASOUND Bilateral 02/24/2017   Procedure: ENDOBRONCHIAL ULTRASOUND;  Surgeon: Shelah Lamar RAMAN, MD;  Location: [facility] ENDOSCOPY;  Service: Cardiopulmonary;  Laterality: Bilateral;   FEMORAL ARTERY STENT     Left leg   LAPAROTOMY N/A 10/16/2017   Procedure: EXPLORATORY LAPAROTOMY, PARTIAL OMENTECTOMY, RESECTION ISCHEMIC ILEUM 130CM, APPLICATION OF VAC ABDOMINAL DRESSING;  Surgeon: Eletha Boas, MD;  Location: [facility] ORS;  Service: General;  Laterality: N/A;   LAPAROTOMY N/A 10/18/2017   Procedure: RE-EXPLORATION OF ABDOMEN, ILEOSTOMY CREATION;  Surgeon: Aron Shoulders, MD;  Location: [facility] ORS;  Service: General;  Laterality: N/A;   LEFT HEART CATH AND CORONARY ANGIOGRAPHY N/A 01/29/2023   Procedure: LEFT HEART CATH AND  CORONARY ANGIOGRAPHY;  Surgeon: Swaziland, Peter M, MD;  Location: [facility] INVASIVE CV LAB;  Service: Cardiovascular;  Laterality: N/A;   LUNG SURGERY  03/2017   Benign polyps removed   PATELLA REALIGNMENT Left    POLYPECTOMY     SIGMOIDOSCOPY     TONSILLECTOMY     TRANSCAROTID ARTERY REVASCULARIZATION  Right 04/18/2022   Procedure: Transcarotid Artery Revascularization;  Surgeon: Lanis Fonda BRAVO, MD;  Location: [facility] OR;  Service: Vascular;  Laterality: Right;    Current Outpatient Medications  Medication Sig Dispense Refill   albuterol  (VENTOLIN  HFA) 108 (90 Base) MCG/ACT inhaler Inhale 1-2 puffs into the lungs every 6 (six) hours as needed for wheezing or shortness of breath.     amLODipine  (NORVASC ) 10 MG tablet Take 1 tablet (10 mg total) by mouth daily. 30 tablet 0   atorvastatin  (LIPITOR) 40 MG tablet Take 1 tablet (40 mg total) by mouth daily. 30 tablet 0   cefdinir (OMNICEF) 300 MG capsule Take 300 mg by mouth 2 (two) times daily.     clopidogrel  (PLAVIX )  75 MG tablet Take 1 tablet (75 mg total) by mouth daily. 30 tablet 0   diltiazem  2 % GEL Add 5% lidocaine / Apply to rectum three times a day for 6-8 weeks 60 g 1   diphenoxylate -atropine  (LOMOTIL ) 2.5-0.025 MG tablet Take 1 tablet by mouth 4 (four) times daily as needed.     doxepin (SINEQUAN) 10 MG/ML solution Take by mouth at bedtime.     feeding supplement (ENSURE ENLIVE / ENSURE PLUS) LIQD Take 237 mLs by mouth 2 (two) times daily between meals.     fluticasone  (FLONASE ) 50 MCG/ACT nasal spray Place 2 sprays into both nostrils daily as needed for allergies.     furosemide  (LASIX ) 20 MG tablet Take 1 tablet (20 mg total) by mouth daily as needed. 30 tablet 0   HYDROcodone -acetaminophen  (NORCO) 10-325 MG tablet Take 1 tablet by mouth 4 (four) times daily as needed.     isosorbide  mononitrate (IMDUR ) 60 MG 24 hr tablet TAKE 1 TABLET BY MOUTH EVERY DAY 90 tablet 0   levETIRAcetam  (KEPPRA ) 500 MG tablet TAKE 1 TABLET BY MOUTH TWICE A DAY 180 tablet 2   Lidocaine  HCl (ASPERCREME LIDOCAINE ) 4 % CREA 1 Application.     meclizine  (ANTIVERT ) 25 MG tablet Take 0.5 tablets (12.5 mg total) by mouth 3 (three) times daily as needed for dizziness. 30 tablet 0   metoprolol  succinate (TOPROL -XL) 100 MG 24 hr tablet Take 100 mg by mouth daily.     mirtazapine  (REMERON ) 15 MG tablet Take 15 mg by mouth at bedtime.     montelukast  (SINGULAIR ) 10 MG tablet Take 10 mg by mouth daily.     mupirocin  ointment (BACTROBAN ) 2 % Apply 1 Application topically 2 (two) times daily.     nitroGLYCERIN  (NITROSTAT ) 0.4 MG SL tablet Place 1 tablet (0.4 mg total) under the tongue every 5 (five) minutes as needed for chest pain. 90 tablet 3   nystatin-triamcinolone  (MYCOLOG II) cream Apply 1 Application topically 2 (two) times daily.     ondansetron  (ZOFRAN ) 4 MG tablet Take 1 tablet (4 mg total) by mouth every 6 (six) hours as needed for nausea or vomiting. 40 tablet 1   Pancrelipase , Lip-Prot-Amyl, (ZENPEP ) 40000-126000 units CPEP  Take 2 capsules with meals and one capsule with snacks. 240 capsule 0   pantoprazole  (PROTONIX ) 40 MG tablet Take 1 tablet (40 mg total) by mouth 2 (two) times daily. 180 tablet 2  Polysaccharide-Iron  Complex 150 MG CAPS Take 1 tablet by mouth daily.     venlafaxine  XR (EFFEXOR -XR) 75 MG 24 hr capsule Take 1 capsule (75 mg total) by mouth daily.     vitamin B-12 (CYANOCOBALAMIN ) 500 MCG tablet Take 500 mcg by mouth daily.     Vitamin D , Ergocalciferol , (DRISDOL ) 1.25 MG (50000 UT) CAPS capsule Take 50,000 Units by mouth every Wednesday.      No current facility-administered medications for this visit.    Allergies as of 05/19/2024 - Review Complete 05/19/2024  Allergen Reaction Noted   Adhesive [tape] Other (See Comments) 04/09/2018   Other Rash 04/09/2018   Ace inhibitors Other (See Comments) 02/21/2021   Clonidine   06/15/2023   Sulfa antibiotics Other (See Comments) 02/21/2021   Topiramate  02/21/2021   Codeine Other (See Comments) 11/08/2010   Iodinated contrast media Rash 11/08/2010   Latex Rash 01/02/2023    Family History  Problem Relation Age of Onset   Hypertension Mother    Osteoarthritis Mother    Pulmonary embolism Mother    Hypertension Father        aneurysm   Stroke Father    Colon cancer Paternal Grandfather    Stroke Other    Breast cancer Neg Hx    Colon polyps Neg Hx    Esophageal cancer Neg Hx    Rectal cancer Neg Hx    Stomach cancer Neg Hx    Pancreatic cancer Neg Hx    Liver disease Neg Hx     Review of Systems:    Constitutional: No weight loss, fever, chills, weakness or fatigue HEENT: Eyes: No change in vision               Ears, Nose, Throat:  No change in hearing or congestion Skin: No rash or itching Cardiovascular: No chest pain, chest pressure or palpitations   Respiratory: No SOB or cough Gastrointestinal: See HPI and otherwise negative Genitourinary: No dysuria or change in urinary frequency Neurological: No headache, dizziness or  syncope Musculoskeletal: No new muscle or joint pain Hematologic: No bleeding or bruising Psychiatric: No history of depression or anxiety    Physical Exam:  Vital signs: BP 110/60 (BP Location: Left Arm, Patient Position: Sitting, Cuff Size: Normal)   Pulse (!) 122   Ht 4' 11.45 (1.51 m) Comment: height without shoes  Wt 164 lb 2 oz (74.4 kg)   BMI 32.65 kg/m   Constitutional:  Pleasant  female appears to be in NAD, Well developed, Well nourished, alert and cooperative Throat: Oral cavity and pharynx without inflammation, swelling or lesion.  Respiratory: Respirations even and unlabored. Lungs clear to auscultation bilaterally.   No wheezes, crackles, or rhonchi.  Cardiovascular: Normal S1, S2. Regular rate and rhythm. +2 peripheral edema. No cyanosis or pallor.  Gastrointestinal:  Soft, nondistended, nontender. No rebound or guarding. Normal bowel sounds. Midline hernia noted. Rectal:  Not performed. Declined.  Anoscopy: Declined Msk:  Symmetrical without gross deformities. no deformity or joint abnormality.  Neurologic:  Alert and  oriented x4;  grossly normal neurologically.  Skin:   Dry and intact without significant lesions or rashes.  RELEVANT LABS AND IMAGING: CBC    Latest Ref Rng & Units 10/03/2023   10:19 AM 07/02/2023    4:44 PM 07/02/2023   10:44 AM  CBC  WBC 4.0 - 10.5 K/uL 22.2 Repeated and verified X2.     Hemoglobin 12.0 - 15.0 g/dL 88.9  8.8  8.8   Hematocrit 36.0 -  46.0 % 34.6  26.7  26.7   Platelets 150.0 - 400.0 K/uL 427.0        CMP     Latest Ref Rng & Units 10/03/2023   10:19 AM 07/02/2023    3:38 AM 07/01/2023    6:21 AM  CMP  Glucose 70 - 99 mg/dL 899  892  874   BUN 6 - 23 mg/dL 37  27  28   Creatinine 0.40 - 1.20 mg/dL 7.89  8.25  8.29   Sodium 135 - 145 mEq/L 139  134  133   Potassium 3.5 - 5.1 mEq/L 4.1  4.4  4.2   Chloride 96 - 112 mEq/L 105  105  108   CO2 19 - 32 mEq/L 22  21  20    Calcium  8.4 - 10.5 mg/dL 7.8  8.5  8.3   Total  Protein 6.0 - 8.3 g/dL 7.0     Total Bilirubin 0.2 - 1.2 mg/dL 0.3     Alkaline Phos 39 - 117 U/L 94     AST 0 - 37 U/L 14     ALT 0 - 35 U/L 10        Lab Results  Component Value Date   TSH 3.96 04/08/2024   04/08/24 b 12 378   Assessment: Encounter Diagnoses  Name Primary?   Iron  deficiency anemia, unspecified iron  deficiency anemia type Yes   Hiatal hernia with GERD without esophagitis    Melena    History of hemicolectomy    Exocrine pancreatic insufficiency    Altered bowel habits        78 year old female patient with numerous medical problems who presents with main complaint of intermittent episodes of melena.  Patient has history of iron  deficiency anemia and per records not currently on any oral iron  supplementation. Last Hgb from December improved from 8.8>11.  Will recheck levels today with reported melena.  Fecal occult in office is positive.  Last endoscopy in January 2023 did show a moderate size hiatal hernia with erosions consistent with Ole erosions.  Mild gastritis negative for H. pylori.  Patient will continue PPI therapy twice daily. She has a history of right extended hemicolectomy for ischemic colitis, and her most recent colonoscopy was unsuccessful due to restricted colonic mobility/narrowing. Only 25 cm of the colon was visualized. Will defer any procedures to Dr. Stacia due to numerous co morbidities.  I briefly discussed endoscopic procedures with patient and at this time did not seem to be on board with pursuing anything with anesthesia.  We will reevaluate pending her lab results.   Diarrhea with fecal incontinence, with extensive workup in the past. History of chronic pancreatitis, no stool elastase on file- has been ordered in past and never completed. Previous right hemicolectomy for ischemic colitis. CT abdomen pelvis without contrast 12/2022 unremarkable. US  abd RUQ showed hepatic steatosis otherwise unremarkable (04/2023) - patient does not like  powder medications such as Questran  -has tolerated OTC imodium  and enquires if she can increase dose from 1 tablet po daily to 2 tablets po daily -takes pancrelipase  with meals.  CKD stage IV Following with nephrology  Plan: -recheck CBC, iron  panel/TIBC today -fecal occult in office done -continue pantoprazole  40mg  twice daily -continue pancrelipase  with meals -Reinforced taking OTC imodium  1 tablet twice daily, prn for going out -defer procedures to Dr. Stacia, [name] consider hematology referral to monitor anemia  Thank you for the courtesy of this consult. Please call me with any questions or concerns.  Joanna Seger, FNP-C [facility] 05/19/2024, 10:48 AM  Cc: [name] MATSU, MD

## 2024-05-22 ENCOUNTER — Encounter (HOSPITAL_COMMUNITY): Payer: Self-pay | Admitting: Emergency Medicine

## 2024-05-22 ENCOUNTER — Ambulatory Visit (INDEPENDENT_AMBULATORY_CARE_PROVIDER_SITE_OTHER)

## 2024-05-22 ENCOUNTER — Other Ambulatory Visit: Payer: Self-pay

## 2024-05-22 ENCOUNTER — Ambulatory Visit (HOSPITAL_COMMUNITY)
Admission: EM | Admit: 2024-05-22 | Discharge: 2024-05-22 | Disposition: A | Discharge status: Home / Self Care | Attending: Family Medicine | Admitting: Family Medicine

## 2024-05-22 ENCOUNTER — Inpatient Hospital Stay (HOSPITAL_COMMUNITY)
Admission: EM | Admit: 2024-05-22 | Discharge: 2024-05-28 | DRG: 871 | Disposition: A | Discharge status: Home Health | Attending: Internal Medicine | Admitting: Internal Medicine

## 2024-05-22 DIAGNOSIS — J439 Emphysema, unspecified: Secondary | ICD-10-CM | POA: Diagnosis present

## 2024-05-22 DIAGNOSIS — G40909 Epilepsy, unspecified, not intractable, without status epilepticus: Secondary | ICD-10-CM | POA: Diagnosis present

## 2024-05-22 DIAGNOSIS — Z9842 Cataract extraction status, left eye: Secondary | ICD-10-CM

## 2024-05-22 DIAGNOSIS — A419 Sepsis, unspecified organism: Secondary | ICD-10-CM | POA: Diagnosis not present

## 2024-05-22 DIAGNOSIS — I700 Atherosclerosis of aorta: Secondary | ICD-10-CM | POA: Diagnosis present

## 2024-05-22 DIAGNOSIS — Z91199 Patient's noncompliance with other medical treatment and regimen due to unspecified reason: Secondary | ICD-10-CM

## 2024-05-22 DIAGNOSIS — I472 Ventricular tachycardia, unspecified: Secondary | ICD-10-CM | POA: Diagnosis not present

## 2024-05-22 DIAGNOSIS — Z79899 Other long term (current) drug therapy: Secondary | ICD-10-CM

## 2024-05-22 DIAGNOSIS — R0902 Hypoxemia: Secondary | ICD-10-CM

## 2024-05-22 DIAGNOSIS — N179 Acute kidney failure, unspecified: Secondary | ICD-10-CM | POA: Diagnosis present

## 2024-05-22 DIAGNOSIS — Z7902 Long term (current) use of antithrombotics/antiplatelets: Secondary | ICD-10-CM

## 2024-05-22 DIAGNOSIS — D631 Anemia in chronic kidney disease: Secondary | ICD-10-CM | POA: Diagnosis present

## 2024-05-22 DIAGNOSIS — K861 Other chronic pancreatitis: Secondary | ICD-10-CM | POA: Diagnosis present

## 2024-05-22 DIAGNOSIS — Z90710 Acquired absence of both cervix and uterus: Secondary | ICD-10-CM

## 2024-05-22 DIAGNOSIS — R0602 Shortness of breath: Secondary | ICD-10-CM

## 2024-05-22 DIAGNOSIS — Z8673 Personal history of transient ischemic attack (TIA), and cerebral infarction without residual deficits: Secondary | ICD-10-CM

## 2024-05-22 DIAGNOSIS — I5042 Chronic combined systolic (congestive) and diastolic (congestive) heart failure: Secondary | ICD-10-CM | POA: Diagnosis present

## 2024-05-22 DIAGNOSIS — J9601 Acute respiratory failure with hypoxia: Secondary | ICD-10-CM | POA: Diagnosis not present

## 2024-05-22 DIAGNOSIS — R262 Difficulty in walking, not elsewhere classified: Secondary | ICD-10-CM | POA: Diagnosis present

## 2024-05-22 DIAGNOSIS — K529 Noninfective gastroenteritis and colitis, unspecified: Secondary | ICD-10-CM | POA: Diagnosis present

## 2024-05-22 DIAGNOSIS — Z933 Colostomy status: Secondary | ICD-10-CM

## 2024-05-22 DIAGNOSIS — R600 Localized edema: Secondary | ICD-10-CM | POA: Diagnosis present

## 2024-05-22 DIAGNOSIS — G8929 Other chronic pain: Secondary | ICD-10-CM | POA: Diagnosis present

## 2024-05-22 DIAGNOSIS — I130 Hypertensive heart and chronic kidney disease with heart failure and stage 1 through stage 4 chronic kidney disease, or unspecified chronic kidney disease: Secondary | ICD-10-CM | POA: Diagnosis not present

## 2024-05-22 DIAGNOSIS — E66811 Obesity, class 1: Secondary | ICD-10-CM | POA: Diagnosis present

## 2024-05-22 DIAGNOSIS — J440 Chronic obstructive pulmonary disease with acute lower respiratory infection: Secondary | ICD-10-CM | POA: Diagnosis present

## 2024-05-22 DIAGNOSIS — E1140 Type 2 diabetes mellitus with diabetic neuropathy, unspecified: Secondary | ICD-10-CM | POA: Diagnosis present

## 2024-05-22 DIAGNOSIS — N184 Chronic kidney disease, stage 4 (severe): Secondary | ICD-10-CM | POA: Diagnosis present

## 2024-05-22 DIAGNOSIS — E1151 Type 2 diabetes mellitus with diabetic peripheral angiopathy without gangrene: Secondary | ICD-10-CM | POA: Diagnosis present

## 2024-05-22 DIAGNOSIS — I5032 Chronic diastolic (congestive) heart failure: Secondary | ICD-10-CM | POA: Diagnosis not present

## 2024-05-22 DIAGNOSIS — E1122 Type 2 diabetes mellitus with diabetic chronic kidney disease: Secondary | ICD-10-CM | POA: Diagnosis present

## 2024-05-22 DIAGNOSIS — L309 Dermatitis, unspecified: Secondary | ICD-10-CM | POA: Diagnosis present

## 2024-05-22 DIAGNOSIS — I2510 Atherosclerotic heart disease of native coronary artery without angina pectoris: Secondary | ICD-10-CM | POA: Diagnosis present

## 2024-05-22 DIAGNOSIS — J45909 Unspecified asthma, uncomplicated: Secondary | ICD-10-CM | POA: Diagnosis present

## 2024-05-22 DIAGNOSIS — Z6830 Body mass index (BMI) 30.0-30.9, adult: Secondary | ICD-10-CM | POA: Diagnosis not present

## 2024-05-22 DIAGNOSIS — Z91048 Other nonmedicinal substance allergy status: Secondary | ICD-10-CM

## 2024-05-22 DIAGNOSIS — Z9841 Cataract extraction status, right eye: Secondary | ICD-10-CM

## 2024-05-22 DIAGNOSIS — K760 Fatty (change of) liver, not elsewhere classified: Secondary | ICD-10-CM | POA: Diagnosis present

## 2024-05-22 DIAGNOSIS — D509 Iron deficiency anemia, unspecified: Secondary | ICD-10-CM | POA: Diagnosis present

## 2024-05-22 DIAGNOSIS — Z1152 Encounter for screening for COVID-19: Secondary | ICD-10-CM

## 2024-05-22 DIAGNOSIS — R918 Other nonspecific abnormal finding of lung field: Secondary | ICD-10-CM | POA: Diagnosis not present

## 2024-05-22 DIAGNOSIS — R791 Abnormal coagulation profile: Secondary | ICD-10-CM | POA: Diagnosis present

## 2024-05-22 DIAGNOSIS — Z885 Allergy status to narcotic agent status: Secondary | ICD-10-CM

## 2024-05-22 DIAGNOSIS — E785 Hyperlipidemia, unspecified: Secondary | ICD-10-CM | POA: Diagnosis present

## 2024-05-22 DIAGNOSIS — J189 Pneumonia, unspecified organism: Principal | ICD-10-CM | POA: Diagnosis present

## 2024-05-22 DIAGNOSIS — E872 Acidosis, unspecified: Secondary | ICD-10-CM | POA: Diagnosis not present

## 2024-05-22 DIAGNOSIS — R6520 Severe sepsis without septic shock: Secondary | ICD-10-CM | POA: Diagnosis present

## 2024-05-22 DIAGNOSIS — Z91040 Latex allergy status: Secondary | ICD-10-CM

## 2024-05-22 DIAGNOSIS — R042 Hemoptysis: Secondary | ICD-10-CM | POA: Diagnosis not present

## 2024-05-22 DIAGNOSIS — F32A Depression, unspecified: Secondary | ICD-10-CM | POA: Diagnosis present

## 2024-05-22 DIAGNOSIS — K219 Gastro-esophageal reflux disease without esophagitis: Secondary | ICD-10-CM | POA: Diagnosis present

## 2024-05-22 DIAGNOSIS — I739 Peripheral vascular disease, unspecified: Secondary | ICD-10-CM | POA: Diagnosis present

## 2024-05-22 DIAGNOSIS — Z91041 Radiographic dye allergy status: Secondary | ICD-10-CM

## 2024-05-22 DIAGNOSIS — Z8249 Family history of ischemic heart disease and other diseases of the circulatory system: Secondary | ICD-10-CM

## 2024-05-22 DIAGNOSIS — Z7401 Bed confinement status: Secondary | ICD-10-CM

## 2024-05-22 DIAGNOSIS — Z882 Allergy status to sulfonamides status: Secondary | ICD-10-CM

## 2024-05-22 DIAGNOSIS — Z87891 Personal history of nicotine dependence: Secondary | ICD-10-CM

## 2024-05-22 DIAGNOSIS — R609 Edema, unspecified: Secondary | ICD-10-CM | POA: Diagnosis not present

## 2024-05-22 DIAGNOSIS — G4733 Obstructive sleep apnea (adult) (pediatric): Secondary | ICD-10-CM | POA: Diagnosis present

## 2024-05-22 DIAGNOSIS — Z823 Family history of stroke: Secondary | ICD-10-CM

## 2024-05-22 DIAGNOSIS — Z888 Allergy status to other drugs, medicaments and biological substances status: Secondary | ICD-10-CM

## 2024-05-22 DIAGNOSIS — Z800 Family history of malignant neoplasm of digestive organs: Secondary | ICD-10-CM

## 2024-05-22 LAB — COMPREHENSIVE METABOLIC PANEL WITH GFR
ALT: 15 U/L (ref 0–44)
AST: 36 U/L (ref 15–41)
Albumin: 2.5 g/dL — ABNORMAL LOW (ref 3.5–5.0)
Alkaline Phosphatase: 105 U/L (ref 38–126)
Anion gap: 12 (ref 5–15)
BUN: 31 mg/dL — ABNORMAL HIGH (ref 8–23)
CO2: 20 mmol/L — ABNORMAL LOW (ref 22–32)
Calcium: 8.2 mg/dL — ABNORMAL LOW (ref 8.9–10.3)
Chloride: 100 mmol/L (ref 98–111)
Creatinine, Ser: 2.41 mg/dL — ABNORMAL HIGH (ref 0.44–1.00)
GFR, Estimated: 20 mL/min — ABNORMAL LOW (ref >60)
Glucose, Bld: 104 mg/dL — ABNORMAL HIGH (ref 70–99)
Potassium: 3.8 mmol/L (ref 3.5–5.1)
Sodium: 132 mmol/L — ABNORMAL LOW (ref 135–145)
Total Bilirubin: 1 mg/dL (ref 0.0–1.2)
Total Protein: 5.9 g/dL — ABNORMAL LOW (ref 6.5–8.1)

## 2024-05-22 LAB — RESP PANEL BY RT-PCR (RSV, FLU A&B, COVID)  RVPGX2
Influenza A by PCR: NEGATIVE
Influenza B by PCR: NEGATIVE
Resp Syncytial Virus by PCR: NEGATIVE
SARS Coronavirus 2 by RT PCR: NEGATIVE

## 2024-05-22 LAB — I-STAT CG4 LACTIC ACID, ED: Lactic Acid, Venous: 1.4 mmol/L (ref 0.5–1.9)

## 2024-05-22 LAB — I-STAT VENOUS BLOOD GAS, ED
Acid-base deficit: 6 mmol/L — ABNORMAL HIGH (ref 0.0–2.0)
Bicarbonate: 19.1 mmol/L — ABNORMAL LOW (ref 20.0–28.0)
Calcium, Ion: 1.09 mmol/L — ABNORMAL LOW (ref 1.15–1.40)
HCT: 33 % — ABNORMAL LOW (ref 36.0–46.0)
Hemoglobin: 11.2 g/dL — ABNORMAL LOW (ref 12.0–15.0)
O2 Saturation: 40 %
Potassium: 3.8 mmol/L (ref 3.5–5.1)
Sodium: 134 mmol/L — ABNORMAL LOW (ref 135–145)
TCO2: 20 mmol/L — ABNORMAL LOW (ref 22–32)
pCO2, Ven: 35.2 mmHg — ABNORMAL LOW (ref 44–60)
pH, Ven: 7.342 (ref 7.25–7.43)
pO2, Ven: 24 mmHg — CL (ref 32–45)

## 2024-05-22 LAB — CBC WITH DIFFERENTIAL/PLATELET
Abs Immature Granulocytes: 2.51 K/uL — ABNORMAL HIGH (ref 0.00–0.07)
Basophils Absolute: 0.1 K/uL (ref 0.0–0.1)
Basophils Relative: 0 %
Eosinophils Absolute: 0.2 K/uL (ref 0.0–0.5)
Eosinophils Relative: 1 %
HCT: 32.2 % — ABNORMAL LOW (ref 36.0–46.0)
Hemoglobin: 10.3 g/dL — ABNORMAL LOW (ref 12.0–15.0)
Immature Granulocytes: 7 %
Lymphocytes Relative: 4 %
Lymphs Abs: 1.5 K/uL (ref 0.7–4.0)
MCH: 28.3 pg (ref 26.0–34.0)
MCHC: 32 g/dL (ref 30.0–36.0)
MCV: 88.5 fL (ref 80.0–100.0)
Monocytes Absolute: 1.6 K/uL — ABNORMAL HIGH (ref 0.1–1.0)
Monocytes Relative: 4 %
Neutro Abs: 30.7 K/uL — ABNORMAL HIGH (ref 1.7–7.7)
Neutrophils Relative %: 84 %
Platelets: 236 K/uL (ref 150–400)
RBC: 3.64 MIL/uL — ABNORMAL LOW (ref 3.87–5.11)
RDW: 15.4 % (ref 11.5–15.5)
Smear Review: NORMAL
WBC: 36.6 K/uL — ABNORMAL HIGH (ref 4.0–10.5)
nRBC: 0 % (ref 0.0–0.2)

## 2024-05-22 LAB — D-DIMER, QUANTITATIVE: D-Dimer, Quant: 4.53 ug{FEU}/mL — ABNORMAL HIGH (ref 0.00–0.50)

## 2024-05-22 LAB — BRAIN NATRIURETIC PEPTIDE: B Natriuretic Peptide: 176.8 pg/mL — ABNORMAL HIGH (ref 0.0–100.0)

## 2024-05-22 MED ORDER — VITAMIN B-12 1000 MCG PO TABS
500.0000 ug | ORAL_TABLET | Freq: Every day | ORAL | Status: DC
  Administered 2024-05-23 – 2024-05-28 (×9): 500 ug via ORAL
  Filled 2024-05-22 (×5): qty 1
  Filled 2024-05-22: qty 5
  Filled 2024-05-22: qty 1
  Filled 2024-05-22: qty 5

## 2024-05-22 MED ORDER — PANTOPRAZOLE SODIUM 40 MG PO TBEC
40.0000 mg | DELAYED_RELEASE_TABLET | Freq: Two times a day (BID) | ORAL | Status: DC
  Administered 2024-05-22 – 2024-05-28 (×18): 40 mg via ORAL
  Filled 2024-05-22 (×12): qty 1

## 2024-05-22 MED ORDER — INSULIN ASPART 100 UNIT/ML IJ SOLN: 0.0000 [IU] | Freq: Every day | INTRAMUSCULAR | Status: DC

## 2024-05-22 MED ORDER — VANCOMYCIN VARIABLE DOSE PER UNSTABLE RENAL FUNCTION (PHARMACIST DOSING): Status: DC

## 2024-05-22 MED ORDER — INSULIN ASPART 100 UNIT/ML IJ SOLN
0.0000 [IU] | Freq: Three times a day (TID) | INTRAMUSCULAR | Status: DC
  Administered 2024-05-24 – 2024-05-28 (×3): 1 [IU] via SUBCUTANEOUS

## 2024-05-22 MED ORDER — GUAIFENESIN ER 600 MG PO TB12
600.0000 mg | ORAL_TABLET | Freq: Two times a day (BID) | ORAL | Status: DC
  Administered 2024-05-22 – 2024-05-28 (×18): 600 mg via ORAL
  Filled 2024-05-22 (×12): qty 1

## 2024-05-22 MED ORDER — DIPHENOXYLATE-ATROPINE 2.5-0.025 MG PO TABS
1.0000 | ORAL_TABLET | Freq: Four times a day (QID) | ORAL | Status: DC | PRN
  Administered 2024-05-23 – 2024-05-27 (×10): 1 via ORAL
  Filled 2024-05-22 (×6): qty 1

## 2024-05-22 MED ORDER — MONTELUKAST SODIUM 10 MG PO TABS
10.0000 mg | ORAL_TABLET | Freq: Every day | ORAL | Status: DC
  Administered 2024-05-22 – 2024-05-28 (×10): 10 mg via ORAL
  Filled 2024-05-22 (×7): qty 1

## 2024-05-22 MED ORDER — ALBUTEROL SULFATE (2.5 MG/3ML) 0.083% IN NEBU: 2.5000 mg | INHALATION_SOLUTION | RESPIRATORY_TRACT | Status: DC | PRN

## 2024-05-22 MED ORDER — DOXEPIN HCL 10 MG PO CAPS
10.0000 mg | ORAL_CAPSULE | Freq: Every day | ORAL | Status: DC
  Administered 2024-05-23 – 2024-05-27 (×8): 10 mg via ORAL
  Filled 2024-05-22 (×7): qty 1

## 2024-05-22 MED ORDER — MIRTAZAPINE 15 MG PO TABS
15.0000 mg | ORAL_TABLET | Freq: Every day | ORAL | Status: DC
  Administered 2024-05-22 – 2024-05-27 (×9): 15 mg via ORAL
  Filled 2024-05-22 (×6): qty 1

## 2024-05-22 MED ORDER — PANCRELIPASE (LIP-PROT-AMYL) 40000-126000 UNITS PO CPEP: 2.0000 | ORAL_CAPSULE | Freq: Every day | ORAL | Status: DC

## 2024-05-22 MED ORDER — SENNOSIDES-DOCUSATE SODIUM 8.6-50 MG PO TABS
1.0000 | ORAL_TABLET | Freq: Every evening | ORAL | Status: DC | PRN
  Filled 2024-05-22: qty 1

## 2024-05-22 MED ORDER — ISOSORBIDE MONONITRATE ER 60 MG PO TB24
60.0000 mg | ORAL_TABLET | Freq: Every day | ORAL | Status: DC
  Administered 2024-05-22 – 2024-05-23 (×2): 60 mg via ORAL
  Filled 2024-05-22: qty 2
  Filled 2024-05-22: qty 1

## 2024-05-22 MED ORDER — ACETAMINOPHEN 650 MG RE SUPP: 650.0000 mg | Freq: Four times a day (QID) | RECTAL | Status: DC | PRN

## 2024-05-22 MED ORDER — METOPROLOL SUCCINATE ER 50 MG PO TB24
100.0000 mg | ORAL_TABLET | Freq: Every day | ORAL | Status: DC
  Administered 2024-05-22 – 2024-05-28 (×10): 100 mg via ORAL
  Filled 2024-05-22: qty 4
  Filled 2024-05-22 (×6): qty 2

## 2024-05-22 MED ORDER — NITROGLYCERIN 0.4 MG SL SUBL: 0.4000 mg | SUBLINGUAL_TABLET | SUBLINGUAL | Status: DC | PRN

## 2024-05-22 MED ORDER — VENLAFAXINE HCL ER 75 MG PO CP24
75.0000 mg | ORAL_CAPSULE | Freq: Every day | ORAL | Status: DC
  Administered 2024-05-23 – 2024-05-28 (×9): 75 mg via ORAL
  Filled 2024-05-22 (×8): qty 1

## 2024-05-22 MED ORDER — ATORVASTATIN CALCIUM 40 MG PO TABS
40.0000 mg | ORAL_TABLET | Freq: Every day | ORAL | Status: DC
  Administered 2024-05-22 – 2024-05-28 (×10): 40 mg via ORAL
  Filled 2024-05-22 (×7): qty 1

## 2024-05-22 MED ORDER — FLUTICASONE PROPIONATE 50 MCG/ACT NA SUSP: 2.0000 | Freq: Every day | NASAL | Status: DC | PRN

## 2024-05-22 MED ORDER — MORPHINE SULFATE (PF) 4 MG/ML IV SOLN
4.0000 mg | Freq: Once | INTRAVENOUS | Status: AC
  Administered 2024-05-22: 4 mg via INTRAVENOUS
  Filled 2024-05-22: qty 1

## 2024-05-22 MED ORDER — LEVETIRACETAM 500 MG PO TABS
500.0000 mg | ORAL_TABLET | Freq: Two times a day (BID) | ORAL | Status: DC
  Administered 2024-05-22 – 2024-05-28 (×18): 500 mg via ORAL
  Filled 2024-05-22 (×12): qty 1

## 2024-05-22 MED ORDER — ENOXAPARIN SODIUM 30 MG/0.3ML IJ SOSY
30.0000 mg | PREFILLED_SYRINGE | INTRAMUSCULAR | Status: DC
  Administered 2024-05-22 – 2024-05-27 (×9): 30 mg via SUBCUTANEOUS
  Filled 2024-05-22 (×6): qty 0.3

## 2024-05-22 MED ORDER — MECLIZINE HCL 12.5 MG PO TABS: 12.5000 mg | ORAL_TABLET | Freq: Three times a day (TID) | ORAL | Status: DC | PRN

## 2024-05-22 MED ORDER — ACETAMINOPHEN 325 MG PO TABS
650.0000 mg | ORAL_TABLET | Freq: Four times a day (QID) | ORAL | Status: DC | PRN
  Administered 2024-05-23 – 2024-05-27 (×4): 650 mg via ORAL
  Filled 2024-05-22 (×3): qty 2

## 2024-05-22 MED ORDER — SODIUM CHLORIDE 0.9 % IV SOLN
2.0000 g | INTRAVENOUS | Status: DC
  Administered 2024-05-23 – 2024-05-27 (×8): 2 g via INTRAVENOUS
  Filled 2024-05-22 (×5): qty 12.5

## 2024-05-22 MED ORDER — BUDESON-GLYCOPYRROL-FORMOTEROL 160-9-4.8 MCG/ACT IN AERO
2.0000 | INHALATION_SPRAY | Freq: Two times a day (BID) | RESPIRATORY_TRACT | Status: DC
  Administered 2024-05-22 – 2024-05-28 (×18): 2 via RESPIRATORY_TRACT
  Filled 2024-05-22: qty 5.9

## 2024-05-22 MED ORDER — MUPIROCIN 2 % EX OINT
1.0000 | TOPICAL_OINTMENT | Freq: Two times a day (BID) | CUTANEOUS | Status: DC
  Administered 2024-05-22 – 2024-05-28 (×18): 1 via TOPICAL
  Filled 2024-05-22 (×3): qty 22

## 2024-05-22 MED ORDER — POLYSACCHARIDE IRON COMPLEX 150 MG PO CAPS
150.0000 mg | ORAL_CAPSULE | Freq: Every day | ORAL | Status: DC
  Administered 2024-05-22 – 2024-05-28 (×10): 150 mg via ORAL
  Filled 2024-05-22 (×8): qty 1

## 2024-05-22 MED ORDER — VANCOMYCIN HCL IN DEXTROSE 1-5 GM/200ML-% IV SOLN: 1000.0000 mg | Freq: Once | INTRAVENOUS | Status: DC

## 2024-05-22 MED ORDER — CLOPIDOGREL BISULFATE 75 MG PO TABS
75.0000 mg | ORAL_TABLET | Freq: Every day | ORAL | Status: DC
  Administered 2024-05-22 – 2024-05-28 (×10): 75 mg via ORAL
  Filled 2024-05-22 (×7): qty 1

## 2024-05-22 MED ORDER — SODIUM CHLORIDE 0.9 % IV SOLN
2.0000 g | Freq: Once | INTRAVENOUS | Status: AC
  Administered 2024-05-22: 2 g via INTRAVENOUS
  Filled 2024-05-22: qty 12.5

## 2024-05-22 NOTE — ED Triage Notes (Signed)
 Pt BIB [facility] from [facility] due to difficulty breathing.  Pt was diagnosed with pneumonia two weeks ago and took antibiotics for that long and finished course.  Pt reports that 2 days ago it came back with some blood in the sputum.  Chest xray in urgent care revealed right sided pneumonia.  Pt saturation was 83%RA. VS BP 155/56, HR 80, Spo2 97% 10L NRB

## 2024-05-22 NOTE — Hospital Course (Addendum)
 Brief Narrative:  78 year old with history of HTN, CHF, COPD/asthma, chronic pancreatitis, DM 2, depression, diabetes with neuropathy, PVD recently diagnosed with pneumonia a week ago and completed cefdinir.  Continued to have shortness of breath therefore admitted to the hospital.  Chest x-ray showed concerns of multifocal pneumonia therefore started on broad-spectrum antibiotics.  Also required diuretics during the hospitalization due to some signs of volume overload.  VQ scan was negative for PE and lower extremity Dopplers were negative for DVT.  Hospital course was complicated by AKI but improved with hydration. Eventually patient's respiratory status started improving.  With ambulation still required 3 L nasal cannula therefore this was arranged with home health services.  Patient today is medically stable to be discharged on p.o. antibiotics and follow-up outpatient with PCP.   Assessment & Plan:  Principal Problem:   Multifocal pneumonia Active Problems:   CKD (chronic kidney disease) stage 4, GFR 15-29 ml/min (HCC)   Chronic combined systolic and diastolic CHF (congestive heart failure) (HCC)   Dyslipidemia   Chronic depression   Iron  deficiency anemia, unspecified    Asthma   Chronic pancreatitis (HCC)   Type 2 diabetes mellitus with diabetic peripheral angiopathy without gangrene (HCC)   PVD (peripheral vascular disease) (HCC)   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HCC)     Sepsis POA Multifocal pneumonia Acute hypoxic respiratory failure:  Recently completed antibiotics and failed outpatient treatment (  cefdinir) admitted with sepsis/multifocal pneumonia -remains hemodynamically stable leukocytosis resolved. Blood culture NGTD.  Urine antigen strep negative. MRSA PCR negative.  Leukocytosis have resolved. -Will transition patient to p.o. Augmentin  and doxycycline  to finish the course. -Ambulatory pulse ox showing hypoxia down to 78% on  RA, needing 3L  [place]  Hypomagnesemia - Repleted prior to discharge   D-dimer positive: VQ scan negative, bilateral lower extremity Dopplers negative for DVT   AKI on CKD stage 4 Metabolic acidosis: Creatinine peaked around 2.4, now stabilizing around baseline of 1.7  COPD Asthma: No wheezing, continue inhalers, Singulair .   Peripheral vascular disease Carotid artery disease Cont Plavix  and statin   Hypertension Chronic diastolic CHF, EF 44% Nonsustained V. tach Leg swelling worse from baseline Hypomagnesemia: Echo updated 8/20 EF normal 55-60, G1 DD RV size normal, MV-degenerative, AV not well-visualized.  Continue home Toprol , Lasix    Chronic knee pain and ambulatory dysfunction On Chronic norco: Continue hydrocodone  at lower dose. She chronically takes it As per daughter in law/son,  she is mostly bedbound by her choice but she is ambulatory  Cont PT OT    Chronic diarrhea Chronic pancreatitis: Cont lomotil  prn   Dyslipidemia cont Lipitor.   Chronic depression: Continue home doxepin , Remeron , Effexor    Chronic Rash/eczema: Appears generalized on extremities and back with excoriation, going on for 3 months. 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 off isolation.  Previous provider discussed with infectious disease   Seizure disorder: continue home Keppra    IDA/anemia of CKD: Hemoglobin around baseline 10.0  T2DM with diabetic peripheral angiopathy without gangrene: A1c 6.1.  Sliding scale and Accu-Cheks   Class I Obesity w/ Body mass index is 30.87 kg/m.: OSA: Will benefit with PCP follow-up, weight loss,healthy lifestyle.  Patient is noncompliant with CPAP as per home Patient is sedentary and not much mobile. She is high risk for readmission. She will benefit palliative care follow-up as outpatient  DVT prophylaxis: enoxaparin  (LOVENOX ) injection 30 mg  Start:  05/22/24 2000 SCDs Start: 05/22/24 1814 Code Status:   Code Status: Full Code Family Communication:Called Lyn[place], son. Level of care: Telemetry Medical  Discharge today  PT Follow up Recs: Home Health Pt8/09/2024 1324.  Face to face completed  Subjective:  Doing ok, wishing to go home.  Denies any shortness of breath  Examination:  General exam: Appears calm and comfortable  Respiratory system: Clear to auscultation. Respiratory effort normal. Cardiovascular system: S1 & S2 heard, RRR. No JVD, murmurs, rubs, gallops or clicks. No pedal edema. Gastrointestinal system: Abdomen is nondistended, soft and nontender. No organomegaly or masses felt. Normal bowel sounds heard. Central nervous system: Alert and oriented. No focal neurological deficits. Extremities: Symmetric 5 x 5 power. Skin: No rashes, lesions or ulcers Psychiatry: Judgement and insight appear normal. Mood & affect appropriate.

## 2024-05-22 NOTE — ED Triage Notes (Signed)
 Pt st's she was dx with pneumonia approx 2 weeks ago  and was put on antibiotics  St's she started feeling short of breath yesterday an coughing up blood

## 2024-05-22 NOTE — ED Notes (Signed)
 [facility] notified of need for transport to the ED for hypoxia.

## 2024-05-22 NOTE — H&P (Signed)
 History and Physical    [name] FMW:[id number] DOB: Feb 13, 1946 DOA: 05/22/2024  PCP: Yolande Toribio MATSU, MD   Patient coming from: Home, lives alone  Chief complaints: Shortness of breath     HPI: [name] 78 year old female with hypertension, chronic combined systolic and diastolic CHF, asthma/COPD, anemia, chronic pancreatitis, depression, type 2 diabetes with neuropathy, PVD with recently diagnosed pneumonia a week ago, and has completed antibiotics (cefdinir) presenting with worsening shortness of breath, coughing and blood-tinged sputum.  As per ED patient's son also reports patient has been little confused over the last few days. Currently she is alert awaek oriented to place people  and date- Saturday. She says she has not been much mobile lately. In the ED: Vitals stable, on room air, afebrile labs with mild hyponatremia creatinine 2.4 BUN 31 normal LFTs lactic acid normal 1.4 labs with a leukocytosis 36.6 hemoglobin 10.3 COVID influenza RSV negative. Chest x-ray 2 views-multifocal pneumonia right lung, aortic atherosclerosis and emphysema. Blood culture sent, given vancomycin  cefepime  and morphine , admission requested.  Patient has been hypoxic admitted due to needing supplemental oxygen in the ED as per ED. VBG- PCO2 35, CO2 20, desaturates in mid 80-s and needing supplemental oxygen. BNP pending  She c/o leg swellinG worse from baseline her normaL weight in around 153lb, weight is pending in ED. Currently patient otherwise denies any nausea, vomiting, chest pain, fever, chills, headache, focal weakness, numbness tingling, speech difficulties. She does c/o chest pain this am while getting up to go to bathroom, and she had blood tinged sputum.    Assessment and plan:  Sepsis POA Multifocal pneumonia Acute hypoxic respiratory failure:  Recently completed antibiotics.  On presentation respiration 25 leukocytosis and pneumonia meeting sepsis criteria but vitals otherwise  stable, lactic acid normal.  Continue broad spectrum antibiotics and follow-up sputum culture blood culture urinary antigens. Since she had hemoptysis and hypoxia will check D dimer- but unable to get CTA as she has CKD, can coinsider VQ scan. Will check TTE.  AKI on CKD stage 4: B/L Creat ~ 2.1 09/2023, now at  2,41, progression of CKD versus AKI.  Monitor closely minimize nephrotoxic medication  COPD Asthma: Continue inhalers, Singulair .  Carotid artery disease PVD : Resume home Plavix  and statin  Hypertension Chronic diastolic CHF Chronic leg edema worse from baseline: BP stable. Last echo-EF 60-65% with G1 DD on 06/13/2023.  Follow-up BNP caution with IV fluids PTA on amlodipine ,, Lasix  which we will hold, resume home Toprol , Imdur   Chronic diarrhea: Resume lomotil  prn  Dyslipidemia Resume Lipitor.  Chronic depression: Continue home doxepin , Remeron , Effexor   Chronic pancreatitis Resume enzymes  Seizure disorder: continue home Keppra   IDA/anemia of CKD: Hemoglobin at baseline 10 to 11 g.  Continue home iron  supplement monitor.  T2DM with diabetic peripheral angiopathy without gangrene: Check A1c add sliding scale insulin   Severity of Illness: The appropriate patient status for this patient is INPATIENT. Inpatient status is judged to be reasonable and necessary in order to provide the required intensity of service to ensure the patient's safety. The patient's presenting symptoms, physical exam findings, and initial radiographic and laboratory data in the context of their chronic comorbidities is felt to place them at high risk for further clinical deterioration. Furthermore, it is not anticipated that the patient will be medically stable for discharge from the hospital within 2 midnights of admission.   * I certify that at the point of admission it is my clinical judgment that the patient will require  inpatient hospital care spanning beyond 2 midnights from the point of  admission due to high intensity of service, high risk for further deterioration and high frequency of surveillance required.*   DVT prophylaxis: enoxaparin  (LOVENOX ) injection 30 mg Start: 05/22/24 1815 SCDs Start: 05/22/24 1814  Code Status:   Code Status: Full Code  Family Communication: Admission, patients condition and plan of care including tests being ordered have been discussed with the patient  who indicate understanding and agree with the plan and Code Status.  Consults called:  none  Review of Systems: All systems were reviewed and were negative except as mentioned in HPI above. Negative for fever Negative for focal weakness Negative for vomiting  Past Medical History:  Diagnosis Date   Anxiety    Arthritis    back    Blind loop syndrome    Clotting disorder (HCC)    Hx Clot in leg per pt    Colostomy present [facility])    Coronary artery disease    a. known 60-70% mid-LAD stenosis with caths in 1999, 2002, 2006, and 2012 showing stable anatomy b. 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HCC)    Diabetes mellitus    Diverticulosis    Dizziness    chronic   Dyslipidemia    Fatty liver    GERD (gastroesophageal reflux disease)    hiatial hernia   Heart murmur    Hiatal hernia    Hypertension    Irritable bowel syndrome    Pancreatitis, chronic (HCC)    Peripheral vascular disease (HCC) 02/20/2010   s/p stent of left SFA; carotid stenosis   Seizure (HCC) 11/04/2018   last 1 yr 2020 per pt unsure month    Sleep apnea    no cpap    Stroke (HCC)    Thyroid  nodule    Vitamin B12 deficiency     Past Surgical History:  Procedure Laterality Date   ABDOMINAL HYSTERECTOMY     partial   AUGMENTATION MAMMAPLASTY     bilateral 1976 retro    BREAST ENHANCEMENT SURGERY     CARDIAC CATHETERIZATION  07/07/98;08/31/01;03/04/05   60 -70% LAD   CATARACT EXTRACTION Bilateral    COLON RESECTION N/A 10/14/2017   Procedure: EXPLORATORY LAPAROTOMY, EXTENDED  RIGHT COLECTOMY; APPLICATION OF ABDOMINAL VACUUM DRESSING;  Surgeon: Curvin Deward MOULD, MD;  Location: [facility] ORS;  Service: General;  Laterality: N/A;   COLONOSCOPY     CORONARY ATHERECTOMY N/A 02/12/2023   Procedure: CORONARY ATHERECTOMY;  Surgeon: Swaziland, Peter M, MD;  Location: [facility] INVASIVE CV LAB;  Service: Cardiovascular;  Laterality: N/A;   CORONARY STENT INTERVENTION N/A 02/12/2023   Procedure: CORONARY STENT INTERVENTION;  Surgeon: [place], Peter M, MD;  Location[facility] INVASIVE CV LAB;  Service: Cardiovascular;  Laterality: N/A;   CORONARY ULTRASOUND/IVUS N/A 02/12/2023   Procedure: Coronary Ultrasound/IVUS;  Surgeon: [place]il[place], Peter M, MD;  Location: [facility] INVASIVE CV LAB;  Service: Cardiovascular;  Laterality: N/A;   ENDOBRONCHIAL ULTRASOUND Bilateral 02/24/2017   Procedure: ENDOBRONCHIAL ULTRASOUND;  Surgeon: Shelah Lamar RAMAN, MD;  Location: [facility] ENDOSCOPY;  Service: Cardiopulmonary;  Laterality: Bilateral;   FEMORAL ARTERY STENT     Left leg   LAPAROTOMY N/A 10/16/2017   Procedure: EXPLORATORY LAPAROTOMY, PARTIAL OMENTECTOMY, RESECTION ISCHEMIC ILEUM 130CM, APPLICATION OF VAC ABDOMINAL DRESSING;  Surgeon: Eletha Boas, MD;  Location: [facility] ORS;  Service: General;  Laterality: N/A;   LAPAROTOMY N/A 10/18/2017   Procedure: RE-EXPLORATION OF ABDOMEN, ILEOSTOMY CREATION;  Surgeon: Aron Shoulders,  MD;  Location: [facility] ORS;  Service: General;  Laterality: N/A;   LEFT HEART CATH AND CORONARY ANGIOGRAPHY N/A 01/29/2023   Procedure: LEFT HEART CATH AND CORONARY ANGIOGRAPHY;  Surgeon: [place]azil[place], Peter M, MD;  Location: [facility] INVASIVE CV LAB;  Service: Cardiovascular;  Laterality: N/A;   LUNG SURGERY  03/2017   Benign polyps removed   PATELLA REALIGNMENT Left    POLYPECTOMY     SIGMOIDOSCOPY     TONSILLECTOMY     TRANSCAROTID ARTERY REVASCULARIZATION  Right 04/18/2022   Procedure: Transcarotid Artery Revascularization;  Surgeon: Lanis Fonda BRAVO, MD;  Location: [facility] OR;  Service: Vascular;  Laterality: Right;     reports that  she quit smoking about 13 years ago. Her smoking use included cigarettes. She started smoking about 63 years ago. She has a 50 pack-year smoking history. She has never used smokeless tobacco. She reports that she does not drink alcohol  and does not use drugs.  Allergies  Allergen Reactions   Adhesive [Tape] Other (See Comments)    Took off my skin   Other Rash    Took off my skin   Ace Inhibitors Other (See Comments)    Unknown   Clonidine      Resulted in excessive sleepiness/somnolence   Sulfa Antibiotics Other (See Comments)    Unknown   Topiramate     unknown   Codeine Other (See Comments)    Altered mental state    Iodinated Contrast Media Rash   Latex Rash    Family History  Problem Relation Age of Onset   Hypertension Mother    Osteoarthritis Mother    Pulmonary embolism Mother    Hypertension Father        aneurysm   Stroke Father    Colon cancer Paternal Grandfather    Stroke Other    Breast cancer Neg Hx    Colon polyps Neg Hx    Esophageal cancer Neg Hx    Rectal cancer Neg Hx    Stomach cancer Neg Hx    Pancreatic cancer Neg Hx    Liver disease Neg Hx      Prior to Admission medications   Medication Sig Start Date End Date Taking? Authorizing Provider  albuterol  (VENTOLIN  HFA) 108 (90 Base) MCG/ACT inhaler Inhale 1-2 puffs into the lungs every 6 (six) hours as needed for wheezing or shortness of breath. 03/30/21   [provider]  amLODipine  (NORVASC ) 10 MG tablet Take 1 tablet (10 mg total) by mouth daily. 04/20/23   Dennise Lavada POUR, MD  atorvastatin  (LIPITOR) 40 MG tablet Take 1 tablet (40 mg total) by mouth daily. 04/19/22   Bryn Bernardino NOVAK, MD  clopidogrel  (PLAVIX ) 75 MG tablet Take 1 tablet (75 mg total) by mouth daily. 04/19/22   Bryn Bernardino NOVAK, MD  diltiazem  2 % GEL Add 5% lidocaine / Apply to rectum three times a day for 6-8 weeks 10/03/23   Mollie Pfeiffer M, PA-C  diphenoxylate -atropine  (LOMOTIL ) 2.5-0.025 MG tablet Take 1 tablet by mouth 4  (four) times daily as needed. 01/06/24   [provider]  doxepin  (SINEQUAN ) 10 MG/ML solution Take by mouth at bedtime.    [provider]  feeding supplement (ENSURE ENLIVE / ENSURE PLUS) LIQD Take 237 mLs by mouth 2 (two) times daily between meals. 09/03/23   [provider]  fluticasone  (FLONASE ) 50 MCG/ACT nasal spray Place 2 sprays into both nostrils daily as needed for allergies. 01/29/16   [provider]  furosemide  (LASIX ) 20 MG  tablet Take 1 tablet (20 mg total) by mouth daily as needed. 02/13/23   Williams, Evan, PA-C  HYDROcodone -acetaminophen  (NORCO) 10-325 MG tablet Take 1 tablet by mouth 4 (four) times daily as needed.    [provider]  isosorbide  mononitrate (IMDUR ) 60 MG 24 hr tablet TAKE 1 TABLET BY MOUTH EVERY DAY 04/28/23   [place], Peter M, MD  levETIRAcetam  (KEPPRA ) 500 MG tablet TAKE 1 TABLET BY MOUTH TWICE A DAY 01/20/20   Dohmeier, Dedra, MD  Lidocaine  HCl (ASPERCREME LIDOCAINE ) 4 % CREA 1 Application. 12/12/23   [provider]  meclizine  (ANTIVERT ) 25 MG tablet Take 0.5 tablets (12.5 mg total) by mouth 3 (three) times daily as needed for dizziness. 01/04/23   Singh, Prashant K, MD  metoprolol  succinate (TOPROL -XL) 100 MG 24 hr tablet Take 100 mg by mouth daily. 12/20/21   [provider]  mirtazapine  (REMERON ) 15 MG tablet Take 15 mg by mouth at bedtime. 07/18/21   [provider]  montelukast  (SINGULAIR ) 10 MG tablet Take 10 mg by mouth daily. 08/09/18   [provider]  mupirocin  ointment (BACTROBAN ) 2 % Apply 1 Application topically 2 (two) times daily. 04/28/24   [provider]  nitroGLYCERIN  (NITROSTAT ) 0.4 MG SL tablet Place 1 tablet (0.4 mg total) under the tongue every 5 (five) minutes as needed for chest pain. 01/23/23   [place], Peter M, MD  nystatin-triamcinolone  (MYCOLOG II) cream Apply 1 Application topically 2 (two) times daily. 04/28/24   [provider]  ondansetron   (ZOFRAN ) 4 MG tablet Take 1 tablet (4 mg total) by mouth every 6 (six) hours as needed for nausea or vomiting. 10/24/21   Esterwood, Amy S, PA-C  Pancrelipase , Lip-Prot-Amyl, (ZENPEP ) 40000-126000 units CPEP Take 2 capsules with meals and one capsule with snacks. 06/08/21   Aneita Gwendlyn DASEN, MD  pantoprazole  (PROTONIX ) 40 MG tablet Take 1 tablet (40 mg total) by mouth 2 (two) times daily. 01/31/22   Aneita Gwendlyn DASEN, MD  Polysaccharide-Iron  Complex 150 MG CAPS Take 1 tablet by mouth daily.    [provider]  venlafaxine  XR (EFFEXOR -XR) 75 MG 24 hr capsule Take 1 capsule (75 mg total) by mouth daily. 05/18/20   [provider]  vitamin B-12 (CYANOCOBALAMIN ) 500 MCG tablet Take 500 mcg by mouth daily.    [provider]  Vitamin D , Ergocalciferol , (DRISDOL ) 1.25 MG (50000 UT) CAPS capsule Take 50,000 Units by mouth every Wednesday.     [provider]    Physical Exam: Vitals:   05/22/24 1554 05/22/24 1555 05/22/24 1715 05/22/24 1815  BP: (!) 139/57 (!) 139/57  (!) 145/48  Pulse: 89 (!) 26 88 86  Resp: 19 (!) 25 17 16   Temp: 98.4 F (36.9 C)     TempSrc: Oral     SpO2: 92% 98% 100% 97%    General exam: AAO , NAD, weak appearing. HEENT:Oral mucosa moist, Ear/Nose WNL grossly, dentition normal. Respiratory system: bilaterally crackles,no wheezing,no use of accessory muscle Cardiovascular system: S1 & S2 +, No JVD,. Gastrointestinal system: Abdomen soft, NT,ND, BS+ Nervous System:Alert, awake, moving extremities and grossly nonfocal Extremities: ++  edema, distal peripheral pulses palpable.  Skin: No rashes,no icterus. MSK: Normal muscle bulk,tone, power   Labs on Admission: I have personally reviewed following labs and imaging studies  CBC: Recent Labs  Lab 05/19/24 1110 05/22/24 1604 05/22/24 1611  WBC 10.3 36.6*  --   NEUTROABS 7.1 30.7*  --   HGB 11.2* 10.3* 11.2*  HCT 34.7*  32.2* 33.0*  MCV 87.6 88.5  --   PLT 267.0 236  --    Basic  Metabolic Panel: Recent Labs  Lab 05/22/24 1604 05/22/24 1611  NA 132* 134*  K 3.8 3.8  CL 100  --   CO2 20*  --   GLUCOSE 104*  --   BUN 31*  --   CREATININE 2.41*  --   CALCIUM  8.2*  --    Estimated Creatinine Clearance: 17.7 mL/min (A) (by C-G formula based on SCr of 2.41 mg/dL (H)). Recent Labs  Lab 05/22/24 1604  AST 36  ALT 15  ALKPHOS 105  BILITOT 1.0  PROT 5.9*  ALBUMIN  2.5*   Urine analysis:    Component Value Date/Time   COLORURINE AMBER (A) 04/15/2023 2010   APPEARANCEUR CLOUDY (A) 04/15/2023 2010   LABSPEC 1.017 04/15/2023 2010   PHURINE 5.0 04/15/2023 2010   GLUCOSEU NEGATIVE 04/15/2023 2010   HGBUR LARGE (A) 04/15/2023 2010   BILIRUBINUR NEGATIVE 04/15/2023 2010   KETONESUR NEGATIVE 04/15/2023 2010   PROTEINUR 100 (A) 04/15/2023 2010   NITRITE NEGATIVE 04/15/2023 2010   LEUKOCYTESUR LARGE (A) 04/15/2023 2010    Radiological Exams on Admission: DG Chest 2 View Result Date: 05/22/2024 CLINICAL DATA:  SOB; hypoxia EXAM: CHEST - 2 VIEW COMPARISON:  Chest x-ray 06/09/2023, CT chest 06/04/2023 FINDINGS: The heart and mediastinal contours are within normal limits. Atherosclerotic plaque. Persistent coarsened interstitial markings with interval double low bone of right lung, lower lobe predominant, airspace opacity. No pleural effusion. No pneumothorax. No acute osseous abnormality. IMPRESSION: 1. Multifocal pneumonia of the right lung. Followup PA and lateral chest X-ray is recommended in 3-4 weeks following therapy to ensure resolution and exclude underlying malignancy. 2. Aortic Atherosclerosis (ICD10-I70.0) and Emphysema (ICD10-J43.9). Electronically Signed   By: Morgane  Naveau M.D.   On: 05/22/2024 15:44    Mennie LAMY MD [facility]  If 7PM-7AM, please contact night-coverage [email or web address]  05/22/2024, 6:19 PM

## 2024-05-22 NOTE — ED Provider Notes (Signed)
 [place] EMERGENCY DEPARTMENT AT [facility] Provider Note   CSN: [id number] Arrival date & time: 05/22/24  1550     Patient presents with: No chief complaint on file.   [name] is a 78 y.o. female history of hypertension, here presenting with shortness of breath and hypoxia.  Patient states that she just finished a course of antibiotics for pneumonia.  She cannot tell me what it was.  Patient went to urgent care today due to shortness of breath.  She also states that she has been coughing up some blood-tinged sputum.  She was noted to be hypoxic with oxygen in the low 80s.  Patient was put on a nonrebreather.  Patient also had a x-ray done at urgent care that showed multifocal pneumonia.  Patient was sent here for IV antibiotics and admission.  Patient does not use oxygen at baseline   HPI     Prior to Admission medications   Medication Sig Start Date End Date Taking? Authorizing Provider  albuterol  (VENTOLIN  HFA) 108 (90 Base) MCG/ACT inhaler Inhale 1-2 puffs into the lungs every 6 (six) hours as needed for wheezing or shortness of breath. 03/30/21   [provider]  amLODipine  (NORVASC ) 10 MG tablet Take 1 tablet (10 mg total) by mouth daily. 04/20/23   Singh, Prashant K, MD  atorvastatin  (LIPITOR) 40 MG tablet Take 1 tablet (40 mg total) by mouth daily. 04/19/22   Bryn Bernardino NOVAK, MD  cefdinir (OMNICEF) 300 MG capsule Take 300 mg by mouth 2 (two) times daily. 05/05/24   [provider]  clopidogrel  (PLAVIX ) 75 MG tablet Take 1 tablet (75 mg total) by mouth daily. 04/19/22   Bryn Bernardino NOVAK, MD  diltiazem  2 % GEL Add 5% lidocaine / Apply to rectum three times a day for 6-8 weeks 10/03/23   Mollie Pfeiffer M, PA-C  diphenoxylate -atropine  (LOMOTIL ) 2.5-0.025 MG tablet Take 1 tablet by mouth 4 (four) times daily as needed. 01/06/24   [provider]  doxepin  (SINEQUAN ) 10 MG/ML solution Take by mouth at bedtime.    [provider]  feeding  supplement (ENSURE ENLIVE / ENSURE PLUS) LIQD Take 237 mLs by mouth 2 (two) times daily between meals. 09/03/23   [provider]  fluticasone  (FLONASE ) 50 MCG/ACT nasal spray Place 2 sprays into both nostrils daily as needed for allergies. 01/29/16   [provider]  furosemide  (LASIX ) 20 MG tablet Take 1 tablet (20 mg total) by mouth daily as needed. 02/13/23   Williams, Evan, PA-C  HYDROcodone -acetaminophen  (NORCO) 10-325 MG tablet Take 1 tablet by mouth 4 (four) times daily as needed.    [provider]  isosorbide  mononitrate (IMDUR ) 60 MG 24 hr tablet TAKE 1 TABLET BY MOUTH EVERY DAY 04/28/23   [place], Peter M, MD  levETIRAcetam  (KEPPRA ) 500 MG tablet TAKE 1 TABLET BY MOUTH TWICE A DAY 01/20/20   Dohmeier, Dedra, MD  Lidocaine  HCl (ASPERCREME LIDOCAINE ) 4 % CREA 1 Application. 12/12/23   [provider]  meclizine  (ANTIVERT ) 25 MG tablet Take 0.5 tablets (12.5 mg total) by mouth 3 (three) times daily as needed for dizziness. 01/04/23   Singh, Prashant K, MD  metoprolol  succinate (TOPROL -XL) 100 MG 24 hr tablet Take 100 mg by mouth daily. 12/20/21   [provider]  mirtazapine  (REMERON ) 15 MG tablet Take 15 mg by mouth at bedtime. 07/18/21   [provider]  montelukast  (SINGULAIR ) 10 MG tablet Take 10 mg by mouth daily. 08/09/18   [provider]  mupirocin  ointment (BACTROBAN ) 2 % Apply 1 Application topically 2 (two) times daily. 04/28/24   [provider]  nitroGLYCERIN  (NITROSTAT ) 0.4 MG SL tablet Place 1 tablet (0.4 mg total) under the tongue every 5 (five) minutes as needed for chest pain. 01/23/23   [place], Peter M, MD  nystatin-triamcinolone  (MYCOLOG II) cream Apply 1 Application topically 2 (two) times daily. 04/28/24   [provider]  ondansetron  (ZOFRAN ) 4 MG tablet Take 1 tablet (4 mg total) by mouth every 6 (six) hours as needed for nausea or vomiting. 10/24/21   Esterwood, Amy S, PA-C  Pancrelipase , Lip-Prot-Amyl,  (ZENPEP ) 40000-126000 units CPEP Take 2 capsules with meals and one capsule with snacks. 06/08/21   Aneita Gwendlyn DASEN, MD  pantoprazole  (PROTONIX ) 40 MG tablet Take 1 tablet (40 mg total) by mouth 2 (two) times daily. 01/31/22   Aneita Gwendlyn DASEN, MD  Polysaccharide-Iron  Complex 150 MG CAPS Take 1 tablet by mouth daily.    [provider]  venlafaxine  XR (EFFEXOR -XR) 75 MG 24 hr capsule Take 1 capsule (75 mg total) by mouth daily. 05/18/20   [provider]  vitamin B-12 (CYANOCOBALAMIN ) 500 MCG tablet Take 500 mcg by mouth daily.    [provider]  Vitamin D , Ergocalciferol , (DRISDOL ) 1.25 MG (50000 UT) CAPS capsule Take 50,000 Units by mouth every Wednesday.     [provider]    Allergies: Adhesive [tape], Other, Ace inhibitors, Clonidine , Sulfa antibiotics, Topiramate, Codeine, Iodinated contrast media, and Latex    Review of Systems  Respiratory:  Positive for cough and shortness of breath.   All other systems reviewed and are negative.   Updated Vital Signs BP (!) 139/57   Pulse 88   Temp 98.4 F (36.9 C) (Oral)   Resp 17   SpO2 100%   Physical Exam Vitals and nursing note reviewed.  Constitutional:      Appearance: Normal appearance.  HENT:     Head: Normocephalic.     Nose: Nose normal.     Mouth/Throat:     Mouth: Mucous membranes are moist.  Eyes:     Pupils: Pupils are equal, round, and reactive to light.  Cardiovascular:     Rate and Rhythm: Normal rate and regular rhythm.     Pulses: Normal pulses.     Heart sounds: Normal heart sounds.  Pulmonary:     Comments: Crackles bilateral bases Abdominal:     General: Abdomen is flat.     Palpations: Abdomen is soft.  Musculoskeletal:        General: Normal range of motion.     Cervical back: Normal range of motion and neck supple.  Skin:    General: Skin is warm.     Capillary Refill: Capillary refill takes less than 2 seconds.  Neurological:     General: No focal deficit  present.     Mental Status: She is alert and oriented to person, place, and time.  Psychiatric:        Mood and Affect: Mood normal.        Behavior: Behavior normal.     (all labs ordered are listed, but only abnormal results are displayed) Labs Reviewed  CBC WITH DIFFERENTIAL/PLATELET - Abnormal; Notable for the following components:      Result Value   WBC 36.6 (*)    RBC 3.64 (*)    Hemoglobin 10.3 (*)    HCT 32.2 (*)    Neutro Abs 30.7 (*)  Monocytes Absolute 1.6 (*)    Abs Immature Granulocytes 2.51 (*)    All other components within normal limits  COMPREHENSIVE METABOLIC PANEL WITH GFR - Abnormal; Notable for the following components:   Sodium 132 (*)    CO2 20 (*)    Glucose, Bld 104 (*)    BUN 31 (*)    Creatinine, Ser 2.41 (*)    Calcium  8.2 (*)    Total Protein 5.9 (*)    Albumin  2.5 (*)    GFR, Estimated 20 (*)    All other components within normal limits  I-STAT VENOUS BLOOD GAS, ED - Abnormal; Notable for the following components:   pCO2, Ven 35.2 (*)    pO2, Ven 24 (*)    Bicarbonate 19.1 (*)    TCO2 20 (*)    Acid-base deficit 6.0 (*)    Sodium 134 (*)    Calcium , Ion 1.09 (*)    HCT 33.0 (*)    Hemoglobin 11.2 (*)    All other components within normal limits  RESP PANEL BY RT-PCR (RSV, FLU A&B, COVID)  RVPGX2  CULTURE, BLOOD (ROUTINE X 2)  CULTURE, BLOOD (ROUTINE X 2)  BRAIN NATRIURETIC PEPTIDE  I-STAT CG4 LACTIC ACID, ED  I-STAT CG4 LACTIC ACID, ED    EKG: EKG Interpretation Date/Time:  Saturday May 22 2024 15:55:38 EDT Ventricular Rate:  87 PR Interval:  139 QRS Duration:  106 QT Interval:  362 QTC Calculation: 436 R Axis:   58  Text Interpretation: Sinus rhythm Nonspecific repol abnormality, diffuse leads Baseline wander in lead(s) V3 No significant change since last tracing Confirmed by Patt Alm DEL [phone]) on 05/22/2024 5:31:14 PM  Radiology: DG Chest 2 View Result Date: 05/22/2024 CLINICAL DATA:  SOB; hypoxia EXAM: CHEST - 2  VIEW COMPARISON:  Chest x-ray 06/09/2023, CT chest 06/04/2023 FINDINGS: The heart and mediastinal contours are within normal limits. Atherosclerotic plaque. Persistent coarsened interstitial markings with interval double low bone of right lung, lower lobe predominant, airspace opacity. No pleural effusion. No pneumothorax. No acute osseous abnormality. IMPRESSION: 1. Multifocal pneumonia of the right lung. Followup PA and lateral chest X-ray is recommended in 3-4 weeks following therapy to ensure resolution and exclude underlying malignancy. 2. Aortic Atherosclerosis (ICD10-I70.0) and Emphysema (ICD10-J43.9). Electronically Signed   By: Morgane  Naveau M.D.   On: 05/22/2024 15:44    CRITICAL CARE Performed by: Alm DEL Patt   Total critical care time: 45 minutes  Critical care time was exclusive of separately billable procedures and treating other patients.  Critical care was necessary to treat or prevent imminent or life-threatening deterioration.  Critical care was time spent personally by me on the following activities: development of treatment plan with patient and/or surrogate as well as nursing, discussions with consultants, evaluation of patient's response to treatment, examination of patient, obtaining history from patient or surrogate, ordering and performing treatments and interventions, ordering and review of laboratory studies, ordering and review of radiographic studies, pulse oximetry and re-evaluation of patient's condition.    Procedures   Medications Ordered in the ED  morphine  (PF) 4 MG/ML injection 4 mg (has no administration in time range)  vancomycin  (VANCOCIN ) IVPB 1000 mg/200 mL premix (has no administration in time range)                                    Medical Decision Making ARIAHNA SMIDDY is a 78 y.o. female  here presenting with cough and shortness of breath.  Patient just finished a course of antibiotics and outpatient chest x-ray today showed multifocal  pneumonia.  Plan to do sepsis workup with CBC and CMP and lactate and cultures.  Will likely need IV antibiotics  5:33 PM His white blood cell count is 36,000.  COVID and flu RSV unremarkable.  VBG reassuring.  Will admit for multifocal pneumonia with hypoxia   Problems Addressed: Community acquired pneumonia, unspecified laterality: acute illness or injury Hypoxia: acute illness or injury  Amount and/or Complexity of Data Reviewed Labs: ordered. Decision-making details documented in ED Course. Radiology: ordered and independent interpretation performed. Decision-making details documented in ED Course.  Risk Prescription drug management.    Final diagnoses:  None    ED Discharge Orders     None          Patt Alm Macho, MD 05/22/24 1734

## 2024-05-22 NOTE — ED Notes (Signed)
 Patient is being discharged from the Urgent Care and sent to the Emergency Department via POV . Per Dr Rolinda, patient is in need of higher level of care due to hypoxia, limited resources. Patient is aware and verbalizes understanding of plan of care.  Vitals:   05/22/24 1500 05/22/24 1509  Pulse:    Resp:    Temp:    SpO2: (!) 79% (!) 63%

## 2024-05-22 NOTE — ED Provider Notes (Signed)
 [facility]   [id number] 05/22/24 Arrival Time: 1348  ASSESSMENT & PLAN:  1. Hypoxia   2. SOB (shortness of breath)    I have personally viewed and independently interpreted the imaging studies ordered this visit. CXR: multifocal PNA.  Pt presented with a few days of worsening SOB. Extensive PMH. Reports being dx with PNA one week ago and has completed an antibiotic (unsure of name); no imaging available from her PCP. Denies assoc CP. Upon arrival SpO2 in the low 80's on RA. Placed on 10L O2 NRB with resulting sats in the low to mid 90's. Distant smoking history but for many decades. No prev dx of COPD. Does not use home O2. Denies fever.  Pulse 88   Temp 98.1 F (36.7 C) (Oral)   Resp (!) 24   SpO2 93%  Discharge VS above. To ED via [facility]. Stable upon discharge.   No orders of the defined types were placed in this encounter.   Chest pain precautions given. Reviewed expectations re: course of current medical issues. Questions answered. Outlined signs and symptoms indicating need for more acute intervention. Patient verbalized understanding. After Visit Summary given.   SUBJECTIVE:  History from: patient. [name] is a 78 y.o. female who presents with complaint of feeling SOB; reports being dx with PNA last week; has finished antibiotics. SOB worse today. Is coughing and reports blood-tinged sputum. Denies assoc n/v/CP. Son brought her here. He reports she has seemed a little confused over the past few days.  Social History   Tobacco Use  Smoking Status Former   Current packs/day: 0.00   Average packs/day: 1 pack/day for 50.0 years (50.0 ttl pk-yrs)   Types: Cigarettes   Start date: 10/14/1960   Quit date: 10/14/2010   Years since quitting: 13.6  Smokeless Tobacco Never   Social History   Substance and Sexual Activity  Alcohol  Use No     OBJECTIVE:  Vitals:   05/22/24 1500 05/22/24 1509 05/22/24 1511 05/22/24 1519  Pulse:   95 88  Resp:       Temp:      TempSrc:      SpO2: (!) 79% (!) 63% (!) 88% 93%    General appearance: in resp distress Eyes: PERRLA; EOMI; conjunctivae normal HENT: normocephalic; atraumatic Neck: supple with FROM Lungs: with labored respirations; speaks full sentences without difficulty; dull breath sounds over R; no wheezing/rales appreciated Heart: regular rate and rhythm Abdomen: soft, non-tender; no guarding or rebound tenderness Extremities: with 2+ edema (reports this is her baseline); without calf swelling or tenderness; symmetrical without gross deformities Skin: warm and dry; without rash or lesions Neuro: normal gait Psychological: alert and cooperative; normal mood and affect   Imaging: DG Chest 2 View Result Date: 05/22/2024 CLINICAL DATA:  SOB; hypoxia EXAM: CHEST - 2 VIEW COMPARISON:  Chest x-ray 06/09/2023, CT chest 06/04/2023 FINDINGS: The heart and mediastinal contours are within normal limits. Atherosclerotic plaque. Persistent coarsened interstitial markings with interval double low bone of right lung, lower lobe predominant, airspace opacity. No pleural effusion. No pneumothorax. No acute osseous abnormality. IMPRESSION: 1. Multifocal pneumonia of the right lung. Followup PA and lateral chest X-ray is recommended in 3-4 weeks following therapy to ensure resolution and exclude underlying malignancy. 2. Aortic Atherosclerosis (ICD10-I70.0) and Emphysema (ICD10-J43.9). Electronically Signed   By: Morgane  Naveau M.D.   On: 05/22/2024 15:44     Allergies  Allergen Reactions   Adhesive [Tape] Other (See Comments)    Took off my  skin   Other Rash    Took off my skin   Ace Inhibitors Other (See Comments)    Unknown   Clonidine      Resulted in excessive sleepiness/somnolence   Sulfa Antibiotics Other (See Comments)    Unknown   Topiramate     unknown   Codeine Other (See Comments)    Altered mental state    Iodinated Contrast Media Rash   Latex Rash    Past Medical History:   Diagnosis Date   Anxiety    Arthritis    back    Blind loop syndrome    Clotting disorder (HCC)    Hx Clot in leg per pt    Colostomy present (HCC)    Coronary artery disease    a. known 60-70% mid-LAD stenosis with caths in 1999, 2002, 2006, and 2012 showing stable anatomy b. 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HCC)    Diabetes mellitus    Diverticulosis    Dizziness    chronic   Dyslipidemia    Fatty liver    GERD (gastroesophageal reflux disease)    hiatial hernia   Heart murmur    Hiatal hernia    Hypertension    Irritable bowel syndrome    Pancreatitis, chronic (HCC)    Peripheral vascular disease (HCC) 02/20/2010   s/p stent of left SFA; carotid stenosis   Seizure (HCC) 11/04/2018   last 1 yr 2020 per pt unsure month    Sleep apnea    no cpap    Stroke (HCC)    Thyroid  nodule    Vitamin B12 deficiency    Social History   Socioeconomic History   Marital status: Divorced    Spouse name: Not on file   Number of children: 2   Years of education: 10   Highest education level: Not on file  Occupational History   Occupation: retired    Associate Professor: DISABLED  Tobacco Use   Smoking status: Former    Current packs/day: 0.00    Average packs/day: 1 pack/day for 50.0 years (50.0 ttl pk-yrs)    Types: Cigarettes    Start date: 10/14/1960    Quit date: 10/14/2010    Years since quitting: 13.6   Smokeless tobacco: Never  Vaping Use   Vaping status: Never Used  Substance and Sexual Activity   Alcohol  use: No   Drug use: No   Sexual activity: Not Currently    Comment: Hysterectomy  Other Topics Concern   Not on file  Social History Narrative   Right handed   Drinks caffein prn   One floor home   Social Drivers of Health   Financial Resource Strain: Not on file  Food Insecurity: No Food Insecurity (07/01/2023)   Hunger Vital Sign    Worried About Running Out of Food in the Last Year: Never true    Ran Out of Food in the Last Year: Never  true  Transportation Needs: No Transportation Needs (07/01/2023)   PRAPARE - Administrator, Civil Service (Medical): No    Lack of Transportation (Non-Medical): No  Physical Activity: Not on file  Stress: Not on file  Social Connections: Not on file  Intimate Partner Violence: Not At Risk (07/01/2023)   Humiliation, Afraid, Rape, and Kick questionnaire    Fear of Current or Ex-Partner: No    Emotionally Abused: No    Physically Abused: No    Sexually Abused: No  Family History  Problem Relation Age of Onset   Hypertension Mother    Osteoarthritis Mother    Pulmonary embolism Mother    Hypertension Father        aneurysm   Stroke Father    Colon cancer Paternal Grandfather    Stroke Other    Breast cancer Neg Hx    Colon polyps Neg Hx    Esophageal cancer Neg Hx    Rectal cancer Neg Hx    Stomach cancer Neg Hx    Pancreatic cancer Neg Hx    Liver disease Neg Hx    Past Surgical History:  Procedure Laterality Date   ABDOMINAL HYSTERECTOMY     partial   AUGMENTATION MAMMAPLASTY     bilateral 1976 retro    BREAST ENHANCEMENT SURGERY     CARDIAC CATHETERIZATION  07/07/98;08/31/01;03/04/05   60 -70% LAD   CATARACT EXTRACTION Bilateral    COLON RESECTION N/A 10/14/2017   Procedure: EXPLORATORY LAPAROTOMY, EXTENDED RIGHT COLECTOMY; APPLICATION OF ABDOMINAL VACUUM DRESSING;  Surgeon: Curvin Deward MOULD, MD;  Location: [facility] ORS;  Service: General;  Laterality: N/A;   COLONOSCOPY     CORONARY ATHERECTOMY N/A 02/12/2023   Procedure: CORONARY ATHERECTOMY;  Surgeon: [place]azil[place], Peter M, MD;  Location: [facility] INVASIVE CV LAB;  Service: Cardiovascular;  Laterality: N/A;   CORONARY STENT INTERVENTION N/A 02/12/2023   Procedure: CORONARY STENT INTERVENTION;  Surgeon: [place], Peter M, MD;  Location: [facility] INVASIVE CV LAB;  Service: Cardiovascular;  Laterality: N/A;   CORONARY ULTRASOUND/IVUS N/A 02/12/2023   Procedure: Coronary Ultrasound/IVUS;  Surgeon: [place], Peter M, MD;  Location: [facility] INVASIVE  CV LAB;  Service: Cardiovascular;  Laterality: N/A;   ENDOBRONCHIAL ULTRASOUND Bilateral 02/24/2017   Procedure: ENDOBRONCHIAL ULTRASOUND;  Surgeon: Shelah Lamar RAMAN, MD;  Location: [facility] ENDOSCOPY;  Service: Cardiopulmonary;  Laterality: Bilateral;   FEMORAL ARTERY STENT     Left leg   LAPAROTOMY N/A 10/16/2017   Procedure: EXPLORATORY LAPAROTOMY, PARTIAL OMENTECTOMY, RESECTION ISCHEMIC ILEUM 130CM, APPLICATION OF VAC ABDOMINAL DRESSING;  Surgeon: Eletha Boas, MD;  Location: [facility];  Service: General;  Laterality: N/A;   LAPAROTOMY N/A 10/18/2017   Procedure: RE-EXPLORATION OF ABDOMEN, ILEOSTOMY CREATION;  Surgeon: Aron Shoulders, MD;  Location: [facility];  Service: General;  Laterality: N/A;   LEFT HEART CATH AND CORONARY ANGIOGRAPHY N/A 01/29/2023   Procedure: LEFT HEART CATH AND CORONARY ANGIOGRAPHY;  Surgeon: [place]azil[place], Peter M, MD;  Location: [facility] INVASIVE CV LAB;  Service: Cardiovascular;  Laterality: N/A;   LUNG SURGERY  03/2017   Benign polyps removed   PATELLA REALIGNMENT Left    POLYPECTOMY     SIGMOIDOSCOPY     TONSILLECTOMY     TRANSCAROTID ARTERY REVASCULARIZATION  Right 04/18/2022   Procedure: Transcarotid Artery Revascularization;  Surgeon: Lanis Fonda BRAVO, MD;  Location: [facility] OR;  Service: Vascular;  Laterality: Right;      Rolinda Rogue, MD 05/22/24 [phone]

## 2024-05-22 NOTE — Progress Notes (Signed)
 Pharmacy Antibiotic Note  [name] is a 78 y.o. female admitted on 05/22/2024 sepsis, concern for multifocal pneumonia.  Pharmacy has been consulted for vancomycin  and cefepime  dosing.  Vancomycin  1g IV x 1 given in ED  Plan: Vancomycin  variable dosing d/t unstable renal function Cefepime  2g IV q 24h Monitor renal function, Cx/PCR to narrow Vancomycin  levels as indicated     Temp (24hrs), Avg:98.3 F (36.8 C), Min:98.1 F (36.7 C), Max:98.4 F (36.9 C)  Recent Labs  Lab 05/19/24 1110 05/22/24 1604 05/22/24 1611  WBC 10.3 36.6*  --   CREATININE  --  2.41*  --   LATICACIDVEN  --   --  1.4    Estimated Creatinine Clearance: 17.7 mL/min (A) (by C-G formula based on SCr of 2.41 mg/dL (H)).    Allergies  Allergen Reactions   Adhesive [Tape] Other (See Comments)    Took off my skin   Other Rash    Took off my skin   Ace Inhibitors Other (See Comments)    Unknown   Clonidine      Resulted in excessive sleepiness/somnolence   Sulfa Antibiotics Other (See Comments)    Unknown   Topiramate     unknown   Codeine Other (See Comments)    Altered mental state    Iodinated Contrast Media Rash   Latex Rash    Dorn Poot, PharmD, [facility] Clinical Pharmacist ED Pharmacist Phone # [phone] 05/22/2024 7:02 PM

## 2024-05-22 NOTE — ED Notes (Signed)
 [place], RN at [facility] ED notified of patient coming to the ED for hypoxia.

## 2024-05-22 NOTE — ED Notes (Signed)
 PT placed on 2L [place] for occasional desats to the 80's

## 2024-05-23 ENCOUNTER — Inpatient Hospital Stay (HOSPITAL_COMMUNITY)

## 2024-05-23 DIAGNOSIS — J189 Pneumonia, unspecified organism: Secondary | ICD-10-CM | POA: Diagnosis not present

## 2024-05-23 DIAGNOSIS — R609 Edema, unspecified: Secondary | ICD-10-CM

## 2024-05-23 DIAGNOSIS — I5032 Chronic diastolic (congestive) heart failure: Secondary | ICD-10-CM

## 2024-05-23 LAB — GLUCOSE, CAPILLARY
Glucose-Capillary: 121 mg/dL — ABNORMAL HIGH (ref 70–99)
Glucose-Capillary: 120 mg/dL — ABNORMAL HIGH (ref 70–99)
Glucose-Capillary: 86 mg/dL (ref 70–99)

## 2024-05-23 LAB — ECHOCARDIOGRAM COMPLETE
Area-P 1/2: 3.77 cm2
Calc EF: 64.6 %
Height: 61 in
S' Lateral: 3.4 cm
Single Plane A2C EF: 69.4 %
Single Plane A4C EF: 62.8 %
Weight: 2613.77 [oz_av]

## 2024-05-23 LAB — BLOOD GAS, VENOUS
Acid-base deficit: 5.2 mmol/L — ABNORMAL HIGH (ref 0.0–2.0)
Bicarbonate: 20 mmol/L (ref 20.0–28.0)
Drawn by: 73361
O2 Saturation: 87.4 %
Patient temperature: 36.7
pCO2, Ven: 37 mmHg — ABNORMAL LOW (ref 44–60)
pH, Ven: 7.34 (ref 7.25–7.43)
pO2, Ven: 56 mmHg — ABNORMAL HIGH (ref 32–45)

## 2024-05-23 LAB — BASIC METABOLIC PANEL WITH GFR
Anion gap: 15 (ref 5–15)
Anion gap: 11 (ref 5–15)
BUN: 36 mg/dL — ABNORMAL HIGH (ref 8–23)
BUN: 35 mg/dL — ABNORMAL HIGH (ref 8–23)
CO2: 14 mmol/L — ABNORMAL LOW (ref 22–32)
CO2: 22 mmol/L (ref 22–32)
Calcium: 8.1 mg/dL — ABNORMAL LOW (ref 8.9–10.3)
Calcium: 8.1 mg/dL — ABNORMAL LOW (ref 8.9–10.3)
Chloride: 105 mmol/L (ref 98–111)
Chloride: 104 mmol/L (ref 98–111)
Creatinine, Ser: 2.07 mg/dL — ABNORMAL HIGH (ref 0.44–1.00)
Creatinine, Ser: 2.27 mg/dL — ABNORMAL HIGH (ref 0.44–1.00)
GFR, Estimated: 24 mL/min — ABNORMAL LOW (ref >60)
GFR, Estimated: 22 mL/min — ABNORMAL LOW (ref >60)
Glucose, Bld: 93 mg/dL (ref 70–99)
Glucose, Bld: 112 mg/dL — ABNORMAL HIGH (ref 70–99)
Potassium: 4.4 mmol/L (ref 3.5–5.1)
Potassium: 3.8 mmol/L (ref 3.5–5.1)
Sodium: 134 mmol/L — ABNORMAL LOW (ref 135–145)
Sodium: 137 mmol/L (ref 135–145)

## 2024-05-23 LAB — CBC
HCT: 30.7 % — ABNORMAL LOW (ref 36.0–46.0)
Hemoglobin: 9.5 g/dL — ABNORMAL LOW (ref 12.0–15.0)
MCH: 28.3 pg (ref 26.0–34.0)
MCHC: 30.9 g/dL (ref 30.0–36.0)
MCV: 91.4 fL (ref 80.0–100.0)
Platelets: 197 K/uL (ref 150–400)
RBC: 3.36 MIL/uL — ABNORMAL LOW (ref 3.87–5.11)
RDW: 15.9 % — ABNORMAL HIGH (ref 11.5–15.5)
WBC: 32.8 K/uL — ABNORMAL HIGH (ref 4.0–10.5)
nRBC: 0 % (ref 0.0–0.2)

## 2024-05-23 LAB — MRSA NEXT GEN BY PCR, NASAL: MRSA by PCR Next Gen: NOT DETECTED

## 2024-05-23 MED ORDER — HYDROCODONE-ACETAMINOPHEN 5-325 MG PO TABS
1.0000 | ORAL_TABLET | Freq: Three times a day (TID) | ORAL | Status: DC | PRN
  Administered 2024-05-23 (×2): 1 via ORAL
  Filled 2024-05-23 (×2): qty 1

## 2024-05-23 MED ORDER — FUROSEMIDE 20 MG PO TABS
20.0000 mg | ORAL_TABLET | Freq: Every day | ORAL | Status: DC
  Administered 2024-05-23 – 2024-05-28 (×9): 20 mg via ORAL
  Filled 2024-05-23 (×6): qty 1

## 2024-05-23 MED ORDER — HYDROCORTISONE 1 % EX LOTN: TOPICAL_LOTION | Freq: Two times a day (BID) | CUTANEOUS | Status: DC | PRN

## 2024-05-23 MED ORDER — SODIUM BICARBONATE 650 MG PO TABS
1300.0000 mg | ORAL_TABLET | Freq: Two times a day (BID) | ORAL | Status: DC
  Administered 2024-05-23 – 2024-05-24 (×4): 1300 mg via ORAL
  Filled 2024-05-23 (×3): qty 2

## 2024-05-23 MED ORDER — TECHNETIUM TO 99M ALBUMIN AGGREGATED
4.3000 | Freq: Once | INTRAVENOUS | Status: AC | PRN
Start: 2024-05-23 — End: 2024-05-23
  Administered 2024-05-23: 4.3 via INTRAVENOUS

## 2024-05-23 MED ORDER — VANCOMYCIN HCL 1250 MG/250ML IV SOLN
1250.0000 mg | Freq: Once | INTRAVENOUS | Status: AC
  Administered 2024-05-23: 1250 mg via INTRAVENOUS
  Filled 2024-05-23: qty 250

## 2024-05-23 NOTE — Progress Notes (Signed)
 Patient arrived on the unit in wheelchair, A&O, vitals taken, assessment done, multiple abrasions and scratch marks. Denied pain, all questions and need addressed, bed in low position and call bell within reach. We will continue to monitor.

## 2024-05-23 NOTE — Progress Notes (Signed)
 PROGRESS NOTE [name]  FMW:[id number] DOB: Nov 24, 1945 DOA: 05/22/2024 PCP: Yolande Toribio MATSU, MD  Brief Narrative/Hospital Course: 78 year old female with hypertension, chronic combined systolic and diastolic CHF, asthma/COPD, anemia, chronic pancreatitis, depression, type 2 diabetes with neuropathy, PVD with recently diagnosed pneumonia a week ago, and has completed antibiotics (cefdinir) presenting with worsening shortness of breath, coughing and blood-tinged sputum.  As per ED patient's son also reports patient has been little confused over the last few days. In the ZI:Yzfnibwjfprjoob stable although mild hypoxic needing supplemental oxygen,  labs with mild hyponatremia creatinine 2.4 BUN 31 normal LFTs lactic acid normal 1.4 labs with a leukocytosis 36.6 hemoglobin 10.3 COVID influenza RSV negative. Cxr>multifocal pneumonia right lung, aortic atherosclerosis and emphysema. Blood culture sent, given vancomycin  cefepime  and morphine , admission requested. She c/o leg swelling worse from baseline her normaL weight in around 153lb, weight is pending in ED.  Subjective: Seen and examined today. Patient reports he feels better having some cough with mucus no hemoptysis Denies chest pain. Overnight vitals stable afebrile she is on nasal cannula at 3.5 L  Labs this morning bicarb down to 14 from 28 creatinine better at 2.0 CBC with improving WBC count 32.8 hemoglobin 9.5  Assessment and plan:  Sepsis POA Multifocal pneumonia Acute hypoxic respiratory failure:  Recently completed antibiotics.  On presentation RR 25 W/ leukocytosis pneumonia meeting sepsis criteria Hemodynamically stable.  Still on supplemental oxygen 3.5 L will try to wean down. Continue broad-spectrum antibiotics-if MRSA nare negative will de-escalate antibiotics Follow-up sputum culture blood culture urinary antigens. she had hemoptysis and hypoxia-checked D-dimer came elevated but unable to get CTA due to contrast  allergy and renal dysfunction-getting echocardiogram duplex and VQ scan-low concern for VTE, as her hypoxia and hematemesis could be explained by her pneumonia. Continue supplemental oxygen  AKI on CKD stage 4 Metabolic acidosis: B/L Creat ~ 2.1 09/2023, improved to baseline from 2.4, monitor .  Her bicarb is down to 14, which she has had in the past, but will check pH on VBG and lactic acid this morning. Recent Labs    06/21/23 0859 06/22/23 0807 06/23/23 0557 06/27/23 0711 06/29/23 0937 07/01/23 0621 07/02/23 0338 10/03/23 1019 05/22/24 1604 05/22/24 1611 05/23/24 0813  BUN 53* 50* 45* 36* 32* 28* 27* 37* 31*  --  36*  CREATININE 1.59* 1.57* 1.48* 1.49* 1.40* 1.70* 1.74* 2.10* 2.41*  --  2.07*  CO2 19* 20* 21* 18* 17* 20* 21* 22 20*  --  14*  K 4.2 4.3 4.1 3.8 4.5 4.2 4.4 4.1 3.8 3.8 4.4    COPD Asthma: No wheezing, continue inhalers, Singulair .  Carotid artery disease PVD : Cont Plavix  and statin  Hypertension Chronic diastolic CHF Leg swelling worse from baseline: Vitals remained stable. Last echo-EF 60-65% with G1 DD on 06/13/2023.  BNP on admission 176 PTA on amlodipine ,Lasix  which was held on admission.  Legs appear swollen will resume Lasix , continue home Toprol  Imdur    Chronic knee pain and ambulatory dysfunction On Chronic norco: Resume at lower dose as she is on chronically on that.  As per daughter in law/son she is mostly bedbound does not do much at home Try to get PT OT involved  Chronic diarrhea: Resume lomotil  prn  Dyslipidemia cont Lipitor.  Chronic depression: Continue home doxepin , Remeron , Effexor   Chronic Rash/eczema: Appears generalized on extremities and back with excoriation, going on for 3 months. 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 D/c isolation.  Seizure disorder: continue home Keppra   IDA/anemia of CKD: Hemoglobin at  baseline 10 to 11 g.  Continue home iron  supplement monitor. Recent Labs  Lab 05/19/24 1110 05/22/24 1604 05/22/24 1611 05/23/24 0813  HGB 11.2* 10.3* 11.2* 9.5*  HCT 34.7* 32.2* 33.0* 30.7*    T2DM with diabetic peripheral angiopathy without gangrene: Fu ac cont ssi No results for input(s): GLUCAP, HGBA1C in the last 168 hours.   Class I Obesity w/ Body mass index is 30.87 kg/m.: OSA: Will benefit with PCP follow-up, weight loss,healthy lifestyle.  Patient is noncompliant with CPAP as per home Patient is sedentary and not much mobile She is high risk for readmission.  Will get palliative care consulted and discuss CODE STATUS  Mobility: PT Orders: Active PT Follow up Rec:     DVT prophylaxis: enoxaparin  (LOVENOX ) injection 30 mg Start: 05/22/24 2000 SCDs Start: 05/22/24 1814 Code Status:   Code Status: Full Code Family Communication: plan of care discussed with patient at bedside.  I have called and updated patient's son. Patient status is: Remains hospitalized because of severity of illness Level of care: Telemetry Medical   Dispo: The patient is from: home alone, son checks on her             Anticipated disposition: TBD Objective: Vitals last 24 hrs: Vitals:   05/22/24 2346 05/23/24 0432 05/23/24 0728 05/23/24 1014  BP: (!) 171/70 (!) 175/63 (!) 165/57   Pulse: 77 77 80   Resp: 17 18 19    Temp: (!) 97.5 F (36.4 C) 98 F (36.7 C) 98.2 F (36.8 C) 98.1 F (36.7 C)  TempSrc: Oral Oral Oral Oral  SpO2: 99% 99% 99%   Weight:  74.1 kg    Height:        Physical Examination: General exam: alert awake, oriented, older than stated age HEENT:Oral mucosa moist, Ear/Nose WNL grossly Respiratory system: Bilaterally crackles,no use of accessory muscle Cardiovascular system: S1 & S2 +, No JVD. Gastrointestinal system: Abdomen soft,NT,ND, BS+ Nervous System: Alert, awake, moving all extremities,and following commands. Extremities: LE edema ++, distal extremities  warm.  Skin: rashes on back extremities,no icterus. MSK: Normal muscle bulk,tone, power   Medications reviewed:  Scheduled Meds:  atorvastatin   40 mg Oral Daily   budesonide -glycopyrrolate -formoterol   2 puff Inhalation BID   clopidogrel   75 mg Oral Daily   cyanocobalamin   500 mcg Oral Daily   doxepin   10 mg Oral QHS   enoxaparin  (LOVENOX ) injection  30 mg Subcutaneous Q24H   furosemide   20 mg Oral Daily   guaiFENesin   600 mg Oral BID   insulin  aspart  0-5 Units Subcutaneous QHS   insulin  aspart  0-9 Units Subcutaneous TID WC   iron  polysaccharides  150 mg Oral Daily   isosorbide  mononitrate  60 mg Oral Daily   levETIRAcetam   500 mg Oral BID   metoprolol  succinate  100 mg Oral Daily   mirtazapine   15 mg Oral QHS   montelukast   10 mg Oral Daily   mupirocin  ointment  1 Application Topical BID   pantoprazole   40 mg Oral BID   vancomycin  variable dose per unstable renal function (pharmacist dosing)   Does not apply See admin instructions   venlafaxine  XR  75 mg Oral Daily   Continuous Infusions:  ceFEPime  (MAXIPIME ) IV     Diet: Diet Order             Diet Carb  Modified Fluid consistency: Thin; Room service appropriate? Yes; Fluid restriction: 1800 mL Fluid  Diet effective now                          Data Reviewed: I have personally reviewed following labs and imaging studies ( see epic result tab) CBC: Recent Labs  Lab 05/19/24 1110 05/22/24 1604 05/22/24 1611 05/23/24 0813  WBC 10.3 36.6*  --  32.8*  NEUTROABS 7.1 30.7*  --   --   HGB 11.2* 10.3* 11.2* 9.5*  HCT 34.7* 32.2* 33.0* 30.7*  MCV 87.6 88.5  --  91.4  PLT 267.0 236  --  197   CMP: Recent Labs  Lab 05/22/24 1604 05/22/24 1611 05/23/24 0813  NA 132* 134* 134*  K 3.8 3.8 4.4  CL 100  --  105  CO2 20*  --  14*  GLUCOSE 104*  --  93  BUN 31*  --  36*  CREATININE 2.41*  --  2.07*  CALCIUM  8.2*  --  8.1*   GFR: Estimated Creatinine Clearance: 21.3 mL/min (A) (by C-G formula based on SCr of  2.07 mg/dL (H)). Recent Labs  Lab 05/22/24 1604  AST 36  ALT 15  ALKPHOS 105  BILITOT 1.0  PROT 5.9*  ALBUMIN  2.5*   No results for input(s): LIPASE, AMYLASE in the last 168 hours. No results for input(s): AMMONIA in the last 168 hours. Coagulation Profile: No results for input(s): INR, PROTIME in the last 168 hours. Unresulted Labs (From admission, onward)     Start     Ordered   05/29/24 0500  Creatinine, serum  (enoxaparin  (LOVENOX )    CrCl < 30 ml/min)  Once,   R       Comments: while on enoxaparin  therapy.    05/22/24 1813   05/23/24 2300  Vancomycin , random  Once,   R       Question:  Specimen collection method  Answer:  Lab=Lab collect   05/23/24 0947   05/23/24 0948  Lactic acid, plasma  (Lactic Acid)  ONCE - STAT,   STAT       Question:  Specimen collection method  Answer:  Lab=Lab collect   05/23/24 0947   05/23/24 0948  Blood gas, venous  ONCE - STAT,   STAT       Question:  Specimen collection method  Answer:  Lab=Lab collect   05/23/24 0947   05/23/24 0500  Basic metabolic panel with GFR  Daily,   R      05/22/24 1813   05/23/24 0500  CBC  Daily,   R      05/22/24 1813   05/22/24 1814  Expectorated Sputum Assessment w Gram Stain, Rflx to Resp Cult  (COPD / Pneumonia / Cellulitis / Lower Extremity Wound (Diabetic Foot Infection))  Once,   R        05/22/24 1813   05/22/24 1814  Legionella Pneumophila Serogp 1 Ur Ag  (COPD / Pneumonia / Cellulitis / Lower Extremity Wound (Diabetic Foot Infection))  Once,   R        05/22/24 1813   05/22/24 1814  Strep pneumoniae urinary antigen  (COPD / Pneumonia / Cellulitis / Lower Extremity Wound (Diabetic Foot Infection))  Once,   R        05/22/24 1813   05/22/24 1814  Hemoglobin A1c  (Glycemic Control (SSI)  Q 4 Hours / Glycemic Control (SSI)  AC +/- HS)  Once,   R       Comments: To assess prior glycemic control    05/22/24 1813   05/22/24 1814  MRSA Next Gen by PCR, Nasal  ONCE - URGENT,   URGENT        05/22/24  1813   05/22/24 1556  Blood culture (routine x 2)  BLOOD CULTURE X 2,   R (with STAT occurrences)      05/22/24 1555           Antimicrobials/Microbiology: Anti-infectives (From admission, onward)    Start     Dose/Rate Route Frequency Ordered Stop   05/23/24 1800  ceFEPIme  (MAXIPIME ) 2 g in sodium chloride  0.9 % 100 mL IVPB        2 g 200 mL/hr over 30 Minutes Intravenous Every 24 hours 05/22/24 1905     05/23/24 0215  vancomycin  (VANCOREADY) IVPB 1250 mg/250 mL        1,250 mg 166.7 mL/hr over 90 Minutes Intravenous  Once 05/23/24 0121 05/23/24 0349   05/22/24 1905  vancomycin  variable dose per unstable renal function (pharmacist dosing)         Does not apply See admin instructions 05/22/24 1906     05/22/24 1745  ceFEPIme  (MAXIPIME ) 2 g in sodium chloride  0.9 % 100 mL IVPB        2 g 200 mL/hr over 30 Minutes Intravenous  Once 05/22/24 1734 05/22/24 1818   05/22/24 1730  vancomycin  (VANCOCIN ) IVPB 1000 mg/200 mL premix  Status:  Discontinued        1,000 mg 200 mL/hr over 60 Minutes Intravenous  Once 05/22/24 1729 05/23/24 0121         Component Value Date/Time   SDES BLOOD SITE NOT SPECIFIED 06/04/2023 0922   SPECREQUEST  06/04/2023 0922    BOTTLES DRAWN AEROBIC AND ANAEROBIC Blood Culture adequate volume   CULT  06/04/2023 0922    NO GROWTH 5 DAYS Performed at [facility], [address], [place], [place] [address]    REPTSTATUS 06/09/2023 FINAL 06/04/2023 9077    Procedures:    Mennie LAMY, MD [facility] 05/23/2024, 10:44 AM

## 2024-05-23 NOTE — Progress Notes (Signed)
 BLE venous exam is completed. [name], RVT

## 2024-05-23 NOTE — Plan of Care (Signed)
   Problem: Coping: Goal: Ability to adjust to condition or change in health will improve Outcome: Progressing   Problem: Education: Goal: Knowledge of General Education information will improve Description: Including pain rating scale, medication(s)/side effects and non-pharmacologic comfort measures Outcome: Progressing   Problem: Health Behavior/Discharge Planning: Goal: Ability to manage health-related needs will improve Outcome: Progressing

## 2024-05-24 ENCOUNTER — Ambulatory Visit: Payer: Self-pay | Admitting: Gastroenterology

## 2024-05-24 ENCOUNTER — Ambulatory Visit (HOSPITAL_COMMUNITY): Payer: Self-pay

## 2024-05-24 DIAGNOSIS — D509 Iron deficiency anemia, unspecified: Secondary | ICD-10-CM

## 2024-05-24 DIAGNOSIS — J189 Pneumonia, unspecified organism: Secondary | ICD-10-CM | POA: Diagnosis not present

## 2024-05-24 LAB — BASIC METABOLIC PANEL WITH GFR
Anion gap: 10 (ref 5–15)
BUN: 29 mg/dL — ABNORMAL HIGH (ref 8–23)
CO2: 23 mmol/L (ref 22–32)
Calcium: 8.4 mg/dL — ABNORMAL LOW (ref 8.9–10.3)
Chloride: 105 mmol/L (ref 98–111)
Creatinine, Ser: 1.84 mg/dL — ABNORMAL HIGH (ref 0.44–1.00)
GFR, Estimated: 28 mL/min — ABNORMAL LOW (ref >60)
Glucose, Bld: 87 mg/dL (ref 70–99)
Potassium: 3.5 mmol/L (ref 3.5–5.1)
Sodium: 138 mmol/L (ref 135–145)

## 2024-05-24 LAB — CBC
HCT: 31.1 % — ABNORMAL LOW (ref 36.0–46.0)
Hemoglobin: 9.9 g/dL — ABNORMAL LOW (ref 12.0–15.0)
MCH: 28 pg (ref 26.0–34.0)
MCHC: 31.8 g/dL (ref 30.0–36.0)
MCV: 87.9 fL (ref 80.0–100.0)
Platelets: 213 K/uL (ref 150–400)
RBC: 3.54 MIL/uL — ABNORMAL LOW (ref 3.87–5.11)
RDW: 15.6 % — ABNORMAL HIGH (ref 11.5–15.5)
WBC: 20.5 K/uL — ABNORMAL HIGH (ref 4.0–10.5)
nRBC: 0 % (ref 0.0–0.2)

## 2024-05-24 LAB — GLUCOSE, CAPILLARY
Glucose-Capillary: 88 mg/dL (ref 70–99)
Glucose-Capillary: 122 mg/dL — ABNORMAL HIGH (ref 70–99)
Glucose-Capillary: 99 mg/dL (ref 70–99)
Glucose-Capillary: 107 mg/dL — ABNORMAL HIGH (ref 70–99)

## 2024-05-24 LAB — HEMOGLOBIN A1C
Hgb A1c MFr Bld: 6.1 % — ABNORMAL HIGH (ref 4.8–5.6)
Mean Plasma Glucose: 128 mg/dL

## 2024-05-24 LAB — MAGNESIUM: Magnesium: 1.1 mg/dL — ABNORMAL LOW (ref 1.7–2.4)

## 2024-05-24 MED ORDER — AMLODIPINE BESYLATE 10 MG PO TABS
10.0000 mg | ORAL_TABLET | Freq: Every day | ORAL | Status: DC
  Administered 2024-05-24 – 2024-05-28 (×8): 10 mg via ORAL
  Filled 2024-05-24 (×5): qty 1

## 2024-05-24 MED ORDER — SODIUM BICARBONATE 650 MG PO TABS
650.0000 mg | ORAL_TABLET | Freq: Two times a day (BID) | ORAL | Status: DC
  Administered 2024-05-24 (×2): 650 mg via ORAL
  Filled 2024-05-24: qty 1

## 2024-05-24 MED ORDER — HYDROCODONE-ACETAMINOPHEN 10-325 MG PO TABS
1.0000 | ORAL_TABLET | Freq: Three times a day (TID) | ORAL | Status: DC | PRN
  Administered 2024-05-24 – 2024-05-28 (×17): 1 via ORAL
  Filled 2024-05-24 (×11): qty 1

## 2024-05-24 MED ORDER — MAGNESIUM SULFATE 4 GM/100ML IV SOLN
4.0000 g | Freq: Once | INTRAVENOUS | Status: AC
  Administered 2024-05-24 (×2): 4 g via INTRAVENOUS
  Filled 2024-05-24 (×2): qty 100

## 2024-05-24 MED ORDER — HYDRALAZINE HCL 25 MG PO TABS
25.0000 mg | ORAL_TABLET | Freq: Four times a day (QID) | ORAL | Status: DC | PRN
  Administered 2024-05-24 – 2024-05-25 (×6): 25 mg via ORAL
  Filled 2024-05-24 (×3): qty 1

## 2024-05-24 MED ORDER — DOXYCYCLINE HYCLATE 100 MG PO TABS
100.0000 mg | ORAL_TABLET | Freq: Two times a day (BID) | ORAL | Status: DC
  Administered 2024-05-24 – 2024-05-28 (×15): 100 mg via ORAL
  Filled 2024-05-24 (×9): qty 1

## 2024-05-24 MED ORDER — HYDROCODONE-ACETAMINOPHEN 5-325 MG PO TABS: 1.0000 | ORAL_TABLET | Freq: Three times a day (TID) | ORAL | Status: DC | PRN

## 2024-05-24 MED ORDER — FERROUS GLUCONATE 324 (38 FE) MG PO TABS: 324.0000 mg | ORAL_TABLET | Freq: Every day | ORAL | 2 refills | Status: DC

## 2024-05-24 MED ORDER — POTASSIUM CHLORIDE CRYS ER 20 MEQ PO TBCR
20.0000 meq | EXTENDED_RELEASE_TABLET | Freq: Once | ORAL | Status: AC
  Administered 2024-05-24 (×2): 20 meq via ORAL
  Filled 2024-05-24: qty 1

## 2024-05-24 MED ORDER — METHOCARBAMOL 500 MG PO TABS
500.0000 mg | ORAL_TABLET | Freq: Once | ORAL | Status: AC | PRN
  Administered 2024-05-24 (×2): 500 mg via ORAL
  Filled 2024-05-24: qty 1

## 2024-05-24 NOTE — Progress Notes (Signed)
 PT Cancellation Note  Patient Details Name: [name] MRN: [id number] DOB: 1945/10/30   Cancelled Treatment:    Reason Eval/Treat Not Completed: Fatigue/lethargy limiting ability to participate. Pt reports she is too tired to participate with PT this afternoon. PT woke her upon entry. Pt reports she is tired from OT session earlier and from getting medication a little while ago. Pt politely asks that PT return tomorrow for evaluation.    [name] 05/24/2024, 4:34 PM  Leita Sable, PT, DPT [facility] Secure Chat Preferred Office: [phone]

## 2024-05-24 NOTE — Plan of Care (Signed)
  Problem: Education: Goal: Ability to describe self-care measures that may prevent or decrease complications (Diabetes Survival Skills Education) will improve Outcome: Progressing   Problem: Pain Managment: Goal: General experience of comfort will improve and/or be controlled Outcome: Progressing   Problem: Safety: Goal: Ability to remain free from injury will improve Outcome: Progressing

## 2024-05-24 NOTE — Progress Notes (Signed)
 PROGRESS NOTE SHANIE MAUZY  FMW:[id number] DOB: 07/28/1946 DOA: 05/22/2024 PCP: Yolande Toribio MATSU, MD  Brief Narrative/Hospital Course: 78 year old female with hypertension, chronic combined systolic and diastolic CHF, asthma/COPD, anemia, chronic pancreatitis, depression, type 2 diabetes with neuropathy, PVD with recently diagnosed pneumonia a week ago, and has completed antibiotics (cefdinir) presenting with worsening shortness of breath, coughing and blood-tinged sputum.  As per ED patient's son also reports patient has been little confused over the last few days. In the ZI:Yzfnibwjfprjoob stable although mild hypoxic needing supplemental oxygen,  labs with mild hyponatremia creatinine 2.4 BUN 31 normal LFTs lactic acid normal 1.4 labs with a leukocytosis 36.6 hemoglobin 10.3 COVID influenza RSV negative. Cxr>multifocal pneumonia right lung, aortic atherosclerosis and emphysema. Blood culture sent, given vancomycin  cefepime  and morphine , admission requested. She c/o leg swelling worse from baseline her normaL weight in around 153lb, weight is pending in ED.  Subjective: Seen and examined  Overnight blood pressure trending high to 190s Labs reviewed bicarb normalized creatinine slightly up 2.2 yesterday evening A.m. labs pending -this morning but subsequently resulted creatinine improved to 1.8 WBC down to 20.5 Overall she is feeling better still needing supplemental oxygen  Assessment and plan:  Sepsis POA Multifocal pneumonia Acute hypoxic respiratory failure:  Recently completed antibiotics.  On presentation RR 25 W/ leukocytosis pneumonia meeting sepsis criteria Hemodynamically stable.  Blood culture NGTD. Sputum cx ordered.  Urine strep and pneumonia antigen pending MRSA PCR negative will discontinue vancomycin , continue her cefepime , add doxy D-dimer positive but VQ scan negative  AKI on CKD stage 4 Metabolic acidosis: B/L Creat ~ 2.1 on  09/2023.  Bicarb improved.  Creatinine  improving, monitor renal function continue p.o. bicarb Recent Labs    06/23/23 0557 06/27/23 0711 06/29/23 0937 07/01/23 0621 07/02/23 0338 10/03/23 1019 05/22/24 1604 05/22/24 1611 05/23/24 0813 05/23/24 1854 05/24/24 0700  BUN 45* 36* 32* 28* 27* 37* 31*  --  36* 35* 29*  CREATININE 1.48* 1.49* 1.40* 1.70* 1.74* 2.10* 2.41*  --  2.07* 2.27* 1.84*  CO2 21* 18* 17* 20* 21* 22 20*  --  14* 22 23  K 4.1 3.8 4.5 4.2 4.4 4.1 3.8 3.8 4.4 3.8 3.5    COPD Asthma: No wheezing, continue inhalers, Singulair .  Carotid artery disease PVD : Cont Plavix  and statin  Hypertension Chronic diastolic CHF Leg swelling worse from baseline: Blood pressure he poorly controlled.  Echo updated 8/20 EF normal 55-60, G1 DD RV size normal, MV-degenerative, AV not well-visualized. BNP on admission 176. Have chronic appearing leg swelling-continue Lasix  Toprol , resume amlodipine .  No longer on Imdur . Add po prn hydralazine  prn  Chronic knee pain and ambulatory dysfunction On Chronic norco: Continue hydrocodone  at lower dose.  She chronically takes it As per daughter in law/son she is mostly bedbound does not do much at home. Cont PT OT   Chronic diarrhea Chronic pancreatitis: Cont lomotil  prn  Dyslipidemia cont Lipitor.  Chronic depression: Continue home doxepin , Remeron , Effexor   Chronic Rash/eczema: Appears generalized on extremities and back with excoriation, going on for 3 months. 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 D/c isolation.  Discussed with infectious disease  Seizure disorder: continue home Keppra   IDA/anemia of CKD: Hemoglobin at baseline 10 to 11 g.  Continue home iron  supplement monitor. Recent Labs  Lab 05/19/24 1110 05/22/24 1604 05/22/24 1611 05/23/24 0813 05/24/24 0700  HGB 11.2* 10.3* 11.2*  9.5* 9.9*  HCT 34.7* 32.2* 33.0* 30.7* 31.1*    T2DM with  diabetic peripheral angiopathy without gangrene: Blood sugar well-controlled sliding scale  Recent Labs  Lab 05/22/24 2153 05/23/24 1142 05/23/24 1632 05/23/24 2131 05/24/24 0519  GLUCAP  --  121* 120* 86 88  HGBA1C 6.1*  --   --   --   --      Class I Obesity w/ Body mass index is 30.87 kg/m.: OSA: Will benefit with PCP follow-up, weight loss,healthy lifestyle.  Patient is noncompliant with CPAP as per home Patient is sedentary and not much mobile. She is high risk for readmission.  Will get palliative care consulted and discuss CODE STATUS  Mobility: PT Orders: Active PT Follow up Rec:     DVT prophylaxis: enoxaparin  (LOVENOX ) injection 30 mg Start: 05/22/24 2000 SCDs Start: 05/22/24 1814 Code Status:   Code Status: Full Code Family Communication: plan of care discussed with patient at bedside.  I had called and updated patient's son 8/10. Patient status is: Remains hospitalized because of severity of illness Level of care: Telemetry Medical   Dispo: The patient is from: home alone, son checks on her             Anticipated disposition: TBD Objective: Vitals last 24 hrs: Vitals:   05/24/24 0420 05/24/24 0422 05/24/24 0722 05/24/24 0842  BP: (!) 195/75 (!) 172/97 (!) 196/74 (!) 164/70  Pulse: 78 81 80   Resp:      Temp:  97.7 F (36.5 C) 97.8 F (36.6 C)   TempSrc:  Oral Oral   SpO2:  97% 98%   Weight:      Height:        Physical Examination: General exam: AAOX3 HEENT:Oral mucosa moist, Ear/Nose WNL grossly Respiratory system: Bilaterally diminished,no use of accessory muscle Cardiovascular system: S1 & S2 +, No JVD. Gastrointestinal system: Abdomen soft,NT,ND, BS+ Nervous System: Alert, awake, moving all extremities,and following commands. Extremities: LE edema present, distal extremities warm.  Skin: rashes on back extremities,no icterus. MSK: Normal muscle bulk,tone, power   Medications reviewed:  Scheduled Meds:  amLODipine   10 mg Oral Daily    atorvastatin   40 mg Oral Daily   budesonide -glycopyrrolate -formoterol   2 puff Inhalation BID   clopidogrel   75 mg Oral Daily   cyanocobalamin   500 mcg Oral Daily   doxepin   10 mg Oral QHS   doxycycline   100 mg Oral Q12H   enoxaparin  (LOVENOX ) injection  30 mg Subcutaneous Q24H   furosemide   20 mg Oral Daily   guaiFENesin   600 mg Oral BID   insulin  aspart  0-5 Units Subcutaneous QHS   insulin  aspart  0-9 Units Subcutaneous TID WC   iron  polysaccharides  150 mg Oral Daily   levETIRAcetam   500 mg Oral BID   metoprolol  succinate  100 mg Oral Daily   mirtazapine   15 mg Oral QHS   montelukast   10 mg Oral Daily   mupirocin  ointment  1 Application Topical BID   pantoprazole   40 mg Oral BID   sodium bicarbonate   1,300 mg Oral BID   venlafaxine  XR  75 mg Oral Daily   Continuous Infusions:  ceFEPime  (MAXIPIME ) IV 2 g (05/23/24 1713)   Diet: Diet Order             Diet Carb Modified Fluid consistency: Thin; Room service appropriate? Yes; Fluid restriction: 1800 mL Fluid  Diet effective now  Data Reviewed: I have personally reviewed following labs and imaging studies ( see epic result tab) CBC: Recent Labs  Lab 05/19/24 1110 05/22/24 1604 05/22/24 1611 05/23/24 0813 05/24/24 0700  WBC 10.3 36.6*  --  32.8* 20.5*  NEUTROABS 7.1 30.7*  --   --   --   HGB 11.2* 10.3* 11.2* 9.5* 9.9*  HCT 34.7* 32.2* 33.0* 30.7* 31.1*  MCV 87.6 88.5  --  91.4 87.9  PLT 267.0 236  --  197 213   CMP: Recent Labs  Lab 05/22/24 1604 05/22/24 1611 05/23/24 0813 05/23/24 1854 05/24/24 0700  NA 132* 134* 134* 137 138  K 3.8 3.8 4.4 3.8 3.5  CL 100  --  105 104 105  CO2 20*  --  14* 22 23  GLUCOSE 104*  --  93 112* 87  BUN 31*  --  36* 35* 29*  CREATININE 2.41*  --  2.07* 2.27* 1.84*  CALCIUM  8.2*  --  8.1* 8.1* 8.4*   GFR: Estimated Creatinine Clearance: 23.9 mL/min (A) (by C-G formula based on SCr of 1.84 mg/dL (H)). Recent Labs  Lab 05/22/24 1604  AST 36   ALT 15  ALKPHOS 105  BILITOT 1.0  PROT 5.9*  ALBUMIN  2.5*   No results for input(s): LIPASE, AMYLASE in the last 168 hours. No results for input(s): AMMONIA in the last 168 hours. Coagulation Profile: No results for input(s): INR, PROTIME in the last 168 hours. Unresulted Labs (From admission, onward)     Start     Ordered   05/29/24 0500  Creatinine, serum  (enoxaparin  (LOVENOX )    CrCl < 30 ml/min)  Once,   R       Comments: while on enoxaparin  therapy.    05/22/24 1813   05/24/24 0729  C Difficile Quick Screen w PCR reflex  (C Difficile quick screen w PCR reflex panel )  Once, for 24 hours,   TIMED       References:    CDiff Information Tool   05/24/24 0728   05/23/24 0500  Basic metabolic panel with GFR  Daily,   R      05/22/24 1813   05/23/24 0500  CBC  Daily,   R      05/22/24 1813   05/22/24 1814  Expectorated Sputum Assessment w Gram Stain, Rflx to Resp Cult  (COPD / Pneumonia / Cellulitis / Lower Extremity Wound (Diabetic Foot Infection))  Once,   R        05/22/24 1813   05/22/24 1814  Legionella Pneumophila Serogp 1 Ur Ag  (COPD / Pneumonia / Cellulitis / Lower Extremity Wound (Diabetic Foot Infection))  Once,   R        05/22/24 1813   05/22/24 1814  Strep pneumoniae urinary antigen  (COPD / Pneumonia / Cellulitis / Lower Extremity Wound (Diabetic Foot Infection))  Once,   R        05/22/24 1813           Antimicrobials/Microbiology: Anti-infectives (From admission, onward)    Start     Dose/Rate Route Frequency Ordered Stop   05/24/24 1000  doxycycline  (VIBRA -TABS) tablet 100 mg        100 mg Oral Every 12 hours 05/24/24 0731     05/23/24 1800  ceFEPIme  (MAXIPIME ) 2 g in sodium chloride  0.9 % 100 mL IVPB        2 g 200 mL/hr over 30 Minutes Intravenous Every 24 hours 05/22/24 1905  05/23/24 0215  vancomycin  (VANCOREADY) IVPB 1250 mg/250 mL        1,250 mg 166.7 mL/hr over 90 Minutes Intravenous  Once 05/23/24 0121 05/23/24 0349   05/22/24 1905   vancomycin  variable dose per unstable renal function (pharmacist dosing)  Status:  Discontinued         Does not apply See admin instructions 05/22/24 1906 05/24/24 0731   05/22/24 1745  ceFEPIme  (MAXIPIME ) 2 g in sodium chloride  0.9 % 100 mL IVPB        2 g 200 mL/hr over 30 Minutes Intravenous  Once 05/22/24 1734 05/22/24 1818   05/22/24 1730  vancomycin  (VANCOCIN ) IVPB 1000 mg/200 mL premix  Status:  Discontinued        1,000 mg 200 mL/hr over 60 Minutes Intravenous  Once 05/22/24 1729 05/23/24 0121         Component Value Date/Time   SDES BLOOD LEFT ARM 05/22/2024 1604   SPECREQUEST  05/22/2024 1604    BOTTLES DRAWN AEROBIC AND ANAEROBIC Blood Culture adequate volume   CULT  05/22/2024 1604    NO GROWTH 2 DAYS Performed at [facility], [address] [place], [place] [address]    REPTSTATUS PENDING 05/22/2024 8395    Procedures:    Mennie LAMY, MD [facility] 05/24/2024, 10:45 AM

## 2024-05-24 NOTE — Evaluation (Signed)
 Occupational Therapy Evaluation Patient Details Name: [name] MRN: [id number] DOB: May 05, 1946 Today's Date: 05/24/2024   History of Present Illness   [name] is a 78 yo female who presented with worsening shortness of breath, coughing and blood-tinged sputum.  Admitted for PNS. 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     Clinical Impressions [name] was evaluated s/p the above admission list. She lives alone and is typically mod I with RW at baseline. Upon evaluation the pt was limited by generalized weakness, back pain, poor activity tolerance and SOB with activity. Overall she needed CGA for transfers and mobility with RW. Due to the deficits listed below the pt also needs up to min A for LB ADLs and set up A for UB ADLs in sitting. Pt will benefit from continued acute OT services and HHOT      If plan is discharge home, recommend the following:   A little help with walking and/or transfers;A little help with bathing/dressing/bathroom;Assistance with cooking/housework;Assist for transportation;Help with stairs or ramp for entrance     Functional Status Assessment   Patient has had a recent decline in their functional status and demonstrates the ability to make significant improvements in function in a reasonable and predictable amount of time.     Equipment Recommendations   None recommended by OT      Precautions/Restrictions   Precautions Precautions: Fall Restrictions Weight Bearing Restrictions Per Provider Order: No     Mobility Bed Mobility Overal bed mobility: Needs Assistance Bed Mobility: Supine to Sit     Supine to sit: Contact guard          Transfers Overall transfer level: Needs assistance Equipment used: Rolling walker (2  wheels) Transfers: Sit to/from Stand Sit to Stand: Contact guard assist                  Balance Overall balance assessment: Needs assistance Sitting-balance support: Feet supported Sitting balance-Leahy Scale: Good     Standing balance support: Bilateral upper extremity supported, During functional activity Standing balance-Leahy Scale: Poor                             ADL either performed or assessed with clinical judgement   ADL Overall ADL's : Needs assistance/impaired Eating/Feeding: Independent   Grooming: Set up;Sitting   Upper Body Bathing: Set up;Sitting   Lower Body Bathing: Contact guard assist;Sit to/from stand   Upper Body Dressing : Set up;Sitting   Lower Body Dressing: Minimal assistance;Sit to/from stand   Toilet Transfer: Occupational hygienist and Hygiene: Supervision/safety;Sitting/lateral lean       Functional mobility during ADLs: Contact guard assist;Rolling walker (2 wheels) General ADL Comments: limited by back pain, generalized weakness, SOB and poor activity tolerance     Vision Baseline Vision/History: 1 Wears glasses Vision Assessment?: No apparent visual deficits     Perception Perception: Within Functional Limits       Praxis Praxis: WFL       Pertinent Vitals/Pain Pain Assessment Pain Assessment: Faces Faces Pain Scale: Hurts a little bit Pain Location: back Pain Descriptors / Indicators: Discomfort, Grimacing Pain Intervention(s): Limited activity within patient's tolerance, Monitored during session     Extremity/Trunk Assessment Upper Extremity Assessment Upper Extremity Assessment: Generalized  weakness   Lower Extremity Assessment Lower Extremity Assessment: Defer to PT evaluation   Cervical / Trunk Assessment Cervical / Trunk Assessment: Kyphotic   Communication Communication Communication: No apparent difficulties   Cognition  Arousal: Alert Behavior During Therapy: WFL for tasks assessed/performed Cognition: No apparent impairments                               Following commands: Intact       Cueing  General Comments   Cueing Techniques: Verbal cues  VSS on RA, 3L reapplied at the end of the session           Home Living Family/patient expects to be discharged to:: Private residence Living Arrangements: Alone Available Help at Discharge: Family;Friend(s);Available PRN/intermittently Type of Home: House Home Access: Stairs to enter [employer] of Steps: 2 Entrance Stairs-Rails: Right;Left Home Layout: One level     Bathroom Shower/Tub: Chief Strategy Officer: Standard Bathroom Accessibility: Yes   Home Equipment: Agricultural consultant (2 wheels);Tub bench   Additional Comments: Son and daughter-in-law live nearby.; Reports typically sleeping on the couch      Prior Functioning/Environment Prior Level of Function : Independent/Modified Independent;Needs assist             Mobility Comments: RW for most mobility, denies falls ADLs Comments: mod I, sleeps on couch    OT Problem List: Decreased strength;Decreased range of motion;Impaired balance (sitting and/or standing);Decreased activity tolerance;Decreased safety awareness;Decreased knowledge of use of DME or AE;Decreased knowledge of precautions   OT Treatment/Interventions: Self-care/ADL training;Therapeutic exercise;DME and/or AE instruction;Splinting;Energy conservation;Patient/family education;Balance training      OT Goals(Current goals can be found in the care plan section)   Acute Rehab OT Goals Patient Stated Goal: to feel better OT Goal Formulation: With patient Time For Goal Achievement: 06/07/24 Potential to Achieve Goals: Good ADL Goals Pt Will Perform Grooming: with modified independence Pt Will Perform Lower Body Dressing: with modified independence;sit to/from stand Pt Will  Transfer to Toilet: with modified independence Additional ADL Goal #1: pt will tolerate at least 10 minutes of OOB activity to demonstrate improved endurance for ADLs   OT Frequency:  Min 2X/week       AM-PAC OT 6 Clicks Daily Activity     Outcome Measure Help from another person eating meals?: None Help from another person taking care of personal grooming?: A Little Help from another person toileting, which includes using toliet, bedpan, or urinal?: A Little Help from another person bathing (including washing, rinsing, drying)?: A Little Help from another person to put on and taking off regular upper body clothing?: A Little Help from another person to put on and taking off regular lower body clothing?: A Little 6 Click Score: 19   End of Session Equipment Utilized During Treatment: Rolling walker (2 wheels);Gait belt Nurse Communication: Mobility status  Activity Tolerance: Patient tolerated treatment well Patient left: in bed;with call bell/phone within reach  OT Visit Diagnosis: Unsteadiness on feet (R26.81);Other abnormalities of gait and mobility (R26.89);Muscle weakness (generalized) (M62.81)                Time: 8684-8667 OT Time Calculation (min): 17 min Charges:  OT General Charges $OT Visit: 1 Visit OT Evaluation $OT Eval Moderate Complexity: 1 Mod  Lucie Kendall, OTR/L Acute Rehabilitation Services Office [phone] Secure Chat Communication Preferred   Lucie JONETTA Kendall 05/24/2024, 2:30 PM

## 2024-05-25 DIAGNOSIS — J189 Pneumonia, unspecified organism: Secondary | ICD-10-CM | POA: Diagnosis not present

## 2024-05-25 LAB — GLUCOSE, CAPILLARY
Glucose-Capillary: 92 mg/dL (ref 70–99)
Glucose-Capillary: 144 mg/dL — ABNORMAL HIGH (ref 70–99)
Glucose-Capillary: 95 mg/dL (ref 70–99)
Glucose-Capillary: 113 mg/dL — ABNORMAL HIGH (ref 70–99)
Glucose-Capillary: 100 mg/dL — ABNORMAL HIGH (ref 70–99)

## 2024-05-25 LAB — BASIC METABOLIC PANEL WITH GFR
Anion gap: 10 (ref 5–15)
BUN: 25 mg/dL — ABNORMAL HIGH (ref 8–23)
CO2: 22 mmol/L (ref 22–32)
Calcium: 8.6 mg/dL — ABNORMAL LOW (ref 8.9–10.3)
Chloride: 102 mmol/L (ref 98–111)
Creatinine, Ser: 1.57 mg/dL — ABNORMAL HIGH (ref 0.44–1.00)
GFR, Estimated: 34 mL/min — ABNORMAL LOW (ref >60)
Glucose, Bld: 86 mg/dL (ref 70–99)
Potassium: 3.9 mmol/L (ref 3.5–5.1)
Sodium: 134 mmol/L — ABNORMAL LOW (ref 135–145)

## 2024-05-25 LAB — CBC
HCT: 29.4 % — ABNORMAL LOW (ref 36.0–46.0)
Hemoglobin: 9.5 g/dL — ABNORMAL LOW (ref 12.0–15.0)
MCH: 27.9 pg (ref 26.0–34.0)
MCHC: 32.3 g/dL (ref 30.0–36.0)
MCV: 86.2 fL (ref 80.0–100.0)
Platelets: 219 K/uL (ref 150–400)
RBC: 3.41 MIL/uL — ABNORMAL LOW (ref 3.87–5.11)
RDW: 15.5 % (ref 11.5–15.5)
WBC: 14.1 K/uL — ABNORMAL HIGH (ref 4.0–10.5)
nRBC: 0 % (ref 0.0–0.2)

## 2024-05-25 LAB — LEGIONELLA PNEUMOPHILA SEROGP 1 UR AG
L. pneumophila Serogp 1 Ur Ag: NEGATIVE
Source of Sample: 41499

## 2024-05-25 NOTE — Progress Notes (Signed)
 PROGRESS NOTE [name]  FMW:[id number] DOB: 04/19/1946 DOA: 05/22/2024 PCP: Yolande Toribio MATSU, MD  Brief Narrative/Hospital Course: 78 year old female with hypertension, chronic combined systolic and diastolic CHF, asthma/COPD, anemia, chronic pancreatitis, depression, type 2 diabetes with neuropathy, PVD with recently diagnosed pneumonia a week ago, and has completed antibiotics (cefdinir) presenting with worsening shortness of breath, coughing and blood-tinged sputum.  As per ED patient's son also reports patient has been little confused over the last few days. In the ZI:Yzfnibwjfprjoob stable although mild hypoxic needing supplemental oxygen,  labs with mild hyponatremia creatinine 2.4 BUN 31 normal LFTs lactic acid normal 1.4 labs with a leukocytosis 36.6 hemoglobin 10.3 COVID influenza RSV negative. Cxr>multifocal pneumonia right lung, aortic atherosclerosis and emphysema. Blood culture sent, given vancomycin  cefepime  and morphine , admission requested. She c/o leg swelling worse from baseline her normaL weight in around 153lb, weight is pending in ED.  Subjective: Seen and examined She reports she is doing well, Still on nasal cannula.  No new complaints Her blood pressure has been trending up, remains afebrile Labs shows improving renal function and leukocytosis  Assessment and plan:  Sepsis POA Multifocal pneumonia Acute hypoxic respiratory failure:  Recently completed antibiotics and failed outpatient treatment admitted with sepsis/multifocal pneumonia  Overall she seems to be clinically improving WBC count much better.  Blood culture NGTD.  Urine Ag sterp/legionella, sputum cx ordered MRSA PCR negative and vancomycin  stopped-continue on cefepime , doxycycline   D-dimer positive: VQ scan negative, bilateral lower extremity Dopplers negative for DVT  AKI on CKD stage 4 Metabolic acidosis: B/L Creat ~ 2.1 on  09/2023.  Renal function nicely improving w stop oral bicarb  encourage oral intake. Recent Labs    06/27/23 0711 06/29/23 0937 07/01/23 0621 07/02/23 0338 10/03/23 1019 05/22/24 1604 05/22/24 1611 05/23/24 0813 05/23/24 1854 05/24/24 0700 05/25/24 0544  BUN 36* 32* 28* 27* 37* 31*  --  36* 35* 29* 25*  CREATININE 1.49* 1.40* 1.70* 1.74* 2.10* 2.41*  --  2.07* 2.27* 1.84* 1.57*  CO2 18* 17* 20* 21* 22 20*  --  14* 22 23 22   K 3.8 4.5 4.2 4.4 4.1 3.8 3.8 4.4 3.8 3.5 3.9    COPD Asthma: No wheezing, continue inhalers, Singulair .  Carotid artery disease PVD : Cont Plavix  and statin  Hypertension Chronic diastolic CHF Nonsustained V. tach Leg swelling worse from baseline Hypomagnesemia: Echo updated 8/20 EF normal 55-60, G1 DD RV size normal, MV-degenerative, AV not well-visualized. BNP on admission 176. Has chronic appearing leg swelling and Blood pressure poorly controlled here amlodipine  resumed at 10 mg, continue p.o. Lasix  20, Toprol  100 No longer on Imdur  cont po prn hydralazine  prn.  Monitor electrolytes and replace.  Chronic knee pain and ambulatory dysfunction On Chronic norco: Continue hydrocodone  at lower dose. She chronically takes it As per daughter in law/son,  she is mostly bedbound by her choice but she is ambulatory  Cont PT OT   Chronic diarrhea Chronic pancreatitis: Cont lomotil  prn  Dyslipidemia cont Lipitor.  Chronic depression: Continue home doxepin , Remeron , Effexor   Chronic Rash/eczema: Appears generalized on extremities and back with excoriation, going on for 3 months. 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 D/c isolation.  Discussed with infectious disease  Seizure disorder: continue home Keppra   IDA/anemia of CKD: Hemoglobin at baseline 10 to 11 g.  Overall stable monitor,continue iron  supplement monitor. Recent Labs  Lab 05/22/24  1604 05/22/24 1611 05/23/24 0813 05/24/24 0700  05/25/24 0544  HGB 10.3* 11.2* 9.5* 9.9* 9.5*  HCT 32.2* 33.0* 30.7* 31.1* 29.4*    T2DM with diabetic peripheral angiopathy without gangrene: Blood sugar well-controlled sliding scale  Recent Labs  Lab 05/22/24 2153 05/23/24 1142 05/24/24 0519 05/24/24 1130 05/24/24 1609 05/24/24 2148 05/25/24 0616  GLUCAP  --    < > 88 122* 99 107* 92  HGBA1C 6.1*  --   --   --   --   --   --    < > = values in this interval not displayed.     Class I Obesity w/ Body mass index is 30.87 kg/m.: OSA: Will benefit with PCP follow-up, weight loss,healthy lifestyle.  Patient is noncompliant with CPAP as per home Patient is sedentary and not much mobile. She is high risk for readmission. She will benefit palliative care follow-up as outpatient  Mobility: PT Orders: Active PT Follow up Rec:     DVT prophylaxis: enoxaparin  (LOVENOX ) injection 30 mg Start: 05/22/24 2000 SCDs Start: 05/22/24 1814 Code Status:   Code Status: Full Code Family Communication: plan of care discussed with patient at bedside.  I had called and updated patient's son 8/10. Patient status is: Remains hospitalized because of severity of illness Level of care: Telemetry Medical   Dispo: The patient is from: home alone, son checks on her             Anticipated disposition: Possible discharge in next 1 to 2 days Objective: Vitals last 24 hrs: Vitals:   05/24/24 1945 05/24/24 2029 05/25/24 0420 05/25/24 0724  BP:  (!) 138/48 (!) 175/69 (!) 165/65  Pulse:  76 81 88  Resp:   18 17  Temp:  97.6 F (36.4 C) 97.9 F (36.6 C) 97.9 F (36.6 C)  TempSrc:  Oral Oral Oral  SpO2: 98% 97%  96%  Weight:      Height:        Physical Examination: General exam: Alert awake oriented, pleasant  HEENT:Oral mucosa moist, Ear/Nose WNL grossly Respiratory system: B/L diminished,no use of accessory muscle Cardiovascular system: S1 & S2 +, No JVD. Gastrointestinal system: Abdomen soft,NT,ND, BS+ Nervous System: Alert, awake, moving  all extremities,and following commands. Extremities: LE edema mild rashes+ distal extremities warm.  Skin: rashes eczema type/excoriation on the feet and the upper back a  UE MSK: Normal muscle bulk,tone, power   Medications reviewed:  Scheduled Meds:  amLODipine   10 mg Oral Daily   atorvastatin   40 mg Oral Daily   budesonide -glycopyrrolate -formoterol   2 puff Inhalation BID   clopidogrel   75 mg Oral Daily   cyanocobalamin   500 mcg Oral Daily   doxepin   10 mg Oral QHS   doxycycline   100 mg Oral Q12H   enoxaparin  (LOVENOX ) injection  30 mg Subcutaneous Q24H   furosemide   20 mg Oral Daily   guaiFENesin   600 mg Oral BID   insulin  aspart  0-5 Units Subcutaneous QHS   insulin  aspart  0-9 Units Subcutaneous TID WC   iron  polysaccharides  150 mg Oral Daily   levETIRAcetam   500 mg Oral BID   metoprolol  succinate  100 mg Oral Daily   mirtazapine   15 mg Oral QHS   montelukast   10 mg Oral Daily   mupirocin  ointment  1 Application Topical BID   pantoprazole   40 mg Oral BID   venlafaxine  XR  75 mg Oral Daily   Continuous Infusions:  ceFEPime  (MAXIPIME ) IV Stopped (05/24/24 1811)  Diet: Diet Order             Diet Carb Modified Fluid consistency: Thin; Room service appropriate? Yes; Fluid restriction: 1800 mL Fluid  Diet effective now                    Data Reviewed: I have personally reviewed following labs and imaging studies ( see epic result tab) CBC: Recent Labs  Lab 05/19/24 1110 05/22/24 1604 05/22/24 1611 05/23/24 0813 05/24/24 0700 05/25/24 0544  WBC 10.3 36.6*  --  32.8* 20.5* 14.1*  NEUTROABS 7.1 30.7*  --   --   --   --   HGB 11.2* 10.3* 11.2* 9.5* 9.9* 9.5*  HCT 34.7* 32.2* 33.0* 30.7* 31.1* 29.4*  MCV 87.6 88.5  --  91.4 87.9 86.2  PLT 267.0 236  --  197 213 219   CMP: Recent Labs  Lab 05/22/24 1604 05/22/24 1611 05/23/24 0813 05/23/24 1854 05/24/24 0659 05/24/24 0700 05/25/24 0544  NA 132* 134* 134* 137  --  138 134*  K 3.8 3.8 4.4 3.8  --  3.5  3.9  CL 100  --  105 104  --  105 102  CO2 20*  --  14* 22  --  23 22  GLUCOSE 104*  --  93 112*  --  87 86  BUN 31*  --  36* 35*  --  29* 25*  CREATININE 2.41*  --  2.07* 2.27*  --  1.84* 1.57*  CALCIUM  8.2*  --  8.1* 8.1*  --  8.4* 8.6*  MG  --   --   --   --  1.1*  --   --    GFR: Estimated Creatinine Clearance: 28.1 mL/min (A) (by C-G formula based on SCr of 1.57 mg/dL (H)). Recent Labs  Lab 05/22/24 1604  AST 36  ALT 15  ALKPHOS 105  BILITOT 1.0  PROT 5.9*  ALBUMIN  2.5*   No results for input(s): LIPASE, AMYLASE in the last 168 hours. No results for input(s): AMMONIA in the last 168 hours. Coagulation Profile: No results for input(s): INR, PROTIME in the last 168 hours. Unresulted Labs (From admission, onward)     Start     Ordered   05/29/24 0500  Creatinine, serum  (enoxaparin  (LOVENOX )    CrCl < 30 ml/min)  Once,   R       Comments: while on enoxaparin  therapy.    05/22/24 1813   05/24/24 0729  C Difficile Quick Screen w PCR reflex  (C Difficile quick screen w PCR reflex panel )  Once, for 24 hours,   TIMED       References:    CDiff Information Tool   05/24/24 0728   05/23/24 0500  Basic metabolic panel with GFR  Daily,   R      05/22/24 1813   05/23/24 0500  CBC  Daily,   R      05/22/24 1813   05/22/24 1814  Expectorated Sputum Assessment w Gram Stain, Rflx to Resp Cult  (COPD / Pneumonia / Cellulitis / Lower Extremity Wound (Diabetic Foot Infection))  Once,   R        05/22/24 1813   05/22/24 1814  Legionella Pneumophila Serogp 1 Ur Ag  (COPD / Pneumonia / Cellulitis / Lower Extremity Wound (Diabetic Foot Infection))  Once,   R        05/22/24 1813   05/22/24 1814  Strep pneumoniae urinary antigen  (  COPD / Pneumonia / Cellulitis / Lower Extremity Wound (Diabetic Foot Infection))  Once,   R        05/22/24 1813           Antimicrobials/Microbiology: Anti-infectives (From admission, onward)    Start     Dose/Rate Route Frequency Ordered Stop    05/24/24 1000  doxycycline  (VIBRA -TABS) tablet 100 mg        100 mg Oral Every 12 hours 05/24/24 0731     05/23/24 1800  ceFEPIme  (MAXIPIME ) 2 g in sodium chloride  0.9 % 100 mL IVPB        2 g 200 mL/hr over 30 Minutes Intravenous Every 24 hours 05/22/24 1905     05/23/24 0215  vancomycin  (VANCOREADY) IVPB 1250 mg/250 mL        1,250 mg 166.7 mL/hr over 90 Minutes Intravenous  Once 05/23/24 0121 05/23/24 0349   05/22/24 1905  vancomycin  variable dose per unstable renal function (pharmacist dosing)  Status:  Discontinued         Does not apply See admin instructions 05/22/24 1906 05/24/24 0731   05/22/24 1745  ceFEPIme  (MAXIPIME ) 2 g in sodium chloride  0.9 % 100 mL IVPB        2 g 200 mL/hr over 30 Minutes Intravenous  Once 05/22/24 1734 05/22/24 1818   05/22/24 1730  vancomycin  (VANCOCIN ) IVPB 1000 mg/200 mL premix  Status:  Discontinued        1,000 mg 200 mL/hr over 60 Minutes Intravenous  Once 05/22/24 1729 05/23/24 0121         Component Value Date/Time   SDES BLOOD LEFT ARM 05/22/2024 1604   SPECREQUEST  05/22/2024 1604    BOTTLES DRAWN AEROBIC AND ANAEROBIC Blood Culture adequate volume   CULT  05/22/2024 1604    NO GROWTH 3 DAYS Performed at [facility], [address] [place], [place] [address]    REPTSTATUS PENDING 05/22/2024 8395    Procedures:    Mennie LAMY, MD [facility] 05/25/2024, 9:51 AM

## 2024-05-25 NOTE — Evaluation (Signed)
 Physical Therapy Evaluation  Patient Details Name: [name] MRN: [id number] DOB: 05/11/1946 Today's Date: 05/25/2024  History of Present Illness  JARVIS KNODEL is a 78 yo female who presents 05/22/2024 with worsening shortness of breath, coughing and blood-tinged sputum.  Admitted for PNA. 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   Clinical Impression  Pt admitted with above diagnosis. Pt currently with functional limitations due to the deficits listed below (see PT Problem List). At the time of PT eval pt was able to perform transfers and ambulation with gross min assist to CGA and RW for support. Pt on RA for a portion of session and SpO2 dropped to 81% after ambulation on RA. Sats recovered up to 95% on 3L/min supplemental O2 at rest, but pt unable to maintain, with sats again dropping to 86% on 3L/min supplemental O2 during repeat ambulation trial. Pt will benefit from acute skilled PT to increase their independence and safety with mobility to allow discharge.           If plan is discharge home, recommend the following: A little help with walking and/or transfers;A little help with bathing/dressing/bathroom;Assistance with cooking/housework;Assist for transportation;Help with stairs or ramp for entrance   Can travel by private vehicle        Equipment Recommendations None recommended by PT  Recommendations for Other Services       Functional Status Assessment Patient has had a recent decline in their functional status and demonstrates the ability to make significant improvements in function in a reasonable and predictable amount of time.     Precautions / Restrictions Precautions Precautions: Fall Recall of Precautions/Restrictions: Intact Precaution/Restrictions Comments: Watch  O2 Restrictions Weight Bearing Restrictions Per Provider Order: No      Mobility  Bed Mobility Overal bed mobility: Needs Assistance Bed Mobility: Supine to Sit     Supine to sit: Contact guard     General bed mobility comments: Close guard as pt transitioned to EOB.    Transfers Overall transfer level: Needs assistance Equipment used: Rolling walker (2 wheels) Transfers: Sit to/from Stand Sit to Stand: Contact guard assist           General transfer comment: VC's for hand placement on seated surface for safety. No assist required but hands on guarding provided for safety.    Ambulation/Gait Ambulation/Gait assistance: Contact guard assist, Min assist Gait Distance (Feet): 40 Feet (25'+40') Assistive device: Rolling walker (2 wheels) Gait Pattern/deviations: Step-through pattern, Decreased stride length, Trunk flexed Gait velocity: Decreased Gait velocity interpretation: 1.31 - 2.62 ft/sec, indicative of limited community ambulator   General Gait Details: VC's for improved posture, closer walker proximity and forward gaze. Occasional assist for walker management but no overt LOB noted.  Stairs            Wheelchair Mobility     Tilt Bed    Modified Rankin (Stroke Patients Only)       Balance Overall balance assessment: Needs assistance Sitting-balance support: Feet supported Sitting balance-Leahy Scale: Good     Standing balance support: Bilateral upper extremity supported, During functional activity Standing balance-Leahy Scale: Poor                               Pertinent Vitals/Pain Pain Assessment Pain Assessment: Faces Faces  Pain Scale: Hurts a little bit Pain Location: back Pain Descriptors / Indicators: Discomfort, Grimacing Pain Intervention(s): Limited activity within patient's tolerance, Monitored during session, Repositioned    Home Living Family/patient expects to be discharged to:: Private residence Living  Arrangements: Alone Available Help at Discharge: Family;Friend(s);Available PRN/intermittently Type of Home: House Home Access: Stairs to enter Entrance Stairs-Rails: Right;Left Entrance Stairs-Number of Steps: 2   Home Layout: One level Home Equipment: Agricultural consultant (2 wheels);Tub bench Additional Comments: Son and daughter-in-law live nearby.; Reports typically sleeping on the couch    Prior Function Prior Level of Function : Independent/Modified Independent;Needs assist             Mobility Comments: RW for most mobility, denies falls ADLs Comments: mod I, sleeps on couch     Extremity/Trunk Assessment   Upper Extremity Assessment Upper Extremity Assessment: Defer to OT evaluation    Lower Extremity Assessment Lower Extremity Assessment: Generalized weakness    Cervical / Trunk Assessment Cervical / Trunk Assessment: Kyphotic  Communication   Communication Communication: No apparent difficulties    Cognition Arousal: Alert Behavior During Therapy: WFL for tasks assessed/performed   PT - Cognitive impairments: No apparent impairments                         Following commands: Intact       Cueing Cueing Techniques: Verbal cues     General Comments General comments (skin integrity, edema, etc.): Pt on RA for a portion of session and pt desaturating into low 80's.    Exercises     Assessment/Plan    PT Assessment Patient needs continued PT services  PT Problem List Decreased strength;Decreased activity tolerance;Decreased balance;Decreased mobility;Decreased knowledge of use of DME;Decreased safety awareness;Decreased knowledge of precautions;Pain       PT Treatment Interventions DME instruction;Gait training;Functional mobility training;Therapeutic activities;Therapeutic exercise;Balance training;Patient/family education    PT Goals (Current goals can be found in the Care Plan section)  Acute Rehab PT Goals Patient Stated Goal: not be on  home O2; at d/c be able to go out when she wants to. PT Goal Formulation: With patient Time For Goal Achievement: 06/01/24 Potential to Achieve Goals: Good Additional Goals Additional Goal #1: Pt will be able to maintain SpO2 >90% during ambulation >120' at a time.    Frequency Min 2X/week     Co-evaluation               AM-PAC PT 6 Clicks Mobility  Outcome Measure Help needed turning from your back to your side while in a flat bed without using bedrails?: A Little Help needed moving from lying on your back to sitting on the side of a flat bed without using bedrails?: A Little Help needed moving to and from a bed to a chair (including a wheelchair)?: A Little Help needed standing up from a chair using your arms (e.g., wheelchair or bedside chair)?: A Little Help needed to walk in hospital room?: A Little Help needed climbing 3-5 steps with a railing? : A Lot 6 Click Score: 17    End of Session Equipment Utilized During Treatment: Gait belt;Oxygen Activity Tolerance: Patient tolerated treatment well Patient left: in chair;with call bell/phone within reach;with chair alarm set Nurse Communication: Mobility status PT Visit Diagnosis: Unsteadiness on feet (R26.81);Muscle weakness (generalized) (M62.81);Difficulty in walking, not elsewhere classified (R26.2)    Time: 8869-8841 PT Time Calculation (min) (ACUTE ONLY): 28 min   Charges:   PT Evaluation $PT  Eval Moderate Complexity: 1 Mod PT Treatments $Gait Training: 8-22 mins PT General Charges $$ ACUTE PT VISIT: 1 Visit         Leita Sable, PT, DPT Acute Rehabilitation Services Secure Chat Preferred Office: [phone]   [name] 05/25/2024, 1:40 PM

## 2024-05-25 NOTE — Care Management Important Message (Signed)
 Important Message  Patient Details  Name: [name] MRN: [id number] Date of Birth: Jul 05, 1946   Important Message Given:  Yes - Medicare IM     Jon Cruel 05/25/2024, 12:01 PM

## 2024-05-25 NOTE — Plan of Care (Signed)
  Problem: Skin Integrity: Goal: Risk for impaired skin integrity will decrease Outcome: Progressing   Problem: Clinical Measurements: Goal: Will remain free from infection Outcome: Progressing   Problem: Activity: Goal: Risk for activity intolerance will decrease Outcome: Progressing

## 2024-05-25 NOTE — Progress Notes (Signed)
 SATURATION QUALIFICATIONS: (This note is used to comply with regulatory documentation for home oxygen)  Patient Saturations on Room Air at Rest = 86%  Patient Saturations on Room Air while Ambulating = 81%  Patient Saturations on 3 Liters of oxygen while Ambulating = 86%  Please briefly explain why patient needs home oxygen: Pt is currently unable to maintain O2 sats >86% on 3L.min supplemental O2 during ambulation.   See full PT note for more detail.    Leita Sable, PT, DPT Acute Rehabilitation Services Secure Chat Preferred Office: [phone]

## 2024-05-26 DIAGNOSIS — J189 Pneumonia, unspecified organism: Secondary | ICD-10-CM | POA: Diagnosis not present

## 2024-05-26 LAB — CBC
HCT: 29.7 % — ABNORMAL LOW (ref 36.0–46.0)
Hemoglobin: 9.7 g/dL — ABNORMAL LOW (ref 12.0–15.0)
MCH: 28.3 pg (ref 26.0–34.0)
MCHC: 32.7 g/dL (ref 30.0–36.0)
MCV: 86.6 fL (ref 80.0–100.0)
Platelets: 225 K/uL (ref 150–400)
RBC: 3.43 MIL/uL — ABNORMAL LOW (ref 3.87–5.11)
RDW: 15.5 % (ref 11.5–15.5)
WBC: 10.9 K/uL — ABNORMAL HIGH (ref 4.0–10.5)
nRBC: 0 % (ref 0.0–0.2)

## 2024-05-26 LAB — BASIC METABOLIC PANEL WITH GFR
Anion gap: 10 (ref 5–15)
BUN: 27 mg/dL — ABNORMAL HIGH (ref 8–23)
CO2: 23 mmol/L (ref 22–32)
Calcium: 9 mg/dL (ref 8.9–10.3)
Chloride: 103 mmol/L (ref 98–111)
Creatinine, Ser: 1.7 mg/dL — ABNORMAL HIGH (ref 0.44–1.00)
GFR, Estimated: 31 mL/min — ABNORMAL LOW (ref >60)
Glucose, Bld: 89 mg/dL (ref 70–99)
Potassium: 3.9 mmol/L (ref 3.5–5.1)
Sodium: 136 mmol/L (ref 135–145)

## 2024-05-26 LAB — GLUCOSE, CAPILLARY
Glucose-Capillary: 89 mg/dL (ref 70–99)
Glucose-Capillary: 105 mg/dL — ABNORMAL HIGH (ref 70–99)
Glucose-Capillary: 97 mg/dL (ref 70–99)
Glucose-Capillary: 147 mg/dL — ABNORMAL HIGH (ref 70–99)

## 2024-05-26 MED ORDER — FUROSEMIDE 20 MG PO TABS
20.0000 mg | ORAL_TABLET | Freq: Once | ORAL | Status: AC
  Administered 2024-05-26 (×2): 20 mg via ORAL
  Filled 2024-05-26: qty 1

## 2024-05-26 NOTE — TOC Initial Note (Signed)
 Transition of Care [facility]) - Initial/Assessment Note    Patient Details  Name: [name] MRN: [id number] Date of Birth: 01-30-1946  Transition of Care [facility] CM/SW Contact:    Rosalva Jon Bloch, RN Phone Number: 05/26/2024, 2:48 PM  Clinical Narrative:     Presents with worsening shortness of breath, coughing and blood-tinged sputum.   From home alone. Son and DIL live close by., very supportive. PTA independent with ADL's. Uses RW with ambulation. Pt without RX med concerns or transportation issues. Family to provide transportation to home once d/c ready.  PCP confirmed: Yolande Toribio MATSU, MD.  Pt agreeable to home health services per therapy recommendations. Preference: [facility]. Referral made with [facility] and accepted pending MD's order. No DME needs noted.  TOC team following and will assist with needs...   Expected Discharge Plan: Home w Home Health Services Barriers to Discharge: Continued Medical Work up   Patient Goals and CMS Choice            Expected Discharge Plan and Services   Discharge Planning Services: CM Consult   Living arrangements for the past 2 months: Single Family Home                           HH Arranged: PT, OT HH Agency: [facility] Date [facility] Agency Contacted: 05/26/24 Time HH Agency Contacted: [phone] Representative spoke with at [facility] Agency: Arna  Prior Living Arrangements/Services Living arrangements for the past 2 months: Single Family Home Lives with:: Self Patient language and need for interpreter reviewed:: Yes Do you feel safe going back to the place where you live?: Yes      Need for Family Participation in Patient Care: Yes (Comment) Care giver support system in place?: Yes (comment) Current home services: DME (RW) Criminal Activity/Legal Involvement Pertinent to Current Situation/Hospitalization: No - Comment as needed  Activities of Daily Living   ADL Screening (condition at time of  admission) Independently performs ADLs?: Yes (appropriate for developmental age) Is the patient deaf or have difficulty hearing?: No Does the patient have difficulty seeing, even when wearing glasses/contacts?: No Does the patient have difficulty concentrating, remembering, or making decisions?: No  Permission Sought/Granted   Permission granted to share information with : Yes, Verbal Permission Granted  Share Information with NAME: Willean Schurman  Son  Emergency Contact  [phone]           Emotional Assessment       Orientation: : Oriented to Self, Oriented to Place, Oriented to  Time, Oriented to Situation Alcohol  / Substance Use: Not Applicable Psych Involvement: No (comment)  Admission diagnosis:  Hypoxia [R09.02] Multifocal pneumonia [J18.9] Community acquired pneumonia, unspecified laterality [J18.9] Patient Active Problem List   Diagnosis Date Noted   Multifocal pneumonia 05/22/2024   Memory impairment 04/09/2024   Lower GI bleed 07/01/2023   History of coronary angioplasty with insertion of stent 07/01/2023   History of stroke in prior 3 months 07/01/2023   Coronary artery disease involving native coronary artery of native heart without angina pectoris 07/01/2023   Acute respiratory failure with hypoxia (HCC) 06/04/2023   Confusion 04/15/2023   Rhabdomyolysis 04/15/2023   Unstable angina (HCC) 01/29/2023   TIA (transient ischemic attack) 01/02/2023   Acute CVA (cerebrovascular accident) (HCC) 04/15/2022   Hypertensive urgency 04/09/2022   History of seizures as a child 04/09/2022   Chronic combined systolic and diastolic CHF (congestive heart failure) (HCC) 04/09/2022  Pyuria 04/09/2022   CKD (chronic kidney disease) stage 4, GFR 15-29 ml/min (HCC) 04/09/2022   Sepsis (HCC) 12/26/2021   UTI (urinary tract infection) 12/26/2021   Aspiration pneumonia (HCC) 12/26/2021   Acute renal failure superimposed on stage 4 chronic kidney disease (HCC) 12/26/2021    Acute on chronic systolic CHF (congestive heart failure) (HCC) 12/26/2021   Stroke (HCC) 12/25/2021   Pharyngeal dysphagia 07/12/2021   Pressure ulcer of sacral region 07/12/2021   Wound of abdomen 07/12/2021   Closed fracture of distal end of left radius 06/15/2021   Degeneration of lumbar intervertebral disc 04/23/2021   Hyponatremia 11/16/2020   Metabolic acidosis, NAG, failure of bicarbonate regeneration 11/16/2020   Nausea & vomiting 11/16/2020   Acute-on-chronic kidney injury (HCC) 11/16/2020   Abdominal aortic atherosclerosis (HCC) 06/01/2020   Acquired thrombophilia (HCC) 06/01/2020   Insomnia 05/08/2020   Neck pain 01/11/2020   Referred otalgia of both ears 01/11/2020   Frequency of micturition 12/16/2019   Orthostatic hypotension 12/16/2019   Urinary tract infectious disease 12/16/2019   Pneumonia due to coronavirus disease 2019 11/17/2019   Acute combined systolic and diastolic heart failure (HCC) 10/29/2019   COVID-19 10/29/2019   Hyperkalemia 10/16/2019   CAP (community acquired pneumonia) 10/16/2019   Acute metabolic encephalopathy 10/15/2019   Noninfective gastroenteritis and colitis, unspecified 09/01/2019   Hypo-osmolality and hyponatremia 08/04/2019   Spasm 06/03/2019   Seizures (HCC) 01/07/2019   Cerebrovascular accident (CVA) due to occlusion of left carotid artery (HCC) 01/07/2019   CAD, multiple vessel 01/07/2019   H/O ischemic bowel disease 01/07/2019   Protein calorie malnutrition (HCC) 01/04/2019   Acute bronchitis 12/04/2018   Oral phase dysphagia 12/04/2018   Personal history of transient ischemic attack (TIA), and cerebral infarction without residual deficits 11/11/2018   CKD (chronic kidney disease) stage 3, GFR 30-59 ml/min 11/04/2018   Seizure (HCC) 11/03/2018   Anorectal disorder 06/08/2018   Burning tongue syndrome 04/15/2018   Localized edema 03/02/2018   Sinus tachycardia 03/02/2018   Dermatitis 02/12/2018   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  Ileostomy 10/18/2017 10/14/2017   Ischemic colitis (HCC) 10/14/2017   Acute respiratory failure with hypoxemia (HCC) 10/14/2017   Acute post-operative pain 10/14/2017   Acute kidney injury superimposed on CKD (HCC) 10/14/2017   Anemia 10/14/2017   Benign paroxysmal positional vertigo 07/29/2017   Nocturia more than twice per night 05/07/2017   Diabetes due to underlying condition w oth circulatory comp (HCC) 05/07/2017   PVD (peripheral vascular disease) (HCC) 05/07/2017   Poor sleep hygiene 05/07/2017   Paradoxical insomnia 05/07/2017   Abnormal feces 04/10/2017   Hilar lymphadenopathy    Hemoptysis 02/19/2017   Pulmonary nodules/lesions, multiple 02/19/2017   Tobacco use 02/19/2017   Disorder of arteries and arterioles, unspecified (HCC) 01/31/2017   Lung field abnormal 01/31/2017   RUQ abdominal pain 01/13/2017   Restless legs syndrome 11/28/2016   Left-sided extracranial carotid artery occlusion 11/26/2016   PAOD (peripheral arterial occlusive disease) (HCC) 11/26/2016   Encounter for routine follow-up after surgery of the circulatory system 11/26/2016   Abnormal weight loss 01/29/2016   Allergic rhinitis 01/29/2016   Chest pain with moderate risk of acute coronary syndrome 10/27/2015   Other slipping, tripping and stumbling without falling, initial encounter 07/28/2015   Acid indigestion 12/31/2013  Pain in left hip 08/26/2013   Screening for gout 05/13/2013   Disorder of skin or subcutaneous tissue 12/22/2012   Difficult or painful urination 09/02/2012   Peripheral blood vessel disorder (HCC) 05/11/2012   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   Follow-up examination following surgery 05/11/2012   Occlusion and stenosis of carotid artery without  mention of cerebral infarction 04/14/2012   Fatigue 09/24/2011   Gonalgia 07/04/2011   Avitaminosis D 05/02/2011   Chronic pancreatitis (HCC) 04/30/2011   Vitamin B12 deficiency 04/30/2011   Intestinal motility disorder 04/30/2011   Fatty liver 04/30/2011   Adiposity 04/30/2011   Other and unspecified general psychiatric examination 04/26/2011   Abdominal pain 04/09/2011   Iron  deficiency anemia, unspecified  04/09/2011   Other general symptoms  04/09/2011   Asthma 04/09/2011   Type 2 diabetes mellitus with diabetic peripheral angiopathy without gangrene (HCC) 01/24/2011   Abnormal weight gain 11/30/2010   Dyslipidemia    Vertigo    Right-sided extracranial carotid artery stenosis    Normal nuclear stress test    Feeling bilious 07/11/2010   Underimmunization status 07/11/2010   Pancreas (digestive gland) works poorly 05/30/2010   B12 deficiency 03/19/2010   DVT 03/16/2010   BLIND LOOP SYNDROME 03/16/2010   Diarrhea 03/16/2010   Chronic depression 02/23/2010   Major depression, single episode 02/23/2010   Cough 11/17/2009   Plantar verruca 11/17/2009   Coronary artery disease involving coronary bypass graft of native heart without angina pectoris 11/16/2009   Claudication (HCC) 11/16/2009   Compulsive tobacco user syndrome 11/16/2009   HLD (hyperlipidemia) 11/16/2009   Nontoxic single thyroid  nodule 11/16/2009   Obstructive apnea 11/16/2009   Radial styloid tenosynovitis 11/16/2009   Low back pain 11/16/2009   Gastroparesis 11/16/2009   LAXATIVE ABUSE 12/17/2007   Accelerated hypertension 12/17/2007   Acid reflux 12/17/2007   HIATAL HERNIA 12/17/2007   IBS 12/17/2007   MELANOSIS COLI 12/17/2007   ARTHRITIS 12/17/2007   HEPATOMEGALY 12/17/2007   PCP:  Yolande Toribio MATSU, MD Pharmacy:   [id number] [place], [place] - [address] [place] [address] Phone: [phone] Fax: [phone]     Social  Drivers of Health (SDOH) Social History: SDOH Screenings   Food Insecurity: No Food Insecurity (05/23/2024)  Housing: Patient Declined (05/23/2024)  Transportation Needs: No Transportation Needs (05/23/2024)  Utilities: Not At Risk (05/23/2024)  Social Connections: Unknown (05/23/2024)  Tobacco Use: Medium Risk (05/22/2024)   SDOH Interventions:     Readmission Risk Interventions    07/01/2023   12:56 PM 06/27/2023    8:42 AM 06/04/2023    3:49 PM  Readmission Risk Prevention Plan  Transportation Screening Complete Complete Complete  Medication Review (RN Care Manager)   Referral to Pharmacy  PCP or Specialist appointment within 3-5 days of discharge   Complete  HRI or Home Care Consult   Complete  SW Recovery Care/Counseling Consult   Complete  Palliative Care Screening   Not Applicable  Skilled Nursing Facility   Not Applicable

## 2024-05-26 NOTE — Addendum Note (Signed)
 Addended by: CLAUDENE NAOMIE SAILOR on: 05/26/2024 10:39 AM   Modules accepted: Orders

## 2024-05-26 NOTE — Progress Notes (Signed)
 PROGRESS NOTE [name]  FMW:[id number] DOB: [birth year] DOA: 05/22/2024 PCP: Yolande Toribio MATSU, MD  Brief Narrative/Hospital Course: 78 year old female with hypertension, chronic combined systolic and diastolic CHF, asthma/COPD, anemia, chronic pancreatitis, depression, type 2 diabetes with neuropathy, PVD with recently diagnosed pneumonia a week ago, and has completed antibiotics (cefdinir) presenting with worsening shortness of breath, coughing and blood-tinged sputum.  As per ED patient's son also reports patient has been little confused over the last few days. In the ZI:Yzfnibwjfprjoob stable although mild hypoxic needing supplemental oxygen,  labs with mild hyponatremia creatinine 2.4 BUN 31 normal LFTs lactic acid normal 1.4 labs with a leukocytosis 36.6 hemoglobin 10.3 COVID influenza RSV negative. Cxr>multifocal pneumonia right lung, aortic atherosclerosis and emphysema. Blood culture sent, given vancomycin  cefepime  and morphine , admission requested. She c/o leg swelling worse from baseline her normaL weight in around 153lb, weight is pending in ED.  Subjective: Seen and examined Patient reports she is feeling better overall needing supplemental oxygen up to 3 L-hypoxic up to 84% on ambulation Overnight afebrile BP stable BP 130s-170s Labs shows further improvement in WBC 10.9, creatinine at 1.7 previously up to 2.4  Assessment and plan:  Sepsis POA Multifocal pneumonia Acute hypoxic respiratory failure:  Recently completed antibiotics and failed outpatient treatment (  cefdinir) admitted with sepsis/multifocal pneumonia -remains hemodynamically stable leukocytosis resolved. Blood culture NGTD.  Urine antigen strep negative. MRSA PCR negative> antibiotic de-escalated to cefepime  and doxycycline . Still hypoxic needing supplemental oxygen, will try Lasix  x 1 today she may need to go home with oxygen next 24 hours TOC qualifying her for oxygen  D-dimer positive: VQ scan negative,  bilateral lower extremity Dopplers negative for DVT  AKI on CKD stage 4 Metabolic acidosis: B/L Creat ~ 2.1 on  09/2023.  Creatinine has improved around 1.5-1.7 from peak of 2.4.  Encourage oral hydration.   Recent Labs    06/29/23 0937 07/01/23 0621 07/02/23 0338 10/03/23 1019 05/22/24 1604 05/22/24 1611 05/23/24 0813 05/23/24 1854 05/24/24 0700 05/25/24 0544 05/26/24 0431  BUN 32* 28* 27* 37* 31*  --  36* 35* 29* 25* 27*  CREATININE 1.40* 1.70* 1.74* 2.10* 2.41*  --  2.07* 2.27* 1.84* 1.57* 1.70*  CO2 17* 20* 21* 22 20*  --  14* 22 23 22 23   K 4.5 4.2 4.4 4.1 3.8   < > 4.4 3.8 3.5 3.9 3.9   < > = values in this interval not displayed.    COPD Asthma: No wheezing, continue inhalers, Singulair .  Carotid artery disease PVD : Cont Plavix  and statin  Hypertension Chronic diastolic CHF Nonsustained V. tach Leg swelling worse from baseline Hypomagnesemia: Echo updated 8/20 EF normal 55-60, G1 DD RV size normal, MV-degenerative, AV not well-visualized. BNP on admission 176. Has chronic appearing leg swelling and Blood pressure poorly controlled here amlodipine  resumed at 10 mg,on p.o. Lasix  20, give additional Lasix  20 mg x 1 for hypoxia. Cont Toprol  100.No longer on Imdur  cont po prn hydralazine  prn.  Monitor electrolytes  Chronic knee pain and ambulatory dysfunction On Chronic norco: Continue hydrocodone  at lower dose. She chronically takes it As per daughter in law/son,  she is mostly bedbound by her choice but she is ambulatory  Cont PT OT   Chronic diarrhea Chronic pancreatitis: Cont lomotil  prn  Dyslipidemia cont Lipitor.  Chronic depression: Continue home doxepin , Remeron , Effexor   Chronic Rash/eczema: Appears generalized on extremities and back with excoriation, going on for 3 months. 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 off isolation.  Discussed  with infectious disease  Seizure disorder: continue home Keppra   IDA/anemia of CKD: baseline 10 to 11 g.  Remains overall stable Recent Labs  Lab 05/22/24 1611 05/23/24 0813 05/24/24 0700 05/25/24 0544 05/26/24 0431  HGB 11.2* 9.5* 9.9* 9.5* 9.7*  HCT 33.0* 30.7* 31.1* 29.4* 29.7*    T2DM with diabetic peripheral angiopathy without gangrene: Blood sugar well-controlled sliding scale  Recent Labs  Lab 05/22/24 2153 05/23/24 1142 05/25/24 1115 05/25/24 1623 05/25/24 2038 05/25/24 2131 05/26/24 0638  GLUCAP  --    < > 144* 95 113* 100* 89  HGBA1C 6.1*  --   --   --   --   --   --    < > = values in this interval not displayed.     Class I Obesity w/ Body mass index is 30.87 kg/m.: OSA: Will benefit with PCP follow-up, weight loss,healthy lifestyle.  Patient is noncompliant with CPAP as per home Patient is sedentary and not much mobile. She is high risk for readmission. She will benefit palliative care follow-up as outpatient  Mobility: PT Orders: Active PT Follow up Rec: Home Health Pt8/09/2024 1324    DVT prophylaxis: enoxaparin  (LOVENOX ) injection 30 mg Start: 05/22/24 2000 SCDs Start: 05/22/24 1814 Code Status:   Code Status: Full Code Family Communication: plan of care discussed with patient at bedside.  I had called and updated patient's son 8/10, and daughter-in-law updated on 8/13. Patient status is: Remains hospitalized because of severity of illness Level of care: Telemetry Medical   Dispo: The patient is from: home alone, son checks on her             Anticipated disposition: Anticipating discharge in day or 2 if hypoxia improves further   Objective: Vitals last 24 hrs: Vitals:   05/25/24 2041 05/26/24 0400 05/26/24 0800 05/26/24 0816  BP: (!) 133/52 (!) 168/72 (!) 178/68   Pulse: 80 83 90 89  Resp:   19 18  Temp: 97.7 F (36.5 C) 98 F (36.7 C) 98.2 F (36.8 C)   TempSrc: Oral Oral Oral   SpO2: 91% 98% 93% 95%  Weight:      Height:         Physical Examination: General exam: AAOX3 HEENT:Oral mucosa moist, Ear/Nose WNL grossly Respiratory system: B/L diminished,no use of accessory muscle Cardiovascular system: S1 & S2 +, No JVD. Gastrointestinal system: Abdomen soft,NT,ND, BS+ Nervous System: Alert, awake, moving all extremities,and following commands. Extremities: LE edema NEG, rashes+ distal extremities warm.  Skin: Excoriated rashes on upper back  MSK: Normal muscle bulk,tone, power   Medications reviewed:  Scheduled Meds:  amLODipine   10 mg Oral Daily   atorvastatin   40 mg Oral Daily   budesonide -glycopyrrolate -formoterol   2 puff Inhalation BID   clopidogrel   75 mg Oral Daily   cyanocobalamin   500 mcg Oral Daily   doxepin   10 mg Oral QHS   doxycycline   100 mg Oral Q12H   enoxaparin  (LOVENOX ) injection  30 mg Subcutaneous Q24H   furosemide   20 mg Oral Daily   guaiFENesin   600 mg Oral BID   insulin  aspart  0-5 Units Subcutaneous QHS   insulin  aspart  0-9 Units Subcutaneous TID WC   iron  polysaccharides  150 mg Oral Daily   levETIRAcetam   500 mg Oral BID   metoprolol  succinate  100 mg Oral Daily   mirtazapine   15 mg Oral QHS   montelukast   10 mg Oral Daily   mupirocin  ointment  1 Application Topical BID   pantoprazole   40 mg Oral BID   venlafaxine  XR  75 mg Oral Daily   Continuous Infusions:  ceFEPime  (MAXIPIME ) IV 2 g (05/25/24 1752)   Diet: Diet Order             Diet Carb Modified Fluid consistency: Thin; Room service appropriate? Yes; Fluid restriction: 1800 mL Fluid  Diet effective now                    Data Reviewed: I have personally reviewed following labs and imaging studies ( see epic result tab) CBC: Recent Labs  Lab 05/22/24 1604 05/22/24 1611 05/23/24 0813 05/24/24 0700 05/25/24 0544 05/26/24 0431  WBC 36.6*  --  32.8* 20.5* 14.1* 10.9*  NEUTROABS 30.7*  --   --   --   --   --   HGB 10.3* 11.2* 9.5* 9.9* 9.5* 9.7*  HCT 32.2* 33.0* 30.7* 31.1* 29.4* 29.7*  MCV 88.5  --   91.4 87.9 86.2 86.6  PLT 236  --  197 213 219 225   CMP: Recent Labs  Lab 05/23/24 0813 05/23/24 1854 05/24/24 0659 05/24/24 0700 05/25/24 0544 05/26/24 0431  NA 134* 137  --  138 134* 136  K 4.4 3.8  --  3.5 3.9 3.9  CL 105 104  --  105 102 103  CO2 14* 22  --  23 22 23   GLUCOSE 93 112*  --  87 86 89  BUN 36* 35*  --  29* 25* 27*  CREATININE 2.07* 2.27*  --  1.84* 1.57* 1.70*  CALCIUM  8.1* 8.1*  --  8.4* 8.6* 9.0  MG  --   --  1.1*  --   --   --    GFR: Estimated Creatinine Clearance: 25.9 mL/min (A) (by C-G formula based on SCr of 1.7 mg/dL (H)). Recent Labs  Lab 05/22/24 1604  AST 36  ALT 15  ALKPHOS 105  BILITOT 1.0  PROT 5.9*  ALBUMIN  2.5*   No results for input(s): LIPASE, AMYLASE in the last 168 hours. No results for input(s): AMMONIA in the last 168 hours. Coagulation Profile: No results for input(s): INR, PROTIME in the last 168 hours. Unresulted Labs (From admission, onward)     Start     Ordered   05/29/24 0500  Creatinine, serum  (enoxaparin  (LOVENOX )    CrCl < 30 ml/min)  Once,   R       Comments: while on enoxaparin  therapy.    05/22/24 1813   05/24/24 0729  C Difficile Quick Screen w PCR reflex  (C Difficile quick screen w PCR reflex panel )  Once, for 24 hours,   TIMED       References:    CDiff Information Tool   05/24/24 0728   05/23/24 0500  Basic metabolic panel with GFR  Daily,   R      05/22/24 1813   05/23/24 0500  CBC  Daily,   R      05/22/24 1813   05/22/24 1814  Expectorated Sputum Assessment w Gram Stain, Rflx to Resp Cult  (COPD / Pneumonia / Cellulitis / Lower Extremity Wound (Diabetic Foot Infection))  Once,   R        05/22/24 1813   05/22/24 1814  Strep pneumoniae urinary antigen  (COPD / Pneumonia / Cellulitis / Lower Extremity Wound (  Diabetic Foot Infection))  Once,   R        05/22/24 1813           Antimicrobials/Microbiology: Anti-infectives (From admission, onward)    Start     Dose/Rate Route Frequency  Ordered Stop   05/24/24 1000  doxycycline  (VIBRA -TABS) tablet 100 mg        100 mg Oral Every 12 hours 05/24/24 0731     05/23/24 1800  ceFEPIme  (MAXIPIME ) 2 g in sodium chloride  0.9 % 100 mL IVPB        2 g 200 mL/hr over 30 Minutes Intravenous Every 24 hours 05/22/24 1905     05/23/24 0215  vancomycin  (VANCOREADY) IVPB 1250 mg/250 mL        1,250 mg 166.7 mL/hr over 90 Minutes Intravenous  Once 05/23/24 0121 05/23/24 0349   05/22/24 1905  vancomycin  variable dose per unstable renal function (pharmacist dosing)  Status:  Discontinued         Does not apply See admin instructions 05/22/24 1906 05/24/24 0731   05/22/24 1745  ceFEPIme  (MAXIPIME ) 2 g in sodium chloride  0.9 % 100 mL IVPB        2 g 200 mL/hr over 30 Minutes Intravenous  Once 05/22/24 1734 05/22/24 1818   05/22/24 1730  vancomycin  (VANCOCIN ) IVPB 1000 mg/200 mL premix  Status:  Discontinued        1,000 mg 200 mL/hr over 60 Minutes Intravenous  Once 05/22/24 1729 05/23/24 0121         Component Value Date/Time   SDES BLOOD LEFT ARM 05/22/2024 1604   SPECREQUEST  05/22/2024 1604    BOTTLES DRAWN AEROBIC AND ANAEROBIC Blood Culture adequate volume   CULT  05/22/2024 1604    NO GROWTH 4 DAYS Performed at [facility], [address] [place], [place] [address]    REPTSTATUS PENDING 05/22/2024 8395    Procedures:    Mennie LAMY, MD [facility] 05/26/2024, 11:21 AM

## 2024-05-26 NOTE — Plan of Care (Signed)

## 2024-05-26 NOTE — Progress Notes (Signed)
 Mobility Specialist Progress Note:   05/26/24 1000  Mobility  Activity Ambulated with assistance  Level of Assistance Contact guard assist, steadying assist (chair follow)  Assistive Device Front wheel walker  Distance Ambulated (ft) 30 ft  Activity Response Tolerated well  Mobility Referral Yes  Mobility visit 1 Mobility  Mobility Specialist Start Time (ACUTE ONLY) 1010  Mobility Specialist Stop Time (ACUTE ONLY) 1028  Mobility Specialist Time Calculation (min) (ACUTE ONLY) 18 min   Pt received in bed agreeable to mobility. Attempted to have patient on RA but d/t desating at rest to 84%, O2 flow increased to 3L/min. Pt had an episode of BM incontinence requiring assistance. Attempted to keep pt ambulating in 3L/min but pt desated, increased to 4L/min during ambulation. Pt required chair follow d/t fatigue. Wheeled back in room and left in chair w/ call bell and personal belongings in reach. Left on 3L/min. All needs met.  Pre Mobility SPO2 RA 84%                     SPO2 3L/min 93% During Mobility SPO2 3L/min 85%                      SPO2 4L/min 92% Post Mobility SPO2 3L/min 95%  Thersia Minder Mobility Specialist  Please contact vis Secure Chat or  Rehab Office [phone]

## 2024-05-26 NOTE — Progress Notes (Signed)
 Mobility Specialist Progress Note:  Nurse requested Mobility Specialist to perform oxygen saturation test with pt which includes removing pt from oxygen both at rest and while ambulating.  Below are the results from that testing.     Patient Saturations on Room Air at Rest = spO2 84% O2 flow increased to 3L/min to keep SPO2 above 88%   Patient Saturations on Room Air while Ambulating = sp02 N/A% .   Patient Saturations on 4 Liters of oxygen while Ambulating = sp02 92%  At end of testing pt left in room on 3  Liters of oxygen.  Reported results to nurse.    Thersia Minder Mobility Specialist  Please contact vis Secure Chat or  Rehab Office [phone]

## 2024-05-27 DIAGNOSIS — J189 Pneumonia, unspecified organism: Secondary | ICD-10-CM | POA: Diagnosis not present

## 2024-05-27 LAB — CULTURE, BLOOD (ROUTINE X 2)
Culture: NO GROWTH
Culture: NO GROWTH
Special Requests: ADEQUATE
Special Requests: ADEQUATE

## 2024-05-27 LAB — GLUCOSE, CAPILLARY
Glucose-Capillary: 88 mg/dL (ref 70–99)
Glucose-Capillary: 115 mg/dL — ABNORMAL HIGH (ref 70–99)
Glucose-Capillary: 176 mg/dL — ABNORMAL HIGH (ref 70–99)

## 2024-05-27 LAB — BASIC METABOLIC PANEL WITH GFR
Anion gap: 8 (ref 5–15)
BUN: 29 mg/dL — ABNORMAL HIGH (ref 8–23)
CO2: 23 mmol/L (ref 22–32)
Calcium: 9 mg/dL (ref 8.9–10.3)
Chloride: 104 mmol/L (ref 98–111)
Creatinine, Ser: 1.63 mg/dL — ABNORMAL HIGH (ref 0.44–1.00)
GFR, Estimated: 32 mL/min — ABNORMAL LOW (ref >60)
Glucose, Bld: 88 mg/dL (ref 70–99)
Potassium: 4.2 mmol/L (ref 3.5–5.1)
Sodium: 135 mmol/L (ref 135–145)

## 2024-05-27 LAB — CBC
HCT: 31.4 % — ABNORMAL LOW (ref 36.0–46.0)
Hemoglobin: 10 g/dL — ABNORMAL LOW (ref 12.0–15.0)
MCH: 27.7 pg (ref 26.0–34.0)
MCHC: 31.8 g/dL (ref 30.0–36.0)
MCV: 87 fL (ref 80.0–100.0)
Platelets: 241 K/uL (ref 150–400)
RBC: 3.61 MIL/uL — ABNORMAL LOW (ref 3.87–5.11)
RDW: 15.4 % (ref 11.5–15.5)
WBC: 9.8 K/uL (ref 4.0–10.5)
nRBC: 0 % (ref 0.0–0.2)

## 2024-05-27 LAB — BRAIN NATRIURETIC PEPTIDE: B Natriuretic Peptide: 206.5 pg/mL — ABNORMAL HIGH (ref 0.0–100.0)

## 2024-05-27 LAB — PROCALCITONIN: Procalcitonin: 5.06 ng/mL

## 2024-05-27 MED ORDER — FUROSEMIDE 40 MG PO TABS
40.0000 mg | ORAL_TABLET | Freq: Once | ORAL | Status: AC
  Administered 2024-05-27: 40 mg via ORAL
  Filled 2024-05-27: qty 1

## 2024-05-27 MED ORDER — FUROSEMIDE 10 MG/ML IJ SOLN: 40.0000 mg | Freq: Once | INTRAMUSCULAR | Status: DC

## 2024-05-27 NOTE — Progress Notes (Signed)
 SATURATION QUALIFICATIONS: (This note is used to comply with regulatory documentation for home oxygen)  Patient Saturations on Room Air at Rest = 84%  Patient Saturations on Room Air while Ambulating = 78%  Patient Saturations on 3 Liters of oxygen while Ambulating = 94%  Please briefly explain why patient needs home oxygen: Pt is unable to maintain O2 sats >87% while mobilizing without supplemental O2.   See full PT treatment session note for further details.      Leita Sable, PT, DPT Acute Rehabilitation Services Secure Chat Preferred Office: [phone]

## 2024-05-27 NOTE — Progress Notes (Signed)
    Durable Medical Equipment  (From admission, onward)           Start     Ordered   05/27/24 1040  For home use only DME oxygen  Once       Comments: Dx: Pneumonia  Question Answer Comment  Length of Need Lifetime   Mode or (Route) Nasal cannula   Liters per Minute 3   Oxygen delivery system Gas      05/27/24 1039

## 2024-05-27 NOTE — Progress Notes (Signed)
 Physical Therapy Treatment Patient Details Name: [name] MRN: [id number] DOB: 03-25-1946 Today's Date: 05/27/2024   History of Present Illness [name] is a 78 yo female who presents 05/22/2024 with worsening shortness of breath, coughing and blood-tinged sputum.  Admitted for PNA. 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    PT Comments  Pt progressing towards physical therapy goals. Was able to perform transfers and ambulation with gross supervision for safety to CGA and RW for support. Pt on RA upon arrival attempting to go to the bathroom without assist. Noted SpO2 down to 78% after being on RA approximately 10 minutes. While ambulating on 3L/min supplemental O2 pt able to maintain sats 94%. Will continue to follow and progress as able per POC.    If plan is discharge home, recommend the following: A little help with walking and/or transfers;A little help with bathing/dressing/bathroom;Assistance with cooking/housework;Assist for transportation;Help with stairs or ramp for entrance   Can travel by private vehicle        Equipment Recommendations  None recommended by PT    Recommendations for Other Services       Precautions / Restrictions Precautions Precautions: Fall Recall of Precautions/Restrictions: Intact Precaution/Restrictions Comments: Watch O2 Restrictions Weight Bearing Restrictions Per Provider Order: No     Mobility  Bed Mobility               General bed mobility comments: Pt was received sitting up EOB when PT arrived. Pt was on RA and states she was just about to get up and walk into the bathroom.    Transfers Overall transfer level: Needs assistance Equipment used: Rolling walker (2 wheels) Transfers: Sit to/from Stand Sit to Stand: Supervision            General transfer comment: Light supervision for safety. VC's for hand placement on seated surface for safety, however pt without unsteadiness when she did stand with hands on the walker.    Ambulation/Gait Ambulation/Gait assistance: Contact guard assist Gait Distance (Feet): 150 Feet Assistive device: Rolling walker (2 wheels) Gait Pattern/deviations: Step-through pattern, Decreased stride length, Trunk flexed Gait velocity: Decreased Gait velocity interpretation: <1.8 ft/sec, indicate of risk for recurrent falls   General Gait Details: VC's for improved posture, closer walker proximity and forward gaze. Pt reports feeling new pain in her L foot during ambulation. States it started in the last couple of days. Pt does not appear to be limping or favoring the L foot despite complaints.   Stairs             Wheelchair Mobility     Tilt Bed    Modified Rankin (Stroke Patients Only)       Balance Overall balance assessment: Needs assistance Sitting-balance support: Feet supported Sitting balance-Leahy Scale: Good     Standing balance support: Bilateral upper extremity supported, During functional activity Standing balance-Leahy Scale: Poor                              Communication Communication Communication: No apparent difficulties  Cognition Arousal: Alert Behavior During Therapy: WFL for tasks assessed/performed   PT - Cognitive impairments: No apparent impairments  Following commands: Intact      Cueing Cueing Techniques: Verbal cues  Exercises      General Comments General comments (skin integrity, edema, etc.): Pt on RA at start of session. Checked SpO2 upon return to chair from bathroom and sats 78% (on RA approximately 10 minutes). Pt placed on 3L/min supplemental O2 and quickly rebounded to 97%.      Pertinent Vitals/Pain Pain Assessment Pain Assessment: Faces Faces Pain Scale: Hurts a  little bit Pain Location: L foot Pain Descriptors / Indicators: Discomfort, Grimacing, Guarding Pain Intervention(s): Limited activity within patient's tolerance, Monitored during session, Repositioned    Home Living                          Prior Function            PT Goals (current goals can now be found in the care plan section) Acute Rehab PT Goals Patient Stated Goal: not be on home O2; at d/c be able to go out when she wants to. PT Goal Formulation: With patient Time For Goal Achievement: 06/01/24 Potential to Achieve Goals: Good Progress towards PT goals: Progressing toward goals    Frequency    Min 2X/week      PT Plan      Co-evaluation              AM-PAC PT 6 Clicks Mobility   Outcome Measure  Help needed turning from your back to your side while in a flat bed without using bedrails?: A Little Help needed moving from lying on your back to sitting on the side of a flat bed without using bedrails?: A Little Help needed moving to and from a bed to a chair (including a wheelchair)?: A Little Help needed standing up from a chair using your arms (e.g., wheelchair or bedside chair)?: A Little Help needed to walk in hospital room?: A Little Help needed climbing 3-5 steps with a railing? : A Little 6 Click Score: 18    End of Session Equipment Utilized During Treatment: Gait belt;Oxygen Activity Tolerance: Patient tolerated treatment well Patient left: in chair;with call bell/phone within reach;with chair alarm set Nurse Communication: Mobility status PT Visit Diagnosis: Unsteadiness on feet (R26.81);Muscle weakness (generalized) (M62.81);Difficulty in walking, not elsewhere classified (R26.2)     Time: 0840-0900 PT Time Calculation (min) (ACUTE ONLY): 20 min  Charges:    $Gait Training: 8-22 mins PT General Charges $$ ACUTE PT VISIT: 1 Visit                     Leita Sable, PT, DPT Acute Rehabilitation Services Secure Chat  Preferred Office: [phone]    [name] 05/27/2024, 9:07 AM

## 2024-05-27 NOTE — Plan of Care (Signed)

## 2024-05-27 NOTE — Progress Notes (Deleted)
 PROGRESS NOTE    RAYLIN DIGUGLIELMO  FMW:[id number] DOB: [birth year] DOA: 05/22/2024 PCP: Yolande Toribio MATSU, MD    Brief Narrative:  78 year old with history of HTN, CHF, COPD/asthma, chronic pancreatitis, DM 2, depression, diabetes with neuropathy, PVD recently diagnosed with pneumonia a week ago and completed cefdinir.  Continued to have shortness of breath therefore admitted to the hospital.  Chest x-ray showed concerns of multifocal pneumonia therefore started on broad-spectrum antibiotics.  Also required diuretics during the hospitalization due to some signs of volume overload.  VQ scan was negative for PE and lower extremity Dopplers were negative for DVT.  Hospital course was complicated by AKI but improved with hydration.   Assessment & Plan:  Principal Problem:   Multifocal pneumonia Active Problems:   CKD (chronic kidney disease) stage 4, GFR 15-29 ml/min (HCC)   Chronic combined systolic and diastolic CHF (congestive heart failure) (HCC)   Dyslipidemia   Chronic depression   Iron  deficiency anemia, unspecified    Asthma   Chronic pancreatitis (HCC)   Type 2 diabetes mellitus with diabetic peripheral angiopathy without gangrene (HCC)   PVD (peripheral vascular disease) (HCC)   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HCC)     Sepsis POA Multifocal pneumonia Acute hypoxic respiratory failure:  Recently completed antibiotics and failed outpatient treatment (  cefdinir) admitted with sepsis/multifocal pneumonia -remains hemodynamically stable leukocytosis resolved. Blood culture NGTD.  Urine antigen strep negative. MRSA PCR negative.  Leukocytosis have resolved. -Currently on cefepime  and doxycycline . -Check Pro-Cal and BNP -Ambulatory pulse ox showing hypoxia down to 78% on  RA, needing 3L [place]   D-dimer positive: VQ scan negative, bilateral lower extremity Dopplers negative for DVT   AKI on CKD stage 4 Metabolic acidosis: Creatinine peaked around 2.4, now stabilizing around  baseline of 1.7  COPD Asthma: No wheezing, continue inhalers, Singulair .   Peripheral vascular disease Carotid artery disease Cont Plavix  and statin   Hypertension Chronic diastolic CHF, EF 44% Nonsustained V. tach Leg swelling worse from baseline Hypomagnesemia: Echo updated 8/20 EF normal 55-60, G1 DD RV size normal, MV-degenerative, AV not well-visualized.  Continue home Toprol , Lasix    Chronic knee pain and ambulatory dysfunction On Chronic norco: Continue hydrocodone  at lower dose. She chronically takes it As per daughter in law/son,  she is mostly bedbound by her choice but she is ambulatory  Cont PT OT    Chronic diarrhea Chronic pancreatitis: Cont lomotil  prn   Dyslipidemia cont Lipitor.   Chronic depression: Continue home doxepin , Remeron , Effexor    Chronic Rash/eczema: Appears generalized on extremities and back with excoriation, going on for 3 months. 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 off isolation.  Previous provider discussed with infectious disease   Seizure disorder: continue home Keppra    IDA/anemia of CKD: Hemoglobin around baseline 10.0  T2DM with diabetic peripheral angiopathy without gangrene: A1c 6.1.  Sliding scale and Accu-Cheks   Class I Obesity w/ Body mass index is 30.87 kg/m.: OSA: Will benefit with PCP follow-up, weight loss,healthy lifestyle.  Patient is noncompliant with CPAP as per home Patient is sedentary and not much mobile. She is high risk for readmission. She will benefit palliative care follow-up as outpatient  DVT prophylaxis: enoxaparin  (LOVENOX ) injection 30 mg Start: 05/22/24 2000 SCDs Start: 05/22/24 1814 Code Status:   Code Status: Full Code Family Communication:Called Lynwood, son. Level of care: Telemetry Medical    PT  Follow up Recs: Home Health Pt8/09/2024 1324.  Face to face  completed  Subjective:  Doing ok,  Examination:  General exam: Appears calm and comfortable  Respiratory system: Clear to auscultation. Respiratory effort normal. Cardiovascular system: S1 & S2 heard, RRR. No JVD, murmurs, rubs, gallops or clicks. No pedal edema. Gastrointestinal system: Abdomen is nondistended, soft and nontender. No organomegaly or masses felt. Normal bowel sounds heard. Central nervous system: Alert and oriented. No focal neurological deficits. Extremities: Symmetric 5 x 5 power. Skin: No rashes, lesions or ulcers Psychiatry: Judgement and insight appear normal. Mood & affect appropriate.                Diet Orders (From admission, onward)     Start     Ordered   05/22/24 1814  Diet Carb Modified Fluid consistency: Thin; Room service appropriate? Yes; Fluid restriction: 1800 mL Fluid  Diet effective now       Question Answer Comment  Diet-HS Snack? Nothing   Calorie Level Medium 1600-2000   Fluid consistency: Thin   Room service appropriate? Yes   Fluid restriction: 1800 mL Fluid      05/22/24 1813            Objective: Vitals:   05/26/24 1933 05/26/24 1959 05/27/24 0715 05/27/24 0837  BP: (!) 165/57  (!) 192/75   Pulse: 82  79   Resp: 18  17   Temp: 98 F (36.7 C)  98 F (36.7 C)   TempSrc: Oral  Oral   SpO2: 99% 98% 99% 99%  Weight:      Height:       No intake or output data in the 24 hours ending 05/27/24 1250 Filed Weights   05/22/24 2046 05/23/24 0432  Weight: 70.4 kg 74.1 kg    Scheduled Meds:  amLODipine   10 mg Oral Daily   atorvastatin   40 mg Oral Daily   budesonide -glycopyrrolate -formoterol   2 puff Inhalation BID   clopidogrel   75 mg Oral Daily   cyanocobalamin   500 mcg Oral Daily   doxepin   10 mg Oral QHS   doxycycline   100 mg Oral Q12H   enoxaparin  (LOVENOX ) injection  30 mg Subcutaneous Q24H   furosemide   40 mg Intravenous Once   furosemide   20 mg Oral Daily   guaiFENesin   600 mg Oral BID   insulin  aspart   0-5 Units Subcutaneous QHS   insulin  aspart  0-9 Units Subcutaneous TID WC   iron  polysaccharides  150 mg Oral Daily   levETIRAcetam   500 mg Oral BID   metoprolol  succinate  100 mg Oral Daily   mirtazapine   15 mg Oral QHS   montelukast   10 mg Oral Daily   mupirocin  ointment  1 Application Topical BID   pantoprazole   40 mg Oral BID   venlafaxine  XR  75 mg Oral Daily   Continuous Infusions:  ceFEPime  (MAXIPIME ) IV 2 g (05/26/24 1752)    Nutritional status     Body mass index is 30.87 kg/m.  Data Reviewed:   CBC: Recent Labs  Lab 05/22/24 1604 05/22/24 1611 05/23/24 0813 05/24/24 0700 05/25/24 0544 05/26/24 0431 05/27/24 0526  WBC 36.6*  --  32.8* 20.5* 14.1* 10.9* 9.8  NEUTROABS 30.7*  --   --   --   --   --   --   HGB 10.3*   < > 9.5* 9.9* 9.5* 9.7* 10.0*  HCT 32.2*   < > 30.7* 31.1* 29.4* 29.7* 31.4*  MCV 88.5  --  91.4 87.9 86.2 86.6 87.0  PLT 236  --  197 213 219 225 241   < > = values in this interval not displayed.   Basic Metabolic Panel: Recent Labs  Lab 05/23/24 1854 05/24/24 0659 05/24/24 0700 05/25/24 0544 05/26/24 0431 05/27/24 0526  NA 137  --  138 134* 136 135  K 3.8  --  3.5 3.9 3.9 4.2  CL 104  --  105 102 103 104  CO2 22  --  23 22 23 23   GLUCOSE 112*  --  87 86 89 88  BUN 35*  --  29* 25* 27* 29*  CREATININE 2.27*  --  1.84* 1.57* 1.70* 1.63*  CALCIUM  8.1*  --  8.4* 8.6* 9.0 9.0  MG  --  1.1*  --   --   --   --    GFR: Estimated Creatinine Clearance: 27 mL/min (A) (by C-G formula based on SCr of 1.63 mg/dL (H)). Liver Function Tests: Recent Labs  Lab 05/22/24 1604  AST 36  ALT 15  ALKPHOS 105  BILITOT 1.0  PROT 5.9*  ALBUMIN  2.5*   No results for input(s): LIPASE, AMYLASE in the last 168 hours. No results for input(s): AMMONIA in the last 168 hours. Coagulation Profile: No results for input(s): INR, PROTIME in the last 168 hours. Cardiac Enzymes: No results for input(s): CKTOTAL, CKMB, CKMBINDEX,  TROPONINI in the last 168 hours. BNP (last 3 results) No results for input(s): PROBNP in the last 8760 hours. HbA1C: No results for input(s): HGBA1C in the last 72 hours. CBG: Recent Labs  Lab 05/26/24 1138 05/26/24 1639 05/26/24 2136 05/27/24 0823 05/27/24 1101  GLUCAP 105* 97 147* 88 115*   Lipid Profile: No results for input(s): CHOL, HDL, LDLCALC, TRIG, CHOLHDL, LDLDIRECT in the last 72 hours. Thyroid  Function Tests: No results for input(s): TSH, T4TOTAL, FREET4, T3FREE, THYROIDAB in the last 72 hours. Anemia Panel: No results for input(s): VITAMINB12, FOLATE, FERRITIN, TIBC, IRON , RETICCTPCT in the last 72 hours. Sepsis Labs: Recent Labs  Lab 05/22/24 1611 05/27/24 0930  PROCALCITON  --  5.06  LATICACIDVEN 1.4  --     Recent Results (from the past 240 hours)  Blood culture (routine x 2)     Status: None   Collection Time: 05/22/24  4:01 PM   Specimen: BLOOD  Result Value Ref Range Status   Specimen Description BLOOD RIGHT ANTECUBITAL  Final   Special Requests   Final    BOTTLES DRAWN AEROBIC AND ANAEROBIC Blood Culture adequate volume   Culture   Final    NO GROWTH 5 DAYS Performed at [facility], [address], [place], [place] [address]    Report Status 05/27/2024 FINAL  Final  Blood culture (routine x 2)     Status: None   Collection Time: 05/22/24  4:04 PM   Specimen: BLOOD LEFT ARM  Result Value Ref Range Status   Specimen Description BLOOD LEFT ARM  Final   Special Requests   Final    BOTTLES DRAWN AEROBIC AND ANAEROBIC Blood Culture adequate volume   Culture   Final    NO GROWTH 5 DAYS Performed at [facility], [address] [place], [place] [address]    Report Status 05/27/2024 FINAL  Final  Resp panel by RT-PCR (RSV, Flu A&B, Covid) Anterior Nasal Swab     Status: None   Collection Time: 05/22/24  4:04 PM   Specimen: Anterior Nasal Swab  Result Value Ref  Range Status   SARS Coronavirus 2  by RT PCR NEGATIVE NEGATIVE Final   Influenza A by PCR NEGATIVE NEGATIVE Final   Influenza B by PCR NEGATIVE NEGATIVE Final    Comment: (NOTE) The Xpert Xpress SARS-CoV-2/FLU/RSV plus assay is intended as an aid in the diagnosis of influenza from Nasopharyngeal swab specimens and should not be used as a sole basis for treatment. Nasal washings and aspirates are unacceptable for Xpert Xpress SARS-CoV-2/FLU/RSV testing.  Fact Sheet for Patients: BloggerCourse.com  Fact Sheet for Healthcare Providers: SeriousBroker.it  This test is not yet approved or cleared by the [place]  FDA and has been authorized for detection and/or diagnosis of SARS-CoV-2 by FDA under an Emergency Use Authorization (EUA). This EUA will remain in effect (meaning this test can be used) for the duration of the COVID-19 declaration under Section 564(b)(1) of the Act, 21 U.S.C. section 360bbb-3(b)(1), unless the authorization is terminated or revoked.     Resp Syncytial Virus by PCR NEGATIVE NEGATIVE Final    Comment: (NOTE) Fact Sheet for Patients: BloggerCourse.com  Fact Sheet for Healthcare Providers: SeriousBroker.it  This test is not yet approved or cleared by the [place]  FDA and has been authorized for detection and/or diagnosis of SARS-CoV-2 by FDA under an Emergency Use Authorization (EUA). This EUA will remain in effect (meaning this test can be used) for the duration of the COVID-19 declaration under Section 564(b)(1) of the Act, 21 U.S.C. section 360bbb-3(b)(1), unless the authorization is terminated or revoked.  Performed at [facility], [address], [place], [place] [address]   MRSA Next Gen by PCR, Nasal     Status: None   Collection Time: 05/22/24  6:14 PM   Specimen: Nasal Mucosa; Nasal Swab  Result Value Ref Range Status   MRSA by PCR Next Gen NOT DETECTED NOT DETECTED  Final    Comment: (NOTE) The GeneXpert MRSA Assay (FDA approved for NASAL specimens only), is one component of a comprehensive MRSA colonization surveillance program. It is not intended to diagnose MRSA infection nor to guide or monitor treatment for MRSA infections. Test performance is not FDA approved in patients less than 61 years old. Performed at [facility], [address], [place], [place] [address]          Radiology Studies: No results found.         LOS: 5 days   Time spent= 35 mins    Burgess JAYSON Dare, MD [facility]  If 7PM-7AM, please contact night-coverage  05/27/2024, 12:50 PM

## 2024-05-27 NOTE — Progress Notes (Signed)
 Occupational Therapy Treatment Patient Details Name: [name] MRN: [id number] DOB: 07-23-1946 Today's Date: 05/27/2024   History of present illness [name] is a 78 yo female who presents 05/22/2024 with worsening shortness of breath, coughing and blood-tinged sputum.  Admitted for PNA. 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   OT comments  Pt soiled upon OT arrival, NT in room assisting and providing setup. Pt performed cleaning and changing of new gown with grossly supervision/setup A. She c/o L foot pain and it is still noticeable swollen when compared to her R, limited tolerance with OOB mobility as she reports feeling weaker in the legs with prolonged mobility. Pt ambulated total of 62ft with close supervision + RW, VSS on 3L. Demonstrated to pt how to use her home 02 tank at end of session, also educated pt on energy conservation strategies and reiterated pacing herself during her upcoming trip to the beach. OT to continue to progress pt as able, DC plans remain appropriate for [facility].       If plan is discharge home, recommend the following:  A little help with walking and/or transfers;A little help with bathing/dressing/bathroom;Assistance with cooking/housework;Assist for transportation;Help with stairs or ramp for entrance   Equipment Recommendations  None recommended by OT    Recommendations for Other Services      Precautions / Restrictions Precautions Precautions: Fall Recall of Precautions/Restrictions: Intact Precaution/Restrictions Comments: Watch O2 Restrictions Weight Bearing Restrictions Per Provider Order: No       Mobility Bed Mobility Overal bed mobility: Needs Assistance Bed Mobility: Supine to Sit, Sit to Supine     Supine to sit: Supervision Sit to supine:  Supervision        Transfers Overall transfer level: Needs assistance Equipment used: None Transfers: Sit to/from Stand Sit to Stand: Supervision                 Balance Overall balance assessment: Needs assistance Sitting-balance support: Feet supported Sitting balance-Leahy Scale: Good     Standing balance support: Bilateral upper extremity supported, During functional activity Standing balance-Leahy Scale: Poor Standing balance comment: RW for dynamic gait exceeding 39ft.                           ADL either performed or assessed with clinical judgement   ADL               Lower Body Bathing: Sit to/from stand;Supervison/ safety (pt wiping above knees and perineal area)   Upper Body Dressing : Set up;Sitting (don gown and gown in jacket like fashion)   Lower Body Dressing: Sit to/from stand;Contact guard assist (don new briefs)   Toilet Transfer: Contact guard assist;BSC/3in1;Stand-pivot   Toileting- Clothing Manipulation and Hygiene: Supervision/safety;Sitting/lateral lean       Functional mobility during ADLs: Supervision/safety;Rolling walker (2 wheels) General ADL Comments: Educated pt on energy conservation strategies    Extremity/Trunk Insurance account manager Communication Communication: No apparent difficulties   Cognition Arousal: Alert Behavior During Therapy: WFL for tasks assessed/performed Cognition: No apparent impairments  Following commands: Intact        Cueing   Cueing Techniques: Verbal cues  Exercises      Shoulder Instructions       General Comments VSS on 3L    Pertinent Vitals/ Pain       Pain Assessment Pain Assessment: Faces Faces Pain Scale: Hurts a little bit Pain Location: L foot Pain Descriptors / Indicators: Discomfort, Grimacing, Guarding Pain Intervention(s): Limited activity within  patient's tolerance, Monitored during session, Repositioned  Home Living                                          Prior Functioning/Environment              Frequency  Min 2X/week        Progress Toward Goals  OT Goals(current goals can now be found in the care plan section)  Progress towards OT goals: Progressing toward goals  Acute Rehab OT Goals Patient Stated Goal: to feel better OT Goal Formulation: With patient Time For Goal Achievement: 06/07/24 Potential to Achieve Goals: Good  Plan      Co-evaluation                 AM-PAC OT 6 Clicks Daily Activity     Outcome Measure   Help from another person eating meals?: None Help from another person taking care of personal grooming?: A Little Help from another person toileting, which includes using toliet, bedpan, or urinal?: A Little Help from another person bathing (including washing, rinsing, drying)?: A Little Help from another person to put on and taking off regular upper body clothing?: A Little Help from another person to put on and taking off regular lower body clothing?: A Little 6 Click Score: 19    End of Session Equipment Utilized During Treatment: Rolling walker (2 wheels);Gait belt  OT Visit Diagnosis: Unsteadiness on feet (R26.81);Other abnormalities of gait and mobility (R26.89);Muscle weakness (generalized) (M62.81)   Activity Tolerance Patient tolerated treatment well   Patient Left in bed;with call bell/phone within reach;with chair alarm set   Nurse Communication Mobility status        Time: 8294-8271 OT Time Calculation (min): 23 min  Charges: OT General Charges $OT Visit: 1 Visit OT Treatments $Self Care/Home Management : 8-22 mins $Therapeutic Activity: 8-22 mins  05/27/2024  AB, OTR/L  Acute Rehabilitation Services  Office: [phone]   Curtistine JONETTA Das 05/27/2024, 5:46 PM

## 2024-05-27 NOTE — TOC Transition Note (Signed)
 Transition of Care [facility]) - Discharge Note   Patient Details  Name: [name] MRN: [id number] Date of Birth: [birth year]  [facility] CM/SW Contact:  Rosalva Jon Bloch, RN Phone Number: 05/27/2024, 11:04 AM   Clinical Narrative:    Patient will DC to: home Anticipated DC date: 05/27/2024 Family notified: yes Transport by: car          SOB/PNA Per MD patient ready for DC today. RN, patient, patient's family, and [facility] notified of discharge. Home DME order noted, pt agreeable and without provider preference. Referral made with [facility] for home oxygen. Equipment will be delivered to bedside prior to d/c. Pt without RX MED concerns or transportation issues. Post hospital f/u noted on AVS. RNCM will sign off for now as intervention is no longer needed. Please consult us  again if new needs arise.    Final next level of care: Home w Home Health Services Barriers to Discharge: No Barriers Identified   Patient Goals and CMS Choice     Choice offered to / list presented to : Patient      Discharge Placement                       Discharge Plan and Services Additional resources added to the After Visit Summary for     Discharge Planning Services: CM Consult            DME Arranged: Oxygen DME Agency: [facility] Date DME Agency Contacted: 05/27/24 Time DME Agency Contacted: 1104 Representative spoke with at DME Agency: Darlyn HH Arranged: PT, OT HH Agency: [facility] Date [facility] Agency Contacted: 05/26/24 Time HH Agency Contacted: [phone] Representative spoke with at [facility] Agency: Arna  Social Drivers of Health (SDOH) Interventions SDOH Screenings   Food Insecurity: No Food Insecurity (05/23/2024)  Housing: Patient Declined (05/23/2024)  Transportation Needs: No Transportation Needs (05/23/2024)  Utilities: Not At Risk (05/23/2024)  Social Connections: Unknown (05/23/2024)  Tobacco Use: Medium Risk (05/22/2024)     Readmission Risk  Interventions    07/01/2023   12:56 PM 06/27/2023    8:42 AM 06/04/2023    3:49 PM  Readmission Risk Prevention Plan  Transportation Screening Complete Complete Complete  Medication Review (RN Care Manager)   Referral to Pharmacy  PCP or Specialist appointment within 3-5 days of discharge   Complete  HRI or Home Care Consult   Complete  SW Recovery Care/Counseling Consult   Complete  Palliative Care Screening   Not Applicable  Skilled Nursing Facility   Not Applicable

## 2024-05-27 NOTE — Progress Notes (Signed)
 PROGRESS NOTE    [name]  FMW:[id number] DOB: 1946/07/06 DOA: 05/22/2024 PCP: Yolande Toribio MATSU, MD    Brief Narrative:  78 year old with history of HTN, CHF, COPD/asthma, chronic pancreatitis, DM 2, depression, diabetes with neuropathy, PVD recently diagnosed with pneumonia a week ago and completed cefdinir.  Continued to have shortness of breath therefore admitted to the hospital.  Chest x-ray showed concerns of multifocal pneumonia therefore started on broad-spectrum antibiotics.  Also required diuretics during the hospitalization due to some signs of volume overload.  VQ scan was negative for PE and lower extremity Dopplers were negative for DVT.  Hospital course was complicated by AKI but improved with hydration.   Assessment & Plan:  Principal Problem:   Multifocal pneumonia Active Problems:   CKD (chronic kidney disease) stage 4, GFR 15-29 ml/min (HCC)   Chronic combined systolic and diastolic CHF (congestive heart failure) (HCC)   Dyslipidemia   Chronic depression   Iron  deficiency anemia, unspecified    Asthma   Chronic pancreatitis (HCC)   Type 2 diabetes mellitus with diabetic peripheral angiopathy without gangrene (HCC)   PVD (peripheral vascular disease) (HCC)   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HCC)     Sepsis POA Multifocal pneumonia Acute hypoxic respiratory failure:  Recently completed antibiotics and failed outpatient treatment (  cefdinir) admitted with sepsis/multifocal pneumonia -remains hemodynamically stable leukocytosis resolved. Blood culture NGTD.  Urine antigen strep negative. MRSA PCR negative.  Leukocytosis have resolved. -Currently on cefepime  and doxycycline . -Check Pro-Cal and BNP -Ambulatory pulse ox showing hypoxia down to 78% on  RA, needing 3L [place]   D-dimer positive: VQ scan negative, bilateral lower extremity Dopplers negative for DVT   AKI on CKD stage 4 Metabolic acidosis: Creatinine peaked around 2.4, now stabilizing around  baseline of 1.7  COPD Asthma: No wheezing, continue inhalers, Singulair .   Peripheral vascular disease Carotid artery disease Cont Plavix  and statin   Hypertension Chronic diastolic CHF, EF 44% Nonsustained V. tach Leg swelling worse from baseline Hypomagnesemia: Echo updated 8/20 EF normal 55-60, G1 DD RV size normal, MV-degenerative, AV not well-visualized.  Continue home Toprol , Lasix    Chronic knee pain and ambulatory dysfunction On Chronic norco: Continue hydrocodone  at lower dose. She chronically takes it As per daughter in law/son,  she is mostly bedbound by her choice but she is ambulatory  Cont PT OT    Chronic diarrhea Chronic pancreatitis: Cont lomotil  prn   Dyslipidemia cont Lipitor.   Chronic depression: Continue home doxepin , Remeron , Effexor    Chronic Rash/eczema: Appears generalized on extremities and back with excoriation, going on for 3 months. 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 off isolation.  Previous provider discussed with infectious disease   Seizure disorder: continue home Keppra    IDA/anemia of CKD: Hemoglobin around baseline 10.0  T2DM with diabetic peripheral angiopathy without gangrene: A1c 6.1.  Sliding scale and Accu-Cheks   Class I Obesity w/ Body mass index is 30.87 kg/m.: OSA: Will benefit with PCP follow-up, weight loss,healthy lifestyle.  Patient is noncompliant with CPAP as per home Patient is sedentary and not much mobile. She is high risk for readmission. She will benefit palliative care follow-up as outpatient  DVT prophylaxis: enoxaparin  (LOVENOX ) injection 30 mg Start: 05/22/24 2000 SCDs Start: 05/22/24 1814 Code Status:   Code Status: Full Code Family Communication:Called [place], son. Level of care: Telemetry Medical    PT  Follow up Recs: Home Health Pt8/09/2024 1324.  Face to face  completed  Subjective:  Doing ok, sitting up in the recliner.   Examination:  General exam: Appears calm and comfortable  Respiratory system: Clear to auscultation. Respiratory effort normal. Cardiovascular system: S1 & S2 heard, RRR. No JVD, murmurs, rubs, gallops or clicks. No pedal edema. Gastrointestinal system: Abdomen is nondistended, soft and nontender. No organomegaly or masses felt. Normal bowel sounds heard. Central nervous system: Alert and oriented. No focal neurological deficits. Extremities: Symmetric 5 x 5 power. Skin: No rashes, lesions or ulcers Psychiatry: Judgement and insight appear normal. Mood & affect appropriate.                Diet Orders (From admission, onward)     Start     Ordered   05/22/24 1814  Diet Carb Modified Fluid consistency: Thin; Room service appropriate? Yes; Fluid restriction: 1800 mL Fluid  Diet effective now       Question Answer Comment  Diet-HS Snack? Nothing   Calorie Level Medium 1600-2000   Fluid consistency: Thin   Room service appropriate? Yes   Fluid restriction: 1800 mL Fluid      05/22/24 1813            Objective: Vitals:   05/26/24 1933 05/26/24 1959 05/27/24 0715 05/27/24 0837  BP: (!) 165/57  (!) 192/75   Pulse: 82  79   Resp: 18  17   Temp: 98 F (36.7 C)  98 F (36.7 C)   TempSrc: Oral  Oral   SpO2: 99% 98% 99% 99%  Weight:      Height:       No intake or output data in the 24 hours ending 05/27/24 1244 Filed Weights   05/22/24 2046 05/23/24 0432  Weight: 70.4 kg 74.1 kg    Scheduled Meds:  amLODipine   10 mg Oral Daily   atorvastatin   40 mg Oral Daily   budesonide -glycopyrrolate -formoterol   2 puff Inhalation BID   clopidogrel   75 mg Oral Daily   cyanocobalamin   500 mcg Oral Daily   doxepin   10 mg Oral QHS   doxycycline   100 mg Oral Q12H   enoxaparin  (LOVENOX ) injection  30 mg Subcutaneous Q24H   furosemide   20 mg Oral Daily   guaiFENesin   600 mg Oral BID   insulin  aspart  0-5  Units Subcutaneous QHS   insulin  aspart  0-9 Units Subcutaneous TID WC   iron  polysaccharides  150 mg Oral Daily   levETIRAcetam   500 mg Oral BID   metoprolol  succinate  100 mg Oral Daily   mirtazapine   15 mg Oral QHS   montelukast   10 mg Oral Daily   mupirocin  ointment  1 Application Topical BID   pantoprazole   40 mg Oral BID   venlafaxine  XR  75 mg Oral Daily   Continuous Infusions:  ceFEPime  (MAXIPIME ) IV 2 g (05/26/24 1752)    Nutritional status     Body mass index is 30.87 kg/m.  Data Reviewed:   CBC: Recent Labs  Lab 05/22/24 1604 05/22/24 1611 05/23/24 0813 05/24/24 0700 05/25/24 0544 05/26/24 0431 05/27/24 0526  WBC 36.6*  --  32.8* 20.5* 14.1* 10.9* 9.8  NEUTROABS 30.7*  --   --   --   --   --   --   HGB 10.3*   < > 9.5* 9.9* 9.5* 9.7* 10.0*  HCT 32.2*   < > 30.7* 31.1* 29.4* 29.7* 31.4*  MCV 88.5  --  91.4 87.9 86.2 86.6 87.0  PLT 236  --  197 213 219 225 241   < > = values in this interval not displayed.   Basic Metabolic Panel: Recent Labs  Lab 05/23/24 1854 05/24/24 0659 05/24/24 0700 05/25/24 0544 05/26/24 0431 05/27/24 0526  NA 137  --  138 134* 136 135  K 3.8  --  3.5 3.9 3.9 4.2  CL 104  --  105 102 103 104  CO2 22  --  23 22 23 23   GLUCOSE 112*  --  87 86 89 88  BUN 35*  --  29* 25* 27* 29*  CREATININE 2.27*  --  1.84* 1.57* 1.70* 1.63*  CALCIUM  8.1*  --  8.4* 8.6* 9.0 9.0  MG  --  1.1*  --   --   --   --    GFR: Estimated Creatinine Clearance: 27 mL/min (A) (by C-G formula based on SCr of 1.63 mg/dL (H)). Liver Function Tests: Recent Labs  Lab 05/22/24 1604  AST 36  ALT 15  ALKPHOS 105  BILITOT 1.0  PROT 5.9*  ALBUMIN  2.5*   No results for input(s): LIPASE, AMYLASE in the last 168 hours. No results for input(s): AMMONIA in the last 168 hours. Coagulation Profile: No results for input(s): INR, PROTIME in the last 168 hours. Cardiac Enzymes: No results for input(s): CKTOTAL, CKMB, CKMBINDEX, TROPONINI in  the last 168 hours. BNP (last 3 results) No results for input(s): PROBNP in the last 8760 hours. HbA1C: No results for input(s): HGBA1C in the last 72 hours. CBG: Recent Labs  Lab 05/26/24 1138 05/26/24 1639 05/26/24 2136 05/27/24 0823 05/27/24 1101  GLUCAP 105* 97 147* 88 115*   Lipid Profile: No results for input(s): CHOL, HDL, LDLCALC, TRIG, CHOLHDL, LDLDIRECT in the last 72 hours. Thyroid  Function Tests: No results for input(s): TSH, T4TOTAL, FREET4, T3FREE, THYROIDAB in the last 72 hours. Anemia Panel: No results for input(s): VITAMINB12, FOLATE, FERRITIN, TIBC, IRON , RETICCTPCT in the last 72 hours. Sepsis Labs: Recent Labs  Lab 05/22/24 1611 05/27/24 0930  PROCALCITON  --  5.06  LATICACIDVEN 1.4  --     Recent Results (from the past 240 hours)  Blood culture (routine x 2)     Status: None   Collection Time: 05/22/24  4:01 PM   Specimen: BLOOD  Result Value Ref Range Status   Specimen Description BLOOD RIGHT ANTECUBITAL  Final   Special Requests   Final    BOTTLES DRAWN AEROBIC AND ANAEROBIC Blood Culture adequate volume   Culture   Final    NO GROWTH 5 DAYS Performed at [facility], [address] [place], [place] [address]    Report Status 05/27/2024 FINAL  Final  Blood culture (routine x 2)     Status: None   Collection Time: 05/22/24  4:04 PM   Specimen: BLOOD LEFT ARM  Result Value Ref Range Status   Specimen Description BLOOD LEFT ARM  Final   Special Requests   Final    BOTTLES DRAWN AEROBIC AND ANAEROBIC Blood Culture adequate volume   Culture   Final    NO GROWTH 5 DAYS Performed at [facility], [address], [place], [place] [address]    Report Status 05/27/2024 FINAL  Final  Resp panel by RT-PCR (RSV, Flu A&B, Covid) Anterior Nasal Swab     Status: None   Collection Time: 05/22/24  4:04 PM   Specimen: Anterior Nasal Swab  Result Value Ref  Range Status   SARS Coronavirus 2 by RT PCR  NEGATIVE NEGATIVE Final   Influenza A by PCR NEGATIVE NEGATIVE Final   Influenza B by PCR NEGATIVE NEGATIVE Final    Comment: (NOTE) The Xpert Xpress SARS-CoV-2/FLU/RSV plus assay is intended as an aid in the diagnosis of influenza from Nasopharyngeal swab specimens and should not be used as a sole basis for treatment. Nasal washings and aspirates are unacceptable for Xpert Xpress SARS-CoV-2/FLU/RSV testing.  Fact Sheet for Patients: BloggerCourse.com  Fact Sheet for Healthcare Providers: SeriousBroker.it  This test is not yet approved or cleared by the [place]  FDA and has been authorized for detection and/or diagnosis of SARS-CoV-2 by FDA under an Emergency Use Authorization (EUA). This EUA will remain in effect (meaning this test can be used) for the duration of the COVID-19 declaration under Section 564(b)(1) of the Act, 21 U.S.C. section 360bbb-3(b)(1), unless the authorization is terminated or revoked.     Resp Syncytial Virus by PCR NEGATIVE NEGATIVE Final    Comment: (NOTE) Fact Sheet for Patients: BloggerCourse.com  Fact Sheet for Healthcare Providers: SeriousBroker.it  This test is not yet approved or cleared by the [place]  FDA and has been authorized for detection and/or diagnosis of SARS-CoV-2 by FDA under an Emergency Use Authorization (EUA). This EUA will remain in effect (meaning this test can be used) for the duration of the COVID-19 declaration under Section 564(b)(1) of the Act, 21 U.S.C. section 360bbb-3(b)(1), unless the authorization is terminated or revoked.  Performed at [facility], [address] [place], [place] [address]   MRSA Next Gen by PCR, Nasal     Status: None   Collection Time: 05/22/24  6:14 PM   Specimen: Nasal Mucosa; Nasal Swab  Result Value Ref Range Status   MRSA by PCR Next Gen NOT DETECTED NOT DETECTED Final     Comment: (NOTE) The GeneXpert MRSA Assay (FDA approved for NASAL specimens only), is one component of a comprehensive MRSA colonization surveillance program. It is not intended to diagnose MRSA infection nor to guide or monitor treatment for MRSA infections. Test performance is not FDA approved in patients less than 75 years old. Performed at [facility], [address], [place], [place] [address]          Radiology Studies: No results found.         LOS: 5 days   Time spent= 35 mins    Burgess JAYSON Dare, MD [facility]  If 7PM-7AM, please contact night-coverage  05/27/2024, 12:44 PM

## 2024-05-28 ENCOUNTER — Other Ambulatory Visit (HOSPITAL_COMMUNITY): Payer: Self-pay

## 2024-05-28 DIAGNOSIS — J189 Pneumonia, unspecified organism: Secondary | ICD-10-CM | POA: Diagnosis not present

## 2024-05-28 LAB — BASIC METABOLIC PANEL WITH GFR
Anion gap: 7 (ref 5–15)
BUN: 28 mg/dL — ABNORMAL HIGH (ref 8–23)
CO2: 24 mmol/L (ref 22–32)
Calcium: 9 mg/dL (ref 8.9–10.3)
Chloride: 103 mmol/L (ref 98–111)
Creatinine, Ser: 1.69 mg/dL — ABNORMAL HIGH (ref 0.44–1.00)
GFR, Estimated: 31 mL/min — ABNORMAL LOW (ref >60)
Glucose, Bld: 156 mg/dL — ABNORMAL HIGH (ref 70–99)
Potassium: 5 mmol/L (ref 3.5–5.1)
Sodium: 134 mmol/L — ABNORMAL LOW (ref 135–145)

## 2024-05-28 LAB — CBC
HCT: 30.7 % — ABNORMAL LOW (ref 36.0–46.0)
Hemoglobin: 9.7 g/dL — ABNORMAL LOW (ref 12.0–15.0)
MCH: 27.6 pg (ref 26.0–34.0)
MCHC: 31.6 g/dL (ref 30.0–36.0)
MCV: 87.5 fL (ref 80.0–100.0)
Platelets: 254 K/uL (ref 150–400)
RBC: 3.51 MIL/uL — ABNORMAL LOW (ref 3.87–5.11)
RDW: 15.3 % (ref 11.5–15.5)
WBC: 8.6 K/uL (ref 4.0–10.5)
nRBC: 0 % (ref 0.0–0.2)

## 2024-05-28 LAB — GLUCOSE, CAPILLARY
Glucose-Capillary: 143 mg/dL — ABNORMAL HIGH (ref 70–99)
Glucose-Capillary: 74 mg/dL (ref 70–99)

## 2024-05-28 LAB — PHOSPHORUS: Phosphorus: 3.8 mg/dL (ref 2.5–4.6)

## 2024-05-28 LAB — MAGNESIUM: Magnesium: 1.3 mg/dL — ABNORMAL LOW (ref 1.7–2.4)

## 2024-05-28 MED ORDER — AMOXICILLIN-POT CLAVULANATE 875-125 MG PO TABS
1.0000 | ORAL_TABLET | Freq: Two times a day (BID) | ORAL | 0 refills | Status: AC
  Filled 2024-05-28: qty 14, 7d supply, fill #0

## 2024-05-28 MED ORDER — MAGNESIUM OXIDE -MG SUPPLEMENT 400 (240 MG) MG PO TABS
800.0000 mg | ORAL_TABLET | Freq: Once | ORAL | Status: AC
  Administered 2024-05-28: 800 mg via ORAL
  Filled 2024-05-28: qty 2

## 2024-05-28 MED ORDER — DOXYCYCLINE HYCLATE 100 MG PO TABS
100.0000 mg | ORAL_TABLET | Freq: Two times a day (BID) | ORAL | 0 refills | Status: AC
  Filled 2024-05-28: qty 14, 7d supply, fill #0

## 2024-05-28 MED ORDER — MAGNESIUM SULFATE 4 GM/100ML IV SOLN
4.0000 g | Freq: Once | INTRAVENOUS | Status: AC
  Administered 2024-05-28: 4 g via INTRAVENOUS
  Filled 2024-05-28: qty 100

## 2024-05-28 NOTE — Plan of Care (Signed)
  Problem: Education: Goal: Ability to describe self-care measures that may prevent or decrease complications (Diabetes Survival Skills Education) will improve Outcome: Adequate for Discharge Goal: Individualized Educational Video(s) Outcome: Adequate for Discharge   Problem: Coping: Goal: Ability to adjust to condition or change in health will improve Outcome: Adequate for Discharge   Problem: Fluid Volume: Goal: Ability to maintain a balanced intake and output will improve Outcome: Adequate for Discharge   Problem: Health Behavior/Discharge Planning: Goal: Ability to identify and utilize available resources and services will improve Outcome: Adequate for Discharge Goal: Ability to manage health-related needs will improve Outcome: Adequate for Discharge   Problem: Metabolic: Goal: Ability to maintain appropriate glucose levels will improve Outcome: Adequate for Discharge   Problem: Nutritional: Goal: Maintenance of adequate nutrition will improve Outcome: Adequate for Discharge Goal: Progress toward achieving an optimal weight will improve Outcome: Adequate for Discharge   Problem: Skin Integrity: Goal: Risk for impaired skin integrity will decrease Outcome: Adequate for Discharge   Problem: Tissue Perfusion: Goal: Adequacy of tissue perfusion will improve Outcome: Adequate for Discharge   Problem: Education: Goal: Knowledge of General Education information will improve Description: Including pain rating scale, medication(s)/side effects and non-pharmacologic comfort measures Outcome: Adequate for Discharge   Problem: Health Behavior/Discharge Planning: Goal: Ability to manage health-related needs will improve Outcome: Adequate for Discharge   Problem: Clinical Measurements: Goal: Ability to maintain clinical measurements within normal limits will improve Outcome: Adequate for Discharge Goal: Will remain free from infection Outcome: Adequate for Discharge Goal:  Diagnostic test results will improve Outcome: Adequate for Discharge Goal: Respiratory complications will improve Outcome: Adequate for Discharge Goal: Cardiovascular complication will be avoided Outcome: Adequate for Discharge   Problem: Activity: Goal: Risk for activity intolerance will decrease Outcome: Adequate for Discharge   Problem: Nutrition: Goal: Adequate nutrition will be maintained Outcome: Adequate for Discharge   Problem: Coping: Goal: Level of anxiety will decrease Outcome: Adequate for Discharge   Problem: Elimination: Goal: Will not experience complications related to bowel motility Outcome: Adequate for Discharge Goal: Will not experience complications related to urinary retention Outcome: Adequate for Discharge   Problem: Pain Managment: Goal: General experience of comfort will improve and/or be controlled Outcome: Adequate for Discharge   Problem: Safety: Goal: Ability to remain free from injury will improve Outcome: Adequate for Discharge   Problem: Skin Integrity: Goal: Risk for impaired skin integrity will decrease Outcome: Adequate for Discharge   Problem: Education: Goal: Knowledge of disease or condition will improve Outcome: Adequate for Discharge Goal: Knowledge of the prescribed therapeutic regimen will improve Outcome: Adequate for Discharge Goal: Individualized Educational Video(s) Outcome: Adequate for Discharge   Problem: Activity: Goal: Ability to tolerate increased activity will improve Outcome: Adequate for Discharge Goal: Will verbalize the importance of balancing activity with adequate rest periods Outcome: Adequate for Discharge   Problem: Respiratory: Goal: Ability to maintain a clear airway will improve Outcome: Adequate for Discharge Goal: Levels of oxygenation will improve Outcome: Adequate for Discharge Goal: Ability to maintain adequate ventilation will improve Outcome: Adequate for Discharge   Problem:  Activity: Goal: Ability to tolerate increased activity will improve Outcome: Adequate for Discharge   Problem: Clinical Measurements: Goal: Ability to maintain a body temperature in the normal range will improve Outcome: Adequate for Discharge   Problem: Respiratory: Goal: Ability to maintain adequate ventilation will improve Outcome: Adequate for Discharge Goal: Ability to maintain a clear airway will improve Outcome: Adequate for Discharge

## 2024-05-28 NOTE — Discharge Summary (Signed)
 Physician Discharge Summary  [name] FMW:[id number] DOB: May 13, 1946 DOA: 05/22/2024  PCP: Yolande Toribio MATSU, MD  Admit date: 05/22/2024 Discharge date: 05/28/2024  Admitted From: Home Disposition: Home  Recommendations for Outpatient Follow-up:  Follow up with PCP in 1-2 weeks Please obtain BMP/CBC in one week your next doctors visit.  Augmentin  and p.o. doxycycline  prescribed 3 L of nasal cannula at home Outpatient repeat chest x-ray in 6-8 weeks  Discharge Condition: Stable CODE STATUS: Full Diet recommendation: Cardiac  Brief/Interim Summary: Brief Narrative:  78 year old with history of HTN, CHF, COPD/asthma, chronic pancreatitis, DM 2, depression, diabetes with neuropathy, PVD recently diagnosed with pneumonia a week ago and completed cefdinir.  Continued to have shortness of breath therefore admitted to the hospital.  Chest x-ray showed concerns of multifocal pneumonia therefore started on broad-spectrum antibiotics.  Also required diuretics during the hospitalization due to some signs of volume overload.  VQ scan was negative for PE and lower extremity Dopplers were negative for DVT.  Hospital course was complicated by AKI but improved with hydration. Eventually patient's respiratory status started improving.  With ambulation still required 3 L nasal cannula therefore this was arranged with home health services.  Patient today is medically stable to be discharged on p.o. antibiotics and follow-up outpatient with PCP.   Assessment & Plan:  Principal Problem:   Multifocal pneumonia Active Problems:   CKD (chronic kidney disease) stage 4, GFR 15-29 ml/min (HCC)   Chronic combined systolic and diastolic CHF (congestive heart failure) (HCC)   Dyslipidemia   Chronic depression   Iron  deficiency anemia, unspecified    Asthma   Chronic pancreatitis (HCC)   Type 2 diabetes mellitus with diabetic peripheral angiopathy without gangrene (HCC)   PVD (peripheral vascular disease)  (HCC)   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HCC)     Sepsis POA Multifocal pneumonia Acute hypoxic respiratory failure:  Recently completed antibiotics and failed outpatient treatment (  cefdinir) admitted with sepsis/multifocal pneumonia -remains hemodynamically stable leukocytosis resolved. Blood culture NGTD.  Urine antigen strep negative. MRSA PCR negative.  Leukocytosis have resolved. -Will transition patient to p.o. Augmentin  and doxycycline  to finish the course. -Ambulatory pulse ox showing hypoxia down to 78% on  RA, needing 3L [place]  Hypomagnesemia - Repleted prior to discharge   D-dimer positive: VQ scan negative, bilateral lower extremity Dopplers negative for DVT   AKI on CKD stage 4 Metabolic acidosis: Creatinine peaked around 2.4, now stabilizing around baseline of 1.7  COPD Asthma: No wheezing, continue inhalers, Singulair .   Peripheral vascular disease Carotid artery disease Cont Plavix  and statin   Hypertension Chronic diastolic CHF, EF 44% Nonsustained V. tach Leg swelling worse from baseline Hypomagnesemia: Echo updated 8/20 EF normal 55-60, G1 DD RV size normal, MV-degenerative, AV not well-visualized.  Continue home Toprol , Lasix    Chronic knee pain and ambulatory dysfunction On Chronic norco: Continue hydrocodone  at lower dose. She chronically takes it As per daughter in law/son,  she is mostly bedbound by her choice but she is ambulatory  Cont PT OT    Chronic diarrhea Chronic pancreatitis: Cont lomotil  prn   Dyslipidemia cont Lipitor.   Chronic depression: Continue home doxepin , Remeron , Effexor    Chronic Rash/eczema: Appears generalized on extremities and back with excoriation, going on for 3 months. 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 off isolation.  Previous provider discussed with infectious disease   Seizure  disorder: continue home Keppra    IDA/anemia of CKD: Hemoglobin around baseline 10.0  T2DM with diabetic peripheral angiopathy without gangrene: A1c 6.1.  Sliding scale and Accu-Cheks   Class I Obesity w/ Body mass index is 30.87 kg/m.: OSA: Will benefit with PCP follow-up, weight loss,healthy lifestyle.  Patient is noncompliant with CPAP as per home Patient is sedentary and not much mobile. She is high risk for readmission. She will benefit palliative care follow-up as outpatient  DVT prophylaxis: enoxaparin  (LOVENOX ) injection 30 mg Start: 05/22/24 2000 SCDs Start: 05/22/24 1814 Code Status:   Code Status: Full Code Family Communication:Called [place], son. Level of care: Telemetry Medical  Discharge today  PT Follow up Recs: Home Health Pt8/09/2024 1324.  Face to face completed  Subjective:  Doing ok, wishing to go home.  Denies any shortness of breath  Examination:  General exam: Appears calm and comfortable  Respiratory system: Clear to auscultation. Respiratory effort normal. Cardiovascular system: S1 & S2 heard, RRR. No JVD, murmurs, rubs, gallops or clicks. No pedal edema. Gastrointestinal system: Abdomen is nondistended, soft and nontender. No organomegaly or masses felt. Normal bowel sounds heard. Central nervous system: Alert and oriented. No focal neurological deficits. Extremities: Symmetric 5 x 5 power. Skin: No rashes, lesions or ulcers Psychiatry: Judgement and insight appear normal. Mood & affect appropriate.    Discharge Diagnoses:  Principal Problem:   Multifocal pneumonia Active Problems:   CKD (chronic kidney disease) stage 4, GFR 15-29 ml/min (HCC)   Chronic combined systolic and diastolic CHF (congestive heart failure) (HCC)   Dyslipidemia   Chronic depression   Iron  deficiency anemia, unspecified    Asthma   Chronic pancreatitis (HCC)   Type 2 diabetes mellitus with diabetic peripheral angiopathy without gangrene (HCC)   PVD (peripheral  vascular disease) (HCC)   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HCC)      Discharge Exam: Vitals:   05/28/24 0400 05/28/24 0807  BP: (!) 152/64 135/64  Pulse:  79  Resp:  19  Temp: 97.9 F (36.6 C) 97.7 F (36.5 C)  SpO2: 100% 99%   Vitals:   05/27/24 0837 05/27/24 1513 05/28/24 0400 05/28/24 0807  BP:  (!) 155/61 (!) 152/64 135/64  Pulse:  70  79  Resp:  20  19  Temp:  98.2 F (36.8 C) 97.9 F (36.6 C) 97.7 F (36.5 C)  TempSrc:  Oral Oral   SpO2: 99% 100% 100% 99%  Weight:      Height:          Discharge Instructions   Allergies as of 05/28/2024       Reactions   Adhesive [tape] Other (See Comments)   Took off my skin   Other Rash   Took off my skin   Ace Inhibitors Other (See Comments)   Unknown   Clonidine     Resulted in excessive sleepiness/somnolence   Sulfa Antibiotics Other (See Comments)   Unknown   Topiramate    unknown   Codeine Other (See Comments)   Altered mental state    Iodinated Contrast Media Rash   Latex Rash        Medication List     TAKE these medications    albuterol  108 (90 Base) MCG/ACT inhaler Commonly known as: VENTOLIN  HFA Inhale 1-2 puffs into the lungs every 6 (six) hours as needed for wheezing or shortness of breath.   amLODipine  10 MG tablet Commonly known as: NORVASC  Take 1 tablet (10 mg total) by mouth daily.   amoxicillin -clavulanate  875-125 MG tablet Commonly known as: AUGMENTIN  Take 1 tablet by mouth 2 (two) times daily for 7 days.   atorvastatin  40 MG tablet Commonly known as: LIPITOR Take 1 tablet (40 mg total) by mouth daily.   clopidogrel  75 MG tablet Commonly known as: PLAVIX  Take 1 tablet (75 mg total) by mouth daily.   cyanocobalamin  500 MCG tablet Commonly known as: VITAMIN B12 Take 500 mcg by mouth daily.   diphenoxylate -atropine  2.5-0.025 MG tablet Commonly known as: LOMOTIL  Take 1 tablet by mouth 4 (four) times daily as needed.   doxepin  10 MG/ML solution Commonly known  as: SINEQUAN  Take 0.3 mLs by mouth at bedtime.   doxycycline  100 MG tablet Commonly known as: VIBRA -TABS Take 1 tablet (100 mg total) by mouth every 12 (twelve) hours for 7 days.   feeding supplement Liqd Take 237 mLs by mouth 2 (two) times daily between meals.   ferrous gluconate  324 MG tablet Commonly known as: FERGON Take 1 tablet (324 mg total) by mouth daily with breakfast.   fluticasone  50 MCG/ACT nasal spray Commonly known as: FLONASE  Place 2 sprays into both nostrils daily as needed for allergies.   furosemide  20 MG tablet Commonly known as: LASIX  Take 1 tablet (20 mg total) by mouth daily as needed.   HYDROcodone -acetaminophen  10-325 MG tablet Commonly known as: NORCO Take 1 tablet by mouth 4 (four) times daily as needed.   levETIRAcetam  500 MG tablet Commonly known as: KEPPRA  TAKE 1 TABLET BY MOUTH TWICE A DAY   meclizine  25 MG tablet Commonly known as: ANTIVERT  Take 0.5 tablets (12.5 mg total) by mouth 3 (three) times daily as needed for dizziness.   metoprolol  succinate 100 MG 24 hr tablet Commonly known as: TOPROL -XL Take 100 mg by mouth daily.   mirtazapine  15 MG tablet Commonly known as: REMERON  Take 15 mg by mouth at bedtime.   montelukast  10 MG tablet Commonly known as: SINGULAIR  Take 10 mg by mouth daily as needed.   mupirocin  ointment 2 % Commonly known as: BACTROBAN  Apply 1 Application topically 2 (two) times daily.   nitroGLYCERIN  0.4 MG SL tablet Commonly known as: NITROSTAT  Place 1 tablet (0.4 mg total) under the tongue every 5 (five) minutes as needed for chest pain.   nystatin-triamcinolone  cream Commonly known as: MYCOLOG II Apply 1 Application topically 2 (two) times daily.   ondansetron  4 MG tablet Commonly known as: ZOFRAN  Take 1 tablet (4 mg total) by mouth every 6 (six) hours as needed for nausea or vomiting.   pantoprazole  40 MG tablet Commonly known as: PROTONIX  Take 1 tablet (40 mg total) by mouth 2 (two) times daily.    venlafaxine  XR 75 MG 24 hr capsule Commonly known as: EFFEXOR -XR Take 1 capsule (75 mg total) by mouth daily.   Vitamin D  (Ergocalciferol ) 1.25 MG (50000 UNIT) Caps capsule Commonly known as: DRISDOL  Take 50,000 Units by mouth every Wednesday.               Durable Medical Equipment  (From admission, onward)           Start     Ordered   05/27/24 1040  For home use only DME oxygen  Once       Comments: Dx: Pneumonia  Question Answer Comment  Length of Need Lifetime   Mode or (Route) Nasal cannula   Liters per Minute 3   Oxygen delivery system Gas      05/27/24 1039  Follow-up Information     Yolande Toribio MATSU, MD Follow up.   Specialty: Internal Medicine Contact information: [address] [place] [address] [phone]         [facility] Follow up.   Specialty: Home Health Services Why: home health services will be provided by [facility] Contact information: [address] [place] [address] [phone]         Yolande Toribio MATSU, MD .   Specialty: Internal Medicine Contact information: [address] [place] [address] [phone]                Allergies  Allergen Reactions   Adhesive [Tape] Other (See Comments)    Took off my skin   Other Rash    Took off my skin   Ace Inhibitors Other (See Comments)    Unknown   Clonidine      Resulted in excessive sleepiness/somnolence   Sulfa Antibiotics Other (See Comments)    Unknown   Topiramate     unknown   Codeine Other (See Comments)    Altered mental state    Iodinated Contrast Media Rash   Latex Rash    You were cared for by a hospitalist during your hospital stay. If you have any questions about your discharge medications or the care you received while you were in the hospital after you are discharged, you can call the unit and asked to speak with the hospitalist on call if the hospitalist that took care of you  is not available. Once you are discharged, your primary care physician will handle any further medical issues. Please note that no refills for any discharge medications will be authorized once you are discharged, as it is imperative that you return to your primary care physician (or establish a relationship with a primary care physician if you do not have one) for your aftercare needs so that they can reassess your need for medications and monitor your lab values.  You were cared for by a hospitalist during your hospital stay. If you have any questions about your discharge medications or the care you received while you were in the hospital after you are discharged, you can call the unit and asked to speak with the hospitalist on call if the hospitalist that took care of you is not available. Once you are discharged, your primary care physician will handle any further medical issues. Please note that NO REFILLS for any discharge medications will be authorized once you are discharged, as it is imperative that you return to your primary care physician (or establish a relationship with a primary care physician if you do not have one) for your aftercare needs so that they can reassess your need for medications and monitor your lab values.  Please request your Prim.MD to go over all Hospital Tests and Procedure/Radiological results at the follow up, please get all Hospital records sent to your Prim MD by signing hospital release before you go home.  Get CBC, CMP, 2 view Chest X ray checked  by Primary MD during your next visit or SNF MD in 5-7 days ( we routinely change or add medications that can affect your baseline labs and fluid status, therefore we recommend that you get the mentioned basic workup next visit with your PCP, your PCP may decide not to get them or add new tests based on their clinical decision)  On your next visit with your primary care physician please Get  Medicines reviewed and adjusted.  If  you experience worsening of your admission symptoms, develop shortness of breath, life threatening emergency, suicidal or homicidal thoughts you must seek medical attention immediately by calling 911 or calling your MD immediately  if symptoms less severe.  You Must read complete instructions/literature along with all the possible adverse reactions/side effects for all the Medicines you take and that have been prescribed to you. Take any new Medicines after you have completely understood and accpet all the possible adverse reactions/side effects.   Do not drive, operate heavy machinery, perform activities at heights, swimming or participation in water  activities or provide baby sitting services if your were admitted for syncope or siezures until you have seen by Primary MD or a Neurologist and advised to do so again.  Do not drive when taking Pain medications.   Procedures/Studies: ECHOCARDIOGRAM COMPLETE Result Date: 05/23/2024    ECHOCARDIOGRAM REPORT   Patient Name:   [name] Date of Exam: 05/23/2024 Medical Rec #:  [id number]      Height:       61.0 in Accession #:    [id number]     Weight:       163.4 lb Date of Birth:  Jun 01, 1946      BSA:          1.733 m Patient Age:    77 years       BP:           165/57 mmHg Patient Gender: F              HR:           55 bpm. Exam Location:  Inpatient Procedure: 2D Echo, Cardiac Doppler and Color Doppler (Both Spectral and Color            Flow Doppler were utilized during procedure). Indications:    CHF  History:        Patient has prior history of Echocardiogram examinations, most                 recent 06/13/2023. CHF, CAD; Risk Factors:Hypertension, Diabetes,                 Dyslipidemia and Sleep Apnea. Ckd stage 4.  Sonographer:    Philomena Daring Referring Phys: RAMESH KC IMPRESSIONS  1. Left ventricular ejection fraction, by estimation, is 55 to 60%. The left ventricle has normal function. Left ventricular endocardial border not optimally defined to  evaluate regional wall motion. Left ventricular diastolic parameters are consistent with Grade I diastolic dysfunction (impaired relaxation).  2. Right ventricular systolic function was not well visualized. The right ventricular size is normal. Tricuspid regurgitation signal is inadequate for assessing PA pressure.  3. The mitral valve is degenerative. No evidence of mitral valve regurgitation. No evidence of mitral stenosis.  4. The aortic valve was not well visualized. There is moderate calcification of the aortic valve. Aortic valve regurgitation is not visualized. No aortic stenosis is present.  5. The inferior vena cava is dilated in size with >50% respiratory variability, suggesting right atrial pressure of 8 mmHg. Comparison(s): No significant change from prior study. FINDINGS  Left Ventricle: Left ventricular ejection fraction, by estimation, is 55 to 60%. The left ventricle has normal function. Left ventricular endocardial border not optimally defined to evaluate regional wall motion. Strain was performed and the global longitudinal strain is indeterminate. The left ventricular internal cavity size was normal in size. There is no left ventricular hypertrophy. Left ventricular  diastolic parameters are consistent with Grade I diastolic dysfunction (impaired relaxation). Normal left ventricular filling pressure. Right Ventricle: The right ventricular size is normal. No increase in right ventricular wall thickness. Right ventricular systolic function was not well visualized. Tricuspid regurgitation signal is inadequate for assessing PA pressure. Left Atrium: Left atrial size was normal in size. Right Atrium: Right atrial size was normal in size. Pericardium: There is no evidence of pericardial effusion. Mitral Valve: The mitral valve is degenerative in appearance. No evidence of mitral valve regurgitation. No evidence of mitral valve stenosis. Tricuspid Valve: The tricuspid valve is not well visualized.  Tricuspid valve regurgitation is not demonstrated. No evidence of tricuspid stenosis. Aortic Valve: The aortic valve was not well visualized. There is moderate calcification of the aortic valve. Aortic valve regurgitation is not visualized. No aortic stenosis is present. Pulmonic Valve: The pulmonic valve was not well visualized. Pulmonic valve regurgitation is not visualized. No evidence of pulmonic stenosis. Aorta: The aortic root and ascending aorta are structurally normal, with no evidence of dilitation. Venous: The inferior vena cava is dilated in size with greater than 50% respiratory variability, suggesting right atrial pressure of 8 mmHg. IAS/Shunts: No atrial level shunt detected by color flow Doppler. Additional Comments: 3D was performed not requiring image post processing on an independent workstation and was indeterminate.  LEFT VENTRICLE PLAX 2D LVIDd:         4.70 cm     Diastology LVIDs:         3.40 cm     LV e' medial:    8.16 cm/s LV PW:         1.20 cm     LV E/e' medial:  13.6 LV IVS:        1.10 cm     LV e' lateral:   7.51 cm/s LVOT diam:     1.80 cm     LV E/e' lateral: 14.8 LV SV:         42 LV SV Index:   24 LVOT Area:     2.54 cm  LV Volumes (MOD) LV vol d, MOD A2C: 70.9 ml LV vol d, MOD A4C: 97.7 ml LV vol s, MOD A2C: 21.7 ml LV vol s, MOD A4C: 36.3 ml LV SV MOD A2C:     49.2 ml LV SV MOD A4C:     97.7 ml LV SV MOD BP:      55.0 ml RIGHT VENTRICLE            IVC RV S prime:     8.05 cm/s  IVC diam: 2.30 cm TAPSE (M-mode): 1.6 cm LEFT ATRIUM             Index        RIGHT ATRIUM           Index LA diam:        4.00 cm 2.31 cm/m   RA Area:     12.50 cm LA Vol (A2C):   50.0 ml 28.85 ml/m  RA Volume:   25.30 ml  14.60 ml/m LA Vol (A4C):   43.4 ml 25.04 ml/m LA Biplane Vol: 49.6 ml 28.62 ml/m  AORTIC VALVE LVOT Vmax:   75.50 cm/s LVOT Vmean:  50.300 cm/s LVOT VTI:    0.166 m  AORTA Ao Root diam: 2.50 cm Ao Asc diam:  3.10 cm MITRAL VALVE MV Area (PHT): 3.77 cm     SHUNTS MV Decel Time:  201 msec  Systemic VTI:  0.17 m MV E velocity: 111.00 cm/s  Systemic Diam: 1.80 cm MV A velocity: 106.00 cm/s MV E/A ratio:  1.05 Vishnu Priya Mallipeddi Electronically signed by Diannah Late Mallipeddi Signature Date/Time: 05/23/2024/4:09:45 PM    Final    VAS US  LOWER EXTREMITY VENOUS (DVT) Result Date: 05/23/2024  Lower Venous DVT Study Patient Name:  LEXIANNA WEINRICH  Date of Exam:   05/23/2024 Medical Rec #: [id number]       Accession #:    [id number] Date of Birth: 10-Sep-1946       Patient Gender: F Patient Age:   48 years Exam Location:  [facility] Procedure:      VAS US  LOWER EXTREMITY VENOUS (DVT) Referring Phys: RAMESH KC --------------------------------------------------------------------------------  Indications: Edema.  Performing Technologist: Elmarie Lindau, RVT  Examination Guidelines: A complete evaluation includes B-mode imaging, spectral Doppler, color Doppler, and power Doppler as needed of all accessible portions of each vessel. Bilateral testing is considered an integral part of a complete examination. Limited examinations for reoccurring indications may be performed as noted. The reflux portion of the exam is performed with the patient in reverse Trendelenburg.  +---------+---------------+---------+-----------+----------+--------------+ RIGHT    CompressibilityPhasicitySpontaneityPropertiesThrombus Aging +---------+---------------+---------+-----------+----------+--------------+ CFV      Full           Yes      Yes                                 +---------+---------------+---------+-----------+----------+--------------+ SFJ      Full                                                        +---------+---------------+---------+-----------+----------+--------------+ FV Prox  Full                                                        +---------+---------------+---------+-----------+----------+--------------+ FV Mid   Full                                                         +---------+---------------+---------+-----------+----------+--------------+ FV DistalFull                                                        +---------+---------------+---------+-----------+----------+--------------+ PFV      Full                                                        +---------+---------------+---------+-----------+----------+--------------+ POP      Full           Yes      Yes                                 +---------+---------------+---------+-----------+----------+--------------+  PTV      Full                                                        +---------+---------------+---------+-----------+----------+--------------+ PERO     Full                                                        +---------+---------------+---------+-----------+----------+--------------+   +---------+---------------+---------+-----------+----------+--------------+ LEFT     CompressibilityPhasicitySpontaneityPropertiesThrombus Aging +---------+---------------+---------+-----------+----------+--------------+ CFV      Full           Yes      Yes                                 +---------+---------------+---------+-----------+----------+--------------+ SFJ      Full                                                        +---------+---------------+---------+-----------+----------+--------------+ FV Prox  Full                                                        +---------+---------------+---------+-----------+----------+--------------+ FV Mid   Full                                                        +---------+---------------+---------+-----------+----------+--------------+ FV DistalFull                                                        +---------+---------------+---------+-----------+----------+--------------+ PFV      Full                                                         +---------+---------------+---------+-----------+----------+--------------+ POP      Full           Yes      Yes                                 +---------+---------------+---------+-----------+----------+--------------+ PTV      Full                                                        +---------+---------------+---------+-----------+----------+--------------+  PERO     Full                                                        +---------+---------------+---------+-----------+----------+--------------+     Summary: RIGHT: - There is no evidence of deep vein thrombosis in the lower extremity.  - No cystic structure found in the popliteal fossa.  LEFT: - There is no evidence of deep vein thrombosis in the lower extremity.  - No cystic structure found in the popliteal fossa.  *See table(s) above for measurements and observations. Electronically signed by Norman Serve on 05/23/2024 at 4:08:01 PM.    Final    NM Pulmonary Perfusion Result Date: 05/23/2024 CLINICAL DATA:  Provided history: Pulmonary embolism (PE) suspected, high prob Hypoxia and shortness of breath. EXAM: NUCLEAR MEDICINE PERFUSION LUNG SCAN TECHNIQUE: Perfusion images were obtained in multiple projections after intravenous injection of radiopharmaceutical. Ventilation scans intentionally deferred if perfusion scan and chest x-ray adequate for interpretation during COVID 19 epidemic. RADIOPHARMACEUTICALS:  4.3 mCi Tc-23m MAA IV COMPARISON:  Chest radiograph yesterday FINDINGS: Generalized diminished perfusion in the right mid and lower lung zone. No peripheral or wedge-shaped perfusion defects. Normal homogeneous perfusion to the left lung. IMPRESSION: No scintigraphic findings of pulmonary embolus. Generalized non wedge-shaped diminished perfusion in the right mid and lower lung zone corresponds to site of airspace disease on radiograph. Electronically Signed   By: Andrea Gasman M.D.   On: 05/23/2024 15:58   DG Chest 2  View Result Date: 05/22/2024 CLINICAL DATA:  SOB; hypoxia EXAM: CHEST - 2 VIEW COMPARISON:  Chest x-ray 06/09/2023, CT chest 06/04/2023 FINDINGS: The heart and mediastinal contours are within normal limits. Atherosclerotic plaque. Persistent coarsened interstitial markings with interval double low bone of right lung, lower lobe predominant, airspace opacity. No pleural effusion. No pneumothorax. No acute osseous abnormality. IMPRESSION: 1. Multifocal pneumonia of the right lung. Followup PA and lateral chest X-ray is recommended in 3-4 weeks following therapy to ensure resolution and exclude underlying malignancy. 2. Aortic Atherosclerosis (ICD10-I70.0) and Emphysema (ICD10-J43.9). Electronically Signed   By: Morgane  Naveau M.D.   On: 05/22/2024 15:44     The results of significant diagnostics from this hospitalization (including imaging, microbiology, ancillary and laboratory) are listed below for reference.     Microbiology: Recent Results (from the past 240 hours)  Blood culture (routine x 2)     Status: None   Collection Time: 05/22/24  4:01 PM   Specimen: BLOOD  Result Value Ref Range Status   Specimen Description BLOOD RIGHT ANTECUBITAL  Final   Special Requests   Final    BOTTLES DRAWN AEROBIC AND ANAEROBIC Blood Culture adequate volume   Culture   Final    NO GROWTH 5 DAYS Performed at [facility], [address] [place], [place] [address]    Report Status 05/27/2024 FINAL  Final  Blood culture (routine x 2)     Status: None   Collection Time: 05/22/24  4:04 PM   Specimen: BLOOD LEFT ARM  Result Value Ref Range Status   Specimen Description BLOOD LEFT ARM  Final   Special Requests   Final    BOTTLES DRAWN AEROBIC AND ANAEROBIC Blood Culture adequate volume   Culture   Final    NO GROWTH 5 DAYS Performed at [facility],  [address], [place], [place] [address]    Report Status 05/27/2024 FINAL  Final  Resp panel by RT-PCR (RSV, Flu A&B, Covid) Anterior Nasal  Swab     Status: None   Collection Time: 05/22/24  4:04 PM   Specimen: Anterior Nasal Swab  Result Value Ref Range Status   SARS Coronavirus 2 by RT PCR NEGATIVE NEGATIVE Final   Influenza A by PCR NEGATIVE NEGATIVE Final   Influenza B by PCR NEGATIVE NEGATIVE Final    Comment: (NOTE) The Xpert Xpress SARS-CoV-2/FLU/RSV plus assay is intended as an aid in the diagnosis of influenza from Nasopharyngeal swab specimens and should not be used as a sole basis for treatment. Nasal washings and aspirates are unacceptable for Xpert Xpress SARS-CoV-2/FLU/RSV testing.  Fact Sheet for Patients: BloggerCourse.com  Fact Sheet for Healthcare Providers: SeriousBroker.it  This test is not yet approved or cleared by the [place]  FDA and has been authorized for detection and/or diagnosis of SARS-CoV-2 by FDA under an Emergency Use Authorization (EUA). This EUA will remain in effect (meaning this test can be used) for the duration of the COVID-19 declaration under Section 564(b)(1) of the Act, 21 U.S.C. section 360bbb-3(b)(1), unless the authorization is terminated or revoked.     Resp Syncytial Virus by PCR NEGATIVE NEGATIVE Final    Comment: (NOTE) Fact Sheet for Patients: BloggerCourse.com  Fact Sheet for Healthcare Providers: SeriousBroker.it  This test is not yet approved or cleared by the [place]  FDA and has been authorized for detection and/or diagnosis of SARS-CoV-2 by FDA under an Emergency Use Authorization (EUA). This EUA will remain in effect (meaning this test can be used) for the duration of the COVID-19 declaration under Section 564(b)(1) of the Act, 21 U.S.C. section 360bbb-3(b)(1), unless the authorization is terminated or revoked.  Performed at [facility], [address], [place], [place] [address]   MRSA Next Gen by PCR, Nasal     Status: None    Collection Time: 05/22/24  6:14 PM   Specimen: Nasal Mucosa; Nasal Swab  Result Value Ref Range Status   MRSA by PCR Next Gen NOT DETECTED NOT DETECTED Final    Comment: (NOTE) The GeneXpert MRSA Assay (FDA approved for NASAL specimens only), is one component of a comprehensive MRSA colonization surveillance program. It is not intended to diagnose MRSA infection nor to guide or monitor treatment for MRSA infections. Test performance is not FDA approved in patients less than 77 years old. Performed at [facility], [address] [place], [place] [address]      Labs: BNP (last 3 results) Recent Labs    06/04/23 0625 05/22/24 1558 05/27/24 0930  BNP 70.7 176.8* 206.5*   Basic Metabolic Panel: Recent Labs  Lab 05/24/24 0659 05/24/24 0700 05/25/24 0544 05/26/24 0431 05/27/24 0526 05/28/24 0846  NA  --  138 134* 136 135 134*  K  --  3.5 3.9 3.9 4.2 5.0  CL  --  105 102 103 104 103  CO2  --  23 22 23 23 24   GLUCOSE  --  87 86 89 88 156*  BUN  --  29* 25* 27* 29* 28*  CREATININE  --  1.84* 1.57* 1.70* 1.63* 1.69*  CALCIUM   --  8.4* 8.6* 9.0 9.0 9.0  MG 1.1*  --   --   --   --  1.3*  PHOS  --   --   --   --   --  3.8  Liver Function Tests: Recent Labs  Lab 05/22/24 1604  AST 36  ALT 15  ALKPHOS 105  BILITOT 1.0  PROT 5.9*  ALBUMIN  2.5*   No results for input(s): LIPASE, AMYLASE in the last 168 hours. No results for input(s): AMMONIA in the last 168 hours. CBC: Recent Labs  Lab 05/22/24 1604 05/22/24 1611 05/24/24 0700 05/25/24 0544 05/26/24 0431 05/27/24 0526 05/28/24 0846  WBC 36.6*   < > 20.5* 14.1* 10.9* 9.8 8.6  NEUTROABS 30.7*  --   --   --   --   --   --   HGB 10.3*   < > 9.9* 9.5* 9.7* 10.0* 9.7*  HCT 32.2*   < > 31.1* 29.4* 29.7* 31.4* 30.7*  MCV 88.5   < > 87.9 86.2 86.6 87.0 87.5  PLT 236   < > 213 219 225 241 254   < > = values in this interval not displayed.   Cardiac Enzymes: No results for input(s): CKTOTAL, CKMB,  CKMBINDEX, TROPONINI in the last 168 hours. BNP: Invalid input(s): POCBNP CBG: Recent Labs  Lab 05/26/24 2136 05/27/24 0823 05/27/24 1101 05/27/24 2114 05/28/24 0845  GLUCAP 147* 88 115* 176* 143*   D-Dimer No results for input(s): DDIMER in the last 72 hours. Hgb A1c No results for input(s): HGBA1C in the last 72 hours. Lipid Profile No results for input(s): CHOL, HDL, LDLCALC, TRIG, CHOLHDL, LDLDIRECT in the last 72 hours. Thyroid  function studies No results for input(s): TSH, T4TOTAL, T3FREE, THYROIDAB in the last 72 hours.  Invalid input(s): FREET3 Anemia work up No results for input(s): VITAMINB12, FOLATE, FERRITIN, TIBC, IRON , RETICCTPCT in the last 72 hours. Urinalysis    Component Value Date/Time   COLORURINE AMBER (A) 04/15/2023 2010   APPEARANCEUR CLOUDY (A) 04/15/2023 2010   LABSPEC 1.017 04/15/2023 2010   PHURINE 5.0 04/15/2023 2010   GLUCOSEU NEGATIVE 04/15/2023 2010   HGBUR LARGE (A) 04/15/2023 2010   BILIRUBINUR NEGATIVE 04/15/2023 2010   KETONESUR NEGATIVE 04/15/2023 2010   PROTEINUR 100 (A) 04/15/2023 2010   NITRITE NEGATIVE 04/15/2023 2010   LEUKOCYTESUR LARGE (A) 04/15/2023 2010   Sepsis Labs Recent Labs  Lab 05/25/24 0544 05/26/24 0431 05/27/24 0526 05/28/24 0846  WBC 14.1* 10.9* 9.8 8.6   Microbiology Recent Results (from the past 240 hours)  Blood culture (routine x 2)     Status: None   Collection Time: 05/22/24  4:01 PM   Specimen: BLOOD  Result Value Ref Range Status   Specimen Description BLOOD RIGHT ANTECUBITAL  Final   Special Requests   Final    BOTTLES DRAWN AEROBIC AND ANAEROBIC Blood Culture adequate volume   Culture   Final    NO GROWTH 5 DAYS Performed at [facility], [address], [place], [place] [address]    Report Status 05/27/2024 FINAL  Final  Blood culture (routine x 2)     Status: None   Collection Time: 05/22/24  4:04 PM   Specimen: BLOOD LEFT ARM  Result  Value Ref Range Status   Specimen Description BLOOD LEFT ARM  Final   Special Requests   Final    BOTTLES DRAWN AEROBIC AND ANAEROBIC Blood Culture adequate volume   Culture   Final    NO GROWTH 5 DAYS Performed at [facility], [address], [place], [place] [address]    Report Status 05/27/2024 FINAL  Final  Resp panel by RT-PCR (RSV, Flu A&B, Covid) Anterior Nasal Swab  Status: None   Collection Time: 05/22/24  4:04 PM   Specimen: Anterior Nasal Swab  Result Value Ref Range Status   SARS Coronavirus 2 by RT PCR NEGATIVE NEGATIVE Final   Influenza A by PCR NEGATIVE NEGATIVE Final   Influenza B by PCR NEGATIVE NEGATIVE Final    Comment: (NOTE) The Xpert Xpress SARS-CoV-2/FLU/RSV plus assay is intended as an aid in the diagnosis of influenza from Nasopharyngeal swab specimens and should not be used as a sole basis for treatment. Nasal washings and aspirates are unacceptable for Xpert Xpress SARS-CoV-2/FLU/RSV testing.  Fact Sheet for Patients: BloggerCourse.com  Fact Sheet for Healthcare Providers: SeriousBroker.it  This test is not yet approved or cleared by the [place]  FDA and has been authorized for detection and/or diagnosis of SARS-CoV-2 by FDA under an Emergency Use Authorization (EUA). This EUA will remain in effect (meaning this test can be used) for the duration of the COVID-19 declaration under Section 564(b)(1) of the Act, 21 U.S.C. section 360bbb-3(b)(1), unless the authorization is terminated or revoked.     Resp Syncytial Virus by PCR NEGATIVE NEGATIVE Final    Comment: (NOTE) Fact Sheet for Patients: BloggerCourse.com  Fact Sheet for Healthcare Providers: SeriousBroker.it  This test is not yet approved or cleared by the [place]  FDA and has been authorized for detection and/or diagnosis of SARS-CoV-2 by FDA under an Emergency Use  Authorization (EUA). This EUA will remain in effect (meaning this test can be used) for the duration of the COVID-19 declaration under Section 564(b)(1) of the Act, 21 U.S.C. section 360bbb-3(b)(1), unless the authorization is terminated or revoked.  Performed at [facility], [address], [place], [place] [address]   MRSA Next Gen by PCR, Nasal     Status: None   Collection Time: 05/22/24  6:14 PM   Specimen: Nasal Mucosa; Nasal Swab  Result Value Ref Range Status   MRSA by PCR Next Gen NOT DETECTED NOT DETECTED Final    Comment: (NOTE) The GeneXpert MRSA Assay (FDA approved for NASAL specimens only), is one component of a comprehensive MRSA colonization surveillance program. It is not intended to diagnose MRSA infection nor to guide or monitor treatment for MRSA infections. Test performance is not FDA approved in patients less than 41 years old. Performed at [facility], [address], [place], [place] [address]      Time coordinating discharge:  I have spent 35 minutes face to face with the patient and on the ward discussing the patients care, assessment, plan and disposition with other care givers. >50% of the time was devoted counseling the patient about the risks and benefits of treatment/Discharge disposition and coordinating care.   SIGNED:   Burgess JAYSON Dare, MD  [facility] 05/28/2024, 12:27 PM   If 7PM-7AM, please contact night-coverage

## 2024-05-28 NOTE — Progress Notes (Signed)
 Mobility Specialist Progress Note:    05/28/24 1400  Mobility  Activity Ambulated with assistance  Level of Assistance Standby assist, set-up cues, supervision of patient - no hands on  Assistive Device Front wheel walker  Distance Ambulated (ft) 150 ft  Activity Response Tolerated well  Mobility Referral Yes  Mobility visit 1 Mobility  Mobility Specialist Start Time (ACUTE ONLY) 0930  Mobility Specialist Stop Time (ACUTE ONLY) 0945  Mobility Specialist Time Calculation (min) (ACUTE ONLY) 15 min   Pt received in BR agreeable to mobility. Pt was found w/ no [place] on and Spo2 was 78%. O2 flow increased to 3L/min Spo2 WFL. Was able to ambulate on 3L/min w/ 2 standing rest breaks. Returned to room w/o fault. Left in chair w/ call bell and personal belongings in reach. All needs met. Chair alarm on.  Thersia Minder Mobility Specialist  Please contact vis Secure Chat or  Rehab Office [phone]

## 2024-05-31 LAB — GLUCOSE, CAPILLARY: Glucose-Capillary: 56 mg/dL — ABNORMAL LOW (ref 70–99)

## 2024-05-31 NOTE — Therapy (Incomplete)
 OUTPATIENT SPEECH LANGUAGE PATHOLOGY SWALLOW EVALUATION   Patient Name: [name] MRN: [id number] DOB:[birth year], 78 y.o., female Today's Date: 05/31/2024  PCP: Toribio Endo, MD REFERRING PROVIDER: Camie Sevin, PA-C  END OF SESSION:   Past Medical History:  Diagnosis Date   Anxiety    Arthritis    back    Blind loop syndrome    Clotting disorder (HCC)    Hx Clot in leg per pt    Colostomy present [facility])    Coronary artery disease    a. known 60-70% mid-LAD stenosis with caths in 1999, 2002, 2006, and 2012 showing stable anatomy b. 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HCC)    Diabetes mellitus    Diverticulosis    Dizziness    chronic   Dyslipidemia    Fatty liver    GERD (gastroesophageal reflux disease)    hiatial hernia   Heart murmur    Hiatal hernia    Hypertension    Irritable bowel syndrome    Pancreatitis, chronic (HCC)    Peripheral vascular disease (HCC) 02/20/2010   s/p stent of left SFA; carotid stenosis   Seizure (HCC) 11/04/2018   last 1 yr 2020 per pt unsure month    Sleep apnea    no cpap    Stroke (HCC)    Thyroid  nodule    Vitamin B12 deficiency    Past Surgical History:  Procedure Laterality Date   ABDOMINAL HYSTERECTOMY     partial   AUGMENTATION MAMMAPLASTY     bilateral 1976 retro    BREAST ENHANCEMENT SURGERY     CARDIAC CATHETERIZATION  07/07/98;08/31/01;03/04/05   60 -70% LAD   CATARACT EXTRACTION Bilateral    COLON RESECTION N/A 10/14/2017   Procedure: EXPLORATORY LAPAROTOMY, EXTENDED RIGHT COLECTOMY; APPLICATION OF ABDOMINAL VACUUM DRESSING;  Surgeon: Curvin Deward MOULD, MD;  Location: [facility] ORS;  Service: General;  Laterality: N/A;   COLONOSCOPY     CORONARY ATHERECTOMY N/A 02/12/2023   Procedure: CORONARY ATHERECTOMY;  Surgeon: Swaziland, Peter M, MD;  Location: [facility] INVASIVE CV LAB;  Service: Cardiovascular;  Laterality: N/A;   CORONARY STENT INTERVENTION N/A 02/12/2023   Procedure: CORONARY STENT INTERVENTION;   Surgeon: Swaziland, Peter M, MD;  Location: [facility] INVASIVE CV LAB;  Service: Cardiovascular;  Laterality: N/A;   CORONARY ULTRASOUND/IVUS N/A 02/12/2023   Procedure: Coronary Ultrasound/IVUS;  Surgeon: Swaziland, Peter M, MD;  Location: [facility] INVASIVE CV LAB;  Service: Cardiovascular;  Laterality: N/A;   ENDOBRONCHIAL ULTRASOUND Bilateral 02/24/2017   Procedure: ENDOBRONCHIAL ULTRASOUND;  Surgeon: Shelah Lamar RAMAN, MD;  Location: [facility] ENDOSCOPY;  Service: Cardiopulmonary;  Laterality: Bilateral;   FEMORAL ARTERY STENT     Left leg   LAPAROTOMY N/A 10/16/2017   Procedure: EXPLORATORY LAPAROTOMY, PARTIAL OMENTECTOMY, RESECTION ISCHEMIC ILEUM 130CM, APPLICATION OF VAC ABDOMINAL DRESSING;  Surgeon: Eletha Boas, MD;  Location: [facility] ORS;  Service: General;  Laterality: N/A;   LAPAROTOMY N/A 10/18/2017   Procedure: RE-EXPLORATION OF ABDOMEN, ILEOSTOMY CREATION;  Surgeon: Aron Shoulders, MD;  Location: [facility] ORS;  Service: General;  Laterality: N/A;   LEFT HEART CATH AND CORONARY ANGIOGRAPHY N/A 01/29/2023   Procedure: LEFT HEART CATH AND CORONARY ANGIOGRAPHY;  Surgeon: Swaziland, Peter M, MD;  Location: [facility] INVASIVE CV LAB;  Service: Cardiovascular;  Laterality: N/A;   LUNG SURGERY  03/2017   Benign polyps removed   PATELLA REALIGNMENT Left    POLYPECTOMY     SIGMOIDOSCOPY     TONSILLECTOMY     TRANSCAROTID  ARTERY REVASCULARIZATION  Right 04/18/2022   Procedure: Transcarotid Artery Revascularization;  Surgeon: Lanis Fonda BRAVO, MD;  Location: [facility] OR;  Service: Vascular;  Laterality: Right;   Patient Active Problem Strong   Diagnosis Date Noted   Multifocal pneumonia 05/22/2024   Memory impairment 04/09/2024   Lower GI bleed 07/01/2023   History of coronary angioplasty with insertion of stent 07/01/2023   History of stroke in prior 3 months 07/01/2023   Coronary artery disease involving native coronary artery of native heart without angina pectoris 07/01/2023   Acute respiratory failure with hypoxia (HCC) 06/04/2023    Confusion 04/15/2023   Rhabdomyolysis 04/15/2023   Unstable angina (HCC) 01/29/2023   TIA (transient ischemic attack) 01/02/2023   Acute CVA (cerebrovascular accident) (HCC) 04/15/2022   Hypertensive urgency 04/09/2022   History of seizures as a child 04/09/2022   Chronic combined systolic and diastolic CHF (congestive heart failure) (HCC) 04/09/2022   Pyuria 04/09/2022   CKD (chronic kidney disease) stage 4, GFR 15-29 ml/min (HCC) 04/09/2022   Sepsis (HCC) 12/26/2021   UTI (urinary tract infection) 12/26/2021   Aspiration pneumonia (HCC) 12/26/2021   Acute renal failure superimposed on stage 4 chronic kidney disease (HCC) 12/26/2021   Acute on chronic systolic CHF (congestive heart failure) (HCC) 12/26/2021   Stroke (HCC) 12/25/2021   Pharyngeal dysphagia 07/12/2021   Pressure ulcer of sacral region 07/12/2021   Wound of abdomen 07/12/2021   Closed fracture of distal end of left radius 06/15/2021   Degeneration of lumbar intervertebral disc 04/23/2021   Hyponatremia 11/16/2020   Metabolic acidosis, NAG, failure of bicarbonate regeneration 11/16/2020   Nausea & vomiting 11/16/2020   Acute-on-chronic kidney injury (HCC) 11/16/2020   Abdominal aortic atherosclerosis (HCC) 06/01/2020   Acquired thrombophilia (HCC) 06/01/2020   Insomnia 05/08/2020   Neck pain 01/11/2020   Referred otalgia of both ears 01/11/2020   Frequency of micturition 12/16/2019   Orthostatic hypotension 12/16/2019   Urinary tract infectious disease 12/16/2019   Pneumonia due to coronavirus disease 2019 11/17/2019   Acute combined systolic and diastolic heart failure (HCC) 10/29/2019   COVID-19 10/29/2019   Hyperkalemia 10/16/2019   CAP (community acquired pneumonia) 10/16/2019   Acute metabolic encephalopathy 10/15/2019   Noninfective gastroenteritis and colitis, unspecified 09/01/2019   Hypo-osmolality and hyponatremia 08/04/2019   Spasm 06/03/2019   Seizures (HCC) 01/07/2019   Cerebrovascular accident  (CVA) due to occlusion of left carotid artery (HCC) 01/07/2019   CAD, multiple vessel 01/07/2019   H/O ischemic bowel disease 01/07/2019   Protein calorie malnutrition (HCC) 01/04/2019   Acute bronchitis 12/04/2018   Oral phase dysphagia 12/04/2018   Personal history of transient ischemic attack (TIA), and cerebral infarction without residual deficits 11/11/2018   CKD (chronic kidney disease) stage 3, GFR 30-59 ml/min 11/04/2018   Seizure (HCC) 11/03/2018   Anorectal disorder 06/08/2018   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  Ileostomy 10/18/2017 10/14/2017   Ischemic colitis (HCC) 10/14/2017   Acute respiratory failure with hypoxemia (HCC) 10/14/2017   Acute post-operative pain 10/14/2017   Acute kidney injury superimposed on CKD (HCC) 10/14/2017   Anemia 10/14/2017   Benign paroxysmal positional vertigo 07/29/2017   Nocturia more than twice per night  05/07/2017   Diabetes due to underlying condition w oth circulatory comp (HCC) 05/07/2017   PVD (peripheral vascular disease) (HCC) 05/07/2017   Poor sleep hygiene 05/07/2017   Paradoxical insomnia 05/07/2017   Abnormal feces 04/10/2017   Hilar lymphadenopathy    Hemoptysis 02/19/2017   Pulmonary nodules/lesions, multiple 02/19/2017   Tobacco use 02/19/2017   Disorder of arteries and arterioles, unspecified (HCC) 01/31/2017   Lung field abnormal 01/31/2017   RUQ abdominal pain 01/13/2017   Restless legs syndrome 11/28/2016   Left-sided extracranial carotid artery occlusion 11/26/2016   PAOD (peripheral arterial occlusive disease) (HCC) 11/26/2016   Encounter for routine follow-up after surgery of the  circulatory system 11/26/2016   Abnormal weight loss 01/29/2016   Allergic rhinitis 01/29/2016   Chest pain with moderate risk of acute coronary syndrome 10/27/2015   Other slipping, tripping and stumbling without falling, initial encounter 07/28/2015   Acid indigestion 12/31/2013   Pain in left hip 08/26/2013   Screening for gout 05/13/2013   Disorder of skin or subcutaneous tissue 12/22/2012   Difficult or painful urination 09/02/2012   Peripheral blood vessel disorder (HCC) 05/11/2012   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   Follow-up examination following surgery 05/11/2012   Occlusion and stenosis of carotid artery without mention of cerebral infarction 04/14/2012   Fatigue 09/24/2011   Gonalgia 07/04/2011   Avitaminosis D 05/02/2011   Chronic pancreatitis (HCC) 04/30/2011   Vitamin B12 deficiency 04/30/2011   Intestinal motility disorder 04/30/2011   Fatty liver 04/30/2011   Adiposity 04/30/2011   Other and unspecified general psychiatric examination 04/26/2011   Abdominal pain 04/09/2011   Iron  deficiency anemia, unspecified  04/09/2011   Other general symptoms  04/09/2011   Asthma 04/09/2011   Type 2 diabetes mellitus with diabetic peripheral angiopathy without gangrene (HCC) 01/24/2011   Abnormal weight gain 11/30/2010   Dyslipidemia    Vertigo    Right-sided extracranial carotid artery stenosis    Normal nuclear stress test    Feeling bilious 07/11/2010   Underimmunization status 07/11/2010   Pancreas (digestive gland) works poorly 05/30/2010   B12 deficiency 03/19/2010   DVT 03/16/2010   BLIND LOOP SYNDROME 03/16/2010   Diarrhea 03/16/2010   Chronic depression 02/23/2010   Major depression, single episode 02/23/2010   Cough 11/17/2009   Plantar verruca 11/17/2009   Coronary artery disease involving coronary bypass graft of native heart without angina pectoris 11/16/2009   Claudication (HCC) 11/16/2009   Compulsive tobacco user syndrome  11/16/2009   HLD (hyperlipidemia) 11/16/2009   Nontoxic single thyroid  nodule 11/16/2009   Obstructive apnea 11/16/2009   Radial styloid tenosynovitis 11/16/2009   Low back pain 11/16/2009   Gastroparesis 11/16/2009   LAXATIVE ABUSE 12/17/2007   Accelerated hypertension 12/17/2007   Acid reflux 12/17/2007   HIATAL HERNIA 12/17/2007   IBS 12/17/2007   MELANOSIS COLI 12/17/2007   ARTHRITIS 12/17/2007   HEPATOMEGALY 12/17/2007    ONSET DATE: referred 04/08/24   REFERRING DIAG: Asphyxia  THERAPY DIAG:  No diagnosis found.  Rationale for Evaluation and Treatment: Rehabilitation  SUBJECTIVE:   SUBJECTIVE STATEMENT: *** Pt accompanied by: {accompnied:27141}  PERTINENT HISTORY: Per referring provider PN, MISAO FACKRELL is a very pleasant 78 y.o. year old RH female with a history of recurrent pneumonia with last event requiring intubation from 8/21 through 06/11/2023, history of CVA in 2023 subacute left parietal lobe (1.8 cm) CVA 06/2023 (secondary to hypoperfusion in the setting of sepsis and hypotension in a patient with chronic left carotid occlusion ),  seizure, hypertension, diabetes, history of  remote DVT  on antiplatelet therapy , CAD, prior history of tobacco abuse, iron  deficiency anemia status post infusions, history of seizure disorder on Keppra , GERD, B12 deficiency, OSA on CPAP seen today for evaluation of memory loss. MoCA today is 18/30.  Etiology of her memory loss is unclear, although there is concern for neurodegenerative disorder, and vascular.  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  We discussed the role of speech therapy, and a referral will be placed.   PAIN:  Are you having pain? {OPRCPAIN:27236}  FALLS: Has patient fallen in last 6 months?  {DOEQJOOD:74320}  LIVING ENVIRONMENT: Lives with: {OPRC lives with:25569::lives with their family} Lives in: {Lives in:25570}  PLOF:   Level of assistance: {DOEEONQ:74326} Employment: {SLPemployment:25674}  PATIENT GOALS: ***  OBJECTIVE:  Note: Objective measures were completed at Evaluation unless otherwise noted. OBJECTIVE:   DIAGNOSTIC FINDINGS: ***  INSTRUMENTAL SWALLOW STUDY FINDINGS ({mbss fees:29767}) *** Objective swallow impairments: *** Objective recommended compensations: ***  COGNITION: Overall cognitive status: {cognition:24006} Areas of impairment:  {cognitiveimpairmentslp:27409} Functional deficits: ***  SUBJECTIVE DYSPHAGIA REPORTS:  Date of onset: *** Reported symptoms: {dysphagia symptoms:29766}  Current diet: {slpdiet:27196}  Co-morbid voice changes: {yes/no:20286}  FACTORS WHICH MAY INCREASE RISK OF ADVERSE EVENT IN PRESENCE OF ASPIRATION:  General health: {CSE general health:29764}  Risk factors: {CSE risk factors:29763}    ORAL MOTOR EXAMINATION: Overall status: {OMESLP2:27645} Comments: ***  CLINICAL SWALLOW ASSESSMENT:   Dentition: {CSE dentition:29769} Vocal quality at baseline: {VQL:27192} Patient directly observed with POs: {POobserved:27199} Feeding: {slp feeding:27200} Liquids provided by: {SLPliquids:27201} [facility] Swallow Protocol: {Pass/Fail (Optional):210140017} Oral phase signs and symptoms: {SLPoralphase:27202} Pharyngeal phase signs and symptoms: {SLPpharyngealphase:27203}  PATIENT REPORTED OUTCOME MEASURES (PROM): {SLPPROM:27095}                                                                                                                             TREATMENT DATE: ***   PATIENT EDUCATION: Education details: *** Person educated: {Person educated:25204} Education method: {Education Method:25205} Education comprehension: {Education Comprehension:25206}   ASSESSMENT:  CLINICAL IMPRESSION: Patient is a *** y.o. *** who was seen today for ***.   OBJECTIVE IMPAIRMENTS: include {SLPOBJIMP:27107}. These impairments are limiting patient from  {SLPLIMIT:27108}. Factors affecting potential to achieve goals and functional outcome are {SLP potential:25450}. Patient will benefit from skilled SLP services to address above impairments and improve overall function.  REHAB POTENTIAL: {rehabpotential:25112}   GOALS: Goals reviewed with patient? {yes/no:20286}  SHORT TERM GOALS: Target date: ***  *** Baseline: Goal status: INITIAL  2.  *** Baseline:  Goal status: INITIAL  3.  *** Baseline:  Goal status: INITIAL  4.  *** Baseline:  Goal status: INITIAL  5.  *** Baseline:  Goal status: INITIAL  6.  *** Baseline:  Goal status: INITIAL  LONG TERM GOALS: Target date: ***  *** Baseline:  Goal status: INITIAL  2.  *** Baseline:  Goal status:  INITIAL  3.  *** Baseline:  Goal status: INITIAL  4.  *** Baseline:  Goal status: INITIAL  5.  *** Baseline:  Goal status: INITIAL  6.  *** Baseline:  Goal status: INITIAL  PLAN:  SLP FREQUENCY: {rehab frequency:25116}  SLP DURATION: {rehab duration:25117}  PLANNED INTERVENTIONS: {SLP treatment/interventions:25449}    Harlene LITTIE Ned, CCC-SLP 05/31/2024, 4:56 PM

## 2024-06-01 ENCOUNTER — Ambulatory Visit: Attending: Physician Assistant | Admitting: Speech Pathology

## 2024-06-01 NOTE — Progress Notes (Signed)
 Agree with the assessment and plan as outlined by [facility], FNP-C.  High threshold for endoscopic procedures in this high risk patient.  Patient's anemia may be more driven by CKD.  If FOBT positive and hgb continues to drift, may consider repeat EGD in hospital setting.   Joanna Coad E. Stacia, MD [facility]

## 2024-06-02 DIAGNOSIS — N184 Chronic kidney disease, stage 4 (severe): Secondary | ICD-10-CM | POA: Diagnosis not present

## 2024-06-02 DIAGNOSIS — J189 Pneumonia, unspecified organism: Secondary | ICD-10-CM | POA: Diagnosis not present

## 2024-06-02 DIAGNOSIS — E785 Hyperlipidemia, unspecified: Secondary | ICD-10-CM | POA: Diagnosis not present

## 2024-06-02 DIAGNOSIS — K8681 Exocrine pancreatic insufficiency: Secondary | ICD-10-CM | POA: Diagnosis not present

## 2024-06-02 DIAGNOSIS — R051 Acute cough: Secondary | ICD-10-CM | POA: Diagnosis not present

## 2024-06-02 DIAGNOSIS — I130 Hypertensive heart and chronic kidney disease with heart failure and stage 1 through stage 4 chronic kidney disease, or unspecified chronic kidney disease: Secondary | ICD-10-CM | POA: Diagnosis not present

## 2024-06-02 DIAGNOSIS — R600 Localized edema: Secondary | ICD-10-CM | POA: Diagnosis not present

## 2024-06-02 DIAGNOSIS — J9601 Acute respiratory failure with hypoxia: Secondary | ICD-10-CM | POA: Diagnosis not present

## 2024-06-02 DIAGNOSIS — D649 Anemia, unspecified: Secondary | ICD-10-CM | POA: Diagnosis not present

## 2024-06-02 DIAGNOSIS — I5022 Chronic systolic (congestive) heart failure: Secondary | ICD-10-CM | POA: Diagnosis not present

## 2024-06-02 DIAGNOSIS — E1151 Type 2 diabetes mellitus with diabetic peripheral angiopathy without gangrene: Secondary | ICD-10-CM | POA: Diagnosis not present

## 2024-06-02 DIAGNOSIS — R413 Other amnesia: Secondary | ICD-10-CM | POA: Diagnosis not present

## 2024-06-03 DIAGNOSIS — E1122 Type 2 diabetes mellitus with diabetic chronic kidney disease: Secondary | ICD-10-CM | POA: Diagnosis not present

## 2024-06-03 DIAGNOSIS — A419 Sepsis, unspecified organism: Secondary | ICD-10-CM | POA: Diagnosis not present

## 2024-06-03 DIAGNOSIS — J9601 Acute respiratory failure with hypoxia: Secondary | ICD-10-CM | POA: Diagnosis not present

## 2024-06-03 DIAGNOSIS — J440 Chronic obstructive pulmonary disease with acute lower respiratory infection: Secondary | ICD-10-CM | POA: Diagnosis not present

## 2024-06-03 DIAGNOSIS — J439 Emphysema, unspecified: Secondary | ICD-10-CM | POA: Diagnosis not present

## 2024-06-03 DIAGNOSIS — J188 Other pneumonia, unspecified organism: Secondary | ICD-10-CM | POA: Diagnosis not present

## 2024-06-03 DIAGNOSIS — I5042 Chronic combined systolic (congestive) and diastolic (congestive) heart failure: Secondary | ICD-10-CM | POA: Diagnosis not present

## 2024-06-03 DIAGNOSIS — N184 Chronic kidney disease, stage 4 (severe): Secondary | ICD-10-CM | POA: Diagnosis not present

## 2024-06-03 DIAGNOSIS — I130 Hypertensive heart and chronic kidney disease with heart failure and stage 1 through stage 4 chronic kidney disease, or unspecified chronic kidney disease: Secondary | ICD-10-CM | POA: Diagnosis not present

## 2024-06-10 DIAGNOSIS — A419 Sepsis, unspecified organism: Secondary | ICD-10-CM | POA: Diagnosis not present

## 2024-06-10 DIAGNOSIS — I5042 Chronic combined systolic (congestive) and diastolic (congestive) heart failure: Secondary | ICD-10-CM | POA: Diagnosis not present

## 2024-06-10 DIAGNOSIS — J188 Other pneumonia, unspecified organism: Secondary | ICD-10-CM | POA: Diagnosis not present

## 2024-06-10 DIAGNOSIS — J9601 Acute respiratory failure with hypoxia: Secondary | ICD-10-CM | POA: Diagnosis not present

## 2024-06-10 DIAGNOSIS — N184 Chronic kidney disease, stage 4 (severe): Secondary | ICD-10-CM | POA: Diagnosis not present

## 2024-06-10 DIAGNOSIS — J440 Chronic obstructive pulmonary disease with acute lower respiratory infection: Secondary | ICD-10-CM | POA: Diagnosis not present

## 2024-06-10 DIAGNOSIS — E1122 Type 2 diabetes mellitus with diabetic chronic kidney disease: Secondary | ICD-10-CM | POA: Diagnosis not present

## 2024-06-10 DIAGNOSIS — I130 Hypertensive heart and chronic kidney disease with heart failure and stage 1 through stage 4 chronic kidney disease, or unspecified chronic kidney disease: Secondary | ICD-10-CM | POA: Diagnosis not present

## 2024-06-10 DIAGNOSIS — J439 Emphysema, unspecified: Secondary | ICD-10-CM | POA: Diagnosis not present

## 2024-06-15 DIAGNOSIS — J9601 Acute respiratory failure with hypoxia: Secondary | ICD-10-CM | POA: Diagnosis not present

## 2024-06-15 DIAGNOSIS — J188 Other pneumonia, unspecified organism: Secondary | ICD-10-CM | POA: Diagnosis not present

## 2024-06-15 DIAGNOSIS — A419 Sepsis, unspecified organism: Secondary | ICD-10-CM | POA: Diagnosis not present

## 2024-06-15 DIAGNOSIS — N184 Chronic kidney disease, stage 4 (severe): Secondary | ICD-10-CM | POA: Diagnosis not present

## 2024-06-15 DIAGNOSIS — I5042 Chronic combined systolic (congestive) and diastolic (congestive) heart failure: Secondary | ICD-10-CM | POA: Diagnosis not present

## 2024-06-15 DIAGNOSIS — E1122 Type 2 diabetes mellitus with diabetic chronic kidney disease: Secondary | ICD-10-CM | POA: Diagnosis not present

## 2024-06-15 DIAGNOSIS — I130 Hypertensive heart and chronic kidney disease with heart failure and stage 1 through stage 4 chronic kidney disease, or unspecified chronic kidney disease: Secondary | ICD-10-CM | POA: Diagnosis not present

## 2024-06-15 DIAGNOSIS — J440 Chronic obstructive pulmonary disease with acute lower respiratory infection: Secondary | ICD-10-CM | POA: Diagnosis not present

## 2024-06-15 DIAGNOSIS — J439 Emphysema, unspecified: Secondary | ICD-10-CM | POA: Diagnosis not present

## 2024-06-16 DIAGNOSIS — E1122 Type 2 diabetes mellitus with diabetic chronic kidney disease: Secondary | ICD-10-CM | POA: Diagnosis not present

## 2024-06-16 DIAGNOSIS — J188 Other pneumonia, unspecified organism: Secondary | ICD-10-CM | POA: Diagnosis not present

## 2024-06-16 DIAGNOSIS — A419 Sepsis, unspecified organism: Secondary | ICD-10-CM | POA: Diagnosis not present

## 2024-06-16 DIAGNOSIS — J9601 Acute respiratory failure with hypoxia: Secondary | ICD-10-CM | POA: Diagnosis not present

## 2024-06-16 DIAGNOSIS — I5042 Chronic combined systolic (congestive) and diastolic (congestive) heart failure: Secondary | ICD-10-CM | POA: Diagnosis not present

## 2024-06-16 DIAGNOSIS — J440 Chronic obstructive pulmonary disease with acute lower respiratory infection: Secondary | ICD-10-CM | POA: Diagnosis not present

## 2024-06-16 DIAGNOSIS — J439 Emphysema, unspecified: Secondary | ICD-10-CM | POA: Diagnosis not present

## 2024-06-16 DIAGNOSIS — N184 Chronic kidney disease, stage 4 (severe): Secondary | ICD-10-CM | POA: Diagnosis not present

## 2024-06-16 DIAGNOSIS — I130 Hypertensive heart and chronic kidney disease with heart failure and stage 1 through stage 4 chronic kidney disease, or unspecified chronic kidney disease: Secondary | ICD-10-CM | POA: Diagnosis not present

## 2024-06-17 DIAGNOSIS — J440 Chronic obstructive pulmonary disease with acute lower respiratory infection: Secondary | ICD-10-CM | POA: Diagnosis not present

## 2024-06-17 DIAGNOSIS — I5042 Chronic combined systolic (congestive) and diastolic (congestive) heart failure: Secondary | ICD-10-CM | POA: Diagnosis not present

## 2024-06-17 DIAGNOSIS — J439 Emphysema, unspecified: Secondary | ICD-10-CM | POA: Diagnosis not present

## 2024-06-17 DIAGNOSIS — J188 Other pneumonia, unspecified organism: Secondary | ICD-10-CM | POA: Diagnosis not present

## 2024-06-17 DIAGNOSIS — E1122 Type 2 diabetes mellitus with diabetic chronic kidney disease: Secondary | ICD-10-CM | POA: Diagnosis not present

## 2024-06-17 DIAGNOSIS — A419 Sepsis, unspecified organism: Secondary | ICD-10-CM | POA: Diagnosis not present

## 2024-06-17 DIAGNOSIS — J9601 Acute respiratory failure with hypoxia: Secondary | ICD-10-CM | POA: Diagnosis not present

## 2024-06-17 DIAGNOSIS — I130 Hypertensive heart and chronic kidney disease with heart failure and stage 1 through stage 4 chronic kidney disease, or unspecified chronic kidney disease: Secondary | ICD-10-CM | POA: Diagnosis not present

## 2024-06-17 DIAGNOSIS — N184 Chronic kidney disease, stage 4 (severe): Secondary | ICD-10-CM | POA: Diagnosis not present

## 2024-06-17 NOTE — Progress Notes (Signed)
 [name], female    DOB: Jun 18, 1946   MRN: [id number]   Brief patient profile:  78  yowf  quit smoking 2010  referred to pulmonary clinic 06/18/2024 for post hosp f/u    Seen by PCCM in 05/2023 cap/ resp failure   Admit date: 05/22/2024 Discharge date: 05/28/2024   Admitted From: Home Disposition: Home   Recommendations for Outpatient Follow-up:  Follow up with PCP in 1-2 weeks Please obtain BMP/CBC in one week your next doctors visit.  Augmentin  and p.o. doxycycline  prescribed 3 L of nasal cannula at home Outpatient repeat chest x-ray in 6-8 weeks   Discharge Condition: Stable CODE STATUS: Full Diet recommendation: Cardiac   Brief/Interim Summary: Brief Narrative:  78 year old with history of HTN, CHF, COPD/asthma, chronic pancreatitis, DM 2, depression, diabetes with neuropathy, PVD recently diagnosed with pneumonia a week ago and completed cefdinir.  Continued to have shortness of breath therefore admitted to the hospital.  Chest x-ray showed concerns of multifocal pneumonia therefore started on broad-spectrum antibiotics.  Also required diuretics during the hospitalization due to some signs of volume overload.  VQ scan was negative for PE and lower extremity Dopplers were negative for DVT.  Hospital course was complicated by AKI but improved with hydration. Eventually patient's respiratory status started improving.  With ambulation still required 3 L nasal cannula therefore this was arranged with home health services.  Patient today is medically stable to be discharged on p.o. antibiotics and follow-up outpatient with PCP.     Assessment & Plan:  Principal Problem:   Multifocal pneumonia Active Problems:   CKD (chronic kidney disease) stage 4, GFR 15-29 ml/min (HCC)   Chronic combined systolic and diastolic CHF (congestive heart failure) (HCC)   Dyslipidemia   Chronic depression   Iron  deficiency anemia, unspecified    Asthma   Chronic pancreatitis (HCC)   Type 2  diabetes mellitus with diabetic peripheral angiopathy without gangrene (HCC)   PVD (peripheral vascular disease) (HCC)   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HCC)      Sepsis POA Multifocal pneumonia Acute hypoxic respiratory failure:  Recently completed antibiotics and failed outpatient treatment (  cefdinir) admitted with sepsis/multifocal pneumonia -remains hemodynamically stable leukocytosis resolved. Blood culture NGTD.  Urine antigen strep negative. MRSA PCR negative.  Leukocytosis have resolved. -Will transition patient to p.o. Augmentin  and doxycycline  to finish the course. -Ambulatory pulse ox showing hypoxia down to 78% on  RA, needing 3L [place]   Hypomagnesemia - Repleted prior to discharge   D-dimer positive: VQ scan negative, bilateral lower extremity Dopplers negative for DVT   AKI on CKD stage 4 Metabolic acidosis: Creatinine peaked around 2.4, now stabilizing around baseline of 1.7   COPD Asthma: No wheezing, continue inhalers, Singulair .   Peripheral vascular disease Carotid artery disease Cont Plavix  and statin   Hypertension Chronic diastolic CHF, EF 44% Nonsustained V. tach Leg swelling worse from baseline Hypomagnesemia: Echo updated 8/20 EF normal 55-60, G1 DD RV size normal, MV-degenerative, AV not well-visualized.  Continue home Toprol , Lasix    Chronic knee pain and ambulatory dysfunction On Chronic norco: Continue hydrocodone  at lower dose. She chronically takes it As per daughter in law/son,  she is mostly bedbound by her choice but she is ambulatory  Cont PT OT    Chronic diarrhea Chronic pancreatitis: Cont lomotil  prn   Dyslipidemia cont Lipitor.   Chronic depression: Continue home doxepin , Remeron , Effexor    Chronic Rash/eczema: Appears generalized on extremities and back with excoriation, going on for 3  months. 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 off isolation.  Previous provider discussed with infectious disease   Seizure disorder: continue home Keppra    IDA/anemia of CKD: Hemoglobin around baseline 10.0   T2DM with diabetic peripheral angiopathy without gangrene: A1c 6.1.  Sliding scale and Accu-Cheks   History of Present Illness  06/18/2024  Pulmonary/ 1st office eval/[name]  Chief Complaint  Patient presents with   Consult    Pna   Dyspnea:  very  sedentary  / can't do mb and back flat s stopping on the way back to [place] due to R knee and breathing Cough: some every day but no pattern / sometimes brown but not bloody lateley on plavix  and asa  Sleep: flat on couch  SABA use: has one not using afraid of getting dep  02 use:has refillable system  3 lpm home conc refuses to wear  portable 02 or do monitor    No obvious day to day or daytime pattern/variability or assoc excess/ purulent sputum or mucus plugs or hemoptysis or cp or chest tightness, subjective wheeze or overt sinus or hb symptoms.    Also denies any obvious fluctuation of symptoms with weather or environmental changes or other aggravating or alleviating factors except as outlined above   No unusual exposure hx or h/o childhood pna/ asthma or knowledge of premature birth.  Current Allergies, Complete Past Medical History, Past Surgical History, Family History, and Social History were reviewed in [employer] record.  ROS  The following are not active complaints unless bolded Hoarseness, sore throat, dysphagia, dental problems, itching, sneezing,  nasal congestion or discharge of excess mucus or purulent secretions, ear ache,   fever, chills, sweats, unintended wt loss or wt gain, classically pleuritic or exertional cp,  orthopnea pnd or arm/hand swelling  or leg swelling, presyncope, palpitations, abdominal pain, anorexia, nausea, vomiting, diarrhea  or change in bowel habits or change in bladder habits,  change in stools or change in urine, dysuria, hematuria,  rash, arthralgias, visual complaints, headache, numbness, weakness or ataxia or problems with walking or coordination,  change in mood or  memory.             Outpatient Medications Prior to Visit  Medication Sig Dispense Refill   albuterol  (VENTOLIN  HFA) 108 (90 Base) MCG/ACT inhaler Inhale 1-2 puffs into the lungs every 6 (six) hours as needed for wheezing or shortness of breath.     amLODipine  (NORVASC ) 10 MG tablet Take 1 tablet (10 mg total) by mouth daily. 30 tablet 0   atorvastatin  (LIPITOR) 40 MG tablet Take 1 tablet (40 mg total) by mouth daily. 30 tablet 0   cefdinir (OMNICEF) 300 MG capsule one capsule with food Orally twice a day; Duration: 10 days     clopidogrel  (PLAVIX ) 75 MG tablet Take 1 tablet (75 mg total) by mouth daily. 30 tablet 0   diphenoxylate -atropine  (LOMOTIL ) 2.5-0.025 MG tablet Take 1 tablet by mouth 4 (four) times daily as needed.     doxepin  (SINEQUAN ) 10 MG/ML solution Take 0.3 mLs by mouth at bedtime.     doxycycline  (VIBRA -TABS) 100 MG tablet 1 tablet Orally twice a day; Duration: 10 days     feeding supplement (ENSURE ENLIVE / ENSURE PLUS) LIQD Take 237 mLs by mouth 2 (two) times daily between meals.     ferrous gluconate  (FERGON) 324 MG  tablet Take 1 tablet (324 mg total) by mouth daily with breakfast. 30 tablet 2   fluticasone  (FLONASE ) 50 MCG/ACT nasal spray Place 2 sprays into both nostrils daily as needed for allergies.     furosemide  (LASIX ) 20 MG tablet Take 1 tablet (20 mg total) by mouth daily as needed. 30 tablet 0   HYDROcodone -acetaminophen  (NORCO) 10-325 MG tablet Take 1 tablet by mouth 4 (four) times daily as needed.     iron  polysaccharides (NIFEREX) 150 MG capsule Take 150 mg by mouth daily.     levETIRAcetam  (KEPPRA ) 500 MG tablet TAKE 1 TABLET BY MOUTH TWICE A DAY 180 tablet 2   meclizine  (ANTIVERT ) 25 MG tablet Take 0.5 tablets (12.5 mg total) by mouth 3 (three) times daily as needed  for dizziness. 30 tablet 0   metoprolol  succinate (TOPROL -XL) 100 MG 24 hr tablet Take 100 mg by mouth daily.     mirtazapine  (REMERON ) 15 MG tablet Take 15 mg by mouth at bedtime.     montelukast  (SINGULAIR ) 10 MG tablet Take 10 mg by mouth daily as needed.     mupirocin  ointment (BACTROBAN ) 2 % Apply 1 Application topically 2 (two) times daily.     nitroGLYCERIN  (NITROSTAT ) 0.4 MG SL tablet Place 1 tablet (0.4 mg total) under the tongue every 5 (five) minutes as needed for chest pain. 90 tablet 3   nystatin-triamcinolone  (MYCOLOG II) cream Apply 1 Application topically 2 (two) times daily.     ondansetron  (ZOFRAN ) 4 MG tablet Take 1 tablet (4 mg total) by mouth every 6 (six) hours as needed for nausea or vomiting. 40 tablet 1   pantoprazole  (PROTONIX ) 40 MG tablet Take 1 tablet (40 mg total) by mouth 2 (two) times daily. 180 tablet 2   venlafaxine  XR (EFFEXOR -XR) 75 MG 24 hr capsule Take 1 capsule (75 mg total) by mouth daily.     vitamin B-12 (CYANOCOBALAMIN ) 500 MCG tablet Take 500 mcg by mouth daily.     Vitamin D , Ergocalciferol , (DRISDOL ) 1.25 MG (50000 UT) CAPS capsule Take 50,000 Units by mouth every Wednesday.      No facility-administered medications prior to visit.    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 known 60-70% mid-LAD stenosis with caths in 1999, 2002, 2006, and 2012 showing stable anatomy b. 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HCC)    Diabetes mellitus    Diverticulosis    Dizziness    chronic   Dyslipidemia    Fatty liver    GERD (gastroesophageal reflux disease)    hiatial hernia   Heart murmur    Hiatal hernia    Hypertension    Irritable bowel syndrome    Pancreatitis, chronic (HCC)    Peripheral vascular disease (HCC) 02/20/2010   s/p stent of left SFA; carotid  stenosis   Seizure (HCC) 11/04/2018   last 1 yr 2020 per pt unsure month    Sleep apnea    no cpap    Stroke (HCC)    Thyroid  nodule    Vitamin B12 deficiency       Objective:     BP 139/83   Pulse 87   Temp 98.6 F (37 C) (Oral)  Ht 5' 3 (1.6 m)   Wt 162 lb (73.5 kg)   SpO2 94%   BMI 28.70 kg/m   SpO2: 94 % RA  Amb wf / responses a bit slow but no increased wob or spont cough    HEENT : Oropharynx  clear      Nasal turbinates nl    NECK :  without  apparent JVD/ palpable Nodes/TM    LUNGS: no acc muscle use,  Nl contour chest with distant BS bilaterally without cough on insp or exp maneuvers   CV:  RRR  no s3 or murmur or increase in P2, and 1+ pitting both LEs  ABD:  obese soft and nontender   MS:  Gait nl   ext warm without deformities Or obvious joint restrictions  calf tenderness, cyanosis or clubbing    SKIN: warm and dry with mild acute appearing venous stasis dermatitis changes with several excoriations/ scratch marks.  NEURO:  alert, approp, nl sensorium with  no motor or cerebellar deficits apparent.      CXR PA and Lateral:   06/18/2024 :    I personally reviewed images and impression is as follows: R ML as dz much improved vs priors    / restrictive changes due to severe kyphosis    Labs ordered/ reviewed:      Chemistry      Component Value Date/Time   NA 136 06/18/2024 1144   NA 141 02/18/2023 1449   K 4.1 06/18/2024 1144   CL 103 06/18/2024 1144   CO2 22 06/18/2024 1144   BUN 18 06/18/2024 1144   BUN 16 02/18/2023 1449   CREATININE 1.66 (H) 06/18/2024 1144      Component Value Date/Time   CALCIUM  7.5 (L) 06/18/2024 1144   ALKPHOS 105 05/22/2024 1604   AST 36 05/22/2024 1604   ALT 15 05/22/2024 1604   BILITOT 1.0 05/22/2024 1604        Lab Results  Component Value Date   WBC 15.3 (H) 06/18/2024   HGB 10.4 (L) 06/18/2024   HCT 32.9 (L) 06/18/2024   MCV 87.3 06/18/2024   PLT 269.0 06/18/2024        EOS                                                              0.5                                  06/18/2024      Lab Results  Component Value Date   TSH 3.96 04/08/2024     Lab Results  Component Value Date   PROBNP 244.0 (H) 06/18/2024       Lab Results  Component Value Date   ESRSEDRATE 78 (H) 06/18/2024   ESRSEDRATE 30 03/31/2023   ESRSEDRATE 32 (H) 04/09/2011        Assessment   Assessment & Plan Asthmatic bronchitis without complication, unspecified asthma severity, unspecified whether persistent - ok to use saba prn: Re SABA :  I spent extra time with pt today reviewing appropriate use of albuterol  for prn use on exertion with the following points: 1) saba is for relief of sob that does not improve by walking  a slower pace or resting but rather if the pt does not improve after trying this first. 2) If the pt is convinced, as many are, that saba helps recover from activity faster then it's easy to tell if this is the case by re-challenging : ie stop, take the inhaler, then p 5 minutes try the exact same activity (intensity of workload) that just caused the symptoms and see if they are substantially diminished or not after saba 3) if there is an activity that reproducibly causes the symptoms, try the saba 15 min before the activity on alternate days   If in fact the saba really does help, then fine to continue to use it prn but advised may need to look closer at the maintenance regimen (nothing for now) being used to achieve better control of airways disease with exertion.   DOE (dyspnea on exertion) Quit smoking 2010 - PFT's  8/28//18  FEV1 1.73 (86 % ) ratio 0.83  p 0 % improvement from saba p 0 prior to study with DLCO  12.4 (61% %)   and FV curve nl  and ERV 6%  at wt 178   - Echo 05/23/24 Grade 1 diastolic dysfunction with nl LA/RA - VQ lung scan 05/23/24 neg for PE -06/18/2024  at wt 162  Walked on 3lpm  cont  x  3  lap(s) =  approx 750  ft  @ slow pace s desats  but could not maintain on POC  @ 4lpm   S/p CAP / acute resp failure s evidence of COPD or signifcant PF (but could have organizing pna as suggested by ESR elevation )  and hope to see this  gradually improve without further interventions (eg OCS)  but it's happending of a setting of severe restrictive changes on cxr due to kyphosis so has very little ventilatory reserve so next step may be short course prednisone   (Prednisone  10 mg take  4 each am x 2 days,   2 each am x 2 days,  1 each am x 2 days and stop )    Chronic respiratory failure with hypoxia (HCC) 02 dep at 3lpm hs and with walking as of 06/18/2024    >>> rec add humidity to stationery concentrator at home  >>> strongly advised:goal of 02 rx is to keep sata > 90% at all times -  see avs for instructions unique to this ov   Discussed in detail all the  indications, usual  risks and alternatives  relative to the benefits with patient who agrees to proceed with Rx as outlined.      Each maintenance medication was reviewed in detail including emphasizing most importantly the difference between maintenance and prns and under what circumstances the prns are to be triggered using an action plan format where appropriate.  Total time for H and P, chart review, counseling, reviewing 02/ pulse ox  device(s) , directly observing portions of ambulatory 02 saturation study/ and generating customized AVS unique to this office visit / same day charting =45 min post hosp ov with pt new to me                 AVS  Patient Instructions  Make sure you check your oxygen saturation  AT  your highest level of activity (not after you stop)   to be sure it stays over 90% and adjust  02 flow upward to maintain this level if needed but remember to turn it back to previous settings  when you stop (to conserve your supply).   For cough/ congestion > mucinex  or mucinex  dm  up to maximum of  1200 mg every 12 hours as needed   We will ask DME to supply you with humidity for your  concentrator   We walk you today to see if you qualify for a Portable concentrator   Please remember to go to the  x-ray department  for your tests - we will call you with the results when they are available    Please remember to go to the lab department   for your tests - we will call you with the results when they are available.      Please schedule a follow up office visit in 8 weeks, sooner if needed  with all medications /inhalers/ solutions in hand so we can verify exactly what you are taking. This includes all medications from all doctors and over the counters      Ozell America, MD 06/18/2024

## 2024-06-18 ENCOUNTER — Ambulatory Visit (INDEPENDENT_AMBULATORY_CARE_PROVIDER_SITE_OTHER): Admitting: Internal Medicine

## 2024-06-18 ENCOUNTER — Encounter: Payer: Self-pay | Admitting: Internal Medicine

## 2024-06-18 ENCOUNTER — Ambulatory Visit

## 2024-06-18 VITALS — BP 139/83 | HR 87 | Temp 98.6°F | Ht 63.0 in | Wt 162.0 lb

## 2024-06-18 DIAGNOSIS — R0609 Other forms of dyspnea: Secondary | ICD-10-CM | POA: Diagnosis not present

## 2024-06-18 DIAGNOSIS — R918 Other nonspecific abnormal finding of lung field: Secondary | ICD-10-CM | POA: Diagnosis not present

## 2024-06-18 DIAGNOSIS — J9611 Chronic respiratory failure with hypoxia: Secondary | ICD-10-CM

## 2024-06-18 DIAGNOSIS — R059 Cough, unspecified: Secondary | ICD-10-CM | POA: Diagnosis not present

## 2024-06-18 DIAGNOSIS — J45909 Unspecified asthma, uncomplicated: Secondary | ICD-10-CM

## 2024-06-18 DIAGNOSIS — I700 Atherosclerosis of aorta: Secondary | ICD-10-CM | POA: Diagnosis not present

## 2024-06-18 LAB — CBC WITH DIFFERENTIAL/PLATELET
Basophils Absolute: 0.1 K/uL (ref 0.0–0.1)
Basophils Relative: 0.5 % (ref 0.0–3.0)
Eosinophils Absolute: 0.5 K/uL (ref 0.0–0.7)
Eosinophils Relative: 3.6 % (ref 0.0–5.0)
HCT: 32.9 % — ABNORMAL LOW (ref 36.0–46.0)
Hemoglobin: 10.4 g/dL — ABNORMAL LOW (ref 12.0–15.0)
Lymphocytes Relative: 15.3 % (ref 12.0–46.0)
Lymphs Abs: 2.3 K/uL (ref 0.7–4.0)
MCHC: 31.7 g/dL (ref 30.0–36.0)
MCV: 87.3 fl (ref 78.0–100.0)
Monocytes Absolute: 1.1 K/uL — ABNORMAL HIGH (ref 0.1–1.0)
Monocytes Relative: 7.2 % (ref 3.0–12.0)
Neutro Abs: 11.2 K/uL — ABNORMAL HIGH (ref 1.4–7.7)
Neutrophils Relative %: 73.4 % (ref 43.0–77.0)
Platelets: 269 K/uL (ref 150.0–400.0)
RBC: 3.77 Mil/uL — ABNORMAL LOW (ref 3.87–5.11)
RDW: 17.3 % — ABNORMAL HIGH (ref 11.5–15.5)
WBC: 15.3 K/uL — ABNORMAL HIGH (ref 4.0–10.5)

## 2024-06-18 LAB — BASIC METABOLIC PANEL WITH GFR
BUN: 18 mg/dL (ref 6–23)
CO2: 22 meq/L (ref 19–32)
Calcium: 7.5 mg/dL — ABNORMAL LOW (ref 8.4–10.5)
Chloride: 103 meq/L (ref 96–112)
Creatinine, Ser: 1.66 mg/dL — ABNORMAL HIGH (ref 0.40–1.20)
GFR: 29.68 mL/min — ABNORMAL LOW (ref >60.00)
Glucose, Bld: 107 mg/dL — ABNORMAL HIGH (ref 70–99)
Potassium: 4.1 meq/L (ref 3.5–5.1)
Sodium: 136 meq/L (ref 135–145)

## 2024-06-18 LAB — BRAIN NATRIURETIC PEPTIDE: Pro B Natriuretic peptide (BNP): 244 pg/mL — ABNORMAL HIGH (ref 0.0–100.0)

## 2024-06-18 LAB — SEDIMENTATION RATE: Sed Rate: 78 mm/h — ABNORMAL HIGH (ref 0–30)

## 2024-06-18 NOTE — Assessment & Plan Note (Addendum)
 Quit smoking 2010 - PFT's  8/28//18  FEV1 1.73 (86 % ) ratio 0.83  p 0 % improvement from saba p 0 prior to study with DLCO  12.4 (61% %)   and FV curve nl  and ERV 6%  at wt 178   - Echo 05/23/24 Grade 1 diastolic dysfunction with nl LA/RA - VQ lung scan 05/23/24 neg for PE -06/18/2024  at wt 162  Walked on 3lpm  cont  x  3  lap(s) =  approx 750  ft  @ slow pace s desats  but could not maintain on POC @ 4lpm   S/p CAP / acute resp failure s evidence of COPD or signifcant PF (but could have organizing pna as suggested by ESR elevation )  and hope to see this  gradually improve without further interventions (eg OCS)  but it's happending of a setting of severe restrictive changes on cxr due to kyphosis so has very little ventilatory reserve so next step may be short course prednisone   (Prednisone  10 mg take  4 each am x 2 days,   2 each am x 2 days,  1 each am x 2 days and stop )

## 2024-06-18 NOTE — Patient Instructions (Addendum)
 Make sure you check your oxygen saturation  AT  your highest level of activity (not after you stop)   to be sure it stays over 90% and adjust  02 flow upward to maintain this level if needed but remember to turn it back to previous settings when you stop (to conserve your supply).   For cough/ congestion > mucinex  or mucinex  dm  up to maximum of  1200 mg every 12 hours as needed   We will ask DME to supply you with humidity for your concentrator   We walk you today to see if you qualify for a Portable concentrator   Please remember to go to the  x-ray department  for your tests - we will call you with the results when they are available    Please remember to go to the lab department   for your tests - we will call you with the results when they are available.      Please schedule a follow up office visit in 8 weeks, sooner if needed  with all medications /inhalers/ solutions in hand so we can verify exactly what you are taking. This includes all medications from all doctors and over the counters

## 2024-06-18 NOTE — Assessment & Plan Note (Addendum)
 02 dep at 3lpm hs and with walking as of 06/18/2024    >>> rec add humidity to stationery concentrator at home  >>> strongly advised:goal of 02 rx is to keep sata > 90% at all times -  see avs for instructions unique to this ov   Discussed in detail all the  indications, usual  risks and alternatives  relative to the benefits with patient who agrees to proceed with Rx as outlined.      Each maintenance medication was reviewed in detail including emphasizing most importantly the difference between maintenance and prns and under what circumstances the prns are to be triggered using an action plan format where appropriate.  Total time for H and P, chart review, counseling, reviewing 02/ pulse ox  device(s) , directly observing portions of ambulatory 02 saturation study/ and generating customized AVS unique to this office visit / same day charting =45 min post hosp ov with pt new to me

## 2024-06-19 ENCOUNTER — Ambulatory Visit: Payer: Self-pay | Admitting: Internal Medicine

## 2024-06-21 NOTE — Progress Notes (Signed)
 Pt.notified

## 2024-06-23 LAB — QUANTIFERON-TB GOLD PLUS
Mitogen-NIL: >10 [IU]/mL
NIL: 0.04 [IU]/mL
QuantiFERON-TB Gold Plus: NEGATIVE
TB1-NIL: <0 [IU]/mL
TB2-NIL: <0 [IU]/mL

## 2024-06-28 NOTE — Progress Notes (Signed)
 Atc x1 *call cannot be completed*

## 2024-07-01 DIAGNOSIS — E1122 Type 2 diabetes mellitus with diabetic chronic kidney disease: Secondary | ICD-10-CM | POA: Diagnosis not present

## 2024-07-01 DIAGNOSIS — N184 Chronic kidney disease, stage 4 (severe): Secondary | ICD-10-CM | POA: Diagnosis not present

## 2024-07-01 DIAGNOSIS — J440 Chronic obstructive pulmonary disease with acute lower respiratory infection: Secondary | ICD-10-CM | POA: Diagnosis not present

## 2024-07-01 DIAGNOSIS — J439 Emphysema, unspecified: Secondary | ICD-10-CM | POA: Diagnosis not present

## 2024-07-01 DIAGNOSIS — I130 Hypertensive heart and chronic kidney disease with heart failure and stage 1 through stage 4 chronic kidney disease, or unspecified chronic kidney disease: Secondary | ICD-10-CM | POA: Diagnosis not present

## 2024-07-01 DIAGNOSIS — A419 Sepsis, unspecified organism: Secondary | ICD-10-CM | POA: Diagnosis not present

## 2024-07-01 DIAGNOSIS — J188 Other pneumonia, unspecified organism: Secondary | ICD-10-CM | POA: Diagnosis not present

## 2024-07-01 DIAGNOSIS — I5042 Chronic combined systolic (congestive) and diastolic (congestive) heart failure: Secondary | ICD-10-CM | POA: Diagnosis not present

## 2024-07-01 DIAGNOSIS — J9601 Acute respiratory failure with hypoxia: Secondary | ICD-10-CM | POA: Diagnosis not present

## 2024-07-01 NOTE — Progress Notes (Signed)
 Atc x2 phone continued to ring - sending letter

## 2024-07-02 DIAGNOSIS — J188 Other pneumonia, unspecified organism: Secondary | ICD-10-CM | POA: Diagnosis not present

## 2024-07-02 DIAGNOSIS — E1122 Type 2 diabetes mellitus with diabetic chronic kidney disease: Secondary | ICD-10-CM | POA: Diagnosis not present

## 2024-07-02 DIAGNOSIS — I130 Hypertensive heart and chronic kidney disease with heart failure and stage 1 through stage 4 chronic kidney disease, or unspecified chronic kidney disease: Secondary | ICD-10-CM | POA: Diagnosis not present

## 2024-07-02 DIAGNOSIS — N184 Chronic kidney disease, stage 4 (severe): Secondary | ICD-10-CM | POA: Diagnosis not present

## 2024-07-02 DIAGNOSIS — J440 Chronic obstructive pulmonary disease with acute lower respiratory infection: Secondary | ICD-10-CM | POA: Diagnosis not present

## 2024-07-02 DIAGNOSIS — I5042 Chronic combined systolic (congestive) and diastolic (congestive) heart failure: Secondary | ICD-10-CM | POA: Diagnosis not present

## 2024-07-02 DIAGNOSIS — J9601 Acute respiratory failure with hypoxia: Secondary | ICD-10-CM | POA: Diagnosis not present

## 2024-07-02 DIAGNOSIS — J439 Emphysema, unspecified: Secondary | ICD-10-CM | POA: Diagnosis not present

## 2024-07-02 DIAGNOSIS — A419 Sepsis, unspecified organism: Secondary | ICD-10-CM | POA: Diagnosis not present

## 2024-07-04 DIAGNOSIS — I5042 Chronic combined systolic (congestive) and diastolic (congestive) heart failure: Secondary | ICD-10-CM | POA: Diagnosis not present

## 2024-07-04 DIAGNOSIS — N184 Chronic kidney disease, stage 4 (severe): Secondary | ICD-10-CM | POA: Diagnosis not present

## 2024-07-04 DIAGNOSIS — J9601 Acute respiratory failure with hypoxia: Secondary | ICD-10-CM | POA: Diagnosis not present

## 2024-07-04 DIAGNOSIS — J439 Emphysema, unspecified: Secondary | ICD-10-CM | POA: Diagnosis not present

## 2024-07-04 DIAGNOSIS — J188 Other pneumonia, unspecified organism: Secondary | ICD-10-CM | POA: Diagnosis not present

## 2024-07-04 DIAGNOSIS — A419 Sepsis, unspecified organism: Secondary | ICD-10-CM | POA: Diagnosis not present

## 2024-07-04 DIAGNOSIS — J440 Chronic obstructive pulmonary disease with acute lower respiratory infection: Secondary | ICD-10-CM | POA: Diagnosis not present

## 2024-07-04 DIAGNOSIS — E1122 Type 2 diabetes mellitus with diabetic chronic kidney disease: Secondary | ICD-10-CM | POA: Diagnosis not present

## 2024-07-05 ENCOUNTER — Ambulatory Visit

## 2024-07-06 DIAGNOSIS — J440 Chronic obstructive pulmonary disease with acute lower respiratory infection: Secondary | ICD-10-CM | POA: Diagnosis not present

## 2024-07-06 DIAGNOSIS — J188 Other pneumonia, unspecified organism: Secondary | ICD-10-CM | POA: Diagnosis not present

## 2024-07-06 DIAGNOSIS — E1122 Type 2 diabetes mellitus with diabetic chronic kidney disease: Secondary | ICD-10-CM | POA: Diagnosis not present

## 2024-07-06 DIAGNOSIS — A419 Sepsis, unspecified organism: Secondary | ICD-10-CM | POA: Diagnosis not present

## 2024-07-06 DIAGNOSIS — J9601 Acute respiratory failure with hypoxia: Secondary | ICD-10-CM | POA: Diagnosis not present

## 2024-07-06 DIAGNOSIS — J439 Emphysema, unspecified: Secondary | ICD-10-CM | POA: Diagnosis not present

## 2024-07-06 DIAGNOSIS — I130 Hypertensive heart and chronic kidney disease with heart failure and stage 1 through stage 4 chronic kidney disease, or unspecified chronic kidney disease: Secondary | ICD-10-CM | POA: Diagnosis not present

## 2024-07-06 DIAGNOSIS — I5042 Chronic combined systolic (congestive) and diastolic (congestive) heart failure: Secondary | ICD-10-CM | POA: Diagnosis not present

## 2024-07-06 DIAGNOSIS — N184 Chronic kidney disease, stage 4 (severe): Secondary | ICD-10-CM | POA: Diagnosis not present

## 2024-07-10 DIAGNOSIS — I130 Hypertensive heart and chronic kidney disease with heart failure and stage 1 through stage 4 chronic kidney disease, or unspecified chronic kidney disease: Secondary | ICD-10-CM | POA: Diagnosis not present

## 2024-07-14 DIAGNOSIS — E1122 Type 2 diabetes mellitus with diabetic chronic kidney disease: Secondary | ICD-10-CM | POA: Diagnosis not present

## 2024-07-14 DIAGNOSIS — I5042 Chronic combined systolic (congestive) and diastolic (congestive) heart failure: Secondary | ICD-10-CM | POA: Diagnosis not present

## 2024-07-14 DIAGNOSIS — N184 Chronic kidney disease, stage 4 (severe): Secondary | ICD-10-CM | POA: Diagnosis not present

## 2024-07-14 DIAGNOSIS — J188 Other pneumonia, unspecified organism: Secondary | ICD-10-CM | POA: Diagnosis not present

## 2024-07-14 DIAGNOSIS — J9601 Acute respiratory failure with hypoxia: Secondary | ICD-10-CM | POA: Diagnosis not present

## 2024-07-14 DIAGNOSIS — J440 Chronic obstructive pulmonary disease with acute lower respiratory infection: Secondary | ICD-10-CM | POA: Diagnosis not present

## 2024-07-14 DIAGNOSIS — I130 Hypertensive heart and chronic kidney disease with heart failure and stage 1 through stage 4 chronic kidney disease, or unspecified chronic kidney disease: Secondary | ICD-10-CM | POA: Diagnosis not present

## 2024-07-14 DIAGNOSIS — J439 Emphysema, unspecified: Secondary | ICD-10-CM | POA: Diagnosis not present

## 2024-07-14 DIAGNOSIS — A419 Sepsis, unspecified organism: Secondary | ICD-10-CM | POA: Diagnosis not present

## 2024-07-15 DIAGNOSIS — H04123 Dry eye syndrome of bilateral lacrimal glands: Secondary | ICD-10-CM | POA: Diagnosis not present

## 2024-07-15 DIAGNOSIS — H43393 Other vitreous opacities, bilateral: Secondary | ICD-10-CM | POA: Diagnosis not present

## 2024-07-15 DIAGNOSIS — Z961 Presence of intraocular lens: Secondary | ICD-10-CM | POA: Diagnosis not present

## 2024-07-15 DIAGNOSIS — Z0100 Encounter for examination of eyes and vision without abnormal findings: Secondary | ICD-10-CM | POA: Diagnosis not present

## 2024-07-19 DIAGNOSIS — Z5181 Encounter for therapeutic drug level monitoring: Secondary | ICD-10-CM | POA: Diagnosis not present

## 2024-07-19 DIAGNOSIS — M47896 Other spondylosis, lumbar region: Secondary | ICD-10-CM | POA: Diagnosis not present

## 2024-07-19 DIAGNOSIS — M170 Bilateral primary osteoarthritis of knee: Secondary | ICD-10-CM | POA: Diagnosis not present

## 2024-07-20 ENCOUNTER — Ambulatory Visit

## 2024-07-20 ENCOUNTER — Ambulatory Visit: Admitting: Physician Assistant

## 2024-07-21 DIAGNOSIS — J439 Emphysema, unspecified: Secondary | ICD-10-CM | POA: Diagnosis not present

## 2024-07-21 DIAGNOSIS — R238 Other skin changes: Secondary | ICD-10-CM | POA: Diagnosis not present

## 2024-07-21 DIAGNOSIS — J9601 Acute respiratory failure with hypoxia: Secondary | ICD-10-CM | POA: Diagnosis not present

## 2024-07-21 DIAGNOSIS — I5042 Chronic combined systolic (congestive) and diastolic (congestive) heart failure: Secondary | ICD-10-CM | POA: Diagnosis not present

## 2024-07-21 DIAGNOSIS — N184 Chronic kidney disease, stage 4 (severe): Secondary | ICD-10-CM | POA: Diagnosis not present

## 2024-07-21 DIAGNOSIS — L299 Pruritus, unspecified: Secondary | ICD-10-CM | POA: Diagnosis not present

## 2024-07-21 DIAGNOSIS — J440 Chronic obstructive pulmonary disease with acute lower respiratory infection: Secondary | ICD-10-CM | POA: Diagnosis not present

## 2024-07-21 DIAGNOSIS — A419 Sepsis, unspecified organism: Secondary | ICD-10-CM | POA: Diagnosis not present

## 2024-07-21 DIAGNOSIS — E1122 Type 2 diabetes mellitus with diabetic chronic kidney disease: Secondary | ICD-10-CM | POA: Diagnosis not present

## 2024-07-21 DIAGNOSIS — J188 Other pneumonia, unspecified organism: Secondary | ICD-10-CM | POA: Diagnosis not present

## 2024-07-21 DIAGNOSIS — I130 Hypertensive heart and chronic kidney disease with heart failure and stage 1 through stage 4 chronic kidney disease, or unspecified chronic kidney disease: Secondary | ICD-10-CM | POA: Diagnosis not present

## 2024-07-28 DIAGNOSIS — N184 Chronic kidney disease, stage 4 (severe): Secondary | ICD-10-CM | POA: Diagnosis not present

## 2024-07-29 DIAGNOSIS — Z79899 Other long term (current) drug therapy: Secondary | ICD-10-CM | POA: Diagnosis not present

## 2024-08-05 ENCOUNTER — Ambulatory Visit

## 2024-08-05 ENCOUNTER — Encounter: Payer: Self-pay | Admitting: Psychology

## 2024-08-05 ENCOUNTER — Institutional Professional Consult (permissible substitution): Admitting: Psychology

## 2024-08-06 ENCOUNTER — Institutional Professional Consult (permissible substitution): Admitting: Psychology

## 2024-08-06 ENCOUNTER — Ambulatory Visit

## 2024-08-09 DIAGNOSIS — J439 Emphysema, unspecified: Secondary | ICD-10-CM | POA: Diagnosis not present

## 2024-08-09 DIAGNOSIS — I5042 Chronic combined systolic (congestive) and diastolic (congestive) heart failure: Secondary | ICD-10-CM | POA: Diagnosis not present

## 2024-08-09 DIAGNOSIS — I130 Hypertensive heart and chronic kidney disease with heart failure and stage 1 through stage 4 chronic kidney disease, or unspecified chronic kidney disease: Secondary | ICD-10-CM | POA: Diagnosis not present

## 2024-08-09 DIAGNOSIS — E1122 Type 2 diabetes mellitus with diabetic chronic kidney disease: Secondary | ICD-10-CM | POA: Diagnosis not present

## 2024-08-09 DIAGNOSIS — J188 Other pneumonia, unspecified organism: Secondary | ICD-10-CM | POA: Diagnosis not present

## 2024-08-09 DIAGNOSIS — A419 Sepsis, unspecified organism: Secondary | ICD-10-CM | POA: Diagnosis not present

## 2024-08-09 DIAGNOSIS — J440 Chronic obstructive pulmonary disease with acute lower respiratory infection: Secondary | ICD-10-CM | POA: Diagnosis not present

## 2024-08-09 DIAGNOSIS — N184 Chronic kidney disease, stage 4 (severe): Secondary | ICD-10-CM | POA: Diagnosis not present

## 2024-08-12 ENCOUNTER — Ambulatory Visit: Admitting: Internal Medicine

## 2024-08-12 ENCOUNTER — Encounter: Payer: Self-pay | Admitting: Internal Medicine

## 2024-08-12 ENCOUNTER — Ambulatory Visit

## 2024-08-12 ENCOUNTER — Encounter: Admitting: Psychology

## 2024-08-12 VITALS — BP 181/80 | HR 72 | Temp 97.7°F | Ht 63.0 in | Wt 159.0 lb

## 2024-08-12 DIAGNOSIS — R0609 Other forms of dyspnea: Secondary | ICD-10-CM

## 2024-08-12 DIAGNOSIS — J9611 Chronic respiratory failure with hypoxia: Secondary | ICD-10-CM | POA: Diagnosis not present

## 2024-08-12 DIAGNOSIS — M47896 Other spondylosis, lumbar region: Secondary | ICD-10-CM | POA: Diagnosis not present

## 2024-08-12 DIAGNOSIS — Z87891 Personal history of nicotine dependence: Secondary | ICD-10-CM

## 2024-08-12 DIAGNOSIS — R918 Other nonspecific abnormal finding of lung field: Secondary | ICD-10-CM | POA: Diagnosis not present

## 2024-08-12 DIAGNOSIS — J189 Pneumonia, unspecified organism: Secondary | ICD-10-CM

## 2024-08-12 DIAGNOSIS — M47816 Spondylosis without myelopathy or radiculopathy, lumbar region: Secondary | ICD-10-CM | POA: Diagnosis not present

## 2024-08-12 DIAGNOSIS — N184 Chronic kidney disease, stage 4 (severe): Secondary | ICD-10-CM

## 2024-08-12 LAB — HEPATIC FUNCTION PANEL
ALT: 18 U/L (ref 0–35)
AST: 25 U/L (ref 0–37)
Albumin: 3.8 g/dL (ref 3.5–5.2)
Alkaline Phosphatase: 118 U/L — ABNORMAL HIGH (ref 39–117)
Bilirubin, Direct: 0.2 mg/dL (ref 0.0–0.3)
Total Bilirubin: 0.5 mg/dL (ref 0.2–1.2)
Total Protein: 7.2 g/dL (ref 6.0–8.3)

## 2024-08-12 LAB — CBC WITH DIFFERENTIAL/PLATELET
Basophils Absolute: 0.1 K/uL (ref 0.0–0.1)
Basophils Relative: 0.6 % (ref 0.0–3.0)
Eosinophils Absolute: 0.3 K/uL (ref 0.0–0.7)
Eosinophils Relative: 2.9 % (ref 0.0–5.0)
HCT: 34.3 % — ABNORMAL LOW (ref 36.0–46.0)
Hemoglobin: 11.1 g/dL — ABNORMAL LOW (ref 12.0–15.0)
Lymphocytes Relative: 22 % (ref 12.0–46.0)
Lymphs Abs: 2.1 K/uL (ref 0.7–4.0)
MCHC: 32.2 g/dL (ref 30.0–36.0)
MCV: 89.6 fl (ref 78.0–100.0)
Monocytes Absolute: 0.8 K/uL (ref 0.1–1.0)
Monocytes Relative: 8.5 % (ref 3.0–12.0)
Neutro Abs: 6.4 K/uL (ref 1.4–7.7)
Neutrophils Relative %: 66 % (ref 43.0–77.0)
Platelets: 237 K/uL (ref 150.0–400.0)
RBC: 3.83 Mil/uL — ABNORMAL LOW (ref 3.87–5.11)
RDW: 16.6 % — ABNORMAL HIGH (ref 11.5–15.5)
WBC: 9.6 K/uL (ref 4.0–10.5)

## 2024-08-12 LAB — BASIC METABOLIC PANEL WITH GFR
BUN: 27 mg/dL — ABNORMAL HIGH (ref 6–23)
CO2: 24 meq/L (ref 19–32)
Calcium: 9 mg/dL (ref 8.4–10.5)
Chloride: 102 meq/L (ref 96–112)
Creatinine, Ser: 2.5 mg/dL — ABNORMAL HIGH (ref 0.40–1.20)
GFR: 18.14 mL/min — ABNORMAL LOW (ref >60.00)
Glucose, Bld: 96 mg/dL (ref 70–99)
Potassium: 4.6 meq/L (ref 3.5–5.1)
Sodium: 137 meq/L (ref 135–145)

## 2024-08-12 LAB — TSH: TSH: 3.04 u[IU]/mL (ref 0.35–5.50)

## 2024-08-12 LAB — SEDIMENTATION RATE: Sed Rate: 43 mm/h — ABNORMAL HIGH (ref 0–30)

## 2024-08-12 LAB — BRAIN NATRIURETIC PEPTIDE: Pro B Natriuretic peptide (BNP): 168 pg/mL — ABNORMAL HIGH (ref 0.0–100.0)

## 2024-08-12 NOTE — Patient Instructions (Addendum)
 Use 3lpm at bedtime and during the day if needed to keep the saturations above 90%   For cough/ congestion > mucinex  or mucinex  dm  up to maximum of  1200 mg every 12 hours as needed   Make sure you check your oxygen saturation  AT  your highest level of activity (not after you stop)   to be sure it stays over 90% and adjust  02 flow upward to maintain this level if needed but remember to turn it back to previous settings when you stop (to conserve your supply).    Please remember to go to the lab department   for your tests - we will call you with the results when they are available.  Please schedule a follow up office visit in 4 weeks, sooner if needed  with all medications /inhalers/ solutions in hand so we can verify exactly what you are taking. This includes all medications from all doctors and over the counters

## 2024-08-12 NOTE — Progress Notes (Unsigned)
 [name], female    DOB: 10/22/1945   MRN: [id number]   Brief patient profile:  46  yowf  quit smoking 2010  referred to pulmonary clinic 06/18/2024 for post hosp f/u    Seen by PCCM in 05/2023 cap/ resp failure   Admit date: 05/22/2024 Discharge date: 05/28/2024   Admitted From: Home Disposition: Home   Recommendations for Outpatient Follow-up:  Follow up with PCP in 1-2 weeks Please obtain BMP/CBC in one week your next doctors visit.  Augmentin  and p.o. doxycycline  prescribed 3 L of nasal cannula at home Outpatient repeat chest x-ray in 6-8 weeks   Discharge Condition: Stable CODE STATUS: Full Diet recommendation: Cardiac   Brief/Interim Summary: Brief Narrative:  78 year old with history of HTN, CHF, COPD/asthma, chronic pancreatitis, DM 2, depression, diabetes with neuropathy, PVD recently diagnosed with pneumonia a week ago and completed cefdinir.  Continued to have shortness of breath therefore admitted to the hospital.  Chest x-ray showed concerns of multifocal pneumonia therefore started on broad-spectrum antibiotics.  Also required diuretics during the hospitalization due to some signs of volume overload.  VQ scan was negative for PE and lower extremity Dopplers were negative for DVT.  Hospital course was complicated by AKI but improved with hydration. Eventually patient's respiratory status started improving.  With ambulation still required 3 L nasal cannula therefore this was arranged with home health services.  Patient today is medically stable to be discharged on p.o. antibiotics and follow-up outpatient with PCP.     Assessment & Plan:  Principal Problem:   Multifocal pneumonia   CKD (chronic kidney disease) stage 4, GFR 15-29 ml/min (HCC)   Chronic combined systolic and diastolic CHF (congestive heart failure) (HCC)   Dyslipidemia   Chronic depression   Iron  deficiency anemia, unspecified    Asthma   Chronic pancreatitis (HCC)   Type 2 diabetes mellitus with  diabetic peripheral angiopathy without gangrene (HCC)   PVD (peripheral vascular disease) (HCC)   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HCC)      Sepsis POA Multifocal pneumonia Acute hypoxic respiratory failure:  Recently completed antibiotics and failed outpatient treatment (  cefdinir) admitted with sepsis/multifocal pneumonia -remains hemodynamically stable leukocytosis resolved. Blood culture NGTD.  Urine antigen strep negative. MRSA PCR negative.  Leukocytosis have resolved. -Will transition patient to p.o. Augmentin  and doxycycline  to finish the course. -Ambulatory pulse ox showing hypoxia down to 78% on  RA, needing 3L [place]   Hypomagnesemia - Repleted prior to discharge   D-dimer positive: VQ scan negative, bilateral lower extremity Dopplers negative for DVT   AKI on CKD stage 4 Metabolic acidosis: Creatinine peaked around 2.4, now stabilizing around baseline of 1.7   COPD Asthma: No wheezing, continue inhalers, Singulair .   Peripheral vascular disease Carotid artery disease Cont Plavix  and statin   Hypertension Chronic diastolic CHF, EF 44% Nonsustained V. tach Leg swelling worse from baseline Hypomagnesemia: Echo updated 8/20 EF normal 55-60, G1 DD RV size normal, MV-degenerative, AV not well-visualized.  Continue home Toprol , Lasix    Chronic knee pain and ambulatory dysfunction On Chronic norco: Continue hydrocodone  at lower dose. She chronically takes it As per daughter in law/son,  she is mostly bedbound by her choice but she is ambulatory  Cont PT OT    Chronic diarrhea Chronic pancreatitis: Cont lomotil  prn   Dyslipidemia cont Lipitor.   Chronic depression: Continue home doxepin , Remeron , Effexor    Chronic Rash/eczema: Appears generalized on extremities and back with excoriation, going on for 3 months. 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 off isolation.  Previous provider discussed with infectious disease   Seizure disorder: continue home Keppra    IDA/anemia of CKD: Hemoglobin around baseline 10.0   T2DM with diabetic peripheral angiopathy without gangrene: A1c 6.1.  Sliding scale and Accu-Cheks   History of Present Illness  06/18/2024  Pulmonary/ 1st office eval/[name]  Chief Complaint  Patient presents with   Consult    Pna   Dyspnea:  very  sedentary  / can't do mb and back flat s stopping on the way back to [place] due to R knee and breathing Cough: some every day but no pattern / sometimes brown but not bloody lateley on plavix  and asa  Sleep: flat on couch  SABA use: has one not using afraid of getting dep  02 use:has refillable system  3 lpm home conc refuses to wear  portable 02 or do monitor Patient Instructions  Make sure you check your oxygen saturation  AT  your highest level of activity (not after you stop)   to be sure it stays over 90%  For cough/ congestion > mucinex  or mucinex  dm  up to maximum of  1200 mg every 12 hours as needed  We will ask DME to supply you with humidity for your concentrator  We walk you today to see if you qualify for a Portable concentrator  Please schedule a follow up office visit in 8 weeks, sooner if needed  with all medications /inhalers/ solutions in hand  Lab Results  Component Value Date   ESRSEDRATE 78 (H) 06/18/2024    Cxr 06/18/24 1. Interstitial and airspace infiltrates throughout the right lung, more focal at the right lung base, likely infectious in the acute setting. Improved since prior examination but not yet completely resolved. 2. Follow-up chest radiograph in 3-4 weeks is recommended to document complete resolution. If persistent, contrast-enhanced CT is recommended to exclude a central obstructing lesion.  08/12/2024  f/u ov/[name] re: doe/ resp failure sp pna maint on prn saba   Chief Complaint  Patient presents with   Asthma   COPD    Coughing up  sputum (yellow in color)  Dyspnea:  can do grocery store limited by knees but still  can do multiple aisles s 02   Cough: variable congested/ discolreoed   Sleeping: 1 pillow  resp cc  02: none   No obvious day to day or daytime variability or assoc  mucus plugs or hemoptysis or cp or chest tightness, subjective wheeze or overt sinus or hb symptoms.    Also denies any obvious fluctuation of symptoms with weather or environmental changes or other aggravating or alleviating factors except as outlined above   No unusual exposure hx or h/o childhood pna/ asthma or knowledge of premature birth.  Current Allergies, Complete Past Medical History, Past Surgical History, Family History, and Social History were reviewed in [employer] record.    Current Meds  Medication Sig   albuterol  (VENTOLIN  HFA) 108 (90 Base) MCG/ACT inhaler Inhale 1-2 puffs into the lungs every 6 (six) hours as needed for wheezing or shortness of breath.   amLODipine  (NORVASC ) 10 MG tablet Take 1 tablet (10 mg total) by mouth daily.   atorvastatin  (LIPITOR) 40 MG tablet Take 1 tablet (40 mg total) by mouth daily.   cefdinir (OMNICEF) 300 MG capsule one capsule with  food Orally twice a day; Duration: 10 days   clopidogrel  (PLAVIX ) 75 MG tablet Take 1 tablet (75 mg total) by mouth daily.   diphenoxylate -atropine  (LOMOTIL ) 2.5-0.025 MG tablet Take 1 tablet by mouth 4 (four) times daily as needed.   doxepin  (SINEQUAN ) 10 MG/ML solution Take 0.3 mLs by mouth at bedtime.   ferrous gluconate  (FERGON) 324 MG tablet Take 1 tablet (324 mg total) by mouth daily with breakfast.   fluticasone  (FLONASE ) 50 MCG/ACT nasal spray Place 2 sprays into both nostrils daily as needed for allergies.   furosemide  (LASIX ) 20 MG tablet Take 1 tablet (20 mg total) by mouth daily as needed.   HYDROcodone -acetaminophen  (NORCO) 10-325 MG tablet Take 1 tablet by mouth 4 (four) times daily as needed.   iron  polysaccharides (NIFEREX)  150 MG capsule Take 150 mg by mouth daily.   levETIRAcetam  (KEPPRA ) 500 MG tablet TAKE 1 TABLET BY MOUTH TWICE A DAY   meclizine  (ANTIVERT ) 25 MG tablet Take 0.5 tablets (12.5 mg total) by mouth 3 (three) times daily as needed for dizziness.   metoprolol  succinate (TOPROL -XL) 100 MG 24 hr tablet Take 100 mg by mouth daily.   mirtazapine  (REMERON ) 15 MG tablet Take 15 mg by mouth at bedtime.   montelukast  (SINGULAIR ) 10 MG tablet Take 10 mg by mouth daily as needed.   mupirocin  ointment (BACTROBAN ) 2 % Apply 1 Application topically 2 (two) times daily.   nitroGLYCERIN  (NITROSTAT ) 0.4 MG SL tablet Place 1 tablet (0.4 mg total) under the tongue every 5 (five) minutes as needed for chest pain.   nystatin-triamcinolone  (MYCOLOG II) cream Apply 1 Application topically 2 (two) times daily.   ondansetron  (ZOFRAN ) 4 MG tablet Take 1 tablet (4 mg total) by mouth every 6 (six) hours as needed for nausea or vomiting.   pantoprazole  (PROTONIX ) 40 MG tablet Take 1 tablet (40 mg total) by mouth 2 (two) times daily.   venlafaxine  XR (EFFEXOR -XR) 75 MG 24 hr capsule Take 1 capsule (75 mg total) by mouth daily.   vitamin B-12 (CYANOCOBALAMIN ) 500 MCG tablet Take 500 mcg by mouth daily.   Vitamin D , Ergocalciferol , (DRISDOL ) 1.25 MG (50000 UT) CAPS capsule Take 50,000 Units by mouth every Wednesday.          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 known 60-70% mid-LAD stenosis with caths in 1999, 2002, 2006, and 2012 showing stable anatomy b. 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HCC)    Diabetes mellitus    Diverticulosis    Dizziness    chronic   Dyslipidemia    Fatty liver    GERD (gastroesophageal reflux disease)    hiatial hernia   Heart murmur    Hiatal hernia     Hypertension    Irritable bowel syndrome    Pancreatitis, chronic (HCC)    Peripheral vascular disease (HCC) 02/20/2010   s/p stent of left SFA; carotid stenosis   Seizure (HCC) 11/04/2018   last 1 yr 2020 per pt unsure month    Sleep apnea    no cpap    Stroke [facility])    Thyroid  nodule    Vitamin  B12 deficiency       Objective:    Wt Readings from Last 3 Encounters:  08/12/24 159 lb (72.1 kg)  06/18/24 162 lb (73.5 kg)  05/23/24 163 lb 5.8 oz (74.1 kg)      Vital signs reviewed  08/12/2024  - Note at rest 02 sats  92% on RA   General appearance:    amb wf nad     HEENT : Oropharynx  clear       NECK :  without  apparent JVD/ palpable Nodes/TM    LUNGS: no acc muscle use,  Nl contour chest which is clear to A and P bilaterally without cough on insp or exp maneuvers   CV:  RRR  no s3 or murmur or increase in P2, and  pos pitting edema both LEs  ABD:  soft and nontender   MS:      ext warm without deformities Or obvious joint restrictions  calf tenderness, cyanosis or clubbing    SKIN: warm and dry without lesions    NEURO:  alert, approp, nl sensorium with  no motor or cerebellar deficits apparent.     Labs ordered/ reviewed:      Chemistry      Component Value Date/Time   NA 137 08/12/2024 1342   NA 141 02/18/2023 1449   K 4.6 08/12/2024 1342   CL 102 08/12/2024 1342   CO2 24 08/12/2024 1342   BUN 27 (H) 08/12/2024 1342   BUN 16 02/18/2023 1449   CREATININE 2.50 (H) 08/12/2024 1342      Component Value Date/Time   CALCIUM  9.0 08/12/2024 1342   ALKPHOS 118 (H) 08/12/2024 1342   AST 25 08/12/2024 1342   ALT 18 08/12/2024 1342   BILITOT 0.5 08/12/2024 1342     Albumin                       3.8                08/12/2024       Lab Results  Component Value Date   CREATININE 2.50 (H) 08/12/2024   CREATININE 1.66 (H) 06/18/2024   CREATININE 1.69 (H) 05/28/2024       Lab Results  Component Value Date   WBC 9.6 08/12/2024   HGB 11.1 (L)  08/12/2024   HCT 34.3 (L) 08/12/2024   MCV 89.6 08/12/2024   PLT 237.0 08/12/2024     Lab Results  Component Value Date   DDIMER 2.00 (H) 08/12/2024      Lab Results  Component Value Date   TSH 3.04 08/12/2024     Lab Results  Component Value Date   PROBNP 168.0 (H) 08/12/2024       Lab Results  Component Value Date   ESRSEDRATE 43 (H) 08/12/2024   ESRSEDRATE 78 (H) 06/18/2024   ESRSEDRATE 30 03/31/2023       CXR PA and Lateral:   08/12/2024 :    I personally reviewed images and impression is as follows:     Marked serial improvement in RLL AS dz        Assessment     Assessment & Plan Chronic respiratory failure with hypoxia (HCC) 02 dep at 3lpm hs and with walking as of 06/18/2024  - no longer using walking as of 08/12/2024 but due to issues with renal function and vol overload >>>   rec at least use 3lpm hs   DOE (dyspnea on exertion) Quit smoking  2010 - PFT's  8/28//18  FEV1 1.73 (86 % ) ratio 0.83  p 0 % improvement from saba p 0 prior to study with DLCO  12.4 (61% %)   and FV curve nl  and ERV 6%  at wt 178   - Echo 05/23/24 Grade 1 diastolic dysfunction with nl LA/RA - VQ lung scan 05/23/24 neg for PE -06/18/2024  at wt 162  Walked on 3lpm  cont  x  3  lap(s) =  approx 750  ft  @ slow pace s desats  but could not maintain on POC @ 4lpm   - 08/12/2024 no longer using 02, walking aisles at grocery store  >>> encouraged to continue as much activity as tol but monitor sats at peak ex   - DOE labs ok x for creat (see sep a/p) and d dimer (which is actually trending much lower since VQ done for the same reasons and was neg    CKD (chronic kidney disease) stage 4, GFR 15-29 ml/min (HCC) -Complicated by HBP and mild fluid overload as of 08/12/2024   Lab Results  Component Value Date   CREATININE 2.50 (H) 08/12/2024   CREATININE 1.66 (H) 06/18/2024   CREATININE 1.69 (H) 05/28/2024    >>>  No obvious nephrotoxin exposure, needs close renal f/u    Community  acquired pneumonia of right lower lobe of lung Marked serial improvement in cxr/ 02 sats and ESR so no further abx planned  >>> mucinex  dm 1200 mg bid prn cough / continue prn saba for doe    F/u 4 weeks with all meds in hand using a trust but verify approach to confirm accurate Medication  Reconciliation The principal here is that until we are certain that the  patients are doing what we've asked, it makes no sense to ask them to do more.          Each maintenance medication was reviewed in detail including emphasizing most importantly the difference between maintenance and prns and under what circumstances the prns are to be triggered using an action plan format where appropriate.  Total time for H and P, chart review, counseling, reviewing hfa  device(s) and generating customized AVS unique to this office visit / same day charting = 35 min       AVS  Patient Instructions  Use 3lpm at bedtime and during the day if needed to keep the saturations above 90%   For cough/ congestion > mucinex  or mucinex  dm  up to maximum of  1200 mg every 12 hours as needed   Make sure you check your oxygen saturation  AT  your highest level of activity (not after you stop)   to be sure it stays over 90% and adjust  02 flow upward to maintain this level if needed but remember to turn it back to previous settings when you stop (to conserve your supply).    Please remember to go to the lab department   for your tests - we will call you with the results when they are available.  Please schedule a follow up office visit in 4 weeks, sooner if needed  with all medications /inhalers/ solutions in hand so we can verify exactly what you are taking. This includes all medications from all doctors and over the counters             Ozell America, MD 08/13/2024

## 2024-08-13 ENCOUNTER — Institutional Professional Consult (permissible substitution): Admitting: Psychology

## 2024-08-13 ENCOUNTER — Ambulatory Visit

## 2024-08-13 DIAGNOSIS — J440 Chronic obstructive pulmonary disease with acute lower respiratory infection: Secondary | ICD-10-CM | POA: Diagnosis not present

## 2024-08-13 DIAGNOSIS — N184 Chronic kidney disease, stage 4 (severe): Secondary | ICD-10-CM | POA: Diagnosis not present

## 2024-08-13 DIAGNOSIS — E1122 Type 2 diabetes mellitus with diabetic chronic kidney disease: Secondary | ICD-10-CM | POA: Diagnosis not present

## 2024-08-13 DIAGNOSIS — J439 Emphysema, unspecified: Secondary | ICD-10-CM | POA: Diagnosis not present

## 2024-08-13 DIAGNOSIS — I130 Hypertensive heart and chronic kidney disease with heart failure and stage 1 through stage 4 chronic kidney disease, or unspecified chronic kidney disease: Secondary | ICD-10-CM | POA: Diagnosis not present

## 2024-08-13 DIAGNOSIS — I5042 Chronic combined systolic (congestive) and diastolic (congestive) heart failure: Secondary | ICD-10-CM | POA: Diagnosis not present

## 2024-08-13 DIAGNOSIS — J188 Other pneumonia, unspecified organism: Secondary | ICD-10-CM | POA: Diagnosis not present

## 2024-08-13 DIAGNOSIS — J9601 Acute respiratory failure with hypoxia: Secondary | ICD-10-CM | POA: Diagnosis not present

## 2024-08-13 DIAGNOSIS — A419 Sepsis, unspecified organism: Secondary | ICD-10-CM | POA: Diagnosis not present

## 2024-08-13 LAB — D-DIMER, QUANTITATIVE: D-Dimer, Quant: 2 ug{FEU}/mL — ABNORMAL HIGH (ref <0.50)

## 2024-08-13 NOTE — Assessment & Plan Note (Addendum)
 02 dep at 3lpm hs and with walking as of 06/18/2024  - no longer using walking as of 08/12/2024 but due to issues with renal function and vol overload >>>   rec at least use 3lpm hs

## 2024-08-13 NOTE — Assessment & Plan Note (Addendum)
-  Complicated by HBP and mild fluid overload as of 08/12/2024   Lab Results  Component Value Date   CREATININE 2.50 (H) 08/12/2024   CREATININE 1.66 (H) 06/18/2024   CREATININE 1.69 (H) 05/28/2024    >>>  No obvious nephrotoxin exposure, needs close renal f/u

## 2024-08-13 NOTE — Assessment & Plan Note (Addendum)
 Marked serial improvement in cxr/ 02 sats and ESR so no further abx planned  >>> mucinex  dm 1200 mg bid prn cough / continue prn saba for doe

## 2024-08-13 NOTE — Assessment & Plan Note (Addendum)
 Quit smoking 2010 - PFT's  8/28//18  FEV1 1.73 (86 % ) ratio 0.83  p 0 % improvement from saba p 0 prior to study with DLCO  12.4 (61% %)   and FV curve nl  and ERV 6%  at wt 178   - Echo 05/23/24 Grade 1 diastolic dysfunction with nl LA/RA - VQ lung scan 05/23/24 neg for PE -06/18/2024  at wt 162  Walked on 3lpm  cont  x  3  lap(s) =  approx 750  ft  @ slow pace s desats  but could not maintain on POC @ 4lpm   - 08/12/2024 no longer using 02, walking aisles at grocery store  >>> encouraged to continue as much activity as tol but monitor sats at peak ex   - DOE labs ok x for creat (see sep a/p) and d dimer (which is actually trending much lower since VQ done for the same reasons and was neg

## 2024-08-14 ENCOUNTER — Ambulatory Visit: Payer: Self-pay | Admitting: Internal Medicine

## 2024-08-16 ENCOUNTER — Institutional Professional Consult (permissible substitution): Admitting: Psychology

## 2024-08-16 ENCOUNTER — Ambulatory Visit

## 2024-08-16 NOTE — Telephone Encounter (Signed)
 X1 tried to reach out to patient unable to leave message.

## 2024-08-17 NOTE — Progress Notes (Signed)
 Called and spoke to pt - advised of CXR results per Dr. Darlean. Pt verbalized understanding, NFN.

## 2024-08-23 ENCOUNTER — Encounter: Admitting: Psychology

## 2024-08-23 DIAGNOSIS — M170 Bilateral primary osteoarthritis of knee: Secondary | ICD-10-CM | POA: Diagnosis not present

## 2024-08-23 NOTE — Telephone Encounter (Signed)
 Attempted to reach patient to remind her she is due for repeat IBC/Ferritin labs. Unfortunately, no answer and no voicemail.

## 2024-08-24 DIAGNOSIS — I130 Hypertensive heart and chronic kidney disease with heart failure and stage 1 through stage 4 chronic kidney disease, or unspecified chronic kidney disease: Secondary | ICD-10-CM | POA: Diagnosis not present

## 2024-08-24 DIAGNOSIS — N184 Chronic kidney disease, stage 4 (severe): Secondary | ICD-10-CM | POA: Diagnosis not present

## 2024-08-24 DIAGNOSIS — A419 Sepsis, unspecified organism: Secondary | ICD-10-CM | POA: Diagnosis not present

## 2024-08-24 DIAGNOSIS — E1122 Type 2 diabetes mellitus with diabetic chronic kidney disease: Secondary | ICD-10-CM | POA: Diagnosis not present

## 2024-08-24 DIAGNOSIS — J440 Chronic obstructive pulmonary disease with acute lower respiratory infection: Secondary | ICD-10-CM | POA: Diagnosis not present

## 2024-08-24 DIAGNOSIS — J439 Emphysema, unspecified: Secondary | ICD-10-CM | POA: Diagnosis not present

## 2024-08-24 DIAGNOSIS — I5042 Chronic combined systolic (congestive) and diastolic (congestive) heart failure: Secondary | ICD-10-CM | POA: Diagnosis not present

## 2024-08-24 DIAGNOSIS — J9601 Acute respiratory failure with hypoxia: Secondary | ICD-10-CM | POA: Diagnosis not present

## 2024-08-24 DIAGNOSIS — J188 Other pneumonia, unspecified organism: Secondary | ICD-10-CM | POA: Diagnosis not present

## 2024-08-24 NOTE — Telephone Encounter (Signed)
 Spoke with patient. Reminded her that she is due for labs this week at [facility]. She verbalizes understanding. Originally states that she thought she had an appointment in the clinic this week but I advised her that her office appointment is 09/27/24 at 10:20 am.

## 2024-08-25 ENCOUNTER — Other Ambulatory Visit (INDEPENDENT_AMBULATORY_CARE_PROVIDER_SITE_OTHER)

## 2024-08-25 ENCOUNTER — Ambulatory Visit: Payer: Self-pay | Admitting: Gastroenterology

## 2024-08-25 DIAGNOSIS — D509 Iron deficiency anemia, unspecified: Secondary | ICD-10-CM

## 2024-08-25 LAB — IBC + FERRITIN
Ferritin: 17.7 ng/mL (ref 10.0–291.0)
Iron: 52 ug/dL (ref 42–145)
Saturation Ratios: 11.3 % — ABNORMAL LOW (ref 20.0–50.0)
TIBC: 462 ug/dL — ABNORMAL HIGH (ref 250.0–450.0)
Transferrin: 330 mg/dL (ref 212.0–360.0)

## 2024-08-31 NOTE — Progress Notes (Signed)
 I called and spoke to [name]. [name] informed of Dr Leisa note and verbalized understanding. [name]'s PCP is not in the [facility] and I do not know who her renal doctor is and [name] could not give me a name. I informed [name] to have her doctors request a copy from our office so they could have this. NFN

## 2024-09-03 NOTE — Progress Notes (Signed)
 Assessment and Plan Assessment & Plan        Follow up in   months.*** Start donepezil 10 mg daily, side effects discussed*** Continue the use of CPAP for OSA Recommend good control of her cardiovascular risk factors*** Continue to control mood as per PCP*** Proceed with neuropsych evaluation to further evaluate cognitive concerns and determine other underlying causes of memory changes including potential contribution from sleep, anxiety, tension, depression, among others.     [name] is a very pleasant 78 y.o. 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  Since her last visit, the patient has not proceeded with neuropsych evaluation to further evaluate any other causes of memory loss.  This patient is accompanied in the office by her son*** who supplements the history.  Previous records as well as any outside records available were reviewed prior to todays visit. Patient was last seen on ***  Discussed the use of AI scribe software for clinical note transcription with the patient, who gave verbal consent to proceed.  History of Present Illness      Any changes in memory since last visit?  repeats oneself?  Endorsed Disoriented when walking into a room? Denies ***  Leaving objects?  May misplace things but not in unusual places***  Wandering behavior?  denies   Any personality changes since last visit?  Denies.   Any worsening depression?:   Denies.   Hallucinations or paranoia?  Denies.   Seizures? denies    Any sleep changes?  Denies vivid dreams, REM behavior or sleepwalking   Sleep apnea?   Denies.   Any hygiene concerns? Denies.  Independent of bathing and dressing?  Endorsed  Does the patient needs help with medications?   is in charge *** Who is in charge of the finances?   is in charge   *** Any changes in appetite?  denies ***   Patient have trouble swallowing? Denies.   Does the patient cook? No Any headaches?   denies   Any vision changes?*** Chronic back pain  denies   Ambulates with difficulty? Denies.  *** Recent falls or head injuries? Denies.     Unilateral weakness, numbness or tingling? denies   Any tremors?  Denies  *** Any anosmia?  Denies   Any incontinence of urine?  Endorsed***  Any bowel dysfunction?   Denies      Patient lives   *** Does the patient drive? No longer drives ***  Initial evaluation 04/08/2024 How long did patient have memory difficulties?  For about 1 year, worse for the last few months, with trouble finding the right word or following a conversation.  Many times I know the words but they do not come out of my mouth -she says.  Patient reports more difficulty remembering new information,names of her grandchildren and great-grandchildren.  She also forgets important dates.  She is still able to follow directions.She reports that since being into a dated for about 2 weeks, my tongue  being on the right side, now I have more trouble speaking, like my tongue is on my side . repeats oneself?  Occasionally.  Disoriented when walking into a room? Only when I had the episode of confusion I was disoriented.   Leaving objects in unusual places?  Denies.   Wandering behavior? Denies.   Any personality changes, or depression, anxiety?  She finds it more difficult to keep her house clean which is unlike her, and is more anxious and irritable than before   Hallucinations or paranoia? may  have auditory hallucinations, a grand baby was calling her name, but he was not there -her son says.  No visual hallucinations Seizures?  She has a history of seizures, initially seen at [facility] in 2020.  Patient missed several appointments and was discharged from the practice.  She continues to be on Keppra  but her PCP and is not very  compliant with the medication. She admits forgetting the doses. Since Nov she had 2-3 episodes of confusion.  She had episodes in which she fell, and she thought family members were there calling their names, including that of her baby great grandson. No aura. She does not remember if she had any tongue biting. No metallic or blood taste. No teeth fracture. Denies foam in her mouth during the episode. When she fell she hit the frontal area. I was sore all over. She is not sure if she had body jerks,  but she reports that she urinated on herself.  She does not recall how long they lasted.   Any sleep changes? Does not sleep well. Denies frequent nightmares or dream reenactment, other REM behavior or sleepwalking   Sleep apnea? She is not using the CPAP Any hygiene concerns?  Denies.   Independent of bathing and dressing? Endorsed  Does the patient need help with medications? Patient is in charge, may be missing her doses   Who is in charge of the finances?  Patient  is in charge     Any changes in appetite?   Denies.     Patient have trouble swallowing?  Denies.   Does the patient cook? No Any headaches?  Sometimes at night but not long lasting.  Chronic pain? Denies.   Ambulates with difficulty? She ambulates with some difficulty, does not like using  a walker or cane.  Recent falls or head injuries?  8 months ago, falling and then not recognizing her sons. She had head injury and LOC during that time.  Vision changes?  Denies any new issues.    Any strokelike symptoms?  Residual speech changes since November 2024, after being intubated, she is not sure if she had  speech therapy at the time.  She did not regain full speech capacity.  She denies any other areas of weakness or numbness. Any tremors? Denies.  Any anosmia? Denies.   Any incontinence of urine? Denies  Any bowel dysfunction? Denies.      Patient lives with her oldest son . History of heavy alcohol  intake? Denies.   History of heavy tobacco use? Denies.   Family history of dementia?  No Does patient drive?  Not driving at this point    Stroke workup studies from September 2024  MRI 1.8 cm focus FLAIR hyperintense involving cortical and subcortical aspect was parietal lobe -- likely reflecting evolving subacute ischemic infarct.  New from 01/02/2023.     MRA: Chronic occlusion of left ICA     Carotid Doppler right carotid ECA greater than  50% stenosed, patient stent in place.  Left carotid showed hemodynamically significant plaque greater than 50% in common carotid artery.  Vertebral arteries WNL.     LDL 27     HgbA1c 5.9    VTE prophylaxis -aspirin  81 mg, subcu heparin , discontinue Plavix , start Brilinta  90 mg. Cardiac stent placed 02/2023 Discharge on aspirin /Brilinta  on DAPT, as aspirin /Plavix  regimen has failed.   MRI of the brain 04/08/2024, personally reviewed without acute findings, remarkable for moderate chronic small vessel ischemic changes within the pons and cerebral hemisphere white matter, old left cortical infarction     CURRENT MEDICATIONS:  Outpatient Encounter Medications as of 09/06/2024  Medication Sig   albuterol  (VENTOLIN  HFA) 108 (90 Base) MCG/ACT inhaler Inhale 1-2 puffs into the lungs every 6 (six) hours as needed for wheezing or shortness of breath.   amLODipine  (NORVASC ) 10 MG tablet Take 1 tablet (10 mg total) by mouth daily.   atorvastatin  (LIPITOR) 40 MG tablet Take 1 tablet (40 mg total) by mouth daily.   cefdinir (OMNICEF) 300 MG capsule one capsule with food Orally twice a day; Duration: 10 days   clopidogrel  (PLAVIX ) 75 MG tablet Take 1 tablet (75 mg  total) by mouth daily.   diphenoxylate -atropine  (LOMOTIL ) 2.5-0.025 MG tablet Take 1 tablet by mouth 4 (four) times daily as needed.   doxepin  (SINEQUAN ) 10 MG/ML solution Take 0.3 mLs by mouth at bedtime.   doxycycline  (VIBRA -TABS) 100 MG tablet 1 tablet Orally twice a day; Duration: 10 days (Patient not taking: Reported on 08/12/2024)   feeding supplement (ENSURE ENLIVE / ENSURE PLUS) LIQD Take 237 mLs by mouth 2 (two) times daily between meals. (Patient not taking: Reported on 08/12/2024)   ferrous gluconate  (FERGON) 324 MG tablet Take 1 tablet (324 mg total) by mouth daily with breakfast.   fluticasone  (FLONASE ) 50 MCG/ACT nasal spray Place 2 sprays into both nostrils daily as needed for allergies.   furosemide  (LASIX ) 20 MG tablet Take 1 tablet (20 mg total) by mouth daily as needed.   HYDROcodone -acetaminophen  (NORCO) 10-325 MG tablet Take 1 tablet by mouth 4 (four) times daily as needed.   iron  polysaccharides (NIFEREX) 150 MG capsule Take 150 mg by mouth daily.   levETIRAcetam  (KEPPRA ) 500 MG tablet TAKE 1 TABLET BY MOUTH TWICE A DAY   meclizine  (ANTIVERT ) 25 MG tablet Take 0.5 tablets (12.5 mg total) by mouth 3 (three) times daily as needed for dizziness.   metoprolol  succinate (TOPROL -XL) 100 MG 24 hr tablet Take 100 mg by mouth daily.   mirtazapine  (REMERON ) 15 MG tablet Take 15 mg by mouth at bedtime.   montelukast  (SINGULAIR ) 10 MG tablet Take 10 mg by mouth daily as needed.   mupirocin  ointment (BACTROBAN ) 2 % Apply 1 Application topically 2 (two) times daily.   nitroGLYCERIN  (NITROSTAT ) 0.4 MG SL tablet Place 1 tablet (0.4 mg total) under the tongue every 5 (five) minutes as needed for chest pain.   nystatin-triamcinolone  (MYCOLOG II) cream Apply 1 Application topically 2 (two) times daily.   ondansetron  (ZOFRAN ) 4 MG tablet Take 1 tablet (4 mg total) by mouth every 6 (six) hours as needed for nausea or vomiting.   pantoprazole  (PROTONIX ) 40 MG tablet Take 1 tablet (40 mg total) by  mouth 2 (two) times daily.   venlafaxine  XR (EFFEXOR -XR) 75 MG 24 hr capsule Take 1 capsule (75 mg total) by mouth daily.   vitamin B-12 (CYANOCOBALAMIN ) 500 MCG tablet Take 500 mcg by mouth daily.   Vitamin D , Ergocalciferol , (DRISDOL )  1.25 MG (50000 UT) CAPS capsule Take 50,000 Units by mouth every Wednesday.    No facility-administered encounter medications on file as of 09/06/2024.        No data to display            04/08/2024    7:00 PM  Montreal Cognitive Assessment   Visuospatial/ Executive (0/5) 2  Naming (0/3) 3  Attention: Read list of digits (0/2) 2  Attention: Read list of letters (0/1) 1  Attention: Serial 7 subtraction starting at 100 (0/3) 0  Language: Repeat phrase (0/2) 2  Language : Fluency (0/1) 0  Abstraction (0/2) 1  Delayed Recall (0/5) 3  Orientation (0/6) 3  Total 17  Adjusted Score (based on education) 18    Results     Objective:    Neurological Exam:    VITALS:  There were no vitals filed for this visit.  GEN:  The patient appears stated age and is in NAD. HEENT:  Normocephalic, atraumatic.   Neurological examination:  General: NAD, well-groomed, appears stated age. Orientation: The patient is alert. Oriented to person, place and date Cranial nerves: There is good facial symmetry.The speech is fluent and clear. No aphasia or dysarthria. Fund of knowledge is appropriate. Recent and remote memory are impaired. Attention and concentration are reduced. Able to name objects and repeat phrases.  Hearing is intact to conversational tone. *** Sensation: Sensation is intact to light touch throughout Motor: Strength is at least antigravity x4. DTR's 2/4 in UE/LE     Movement examination: Tone: There is normal tone in the UE/LE Abnormal movements:  no tremor.  No myoclonus.  No asterixis.   Coordination:  There is no decremation with RAM's. Normal finger to nose  Gait and Station: The patient has no*** difficulty arising out of a deep-seated  chair without the use of the hands. The patient's stride length is good.  Gait is cautious and narrow.    Thank you for allowing us  the opportunity to participate in the care of this nice patient. Please do not hesitate to contact us  for any questions or concerns.   Total time spent on today's visit was *** minutes dedicated to this patient today, preparing to see patient, examining the patient, ordering tests and/or medications and counseling the patient, documenting clinical information in the EHR or other health record, independently interpreting results and communicating results to the patient/family, discussing treatment and goals, answering patient's questions and coordinating care.  Cc:  Yolande Toribio MATSU, MD  Camie Sevin 09/03/2024 7:25 AM

## 2024-09-06 ENCOUNTER — Encounter: Payer: Self-pay | Admitting: Physician Assistant

## 2024-09-06 ENCOUNTER — Ambulatory Visit (INDEPENDENT_AMBULATORY_CARE_PROVIDER_SITE_OTHER): Admitting: Physician Assistant

## 2024-09-06 VITALS — BP 133/70 | HR 74 | Ht 63.0 in | Wt 157.0 lb

## 2024-09-06 DIAGNOSIS — R413 Other amnesia: Secondary | ICD-10-CM | POA: Diagnosis not present

## 2024-09-06 NOTE — Patient Instructions (Signed)
 It was a pleasure to see you today at our office.   Recommendations:  Neurocognitive evaluation at our office   Follow up in 6 months [facility] and if placement were needed at the facility, contact Social Worker tel: [phone] Consider independent living to increase social interaction  Recommend using the CPAP    [email or web address]/neuro-[email or web address]-apps-[email or web address]-[email or web address]   RECOMMENDATIONS FOR ALL PATIENTS WITH MEMORY PROBLEMS: 1. Continue to exercise (Recommend 30 minutes of walking everyday, or 3 hours every week) 2. Increase social interactions - continue going to [place] and enjoy social gatherings with friends and family 3. Eat healthy, avoid fried foods and eat more fruits and vegetables 4. Maintain adequate blood pressure, blood sugar, and blood cholesterol level. Reducing the risk of stroke and cardiovascular disease also helps promoting better memory. 5. Avoid stressful situations. Live a simple life and avoid aggravations. Organize your time and prepare for the next day in anticipation. 6. Sleep well, avoid any interruptions of sleep and avoid any distractions in the bedroom that may interfere with adequate sleep quality 7. Avoid sugar, avoid sweets as there is a strong link between excessive sugar intake, diabetes, and cognitive impairment We discussed the Mediterranean diet, which has been shown to help patients reduce the risk of progressive memory disorders and reduces cardiovascular risk. This includes eating fish, eat fruits and green leafy vegetables, nuts like almonds and hazelnuts, walnuts, and also use olive oil. Avoid fast foods and fried foods as much as possible. Avoid sweets and sugar as sugar use has been linked to worsening of memory function.  There is always a concern of gradual progression of memory problems. If this is the case, then we may need to adjust level of care according to patient needs. Support, both to the patient  and caregiver, should then be put into place.      You have been referred for a neuropsychological evaluation (i.e., evaluation of memory and thinking abilities). Please bring someone with you to this appointment if possible, as it is helpful for the doctor to hear from both you and another adult who knows you well. Please bring eyeglasses and hearing aids if you wear them.    The evaluation will take approximately 3 hours and has two parts:   The first part is a clinical interview with the neuropsychologist (Dr. Richie or Dr. Gayland). During the interview, the neuropsychologist will speak with you and the individual you brought to the appointment.    The second part of the evaluation is testing with the doctor's technician Neal or Luke). During the testing, the technician will ask you to remember different types of material, solve problems, and answer some questionnaires. Your family member will not be present for this portion of the evaluation.   Please note: We must reserve several hours of the neuropsychologist's time and the psychometrician's time for your evaluation appointment. As such, there is a No-Show fee of $100. If you are unable to attend any of your appointments, please contact our office as soon as possible to reschedule.      DRIVING: Regarding driving, in patients with progressive memory problems, driving will be impaired. We advise to have someone else do the driving if trouble finding directions or if minor accidents are reported. Independent driving assessment is available to determine safety of driving.   If you are interested in the driving assessment, you can contact the following:  [facility] [employer] [facility] in [place] [phone]  [facility] [phone]  [facility]  [phone]  [facility] [phone] or [phone]   FALL PRECAUTIONS: Be cautious when walking. Scan the area for obstacles that may increase the risk of  trips and falls. When getting up in the mornings, sit up at the edge of the bed for a few minutes before getting out of bed. Consider elevating the bed at the head end to avoid drop of blood pressure when getting up. Walk always in a well-lit room (use night lights in the walls). Avoid area rugs or power cords from appliances in the middle of the walkways. Use a walker or a cane if necessary and consider physical therapy for balance exercise. Get your eyesight checked regularly.  FINANCIAL OVERSIGHT: Supervision, especially oversight when making financial decisions or transactions is also recommended.  HOME SAFETY: Consider the safety of the kitchen when operating appliances like stoves, microwave oven, and blender. Consider having supervision and share cooking responsibilities until no longer able to participate in those. Accidents with firearms and other hazards in the house should be identified and addressed as well.   ABILITY TO BE LEFT ALONE: If patient is unable to contact 911 operator, consider using LifeLine, or when the need is there, arrange for someone to stay with patients. Smoking is a fire hazard, consider supervision or cessation. Risk of wandering should be assessed by caregiver and if detected at any point, supervision and safe proof recommendations should be instituted.  MEDICATION SUPERVISION: Inability to self-administer medication needs to be constantly addressed. Implement a mechanism to ensure safe administration of the medications.      Mediterranean Diet A Mediterranean diet refers to food and lifestyle choices that are based on the traditions of countries located on the [employer] [place]. This way of eating has been shown to help prevent certain conditions and improve outcomes for people who have chronic diseases, like kidney disease and heart disease. What are tips for following this plan? Lifestyle  Cook and eat meals together with your family, when possible. Drink  enough fluid to keep your urine clear or pale yellow. Be physically active every day. This includes: Aerobic exercise like running or swimming. Leisure activities like gardening, walking, or housework. Get 7-8 hours of sleep each night. If recommended by your health care provider, drink red wine in moderation. This means 1 glass a day for nonpregnant women and 2 glasses a day for men. A glass of wine equals 5 oz (150 mL). Reading food labels  Check the serving size of packaged foods. For foods such as rice and pasta, the serving size refers to the amount of cooked product, not dry. Check the total fat in packaged foods. Avoid foods that have saturated fat or trans fats. Check the ingredients list for added sugars, such as corn syrup. Shopping  At the grocery store, buy most of your food from the areas near the walls of the store. This includes: Fresh fruits and vegetables (produce). Grains, beans, nuts, and seeds. Some of these may be available in unpackaged forms or large amounts (in bulk). Fresh seafood. Poultry and eggs. Low-fat dairy products. Buy whole ingredients instead of prepackaged foods. Buy fresh fruits and vegetables in-season from local farmers markets. Buy frozen fruits and vegetables in resealable bags. If you do not have access to quality fresh seafood, buy precooked frozen shrimp or canned fish, such as tuna, salmon, or sardines. Buy small amounts of raw or cooked vegetables, salads, or olives from the deli or salad bar at your store. Stock your pantry so you always  have certain foods on hand, such as olive oil, canned tuna, canned tomatoes, rice, pasta, and beans. Cooking  Cook foods with extra-virgin olive oil instead of using butter or other vegetable oils. Have meat as a side dish, and have vegetables or grains as your main dish. This means having meat in small portions or adding small amounts of meat to foods like pasta or stew. Use beans or vegetables instead of meat  in common dishes like chili or lasagna. Experiment with different cooking methods. Try roasting or broiling vegetables instead of steaming or sauteing them. Add frozen vegetables to soups, stews, pasta, or rice. Add nuts or seeds for added healthy fat at each meal. You can add these to yogurt, salads, or vegetable dishes. Marinate fish or vegetables using olive oil, lemon juice, garlic, and fresh herbs. Meal planning  Plan to eat 1 vegetarian meal one day each week. Try to work up to 2 vegetarian meals, if possible. Eat seafood 2 or more times a week. Have healthy snacks readily available, such as: Vegetable sticks with hummus. Greek yogurt. Fruit and nut trail mix. Eat balanced meals throughout the week. This includes: Fruit: 2-3 servings a day Vegetables: 4-5 servings a day Low-fat dairy: 2 servings a day Fish, poultry, or lean meat: 1 serving a day Beans and legumes: 2 or more servings a week Nuts and seeds: 1-2 servings a day Whole grains: 6-8 servings a day Extra-virgin olive oil: 3-4 servings a day Limit red meat and sweets to only a few servings a month What are my food choices? Mediterranean diet Recommended Grains: Whole-grain pasta. Brown rice. Bulgar wheat. Polenta. Couscous. Whole-wheat bread. [place] Made[place]. Vegetables: Artichokes. Beets. Broccoli. Cabbage. Carrots. Eggplant. Green beans. Chard. Kale. Spinach. Onions. Leeks. Peas. Squash. Tomatoes. Peppers. Radishes. Fruits: Apples. Apricots. Avocado. Berries. Bananas. Cherries. Dates. Figs. Grapes. Lemons. Melon. Oranges. Peaches. Plums. Pomegranate. Meats and other protein foods: Beans. Almonds. Sunflower seeds. Pine nuts. Peanuts. Cod. Salmon. Scallops. Shrimp. Tuna. Tilapia. Clams. Oysters. Eggs. Dairy: Low-fat milk. Cheese. Greek yogurt. Beverages: Water . Red wine. Herbal tea. Fats and oils: Extra virgin olive oil. Avocado oil. Grape seed oil. Sweets and desserts: Greek yogurt with honey. Baked apples. Poached  pears. Trail mix. Seasoning and other foods: Basil. Cilantro. Coriander. Cumin. Mint. Parsley. Sage. Rosemary. Tarragon. Garlic. Oregano. Thyme. Pepper. Balsalmic vinegar. Tahini. Hummus. Tomato sauce. Olives. Mushrooms. Limit these Grains: Prepackaged pasta or rice dishes. Prepackaged cereal with added sugar. Vegetables: Deep fried potatoes (french fries). Fruits: Fruit canned in syrup. Meats and other protein foods: Beef. Pork. Lamb. Poultry with skin. Hot dogs. [place]. Dairy: Ice cream. Sour cream. Whole milk. Beverages: Juice. Sugar-sweetened soft drinks. Beer. Liquor and spirits. Fats and oils: Butter. Canola oil. Vegetable oil. Beef fat (tallow). Lard. Sweets and desserts: Cookies. Cakes. Pies. Candy. Seasoning and other foods: Mayonnaise. Premade sauces and marinades. The items listed may not be a complete list. Talk with your dietitian about what dietary choices are right for you. Summary The Mediterranean diet includes both food and lifestyle choices. Eat a variety of fresh fruits and vegetables, beans, nuts, seeds, and whole grains. Limit the amount of red meat and sweets that you eat. Talk with your health care provider about whether it is safe for you to drink red wine in moderation. This means 1 glass a day for nonpregnant women and 2 glasses a day for men. A glass of wine equals 5 oz (150 mL). This information is not intended to replace advice given to you by your health  care provider. Make sure you discuss any questions you have with your health care provider. Document Released: 05/23/2016 Document Revised: 06/25/2016 Document Reviewed: 05/23/2016 Elsevier Interactive Patient Education  2017 [employer].

## 2024-09-21 DIAGNOSIS — J439 Emphysema, unspecified: Secondary | ICD-10-CM | POA: Diagnosis not present

## 2024-09-21 DIAGNOSIS — J440 Chronic obstructive pulmonary disease with acute lower respiratory infection: Secondary | ICD-10-CM | POA: Diagnosis not present

## 2024-09-21 DIAGNOSIS — N184 Chronic kidney disease, stage 4 (severe): Secondary | ICD-10-CM | POA: Diagnosis not present

## 2024-09-21 DIAGNOSIS — E1122 Type 2 diabetes mellitus with diabetic chronic kidney disease: Secondary | ICD-10-CM | POA: Diagnosis not present

## 2024-09-21 DIAGNOSIS — I130 Hypertensive heart and chronic kidney disease with heart failure and stage 1 through stage 4 chronic kidney disease, or unspecified chronic kidney disease: Secondary | ICD-10-CM | POA: Diagnosis not present

## 2024-09-21 DIAGNOSIS — J9601 Acute respiratory failure with hypoxia: Secondary | ICD-10-CM | POA: Diagnosis not present

## 2024-09-21 DIAGNOSIS — I5042 Chronic combined systolic (congestive) and diastolic (congestive) heart failure: Secondary | ICD-10-CM | POA: Diagnosis not present

## 2024-09-21 DIAGNOSIS — J188 Other pneumonia, unspecified organism: Secondary | ICD-10-CM | POA: Diagnosis not present

## 2024-09-22 DIAGNOSIS — N1832 Chronic kidney disease, stage 3b: Secondary | ICD-10-CM | POA: Diagnosis not present

## 2024-09-22 DIAGNOSIS — I129 Hypertensive chronic kidney disease with stage 1 through stage 4 chronic kidney disease, or unspecified chronic kidney disease: Secondary | ICD-10-CM | POA: Diagnosis not present

## 2024-09-22 DIAGNOSIS — D631 Anemia in chronic kidney disease: Secondary | ICD-10-CM | POA: Diagnosis not present

## 2024-09-22 DIAGNOSIS — R609 Edema, unspecified: Secondary | ICD-10-CM | POA: Diagnosis not present

## 2024-09-24 ENCOUNTER — Ambulatory Visit: Admitting: Gastroenterology

## 2024-09-27 ENCOUNTER — Ambulatory Visit: Admitting: Gastroenterology

## 2024-10-05 ENCOUNTER — Encounter: Payer: Self-pay | Admitting: Internal Medicine

## 2024-10-05 ENCOUNTER — Ambulatory Visit

## 2024-10-05 ENCOUNTER — Ambulatory Visit: Admitting: Internal Medicine

## 2024-10-05 ENCOUNTER — Other Ambulatory Visit (HOSPITAL_COMMUNITY): Payer: Self-pay | Admitting: Internal Medicine

## 2024-10-05 VITALS — BP 132/74 | HR 58 | Temp 98.1°F | Ht 63.0 in | Wt 156.4 lb

## 2024-10-05 DIAGNOSIS — F339 Major depressive disorder, recurrent, unspecified: Secondary | ICD-10-CM | POA: Diagnosis not present

## 2024-10-05 DIAGNOSIS — E1151 Type 2 diabetes mellitus with diabetic peripheral angiopathy without gangrene: Secondary | ICD-10-CM | POA: Diagnosis not present

## 2024-10-05 DIAGNOSIS — R0609 Other forms of dyspnea: Secondary | ICD-10-CM

## 2024-10-05 DIAGNOSIS — E785 Hyperlipidemia, unspecified: Secondary | ICD-10-CM | POA: Diagnosis not present

## 2024-10-05 DIAGNOSIS — I739 Peripheral vascular disease, unspecified: Secondary | ICD-10-CM | POA: Diagnosis not present

## 2024-10-05 DIAGNOSIS — I129 Hypertensive chronic kidney disease with stage 1 through stage 4 chronic kidney disease, or unspecified chronic kidney disease: Secondary | ICD-10-CM | POA: Diagnosis not present

## 2024-10-05 DIAGNOSIS — J189 Pneumonia, unspecified organism: Secondary | ICD-10-CM

## 2024-10-05 DIAGNOSIS — I5042 Chronic combined systolic (congestive) and diastolic (congestive) heart failure: Secondary | ICD-10-CM

## 2024-10-05 DIAGNOSIS — K861 Other chronic pancreatitis: Secondary | ICD-10-CM | POA: Diagnosis not present

## 2024-10-05 DIAGNOSIS — N184 Chronic kidney disease, stage 4 (severe): Secondary | ICD-10-CM | POA: Diagnosis not present

## 2024-10-05 DIAGNOSIS — J9611 Chronic respiratory failure with hypoxia: Secondary | ICD-10-CM

## 2024-10-05 DIAGNOSIS — D509 Iron deficiency anemia, unspecified: Secondary | ICD-10-CM | POA: Insufficient documentation

## 2024-10-05 DIAGNOSIS — J188 Other pneumonia, unspecified organism: Secondary | ICD-10-CM | POA: Diagnosis not present

## 2024-10-05 DIAGNOSIS — A419 Sepsis, unspecified organism: Secondary | ICD-10-CM | POA: Diagnosis not present

## 2024-10-05 DIAGNOSIS — J45909 Unspecified asthma, uncomplicated: Secondary | ICD-10-CM

## 2024-10-05 NOTE — Progress Notes (Signed)
 "  [name], female    DOB: 11/08/1945   MRN: [id number]   Brief patient profile:  51  yowf  quit smoking 2010  referred to pulmonary clinic 06/18/2024 for post hosp f/u    Seen by PCCM in 05/2023 for CAP / resp failure   Admit date: 05/22/2024 Discharge date: 05/28/2024   Admitted From: Home Disposition: Home   Recommendations for Outpatient Follow-up:  Follow up with PCP in 1-2 weeks Please obtain BMP/CBC in one week your next doctors visit.  Augmentin  and p.o. doxycycline  prescribed 3 L of nasal cannula at home Outpatient repeat chest x-ray in 6-8 weeks   Discharge Condition: Stable CODE STATUS: Full Diet recommendation: Cardiac   Brief/Interim Summary: Brief Narrative:  78 year old with history of HTN, CHF, COPD/asthma, chronic pancreatitis, DM 2, depression, diabetes with neuropathy, PVD recently diagnosed with pneumonia a week ago and completed cefdinir.  Continued to have shortness of breath therefore admitted to the hospital.  Chest x-ray showed concerns of multifocal pneumonia therefore started on broad-spectrum antibiotics.  Also required diuretics during the hospitalization due to some signs of volume overload.  VQ scan was negative for PE and lower extremity Dopplers were negative for DVT.  Hospital course was complicated by AKI but improved with hydration. Eventually patient's respiratory status started improving.  With ambulation still required 3 L nasal cannula therefore this was arranged with home health services.  Patient today is medically stable to be discharged on p.o. antibiotics and follow-up outpatient with PCP.     Assessment & Plan:  Principal Problem:   Multifocal pneumonia   CKD (chronic kidney disease) stage 4, GFR 15-29 ml/min (HCC)   Chronic combined systolic and diastolic CHF (congestive heart failure) (HCC)   Dyslipidemia   Chronic depression   Iron  deficiency anemia, unspecified    Asthma   Chronic pancreatitis (HCC)   Type 2 diabetes mellitus  with diabetic peripheral angiopathy without gangrene (HCC)   PVD (peripheral vascular disease) (HCC)   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HCC)      Sepsis POA Multifocal pneumonia Acute hypoxic respiratory failure:  Recently completed antibiotics and failed outpatient treatment (  cefdinir) admitted with sepsis/multifocal pneumonia -remains hemodynamically stable leukocytosis resolved. Blood culture NGTD.  Urine antigen strep negative. MRSA PCR negative.  Leukocytosis have resolved. -Will transition patient to p.o. Augmentin  and doxycycline  to finish the course. -Ambulatory pulse ox showing hypoxia down to 78% on  RA, needing 3L    Hypomagnesemia - Repleted prior to discharge   D-dimer positive: VQ scan negative, bilateral lower extremity Dopplers negative for DVT   AKI on CKD stage 4 Metabolic acidosis: Creatinine peaked around 2.4, now stabilizing around baseline of 1.7   COPD Asthma: No wheezing, continue inhalers, Singulair .   Peripheral vascular disease Carotid artery disease Cont Plavix  and statin   Hypertension Chronic diastolic CHF, EF 44% Nonsustained V. tach Leg swelling worse from baseline Hypomagnesemia: Echo updated 8/20 EF normal 55-60, G1 DD RV size normal, MV-degenerative, AV not well-visualized.  Continue home Toprol , Lasix    Chronic knee pain and ambulatory dysfunction On Chronic norco: Continue hydrocodone  at lower dose. She chronically takes it As per daughter in law/son,  she is mostly bedbound by her choice but she is ambulatory  Cont PT OT    Chronic diarrhea Chronic pancreatitis: Cont lomotil  prn   Dyslipidemia cont Lipitor.   Chronic depression: Continue home doxepin , Remeron , Effexor    Chronic Rash/eczema: Appears generalized on extremities and back with excoriation, going on for  3 months. 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 off isolation.  Previous provider discussed with infectious disease   Seizure disorder: continue home Keppra    IDA/anemia of CKD: Hemoglobin around baseline 10.0   T2DM with diabetic peripheral angiopathy without gangrene: A1c 6.1.  Sliding scale and Accu-Cheks   History of Present Illness  06/18/2024  Pulmonary/ 1st office eval/[name]  Chief Complaint  Patient presents with   Consult    Pna   Dyspnea:  very  sedentary  / can't do mb and back flat s stopping on the way back to [place] due to R knee and breathing Cough: some every day but no pattern / sometimes brown but not bloody lateley on plavix  and asa  Sleep: flat on couch  SABA use: has one not using afraid of getting dep  02 use:has refillable system  3 lpm home conc refuses to wear  portable 02 or do monitor Patient Instructions  Make sure you check your oxygen saturation  AT  your highest level of activity (not after you stop)   to be sure it stays over 90%  For cough/ congestion > mucinex  or mucinex  dm  up to maximum of  1200 mg every 12 hours as needed  We will ask DME to supply you with humidity for your concentrator  We walk you today to see if you qualify for a Portable concentrator  Please schedule a follow up office visit in 8 weeks, sooner if needed  with all medications /inhalers/ solutions in hand  Lab Results  Component Value Date   ESRSEDRATE 78 (H) 06/18/2024    Cxr 06/18/24 1. Interstitial and airspace infiltrates throughout the right lung, more focal at the right lung base, likely infectious in the acute setting. Improved since prior examination but not yet completely resolved.    08/12/2024  f/u ov/[name] re: doe/ resp failure sp pna maint on prn saba   Chief Complaint  Patient presents with   Asthma   COPD    Coughing up sputum (yellow in color)  Dyspnea:  can do grocery store limited by knees but still  can do multiple aisles s 02   Cough: variable congested/ discolreoed   Sleeping: 1  pillow  resp cc  02: none  Patient Instructions  Use 3lpm at bedtime and during the day if needed to keep the saturations above 90%  For cough/ congestion > mucinex  or mucinex  dm  up to maximum of  1200 mg every 12 hours as needed  Make sure you check your oxygen saturation  AT  your highest level of activity (not after you stop)   to be sure it stays over 90%  Please schedule a follow up office visit in 4 weeks, sooner if needed  with all medications /inhalers/ solutions in hand    10/05/2024  f/u ov/[name] re: DOE/ worsening CRI  -  maint on no resp rx   did  bring meds Chief Complaint  Patient presents with   Medical Management of Chronic Issues   COPD    Cough with brown sputum and sore throat x 1 wk.    Dyspnea:  pushing the cart at [employer] slow pace /gets let off at curb / not using 02 or checking sats as rec  Cough: better now  Sleeping: flat bed one pillow s  resp cc  SABA use: none  02: sometime  3 lpm   No obvious day to day or daytime variability or assoc excess/ purulent sputum or mucus plugs or hemoptysis or cp or chest tightness, subjective wheeze or overt sinus or hb symptoms.    Also denies any obvious fluctuation of symptoms with weather or environmental changes or other aggravating or alleviating factors except as outlined above   No unusual exposure hx or h/o childhood pna/ asthma or knowledge of premature birth.  Current Allergies, Complete Past Medical History, Past Surgical History, Family History, and Social History were reviewed in [employer] record.  ROS  The following are not active complaints unless bolded Hoarseness, sore throat, dysphagia/globus, dental problems, itching, sneezing,  nasal congestion or discharge of excess mucus or purulent secretions, ear ache,   fever, chills, sweats, unintended wt loss or wt gain, classically pleuritic or exertional cp,  orthopnea pnd or arm/hand swelling  or leg swelling, presyncope, palpitations,  abdominal pain, anorexia, nausea, vomiting, diarrhea  or change in bowel habits or change in bladder habits, change in stools or change in urine, dysuria, hematuria,  rash, arthralgias, visual complaints, headache, numbness, weakness or ataxia or problems with walking or coordination,  change in mood or  memory.          Outpatient Medications Prior to Visit  Medication Sig Dispense Refill   albuterol  (VENTOLIN  HFA) 108 (90 Base) MCG/ACT inhaler Inhale 1-2 puffs into the lungs every 6 (six) hours as needed for wheezing or shortness of breath.     amLODipine  (NORVASC ) 10 MG tablet Take 1 tablet (10 mg total) by mouth daily. 30 tablet 0   atorvastatin  (LIPITOR) 40 MG tablet Take 1 tablet (40 mg total) by mouth daily. 30 tablet 0   clopidogrel  (PLAVIX ) 75 MG tablet Take 1 tablet (75 mg total) by mouth daily. 30 tablet 0   diphenoxylate -atropine  (LOMOTIL ) 2.5-0.025 MG tablet Take 1 tablet by mouth 4 (four) times daily as needed.     fluticasone  (FLONASE ) 50 MCG/ACT nasal spray Place 2 sprays into both nostrils daily as needed for allergies.     furosemide  (LASIX ) 20 MG tablet Take 1 tablet (20 mg total) by mouth daily as needed. 30 tablet 0   HYDROcodone -acetaminophen  (NORCO) 10-325 MG tablet Take 1 tablet by mouth 4 (four) times daily as needed.     iron  polysaccharides (NIFEREX) 150 MG capsule Take 150 mg by mouth daily.     levETIRAcetam  (KEPPRA ) 500 MG tablet TAKE 1 TABLET BY MOUTH TWICE A DAY 180 tablet 2   meclizine  (ANTIVERT ) 25 MG tablet Take 0.5 tablets (12.5 mg total) by mouth 3 (three) times daily as needed for dizziness. 30 tablet 0   metoprolol  succinate (TOPROL -XL) 100 MG 24 hr tablet Take 100 mg by mouth daily.     mirtazapine  (REMERON ) 15 MG tablet Take 15 mg by mouth at bedtime.     montelukast  (SINGULAIR ) 10 MG tablet Take 10 mg by mouth daily as needed.     mupirocin  ointment (BACTROBAN ) 2 % Apply 1 Application topically 2 (two) times daily.     nitroGLYCERIN  (NITROSTAT ) 0.4 MG  SL tablet Place 1 tablet (0.4 mg total) under the tongue every 5 (five) minutes as needed for chest pain. 90 tablet 3   nystatin-triamcinolone  (MYCOLOG II) cream Apply 1 Application topically 2 (two) times daily.     ondansetron  (ZOFRAN ) 4 MG tablet Take 1 tablet (4 mg total) by mouth every 6 (six) hours as needed for nausea or vomiting. 40  tablet 1   Pancrelipase , Lip-Prot-Amyl, (ZENPEP ) 40000-126000 units CPEP Take 2 capsules by mouth 4 (four) times daily - after meals and at bedtime.     pantoprazole  (PROTONIX ) 40 MG tablet Take 1 tablet (40 mg total) by mouth 2 (two) times daily. 180 tablet 2   venlafaxine  XR (EFFEXOR -XR) 75 MG 24 hr capsule Take 1 capsule (75 mg total) by mouth daily.     vitamin B-12 (CYANOCOBALAMIN ) 500 MCG tablet Take 500 mcg by mouth daily.     Vitamin D , Ergocalciferol , (DRISDOL ) 1.25 MG (50000 UT) CAPS capsule Take 50,000 Units by mouth every Wednesday.      doxepin  (SINEQUAN ) 10 MG/ML solution Take 0.3 mLs by mouth at bedtime. (Patient not taking: Reported on 10/05/2024)     feeding supplement (ENSURE ENLIVE / ENSURE PLUS) LIQD Take 237 mLs by mouth 2 (two) times daily between meals. (Patient not taking: Reported on 10/05/2024)     cefdinir (OMNICEF) 300 MG capsule one capsule with food Orally twice a day; Duration: 10 days     doxycycline  (VIBRA -TABS) 100 MG tablet 1 tablet Orally twice a day; Duration: 10 days (Patient not taking: Reported on 09/06/2024)     ferrous gluconate  (FERGON) 324 MG tablet Take 1 tablet (324 mg total) by mouth daily with breakfast. 30 tablet 2   No facility-administered medications prior to visit.               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 known 60-70% mid-LAD stenosis with caths in 1999, 2002, 2006, and 2012 showing stable anatomy b. low-risk NST in  10/2015   Depression    Diabetes insipidus (HCC)    Diabetes mellitus    Diverticulosis    Dizziness    chronic   Dyslipidemia    Fatty liver    GERD (gastroesophageal reflux disease)    hiatial hernia   Heart murmur    Hiatal hernia    Hypertension    Irritable bowel syndrome    Pancreatitis, chronic (HCC)    Peripheral vascular disease (HCC) 02/20/2010   s/p stent of left SFA; carotid stenosis   Seizure (HCC) 11/04/2018   last 1 yr 2020 per pt unsure month    Sleep apnea    no cpap    Stroke (HCC)    Thyroid  nodule    Vitamin B12 deficiency       Objective:    10/05/2024     156   08/12/24 159 lb (72.1 kg)  06/18/24 162 lb (73.5 kg)  05/23/24 163 lb 5.8 oz (74.1 kg)    Vital signs reviewed  10/05/2024  - Note at rest 02 sats  97% on RA   General appearance:    somber wf/  mild rattle  when coughs    HEENT : Oropharynx  cleaar       NECK :  without  apparent JVD/ palpable Nodes/TM    LUNGS: no acc muscle use,  Nl contour chest which is clear to A and P bilaterally without cough on insp or exp maneuvers   CV:  RRR  no s3 or murmur or increase in P2, and trace bilateral LE edema  ABD:  soft and nontender   MS:  ext warm without deformities Or obvious joint restrictions  calf tenderness, cyanosis or clubbing    SKIN: warm and dry without lesions    NEURO:  alert, approp, nl sensorium with  no motor or cerebellar deficits apparent.        CXR PA and Lateral:   10/05/2024 :    I personally reviewed images and impression is as follows:     Further improvement aeration bilaterally - mod T Kyphosis  Assessment     Assessment & Plan Chronic respiratory failure with hypoxia (HCC) 02 dep at 3lpm hs and with walking as of 06/18/2024  - no longer using walking as of 08/12/2024 but due to issues with renal function and vol overload rec at least use 3lpm hs  - 10/05/2024 no desats walking x 250 ft RA - ONO RA 10/05/2024 >>>  Community acquired pneumonia of  right lower lobe of lung Resolved completely as of 10/05/2024   DOE (dyspnea on exertion) Quit smoking 2010 - PFT's  8/28//18  FEV1 1.73 (86 % ) ratio 0.83  p 0 % improvement from saba p 0 prior to study with DLCO  12.4 (61% %)   and FV curve nl  and ERV 6%  at wt 178   - Echo 05/23/24 Grade 1 diastolic dysfunction with nl LA/RA - VQ lung scan 05/23/24 neg for PE -06/18/2024  at wt 162  Walked on 3lpm  cont  x  3  lap(s) =  approx 750  ft  @ slow pace s desats  but could not maintain on POC @ 4lpm  - 08/12/2024 no longer using 02, walking aisles at grocery store   - 10/05/2024   Walked on RA  x  2  lap(s) =  approx 250  ft  @ avg  pace, stopped due to sob  with lowest 02 sats 93%   Likely still has doe related  to degenerative disc disease / CRI/ mild vol overload and deconditioning but no long desaturating with clear lungs on exam so pulmonary f/u can be prn     Each maintenance medication was reviewed in detail including emphasizing most importantly the difference between maintenance and prns and under what circumstances the prns are to be triggered using an action plan format where appropriate.  Total time for H and P, chart review, counseling, reviewing 02/pulse ox  device(s) , directly observing portions of ambulatory 02 saturation study/ and generating customized AVS unique to this office visit / same day charting = 31 min final summary f/u ov                AVS  Patient Instructions  Please remember to go to the  x-ray department  for your tests - we will call you with the results when they are available    My office will be contacting you by phone for referral for ONO on room air (no oxygen)   - if you don't hear back from my office within one week please call us  back or notify us  thru MyChart and we'll address it right away.   For cough / congestion >  mucinex  dm 600 mg every 6 hours as needed    Please remember to go to the  x-ray department  for your tests - we will call you  with the results when they are available    I will call you with the results of the cxr and the 0xygen levels to decide whether follow up here is  needed    Ozell America, MD 10/06/2024                       "

## 2024-10-05 NOTE — Patient Instructions (Addendum)
 Please remember to go to the  x-ray department  for your tests - we will call you with the results when they are available    My office will be contacting you by phone for referral for ONO on room air (no oxygen)   - if you don't hear back from my office within one week please call us  back or notify us  thru MyChart and we'll address it right away.   For cough / congestion >  mucinex  dm 600 mg every 6 hours as needed    Please remember to go to the  x-ray department  for your tests - we will call you with the results when they are available    I will call you with the results of the cxr and the 0xygen levels to decide whether follow up here is needed

## 2024-10-06 NOTE — Assessment & Plan Note (Addendum)
 Quit smoking 2010 - PFT's  8/28//18  FEV1 1.73 (86 % ) ratio 0.83  p 0 % improvement from saba p 0 prior to study with DLCO  12.4 (61% %)   and FV curve nl  and ERV 6%  at wt 178   - Echo 05/23/24 Grade 1 diastolic dysfunction with nl LA/RA - VQ lung scan 05/23/24 neg for PE -06/18/2024  at wt 162  Walked on 3lpm  cont  x  3  lap(s) =  approx 750  ft  @ slow pace s desats  but could not maintain on POC @ 4lpm  - 08/12/2024 no longer using 02, walking aisles at grocery store   - 10/05/2024   Walked on RA  x  2  lap(s) =  approx 250  ft  @ avg  pace, stopped due to sob  with lowest 02 sats 93%   Likely still has doe related  to degenerative disc disease / CRI/ mild vol overload and deconditioning but no long desaturating with clear lungs on exam so pulmonary f/u can be prn

## 2024-10-06 NOTE — Assessment & Plan Note (Addendum)
 02 dep at 3lpm hs and with walking as of 06/18/2024  - no longer using walking as of 08/12/2024 but due to issues with renal function and vol overload rec at least use 3lpm hs  - 10/05/2024 no desats walking x 250 ft RA - ONO RA 10/05/2024 >>>

## 2024-10-06 NOTE — Assessment & Plan Note (Signed)
 Resolved completely as of 10/05/2024

## 2024-10-08 ENCOUNTER — Telehealth (HOSPITAL_COMMUNITY): Payer: Self-pay

## 2024-10-08 NOTE — Telephone Encounter (Signed)
 Auth Submission: APPROVED Site of care: Site of care: [facility] Payer: [employer] Medication & CPT/J Code(s) submitted: Feraheme  (ferumoxytol ) [id number] Diagnosis Code: D50.9 Route of submission (phone, fax, portal): portal Phone # Fax # Auth type: Buy/Bill HB Units/visits requested: 510mg  x 2 doses Reference number: [id number] Approval from: 10/05/24 to 10/13/25   Approval letter has been scanned into patient's media tab.

## 2024-10-15 ENCOUNTER — Ambulatory Visit
Admission: RE | Admit: 2024-10-15 | Discharge: 2024-10-15 | Disposition: A | Discharge status: Home / Self Care | Source: Ambulatory Visit | Attending: Internal Medicine | Admitting: Internal Medicine

## 2024-10-15 VITALS — BP 151/70 | HR 65 | Temp 97.7°F | Resp 18 | Ht 63.0 in | Wt 156.4 lb

## 2024-10-15 DIAGNOSIS — D509 Iron deficiency anemia, unspecified: Secondary | ICD-10-CM | POA: Diagnosis present

## 2024-10-15 MED ORDER — SODIUM CHLORIDE 0.9 % IV SOLN
510.0000 mg | Freq: Once | INTRAVENOUS | Status: AC
  Administered 2024-10-15: 510 mg via INTRAVENOUS
  Filled 2024-10-15: qty 17

## 2024-10-22 ENCOUNTER — Ambulatory Visit: Payer: Self-pay | Admitting: Internal Medicine

## 2024-10-26 NOTE — Telephone Encounter (Signed)
 Pt advised, verbalized understanding. Nothing further needed

## 2024-11-03 ENCOUNTER — Inpatient Hospital Stay: Admission: RE | Admit: 2024-11-03 | Source: Ambulatory Visit

## 2024-11-03 NOTE — Progress Notes (Unsigned)
 "  Chief Complaint: Primary GI MD: Dr. Stacia  HPI:  *** is a  ***  who was referred to me by Yolande Toribio MATSU, MD for a complaint of *** .     Discussed the use of AI scribe software for clinical note transcription with the patient, who gave verbal consent to proceed.  History of Present Illness      PREVIOUS GI WORKUP     Past Medical History:  Diagnosis Date   Anxiety    Arthritis    back    Blind loop syndrome    Clotting disorder    Hx Clot in leg per pt    Colostomy present [facility])    Coronary artery disease    a. known 60-70% mid-LAD stenosis with caths in 1999, 2002, 2006, and 2012 showing stable anatomy b. low-risk NST in 10/2015   Depression    Diabetes insipidus    Diabetes mellitus    Diverticulosis    Dizziness    chronic   Dyslipidemia    Fatty liver    GERD (gastroesophageal reflux disease)    hiatial hernia   Heart murmur    Hiatal hernia    Hypertension    Irritable bowel syndrome    Pancreatitis, chronic (HCC)    Peripheral vascular disease 02/20/2010   s/p stent of left SFA; carotid stenosis   Seizure (HCC) 11/04/2018   last 1 yr 2020 per pt unsure month    Sleep apnea    no cpap    Stroke (HCC)    Thyroid  nodule    Vitamin B12 deficiency     Past Surgical History:  Procedure Laterality Date   ABDOMINAL HYSTERECTOMY     partial   AUGMENTATION MAMMAPLASTY     bilateral 1976 retro    BREAST ENHANCEMENT SURGERY     CARDIAC CATHETERIZATION  07/07/98;08/31/01;03/04/05   60 -70% LAD   CATARACT EXTRACTION Bilateral    COLON RESECTION N/A 10/14/2017   Procedure: EXPLORATORY LAPAROTOMY, EXTENDED RIGHT COLECTOMY; APPLICATION OF ABDOMINAL VACUUM DRESSING;  Surgeon: Curvin Deward MOULD, MD;  Location: [facility];  Service: General;  Laterality: N/A;   COLONOSCOPY     CORONARY ATHERECTOMY N/A 02/12/2023   Procedure: CORONARY ATHERECTOMY;  Surgeon: Jordan, Peter M, MD;  Location: [facility] IN[facility]AS[facility];  Service: Cardiovascular;  Laterality: N/A;    CORONARY STENT INTERVENTION N/A 02/12/2023   Procedure: CORONARY STENT INTERVENTION;  Surgeon: Jordan, Peter M, MD;  Location: [facility] CV LAB;  Service: Cardiovascular;  Laterality: N/A;   CORONARY ULTRASOUND/IVUS N/A 02/12/2023   Procedure: Coronary Ultrasound/IVUS;  Surgeon: Jordan, Peter M, MD;  Location: [facility] INVASIVE CV LAB;  Service: Cardiovascular;  Laterality: N/A;   ENDOBRONCHIAL ULTRASOUND Bilateral 02/24/2017   Procedure: ENDOBRONCHIAL ULTRASOUND;  Surgeon: Shelah Lamar RAMAN, MD;  Location: [facility]OS[facility];  Service: Cardiopulmonary;  Laterality: Bilateral;   FEMORAL ARTERY STENT     Left leg   LAPAROTOMY N/A 10/16/2017   Procedure: EXPLORATORY LAPAROTOMY, PARTIAL OMENTECTOMY, RESECTION ISCHEMIC ILEUM 130CM, APPLICATION OF VAC ABDOMINAL DRESSING;  Surgeon: Eletha Boas, MD;  Location: [facility];  Service: General;  Laterality: N/A;   LAPAROTOMY N/A 10/18/2017   Procedure: RE-EXPLORATION OF ABDOMEN, ILEOSTOMY CREATION;  Surgeon: Aron Shoulders, MD;  Location: [facility];  Service: General;  Laterality: N/A;   LEFT HEART CATH AND CORONARY ANGIOGRAPHY N/A 01/29/2023   Procedure: LEFT HEART CATH AND CORONARY ANGIOGRAPHY;  Surgeon: Jordan, Peter M, MD;  Location: [facility] INV[facility] CV LAB;  Service: Cardiovascular;  Laterality: N/A;  LUNG SURGERY  03/2017   Benign polyps removed   PATELLA REALIGNMENT Left    POLYPECTOMY     SIGMOIDOSCOPY     TONSILLECTOMY     TRANSCAROTID ARTERY REVASCULARIZATION  Right 04/18/2022   Procedure: Transcarotid Artery Revascularization;  Surgeon: Lanis Fonda BRAVO, MD;  Location: [facility] OR;  Service: Vascular;  Laterality: Right;    Current Outpatient Medications  Medication Sig Dispense Refill   albuterol  (VENTOLIN  HFA) 108 (90 Base) MCG/ACT inhaler Inhale 1-2 puffs into the lungs every 6 (six) hours as needed for wheezing or shortness of breath.     amLODipine  (NORVASC ) 10 MG tablet Take 1 tablet (10 mg total) by mouth daily. 30 tablet 0   atorvastatin  (LIPITOR) 40 MG tablet Take  1 tablet (40 mg total) by mouth daily. 30 tablet 0   clopidogrel  (PLAVIX ) 75 MG tablet Take 1 tablet (75 mg total) by mouth daily. 30 tablet 0   diphenoxylate -atropine  (LOMOTIL ) 2.5-0.025 MG tablet Take 1 tablet by mouth 4 (four) times daily as needed.     doxepin  (SINEQUAN ) 10 MG/ML solution Take 0.3 mLs by mouth at bedtime. (Patient not taking: Reported on 10/05/2024)     feeding supplement (ENSURE ENLIVE / ENSURE PLUS) LIQD Take 237 mLs by mouth 2 (two) times daily between meals. (Patient not taking: Reported on 10/05/2024)     fluticasone  (FLONASE ) 50 MCG/ACT nasal spray Place 2 sprays into both nostrils daily as needed for allergies.     furosemide  (LASIX ) 20 MG tablet Take 1 tablet (20 mg total) by mouth daily as needed. 30 tablet 0   HYDROcodone -acetaminophen  (NORCO) 10-325 MG tablet Take 1 tablet by mouth 4 (four) times daily as needed.     iron  polysaccharides (NIFEREX) 150 MG capsule Take 150 mg by mouth daily.     levETIRAcetam  (KEPPRA ) 500 MG tablet TAKE 1 TABLET BY MOUTH TWICE A DAY 180 tablet 2   meclizine  (ANTIVERT ) 25 MG tablet Take 0.5 tablets (12.5 mg total) by mouth 3 (three) times daily as needed for dizziness. 30 tablet 0   metoprolol  succinate (TOPROL -XL) 100 MG 24 hr tablet Take 100 mg by mouth daily.     mirtazapine  (REMERON ) 15 MG tablet Take 15 mg by mouth at bedtime.     montelukast  (SINGULAIR ) 10 MG tablet Take 10 mg by mouth daily as needed.     mupirocin  ointment (BACTROBAN ) 2 % Apply 1 Application topically 2 (two) times daily.     nitroGLYCERIN  (NITROSTAT ) 0.4 MG SL tablet Place 1 tablet (0.4 mg total) under the tongue every 5 (five) minutes as needed for chest pain. 90 tablet 3   nystatin-triamcinolone  (MYCOLOG II) cream Apply 1 Application topically 2 (two) times daily.     ondansetron  (ZOFRAN ) 4 MG tablet Take 1 tablet (4 mg total) by mouth every 6 (six) hours as needed for nausea or vomiting. 40 tablet 1   Pancrelipase , Lip-Prot-Amyl, (ZENPEP ) 40000-126000 units  CPEP Take 2 capsules by mouth 4 (four) times daily - after meals and at bedtime.     pantoprazole  (PROTONIX ) 40 MG tablet Take 1 tablet (40 mg total) by mouth 2 (two) times daily. 180 tablet 2   venlafaxine  XR (EFFEXOR -XR) 75 MG 24 hr capsule Take 1 capsule (75 mg total) by mouth daily.     vitamin B-12 (CYANOCOBALAMIN ) 500 MCG tablet Take 500 mcg by mouth daily.     Vitamin D , Ergocalciferol , (DRISDOL ) 1.25 MG (50000 UT) CAPS capsule Take 50,000 Units by mouth every Wednesday.  No current facility-administered medications for this visit.    Allergies as of 11/04/2024 - Unable to Assess 10/15/2024  Allergen Reaction Noted   Adhesive [tape] Other (See Comments) 04/09/2018   Other Rash 04/09/2018   Ace inhibitors Other (See Comments) 02/21/2021   Clonidine   06/15/2023   Sulfa antibiotics Other (See Comments) 02/21/2021   Topiramate  02/21/2021   Codeine Other (See Comments) 11/08/2010   Iodinated contrast media Rash 11/08/2010   Latex Rash 01/02/2023    Family History  Problem Relation Age of Onset   Hypertension Mother    Osteoarthritis Mother    Pulmonary embolism Mother    Hypertension Father        aneurysm   Stroke Father    Colon cancer Paternal Grandfather    Stroke Other    Breast cancer Neg Hx    Colon polyps Neg Hx    Esophageal cancer Neg Hx    Rectal cancer Neg Hx    Stomach cancer Neg Hx    Pancreatic cancer Neg Hx    Liver disease Neg Hx     Social History   Socioeconomic History   Marital status: Divorced    Spouse name: Not on file   Number of children: 2   Years of education: 10   Highest education level: Not on file  Occupational History   Occupation: retired    Associate Professor: DISABLED  Tobacco Use   Smoking status: Former    Current packs/day: 0.00    Average packs/day: 1 pack/day for 50.0 years (50.0 ttl pk-yrs)    Types: Cigarettes    Start date: 10/14/1960    Quit date: 10/14/2010    Years since quitting: 14.0    Passive exposure: Past    Smokeless tobacco: Never  Vaping Use   Vaping status: Never Used  Substance and Sexual Activity   Alcohol  use: No   Drug use: No   Sexual activity: Not Currently    Comment: Hysterectomy  Other Topics Concern   Not on file  Social History Narrative   Right handed   Drinks caffein prn   One floor home   Social Drivers of Health   Tobacco Use: Medium Risk (10/15/2024)   Patient History    Smoking Tobacco Use: Former    Smokeless Tobacco Use: Never    Passive Exposure: Past  Physicist, Medical Strain: Not on file  Food Insecurity: No Food Insecurity (05/23/2024)   Epic    Worried About Programme Researcher, Broadcasting/film/video in the Last Year: Never true    [employer] of Food in the Last Year: Never true  Transportation Needs: No Transportation Needs (05/23/2024)   Epic    Lack of Transportation (Medical): No    Lack of Transportation (Non-Medical): No  Physical Activity: Not on file  Stress: Not on file  Social Connections: Unknown (05/23/2024)   Social Connection and Isolation Panel    Frequency of Communication with Friends and Family: More than three times a week    Frequency of Social Gatherings with Friends and Family: More than three times a week    Attends Religious Services: Patient declined    Database Administrator or Organizations: Patient declined    Attends Banker Meetings: Patient declined    Marital Status: Patient declined  Intimate Partner Violence: Not At Risk (05/23/2024)   Epic    Fear of Current or Ex-Partner: No    Emotionally Abused: No    Physically Abused: No  Sexually Abused: No  Depression (PHQ2-9): Not on file  Alcohol  Screen: Not on file  Housing: Patient Declined (05/23/2024)   Epic    Unable to Pay for Housing in the Last Year: Patient declined    Number of Times Moved in the Last Year: Not on file    Homeless in the Last Year: Patient declined  Utilities: Not At Risk (05/23/2024)   Epic    Threatened with loss of utilities: No  Health Literacy:  Not on file    Review of Systems:    Constitutional: No weight loss, fever, chills, weakness or fatigue HEENT: Eyes: No change in vision               Ears, Nose, Throat:  No change in hearing or congestion Skin: No rash or itching Cardiovascular: No chest pain, chest pressure or palpitations   Respiratory: No SOB or cough Gastrointestinal: See HPI and otherwise negative Genitourinary: No dysuria or change in urinary frequency Neurological: No headache, dizziness or syncope Musculoskeletal: No new muscle or joint pain Hematologic: No bleeding or bruising Psychiatric: No history of depression or anxiety    Physical Exam:  Vital signs: There were no vitals taken for this visit.  Constitutional: NAD, alert and cooperative Head:  Normocephalic and atraumatic. Eyes:   PEERL, EOMI. No icterus. Conjunctiva pink. Respiratory: Respirations even and unlabored. Lungs clear to auscultation bilaterally.   No wheezes, crackles, or rhonchi.  Cardiovascular:  Regular rate and rhythm. No peripheral edema, cyanosis or pallor.  Gastrointestinal:  Soft, nondistended, nontender. No rebound or guarding. Normal bowel sounds. No appreciable masses or hepatomegaly. Rectal:  Declines Msk:  Symmetrical without gross deformities. Without edema, no deformity or joint abnormality.  Neurologic:  Alert and  oriented x4;  grossly normal neurologically.  Skin:   Dry and intact without significant lesions or rashes. Psychiatric: Oriented to person, place and time. Demonstrates good judgement and reason without abnormal affect or behaviors.  Physical Exam    RELEVANT LABS AND IMAGING: CBC    Component Value Date/Time   WBC 9.6 08/12/2024 1342   RBC 3.83 (L) 08/12/2024 1342   HGB 11.1 (L) 08/12/2024 1342   HGB 12.1 02/06/2023 1345   HCT 34.3 (L) 08/12/2024 1342   HCT 37.9 02/06/2023 1345   PLT 237.0 08/12/2024 1342   PLT 251 02/06/2023 1345   MCV 89.6 08/12/2024 1342   MCV 94 02/06/2023 1345   MCH  27.6 05/28/2024 0846   MCHC 32.2 08/12/2024 1342   RDW 16.6 (H) 08/12/2024 1342   RDW 12.8 02/06/2023 1345   LYMPHSABS 2.1 08/12/2024 1342   MONOABS 0.8 08/12/2024 1342   EOSABS 0.3 08/12/2024 1342   BASOSABS 0.1 08/12/2024 1342    CMP     Component Value Date/Time   NA 137 08/12/2024 1342   NA 141 02/18/2023 1449   K 4.6 08/12/2024 1342   CL 102 08/12/2024 1342   CO2 24 08/12/2024 1342   GLUCOSE 96 08/12/2024 1342   BUN 27 (H) 08/12/2024 1342   BUN 16 02/18/2023 1449   CREATININE 2.50 (H) 08/12/2024 1342   CALCIUM  9.0 08/12/2024 1342   PROT 7.2 08/12/2024 1342   ALBUMIN  3.8 08/12/2024 1342   AST 25 08/12/2024 1342   ALT 18 08/12/2024 1342   ALKPHOS 118 (H) 08/12/2024 1342   BILITOT 0.5 08/12/2024 1342   GFRNONAA 31 (L) 05/28/2024 0846   GFRAA 30 (L) 10/21/2019 1904     Assessment/Plan:   Assessment and Plan Assessment &  Plan        Aven Cegielski Mollie RIGGERS [facility] 11/03/2024, 8:29 PM  Cc: Yolande Toribio MATSU, MD "

## 2024-11-04 ENCOUNTER — Ambulatory Visit
Admission: RE | Admit: 2024-11-04 | Discharge: 2024-11-04 | Disposition: A | Discharge status: Home / Self Care | Source: Ambulatory Visit | Attending: Gastroenterology | Admitting: Gastroenterology

## 2024-11-04 ENCOUNTER — Ambulatory Visit (INDEPENDENT_AMBULATORY_CARE_PROVIDER_SITE_OTHER): Admitting: Gastroenterology

## 2024-11-04 ENCOUNTER — Encounter: Payer: Self-pay | Admitting: Gastroenterology

## 2024-11-04 VITALS — BP 110/60 | HR 60 | Ht 63.0 in | Wt 159.0 lb

## 2024-11-04 DIAGNOSIS — R197 Diarrhea, unspecified: Secondary | ICD-10-CM

## 2024-11-04 DIAGNOSIS — R159 Full incontinence of feces: Secondary | ICD-10-CM | POA: Diagnosis not present

## 2024-11-04 DIAGNOSIS — Z860101 Personal history of adenomatous and serrated colon polyps: Secondary | ICD-10-CM | POA: Diagnosis not present

## 2024-11-04 DIAGNOSIS — K625 Hemorrhage of anus and rectum: Secondary | ICD-10-CM | POA: Diagnosis not present

## 2024-11-04 DIAGNOSIS — Z9049 Acquired absence of other specified parts of digestive tract: Secondary | ICD-10-CM | POA: Diagnosis not present

## 2024-11-04 DIAGNOSIS — K6289 Other specified diseases of anus and rectum: Secondary | ICD-10-CM

## 2024-11-04 DIAGNOSIS — R194 Change in bowel habit: Secondary | ICD-10-CM

## 2024-11-04 DIAGNOSIS — K219 Gastro-esophageal reflux disease without esophagitis: Secondary | ICD-10-CM

## 2024-11-04 DIAGNOSIS — I639 Cerebral infarction, unspecified: Secondary | ICD-10-CM

## 2024-11-04 DIAGNOSIS — D509 Iron deficiency anemia, unspecified: Secondary | ICD-10-CM

## 2024-11-04 DIAGNOSIS — I5042 Chronic combined systolic (congestive) and diastolic (congestive) heart failure: Secondary | ICD-10-CM

## 2024-11-04 DIAGNOSIS — N184 Chronic kidney disease, stage 4 (severe): Secondary | ICD-10-CM

## 2024-11-04 DIAGNOSIS — I5023 Acute on chronic systolic (congestive) heart failure: Secondary | ICD-10-CM

## 2024-11-04 NOTE — Patient Instructions (Signed)
 Your provider has requested that you go to the basement level for an X-Ray before leaving today. Press B on the elevator. X Ray is straight ahead  _______________________________________________________  If your blood pressure at your visit was 140/90 or greater, please contact your primary care physician to follow up on this.  _______________________________________________________  If you are age 78 or older, your body mass index should be between 23-30. Your Body mass index is 28.17 kg/m. If this is out of the aforementioned range listed, please consider follow up with your Primary Care Provider.  If you are age 82 or younger, your body mass index should be between 19-25. Your Body mass index is 28.17 kg/m. If this is out of the aformentioned range listed, please consider follow up with your Primary Care Provider.   ________________________________________________________  The [facility] providers would like to encourage you to use MYCHART to communicate with providers for non-urgent requests or questions.  Due to long hold times on the telephone, sending your provider a message by [facility] may be a faster and more efficient way to get a response.  Please allow 48 business hours for a response.  Please remember that this is for non-urgent requests.  _______________________________________________________  [facility] is using a team-based approach to care.  Your team is made up of your doctor and two to three APPS. Our APPS (Nurse Practitioners and Physician Assistants) work with your physician to ensure care continuity for you. They are fully qualified to address your health concerns and develop a treatment plan. They communicate directly with your gastroenterologist to care for you. Seeing the Advanced Practice Practitioners on your physician's team can help you by facilitating care more promptly, often allowing for earlier appointments, access to diagnostic testing, procedures,  and other specialty referrals.

## 2024-11-05 ENCOUNTER — Ambulatory Visit: Admission: RE | Admit: 2024-11-05

## 2024-11-06 NOTE — Progress Notes (Signed)
 Agree with the assessment and plan as outlined by Nestor Blower, PA-C.  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  Would readdress details of bile acid binder trial.  It may have been patient was not taking adequate dose.  May benefit from combination of bile acid binders and loperamide  /lomotil .

## 2024-11-09 ENCOUNTER — Inpatient Hospital Stay: Admission: RE | Admit: 2024-11-09

## 2024-11-09 ENCOUNTER — Ambulatory Visit: Payer: Self-pay | Admitting: Gastroenterology

## 2024-11-10 ENCOUNTER — Ambulatory Visit
Admission: RE | Admit: 2024-11-10 | Discharge: 2024-11-10 | Disposition: A | Discharge status: Home / Self Care | Source: Ambulatory Visit | Attending: Internal Medicine | Admitting: Internal Medicine

## 2024-11-10 VITALS — BP 169/61 | HR 56 | Temp 98.6°F | Resp 17 | Ht 63.0 in | Wt 150.0 lb

## 2024-11-10 DIAGNOSIS — D509 Iron deficiency anemia, unspecified: Secondary | ICD-10-CM | POA: Diagnosis present

## 2024-11-10 MED ORDER — SODIUM CHLORIDE 0.9 % IV SOLN
510.0000 mg | Freq: Once | INTRAVENOUS | Status: AC
  Administered 2024-11-10: 510 mg via INTRAVENOUS
  Filled 2024-11-10: qty 17

## 2024-11-24 ENCOUNTER — Institutional Professional Consult (permissible substitution): Admitting: Psychology

## 2024-11-24 ENCOUNTER — Ambulatory Visit: Payer: Self-pay

## 2024-12-01 ENCOUNTER — Encounter: Admitting: Psychology

## 2025-03-08 ENCOUNTER — Ambulatory Visit: Admitting: Physician Assistant
